# Patient Record
Sex: Female | Born: 1935
Health system: Southern US, Community
[De-identification: ages and names within clinical notes are randomized; demographics above are authoritative.]

## PROBLEM LIST (undated history)

## (undated) DIAGNOSIS — I10 Essential (primary) hypertension: Secondary | ICD-10-CM

## (undated) DIAGNOSIS — E785 Hyperlipidemia, unspecified: Secondary | ICD-10-CM

## (undated) DIAGNOSIS — K5792 Diverticulitis of intestine, part unspecified, without perforation or abscess without bleeding: Secondary | ICD-10-CM

## (undated) DIAGNOSIS — Z5189 Encounter for other specified aftercare: Secondary | ICD-10-CM

## (undated) DIAGNOSIS — F09 Unspecified mental disorder due to known physiological condition: Secondary | ICD-10-CM

## (undated) DIAGNOSIS — Z9889 Other specified postprocedural states: Secondary | ICD-10-CM

## (undated) DIAGNOSIS — E119 Type 2 diabetes mellitus without complications: Secondary | ICD-10-CM

## (undated) DIAGNOSIS — R54 Age-related physical debility: Secondary | ICD-10-CM

## (undated) HISTORY — DX: Hyperlipidemia, unspecified: E78.5

## (undated) HISTORY — PX: ABDOMINAL HYSTERECTOMY: SHX81

## (undated) HISTORY — PX: BREAST REDUCTION SURGERY: SHX8

## (undated) HISTORY — PX: REFRACTIVE SURGERY: SHX103

## (undated) HISTORY — DX: Diverticulitis of intestine, part unspecified, without perforation or abscess without bleeding: K57.92

## (undated) HISTORY — PX: KNEE SURGERY: SHX244

## (undated) HISTORY — PX: COLON SURGERY: SHX602

## (undated) HISTORY — PX: APPENDECTOMY: SHX54

## (undated) HISTORY — DX: Essential (primary) hypertension: I10

---
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
## 1998-01-23 ENCOUNTER — Ambulatory Visit (HOSPITAL_COMMUNITY): Admission: RE | Admit: 1998-01-23 | Discharge: 1998-01-23 | Payer: Self-pay | Admitting: *Deleted

## 1998-01-26 ENCOUNTER — Ambulatory Visit (HOSPITAL_COMMUNITY): Admission: RE | Admit: 1998-01-26 | Discharge: 1998-01-26 | Payer: Self-pay | Admitting: *Deleted

## 1999-07-02 HISTORY — PX: REDUCTION MAMMAPLASTY: SUR839

## 2000-01-23 ENCOUNTER — Other Ambulatory Visit: Admission: RE | Admit: 2000-01-23 | Discharge: 2000-01-23 | Payer: Self-pay | Admitting: Obstetrics and Gynecology

## 2000-02-11 ENCOUNTER — Encounter: Admission: RE | Admit: 2000-02-11 | Discharge: 2000-02-11 | Payer: Self-pay | Admitting: Surgery

## 2000-02-11 ENCOUNTER — Encounter: Payer: Self-pay | Admitting: Surgery

## 2000-05-13 ENCOUNTER — Encounter: Admission: RE | Admit: 2000-05-13 | Discharge: 2000-05-13 | Payer: Self-pay | Admitting: Surgery

## 2000-05-13 ENCOUNTER — Encounter: Payer: Self-pay | Admitting: Surgery

## 2001-05-18 ENCOUNTER — Encounter: Admission: RE | Admit: 2001-05-18 | Discharge: 2001-05-18 | Payer: Self-pay | Admitting: Surgery

## 2001-05-18 ENCOUNTER — Encounter: Payer: Self-pay | Admitting: Surgery

## 2001-05-25 ENCOUNTER — Ambulatory Visit (HOSPITAL_COMMUNITY): Admission: RE | Admit: 2001-05-25 | Discharge: 2001-05-25 | Payer: Self-pay | Admitting: Surgery

## 2001-07-01 HISTORY — PX: ABDOMINAL HYSTERECTOMY: SHX81

## 2001-07-01 HISTORY — PX: KNEE SURGERY: SHX244

## 2001-07-14 ENCOUNTER — Other Ambulatory Visit: Admission: RE | Admit: 2001-07-14 | Discharge: 2001-07-14 | Payer: Self-pay | Admitting: Obstetrics and Gynecology

## 2001-12-28 ENCOUNTER — Encounter: Payer: Self-pay | Admitting: Surgery

## 2001-12-28 ENCOUNTER — Encounter: Admission: RE | Admit: 2001-12-28 | Discharge: 2001-12-28 | Payer: Self-pay | Admitting: Surgery

## 2002-06-07 ENCOUNTER — Encounter: Payer: Self-pay | Admitting: Surgery

## 2002-06-07 ENCOUNTER — Encounter: Admission: RE | Admit: 2002-06-07 | Discharge: 2002-06-07 | Payer: Self-pay | Admitting: Surgery

## 2003-05-31 ENCOUNTER — Inpatient Hospital Stay (HOSPITAL_COMMUNITY): Admission: AD | Admit: 2003-05-31 | Discharge: 2003-06-01 | Payer: Self-pay | Admitting: Cardiovascular Disease

## 2003-06-16 ENCOUNTER — Encounter: Admission: RE | Admit: 2003-06-16 | Discharge: 2003-06-16 | Payer: Self-pay | Admitting: Surgery

## 2004-03-16 ENCOUNTER — Encounter: Admission: RE | Admit: 2004-03-16 | Discharge: 2004-03-16 | Payer: Self-pay | Admitting: Surgery

## 2004-07-20 ENCOUNTER — Encounter: Admission: RE | Admit: 2004-07-20 | Discharge: 2004-07-20 | Payer: Self-pay | Admitting: Surgery

## 2004-12-09 ENCOUNTER — Inpatient Hospital Stay (HOSPITAL_COMMUNITY): Admission: EM | Admit: 2004-12-09 | Discharge: 2004-12-13 | Payer: Self-pay | Admitting: *Deleted

## 2005-01-21 ENCOUNTER — Encounter: Admission: RE | Admit: 2005-01-21 | Discharge: 2005-01-21 | Payer: Self-pay | Admitting: Surgery

## 2005-02-22 ENCOUNTER — Inpatient Hospital Stay (HOSPITAL_COMMUNITY): Admission: RE | Admit: 2005-02-22 | Discharge: 2005-03-05 | Payer: Self-pay | Admitting: Surgery

## 2005-02-22 ENCOUNTER — Encounter (INDEPENDENT_AMBULATORY_CARE_PROVIDER_SITE_OTHER): Payer: Self-pay | Admitting: Specialist

## 2005-08-06 ENCOUNTER — Encounter: Admission: RE | Admit: 2005-08-06 | Discharge: 2005-08-06 | Payer: Self-pay | Admitting: Family Medicine

## 2005-08-13 ENCOUNTER — Ambulatory Visit (HOSPITAL_BASED_OUTPATIENT_CLINIC_OR_DEPARTMENT_OTHER): Admission: RE | Admit: 2005-08-13 | Discharge: 2005-08-13 | Payer: Self-pay | Admitting: Obstetrics and Gynecology

## 2005-08-23 ENCOUNTER — Encounter: Admission: RE | Admit: 2005-08-23 | Discharge: 2005-08-23 | Payer: Self-pay | Admitting: Family Medicine

## 2005-11-15 ENCOUNTER — Encounter: Admission: RE | Admit: 2005-11-15 | Discharge: 2005-11-15 | Payer: Self-pay | Admitting: Surgery

## 2006-02-01 ENCOUNTER — Inpatient Hospital Stay (HOSPITAL_COMMUNITY): Admission: EM | Admit: 2006-02-01 | Discharge: 2006-02-05 | Payer: Self-pay | Admitting: Emergency Medicine

## 2006-02-27 ENCOUNTER — Encounter: Admission: RE | Admit: 2006-02-27 | Discharge: 2006-02-27 | Payer: Self-pay | Admitting: Gastroenterology

## 2006-06-09 ENCOUNTER — Ambulatory Visit (HOSPITAL_COMMUNITY): Admission: RE | Admit: 2006-06-09 | Discharge: 2006-06-09 | Payer: Self-pay | Admitting: Obstetrics and Gynecology

## 2006-06-12 ENCOUNTER — Ambulatory Visit (HOSPITAL_COMMUNITY): Admission: RE | Admit: 2006-06-12 | Discharge: 2006-06-12 | Payer: Self-pay | Admitting: Family Medicine

## 2006-08-21 ENCOUNTER — Encounter: Admission: RE | Admit: 2006-08-21 | Discharge: 2006-08-21 | Payer: Self-pay | Admitting: Surgery

## 2007-02-23 ENCOUNTER — Inpatient Hospital Stay (HOSPITAL_COMMUNITY): Admission: RE | Admit: 2007-02-23 | Discharge: 2007-02-28 | Payer: Self-pay | Admitting: Surgery

## 2007-02-23 HISTORY — PX: LAPAROSCOPIC INCISIONAL / UMBILICAL / VENTRAL HERNIA REPAIR: SUR789

## 2007-09-09 ENCOUNTER — Ambulatory Visit (HOSPITAL_COMMUNITY): Admission: RE | Admit: 2007-09-09 | Discharge: 2007-09-09 | Payer: Self-pay | Admitting: Family Medicine

## 2008-09-21 ENCOUNTER — Ambulatory Visit (HOSPITAL_COMMUNITY): Admission: RE | Admit: 2008-09-21 | Discharge: 2008-09-21 | Payer: Self-pay | Admitting: Family Medicine

## 2008-10-10 ENCOUNTER — Ambulatory Visit (HOSPITAL_COMMUNITY): Admission: RE | Admit: 2008-10-10 | Discharge: 2008-10-10 | Payer: Self-pay | Admitting: Family Medicine

## 2009-03-30 HISTORY — PX: EYE SURGERY: SHX253

## 2009-07-01 HISTORY — PX: COLON SURGERY: SHX602

## 2009-09-25 ENCOUNTER — Ambulatory Visit (HOSPITAL_COMMUNITY)
Admission: RE | Admit: 2009-09-25 | Discharge: 2009-09-25 | Payer: Self-pay | Source: Home / Self Care | Admitting: Family Medicine

## 2010-05-07 ENCOUNTER — Ambulatory Visit: Payer: Self-pay | Admitting: Cardiology

## 2010-07-22 ENCOUNTER — Encounter: Payer: Self-pay | Admitting: Family Medicine

## 2010-07-25 ENCOUNTER — Ambulatory Visit (HOSPITAL_COMMUNITY)
Admission: RE | Admit: 2010-07-25 | Discharge: 2010-07-25 | Payer: Self-pay | Source: Home / Self Care | Attending: Family Medicine | Admitting: Family Medicine

## 2010-09-07 ENCOUNTER — Other Ambulatory Visit: Payer: Self-pay | Admitting: Cardiology

## 2010-09-07 ENCOUNTER — Ambulatory Visit (INDEPENDENT_AMBULATORY_CARE_PROVIDER_SITE_OTHER): Payer: Medicare Other | Admitting: Cardiology

## 2010-09-07 DIAGNOSIS — I119 Hypertensive heart disease without heart failure: Secondary | ICD-10-CM

## 2010-09-07 DIAGNOSIS — Z79899 Other long term (current) drug therapy: Secondary | ICD-10-CM

## 2010-09-07 DIAGNOSIS — E789 Disorder of lipoprotein metabolism, unspecified: Secondary | ICD-10-CM

## 2010-09-07 LAB — LIPID PANEL
Cholesterol: 181 mg/dL (ref 0–200)
HDL: 43 mg/dL (ref >39)
Total CHOL/HDL Ratio: 4.2 Ratio

## 2010-09-07 LAB — COMPREHENSIVE METABOLIC PANEL
AST: 22 U/L (ref 0–37)
Alkaline Phosphatase: 96 U/L (ref 39–117)
BUN: 27 mg/dL — ABNORMAL HIGH (ref 6–23)
Glucose, Bld: 122 mg/dL — ABNORMAL HIGH (ref 70–99)
Sodium: 137 mEq/L (ref 135–145)
Total Bilirubin: 0.8 mg/dL (ref 0.3–1.2)
Total Protein: 7.5 g/dL (ref 6.0–8.3)

## 2010-09-28 ENCOUNTER — Telehealth: Payer: Self-pay | Admitting: Cardiology

## 2010-09-28 NOTE — Telephone Encounter (Signed)
Lab results given and mailed to patient

## 2010-09-28 NOTE — Telephone Encounter (Signed)
PT SAID NO ONE CALLED HER WITH LAB RESULTS FROM 09/07/10. PLACED CHART IN  YOUR BOX.

## 2010-10-09 ENCOUNTER — Other Ambulatory Visit (HOSPITAL_COMMUNITY): Payer: Self-pay | Admitting: Family Medicine

## 2010-10-09 DIAGNOSIS — Z139 Encounter for screening, unspecified: Secondary | ICD-10-CM

## 2010-10-18 ENCOUNTER — Ambulatory Visit (HOSPITAL_COMMUNITY)
Admission: RE | Admit: 2010-10-18 | Discharge: 2010-10-18 | Disposition: A | Discharge status: Home / Self Care | Payer: Medicare Other | Source: Ambulatory Visit | Attending: Family Medicine | Admitting: Family Medicine

## 2010-10-18 DIAGNOSIS — Z139 Encounter for screening, unspecified: Secondary | ICD-10-CM

## 2010-10-18 DIAGNOSIS — Z1231 Encounter for screening mammogram for malignant neoplasm of breast: Secondary | ICD-10-CM | POA: Insufficient documentation

## 2010-11-13 NOTE — Op Note (Signed)
[name], [name]               ACCOUNT NO.:  [id number]   MEDICAL RECORD NO.:  [id number]          PATIENT TYPE:  INP   LOCATION:  0007                         FACILITY:  [facility]   PHYSICIAN:  Sandria Bales. Ezzard Standing, M.D.  DATE OF BIRTH:  1931-11-08   DATE OF PROCEDURE:  02/23/2007  DATE OF DISCHARGE:                               OPERATIVE REPORT   PREOPERATIVE DIAGNOSIS:  Ventral incisional hernia.   POSTOPERATIVE DIAGNOSIS:  Two Ventral incisional hernias,  periumbilical  incision, 3 x 3 cm, and an infra-abdominal incision, 8 x 12 cm in size.  Multiple intraabdominal adhesions.   PROCEDURE:  Laparoscopic ventral hernia repair with a 20 x 25-cm piece  of Parietex mesh and adhesiolysis for about 45 minutes.   SURGEON:  Sandria Bales. Ezzard Standing, M.D.   FIRST ASSISTANT:  No first assistant.   ANESTHESIA:  General.   ESTIMATED BLOOD LOSS:  Minimal.   INDICATION FOR PROCEDURE:  [name] is a 77 year old white female who  is a patient of Dr. Lilyan Punt, who has had a sigmoid colectomy in  October 2006.  Unfortunately, she developed a postop wound infection and  subsequently developed a lower abdominal hernia and now comes for repair  of this abdominal hernia.  Potential complications include bleeding,  infection, nerve injury or recurrence of the hernia and I have been  through all this with her.  She also has a sister who died form a DVT  post knee surgery and we will make sure she is covered perioperative  with compression stockings and subcu heparin.   OPERATIVE NOTE:  The patient was placed in a supine position and given a  general endotracheal anesthetic.  She had PAS stockings in place, a  Foley catheter in place.  Her abdomen was prepped with Betadine solution  and sterilely draped.   A timeout was held to identify the patient and the procedure and she was  given a gram of Ancef at the initiation of the procedure.   The abdomen was accessed through the right upper quadrant with  an  Optiview trocar entered through the abdominal cavity without difficulty.  The abdomen was insufflated.  Three additional trocars were placed, two  5-mm on the right side and one on the left side.   The patient had omentum and bowel stuck through her anterior abdominal  wall and hernia and I probably spent about 45 minutes dissecting these  adhesions and taking them down.   I used only a knife without any hot cautery.  I visualized the bowel  after I had done the dissection and saw no evidence of any injury to the  small bowel which had been stuck up.  I did take photos and placed these  in the chart.   I then identified basically 2 hernia defects; she had 1 periumbilical  hernia, which was about 2.5 or 3 cm in diameter; this was a more typical  hernia with clean margins.  The lower part of her abdomen has more of  almost a diastasis of the fascia, but I think it was a true hernia; it  probably had about a length of about 12 cm and a width anywhere from 6-8  cm.   I then measured what I thought would be appropriate mesh to cover this  and it looked like I needed a mesh about 17 cm wide and probably about  25 cm long to fill this defect and I used the Parietex 20 x 25 cm.   I put 8 sutures in a clockwise fashion as holding sutures and actually  put 2 addtional sutures on the lower end because the hernia was more low-  based, so I used a total of 10 retention or holding sutures and I placed  the Parietex into the abdominal cavity.  I placed saline on the mesh  before placing in the abdominal cavity and oriented the cranial and  caudad portions.  I then used the EndoCatch suture passer to grab each  such through the abdominal wall and this was a 0 Novofil that I had tied  along the edge and I tied these down.  I then used a tacker and tacked  along the edges of the mesh.  I used actually a total of some 55 tacks.  I went along the edge of the mesh about every centimeter and I also   placed maybe half a dozen, 6-8 tacks more in the center to kind of hold  up this mesh to the anterior abdominal wall.  The mesh appeared to lay  flat, but I thought I had the hernias well covered.  I thought the edges  of the mesh were covered to prevent any hernia defects or defects around  the edge of the mesh.  I think desufflated the abdomen once and then  reinflated to make sure there was nothing that either bled or looks  unusual in the way it buckled or laid, which I did not think did.  I  then desufflated again and removed by trocars under direct  visualization.  The skin at each trocar site was closed with a 5-0  Monocryl suture.  I painted each wound with tincture of Benzoin and  steri-stripped it so she had 10 puncture wounds around the edge of the  mesh and then 4 additional incisions for her trocars.   She tolerated the procedure well.  We plan at least overnight  observation, again, depending on how she does.      Sandria Bales. Ezzard Standing, M.D.  Electronically Signed     DHN/MEDQ  D:  02/23/2007  T:  02/24/2007  Job:  [id number]   cc:   Lorin Picket A. Gerda Diss, MD  Fax: [phone]

## 2010-11-16 NOTE — H&P (Signed)
NAME:  [name], [name] NO.:  [id number]   MEDICAL RECORD NO.:  [id number]                   PATIENT TYPE:  INP   LOCATION:  4703                                 FACILITY:  [facility]   PHYSICIAN:  Vesta Mixer, M.D.              DATE OF BIRTH:  24-Sep-1931   DATE OF ADMISSION:  05/31/2003  DATE OF DISCHARGE:                                HISTORY & PHYSICAL   HISTORY OF PRESENT ILLNESS:  [name] is a 77 year old female with a recent  onset of hypertension.  She presented this morning with episodes of chest  pain, chest discomfort, and tachy palpitations.   [name] was previously seen by Dr. Patty Sermons several weeks ago.  She has  had newly diagnosed hypertension which has only been intermittently  controlled.  Yesterday she called saying that her blood pressure was quite  high.  We gave her some extra medicines and her blood pressure came down  slightly.  This morning, she has some episodes of chest tightness as well as  some chest palpitations.  She presented to the office and her symptoms were  largely relieved with sublingual nitroglycerin.  She is admitted for further  evaluation.   [name] has not had any specific cardiac problems.  She has never had any  symptoms of hypertension, and her blood pressure was only found to be  elevated several weeks ago.  She also admits that her glucose levels have  been high on occasion, but she has never been formerly diagnosed as having  diabetes mellitus.  She denies any previous episodes of chest pain or  shortness of breath.  She denies any PND or orthopnea.  She denies any  fever, cough, or sputum production.  She denies any PND, orthopnea, or  syncope.   CURRENT MEDICATIONS:  1. Toprol XL 25 mg a day.  2. Aspirin 81 mg a day.  3. Hyzaar 100 mg/12.5 mg a day.  4. Norvasc 5 mg a day starting yesterday.   ALLERGIES:  None.   PAST MEDICAL HISTORY:  1. Hypertension, recently diagnosed.  2.  Hyperglycemia.   SOCIAL HISTORY:  The patient works for [employer].  She has a  normal colonoscopy several years ago by Dr. Ezzard Standing.   FAMILY HISTORY:  Significant for hypertension and diabetes mellitus.   PHYSICAL EXAMINATION:  GENERAL:  She is a middle-aged female in no acute  distress.  She is alert and oriented x3, and her mood and affect are normal.  VITAL SIGNS:  Her blood pressure is 170/80 with a heart rate of 66, her  weight is 200 pounds.  HEENT:  Reveals 2+ carotids, she has no bruits, there is no JVD, no  thyromegaly.  LUNGS:  Clear to auscultation.  HEART:  Regular rate, S1 and S2, with no murmurs, rubs, or gallops.  ABDOMEN:  Good bowel sounds and is nontender.  EXTREMITIES:  She  has no cyanosis, clubbing, or edema.  NEUROLOGIC:  Nonfocal.   LABORATORY DATA:  Her EKG reveals normal sinus rhythm.  She has small  inferior Q-waves.  She has poor R-wave progression which is probably due to  lead placement.  I cannot exclude an anterior septal myocardial infarction.   [name] presents with some episodes of chest pain.  They were mostly  relieved by sublingual nitroglycerin here in the office today.  Because of  her symptoms and the accelerated nature of her symptoms I would like to  admit her to the hospital for initiation of nitroglycerin drip and a heparin  drip.  We will anticipate doing a heart catheterization tomorrow.  We have  discussed the risks of heart catheterization.  She understands and agrees to  proceed.                                                Vesta Mixer, M.D.    PJN/MEDQ  D:  05/31/2003  T:  05/31/2003  Job:  [id number]   cc:   Anselm Pancoast. Zachery Dakins, M.D.  [address]  [place]  [place] [address]  Fax: [phone]

## 2010-11-16 NOTE — Consult Note (Signed)
[name], [name] NO.:  [id number]   MEDICAL RECORD NO.:  [id number]          PATIENT TYPE:  INP   LOCATION:  1613                         FACILITY:  [facility]   PHYSICIAN:  Bernette Redbird, M.D.   DATE OF BIRTH:  1931/09/20   DATE OF CONSULTATION:  02/02/2006  DATE OF DISCHARGE:                                   CONSULTATION   REFERRING PHYSICIAN:  Ollen Gross. Carolynne Edouard, M.D.   Dr. Demetrius Charity. Carolynne Edouard, covering for Dr. 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   [name] has a history of type 2 diabetes and also underwent a left  hemicolectomy about a year ago for recurrent diverticulitis.  That procedure  was complicated by a MRSA wound infection and delayed healing with  requirement for debridement procedure subsequently.   She then did well until May of this year, when she was preparing to go to  [place] and she had the subacute onset of a bloated sensation which evolved  into rather diffuse abdominal pain, worse in the right lower quadrant of the  abdomen, definitely below the waistline.  She was treated with two rounds of  Cipro and Flagyl, each lasting a week and gradually improved.  She went on  her cruise uneventfully but a couple of weeks after returning, which is to  say about a month ago, she had another episode very similar to the first  one, albeit not as severe.  She took Cipro and Flagyl for a few days and had  a prompt resolution of symptoms and then did well until approximately 3 days  ago when the symptoms recurred, and by last night, it had gotten to the  point where it was difficult for her to walk around without discomfort and  she was admitted to the hospital by Dr. Carolynne Edouard, where her white count was  normal but she had diffuse tenderness on exam without peritoneal findings.   Each of  these episodes have been very similar in character.  They are not  associated with any fevers, chills or vomiting, although on occasion she has  had some slight nausea.  Last night, as the pain was increasing, she had a  couple of slightly soft bowel movements that were somewhat small in  character.  There was no frank diarrhea and there was no relief of the pain  with defecation.   The pain is fairly steady in character and as noted is rather generalized in  her abdomen but is centered in the right lower quadrant.  She puts herself  on a liquid diet when these spells occur.  She does not feel like eating,  although there is no obvious exacerbation of the symptoms by eating.   A CT scan obtained last night showed inflammatory changes, I believe in the  region of the cecum but I need to review those films because the official  report is not out.  At the  time of her previous CT scan, at the time of her  initial episode back in May, the patient had evidence of inflammatory  changes in the region of the cecum and terminal ileum.  It is not clear if  the current inflammatory change is in the exactly the same location or not.   Since admission, the patient has been observed without antibiotic therapy to  see how she does and her white count is actually gone from normal to an even  lower normal level (from 8500 to 5500), and she has remained without fever.   PAST MEDICAL HISTORY:  No known allergies, although PHENERGAN may make her  fidgety.   OUTPATIENT MEDICATIONS:  Include metformin, gemfibrozil, metoprolol, daily  aspirin, proplasmin, lisinopril, Cipro and Flagyl.   OPERATIONS:  Include a total abdominal hysterectomy and appendectomy.  The  above-mentioned left hemicolectomy (which, by the patient's report, did not  remove all of her diverticular disease), and the above-mentioned wound  debridement failure to heal with associated MRSA infection.   MEDICAL ILLNESSES:  Include diabetes,  hyperlipidemia, hypertension and the  above-mentioned recurrent diverticulitis.  Note that when the patient  surgery was done, it was done because of somewhat medically refractory and  recurrent disease rather than any specific complications.   HABITS:  Nonsmoker, nondrinker.   FAMILY HISTORY:  Gallbladder disease in her mother and in fact the patient  herself is known to have gallstones by CT scan.  Negative for colon cancer  or inflammatory bowel disease.   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  She retired about a year or  two ago and is now working as a Biomedical scientist, does Agricultural consultant work at [employer], and so forth.   REVIEW OF SYSTEMS:  The patient has finally regained her stamina over the  past month or two following her surgery a year ago.  She does not really  have any ongoing active GI symptoms such as loss of appetite, involuntary  weight loss, dysphagia symptoms, chronic abdominal pain, constipation,  diarrhea or rectal bleeding.   PHYSICAL EXAM:  Pertinent for rather diffuse and moderately impressive  abdominal tenderness without peritoneal findings.  The patient is afebrile.  She is anicteric and without evident pallor.  The chest is clear to  auscultation.  The heart is normal without gallops, rubs, murmurs, clicks or  arrhythmias.  The abdomen has sparse bowel sounds which are nonobstructive  in character.  No bruits are heard.  There is rather diffuse tenderness with  some voluntary guarding.  No rebound and no rigidity.  Rectal shows a  basically empty rectal ampulla with some mucoid brown stool residue which  was submitted to the lab for Hemoccult testing, the results of which are  pending at this time.   LABS:  White count was 8700 on admission and is at 5500 today, hemoglobin  following hydration is 11.8, platelets 229,000 with 61 polys, 28 lymphs, 9 monocytes, 2 eosinophils.   Chemistry panel shows normal glucose of 95,  normal renal function, normal electrolytes and on admission, albumin was 4.2  and all liver chemistries were within normal limits.   CT scan to be reviewed.  Previous CT showed gallstones and evidence of  inflammation in the terminal ileum and cecum.   IMPRESSION:  This is an interesting but very nonspecific picture of  recurring diffuse abdominal pain with inflammatory changes noted previously  on the CT scan in the same area as her pain, specifically, the region of the  terminal ileum and cecum.  She is also known to have gallstones which I  think are irrelevant to the current clinical presentation.  The condition  seems to have responded to antibiotic therapy in the past (versus  spontaneous resolution with coincidental antibiotic therapy), and truly  seemed to have resolved in between the episodes rather than having smolder  in between, at least in terms of clinical symptoms.  It is not clearly an  infectious process in that there is no fever or white count associated with  it, although low grade diverticulitis would certainly be possible.  So the  differential diagnosis in this condition would include cecal diverticulitis,  inflammatory bowel disease (or ulcerations of the terminal ileum and cecum  related to her aspirin exposure), versus localized obstruction or  inflammation perhaps due to adhesions.   RECOMMENDATIONS:  1.  Review current CT films.  2.  If it appears that the inflammatory process is proximal to the terminal      ileum, consider either small bowel capsule endoscopy or small-bowel      follow-through.  If the process involves the cecum or the terminal      ileum, we could still consider small bowel capsule endoscopy but we      would also want to consider colonoscopy in several weeks with the delay      due to the possibility of increased risk if indeed cecal diverticulitis      is present at this time.   We  appreciate very much the opportunity to have seen this pleasant patient  in consultation with you.           ______________________________  Bernette Redbird, M.D.     RB/MEDQ  D:  02/02/2006  T:  02/02/2006  Job:  [id number]   cc:   Sandria Bales. Ezzard Standing, M.D.  [address], [address]  [place]  [place] [address]

## 2010-11-16 NOTE — Op Note (Signed)
[name]. [facility]  Patient:    [name], [name] Visit Number: [id number] MRN: [id number]          Service Type: END Location: ENDO Attending Physician:  Andre Lefort Dictated by:   Sandria Bales. Ezzard Standing, M.D. Proc. Date: 05/25/01 Admit Date:  05/25/2001   CC:         Dr. Wende Crease   Operative Report  DATE OF BIRTH:  Dec 03, 1931.  PREOPERATIVE DIAGNOSIS:  Family history of colonic polyps.  POSTOPERATIVE DIAGNOSIS:  Scattered sigmoid diverticulosis.  No evidence of mucosal lesion.  PROCEDURE:  Flexible colonoscopy.  SURGEON:  Sandria Bales. Ezzard Standing, M.D.  ANESTHESIA:  50 mg Demerol, 5 mg Versed.  COMPLICATIONS:  None.  INDICATION FOR PROCEDURE:  Ms. Curran is a 77 year old white female recently whose brother was found to have multiple colonic polyps and now comes for colonic screening examination.  She has completed a GoLYTELY bowel prep at home.  DESCRIPTION OF PROCEDURE:  She presented to the endoscopy suite, was placed in the left lateral decubitus position.  She had an IV in her left hand.  She was monitored with an EKG, pulse oximetry, and blood pressure cuff.  She was on nasal O2.  She was given Versed 5 mg, Demerol 50 mg.  The flexible Olympus colonoscope was passed without difficulty around to her cecum.  The ileocecal valve was visualized.  The right colon was actually fairly short.  There was no mass or lesion of the right colon.  In the transverse colon and the left colon she did have a few scattered diverticula, maybe seven or eight of these in her sigmoid colon.  I saw no mucosal lesions or mass.  The scope was then drawn to the rectum and then retroflexed, and there was no mass or lesion.  She tolerated the procedure well.  Would recommend a follow-up of just screening sigmoidoscopy at five-year intervals unless she should have some other symptom or problem. Dictated by:   Sandria Bales. Ezzard Standing, M.D. Attending Physician:   Andre Lefort DD:  05/25/01 TD:  05/25/01 Job: [id number]

## 2010-11-16 NOTE — Op Note (Signed)
NAME:  [name], [name] NO.:  [id number]   MEDICAL RECORD NO.:  [id number]           PATIENT TYPE:   LOCATION:                               FACILITY:  [facility]   PHYSICIAN:  Shirley Friar, MDDATE OF BIRTH:  March 13, 1932   DATE OF PROCEDURE:  DATE OF DISCHARGE:                                 OPERATIVE REPORT   PROCEDURE:  Capsule endoscopy.   ATTENDING:  Shirley Friar, MD   INDICATIONS:  Abdominal pain, thickening of small bowel loops on CT scan.   FINDINGS:  1. Two areas, one in the jejunum and one in the ileum, concerning for      small bowel submucosal mass.  2. Nodular jejunal folds.  3. Edematous ileal folds.  4. Scattered erosions.   PLAN:  1. Consider CT enterography to further evaluate small bowel versus      surgical exploration.  2. Colonoscopy with terminal ileum intubation.      Shirley Friar, MD  Electronically Signed     VCS/MEDQ  D:  02/07/2006  T:  02/07/2006  Job:  [id number]   cc:   Fayrene Fearing L. Malon Kindle., M.D.  Bernette Redbird, M.D.  Ollen Gross. Vernell Morgans, M.D.

## 2010-11-16 NOTE — Op Note (Signed)
NAMEMEILY, GLOWACKI               ACCOUNT NO.:  [id number]   MEDICAL RECORD NO.:  [id number]          PATIENT TYPE:  INP   LOCATION:  1613                         FACILITY:  [facility]   PHYSICIAN:  Sandria Bales. Ezzard Standing, M.D.  DATE OF BIRTH:  1932-01-26   DATE OF PROCEDURE:  02/22/2005  DATE OF DISCHARGE:                                 OPERATIVE REPORT   PREOPERATIVE DIAGNOSIS:  Recurrent sigmoid colon diverticulitis.   POSTOPERATIVE DIAGNOSIS:  Sigmoid colon diverticulitis with inflammatory  mass in mid sigmoid colon with small bowel adhered to this mass.   PROCEDURE:  Laparoscopic-assisted sigmoid colectomy (mobilization of splenic  flexure).   SURGEON:  Sandria Bales. Ezzard Standing, M.D.   ASSISTANT:  Sharlet Salina T. Hoxworth, M.D.   ANESTHESIA:  General endotracheal.   ESTIMATED BLOOD LOSS:  200 cc.   DRAINS:  None.   INDICATIONS FOR PROCEDURE:  Ms. Riebe is a 77 year old white female who has  had recurrent bouts of diverticulitis.  Most recently, she was hospitalized  at [facility] from the 11th to the 15th of June with acute  diverticulitis.  Discussion carried out about proceeding with colon  resection for surgical treatment of her diverticular disease.   The indications, potential complications are explained to the patient.  Potential complications include, but are not limited to, bleeding,  infection, bowel leak and recurrent diverticular disease.   The patient completed a mechanical and antibiotic bowel prep at home and now  come to the hospital for attempted sigmoid colectomy.  We will do this  laparoscopically, and it appears that we will at least have to go up part of  her left colon to achieve a pretty good excision of her diverticular  disease.   OPERATIVE NOTE:  The patient  was placed in a lithotomy position with her arms draped to her  side.  NG tube in place.  Foley catheter in place.  She had preoperative  subcu heparin and  IV antibiotics.   Abdomen was prepped  with betadine solution and  draped to access her  abdomen.   I accessed the peritoneal cavity through an infraumbilical incision and  placed a 12 mm Hasson trocar.  I then placed four additional 5 mm trocars,  one in the left lower quadrant, one in the right lower quadrant, one in the  left upper quadrant, and one in the right upper quadrant.  I first started  dissection with the sigmoid colon, and I took part of this down; however,  she had at least two loops of small bowel densely adherent to the sigmoid  colon.  I realized there would be a limited amount of how I could do this  dissection, in view of her significantly inflamed sigmoid colon, the locally  attached small bowel.   I then turned my attention up to the left colon, which I took down  the  splenic flexure, and the left transverse colon, which I took down with a  harmonic scalpel.  I went to almost the mid transverse colon, mobilized the  splenic flexure down to the level of the umbilicus.  At this point, I felt I had good mobilization of the splenic flexure, good  mobilization of the left colon.  She had a densely adherent mass along the  left pelvic brim, consistent with diverticular disease.  I did not think I  could approach the inflamed mass  laparoscopically; therefore, I went  through a lower midline incision down to the abdominal cavity.   I packed off the bowel using the self-retaining retractor, a Balfour  retractor.  I then explored the small bowel.  Again, she had these dense  adhesions of the small bowel to the sigmoid colon that I took down.  I then  mobilized the sigmoid colon off the pelvic brim.  I did identify the left  ureter, which is posterior to our dissection.  I went down towards the  peritoneal reflection about 3 cm proximal to the peritoneal reflection,  which I thought was beyond her diverticular disease and divided her sigmoid  colon at that level.  I then came up the entire sigmoid colon back  to the  junction of the sigmoid colon and the left colon and divided the proximal  sigmoid colon.  I took down the mesentery of the colon using both harmonic  scalpel and 2-0 silk ties.   I labeled the sigmoid specimen proximally with a suture to mark this and  sent it to pathology.  I then carried out an end-to-end hand-sewn  anastomosis using 2-0 silk sutures, bringing the distal sigmoid colon down  to the peritoneal reflection in the pelvis.  The anastomosis was then  completed.  It admitted at least a fingerbreadth to a fingerbreadth and a  half through the anastomosis.  I then irrigated the abdomen with 3 liters of  saline.  I closed the mesenteric defect with a single 2-0 silk suture.  After irrigating, I then placed Tisseel around the anastomosis.  I returned  the small bowel to its location, checked the NG tube, which was in good  position, palpated the right and left lobe of the liver, which were  unremarkable.  There is no other palpable mass or abnormality within her  abdomen.   She tolerated the procedure well.  Sponge and needle counts were correct at  the end of the case.  I then closed her abdomen with two running #1 PDS  sutures.  I closed the skin with a skin gun and then staple-closed the four  trocar sites.   She did very well from a surgery standpoint.      Sandria Bales. Ezzard Standing, M.D.  Electronically Signed     DHN/MEDQ  D:  02/22/2005  T:  02/22/2005  Job:  [id number]   cc:   Lorin Picket A. Gerda Diss, MD  [address]  [place]  [place] [address]  Fax: [phone]   Cassell Clement, M.D.  [address]  [place]  [place] [address]  Fax: [phone]

## 2010-11-16 NOTE — Op Note (Signed)
NAME:  MARNITA, POIRIER               ACCOUNT NO.:  [id number]   MEDICAL RECORD NO.:  [id number]          PATIENT TYPE:  AMB   LOCATION:  ENDO                         FACILITY:  [facility]   PHYSICIAN:  Sandria Bales. Ezzard Standing, M.D.  DATE OF BIRTH:  07/04/1931   DATE OF PROCEDURE:  06/09/2006  DATE OF DISCHARGE:                               OPERATIVE REPORT   PREOPERATIVE DIAGNOSIS:  Recurrent abdominal pain, status post sigmoid  colectomy for diverticulitis.   POSTOPERATIVE DIAGNOSIS:  Normal upper endoscopy with esophagogastric  junction at about 41 cm.  Rare colonic diverticula with normal terminal  ileum and colonic anastomosis at 20 cm from anal verge.   PROCEDURE:  Upper esophagogastroduodenoscopy and flexible colonoscopy.   SURGEON:  Dr. Ezzard Standing.   ANESTHESIA:  Was a total of 50 mg of Demerol, 6 mg of Versed.   COMPLICATIONS:  None.   DETAILS OF PROCEDURE:  [name] is a 77 year old white female who  underwent a laparoscopic assisted sigmoid colectomy on February 23, 2005.  She has had several bouts of abdominal pain, one requiring a  hospitalization in August 2007.  The pain at times has been in the right  lower quadrant and felt to possible cecal or terminal ileum  inflammation.  Another time, she has had pain in the left lower  quadrant.  This is etiology is unclear.  She has undergone a capsule  endoscopy, which raised the question of small bowel inflammation.  She  seen Dr. Nadine Counts Buccini from a GI consult.  She now comes for upper  endoscopy and colonoscopy to make sure there is no other occult problems  to be identified.   The patient completed a Half-LYTEly bowel prep at home, presented to the  [facility].   OPERATIVE NOTE:  Started with her upper endoscopy first.  She had a IV in her right  wrist, she had nasal oxygen, she had a blood pressure cuff, telemetry  and pulse oximetry on.   The back of her throat was anesthetized with Cetacaine to remove her gag  reflex.  She was then given initially 4 mg of Versed, 50 mg Demerol and  a flexible Pentax.  The Pentax upper endoscope was passed without  difficulty down into her stomach.   I cannulated her duodenum to the second portion without difficulty.  The  villa of her first and second portion of her duodenum looked normal.  The scope was withdrawn into the stomach and her stomach gastric rugal  folds all within normal limits.  There was no evidence of any gastritis  or ulcer disease.  I as able to retroflex the scope and see the  gastroesophageal junction and the cardia in a retroflex fashion.  Again,  she had what appeared to be is totally normal anatomy without mass,  lesion or nodularity.  The scope was then withdrawn to the  esophagogastric junction, which was measured at 41 cm from her incisors.  She had no evidence of esophagitis and her esophageal looked to be  normal.   The bed was turned for a colonoscopy.  The patient was  given an  additional 2 mg of Versed for a total of 6 mg of Versed during the  procedure.  The Pentax adult colonoscope was then passed up her rectum  without difficulty.  She experienced some discomfort on advancing the  scope, but I was able to advance the scope all the around to her cecum  and visualize appendiceal orifice and her ileocecal valve.  I was able  to cannulate into the terminal ileum and probably got up about 5 to 7  cm, but saw normal small bowel mucosa in the terminal ileum.   The scope was withdrawn into the cecum and in visualizing the cecum,  right colon, transverse colon and left colon and other than maybe 1 or 2  scattered diverticula I saw no evidence of inflammation, no mucosal  lesion, no cause for recurrent pain.  Her anastomosis was visualized at  20 cm and there was still some suture material with some evidence of  granulation tissue at the anastomosis, but again no suspicious mass or  inflammation.  The was scope was then withdrawn  into the rectum, the  scope retroflexed within the rectum and again was unremarkable.  A  digital rectal exam was also unremarkable.   I took photos of her terminal ileum, her cecum, her left colon and her  anastomosis and these are included in her chart at the hospital, and I  gave her copies.   I found really no either upper endoscopic or colonoscopic reason for  recurrent abdominal pain.  She has now gone about three months without  having any symptoms and we will observe her from here.  She actually is  suppose to Dr. Lilyan Punt later this week.      Sandria Bales. Ezzard Standing, M.D.  Electronically Signed     DHN/MEDQ  D:  06/09/2006  T:  06/10/2006  Job:  [id number]   cc:   Lorin Picket A. Gerda Diss, MD  Bernette Redbird, M.D.

## 2010-11-16 NOTE — H&P (Signed)
[name], [name]               ACCOUNT NO.:  [id number]   MEDICAL RECORD NO.:  [id number]          PATIENT TYPE:  INP   LOCATION:  1613                         FACILITY:  [facility]   PHYSICIAN:  Sandria Bales. Ezzard Standing, M.D.  DATE OF BIRTH:  Dec 04, 1931   DATE OF ADMISSION:  02/22/2005  DATE OF DISCHARGE:  03/05/2005                                HISTORY & PHYSICAL   HISTORY OF PRESENT ILLNESS:  This is a 77 year old white female who is a  retired Engineer, civil (consulting) from my office and a patient of Dr. Simone [name] and in  [place], Dr. Cassell [name], who has had recurrent bouts with severe  diverticulitis.  She was hospitalized at [facility] from June 11-  15 with acute diverticulitis, which responded to antibiotics.  She has had  several bouts since that time and now comes for attempted laparoscopic-  assisted sigmoid colectomy.   PAST MEDICAL HISTORY:  She has no allergies.   CURRENT MEDICATIONS:  1.  Pravastatin 20 mg daily.  2.  Metformin 1000 mg b.i.d.  3.  Norvasc 5 mg daily.  4.  Metoprolol 5 mg daily.  5.  Gemfibrozil 600 mg b.i.d.  6.  Baby aspirin 81 mg daily.  7.  Hyzaar 100/25 daily.  8.  Centrum Silver.  9.  Tums Extra Strength.   REVIEW OF SYSTEMS:  Significant in that she has a history of non-insulin-  dependent diabetes mellitus.  She has had hypertension.   PHYSICAL EXAMINATION:  GENERAL:  She is a well-nourished, pleasant white  female, alert and cooperative on physical exam.  HEENT:  Unremarkable.  Her mouth shows no obvious oral lesion.  NECK:  Supple.  I feel no mass.  LYMPH NODES:  She has no supraclavicular or axillary adenopathy.  LUNGS:  Clear to auscultation.  HEART:  Regular rate and rhythm.  ABDOMEN:  Soft.  She has a tenderness to her left lower quadrant, which she  has had off and on since her diverticular disease, but she has no abdominal  mass.  I feel she has no hernia.  EXTREMITIES:  She has good strength in all four extremities.  NEUROLOGIC:  Grossly intact.   Her admission labs show a white blood cell count of 6000, hemoglobin 13.1,  hematocrit 38.6.  Her sodium 142, potassium 5.5, chloride 105, CO2 29,  glucose 136, BUN 22, creatinine 0.8, calcium 10.2.   ADMISSION DIAGNOSES:  1.  Recurrent sigmoid diverticulitis.  Patient has completed a mechanical      and antibiotic bowel prep and now comes for attempted sigmoid colectomy      with laparoscopic assistance.  Patient understands the indications and      potential risks, both of surgery and the resection of her colon.      Potential risks include but are not limited to bleeding, infection,      bowel leak, hernia, recurrent diverticular disease.  2.  Diabetes mellitus.  3.  Hypertension.  4.  Elevated cholesterol.      Sandria Bales. Ezzard Standing, M.D.  Electronically Signed     DHN/MEDQ  D:  05/02/2005  T:  05/02/2005  Job:  [id number]   cc:   Donna Bernard, M.D.  Fax: [phone]   [name], M.D.  Fax: [phone]

## 2010-11-16 NOTE — Cardiovascular Report (Signed)
NAME:  [name], [name] NO.:  [id number]   MEDICAL RECORD NO.:  [id number]                   PATIENT TYPE:  INP   LOCATION:  4703                                 FACILITY:  [facility]   PHYSICIAN:  Vesta Mixer, M.D.              DATE OF BIRTH:  09-03-1931   DATE OF PROCEDURE:  06/01/2003  DATE OF DISCHARGE:                              CARDIAC CATHETERIZATION   [name] is a 77 year old female with recent diagnosis of hypertension. She  also has some hyperglycemia.  She started having episodes of chest pain  yesterday which were partially relieved with sublingual nitroglycerin.  She  was admitted to the hospital and then was referred for heart  catheterization.   PROCEDURE:  Left heart catheterization with coronary angiography.   The right femoral artery was easily cannulated using the modified Seldinger  technique.   HEMODYNAMICS:  The left ventricular pressure was 121/22 with a aortic  pressure of 119/47.   CORONARY ANGIOGRAPHY:  1. Left main coronary artery was smooth and normal.  2. The left anterior descending artery is smooth and normal.  It gives off     several moderate sized diagonal branches all of which are smooth and     normal.  The LAD reaches around the apex and supplies the inferior apical     wall.  3. The left circumflex artery is a fairly large vessel.  It gives off a     large obtuse marginal branch which is smooth and normal.  4. The right coronary artery is large and dominant.  It is smooth and normal     throughout its course.  The posterior descending artery and the posterior     lateral segment arteries are normal.   LEFT VENTRICULOGRAM:  Left ventriculogram was performed was a 30 RAO  position.  It reveals normal left ventricular systolic function.  The  ejection fraction is between 60-65%.  There is no mitral regurgitation.   COMPLICATIONS:  None.   CONCLUSIONS:  1. Smooth and normal coronary arteries.  2. Normal  left ventricular systolic function.   We will continue with medical therapy for her hypertension and she will also  need to have hypoglycemic/diabetes mellitus addressed.                                               Vesta Mixer, M.D.    PJN/MEDQ  D:  06/01/2003  T:  06/01/2003  Job:  [id number]   cc:   Sandria Bales. Ezzard Standing, M.D.  [address]  [place]  [place] [address]  Fax: [phone]

## 2010-11-16 NOTE — Op Note (Signed)
NAME:  [name], SLAPE               ACCOUNT NO.:  [id number]   MEDICAL RECORD NO.:  [id number]          PATIENT TYPE:  AMB   LOCATION:  DSC                          FACILITY:  [facility]   PHYSICIAN:  Sandria Bales. Ezzard Standing, M.D.  DATE OF BIRTH:  February 13, 1932   DATE OF PROCEDURE:  08/13/2005  DATE OF DISCHARGE:                                 OPERATIVE REPORT   PREOPERATIVE DIAGNOSIS:  Two Persistent draining sinus: one in lower midline  incision below umbilicus and one immediately below the umbilicus.   POSTOPERATIVE DIAGNOSIS:  Chronic draining sinus with fistula tract X 2.   PROCEDURE:  Excision of fistula tract lower abdominal wall, approximately 2  cm incision, and then immediately below the umbilicus approximately 1 cm  incision.   SURGEON:  Sandria Bales. Ezzard Standing, M.D.   ANESTHESIA:  Xylocaine 1%, 35 mL, with epinephrine.   COMPLICATIONS:  None.   INDICATIONS FOR PROCEDURE:  [name] is a 77 year old white female who had  a sigmoid colon resection for diverticular disease in about August of 2006.  She has had a chronic recurrent draining sinus tract of her lower midline  incision.  One tract is about midway between her umbilicus and her pubis,  the other tract is immediately below her umbilicus and neither have healed.  I have probed these multiple times in the office without getting any  successful foreign body removal.  I closed her fascia with #1 PDS, which I  would expect it to be dissolved by now.  So, she now comes for attempt at  excising these tracts.    Operative Note:  The patient placed in a supine position.  Her abdomen prepped with Betadine  solution and sterilely draped.  I infiltrated the skin around these tracts  with 1% Xylocaine, using about 35 mL.  I injected both tracts with methylene  blue, but I am not really sure how well that lit up the tract.   I then followed both tracts.  The one in the lower tract actually went down  towards her pelvis, kind of undermining  the skin about 3-4 cm.  So, I opened  the skin up over this so this would be easier to pack.  I got down to the  bottom of the tract, I really found no foreign bodies in it, but I think I  excised the entire tract, and then in the upper incision below the umbilicus  I did the same, but that tract was not quite as deep, maybe 3 cm deep.  Actually, I worried about maybe even a little hole in the fascia, so I put a  single 2-0 Vicryl suture through a hole in the fascia which may be no  more than 6 or 7 mm in diameter.  So, she has a stitch of Vicryl in the  bottom of this.  I packed both with Betadine solution.  We will arrange home  health care for her to take care of this at home with twice a day dressing  changes.  She will see me back in 1 week for followup.  She  knows to call  for any problem.      Sandria Bales. Ezzard Standing, M.D.  Electronically Signed     DHN/MEDQ  D:  08/13/2005  T:  08/13/2005  Job:  [id number]

## 2010-11-16 NOTE — Discharge Summary (Signed)
[name], [name]               ACCOUNT NO.:  [id number]   MEDICAL RECORD NO.:  [id number]          PATIENT TYPE:  INP   LOCATION:  1611                         FACILITY:  [facility]   PHYSICIAN:  Sandria Bales. Ezzard Standing, M.D.  DATE OF BIRTH:  05/21/1932   DATE OF ADMISSION:  12/09/2004  DATE OF DISCHARGE:  12/13/2004                                 DISCHARGE SUMMARY   DISCHARGE DIAGNOSES:  1.  Acute  and recurrent diverticulitis.  2.  Hypertension.  3.  Non-insulin-dependent diabetes mellitus.   HISTORY OF PRESENT ILLNESS:  [name] is a 77 year old female, who is an  employee in our office, who has had a history of recurrent sigmoid colon  diverticulitis.  Prior to this admission she had been treated as an  outpatient with Cipro and Flagyl but had increasing abdominal pain of the 12-  18 hours prior to admission.   She denied any nausea or vomiting.  She had a CT scan in the emergency room  which was consistent with an acute diverticulitis, but there was no evidence  of perforation or abscess.   PAST MEDICAL HISTORY:  Treated for hypertension and type 2 diabetes mellitus  on oral hypoglycemics.   PAST SURGICAL HISTORY:  Her prior abdominal operations included abdominal  hysterectomy and appendectomy in the past.  She has also had bilateral  reduction mammoplasty and right knee arthroscopy.   PHYSICAL EXAMINATION:  On admission temperature 98, blood pressure 162/84,  heart rate 104.  Her admission white blood count was 15,600, hemoglobin 14.1  hematocrit 42.  Her sodium is 137, potassium 4.0, chloride 98, CO2 28,  glucose 185.  Her creatinine was 0.9, calcium 10.1.  Hemoglobin A1c 7.0.   She was admitted, placed on IV antibiotics, kept n.p.o. and was placed on IV  Cipro and Flagyl.   She had some mild elevation of her blood sugars while in the hospital.  The  first hospital day her glucose hit 170 but actually got better.  Her glucose  became under better control.   By the 4th  postoperative day she was afebrile.  Her white blood count was  7000, her hemoglobin was 10.  She was doing much better but still had some  very mild left lower quadrant tenderness.   She was ready for discharge.  She will be seen back in the office for  discussion of possible sigmoid colon surgery that she has had multiple  attacks at home.  This has probably been the most severe that she has had.  She will be given 10 more days of Flagyl and Cipro at the time of discharge.  She will be on clear liquids for 2 days then advance her diet.  She will see  me in the office in 2-3 weeks.       DHN/MEDQ  D:  01/22/2005  T:  01/22/2005  Job:  [id number]   cc:   Lorin Picket A. Gerda Diss, MD  [address]  [place]  [place] [address]  Fax: [phone]   Cassell Clement, M.D.  [phone]  [place]  [place] [address]  Fax: [phone]

## 2010-11-16 NOTE — H&P (Signed)
NAMEJENEE, [name]               ACCOUNT NO.:  [id number]   MEDICAL RECORD NO.:  [id number]          PATIENT TYPE:  EMS   LOCATION:  ED                           FACILITY:  [facility]   PHYSICIAN:  Vikki Ports, MDDATE OF BIRTH:  October 01, 1931   DATE OF ADMISSION:  12/09/2004  DATE OF DISCHARGE:                                HISTORY & PHYSICAL   ADMISSION DIAGNOSIS:  Acute diverticulitis.   ADMITTING PHYSICIAN:  1.  Vikki Ports, MD.  2.  Sandria Bales. Ezzard Standing, M.D.   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  The patient has been  treated as an outpatient with Cipro and Flagyl for the last 6 days for left  lower quadrant abdominal pain.  The patient's pain worsened over the last 12  to 16 hours.  The patient also began complaining of feeling flushed and  nauseated.  She denies nausea and vomiting.  She did have a seminormal bowel  movement last p.m.  She continues to pass gas.  CT scan in the emergency  room was consistent with acute diverticulitis but no evidence of perforation  or abscess.   PAST MEDICAL HISTORY:  Significant for hypertension, non-insulin-dependent  diabetes mellitus.   PAST SURGICAL HISTORY:  Significant for a total abdominal hysterectomy and  appendectomy in the remote past, bilateral reduction mammoplasty and right  knee arthroscopy.   MEDICATIONS:  1.  Metformin.  2.  Norvasc.  3.  Toprol.  4.  Hyzaar.  5.  Cipro.  6.  Flagyl.   REVIEW OF SYSTEMS:  Significant for nausea, questionable fevers.  No chills.   PHYSICAL EXAMINATION:  GENERAL APPEARANCE:  She is an age-appropriate white  female in mild distress.  VITAL SIGNS:  Temperature is 98, blood pressure 162/84, heart rate 104.  HEENT:  Exam is benign.  Normocephalic, atraumatic.  Pupils are equal, round  and reactive to light.  NECK:  Supple and soft without thyromegaly or cervical adenopathy.  LUNGS:  Clear  to auscultation and percussion times two.  HEART:  Regular rate and rhythm without murmurs, rubs or gallops and a  normal PMI.  ABDOMEN:  Soft with a well-healed, low vertical midline scar and right lower  quadrant scar.  She is quite tender in the left lower quadrant to minimal  palpation.  It is positive for rebound.  RECTAL:  Exam is deferred at this time.  EXTREMITIES:  Exam shows normal gait and station.  No clubbing, cyanosis or  edema.   Labs show a white count of 15,000, hemoglobin of 14.1 and normal  electrolytes except a glucose of 185.   IMPRESSION:  Acute diverticulitis, refractory to p.o. outpatient medical  management.   PLAN:  Admission.  IV antibiotics.  A clear liquid diet.       KRH/MEDQ  D:  12/09/2004  T:  12/09/2004  Job:  [id number]

## 2010-11-16 NOTE — Discharge Summary (Signed)
NAMESHALEE, PAOLO               ACCOUNT NO.:  [id number]   MEDICAL RECORD NO.:  [id number]          PATIENT TYPE:  INP   LOCATION:  1613                         FACILITY:  [facility]   PHYSICIAN:  Clovis Pu. Cornett, M.D.DATE OF BIRTH:  1932/05/18   DATE OF ADMISSION:  02/22/2005  DATE OF DISCHARGE:                                 DISCHARGE SUMMARY   ADMISSION DIAGNOSES:  Sigmoid diverticulitis.   DISCHARGE DIAGNOSIS:  Sigmoid diverticulitis.   PROCEDURE:  Laparoscopic-assisted sigmoid colectomy.   BRIEF HISTORY:  She is a 77 year old female who had recurrent bouts of  diverticulitis.  She has been hospitalized numerous occasions and the most  recent was back in June, 2006.  She was admitted for elective sigmoid  colectomy for recurrent bouts of acute diverticulitis.   HOSPITAL COURSE:  The patient underwent a laparoscopic-assisted sigmoid  colectomy on 02/22/05.  Postoperatively, she did well except for  complications of MRSA wound infection.  The wound had to be opened and  packed and treated with antibiotics.  Other than this, her progression was  standard.  Her diet was advanced over the next 2-3 days and her bowel  function returned at that time.  Her wounds looked better on February 26, 2005  wound care.  She had mild ileus postoperatively that slowly got better.  By  postoperative day 7 she was passing flatus.  Her diet was advanced at this  point in time and she was discharged home on postoperative day number 9.   DISCHARGE INSTRUCTIONS:  The patient will be discharged home on Vicodin for  pain as well as Phenergan for nausea.  She will also return to her home  medications as before.  She will follow up with Dr. Ezzard Standing on Tuesday,  September 5.  There will be wound care instructions which include dressing  changes of her abdominal wound 2-3 times a day.  She will have home health  do this for her.  The patient discharged in satisfactory.      Thomas A. Cornett, M.D.  Electronically Signed     TAC/MEDQ  D:  03/03/2005  T:  03/03/2005  Job:  [id number]

## 2010-11-16 NOTE — Discharge Summary (Signed)
NAMECAYMAN, [name]               ACCOUNT NO.:  [id number]   MEDICAL RECORD NO.:  [id number]          PATIENT TYPE:  INP   LOCATION:  1527                         FACILITY:  [facility]   PHYSICIAN:  Sandria Bales. Ezzard Standing, M.D.  DATE OF BIRTH:  [birth year]   DATE OF ADMISSION:  02/23/2007  DATE OF DISCHARGE:  02/28/2007                               DISCHARGE SUMMARY   DISCHARGE DIAGNOSES:  1. Lower abdominal ventral incisional hernia.  2. Diabetes mellitus.  3. Hypertension.  4. History of diverticulosis.  5. Hypercholesterolemia.  6. Family history of pulmonary embolism.   OPERATIONS PERFORMED:  The patient had a laparoscopic repair of ventral  incisional hernia on 02/23/07.   HISTORY OF PRESENT ILLNESS:  This is a 77 year old white female who is a  patient of Dr. Lilyan Punt, who had a laparoscopic-assisted  sigmoid  colectomy in October of 2006. Postoperatively she developed a wound  infection which was positive for MRSA. Her wound eventually healed.  However, she developed an abdominal hernia at the wound infection site.   She otherwise has been doing well. Her underlying medical conditions  include  1. Diabetes mellitus on oral hypoglycemics.  2. Hypertension.  3. Hypercholesterolemia.   Of note, she also had a sister who died post knee surgery from a DVT.   HOSPITAL COURSE:  The patient's preop labs included a hemoglobin of  12.4, hematocrit of 36.5, white blood count of 5,800. Her sodium was  142, potassium 5.2, chloride of 107, glucose of 165, creatinine of 0.76.   She was taken to the operating room on the day of admission where she  underwent a laparoscopic ventral hernia repair with Parietex mesh.  Actually she had 2 hernias, 1 about 8. x 12 cm and the second one around  her umbilicus about 3 x 3 cm. I used a piece of Parietex approximately  20 x 25 cm and did an enterolysis of small bowel adhesions to the  anterior abdominal wall.   Postoperatively she did well. Her  first postoperative day her sodium was  139, potassium 4.5, creatinine of 0.7. her glucoses ran in the 100s. She  was placed on a sliding scale. She was kept NPO for a few days and  started on clear liquids and then her diet advanced.   By the fifth postoperative days she was tolerating a diet. She was  passing flatus. Her blood sugars were below 140. Her abdomen was soft  and she was ready for discharge.   DISCHARGE INSTRUCTIONS:  Included that she could eat a regular diet. She  is to shower after discharge. She was given Vicodin for pain and she was  to resume her home medication.  She would follow up with Dr. Ezzard Standing in 2  to 3 weeks.   CONDITION ON DISCHARGE:  Good.      Sandria Bales. Ezzard Standing, M.D.  Electronically Signed     DHN/MEDQ  D:  03/10/2007  T:  03/10/2007  Job:  [id number]   cc:   Lorin Picket A. Gerda Diss, MD  Fax: [phone]

## 2010-11-16 NOTE — H&P (Signed)
[name], [name] NO.:  [id number]   MEDICAL RECORD NO.:  [id number]          PATIENT TYPE:  INP   LOCATION:  1613                         FACILITY:  [facility]   PHYSICIAN:  Ollen Gross. Vernell Morgans, M.D. DATE OF BIRTH:  08-27-1931   DATE OF ADMISSION:  02/01/2006  DATE OF DISCHARGE:                                HISTORY & PHYSICAL   HISTORY OF THE PRESENT ILLNESS:  [name] is a 77 year old white female who  presents tonight with lower abdominal pain that started on Thursday.  She  had one episode of pain similar to this in the last year.  At that time she  was diagnosed with typhlitis and was placed on Cipro and Flagyl, and it got  better.  She has not really had any nausea or vomiting, but does feel  bloated.  She has passed some flatus in the last day or so and also had  bowel movements, but these have been more narrow than normal.  She has not  ran any fevers.   The patient did have a left colectomy last year by Dr. Ezzard Standing for  diverticulitis.  She otherwise denies any chest pain, shortness of breath,  diarrhea or dysuria.   REVIEW OF SYSTEMS:  The rest of the review of systems is unremarkable.   PAST MEDICAL HISTORY:  The patient's past medical history is significant  for:  1. Diabetes.  2. Hyperlipidemia.  3. Hypertension.  4. Diverticulitis.   PAST SURGICAL HISTORY:  The past surgical history is significant for:  1. Hysterectomy.  2. Left colectomy.   MEDICATIONS:  The patient's medications include metformin, gemfibrozil,  metoprolol, aspirin, proplasmin, lisinopril, Cipro, and Flagyl.   ALLERGIES:  No known drug allergies.   SOCIAL HISTORY:  The patient denies any use of alcohol or tobacco products.   FAMILY HISTORY:  The family history is noncontributory.   PHYSICAL EXAMINATION:  VITAL SIGNS:  On physical exam her temp is 97.3,  blood pressure 163/69 and pulse 66.  GENERAL APPEARANCE:  In general this is an elderly white female in no acute  distress.  SKIN:  The skin is warm and dry with no jaundice.  HEENT:  Extraocular muscles are intact.  Pupils are equal, round and react  to light.  Sclerae are nonicteric.  LUNGS:  The lungs are clear bilaterally with no use of accessory respiratory  muscles.  HEART:  The heart has a regular rate and rhythm with an impulse in the left  chest.  She seems to have a systolic murmur.  ABDOMEN:  The abdomen is soft, but tender in the right and left lower  quadrants.  No peritonitis.  No palpable mass or hepatosplenomegaly.  EXTREMITIES:  There is no cyanosis, clubbing or edema.  NEUROLOGIC EXAMINATION:  There is good strength in the arms and legs.  PSYCHIATRIC:  The patient is alert and oriented times three with no stigmata  of anxiety or depression.   LABORATORY DATA:  On review of her lab work it was noted that she had a  normal white count of 8,700 with no shift.  Her electrolytes are pending.   ASSESSMENT AND PLAN:  This is a  77 year old white female with some lower  abdominal pain.  Given her previous surgery I think that most likely this  might be a partial bowel obstruction from some adhesions.  It does not seem  to be infectious at this point with no fever and normal white count.   Plan:  1. We will plan to admit her to the hospital for close observation.  2. We will also obtain a CT scan tonight to look for the source of her      pain and we will discuss this with Dr. Ezzard Standing on Monday.      Ollen Gross. Vernell Morgans, M.D.  Electronically Signed     PST/MEDQ  D:  02/01/2006  T:  02/02/2006  Job:  [id number]

## 2010-11-16 NOTE — Discharge Summary (Signed)
NAMEDALESHA, STANBACK               ACCOUNT NO.:  [id number]   MEDICAL RECORD NO.:  [id number]          PATIENT TYPE:  INP   LOCATION:  1613                         FACILITY:  [facility]   PHYSICIAN:  Sandria Bales. Ezzard Standing, M.D.  DATE OF BIRTH:  29-Mar-1932   DATE OF ADMISSION:  02/01/2006  DATE OF DISCHARGE:  02/05/2006                                 DISCHARGE SUMMARY   DISCHARGE DIAGNOSES:  1. Cecal inflammation, questionable diverticulitis.  2. Status post sigmoid colectomy for diverticulitis.  3. History of diabetes mellitus.  4. History of hypertension.  5. Hyperlipidemia.   OPERATIONS PERFORMED:  None.   HISTORY OF PRESENT ILLNESS:  Ms. Griego is a 77 year old white female who  presented with lower abdominal pain.  She stated she had a couple of  episodes.  She underwent a left colectomy in August of 2006 for recurrent  diverticulits.  She presented acutely with lower abdominal pain, a normal  white blood count, and a CT scan that shows some wall thickening and  inflammatory changes in the left side of her colon and near her cecum.  The  patient was admitted by Dr. Carolynne Edouard, made n.p.o., was continued on home  medicine.   She was seen in consultation by Dr. Nadine Counts Buccini who thought she was  moderately tender and worries whether she has cecal diverticulitis,  inflammatory bowel disease, or aspirin-induced ileitis.   She improved while in the hospital.  She was given a capsule endoscopy whose  final type report is not a part of the chart.  On February 05, 2006, she was  feeling much better.  She was afebrile.  She was ready for discharge and  discharged home resuming on her home medications.  She sees Dr. Matthias Hughs back  in two weeks.  Call for any interval problem with me.   An addendum would that her capsule endoscopy showed two areas, one in the  jejunum and one in the ileum concerning for small bowel mucosal mass.  She  had nodular jejunal folds, edematous ileal folds as revealed by Dr.  Shirley Friar.  He suggested considering a CT enterography versus surgical  exploration.      Sandria Bales. Ezzard Standing, M.D.  Electronically Signed     DHN/MEDQ  D:  03/05/2006  T:  03/06/2006  Job:  [id number]   cc:   Bernette Redbird, M.D.  Fax: [phone]

## 2010-11-16 NOTE — Discharge Summary (Signed)
NAME:  [name], [name]                         ACCOUNT NO.:  [id number]   MEDICAL RECORD NO.:  [id number]                   PATIENT TYPE:  INP   LOCATION:  4703                                 FACILITY:  [facility]   PHYSICIAN:  Vesta Mixer, M.D.              DATE OF BIRTH:  June 16, 1932   DATE OF ADMISSION:  05/31/2003  DATE OF DISCHARGE:  06/01/2003                                 DISCHARGE SUMMARY   ADMISSION DIAGNOSES:  1. Noncardiac chest pain.  2. Hypertension.   DISCHARGE MEDICATIONS:  1. Toprol XL 25 mg daily.  2. Aspirin 81 mg daily.  3. Hyzaar 100mg  /12.5 mg daily.  4. Norvasc 5 mg daily.   DISPOSITION:  The patient will return to see Dr. Elease Hashimoto in one week or so.  She is to follow up with her medical doctor as needed.   HISTORY OF PRESENT ILLNESS:  The patient is a 77 year old female who was  admitted through the office with some episodes of chest pain relieved with  nitroglycerin.  Please see the dictated history and physical examination for  further details.   HOSPITAL COURSE:  PROBLEM #1:  CHEST PAIN:  The patient had a heart  catheterization which revealed relatively smooth and normal coronary  arteries.  She had no left ventricular systolic function.  She was  discharged later that day and did quite well.  She did not have any further  episodes of chest pain.  She is encouraged to follow up with her medical  doctor for further evaluation of this noncardiac chest pain.  All of her  other medical problems are stable.                                                Vesta Mixer, M.D.    PJN/MEDQ  D:  07/06/2003  T:  07/06/2003  Job:  [id number]   cc:   Cassell Clement, M.D.  [address], [address]  [place]  [place] [address]  Fax: [phone]

## 2011-02-06 ENCOUNTER — Encounter: Payer: Self-pay | Admitting: *Deleted

## 2011-02-13 ENCOUNTER — Encounter: Payer: Self-pay | Admitting: *Deleted

## 2011-02-13 ENCOUNTER — Other Ambulatory Visit: Payer: Self-pay | Admitting: Cardiology

## 2011-02-13 DIAGNOSIS — E785 Hyperlipidemia, unspecified: Secondary | ICD-10-CM

## 2011-02-13 DIAGNOSIS — I10 Essential (primary) hypertension: Secondary | ICD-10-CM

## 2011-02-15 ENCOUNTER — Ambulatory Visit (INDEPENDENT_AMBULATORY_CARE_PROVIDER_SITE_OTHER): Payer: Medicare Other | Admitting: Cardiology

## 2011-02-15 ENCOUNTER — Other Ambulatory Visit (INDEPENDENT_AMBULATORY_CARE_PROVIDER_SITE_OTHER): Payer: Medicare Other | Admitting: *Deleted

## 2011-02-15 ENCOUNTER — Encounter: Payer: Self-pay | Admitting: Cardiology

## 2011-02-15 VITALS — BP 136/70 | HR 72 | Wt 170.0 lb

## 2011-02-15 DIAGNOSIS — I700 Atherosclerosis of aorta: Secondary | ICD-10-CM | POA: Insufficient documentation

## 2011-02-15 DIAGNOSIS — E1151 Type 2 diabetes mellitus with diabetic peripheral angiopathy without gangrene: Secondary | ICD-10-CM | POA: Insufficient documentation

## 2011-02-15 DIAGNOSIS — E119 Type 2 diabetes mellitus without complications: Secondary | ICD-10-CM

## 2011-02-15 DIAGNOSIS — I119 Hypertensive heart disease without heart failure: Secondary | ICD-10-CM

## 2011-02-15 DIAGNOSIS — I10 Essential (primary) hypertension: Secondary | ICD-10-CM

## 2011-02-15 DIAGNOSIS — E7800 Pure hypercholesterolemia, unspecified: Secondary | ICD-10-CM

## 2011-02-15 DIAGNOSIS — E785 Hyperlipidemia, unspecified: Secondary | ICD-10-CM

## 2011-02-15 LAB — LIPID PANEL
Cholesterol: 176 mg/dL (ref 0–200)
Total CHOL/HDL Ratio: 4
Triglycerides: 151 mg/dL — ABNORMAL HIGH (ref 0.0–149.0)

## 2011-02-15 LAB — HEPATIC FUNCTION PANEL
ALT: 16 U/L (ref 0–35)
Total Protein: 7.9 g/dL (ref 6.0–8.3)

## 2011-02-15 LAB — BASIC METABOLIC PANEL
CO2: 29 mEq/L (ref 19–32)
Chloride: 104 mEq/L (ref 96–112)
Creatinine, Ser: 1 mg/dL (ref 0.4–1.2)

## 2011-02-15 NOTE — Progress Notes (Signed)
[name] Date of Birth:  1932/01/29 [facility] [address]   [address] [place], [place]  [address] [phone]           Fax   [phone]  HPI: This pleasant 77 year old woman comes in for a followup office visit.  She has a history of essential hypertension and hypercholesterolemia.  He does not have any history of known ischemic heart disease per his last visit she has been feeling well.  She is diabetic but has not been having any hypoglycemic episodes.  She denies chest pain or shortness of breath.  She stays physically active and has been very busy working in the family garden this summer  Current Outpatient Prescriptions  Medication Sig Dispense Refill  . amLODipine (NORVASC) 10 MG tablet Take 10 mg by mouth daily.        Marland Kitchen aspirin 81 MG tablet Take 81 mg by mouth daily.        . calcium carbonate (TUMS - DOSED IN MG ELEMENTAL CALCIUM) 500 MG chewable tablet Chew 1 tablet by mouth daily.        Marland Kitchen CINNAMON PO Take 1 capsule by mouth daily.        Marland Kitchen gemfibrozil (LOPID) 600 MG tablet Take 600 mg by mouth 2 (two) times daily before a meal.        . losartan-hydrochlorothiazide (HYZAAR) 100-25 MG per tablet Take 1 tablet by mouth daily.        . metFORMIN (GLUCOPHAGE) 1000 MG tablet Take 1,000 mg by mouth 2 (two) times daily with a meal.        . multivitamin (THERAGRAN) per tablet Take 1 tablet by mouth daily.        . Omega-3 Fatty Acids (FISH OIL PO) Take 1 capsule by mouth daily.        . Red Yeast Rice Extract (RED YEAST RICE PO) Take 1 capsule by mouth 2 (two) times daily.          Allergies  Allergen Reactions  . Prednisone     rash    There is no problem list on file for this patient.   History  Smoking status  . Never Smoker   Smokeless tobacco  . Never Used    History  Alcohol Use No    Family History  Problem Relation Age of Onset  . Cancer Mother     ovaian  . Hypertension Father     kidney  . Stroke Father   .  Cancer Father   . Diabetes Brother   . Hypertension Sister     Review of Systems: The patient denies any heat or cold intolerance.  No weight gain or weight loss.  The patient denies headaches or blurry vision.  There is no cough or sputum production.  The patient denies dizziness.  There is no hematuria or hematochezia.  The patient denies any muscle aches or arthritis.  The patient denies any rash.  The patient denies frequent falling or instability.  There is no history of depression or anxiety.  All other systems were reviewed and are negative.   Physical Exam: Filed Vitals:   02/15/11 1002  BP: 136/70  Pulse: 72   The general appearance feels a well-developed well-nourished woman in no distress.The head and neck exam reveals pupils equal and reactive.  Extraocular movements are full.  There is no scleral icterus.  The mouth and pharynx are normal.  The neck is supple.  The carotids reveal no bruits.  The jugular venous pressure is normal.  The  thyroid is not enlarged.  There is no lymphadenopathy.  The chest is clear to percussion and auscultation.  There are no rales or rhonchi.  Expansion of the chest is symmetrical.  The precordium is quiet.  The first heart sound is normal.  The second heart sound is physiologically split.  There is no murmur gallop rub or click.  There is no abnormal lift or heave.  The abdomen is soft and nontender.  The bowel sounds are normal.  The liver and spleen are not enlarged.  There are no abdominal masses.  There are no abdominal bruits.  Extremities reveal good pedal pulses.  There is no phlebitis or edema.  There is no cyanosis or clubbing.  Strength is normal and symmetrical in all extremities.  There is no lateralizing weakness.  There are no sensory deficits.  The skin is warm and dry.  There is no rash.     Assessment / Plan: Await results of lab work.  Continue same meds.  Recheck in 4 months for followup office visit and lab work

## 2011-02-15 NOTE — Assessment & Plan Note (Signed)
The patient is on metformin for her diabetes.  She's not having any hypoglycemic episodes

## 2011-02-15 NOTE — Assessment & Plan Note (Signed)
The patient is onGemfibrozil for her hypercholesterolemia.  She did not tolerate statins.  Blood work today is pending

## 2011-02-18 ENCOUNTER — Telehealth: Payer: Self-pay | Admitting: *Deleted

## 2011-02-18 NOTE — Telephone Encounter (Signed)
Mailed copy to patient.  

## 2011-02-18 NOTE — Telephone Encounter (Signed)
Message copied by Eugenia Pancoast on Mon Feb 18, 2011  9:37 AM ------      Message from: Cassell Clement      Created: Sat Feb 16, 2011  7:40 PM       Liver good.  BS and lipids better.  CSD

## 2011-04-12 LAB — CBC
MCHC: 33.9
MCV: 91
Platelets: 286

## 2011-04-12 LAB — URINALYSIS, ROUTINE W REFLEX MICROSCOPIC
Ketones, ur: NEGATIVE
Nitrite: POSITIVE — AB
Protein, ur: NEGATIVE
Urobilinogen, UA: 0.2

## 2011-04-12 LAB — COMPREHENSIVE METABOLIC PANEL
AST: 23
Albumin: 4.2
CO2: 27
Calcium: 10
Creatinine, Ser: 0.76
GFR calc Af Amer: >60
GFR calc non Af Amer: >60

## 2011-04-12 LAB — BASIC METABOLIC PANEL
BUN: 14
CO2: 30
Chloride: 102
Creatinine, Ser: 0.71
GFR calc Af Amer: >60
Potassium: 4.5

## 2011-04-12 LAB — DIFFERENTIAL
Eosinophils Relative: 2
Lymphocytes Relative: 33
Lymphs Abs: 1.9
Monocytes Absolute: 0.4
Neutro Abs: 3.3

## 2011-06-19 ENCOUNTER — Ambulatory Visit: Payer: 59 | Admitting: Cardiology

## 2011-06-19 ENCOUNTER — Other Ambulatory Visit: Payer: 59 | Admitting: *Deleted

## 2011-07-23 ENCOUNTER — Ambulatory Visit (INDEPENDENT_AMBULATORY_CARE_PROVIDER_SITE_OTHER): Payer: Medicare Other | Admitting: Cardiology

## 2011-07-23 ENCOUNTER — Encounter: Payer: Self-pay | Admitting: Cardiology

## 2011-07-23 ENCOUNTER — Other Ambulatory Visit (INDEPENDENT_AMBULATORY_CARE_PROVIDER_SITE_OTHER): Payer: Medicare Other | Admitting: *Deleted

## 2011-07-23 DIAGNOSIS — E7800 Pure hypercholesterolemia, unspecified: Secondary | ICD-10-CM

## 2011-07-23 DIAGNOSIS — E119 Type 2 diabetes mellitus without complications: Secondary | ICD-10-CM

## 2011-07-23 DIAGNOSIS — I119 Hypertensive heart disease without heart failure: Secondary | ICD-10-CM

## 2011-07-23 LAB — LIPID PANEL
Cholesterol: 170 mg/dL (ref 0–200)
VLDL: 27.2 mg/dL (ref 0.0–40.0)

## 2011-07-23 LAB — HEPATIC FUNCTION PANEL
AST: 23 U/L (ref 0–37)
Albumin: 4.4 g/dL (ref 3.5–5.2)
Alkaline Phosphatase: 92 U/L (ref 39–117)
Total Protein: 7.6 g/dL (ref 6.0–8.3)

## 2011-07-23 LAB — BASIC METABOLIC PANEL
BUN: 26 mg/dL — ABNORMAL HIGH (ref 6–23)
CO2: 26 mEq/L (ref 19–32)
GFR: 63.56 mL/min (ref >60.00)
Glucose, Bld: 131 mg/dL — ABNORMAL HIGH (ref 70–99)
Potassium: 4.1 mEq/L (ref 3.5–5.1)

## 2011-07-23 NOTE — Assessment & Plan Note (Signed)
The patient is intolerant of statins but is on gemfibrozil for hypercholesterolemia.  Blood work today is pending.  She's not having any side effects from the gemfibrozil.

## 2011-07-23 NOTE — Progress Notes (Signed)
[name] Date of Birth:  [birth year] [facility] [address] [place], [place]  [address] [phone]  Fax   [phone]  HPI: This pleasant 78 year old woman is seen for a scheduled followup visit.  She has a history of high blood pressure and hypercholesterolemia and diabetes.  Since last visit she has been feeling well with no new cardiac symptoms.  She denies any chest pain or shortness of breath.  Her blood pressure has been running a little higher at home than usual over the past several weeks and she has gained 3 pounds since we last saw her.  Current Outpatient Prescriptions  Medication Sig Dispense Refill  . amLODipine (NORVASC) 10 MG tablet Take 10 mg by mouth daily.        Marland Kitchen aspirin 81 MG tablet Take 81 mg by mouth daily.        Marland Kitchen CINNAMON PO Take 1 capsule by mouth daily.        Marland Kitchen gemfibrozil (LOPID) 600 MG tablet Take 600 mg by mouth 2 (two) times daily before a meal.        . losartan-hydrochlorothiazide (HYZAAR) 100-25 MG per tablet Take 1 tablet by mouth daily.        . metFORMIN (GLUCOPHAGE) 1000 MG tablet Take 1,000 mg by mouth 2 (two) times daily with a meal.        . multivitamin (THERAGRAN) per tablet Take 1 tablet by mouth daily.        . Omega-3 Fatty Acids (FISH OIL PO) Take 1 capsule by mouth daily.        . Red Yeast Rice Extract (RED YEAST RICE PO) Take 1 capsule by mouth 2 (two) times daily.          Allergies  Allergen Reactions  . Prednisone     rash    Patient Active Problem List  Diagnoses  . Hypercholesterolemia  . Diabetes mellitus  . Benign hypertensive heart disease without heart failure    History  Smoking status  . Never Smoker   Smokeless tobacco  . Never Used    History  Alcohol Use No    Family History  Problem Relation Age of Onset  . Cancer Mother     ovaian  . Hypertension Father     kidney  . Stroke Father   . Cancer Father   . Diabetes Brother   . Hypertension Sister     Review of  Systems: The patient denies any heat or cold intolerance.  No weight gain or weight loss.  The patient denies headaches or blurry vision.  There is no cough or sputum production.  The patient denies dizziness.  There is no hematuria or hematochezia.  The patient denies any muscle aches or arthritis.  The patient denies any rash.  The patient denies frequent falling or instability.  There is no history of depression or anxiety.  All other systems were reviewed and are negative.   Physical Exam: Filed Vitals:   07/23/11 1019  BP: 130/72  Pulse: 60   The general appearance reveals a well-developed well-nourished woman in no distress.The head and neck exam reveals pupils equal and reactive.  Extraocular movements are full.  There is no scleral icterus.  The mouth and pharynx are normal.  The neck is supple.  The carotids reveal no bruits.  The jugular venous pressure is normal.  The  thyroid is not enlarged.  There is no lymphadenopathy.  The chest is clear to  percussion and auscultation.  There are no rales or rhonchi.  Expansion of the chest is symmetrical.  The precordium is quiet.  The first heart sound is normal.  The second heart sound is physiologically split.  There is no murmur gallop rub or click.  There is no abnormal lift or heave.  The abdomen is soft and nontender.  The bowel sounds are normal.  The liver and spleen are not enlarged.  There are no abdominal masses.  There are no abdominal bruits.  Extremities reveal good pedal pulses.  There is no phlebitis or edema.  There is no cyanosis or clubbing.  Strength is normal and symmetrical in all extremities.  There is no lateralizing weakness.  There are no sensory deficits.  The skin is warm and dry.  There is no rash.     Assessment / Plan:  Continue same medication.  Lose about 5 pounds before next visit.  Watch dietary salt.  During the winter when she cannot exercise outside she will need to start going to the local community center to  exercise inside.  Recheck here in 4 months for followup office visit and fasting lab work.  The patient requests that we send a copy of today's labs to her and she will take it to her primary care physician.

## 2011-07-23 NOTE — Assessment & Plan Note (Signed)
Blood pressure was high when she first arrived.  I checked it later in the examination it was down to 130/72.  Patient states that her blood pressure is high early in the morning at home but is normal the rest of the day after she takes her medication.  She's not having any headaches or dizzy spells.

## 2011-07-23 NOTE — Patient Instructions (Signed)
Your physician wants you to follow-up in: 4 months You will receive a reminder letter in the mail two months in advance. If you don't receive a letter, please call our office to schedule the follow-up appointment.     .Your physician recommends that you continue on your current medications as directed. Please refer to the Current Medication list given to you today.  

## 2011-08-29 DIAGNOSIS — E119 Type 2 diabetes mellitus without complications: Secondary | ICD-10-CM | POA: Diagnosis not present

## 2011-09-13 DIAGNOSIS — H9209 Otalgia, unspecified ear: Secondary | ICD-10-CM | POA: Diagnosis not present

## 2011-10-01 ENCOUNTER — Other Ambulatory Visit: Payer: Self-pay | Admitting: Family Medicine

## 2011-10-01 DIAGNOSIS — Z139 Encounter for screening, unspecified: Secondary | ICD-10-CM

## 2011-10-09 DIAGNOSIS — M1710 Unilateral primary osteoarthritis, unspecified knee: Secondary | ICD-10-CM | POA: Diagnosis not present

## 2011-10-18 DIAGNOSIS — M1710 Unilateral primary osteoarthritis, unspecified knee: Secondary | ICD-10-CM | POA: Diagnosis not present

## 2011-10-22 ENCOUNTER — Ambulatory Visit (HOSPITAL_COMMUNITY)
Admission: RE | Admit: 2011-10-22 | Discharge: 2011-10-22 | Disposition: A | Discharge status: Home / Self Care | Payer: Medicare Other | Source: Ambulatory Visit | Attending: Family Medicine | Admitting: Family Medicine

## 2011-10-22 DIAGNOSIS — Z1231 Encounter for screening mammogram for malignant neoplasm of breast: Secondary | ICD-10-CM | POA: Diagnosis not present

## 2011-10-22 DIAGNOSIS — Z139 Encounter for screening, unspecified: Secondary | ICD-10-CM

## 2011-10-28 ENCOUNTER — Ambulatory Visit (INDEPENDENT_AMBULATORY_CARE_PROVIDER_SITE_OTHER): Payer: Self-pay | Admitting: Surgery

## 2011-10-28 ENCOUNTER — Ambulatory Visit (INDEPENDENT_AMBULATORY_CARE_PROVIDER_SITE_OTHER): Payer: Self-pay

## 2011-10-31 DIAGNOSIS — M1710 Unilateral primary osteoarthritis, unspecified knee: Secondary | ICD-10-CM | POA: Diagnosis not present

## 2011-11-04 DIAGNOSIS — N390 Urinary tract infection, site not specified: Secondary | ICD-10-CM | POA: Diagnosis not present

## 2011-11-19 ENCOUNTER — Ambulatory Visit (INDEPENDENT_AMBULATORY_CARE_PROVIDER_SITE_OTHER): Payer: Medicare Other | Admitting: Surgery

## 2011-11-19 ENCOUNTER — Ambulatory Visit (INDEPENDENT_AMBULATORY_CARE_PROVIDER_SITE_OTHER): Payer: Self-pay

## 2011-11-19 VITALS — BP 178/79 | HR 86 | Temp 97.3°F | Resp 16 | Ht 68.0 in | Wt 166.4 lb

## 2011-11-19 DIAGNOSIS — D485 Neoplasm of uncertain behavior of skin: Secondary | ICD-10-CM | POA: Diagnosis not present

## 2011-11-19 DIAGNOSIS — L989 Disorder of the skin and subcutaneous tissue, unspecified: Secondary | ICD-10-CM

## 2011-11-19 DIAGNOSIS — D229 Melanocytic nevi, unspecified: Secondary | ICD-10-CM

## 2011-11-19 DIAGNOSIS — D492 Neoplasm of unspecified behavior of bone, soft tissue, and skin: Secondary | ICD-10-CM

## 2011-11-19 NOTE — Progress Notes (Signed)
[facility]  Ovidio Kin, MD,  FACS [address]  [address] [place], [place]    [address] Phone:  [phone] FAX:  [phone]   Re:   [name] DOB:   01/07/1932 MRN:   [id number]  ASSESSMENT AND PLAN: 1.  Mole right lower leg, suspicious.  Excised.  To follow up for suture removal in 13-14 days  HISTORY OF PRESENT ILLNESS: Chief Complaint  Patient presents with  . Other    office surgery/mole removal    [name] is a 78 y.o. (DOB: 04/30/1932)  female who is a patient of LUKING,SCOTT, MD, MD and comes to me today for a suspicious lesion on her right leg.   PHYSICAL EXAM: BP 178/79  Pulse 86  Temp(Src) 97.3 F (36.3 C) (Temporal)  Resp 16  Ht 5\' 8"  (1.727 m)  Wt 166 lb 6.4 oz (75.479 kg)  BMI 25.30 kg/m2  PROCEDURE:  Photo was taken.  The lesion on the right lower leg was about 5 - 6 mm.  The area was prepped with betadine.  Infiltrated with about 3 cc of 1% xylocaine.  The lesion was excised and sent to pathology.  The skin was closed with 3 4-0 nylon sutures. She will see [place] back in about 2 weeks for suture removal.   Ovidio Kin, MD, FACS Office:  [phone]

## 2011-11-27 ENCOUNTER — Ambulatory Visit (INDEPENDENT_AMBULATORY_CARE_PROVIDER_SITE_OTHER): Payer: Medicare Other | Admitting: Cardiology

## 2011-11-27 ENCOUNTER — Encounter: Payer: Self-pay | Admitting: Cardiology

## 2011-11-27 VITALS — BP 114/64 | HR 64 | Ht 68.0 in | Wt 167.8 lb

## 2011-11-27 DIAGNOSIS — L989 Disorder of the skin and subcutaneous tissue, unspecified: Secondary | ICD-10-CM | POA: Diagnosis not present

## 2011-11-27 DIAGNOSIS — E7800 Pure hypercholesterolemia, unspecified: Secondary | ICD-10-CM

## 2011-11-27 DIAGNOSIS — I119 Hypertensive heart disease without heart failure: Secondary | ICD-10-CM

## 2011-11-27 DIAGNOSIS — E119 Type 2 diabetes mellitus without complications: Secondary | ICD-10-CM

## 2011-11-27 LAB — LIPID PANEL
Cholesterol: 179 mg/dL (ref 0–200)
HDL: 47.9 mg/dL (ref >39.00)
LDL Cholesterol: 104 mg/dL — ABNORMAL HIGH (ref 0–99)
Total CHOL/HDL Ratio: 4
Triglycerides: 137 mg/dL (ref 0.0–149.0)
VLDL: 27.4 mg/dL (ref 0.0–40.0)

## 2011-11-27 LAB — HEPATIC FUNCTION PANEL
Albumin: 4 g/dL (ref 3.5–5.2)
Alkaline Phosphatase: 86 U/L (ref 39–117)
Total Protein: 7.1 g/dL (ref 6.0–8.3)

## 2011-11-27 LAB — BASIC METABOLIC PANEL
CO2: 29 mEq/L (ref 19–32)
Chloride: 104 mEq/L (ref 96–112)
Creatinine, Ser: 0.9 mg/dL (ref 0.4–1.2)
Potassium: 4.5 mEq/L (ref 3.5–5.1)
Sodium: 141 mEq/L (ref 135–145)

## 2011-11-27 NOTE — Progress Notes (Signed)
Quick Note:  Please report to patient. The recent labs are stable. Continue same medication and careful diet.Send copy to patient. ______

## 2011-11-27 NOTE — Assessment & Plan Note (Signed)
Earlier this week she had an excision of a skin lesion from her leg which showed some abnormal cells but was not melanoma

## 2011-11-27 NOTE — Assessment & Plan Note (Signed)
Patient has not been having any hypoglycemic symptoms.  She reports at her primary care physician recently cut back on her metformin slightly so she now takes 500 in the morning and the thousand in the evening

## 2011-11-27 NOTE — Progress Notes (Signed)
[name] Date of Birth:  1931/12/17 [facility] [address] [place], [place]  [address] [phone]  Fax   [phone]  HPI: This pleasant 78 year old woman is seen for a scheduled followup office visit.  She has been feeling well since last visit.  She has a history of high blood pressure, hypercholesterolemia, and diabetes.  She's had no new symptoms since last visit.  Current Outpatient Prescriptions  Medication Sig Dispense Refill  . amLODipine (NORVASC) 10 MG tablet Take 10 mg by mouth daily.        Marland Kitchen aspirin 81 MG tablet Take 81 mg by mouth daily.        Marland Kitchen CINNAMON PO Take 1 capsule by mouth daily.        Marland Kitchen gemfibrozil (LOPID) 600 MG tablet Take 600 mg by mouth 2 (two) times daily before a meal.        . losartan-hydrochlorothiazide (HYZAAR) 100-25 MG per tablet Take 1 tablet by mouth daily.        . metFORMIN (GLUCOPHAGE) 1000 MG tablet Take 1,000 mg by mouth 2 (two) times daily with a meal. Take 500 mg in AM and 1,000 mg in PM.      . multivitamin (THERAGRAN) per tablet Take 1 tablet by mouth daily.        . Omega-3 Fatty Acids (FISH OIL PO) Take 1 capsule by mouth daily.        . Red Yeast Rice Extract (RED YEAST RICE PO) Take 1 capsule by mouth 2 (two) times daily.          Allergies  Allergen Reactions  . Prednisone     rash    Patient Active Problem List  Diagnoses  . Hypercholesterolemia  . Diabetes mellitus  . Benign hypertensive heart disease without heart failure  . Skin lesion of right leg    History  Smoking status  . Never Smoker   Smokeless tobacco  . Never Used    History  Alcohol Use No    Family History  Problem Relation Age of Onset  . Cancer Mother     ovaian  . Hypertension Father     kidney  . Stroke Father   . Cancer Father   . Diabetes Brother   . Hypertension Sister     Review of Systems: The patient denies any heat or cold intolerance.  No weight gain or weight loss.  The patient denies  headaches or blurry vision.  There is no cough or sputum production.  The patient denies dizziness.  There is no hematuria or hematochezia.  The patient denies any muscle aches or arthritis.  The patient denies any rash.  The patient denies frequent falling or instability.  There is no history of depression or anxiety.  All other systems were reviewed and are negative.   Physical Exam: Filed Vitals:   11/27/11 1023  BP: 114/64  Pulse: 64   the general appearance reveals a well-developed well-nourished woman in no distress.The head and neck exam reveals pupils equal and reactive.  Extraocular movements are full.  There is no scleral icterus.  The mouth and pharynx are normal.  The neck is supple.  The carotids reveal no bruits.  The jugular venous pressure is normal.  The  thyroid is not enlarged.  There is no lymphadenopathy.  The chest is clear to percussion and auscultation.  There are no rales or rhonchi.  Expansion of the chest is symmetrical.  The precordium is  quiet.  The first heart sound is normal.  The second heart sound is physiologically split.  There is no murmur gallop rub or click.  There is no abnormal lift or heave.  The abdomen is soft and nontender.  The bowel sounds are normal.  The liver and spleen are not enlarged.  There are no abdominal masses.  There are no abdominal bruits.  Extremities reveal good pedal pulses.  There is no phlebitis or edema.  There is no cyanosis or clubbing.  Strength is normal and symmetrical in all extremities.  There is no lateralizing weakness.  There are no sensory deficits.  The skin is warm and dry.  There is no rash.      Assessment / Plan: Continue same medication.  Recheck in 4 months for followup office visit lipid panel hepatic function panel and basal metabolic panel.  The patient requests that we send a copy of the labs to her and she will get the results to her primary care physician

## 2011-11-27 NOTE — Assessment & Plan Note (Signed)
The patient is intolerant of statins but is on gemfibrozil.  We are checking blood work today.  Since last visit she has lost 6 pounds and has been more physically active.

## 2011-11-27 NOTE — Patient Instructions (Signed)
Will send you a copy of your labs  Your physician recommends that you schedule a follow-up appointment in: 4 months with fasting labs (lp/bmet/hfp)   Your physician recommends that you continue on your current medications as directed. Please refer to the Current Medication list given to you today.

## 2011-11-28 ENCOUNTER — Telehealth: Payer: Self-pay | Admitting: *Deleted

## 2011-11-28 NOTE — Telephone Encounter (Signed)
Mailed copy at patient request

## 2011-11-28 NOTE — Telephone Encounter (Signed)
Message copied by Burnell Blanks on Thu Nov 28, 2011 10:08 AM ------      Message from: Cassell Clement      Created: Wed Nov 27, 2011  9:00 PM       Please report to patient.  The recent labs are stable. Continue same medication and careful diet.Send copy to patient.

## 2011-11-29 ENCOUNTER — Ambulatory Visit (INDEPENDENT_AMBULATORY_CARE_PROVIDER_SITE_OTHER): Payer: Medicare Other

## 2011-11-29 DIAGNOSIS — Z4802 Encounter for removal of sutures: Secondary | ICD-10-CM

## 2011-11-29 NOTE — Progress Notes (Signed)
Patient came in for suture removal from her right calf.   Her incision is healed and I removed the sutures.  Patient will return as needed.

## 2011-12-13 DIAGNOSIS — E119 Type 2 diabetes mellitus without complications: Secondary | ICD-10-CM | POA: Diagnosis not present

## 2011-12-13 DIAGNOSIS — R5383 Other fatigue: Secondary | ICD-10-CM | POA: Diagnosis not present

## 2011-12-19 DIAGNOSIS — R5383 Other fatigue: Secondary | ICD-10-CM | POA: Diagnosis not present

## 2011-12-25 DIAGNOSIS — H3530 Unspecified macular degeneration: Secondary | ICD-10-CM | POA: Diagnosis not present

## 2011-12-25 DIAGNOSIS — H25019 Cortical age-related cataract, unspecified eye: Secondary | ICD-10-CM | POA: Diagnosis not present

## 2011-12-25 DIAGNOSIS — E109 Type 1 diabetes mellitus without complications: Secondary | ICD-10-CM | POA: Diagnosis not present

## 2012-03-25 DIAGNOSIS — Z23 Encounter for immunization: Secondary | ICD-10-CM | POA: Diagnosis not present

## 2012-03-27 ENCOUNTER — Ambulatory Visit (INDEPENDENT_AMBULATORY_CARE_PROVIDER_SITE_OTHER): Payer: Medicare Other | Admitting: Cardiology

## 2012-03-27 ENCOUNTER — Encounter: Payer: Self-pay | Admitting: Cardiology

## 2012-03-27 VITALS — BP 130/70 | HR 66 | Ht 68.0 in | Wt 167.0 lb

## 2012-03-27 DIAGNOSIS — E7800 Pure hypercholesterolemia, unspecified: Secondary | ICD-10-CM

## 2012-03-27 DIAGNOSIS — E119 Type 2 diabetes mellitus without complications: Secondary | ICD-10-CM

## 2012-03-27 DIAGNOSIS — I119 Hypertensive heart disease without heart failure: Secondary | ICD-10-CM | POA: Diagnosis not present

## 2012-03-27 LAB — BASIC METABOLIC PANEL
CO2: 27 mEq/L (ref 19–32)
Calcium: 9.8 mg/dL (ref 8.4–10.5)
Chloride: 101 mEq/L (ref 96–112)
Creatinine, Ser: 0.9 mg/dL (ref 0.4–1.2)
Sodium: 138 mEq/L (ref 135–145)

## 2012-03-27 LAB — HEPATIC FUNCTION PANEL
Albumin: 4.4 g/dL (ref 3.5–5.2)
Alkaline Phosphatase: 89 U/L (ref 39–117)
Total Protein: 7.6 g/dL (ref 6.0–8.3)

## 2012-03-27 LAB — LIPID PANEL
Cholesterol: 183 mg/dL (ref 0–200)
HDL: 43 mg/dL (ref >39.00)
LDL Cholesterol: 111 mg/dL — ABNORMAL HIGH (ref 0–99)
Total CHOL/HDL Ratio: 4
Triglycerides: 143 mg/dL (ref 0.0–149.0)
VLDL: 28.6 mg/dL (ref 0.0–40.0)

## 2012-03-27 NOTE — Patient Instructions (Addendum)
Your physician recommends that you continue on your current medications as directed. Please refer to the Current Medication list given to you today.  Your physician wants you to follow-up in: 4 months with fasting labs (lp/bmet/hfp) You will receive a reminder letter in the mail two months in advance. If you don't receive a letter, please call our office to schedule the follow-up appointment.  

## 2012-03-27 NOTE — Assessment & Plan Note (Signed)
Patient is intolerant of statins but is on gemfibrozil 600 mg daily.  Blood work today is pending

## 2012-03-27 NOTE — Progress Notes (Signed)
Quick Note:  Please report to patient. The recent labs are stable. Continue same medication and careful diet. BUN higher. Increase water. Mail patient a copy. ______

## 2012-03-27 NOTE — Progress Notes (Signed)
[name] Date of Birth:  1932-03-02 [facility] [address] [place], [place]  [address] [phone]  Fax   [phone]  HPI: This pleasant 78 year old woman is seen for a scheduled followup office visit. She has been feeling well since last visit. She has a history of high blood pressure, hypercholesterolemia, and diabetes. She's had no new symptoms since last visit.  She walks twice a day for exercise.  Current Outpatient Prescriptions  Medication Sig Dispense Refill  . amLODipine (NORVASC) 10 MG tablet Take 10 mg by mouth daily.        Marland Kitchen aspirin 81 MG tablet Take 81 mg by mouth daily.        Marland Kitchen CINNAMON PO Take 1 capsule by mouth daily.        Marland Kitchen gemfibrozil (LOPID) 600 MG tablet Take 600 mg by mouth 2 (two) times daily before a meal.        . losartan-hydrochlorothiazide (HYZAAR) 100-25 MG per tablet Take 1 tablet by mouth daily.        . metFORMIN (GLUCOPHAGE) 1000 MG tablet Take 1,000 mg by mouth as directed. Take 1,000 mg in AM and 500 mg in PM.      . multivitamin (THERAGRAN) per tablet Take 1 tablet by mouth daily.        . Red Yeast Rice Extract (RED YEAST RICE PO) Take 1 capsule by mouth 2 (two) times daily.          Allergies  Allergen Reactions  . Prednisone     rash    Patient Active Problem List  Diagnosis  . Hypercholesterolemia  . Diabetes mellitus  . Benign hypertensive heart disease without heart failure    History  Smoking status  . Never Smoker   Smokeless tobacco  . Never Used    History  Alcohol Use No    Family History  Problem Relation Age of Onset  . Cancer Mother     ovaian  . Hypertension Father     kidney  . Stroke Father   . Cancer Father   . Diabetes Brother   . Hypertension Sister     Review of Systems: The patient denies any heat or cold intolerance.  No weight gain or weight loss.  The patient denies headaches or blurry vision.  There is no cough or sputum production.  The patient denies  dizziness.  There is no hematuria or hematochezia.  The patient denies any muscle aches or arthritis.  The patient denies any rash.  The patient denies frequent falling or instability.  There is no history of depression or anxiety.  All other systems were reviewed and are negative.   Physical Exam: Filed Vitals:   03/27/12 1012  BP: 130/70  Pulse: 66   the general appearance reveals a well-developed well-nourished woman in no distress.The head and neck exam reveals pupils equal and reactive.  Extraocular movements are full.  There is no scleral icterus.  The mouth and pharynx are normal.  The neck is supple.  The carotids reveal no bruits.  The jugular venous pressure is normal.  The  thyroid is not enlarged.  There is no lymphadenopathy.  The chest is clear to percussion and auscultation.  There are no rales or rhonchi.  Expansion of the chest is symmetrical.  The precordium is quiet.  The first heart sound is normal.  The second heart sound is physiologically split.  There is no murmur gallop rub or click.  There  is no abnormal lift or heave.  The abdomen is soft and nontender.  The bowel sounds are normal.  The liver and spleen are not enlarged.  There are no abdominal masses.  There are no abdominal bruits.  Extremities reveal good pedal pulses.  There is no phlebitis or edema.  There is no cyanosis or clubbing.  Strength is normal and symmetrical in all extremities.  There is no lateralizing weakness.  There are no sensory deficits.  The skin is warm and dry.  There is no rash.      Assessment / Plan: Patient is doing very well.  No change in meds.  Await labs.  Recheck in 4 months

## 2012-03-27 NOTE — Assessment & Plan Note (Signed)
She is not having any hypoglycemic episodes.  She takes 1000 mg metformin in the morning and 500 in the evening

## 2012-03-30 ENCOUNTER — Telehealth: Payer: Self-pay | Admitting: *Deleted

## 2012-03-30 NOTE — Telephone Encounter (Signed)
Advised patient of lab results and mailed copy 

## 2012-03-30 NOTE — Telephone Encounter (Signed)
Message copied by Burnell Blanks on Mon Mar 30, 2012  9:43 AM ------      Message from: Cassell Clement      Created: Fri Mar 27, 2012  2:53 PM       Please report to patient.  The recent labs are stable. Continue same medication and careful diet. BUN higher.  Increase water. Mail patient a copy.

## 2012-04-05 DIAGNOSIS — M79609 Pain in unspecified limb: Secondary | ICD-10-CM | POA: Diagnosis not present

## 2012-04-05 DIAGNOSIS — M25539 Pain in unspecified wrist: Secondary | ICD-10-CM | POA: Diagnosis not present

## 2012-04-09 DIAGNOSIS — E119 Type 2 diabetes mellitus without complications: Secondary | ICD-10-CM | POA: Diagnosis not present

## 2012-04-09 DIAGNOSIS — M25539 Pain in unspecified wrist: Secondary | ICD-10-CM | POA: Diagnosis not present

## 2012-04-20 ENCOUNTER — Other Ambulatory Visit: Payer: Self-pay | Admitting: Family Medicine

## 2012-04-20 ENCOUNTER — Ambulatory Visit (HOSPITAL_COMMUNITY)
Admission: RE | Admit: 2012-04-20 | Discharge: 2012-04-20 | Disposition: A | Discharge status: Home / Self Care | Payer: Medicare Other | Source: Ambulatory Visit | Attending: Family Medicine | Admitting: Family Medicine

## 2012-04-20 DIAGNOSIS — M25539 Pain in unspecified wrist: Secondary | ICD-10-CM

## 2012-04-20 DIAGNOSIS — S59919A Unspecified injury of unspecified forearm, initial encounter: Secondary | ICD-10-CM | POA: Diagnosis not present

## 2012-04-20 DIAGNOSIS — S6990XA Unspecified injury of unspecified wrist, hand and finger(s), initial encounter: Secondary | ICD-10-CM | POA: Diagnosis not present

## 2012-04-21 DIAGNOSIS — M25549 Pain in joints of unspecified hand: Secondary | ICD-10-CM | POA: Diagnosis not present

## 2012-04-21 DIAGNOSIS — L84 Corns and callosities: Secondary | ICD-10-CM | POA: Diagnosis not present

## 2012-05-14 DIAGNOSIS — M1710 Unilateral primary osteoarthritis, unspecified knee: Secondary | ICD-10-CM | POA: Diagnosis not present

## 2012-05-21 DIAGNOSIS — M1710 Unilateral primary osteoarthritis, unspecified knee: Secondary | ICD-10-CM | POA: Diagnosis not present

## 2012-05-27 DIAGNOSIS — M1710 Unilateral primary osteoarthritis, unspecified knee: Secondary | ICD-10-CM | POA: Diagnosis not present

## 2012-07-13 ENCOUNTER — Other Ambulatory Visit (INDEPENDENT_AMBULATORY_CARE_PROVIDER_SITE_OTHER): Payer: Medicare Other

## 2012-07-13 ENCOUNTER — Encounter: Payer: Self-pay | Admitting: Cardiology

## 2012-07-13 ENCOUNTER — Ambulatory Visit (INDEPENDENT_AMBULATORY_CARE_PROVIDER_SITE_OTHER): Payer: Medicare Other | Admitting: Cardiology

## 2012-07-13 ENCOUNTER — Other Ambulatory Visit: Payer: Self-pay | Admitting: *Deleted

## 2012-07-13 VITALS — BP 141/62 | HR 71 | Resp 18 | Ht 67.0 in | Wt 165.1 lb

## 2012-07-13 DIAGNOSIS — I493 Ventricular premature depolarization: Secondary | ICD-10-CM

## 2012-07-13 DIAGNOSIS — I119 Hypertensive heart disease without heart failure: Secondary | ICD-10-CM

## 2012-07-13 DIAGNOSIS — E7800 Pure hypercholesterolemia, unspecified: Secondary | ICD-10-CM

## 2012-07-13 DIAGNOSIS — I4949 Other premature depolarization: Secondary | ICD-10-CM | POA: Diagnosis not present

## 2012-07-13 LAB — BASIC METABOLIC PANEL
Chloride: 101 mEq/L (ref 96–112)
GFR: 57.53 mL/min — ABNORMAL LOW (ref >60.00)
Glucose, Bld: 117 mg/dL — ABNORMAL HIGH (ref 70–99)
Potassium: 3.8 mEq/L (ref 3.5–5.1)
Sodium: 139 mEq/L (ref 135–145)

## 2012-07-13 LAB — HEPATIC FUNCTION PANEL
ALT: 18 U/L (ref 0–35)
Total Protein: 7.4 g/dL (ref 6.0–8.3)

## 2012-07-13 LAB — LIPID PANEL
Cholesterol: 162 mg/dL (ref 0–200)
HDL: 41.7 mg/dL (ref >39.00)
LDL Cholesterol: 99 mg/dL (ref 0–99)
VLDL: 21.8 mg/dL (ref 0.0–40.0)

## 2012-07-13 NOTE — Patient Instructions (Addendum)
Will have you go to [facility] for chest xray soon and call you with the results  Your physician recommends that you continue on your current medications as directed. Please refer to the Current Medication list given to you today.  Your physician recommends that you schedule a follow-up appointment in: 4 months with fasting labs (lp/bmet/hfp) .   Will obtain labs today and call you with the results (lp/bmet/hfp)

## 2012-07-13 NOTE — Assessment & Plan Note (Signed)
The patient has not been experiencing any symptoms of congestive heart failure 

## 2012-07-13 NOTE — Progress Notes (Signed)
Virginia Crews Date of Birth:  April 21, 1932 [facility] [address] [place], [place]  [address] [phone]  Fax   [phone]  HPI: This pleasant 79 year old woman is seen for a scheduled followup office visit. She has been feeling well since last visit. She has a history of high blood pressure, hypercholesterolemia, and diabetes. She's had no new symptoms since last visit. She walks twice a day for exercise.  She notes what she describes as an occasional "weak" feeling in her chest which is very transient.  It does not occur with exercise and she is still walking 2 miles a day.   Current Outpatient Prescriptions  Medication Sig Dispense Refill  . amLODipine (NORVASC) 10 MG tablet Take 10 mg by mouth daily.        Marland Kitchen aspirin 81 MG tablet Take 81 mg by mouth daily.        Marland Kitchen CINNAMON PO Take 1 capsule by mouth daily.        Marland Kitchen gemfibrozil (LOPID) 600 MG tablet Take 600 mg by mouth 2 (two) times daily before a meal.        . losartan-hydrochlorothiazide (HYZAAR) 100-25 MG per tablet Take 1 tablet by mouth daily.        . metFORMIN (GLUCOPHAGE) 1000 MG tablet Take 1,000 mg by mouth as directed. Take 1,000 mg in AM and 500 mg in PM.      . multivitamin (THERAGRAN) per tablet Take 1 tablet by mouth daily.        . Red Yeast Rice Extract (RED YEAST RICE PO) Take 1 capsule by mouth 2 (two) times daily.          Allergies  Allergen Reactions  . Prednisone     rash    Patient Active Problem List  Diagnosis  . Hypercholesterolemia  . Diabetes mellitus  . Benign hypertensive heart disease without heart failure  . PVCs (premature ventricular contractions)    History  Smoking status  . Never Smoker   Smokeless tobacco  . Never Used    History  Alcohol Use No    Family History  Problem Relation Age of Onset  . Cancer Mother     ovaian  . Hypertension Father     kidney  . Stroke Father   . Cancer Father   . Diabetes Brother   . Hypertension Sister      Review of Systems: The patient denies any heat or cold intolerance.  No weight gain or weight loss.  The patient denies headaches or blurry vision.  There is no cough or sputum production.  The patient denies dizziness.  There is no hematuria or hematochezia.  The patient denies any muscle aches or arthritis.  The patient denies any rash.  The patient denies frequent falling or instability.  There is no history of depression or anxiety.  All other systems were reviewed and are negative.   Physical Exam: Filed Vitals:   07/13/12 1048  BP: 141/62  Pulse: 71  Resp: 18   the general appearance reveals a well-developed well-nourished woman in no distress.The head and neck exam reveals pupils equal and reactive.  Extraocular movements are full.  There is no scleral icterus.  The mouth and pharynx are normal.  The neck is supple.  The carotids reveal no bruits.  The jugular venous pressure is normal.  The  thyroid is not enlarged.  There is no lymphadenopathy.  The chest is clear to percussion and auscultation.  There  are no rales or rhonchi.  Expansion of the chest is symmetrical.  The precordium is quiet.  The first heart sound is normal.  The second heart sound is physiologically split.  There is no murmur gallop rub or click.  There is no abnormal lift or heave.  The abdomen is soft and nontender.  The bowel sounds are normal.  The liver and spleen are not enlarged.  There are no abdominal masses.  There are no abdominal bruits.  Extremities reveal good pedal pulses.  There is no phlebitis or edema.  There is no cyanosis or clubbing.  Strength is normal and symmetrical in all extremities.  There is no lateralizing weakness.  There are no sensory deficits.  The skin is warm and dry.  There is no rash.  EKG shows normal sinus rhythm with occasional PVC and no ischemic changes.    Assessment / Plan:  Continue on same medication.  Continue regular exercise.  Recheck in 4 months for office visit  lipid panel hepatic function panel and basal metabolic panel. Await results of chest x-ray to be done in the next several days

## 2012-07-13 NOTE — Progress Notes (Signed)
Quick Note:  Please report to patient. The recent labs are stable. Continue same medication and careful diet. Mail copy to patient. ______

## 2012-07-13 NOTE — Assessment & Plan Note (Signed)
EKG today shows occasional PVCs.  This may be what she is experiencing and describing as a weak feeling. She is overdue for a chest x-ray and we will arrange for her to have one at [facility] in the near future.

## 2012-07-13 NOTE — Assessment & Plan Note (Signed)
The patient has not been experiencing any hypoglycemic episodes. 

## 2012-07-14 ENCOUNTER — Telehealth: Payer: Self-pay | Admitting: *Deleted

## 2012-07-14 ENCOUNTER — Ambulatory Visit (HOSPITAL_COMMUNITY)
Admission: RE | Admit: 2012-07-14 | Discharge: 2012-07-14 | Disposition: A | Discharge status: Home / Self Care | Payer: Medicare Other | Source: Ambulatory Visit | Attending: Cardiology | Admitting: Cardiology

## 2012-07-14 DIAGNOSIS — R0789 Other chest pain: Secondary | ICD-10-CM | POA: Diagnosis not present

## 2012-07-14 DIAGNOSIS — I10 Essential (primary) hypertension: Secondary | ICD-10-CM | POA: Diagnosis not present

## 2012-07-14 DIAGNOSIS — E119 Type 2 diabetes mellitus without complications: Secondary | ICD-10-CM | POA: Diagnosis not present

## 2012-07-14 DIAGNOSIS — R079 Chest pain, unspecified: Secondary | ICD-10-CM | POA: Diagnosis not present

## 2012-07-14 DIAGNOSIS — I119 Hypertensive heart disease without heart failure: Secondary | ICD-10-CM

## 2012-07-14 NOTE — Telephone Encounter (Signed)
Advised and mailed copy

## 2012-07-14 NOTE — Telephone Encounter (Signed)
Message copied by Burnell Blanks on Tue Jul 14, 2012 10:22 AM ------      Message from: Cassell Clement      Created: Mon Jul 13, 2012  9:03 PM       Please report to patient.  The recent labs are stable. Continue same medication and careful diet. Mail copy to patient.

## 2012-07-15 ENCOUNTER — Telehealth: Payer: Self-pay | Admitting: *Deleted

## 2012-07-15 NOTE — Telephone Encounter (Signed)
Advised of xray results

## 2012-07-15 NOTE — Telephone Encounter (Signed)
Message copied by Burnell Blanks on Wed Jul 15, 2012  1:31 PM ------      Message from: Cassell Clement      Created: Wed Jul 15, 2012 11:22 AM       Please report.  The chest x-ray is stable.  Mild cardiomegaly is unchanged and there are no new findings.  Continue current medication

## 2012-07-31 DIAGNOSIS — H3530 Unspecified macular degeneration: Secondary | ICD-10-CM | POA: Diagnosis not present

## 2012-07-31 DIAGNOSIS — E109 Type 1 diabetes mellitus without complications: Secondary | ICD-10-CM | POA: Diagnosis not present

## 2012-07-31 DIAGNOSIS — Z961 Presence of intraocular lens: Secondary | ICD-10-CM | POA: Diagnosis not present

## 2012-08-07 DIAGNOSIS — I8390 Asymptomatic varicose veins of unspecified lower extremity: Secondary | ICD-10-CM | POA: Diagnosis not present

## 2012-08-07 DIAGNOSIS — E119 Type 2 diabetes mellitus without complications: Secondary | ICD-10-CM | POA: Diagnosis not present

## 2012-08-29 ENCOUNTER — Encounter: Payer: Self-pay | Admitting: *Deleted

## 2012-09-24 ENCOUNTER — Other Ambulatory Visit: Payer: Self-pay | Admitting: Family Medicine

## 2012-09-24 DIAGNOSIS — Z139 Encounter for screening, unspecified: Secondary | ICD-10-CM

## 2012-10-21 ENCOUNTER — Other Ambulatory Visit: Payer: Self-pay | Admitting: Nurse Practitioner

## 2012-10-26 ENCOUNTER — Ambulatory Visit (HOSPITAL_COMMUNITY)
Admission: RE | Admit: 2012-10-26 | Discharge: 2012-10-26 | Disposition: A | Discharge status: Home / Self Care | Payer: Medicare Other | Source: Ambulatory Visit | Attending: Family Medicine | Admitting: Family Medicine

## 2012-10-26 DIAGNOSIS — Z1231 Encounter for screening mammogram for malignant neoplasm of breast: Secondary | ICD-10-CM | POA: Diagnosis not present

## 2012-10-26 DIAGNOSIS — Z139 Encounter for screening, unspecified: Secondary | ICD-10-CM

## 2012-10-29 ENCOUNTER — Other Ambulatory Visit: Payer: PRIVATE HEALTH INSURANCE

## 2012-11-12 ENCOUNTER — Other Ambulatory Visit: Payer: PRIVATE HEALTH INSURANCE

## 2012-11-12 ENCOUNTER — Ambulatory Visit: Payer: PRIVATE HEALTH INSURANCE | Admitting: Cardiology

## 2012-12-01 ENCOUNTER — Other Ambulatory Visit (INDEPENDENT_AMBULATORY_CARE_PROVIDER_SITE_OTHER): Payer: Medicare Other

## 2012-12-01 ENCOUNTER — Encounter: Payer: Self-pay | Admitting: Cardiology

## 2012-12-01 ENCOUNTER — Ambulatory Visit (INDEPENDENT_AMBULATORY_CARE_PROVIDER_SITE_OTHER): Payer: Medicare Other | Admitting: Cardiology

## 2012-12-01 VITALS — BP 148/72 | HR 64 | Ht 68.0 in | Wt 172.8 lb

## 2012-12-01 DIAGNOSIS — I4949 Other premature depolarization: Secondary | ICD-10-CM | POA: Diagnosis not present

## 2012-12-01 DIAGNOSIS — E7800 Pure hypercholesterolemia, unspecified: Secondary | ICD-10-CM

## 2012-12-01 DIAGNOSIS — I493 Ventricular premature depolarization: Secondary | ICD-10-CM

## 2012-12-01 DIAGNOSIS — I119 Hypertensive heart disease without heart failure: Secondary | ICD-10-CM

## 2012-12-01 LAB — HEPATIC FUNCTION PANEL
ALT: 17 U/L (ref 0–35)
AST: 21 U/L (ref 0–37)
Albumin: 4.3 g/dL (ref 3.5–5.2)
Alkaline Phosphatase: 79 U/L (ref 39–117)
Total Protein: 7.5 g/dL (ref 6.0–8.3)

## 2012-12-01 LAB — LIPID PANEL
Cholesterol: 169 mg/dL (ref 0–200)
HDL: 39.9 mg/dL (ref >39.00)
Triglycerides: 142 mg/dL (ref 0.0–149.0)

## 2012-12-01 LAB — BASIC METABOLIC PANEL
BUN: 34 mg/dL — ABNORMAL HIGH (ref 6–23)
Calcium: 9.7 mg/dL (ref 8.4–10.5)
Creatinine, Ser: 1 mg/dL (ref 0.4–1.2)
GFR: 56.81 mL/min — ABNORMAL LOW (ref >60.00)
Potassium: 4.2 mEq/L (ref 3.5–5.1)

## 2012-12-01 NOTE — Assessment & Plan Note (Addendum)
Patient has a history of hypercholesterolemia.  She is intolerant of statins.  She is on gemfibrozil and red yeast rice.  We're checking lab work today.

## 2012-12-01 NOTE — Assessment & Plan Note (Signed)
The patient has not been expressing any palpitations.  No chest pain with exertion.

## 2012-12-01 NOTE — Progress Notes (Signed)
Quick Note:  Please report to patient. The recent labs are stable. Continue same medication and careful diet. ______ 

## 2012-12-01 NOTE — Patient Instructions (Addendum)
Will obtain labs today and call you with the results (lp/bmet/hfp)  Your physician recommends that you continue on your current medications as directed. Please refer to the Current Medication list given to you today.  Your physician recommends that you schedule a follow-up appointment in: 4 months with fasting labs (lp/bmet/hfp)  

## 2012-12-01 NOTE — Assessment & Plan Note (Signed)
Blood pressure at home has been stable.  Blood pressure this morning here in the office is slightly elevated which she attributes to heavy traffic driving here from out of town.  Continue same medication.  Last evening she did consume some fried food which was higher in salt than she is used to

## 2012-12-01 NOTE — Progress Notes (Signed)
Virginia Crews Date of Birth:  08/05/1931 [facility] [address] [place], [place]  [address] [phone]  Fax   [phone]  HPI: This pleasant 79 year old woman is seen for a scheduled followup office visit. She has been feeling well since last visit. She has a history of high blood pressure, hypercholesterolemia, and diabetes. She's had no new symptoms since last visit. She walks twice a day for exercise.  Since last visit she has gained 7 pounds.  She does not think that she is eating any more than usual.  She is avoiding carbohydrates.  She walks 2 miles a day without difficulty.  Current Outpatient Prescriptions  Medication Sig Dispense Refill  . ALPRAZolam (XANAX) 0.25 MG tablet Take 0.25 mg by mouth. One every 6 hours prn      . amLODipine (NORVASC) 10 MG tablet Take 10 mg by mouth daily.        Marland Kitchen aspirin 81 MG tablet Take 81 mg by mouth daily.        . beta carotene w/minerals (OCUVITE) tablet Take 1 tablet by mouth daily.      Marland Kitchen CINNAMON PO Take 1 capsule by mouth daily.       Marland Kitchen gemfibrozil (LOPID) 600 MG tablet Take 600 mg by mouth 2 (two) times daily before a meal.        . losartan-hydrochlorothiazide (HYZAAR) 100-25 MG per tablet Take 1 tablet by mouth daily.        . metFORMIN (GLUCOPHAGE) 1000 MG tablet       . multivitamin (THERAGRAN) per tablet Take 1 tablet by mouth daily.        . Red Yeast Rice Extract (RED YEAST RICE PO) Take 1 capsule by mouth 2 (two) times daily.        . traZODone (DESYREL) 50 MG tablet Take 50 mg by mouth at bedtime.        No current facility-administered medications for this visit.    Allergies  Allergen Reactions  . Phenergan (Promethazine Hcl)     hallucinations  . Prednisone     rash  . Statins     Severe myalgias    Patient Active Problem List   Diagnosis Date Noted  . PVCs (premature ventricular contractions) 07/13/2012  . Hypercholesterolemia 02/15/2011  . Diabetes mellitus 02/15/2011  . Benign  hypertensive heart disease without heart failure 02/15/2011    History  Smoking status  . Never Smoker   Smokeless tobacco  . Never Used    History  Alcohol Use No    Family History  Problem Relation Age of Onset  . Cancer Mother     ovaian  . Hypertension Father     kidney  . Stroke Father   . Cancer Father   . Diabetes Brother   . Hypertension Sister     Review of Systems: The patient denies any heat or cold intolerance.  No weight gain or weight loss.  The patient denies headaches or blurry vision.  There is no cough or sputum production.  The patient denies dizziness.  There is no hematuria or hematochezia.  The patient denies any muscle aches or arthritis.  The patient denies any rash.  The patient denies frequent falling or instability.  There is no history of depression or anxiety.  All other systems were reviewed and are negative.   Physical Exam: Filed Vitals:   12/01/12 0837  BP: 148/72  Pulse: 64   the general appearance reveals a well-developed  well-nourished alert elderly woman in no distress.The head and neck exam reveals pupils equal and reactive.  Extraocular movements are full.  There is no scleral icterus.  The mouth and pharynx are normal.  The neck is supple.  The carotids reveal no bruits.  The jugular venous pressure is normal.  The  thyroid is not enlarged.  There is no lymphadenopathy.  The chest is clear to percussion and auscultation.  There are no rales or rhonchi.  Expansion of the chest is symmetrical.  The precordium is quiet.  The first heart sound is normal.  The second heart sound is physiologically split.  There is no murmur gallop rub or click.  There is no abnormal lift or heave.  The abdomen is soft and nontender.  The bowel sounds are normal.  The liver and spleen are not enlarged.  There are no abdominal masses.  There are no abdominal bruits.  Extremities reveal good pedal pulses.  There is no phlebitis or edema.  There is no cyanosis or  clubbing.  Strength is normal and symmetrical in all extremities.  There is no lateralizing weakness.  There are no sensory deficits.  The skin is warm and dry.  There is no rash.      Assessment / Plan: Continue same medication.  Recheck in 4 months for followup office visit lipid panel hepatic function panel and basal metabolic panel.

## 2012-12-03 ENCOUNTER — Other Ambulatory Visit: Payer: Self-pay | Admitting: Family Medicine

## 2012-12-15 ENCOUNTER — Ambulatory Visit (INDEPENDENT_AMBULATORY_CARE_PROVIDER_SITE_OTHER): Payer: Medicare Other | Admitting: Family Medicine

## 2012-12-15 ENCOUNTER — Encounter: Payer: Self-pay | Admitting: Family Medicine

## 2012-12-15 VITALS — BP 114/70 | HR 80 | Wt 174.0 lb

## 2012-12-15 DIAGNOSIS — E119 Type 2 diabetes mellitus without complications: Secondary | ICD-10-CM | POA: Diagnosis not present

## 2012-12-15 LAB — POCT GLYCOSYLATED HEMOGLOBIN (HGB A1C): Hemoglobin A1C: 6.6

## 2012-12-15 NOTE — Patient Instructions (Signed)

## 2012-12-15 NOTE — Progress Notes (Signed)
  Subjective:    Patient ID: [name], female    DOB: 1932/02/02, 79 y.o.   MRN: [id number]  Diabetes She has type 2 diabetes mellitus. She is following a generally healthy diet. She participates in exercise daily. Her breakfast blood glucose is taken between 8-9 am. Her breakfast blood glucose range is generally 140-180 mg/dl. She does not see a podiatrist.Eye exam is current.   patient does admit to some late night snacking sometimes of starchy items. She does try to exercise does try to watch her diet. Past medical history heart disease and diabetes Family history noncontributory Social does not smoke Denies chest tightness pressure pain shortness breath or swelling in the legs.    Review of Systems     Objective:   Physical Exam Blood pressure noted foot exam bunions lungs are clear hearts regular pulse normal skin warm dry neurologic grossly       Assessment & Plan:  Diabetes-A1c slightly elevated she'll watch closely with the diet she will exercise more often and she will recheck A1c again in 3-6 months. Followup at that time.

## 2012-12-30 DIAGNOSIS — H3530 Unspecified macular degeneration: Secondary | ICD-10-CM | POA: Diagnosis not present

## 2012-12-30 DIAGNOSIS — E109 Type 1 diabetes mellitus without complications: Secondary | ICD-10-CM | POA: Diagnosis not present

## 2012-12-30 DIAGNOSIS — H25019 Cortical age-related cataract, unspecified eye: Secondary | ICD-10-CM | POA: Diagnosis not present

## 2013-01-18 ENCOUNTER — Other Ambulatory Visit: Payer: Self-pay | Admitting: *Deleted

## 2013-01-18 MED ORDER — METFORMIN HCL 1000 MG PO TABS: 1000.0000 mg | ORAL_TABLET | Freq: Two times a day (BID) | ORAL | Status: DC

## 2013-02-19 ENCOUNTER — Ambulatory Visit (INDEPENDENT_AMBULATORY_CARE_PROVIDER_SITE_OTHER): Payer: Medicare Other | Admitting: *Deleted

## 2013-02-19 DIAGNOSIS — H6120 Impacted cerumen, unspecified ear: Secondary | ICD-10-CM | POA: Diagnosis not present

## 2013-02-19 NOTE — Progress Notes (Signed)
Bilateral ear irrigation without difficulty.

## 2013-03-10 DIAGNOSIS — M1710 Unilateral primary osteoarthritis, unspecified knee: Secondary | ICD-10-CM | POA: Diagnosis not present

## 2013-03-12 DIAGNOSIS — Z23 Encounter for immunization: Secondary | ICD-10-CM | POA: Diagnosis not present

## 2013-03-16 ENCOUNTER — Ambulatory Visit (INDEPENDENT_AMBULATORY_CARE_PROVIDER_SITE_OTHER): Payer: Medicare Other | Admitting: Family Medicine

## 2013-03-16 ENCOUNTER — Encounter: Payer: Self-pay | Admitting: Family Medicine

## 2013-03-16 VITALS — BP 142/72 | HR 100 | Ht 68.0 in | Wt 172.8 lb

## 2013-03-16 DIAGNOSIS — I119 Hypertensive heart disease without heart failure: Secondary | ICD-10-CM | POA: Diagnosis not present

## 2013-03-16 DIAGNOSIS — R0609 Other forms of dyspnea: Secondary | ICD-10-CM | POA: Diagnosis not present

## 2013-03-16 DIAGNOSIS — R0600 Dyspnea, unspecified: Secondary | ICD-10-CM

## 2013-03-16 NOTE — Progress Notes (Signed)
  Subjective:    Patient ID: [name], female    DOB: 16-Apr-1932, 79 y.o.   MRN: [id number]  HPI Here today b/c patient states she hasn't been feeling like herself. She had some dizzy spells and high blood pressures. This morning it was 187/87 before taking her meds. She has felt like this since Sunday.   Feels off balance No numbness Doesn't feel right Heart races at times throughout the day PMH HTN diabetes PVCs heart disease Does not smoke not around smoke  Review of Systems Denies vomiting diarrhea relates feeling off-balance at times denies any falls    Objective:   Physical Exam Blood pressure initially elevated but after some period of time his rechecked again and actually looks better lungs are clear hearts regular pulse normal extremities no edema       Assessment & Plan:  ekg-was ordered no acute findings Hypertension-stable on recheck patient will check periodically if elevated she will let us know. She will see her cardiologist in 2 weeks and followup with [place] in approximately one to 2 weeks after that. No sign of any stroke. Warning signs of this was discussed. If heart races or severe elevation of blood pressure let us know right away.

## 2013-03-17 DIAGNOSIS — M1710 Unilateral primary osteoarthritis, unspecified knee: Secondary | ICD-10-CM | POA: Diagnosis not present

## 2013-03-24 DIAGNOSIS — M1710 Unilateral primary osteoarthritis, unspecified knee: Secondary | ICD-10-CM | POA: Diagnosis not present

## 2013-03-31 ENCOUNTER — Ambulatory Visit (INDEPENDENT_AMBULATORY_CARE_PROVIDER_SITE_OTHER): Payer: Medicare Other | Admitting: Cardiology

## 2013-03-31 ENCOUNTER — Encounter: Payer: Self-pay | Admitting: Cardiology

## 2013-03-31 VITALS — BP 160/70 | HR 88 | Ht 68.0 in | Wt 170.0 lb

## 2013-03-31 DIAGNOSIS — I119 Hypertensive heart disease without heart failure: Secondary | ICD-10-CM | POA: Diagnosis not present

## 2013-03-31 DIAGNOSIS — E119 Type 2 diabetes mellitus without complications: Secondary | ICD-10-CM

## 2013-03-31 DIAGNOSIS — I4949 Other premature depolarization: Secondary | ICD-10-CM

## 2013-03-31 DIAGNOSIS — I493 Ventricular premature depolarization: Secondary | ICD-10-CM

## 2013-03-31 DIAGNOSIS — E7800 Pure hypercholesterolemia, unspecified: Secondary | ICD-10-CM | POA: Diagnosis not present

## 2013-03-31 LAB — HEPATIC FUNCTION PANEL
ALT: 14 U/L (ref 0–35)
AST: 21 U/L (ref 0–37)
Alkaline Phosphatase: 113 U/L (ref 39–117)
Bilirubin, Direct: 0 mg/dL (ref 0.0–0.3)
Total Bilirubin: 0.7 mg/dL (ref 0.3–1.2)

## 2013-03-31 LAB — LIPID PANEL
HDL: 38.9 mg/dL — ABNORMAL LOW (ref >39.00)
Total CHOL/HDL Ratio: 4

## 2013-03-31 LAB — BASIC METABOLIC PANEL
CO2: 27 mEq/L (ref 19–32)
Calcium: 9.9 mg/dL (ref 8.4–10.5)
Chloride: 102 mEq/L (ref 96–112)
GFR: 58.79 mL/min — ABNORMAL LOW (ref >60.00)
Sodium: 138 mEq/L (ref 135–145)

## 2013-03-31 NOTE — Assessment & Plan Note (Signed)
The patient has a past history of PVCs.  Recently she has not been aware of any PVCs or palpitations.  He did have an episode of hypertension and fast heart rate about 3 weeks ago after she took her flu shot.  She saw her PCP Dr. Cathlyn Parsons who checked her out thoroughly and could find no cause for her symptoms.  Her blood pressure gradually came back to normal levels over the next several days and she has felt fine since that time.  She has been walking 2 miles a day for exercise.

## 2013-03-31 NOTE — Progress Notes (Signed)
Virginia Crews Date of Birth:  04-Jul-1931 [facility] [address] [place], [place]  [address] [phone]  Fax   [phone]  HPI: This pleasant 79 year old woman is seen for a scheduled followup office visit. She has been feeling well since last visit. She has a history of high blood pressure, hypercholesterolemia, and diabetes. She's had no new symptoms since last visit. She walks twice a day for exercise.  Since last visit she has lost 2 pounds.    Current Outpatient Prescriptions  Medication Sig Dispense Refill  . ALPRAZolam (XANAX) 0.25 MG tablet Take 0.25 mg by mouth. One every 6 hours prn      . amLODipine (NORVASC) 10 MG tablet Take 10 mg by mouth daily.        Marland Kitchen aspirin 81 MG tablet Take 81 mg by mouth daily.        . beta carotene w/minerals (OCUVITE) tablet Take 1 tablet by mouth daily.      Marland Kitchen CINNAMON PO Take 1 capsule by mouth daily.       Marland Kitchen gemfibrozil (LOPID) 600 MG tablet Take 600 mg by mouth 2 (two) times daily before a meal.        . losartan-hydrochlorothiazide (HYZAAR) 100-25 MG per tablet Take 1 tablet by mouth daily.        . metFORMIN (GLUCOPHAGE) 1000 MG tablet Take 1 tablet (1,000 mg total) by mouth 2 (two) times daily with a meal.  60 tablet  2  . multivitamin (THERAGRAN) per tablet Take 1 tablet by mouth daily.        . Red Yeast Rice Extract (RED YEAST RICE PO) Take 1 capsule by mouth 2 (two) times daily.        . traZODone (DESYREL) 50 MG tablet Take 50 mg by mouth at bedtime.        No current facility-administered medications for this visit.    Allergies  Allergen Reactions  . Phenergan [Promethazine Hcl]     hallucinations  . Prednisone     rash  . Statins     Severe myalgias    Patient Active Problem List   Diagnosis Date Noted  . PVCs (premature ventricular contractions) 07/13/2012  . Hypercholesterolemia 02/15/2011  . Diabetes mellitus 02/15/2011  . Benign hypertensive heart disease without heart failure  02/15/2011    History  Smoking status  . Never Smoker   Smokeless tobacco  . Never Used    History  Alcohol Use No    Family History  Problem Relation Age of Onset  . Cancer Mother     ovaian  . Hypertension Father     kidney  . Stroke Father   . Cancer Father   . Diabetes Brother   . Hypertension Sister     Review of Systems: The patient denies any heat or cold intolerance.  No weight gain or weight loss.  The patient denies headaches or blurry vision.  There is no cough or sputum production.  The patient denies dizziness.  There is no hematuria or hematochezia.  The patient denies any muscle aches or arthritis.  The patient denies any rash.  The patient denies frequent falling or instability.  There is no history of depression or anxiety.  All other systems were reviewed and are negative.   Physical Exam: Filed Vitals:   03/31/13 0839  BP: 160/70  Pulse: 88   the general appearance reveals a well-developed well-nourished alert elderly woman in no distress.The head and neck  exam reveals pupils equal and reactive.  Extraocular movements are full.  There is no scleral icterus.  The mouth and pharynx are normal.  The neck is supple.  The carotids reveal no bruits.  The jugular venous pressure is normal.  The  thyroid is not enlarged.  There is no lymphadenopathy.  The chest is clear to percussion and auscultation.  There are no rales or rhonchi.  Expansion of the chest is symmetrical.  The precordium is quiet.  The first heart sound is normal.  The second heart sound is physiologically split.  There is no murmur gallop rub or click.  There is no abnormal lift or heave.  The abdomen is soft and nontender.  The bowel sounds are normal.  The liver and spleen are not enlarged.  There are no abdominal masses.  There are no abdominal bruits.  Extremities reveal good pedal pulses.  There is no phlebitis or edema.  There is no cyanosis or clubbing.  Strength is normal and symmetrical in all  extremities.  There is no lateralizing weakness.  There are no sensory deficits.  The skin is warm and dry.  There is no rash.      Assessment / Plan: Continue same medication.  The drive to [facility] has become more difficult for her as she approaches 79 years of age.  She would prefer to transfer her cardiology care to the [facility] office.  This makes sense and we will ask her to return there in about 4 months to see Dr.Koneswaren or Dr. Wyline Mood.  Return here when necessary.  Blood work today was drawn and is pending.

## 2013-03-31 NOTE — Assessment & Plan Note (Signed)
The patient has a history of essential hypertension.  She has not been having any symptoms of congestive heart failure.  She denies any headaches or dizziness.  She monitors her blood pressure at home and it remains in the normal range.  She does acknowledge whitecoat syndrome.  She formerly worked in a [employer] trade [employer] here in [place].

## 2013-03-31 NOTE — Assessment & Plan Note (Signed)
The patient has a history of hypercholesterolemia.  She is intolerant of statins and is on gemfibrozil and red yeast rice.  Blood work today is pending.  She is not having any myalgias

## 2013-03-31 NOTE — Patient Instructions (Addendum)
Will obtain labs today and call you with the results (lp/bmet/hfp)  Your physician recommends that you continue on your current medications as directed. Please refer to the Current Medication list given to you today.  Your physician wants you to follow-up in: 4 months with fasting labs (lp/bmet/hfp) You will receive a reminder letter in the mail two months in advance. If you don't receive a letter, please call our office to schedule the follow-up appointment.  

## 2013-03-31 NOTE — Progress Notes (Signed)
Quick Note:  Please report to patient. The recent labs are stable. Continue same medication and careful diet. BS 142. ______

## 2013-03-31 NOTE — Assessment & Plan Note (Signed)
Her diabetes mellitus continues to be followed closely by her PCP Dr.Lukings.  She denies any hypoglycemic episodes

## 2013-04-01 ENCOUNTER — Telehealth: Payer: Self-pay | Admitting: *Deleted

## 2013-04-01 ENCOUNTER — Other Ambulatory Visit: Payer: Self-pay | Admitting: *Deleted

## 2013-04-01 ENCOUNTER — Other Ambulatory Visit: Payer: Self-pay | Admitting: Family Medicine

## 2013-04-01 ENCOUNTER — Telehealth: Payer: Self-pay | Admitting: Family Medicine

## 2013-04-01 MED ORDER — ALPRAZOLAM 0.25 MG PO TABS: 0.2500 mg | ORAL_TABLET | Freq: Four times a day (QID) | ORAL | Status: DC | PRN

## 2013-04-01 NOTE — Telephone Encounter (Signed)
[name] would like for Dr. Lorin Picket to review the Glucose Reading that she dropped off.  These are attached to the front of the chart and placed in Dr. Roby Lofts in-basket.  Call patient if needed.

## 2013-04-01 NOTE — Telephone Encounter (Signed)
Advised patient of lab results and mailed copy 

## 2013-04-01 NOTE — Telephone Encounter (Signed)
Okay x4 

## 2013-04-01 NOTE — Telephone Encounter (Signed)
Message copied by Burnell Blanks on Thu Apr 01, 2013  9:09 AM ------      Message from: Cassell Clement      Created: Wed Mar 31, 2013  9:03 PM       Please report to patient.  The recent labs are stable. Continue same medication and careful diet.  BS 142. ------

## 2013-04-06 ENCOUNTER — Telehealth: Payer: Self-pay | Admitting: Cardiology

## 2013-04-06 NOTE — Telephone Encounter (Signed)
Mailed to patient

## 2013-04-06 NOTE — Telephone Encounter (Signed)
Please send patient card telling her I reviewed over her the results and was pleased. We will see her at her next followup.

## 2013-04-06 NOTE — Telephone Encounter (Signed)
New Problem  Pt states she has not received her blood work statement in the mail.Marland Kitchen Please send it and call her back to confirm.

## 2013-04-07 NOTE — Telephone Encounter (Signed)
Card mailed 

## 2013-04-12 ENCOUNTER — Other Ambulatory Visit: Payer: Self-pay | Admitting: Family Medicine

## 2013-04-16 ENCOUNTER — Other Ambulatory Visit: Payer: Self-pay | Admitting: Family Medicine

## 2013-04-21 ENCOUNTER — Encounter: Payer: Self-pay | Admitting: Family Medicine

## 2013-04-21 ENCOUNTER — Ambulatory Visit (INDEPENDENT_AMBULATORY_CARE_PROVIDER_SITE_OTHER): Payer: Medicare Other | Admitting: Family Medicine

## 2013-04-21 VITALS — BP 120/64 | Ht 69.0 in | Wt 171.4 lb

## 2013-04-21 DIAGNOSIS — E119 Type 2 diabetes mellitus without complications: Secondary | ICD-10-CM

## 2013-04-21 LAB — POCT GLYCOSYLATED HEMOGLOBIN (HGB A1C): Hemoglobin A1C: 6.6

## 2013-04-21 NOTE — Progress Notes (Signed)
  Subjective:    Patient ID: [name], female    DOB: Mar 22, 1932, 79 y.o.   MRN: [id number]  HPI Patient is here today for diabetic check up. Her blood sugar runs in the 130's (which is normal to her). Patient doing well with her diabetes doing well the medication watching diet well. Exercise activity doing good. No other particular problems. Under under some stress because her brother was diagnosed with advanced melanoma. No concerns.  PMH diabetes heart disease. She will be following up with cardiology locally.  Review of Systems See above. Denies chest tightness pressure pain shortness breath blurred vision excessive thirst or urination    Objective:   Physical Exam Her lungs clear hearts regular pulse normal abdomen soft extremities no edema skin warm dry foot exam done       Assessment & Plan:  Diabetes good control continue current measures followup again in 3 months time. Followup sooner if any other issues.

## 2013-04-23 ENCOUNTER — Encounter: Payer: Self-pay | Admitting: Family Medicine

## 2013-05-18 ENCOUNTER — Telehealth: Payer: Self-pay | Admitting: *Deleted

## 2013-05-18 MED ORDER — CIPROFLOXACIN HCL 500 MG PO TABS: 500.0000 mg | ORAL_TABLET | Freq: Two times a day (BID) | ORAL | Status: AC

## 2013-05-18 NOTE — Telephone Encounter (Signed)
Medication sent in to pharmacy in Epic. Patient was notified.

## 2013-05-18 NOTE — Telephone Encounter (Signed)
Pt seen a few months ago for UTI. Today started having burning with urination and low back pain. No fever.  Cannot come in today because she cannot drive at night.  She is requesting Cipro because that has helped in the past. [facility].

## 2013-05-18 NOTE — Telephone Encounter (Signed)
Cipro 500 one bid for 5 days, f/u if ongoing

## 2013-07-26 ENCOUNTER — Telehealth: Payer: Self-pay

## 2013-07-26 ENCOUNTER — Other Ambulatory Visit: Payer: Self-pay

## 2013-07-26 DIAGNOSIS — I10 Essential (primary) hypertension: Secondary | ICD-10-CM

## 2013-07-26 NOTE — Telephone Encounter (Signed)
Patient states she was seeing Dr. Mare Ferrari in [place].  Dr. Mare Ferrari had her do lab work at the time of her visit.  Patient would like to do lab work the date of her visit and come NPO so she does not have to make a trip later in the week.  Patient knows LFT is usually done and a few other tests.  Please call patient to discuss labwork.  Patient aware call back later today, ok to leave a message, as she said she may go out.

## 2013-07-26 NOTE — Telephone Encounter (Signed)
Orders placed pt to do this week

## 2013-07-27 DIAGNOSIS — I10 Essential (primary) hypertension: Secondary | ICD-10-CM | POA: Diagnosis not present

## 2013-07-27 LAB — HEPATIC FUNCTION PANEL
ALT: 18 U/L (ref 0–35)
AST: 20 U/L (ref 0–37)
Albumin: 4.8 g/dL (ref 3.5–5.2)
Alkaline Phosphatase: 91 U/L (ref 39–117)
BILIRUBIN DIRECT: 0.1 mg/dL (ref 0.0–0.3)
BILIRUBIN INDIRECT: 0.7 mg/dL (ref 0.0–0.9)
BILIRUBIN TOTAL: 0.8 mg/dL (ref 0.3–1.2)
Total Protein: 7.2 g/dL (ref 6.0–8.3)

## 2013-07-27 LAB — BASIC METABOLIC PANEL
BUN: 25 mg/dL — ABNORMAL HIGH (ref 6–23)
CO2: 28 meq/L (ref 19–32)
Calcium: 10.2 mg/dL (ref 8.4–10.5)
Chloride: 99 mEq/L (ref 96–112)
Creat: 0.96 mg/dL (ref 0.50–1.10)
GLUCOSE: 161 mg/dL — AB (ref 70–99)
POTASSIUM: 4.3 meq/L (ref 3.5–5.3)
Sodium: 139 mEq/L (ref 135–145)

## 2013-07-27 LAB — LIPID PANEL
CHOL/HDL RATIO: 4.2 ratio
Cholesterol: 192 mg/dL (ref 0–200)
HDL: 46 mg/dL (ref >39)
LDL Cholesterol: 110 mg/dL — ABNORMAL HIGH (ref 0–99)
Triglycerides: 182 mg/dL — ABNORMAL HIGH (ref <150)
VLDL: 36 mg/dL (ref 0–40)

## 2013-07-28 ENCOUNTER — Encounter: Payer: Self-pay | Admitting: *Deleted

## 2013-07-29 ENCOUNTER — Encounter: Payer: Self-pay | Admitting: Cardiovascular Disease

## 2013-07-29 ENCOUNTER — Ambulatory Visit (INDEPENDENT_AMBULATORY_CARE_PROVIDER_SITE_OTHER): Payer: Medicare Other | Admitting: Cardiovascular Disease

## 2013-07-29 ENCOUNTER — Encounter (INDEPENDENT_AMBULATORY_CARE_PROVIDER_SITE_OTHER): Payer: Self-pay

## 2013-07-29 VITALS — BP 155/48 | HR 74 | Ht 67.0 in | Wt 167.0 lb

## 2013-07-29 DIAGNOSIS — I10 Essential (primary) hypertension: Secondary | ICD-10-CM | POA: Diagnosis not present

## 2013-07-29 DIAGNOSIS — I493 Ventricular premature depolarization: Secondary | ICD-10-CM

## 2013-07-29 DIAGNOSIS — I4949 Other premature depolarization: Secondary | ICD-10-CM | POA: Diagnosis not present

## 2013-07-29 DIAGNOSIS — E119 Type 2 diabetes mellitus without complications: Secondary | ICD-10-CM

## 2013-07-29 DIAGNOSIS — I119 Hypertensive heart disease without heart failure: Secondary | ICD-10-CM

## 2013-07-29 DIAGNOSIS — E7800 Pure hypercholesterolemia, unspecified: Secondary | ICD-10-CM

## 2013-07-29 NOTE — Patient Instructions (Signed)
Your physician wants you to follow-up in: 6 months  You will receive a reminder letter in the mail two months in advance. If you don't receive a letter, please call our office to schedule the follow-up appointment.  Your physician recommends that you continue on your current medications as directed. Please refer to the Current Medication list given to you today.  

## 2013-07-29 NOTE — Progress Notes (Signed)
Patient ID: [name], female   DOB: Jul 01, 1932, 80 y.o.   MRN: [id number]      SUBJECTIVE: The patient is here for routine cardiovascular followup. This is my first time meeting her. She is a 80 year old woman with a history of hypertension and benign hypertensive heart disease, PVCs with palpitations, hyperlipidemia with statin intolerance, and diabetes mellitus.  She usually walks regularly but has not been able to do so with the cold weather. Because of this, her blood sugars have been elevated as well as her blood pressures. She monitors both of these at home regularly. She very seldom has palpitations. She denies chest pain, shortness of breath, lightheadedness, orthopnea, PND and leg swelling.  I reviewed her most recent blood work from January 27 which showed BUN 25, creatinine 0.96, blood glucose 161, triglycerides 182, HDL 46, LDL 110, total cholesterol 192.  Soc: husband died of glioblastoma multiforme at age 11. 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 She now stays active by being involved with her church.  Allergies  Allergen Reactions  . Phenergan [Promethazine Hcl]     hallucinations  . Prednisone     rash  . Statins     Severe myalgias    Current Outpatient Prescriptions  Medication Sig Dispense Refill  . ALPRAZolam (XANAX) 0.25 MG tablet Take 1 tablet (0.25 mg total) by mouth 4 (four) times daily as needed for sleep. One every 6 hours prn  30 tablet  4  . amLODipine (NORVASC) 10 MG tablet TAKE ONE TABLET BY MOUTH EVERY DAY  90 tablet  1  . aspirin 81 MG tablet Take 81 mg by mouth daily.        . beta carotene w/minerals (OCUVITE) tablet Take 1 tablet by mouth daily.      Marland Kitchen CINNAMON PO Take 1 capsule by mouth daily.       Marland Kitchen gemfibrozil (LOPID) 600 MG tablet TAKE ONE TABLET BY MOUTH TWICE DAILY  180 tablet  1  . losartan-hydrochlorothiazide (HYZAAR) 100-25 MG per  tablet TAKE ONE TABLET BY MOUTH EVERY DAY  90 tablet  1  . metFORMIN (GLUCOPHAGE) 1000 MG tablet TAKE ONE TABLET BY MOUTH TWICE DAILY WITH  MEALS  60 tablet  5  . multivitamin (THERAGRAN) per tablet Take 1 tablet by mouth daily.        . Red Yeast Rice Extract (RED YEAST RICE PO) Take 1 capsule by mouth 2 (two) times daily.        . traZODone (DESYREL) 50 MG tablet Take 50 mg by mouth at bedtime.        No current facility-administered medications for this visit.    Past Medical History  Diagnosis Date  . Hypertension   . Diabetes mellitus   . Hyperlipidemia   . Osteoarthritis   . History of hysterectomy   . Diverticulitis     multilple surgeries  . Hypertension 02/13/2011  . Gallstones     Past Surgical History  Procedure Laterality Date  . Appendectomy    . Breast reduction surgery    . Knee surgery    . Refractive surgery    . Cardiac catheterization  06/01/2003    EF is between 60-65% -- Smooth and normal coronary arteries -- Normal left ventricular systolic function  -- We will continue with medical therapy for her hypertensio  . Laparoscopic incisional / umbilical /  ventral hernia repair  02/23/2007   . Colon surgery      left  . Abdominal hysterectomy    . Eye surgery  03/30/2009    cataract    History   Social History  . Marital Status: Widowed    Spouse Name: N/A    Number of Children: N/A  . Years of Education: N/A   Occupational History  . Not on file.   Social History Main Topics  . Smoking status: Never Smoker   . Smokeless tobacco: Never Used  . Alcohol Use: No  . Drug Use: No  . Sexual Activity: Not on file   Other Topics Concern  . Not on file   Social History Narrative  . No narrative on file     Filed Vitals:   07/29/13 0937  BP: 155/48  Pulse: 74  Height: 5\' 7"  (1.702 m)  Weight: 167 lb (75.751 kg)    PHYSICAL EXAM General: NAD Neck: No JVD, no thyromegaly or thyroid nodule.  Lungs: Clear to auscultation bilaterally with  normal respiratory effort. CV: Nondisplaced PMI.  Heart regular S1/S2, no S3/S4, no murmur.  No peripheral edema.  No carotid bruit.  Normal pedal pulses.  Abdomen: Soft, nontender, no hepatosplenomegaly, no distention.  Neurologic: Alert and oriented x 3.  Psych: Normal affect. Extremities: No clubbing or cyanosis.   ECG: reviewed and available in electronic records.      ASSESSMENT AND PLAN: 1. Palpitations/PVC: very seldom experiences them. No medication adjustments needed. 2. HTN: They were previously well controlled at home and she was able to walk regularly, but they have been in the 696V systolic range most recently. I've asked her to continue to monitor her BP readings, and if they remain in the 893 systolic range, she will likely need additional antihypertensive to reduce the risk of myocardial infarction and stroke. 3. Hyperlipidemia: results as noted above. Intolerant to statins. Continue gemfibrozil and red rice yeast extract. 4. Diabetes mellitus: managed carefully by Dr. Wolfgang Phoenix.  Dispo: f/u 6 months.   Kate Sable, M.D., F.A.C.C.

## 2013-08-04 ENCOUNTER — Encounter: Payer: Self-pay | Admitting: Family Medicine

## 2013-08-04 ENCOUNTER — Ambulatory Visit (INDEPENDENT_AMBULATORY_CARE_PROVIDER_SITE_OTHER): Payer: Medicare Other | Admitting: Family Medicine

## 2013-08-04 VITALS — BP 132/64 | Ht 68.0 in | Wt 165.0 lb

## 2013-08-04 DIAGNOSIS — E119 Type 2 diabetes mellitus without complications: Secondary | ICD-10-CM | POA: Diagnosis not present

## 2013-08-04 LAB — POCT GLYCOSYLATED HEMOGLOBIN (HGB A1C): Hemoglobin A1C: 6.5

## 2013-08-04 NOTE — Progress Notes (Signed)
   Subjective:    Patient ID: [name], female    DOB: 01-11-1932, 80 y.o.   MRN: [id number]  HPIDiabetic check up. Needs refill on testing strips. Test 2 - 3 times daily. Blood sugar has been up. A1C 6.5. Diabetes discussed at length. A1c's have been stable. Denies blurred vision excessive thirst. Lipid, liver, bmp was done on 07/26/13 by cardiologist.   Requesting bilateral ear irrigation    Review of Systems  Constitutional: Negative for activity change, appetite change and fatigue.  Respiratory: Negative for cough.   Cardiovascular: Negative for chest pain.  Gastrointestinal: Negative for abdominal pain.  Endocrine: Negative for polydipsia and polyphagia.  Genitourinary: Negative for frequency.  Neurological: Negative for weakness.  Psychiatric/Behavioral: Negative for confusion.       Objective:   Physical Exam  Vitals reviewed. Constitutional: She appears well-nourished. No distress.  Cardiovascular: Normal rate, regular rhythm and normal heart sounds.   No murmur heard. Pulmonary/Chest: Effort normal and breath sounds normal. No respiratory distress.  Musculoskeletal: She exhibits no edema.  Lymphadenopathy:    She has no cervical adenopathy.  Neurological: She is alert. She exhibits normal muscle tone.  Psychiatric: Her behavior is normal.         114/ 62 today's reading overall looks good Assessment & Plan:  DM-overall the patient is doing fairly well with this her A1c looks good she is frustrated that her fasting sugars are going up she will monitor this were see her back in 3 months if her A1c is going up more she may need additional medicine no additional medicine today though. Risk of additional medicine outweighs the benefit HTN-overall this is doing good continue current measures. She sees cardiology on regular basis. Ear Irrigation- nurse visit in the near future

## 2013-08-05 ENCOUNTER — Ambulatory Visit (INDEPENDENT_AMBULATORY_CARE_PROVIDER_SITE_OTHER): Payer: Medicare Other

## 2013-08-05 DIAGNOSIS — H6120 Impacted cerumen, unspecified ear: Secondary | ICD-10-CM | POA: Diagnosis not present

## 2013-08-20 ENCOUNTER — Ambulatory Visit: Payer: Medicare Other | Admitting: Family Medicine

## 2013-09-08 ENCOUNTER — Telehealth: Payer: Self-pay | Admitting: Family Medicine

## 2013-09-08 NOTE — Telephone Encounter (Signed)
Pt needs her DMV consent form filled out by the 20th of March or they must be turned in by then   See chart

## 2013-09-09 NOTE — Telephone Encounter (Signed)
Patient will be coming in next week for appointment Will fill out at that time patient aware

## 2013-09-15 ENCOUNTER — Encounter: Payer: Self-pay | Admitting: Family Medicine

## 2013-09-15 ENCOUNTER — Ambulatory Visit (INDEPENDENT_AMBULATORY_CARE_PROVIDER_SITE_OTHER): Payer: Medicare Other | Admitting: Family Medicine

## 2013-09-15 VITALS — BP 130/68 | Ht 68.0 in | Wt 170.0 lb

## 2013-09-15 DIAGNOSIS — E119 Type 2 diabetes mellitus without complications: Secondary | ICD-10-CM

## 2013-09-15 NOTE — Progress Notes (Signed)
   Subjective:    Patient ID: [name], female    DOB: Apr 10, 1932, 80 y.o.   MRN: [id number]  HPI Patient is here today to have her DMV paperwork filled out. Patient states that she has no concerns at this time. Patient states she is doing very well.    Review of Systems     Objective:   Physical Exam    15 minutes was spent with patient disc the goals of treatment plus also filling out a DMV form in her presents.ussing her diabetes the risk of hypoglycemia  greater than half the time was spent in discussion.     Assessment & Plan:  Patient will followup in 3 months  Diabetes good control. Patient not at risk driving. She is perfectly fine to drive paperwork was filled out in detail. She was going to have her eye doctor fill out part of it

## 2013-09-23 DIAGNOSIS — IMO0002 Reserved for concepts with insufficient information to code with codable children: Secondary | ICD-10-CM | POA: Diagnosis not present

## 2013-09-23 DIAGNOSIS — M1710 Unilateral primary osteoarthritis, unspecified knee: Secondary | ICD-10-CM | POA: Diagnosis not present

## 2013-09-30 DIAGNOSIS — IMO0002 Reserved for concepts with insufficient information to code with codable children: Secondary | ICD-10-CM | POA: Diagnosis not present

## 2013-09-30 DIAGNOSIS — M1710 Unilateral primary osteoarthritis, unspecified knee: Secondary | ICD-10-CM | POA: Diagnosis not present

## 2013-10-04 ENCOUNTER — Other Ambulatory Visit: Payer: Self-pay | Admitting: Family Medicine

## 2013-10-06 ENCOUNTER — Other Ambulatory Visit: Payer: Self-pay | Admitting: *Deleted

## 2013-10-06 MED ORDER — ALPRAZOLAM 0.25 MG PO TABS: 0.2500 mg | ORAL_TABLET | Freq: Four times a day (QID) | ORAL | Status: DC | PRN

## 2013-10-07 DIAGNOSIS — IMO0002 Reserved for concepts with insufficient information to code with codable children: Secondary | ICD-10-CM | POA: Diagnosis not present

## 2013-10-07 DIAGNOSIS — M1710 Unilateral primary osteoarthritis, unspecified knee: Secondary | ICD-10-CM | POA: Diagnosis not present

## 2013-10-19 ENCOUNTER — Other Ambulatory Visit: Payer: Self-pay | Admitting: Family Medicine

## 2013-10-19 DIAGNOSIS — Z1231 Encounter for screening mammogram for malignant neoplasm of breast: Secondary | ICD-10-CM

## 2013-10-28 ENCOUNTER — Ambulatory Visit (HOSPITAL_COMMUNITY)
Admission: RE | Admit: 2013-10-28 | Discharge: 2013-10-28 | Disposition: A | Discharge status: Home / Self Care | Payer: Medicare Other | Source: Ambulatory Visit | Attending: Family Medicine | Admitting: Family Medicine

## 2013-10-28 ENCOUNTER — Ambulatory Visit (HOSPITAL_COMMUNITY): Payer: Medicare Other

## 2013-10-28 DIAGNOSIS — Z1231 Encounter for screening mammogram for malignant neoplasm of breast: Secondary | ICD-10-CM | POA: Diagnosis not present

## 2013-11-01 ENCOUNTER — Other Ambulatory Visit: Payer: Self-pay | Admitting: Family Medicine

## 2013-11-01 DIAGNOSIS — R928 Other abnormal and inconclusive findings on diagnostic imaging of breast: Secondary | ICD-10-CM

## 2013-11-02 ENCOUNTER — Ambulatory Visit: Payer: Medicare Other | Admitting: Family Medicine

## 2013-11-17 ENCOUNTER — Telehealth: Payer: Self-pay | Admitting: Family Medicine

## 2013-11-17 ENCOUNTER — Ambulatory Visit (HOSPITAL_COMMUNITY)
Admission: RE | Admit: 2013-11-17 | Discharge: 2013-11-17 | Disposition: A | Discharge status: Home / Self Care | Payer: Medicare Other | Source: Ambulatory Visit | Attending: Family Medicine | Admitting: Family Medicine

## 2013-11-17 ENCOUNTER — Other Ambulatory Visit: Payer: Self-pay | Admitting: Family Medicine

## 2013-11-17 DIAGNOSIS — E782 Mixed hyperlipidemia: Secondary | ICD-10-CM

## 2013-11-17 DIAGNOSIS — R928 Other abnormal and inconclusive findings on diagnostic imaging of breast: Secondary | ICD-10-CM

## 2013-11-17 DIAGNOSIS — R922 Inconclusive mammogram: Secondary | ICD-10-CM | POA: Diagnosis not present

## 2013-11-17 DIAGNOSIS — E119 Type 2 diabetes mellitus without complications: Secondary | ICD-10-CM

## 2013-11-17 DIAGNOSIS — Z79899 Other long term (current) drug therapy: Secondary | ICD-10-CM

## 2013-11-17 NOTE — Telephone Encounter (Signed)
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 She is [place] here [place]  so as not to have to travel as much.  She has an appt with [place] 5/28 and wants to know if there is  Any bw you want ran before this appt?   Please call when done

## 2013-11-17 NOTE — Telephone Encounter (Signed)
Lipid,livcer,hg a1c, urine microprot,met 7

## 2013-11-18 DIAGNOSIS — E119 Type 2 diabetes mellitus without complications: Secondary | ICD-10-CM | POA: Diagnosis not present

## 2013-11-18 DIAGNOSIS — E782 Mixed hyperlipidemia: Secondary | ICD-10-CM | POA: Diagnosis not present

## 2013-11-18 DIAGNOSIS — Z79899 Other long term (current) drug therapy: Secondary | ICD-10-CM | POA: Diagnosis not present

## 2013-11-18 NOTE — Telephone Encounter (Signed)
bloodwork orders ready. Pt notified on voicemail.  

## 2013-11-19 LAB — HEPATIC FUNCTION PANEL
ALK PHOS: 100 U/L (ref 39–117)
ALT: 15 U/L (ref 0–35)
AST: 18 U/L (ref 0–37)
Albumin: 4.9 g/dL (ref 3.5–5.2)
BILIRUBIN TOTAL: 0.7 mg/dL (ref 0.2–1.2)
Bilirubin, Direct: 0.1 mg/dL (ref 0.0–0.3)
Indirect Bilirubin: 0.6 mg/dL (ref 0.2–1.2)
Total Protein: 7.5 g/dL (ref 6.0–8.3)

## 2013-11-19 LAB — BASIC METABOLIC PANEL
BUN: 33 mg/dL — AB (ref 6–23)
CHLORIDE: 100 meq/L (ref 96–112)
CO2: 29 mEq/L (ref 19–32)
Calcium: 10 mg/dL (ref 8.4–10.5)
Creat: 0.78 mg/dL (ref 0.50–1.10)
GLUCOSE: 135 mg/dL — AB (ref 70–99)
POTASSIUM: 5.2 meq/L (ref 3.5–5.3)
SODIUM: 141 meq/L (ref 135–145)

## 2013-11-19 LAB — LIPID PANEL
Cholesterol: 162 mg/dL (ref 0–200)
HDL: 47 mg/dL (ref >39)
LDL Cholesterol: 93 mg/dL (ref 0–99)
TRIGLYCERIDES: 112 mg/dL (ref <150)
Total CHOL/HDL Ratio: 3.4 Ratio
VLDL: 22 mg/dL (ref 0–40)

## 2013-11-19 LAB — HEMOGLOBIN A1C
Hgb A1c MFr Bld: 6.7 % — ABNORMAL HIGH (ref <5.7)
Mean Plasma Glucose: 146 mg/dL — ABNORMAL HIGH (ref <117)

## 2013-11-20 LAB — MICROALBUMIN, URINE: Microalb, Ur: 1.51 mg/dL (ref 0.00–1.89)

## 2013-11-25 ENCOUNTER — Ambulatory Visit (INDEPENDENT_AMBULATORY_CARE_PROVIDER_SITE_OTHER): Payer: Medicare Other | Admitting: Family Medicine

## 2013-11-25 ENCOUNTER — Encounter: Payer: Self-pay | Admitting: Family Medicine

## 2013-11-25 ENCOUNTER — Ambulatory Visit: Payer: Medicare Other | Admitting: Family Medicine

## 2013-11-25 VITALS — BP 130/64 | Ht 68.0 in | Wt 172.0 lb

## 2013-11-25 DIAGNOSIS — I119 Hypertensive heart disease without heart failure: Secondary | ICD-10-CM

## 2013-11-25 DIAGNOSIS — E119 Type 2 diabetes mellitus without complications: Secondary | ICD-10-CM | POA: Diagnosis not present

## 2013-11-25 DIAGNOSIS — E7800 Pure hypercholesterolemia, unspecified: Secondary | ICD-10-CM

## 2013-11-25 MED ORDER — ALPRAZOLAM 0.25 MG PO TABS: 0.2500 mg | ORAL_TABLET | Freq: Every day | ORAL | Status: DC

## 2013-11-25 MED ORDER — GEMFIBROZIL 600 MG PO TABS: 600.0000 mg | ORAL_TABLET | Freq: Every day | ORAL | Status: DC

## 2013-11-25 NOTE — Progress Notes (Signed)
   Subjective:    Patient ID: [name], female    DOB: 1931-08-19, 80 y.o.   MRN: [id number]  Diabetes She presents for her follow-up diabetic visit. She has type 2 diabetes mellitus. Pertinent negatives for hypoglycemia include no confusion. Pertinent negatives for diabetes include no chest pain, no fatigue, no polydipsia, no polyphagia and no weakness. She is compliant with treatment all of the time. She participates in exercise daily. Her breakfast blood glucose range is generally 130-140 mg/dl. She does not see a podiatrist.Eye exam is current.   Had bloodwork done 5/21. A1C 6.7 on bloodwork.  Patient states no other concerns.  The patient was seen today as part of a comprehensive diabetic check up. The patient had the following elements completed: -Review of medication compliance -Review of glucose monitoring results -Review of any complications do to high or low sugars -Diabetic foot exam was completed as part of today's visit. The following was also discussed: -Importance of yearly eye exams -Importance of following diabetic/low sugar-starch diet -Importance of exercise and regular activity -Importance of regular followup visits. -Most recent hemoglobin A1c were reviewed with the patient along with goals regarding diabetes.  Review of Systems  Constitutional: Negative for activity change, appetite change and fatigue.  HENT: Negative for congestion.   Respiratory: Negative for cough and shortness of breath.   Cardiovascular: Negative for chest pain.  Gastrointestinal: Negative for abdominal pain.  Endocrine: Negative for polydipsia and polyphagia.  Genitourinary: Negative for frequency.  Neurological: Negative for weakness.  Psychiatric/Behavioral: Negative for confusion.       Objective:   Physical Exam  Vitals reviewed. Constitutional: She appears well-nourished. No distress.  Cardiovascular: Normal rate, regular rhythm and normal heart sounds.   No murmur  heard. Pulmonary/Chest: Effort normal and breath sounds normal. No respiratory distress.  Musculoskeletal: She exhibits no edema.  Lymphadenopathy:    She has no cervical adenopathy.  Neurological: She is alert. She exhibits normal muscle tone.  Psychiatric: Her behavior is normal.          Assessment & Plan:  Brother with melanoma, patient dealing with the stress  Sister at [facility], she is dealing with the stress  Diabetic shoes-because of her bunions and diminished sensation I believe she qualifies  Hyperlipidemia doing very well reduced gemfibrozil just one tablet daily  Diabetes not as good as it had been she is to work hard on diet exercise recheck this again in 3-4 months

## 2013-12-13 ENCOUNTER — Telehealth: Payer: Self-pay | Admitting: Family Medicine

## 2013-12-13 DIAGNOSIS — M8580 Other specified disorders of bone density and structure, unspecified site: Secondary | ICD-10-CM

## 2013-12-13 NOTE — Telephone Encounter (Signed)
Patient needs order for for bone density.

## 2013-12-13 NOTE — Telephone Encounter (Signed)
Pt wanted to schedule her own appt at [facility]. Order put in for bone density test.  Pt notified

## 2013-12-22 ENCOUNTER — Ambulatory Visit (HOSPITAL_COMMUNITY)
Admission: RE | Admit: 2013-12-22 | Discharge: 2013-12-22 | Disposition: A | Discharge status: Home / Self Care | Payer: Medicare Other | Source: Ambulatory Visit | Attending: Family Medicine | Admitting: Family Medicine

## 2013-12-22 ENCOUNTER — Encounter (INDEPENDENT_AMBULATORY_CARE_PROVIDER_SITE_OTHER): Payer: Self-pay

## 2013-12-22 DIAGNOSIS — Z780 Asymptomatic menopausal state: Secondary | ICD-10-CM | POA: Diagnosis not present

## 2013-12-22 DIAGNOSIS — M899 Disorder of bone, unspecified: Secondary | ICD-10-CM | POA: Insufficient documentation

## 2013-12-22 DIAGNOSIS — M949 Disorder of cartilage, unspecified: Secondary | ICD-10-CM | POA: Diagnosis not present

## 2013-12-26 ENCOUNTER — Encounter: Payer: Self-pay | Admitting: Family Medicine

## 2014-03-10 ENCOUNTER — Telehealth: Payer: Self-pay | Admitting: Family Medicine

## 2014-03-10 NOTE — Telephone Encounter (Signed)
Does patient need order for BW for September appointment?

## 2014-03-10 NOTE — Telephone Encounter (Signed)
Notified patient she will only need hemoglobin A1c here at the office when she comes. Patient verbalized understanding.

## 2014-03-10 NOTE — Telephone Encounter (Signed)
Last labs 11/18/13 Lipid, Liver, A1C, BMP, Urine micro

## 2014-03-10 NOTE — Telephone Encounter (Signed)
The patient will only need hemoglobin A1c here at the office when she comes

## 2014-03-10 NOTE — Telephone Encounter (Signed)
Left message to return call 

## 2014-03-28 ENCOUNTER — Encounter: Payer: Self-pay | Admitting: Family Medicine

## 2014-03-28 ENCOUNTER — Ambulatory Visit (INDEPENDENT_AMBULATORY_CARE_PROVIDER_SITE_OTHER): Payer: Medicare Other | Admitting: Family Medicine

## 2014-03-28 VITALS — BP 138/60 | Ht 68.0 in | Wt 167.4 lb

## 2014-03-28 DIAGNOSIS — Z23 Encounter for immunization: Secondary | ICD-10-CM

## 2014-03-28 DIAGNOSIS — E119 Type 2 diabetes mellitus without complications: Secondary | ICD-10-CM | POA: Diagnosis not present

## 2014-03-28 LAB — POCT GLYCOSYLATED HEMOGLOBIN (HGB A1C): Hemoglobin A1C: 5.3

## 2014-03-28 MED ORDER — LOSARTAN POTASSIUM-HCTZ 100-25 MG PO TABS: ORAL_TABLET | ORAL | Status: DC

## 2014-03-28 MED ORDER — METFORMIN HCL 1000 MG PO TABS: 500.0000 mg | ORAL_TABLET | Freq: Two times a day (BID) | ORAL | Status: DC

## 2014-03-28 MED ORDER — GEMFIBROZIL 600 MG PO TABS: 600.0000 mg | ORAL_TABLET | Freq: Every day | ORAL | Status: DC

## 2014-03-28 MED ORDER — AMLODIPINE BESYLATE 10 MG PO TABS: ORAL_TABLET | ORAL | Status: DC

## 2014-03-28 NOTE — Progress Notes (Signed)
   Subjective:    Patient ID: [name], female    DOB: 07-29-1931, 80 y.o.   MRN: [id number]  Diabetes She presents for her follow-up diabetic visit. She has type 2 diabetes mellitus. Pertinent negatives for hypoglycemia include no confusion. Pertinent negatives for diabetes include no chest pain, no fatigue, no polydipsia, no polyphagia and no weakness. Risk factors for coronary artery disease include diabetes mellitus, hypertension and post-menopausal. Current diabetic treatment includes oral agent (monotherapy). She is compliant with treatment all of the time. She is following a diabetic diet.   PMH  HTN  Review of Systems  Constitutional: Negative for activity change, appetite change and fatigue.  HENT: Negative for congestion.   Respiratory: Negative for cough and shortness of breath.   Cardiovascular: Negative for chest pain.  Gastrointestinal: Negative for abdominal pain.  Endocrine: Negative for polydipsia and polyphagia.  Genitourinary: Negative for frequency.  Neurological: Negative for weakness.  Psychiatric/Behavioral: Negative for confusion.       Objective:   Physical Exam  Vitals reviewed. Constitutional: She appears well-nourished. No distress.  Cardiovascular: Normal rate, regular rhythm and normal heart sounds.   No murmur heard. Pulmonary/Chest: Effort normal and breath sounds normal. No respiratory distress.  Musculoskeletal: She exhibits no edema.  Lymphadenopathy:    She has no cervical adenopathy.  Neurological: She is alert. She exhibits normal muscle tone.  Psychiatric: Her behavior is normal.          Assessment & Plan:  Diabetes A1c looks excellent may reduce medication metformin thousand milligrams. Reduce it one half tablet twice a day. Lab work in January comprehensive.

## 2014-04-05 ENCOUNTER — Ambulatory Visit (INDEPENDENT_AMBULATORY_CARE_PROVIDER_SITE_OTHER): Payer: Medicare Other

## 2014-04-05 DIAGNOSIS — H6123 Impacted cerumen, bilateral: Secondary | ICD-10-CM

## 2014-04-11 ENCOUNTER — Other Ambulatory Visit: Payer: Self-pay | Admitting: Family Medicine

## 2014-04-12 ENCOUNTER — Telehealth: Payer: Self-pay | Admitting: *Deleted

## 2014-04-12 NOTE — Telephone Encounter (Signed)
May have one years worth of refills

## 2014-04-12 NOTE — Telephone Encounter (Signed)
rx request from [facility] for trazodone 50mg  #180 take two tablets po qhs prn. Last seen 03/28/14

## 2014-04-13 ENCOUNTER — Other Ambulatory Visit: Payer: Self-pay | Admitting: *Deleted

## 2014-04-13 DIAGNOSIS — M1711 Unilateral primary osteoarthritis, right knee: Secondary | ICD-10-CM | POA: Diagnosis not present

## 2014-04-13 MED ORDER — TRAZODONE HCL 50 MG PO TABS: ORAL_TABLET | ORAL | Status: DC

## 2014-04-13 NOTE — Telephone Encounter (Signed)
Med faxed to pharm 

## 2014-04-20 DIAGNOSIS — M1711 Unilateral primary osteoarthritis, right knee: Secondary | ICD-10-CM | POA: Diagnosis not present

## 2014-04-22 ENCOUNTER — Other Ambulatory Visit: Payer: Self-pay | Admitting: Family Medicine

## 2014-04-22 DIAGNOSIS — N6002 Solitary cyst of left breast: Secondary | ICD-10-CM

## 2014-04-27 DIAGNOSIS — M1711 Unilateral primary osteoarthritis, right knee: Secondary | ICD-10-CM | POA: Diagnosis not present

## 2014-05-24 ENCOUNTER — Other Ambulatory Visit (HOSPITAL_COMMUNITY): Payer: Medicare Other

## 2014-05-24 ENCOUNTER — Ambulatory Visit (HOSPITAL_COMMUNITY): Payer: Medicare Other

## 2014-05-31 ENCOUNTER — Ambulatory Visit (HOSPITAL_COMMUNITY)
Admission: RE | Admit: 2014-05-31 | Discharge: 2014-05-31 | Disposition: A | Discharge status: Home / Self Care | Payer: Medicare Other | Source: Ambulatory Visit | Attending: Family Medicine | Admitting: Family Medicine

## 2014-05-31 DIAGNOSIS — N6002 Solitary cyst of left breast: Secondary | ICD-10-CM | POA: Insufficient documentation

## 2014-05-31 DIAGNOSIS — N6012 Diffuse cystic mastopathy of left breast: Secondary | ICD-10-CM | POA: Diagnosis not present

## 2014-06-06 ENCOUNTER — Ambulatory Visit (INDEPENDENT_AMBULATORY_CARE_PROVIDER_SITE_OTHER): Payer: Medicare Other | Admitting: Family Medicine

## 2014-06-06 ENCOUNTER — Telehealth: Payer: Self-pay | Admitting: Family Medicine

## 2014-06-06 ENCOUNTER — Encounter: Payer: Self-pay | Admitting: Family Medicine

## 2014-06-06 VITALS — BP 120/70 | Temp 98.6°F | Ht 68.0 in | Wt 166.1 lb

## 2014-06-06 DIAGNOSIS — J0100 Acute maxillary sinusitis, unspecified: Secondary | ICD-10-CM

## 2014-06-06 DIAGNOSIS — E119 Type 2 diabetes mellitus without complications: Secondary | ICD-10-CM

## 2014-06-06 DIAGNOSIS — E785 Hyperlipidemia, unspecified: Secondary | ICD-10-CM

## 2014-06-06 DIAGNOSIS — Z79899 Other long term (current) drug therapy: Secondary | ICD-10-CM

## 2014-06-06 DIAGNOSIS — I159 Secondary hypertension, unspecified: Secondary | ICD-10-CM

## 2014-06-06 MED ORDER — AMOXICILLIN 500 MG PO TABS: 500.0000 mg | ORAL_TABLET | Freq: Three times a day (TID) | ORAL | Status: DC

## 2014-06-06 NOTE — Progress Notes (Signed)
   Subjective:    Patient ID: [name], female    DOB: [birth year], 80 y.o.   MRN: [id number]  Cough This is a new problem. The current episode started in the past 7 days. The problem has been unchanged. The cough is productive of sputum. Associated symptoms include headaches, rhinorrhea and a sore throat. Pertinent negatives include no chest pain, ear pain, fever, shortness of breath or wheezing. Nothing aggravates the symptoms. She has tried nothing for the symptoms. The treatment provided no relief.   Patient states that she has no other concerns at this time.   PMH benign, diabetes Review of Systems  Constitutional: Negative for fever and activity change.  HENT: Positive for congestion, rhinorrhea and sore throat. Negative for ear pain.   Eyes: Negative for discharge.  Respiratory: Positive for cough. Negative for shortness of breath and wheezing.   Cardiovascular: Negative for chest pain.  Neurological: Positive for headaches.       Objective:   Physical Exam  Constitutional: She appears well-developed.  HENT:  Head: Normocephalic.  Nose: Nose normal.  Mouth/Throat: Oropharynx is clear and moist. No oropharyngeal exudate.  Neck: Neck supple.  Cardiovascular: Normal rate and normal heart sounds.   No murmur heard. Pulmonary/Chest: Effort normal and breath sounds normal. She has no wheezes.  Lymphadenopathy:    She has no cervical adenopathy.  Skin: Skin is warm and dry.  Nursing note and vitals reviewed.         Assessment & Plan:  Upper respiratory illness was secondary sinusitis antibiotics prescribed warning signs discussed follow-up if problems

## 2014-06-06 NOTE — Telephone Encounter (Signed)
Pt has appt 07/11/14 @ 9:00am for diabetes recheck, she asked upon check out if she would need lab work, please advise

## 2014-06-06 NOTE — Telephone Encounter (Signed)
Lip liv met 7, A1C

## 2014-06-06 NOTE — Telephone Encounter (Signed)
Patient notified and verbalized understanding. 

## 2014-06-08 ENCOUNTER — Other Ambulatory Visit: Payer: Self-pay | Admitting: *Deleted

## 2014-06-08 ENCOUNTER — Telehealth: Payer: Self-pay | Admitting: *Deleted

## 2014-06-08 MED ORDER — BENZONATATE 200 MG PO CAPS: 200.0000 mg | ORAL_CAPSULE | Freq: Three times a day (TID) | ORAL | Status: DC | PRN

## 2014-06-08 NOTE — Telephone Encounter (Signed)
Pt denies any difficulty w/ breathing, no fever, just a dry, nagging cough. Per Dr Nicki Reaper, sent in [place]. Pt notified and verbalized understanding.

## 2014-06-08 NOTE — Telephone Encounter (Signed)
Pt called stating she was seen Monday and the amoxcillin is helping her throat, pt said she has a really bad cough pt is requesting something for her cough. Please advise [phone]

## 2014-06-10 ENCOUNTER — Telehealth: Payer: Self-pay | Admitting: Family Medicine

## 2014-06-10 NOTE — Telephone Encounter (Signed)
Per Hoyle Sauer, sounds viral and post nasal drip causing the nagging cough. Recommend Mucinex DM. Warning signs discussed. Will go to ER or Urgent Care over weekend if not better.

## 2014-06-10 NOTE — Telephone Encounter (Signed)
Patient called feeling worse today ,states still coughing ,has no energy and cant laid down to sleep has to setup to sleep. [facility] [place]

## 2014-06-15 ENCOUNTER — Ambulatory Visit (INDEPENDENT_AMBULATORY_CARE_PROVIDER_SITE_OTHER): Payer: Medicare Other | Admitting: Family Medicine

## 2014-06-15 ENCOUNTER — Encounter: Payer: Self-pay | Admitting: Family Medicine

## 2014-06-15 ENCOUNTER — Ambulatory Visit (HOSPITAL_COMMUNITY)
Admission: RE | Admit: 2014-06-15 | Discharge: 2014-06-15 | Disposition: A | Discharge status: Home / Self Care | Payer: Medicare Other | Source: Ambulatory Visit | Attending: Family Medicine | Admitting: Family Medicine

## 2014-06-15 VITALS — BP 130/60 | Temp 97.9°F | Ht 68.0 in | Wt 164.1 lb

## 2014-06-15 DIAGNOSIS — R05 Cough: Secondary | ICD-10-CM | POA: Diagnosis not present

## 2014-06-15 DIAGNOSIS — J189 Pneumonia, unspecified organism: Secondary | ICD-10-CM | POA: Diagnosis not present

## 2014-06-15 DIAGNOSIS — R509 Fever, unspecified: Secondary | ICD-10-CM

## 2014-06-15 DIAGNOSIS — J479 Bronchiectasis, uncomplicated: Secondary | ICD-10-CM | POA: Diagnosis not present

## 2014-06-15 DIAGNOSIS — I517 Cardiomegaly: Secondary | ICD-10-CM | POA: Diagnosis not present

## 2014-06-15 DIAGNOSIS — R0602 Shortness of breath: Secondary | ICD-10-CM | POA: Diagnosis not present

## 2014-06-15 DIAGNOSIS — J984 Other disorders of lung: Secondary | ICD-10-CM | POA: Insufficient documentation

## 2014-06-15 DIAGNOSIS — R0600 Dyspnea, unspecified: Secondary | ICD-10-CM

## 2014-06-15 MED ORDER — CEFTRIAXONE SODIUM 1 G IJ SOLR
1.0000 g | Freq: Once | INTRAMUSCULAR | Status: AC
  Administered 2014-06-15: 1 g via INTRAMUSCULAR

## 2014-06-15 MED ORDER — AZITHROMYCIN 250 MG PO TABS: ORAL_TABLET | ORAL | Status: DC

## 2014-06-15 NOTE — Progress Notes (Signed)
   Subjective:    Patient ID: [name], female    DOB: 06-22-1932, 80 y.o.   MRN: [id number]  Cough This is a new problem. The problem has been gradually worsening. The problem occurs constantly. The cough is non-productive. Associated symptoms include rhinorrhea and shortness of breath. Pertinent negatives include no chest pain, ear pain, fever or wheezing. Associated symptoms comments: congestion. Nothing aggravates the symptoms. She has tried prescription cough suppressant (amoxicillin) for the symptoms. The treatment provided no relief.   Patient states that she has no other concerns at this time.     Review of Systems  Constitutional: Negative for fever and activity change.  HENT: Positive for congestion and rhinorrhea. Negative for ear pain.   Eyes: Negative for discharge.  Respiratory: Positive for cough and shortness of breath. Negative for wheezing.   Cardiovascular: Negative for chest pain.       Objective:   Physical Exam  Constitutional: She appears well-developed.  HENT:  Head: Normocephalic.  Nose: Nose normal.  Mouth/Throat: Oropharynx is clear and moist. No oropharyngeal exudate.  Neck: Neck supple.  Cardiovascular: Normal rate and normal heart sounds.   No murmur heard. Pulmonary/Chest: Effort normal. She has no wheezes.  She has significant chest congestion noted in the bases worse on the left than the right consistent with the probability of severe bronchitis per versus the possibility of early pneumonia  Lymphadenopathy:    She has no cervical adenopathy.  Skin: Skin is warm and dry.  Nursing note and vitals reviewed.         Assessment & Plan:  Severe bronchitis versus community-acquired pneumonia-Zithromax, Rocephin shot, chest x-ray ordered. Patient was told if she starts having dyspnea as severe shortness of breath or high fevers immediately go to ER call 911. Recheck in 48 hours

## 2014-06-17 ENCOUNTER — Ambulatory Visit (INDEPENDENT_AMBULATORY_CARE_PROVIDER_SITE_OTHER): Payer: Medicare Other | Admitting: Family Medicine

## 2014-06-17 ENCOUNTER — Encounter: Payer: Self-pay | Admitting: Family Medicine

## 2014-06-17 VITALS — BP 120/64 | Temp 98.1°F | Ht 68.0 in | Wt 164.0 lb

## 2014-06-17 DIAGNOSIS — J189 Pneumonia, unspecified organism: Secondary | ICD-10-CM | POA: Diagnosis not present

## 2014-06-17 MED ORDER — CEFPROZIL 500 MG PO TABS: 500.0000 mg | ORAL_TABLET | Freq: Two times a day (BID) | ORAL | Status: DC

## 2014-06-17 NOTE — Progress Notes (Signed)
   Subjective:    Patient ID: [name], female    DOB: 01-16-1932, 80 y.o.   MRN: [id number]  HPI Patient is here today for a recheck on her cough.  She said she is feeling much better, but she cannot get rid of the cough. It is still keeping her up at night.  Patient relates cough congestion still feels very weak run down. No nausea vomiting or diarrhea. Please see previous note.   Review of Systems  Constitutional: Negative for fever and activity change.  HENT: Positive for rhinorrhea. Negative for congestion and ear pain.   Eyes: Negative for discharge.  Respiratory: Positive for cough. Negative for shortness of breath and wheezing.   Cardiovascular: Negative for chest pain.       Objective:   Physical Exam  Constitutional: She appears well-developed.  HENT:  Head: Normocephalic.  Nose: Nose normal.  Mouth/Throat: Oropharynx is clear and moist. No oropharyngeal exudate.  Neck: Neck supple.  Cardiovascular: Normal rate and normal heart sounds.   No murmur heard. Pulmonary/Chest: Effort normal. She has no wheezes.  Coarse breath sounds in the bases  Lymphadenopathy:    She has no cervical adenopathy.  Skin: Skin is warm and dry.  Nursing note and vitals reviewed.  social he does have some basal lung issues in regards to congestion I don't hear any true rails or rhonchi this could be early pneumonia but her x-ray was negative      Assessment & Plan:  Go forward with treatment with finishing the Zithromax and Cefzil warning signs regarding complications discuss if not doing currently better by next week follow-up if high fever shortness of breath or worse go to ER immediately

## 2014-06-19 ENCOUNTER — Encounter (HOSPITAL_COMMUNITY): Payer: Self-pay | Admitting: Emergency Medicine

## 2014-06-19 ENCOUNTER — Emergency Department (HOSPITAL_COMMUNITY)
Admission: EM | Admit: 2014-06-19 | Discharge: 2014-06-19 | Disposition: A | Discharge status: Home / Self Care | Payer: Medicare Other | Attending: Emergency Medicine | Admitting: Emergency Medicine

## 2014-06-19 ENCOUNTER — Emergency Department (HOSPITAL_COMMUNITY): Payer: Medicare Other

## 2014-06-19 DIAGNOSIS — R5383 Other fatigue: Secondary | ICD-10-CM | POA: Insufficient documentation

## 2014-06-19 DIAGNOSIS — E860 Dehydration: Secondary | ICD-10-CM | POA: Insufficient documentation

## 2014-06-19 DIAGNOSIS — Z7982 Long term (current) use of aspirin: Secondary | ICD-10-CM | POA: Diagnosis not present

## 2014-06-19 DIAGNOSIS — R05 Cough: Secondary | ICD-10-CM | POA: Insufficient documentation

## 2014-06-19 DIAGNOSIS — Z8719 Personal history of other diseases of the digestive system: Secondary | ICD-10-CM | POA: Diagnosis not present

## 2014-06-19 DIAGNOSIS — J029 Acute pharyngitis, unspecified: Secondary | ICD-10-CM | POA: Insufficient documentation

## 2014-06-19 DIAGNOSIS — Z79899 Other long term (current) drug therapy: Secondary | ICD-10-CM | POA: Diagnosis not present

## 2014-06-19 DIAGNOSIS — R059 Cough, unspecified: Secondary | ICD-10-CM

## 2014-06-19 DIAGNOSIS — R0981 Nasal congestion: Secondary | ICD-10-CM

## 2014-06-19 DIAGNOSIS — I10 Essential (primary) hypertension: Secondary | ICD-10-CM | POA: Diagnosis not present

## 2014-06-19 DIAGNOSIS — J40 Bronchitis, not specified as acute or chronic: Secondary | ICD-10-CM | POA: Diagnosis not present

## 2014-06-19 DIAGNOSIS — R0989 Other specified symptoms and signs involving the circulatory and respiratory systems: Secondary | ICD-10-CM | POA: Diagnosis not present

## 2014-06-19 DIAGNOSIS — R0600 Dyspnea, unspecified: Secondary | ICD-10-CM

## 2014-06-19 DIAGNOSIS — E785 Hyperlipidemia, unspecified: Secondary | ICD-10-CM | POA: Diagnosis not present

## 2014-06-19 DIAGNOSIS — E119 Type 2 diabetes mellitus without complications: Secondary | ICD-10-CM | POA: Diagnosis not present

## 2014-06-19 DIAGNOSIS — Z792 Long term (current) use of antibiotics: Secondary | ICD-10-CM | POA: Diagnosis not present

## 2014-06-19 DIAGNOSIS — M1990 Unspecified osteoarthritis, unspecified site: Secondary | ICD-10-CM | POA: Insufficient documentation

## 2014-06-19 DIAGNOSIS — Z9889 Other specified postprocedural states: Secondary | ICD-10-CM | POA: Insufficient documentation

## 2014-06-19 LAB — CBC WITH DIFFERENTIAL/PLATELET
BASOS ABS: 0 10*3/uL (ref 0.0–0.1)
Basophils Relative: 0 % (ref 0–1)
EOS ABS: 0.1 10*3/uL (ref 0.0–0.7)
EOS PCT: 2 % (ref 0–5)
HEMATOCRIT: 36.6 % (ref 36.0–46.0)
Hemoglobin: 12 g/dL (ref 12.0–15.0)
Lymphocytes Relative: 26 % (ref 12–46)
Lymphs Abs: 2 10*3/uL (ref 0.7–4.0)
MCH: 29.9 pg (ref 26.0–34.0)
MCHC: 32.8 g/dL (ref 30.0–36.0)
MCV: 91.3 fL (ref 78.0–100.0)
MONO ABS: 0.5 10*3/uL (ref 0.1–1.0)
Monocytes Relative: 6 % (ref 3–12)
Neutro Abs: 4.9 10*3/uL (ref 1.7–7.7)
Neutrophils Relative %: 66 % (ref 43–77)
PLATELETS: 364 10*3/uL (ref 150–400)
RBC: 4.01 MIL/uL (ref 3.87–5.11)
RDW: 12.7 % (ref 11.5–15.5)
WBC: 7.5 10*3/uL (ref 4.0–10.5)

## 2014-06-19 LAB — BASIC METABOLIC PANEL
ANION GAP: 15 (ref 5–15)
BUN: 26 mg/dL — ABNORMAL HIGH (ref 6–23)
CALCIUM: 10.6 mg/dL — AB (ref 8.4–10.5)
CO2: 25 mEq/L (ref 19–32)
Chloride: 100 mEq/L (ref 96–112)
Creatinine, Ser: 0.97 mg/dL (ref 0.50–1.10)
GFR calc Af Amer: 62 mL/min — ABNORMAL LOW (ref >90)
GFR, EST NON AFRICAN AMERICAN: 54 mL/min — AB (ref >90)
Glucose, Bld: 103 mg/dL — ABNORMAL HIGH (ref 70–99)
Potassium: 4.4 mEq/L (ref 3.7–5.3)
SODIUM: 140 meq/L (ref 137–147)

## 2014-06-19 LAB — PRO B NATRIURETIC PEPTIDE: Pro B Natriuretic peptide (BNP): 109.9 pg/mL (ref 0–450)

## 2014-06-19 MED ORDER — SODIUM CHLORIDE 0.9 % IV BOLUS (SEPSIS)
1000.0000 mL | Freq: Once | INTRAVENOUS | Status: AC
  Administered 2014-06-19: 1000 mL via INTRAVENOUS

## 2014-06-19 NOTE — ED Provider Notes (Signed)
CSN: [id number]     Arrival date & time 06/19/14  4158 History   First MD Initiated Contact with Patient 06/19/14 1112     No chief complaint on file.    (Consider location/radiation/quality/duration/timing/severity/associated sxs/prior Treatment) HPI Comments: 80 year old female with history of diabetes cholesterol, high blood pressure presents with recurrent cough congestion. Patient's had symptoms for probably 10 days and is completed 2 courses of antibiotics with no improvement. Patient said started with a mild sore throat and then has had congestion and cough since. Patient had chest x-ray and evaluation by primary doctor and antibody ordered. Patient feels generally weak compared to baseline however still able to drink and do daily activities. Patient's son is with her in the ER.  The history is provided by the patient.    Past Medical History  Diagnosis Date  . Hypertension   . Diabetes mellitus   . Hyperlipidemia   . Osteoarthritis   . History of hysterectomy   . Diverticulitis     multilple surgeries  . Hypertension 02/13/2011  . Gallstones    Past Surgical History  Procedure Laterality Date  . Appendectomy    . Breast reduction surgery    . Knee surgery    . Refractive surgery    . Cardiac catheterization  06/01/2003    EF is between 60-65% -- Smooth and normal coronary arteries -- Normal left ventricular systolic function  -- We will continue with medical therapy for her hypertensio  . Laparoscopic incisional / umbilical / ventral hernia repair  02/23/2007   . Colon surgery      left  . Abdominal hysterectomy    . Eye surgery  03/30/2009    cataract   Family History  Problem Relation Age of Onset  . Cancer Mother     ovaian  . Hypertension Father     kidney  . Stroke Father   . Cancer Father   . Diabetes Brother   . Hypertension Sister    History  Substance Use Topics  . Smoking status: Never Smoker   . Smokeless tobacco: Never Used  . Alcohol Use: No    OB History    No data available     Review of Systems  Constitutional: Positive for fatigue. Negative for fever and chills.  HENT: Positive for congestion and sore throat.   Eyes: Negative for visual disturbance.  Respiratory: Positive for cough. Negative for shortness of breath.   Cardiovascular: Negative for chest pain.  Gastrointestinal: Negative for vomiting and abdominal pain.  Genitourinary: Negative for dysuria and flank pain.  Musculoskeletal: Negative for back pain, neck pain and neck stiffness.  Skin: Negative for rash.  Neurological: Negative for light-headedness and headaches.      Allergies  Phenergan; Prednisone; and Statins  Home Medications   Prior to Admission medications   Medication Sig Start Date End Date Taking? Authorizing Provider  ALPRAZolam (XANAX) 0.25 MG tablet Take 1 tablet (0.25 mg total) by mouth at bedtime. 11/25/13  Yes Kathyrn Drown, MD  amLODipine (NORVASC) 10 MG tablet TAKE ONE TABLET BY MOUTH ONCE DAILY 03/28/14  Yes Kathyrn Drown, MD  aspirin 81 MG tablet Take 81 mg by mouth daily.     Yes Historical Provider, MD  beta carotene w/minerals (OCUVITE) tablet Take 1 tablet by mouth daily.   Yes Historical Provider, MD  cefPROZIL (CEFZIL) 500 MG tablet Take 1 tablet (500 mg total) by mouth 2 (two) times daily. 06/17/14  Yes Kathyrn Drown, MD  CINNAMON PO Take 1 capsule by mouth daily.    Yes Historical Provider, MD  gemfibrozil (LOPID) 600 MG tablet Take 1 tablet (600 mg total) by mouth daily. 03/28/14  Yes Kathyrn Drown, MD  losartan-hydrochlorothiazide (HYZAAR) 100-25 MG per tablet TAKE ONE TABLET BY MOUTH ONCE DAILY 03/28/14  Yes Kathyrn Drown, MD  metFORMIN (GLUCOPHAGE) 1000 MG tablet Take 0.5 tablets (500 mg total) by mouth 2 (two) times daily. 03/28/14  Yes Kathyrn Drown, MD  multivitamin [facility]) per tablet Take 1 tablet by mouth daily.     Yes Historical Provider, MD  polyvinyl alcohol (LIQUIFILM TEARS) 1.4 % ophthalmic solution  Place 1 drop into both eyes 2 (two) times daily.   Yes Historical Provider, MD  Red Yeast Rice Extract (RED YEAST RICE PO) Take 1 capsule by mouth 2 (two) times daily.     Yes Historical Provider, MD  traZODone (DESYREL) 50 MG tablet TAKE TWO TABLETS BY MOUTH AT BEDTIME AS NEEDED Patient taking differently: Take 100 mg by mouth at bedtime as needed for sleep.  04/13/14  Yes Kathyrn Drown, MD  azithromycin (ZITHROMAX Z-PAK) 250 MG tablet Take 2 tablets (500 mg) on  Day 1,  followed by 1 tablet (250 mg) once daily on Days 2 through 5. Patient not taking: Reported on 06/19/2014 06/15/14   Kathyrn Drown, MD   BP 139/100 mmHg  Pulse 84  Temp(Src) 97.8 F (36.6 C) (Oral)  Resp 17  SpO2 98% Physical Exam  Constitutional: She is oriented to person, place, and time. She appears well-developed and well-nourished.  HENT:  Head: Normocephalic and atraumatic.  Dry mucous membrane  Eyes: Conjunctivae are normal. Right eye exhibits no discharge. Left eye exhibits no discharge.  Neck: Normal range of motion. Neck supple. No tracheal deviation present.  Cardiovascular: Normal rate and regular rhythm.   Pulmonary/Chest: Effort normal and breath sounds normal.  Abdominal: Soft. She exhibits no distension. There is no tenderness. There is no guarding.  Musculoskeletal: She exhibits no edema.  Neurological: She is alert and oriented to person, place, and time.  Skin: Skin is warm. No rash noted.  Psychiatric: She has a normal mood and affect.  Nursing note and vitals reviewed.   ED Course  Procedures (including critical care time) Labs Review Labs Reviewed  BASIC METABOLIC PANEL - Abnormal; Notable for the following:    Glucose, Bld 103 (*)    BUN 26 (*)    Calcium 10.6 (*)    GFR calc non Af Amer 54 (*)    GFR calc Af Amer 62 (*)    All other components within normal limits  CBC WITH DIFFERENTIAL  PRO B NATRIURETIC PEPTIDE    Imaging Review Dg Chest 2 View  06/19/2014   CLINICAL DATA:   Pt c/o cough and congestion x 2 weeks, states she has been seen for the same symptoms for 3 weeks,  EXAM: CHEST  2 VIEW  COMPARISON:  06/15/2014  FINDINGS: There is bibasilar fibrosis. There is no focal parenchymal opacity, pleural effusion, or pneumothorax. The heart and mediastinal contours are unremarkable.  The osseous structures are unremarkable.  IMPRESSION: No active cardiopulmonary disease.   Electronically Signed   By: Kathreen Devoid   On: 06/19/2014 14:46     EKG Interpretation None      MDM   Final diagnoses:  Cough  Congestion of nasal sinus  Dehydration Bronchitis  Patient with recurrent cough and congestion, clinically mild dehydration. IV fluid bolus given.  Patient improved on reassessment, tolerating meal. Discussed reassessment in 1 week and minimizing cough suppressants. Chest x-ray repeat no acute pneumonia. Discussed likely viral process and if no improvement patient may need CT scan outpatient.  Results and differential diagnosis were discussed with the patient/parent/guardian. Close follow up outpatient was discussed, comfortable with the plan.   Medications  sodium chloride 0.9 % bolus 1,000 mL (0 mLs Intravenous Stopped 06/19/14 1359)    Filed Vitals:   06/19/14 1000 06/19/14 1146 06/19/14 1248 06/19/14 1501  BP: 133/54  127/56 139/100  Pulse: 74  57 84  Temp: 97.8 F (36.6 C)     TempSrc: Oral     Resp: 18  18 17   SpO2: 100% 99% 98% 98%    Final diagnoses:  Cough  Congestion of nasal sinus       Mariea Clonts, MD 06/19/14 [phone]

## 2014-06-19 NOTE — Discharge Instructions (Signed)
If you were given medicines take as directed.  If you are on coumadin or contraceptives realize their levels and effectiveness is altered by many different medicines.  If you have any reaction (rash, tongues swelling, other) to the medicines stop taking and see a physician.   Please follow up as directed and return to the ER or see a physician for new or worsening symptoms.  Thank you. Filed Vitals:   06/19/14 1000 06/19/14 1146 06/19/14 1248  BP: 133/54  127/56  Pulse: 74  57  Temp: 97.8 F (36.6 C)    TempSrc: Oral    Resp: 18  18  SpO2: 100% 99% 98%

## 2014-06-19 NOTE — ED Notes (Signed)
Pt c/o cough and congestion x 2 weeks, states she has been seen for the same for 3 weeks, PCP states patient's x-rays do not show pneumonia but due to congestion at base of her lungs, he planned to treat it as pneumonia. Pt's PCP gave her Amoxicillin on Monday, gave her Z-pack on Wednesday, on Friday was given Ciprozil. Pt states she feels weaker every day.

## 2014-06-19 NOTE — ED Notes (Signed)
sts feels like not getting a full breath on inspiration and then cough.  Cough has been productive but today not getting anything up.  This past Wednesday had Xray with bilateral fluid in bases.  Pt being treated for PNA but X ray did not show PNA per PCP,  Pt has been on several Abx and still getting weaker.

## 2014-06-22 ENCOUNTER — Telehealth: Payer: Self-pay

## 2014-06-22 NOTE — Telephone Encounter (Signed)
Pt called wanting to let the Dr. Gwyndolyn Kaufman that she went to the ER on Sunday. Pt stated that she was dehydrated and that they gave her IV fluids.

## 2014-06-22 NOTE — Telephone Encounter (Signed)
Just an FYI. Pt states she is feeling much better. Sunday she got up and could hardly walk because she was weak so she went to ER.

## 2014-07-05 DIAGNOSIS — Z79899 Other long term (current) drug therapy: Secondary | ICD-10-CM | POA: Diagnosis not present

## 2014-07-05 DIAGNOSIS — E119 Type 2 diabetes mellitus without complications: Secondary | ICD-10-CM | POA: Diagnosis not present

## 2014-07-05 DIAGNOSIS — E785 Hyperlipidemia, unspecified: Secondary | ICD-10-CM | POA: Diagnosis not present

## 2014-07-06 LAB — LIPID PANEL
Cholesterol: 155 mg/dL (ref 0–200)
HDL: 47 mg/dL (ref >39)
LDL Cholesterol: 86 mg/dL (ref 0–99)
TRIGLYCERIDES: 108 mg/dL (ref <150)
Total CHOL/HDL Ratio: 3.3 Ratio
VLDL: 22 mg/dL (ref 0–40)

## 2014-07-06 LAB — HEPATIC FUNCTION PANEL
ALK PHOS: 95 U/L (ref 39–117)
ALT: 14 U/L (ref 0–35)
AST: 16 U/L (ref 0–37)
Albumin: 4.3 g/dL (ref 3.5–5.2)
Bilirubin, Direct: 0.1 mg/dL (ref 0.0–0.3)
Indirect Bilirubin: 0.5 mg/dL (ref 0.2–1.2)
Total Bilirubin: 0.6 mg/dL (ref 0.2–1.2)
Total Protein: 6.8 g/dL (ref 6.0–8.3)

## 2014-07-06 LAB — BASIC METABOLIC PANEL
BUN: 20 mg/dL (ref 6–23)
CALCIUM: 10.1 mg/dL (ref 8.4–10.5)
CO2: 28 mEq/L (ref 19–32)
Chloride: 101 mEq/L (ref 96–112)
Creat: 0.84 mg/dL (ref 0.50–1.10)
GLUCOSE: 127 mg/dL — AB (ref 70–99)
Potassium: 4.5 mEq/L (ref 3.5–5.3)
Sodium: 140 mEq/L (ref 135–145)

## 2014-07-06 LAB — HEMOGLOBIN A1C
Hgb A1c MFr Bld: 6.3 % — ABNORMAL HIGH (ref <5.7)
Mean Plasma Glucose: 134 mg/dL — ABNORMAL HIGH (ref <117)

## 2014-07-11 ENCOUNTER — Encounter: Payer: Self-pay | Admitting: Family Medicine

## 2014-07-11 ENCOUNTER — Ambulatory Visit (INDEPENDENT_AMBULATORY_CARE_PROVIDER_SITE_OTHER): Payer: Medicare Other | Admitting: Family Medicine

## 2014-07-11 VITALS — BP 120/62 | Ht 68.0 in | Wt 167.0 lb

## 2014-07-11 DIAGNOSIS — E119 Type 2 diabetes mellitus without complications: Secondary | ICD-10-CM

## 2014-07-11 DIAGNOSIS — E7800 Pure hypercholesterolemia, unspecified: Secondary | ICD-10-CM

## 2014-07-11 NOTE — Progress Notes (Signed)
   Subjective:    Patient ID: [name], female    DOB: 01/02/1932, 81 y.o.   MRN: [id number]  Diabetes She presents for her follow-up diabetic visit. She has type 2 diabetes mellitus. There are no hypoglycemic associated symptoms. Pertinent negatives for hypoglycemia include no confusion. There are no diabetic associated symptoms. Pertinent negatives for diabetes include no fatigue, no polydipsia, no polyphagia and no weakness. Symptoms are stable. Current diabetic treatment includes oral agent (monotherapy). She is compliant with treatment all of the time. She monitors blood glucose at home 1-2 x per day. Blood glucose monitoring compliance is excellent. Her highest blood glucose is 140-180 mg/dl. Her overall blood glucose range is 140-180 mg/dl. Eye exam current: Appt on 07/28/14.   she will have the eye doctor send [place] a copy  Wants to go over her BW results    Review of Systems  Constitutional: Negative for activity change, appetite change and fatigue.  Endocrine: Negative for polydipsia and polyphagia.  Genitourinary: Negative for frequency.  Neurological: Negative for weakness.  Psychiatric/Behavioral: Negative for confusion.       Objective:   Physical Exam  Constitutional: She appears well-nourished. No distress.  Cardiovascular: Normal rate, regular rhythm and normal heart sounds.   No murmur heard. Pulmonary/Chest: Effort normal and breath sounds normal. No respiratory distress.  Musculoskeletal: She exhibits no edema.  Lymphadenopathy:    She has no cervical adenopathy.  Neurological: She is alert. She exhibits normal muscle tone.  Psychiatric: Her behavior is normal.  Vitals reviewed.         Assessment & Plan:  Lab work favorable Continue current medications Watch diet closely Recheck again in 3-4 months

## 2014-07-11 NOTE — Patient Instructions (Signed)
Diabetes Mellitus and Food It is important for you to manage your blood sugar (glucose) level. Your blood glucose level can be greatly affected by what you eat. Eating healthier foods in the appropriate amounts throughout the day at about the same time each day will help you control your blood glucose level. It can also help slow or prevent worsening of your diabetes mellitus. Healthy eating may even help you improve the level of your blood pressure and reach or maintain a healthy weight.  HOW CAN FOOD AFFECT ME? Carbohydrates Carbohydrates affect your blood glucose level more than any other type of food. Your dietitian will help you determine how many carbohydrates to eat at each meal and teach you how to count carbohydrates. Counting carbohydrates is important to keep your blood glucose at a healthy level, especially if you are using insulin or taking certain medicines for diabetes mellitus. Alcohol Alcohol can cause sudden decreases in blood glucose (hypoglycemia), especially if you use insulin or take certain medicines for diabetes mellitus. Hypoglycemia can be a life-threatening condition. Symptoms of hypoglycemia (sleepiness, dizziness, and disorientation) are similar to symptoms of having too much alcohol.  If your health care provider has given you approval to drink alcohol, do so in moderation and use the following guidelines:  Women should not have more than one drink per day, and men should not have more than two drinks per day. One drink is equal to:  12 oz of beer.  5 oz of wine.  1 oz of hard liquor.  Do not drink on an empty stomach.  Keep yourself hydrated. Have water, diet soda, or unsweetened iced tea.  Regular soda, juice, and other mixers might contain a lot of carbohydrates and should be counted. WHAT FOODS ARE NOT RECOMMENDED? As you make food choices, it is important to remember that all foods are not the same. Some foods have fewer nutrients per serving than other  foods, even though they might have the same number of calories or carbohydrates. It is difficult to get your body what it needs when you eat foods with fewer nutrients. Examples of foods that you should avoid that are high in calories and carbohydrates but low in nutrients include:  Trans fats (most processed foods list trans fats on the Nutrition Facts label).  Regular soda.  Juice.  Candy.  Sweets, such as cake, pie, doughnuts, and cookies.  Fried foods. WHAT FOODS CAN I EAT? Have nutrient-rich foods, which will nourish your body and keep you healthy. The food you should eat also will depend on several factors, including:  The calories you need.  The medicines you take.  Your weight.  Your blood glucose level.  Your blood pressure level.  Your cholesterol level. You also should eat a variety of foods, including:  Protein, such as meat, poultry, fish, tofu, nuts, and seeds (lean animal proteins are best).  Fruits.  Vegetables.  Dairy products, such as milk, cheese, and yogurt (low fat is best).  Breads, grains, pasta, cereal, rice, and beans.  Fats such as olive oil, trans fat-free margarine, canola oil, avocado, and olives. DOES EVERYONE WITH DIABETES MELLITUS HAVE THE SAME MEAL PLAN? Because every person with diabetes mellitus is different, there is not one meal plan that works for everyone. It is very important that you meet with a dietitian who will help you create a meal plan that is just right for you. Document Released: 03/14/2005 Document Revised: 06/22/2013 Document Reviewed: 05/14/2013 [employer] Patient Information 2015 [employer]. This   information is not intended to replace advice given to you by your health care provider. Make sure you discuss any questions you have with your health care provider.  

## 2014-08-01 ENCOUNTER — Other Ambulatory Visit: Payer: Self-pay | Admitting: Family Medicine

## 2014-08-01 LAB — HM DIABETES EYE EXAM

## 2014-08-01 NOTE — Telephone Encounter (Signed)
She may have this +5 refills 

## 2014-08-02 DIAGNOSIS — E11319 Type 2 diabetes mellitus with unspecified diabetic retinopathy without macular edema: Secondary | ICD-10-CM | POA: Diagnosis not present

## 2014-09-12 ENCOUNTER — Other Ambulatory Visit: Payer: Self-pay | Admitting: *Deleted

## 2014-09-12 ENCOUNTER — Telehealth: Payer: Self-pay | Admitting: Family Medicine

## 2014-09-12 MED ORDER — METFORMIN HCL 1000 MG PO TABS: 1000.0000 mg | ORAL_TABLET | Freq: Two times a day (BID) | ORAL | Status: DC

## 2014-09-12 NOTE — Telephone Encounter (Signed)
Pt called stating that Dr. Nicki Reaper had decreased her metformin on Jan 11. Pt states that her blood sugars are now running anywhere from 150 to the 160's. Pt wants to know if he would want her to go back to taking 1000 twice a day instead of the 500 twice a day that she is on now.

## 2014-09-12 NOTE — Telephone Encounter (Signed)
Discussed with pt. Med sent to pharm.  

## 2014-09-12 NOTE — Telephone Encounter (Signed)
Nurses, you may go ahead and increase her Glucophage to 1000 mg twice a day. She will need a new 90 day prescription with one refill, follow-up office visit in mid April, notify us if ongoing troubles

## 2014-10-31 ENCOUNTER — Telehealth: Payer: Self-pay | Admitting: Family Medicine

## 2014-10-31 DIAGNOSIS — Z79899 Other long term (current) drug therapy: Secondary | ICD-10-CM

## 2014-10-31 DIAGNOSIS — E785 Hyperlipidemia, unspecified: Secondary | ICD-10-CM

## 2014-10-31 DIAGNOSIS — E119 Type 2 diabetes mellitus without complications: Secondary | ICD-10-CM

## 2014-10-31 NOTE — Telephone Encounter (Signed)
Last Labs  07/05/14 Hep func, Lip, BMP, A1C  Would like to go in the AM if we can send them in today   Call when sent

## 2014-10-31 NOTE — Telephone Encounter (Signed)
Lipid, liver, metabolic 7, hemoglobin F4F, urine micro-protein

## 2014-10-31 NOTE — Telephone Encounter (Signed)
Left message on voicemail notifying patient that blood work has been ordered.  

## 2014-11-01 DIAGNOSIS — E785 Hyperlipidemia, unspecified: Secondary | ICD-10-CM | POA: Diagnosis not present

## 2014-11-01 DIAGNOSIS — Z79899 Other long term (current) drug therapy: Secondary | ICD-10-CM | POA: Diagnosis not present

## 2014-11-01 DIAGNOSIS — E119 Type 2 diabetes mellitus without complications: Secondary | ICD-10-CM | POA: Diagnosis not present

## 2014-11-02 LAB — HEMOGLOBIN A1C
Est. average glucose Bld gHb Est-mCnc: 151 mg/dL
Hgb A1c MFr Bld: 6.9 % — ABNORMAL HIGH (ref 4.8–5.6)

## 2014-11-02 LAB — BASIC METABOLIC PANEL
BUN/Creatinine Ratio: 26 (ref 11–26)
BUN: 25 mg/dL (ref 8–27)
CALCIUM: 10.3 mg/dL (ref 8.7–10.3)
CO2: 23 mmol/L (ref 18–29)
Chloride: 99 mmol/L (ref 97–108)
Creatinine, Ser: 0.97 mg/dL (ref 0.57–1.00)
GFR calc Af Amer: 63 mL/min/{1.73_m2} (ref >59)
GFR calc non Af Amer: 55 mL/min/{1.73_m2} — ABNORMAL LOW (ref >59)
GLUCOSE: 153 mg/dL — AB (ref 65–99)
POTASSIUM: 4.8 mmol/L (ref 3.5–5.2)
Sodium: 141 mmol/L (ref 134–144)

## 2014-11-02 LAB — LIPID PANEL
CHOLESTEROL TOTAL: 154 mg/dL (ref 100–199)
Chol/HDL Ratio: 3.3 ratio units (ref 0.0–4.4)
HDL: 46 mg/dL (ref >39)
LDL Calculated: 76 mg/dL (ref 0–99)
TRIGLYCERIDES: 162 mg/dL — AB (ref 0–149)
VLDL CHOLESTEROL CAL: 32 mg/dL (ref 5–40)

## 2014-11-02 LAB — HEPATIC FUNCTION PANEL
ALBUMIN: 4.8 g/dL — AB (ref 3.5–4.7)
ALK PHOS: 99 IU/L (ref 39–117)
ALT: 19 IU/L (ref 0–32)
AST: 17 IU/L (ref 0–40)
BILIRUBIN TOTAL: 0.5 mg/dL (ref 0.0–1.2)
Bilirubin, Direct: 0.14 mg/dL (ref 0.00–0.40)
Total Protein: 7 g/dL (ref 6.0–8.5)

## 2014-11-02 LAB — MICROALBUMIN, URINE: MICROALBUM., U, RANDOM: 10.6 ug/mL (ref 0.0–17.0)

## 2014-11-07 ENCOUNTER — Encounter: Payer: Self-pay | Admitting: Family Medicine

## 2014-11-07 ENCOUNTER — Ambulatory Visit (INDEPENDENT_AMBULATORY_CARE_PROVIDER_SITE_OTHER): Payer: Medicare Other | Admitting: Family Medicine

## 2014-11-07 VITALS — BP 142/70 | Ht 68.0 in | Wt 175.0 lb

## 2014-11-07 DIAGNOSIS — E7800 Pure hypercholesterolemia, unspecified: Secondary | ICD-10-CM

## 2014-11-07 DIAGNOSIS — D510 Vitamin B12 deficiency anemia due to intrinsic factor deficiency: Secondary | ICD-10-CM

## 2014-11-07 DIAGNOSIS — E038 Other specified hypothyroidism: Secondary | ICD-10-CM | POA: Diagnosis not present

## 2014-11-07 DIAGNOSIS — D508 Other iron deficiency anemias: Secondary | ICD-10-CM

## 2014-11-07 DIAGNOSIS — I119 Hypertensive heart disease without heart failure: Secondary | ICD-10-CM

## 2014-11-07 DIAGNOSIS — E119 Type 2 diabetes mellitus without complications: Secondary | ICD-10-CM | POA: Diagnosis not present

## 2014-11-07 DIAGNOSIS — M8580 Other specified disorders of bone density and structure, unspecified site: Secondary | ICD-10-CM

## 2014-11-07 NOTE — Progress Notes (Signed)
   Subjective:    Patient ID: [name], female    DOB: [birth year], 81 y.o.   MRN: [id number]  Diabetes She presents for her follow-up diabetic visit. She has type 2 diabetes mellitus. Pertinent negatives for hypoglycemia include no confusion. Pertinent negatives for diabetes include no chest pain, no fatigue, no polydipsia, no polyphagia and no weakness. Current diabetic treatments: metformin.   A1C on 11/01/14 was 6.9. Patient denies any chest tightness pressure pain shortness breath denies any PND. She does relate a lot of fatigue and tiredness occurs more so in the afternoon. Stays active in the morning. Runs out of energy afternoon. She does her own housekeeping her own cooking although she's recently started Meals on Wheels in addition to this she does her own driving and denies any problem. She recently had lab work we're reviewing all of that today.  Review of Systems  Constitutional: Negative for activity change, appetite change and fatigue.  HENT: Negative for congestion.   Respiratory: Negative for cough.   Cardiovascular: Negative for chest pain.  Gastrointestinal: Negative for abdominal pain.  Endocrine: Negative for polydipsia and polyphagia.  Neurological: Negative for weakness.  Psychiatric/Behavioral: Negative for confusion.       Objective:   Physical Exam  Constitutional: She appears well-nourished. No distress.  Cardiovascular: Normal rate, regular rhythm and normal heart sounds.   No murmur heard. Pulmonary/Chest: Effort normal and breath sounds normal. No respiratory distress.  Musculoskeletal: She exhibits no edema.  Lymphadenopathy:    She has no cervical adenopathy.  Neurological: She is alert. She exhibits normal muscle tone.  Psychiatric: Her behavior is normal.  Vitals reviewed.  Pt  Will set up Mamogram-hx of cyst       Assessment & Plan:  1. Hypercholesterolemia Patient's cholesterol doesn't look bad she does need to stay with a heart healthy  diet stay physically active.  2. Type 2 diabetes mellitus without complication Her Z1I is 6.9 the importance of regular diet and activity discussed. If he goes up further on next draw we will need to add a medication possibly Lantus at nighttime possibly another oral medicine.  3. Osteopenia Patient will be due a bone density test in 2017  4. Benign hypertensive heart disease without heart failure The patient is having some fatigue and tiredness I believe this is age-related. I believe her blood pressure overall is good recheck it laying sitting standing look good lungs sounded clear I would not recommend further testing currently  5. Other iron deficiency anemias Patient complains of fatigue and tiredness she worries about anemia is had this in the past week check  6. Other specified hypothyroidism She states at one time she was on Synthroid and hasn't been on it for years and wonders if she needs have checked because of fatigue and tiredness - TSH  7. Pernicious anemia Patient relates that one time her B12 was all she has not had it checked in years and she has had a lot of fatigue and tiredness - CBC with Differential/Platelet - Vitamin B12  Patient denies being depressed does relate that she finds himself feeling lonely times has a hard time getting through the week because of lack of interaction with others. She's looking into the possibility of assisted living.

## 2014-11-08 ENCOUNTER — Encounter: Payer: Self-pay | Admitting: Family Medicine

## 2014-11-08 LAB — CBC WITH DIFFERENTIAL/PLATELET
BASOS: 0 %
Basophils Absolute: 0 10*3/uL (ref 0.0–0.2)
EOS (ABSOLUTE): 0.3 10*3/uL (ref 0.0–0.4)
Eos: 3 %
HEMATOCRIT: 36.8 % (ref 34.0–46.6)
Hemoglobin: 12.4 g/dL (ref 11.1–15.9)
IMMATURE GRANS (ABS): 0 10*3/uL (ref 0.0–0.1)
IMMATURE GRANULOCYTES: 0 %
Lymphocytes Absolute: 1.8 10*3/uL (ref 0.7–3.1)
Lymphs: 23 %
MCH: 29.5 pg (ref 26.6–33.0)
MCHC: 33.7 g/dL (ref 31.5–35.7)
MCV: 88 fL (ref 79–97)
Monocytes Absolute: 0.6 10*3/uL (ref 0.1–0.9)
Monocytes: 8 %
NEUTROS ABS: 5.3 10*3/uL (ref 1.4–7.0)
Neutrophils: 66 %
Platelets: 316 10*3/uL (ref 150–379)
RBC: 4.2 x10E6/uL (ref 3.77–5.28)
RDW: 13.8 % (ref 12.3–15.4)
WBC: 8 10*3/uL (ref 3.4–10.8)

## 2014-11-08 LAB — TSH: TSH: 2.2 u[IU]/mL (ref 0.450–4.500)

## 2014-11-08 LAB — VITAMIN B12: VITAMIN B 12: 75 pg/mL — AB (ref 211–946)

## 2014-11-09 ENCOUNTER — Other Ambulatory Visit: Payer: Self-pay | Admitting: Family Medicine

## 2014-11-09 DIAGNOSIS — R928 Other abnormal and inconclusive findings on diagnostic imaging of breast: Secondary | ICD-10-CM

## 2014-11-11 ENCOUNTER — Ambulatory Visit (INDEPENDENT_AMBULATORY_CARE_PROVIDER_SITE_OTHER): Payer: Medicare Other | Admitting: *Deleted

## 2014-11-11 DIAGNOSIS — E538 Deficiency of other specified B group vitamins: Secondary | ICD-10-CM | POA: Diagnosis not present

## 2014-11-11 MED ORDER — CYANOCOBALAMIN 1000 MCG/ML IJ SOLN
1000.0000 ug | Freq: Once | INTRAMUSCULAR | Status: AC
  Administered 2014-11-11: 1000 ug via INTRAMUSCULAR

## 2014-11-16 DIAGNOSIS — M1711 Unilateral primary osteoarthritis, right knee: Secondary | ICD-10-CM | POA: Diagnosis not present

## 2014-11-18 ENCOUNTER — Ambulatory Visit (INDEPENDENT_AMBULATORY_CARE_PROVIDER_SITE_OTHER): Payer: Medicare Other | Admitting: *Deleted

## 2014-11-18 DIAGNOSIS — D649 Anemia, unspecified: Secondary | ICD-10-CM | POA: Diagnosis not present

## 2014-11-18 MED ORDER — CYANOCOBALAMIN 1000 MCG/ML IJ SOLN
1000.0000 ug | Freq: Once | INTRAMUSCULAR | Status: AC
  Administered 2014-11-18: 1000 ug via INTRAMUSCULAR

## 2014-11-23 DIAGNOSIS — M1711 Unilateral primary osteoarthritis, right knee: Secondary | ICD-10-CM | POA: Diagnosis not present

## 2014-11-25 ENCOUNTER — Ambulatory Visit (INDEPENDENT_AMBULATORY_CARE_PROVIDER_SITE_OTHER): Payer: Medicare Other

## 2014-11-25 DIAGNOSIS — D510 Vitamin B12 deficiency anemia due to intrinsic factor deficiency: Secondary | ICD-10-CM

## 2014-11-25 MED ORDER — CYANOCOBALAMIN 1000 MCG/ML IJ SOLN
1000.0000 ug | Freq: Once | INTRAMUSCULAR | Status: AC
  Administered 2014-11-25: 1000 ug via INTRAMUSCULAR

## 2014-11-29 ENCOUNTER — Other Ambulatory Visit: Payer: Self-pay | Admitting: Family Medicine

## 2014-11-29 ENCOUNTER — Ambulatory Visit (HOSPITAL_COMMUNITY)
Admission: RE | Admit: 2014-11-29 | Discharge: 2014-11-29 | Disposition: A | Discharge status: Home / Self Care | Payer: Medicare Other | Source: Ambulatory Visit | Attending: Family Medicine | Admitting: Family Medicine

## 2014-11-29 DIAGNOSIS — R928 Other abnormal and inconclusive findings on diagnostic imaging of breast: Secondary | ICD-10-CM

## 2014-12-02 ENCOUNTER — Ambulatory Visit (INDEPENDENT_AMBULATORY_CARE_PROVIDER_SITE_OTHER): Payer: Medicare Other | Admitting: Nurse Practitioner

## 2014-12-02 DIAGNOSIS — M1711 Unilateral primary osteoarthritis, right knee: Secondary | ICD-10-CM | POA: Diagnosis not present

## 2014-12-02 DIAGNOSIS — D510 Vitamin B12 deficiency anemia due to intrinsic factor deficiency: Secondary | ICD-10-CM

## 2014-12-02 MED ORDER — CYANOCOBALAMIN 1000 MCG/ML IJ SOLN
1000.0000 ug | Freq: Once | INTRAMUSCULAR | Status: AC
  Administered 2014-12-02: 1000 ug via INTRAMUSCULAR

## 2014-12-02 NOTE — Progress Notes (Signed)
   Subjective:    Patient ID: [name], female    DOB: [birth year], 81 y.o.   MRN: [id number]  HPI  Vitamin B12 injection given IM in Right deltoid per doctors orders.   Review of Systems     Objective:   Physical Exam        Assessment & Plan:

## 2014-12-09 ENCOUNTER — Ambulatory Visit (INDEPENDENT_AMBULATORY_CARE_PROVIDER_SITE_OTHER): Payer: Medicare Other | Admitting: Family Medicine

## 2014-12-09 ENCOUNTER — Encounter: Payer: Self-pay | Admitting: Family Medicine

## 2014-12-09 VITALS — BP 140/70 | Ht 68.0 in | Wt 174.4 lb

## 2014-12-09 DIAGNOSIS — G629 Polyneuropathy, unspecified: Secondary | ICD-10-CM

## 2014-12-09 DIAGNOSIS — E118 Type 2 diabetes mellitus with unspecified complications: Secondary | ICD-10-CM

## 2014-12-09 DIAGNOSIS — D510 Vitamin B12 deficiency anemia due to intrinsic factor deficiency: Secondary | ICD-10-CM

## 2014-12-09 DIAGNOSIS — M792 Neuralgia and neuritis, unspecified: Secondary | ICD-10-CM

## 2014-12-09 MED ORDER — GABAPENTIN 100 MG PO CAPS: 100.0000 mg | ORAL_CAPSULE | Freq: Three times a day (TID) | ORAL | Status: DC

## 2014-12-09 NOTE — Progress Notes (Signed)
   Subjective:    Patient ID: [name], female    DOB: [birth year], 81 y.o.   MRN: [id number]  HPI Patient is here today for a follow up visit on her B12. Patient states that he energy level has improved slightly. Patient has completed 4 weeks of B12 injections in the office.    Patient states that she has been experiencing burning in her feet for several weeks now. Patient would like to talk to the doctor about this today.     Review of Systems  Constitutional: Negative for activity change, appetite change and fatigue.  HENT: Negative for congestion.   Respiratory: Negative for cough.   Cardiovascular: Negative for chest pain.  Gastrointestinal: Negative for abdominal pain.  Endocrine: Negative for polydipsia and polyphagia.  Neurological: Negative for weakness.  Psychiatric/Behavioral: Negative for confusion.       Objective:   Physical Exam  Bilateral foot exam completed pulses are good patient sensation is good. No ulcer seen bunions are noted. Subjective burning in      Assessment & Plan:  1. Pernicious anemia She is finishing B12 shots she will do 2000 g vitamin B12 orally daily. - Vitamin B12 she repeat the B12 in August possibly will have to go on shots if this is low  2. Type 2 diabetes mellitus with complication She will check an A1c in early August. The goal is to get the A1c 6.5 or less - Hemoglobin A1c  3. Peripheral neuropathic pain 2 tried Neurontin 100 mg titrate up gradually 100 mg 3 times daily

## 2014-12-12 ENCOUNTER — Telehealth: Payer: Self-pay | Admitting: Family Medicine

## 2014-12-12 NOTE — Telephone Encounter (Signed)
Notified patient stop medication, take some Robitussin DM and resume the medication in 1 week. Patient verbalized understanding.

## 2014-12-12 NOTE — Telephone Encounter (Signed)
Patient was prescribed gabapentin on 12/09/2014 and it is causing her to have a congestive cough at night after she takes the pill.  She said that she will cough all night and its affecting her sleep. Please advise.

## 2014-12-12 NOTE — Telephone Encounter (Signed)
[facility]- Per Dr. Richardson Landry, Stop medication, Take some Robitussin DM and resume the medication in 1 week.

## 2014-12-15 ENCOUNTER — Encounter (HOSPITAL_COMMUNITY): Payer: Self-pay | Admitting: Emergency Medicine

## 2014-12-15 ENCOUNTER — Inpatient Hospital Stay (HOSPITAL_COMMUNITY)
Admission: EM | Admit: 2014-12-15 | Discharge: 2014-12-17 | DRG: 872 | Disposition: A | Discharge status: Home / Self Care | Payer: Medicare Other | Attending: Internal Medicine | Admitting: Internal Medicine

## 2014-12-15 ENCOUNTER — Emergency Department (HOSPITAL_COMMUNITY): Payer: Medicare Other

## 2014-12-15 DIAGNOSIS — Z8041 Family history of malignant neoplasm of ovary: Secondary | ICD-10-CM | POA: Diagnosis not present

## 2014-12-15 DIAGNOSIS — Z9049 Acquired absence of other specified parts of digestive tract: Secondary | ICD-10-CM | POA: Diagnosis present

## 2014-12-15 DIAGNOSIS — F419 Anxiety disorder, unspecified: Secondary | ICD-10-CM

## 2014-12-15 DIAGNOSIS — E785 Hyperlipidemia, unspecified: Secondary | ICD-10-CM | POA: Diagnosis not present

## 2014-12-15 DIAGNOSIS — I10 Essential (primary) hypertension: Secondary | ICD-10-CM | POA: Diagnosis present

## 2014-12-15 DIAGNOSIS — D72829 Elevated white blood cell count, unspecified: Secondary | ICD-10-CM | POA: Diagnosis present

## 2014-12-15 DIAGNOSIS — N390 Urinary tract infection, site not specified: Secondary | ICD-10-CM | POA: Diagnosis present

## 2014-12-15 DIAGNOSIS — R188 Other ascites: Secondary | ICD-10-CM | POA: Diagnosis not present

## 2014-12-15 DIAGNOSIS — A419 Sepsis, unspecified organism: Secondary | ICD-10-CM | POA: Diagnosis not present

## 2014-12-15 DIAGNOSIS — Z79899 Other long term (current) drug therapy: Secondary | ICD-10-CM

## 2014-12-15 DIAGNOSIS — E119 Type 2 diabetes mellitus without complications: Secondary | ICD-10-CM | POA: Diagnosis not present

## 2014-12-15 DIAGNOSIS — I700 Atherosclerosis of aorta: Secondary | ICD-10-CM

## 2014-12-15 DIAGNOSIS — R112 Nausea with vomiting, unspecified: Secondary | ICD-10-CM | POA: Diagnosis present

## 2014-12-15 DIAGNOSIS — N179 Acute kidney failure, unspecified: Secondary | ICD-10-CM | POA: Diagnosis present

## 2014-12-15 DIAGNOSIS — Z90710 Acquired absence of both cervix and uterus: Secondary | ICD-10-CM | POA: Diagnosis not present

## 2014-12-15 DIAGNOSIS — Z833 Family history of diabetes mellitus: Secondary | ICD-10-CM

## 2014-12-15 DIAGNOSIS — Z7982 Long term (current) use of aspirin: Secondary | ICD-10-CM | POA: Diagnosis not present

## 2014-12-15 DIAGNOSIS — Z823 Family history of stroke: Secondary | ICD-10-CM | POA: Diagnosis not present

## 2014-12-15 DIAGNOSIS — R140 Abdominal distension (gaseous): Secondary | ICD-10-CM | POA: Diagnosis not present

## 2014-12-15 DIAGNOSIS — K8020 Calculus of gallbladder without cholecystitis without obstruction: Secondary | ICD-10-CM | POA: Diagnosis not present

## 2014-12-15 DIAGNOSIS — Z8249 Family history of ischemic heart disease and other diseases of the circulatory system: Secondary | ICD-10-CM | POA: Diagnosis not present

## 2014-12-15 DIAGNOSIS — E1151 Type 2 diabetes mellitus with diabetic peripheral angiopathy without gangrene: Secondary | ICD-10-CM

## 2014-12-15 LAB — CBC WITH DIFFERENTIAL/PLATELET
Basophils Absolute: 0 10*3/uL (ref 0.0–0.1)
Basophils Relative: 0 % (ref 0–1)
Eosinophils Absolute: 0.1 10*3/uL (ref 0.0–0.7)
Eosinophils Relative: 1 % (ref 0–5)
HCT: 42 % (ref 36.0–46.0)
Hemoglobin: 14.6 g/dL (ref 12.0–15.0)
LYMPHS ABS: 1.6 10*3/uL (ref 0.7–4.0)
LYMPHS PCT: 9 % — AB (ref 12–46)
MCH: 31.5 pg (ref 26.0–34.0)
MCHC: 34.8 g/dL (ref 30.0–36.0)
MCV: 90.7 fL (ref 78.0–100.0)
Monocytes Absolute: 0.7 10*3/uL (ref 0.1–1.0)
Monocytes Relative: 4 % (ref 3–12)
NEUTROS ABS: 14.6 10*3/uL — AB (ref 1.7–7.7)
NEUTROS PCT: 86 % — AB (ref 43–77)
Platelets: 367 10*3/uL (ref 150–400)
RBC: 4.63 MIL/uL (ref 3.87–5.11)
RDW: 13.1 % (ref 11.5–15.5)
WBC: 17.1 10*3/uL — AB (ref 4.0–10.5)

## 2014-12-15 LAB — URINALYSIS, ROUTINE W REFLEX MICROSCOPIC
Bilirubin Urine: NEGATIVE
GLUCOSE, UA: NEGATIVE mg/dL
Hgb urine dipstick: NEGATIVE
Ketones, ur: NEGATIVE mg/dL
Nitrite: POSITIVE — AB
PH: 5.5 (ref 5.0–8.0)
PROTEIN: NEGATIVE mg/dL
Specific Gravity, Urine: 1.016 (ref 1.005–1.030)
Urobilinogen, UA: 0.2 mg/dL (ref 0.0–1.0)

## 2014-12-15 LAB — COMPREHENSIVE METABOLIC PANEL
ALT: 18 U/L (ref 14–54)
ANION GAP: 11 (ref 5–15)
AST: 25 U/L (ref 15–41)
Albumin: 4.9 g/dL (ref 3.5–5.0)
Alkaline Phosphatase: 93 U/L (ref 38–126)
BILIRUBIN TOTAL: 0.8 mg/dL (ref 0.3–1.2)
BUN: 34 mg/dL — ABNORMAL HIGH (ref 6–20)
CHLORIDE: 102 mmol/L (ref 101–111)
CO2: 25 mmol/L (ref 22–32)
Calcium: 10.5 mg/dL — ABNORMAL HIGH (ref 8.9–10.3)
Creatinine, Ser: 1.07 mg/dL — ABNORMAL HIGH (ref 0.44–1.00)
GFR calc Af Amer: 55 mL/min — ABNORMAL LOW (ref >60)
GFR, EST NON AFRICAN AMERICAN: 47 mL/min — AB (ref >60)
Glucose, Bld: 148 mg/dL — ABNORMAL HIGH (ref 65–99)
POTASSIUM: 4.3 mmol/L (ref 3.5–5.1)
Sodium: 138 mmol/L (ref 135–145)
Total Protein: 8.3 g/dL — ABNORMAL HIGH (ref 6.5–8.1)

## 2014-12-15 LAB — URINE MICROSCOPIC-ADD ON

## 2014-12-15 LAB — I-STAT TROPONIN, ED: Troponin i, poc: 0.02 ng/mL (ref 0.00–0.08)

## 2014-12-15 LAB — LIPASE, BLOOD: LIPASE: 20 U/L — AB (ref 22–51)

## 2014-12-15 MED ORDER — INSULIN ASPART 100 UNIT/ML ~~LOC~~ SOLN
0.0000 [IU] | Freq: Three times a day (TID) | SUBCUTANEOUS | Status: DC
  Administered 2014-12-16: 1 [IU] via SUBCUTANEOUS

## 2014-12-15 MED ORDER — AMLODIPINE BESYLATE 10 MG PO TABS
10.0000 mg | ORAL_TABLET | Freq: Every day | ORAL | Status: DC
  Administered 2014-12-16 – 2014-12-17 (×2): 10 mg via ORAL
  Filled 2014-12-15 (×2): qty 1

## 2014-12-15 MED ORDER — CEFTRIAXONE SODIUM IN DEXTROSE 20 MG/ML IV SOLN
1.0000 g | INTRAVENOUS | Status: DC
  Administered 2014-12-16: 1 g via INTRAVENOUS
  Filled 2014-12-15 (×2): qty 50

## 2014-12-15 MED ORDER — SODIUM CHLORIDE 0.9 % IV SOLN
Freq: Once | INTRAVENOUS | Status: AC
  Administered 2014-12-15: 19:00:00 via INTRAVENOUS

## 2014-12-15 MED ORDER — HEPARIN SODIUM (PORCINE) 5000 UNIT/ML IJ SOLN
5000.0000 [IU] | Freq: Three times a day (TID) | INTRAMUSCULAR | Status: DC
  Administered 2014-12-15 – 2014-12-17 (×5): 5000 [IU] via SUBCUTANEOUS
  Filled 2014-12-15 (×8): qty 1

## 2014-12-15 MED ORDER — ONDANSETRON HCL 4 MG/2ML IJ SOLN
4.0000 mg | Freq: Once | INTRAMUSCULAR | Status: AC
  Administered 2014-12-15: 4 mg via INTRAVENOUS
  Filled 2014-12-15: qty 2

## 2014-12-15 MED ORDER — SODIUM CHLORIDE 0.9 % IV BOLUS (SEPSIS)
1000.0000 mL | Freq: Once | INTRAVENOUS | Status: AC
  Administered 2014-12-15: 1000 mL via INTRAVENOUS

## 2014-12-15 MED ORDER — SODIUM CHLORIDE 0.9 % IV SOLN
INTRAVENOUS | Status: DC
  Administered 2014-12-15 (×2): via INTRAVENOUS

## 2014-12-15 MED ORDER — ACETAMINOPHEN 325 MG PO TABS: 650.0000 mg | ORAL_TABLET | Freq: Four times a day (QID) | ORAL | Status: DC | PRN

## 2014-12-15 MED ORDER — IOHEXOL 300 MG/ML  SOLN
100.0000 mL | Freq: Once | INTRAMUSCULAR | Status: AC | PRN
  Administered 2014-12-15: 100 mL via INTRAVENOUS

## 2014-12-15 MED ORDER — ADULT MULTIVITAMIN W/MINERALS CH
1.0000 | ORAL_TABLET | Freq: Every day | ORAL | Status: DC
  Administered 2014-12-16 – 2014-12-17 (×2): 1 via ORAL
  Filled 2014-12-15 (×2): qty 1

## 2014-12-15 MED ORDER — SODIUM CHLORIDE 0.9 % IJ SOLN
3.0000 mL | Freq: Two times a day (BID) | INTRAMUSCULAR | Status: DC
  Administered 2014-12-16: 3 mL via INTRAVENOUS

## 2014-12-15 MED ORDER — ALPRAZOLAM 0.25 MG PO TABS: 0.2500 mg | ORAL_TABLET | Freq: Every evening | ORAL | Status: DC | PRN

## 2014-12-15 MED ORDER — ACETAMINOPHEN 650 MG RE SUPP: 650.0000 mg | Freq: Four times a day (QID) | RECTAL | Status: DC | PRN

## 2014-12-15 MED ORDER — ALUM & MAG HYDROXIDE-SIMETH 200-200-20 MG/5ML PO SUSP: 30.0000 mL | Freq: Four times a day (QID) | ORAL | Status: DC | PRN

## 2014-12-15 MED ORDER — HYDRALAZINE HCL 20 MG/ML IJ SOLN: 5.0000 mg | INTRAMUSCULAR | Status: DC | PRN

## 2014-12-15 MED ORDER — HYDROMORPHONE HCL 1 MG/ML IJ SOLN
0.5000 mg | Freq: Once | INTRAMUSCULAR | Status: AC
  Administered 2014-12-15: 0.5 mg via INTRAVENOUS
  Filled 2014-12-15: qty 1

## 2014-12-15 MED ORDER — MORPHINE SULFATE 2 MG/ML IJ SOLN: 2.0000 mg | INTRAMUSCULAR | Status: DC | PRN

## 2014-12-15 MED ORDER — OCUVITE PO TABS
1.0000 | ORAL_TABLET | Freq: Every day | ORAL | Status: DC
  Administered 2014-12-16 – 2014-12-17 (×2): 1 via ORAL
  Filled 2014-12-15 (×2): qty 1

## 2014-12-15 MED ORDER — POLYVINYL ALCOHOL 1.4 % OP SOLN
1.0000 [drp] | Freq: Two times a day (BID) | OPHTHALMIC | Status: DC
  Administered 2014-12-15 – 2014-12-17 (×4): 1 [drp] via OPHTHALMIC
  Filled 2014-12-15: qty 15

## 2014-12-15 MED ORDER — SODIUM CHLORIDE 0.9 % IV SOLN
INTRAVENOUS | Status: DC
  Administered 2014-12-15 – 2014-12-16 (×2): via INTRAVENOUS

## 2014-12-15 MED ORDER — VITAMIN B-12 1000 MCG PO TABS
2000.0000 ug | ORAL_TABLET | Freq: Every day | ORAL | Status: DC
  Administered 2014-12-16 – 2014-12-17 (×2): 2000 ug via ORAL
  Filled 2014-12-15 (×2): qty 2

## 2014-12-15 MED ORDER — GEMFIBROZIL 600 MG PO TABS
600.0000 mg | ORAL_TABLET | Freq: Every day | ORAL | Status: DC
  Administered 2014-12-16 – 2014-12-17 (×2): 600 mg via ORAL
  Filled 2014-12-15 (×3): qty 1

## 2014-12-15 MED ORDER — GABAPENTIN 100 MG PO CAPS
100.0000 mg | ORAL_CAPSULE | Freq: Three times a day (TID) | ORAL | Status: DC | PRN
  Filled 2014-12-15: qty 1

## 2014-12-15 MED ORDER — ONDANSETRON HCL 4 MG/2ML IJ SOLN: 4.0000 mg | Freq: Three times a day (TID) | INTRAMUSCULAR | Status: DC | PRN

## 2014-12-15 MED ORDER — RED YEAST RICE 600 MG PO CAPS: 1.0000 | ORAL_CAPSULE | Freq: Two times a day (BID) | ORAL | Status: DC

## 2014-12-15 MED ORDER — ASPIRIN 81 MG PO CHEW
81.0000 mg | CHEWABLE_TABLET | Freq: Every day | ORAL | Status: DC
  Administered 2014-12-16 – 2014-12-17 (×2): 81 mg via ORAL
  Filled 2014-12-15 (×2): qty 1

## 2014-12-15 MED ORDER — DEXTROSE 5 % IV SOLN
1.0000 g | Freq: Once | INTRAVENOUS | Status: AC
  Administered 2014-12-15: 1 g via INTRAVENOUS
  Filled 2014-12-15: qty 10

## 2014-12-15 NOTE — ED Provider Notes (Signed)
CSN: [id number]     Arrival date & time 12/15/14  1622 History   First MD Initiated Contact with Patient 12/15/14 1809     Chief Complaint  Patient presents with  . Abdominal Pain  . Nausea  . Emesis     (Consider location/radiation/quality/duration/timing/severity/associated sxs/prior Treatment) Patient is a 81 y.o. female presenting with abdominal pain and vomiting. The history is provided by the patient.  Abdominal Pain Pain location:  Generalized Pain quality: cramping   Pain radiates to:  Does not radiate Pain severity:  Moderate Onset quality:  Gradual Duration:  2 days Timing:  Constant Progression:  Worsening Chronicity:  New Context comment:  Spontaneously Relieved by:  Nothing Worsened by:  Nothing tried Ineffective treatments:  None tried Associated symptoms: diarrhea, nausea and vomiting   Associated symptoms: no chest pain, no cough, no dysuria, no fatigue, no fever, no hematuria and no shortness of breath   Emesis Associated symptoms: abdominal pain and diarrhea   Associated symptoms: no headaches     Past Medical History  Diagnosis Date  . Hypertension   . Diabetes mellitus   . Hyperlipidemia   . Osteoarthritis   . History of hysterectomy   . Diverticulitis     multilple surgeries  . Hypertension 02/13/2011  . Gallstones    Past Surgical History  Procedure Laterality Date  . Appendectomy    . Breast reduction surgery    . Knee surgery    . Refractive surgery    . Cardiac catheterization  06/01/2003    EF is between 60-65% -- Smooth and normal coronary arteries -- Normal left ventricular systolic function  -- We will continue with medical therapy for her hypertensio  . Laparoscopic incisional / umbilical / ventral hernia repair  02/23/2007   . Colon surgery      left  . Abdominal hysterectomy    . Eye surgery  03/30/2009    cataract   Family History  Problem Relation Age of Onset  . Cancer Mother     ovaian  . Hypertension Father      kidney  . Stroke Father   . Cancer Father   . Diabetes Brother   . Hypertension Sister    History  Substance Use Topics  . Smoking status: Never Smoker   . Smokeless tobacco: Never Used  . Alcohol Use: No   OB History    No data available     Review of Systems  Constitutional: Negative for fever and fatigue.  HENT: Negative for congestion and drooling.   Eyes: Negative for pain.  Respiratory: Negative for cough and shortness of breath.   Cardiovascular: Negative for chest pain.  Gastrointestinal: Positive for nausea, vomiting, abdominal pain and diarrhea.  Genitourinary: Negative for dysuria and hematuria.  Musculoskeletal: Negative for back pain, gait problem and neck pain.  Skin: Negative for color change.  Neurological: Negative for dizziness and headaches.  Hematological: Negative for adenopathy.  Psychiatric/Behavioral: Negative for behavioral problems.  All other systems reviewed and are negative.     Allergies  Phenergan; Prednisone; and Statins  Home Medications   Prior to Admission medications   Medication Sig Start Date End Date Taking? Authorizing Provider  ALPRAZolam (XANAX) 0.25 MG tablet TAKE ONE TABLET BY MOUTH AT BEDTIME Patient taking differently: TAKE ONE TABLET BY MOUTH AT BEDTIME as needed for sleep 08/01/14  Yes Kathyrn Drown, MD  amLODipine (NORVASC) 10 MG tablet TAKE ONE TABLET BY MOUTH ONCE DAILY 03/28/14  Yes Scott A [place],  MD  aspirin 81 MG tablet Take 81 mg by mouth daily.     Yes Historical Provider, MD  beta carotene w/minerals (OCUVITE) tablet Take 1 tablet by mouth daily.   Yes Historical Provider, MD  cyanocobalamin 2000 MCG tablet Take 2,000 mcg by mouth daily.   Yes Historical Provider, MD  gabapentin (NEURONTIN) 100 MG capsule Take 1 capsule (100 mg total) by mouth 3 (three) times daily. Patient taking differently: Take 100 mg by mouth 3 (three) times daily as needed (pain).  12/09/14  Yes Kathyrn Drown, MD  gemfibrozil (LOPID) 600 MG  tablet Take 1 tablet (600 mg total) by mouth daily. 03/28/14  Yes Kathyrn Drown, MD  losartan-hydrochlorothiazide (HYZAAR) 100-25 MG per tablet TAKE ONE TABLET BY MOUTH ONCE DAILY 03/28/14  Yes Kathyrn Drown, MD  metFORMIN (GLUCOPHAGE) 1000 MG tablet Take 1 tablet (1,000 mg total) by mouth 2 (two) times daily. 09/12/14  Yes Kathyrn Drown, MD  multivitamin [facility]) per tablet Take 1 tablet by mouth daily.     Yes Historical Provider, MD  polyvinyl alcohol (LIQUIFILM TEARS) 1.4 % ophthalmic solution Place 1 drop into both eyes 2 (two) times daily.   Yes Historical Provider, MD  Red Yeast Rice Extract (RED YEAST RICE PO) Take 1 capsule by mouth 2 (two) times daily.     Yes Historical Provider, MD   BP 140/56 mmHg  Pulse 96  Temp(Src) 98.2 F (36.8 C) (Oral)  Resp 18  SpO2 96% Physical Exam  Constitutional: She is oriented to person, place, and time. She appears well-developed and well-nourished.  HENT:  Head: Normocephalic.  Mouth/Throat: Oropharynx is clear and moist. No oropharyngeal exudate.  Eyes: Conjunctivae and EOM are normal. Pupils are equal, round, and reactive to light.  Neck: Normal range of motion. Neck supple.  Cardiovascular: Normal rate, regular rhythm, normal heart sounds and intact distal pulses.  Exam reveals no gallop and no friction rub.   No murmur heard. Pulmonary/Chest: Effort normal and breath sounds normal. No respiratory distress. She has no wheezes.  Abdominal: Soft. Bowel sounds are normal. She exhibits distension (mild). There is tenderness (diffuse mild ttp). There is no rebound and no guarding.  Musculoskeletal: Normal range of motion. She exhibits no edema or tenderness.  Neurological: She is alert and oriented to person, place, and time.  Skin: Skin is warm and dry.  Psychiatric: She has a normal mood and affect. Her behavior is normal.  Nursing note and vitals reviewed.   ED Course  Procedures (including critical care time) Labs Review Labs  Reviewed  CBC WITH DIFFERENTIAL/PLATELET - Abnormal; Notable for the following:    WBC 17.1 (*)    Neutrophils Relative % 86 (*)    Neutro Abs 14.6 (*)    Lymphocytes Relative 9 (*)    All other components within normal limits  COMPREHENSIVE METABOLIC PANEL - Abnormal; Notable for the following:    Glucose, Bld 148 (*)    BUN 34 (*)    Creatinine, Ser 1.07 (*)    Calcium 10.5 (*)    Total Protein 8.3 (*)    GFR calc non Af Amer 47 (*)    GFR calc Af Amer 55 (*)    All other components within normal limits  LIPASE, BLOOD - Abnormal; Notable for the following:    Lipase 20 (*)    All other components within normal limits  URINALYSIS, ROUTINE W REFLEX MICROSCOPIC (NOT AT [facility])  Randolm Idol, ED    Imaging  Review Ct Abdomen Pelvis W Contrast  12/15/2014   CLINICAL DATA:  Acute onset of nausea and vomiting. Abdominal cramping and pain. Initial encounter.  EXAM: CT ABDOMEN AND PELVIS WITH CONTRAST  TECHNIQUE: Multidetector CT imaging of the abdomen and pelvis was performed using the standard protocol following bolus administration of intravenous contrast.  CONTRAST:  175mL OMNIPAQUE IOHEXOL 300 MG/ML  SOLN  COMPARISON:  CT of the abdomen and pelvis performed 02/27/2006  FINDINGS: Mild bibasilar atelectasis or scarring is noted.  Trace fluid is noted tracking about the liver, and trace fluid is seen tracking about small bowel loops. A small amount of free fluid is seen within the pelvis.  The liver and spleen are unremarkable in appearance. Stones are noted dependently within the gallbladder. The gallbladder is otherwise unremarkable. The pancreas and adrenal glands are unremarkable.  The kidneys are unremarkable in appearance. There is no evidence of hydronephrosis. No renal or ureteral stones are seen. No perinephric stranding is appreciated.  No free fluid is identified. The small bowel is unremarkable in appearance. The stomach is within normal limits. No acute vascular abnormalities are  seen. Diffuse calcification is seen along the abdominal aorta and its branches.  A prominent anterior abdominal wall mesh is noted; it is unremarkable in appearance.  The patient is status post appendectomy. An apparent small diverticulum is noted at the hepatic flexure of the colon. A single diverticulum is suggested along the distal descending colon. The colon is largely filled with fluid and is unremarkable in appearance.  The bladder is mildly distended and grossly unremarkable. The patient is status post hysterectomy. No suspicious adnexal masses are seen. No inguinal lymphadenopathy is seen.  No acute osseous abnormalities are identified.  IMPRESSION: 1. No acute abnormality seen to explain the patient's symptoms. 2. Small amount of ascites noted within the abdomen and pelvis. This is of uncertain etiology. 3. Cholelithiasis; gallbladder otherwise unremarkable. 4. Diffuse calcification along the abdominal aorta and its branches. 5. Mild bibasilar atelectasis or scarring noted.   Electronically Signed   By: Garald Balding M.D.   On: 12/15/2014 20:21     EKG Interpretation None      MDM   Final diagnoses:  Abdominal distension  Nausea and vomiting, vomiting of unspecified type    7:03 PM 81 y.o. female with history of hypertension, diabetes who presents with abdominal cramping, nausea, vomiting which began 2 days ago. She denies any fevers or urinary symptoms. She did have a large loose stool when the symptoms initially started. History of multiple abdominal surgeries. Vital signs unremarkable here. We'll get screening labs and CT scan of abdomen.  Will tx for UTI and admit to hosptialist for symptomatic tx.   Pamella Pert, MD 12/15/14 2325

## 2014-12-15 NOTE — ED Notes (Signed)
Pt c/o nausea and vomiting since this morning.  She started vomiting at around 3pm this afternoon and has vomited three times since.  She states that she vomited what seemed like a lot of emesis.  She has felt hot flashes but is unsure of whether or not she has had a fever.  She denies chills, sweating, dizziness, diarrhea.  Abdominal pain is 8/10 cramping and is felt throughout abdominal area. No meds taken for symptoms.

## 2014-12-15 NOTE — H&P (Signed)
[facility] History and Physical  [name] HKV:[id number] DOB: 1932/05/28 DOA: 12/15/2014  Referring physician: ED physician PCP: Sallee Lange, MD  Specialists:   Chief Complaint: Increased urinary frequency, nausea, vomiting and abdominal pain  HPI: [name] is a 81 y.o. female with PMH of hypertension, hyperlipidemia, diabetes mellitus, anxiety, history of diverticulitis, osteoarthritis, who presents with increased urinary frequency, nausea, vomiting and abdominal pain.  Patient reports that in the past 2 days, she has been having nausea, vomiting and abdominal pain. She vomited 3 times without blood in the vomitus today. She does not have diarrhea. Her dominant pain is diffused. She denies dysuria or burning on urination, but has increased urinary frequency. No fever or chills. No chest pain, shortness of breath, coughing. No unilateral weakness.  In ED, patient was found to have WBC 17.1, negative troponin, positive urinalysis for UTI, lipase 20, tachycardia, electrolytes okay. CT abdomen/pelvis showed gallstone without cholecystitis, and is negative for acute abnormalities. Patient is admitted to inpatient for further evaluation and treatment.  Where does patient live?   At home    Can patient participate in ADLs?  Some   Review of Systems:   General: no fevers, chills, no changes in body weight, has poor appetite, has fatigue HEENT: no blurry vision, hearing changes or sore throat Pulm: no dyspnea, coughing, wheezing CV: no chest pain, palpitations Abd: has nausea, vomiting, abdominal pain, no diarrhea, constipation GU: no dysuria, burning on urination, has increased urinary frequency, no hematuria  Ext: no leg edema Neuro: no unilateral weakness, numbness, or tingling, no vision change or hearing loss Skin: no rash MSK: No muscle spasm, no deformity, no limitation of range of movement in spin Heme: No easy bruising.  Travel history: No recent long distant  travel.  Allergy:  Allergies  Allergen Reactions  . Phenergan [Promethazine Hcl]     hallucinations  . Prednisone     rash  . Statins     Severe myalgias    Past Medical History  Diagnosis Date  . Hypertension   . Diabetes mellitus   . Hyperlipidemia   . Osteoarthritis   . History of hysterectomy   . Diverticulitis     multilple surgeries  . Hypertension 02/13/2011  . Gallstones     Past Surgical History  Procedure Laterality Date  . Appendectomy    . Breast reduction surgery    . Knee surgery    . Refractive surgery    . Cardiac catheterization  06/01/2003    EF is between 60-65% -- Smooth and normal coronary arteries -- Normal left ventricular systolic function  -- We will continue with medical therapy for her hypertensio  . Laparoscopic incisional / umbilical / ventral hernia repair  02/23/2007   . Colon surgery      left  . Abdominal hysterectomy    . Eye surgery  03/30/2009    cataract    Social History:  reports that she has never smoked. She has never used smokeless tobacco. She reports that she does not drink alcohol or use illicit drugs.  Family History:  Family History  Problem Relation Age of Onset  . Cancer Mother     ovaian  . Hypertension Father     kidney  . Stroke Father   . Cancer Father   . Diabetes Brother   . Hypertension Sister      Prior to Admission medications   Medication Sig Start Date End Date Taking? Authorizing Provider  ALPRAZolam Duanne Moron) 0.25  MG tablet TAKE ONE TABLET BY MOUTH AT BEDTIME Patient taking differently: TAKE ONE TABLET BY MOUTH AT BEDTIME as needed for sleep 08/01/14  Yes Kathyrn Drown, MD  amLODipine (NORVASC) 10 MG tablet TAKE ONE TABLET BY MOUTH ONCE DAILY 03/28/14  Yes Kathyrn Drown, MD  aspirin 81 MG tablet Take 81 mg by mouth daily.     Yes Historical Provider, MD  beta carotene w/minerals (OCUVITE) tablet Take 1 tablet by mouth daily.   Yes Historical Provider, MD  cyanocobalamin 2000 MCG tablet Take 2,000  mcg by mouth daily.   Yes Historical Provider, MD  gabapentin (NEURONTIN) 100 MG capsule Take 1 capsule (100 mg total) by mouth 3 (three) times daily. Patient taking differently: Take 100 mg by mouth 3 (three) times daily as needed (pain).  12/09/14  Yes Kathyrn Drown, MD  gemfibrozil (LOPID) 600 MG tablet Take 1 tablet (600 mg total) by mouth daily. 03/28/14  Yes Kathyrn Drown, MD  losartan-hydrochlorothiazide (HYZAAR) 100-25 MG per tablet TAKE ONE TABLET BY MOUTH ONCE DAILY 03/28/14  Yes Kathyrn Drown, MD  metFORMIN (GLUCOPHAGE) 1000 MG tablet Take 1 tablet (1,000 mg total) by mouth 2 (two) times daily. 09/12/14  Yes Kathyrn Drown, MD  multivitamin [facility]) per tablet Take 1 tablet by mouth daily.     Yes Historical Provider, MD  polyvinyl alcohol (LIQUIFILM TEARS) 1.4 % ophthalmic solution Place 1 drop into both eyes 2 (two) times daily.   Yes Historical Provider, MD  Red Yeast Rice Extract (RED YEAST RICE PO) Take 1 capsule by mouth 2 (two) times daily.     Yes Historical Provider, MD    Physical Exam: Filed Vitals:   12/15/14 1634 12/15/14 1834  BP: 158/62 140/56  Pulse: 109 96  Temp: 98.2 F (36.8 C)   TempSrc: Oral   Resp: 20 18  SpO2: 97% 96%   General: Not in acute distress HEENT:       Eyes: PERRL, EOMI, no scleral icterus.       ENT: No discharge from the ears and nose, no pharynx injection, no tonsillar enlargement.        Neck: No JVD, no bruit, no mass felt. Heme: No neck lymph node enlargement. Cardiac: S1/S2, RRR, No murmurs, No gallops or rubs. Pulm: No rales, wheezing, rhonchi or rubs. Abd: Soft, mildly distended, diffused tenderness, no rebound pain, no organomegaly, BS present. No CVA tenderness Ext: No pitting leg edema bilaterally. 2+DP/PT pulse bilaterally. Musculoskeletal: No joint deformities, No joint redness or warmth, no limitation of ROM in spin. Skin: No rashes.  Neuro: Alert, oriented X3, cranial nerves II-XII grossly intact, muscle strength 5/5 in  all extremities, sensation to light touch intact.  Psych: Patient is not psychotic, no suicidal or hemocidal ideation.  Labs on Admission:  Basic Metabolic Panel:  Recent Labs Lab 12/15/14 1801  NA 138  K 4.3  CL 102  CO2 25  GLUCOSE 148*  BUN 34*  CREATININE 1.07*  CALCIUM 10.5*   Liver Function Tests:  Recent Labs Lab 12/15/14 1801  AST 25  ALT 18  ALKPHOS 93  BILITOT 0.8  PROT 8.3*  ALBUMIN 4.9    Recent Labs Lab 12/15/14 1801  LIPASE 20*   No results for input(s): AMMONIA in the last 168 hours. CBC:  Recent Labs Lab 12/15/14 1801  WBC 17.1*  NEUTROABS 14.6*  HGB 14.6  HCT 42.0  MCV 90.7  PLT 367   Cardiac Enzymes: No results for input(s):  CKTOTAL, CKMB, CKMBINDEX, TROPONINI in the last 168 hours.  BNP (last 3 results) No results for input(s): BNP in the last 8760 hours.  ProBNP (last 3 results)  Recent Labs  06/19/14 1213  PROBNP 109.9    CBG: No results for input(s): GLUCAP in the last 168 hours.  Radiological Exams on Admission: Ct Abdomen Pelvis W Contrast  12/15/2014   CLINICAL DATA:  Acute onset of nausea and vomiting. Abdominal cramping and pain. Initial encounter.  EXAM: CT ABDOMEN AND PELVIS WITH CONTRAST  TECHNIQUE: Multidetector CT imaging of the abdomen and pelvis was performed using the standard protocol following bolus administration of intravenous contrast.  CONTRAST:  138mL OMNIPAQUE IOHEXOL 300 MG/ML  SOLN  COMPARISON:  CT of the abdomen and pelvis performed 02/27/2006  FINDINGS: Mild bibasilar atelectasis or scarring is noted.  Trace fluid is noted tracking about the liver, and trace fluid is seen tracking about small bowel loops. A small amount of free fluid is seen within the pelvis.  The liver and spleen are unremarkable in appearance. Stones are noted dependently within the gallbladder. The gallbladder is otherwise unremarkable. The pancreas and adrenal glands are unremarkable.  The kidneys are unremarkable in appearance.  There is no evidence of hydronephrosis. No renal or ureteral stones are seen. No perinephric stranding is appreciated.  No free fluid is identified. The small bowel is unremarkable in appearance. The stomach is within normal limits. No acute vascular abnormalities are seen. Diffuse calcification is seen along the abdominal aorta and its branches.  A prominent anterior abdominal wall mesh is noted; it is unremarkable in appearance.  The patient is status post appendectomy. An apparent small diverticulum is noted at the hepatic flexure of the colon. A single diverticulum is suggested along the distal descending colon. The colon is largely filled with fluid and is unremarkable in appearance.  The bladder is mildly distended and grossly unremarkable. The patient is status post hysterectomy. No suspicious adnexal masses are seen. No inguinal lymphadenopathy is seen.  No acute osseous abnormalities are identified.  IMPRESSION: 1. No acute abnormality seen to explain the patient's symptoms. 2. Small amount of ascites noted within the abdomen and pelvis. This is of uncertain etiology. 3. Cholelithiasis; gallbladder otherwise unremarkable. 4. Diffuse calcification along the abdominal aorta and its branches. 5. Mild bibasilar atelectasis or scarring noted.   Electronically Signed   By: Garald Balding M.D.   On: 12/15/2014 20:21    EKG: Independently reviewed.  Abnormal findings: Q wave in V1 to V3 (patient had Q-wave in V1-V2 on old EKG on 03/16/13)..   Assessment/Plan Principal Problem:   Nausea and vomiting Active Problems:   Diabetes mellitus without complication   Osteopenia   HLD (hyperlipidemia)   HTN (hypertension)   Anxiety   UTI (lower urinary tract infection)   Sepsis  UTI and sepsis: Patient has increased urinary frequency and positive urinalysis for UTI. Though patient does not have CVA tenderness, she has nausea, vomiting and abdominal pain, indicating possible pyelonephritis. Patient is septic on  admission with leukocytosis and tachycardia. Hemodynamically stable.  -will admit to tele bed -will treat with IV Ceftriaxone -Will send for urine culture and narrow abx according to sensitivities -follow up blood culture -will get Procalcitonin and trend lactic acid levels per sepsis protocol. -IVF: 2L of NS bolus in ED, followed by 75 cc/h  Nausea, vomiting and abdominal pain: Likely due to sepsis and UTI. Lipase negative. CT abdomen/pelvis has no acute issues. -Symptomatic treatment: Morphine for pain, Zofran  for nausea  DM-II: Last A1c 6.9 on 11/01/14, well controled. Patient is taking Metforminat home -SSI  Anxiety: Stable, no suicidal or homicidal ideations. -Continue home medications: Xanax  HLD: Last LDL was 76  On 11/01/14 -Continue home medications: gemfibrozil  HTN: -hold Hyzarr since patient is at risk of developing hypotension -continue amlodipine -IV Hydralazine when necessary   DVT ppx: SQ Heparin       Code Status: Partial code (OK with CPR, but not intubation) Family Communication:   Yes, patient's  son     at bed side Disposition Plan: Admit to inpatient   Date of Service 12/15/2014    Ivor Costa [facility] Pager [phone]  If 7PM-7AM, please contact night-coverage [email or web address] Password [facility] 12/15/2014, 9:55 PM

## 2014-12-15 NOTE — ED Notes (Signed)
Pt c/o gen abd pain with NV since she woke up this morning.

## 2014-12-15 NOTE — Progress Notes (Signed)
PHARMACIST - PHYSICIAN ORDER COMMUNICATION  CONCERNING: P&T Medication Policy on Herbal Medications  DESCRIPTION:  This patient's order for:  RED YEAST RICE has been noted.  This product(s) is classified as an "herbal" or natural product. Due to a lack of definitive safety studies or FDA approval, nonstandard manufacturing practices, plus the potential risk of unknown drug-drug interactions while on inpatient medications, the Pharmacy and Therapeutics Committee does not permit the use of "herbal" or natural products of this type within [facility].   ACTION TAKEN: The pharmacy department is unable to verify this order at this time and your patient has been informed of this safety policy. Please reevaluate patient's clinical condition at discharge and address if the herbal or natural product(s) should be resumed at that time.  Leone Haven, PharmD

## 2014-12-16 ENCOUNTER — Encounter (HOSPITAL_COMMUNITY): Payer: Self-pay | Admitting: *Deleted

## 2014-12-16 DIAGNOSIS — D72829 Elevated white blood cell count, unspecified: Secondary | ICD-10-CM | POA: Diagnosis present

## 2014-12-16 DIAGNOSIS — N390 Urinary tract infection, site not specified: Secondary | ICD-10-CM

## 2014-12-16 DIAGNOSIS — E785 Hyperlipidemia, unspecified: Secondary | ICD-10-CM

## 2014-12-16 DIAGNOSIS — I10 Essential (primary) hypertension: Secondary | ICD-10-CM

## 2014-12-16 DIAGNOSIS — A419 Sepsis, unspecified organism: Principal | ICD-10-CM

## 2014-12-16 DIAGNOSIS — E119 Type 2 diabetes mellitus without complications: Secondary | ICD-10-CM

## 2014-12-16 LAB — CBC
HCT: 36.1 % (ref 36.0–46.0)
Hemoglobin: 11.9 g/dL — ABNORMAL LOW (ref 12.0–15.0)
MCH: 29.8 pg (ref 26.0–34.0)
MCHC: 33 g/dL (ref 30.0–36.0)
MCV: 90.3 fL (ref 78.0–100.0)
Platelets: 265 10*3/uL (ref 150–400)
RBC: 4 MIL/uL (ref 3.87–5.11)
RDW: 13.1 % (ref 11.5–15.5)
WBC: 12.4 10*3/uL — ABNORMAL HIGH (ref 4.0–10.5)

## 2014-12-16 LAB — GLUCOSE, CAPILLARY
GLUCOSE-CAPILLARY: 100 mg/dL — AB (ref 65–99)
GLUCOSE-CAPILLARY: 84 mg/dL (ref 65–99)
Glucose-Capillary: 127 mg/dL — ABNORMAL HIGH (ref 65–99)
Glucose-Capillary: 106 mg/dL — ABNORMAL HIGH (ref 65–99)

## 2014-12-16 LAB — PROTIME-INR
INR: 1.09 (ref 0.00–1.49)
PROTHROMBIN TIME: 14.3 s (ref 11.6–15.2)

## 2014-12-16 LAB — BASIC METABOLIC PANEL
Anion gap: 8 (ref 5–15)
BUN: 27 mg/dL — AB (ref 6–20)
CHLORIDE: 105 mmol/L (ref 101–111)
CO2: 26 mmol/L (ref 22–32)
Calcium: 8.3 mg/dL — ABNORMAL LOW (ref 8.9–10.3)
Creatinine, Ser: 0.9 mg/dL (ref 0.44–1.00)
GFR calc Af Amer: >60 mL/min (ref >60)
GFR calc non Af Amer: 58 mL/min — ABNORMAL LOW (ref >60)
GLUCOSE: 130 mg/dL — AB (ref 65–99)
POTASSIUM: 4.4 mmol/L (ref 3.5–5.1)
Sodium: 139 mmol/L (ref 135–145)

## 2014-12-16 LAB — PROCALCITONIN: Procalcitonin: <0.1 ng/mL

## 2014-12-16 LAB — CLOSTRIDIUM DIFFICILE BY PCR: Toxigenic C. Difficile by PCR: NEGATIVE

## 2014-12-16 LAB — MRSA PCR SCREENING: MRSA by PCR: NEGATIVE

## 2014-12-16 LAB — LACTIC ACID, PLASMA
Lactic Acid, Venous: 1.4 mmol/L (ref 0.5–2.0)
Lactic Acid, Venous: 0.9 mmol/L (ref 0.5–2.0)

## 2014-12-16 LAB — APTT: APTT: 32 s (ref 24–37)

## 2014-12-16 NOTE — Progress Notes (Signed)
Patient noted to have 3rd loose stool.  Per policy patient placed on enteric precautions.

## 2014-12-16 NOTE — Evaluation (Signed)
Physical Therapy Evaluation Patient Details Name: [name] MRN: [id number] DOB: [birth year] Today's Date: 12/16/2014   History of Present Illness  [name] is a 81 y.o. female with PMH of hypertension, hyperlipidemia, diabetes mellitus, anxiety, history of diverticulitis, osteoarthritis, who presents with increased urinary frequency, nausea, vomiting and abdominal pain.  Clinical Impression  Patient evaluated by Physical Therapy with no further acute PT needs identified. All education has been completed and the patient has no further questions.  See below for any follow-up Physial Therapy or equipment needs. PT is signing off. Thank you for this referral.     Follow Up Recommendations No PT follow up    Equipment Recommendations  None recommended by PT    Recommendations for Other Services       Precautions / Restrictions        Mobility  Bed Mobility               General bed mobility comments: pt in chair  Transfers Overall transfer level: Modified independent Equipment used: Rolling walker (2 wheeled) Transfers: Sit to/from Stand Sit to Stand: Modified independent (Device/Increase time);Independent            Ambulation/Gait Ambulation/Gait assistance: Supervision Ambulation Distance (Feet): 180 Feet Assistive device: None Gait Pattern/deviations: Step-through pattern     General Gait Details: pt wants to touch rail at times but no LOB during gait activities  Stairs            Wheelchair Mobility    Modified Rankin (Stroke Patients Only)       Balance     Sitting balance-Leahy Scale: Normal                       High level balance activites: Backward walking;Direction changes;Turns;Sudden stops;Head turns High Level Balance Comments: no LOB during dynamic gait activities             Pertinent Vitals/Pain Pain Assessment: Faces Faces Pain Scale: Hurts a little bit Pain Descriptors / Indicators:  Grimacing Pain Intervention(s): Limited activity within patient's tolerance    Home Living Family/patient expects to be discharged to:: Private residence Living Arrangements: Alone Available Help at Discharge:  (dtr near by, checks in and is avail if needed) Type of Home: [place]: One level Home Equipment: None Additional Comments: pt walks one mile a day    Prior Function Level of Independence: Independent               Hand Dominance        Extremity/Trunk Assessment   Upper Extremity Assessment: Overall WFL for tasks assessed           Lower Extremity Assessment: Overall WFL for tasks assessed         Communication   Communication: No difficulties  Cognition Arousal/Alertness: Awake/alert Behavior During Therapy: WFL for tasks assessed/performed Overall Cognitive Status: Within Functional Limits for tasks assessed                      General Comments      Exercises        Assessment/Plan    PT Assessment Patent does not need any further PT services  PT Diagnosis Difficulty walking   PT Problem List    PT Treatment Interventions     PT Goals (Current goals can be found in the Care Plan section) Acute Rehab PT Goals PT Goal Formulation: All  assessment and education complete, DC therapy    Frequency     Barriers to discharge        Co-evaluation               End of Session Equipment Utilized During Treatment: Gait belt Activity Tolerance: Patient tolerated treatment well Patient left: in chair;with call bell/phone within reach;with family/visitor present Nurse Communication: Mobility status         Time: 4037-5436 PT Time Calculation (min) (ACUTE ONLY): 14 min   Charges:   PT Evaluation $Initial PT Evaluation Tier I: 1 Procedure     PT G CodesKenyon Ana 12/21/2014, 5:01 PM

## 2014-12-16 NOTE — Progress Notes (Signed)
B/p 153/43 asymptomatic paged on call x 3 awaiting call baclk

## 2014-12-16 NOTE — Progress Notes (Addendum)
Patient ID: [name], female   DOB: [birth year], 81 y.o.   MRN: [id number] [facility] PROGRESS NOTE  [name] [id number]:[id number] DOB: Mar 28, 1932 DOA: 12/15/2014 PCP: Sallee Lange, MD  Brief narrative:    81 y.o. female with past medical history of hypertension, dyslipidemia, diabetes who presented to [facility] with nausea, vomiting, abdominal discomfort and increased urinary frequency over past few days prior to this admission. Patient reported nonbloody vomiting. No associated diarrhea, no fevers. No blood in the stool. On admission, patient was hemodynamically stable although she had slight tachycardia. Her white blood cell count was elevated at 17.1. Urinalysis revealed large leukocytes with many bacteria. CT abdomen showed gallstones without acute cholecystitis. She was started on empiric Rocephin for urinary tract infection.  Barrier to discharge: We will advance diet to clear liquids today and see how she tolerates it. We'll continue IV Rocephin and follow up on urine culture results. Once she tolerates by mouth intake, regular diet (carb modified) and she will be stable for discharge. I anticipate by 12/17/2014.  Assessment/Plan:    Principal Problem: Sepsis secondary to urinary tract infection / leukocytosis / nausea and vomiting - Sepsis criteria met on the admission with tachycardia, slight tachypnea, leukocytosis. Lactic acid and procalcitonin was within normal limits. Source of infection - urinary tract infection. Urinalysis on the admission showed large leukocytes and many bacteria. - Patient was started on empiric Rocephin. - Please follow up urine culture results. - Blood culture results are pending.  Active Problems:   Diabetes mellitus without complication - V0J in May 2016 was 6.9 indicating good glycemic control - Patient is on metformin at home but because of acute renal failure and acute infection metformin was held - Patient is currently on  sliding scale insulin only - CBGs in past 12 hours: 148, 130    Acute renal failure - Likely secondary to sepsis and urinary tract infection - Creatinine is now within normal limits. It has improved with IV fluids.    HLD (hyperlipidemia) - Continue gemfibrozil 600 mg daily    HTN (hypertension), essential - Continue Norvasc 10 mg daily     DVT Prophylaxis  - Heparin subQ ordered    Code Status: Partial code  Family Communication:  plan of care discussed with the patient Disposition Plan: Home once tolerates regular diet.  IV access:  Peripheral IV  Procedures and diagnostic studies:    Ct Abdomen Pelvis W Contrast 12/15/2014   1. No acute abnormality seen to explain the patient's symptoms. 2. Small amount of ascites noted within the abdomen and pelvis. This is of uncertain etiology. 3. Cholelithiasis; gallbladder otherwise unremarkable. 4. Diffuse calcification along the abdominal aorta and its branches. 5. Mild bibasilar atelectasis or scarring noted.   Electronically Signed   By: Garald Balding M.D.   On: 12/15/2014 20:21    Medical Consultants:  None   Other Consultants:  None   IAnti-Infectives:   Rocephin 12/15/2014 -->   Leisa Lenz, MD  [facility] Pager [phone]  Time spent in minutes: 25 minutes  If 7PM-7AM, please contact night-coverage [email or web address] Password [facility] 12/16/2014, 10:53 AM   LOS: 1 day    HPI/Subjective: No acute overnight events. Patient reports some nausea but on vomiting.   Objective: Filed Vitals:   12/15/14 2240 12/16/14 0546 12/16/14 0625 12/16/14 0936  BP: 124/53 123/36 153/43 121/47  Pulse: 67 76    Temp: 98.4 F (36.9 C) 98.6 F (37 C)    TempSrc: Oral Oral  Resp: 16 17    Weight:  80.5 kg (177 lb 7.5 oz)    SpO2: 98% 91%      Intake/Output Summary (Last 24 hours) at 12/16/14 1053 Last data filed at 12/16/14 0600  Gross per 24 hour  Intake 8763.75 ml  Output      0 ml  Net 8763.75 ml     Exam:   General:  Pt is alert, follows commands appropriately, not in acute distress  Cardiovascular: tachycardia, S1/S2 appreciated   Respiratory: Clear to auscultation bilaterally, no wheezing, no crackles, no rhonchi  Abdomen: Soft, non tender, non distended, bowel sounds present  Extremities: No edema, pulses DP and PT palpable bilaterally  Neuro: Grossly nonfocal  Data Reviewed: Basic Metabolic Panel:  Recent Labs Lab 12/15/14 1801 12/16/14 0145  NA 138 139  K 4.3 4.4  CL 102 105  CO2 25 26  GLUCOSE 148* 130*  BUN 34* 27*  CREATININE 1.07* 0.90  CALCIUM 10.5* 8.3*   Liver Function Tests:  Recent Labs Lab 12/15/14 1801  AST 25  ALT 18  ALKPHOS 93  BILITOT 0.8  PROT 8.3*  ALBUMIN 4.9    Recent Labs Lab 12/15/14 1801  LIPASE 20*   No results for input(s): AMMONIA in the last 168 hours. CBC:  Recent Labs Lab 12/15/14 1801 12/15/14 2317  WBC 17.1* 12.4*  NEUTROABS 14.6*  --   HGB 14.6 11.9*  HCT 42.0 36.1  MCV 90.7 90.3  PLT 367 265   Cardiac Enzymes: No results for input(s): CKTOTAL, CKMB, CKMBINDEX, TROPONINI in the last 168 hours. BNP: Invalid input(s): POCBNP CBG:  Recent Labs Lab 12/16/14 0754  GLUCAP 127*    Recent Results (from the past 240 hour(s))  Culture, blood (x 2)     Status: None (Preliminary result)   Collection Time: 12/15/14 10:34 PM  Result Value Ref Range Status   Specimen Description BLOOD LEFT ARM  Final   Special Requests BOTTLES DRAWN AEROBIC AND ANAEROBIC 10CC  Final   Culture PENDING  Incomplete   Report Status PENDING  Incomplete  Clostridium Difficile by PCR (not at [facility])     Status: None   Collection Time: 12/16/14  8:24 AM  Result Value Ref Range Status   C difficile by pcr NEGATIVE NEGATIVE Final     Scheduled Meds: . amLODipine  10 mg Oral Daily  . aspirin  81 mg Oral Daily  . beta carotene w/minerals  1 tablet Oral Daily  . cefTRIAXone (ROCEPHIN)  IV  1 g Intravenous Q24H  .  gemfibrozil  600 mg Oral QAC breakfast  . heparin  5,000 Units Subcutaneous 3 times per day  . insulin aspart  0-9 Units Subcutaneous TID WC  . multivitamin with minerals  1 tablet Oral Daily  . polyvinyl alcohol  1 drop Both Eyes BID  . cyanocobalamin  2,000 mcg Oral Daily   Continuous Infusions: . sodium chloride 75 mL/hr at 12/15/14 2349

## 2014-12-17 LAB — GLUCOSE, CAPILLARY
GLUCOSE-CAPILLARY: 119 mg/dL — AB (ref 65–99)
Glucose-Capillary: 108 mg/dL — ABNORMAL HIGH (ref 65–99)

## 2014-12-17 MED ORDER — CEFUROXIME AXETIL 250 MG PO TABS: 250.0000 mg | ORAL_TABLET | Freq: Two times a day (BID) | ORAL | Status: DC

## 2014-12-17 MED ORDER — GABAPENTIN 100 MG PO CAPS: 100.0000 mg | ORAL_CAPSULE | Freq: Three times a day (TID) | ORAL | Status: DC | PRN

## 2014-12-17 MED ORDER — ALPRAZOLAM 0.25 MG PO TABS: 0.2500 mg | ORAL_TABLET | Freq: Every evening | ORAL | Status: DC | PRN

## 2014-12-17 NOTE — Discharge Summary (Signed)
Physician Discharge Summary  [name] PVX:[id number] DOB: 06-12-1932 DOA: 12/15/2014  PCP: Sallee Lange, MD  Admit date: 12/15/2014 Discharge date: 12/17/2014   Recommendations for Outpatient Follow-Up:   1. Patient was instructed to follow-up with her PCP for any nonresolution of symptoms. Otherwise, she can follow-up at her regularly scheduled appointment in August. 2. The patient was discharged on Ceftin. PCP, please follow-up on final blood and urine cultures.   Discharge Diagnosis:   Principal Problem:    Sepsis secondary to UTI Active Problems:    Diabetes mellitus without complication    Nausea and vomiting    HLD (hyperlipidemia)    HTN (hypertension)    Leukocytosis   Discharge disposition:  Home.    Discharge Condition: Improved.  Diet recommendation:  Carbohydrate-modified.     History of Present Illness:   81 y.o. female with a PMH of hypertension, dyslipidemia, diabetes who was admitted 12/15/14 with nausea, vomiting, abdominal discomfort and increased urinary frequency over past few days prior to this admission. Patient reported nonbloody vomiting. No associated diarrhea, no fevers. No blood in the stool.  On admission, patient was hemodynamically stable although she had slight tachycardia. Her white blood cell count was elevated at 17.1. Urinalysis revealed large leukocytes with many bacteria. CT abdomen showed gallstones without acute cholecystitis. She was started on empiric Rocephin for urinary tract infection.   Hospital Course by Problem:    Principal Problem: Sepsis secondary to urinary tract infection / leukocytosis / nausea and vomiting - Sepsis criteria met on the admission with tachycardia, slight tachypnea, leukocytosis.  - Lactic acid and procalcitonin was within normal limits. Source of infection - urinary tract infection.  - Urinalysis on the admission showed large leukocytes and many bacteria.cultures are pending at the time of  this dictation. - Patient was improved on empiric Rocephin. Discharged home on an additional 5 day course of by mouth Ceftin.  Active Problems:  Diabetes mellitus without complication - S8O in May 2016 was 6.9 indicating good glycemic control. - Patient is on metformin at home but because of acute renal failure and acute infection metformin was held. - treated with sliding scale insulin while in the hospital, safe to resume metformin at discharge.   Acute renal failure - Likely secondary to sepsis and urinary tract infection. - Creatinine back to normal limits after hydrated.   HLD (hyperlipidemia) - Continue gemfibrozil 600 mg daily.   HTN (hypertension), essential - Continue Norvasc 10 mg daily.    Medical Consultants:    None.   Discharge Exam:   Filed Vitals:   12/17/14 0050  BP: 128/50  Pulse: 64  Temp: 98.4 F (36.9 C)  Resp: 18   Filed Vitals:   12/16/14 0936 12/16/14 1427 12/16/14 2200 12/17/14 0050  BP: 121/47 98/79 123/45 128/50  Pulse:  63 61 64  Temp:  97.7 F (36.5 C) 98 F (36.7 C) 98.4 F (36.9 C)  TempSrc:  Oral Oral Oral  Resp:  18 18 18   Weight:      SpO2:  98% 97% 96%    Gen:  NAD Cardiovascular:  RRR, No M/R/G Respiratory: Lungs CTAB Gastrointestinal: Abdomen soft, NT/ND with normal active bowel sounds. Extremities: No C/E/C   The results of significant diagnostics from this hospitalization (including imaging, microbiology, ancillary and laboratory) are listed below for reference.     Procedures and Diagnostic Studies:   Ct Abdomen Pelvis W Contrast  12/15/2014   CLINICAL DATA:  Acute onset of nausea and vomiting.  Abdominal cramping and pain. Initial encounter.  EXAM: CT ABDOMEN AND PELVIS WITH CONTRAST  TECHNIQUE: Multidetector CT imaging of the abdomen and pelvis was performed using the standard protocol following bolus administration of intravenous contrast.  CONTRAST:  150m OMNIPAQUE IOHEXOL 300 MG/ML  SOLN  COMPARISON:  CT  of the abdomen and pelvis performed 02/27/2006  FINDINGS: Mild bibasilar atelectasis or scarring is noted.  Trace fluid is noted tracking about the liver, and trace fluid is seen tracking about small bowel loops. A small amount of free fluid is seen within the pelvis.  The liver and spleen are unremarkable in appearance. Stones are noted dependently within the gallbladder. The gallbladder is otherwise unremarkable. The pancreas and adrenal glands are unremarkable.  The kidneys are unremarkable in appearance. There is no evidence of hydronephrosis. No renal or ureteral stones are seen. No perinephric stranding is appreciated.  No free fluid is identified. The small bowel is unremarkable in appearance. The stomach is within normal limits. No acute vascular abnormalities are seen. Diffuse calcification is seen along the abdominal aorta and its branches.  A prominent anterior abdominal wall mesh is noted; it is unremarkable in appearance.  The patient is status post appendectomy. An apparent small diverticulum is noted at the hepatic flexure of the colon. A single diverticulum is suggested along the distal descending colon. The colon is largely filled with fluid and is unremarkable in appearance.  The bladder is mildly distended and grossly unremarkable. The patient is status post hysterectomy. No suspicious adnexal masses are seen. No inguinal lymphadenopathy is seen.  No acute osseous abnormalities are identified.  IMPRESSION: 1. No acute abnormality seen to explain the patient's symptoms. 2. Small amount of ascites noted within the abdomen and pelvis. This is of uncertain etiology. 3. Cholelithiasis; gallbladder otherwise unremarkable. 4. Diffuse calcification along the abdominal aorta and its branches. 5. Mild bibasilar atelectasis or scarring noted.   Electronically Signed   By: JGarald BaldingM.D.   On: 12/15/2014 20:21     Labs:   Basic Metabolic Panel:  Recent Labs Lab 12/15/14 1801 12/16/14 0145  NA  138 139  K 4.3 4.4  CL 102 105  CO2 25 26  GLUCOSE 148* 130*  BUN 34* 27*  CREATININE 1.07* 0.90  CALCIUM 10.5* 8.3*   GFR Estimated Creatinine Clearance: 54.6 mL/min (by C-G formula based on Cr of 0.9). Liver Function Tests:  Recent Labs Lab 12/15/14 1801  AST 25  ALT 18  ALKPHOS 93  BILITOT 0.8  PROT 8.3*  ALBUMIN 4.9    Recent Labs Lab 12/15/14 1801  LIPASE 20*   Coagulation profile  Recent Labs Lab 12/15/14 2317  INR 1.09    CBC:  Recent Labs Lab 12/15/14 1801 12/15/14 2317  WBC 17.1* 12.4*  NEUTROABS 14.6*  --   HGB 14.6 11.9*  HCT 42.0 36.1  MCV 90.7 90.3  PLT 367 265   CBG:  Recent Labs Lab 12/16/14 1239 12/16/14 1650 12/16/14 2141 12/17/14 0716 12/17/14 1152  GLUCAP 106* 100* 84 119* 108*   Microbiology Recent Results (from the past 240 hour(s))  Culture, blood (x 2)     Status: None (Preliminary result)   Collection Time: 12/15/14 10:34 PM  Result Value Ref Range Status   Specimen Description BLOOD LEFT ARM  Final   Special Requests BOTTLES DRAWN AEROBIC AND ANAEROBIC 10CC  Final   Culture PENDING  Incomplete   Report Status PENDING  Incomplete  [facility]ium Difficile [facility]  Status: None   Collection Time: 12/16/14  8:24 AM  Result Value Ref Range Status   C difficile by pcr NEGATIVE NEGATIVE Final  MRSA PCR Screening     Status: None   Collection Time: 12/16/14  9:43 AM  Result Value Ref Range Status   MRSA by PCR NEGATIVE NEGATIVE Final    Comment:        The GeneXpert MRSA Assay (FDA approved for NASAL specimens only), is one component of a comprehensive MRSA colonization surveillance program. It is not intended to diagnose MRSA infection nor to guide or monitor treatment for MRSA infections.      Discharge Instructions:   Discharge Instructions    Call MD for:  extreme fatigue    Complete by:  As directed      Call MD for:  persistant nausea and vomiting    Complete by:  As directed       Call MD for:  temperature >100.4    Complete by:  As directed      Diet Carb Modified    Complete by:  As directed      Increase activity slowly    Complete by:  As directed             Medication List    TAKE these medications        ALPRAZolam 0.25 MG tablet  Commonly known as:  XANAX  Take 1 tablet (0.25 mg total) by mouth at bedtime as needed. for sleep     amLODipine 10 MG tablet  Commonly known as:  NORVASC  TAKE ONE TABLET BY MOUTH ONCE DAILY     aspirin 81 MG tablet  Take 81 mg by mouth daily.     beta carotene w/minerals tablet  Take 1 tablet by mouth daily.     cefUROXime 250 MG tablet  Commonly known as:  CEFTIN  Take 1 tablet (250 mg total) by mouth 2 (two) times daily with a meal.     cyanocobalamin 2000 MCG tablet  Take 2,000 mcg by mouth daily.     gabapentin 100 MG capsule  Commonly known as:  NEURONTIN  Take 1 capsule (100 mg total) by mouth 3 (three) times daily as needed (pain).     gemfibrozil 600 MG tablet  Commonly known as:  LOPID  Take 1 tablet (600 mg total) by mouth daily.     losartan-hydrochlorothiazide 100-25 MG per tablet  Commonly known as:  HYZAAR  TAKE ONE TABLET BY MOUTH ONCE DAILY     metFORMIN 1000 MG tablet  Commonly known as:  GLUCOPHAGE  Take 1 tablet (1,000 mg total) by mouth 2 (two) times daily.     multivitamin per tablet  Take 1 tablet by mouth daily.      ASK your doctor about these medications        polyvinyl alcohol 1.4 % ophthalmic solution  Commonly known as:  LIQUIFILM TEARS  Place 1 drop into both eyes 2 (two) times daily.     RED YEAST RICE PO  Take 1 capsule by mouth 2 (two) times daily.           Follow-up Information    Schedule an appointment as soon as possible for a visit with Sallee Lange, MD.   Specialty:  Family Medicine   Why:  If symptoms worsen, otherwise keep your regularly scheduled appt in August.   Contact information:   [address]  [id number] [phone]  Time coordinating discharge: 25 minutes.  Signed:  RAMA,CHRISTINA  Pager [phone] [facility] 12/17/2014, 1:06 PM

## 2014-12-17 NOTE — Progress Notes (Signed)
OT Cancellation Note  Patient Details Name: [name] MRN: [id number] DOB: 09-30-1931   Cancelled Treatment:    Reason Eval/Treat Not Completed: Other (comment).  Pt is planning to discharge today and reports that she dressed herself.  She does not feel she has any OT needs.  Will sign off.  Sharonda Llamas 12/17/2014, 12:26 PM  Lesle Chris, OTR/L [phone] 12/17/2014

## 2014-12-17 NOTE — Progress Notes (Signed)
Pt left at this time with her son and daughter-in-law. Alert, oriented, and without c/o. Discharge instructions/prescription given/explained with pt verbalizing understanding. Followup appt noted.

## 2014-12-18 LAB — URINE CULTURE: Culture: >=100000

## 2014-12-19 ENCOUNTER — Other Ambulatory Visit: Payer: Self-pay | Admitting: Family Medicine

## 2014-12-20 ENCOUNTER — Telehealth: Payer: Self-pay | Admitting: Family Medicine

## 2014-12-20 NOTE — Telephone Encounter (Addendum)
Patient advised the appt in July is to recheck urine and follow up on recent hospitalization and to keep appt for regular check up she already has scheduled for August. Patient verbalized understanding.

## 2014-12-20 NOTE — Telephone Encounter (Signed)
TCNA (Voicemail not set up). 12/20/14

## 2014-12-20 NOTE — Telephone Encounter (Signed)
Pt is stating that she has an appt in aug for her 3 mo med check  But you are requesting her to come in July for her hospital recheck She wants to know if you want to combine the two appts? Advised  Most likely not but I would send back a note to ask and if you do combine Them can you go ahead a order her blood work and call her when it's ready?  Please advise pt either way

## 2014-12-21 LAB — CULTURE, BLOOD (ROUTINE X 2)
CULTURE: NO GROWTH
Culture: NO GROWTH

## 2015-01-04 ENCOUNTER — Ambulatory Visit (INDEPENDENT_AMBULATORY_CARE_PROVIDER_SITE_OTHER): Payer: Medicare Other | Admitting: Family Medicine

## 2015-01-04 ENCOUNTER — Encounter: Payer: Self-pay | Admitting: Family Medicine

## 2015-01-04 VITALS — BP 138/70 | Ht 68.0 in | Wt 170.5 lb

## 2015-01-04 DIAGNOSIS — R3589 Other polyuria: Secondary | ICD-10-CM

## 2015-01-04 DIAGNOSIS — Z87440 Personal history of urinary (tract) infections: Secondary | ICD-10-CM

## 2015-01-04 DIAGNOSIS — R358 Other polyuria: Secondary | ICD-10-CM | POA: Diagnosis not present

## 2015-01-04 LAB — POCT URINALYSIS DIPSTICK
Blood, UA: NEGATIVE
Nitrite, UA: NEGATIVE
PH UA: 5
Spec Grav, UA: 1.01

## 2015-01-04 NOTE — Progress Notes (Signed)
   Subjective:    Patient ID: [name], female    DOB: Jun 04, 1932, 81 y.o.   MRN: [id number]  Urinary Tract Infection  This is a new problem. The current episode started 1 to 4 weeks ago. The problem has been rapidly improving. She is not sexually active. Pertinent negatives include no flank pain, frequency or nausea. She has tried antibiotics (Ceftin) for the symptoms. The treatment provided significant relief.   Patient relates her diabetes been under good control recently she did a low calcium in the hospital as well as low-sodiumPatient in for hospital follow up for UTI 12/15/14 at [facility]. Patient states no concerns this visit.  Review of Systems  Constitutional: Negative for fever and fatigue.  Respiratory: Negative for cough and shortness of breath.   Cardiovascular: Negative for chest pain.  Gastrointestinal: Negative for nausea and abdominal pain.  Genitourinary: Negative for dysuria, frequency and flank pain.       Objective:   Physical Exam  Constitutional: She appears well-nourished. No distress.  Cardiovascular: Normal rate, regular rhythm and normal heart sounds.   No murmur heard. Pulmonary/Chest: Effort normal and breath sounds normal. No respiratory distress.  Musculoskeletal: She exhibits no edema.  Lymphadenopathy:    She has no cervical adenopathy.  Neurological: She is alert. She exhibits normal muscle tone.  Psychiatric: Her behavior is normal.  Vitals reviewed.         Assessment & Plan:  Urinary tract infection seems to be passed we will send urine for culture warning signs regarding future infections were discussed her diabetes seems to be under decent control she will follow-up for regular checkup in August Hypocalcemia check metabolic 7

## 2015-01-05 LAB — BASIC METABOLIC PANEL
BUN / CREAT RATIO: 23 (ref 11–26)
BUN: 21 mg/dL (ref 8–27)
CO2: 24 mmol/L (ref 18–29)
Calcium: 10.5 mg/dL — ABNORMAL HIGH (ref 8.7–10.3)
Chloride: 99 mmol/L (ref 97–108)
Creatinine, Ser: 0.91 mg/dL (ref 0.57–1.00)
GFR calc Af Amer: 68 mL/min/{1.73_m2} (ref >59)
GFR, EST NON AFRICAN AMERICAN: 59 mL/min/{1.73_m2} — AB (ref >59)
GLUCOSE: 135 mg/dL — AB (ref 65–99)
Potassium: 4.6 mmol/L (ref 3.5–5.2)
SODIUM: 142 mmol/L (ref 134–144)

## 2015-01-06 ENCOUNTER — Other Ambulatory Visit: Payer: Self-pay | Admitting: *Deleted

## 2015-01-06 DIAGNOSIS — D510 Vitamin B12 deficiency anemia due to intrinsic factor deficiency: Secondary | ICD-10-CM

## 2015-01-06 DIAGNOSIS — Z79899 Other long term (current) drug therapy: Secondary | ICD-10-CM

## 2015-01-06 DIAGNOSIS — E119 Type 2 diabetes mellitus without complications: Secondary | ICD-10-CM

## 2015-01-06 DIAGNOSIS — E785 Hyperlipidemia, unspecified: Secondary | ICD-10-CM

## 2015-01-06 LAB — URINE CULTURE

## 2015-01-06 NOTE — Progress Notes (Signed)
LMRC

## 2015-01-16 ENCOUNTER — Telehealth: Payer: Self-pay | Admitting: Family Medicine

## 2015-01-16 NOTE — Telephone Encounter (Signed)
This week is quite busy, I could see her on Thursday to help fill out DMV form. This has multiple areas on it that it is best to fill this out face-to-face to avoid any errors that could jeopardize her license. Please schedule patient for Thursday. Please keep form with the chart until the patient comes in

## 2015-01-16 NOTE — Telephone Encounter (Signed)
Pt dropped off some forms to be filled out. Pt has not had a recent wellness visit but is willing to have one if need be.

## 2015-01-17 NOTE — Telephone Encounter (Signed)
Spoke with patient and informed her per Dr.Scott-that it is best to come in and have the DMV forms filled out face to face to avoid errors that could jeopardize her license and that [facility] could see her this Thursday to fill out forms. Patient verbalized understanding. Patient transferred to front desk to schedule appointment.

## 2015-01-19 ENCOUNTER — Ambulatory Visit (INDEPENDENT_AMBULATORY_CARE_PROVIDER_SITE_OTHER): Payer: Medicare Other | Admitting: Family Medicine

## 2015-01-19 DIAGNOSIS — E118 Type 2 diabetes mellitus with unspecified complications: Secondary | ICD-10-CM

## 2015-01-19 NOTE — Progress Notes (Signed)
   Subjective:    Patient ID: [name], female    DOB: 09-04-1931, 81 y.o.   MRN: [id number]  HPIpt arrives today to have forms filled out for [facility].  Pt states no concerns today.  Has appt august 10 for check up. Pt has orders to do bloodwork on august 8th.    Review of Systems Patient denies excessive thirst urination denies any low sugar spells denies passing out    Objective:   Physical Exam Lungs are clear hearts regular extremities no edema       Assessment & Plan:  Patient in today for discussion regarding driving. The state send her forms to ask if it is safe for her to drive. I believe it is safe for the patient to drive. I do not feel she needs driver evaluations on a yearly basis these are taking care of by her coming here every 3-4 months for checkups

## 2015-02-06 DIAGNOSIS — E119 Type 2 diabetes mellitus without complications: Secondary | ICD-10-CM | POA: Diagnosis not present

## 2015-02-06 DIAGNOSIS — D510 Vitamin B12 deficiency anemia due to intrinsic factor deficiency: Secondary | ICD-10-CM | POA: Diagnosis not present

## 2015-02-06 DIAGNOSIS — Z79899 Other long term (current) drug therapy: Secondary | ICD-10-CM | POA: Diagnosis not present

## 2015-02-06 DIAGNOSIS — E785 Hyperlipidemia, unspecified: Secondary | ICD-10-CM | POA: Diagnosis not present

## 2015-02-07 ENCOUNTER — Other Ambulatory Visit: Payer: Self-pay | Admitting: Family Medicine

## 2015-02-07 LAB — HEPATIC FUNCTION PANEL
ALK PHOS: 106 IU/L (ref 39–117)
ALT: 14 IU/L (ref 0–32)
AST: 13 IU/L (ref 0–40)
Albumin: 4.5 g/dL (ref 3.5–4.7)
Bilirubin Total: 0.4 mg/dL (ref 0.0–1.2)
Bilirubin, Direct: 0.11 mg/dL (ref 0.00–0.40)
TOTAL PROTEIN: 7 g/dL (ref 6.0–8.5)

## 2015-02-07 LAB — LIPID PANEL
CHOL/HDL RATIO: 3.7 ratio (ref 0.0–4.4)
CHOLESTEROL TOTAL: 147 mg/dL (ref 100–199)
HDL: 40 mg/dL (ref >39)
LDL Calculated: 57 mg/dL (ref 0–99)
Triglycerides: 252 mg/dL — ABNORMAL HIGH (ref 0–149)
VLDL Cholesterol Cal: 50 mg/dL — ABNORMAL HIGH (ref 5–40)

## 2015-02-07 LAB — BASIC METABOLIC PANEL
BUN/Creatinine Ratio: 41 — ABNORMAL HIGH (ref 11–26)
BUN: 37 mg/dL — ABNORMAL HIGH (ref 8–27)
CALCIUM: 9.9 mg/dL (ref 8.7–10.3)
CHLORIDE: 101 mmol/L (ref 97–108)
CO2: 20 mmol/L (ref 18–29)
CREATININE: 0.9 mg/dL (ref 0.57–1.00)
GFR calc Af Amer: 69 mL/min/{1.73_m2} (ref >59)
GFR calc non Af Amer: 60 mL/min/{1.73_m2} (ref >59)
GLUCOSE: 138 mg/dL — AB (ref 65–99)
Potassium: 4.4 mmol/L (ref 3.5–5.2)
SODIUM: 142 mmol/L (ref 134–144)

## 2015-02-07 LAB — HEMOGLOBIN A1C
ESTIMATED AVERAGE GLUCOSE: 146 mg/dL
Hgb A1c MFr Bld: 6.7 % — ABNORMAL HIGH (ref 4.8–5.6)

## 2015-02-07 LAB — VITAMIN B12: Vitamin B-12: 739 pg/mL (ref 211–946)

## 2015-02-07 NOTE — Telephone Encounter (Signed)
May fill this +4 refills

## 2015-02-08 ENCOUNTER — Encounter: Payer: Self-pay | Admitting: Family Medicine

## 2015-02-08 ENCOUNTER — Ambulatory Visit (INDEPENDENT_AMBULATORY_CARE_PROVIDER_SITE_OTHER): Payer: Medicare Other | Admitting: Family Medicine

## 2015-02-08 VITALS — BP 132/78 | Ht 68.0 in | Wt 171.2 lb

## 2015-02-08 DIAGNOSIS — E7800 Pure hypercholesterolemia, unspecified: Secondary | ICD-10-CM

## 2015-02-08 DIAGNOSIS — E118 Type 2 diabetes mellitus with unspecified complications: Secondary | ICD-10-CM

## 2015-02-08 DIAGNOSIS — I10 Essential (primary) hypertension: Secondary | ICD-10-CM | POA: Diagnosis not present

## 2015-02-08 NOTE — Progress Notes (Signed)
   Subjective:    Patient ID: [name], female    DOB: 1932/02/04, 81 y.o.   MRN: [id number]  Diabetes She presents for her follow-up diabetic visit. She has type 2 diabetes mellitus. Pertinent negatives for hypoglycemia include no confusion. Pertinent negatives for diabetes include no chest pain, no fatigue, no polydipsia, no polyphagia and no weakness. Risk factors for coronary artery disease include hypertension and dyslipidemia. Current diabetic treatment includes oral agent (monotherapy). She is compliant with treatment all of the time. She has not had a previous visit with a dietitian. She participates in exercise daily. She does not see a podiatrist.Eye exam is current.   patient's blood pressure under decent control she takes her medicine she does exercise on a regular basis She does try to watch fats in her diet and takes her medicines on regular basis  she also states he takes his Xanax at nighttime to help her with sleep she denies abusing it She also tolerates her aspirin 81 mg denies any abdominal pain or rectal bleeding.    Review of Systems  Constitutional: Negative for activity change, appetite change and fatigue.  HENT: Negative for congestion.   Respiratory: Negative for cough.   Cardiovascular: Negative for chest pain.  Gastrointestinal: Negative for abdominal pain.  Endocrine: Negative for polydipsia and polyphagia.  Neurological: Negative for weakness.  Psychiatric/Behavioral: Negative for confusion.       Objective:   Physical Exam  Constitutional: She appears well-nourished. No distress.  Cardiovascular: Normal rate, regular rhythm and normal heart sounds.   No murmur heard. Pulmonary/Chest: Effort normal and breath sounds normal. No respiratory distress.  Musculoskeletal: She exhibits no edema.  Lymphadenopathy:    She has no cervical adenopathy.  Neurological: She is alert. She exhibits normal muscle tone.  Psychiatric: Her behavior is normal.    Vitals reviewed.         Assessment & Plan:  1. Type 2 diabetes mellitus with complication  Diabetes overall decent control continue current measures watch diet closely  2. Hypercholesterolemia  hyperlipidemia lipid profile looks good continue current measures  3. Essential hypertension  blood pressure under good control. Continue current measures watch diet closely   Diabetic foot exam was completed. Patient does have pre-ulcerative calluses with bunions and some peripheral neuropathy I recommend diabetic shoes for the patient.

## 2015-04-01 ENCOUNTER — Other Ambulatory Visit: Payer: Self-pay | Admitting: Family Medicine

## 2015-04-10 DIAGNOSIS — Z23 Encounter for immunization: Secondary | ICD-10-CM | POA: Diagnosis not present

## 2015-05-11 ENCOUNTER — Telehealth: Payer: Self-pay | Admitting: Family Medicine

## 2015-05-11 DIAGNOSIS — E785 Hyperlipidemia, unspecified: Secondary | ICD-10-CM

## 2015-05-11 DIAGNOSIS — E119 Type 2 diabetes mellitus without complications: Secondary | ICD-10-CM

## 2015-05-11 NOTE — Telephone Encounter (Signed)
Pt would like to have bw orders so she can get them before her appt next Thursday   Last labs 02-06-15 Lip, Hep, BMP, A1C, B12

## 2015-05-11 NOTE — Telephone Encounter (Signed)
A1c, lipid.

## 2015-05-12 NOTE — Telephone Encounter (Signed)
bw orders put in. Pt notified on vm.

## 2015-05-13 DIAGNOSIS — E785 Hyperlipidemia, unspecified: Secondary | ICD-10-CM | POA: Diagnosis not present

## 2015-05-13 DIAGNOSIS — E119 Type 2 diabetes mellitus without complications: Secondary | ICD-10-CM | POA: Diagnosis not present

## 2015-05-15 LAB — HEMOGLOBIN A1C
ESTIMATED AVERAGE GLUCOSE: 140 mg/dL
Hgb A1c MFr Bld: 6.5 % — ABNORMAL HIGH (ref 4.8–5.6)

## 2015-05-15 LAB — LIPID PANEL
CHOLESTEROL TOTAL: 169 mg/dL (ref 100–199)
Chol/HDL Ratio: 3.4 ratio units (ref 0.0–4.4)
HDL: 49 mg/dL (ref >39)
LDL Calculated: 94 mg/dL (ref 0–99)
Triglycerides: 130 mg/dL (ref 0–149)
VLDL Cholesterol Cal: 26 mg/dL (ref 5–40)

## 2015-05-18 ENCOUNTER — Encounter: Payer: Self-pay | Admitting: Family Medicine

## 2015-05-18 ENCOUNTER — Ambulatory Visit (INDEPENDENT_AMBULATORY_CARE_PROVIDER_SITE_OTHER): Payer: Medicare Other | Admitting: Family Medicine

## 2015-05-18 VITALS — BP 144/72 | Ht 68.0 in | Wt 169.0 lb

## 2015-05-18 DIAGNOSIS — I10 Essential (primary) hypertension: Secondary | ICD-10-CM | POA: Diagnosis not present

## 2015-05-18 DIAGNOSIS — E785 Hyperlipidemia, unspecified: Secondary | ICD-10-CM | POA: Diagnosis not present

## 2015-05-18 DIAGNOSIS — G4700 Insomnia, unspecified: Secondary | ICD-10-CM | POA: Diagnosis not present

## 2015-05-18 DIAGNOSIS — E119 Type 2 diabetes mellitus without complications: Secondary | ICD-10-CM

## 2015-05-18 MED ORDER — TRAZODONE HCL 50 MG PO TABS
25.0000 mg | ORAL_TABLET | Freq: Every evening | ORAL | Status: DC | PRN
Start: 2015-05-18 — End: 2015-06-15

## 2015-05-18 NOTE — Progress Notes (Signed)
   Subjective:    Patient ID: [name], female    DOB: Nov 29, 1931, 81 y.o.   MRN: [id number]  Diabetes She presents for her follow-up diabetic visit. She has type 2 diabetes mellitus. Pertinent negatives for hypoglycemia include no confusion. Pertinent negatives for diabetes include no chest pain, no fatigue, no polydipsia, no polyphagia and no weakness. She is compliant with treatment all of the time. She is following a diabetic diet. Exercise: walks daily. Her breakfast blood glucose range is generally 140-180 mg/dl. She does not see a podiatrist.Eye exam is current.  A1C on bloodwork.  Patient relates taken her blood pressure medicine regular basis watch his diet Does do some walking on is much as she used to Patient does take her cholesterol medicine on a regular basis. Watch his diet. Pt requesting trazadone to help sleep.   Review of Systems  Constitutional: Negative for activity change, appetite change and fatigue.  HENT: Negative for congestion.   Respiratory: Negative for cough.   Cardiovascular: Negative for chest pain.  Gastrointestinal: Negative for abdominal pain.  Endocrine: Negative for polydipsia and polyphagia.  Neurological: Negative for weakness.  Psychiatric/Behavioral: Negative for confusion.       Objective:   Physical Exam  Constitutional: She appears well-nourished. No distress.  Cardiovascular: Normal rate, regular rhythm and normal heart sounds.   No murmur heard. Pulmonary/Chest: Effort normal and breath sounds normal. No respiratory distress.  Musculoskeletal: She exhibits no edema.  Lymphadenopathy:    She has no cervical adenopathy.  Neurological: She is alert. She exhibits normal muscle tone.  Psychiatric: Her behavior is normal.  Vitals reviewed.    Gemfibrozil was stopped.     Assessment & Plan:  Diabetes good control continue current measures A1c looks good Hyperlipidemia lab work looks good use red rice yeast extract only watch  diet Blood pressure good control continue current measures Stress due to the loss of her brother sickness of the other brother Insomnia-trazodone should help caution drowsiness

## 2015-06-07 DIAGNOSIS — M1711 Unilateral primary osteoarthritis, right knee: Secondary | ICD-10-CM | POA: Diagnosis not present

## 2015-06-12 ENCOUNTER — Other Ambulatory Visit: Payer: Self-pay | Admitting: Family Medicine

## 2015-06-14 DIAGNOSIS — M1711 Unilateral primary osteoarthritis, right knee: Secondary | ICD-10-CM | POA: Diagnosis not present

## 2015-06-15 ENCOUNTER — Encounter (HOSPITAL_COMMUNITY): Payer: Self-pay | Admitting: Emergency Medicine

## 2015-06-15 ENCOUNTER — Emergency Department (HOSPITAL_COMMUNITY): Payer: Medicare Other

## 2015-06-15 ENCOUNTER — Inpatient Hospital Stay (HOSPITAL_COMMUNITY)
Admission: EM | Admit: 2015-06-15 | Discharge: 2015-06-21 | DRG: 389 | Disposition: A | Discharge status: Home / Self Care | Payer: Medicare Other | Attending: Internal Medicine | Admitting: Internal Medicine

## 2015-06-15 DIAGNOSIS — Z833 Family history of diabetes mellitus: Secondary | ICD-10-CM | POA: Diagnosis not present

## 2015-06-15 DIAGNOSIS — R140 Abdominal distension (gaseous): Secondary | ICD-10-CM

## 2015-06-15 DIAGNOSIS — Z9049 Acquired absence of other specified parts of digestive tract: Secondary | ICD-10-CM | POA: Diagnosis not present

## 2015-06-15 DIAGNOSIS — Z7982 Long term (current) use of aspirin: Secondary | ICD-10-CM | POA: Diagnosis not present

## 2015-06-15 DIAGNOSIS — I700 Atherosclerosis of aorta: Secondary | ICD-10-CM

## 2015-06-15 DIAGNOSIS — Z809 Family history of malignant neoplasm, unspecified: Secondary | ICD-10-CM | POA: Diagnosis not present

## 2015-06-15 DIAGNOSIS — K56600 Partial intestinal obstruction, unspecified as to cause: Secondary | ICD-10-CM | POA: Diagnosis present

## 2015-06-15 DIAGNOSIS — K5669 Other intestinal obstruction: Secondary | ICD-10-CM

## 2015-06-15 DIAGNOSIS — R1030 Lower abdominal pain, unspecified: Secondary | ICD-10-CM | POA: Diagnosis not present

## 2015-06-15 DIAGNOSIS — I10 Essential (primary) hypertension: Secondary | ICD-10-CM | POA: Diagnosis present

## 2015-06-15 DIAGNOSIS — N3000 Acute cystitis without hematuria: Secondary | ICD-10-CM | POA: Diagnosis not present

## 2015-06-15 DIAGNOSIS — K50012 Crohn's disease of small intestine with intestinal obstruction: Secondary | ICD-10-CM | POA: Diagnosis present

## 2015-06-15 DIAGNOSIS — D72829 Elevated white blood cell count, unspecified: Secondary | ICD-10-CM | POA: Diagnosis present

## 2015-06-15 DIAGNOSIS — Z8249 Family history of ischemic heart disease and other diseases of the circulatory system: Secondary | ICD-10-CM

## 2015-06-15 DIAGNOSIS — E785 Hyperlipidemia, unspecified: Secondary | ICD-10-CM | POA: Diagnosis not present

## 2015-06-15 DIAGNOSIS — N390 Urinary tract infection, site not specified: Secondary | ICD-10-CM | POA: Diagnosis not present

## 2015-06-15 DIAGNOSIS — R1084 Generalized abdominal pain: Secondary | ICD-10-CM

## 2015-06-15 DIAGNOSIS — M1711 Unilateral primary osteoarthritis, right knee: Secondary | ICD-10-CM | POA: Diagnosis not present

## 2015-06-15 DIAGNOSIS — K8020 Calculus of gallbladder without cholecystitis without obstruction: Secondary | ICD-10-CM | POA: Diagnosis not present

## 2015-06-15 DIAGNOSIS — Z79899 Other long term (current) drug therapy: Secondary | ICD-10-CM

## 2015-06-15 DIAGNOSIS — B9620 Unspecified Escherichia coli [E. coli] as the cause of diseases classified elsewhere: Secondary | ICD-10-CM | POA: Diagnosis present

## 2015-06-15 DIAGNOSIS — R933 Abnormal findings on diagnostic imaging of other parts of digestive tract: Secondary | ICD-10-CM

## 2015-06-15 DIAGNOSIS — E119 Type 2 diabetes mellitus without complications: Secondary | ICD-10-CM | POA: Diagnosis present

## 2015-06-15 DIAGNOSIS — E1151 Type 2 diabetes mellitus with diabetic peripheral angiopathy without gangrene: Secondary | ICD-10-CM

## 2015-06-15 DIAGNOSIS — R112 Nausea with vomiting, unspecified: Secondary | ICD-10-CM | POA: Diagnosis present

## 2015-06-15 DIAGNOSIS — K529 Noninfective gastroenteritis and colitis, unspecified: Secondary | ICD-10-CM

## 2015-06-15 DIAGNOSIS — Z823 Family history of stroke: Secondary | ICD-10-CM | POA: Diagnosis not present

## 2015-06-15 DIAGNOSIS — R109 Unspecified abdominal pain: Secondary | ICD-10-CM

## 2015-06-15 LAB — COMPREHENSIVE METABOLIC PANEL
ALT: 19 U/L (ref 14–54)
AST: 24 U/L (ref 15–41)
Albumin: 4.8 g/dL (ref 3.5–5.0)
Alkaline Phosphatase: 101 U/L (ref 38–126)
Anion gap: 13 (ref 5–15)
BILIRUBIN TOTAL: 0.9 mg/dL (ref 0.3–1.2)
BUN: 34 mg/dL — AB (ref 6–20)
CALCIUM: 10.3 mg/dL (ref 8.9–10.3)
CO2: 24 mmol/L (ref 22–32)
CREATININE: 0.99 mg/dL (ref 0.44–1.00)
Chloride: 100 mmol/L — ABNORMAL LOW (ref 101–111)
GFR calc Af Amer: >60 mL/min (ref >60)
GFR, EST NON AFRICAN AMERICAN: 52 mL/min — AB (ref >60)
Glucose, Bld: 187 mg/dL — ABNORMAL HIGH (ref 65–99)
POTASSIUM: 4 mmol/L (ref 3.5–5.1)
Sodium: 137 mmol/L (ref 135–145)
TOTAL PROTEIN: 8.1 g/dL (ref 6.5–8.1)

## 2015-06-15 LAB — CBC WITH DIFFERENTIAL/PLATELET
BASOS ABS: 0 10*3/uL (ref 0.0–0.1)
BASOS PCT: 0 %
Basophils Absolute: 0 10*3/uL (ref 0.0–0.1)
Basophils Relative: 0 %
EOS ABS: 0.3 10*3/uL (ref 0.0–0.7)
EOS PCT: 2 %
EOS PCT: 2 %
Eosinophils Absolute: 0.2 10*3/uL (ref 0.0–0.7)
HCT: 42.1 % (ref 36.0–46.0)
HCT: 37.6 % (ref 36.0–46.0)
HEMOGLOBIN: 12.2 g/dL (ref 12.0–15.0)
Hemoglobin: 14 g/dL (ref 12.0–15.0)
LYMPHS ABS: 1.7 10*3/uL (ref 0.7–4.0)
LYMPHS ABS: 1.5 10*3/uL (ref 0.7–4.0)
LYMPHS PCT: 12 %
Lymphocytes Relative: 12 %
MCH: 30.5 pg (ref 26.0–34.0)
MCH: 30.5 pg (ref 26.0–34.0)
MCHC: 33.3 g/dL (ref 30.0–36.0)
MCHC: 32.4 g/dL (ref 30.0–36.0)
MCV: 91.7 fL (ref 78.0–100.0)
MCV: 94 fL (ref 78.0–100.0)
Monocytes Absolute: 0.7 10*3/uL (ref 0.1–1.0)
Monocytes Absolute: 0.9 10*3/uL (ref 0.1–1.0)
Monocytes Relative: 5 %
Monocytes Relative: 7 %
NEUTROS PCT: 79 %
Neutro Abs: 11.3 10*3/uL — ABNORMAL HIGH (ref 1.7–7.7)
Neutro Abs: 9.4 10*3/uL — ABNORMAL HIGH (ref 1.7–7.7)
Neutrophils Relative %: 81 %
PLATELETS: 341 10*3/uL (ref 150–400)
Platelets: 294 10*3/uL (ref 150–400)
RBC: 4.59 MIL/uL (ref 3.87–5.11)
RBC: 4 MIL/uL (ref 3.87–5.11)
RDW: 12.8 % (ref 11.5–15.5)
RDW: 13 % (ref 11.5–15.5)
WBC: 13.8 10*3/uL — ABNORMAL HIGH (ref 4.0–10.5)
WBC: 12.1 10*3/uL — AB (ref 4.0–10.5)

## 2015-06-15 LAB — URINALYSIS, ROUTINE W REFLEX MICROSCOPIC
Bilirubin Urine: NEGATIVE
GLUCOSE, UA: NEGATIVE mg/dL
HGB URINE DIPSTICK: NEGATIVE
KETONES UR: NEGATIVE mg/dL
Nitrite: POSITIVE — AB
Protein, ur: NEGATIVE mg/dL
Specific Gravity, Urine: 1.019 (ref 1.005–1.030)
pH: 5 (ref 5.0–8.0)

## 2015-06-15 LAB — CBG MONITORING, ED: Glucose-Capillary: 171 mg/dL — ABNORMAL HIGH (ref 65–99)

## 2015-06-15 LAB — URINE MICROSCOPIC-ADD ON

## 2015-06-15 LAB — I-STAT CG4 LACTIC ACID, ED: Lactic Acid, Venous: 2.25 mmol/L (ref 0.5–2.0)

## 2015-06-15 LAB — LIPASE, BLOOD: LIPASE: 47 U/L (ref 11–51)

## 2015-06-15 LAB — LACTIC ACID, PLASMA: Lactic Acid, Venous: 1.1 mmol/L (ref 0.5–2.0)

## 2015-06-15 MED ORDER — ONDANSETRON HCL 4 MG/2ML IJ SOLN: 4.0000 mg | Freq: Four times a day (QID) | INTRAMUSCULAR | Status: DC | PRN

## 2015-06-15 MED ORDER — SODIUM CHLORIDE 0.9 % IV BOLUS (SEPSIS)
1000.0000 mL | Freq: Once | INTRAVENOUS | Status: AC
  Administered 2015-06-15: 1000 mL via INTRAVENOUS

## 2015-06-15 MED ORDER — METRONIDAZOLE IN NACL 5-0.79 MG/ML-% IV SOLN
500.0000 mg | Freq: Once | INTRAVENOUS | Status: AC
  Administered 2015-06-15: 500 mg via INTRAVENOUS
  Filled 2015-06-15: qty 100

## 2015-06-15 MED ORDER — ALPRAZOLAM 0.25 MG PO TABS
0.2500 mg | ORAL_TABLET | Freq: Every day | ORAL | Status: DC
  Administered 2015-06-15 – 2015-06-20 (×6): 0.25 mg via ORAL
  Filled 2015-06-15 (×6): qty 1

## 2015-06-15 MED ORDER — MORPHINE SULFATE (PF) 2 MG/ML IV SOLN
2.0000 mg | INTRAVENOUS | Status: DC | PRN
  Administered 2015-06-15: 2 mg via INTRAVENOUS
  Filled 2015-06-15: qty 1

## 2015-06-15 MED ORDER — SODIUM CHLORIDE 0.9 % IV BOLUS (SEPSIS): 1000.0000 mL | Freq: Once | INTRAVENOUS | Status: DC

## 2015-06-15 MED ORDER — CIPROFLOXACIN IN D5W 400 MG/200ML IV SOLN
400.0000 mg | Freq: Two times a day (BID) | INTRAVENOUS | Status: DC
  Administered 2015-06-15: 400 mg via INTRAVENOUS
  Filled 2015-06-15: qty 200

## 2015-06-15 MED ORDER — DEXTROSE 10 % IV SOLN: INTRAVENOUS | Status: DC

## 2015-06-15 MED ORDER — ADULT MULTIVITAMIN W/MINERALS CH
1.0000 | ORAL_TABLET | Freq: Every day | ORAL | Status: DC
  Administered 2015-06-15 – 2015-06-17 (×3): 1 via ORAL
  Filled 2015-06-15 (×3): qty 1

## 2015-06-15 MED ORDER — MORPHINE SULFATE (PF) 4 MG/ML IV SOLN
4.0000 mg | Freq: Once | INTRAVENOUS | Status: AC
  Administered 2015-06-15: 4 mg via INTRAVENOUS
  Filled 2015-06-15: qty 1

## 2015-06-15 MED ORDER — ONDANSETRON HCL 4 MG/2ML IJ SOLN
4.0000 mg | Freq: Four times a day (QID) | INTRAMUSCULAR | Status: DC
  Administered 2015-06-15 – 2015-06-16 (×5): 4 mg via INTRAVENOUS
  Filled 2015-06-15 (×7): qty 2

## 2015-06-15 MED ORDER — IOHEXOL 300 MG/ML  SOLN
25.0000 mL | Freq: Once | INTRAMUSCULAR | Status: AC | PRN
  Administered 2015-06-15: 25 mL via ORAL

## 2015-06-15 MED ORDER — DEXTROSE 50 % IV SOLN: 1.0000 | Freq: Once | INTRAVENOUS | Status: DC

## 2015-06-15 MED ORDER — ONDANSETRON HCL 4 MG/2ML IJ SOLN
4.0000 mg | Freq: Once | INTRAMUSCULAR | Status: AC
  Administered 2015-06-15: 4 mg via INTRAVENOUS
  Filled 2015-06-15: qty 2

## 2015-06-15 MED ORDER — GEMFIBROZIL 600 MG PO TABS
600.0000 mg | ORAL_TABLET | Freq: Every day | ORAL | Status: DC
  Administered 2015-06-15 – 2015-06-21 (×7): 600 mg via ORAL
  Filled 2015-06-15 (×7): qty 1

## 2015-06-15 MED ORDER — AMLODIPINE BESYLATE 10 MG PO TABS
10.0000 mg | ORAL_TABLET | Freq: Every day | ORAL | Status: DC
  Administered 2015-06-15 – 2015-06-21 (×7): 10 mg via ORAL
  Filled 2015-06-15 (×7): qty 1

## 2015-06-15 MED ORDER — ASPIRIN 81 MG PO CHEW
81.0000 mg | CHEWABLE_TABLET | Freq: Every day | ORAL | Status: DC
  Administered 2015-06-15 – 2015-06-21 (×7): 81 mg via ORAL
  Filled 2015-06-15 (×7): qty 1

## 2015-06-15 MED ORDER — LIP MEDEX EX OINT
TOPICAL_OINTMENT | CUTANEOUS | Status: DC | PRN
  Filled 2015-06-15: qty 7

## 2015-06-15 MED ORDER — IOHEXOL 300 MG/ML  SOLN
100.0000 mL | Freq: Once | INTRAMUSCULAR | Status: AC | PRN
  Administered 2015-06-15: 100 mL via INTRAVENOUS

## 2015-06-15 MED ORDER — SODIUM CHLORIDE 0.9 % IV SOLN
INTRAVENOUS | Status: DC
  Administered 2015-06-15: 125 mL/h via INTRAVENOUS
  Administered 2015-06-16: 22:00:00 via INTRAVENOUS
  Administered 2015-06-16: 125 mL/h via INTRAVENOUS
  Administered 2015-06-16 – 2015-06-19 (×7): via INTRAVENOUS
  Administered 2015-06-20: 75 mL/h via INTRAVENOUS

## 2015-06-15 MED ORDER — DEXTROSE 5 % IV SOLN
2.0000 g | INTRAVENOUS | Status: DC
  Administered 2015-06-15 – 2015-06-20 (×6): 2 g via INTRAVENOUS
  Administered 2015-06-21: 2 mg via INTRAVENOUS
  Filled 2015-06-15 (×7): qty 2

## 2015-06-15 MED ORDER — HEPARIN SODIUM (PORCINE) 5000 UNIT/ML IJ SOLN
5000.0000 [IU] | Freq: Three times a day (TID) | INTRAMUSCULAR | Status: DC
  Administered 2015-06-15 – 2015-06-21 (×19): 5000 [IU] via SUBCUTANEOUS
  Filled 2015-06-15 (×22): qty 1

## 2015-06-15 MED ORDER — METRONIDAZOLE IN NACL 5-0.79 MG/ML-% IV SOLN
500.0000 mg | Freq: Three times a day (TID) | INTRAVENOUS | Status: DC
  Administered 2015-06-15 – 2015-06-19 (×12): 500 mg via INTRAVENOUS
  Filled 2015-06-15 (×13): qty 100

## 2015-06-15 NOTE — H&P (Signed)
[facility] History and Physical  [name] [id number] DOB: 1931-12-08 DOA: 06/15/2015  Referring physician: EDP PCP: Sallee Lange, MD   Chief Complaint: Abd pain   HPI: [name] is a 81 y.o. female with h/o HTN, DM, diverticulitis.  Patient presents to the ED with c/o abdominal pain.  Pain is generalized throughout abdomen, there is associated nausea but no vomiting.  Last BM was yesterday.  Last passed gas at that time.  Since then has tried fleets enema with no relief.  No fever, no dysuria, no SOB.  No prior h/o SBO but has had multiple abd surgeries in the past including hemicolectomy for diverticular disease, appendectomy, TAH.  Review of Systems: Systems reviewed.  As above, otherwise negative  Past Medical History  Diagnosis Date  . Hypertension   . Diabetes mellitus   . Hyperlipidemia   . Osteoarthritis   . History of hysterectomy   . Diverticulitis     multilple surgeries  . Hypertension 02/13/2011  . Gallstones    Past Surgical History  Procedure Laterality Date  . Appendectomy    . Breast reduction surgery    . Knee surgery    . Refractive surgery    . Cardiac catheterization  06/01/2003    EF is between 60-65% -- Smooth and normal coronary arteries -- Normal left ventricular systolic function  -- We will continue with medical therapy for her hypertensio  . Laparoscopic incisional / umbilical / ventral hernia repair  02/23/2007   . Colon surgery      left  . Abdominal hysterectomy    . Eye surgery  03/30/2009    cataract   Social History:  reports that she has never smoked. She has never used smokeless tobacco. She reports that she does not drink alcohol or use illicit drugs.  Allergies  Allergen Reactions  . Phenergan [Promethazine Hcl]     hallucinations  . Prednisone     rash  . Statins     Severe myalgias    Family History  Problem Relation Age of Onset  . Cancer Mother     ovaian  . Hypertension Father     kidney  .  Stroke Father   . Cancer Father   . Diabetes Brother   . Hypertension Sister      Prior to Admission medications   Medication Sig Start Date End Date Taking? Authorizing Provider  ALPRAZolam Duanne Moron) 0.25 MG tablet TAKE ONE TABLET BY MOUTH AT BEDTIME 02/08/15  Yes Kathyrn Drown, MD  amLODipine (NORVASC) 10 MG tablet TAKE ONE TABLET BY MOUTH ONCE DAILY 06/12/15  Yes Kathyrn Drown, MD  aspirin 81 MG tablet Take 81 mg by mouth daily.     Yes Historical Provider, MD  beta carotene w/minerals (OCUVITE) tablet Take 1 tablet by mouth daily.   Yes Historical Provider, MD  cyanocobalamin 2000 MCG tablet Take 2,000 mcg by mouth daily.   Yes Historical Provider, MD  gemfibrozil (LOPID) 600 MG tablet TAKE ONE TABLET BY MOUTH ONCE DAILY 06/12/15  Yes Kathyrn Drown, MD  losartan-hydrochlorothiazide (HYZAAR) 100-25 MG tablet TAKE ONE TABLET BY MOUTH ONCE DAILY 06/12/15  Yes Kathyrn Drown, MD  metFORMIN (GLUCOPHAGE) 1000 MG tablet TAKE ONE TABLET BY MOUTH TWICE DAILY **INCREASED  DOSE** 04/03/15  Yes Kathyrn Drown, MD  multivitamin [facility]) per tablet Take 1 tablet by mouth daily.     Yes Historical Provider, MD  polyvinyl alcohol (LIQUIFILM TEARS) 1.4 % ophthalmic solution Place 1  drop into both eyes 2 (two) times daily.   Yes Historical Provider, MD  Red Yeast Rice Extract (RED YEAST RICE PO) Take 1 capsule by mouth 2 (two) times daily.     Yes Historical Provider, MD   Physical Exam: Filed Vitals:   06/15/15 0132 06/15/15 0545  BP: 168/71 149/48  Pulse: 83 66  Temp: 97.9 F (36.6 C) 98.1 F (36.7 C)  Resp: 20 18    BP 149/48 mmHg  Pulse 66  Temp(Src) 98.1 F (36.7 C) (Oral)  Resp 18  Ht 5\' 8"  (1.727 m)  Wt 76.7 kg (169 lb 1.5 oz)  BMI 25.72 kg/m2  SpO2 97%  General Appearance:    Alert, oriented, no distress, appears stated age  Head:    Normocephalic, atraumatic  Eyes:    PERRL, EOMI, sclera non-icteric        Nose:   Nares without drainage or epistaxis. Mucosa, turbinates  normal  Throat:   Moist mucous membranes. Oropharynx without erythema or exudate.  Neck:   Supple. No carotid bruits.  No thyromegaly.  No lymphadenopathy.   Back:     No CVA tenderness, no spinal tenderness  Lungs:     Clear to auscultation bilaterally, without wheezes, rhonchi or rales  Chest wall:    No tenderness to palpitation  Heart:    Regular rate and rhythm without murmurs, gallops, rubs  Abdomen:     Soft, non-tender, nondistended, normal bowel sounds, no organomegaly  Genitalia:    deferred  Rectal:    deferred  Extremities:   No clubbing, cyanosis or edema.  Pulses:   2+ and symmetric all extremities  Skin:   Skin color, texture, turgor normal, no rashes or lesions  Lymph nodes:   Cervical, supraclavicular, and axillary nodes normal  Neurologic:   CNII-XII intact. Normal strength, sensation and reflexes      throughout    Labs on Admission:  Basic Metabolic Panel:  Recent Labs Lab 06/15/15 0151  NA 137  K 4.0  CL 100*  CO2 24  GLUCOSE 187*  BUN 34*  CREATININE 0.99  CALCIUM 10.3   Liver Function Tests:  Recent Labs Lab 06/15/15 0151  AST 24  ALT 19  ALKPHOS 101  BILITOT 0.9  PROT 8.1  ALBUMIN 4.8    Recent Labs Lab 06/15/15 0151  LIPASE 47   No results for input(s): AMMONIA in the last 168 hours. CBC:  Recent Labs Lab 06/15/15 0151  WBC 13.8*  NEUTROABS 11.3*  HGB 14.0  HCT 42.1  MCV 91.7  PLT 341   Cardiac Enzymes: No results for input(s): CKTOTAL, CKMB, CKMBINDEX, TROPONINI in the last 168 hours.  BNP (last 3 results)  Recent Labs  06/19/14 1213  PROBNP 109.9   CBG:  Recent Labs Lab 06/15/15 0159  GLUCAP 171*    Radiological Exams on Admission: Ct Abdomen Pelvis W Contrast  06/15/2015  CLINICAL DATA:  Acute onset of lower abdominal pain and leukocytosis. Initial encounter. EXAM: CT ABDOMEN AND PELVIS WITH CONTRAST TECHNIQUE: Multidetector CT imaging of the abdomen and pelvis was performed using the standard protocol  following bolus administration of intravenous contrast. CONTRAST:  151mL OMNIPAQUE IOHEXOL 300 MG/ML  SOLN COMPARISON:  CT of the abdomen and pelvis from 12/15/2014 FINDINGS: Minimal bibasilar atelectasis is noted. A small hiatal hernia is noted. A small amount of free fluid is noted tracking about the liver and spleen, likely reflecting the acute ileal process. The liver and spleen are unremarkable in appearance.  Stones are noted dependently within the gallbladder. The gallbladder is otherwise unremarkable. The pancreas and adrenal glands are unremarkable. The kidneys are unremarkable in appearance. There is no evidence of hydronephrosis. No renal or ureteral stones are seen. No perinephric stranding is appreciated. There is dilatation of small bowel loops up to 3.6 cm in maximal diameter, with fecalization noted at the mid to distal ileum at the mid abdomen, just below the umbilicus, and a diffusely thick walled segment of distal ileum distal to the fecalization, compatible with acute infectious or inflammatory ileitis. Associated free fluid and mild mesenteric inflammation are seen. More distal small bowel is decompressed. This reflects partial obstruction due to the ileitis. No free fluid is identified. The small bowel is unremarkable in appearance. The stomach is within normal limits. No acute vascular abnormalities are seen. Diffuse calcification is noted along the abdominal aorta and its branches. The patient is status post appendectomy. The colon is partially filled with stool and is unremarkable in appearance. The bladder is significantly distended and grossly unremarkable. The patient is status post hysterectomy. No suspicious adnexal masses are seen. No inguinal lymphadenopathy is seen. No acute osseous abnormalities are identified. IMPRESSION: 1. Dilatation of small-bowel loops to 3.6 cm in maximal diameter, with fecalization at the mid to distal ileum just below the umbilicus, and a diffusely  thick-walled segment of distal ileum distal to the fecalization, compatible with acute infectious or inflammatory ileitis. Associated free fluid and mild mesenteric inflammation noted. More distal small bowel is decompressed. Findings compatible with partial obstruction due to ileitis. 2. Small amount of free fluid noted tracking about the liver and spleen. 3. Cholelithiasis.  Gallbladder otherwise unremarkable. 4. Small hiatal hernia seen. 5. Diffuse calcification along the abdominal aorta and its branches. Electronically Signed   By: Garald Balding M.D.   On: 06/15/2015 04:21    EKG: Independently reviewed.  Assessment/Plan Principal Problem:   Partial small bowel obstruction (HCC) Active Problems:   Diabetes mellitus without complication (HCC)   Ileitis   UTI (urinary tract infection)   1. Ileitis causing PSBO - 1. NPO 2. IVF 3. zofran PRN nausea 4. No vomiting yet so will try to get by without NGT initially 5. Morphine PRN pain 6. Treating presumed infectious ileitis with cipro/flagyl 2. UTI - 1. On cipro as above 2. Urine culture pending 3. NIDDM - 1. Hold metformin 2. Sensitive scale SSI q4h while NPO    Code Status: Full  Family Communication: No family in room Disposition Plan: Admit to obs  Time spent: 70 min  GARDNER, JARED M. [facility] Pager [phone]  If 7AM-7PM, please contact the day team taking care of the patient [email or web address] Password [facility] 06/15/2015, 6:39 AM

## 2015-06-15 NOTE — ED Notes (Signed)
Pt finished drinking her po contrast for CT  Family remains at bedside

## 2015-06-15 NOTE — ED Notes (Signed)
Patient complains of lower abdominal pain, stating that she cannot pass gas. Patient has tried an enema with some relief. Last bowel movement was on 12/14. Denies vomiting/diarrhea. Reports nausea.

## 2015-06-15 NOTE — Consult Note (Signed)
Referring Provider: [facility] Primary Care Physician:  Sallee Lange, MD Primary Gastroenterologist:  unassigned  Reason for Consultation:   Abdominal pain     HPI: [name] is a 81 y.o. female admitted through the emergency room early this morning with complaints of lower abdominal pain. She states she was feeling fine until yesterday afternoon. Earlier in the day, she had seen her orthopedist and had a Synvisc injection to her right knee. When she got home she states she felt very gassy and bloated. She has had admissions for abdominal pain in the past thought to possibly be due to adhesions  in the past, so she tried a fleets enema hoping she could pass some gas or have a small bowel movement, but nothing happened. She then walked around a lot to see if she could pass some gas but unfortunately was not able to do so. She began to feel nauseous but has not vomited. Around 12:30 this morning when her symptoms were not improving, she got dressed and brought herself to the emergency room. Has a history of a left colectomy due to diverticular disease in 2006 by Dr. Lucia Gaskins. She states she had a colonoscopy by him the year after that was nonrevealing. She reports that around that same time she had seen Dr. Kalman Shan for evaluation of left-sided crampy pain, but has not had to go back to see him since. When asked if she would prefer that I call Dr. Kalman Shan, she said she would like to see our group because she has not seen him in the past. Since the onset of this pain, she has not had any fever, chills, or night sweats. She has not had any dysuria. (She did have an admission in June 2016 with nausea, vomiting, abdominal discomfort, and increased urinary frequency. She was started on Rocephin for urinary tract infection and improved).  In the emergency room earlier today she had a CT scan of the abdomen and pelvis that showed dilatation of small bowel loops to 3.6 cm in maximal diameter, with  feculent sedation at the mid to distal ileum just below the umbilicus, and a diffusely thick-walled segment of distal ileum distal to the fecalization, compatible with acute infectious or inflammatory ileitis. Associated free fluid and mild mesenteric inflammation noted. More distal small bowel is decompressed. Findings compatible with partial obstruction due to ileitis. CBC revealed a white count of [id number] and metabolic panel revealed a BUN of 34 and a creatinine of 0.99. LFTs were normal. She states she feels much better with the antiemetics. We have discussed a possible NG tube but she is adamant that she prefers to hold off. Prior to this admission she states she had been moving her bowels on a daily basis with no bright red blood per rectum or melena.   Past Medical History  Diagnosis Date  . Hypertension   . Diabetes mellitus   . Hyperlipidemia   . Osteoarthritis   . History of hysterectomy   . Diverticulitis     multilple surgeries  . Hypertension 02/13/2011  . Gallstones     Past Surgical History  Procedure Laterality Date  . Appendectomy    . Breast reduction surgery    . Knee surgery    . Refractive surgery    . Cardiac catheterization  06/01/2003    EF is between 60-65% -- Smooth and normal coronary arteries -- Normal left ventricular systolic function  -- We will continue with medical therapy for her hypertensio  .  Laparoscopic incisional / umbilical / ventral hernia repair  02/23/2007   . Colon surgery      left  . Abdominal hysterectomy    . Eye surgery  03/30/2009    cataract    Prior to Admission medications   Medication Sig Start Date End Date Taking? Authorizing Provider  ALPRAZolam Duanne Moron) 0.25 MG tablet TAKE ONE TABLET BY MOUTH AT BEDTIME 02/08/15  Yes Kathyrn Drown, MD  amLODipine (NORVASC) 10 MG tablet TAKE ONE TABLET BY MOUTH ONCE DAILY 06/12/15  Yes Kathyrn Drown, MD  aspirin 81 MG tablet Take 81 mg by mouth daily.     Yes Historical Provider, MD  beta  carotene w/minerals (OCUVITE) tablet Take 1 tablet by mouth daily.   Yes Historical Provider, MD  cyanocobalamin 2000 MCG tablet Take 2,000 mcg by mouth daily.   Yes Historical Provider, MD  gemfibrozil (LOPID) 600 MG tablet TAKE ONE TABLET BY MOUTH ONCE DAILY 06/12/15  Yes Kathyrn Drown, MD  losartan-hydrochlorothiazide (HYZAAR) 100-25 MG tablet TAKE ONE TABLET BY MOUTH ONCE DAILY 06/12/15  Yes Kathyrn Drown, MD  metFORMIN (GLUCOPHAGE) 1000 MG tablet TAKE ONE TABLET BY MOUTH TWICE DAILY **INCREASED  DOSE** 04/03/15  Yes Kathyrn Drown, MD  multivitamin [facility]) per tablet Take 1 tablet by mouth daily.     Yes Historical Provider, MD  polyvinyl alcohol (LIQUIFILM TEARS) 1.4 % ophthalmic solution Place 1 drop into both eyes 2 (two) times daily.   Yes Historical Provider, MD  Red Yeast Rice Extract (RED YEAST RICE PO) Take 1 capsule by mouth 2 (two) times daily.     Yes Historical Provider, MD    Current Facility-Administered Medications  Medication Dose Route Frequency Provider Last Rate Last Dose  . 0.9 %  sodium chloride infusion   Intravenous Continuous Etta Quill, DO      . ALPRAZolam Duanne Moron) tablet 0.25 mg  0.25 mg Oral QHS Etta Quill, DO      . amLODipine (NORVASC) tablet 10 mg  10 mg Oral Daily Etta Quill, DO   10 mg at 06/15/15 1005  . aspirin chewable tablet 81 mg  81 mg Oral Daily Etta Quill, DO   81 mg at 06/15/15 1006  . cefTRIAXone (ROCEPHIN) 2 g in dextrose 5 % 50 mL IVPB  2 g Intravenous Q24H Christine E Shade, RPH   2 g at 06/15/15 1007  . gemfibrozil (LOPID) tablet 600 mg  600 mg Oral Daily Etta Quill, DO   600 mg at 06/15/15 1006  . heparin injection 5,000 Units  5,000 Units Subcutaneous 3 times per day Etta Quill, DO   5,000 Units at 06/15/15 [id number]  . metroNIDAZOLE (FLAGYL) IVPB 500 mg  500 mg Intravenous Q8H Etta Quill, DO      . morphine 2 MG/ML injection 2-4 mg  2-4 mg Intravenous Q4H PRN Etta Quill, DO      . multivitamin with  minerals tablet 1 tablet  1 tablet Oral Daily Etta Quill, DO   1 tablet at 06/15/15 1007  . ondansetron (ZOFRAN) injection 4 mg  4 mg Intravenous Q6H PRN Etta Quill, DO        Allergies as of 06/15/2015 - Review Complete 06/15/2015  Allergen Reaction Noted  . Phenergan [promethazine hcl]  08/29/2012  . Prednisone  02/15/2011  . Statins  08/29/2012    Family History  Problem Relation Age of Onset  . Cancer Mother  ovaian  . Hypertension Father     kidney  . Stroke Father   . Cancer Father   . Diabetes Brother   . Hypertension Sister     Social History   Social History  . Marital Status: Widowed    Spouse Name: N/A  . Number of Children: N/A  . Years of Education: N/A   Occupational History  . Retired Therapist, sports   Social History Main Topics  . Smoking status: Never Smoker   . Smokeless tobacco: Never Used  . Alcohol Use: No  . Drug Use: No   Review of Systems: Gen: Denies any fever, chills, sweats, anorexia, fatigue, weakness, malaise, weight loss, and sleep disorder CV: Denies chest pain, angina, palpitations, syncope, orthopnea, PND, peripheral edema, and claudication. Resp: Denies dyspnea at rest, dyspnea with exercise, cough, sputum, wheezing, coughing up blood, and pleurisy. GI: Admits to abdominal pain and nausea GU : Denies urinary burning, blood in urine, urinary frequency, urinary hesitancy, nocturnal urination, and urinary incontinence. MS: Admits to chronic right knee pain for which she has been having Synvisc injections Derm: Denies rash, itching, dry skin, hives, moles, warts, or unhealing ulcers.  Psych: Denies depression, anxiety, memory loss, suicidal ideation, hallucinations, paranoia, and confusion. Heme: Denies bruising, bleeding, and enlarged lymph nodes. Neuro:  Denies any headaches, dizziness, paresthesias. Endo:  Denies any problems with DM, thyroid, adrenal function.  Physical Exam: Vital signs in last 24 hours: Temp:  [97.9 F (36.6  C)-98.1 F (36.7 C)] 98.1 F (36.7 C) (12/15 0545) Pulse Rate:  [66-83] 66 (12/15 0545) Resp:  [18-20] 18 (12/15 0545) BP: (125-168)/(48-71) 125/57 mmHg (12/15 1005) SpO2:  [97 %] 97 % (12/15 0545) Weight:  [164 lb (74.39 kg)-169 lb 1.5 oz (76.7 kg)] 169 lb 1.5 oz (76.7 kg) (12/15 0545) Last BM Date: 06/13/15 General:   Alert,  Well-developed, well-nourished, pleasant and cooperative in NAD Head:  Normocephalic and atraumatic. Eyes:  Sclera clear, no icterus.   Conjunctiva pink. Ears:  Normal auditory acuity. Nose:  No deformity, discharge,  or lesions. Mouth:  No deformity or lesions.   Neck:  Supple; no masses or thyromegaly. Lungs:  Clear throughout to auscultation.   No wheezes, crackles, or rhonchi.  Heart:  Regular rate and rhythm; no murmurs, clicks, rubs,  or gallops. Abdomen:  Soft, mild tenderness to palpation diffusely, mild distention, hypoactive bowel sounds  Rectal:  Deferred  Msk:  Symmetrical without gross deformities. . Pulses:  Normal pulses noted. Extremities:  Without clubbing or edema. Neurologic: Alert and  oriented x4;  grossly normal neurologically. Skin:  Intact without significant lesions or rashes.. Psych: Alert and cooperative. Normal mood and affect.   Lab Results:  Recent Labs  06/15/15 0151  WBC 13.8*  HGB 14.0  HCT 42.1  PLT 341   BMET  Recent Labs  06/15/15 0151  NA 137  K 4.0  CL 100*  CO2 24  GLUCOSE 187*  BUN 34*  CREATININE 0.99  CALCIUM 10.3   LFT  Recent Labs  06/15/15 0151  PROT 8.1  ALBUMIN 4.8  AST 24  ALT 19  ALKPHOS 101  BILITOT 0.9   Studies/Results: Ct Abdomen Pelvis W Contrast  06/15/2015  CLINICAL DATA:  Acute onset of lower abdominal pain and leukocytosis. Initial encounter. EXAM: CT ABDOMEN AND PELVIS WITH CONTRAST TECHNIQUE: Multidetector CT imaging of the abdomen and pelvis was performed using the standard protocol following bolus administration of intravenous contrast. CONTRAST:  155mL OMNIPAQUE  IOHEXOL 300 MG/ML  SOLN  COMPARISON:  CT of the abdomen and pelvis from 12/15/2014 FINDINGS: Minimal bibasilar atelectasis is noted. A small hiatal hernia is noted. A small amount of free fluid is noted tracking about the liver and spleen, likely reflecting the acute ileal process. The liver and spleen are unremarkable in appearance. Stones are noted dependently within the gallbladder. The gallbladder is otherwise unremarkable. The pancreas and adrenal glands are unremarkable. The kidneys are unremarkable in appearance. There is no evidence of hydronephrosis. No renal or ureteral stones are seen. No perinephric stranding is appreciated. There is dilatation of small bowel loops up to 3.6 cm in maximal diameter, with fecalization noted at the mid to distal ileum at the mid abdomen, just below the umbilicus, and a diffusely thick walled segment of distal ileum distal to the fecalization, compatible with acute infectious or inflammatory ileitis. Associated free fluid and mild mesenteric inflammation are seen. More distal small bowel is decompressed. This reflects partial obstruction due to the ileitis. No free fluid is identified. The small bowel is unremarkable in appearance. The stomach is within normal limits. No acute vascular abnormalities are seen. Diffuse calcification is noted along the abdominal aorta and its branches. The patient is status post appendectomy. The colon is partially filled with stool and is unremarkable in appearance. The bladder is significantly distended and grossly unremarkable. The patient is status post hysterectomy. No suspicious adnexal masses are seen. No inguinal lymphadenopathy is seen. No acute osseous abnormalities are identified. IMPRESSION: 1. Dilatation of small-bowel loops to 3.6 cm in maximal diameter, with fecalization at the mid to distal ileum just below the umbilicus, and a diffusely thick-walled segment of distal ileum distal to the fecalization, compatible with acute  infectious or inflammatory ileitis. Associated free fluid and mild mesenteric inflammation noted. More distal small bowel is decompressed. Findings compatible with partial obstruction due to ileitis. 2. Small amount of free fluid noted tracking about the liver and spleen. 3. Cholelithiasis.  Gallbladder otherwise unremarkable. 4. Small hiatal hernia seen. 5. Diffuse calcification along the abdominal aorta and its branches. Electronically Signed   By: Garald Balding M.D.   On: 06/15/2015 04:21    IMPRESSION/PLAN:  81 year old female status post hemicolectomy years ago for diverticular disease, admitted with partial small bowel obstruction? Due to adhesions or inflammation/enteritis. Would keep nothing by mouth. Continue IV fluids. Antiemetics around the clock. Pain meds as needed, continue antibiotics. Trend CBC. Will follow. Abdominal films in morning.  UTI Non-insulin-dependent diabetes   Hvozdovic, Vita Barley PA-C 06/15/2015,  Pager [phone]  Mon-Fri 8a-5p [phone] after 5p, weekends, holidays    [facility] Attending   I have taken an interval history, reviewed the chart and examined the patient. I agree with the Advanced Practitioner's note, impression and recommendations.   Despite the radiologists impressions of an ileitis I favor adhesion causing pSBO. Would continue current Rx I have told her I think NG tube decompression makes sense if not any better tomorrow. She's hoping and praying she won't need that.  Gatha Mayer, MD, [facility] [phone] (pager) [phone] after 5 PM, weekends and holidays  06/15/2015 4:52 PM

## 2015-06-15 NOTE — Plan of Care (Signed)
[facility] PLAN OF CARE NOTE  Patient: [name]   [id number]  PCP: Sallee Lange, MD   DOB: 03/25/1932  DOA: 06/15/2015   DOS: 06/15/2015   Plan of care: Patient admitted last night by my colleague Dr. Alcario Drought. Presented with sudden onset of abdominal pain as well as obstipation. CT shows ileitis. Discuss with GI who will follow-up with the patient. Antibiotic changed from Cipro to ceftriaxone due to resistant to Escherichia coli. Discussed with surgery recommend continue observation and if not getting better reconsult in the morning. We'll recheck CBC and lactic acid level.  Author: Berle Mull, MD [facility] Pager: [phone] 06/15/2015 6:12 PM   If 7PM-7AM, please contact night-coverage at [email or web address], password [facility]

## 2015-06-15 NOTE — Progress Notes (Signed)
ANTIBIOTIC CONSULT NOTE - INITIAL  Pharmacy Consult for Ceftriaxone Indication: Intra-abdominal infection  Allergies  Allergen Reactions  . Phenergan [Promethazine Hcl]     hallucinations  . Prednisone     rash  . Statins     Severe myalgias    Patient Measurements: Height: 5\' 8"  (172.7 cm) Weight: 169 lb 1.5 oz (76.7 kg) IBW/kg (Calculated) : 63.9  Vital Signs: Temp: 98.1 F (36.7 C) (12/15 0545) Temp Source: Oral (12/15 0545) BP: 149/48 mmHg (12/15 0545) Pulse Rate: 66 (12/15 0545) Intake/Output from previous day:    Labs:  Recent Labs  06/15/15 0151  WBC 13.8*  HGB 14.0  PLT 341  CREATININE 0.99   Estimated Creatinine Clearance: 48.5 mL/min (by C-G formula based on Cr of 0.99).  Microbiology: No results found for this or any previous visit (from the past 720 hour(s)).  Medical History: Past Medical History  Diagnosis Date  . Hypertension   . Diabetes mellitus   . Hyperlipidemia   . Osteoarthritis   . History of hysterectomy   . Diverticulitis     multilple surgeries  . Hypertension 02/13/2011  . Gallstones     Assessment: 56 yoF admitted on 12/15 with c/o abdominal pain.  PMH includes HTN, DM, diverticulitis, etc.  CT shows acute infectious or inflammatory ileitis and partial obstruction due to ileitis.  She was initially started on cipro/flagyl, but now pharmacy is consulted to dose ceftriaxone for suspected intra-abdominal infection.  Today, 06/15/2015: Afebrile WBC 13.8 SCr 0.99  Antimicrobials this admission: 12/15 >> Cipro >> 12/15 12/15 >> Metronidazole >> 12/15 >> Ceftriaxone >>  Levels/dose changes this admission:  Microbiology Results: 12/15 UCx: ordered    Goal of Therapy:  Appropriate abx dosing, eradication of infection.   Plan:   Ceftriaxone 2g IV q24h  Follow up renal fxn, culture results, and clinical course.  Gretta Arab PharmD, BCPS Pager [phone] 06/15/2015 9:03 AM

## 2015-06-15 NOTE — ED Notes (Signed)
Returned from CT.

## 2015-06-15 NOTE — ED Provider Notes (Signed)
By signing my name below, I, Emmanuella Mensah, attest that this documentation has been prepared under the direction and in the presence of Rolland Porter, MD. Electronically Signed: Judithann Sauger, ED Scribe. 06/15/2015. 1:56 AM.   HPI Comments: [name] is a 81 y.o. female who presents to the Emergency Department complaining of gradually worsening lower abdominal pain onset yesterday afternoon. She explains her symptoms as " being gassy". Pt reports associated nausea. She denies vomiting. She states that she tried an enema PTA with mild relief. She reports that she had these symptoms last year and she was septic at that time.   PE:  Pt is well developed, well nourished and cooperative Holding abdomen, skin on face is flush Abdomen appears distended and feels distended and a little tight. Decreased bowel sounds.  Tenderness in right abdomen diffusely.    1:54 AM- Pt will receive a CAT scan for further evaluation.     Medical screening examination/treatment/procedure(s) were conducted as a shared visit with non-physician practitioner(s) and myself.  I personally evaluated the patient during the encounter.   EKG Interpretation None       Rolland Porter, MD, Barbette Or, MD 06/15/15 [phone]

## 2015-06-15 NOTE — ED Provider Notes (Signed)
CSN: [id number]     Arrival date & time 06/15/15  0124 History   First MD Initiated Contact with Patient 06/15/15 0131     Chief Complaint  Patient presents with  . Abdominal Pain     (Consider location/radiation/quality/duration/timing/severity/associated sxs/prior Treatment) HPI Comments: 81 year old female with a history of hypertension, diabetes mellitus, dyslipidemia, diverticulitis, and hypertension presents to the emergency department for evaluation of abdominal pain. Patient reports generalized abdominal pain which began yesterday afternoon. Patient reports that the pain is aching and sharp. She has tried a Radiation protection practitioner enema without relief. She reports that she last passed flatus yesterday morning after having a bowel movement. Bowel movement was normal for her and free of blood. She reports worsening nausea without emesis. She has had no fever, shortness of breath, dysuria, or hematuria. She denies a history of small bowel obstruction, but has had multiple abdominal surgeries including a hemicolectomy for diverticular disease, appendectomy, and abdominal hysterectomy.  Patient is a 81 y.o. female presenting with abdominal pain. The history is provided by the patient. No language interpreter was used.  Abdominal Pain Associated symptoms: nausea   Associated symptoms: no chest pain, no dysuria, no fever, no hematuria, no shortness of breath and no vomiting     Past Medical History  Diagnosis Date  . Hypertension   . Diabetes mellitus   . Hyperlipidemia   . Osteoarthritis   . History of hysterectomy   . Diverticulitis     multilple surgeries  . Hypertension 02/13/2011  . Gallstones    Past Surgical History  Procedure Laterality Date  . Appendectomy    . Breast reduction surgery    . Knee surgery    . Refractive surgery    . Cardiac catheterization  06/01/2003    EF is between 60-65% -- Smooth and normal coronary arteries -- Normal left ventricular systolic function  -- We will  continue with medical therapy for her hypertensio  . Laparoscopic incisional / umbilical / ventral hernia repair  02/23/2007   . Colon surgery      left  . Abdominal hysterectomy    . Eye surgery  03/30/2009    cataract   Family History  Problem Relation Age of Onset  . Cancer Mother     ovaian  . Hypertension Father     kidney  . Stroke Father   . Cancer Father   . Diabetes Brother   . Hypertension Sister    Social History  Substance Use Topics  . Smoking status: Never Smoker   . Smokeless tobacco: Never Used  . Alcohol Use: No   OB History    No data available      Review of Systems  Constitutional: Negative for fever.  Respiratory: Negative for shortness of breath.   Cardiovascular: Negative for chest pain.  Gastrointestinal: Positive for nausea and abdominal pain. Negative for vomiting.  Genitourinary: Negative for dysuria and hematuria.  All other systems reviewed and are negative.   Allergies  Phenergan; Prednisone; and Statins  Home Medications   Prior to Admission medications   Medication Sig Start Date End Date Taking? Authorizing Provider  ALPRAZolam Duanne Moron) 0.25 MG tablet TAKE ONE TABLET BY MOUTH AT BEDTIME 02/08/15  Yes Kathyrn Drown, MD  amLODipine (NORVASC) 10 MG tablet TAKE ONE TABLET BY MOUTH ONCE DAILY 06/12/15  Yes Kathyrn Drown, MD  aspirin 81 MG tablet Take 81 mg by mouth daily.     Yes Historical Provider, MD  beta carotene w/minerals (OCUVITE) tablet  Take 1 tablet by mouth daily.   Yes Historical Provider, MD  cyanocobalamin 2000 MCG tablet Take 2,000 mcg by mouth daily.   Yes Historical Provider, MD  gemfibrozil (LOPID) 600 MG tablet TAKE ONE TABLET BY MOUTH ONCE DAILY 06/12/15  Yes Kathyrn Drown, MD  losartan-hydrochlorothiazide (HYZAAR) 100-25 MG tablet TAKE ONE TABLET BY MOUTH ONCE DAILY 06/12/15  Yes Kathyrn Drown, MD  metFORMIN (GLUCOPHAGE) 1000 MG tablet TAKE ONE TABLET BY MOUTH TWICE DAILY **INCREASED  DOSE** 04/03/15  Yes Kathyrn Drown, MD  multivitamin [facility]) per tablet Take 1 tablet by mouth daily.     Yes Historical Provider, MD  polyvinyl alcohol (LIQUIFILM TEARS) 1.4 % ophthalmic solution Place 1 drop into both eyes 2 (two) times daily.   Yes Historical Provider, MD  Red Yeast Rice Extract (RED YEAST RICE PO) Take 1 capsule by mouth 2 (two) times daily.     Yes Historical Provider, MD   BP 168/71 mmHg  Pulse 83  Temp(Src) 97.9 F (36.6 C) (Oral)  Resp 20  Ht 5\' 8"  (1.727 m)  Wt 74.39 kg  BMI 24.94 kg/m2  SpO2 97%   Physical Exam  Constitutional: She is oriented to person, place, and time. She appears well-developed and well-nourished. No distress.  Pleasant and well appearing  HENT:  Head: Normocephalic and atraumatic.  Eyes: Conjunctivae and EOM are normal. No scleral icterus.  Neck: Normal range of motion.  Cardiovascular: Normal rate, regular rhythm and intact distal pulses.   Pulmonary/Chest: Effort normal. No respiratory distress. She has no wheezes.  Respirations even and unlabored  Abdominal: She exhibits distension (mild). There is tenderness. There is no rebound and no guarding.  Abdomen mildly distended. There is generalized TTP. No masses or involuntary guarding. BS heard in all quadrants.  Musculoskeletal: Normal range of motion.  Neurological: She is alert and oriented to person, place, and time. She exhibits normal muscle tone. Coordination normal.  GCS 15. Patient moving all extremities  Skin: Skin is warm and dry. No rash noted. She is not diaphoretic. No erythema. No pallor.  Psychiatric: She has a normal mood and affect. Her behavior is normal.  Nursing note and vitals reviewed.   ED Course  Procedures (including critical care time) Labs Review Labs Reviewed  CBC WITH DIFFERENTIAL/PLATELET - Abnormal; Notable for the following:    WBC 13.8 (*)    Neutro Abs 11.3 (*)    All other components within normal limits  COMPREHENSIVE METABOLIC PANEL - Abnormal; Notable for the  following:    Chloride 100 (*)    Glucose, Bld 187 (*)    BUN 34 (*)    GFR calc non Af Amer 52 (*)    All other components within normal limits  CBG MONITORING, ED - Abnormal; Notable for the following:    Glucose-Capillary 171 (*)    All other components within normal limits  I-STAT CG4 LACTIC ACID, ED - Abnormal; Notable for the following:    Lactic Acid, Venous 2.25 (*)    All other components within normal limits  LIPASE, BLOOD  URINALYSIS, ROUTINE W REFLEX MICROSCOPIC (NOT AT [facility])    Imaging Review Ct Abdomen Pelvis W Contrast  06/15/2015  CLINICAL DATA:  Acute onset of lower abdominal pain and leukocytosis. Initial encounter. EXAM: CT ABDOMEN AND PELVIS WITH CONTRAST TECHNIQUE: Multidetector CT imaging of the abdomen and pelvis was performed using the standard protocol following bolus administration of intravenous contrast. CONTRAST:  178mL OMNIPAQUE IOHEXOL 300 MG/ML  SOLN COMPARISON:  CT of the abdomen and pelvis from 12/15/2014 FINDINGS: Minimal bibasilar atelectasis is noted. A small hiatal hernia is noted. A small amount of free fluid is noted tracking about the liver and spleen, likely reflecting the acute ileal process. The liver and spleen are unremarkable in appearance. Stones are noted dependently within the gallbladder. The gallbladder is otherwise unremarkable. The pancreas and adrenal glands are unremarkable. The kidneys are unremarkable in appearance. There is no evidence of hydronephrosis. No renal or ureteral stones are seen. No perinephric stranding is appreciated. There is dilatation of small bowel loops up to 3.6 cm in maximal diameter, with fecalization noted at the mid to distal ileum at the mid abdomen, just below the umbilicus, and a diffusely thick walled segment of distal ileum distal to the fecalization, compatible with acute infectious or inflammatory ileitis. Associated free fluid and mild mesenteric inflammation are seen. More distal small bowel is  decompressed. This reflects partial obstruction due to the ileitis. No free fluid is identified. The small bowel is unremarkable in appearance. The stomach is within normal limits. No acute vascular abnormalities are seen. Diffuse calcification is noted along the abdominal aorta and its branches. The patient is status post appendectomy. The colon is partially filled with stool and is unremarkable in appearance. The bladder is significantly distended and grossly unremarkable. The patient is status post hysterectomy. No suspicious adnexal masses are seen. No inguinal lymphadenopathy is seen. No acute osseous abnormalities are identified. IMPRESSION: 1. Dilatation of small-bowel loops to 3.6 cm in maximal diameter, with fecalization at the mid to distal ileum just below the umbilicus, and a diffusely thick-walled segment of distal ileum distal to the fecalization, compatible with acute infectious or inflammatory ileitis. Associated free fluid and mild mesenteric inflammation noted. More distal small bowel is decompressed. Findings compatible with partial obstruction due to ileitis. 2. Small amount of free fluid noted tracking about the liver and spleen. 3. Cholelithiasis.  Gallbladder otherwise unremarkable. 4. Small hiatal hernia seen. 5. Diffuse calcification along the abdominal aorta and its branches. Electronically Signed   By: Garald Balding M.D.   On: 06/15/2015 04:21     I have personally reviewed and evaluated these images and lab results as part of my medical decision-making.   EKG Interpretation None      MDM   Final diagnoses:  Ileitis  Partial bowel obstruction (HCC)  Generalized abdominal pain    81 year old female presents to the emergency department for evaluation of abdominal pain. She reports not passing flatus since yesterday morning. Laboratory workup notable for a leukocytosis of 13.8. Patient also has a lactate of 2.25. CT scan obtained to evaluate for small bowel obstruction. CT  shows dilatation of small bowel loops to 3.6 cm as well as the close a patient in the mid to distal ileum with associated diffuse wall thickening of the distal ileum. Findings consistent with ileitis and associated partial obstruction.  Patient started on ciprofloxacin and Flagyl. Will admit to [facility] under observation for further management. Patient requesting additional morphine for pain control which has been ordered. Case discussed with Dr. Alcario Drought who will admit.   Filed Vitals:   06/15/15 0131 06/15/15 0132  BP:  168/71  Pulse:  83  Temp:  97.9 F (36.6 C)  TempSrc:  Oral  Resp:  20  Height: 5\' 8"  (1.727 m)   Weight: 74.39 kg   SpO2:  97%     Antonietta Breach, PA-C 06/15/15 0451  Rolland Porter, MD 06/15/15 [phone]

## 2015-06-15 NOTE — Progress Notes (Signed)
ANTIBIOTIC CONSULT NOTE - INITIAL  Pharmacy Consult for Cipro Indication: Intra-abdominal infection  Allergies  Allergen Reactions  . Phenergan [Promethazine Hcl]     hallucinations  . Prednisone     rash  . Statins     Severe myalgias    Patient Measurements: Height: 5\' 8"  (172.7 cm) Weight: 164 lb (74.39 kg) IBW/kg (Calculated) : 63.9   Vital Signs: Temp: 97.9 F (36.6 C) (12/15 0132) Temp Source: Oral (12/15 0132) BP: 168/71 mmHg (12/15 0132) Pulse Rate: 83 (12/15 0132) Intake/Output from previous day:   Intake/Output from this shift:    Labs:  Recent Labs  06/15/15 0151  WBC 13.8*  HGB 14.0  PLT 341  CREATININE 0.99   Estimated Creatinine Clearance: 45 mL/min (by C-G formula based on Cr of 0.99). No results for input(s): VANCOTROUGH, VANCOPEAK, VANCORANDOM, GENTTROUGH, GENTPEAK, GENTRANDOM, TOBRATROUGH, TOBRAPEAK, TOBRARND, AMIKACINPEAK, AMIKACINTROU, AMIKACIN in the last 72 hours.   Microbiology: No results found for this or any previous visit (from the past 720 hour(s)).  Medical History: Past Medical History  Diagnosis Date  . Hypertension   . Diabetes mellitus   . Hyperlipidemia   . Osteoarthritis   . History of hysterectomy   . Diverticulitis     multilple surgeries  . Hypertension 02/13/2011  . Gallstones     Medications:   (Not in a hospital admission) Scheduled:   Infusions:  . ciprofloxacin    . metronidazole     Assessment: 64 yoF c/o lower abdominal pain.  Cipro per Rx for intra-abdominal infection.   Goal of Therapy:  Treat infection  Plan:   Cipro 400mg  IV q12h  F/u Scr/cultures  Lawana Pai R 06/15/2015,4:39 AM

## 2015-06-15 NOTE — ED Notes (Signed)
Lactic acid= 2.25, PA Claiborne Billings notified

## 2015-06-16 ENCOUNTER — Inpatient Hospital Stay (HOSPITAL_COMMUNITY): Payer: Medicare Other

## 2015-06-16 LAB — BASIC METABOLIC PANEL
Anion gap: 9 (ref 5–15)
BUN: 22 mg/dL — AB (ref 6–20)
CHLORIDE: 105 mmol/L (ref 101–111)
CO2: 27 mmol/L (ref 22–32)
Calcium: 8.4 mg/dL — ABNORMAL LOW (ref 8.9–10.3)
Creatinine, Ser: 0.89 mg/dL (ref 0.44–1.00)
GFR calc Af Amer: >60 mL/min (ref >60)
GFR calc non Af Amer: 59 mL/min — ABNORMAL LOW (ref >60)
GLUCOSE: 121 mg/dL — AB (ref 65–99)
POTASSIUM: 3.7 mmol/L (ref 3.5–5.1)
Sodium: 141 mmol/L (ref 135–145)

## 2015-06-16 LAB — CBC WITH DIFFERENTIAL/PLATELET
Basophils Absolute: 0 10*3/uL (ref 0.0–0.1)
Basophils Relative: 0 %
EOS PCT: 4 %
Eosinophils Absolute: 0.3 10*3/uL (ref 0.0–0.7)
HEMATOCRIT: 33.3 % — AB (ref 36.0–46.0)
HEMOGLOBIN: 11 g/dL — AB (ref 12.0–15.0)
LYMPHS ABS: 1.7 10*3/uL (ref 0.7–4.0)
LYMPHS PCT: 26 %
MCH: 31.1 pg (ref 26.0–34.0)
MCHC: 33 g/dL (ref 30.0–36.0)
MCV: 94.1 fL (ref 78.0–100.0)
Monocytes Absolute: 0.5 10*3/uL (ref 0.1–1.0)
Monocytes Relative: 8 %
NEUTROS ABS: 3.9 10*3/uL (ref 1.7–7.7)
Neutrophils Relative %: 62 %
PLATELETS: 255 10*3/uL (ref 150–400)
RBC: 3.54 MIL/uL — AB (ref 3.87–5.11)
RDW: 12.9 % (ref 11.5–15.5)
WBC: 6.3 10*3/uL (ref 4.0–10.5)

## 2015-06-16 NOTE — Progress Notes (Signed)
   Patient Name: [name] Date of Encounter: 06/16/2015, 11:04 AM    Subjective  Better but still sore- Has had flatus but no stool   Objective  BP 145/45 mmHg  Pulse 73  Temp(Src) 98 F (36.7 C) (Oral)  Resp 15  Ht 5\' 8"  (1.727 m)  Wt 169 lb 1.5 oz (76.7 kg)  BMI 25.72 kg/m2  SpO2 94% abd is less distended, soft, mild-mod RLQ tenderness still w/ some guardimg BS + - rushes Alert and oriented  abd films - no obstruction or sb dilation, contrast is in the colon  WBC 6.3 down from 12 BMET NL Hgb11    Assessment and Plan  Partial SBO - improving - clear liquids Will see tomorrow   Gatha Mayer, MD, [facility] [phone]) [phone] after 5 PM, weekends and holidays  06/16/2015 11:04 AM

## 2015-06-16 NOTE — Progress Notes (Signed)
PHARMACY NOTE -  Rocephin for Ecoli UTI  Pharmacy has been assisting with dosing of Rocephin for UTI, ileitis. Dosage remains stable at 2gm IV q24h and need for further dosage adjustment appears unlikely at present.    Will sign off at this time.  Please reconsult if a change in clinical status warrants re-evaluation of dosage.  Netta Cedars, PharmD, BCPS Pager: [phone] 06/16/2015@2 :00 PM

## 2015-06-16 NOTE — Progress Notes (Addendum)
Patient Demographics  [name], is a 81 y.o. female, DOB - [birth year], [id number]  Admit date - 06/15/2015   Admitting Physician Etta Quill, DO  Outpatient Primary MD for the patient is Sallee Lange, MD  LOS - 1   Chief Complaint  Patient presents with  . Abdominal Pain       Admission HPI/Brief narrative: 81 y.o. female with h/o HTN, DM, diverticulitis. Patient presents to the ED with c/o abdominal pain, and nausea, no vomiting, CT abdomen pelvis significant for partial obstruction versus ileitis, symptoms continues to improve, started on clear liquid diet.  Subjective:   [name] today has, No headache, No chest pain, No Nausea, No new weakness tingling or numbness, No Cough - SOB, passing flatus, small BM overnight, abdominal pain significantly subsided.  Assessment & Plan    Principal Problem:   Partial small bowel obstruction (HCC) Active Problems:   Diabetes mellitus without complication (HCC)   Ileitis   UTI (urinary tract infection)   Ileitis, regional, with intestinal obstruction   Abnormal CT scan, gastrointestinal tract  Partial small bowel obstruction versus ileitis -GI consult greatly appreciated, symptoms are more likely related to partial SBO , unlikely ileitis - No further nausea or vomiting, abdominal pain improved, passing flatus, repeat abdominal x-ray this a.m. showing normal bowel gas pattern. - We'll start on clear liquid diet, will advance to full liquids by the end of the day of tolerating. - on  Rocephin and Flagyl for possible ileitis, will discontinue Flagyl in a.m. if continues to improve.  UTI - Continue with Rocephin, culture growing Escherichia coli, follow on sensitivity  Diabetes mellitus - Continue to hold metformin, continue with insulin sliding scale.  Hypertension - Continue with amlodipine Code Status: Full  Family  Communication: None at bedside  Disposition Plan: Home when stable.   Procedures  None   Consults   Gastroenterology   Medications  Scheduled Meds: . ALPRAZolam  0.25 mg Oral QHS  . amLODipine  10 mg Oral Daily  . aspirin  81 mg Oral Daily  . cefTRIAXone (ROCEPHIN)  IV  2 g Intravenous Q24H  . gemfibrozil  600 mg Oral Daily  . heparin  5,000 Units Subcutaneous 3 times per day  . metronidazole  500 mg Intravenous Q8H  . multivitamin with minerals  1 tablet Oral Daily  . ondansetron (ZOFRAN) IV  4 mg Intravenous Q6H   Continuous Infusions: . sodium chloride 125 mL/hr at 06/16/15 0045   PRN Meds:.lip balm, morphine injection  DVT Prophylaxis   Heparin   Lab Results  Component Value Date   PLT 255 06/16/2015    Antibiotics    Anti-infectives    Start     Dose/Rate Route Frequency Ordered Stop   06/15/15 1300  metroNIDAZOLE (FLAGYL) IVPB 500 mg     500 mg 100 mL/hr over 60 Minutes Intravenous Every 8 hours 06/15/15 0448     06/15/15 1000  cefTRIAXone (ROCEPHIN) 2 g in dextrose 5 % 50 mL IVPB     2 g 100 mL/hr over 30 Minutes Intravenous Every 24 hours 06/15/15 0908     06/15/15 0500  ciprofloxacin (CIPRO) IVPB 400 mg  Status:  Discontinued     400 mg 200 mL/hr  over 60 Minutes Intravenous Every 12 hours 06/15/15 0437 06/15/15 0859   06/15/15 0445  metroNIDAZOLE (FLAGYL) IVPB 500 mg     500 mg 100 mL/hr over 60 Minutes Intravenous  Once 06/15/15 0432 06/15/15 0553          Objective:   Filed Vitals:   06/15/15 1005 06/15/15 1342 06/15/15 2130 06/16/15 0613  BP: 125/57 126/41 137/70 145/45  Pulse:  67 68 73  Temp:  98.1 F (36.7 C) 98 F (36.7 C) 98 F (36.7 C)  TempSrc:  Oral Oral Oral  Resp:  18 18 15   Height:      Weight:      SpO2:  94% 95% 94%    Wt Readings from Last 3 Encounters:  06/15/15 76.7 kg (169 lb 1.5 oz)  05/18/15 76.658 kg (169 lb)  02/08/15 77.656 kg (171 lb 3.2 oz)     Intake/Output Summary (Last 24 hours) at 06/16/15  1202 Last data filed at 06/16/15 1006  Gross per 24 hour  Intake 2612.5 ml  Output   2200 ml  Net  412.5 ml     Physical Exam  Awake Alert, Oriented X 3, No new F.N deficits, Normal affect [place].AT,PERRAL Supple Neck,No JVD, No cervical lymphadenopathy appriciated.  Symmetrical Chest wall movement, Good air movement bilaterally, CTAB RRR,No Gallops,Rubs or new Murmurs, No Parasternal Heave +ve B.Sounds, Abd Soft, mild epigastric tenderness, No organomegaly appriciated, No rebound - guarding or rigidity. No Cyanosis, Clubbing or edema, No new Rash or bruise    Data Review   Micro Results Recent Results (from the past 240 hour(s))  Culture, Urine     Status: None (Preliminary result)   Collection Time: 06/15/15 10:31 AM  Result Value Ref Range Status   Specimen Description URINE, CLEAN CATCH  Final   Special Requests NONE  Final   Culture   Final    >=100,000 COLONIES/mL ESCHERICHIA COLI Performed at [facility]    Report Status PENDING  Incomplete    Radiology Reports Ct Abdomen Pelvis W Contrast  06/15/2015  CLINICAL DATA:  Acute onset of lower abdominal pain and leukocytosis. Initial encounter. EXAM: CT ABDOMEN AND PELVIS WITH CONTRAST TECHNIQUE: Multidetector CT imaging of the abdomen and pelvis was performed using the standard protocol following bolus administration of intravenous contrast. CONTRAST:  1104mL OMNIPAQUE IOHEXOL 300 MG/ML  SOLN COMPARISON:  CT of the abdomen and pelvis from 12/15/2014 FINDINGS: Minimal bibasilar atelectasis is noted. A small hiatal hernia is noted. A small amount of free fluid is noted tracking about the liver and spleen, likely reflecting the acute ileal process. The liver and spleen are unremarkable in appearance. Stones are noted dependently within the gallbladder. The gallbladder is otherwise unremarkable. The pancreas and adrenal glands are unremarkable. The kidneys are unremarkable in appearance. There is no evidence of  hydronephrosis. No renal or ureteral stones are seen. No perinephric stranding is appreciated. There is dilatation of small bowel loops up to 3.6 cm in maximal diameter, with fecalization noted at the mid to distal ileum at the mid abdomen, just below the umbilicus, and a diffusely thick walled segment of distal ileum distal to the fecalization, compatible with acute infectious or inflammatory ileitis. Associated free fluid and mild mesenteric inflammation are seen. More distal small bowel is decompressed. This reflects partial obstruction due to the ileitis. No free fluid is identified. The small bowel is unremarkable in appearance. The stomach is within normal limits. No acute vascular abnormalities are seen. Diffuse calcification is  noted along the abdominal aorta and its branches. The patient is status post appendectomy. The colon is partially filled with stool and is unremarkable in appearance. The bladder is significantly distended and grossly unremarkable. The patient is status post hysterectomy. No suspicious adnexal masses are seen. No inguinal lymphadenopathy is seen. No acute osseous abnormalities are identified. IMPRESSION: 1. Dilatation of small-bowel loops to 3.6 cm in maximal diameter, with fecalization at the mid to distal ileum just below the umbilicus, and a diffusely thick-walled segment of distal ileum distal to the fecalization, compatible with acute infectious or inflammatory ileitis. Associated free fluid and mild mesenteric inflammation noted. More distal small bowel is decompressed. Findings compatible with partial obstruction due to ileitis. 2. Small amount of free fluid noted tracking about the liver and spleen. 3. Cholelithiasis.  Gallbladder otherwise unremarkable. 4. Small hiatal hernia seen. 5. Diffuse calcification along the abdominal aorta and its branches. Electronically Signed   By: Garald Balding M.D.   On: 06/15/2015 04:21   Dg Abd 2 Views  06/16/2015  CLINICAL DATA:   Generalized abdominal pain and distention. EXAM: ABDOMEN - 2 VIEW COMPARISON:  CT scan of June 15, 2015. FINDINGS: The bowel gas pattern is normal. There is no evidence of free air. Residual contrast is noted in the colon. Status post surgical anterior abdominal wall hernia repair. Cholelithiasis is noted. IMPRESSION: No evidence of bowel obstruction or ileus.  Cholelithiasis. Electronically Signed   By: Marijo Conception, M.D.   On: 06/16/2015 08:39     CBC  Recent Labs Lab 06/15/15 0151 06/15/15 1511 06/16/15 0415  WBC 13.8* 12.1* 6.3  HGB 14.0 12.2 11.0*  HCT 42.1 37.6 33.3*  PLT 341 294 255  MCV 91.7 94.0 94.1  MCH 30.5 30.5 31.1  MCHC 33.3 32.4 33.0  RDW 12.8 13.0 12.9  LYMPHSABS 1.7 1.5 1.7  MONOABS 0.7 0.9 0.5  EOSABS 0.2 0.3 0.3  BASOSABS 0.0 0.0 0.0    Chemistries   Recent Labs Lab 06/15/15 0151 06/16/15 0415  NA 137 141  K 4.0 3.7  CL 100* 105  CO2 24 27  GLUCOSE 187* 121*  BUN 34* 22*  CREATININE 0.99 0.89  CALCIUM 10.3 8.4*  AST 24  --   ALT 19  --   ALKPHOS 101  --   BILITOT 0.9  --    ------------------------------------------------------------------------------------------------------------------ estimated creatinine clearance is 54 mL/min (by C-G formula based on Cr of 0.89). ------------------------------------------------------------------------------------------------------------------ No results for input(s): HGBA1C in the last 72 hours. ------------------------------------------------------------------------------------------------------------------ No results for input(s): CHOL, HDL, LDLCALC, TRIG, CHOLHDL, LDLDIRECT in the last 72 hours. ------------------------------------------------------------------------------------------------------------------ No results for input(s): TSH, T4TOTAL, T3FREE, THYROIDAB in the last 72 hours.  Invalid input(s):  FREET3 ------------------------------------------------------------------------------------------------------------------ No results for input(s): VITAMINB12, FOLATE, FERRITIN, TIBC, IRON, RETICCTPCT in the last 72 hours.  Coagulation profile No results for input(s): INR, PROTIME in the last 168 hours.  No results for input(s): DDIMER in the last 72 hours.  Cardiac Enzymes No results for input(s): CKMB, TROPONINI, MYOGLOBIN in the last 168 hours.  Invalid input(s): CK ------------------------------------------------------------------------------------------------------------------ Invalid input(s): POCBNP     Time Spent in minutes   25 minutes   Roneisha Stern M.D on 06/16/2015 at 12:02 PM  Between 7am to 7pm - Pager - [phone]  After 7pm go to [email or web address] - password [facility]  [facility]   Office  [phone]

## 2015-06-17 ENCOUNTER — Inpatient Hospital Stay (HOSPITAL_COMMUNITY): Payer: Medicare Other

## 2015-06-17 DIAGNOSIS — D72829 Elevated white blood cell count, unspecified: Secondary | ICD-10-CM

## 2015-06-17 DIAGNOSIS — R112 Nausea with vomiting, unspecified: Secondary | ICD-10-CM

## 2015-06-17 DIAGNOSIS — B9620 Unspecified Escherichia coli [E. coli] as the cause of diseases classified elsewhere: Secondary | ICD-10-CM

## 2015-06-17 DIAGNOSIS — E1169 Type 2 diabetes mellitus with other specified complication: Secondary | ICD-10-CM

## 2015-06-17 DIAGNOSIS — E785 Hyperlipidemia, unspecified: Secondary | ICD-10-CM

## 2015-06-17 DIAGNOSIS — I10 Essential (primary) hypertension: Secondary | ICD-10-CM

## 2015-06-17 DIAGNOSIS — N390 Urinary tract infection, site not specified: Secondary | ICD-10-CM

## 2015-06-17 LAB — BASIC METABOLIC PANEL
Anion gap: 8 (ref 5–15)
BUN: 12 mg/dL (ref 6–20)
CHLORIDE: 106 mmol/L (ref 101–111)
CO2: 29 mmol/L (ref 22–32)
Calcium: 9.3 mg/dL (ref 8.9–10.3)
Creatinine, Ser: 0.85 mg/dL (ref 0.44–1.00)
GFR calc non Af Amer: >60 mL/min (ref >60)
Glucose, Bld: 154 mg/dL — ABNORMAL HIGH (ref 65–99)
POTASSIUM: 3.8 mmol/L (ref 3.5–5.1)
SODIUM: 143 mmol/L (ref 135–145)

## 2015-06-17 LAB — CBC
HCT: 35.7 % — ABNORMAL LOW (ref 36.0–46.0)
HEMOGLOBIN: 11.7 g/dL — AB (ref 12.0–15.0)
MCH: 30.6 pg (ref 26.0–34.0)
MCHC: 32.8 g/dL (ref 30.0–36.0)
MCV: 93.5 fL (ref 78.0–100.0)
Platelets: 273 10*3/uL (ref 150–400)
RBC: 3.82 MIL/uL — AB (ref 3.87–5.11)
RDW: 12.8 % (ref 11.5–15.5)
WBC: 5.4 10*3/uL (ref 4.0–10.5)

## 2015-06-17 LAB — URINE CULTURE: Culture: >=100000

## 2015-06-17 MED ORDER — OCUVITE-LUTEIN PO CAPS
1.0000 | ORAL_CAPSULE | Freq: Every day | ORAL | Status: DC
  Administered 2015-06-17 – 2015-06-21 (×5): 1 via ORAL
  Filled 2015-06-17 (×5): qty 1

## 2015-06-17 MED ORDER — METFORMIN HCL 500 MG PO TABS
1000.0000 mg | ORAL_TABLET | Freq: Two times a day (BID) | ORAL | Status: DC
  Administered 2015-06-17 – 2015-06-18 (×2): 1000 mg via ORAL
  Filled 2015-06-17 (×6): qty 2

## 2015-06-17 NOTE — Progress Notes (Addendum)
Patient ID: DENIYA SCOW, female   DOB: 01-25-1932, 81 y.o.   MRN: [id number] [facility] PROGRESS NOTE  MATIANA MARTINCIC [id number] DOB: Jun 18, 1932 DOA: 06/15/2015 PCP: Sallee Lange, MD  Brief narrative:    81 y.o. female with past medical history of hypertension, diabetes who presented to [facility] ED with reports of abdominal pain, nausea and vomiting for few days prior to the admission. She was found to have SBO and ileitis on admission. She has been seen by GI in consultation. She was started on rocephin and flagyl. She was also found to have E.Coli UTI.  Assessment/Plan:    Principal Problem:   Partial small bowel obstruction (HCC) / Abdominal pain / Nausea and vomiting / Ileitis  - CT abd on admission demonstrated dilatation of small-bowel loops to 3.6 cm in maximal diameter with fecalization at the mid to distal ileum and a diffusely thick-walled segment of distal ileum distal to the fecalization compatible with acute infectious or inflammatory ileitis. Associated free fluid and mild mesenteric inflammation noted, findings compatible with partial obstruction due to ileitis. - Started on rocephin and flagyl - GI following, appreciate their input - Abd more distended this am - Will continue clears today - Continue IV fluids for hydration but reduce rate to 75 cc instead of 125 cc/hr  Active Problems:   Diabetes mellitus without complication without long term insulin use ([place]) - May resume metformin    UTI (urinary tract infection) due to E.Coli / Leukocytosis  - Urine culture on this admission grew E.Coli - Rocephin will cover for this    Essential hypertension - Continue Norvasc    Dyslipidemia associated with type 2 diabetes mellitus, goal LDL less than 70 - Continue gemfibrozil   DVT Prophylaxis  - Continue heparin subQ   Code Status: Full.  Family Communication:  plan of care discussed with the patient Disposition Plan: Home once SBO and ileitis resolves.   IV  access:  Peripheral IV  Procedures and diagnostic studies:    Ct Abdomen Pelvis W Contrast 06/15/2015   1. Dilatation of small-bowel loops to 3.6 cm in maximal diameter, with fecalization at the mid to distal ileum just below the umbilicus, and a diffusely thick-walled segment of distal ileum distal to the fecalization, compatible with acute infectious or inflammatory ileitis. Associated free fluid and mild mesenteric inflammation noted. More distal small bowel is decompressed. Findings compatible with partial obstruction due to ileitis. 2. Small amount of free fluid noted tracking about the liver and spleen. 3. Cholelithiasis.  Gallbladder otherwise unremarkable. 4. Small hiatal hernia seen. 5. Diffuse calcification along the abdominal aorta and its branches.   Dg Abd 2 Views 06/16/2015  No evidence of bowel obstruction or ileus.  Cholelithiasis.   Medical Consultants:  Gastroenterology  Other Consultants:  None     Leisa Lenz, MD  [facility] Pager [phone]  Time spent in minutes: 25 minutes  If 7PM-7AM, please contact night-coverage [email or web address] Password [facility] 06/17/2015, 7:58 AM   LOS: 2 days    HPI/Subjective: No acute overnight events. Patient reports she feels more distended.   Objective: Filed Vitals:   06/16/15 1338 06/16/15 1355 06/16/15 2241 06/17/15 0507  BP: 138/56 139/52 135/33 139/44  Pulse: 77 71 65 66  Temp: 98.4 F (36.9 C) 98.1 F (36.7 C) 97.6 F (36.4 C) 97.9 F (36.6 C)  TempSrc: Oral Oral Oral Oral  Resp: 18 18 16 14   Height:      Weight:      SpO2:  98% 97% 95% 97%    Intake/Output Summary (Last 24 hours) at 06/17/15 0758 Last data filed at 06/17/15 0600  Gross per 24 hour  Intake   3090 ml  Output   1150 ml  Net   1940 ml    Exam:   General:  Pt is alert, follows commands appropriately, not in acute distress  Cardiovascular: Regular rate and rhythm, S1/S2 (+)   Respiratory: Clear to auscultation bilaterally, no wheezing,  no crackles, no rhonchi  Abdomen: more distended this am, non tender  Extremities: No edema, pulses palpable bilaterally  Neuro: Grossly nonfocal  Data Reviewed: Basic Metabolic Panel:  Recent Labs Lab 06/15/15 0151 06/16/15 0415  NA 137 141  K 4.0 3.7  CL 100* 105  CO2 24 27  GLUCOSE 187* 121*  BUN 34* 22*  CREATININE 0.99 0.89  CALCIUM 10.3 8.4*   Liver Function Tests:  Recent Labs Lab 06/15/15 0151  AST 24  ALT 19  ALKPHOS 101  BILITOT 0.9  PROT 8.1  ALBUMIN 4.8    Recent Labs Lab 06/15/15 0151  LIPASE 47   No results for input(s): AMMONIA in the last 168 hours. CBC:  Recent Labs Lab 06/15/15 0151 06/15/15 1511 06/16/15 0415  WBC 13.8* 12.1* 6.3  NEUTROABS 11.3* 9.4* 3.9  HGB 14.0 12.2 11.0*  HCT 42.1 37.6 33.3*  MCV 91.7 94.0 94.1  PLT 341 294 255   Cardiac Enzymes: No results for input(s): CKTOTAL, CKMB, CKMBINDEX, TROPONINI in the last 168 hours. BNP: Invalid input(s): POCBNP CBG:  Recent Labs Lab 06/15/15 0159  GLUCAP 171*    Recent Results (from the past 240 hour(s))  Culture, Urine     Status: None (Preliminary result)   Collection Time: 06/15/15 10:31 AM  Result Value Ref Range Status   Specimen Description URINE, CLEAN CATCH  Final   Special Requests NONE  Final   Culture   Final    >=100,000 COLONIES/mL ESCHERICHIA COLI Performed at [facility]    Report Status PENDING  Incomplete     Scheduled Meds: . ALPRAZolam  0.25 mg Oral QHS  . amLODipine  10 mg Oral Daily  . aspirin  81 mg Oral Daily  . cefTRIAXone (ROCEPHIN)  IV  2 g Intravenous Q24H  . gemfibrozil  600 mg Oral Daily  . heparin  5,000 Units Subcutaneous 3 times per day  . metronidazole  500 mg Intravenous Q8H  . multivitamin with minerals  1 tablet Oral Daily  . ondansetron (ZOFRAN) IV  4 mg Intravenous Q6H   Continuous Infusions: . sodium chloride 125 mL/hr at 06/17/15 0502

## 2015-06-17 NOTE — Progress Notes (Signed)
     [facility] Progress Note  Subjective:  Feels worse today. Tried full liquids, felt nauseous. Had a formed BM last pm but a loose BM this morning. More distended. Nauseous but has not vomited.    Objective:  Vital signs in last 24 hours: Temp:  [97.6 F (36.4 C)-98.4 F (36.9 C)] 97.9 F (36.6 C) (12/17 0507) Pulse Rate:  [65-77] 66 (12/17 0507) Resp:  [14-18] 14 (12/17 0507) BP: (135-139)/(33-56) 139/44 mmHg (12/17 0507) SpO2:  [95 %-98 %] 97 % (12/17 0507) Last BM Date: 06/16/15 General:   Alert,  Well-developed,    in NAD Heart:  Regular rate and rhythm; no murmurs Pulm;lungs clear Abdomen:  Soft, distendede, tender right side with guarding but no rebound, quiet BS Extremities:  Without edema. Neurologic: Alert and  oriented x4;  grossly normal neurologically. Psych:  Alert and cooperative. Normal mood and affect.  Intake/Output from previous day: 12/16 0701 - 12/17 0700 In: 3090 [P.O.:240; I.V.:2500; IV Piggyback:350] Out: 1150 [Urine:1150]   ASSESSMENT/PLAN:  Partial SBO-- seems to have regresses in terms of progress. Will back down to clear liquids. Check abd films. Based on results, may need to consider colonoscopy for eval of ileum.    LOS: 2 days   Hvozdovic, Vita Barley PA-C 06/17/2015, Pager [phone] Mon-Fri 8a-5p [phone] after 5p, weekends, holidays    [facility] Attending   I have taken an interval history, reviewed the chart and examined the patient. I agree with the Advanced Practitioner's note, impression and recommendations.    Partial sbo - a bit worse clinically 12/17 xray today no change - no dilated bowel  I was thinking this was more likely adhesive disease but now thinking CT scan and ileitis more likely - sudden onset would go with an ischemic event.  Heavily calcified aorta - ? If plaque ruptured and went to an end artery supplying ileum Infection remains possible  Crohn's possible but seems less likely   Will see how  she is tomorrow - back to clears Stay on Abx Next eval CT-angiogram vs CT-enterography I think  Gatha Mayer, MD, [facility] [phone] (pager) [phone] after 5 PM, weekends and holidays  06/17/2015 12:14 PM

## 2015-06-18 ENCOUNTER — Inpatient Hospital Stay (HOSPITAL_COMMUNITY): Payer: Medicare Other

## 2015-06-18 ENCOUNTER — Encounter (HOSPITAL_COMMUNITY): Payer: Self-pay | Admitting: Radiology

## 2015-06-18 DIAGNOSIS — K50012 Crohn's disease of small intestine with intestinal obstruction: Secondary | ICD-10-CM

## 2015-06-18 DIAGNOSIS — B9620 Unspecified Escherichia coli [E. coli] as the cause of diseases classified elsewhere: Secondary | ICD-10-CM | POA: Diagnosis present

## 2015-06-18 DIAGNOSIS — N390 Urinary tract infection, site not specified: Secondary | ICD-10-CM

## 2015-06-18 MED ORDER — IOHEXOL 300 MG/ML  SOLN
100.0000 mL | Freq: Once | INTRAMUSCULAR | Status: AC | PRN
  Administered 2015-06-18: 100 mL via INTRAVENOUS

## 2015-06-18 NOTE — Progress Notes (Addendum)
Patient ID: [name], female   DOB: [birth year], 81 y.o.   MRN: [id number] [facility] PROGRESS NOTE  [name] [id number] DOB: 05-23-1932 DOA: 06/15/2015 PCP: Sallee Lange, MD  Brief narrative:    81 y.o. female with past medical history of hypertension, diabetes who presented to [facility] ED with reports of abdominal pain, nausea and vomiting for few days prior to the admission. She was found to have SBO and ileitis on admission. She has been seen by GI in consultation. She was started on rocephin and flagyl. She was also found to have E.Coli UTI.  She continues to have abdominal distention. GI plans on CT enterography.   Assessment/Plan:    Principal Problem:   Partial small bowel obstruction (HCC) / Abdominal pain / Nausea and vomiting / Ileitis  - CT abd on admission demonstrated dilatation of small-bowel loops to 3.6 cm in maximal diameter with fecalization at the mid to distal ileum and a diffusely thick-walled segment of distal ileum distal to the fecalization compatible with acute infectious or inflammatory ileitis. Associated free fluid and mild mesenteric inflammation noted, findings compatible with partial obstruction due to ileitis. - Started on rocephin and flagyl - Still distended, no significant BM in past 24 hours - GI plans CT enterography today or tomorrow - May need to be NPO until this resolves  - Continue IV fluids for hydration   Active Problems:   Diabetes mellitus without complication without long term insulin use (HCC) - Continue metformin    UTI (urinary tract infection) due to E.Coli / Leukocytosis  - Urine culture on this admission grew E.Coli - Continue rocephin     Essential hypertension - Continue Norvasc - BP 137/65    Dyslipidemia associated with type 2 diabetes mellitus, goal LDL less than 70 - Continue gemfibrozil   DVT Prophylaxis  - Continue heparin subQ while pt in hospital    Code Status: Full.  Family Communication:   plan of care discussed with the patient Disposition Plan: Home once SBO and ileitis resolves.   IV access:  Peripheral IV  Procedures and diagnostic studies:    Ct Abdomen Pelvis W Contrast 06/15/2015   1. Dilatation of small-bowel loops to 3.6 cm in maximal diameter, with fecalization at the mid to distal ileum just below the umbilicus, and a diffusely thick-walled segment of distal ileum distal to the fecalization, compatible with acute infectious or inflammatory ileitis. Associated free fluid and mild mesenteric inflammation noted. More distal small bowel is decompressed. Findings compatible with partial obstruction due to ileitis. 2. Small amount of free fluid noted tracking about the liver and spleen. 3. Cholelithiasis.  Gallbladder otherwise unremarkable. 4. Small hiatal hernia seen. 5. Diffuse calcification along the abdominal aorta and its branches.   Dg Abd 2 Views 06/16/2015  No evidence of bowel obstruction or ileus.  Cholelithiasis.   Medical Consultants:  Gastroenterology  Other Consultants:  None   IAnti-Infectives:   Flagyl 1215/2016 --> Rocephin 06/15/2015 -->     Leisa Lenz, MD  [facility] Pager [phone]  Time spent in minutes: 25 minutes  If 7PM-7AM, please contact night-coverage [email or web address] Password [facility] 06/18/2015, 12:29 PM   LOS: 3 days    HPI/Subjective: No acute overnight events. Patient reports not feeling as good this am, cant really tolerate clears.  Objective: Filed Vitals:   06/17/15 1326 06/17/15 1718 06/17/15 2200 06/18/15 0623  BP: 152/52 142/56 142/55 137/65  Pulse: 59 68 58 60  Temp: 98.2 F (36.8 C) 98.6 F (  37 C) 98 F (36.7 C) 98.2 F (36.8 C)  TempSrc: Oral Oral Oral Oral  Resp: 16 16 16 16   Height:      Weight:      SpO2: 99% 99% 100% 99%    Intake/Output Summary (Last 24 hours) at 06/18/15 1229 Last data filed at 06/18/15 0200  Gross per 24 hour  Intake 2145.83 ml  Output   1250 ml  Net 895.83 ml     Exam:   General:  Pt is not in acute distress  Cardiovascular: Rate controlled, Appreciate S1, S2  Respiratory: No wheezing, no crackles, no rhonchi  Abdomen:  Distended, (+)BS, tender in lower to mid abdomen to palpation with voluntary guarding, no rebound   Extremities: No leg swelling, pulses palpable  Neuro: Nonfocal  Data Reviewed: Basic Metabolic Panel:  Recent Labs Lab 06/15/15 0151 06/16/15 0415 06/17/15 0943  NA 137 141 143  K 4.0 3.7 3.8  CL 100* 105 106  CO2 24 27 29   GLUCOSE 187* 121* 154*  BUN 34* 22* 12  CREATININE 0.99 0.89 0.85  CALCIUM 10.3 8.4* 9.3   Liver Function Tests:  Recent Labs Lab 06/15/15 0151  AST 24  ALT 19  ALKPHOS 101  BILITOT 0.9  PROT 8.1  ALBUMIN 4.8    Recent Labs Lab 06/15/15 0151  LIPASE 47   No results for input(s): AMMONIA in the last 168 hours. CBC:  Recent Labs Lab 06/15/15 0151 06/15/15 1511 06/16/15 0415 06/17/15 0943  WBC 13.8* 12.1* 6.3 5.4  NEUTROABS 11.3* 9.4* 3.9  --   HGB 14.0 12.2 11.0* 11.7*  HCT 42.1 37.6 33.3* 35.7*  MCV 91.7 94.0 94.1 93.5  PLT 341 294 255 273   Cardiac Enzymes: No results for input(s): CKTOTAL, CKMB, CKMBINDEX, TROPONINI in the last 168 hours. BNP: Invalid input(s): POCBNP CBG:  Recent Labs Lab 06/15/15 0159  GLUCAP 171*    Recent Results (from the past 240 hour(s))  Culture, Urine     Status: None   Collection Time: 06/15/15 10:31 AM  Result Value Ref Range Status   Specimen Description URINE, CLEAN CATCH  Final   Special Requests NONE  Final   Culture   Final    >=100,000 COLONIES/mL ESCHERICHIA COLI Performed at [facility]    Report Status 06/17/2015 FINAL  Final   Organism ID, Bacteria ESCHERICHIA COLI  Final      Susceptibility   Escherichia coli - MIC*    AMPICILLIN >=32 RESISTANT Resistant     CEFAZOLIN <=4 SENSITIVE Sensitive     CEFTRIAXONE <=1 SENSITIVE Sensitive     CIPROFLOXACIN <=0.25 SENSITIVE Sensitive     GENTAMICIN >=16  RESISTANT Resistant     IMIPENEM <=0.25 SENSITIVE Sensitive     NITROFURANTOIN <=16 SENSITIVE Sensitive     TRIMETH/SULFA >=320 RESISTANT Resistant     AMPICILLIN/SULBACTAM 16 INTERMEDIATE Intermediate     PIP/TAZO <=4 SENSITIVE Sensitive     * >=100,000 COLONIES/mL ESCHERICHIA COLI     Scheduled Meds: . ALPRAZolam  0.25 mg Oral QHS  . amLODipine  10 mg Oral Daily  . aspirin  81 mg Oral Daily  . cefTRIAXone (ROCEPHIN)  IV  2 g Intravenous Q24H  . gemfibrozil  600 mg Oral Daily  . heparin  5,000 Units Subcutaneous 3 times per day  . metFORMIN  1,000 mg Oral BID WC  . metronidazole  500 mg Intravenous Q8H  . multivitamin-lutein  1 capsule Oral Daily  . ondansetron (ZOFRAN) IV  4 mg Intravenous Q6H   Continuous Infusions: . sodium chloride 75 mL/hr at 06/17/15 2201

## 2015-06-18 NOTE — Progress Notes (Signed)
   CT-E was normal - no ileitis, other bowel pathology.  She looks and feels a bit better - did have catharsis from contrast.  I think abd is less tender.  It remains unknown what caused her problems but she seems better now.  Will advance to full liquids again.  I think could stop Abx soon and definitely at dc  We will see again in AM  Gatha Mayer, MD, [facility] [phone] (pager) [phone] after 5 PM, weekends and holidays  06/18/2015 2:18 PM

## 2015-06-18 NOTE — Progress Notes (Signed)
     [facility] Progress Note  Subjective:  Less nausea, but still with abd pain. Belching a lot. Passed a very small loose BM today. No appetite.   Objective:  Vital signs in last 24 hours: Temp:  [98 F (36.7 C)-98.6 F (37 C)] 98.2 F (36.8 C) (12/18 0623) Pulse Rate:  [58-68] 60 (12/18 0623) Resp:  [16] 16 (12/18 0623) BP: (137-152)/(52-65) 137/65 mmHg (12/18 0623) SpO2:  [99 %-100 %] 99 % (12/18 0623) Last BM Date: 06/13/15 General:   Alert,  Well-developed,    in NAD Heart:  Regular rate and rhythm; no murmurs Pulm;lungs clear Abdomen: Soft, distendede, tender diffusely with guarding but no rebound, quiet BS    Extremities:  Without edema. Neurologic  Alert and  oriented x4;  grossly normal neurologically. Psych: Alert and cooperative. Normal mood and affect.  Intake/Output from previous day: 12/17 0701 - 12/18 0700 In: 2645.8 [P.O.:360; I.V.:1785.8; IV Piggyback:500] Out: 1250 [Urine:1250] Intake/Output this shift:    Lab Results:  Recent Labs  06/15/15 1511 06/16/15 0415 06/17/15 0943  WBC 12.1* 6.3 5.4  HGB 12.2 11.0* 11.7*  HCT 37.6 33.3* 35.7*  PLT 294 255 273   BMET  Recent Labs  06/16/15 0415 06/17/15 0943  NA 141 143  K 3.7 3.8  CL 105 106  CO2 27 29  GLUCOSE 121* 154*  BUN 22* 12  CREATININE 0.89 0.85  CALCIUM 8.4* 9.3     Dg Abd 2 Views  06/17/2015  CLINICAL DATA:  Abdominal pain EXAM: ABDOMEN - 2 VIEW COMPARISON:  06/16/2015 FINDINGS: Scattered large and small bowel gas is noted. No free intraperitoneal air is seen. No obstructive changes are noted. Postsurgical changes are seen. Multiple gallstones are noted in the right upper quadrant. No bony abnormality is noted. IMPRESSION: Cholelithiasis. No acute abnormality is noted. Electronically Signed   By: Inez Catalina M.D.   On: 06/17/2015 09:46    ASSESSMENT/PLAN:  Partial SBO. XRay yesterday with no dilated bowel. CT with ileitis-? Etiology. Will schedule for CT  enterography.Continue clears, IV, abx.    LOS: 3 days   Hvozdovic, Vita Barley PA-C 06/18/2015, Pager [phone] Mon-Fri 8a-5p [phone] after 5p, weekends, holidays     GI Attending   I have taken an interval history, reviewed the chart and examined the patient. I agree with the Advanced Practitioner's note, impression and recommendations.    She is not improving - check CT-Enterography - ? Ischemic, infectious, ? Internal henrnia,   She could need a surgical consult depending upon what that shows.  Gatha Mayer, MD, [facility] [phone] (pager) [phone] after 5 PM, weekends and holidays  06/18/2015 10:08 AM

## 2015-06-19 MED ORDER — METFORMIN HCL 500 MG PO TABS
1000.0000 mg | ORAL_TABLET | Freq: Two times a day (BID) | ORAL | Status: DC
  Administered 2015-06-20 – 2015-06-21 (×2): 1000 mg via ORAL
  Filled 2015-06-19 (×4): qty 2

## 2015-06-19 NOTE — Progress Notes (Signed)
[facility] Progress Note  Subjective:  WBC 5.4. CT entero with no ileitis or bowel pathology. Had a large loose BM this morning followed by a small formed stool. Less distention. Still feels full with a few sips, but is hungry and would like to try to advance diet.   Objective:  Vital signs in last 24 hours: Temp:  [97.7 F (36.5 C)-98.1 F (36.7 C)] 98.1 F (36.7 C) (12/19 0527) Pulse Rate:  [58-73] 73 (12/19 0527) Resp:  [16-18] 16 (12/19 0527) BP: (146-162)/(49-61) 158/55 mmHg (12/19 0527) SpO2:  [98 %-99 %] 98 % (12/19 0527) Last BM Date: 06/18/15 General:   Alert,  Well-developed,    in NAD Heart:  Regular rate and rhythm; no murmurs Pulm;lungs clear Abdomen:  Soft, nontender , less distended Normal bowel sounds, without guarding, and without rebound.   Extremities:  Without edema. Neurologic:  Alert and  oriented x4;  grossly normal neurologically. Psych:  Alert and cooperative. Normal mood and affect.  Intake/Output from previous day: 12/18 0701 - 12/19 0700 In: 1340 [P.O.:240; I.V.:900; IV Piggyback:200] Out: -  Intake/Output this shift:    Lab Results:  Recent Labs  06/17/15 0943  WBC 5.4  HGB 11.7*  HCT 35.7*  PLT 273   BMET  Recent Labs  06/17/15 0943  NA 143  K 3.8  CL 106  CO2 29  GLUCOSE 154*  BUN 12  CREATININE 0.85  CALCIUM 9.3    Ct Entero Abd/pelvis W/cm  06/18/2015  CLINICAL DATA:  Small obstruction, and question ileus, generalized abdominal tenderness for 4 days with nausea and vomiting, hypertension, diabetes mellitus, prior appendectomy, hysterectomy and colon surgery (for diverticulitis) EXAM: CT ABDOMEN AND PELVIS WITH CONTRAST (ENTEROGRAPHY) TECHNIQUE: Multidetector CT of the abdomen and pelvis during bolus administration of intravenous contrast. Negative oral contrast VoLumen was given. Sagittal and coronal MPR images reconstructed from axial data set. CONTRAST:  153mL OMNIPAQUE IOHEXOL 300 MG/ML SOLN IV.  Negative oral contrast administered. COMPARISON:  06/15/2015 FINDINGS: Minimal bibasilar atelectasis. Dependent gallstones in a mildly distended gallbladder. No CBD stones or biliary dilatation. 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 Liver, spleen, pancreas, kidneys, and adrenal glands otherwise normal. Scattered atherosclerotic calcifications. Cystocele, bladder otherwise unremarkable. Stomach and bowel loops grossly normal appearance. No focal bowel wall thickening, areas of abnormal mucosal enhancement, mass, or dilatation. Prior ventral herniorrhaphy. No mass, adenopathy, free air, free fluid or inflammatory process. Bones demineralized. IMPRESSION: Cholelithiasis with minimally distended gallbladder ; no gallbladder wall thickening or biliary dilatation identified. Cystocele. Otherwise negative exam. Electronically Signed   By: Lavonia Dana M.D.   On: 06/18/2015 13:06    ASSESSMENT/PLAN:  Partial SBO, slowly resolving. Will give trial of soft diet. Depending on tolerance, may be able to go Tues or Wed. Can likely d/c abx.  Principal Problem:   Partial small bowel obstruction (HCC) Active Problems:   Controlled type 2 diabetes mellitus without complication, without long-term current use of insulin (HCC)   Nausea and vomiting   HLD (hyperlipidemia)   HTN (hypertension)   Leukocytosis   Ileitis, regional, with intestinal obstruction   E-coli UTI     LOS: 4 days   Hvozdovic, Vita Barley PA-C 06/19/2015, Pager [phone] Mon-Fri 8a-5p [phone] after 5p, weekends, holidays   Attending physician's note   I have taken an interval history, reviewed the chart and examined the patient. I agree with the Advanced Practitioner's note, impression and recommendations. Ileus is resolving. Tolerating PO intake better today. CT  enterography negative for any focal stricture or mass. We will sign off, available for any questions  K Denzil Magnuson, MD [phone] Mon-Fri 8a-5p [phone] after 5p,  weekends, holidays

## 2015-06-19 NOTE — Care Management Important Message (Signed)
Important Message  Patient Details  Name: [name] MRN: [id number] Date of Birth: 22-Oct-1931   Medicare Important Message Given:  Yes    Camillo Flaming 06/19/2015, 12:12 [place] Message  Patient Details  Name: [name] MRN: [id number] Date of Birth: 1932/04/04   Medicare Important Message Given:  Yes    Camillo Flaming 06/19/2015, 12:12 PM

## 2015-06-19 NOTE — Progress Notes (Addendum)
Patient ID: [name], female   DOB: April 28, 1932, 81 y.o.   MRN: [id number] [facility] PROGRESS NOTE  [name] [id number] DOB: 01/25/1932 DOA: 06/15/2015 PCP: Sallee Lange, MD  Brief narrative:    81 y.o. female with past medical history of hypertension, diabetes who presented to [facility] ED with reports of abdominal pain, nausea and vomiting for few days prior to the admission. She was found to have SBO and ileitis on admission. She has been seen by GI in consultation. She was started on rocephin and flagyl. She was also found to have E.Coli UTI. Because she had more abdominal distention 12/18, CT enterography done and it was actually normal. This am she feels better and her diet will be advanced per GI recommendations.   Assessment/Plan:    Principal Problem:   Partial small bowel obstruction (HCC) / Abdominal pain / Nausea and vomiting / Ileitis  - on admission, CT abd  demonstrated dilatation of small-bowel loops to 3.6 cm in maximal diameter with fecalization at the mid to distal ileum and a diffusely thick-walled segment of distal ileum distal to the fecalization compatible with acute infectious or inflammatory ileitis. Associated free fluid and mild mesenteric inflammation noted, findings compatible with partial obstruction due to ileitis. - Patient was started on Rocephin and Flagyl. Stop flagyl today.  - Her abdomen was more distended yesterday for which reason a CT enterography was done but it was negative. - Per GI, diet will be advanced to soft diet this morning. Will follow-up if antibiotics can be discontinued. - Patient well controlled. No nausea or vomiting. - Had one bowel movement on 06/17/2015.  Active Problems:   Diabetes mellitus without complication without long term insulin use (HCC) - Continue metformin    UTI (urinary tract infection) due to E.Coli / Leukocytosis  - Urine culture on this admission grew E.Coli - Rocephin would cover for Escherichia  coli UTI.     Essential hypertension - Continue Norvasc    Dyslipidemia associated with type 2 diabetes mellitus, goal LDL less than 70 - Continue gemfibrozil   DVT Prophylaxis  - Continue heparin subQ   Code Status: Full.  Family Communication:  plan of care discussed with the patient Disposition Plan: Home once SBO and ileitis resolves. Likely by 12/21.  IV access:  Peripheral IV  Procedures and diagnostic studies:    Ct Abdomen Pelvis W Contrast 06/15/2015   1. Dilatation of small-bowel loops to 3.6 cm in maximal diameter, with fecalization at the mid to distal ileum just below the umbilicus, and a diffusely thick-walled segment of distal ileum distal to the fecalization, compatible with acute infectious or inflammatory ileitis. Associated free fluid and mild mesenteric inflammation noted. More distal small bowel is decompressed. Findings compatible with partial obstruction due to ileitis. 2. Small amount of free fluid noted tracking about the liver and spleen. 3. Cholelithiasis.  Gallbladder otherwise unremarkable. 4. Small hiatal hernia seen. 5. Diffuse calcification along the abdominal aorta and its branches.   Dg Abd 2 Views 06/16/2015  No evidence of bowel obstruction or ileus.  Cholelithiasis.   Medical Consultants:  Gastroenterology  Other Consultants:  None   IAnti-Infectives:   Flagyl 1215/2016 --> 06/19/2015 Rocephin 06/15/2015 -->     Leisa Lenz, MD  [facility] Pager [phone]  Time spent in minutes: 25 minutes  If 7PM-7AM, please contact night-coverage [email or web address] Password [email or web address] 06/19/2015, 11:15 AM   LOS: 4 days    HPI/Subjective: No acute overnight events. Patient reports she feels  better.   Objective: Filed Vitals:   06/18/15 0623 06/18/15 1408 06/18/15 2124 06/19/15 0527  BP: 137/65 146/49 162/61 158/55  Pulse: 60 59 58 73  Temp: 98.2 F (36.8 C) 97.7 F (36.5 C) 98 F (36.7 C) 98.1 F (36.7 C)  TempSrc: Oral Oral Oral Oral   Resp: 16 18 16 16   Height:      Weight:      SpO2: 99% 99% 98% 98%    Intake/Output Summary (Last 24 hours) at 06/19/15 1115 Last data filed at 06/19/15 0600  Gross per 24 hour  Intake   1340 ml  Output      0 ml  Net   1340 ml    Exam:   General:  Pt is alert, no distress  Cardiovascular: RRR, (+) S1, S2   Respiratory: bilateral air entry, no wheezing    Abdomen:  (+) BS, firm to palpation, no guarding   Extremities: No leg swelling, palpable pulses   Neuro: No focal deficits   Data Reviewed: Basic Metabolic Panel:  Recent Labs Lab 06/15/15 0151 06/16/15 0415 06/17/15 0943  NA 137 141 143  K 4.0 3.7 3.8  CL 100* 105 106  CO2 24 27 29   GLUCOSE 187* 121* 154*  BUN 34* 22* 12  CREATININE 0.99 0.89 0.85  CALCIUM 10.3 8.4* 9.3   Liver Function Tests:  Recent Labs Lab 06/15/15 0151  AST 24  ALT 19  ALKPHOS 101  BILITOT 0.9  PROT 8.1  ALBUMIN 4.8    Recent Labs Lab 06/15/15 0151  LIPASE 47   No results for input(s): AMMONIA in the last 168 hours. CBC:  Recent Labs Lab 06/15/15 0151 06/15/15 1511 06/16/15 0415 06/17/15 0943  WBC 13.8* 12.1* 6.3 5.4  NEUTROABS 11.3* 9.4* 3.9  --   HGB 14.0 12.2 11.0* 11.7*  HCT 42.1 37.6 33.3* 35.7*  MCV 91.7 94.0 94.1 93.5  PLT 341 294 255 273   Cardiac Enzymes: No results for input(s): CKTOTAL, CKMB, CKMBINDEX, TROPONINI in the last 168 hours. BNP: Invalid input(s): POCBNP CBG:  Recent Labs Lab 06/15/15 0159  GLUCAP 171*    Recent Results (from the past 240 hour(s))  Culture, Urine     Status: None   Collection Time: 06/15/15 10:31 AM  Result Value Ref Range Status   Specimen Description URINE, CLEAN CATCH  Final   Special Requests NONE  Final   Culture   Final    >=100,000 COLONIES/mL ESCHERICHIA COLI Performed at [facility]    Report Status 06/17/2015 FINAL  Final   Organism ID, Bacteria ESCHERICHIA COLI  Final      Susceptibility   Escherichia coli - MIC*    AMPICILLIN  >=32 RESISTANT Resistant     CEFAZOLIN <=4 SENSITIVE Sensitive     CEFTRIAXONE <=1 SENSITIVE Sensitive     CIPROFLOXACIN <=0.25 SENSITIVE Sensitive     GENTAMICIN >=16 RESISTANT Resistant     IMIPENEM <=0.25 SENSITIVE Sensitive     NITROFURANTOIN <=16 SENSITIVE Sensitive     TRIMETH/SULFA >=320 RESISTANT Resistant     AMPICILLIN/SULBACTAM 16 INTERMEDIATE Intermediate     PIP/TAZO <=4 SENSITIVE Sensitive     * >=100,000 COLONIES/mL ESCHERICHIA COLI     Scheduled Meds: . ALPRAZolam  0.25 mg Oral QHS  . amLODipine  10 mg Oral Daily  . aspirin  81 mg Oral Daily  . cefTRIAXone (ROCEPHIN)  IV  2 g Intravenous Q24H  . gemfibrozil  600 mg Oral Daily  . heparin  5,000 Units Subcutaneous 3 times per day  . [START ON 06/20/2015] metFORMIN  1,000 mg Oral BID WC  . metronidazole  500 mg Intravenous Q8H  . multivitamin-lutein  1 capsule Oral Daily  . ondansetron (ZOFRAN) IV  4 mg Intravenous Q6H   Continuous Infusions: . sodium chloride 75 mL/hr at 06/19/15 0944

## 2015-06-19 NOTE — Care Management Note (Signed)
Case Management Note  Patient Details  Name: NYAH KAO MRN: [id number] Date of Birth: June 25, 1932  Subjective/Objective:       Admitted with gastroenteritis             Action/Plan: Discharge planning, no HH needs identified  Expected Discharge Date:  06/18/15               Expected Discharge Plan:  Home/Self Care  In-House Referral:  NA  Discharge planning Services  CM Consult  Post Acute Care Choice:  NA Choice offered to:  NA  DME Arranged:  N/A DME Agency:  NA  HH Arranged:  NA HH Agency:  NA  Status of Service:  Completed, signed off  Medicare Important Message Given:    Date Medicare IM Given:    Medicare IM give by:    Date Additional Medicare IM Given:    Additional Medicare Important Message give by:     If discussed at [place] of Stay Meetings, dates discussed:    Additional Comments:  Guadalupe Maple, RN 06/19/2015, 11:10 AM

## 2015-06-20 LAB — GLUCOSE, CAPILLARY
GLUCOSE-CAPILLARY: 169 mg/dL — AB (ref 65–99)
GLUCOSE-CAPILLARY: 113 mg/dL — AB (ref 65–99)
GLUCOSE-CAPILLARY: 127 mg/dL — AB (ref 65–99)

## 2015-06-20 NOTE — Progress Notes (Signed)
Patient ID: TIMIRA LEIDEL, female   DOB: 1932-05-19, 81 y.o.   MRN: [id number] [facility] PROGRESS NOTE  SAILY DREWRY [id number] DOB: 1931/10/27 DOA: 06/15/2015 PCP: Sallee Lange, MD  Brief narrative:    81 y.o. female with past medical history of hypertension, diabetes who presented to [facility] ED with reports of abdominal pain, nausea and vomiting for few days prior to the admission. She was found to have SBO and ileitis on admission. She has been seen by GI in consultation. She was started on rocephin and flagyl. She was also found to have E.Coli UTI. Because she had more abdominal distention 12/18, CT enterography done and it was actually normal.   Assessment/Plan:    Principal Problem:   Partial small bowel obstruction (HCC) / Abdominal pain / Nausea and vomiting / Ileitis  - CT abd  On admission showed dilatation of small-bowel loops to 3.6 cm in maximal diameter with fecalization at the mid to distal ileum and a diffusely thick-walled segment of distal ileum distal to the fecalization compatible with acute infectious or inflammatory ileitis. Associated free fluid and mild mesenteric inflammation noted, findings compatible with partial obstruction due to ileitis. - Patient was on Rocephin and Flagyl. Flagyl stopped 06/19/2015. She is still on Rocephin for UTI. - CT enterography was negative. - She is on soft diet, so far tolerates well - No bowel movement yet.  Active Problems:   Diabetes mellitus without complication without long term insulin use (HCC) - Continue metformin    UTI (urinary tract infection) due to E.Coli / Leukocytosis  - Urine culture on this admission grew E.Coli - Continue rocephin through 12/21.    Essential hypertension - Continue Norvasc - BP 137/55    Dyslipidemia associated with type 2 diabetes mellitus, goal LDL less than 70 - Continue gemfibrozil   DVT Prophylaxis  - Continue heparin subQ while pt in hospital    Code Status: Full.   Family Communication:  plan of care discussed with the patient Disposition Plan: Likely by 12/21.  IV access:  Peripheral IV  Procedures and diagnostic studies:    Ct Abdomen Pelvis W Contrast 06/15/2015   1. Dilatation of small-bowel loops to 3.6 cm in maximal diameter, with fecalization at the mid to distal ileum just below the umbilicus, and a diffusely thick-walled segment of distal ileum distal to the fecalization, compatible with acute infectious or inflammatory ileitis. Associated free fluid and mild mesenteric inflammation noted. More distal small bowel is decompressed. Findings compatible with partial obstruction due to ileitis. 2. Small amount of free fluid noted tracking about the liver and spleen. 3. Cholelithiasis.  Gallbladder otherwise unremarkable. 4. Small hiatal hernia seen. 5. Diffuse calcification along the abdominal aorta and its branches.   Dg Abd 2 Views 06/16/2015  No evidence of bowel obstruction or ileus.  Cholelithiasis.   Medical Consultants:  Gastroenterology  Other Consultants:  None   IAnti-Infectives:   Flagyl 1215/2016 --> 06/19/2015 Rocephin 06/15/2015 -->     Leisa Lenz, MD  [facility] Pager [phone]  Time spent in minutes: 15 minutes  If 7PM-7AM, please contact night-coverage [email or web address] Password [email or web address] 06/20/2015, 11:12 AM   LOS: 5 days    HPI/Subjective: No acute overnight events. Patient reports she feels okay. No BM yet.  Objective: Filed Vitals:   06/19/15 0527 06/19/15 1420 06/19/15 2200 06/20/15 0550  BP: 158/55 135/57 164/59 137/55  Pulse: 73 79 66 58  Temp: 98.1 F (36.7 C) 98 F (36.7 C) 98 F (36.7 C)  97.9 F (36.6 C)  TempSrc: Oral Oral Oral Oral  Resp: 16 16 16 17   Height:      Weight:      SpO2: 98% 100% 96% 98%    Intake/Output Summary (Last 24 hours) at 06/20/15 1112 Last data filed at 06/20/15 1000  Gross per 24 hour  Intake   1730 ml  Output      0 ml  Net   1730 ml    Exam:   General:   Pt is alert, awake, no distress  Cardiovascular: Rate controlled, appreciate S1, S2   Respiratory: No wheezing, no rhonchi  Abdomen:  No distention, appreciate bowel sounds  Extremities: No edema, palpable pulses bilaterally  Neuro: Nonfocal   Data Reviewed: Basic Metabolic Panel:  Recent Labs Lab 06/15/15 0151 06/16/15 0415 06/17/15 0943  NA 137 141 143  K 4.0 3.7 3.8  CL 100* 105 106  CO2 24 27 29   GLUCOSE 187* 121* 154*  BUN 34* 22* 12  CREATININE 0.99 0.89 0.85  CALCIUM 10.3 8.4* 9.3   Liver Function Tests:  Recent Labs Lab 06/15/15 0151  AST 24  ALT 19  ALKPHOS 101  BILITOT 0.9  PROT 8.1  ALBUMIN 4.8    Recent Labs Lab 06/15/15 0151  LIPASE 47   No results for input(s): AMMONIA in the last 168 hours. CBC:  Recent Labs Lab 06/15/15 0151 06/15/15 1511 06/16/15 0415 06/17/15 0943  WBC 13.8* 12.1* 6.3 5.4  NEUTROABS 11.3* 9.4* 3.9  --   HGB 14.0 12.2 11.0* 11.7*  HCT 42.1 37.6 33.3* 35.7*  MCV 91.7 94.0 94.1 93.5  PLT 341 294 255 273   Cardiac Enzymes: No results for input(s): CKTOTAL, CKMB, CKMBINDEX, TROPONINI in the last 168 hours. BNP: Invalid input(s): POCBNP CBG:  Recent Labs Lab 06/15/15 0159 06/20/15 0816  GLUCAP 171* 169*    Recent Results (from the past 240 hour(s))  Culture, Urine     Status: None   Collection Time: 06/15/15 10:31 AM  Result Value Ref Range Status   Specimen Description URINE, CLEAN CATCH  Final   Special Requests NONE  Final   Culture   Final    >=100,000 COLONIES/mL ESCHERICHIA COLI Performed at [facility]    Report Status 06/17/2015 FINAL  Final   Organism ID, Bacteria ESCHERICHIA COLI  Final      Susceptibility   Escherichia coli - MIC*    AMPICILLIN >=32 RESISTANT Resistant     CEFAZOLIN <=4 SENSITIVE Sensitive     CEFTRIAXONE <=1 SENSITIVE Sensitive     CIPROFLOXACIN <=0.25 SENSITIVE Sensitive     GENTAMICIN >=16 RESISTANT Resistant     IMIPENEM <=0.25 SENSITIVE Sensitive      NITROFURANTOIN <=16 SENSITIVE Sensitive     TRIMETH/SULFA >=320 RESISTANT Resistant     AMPICILLIN/SULBACTAM 16 INTERMEDIATE Intermediate     PIP/TAZO <=4 SENSITIVE Sensitive     * >=100,000 COLONIES/mL ESCHERICHIA COLI     Scheduled Meds: . ALPRAZolam  0.25 mg Oral QHS  . amLODipine  10 mg Oral Daily  . aspirin  81 mg Oral Daily  . cefTRIAXone (ROCEPHIN)  IV  2 g Intravenous Q24H  . gemfibrozil  600 mg Oral Daily  . heparin  5,000 Units Subcutaneous 3 times per day  . metFORMIN  1,000 mg Oral BID WC  . multivitamin-lutein  1 capsule Oral Daily  . ondansetron (ZOFRAN) IV  4 mg Intravenous Q6H   Continuous Infusions: . sodium chloride 75 mL/hr at 06/19/15  2239         

## 2015-06-21 DIAGNOSIS — M1711 Unilateral primary osteoarthritis, right knee: Secondary | ICD-10-CM | POA: Diagnosis not present

## 2015-06-21 LAB — GLUCOSE, CAPILLARY: GLUCOSE-CAPILLARY: 115 mg/dL — AB (ref 65–99)

## 2015-06-21 MED ORDER — DOCUSATE SODIUM 100 MG PO CAPS
100.0000 mg | ORAL_CAPSULE | Freq: Two times a day (BID) | ORAL | Status: DC
  Administered 2015-06-21: 100 mg via ORAL
  Filled 2015-06-21: qty 1

## 2015-06-21 NOTE — Discharge Instructions (Signed)
Small Bowel Obstruction °A small bowel obstruction is a blockage in the small bowel. The small bowel, which is also called the small intestine, is a long, slender tube that connects the stomach to the colon. When a person eats and drinks, food and fluids go from the stomach to the small bowel. This is where most of the nutrients in the food and fluids are absorbed. °A small bowel obstruction will prevent food and fluids from passing through the small bowel as they normally do during digestion. The small bowel can become partially or completely blocked. This can cause symptoms such as abdominal pain, vomiting, and bloating. If this condition is not treated, it can be dangerous because the small bowel could rupture. °CAUSES °Common causes of this condition include: °· Scar tissue from previous surgery or radiation treatment. °· Recent surgery. This may cause the movements of the bowel to slow down and cause food to block the intestine. °· Hernias. °· Inflammatory bowel disease (colitis). °· Twisting of the bowel (volvulus). °· Tumors. °· A foreign body. °· Slipping of a part of the bowel into another part (intussusception). °SYMPTOMS °Symptoms of this condition include: °· Abdominal pain. This may be dull cramps or sharp pain. It may occur in one area, or it may be present in the entire abdomen. Pain can range from mild to severe, depending on the degree of obstruction. °· Nausea and vomiting. Vomit may be greenish or a yellow bile color. °· Abdominal bloating. °· Constipation. °· Lack of passing gas. °· Frequent belching. °· Diarrhea. This may occur if the obstruction is partial and runny stool is able to leak around the obstruction. °DIAGNOSIS °This condition may be diagnosed based on a physical exam, medical history, and X-rays of the abdomen. You may also have other tests, such as a CT scan of the abdomen and pelvis. °TREATMENT °Treatment for this condition depends on the cause and severity of the problem.  Treatment options may include: °· Bed rest along with fluids and pain medicines that are given through an IV tube inserted into one of your veins. Sometimes, this is all that is needed for the obstruction to improve. °· Following a simple diet. In some cases, a clear liquid diet may be required for several days. This allows the bowel to rest. °· Placement of a small tube (nasogastric tube) into the stomach. When the bowel is blocked, it usually swells up like a balloon that is filled with air and fluids. The air and fluids may be removed by suction through the nasogastric tube. This can help with pain, discomfort, and nausea. It can also help the obstruction to clear up faster. °· Surgery. This may be required if other treatments do not work. Bowel obstruction from a hernia may require early surgery and can be an emergency procedure. Surgery may also be required for scar tissue that causes frequent or severe obstructions. °HOME CARE INSTRUCTIONS °· Get plenty of rest. °· Follow instructions from your health care provider about eating restrictions. You may need to avoid solid foods and consume only clear liquids until your condition improves. °· Take over-the-counter and prescription medicines only as told by your health care provider. °· Keep all follow-up visits as told by your health care provider. This is important. °SEEK MEDICAL CARE IF: °· You have a fever. °· You have chills. °SEEK IMMEDIATE MEDICAL CARE IF: °· You have increased pain or cramping. °· You vomit blood. °· You have uncontrolled vomiting or nausea. °· You cannot drink   fluids because of vomiting or pain. °· You develop confusion. °· You begin feeling very dry or thirsty (dehydrated). °· You have severe bloating. °· You feel extremely weak or you faint. °  °This information is not intended to replace advice given to you by your health care provider. Make sure you discuss any questions you have with your health care provider. °  °Document Released:  09/03/2005 Document Revised: 03/08/2015 Document Reviewed: 08/11/2014 °[employer] Interactive Patient Education ©2016 [employer] ° ° °

## 2015-06-21 NOTE — Discharge Summary (Signed)
Physician Discharge Summary  [name] [id number] DOB: [birth year] DOA: 06/15/2015  PCP: Sallee Lange, MD   Admit date: 06/15/2015 Discharge date: 06/21/2015  Recommendations for Outpatient Follow-up:  1. No changes in medications on discharge 2. F/U with PCP per sch appt.   Discharge Diagnoses:  Principal Problem:   Partial small bowel obstruction (HCC) Active Problems:   Nausea and vomiting   Ileitis, regional, with intestinal obstruction   E-coli UTI   Controlled type 2 diabetes mellitus without complication, without long-term current use of insulin (HCC)   HLD (hyperlipidemia)   HTN (hypertension)   Leukocytosis    Discharge Condition: stable   Diet recommendation: as tolerated   History of present illness:  81 y.o. female with past medical history of hypertension, diabetes who presented to [facility] ED with reports of abdominal pain, nausea and vomiting for few days prior to the admission. She was found to have SBO and ileitis on admission. She has been seen by GI in consultation. She was started on rocephin and flagyl. She was also found to have E.Coli UTI. Because she had more abdominal distention 12/18, CT enterography done and it was actually normal.   Hospital Course:   Assessment/Plan:    Principal Problem:  Partial small bowel obstruction (HCC) / Abdominal pain / Nausea and vomiting / Ileitis  - CT abd On admission showed dilatation of small-bowel loops to 3.6 cm in maximal diameter with fecalization at the mid to distal ileum and a diffusely thick-walled segment of distal ileum distal to the fecalization compatible with acute infectious or inflammatory ileitis. Associated free fluid and mild mesenteric inflammation noted, findings compatible with partial obstruction due to ileitis. - Patient was on Rocephin and Flagyl. Flagyl stopped 06/19/2015. She is still on Rocephin for UTI. - CT enterography was negative. - She is on soft diet and tolerates well  without N/V - Had 2 BM in past 24 hours    Active Problems:  Diabetes mellitus without complication without long term insulin use (HCC) - Continue metformin on discharge    UTI (urinary tract infection) due to E.Coli / Leukocytosis  - Urine culture on this admission grew E.Coli - Continued rocephin through 12/21. Abx not needed on discharge    Essential hypertension - Continue Norvasc   Dyslipidemia associated with type 2 diabetes mellitus, goal LDL less than 70 - Continue gemfibrozil   DVT Prophylaxis  - Hep subQ in hospital    Code Status: Full.  Family Communication: plan of care discussed with the patient   IV access:  Peripheral IV  Procedures and diagnostic studies:   Ct Abdomen Pelvis W Contrast 06/15/2015 1. Dilatation of small-bowel loops to 3.6 cm in maximal diameter, with fecalization at the mid to distal ileum just below the umbilicus, and a diffusely thick-walled segment of distal ileum distal to the fecalization, compatible with acute infectious or inflammatory ileitis. Associated free fluid and mild mesenteric inflammation noted. More distal small bowel is decompressed. Findings compatible with partial obstruction due to ileitis. 2. Small amount of free fluid noted tracking about the liver and spleen. 3. Cholelithiasis. Gallbladder otherwise unremarkable. 4. Small hiatal hernia seen. 5. Diffuse calcification along the abdominal aorta and its branches.   Dg Abd 2 Views 06/16/2015 No evidence of bowel obstruction or ileus. Cholelithiasis.   Medical Consultants:  Gastroenterology  Other Consultants:  None   IAnti-Infectives:   Flagyl 1215/2016 --> 06/19/2015 Rocephin 06/15/2015 --> 06/21/2015  Signed:  Leisa Lenz, MD  [facility] 06/21/2015, 10:59  AM  Pager #: [phone]  Time spent in minutes: more than 30 minutes   Discharge Exam: Filed Vitals:   06/20/15 2220 06/21/15 0603  BP: 158/88 157/56  Pulse: 71  64  Temp: 98.1 F (36.7 C) 98.3 F (36.8 C)  Resp: 16 16   Filed Vitals:   06/20/15 0550 06/20/15 1452 06/20/15 2220 06/21/15 0603  BP: 137/55 148/66 158/88 157/56  Pulse: 58 64 71 64  Temp: 97.9 F (36.6 C) 97.7 F (36.5 C) 98.1 F (36.7 C) 98.3 F (36.8 C)  TempSrc: Oral Oral Oral Oral  Resp: 17 17 16 16   Height:      Weight:      SpO2: 98% 99% 98% 97%    General: Pt is alert, follows commands appropriately, not in acute distress Cardiovascular: Regular rate and rhythm, S1/S2 +, no murmurs Respiratory: Clear to auscultation bilaterally, no wheezing, no crackles, no rhonchi Abdominal: Soft, non tender, non distended, bowel sounds +, no guarding Extremities: no edema, no cyanosis, pulses palpable bilaterally DP and PT Neuro: Grossly nonfocal  Discharge Instructions  Discharge Instructions    Call MD for:  difficulty breathing, headache or visual disturbances    Complete by:  As directed      Call MD for:  persistant nausea and vomiting    Complete by:  As directed      Call MD for:  severe uncontrolled pain    Complete by:  As directed      Diet - low sodium heart healthy    Complete by:  As directed      Increase activity slowly    Complete by:  As directed             Medication List    TAKE these medications        ALPRAZolam 0.25 MG tablet  Commonly known as:  XANAX  TAKE ONE TABLET BY MOUTH AT BEDTIME     amLODipine 10 MG tablet  Commonly known as:  NORVASC  TAKE ONE TABLET BY MOUTH ONCE DAILY     aspirin 81 MG tablet  Take 81 mg by mouth daily.     beta carotene w/minerals tablet  Take 1 tablet by mouth daily.     cyanocobalamin 2000 MCG tablet  Take 2,000 mcg by mouth daily.     gemfibrozil 600 MG tablet  Commonly known as:  LOPID  TAKE ONE TABLET BY MOUTH ONCE DAILY     losartan-hydrochlorothiazide 100-25 MG tablet  Commonly known as:  HYZAAR  TAKE ONE TABLET BY MOUTH ONCE DAILY     metFORMIN 1000 MG tablet  Commonly known as:   GLUCOPHAGE  TAKE ONE TABLET BY MOUTH TWICE DAILY **INCREASED  DOSE**     multivitamin per tablet  Take 1 tablet by mouth daily.     polyvinyl alcohol 1.4 % ophthalmic solution  Commonly known as:  LIQUIFILM TEARS  Place 1 drop into both eyes 2 (two) times daily.     RED YEAST RICE PO  Take 1 capsule by mouth 2 (two) times daily.           Follow-up Information    Follow up with Sallee Lange, MD. Schedule an appointment as soon as possible for a visit in 1 week.   Specialty:  Family Medicine   Why:  Follow up appt after recent hospitalization   Contact information:   [address] [place]  [address] [phone]        The  results of significant diagnostics from this hospitalization (including imaging, microbiology, ancillary and laboratory) are listed below for reference.    Significant Diagnostic Studies: Ct Abdomen Pelvis W Contrast  06/15/2015  CLINICAL DATA:  Acute onset of lower abdominal pain and leukocytosis. Initial encounter. EXAM: CT ABDOMEN AND PELVIS WITH CONTRAST TECHNIQUE: Multidetector CT imaging of the abdomen and pelvis was performed using the standard protocol following bolus administration of intravenous contrast. CONTRAST:  113mL OMNIPAQUE IOHEXOL 300 MG/ML  SOLN COMPARISON:  CT of the abdomen and pelvis from 12/15/2014 FINDINGS: Minimal bibasilar atelectasis is noted. A small hiatal hernia is noted. A small amount of free fluid is noted tracking about the liver and spleen, likely reflecting the acute ileal process. The liver and spleen are unremarkable in appearance. Stones are noted dependently within the gallbladder. The gallbladder is otherwise unremarkable. The pancreas and adrenal glands are unremarkable. The kidneys are unremarkable in appearance. There is no evidence of hydronephrosis. No renal or ureteral stones are seen. No perinephric stranding is appreciated. There is dilatation of small bowel loops up to 3.6 cm in maximal diameter, with  fecalization noted at the mid to distal ileum at the mid abdomen, just below the umbilicus, and a diffusely thick walled segment of distal ileum distal to the fecalization, compatible with acute infectious or inflammatory ileitis. Associated free fluid and mild mesenteric inflammation are seen. More distal small bowel is decompressed. This reflects partial obstruction due to the ileitis. No free fluid is identified. The small bowel is unremarkable in appearance. The stomach is within normal limits. No acute vascular abnormalities are seen. Diffuse calcification is noted along the abdominal aorta and its branches. The patient is status post appendectomy. The colon is partially filled with stool and is unremarkable in appearance. The bladder is significantly distended and grossly unremarkable. The patient is status post hysterectomy. No suspicious adnexal masses are seen. No inguinal lymphadenopathy is seen. No acute osseous abnormalities are identified. IMPRESSION: 1. Dilatation of small-bowel loops to 3.6 cm in maximal diameter, with fecalization at the mid to distal ileum just below the umbilicus, and a diffusely thick-walled segment of distal ileum distal to the fecalization, compatible with acute infectious or inflammatory ileitis. Associated free fluid and mild mesenteric inflammation noted. More distal small bowel is decompressed. Findings compatible with partial obstruction due to ileitis. 2. Small amount of free fluid noted tracking about the liver and spleen. 3. Cholelithiasis.  Gallbladder otherwise unremarkable. 4. Small hiatal hernia seen. 5. Diffuse calcification along the abdominal aorta and its branches. Electronically Signed   By: Garald Balding M.D.   On: 06/15/2015 04:21   Ct Entero Abd/pelvis W/cm  06/18/2015  CLINICAL DATA:  Small obstruction, and question ileus, generalized abdominal tenderness for 4 days with nausea and vomiting, hypertension, diabetes mellitus, prior appendectomy,  hysterectomy and colon surgery (for diverticulitis) EXAM: CT ABDOMEN AND PELVIS WITH CONTRAST (ENTEROGRAPHY) TECHNIQUE: Multidetector CT of the abdomen and pelvis during bolus administration of intravenous contrast. Negative oral contrast VoLumen was given. Sagittal and coronal MPR images reconstructed from axial data set. CONTRAST:  137mL OMNIPAQUE IOHEXOL 300 MG/ML SOLN IV. Negative oral contrast administered. COMPARISON:  06/15/2015 FINDINGS: Minimal bibasilar atelectasis. Dependent gallstones in a mildly distended gallbladder. No CBD stones or biliary dilatation. 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 Liver, spleen, pancreas, kidneys, and adrenal glands otherwise normal. Scattered atherosclerotic calcifications. Cystocele, bladder otherwise unremarkable. Stomach and bowel loops grossly normal appearance. No focal bowel wall thickening, areas of abnormal mucosal enhancement, mass, or  dilatation. Prior ventral herniorrhaphy. No mass, adenopathy, free air, free fluid or inflammatory process. Bones demineralized. IMPRESSION: Cholelithiasis with minimally distended gallbladder ; no gallbladder wall thickening or biliary dilatation identified. Cystocele. Otherwise negative exam. Electronically Signed   By: Lavonia Dana M.D.   On: 06/18/2015 13:06   Dg Abd 2 Views  06/17/2015  CLINICAL DATA:  Abdominal pain EXAM: ABDOMEN - 2 VIEW COMPARISON:  06/16/2015 FINDINGS: Scattered large and small bowel gas is noted. No free intraperitoneal air is seen. No obstructive changes are noted. Postsurgical changes are seen. Multiple gallstones are noted in the right upper quadrant. No bony abnormality is noted. IMPRESSION: Cholelithiasis. No acute abnormality is noted. Electronically Signed   By: Inez Catalina M.D.   On: 06/17/2015 09:46   Dg Abd 2 Views  06/16/2015  CLINICAL DATA:  Generalized abdominal pain and distention. EXAM: ABDOMEN - 2 VIEW COMPARISON:  CT scan of June 15, 2015. FINDINGS: The bowel gas  pattern is normal. There is no evidence of free air. Residual contrast is noted in the colon. Status post surgical anterior abdominal wall hernia repair. Cholelithiasis is noted. IMPRESSION: No evidence of bowel obstruction or ileus.  Cholelithiasis. Electronically Signed   By: Marijo Conception, M.D.   On: 06/16/2015 08:39    Microbiology: Recent Results (from the past 240 hour(s))  Culture, Urine     Status: None   Collection Time: 06/15/15 10:31 AM  Result Value Ref Range Status   Specimen Description URINE, CLEAN CATCH  Final   Special Requests NONE  Final   Culture   Final    >=100,000 COLONIES/mL ESCHERICHIA COLI Performed at [facility]    Report Status 06/17/2015 FINAL  Final   Organism ID, Bacteria ESCHERICHIA COLI  Final      Susceptibility   Escherichia coli - MIC*    AMPICILLIN >=32 RESISTANT Resistant     CEFAZOLIN <=4 SENSITIVE Sensitive     CEFTRIAXONE <=1 SENSITIVE Sensitive     CIPROFLOXACIN <=0.25 SENSITIVE Sensitive     GENTAMICIN >=16 RESISTANT Resistant     IMIPENEM <=0.25 SENSITIVE Sensitive     NITROFURANTOIN <=16 SENSITIVE Sensitive     TRIMETH/SULFA >=320 RESISTANT Resistant     AMPICILLIN/SULBACTAM 16 INTERMEDIATE Intermediate     PIP/TAZO <=4 SENSITIVE Sensitive     * >=100,000 COLONIES/mL ESCHERICHIA COLI     Labs: Basic Metabolic Panel:  Recent Labs Lab 06/15/15 0151 06/16/15 0415 06/17/15 0943  NA 137 141 143  K 4.0 3.7 3.8  CL 100* 105 106  CO2 24 27 29   GLUCOSE 187* 121* 154*  BUN 34* 22* 12  CREATININE 0.99 0.89 0.85  CALCIUM 10.3 8.4* 9.3   Liver Function Tests:  Recent Labs Lab 06/15/15 0151  AST 24  ALT 19  ALKPHOS 101  BILITOT 0.9  PROT 8.1  ALBUMIN 4.8    Recent Labs Lab 06/15/15 0151  LIPASE 47   No results for input(s): AMMONIA in the last 168 hours. CBC:  Recent Labs Lab 06/15/15 0151 06/15/15 1511 06/16/15 0415 06/17/15 0943  WBC 13.8* 12.1* 6.3 5.4  NEUTROABS 11.3* 9.4* 3.9  --   HGB 14.0  12.2 11.0* 11.7*  HCT 42.1 37.6 33.3* 35.7*  MCV 91.7 94.0 94.1 93.5  PLT 341 294 255 273   Cardiac Enzymes: No results for input(s): CKTOTAL, CKMB, CKMBINDEX, TROPONINI in the last 168 hours. BNP: BNP (last 3 results) No results for input(s): BNP in the last 8760 hours.  ProBNP (last  3 results) No results for input(s): PROBNP in the last 8760 hours.  CBG:  Recent Labs Lab 06/15/15 0159 06/20/15 0816 06/20/15 1242 06/20/15 1723 06/21/15 0744  GLUCAP 171* 169* 113* 127* 115*

## 2015-06-21 NOTE — Progress Notes (Signed)
Patient tolerating regular diet well, passing gas with normal bowel movements.  Patient ambulating in halls.  Patient does not have any current s/s of of SBO or UTI.  Discharge instructions reviewed with patient.  Patient to be discharge to home, self care with her brother.

## 2015-06-27 ENCOUNTER — Ambulatory Visit (INDEPENDENT_AMBULATORY_CARE_PROVIDER_SITE_OTHER): Payer: Medicare Other | Admitting: Family Medicine

## 2015-06-27 ENCOUNTER — Encounter: Payer: Self-pay | Admitting: Family Medicine

## 2015-06-27 VITALS — BP 134/78 | Ht 68.0 in | Wt 165.2 lb

## 2015-06-27 DIAGNOSIS — K5669 Other intestinal obstruction: Secondary | ICD-10-CM | POA: Diagnosis not present

## 2015-06-27 DIAGNOSIS — I10 Essential (primary) hypertension: Secondary | ICD-10-CM

## 2015-06-27 DIAGNOSIS — K56600 Partial intestinal obstruction, unspecified as to cause: Secondary | ICD-10-CM

## 2015-06-27 NOTE — Progress Notes (Signed)
   Subjective:    Patient ID: [name], female    DOB: [birth year], 81 y.o.   MRN: [id number]  HPI Patient arrives for a follow up for a recent hospitalization for a partial bowel obstruction.  hospital x-rays lab work were reviewed in detail discussed with the patient as well. Overall patient doing fairly well now eating a soft diet tolerating it normal bowel movements no nausea vomiting or abdominal pain. Patient has significant amount of questions which were covered today greater than half the time was spent in discussion 15th 20 minutes spent with patient  Review of Systems  Constitutional: Negative for fever and fatigue.  HENT: Negative for congestion.   Respiratory: Negative for cough, chest tightness and shortness of breath.   Cardiovascular: Negative for chest pain.  Gastrointestinal: Negative for abdominal pain.       Objective:   Physical Exam  Constitutional: She appears well-nourished. No distress.  Cardiovascular: Normal rate, regular rhythm and normal heart sounds.   No murmur heard. Pulmonary/Chest: Effort normal and breath sounds normal. No respiratory distress.  Musculoskeletal: She exhibits no edema.  Lymphadenopathy:    She has no cervical adenopathy.  Neurological: She is alert. She exhibits normal muscle tone.  Psychiatric: Her behavior is normal.  Vitals reviewed.         Assessment & Plan:   partial small bowel obstruction resolved currently probably related to adhesions scan did not show any tumors.   Patient gradually getting her stamina back it may take up to 8 weeks. Follow-up in March for her diabetic check up.

## 2015-06-27 NOTE — Patient Instructions (Signed)
Amlodipine reduce to 1/2 tablet for now  If BP starts going high then resume a whole tablet  Call if any questions

## 2015-08-17 ENCOUNTER — Other Ambulatory Visit: Payer: Self-pay | Admitting: Family Medicine

## 2015-08-18 NOTE — Telephone Encounter (Signed)
May have this and 3 refills 

## 2015-08-26 DIAGNOSIS — J209 Acute bronchitis, unspecified: Secondary | ICD-10-CM | POA: Diagnosis not present

## 2015-08-31 ENCOUNTER — Encounter: Payer: Self-pay | Admitting: Family Medicine

## 2015-08-31 ENCOUNTER — Ambulatory Visit (INDEPENDENT_AMBULATORY_CARE_PROVIDER_SITE_OTHER): Payer: Medicare Other | Admitting: Family Medicine

## 2015-08-31 VITALS — Temp 97.5°F | Ht 68.0 in | Wt 163.2 lb

## 2015-08-31 DIAGNOSIS — J200 Acute bronchitis due to Mycoplasma pneumoniae: Secondary | ICD-10-CM | POA: Diagnosis not present

## 2015-08-31 DIAGNOSIS — J019 Acute sinusitis, unspecified: Secondary | ICD-10-CM | POA: Diagnosis not present

## 2015-08-31 DIAGNOSIS — R5383 Other fatigue: Secondary | ICD-10-CM | POA: Diagnosis not present

## 2015-08-31 MED ORDER — AZITHROMYCIN 250 MG PO TABS: ORAL_TABLET | ORAL | Status: DC

## 2015-08-31 NOTE — Progress Notes (Signed)
   Subjective:    Patient ID: [name], female    DOB: [birth year], 82 y.o.   MRN: [id number]  Cough This is a new problem. The current episode started in the past 7 days. Associated symptoms include nasal congestion. Associated symptoms comments: fatigue.    symptoms over the past 45 days head congestion drainage coughing not feeling good low energy low-grade fevers denies difficulty breathing in the lungs relates a lot of head congestion drainage no vomiting or diarrhea   Review of Systems  Respiratory: Positive for cough.    see above     Objective:   Physical Exam  no crackles no respiratory distress D cough noted head congestion noted no fever or chills PMH benign       Assessment & Plan:   patient does not appear toxic Lab work ordered because she feels so fatigued  Viral syndrome with secondary rhinosinusitis  antibiotics prescribed warning signs discuss

## 2015-09-01 LAB — CBC WITH DIFFERENTIAL/PLATELET
BASOS: 0 %
Basophils Absolute: 0 10*3/uL (ref 0.0–0.2)
EOS (ABSOLUTE): 0.2 10*3/uL (ref 0.0–0.4)
EOS: 2 %
HEMATOCRIT: 39.1 % (ref 34.0–46.6)
HEMOGLOBIN: 13.7 g/dL (ref 11.1–15.9)
IMMATURE GRANS (ABS): 0 10*3/uL (ref 0.0–0.1)
Immature Granulocytes: 0 %
LYMPHS: 29 %
Lymphocytes Absolute: 2.7 10*3/uL (ref 0.7–3.1)
MCH: 30.3 pg (ref 26.6–33.0)
MCHC: 35 g/dL (ref 31.5–35.7)
MCV: 87 fL (ref 79–97)
Monocytes Absolute: 0.8 10*3/uL (ref 0.1–0.9)
Monocytes: 8 %
NEUTROS ABS: 5.8 10*3/uL (ref 1.4–7.0)
Neutrophils: 61 %
PLATELETS: 443 10*3/uL — AB (ref 150–379)
RBC: 4.52 x10E6/uL (ref 3.77–5.28)
RDW: 13.7 % (ref 12.3–15.4)
WBC: 9.5 10*3/uL (ref 3.4–10.8)

## 2015-09-01 LAB — BASIC METABOLIC PANEL
BUN/Creatinine Ratio: 33 — ABNORMAL HIGH (ref 11–26)
BUN: 33 mg/dL — AB (ref 8–27)
CALCIUM: 10.6 mg/dL — AB (ref 8.7–10.3)
CHLORIDE: 88 mmol/L — AB (ref 96–106)
CO2: 25 mmol/L (ref 18–29)
Creatinine, Ser: 1.01 mg/dL — ABNORMAL HIGH (ref 0.57–1.00)
GFR calc non Af Amer: 52 mL/min/{1.73_m2} — ABNORMAL LOW (ref >59)
GFR, EST AFRICAN AMERICAN: 60 mL/min/{1.73_m2} (ref >59)
Glucose: 120 mg/dL — ABNORMAL HIGH (ref 65–99)
POTASSIUM: 3.8 mmol/L (ref 3.5–5.2)
Sodium: 135 mmol/L (ref 134–144)

## 2015-09-07 ENCOUNTER — Telehealth: Payer: Self-pay | Admitting: Family Medicine

## 2015-09-07 DIAGNOSIS — E119 Type 2 diabetes mellitus without complications: Secondary | ICD-10-CM

## 2015-09-07 DIAGNOSIS — E785 Hyperlipidemia, unspecified: Secondary | ICD-10-CM

## 2015-09-07 DIAGNOSIS — I10 Essential (primary) hypertension: Secondary | ICD-10-CM

## 2015-09-07 NOTE — Telephone Encounter (Signed)
Blood work ordered in EPIC. Patient notified. 

## 2015-09-07 NOTE — Telephone Encounter (Signed)
Lipid, liver, metabolic 7, [id number], urine ACR-please tell the patient to do the labs fasting plus she will also need to give a urine

## 2015-09-07 NOTE — Telephone Encounter (Signed)
bw orders please for appt next Thursday   Last labs 08/31/15 CBC BMP

## 2015-09-08 DIAGNOSIS — E119 Type 2 diabetes mellitus without complications: Secondary | ICD-10-CM | POA: Diagnosis not present

## 2015-09-08 DIAGNOSIS — E785 Hyperlipidemia, unspecified: Secondary | ICD-10-CM | POA: Diagnosis not present

## 2015-09-08 DIAGNOSIS — I10 Essential (primary) hypertension: Secondary | ICD-10-CM | POA: Diagnosis not present

## 2015-09-09 LAB — HEPATIC FUNCTION PANEL
ALBUMIN: 4.6 g/dL (ref 3.5–4.7)
ALT: 13 IU/L (ref 0–32)
AST: 16 IU/L (ref 0–40)
Alkaline Phosphatase: 112 IU/L (ref 39–117)
BILIRUBIN TOTAL: 0.3 mg/dL (ref 0.0–1.2)
BILIRUBIN, DIRECT: 0.1 mg/dL (ref 0.00–0.40)
TOTAL PROTEIN: 6.9 g/dL (ref 6.0–8.5)

## 2015-09-09 LAB — BASIC METABOLIC PANEL
BUN / CREAT RATIO: 31 — AB (ref 11–26)
BUN: 29 mg/dL — AB (ref 8–27)
CALCIUM: 9.8 mg/dL (ref 8.7–10.3)
CO2: 25 mmol/L (ref 18–29)
CREATININE: 0.94 mg/dL (ref 0.57–1.00)
Chloride: 99 mmol/L (ref 96–106)
GFR, EST AFRICAN AMERICAN: 65 mL/min/{1.73_m2} (ref >59)
GFR, EST NON AFRICAN AMERICAN: 57 mL/min/{1.73_m2} — AB (ref >59)
Glucose: 131 mg/dL — ABNORMAL HIGH (ref 65–99)
Potassium: 5 mmol/L (ref 3.5–5.2)
Sodium: 141 mmol/L (ref 134–144)

## 2015-09-09 LAB — LIPID PANEL
CHOL/HDL RATIO: 3.4 ratio (ref 0.0–4.4)
Cholesterol, Total: 171 mg/dL (ref 100–199)
HDL: 51 mg/dL (ref >39)
LDL Calculated: 87 mg/dL (ref 0–99)
TRIGLYCERIDES: 163 mg/dL — AB (ref 0–149)
VLDL Cholesterol Cal: 33 mg/dL (ref 5–40)

## 2015-09-09 LAB — MICROALBUMIN / CREATININE URINE RATIO
CREATININE, UR: 46.1 mg/dL
MICROALB/CREAT RATIO: 8 mg/g{creat} (ref 0.0–30.0)
MICROALBUM., U, RANDOM: 3.7 ug/mL

## 2015-09-09 LAB — HEMOGLOBIN A1C
Est. average glucose Bld gHb Est-mCnc: 148 mg/dL
Hgb A1c MFr Bld: 6.8 % — ABNORMAL HIGH (ref 4.8–5.6)

## 2015-09-14 ENCOUNTER — Encounter: Payer: Self-pay | Admitting: Family Medicine

## 2015-09-14 ENCOUNTER — Ambulatory Visit (INDEPENDENT_AMBULATORY_CARE_PROVIDER_SITE_OTHER): Payer: Medicare Other | Admitting: Family Medicine

## 2015-09-14 VITALS — BP 130/76 | Ht 68.0 in | Wt 168.0 lb

## 2015-09-14 DIAGNOSIS — L570 Actinic keratosis: Secondary | ICD-10-CM | POA: Diagnosis not present

## 2015-09-14 DIAGNOSIS — I10 Essential (primary) hypertension: Secondary | ICD-10-CM

## 2015-09-14 DIAGNOSIS — E119 Type 2 diabetes mellitus without complications: Secondary | ICD-10-CM

## 2015-09-14 DIAGNOSIS — E785 Hyperlipidemia, unspecified: Secondary | ICD-10-CM | POA: Diagnosis not present

## 2015-09-14 NOTE — Progress Notes (Signed)
   Subjective:    Patient ID: [name], female    DOB: [birth year], 82 y.o.   MRN: [id number]  Diabetes She presents for her follow-up diabetic visit. She has type 2 diabetes mellitus. Pertinent negatives for hypoglycemia include no confusion. Pertinent negatives for diabetes include no chest pain, no fatigue, no polydipsia, no polyphagia and no weakness. Eye exam is not current.  Patient states she does try to watch her diet. Takes her blood pressure medicine regular basis states his been under good control She also states she takes red rice yeast extract as well as gemfibrozil tolerating it well watching diet closely She is taking her metformin and doing well with watching the starches and sugars in her diet.  Patient has concerns to dry skin to nose. States this has been scaling some irritated some  Had recent labs drawn on 09/08/15  Review of Systems  Constitutional: Negative for activity change, appetite change and fatigue.  HENT: Negative for congestion.   Respiratory: Negative for cough.   Cardiovascular: Negative for chest pain.  Gastrointestinal: Negative for abdominal pain.  Endocrine: Negative for polydipsia and polyphagia.  Neurological: Negative for weakness.  Psychiatric/Behavioral: Negative for confusion.       Objective:   Physical Exam  Constitutional: She appears well-nourished. No distress.  Cardiovascular: Normal rate, regular rhythm and normal heart sounds.   No murmur heard. Pulmonary/Chest: Effort normal and breath sounds normal. No respiratory distress.  Musculoskeletal: She exhibits no edema.  Lymphadenopathy:    She has no cervical adenopathy.  Neurological: She is alert. She exhibits normal muscle tone.  Psychiatric: Her behavior is normal.  Vitals reviewed.    Patient has a red and nares scaling on her nose I recommend dermatology referral     Assessment & Plan:  Diabetes good control currently continue current measures Hyperlipidemia very  good control triglycerides slightly up watch diet Blood pressure very good continue current measures Actinic keratosis referral to dermatology Recent pneumonia resolved.

## 2015-09-21 DIAGNOSIS — L570 Actinic keratosis: Secondary | ICD-10-CM | POA: Diagnosis not present

## 2015-09-21 DIAGNOSIS — X32XXXA Exposure to sunlight, initial encounter: Secondary | ICD-10-CM | POA: Diagnosis not present

## 2015-09-21 DIAGNOSIS — D225 Melanocytic nevi of trunk: Secondary | ICD-10-CM | POA: Diagnosis not present

## 2015-09-28 DIAGNOSIS — H353110 Nonexudative age-related macular degeneration, right eye, stage unspecified: Secondary | ICD-10-CM | POA: Diagnosis not present

## 2015-09-28 LAB — HM DIABETES EYE EXAM

## 2015-10-04 ENCOUNTER — Other Ambulatory Visit: Payer: Self-pay | Admitting: Family Medicine

## 2015-10-30 LAB — HM DIABETES EYE EXAM

## 2015-11-23 ENCOUNTER — Other Ambulatory Visit: Payer: Self-pay | Admitting: Family Medicine

## 2015-11-23 DIAGNOSIS — Z1231 Encounter for screening mammogram for malignant neoplasm of breast: Secondary | ICD-10-CM

## 2015-12-04 ENCOUNTER — Other Ambulatory Visit: Payer: Self-pay | Admitting: Family Medicine

## 2015-12-04 ENCOUNTER — Ambulatory Visit (HOSPITAL_COMMUNITY)
Admission: RE | Admit: 2015-12-04 | Discharge: 2015-12-04 | Disposition: A | Discharge status: Home / Self Care | Payer: Medicare Other | Source: Ambulatory Visit | Attending: Family Medicine | Admitting: Family Medicine

## 2015-12-04 ENCOUNTER — Ambulatory Visit (HOSPITAL_COMMUNITY): Payer: Medicare Other

## 2015-12-04 DIAGNOSIS — Z1231 Encounter for screening mammogram for malignant neoplasm of breast: Secondary | ICD-10-CM

## 2015-12-11 ENCOUNTER — Other Ambulatory Visit: Payer: Self-pay | Admitting: Family Medicine

## 2015-12-19 DIAGNOSIS — L600 Ingrowing nail: Secondary | ICD-10-CM | POA: Diagnosis not present

## 2015-12-19 DIAGNOSIS — B351 Tinea unguium: Secondary | ICD-10-CM | POA: Diagnosis not present

## 2015-12-30 ENCOUNTER — Other Ambulatory Visit: Payer: Self-pay | Admitting: Family Medicine

## 2016-01-02 ENCOUNTER — Other Ambulatory Visit: Payer: Self-pay | Admitting: Family Medicine

## 2016-01-09 DIAGNOSIS — B351 Tinea unguium: Secondary | ICD-10-CM | POA: Diagnosis not present

## 2016-01-10 ENCOUNTER — Telehealth: Payer: Self-pay | Admitting: Family Medicine

## 2016-01-10 NOTE — Telephone Encounter (Signed)
Last labs 09/08/15 Lipid, liver, bmp, a1c, microalb, cbc

## 2016-01-10 NOTE — Telephone Encounter (Signed)
Requesting order for blood work.  She would like to have this done tomorrow.

## 2016-01-10 NOTE — Telephone Encounter (Signed)
Pt.notified

## 2016-01-10 NOTE — Telephone Encounter (Signed)
Just A1C do in office

## 2016-01-15 ENCOUNTER — Ambulatory Visit (INDEPENDENT_AMBULATORY_CARE_PROVIDER_SITE_OTHER): Payer: Medicare Other | Admitting: Family Medicine

## 2016-01-15 ENCOUNTER — Encounter: Payer: Self-pay | Admitting: Family Medicine

## 2016-01-15 VITALS — BP 134/72 | Ht 68.0 in | Wt 167.1 lb

## 2016-01-15 DIAGNOSIS — Z79899 Other long term (current) drug therapy: Secondary | ICD-10-CM | POA: Diagnosis not present

## 2016-01-15 DIAGNOSIS — E119 Type 2 diabetes mellitus without complications: Secondary | ICD-10-CM | POA: Diagnosis not present

## 2016-01-15 LAB — POCT GLYCOSYLATED HEMOGLOBIN (HGB A1C): Hemoglobin A1C: 5.8

## 2016-01-15 MED ORDER — AMLODIPINE BESYLATE 10 MG PO TABS: 10.0000 mg | ORAL_TABLET | Freq: Every day | ORAL | Status: DC

## 2016-01-15 MED ORDER — LOSARTAN POTASSIUM-HCTZ 100-25 MG PO TABS: 1.0000 | ORAL_TABLET | Freq: Every day | ORAL | Status: DC

## 2016-01-15 NOTE — Progress Notes (Signed)
   Subjective:    Patient ID: [name], female    DOB: Oct 30, 1931, 82 y.o.   MRN: [id number]  Diabetes She presents for her follow-up diabetic visit. She has type 2 diabetes mellitus. Pertinent negatives for hypoglycemia include no confusion. Pertinent negatives for diabetes include no chest pain, no fatigue, no polydipsia, no polyphagia and no weakness. She sees a podiatrist.Eye exam is current.   Patient has concerns of lower energy.  Results for orders placed or performed in visit on 01/15/16  POCT HgB A1C  Result Value Ref Range   Hemoglobin A1C 5.8      Review of Systems  Constitutional: Negative for activity change, appetite change and fatigue.  HENT: Negative for congestion.   Respiratory: Negative for cough.   Cardiovascular: Negative for chest pain.  Gastrointestinal: Negative for abdominal pain.  Endocrine: Negative for polydipsia and polyphagia.  Neurological: Negative for weakness.  Psychiatric/Behavioral: Negative for confusion.       Objective:   Physical Exam  Constitutional: She appears well-nourished. No distress.  Cardiovascular: Normal rate, regular rhythm and normal heart sounds.   No murmur heard. Pulmonary/Chest: Effort normal and breath sounds normal. No respiratory distress.  Musculoskeletal: She exhibits no edema.  Lymphadenopathy:    She has no cervical adenopathy.  Neurological: She is alert. She exhibits normal muscle tone.  Psychiatric: Her behavior is normal.  Vitals reviewed.   Diabetic foot exam slight neuropathy significant bunions noted with pre-ulcerative calcified areas      Assessment & Plan:  Diabetes-under very good control. Patient caution to avoid low sugar spells. Check lab work. Tolerating medication well. May adjust medicine depending on what lab shows otherwise follow-up 4 months more comprehensive lab work at that time also recommend the patient stop gemfibrozil. This does not necessarily benefit her she will watch the  fats and starches in her diet.

## 2016-01-16 ENCOUNTER — Other Ambulatory Visit: Payer: Self-pay

## 2016-01-16 DIAGNOSIS — Z79899 Other long term (current) drug therapy: Secondary | ICD-10-CM

## 2016-01-16 LAB — BASIC METABOLIC PANEL
BUN/Creatinine Ratio: 33 — ABNORMAL HIGH (ref 12–28)
BUN: 27 mg/dL (ref 8–27)
CALCIUM: 10.5 mg/dL — AB (ref 8.7–10.3)
CO2: 25 mmol/L (ref 18–29)
CREATININE: 0.82 mg/dL (ref 0.57–1.00)
Chloride: 95 mmol/L — ABNORMAL LOW (ref 96–106)
GFR, EST AFRICAN AMERICAN: 77 mL/min/{1.73_m2} (ref >59)
GFR, EST NON AFRICAN AMERICAN: 67 mL/min/{1.73_m2} (ref >59)
Glucose: 131 mg/dL — ABNORMAL HIGH (ref 65–99)
Potassium: 4.3 mmol/L (ref 3.5–5.2)
Sodium: 141 mmol/L (ref 134–144)

## 2016-01-22 ENCOUNTER — Ambulatory Visit (INDEPENDENT_AMBULATORY_CARE_PROVIDER_SITE_OTHER): Payer: Medicare Other | Admitting: Otolaryngology

## 2016-01-22 DIAGNOSIS — H900 Conductive hearing loss, bilateral: Secondary | ICD-10-CM

## 2016-01-22 DIAGNOSIS — H6123 Impacted cerumen, bilateral: Secondary | ICD-10-CM

## 2016-01-24 DIAGNOSIS — M1711 Unilateral primary osteoarthritis, right knee: Secondary | ICD-10-CM | POA: Diagnosis not present

## 2016-01-29 IMAGING — US US BREAST LTD UNI LEFT INC AXILLA
1 series · 9 of 9 positions shown · non-contrast
Comparison: With priors.

CLINICAL DATA: Short-term interval followup of probable benign
nodules in the left breast.

EXAM:
DIGITAL DIAGNOSTIC BILATERAL MAMMOGRAM WITH 3D TOMOSYNTHESIS WITH
CAD
ULTRASOUND LEFT BREAST

[Series 1: us breast ltd uni left inc axilla · 0.07mm/px · 9 of 9 slices shown]
[im 1/9]
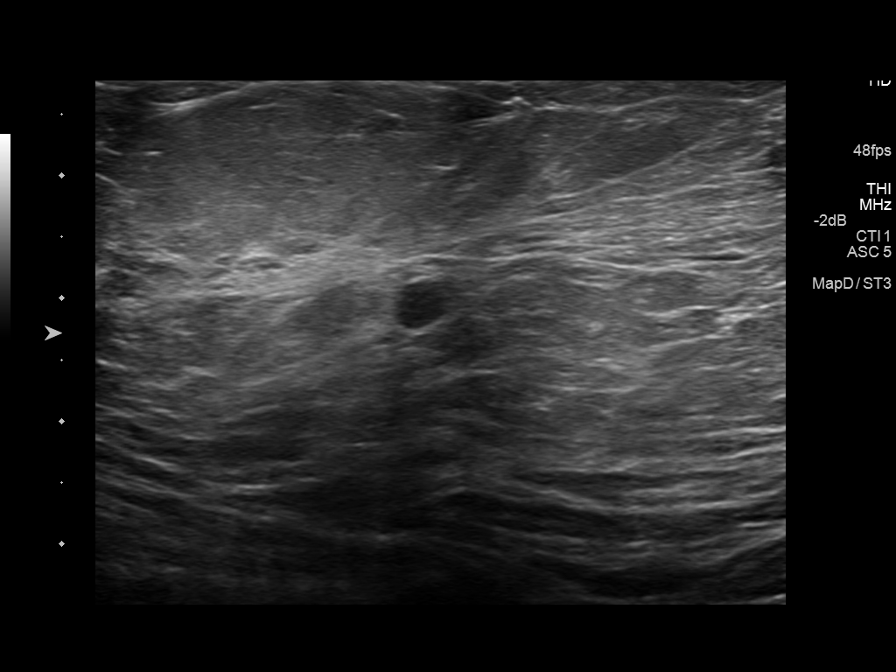
[im 2/9]
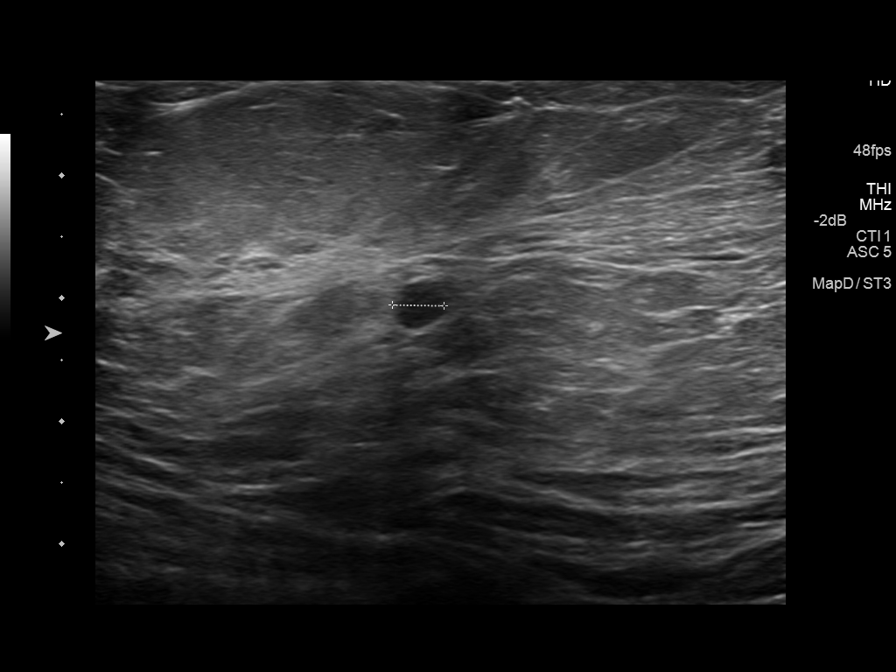
[im 3/9]
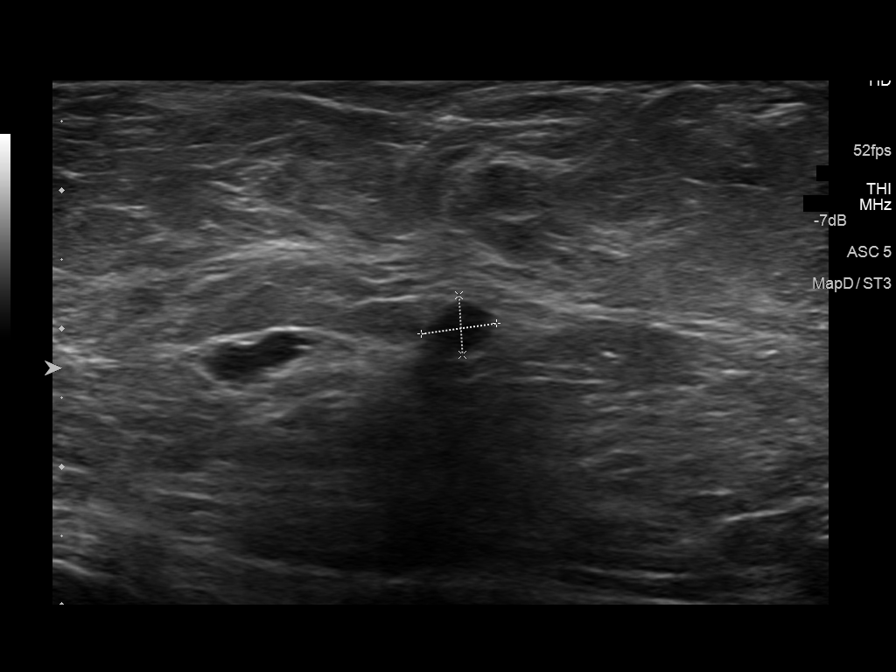
[im 4/9]
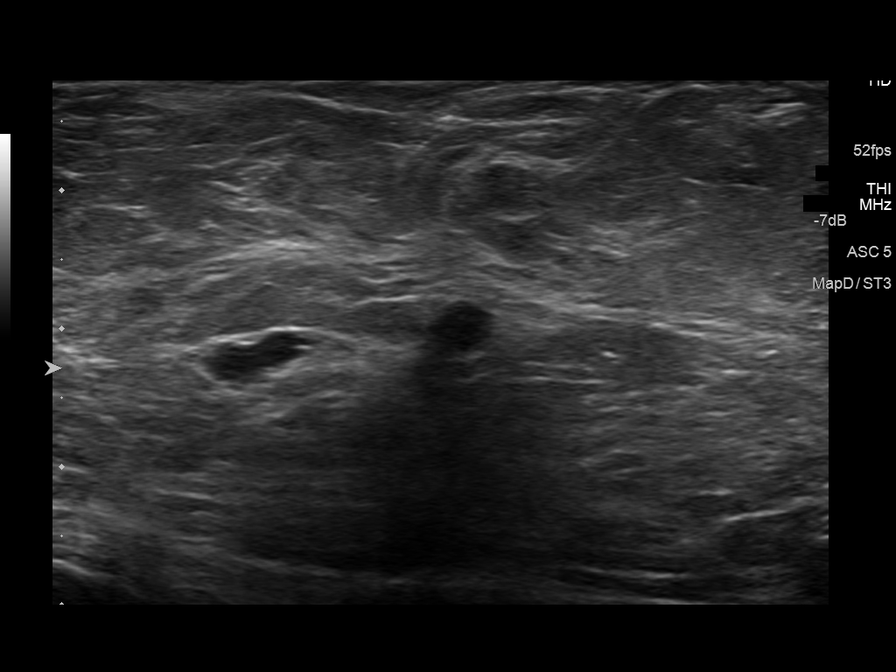
[im 5/9]
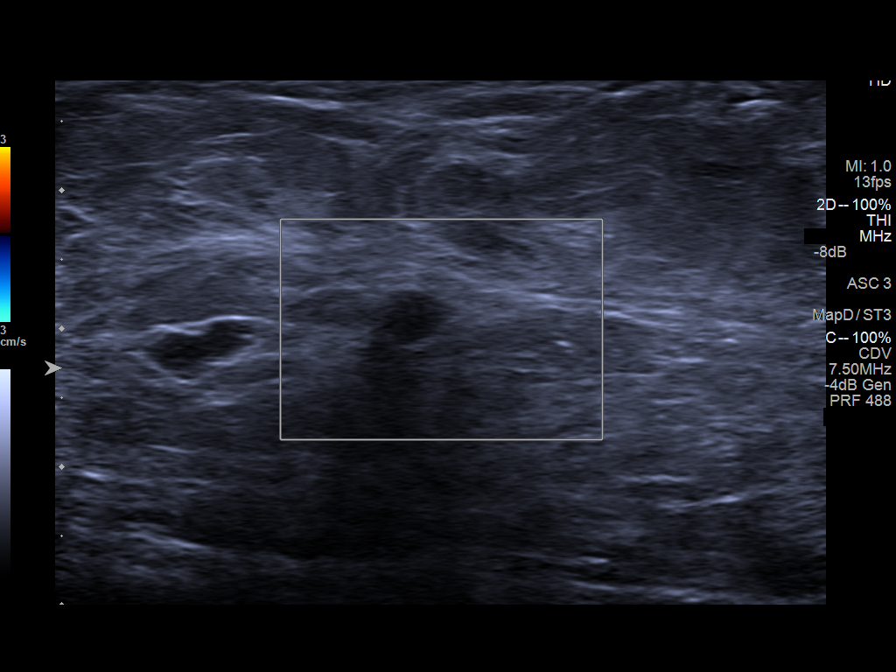
[im 6/9]
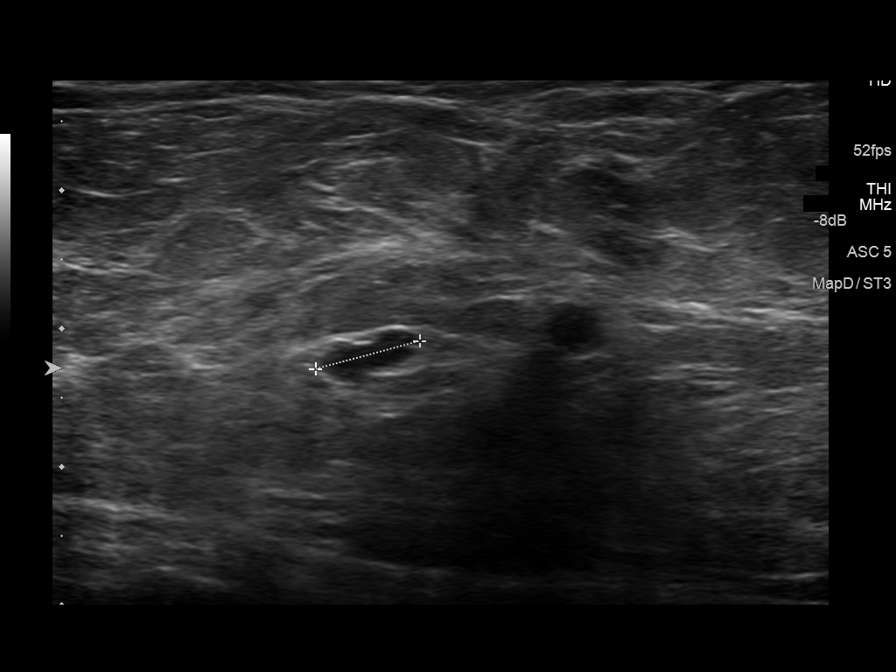
[im 7/9]
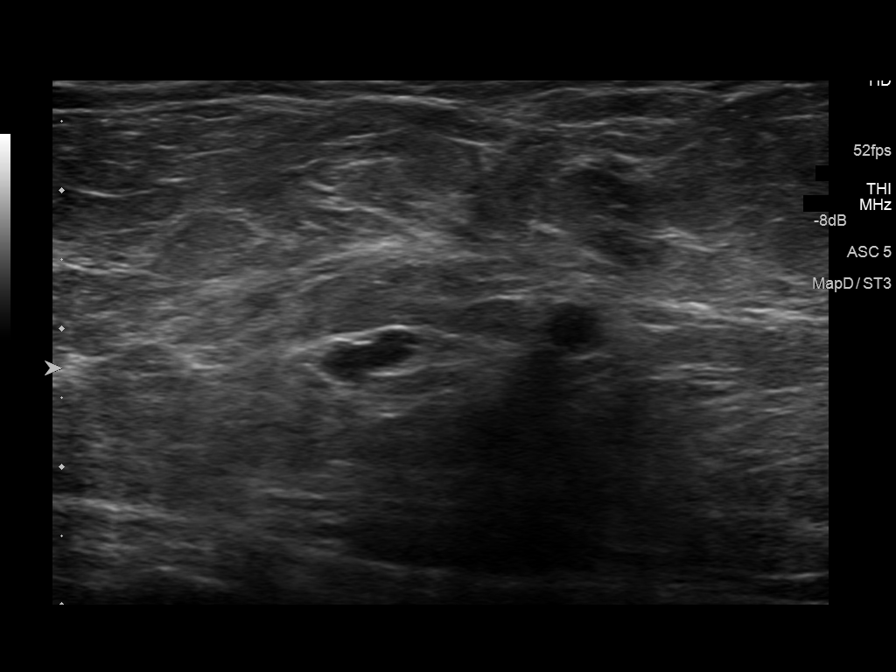
[im 8/9]
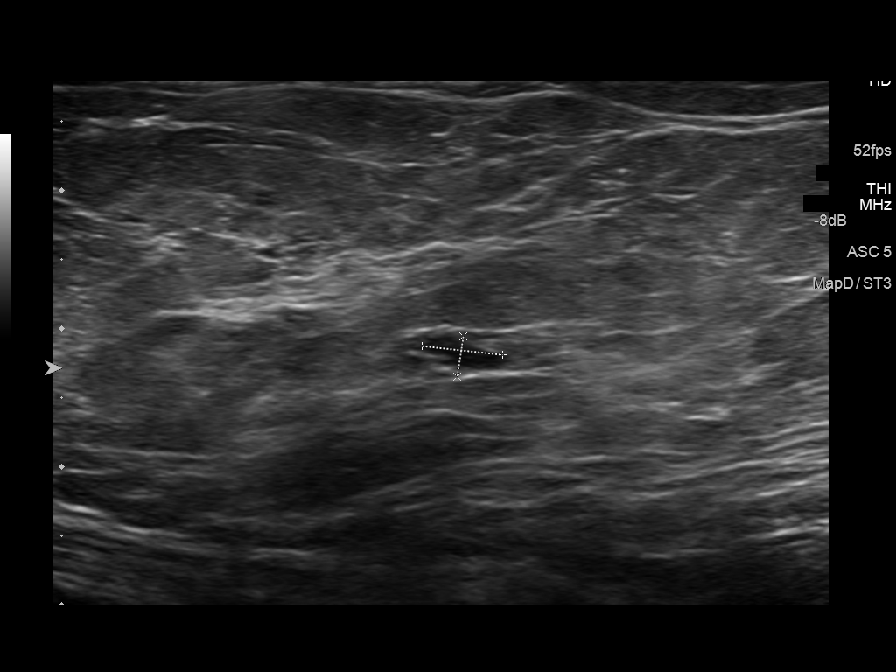
[im 9/9]
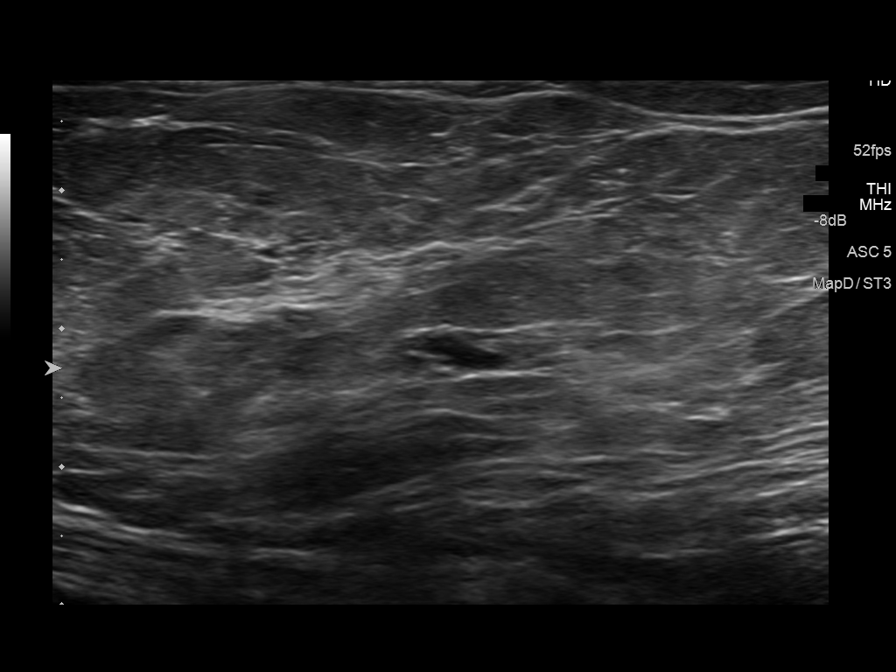

[9 of 9 positions shown; findings below may reference images not displayed]

ACR Breast Density Category b: There are scattered areas of
fibroglandular density.
FINDINGS: There are 2 low-density nodules in the medial aspect of the left
breast. They measure 8 mm and 6 mm. The more medial nodule is
smaller than the 10/28/2013 study. No new suspicious mass or
malignant type microcalcifications detected in either breast.

Mammographic images were processed with CAD.

On physical exam, I do not palpate a mass in the medial aspect of
the left breast.

Targeted ultrasound is performed, showing a well-circumscribed
hypoechoic nodule in the left breast at 9 o'clock 4 cm from the
nipple measuring 4 x 6 x 4 mm. On the prior ultrasound dated
05/31/2014 it measured 6 x 6 x 6 mm and on the prior ultrasound
dated 11/17/2013 it measured 6 x 5 x 9 mm. Adjacent to this is an
oval shaped similar area measuring 8 x 6 x 3 mm. On the prior
ultrasound dated 05/31/2014 it measured 7 x 7 x 2 mm a on the
ultrasound dated 11/17/2013 measured 7 x 6 x 7 mm.
IMPRESSION: Benign appearing lesions in the medial aspect of the left breast. No
evidence of malignancy in either breast.

RECOMMENDATION:
Bilateral screening mammogram in 1 year is recommended.

I have discussed the findings and recommendations with the patient.
Results were also provided in writing at the conclusion of the
visit. If applicable, a reminder letter will be sent to the patient
regarding the next appointment.

BI-RADS CATEGORY  2: Benign.

## 2016-01-31 DIAGNOSIS — M1711 Unilateral primary osteoarthritis, right knee: Secondary | ICD-10-CM | POA: Diagnosis not present

## 2016-02-07 DIAGNOSIS — M1711 Unilateral primary osteoarthritis, right knee: Secondary | ICD-10-CM | POA: Diagnosis not present

## 2016-02-12 DIAGNOSIS — Z79899 Other long term (current) drug therapy: Secondary | ICD-10-CM | POA: Diagnosis not present

## 2016-02-13 ENCOUNTER — Encounter: Payer: Self-pay | Admitting: Family Medicine

## 2016-02-13 LAB — BASIC METABOLIC PANEL
BUN / CREAT RATIO: 30 — AB (ref 12–28)
BUN: 25 mg/dL (ref 8–27)
CO2: 27 mmol/L (ref 18–29)
CREATININE: 0.83 mg/dL (ref 0.57–1.00)
Calcium: 10 mg/dL (ref 8.7–10.3)
Chloride: 98 mmol/L (ref 96–106)
GFR calc Af Amer: 76 mL/min/{1.73_m2} (ref >59)
GFR calc non Af Amer: 66 mL/min/{1.73_m2} (ref >59)
GLUCOSE: 130 mg/dL — AB (ref 65–99)
POTASSIUM: 4.4 mmol/L (ref 3.5–5.2)
SODIUM: 141 mmol/L (ref 134–144)

## 2016-02-22 ENCOUNTER — Encounter: Payer: Self-pay | Admitting: Family Medicine

## 2016-02-22 ENCOUNTER — Ambulatory Visit (INDEPENDENT_AMBULATORY_CARE_PROVIDER_SITE_OTHER): Payer: Medicare Other | Admitting: Family Medicine

## 2016-02-22 VITALS — BP 170/84 | HR 108 | Temp 97.9°F | Ht 68.0 in | Wt 171.4 lb

## 2016-02-22 DIAGNOSIS — R0600 Dyspnea, unspecified: Secondary | ICD-10-CM

## 2016-02-22 DIAGNOSIS — R609 Edema, unspecified: Secondary | ICD-10-CM | POA: Diagnosis not present

## 2016-02-22 DIAGNOSIS — R000 Tachycardia, unspecified: Secondary | ICD-10-CM | POA: Diagnosis not present

## 2016-02-22 MED ORDER — POTASSIUM CHLORIDE ER 10 MEQ PO TBCR: 10.0000 meq | EXTENDED_RELEASE_TABLET | Freq: Every day | ORAL | 3 refills | Status: DC

## 2016-02-22 MED ORDER — FUROSEMIDE 20 MG PO TABS: 20.0000 mg | ORAL_TABLET | Freq: Every day | ORAL | 3 refills | Status: DC

## 2016-02-22 NOTE — Progress Notes (Signed)
   Subjective:    Patient ID: [name], female    DOB: Dec 18, 1931, 82 y.o.   MRN: [id number]  HPI Patient is here today for ankle swelling, high blood pressure and increased heart rate. Onset 7 days ago.  Treatments tried: decreased salt intake with no relief.   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 Tends to follow her heart rate closely. Normally. Notes it's been consistently up the past several weeks. Next  In the same time patient has noted swelling in her ankles. No orthopnea. No chest pain. No shortness of breath. Does note some fatigue.  Claims that swelling in the ankles has not been a significant problem in the past       Pulse has beenup in the recent measurements  Pt received injectio in the knee,  Pt has tried savon patches  Patient has no other concerns at this time.  Review of Systems No headache, no major weight loss or weight gain, no chest pain no back pain abdominal pain no change in bowel habits complete ROS otherwise negative     Objective:   Physical Exam Alert talkative no acute distress heart rate near 100 vital stable lungs clear heart regular in rhythm ankles 1+ edema.   EKG normal sinus rhythm/borderline sinus tachycardia. Concerning for loss of rwave progression V1 through V4, substantial Q waves present    Assessment & Plan:  Impression tachycardia with swelling of ankles. Accompanied by an EKG which highly suggests old anterior MI. Plan initiate Lasix and potassium. Check a BNP. Cardiology referral. Patient normally on 81 mg aspirin daily so encouraged to increase to 325 mg. Warning signs discussed. We'll need further tests via the, cardiologist

## 2016-02-22 NOTE — Patient Instructions (Signed)
Increase your aspirin to 325 mg each day  Add lasix 20 mg one each morn   Add pot 10 meq one daily   continu other blood pressure ankles  We will work on ref to dr Domenic Polite as soon as possible

## 2016-02-23 ENCOUNTER — Telehealth: Payer: Self-pay | Admitting: Family Medicine

## 2016-02-23 LAB — BRAIN NATRIURETIC PEPTIDE: BNP: 51.8 pg/mL (ref 0.0–100.0)

## 2016-02-23 NOTE — Telephone Encounter (Signed)
Appointment scheduled with Dr.Nishan at [facility] on 02/29/16 @ 2:15 pm.

## 2016-02-23 NOTE — Telephone Encounter (Signed)
Patient notified of appointment and verbalized understanding.

## 2016-02-23 NOTE — Telephone Encounter (Signed)
Thanks a lot guys! Ice cream next wk

## 2016-02-24 ENCOUNTER — Emergency Department (HOSPITAL_COMMUNITY): Payer: Medicare Other

## 2016-02-24 ENCOUNTER — Encounter (HOSPITAL_COMMUNITY): Payer: Self-pay | Admitting: Emergency Medicine

## 2016-02-24 ENCOUNTER — Observation Stay (HOSPITAL_COMMUNITY)
Admission: EM | Admit: 2016-02-24 | Discharge: 2016-02-25 | Disposition: A | Discharge status: Home / Self Care | Payer: Medicare Other | Attending: Internal Medicine | Admitting: Internal Medicine

## 2016-02-24 DIAGNOSIS — R002 Palpitations: Secondary | ICD-10-CM | POA: Diagnosis not present

## 2016-02-24 DIAGNOSIS — I700 Atherosclerosis of aorta: Secondary | ICD-10-CM

## 2016-02-24 DIAGNOSIS — R0609 Other forms of dyspnea: Secondary | ICD-10-CM

## 2016-02-24 DIAGNOSIS — E119 Type 2 diabetes mellitus without complications: Secondary | ICD-10-CM | POA: Diagnosis not present

## 2016-02-24 DIAGNOSIS — Z794 Long term (current) use of insulin: Secondary | ICD-10-CM | POA: Diagnosis not present

## 2016-02-24 DIAGNOSIS — Z7982 Long term (current) use of aspirin: Secondary | ICD-10-CM | POA: Diagnosis not present

## 2016-02-24 DIAGNOSIS — I10 Essential (primary) hypertension: Secondary | ICD-10-CM | POA: Insufficient documentation

## 2016-02-24 DIAGNOSIS — Z79899 Other long term (current) drug therapy: Secondary | ICD-10-CM | POA: Diagnosis not present

## 2016-02-24 DIAGNOSIS — R5383 Other fatigue: Secondary | ICD-10-CM | POA: Diagnosis not present

## 2016-02-24 DIAGNOSIS — E1151 Type 2 diabetes mellitus with diabetic peripheral angiopathy without gangrene: Secondary | ICD-10-CM

## 2016-02-24 DIAGNOSIS — R0602 Shortness of breath: Secondary | ICD-10-CM | POA: Diagnosis not present

## 2016-02-24 HISTORY — DX: Type 2 diabetes mellitus without complications: E11.9

## 2016-02-24 LAB — BRAIN NATRIURETIC PEPTIDE: B NATRIURETIC PEPTIDE 5: 24.9 pg/mL (ref 0.0–100.0)

## 2016-02-24 LAB — BASIC METABOLIC PANEL
ANION GAP: 13 (ref 5–15)
BUN: 25 mg/dL — ABNORMAL HIGH (ref 6–20)
CO2: 27 mmol/L (ref 22–32)
Calcium: 11.1 mg/dL — ABNORMAL HIGH (ref 8.9–10.3)
Chloride: 96 mmol/L — ABNORMAL LOW (ref 101–111)
Creatinine, Ser: 1.06 mg/dL — ABNORMAL HIGH (ref 0.44–1.00)
GFR calc Af Amer: 55 mL/min — ABNORMAL LOW (ref >60)
GFR, EST NON AFRICAN AMERICAN: 48 mL/min — AB (ref >60)
GLUCOSE: 155 mg/dL — AB (ref 65–99)
POTASSIUM: 3.6 mmol/L (ref 3.5–5.1)
Sodium: 136 mmol/L (ref 135–145)

## 2016-02-24 LAB — CBC
HEMATOCRIT: 42.2 % (ref 36.0–46.0)
HEMOGLOBIN: 14.1 g/dL (ref 12.0–15.0)
MCH: 29.9 pg (ref 26.0–34.0)
MCHC: 33.4 g/dL (ref 30.0–36.0)
MCV: 89.6 fL (ref 78.0–100.0)
Platelets: 302 10*3/uL (ref 150–400)
RBC: 4.71 MIL/uL (ref 3.87–5.11)
RDW: 12.7 % (ref 11.5–15.5)
WBC: 10.7 10*3/uL — ABNORMAL HIGH (ref 4.0–10.5)

## 2016-02-24 LAB — DIFFERENTIAL
BASOS ABS: 0 10*3/uL (ref 0.0–0.1)
Basophils Relative: 0 %
Eosinophils Absolute: 0.2 10*3/uL (ref 0.0–0.7)
Eosinophils Relative: 2 %
LYMPHS PCT: 13 %
Lymphs Abs: 1.4 10*3/uL (ref 0.7–4.0)
MONO ABS: 0.8 10*3/uL (ref 0.1–1.0)
Monocytes Relative: 7 %
NEUTROS ABS: 8.4 10*3/uL — AB (ref 1.7–7.7)
NEUTROS PCT: 78 %

## 2016-02-24 LAB — MAGNESIUM: MAGNESIUM: 1.5 mg/dL — AB (ref 1.7–2.4)

## 2016-02-24 LAB — TROPONIN I: Troponin I: <0.03 ng/mL (ref <0.03)

## 2016-02-24 LAB — I-STAT TROPONIN, ED: Troponin i, poc: 0 ng/mL (ref 0.00–0.08)

## 2016-02-24 LAB — GLUCOSE, CAPILLARY: GLUCOSE-CAPILLARY: 133 mg/dL — AB (ref 65–99)

## 2016-02-24 MED ORDER — ASPIRIN EC 81 MG PO TBEC
81.0000 mg | DELAYED_RELEASE_TABLET | Freq: Every day | ORAL | Status: DC
  Administered 2016-02-25: 81 mg via ORAL
  Filled 2016-02-24: qty 1

## 2016-02-24 MED ORDER — HYDROCODONE-ACETAMINOPHEN 5-325 MG PO TABS: 1.0000 | ORAL_TABLET | ORAL | Status: DC | PRN

## 2016-02-24 MED ORDER — LOSARTAN POTASSIUM 50 MG PO TABS
100.0000 mg | ORAL_TABLET | Freq: Every day | ORAL | Status: DC
  Administered 2016-02-25: 100 mg via ORAL
  Filled 2016-02-24: qty 2

## 2016-02-24 MED ORDER — ACETAMINOPHEN 650 MG RE SUPP
650.0000 mg | Freq: Four times a day (QID) | RECTAL | Status: DC | PRN
Start: 2016-02-24 — End: 2016-02-25

## 2016-02-24 MED ORDER — ENOXAPARIN SODIUM 40 MG/0.4ML ~~LOC~~ SOLN
40.0000 mg | SUBCUTANEOUS | Status: DC
  Administered 2016-02-24: 40 mg via SUBCUTANEOUS
  Filled 2016-02-24: qty 0.4

## 2016-02-24 MED ORDER — ONDANSETRON HCL 4 MG/2ML IJ SOLN: 4.0000 mg | Freq: Four times a day (QID) | INTRAMUSCULAR | Status: DC | PRN

## 2016-02-24 MED ORDER — ONDANSETRON HCL 4 MG PO TABS: 4.0000 mg | ORAL_TABLET | Freq: Four times a day (QID) | ORAL | Status: DC | PRN

## 2016-02-24 MED ORDER — TRAZODONE HCL 50 MG PO TABS
25.0000 mg | ORAL_TABLET | Freq: Every evening | ORAL | Status: DC | PRN
  Administered 2016-02-25: 25 mg via ORAL
  Filled 2016-02-24: qty 1

## 2016-02-24 MED ORDER — MAGNESIUM SULFATE 2 GM/50ML IV SOLN
2.0000 g | Freq: Once | INTRAVENOUS | Status: AC
  Administered 2016-02-24: 2 g via INTRAVENOUS
  Filled 2016-02-24: qty 50

## 2016-02-24 MED ORDER — POLYETHYLENE GLYCOL 3350 17 G PO PACK: 17.0000 g | PACK | Freq: Every day | ORAL | Status: DC | PRN

## 2016-02-24 MED ORDER — LOSARTAN POTASSIUM-HCTZ 100-25 MG PO TABS: 1.0000 | ORAL_TABLET | Freq: Every day | ORAL | Status: DC

## 2016-02-24 MED ORDER — ACETAMINOPHEN 325 MG PO TABS
650.0000 mg | ORAL_TABLET | Freq: Four times a day (QID) | ORAL | Status: DC | PRN
Start: 2016-02-24 — End: 2016-02-25

## 2016-02-24 MED ORDER — AMLODIPINE BESYLATE 10 MG PO TABS
10.0000 mg | ORAL_TABLET | Freq: Every day | ORAL | Status: DC
  Administered 2016-02-25: 10 mg via ORAL
  Filled 2016-02-24: qty 1

## 2016-02-24 MED ORDER — HYDROCHLOROTHIAZIDE 25 MG PO TABS
25.0000 mg | ORAL_TABLET | Freq: Every day | ORAL | Status: DC
  Administered 2016-02-25: 25 mg via ORAL
  Filled 2016-02-24: qty 1

## 2016-02-24 MED ORDER — BISACODYL 5 MG PO TBEC
5.0000 mg | DELAYED_RELEASE_TABLET | Freq: Every day | ORAL | Status: DC | PRN
  Filled 2016-02-24: qty 1

## 2016-02-24 NOTE — ED Provider Notes (Signed)
[facility] DEPT Provider Note   CSN: [id number] Arrival date & time: 02/24/16  1045     History   Chief Complaint Chief Complaint  Patient presents with  . Palpitations    HPI [name] is a 82 y.o. female.  HPI 82 year old female who presents with palpitations and shortness of breath. She has a history of hypertension and diabetes. States that for the past week she has become more fatigued, noticing more dyspnea and fatigue with minimal exertion and with her daily activities. She has had intermittent palpitations with this and noted increase in her blood pressure at home and increase in her heart rate 90s to low 100s. He was seen by her primary care doctor, Dr. Wolfgang Phoenix a few days ago for this she also developed some pedal edema. Has been taking Lasix which is new. Swelling has subsided, but she continues to feel fatigue, shortness of breath with activity, and palpitations. No significant chest pain, syncope or near syncope. No cough, fevers, or chills. No nausea or vomiting. BP very elevated this morning after taking her medications. Came to ED for evaluation.   Past Medical History:  Diagnosis Date  . Diabetes mellitus without complication ([place])   . Hypertension     Patient Active Problem List   Diagnosis Date Noted  . Dyspnea on exertion 02/24/2016  . Other fatigue   . Hyperlipidemia 09/14/2015  . E-coli UTI 06/18/2015  . Partial small bowel obstruction ([place]) 06/15/2015  . Ileitis, regional, with intestinal obstruction 06/15/2015  . Leukocytosis 12/16/2014  . Nausea and vomiting 12/15/2014  . HLD (hyperlipidemia) 12/15/2014  . HTN (hypertension) 12/15/2014  . Controlled type 2 diabetes mellitus without complication, without long-term current use of insulin ([place]) 02/15/2011    Past Surgical History:  Procedure Laterality Date  . ABDOMINAL HYSTERECTOMY    . APPENDECTOMY    . BREAST REDUCTION SURGERY    . CARDIAC CATHETERIZATION  06/01/2003   EF is between  60-65% -- Smooth and normal coronary arteries -- Normal left ventricular systolic function  -- We will continue with medical therapy for her hypertensio  . COLON SURGERY     left  . EYE SURGERY  03/30/2009   cataract  . KNEE SURGERY    . LAPAROSCOPIC INCISIONAL / UMBILICAL / VENTRAL HERNIA REPAIR  02/23/2007   . REFRACTIVE SURGERY      OB History    No data available       Home Medications    Prior to Admission medications   Medication Sig Start Date End Date Taking? Authorizing Provider  amLODipine (NORVASC) 10 MG tablet Take 1 tablet (10 mg total) by mouth daily. 01/15/16  Yes Kathyrn Drown, MD  aspirin 81 MG tablet Take 81 mg by mouth daily.     Yes Historical Provider, MD  beta carotene w/minerals (OCUVITE) tablet Take 1 tablet by mouth daily.   Yes Historical Provider, MD  cyanocobalamin 2000 MCG tablet Take 2,000 mcg by mouth daily.   Yes Historical Provider, MD  furosemide (LASIX) 20 MG tablet Take 1 tablet (20 mg total) by mouth daily. 02/22/16  Yes Mikey Kirschner, MD  losartan-hydrochlorothiazide (HYZAAR) 100-25 MG tablet Take 1 tablet by mouth daily. 01/15/16  Yes Kathyrn Drown, MD  metFORMIN (GLUCOPHAGE) 1000 MG tablet TAKE ONE TABLET BY MOUTH TWICE DAILY **INCREASED  DOSE** 10/04/15  Yes Kathyrn Drown, MD  multivitamin [facility]) per tablet Take 1 tablet by mouth daily.     Yes Historical Provider, MD  polyvinyl alcohol (LIQUIFILM TEARS) 1.4 % ophthalmic solution Place 1 drop into both eyes 2 (two) times daily.   Yes Historical Provider, MD  potassium chloride (K-DUR) 10 MEQ tablet Take 1 tablet (10 mEq total) by mouth daily. 02/22/16  Yes Mikey Kirschner, MD  Red Yeast Rice Extract (RED YEAST RICE PO) Take 1 capsule by mouth 2 (two) times daily.     Yes Historical Provider, MD  ALPRAZolam Duanne Moron) 0.25 MG tablet TAKE ONE TABLET BY MOUTH ONCE DAILY AT BEDTIME Patient not taking: Reported on 01/15/2016 08/18/15   Kathyrn Drown, MD    Family History Family History    Problem Relation Age of Onset  . Hypertension Father     kidney  . Stroke Father   . Cancer Father   . Cancer Mother     ovaian  . Diabetes Brother   . Hypertension Sister     Social History Social History  Substance Use Topics  . Smoking status: Never Smoker  . Smokeless tobacco: Never Used  . Alcohol use No     Allergies   Phenergan [promethazine hcl]; Prednisone; and Statins   Review of Systems Review of Systems 10/14 systems reviewed and are negative other than those stated in the HPI   Physical Exam Updated Vital Signs BP (!) 124/53 (BP Location: Left Arm)   Pulse 74   Temp 98 F (36.7 C) (Oral)   Resp 18   Ht 5\' 8"  (1.727 m)   Wt 167 lb (75.8 kg) Comment: a scale  SpO2 93%   BMI 25.39 kg/m   Physical Exam Physical Exam  Nursing note and vitals reviewed. Constitutional: Well developed, well nourished, non-toxic, and in no acute distress Head: Normocephalic and atraumatic.  Mouth/Throat: Oropharynx is clear and moist.  Neck: Normal range of motion. Neck supple.  Cardiovascular: Normal rate and regular rhythm.   Pulmonary/Chest: Effort normal and breath sounds normal.  Abdominal: Soft. There is no tenderness. There is no rebound and no guarding.  Musculoskeletal: Normal range of motion. no edema. Neurological: Alert, no facial droop, fluent speech, moves all extremities symmetrically Skin: Skin is warm and dry.  Psychiatric: Cooperative   ED Treatments / Results  Labs (all labs ordered are listed, but only abnormal results are displayed) Labs Reviewed  BASIC METABOLIC PANEL - Abnormal; Notable for the following:       Result Value   Chloride 96 (*)    Glucose, Bld 155 (*)    BUN 25 (*)    Creatinine, Ser 1.06 (*)    Calcium 11.1 (*)    GFR calc non Af Amer 48 (*)    GFR calc Af Amer 55 (*)    All other components within normal limits  CBC - Abnormal; Notable for the following:    WBC 10.7 (*)    All other components within normal limits   DIFFERENTIAL - Abnormal; Notable for the following:    Neutro Abs 8.4 (*)    All other components within normal limits  MAGNESIUM - Abnormal; Notable for the following:    Magnesium 1.5 (*)    All other components within normal limits  BASIC METABOLIC PANEL - Abnormal; Notable for the following:    Potassium 3.4 (*)    Chloride 96 (*)    Glucose, Bld 137 (*)    BUN 26 (*)    GFR calc non Af Amer 57 (*)    All other components within normal limits  GLUCOSE, CAPILLARY - Abnormal; Notable  for the following:    Glucose-Capillary 133 (*)    All other components within normal limits  GLUCOSE, CAPILLARY - Abnormal; Notable for the following:    Glucose-Capillary 141 (*)    All other components within normal limits  GLUCOSE, CAPILLARY - Abnormal; Notable for the following:    Glucose-Capillary 118 (*)    All other components within normal limits  MRSA PCR SCREENING  BRAIN NATRIURETIC PEPTIDE  CBC  TROPONIN I  TROPONIN I  TROPONIN I  HEMOGLOBIN A1C  I-STAT TROPOININ, ED    EKG  EKG Interpretation  Date/Time:  Saturday February 24 2016 10:52:16 EDT Ventricular Rate:  77 PR Interval:    QRS Duration: 90 QT Interval:  380 QTC Calculation: 430 R Axis:   45 Text Interpretation:  Sinus rhythm Anterolateral infarct, old Baseline wander in lead(s) V3 Confirmed by LIU MD, DANA [id number]) on 02/24/2016 10:58:34 AM Also confirmed by LIU MD, DANA [phone]), editor Duncan, Joelene Millin [phone])  on 02/24/2016 12:07:32 PM       Radiology Dg Chest 2 View  Result Date: 02/24/2016 CLINICAL DATA:  Shortness of breath, palpitations EXAM: CHEST  2 VIEW COMPARISON:  06/19/2014 FINDINGS: Bibasilar linear scarring in the lung bases, stable. Mild hyperinflation. Heart is upper limits normal in size. No effusions or acute bony abnormality. IMPRESSION: Bibasilar scarring, stable.  Stable mild hyperinflation. Electronically Signed   By: Rolm Baptise M.D.   On: 02/24/2016 11:51    Procedures Procedures  (including critical care time)  Medications Ordered in ED Medications  amLODipine (NORVASC) tablet 10 mg (10 mg Oral Given 02/25/16 1055)  aspirin EC tablet 81 mg (81 mg Oral Given 02/25/16 1055)  acetaminophen (TYLENOL) tablet 650 mg (not administered)    Or  acetaminophen (TYLENOL) suppository 650 mg (not administered)  traZODone (DESYREL) tablet 25 mg (25 mg Oral Given 02/25/16 0022)  polyethylene glycol (MIRALAX / GLYCOLAX) packet 17 g (not administered)  bisacodyl (DULCOLAX) EC tablet 5 mg (not administered)  ondansetron (ZOFRAN) tablet 4 mg (not administered)    Or  ondansetron (ZOFRAN) injection 4 mg (not administered)  enoxaparin (LOVENOX) injection 40 mg (40 mg Subcutaneous Given 02/24/16 2219)  HYDROcodone-acetaminophen (NORCO/VICODIN) 5-325 MG per tablet 1-2 tablet (not administered)  losartan (COZAAR) tablet 100 mg (100 mg Oral Given 02/25/16 1054)  hydrochlorothiazide (HYDRODIURIL) tablet 25 mg (25 mg Oral Given 02/25/16 1054)  insulin aspart (novoLOG) injection 0-9 Units (0 Units Subcutaneous Not Given 02/25/16 1100)  magnesium sulfate IVPB 2 g 50 mL (0 g Intravenous Stopped 02/24/16 1537)     Initial Impression / Assessment and Plan / ED Course  I have reviewed the triage vital signs and the nursing notes.  Pertinent labs & imaging results that were available during my care of the patient were reviewed by me and considered in my medical decision making (see chart for details).  Clinical Course    82 year old with history of hypertension and diabetes who presents with dyspnea on exertion, fatigue and palpitations over the past week. Has outpatient cardiology appointment as a new patient Thursday. Blood pressure minimally hypertensive in the ED, and her symptoms are currently not present on exam. She appears euvolemic. Cardiopulmonary exam is overall unremarkable. Initial troponin is negative, EKG is without stigmata of arrhythmia and without acute ischemic changes. Chest x-ray  showing no acute cardiopulmonary processes aside from mild hyperinflation. Given her age and increased risk for acute cardiac issues, I discussed this patient with hospitalist service. They'll admit her for serial troponins, potential echo,  and rule out ACS versus arrhythmia.  Final Clinical Impressions(s) / ED Diagnoses   Final diagnoses:  Dyspnea on exertion  Other fatigue    New Prescriptions Current Discharge Medication List       Forde Dandy, MD 02/25/16 [phone]

## 2016-02-24 NOTE — H&P (Signed)
History and Physical    [name] [id number] DOB: 1931-11-13 DOA: 02/24/2016  PCP: Sallee Lange, MD  Patient coming from:   Home    Chief Complaint: shortness of breath and fluttering in chest.   HPI: [name] is a 82 y.o. female with a medical history significant for diabetes and HTN.  Patient presents to ED with a two week history of fatigue/dyspnea on exertion. Patient is relatively healthy, lives alone in fairly active but now cannot walk to the mailbox without coming short of breath. Patient noticed that her blood pressure and heart rate were elevated several days ago. She also developed some bilateral lower extremity swelling several days ago. Patient saw PCP and was given Lasix and scheduled to see a cardiologist. Today was her second dose of Lasix, swelling has already resolved but patient continues to have dyspnea on exertion and intermittent fluttering in her chest. No orthopnea. No chest pain . Had a dry cough yesterday but otherwise no coughing. No fevers. Chest x-ray unremarkable. Other than Lasix patient has not recently started any new medications.   ED Course:  Afebrile, normotensive, normal sinus rhythm, normal oxygen saturation on room air Wbc 10 point 7, hemoglobin 14, BUN 25, creatinine 1.06, calcium 11, BNP 51 Magnesium 2 g IV Chest x-ray by basilar scarring and stable mild hyperinflation   Review of Systems: As per HPI, otherwise 10 point review of systems negative.    Past Medical History:  Diagnosis Date  . Diabetes mellitus without complication ([place])   . Hypertension     Past Surgical History:  Procedure Laterality Date  . ABDOMINAL HYSTERECTOMY    . APPENDECTOMY    . BREAST REDUCTION SURGERY    . CARDIAC CATHETERIZATION  06/01/2003   EF is between 60-65% -- Smooth and normal coronary arteries -- Normal left ventricular systolic function  -- We will continue with medical therapy for her hypertensio  . COLON SURGERY     left  . EYE SURGERY   03/30/2009   cataract  . KNEE SURGERY    . LAPAROSCOPIC INCISIONAL / UMBILICAL / VENTRAL HERNIA REPAIR  02/23/2007   . REFRACTIVE SURGERY      Social History   Social History  . Marital status: Widowed    Spouse name: N/A  . Number of children: N/A  . Years of education: N/A   Occupational History  . Not on file.   Social History Main Topics  . Smoking status: Never Smoker  . Smokeless tobacco: Never Used  . Alcohol use No  . Drug use: No  . Sexual activity: Not on file   Other Topics Concern  . Not on file   Social History Narrative  . No narrative on file   Patient lives at home alone. No assistive devices needed for ambulation. She has 1 son named Buyer, retail. Does not smoke, she does not consume alcohol  Allergies  Allergen Reactions  . Phenergan [Promethazine Hcl]     hallucinations  . Prednisone     rash  . Statins     Severe myalgias    Family History  Problem Relation Age of Onset  . Hypertension Father     kidney  . Stroke Father   . Cancer Father   . Cancer Mother     ovaian  . Diabetes Brother   . Hypertension Sister     Prior to Admission medications   Medication Sig Start Date End Date Taking? Authorizing Provider  amLODipine (NORVASC) 10  MG tablet Take 1 tablet (10 mg total) by mouth daily. 01/15/16  Yes Kathyrn Drown, MD  aspirin 81 MG tablet Take 81 mg by mouth daily.     Yes Historical Provider, MD  beta carotene w/minerals (OCUVITE) tablet Take 1 tablet by mouth daily.   Yes Historical Provider, MD  cyanocobalamin 2000 MCG tablet Take 2,000 mcg by mouth daily.   Yes Historical Provider, MD  furosemide (LASIX) 20 MG tablet Take 1 tablet (20 mg total) by mouth daily. 02/22/16  Yes Mikey Kirschner, MD  losartan-hydrochlorothiazide (HYZAAR) 100-25 MG tablet Take 1 tablet by mouth daily. 01/15/16  Yes Kathyrn Drown, MD  metFORMIN (GLUCOPHAGE) 1000 MG tablet TAKE ONE TABLET BY MOUTH TWICE DAILY **INCREASED  DOSE** 10/04/15  Yes Kathyrn Drown, MD    multivitamin [facility]) per tablet Take 1 tablet by mouth daily.     Yes Historical Provider, MD  polyvinyl alcohol (LIQUIFILM TEARS) 1.4 % ophthalmic solution Place 1 drop into both eyes 2 (two) times daily.   Yes Historical Provider, MD  potassium chloride (K-DUR) 10 MEQ tablet Take 1 tablet (10 mEq total) by mouth daily. 02/22/16  Yes Mikey Kirschner, MD  Red Yeast Rice Extract (RED YEAST RICE PO) Take 1 capsule by mouth 2 (two) times daily.     Yes Historical Provider, MD  ALPRAZolam Duanne Moron) 0.25 MG tablet TAKE ONE TABLET BY MOUTH ONCE DAILY AT BEDTIME Patient not taking: Reported on 01/15/2016 08/18/15   Kathyrn Drown, MD    Physical Exam: Vitals:   02/24/16 1500 02/24/16 1530 02/24/16 1600 02/24/16 1630  BP: 144/78 (!) 131/52 (!) 154/43 (!) 146/53  Pulse: 83 83 85 95  Resp: 18 18 21 25   Temp:      TempSrc:      SpO2: 94% 96% 94% 95%  Weight:      Height:        Constitutional:  Pleasant, well-developed white female in NAD, calm, comfortable Vitals:   02/24/16 1500 02/24/16 1530 02/24/16 1600 02/24/16 1630  BP: 144/78 (!) 131/52 (!) 154/43 (!) 146/53  Pulse: 83 83 85 95  Resp: 18 18 21 25   Temp:      TempSrc:      SpO2: 94% 96% 94% 95%  Weight:      Height:       Eyes: PER, lids and conjunctivae normal ENMT: Mucous membranes are moist. Posterior pharynx clear of any exudate or lesions..  Neck: normal, supple, no masses Respiratory: clear to auscultation bilaterally, no wheezing, no crackles. Normal respiratory effort. No accessory muscle use.  Cardiovascular: Regular rate and rhythm, no murmurs / rubs / gallops. No extremity edema. 2+ dorsal pedis pulses.   Abdomen: no tenderness, no masses palpated. No hepatomegaly. Bowel sounds positive.  Musculoskeletal: no clubbing / cyanosis. No joint deformity upper and lower extremities. Good ROM, no contractures. Normal muscle tone.  Skin: no rashes, lesions, ulcers.  Neurologic: CN 2-12 grossly intact. Sensation intact,  Strength 5/5 in all 4.  Psychiatric: Normal judgment and insight. Alert and oriented x 3. Normal mood.   Labs on Admission: I have personally reviewed following labs and imaging studies  Radiological Exams on Admission: Dg Chest 2 View  Result Date: 02/24/2016 CLINICAL DATA:  Shortness of breath, palpitations EXAM: CHEST  2 VIEW COMPARISON:  06/19/2014 FINDINGS: Bibasilar linear scarring in the lung bases, stable. Mild hyperinflation. Heart is upper limits normal in size. No effusions or acute bony abnormality. IMPRESSION: Bibasilar scarring, stable.  Stable mild hyperinflation. Electronically Signed   By: Rolm Baptise M.D.   On: 02/24/2016 11:51    EKG: Independently reviewed.   EKG Interpretation  Date/Time:  Saturday February 24 2016 10:52:16 EDT Ventricular Rate:  77 PR Interval:    QRS Duration: 90 QT Interval:  380 QTC Calculation: 430 R Axis:   45 Text Interpretation:  Sinus rhythm Anterolateral infarct, old Baseline wander in lead(s) V3 Confirmed by LIU MD, DANA [id number]) on 02/24/2016 10:58:34 AM Also confirmed by LIU MD, DANA [phone]), editor Rolla Plate, Joelene Millin [phone])  on 02/24/2016 12:07:32 PM       Assessment/Plan   Active Problems:   Controlled type 2 diabetes mellitus without complication, without long-term current use of insulin (HCC)   DOE (dyspnea on exertion)     Dyspnea on exertion / fluttering in chest. CXR unrealving except mild hyperinflation. Rule out arrhythmia. With new onset bilateral lower extremity swelling need to evaluate for heart failure.  -Place in OBS - tele. .       -POC troponin negative, will obtain serial troponins -echocardiogram -If symptoms improve and above studies unremarkable then she can probably just follow up with Cardiology outpatient (has new patient appt on Thursday).   Hypercalcemia, mild at 11.1. Also mild increase in BUN / Cr. She may be slightly dry. Encouarge PO intake but hold off on any IV fluids for now (getting echo to evaluate  for heart failure)  Hypomagnesemia. Given 2gms IV Mg+ in ED  DM2, controlled. A1c mid July was 5.8 -hold home glucophage. sensitive SSI  HTN, controlled Continue home norvasc, Hyzaar  ? CAD, EKG suggests old anterolateral infarct.  DVT prophylaxis:   Lovenox   Code Status:    Limited code / DNI Family Communication:    Treatment plan discussed with son [name] in the room and he understands and agrees with the plan..  Disposition Plan:   Discharge  Home in 24-48 hours              Consults called:  None  Admission status:   Observation -  Telemetry    Tye Savoy NP [facility] Pager [phone]  If 7PM-7AM, please contact night-coverage [email or web address] Password [facility]  02/24/2016, 4:59 PM

## 2016-02-24 NOTE — ED Triage Notes (Signed)
Pt c/o not feeling well for past 2 weeks. Pt was seen by her PMD and prescribed lasix. Pt talked to the triage nurse from her Dr office and was told to take her lasix. Pt took her lasix about 45 mins PTA. Pt reports that she had swelling to B/L feet and ankles. No swelling noted at this time. Pt denies shortness of breath.

## 2016-02-24 NOTE — ED Notes (Signed)
Attempted report x1. 

## 2016-02-24 NOTE — ED Notes (Signed)
Admitting MD at bedside.

## 2016-02-24 NOTE — ED Notes (Signed)
Dinner tray now at bedside 

## 2016-02-24 NOTE — ED Notes (Signed)
Pt provided 3 pillows for comfort

## 2016-02-25 ENCOUNTER — Observation Stay (HOSPITAL_COMMUNITY): Payer: Medicare Other

## 2016-02-25 DIAGNOSIS — R002 Palpitations: Secondary | ICD-10-CM | POA: Diagnosis not present

## 2016-02-25 DIAGNOSIS — R0609 Other forms of dyspnea: Secondary | ICD-10-CM

## 2016-02-25 DIAGNOSIS — E119 Type 2 diabetes mellitus without complications: Secondary | ICD-10-CM | POA: Diagnosis not present

## 2016-02-25 LAB — CBC
HEMATOCRIT: 40 % (ref 36.0–46.0)
Hemoglobin: 13 g/dL (ref 12.0–15.0)
MCH: 29.7 pg (ref 26.0–34.0)
MCHC: 32.5 g/dL (ref 30.0–36.0)
MCV: 91.3 fL (ref 78.0–100.0)
PLATELETS: 276 10*3/uL (ref 150–400)
RBC: 4.38 MIL/uL (ref 3.87–5.11)
RDW: 12.9 % (ref 11.5–15.5)
WBC: 7.5 10*3/uL (ref 4.0–10.5)

## 2016-02-25 LAB — GLUCOSE, CAPILLARY
GLUCOSE-CAPILLARY: 118 mg/dL — AB (ref 65–99)
GLUCOSE-CAPILLARY: 115 mg/dL — AB (ref 65–99)
Glucose-Capillary: 141 mg/dL — ABNORMAL HIGH (ref 65–99)

## 2016-02-25 LAB — ECHOCARDIOGRAM COMPLETE
CHL CUP MV DEC (S): 306
EERAT: 7.63
EWDT: 306 ms
FS: 35 % (ref 28–44)
HEIGHTINCHES: 68 in
IV/PV OW: 0.91
LA diam index: 1.75 cm/m2
LASIZE: 33 mm
LEFT ATRIUM END SYS DIAM: 33 mm
LV E/e'average: 7.63
LVEEMED: 7.63
LVELAT: 8.59 cm/s
LVOT area: 2.54 cm2
LVOT diameter: 18 mm
Lateral S' vel: 16.5 cm/s
MVPKAVEL: 95.6 m/s
MVPKEVEL: 65.5 m/s
PW: 10.1 mm — AB (ref 0.6–1.1)
TAPSE: 22.1 mm
TDI e' lateral: 8.59
TDI e' medial: 7.72
WEIGHTICAEL: 2672 [oz_av]

## 2016-02-25 LAB — BASIC METABOLIC PANEL
Anion gap: 11 (ref 5–15)
BUN: 26 mg/dL — AB (ref 6–20)
CALCIUM: 9.8 mg/dL (ref 8.9–10.3)
CHLORIDE: 96 mmol/L — AB (ref 101–111)
CO2: 30 mmol/L (ref 22–32)
Creatinine, Ser: 0.91 mg/dL (ref 0.44–1.00)
GFR calc Af Amer: >60 mL/min (ref >60)
GFR, EST NON AFRICAN AMERICAN: 57 mL/min — AB (ref >60)
Glucose, Bld: 137 mg/dL — ABNORMAL HIGH (ref 65–99)
POTASSIUM: 3.4 mmol/L — AB (ref 3.5–5.1)
SODIUM: 137 mmol/L (ref 135–145)

## 2016-02-25 LAB — MRSA PCR SCREENING: MRSA by PCR: NEGATIVE

## 2016-02-25 LAB — TROPONIN I

## 2016-02-25 MED ORDER — INSULIN ASPART 100 UNIT/ML ~~LOC~~ SOLN: 0.0000 [IU] | Freq: Three times a day (TID) | SUBCUTANEOUS | Status: DC

## 2016-02-25 NOTE — Progress Notes (Signed)
Patient alert and oriented, denies pain. Iv and tele d/c. D/c instruction explain and given to the patient, all questions answered. Pt. Verbalized understanding. D/c pt. Home per order

## 2016-02-25 NOTE — Progress Notes (Signed)
PROGRESS NOTE  [name] [id number] DOB: [birth year] DOA: 02/24/2016 PCP: Sallee Lange, MD  HPI/Recap of past 24 hours: [name] is a 82 y.o. female with a medical history significant for diabetes and HTN.  Patient presents to ED with a two week history of fatigue/dyspnea on exertion.  Seen and examined this AM, was ambulating in the halls and still having slight sensation of odd feeling in her chest but otherwise no more leg swelling and no more shortness of breath.   Assessment/Plan: Active Problems:   Controlled type 2 diabetes mellitus without complication, without long-term current use of insulin (HCC)   Dyspnea on exertion - telemetry, no issues, cont supp care. No more lasix needed, cont home meds, await Echo today. If echo without need for acute intervention, home afterwards with close cards appointment (she has appt with cardiology on Thursday.  Code Status: partial   Family Communication: None   Disposition Plan: Home today or tomorrow, pending echo.    Consultants:  None   Procedures:  Echo pending   Antimicrobials:  None    Objective: Vitals:   02/24/16 2148 02/25/16 0235 02/25/16 0433 02/25/16 1052  BP:  (!) 118/49 (!) 125/43 (!) 128/55  Pulse:  73 66 71  Resp:  18 18 18   Temp:  98 F (36.7 C) 98.1 F (36.7 C) 98 F (36.7 C)  TempSrc:  Oral Oral Oral  SpO2:  93% 93% 96%  Weight: 75.8 kg (167 lb 1.6 oz)  75.8 kg (167 lb)   Height:        Intake/Output Summary (Last 24 hours) at 02/25/16 1417 Last data filed at 02/25/16 0433  Gross per 24 hour  Intake              120 ml  Output              575 ml  Net             -455 ml   Filed Weights   02/24/16 1057 02/24/16 2148 02/25/16 0433  Weight: 77.7 kg (171 lb 6 oz) 75.8 kg (167 lb 1.6 oz) 75.8 kg (167 lb)    Exam: General:  Alert, oriented, calm, in no acute distress Eyes: pupils round and reactive to light and accomodation, clear sclerea Neck: supple, no masses, trachea  mildline  Cardiovascular: RRR, no murmurs or rubs, no peripheral edema  Respiratory: clear to auscultation bilaterally, no wheezes, no crackles  Abdomen: soft, nontender, nondistended, normal bowel tones heard  Skin: dry, no rashes  Musculoskeletal: no joint effusions, normal range of motion  Psychiatric: appropriate affect, normal speech  Neurologic: extraocular muscles intact, clear speech, moving all extremities with intact sensorium    Data Reviewed: CBC:  Recent Labs Lab 02/24/16 1110 02/25/16 0614  WBC 10.7* 7.5  NEUTROABS 8.4*  --   HGB 14.1 13.0  HCT 42.2 40.0  MCV 89.6 91.3  PLT 302 [id number]   Basic Metabolic Panel:  Recent Labs Lab 02/24/16 1110 02/25/16 0614  NA 136 137  K 3.6 3.4*  CL 96* 96*  CO2 27 30  GLUCOSE 155* 137*  BUN 25* 26*  CREATININE 1.06* 0.91  CALCIUM 11.1* 9.8  MG 1.5*  --    GFR: Estimated Creatinine Clearance: 48.1 mL/min (by C-G formula based on SCr of 0.91 mg/dL). Liver Function Tests: No results for input(s): AST, ALT, ALKPHOS, BILITOT, PROT, ALBUMIN in the last 168 hours. No results for input(s): LIPASE, AMYLASE in the last 168  hours. No results for input(s): AMMONIA in the last 168 hours. Coagulation Profile: No results for input(s): INR, PROTIME in the last 168 hours. Cardiac Enzymes:  Recent Labs Lab 02/24/16 1955 02/25/16 0021 02/25/16 0614  TROPONINI <0.03 <0.03 <0.03   BNP (last 3 results) No results for input(s): PROBNP in the last 8760 hours. HbA1C: No results for input(s): HGBA1C in the last 72 hours. CBG:  Recent Labs Lab 02/24/16 2140 02/25/16 0608 02/25/16 1213  GLUCAP 133* 141* 118*   Lipid Profile: No results for input(s): CHOL, HDL, LDLCALC, TRIG, CHOLHDL, LDLDIRECT in the last 72 hours. Thyroid Function Tests: No results for input(s): TSH, T4TOTAL, FREET4, T3FREE, THYROIDAB in the last 72 hours. Anemia Panel: No results for input(s): VITAMINB12, FOLATE, FERRITIN, TIBC, IRON, RETICCTPCT in the last  72 hours. Urine analysis:    Component Value Date/Time   COLORURINE YELLOW 06/15/2015 0503   APPEARANCEUR CLOUDY (A) 06/15/2015 0503   LABSPEC 1.019 06/15/2015 0503   PHURINE 5.0 06/15/2015 0503   GLUCOSEU NEGATIVE 06/15/2015 0503   HGBUR NEGATIVE 06/15/2015 0503   BILIRUBINUR NEGATIVE 06/15/2015 0503   KETONESUR NEGATIVE 06/15/2015 0503   PROTEINUR NEGATIVE 06/15/2015 0503   UROBILINOGEN 0.2 12/15/2014 1956   NITRITE POSITIVE (A) 06/15/2015 0503   LEUKOCYTESUR MODERATE (A) 06/15/2015 0503   Sepsis Labs: @LABRCNTIP (procalcitonin:4,lacticidven:4)  ) Recent Results (from the past 240 hour(s))  MRSA PCR Screening     Status: None   Collection Time: 02/24/16 10:26 PM  Result Value Ref Range Status   MRSA by PCR NEGATIVE NEGATIVE Final    Comment:        The GeneXpert MRSA Assay (FDA approved for NASAL specimens only), is one component of a comprehensive MRSA colonization surveillance program. It is not intended to diagnose MRSA infection nor to guide or monitor treatment for MRSA infections.       Studies: No results found.  Scheduled Meds: . amLODipine  10 mg Oral Daily  . aspirin EC  81 mg Oral Daily  . enoxaparin (LOVENOX) injection  40 mg Subcutaneous Q24H  . hydrochlorothiazide  25 mg Oral Daily  . insulin aspart  0-15 Units Subcutaneous TID WC  . insulin aspart  0-9 Units Subcutaneous TID WC  . losartan  100 mg Oral Daily    Continuous Infusions:    LOS: 0 days   Time spent: 21 min  Mir Marry Guan, MD [facility] Pager [phone]  If 7PM-7AM, please contact night-coverage [email or web address] Password [email or web address] 02/25/2016, 2:17 PM

## 2016-02-25 NOTE — Progress Notes (Signed)
  Echocardiogram 2D Echocardiogram has been performed.  [name] 02/25/2016, 4:19 PM

## 2016-02-26 LAB — HEMOGLOBIN A1C
HEMOGLOBIN A1C: 6.4 % — AB (ref 4.8–5.6)
MEAN PLASMA GLUCOSE: 137 mg/dL

## 2016-02-29 ENCOUNTER — Ambulatory Visit (INDEPENDENT_AMBULATORY_CARE_PROVIDER_SITE_OTHER): Payer: Medicare Other | Admitting: Cardiovascular Disease

## 2016-02-29 ENCOUNTER — Encounter: Payer: Self-pay | Admitting: Cardiovascular Disease

## 2016-02-29 VITALS — BP 126/52 | HR 74 | Ht 68.0 in | Wt 172.0 lb

## 2016-02-29 DIAGNOSIS — R0600 Dyspnea, unspecified: Secondary | ICD-10-CM

## 2016-02-29 NOTE — Progress Notes (Signed)
Cardiology Office Note   Date:  02/29/2016   ID:  [name], [name] 1932-06-17, MRN [id number]  PCP:  Sallee Lange, MD  Cardiologist:   Jenkins Rouge, MD   No chief complaint on file.     History of Present Illness: [name] is a 82 y.o. female who presents for evaluation of tachycardia dyspnea and fatigue Seen in ER 02/25/16  She has history of HTN and DM  Symptoms for about 2 weeks Given lasix by primary In ER Hct ok BNP only 51 CXR with mild scarring mild hyperinflation NAD  Troponin negative   Echo 02/25/16 reviewed : EF normal 55-60%  Small pericardial effusion   She feels well now. No chest pain no pleuritic pain edema is gone BP under control   Past Medical History:  Diagnosis Date  . Diabetes mellitus without complication ([place])   . Hypertension     Past Surgical History:  Procedure Laterality Date  . ABDOMINAL HYSTERECTOMY    . APPENDECTOMY    . BREAST REDUCTION SURGERY    . CARDIAC CATHETERIZATION  06/01/2003   EF is between 60-65% -- Smooth and normal coronary arteries -- Normal left ventricular systolic function  -- We will continue with medical therapy for her hypertensio  . COLON SURGERY     left  . EYE SURGERY  03/30/2009   cataract  . KNEE SURGERY    . LAPAROSCOPIC INCISIONAL / UMBILICAL / VENTRAL HERNIA REPAIR  02/23/2007   . REFRACTIVE SURGERY       Current Outpatient Prescriptions  Medication Sig Dispense Refill  . amLODipine (NORVASC) 10 MG tablet Take 1 tablet (10 mg total) by mouth daily. 90 tablet 1  . aspirin 81 MG tablet Take 81 mg by mouth daily.      . beta carotene w/minerals (OCUVITE) tablet Take 1 tablet by mouth daily.    . cyanocobalamin 2000 MCG tablet Take 2,000 mcg by mouth daily.    Marland Kitchen losartan-hydrochlorothiazide (HYZAAR) 100-25 MG tablet Take 1 tablet by mouth daily. 90 tablet 1  . metFORMIN (GLUCOPHAGE) 1000 MG tablet TAKE ONE TABLET BY MOUTH TWICE DAILY **INCREASED  DOSE** 180 tablet 1  . multivitamin (THERAGRAN)  per tablet Take 1 tablet by mouth daily.      . polyvinyl alcohol (LIQUIFILM TEARS) 1.4 % ophthalmic solution Place 1 drop into both eyes 2 (two) times daily.     No current facility-administered medications for this visit.     Allergies:   Phenergan [promethazine hcl]; Prednisone; and Statins    Social History:  The patient  reports that she has never smoked. She has never used smokeless tobacco. She reports that she does not drink alcohol or use drugs.   Family History:  The patient's family history includes Cancer in her father and mother; Diabetes in her brother; Hypertension in her father and sister; Stroke in her father.    ROS:  Please see the history of present illness.   Otherwise, review of systems are positive for none.   All other systems are reviewed and negative.    PHYSICAL EXAM: VS:  BP (!) 126/52 (BP Location: Right Arm)   Pulse 74   Ht 5\' 8"  (1.727 m)   Wt 172 lb (78 kg)   SpO2 93%   BMI 26.15 kg/m  , BMI Body mass index is 26.15 kg/m. Affect appropriate Healthy:  appears stated age 47: normal Neck supple with no adenopathy JVP normal no bruits no thyromegaly Lungs clear  with no wheezing and good diaphragmatic motion Heart:  S1/S2 no murmur, no rub, gallop or click PMI normal Abdomen: benighn, BS positve, no tenderness, no AAA no bruit.  No HSM or HJR Distal pulses intact with no bruits No edema Neuro non-focal Skin warm and dry No muscular weakness    EKG:   02/27/16  SR lack of R waves precordium ? Old ASMI   Recent Labs: 09/08/2015: ALT 13 02/24/2016: B Natriuretic Peptide 24.9; Magnesium 1.5 02/25/2016: BUN 26; Creatinine, Ser 0.91; Hemoglobin 13.0; Platelets 276; Potassium 3.4; Sodium 137    Lipid Panel    Component Value Date/Time   CHOL 171 09/08/2015 0939   TRIG 163 (H) 09/08/2015 0939   HDL 51 09/08/2015 0939   CHOLHDL 3.4 09/08/2015 0939   CHOLHDL 3.3 07/05/2014 0907   VLDL 22 07/05/2014 0907   LDLCALC 87 09/08/2015 0939       Wt Readings from Last 3 Encounters:  02/29/16 172 lb (78 kg)  02/25/16 167 lb (75.8 kg)  02/22/16 171 lb 6 oz (77.7 kg)      Other studies Reviewed: Additional studies/ records that were reviewed today include: ER notes, ECG, CXR Notes Dr Wolfgang Phoenix .    ASSESSMENT AND PLAN:  1. Tachycardia:  Resolved no rhythm abnormality in ER or office  2. Dyspnea:  Resolved BNP normal CXR ok doubt PE ? Viral syndrome with pleuropericarditis Echo with normal EF and no valve discease 3. HTN;  Well controlled.  Continue current medications and low sodium Dash type diet.   4. Pericardial effusion : small exam with no pulsus/ or JVP elevation can consider f/u  If symptoms return 5. DM:  Discussed low carb diet.  Target hemoglobin A1c is 6.5 or less.  Continue current medications.    Current medicines are reviewed at length with the patient today.  The patient does not have concerns regarding medicines.  The following changes have been made:  no change  Labs/ tests ordered today include: None  No orders of the defined types were placed in this encounter.    Disposition:   FU with me in 3 months      Signed, Jenkins Rouge, MD  02/29/2016 3:11 PM    [facility] [address], [place], [place]  [address] Phone: [phone]; Fax: [phone]

## 2016-02-29 NOTE — Patient Instructions (Signed)
Your physician recommends that you schedule a follow-up appointment in: 3 months Dr Johnsie Cancel     Your physician recommends that you continue on your current medications as directed. Please refer to the Current Medication list given to you today.     Thank you for choosing [facility] !

## 2016-03-01 ENCOUNTER — Encounter: Payer: Self-pay | Admitting: Family Medicine

## 2016-03-09 NOTE — Discharge Summary (Signed)
Discharge Summary  [name] [id number] DOB: 1931-12-03  PCP: Sallee Lange, MD  Admit date: 02/24/2016 Discharge date: 03/09/2016   Recommendations for Outpatient Follow-up:  1. PCP 1-2 weeks 2. Dr. Johnsie Cancel within one week.   Discharge Diagnoses:  Active Hospital Problems   Diagnosis Date Noted  . Dyspnea on exertion 02/24/2016  . Controlled type 2 diabetes mellitus without complication, without long-term current use of insulin ([place]) 02/15/2011    Resolved Hospital Problems   Diagnosis Date Noted Date Resolved  No resolved problems to display.   Discharge Condition: Stable   Diet recommendation: Cardiac Diabetic  Vitals:   02/25/16 1052 02/25/16 1230  BP: (!) 128/55 (!) 124/53  Pulse: 71 74  Resp: 18 18  Temp: 98 F (36.7 C) 98 F (36.7 C)    History of present illness:  [name] a 82 y.o.femalewith a medical history significant for diabetes and HTN. Patient presents to ED with a two week history of fatigue/dyspnea on exertion.  Seen and examined this AM, was ambulating in the halls and still having slight sensation of odd feeling in her chest but otherwise no more leg swelling and no more shortness of breath.    Hospital Course:  Active Problems:   Controlled type 2 diabetes mellitus without complication, without long-term current use of insulin (HCC)   Dyspnea on exertion  Patient was admitted to the hospital and obtained EKG, TTE to rule out heart failure given new onset leg swelling. She has been ambulating in the halls today and feels well. She had TTE done today with normal EF and no valvular abnormalities. Ready for discharge home, has close f/u with cardiology scheduled.  Procedures:  Echo 8/27 normal EF   Consultations:  None   Discharge Exam: BP (!) 124/53 (BP Location: Left Arm)   Pulse 74   Temp 98 F (36.7 C) (Oral)   Resp 18   Ht 5\' 8"  (1.727 m)   Wt 75.8 kg (167 lb) Comment: a scale  SpO2 93%   BMI 25.39 kg/m    General:  Alert, oriented, calm, in no acute distress  Eyes: pupils round and reactive to light and accomodation, clear sclerea Neck: supple, no masses, trachea mildline  Cardiovascular: RRR, no murmurs or rubs, no peripheral edema  Respiratory: clear to auscultation bilaterally, no wheezes, no crackles  Abdomen: soft, nontender, nondistended, normal bowel tones heard  Skin: dry, no rashes  Musculoskeletal: no joint effusions, normal range of motion  Psychiatric: appropriate affect, normal speech  Neurologic: extraocular muscles intact, clear speech, moving all extremities with intact sensorium    Discharge Instructions You were cared for by a hospitalist during your hospital stay. If you have any questions about your discharge medications or the care you received while you were in the hospital after you are discharged, you can call the unit and asked to speak with the hospitalist on call if the hospitalist that took care of you is not available. Once you are discharged, your primary care physician will handle any further medical issues. Please note that NO REFILLS for any discharge medications will be authorized once you are discharged, as it is imperative that you return to your primary care physician (or establish a relationship with a primary care physician if you do not have one) for your aftercare needs so that they can reassess your need for medications and monitor your lab values.  Discharge Instructions    Diet - low sodium heart healthy    Complete  by:  As directed   Increase activity slowly    Complete by:  As directed       Medication List    STOP taking these medications   ALPRAZolam 0.25 MG tablet Commonly known as:  XANAX   furosemide 20 MG tablet Commonly known as:  LASIX   potassium chloride 10 MEQ tablet Commonly known as:  K-DUR   RED YEAST RICE PO     TAKE these medications   amLODipine 10 MG tablet Commonly known as:  NORVASC Take 1 tablet (10 mg total)  by mouth daily.   aspirin 81 MG tablet Take 81 mg by mouth daily.   beta carotene w/minerals tablet Take 1 tablet by mouth daily.   cyanocobalamin 2000 MCG tablet Take 2,000 mcg by mouth daily.   losartan-hydrochlorothiazide 100-25 MG tablet Commonly known as:  HYZAAR Take 1 tablet by mouth daily.   metFORMIN 1000 MG tablet Commonly known as:  GLUCOPHAGE TAKE ONE TABLET BY MOUTH TWICE DAILY **INCREASED  DOSE**   multivitamin per tablet Take 1 tablet by mouth daily.   polyvinyl alcohol 1.4 % ophthalmic solution Commonly known as:  LIQUIFILM TEARS Place 1 drop into both eyes 2 (two) times daily.      Allergies  Allergen Reactions  . Phenergan [Promethazine Hcl]     hallucinations  . Prednisone     rash  . Statins     Severe myalgias      The results of significant diagnostics from this hospitalization (including imaging, microbiology, ancillary and laboratory) are listed below for reference.    Significant Diagnostic Studies: Dg Chest 2 View  Result Date: 02/24/2016 CLINICAL DATA:  Shortness of breath, palpitations EXAM: CHEST  2 VIEW COMPARISON:  06/19/2014 FINDINGS: Bibasilar linear scarring in the lung bases, stable. Mild hyperinflation. Heart is upper limits normal in size. No effusions or acute bony abnormality. IMPRESSION: Bibasilar scarring, stable.  Stable mild hyperinflation. Electronically Signed   By: Rolm Baptise M.D.   On: 02/24/2016 11:51    Microbiology: No results found for this or any previous visit (from the past 240 hour(s)).   Labs: Basic Metabolic Panel: No results for input(s): NA, K, CL, CO2, GLUCOSE, BUN, CREATININE, CALCIUM, MG, PHOS in the last 168 hours. Liver Function Tests: No results for input(s): AST, ALT, ALKPHOS, BILITOT, PROT, ALBUMIN in the last 168 hours. No results for input(s): LIPASE, AMYLASE in the last 168 hours. No results for input(s): AMMONIA in the last 168 hours. CBC: No results for input(s): WBC, NEUTROABS, HGB,  HCT, MCV, PLT in the last 168 hours. Cardiac Enzymes: No results for input(s): CKTOTAL, CKMB, CKMBINDEX, TROPONINI in the last 168 hours. BNP: BNP (last 3 results)  Recent Labs  02/22/16 1212 02/24/16 1110  BNP 51.8 24.9    ProBNP (last 3 results) No results for input(s): PROBNP in the last 8760 hours.  CBG: No results for input(s): GLUCAP in the last 168 hours.  Time spent: 29 minutes were spent in preparing this discharge including medication reconciliation, counseling, and coordination of care.  Signed:  Efe Fazzino [employer]  [facility] 03/09/2016, 2:59 PM

## 2016-03-10 ENCOUNTER — Telehealth: Payer: Self-pay | Admitting: Family Medicine

## 2016-03-10 MED ORDER — LOSARTAN POTASSIUM 100 MG PO TABS
100.0000 mg | ORAL_TABLET | Freq: Every day | ORAL | 6 refills | Status: DC
Start: 2016-03-10 — End: 2016-09-18

## 2016-03-10 MED ORDER — INDAPAMIDE 2.5 MG PO TABS: 2.5000 mg | ORAL_TABLET | Freq: Every day | ORAL | 3 refills | Status: DC

## 2016-03-10 MED ORDER — HYDRALAZINE HCL 25 MG PO TABS: ORAL_TABLET | ORAL | 1 refills | Status: DC

## 2016-03-10 NOTE — Telephone Encounter (Signed)
I spoke with pt on Sat. Several BP readings 217/98. Asymptomatic. Pt told of changes in meds. And to follow up on Monday at 11am. Pt to take 1/2 amlodipine now. Stop Hyzaar. Start Losartan 100 1 qd, Lozol 2.5 1qd, continue amlodipine. Also hydralazine 25 mg one q8 hours prn systolic above [id number]

## 2016-03-11 ENCOUNTER — Encounter: Payer: Self-pay | Admitting: Family Medicine

## 2016-03-11 ENCOUNTER — Ambulatory Visit (INDEPENDENT_AMBULATORY_CARE_PROVIDER_SITE_OTHER): Payer: Medicare Other | Admitting: Family Medicine

## 2016-03-11 VITALS — BP 154/80 | Temp 98.1°F | Ht 68.0 in | Wt 168.0 lb

## 2016-03-11 DIAGNOSIS — R030 Elevated blood-pressure reading, without diagnosis of hypertension: Secondary | ICD-10-CM | POA: Diagnosis not present

## 2016-03-11 DIAGNOSIS — R5383 Other fatigue: Secondary | ICD-10-CM | POA: Diagnosis not present

## 2016-03-11 DIAGNOSIS — I10 Essential (primary) hypertension: Secondary | ICD-10-CM | POA: Diagnosis not present

## 2016-03-11 DIAGNOSIS — IMO0001 Reserved for inherently not codable concepts without codable children: Secondary | ICD-10-CM

## 2016-03-11 DIAGNOSIS — R000 Tachycardia, unspecified: Secondary | ICD-10-CM | POA: Diagnosis not present

## 2016-03-11 NOTE — Progress Notes (Signed)
   Subjective:    Patient ID: [name], female    DOB: [birth year], 82 y.o.   MRN: [id number]  HPIFollow up hospitalization.   On Saturday blood pressure and heart rate was high. Started on amlodipine 10mg  and indapamide 2.5 mg daily.  Patient is taking losartan 100 mg. No longer taking losartan HCTZ. Patient states she's had a little bit of cough over the past few days denies fever chills Echo EKG lab work and hospitalization were discussed in detail. Patient states she's been a little bit stressed. Patient worried about blood pressure being high but denies any headaches chest pressure stroke symptoms. Long discussion held with patient regarding her blood pressure and blood sugars Review of Systems Patient denies any chest tightness pressure pain shortness breath nausea vomiting diarrhea    Objective:   Physical Exam Lungs are clear no crackles heart regular abdomen is soft extremities no edema skin warm dry orthostatics taken does have some drop in blood pressure but not appreciable       Assessment & Plan:  Elevated blood pressure Multiple blood pressure readings taken with her cuff and with our cuff Her systolic readings typically are about 8 points higher than what our cuff indicates Stick with the current regimen monitor closely we will recheck her next week she will bring glucose readings and blood pressure readings She will use hydralazine only if systolic greater than [id number] We will check lab work as indicated Await the results of this. Sees cardiology in a couple months may need to move that appointment up Recent echo discussed with patient reassurance given 25 minutes was spent with the patient. Greater than half the time was spent in discussion and answering questions and counseling regarding the issues that the patient came in for today. Patient has Xanax at home 0.25 mg he was she was told that she could use this occasionally not for frequent use caution drowsiness

## 2016-03-11 NOTE — Telephone Encounter (Signed)
Appt scheduled

## 2016-03-13 ENCOUNTER — Telehealth: Payer: Self-pay | Admitting: Family Medicine

## 2016-03-13 LAB — METANEPHRINES, PLASMA
Metanephrine, Free: 11 pg/mL (ref 0–62)
Normetanephrine, Free: 236 pg/mL — ABNORMAL HIGH (ref 0–145)

## 2016-03-13 LAB — BASIC METABOLIC PANEL
BUN / CREAT RATIO: 14 (ref 12–28)
BUN: 13 mg/dL (ref 8–27)
CALCIUM: 10.7 mg/dL — AB (ref 8.7–10.3)
CHLORIDE: 97 mmol/L (ref 96–106)
CO2: 25 mmol/L (ref 18–29)
Creatinine, Ser: 0.9 mg/dL (ref 0.57–1.00)
GFR calc non Af Amer: 60 mL/min/{1.73_m2} (ref >59)
GFR, EST AFRICAN AMERICAN: 69 mL/min/{1.73_m2} (ref >59)
GLUCOSE: 107 mg/dL — AB (ref 65–99)
POTASSIUM: 4.4 mmol/L (ref 3.5–5.2)
Sodium: 140 mmol/L (ref 134–144)

## 2016-03-13 LAB — MAGNESIUM: MAGNESIUM: 1.8 mg/dL (ref 1.6–2.3)

## 2016-03-13 LAB — TSH: TSH: 1.65 u[IU]/mL (ref 0.450–4.500)

## 2016-03-13 LAB — T4, FREE: FREE T4: 1.77 ng/dL (ref 0.82–1.77)

## 2016-03-13 MED ORDER — AMOXICILLIN 500 MG PO CAPS: 500.0000 mg | ORAL_CAPSULE | Freq: Three times a day (TID) | ORAL | 0 refills | Status: DC

## 2016-03-13 NOTE — Telephone Encounter (Signed)
Notified patient amoxicillin 500 mg tid ten days plus otc robitussin dm patient needs to stay away from rx strength cough meds, if persist or worsens either follow up here or er. Patient verbalized understanding. Med sent in.

## 2016-03-13 NOTE — Telephone Encounter (Signed)
Patient states that she has a fever (100), cough, scratchy throat and slight wheezing. No sob or congestion.

## 2016-03-13 NOTE — Telephone Encounter (Signed)
Patient was seen 9/11 but now has a fever 100 and coughing a lot cant stop. She wants something called into [facility] .

## 2016-03-13 NOTE — Telephone Encounter (Signed)
amox 500 tid ten d plus otc rob dm pt needs to stay away from rx strength cough meds, if persist or worsens either f u here or er

## 2016-03-14 ENCOUNTER — Other Ambulatory Visit: Payer: Self-pay

## 2016-03-14 DIAGNOSIS — I10 Essential (primary) hypertension: Secondary | ICD-10-CM

## 2016-03-20 ENCOUNTER — Ambulatory Visit (INDEPENDENT_AMBULATORY_CARE_PROVIDER_SITE_OTHER): Payer: Medicare Other | Admitting: Family Medicine

## 2016-03-20 ENCOUNTER — Encounter: Payer: Self-pay | Admitting: Family Medicine

## 2016-03-20 VITALS — BP 140/66 | Ht 68.0 in | Wt 172.1 lb

## 2016-03-20 DIAGNOSIS — I10 Essential (primary) hypertension: Secondary | ICD-10-CM

## 2016-03-20 DIAGNOSIS — J189 Pneumonia, unspecified organism: Secondary | ICD-10-CM

## 2016-03-20 MED ORDER — AZITHROMYCIN 250 MG PO TABS: ORAL_TABLET | ORAL | 0 refills | Status: DC

## 2016-03-20 NOTE — Progress Notes (Signed)
   Subjective:    Patient ID: [name], female    DOB: [birth year], 82 y.o.   MRN: [id number]  Hypertension  This is a chronic problem. The problem has been gradually improving since onset. There are no associated agents to hypertension. There are no known risk factors for coronary artery disease. Treatments tried: amlodipine, losartan. The current treatment provides moderate improvement. There are no compliance problems.    Patient has a cough that has been present for several days now. Patient did have fever was started on amoxicillin still has cough congestion bringing up phlegm although not discolored denies any ongoing fever chills or sweats. Denies vomiting diarrhea. Please see above for review of systems   Review of Systems   Objective:   Physical Exam Lungs congested crackles in the left base respiratory rate normal heart regular HEENT benign extremities no edema       Assessment & Plan:   mild pneumonia should get better with antibiotics recheck in 4 weeks HTN good control currently Metanephrines slightly elevated patient will do 24-hour urine for this she will follow-up sooner if any problems

## 2016-03-22 DIAGNOSIS — I10 Essential (primary) hypertension: Secondary | ICD-10-CM | POA: Diagnosis not present

## 2016-03-26 LAB — METANEPHRINES, URINE, 24 HOUR
METANEPH TOTAL UR: 22 ug/L
METANEPHRINES 24H UR: 55 ug/(24.h) (ref 45–290)
Normetanephrine, 24H Ur: 235 ug/24 hr (ref 82–500)
Normetanephrine, Ur: 94 ug/L

## 2016-04-02 ENCOUNTER — Other Ambulatory Visit: Payer: Self-pay | Admitting: *Deleted

## 2016-04-02 MED ORDER — INDAPAMIDE 2.5 MG PO TABS: 2.5000 mg | ORAL_TABLET | Freq: Every day | ORAL | 0 refills | Status: DC

## 2016-04-03 DIAGNOSIS — L570 Actinic keratosis: Secondary | ICD-10-CM | POA: Diagnosis not present

## 2016-04-03 DIAGNOSIS — X32XXXD Exposure to sunlight, subsequent encounter: Secondary | ICD-10-CM | POA: Diagnosis not present

## 2016-04-07 ENCOUNTER — Other Ambulatory Visit: Payer: Self-pay | Admitting: Family Medicine

## 2016-04-09 DIAGNOSIS — B351 Tinea unguium: Secondary | ICD-10-CM | POA: Diagnosis not present

## 2016-04-09 DIAGNOSIS — R609 Edema, unspecified: Secondary | ICD-10-CM | POA: Diagnosis not present

## 2016-04-16 ENCOUNTER — Ambulatory Visit (INDEPENDENT_AMBULATORY_CARE_PROVIDER_SITE_OTHER): Payer: Medicare Other | Admitting: Family Medicine

## 2016-04-16 ENCOUNTER — Encounter: Payer: Self-pay | Admitting: Family Medicine

## 2016-04-16 VITALS — BP 130/80 | Ht 68.0 in | Wt 174.0 lb

## 2016-04-16 DIAGNOSIS — Z23 Encounter for immunization: Secondary | ICD-10-CM | POA: Diagnosis not present

## 2016-04-16 DIAGNOSIS — I10 Essential (primary) hypertension: Secondary | ICD-10-CM | POA: Diagnosis not present

## 2016-04-16 NOTE — Progress Notes (Signed)
   Subjective:    Patient ID: [name], female    DOB: [birth year], 82 y.o.   MRN: [id number]  HPI Patient is here today for a follow up visit after being diagnosed with pneumonia. Patient states that she is doing a lot better. Patient is still having bilateral ankle swelling.  Patient has questions about the pneumonia and blood pressure issues.   Review of Systems  Constitutional: Negative for activity change, fatigue and fever.  Respiratory: Negative for cough and shortness of breath.   Cardiovascular: Negative for chest pain and leg swelling.  Neurological: Negative for headaches.       Objective:   Physical Exam  Constitutional: She appears well-nourished. No distress.  Cardiovascular: Normal rate, regular rhythm and normal heart sounds.   No murmur heard. Pulmonary/Chest: Effort normal and breath sounds normal. No respiratory distress.  Musculoskeletal: She exhibits no edema.  Lymphadenopathy:    She has no cervical adenopathy.  Neurological: She is alert. She exhibits normal muscle tone.  Psychiatric: Her behavior is normal.  Vitals reviewed.         Assessment & Plan:  Blood pressure overall very good continue current measures  Pedal edema associated with amlodipine support hose recommended  Diabetes checkup later this fall  Lung recheck very good pneumonia resolved

## 2016-04-29 ENCOUNTER — Other Ambulatory Visit: Payer: Self-pay | Admitting: Family Medicine

## 2016-04-29 NOTE — Telephone Encounter (Signed)
May refill +5 additional refills

## 2016-05-14 NOTE — Progress Notes (Signed)
Cardiology Office Note   Date:  05/15/2016   ID:  [name], DOB [birth year], MRN [id number]  PCP:  Sallee Lange, MD  Cardiologist:   Jenkins Rouge, MD   No chief complaint on file.     History of Present Illness: [name] is a 82 y.o. female who presents for evaluation of tachycardia dyspnea and fatigue First seen by me August 2017 Seen in ER  02/25/16  She has history of HTN and DM  Symptoms for about 2 weeks Given lasix by primary In ER Hct ok  BNP only 51 CXR with mild scarring mild hyperinflation NAD  Troponin negative   Echo 02/25/16 reviewed : EF normal 55-60%  Small pericardial effusion    Had headache this am with elevated BP and palpitations. Episode lasted about 30 minutes better now but "strength sapped out of her" Dr Wolfgang Phoenix gave her hydralazine to take if she feels this way and BP up She has not used yet    Past Medical History:  Diagnosis Date  . Diabetes mellitus without complication ([place])   . Hypertension     Past Surgical History:  Procedure Laterality Date  . ABDOMINAL HYSTERECTOMY    . APPENDECTOMY    . BREAST REDUCTION SURGERY    . CARDIAC CATHETERIZATION  06/01/2003   EF is between 60-65% -- Smooth and normal coronary arteries -- Normal left ventricular systolic function  -- We will continue with medical therapy for her hypertensio  . COLON SURGERY     left  . EYE SURGERY  03/30/2009   cataract  . KNEE SURGERY    . LAPAROSCOPIC INCISIONAL / UMBILICAL / VENTRAL HERNIA REPAIR  02/23/2007   . REFRACTIVE SURGERY       Current Outpatient Prescriptions  Medication Sig Dispense Refill  . ALPRAZolam (XANAX) 0.25 MG tablet TAKE ONE TABLET BY MOUTH ONCE DAILY AT BEDTIME 30 tablet 5  . amLODipine (NORVASC) 10 MG tablet Take 1 tablet (10 mg total) by mouth daily. 90 tablet 1  . aspirin 81 MG tablet Take 81 mg by mouth daily.      . beta carotene w/minerals (OCUVITE) tablet Take 1 tablet by mouth daily.    . Cholecalciferol (VITAMIN D PO)  Take by mouth. Vit d3 1,00 iu daily    . cyanocobalamin 2000 MCG tablet Take 2,000 mcg by mouth daily.    . hydrALAZINE (APRESOLINE) 25 MG tablet 1 q8 hours if systolic greater than [id number] 30 tablet 1  . indapamide (LOZOL) 2.5 MG tablet Take 1 tablet (2.5 mg total) by mouth daily. 90 tablet 0  . losartan (COZAAR) 100 MG tablet Take 1 tablet (100 mg total) by mouth daily. 30 tablet 6  . metFORMIN (GLUCOPHAGE) 1000 MG tablet TAKE ONE TABLET BY MOUTH TWICE DAILY **INCREASED  DOSE** 180 tablet 1  . polyvinyl alcohol (LIQUIFILM TEARS) 1.4 % ophthalmic solution Place 1 drop into both eyes 2 (two) times daily.     No current facility-administered medications for this visit.     Allergies:   Phenergan [promethazine hcl]; Prednisone; and Statins    Social History:  The patient  reports that she has never smoked. She has never used smokeless tobacco. She reports that she does not drink alcohol or use drugs.   Family History:  The patient's family history includes Cancer in her father and mother; Diabetes in her brother; Hypertension in her father and sister; Stroke in her father.    ROS:  Please see the  history of present illness.   Otherwise, review of systems are positive for none.   All other systems are reviewed and negative.    PHYSICAL EXAM: VS:  BP (!) 154/60   Pulse 88   Ht 5\' 8"  (1.727 m)   Wt 77.1 kg (170 lb)   SpO2 99%   BMI 25.85 kg/m  , BMI Body mass index is 25.85 kg/m. Affect appropriate Healthy:  appears stated age 20: normal Neck supple with no adenopathy JVP normal no bruits no thyromegaly Lungs clear with no wheezing and good diaphragmatic motion Heart:  S1/S2 no murmur, no rub, gallop or click PMI normal Abdomen: benighn, BS positve, no tenderness, no AAA no bruit.  No HSM or HJR Distal pulses intact with no bruits No edema Neuro non-focal Skin warm and dry No muscular weakness    EKG:   02/27/16  SR lack of R waves precordium ? Old ASMI  05/15/16      Recent Labs: 09/08/2015: ALT 13 02/24/2016: B Natriuretic Peptide 24.9 02/25/2016: Hemoglobin 13.0; Platelets 276 03/11/2016: BUN 13; Creatinine, Ser 0.90; Magnesium 1.8; Potassium 4.4; Sodium 140; TSH 1.650    Lipid Panel    Component Value Date/Time   CHOL 171 09/08/2015 0939   TRIG 163 (H) 09/08/2015 0939   HDL 51 09/08/2015 0939   CHOLHDL 3.4 09/08/2015 0939   CHOLHDL 3.3 07/05/2014 0907   VLDL 22 07/05/2014 0907   LDLCALC 87 09/08/2015 0939      Wt Readings from Last 3 Encounters:  05/15/16 77.1 kg (170 lb)  04/16/16 78.9 kg (174 lb)  03/20/16 78.1 kg (172 lb 2 oz)      Other studies Reviewed: Additional studies/ records that were reviewed today include: ER notes, ECG, CXR Notes Dr Wolfgang Phoenix .    ASSESSMENT AND PLAN:  1. Tachycardia:  Recurrent episode one since August not frequent enough for event monitor will call If recurs  2. Dyspnea:  Resolved BNP normal CXR ok doubt PE ? Viral syndrome with pleuropericarditis Echo with normal EF and no valve discease 3. HTN;  Well controlled.  Continue current medications and low sodium Dash type diet.  PRN hydralazine For spikes per Dr Wolfgang Phoenix  4. Pericardial effusion : small exam with no pulsus/ or JVP elevation can consider f/u  Echo next visit  5. DM:  Discussed low carb diet.  Target hemoglobin A1c is 6.5 or less.  Continue current medications.    Current medicines are reviewed at length with the patient today.  The patient does not have concerns regarding medicines.  The following changes have been made:  no change  Labs/ tests ordered today include: None   Orders Placed This Encounter  Procedures  . EKG 12-Lead     Disposition:   FU with me in 3 months      Signed, Jenkins Rouge, MD  05/15/2016 10:40 AM    [facility] [employer] [employer], [place], [place]  [address] Phone: [phone]; Fax: [phone]

## 2016-05-15 ENCOUNTER — Ambulatory Visit (INDEPENDENT_AMBULATORY_CARE_PROVIDER_SITE_OTHER): Payer: Medicare Other | Admitting: Cardiovascular Disease

## 2016-05-15 ENCOUNTER — Encounter: Payer: Self-pay | Admitting: Cardiovascular Disease

## 2016-05-15 VITALS — BP 154/60 | HR 88 | Ht 68.0 in | Wt 170.0 lb

## 2016-05-15 DIAGNOSIS — R002 Palpitations: Secondary | ICD-10-CM | POA: Diagnosis not present

## 2016-05-15 NOTE — Patient Instructions (Signed)
Medication Instructions:  Your physician recommends that you continue on your current medications as directed. Please refer to the Current Medication list given to you today.   Labwork: NONE  Testing/Procedures: NONE  Follow-Up: Your physician recommends that you schedule a follow-up appointment in: 3 MONTHS    Any Other Special Instructions Will Be Listed Below (If Applicable).     If you need a refill on your cardiac medications before your next appointment, please call your pharmacy.   

## 2016-05-16 ENCOUNTER — Ambulatory Visit: Payer: Medicare Other | Admitting: Family Medicine

## 2016-05-29 ENCOUNTER — Encounter: Payer: Self-pay | Admitting: Family Medicine

## 2016-05-29 ENCOUNTER — Ambulatory Visit (INDEPENDENT_AMBULATORY_CARE_PROVIDER_SITE_OTHER): Payer: Medicare Other | Admitting: Family Medicine

## 2016-05-29 VITALS — BP 126/78 | Ht 68.0 in | Wt 172.2 lb

## 2016-05-29 DIAGNOSIS — I10 Essential (primary) hypertension: Secondary | ICD-10-CM

## 2016-05-29 DIAGNOSIS — E119 Type 2 diabetes mellitus without complications: Secondary | ICD-10-CM

## 2016-05-29 LAB — POCT GLYCOSYLATED HEMOGLOBIN (HGB A1C): HEMOGLOBIN A1C: 6

## 2016-05-29 NOTE — Progress Notes (Signed)
   Subjective:    Patient ID: [name], female    DOB: [birth year], 82 y.o.   MRN: [id number]  Hypertension  This is a chronic problem. The current episode started more than 1 year ago. Pertinent negatives include no chest pain, headaches or shortness of breath. Risk factors for coronary artery disease include diabetes mellitus and post-menopausal state. Treatments tried: norvasc, losartin, lozol. There are no compliance problems.    Patient had an attack during the summer-had full cardiac work up that was normal-Patient wonders if it is panic attacks   Review of Systems  Constitutional: Negative for activity change, fatigue and fever.  Respiratory: Negative for cough and shortness of breath.   Cardiovascular: Negative for chest pain and leg swelling.  Neurological: Negative for headaches.       Objective:   Physical Exam  Constitutional: She appears well-nourished. No distress.  Cardiovascular: Normal rate, regular rhythm and normal heart sounds.   No murmur heard. Pulmonary/Chest: Effort normal and breath sounds normal. No respiratory distress.  Musculoskeletal: She exhibits no edema.  Lymphadenopathy:    She has no cervical adenopathy.  Neurological: She is alert. She exhibits normal muscle tone.  Psychiatric: Her behavior is normal.  Vitals reviewed.         Assessment & Plan:  Recent hypertension under good control currently patient will report back if any problems  Diabetes very good control continue current measures  Possible mild anxiousness at times patient using Xanax at nighttime to help her sleep she was cautioned regarding drowsiness  Recheck patient in March

## 2016-05-30 IMAGING — CT CT ENTEROGRAPHY (ABD-PELV W/ CM)
2 of 6 series · 17 of 46 positions shown, 19 images · IV contrast (OMNIPAQUE)
Comparison: 06/15/2015

CLINICAL DATA: Small obstruction, and question ileus, generalized
abdominal tenderness for 4 days with nausea and vomiting,
hypertension, diabetes mellitus, prior appendectomy, hysterectomy
and colon surgery (for diverticulitis)

EXAM:
CT ABDOMEN AND PELVIS WITH CONTRAST (ENTEROGRAPHY)
TECHNIQUE: Multidetector CT of the abdomen and pelvis during bolus
administration of intravenous contrast. Negative oral contrast
VoLumen was given. Sagittal and coronal MPR images reconstructed
from axial data set.
CONTRAST:  100mL OMNIPAQUE IOHEXOL 300 MG/ML SOLN IV. Negative oral
contrast administered.

[Series 4: enterography thins 3's · axial · 0.83mm/px · z∈[-496,-86]mm · 14 of 157 slices shown, 16 images]
[im 10/157  soft-tissue]
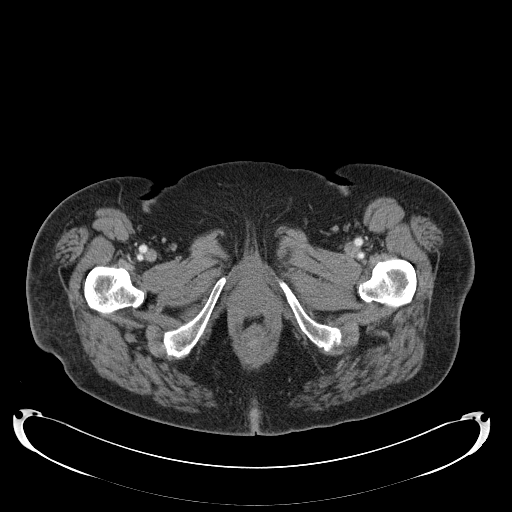
[im 10/157  bone]
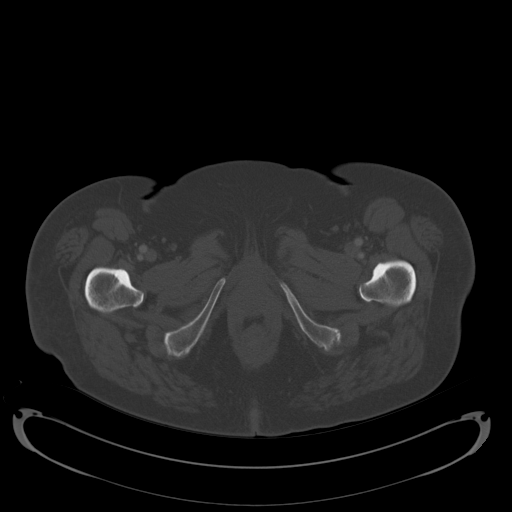
[im 20/157  soft-tissue]
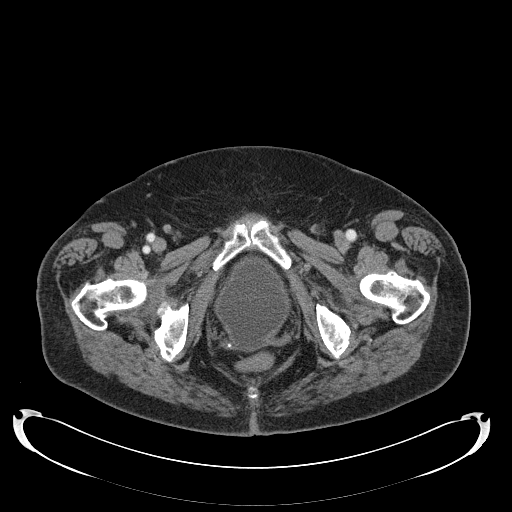
[im 30/157  soft-tissue]
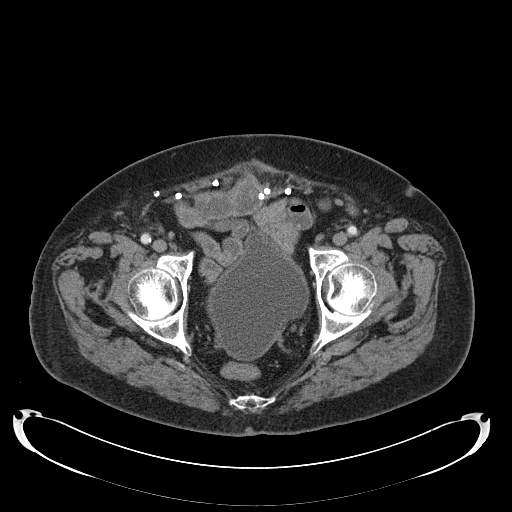
[im 40/157  soft-tissue]
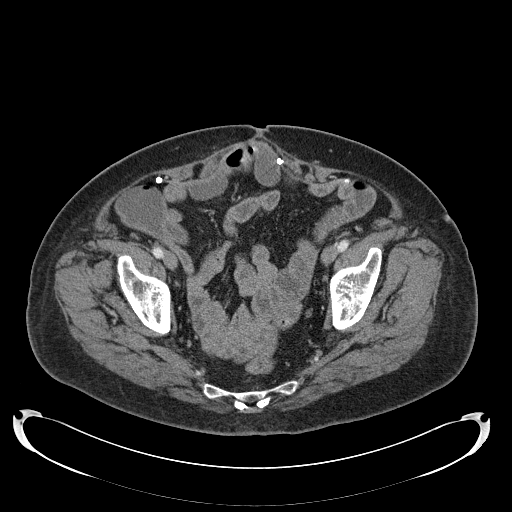
[im 49/157  soft-tissue]
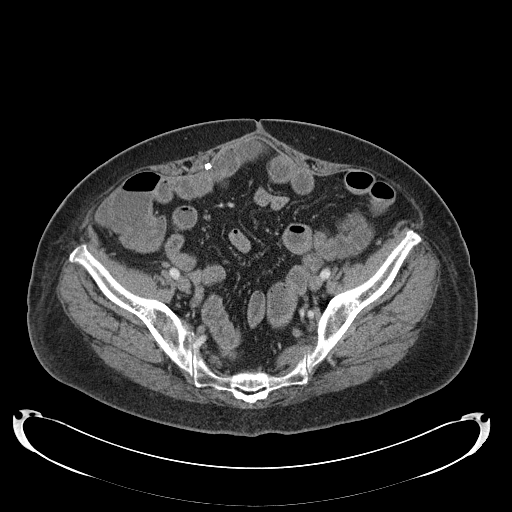
[im 59/157  soft-tissue]
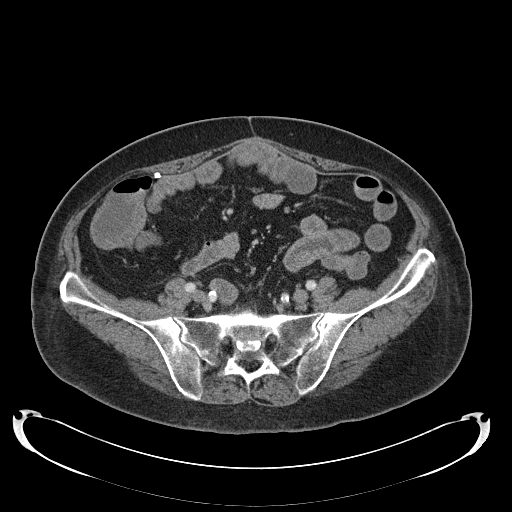
[im 69/157  soft-tissue]
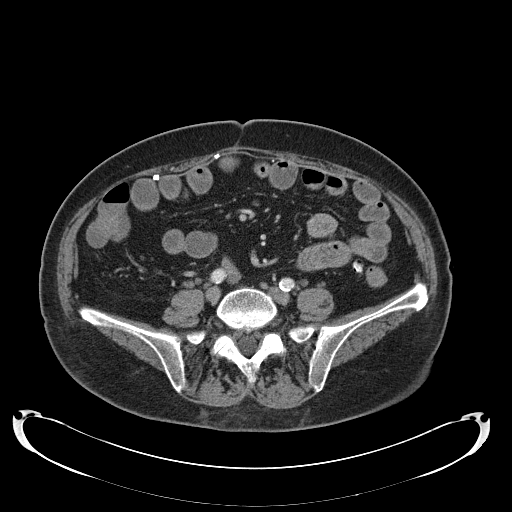
[im 88/157  soft-tissue]
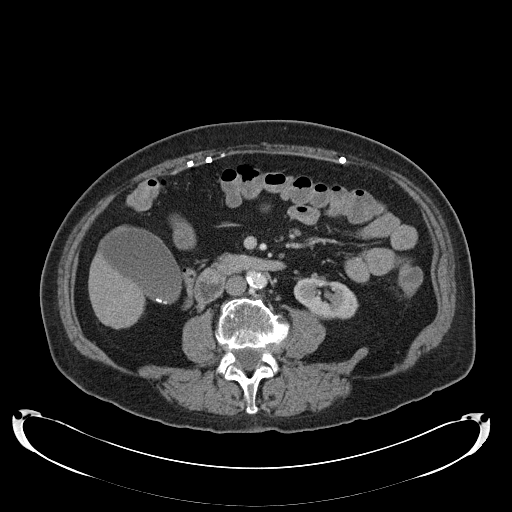
[im 98/157  soft-tissue]
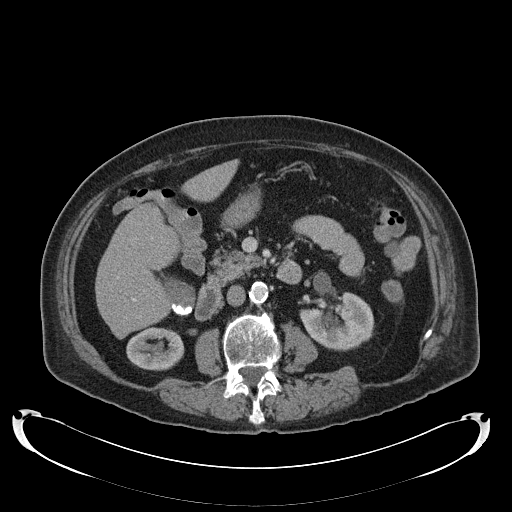
[im 98/157  bone]
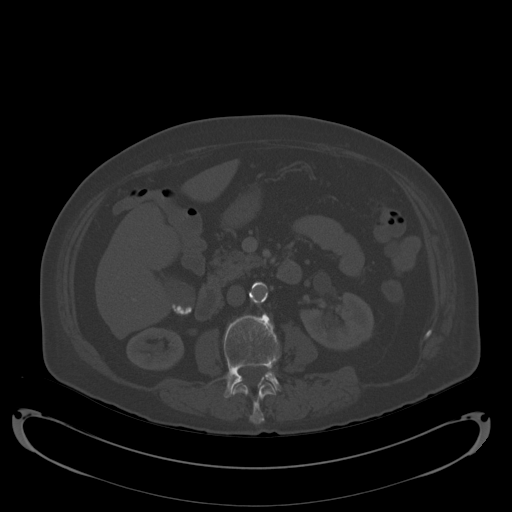
[im 108/157  soft-tissue]
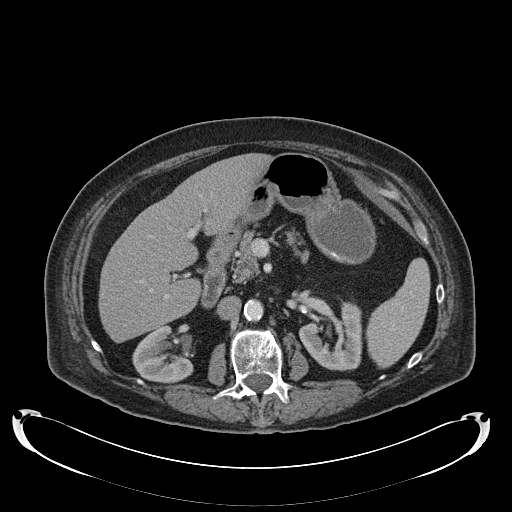
[im 118/157  soft-tissue]
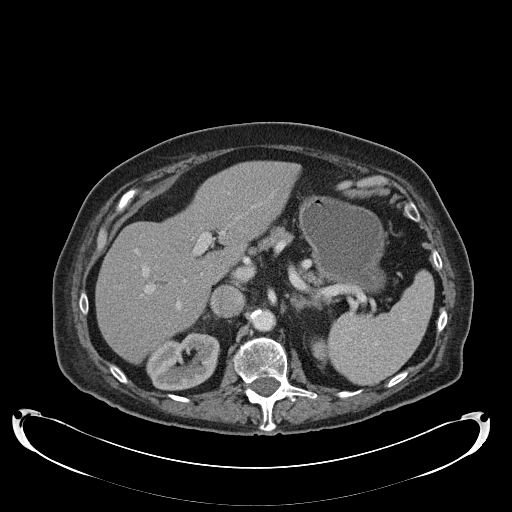
[im 127/157  soft-tissue]
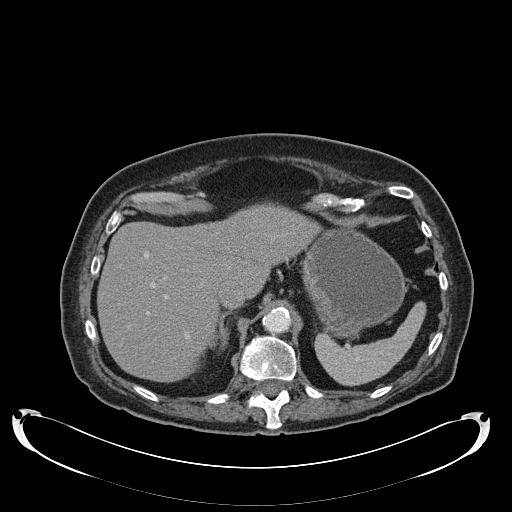
[im 137/157  soft-tissue]
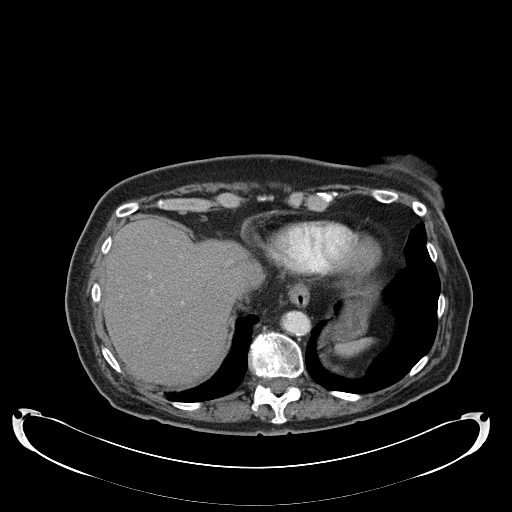
[im 147/157  soft-tissue]
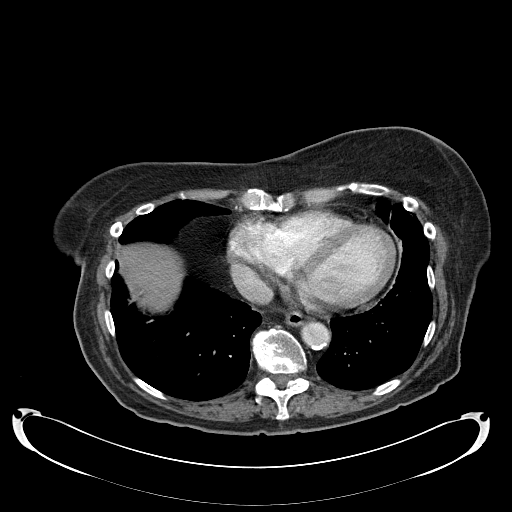

[Series 602: <mpr thick range> · coronal · 0.92mm/px · 3 of 135 slices shown]
[im 45/135  soft-tissue]
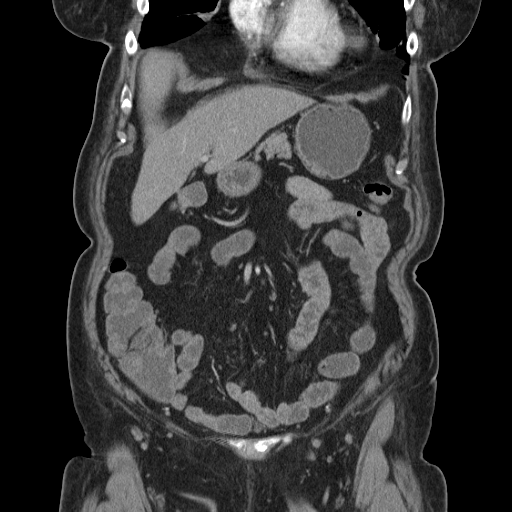
[im 60/135  soft-tissue]
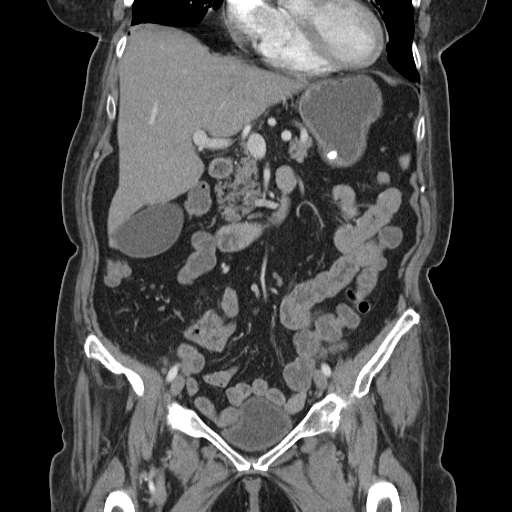
[im 75/135  soft-tissue]
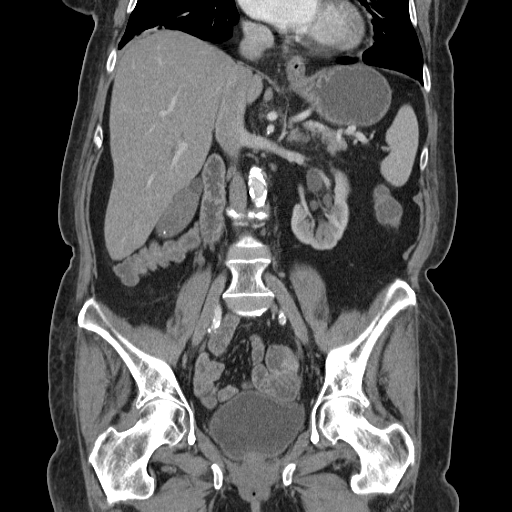

[17 of 46 positions shown; findings below may reference images not displayed]

FINDINGS: Minimal bibasilar atelectasis.

Dependent gallstones in a mildly distended gallbladder.

No CBD stones or biliary dilatation.

Slight malrotation of the LEFT kidney again noted.

Liver, spleen, pancreas, kidneys, and adrenal glands otherwise
normal.

Scattered atherosclerotic calcifications.

Cystocele, bladder otherwise unremarkable.

Stomach and bowel loops grossly normal appearance.

No focal bowel wall thickening, areas of abnormal mucosal
enhancement, mass, or dilatation.

Prior ventral herniorrhaphy.

No mass, adenopathy, free air, free fluid or inflammatory process.

Bones demineralized.
IMPRESSION: Cholelithiasis with minimally distended gallbladder ; no gallbladder
wall thickening or biliary dilatation identified.

Cystocele.

Otherwise negative exam.

## 2016-07-02 ENCOUNTER — Inpatient Hospital Stay (HOSPITAL_COMMUNITY)
Admission: EM | Admit: 2016-07-02 | Discharge: 2016-07-08 | DRG: 390 | Disposition: A | Discharge status: Home Health | Payer: Medicare Other | Attending: Family Medicine | Admitting: Family Medicine

## 2016-07-02 ENCOUNTER — Encounter (HOSPITAL_COMMUNITY): Payer: Self-pay | Admitting: Emergency Medicine

## 2016-07-02 ENCOUNTER — Emergency Department (HOSPITAL_COMMUNITY): Payer: Medicare Other

## 2016-07-02 DIAGNOSIS — Z79899 Other long term (current) drug therapy: Secondary | ICD-10-CM | POA: Diagnosis not present

## 2016-07-02 DIAGNOSIS — K56600 Partial intestinal obstruction, unspecified as to cause: Secondary | ICD-10-CM | POA: Diagnosis not present

## 2016-07-02 DIAGNOSIS — K5669 Other partial intestinal obstruction: Secondary | ICD-10-CM | POA: Diagnosis not present

## 2016-07-02 DIAGNOSIS — E119 Type 2 diabetes mellitus without complications: Secondary | ICD-10-CM | POA: Diagnosis not present

## 2016-07-02 DIAGNOSIS — Z7984 Long term (current) use of oral hypoglycemic drugs: Secondary | ICD-10-CM | POA: Diagnosis not present

## 2016-07-02 DIAGNOSIS — I10 Essential (primary) hypertension: Secondary | ICD-10-CM | POA: Diagnosis present

## 2016-07-02 DIAGNOSIS — R112 Nausea with vomiting, unspecified: Secondary | ICD-10-CM | POA: Diagnosis present

## 2016-07-02 DIAGNOSIS — Z4682 Encounter for fitting and adjustment of non-vascular catheter: Secondary | ICD-10-CM | POA: Diagnosis not present

## 2016-07-02 DIAGNOSIS — K56609 Unspecified intestinal obstruction, unspecified as to partial versus complete obstruction: Secondary | ICD-10-CM | POA: Diagnosis present

## 2016-07-02 DIAGNOSIS — R109 Unspecified abdominal pain: Secondary | ICD-10-CM | POA: Diagnosis not present

## 2016-07-02 DIAGNOSIS — Z7982 Long term (current) use of aspirin: Secondary | ICD-10-CM

## 2016-07-02 DIAGNOSIS — I700 Atherosclerosis of aorta: Secondary | ICD-10-CM

## 2016-07-02 DIAGNOSIS — K8020 Calculus of gallbladder without cholecystitis without obstruction: Secondary | ICD-10-CM | POA: Diagnosis not present

## 2016-07-02 DIAGNOSIS — Z0189 Encounter for other specified special examinations: Secondary | ICD-10-CM

## 2016-07-02 DIAGNOSIS — E1151 Type 2 diabetes mellitus with diabetic peripheral angiopathy without gangrene: Secondary | ICD-10-CM

## 2016-07-02 LAB — URINALYSIS, ROUTINE W REFLEX MICROSCOPIC
Bilirubin Urine: NEGATIVE
Glucose, UA: NEGATIVE mg/dL
Hgb urine dipstick: NEGATIVE
KETONES UR: NEGATIVE mg/dL
NITRITE: POSITIVE — AB
PROTEIN: NEGATIVE mg/dL
Specific Gravity, Urine: 1.009 (ref 1.005–1.030)
pH: 7 (ref 5.0–8.0)

## 2016-07-02 LAB — CBC
HCT: 40.4 % (ref 36.0–46.0)
Hemoglobin: 13.2 g/dL (ref 12.0–15.0)
MCH: 29.4 pg (ref 26.0–34.0)
MCHC: 32.7 g/dL (ref 30.0–36.0)
MCV: 90 fL (ref 78.0–100.0)
PLATELETS: 296 10*3/uL (ref 150–400)
RBC: 4.49 MIL/uL (ref 3.87–5.11)
RDW: 13.4 % (ref 11.5–15.5)
WBC: 14.7 10*3/uL — AB (ref 4.0–10.5)

## 2016-07-02 LAB — URINALYSIS, MICROSCOPIC (REFLEX)

## 2016-07-02 LAB — COMPREHENSIVE METABOLIC PANEL
ALT: 15 U/L (ref 14–54)
AST: 22 U/L (ref 15–41)
Albumin: 4.1 g/dL (ref 3.5–5.0)
Alkaline Phosphatase: 91 U/L (ref 38–126)
Anion gap: 14 (ref 5–15)
BILIRUBIN TOTAL: 0.8 mg/dL (ref 0.3–1.2)
BUN: 23 mg/dL — ABNORMAL HIGH (ref 6–20)
CHLORIDE: 98 mmol/L — AB (ref 101–111)
CO2: 25 mmol/L (ref 22–32)
CREATININE: 0.97 mg/dL (ref 0.44–1.00)
Calcium: 9.8 mg/dL (ref 8.9–10.3)
GFR, EST NON AFRICAN AMERICAN: 53 mL/min — AB (ref >60)
Glucose, Bld: 170 mg/dL — ABNORMAL HIGH (ref 65–99)
Potassium: 3.6 mmol/L (ref 3.5–5.1)
Sodium: 137 mmol/L (ref 135–145)
TOTAL PROTEIN: 7.2 g/dL (ref 6.5–8.1)

## 2016-07-02 LAB — LIPASE, BLOOD: LIPASE: 28 U/L (ref 11–51)

## 2016-07-02 LAB — CBG MONITORING, ED: GLUCOSE-CAPILLARY: 148 mg/dL — AB (ref 65–99)

## 2016-07-02 MED ORDER — SODIUM CHLORIDE 0.9 % IV BOLUS (SEPSIS)
500.0000 mL | Freq: Once | INTRAVENOUS | Status: AC
  Administered 2016-07-02: 500 mL via INTRAVENOUS

## 2016-07-02 MED ORDER — ONDANSETRON HCL 4 MG/2ML IJ SOLN
4.0000 mg | Freq: Once | INTRAMUSCULAR | Status: AC
  Administered 2016-07-02: 4 mg via INTRAVENOUS
  Filled 2016-07-02: qty 2

## 2016-07-02 MED ORDER — IOPAMIDOL (ISOVUE-300) INJECTION 61%
100.0000 mL | Freq: Once | INTRAVENOUS | Status: AC | PRN
  Administered 2016-07-02: 100 mL via INTRAVENOUS

## 2016-07-02 MED ORDER — MORPHINE SULFATE (PF) 4 MG/ML IV SOLN
4.0000 mg | Freq: Once | INTRAVENOUS | Status: AC
  Administered 2016-07-02: 4 mg via INTRAVENOUS
  Filled 2016-07-02: qty 1

## 2016-07-02 NOTE — ED Triage Notes (Signed)
Pt c/o abd pain with n/v since dinner.

## 2016-07-02 NOTE — ED Provider Notes (Signed)
[facility] DEPT Provider Note   CSN: [id number] Arrival date & time: 07/02/16  2046   By signing my name below, I, [name], attest that this documentation has been prepared under the direction and in the presence of Linda Rice, MD. Electronically Signed: Hilbert [name], Scribe. 07/02/16. 9:23 PM. History   Chief Complaint Chief Complaint  Patient presents with  . Abdominal Pain    The history is provided by the patient. No language interpreter was used.   HPI Comments: [name] is a 83 y.o. female who presents to the Emergency Department complaining of non-radiating, Waxing and waning abdominal pain that began around 6pm tonight. She states that she just eaten dinner tonight when the pain began. She reports 2 episodes of associated vomiting. No blood in vomit. She strained to have a small bowel movement otherwise denies diarrhea or passage of gas. Denies difficulty breathing or chest pain.   Past Medical History:  Diagnosis Date  . Diabetes mellitus without complication ([place])   . Hypertension     Patient Active Problem List   Diagnosis Date Noted  . SBO (small bowel obstruction) 07/02/2016  . Dyspnea on exertion 02/24/2016  . Other fatigue   . Hyperlipidemia 09/14/2015  . E-coli UTI 06/18/2015  . Partial small bowel obstruction 06/15/2015  . Ileitis, regional, with intestinal obstruction 06/15/2015  . Leukocytosis 12/16/2014  . Nausea and vomiting 12/15/2014  . HLD (hyperlipidemia) 12/15/2014  . HTN (hypertension) 12/15/2014  . Controlled type 2 diabetes mellitus without complication, without long-term current use of insulin ([place]) 02/15/2011    Past Surgical History:  Procedure Laterality Date  . ABDOMINAL HYSTERECTOMY    . APPENDECTOMY    . BREAST REDUCTION SURGERY    . CARDIAC CATHETERIZATION  06/01/2003   EF is between 60-65% -- Smooth and normal coronary arteries -- Normal left ventricular systolic function  -- We will continue with medical  therapy for her hypertensio  . COLON SURGERY     left  . EYE SURGERY  03/30/2009   cataract  . KNEE SURGERY    . LAPAROSCOPIC INCISIONAL / UMBILICAL / VENTRAL HERNIA REPAIR  02/23/2007   . REFRACTIVE SURGERY      OB History    No data available       Home Medications    Prior to Admission medications   Medication Sig Start Date End Date Taking? Authorizing Provider  ALPRAZolam Duanne Moron) 0.25 MG tablet TAKE ONE TABLET BY MOUTH ONCE DAILY AT BEDTIME 04/29/16  Yes Kathyrn Drown, MD  amLODipine (NORVASC) 10 MG tablet Take 1 tablet (10 mg total) by mouth daily. 01/15/16  Yes Kathyrn Drown, MD  aspirin 81 MG tablet Take 81 mg by mouth daily.     Yes Historical Provider, MD  beta carotene w/minerals (OCUVITE) tablet Take 1 tablet by mouth daily.   Yes Historical Provider, MD  Cholecalciferol (VITAMIN D PO) Take by mouth. Vit d3 1,00 iu daily   Yes Historical Provider, MD  cyanocobalamin 2000 MCG tablet Take 2,000 mcg by mouth daily.   Yes Historical Provider, MD  hydrALAZINE (APRESOLINE) 25 MG tablet 1 q8 hours if systolic greater than [id number] Patient taking differently: Take 25 mg by mouth every 8 (eight) hours as needed. 1 q8 hours if systolic greater than [id number] 03/10/16  Yes Scott A Luking, MD  indapamide (LOZOL) 2.5 MG tablet Take 1 tablet (2.5 mg total) by mouth daily. 04/02/16  Yes Kathyrn Drown, MD  losartan (COZAAR) 100 MG tablet Take  1 tablet (100 mg total) by mouth daily. 03/10/16  Yes Kathyrn Drown, MD  metFORMIN (GLUCOPHAGE) 1000 MG tablet TAKE ONE TABLET BY MOUTH TWICE DAILY **INCREASED  DOSE** Patient taking differently: TAKE ONE TABLET BY MOUTH TWICE DAILY 04/08/16  Yes Kathyrn Drown, MD  polyvinyl alcohol (LIQUIFILM TEARS) 1.4 % ophthalmic solution Place 1 drop into both eyes 2 (two) times daily.   Yes Historical Provider, MD    Family History Family History  Problem Relation Age of Onset  . Hypertension Father     kidney  . Stroke Father   . Cancer Father   . Cancer Mother      ovaian  . Diabetes Brother   . Hypertension Sister     Social History Social History  Substance Use Topics  . Smoking status: Never Smoker  . Smokeless tobacco: Never Used  . Alcohol use No     Allergies   Phenergan [promethazine hcl]; Prednisone; and Statins   Review of Systems Review of Systems  Constitutional: Negative for chills and fever.  Respiratory: Negative for cough and shortness of breath.   Cardiovascular: Negative for chest pain and leg swelling.  Gastrointestinal: Positive for abdominal distention, abdominal pain, nausea and vomiting. Negative for blood in stool, constipation and diarrhea.  Genitourinary: Negative for dysuria, flank pain and frequency.  Musculoskeletal: Negative for back pain, myalgias, neck pain and neck stiffness.  Skin: Negative for rash and wound.  Neurological: Negative for dizziness, weakness, light-headedness, numbness and headaches.  All other systems reviewed and are negative.    Physical Exam Updated Vital Signs BP 143/57   Pulse 85   Temp 98.2 F (36.8 C) (Temporal)   Resp 19   Ht 5\' 8"  (1.727 m)   Wt 173 lb (78.5 kg)   SpO2 98%   BMI 26.30 kg/m   Physical Exam  Constitutional: She is oriented to person, place, and time. She appears well-developed and well-nourished. No distress.  HENT:  Head: Normocephalic and atraumatic.  Mouth/Throat: Oropharynx is clear and moist. No oropharyngeal exudate.  Eyes: EOM are normal. Pupils are equal, round, and reactive to light.  Neck: Normal range of motion. Neck supple.  Cardiovascular: Normal rate and regular rhythm.   Pulmonary/Chest: Effort normal and breath sounds normal.  Abdominal: Soft. She exhibits distension. There is no tenderness. There is no rebound and no guarding.  High-pitched bowel sounds. Diffusely tender to palpation. No definite rebound or guarding.  Musculoskeletal: Normal range of motion. She exhibits no edema or tenderness.  No CVA tenderness. No lower  extremity swelling, asymmetry or tenderness. Distal pulses intact  Neurological: She is alert and oriented to person, place, and time.  Moving all extremities without deficit. Sensation intact.  Skin: Skin is warm and dry. Capillary refill takes less than 2 seconds. No rash noted. No erythema.  Psychiatric: She has a normal mood and affect. Her behavior is normal.  Nursing note and vitals reviewed.    ED Treatments / Results  DIAGNOSTIC STUDIES: Oxygen Saturation is 100% on RA, normal by my interpretation.    COORDINATION OF CARE: 9:23 PM Discussed treatment plan with pt at bedside and pt agreed to plan.  Labs (all labs ordered are listed, but only abnormal results are displayed) Labs Reviewed  COMPREHENSIVE METABOLIC PANEL - Abnormal; Notable for the following:       Result Value   Chloride 98 (*)    Glucose, Bld 170 (*)    BUN 23 (*)    GFR calc  non Af Amer 53 (*)    All other components within normal limits  CBC - Abnormal; Notable for the following:    WBC 14.7 (*)    All other components within normal limits  URINALYSIS, ROUTINE W REFLEX MICROSCOPIC - Abnormal; Notable for the following:    APPearance HAZY (*)    Nitrite POSITIVE (*)    Leukocytes, UA LARGE (*)    All other components within normal limits  URINALYSIS, MICROSCOPIC (REFLEX) - Abnormal; Notable for the following:    Bacteria, UA RARE (*)    Squamous Epithelial / LPF 0-5 (*)    All other components within normal limits  CBG MONITORING, ED - Abnormal; Notable for the following:    Glucose-Capillary 148 (*)    All other components within normal limits  LIPASE, BLOOD    EKG  EKG Interpretation None       Radiology Ct Abdomen Pelvis W Contrast  Result Date: 07/02/2016 CLINICAL DATA:  Non radiating waxing and waning abdominal pain EXAM: CT ABDOMEN AND PELVIS WITH CONTRAST TECHNIQUE: Multidetector CT imaging of the abdomen and pelvis was performed using the standard protocol following bolus  administration of intravenous contrast. CONTRAST:  170mL ISOVUE-300 IOPAMIDOL (ISOVUE-300) INJECTION 61% COMPARISON:  06/18/2015 FINDINGS: Lower chest: Mild atelectasis in the right middle lobe. No acute consolidation or pleural effusion. Heart size upper normal. Trace pericardial effusion. Hepatobiliary: Calcified liver granuloma. No intra hepatic biliary dilatation. Multiple calcified stones in the gallbladder, no wall thickening. Small calcified stone in the cystic duct. Pancreas: Unremarkable. No pancreatic ductal dilatation or surrounding inflammatory changes. Spleen: Normal in size without focal abnormality. Adrenals/Urinary Tract: Adrenal glands are within normal limits. No focal renal abnormalities. Small cystocele. Bladder otherwise unremarkable. Stomach/Bowel: The stomach is nonenlarged. Small hiatal hernia. There are mildly dilated loops of fluid-filled small bowel within the central and anterior abdomen measuring up to 3.3 cm in diameter. Bowel loops appear adhesed to the anterior abdominal wall. At least 1 transition point visualized along the left upper anterior abdominal wall, series 2, image number 53, findings are likely secondary to adhesions. Contrast filled nondilated distal small bowel. No contrast in the colon. No colon wall thickening. Vascular/Lymphatic: Atherosclerotic vascular calcifications. No significantly enlarged lymph nodes. Reproductive: Status post hysterectomy. No adnexal masses. Other: Evidence of prior ventral hernia repair. No free air or free fluid. Musculoskeletal: Degenerative changes of the spine. No acute or suspicious bone lesions. IMPRESSION: 1. Multiple loops of mildly dilated small bowel up to 3.3 cm. Dilated small bowel loops appear adhesed to the anterior abdominal wall with at least 1 transition point visualized within the left anterior abdomen. Findings are suspicious for a bowel obstruction, likely related to adhesions. 2. Gallstones. Small stone in the cystic  duct. No evidence for biliary dilatation or gallbladder inflammation. Electronically Signed   By: Donavan Foil M.D.   On: 07/02/2016 23:22    Procedures Procedures (including critical care time)  Medications Ordered in ED Medications  ondansetron [facility]) injection 4 mg (4 mg Intravenous Given 07/02/16 2158)  morphine 4 MG/ML injection 4 mg (4 mg Intravenous Given 07/02/16 2159)  sodium chloride 0.9 % bolus 500 mL (0 mLs Intravenous Stopped 07/02/16 2240)  iopamidol (ISOVUE-300) 61 % injection 100 mL (100 mLs Intravenous Contrast Given 07/02/16 2250)     Initial Impression / Assessment and Plan / ED Course  I have reviewed the triage vital signs and the nursing notes.  Pertinent labs & imaging results that were available during my care of  the patient were reviewed by me and considered in my medical decision making (see chart for details).  Clinical Course    Discussed with Dr. Arnoldo Morale. Will see in consult. Dr. Dena Billet, hospitalist will admit. Patient states she is feeling much better after IV morphine and IV fluids. Vital signs remained stable.   Final Clinical Impressions(s) / ED Diagnoses   Final diagnoses:  SBO (small bowel obstruction)    New Prescriptions New Prescriptions   No medications on file   I personally performed the services described in this documentation, which was scribed in my presence. The recorded information has been reviewed and is accurate.      Linda Rice, MD 07/02/16 2350

## 2016-07-02 NOTE — ED Notes (Signed)
Patient transported to CT 

## 2016-07-03 ENCOUNTER — Inpatient Hospital Stay (HOSPITAL_COMMUNITY): Payer: Medicare Other

## 2016-07-03 ENCOUNTER — Encounter (HOSPITAL_COMMUNITY): Payer: Self-pay

## 2016-07-03 LAB — CBC
HCT: 38 % (ref 36.0–46.0)
HEMOGLOBIN: 12.6 g/dL (ref 12.0–15.0)
MCH: 30.1 pg (ref 26.0–34.0)
MCHC: 33.2 g/dL (ref 30.0–36.0)
MCV: 90.9 fL (ref 78.0–100.0)
Platelets: 285 10*3/uL (ref 150–400)
RBC: 4.18 MIL/uL (ref 3.87–5.11)
RDW: 13.8 % (ref 11.5–15.5)
WBC: 13.5 10*3/uL — ABNORMAL HIGH (ref 4.0–10.5)

## 2016-07-03 LAB — COMPREHENSIVE METABOLIC PANEL
ALK PHOS: 80 U/L (ref 38–126)
ALT: 12 U/L — ABNORMAL LOW (ref 14–54)
ANION GAP: 9 (ref 5–15)
AST: 17 U/L (ref 15–41)
Albumin: 3.8 g/dL (ref 3.5–5.0)
BUN: 21 mg/dL — ABNORMAL HIGH (ref 6–20)
CALCIUM: 9 mg/dL (ref 8.9–10.3)
CO2: 27 mmol/L (ref 22–32)
Chloride: 101 mmol/L (ref 101–111)
Creatinine, Ser: 0.82 mg/dL (ref 0.44–1.00)
GFR calc non Af Amer: >60 mL/min (ref >60)
Glucose, Bld: 162 mg/dL — ABNORMAL HIGH (ref 65–99)
POTASSIUM: 3.7 mmol/L (ref 3.5–5.1)
SODIUM: 137 mmol/L (ref 135–145)
TOTAL PROTEIN: 6.6 g/dL (ref 6.5–8.1)
Total Bilirubin: 0.8 mg/dL (ref 0.3–1.2)

## 2016-07-03 LAB — MRSA PCR SCREENING: MRSA BY PCR: NEGATIVE

## 2016-07-03 LAB — GLUCOSE, CAPILLARY
GLUCOSE-CAPILLARY: 116 mg/dL — AB (ref 65–99)
Glucose-Capillary: 133 mg/dL — ABNORMAL HIGH (ref 65–99)

## 2016-07-03 LAB — MAGNESIUM: MAGNESIUM: 1.5 mg/dL — AB (ref 1.7–2.4)

## 2016-07-03 MED ORDER — SODIUM CHLORIDE 0.9 % IV SOLN
INTRAVENOUS | Status: AC
  Administered 2016-07-03: 02:00:00 via INTRAVENOUS

## 2016-07-03 MED ORDER — ONDANSETRON HCL 4 MG/2ML IJ SOLN
4.0000 mg | Freq: Four times a day (QID) | INTRAMUSCULAR | Status: DC | PRN
  Administered 2016-07-03 – 2016-07-04 (×2): 4 mg via INTRAVENOUS
  Filled 2016-07-03 (×2): qty 2

## 2016-07-03 MED ORDER — INSULIN ASPART 100 UNIT/ML ~~LOC~~ SOLN
0.0000 [IU] | Freq: Three times a day (TID) | SUBCUTANEOUS | Status: DC
Start: 2016-07-03 — End: 2016-07-08
  Administered 2016-07-03 – 2016-07-08 (×11): 1 [IU] via SUBCUTANEOUS

## 2016-07-03 MED ORDER — ENOXAPARIN SODIUM 40 MG/0.4ML ~~LOC~~ SOLN
40.0000 mg | SUBCUTANEOUS | Status: DC
  Administered 2016-07-03 – 2016-07-08 (×6): 40 mg via SUBCUTANEOUS
  Filled 2016-07-03 (×6): qty 0.4

## 2016-07-03 MED ORDER — MORPHINE SULFATE (PF) 4 MG/ML IV SOLN
4.0000 mg | Freq: Once | INTRAVENOUS | Status: AC
  Administered 2016-07-03: 4 mg via INTRAVENOUS
  Filled 2016-07-03: qty 1

## 2016-07-03 MED ORDER — HYDRALAZINE HCL 20 MG/ML IJ SOLN: 10.0000 mg | Freq: Four times a day (QID) | INTRAMUSCULAR | Status: DC | PRN

## 2016-07-03 MED ORDER — ENALAPRILAT 1.25 MG/ML IV SOLN
1.2500 mg | Freq: Four times a day (QID) | INTRAVENOUS | Status: DC
  Administered 2016-07-04: 1.25 mg via INTRAVENOUS
  Filled 2016-07-03 (×2): qty 2

## 2016-07-03 MED ORDER — METOPROLOL TARTRATE 5 MG/5ML IV SOLN
5.0000 mg | Freq: Four times a day (QID) | INTRAVENOUS | Status: DC
  Administered 2016-07-04 – 2016-07-05 (×4): 5 mg via INTRAVENOUS
  Filled 2016-07-03 (×5): qty 5

## 2016-07-03 MED ORDER — MORPHINE SULFATE (PF) 2 MG/ML IV SOLN
2.0000 mg | INTRAVENOUS | Status: DC | PRN
  Administered 2016-07-03 – 2016-07-04 (×6): 2 mg via INTRAVENOUS
  Filled 2016-07-03 (×6): qty 1

## 2016-07-03 MED ORDER — DIATRIZOATE MEGLUMINE & SODIUM 66-10 % PO SOLN
ORAL | Status: AC
  Administered 2016-07-03: 90 mL via NASOGASTRIC
  Filled 2016-07-03: qty 90

## 2016-07-03 MED ORDER — POLYVINYL ALCOHOL 1.4 % OP SOLN
1.0000 [drp] | Freq: Two times a day (BID) | OPHTHALMIC | Status: DC
  Administered 2016-07-03 – 2016-07-08 (×11): 1 [drp] via OPHTHALMIC
  Filled 2016-07-03: qty 15

## 2016-07-03 MED ORDER — MAGNESIUM SULFATE 2 GM/50ML IV SOLN
2.0000 g | Freq: Once | INTRAVENOUS | Status: AC
  Administered 2016-07-03: 2 g via INTRAVENOUS
  Filled 2016-07-03: qty 50

## 2016-07-03 MED ORDER — LORAZEPAM 2 MG/ML IJ SOLN
0.5000 mg | INTRAMUSCULAR | Status: DC | PRN
  Administered 2016-07-03 – 2016-07-07 (×5): 0.5 mg via INTRAVENOUS
  Filled 2016-07-03 (×5): qty 1

## 2016-07-03 MED ORDER — DIATRIZOATE MEGLUMINE & SODIUM 66-10 % PO SOLN
90.0000 mL | Freq: Once | ORAL | Status: AC
  Administered 2016-07-03: 90 mL via NASOGASTRIC
  Filled 2016-07-03: qty 90

## 2016-07-03 MED ORDER — PANTOPRAZOLE SODIUM 40 MG IV SOLR
40.0000 mg | Freq: Two times a day (BID) | INTRAVENOUS | Status: DC
  Administered 2016-07-03 – 2016-07-05 (×5): 40 mg via INTRAVENOUS
  Filled 2016-07-03 (×5): qty 40

## 2016-07-03 MED ORDER — PROCHLORPERAZINE EDISYLATE 5 MG/ML IJ SOLN: 10.0000 mg | INTRAMUSCULAR | Status: DC | PRN

## 2016-07-03 NOTE — Progress Notes (Signed)
PROGRESS NOTE    [name]  [id number]  DOB: 09-02-1931  DOA: 07/02/2016 PCP: Sallee Lange, MD  Hospital course: Patient is an 83 year old white female who presented emergency room with a 24-hour history of worsening nausea and vomiting. CT scan the abdomen revealed a partial small bowel obstruction. She states she had a similar episode over one year ago and it resolved without surgery. She does currently have some abdominal discomfort, though this was eased with morphine. An NG tube has been placed on admission.  Surgery has been consulted and following.   Assessment & Plan:   SBO (small bowel obstruction) With transition point. Patient has had a small bowel obstruction before, approximately one and half years ago, resolved without surgery. Small bowel obstruction protocol. NG tube placement, although the patient is severely reluctant. Patient states that she worked for [employer] for many years and that if she needs surgical evaluation she wants to be evaluated by the team in [place]. She mentioned Dr. Alphonsa Overall in particular. She is agreeable to trying initial conservative management here in [place] assistance from general surgery team.    Nausea and vomiting PRN Zofran, compazine.  Controlled type 2 diabetes mellitus without complication, without long-term current use of insulin ([place]) Hold oral diabetes medications.  Check fingerstick blood sugars q ac and hs.  Sliding scale insulin.   CBG (last 3)   Recent Labs  07/02/16 2057 07/03/16 1209  GLUCAP 148* 116*   Essential hypertension Will need to use IV anti-hypertensive medications instead of her oral meds. IV metoprolol, hydralazine, and enalaprilat.   DVT prophylaxis: Lovenox.  Code Status:  Full   Family Communication: None Disposition Plan: Home Consults called: None Admission status: Inpatient  Subjective: Pt tolerating the NG tube so far and  morphine is helping with pain control.   Objective: Vitals:   07/02/16 2200 07/03/16 0030 07/03/16 0100 07/03/16 0646  BP: 143/57 136/56 (!) 128/52 (!) 132/46  Pulse: 85 91 76 83  Resp:    18  Temp:    98.5 F (36.9 C)  TempSrc:    Oral  SpO2: 98% 98% 94% 94%  Weight:      Height:        Intake/Output Summary (Last 24 hours) at 07/03/16 1234 Last data filed at 07/03/16 0900  Gross per 24 hour  Intake              945 ml  Output              300 ml  Net              645 ml   Filed Weights   07/02/16 2054  Weight: 78.5 kg (173 lb)    Exam:  General exam: awake, alert, NAD. Cooperative.  Respiratory system: Clear. No increased work of breathing. Cardiovascular system: S1 & S2 heard, No JVD, murmurs, gallops, clicks or pedal edema. Gastrointestinal system: Abdomen is nondistended, soft and nontender. Normal bowel sounds heard. Central nervous system: Alert and oriented. No focal neurological deficits. Extremities: no Cyanosis.  Data Reviewed: Basic Metabolic Panel:  Recent Labs Lab 07/02/16 2157 07/03/16 0526  NA 137 137  K 3.6 3.7  CL 98* 101  CO2 25 27  GLUCOSE 170* 162*  BUN 23* 21*  CREATININE 0.97 0.82  CALCIUM 9.8 9.0  MG 1.5*  --    Liver Function Tests:  Recent Labs Lab 07/02/16 2157 07/03/16 0526  AST 22 17  ALT  15 12*  ALKPHOS 91 80  BILITOT 0.8 0.8  PROT 7.2 6.6  ALBUMIN 4.1 3.8    Recent Labs Lab 07/02/16 2157  LIPASE 28   No results for input(s): AMMONIA in the last 168 hours. CBC:  Recent Labs Lab 07/02/16 2157 07/03/16 0526  WBC 14.7* 13.5*  HGB 13.2 12.6  HCT 40.4 38.0  MCV 90.0 90.9  PLT 296 285   Cardiac Enzymes: No results for input(s): CKTOTAL, CKMB, CKMBINDEX, TROPONINI in the last 168 hours. CBG (last 3)   Recent Labs  07/02/16 2057 07/03/16 1209  GLUCAP 148* 116*   Recent Results (from the past 240 hour(s))  MRSA PCR Screening     Status: None   Collection Time: 07/03/16  1:57 AM  Result Value Ref  Range Status   MRSA by PCR NEGATIVE NEGATIVE Final    Comment:        The GeneXpert MRSA Assay (FDA approved for NASAL specimens only), is one component of a comprehensive MRSA colonization surveillance program. It is not intended to diagnose MRSA infection nor to guide or monitor treatment for MRSA infections.      Studies: Ct Abdomen Pelvis W Contrast  Result Date: 07/02/2016 CLINICAL DATA:  Non radiating waxing and waning abdominal pain EXAM: CT ABDOMEN AND PELVIS WITH CONTRAST TECHNIQUE: Multidetector CT imaging of the abdomen and pelvis was performed using the standard protocol following bolus administration of intravenous contrast. CONTRAST:  179mL ISOVUE-300 IOPAMIDOL (ISOVUE-300) INJECTION 61% COMPARISON:  06/18/2015 FINDINGS: Lower chest: Mild atelectasis in the right middle lobe. No acute consolidation or pleural effusion. Heart size upper normal. Trace pericardial effusion. Hepatobiliary: Calcified liver granuloma. No intra hepatic biliary dilatation. Multiple calcified stones in the gallbladder, no wall thickening. Small calcified stone in the cystic duct. Pancreas: Unremarkable. No pancreatic ductal dilatation or surrounding inflammatory changes. Spleen: Normal in size without focal abnormality. Adrenals/Urinary Tract: Adrenal glands are within normal limits. No focal renal abnormalities. Small cystocele. Bladder otherwise unremarkable. Stomach/Bowel: The stomach is nonenlarged. Small hiatal hernia. There are mildly dilated loops of fluid-filled small bowel within the central and anterior abdomen measuring up to 3.3 cm in diameter. Bowel loops appear adhesed to the anterior abdominal wall. At least 1 transition point visualized along the left upper anterior abdominal wall, series 2, image number 53, findings are likely secondary to adhesions. Contrast filled nondilated distal small bowel. No contrast in the colon. No colon wall thickening. Vascular/Lymphatic: Atherosclerotic vascular  calcifications. No significantly enlarged lymph nodes. Reproductive: Status post hysterectomy. No adnexal masses. Other: Evidence of prior ventral hernia repair. No free air or free fluid. Musculoskeletal: Degenerative changes of the spine. No acute or suspicious bone lesions. IMPRESSION: 1. Multiple loops of mildly dilated small bowel up to 3.3 cm. Dilated small bowel loops appear adhesed to the anterior abdominal wall with at least 1 transition point visualized within the left anterior abdomen. Findings are suspicious for a bowel obstruction, likely related to adhesions. 2. Gallstones. Small stone in the cystic duct. No evidence for biliary dilatation or gallbladder inflammation. Electronically Signed   By: Donavan Foil M.D.   On: 07/02/2016 23:22   Dg Abd Portable 1v-small Bowel Protocol-position Verification  Result Date: 07/03/2016 CLINICAL DATA:  Nasogastric tube placement EXAM: PORTABLE ABDOMEN - 1 VIEW COMPARISON:  None. FINDINGS: The nasogastric tube extends well into the stomach with tip in the region of the mid gastric body. Moderately dilated small bowel loops are again evident. IMPRESSION: Nasogastric tube extends well into the stomach. Electronically  Signed   By: Andreas Newport M.D.   On: 07/03/2016 03:35   Scheduled Meds: . sodium chloride   Intravenous STAT  . enalaprilat  1.25 mg Intravenous Q6H  . enoxaparin (LOVENOX) injection  40 mg Subcutaneous Q24H  . insulin aspart  0-9 Units Subcutaneous TID WC  . metoprolol  5 mg Intravenous Q6H  . pantoprazole (PROTONIX) IV  40 mg Intravenous Q12H  . polyvinyl alcohol  1 drop Both Eyes BID   Continuous Infusions:  Principal Problem:   SBO (small bowel obstruction) Active Problems:   Controlled type 2 diabetes mellitus without complication, without long-term current use of insulin ([facility])   Nausea and vomiting   Essential hypertension  Time spent:   Irwin Brakeman, MD, FAAFP [facility] Pager [phone]  If 7PM-7AM,  please contact night-coverage [email or web address] Password [email or web address] 07/03/2016, 12:34 PM    LOS: 1 day

## 2016-07-03 NOTE — Assessment & Plan Note (Signed)
With transition point. Patient has had a small bowel obstruction before, approximately one and half years ago, resolved without surgery. Plan: Small bowel obstruction protocol. NG tube placement, although the patient is severely reluctant. Patient states that she worked for [employer] for many years and that if she needs surgical evaluation she wants to be evaluated by the team in [place]. She mentioned Dr. Alphonsa Overall in particular. She is agreeable to trying initial conservative management here in [facility].

## 2016-07-03 NOTE — Assessment & Plan Note (Signed)
Will need to use IV anti-hypertensive medications instead of her oral meds. Plan: IV metoprolol, hydralazine, and enalaprilat. Titrate.

## 2016-07-03 NOTE — Progress Notes (Signed)
Report given to oncoming nurse. Nothing needed at this time. 

## 2016-07-03 NOTE — H&P (Signed)
History and Physical    Patient:  [name] [id number] 09-23-1931   Date of Admission:  07/02/2016  PRIMARY CARE PROVIDER (PCP): Sallee Lange, MD   Outpatient Specialists:  Surgeon: Dr. Alphonsa Overall  Patient coming from: home   Chief Complaint: abdominal pain  HPI:  The patient is an 83 yo woman with hypertension, history of appendectomy, cholecystectomy, hemicolectomy due to diverticulitis, and hysterectomy, with history of small bowel obstruction one and half years ago, who presents with abdominal pain. Onset: This evening after dinner. Duration: intermittent. Timing: Started after eating evening meal. Severity: Severe. Context: After eating.  Location: Lower abdomen and periumbilical area.  Radiation: none Character/Quality: 10/10, "gassy pain." Alleviated by: Nothing. Exacerbated by: Nothing. Associated Symptoms: Vomiting x 2 green fluid. Nausea. No diarrhea, constipation, or bloody stool. Last bowel movement early this morning and was normal.  Treatments: none at home except usual medications.   ED Course: The patient was evaluated with CT scan and found to have a small bowel obstruction with transition point. She is given IV fluids and IV morphine.   Past Medical History:  Diagnosis Date  . Diabetes mellitus without complication ([place])   . Hypertension     Past Surgical History:  Procedure Laterality Date  . ABDOMINAL HYSTERECTOMY    . APPENDECTOMY    . BREAST REDUCTION SURGERY    . CARDIAC CATHETERIZATION  06/01/2003   EF is between 60-65% -- Smooth and normal coronary arteries -- Normal left ventricular systolic function  -- We will continue with medical therapy for her hypertensio  . COLON SURGERY     left  . EYE SURGERY  03/30/2009   cataract  . KNEE SURGERY    . LAPAROSCOPIC INCISIONAL / UMBILICAL / VENTRAL HERNIA REPAIR  02/23/2007   . REFRACTIVE SURGERY      Allergies  Allergen Reactions  . Phenergan [Promethazine Hcl]     hallucinations    . Prednisone     rash  . Statins     Severe myalgias    No current facility-administered medications on file prior to encounter.    Current Outpatient Prescriptions on File Prior to Encounter  Medication Sig Dispense Refill  . ALPRAZolam (XANAX) 0.25 MG tablet TAKE ONE TABLET BY MOUTH ONCE DAILY AT BEDTIME 30 tablet 5  . amLODipine (NORVASC) 10 MG tablet Take 1 tablet (10 mg total) by mouth daily. 90 tablet 1  . aspirin 81 MG tablet Take 81 mg by mouth daily.      . beta carotene w/minerals (OCUVITE) tablet Take 1 tablet by mouth daily.    . Cholecalciferol (VITAMIN D PO) Take by mouth. Vit d3 1,00 iu daily    . cyanocobalamin 2000 MCG tablet Take 2,000 mcg by mouth daily.    . hydrALAZINE (APRESOLINE) 25 MG tablet 1 q8 hours if systolic greater than [id number] (Patient taking differently: Take 25 mg by mouth every 8 (eight) hours as needed. 1 q8 hours if systolic greater than [id number]) 30 tablet 1  . indapamide (LOZOL) 2.5 MG tablet Take 1 tablet (2.5 mg total) by mouth daily. 90 tablet 0  . losartan (COZAAR) 100 MG tablet Take 1 tablet (100 mg total) by mouth daily. 30 tablet 6  . metFORMIN (GLUCOPHAGE) 1000 MG tablet TAKE ONE TABLET BY MOUTH TWICE DAILY **INCREASED  DOSE** (Patient taking differently: TAKE ONE TABLET BY MOUTH TWICE DAILY) 180 tablet 1  . polyvinyl alcohol (LIQUIFILM TEARS) 1.4 % ophthalmic solution Place 1 drop into both eyes  2 (two) times daily.       Social History   Social History  . Marital status: Widowed    Spouse name: N/A  . Number of children: N/A  . Years of education: N/A   Occupational History  . Not on file.   Social History Main Topics  . Smoking status: Never Smoker  . Smokeless tobacco: Never Used  . Alcohol use No  . Drug use: No  . Sexual activity: Not on file   Other Topics Concern  . Not on file   Social History Narrative  . No narrative on file    Family History  Problem Relation Age of Onset  . Hypertension Father     kidney  .  Stroke Father   . Cancer Father   . Cancer Mother     ovaian  . Diabetes Brother   . Hypertension Sister      Review of Systems:   ROS: GENERAL: No Fever, chills, or diaphoresis. Positive for fatigue/malaise.  HEENT: No nasal discharge or bleeding. No throat pain or swelling. No eye pain or eye redness.  RESPIRATORY: No cough, wheezing, or shortness of breath.  CARDIOVASCULAR: No chest pain or palpitations.  GI: See HPI. NEUROLOGICAL: No headache or focal weakness.  INTEGUMENT: no rashes, itching, or new lesions.  LYMPHATIC SYSTEM: no lymph node swelling or pain.  MUSCULOSKELETAL: no joint pain or joint swelling.  GENITOURINARY: No dysuria or hematuria.  ENDOCRINE: No polyuria or polydipsia.  HEME: No chronic anemia, bleeding, or easy bruising.   Physical Exam:  Vitals:   07/02/16 2052 07/02/16 2054 07/02/16 2200 07/03/16 0030  BP: (!) 150/50  143/57 136/56  Pulse: 100  85 91  Resp: 19     Temp: 98.2 F (36.8 C)     TempSrc: Temporal     SpO2: 100%  98% 98%  Weight:  78.5 kg (173 lb)    Height:  5\' 8"  (1.727 m)      GENERAL: Ill-appearing, well nourished, well-developed.  HEENT: Normocephalic, atraumatic; pupils equal and round. Nares patent, without discharge or bleeding. No oropharyngeal lesions or erythema. Mucous membranes are dry.  NECK: is supple, no masses, trachea midline.  RESPIRATORY: Clear to auscultation bilaterally. Chest wall movements are symmetric. No use of accessory muscles to breathe.  No wheezing, rales, rhonchi. CARDIOVASCULAR: Normal S1, S2. No rubs, or gallops. PMI non-displaced. Carotids: no carotid bruits. No bradycardia or tachycardia. DP pulses 2+ bilaterally.  GI: soft, mildly distended, normal active bowel sounds. No hepatosplenomegaly.  Tenderness is generalized. No rebound or guarding. INTEGUMENT: Clean, dry, and intact. No rashes. MUSCULOSKELETAL: Moving all extremities. No cyanosis. No clubbing. Edema: none bilaterally.  NEUROLOGICAL:  Cranial nerves 2-12 grossly intact. Reflexes: 2+ bilaterally. Babinski: toes downgoing bilaterally. Motor 5/5 throughout. Sensory grossly intact to light touch. Intact rapid alternating movements bilaterally. No pronator drift.  PSYCHIATRIC: Fully oriented. Normal and appropriate affect.  LYMPHATIC: No cervical lymphadenopathy. No supraclavicular lymphadenopathy.   Labs on Admission: I have personally reviewed following labs and imaging studies.  Results for orders placed or performed during the hospital encounter of 07/02/16 (from the past 24 hour(s))  Urinalysis, Routine w reflex microscopic   Collection Time: 07/02/16  8:56 PM  Result Value Ref Range   Color, Urine YELLOW YELLOW   APPearance HAZY (A) CLEAR   Specific Gravity, Urine 1.009 1.005 - 1.030   pH 7.0 5.0 - 8.0   Glucose, UA NEGATIVE NEGATIVE mg/dL   Hgb urine dipstick NEGATIVE NEGATIVE  Bilirubin Urine NEGATIVE NEGATIVE   Ketones, ur NEGATIVE NEGATIVE mg/dL   Protein, ur NEGATIVE NEGATIVE mg/dL   Nitrite POSITIVE (A) NEGATIVE   Leukocytes, UA LARGE (A) NEGATIVE  Urinalysis, Microscopic (reflex)   Collection Time: 07/02/16  8:56 PM  Result Value Ref Range   RBC / HPF 0-5 0 - 5 RBC/hpf   WBC, UA TOO NUMEROUS TO COUNT 0 - 5 WBC/hpf   Bacteria, UA RARE (A) NONE SEEN   Squamous Epithelial / LPF 0-5 (A) NONE SEEN   Urine-Other YEAST   POC CBG, ED   Collection Time: 07/02/16  8:57 PM  Result Value Ref Range   Glucose-Capillary 148 (H) 65 - 99 mg/dL  Lipase, blood   Collection Time: 07/02/16  9:57 PM  Result Value Ref Range   Lipase 28 11 - 51 U/L  Comprehensive metabolic panel   Collection Time: 07/02/16  9:57 PM  Result Value Ref Range   Sodium 137 135 - 145 mmol/L   Potassium 3.6 3.5 - 5.1 mmol/L   Chloride 98 (L) 101 - 111 mmol/L   CO2 25 22 - 32 mmol/L   Glucose, Bld 170 (H) 65 - 99 mg/dL   BUN 23 (H) 6 - 20 mg/dL   Creatinine, Ser 0.97 0.44 - 1.00 mg/dL   Calcium 9.8 8.9 - 10.3 mg/dL   Total Protein 7.2  6.5 - 8.1 g/dL   Albumin 4.1 3.5 - 5.0 g/dL   AST 22 15 - 41 U/L   ALT 15 14 - 54 U/L   Alkaline Phosphatase 91 38 - 126 U/L   Total Bilirubin 0.8 0.3 - 1.2 mg/dL   GFR calc non Af Amer 53 (L) >60 mL/min   GFR calc Af Amer >60 >60 mL/min   Anion gap 14 5 - 15  CBC   Collection Time: 07/02/16  9:57 PM  Result Value Ref Range   WBC 14.7 (H) 4.0 - 10.5 K/uL   RBC 4.49 3.87 - 5.11 MIL/uL   Hemoglobin 13.2 12.0 - 15.0 g/dL   HCT 40.4 36.0 - 46.0 %   MCV 90.0 78.0 - 100.0 fL   MCH 29.4 26.0 - 34.0 pg   MCHC 32.7 30.0 - 36.0 g/dL   RDW 13.4 11.5 - 15.5 %   Platelets 296 150 - 400 K/uL        Radiological Exams on Admission:  CT abdomen/pelvis: IMPRESSION: 1. Multiple loops of mildly dilated small bowel up to 3.3 cm. Dilated small bowel loops appear adhesed to the anterior abdominal wall with at least 1 transition point visualized within the left anterior abdomen. Findings are suspicious for a bowel obstruction, likely related to adhesions. 2. Gallstones. Small stone in the cystic duct. No evidence for biliary dilatation or gallbladder inflammation.    Assessment/Plan  SBO (small bowel obstruction) With transition point. Patient has had a small bowel obstruction before, approximately one and half years ago, resolved without surgery. Plan: Small bowel obstruction protocol. NG tube placement, although the patient is severely reluctant. Patient states that she worked for [employer] for many years and that if she needs surgical evaluation she wants to be evaluated by the team in [place]. She mentioned Dr. Alphonsa Overall in particular. She is agreeable to trying initial conservative management here in [facility].  Nausea and vomiting PRN Zofran, compazine.  Controlled type 2 diabetes mellitus without complication, without long-term current use of insulin ([place]) Plan: Hold oral diabetes medications.  Check fingerstick blood sugars q ac  and hs.    Sliding scale insulin.     Essential hypertension Will need to use IV anti-hypertensive medications instead of her oral meds. Plan: IV metoprolol, hydralazine, and enalaprilat. Titrate.        DVT prophylaxis: Lovenox.  Code Status:  Full   Family Communication: None Disposition Plan: Home Consults called: None Admission status: Inpatient   Patient requires admission due to risks, which include: Death, perforation, worsening pain and suffering.   Tacey Ruiz MD [facility] Pager [phone]

## 2016-07-03 NOTE — Consult Note (Signed)
Reason for Consult: Small bowel obstruction Referring Physician: Dr. Pennie Banter NYLIA [name] is an 83 y.o. female.  HPI: Patient is an 83 year old white female who presented emergency room with a 24-hour history of worsening nausea and vomiting. CT scan the abdomen revealed a partial small bowel obstruction. She states she had a similar episode over one year ago and it resolved without surgery. She does currently have some abdominal discomfort, though this was eased with morphine. An NG tube has been placed. She is not past flatus or had a bowel movement yet.  Past Medical History:  Diagnosis Date  . Diabetes mellitus without complication ([place])   . Hypertension     Past Surgical History:  Procedure Laterality Date  . ABDOMINAL HYSTERECTOMY    . APPENDECTOMY    . BREAST REDUCTION SURGERY    . CARDIAC CATHETERIZATION    . COLON SURGERY Left    Hemicolectomy due to diverticulitis  . EYE SURGERY  03/30/2009   cataract  . KNEE SURGERY    . LAPAROSCOPIC INCISIONAL / UMBILICAL / VENTRAL HERNIA REPAIR  02/23/2007   . REFRACTIVE SURGERY      Family History  Problem Relation Age of Onset  . Hypertension Father     kidney  . Stroke Father   . Cancer Father   . Cancer Mother     ovaian  . Diabetes Brother   . Hypertension Sister     Social History:  reports that she has never smoked. She has never used smokeless tobacco. She reports that she does not drink alcohol or use drugs.  Allergies:  Allergies  Allergen Reactions  . Phenergan [Promethazine Hcl]     hallucinations  . Prednisone     rash  . Statins     Severe myalgias    Medications:  Prior to Admission:  Prescriptions Prior to Admission  Medication Sig Dispense Refill Last Dose  . ALPRAZolam (XANAX) 0.25 MG tablet TAKE ONE TABLET BY MOUTH ONCE DAILY AT BEDTIME 30 tablet 5 07/01/2016 at Unknown time  . amLODipine (NORVASC) 10 MG tablet Take 1 tablet (10 mg total) by mouth daily. 90 tablet 1 07/02/2016 at Unknown time   . aspirin 81 MG tablet Take 81 mg by mouth daily.     07/02/2016 at Unknown time  . beta carotene w/minerals (OCUVITE) tablet Take 1 tablet by mouth daily.   unknown  . Cholecalciferol (VITAMIN D PO) Take by mouth. Vit d3 1,00 iu daily   unknown  . cyanocobalamin 2000 MCG tablet Take 2,000 mcg by mouth daily.   unknown  . hydrALAZINE (APRESOLINE) 25 MG tablet 1 q8 hours if systolic greater than 242 (Patient taking differently: Take 25 mg by mouth every 8 (eight) hours as needed. 1 q8 hours if systolic greater than 353) 30 tablet 1 unknown  . indapamide (LOZOL) 2.5 MG tablet Take 1 tablet (2.5 mg total) by mouth daily. 90 tablet 0 07/02/2016 at Unknown time  . losartan (COZAAR) 100 MG tablet Take 1 tablet (100 mg total) by mouth daily. 30 tablet 6 07/02/2016 at Unknown time  . metFORMIN (GLUCOPHAGE) 1000 MG tablet TAKE ONE TABLET BY MOUTH TWICE DAILY **INCREASED  DOSE** (Patient taking differently: TAKE ONE TABLET BY MOUTH TWICE DAILY) 180 tablet 1 07/02/2016 at Unknown time  . polyvinyl alcohol (LIQUIFILM TEARS) 1.4 % ophthalmic solution Place 1 drop into both eyes 2 (two) times daily.   unknown   Scheduled: . sodium chloride   Intravenous STAT  . enalaprilat  1.25 mg Intravenous Q6H  . enoxaparin (LOVENOX) injection  40 mg Subcutaneous Q24H  . insulin aspart  0-9 Units Subcutaneous TID WC  . metoprolol  5 mg Intravenous Q6H  . pantoprazole (PROTONIX) IV  40 mg Intravenous Q12H  . polyvinyl alcohol  1 drop Both Eyes BID    Results for orders placed or performed during the hospital encounter of 07/02/16 (from the past 48 hour(s))  Urinalysis, Routine w reflex microscopic     Status: Abnormal   Collection Time: 07/02/16  8:56 PM  Result Value Ref Range   Color, Urine YELLOW YELLOW   APPearance HAZY (A) CLEAR   Specific Gravity, Urine 1.009 1.005 - 1.030   pH 7.0 5.0 - 8.0   Glucose, UA NEGATIVE NEGATIVE mg/dL   Hgb urine dipstick NEGATIVE NEGATIVE   Bilirubin Urine NEGATIVE NEGATIVE   Ketones,  ur NEGATIVE NEGATIVE mg/dL   Protein, ur NEGATIVE NEGATIVE mg/dL   Nitrite POSITIVE (A) NEGATIVE   Leukocytes, UA LARGE (A) NEGATIVE  Urinalysis, Microscopic (reflex)     Status: Abnormal   Collection Time: 07/02/16  8:56 PM  Result Value Ref Range   RBC / HPF 0-5 0 - 5 RBC/hpf   WBC, UA TOO NUMEROUS TO COUNT 0 - 5 WBC/hpf   Bacteria, UA RARE (A) NONE SEEN   Squamous Epithelial / LPF 0-5 (A) NONE SEEN   Urine-Other YEAST   POC CBG, ED     Status: Abnormal   Collection Time: 07/02/16  8:57 PM  Result Value Ref Range   Glucose-Capillary 148 (H) 65 - 99 mg/dL  Lipase, blood     Status: None   Collection Time: 07/02/16  9:57 PM  Result Value Ref Range   Lipase 28 11 - 51 U/L  Comprehensive metabolic panel     Status: Abnormal   Collection Time: 07/02/16  9:57 PM  Result Value Ref Range   Sodium 137 135 - 145 mmol/L   Potassium 3.6 3.5 - 5.1 mmol/L   Chloride 98 (L) 101 - 111 mmol/L   CO2 25 22 - 32 mmol/L   Glucose, Bld 170 (H) 65 - 99 mg/dL   BUN 23 (H) 6 - 20 mg/dL   Creatinine, Ser 0.97 0.44 - 1.00 mg/dL   Calcium 9.8 8.9 - 10.3 mg/dL   Total Protein 7.2 6.5 - 8.1 g/dL   Albumin 4.1 3.5 - 5.0 g/dL   AST 22 15 - 41 U/L   ALT 15 14 - 54 U/L   Alkaline Phosphatase 91 38 - 126 U/L   Total Bilirubin 0.8 0.3 - 1.2 mg/dL   GFR calc non Af Amer 53 (L) >60 mL/min   GFR calc Af Amer >60 >60 mL/min    Comment: (NOTE) The eGFR has been calculated using the CKD EPI equation. This calculation has not been validated in all clinical situations. eGFR's persistently <60 mL/min signify possible Chronic Kidney Disease.    Anion gap 14 5 - 15  CBC     Status: Abnormal   Collection Time: 07/02/16  9:57 PM  Result Value Ref Range   WBC 14.7 (H) 4.0 - 10.5 K/uL   RBC 4.49 3.87 - 5.11 MIL/uL   Hemoglobin 13.2 12.0 - 15.0 g/dL   HCT 40.4 36.0 - 46.0 %   MCV 90.0 78.0 - 100.0 fL   MCH 29.4 26.0 - 34.0 pg   MCHC 32.7 30.0 - 36.0 g/dL   RDW 13.4 11.5 - 15.5 %   Platelets 296 150 -  400 K/uL   Magnesium     Status: Abnormal   Collection Time: 07/02/16  9:57 PM  Result Value Ref Range   Magnesium 1.5 (L) 1.7 - 2.4 mg/dL  Comprehensive metabolic panel     Status: Abnormal   Collection Time: 07/03/16  5:26 AM  Result Value Ref Range   Sodium 137 135 - 145 mmol/L   Potassium 3.7 3.5 - 5.1 mmol/L   Chloride 101 101 - 111 mmol/L   CO2 27 22 - 32 mmol/L   Glucose, Bld 162 (H) 65 - 99 mg/dL   BUN 21 (H) 6 - 20 mg/dL   Creatinine, Ser 0.82 0.44 - 1.00 mg/dL   Calcium 9.0 8.9 - 10.3 mg/dL   Total Protein 6.6 6.5 - 8.1 g/dL   Albumin 3.8 3.5 - 5.0 g/dL   AST 17 15 - 41 U/L   ALT 12 (L) 14 - 54 U/L   Alkaline Phosphatase 80 38 - 126 U/L   Total Bilirubin 0.8 0.3 - 1.2 mg/dL   GFR calc non Af Amer >60 >60 mL/min   GFR calc Af Amer >60 >60 mL/min    Comment: (NOTE) The eGFR has been calculated using the CKD EPI equation. This calculation has not been validated in all clinical situations. eGFR's persistently <60 mL/min signify possible Chronic Kidney Disease.    Anion gap 9 5 - 15  CBC     Status: Abnormal   Collection Time: 07/03/16  5:26 AM  Result Value Ref Range   WBC 13.5 (H) 4.0 - 10.5 K/uL   RBC 4.18 3.87 - 5.11 MIL/uL   Hemoglobin 12.6 12.0 - 15.0 g/dL   HCT 38.0 36.0 - 46.0 %   MCV 90.9 78.0 - 100.0 fL   MCH 30.1 26.0 - 34.0 pg   MCHC 33.2 30.0 - 36.0 g/dL   RDW 13.8 11.5 - 15.5 %   Platelets 285 150 - 400 K/uL    Ct Abdomen Pelvis W Contrast  Result Date: 07/02/2016 CLINICAL DATA:  Non radiating waxing and waning abdominal pain EXAM: CT ABDOMEN AND PELVIS WITH CONTRAST TECHNIQUE: Multidetector CT imaging of the abdomen and pelvis was performed using the standard protocol following bolus administration of intravenous contrast. CONTRAST:  156m ISOVUE-300 IOPAMIDOL (ISOVUE-300) INJECTION 61% COMPARISON:  06/18/2015 FINDINGS: Lower chest: Mild atelectasis in the right middle lobe. No acute consolidation or pleural effusion. Heart size upper normal. Trace pericardial  effusion. Hepatobiliary: Calcified liver granuloma. No intra hepatic biliary dilatation. Multiple calcified stones in the gallbladder, no wall thickening. Small calcified stone in the cystic duct. Pancreas: Unremarkable. No pancreatic ductal dilatation or surrounding inflammatory changes. Spleen: Normal in size without focal abnormality. Adrenals/Urinary Tract: Adrenal glands are within normal limits. No focal renal abnormalities. Small cystocele. Bladder otherwise unremarkable. Stomach/Bowel: The stomach is nonenlarged. Small hiatal hernia. There are mildly dilated loops of fluid-filled small bowel within the central and anterior abdomen measuring up to 3.3 cm in diameter. Bowel loops appear adhesed to the anterior abdominal wall. At least 1 transition point visualized along the left upper anterior abdominal wall, series 2, image number 53, findings are likely secondary to adhesions. Contrast filled nondilated distal small bowel. No contrast in the colon. No colon wall thickening. Vascular/Lymphatic: Atherosclerotic vascular calcifications. No significantly enlarged lymph nodes. Reproductive: Status post hysterectomy. No adnexal masses. Other: Evidence of prior ventral hernia repair. No free air or free fluid. Musculoskeletal: Degenerative changes of the spine. No acute or suspicious bone lesions. IMPRESSION: 1. Multiple loops of  mildly dilated small bowel up to 3.3 cm. Dilated small bowel loops appear adhesed to the anterior abdominal wall with at least 1 transition point visualized within the left anterior abdomen. Findings are suspicious for a bowel obstruction, likely related to adhesions. 2. Gallstones. Small stone in the cystic duct. No evidence for biliary dilatation or gallbladder inflammation. Electronically Signed   By: Donavan Foil M.D.   On: 07/02/2016 23:22   Dg Abd Portable 1v-small Bowel Protocol-position Verification  Result Date: 07/03/2016 CLINICAL DATA:  Nasogastric tube placement EXAM:  PORTABLE ABDOMEN - 1 VIEW COMPARISON:  None. FINDINGS: The nasogastric tube extends well into the stomach with tip in the region of the mid gastric body. Moderately dilated small bowel loops are again evident. IMPRESSION: Nasogastric tube extends well into the stomach. Electronically Signed   By: Andreas Newport M.D.   On: 07/03/2016 03:35    ROS:  Pertinent items noted in HPI and remainder of comprehensive ROS otherwise negative.  Blood pressure (!) 132/46, pulse 83, temperature 98.5 F (36.9 C), temperature source Oral, resp. rate 18, height _0  (1.727 m), weight 78.5 kg (173 lb), SpO2 94 %. Physical Exam: Pleasant white female in no acute distress. Head is normocephalic, atraumatic. Neck is supple without lymphadenopathy. Lungs clear to auscultation with equal breath sounds bilaterally. Heart examination reveals a regular rate and rhythm without S3, S4, murmurs. Abdomen is soft but distended. No rigidity noted. Discomfort is noted on palpation. No hernias are noted. Occasional bowel sounds are appreciated. CT scan report and images reviewed  Assessment/Plan: Impression: Partial small bowel obstruction. Plan: Would continue NG tube decompression for now. No need for acute surgical intervention. If surgery is considered, the patient would like to be transferred to [facility] as she used to work for [employer].  Aviva Signs A 07/03/2016, 9:40 AM

## 2016-07-03 NOTE — Assessment & Plan Note (Signed)
PRN Zofran, compazine.

## 2016-07-03 NOTE — Assessment & Plan Note (Signed)
Plan: Hold oral diabetes medications.  Check fingerstick blood sugars q ac and hs.  Sliding scale insulin.    

## 2016-07-04 LAB — GLUCOSE, CAPILLARY
GLUCOSE-CAPILLARY: 124 mg/dL — AB (ref 65–99)
Glucose-Capillary: 175 mg/dL — ABNORMAL HIGH (ref 65–99)
Glucose-Capillary: 150 mg/dL — ABNORMAL HIGH (ref 65–99)
Glucose-Capillary: 123 mg/dL — ABNORMAL HIGH (ref 65–99)

## 2016-07-04 MED ORDER — SODIUM CHLORIDE 0.9 % IV SOLN
INTRAVENOUS | Status: AC
  Administered 2016-07-04 – 2016-07-05 (×2): via INTRAVENOUS

## 2016-07-04 MED ORDER — MORPHINE SULFATE (PF) 2 MG/ML IV SOLN: 2.0000 mg | INTRAVENOUS | Status: DC | PRN

## 2016-07-04 MED ORDER — ENALAPRILAT 1.25 MG/ML IV SOLN
1.2500 mg | Freq: Two times a day (BID) | INTRAVENOUS | Status: DC
  Filled 2016-07-04: qty 2

## 2016-07-04 NOTE — Progress Notes (Signed)
Subjective: Patient states she had episodes of emesis yesterday evening. There is approximately 900 mL of bilious fluid in the suction canister. She denies any flatus or bowel movement.  Objective: Vital signs in last 24 hours: Temp:  [98.6 F (37 C)-99 F (37.2 C)] 98.6 F (37 C) (01/04 0534) Pulse Rate:  [88-103] 102 (01/04 0534) Resp:  [18] 18 (01/04 0534) BP: (122-135)/(48-84) 132/84 (01/04 0534) SpO2:  [90 %-95 %] 94 % (01/04 0534) Last BM Date: 07/02/16  Intake/Output from previous day: 01/03 0701 - 01/04 0700 In: 0  Out: 700 [Urine:700] Intake/Output this shift: No intake/output data recorded.  General appearance: alert, cooperative, appears stated age and no distress GI: Soft, slightly less distended than yesterday. Nonspecific discomfort to palpation, though no rigidity noted. Occasional bowel sounds appreciated.  Lab Results:   Recent Labs  07/02/16 2157 07/03/16 0526  WBC 14.7* 13.5*  HGB 13.2 12.6  HCT 40.4 38.0  PLT 296 285   BMET  Recent Labs  07/02/16 2157 07/03/16 0526  NA 137 137  K 3.6 3.7  CL 98* 101  CO2 25 27  GLUCOSE 170* 162*  BUN 23* 21*  CREATININE 0.97 0.82  CALCIUM 9.8 9.0   PT/INR No results for input(s): LABPROT, INR in the last 72 hours.  Studies/Results: Ct Abdomen Pelvis W Contrast  Result Date: 07/02/2016 CLINICAL DATA:  Non radiating waxing and waning abdominal pain EXAM: CT ABDOMEN AND PELVIS WITH CONTRAST TECHNIQUE: Multidetector CT imaging of the abdomen and pelvis was performed using the standard protocol following bolus administration of intravenous contrast. CONTRAST:  151mL ISOVUE-300 IOPAMIDOL (ISOVUE-300) INJECTION 61% COMPARISON:  06/18/2015 FINDINGS: Lower chest: Mild atelectasis in the right middle lobe. No acute consolidation or pleural effusion. Heart size upper normal. Trace pericardial effusion. Hepatobiliary: Calcified liver granuloma. No intra hepatic biliary dilatation. Multiple calcified stones in the  gallbladder, no wall thickening. Small calcified stone in the cystic duct. Pancreas: Unremarkable. No pancreatic ductal dilatation or surrounding inflammatory changes. Spleen: Normal in size without focal abnormality. Adrenals/Urinary Tract: Adrenal glands are within normal limits. No focal renal abnormalities. Small cystocele. Bladder otherwise unremarkable. Stomach/Bowel: The stomach is nonenlarged. Small hiatal hernia. There are mildly dilated loops of fluid-filled small bowel within the central and anterior abdomen measuring up to 3.3 cm in diameter. Bowel loops appear adhesed to the anterior abdominal wall. At least 1 transition point visualized along the left upper anterior abdominal wall, series 2, image number 53, findings are likely secondary to adhesions. Contrast filled nondilated distal small bowel. No contrast in the colon. No colon wall thickening. Vascular/Lymphatic: Atherosclerotic vascular calcifications. No significantly enlarged lymph nodes. Reproductive: Status post hysterectomy. No adnexal masses. Other: Evidence of prior ventral hernia repair. No free air or free fluid. Musculoskeletal: Degenerative changes of the spine. No acute or suspicious bone lesions. IMPRESSION: 1. Multiple loops of mildly dilated small bowel up to 3.3 cm. Dilated small bowel loops appear adhesed to the anterior abdominal wall with at least 1 transition point visualized within the left anterior abdomen. Findings are suspicious for [name] bowel obstruction, likely related to adhesions. 2. Gallstones. Small stone in the cystic duct. No evidence for biliary dilatation or gallbladder inflammation. Electronically Signed   By: Donavan Foil M.D.   On: 07/02/2016 23:22   Dg Abd Portable 1v-small Bowel Obstruction Protocol-initial, 8 Hr Delay  Result Date: 07/03/2016 CLINICAL DATA:  83 y/o female with small bowel obstruction. 8 hour film status post Gastrografin administration via NG tube. Initial encounter. EXAM: PORTABLE  ABDOMEN  - 1 VIEW COMPARISON:  0318 hours today and earlier. FINDINGS: Portable AP supine view at 1232 hours. Enteric tube remains in place, side hole to level of the distal gastric body. Cholelithiasis re- demonstrated. Sequelae of ventral abdominal hernia re- demonstrated. Since 0318 hours today oral contrast now opacifies the majority of small bowel loops, but has not yet reached the cecum. Some residual excreted IV contrast suspected in the urinary bladder. Large bowel appears to remain decompressed. Stable visualized osseous structures. IMPRESSION: 1. Gastrografin administered 8 hours ago now all opacifies the majority of small bowel loops, but has not reached the cecum. Constellation compatible with partial small bowel obstruction. 2. Enteric tube side hole at the level of the distal stomach. 3. Cholelithiasis. Previous ventral abdominal hernia repair with mesh. Electronically Signed   By: [name] M.D.   On: 07/03/2016 13:10   Dg Abd Portable 1v-small Bowel Protocol-position Verification  Result Date: 07/03/2016 CLINICAL DATA:  Nasogastric tube placement EXAM: PORTABLE ABDOMEN - 1 VIEW COMPARISON:  None. FINDINGS: The nasogastric tube extends well into the stomach with tip in the region of the mid gastric body. Moderately dilated small bowel loops are again evident. IMPRESSION: Nasogastric tube extends well into the stomach. Electronically Signed   By: Andreas Newport M.D.   On: 07/03/2016 03:35    Anti-infectives: Anti-infectives    None      Assessment/Plan: Impression: Small bowel obstruction. No need for acute surgical intervention. Continue current management. Apparently, son has contacted Dr. Lucia Gaskins of [facility] about the patient's admission. Will reassess in [name].m.  LOS: 2 days    [name] 07/04/2016

## 2016-07-04 NOTE — Progress Notes (Signed)
PROGRESS NOTE    [name]  [id number]  DOB: 08-14-1931  DOA: 07/02/2016 PCP: Sallee Lange, MD  Hospital course: Patient is an 83 year old white female who presented emergency room with a 24-hour history of worsening nausea and vomiting. CT scan the abdomen revealed a partial small bowel obstruction. She states she had a similar episode over one year ago and it resolved without surgery. She does currently have some abdominal discomfort, though this was eased with morphine. An NG tube has been placed on admission.  Surgery has been consulted and following.   Assessment & Plan:   SBO (small bowel obstruction) With transition point. Patient has had a small bowel obstruction before, approximately one and half years ago, resolved without surgery. Small bowel obstruction protocol. NG tube placement tolerated and patient slowly showing some improvement.   Pt reported small amt of flatus this morning Patient states that she worked for [employer] for many years and that if she needs surgical evaluation she wants to be evaluated by the team in [place]. She mentioned Dr. Alphonsa Overall in particular. She is agreeable to trying initial conservative management here in [place] assistance from general surgery team.    Nausea and vomiting PRN Zofran, compazine.  Controlled type 2 diabetes mellitus without complication, without long-term current use of insulin ([place]) Hold oral diabetes medications.  Check fingerstick blood sugars q ac and hs.  Sliding scale insulin.   CBG (last 3)   Recent Labs  07/03/16 2251 07/04/16 0810 07/04/16 1139  GLUCAP 175* 150* 123*   Essential hypertension Will need to use IV anti-hypertensive medications instead of her oral meds. IV metoprolol, hydralazine, and enalaprilat.  Vitals:   07/04/16 1322 07/04/16 1328  BP: (!) 86/36 (!) 110/58  Pulse: 64   Resp: 18   Temp: 97.7 F (36.5 C)     DVT  prophylaxis: Lovenox.  Code Status:  Full   Family Communication: None Disposition Plan: Home Consults called: None Admission status: Inpatient  Subjective: Pt reported that she has a small amount of flatus this morning  Objective: Vitals:   07/04/16 0534 07/04/16 1207 07/04/16 1322 07/04/16 1328  BP: 132/84 (!) 120/35 (!) 86/36 (!) 110/58  Pulse: (!) 102 73 64   Resp: 18  18   Temp: 98.6 F (37 C)  97.7 F (36.5 C)   TempSrc: Oral  Oral   SpO2: 94%  97%   Weight:      Height:        Intake/Output Summary (Last 24 hours) at 07/04/16 1443 Last data filed at 07/04/16 0938  Gross per 24 hour  Intake                0 ml  Output             1250 ml  Net            -1250 ml   Filed Weights   07/02/16 2054  Weight: 78.5 kg (173 lb)    Exam:  General exam: awake, alert, NAD. Cooperative.  Respiratory system: Clear. No increased work of breathing. Cardiovascular system: S1 & S2 heard, No JVD, murmurs, gallops, clicks or pedal edema. Gastrointestinal system: Abdomen is nondistended, soft and nontender. Normal bowel sounds heard. Central nervous system: Alert and oriented. No focal neurological deficits. Extremities: no Cyanosis.  Data Reviewed: Basic Metabolic Panel:  Recent Labs Lab 07/02/16 2157 07/03/16 0526  NA 137 137  K 3.6 3.7  CL 98* 101  CO2 25 27  GLUCOSE 170* 162*  BUN 23* 21*  CREATININE 0.97 0.82  CALCIUM 9.8 9.0  MG 1.5*  --    Liver Function Tests:  Recent Labs Lab 07/02/16 2157 07/03/16 0526  AST 22 17  ALT 15 12*  ALKPHOS 91 80  BILITOT 0.8 0.8  PROT 7.2 6.6  ALBUMIN 4.1 3.8    Recent Labs Lab 07/02/16 2157  LIPASE 28   No results for input(s): AMMONIA in the last 168 hours. CBC:  Recent Labs Lab 07/02/16 2157 07/03/16 0526  WBC 14.7* 13.5*  HGB 13.2 12.6  HCT 40.4 38.0  MCV 90.0 90.9  PLT 296 285   Cardiac Enzymes: No results for input(s): CKTOTAL, CKMB, CKMBINDEX, TROPONINI in the last 168 hours. CBG (last 3)    Recent Labs  07/03/16 2251 07/04/16 0810 07/04/16 1139  GLUCAP 175* 150* 123*   Recent Results (from the past 240 hour(s))  MRSA PCR Screening     Status: None   Collection Time: 07/03/16  1:57 AM  Result Value Ref Range Status   MRSA by PCR NEGATIVE NEGATIVE Final    Comment:        The GeneXpert MRSA Assay (FDA approved for NASAL specimens only), is one component of a comprehensive MRSA colonization surveillance program. It is not intended to diagnose MRSA infection nor to guide or monitor treatment for MRSA infections.     Studies: Ct Abdomen Pelvis W Contrast  Result Date: 07/02/2016 CLINICAL DATA:  Non radiating waxing and waning abdominal pain EXAM: CT ABDOMEN AND PELVIS WITH CONTRAST TECHNIQUE: Multidetector CT imaging of the abdomen and pelvis was performed using the standard protocol following bolus administration of intravenous contrast. CONTRAST:  175mL ISOVUE-300 IOPAMIDOL (ISOVUE-300) INJECTION 61% COMPARISON:  06/18/2015 FINDINGS: Lower chest: Mild atelectasis in the right middle lobe. No acute consolidation or pleural effusion. Heart size upper normal. Trace pericardial effusion. Hepatobiliary: Calcified liver granuloma. No intra hepatic biliary dilatation. Multiple calcified stones in the gallbladder, no wall thickening. Small calcified stone in the cystic duct. Pancreas: Unremarkable. No pancreatic ductal dilatation or surrounding inflammatory changes. Spleen: Normal in size without focal abnormality. Adrenals/Urinary Tract: Adrenal glands are within normal limits. No focal renal abnormalities. Small cystocele. Bladder otherwise unremarkable. Stomach/Bowel: The stomach is nonenlarged. Small hiatal hernia. There are mildly dilated loops of fluid-filled small bowel within the central and anterior abdomen measuring up to 3.3 cm in diameter. Bowel loops appear adhesed to the anterior abdominal wall. At least 1 transition point visualized along the left upper anterior  abdominal wall, series 2, image number 53, findings are likely secondary to adhesions. Contrast filled nondilated distal small bowel. No contrast in the colon. No colon wall thickening. Vascular/Lymphatic: Atherosclerotic vascular calcifications. No significantly enlarged lymph nodes. Reproductive: Status post hysterectomy. No adnexal masses. Other: Evidence of prior ventral hernia repair. No free air or free fluid. Musculoskeletal: Degenerative changes of the spine. No acute or suspicious bone lesions. IMPRESSION: 1. Multiple loops of mildly dilated small bowel up to 3.3 cm. Dilated small bowel loops appear adhesed to the anterior abdominal wall with at least 1 transition point visualized within the left anterior abdomen. Findings are suspicious for a bowel obstruction, likely related to adhesions. 2. Gallstones. Small stone in the cystic duct. No evidence for biliary dilatation or gallbladder inflammation. Electronically Signed   By: Donavan Foil M.D.   On: 07/02/2016 23:22   Dg Abd Portable 1v-small Bowel Obstruction Protocol-initial, 8 Hr Delay  Result Date: 07/03/2016 CLINICAL DATA:  83 y/o female with small bowel obstruction. 8 hour film status post Gastrografin administration via NG tube. Initial encounter. EXAM: PORTABLE ABDOMEN - 1 VIEW COMPARISON:  0318 hours today and earlier. FINDINGS: Portable AP supine view at 1232 hours. Enteric tube remains in place, side hole to level of the distal gastric body. Cholelithiasis re- demonstrated. Sequelae of ventral abdominal hernia re- demonstrated. Since 0318 hours today oral contrast now opacifies the majority of small bowel loops, but has not yet reached the cecum. Some residual excreted IV contrast suspected in the urinary bladder. Large bowel appears to remain decompressed. Stable visualized osseous structures. IMPRESSION: 1. Gastrografin administered 8 hours ago now all opacifies the majority of small bowel loops, but has not reached the cecum.  Constellation compatible with partial small bowel obstruction. 2. Enteric tube side hole at the level of the distal stomach. 3. Cholelithiasis. Previous ventral abdominal hernia repair with mesh. Electronically Signed   By: [name] M.D.   On: 07/03/2016 13:10   Dg Abd Portable 1v-small Bowel Protocol-position Verification  Result Date: 07/03/2016 CLINICAL DATA:  Nasogastric tube placement EXAM: PORTABLE ABDOMEN - 1 VIEW COMPARISON:  None. FINDINGS: The nasogastric tube extends well into the stomach with tip in the region of the mid gastric body. Moderately dilated small bowel loops are again evident. IMPRESSION: Nasogastric tube extends well into the stomach. Electronically Signed   By: Andreas Newport M.D.   On: 07/03/2016 03:35   Scheduled Meds: . sodium chloride   Intravenous STAT  . enalaprilat  1.25 mg Intravenous Q6H  . enoxaparin (LOVENOX) injection  40 mg Subcutaneous Q24H  . insulin aspart  0-9 Units Subcutaneous TID WC  . metoprolol  5 mg Intravenous Q6H  . pantoprazole (PROTONIX) IV  40 mg Intravenous Q12H  . polyvinyl alcohol  1 drop Both Eyes BID   Continuous Infusions:  Principal Problem:   SBO (small bowel obstruction) Active Problems:   Controlled type 2 diabetes mellitus without complication, without long-term current use of insulin (HCC)   Nausea and vomiting   Essential hypertension  Time spent:   Irwin Brakeman, MD, FAAFP [facility] Pager [phone]  If 7PM-7AM, please contact night-coverage [email or web address] Password [email or web address] 07/04/2016, 2:43 PM    LOS: 2 days

## 2016-07-05 LAB — COMPREHENSIVE METABOLIC PANEL
ALT: 9 U/L — ABNORMAL LOW (ref 14–54)
AST: 11 U/L — AB (ref 15–41)
Albumin: 2.9 g/dL — ABNORMAL LOW (ref 3.5–5.0)
Alkaline Phosphatase: 60 U/L (ref 38–126)
Anion gap: 6 (ref 5–15)
BUN: 31 mg/dL — AB (ref 6–20)
CHLORIDE: 103 mmol/L (ref 101–111)
CO2: 32 mmol/L (ref 22–32)
Calcium: 8 mg/dL — ABNORMAL LOW (ref 8.9–10.3)
Creatinine, Ser: 1.04 mg/dL — ABNORMAL HIGH (ref 0.44–1.00)
GFR calc Af Amer: 56 mL/min — ABNORMAL LOW (ref >60)
GFR calc non Af Amer: 49 mL/min — ABNORMAL LOW (ref >60)
GLUCOSE: 125 mg/dL — AB (ref 65–99)
Potassium: 3.5 mmol/L (ref 3.5–5.1)
SODIUM: 141 mmol/L (ref 135–145)
Total Bilirubin: 1.4 mg/dL — ABNORMAL HIGH (ref 0.3–1.2)
Total Protein: 5.5 g/dL — ABNORMAL LOW (ref 6.5–8.1)

## 2016-07-05 LAB — GLUCOSE, CAPILLARY
GLUCOSE-CAPILLARY: 115 mg/dL — AB (ref 65–99)
Glucose-Capillary: 121 mg/dL — ABNORMAL HIGH (ref 65–99)
Glucose-Capillary: 122 mg/dL — ABNORMAL HIGH (ref 65–99)
Glucose-Capillary: 126 mg/dL — ABNORMAL HIGH (ref 65–99)
Glucose-Capillary: 101 mg/dL — ABNORMAL HIGH (ref 65–99)

## 2016-07-05 LAB — CBC
HCT: 35.3 % — ABNORMAL LOW (ref 36.0–46.0)
HEMOGLOBIN: 11.3 g/dL — AB (ref 12.0–15.0)
MCH: 29.9 pg (ref 26.0–34.0)
MCHC: 32 g/dL (ref 30.0–36.0)
MCV: 93.4 fL (ref 78.0–100.0)
PLATELETS: 241 10*3/uL (ref 150–400)
RBC: 3.78 MIL/uL — ABNORMAL LOW (ref 3.87–5.11)
RDW: 13.9 % (ref 11.5–15.5)
WBC: 7.1 10*3/uL (ref 4.0–10.5)

## 2016-07-05 MED ORDER — SODIUM CHLORIDE 0.9 % IV SOLN
INTRAVENOUS | Status: AC
  Administered 2016-07-05: 18:00:00 via INTRAVENOUS

## 2016-07-05 MED ORDER — ASPIRIN 81 MG PO CHEW
81.0000 mg | CHEWABLE_TABLET | Freq: Every day | ORAL | Status: DC
  Administered 2016-07-06 – 2016-07-08 (×3): 81 mg via ORAL
  Filled 2016-07-05 (×5): qty 1

## 2016-07-05 MED ORDER — INDAPAMIDE 2.5 MG PO TABS
2.5000 mg | ORAL_TABLET | Freq: Every day | ORAL | Status: DC
Start: 2016-07-06 — End: 2016-07-08
  Administered 2016-07-07 – 2016-07-08 (×2): 2.5 mg via ORAL
  Filled 2016-07-05 (×5): qty 1

## 2016-07-05 MED ORDER — AMLODIPINE BESYLATE 5 MG PO TABS
10.0000 mg | ORAL_TABLET | Freq: Every day | ORAL | Status: DC
  Administered 2016-07-07 – 2016-07-08 (×2): 10 mg via ORAL
  Filled 2016-07-05 (×3): qty 2

## 2016-07-05 MED ORDER — PANTOPRAZOLE SODIUM 40 MG PO TBEC
40.0000 mg | DELAYED_RELEASE_TABLET | Freq: Every day | ORAL | Status: DC
  Administered 2016-07-06 – 2016-07-08 (×3): 40 mg via ORAL
  Filled 2016-07-05 (×3): qty 1

## 2016-07-05 NOTE — Progress Notes (Signed)
Pt NG tube removed per order and policy.  Pt tolerated well.  Will continue to monitor.

## 2016-07-05 NOTE — Progress Notes (Signed)
  Subjective: Has had multiple bowel movements and passing flatus over the past 24 hours. States her abdominal pain has eased.  Objective: Vital signs in last 24 hours: Temp:  [97.7 F (36.5 C)-98.4 F (36.9 C)] 98.3 F (36.8 C) (01/05 0634) Pulse Rate:  [59-73] 59 (01/05 0913) Resp:  [16-18] 16 (01/05 0602) BP: (86-126)/(35-58) 121/41 (01/05 0913) SpO2:  [85 %-100 %] 100 % (01/05 0913) FiO2 (%):  [3 %] 3 % (01/04 2322) Last BM Date: 07/04/16  Intake/Output from previous day: 01/04 0701 - 01/05 0700 In: 1293.8 [I.V.:1293.8] Out: 1400 [Urine:400; Emesis/NG output:1000] Intake/Output this shift: Total I/O In: -  Out: 100 [Emesis/NG output:100]  General appearance: alert, cooperative and no distress GI: Soft, bowel sounds active. Minimal discomfort to palpation noted. No rigidity noted.  Lab Results:   Recent Labs  07/03/16 0526 07/05/16 0614  WBC 13.5* 7.1  HGB 12.6 11.3*  HCT 38.0 35.3*  PLT 285 241   BMET  Recent Labs  07/03/16 0526 07/05/16 0614  NA 137 141  K 3.7 3.5  CL 101 103  CO2 27 32  GLUCOSE 162* 125*  BUN 21* 31*  CREATININE 0.82 1.04*  CALCIUM 9.0 8.0*   PT/INR No results for input(s): LABPROT, INR in the last 72 hours.  Studies/Results: Dg Abd Portable 1v-small Bowel Obstruction Protocol-initial, 8 Hr Delay  Result Date: 07/03/2016 CLINICAL DATA:  83 y/o female with small bowel obstruction. 8 hour film status post Gastrografin administration via NG tube. Initial encounter. EXAM: PORTABLE ABDOMEN - 1 VIEW COMPARISON:  0318 hours today and earlier. FINDINGS: Portable AP supine view at 1232 hours. Enteric tube remains in place, side hole to level of the distal gastric body. Cholelithiasis re- demonstrated. Sequelae of ventral abdominal hernia re- demonstrated. Since 0318 hours today oral contrast now opacifies the majority of small bowel loops, but has not yet reached the cecum. Some residual excreted IV contrast suspected in the urinary bladder.  Large bowel appears to remain decompressed. Stable visualized osseous structures. IMPRESSION: 1. Gastrografin administered 8 hours ago now all opacifies the majority of small bowel loops, but has not reached the cecum. Constellation compatible with partial small bowel obstruction. 2. Enteric tube side hole at the level of the distal stomach. 3. Cholelithiasis. Previous ventral abdominal hernia repair with mesh. Electronically Signed   By: [name] M.D.   On: 07/03/2016 13:10    Anti-infectives: Anti-infectives    None      Assessment/Plan: Impression: Small bowel obstruction, resolving. NG tube output decreased. Bowel function returning. Plan: Will remove NG tube and start clear liquid diet. May advance diet as tolerated. No need for acute surgical intervention.  LOS: 3 days    [name] A 07/05/2016

## 2016-07-05 NOTE — Care Management Important Message (Signed)
Important Message  Patient Details  Name: KERILYN BETTI MRN: [id number] Date of Birth: 1931/10/15   Medicare Important Message Given:  Yes    Sherald Barge, RN 07/05/2016, 9:41 AM

## 2016-07-05 NOTE — Progress Notes (Signed)
PROGRESS NOTE    [name]  [id number]  DOB: May 11, 1932  DOA: 07/02/2016 PCP: Sallee Lange, MD  Hospital course: Patient is an 83 year old white female who presented emergency room with a 24-hour history of worsening nausea and vomiting. CT scan the abdomen revealed a partial small bowel obstruction. She states she had a similar episode over one year ago and it resolved without surgery. She does currently have some abdominal discomfort, though this was eased with morphine. An NG tube has been placed on admission.  Surgery has been consulted and following.   Assessment & Plan:   SBO (small bowel obstruction) With transition point. Patient has had a small bowel obstruction before, approximately one and half years ago, resolved without surgery. Small bowel obstruction protocol. Pt having BMs and flatus, started on clears by surgery.  NG removed 1/5 by surgery.  Patient states that she worked for [employer] for many years and that if she needs surgical evaluation she wants to be evaluated by the team in [place]. She mentioned Dr. Alphonsa Overall in particular. She is agreeable to trying initial conservative management here in [place] assistance from general surgery team.    Nausea and vomiting PRN Zofran, compazine.  Controlled type 2 diabetes mellitus without complication, without long-term current use of insulin ([place]) Hold oral diabetes medications.  Check fingerstick blood sugars q ac and hs.  Sliding scale insulin.   CBG (last 3)   Recent Labs  07/05/16 0736 07/05/16 1113 07/05/16 1612  GLUCAP 122* 126* 101*   Essential hypertension Resume oral home blood pressure medications  Vitals:   07/05/16 0913 07/05/16 1635  BP: (!) 121/41 (!) 121/40  Pulse: (!) 59 (!) 56  Resp:  18  Temp:  97.9 F (36.6 C)    DVT prophylaxis: Lovenox.  Code Status:  Full   Family Communication: None Disposition Plan: Home Consults  called: None Admission status: Inpatient  Subjective: Pt having flatus and BMs. Now on clears per surgery.   Objective: Vitals:   07/05/16 0634 07/05/16 0913 07/05/16 1050 07/05/16 1635  BP:  (!) 121/41  (!) 121/40  Pulse:  (!) 59  (!) 56  Resp:    18  Temp: 98.3 F (36.8 C)   97.9 F (36.6 C)  TempSrc: Oral   Oral  SpO2: 97% 100% 99% 98%  Weight:      Height:        Intake/Output Summary (Last 24 hours) at 07/05/16 1636 Last data filed at 07/05/16 1500  Gross per 24 hour  Intake           1702.5 ml  Output              900 ml  Net            802.5 ml   Filed Weights   07/02/16 2054  Weight: 78.5 kg (173 lb)    Exam:  General exam: awake, alert, NAD. Cooperative.  Respiratory system: Clear. No increased work of breathing. Cardiovascular system: S1 & S2 heard, No JVD, murmurs, gallops, clicks or pedal edema. Gastrointestinal system: Abdomen is nondistended, soft and nontender. Normal bowel sounds heard. Central nervous system: Alert and oriented. No focal neurological deficits. Extremities: no Cyanosis.  Data Reviewed: Basic Metabolic Panel:  Recent Labs Lab 07/02/16 2157 07/03/16 0526 07/05/16 0614  NA 137 137 141  K 3.6 3.7 3.5  CL 98* 101 103  CO2 25 27 32  GLUCOSE 170* 162* 125*  BUN  23* 21* 31*  CREATININE 0.97 0.82 1.04*  CALCIUM 9.8 9.0 8.0*  MG 1.5*  --   --    Liver Function Tests:  Recent Labs Lab 07/02/16 2157 07/03/16 0526 07/05/16 0614  AST 22 17 11*  ALT 15 12* 9*  ALKPHOS 91 80 60  BILITOT 0.8 0.8 1.4*  PROT 7.2 6.6 5.5*  ALBUMIN 4.1 3.8 2.9*    Recent Labs Lab 07/02/16 2157  LIPASE 28   No results for input(s): AMMONIA in the last 168 hours. CBC:  Recent Labs Lab 07/02/16 2157 07/03/16 0526 07/05/16 0614  WBC 14.7* 13.5* 7.1  HGB 13.2 12.6 11.3*  HCT 40.4 38.0 35.3*  MCV 90.0 90.9 93.4  PLT 296 285 241   Cardiac Enzymes: No results for input(s): CKTOTAL, CKMB, CKMBINDEX, TROPONINI in the last 168 hours. CBG  (last 3)   Recent Labs  07/05/16 0736 07/05/16 1113 07/05/16 1612  GLUCAP 122* 126* 101*   Recent Results (from the past 240 hour(s))  MRSA PCR Screening     Status: None   Collection Time: 07/03/16  1:57 AM  Result Value Ref Range Status   MRSA by PCR NEGATIVE NEGATIVE Final    Comment:        The GeneXpert MRSA Assay (FDA approved for NASAL specimens only), is one component of a comprehensive MRSA colonization surveillance program. It is not intended to diagnose MRSA infection nor to guide or monitor treatment for MRSA infections.     Studies: No results found. Scheduled Meds: . sodium chloride   Intravenous STAT  . [START ON 07/06/2016] amLODipine  10 mg Oral Daily  . [START ON 07/06/2016] aspirin  81 mg Oral Daily  . enoxaparin (LOVENOX) injection  40 mg Subcutaneous Q24H  . [START ON 07/06/2016] indapamide  2.5 mg Oral Daily  . insulin aspart  0-9 Units Subcutaneous TID WC  . [START ON 07/06/2016] pantoprazole  40 mg Oral Q0600  . polyvinyl alcohol  1 drop Both Eyes BID   Continuous Infusions:  Principal Problem:   SBO (small bowel obstruction) Active Problems:   Controlled type 2 diabetes mellitus without complication, without long-term current use of insulin ([facility])   Nausea and vomiting   Essential hypertension  Time spent:   Irwin Brakeman, MD, FAAFP [facility] Pager [phone]  If 7PM-7AM, please contact night-coverage [email or web address] Password [email or web address] 07/05/2016, 4:36 PM    LOS: 3 days

## 2016-07-05 NOTE — Progress Notes (Signed)
Pt ambulated in hall with NT and RN at standby, walker used to assist.  Ambulated approximately 150 ft.  Tolerated well.

## 2016-07-05 NOTE — Care Management Note (Signed)
Case Management Note  Patient Details  Name: [name] MRN: [id number] Date of Birth: [birth year]  Subjective/Objective:                  Pt admitted with SBO. She is from home, lives alone and is ind with ADL's. She has son is very busy and not able to provide a lot of assistance. Pt has PCP, transportation and no difficulty obtaining her medications. She has a long term care policy and is interested in SNF placement while recovering. Pt reports being weak but is up to chair with no assistance and it is not anticipated that pt will qualify for SNF. CSW discussed SNF and long term care policy with pt and Tammy at the [facility]. Pt understands she does not have a skilled need and will not be eligible to go to SNF after DC. Pt understands and is agreeable to home with [facility] services. She has used [facility] int he past and would like to use them again. She is aware that [facility] has 48hrs to make first visit. Romualdo Bolk, of [facility], aware of [facility] referral and will obtain pt info from chart.   Action/Plan: Anticipate DC home over weekend. [place] RN will notify [facility] of pt leaving hospital.   Expected Discharge Date:      07/07/2015            Expected Discharge Plan:  [place]  In-House Referral:  Clinical Social Work  Discharge planning Services  CM Consult  Post Acute Care Choice:  Home Health Choice offered to:  Patient  HH Arranged:  RN, PT, Nurse's Aide [place] Agency:  [place]  Status of Service:  Completed, signed off  Sherald Barge, RN 07/05/2016, 2:13 PM

## 2016-07-06 LAB — BASIC METABOLIC PANEL
Anion gap: 5 (ref 5–15)
BUN: 20 mg/dL (ref 6–20)
CHLORIDE: 103 mmol/L (ref 101–111)
CO2: 31 mmol/L (ref 22–32)
Calcium: 8.1 mg/dL — ABNORMAL LOW (ref 8.9–10.3)
Creatinine, Ser: 0.78 mg/dL (ref 0.44–1.00)
GFR calc non Af Amer: >60 mL/min (ref >60)
Glucose, Bld: 135 mg/dL — ABNORMAL HIGH (ref 65–99)
POTASSIUM: 3.3 mmol/L — AB (ref 3.5–5.1)
Sodium: 139 mmol/L (ref 135–145)

## 2016-07-06 LAB — GLUCOSE, CAPILLARY
GLUCOSE-CAPILLARY: 128 mg/dL — AB (ref 65–99)
GLUCOSE-CAPILLARY: 111 mg/dL — AB (ref 65–99)
GLUCOSE-CAPILLARY: 120 mg/dL — AB (ref 65–99)
GLUCOSE-CAPILLARY: 129 mg/dL — AB (ref 65–99)

## 2016-07-06 MED ORDER — POTASSIUM CHLORIDE CRYS ER 20 MEQ PO TBCR
20.0000 meq | EXTENDED_RELEASE_TABLET | Freq: Once | ORAL | Status: AC
  Administered 2016-07-06: 20 meq via ORAL
  Filled 2016-07-06: qty 1

## 2016-07-06 NOTE — Progress Notes (Signed)
PROGRESS NOTE    [name]  [id number]  DOB: [birth year]  DOA: 07/02/2016 PCP: Sallee Lange, MD  Hospital course: Patient is an 83 year old white female who presented emergency room with a 24-hour history of worsening nausea and vomiting. CT scan the abdomen revealed a partial small bowel obstruction. She states she had a similar episode over one year ago and it resolved without surgery. She does currently have some abdominal discomfort, though this was eased with morphine. An NG tube has been placed on admission.  Surgery has been consulted and following.   Assessment & Plan:   SBO (small bowel obstruction) With transition point. Patient has had a small bowel obstruction before, approximately one and half years ago, resolved without surgery. Small bowel obstruction protocol. Pt having BMs and flatus, started on clears by surgery.  NG removed 1/5 by surgery.  Patient states that she worked for [employer] for many years and that if she needs surgical evaluation she wants to be evaluated by the team in [place]. She mentioned Dr. Alphonsa Overall in particular. She is agreeable to trying initial conservative management here in [place] assistance from general surgery team.  Pt is tolerating clears and diet is being advanced by surgery 1/6.     Nausea and vomiting PRN Zofran, compazine.  Controlled type 2 diabetes mellitus without complication, without long-term current use of insulin ([place]) Hold oral diabetes medications.  Check fingerstick blood sugars q ac and hs.  Sliding scale insulin.   CBG (last 3)   Recent Labs  07/05/16 2106 07/06/16 0758 07/06/16 1116  GLUCAP 115* 128* 111*   Essential hypertension Resume oral home blood pressure medications  Vitals:   07/06/16 0610 07/06/16 0941  BP: (!) 139/56 (!) 121/43  Pulse: 66 60  Resp: 18 18  Temp: 99.4 F (37.4 C)     DVT prophylaxis: Lovenox.  Code Status:  Full     Family Communication: None Disposition Plan: Home soon with [place] called: None Admission status: Inpatient  Subjective: Pt tolerated clears well.  Sitting up in chair.     Objective: Vitals:   07/05/16 1955 07/05/16 2108 07/06/16 0610 07/06/16 0941  BP:  (!) 118/43 (!) 139/56 (!) 121/43  Pulse:  (!) 57 66 60  Resp:  18 18 18   Temp:  98.8 F (37.1 C) 99.4 F (37.4 C)   TempSrc:  Oral Oral   SpO2: (!) 89% 100% 100% 100%  Weight:   78.4 kg (172 lb 11.7 oz)   Height:        Intake/Output Summary (Last 24 hours) at 07/06/16 1315 Last data filed at 07/06/16 0611  Gross per 24 hour  Intake           1422.5 ml  Output              650 ml  Net            772.5 ml   Filed Weights   07/02/16 2054 07/06/16 0610  Weight: 78.5 kg (173 lb) 78.4 kg (172 lb 11.7 oz)    Exam:  General exam: awake, alert, NAD. Cooperative.  Respiratory system: Clear. No increased work of breathing. Cardiovascular system: S1 & S2 heard, No JVD, murmurs, gallops, clicks or pedal edema. Gastrointestinal system: Abdomen is nondistended, soft and nontender. Normal bowel sounds heard. Central nervous system: Alert and oriented. No focal neurological deficits. Extremities: no Cyanosis.  Data Reviewed: Basic Metabolic Panel:  Recent Labs Lab 07/02/16  2157 07/03/16 0526 07/05/16 0614 07/06/16 0717  NA 137 137 141 139  K 3.6 3.7 3.5 3.3*  CL 98* 101 103 103  CO2 25 27 32 31  GLUCOSE 170* 162* 125* 135*  BUN 23* 21* 31* 20  CREATININE 0.97 0.82 1.04* 0.78  CALCIUM 9.8 9.0 8.0* 8.1*  MG 1.5*  --   --   --    Liver Function Tests:  Recent Labs Lab 07/02/16 2157 07/03/16 0526 07/05/16 0614  AST 22 17 11*  ALT 15 12* 9*  ALKPHOS 91 80 60  BILITOT 0.8 0.8 1.4*  PROT 7.2 6.6 5.5*  ALBUMIN 4.1 3.8 2.9*    Recent Labs Lab 07/02/16 2157  LIPASE 28   No results for input(s): AMMONIA in the last 168 hours. CBC:  Recent Labs Lab 07/02/16 2157 07/03/16 0526  07/05/16 0614  WBC 14.7* 13.5* 7.1  HGB 13.2 12.6 11.3*  HCT 40.4 38.0 35.3*  MCV 90.0 90.9 93.4  PLT 296 285 241   Cardiac Enzymes: No results for input(s): CKTOTAL, CKMB, CKMBINDEX, TROPONINI in the last 168 hours. CBG (last 3)   Recent Labs  07/05/16 2106 07/06/16 0758 07/06/16 1116  GLUCAP 115* 128* 111*   Recent Results (from the past 240 hour(s))  MRSA PCR Screening     Status: None   Collection Time: 07/03/16  1:57 AM  Result Value Ref Range Status   MRSA by PCR NEGATIVE NEGATIVE Final    Comment:        The GeneXpert MRSA Assay (FDA approved for NASAL specimens only), is one component of a comprehensive MRSA colonization surveillance program. It is not intended to diagnose MRSA infection nor to guide or monitor treatment for MRSA infections.     Studies: No results found. Scheduled Meds: . amLODipine  10 mg Oral Daily  . aspirin  81 mg Oral Daily  . enoxaparin (LOVENOX) injection  40 mg Subcutaneous Q24H  . indapamide  2.5 mg Oral Daily  . insulin aspart  0-9 Units Subcutaneous TID WC  . pantoprazole  40 mg Oral Q0600  . polyvinyl alcohol  1 drop Both Eyes BID   Continuous Infusions:  Principal Problem:   SBO (small bowel obstruction) Active Problems:   Controlled type 2 diabetes mellitus without complication, without long-term current use of insulin (HCC)   Nausea and vomiting   Essential hypertension  Time spent:   Irwin Brakeman, MD, FAAFP [facility] Pager [phone]  If 7PM-7AM, please contact night-coverage [email or web address] Password [email or web address] 07/06/2016, 1:15 PM    LOS: 4 days

## 2016-07-06 NOTE — Progress Notes (Signed)
  Subjective: Patient tolerating clear liquid diet well. Having multiple bowel movements.  Objective: Vital signs in last 24 hours: Temp:  [97.9 F (36.6 C)-99.4 F (37.4 C)] 99.4 F (37.4 C) (01/06 0610) Pulse Rate:  [56-66] 66 (01/06 0610) Resp:  [18] 18 (01/06 0610) BP: (118-139)/(40-56) 139/56 (01/06 0610) SpO2:  [89 %-100 %] 100 % (01/06 0610) Weight:  [78.4 kg (172 lb 11.7 oz)] 78.4 kg (172 lb 11.7 oz) (01/06 0610) Last BM Date: 07/06/16  Intake/Output from previous day: 01/05 0701 - 01/06 0700 In: 1542.5 [P.O.:240; I.V.:1302.5] Out: 1000 [Urine:900; Emesis/NG output:100] Intake/Output this shift: No intake/output data recorded.  General appearance: alert, cooperative and no distress GI: soft, non-tender; bowel sounds normal; no masses,  no organomegaly  Lab Results:   Recent Labs  07/05/16 0614  WBC 7.1  HGB 11.3*  HCT 35.3*  PLT 241   BMET  Recent Labs  07/05/16 0614 07/06/16 0717  NA 141 139  K 3.5 3.3*  CL 103 103  CO2 32 31  GLUCOSE 125* 135*  BUN 31* 20  CREATININE 1.04* 0.78  CALCIUM 8.0* 8.1*   PT/INR No results for input(s): LABPROT, INR in the last 72 hours.  Studies/Results: No results found.  Anti-infectives: Anti-infectives    None      Assessment/Plan: Small bowel obstruction, resolving We'll advance to soft diet. May advance to carb modified diet as tolerated. Will follow peripherally with you.  LOS: 4 days    Cherisa Brucker A 07/06/2016

## 2016-07-07 LAB — GLUCOSE, CAPILLARY
GLUCOSE-CAPILLARY: 124 mg/dL — AB (ref 65–99)
GLUCOSE-CAPILLARY: 135 mg/dL — AB (ref 65–99)
GLUCOSE-CAPILLARY: 127 mg/dL — AB (ref 65–99)
Glucose-Capillary: 132 mg/dL — ABNORMAL HIGH (ref 65–99)

## 2016-07-07 NOTE — Progress Notes (Signed)
PROGRESS NOTE    [name]  [id number]  DOB: 04/30/1932  DOA: 07/02/2016 PCP: Sallee Lange, MD  Hospital course: Patient is an 83 year old white female who presented emergency room with a 24-hour history of worsening nausea and vomiting. CT scan the abdomen revealed a partial small bowel obstruction. She states she had a similar episode over one year ago and it resolved without surgery. She does currently have some abdominal discomfort, though this was eased with morphine. An NG tube has been placed on admission.  Surgery has been consulted and following.   Assessment & Plan:   SBO (small bowel obstruction) With transition point. Patient has had a small bowel obstruction before, approximately one and half years ago, resolved without surgery. Small bowel obstruction protocol. Pt having BMs and flatus, started on clears by surgery.  NG removed 1/5 by surgery.  Patient states that she worked for [employer] for many years and that if she needs surgical evaluation she wants to be evaluated by the team in [place]. She mentioned Dr. Alphonsa Overall in particular. She is agreeable to trying initial conservative management here in [place] assistance from general surgery team.  Advance diet, increase ambulation, hopefully to go home tomorrow 1/8.      Nausea and vomiting PRN Zofran, compazine.  Controlled type 2 diabetes mellitus without complication, without long-term current use of insulin ([place]) Hold oral diabetes medications.  Check fingerstick blood sugars q ac and hs.  Sliding scale insulin.   CBG (last 3)   Recent Labs  07/06/16 2158 07/07/16 0740 07/07/16 1129  GLUCAP 129* 124* 135*   Essential hypertension Resume oral home blood pressure medications  Vitals:   07/07/16 0624 07/07/16 0837  BP: (!) 127/54 (!) 139/58  Pulse: 64 71  Resp: 18   Temp: 97.7 F (36.5 C)     DVT prophylaxis: Lovenox.  Code Status:  Full     Disposition Plan: Home soon with [facility] services 1/8 Consults called: surgery Admission status: Inpatient  Subjective: Pt reports that she is feeling stronger, Trying to ambulate.     Objective: Vitals:   07/06/16 1501 07/06/16 2159 07/07/16 0624 07/07/16 0837  BP: (!) 112/45 (!) 132/41 (!) 127/54 (!) 139/58  Pulse: 64 65 64 71  Resp: 18 18 18    Temp: 98.2 F (36.8 C) 98 F (36.7 C) 97.7 F (36.5 C)   TempSrc: Oral Oral Oral   SpO2: 97% 96% 96% 98%  Weight:   78.4 kg (172 lb 12 oz)   Height:        Intake/Output Summary (Last 24 hours) at 07/07/16 1148 Last data filed at 07/07/16 0916  Gross per 24 hour  Intake              720 ml  Output             1100 ml  Net             -380 ml   Filed Weights   07/02/16 2054 07/06/16 0610 07/07/16 0624  Weight: 78.5 kg (173 lb) 78.4 kg (172 lb 11.7 oz) 78.4 kg (172 lb 12 oz)    Exam:  General exam: awake, alert, NAD. Cooperative.  Respiratory system: Clear. No increased work of breathing. Cardiovascular system: S1 & S2 heard, No JVD, murmurs, gallops, clicks or pedal edema. Gastrointestinal system: Abdomen is nondistended, soft and nontender. Normal bowel sounds heard. Central nervous system: Alert and oriented. No focal neurological deficits. Extremities: no Cyanosis.  Data Reviewed: Basic Metabolic Panel:  Recent Labs Lab 07/02/16 2157 07/03/16 0526 07/05/16 0614 07/06/16 0717  NA 137 137 141 139  K 3.6 3.7 3.5 3.3*  CL 98* 101 103 103  CO2 25 27 32 31  GLUCOSE 170* 162* 125* 135*  BUN 23* 21* 31* 20  CREATININE 0.97 0.82 1.04* 0.78  CALCIUM 9.8 9.0 8.0* 8.1*  MG 1.5*  --   --   --    Liver Function Tests:  Recent Labs Lab 07/02/16 2157 07/03/16 0526 07/05/16 0614  AST 22 17 11*  ALT 15 12* 9*  ALKPHOS 91 80 60  BILITOT 0.8 0.8 1.4*  PROT 7.2 6.6 5.5*  ALBUMIN 4.1 3.8 2.9*    Recent Labs Lab 07/02/16 2157  LIPASE 28   No results for input(s): AMMONIA in the last 168 hours. CBC:  Recent  Labs Lab 07/02/16 2157 07/03/16 0526 07/05/16 0614  WBC 14.7* 13.5* 7.1  HGB 13.2 12.6 11.3*  HCT 40.4 38.0 35.3*  MCV 90.0 90.9 93.4  PLT 296 285 241   Cardiac Enzymes: No results for input(s): CKTOTAL, CKMB, CKMBINDEX, TROPONINI in the last 168 hours. CBG (last 3)   Recent Labs  07/06/16 2158 07/07/16 0740 07/07/16 1129  GLUCAP 129* 124* 135*   Recent Results (from the past 240 hour(s))  MRSA PCR Screening     Status: None   Collection Time: 07/03/16  1:57 AM  Result Value Ref Range Status   MRSA by PCR NEGATIVE NEGATIVE Final    Comment:        The GeneXpert MRSA Assay (FDA approved for NASAL specimens only), is one component of a comprehensive MRSA colonization surveillance program. It is not intended to diagnose MRSA infection nor to guide or monitor treatment for MRSA infections.     Studies: No results found. Scheduled Meds: . amLODipine  10 mg Oral Daily  . aspirin  81 mg Oral Daily  . enoxaparin (LOVENOX) injection  40 mg Subcutaneous Q24H  . indapamide  2.5 mg Oral Daily  . insulin aspart  0-9 Units Subcutaneous TID WC  . pantoprazole  40 mg Oral Q0600  . polyvinyl alcohol  1 drop Both Eyes BID   Continuous Infusions:  Principal Problem:   SBO (small bowel obstruction) Active Problems:   Controlled type 2 diabetes mellitus without complication, without long-term current use of insulin (HCC)   Nausea and vomiting   Essential hypertension  Time spent:   Irwin Brakeman, MD, FAAFP [facility] Pager [phone]  If 7PM-7AM, please contact night-coverage [email or web address] Password [email or web address] 07/07/2016, 11:48 AM    LOS: 5 days

## 2016-07-08 LAB — GLUCOSE, CAPILLARY
GLUCOSE-CAPILLARY: 119 mg/dL — AB (ref 65–99)
Glucose-Capillary: 134 mg/dL — ABNORMAL HIGH (ref 65–99)

## 2016-07-08 NOTE — Care Management Note (Signed)
Case Management Note  Patient Details  Name: [name] MRN: [id number] Date of Birth: 09/06/1931 Expected Discharge Date:    07/08/2016              Expected Discharge Plan:  [place]  In-House Referral:  Clinical Social Work  Discharge planning Services  CM Consult  Post Acute Care Choice:  Home Health Choice offered to:  Patient  HH Arranged:  RN, PT, Nurse's Aide [place] Agency:  [place]  Status of Service:  Completed, signed off     Additional Comments: Pt discharging home today with [facility] services. Pt aware that [facility] has 48 hrs to make first visit. Romualdo Bolk, of [facility], aware of DC home today.   Sherald Barge, RN 07/08/2016, 12:06 PM

## 2016-07-08 NOTE — Care Management Important Message (Signed)
Important Message  Patient Details  Name: [name] MRN: [id number] Date of Birth: 06-02-1932   Medicare Important Message Given:  Yes    Sherald Barge, RN 07/08/2016, 12:08 PM

## 2016-07-08 NOTE — Discharge Instructions (Signed)
Take over the counter stool softener daily  Small Bowel Obstruction A small bowel obstruction means that something is blocking the small bowel. The small bowel is also called the small intestine. It is the long tube that connects the stomach to the colon. An obstruction will stop food and fluids from passing through the small bowel. Treatment depends on what is causing the problem and how bad the problem is. Follow these instructions at home:  Get a lot of rest.  Follow your diet as told by your doctor. You may need to:  Only drink clear liquids until you start to get better.  Avoid solid foods as told by your doctor.  Take over-the-counter and prescription medicines only as told by your doctor.  Keep all follow-up visits as told by your doctor. This is important. Contact a doctor if:  You have a fever.  You have chills. Get help right away if:  You have pain or cramps that get worse.  You throw up (vomit) blood.  You have a feeling of being sick to your stomach (nausea) that does not go away.  You cannot stop throwing up.  You cannot drink fluids.  You feel confused.  You feel dry or thirsty (dehydrated).  Your belly gets more bloated.  You feel weak or you pass out (faint). This information is not intended to replace advice given to you by your health care provider. Make sure you discuss any questions you have with your health care provider. Document Released: 07/25/2004 Document Revised: 02/12/2016 Document Reviewed: 08/11/2014 Elsevier Interactive Patient Education  2017 [employer].

## 2016-07-08 NOTE — Discharge Summary (Signed)
Physician Discharge Summary  [name] [id number] DOB: 1931/10/09 DOA: 07/02/2016  PCP: Sallee Lange, MD  Admit date: 07/02/2016 Discharge date: 07/08/2016  Admitted From: Home  Disposition:  Home   Recommendations for Outpatient Follow-up:  1. Follow up with PCP in 1 weeks 2. Please obtain BMP/CBC in one week  Home Health: YES   Discharge Condition: STABLE  CODE STATUS: FULL   Brief/Interim Summary: HPI:  The patient is an 83 yo woman with hypertension, history of appendectomy, cholecystectomy, hemicolectomy due to diverticulitis, and hysterectomy, with history of small bowel obstruction one and half years ago, who presents with abdominal pain. Onset: This evening after dinner. Duration: intermittent. Timing: Started after eating evening meal. Severity: Severe. Context: After eating.  Location: Lower abdomen and periumbilical area.  Radiation: none Character/Quality: 10/10, "gassy pain." Alleviated by: Nothing. Exacerbated by: Nothing. Associated Symptoms: Vomiting x 2 green fluid. Nausea. No diarrhea, constipation, or bloody stool. Last bowel movement early this morning and was normal.  Treatments: none at home except usual medications.  ED Course: The patient was evaluated with CT scan and found to have a small bowel obstruction with transition point. She is given IV fluids and IV morphine.  SBO (small bowel obstruction) With transition point. Patient has had a small bowel obstruction before, approximately one and half years ago, resolved without surgery. Small bowel obstruction protocol. Pt having BMs and flatus, started on clears by surgery.  NG removed 1/5 by surgery.  Patient states that she worked for [employer] for many years and that if she needs surgical evaluation she wants to be evaluated by the team in [place]. She mentioned Dr. Alphonsa Overall in particular. She is agreeable to trying initialconservative management herein [place] assistance from general surgery team.  Advance diet, increase ambulation, hopefully to go home tomorrow 1/8.   Pt did well, eating and drinking well, normal BMs, no nausea or vomiting or diarrhea.  Pt will have home health services. Pt to follow up with PCP in 1 week.  Pt to take daily stool softeners.     Nausea and vomiting PRN Zofran, compazine.  Controlled type 2 diabetes mellitus without complication, without long-term current use of insulin ([place]) Hold oral diabetes medications.  Check fingerstick blood sugars q ac and hs.  Sliding scale insulin used in hospital. Resume home meds at discharge.  CBG (last 3)   Recent Labs (last 2 labs)    Recent Labs  07/06/16 2158 07/07/16 0740 07/07/16 1129  GLUCAP 129* 124* 135*     Essential hypertension Resume oral home blood pressure medications      Vitals:   07/07/16 0624 07/07/16 0837  BP: (!) 127/54 (!) 139/58  Pulse: 64 71  Resp: 18   Temp: 97.7 F (36.5 C)     DVT prophylaxis:Lovenox.  Code Status:Full Disposition Plan:Home soon with [facility] services 1/8 Consults called:surgery Admission status:Inpatient  Discharge Diagnoses:  Principal Problem:   SBO (small bowel obstruction) Active Problems:   Controlled type 2 diabetes mellitus without complication, without long-term current use of insulin ([facility])   Nausea and vomiting   Essential hypertension  Discharge Instructions  Discharge Instructions    Increase activity slowly    Complete by:  As directed      Allergies as of 07/08/2016      Reactions   Phenergan [promethazine Hcl]    hallucinations   Prednisone    rash   Statins    Severe myalgias  Medication List    TAKE these medications   ALPRAZolam 0.25 MG tablet Commonly known as:  XANAX TAKE ONE TABLET BY MOUTH ONCE DAILY AT BEDTIME   amLODipine 10 MG tablet Commonly known as:  NORVASC Take 1 tablet (10 mg total) by mouth daily.   aspirin 81 MG  tablet Take 81 mg by mouth daily.   beta carotene w/minerals tablet Take 1 tablet by mouth daily.   cyanocobalamin 2000 MCG tablet Take 2,000 mcg by mouth daily.   hydrALAZINE 25 MG tablet Commonly known as:  APRESOLINE 1 q8 hours if systolic greater than [id number] What changed:  how much to take  how to take this  when to take this  reasons to take this  additional instructions   indapamide 2.5 MG tablet Commonly known as:  LOZOL Take 1 tablet (2.5 mg total) by mouth daily.   losartan 100 MG tablet Commonly known as:  COZAAR Take 1 tablet (100 mg total) by mouth daily.   metFORMIN 1000 MG tablet Commonly known as:  GLUCOPHAGE TAKE ONE TABLET BY MOUTH TWICE DAILY **INCREASED  DOSE** What changed:  See the new instructions.   polyvinyl alcohol 1.4 % ophthalmic solution Commonly known as:  LIQUIFILM TEARS Place 1 drop into both eyes 2 (two) times daily.   VITAMIN D PO Take by mouth. Vit d3 1,00 iu daily      Follow-up Information    [facility].   Contact information: [place] [address] [phone]        Sallee Lange, MD. Schedule an appointment as soon as possible for a visit in 1 week(s).   Specialty:  Family Medicine Why:  Hospital Follow Up  Contact information: [place] [address] [phone]          Allergies  Allergen Reactions  . Phenergan [Promethazine Hcl]     hallucinations  . Prednisone     rash  . Statins     Severe myalgias    Procedures/Studies: Ct Abdomen Pelvis W Contrast  Result Date: 07/02/2016 CLINICAL DATA:  Non radiating waxing and waning abdominal pain EXAM: CT ABDOMEN AND PELVIS WITH CONTRAST TECHNIQUE: Multidetector CT imaging of the abdomen and pelvis was performed using the standard protocol following bolus administration of intravenous contrast. CONTRAST:  135mL ISOVUE-300 IOPAMIDOL (ISOVUE-300) INJECTION 61% COMPARISON:  06/18/2015  FINDINGS: Lower chest: Mild atelectasis in the right middle lobe. No acute consolidation or pleural effusion. Heart size upper normal. Trace pericardial effusion. Hepatobiliary: Calcified liver granuloma. No intra hepatic biliary dilatation. Multiple calcified stones in the gallbladder, no wall thickening. Small calcified stone in the cystic duct. Pancreas: Unremarkable. No pancreatic ductal dilatation or surrounding inflammatory changes. Spleen: Normal in size without focal abnormality. Adrenals/Urinary Tract: Adrenal glands are within normal limits. No focal renal abnormalities. Small cystocele. Bladder otherwise unremarkable. Stomach/Bowel: The stomach is nonenlarged. Small hiatal hernia. There are mildly dilated loops of fluid-filled small bowel within the central and anterior abdomen measuring up to 3.3 cm in diameter. Bowel loops appear adhesed to the anterior abdominal wall. At least 1 transition point visualized along the left upper anterior abdominal wall, series 2, image number 53, findings are likely secondary to adhesions. Contrast filled nondilated distal small bowel. No contrast in the colon. No colon wall thickening. Vascular/Lymphatic: Atherosclerotic vascular calcifications. No significantly enlarged lymph nodes. Reproductive: Status post hysterectomy. No adnexal masses. Other: Evidence of prior ventral hernia repair. No free air or free fluid.  Musculoskeletal: Degenerative changes of the spine. No acute or suspicious bone lesions. IMPRESSION: 1. Multiple loops of mildly dilated small bowel up to 3.3 cm. Dilated small bowel loops appear adhesed to the anterior abdominal wall with at least 1 transition point visualized within the left anterior abdomen. Findings are suspicious for a bowel obstruction, likely related to adhesions. 2. Gallstones. Small stone in the cystic duct. No evidence for biliary dilatation or gallbladder inflammation. Electronically Signed   By: Donavan Foil M.D.   On: 07/02/2016  23:22   Dg Abd Portable 1v-small Bowel Obstruction Protocol-initial, 8 Hr Delay  Result Date: 07/03/2016 CLINICAL DATA:  83 y/o female with small bowel obstruction. 8 hour film status post Gastrografin administration via NG tube. Initial encounter. EXAM: PORTABLE ABDOMEN - 1 VIEW COMPARISON:  0318 hours today and earlier. FINDINGS: Portable AP supine view at 1232 hours. Enteric tube remains in place, side hole to level of the distal gastric body. Cholelithiasis re- demonstrated. Sequelae of ventral abdominal hernia re- demonstrated. Since 0318 hours today oral contrast now opacifies the majority of small bowel loops, but has not yet reached the cecum. Some residual excreted IV contrast suspected in the urinary bladder. Large bowel appears to remain decompressed. Stable visualized osseous structures. IMPRESSION: 1. Gastrografin administered 8 hours ago now all opacifies the majority of small bowel loops, but has not reached the cecum. Constellation compatible with partial small bowel obstruction. 2. Enteric tube side hole at the level of the distal stomach. 3. Cholelithiasis. Previous ventral abdominal hernia repair with mesh. Electronically Signed   By: [name] M.D.   On: 07/03/2016 13:10   Dg Abd Portable 1v-small Bowel Protocol-position Verification  Result Date: 07/03/2016 CLINICAL DATA:  Nasogastric tube placement EXAM: PORTABLE ABDOMEN - 1 VIEW COMPARISON:  None. FINDINGS: The nasogastric tube extends well into the stomach with tip in the region of the mid gastric body. Moderately dilated small bowel loops are again evident. IMPRESSION: Nasogastric tube extends well into the stomach. Electronically Signed   By: Andreas Newport M.D.   On: 07/03/2016 03:35    (Echo, Carotid, EGD, Colonoscopy, ERCP)    Subjective: Pt feels much better, eating and drinking well, no emesis. Normal BMs.   Discharge Exam: Vitals:   07/08/16 0648 07/08/16 0827  BP: (!) 152/52 (!) 150/60  Pulse: 64   Resp: 15    Temp: 98.3 F (36.8 C)    Vitals:   07/07/16 1443 07/07/16 2039 07/08/16 0648 07/08/16 0827  BP: (!) 120/54 (!) 138/50 (!) 152/52 (!) 150/60  Pulse: 66 72 64   Resp: 16 20 15    Temp: 98 F (36.7 C) 98.7 F (37.1 C) 98.3 F (36.8 C)   TempSrc: Oral Oral Oral   SpO2: 96% 95% 98%   Weight:   76.1 kg (167 lb 11.2 oz)   Height:        General: Pt is alert, awake, not in acute distress Cardiovascular: RRR, S1/S2 +, no rubs, no gallops Respiratory: CTA bilaterally, no wheezing, no rhonchi Abdominal: Soft, NT, ND, bowel sounds + Extremities: no edema, no cyanosis  The results of significant diagnostics from this hospitalization (including imaging, microbiology, ancillary and laboratory) are listed below for reference.     Microbiology: Recent Results (from the past 240 hour(s))  MRSA PCR Screening     Status: None   Collection Time: 07/03/16  1:57 AM  Result Value Ref Range Status   MRSA by PCR NEGATIVE NEGATIVE Final    Comment:  The GeneXpert MRSA Assay (FDA approved for NASAL specimens only), is one component of a comprehensive MRSA colonization surveillance program. It is not intended to diagnose MRSA infection nor to guide or monitor treatment for MRSA infections.      Labs: BNP (last 3 results)  Recent Labs  02/22/16 1212 02/24/16 1110  BNP 51.8 99991111   Basic Metabolic Panel:  Recent Labs Lab 07/02/16 2157 07/03/16 0526 07/05/16 0614 07/06/16 0717  NA 137 137 141 139  K 3.6 3.7 3.5 3.3*  CL 98* 101 103 103  CO2 25 27 32 31  GLUCOSE 170* 162* 125* 135*  BUN 23* 21* 31* 20  CREATININE 0.97 0.82 1.04* 0.78  CALCIUM 9.8 9.0 8.0* 8.1*  MG 1.5*  --   --   --    Liver Function Tests:  Recent Labs Lab 07/02/16 2157 07/03/16 0526 07/05/16 0614  AST 22 17 11*  ALT 15 12* 9*  ALKPHOS 91 80 60  BILITOT 0.8 0.8 1.4*  PROT 7.2 6.6 5.5*  ALBUMIN 4.1 3.8 2.9*    Recent Labs Lab 07/02/16 2157  LIPASE 28   No results for input(s):  AMMONIA in the last 168 hours. CBC:  Recent Labs Lab 07/02/16 2157 07/03/16 0526 07/05/16 0614  WBC 14.7* 13.5* 7.1  HGB 13.2 12.6 11.3*  HCT 40.4 38.0 35.3*  MCV 90.0 90.9 93.4  PLT 296 285 241   Cardiac Enzymes: No results for input(s): CKTOTAL, CKMB, CKMBINDEX, TROPONINI in the last 168 hours. BNP: Invalid input(s): POCBNP CBG:  Recent Labs Lab 07/07/16 1129 07/07/16 1613 07/07/16 2038 07/08/16 0749 07/08/16 1138  GLUCAP 135* 127* 132* 134* 119*   D-Dimer No results for input(s): DDIMER in the last 72 hours. Hgb A1c No results for input(s): HGBA1C in the last 72 hours. Lipid Profile No results for input(s): CHOL, HDL, LDLCALC, TRIG, CHOLHDL, LDLDIRECT in the last 72 hours. Thyroid function studies No results for input(s): TSH, T4TOTAL, T3FREE, THYROIDAB in the last 72 hours.  Invalid input(s): FREET3 Anemia work up No results for input(s): VITAMINB12, FOLATE, FERRITIN, TIBC, IRON, RETICCTPCT in the last 72 hours. Urinalysis    Component Value Date/Time   COLORURINE YELLOW 07/02/2016 2056   APPEARANCEUR HAZY (A) 07/02/2016 2056   LABSPEC 1.009 07/02/2016 2056   PHURINE 7.0 07/02/2016 2056   GLUCOSEU NEGATIVE 07/02/2016 2056   HGBUR NEGATIVE 07/02/2016 2056   BILIRUBINUR NEGATIVE 07/02/2016 2056   KETONESUR NEGATIVE 07/02/2016 2056   PROTEINUR NEGATIVE 07/02/2016 2056   UROBILINOGEN 0.2 12/15/2014 1956   NITRITE POSITIVE (A) 07/02/2016 2056   LEUKOCYTESUR LARGE (A) 07/02/2016 2056   Sepsis Labs Invalid input(s): PROCALCITONIN,  WBC,  LACTICIDVEN Microbiology Recent Results (from the past 240 hour(s))  MRSA PCR Screening     Status: None   Collection Time: 07/03/16  1:57 AM  Result Value Ref Range Status   MRSA by PCR NEGATIVE NEGATIVE Final    Comment:        The GeneXpert MRSA Assay (FDA approved for NASAL specimens only), is one component of a comprehensive MRSA colonization surveillance program. It is not intended to diagnose MRSA infection  nor to guide or monitor treatment for MRSA infections.    Time coordinating discharge: 31 minutes  SIGNED:  Irwin Brakeman, MD  [facility] 07/08/2016, 11:59 AM Pager   If 7PM-7AM, please contact night-coverage [email or web address] Password [email or web address]

## 2016-07-08 NOTE — Progress Notes (Signed)
PT Cancellation Note  Patient Details Name: [name] MRN: [id number] DOB: 1932/03/22   Cancelled Treatment:   DC order signed before patient able to be seen by PT.   Deniece Ree PT, DPT [phone]

## 2016-07-08 NOTE — Progress Notes (Signed)
Pt will be discharged with son home. Discharge instructions given and all questions answered.

## 2016-07-09 ENCOUNTER — Telehealth: Payer: Self-pay | Admitting: Family Medicine

## 2016-07-09 DIAGNOSIS — K56609 Unspecified intestinal obstruction, unspecified as to partial versus complete obstruction: Secondary | ICD-10-CM

## 2016-07-09 NOTE — Telephone Encounter (Signed)
I spoke to patient. She states she is doing better, still low energy.  I advised her of labs and appt being sch for 7-10.

## 2016-07-09 NOTE — Addendum Note (Signed)
Addended by: Dorothyann Gibbs on: 07/09/2016 11:13 AM   Modules accepted: Orders

## 2016-07-09 NOTE — Telephone Encounter (Signed)
What labs would you like ordered?

## 2016-07-09 NOTE — Telephone Encounter (Signed)
Metabolic 7, CBC reason small bowel obstruction/abdominal pain

## 2016-07-09 NOTE — Telephone Encounter (Signed)
It was recommended by the hospitalist that the patient follow-up with [place] within the next 7-10 days. Patient was in the hospital with a small bowel obstruction which has resolved. Hospitalist once a patient to follow-up for an office visit with [place] plus will need some follow-up lab work. These call patient connect with her make sure she is doing okay if she is having significant problems she will need to be seen ASAP if she feels she is hanging in there please schedule office visit within 10 days.

## 2016-07-10 DIAGNOSIS — Z7984 Long term (current) use of oral hypoglycemic drugs: Secondary | ICD-10-CM | POA: Diagnosis not present

## 2016-07-10 DIAGNOSIS — R2689 Other abnormalities of gait and mobility: Secondary | ICD-10-CM | POA: Diagnosis not present

## 2016-07-10 DIAGNOSIS — E119 Type 2 diabetes mellitus without complications: Secondary | ICD-10-CM | POA: Diagnosis not present

## 2016-07-10 DIAGNOSIS — Z7982 Long term (current) use of aspirin: Secondary | ICD-10-CM | POA: Diagnosis not present

## 2016-07-10 DIAGNOSIS — M6281 Muscle weakness (generalized): Secondary | ICD-10-CM | POA: Diagnosis not present

## 2016-07-10 DIAGNOSIS — K56699 Other intestinal obstruction unspecified as to partial versus complete obstruction: Secondary | ICD-10-CM | POA: Diagnosis not present

## 2016-07-10 DIAGNOSIS — I10 Essential (primary) hypertension: Secondary | ICD-10-CM | POA: Diagnosis not present

## 2016-07-10 DIAGNOSIS — Z9181 History of falling: Secondary | ICD-10-CM | POA: Diagnosis not present

## 2016-07-12 DIAGNOSIS — Z7982 Long term (current) use of aspirin: Secondary | ICD-10-CM | POA: Diagnosis not present

## 2016-07-12 DIAGNOSIS — E119 Type 2 diabetes mellitus without complications: Secondary | ICD-10-CM | POA: Diagnosis not present

## 2016-07-12 DIAGNOSIS — M6281 Muscle weakness (generalized): Secondary | ICD-10-CM | POA: Diagnosis not present

## 2016-07-12 DIAGNOSIS — K56699 Other intestinal obstruction unspecified as to partial versus complete obstruction: Secondary | ICD-10-CM | POA: Diagnosis not present

## 2016-07-12 DIAGNOSIS — R2689 Other abnormalities of gait and mobility: Secondary | ICD-10-CM | POA: Diagnosis not present

## 2016-07-12 DIAGNOSIS — I10 Essential (primary) hypertension: Secondary | ICD-10-CM | POA: Diagnosis not present

## 2016-07-15 DIAGNOSIS — K56609 Unspecified intestinal obstruction, unspecified as to partial versus complete obstruction: Secondary | ICD-10-CM | POA: Diagnosis not present

## 2016-07-16 DIAGNOSIS — Z7982 Long term (current) use of aspirin: Secondary | ICD-10-CM | POA: Diagnosis not present

## 2016-07-16 DIAGNOSIS — E119 Type 2 diabetes mellitus without complications: Secondary | ICD-10-CM | POA: Diagnosis not present

## 2016-07-16 DIAGNOSIS — M6281 Muscle weakness (generalized): Secondary | ICD-10-CM | POA: Diagnosis not present

## 2016-07-16 DIAGNOSIS — K56699 Other intestinal obstruction unspecified as to partial versus complete obstruction: Secondary | ICD-10-CM | POA: Diagnosis not present

## 2016-07-16 DIAGNOSIS — R2689 Other abnormalities of gait and mobility: Secondary | ICD-10-CM | POA: Diagnosis not present

## 2016-07-16 DIAGNOSIS — I10 Essential (primary) hypertension: Secondary | ICD-10-CM | POA: Diagnosis not present

## 2016-07-16 LAB — CBC WITH DIFFERENTIAL/PLATELET
BASOS ABS: 0 10*3/uL (ref 0.0–0.2)
Basos: 0 %
EOS (ABSOLUTE): 0.3 10*3/uL (ref 0.0–0.4)
Eos: 4 %
HEMOGLOBIN: 12 g/dL (ref 11.1–15.9)
Hematocrit: 35.6 % (ref 34.0–46.6)
IMMATURE GRANS (ABS): 0.1 10*3/uL (ref 0.0–0.1)
Immature Granulocytes: 1 %
LYMPHS ABS: 1.6 10*3/uL (ref 0.7–3.1)
LYMPHS: 21 %
MCH: 28.9 pg (ref 26.6–33.0)
MCHC: 33.7 g/dL (ref 31.5–35.7)
MCV: 86 fL (ref 79–97)
MONOCYTES: 7 %
Monocytes Absolute: 0.6 10*3/uL (ref 0.1–0.9)
Neutrophils Absolute: 5.3 10*3/uL (ref 1.4–7.0)
Neutrophils: 67 %
PLATELETS: 316 10*3/uL (ref 150–379)
RBC: 4.15 x10E6/uL (ref 3.77–5.28)
RDW: 14.4 % (ref 12.3–15.4)
WBC: 7.9 10*3/uL (ref 3.4–10.8)

## 2016-07-16 LAB — BASIC METABOLIC PANEL
BUN/Creatinine Ratio: 25 (ref 12–28)
BUN: 21 mg/dL (ref 8–27)
CALCIUM: 9.9 mg/dL (ref 8.7–10.3)
CHLORIDE: 98 mmol/L (ref 96–106)
CO2: 27 mmol/L (ref 18–29)
Creatinine, Ser: 0.83 mg/dL (ref 0.57–1.00)
GFR calc Af Amer: 76 mL/min/{1.73_m2} (ref >59)
GFR calc non Af Amer: 66 mL/min/{1.73_m2} (ref >59)
GLUCOSE: 137 mg/dL — AB (ref 65–99)
Potassium: 4.9 mmol/L (ref 3.5–5.2)
Sodium: 142 mmol/L (ref 134–144)

## 2016-07-17 ENCOUNTER — Ambulatory Visit: Payer: Medicare Other | Admitting: Family Medicine

## 2016-07-18 DIAGNOSIS — E119 Type 2 diabetes mellitus without complications: Secondary | ICD-10-CM | POA: Diagnosis not present

## 2016-07-18 DIAGNOSIS — R2689 Other abnormalities of gait and mobility: Secondary | ICD-10-CM | POA: Diagnosis not present

## 2016-07-18 DIAGNOSIS — M6281 Muscle weakness (generalized): Secondary | ICD-10-CM | POA: Diagnosis not present

## 2016-07-18 DIAGNOSIS — K56699 Other intestinal obstruction unspecified as to partial versus complete obstruction: Secondary | ICD-10-CM | POA: Diagnosis not present

## 2016-07-18 DIAGNOSIS — I10 Essential (primary) hypertension: Secondary | ICD-10-CM | POA: Diagnosis not present

## 2016-07-18 DIAGNOSIS — Z7982 Long term (current) use of aspirin: Secondary | ICD-10-CM | POA: Diagnosis not present

## 2016-07-22 ENCOUNTER — Ambulatory Visit (INDEPENDENT_AMBULATORY_CARE_PROVIDER_SITE_OTHER): Payer: Medicare Other | Admitting: Family Medicine

## 2016-07-22 ENCOUNTER — Encounter: Payer: Self-pay | Admitting: Family Medicine

## 2016-07-22 VITALS — BP 130/84 | Ht 68.0 in | Wt 171.2 lb

## 2016-07-22 DIAGNOSIS — K56600 Partial intestinal obstruction, unspecified as to cause: Secondary | ICD-10-CM

## 2016-07-22 MED ORDER — INDAPAMIDE 2.5 MG PO TABS: 2.5000 mg | ORAL_TABLET | Freq: Every day | ORAL | 1 refills | Status: DC

## 2016-07-22 NOTE — Progress Notes (Signed)
   Subjective:    Patient ID: [name], female    DOB: 01-13-1932, 83 y.o.   MRN: [id number]  HPI Patient arrives for a follow up from recent hospitalization for small bowel obstruction.  Patient with previous small bowel obstruction-she was treated and then released from the hospital. Previous one to this was approximately 13-14 months ago. Patient denies any major setbacks. Denies bloody stools vomiting fever chills sweats. PMH benign  Review of Systems  Constitutional: Positive for fatigue. Negative for fever.  HENT: Negative for congestion.   Respiratory: Negative for cough and shortness of breath.   Cardiovascular: Negative for chest pain.  Gastrointestinal: Negative for abdominal distention and abdominal pain.  Genitourinary: Negative for flank pain and frequency.       Objective:   Physical Exam  Constitutional: She appears well-nourished. No distress.  Cardiovascular: Normal rate, regular rhythm and normal heart sounds.   No murmur heard. Pulmonary/Chest: Effort normal and breath sounds normal. No respiratory distress.  Abdominal: Soft. She exhibits no distension. There is no tenderness.  Musculoskeletal: She exhibits no edema.  Lymphadenopathy:    She has no cervical adenopathy.  Neurological: She is alert. She exhibits normal muscle tone.  Psychiatric: Her behavior is normal.  Vitals reviewed.         Assessment & Plan:  Significant reoccurring small bowel obstruction partial. No surgery indicated. Probably due to adhesions. Hopefully will not keep reoccurring. Patient will follow-up for her regular checkup later this spring. Warning signs regarding obstruction were discussed. Follow-up with [place] sooner if any issues.

## 2016-07-24 DIAGNOSIS — Z7982 Long term (current) use of aspirin: Secondary | ICD-10-CM | POA: Diagnosis not present

## 2016-07-24 DIAGNOSIS — E119 Type 2 diabetes mellitus without complications: Secondary | ICD-10-CM | POA: Diagnosis not present

## 2016-07-24 DIAGNOSIS — I10 Essential (primary) hypertension: Secondary | ICD-10-CM | POA: Diagnosis not present

## 2016-07-24 DIAGNOSIS — M6281 Muscle weakness (generalized): Secondary | ICD-10-CM | POA: Diagnosis not present

## 2016-07-24 DIAGNOSIS — R2689 Other abnormalities of gait and mobility: Secondary | ICD-10-CM | POA: Diagnosis not present

## 2016-07-24 DIAGNOSIS — K56699 Other intestinal obstruction unspecified as to partial versus complete obstruction: Secondary | ICD-10-CM | POA: Diagnosis not present

## 2016-07-29 DIAGNOSIS — K56699 Other intestinal obstruction unspecified as to partial versus complete obstruction: Secondary | ICD-10-CM | POA: Diagnosis not present

## 2016-07-29 DIAGNOSIS — I10 Essential (primary) hypertension: Secondary | ICD-10-CM | POA: Diagnosis not present

## 2016-07-29 DIAGNOSIS — Z7982 Long term (current) use of aspirin: Secondary | ICD-10-CM | POA: Diagnosis not present

## 2016-07-29 DIAGNOSIS — R2689 Other abnormalities of gait and mobility: Secondary | ICD-10-CM | POA: Diagnosis not present

## 2016-07-29 DIAGNOSIS — M6281 Muscle weakness (generalized): Secondary | ICD-10-CM | POA: Diagnosis not present

## 2016-07-29 DIAGNOSIS — E119 Type 2 diabetes mellitus without complications: Secondary | ICD-10-CM | POA: Diagnosis not present

## 2016-07-31 DIAGNOSIS — R2689 Other abnormalities of gait and mobility: Secondary | ICD-10-CM | POA: Diagnosis not present

## 2016-07-31 DIAGNOSIS — I10 Essential (primary) hypertension: Secondary | ICD-10-CM | POA: Diagnosis not present

## 2016-07-31 DIAGNOSIS — M6281 Muscle weakness (generalized): Secondary | ICD-10-CM | POA: Diagnosis not present

## 2016-07-31 DIAGNOSIS — K56699 Other intestinal obstruction unspecified as to partial versus complete obstruction: Secondary | ICD-10-CM | POA: Diagnosis not present

## 2016-08-01 NOTE — Progress Notes (Signed)
Cardiology Office Note   Date:  08/05/2016   ID:  Yonna, Dippold [birth year], MRN [id number]  PCP:  Sallee Lange, MD  Cardiologist:   Jenkins Rouge, MD   No chief complaint on file.     History of Present Illness: [name] is a 83 y.o. female who presents for evaluation of tachycardia dyspnea and fatigue First seen by me August 2017 Seen in ER  02/25/16  She has history of HTN and DM  Symptoms for about 2 weeks Given lasix by primary In ER Hct ok  BNP only 51 CXR with mild scarring mild hyperinflation NAD  Troponin negative   Echo 02/25/16 reviewed : EF normal 55-60%  Small pericardial effusion    Had headache  with elevated BP and palpitations. Episode lasted about 30 minutes better now but "strength sapped out of her" Dr Wolfgang Phoenix gave her hydralazine to take if she feels this way and BP up She has not used yet   07/08/16 Admitted AP with SBO History of hysterectomy and colectomy  Rx conservatively with NG tube and bowel rest No need for surgery    Past Medical History:  Diagnosis Date  . Diabetes mellitus without complication ([place])   . Hypertension     Past Surgical History:  Procedure Laterality Date  . ABDOMINAL HYSTERECTOMY    . APPENDECTOMY    . BREAST REDUCTION SURGERY    . CARDIAC CATHETERIZATION    . COLON SURGERY Left    Hemicolectomy due to diverticulitis  . EYE SURGERY  03/30/2009   cataract  . KNEE SURGERY    . LAPAROSCOPIC INCISIONAL / UMBILICAL / VENTRAL HERNIA REPAIR  02/23/2007   . REFRACTIVE SURGERY       Current Outpatient Prescriptions  Medication Sig Dispense Refill  . ALPRAZolam (XANAX) 0.25 MG tablet TAKE ONE TABLET BY MOUTH ONCE DAILY AT BEDTIME 30 tablet 5  . amLODipine (NORVASC) 10 MG tablet Take 1 tablet (10 mg total) by mouth daily. 90 tablet 1  . aspirin 81 MG tablet Take 81 mg by mouth daily.      . beta carotene w/minerals (OCUVITE) tablet Take 1 tablet by mouth daily.    . Cholecalciferol (VITAMIN D PO) Take by mouth. Vit  d3 1,00 iu daily    . cyanocobalamin 2000 MCG tablet Take 2,000 mcg by mouth daily.    . hydrALAZINE (APRESOLINE) 25 MG tablet 1 q8 hours if systolic greater than A999333 (Patient taking differently: Take 25 mg by mouth every 8 (eight) hours as needed. 1 q8 hours if systolic greater than A999333) 30 tablet 1  . indapamide (LOZOL) 2.5 MG tablet Take 1 tablet (2.5 mg total) by mouth daily. 90 tablet 1  . losartan (COZAAR) 100 MG tablet Take 1 tablet (100 mg total) by mouth daily. 30 tablet 6  . metFORMIN (GLUCOPHAGE) 1000 MG tablet TAKE ONE TABLET BY MOUTH TWICE DAILY **INCREASED  DOSE** (Patient taking differently: TAKE ONE TABLET BY MOUTH TWICE DAILY) 180 tablet 1  . polyvinyl alcohol (LIQUIFILM TEARS) 1.4 % ophthalmic solution Place 1 drop into both eyes 2 (two) times daily.     No current facility-administered medications for this visit.     Allergies:   Phenergan [promethazine hcl]; Prednisone; and Statins    Social History:  The patient  reports that she has never smoked. She has never used smokeless tobacco. She reports that she does not drink alcohol or use drugs.   Family History:  The patient's family  history includes Cancer in her father and mother; Diabetes in her brother; Hypertension in her father and sister; Stroke in her father.    ROS:  Please see the history of present illness.   Otherwise, review of systems are positive for none.   All other systems are reviewed and negative.    PHYSICAL EXAM: VS:  BP 134/64   Pulse 78   Ht 5\' 8"  (1.727 m)   Wt 171 lb (77.6 kg)   SpO2 99%   BMI 26.00 kg/m  , BMI Body mass index is 26 kg/m. Affect appropriate Healthy:  appears stated age 57: normal Neck supple with no adenopathy JVP normal no bruits no thyromegaly Lungs clear with no wheezing and good diaphragmatic motion Heart:  S1/S2 no murmur, no rub, gallop or click PMI normal Abdomen: benighn, BS positve, no tenderness, no AAA no bruit.  No HSM or HJR Distal pulses intact with  no bruits No edema Neuro non-focal Skin warm and dry No muscular weakness    EKG:   02/27/16  SR lack of R waves precordium ? Old ASMI  05/15/16     Recent Labs: 02/24/2016: B Natriuretic Peptide 24.9 03/11/2016: TSH 1.650 07/02/2016: Magnesium 1.5 07/05/2016: ALT 9; Hemoglobin 11.3 07/15/2016: BUN 21; Creatinine, Ser 0.83; Platelets 316; Potassium 4.9; Sodium 142    Lipid Panel    Component Value Date/Time   CHOL 171 09/08/2015 0939   TRIG 163 (H) 09/08/2015 0939   HDL 51 09/08/2015 0939   CHOLHDL 3.4 09/08/2015 0939   CHOLHDL 3.3 07/05/2014 0907   VLDL 22 07/05/2014 0907   LDLCALC 87 09/08/2015 0939      Wt Readings from Last 3 Encounters:  08/05/16 171 lb (77.6 kg)  07/22/16 171 lb 3.2 oz (77.7 kg)  07/08/16 167 lb 11.2 oz (76.1 kg)      Other studies Reviewed: Additional studies/ records that were reviewed today include: ER notes, ECG, CXR Notes Dr Wolfgang Phoenix .    ASSESSMENT AND PLAN:  1. Tachycardia:  Recurrent episode one since August not frequent enough for event monitor will call If recurs  2. Dyspnea:  Resolved BNP normal CXR ok doubt PE ? Viral syndrome with pleuropericarditis Echo with normal EF and no valve disease  3. HTN;  Well controlled.  Continue current medications and low sodium Dash type diet.  PRN hydralazine For spikes per Dr Wolfgang Phoenix  4. Pericardial effusion : small last echo 02/25/16 exam with no pulsus/ or JVP elevation no need for f/u echo at this time 5. DM:  Discussed low carb diet.  Target hemoglobin A1c is 6.5 or less.  Continue current medications. 6. SBO: f/u GI related to previous abdominal surgery she indicates if recurs and needs surgery wants Dr Lucia Gaskins to do     Current medicines are reviewed at length with the patient today.  The patient does not have concerns regarding medicines.  The following changes have been made:  no change  Labs/ tests ordered today include: None   No orders of the defined types were placed in this  encounter.    Disposition:   FU with me ina year     Signed, Jenkins Rouge, MD  08/05/2016 10:12 AM    [address], [place], [place]  [address] Phone: [phone]; Fax: [phone]

## 2016-08-05 ENCOUNTER — Encounter: Payer: Self-pay | Admitting: Cardiovascular Disease

## 2016-08-05 ENCOUNTER — Ambulatory Visit (INDEPENDENT_AMBULATORY_CARE_PROVIDER_SITE_OTHER): Payer: Medicare Other | Admitting: Cardiovascular Disease

## 2016-08-05 VITALS — BP 134/64 | HR 78 | Ht 68.0 in | Wt 171.0 lb

## 2016-08-05 DIAGNOSIS — I10 Essential (primary) hypertension: Secondary | ICD-10-CM

## 2016-08-05 NOTE — Patient Instructions (Signed)
Your physician wants you to follow-up in: 6 Months with Dr. Nishan. You will receive a reminder letter in the mail two months in advance. If you don't receive a letter, please call our office to schedule the follow-up appointment.  Your physician recommends that you continue on your current medications as directed. Please refer to the Current Medication list given to you today.  If you need a refill on your cardiac medications before your next appointment, please call your pharmacy.  Thank you for choosing [facility]!   

## 2016-08-13 DIAGNOSIS — M71571 Other bursitis, not elsewhere classified, right ankle and foot: Secondary | ICD-10-CM | POA: Diagnosis not present

## 2016-08-13 DIAGNOSIS — E1142 Type 2 diabetes mellitus with diabetic polyneuropathy: Secondary | ICD-10-CM | POA: Diagnosis not present

## 2016-08-13 DIAGNOSIS — M2011 Hallux valgus (acquired), right foot: Secondary | ICD-10-CM | POA: Diagnosis not present

## 2016-09-03 DIAGNOSIS — M2011 Hallux valgus (acquired), right foot: Secondary | ICD-10-CM | POA: Diagnosis not present

## 2016-09-03 DIAGNOSIS — M71571 Other bursitis, not elsewhere classified, right ankle and foot: Secondary | ICD-10-CM | POA: Diagnosis not present

## 2016-09-04 DIAGNOSIS — M1711 Unilateral primary osteoarthritis, right knee: Secondary | ICD-10-CM | POA: Diagnosis not present

## 2016-09-11 DIAGNOSIS — M1711 Unilateral primary osteoarthritis, right knee: Secondary | ICD-10-CM | POA: Diagnosis not present

## 2016-09-18 ENCOUNTER — Telehealth: Payer: Self-pay | Admitting: Family Medicine

## 2016-09-18 ENCOUNTER — Other Ambulatory Visit: Payer: Self-pay | Admitting: Family Medicine

## 2016-09-18 DIAGNOSIS — E785 Hyperlipidemia, unspecified: Secondary | ICD-10-CM

## 2016-09-18 DIAGNOSIS — E119 Type 2 diabetes mellitus without complications: Secondary | ICD-10-CM

## 2016-09-18 DIAGNOSIS — I10 Essential (primary) hypertension: Secondary | ICD-10-CM

## 2016-09-18 NOTE — Telephone Encounter (Signed)
Blood work ordered in EPIC. Patient notified. 

## 2016-09-18 NOTE — Telephone Encounter (Signed)
Patient wanting to know if it was time for labs. She has follow up appointment on 4/4.

## 2016-09-18 NOTE — Telephone Encounter (Signed)
Lipid, metabolic 7, hemoglobin H2U, urine ACR

## 2016-09-18 NOTE — Telephone Encounter (Signed)
Cbc with diff, met 7, liver 07/2016 then HgbA1c 05/2016 then TSH and T4 03/2016

## 2016-09-19 ENCOUNTER — Ambulatory Visit: Payer: Medicare Other | Admitting: Family Medicine

## 2016-09-19 DIAGNOSIS — M1711 Unilateral primary osteoarthritis, right knee: Secondary | ICD-10-CM | POA: Diagnosis not present

## 2016-09-27 ENCOUNTER — Other Ambulatory Visit: Payer: Self-pay | Admitting: Family Medicine

## 2016-09-27 DIAGNOSIS — I10 Essential (primary) hypertension: Secondary | ICD-10-CM | POA: Diagnosis not present

## 2016-09-27 DIAGNOSIS — E119 Type 2 diabetes mellitus without complications: Secondary | ICD-10-CM | POA: Diagnosis not present

## 2016-09-27 DIAGNOSIS — E785 Hyperlipidemia, unspecified: Secondary | ICD-10-CM | POA: Diagnosis not present

## 2016-09-28 LAB — BASIC METABOLIC PANEL
BUN/Creatinine Ratio: 24 (ref 12–28)
BUN: 20 mg/dL (ref 8–27)
CALCIUM: 9.9 mg/dL (ref 8.7–10.3)
CHLORIDE: 98 mmol/L (ref 96–106)
CO2: 26 mmol/L (ref 18–29)
Creatinine, Ser: 0.83 mg/dL (ref 0.57–1.00)
GFR calc non Af Amer: 65 mL/min/{1.73_m2} (ref >59)
GFR, EST AFRICAN AMERICAN: 75 mL/min/{1.73_m2} (ref >59)
GLUCOSE: 101 mg/dL — AB (ref 65–99)
POTASSIUM: 4.6 mmol/L (ref 3.5–5.2)
Sodium: 143 mmol/L (ref 134–144)

## 2016-09-28 LAB — LIPID PANEL
CHOLESTEROL TOTAL: 171 mg/dL (ref 100–199)
Chol/HDL Ratio: 4.2 ratio units (ref 0.0–4.4)
HDL: 41 mg/dL (ref >39)
LDL Calculated: 94 mg/dL (ref 0–99)
Triglycerides: 180 mg/dL — ABNORMAL HIGH (ref 0–149)
VLDL CHOLESTEROL CAL: 36 mg/dL (ref 5–40)

## 2016-09-28 LAB — HEMOGLOBIN A1C
Est. average glucose Bld gHb Est-mCnc: 148 mg/dL
HEMOGLOBIN A1C: 6.8 % — AB (ref 4.8–5.6)

## 2016-09-28 LAB — MICROALBUMIN / CREATININE URINE RATIO
Creatinine, Urine: 69.1 mg/dL
MICROALB/CREAT RATIO: 6.2 mg/g{creat} (ref 0.0–30.0)
MICROALBUM., U, RANDOM: 4.3 ug/mL

## 2016-09-30 ENCOUNTER — Other Ambulatory Visit: Payer: Self-pay | Admitting: Family Medicine

## 2016-10-02 ENCOUNTER — Ambulatory Visit (INDEPENDENT_AMBULATORY_CARE_PROVIDER_SITE_OTHER): Payer: Medicare Other | Admitting: Family Medicine

## 2016-10-02 ENCOUNTER — Encounter: Payer: Self-pay | Admitting: Family Medicine

## 2016-10-02 VITALS — BP 138/82 | Ht 68.0 in | Wt 174.0 lb

## 2016-10-02 DIAGNOSIS — E785 Hyperlipidemia, unspecified: Secondary | ICD-10-CM

## 2016-10-02 DIAGNOSIS — E1169 Type 2 diabetes mellitus with other specified complication: Secondary | ICD-10-CM

## 2016-10-02 DIAGNOSIS — G4700 Insomnia, unspecified: Secondary | ICD-10-CM

## 2016-10-02 DIAGNOSIS — I10 Essential (primary) hypertension: Secondary | ICD-10-CM | POA: Diagnosis not present

## 2016-10-02 DIAGNOSIS — R600 Localized edema: Secondary | ICD-10-CM

## 2016-10-02 DIAGNOSIS — Z13820 Encounter for screening for osteoporosis: Secondary | ICD-10-CM

## 2016-10-02 DIAGNOSIS — Z780 Asymptomatic menopausal state: Secondary | ICD-10-CM

## 2016-10-02 DIAGNOSIS — E119 Type 2 diabetes mellitus without complications: Secondary | ICD-10-CM | POA: Diagnosis not present

## 2016-10-02 MED ORDER — LORAZEPAM 0.5 MG PO TABS: 0.5000 mg | ORAL_TABLET | Freq: Every evening | ORAL | 0 refills | Status: DC | PRN

## 2016-10-02 NOTE — Progress Notes (Signed)
   Subjective:    Patient ID: [name], female    DOB: 11/02/1931, 83 y.o.   MRN: [id number]  Diabetes  She presents for her follow-up diabetic visit. She has type 2 diabetes mellitus. Risk factors for coronary artery disease include diabetes mellitus, hypertension and post-menopausal. Current diabetic treatment includes oral agent (monotherapy). She is compliant with treatment all of the time. Her weight is stable. She is following a diabetic diet. She has not had a previous visit with a dietitian. Exercise: just started walking. She sees a podiatrist.Eye exam is current (due next month).   Discuss recent labs Patient states she is taken her medicine as directed having some troubles with sleep state Xanax does not help anymore does make her feel somewhat restless she would like try some different States her blood pressures been under good control Doing a better job watching her diet taking diabetic medicine Starnes get back in some walking Him some pedal edema that gets worse throughout the course of the day happened to occur more so after we increased her amlodipine. She denies any PND orthopnea  Review of Systems Denies chest tightness pressure pain shortness breath nausea vomiting diarrhea fever chills sweats states appetite good energy level good denies being depressed no falls    Objective:   Physical Exam Lungs clear hearts regular pulse normal extremities trace edema in the lower legs foot exam normal. Rest of exam normal.  Lab work reviewed in detail cholesterol profile overall pretty good A1c has gone up but is not dramatically up kidney functions sodium-potassium all of this looks good liver function looks good     Assessment & Plan:  Patient is due for bone density testing this was ordered Blood pressure good control continue current measures Diabetes A1c is gone up we'll recheck the skin in 4 months watch diet stay physically active do not add medicines  currently Hyperlipidemia associated with diabetes overall good control previous lab reviewed continue medication watch diet closely Pedal edema associated with increase amlodipine patient will use knee-high surgical support hose Insomnia multiple options discussed there are no perfect options but I think Ativan used intermittently would be the best approach versus anything daily Follow-up 4 months

## 2016-10-07 ENCOUNTER — Ambulatory Visit (HOSPITAL_COMMUNITY)
Admission: RE | Admit: 2016-10-07 | Discharge: 2016-10-07 | Disposition: A | Discharge status: Home / Self Care | Payer: Medicare Other | Source: Ambulatory Visit | Attending: Family Medicine | Admitting: Family Medicine

## 2016-10-07 DIAGNOSIS — M8580 Other specified disorders of bone density and structure, unspecified site: Secondary | ICD-10-CM | POA: Insufficient documentation

## 2016-10-07 DIAGNOSIS — Z13820 Encounter for screening for osteoporosis: Secondary | ICD-10-CM | POA: Insufficient documentation

## 2016-10-07 DIAGNOSIS — M85832 Other specified disorders of bone density and structure, left forearm: Secondary | ICD-10-CM | POA: Diagnosis not present

## 2016-10-07 DIAGNOSIS — Z780 Asymptomatic menopausal state: Secondary | ICD-10-CM | POA: Diagnosis not present

## 2016-10-08 DIAGNOSIS — B351 Tinea unguium: Secondary | ICD-10-CM | POA: Diagnosis not present

## 2016-10-09 ENCOUNTER — Encounter: Payer: Self-pay | Admitting: Family Medicine

## 2016-10-22 DIAGNOSIS — E11319 Type 2 diabetes mellitus with unspecified diabetic retinopathy without macular edema: Secondary | ICD-10-CM | POA: Diagnosis not present

## 2016-10-25 ENCOUNTER — Encounter: Payer: Self-pay | Admitting: Family Medicine

## 2016-10-25 ENCOUNTER — Ambulatory Visit (INDEPENDENT_AMBULATORY_CARE_PROVIDER_SITE_OTHER): Payer: Medicare Other | Admitting: Family Medicine

## 2016-10-25 ENCOUNTER — Other Ambulatory Visit (HOSPITAL_COMMUNITY)
Admission: RE | Admit: 2016-10-25 | Discharge: 2016-10-25 | Disposition: A | Discharge status: Home / Self Care | Payer: Medicare Other | Source: Ambulatory Visit | Attending: Family Medicine | Admitting: Family Medicine

## 2016-10-25 VITALS — BP 138/76 | HR 104 | Temp 99.5°F | Ht 68.0 in | Wt 174.0 lb

## 2016-10-25 DIAGNOSIS — R509 Fever, unspecified: Secondary | ICD-10-CM

## 2016-10-25 DIAGNOSIS — R000 Tachycardia, unspecified: Secondary | ICD-10-CM | POA: Diagnosis not present

## 2016-10-25 DIAGNOSIS — W57XXXA Bitten or stung by nonvenomous insect and other nonvenomous arthropods, initial encounter: Secondary | ICD-10-CM | POA: Diagnosis not present

## 2016-10-25 LAB — CBC WITH DIFFERENTIAL/PLATELET
BASOS PCT: 0 %
Basophils Absolute: 0 10*3/uL (ref 0.0–0.1)
EOS ABS: 0 10*3/uL (ref 0.0–0.7)
Eosinophils Relative: 0 %
HCT: 40.9 % (ref 36.0–46.0)
Hemoglobin: 13.4 g/dL (ref 12.0–15.0)
Lymphocytes Relative: 12 %
Lymphs Abs: 0.9 10*3/uL (ref 0.7–4.0)
MCH: 28.8 pg (ref 26.0–34.0)
MCHC: 32.8 g/dL (ref 30.0–36.0)
MCV: 87.8 fL (ref 78.0–100.0)
MONOS PCT: 5 %
Monocytes Absolute: 0.4 10*3/uL (ref 0.1–1.0)
Neutro Abs: 5.9 10*3/uL (ref 1.7–7.7)
Neutrophils Relative %: 83 %
Platelets: 263 10*3/uL (ref 150–400)
RBC: 4.66 MIL/uL (ref 3.87–5.11)
RDW: 14.1 % (ref 11.5–15.5)
WBC: 7.2 10*3/uL (ref 4.0–10.5)

## 2016-10-25 LAB — POCT URINALYSIS DIPSTICK
PH UA: 5 (ref 5.0–8.0)
Spec Grav, UA: 1.015 (ref 1.010–1.025)

## 2016-10-25 LAB — BASIC METABOLIC PANEL
ANION GAP: 10 (ref 5–15)
BUN: 22 mg/dL — ABNORMAL HIGH (ref 6–20)
CHLORIDE: 99 mmol/L — AB (ref 101–111)
CO2: 27 mmol/L (ref 22–32)
CREATININE: 0.97 mg/dL (ref 0.44–1.00)
Calcium: 9.4 mg/dL (ref 8.9–10.3)
GFR calc non Af Amer: 53 mL/min — ABNORMAL LOW (ref >60)
Glucose, Bld: 140 mg/dL — ABNORMAL HIGH (ref 65–99)
Potassium: 4 mmol/L (ref 3.5–5.1)
SODIUM: 136 mmol/L (ref 135–145)

## 2016-10-25 LAB — HEPATIC FUNCTION PANEL
ALBUMIN: 4.2 g/dL (ref 3.5–5.0)
ALT: 16 U/L (ref 14–54)
AST: 22 U/L (ref 15–41)
Alkaline Phosphatase: 80 U/L (ref 38–126)
BILIRUBIN TOTAL: 1 mg/dL (ref 0.3–1.2)
Bilirubin, Direct: 0.1 mg/dL (ref 0.1–0.5)
Indirect Bilirubin: 0.9 mg/dL (ref 0.3–0.9)
TOTAL PROTEIN: 7.3 g/dL (ref 6.5–8.1)

## 2016-10-25 MED ORDER — DOXYCYCLINE HYCLATE 100 MG PO TABS: 100.0000 mg | ORAL_TABLET | Freq: Two times a day (BID) | ORAL | 0 refills | Status: DC

## 2016-10-25 NOTE — Progress Notes (Signed)
   Subjective:    Patient ID: [name], female    DOB: May 05, 1932, 83 y.o.   MRN: [id number]  Fever   This is a new problem. The current episode started yesterday. Associated symptoms comments: Chills, lethargic, elevated heart rate, loose stools.   Pulled tick off left arm on April 16th.  Relates body aches fatigue tiredness over the past several days some chills loose stools feels like her heart is running faster than normal around 100 or 110 denies wheezing difficulty breathing denies chest pressure tightness pain denies high fevers. Denies abdominal pains. Feels rundown. Family is having a helper some.  Toward the end of the visit she questions whether or not she needs to be in assisted living.  Review of Systems  Constitutional: Positive for fever.  Please see above.     Objective:   Physical Exam Does not appear in acute distress eardrums normal throat is normal neck no masses lungs clear heart regular HEENT benign No rashes seen  Urine looks good    Assessment & Plan:  Viral syndrome Possible underlying illness Possible tick related illness Doxycycline twice a day 10 days Follow-up if progressive troubles or worse Recheck in a few days. Patient sent for stat lab work will follow-up on

## 2016-10-28 LAB — B. BURGDORFI ANTIBODIES: B burgdorferi Ab IgG+IgM: <0.91 {ISR} (ref 0.00–0.90)

## 2016-10-29 ENCOUNTER — Encounter: Payer: Self-pay | Admitting: Family Medicine

## 2016-10-29 ENCOUNTER — Ambulatory Visit (INDEPENDENT_AMBULATORY_CARE_PROVIDER_SITE_OTHER): Payer: Medicare Other | Admitting: Family Medicine

## 2016-10-29 VITALS — BP 138/70 | Temp 98.1°F | Ht 68.0 in | Wt 169.0 lb

## 2016-10-29 DIAGNOSIS — W57XXXD Bitten or stung by nonvenomous insect and other nonvenomous arthropods, subsequent encounter: Secondary | ICD-10-CM

## 2016-10-29 DIAGNOSIS — R5383 Other fatigue: Secondary | ICD-10-CM

## 2016-10-29 LAB — ROCKY MTN SPOTTED FVR ABS PNL(IGG+IGM)
RMSF IGG: NEGATIVE
RMSF IGM: 0.18 {index} (ref 0.00–0.89)

## 2016-10-29 NOTE — Patient Instructions (Signed)
Tick Bite Information Introduction Ticks are insects that attach themselves to the skin. There are many types of ticks. Common types include wood ticks and deer ticks. Sometimes, ticks carry diseases that can make a person very ill. The most common places for ticks to attach themselves are the scalp, neck, armpits, waist, and groin. HOW CAN YOU PREVENT TICK BITES? Take these steps to help prevent tick bites when you are outdoors:  Wear long sleeves and long pants.  Wear white clothes so you can see ticks more easily.  Tuck your pant legs into your socks.  If walking on a trail, stay in the middle of the trail to avoid brushing against bushes.  Avoid walking through areas with long grass.  Put bug spray on all skin that is showing and along boot tops, pant legs, and sleeve cuffs.  Check clothes, hair, and skin often and before going inside.  Brush off any ticks that are not attached.  Take a shower or bath as soon as possible after being outdoors. HOW SHOULD YOU REMOVE A TICK? Ticks should be removed as soon as possible to help prevent diseases. 1. If latex gloves are available, put them on before trying to remove a tick. 2. Use tweezers to grasp the tick as close to the skin as possible. You may also use curved forceps or a tick removal tool. Grasp the tick as close to its head as possible. Avoid grasping the tick on its body. 3. Pull gently upward until the tick lets go. Do not twist the tick or jerk it suddenly. This may break off the tick's head or mouth parts. 4. Do not squeeze or crush the tick's body. This could force disease-carrying fluids from the tick into your body. 5. After the tick is removed, wash the bite area and your hands with soap and water or alcohol. 6. Apply a small amount of antiseptic cream or ointment to the bite site. 7. Wash any tools that were used. Do not try to remove a tick by applying a hot match, petroleum jelly, or fingernail polish to the tick. These  methods do not work. They may also increase the chances of disease being spread from the tick bite. WHEN SHOULD YOU SEEK HELP? Contact your health care provider if you are unable to remove a tick or if a part of the tick breaks off in the skin. After a tick bite, you need to watch for signs and symptoms of diseases that can be spread by ticks. Contact your health care provider if you develop any of the following:  Fever.  Rash.  Redness and puffiness (swelling) in the area of the tick bite.  Tender, puffy lymph glands.  Watery poop (diarrhea).  Weight loss.  Cough.  Feeling more tired than normal (fatigue).  Muscle, joint, or bone pain.  Belly (abdominal) pain.  Headache.  Change in your level of consciousness.  Trouble walking or moving your legs.  Loss of feeling (numbness) in the legs.  Loss of movement (paralysis).  Shortness of breath.  Confusion.  Throwing up (vomiting) many times. This information is not intended to replace advice given to you by your health care provider. Make sure you discuss any questions you have with your health care provider. Document Released: 09/11/2009 Document Revised: 11/23/2015 Document Reviewed: 11/25/2012 Elsevier Interactive Patient Education  2017 [employer]  

## 2016-10-29 NOTE — Progress Notes (Signed)
   Subjective:    Patient ID: [name], female    DOB: [birth year], 83 y.o.   MRN: [id number]  HPIFollow up fever. Pt states she is doing better but still having fatigue.  Patient with significant fatigue that is now doing much better although she states she still having some fatigue she denies chest tightness pressure pain shortness breath cough wheezing vomiting diarrhea. She did have a tick bite had a localized reaction to a currently taking doxycycline twice a day for 7 days denies night sweats denies appetite change.  She states she has been under some stress but she states she does not feel she needs to stay in the rest home or assisted living like she stated on her last visit she feels she can adequately take care of herself   Review of Systems See above.    Objective:   Physical Exam Lungs clear heart regular pulse normal extremities no edema skin warm dry  Lab work reviewed with the patient in detail     Assessment & Plan:  Fatigue tiredness recent illness probable viral she is on doxycycline to cover for possibility of tick-related disease overall should do well if progressive troubles follow-up otherwise limits recheck in 2 months she will watch her appetite energy level if she is getting worse she will follow-up

## 2016-10-30 LAB — CULTURE, BLOOD (SINGLE)
CULTURE: NO GROWTH
SPECIAL REQUESTS: ADEQUATE

## 2016-11-08 DIAGNOSIS — E1142 Type 2 diabetes mellitus with diabetic polyneuropathy: Secondary | ICD-10-CM | POA: Diagnosis not present

## 2016-11-14 ENCOUNTER — Other Ambulatory Visit: Payer: Self-pay | Admitting: Family Medicine

## 2016-11-14 NOTE — Telephone Encounter (Signed)
May have this +4 refills 

## 2016-12-06 ENCOUNTER — Other Ambulatory Visit: Payer: Self-pay | Admitting: Family Medicine

## 2016-12-06 DIAGNOSIS — Z1231 Encounter for screening mammogram for malignant neoplasm of breast: Secondary | ICD-10-CM

## 2016-12-12 ENCOUNTER — Ambulatory Visit (HOSPITAL_COMMUNITY)
Admission: RE | Admit: 2016-12-12 | Discharge: 2016-12-12 | Disposition: A | Discharge status: Home / Self Care | Payer: Medicare Other | Source: Ambulatory Visit | Attending: Family Medicine | Admitting: Family Medicine

## 2016-12-12 DIAGNOSIS — Z1231 Encounter for screening mammogram for malignant neoplasm of breast: Secondary | ICD-10-CM | POA: Insufficient documentation

## 2016-12-13 ENCOUNTER — Other Ambulatory Visit: Payer: Self-pay | Admitting: Family Medicine

## 2016-12-26 DIAGNOSIS — H04123 Dry eye syndrome of bilateral lacrimal glands: Secondary | ICD-10-CM | POA: Diagnosis not present

## 2017-01-03 ENCOUNTER — Telehealth: Payer: Self-pay | Admitting: Family Medicine

## 2017-01-03 NOTE — Telephone Encounter (Signed)
Pt made aware

## 2017-01-03 NOTE — Telephone Encounter (Signed)
Patient has an appointment on 01/08/17 with Dr. Nicki Reaper.  She wants to know if she needs labwork beforehand?

## 2017-01-03 NOTE — Telephone Encounter (Signed)
At this time I do not feel that the patient has to have lab work I looked at her previous lab work. I think it's reasonable to do A1c here in the office

## 2017-01-05 ENCOUNTER — Emergency Department (HOSPITAL_COMMUNITY)
Admission: EM | Admit: 2017-01-05 | Discharge: 2017-01-05 | Disposition: A | Discharge status: Home / Self Care | Payer: Medicare Other | Attending: Emergency Medicine | Admitting: Emergency Medicine

## 2017-01-05 ENCOUNTER — Encounter (HOSPITAL_COMMUNITY): Payer: Self-pay | Admitting: *Deleted

## 2017-01-05 ENCOUNTER — Emergency Department (HOSPITAL_COMMUNITY): Payer: Medicare Other

## 2017-01-05 DIAGNOSIS — S0182XA Laceration with foreign body of other part of head, initial encounter: Secondary | ICD-10-CM | POA: Diagnosis not present

## 2017-01-05 DIAGNOSIS — Y999 Unspecified external cause status: Secondary | ICD-10-CM | POA: Diagnosis not present

## 2017-01-05 DIAGNOSIS — S0232XA Fracture of orbital floor, left side, initial encounter for closed fracture: Secondary | ICD-10-CM | POA: Insufficient documentation

## 2017-01-05 DIAGNOSIS — S199XXA Unspecified injury of neck, initial encounter: Secondary | ICD-10-CM | POA: Diagnosis not present

## 2017-01-05 DIAGNOSIS — S0280XA Fracture of other specified skull and facial bones, unspecified side, initial encounter for closed fracture: Secondary | ICD-10-CM

## 2017-01-05 DIAGNOSIS — S0181XA Laceration without foreign body of other part of head, initial encounter: Secondary | ICD-10-CM

## 2017-01-05 DIAGNOSIS — Y929 Unspecified place or not applicable: Secondary | ICD-10-CM | POA: Insufficient documentation

## 2017-01-05 DIAGNOSIS — S0285XA Fracture of orbit, unspecified, initial encounter for closed fracture: Secondary | ICD-10-CM

## 2017-01-05 DIAGNOSIS — W01110A Fall on same level from slipping, tripping and stumbling with subsequent striking against sharp glass, initial encounter: Secondary | ICD-10-CM | POA: Insufficient documentation

## 2017-01-05 DIAGNOSIS — Y93E9 Activity, other interior property and clothing maintenance: Secondary | ICD-10-CM | POA: Diagnosis not present

## 2017-01-05 MED ORDER — HYDROCODONE-ACETAMINOPHEN 5-325 MG PO TABS: 2.0000 | ORAL_TABLET | ORAL | 0 refills | Status: DC | PRN

## 2017-01-05 MED ORDER — ONDANSETRON 4 MG PO TBDP
4.0000 mg | ORAL_TABLET | Freq: Once | ORAL | Status: AC
  Administered 2017-01-05: 4 mg via ORAL
  Filled 2017-01-05: qty 1

## 2017-01-05 MED ORDER — POVIDONE-IODINE 10 % EX SOLN
CUTANEOUS | Status: AC
  Filled 2017-01-05: qty 118

## 2017-01-05 MED ORDER — BACITRACIN ZINC 500 UNIT/GM EX OINT
1.0000 "application " | TOPICAL_OINTMENT | Freq: Two times a day (BID) | CUTANEOUS | Status: DC
  Administered 2017-01-05: 1 via TOPICAL
  Filled 2017-01-05: qty 0.9

## 2017-01-05 MED ORDER — TETANUS-DIPHTH-ACELL PERTUSSIS 5-2.5-18.5 LF-MCG/0.5 IM SUSP
0.5000 mL | Freq: Once | INTRAMUSCULAR | Status: AC
  Administered 2017-01-05: 0.5 mL via INTRAMUSCULAR
  Filled 2017-01-05: qty 0.5

## 2017-01-05 MED ORDER — LIDOCAINE-EPINEPHRINE (PF) 1 %-1:200000 IJ SOLN
INTRAMUSCULAR | Status: AC
  Filled 2017-01-05: qty 30

## 2017-01-05 MED ORDER — ACETAMINOPHEN 500 MG PO TABS
1000.0000 mg | ORAL_TABLET | Freq: Once | ORAL | Status: AC
  Administered 2017-01-05: 1000 mg via ORAL
  Filled 2017-01-05: qty 2

## 2017-01-05 MED ORDER — ONDANSETRON 4 MG PO TBDP
4.0000 mg | ORAL_TABLET | Freq: Three times a day (TID) | ORAL | 0 refills | Status: DC | PRN
Start: 2017-01-05 — End: 2018-01-27

## 2017-01-05 MED ORDER — ONDANSETRON 4 MG PO TBDP: 4.0000 mg | ORAL_TABLET | Freq: Three times a day (TID) | ORAL | 0 refills | Status: DC | PRN

## 2017-01-05 NOTE — ED Provider Notes (Signed)
[facility] DEPT Provider Note   CSN: [id number] Arrival date & time: 01/05/17  1705     History   Chief Complaint Chief Complaint  Patient presents with  . Laceration    HPI [name] is a 83 y.o. female.   Laceration      83 year old female on a baby aspirin daily who also has diabetes and hypertension who had a minor fall earlier when she was bending over to tie her shoes, lost her balance and fell forward striking her head on the gravel. This broke her glasses and caused a laceration to her left for head in and above the left eyebrow. This was acute in onset, occurred just prior to arrival, is associated with a significant amount of bleeding that caused her to need to hold pressure with a dressing. There was no loss of consciousness, no neck pain, no other complaints other than a slight rash to the palms of her hands for when she tried to catch herself from falling. She does not follow regularly, she walks daily multiple miles and otherwise has been doing well. She feels as though she lost her balance when she tried to bend over to tie her shoe.  Past Medical History:  Diagnosis Date  . Diabetes mellitus without complication ([place])   . Hypertension     Patient Active Problem List   Diagnosis Date Noted  . Hyperlipidemia associated with type 2 diabetes mellitus ([place]) 10/02/2016  . Pedal edema 10/02/2016  . Insomnia 10/02/2016  . SBO (small bowel obstruction) ([place]) 07/02/2016  . Dyspnea on exertion 02/24/2016  . Other fatigue   . E-coli UTI 06/18/2015  . Partial small bowel obstruction ([place]) 06/15/2015  . Ileitis, regional, with intestinal obstruction 06/15/2015  . Leukocytosis 12/16/2014  . Nausea and vomiting 12/15/2014  . HLD (hyperlipidemia) 12/15/2014  . Essential hypertension 12/15/2014  . Controlled type 2 diabetes mellitus without complication, without long-term current use of insulin ([place]) 02/15/2011    Past Surgical History:  Procedure Laterality  Date  . ABDOMINAL HYSTERECTOMY    . APPENDECTOMY    . BREAST REDUCTION SURGERY    . CARDIAC CATHETERIZATION    . COLON SURGERY Left    Hemicolectomy due to diverticulitis  . EYE SURGERY  03/30/2009   cataract  . KNEE SURGERY    . LAPAROSCOPIC INCISIONAL / UMBILICAL / VENTRAL HERNIA REPAIR  02/23/2007   . REFRACTIVE SURGERY      OB History    No data available       Home Medications    Prior to Admission medications   Medication Sig Start Date End Date Taking? Authorizing Provider  amLODipine (NORVASC) 10 MG tablet TAKE ONE TABLET BY MOUTH ONCE DAILY 09/30/16  Yes Kathyrn Drown, MD  aspirin 81 MG tablet Take 81 mg by mouth daily.     Yes [provider]  beta carotene w/minerals (OCUVITE) tablet Take 1 tablet by mouth daily.   Yes [provider]  cholecalciferol (VITAMIN D) 1000 units tablet Take 1,000 Units by mouth daily.   Yes [provider]  cyanocobalamin 2000 MCG tablet Take 2,000 mcg by mouth daily.   Yes [provider]  hydrALAZINE (APRESOLINE) 25 MG tablet 1 q8 hours if systolic greater than 683 Patient taking differently: Take 25 mg by mouth every 8 (eight) hours as needed. 1 q8 hours if systolic greater than 419 03/10/16  Yes Luking, Scott A, MD  indapamide (LOZOL) 2.5 MG tablet Take 1 tablet (2.5 mg  total) by mouth daily. 07/22/16  Yes Luking, Elayne Snare, MD  LORazepam (ATIVAN) 0.5 MG tablet TAKE 1 TABLET BY MOUTH AT BEDTIME AS NEEDED FOR ANXIETY 11/14/16  Yes Luking, Elayne Snare, MD  losartan (COZAAR) 100 MG tablet TAKE 1 TABLET BY MOUTH ONCE DAILY (STOP LOSARTAN/HCTZ) Patient taking differently: TAKE 1 TABLET BY MOUTH ONCE DAILY 12/13/16  Yes Kathyrn Drown, MD  metFORMIN (GLUCOPHAGE) 1000 MG tablet TAKE ONE TABLET BY MOUTH TWICE DAILY 09/30/16  Yes Luking, Scott A, MD  polyvinyl alcohol (LIQUIFILM TEARS) 1.4 % ophthalmic solution Place 1 drop into both eyes 3 (three) times daily.    Yes [provider]  HYDROcodone-acetaminophen  (NORCO/VICODIN) 5-325 MG tablet Take 2 tablets by mouth every 4 (four) hours as needed for moderate pain. 01/05/17   Noemi Chapel, MD  HYDROcodone-acetaminophen (NORCO/VICODIN) 5-325 MG tablet Take 2 tablets by mouth every 4 (four) hours as needed. 01/05/17   Noemi Chapel, MD  ondansetron (ZOFRAN ODT) 4 MG disintegrating tablet Take 1 tablet (4 mg total) by mouth every 8 (eight) hours as needed for nausea. 01/05/17   Noemi Chapel, MD  ondansetron (ZOFRAN ODT) 4 MG disintegrating tablet Take 1 tablet (4 mg total) by mouth every 8 (eight) hours as needed for nausea. 01/05/17   Noemi Chapel, MD    Family History Family History  Problem Relation Age of Onset  . Hypertension Father        kidney  . Stroke Father   . Cancer Father   . Cancer Mother        ovaian  . Diabetes Brother   . Hypertension Sister     Social History Social History  Substance Use Topics  . Smoking status: Never Smoker  . Smokeless tobacco: Never Used  . Alcohol use No     Allergies   Phenergan [promethazine hcl]; Statins; Trazodone and nefazodone; and Prednisone   Review of Systems Review of Systems  Constitutional: Negative for chills and fever.  HENT: Negative for sore throat.   Eyes: Negative for visual disturbance.  Respiratory: Negative for cough and shortness of breath.   Cardiovascular: Negative for chest pain.  Gastrointestinal: Negative for abdominal pain, diarrhea, nausea and vomiting.  Genitourinary: Negative for dysuria and frequency.  Musculoskeletal: Negative for back pain and neck pain.  Skin: Positive for wound. Negative for rash.  Neurological: Positive for headaches. Negative for weakness and numbness.  Hematological: Negative for adenopathy.  Psychiatric/Behavioral: Negative for behavioral problems.  All other systems reviewed and are negative.    Physical Exam Updated Vital Signs BP (!) 154/73   Pulse 87   Temp 98.4 F (36.9 C) (Oral)   Resp 17   Ht 5\' 8"  (1.727 m)   Wt 79.4  kg (175 lb)   SpO2 99%   BMI 26.61 kg/m   Physical Exam  Constitutional: She appears well-nourished. No distress.  HENT:  Head: Normocephalic.  no facial tenderness, deformity, malocclusion or hemotympanum.  no battle's sign or racoon eyes, other than the laceration to the left forehead involving the medial left brow, this is stellate and approximately 4.5 cm   Eyes: Conjunctivae and EOM are normal. Pupils are equal, round, and reactive to light. Right eye exhibits no discharge. Left eye exhibits no discharge. No scleral icterus.  There is periorbital ecchymosis on the left, normal eye exam  Neck: Normal range of motion. Neck supple.  Full range of motion, no cervical spine tenderness, no malocclusion, no trismus or torticollis  Cardiovascular: Normal rate,  regular rhythm and intact distal pulses.   Pulmonary/Chest: Effort normal and breath sounds normal. She exhibits no tenderness.  Abdominal: Soft. There is no tenderness.  Musculoskeletal: She exhibits tenderness ( slight abraision - to bilateral palms). She exhibits no edema.  Diffusely soft compartments, supple joints, range of motion of all major joints is normal, normal grips, able to straight leg raise bilaterally  No trauma to the arms or the legs, slight abrasion to the bilateral palms, no other acute injuries to the extremities the trunk or the spine  Neurological:  Speech is clear, movements are coordinated, strength is normal in all 4 extremities, cranial nerves III through XII are normal  Skin: Skin is warm and dry. No rash noted.  Laceration as noted on head exam  Nursing note and vitals reviewed.    ED Treatments / Results  Labs (all labs ordered are listed, but only abnormal results are displayed) Labs Reviewed - No data to display  Radiology Ct Head Wo Contrast  Result Date: 01/05/2017 CLINICAL DATA:  Laceration to forehead after fall. EXAM: CT HEAD WITHOUT CONTRAST CT CERVICAL SPINE WITHOUT CONTRAST TECHNIQUE:  Multidetector CT imaging of the head and cervical spine was performed following the standard protocol without intravenous contrast. Multiplanar CT image reconstructions of the cervical spine were also generated. COMPARISON:  None. FINDINGS: CT HEAD FINDINGS Brain: No subdural, epidural, or subarachnoid hemorrhage. Cerebellum, brainstem, and basal cisterns are normal. Ventricles are normal. Sulcal prominence, particularly frontally, is consistent with volume loss. No acute cortical ischemia or infarct. No mass effect or midline shift. Vascular: Calcified atherosclerosis is seen in the intracranial carotid arteries. Skull: Fractures involving the inferior wall of the left orbit, also involving the superior, anterior, and lateral walls of left maxillary sinus. Sinuses/Orbits: There is significant debris and blood products in the left maxillary sinus. There is a fracture through the inferior wall of the left orbit with a small amount of herniated fat. No entrapment of the adjacent muscle. This fracture involves the anterolateral wall of the left maxillary sinus. Other paranasal sinuses are normal. The orbits are otherwise unremarkable. Mastoid air cells and middle ears are well-aerated. No other fractures identified. Other: Soft tissue swelling in the left periorbital region and left forehead. Extracranial soft tissue are otherwise normal. CT CERVICAL SPINE FINDINGS Alignment: Normal. Skull base and vertebrae: No acute fracture. No primary bone lesion or focal pathologic process. Soft tissues and spinal canal: Calcified atherosclerotic change in the carotid arteries. Disc levels:  Multilevel degenerative changes. Upper chest: Negative. Other: No other abnormalities. IMPRESSION: 1. Fracture through the inferior wall of the left orbit with herniation of a small amount of fat but no entrapment of the inferior rectus muscle. The fracture extends into the superior, anterior, and lateral walls of left maxillary sinus. Debris  and blood is seen in the left maxillary sinus. A CT of the face could further evaluate the orbits, facial bones, and sinuses. 2. No acute intracranial abnormality. 3. No fracture or traumatic malalignment in the cervical spine. Electronically Signed   By: Dorise Bullion III M.D   On: 01/05/2017 19:36   Ct Cervical Spine Wo Contrast  Result Date: 01/05/2017 CLINICAL DATA:  Laceration to forehead after fall. EXAM: CT HEAD WITHOUT CONTRAST CT CERVICAL SPINE WITHOUT CONTRAST TECHNIQUE: Multidetector CT imaging of the head and cervical spine was performed following the standard protocol without intravenous contrast. Multiplanar CT image reconstructions of the cervical spine were also generated. COMPARISON:  None. FINDINGS: CT HEAD FINDINGS Brain: No  subdural, epidural, or subarachnoid hemorrhage. Cerebellum, brainstem, and basal cisterns are normal. Ventricles are normal. Sulcal prominence, particularly frontally, is consistent with volume loss. No acute cortical ischemia or infarct. No mass effect or midline shift. Vascular: Calcified atherosclerosis is seen in the intracranial carotid arteries. Skull: Fractures involving the inferior wall of the left orbit, also involving the superior, anterior, and lateral walls of the left maxillary sinus. Sinuses/Orbits: There is significant debris and blood products in the left maxillary sinus. There is a fracture through the inferior wall of the left orbit with a small amount of herniated fat. No entrapment of the adjacent muscle. This fracture involves the anterolateral wall of the left maxillary sinus. Other paranasal sinuses are normal. The orbits are otherwise unremarkable. Mastoid air cells and middle ears are well aerated. No other fractures identified. Other: Soft tissue swelling in the left periorbital region and left forehead. Extracranial soft tissues are otherwise normal. CT CERVICAL SPINE FINDINGS Alignment: Normal. Skull base and vertebrae: No acute fracture. No  primary bone lesion or focal pathologic process. Soft tissues and spinal canal: Calcified atherosclerotic change in the carotid arteries. Disc levels:  Multilevel degenerative changes. Upper chest: Negative. Other: No other abnormalities pay IMPRESSION: 1. Fracture through the inferior wall of the left orbit with herniation of a small amount of fat but no entrapment of the inferior rectus muscle. The fracture extends into the superior, anterior, and lateral walls of the left maxillary sinus. Debris and blood is seen in the left maxillary sinus. A CT of the face could further evaluate the orbits, facial bones, and sinuses. 2. No acute intracranial abnormality. 3. No fracture or traumatic malalignment in the cervical spine. Electronically Signed   By: Dorise Bullion III M.D   On: 01/05/2017 19:45   Ct Maxillofacial Wo Contrast  Result Date: 01/05/2017 CLINICAL DATA:  Bent over and lost balance, facial injury. EXAM: CT MAXILLOFACIAL WITHOUT CONTRAST TECHNIQUE: Multidetector CT imaging of the maxillofacial structures was performed. Multiplanar CT image reconstructions were also generated. A small metallic BB was placed on the right temple in order to reliably differentiate right from left. COMPARISON:  CT HEAD January 05, 2017 at 1858 hours FINDINGS: OSSEOUS: The mandible is intact, the condyles are located. No acute facial fracture. No destructive bony lesions. ORBITS: Acute LEFT orbital floor blowout fracture with 4 mm depressed bony fragments, external herniation of extraconal fat. Mildly enlarged LEFT inferior rectus muscle consistent with hematoma, no entrapment. Small amount of extraconal blood products LEFT orbital floor, no retrobulbar hematoma. Bilateral coloboma cyst, status post RIGHT ocular lens implant. Nondisplaced LEFT lamina papyracea fracture. SINUSES: LEFT maxillary hemosinus. Mild paranasal sinus mucosal thickening. Nasal septum is slightly deviated to the RIGHT. Included mastoid aircells are well  aerated. SOFT TISSUES: Mild LEFT periorbital and premalar soft tissue swelling without subcutaneous gas or radiopaque foreign bodies. LIMITED INTRACRANIAL: Normal. IMPRESSION: 1. Acute LEFT orbital floor blowout fracture without muscle entrapment. 2. Acute nondisplaced LEFT lamina papyracea fracture. 3. LEFT periorbital and facial swelling, no retrobulbar hematoma. 4. Bilateral colobomas. Electronically Signed   By: Elon Alas M.D.   On: 01/05/2017 21:02    Procedures Procedures (including critical care time)  Medications Ordered in ED Medications  lidocaine-EPINEPHrine (XYLOCAINE-EPINEPHrine) 1 %-1:200000 (PF) injection (not administered)  povidone-iodine (BETADINE) 10 % external solution (not administered)  bacitracin ointment 1 application (1 application Topical Given 01/05/17 1918)  Tdap (BOOSTRIX) injection 0.5 mL (0.5 mLs Intramuscular Given 01/05/17 1915)  acetaminophen (TYLENOL) tablet 1,000 mg (1,000 mg Oral Given  01/05/17 1914)  ondansetron (ZOFRAN-ODT) disintegrating tablet 4 mg (4 mg Oral Given 01/05/17 2058)     Initial Impression / Assessment and Plan / ED Course  I have reviewed the triage vital signs and the nursing notes.  Pertinent labs & imaging results that were available during my care of the patient were reviewed by me and considered in my medical decision making (see chart for details).     LACERATION REPAIR Performed by: Johnna Acosta Authorized by: Johnna Acosta Consent: Verbal consent obtained. Risks and benefits: risks, benefits and alternatives were discussed Consent given by: patient Patient identity confirmed: provided demographic data Prepped and Draped in normal sterile fashion Wound explored  Laceration Location: Forehead / brow  Laceration Length: 4.5cm  No Foreign Bodies seen or palpated  Anesthesia: local infiltration  Local anesthetic: lidocaine 1% with epinephrine  Anesthetic total: 3 ml  Irrigation method: syringe Amount of  cleaning: standard  Skin closure: 6-0 prolene  Number of sutures: 7  Technique: simple interrupted  Patient tolerance: Patient tolerated the procedure well with no immediate complications.  I removed the foreign bodies from the wound, there were 2 pieces of glass from her broken eyeglasses. There was no other dirt or other foreign body. Hemostasis was obtained, 7 sutures were placed, the patient was irrigated copiously prior to closure. Tetanus updated, pain medication given, CT scan ordered to rule out intracranial hemorrhage or skull fracture. The patient has a normal mental status and neurologic exam. The patient and family were updated of the plan and they are in agreement.  CT scan of the maxillofacial structures does confirm an orbital blowout fracture, I discussed this with Dr. Migdalia Dk who is on-call for trauma ENT, she is able to follow up with the patient is weak and recommends no blowing of the nose, ice packs, close follow-up.  The patient was given all of these instructions and expressed her understanding as well as the family members. She is stable for discharge. She has no double vision at discharge.  To go pack for hydrocodone and zofran given  Final Clinical Impressions(s) / ED Diagnoses   Final diagnoses:  Closed fracture of orbital wall, initial encounter ([place])  Laceration of forehead, initial encounter    New Prescriptions New Prescriptions   HYDROCODONE-ACETAMINOPHEN (NORCO/VICODIN) 5-325 MG TABLET    Take 2 tablets by mouth every 4 (four) hours as needed for moderate pain.   HYDROCODONE-ACETAMINOPHEN (NORCO/VICODIN) 5-325 MG TABLET    Take 2 tablets by mouth every 4 (four) hours as needed.   ONDANSETRON (ZOFRAN ODT) 4 MG DISINTEGRATING TABLET    Take 1 tablet (4 mg total) by mouth every 8 (eight) hours as needed for nausea.   ONDANSETRON (ZOFRAN ODT) 4 MG DISINTEGRATING TABLET    Take 1 tablet (4 mg total) by mouth every 8 (eight) hours as needed for nausea.       Noemi Chapel, MD 01/05/17 2128

## 2017-01-05 NOTE — ED Triage Notes (Signed)
Pt was bending over to tie her shoe and she lost her balance. She fell forward, hitting her head on the gravel. She has a deep laceration to her left forehead. Bleeding is controlled at this time. Denies any blood thinner use. Pt is alert and oriented. Denies any LOC.

## 2017-01-05 NOTE — ED Notes (Signed)
Pt in CT.

## 2017-01-05 NOTE — Discharge Instructions (Signed)
Please keep an ice pack on your eye as much as possible, rapid in a towel first.  Please do not blow your nose as this will make the swelling much worse  Please take the medications as follows for pain  #1 hydrocodone one tablet every 8 hours as needed  #2 Zofran 1 tablet every 8 hours as needed for nausea  If you are taking hydrocodone please take a stool softener as well as you may become constipated with this medication. You should also not drive and try to avoid much walking when you're taking strong pain medication such as hydrocodone.

## 2017-01-05 NOTE — ED Notes (Signed)
Patient transported to CT 

## 2017-01-05 NOTE — ED Notes (Addendum)
Pt returned from CT-  CT tech reports pt vomited while in CT Dr Oleta Mouse informed.

## 2017-01-06 MED FILL — Ondansetron HCl Tab 4 MG: ORAL | Qty: 4 | Status: AC

## 2017-01-06 MED FILL — Hydrocodone-Acetaminophen Tab 5-325 MG: ORAL | Qty: 6 | Status: AC

## 2017-01-07 ENCOUNTER — Emergency Department (HOSPITAL_COMMUNITY)
Admission: EM | Admit: 2017-01-07 | Discharge: 2017-01-08 | Disposition: A | Discharge status: Home / Self Care | Payer: Medicare Other | Attending: Emergency Medicine | Admitting: Emergency Medicine

## 2017-01-07 ENCOUNTER — Encounter (HOSPITAL_COMMUNITY): Payer: Self-pay | Admitting: *Deleted

## 2017-01-07 DIAGNOSIS — Z79899 Other long term (current) drug therapy: Secondary | ICD-10-CM | POA: Diagnosis not present

## 2017-01-07 DIAGNOSIS — S0990XD Unspecified injury of head, subsequent encounter: Secondary | ICD-10-CM | POA: Insufficient documentation

## 2017-01-07 DIAGNOSIS — S0990XA Unspecified injury of head, initial encounter: Secondary | ICD-10-CM | POA: Diagnosis not present

## 2017-01-07 DIAGNOSIS — R202 Paresthesia of skin: Secondary | ICD-10-CM | POA: Diagnosis not present

## 2017-01-07 DIAGNOSIS — I10 Essential (primary) hypertension: Secondary | ICD-10-CM | POA: Diagnosis not present

## 2017-01-07 DIAGNOSIS — Z7984 Long term (current) use of oral hypoglycemic drugs: Secondary | ICD-10-CM | POA: Diagnosis not present

## 2017-01-07 DIAGNOSIS — E119 Type 2 diabetes mellitus without complications: Secondary | ICD-10-CM | POA: Diagnosis not present

## 2017-01-07 DIAGNOSIS — Z7982 Long term (current) use of aspirin: Secondary | ICD-10-CM | POA: Diagnosis not present

## 2017-01-07 DIAGNOSIS — R200 Anesthesia of skin: Secondary | ICD-10-CM | POA: Diagnosis not present

## 2017-01-07 DIAGNOSIS — W19XXXD Unspecified fall, subsequent encounter: Secondary | ICD-10-CM | POA: Diagnosis not present

## 2017-01-07 NOTE — ED Provider Notes (Signed)
[name] DEPT Provider Note   CSN: [id number] Arrival date & time: 01/07/17  2130     History   Chief Complaint Chief Complaint  Patient presents with  . Fall    HPI [name] is a 83 y.o. female.  HPI  This is a very pleasant 83 year old female who presents with concerns for numbness over the left side of her scalp. Patient was seen and evaluated 2 days ago for a fall. At that time she was noted to have an orbital fracture. Head CT was negative. She is only on daily aspirin. She states that since Sunday she has noted a patch of numbness just above the left for head it has not progressed or changed over the last 2 days. She denies any headache or pain. She states that last night she had 2 episodes of vertigo. She has a history of vertigo. She took meclizine and this resolved. She denies any speech disturbance, vision changes, weakness, numbness, tingling or strokelike symptoms. Patient states that she is concerned that the numbness did not go away. She called the triage nurse who recommended she be evaluated. She states that she has been more ambulatory today and otherwise has had improved ADLs today following her fall.  Past Medical History:  Diagnosis Date  . Diabetes mellitus without complication ([place])   . Hypertension     Patient Active Problem List   Diagnosis Date Noted  . Hyperlipidemia associated with type 2 diabetes mellitus ([place]) 10/02/2016  . Pedal edema 10/02/2016  . Insomnia 10/02/2016  . SBO (small bowel obstruction) ([place]) 07/02/2016  . Dyspnea on exertion 02/24/2016  . Other fatigue   . E-coli UTI 06/18/2015  . Partial small bowel obstruction ([place]) 06/15/2015  . Ileitis, regional, with intestinal obstruction 06/15/2015  . Leukocytosis 12/16/2014  . Nausea and vomiting 12/15/2014  . HLD (hyperlipidemia) 12/15/2014  . Essential hypertension 12/15/2014  . Controlled type 2 diabetes mellitus without complication, without long-term current use of  insulin ([place]) 02/15/2011    Past Surgical History:  Procedure Laterality Date  . ABDOMINAL HYSTERECTOMY    . APPENDECTOMY    . BREAST REDUCTION SURGERY    . CARDIAC CATHETERIZATION    . COLON SURGERY Left    Hemicolectomy due to diverticulitis  . EYE SURGERY  03/30/2009   cataract  . KNEE SURGERY    . LAPAROSCOPIC INCISIONAL / UMBILICAL / VENTRAL HERNIA REPAIR  02/23/2007   . REFRACTIVE SURGERY      OB History    No data available       Home Medications    Prior to Admission medications   Medication Sig Start Date End Date Taking? Authorizing Provider  acetaminophen (TYLENOL) 500 MG tablet Take 500-1,000 mg by mouth every 6 (six) hours as needed for mild pain or moderate pain.   Yes [provider]  amLODipine (NORVASC) 10 MG tablet TAKE ONE TABLET BY MOUTH ONCE DAILY 09/30/16  Yes Kathyrn Drown, MD  aspirin 81 MG tablet Take 81 mg by mouth daily.     Yes [provider]  beta carotene w/minerals (OCUVITE) tablet Take 1 tablet by mouth daily.   Yes [provider]  cholecalciferol (VITAMIN D) 1000 units tablet Take 1,000 Units by mouth daily.   Yes [provider]  cyanocobalamin 2000 MCG tablet Take 2,000 mcg by mouth daily.   Yes [provider]  hydrALAZINE (APRESOLINE) 25 MG tablet 1 q8 hours if systolic greater than 765 Patient taking differently: Take 25  mg by mouth every 8 (eight) hours as needed. 1 q8 hours if systolic greater than 725 03/10/16  Yes Luking, Elayne Snare, MD  HYDROcodone-acetaminophen (NORCO/VICODIN) 5-325 MG tablet Take 2 tablets by mouth every 4 (four) hours as needed for moderate pain. 01/05/17  Yes Noemi Chapel, MD  indapamide (LOZOL) 2.5 MG tablet Take 1 tablet (2.5 mg total) by mouth daily. 07/22/16  Yes Luking, Elayne Snare, MD  LORazepam (ATIVAN) 0.5 MG tablet TAKE 1 TABLET BY MOUTH AT BEDTIME AS NEEDED FOR ANXIETY 11/14/16  Yes Luking, Elayne Snare, MD  losartan (COZAAR) 100 MG tablet TAKE 1 TABLET BY MOUTH ONCE DAILY  (STOP LOSARTAN/HCTZ) Patient taking differently: TAKE 1 TABLET BY MOUTH ONCE DAILY 12/13/16  Yes Kathyrn Drown, MD  meclizine (ANTIVERT) 25 MG tablet Take 25 mg by mouth 3 (three) times daily as needed for dizziness or nausea.   Yes [provider]  metFORMIN (GLUCOPHAGE) 1000 MG tablet TAKE ONE TABLET BY MOUTH TWICE DAILY 09/30/16  Yes Luking, Scott A, MD  ondansetron (ZOFRAN ODT) 4 MG disintegrating tablet Take 1 tablet (4 mg total) by mouth every 8 (eight) hours as needed for nausea. 01/05/17  Yes Noemi Chapel, MD  polyvinyl alcohol (LIQUIFILM TEARS) 1.4 % ophthalmic solution Place 1 drop into both eyes 3 (three) times daily.    Yes [provider]    Family History Family History  Problem Relation Age of Onset  . Hypertension Father        kidney  . Stroke Father   . Cancer Father   . Cancer Mother        ovaian  . Diabetes Brother   . Hypertension Sister     Social History Social History  Substance Use Topics  . Smoking status: Never Smoker  . Smokeless tobacco: Never Used  . Alcohol use No     Allergies   Phenergan [promethazine hcl]; Statins; Trazodone and nefazodone; and Prednisone   Review of Systems Review of Systems  Eyes: Negative for visual disturbance.  Respiratory: Negative for shortness of breath.   Cardiovascular: Negative for chest pain.  Gastrointestinal: Negative for abdominal pain, nausea and vomiting.  Neurological: Positive for dizziness and numbness. Negative for speech difficulty, weakness and headaches.  All other systems reviewed and are negative.    Physical Exam Updated Vital Signs BP (!) 123/51 (BP Location: Right Arm)   Pulse 75   Temp 98.3 F (36.8 C) (Oral)   Resp 17   Ht 5\' 8"  (1.727 m)   Wt 79.4 kg (175 lb)   SpO2 96%   BMI 26.61 kg/m   Physical Exam  Constitutional: She is oriented to person, place, and time. She appears well-developed and well-nourished. No distress.  HENT:  Head: Normocephalic.    Extensive bruising noted over the left eye, minimal swelling, bruising also noted over the left chin, well-healing laceration just above the left eyebrow without adjacent erythema, sutures intact  Eyes: EOM are normal. Pupils are equal, round, and reactive to light.  Pupils 3 mm reactive bilaterally  Neck: Neck supple.  Cardiovascular: Normal rate, regular rhythm and normal heart sounds.   Pulmonary/Chest: Effort normal and breath sounds normal. No respiratory distress. She has no wheezes.  Abdominal: Soft. There is no tenderness.  Neurological: She is alert and oriented to person, place, and time.  Cranial nerves II through XII intact, 5 out of 5 strength in all 4 extremity, no dysmetria to finger-nose-finger, subjective decreased sensation left forehead and scalp  Skin:  Skin is warm and dry.  Psychiatric: She has a normal mood and affect.  Nursing note and vitals reviewed.    ED Treatments / Results  Labs (all labs ordered are listed, but only abnormal results are displayed) Labs Reviewed - No data to display  EKG  EKG Interpretation None       Radiology No results found.  Procedures Procedures (including critical care time)  Medications Ordered in ED Medications - No data to display   Initial Impression / Assessment and Plan / ED Course  I have reviewed the triage vital signs and the nursing notes.  Pertinent labs & imaging results that were available during my care of the patient were reviewed by me and considered in my medical decision making (see chart for details).    Patient presents with numbness persistent following a fall 2 days ago. She is concerned that it has not gone away. She otherwise has improved regarding her fall. No other neurologic deficits. She is neurologically intact. No new injury. She is very low risk for any rebleed she is not on any anticoagulants.  I had a long discussion with the patient and her family. Given that this is sensory, she may  have damaged nerve and the fall. I have low suspicion for rebleed. No indication at this time for imaging. However, I have offered this to the family and the patient for reassurance. They are okay without reimaging. She will need to have her suture removed by the end of the week and will need reassessment at that time. I have suggested monitoring for signs and symptoms of progression. If she has any progressive numbness, new headache, or any strokelike symptoms, she should be reevaluated immediately.  After history, exam, and medical workup I feel the patient has been appropriately medically screened and is safe for discharge home. Pertinent diagnoses were discussed with the patient. Patient was given return precautions.   Final Clinical Impressions(s) / ED Diagnoses   Final diagnoses:  Injury of head, initial encounter  Numbness    New Prescriptions New Prescriptions   No medications on file     Merryl Hacker, MD 01/08/17 [phone]

## 2017-01-07 NOTE — ED Triage Notes (Signed)
Pt states that she fell two days ago, has had numbness noted to top of head area above left eye that started after the fall, pt also c/o episode of dizziness that occurred last night but went away after taking two doses of Antivert. Denies any dizziness today.

## 2017-01-07 NOTE — Discharge Instructions (Signed)
You were seen today for some numbness following a head injury two days ago.  Your CT was negative at that time for head bleed.  You are otherwise low risk for rebleed and your exam is reassuring. Monitor for signs and symptoms of worsening numbness, strokelike symptoms or headache. If you develop any of these units. Reassess. Follow-up with primary physician for suture removal and reassessment.

## 2017-01-08 ENCOUNTER — Ambulatory Visit: Payer: Medicare Other | Admitting: Family Medicine

## 2017-01-10 ENCOUNTER — Encounter: Payer: Self-pay | Admitting: Family Medicine

## 2017-01-10 ENCOUNTER — Ambulatory Visit (INDEPENDENT_AMBULATORY_CARE_PROVIDER_SITE_OTHER): Payer: Medicare Other | Admitting: Family Medicine

## 2017-01-10 ENCOUNTER — Other Ambulatory Visit: Payer: Self-pay | Admitting: Family Medicine

## 2017-01-10 VITALS — BP 124/80 | Ht 68.0 in | Wt 170.0 lb

## 2017-01-10 DIAGNOSIS — R200 Anesthesia of skin: Secondary | ICD-10-CM | POA: Diagnosis not present

## 2017-01-10 DIAGNOSIS — R202 Paresthesia of skin: Secondary | ICD-10-CM | POA: Diagnosis not present

## 2017-01-10 DIAGNOSIS — S0003XD Contusion of scalp, subsequent encounter: Secondary | ICD-10-CM

## 2017-01-10 DIAGNOSIS — W0110XA Fall on same level from slipping, tripping and stumbling with subsequent striking against unspecified object, initial encounter: Secondary | ICD-10-CM

## 2017-01-10 DIAGNOSIS — R29898 Other symptoms and signs involving the musculoskeletal system: Secondary | ICD-10-CM

## 2017-01-10 NOTE — Progress Notes (Signed)
   Subjective:    Patient ID: [name], female    DOB: 12/03/1931, 83 y.o.   MRN: [id number] Patient arrives office for follow-up from hospitalization. HPISuture removal. Bent over to tie shoes and fell over. Went to ED on 7/8 and 7/10.   Three times since fall pt's left arm has been giving out. She also got tetanus in left arm at the hospital. On further history both shoulders painful at times. At times when she goes to use her left arm/shoulder pain and it feels weak.  Patient did have some numbness in the anterior frontal scalp. On the left side. This is where the injury occurred around her orbit along with the laceration.  Scan also revealed orbital fracture patient followed ENT soon.  Still having residual mild headaches but not severe. X  No confusion no nausea good appetite for emergency room report and all skin uterine presence of patient.   Went to the e r Sunday     Review of Systems No headache, no major weight loss or weight gain, no chest pain no back pain abdominal pain no change in bowel habits complete ROS otherwise negative     Objective:   Physical Exam  Alert and oriented, vitals reviewed and stable, NAD ENT-TM's and ext canals WNL bilat via otoscopic exam Soft palate, tonsils and post pharynx WNL via oropharyngeal exam Neck-symmetric, no masses; thyroid nonpalpable and nontender Pulmonary-no tachypnea or accessory muscle use; Clear without wheezes via auscultation Card--no abnrml murmurs, rhythm reg and rate WNL Carotid pulses symmetric, without bruits Left arm strength intact good range of motion some impingement sign left shoulder left for head sutures removed. Sensation grossly intact above eye brow. Some swelling noted      Assessment & Plan:  Impression 1 left arm "weakness" I think really this is a reluctance for her arm to move secondary to pain difference discussed to left scalp numbness either neuropraxia from contusion or laceration  discussed expect gradual improvement 3 wound care discussed #4 orbital fracture concerns discussed  Greater than 50% of this 25 minute face to face visit was spent in counseling and discussion and coordination of care regarding the above diagnosis/diagnosies

## 2017-01-13 ENCOUNTER — Telehealth: Payer: Self-pay | Admitting: *Deleted

## 2017-01-13 NOTE — Telephone Encounter (Signed)
Patient is aware 

## 2017-01-13 NOTE — Telephone Encounter (Signed)
Likely not a werious issue, likely the way she held the leash along with inflammation in the shoulder, no need for further w u at this time unless cont to recur, then sched an o v

## 2017-01-13 NOTE — Telephone Encounter (Signed)
Patient called stating she was seen Friday, and today she took the dog out and her left hand fingers went numb for about 3 minutes. Patient is concerned please advise [phone]. Per pt she has not had any numbness before.

## 2017-01-17 DIAGNOSIS — S0232XA Fracture of orbital floor, left side, initial encounter for closed fracture: Secondary | ICD-10-CM | POA: Diagnosis not present

## 2017-01-17 DIAGNOSIS — S0232XD Fracture of orbital floor, left side, subsequent encounter for fracture with routine healing: Secondary | ICD-10-CM | POA: Insufficient documentation

## 2017-01-21 ENCOUNTER — Ambulatory Visit (INDEPENDENT_AMBULATORY_CARE_PROVIDER_SITE_OTHER): Payer: Medicare Other | Admitting: Family Medicine

## 2017-01-21 ENCOUNTER — Encounter: Payer: Self-pay | Admitting: Family Medicine

## 2017-01-21 VITALS — BP 134/80 | Ht 68.0 in | Wt 171.0 lb

## 2017-01-21 DIAGNOSIS — G4700 Insomnia, unspecified: Secondary | ICD-10-CM

## 2017-01-21 DIAGNOSIS — E785 Hyperlipidemia, unspecified: Secondary | ICD-10-CM

## 2017-01-21 DIAGNOSIS — M25512 Pain in left shoulder: Secondary | ICD-10-CM

## 2017-01-21 DIAGNOSIS — E119 Type 2 diabetes mellitus without complications: Secondary | ICD-10-CM | POA: Diagnosis not present

## 2017-01-21 DIAGNOSIS — E1169 Type 2 diabetes mellitus with other specified complication: Secondary | ICD-10-CM

## 2017-01-21 DIAGNOSIS — I10 Essential (primary) hypertension: Secondary | ICD-10-CM | POA: Diagnosis not present

## 2017-01-21 LAB — POCT GLYCOSYLATED HEMOGLOBIN (HGB A1C): HEMOGLOBIN A1C: 5.8

## 2017-01-21 MED ORDER — ALPRAZOLAM 0.25 MG PO TABS: 0.2500 mg | ORAL_TABLET | Freq: Every evening | ORAL | 2 refills | Status: DC | PRN

## 2017-01-21 NOTE — Progress Notes (Signed)
   Subjective:    Patient ID: [name], female    DOB: 1932-04-18, 83 y.o.   MRN: [id number]  HPIleft shoulder pain since fall. Left arm weakness. Left fingers went numb one day. Using ice.   patient was walking outsde on gvel she bent over and she fell Pt states she called before appt to get blo[place]ork orders and was told all she needed was a1c. Pt requesting a1c today. Pt states she sees Dr. Caprice Beaver for diabetic foot exam. She will see him next week. Saw eye doctor last Friday.   Patient does take her blood pressuremedicine o a regular basis  she also watches her sugars in her diet stays active  she does take her metformin and tolerates it we  statesAtivan is not helping her sleep whereas Xanax did A1C today 5.8.   Pt wants to change ativan to xanax.   Pt would like to get a 4 prong cane until she builds her confidence back up. Since fall she is worried about falling again.   Review of Systems  Constitutional: Negative for activity change, appetite change and fatigue.  HENT: Negative for congestion.   Respiratory: Negative for cough.   Cardiovascular: Negative for chest pain.  Gastrointestinal: Negative for abdominal pain.  Endocrine: Negative for polydipsia and polyphagia.  Neurological: Negative for weakness.  Psychiatric/Behavioral: Negative for confusion.       Objective:   Physical Exam  Constitutional: She appears well-nourished. No distress.  Cardiovascular: Normal rate, regular rhythm and normal heart sounds.   No murmur heard. Pulmonary/Chest: Effort normal and breath sounds normal. No respiratory distress.  Musculoskeletal: She exhibits no edema.  Lymphadenopathy:    She has no cervical adenopathy.  Neurological: She is alert. She exhibits normal muscle tone.  Psychiatric: Her behavior is normal.  Vitals reviewed.  Left shoulder good range of motion good strength  Patient followed by podiatry     Assessment & Plan:  Shoulder exercises shown if not  doing better over the next 3-4 weeks notify us we will help set up physical therapy, physical therapy not necessary currently  Diabetes good control A1c looks good continue current measures  Recent lab work reviewed Blood pressure good control Hyperlipidemia under good control recent lab work reviewed Insomnia Ativan no longer helping go back to Xanax low-dose half tablet daily at bedtime when necessary Recent fall importance of balance exercises plus also patient was encouraged to use a cane when necessary

## 2017-01-23 ENCOUNTER — Telehealth: Payer: Self-pay | Admitting: Family Medicine

## 2017-01-23 DIAGNOSIS — R040 Epistaxis: Secondary | ICD-10-CM | POA: Diagnosis not present

## 2017-01-23 NOTE — Telephone Encounter (Incomplete)
Spoke with patient and patient stated that she has been using a cold rag for her nose and it stops bleeding. Bleeding occurs when she stands up. Advised her per Dr.Scott to squeeze both nostrils and hold the 5 minutes. Also use saline nasal spray to keep nasal passages moist. Stop taking aspirin 81 mg for 5 days until bleeding stops and Dr.Scott would like to order a CBC, If continues we may need to refer to ENT. If bleeding worsens go to the ER. Patient verbalized understanding.

## 2017-01-23 NOTE — Telephone Encounter (Signed)
Nurse's-please call patient. Please discuss the nature of her nosebleeds and how she is getting it to stop (typically what I recommend is squeezing the nose from both sides for 5 minutes in order to stop bleeding) other measures that can assist is using saline nasal spray twice a day to keep the inside of the nose moist. I also recommend that she stop her 81 mg aspirin for the next 5 days then may restart if nosebleeds have stopped. In addition to this I recommend a CBC. If she has ongoing troubles we will need to see her with possible referral to ENT. Finally the patient has severe bleeding she may need to go to the ER

## 2017-01-23 NOTE — Telephone Encounter (Signed)
[facility] [facility] 01/05/17, seen at ER, & seen here for follow up Pt states no trouble at all until this morning   Twice this morning upon standing pt's nose is bleeding.  Will subside with pressure for about an hour.  But when she stands up it starts bleeding again.  Not gushing but enough to mess up a tissue  Please advise

## 2017-01-24 ENCOUNTER — Encounter: Payer: Self-pay | Admitting: Family Medicine

## 2017-01-24 LAB — CBC WITH DIFFERENTIAL/PLATELET
BASOS ABS: 0 10*3/uL (ref 0.0–0.2)
Basos: 0 %
EOS (ABSOLUTE): 0.2 10*3/uL (ref 0.0–0.4)
Eos: 2 %
HEMOGLOBIN: 12.2 g/dL (ref 11.1–15.9)
Hematocrit: 37.7 % (ref 34.0–46.6)
Immature Grans (Abs): 0 10*3/uL (ref 0.0–0.1)
Immature Granulocytes: 0 %
LYMPHS ABS: 1.9 10*3/uL (ref 0.7–3.1)
LYMPHS: 18 %
MCH: 28.2 pg (ref 26.6–33.0)
MCHC: 32.4 g/dL (ref 31.5–35.7)
MCV: 87 fL (ref 79–97)
MONOCYTES: 7 %
Monocytes Absolute: 0.7 10*3/uL (ref 0.1–0.9)
NEUTROS ABS: 7.6 10*3/uL — AB (ref 1.4–7.0)
Neutrophils: 73 %
PLATELETS: 303 10*3/uL (ref 150–379)
RBC: 4.32 x10E6/uL (ref 3.77–5.28)
RDW: 15.8 % — ABNORMAL HIGH (ref 12.3–15.4)
WBC: 10.4 10*3/uL (ref 3.4–10.8)

## 2017-01-27 ENCOUNTER — Ambulatory Visit: Payer: Medicare Other | Admitting: Family Medicine

## 2017-01-28 DIAGNOSIS — B351 Tinea unguium: Secondary | ICD-10-CM | POA: Diagnosis not present

## 2017-01-30 ENCOUNTER — Ambulatory Visit: Payer: Medicare Other | Admitting: Family Medicine

## 2017-01-31 NOTE — Progress Notes (Signed)
Cardiology Office Note   Date:  02/03/2017   ID:  [name], [name] 04-Apr-1932, MRN [id number]  PCP:  Kathyrn Drown, MD  Cardiologist:   Jenkins Rouge, MD   No chief complaint on file.     History of Present Illness: [name] is a 83 y.o. female who presents for evaluation of tachycardia dyspnea and fatigue First seen by me August 2017 Seen in ER  02/25/16  She has history of HTN and DM  Symptoms for about 2 weeks Given lasix by primary In ER Hct ok  BNP only 51 CXR with mild scarring mild hyperinflation NAD  Troponin negative   Echo 02/25/16 reviewed : EF normal 55-60%  Small pericardial effusion    Had headache  with elevated BP and palpitations. Episode lasted about 30 minutes better now but "strength sapped out of her" Dr Wolfgang Phoenix gave her hydralazine to take if she feels this way and BP up She has not used yet   07/08/16 Admitted [facility] with SBO History of hysterectomy and colectomy  Rx conservatively with NG tube and bowel rest No need for surgery   [place] a few weeks ago and needed stitches over left eye Has f/u with ENT   Past Medical History:  Diagnosis Date  . Diabetes mellitus without complication ([place])   . Hypertension     Past Surgical History:  Procedure Laterality Date  . ABDOMINAL HYSTERECTOMY    . APPENDECTOMY    . BREAST REDUCTION SURGERY    . CARDIAC CATHETERIZATION    . COLON SURGERY Left    Hemicolectomy due to diverticulitis  . EYE SURGERY  03/30/2009   cataract  . KNEE SURGERY    . LAPAROSCOPIC INCISIONAL / UMBILICAL / VENTRAL HERNIA REPAIR  02/23/2007   . REFRACTIVE SURGERY       Current Outpatient Prescriptions  Medication Sig Dispense Refill  . acetaminophen (TYLENOL) 500 MG tablet Take 500-1,000 mg by mouth every 6 (six) hours as needed for mild pain or moderate pain.    Marland Kitchen ALPRAZolam (XANAX) 0.25 MG tablet Take 1 tablet (0.25 mg total) by mouth at bedtime as needed for anxiety. 30 tablet 2  . amLODipine (NORVASC) 10 MG tablet TAKE  ONE TABLET BY MOUTH ONCE DAILY 90 tablet 1  . aspirin 81 MG tablet Take 81 mg by mouth daily.      . beta carotene w/minerals (OCUVITE) tablet Take 1 tablet by mouth daily.    . cholecalciferol (VITAMIN D) 1000 units tablet Take 1,000 Units by mouth daily.    . cyanocobalamin 2000 MCG tablet Take 2,000 mcg by mouth daily.    . indapamide (LOZOL) 2.5 MG tablet TAKE ONE TABLET BY MOUTH ONCE DAILY -- **STOP LOSARTAN/HCTZ AND CONTINUE AMLODIPINE** 90 tablet 0  . losartan (COZAAR) 100 MG tablet TAKE 1 TABLET BY MOUTH ONCE DAILY (STOP LOSARTAN/HCTZ) (Patient taking differently: TAKE 1 TABLET BY MOUTH ONCE DAILY) 90 tablet 1  . meclizine (ANTIVERT) 25 MG tablet Take 25 mg by mouth 3 (three) times daily as needed for dizziness or nausea.    . metFORMIN (GLUCOPHAGE) 1000 MG tablet TAKE ONE TABLET BY MOUTH TWICE DAILY 180 tablet 1  . Omega-3 Fatty Acids (FISH OIL) 1000 MG CAPS Take 1 capsule by mouth daily.    . ondansetron (ZOFRAN ODT) 4 MG disintegrating tablet Take 1 tablet (4 mg total) by mouth every 8 (eight) hours as needed for nausea. 4 tablet 0  . polyvinyl alcohol (LIQUIFILM TEARS) 1.4 %  ophthalmic solution Place 1 drop into both eyes 3 (three) times daily.     . hydrALAZINE (APRESOLINE) 25 MG tablet 1 q8 hours if systolic greater than 093 (Patient not taking: Reported on 02/03/2017) 30 tablet 1   No current facility-administered medications for this visit.     Allergies:   Phenergan [promethazine hcl]; Statins; Trazodone and nefazodone; and Prednisone    Social History:  The patient  reports that she has never smoked. She has never used smokeless tobacco. She reports that she does not drink alcohol or use drugs.   Family History:  The patient's family history includes Cancer in her father and mother; Diabetes in her brother; Hypertension in her father and sister; Stroke in her father.    ROS:  Please see the history of present illness.   Otherwise, review of systems are positive for none.    All other systems are reviewed and negative.    PHYSICAL EXAM: VS:  BP (!) 144/52   Pulse 72   Ht 5\' 8"  (1.727 m)   Wt 166 lb 6.4 oz (75.5 kg)   SpO2 96%   BMI 25.30 kg/m  , BMI Body mass index is 25.3 kg/m. Affect appropriate Healthy:  appears stated age 53: healed scar over left eye brow  Neck supple with no adenopathy JVP normal no bruits no thyromegaly Lungs clear with no wheezing and good diaphragmatic motion Heart:  S1/S2 no murmur, no rub, gallop or click PMI normal Abdomen: benighn, BS positve, no tenderness, no AAA mid line incision below umilicus  no bruit.  No HSM or HJR Multiple previous abdominal surgery appy and colectomy  Distal pulses intact with no bruits No edema Neuro non-focal Skin warm and dry No muscular weakness     EKG:   02/27/16  SR lack of R waves precordium ? Old ASMI  05/15/16     Recent Labs: 02/24/2016: B Natriuretic Peptide 24.9 03/11/2016: TSH 1.650 07/02/2016: Magnesium 1.5 10/25/2016: ALT 16; BUN 22; Creatinine, Ser 0.97; Potassium 4.0; Sodium 136 01/23/2017: Hemoglobin 12.2; Platelets 303    Lipid Panel    Component Value Date/Time   CHOL 171 09/27/2016 0927   TRIG 180 (H) 09/27/2016 0927   HDL 41 09/27/2016 0927   CHOLHDL 4.2 09/27/2016 0927   CHOLHDL 3.3 07/05/2014 0907   VLDL 22 07/05/2014 0907   LDLCALC 94 09/27/2016 0927      Wt Readings from Last 3 Encounters:  02/03/17 166 lb 6.4 oz (75.5 kg)  01/21/17 171 lb (77.6 kg)  01/10/17 170 lb (77.1 kg)      Other studies Reviewed: Additional studies/ records that were reviewed today include: ER notes, ECG, CXR Notes Dr Wolfgang Phoenix .    ASSESSMENT AND PLAN:  1. Tachycardia:  Recurrent episode one since August not frequent enough for event monitor will call If recurs  2. Dyspnea:  Resolved BNP normal CXR ok doubt PE ? Viral syndrome with pleuropericarditis Echo with normal EF and no valve disease  3. HTN;  Well controlled.  Continue current medications and low sodium  Dash type diet.  PRN hydralazine For spikes per Dr Wolfgang Phoenix  4. Pericardial effusion : small last echo 02/25/16 exam with no pulsus/ or JVP elevation no need for f/u echo at this time 5. DM:  Discussed low carb diet.  Target hemoglobin A1c is 6.5 or less.  Continue current medications. 6. SBO: f/u GI related to previous abdominal surgery she indicates if recurs and needs surgery wants Dr Lucia Gaskins to do  7. Trauma:  F/u ENT eyebrow has healed well no nasal d/c had acute left orbital floor blowout    Current medicines are reviewed at length with the patient today.  The patient does not have concerns regarding medicines.  The following changes have been made:  no change  Labs/ tests ordered today include: None   No orders of the defined types were placed in this encounter.    Disposition:   FU with me in a year     Signed, Jenkins Rouge, MD  02/03/2017 1:13 PM    [facility] [address], [place], [place]  [address] Phone: [phone]; Fax: [phone]

## 2017-02-03 ENCOUNTER — Ambulatory Visit (INDEPENDENT_AMBULATORY_CARE_PROVIDER_SITE_OTHER): Payer: Medicare Other | Admitting: Cardiovascular Disease

## 2017-02-03 ENCOUNTER — Encounter: Payer: Self-pay | Admitting: Cardiovascular Disease

## 2017-02-03 VITALS — BP 144/52 | HR 72 | Ht 68.0 in | Wt 166.4 lb

## 2017-02-03 DIAGNOSIS — R000 Tachycardia, unspecified: Secondary | ICD-10-CM | POA: Diagnosis not present

## 2017-02-03 NOTE — Patient Instructions (Signed)

## 2017-02-07 DIAGNOSIS — S0232XD Fracture of orbital floor, left side, subsequent encounter for fracture with routine healing: Secondary | ICD-10-CM | POA: Diagnosis not present

## 2017-02-25 ENCOUNTER — Telehealth: Payer: Self-pay | Admitting: Family Medicine

## 2017-02-25 NOTE — Telephone Encounter (Signed)
Patient advised Shingrix is a new  shingles shot. Dr Nicki Reaper does recommend it. It is a series of 2 vaccines given 1 followed 2 months later by second one typically done through the drugstore we can print her a prescription for it. Patient verbalized understanding.

## 2017-02-25 NOTE — Telephone Encounter (Signed)
Patient wants to know if the new shingles shot is recommended for her?

## 2017-02-25 NOTE — Telephone Encounter (Signed)
Shingrix is a new  shingles shot. I recommend it. It is a series of 2 vaccines given 1 followed 2 months later by second one typically done through the drugstore we can print her a prescription for it

## 2017-03-27 DIAGNOSIS — M1711 Unilateral primary osteoarthritis, right knee: Secondary | ICD-10-CM | POA: Diagnosis not present

## 2017-03-28 ENCOUNTER — Other Ambulatory Visit: Payer: Self-pay | Admitting: Family Medicine

## 2017-03-29 ENCOUNTER — Other Ambulatory Visit: Payer: Self-pay | Admitting: Family Medicine

## 2017-04-02 DIAGNOSIS — Z23 Encounter for immunization: Secondary | ICD-10-CM | POA: Diagnosis not present

## 2017-04-03 DIAGNOSIS — M1711 Unilateral primary osteoarthritis, right knee: Secondary | ICD-10-CM | POA: Diagnosis not present

## 2017-04-11 ENCOUNTER — Telehealth: Payer: Self-pay | Admitting: Family Medicine

## 2017-04-11 DIAGNOSIS — M1711 Unilateral primary osteoarthritis, right knee: Secondary | ICD-10-CM | POA: Diagnosis not present

## 2017-04-11 NOTE — Telephone Encounter (Signed)
I received orders regarding testing for glucose strips. It should be noted that the patient is not on insulin she'll need needs to check her sugars once daily therefore the orders reflected this thank [facility]

## 2017-04-21 ENCOUNTER — Other Ambulatory Visit: Payer: Self-pay | Admitting: Family Medicine

## 2017-04-29 DIAGNOSIS — B351 Tinea unguium: Secondary | ICD-10-CM | POA: Diagnosis not present

## 2017-05-06 ENCOUNTER — Telehealth: Payer: Self-pay | Admitting: Family Medicine

## 2017-05-06 DIAGNOSIS — E119 Type 2 diabetes mellitus without complications: Secondary | ICD-10-CM

## 2017-05-06 DIAGNOSIS — Z79899 Other long term (current) drug therapy: Secondary | ICD-10-CM

## 2017-05-06 DIAGNOSIS — E785 Hyperlipidemia, unspecified: Secondary | ICD-10-CM

## 2017-05-06 DIAGNOSIS — I10 Essential (primary) hypertension: Secondary | ICD-10-CM

## 2017-05-06 NOTE — Telephone Encounter (Signed)
Patient last had labs 10/25/2016 BMET,CBC,HEPATIC FX PANEL. Please advise.

## 2017-05-06 NOTE — Telephone Encounter (Signed)
Requesting orders for labs.  She has an appointment on 05/13/17 with Dr. Nicki Reaper.

## 2017-05-06 NOTE — Telephone Encounter (Signed)
Metabolic 7, lipid, liver, A1c-diabetes hypertension, hyperlipidemia

## 2017-05-07 NOTE — Telephone Encounter (Signed)
Patient is aware she may have her labs drawn prior to appointment.

## 2017-05-08 DIAGNOSIS — E119 Type 2 diabetes mellitus without complications: Secondary | ICD-10-CM | POA: Diagnosis not present

## 2017-05-08 DIAGNOSIS — E785 Hyperlipidemia, unspecified: Secondary | ICD-10-CM | POA: Diagnosis not present

## 2017-05-08 DIAGNOSIS — Z79899 Other long term (current) drug therapy: Secondary | ICD-10-CM | POA: Diagnosis not present

## 2017-05-08 DIAGNOSIS — I10 Essential (primary) hypertension: Secondary | ICD-10-CM | POA: Diagnosis not present

## 2017-05-09 LAB — HEPATIC FUNCTION PANEL
ALBUMIN: 4.4 g/dL (ref 3.5–4.7)
ALT: 11 IU/L (ref 0–32)
AST: 15 IU/L (ref 0–40)
Alkaline Phosphatase: 78 IU/L (ref 39–117)
Bilirubin Total: 0.5 mg/dL (ref 0.0–1.2)
Bilirubin, Direct: 0.1 mg/dL (ref 0.00–0.40)
TOTAL PROTEIN: 6.5 g/dL (ref 6.0–8.5)

## 2017-05-09 LAB — BASIC METABOLIC PANEL
BUN / CREAT RATIO: 23 (ref 12–28)
BUN: 19 mg/dL (ref 8–27)
CO2: 28 mmol/L (ref 20–29)
CREATININE: 0.84 mg/dL (ref 0.57–1.00)
Calcium: 9.9 mg/dL (ref 8.7–10.3)
Chloride: 98 mmol/L (ref 96–106)
GFR calc non Af Amer: 64 mL/min/{1.73_m2} (ref >59)
GFR, EST AFRICAN AMERICAN: 74 mL/min/{1.73_m2} (ref >59)
Glucose: 142 mg/dL — ABNORMAL HIGH (ref 65–99)
Potassium: 4.8 mmol/L (ref 3.5–5.2)
Sodium: 141 mmol/L (ref 134–144)

## 2017-05-09 LAB — LIPID PANEL
Chol/HDL Ratio: 4.8 ratio — ABNORMAL HIGH (ref 0.0–4.4)
Cholesterol, Total: 176 mg/dL (ref 100–199)
HDL: 37 mg/dL — ABNORMAL LOW (ref >39)
LDL CALC: 98 mg/dL (ref 0–99)
Triglycerides: 206 mg/dL — ABNORMAL HIGH (ref 0–149)
VLDL Cholesterol Cal: 41 mg/dL — ABNORMAL HIGH (ref 5–40)

## 2017-05-13 ENCOUNTER — Emergency Department (HOSPITAL_COMMUNITY)
Admission: EM | Admit: 2017-05-13 | Discharge: 2017-05-14 | Disposition: A | Discharge status: Home / Self Care | Payer: Medicare Other | Attending: Emergency Medicine | Admitting: Emergency Medicine

## 2017-05-13 ENCOUNTER — Ambulatory Visit (INDEPENDENT_AMBULATORY_CARE_PROVIDER_SITE_OTHER): Payer: Medicare Other | Admitting: Family Medicine

## 2017-05-13 ENCOUNTER — Encounter (HOSPITAL_COMMUNITY): Payer: Self-pay | Admitting: Emergency Medicine

## 2017-05-13 ENCOUNTER — Emergency Department (HOSPITAL_COMMUNITY): Payer: Medicare Other

## 2017-05-13 ENCOUNTER — Encounter: Payer: Self-pay | Admitting: Family Medicine

## 2017-05-13 VITALS — BP 168/72 | Ht 68.0 in | Wt 172.0 lb

## 2017-05-13 DIAGNOSIS — E1165 Type 2 diabetes mellitus with hyperglycemia: Secondary | ICD-10-CM | POA: Diagnosis not present

## 2017-05-13 DIAGNOSIS — Z7984 Long term (current) use of oral hypoglycemic drugs: Secondary | ICD-10-CM | POA: Insufficient documentation

## 2017-05-13 DIAGNOSIS — E119 Type 2 diabetes mellitus without complications: Secondary | ICD-10-CM

## 2017-05-13 DIAGNOSIS — I10 Essential (primary) hypertension: Secondary | ICD-10-CM | POA: Diagnosis not present

## 2017-05-13 DIAGNOSIS — Z79899 Other long term (current) drug therapy: Secondary | ICD-10-CM | POA: Diagnosis not present

## 2017-05-13 DIAGNOSIS — G4700 Insomnia, unspecified: Secondary | ICD-10-CM | POA: Diagnosis not present

## 2017-05-13 DIAGNOSIS — Z7982 Long term (current) use of aspirin: Secondary | ICD-10-CM | POA: Insufficient documentation

## 2017-05-13 DIAGNOSIS — E1169 Type 2 diabetes mellitus with other specified complication: Secondary | ICD-10-CM

## 2017-05-13 DIAGNOSIS — E785 Hyperlipidemia, unspecified: Secondary | ICD-10-CM | POA: Diagnosis not present

## 2017-05-13 DIAGNOSIS — R002 Palpitations: Secondary | ICD-10-CM | POA: Diagnosis not present

## 2017-05-13 LAB — CBC WITH DIFFERENTIAL/PLATELET
BASOS PCT: 0 %
Basophils Absolute: 0 10*3/uL (ref 0.0–0.1)
EOS ABS: 0.2 10*3/uL (ref 0.0–0.7)
EOS PCT: 2 %
HCT: 41.6 % (ref 36.0–46.0)
Hemoglobin: 13.6 g/dL (ref 12.0–15.0)
Lymphocytes Relative: 29 %
Lymphs Abs: 2.6 10*3/uL (ref 0.7–4.0)
MCH: 28.8 pg (ref 26.0–34.0)
MCHC: 32.7 g/dL (ref 30.0–36.0)
MCV: 87.9 fL (ref 78.0–100.0)
MONO ABS: 0.7 10*3/uL (ref 0.1–1.0)
Monocytes Relative: 7 %
NEUTROS ABS: 5.4 10*3/uL (ref 1.7–7.7)
Neutrophils Relative %: 62 %
PLATELETS: 330 10*3/uL (ref 150–400)
RBC: 4.73 MIL/uL (ref 3.87–5.11)
RDW: 14.3 % (ref 11.5–15.5)
WBC: 8.9 10*3/uL (ref 4.0–10.5)

## 2017-05-13 LAB — BASIC METABOLIC PANEL
ANION GAP: 12 (ref 5–15)
BUN: 21 mg/dL — AB (ref 6–20)
CALCIUM: 9.9 mg/dL (ref 8.9–10.3)
CO2: 27 mmol/L (ref 22–32)
Chloride: 99 mmol/L — ABNORMAL LOW (ref 101–111)
Creatinine, Ser: 0.91 mg/dL (ref 0.44–1.00)
GFR calc Af Amer: >60 mL/min (ref >60)
GFR, EST NON AFRICAN AMERICAN: 57 mL/min — AB (ref >60)
GLUCOSE: 118 mg/dL — AB (ref 65–99)
Potassium: 3.7 mmol/L (ref 3.5–5.1)
Sodium: 138 mmol/L (ref 135–145)

## 2017-05-13 LAB — CBG MONITORING, ED: GLUCOSE-CAPILLARY: 106 mg/dL — AB (ref 65–99)

## 2017-05-13 LAB — TROPONIN I: Troponin I: <0.03 ng/mL (ref <0.03)

## 2017-05-13 LAB — POCT GLYCOSYLATED HEMOGLOBIN (HGB A1C): Hemoglobin A1C: 6.5

## 2017-05-13 MED ORDER — INDAPAMIDE 2.5 MG PO TABS: ORAL_TABLET | ORAL | 1 refills | Status: DC

## 2017-05-13 MED ORDER — HYDRALAZINE HCL 10 MG PO TABS: ORAL_TABLET | ORAL | 5 refills | Status: DC

## 2017-05-13 MED ORDER — SITAGLIPTIN PHOSPHATE 50 MG PO TABS: 50.0000 mg | ORAL_TABLET | Freq: Every day | ORAL | 5 refills | Status: DC

## 2017-05-13 MED ORDER — ALPRAZOLAM 0.25 MG PO TABS: 0.2500 mg | ORAL_TABLET | Freq: Every evening | ORAL | 2 refills | Status: DC | PRN

## 2017-05-13 MED ORDER — LOSARTAN POTASSIUM 100 MG PO TABS: 100.0000 mg | ORAL_TABLET | Freq: Every day | ORAL | 1 refills | Status: DC

## 2017-05-13 NOTE — ED Triage Notes (Signed)
Pt reports HTN and hyperglycemia for several weeks, was seen by PCP Luking today and started on Hydralazine and Januvia today. Has taken Hydralazine tonight. Denies HA, N/V/, weakness, or neuro deficit.

## 2017-05-13 NOTE — Progress Notes (Signed)
Subjective:    Patient ID: [name], female    DOB: [birth year], 83 y.o.   MRN: [id number]  Diabetes  She presents for her follow-up diabetic visit. She has type 2 diabetes mellitus. Pertinent negatives for hypoglycemia include no confusion. Pertinent negatives for diabetes include no chest pain, no fatigue, no polydipsia, no polyphagia and no weakness.   She is currently on Metformin 1000 mg BID.She eats pretty healthy and gets some exercise. Sees Dr. Caprice Beaver for podiatry she normally sees him Q four months. She sees Dr. Shanon Brow Q year for her eye and see orthopedic Dr for her knee.Dr Jamas Lav. Time was spent discussing her blood pressure cholesterol compliance with medicine also significant time spent discussing social status patient is widowed stays by herself family she only has 1 son does not come around much she does have a older sister who lives at assisted living The patient states she is having a more difficult time getting around fixing food shopping she states she barely drives.  She feels she needs assistance but she would like to discuss it with her son Results for orders placed or performed in visit on 51/02/58  Basic metabolic panel  Result Value Ref Range   Glucose 142 (H) 65 - 99 mg/dL   BUN 19 8 - 27 mg/dL   Creatinine, Ser 0.84 0.57 - 1.00 mg/dL   GFR calc non Af Amer 64 >59 mL/min/1.73   GFR calc Af Amer 74 >59 mL/min/1.73   BUN/Creatinine Ratio 23 12 - 28   Sodium 141 134 - 144 mmol/L   Potassium 4.8 3.5 - 5.2 mmol/L   Chloride 98 96 - 106 mmol/L   CO2 28 20 - 29 mmol/L   Calcium 9.9 8.7 - 10.3 mg/dL  Lipid panel  Result Value Ref Range   Cholesterol, Total 176 100 - 199 mg/dL   Triglycerides 206 (H) 0 - 149 mg/dL   HDL 37 (L) >39 mg/dL   VLDL Cholesterol Cal 41 (H) 5 - 40 mg/dL   LDL Calculated 98 0 - 99 mg/dL   Chol/HDL Ratio 4.8 (H) 0.0 - 4.4 ratio  Hepatic function panel  Result Value Ref Range   Total Protein 6.5 6.0 - 8.5 g/dL   Albumin  4.4 3.5 - 4.7 g/dL   Bilirubin Total 0.5 0.0 - 1.2 mg/dL   Bilirubin, Direct 0.10 0.00 - 0.40 mg/dL   Alkaline Phosphatase 78 39 - 117 IU/L   AST 15 0 - 40 IU/L   ALT 11 0 - 32 IU/L    Review of Systems  Constitutional: Negative for activity change, appetite change and fatigue.  HENT: Negative for congestion.   Respiratory: Negative for cough.   Cardiovascular: Negative for chest pain.  Gastrointestinal: Negative for abdominal pain.  Endocrine: Negative for polydipsia and polyphagia.  Skin: Negative for color change.  Neurological: Negative for weakness.  Psychiatric/Behavioral: Negative for confusion.   Results for orders placed or performed in visit on 05/13/17  POCT glycosylated hemoglobin (Hb A1C)  Result Value Ref Range   Hemoglobin A1C 6.5        Objective:   Physical Exam  Constitutional: She appears well-developed and well-nourished. No distress.  HENT:  Head: Normocephalic and atraumatic.  Eyes: Right eye exhibits no discharge. Left eye exhibits no discharge.  Neck: No tracheal deviation present.  Cardiovascular: Normal rate, regular rhythm and normal heart sounds.  No murmur heard. Pulmonary/Chest: Effort normal and breath sounds normal. No respiratory distress. She has  no wheezes. She has no rales.  Musculoskeletal: She exhibits no edema.  Lymphadenopathy:    She has no cervical adenopathy.  Neurological: She is alert. She exhibits normal muscle tone.  Skin: Skin is warm and dry. No erythema.  Psychiatric: Her behavior is normal.  Vitals reviewed.          Assessment & Plan:  Diabetes subpar control we will go ahead and start Januvia metformin alone is not keeping things under control if Januvia causes abdominal pain or discomfort stop medicine Check glucose readings on a regular basis bring with her in 4 weeks time  Loneliness-patient denies being depressed but she is tired of living by herself she is looking at the possibility of assisted living she  does not like to drive its more difficult for her to fix food she will discuss things with her son  Elevated blood pressure add hydralazine 10 mg twice daily continue all other medicines follow-up in 4 weeks  Insomnia she uses a half of Xanax at night I told her not to go above that she may try melatonin  25 minutes was spent with the patient. Greater than half the time was spent in discussion and answering questions and counseling regarding the issues that the patient came in for today.

## 2017-05-13 NOTE — ED Provider Notes (Signed)
[facility] EMERGENCY DEPARTMENT Provider Note   CSN: [id number] Arrival date & time: 05/13/17  2140     History   Chief Complaint Chief Complaint  Patient presents with  . Hypertension  . Hyperglycemia    HPI [name] is a 83 y.o. female with a past medical history as outlined below which is significant this evening for diabetes and hypertension presenting with complaints of persistently elevated blood pressures and also uncontrolled diabetes.  However, she was seen by her PCP for this complaint today and was started on Januvia and hydralazine.  She took her blood pressure this evening and it was still elevated despite this new medication.  She reports watching TV this evening when she had a sudden onset of palpitations lasting more than 30 minutes and was accompanied by shortness of breath but no chest pain or dizziness.  She reports prior episodes of similar symptoms and has been seen by Dr. Johnsie Cancel for this, but her symptoms were so infrequent that no testing was warranted.  She does report increase stress, describing living alone and having more difficulty preparing meals and does not like to drive any longer.  She has also had forgetful episodes such as forgetting to take her morning medications which concerns her.  She discussed with her PCP today moving to an assisted living facility but is unsure about making this move.  She is currently symptom-free.    The history is provided by the patient.    Past Medical History:  Diagnosis Date  . Diabetes mellitus without complication ([place])   . Hypertension     Patient Active Problem List   Diagnosis Date Noted  . Hyperlipidemia associated with type 2 diabetes mellitus ([place]) 10/02/2016  . Pedal edema 10/02/2016  . Insomnia 10/02/2016  . SBO (small bowel obstruction) ([place]) 07/02/2016  . Dyspnea on exertion 02/24/2016  . Other fatigue   . E-coli UTI 06/18/2015  . Partial small bowel obstruction ([place]) 06/15/2015  .  Ileitis, regional, with intestinal obstruction 06/15/2015  . Leukocytosis 12/16/2014  . Nausea and vomiting 12/15/2014  . HLD (hyperlipidemia) 12/15/2014  . Essential hypertension 12/15/2014  . Controlled type 2 diabetes mellitus without complication, without long-term current use of insulin ([place]) 02/15/2011    Past Surgical History:  Procedure Laterality Date  . ABDOMINAL HYSTERECTOMY    . APPENDECTOMY    . BREAST REDUCTION SURGERY    . CARDIAC CATHETERIZATION    . COLON SURGERY Left    Hemicolectomy due to diverticulitis  . EYE SURGERY  03/30/2009   cataract  . KNEE SURGERY    . LAPAROSCOPIC INCISIONAL / UMBILICAL / VENTRAL HERNIA REPAIR  02/23/2007   . REFRACTIVE SURGERY      OB History    No data available       Home Medications    Prior to Admission medications   Medication Sig Start Date End Date Taking? Authorizing Provider  acetaminophen (TYLENOL) 500 MG tablet Take 500-1,000 mg by mouth every 6 (six) hours as needed for mild pain or moderate pain.   Yes [provider]  ALPRAZolam (XANAX) 0.25 MG tablet Take 1 tablet (0.25 mg total) at bedtime as needed by mouth for anxiety. Patient taking differently: Take 0.125-0.25 mg at bedtime as needed by mouth for anxiety.  05/13/17  Yes Kathyrn Drown, MD  aspirin 81 MG tablet Take 81 mg by mouth daily.     Yes [provider]  beta carotene w/minerals (OCUVITE) tablet Take 1 tablet by  mouth daily.   Yes [provider]  cholecalciferol (VITAMIN D) 1000 units tablet Take 1,000 Units by mouth daily.   Yes [provider]  cyanocobalamin 2000 MCG tablet Take 2,000 mcg by mouth daily.   Yes [provider]  hydrALAZINE (APRESOLINE) 10 MG tablet One bid Patient taking differently: Take 10 mg 2 (two) times daily by mouth. One bid 05/13/17  Yes Luking, Scott A, MD  indapamide (LOZOL) 2.5 MG tablet TAKE 1 TABLET BY MOUTH ONCE DAILY (STOP  LOSARTAN/HCTZ  AND  CONTINUE  AMLODIPINE)  05/13/17  Yes Luking, Elayne Snare, MD  losartan (COZAAR) 100 MG tablet Take 1 tablet (100 mg total) daily by mouth. 05/13/17  Yes Luking, Elayne Snare, MD  meclizine (ANTIVERT) 25 MG tablet Take 25 mg by mouth 3 (three) times daily as needed for dizziness or nausea.   Yes [provider]  metFORMIN (GLUCOPHAGE) 1000 MG tablet TAKE 1 TABLET BY MOUTH TWICE DAILY 03/31/17  Yes Luking, Elayne Snare, MD  Omega-3 Fatty Acids (FISH OIL) 1000 MG CAPS Take 1 capsule by mouth daily.   Yes [provider]  ondansetron (ZOFRAN ODT) 4 MG disintegrating tablet Take 1 tablet (4 mg total) by mouth every 8 (eight) hours as needed for nausea. 01/05/17  Yes Noemi Chapel, MD  polyvinyl alcohol (LIQUIFILM TEARS) 1.4 % ophthalmic solution Place 1 drop into both eyes 3 (three) times daily.    Yes [provider]  amLODipine (NORVASC) 10 MG tablet TAKE 1 TABLET BY MOUTH ONCE DAILY 03/28/17   Kathyrn Drown, MD  sitaGLIPtin (JANUVIA) 50 MG tablet Take 1 tablet (50 mg total) daily by mouth. 05/13/17   Kathyrn Drown, MD    Family History Family History  Problem Relation Age of Onset  . Hypertension Father        kidney  . Stroke Father   . Cancer Father   . Cancer Mother        ovaian  . Diabetes Brother   . Hypertension Sister     Social History Social History   Tobacco Use  . Smoking status: Never Smoker  . Smokeless tobacco: Never Used  Substance Use Topics  . Alcohol use: No  . Drug use: No     Allergies   Phenergan [promethazine hcl]; Statins; Trazodone and nefazodone; and Prednisone   Review of Systems Review of Systems  Constitutional: Negative for fever.  HENT: Negative for congestion and sore throat.   Eyes: Negative.   Respiratory: Positive for shortness of breath. Negative for chest tightness.   Cardiovascular: Positive for palpitations. Negative for chest pain.  Gastrointestinal: Negative for abdominal pain and nausea.  Genitourinary: Negative.   Musculoskeletal:  Negative for arthralgias, joint swelling and neck pain.  Skin: Negative.  Negative for rash and wound.  Neurological: Negative for dizziness, weakness, light-headedness, numbness and headaches.  Psychiatric/Behavioral: Positive for sleep disturbance. The patient is nervous/anxious.      Physical Exam Updated Vital Signs BP (!) 148/69 (BP Location: Right Arm)   Pulse 74   Temp 97.8 F (36.6 C) (Oral)   Resp 19   Ht 5\' 8"  (1.727 m)   Wt 78 kg (172 lb)   SpO2 98%   BMI 26.15 kg/m   Physical Exam  Constitutional: She appears well-developed and well-nourished.  HENT:  Head: Normocephalic and atraumatic.  Eyes: Conjunctivae are normal.  Neck: Normal range of motion.  Cardiovascular: Normal rate, regular rhythm, normal heart sounds and intact distal pulses.  Pulmonary/Chest:  Effort normal and breath sounds normal. She has no wheezes.  Abdominal: Soft. Bowel sounds are normal. There is no tenderness.  Musculoskeletal: Normal range of motion.  Neurological: She is alert.  Skin: Skin is warm and dry.  Psychiatric: She has a normal mood and affect.  Nursing note and vitals reviewed.    ED Treatments / Results  Labs (all labs ordered are listed, but only abnormal results are displayed) Labs Reviewed  BASIC METABOLIC PANEL - Abnormal; Notable for the following components:      Result Value   Chloride 99 (*)    Glucose, Bld 118 (*)    BUN 21 (*)    GFR calc non Af Amer 57 (*)    All other components within normal limits  CBG MONITORING, ED - Abnormal; Notable for the following components:   Glucose-Capillary 106 (*)    All other components within normal limits  CBC WITH DIFFERENTIAL/PLATELET  TROPONIN I    EKG  EKG Interpretation  Date/Time:  Tuesday May 13 2017 21:52:50 EST Ventricular Rate:  84 PR Interval:    QRS Duration: 82 QT Interval:  370 QTC Calculation: 438 R Axis:   23 Text Interpretation:  Sinus rhythm Anterior infarct, old Interpretation limited  secondary to artifact Confirmed by Ripley Fraise ([id number]) on 05/13/2017 11:50:06 PM       Radiology Dg Chest 2 View  Result Date: 05/13/2017 CLINICAL DATA:  83 year old female with palpitation. EXAM: CHEST  2 VIEW COMPARISON:  Chest radiograph dated 02/24/2016 FINDINGS: There are bibasilar linear atelectasis/scarring. There is no focal consolidation, pleural effusion, or pneumothorax. Stable cardiac silhouette. There is atherosclerotic calcification of the aortic arch. No acute osseous pathology. IMPRESSION: No active cardiopulmonary disease. Electronically Signed   By: Anner Crete M.D.   On: 05/13/2017 23:47    Procedures Procedures (including critical care time)  Medications Ordered in ED Medications - No data to display   Initial Impression / Assessment and Plan / ED Course  I have reviewed the triage vital signs and the nursing notes.  Pertinent labs & imaging results that were available during my care of the patient were reviewed by me and considered in my medical decision making (see chart for details).     Discussion with son outside of room who suspects his mother is having anxiety/ panic attacks.  She saw 2 ads on tv tonight indicating her losartan has been recalled after which her palpitations began.  She does take 1/2 xanax tablet prior to bed for sleep only.    Labs, EKG and chest x-ray stable.  No evidence of endorgan damage secondary to uncontrolled blood pressure.  CBG normal range this evening.  She was encouraged to give her new blood pressure and diabetes medications a chance to get her symptoms under better control.  Plan close follow-up with her PCP for further evaluation if symptoms persist.  Patient was seen by Dr. Christy Gentles prior to discharge home.  Is asymptomatic at time of discharge.  Final Clinical Impressions(s) / ED Diagnoses   Final diagnoses:  Essential hypertension  Palpitation    ED Discharge Orders    None       Landis Martins 05/14/17 0123    Ripley Fraise, MD 05/15/17 [phone]

## 2017-05-14 NOTE — Discharge Instructions (Signed)
Your lab tests, chest xray and ekg are stable tonight with no worrisome findings.  Continue taking the new medicines your doctor put you on today as these may help improve your blood pressure and give you better glucose control.

## 2017-05-19 ENCOUNTER — Ambulatory Visit (INDEPENDENT_AMBULATORY_CARE_PROVIDER_SITE_OTHER): Payer: Medicare Other | Admitting: Otolaryngology

## 2017-05-26 ENCOUNTER — Ambulatory Visit (INDEPENDENT_AMBULATORY_CARE_PROVIDER_SITE_OTHER): Payer: Medicare Other | Admitting: Otolaryngology

## 2017-05-26 DIAGNOSIS — H6123 Impacted cerumen, bilateral: Secondary | ICD-10-CM

## 2017-06-04 ENCOUNTER — Telehealth: Payer: Self-pay | Admitting: Family Medicine

## 2017-06-04 MED ORDER — SITAGLIPTIN PHOSPHATE 100 MG PO TABS: 100.0000 mg | ORAL_TABLET | Freq: Every day | ORAL | 3 refills | Status: DC

## 2017-06-04 NOTE — Telephone Encounter (Signed)
Please increase Januvia, new dose 100 mg #30 with 3 refills, reschedule follow-up office visit for a few weeks down the road to see the effect of this medicine.

## 2017-06-04 NOTE — Telephone Encounter (Signed)
Please advise 

## 2017-06-04 NOTE — Telephone Encounter (Signed)
Patient had a 4 week follow up on her new medication scheduled for this coming Monday but cancelled due to the upcoming weather.  She said that her blood sugar has been running a little bit better, but not a whole lot better.  It was 174 this morning, but last night it was 130.  She has enough of the medication to last her until 06/12/17 and wants to know what Dr. Nicki Reaper recommends.

## 2017-06-04 NOTE — Telephone Encounter (Signed)
Pt is aware we increased the medications and sent to the pharmacy.She was transferred up front to schedule follow up visit on the increased Januvia for a couple of weeks out to see how Bg doing.

## 2017-06-09 ENCOUNTER — Ambulatory Visit: Payer: Medicare Other | Admitting: Family Medicine

## 2017-06-17 ENCOUNTER — Encounter: Payer: Self-pay | Admitting: Family Medicine

## 2017-06-17 ENCOUNTER — Ambulatory Visit (INDEPENDENT_AMBULATORY_CARE_PROVIDER_SITE_OTHER): Payer: Medicare Other | Admitting: Family Medicine

## 2017-06-17 VITALS — BP 122/60 | Ht 68.0 in | Wt 171.2 lb

## 2017-06-17 DIAGNOSIS — I10 Essential (primary) hypertension: Secondary | ICD-10-CM | POA: Diagnosis not present

## 2017-06-17 DIAGNOSIS — E119 Type 2 diabetes mellitus without complications: Secondary | ICD-10-CM | POA: Diagnosis not present

## 2017-06-17 DIAGNOSIS — E785 Hyperlipidemia, unspecified: Secondary | ICD-10-CM | POA: Diagnosis not present

## 2017-06-17 DIAGNOSIS — E1169 Type 2 diabetes mellitus with other specified complication: Secondary | ICD-10-CM

## 2017-06-17 NOTE — Progress Notes (Signed)
   Subjective:    Patient ID: [name], female    DOB: [birth year], 83 y.o.   MRN: [id number]  HPI Patient is here today for a follow up on her Dm. She states we increased her Januvia from 50 mg daily to 100 mg daily.She states her Bs have be doing better with this change.She brought her readings with her today. Glucose readings reviewed much improved compared to where they were blood pressure stable patient watching her diet moods are doing better  Review of Systems  Constitutional: Negative for activity change, appetite change and fatigue.  HENT: Negative for congestion.   Respiratory: Negative for cough.   Cardiovascular: Negative for chest pain.  Gastrointestinal: Negative for abdominal pain.  Endocrine: Negative for polydipsia and polyphagia.  Skin: Negative for color change.  Neurological: Negative for weakness.  Psychiatric/Behavioral: Negative for confusion.       Objective:   Physical Exam  Constitutional: She appears well-developed and well-nourished. No distress.  HENT:  Head: Normocephalic and atraumatic.  Eyes: Right eye exhibits no discharge. Left eye exhibits no discharge.  Neck: No tracheal deviation present.  Cardiovascular: Normal rate, regular rhythm and normal heart sounds.  No murmur heard. Pulmonary/Chest: Effort normal and breath sounds normal. No respiratory distress. She has no wheezes. She has no rales.  Musculoskeletal: She exhibits no edema.  Lymphadenopathy:    She has no cervical adenopathy.  Neurological: She is alert. She exhibits normal muscle tone.  Skin: Skin is warm and dry. No erythema.  Psychiatric: Her behavior is normal.  Vitals reviewed.         Assessment & Plan:  Her moods are doing better She states she will more than likely go into a supervised rest home somewhere in the near future Diabetes good control continue current measures Follow-up in several months check lab work before follow-up

## 2017-07-02 ENCOUNTER — Telehealth: Payer: Self-pay | Admitting: Family Medicine

## 2017-07-02 MED ORDER — SITAGLIPTIN PHOSPHATE 50 MG PO TABS: ORAL_TABLET | ORAL | 5 refills | Status: DC

## 2017-07-02 NOTE — Telephone Encounter (Signed)
Patient is aware.New rx sent to the pharmacy [facility].

## 2017-07-02 NOTE — Telephone Encounter (Signed)
I believe most likely that this is a combination of her Januvia along with the metformin her most recent A1c overall look good at 6.5 it would be reasonable to reduce the dose given her age and her kidney function I would recommend reducing her Januvia to 50 mg 1 daily and see if she does better in regards to her symptoms have her symptoms persist the next step would be reducing the metformin-she will be due for her follow-up lab work later this winter late February early [name] is already in the system

## 2017-07-02 NOTE — Telephone Encounter (Signed)
Patient said that the Januvia 100 mg is really bothering her stomach and making her nauseous.  She said Dr. Nicki Reaper told her to call in and he would change this if this occurred.  [facility] [place]

## 2017-07-02 NOTE — Telephone Encounter (Signed)
Pt state she has been on januvia 100 for a month now and it causes nausea,and loose stools. Would like to know what you recommend. Please advise

## 2017-07-29 DIAGNOSIS — B351 Tinea unguium: Secondary | ICD-10-CM | POA: Diagnosis not present

## 2017-08-19 ENCOUNTER — Other Ambulatory Visit: Payer: Self-pay | Admitting: *Deleted

## 2017-08-19 MED ORDER — SITAGLIPTIN PHOSPHATE 50 MG PO TABS: ORAL_TABLET | ORAL | 1 refills | Status: DC

## 2017-09-04 DIAGNOSIS — D1801 Hemangioma of skin and subcutaneous tissue: Secondary | ICD-10-CM | POA: Diagnosis not present

## 2017-09-04 DIAGNOSIS — D2339 Other benign neoplasm of skin of other parts of face: Secondary | ICD-10-CM | POA: Diagnosis not present

## 2017-09-04 DIAGNOSIS — X32XXXD Exposure to sunlight, subsequent encounter: Secondary | ICD-10-CM | POA: Diagnosis not present

## 2017-09-04 DIAGNOSIS — L570 Actinic keratosis: Secondary | ICD-10-CM | POA: Diagnosis not present

## 2017-09-04 DIAGNOSIS — L820 Inflamed seborrheic keratosis: Secondary | ICD-10-CM | POA: Diagnosis not present

## 2017-09-08 DIAGNOSIS — E119 Type 2 diabetes mellitus without complications: Secondary | ICD-10-CM | POA: Diagnosis not present

## 2017-09-08 DIAGNOSIS — I10 Essential (primary) hypertension: Secondary | ICD-10-CM | POA: Diagnosis not present

## 2017-09-09 LAB — BASIC METABOLIC PANEL
BUN / CREAT RATIO: 21 (ref 12–28)
BUN: 19 mg/dL (ref 8–27)
CO2: 28 mmol/L (ref 20–29)
CREATININE: 0.91 mg/dL (ref 0.57–1.00)
Calcium: 10.2 mg/dL (ref 8.7–10.3)
Chloride: 98 mmol/L (ref 96–106)
GFR calc non Af Amer: 58 mL/min/{1.73_m2} — ABNORMAL LOW (ref >59)
GFR, EST AFRICAN AMERICAN: 67 mL/min/{1.73_m2} (ref >59)
Glucose: 121 mg/dL — ABNORMAL HIGH (ref 65–99)
Potassium: 4.6 mmol/L (ref 3.5–5.2)
Sodium: 140 mmol/L (ref 134–144)

## 2017-09-09 LAB — HEMOGLOBIN A1C
Est. average glucose Bld gHb Est-mCnc: 134 mg/dL
Hgb A1c MFr Bld: 6.3 % — ABNORMAL HIGH (ref 4.8–5.6)

## 2017-09-16 ENCOUNTER — Ambulatory Visit (INDEPENDENT_AMBULATORY_CARE_PROVIDER_SITE_OTHER): Payer: Medicare Other | Admitting: Family Medicine

## 2017-09-16 ENCOUNTER — Encounter: Payer: Self-pay | Admitting: Family Medicine

## 2017-09-16 VITALS — BP 128/84 | Ht 68.0 in | Wt 172.4 lb

## 2017-09-16 DIAGNOSIS — I10 Essential (primary) hypertension: Secondary | ICD-10-CM

## 2017-09-16 DIAGNOSIS — E119 Type 2 diabetes mellitus without complications: Secondary | ICD-10-CM | POA: Diagnosis not present

## 2017-09-16 NOTE — Progress Notes (Signed)
Subjective:    Patient ID: [name], female    DOB: 11-29-1931, 84 y.o.   MRN: [id number]  Diabetes  She presents for her follow-up diabetic visit. She has type 2 diabetes mellitus. Pertinent negatives for hypoglycemia include no confusion. Pertinent negatives for diabetes include no chest pain, no fatigue, no polydipsia, no polyphagia and no weakness. Risk factors for coronary artery disease include diabetes mellitus, hypertension, dyslipidemia and post-menopausal. Current diabetic treatment includes oral agent (dual therapy). She is compliant with treatment all of the time. Her weight is stable. She is following a diabetic diet. She has not had a previous visit with a dietitian. She sees a podiatrist.Eye exam is not current.   Results for orders placed or performed in visit on 40/34/74  Basic metabolic panel  Result Value Ref Range   Glucose 121 (H) 65 - 99 mg/dL   BUN 19 8 - 27 mg/dL   Creatinine, Ser 0.91 0.57 - 1.00 mg/dL   GFR calc non Af Amer 58 (L) >59 mL/min/1.73   GFR calc Af Amer 67 >59 mL/min/1.73   BUN/Creatinine Ratio 21 12 - 28   Sodium 140 134 - 144 mmol/L   Potassium 4.6 3.5 - 5.2 mmol/L   Chloride 98 96 - 106 mmol/L   CO2 28 20 - 29 mmol/L   Calcium 10.2 8.7 - 10.3 mg/dL  Hemoglobin A1c  Result Value Ref Range   Hgb A1c MFr Bld 6.3 (H) 4.8 - 5.6 %   Est. average glucose Bld gHb Est-mCnc 134 mg/dL   The patient was seen today as part of a comprehensive diabetic check up.The patient relates medication compliance. No significant side effects to the medications. Denies any low glucose spells. Relates compliance with diet to a reasonable level. Patient does do labwork intermittently and understands the dangers of diabetes.   Patient here for follow-up regarding cholesterol.  Patient does try to maintain a reasonable diet.  Patient does take the medication on a regular basis.  Denies missing a dose.  The patient denies any obvious side effects.  Prior blood work  results reviewed with the patient.  The patient is aware of his cholesterol goals and the need to keep it under good control to lessen the risk of disease.  Review of Systems  Constitutional: Negative for activity change, appetite change and fatigue.  HENT: Negative for congestion.   Respiratory: Negative for cough.   Cardiovascular: Negative for chest pain.  Gastrointestinal: Negative for abdominal pain.  Endocrine: Negative for polydipsia and polyphagia.  Skin: Negative for color change.  Neurological: Negative for weakness.  Psychiatric/Behavioral: Negative for confusion.       Objective:   Physical Exam  Constitutional: She appears well-developed and well-nourished. No distress.  HENT:  Head: Normocephalic and atraumatic.  Eyes: Right eye exhibits no discharge. Left eye exhibits no discharge.  Neck: No tracheal deviation present.  Cardiovascular: Normal rate, regular rhythm and normal heart sounds.  No murmur heard. Pulmonary/Chest: Effort normal and breath sounds normal. No respiratory distress. She has no wheezes. She has no rales.  Musculoskeletal: She exhibits no edema.  Lymphadenopathy:    She has no cervical adenopathy.  Neurological: She is alert. She exhibits normal muscle tone.  Skin: Skin is warm and dry. No erythema.  Psychiatric: Her behavior is normal.  Vitals reviewed.         Assessment & Plan:  The patient was seen today as part of a comprehensive visit for diabetes. The importance of keeping  her A1c at or below 7 was discussed. Importance of regular physical activity was discussed. Proper monitoring of glucose levels with glucometer discussed. The importance of adherence to medication as well as a controlled low starch/sugar diet was also discussed. Also discussion regarding the importance of diabetic foot checks including self check every day. Also yearly diabetic eye exams recommended. The importance of keeping blood pressure under control and keeping LDL  below 70 was also discussed. Also the importance of avoiding smoking. Standard follow-up visit recommended. Finally failure to follow good diabetic measures including self effort and compliance with recommendations can certainly increase the risk of heart disease strokes kidney failure blindness loss of limb and early death was discussed with the patient.  The patient was seen today as part of an evaluation regarding hyperlipidemia. Recent lab work has been reviewed with the patient as well as the goals for good cholesterol care. In addition to this medications have been discussed the importance of compliance with diet and medications discussed as well. Patient has been informed of potential side effects of medications in the importance to notify us should any problems occur. Finally the patient is aware that poor control of cholesterol, noncompliance can dramatically increase her risk of heart attack strokes and premature death. The patient will keep regular office visits and the patient does agreed to periodic lab work.   Overall control is good follow-up in 4 months

## 2017-09-16 NOTE — Patient Instructions (Addendum)
Results for orders placed or performed in visit on 60/63/01  Basic metabolic panel  Result Value Ref Range   Glucose 121 (H) 65 - 99 mg/dL   BUN 19 8 - 27 mg/dL   Creatinine, Ser 0.91 0.57 - 1.00 mg/dL   GFR calc non Af Amer 58 (L) >59 mL/min/1.73   GFR calc Af Amer 67 >59 mL/min/1.73   BUN/Creatinine Ratio 21 12 - 28   Sodium 140 134 - 144 mmol/L   Potassium 4.6 3.5 - 5.2 mmol/L   Chloride 98 96 - 106 mmol/L   CO2 28 20 - 29 mmol/L   Calcium 10.2 8.7 - 10.3 mg/dL  Hemoglobin A1c  Result Value Ref Range   Hgb A1c MFr Bld 6.3 (H) 4.8 - 5.6 %   Est. average glucose Bld gHb Est-mCnc 134 mg/dL  These labs were ordered in [place] but were drawn on September 08 2017

## 2017-09-18 ENCOUNTER — Other Ambulatory Visit: Payer: Self-pay | Admitting: Family Medicine

## 2017-09-22 ENCOUNTER — Other Ambulatory Visit: Payer: Self-pay | Admitting: Family Medicine

## 2017-09-29 ENCOUNTER — Telehealth: Payer: Self-pay | Admitting: Family Medicine

## 2017-09-29 NOTE — Telephone Encounter (Signed)
Patient may have twice daily testing because of fluctuating sugar readings.  Please give her a prescription.  Unfortunately we cannot guarantee that her Medicare insurance carrier will cover this.  If they do not cover twice daily testing I recommend for the patient to reduce it to once daily every other day she could check in the morning every other day she could check in the evening-keep all regular follow-up visits

## 2017-09-29 NOTE — Telephone Encounter (Signed)
Pt is not on insulin. Do you want her testing twice daily or once daily.

## 2017-09-29 NOTE — Telephone Encounter (Signed)
Pt is needing refills on her easy max test strips. Pt states that she tests twice a day and is needing it to reflect that.     [facility]

## 2017-09-30 NOTE — Telephone Encounter (Signed)
Script awaiting signature; pt aware

## 2017-09-30 NOTE — Telephone Encounter (Signed)
rx faxed to [facility]

## 2017-10-18 ENCOUNTER — Other Ambulatory Visit: Payer: Self-pay | Admitting: Family Medicine

## 2017-10-28 DIAGNOSIS — H353110 Nonexudative age-related macular degeneration, right eye, stage unspecified: Secondary | ICD-10-CM | POA: Diagnosis not present

## 2017-11-07 DIAGNOSIS — B351 Tinea unguium: Secondary | ICD-10-CM | POA: Diagnosis not present

## 2017-11-11 ENCOUNTER — Telehealth: Payer: Self-pay | Admitting: Family Medicine

## 2017-11-11 NOTE — Telephone Encounter (Signed)
Patient said that it has been a couple of years since she had a mammogram.  She said she is not having any issues and wants to know if it is necessary for her to have one?

## 2017-11-11 NOTE — Telephone Encounter (Signed)
Last mammo 12/02/16 recommended screening in one year. Pt notified. Pt states she will call and make her own appointment. She has the number to call.

## 2017-11-18 DIAGNOSIS — M1711 Unilateral primary osteoarthritis, right knee: Secondary | ICD-10-CM | POA: Diagnosis not present

## 2017-11-18 DIAGNOSIS — M25561 Pain in right knee: Secondary | ICD-10-CM | POA: Diagnosis not present

## 2017-11-25 DIAGNOSIS — M1711 Unilateral primary osteoarthritis, right knee: Secondary | ICD-10-CM | POA: Diagnosis not present

## 2017-11-27 ENCOUNTER — Other Ambulatory Visit: Payer: Self-pay | Admitting: Family Medicine

## 2017-12-02 DIAGNOSIS — M1711 Unilateral primary osteoarthritis, right knee: Secondary | ICD-10-CM | POA: Diagnosis not present

## 2017-12-08 ENCOUNTER — Telehealth: Payer: Self-pay | Admitting: Family Medicine

## 2017-12-08 DIAGNOSIS — Z79899 Other long term (current) drug therapy: Secondary | ICD-10-CM

## 2017-12-08 DIAGNOSIS — E119 Type 2 diabetes mellitus without complications: Secondary | ICD-10-CM

## 2017-12-08 DIAGNOSIS — E785 Hyperlipidemia, unspecified: Secondary | ICD-10-CM

## 2017-12-08 DIAGNOSIS — E1169 Type 2 diabetes mellitus with other specified complication: Secondary | ICD-10-CM

## 2017-12-08 NOTE — Telephone Encounter (Signed)
Dr Nicki Reaper to see

## 2017-12-08 NOTE — Telephone Encounter (Signed)
Patient said that the Rx for Januvia is too expensive for her.  She is requesting an alternative to be called in.  [facility]

## 2017-12-09 NOTE — Telephone Encounter (Signed)
I would recommend for the patient to stop Januvia.  Glipizide and metformin tend to be the least expensive.  Glipizide not indicated because of her age.  Other medications tend to be very expensive.  Outside of our control.  This may be taken off of her med list.  It would be important for her to maintain a healthy diet, continue the metformin, please let the patient know that there are no inexpensive diabetic medicines.  At her age glipizide and similar medications are not recommended.  I would recommend the patient follow-up with [place] in approximately 8 weeks and we will check an A1c here in the office.

## 2017-12-10 ENCOUNTER — Other Ambulatory Visit: Payer: Self-pay | Admitting: Family Medicine

## 2017-12-10 DIAGNOSIS — Z1231 Encounter for screening mammogram for malignant neoplasm of breast: Secondary | ICD-10-CM

## 2017-12-10 NOTE — Telephone Encounter (Signed)
I called and left a message to r/c. 

## 2017-12-10 NOTE — Telephone Encounter (Signed)
Patient is aware 

## 2017-12-10 NOTE — Telephone Encounter (Signed)
Discussed with pt. Pt verbalized understanding. Pt states she has an appt July 19th and she wants to keep that. Its for her 4 month check up. She wants to know if she needs to have any bloodwork prior to appt.

## 2017-12-10 NOTE — Telephone Encounter (Signed)
Last labs 09/08/17 a1c and bmp

## 2017-12-10 NOTE — Telephone Encounter (Signed)
Please have patient do hemoglobin Y2B, metabolic 7, lipid, liver, urine ACR before the next visit

## 2017-12-13 DIAGNOSIS — R300 Dysuria: Secondary | ICD-10-CM | POA: Diagnosis not present

## 2017-12-13 DIAGNOSIS — N390 Urinary tract infection, site not specified: Secondary | ICD-10-CM | POA: Diagnosis not present

## 2017-12-14 ENCOUNTER — Other Ambulatory Visit: Payer: Self-pay | Admitting: Family Medicine

## 2017-12-15 ENCOUNTER — Ambulatory Visit (HOSPITAL_COMMUNITY)
Admission: RE | Admit: 2017-12-15 | Discharge: 2017-12-15 | Disposition: A | Discharge status: Home / Self Care | Payer: Medicare Other | Source: Ambulatory Visit | Attending: Family Medicine | Admitting: Family Medicine

## 2017-12-15 ENCOUNTER — Encounter (HOSPITAL_COMMUNITY): Payer: Self-pay

## 2017-12-15 DIAGNOSIS — Z1231 Encounter for screening mammogram for malignant neoplasm of breast: Secondary | ICD-10-CM | POA: Diagnosis not present

## 2017-12-16 ENCOUNTER — Other Ambulatory Visit: Payer: Self-pay | Admitting: Family Medicine

## 2017-12-27 DIAGNOSIS — E119 Type 2 diabetes mellitus without complications: Secondary | ICD-10-CM | POA: Diagnosis not present

## 2017-12-27 DIAGNOSIS — Z79899 Other long term (current) drug therapy: Secondary | ICD-10-CM | POA: Diagnosis not present

## 2017-12-27 DIAGNOSIS — E1169 Type 2 diabetes mellitus with other specified complication: Secondary | ICD-10-CM | POA: Diagnosis not present

## 2017-12-27 DIAGNOSIS — E785 Hyperlipidemia, unspecified: Secondary | ICD-10-CM | POA: Diagnosis not present

## 2017-12-28 ENCOUNTER — Encounter: Payer: Self-pay | Admitting: Family Medicine

## 2017-12-28 LAB — BASIC METABOLIC PANEL
BUN / CREAT RATIO: 15 (ref 12–28)
BUN: 14 mg/dL (ref 8–27)
CO2: 25 mmol/L (ref 20–29)
CREATININE: 0.94 mg/dL (ref 0.57–1.00)
Calcium: 10 mg/dL (ref 8.7–10.3)
Chloride: 100 mmol/L (ref 96–106)
GFR calc non Af Amer: 56 mL/min/{1.73_m2} — ABNORMAL LOW (ref >59)
GFR, EST AFRICAN AMERICAN: 64 mL/min/{1.73_m2} (ref >59)
Glucose: 127 mg/dL — ABNORMAL HIGH (ref 65–99)
Potassium: 4.7 mmol/L (ref 3.5–5.2)
Sodium: 142 mmol/L (ref 134–144)

## 2017-12-28 LAB — HEPATIC FUNCTION PANEL
ALT: 14 IU/L (ref 0–32)
AST: 16 IU/L (ref 0–40)
Albumin: 4.5 g/dL (ref 3.5–4.7)
Alkaline Phosphatase: 73 IU/L (ref 39–117)
Bilirubin Total: 0.8 mg/dL (ref 0.0–1.2)
Bilirubin, Direct: 0.16 mg/dL (ref 0.00–0.40)
TOTAL PROTEIN: 6.9 g/dL (ref 6.0–8.5)

## 2017-12-28 LAB — LIPID PANEL
CHOL/HDL RATIO: 4.3 ratio (ref 0.0–4.4)
Cholesterol, Total: 156 mg/dL (ref 100–199)
HDL: 36 mg/dL — ABNORMAL LOW (ref >39)
LDL CALC: 82 mg/dL (ref 0–99)
Triglycerides: 188 mg/dL — ABNORMAL HIGH (ref 0–149)
VLDL CHOLESTEROL CAL: 38 mg/dL (ref 5–40)

## 2017-12-28 LAB — MICROALBUMIN / CREATININE URINE RATIO
Creatinine, Urine: 39.4 mg/dL
MICROALB/CREAT RATIO: 9.6 mg/g{creat} (ref 0.0–30.0)
MICROALBUM., U, RANDOM: 3.8 ug/mL

## 2017-12-28 LAB — HEMOGLOBIN A1C
Est. average glucose Bld gHb Est-mCnc: 134 mg/dL
Hgb A1c MFr Bld: 6.3 % — ABNORMAL HIGH (ref 4.8–5.6)

## 2018-01-16 ENCOUNTER — Encounter: Payer: Self-pay | Admitting: Family Medicine

## 2018-01-16 ENCOUNTER — Ambulatory Visit (INDEPENDENT_AMBULATORY_CARE_PROVIDER_SITE_OTHER): Payer: Medicare Other | Admitting: Family Medicine

## 2018-01-16 VITALS — BP 130/84 | Ht 68.0 in | Wt 169.4 lb

## 2018-01-16 DIAGNOSIS — E785 Hyperlipidemia, unspecified: Secondary | ICD-10-CM

## 2018-01-16 DIAGNOSIS — E1169 Type 2 diabetes mellitus with other specified complication: Secondary | ICD-10-CM

## 2018-01-16 DIAGNOSIS — E119 Type 2 diabetes mellitus without complications: Secondary | ICD-10-CM | POA: Diagnosis not present

## 2018-01-16 DIAGNOSIS — I10 Essential (primary) hypertension: Secondary | ICD-10-CM | POA: Diagnosis not present

## 2018-01-16 DIAGNOSIS — G4700 Insomnia, unspecified: Secondary | ICD-10-CM | POA: Diagnosis not present

## 2018-01-16 DIAGNOSIS — R600 Localized edema: Secondary | ICD-10-CM | POA: Diagnosis not present

## 2018-01-16 MED ORDER — INDAPAMIDE 2.5 MG PO TABS: ORAL_TABLET | ORAL | 3 refills | Status: DC

## 2018-01-16 MED ORDER — AMLODIPINE BESYLATE 5 MG PO TABS: 5.0000 mg | ORAL_TABLET | Freq: Every day | ORAL | 1 refills | Status: DC

## 2018-01-16 NOTE — Patient Instructions (Signed)
Results for orders placed or performed in visit on 12/08/17  Hemoglobin A1c  Result Value Ref Range   Hgb A1c MFr Bld 6.3 (H) 4.8 - 5.6 %   Est. average glucose Bld gHb Est-mCnc 134 mg/dL  Basic metabolic panel  Result Value Ref Range   Glucose 127 (H) 65 - 99 mg/dL   BUN 14 8 - 27 mg/dL   Creatinine, Ser 0.94 0.57 - 1.00 mg/dL   GFR calc non Af Amer 56 (L) >59 mL/min/1.73   GFR calc Af Amer 64 >59 mL/min/1.73   BUN/Creatinine Ratio 15 12 - 28   Sodium 142 134 - 144 mmol/L   Potassium 4.7 3.5 - 5.2 mmol/L   Chloride 100 96 - 106 mmol/L   CO2 25 20 - 29 mmol/L   Calcium 10.0 8.7 - 10.3 mg/dL  Lipid panel  Result Value Ref Range   Cholesterol, Total 156 100 - 199 mg/dL   Triglycerides 188 (H) 0 - 149 mg/dL   HDL 36 (L) >39 mg/dL   VLDL Cholesterol Cal 38 5 - 40 mg/dL   LDL Calculated 82 0 - 99 mg/dL   Chol/HDL Ratio 4.3 0.0 - 4.4 ratio  Hepatic function panel  Result Value Ref Range   Total Protein 6.9 6.0 - 8.5 g/dL   Albumin 4.5 3.5 - 4.7 g/dL   Bilirubin Total 0.8 0.0 - 1.2 mg/dL   Bilirubin, Direct 0.16 0.00 - 0.40 mg/dL   Alkaline Phosphatase 73 39 - 117 IU/L   AST 16 0 - 40 IU/L   ALT 14 0 - 32 IU/L  Microalbumin / creatinine urine ratio  Result Value Ref Range   Creatinine, Urine 39.4 Not Estab. mg/dL   Microalbumin, Urine 3.8 Not Estab. ug/mL   Microalb/Creat Ratio 9.6 0.0 - 30.0 mg/g creat

## 2018-01-16 NOTE — Progress Notes (Signed)
   Subjective:    Patient ID: [name], female    DOB: 1932-03-19, 84 y.o.   MRN: [id number]  Hypertension  This is a chronic problem. The current episode started more than 1 year ago. Pertinent negatives include no chest pain or shortness of breath. Risk factors for coronary artery disease include diabetes mellitus, dyslipidemia and post-menopausal state. Treatments tried: norvasc, lozol. There are no compliance problems.   Labs reviewed in detail Patient was has some difficulty sleeping Discuss recent labs Discuss trouble sleeping But she is able to get several hours of sleep per night  Patient here for follow-up regarding cholesterol.  The patient does have hyperlipidemia.  Patient does try to maintain a reasonable diet.  Patient does take the medication on a regular basis.  Denies missing a dose.  The patient denies any obvious side effects.  Prior blood work results reviewed with the patient.  The patient is aware of his cholesterol goals and the need to keep it under good control to lessen the risk of disease.  Review of Systems  Constitutional: Negative for activity change, appetite change and fatigue.  HENT: Negative for congestion and rhinorrhea.   Respiratory: Negative for cough and shortness of breath.   Cardiovascular: Negative for chest pain and leg swelling.  Gastrointestinal: Negative for abdominal pain and constipation.  Endocrine: Negative for polydipsia and polyphagia.  Skin: Negative for color change.  Neurological: Negative for weakness.  Psychiatric/Behavioral: Negative for confusion.       Objective:   Physical Exam  Constitutional: She appears well-nourished. No distress.  HENT:  Head: Normocephalic and atraumatic.  Eyes: Right eye exhibits no discharge. Left eye exhibits no discharge.  Cardiovascular: Normal rate, regular rhythm and normal heart sounds.  No murmur heard. Pulmonary/Chest: Effort normal and breath sounds normal. No respiratory distress.    Musculoskeletal: She exhibits no edema.  Lymphadenopathy:    She has no cervical adenopathy.  Neurological: She is alert. She exhibits normal muscle tone.  Psychiatric: Her behavior is normal.  Vitals reviewed.   Diabetic foot exam completed see documentation      Assessment & Plan:  Diabetes good control continue current medication watch diet closely stay active  Hyperlipidemia previous labs reviewed lab work to be done toward the end of this year continue watching diet medication  Blood pressure good control continue current medication watch diet minimize salt stay physically active  Insomnia issues I do not recommend any type of prescription medicine may use melatonin she still gets 5 to 6 hours of sleep per night which I believe is overall good  Pedal edema related to amlodipine 10 mg reduce it to 5 mg she has an appointment with cardiology later this summer she will follow-up here in 6 months  Denies any depression going on currently / 15 minutes was spent with patient today discussing healthcare issues which they came.  More than 50% of this visit-total duration of visit-was spent in counseling and coordination of care.  Please see diagnosis regarding the focus of this coordination and care

## 2018-01-22 ENCOUNTER — Encounter: Payer: Self-pay | Admitting: Family Medicine

## 2018-01-22 ENCOUNTER — Telehealth: Payer: Self-pay | Admitting: Family Medicine

## 2018-01-22 ENCOUNTER — Ambulatory Visit (INDEPENDENT_AMBULATORY_CARE_PROVIDER_SITE_OTHER): Payer: Medicare Other | Admitting: Family Medicine

## 2018-01-22 VITALS — BP 142/80 | Temp 98.4°F | Ht 68.0 in | Wt 166.0 lb

## 2018-01-22 DIAGNOSIS — I10 Essential (primary) hypertension: Secondary | ICD-10-CM | POA: Diagnosis not present

## 2018-01-22 MED ORDER — AMLODIPINE BESYLATE 10 MG PO TABS: ORAL_TABLET | ORAL | 1 refills | Status: DC

## 2018-01-22 NOTE — Progress Notes (Signed)
   Subjective:    Patient ID: [name], female    DOB: Nov 20, 1931, 84 y.o.   MRN: [id number]  HPI Pt here today due to her BP being high and she states she can feel her pulse in her head. Pt has a dull headache. Pt states she did not sleep well last night, baby sat a 82 year old this morning and pt states she is not sure if it was stress or not sleeping or drop in med. PCP decreased Amlodopine to 5mg .  See prior phone meds amlodipine was cut down to try to help swollen ankles and perhaps help with somewhat low blood pressure patient states now blood pressure simply is too high.  Also pulses risen somewhat.  Notes mild headache at times  Has cut down amlodipine     Review of Systems Blood pressure on repeat similar levelNo headache, no major weight loss or weight gain, no chest pain no back pain abdominal pain no change in bowel habits complete ROS otherwise negative     Objective:   Physical Exam  Alert vitals stable, NAD. Blood pressure good on repeat. HEENT normal. Lungs clear. Heart regular rate and rhythm. Blood pressure on repeat 152/90 both      Assessment & Plan:  Impression hypertensio discussed at length.  Will increasen, back up to 10 mg.  Rationale discussed.  Warning signs discussed.  Patient to see cardiologist next month and Dr. Nicki Reaper in 6 months

## 2018-01-22 NOTE — Telephone Encounter (Signed)
Patient seen Dr. Nicki Reaper on 01/16/18.  She said her blood pressure was low at the time, so he cut her Amlodipine in half.  She said today its running 160/76 and she isn't feeling well.  She wants to know what Dr. Nicki Reaper recommends?

## 2018-01-22 NOTE — Telephone Encounter (Signed)
Spoke with Dr.Steve regarding this and per Dr.Steve continue current dose and call in the morning with BP reading. Pt verbalized understanding.

## 2018-01-23 DIAGNOSIS — B351 Tinea unguium: Secondary | ICD-10-CM | POA: Diagnosis not present

## 2018-01-24 ENCOUNTER — Telehealth: Payer: Self-pay | Admitting: Family Medicine

## 2018-01-24 NOTE — Telephone Encounter (Signed)
Recently BP up 180/90 No CP or SOB or Ha meds all the same including amlodipine 10 mg daily Pt informed to increase hydralazine New dose 10 mg one tid If bp too low go back to bid Give [place] update Monday sooner if issues Pt aware of all

## 2018-01-26 ENCOUNTER — Telehealth: Payer: Self-pay | Admitting: Family Medicine

## 2018-01-26 NOTE — Telephone Encounter (Signed)
Glad to hear this.

## 2018-01-26 NOTE — Telephone Encounter (Signed)
Patient wanted to let Dr. Nicki Reaper know that she is doing much better since the weekend with her blood pressure.

## 2018-01-27 ENCOUNTER — Emergency Department (HOSPITAL_COMMUNITY): Payer: Medicare Other

## 2018-01-27 ENCOUNTER — Telehealth: Payer: Self-pay | Admitting: Family Medicine

## 2018-01-27 ENCOUNTER — Encounter (HOSPITAL_COMMUNITY): Payer: Self-pay | Admitting: Emergency Medicine

## 2018-01-27 ENCOUNTER — Emergency Department (HOSPITAL_COMMUNITY)
Admission: EM | Admit: 2018-01-27 | Discharge: 2018-01-27 | Disposition: A | Discharge status: Home / Self Care | Payer: Medicare Other | Attending: Emergency Medicine | Admitting: Emergency Medicine

## 2018-01-27 DIAGNOSIS — Z7984 Long term (current) use of oral hypoglycemic drugs: Secondary | ICD-10-CM | POA: Diagnosis not present

## 2018-01-27 DIAGNOSIS — I10 Essential (primary) hypertension: Secondary | ICD-10-CM | POA: Insufficient documentation

## 2018-01-27 DIAGNOSIS — F419 Anxiety disorder, unspecified: Secondary | ICD-10-CM | POA: Diagnosis not present

## 2018-01-27 DIAGNOSIS — E119 Type 2 diabetes mellitus without complications: Secondary | ICD-10-CM | POA: Insufficient documentation

## 2018-01-27 DIAGNOSIS — R200 Anesthesia of skin: Secondary | ICD-10-CM

## 2018-01-27 DIAGNOSIS — Z79899 Other long term (current) drug therapy: Secondary | ICD-10-CM | POA: Diagnosis not present

## 2018-01-27 DIAGNOSIS — R0602 Shortness of breath: Secondary | ICD-10-CM

## 2018-01-27 LAB — COMPREHENSIVE METABOLIC PANEL
ALBUMIN: 4 g/dL (ref 3.5–5.0)
ALT: 12 U/L (ref 0–44)
ANION GAP: 14 (ref 5–15)
AST: 17 U/L (ref 15–41)
Alkaline Phosphatase: 71 U/L (ref 38–126)
BILIRUBIN TOTAL: 0.9 mg/dL (ref 0.3–1.2)
BUN: 17 mg/dL (ref 8–23)
CALCIUM: 9.6 mg/dL (ref 8.9–10.3)
CO2: 25 mmol/L (ref 22–32)
Chloride: 99 mmol/L (ref 98–111)
Creatinine, Ser: 0.8 mg/dL (ref 0.44–1.00)
GFR calc Af Amer: >60 mL/min (ref >60)
GFR calc non Af Amer: >60 mL/min (ref >60)
GLUCOSE: 114 mg/dL — AB (ref 70–99)
Potassium: 4.4 mmol/L (ref 3.5–5.1)
Sodium: 138 mmol/L (ref 135–145)
TOTAL PROTEIN: 7.2 g/dL (ref 6.5–8.1)

## 2018-01-27 LAB — CBC WITH DIFFERENTIAL/PLATELET
BASOS PCT: 0 %
Basophils Absolute: 0 10*3/uL (ref 0.0–0.1)
Eosinophils Absolute: 0.1 10*3/uL (ref 0.0–0.7)
Eosinophils Relative: 1 %
HEMATOCRIT: 37.5 % (ref 36.0–46.0)
HEMOGLOBIN: 12.1 g/dL (ref 12.0–15.0)
LYMPHS ABS: 2.2 10*3/uL (ref 0.7–4.0)
LYMPHS PCT: 26 %
MCH: 28 pg (ref 26.0–34.0)
MCHC: 32.3 g/dL (ref 30.0–36.0)
MCV: 86.8 fL (ref 78.0–100.0)
MONO ABS: 0.6 10*3/uL (ref 0.1–1.0)
MONOS PCT: 8 %
NEUTROS ABS: 5.3 10*3/uL (ref 1.7–7.7)
NEUTROS PCT: 65 %
Platelets: 286 10*3/uL (ref 150–400)
RBC: 4.32 MIL/uL (ref 3.87–5.11)
RDW: 14 % (ref 11.5–15.5)
WBC: 8.2 10*3/uL (ref 4.0–10.5)

## 2018-01-27 LAB — TROPONIN I: Troponin I: <0.03 ng/mL (ref <0.03)

## 2018-01-27 MED ORDER — HYDROXYZINE HCL 25 MG PO TABS
25.0000 mg | ORAL_TABLET | Freq: Once | ORAL | Status: AC
  Administered 2018-01-27: 25 mg via ORAL
  Filled 2018-01-27: qty 1

## 2018-01-27 MED ORDER — TOBRAMYCIN 0.3 % OP SOLN: OPHTHALMIC | 0 refills | Status: DC

## 2018-01-27 MED ORDER — HYDROXYZINE HCL 25 MG PO TABS: 25.0000 mg | ORAL_TABLET | Freq: Four times a day (QID) | ORAL | 0 refills | Status: DC | PRN

## 2018-01-27 NOTE — ED Provider Notes (Addendum)
[facility] EMERGENCY DEPARTMENT Provider Note   CSN: [id number] Arrival date & time: 01/27/18  1416     History   Chief Complaint Chief Complaint  Patient presents with  . Numbness    HPI [name] is a 84 y.o. female.  Patient states that she has had some numbness in her arm she is felt anxious and her blood pressures been elevated.  The history is provided by the patient. No language interpreter was used.  Illness  This is a recurrent problem. The current episode started 3 to 5 hours ago. The problem occurs constantly. The problem has not changed since onset.Pertinent negatives include no chest pain, no abdominal pain and no headaches. Nothing aggravates the symptoms. Nothing relieves the symptoms. She has tried nothing for the symptoms. The treatment provided no relief.    Past Medical History:  Diagnosis Date  . Diabetes mellitus without complication ([place])   . Hypertension     Patient Active Problem List   Diagnosis Date Noted  . Hyperlipidemia associated with type 2 diabetes mellitus ([place]) 10/02/2016  . Pedal edema 10/02/2016  . Insomnia 10/02/2016  . SBO (small bowel obstruction) ([place]) 07/02/2016  . Dyspnea on exertion 02/24/2016  . Other fatigue   . E-coli UTI 06/18/2015  . Partial small bowel obstruction ([place]) 06/15/2015  . Ileitis, regional, with intestinal obstruction 06/15/2015  . Leukocytosis 12/16/2014  . Nausea and vomiting 12/15/2014  . HLD (hyperlipidemia) 12/15/2014  . Essential hypertension 12/15/2014  . Controlled type 2 diabetes mellitus without complication, without long-term current use of insulin ([place]) 02/15/2011    Past Surgical History:  Procedure Laterality Date  . ABDOMINAL HYSTERECTOMY    . APPENDECTOMY    . BREAST REDUCTION SURGERY    . CARDIAC CATHETERIZATION    . COLON SURGERY Left    Hemicolectomy due to diverticulitis  . EYE SURGERY  03/30/2009   cataract  . KNEE SURGERY    . LAPAROSCOPIC INCISIONAL / UMBILICAL /  VENTRAL HERNIA REPAIR  02/23/2007   . REDUCTION MAMMAPLASTY Bilateral 2001  . REFRACTIVE SURGERY       OB History   None      Home Medications    Prior to Admission medications   Medication Sig Start Date End Date Taking? Authorizing Provider  acetaminophen (TYLENOL) 500 MG tablet Take 500-1,000 mg by mouth every 6 (six) hours as needed for mild pain or moderate pain.   Yes [provider]  amLODipine (NORVASC) 10 MG tablet Take one tablet daily Patient taking differently: Take 10 mg by mouth daily.  01/22/18  Yes Mikey Kirschner, MD  beta carotene w/minerals (OCUVITE) tablet Take 1 tablet by mouth daily.   Yes [provider]  Cyanocobalamin 2500 MCG TABS Take 2,500 mcg by mouth daily.    Yes [provider]  hydrALAZINE (APRESOLINE) 10 MG tablet Take 1 tablet (10 mg total) by mouth 2 (two) times daily. One bid Patient taking differently: Take 10 mg by mouth 3 (three) times daily.  10/20/17  Yes Luking, Elayne Snare, MD  indapamide (LOZOL) 2.5 MG tablet 1 qam Patient taking differently: Take 2.5 mg by mouth every morning.  01/16/18  Yes Kathyrn Drown, MD  losartan (COZAAR) 100 MG tablet TAKE 1 TABLET BY MOUTH ONCE DAILY 11/27/17  Yes Luking, Scott A, MD  metFORMIN (GLUCOPHAGE) 1000 MG tablet TAKE 1 TABLET BY MOUTH TWICE DAILY 09/22/17  Yes Luking, Scott A, MD  polyvinyl alcohol (LIQUIFILM TEARS) 1.4 % ophthalmic solution Place  1 drop into both eyes 3 (three) times daily.    Yes [provider]  hydrOXYzine (ATARAX/VISTARIL) 25 MG tablet Take 1 tablet (25 mg total) by mouth every 6 (six) hours as needed for anxiety. 01/27/18   Milton Ferguson, MD  tobramycin (TOBREX) 0.3 % ophthalmic solution Put 1 drop in the left eye every 4 hours while awake. 01/27/18   Milton Ferguson, MD    Family History Family History  Problem Relation Age of Onset  . Hypertension Father        kidney  . Stroke Father   . Cancer Father   . Cancer Mother        ovaian  .  Diabetes Brother   . Hypertension Sister     Social History Social History   Tobacco Use  . Smoking status: Never Smoker  . Smokeless tobacco: Never Used  Substance Use Topics  . Alcohol use: No  . Drug use: No     Allergies   Phenergan [promethazine hcl]; Statins; Trazodone and nefazodone; and Prednisone   Review of Systems Review of Systems  Constitutional: Negative for appetite change and fatigue.  HENT: Negative for congestion, ear discharge and sinus pressure.   Eyes: Negative for discharge.  Respiratory: Negative for cough.   Cardiovascular: Negative for chest pain.  Gastrointestinal: Negative for abdominal pain and diarrhea.  Genitourinary: Negative for frequency and hematuria.  Musculoskeletal: Negative for back pain.  Skin: Negative for rash.  Neurological: Negative for seizures and headaches.  Psychiatric/Behavioral: Positive for agitation. Negative for hallucinations.     Physical Exam Updated Vital Signs BP (!) 151/52   Pulse 63   Temp (!) 97.5 F (36.4 C) (Oral)   Resp (!) 26   Ht 5\' 8"  (1.727 m)   Wt 75.3 kg (166 lb)   SpO2 99%   BMI 25.24 kg/m   Physical Exam  Constitutional: She is oriented to person, place, and time. She appears well-developed.  HENT:  Head: Normocephalic.  Discharge left eye consistent with conjunctivitis  Eyes: Conjunctivae and EOM are normal. No scleral icterus.  Neck: Neck supple. No thyromegaly present.  Cardiovascular: Normal rate and regular rhythm. Exam reveals no gallop and no friction rub.  No murmur heard. Pulmonary/Chest: No stridor. She has no wheezes. She has no rales. She exhibits no tenderness.  Abdominal: She exhibits no distension. There is no tenderness. There is no rebound.  Musculoskeletal: Normal range of motion. She exhibits no edema.  Lymphadenopathy:    She has no cervical adenopathy.  Neurological: She is oriented to person, place, and time. She exhibits normal muscle tone. Coordination normal.    Skin: No rash noted. No erythema.  Psychiatric: She has a normal mood and affect. Her behavior is normal.     ED Treatments / Results  Labs (all labs ordered are listed, but only abnormal results are displayed) Labs Reviewed  COMPREHENSIVE METABOLIC PANEL - Abnormal; Notable for the following components:      Result Value   Glucose, Bld 114 (*)    All other components within normal limits  CBC WITH DIFFERENTIAL/PLATELET  TROPONIN I    EKG.ekg  None  EKG Interpretation  Date/Time:    Ventricular Rate:    PR Interval:    QRS Duration:   QT Interval:    QTC Calculation:   R Axis:     Text Interpretation:         Radiology Dg Chest 1 View  Result Date: 01/27/2018 CLINICAL DATA:  Short  of breath EXAM: CHEST  1 VIEW COMPARISON:  05/13/2017 FINDINGS: Cardiomegaly with mild vascular congestion. No focal airspace disease or effusion. Aortic atherosclerosis. No pneumothorax. IMPRESSION: No active disease.  Stable cardiomegaly. Electronically Signed   By: Donavan Foil M.D.   On: 01/27/2018 15:58    Procedures Procedures (including critical care time)  Medications Ordered in ED Medications  hydrOXYzine (ATARAX/VISTARIL) tablet 25 mg (has no administration in time range)     Initial Impression / Assessment and Plan / ED Course  I have reviewed the triage vital signs and the nursing notes.  Pertinent labs & imaging results that were available during my care of the patient were reviewed by me and considered in my medical decision making (see chart for details).     Labs unremarkable including CBC chemistries troponin x-ray.  Patient improved with some Vistaril for anxiety.  Her blood pressure remained normal.  Patient does have a conjunctivitis in the left eye.  She will follow-up with her family doctor and is treated with some Vistaril for anxiety and Tobrex  Final Clinical Impressions(s) / ED Diagnoses   Final diagnoses:  Numbness  Anxiety    ED Discharge Orders         Ordered    tobramycin (TOBREX) 0.3 % ophthalmic solution     01/27/18 2030    hydrOXYzine (ATARAX/VISTARIL) 25 MG tablet  Every 6 hours PRN     01/27/18 2030       Milton Ferguson, MD 01/27/18 2034    Milton Ferguson, MD 02/12/18 1317

## 2018-01-27 NOTE — Telephone Encounter (Signed)
The patient could take the hydralazine 3 times daily-instead of twice daily and do a follow-up visit tomorrow if she feels that her pressure is doing better and she is not having an issue if she is having a bad headache or feeling horrible I would recommend being checked today at the ER

## 2018-01-27 NOTE — ED Notes (Signed)
[place] MD made aware of patient and condition. No orders given at this time

## 2018-01-27 NOTE — ED Triage Notes (Signed)
Patient complaining of right hand numbness and "burning" in her chest since 1200 today.

## 2018-01-27 NOTE — Telephone Encounter (Signed)
Left message to return call 

## 2018-01-27 NOTE — Discharge Instructions (Addendum)
Follow-up with your family doctor next week have him check your blood pressure and your conjunctivitis

## 2018-01-27 NOTE — Telephone Encounter (Signed)
Pt contacted office to inform us that BP was 171/78 at lunch and her pulse was 110. States she is feeling bad. She has been laying down and that is not helping. Pt is taking Hydralazine twice daily. States she took Hydralazine this morning along with other medication and the second one at lunch.  Pt advised to go to ED due to no available appts today. Pt verbalized understanding.

## 2018-01-28 NOTE — Telephone Encounter (Signed)
Discussed with pt. Pt verbalized understanding.  °

## 2018-01-28 NOTE — Telephone Encounter (Signed)
[name] Please clarify with the patient the following Hydroxyzine may be used when necessary for anxiousness but no more than twice per day caution drowsiness Hydralazine is her blood pressure medicine and she was instructed to take it twice daily but may use it 3 times daily if blood pressure goes up She should definitely keep her appointment next week with cardiology Follow-up here Thursday or Friday if problems

## 2018-01-28 NOTE — Telephone Encounter (Signed)
May use hydroxyzine when necessary but I would not use it frequently because of potential drowsiness We can certainly recheck her Thursday or Friday if she so desires Otherwise keep appointment with cardiology

## 2018-01-28 NOTE — Telephone Encounter (Signed)
Pt states she is feeling much better today. bp was good today when she checked. She wanted to know what you thought about her taking hydroxzine for anxiety. Has appt tues with cardiology

## 2018-01-28 NOTE — Telephone Encounter (Signed)
Contacted patient and informed her to only take hydralazine as needed due to dizziness. Pt verbalized understanding. Pt then stated that her hand become numb yesterday. Pt states she spent the night with her son when she arrived home from ED.  Getting harder and harder to be by self. Being around people she slept better than ever. Pt states she is going to see how she feels today and tomorrow.

## 2018-01-30 DIAGNOSIS — H1033 Unspecified acute conjunctivitis, bilateral: Secondary | ICD-10-CM | POA: Diagnosis not present

## 2018-02-02 NOTE — Progress Notes (Signed)
Cardiology Office Note    Date:  02/03/2018   ID:  [name], DOB July 03, 1931, MRN [id number]  PCP:  Kathyrn Drown, MD  Cardiologist: Jenkins Rouge, MD    Chief Complaint  Patient presents with  . Follow-up    Annual Visit    History of Present Illness:    [name] is a 84 y.o. female with past medical history of HTN, HLD, and Type II DM who presents to the office today for one-year follow-up.  She was last examined by Dr. Johnsie Cancel in 01/2017 and reported overall doing well from a cardiac perspective at that time. She had fallen a few weeks prior to her visit and required stitches over her left eye. Was continued on her current medication regimen.   Was recently evaluated at [facility] ED on 01/27/2018 for right hand numbness and a burning sensation along her chest. Reported BP had been significantly elevated at home but this was improved to 151/52 while in the ED. Initial troponin was negative and her EKG showed no acute ischemic changes. Was noted to have conjunctivitis along her left eye and she was started on Tobrex.  In talking with the patient today, she reports overall doing well since her visit from last year. She does walk approximately 1 to 1.5 miles per day and denies any anginal symptoms with this. No recent dyspnea on exertion, chest pain, palpitations, orthopnea, or PND. She does develop occasional swelling along her left ankle but says this resolves overnight.  She reports overall feeling very lonely during the day as she lives by herself. She has considered moving to [facility] for increased socialization. She lives in [place] and has gone to the [facility] for daily activities but says this is a 12-mile trip one way and she does not like driving routinely.   Past Medical History:  Diagnosis Date  . Diabetes mellitus without complication ([place])   . Hyperlipidemia    a. intolerant to statins.   . Hypertension     Past Surgical  History:  Procedure Laterality Date  . ABDOMINAL HYSTERECTOMY    . APPENDECTOMY    . BREAST REDUCTION SURGERY    . CARDIAC CATHETERIZATION    . COLON SURGERY Left    Hemicolectomy due to diverticulitis  . EYE SURGERY  03/30/2009   cataract  . KNEE SURGERY    . LAPAROSCOPIC INCISIONAL / UMBILICAL / VENTRAL HERNIA REPAIR  02/23/2007   . REDUCTION MAMMAPLASTY Bilateral 2001  . REFRACTIVE SURGERY      Current Medications: Outpatient Medications Prior to Visit  Medication Sig Dispense Refill  . acetaminophen (TYLENOL) 500 MG tablet Take 500-1,000 mg by mouth every 6 (six) hours as needed for mild pain or moderate pain.    Marland Kitchen amLODipine (NORVASC) 10 MG tablet Take one tablet daily (Patient taking differently: Take 10 mg by mouth daily. ) 90 tablet 1  . beta carotene w/minerals (OCUVITE) tablet Take 1 tablet by mouth daily.    . Cyanocobalamin 2500 MCG TABS Take 2,500 mcg by mouth daily.     . hydrALAZINE (APRESOLINE) 10 MG tablet Take 1 tablet (10 mg total) by mouth 2 (two) times daily. One bid (Patient taking differently: Take 10 mg by mouth 3 (three) times daily. ) 60 tablet 5  . hydrOXYzine (ATARAX/VISTARIL) 25 MG tablet Take 1 tablet (25 mg total) by mouth every 6 (six) hours as needed for anxiety. 20 tablet 0  . indapamide (LOZOL) 2.5 MG  tablet 1 qam (Patient taking differently: Take 2.5 mg by mouth every morning. ) 90 tablet 3  . losartan (COZAAR) 100 MG tablet TAKE 1 TABLET BY MOUTH ONCE DAILY 90 tablet 1  . metFORMIN (GLUCOPHAGE) 1000 MG tablet TAKE 1 TABLET BY MOUTH TWICE DAILY 180 tablet 1  . polyvinyl alcohol (LIQUIFILM TEARS) 1.4 % ophthalmic solution Place 1 drop into both eyes 3 (three) times daily.     Marland Kitchen tobramycin (TOBREX) 0.3 % ophthalmic solution Put 1 drop in the left eye every 4 hours while awake. 5 mL 0  . tobramycin-dexamethasone (TOBRADEX) ophthalmic solution Place 1 drop into both eyes every 4 (four) hours while awake.      No facility-administered medications prior  to visit.      Allergies:   Phenergan [promethazine hcl]; Statins; Trazodone and nefazodone; and Prednisone   Social History   Socioeconomic History  . Marital status: Widowed    Spouse name: Not on file  . Number of children: Not on file  . Years of education: Not on file  . Highest education level: Not on file  Occupational History  . Not on file  Social Needs  . Financial resource strain: Not on file  . Food insecurity:    Worry: Not on file    Inability: Not on file  . Transportation needs:    Medical: Not on file    Non-medical: Not on file  Tobacco Use  . Smoking status: Never Smoker  . Smokeless tobacco: Never Used  Substance and Sexual Activity  . Alcohol use: No  . Drug use: No  . Sexual activity: Never    Birth control/protection: Abstinence  Lifestyle  . Physical activity:    Days per week: Not on file    Minutes per session: Not on file  . Stress: Not on file  Relationships  . Social connections:    Talks on phone: Not on file    Gets together: Not on file    Attends religious service: Not on file    Active member of club or organization: Not on file    Attends meetings of clubs or organizations: Not on file    Relationship status: Not on file  Other Topics Concern  . Not on file  Social History Narrative  . Not on file     Family History:  The patient's family history includes Cancer in her father and mother; Diabetes in her brother; Hypertension in her father and sister; Stroke in her father.   Review of Systems:   Please see the history of present illness.     General:  No chills, fever, night sweats or weight changes. Positive for eye pain (improving).  Cardiovascular:  No chest pain, dyspnea on exertion, edema, orthopnea, palpitations, paroxysmal nocturnal dyspnea. Dermatological: No rash, lesions/masses Respiratory: No cough, dyspnea Urologic: No hematuria, dysuria Abdominal:   No nausea, vomiting, diarrhea, bright red blood per rectum,  melena, or hematemesis Neurologic:  No visual changes, wkns, changes in mental status.  All other systems reviewed and are otherwise negative except as noted above.   Physical Exam:    VS:  BP 136/62   Pulse 68   Ht 5\' 8"  (1.727 m)   Wt 168 lb (76.2 kg)   SpO2 95%   BMI 25.54 kg/m    General: Well developed, elderly Caucasian female appearing in no acute distress. Head: Normocephalic, atraumatic, sclera non-icteric, no xanthomas, nares are without discharge.  Neck: No carotid bruits. JVD not elevated.  Lungs: Respirations regular and unlabored, without wheezes or rales.  Heart: Regular rate and rhythm. No S3 or S4.  No murmur, no rubs, or gallops appreciated. Abdomen: Soft, non-tender, non-distended with normoactive bowel sounds. No hepatomegaly. No rebound/guarding. No obvious abdominal masses. Msk:  Strength and tone appear normal for age. No joint deformities or effusions. Extremities: No clubbing or cyanosis. Trace ankle edema bilaterally.  Distal pedal pulses are 2+ bilaterally. Neuro: Alert and oriented X 3. Moves all extremities spontaneously. No focal deficits noted. Psych:  Responds to questions appropriately with a normal affect. Skin: No rashes or lesions noted  Wt Readings from Last 3 Encounters:  02/03/18 168 lb (76.2 kg)  01/27/18 166 lb (75.3 kg)  01/22/18 166 lb (75.3 kg)     Studies/Labs Reviewed:   EKG:  EKG is not ordered today.  Recent Labs: 01/27/2018: ALT 12; BUN 17; Creatinine, Ser 0.80; Hemoglobin 12.1; Platelets 286; Potassium 4.4; Sodium 138   Lipid Panel    Component Value Date/Time   CHOL 156 12/27/2017 0803   TRIG 188 (H) 12/27/2017 0803   HDL 36 (L) 12/27/2017 0803   CHOLHDL 4.3 12/27/2017 0803   CHOLHDL 3.3 07/05/2014 0907   VLDL 22 07/05/2014 0907   LDLCALC 82 12/27/2017 0803    Additional studies/ records that were reviewed today include:   Echocardiogram: 01/2016 Study Conclusions  - Left ventricle: The cavity size was normal.  Systolic function was   normal. The estimated ejection fraction was in the range of 55%   to 60%. Wall motion was normal; there were no regional wall   motion abnormalities. Doppler parameters are consistent with   abnormal left ventricular relaxation (grade 1 diastolic   dysfunction). - Pericardium, extracardiac: A small pericardial effusion was   identified.  Assessment:    1. Essential hypertension   2. Hyperlipidemia, unspecified hyperlipidemia type   3. Palpitations   4. Controlled type 2 diabetes mellitus without complication, without long-term current use of insulin ([place])      Plan:   In order of problems listed above:  1. HTN - BP is well-controlled at 136/62 during today's visit. She was previously having issues with HTN but Hydralazine was recently titrated from 10 mg BID to 10mg  TID and she reports improvement in her readings. I recommended she continue to follow BP in the ambulatory setting as she is on a smaller dose of Hydralazine and this could be further titrated if indicated. Remains on Amlodipine 10mg  daily and Losartan 100mg  daily.  2. HLD - FLP in 11/2017 showed total cholesterol of 156, HDL 36, Triglycerides 188, and LDL 82. Diet-controlled as she has been intolerant to multiple statins in the past.   3. Palpitations - she denies any recent symptoms. No indication for further testing at this time.  4. Type 2 DM - followed by PCP. Most recent Hgb A1c in 11/2017 was at 6.3. - remains on Metformin 1000mg  BID.     Medication Adjustments/Labs and Tests Ordered: Current medicines are reviewed at length with the patient today.  Concerns regarding medicines are outlined above.  Medication changes, Labs and Tests ordered today are listed in the Patient Instructions below. Patient Instructions  Your physician wants you to follow-up in: 1 year with Dr.Nishan  You will receive a reminder letter in the mail two months in advance. If you don't receive a letter, please  call our office to schedule the follow-up appointment.   Your physician recommends that you continue on your current medications as  directed. Please refer to the Current Medication list given to you today.  If you need a refill on your cardiac medications before your next appointment, please call your pharmacy.  No labs or tests today  Thank you for choosing [facility] !         Signed, Erma Heritage, PA-C  02/03/2018 9:39 PM    [facility] [address] [place], [place] [address] Phone: [phone]

## 2018-02-03 ENCOUNTER — Encounter: Payer: Self-pay | Admitting: Student

## 2018-02-03 ENCOUNTER — Ambulatory Visit (INDEPENDENT_AMBULATORY_CARE_PROVIDER_SITE_OTHER): Payer: Medicare Other | Admitting: Student

## 2018-02-03 VITALS — BP 136/62 | HR 68 | Ht 68.0 in | Wt 168.0 lb

## 2018-02-03 DIAGNOSIS — E119 Type 2 diabetes mellitus without complications: Secondary | ICD-10-CM | POA: Diagnosis not present

## 2018-02-03 DIAGNOSIS — E785 Hyperlipidemia, unspecified: Secondary | ICD-10-CM

## 2018-02-03 DIAGNOSIS — R002 Palpitations: Secondary | ICD-10-CM

## 2018-02-03 DIAGNOSIS — I10 Essential (primary) hypertension: Secondary | ICD-10-CM

## 2018-02-03 NOTE — Patient Instructions (Addendum)
Your physician wants you to follow-up in: 1 year with Dr.Nishan  You will receive a reminder letter in the mail two months in advance. If you don't receive a letter, please call our office to schedule the follow-up appointment.   Your physician recommends that you continue on your current medications as directed. Please refer to the Current Medication list given to you today.    If you need a refill on your cardiac medications before your next appointment, please call your pharmacy.      No labs or tests today       Thank you for choosing [facility] !

## 2018-02-09 DIAGNOSIS — H1033 Unspecified acute conjunctivitis, bilateral: Secondary | ICD-10-CM | POA: Diagnosis not present

## 2018-02-26 ENCOUNTER — Other Ambulatory Visit (HOSPITAL_COMMUNITY)
Admission: RE | Admit: 2018-02-26 | Discharge: 2018-02-26 | Disposition: A | Discharge status: Home / Self Care | Payer: Medicare Other | Source: Ambulatory Visit | Attending: Family Medicine | Admitting: Family Medicine

## 2018-02-26 ENCOUNTER — Encounter: Payer: Self-pay | Admitting: Family Medicine

## 2018-02-26 ENCOUNTER — Ambulatory Visit (INDEPENDENT_AMBULATORY_CARE_PROVIDER_SITE_OTHER): Payer: Medicare Other | Admitting: Family Medicine

## 2018-02-26 VITALS — BP 138/44 | Ht 68.0 in | Wt 166.0 lb

## 2018-02-26 DIAGNOSIS — F411 Generalized anxiety disorder: Secondary | ICD-10-CM

## 2018-02-26 DIAGNOSIS — R197 Diarrhea, unspecified: Secondary | ICD-10-CM | POA: Diagnosis not present

## 2018-02-26 DIAGNOSIS — R109 Unspecified abdominal pain: Secondary | ICD-10-CM | POA: Insufficient documentation

## 2018-02-26 DIAGNOSIS — R5383 Other fatigue: Secondary | ICD-10-CM | POA: Diagnosis not present

## 2018-02-26 LAB — HEPATIC FUNCTION PANEL
ALBUMIN: 3.9 g/dL (ref 3.5–5.0)
ALT: 12 U/L (ref 0–44)
AST: 16 U/L (ref 15–41)
Alkaline Phosphatase: 84 U/L (ref 38–126)
Bilirubin, Direct: 0.1 mg/dL (ref 0.0–0.2)
Indirect Bilirubin: 0.8 mg/dL (ref 0.3–0.9)
TOTAL PROTEIN: 7.3 g/dL (ref 6.5–8.1)
Total Bilirubin: 0.9 mg/dL (ref 0.3–1.2)

## 2018-02-26 LAB — BASIC METABOLIC PANEL
ANION GAP: 11 (ref 5–15)
BUN: 16 mg/dL (ref 8–23)
CO2: 28 mmol/L (ref 22–32)
Calcium: 9.6 mg/dL (ref 8.9–10.3)
Chloride: 98 mmol/L (ref 98–111)
Creatinine, Ser: 0.82 mg/dL (ref 0.44–1.00)
GFR calc non Af Amer: >60 mL/min (ref >60)
Glucose, Bld: 100 mg/dL — ABNORMAL HIGH (ref 70–99)
POTASSIUM: 3.8 mmol/L (ref 3.5–5.1)
SODIUM: 137 mmol/L (ref 135–145)

## 2018-02-26 LAB — CBC WITH DIFFERENTIAL/PLATELET
BASOS PCT: 0 %
Basophils Absolute: 0 10*3/uL (ref 0.0–0.1)
EOS ABS: 0.2 10*3/uL (ref 0.0–0.7)
EOS PCT: 2 %
HCT: 38.8 % (ref 36.0–46.0)
HEMOGLOBIN: 12.4 g/dL (ref 12.0–15.0)
Lymphocytes Relative: 29 %
Lymphs Abs: 2.5 10*3/uL (ref 0.7–4.0)
MCH: 27.9 pg (ref 26.0–34.0)
MCHC: 32 g/dL (ref 30.0–36.0)
MCV: 87.2 fL (ref 78.0–100.0)
MONO ABS: 0.7 10*3/uL (ref 0.1–1.0)
Monocytes Relative: 8 %
NEUTROS PCT: 61 %
Neutro Abs: 5.4 10*3/uL (ref 1.7–7.7)
PLATELETS: 297 10*3/uL (ref 150–400)
RBC: 4.45 MIL/uL (ref 3.87–5.11)
RDW: 13.8 % (ref 11.5–15.5)
WBC: 8.8 10*3/uL (ref 4.0–10.5)

## 2018-02-26 MED ORDER — ESCITALOPRAM OXALATE 10 MG PO TABS: ORAL_TABLET | ORAL | 5 refills | Status: DC

## 2018-02-26 NOTE — Progress Notes (Signed)
   Subjective:    Patient ID: [name], female    DOB: 1931-10-10, 84 y.o.   MRN: [id number]  HPI Patient is here today with complaints of diarrhea and abd pain off and on for the last two weeks,   she has been taking anti diarrhea medication   Which helped  Vertigo now much better.  also complaining of vertigo has been taking meclizine for this and states today it is much better. No appetite, went on a bland die  Had some lomotil left ovr, too a cpuple dose  No fever  No vomiting   t.Also mentions feeling anxious. Gave pt a PHQ 9.  Pt s p colectomy , no major challenges with diarrhea since then.  In some ways that diarrhea has calm down.  Always feeling uptight and anxious, blood pressure goes up, gets to feeling very anxious,   Patient feels she needs to take something for her anxiety. Finds herself worrying all the time.    Review of Systems  No dysuria fever no chills no vomiting no high fevers    Objective:   Physical Exam  Alert active good hydration.  Affect slightly anxious.  HEENT normal.  Lungs clear.  Heart regular rate and rhythm.  Abdomen diffuse excellent bowel sounds.  No discrete tenderness.      Assessment & Plan:  Impression worsening anxiety with element of depression we discussed.  Will cover with Lexapro.  Rationale discussed.  Symptom care for diarrhea discussed.  Signs for diarrhea also discussed  Greater than 50% of this 25 minute face to face visit was spent in counseling and discussion and coordination of care regarding the above diagnosis/diagnosies

## 2018-02-26 NOTE — Patient Instructions (Signed)
Panic Attack A panic attack is a sudden episode of severe anxiety, fear, or discomfort that causes physical and emotional symptoms. The attack may be in response to something frightening, or it may occur for no known reason. Symptoms of a panic attack can be similar to symptoms of a heart attack or stroke. It is important to see your health care provider when you have a panic attack so that these conditions can be ruled out. A panic attack is a symptom of another condition. Most panic attacks go away with treatment of the underlying problem. If you have panic attacks often, you may have a condition called panic disorder. What are the causes? A panic attack may be caused by:  An extreme, life-threatening situation, such as a war or natural disaster.  An anxiety disorder, such as post-traumatic stress disorder.  Depression.  Certain medical conditions, including heart problems, neurological conditions, and infections.  Certain over-the-counter and prescription medicines.  Illegal drugs that increase heart rate and blood pressure, such as methamphetamine.  Alcohol.  Supplements that increase anxiety.  Panic disorder.  What increases the risk? You are more likely to develop this condition if:  You have an anxiety disorder.  You have another mental health condition.  You take certain medicines.  You use alcohol, illegal drugs, or other substances.  You are under extreme stress.  A life event is causing increased feelings of anxiety and depression.  What are the signs or symptoms? A panic attack starts suddenly, usually lasts about 20 minutes, and occurs with one or more of the following:  A pounding heart.  A feeling that your heart is beating irregularly or faster than normal (palpitations).  Sweating.  Trembling or shaking.  Shortness of breath or feeling smothered.  Feeling choked.  Chest pain or discomfort.  Nausea or a strange feeling in your  stomach.  Dizziness, feeling lightheaded, or feeling like you might faint.  Chills or hot flashes.  Numbness or tingling in your lips, hands, or feet.  Feeling confused, or feeling that you are not yourself.  Fear of losing control or being emotionally unstable.  Fear of dying.  How is this diagnosed? A panic attack is diagnosed with an assessment by your health care provider. During the assessment your health care provider will ask questions about:  Your history of anxiety, depression, and panic attacks.  Your medical history.  Whether you drink alcohol, use illegal drugs, take supplements, or take medicines. Be honest about your substance use.  Your health care provider may also:  Order blood tests or other kinds of tests to rule out serious medical conditions.  Refer you to a mental health professional for further evaluation.  How is this treated? Treatment depends on the cause of the panic attack:  If the cause is a medical problem, your health care provider will either treat that problem or refer you to a specialist.  If the cause is emotional, you may be given anti-anxiety medicines or referred to a counselor. These medicines may reduce how often attacks happen, reduce how severe the attacks are, and lower anxiety.  If the cause is a medicine, your health care provider may tell you to stop the medicine, change your dose, or take a different medicine.  If the cause is a drug, treatment may involve letting the drug wear off and taking medicine to help the drug leave your body or to counteract its effects. Attacks caused by drug abuse may continue even if you stop using   the drug.  Follow these instructions at home:  Take over-the-counter and prescription medicines only as told by your health care provider.  If you feel anxious, limit your caffeine intake.  Take good care of your physical and mental health by: ? Eating a balanced diet that includes plenty of fresh  fruits and vegetables, whole grains, lean meats, and low-fat dairy. ? Getting plenty of rest. Try to get 7-8 hours of uninterrupted sleep each night. ? Exercising regularly. Try to get 30 minutes of physical activity at least 5 days a week. ? Not smoking. Talk to your health care provider if you need help quitting. ? Limiting alcohol intake to no more than 1 drink a day for nonpregnant women and 2 drinks a day for men. One drink equals 12 oz of beer, 5 oz of wine, or 1 oz of hard liquor.  Keep all follow-up visits as told by your health care provider. This is important. Panic attacks may have underlying physical or emotional problems that take time to accurately diagnose. Contact a health care provider if:  Your symptoms do not improve, or they get worse.  You are not able to take your medicine as prescribed because of side effects. Get help right away if:  You have serious thoughts about hurting yourself or others.  You have symptoms of a panic attack. Do not drive yourself to the hospital. Have someone else drive you or call an ambulance. If you ever feel like you may hurt yourself or others, or you have thoughts about taking your own life, get help right away. You can go to your nearest emergency department or call:  Your local emergency services (911 in the [place]  A suicide crisis helpline, such as the [facility] at 971-866-1239. This is open 24 hours a day.  Summary  A panic attack is a sign of a serious health or mental health condition. Get help right away. Do not drive yourself to the hospital. Have someone else drive you or call an ambulance.  Always see a health care provider to have the reasons for the panic attack correctly diagnosed.  If your panic attack was caused by a physical problem, follow your health care provider's suggestions for medicine, referral to a specialist, and lifestyle changes.  If your panic attack was caused by an  emotional problem, follow through with counseling from a qualified mental health specialist.  If you feel like you may hurt yourself or others, call 911 and get help right away. This information is not intended to replace advice given to you by your health care provider. Make sure you discuss any questions you have with your health care provider. Document Released: 06/17/2005 Document Revised: 07/26/2016 Document Reviewed: 07/26/2016 Elsevier Interactive Patient Education  2018 [place]. Generalized Anxiety Disorder, Adult Generalized anxiety disorder (GAD) is a mental health disorder. People with this condition constantly worry about everyday events. Unlike normal anxiety, worry related to GAD is not triggered by a specific event. These worries also do not fade or get better with time. GAD interferes with life functions, including relationships, work, and school. GAD can vary from mild to severe. People with severe GAD can have intense waves of anxiety with physical symptoms (panic attacks). What are the causes? The exact cause of GAD is not known. What increases the risk? This condition is more likely to develop in:  Women.  People who have a family history of anxiety disorders.  People who are very shy.  People who experience very stressful life events, such as the death of a loved one.  People who have a very stressful family environment.  What are the signs or symptoms? People with GAD often worry excessively about many things in their lives, such as their health and family. They may also be overly concerned about:  Doing well at work.  Being on time.  Natural disasters.  Friendships.  Physical symptoms of GAD include:  Fatigue.  Muscle tension or having muscle twitches.  Trembling or feeling shaky.  Being easily startled.  Feeling like your heart is pounding or racing.  Feeling out of breath or like you cannot take a deep breath.  Having trouble falling  asleep or staying asleep.  Sweating.  Nausea, diarrhea, or irritable bowel syndrome (IBS).  Headaches.  Trouble concentrating or remembering facts.  Restlessness.  Irritability.  How is this diagnosed? Your health care provider can diagnose GAD based on your symptoms and medical history. You will also have a physical exam. The health care provider will ask specific questions about your symptoms, including how severe they are, when they started, and if they come and go. Your health care provider may ask you about your use of alcohol or drugs, including prescription medicines. Your health care provider may refer you to a mental health specialist for further evaluation. Your health care provider will do a thorough examination and may perform additional tests to rule out other possible causes of your symptoms. To be diagnosed with GAD, a person must have anxiety that:  Is out of his or her control.  Affects several different aspects of his or her life, such as work and relationships.  Causes distress that makes him or her unable to take part in normal activities.  Includes at least three physical symptoms of GAD, such as restlessness, fatigue, trouble concentrating, irritability, muscle tension, or sleep problems.  Before your health care provider can confirm a diagnosis of GAD, these symptoms must be present more days than they are not, and they must last for six months or longer. How is this treated? The following therapies are usually used to treat GAD:  Medicine. Antidepressant medicine is usually prescribed for long-term daily control. Antianxiety medicines may be added in severe cases, especially when panic attacks occur.  Talk therapy (psychotherapy). Certain types of talk therapy can be helpful in treating GAD by providing support, education, and guidance. Options include: ? Cognitive behavioral therapy (CBT). People learn coping skills and techniques to ease their anxiety. They  learn to identify unrealistic or negative thoughts and behaviors and to replace them with positive ones. ? Acceptance and commitment therapy (ACT). This treatment teaches people how to be mindful as a way to cope with unwanted thoughts and feelings. ? Biofeedback. This process trains you to manage your body's response (physiological response) through breathing techniques and relaxation methods. You will work with a therapist while machines are used to monitor your physical symptoms.  Stress management techniques. These include yoga, meditation, and exercise.  A mental health specialist can help determine which treatment is best for you. Some people see improvement with one type of therapy. However, other people require a combination of therapies. Follow these instructions at home:  Take over-the-counter and prescription medicines only as told by your health care provider.  Try to maintain a normal routine.  Try to anticipate stressful situations and allow extra time to manage them.  Practice any stress management or self-calming techniques as taught by your health care  provider.  Do not punish yourself for setbacks or for not making progress.  Try to recognize your accomplishments, even if they are small.  Keep all follow-up visits as told by your health care provider. This is important. Contact a health care provider if:  Your symptoms do not get better.  Your symptoms get worse.  You have signs of depression, such as: ? A persistently sad, cranky, or irritable mood. ? Loss of enjoyment in activities that used to bring you joy. ? Change in weight or eating. ? Changes in sleeping habits. ? Avoiding friends or family members. ? Loss of energy for normal tasks. ? Feelings of guilt or worthlessness. Get help right away if:  You have serious thoughts about hurting yourself or others. If you ever feel like you may hurt yourself or others, or have thoughts about taking your own life,  get help right away. You can go to your nearest emergency department or call:  Your local emergency services (911 in the [place]  A suicide crisis helpline, such as the [place] at 281-725-9115. This is open 24 hours a day.  Summary  Generalized anxiety disorder (GAD) is a mental health disorder that involves worry that is not triggered by a specific event.  People with GAD often worry excessively about many things in their lives, such as their health and family.  GAD may cause physical symptoms such as restlessness, trouble concentrating, sleep problems, frequent sweating, nausea, diarrhea, headaches, and trembling or muscle twitching.  A mental health specialist can help determine which treatment is best for you. Some people see improvement with one type of therapy. However, other people require a combination of therapies. This information is not intended to replace advice given to you by your health care provider. Make sure you discuss any questions you have with your health care provider. Document Released: 10/12/2012 Document Revised: 05/07/2016 Document Reviewed: 05/07/2016 Elsevier Interactive Patient Education  [employer].

## 2018-02-27 ENCOUNTER — Emergency Department (HOSPITAL_COMMUNITY): Payer: Medicare Other

## 2018-02-27 ENCOUNTER — Emergency Department (HOSPITAL_COMMUNITY)
Admission: EM | Admit: 2018-02-27 | Discharge: 2018-02-27 | Disposition: A | Discharge status: Home / Self Care | Payer: Medicare Other | Attending: Emergency Medicine | Admitting: Emergency Medicine

## 2018-02-27 ENCOUNTER — Encounter (HOSPITAL_COMMUNITY): Payer: Self-pay | Admitting: Emergency Medicine

## 2018-02-27 ENCOUNTER — Other Ambulatory Visit: Payer: Self-pay

## 2018-02-27 DIAGNOSIS — R197 Diarrhea, unspecified: Secondary | ICD-10-CM | POA: Diagnosis not present

## 2018-02-27 DIAGNOSIS — Z7984 Long term (current) use of oral hypoglycemic drugs: Secondary | ICD-10-CM | POA: Insufficient documentation

## 2018-02-27 DIAGNOSIS — I10 Essential (primary) hypertension: Secondary | ICD-10-CM | POA: Insufficient documentation

## 2018-02-27 DIAGNOSIS — R140 Abdominal distension (gaseous): Secondary | ICD-10-CM | POA: Insufficient documentation

## 2018-02-27 DIAGNOSIS — E119 Type 2 diabetes mellitus without complications: Secondary | ICD-10-CM | POA: Diagnosis not present

## 2018-02-27 DIAGNOSIS — R10817 Generalized abdominal tenderness: Secondary | ICD-10-CM | POA: Insufficient documentation

## 2018-02-27 DIAGNOSIS — Z79899 Other long term (current) drug therapy: Secondary | ICD-10-CM | POA: Diagnosis not present

## 2018-02-27 DIAGNOSIS — K8020 Calculus of gallbladder without cholecystitis without obstruction: Secondary | ICD-10-CM | POA: Diagnosis not present

## 2018-02-27 LAB — CBC WITH DIFFERENTIAL/PLATELET
BASOS ABS: 0 10*3/uL (ref 0.0–0.1)
BASOS PCT: 0 %
EOS ABS: 0.1 10*3/uL (ref 0.0–0.7)
Eosinophils Relative: 1 %
HEMATOCRIT: 39.3 % (ref 36.0–46.0)
HEMOGLOBIN: 13 g/dL (ref 12.0–15.0)
Lymphocytes Relative: 17 %
Lymphs Abs: 1.4 10*3/uL (ref 0.7–4.0)
MCH: 28.7 pg (ref 26.0–34.0)
MCHC: 33.1 g/dL (ref 30.0–36.0)
MCV: 86.8 fL (ref 78.0–100.0)
MONOS PCT: 6 %
Monocytes Absolute: 0.5 10*3/uL (ref 0.1–1.0)
NEUTROS ABS: 6 10*3/uL (ref 1.7–7.7)
NEUTROS PCT: 76 %
Platelets: 279 10*3/uL (ref 150–400)
RBC: 4.53 MIL/uL (ref 3.87–5.11)
RDW: 13.8 % (ref 11.5–15.5)
WBC: 8 10*3/uL (ref 4.0–10.5)

## 2018-02-27 LAB — BASIC METABOLIC PANEL
ANION GAP: 12 (ref 5–15)
BUN: 17 mg/dL (ref 8–23)
CHLORIDE: 98 mmol/L (ref 98–111)
CO2: 29 mmol/L (ref 22–32)
Calcium: 10.1 mg/dL (ref 8.9–10.3)
Creatinine, Ser: 0.81 mg/dL (ref 0.44–1.00)
GFR calc non Af Amer: >60 mL/min (ref >60)
Glucose, Bld: 165 mg/dL — ABNORMAL HIGH (ref 70–99)
POTASSIUM: 4.3 mmol/L (ref 3.5–5.1)
SODIUM: 139 mmol/L (ref 135–145)

## 2018-02-27 LAB — C DIFFICILE QUICK SCREEN W PCR REFLEX
C DIFFICILE (CDIFF) TOXIN: NEGATIVE
C DIFFICLE (CDIFF) ANTIGEN: POSITIVE — AB

## 2018-02-27 LAB — CLOSTRIDIUM DIFFICILE BY PCR, REFLEXED: CDIFFPCR: POSITIVE — AB

## 2018-02-27 MED ORDER — IOPAMIDOL (ISOVUE-300) INJECTION 61%
100.0000 mL | Freq: Once | INTRAVENOUS | Status: AC | PRN
  Administered 2018-02-27: 100 mL via INTRAVENOUS

## 2018-02-27 MED ORDER — SODIUM CHLORIDE 0.9 % IV BOLUS
1000.0000 mL | Freq: Once | INTRAVENOUS | Status: AC
  Administered 2018-02-27: 1000 mL via INTRAVENOUS

## 2018-02-27 NOTE — Discharge Instructions (Addendum)
Drink plenty of fluids and follow-up with your family doctor as planned.  Get seen sooner if problems.  Continue using the Lomotil if needed

## 2018-02-27 NOTE — ED Provider Notes (Signed)
[facility] EMERGENCY DEPARTMENT Provider Note   CSN: [id number] Arrival date & time: 02/27/18  0703     History   Chief Complaint Chief Complaint  Patient presents with  . Diarrhea    HPI [name] is a 84 y.o. female.  Patient has had diarrhea for over a week.  Patient states that she was seen in her doctor's office and stool specimens were supposed to be brought to the lab today.  She seems to be getting worse and she called her GI doctor who stated that she may need to get a scan of her abdomen  The history is provided by the patient. No language interpreter was used.  Diarrhea   This is a new problem. The current episode started more than 1 week ago. The problem occurs 5 to 10 times per day. The problem has not changed since onset.The stool consistency is described as watery. There has been no fever. Pertinent negatives include no abdominal pain, no chills, no headaches and no cough. She has tried nothing for the symptoms. The treatment provided no relief. Risk factors: Unknown. Her past medical history does not include irritable bowel syndrome.    Past Medical History:  Diagnosis Date  . Diabetes mellitus without complication ([place])   . Hyperlipidemia    a. intolerant to statins.   . Hypertension     Patient Active Problem List   Diagnosis Date Noted  . Hyperlipidemia associated with type 2 diabetes mellitus ([place]) 10/02/2016  . Pedal edema 10/02/2016  . Insomnia 10/02/2016  . SBO (small bowel obstruction) ([place]) 07/02/2016  . Dyspnea on exertion 02/24/2016  . Other fatigue   . E-coli UTI 06/18/2015  . Partial small bowel obstruction ([place]) 06/15/2015  . Ileitis, regional, with intestinal obstruction 06/15/2015  . Leukocytosis 12/16/2014  . Nausea and vomiting 12/15/2014  . HLD (hyperlipidemia) 12/15/2014  . Essential hypertension 12/15/2014  . Controlled type 2 diabetes mellitus without complication, without long-term current use of insulin ([place]) 02/15/2011      Past Surgical History:  Procedure Laterality Date  . ABDOMINAL HYSTERECTOMY    . APPENDECTOMY    . BREAST REDUCTION SURGERY    . CARDIAC CATHETERIZATION    . COLON SURGERY Left    Hemicolectomy due to diverticulitis  . EYE SURGERY  03/30/2009   cataract  . KNEE SURGERY    . LAPAROSCOPIC INCISIONAL / UMBILICAL / VENTRAL HERNIA REPAIR  02/23/2007   . REDUCTION MAMMAPLASTY Bilateral 2001  . REFRACTIVE SURGERY       OB History   None      Home Medications    Prior to Admission medications   Medication Sig Start Date End Date Taking? Authorizing Provider  acetaminophen (TYLENOL) 500 MG tablet Take 500-1,000 mg by mouth every 6 (six) hours as needed for mild pain or moderate pain.    [provider]  amLODipine (NORVASC) 10 MG tablet Take one tablet daily Patient taking differently: Take 10 mg by mouth daily.  01/22/18   Mikey Kirschner, MD  beta carotene w/minerals (OCUVITE) tablet Take 1 tablet by mouth daily.    [provider]  Cyanocobalamin 2500 MCG TABS Take 2,500 mcg by mouth daily.     [provider]  escitalopram (LEXAPRO) 10 MG tablet Take one tabletpo Q am 02/26/18   Mikey Kirschner, MD  hydrALAZINE (APRESOLINE) 10 MG tablet Take 1 tablet (10 mg total) by mouth 2 (two) times daily. One bid Patient taking differently: Take 10 mg  by mouth 3 (three) times daily.  10/20/17   Kathyrn Drown, MD  hydrOXYzine (ATARAX/VISTARIL) 25 MG tablet Take 1 tablet (25 mg total) by mouth every 6 (six) hours as needed for anxiety. Patient not taking: Reported on 02/26/2018 01/27/18   Milton Ferguson, MD  indapamide (LOZOL) 2.5 MG tablet 1 qam Patient taking differently: Take 2.5 mg by mouth every morning.  01/16/18   Kathyrn Drown, MD  losartan (COZAAR) 100 MG tablet TAKE 1 TABLET BY MOUTH ONCE DAILY 11/27/17   Kathyrn Drown, MD  metFORMIN (GLUCOPHAGE) 1000 MG tablet TAKE 1 TABLET BY MOUTH TWICE DAILY 09/22/17   Kathyrn Drown, MD  polyvinyl alcohol  (LIQUIFILM TEARS) 1.4 % ophthalmic solution Place 1 drop into both eyes 3 (three) times daily.     [provider]  tobramycin (TOBREX) 0.3 % ophthalmic solution Put 1 drop in the left eye every 4 hours while awake. Patient not taking: Reported on 02/26/2018 01/27/18   Milton Ferguson, MD  tobramycin-dexamethasone [facility]) ophthalmic solution Place 1 drop into both eyes every 4 (four) hours while awake.  01/30/18   [provider]    Family History Family History  Problem Relation Age of Onset  . Hypertension Father        kidney  . Stroke Father   . Cancer Father   . Cancer Mother        ovaian  . Diabetes Brother   . Hypertension Sister     Social History Social History   Tobacco Use  . Smoking status: Never Smoker  . Smokeless tobacco: Never Used  Substance Use Topics  . Alcohol use: No  . Drug use: No     Allergies   Phenergan [promethazine hcl]; Statins; Trazodone and nefazodone; and Prednisone   Review of Systems Review of Systems  Constitutional: Negative for appetite change, chills and fatigue.  HENT: Negative for congestion, ear discharge and sinus pressure.   Eyes: Negative for discharge.  Respiratory: Negative for cough.   Cardiovascular: Negative for chest pain.  Gastrointestinal: Positive for diarrhea. Negative for abdominal pain.  Genitourinary: Negative for frequency and hematuria.  Musculoskeletal: Negative for back pain.  Skin: Negative for rash.  Neurological: Negative for seizures and headaches.  Psychiatric/Behavioral: Negative for hallucinations.     Physical Exam Updated Vital Signs BP (!) 139/57   Pulse 67   Temp 98.6 F (37 C) (Oral)   Resp 17   Ht 5\' 8"  (1.727 m)   Wt 75.3 kg   SpO2 97%   BMI 25.24 kg/m   Physical Exam  Constitutional: She is oriented to person, place, and time. She appears well-developed.  HENT:  Head: Normocephalic.  Eyes: Conjunctivae and EOM are normal. No scleral icterus.  Neck: Neck  supple. No thyromegaly present.  Cardiovascular: Normal rate and regular rhythm. Exam reveals no gallop and no friction rub.  No murmur heard. Pulmonary/Chest: No stridor. She has no wheezes. She has no rales. She exhibits no tenderness.  Abdominal: She exhibits no distension. There is tenderness. There is no rebound.  Minimal tenderness throughout  Musculoskeletal: Normal range of motion. She exhibits no edema.  Lymphadenopathy:    She has no cervical adenopathy.  Neurological: She is oriented to person, place, and time. She exhibits normal muscle tone. Coordination normal.  Skin: No rash noted. No erythema.  Psychiatric: She has a normal mood and affect. Her behavior is normal.     ED Treatments / Results  Labs (all labs ordered  are listed, but only abnormal results are displayed) Labs Reviewed  BASIC METABOLIC PANEL - Abnormal; Notable for the following components:      Result Value   Glucose, Bld 165 (*)    All other components within normal limits  C DIFFICILE QUICK SCREEN W PCR REFLEX  STOOL CULTURE  OVA + PARASITE EXAM  CBC WITH DIFFERENTIAL/PLATELET    EKG None  Radiology Ct Abdomen Pelvis W Contrast  Result Date: 02/27/2018 CLINICAL DATA:  Abdominal distention. EXAM: CT ABDOMEN AND PELVIS WITH CONTRAST TECHNIQUE: Multidetector CT imaging of the abdomen and pelvis was performed using the standard protocol following bolus administration of intravenous contrast. CONTRAST:  133mL ISOVUE-300 IOPAMIDOL (ISOVUE-300) INJECTION 61% COMPARISON:  07/02/2016 FINDINGS: Lower chest: Mild reticulation and right middle lobe scarring. No acute finding Hepatobiliary: No focal liver abnormality.Layering gallstones. No acute inflammation. Pancreas: Unremarkable. Spleen: Unremarkable. Adrenals/Urinary Tract: Negative adrenals. No hydronephrosis or stone. Unremarkable bladder. Stomach/Bowel: No obstruction. Appendectomy. No inflammatory changes. Vascular/Lymphatic: No acute vascular  abnormality. Atherosclerotic calcification with at least moderate narrowing at the celiac origin. There is good patency of the proximal SMA. No mass or adenopathy. Reproductive:Hysterectomy with a degree of pelvic floor laxity. If remaining, negative adnexae. Other: No ascites or pneumoperitoneum. Ventral hernia repair using mesh Musculoskeletal: No acute abnormalities. Scoliosis and lumbar dextrocurvature. IMPRESSION: 1. No acute finding. 2. Cholelithiasis. Electronically Signed   By: Monte Fantasia M.D.   On: 02/27/2018 09:48    Procedures Procedures (including critical care time)  Medications Ordered in ED Medications  sodium chloride 0.9 % bolus 1,000 mL (0 mLs Intravenous Stopped 02/27/18 1029)  iopamidol (ISOVUE-300) 61 % injection 100 mL (100 mLs Intravenous Contrast Given 02/27/18 0904)     Initial Impression / Assessment and Plan / ED Course  I have reviewed the triage vital signs and the nursing notes.  Pertinent labs & imaging results that were available during my care of the patient were reviewed by me and considered in my medical decision making (see chart for details).     Labs unremarkable.  CT scan shows some gallstones but no cholecystitis.  Stool specimens pending.  Patient is discharged home will follow-up with her PCP who can then follow-up in the stool specimens  Final Clinical Impressions(s) / ED Diagnoses   Final diagnoses:  Diarrhea in adult patient    ED Discharge Orders    None       Milton Ferguson, MD 02/27/18 1226

## 2018-02-27 NOTE — ED Notes (Signed)
Stool specimens that pt collected at home sent to lab.    Pt started eating a sandwich which pt did not get ok'd by EDP,  Asked EDP about eating and was ok'd.  Pt and pt family member informed.

## 2018-02-27 NOTE — ED Triage Notes (Addendum)
Pt reports diarrhea x3 weeks ago for 7 days. Pt reports next week was treated with vertigo. Pt reports diarrhea started this week and reports "white stools since yesterday." pt reports was seen at Dr. Malachy Moan office and was contacted last night and informed "blood work was fine". Pt reports was given a stool collection container and sample was collected this am prior to arrival. Pt has specimen at bedside. Pt reports took lomotil prior to arrival as well. nad noted. Pt reports abd discomfort but denies nausea,emesis, fever.  Pt reports was treated with three day dose of cipro approximately 6 weeks ago.

## 2018-02-28 MED ORDER — METRONIDAZOLE 500 MG PO TABS: ORAL_TABLET | ORAL | 0 refills | Status: DC

## 2018-03-02 ENCOUNTER — Inpatient Hospital Stay (HOSPITAL_COMMUNITY)
Admission: EM | Admit: 2018-03-02 | Discharge: 2018-03-05 | DRG: 372 | Disposition: A | Discharge status: Home Health | Payer: Medicare Other | Attending: Internal Medicine | Admitting: Internal Medicine

## 2018-03-02 ENCOUNTER — Other Ambulatory Visit: Payer: Self-pay

## 2018-03-02 ENCOUNTER — Encounter (HOSPITAL_COMMUNITY): Payer: Self-pay

## 2018-03-02 DIAGNOSIS — E876 Hypokalemia: Secondary | ICD-10-CM | POA: Diagnosis present

## 2018-03-02 DIAGNOSIS — Z833 Family history of diabetes mellitus: Secondary | ICD-10-CM

## 2018-03-02 DIAGNOSIS — I700 Atherosclerosis of aorta: Secondary | ICD-10-CM

## 2018-03-02 DIAGNOSIS — Z888 Allergy status to other drugs, medicaments and biological substances status: Secondary | ICD-10-CM

## 2018-03-02 DIAGNOSIS — N390 Urinary tract infection, site not specified: Secondary | ICD-10-CM | POA: Diagnosis not present

## 2018-03-02 DIAGNOSIS — R5383 Other fatigue: Secondary | ICD-10-CM

## 2018-03-02 DIAGNOSIS — Z9849 Cataract extraction status, unspecified eye: Secondary | ICD-10-CM

## 2018-03-02 DIAGNOSIS — E871 Hypo-osmolality and hyponatremia: Secondary | ICD-10-CM | POA: Diagnosis not present

## 2018-03-02 DIAGNOSIS — Z87440 Personal history of urinary (tract) infections: Secondary | ICD-10-CM

## 2018-03-02 DIAGNOSIS — Z79899 Other long term (current) drug therapy: Secondary | ICD-10-CM

## 2018-03-02 DIAGNOSIS — A0472 Enterocolitis due to Clostridium difficile, not specified as recurrent: Secondary | ICD-10-CM | POA: Diagnosis not present

## 2018-03-02 DIAGNOSIS — E860 Dehydration: Secondary | ICD-10-CM | POA: Diagnosis present

## 2018-03-02 DIAGNOSIS — Z9049 Acquired absence of other specified parts of digestive tract: Secondary | ICD-10-CM

## 2018-03-02 DIAGNOSIS — E1151 Type 2 diabetes mellitus with diabetic peripheral angiopathy without gangrene: Secondary | ICD-10-CM

## 2018-03-02 DIAGNOSIS — E878 Other disorders of electrolyte and fluid balance, not elsewhere classified: Secondary | ICD-10-CM | POA: Diagnosis present

## 2018-03-02 DIAGNOSIS — Z8249 Family history of ischemic heart disease and other diseases of the circulatory system: Secondary | ICD-10-CM

## 2018-03-02 DIAGNOSIS — Z90710 Acquired absence of both cervix and uterus: Secondary | ICD-10-CM

## 2018-03-02 DIAGNOSIS — E785 Hyperlipidemia, unspecified: Secondary | ICD-10-CM | POA: Diagnosis present

## 2018-03-02 DIAGNOSIS — I10 Essential (primary) hypertension: Secondary | ICD-10-CM | POA: Diagnosis present

## 2018-03-02 DIAGNOSIS — E119 Type 2 diabetes mellitus without complications: Secondary | ICD-10-CM | POA: Diagnosis present

## 2018-03-02 DIAGNOSIS — Z8041 Family history of malignant neoplasm of ovary: Secondary | ICD-10-CM

## 2018-03-02 DIAGNOSIS — B9620 Unspecified Escherichia coli [E. coli] as the cause of diseases classified elsewhere: Secondary | ICD-10-CM | POA: Diagnosis present

## 2018-03-02 LAB — CBC WITH DIFFERENTIAL/PLATELET
Basophils Absolute: 0 10*3/uL (ref 0.0–0.1)
Basophils Relative: 0 %
EOS ABS: 0.1 10*3/uL (ref 0.0–0.7)
EOS PCT: 1 %
HCT: 37.7 % (ref 36.0–46.0)
Hemoglobin: 13.1 g/dL (ref 12.0–15.0)
LYMPHS ABS: 1.7 10*3/uL (ref 0.7–4.0)
LYMPHS PCT: 18 %
MCH: 28.3 pg (ref 26.0–34.0)
MCHC: 34.7 g/dL (ref 30.0–36.0)
MCV: 81.4 fL (ref 78.0–100.0)
MONO ABS: 0.7 10*3/uL (ref 0.1–1.0)
MONOS PCT: 8 %
Neutro Abs: 7 10*3/uL (ref 1.7–7.7)
Neutrophils Relative %: 73 %
PLATELETS: 325 10*3/uL (ref 150–400)
RBC: 4.63 MIL/uL (ref 3.87–5.11)
RDW: 13.5 % (ref 11.5–15.5)
WBC: 9.5 10*3/uL (ref 4.0–10.5)

## 2018-03-02 LAB — COMPREHENSIVE METABOLIC PANEL
ALT: 13 U/L (ref 0–44)
ANION GAP: 15 (ref 5–15)
AST: 18 U/L (ref 15–41)
Albumin: 4.3 g/dL (ref 3.5–5.0)
Alkaline Phosphatase: 77 U/L (ref 38–126)
BUN: 18 mg/dL (ref 8–23)
CALCIUM: 9.5 mg/dL (ref 8.9–10.3)
CHLORIDE: 88 mmol/L — AB (ref 98–111)
CO2: 26 mmol/L (ref 22–32)
CREATININE: 0.79 mg/dL (ref 0.44–1.00)
Glucose, Bld: 152 mg/dL — ABNORMAL HIGH (ref 70–99)
Potassium: 3.9 mmol/L (ref 3.5–5.1)
SODIUM: 129 mmol/L — AB (ref 135–145)
Total Bilirubin: 0.9 mg/dL (ref 0.3–1.2)
Total Protein: 7.5 g/dL (ref 6.5–8.1)

## 2018-03-02 LAB — LIPASE, BLOOD: LIPASE: 33 U/L (ref 11–51)

## 2018-03-02 LAB — I-STAT CG4 LACTIC ACID, ED: LACTIC ACID, VENOUS: 1.89 mmol/L (ref 0.5–1.9)

## 2018-03-02 MED ORDER — VANCOMYCIN 50 MG/ML ORAL SOLUTION
125.0000 mg | Freq: Four times a day (QID) | ORAL | Status: DC
  Administered 2018-03-03 – 2018-03-05 (×11): 125 mg via ORAL
  Filled 2018-03-02 (×14): qty 2.5

## 2018-03-02 MED ORDER — SODIUM CHLORIDE 0.9 % IV BOLUS
1000.0000 mL | Freq: Once | INTRAVENOUS | Status: AC
  Administered 2018-03-02: 1000 mL via INTRAVENOUS

## 2018-03-02 MED ORDER — ONDANSETRON HCL 4 MG/2ML IJ SOLN
4.0000 mg | Freq: Once | INTRAMUSCULAR | Status: AC
  Administered 2018-03-02: 4 mg via INTRAVENOUS
  Filled 2018-03-02: qty 2

## 2018-03-02 NOTE — ED Triage Notes (Signed)
Pt presents to Ed from home for N/V/D and ABD pain. Pt reports that she was diagnosed with c-diff on Saturday, and PCP prescribed Flagyl. Pt reports that she feels worse today.

## 2018-03-02 NOTE — ED Provider Notes (Signed)
[facility] DEPT Provider Note   CSN: [id number] Arrival date & time: 03/02/18  2038     History   Chief Complaint Chief Complaint  Patient presents with  . Abdominal Pain  . Emesis  . Diarrhea    HPI [name] is a 84 y.o. female.  The history is provided by the patient, medical records and a relative. No language interpreter was used.  Diarrhea   This is a new problem. The current episode started more than 1 week ago. The problem occurs continuously. The problem has not changed since onset.The stool consistency is described as watery. Associated symptoms include abdominal pain, vomiting and chills. Pertinent negatives include no headaches and no cough. She has tried nothing for the symptoms. The treatment provided no relief.    Past Medical History:  Diagnosis Date  . Diabetes mellitus without complication ([place])   . Hyperlipidemia    a. intolerant to statins.   . Hypertension     Patient Active Problem List   Diagnosis Date Noted  . Hyperlipidemia associated with type 2 diabetes mellitus ([place]) 10/02/2016  . Pedal edema 10/02/2016  . Insomnia 10/02/2016  . SBO (small bowel obstruction) ([place]) 07/02/2016  . Dyspnea on exertion 02/24/2016  . Other fatigue   . E-coli UTI 06/18/2015  . Partial small bowel obstruction ([place]) 06/15/2015  . Ileitis, regional, with intestinal obstruction 06/15/2015  . Leukocytosis 12/16/2014  . Nausea and vomiting 12/15/2014  . HLD (hyperlipidemia) 12/15/2014  . Essential hypertension 12/15/2014  . Controlled type 2 diabetes mellitus without complication, without long-term current use of insulin ([place]) 02/15/2011    Past Surgical History:  Procedure Laterality Date  . ABDOMINAL HYSTERECTOMY    . APPENDECTOMY    . BREAST REDUCTION SURGERY    . CARDIAC CATHETERIZATION    . COLON SURGERY Left    Hemicolectomy due to diverticulitis  . EYE SURGERY  03/30/2009   cataract  . KNEE SURGERY    .  LAPAROSCOPIC INCISIONAL / UMBILICAL / VENTRAL HERNIA REPAIR  02/23/2007   . REDUCTION MAMMAPLASTY Bilateral 2001  . REFRACTIVE SURGERY       OB History   None      Home Medications    Prior to Admission medications   Medication Sig Start Date End Date Taking? Authorizing Provider  acetaminophen (TYLENOL) 500 MG tablet Take 500-1,000 mg by mouth every 6 (six) hours as needed for mild pain or moderate pain.    [provider]  amLODipine (NORVASC) 10 MG tablet Take one tablet daily Patient taking differently: Take 10 mg by mouth daily.  01/22/18   Mikey Kirschner, MD  beta carotene w/minerals (OCUVITE) tablet Take 1 tablet by mouth daily.    [provider]  Cyanocobalamin 2500 MCG TABS Take 2,500 mcg by mouth daily.     [provider]  escitalopram (LEXAPRO) 10 MG tablet Take one tabletpo Q am 02/26/18   Mikey Kirschner, MD  hydrALAZINE (APRESOLINE) 10 MG tablet Take 1 tablet (10 mg total) by mouth 2 (two) times daily. One bid Patient taking differently: Take 10 mg by mouth 3 (three) times daily.  10/20/17   Kathyrn Drown, MD  hydrOXYzine (ATARAX/VISTARIL) 25 MG tablet Take 1 tablet (25 mg total) by mouth every 6 (six) hours as needed for anxiety. Patient not taking: Reported on 02/26/2018 01/27/18   Milton Ferguson, MD  indapamide (LOZOL) 2.5 MG tablet 1 qam Patient taking differently: Take 2.5 mg by mouth every morning.  01/16/18   Kathyrn Drown, MD  losartan (COZAAR) 100 MG tablet TAKE 1 TABLET BY MOUTH ONCE DAILY 11/27/17   Kathyrn Drown, MD  metFORMIN (GLUCOPHAGE) 1000 MG tablet TAKE 1 TABLET BY MOUTH TWICE DAILY 09/22/17   Kathyrn Drown, MD  metroNIDAZOLE (FLAGYL) 500 MG tablet One tid for ten d 02/28/18   Mikey Kirschner, MD  polyvinyl alcohol (LIQUIFILM TEARS) 1.4 % ophthalmic solution Place 1 drop into both eyes 3 (three) times daily.     [provider]  tobramycin (TOBREX) 0.3 % ophthalmic solution Put 1 drop in the left eye every 4  hours while awake. Patient not taking: Reported on 02/26/2018 01/27/18   Milton Ferguson, MD  tobramycin-dexamethasone [facility]) ophthalmic solution Place 1 drop into both eyes every 4 (four) hours while awake.  01/30/18   [provider]    Family History Family History  Problem Relation Age of Onset  . Hypertension Father        kidney  . Stroke Father   . Cancer Father   . Cancer Mother        ovaian  . Diabetes Brother   . Hypertension Sister     Social History Social History   Tobacco Use  . Smoking status: Never Smoker  . Smokeless tobacco: Never Used  Substance Use Topics  . Alcohol use: No  . Drug use: No     Allergies   Phenergan [promethazine hcl]; Statins; Trazodone and nefazodone; and Prednisone   Review of Systems Review of Systems  Constitutional: Positive for chills, fatigue and fever. Negative for diaphoresis.  HENT: Negative for congestion.   Respiratory: Negative for cough, chest tightness, shortness of breath and wheezing.   Cardiovascular: Negative for chest pain and palpitations.  Gastrointestinal: Positive for abdominal pain, diarrhea, nausea and vomiting. Negative for abdominal distention.  Genitourinary: Negative for dysuria and flank pain.  Musculoskeletal: Negative for back pain, neck pain and neck stiffness.  Neurological: Positive for light-headedness. Negative for headaches.  Psychiatric/Behavioral: Negative for agitation.  All other systems reviewed and are negative.    Physical Exam Updated Vital Signs There were no vitals taken for this visit.  Physical Exam  Constitutional: She appears well-developed and well-nourished.  Non-toxic appearance. She does not appear ill. No distress.  HENT:  Head: Normocephalic and atraumatic.  Mouth/Throat: Oropharynx is clear and moist. No oropharyngeal exudate.  Eyes: Pupils are equal, round, and reactive to light. Conjunctivae are normal.  Neck: Normal range of motion. Neck supple.    Cardiovascular: Normal rate and regular rhythm.  No murmur heard. Pulmonary/Chest: Effort normal and breath sounds normal. No respiratory distress. She has no wheezes. She has no rales. She exhibits no tenderness.  Abdominal: Soft. She exhibits no distension. There is tenderness (mild).  Musculoskeletal: She exhibits no edema or tenderness.  Neurological: She is alert. No sensory deficit. She exhibits normal muscle tone.  Skin: Skin is warm and dry. Capillary refill takes less than 2 seconds. No rash noted. She is not diaphoretic. No erythema.  Psychiatric: She has a normal mood and affect.  Nursing note and vitals reviewed.    ED Treatments / Results  Labs (all labs ordered are listed, but only abnormal results are displayed) Labs Reviewed  COMPREHENSIVE METABOLIC PANEL - Abnormal; Notable for the following components:      Result Value   Sodium 129 (*)    Chloride 88 (*)    Glucose, Bld 152 (*)    All other components  within normal limits  URINALYSIS, ROUTINE W REFLEX MICROSCOPIC - Abnormal; Notable for the following components:   APPearance CLOUDY (*)    pH 9.0 (*)    Hgb urine dipstick SMALL (*)    Leukocytes, UA LARGE (*)    WBC, UA >50 (*)    Bacteria, UA MANY (*)    All other components within normal limits  URINE CULTURE  CBC WITH DIFFERENTIAL/PLATELET  LIPASE, BLOOD  I-STAT CG4 LACTIC ACID, ED  I-STAT CG4 LACTIC ACID, ED    EKG None  Radiology No results found.  Procedures Procedures (including critical care time)  Medications Ordered in ED Medications  vancomycin (VANCOCIN) 50 mg/mL oral solution 125 mg (has no administration in time range)  sodium chloride 0.9 % bolus 1,000 mL (1,000 mLs Intravenous New Bag/Given 03/02/18 2245)  ondansetron (ZOFRAN) injection 4 mg (4 mg Intravenous Given 03/02/18 2244)     Initial Impression / Assessment and Plan / ED Course  I have reviewed the triage vital signs and the nursing notes.  Pertinent labs & imaging  results that were available during my care of the patient were reviewed by me and considered in my medical decision making (see chart for details).     [name] is a 84 y.o. female with a past medical history significant for diabetes, hyperlipidemia, hypertension, prior small bowel obstruction, and recent diagnosis of C. difficile who presents with fatigue, nausea, vomiting, diarrhea, malaise.  Patient reports that she has been having diarrhea for the last week and was called and told she had C. difficile.  PCP prescribed her Flagyl and she reports she is continued to feel worse.  She reports she is feeling very fatigued and tired and lives alone.  She says she has had difficulty getting up and getting around.  She says she is not eating or drinking and is not able to take all of her medications due to the nausea, vomiting, and malaise.  She says that she is a diabetic and has not been able to take her medicines due to this.  She says that she has been having subjective fevers and chills.  She has had no cough, congestion or chest pain.  She denies any other complaints but feels her mouth is very dry and she is dehydrated.  Reports moderate abdominal cramping and aching.  She says she had a CT scan that was reassuring during her last visit.  Next  On exam, abdomen is mildly tender.  Lungs are clear.  Patient appears dehydrated with dry mucous membranes.  Patient had screen laboratory testing to look for significant lab abnormalities.    Patient was found to be hyponatremic and hypochloremic with a sodium of 129.  I suspect this is due to her fluid losses.  Patient had no leukocytosis or anemia.  Urinalysis showed leukocytes and bacteria however she was having no urinary symptoms at this time.  No nitrites seen.  Lactic acid initially not elevated and lipase not elevated.  Based on her minimally tender abdomen, I do not suspect she has toxic megacolon at this time.  Do not feel she needs repeat CT  scan.  Clinically I am concerned about the patient's ability to maintain hydration which she is already approved she cannot do with her electrolyte derangements, her ability to take her home medications with the nausea and malaise, and her safety with her age of 68 and hyponatremia with C. Difficile.  Due to these concerns, patient will be admitted for C.  difficile treatment and IV rehydration.   Final Clinical Impressions(s) / ED Diagnoses   Final diagnoses:  Hyponatremia  Dehydration  C. difficile diarrhea  Other fatigue    ED Discharge Orders    None     Clinical Impression: 1. Hyponatremia   2. Dehydration   3. C. difficile diarrhea   4. Other fatigue     Disposition: Admit  This note was prepared with assistance of Dragon voice recognition software. Occasional wrong-word or sound-a-like substitutions may have occurred due to the inherent limitations of voice recognition software.     Tegeler, Gwenyth Allegra, MD 03/03/18 [phone]

## 2018-03-03 DIAGNOSIS — Z79899 Other long term (current) drug therapy: Secondary | ICD-10-CM | POA: Diagnosis not present

## 2018-03-03 DIAGNOSIS — E871 Hypo-osmolality and hyponatremia: Secondary | ICD-10-CM | POA: Diagnosis present

## 2018-03-03 DIAGNOSIS — B9620 Unspecified Escherichia coli [E. coli] as the cause of diseases classified elsewhere: Secondary | ICD-10-CM | POA: Diagnosis present

## 2018-03-03 DIAGNOSIS — Z87440 Personal history of urinary (tract) infections: Secondary | ICD-10-CM | POA: Diagnosis not present

## 2018-03-03 DIAGNOSIS — E785 Hyperlipidemia, unspecified: Secondary | ICD-10-CM | POA: Diagnosis present

## 2018-03-03 DIAGNOSIS — Z888 Allergy status to other drugs, medicaments and biological substances status: Secondary | ICD-10-CM | POA: Diagnosis not present

## 2018-03-03 DIAGNOSIS — A0472 Enterocolitis due to Clostridium difficile, not specified as recurrent: Secondary | ICD-10-CM | POA: Diagnosis present

## 2018-03-03 DIAGNOSIS — Z9849 Cataract extraction status, unspecified eye: Secondary | ICD-10-CM | POA: Diagnosis not present

## 2018-03-03 DIAGNOSIS — E119 Type 2 diabetes mellitus without complications: Secondary | ICD-10-CM | POA: Diagnosis not present

## 2018-03-03 DIAGNOSIS — Z833 Family history of diabetes mellitus: Secondary | ICD-10-CM | POA: Diagnosis not present

## 2018-03-03 DIAGNOSIS — Z8041 Family history of malignant neoplasm of ovary: Secondary | ICD-10-CM | POA: Diagnosis not present

## 2018-03-03 DIAGNOSIS — E878 Other disorders of electrolyte and fluid balance, not elsewhere classified: Secondary | ICD-10-CM | POA: Diagnosis present

## 2018-03-03 DIAGNOSIS — Z9049 Acquired absence of other specified parts of digestive tract: Secondary | ICD-10-CM | POA: Diagnosis not present

## 2018-03-03 DIAGNOSIS — R5383 Other fatigue: Secondary | ICD-10-CM | POA: Diagnosis not present

## 2018-03-03 DIAGNOSIS — I10 Essential (primary) hypertension: Secondary | ICD-10-CM | POA: Diagnosis present

## 2018-03-03 DIAGNOSIS — N390 Urinary tract infection, site not specified: Secondary | ICD-10-CM | POA: Diagnosis present

## 2018-03-03 DIAGNOSIS — E860 Dehydration: Secondary | ICD-10-CM | POA: Diagnosis present

## 2018-03-03 DIAGNOSIS — Z90710 Acquired absence of both cervix and uterus: Secondary | ICD-10-CM | POA: Diagnosis not present

## 2018-03-03 DIAGNOSIS — Z8249 Family history of ischemic heart disease and other diseases of the circulatory system: Secondary | ICD-10-CM | POA: Diagnosis not present

## 2018-03-03 DIAGNOSIS — E876 Hypokalemia: Secondary | ICD-10-CM | POA: Diagnosis present

## 2018-03-03 LAB — URINALYSIS, ROUTINE W REFLEX MICROSCOPIC
Bilirubin Urine: NEGATIVE
Glucose, UA: NEGATIVE mg/dL
Ketones, ur: NEGATIVE mg/dL
Nitrite: NEGATIVE
PH: 9 — AB (ref 5.0–8.0)
Protein, ur: NEGATIVE mg/dL
SPECIFIC GRAVITY, URINE: 1.005 (ref 1.005–1.030)
WBC, UA: >50 WBC/hpf — ABNORMAL HIGH (ref 0–5)

## 2018-03-03 LAB — STOOL CULTURE REFLEX - CMPCXR

## 2018-03-03 LAB — BASIC METABOLIC PANEL
Anion gap: 11 (ref 5–15)
BUN: 14 mg/dL (ref 8–23)
CALCIUM: 8.9 mg/dL (ref 8.9–10.3)
CO2: 26 mmol/L (ref 22–32)
Chloride: 95 mmol/L — ABNORMAL LOW (ref 98–111)
Creatinine, Ser: 0.81 mg/dL (ref 0.44–1.00)
Glucose, Bld: 164 mg/dL — ABNORMAL HIGH (ref 70–99)
Potassium: 3.4 mmol/L — ABNORMAL LOW (ref 3.5–5.1)
Sodium: 132 mmol/L — ABNORMAL LOW (ref 135–145)

## 2018-03-03 LAB — STOOL CULTURE REFLEX - RSASHR

## 2018-03-03 LAB — GLUCOSE, CAPILLARY
GLUCOSE-CAPILLARY: 106 mg/dL — AB (ref 70–99)
Glucose-Capillary: 134 mg/dL — ABNORMAL HIGH (ref 70–99)
Glucose-Capillary: 114 mg/dL — ABNORMAL HIGH (ref 70–99)
Glucose-Capillary: 97 mg/dL (ref 70–99)

## 2018-03-03 LAB — STOOL CULTURE: E COLI SHIGA TOXIN ASSAY: NEGATIVE

## 2018-03-03 MED ORDER — FENTANYL CITRATE (PF) 100 MCG/2ML IJ SOLN: 12.5000 ug | INTRAMUSCULAR | Status: DC | PRN

## 2018-03-03 MED ORDER — SODIUM CHLORIDE 0.9 % IV SOLN
1.0000 g | Freq: Every day | INTRAVENOUS | Status: DC
  Administered 2018-03-03 – 2018-03-04 (×3): 1 g via INTRAVENOUS
  Filled 2018-03-03: qty 1
  Filled 2018-03-03 (×2): qty 10
  Filled 2018-03-03: qty 1

## 2018-03-03 MED ORDER — HYDROXYZINE HCL 25 MG PO TABS
25.0000 mg | ORAL_TABLET | Freq: Four times a day (QID) | ORAL | Status: DC | PRN
  Administered 2018-03-03 – 2018-03-04 (×3): 25 mg via ORAL
  Filled 2018-03-03 (×4): qty 1

## 2018-03-03 MED ORDER — AMLODIPINE BESYLATE 5 MG PO TABS
10.0000 mg | ORAL_TABLET | Freq: Every day | ORAL | Status: DC
  Administered 2018-03-03 – 2018-03-05 (×3): 10 mg via ORAL
  Filled 2018-03-03 (×3): qty 2

## 2018-03-03 MED ORDER — PROSIGHT PO TABS
1.0000 | ORAL_TABLET | Freq: Every day | ORAL | Status: DC
  Administered 2018-03-03 – 2018-03-05 (×3): 1 via ORAL
  Filled 2018-03-03 (×3): qty 1

## 2018-03-03 MED ORDER — ONDANSETRON HCL 4 MG PO TABS: 4.0000 mg | ORAL_TABLET | Freq: Four times a day (QID) | ORAL | Status: DC | PRN

## 2018-03-03 MED ORDER — ENOXAPARIN SODIUM 40 MG/0.4ML ~~LOC~~ SOLN
40.0000 mg | Freq: Every day | SUBCUTANEOUS | Status: DC
  Administered 2018-03-03 – 2018-03-05 (×3): 40 mg via SUBCUTANEOUS
  Filled 2018-03-03 (×3): qty 0.4

## 2018-03-03 MED ORDER — POLYVINYL ALCOHOL 1.4 % OP SOLN
1.0000 [drp] | Freq: Three times a day (TID) | OPHTHALMIC | Status: DC
  Administered 2018-03-03 – 2018-03-05 (×8): 1 [drp] via OPHTHALMIC
  Filled 2018-03-03: qty 15

## 2018-03-03 MED ORDER — HYDRALAZINE HCL 10 MG PO TABS
10.0000 mg | ORAL_TABLET | Freq: Three times a day (TID) | ORAL | Status: DC
  Administered 2018-03-03 (×3): 10 mg via ORAL
  Filled 2018-03-03 (×5): qty 1

## 2018-03-03 MED ORDER — LOSARTAN POTASSIUM 50 MG PO TABS
100.0000 mg | ORAL_TABLET | Freq: Every day | ORAL | Status: DC
  Administered 2018-03-03 – 2018-03-05 (×3): 100 mg via ORAL
  Filled 2018-03-03 (×3): qty 2

## 2018-03-03 MED ORDER — POTASSIUM CHLORIDE CRYS ER 20 MEQ PO TBCR
40.0000 meq | EXTENDED_RELEASE_TABLET | Freq: Once | ORAL | Status: AC
  Administered 2018-03-03: 40 meq via ORAL
  Filled 2018-03-03: qty 2

## 2018-03-03 MED ORDER — ONDANSETRON HCL 4 MG/2ML IJ SOLN: 4.0000 mg | Freq: Four times a day (QID) | INTRAMUSCULAR | Status: DC | PRN

## 2018-03-03 MED ORDER — INSULIN ASPART 100 UNIT/ML ~~LOC~~ SOLN
0.0000 [IU] | Freq: Three times a day (TID) | SUBCUTANEOUS | Status: DC
  Administered 2018-03-03 – 2018-03-04 (×2): 1 [IU] via SUBCUTANEOUS
  Administered 2018-03-05: 2 [IU] via SUBCUTANEOUS

## 2018-03-03 MED ORDER — SODIUM CHLORIDE 0.9 % IV SOLN
INTRAVENOUS | Status: DC
  Administered 2018-03-03 – 2018-03-04 (×3): via INTRAVENOUS

## 2018-03-03 MED ORDER — VITAMIN B-12 1000 MCG PO TABS
2500.0000 ug | ORAL_TABLET | Freq: Every day | ORAL | Status: DC
  Administered 2018-03-03 – 2018-03-05 (×3): 2500 ug via ORAL
  Filled 2018-03-03 (×3): qty 3

## 2018-03-03 MED ORDER — ACETAMINOPHEN 650 MG RE SUPP: 650.0000 mg | Freq: Four times a day (QID) | RECTAL | Status: DC | PRN

## 2018-03-03 MED ORDER — ACETAMINOPHEN 325 MG PO TABS: 650.0000 mg | ORAL_TABLET | Freq: Four times a day (QID) | ORAL | Status: DC | PRN

## 2018-03-03 NOTE — Plan of Care (Signed)
  Problem: Education: Goal: Knowledge of General Education information will improve Description Including pain rating scale, medication(s)/side effects and non-pharmacologic comfort measures Outcome: Progressing   Problem: Health Behavior/Discharge Planning: Goal: Ability to manage health-related needs will improve Outcome: Progressing   

## 2018-03-03 NOTE — H&P (Signed)
History and Physical    HANG AMMON BLT:[id number] DOB: 06/16/1932 DOA: 03/02/2018  PCP: Kathyrn Drown, MD  Patient coming from: Home  I have personally briefly reviewed patient's old medical records in [place]  Chief Complaint: Abd pain, vomiting, diarrhea  HPI: [name] is a 84 y.o. female with medical history significant of DM2, HTN.  Patient underwent treatment with Cipro for UTI some ~6 weeks ago.  Then about 4 weeks ago she developed Diarrhea.  Diarrhea abd pain, has persisted and progressed to N/V as well.  Stool cultures drawn which showed C.Diff.  Went to PCP who put her on Flagyl.  Despite taking Flagyl symptoms have actually worsened.  CT scan during last visit was reassuring.   ED Course: 1L NS bolus ordered sodium 129.  UA suggestive of UTI.  Started on PO vanc for C.Diff.   Review of Systems: As per HPI otherwise 10 point review of systems negative.   Past Medical History:  Diagnosis Date  . Diabetes mellitus without complication ([place])   . Hyperlipidemia    a. intolerant to statins.   . Hypertension     Past Surgical History:  Procedure Laterality Date  . ABDOMINAL HYSTERECTOMY    . APPENDECTOMY    . BREAST REDUCTION SURGERY    . CARDIAC CATHETERIZATION    . COLON SURGERY Left    Hemicolectomy due to diverticulitis  . EYE SURGERY  03/30/2009   cataract  . KNEE SURGERY    . LAPAROSCOPIC INCISIONAL / UMBILICAL / VENTRAL HERNIA REPAIR  02/23/2007   . REDUCTION MAMMAPLASTY Bilateral 2001  . REFRACTIVE SURGERY       reports that she has never smoked. She has never used smokeless tobacco. She reports that she does not drink alcohol or use drugs.  Allergies  Allergen Reactions  . Phenergan [Promethazine Hcl] Other (See Comments)    hallucinations  . Statins Other (See Comments)    Severe myalgias  . Trazodone And Nefazodone Cough  . Prednisone Rash    Family History  Problem Relation Age of Onset  . Hypertension Father    kidney  . Stroke Father   . Cancer Father   . Cancer Mother        ovaian  . Diabetes Brother   . Hypertension Sister      Prior to Admission medications   Medication Sig Start Date End Date Taking? Authorizing Provider  acetaminophen (TYLENOL) 500 MG tablet Take 500-1,000 mg by mouth every 6 (six) hours as needed for mild pain or moderate pain.   Yes [provider]  amLODipine (NORVASC) 10 MG tablet Take one tablet daily Patient taking differently: Take 10 mg by mouth daily.  01/22/18  Yes Mikey Kirschner, MD  beta carotene w/minerals (OCUVITE) tablet Take 1 tablet by mouth daily.   Yes [provider]  Cyanocobalamin 2500 MCG TABS Take 2,500 mcg by mouth daily.    Yes [provider]  hydrALAZINE (APRESOLINE) 10 MG tablet Take 1 tablet (10 mg total) by mouth 2 (two) times daily. One bid Patient taking differently: Take 10 mg by mouth 3 (three) times daily.  10/20/17  Yes Luking, Elayne Snare, MD  indapamide (LOZOL) 2.5 MG tablet 1 qam Patient taking differently: Take 2.5 mg by mouth every morning.  01/16/18  Yes Kathyrn Drown, MD  losartan (COZAAR) 100 MG tablet TAKE 1 TABLET BY MOUTH ONCE DAILY 11/27/17  Yes Luking, Elayne Snare, MD  metFORMIN (GLUCOPHAGE) 1000 MG  tablet TAKE 1 TABLET BY MOUTH TWICE DAILY 09/22/17  Yes Luking, Elayne Snare, MD  polyvinyl alcohol (LIQUIFILM TEARS) 1.4 % ophthalmic solution Place 1 drop into both eyes 3 (three) times daily.    Yes [provider]  escitalopram (LEXAPRO) 10 MG tablet Take one tabletpo Q am 02/26/18   Mikey Kirschner, MD  hydrOXYzine (ATARAX/VISTARIL) 25 MG tablet Take 1 tablet (25 mg total) by mouth every 6 (six) hours as needed for anxiety. Patient not taking: Reported on 02/26/2018 01/27/18   Milton Ferguson, MD    Physical Exam: Vitals:   03/02/18 2145 03/03/18 0106  BP: (!) 155/62 (!) 150/60  Pulse: 70 71  Resp: 18 17  Temp: 98.5 F (36.9 C) 98.5 F (36.9 C)  TempSrc: Oral Oral  SpO2: 98% 100%     Constitutional: NAD, calm, comfortable Eyes: PERRL, lids and conjunctivae normal ENMT: Mucous membranes are moist. Posterior pharynx clear of any exudate or lesions.Normal dentition.  Neck: normal, supple, no masses, no thyromegaly Respiratory: clear to auscultation bilaterally, no wheezing, no crackles. Normal respiratory effort. No accessory muscle use.  Cardiovascular: Regular rate and rhythm, no murmurs / rubs / gallops. No extremity edema. 2+ pedal pulses. No carotid bruits.  Abdomen: Mild diffuse tenderness Musculoskeletal: no clubbing / cyanosis. No joint deformity upper and lower extremities. Good ROM, no contractures. Normal muscle tone.  Skin: no rashes, lesions, ulcers. No induration Neurologic: CN 2-12 grossly intact. Sensation intact, DTR normal. Strength 5/5 in all 4.  Psychiatric: Normal judgment and insight. Alert and oriented x 3. Normal mood.    Labs on Admission: I have personally reviewed following labs and imaging studies  CBC: Recent Labs  Lab 02/26/18 1422 02/27/18 0819 03/02/18 2234  WBC 8.8 8.0 9.5  NEUTROABS 5.4 6.0 7.0  HGB 12.4 13.0 13.1  HCT 38.8 39.3 37.7  MCV 87.2 86.8 81.4  PLT 297 279 191   Basic Metabolic Panel: Recent Labs  Lab 02/26/18 1422 02/27/18 0819 03/02/18 2234  NA 137 139 129*  K 3.8 4.3 3.9  CL 98 98 88*  CO2 28 29 26   GLUCOSE 100* 165* 152*  BUN 16 17 18   CREATININE 0.82 0.81 0.79  CALCIUM 9.6 10.1 9.5   GFR: Estimated Creatinine Clearance: 52.8 mL/min (by C-G formula based on SCr of 0.79 mg/dL). Liver Function Tests: Recent Labs  Lab 02/26/18 1422 03/02/18 2234  AST 16 18  ALT 12 13  ALKPHOS 84 77  BILITOT 0.9 0.9  PROT 7.3 7.5  ALBUMIN 3.9 4.3   Recent Labs  Lab 03/02/18 2234  LIPASE 33   No results for input(s): AMMONIA in the last 168 hours. Coagulation Profile: No results for input(s): INR, PROTIME in the last 168 hours. Cardiac Enzymes: No results for input(s): CKTOTAL, CKMB, CKMBINDEX,  TROPONINI in the last 168 hours. BNP (last 3 results) No results for input(s): PROBNP in the last 8760 hours. HbA1C: No results for input(s): HGBA1C in the last 72 hours. CBG: No results for input(s): GLUCAP in the last 168 hours. Lipid Profile: No results for input(s): CHOL, HDL, LDLCALC, TRIG, CHOLHDL, LDLDIRECT in the last 72 hours. Thyroid Function Tests: No results for input(s): TSH, T4TOTAL, FREET4, T3FREE, THYROIDAB in the last 72 hours. Anemia Panel: No results for input(s): VITAMINB12, FOLATE, FERRITIN, TIBC, IRON, RETICCTPCT in the last 72 hours. Urine analysis:    Component Value Date/Time   COLORURINE YELLOW 03/02/2018 2331   APPEARANCEUR CLOUDY (A) 03/02/2018 2331   LABSPEC 1.005 03/02/2018 2331  PHURINE 9.0 (H) 03/02/2018 2331   GLUCOSEU NEGATIVE 03/02/2018 2331   HGBUR SMALL (A) 03/02/2018 2331   BILIRUBINUR NEGATIVE 03/02/2018 2331   KETONESUR NEGATIVE 03/02/2018 2331   PROTEINUR NEGATIVE 03/02/2018 2331   UROBILINOGEN 0.2 12/15/2014 1956   NITRITE NEGATIVE 03/02/2018 2331   LEUKOCYTESUR LARGE (A) 03/02/2018 2331    Radiological Exams on Admission: No results found.  EKG: Independently reviewed.  Assessment/Plan Principal Problem:   C. difficile diarrhea Active Problems:   Controlled type 2 diabetes mellitus without complication, without long-term current use of insulin ([facility])   Essential hypertension   Acute lower UTI    1. C.Diff - 1. PO Vanc 2. IVF: 1L bolus in ED and 75 cc/hr NS 3. Hold Lozol 4. Repeat BMP in AM 2. UTI - 1. Rocephin 2. Cultures pending 3. HTN - 1. Continue home BP meds 2. But hold Lozol 4. DM2 - 1. Hold metformin 2. Sensitive SSI AC  DVT prophylaxis: Lovenox Code Status: Full Family Communication: No family in room Disposition Plan: Home after admit Consults called: None Admission status: Place in obs   Alahni Varone, [facility] Pager [phone] Only works nights!  If 7AM-7PM, please contact the  primary day team physician taking care of patient  [email or web address] Password [email or web address]  03/03/2018, 1:53 AM

## 2018-03-03 NOTE — ED Notes (Signed)
ED TO INPATIENT HANDOFF REPORT  Name/Age/Gender [name] 84 y.o. female  Code Status    Code Status Orders  (From admission, onward)         Start     Ordered   03/03/18 0140  Full code  Continuous     03/03/18 0143        Code Status History    Date Active Date Inactive Code Status Order ID Comments User Context   07/03/2016 0142 07/08/2016 1758 Full Code [id number]  Garing, Kendall, MD ED   02/24/2016 1725 02/25/2016 2222 Partial Code [id number]  Guenther, Paula M, NP ED   06/15/2015 0448 06/21/2015 1628 Full Code [id number]  Gardner, Jared M, DO ED   12/15/2014 2233 12/17/2014 1716 Partial Code [id number]  Niu, Xilin, MD Inpatient      Home/SNF/Other Home  Chief Complaint c-dif   Level of Care/Admitting Diagnosis ED Disposition    ED Disposition Condition Comment   Admit  Hospital Area: [facility] [[id number]]  Level of Care: Med-Surg [16]  Diagnosis: C. difficile diarrhea [317897]  Admitting Physician: GARDNER, JARED M [[id number]]  Attending Physician: GARDNER, JARED M [[id number]]  PT Class (Do Not Modify): Observation [104]  PT Acc Code (Do Not Modify): Observation [10022]       Medical History Past Medical History:  Diagnosis Date  . Diabetes mellitus without complication (HCC)   . Hyperlipidemia    a. intolerant to statins.   . Hypertension     Allergies Allergies  Allergen Reactions  . Phenergan [Promethazine Hcl] Other (See Comments)    hallucinations  . Statins Other (See Comments)    Severe myalgias  . Trazodone And Nefazodone Cough  . Prednisone Rash    IV Location/Drains/Wounds Patient Lines/Drains/Airways Status   Active Line/Drains/Airways    Name:   Placement date:   Placement time:   Site:   Days:   Peripheral IV 03/02/18 Left Antecubital   03/02/18    2236    Antecubital   1          Labs/Imaging Results for orders placed or performed during the hospital encounter of 03/02/18 (from the past 48 hour(s))  CBC with  Differential     Status: None   Collection Time: 03/02/18 10:34 PM  Result Value Ref Range   WBC 9.5 4.0 - 10.5 K/uL   RBC 4.63 3.87 - 5.11 MIL/uL   Hemoglobin 13.1 12.0 - 15.0 g/dL   HCT 37.7 36.0 - 46.0 %   MCV 81.4 78.0 - 100.0 fL   MCH 28.3 26.0 - 34.0 pg   MCHC 34.7 30.0 - 36.0 g/dL   RDW 13.5 11.5 - 15.5 %   Platelets 325 150 - 400 K/uL   Neutrophils Relative % 73 %   Neutro Abs 7.0 1.7 - 7.7 K/uL   Lymphocytes Relative 18 %   Lymphs Abs 1.7 0.7 - 4.0 K/uL   Monocytes Relative 8 %   Monocytes Absolute 0.7 0.1 - 1.0 K/uL   Eosinophils Relative 1 %   Eosinophils Absolute 0.1 0.0 - 0.7 K/uL   Basophils Relative 0 %   Basophils Absolute 0.0 0.0 - 0.1 K/uL    Comment: Performed at [facility], [address] [place], [place] [address]  Comprehensive metabolic panel     Status: Abnormal   Collection Time: 03/02/18 10:34 PM  Result Value Ref Range   Sodium 129 (L) 135 - 145 mmol/L   Potassium 3.9 3.5 - 5.1 mmol/L     Chloride 88 (L) 98 - 111 mmol/L   CO2 26 22 - 32 mmol/L   Glucose, Bld 152 (H) 70 - 99 mg/dL   BUN 18 8 - 23 mg/dL   Creatinine, Ser 0.79 0.44 - 1.00 mg/dL   Calcium 9.5 8.9 - 10.3 mg/dL   Total Protein 7.5 6.5 - 8.1 g/dL   Albumin 4.3 3.5 - 5.0 g/dL   AST 18 15 - 41 U/L   ALT 13 0 - 44 U/L   Alkaline Phosphatase 77 38 - 126 U/L   Total Bilirubin 0.9 0.3 - 1.2 mg/dL   GFR calc non Af Amer >60 >60 mL/min   GFR calc Af Amer >60 >60 mL/min    Comment: (NOTE) The eGFR has been calculated using the CKD EPI equation. This calculation has not been validated in all clinical situations. eGFR's persistently <60 mL/min signify possible Chronic Kidney Disease.    Anion gap 15 5 - 15    Comment: Performed at [facility], [address], [place], [place] [address]  Lipase, blood     Status: None   Collection Time: 03/02/18 10:34 PM  Result Value Ref Range   Lipase 33 11 - 51 U/L    Comment: Performed at [facility], [place] [address], [place], [place] [address]  I-Stat CG4 Lactic Acid, ED     Status: None   Collection Time: 03/02/18 10:44 PM  Result Value Ref Range   Lactic Acid, Venous 1.89 0.5 - 1.9 mmol/L  Urinalysis, Routine w reflex microscopic     Status: Abnormal   Collection Time: 03/02/18 11:31 PM  Result Value Ref Range   Color, Urine YELLOW YELLOW   APPearance CLOUDY (A) CLEAR   Specific Gravity, Urine 1.005 1.005 - 1.030   pH 9.0 (H) 5.0 - 8.0   Glucose, UA NEGATIVE NEGATIVE mg/dL   Hgb urine dipstick SMALL (A) NEGATIVE   Bilirubin Urine NEGATIVE NEGATIVE   Ketones, ur NEGATIVE NEGATIVE mg/dL   Protein, ur NEGATIVE NEGATIVE mg/dL   Nitrite NEGATIVE NEGATIVE   Leukocytes, UA LARGE (A) NEGATIVE   RBC / HPF 11-20 0 - 5 RBC/hpf   WBC, UA >50 (H) 0 - 5 WBC/hpf   Bacteria, UA MANY (A) NONE SEEN   Squamous Epithelial / LPF 0-5 0 - 5   WBC Clumps PRESENT    Mucus PRESENT     Comment: Performed at [facility], [place] [address], [place], [place] [address]   No results found.  Pending Labs Unresulted Labs (From admission, onward)    Start     Ordered   03/03/18 7510  Basic metabolic panel  Tomorrow morning,   R     03/03/18 0143   03/02/18 2200  Urine culture  STAT,   STAT     03/02/18 2159          Vitals/Pain Today's Vitals   03/02/18 2145 03/03/18 0106  BP: (!) 155/62 (!) 150/60  Pulse: 70 71  Resp: 18 17  Temp: 98.5 F (36.9 C) 98.5 F (36.9 C)  TempSrc: Oral Oral  SpO2: 98% 100%  PainSc:  0-No pain    Isolation Precautions No active isolations  Medications Medications  vancomycin (VANCOCIN) 50 mg/mL oral solution 125 mg (125 mg Oral Given 03/03/18 0105)  acetaminophen (TYLENOL) tablet 650 mg (has no administration in time range)    Or  acetaminophen (TYLENOL) suppository 650 mg (has no administration in time range)  ondansetron (ZOFRAN) tablet  4 mg (has no administration in time range)    Or  ondansetron (ZOFRAN) injection 4 mg (has  no administration in time range)  0.9 %  sodium chloride infusion (has no administration in time range)  enoxaparin (LOVENOX) injection 40 mg (has no administration in time range)  amLODipine (NORVASC) tablet 10 mg (has no administration in time range)  beta carotene w/minerals (OCUVITE) tablet 1 tablet (has no administration in time range)  Cyanocobalamin TABS 2,500 mcg (has no administration in time range)  hydrALAZINE (APRESOLINE) tablet 10 mg (has no administration in time range)  hydrOXYzine (ATARAX/VISTARIL) tablet 25 mg (has no administration in time range)  polyvinyl alcohol (LIQUIFILM TEARS) 1.4 % ophthalmic solution 1 drop (has no administration in time range)  losartan (COZAAR) tablet 100 mg (has no administration in time range)  insulin aspart (novoLOG) injection 0-9 Units (has no administration in time range)  cefTRIAXone (ROCEPHIN) 1 g in sodium chloride 0.9 % 100 mL IVPB (has no administration in time range)  fentaNYL (SUBLIMAZE) injection 12.5-25 mcg (has no administration in time range)  sodium chloride 0.9 % bolus 1,000 mL (1,000 mLs Intravenous New Bag/Given 03/02/18 2245)  ondansetron (ZOFRAN) injection 4 mg (4 mg Intravenous Given 03/02/18 2244)    Mobility walks with device

## 2018-03-03 NOTE — Progress Notes (Signed)
Patient ID: [name], female   DOB: [birth year], 84 y.o.   MRN: [id number] Patient was admitted early this morning for abdominal pain, vomiting and diarrhea.  She was found to be have C. difficile antigen and PCR positive, toxin negative.  She was started on oral vancomycin because of symptoms of diarrhea.  She was also started on IV Rocephin for UTI.  Patient seen and examined at bedside and plan of care discussed with her and her son.  Medical records including this morning's H&P was reviewed by myself.  Continue current antibiotics including oral vancomycin.  Follow cultures.  Use short course of Rocephin.  Repeat a.m. labs.

## 2018-03-04 DIAGNOSIS — E871 Hypo-osmolality and hyponatremia: Secondary | ICD-10-CM

## 2018-03-04 DIAGNOSIS — N390 Urinary tract infection, site not specified: Secondary | ICD-10-CM

## 2018-03-04 DIAGNOSIS — E119 Type 2 diabetes mellitus without complications: Secondary | ICD-10-CM

## 2018-03-04 DIAGNOSIS — I10 Essential (primary) hypertension: Secondary | ICD-10-CM

## 2018-03-04 DIAGNOSIS — A0472 Enterocolitis due to Clostridium difficile, not specified as recurrent: Principal | ICD-10-CM

## 2018-03-04 DIAGNOSIS — R5383 Other fatigue: Secondary | ICD-10-CM

## 2018-03-04 DIAGNOSIS — E860 Dehydration: Secondary | ICD-10-CM

## 2018-03-04 LAB — COMPREHENSIVE METABOLIC PANEL
ALBUMIN: 3.4 g/dL — AB (ref 3.5–5.0)
ALK PHOS: 58 U/L (ref 38–126)
ALT: 12 U/L (ref 0–44)
AST: 13 U/L — AB (ref 15–41)
Anion gap: 8 (ref 5–15)
BILIRUBIN TOTAL: 0.6 mg/dL (ref 0.3–1.2)
BUN: 16 mg/dL (ref 8–23)
CALCIUM: 9.2 mg/dL (ref 8.9–10.3)
CO2: 29 mmol/L (ref 22–32)
CREATININE: 0.74 mg/dL (ref 0.44–1.00)
Chloride: 103 mmol/L (ref 98–111)
GFR calc Af Amer: >60 mL/min (ref >60)
GLUCOSE: 127 mg/dL — AB (ref 70–99)
Potassium: 4.5 mmol/L (ref 3.5–5.1)
Sodium: 140 mmol/L (ref 135–145)
TOTAL PROTEIN: 6 g/dL — AB (ref 6.5–8.1)

## 2018-03-04 LAB — CBC WITH DIFFERENTIAL/PLATELET
BASOS ABS: 0 10*3/uL (ref 0.0–0.1)
BASOS PCT: 0 %
Eosinophils Absolute: 0.2 10*3/uL (ref 0.0–0.7)
Eosinophils Relative: 3 %
HEMATOCRIT: 35.6 % — AB (ref 36.0–46.0)
HEMOGLOBIN: 11.8 g/dL — AB (ref 12.0–15.0)
LYMPHS PCT: 29 %
Lymphs Abs: 2 10*3/uL (ref 0.7–4.0)
MCH: 28.4 pg (ref 26.0–34.0)
MCHC: 33.1 g/dL (ref 30.0–36.0)
MCV: 85.6 fL (ref 78.0–100.0)
Monocytes Absolute: 0.9 10*3/uL (ref 0.1–1.0)
Monocytes Relative: 13 %
NEUTROS ABS: 3.7 10*3/uL (ref 1.7–7.7)
NEUTROS PCT: 55 %
Platelets: 278 10*3/uL (ref 150–400)
RBC: 4.16 MIL/uL (ref 3.87–5.11)
RDW: 14.1 % (ref 11.5–15.5)
WBC: 6.9 10*3/uL (ref 4.0–10.5)

## 2018-03-04 LAB — GLUCOSE, CAPILLARY
GLUCOSE-CAPILLARY: 119 mg/dL — AB (ref 70–99)
Glucose-Capillary: 117 mg/dL — ABNORMAL HIGH (ref 70–99)
Glucose-Capillary: 143 mg/dL — ABNORMAL HIGH (ref 70–99)
Glucose-Capillary: 112 mg/dL — ABNORMAL HIGH (ref 70–99)

## 2018-03-04 LAB — OVA + PARASITE EXAM

## 2018-03-04 LAB — MAGNESIUM: MAGNESIUM: 1.6 mg/dL — AB (ref 1.7–2.4)

## 2018-03-04 LAB — O&P RESULT

## 2018-03-04 MED ORDER — VANCOMYCIN HCL 125 MG PO CAPS: 125.0000 mg | ORAL_CAPSULE | Freq: Four times a day (QID) | ORAL | 0 refills | Status: DC

## 2018-03-04 MED ORDER — MAGNESIUM SULFATE 2 GM/50ML IV SOLN
2.0000 g | Freq: Once | INTRAVENOUS | Status: AC
  Administered 2018-03-04: 2 g via INTRAVENOUS
  Filled 2018-03-04: qty 50

## 2018-03-04 MED ORDER — HYDRALAZINE HCL 10 MG PO TABS
10.0000 mg | ORAL_TABLET | Freq: Three times a day (TID) | ORAL | Status: DC
  Administered 2018-03-04 – 2018-03-05 (×5): 10 mg via ORAL
  Filled 2018-03-04 (×5): qty 1

## 2018-03-04 MED ORDER — GLUCERNA SHAKE PO LIQD
237.0000 mL | Freq: Three times a day (TID) | ORAL | Status: DC
  Administered 2018-03-04 – 2018-03-05 (×5): 237 mL via ORAL
  Filled 2018-03-04 (×6): qty 237

## 2018-03-04 NOTE — Progress Notes (Signed)
Pharmacy - Brief Note Vancomycin cost at discharge  Vancomycin 125mg  caps copay $285 Suggested alternative: - [place] makes oral solution that is much less expensive  Doreene Eland, PharmD, BCPS.    03/04/2018 1:29 PM

## 2018-03-04 NOTE — Evaluation (Signed)
Physical Therapy Evaluation Patient Details Name: [name] MRN: [id number] DOB: 10/06/1931 Today's Date: 03/04/2018   History of Present Illness  Pt admitted with N/V/D and c/o abdominal pain.  Pt with hx of DM  Clinical Impression  Pt admitted as above and presenting with functional mobility limitations 2* generalized weakness and mild ambulatory balance deficits.  Pt should progress to dc home with intermittent family assist and would benefit from follow up HHPT to further address deficits.    Follow Up Recommendations Home health PT    Equipment Recommendations  Rolling walker with 5" wheels    Recommendations for Other Services       Precautions / Restrictions Precautions Precautions: Fall Restrictions Weight Bearing Restrictions: No      Mobility  Bed Mobility               General bed mobility comments: NT - Pt up in chair and requests back to same  Transfers Overall transfer level: Needs assistance Equipment used: None Transfers: Sit to/from Stand Sit to Stand: Supervision            Ambulation/Gait Ambulation/Gait assistance: Min guard;Supervision Gait Distance (Feet): 500 Feet Assistive device: Rolling walker (2 wheeled);None Gait Pattern/deviations: Step-through pattern;Decreased step length - right;Decreased step length - left;Shuffle;Wide base of support Gait velocity: mod pace   General Gait Details: 400' with RW, cues for posture and position from RW, and Sup.  100' with min guard sans AD - pt stable and with no LOB but states she does not feel steady/safe  Stairs            Wheelchair Mobility    Modified Rankin (Stroke Patients Only)       Balance Overall balance assessment: Needs assistance Sitting-balance support: Feet supported;No upper extremity supported Sitting balance-Leahy Scale: Normal     Standing balance support: No upper extremity supported Standing balance-Leahy Scale: Fair                                Pertinent Vitals/Pain Pain Assessment: No/denies pain    Home Living Family/patient expects to be discharged to:: Private residence Living Arrangements: Alone Available Help at Discharge: Family;Available PRN/intermittently Type of Home: House Home Access: Stairs to enter Entrance Stairs-Rails: Right Entrance Stairs-Number of Steps: 3 Home Layout: Two level Home Equipment: Cane - single point Additional Comments: Pt walks up to a mile a day - uses cane out side    Prior Function Level of Independence: Independent;Independent with assistive device(s)         Comments: Pt was doing house keeping and driving     Hand Dominance        Extremity/Trunk Assessment   Upper Extremity Assessment Upper Extremity Assessment: Generalized weakness    Lower Extremity Assessment Lower Extremity Assessment: Generalized weakness    Cervical / Trunk Assessment Cervical / Trunk Assessment: Normal  Communication   Communication: No difficulties  Cognition Arousal/Alertness: Awake/alert Behavior During Therapy: WFL for tasks assessed/performed Overall Cognitive Status: Within Functional Limits for tasks assessed                                 General Comments: anxious about being home alone.  Considering IND living      General Comments      Exercises     Assessment/Plan    PT Assessment Patient needs continued PT  services  PT Problem List Decreased strength;Decreased activity tolerance;Decreased balance;Decreased mobility;Decreased knowledge of use of DME;Pain       PT Treatment Interventions DME instruction;Gait training;Stair training;Therapeutic exercise;Functional mobility training;Therapeutic activities;Balance training;Patient/family education    PT Goals (Current goals can be found in the Care Plan section)  Acute Rehab PT Goals Patient Stated Goal: Regain IND PT Goal Formulation: With patient Time For Goal Achievement:  03/18/18 Potential to Achieve Goals: Good    Frequency Min 3X/week   Barriers to discharge        Co-evaluation               AM-PAC PT "6 Clicks" Daily Activity  Outcome Measure Difficulty turning over in bed (including adjusting bedclothes, sheets and blankets)?: A Little Difficulty moving from lying on back to sitting on the side of the bed? : A Little Difficulty sitting down on and standing up from a chair with arms (e.g., wheelchair, bedside commode, etc,.)?: A Little Help needed moving to and from a bed to chair (including a wheelchair)?: A Little Help needed walking in hospital room?: A Little Help needed climbing 3-5 steps with a railing? : A Little 6 Click Score: 18    End of Session Equipment Utilized During Treatment: Gait belt Activity Tolerance: Patient tolerated treatment well Patient left: in chair;with call bell/phone within reach Nurse Communication: Mobility status PT Visit Diagnosis: Unsteadiness on feet (R26.81);Muscle weakness (generalized) (M62.81)    Time: 2820-6015 PT Time Calculation (min) (ACUTE ONLY): 28 min   Charges:   PT Evaluation $PT Eval Low Complexity: 1 Low PT Treatments $Gait Training: 8-22 mins        Pg [phone]   Addyson Traub 03/04/2018, 1:17 PM

## 2018-03-04 NOTE — Care Management Note (Signed)
Case Management Note  Patient Details  Name: [name] MRN: [id number] Date of Birth: 08-09-1931  Subjective/Objective:     Discharge planning, spoke with patient at beside. Chose [facility], PT to eval and treat.   Action/Plan: Contacted [facility] for referral. Also needs a RW, contacted [facility] to deliver to the room. [phone]   Expected Discharge Date:                  Expected Discharge Plan:  [place]  In-House Referral:  NA  Discharge planning Services  CM Consult  Post Acute Care Choice:  Durable Medical Equipment, Home Health Choice offered to:  Patient  DME Arranged:  Walker rolling DME Agency:  [place]:  PT [facility] Agency:  [place]  Status of Service:  Completed, signed off  If discussed at [place] of Stay Meetings, dates discussed:    Additional Comments:  Guadalupe Maple, RN 03/04/2018, 1:44 PM

## 2018-03-04 NOTE — Progress Notes (Signed)
PROGRESS NOTE    [name]   QVZ:[id number]  DOB: 1931/09/16  DOA: 03/02/2018 PCP: Kathyrn Drown, MD   Brief Narrative:  WILLIS KUIPERS is a 84 y.o. female with medical history significant of DM2, HTN.  She underwent treatment with Cipro for UTI ~ 6 weeks ago. About 4 weeks ago she developed diarrhea, abd pain and N/V.  Stool showed C.Diff infection on 8/30 and she was started on Flagyl by her PCP.  Despite taking Flagyl x 2 days, symptoms have progressed. In ED> Sodium 129, UA + for UTI   Subjective: Still is nauseated. Diarrhea is improving. Abdominal pain is minimal.  She feels very fatigues.   Assessment & Plan:   Principal Problem:   C. difficile diarrhea - antigen +, toxin negative and PCR + on 8/30 - failed Flagyl- cont Vancomycin- appears to be steadily improving but oral intake still poor - cont IV hydration until oral intake improves- add Glucerna - occurrence is likely due to recent use of antibiotics for a UTI- unfortunately she is symptomatic with another UTI at this time and needs treatment- will need to extend course for C diff for 10 days after treatment of UTI is completed  Active Problems:  Hyponatremia - sodium 129 - now 140- likely due to dehydration/ diarrhea- cont slow NS  Hypomagnesemia - replace   Hypokalemia - replaced    Acute lower UTI- E coli -she admits to dysuria and increased urinary frequency associated with the + UA on admission- will need to continue Ceftriaxone for now- sensitivities pending    Controlled type 2 diabetes mellitus without complication, without long-term current use of insulin  - cont SSI    Essential hypertension -cont Cozaar, Hydralzaine and Amlodipine   DVT prophylaxis: Lovenox Code Status: Full code Family Communication:  Disposition Plan: home Consultants:   none Procedures:   None Antimicrobials:  Anti-infectives (From admission, onward)   Start     Dose/Rate Route Frequency Ordered Stop   03/03/18 0215  cefTRIAXone (ROCEPHIN) 1 g in sodium chloride 0.9 % 100 mL IVPB     1 g 200 mL/hr over 30 Minutes Intravenous Daily at bedtime 03/03/18 0148     03/02/18 2330  vancomycin (VANCOCIN) 50 mg/mL oral solution 125 mg     125 mg Oral 4 times daily 03/02/18 2321 03/12/18 2159       Objective: Vitals:   03/03/18 1310 03/03/18 1646 03/03/18 2142 03/04/18 0436  BP: (!) 140/55 (!) 130/53 (!) 115/54 (!) 145/56  Pulse: 75 72 66 64  Resp:   20 20  Temp: 98.1 F (36.7 C) (!) 97.3 F (36.3 C) 97.7 F (36.5 C) 98 F (36.7 C)  TempSrc: Oral Oral Oral Oral  SpO2: 98% 98% 96% 97%  Weight:      Height:        Intake/Output Summary (Last 24 hours) at 03/04/2018 0844 Last data filed at 03/04/2018 3295 Gross per 24 hour  Intake 2026.82 ml  Output -  Net 2026.82 ml   Filed Weights   03/03/18 0322  Weight: 73.9 kg    Examination: General exam: Appears comfortable  HEENT: PERRLA, oral mucosa moist, no sclera icterus or thrush Respiratory system: Clear to auscultation. Respiratory effort normal. Cardiovascular system: S1 & S2 heard, RRR.   Gastrointestinal system: Abdomen soft, mildly tender across lower abdomen, nondistended. Normal bowel sound. No organomegaly Central nervous system: Alert and oriented. No focal neurological deficits. Extremities: No cyanosis, clubbing or edema Skin: No rashes  or ulcers Psychiatry:  Appears anxious.    Data Reviewed: I have personally reviewed following labs and imaging studies  CBC: Recent Labs  Lab 02/26/18 1422 02/27/18 0819 03/02/18 2234 03/04/18 0536  WBC 8.8 8.0 9.5 6.9  NEUTROABS 5.4 6.0 7.0 3.7  HGB 12.4 13.0 13.1 11.8*  HCT 38.8 39.3 37.7 35.6*  MCV 87.2 86.8 81.4 85.6  PLT 297 279 325 229   Basic Metabolic Panel: Recent Labs  Lab 02/26/18 1422 02/27/18 0819 03/02/18 2234 03/03/18 0445 03/04/18 0536  NA 137 139 129* 132* 140  K 3.8 4.3 3.9 3.4* 4.5  CL 98 98 88* 95* 103  CO2 28 29 26 26 29   GLUCOSE 100* 165*  152* 164* 127*  BUN 16 17 18 14 16   CREATININE 0.82 0.81 0.79 0.81 0.74  CALCIUM 9.6 10.1 9.5 8.9 9.2  MG  --   --   --   --  1.6*   GFR: Estimated Creatinine Clearance: 52.8 mL/min (by C-G formula based on SCr of 0.74 mg/dL). Liver Function Tests: Recent Labs  Lab 02/26/18 1422 03/02/18 2234 03/04/18 0536  AST 16 18 13*  ALT 12 13 12   ALKPHOS 84 77 58  BILITOT 0.9 0.9 0.6  PROT 7.3 7.5 6.0*  ALBUMIN 3.9 4.3 3.4*   Recent Labs  Lab 03/02/18 2234  LIPASE 33   No results for input(s): AMMONIA in the last 168 hours. Coagulation Profile: No results for input(s): INR, PROTIME in the last 168 hours. Cardiac Enzymes: No results for input(s): CKTOTAL, CKMB, CKMBINDEX, TROPONINI in the last 168 hours. BNP (last 3 results) No results for input(s): PROBNP in the last 8760 hours. HbA1C: No results for input(s): HGBA1C in the last 72 hours. CBG: Recent Labs  Lab 03/03/18 0935 03/03/18 1156 03/03/18 1704 03/03/18 2137 03/04/18 0754  GLUCAP 134* 114* 97 106* 117*   Lipid Profile: No results for input(s): CHOL, HDL, LDLCALC, TRIG, CHOLHDL, LDLDIRECT in the last 72 hours. Thyroid Function Tests: No results for input(s): TSH, T4TOTAL, FREET4, T3FREE, THYROIDAB in the last 72 hours. Anemia Panel: No results for input(s): VITAMINB12, FOLATE, FERRITIN, TIBC, IRON, RETICCTPCT in the last 72 hours. Urine analysis:    Component Value Date/Time   COLORURINE YELLOW 03/02/2018 2331   APPEARANCEUR CLOUDY (A) 03/02/2018 2331   LABSPEC 1.005 03/02/2018 2331   PHURINE 9.0 (H) 03/02/2018 2331   GLUCOSEU NEGATIVE 03/02/2018 2331   HGBUR SMALL (A) 03/02/2018 2331   BILIRUBINUR NEGATIVE 03/02/2018 2331   KETONESUR NEGATIVE 03/02/2018 2331   PROTEINUR NEGATIVE 03/02/2018 2331   UROBILINOGEN 0.2 12/15/2014 1956   NITRITE NEGATIVE 03/02/2018 2331   LEUKOCYTESUR LARGE (A) 03/02/2018 2331   Sepsis Labs: @LABRCNTIP (procalcitonin:4,lacticidven:4) ) Recent Results (from the past 240  hour(s))  Stool culture (children & immunocomp patients)     Status: None   Collection Time: 02/27/18 11:43 AM  Result Value Ref Range Status   Salmonella/Shigella Screen Final report  Final   Campylobacter Culture Final report  Final   E coli, Shiga toxin Assay Negative Negative Final    Comment: (NOTE) Performed At: [facility] [address] [place], [place] [id number] Rush Farmer MD [id number]   OVA + PARASITE EXAM     Status: None   Collection Time: 02/27/18 11:43 AM  Result Value Ref Range Status   OVA + PARASITE EXAM Final report  Final    Comment: (NOTE) These results were obtained using wet preparation(s) and trichrome stained smear. This test does not include testing for  Cryptosporidium parvum, Cyclospora, or Microsporidia. Performed At: [facility] [place], [place] [id number] Rush Farmer MD [id number]    Source of Sample STOOL  Final    Comment: Performed at [facility], [address], [place], [place] [address]  STOOL CULTURE REFLEX - RSASHR     Status: None   Collection Time: 02/27/18 11:43 AM  Result Value Ref Range Status   Stool Culture result 1 (RSASHR) Comment  Final    Comment: (NOTE) No Salmonella or Shigella recovered. Performed At: [facility] [address] [place], [place] [id number] Rush Farmer MD [id number]   STOOL CULTURE Reflex - CMPCXR     Status: None   Collection Time: 02/27/18 11:43 AM  Result Value Ref Range Status   Stool Culture result 1 (CMPCXR) Comment  Final    Comment: (NOTE) No Campylobacter species isolated. Performed At: [facility] [place], [place] [id number] Rush Farmer MD [id number]   C Difficile Quick Screen w PCR reflex     Status: Abnormal   Collection Time: 02/27/18 11:46 AM  Result Value Ref Range Status   C Diff antigen POSITIVE (A) NEGATIVE Final   C Diff toxin NEGATIVE NEGATIVE Final   C Diff interpretation Results are  indeterminate. See PCR results.  Final    Comment: Performed at [facility], [address], [place], [place] [address]  C. Diff by PCR, Reflexed     Status: Abnormal   Collection Time: 02/27/18 11:46 AM  Result Value Ref Range Status   Toxigenic C. Difficile by PCR POSITIVE (A) NEGATIVE Final    Comment: Positive for toxigenic C. difficile with little to no toxin production. Only treat if clinical presentation suggests symptomatic illness. Performed at [facility], [place] [address], [place], [place] [address]   Urine culture     Status: Abnormal (Preliminary result)   Collection Time: 03/02/18 11:31 PM  Result Value Ref Range Status   Specimen Description   Final    URINE, RANDOM Performed at [facility] [address], [place], [place] [address]    Special Requests   Final    NONE Performed at [facility], [place] [address] [place], [place] [address]    Culture >=100,000 COLONIES/mL GRAM NEGATIVE RODS (A)  Final   Report Status PENDING  Incomplete         Radiology Studies: No results found.    Scheduled Meds: . amLODipine  10 mg Oral Daily  . enoxaparin (LOVENOX) injection  40 mg Subcutaneous Daily  . hydrALAZINE  10 mg Oral TID  . insulin aspart  0-9 Units Subcutaneous TID WC  . losartan  100 mg Oral Daily  . multivitamin  1 tablet Oral Daily  . polyvinyl alcohol  1 drop Both Eyes TID  . vancomycin  125 mg Oral QID  . vitamin B-12  2,500 mcg Oral Daily   Continuous Infusions: . sodium chloride 75 mL/hr at 03/04/18 0541  . cefTRIAXone (ROCEPHIN)  IV Stopped (03/03/18 2153)  . magnesium sulfate 1 - 4 g bolus IVPB       LOS: 1 day    Time spent in minutes: 40    Debbe Odea, MD [facility] Pager: [email or web address] [facility] 03/04/2018, 8:44 AM

## 2018-03-05 ENCOUNTER — Telehealth: Payer: Self-pay | Admitting: Family Medicine

## 2018-03-05 LAB — BASIC METABOLIC PANEL
ANION GAP: 11 (ref 5–15)
BUN: 14 mg/dL (ref 8–23)
CHLORIDE: 105 mmol/L (ref 98–111)
CO2: 24 mmol/L (ref 22–32)
Calcium: 9 mg/dL (ref 8.9–10.3)
Creatinine, Ser: 0.74 mg/dL (ref 0.44–1.00)
GFR calc non Af Amer: >60 mL/min (ref >60)
GLUCOSE: 136 mg/dL — AB (ref 70–99)
POTASSIUM: 3.8 mmol/L (ref 3.5–5.1)
Sodium: 140 mmol/L (ref 135–145)

## 2018-03-05 LAB — GLUCOSE, CAPILLARY
GLUCOSE-CAPILLARY: 156 mg/dL — AB (ref 70–99)
Glucose-Capillary: 119 mg/dL — ABNORMAL HIGH (ref 70–99)

## 2018-03-05 LAB — URINE CULTURE

## 2018-03-05 LAB — MAGNESIUM: Magnesium: 1.7 mg/dL (ref 1.7–2.4)

## 2018-03-05 MED ORDER — GLUCERNA SHAKE PO LIQD: 237.0000 mL | Freq: Three times a day (TID) | ORAL | 0 refills | Status: DC

## 2018-03-05 MED ORDER — VANCOMYCIN 50 MG/ML ORAL SOLUTION: 125.0000 mg | Freq: Four times a day (QID) | ORAL | 0 refills | Status: DC

## 2018-03-05 MED FILL — VANCOMYCIN 50MG/ML ORAL SOL: 10 days supply | Qty: 100 | Fill #0

## 2018-03-05 NOTE — Telephone Encounter (Signed)
Please let this patient know that we are aware that she was in the hospital Please also let her know we would like to see her next week at her follow-up appointment on the 11th If she feels she needs to be seen sooner we can work her and she needs to let us know that She needs to bring all of her medications with her. Please also ask her if there are any needs that she has currently.

## 2018-03-05 NOTE — Discharge Summary (Addendum)
Physician Discharge Summary  [name] PQD:[id number] DOB: 25-Aug-1931 DOA: 03/02/2018  PCP: Kathyrn Drown, MD  Admit date: 03/02/2018 Discharge date: 03/05/2018  Admitted From: home  Disposition:  home   Recommendations for Outpatient Follow-up:  1. Recommend reduced use of anitbiotics over the next 3 months (at least) to prevent recurrence of C diff.  Discharge Condition:  stable   CODE STATUS:  Full code   Consultations:  none   Discharge Diagnoses:  Principal Problem:   C. difficile diarrhea Active Problems:   Acute lower UTI   Controlled type 2 diabetes mellitus without complication, without long-term current use of insulin (HCC)   Essential hypertension   Brief Summary: [name] is a 84 y.o.femalewith medical history significant ofDM2, HTN. She underwent treatment with Cipro for UTI ~ 6 weeks ago. About 4 weeks ago she developed diarrhea, abd pain and N/V. Stool showed C.Diff infection on 8/30 and she was started on Flagyl by her PCP. Despite taking Flagyl x 2 days, symptoms have progressed. In ED> Sodium 129, UA + for UTI    Hospital Course:  Principal Problem:   C. difficile diarrhea - antigen +, toxin negative and PCR + on 8/30 - failed Flagyl as outpt - started on Vancomycin-  Diarrhea resolved quickly and nausea a bit more slowly- she is able to eat now without nausea. She is having small formed BMs for the past 2 days and no abdominal pain. - the occurrence is likely due to recent use of antibiotics for a UTI- unfortunately she is symptomatic with another UTI at this time and needs treatment- will extend course for C diff for 10 days after treatment of UTI is completed (as of today)    Active Problems:  Hyponatremia - sodium 129 - now 140 after IVF and improvement in GI symptoms - likely due to dehydration/ diarrhea     Acute lower UTI- E coli -she admits to dysuria and increased urinary frequency associated with the + UA on admission-  urine is sensitive to Ceftriaxone which she has been receiving & resistant to Amp & Cipro (which she states she previously received) - symptoms have resolved- she has completed her course of antibiotics  Hypomagnesemia - replaced  Hypokalemia - replaced    Controlled type 2 diabetes mellitus without complication, without long-term current use of insulin  - cont Metformin    Essential hypertension -cont Cozaar, Hydralzaine and Amlodipine   Discharge Exam: Vitals:   03/05/18 1000 03/05/18 1041  BP: (!) 150/60 (!) 137/59  Pulse:  74  Resp:  16  Temp:  98 F (36.7 C)  SpO2:  100%   Vitals:   03/04/18 2130 03/05/18 0539 03/05/18 1000 03/05/18 1041  BP: (!) 137/54 (!) 136/49 (!) 150/60 (!) 137/59  Pulse: 60 (!) 59  74  Resp: 16 16  16   Temp: 97.7 F (36.5 C) 97.8 F (36.6 C)  98 F (36.7 C)  TempSrc: Oral Oral  Oral  SpO2: 97% 98%  100%  Weight:      Height:        General: Pt is alert, awake, not in acute distress Cardiovascular: RRR, S1/S2 +, no rubs, no gallops Respiratory: CTA bilaterally, no wheezing, no rhonchi Abdominal: Soft, NT, ND, bowel sounds + Extremities: no edema, no cyanosis   Discharge Instructions  Discharge Instructions    Diet - low sodium heart healthy   Complete by:  As directed    Increase activity slowly   Complete by:  As directed      Allergies as of 03/05/2018      Reactions   Phenergan [promethazine Hcl] Other (See Comments)   hallucinations   Statins Other (See Comments)   Severe myalgias   Trazodone And Nefazodone Cough   Prednisone Rash      Medication List    STOP taking these medications   hydrOXYzine 25 MG tablet Commonly known as:  ATARAX/VISTARIL     TAKE these medications   acetaminophen 500 MG tablet Commonly known as:  TYLENOL Take 500-1,000 mg by mouth every 6 (six) hours as needed for mild pain or moderate pain.   amLODipine 10 MG tablet Commonly known as:  NORVASC Take one tablet daily What  changed:    how much to take  how to take this  when to take this  additional instructions   beta carotene w/minerals tablet Take 1 tablet by mouth daily.   Cyanocobalamin 2500 MCG Tabs Take 2,500 mcg by mouth daily.   feeding supplement (GLUCERNA SHAKE) Liqd Take 237 mLs by mouth 3 (three) times daily between meals.   hydrALAZINE 10 MG tablet Commonly known as:  APRESOLINE Take 1 tablet (10 mg total) by mouth 2 (two) times daily. One bid What changed:    when to take this  additional instructions   indapamide 2.5 MG tablet Commonly known as:  LOZOL 1 qam What changed:    how much to take  how to take this  when to take this  additional instructions   losartan 100 MG tablet Commonly known as:  COZAAR TAKE 1 TABLET BY MOUTH ONCE DAILY   metFORMIN 1000 MG tablet Commonly known as:  GLUCOPHAGE TAKE 1 TABLET BY MOUTH TWICE DAILY   polyvinyl alcohol 1.4 % ophthalmic solution Commonly known as:  LIQUIFILM TEARS Place 1 drop into both eyes 3 (three) times daily.   vancomycin 50 mg/mL  oral solution Commonly known as:  VANCOCIN Take 2.5 mLs (125 mg total) by mouth 4 (four) times daily.            Durable Medical Equipment  (From admission, onward)         Start     Ordered   03/04/18 1433  For home use only DME Walker rolling  Once    Question:  Patient needs a walker to treat with the following condition  Answer:  Weakness generalized   03/04/18 [facility] Follow up.   Why:  walker Contact information: [address] [place] [address] [phone]        [facility] Follow [facility].   Specialty:  Home Health Services Why:  physical therapy Contact information: [place] [address] [phone]          Allergies  Allergen Reactions  . Phenergan [Promethazine Hcl] Other (See Comments)    hallucinations  . Statins Other (See  Comments)    Severe myalgias  . Trazodone And Nefazodone Cough  . Prednisone Rash     Procedures/Studies:    Ct Abdomen Pelvis W Contrast  Result Date: 02/27/2018 CLINICAL DATA:  Abdominal distention. EXAM: CT ABDOMEN AND PELVIS WITH CONTRAST TECHNIQUE: Multidetector CT imaging of the abdomen and pelvis was performed using the standard protocol following bolus administration of intravenous contrast. CONTRAST:  126mL ISOVUE-300 IOPAMIDOL (ISOVUE-300) INJECTION 61% COMPARISON:  07/02/2016 FINDINGS: Lower chest: Mild reticulation and right  middle lobe scarring. No acute finding Hepatobiliary: No focal liver abnormality.Layering gallstones. No acute inflammation. Pancreas: Unremarkable. Spleen: Unremarkable. Adrenals/Urinary Tract: Negative adrenals. No hydronephrosis or stone. Unremarkable bladder. Stomach/Bowel: No obstruction. Appendectomy. No inflammatory changes. Vascular/Lymphatic: No acute vascular abnormality. Atherosclerotic calcification with at least moderate narrowing at the celiac origin. There is good patency of the proximal SMA. No mass or adenopathy. Reproductive:Hysterectomy with a degree of pelvic floor laxity. If remaining, negative adnexae. Other: No ascites or pneumoperitoneum. Ventral hernia repair using mesh Musculoskeletal: No acute abnormalities. Scoliosis and lumbar dextrocurvature. IMPRESSION: 1. No acute finding. 2. Cholelithiasis. Electronically Signed   By: Monte Fantasia M.D.   On: 02/27/2018 09:48     The results of significant diagnostics from this hospitalization (including imaging, microbiology, ancillary and laboratory) are listed below for reference.     Microbiology: Recent Results (from the past 240 hour(s))  Stool culture (children & immunocomp patients)     Status: None   Collection Time: 02/27/18 11:43 AM  Result Value Ref Range Status   Salmonella/Shigella Screen Final report  Final   Campylobacter Culture Final report  Final   E coli, Shiga toxin  Assay Negative Negative Final    Comment: (NOTE) Performed At: [facility] [address] [place], [place] [id number] Rush Farmer MD [id number]   OVA + PARASITE EXAM     Status: None   Collection Time: 02/27/18 11:43 AM  Result Value Ref Range Status   OVA + PARASITE EXAM Final report  Final    Comment: (NOTE) These results were obtained using wet preparation(s) and trichrome stained smear. This test does not include testing for Cryptosporidium parvum, Cyclospora, or Microsporidia. Performed At: [facility] [place], [place] [id number] Rush Farmer MD [id number]    Source of Sample STOOL  Final    Comment: Performed at [facility], [address] [place], [place] [address]  STOOL CULTURE REFLEX - RSASHR     Status: None   Collection Time: 02/27/18 11:43 AM  Result Value Ref Range Status   Stool Culture result 1 (RSASHR) Comment  Final    Comment: (NOTE) No Salmonella or Shigella recovered. Performed At: [facility] [address] [place], [place] [id number] Rush Farmer MD [id number]   STOOL CULTURE Reflex - CMPCXR     Status: None   Collection Time: 02/27/18 11:43 AM  Result Value Ref Range Status   Stool Culture result 1 (CMPCXR) Comment  Final    Comment: (NOTE) No Campylobacter species isolated. Performed At: [facility] [place], [place] [id number] Rush Farmer MD [id number]   C Difficile Quick Screen w PCR reflex     Status: Abnormal   Collection Time: 02/27/18 11:46 AM  Result Value Ref Range Status   C Diff antigen POSITIVE (A) NEGATIVE Final   C Diff toxin NEGATIVE NEGATIVE Final   C Diff interpretation Results are indeterminate. See PCR results.  Final    Comment: Performed at [facility], [address] [place], [place] [address]  C. Diff by PCR, Reflexed     Status: Abnormal   Collection Time: 02/27/18 11:46 AM  Result Value Ref Range Status   Toxigenic C. Difficile by  PCR POSITIVE (A) NEGATIVE Final    Comment: Positive for toxigenic C. difficile with little to no toxin production. Only treat if clinical presentation suggests symptomatic illness. Performed at [facility], [place] [address], [place],  [address]   Urine culture     Status:  Abnormal   Collection Time: 03/02/18 11:31 PM  Result Value Ref Range Status   Specimen Description   Final    URINE, RANDOM Performed at [place] [address], [place], [place] [address]    Special Requests   Final    NONE Performed at [facility], [place] [address], [place], [place] [address]    Culture >=100,000 COLONIES/mL ESCHERICHIA COLI (A)  Final   Report Status 03/05/2018 FINAL  Final   Organism ID, Bacteria ESCHERICHIA COLI (A)  Final      Susceptibility   Escherichia coli - MIC*    AMPICILLIN >=32 RESISTANT Resistant     CEFAZOLIN 8 SENSITIVE Sensitive     CEFTRIAXONE <=1 SENSITIVE Sensitive     CIPROFLOXACIN >=4 RESISTANT Resistant     GENTAMICIN <=1 SENSITIVE Sensitive     IMIPENEM <=0.25 SENSITIVE Sensitive     NITROFURANTOIN 32 SENSITIVE Sensitive     TRIMETH/SULFA <=20 SENSITIVE Sensitive     AMPICILLIN/SULBACTAM >=32 RESISTANT Resistant     PIP/TAZO <=4 SENSITIVE Sensitive     Extended ESBL NEGATIVE Sensitive     * >=100,000 COLONIES/mL ESCHERICHIA COLI     Labs: BNP (last 3 results) No results for input(s): BNP in the last 8760 hours. Basic Metabolic Panel: Recent Labs  Lab 02/27/18 0819 03/02/18 2234 03/03/18 0445 03/04/18 0536 03/05/18 0533  NA 139 129* 132* 140 140  K 4.3 3.9 3.4* 4.5 3.8  CL 98 88* 95* 103 105  CO2 29 26 26 29 24   GLUCOSE 165* 152* 164* 127* 136*  BUN 17 18 14 16 14   CREATININE 0.81 0.79 0.81 0.74 0.74  CALCIUM 10.1 9.5 8.9 9.2 9.0  MG  --   --   --  1.6* 1.7   Liver Function Tests: Recent Labs  Lab 02/26/18 1422 03/02/18 2234 03/04/18 0536  AST 16 18 13*  ALT 12 13 12   ALKPHOS 84 77 58   BILITOT 0.9 0.9 0.6  PROT 7.3 7.5 6.0*  ALBUMIN 3.9 4.3 3.4*   Recent Labs  Lab 03/02/18 2234  LIPASE 33   No results for input(s): AMMONIA in the last 168 hours. CBC: Recent Labs  Lab 02/26/18 1422 02/27/18 0819 03/02/18 2234 03/04/18 0536  WBC 8.8 8.0 9.5 6.9  NEUTROABS 5.4 6.0 7.0 3.7  HGB 12.4 13.0 13.1 11.8*  HCT 38.8 39.3 37.7 35.6*  MCV 87.2 86.8 81.4 85.6  PLT 297 279 325 278   Cardiac Enzymes: No results for input(s): CKTOTAL, CKMB, CKMBINDEX, TROPONINI in the last 168 hours. BNP: Invalid input(s): POCBNP CBG: Recent Labs  Lab 03/04/18 0754 03/04/18 1200 03/04/18 1648 03/04/18 2145 03/05/18 0745  GLUCAP 117* 119* 143* 112* 119*   D-Dimer No results for input(s): DDIMER in the last 72 hours. Hgb A1c No results for input(s): HGBA1C in the last 72 hours. Lipid Profile No results for input(s): CHOL, HDL, LDLCALC, TRIG, CHOLHDL, LDLDIRECT in the last 72 hours. Thyroid function studies No results for input(s): TSH, T4TOTAL, T3FREE, THYROIDAB in the last 72 hours.  Invalid input(s): FREET3 Anemia work up No results for input(s): VITAMINB12, FOLATE, FERRITIN, TIBC, IRON, RETICCTPCT in the last 72 hours. Urinalysis    Component Value Date/Time   COLORURINE YELLOW 03/02/2018 2331   APPEARANCEUR CLOUDY (A) 03/02/2018 2331   LABSPEC 1.005 03/02/2018 2331   PHURINE 9.0 (H) 03/02/2018 2331   GLUCOSEU NEGATIVE 03/02/2018 2331   HGBUR SMALL (A) 03/02/2018 2331   BILIRUBINUR NEGATIVE 03/02/2018 2331  KETONESUR NEGATIVE 03/02/2018 2331   PROTEINUR NEGATIVE 03/02/2018 2331   UROBILINOGEN 0.2 12/15/2014 1956   NITRITE NEGATIVE 03/02/2018 2331   LEUKOCYTESUR LARGE (A) 03/02/2018 2331   Sepsis Labs Invalid input(s): PROCALCITONIN,  WBC,  LACTICIDVEN Microbiology Recent Results (from the past 240 hour(s))  Stool culture (children & immunocomp patients)     Status: None   Collection Time: 02/27/18 11:43 AM  Result Value Ref Range Status    Salmonella/Shigella Screen Final report  Final   Campylobacter Culture Final report  Final   E coli, Shiga toxin Assay Negative Negative Final    Comment: (NOTE) Performed At: [facility] [address] [place], [place] [id number] Rush Farmer MD [id number]   OVA + PARASITE EXAM     Status: None   Collection Time: 02/27/18 11:43 AM  Result Value Ref Range Status   OVA + PARASITE EXAM Final report  Final    Comment: (NOTE) These results were obtained using wet preparation(s) and trichrome stained smear. This test does not include testing for Cryptosporidium parvum, Cyclospora, or Microsporidia. Performed At: [facility] [place], [place] [id number] Rush Farmer MD [id number]    Source of Sample STOOL  Final    Comment: Performed at [facility], [address] [place], [place] [address]  STOOL CULTURE REFLEX - RSASHR     Status: None   Collection Time: 02/27/18 11:43 AM  Result Value Ref Range Status   Stool Culture result 1 (RSASHR) Comment  Final    Comment: (NOTE) No Salmonella or Shigella recovered. Performed At: [facility] [address] [place], [place] [id number] Rush Farmer MD [id number]   STOOL CULTURE Reflex - CMPCXR     Status: None   Collection Time: 02/27/18 11:43 AM  Result Value Ref Range Status   Stool Culture result 1 (CMPCXR) Comment  Final    Comment: (NOTE) No Campylobacter species isolated. Performed At: [facility] [place], [place] [id number] Rush Farmer MD [id number]   C Difficile Quick Screen w PCR reflex     Status: Abnormal   Collection Time: 02/27/18 11:46 AM  Result Value Ref Range Status   C Diff antigen POSITIVE (A) NEGATIVE Final   C Diff toxin NEGATIVE NEGATIVE Final   C Diff interpretation Results are indeterminate. See PCR results.  Final    Comment: Performed at [facility], [address], [place], [place] [address]  C. Diff by PCR, Reflexed      Status: Abnormal   Collection Time: 02/27/18 11:46 AM  Result Value Ref Range Status   Toxigenic C. Difficile by PCR POSITIVE (A) NEGATIVE Final    Comment: Positive for toxigenic C. difficile with little to no toxin production. Only treat if clinical presentation suggests symptomatic illness. Performed at [facility], [place] [address], [place], [place] [address]   Urine culture     Status: Abnormal   Collection Time: 03/02/18 11:31 PM  Result Value Ref Range Status   Specimen Description   Final    URINE, RANDOM Performed at [place] [address], [place], [place] [address]    Special Requests   Final    NONE Performed at [facility], [place] [address], [place],  [address]    Culture >=100,000 COLONIES/mL ESCHERICHIA COLI (A)  Final   Report Status 03/05/2018 FINAL  Final   Organism ID, Bacteria ESCHERICHIA COLI (A)  Final      Susceptibility   Escherichia  coli - MIC*    AMPICILLIN >=32 RESISTANT Resistant     CEFAZOLIN 8 SENSITIVE Sensitive     CEFTRIAXONE <=1 SENSITIVE Sensitive     CIPROFLOXACIN >=4 RESISTANT Resistant     GENTAMICIN <=1 SENSITIVE Sensitive     IMIPENEM <=0.25 SENSITIVE Sensitive     NITROFURANTOIN 32 SENSITIVE Sensitive     TRIMETH/SULFA <=20 SENSITIVE Sensitive     AMPICILLIN/SULBACTAM >=32 RESISTANT Resistant     PIP/TAZO <=4 SENSITIVE Sensitive     Extended ESBL NEGATIVE Sensitive     * >=100,000 COLONIES/mL ESCHERICHIA COLI     Time coordinating discharge in minutes: 60  SIGNED:   Debbe Odea, MD  [facility] 03/05/2018, 10:45 AM Pager   If 7PM-7AM, please contact night-coverage [email or web address] Password [email or web address]

## 2018-03-06 ENCOUNTER — Other Ambulatory Visit: Payer: Self-pay | Admitting: Family Medicine

## 2018-03-06 MED ORDER — ONDANSETRON 8 MG PO TBDP: ORAL_TABLET | ORAL | 3 refills | Status: DC

## 2018-03-06 NOTE — Telephone Encounter (Signed)
Pt verbalized understanding. Pt requesting zofran odt for nausea. She is having nausea from taking the vancomycin qid. None this morning but would like to have some just in case it comes back. [facility] [place].

## 2018-03-06 NOTE — Telephone Encounter (Signed)
Medication sent to pharmacy and patient is aware  

## 2018-03-06 NOTE — Telephone Encounter (Signed)
Zofran 8 mg ODT, #15, 1 3 times daily as needed, 3 refill

## 2018-03-07 DIAGNOSIS — Z742 Need for assistance at home and no other household member able to render care: Secondary | ICD-10-CM | POA: Diagnosis not present

## 2018-03-07 DIAGNOSIS — A0472 Enterocolitis due to Clostridium difficile, not specified as recurrent: Secondary | ICD-10-CM | POA: Diagnosis not present

## 2018-03-07 DIAGNOSIS — Z7984 Long term (current) use of oral hypoglycemic drugs: Secondary | ICD-10-CM | POA: Diagnosis not present

## 2018-03-07 DIAGNOSIS — I10 Essential (primary) hypertension: Secondary | ICD-10-CM | POA: Diagnosis not present

## 2018-03-07 DIAGNOSIS — E785 Hyperlipidemia, unspecified: Secondary | ICD-10-CM | POA: Diagnosis not present

## 2018-03-07 DIAGNOSIS — E119 Type 2 diabetes mellitus without complications: Secondary | ICD-10-CM | POA: Diagnosis not present

## 2018-03-07 DIAGNOSIS — Z87440 Personal history of urinary (tract) infections: Secondary | ICD-10-CM | POA: Diagnosis not present

## 2018-03-08 ENCOUNTER — Emergency Department (HOSPITAL_COMMUNITY): Payer: Medicare Other

## 2018-03-08 ENCOUNTER — Other Ambulatory Visit: Payer: Self-pay

## 2018-03-08 ENCOUNTER — Inpatient Hospital Stay (HOSPITAL_COMMUNITY)
Admission: EM | Admit: 2018-03-08 | Discharge: 2018-03-12 | DRG: 372 | Disposition: A | Discharge status: Skilled Nursing Facility | Payer: Medicare Other | Attending: Internal Medicine | Admitting: Internal Medicine

## 2018-03-08 ENCOUNTER — Encounter (HOSPITAL_COMMUNITY): Payer: Self-pay | Admitting: Emergency Medicine

## 2018-03-08 DIAGNOSIS — E871 Hypo-osmolality and hyponatremia: Secondary | ICD-10-CM | POA: Diagnosis present

## 2018-03-08 DIAGNOSIS — E860 Dehydration: Secondary | ICD-10-CM | POA: Diagnosis present

## 2018-03-08 DIAGNOSIS — I10 Essential (primary) hypertension: Secondary | ICD-10-CM | POA: Diagnosis not present

## 2018-03-08 DIAGNOSIS — Z888 Allergy status to other drugs, medicaments and biological substances status: Secondary | ICD-10-CM

## 2018-03-08 DIAGNOSIS — Z7984 Long term (current) use of oral hypoglycemic drugs: Secondary | ICD-10-CM

## 2018-03-08 DIAGNOSIS — R197 Diarrhea, unspecified: Secondary | ICD-10-CM

## 2018-03-08 DIAGNOSIS — Z23 Encounter for immunization: Secondary | ICD-10-CM

## 2018-03-08 DIAGNOSIS — E785 Hyperlipidemia, unspecified: Secondary | ICD-10-CM | POA: Diagnosis present

## 2018-03-08 DIAGNOSIS — R110 Nausea: Secondary | ICD-10-CM

## 2018-03-08 DIAGNOSIS — E119 Type 2 diabetes mellitus without complications: Secondary | ICD-10-CM

## 2018-03-08 DIAGNOSIS — R918 Other nonspecific abnormal finding of lung field: Secondary | ICD-10-CM | POA: Diagnosis not present

## 2018-03-08 DIAGNOSIS — I700 Atherosclerosis of aorta: Secondary | ICD-10-CM

## 2018-03-08 DIAGNOSIS — A0472 Enterocolitis due to Clostridium difficile, not specified as recurrent: Secondary | ICD-10-CM | POA: Diagnosis not present

## 2018-03-08 DIAGNOSIS — Z66 Do not resuscitate: Secondary | ICD-10-CM | POA: Diagnosis present

## 2018-03-08 DIAGNOSIS — E872 Acidosis: Secondary | ICD-10-CM | POA: Diagnosis present

## 2018-03-08 DIAGNOSIS — Z79899 Other long term (current) drug therapy: Secondary | ICD-10-CM

## 2018-03-08 DIAGNOSIS — K8020 Calculus of gallbladder without cholecystitis without obstruction: Secondary | ICD-10-CM | POA: Diagnosis not present

## 2018-03-08 DIAGNOSIS — R1110 Vomiting, unspecified: Secondary | ICD-10-CM | POA: Diagnosis present

## 2018-03-08 DIAGNOSIS — E1151 Type 2 diabetes mellitus with diabetic peripheral angiopathy without gangrene: Secondary | ICD-10-CM

## 2018-03-08 HISTORY — DX: Other specified postprocedural states: Z98.890

## 2018-03-08 LAB — URINALYSIS, ROUTINE W REFLEX MICROSCOPIC
BILIRUBIN URINE: NEGATIVE
Glucose, UA: NEGATIVE mg/dL
HGB URINE DIPSTICK: NEGATIVE
Ketones, ur: NEGATIVE mg/dL
Leukocytes, UA: NEGATIVE
NITRITE: NEGATIVE
PROTEIN: NEGATIVE mg/dL
Specific Gravity, Urine: 1.006 (ref 1.005–1.030)
pH: 7 (ref 5.0–8.0)

## 2018-03-08 LAB — CBC WITH DIFFERENTIAL/PLATELET
Basophils Absolute: 0 10*3/uL (ref 0.0–0.1)
Basophils Relative: 0 %
Eosinophils Absolute: 0 10*3/uL (ref 0.0–0.7)
Eosinophils Relative: 1 %
HCT: 36.1 % (ref 36.0–46.0)
Hemoglobin: 12.2 g/dL (ref 12.0–15.0)
LYMPHS PCT: 18 %
Lymphs Abs: 1.3 10*3/uL (ref 0.7–4.0)
MCH: 28.2 pg (ref 26.0–34.0)
MCHC: 33.8 g/dL (ref 30.0–36.0)
MCV: 83.6 fL (ref 78.0–100.0)
MONOS PCT: 8 %
Monocytes Absolute: 0.6 10*3/uL (ref 0.1–1.0)
Neutro Abs: 5.3 10*3/uL (ref 1.7–7.7)
Neutrophils Relative %: 73 %
Platelets: 296 10*3/uL (ref 150–400)
RBC: 4.32 MIL/uL (ref 3.87–5.11)
RDW: 13.6 % (ref 11.5–15.5)
WBC: 7.3 10*3/uL (ref 4.0–10.5)

## 2018-03-08 LAB — COMPREHENSIVE METABOLIC PANEL
ALT: 15 U/L (ref 0–44)
ANION GAP: 13 (ref 5–15)
AST: 22 U/L (ref 15–41)
Albumin: 4 g/dL (ref 3.5–5.0)
Alkaline Phosphatase: 60 U/L (ref 38–126)
BUN: 14 mg/dL (ref 8–23)
CO2: 25 mmol/L (ref 22–32)
Calcium: 9.5 mg/dL (ref 8.9–10.3)
Chloride: 90 mmol/L — ABNORMAL LOW (ref 98–111)
Creatinine, Ser: 0.81 mg/dL (ref 0.44–1.00)
Glucose, Bld: 219 mg/dL — ABNORMAL HIGH (ref 70–99)
Potassium: 3.5 mmol/L (ref 3.5–5.1)
SODIUM: 128 mmol/L — AB (ref 135–145)
Total Bilirubin: 1.1 mg/dL (ref 0.3–1.2)
Total Protein: 6.9 g/dL (ref 6.5–8.1)

## 2018-03-08 LAB — LACTIC ACID, PLASMA
LACTIC ACID, VENOUS: 3.1 mmol/L — AB (ref 0.5–1.9)
Lactic Acid, Venous: 2.7 mmol/L (ref 0.5–1.9)

## 2018-03-08 LAB — TROPONIN I: Troponin I: <0.03 ng/mL (ref <0.03)

## 2018-03-08 LAB — GLUCOSE, CAPILLARY: Glucose-Capillary: 111 mg/dL — ABNORMAL HIGH (ref 70–99)

## 2018-03-08 LAB — PROTIME-INR
INR: 1
Prothrombin Time: 13.1 seconds (ref 11.4–15.2)

## 2018-03-08 LAB — LIPASE, BLOOD: Lipase: 32 U/L (ref 11–51)

## 2018-03-08 MED ORDER — ACETAMINOPHEN 650 MG RE SUPP: 650.0000 mg | Freq: Four times a day (QID) | RECTAL | Status: DC | PRN

## 2018-03-08 MED ORDER — ENOXAPARIN SODIUM 40 MG/0.4ML ~~LOC~~ SOLN
40.0000 mg | SUBCUTANEOUS | Status: DC
  Administered 2018-03-08 – 2018-03-11 (×4): 40 mg via SUBCUTANEOUS
  Filled 2018-03-08 (×4): qty 0.4

## 2018-03-08 MED ORDER — ACETAMINOPHEN 325 MG PO TABS: 650.0000 mg | ORAL_TABLET | Freq: Four times a day (QID) | ORAL | Status: DC | PRN

## 2018-03-08 MED ORDER — VANCOMYCIN 50 MG/ML ORAL SOLUTION
125.0000 mg | Freq: Four times a day (QID) | ORAL | Status: DC
  Administered 2018-03-08 – 2018-03-12 (×16): 125 mg via ORAL
  Filled 2018-03-08 (×22): qty 2.5

## 2018-03-08 MED ORDER — LOSARTAN POTASSIUM 50 MG PO TABS
100.0000 mg | ORAL_TABLET | Freq: Every day | ORAL | Status: DC
  Administered 2018-03-09 – 2018-03-12 (×4): 100 mg via ORAL
  Filled 2018-03-08 (×4): qty 2

## 2018-03-08 MED ORDER — ONDANSETRON HCL 4 MG PO TABS: 4.0000 mg | ORAL_TABLET | Freq: Four times a day (QID) | ORAL | Status: DC | PRN

## 2018-03-08 MED ORDER — ONDANSETRON HCL 4 MG/2ML IJ SOLN: 4.0000 mg | Freq: Four times a day (QID) | INTRAMUSCULAR | Status: DC | PRN

## 2018-03-08 MED ORDER — HYDRALAZINE HCL 10 MG PO TABS
10.0000 mg | ORAL_TABLET | Freq: Three times a day (TID) | ORAL | Status: DC
  Administered 2018-03-08 – 2018-03-12 (×12): 10 mg via ORAL
  Filled 2018-03-08 (×12): qty 1

## 2018-03-08 MED ORDER — INFLUENZA VAC SPLIT HIGH-DOSE 0.5 ML IM SUSY
0.5000 mL | PREFILLED_SYRINGE | INTRAMUSCULAR | Status: AC
  Administered 2018-03-09: 0.5 mL via INTRAMUSCULAR
  Filled 2018-03-08: qty 0.5

## 2018-03-08 MED ORDER — DIPHENHYDRAMINE HCL 25 MG PO CAPS
25.0000 mg | ORAL_CAPSULE | Freq: Every evening | ORAL | Status: DC | PRN
  Administered 2018-03-08 – 2018-03-09 (×2): 25 mg via ORAL
  Filled 2018-03-08 (×2): qty 1

## 2018-03-08 MED ORDER — SODIUM CHLORIDE 0.9 % IV BOLUS
500.0000 mL | Freq: Once | INTRAVENOUS | Status: AC
  Administered 2018-03-08: 500 mL via INTRAVENOUS

## 2018-03-08 MED ORDER — AMLODIPINE BESYLATE 5 MG PO TABS
10.0000 mg | ORAL_TABLET | Freq: Every day | ORAL | Status: DC
  Administered 2018-03-09 – 2018-03-12 (×4): 10 mg via ORAL
  Filled 2018-03-08 (×4): qty 2

## 2018-03-08 MED ORDER — SODIUM CHLORIDE 0.9 % IV SOLN
INTRAVENOUS | Status: DC
  Administered 2018-03-08: 11:00:00 via INTRAVENOUS

## 2018-03-08 MED ORDER — IOPAMIDOL (ISOVUE-300) INJECTION 61%
100.0000 mL | Freq: Once | INTRAVENOUS | Status: AC | PRN
  Administered 2018-03-08: 100 mL via INTRAVENOUS

## 2018-03-08 MED ORDER — ONDANSETRON HCL 4 MG/2ML IJ SOLN: 4.0000 mg | INTRAMUSCULAR | Status: DC | PRN

## 2018-03-08 NOTE — ED Notes (Signed)
CRITICAL VALUE ALERT  Critical Value:  Lactic acid 3.1  Date & Time Notied:  03/08/18  0950  Provider Notified: Thurnell Garbe  Orders Received/Actions taken:

## 2018-03-08 NOTE — ED Provider Notes (Signed)
[facility] EMERGENCY DEPARTMENT Provider Note   CSN: [id number] Arrival date & time: 03/08/18  0825     History   Chief Complaint Chief Complaint  Patient presents with  . Abdominal Pain    HPI [name] is a 84 y.o. female.  HPI  Pt was seen at 0855. Per pt, c/o gradual onset and worsening of persistent generalized weakness/fatigue for the past 2 days. Pt states she was discharged from the hospital 3 days ago for dx cdiff diarrhea, rx vancomycin. Pt states since being home, she has been nauseated, had poor PO intake, and increase in stooling and abd pain. Pt describes the stools as "dark," "black," and "mushy." Pt states she called her PMD and was told to come to the ED "so you all can admit me to the [facility]" because she "can't manage at home" by herself.  Denies vomiting, no CP/SOB, no cough, no back pain, no fevers, no focal motor weakness.   Past Medical History:  Diagnosis Date  . Diabetes mellitus without complication ([place])   . Diverticulitis   . H/O bilateral breast reduction surgery   . Hyperlipidemia    a. intolerant to statins.   . Hypertension     Patient Active Problem List   Diagnosis Date Noted  . Vomiting and diarrhea 03/08/2018  . C. difficile diarrhea 03/03/2018  . Acute lower UTI 03/03/2018  . Hyperlipidemia associated with type 2 diabetes mellitus ([place]) 10/02/2016  . Pedal edema 10/02/2016  . Insomnia 10/02/2016  . SBO (small bowel obstruction) ([place]) 07/02/2016  . Dyspnea on exertion 02/24/2016  . Other fatigue   . E-coli UTI 06/18/2015  . Partial small bowel obstruction ([place]) 06/15/2015  . Ileitis, regional, with intestinal obstruction 06/15/2015  . Leukocytosis 12/16/2014  . Nausea and vomiting 12/15/2014  . HLD (hyperlipidemia) 12/15/2014  . Essential hypertension 12/15/2014  . Controlled type 2 diabetes mellitus without complication, without long-term current use of insulin ([place]) 02/15/2011    Past Surgical History:  Procedure  Laterality Date  . ABDOMINAL HYSTERECTOMY    . APPENDECTOMY    . BREAST REDUCTION SURGERY    . COLON SURGERY Left    Hemicolectomy due to diverticulitis  . EYE SURGERY  03/30/2009   cataract  . KNEE SURGERY    . LAPAROSCOPIC INCISIONAL / UMBILICAL / VENTRAL HERNIA REPAIR  02/23/2007   . REDUCTION MAMMAPLASTY Bilateral 2001  . REFRACTIVE SURGERY       OB History   None      Home Medications    Prior to Admission medications   Medication Sig Start Date End Date Taking? Authorizing Provider  amLODipine (NORVASC) 10 MG tablet Take one tablet daily Patient taking differently: Take 10 mg by mouth daily.  01/22/18  Yes Mikey Kirschner, MD  beta carotene w/minerals (OCUVITE) tablet Take 1 tablet by mouth daily.   Yes [provider]  Cyanocobalamin 2500 MCG TABS Take 2,500 mcg by mouth daily.    Yes [provider]  feeding supplement, GLUCERNA SHAKE, (GLUCERNA SHAKE) LIQD Take 237 mLs by mouth 3 (three) times daily between meals. 03/05/18  Yes Debbe Odea, MD  hydrALAZINE (APRESOLINE) 10 MG tablet Take 1 tablet (10 mg total) by mouth 2 (two) times daily. One bid Patient taking differently: Take 10 mg by mouth 3 (three) times daily.  10/20/17  Yes Luking, Elayne Snare, MD  indapamide (LOZOL) 2.5 MG tablet 1 qam Patient taking differently: Take 2.5 mg by mouth every morning.  01/16/18  Yes Kathyrn Drown, MD  losartan (COZAAR) 100 MG tablet TAKE 1 TABLET BY MOUTH ONCE DAILY 11/27/17  Yes Luking, Scott A, MD  metFORMIN (GLUCOPHAGE) 1000 MG tablet TAKE 1 TABLET BY MOUTH TWICE DAILY 09/22/17  Yes Kathyrn Drown, MD  ondansetron (ZOFRAN ODT) 8 MG disintegrating tablet Take one three times daily as needed for nausea 03/06/18  Yes Luking, Scott A, MD  polyvinyl alcohol (LIQUIFILM TEARS) 1.4 % ophthalmic solution Place 1 drop into both eyes 3 (three) times daily.    Yes [provider]  vancomycin (VANCOCIN) 50 mg/mL oral solution Take 2.5 mLs (125 mg total) by mouth 4 (four)  times daily. 03/05/18  Yes Debbe Odea, MD  acetaminophen (TYLENOL) 500 MG tablet Take 500-1,000 mg by mouth every 6 (six) hours as needed for mild pain or moderate pain.    [provider]    Family History Family History  Problem Relation Age of Onset  . Hypertension Father        kidney  . Stroke Father   . Cancer Father   . Cancer Mother        ovaian  . Diabetes Brother   . Hypertension Sister     Social History Social History   Tobacco Use  . Smoking status: Never Smoker  . Smokeless tobacco: Never Used  Substance Use Topics  . Alcohol use: No  . Drug use: No     Allergies   Phenergan [promethazine hcl]; Statins; Trazodone and nefazodone; and Prednisone   Review of Systems Review of Systems ROS: Statement: All systems negative except as marked or noted in the HPI; Constitutional: Negative for fever and chills. +generalized weakness/fatigue.; ; Eyes: Negative for eye pain, redness and discharge. ; ; ENMT: Negative for ear pain, hoarseness, nasal congestion, sinus pressure and sore throat. ; ; Cardiovascular: Negative for chest pain, palpitations, diaphoresis, dyspnea and peripheral edema. ; ; Respiratory: Negative for cough, wheezing and stridor. ; ; Gastrointestinal: +nausea, poor PO intake, abd pain, diarrhea, "dark" stools. Negative for vomiting, blood in stool, hematemesis, jaundice and rectal bleeding. . ; ; Genitourinary: Negative for dysuria, flank pain and hematuria. ; ; Musculoskeletal: Negative for back pain and neck pain. Negative for swelling and trauma.; ; Skin: Negative for pruritus, rash, abrasions, blisters, bruising and skin lesion.; ; Neuro: Negative for headache, lightheadedness and neck stiffness. Negative for altered level of consciousness, altered mental status, extremity weakness, paresthesias, involuntary movement, seizure and syncope.       Physical Exam Updated Vital Signs BP (!) 148/47   Pulse 68   Temp 97.8 F (36.6 C) (Oral)    Resp 19   SpO2 97%    BP (!) 148/47   Pulse 68   Temp 97.8 F (36.6 C) (Oral)   Resp 19   SpO2 97%   09:56 Orthostatic Vital Signs AH  Orthostatic Lying   BP- Lying: 137/53   Pulse- Lying: 76       Orthostatic Sitting  BP- Sitting: 135/61   Pulse- Sitting: 92       Orthostatic Standing at 0 minutes  BP- Standing at 0 minutes: 134/53   Pulse- Standing at 0 minutes: 100     Physical Exam 0900: Physical examination:  Nursing notes reviewed; Vital signs and O2 SAT reviewed;  Constitutional: Well developed, Well nourished, In no acute distress; Head:  Normocephalic, atraumatic; Eyes: EOMI, PERRL, No scleral icterus; ENMT: Mouth and pharynx normal, Mucous membranes dry; Neck: Supple, Full range of motion, No  lymphadenopathy; Cardiovascular: Regular rate and rhythm, No gallop; Respiratory: Breath sounds clear & equal bilaterally, No wheezes.  Speaking full sentences with ease, Normal respiratory effort/excursion; Chest: Nontender, Movement normal; Abdomen: Soft, +diffuse tenderness to palp. No rebound or guarding. Nondistended, Normal bowel sounds. Rectal exam performed w/permission of pt and ED RN chaperone present.  Anal tone normal.  Non-tender, minimal soft dark brown stool in rectal vault, heme neg.  No fissures, no external hemorrhoids, no palp masses.; Genitourinary: No CVA tenderness; Extremities: Peripheral pulses normal, No tenderness, No edema, No calf edema or asymmetry.; Neuro: AA&Ox3, Major CN grossly intact.  Speech clear. No gross focal motor or sensory deficits in extremities.; Skin: Color normal, Warm, Dry.   ED Treatments / Results  Labs (all labs ordered are listed, but only abnormal results are displayed)   EKG EKG Interpretation  Date/Time:  Sunday March 08 2018 09:23:58 EDT Ventricular Rate:  81 PR Interval:    QRS Duration: 84 QT Interval:  380 QTC Calculation: 442 R Axis:   49 Text Interpretation:  Age not entered, assumed to be  84 years old for  purpose of ECG interpretation Sinus rhythm When compared with ECG of 05/13/2017 No significant change was found Confirmed by Francine Graven [phone]) on 03/08/2018 9:38:07 AM   Radiology   Procedures Procedures (including critical care time)  Medications Ordered in ED Medications  ondansetron (ZOFRAN) injection 4 mg (has no administration in time range)  0.9 %  sodium chloride infusion ( Intravenous Bolus from Bag 03/08/18 1124)  iopamidol (ISOVUE-300) 61 % injection 100 mL (100 mLs Intravenous Contrast Given 03/08/18 1025)  sodium chloride 0.9 % bolus 500 mL (0 mLs Intravenous Stopped 03/08/18 1124)     Initial Impression / Assessment and Plan / ED Course  I have reviewed the triage vital signs and the nursing notes.  Pertinent labs & imaging results that were available during my care of the patient were reviewed by me and considered in my medical decision making (see chart for details).  MDM Reviewed: previous chart, nursing note and vitals Reviewed previous: labs and ECG Interpretation: labs, ECG, x-ray and CT scan Total time providing critical care: 30-74 minutes. This excludes time spent performing separately reportable procedures and services. Consults: admitting MD   CRITICAL CARE Performed by: Francine Graven Total critical care time: 35 minutes Critical care time was exclusive of separately billable procedures and treating other patients. Critical care was necessary to treat or prevent imminent or life-threatening deterioration. Critical care was time spent personally by me on the following activities: development of treatment plan with patient and/or surrogate as well as nursing, discussions with consultants, evaluation of patient's response to treatment, examination of patient, obtaining history from patient or surrogate, ordering and performing treatments and interventions, ordering and review of laboratory studies, ordering and review of radiographic studies, pulse oximetry  and re-evaluation of patient's condition.  Results for orders placed or performed during the hospital encounter of 03/08/18  CBC with Differential  Result Value Ref Range   WBC 7.3 4.0 - 10.5 K/uL   RBC 4.32 3.87 - 5.11 MIL/uL   Hemoglobin 12.2 12.0 - 15.0 g/dL   HCT 36.1 36.0 - 46.0 %   MCV 83.6 78.0 - 100.0 fL   MCH 28.2 26.0 - 34.0 pg   MCHC 33.8 30.0 - 36.0 g/dL   RDW 13.6 11.5 - 15.5 %   Platelets 296 150 - 400 K/uL   Neutrophils Relative % 73 %   Neutro Abs 5.3 1.7 -  7.7 K/uL   Lymphocytes Relative 18 %   Lymphs Abs 1.3 0.7 - 4.0 K/uL   Monocytes Relative 8 %   Monocytes Absolute 0.6 0.1 - 1.0 K/uL   Eosinophils Relative 1 %   Eosinophils Absolute 0.0 0.0 - 0.7 K/uL   Basophils Relative 0 %   Basophils Absolute 0.0 0.0 - 0.1 K/uL  Comprehensive metabolic panel  Result Value Ref Range   Sodium 128 (L) 135 - 145 mmol/L   Potassium 3.5 3.5 - 5.1 mmol/L   Chloride 90 (L) 98 - 111 mmol/L   CO2 25 22 - 32 mmol/L   Glucose, Bld 219 (H) 70 - 99 mg/dL   BUN 14 8 - 23 mg/dL   Creatinine, Ser 0.81 0.44 - 1.00 mg/dL   Calcium 9.5 8.9 - 10.3 mg/dL   Total Protein 6.9 6.5 - 8.1 g/dL   Albumin 4.0 3.5 - 5.0 g/dL   AST 22 15 - 41 U/L   ALT 15 0 - 44 U/L   Alkaline Phosphatase 60 38 - 126 U/L   Total Bilirubin 1.1 0.3 - 1.2 mg/dL   GFR calc non Af Amer >60 >60 mL/min   GFR calc Af Amer >60 >60 mL/min   Anion gap 13 5 - 15  Lipase, blood  Result Value Ref Range   Lipase 32 11 - 51 U/L  Troponin I  Result Value Ref Range   Troponin I <0.03 <0.03 ng/mL  Lactic acid, plasma  Result Value Ref Range   Lactic Acid, Venous 3.1 (HH) 0.5 - 1.9 mmol/L  Lactic acid, plasma  Result Value Ref Range   Lactic Acid, Venous 2.7 (HH) 0.5 - 1.9 mmol/L  Protime-INR  Result Value Ref Range   Prothrombin Time 13.1 11.4 - 15.2 seconds   INR 1.00   Urinalysis, Routine w reflex microscopic  Result Value Ref Range   Color, Urine YELLOW YELLOW   APPearance CLEAR CLEAR   Specific Gravity,  Urine 1.006 1.005 - 1.030   pH 7.0 5.0 - 8.0   Glucose, UA NEGATIVE NEGATIVE mg/dL   Hgb urine dipstick NEGATIVE NEGATIVE   Bilirubin Urine NEGATIVE NEGATIVE   Ketones, ur NEGATIVE NEGATIVE mg/dL   Protein, ur NEGATIVE NEGATIVE mg/dL   Nitrite NEGATIVE NEGATIVE   Leukocytes, UA NEGATIVE NEGATIVE   Dg Chest 2 View Result Date: 03/08/2018 CLINICAL DATA:  84 year old female with a history of weakness EXAM: CHEST - 2 VIEW COMPARISON:  01/27/2018, 05/13/2017 FINDINGS: Cardiomediastinal silhouette unchanged in size and contour. No pneumothorax. No pleural effusion. Coarsened interstitial markings, similar to the prior. No confluent airspace disease. No acute displaced fracture.  Degenerative changes of the spine. IMPRESSION: Chronic changes without evidence of acute cardiopulmonary disease Electronically Signed   By: Corrie Mckusick D.O.   On: 03/08/2018 10:50   Ct Abdomen Pelvis W Contrast Result Date: 03/08/2018 CLINICAL DATA:  Worsening abdominal pain and diarrhea for several days. C difficile colitis. EXAM: CT ABDOMEN AND PELVIS WITH CONTRAST TECHNIQUE: Multidetector CT imaging of the abdomen and pelvis was performed using the standard protocol following bolus administration of intravenous contrast. CONTRAST:  140mL ISOVUE-300 IOPAMIDOL (ISOVUE-300) INJECTION 61% COMPARISON:  02/27/2018 FINDINGS: Lower Chest: No acute findings. Hepatobiliary: No hepatic masses identified. Multiple small calcified gallstones are seen, however there is no evidence of cholecystitis or biliary ductal dilatation. Pancreas:  No mass or inflammatory changes. Spleen: Within normal limits in size and appearance. Adrenals/Urinary Tract: No masses identified. No evidence of hydronephrosis. Stomach/Bowel: No evidence of obstruction, inflammatory  process or abnormal fluid collections. Vascular/Lymphatic: No pathologically enlarged lymph nodes. No abdominal aortic aneurysm. Aortic atherosclerosis. Reproductive: Prior hysterectomy noted.  Adnexal regions are unremarkable in appearance. Other: Surgical mesh is seen within the anterior abdominal wall, without evidence of recurrent hernia. Musculoskeletal:  No suspicious bone lesions identified. IMPRESSION: No radiographic evidence of colitis or other acute findings. Cholelithiasis.  No radiographic evidence of cholecystitis. Electronically Signed   By: Earle Gell M.D.   On: 03/08/2018 11:37    0900:  Pt states her PMD sent her to the ED to "get admitted to the [facility]" because she lives alone and feels she cannot manage at home. Pt does endorse she has Home Health services, with last visit yesterday. I set pt expectations regarding getting transferred from the ED to SNF from the ED and that I would evaluate her for possible hospital admission. Pt verb understanding.   1220:  Pt not orthostatic on VS. New hyponatremia on labs today and lactic acid mildly elevated. Judicious IVF bolus and gtt started. Lactic acid improving. IV zofran given and pt was able to tol her PO vancomycin dose, though stated she still "didn't feel well." Dx and testing d/w pt.  Questions answered.  Verb understanding, agreeable to admit.  T/C returned from [facility]. Roderic Palau, case discussed, including:  HPI, pertinent PM/SHx, VS/PE, dx testing, ED course and treatment:  Agreeable to admit.        Final Clinical Impressions(s) / ED Diagnoses   Final diagnoses:  Nausea in adult patient  Diarrhea in adult patient  Hyponatremia    ED Discharge Orders    None       Francine Graven, DO 03/10/18 1009

## 2018-03-08 NOTE — H&P (Signed)
History and Physical    [name] JKD:[id number] DOB: [birth year] DOA: 03/08/2018  PCP: Kathyrn Drown, MD  Patient coming from: home  I have personally briefly reviewed patient's old medical records in [place]  Chief Complaint: Diarrhea  HPI: [name] is a 84 y.o. female with medical history significant of hypertension, diabetes, recent admission to [facility] long hospital for C difficile diarrhea, discharged home on oral vancomycin.  Patient was initially diagnosed with C. difficile as an outpatient and was started on Flagyl.  When her symptoms did not improve, she was admitted twice long hospital and transition oral vancomycin.  She reports at the time of discharge, she was doing well.  Her appetite was improving, she is not having any vomiting.  Overall stool frequency had improved.  Upon returning home, she reports gradually increased in the number of stools.  She is now up to 6-8 bowel movements a day.  This is described as mushy stools.  She has had nausea, but no vomiting.  P.o. intake has been poor.  She is feeling increasingly weak and dizzy on standing today.  She has not had any fever.  No dysuria.  ED Course: Noted to be mildly hyponatremic at 128.  Vitals were checked her blood pressure was stable.  She was mildly orthostatic on standing with increase of heart rate greater than 20 points.  Lactic acid elevated at 3.1.  Review of Systems: As per HPI otherwise 10 point review of systems negative.    Past Medical History:  Diagnosis Date  . Diabetes mellitus without complication ([place])   . Diverticulitis   . H/O bilateral breast reduction surgery   . Hyperlipidemia    a. intolerant to statins.   . Hypertension     Past Surgical History:  Procedure Laterality Date  . ABDOMINAL HYSTERECTOMY    . APPENDECTOMY    . BREAST REDUCTION SURGERY    . COLON SURGERY Left    Hemicolectomy due to diverticulitis  . EYE SURGERY  03/30/2009   cataract  . KNEE SURGERY     . LAPAROSCOPIC INCISIONAL / UMBILICAL / VENTRAL HERNIA REPAIR  02/23/2007   . REDUCTION MAMMAPLASTY Bilateral 2001  . REFRACTIVE SURGERY      Social History:  reports that she has never smoked. She has never used smokeless tobacco. She reports that she does not drink alcohol or use drugs.  Allergies  Allergen Reactions  . Phenergan [Promethazine Hcl] Other (See Comments)    hallucinations  . Statins Other (See Comments)    Severe myalgias  . Trazodone And Nefazodone Cough  . Prednisone Rash    Family History  Problem Relation Age of Onset  . Hypertension Father        kidney  . Stroke Father   . Cancer Father   . Cancer Mother        ovaian  . Diabetes Brother   . Hypertension Sister     Prior to Admission medications   Medication Sig Start Date End Date Taking? Authorizing Provider  amLODipine (NORVASC) 10 MG tablet Take one tablet daily Patient taking differently: Take 10 mg by mouth daily.  01/22/18  Yes Mikey Kirschner, MD  beta carotene w/minerals (OCUVITE) tablet Take 1 tablet by mouth daily.   Yes [provider]  Cyanocobalamin 2500 MCG TABS Take 2,500 mcg by mouth daily.    Yes [provider]  feeding supplement, GLUCERNA SHAKE, (GLUCERNA SHAKE) LIQD Take 237 mLs by mouth 3 (  three) times daily between meals. 03/05/18  Yes Debbe Odea, MD  hydrALAZINE (APRESOLINE) 10 MG tablet Take 1 tablet (10 mg total) by mouth 2 (two) times daily. One bid Patient taking differently: Take 10 mg by mouth 3 (three) times daily.  10/20/17  Yes Luking, Elayne Snare, MD  indapamide (LOZOL) 2.5 MG tablet 1 qam Patient taking differently: Take 2.5 mg by mouth every morning.  01/16/18  Yes Kathyrn Drown, MD  losartan (COZAAR) 100 MG tablet TAKE 1 TABLET BY MOUTH ONCE DAILY 11/27/17  Yes Luking, Scott A, MD  metFORMIN (GLUCOPHAGE) 1000 MG tablet TAKE 1 TABLET BY MOUTH TWICE DAILY 09/22/17  Yes Kathyrn Drown, MD  ondansetron (ZOFRAN ODT) 8 MG disintegrating tablet Take one  three times daily as needed for nausea 03/06/18  Yes Luking, Scott A, MD  polyvinyl alcohol (LIQUIFILM TEARS) 1.4 % ophthalmic solution Place 1 drop into both eyes 3 (three) times daily.    Yes [provider]  vancomycin (VANCOCIN) 50 mg/mL oral solution Take 2.5 mLs (125 mg total) by mouth 4 (four) times daily. 03/05/18  Yes Debbe Odea, MD  acetaminophen (TYLENOL) 500 MG tablet Take 500-1,000 mg by mouth every 6 (six) hours as needed for mild pain or moderate pain.    [provider]    Physical Exam: Vitals:   03/08/18 1200 03/08/18 1230 03/08/18 1300 03/08/18 1347  BP: (!) 127/47 (!) 135/55 (!) 127/54 (!) 137/41  Pulse: 67 66 60 61  Resp:    18  Temp:    98.4 F (36.9 C)  TempSrc:    Oral  SpO2: 96% 98% 98% 100%  Weight:    72.6 kg  Height:    5\' 8"  (1.727 m)    Constitutional: NAD, calm, comfortable Eyes: PERRL, lids and conjunctivae normal ENMT: Mucous membranes are dry. Posterior pharynx clear of any exudate or lesions.Normal dentition.  Neck: normal, supple, no masses, no thyromegaly Respiratory: clear to auscultation bilaterally, no wheezing, no crackles. Normal respiratory effort. No accessory muscle use.  Cardiovascular: Regular rate and rhythm, no murmurs / rubs / gallops. No extremity edema. 2+ pedal pulses. No carotid bruits.  Abdomen: Mild tenderness in the lower abdomen, no masses palpated. No hepatosplenomegaly. Bowel sounds positive.  Musculoskeletal: no clubbing / cyanosis. No joint deformity upper and lower extremities. Good ROM, no contractures. Normal muscle tone.  Skin: no rashes, lesions, ulcers. No induration Neurologic: CN 2-12 grossly intact. Sensation intact, DTR normal. Strength 5/5 in all 4.  Psychiatric: Normal judgment and insight. Alert and oriented x 3. Normal mood.    Labs on Admission: I have personally reviewed following labs and imaging studies  CBC: Recent Labs  Lab 03/02/18 2234 03/04/18 0536 03/08/18 0906  WBC 9.5 6.9  7.3  NEUTROABS 7.0 3.7 5.3  HGB 13.1 11.8* 12.2  HCT 37.7 35.6* 36.1  MCV 81.4 85.6 83.6  PLT 325 278 151   Basic Metabolic Panel: Recent Labs  Lab 03/02/18 2234 03/03/18 0445 03/04/18 0536 03/05/18 0533 03/08/18 0906  NA 129* 132* 140 140 128*  K 3.9 3.4* 4.5 3.8 3.5  CL 88* 95* 103 105 90*  CO2 26 26 29 24 25   GLUCOSE 152* 164* 127* 136* 219*  BUN 18 14 16 14 14   CREATININE 0.79 0.81 0.74 0.74 0.81  CALCIUM 9.5 8.9 9.2 9.0 9.5  MG  --   --  1.6* 1.7  --    GFR: Estimated Creatinine Clearance: 52.2 mL/min (by C-G formula based on SCr  of 0.81 mg/dL). Liver Function Tests: Recent Labs  Lab 03/02/18 2234 03/04/18 0536 03/08/18 0906  AST 18 13* 22  ALT 13 12 15   ALKPHOS 77 58 60  BILITOT 0.9 0.6 1.1  PROT 7.5 6.0* 6.9  ALBUMIN 4.3 3.4* 4.0   Recent Labs  Lab 03/02/18 2234 03/08/18 0906  LIPASE 33 32   No results for input(s): AMMONIA in the last 168 hours. Coagulation Profile: Recent Labs  Lab 03/08/18 0906  INR 1.00   Cardiac Enzymes: Recent Labs  Lab 03/08/18 0906  TROPONINI <0.03   BNP (last 3 results) No results for input(s): PROBNP in the last 8760 hours. HbA1C: No results for input(s): HGBA1C in the last 72 hours. CBG: Recent Labs  Lab 03/04/18 1200 03/04/18 1648 03/04/18 2145 03/05/18 0745 03/05/18 1215  GLUCAP 119* 143* 112* 119* 156*   Lipid Profile: No results for input(s): CHOL, HDL, LDLCALC, TRIG, CHOLHDL, LDLDIRECT in the last 72 hours. Thyroid Function Tests: No results for input(s): TSH, T4TOTAL, FREET4, T3FREE, THYROIDAB in the last 72 hours. Anemia Panel: No results for input(s): VITAMINB12, FOLATE, FERRITIN, TIBC, IRON, RETICCTPCT in the last 72 hours. Urine analysis:    Component Value Date/Time   COLORURINE YELLOW 03/08/2018 1009   APPEARANCEUR CLEAR 03/08/2018 1009   LABSPEC 1.006 03/08/2018 1009   PHURINE 7.0 03/08/2018 1009   GLUCOSEU NEGATIVE 03/08/2018 1009   HGBUR NEGATIVE 03/08/2018 1009   [place] 03/08/2018 1009   [place] 03/08/2018 1009   PROTEINUR NEGATIVE 03/08/2018 1009   UROBILINOGEN 0.2 12/15/2014 1956   NITRITE NEGATIVE 03/08/2018 1009   LEUKOCYTESUR NEGATIVE 03/08/2018 1009    Radiological Exams on Admission: Dg Chest 2 View  Result Date: 03/08/2018 CLINICAL DATA:  84 year old female with a history of weakness EXAM: CHEST - 2 VIEW COMPARISON:  01/27/2018, 05/13/2017 FINDINGS: Cardiomediastinal silhouette unchanged in size and contour. No pneumothorax. No pleural effusion. Coarsened interstitial markings, similar to the prior. No confluent airspace disease. No acute displaced fracture.  Degenerative changes of the spine. IMPRESSION: Chronic changes without evidence of acute cardiopulmonary disease Electronically Signed   By: Corrie Mckusick D.O.   On: 03/08/2018 10:50   Ct Abdomen Pelvis W Contrast  Result Date: 03/08/2018 CLINICAL DATA:  Worsening abdominal pain and diarrhea for several days. C difficile colitis. EXAM: CT ABDOMEN AND PELVIS WITH CONTRAST TECHNIQUE: Multidetector CT imaging of the abdomen and pelvis was performed using the standard protocol following bolus administration of intravenous contrast. CONTRAST:  181mL ISOVUE-300 IOPAMIDOL (ISOVUE-300) INJECTION 61% COMPARISON:  02/27/2018 FINDINGS: Lower Chest: No acute findings. Hepatobiliary: No hepatic masses identified. Multiple small calcified gallstones are seen, however there is no evidence of cholecystitis or biliary ductal dilatation. Pancreas:  No mass or inflammatory changes. Spleen: Within normal limits in size and appearance. Adrenals/Urinary Tract: No masses identified. No evidence of hydronephrosis. Stomach/Bowel: No evidence of obstruction, inflammatory process or abnormal fluid collections. Vascular/Lymphatic: No pathologically enlarged lymph nodes. No abdominal aortic aneurysm. Aortic atherosclerosis. Reproductive: Prior hysterectomy noted. Adnexal regions are unremarkable in appearance.  Other: Surgical mesh is seen within the anterior abdominal wall, without evidence of recurrent hernia. Musculoskeletal:  No suspicious bone lesions identified. IMPRESSION: No radiographic evidence of colitis or other acute findings. Cholelithiasis.  No radiographic evidence of cholecystitis. Electronically Signed   By: Earle Gell M.D.   On: 03/08/2018 11:37    EKG: Independently reviewed.  Sinus rhythm without any acute changes  Assessment/Plan Active Problems:   Controlled type 2 diabetes mellitus without complication, without  long-term current use of insulin (HCC)   HLD (hyperlipidemia)   Essential hypertension   C. difficile diarrhea   Vomiting and diarrhea   Dehydration   Hyponatremia     1. C. difficile diarrhea.  Continue on oral vancomycin.  Continue supportive measures. 2. Lactic acidosis.  Suspect is related to dehydration.  She is also on metformin.  We will continue with IV hydration. 3. Hyponatremia.  Related to volume depletion.  Continue IV saline. 4. Diabetes.  Hold oral agents.  Start on sliding scale insulin. 5. Hypertension.  Blood pressure currently stable.  Continue on amlodipine, losartan and hydralazine.  Hold indapamide since she is volume depleted. 6. Generalized weakness.  Will get physical therapy evaluation.  DVT prophylaxis: lovenox  Code Status: DNR  Family Communication:  No family present  Disposition Plan: patient is interested in placement  Consults called:   Admission status: observation, medsurg   Kathie Dike MD [facility] Pager [phone]  If 7PM-7AM, please contact night-coverage [email or web address] Password [facility]  03/08/2018, 5:32 PM

## 2018-03-08 NOTE — ED Triage Notes (Signed)
Pt dx with c diff one week ago. States soft mooshy stools has become more frequent the last 2 days. Called on call dr Malachy Moan number and was told to come here. Was taking flagyl but changed to vancomycin this past MOnday when went to [facility]. Denies diarrhea. States just soft and mooshy x 8 in past 24 hours. No vomiting. Denies blood in stool a/o. Able to get from w/c to stretcher. No obvious gen weakness noted. Lower abd pain.

## 2018-03-09 ENCOUNTER — Telehealth: Payer: Self-pay | Admitting: Family Medicine

## 2018-03-09 DIAGNOSIS — I10 Essential (primary) hypertension: Secondary | ICD-10-CM | POA: Diagnosis not present

## 2018-03-09 DIAGNOSIS — E860 Dehydration: Secondary | ICD-10-CM | POA: Diagnosis not present

## 2018-03-09 DIAGNOSIS — R197 Diarrhea, unspecified: Secondary | ICD-10-CM | POA: Diagnosis not present

## 2018-03-09 DIAGNOSIS — E871 Hypo-osmolality and hyponatremia: Secondary | ICD-10-CM | POA: Diagnosis not present

## 2018-03-09 DIAGNOSIS — A0472 Enterocolitis due to Clostridium difficile, not specified as recurrent: Secondary | ICD-10-CM | POA: Diagnosis not present

## 2018-03-09 DIAGNOSIS — E119 Type 2 diabetes mellitus without complications: Secondary | ICD-10-CM | POA: Diagnosis not present

## 2018-03-09 LAB — GLUCOSE, CAPILLARY
GLUCOSE-CAPILLARY: 102 mg/dL — AB (ref 70–99)
GLUCOSE-CAPILLARY: 105 mg/dL — AB (ref 70–99)
Glucose-Capillary: 117 mg/dL — ABNORMAL HIGH (ref 70–99)
Glucose-Capillary: 107 mg/dL — ABNORMAL HIGH (ref 70–99)

## 2018-03-09 LAB — COMPREHENSIVE METABOLIC PANEL
ALK PHOS: 51 U/L (ref 38–126)
ALT: 10 U/L (ref 0–44)
ANION GAP: 7 (ref 5–15)
AST: 13 U/L — ABNORMAL LOW (ref 15–41)
Albumin: 3.4 g/dL — ABNORMAL LOW (ref 3.5–5.0)
BILIRUBIN TOTAL: 0.9 mg/dL (ref 0.3–1.2)
BUN: 10 mg/dL (ref 8–23)
CO2: 28 mmol/L (ref 22–32)
Calcium: 8.9 mg/dL (ref 8.9–10.3)
Chloride: 99 mmol/L (ref 98–111)
Creatinine, Ser: 0.71 mg/dL (ref 0.44–1.00)
GFR calc Af Amer: >60 mL/min (ref >60)
Glucose, Bld: 115 mg/dL — ABNORMAL HIGH (ref 70–99)
Potassium: 4.2 mmol/L (ref 3.5–5.1)
SODIUM: 134 mmol/L — AB (ref 135–145)
Total Protein: 6.1 g/dL — ABNORMAL LOW (ref 6.5–8.1)

## 2018-03-09 LAB — CBC
HCT: 35 % — ABNORMAL LOW (ref 36.0–46.0)
Hemoglobin: 11.3 g/dL — ABNORMAL LOW (ref 12.0–15.0)
MCH: 27.7 pg (ref 26.0–34.0)
MCHC: 32.3 g/dL (ref 30.0–36.0)
MCV: 85.8 fL (ref 78.0–100.0)
Platelets: 285 10*3/uL (ref 150–400)
RBC: 4.08 MIL/uL (ref 3.87–5.11)
RDW: 14 % (ref 11.5–15.5)
WBC: 6.6 10*3/uL (ref 4.0–10.5)

## 2018-03-09 LAB — URINE CULTURE: Culture: <10000 — AB

## 2018-03-09 NOTE — NC FL2 (Signed)
[place] LEVEL OF CARE SCREENING TOOL     IDENTIFICATION  Patient Name: [name] Birthdate: July 22, 1931 Sex: female Admission Date (Current Location): 03/08/2018  [facility] and [place] Number:  [employer] and Address:  [place] [address] [place]      Provider Number: [id number]  Attending Physician Name and Address:  Kathie Dike, MD  Relative Name and Phone Number:       Current Level of Care: Other (Comment)(OBSERVATION) Recommended Level of Care: Nursing Facility Prior Approval Number:    Date Approved/Denied:   PASRR Number: [id number])  Discharge Plan: SNF    Current Diagnoses: Patient Active Problem List   Diagnosis Date Noted  . Vomiting and diarrhea 03/08/2018  . Dehydration 03/08/2018  . Hyponatremia 03/08/2018  . C. difficile diarrhea 03/03/2018  . Acute lower UTI 03/03/2018  . Hyperlipidemia associated with type 2 diabetes mellitus ([place]) 10/02/2016  . Pedal edema 10/02/2016  . Insomnia 10/02/2016  . SBO (small bowel obstruction) ([place]) 07/02/2016  . Dyspnea on exertion 02/24/2016  . Other fatigue   . E-coli UTI 06/18/2015  . Partial small bowel obstruction ([place]) 06/15/2015  . Ileitis, regional, with intestinal obstruction 06/15/2015  . Leukocytosis 12/16/2014  . Nausea and vomiting 12/15/2014  . HLD (hyperlipidemia) 12/15/2014  . Essential hypertension 12/15/2014  . Controlled type 2 diabetes mellitus without complication, without long-term current use of insulin ([place]) 02/15/2011    Orientation RESPIRATION BLADDER Height & Weight     Self, Time, Situation, Place  Normal Continent Weight: 160 lb (72.6 kg) Height:  5\' 8"  (172.7 cm)  BEHAVIORAL SYMPTOMS/MOOD NEUROLOGICAL BOWEL NUTRITION STATUS      Continent (Soft )  AMBULATORY STATUS COMMUNICATION OF NEEDS Skin   Limited Assist Verbally Normal                       Personal Care Assistance Level of Assistance   Feeding, Dressing, Bathing Bathing Assistance: Limited assistance Feeding assistance: Independent Dressing Assistance: Limited assistance     Functional Limitations Info  Sight, Hearing, Speech Sight Info: Adequate Hearing Info: Adequate Speech Info: Adequate    SPECIAL CARE FACTORS FREQUENCY  PT (By licensed PT)     PT Frequency: 5x/week              Contractures Contractures Info: Not present    Additional Factors Info  Code Status, Allergies, Isolation Precautions Code Status Info: DNR Allergies Info: Phenergan, Statins, Trazodone, and Nefazodone, Prednisone      Isolation Precautions Info: Enteric precautions      Current Medications (03/09/2018):  This is the current hospital active medication list Current Facility-Administered Medications  Medication Dose Route Frequency Provider Last Rate Last Dose  . 0.9 %  sodium chloride infusion   Intravenous Continuous Kathie Dike, MD 100 mL/hr at 03/08/18 1124    . acetaminophen (TYLENOL) tablet 650 mg  650 mg Oral Q6H PRN Kathie Dike, MD       Or  . acetaminophen (TYLENOL) suppository 650 mg  650 mg Rectal Q6H PRN Kathie Dike, MD      . amLODipine (NORVASC) tablet 10 mg  10 mg Oral Daily Kathie Dike, MD   10 mg at 03/09/18 1026  . diphenhydrAMINE (BENADRYL) capsule 25 mg  25 mg Oral QHS PRN Kathie Dike, MD   25 mg at 03/08/18 2127  . enoxaparin (LOVENOX) injection 40 mg  40 mg Subcutaneous Q24H Kathie Dike, MD   40  mg at 03/08/18 1827  . hydrALAZINE (APRESOLINE) tablet 10 mg  10 mg Oral TID Kathie Dike, MD   10 mg at 03/09/18 1528  . losartan (COZAAR) tablet 100 mg  100 mg Oral Daily Kathie Dike, MD   100 mg at 03/09/18 1028  . ondansetron (ZOFRAN) tablet 4 mg  4 mg Oral Q6H PRN Kathie Dike, MD       Or  . ondansetron (ZOFRAN) injection 4 mg  4 mg Intravenous Q6H PRN Kathie Dike, MD      . vancomycin (VANCOCIN) 50 mg/mL oral solution 125 mg  125 mg Oral QID Kathie Dike, MD   125  mg at 03/09/18 1424     Discharge Medications: Please see discharge summary for a list of discharge medications.  Relevant Imaging Results:  Relevant Lab Results:   Additional Information SSN: [id number].  Patient is in observation and will be private pay.   Rudra Hobbins, Clydene Pugh, LCSW

## 2018-03-09 NOTE — Progress Notes (Signed)
PROGRESS NOTE    [name]  WVP:[id number] DOB: 1932-04-23 DOA: 03/08/2018 PCP: Kathyrn Drown, MD    Brief Narrative:  84 year old female with a history of hypertension, diabetes and recent admission for C. difficile diarrhea, returns to the hospital with worsening diarrhea, dehydration and hyponatremia.  She is continued on oral vancomycin and started on IV hydration.  We will continue supportive management and monitor for progression of stools.  Seen by physical therapy with recommendations for skilled nursing facility placement.   Assessment & Plan:   Active Problems:   Controlled type 2 diabetes mellitus without complication, without long-term current use of insulin (HCC)   HLD (hyperlipidemia)   Essential hypertension   C. difficile diarrhea   Vomiting and diarrhea   Dehydration   Hyponatremia   1. C. difficile diarrhea.  Continue oral vancomycin.  Stools appear to be slowly improving.  Continue current measures. 2. Lactic acidosis.  Trending down with IV fluids.  Will repeat in a.m. 3. Hyponatremia.  Related to volume depletion.  Improving with IV saline. 4. Diabetes.  Oral agents currently on hold.  On sliding scale insulin.  Blood sugars are stable. 5. Hypertension.  Blood pressure stable.  Continue on amlodipine, losartan and hydralazine. 6. Generalized weakness.  Seen by physical therapy with recommendations for skilled nursing facility placement.   DVT prophylaxis: Lovenox Code Status: DNR Family Communication: No family present Disposition Plan: Probable placement on discharge   Consultants:     Procedures:     Antimicrobials:   Oral vancomycin   Subjective: Had a total of 6 bowel movements yesterday.  Reports having to this morning.  Overall abdomen is less tender.  Objective: Vitals:   03/08/18 2032 03/08/18 2239 03/09/18 0621 03/09/18 1526  BP:  (!) 121/47 130/62 (!) 132/59  Pulse:  (!) 58 65 61  Resp:  18 18 18   Temp:  97.9 F (36.6  C) 97.8 F (36.6 C) 98 F (36.7 C)  TempSrc:  Oral Oral Oral  SpO2: 99% 99% 98% 100%  Weight:      Height:        Intake/Output Summary (Last 24 hours) at 03/09/2018 1844 Last data filed at 03/09/2018 1800 Gross per 24 hour  Intake 720 ml  Output -  Net 720 ml   Filed Weights   03/08/18 1347  Weight: 72.6 kg    Examination:  General exam: Appears calm and comfortable  Respiratory system: Clear to auscultation. Respiratory effort normal. Cardiovascular system: S1 & S2 heard, RRR. No JVD, murmurs, rubs, gallops or clicks. No pedal edema. Gastrointestinal system: Abdomen is nondistended, soft and nontender. No organomegaly or masses felt. Normal bowel sounds heard. Central nervous system: Alert and oriented. No focal neurological deficits. Extremities: Symmetric 5 x 5 power. Skin: No rashes, lesions or ulcers Psychiatry: Judgement and insight appear normal. Mood & affect appropriate.     Data Reviewed: I have personally reviewed following labs and imaging studies  CBC: Recent Labs  Lab 03/02/18 2234 03/04/18 0536 03/08/18 0906 03/09/18 0515  WBC 9.5 6.9 7.3 6.6  NEUTROABS 7.0 3.7 5.3  --   HGB 13.1 11.8* 12.2 11.3*  HCT 37.7 35.6* 36.1 35.0*  MCV 81.4 85.6 83.6 85.8  PLT 325 278 296 546   Basic Metabolic Panel: Recent Labs  Lab 03/03/18 0445 03/04/18 0536 03/05/18 0533 03/08/18 0906 03/09/18 0515  NA 132* 140 140 128* 134*  K 3.4* 4.5 3.8 3.5 4.2  CL 95* 103 105 90* 99  CO2  26 29 24 25 28   GLUCOSE 164* 127* 136* 219* 115*  BUN 14 16 14 14 10   CREATININE 0.81 0.74 0.74 0.81 0.71  CALCIUM 8.9 9.2 9.0 9.5 8.9  MG  --  1.6* 1.7  --   --    GFR: Estimated Creatinine Clearance: 52.8 mL/min (by C-G formula based on SCr of 0.71 mg/dL). Liver Function Tests: Recent Labs  Lab 03/02/18 2234 03/04/18 0536 03/08/18 0906 03/09/18 0515  AST 18 13* 22 13*  ALT 13 12 15 10   ALKPHOS 77 58 60 51  BILITOT 0.9 0.6 1.1 0.9  PROT 7.5 6.0* 6.9 6.1*  ALBUMIN 4.3  3.4* 4.0 3.4*   Recent Labs  Lab 03/02/18 2234 03/08/18 0906  LIPASE 33 32   No results for input(s): AMMONIA in the last 168 hours. Coagulation Profile: Recent Labs  Lab 03/08/18 0906  INR 1.00   Cardiac Enzymes: Recent Labs  Lab 03/08/18 0906  TROPONINI <0.03   BNP (last 3 results) No results for input(s): PROBNP in the last 8760 hours. HbA1C: No results for input(s): HGBA1C in the last 72 hours. CBG: Recent Labs  Lab 03/05/18 1215 03/08/18 2240 03/09/18 0745 03/09/18 1129 03/09/18 1616  GLUCAP 156* 111* 117* 107* 102*   Lipid Profile: No results for input(s): CHOL, HDL, LDLCALC, TRIG, CHOLHDL, LDLDIRECT in the last 72 hours. Thyroid Function Tests: No results for input(s): TSH, T4TOTAL, FREET4, T3FREE, THYROIDAB in the last 72 hours. Anemia Panel: No results for input(s): VITAMINB12, FOLATE, FERRITIN, TIBC, IRON, RETICCTPCT in the last 72 hours. Sepsis Labs: Recent Labs  Lab 03/02/18 2244 03/08/18 0906 03/08/18 1150  LATICACIDVEN 1.89 3.1* 2.7*    Recent Results (from the past 240 hour(s))  Urine culture     Status: Abnormal   Collection Time: 03/02/18 11:31 PM  Result Value Ref Range Status   Specimen Description   Final    URINE, RANDOM Performed at [facility] [address] [place], [place] [address]    Special Requests   Final    NONE Performed at [facility], [place] [address], [place], [place] [address]    Culture >=100,000 COLONIES/mL ESCHERICHIA COLI (A)  Final   Report Status 03/05/2018 FINAL  Final   Organism ID, Bacteria ESCHERICHIA COLI (A)  Final      Susceptibility   Escherichia coli - MIC*    AMPICILLIN >=32 RESISTANT Resistant     CEFAZOLIN 8 SENSITIVE Sensitive     CEFTRIAXONE <=1 SENSITIVE Sensitive     CIPROFLOXACIN >=4 RESISTANT Resistant     GENTAMICIN <=1 SENSITIVE Sensitive     IMIPENEM <=0.25 SENSITIVE Sensitive     NITROFURANTOIN 32 SENSITIVE Sensitive     TRIMETH/SULFA <=20  SENSITIVE Sensitive     AMPICILLIN/SULBACTAM >=32 RESISTANT Resistant     PIP/TAZO <=4 SENSITIVE Sensitive     Extended ESBL NEGATIVE Sensitive     * >=100,000 COLONIES/mL ESCHERICHIA COLI  Urine culture     Status: Abnormal   Collection Time: 03/08/18 10:09 AM  Result Value Ref Range Status   Specimen Description   Final    URINE, CLEAN CATCH Performed at [facility], [address], [place], [place] [address]    Special Requests   Final    NONE Performed at [facility], [address], [place], [place] [address]    Culture (A)  Final    <10,000 COLONIES/mL INSIGNIFICANT GROWTH Performed at [facility] [address] [place],  [address]  Report Status 03/09/2018 FINAL  Final         Radiology Studies: Dg Chest 2 View  Result Date: 03/08/2018 CLINICAL DATA:  84 year old female with a history of weakness EXAM: CHEST - 2 VIEW COMPARISON:  01/27/2018, 05/13/2017 FINDINGS: Cardiomediastinal silhouette unchanged in size and contour. No pneumothorax. No pleural effusion. Coarsened interstitial markings, similar to the prior. No confluent airspace disease. No acute displaced fracture.  Degenerative changes of the spine. IMPRESSION: Chronic changes without evidence of acute cardiopulmonary disease Electronically Signed   By: Corrie Mckusick D.O.   On: 03/08/2018 10:50   Ct Abdomen Pelvis W Contrast  Result Date: 03/08/2018 CLINICAL DATA:  Worsening abdominal pain and diarrhea for several days. C difficile colitis. EXAM: CT ABDOMEN AND PELVIS WITH CONTRAST TECHNIQUE: Multidetector CT imaging of the abdomen and pelvis was performed using the standard protocol following bolus administration of intravenous contrast. CONTRAST:  138mL ISOVUE-300 IOPAMIDOL (ISOVUE-300) INJECTION 61% COMPARISON:  02/27/2018 FINDINGS: Lower Chest: No acute findings. Hepatobiliary: No hepatic masses identified. Multiple small calcified gallstones are seen, however there is no evidence of cholecystitis  or biliary ductal dilatation. Pancreas:  No mass or inflammatory changes. Spleen: Within normal limits in size and appearance. Adrenals/Urinary Tract: No masses identified. No evidence of hydronephrosis. Stomach/Bowel: No evidence of obstruction, inflammatory process or abnormal fluid collections. Vascular/Lymphatic: No pathologically enlarged lymph nodes. No abdominal aortic aneurysm. Aortic atherosclerosis. Reproductive: Prior hysterectomy noted. Adnexal regions are unremarkable in appearance. Other: Surgical mesh is seen within the anterior abdominal wall, without evidence of recurrent hernia. Musculoskeletal:  No suspicious bone lesions identified. IMPRESSION: No radiographic evidence of colitis or other acute findings. Cholelithiasis.  No radiographic evidence of cholecystitis. Electronically Signed   By: Earle Gell M.D.   On: 03/08/2018 11:37        Scheduled Meds: . amLODipine  10 mg Oral Daily  . enoxaparin (LOVENOX) injection  40 mg Subcutaneous Q24H  . hydrALAZINE  10 mg Oral TID  . losartan  100 mg Oral Daily  . vancomycin  125 mg Oral QID   Continuous Infusions: . sodium chloride 100 mL/hr at 03/08/18 1124     LOS: 0 days    Time spent: 68mins    Kathie Dike, MD [facility] Pager [phone]  If 7PM-7AM, please contact night-coverage [email or web address] Password [facility] 03/09/2018, 6:44 PM

## 2018-03-09 NOTE — Telephone Encounter (Signed)
Patient was admitted to hospital again yesterday

## 2018-03-09 NOTE — Telephone Encounter (Signed)
[facility] calling for verbal order for patient to get skilled nursing care in home. [phone]

## 2018-03-09 NOTE — Clinical Social Work Note (Signed)
Clinical Social Work Assessment  Patient Details  Name: [name] MRN: [id number] Date of Birth: 06-04-1932  Date of referral:  03/09/18               Reason for consult:  Facility Placement                Permission sought to share information with:    Permission granted to share information::     Name::        Agency::     Relationship::     Contact Information:     Housing/Transportation Living arrangements for the past 2 months:  Single Family Home Source of Information:  Patient Patient Interpreter Needed:  None Criminal Activity/Legal Involvement Pertinent to Current Situation/Hospitalization:  No - Comment as needed Significant Relationships:  Adult Children, Other Family Members Lives with:  Self Do you feel safe going back to the place where you live?  Yes Need for family participation in patient care:  Yes (Comment)  Care giving concerns:  None identified.    Social Worker assessment / plan:  At baseline, patient ambulates with a cane.  She is independent in her ADLs. She is agreeable to short term rehab, but understands that she is in observation and it will be private pay.  Patient is also interested in ALF.    Employment status:  Retired Forensic scientist:  Medicare PT Recommendations:  [place] / Referral to community resources:  [place]  Patient/Family's Response to care:  Patient is agreeable to placement.   Patient/Family's Understanding of and Emotional Response to Diagnosis, Current Treatment, and Prognosis:  Patient understands her diagnosis, treatment and prognosis and feels she can best be managed in a placement currently.   Emotional Assessment Appearance:  Appears stated age Attitude/Demeanor/Rapport:    Affect (typically observed):  Accepting, Calm Orientation:  Oriented to Self, Oriented to Place, Oriented to  Time, Oriented to Situation Alcohol / Substance use:  Not Applicable Psych  involvement (Current and /or in the community):  No (Comment)  Discharge Needs  Concerns to be addressed:  Discharge Planning Concerns Readmission within the last 30 days:  Yes Current discharge risk:  None Barriers to Discharge:  No Barriers Identified   Ihor Gully, LCSW 03/09/2018, 4:10 PM

## 2018-03-09 NOTE — Care Management Obs Status (Signed)
[facility] NOTIFICATION   Patient Details  Name: [name] MRN: [id number] Date of Birth: 11/17/1931   Medicare Observation Status Notification Given:  Yes    Sherald Barge, RN 03/09/2018, 10:32 AM

## 2018-03-09 NOTE — Evaluation (Signed)
Physical Therapy Evaluation Patient Details Name: [name] MRN: [id number] DOB: [birth year] Today's Date: 03/09/2018   History of Present Illness  [name] is a 84 y.o. female with medical history significant of hypertension, diabetes, recent admission to [facility] for C difficile diarrhea, discharged home on oral vancomycin.  Patient was initially diagnosed with C. difficile as an outpatient and was started on Flagyl.  When her symptoms did not improve, she was admitted twice long hospital and transition oral vancomycin.  She reports at the time of discharge, she was doing well.  Her appetite was improving, she is not having any vomiting.  Overall stool frequency had improved.  Upon returning home, she reports gradually increased in the number of stools.  She is now up to 6-8 bowel movements a day.  This is described as mushy stools.  She has had nausea, but no vomiting.  P.o. intake has been poor.  She is feeling increasingly weak and dizzy on standing today.  She has not had any fever.  No dysuria.    Clinical Impression  Patient demonstrates slow labored movement for sitting up, has to lean on nearby objects for support due to BLE weakness, able to ambulate in hallway using RW without loss of balance, but limited secondary to c/o fatigue and patient tolerated sitting up in chair after therapy.  Patient will benefit from continued physical therapy in hospital and recommended venue below to increase strength, balance, endurance for safe ADLs and gait.    Follow Up Recommendations SNF;Supervision for mobility/OOB    Equipment Recommendations  None recommended by PT    Recommendations for Other Services       Precautions / Restrictions Precautions Precautions: Fall Restrictions Weight Bearing Restrictions: No      Mobility  Bed Mobility Overal bed mobility: Needs Assistance Bed Mobility: Supine to Sit;Sit to Supine     Supine to sit: Supervision Sit to supine:  Supervision   General bed mobility comments: slow labored movement  Transfers Overall transfer level: Needs assistance Equipment used: 1 person hand held assist;None;Rolling walker (2 wheeled) Transfers: Sit to/from [facility] Sit to Stand: Min assist Stand pivot transfers: Min assist       General transfer comment: Min assist without use of AD, Min guard using RW  Ambulation/Gait Ambulation/Gait assistance: Min Web designer (Feet): 40 Feet Assistive device: Rolling walker (2 wheeled) Gait Pattern/deviations: Decreased step length - right;Decreased step length - left;Decreased stride length Gait velocity: decreased   General Gait Details: slow slightly labored cadence without loss of balance, limited secondary to c/o fatigue  Stairs            Wheelchair Mobility    Modified Rankin (Stroke Patients Only)       Balance Overall balance assessment: Needs assistance Sitting-balance support: Feet supported;No upper extremity supported Sitting balance-Leahy Scale: Good     Standing balance support: During functional activity Standing balance-Leahy Scale: Poor Standing balance comment: fair/poor without AD, fair using RW                             Pertinent Vitals/Pain Pain Assessment: Faces Faces Pain Scale: Hurts a little bit Pain Location: stomach discomfort Pain Descriptors / Indicators: Discomfort Pain Intervention(s): Limited activity within patient's tolerance;Monitored during session    Home Living Family/patient expects to be discharged to:: Private residence Living Arrangements: Alone Available Help at Discharge: Family;Available PRN/intermittently Type of Home: [place]  Access: Stairs to enter Entrance Stairs-Rails: Right Entrance Stairs-Number of Steps: 3 Home Layout: Two level Home Equipment: Cane - single point;Walker - 2 wheels;Shower seat;Grab bars - toilet Additional Comments: Pt walks up to a mile a  day - uses cane out side    Prior Function Level of Independence: Independent with assistive device(s)         Comments: Idependent household gait, uses cane for longer distances     Hand Dominance        Extremity/Trunk Assessment   Upper Extremity Assessment Upper Extremity Assessment: Generalized weakness    Lower Extremity Assessment Lower Extremity Assessment: Generalized weakness    Cervical / Trunk Assessment Cervical / Trunk Assessment: Normal  Communication   Communication: No difficulties  Cognition Arousal/Alertness: Awake/alert Behavior During Therapy: WFL for tasks assessed/performed Overall Cognitive Status: Within Functional Limits for tasks assessed                                        General Comments      Exercises     Assessment/Plan    PT Assessment Patient needs continued PT services  PT Problem List Decreased strength;Decreased activity tolerance;Decreased balance;Decreased mobility       PT Treatment Interventions Gait training;Stair training;Functional mobility training;Therapeutic activities;Therapeutic exercise;Patient/family education;DME instruction    PT Goals (Current goals can be found in the Care Plan section)  Acute Rehab PT Goals Patient Stated Goal: return home after regaining strength in rehab PT Goal Formulation: With patient Time For Goal Achievement: 03/23/18 Potential to Achieve Goals: Good    Frequency Min 3X/week   Barriers to discharge        Co-evaluation               AM-PAC PT "6 Clicks" Daily Activity  Outcome Measure Difficulty turning over in bed (including adjusting bedclothes, sheets and blankets)?: None Difficulty moving from lying on back to sitting on the side of the bed? : A Little Difficulty sitting down on and standing up from a chair with arms (e.g., wheelchair, bedside commode, etc,.)?: A Little Help needed moving to and from a bed to chair (including a  wheelchair)?: A Little Help needed walking in hospital room?: A Little Help needed climbing 3-5 steps with a railing? : A Lot 6 Click Score: 18    End of Session   Activity Tolerance: Patient tolerated treatment well;Patient limited by fatigue Patient left: in chair;with call bell/phone within reach Nurse Communication: Mobility status PT Visit Diagnosis: Muscle weakness (generalized) (M62.81);Unsteadiness on feet (R26.81);Other abnormalities of gait and mobility (R26.89)    Time: 4825-0037 PT Time Calculation (min) (ACUTE ONLY): 24 min   Charges:   PT Evaluation $PT Eval Moderate Complexity: 1 Mod PT Treatments $Therapeutic Activity: 23-37 mins        11:29 AM, 03/09/18 Lonell Grandchild, MPT Physical Therapist with [facility] [phone] office [phone] mobile phone

## 2018-03-09 NOTE — Plan of Care (Signed)
  Problem: Acute Rehab PT Goals(only PT should resolve) Goal: Pt Will Go Supine/Side To Sit Outcome: Progressing Flowsheets (Taken 03/09/2018 1130) Pt will go Supine/Side to Sit: with modified independence Goal: Patient Will Transfer Sit To/From Stand Outcome: Progressing Flowsheets (Taken 03/09/2018 1130) Patient will transfer sit to/from stand: with min guard assist Goal: Pt Will Transfer Bed To Chair/Chair To Bed Outcome: Progressing Flowsheets (Taken 03/09/2018 1130) Pt will Transfer Bed to Chair/Chair to Bed: min guard assist Goal: Pt Will Ambulate Outcome: Progressing Flowsheets (Taken 03/09/2018 1130) Pt will Ambulate: 75 feet;with min guard assist;with rolling walker   11:31 AM, 03/09/18 Lonell Grandchild, MPT Physical Therapist with [facility] [phone] office [phone] mobile phone

## 2018-03-09 NOTE — Care Management (Addendum)
Pt admitted with dehydration following recent stay at [facility] for c-diff. Pt unable to care for herself at home, unable to prepare meals and adequately say hydrated. Pt requesting placement at [facility] or [facility]. Pt has long term care policy and aware she does not meet requirements for medicare to pay for STR. She is able to pay for 1 month up front at this time. Discussed with CSW. PT eval pending.

## 2018-03-10 DIAGNOSIS — I10 Essential (primary) hypertension: Secondary | ICD-10-CM | POA: Diagnosis present

## 2018-03-10 DIAGNOSIS — A0472 Enterocolitis due to Clostridium difficile, not specified as recurrent: Secondary | ICD-10-CM | POA: Diagnosis present

## 2018-03-10 DIAGNOSIS — Z23 Encounter for immunization: Secondary | ICD-10-CM | POA: Diagnosis not present

## 2018-03-10 DIAGNOSIS — E871 Hypo-osmolality and hyponatremia: Secondary | ICD-10-CM | POA: Diagnosis present

## 2018-03-10 DIAGNOSIS — Z66 Do not resuscitate: Secondary | ICD-10-CM | POA: Diagnosis present

## 2018-03-10 DIAGNOSIS — Z79899 Other long term (current) drug therapy: Secondary | ICD-10-CM | POA: Diagnosis not present

## 2018-03-10 DIAGNOSIS — E860 Dehydration: Secondary | ICD-10-CM | POA: Diagnosis present

## 2018-03-10 DIAGNOSIS — E872 Acidosis: Secondary | ICD-10-CM | POA: Diagnosis present

## 2018-03-10 DIAGNOSIS — E119 Type 2 diabetes mellitus without complications: Secondary | ICD-10-CM | POA: Diagnosis present

## 2018-03-10 DIAGNOSIS — Z888 Allergy status to other drugs, medicaments and biological substances status: Secondary | ICD-10-CM | POA: Diagnosis not present

## 2018-03-10 DIAGNOSIS — R197 Diarrhea, unspecified: Secondary | ICD-10-CM | POA: Diagnosis not present

## 2018-03-10 DIAGNOSIS — E785 Hyperlipidemia, unspecified: Secondary | ICD-10-CM | POA: Diagnosis present

## 2018-03-10 DIAGNOSIS — Z7984 Long term (current) use of oral hypoglycemic drugs: Secondary | ICD-10-CM | POA: Diagnosis not present

## 2018-03-10 LAB — GLUCOSE, CAPILLARY
GLUCOSE-CAPILLARY: 115 mg/dL — AB (ref 70–99)
GLUCOSE-CAPILLARY: 105 mg/dL — AB (ref 70–99)
Glucose-Capillary: 117 mg/dL — ABNORMAL HIGH (ref 70–99)
Glucose-Capillary: 123 mg/dL — ABNORMAL HIGH (ref 70–99)

## 2018-03-10 LAB — BASIC METABOLIC PANEL
Anion gap: 5 (ref 5–15)
BUN: 10 mg/dL (ref 8–23)
CO2: 29 mmol/L (ref 22–32)
CREATININE: 0.72 mg/dL (ref 0.44–1.00)
Calcium: 9 mg/dL (ref 8.9–10.3)
Chloride: 105 mmol/L (ref 98–111)
GFR calc Af Amer: >60 mL/min (ref >60)
GFR calc non Af Amer: >60 mL/min (ref >60)
Glucose, Bld: 122 mg/dL — ABNORMAL HIGH (ref 70–99)
POTASSIUM: 4 mmol/L (ref 3.5–5.1)
SODIUM: 139 mmol/L (ref 135–145)

## 2018-03-10 LAB — LACTIC ACID, PLASMA: LACTIC ACID, VENOUS: 0.7 mmol/L (ref 0.5–1.9)

## 2018-03-10 MED ORDER — INSULIN ASPART 100 UNIT/ML ~~LOC~~ SOLN
0.0000 [IU] | Freq: Three times a day (TID) | SUBCUTANEOUS | Status: DC
  Administered 2018-03-10 – 2018-03-12 (×4): 1 [IU] via SUBCUTANEOUS

## 2018-03-10 MED ORDER — POLYVINYL ALCOHOL 1.4 % OP SOLN
1.0000 [drp] | OPHTHALMIC | Status: DC | PRN
  Administered 2018-03-11: 1 [drp] via OPHTHALMIC
  Filled 2018-03-10: qty 15

## 2018-03-10 MED ORDER — LIDOCAINE 5 % EX PTCH
1.0000 | MEDICATED_PATCH | CUTANEOUS | Status: DC
  Administered 2018-03-10 – 2018-03-11 (×2): 1 via TRANSDERMAL
  Filled 2018-03-10 (×3): qty 1

## 2018-03-10 MED ORDER — HYDROXYZINE HCL 25 MG PO TABS
25.0000 mg | ORAL_TABLET | Freq: Every evening | ORAL | Status: DC | PRN
  Administered 2018-03-10 – 2018-03-11 (×2): 25 mg via ORAL
  Filled 2018-03-10 (×2): qty 1

## 2018-03-10 MED ORDER — INSULIN ASPART 100 UNIT/ML ~~LOC~~ SOLN: 0.0000 [IU] | Freq: Every day | SUBCUTANEOUS | Status: DC

## 2018-03-10 NOTE — Progress Notes (Signed)
PROGRESS NOTE    [name]  XNT:[id number] DOB: 02/26/1932 DOA: 03/08/2018 PCP: Kathyrn Drown, MD    Brief Narrative:  84 year old female with a history of hypertension, diabetes and recent admission for C. difficile diarrhea, returns to the hospital with worsening diarrhea, dehydration and hyponatremia.  She is continued on oral vancomycin and started on IV hydration.  We will continue supportive management and monitor for progression of stools.  Seen by physical therapy with recommendations for skilled nursing facility placement.   Assessment & Plan:   Active Problems:   Controlled type 2 diabetes mellitus without complication, without long-term current use of insulin (HCC)   HLD (hyperlipidemia)   Essential hypertension   C. difficile diarrhea   Vomiting and diarrhea   Dehydration   Hyponatremia   1. C. difficile diarrhea.  Continue oral vancomycin.  Stools appear to be slowly improving.  Continue current measures. 2. Lactic acidosis.  Resolved with IV hydration 3. Hyponatremia.  Related to volume depletion.  Resolved with IV hydration. 4. Diabetes.  Oral agents currently on hold.  On sliding scale insulin.  Blood sugars are stable. 5. Hypertension.  Blood pressure stable.  Continue on amlodipine, losartan and hydralazine. 6. Generalized weakness.  Seen by physical therapy with recommendations for skilled nursing facility placement.  Currently awaiting placement.   DVT prophylaxis: Lovenox Code Status: DNR Family Communication: No family present Disposition Plan: Probable placement on discharge   Consultants:     Procedures:     Antimicrobials:   Oral vancomycin   Subjective: Feels that bowel movements are improving.  She had 2 bowel movements yesterday.  She had a small bowel movement today.  Objective: Vitals:   03/09/18 2013 03/09/18 2226 03/10/18 0608 03/10/18 1531  BP:  (!) 147/67 (!) 157/53 (!) 150/53  Pulse:  86 68 65  Resp:  20 17 16   Temp:   97.9 F (36.6 C) 98.2 F (36.8 C) 98 F (36.7 C)  TempSrc:  Oral Oral Oral  SpO2: 99% 97% 98% 100%  Weight:      Height:        Intake/Output Summary (Last 24 hours) at 03/10/2018 1750 Last data filed at 03/10/2018 1500 Gross per 24 hour  Intake 2757.92 ml  Output 400 ml  Net 2357.92 ml   Filed Weights   03/08/18 1347  Weight: 72.6 kg    Examination:  General exam: Alert, awake, oriented x 3 Respiratory system: Clear to auscultation. Respiratory effort normal. Cardiovascular system:RRR. No murmurs, rubs, gallops. Gastrointestinal system: Abdomen is nondistended, soft and nontender. No organomegaly or masses felt. Normal bowel sounds heard. Central nervous system: Alert and oriented. No focal neurological deficits. Extremities: No C/C/E, +pedal pulses Skin: No rashes, lesions or ulcers Psychiatry: Judgement and insight appear normal. Mood & affect appropriate.    Data Reviewed: I have personally reviewed following labs and imaging studies  CBC: Recent Labs  Lab 03/04/18 0536 03/08/18 0906 03/09/18 0515  WBC 6.9 7.3 6.6  NEUTROABS 3.7 5.3  --   HGB 11.8* 12.2 11.3*  HCT 35.6* 36.1 35.0*  MCV 85.6 83.6 85.8  PLT 278 296 967   Basic Metabolic Panel: Recent Labs  Lab 03/04/18 0536 03/05/18 0533 03/08/18 0906 03/09/18 0515 03/10/18 0546  NA 140 140 128* 134* 139  K 4.5 3.8 3.5 4.2 4.0  CL 103 105 90* 99 105  CO2 29 24 25 28 29   GLUCOSE 127* 136* 219* 115* 122*  BUN 16 14 14 10 10   CREATININE  0.74 0.74 0.81 0.71 0.72  CALCIUM 9.2 9.0 9.5 8.9 9.0  MG 1.6* 1.7  --   --   --    GFR: Estimated Creatinine Clearance: 52.8 mL/min (by C-G formula based on SCr of 0.72 mg/dL). Liver Function Tests: Recent Labs  Lab 03/04/18 0536 03/08/18 0906 03/09/18 0515  AST 13* 22 13*  ALT 12 15 10   ALKPHOS 58 60 51  BILITOT 0.6 1.1 0.9  PROT 6.0* 6.9 6.1*  ALBUMIN 3.4* 4.0 3.4*   Recent Labs  Lab 03/08/18 0906  LIPASE 32   No results for input(s): AMMONIA in  the last 168 hours. Coagulation Profile: Recent Labs  Lab 03/08/18 0906  INR 1.00   Cardiac Enzymes: Recent Labs  Lab 03/08/18 0906  TROPONINI <0.03   BNP (last 3 results) No results for input(s): PROBNP in the last 8760 hours. HbA1C: No results for input(s): HGBA1C in the last 72 hours. CBG: Recent Labs  Lab 03/09/18 1616 03/09/18 2225 03/10/18 0733 03/10/18 1134 03/10/18 1608  GLUCAP 102* 105* 117* 123* 115*   Lipid Profile: No results for input(s): CHOL, HDL, LDLCALC, TRIG, CHOLHDL, LDLDIRECT in the last 72 hours. Thyroid Function Tests: No results for input(s): TSH, T4TOTAL, FREET4, T3FREE, THYROIDAB in the last 72 hours. Anemia Panel: No results for input(s): VITAMINB12, FOLATE, FERRITIN, TIBC, IRON, RETICCTPCT in the last 72 hours. Sepsis Labs: Recent Labs  Lab 03/08/18 0906 03/08/18 1150 03/10/18 0546  LATICACIDVEN 3.1* 2.7* 0.7    Recent Results (from the past 240 hour(s))  Urine culture     Status: Abnormal   Collection Time: 03/02/18 11:31 PM  Result Value Ref Range Status   Specimen Description   Final    URINE, RANDOM Performed at [facility] [address] [place], [place] [address]    Special Requests   Final    NONE Performed at [facility], [place] [address], [place], [place] [address]    Culture >=100,000 COLONIES/mL ESCHERICHIA COLI (A)  Final   Report Status 03/05/2018 FINAL  Final   Organism ID, Bacteria ESCHERICHIA COLI (A)  Final      Susceptibility   Escherichia coli - MIC*    AMPICILLIN >=32 RESISTANT Resistant     CEFAZOLIN 8 SENSITIVE Sensitive     CEFTRIAXONE <=1 SENSITIVE Sensitive     CIPROFLOXACIN >=4 RESISTANT Resistant     GENTAMICIN <=1 SENSITIVE Sensitive     IMIPENEM <=0.25 SENSITIVE Sensitive     NITROFURANTOIN 32 SENSITIVE Sensitive     TRIMETH/SULFA <=20 SENSITIVE Sensitive     AMPICILLIN/SULBACTAM >=32 RESISTANT Resistant     PIP/TAZO <=4 SENSITIVE Sensitive     Extended  ESBL NEGATIVE Sensitive     * >=100,000 COLONIES/mL ESCHERICHIA COLI  Urine culture     Status: Abnormal   Collection Time: 03/08/18 10:09 AM  Result Value Ref Range Status   Specimen Description   Final    URINE, CLEAN CATCH Performed at [facility], [address], [place], [place] [address]    Special Requests   Final    NONE Performed at [facility], [address], [place], [place] [address]    Culture (A)  Final    <10,000 COLONIES/mL INSIGNIFICANT GROWTH Performed at [facility] [address], [place], [place] [address]    Report Status 03/09/2018 FINAL  Final         Radiology Studies: No results found.      Scheduled Meds: . amLODipine  10 mg Oral Daily  . enoxaparin (LOVENOX) injection  40 mg Subcutaneous Q24H  . hydrALAZINE  10 mg Oral TID  . insulin aspart  0-5 Units Subcutaneous QHS  . insulin aspart  0-9 Units Subcutaneous TID WC  . lidocaine  1 patch Transdermal Q24H  . losartan  100 mg Oral Daily  . vancomycin  125 mg Oral QID   Continuous Infusions:    LOS: 0 days    Time spent: 63mins    Kathie Dike, MD [facility] Pager [phone]  If 7PM-7AM, please contact night-coverage [email or web address] Password [email or web address] 03/10/2018, 5:50 PM

## 2018-03-10 NOTE — Progress Notes (Signed)
Physical Therapy Treatment Patient Details Name: [name] MRN: [id number] DOB: 1932-04-18 Today's Date: 03/10/2018    History of Present Illness [name] is a 84 y.o. female with medical history significant of hypertension, diabetes, recent admission to [facility] long hospital for C difficile diarrhea, discharged home on oral vancomycin.  Patient was initially diagnosed with C. difficile as an outpatient and was started on Flagyl.  When her symptoms did not improve, she was admitted twice long hospital and transition oral vancomycin.  She reports at the time of discharge, she was doing well.  Her appetite was improving, she is not having any vomiting.  Overall stool frequency had improved.  Upon returning home, she reports gradually increased in the number of stools.  She is now up to 6-8 bowel movements a day.  This is described as mushy stools.  She has had nausea, but no vomiting.  P.o. intake has been poor.  She is feeling increasingly weak and dizzy on standing today.  She has not had any fever.  No dysuria.    PT Comments    Pt is demonstrating better control of standing balance despite weakness of GI origin and dehydration, now affecting endurance and control of proximal mm's.  Pt is weak to control sitting descent and gait, but even with std walker in her room could make a better standing effort.  Follow acutely and will anticipate return to SNF when medically ready.  Answered pt's questions at end of session.   Follow Up Recommendations  SNF     Equipment Recommendations  None recommended by PT    Recommendations for Other Services       Precautions / Restrictions Precautions Precautions: Fall Restrictions Weight Bearing Restrictions: No    Mobility  Bed Mobility Overal bed mobility: Needs Assistance Bed Mobility: Supine to Sit;Sit to Supine     Supine to sit: Supervision Sit to supine: Supervision   General bed mobility comments: slow labored  movement  Transfers Overall transfer level: Needs assistance Equipment used: Straight cane Transfers: Sit to/from Stand Sit to Stand: Min guard;Min assist Stand pivot transfers: Min guard;Min assist       General transfer comment: Min assist without use of AD, Min guard using RW  Ambulation/Gait Ambulation/Gait assistance: Min assist Gait Distance (Feet): 40 Feet Assistive device: Standard walker Gait Pattern/deviations: Decreased step length - right;Decreased step length - left;Decreased stride length Gait velocity: decreased   General Gait Details: slow pace but more controlled despite using standard walker   Stairs             Wheelchair Mobility    Modified Rankin (Stroke Patients Only)       Balance Overall balance assessment: Needs assistance Sitting-balance support: Feet supported Sitting balance-Leahy Scale: Good       Standing balance-Leahy Scale: Fair Standing balance comment: less than fair once dynamic                            Cognition Arousal/Alertness: Awake/alert Behavior During Therapy: WFL for tasks assessed/performed Overall Cognitive Status: Within Functional Limits for tasks assessed                                 General Comments: wants to go directly home      Exercises General Exercises - Lower Extremity Ankle Circles/Pumps: AROM;Both;5 reps Long Arc Quad: Strengthening;Both;10 reps Heel Slides:  Strengthening;Both;10 reps Hip ABduction/ADduction: Strengthening;Both;10 reps    General Comments        Pertinent Vitals/Pain Pain Assessment: Faces Faces Pain Scale: Hurts a little bit Pain Location: stomach discomfort Pain Descriptors / Indicators: Discomfort Pain Intervention(s): Limited activity within patient's tolerance;Monitored during session;Premedicated before session;Repositioned;Patient requesting pain meds-RN notified    Home Living                      Prior Function             PT Goals (current goals can now be found in the care plan section) Acute Rehab PT Goals Patient Stated Goal: return home after regaining strength in rehab Progress towards PT goals: Progressing toward goals    Frequency    Min 3X/week      PT Plan Current plan remains appropriate    Co-evaluation              AM-PAC PT "6 Clicks" Daily Activity  Outcome Measure  Difficulty turning over in bed (including adjusting bedclothes, sheets and blankets)?: None Difficulty moving from lying on back to sitting on the side of the bed? : A Little Difficulty sitting down on and standing up from a chair with arms (e.g., wheelchair, bedside commode, etc,.)?: Unable Help needed moving to and from a bed to chair (including a wheelchair)?: A Little Help needed walking in hospital room?: A Little Help needed climbing 3-5 steps with a railing? : A Little 6 Click Score: 17    End of Session Equipment Utilized During Treatment: Gait belt Activity Tolerance: Patient tolerated treatment well;Patient limited by fatigue Patient left: in chair;with call bell/phone within reach;with chair alarm set Nurse Communication: Mobility status PT Visit Diagnosis: Muscle weakness (generalized) (M62.81);Unsteadiness on feet (R26.81);Other abnormalities of gait and mobility (R26.89)     Time: 0981-1914 PT Time Calculation (min) (ACUTE ONLY): 26 min  Charges:  $Gait Training: 8-22 mins $Therapeutic Exercise: 8-22 mins                     Ramond Dial 03/10/2018, 10:53 PM   10:55 PM, 03/10/18 Mee Hives, PT, MS Physical Therapist - [place] [phone] (Office)

## 2018-03-10 NOTE — Telephone Encounter (Signed)
-  When she is discharged she may be going to nursing home but then eventually will do follow-up here

## 2018-03-11 ENCOUNTER — Telehealth: Payer: Self-pay | Admitting: Family Medicine

## 2018-03-11 ENCOUNTER — Ambulatory Visit: Payer: Medicare Other | Admitting: Family Medicine

## 2018-03-11 LAB — GLUCOSE, CAPILLARY
GLUCOSE-CAPILLARY: 133 mg/dL — AB (ref 70–99)
GLUCOSE-CAPILLARY: 95 mg/dL (ref 70–99)
Glucose-Capillary: 128 mg/dL — ABNORMAL HIGH (ref 70–99)
Glucose-Capillary: 113 mg/dL — ABNORMAL HIGH (ref 70–99)

## 2018-03-11 NOTE — Telephone Encounter (Signed)
Contacted Caryl Pina and gave the verbal order that she needed.

## 2018-03-11 NOTE — Telephone Encounter (Signed)
Please go ahead with verbal order thank you 

## 2018-03-11 NOTE — Progress Notes (Signed)
Physical Therapy Treatment Patient Details Name: [name] MRN: [id number] DOB: 1931/11/18 Today's Date: 03/11/2018    History of Present Illness [name] is a 84 y.o. female with medical history significant of hypertension, diabetes, recent admission to [facility] long hospital for C difficile diarrhea, discharged home on oral vancomycin.  Patient was initially diagnosed with C. difficile as an outpatient and was started on Flagyl.  When her symptoms did not improve, she was admitted twice long hospital and transition oral vancomycin.  She reports at the time of discharge, she was doing well.  Her appetite was improving, she is not having any vomiting.  Overall stool frequency had improved.  Upon returning home, she reports gradually increased in the number of stools.  She is now up to 6-8 bowel movements a day.  This is described as mushy stools.  She has had nausea, but no vomiting.  P.o. intake has been poor.  She is feeling increasingly weak and dizzy on standing today.  She has not had any fever.  No dysuria.    PT Comments    Patient demonstrates increased BLE strength for sit to stands and taking steps without use of an AD for short distances, increased endurance/distance for gait training without loss of balance and tolerated sitting up in chair after therapy.  Patient will benefit from continued physical therapy in hospital and recommended venue below to increase strength, balance, endurance for safe ADLs and gait.    Follow Up Recommendations  SNF     Equipment Recommendations  None recommended by PT    Recommendations for Other Services       Precautions / Restrictions Precautions Precautions: Fall Restrictions Weight Bearing Restrictions: No    Mobility  Bed Mobility Overal bed mobility: Modified Independent             General bed mobility comments: uses bed rail for sitting up  Transfers Overall transfer level: Needs assistance Equipment used: Rolling  walker (2 wheeled) Transfers: Sit to/from [facility] Sit to Stand: Min guard Stand pivot transfers: Min guard       General transfer comment: demonstrates improved standing balance  Ambulation/Gait Ambulation/Gait assistance: Min guard Gait Distance (Feet): 80 Feet Assistive device: Rolling walker (2 wheeled) Gait Pattern/deviations: Decreased step length - right;Decreased step length - left;Decreased stride length Gait velocity: decreased   General Gait Details: demonstrates increased endurance/distance with slightly labored slow cadence without loss of balance, limited secondary to c/o fatigue   Stairs             Wheelchair Mobility    Modified Rankin (Stroke Patients Only)       Balance Overall balance assessment: Needs assistance Sitting-balance support: Feet supported;No upper extremity supported Sitting balance-Leahy Scale: Good     Standing balance support: During functional activity;No upper extremity supported Standing balance-Leahy Scale: Fair Standing balance comment: fair/good with RW                            Cognition Arousal/Alertness: Awake/alert Behavior During Therapy: WFL for tasks assessed/performed Overall Cognitive Status: Within Functional Limits for tasks assessed                                        Exercises General Exercises - Lower Extremity Long Arc Quad: Seated;AROM;Strengthening;Both;10 reps Hip Flexion/Marching: Seated;AROM;Strengthening;Both;10 reps Toe Raises: Seated;AROM;Strengthening;Both;10 reps  Heel Raises: Seated;AROM;Strengthening;Both;10 reps    General Comments        Pertinent Vitals/Pain Pain Assessment: No/denies pain    Home Living                      Prior Function            PT Goals (current goals can now be found in the care plan section) Acute Rehab PT Goals Patient Stated Goal: return home after regaining strength in rehab PT Goal  Formulation: With patient Time For Goal Achievement: 03/23/18 Potential to Achieve Goals: Good Progress towards PT goals: Progressing toward goals    Frequency    Min 3X/week      PT Plan Current plan remains appropriate    Co-evaluation              AM-PAC PT "6 Clicks" Daily Activity  Outcome Measure  Difficulty turning over in bed (including adjusting bedclothes, sheets and blankets)?: None Difficulty moving from lying on back to sitting on the side of the bed? : None Difficulty sitting down on and standing up from a chair with arms (e.g., wheelchair, bedside commode, etc,.)?: None Help needed moving to and from a bed to chair (including a wheelchair)?: A Little Help needed walking in hospital room?: A Little Help needed climbing 3-5 steps with a railing? : A Little 6 Click Score: 21    End of Session   Activity Tolerance: Patient tolerated treatment well;Patient limited by fatigue Patient left: in chair;with call bell/phone within reach Nurse Communication: Mobility status PT Visit Diagnosis: Muscle weakness (generalized) (M62.81);Unsteadiness on feet (R26.81);Other abnormalities of gait and mobility (R26.89)     Time: 5038-8828 PT Time Calculation (min) (ACUTE ONLY): 27 min  Charges:  $Gait Training: 8-22 mins $Therapeutic Exercise: 8-22 mins                     2:17 PM, 03/11/18 Lonell Grandchild, MPT Physical Therapist with [facility] [phone] office [phone] mobile phone

## 2018-03-11 NOTE — Progress Notes (Signed)
PROGRESS NOTE    [name]  FIE:[id number] DOB: [birth year] DOA: 03/08/2018 PCP: Kathyrn Drown, MD    Brief Narrative:  84 year old female with a history of hypertension, diabetes and recent admission for C. difficile diarrhea, returns to the hospital with worsening diarrhea, dehydration and hyponatremia.  She is continued on oral vancomycin and started on IV hydration.  We will continue supportive management and monitor for progression of stools.  Seen by physical therapy with recommendations for skilled nursing facility placement.   Assessment & Plan:   Active Problems:   Controlled type 2 diabetes mellitus without complication, without long-term current use of insulin (HCC)   HLD (hyperlipidemia)   Essential hypertension   C. difficile diarrhea   Vomiting and diarrhea   Dehydration   Hyponatremia   1. C. difficile diarrhea.  Improving with oral vancomycin.  Continue current treatments. 2. Lactic acidosis.  Resolved with IV hydration.  IV fluids have been discontinued 3. Hyponatremia.  Related to volume depletion.  Resolved with IV hydration.  IV fluids have been discontinued 4. Diabetes.  Oral agents currently on hold.  On sliding scale insulin.  Blood sugars are stable. 5. Hypertension.  Blood pressure stable.  Continue on amlodipine, losartan and hydralazine. 6. Generalized weakness.  Seen by physical therapy with recommendations for skilled nursing facility placement.  Currently awaiting placement.   DVT prophylaxis: Lovenox Code Status: DNR Family Communication: No family present Disposition Plan: Awaiting bed placement at skilled nursing facility   Consultants:     Procedures:     Antimicrobials:   Oral vancomycin   Subjective: Bowel movements are less frequent and decreased volume.  P.o. intake appears to be improving.  Feeling better.  Objective: Vitals:   03/10/18 1531 03/10/18 2221 03/11/18 0532 03/11/18 1325  BP: (!) 150/53 (!) 128/54 (!)  144/58 (!) 135/56  Pulse: 65 (!) 57 (!) 58 65  Resp: 16 18 16 18   Temp: 98 F (36.7 C) 98.3 F (36.8 C) 98.1 F (36.7 C) 98.2 F (36.8 C)  TempSrc: Oral Oral Oral Oral  SpO2: 100% 98% 98% 100%  Weight:      Height:        Intake/Output Summary (Last 24 hours) at 03/11/2018 1856 Last data filed at 03/11/2018 1300 Gross per 24 hour  Intake 480 ml  Output 1750 ml  Net -1270 ml   Filed Weights   03/08/18 1347  Weight: 72.6 kg    Examination:  General exam: Alert, awake, oriented x 3 Respiratory system: Clear to auscultation. Respiratory effort normal. Cardiovascular system:RRR. No murmurs, rubs, gallops. Gastrointestinal system: Abdomen is nondistended, soft and nontender. No organomegaly or masses felt. Normal bowel sounds heard. Central nervous system: Alert and oriented. No focal neurological deficits. Extremities: No C/C/E, +pedal pulses Skin: No rashes, lesions or ulcers Psychiatry: Judgement and insight appear normal. Mood & affect appropriate.    Data Reviewed: I have personally reviewed following labs and imaging studies  CBC: Recent Labs  Lab 03/08/18 0906 03/09/18 0515  WBC 7.3 6.6  NEUTROABS 5.3  --   HGB 12.2 11.3*  HCT 36.1 35.0*  MCV 83.6 85.8  PLT 296 166   Basic Metabolic Panel: Recent Labs  Lab 03/05/18 0533 03/08/18 0906 03/09/18 0515 03/10/18 0546  NA 140 128* 134* 139  K 3.8 3.5 4.2 4.0  CL 105 90* 99 105  CO2 24 25 28 29   GLUCOSE 136* 219* 115* 122*  BUN 14 14 10 10   CREATININE 0.74 0.81 0.71 0.72  CALCIUM 9.0 9.5 8.9 9.0  MG 1.7  --   --   --    GFR: Estimated Creatinine Clearance: 52.8 mL/min (by C-G formula based on SCr of 0.72 mg/dL). Liver Function Tests: Recent Labs  Lab 03/08/18 0906 03/09/18 0515  AST 22 13*  ALT 15 10  ALKPHOS 60 51  BILITOT 1.1 0.9  PROT 6.9 6.1*  ALBUMIN 4.0 3.4*   Recent Labs  Lab 03/08/18 0906  LIPASE 32   No results for input(s): AMMONIA in the last 168 hours. Coagulation  Profile: Recent Labs  Lab 03/08/18 0906  INR 1.00   Cardiac Enzymes: Recent Labs  Lab 03/08/18 0906  TROPONINI <0.03   BNP (last 3 results) No results for input(s): PROBNP in the last 8760 hours. HbA1C: No results for input(s): HGBA1C in the last 72 hours. CBG: Recent Labs  Lab 03/10/18 1608 03/10/18 2223 03/11/18 0752 03/11/18 1151 03/11/18 1643  GLUCAP 115* 105* 133* 95 128*   Lipid Profile: No results for input(s): CHOL, HDL, LDLCALC, TRIG, CHOLHDL, LDLDIRECT in the last 72 hours. Thyroid Function Tests: No results for input(s): TSH, T4TOTAL, FREET4, T3FREE, THYROIDAB in the last 72 hours. Anemia Panel: No results for input(s): VITAMINB12, FOLATE, FERRITIN, TIBC, IRON, RETICCTPCT in the last 72 hours. Sepsis Labs: Recent Labs  Lab 03/08/18 0906 03/08/18 1150 03/10/18 0546  LATICACIDVEN 3.1* 2.7* 0.7    Recent Results (from the past 240 hour(s))  Urine culture     Status: Abnormal   Collection Time: 03/02/18 11:31 PM  Result Value Ref Range Status   Specimen Description   Final    URINE, RANDOM Performed at [facility] [address] [place], [place] [address]    Special Requests   Final    NONE Performed at [facility], [place] [address], [place], [place] [address]    Culture >=100,000 COLONIES/mL ESCHERICHIA COLI (A)  Final   Report Status 03/05/2018 FINAL  Final   Organism ID, Bacteria ESCHERICHIA COLI (A)  Final      Susceptibility   Escherichia coli - MIC*    AMPICILLIN >=32 RESISTANT Resistant     CEFAZOLIN 8 SENSITIVE Sensitive     CEFTRIAXONE <=1 SENSITIVE Sensitive     CIPROFLOXACIN >=4 RESISTANT Resistant     GENTAMICIN <=1 SENSITIVE Sensitive     IMIPENEM <=0.25 SENSITIVE Sensitive     NITROFURANTOIN 32 SENSITIVE Sensitive     TRIMETH/SULFA <=20 SENSITIVE Sensitive     AMPICILLIN/SULBACTAM >=32 RESISTANT Resistant     PIP/TAZO <=4 SENSITIVE Sensitive     Extended ESBL NEGATIVE Sensitive     *  >=100,000 COLONIES/mL ESCHERICHIA COLI  Urine culture     Status: Abnormal   Collection Time: 03/08/18 10:09 AM  Result Value Ref Range Status   Specimen Description   Final    URINE, CLEAN CATCH Performed at [facility], [address] [place], [place] [address]    Special Requests   Final    NONE Performed at [facility], [address] [place], [place] [address]    Culture (A)  Final    <10,000 COLONIES/mL INSIGNIFICANT GROWTH Performed at [facility] [address], [place], [place] [address]    Report Status 03/09/2018 FINAL  Final         Radiology Studies: No results found.      Scheduled Meds: . amLODipine  10 mg Oral Daily  . enoxaparin (LOVENOX) injection  40 mg Subcutaneous Q24H  .  hydrALAZINE  10 mg Oral TID  . insulin aspart  0-5 Units Subcutaneous QHS  . insulin aspart  0-9 Units Subcutaneous TID WC  . lidocaine  1 patch Transdermal Q24H  . losartan  100 mg Oral Daily  . vancomycin  125 mg Oral QID   Continuous Infusions:    LOS: 1 day    Time spent: 66mins    Kathie Dike, MD [facility] Pager [phone]  If 7PM-7AM, please contact night-coverage [email or web address] Password [facility] 03/11/2018, 6:56 PM

## 2018-03-11 NOTE — Telephone Encounter (Signed)
Original message from 03-09-18>    [facility] calling for verbal order for patient to get skilled nursing care in home. [facility] calling back because she still needs a verbal order to cover the initial visit she has already done and then she will re-evaluate after the patient is dcischarged from the hospital.  Phone # above is the correct phone number.

## 2018-03-11 NOTE — Progress Notes (Signed)
SOCIAL WORK - please see patient Wed AM - patient is requesting different placement location after speaking with son and another facility that would be closer to family.  No longer wishes to go to [facility]

## 2018-03-12 LAB — GLUCOSE, CAPILLARY
Glucose-Capillary: 115 mg/dL — ABNORMAL HIGH (ref 70–99)
Glucose-Capillary: 137 mg/dL — ABNORMAL HIGH (ref 70–99)

## 2018-03-12 MED ORDER — VANCOMYCIN 50 MG/ML ORAL SOLUTION: 125.0000 mg | Freq: Four times a day (QID) | ORAL | 0 refills | Status: AC

## 2018-03-12 MED ORDER — HYDRALAZINE HCL 10 MG PO TABS: 10.0000 mg | ORAL_TABLET | Freq: Three times a day (TID) | ORAL | Status: DC

## 2018-03-12 MED ORDER — LIDOCAINE 5 % EX PTCH: 1.0000 | MEDICATED_PATCH | CUTANEOUS | 0 refills | Status: DC

## 2018-03-12 MED ORDER — SACCHAROMYCES BOULARDII 250 MG PO CAPS: 250.0000 mg | ORAL_CAPSULE | Freq: Two times a day (BID) | ORAL | 0 refills | Status: DC

## 2018-03-12 MED ORDER — HYDROXYZINE HCL 25 MG PO TABS: 25.0000 mg | ORAL_TABLET | Freq: Every evening | ORAL | 0 refills | Status: DC | PRN

## 2018-03-12 NOTE — Discharge Summary (Signed)
Physician Discharge Summary  [name] ZES:[id number] DOB: June 03, 1932 DOA: 03/08/2018  PCP: Kathyrn Drown, MD  Admit date: 03/08/2018 Discharge date: 03/12/2018  Admitted From: Home Disposition: Skilled nursing facility  Recommendations for Outpatient Follow-up:  1. Follow up with PCP once released from skilled nursing facility  Discharge Condition: Stable CODE STATUS: DNR Diet recommendation: Heart healthy, carb modified  Brief/Interim Summary: 84 year old female with a history of hypertension diabetes, recent admission for C. difficile diarrhea and was discharged home with oral vancomycin, return to the hospital with worsening diarrhea, dehydration and hyponatremia.  She had significant nausea and vomiting.  She was admitted to the hospital and started on IV fluids.  She was continued on oral vancomycin.  With supportive management, her overall condition has improved.  Stool frequency is greatly reduced and consistency is more formed.  Her hyponatremia and dehydration have resolved with IV fluids.  Her nausea and vomiting have resolved and she is tolerating a solid diet.  She was seen by physical therapy with recommendations for skilled nursing facility placement.  The remainder of her problems remained stable she was continued on her regular medicines.  She will be discharged to skilled care facility today.  Discharge Diagnoses:  Active Problems:   Controlled type 2 diabetes mellitus without complication, without long-term current use of insulin (HCC)   HLD (hyperlipidemia)   Essential hypertension   C. difficile diarrhea   Vomiting and diarrhea   Dehydration   Hyponatremia    Discharge Instructions  Discharge Instructions    Diet - low sodium heart healthy   Complete by:  As directed    Increase activity slowly   Complete by:  As directed      Allergies as of 03/12/2018      Reactions   Phenergan [promethazine Hcl] Other (See Comments)   hallucinations   Statins  Other (See Comments)   Severe myalgias   Trazodone And Nefazodone Cough   Prednisone Rash      Medication List    TAKE these medications   acetaminophen 500 MG tablet Commonly known as:  TYLENOL Take 500-1,000 mg by mouth every 6 (six) hours as needed for mild pain or moderate pain.   amLODipine 10 MG tablet Commonly known as:  NORVASC Take one tablet daily What changed:    how much to take  how to take this  when to take this  additional instructions   beta carotene w/minerals tablet Take 1 tablet by mouth daily.   Cyanocobalamin 2500 MCG Tabs Take 2,500 mcg by mouth daily.   feeding supplement (GLUCERNA SHAKE) Liqd Take 237 mLs by mouth 3 (three) times daily between meals.   hydrALAZINE 10 MG tablet Commonly known as:  APRESOLINE Take 1 tablet (10 mg total) by mouth 3 (three) times daily.   hydrOXYzine 25 MG tablet Commonly known as:  ATARAX/VISTARIL Take 1 tablet (25 mg total) by mouth at bedtime as needed (sleep).   indapamide 2.5 MG tablet Commonly known as:  LOZOL 1 qam What changed:    how much to take  how to take this  when to take this  additional instructions   lidocaine 5 % Commonly known as:  LIDODERM Place 1 patch onto the skin daily. Remove & Discard patch within 12 hours or as directed by MD   losartan 100 MG tablet Commonly known as:  COZAAR TAKE 1 TABLET BY MOUTH ONCE DAILY   metFORMIN 1000 MG tablet Commonly known as:  GLUCOPHAGE TAKE 1 TABLET BY  MOUTH TWICE DAILY   ondansetron 8 MG disintegrating tablet Commonly known as:  ZOFRAN-ODT Take one three times daily as needed for nausea   polyvinyl alcohol 1.4 % ophthalmic solution Commonly known as:  LIQUIFILM TEARS Place 1 drop into both eyes 3 (three) times daily.   saccharomyces boulardii 250 MG capsule Commonly known as:  FLORASTOR Take 1 capsule (250 mg total) by mouth 2 (two) times daily.   vancomycin 50 mg/mL  oral solution Commonly known as:  VANCOCIN Take 2.5  mLs (125 mg total) by mouth 4 (four) times daily for 9 days.       Allergies  Allergen Reactions  . Phenergan [Promethazine Hcl] Other (See Comments)    hallucinations  . Statins Other (See Comments)    Severe myalgias  . Trazodone And Nefazodone Cough  . Prednisone Rash    Consultations:     Procedures/Studies: Dg Chest 2 View  Result Date: 03/08/2018 CLINICAL DATA:  84 year old female with a history of weakness EXAM: CHEST - 2 VIEW COMPARISON:  01/27/2018, 05/13/2017 FINDINGS: Cardiomediastinal silhouette unchanged in size and contour. No pneumothorax. No pleural effusion. Coarsened interstitial markings, similar to the prior. No confluent airspace disease. No acute displaced fracture.  Degenerative changes of the spine. IMPRESSION: Chronic changes without evidence of acute cardiopulmonary disease Electronically Signed   By: Corrie Mckusick D.O.   On: 03/08/2018 10:50   Ct Abdomen Pelvis W Contrast  Result Date: 03/08/2018 CLINICAL DATA:  Worsening abdominal pain and diarrhea for several days. C difficile colitis. EXAM: CT ABDOMEN AND PELVIS WITH CONTRAST TECHNIQUE: Multidetector CT imaging of the abdomen and pelvis was performed using the standard protocol following bolus administration of intravenous contrast. CONTRAST:  135mL ISOVUE-300 IOPAMIDOL (ISOVUE-300) INJECTION 61% COMPARISON:  02/27/2018 FINDINGS: Lower Chest: No acute findings. Hepatobiliary: No hepatic masses identified. Multiple small calcified gallstones are seen, however there is no evidence of cholecystitis or biliary ductal dilatation. Pancreas:  No mass or inflammatory changes. Spleen: Within normal limits in size and appearance. Adrenals/Urinary Tract: No masses identified. No evidence of hydronephrosis. Stomach/Bowel: No evidence of obstruction, inflammatory process or abnormal fluid collections. Vascular/Lymphatic: No pathologically enlarged lymph nodes. No abdominal aortic aneurysm. Aortic atherosclerosis.  Reproductive: Prior hysterectomy noted. Adnexal regions are unremarkable in appearance. Other: Surgical mesh is seen within the anterior abdominal wall, without evidence of recurrent hernia. Musculoskeletal:  No suspicious bone lesions identified. IMPRESSION: No radiographic evidence of colitis or other acute findings. Cholelithiasis.  No radiographic evidence of cholecystitis. Electronically Signed   By: Earle Gell M.D.   On: 03/08/2018 11:37   Ct Abdomen Pelvis W Contrast  Result Date: 02/27/2018 CLINICAL DATA:  Abdominal distention. EXAM: CT ABDOMEN AND PELVIS WITH CONTRAST TECHNIQUE: Multidetector CT imaging of the abdomen and pelvis was performed using the standard protocol following bolus administration of intravenous contrast. CONTRAST:  129mL ISOVUE-300 IOPAMIDOL (ISOVUE-300) INJECTION 61% COMPARISON:  07/02/2016 FINDINGS: Lower chest: Mild reticulation and right middle lobe scarring. No acute finding Hepatobiliary: No focal liver abnormality.Layering gallstones. No acute inflammation. Pancreas: Unremarkable. Spleen: Unremarkable. Adrenals/Urinary Tract: Negative adrenals. No hydronephrosis or stone. Unremarkable bladder. Stomach/Bowel: No obstruction. Appendectomy. No inflammatory changes. Vascular/Lymphatic: No acute vascular abnormality. Atherosclerotic calcification with at least moderate narrowing at the celiac origin. There is good patency of the proximal SMA. No mass or adenopathy. Reproductive:Hysterectomy with a degree of pelvic floor laxity. If remaining, negative adnexae. Other: No ascites or pneumoperitoneum. Ventral hernia repair using mesh Musculoskeletal: No acute abnormalities. Scoliosis and lumbar dextrocurvature. IMPRESSION: 1. No acute  finding. 2. Cholelithiasis. Electronically Signed   By: Monte Fantasia M.D.   On: 02/27/2018 09:48       Subjective: Feeling better.  Has had 2 bowel movements which more formed.  No nausea or vomiting.  Discharge Exam: Vitals:   03/11/18  0532 03/11/18 1325 03/11/18 2200 03/12/18 0535  BP: (!) 144/58 (!) 135/56 (!) 151/79 (!) 158/61  Pulse: (!) 58 65 62 74  Resp: 16 18 18 20   Temp: 98.1 F (36.7 C) 98.2 F (36.8 C) 97.7 F (36.5 C) 97.8 F (36.6 C)  TempSrc: Oral Oral Oral Oral  SpO2: 98% 100% 97% 95%  Weight:      Height:        General: Pt is alert, awake, not in acute distress Cardiovascular: RRR, S1/S2 +, no rubs, no gallops Respiratory: CTA bilaterally, no wheezing, no rhonchi Abdominal: Soft, NT, ND, bowel sounds + Extremities: no edema, no cyanosis    The results of significant diagnostics from this hospitalization (including imaging, microbiology, ancillary and laboratory) are listed below for reference.     Microbiology: Recent Results (from the past 240 hour(s))  Urine culture     Status: Abnormal   Collection Time: 03/02/18 11:31 PM  Result Value Ref Range Status   Specimen Description   Final    URINE, RANDOM Performed at [facility] [address], [place], [place] [address]    Special Requests   Final    NONE Performed at [facility], [place] [address] [place], [place] [address]    Culture >=100,000 COLONIES/mL ESCHERICHIA COLI (A)  Final   Report Status 03/05/2018 FINAL  Final   Organism ID, Bacteria ESCHERICHIA COLI (A)  Final      Susceptibility   Escherichia coli - MIC*    AMPICILLIN >=32 RESISTANT Resistant     CEFAZOLIN 8 SENSITIVE Sensitive     CEFTRIAXONE <=1 SENSITIVE Sensitive     CIPROFLOXACIN >=4 RESISTANT Resistant     GENTAMICIN <=1 SENSITIVE Sensitive     IMIPENEM <=0.25 SENSITIVE Sensitive     NITROFURANTOIN 32 SENSITIVE Sensitive     TRIMETH/SULFA <=20 SENSITIVE Sensitive     AMPICILLIN/SULBACTAM >=32 RESISTANT Resistant     PIP/TAZO <=4 SENSITIVE Sensitive     Extended ESBL NEGATIVE Sensitive     * >=100,000 COLONIES/mL ESCHERICHIA COLI  Urine culture     Status: Abnormal   Collection Time: 03/08/18 10:09 AM  Result Value  Ref Range Status   Specimen Description   Final    URINE, CLEAN CATCH Performed at [facility], [address] [place], [place] [address]    Special Requests   Final    NONE Performed at [facility], [address], [place], [place] [address]    Culture (A)  Final    <10,000 COLONIES/mL INSIGNIFICANT GROWTH Performed at [facility] [address] [place],  [address]    Report Status 03/09/2018 FINAL  Final     Labs: BNP (last 3 results) No results for input(s): BNP in the last 8760 hours. Basic Metabolic Panel: Recent Labs  Lab 03/08/18 0906 03/09/18 0515 03/10/18 0546  NA 128* 134* 139  K 3.5 4.2 4.0  CL 90* 99 105  CO2 25 28 29   GLUCOSE 219* 115* 122*  BUN 14 10 10   CREATININE 0.81 0.71 0.72  CALCIUM 9.5 8.9 9.0   Liver Function Tests: Recent Labs  Lab 03/08/18 0906 03/09/18 0515  AST 22 13*  ALT 15  10  ALKPHOS 60 51  BILITOT 1.1 0.9  PROT 6.9 6.1*  ALBUMIN 4.0 3.4*   Recent Labs  Lab 03/08/18 0906  LIPASE 32   No results for input(s): AMMONIA in the last 168 hours. CBC: Recent Labs  Lab 03/08/18 0906 03/09/18 0515  WBC 7.3 6.6  NEUTROABS 5.3  --   HGB 12.2 11.3*  HCT 36.1 35.0*  MCV 83.6 85.8  PLT 296 285   Cardiac Enzymes: Recent Labs  Lab 03/08/18 0906  TROPONINI <0.03   BNP: Invalid input(s): POCBNP CBG: Recent Labs  Lab 03/11/18 1151 03/11/18 1643 03/11/18 2106 03/12/18 0747 03/12/18 1135  GLUCAP 95 128* 113* 115* 137*   D-Dimer No results for input(s): DDIMER in the last 72 hours. Hgb A1c No results for input(s): HGBA1C in the last 72 hours. Lipid Profile No results for input(s): CHOL, HDL, LDLCALC, TRIG, CHOLHDL, LDLDIRECT in the last 72 hours. Thyroid function studies No results for input(s): TSH, T4TOTAL, T3FREE, THYROIDAB in the last 72 hours.  Invalid input(s): FREET3 Anemia work up No results for input(s): VITAMINB12, FOLATE, FERRITIN, TIBC, IRON, RETICCTPCT in the last 72 hours. Urinalysis     Component Value Date/Time   COLORURINE YELLOW 03/08/2018 1009   APPEARANCEUR CLEAR 03/08/2018 1009   LABSPEC 1.006 03/08/2018 1009   PHURINE 7.0 03/08/2018 1009   GLUCOSEU NEGATIVE 03/08/2018 1009   HGBUR NEGATIVE 03/08/2018 1009   [place] 03/08/2018 1009   [place] 03/08/2018 1009   PROTEINUR NEGATIVE 03/08/2018 1009   UROBILINOGEN 0.2 12/15/2014 1956   NITRITE NEGATIVE 03/08/2018 1009   LEUKOCYTESUR NEGATIVE 03/08/2018 1009   Sepsis Labs Invalid input(s): PROCALCITONIN,  WBC,  LACTICIDVEN Microbiology Recent Results (from the past 240 hour(s))  Urine culture     Status: Abnormal   Collection Time: 03/02/18 11:31 PM  Result Value Ref Range Status   Specimen Description   Final    URINE, RANDOM Performed at [facility], [place] [address], [place], [place] [address]    Special Requests   Final    NONE Performed at [facility], [place] [address], [place], [place] [address]    Culture >=100,000 COLONIES/mL ESCHERICHIA COLI (A)  Final   Report Status 03/05/2018 FINAL  Final   Organism ID, Bacteria ESCHERICHIA COLI (A)  Final      Susceptibility   Escherichia coli - MIC*    AMPICILLIN >=32 RESISTANT Resistant     CEFAZOLIN 8 SENSITIVE Sensitive     CEFTRIAXONE <=1 SENSITIVE Sensitive     CIPROFLOXACIN >=4 RESISTANT Resistant     GENTAMICIN <=1 SENSITIVE Sensitive     IMIPENEM <=0.25 SENSITIVE Sensitive     NITROFURANTOIN 32 SENSITIVE Sensitive     TRIMETH/SULFA <=20 SENSITIVE Sensitive     AMPICILLIN/SULBACTAM >=32 RESISTANT Resistant     PIP/TAZO <=4 SENSITIVE Sensitive     Extended ESBL NEGATIVE Sensitive     * >=100,000 COLONIES/mL ESCHERICHIA COLI  Urine culture     Status: Abnormal   Collection Time: 03/08/18 10:09 AM  Result Value Ref Range Status   Specimen Description   Final    URINE, CLEAN CATCH Performed at [facility], [address], [place], [place] [address]    Special Requests   Final     NONE Performed at [facility], [address], [place], [place] [address]    Culture (A)  Final    <10,000 COLONIES/mL INSIGNIFICANT GROWTH Performed at [facility] [address] [place], [place]  [address]    Report Status 03/09/2018 FINAL  Final     Time coordinating discharge: 108mins  SIGNED:   Kathie Dike, MD  [facility] 03/12/2018, 12:17 PM Pager   If 7PM-7AM, please contact night-coverage [email or web address] Password [email or web address]

## 2018-03-12 NOTE — Progress Notes (Signed)
Patient discharged home today per MD orders. Patient vital signs WDL. IV removed and site WDL. Discharge Instructions including follow up appointments, medications, and education reviewed with patient. Patient verbalizes understanding. Patient is transported out via wheelchair.  

## 2018-03-12 NOTE — NC FL2 (Signed)
[place] LEVEL OF CARE SCREENING TOOL     IDENTIFICATION  Patient Name: [name] Birthdate: Aug 01, 1931 Sex: female Admission Date (Current Location): 03/08/2018  [facility] and [place] Number:  [employer] and Address:  [place] [address] [place]      Provider Number: [id number]  Attending Physician Name and Address:  Kathie Dike, MD  Relative Name and Phone Number:       Current Level of Care: Other (Comment)(OBSERVATION) Recommended Level of Care: [place] Prior Approval Number:    Date Approved/Denied:   PASRR Number: [id number])  Discharge Plan: SNF    Current Diagnoses: Patient Active Problem List   Diagnosis Date Noted  . Vomiting and diarrhea 03/08/2018  . Dehydration 03/08/2018  . Hyponatremia 03/08/2018  . C. difficile diarrhea 03/03/2018  . Acute lower UTI 03/03/2018  . Hyperlipidemia associated with type 2 diabetes mellitus ([place]) 10/02/2016  . Pedal edema 10/02/2016  . Insomnia 10/02/2016  . SBO (small bowel obstruction) ([place]) 07/02/2016  . Dyspnea on exertion 02/24/2016  . Other fatigue   . E-coli UTI 06/18/2015  . Partial small bowel obstruction ([place]) 06/15/2015  . Ileitis, regional, with intestinal obstruction 06/15/2015  . Leukocytosis 12/16/2014  . Nausea and vomiting 12/15/2014  . HLD (hyperlipidemia) 12/15/2014  . Essential hypertension 12/15/2014  . Controlled type 2 diabetes mellitus without complication, without long-term current use of insulin ([place]) 02/15/2011    Orientation RESPIRATION BLADDER Height & Weight     Self, Time, Situation, Place  Normal Continent Weight: 160 lb (72.6 kg) Height:  5\' 8"  (172.7 cm)  BEHAVIORAL SYMPTOMS/MOOD NEUROLOGICAL BOWEL NUTRITION STATUS      Continent (Soft )  AMBULATORY STATUS COMMUNICATION OF NEEDS Skin   Limited Assist Verbally Normal                       Personal Care Assistance Level of  Assistance  Feeding, Dressing, Bathing Bathing Assistance: Limited assistance Feeding assistance: Independent Dressing Assistance: Limited assistance     Functional Limitations Info  Sight, Hearing, Speech Sight Info: Adequate Hearing Info: Adequate Speech Info: Adequate    SPECIAL CARE FACTORS FREQUENCY  PT (By licensed PT)     PT Frequency: 5x/week              Contractures Contractures Info: Not present    Additional Factors Info  Code Status, Allergies, Isolation Precautions Code Status Info: DNR Allergies Info: Phenergan, Statins, Trazodone, and Nefazodone, Prednisone      Isolation Precautions Info: Enteric precautions      Current Medications (03/12/2018):  This is the current hospital active medication list Current Facility-Administered Medications  Medication Dose Route Frequency Provider Last Rate Last Dose  . acetaminophen (TYLENOL) tablet 650 mg  650 mg Oral Q6H PRN Kathie Dike, MD       Or  . acetaminophen (TYLENOL) suppository 650 mg  650 mg Rectal Q6H PRN Kathie Dike, MD      . amLODipine (NORVASC) tablet 10 mg  10 mg Oral Daily Kathie Dike, MD   10 mg at 03/12/18 0832  . enoxaparin (LOVENOX) injection 40 mg  40 mg Subcutaneous Q24H Kathie Dike, MD   40 mg at 03/11/18 1756  . hydrALAZINE (APRESOLINE) tablet 10 mg  10 mg Oral TID Kathie Dike, MD   10 mg at 03/12/18 0831  . hydrOXYzine (ATARAX/VISTARIL) tablet 25 mg  25 mg Oral QHS PRN Kathie Dike, MD  25 mg at 03/11/18 2221  . insulin aspart (novoLOG) injection 0-5 Units  0-5 Units Subcutaneous QHS Memon, Jolaine Artist, MD      . insulin aspart (novoLOG) injection 0-9 Units  0-9 Units Subcutaneous TID WC Kathie Dike, MD   1 Units at 03/12/18 1143  . lidocaine (LIDODERM) 5 % 1 patch  1 patch Transdermal Q24H Kathie Dike, MD   1 patch at 03/11/18 1755  . losartan (COZAAR) tablet 100 mg  100 mg Oral Daily Kathie Dike, MD   100 mg at 03/12/18 0831  . ondansetron (ZOFRAN) tablet  4 mg  4 mg Oral Q6H PRN Kathie Dike, MD       Or  . ondansetron (ZOFRAN) injection 4 mg  4 mg Intravenous Q6H PRN Kathie Dike, MD      . polyvinyl alcohol (LIQUIFILM TEARS) 1.4 % ophthalmic solution 1 drop  1 drop Both Eyes PRN Kathie Dike, MD   1 drop at 03/11/18 2215  . vancomycin (VANCOCIN) 50 mg/mL oral solution 125 mg  125 mg Oral QID Kathie Dike, MD   125 mg at 03/12/18 1357     Discharge Medications: Please see discharge summary for a list of discharge medications.  Relevant Imaging Results:  Relevant Lab Results:   Additional Information SSN: [id number].  Patient is in observation and will be private pay.   Randie Bloodgood, Clydene Pugh, LCSW

## 2018-03-12 NOTE — Progress Notes (Addendum)
Report called to Gerald Stabs, Therapist, sports at [facility] Admission coordinator from [facility] called and said that patient cant come there due to Social work not having completed their portion. Will touch base with Nira Conn, Education officer, museum.

## 2018-03-12 NOTE — Clinical Social Work Note (Signed)
Patient advised that the daily rate for [facility] is 375/day and they require 5250 for two weeks upfront. Patient is agreeable.     Linden Mikes, Clydene Pugh, LCSW

## 2018-03-12 NOTE — Clinical Social Work Placement (Signed)
   CLINICAL SOCIAL WORK PLACEMENT  NOTE  Date:  03/12/2018  Patient Details  Name: [name] MRN: [id number] Date of Birth: 1931-08-20  Clinical Social Work is seeking post-discharge placement for this patient at the [place] level of care (*CSW will initial, date and re-position this form in  chart as items are completed):  Yes   Patient/family provided with [place] Work Department's list of facilities offering this level of care within the geographic area requested by the patient (or if unable, by the patient's family).  Yes   Patient/family informed of their freedom to choose among providers that offer the needed level of care, that participate in Medicare, Medicaid or managed care program needed by the patient, have an available bed and are willing to accept the patient.  Yes   Patient/family informed of [facility]'s ownership interest in [facility] and [facility], as well as of the fact that they are under no obligation to receive care at these facilities.  PASRR submitted to EDS on 03/09/18     PASRR number received on 03/09/18     Existing PASRR number confirmed on       FL2 transmitted to all facilities in geographic area requested by pt/family on 03/09/18     FL2 transmitted to all facilities within larger geographic area on       Patient informed that his/her managed care company has contracts with or will negotiate with certain facilities, including the following:        Yes   Patient/family informed of bed offers received.  Patient chooses bed at [facility]     Physician recommends and patient chooses bed at      Patient to be transferred to [facility] on 03/12/18.  Patient to be transferred to facility by son     Patient family notified on 03/12/18 of transfer.  Name of family member notified:  son     PHYSICIAN       Additional Comment:  Discharge clinicals sent via [place].   _______________________________________________ Ihor Gully, LCSW 03/12/2018, 2:11 PM

## 2018-03-13 DIAGNOSIS — R262 Difficulty in walking, not elsewhere classified: Secondary | ICD-10-CM | POA: Diagnosis not present

## 2018-03-13 DIAGNOSIS — M6281 Muscle weakness (generalized): Secondary | ICD-10-CM | POA: Diagnosis not present

## 2018-03-15 ENCOUNTER — Telehealth: Payer: Self-pay | Admitting: Family Medicine

## 2018-03-15 NOTE — Telephone Encounter (Signed)
Nurses-this patient was in the hospital Just recently released  The nursing home currently Please try to track down which nursing home that she currently?  Thank you

## 2018-03-16 DIAGNOSIS — R262 Difficulty in walking, not elsewhere classified: Secondary | ICD-10-CM | POA: Diagnosis not present

## 2018-03-16 DIAGNOSIS — M6281 Muscle weakness (generalized): Secondary | ICD-10-CM | POA: Diagnosis not present

## 2018-03-16 NOTE — Telephone Encounter (Signed)
Patient was transferred to [facility] in [place]

## 2018-03-17 DIAGNOSIS — M6281 Muscle weakness (generalized): Secondary | ICD-10-CM | POA: Diagnosis not present

## 2018-03-17 DIAGNOSIS — Z79899 Other long term (current) drug therapy: Secondary | ICD-10-CM | POA: Diagnosis not present

## 2018-03-17 DIAGNOSIS — R262 Difficulty in walking, not elsewhere classified: Secondary | ICD-10-CM | POA: Diagnosis not present

## 2018-03-18 DIAGNOSIS — M6281 Muscle weakness (generalized): Secondary | ICD-10-CM | POA: Diagnosis not present

## 2018-03-18 DIAGNOSIS — R262 Difficulty in walking, not elsewhere classified: Secondary | ICD-10-CM | POA: Diagnosis not present

## 2018-03-19 DIAGNOSIS — M6281 Muscle weakness (generalized): Secondary | ICD-10-CM | POA: Diagnosis not present

## 2018-03-19 DIAGNOSIS — R262 Difficulty in walking, not elsewhere classified: Secondary | ICD-10-CM | POA: Diagnosis not present

## 2018-03-20 DIAGNOSIS — R262 Difficulty in walking, not elsewhere classified: Secondary | ICD-10-CM | POA: Diagnosis not present

## 2018-03-20 DIAGNOSIS — R319 Hematuria, unspecified: Secondary | ICD-10-CM | POA: Diagnosis not present

## 2018-03-20 DIAGNOSIS — Z79899 Other long term (current) drug therapy: Secondary | ICD-10-CM | POA: Diagnosis not present

## 2018-03-20 DIAGNOSIS — M6281 Muscle weakness (generalized): Secondary | ICD-10-CM | POA: Diagnosis not present

## 2018-03-23 DIAGNOSIS — I10 Essential (primary) hypertension: Secondary | ICD-10-CM | POA: Diagnosis not present

## 2018-03-23 DIAGNOSIS — Z789 Other specified health status: Secondary | ICD-10-CM | POA: Insufficient documentation

## 2018-03-23 DIAGNOSIS — Z593 Problems related to living in residential institution: Secondary | ICD-10-CM | POA: Diagnosis not present

## 2018-03-23 DIAGNOSIS — H43393 Other vitreous opacities, bilateral: Secondary | ICD-10-CM | POA: Diagnosis not present

## 2018-03-23 DIAGNOSIS — E871 Hypo-osmolality and hyponatremia: Secondary | ICD-10-CM | POA: Diagnosis not present

## 2018-03-23 DIAGNOSIS — A0472 Enterocolitis due to Clostridium difficile, not specified as recurrent: Secondary | ICD-10-CM | POA: Diagnosis not present

## 2018-03-23 DIAGNOSIS — E119 Type 2 diabetes mellitus without complications: Secondary | ICD-10-CM | POA: Diagnosis not present

## 2018-03-24 ENCOUNTER — Ambulatory Visit: Payer: Medicare Other | Admitting: Family Medicine

## 2018-03-24 NOTE — Telephone Encounter (Signed)
Note -thank you

## 2018-03-25 DIAGNOSIS — D649 Anemia, unspecified: Secondary | ICD-10-CM | POA: Diagnosis not present

## 2018-03-25 DIAGNOSIS — Z79899 Other long term (current) drug therapy: Secondary | ICD-10-CM | POA: Diagnosis not present

## 2018-03-31 DIAGNOSIS — H353110 Nonexudative age-related macular degeneration, right eye, stage unspecified: Secondary | ICD-10-CM | POA: Diagnosis not present

## 2018-04-17 ENCOUNTER — Telehealth: Payer: Self-pay | Admitting: Family Medicine

## 2018-04-17 NOTE — Telephone Encounter (Signed)
A long term care form was faxed over on patient but last seen here was 02/26/18. Please advise. In your box

## 2018-04-19 NOTE — Telephone Encounter (Signed)
This form that is being requested of [place] would be very difficult for [place] to do because we have not seen the patient since she was in the hospital. I do not doubt that she requires nursing home care Please gently discuss this with the caretakers to find out some details on how she is doing currently Is she in assisted living or if she in a nursing home currently? I would imagine she has a caretaker-medical provider at that assisted living? If so it would be most appropriate for them to help fill this form out (I certainly do not want to leave them hanging if at all possible thank you)

## 2018-04-25 IMAGING — DX DG CHEST 2V
2 series · 2 of 2 positions shown · non-contrast
Comparison: Chest radiograph dated 02/24/2016

CLINICAL DATA: 85-year-old female with palpitation.

EXAM:
CHEST  2 VIEW

[chest lat]
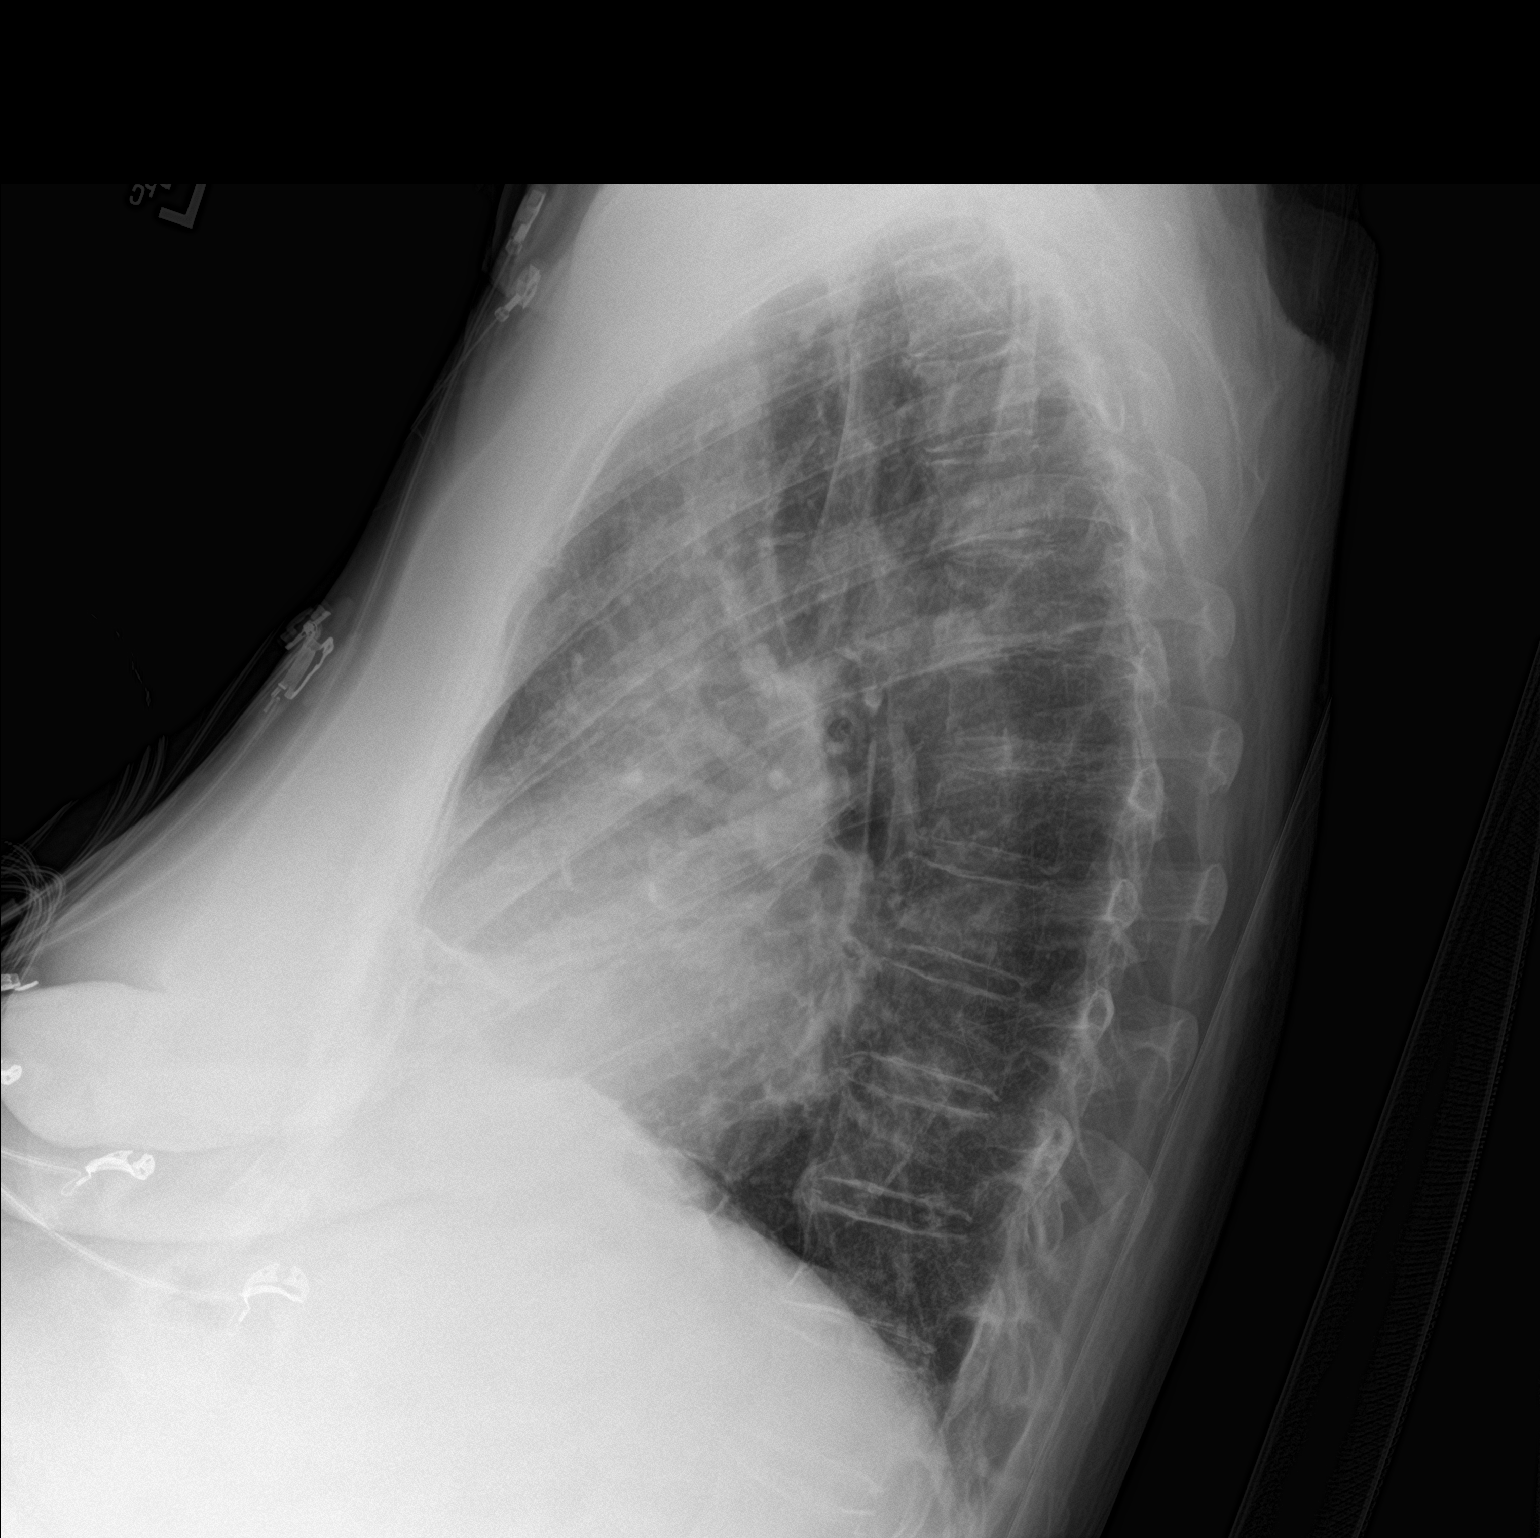

[chest ap]
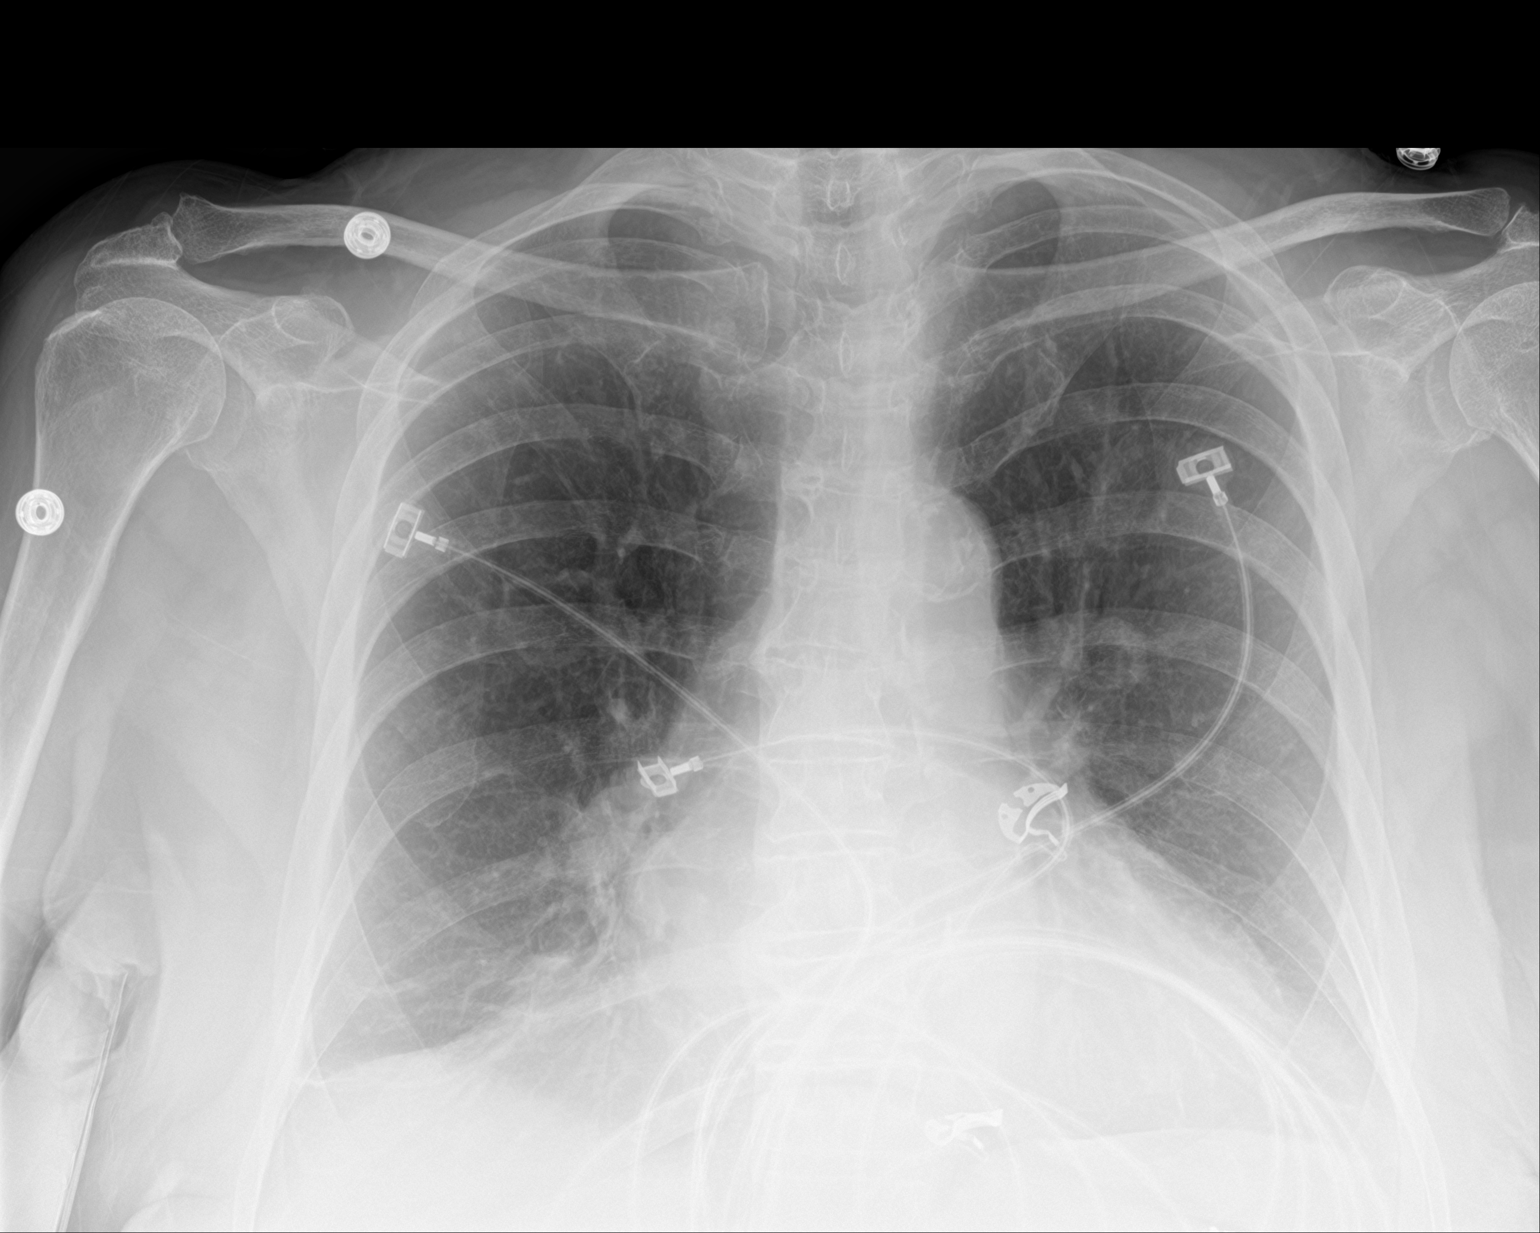

[2 of 2 positions shown; findings below may reference images not displayed]

FINDINGS: There are bibasilar linear atelectasis/scarring. There is no focal
consolidation, pleural effusion, or pneumothorax. Stable cardiac
silhouette. There is atherosclerotic calcification of the aortic
arch. No acute osseous pathology.
IMPRESSION: No active cardiopulmonary disease.

## 2018-05-07 ENCOUNTER — Encounter: Payer: Self-pay | Admitting: Family Medicine

## 2018-05-07 ENCOUNTER — Ambulatory Visit (INDEPENDENT_AMBULATORY_CARE_PROVIDER_SITE_OTHER): Payer: Medicare Other | Admitting: Family Medicine

## 2018-05-07 VITALS — BP 128/60 | Ht 68.0 in | Wt 158.0 lb

## 2018-05-07 DIAGNOSIS — E119 Type 2 diabetes mellitus without complications: Secondary | ICD-10-CM | POA: Diagnosis not present

## 2018-05-07 DIAGNOSIS — R195 Other fecal abnormalities: Secondary | ICD-10-CM | POA: Diagnosis not present

## 2018-05-07 DIAGNOSIS — I10 Essential (primary) hypertension: Secondary | ICD-10-CM

## 2018-05-07 LAB — POCT GLYCOSYLATED HEMOGLOBIN (HGB A1C): Hemoglobin A1C: 6.2 % — AB (ref 4.0–5.6)

## 2018-05-07 MED ORDER — AMLODIPINE BESYLATE 5 MG PO TABS: 5.0000 mg | ORAL_TABLET | Freq: Every day | ORAL | 1 refills | Status: DC

## 2018-05-07 MED ORDER — HYDRALAZINE HCL 10 MG PO TABS: ORAL_TABLET | ORAL | 1 refills | Status: DC

## 2018-05-07 MED ORDER — INDAPAMIDE 1.25 MG PO TABS: ORAL_TABLET | ORAL | 1 refills | Status: DC

## 2018-05-07 NOTE — Progress Notes (Signed)
   Subjective:    Patient ID: [name], female    DOB: 1931/12/22, 84 y.o.   MRN: [id number]  HPI  Patient is here today to follow up on chronic health issues.She is diabetic and takes metformin 1000 mb bid. This patient was in the hospital because of severe C. difficile toxin infection she was treated with oral antibiotics then with vancomycin she became very malnourished and had to go into rehab in order to build herself back up she is here today to repeat establish herself Has htn and takes Norvasc 10 mg one per day, Losartan 100 mg daily, Lozol 2.5 mg one daily, Hydralazine 10 mg TID. She eats healthy and exercises.   She has had a bout of cidiff and was placed on vancomycin for 14 days.  Review of Systems  Constitutional: Negative for activity change, appetite change and fatigue.  HENT: Negative for congestion and rhinorrhea.   Respiratory: Negative for cough and shortness of breath.   Cardiovascular: Negative for chest pain and leg swelling.  Gastrointestinal: Negative for abdominal pain and diarrhea.  Endocrine: Negative for polydipsia and polyphagia.  Skin: Negative for color change.  Neurological: Negative for dizziness and weakness.  Psychiatric/Behavioral: Negative for behavioral problems and confusion.   Results for orders placed or performed in visit on 05/07/18  POCT glycosylated hemoglobin (Hb A1C)  Result Value Ref Range   Hemoglobin A1C 6.2 (A) 4.0 - 5.6 %   HbA1c POC (<> result, manual entry)     HbA1c, POC (prediabetic range)     HbA1c, POC (controlled diabetic range)         Objective:   Physical Exam  Constitutional: She appears well-nourished. No distress.  HENT:  Head: Normocephalic and atraumatic.  Eyes: Right eye exhibits no discharge. Left eye exhibits no discharge.  Neck: No tracheal deviation present.  Cardiovascular: Normal rate, regular rhythm and normal heart sounds.  No murmur heard. Pulmonary/Chest: Effort normal and breath sounds  normal. No respiratory distress.  Musculoskeletal: She exhibits no edema.  Lymphadenopathy:    She has no cervical adenopathy.  Neurological: She is alert. Coordination normal.  Skin: Skin is warm and dry.  Psychiatric: She has a normal mood and affect. Her behavior is normal.  Vitals reviewed.         Assessment & Plan:  Reestablishing the patient after a long hospitalization and recuperation in a rest home  Patient still significant weakness and we have talked at length about nutritional aspects  Had C. difficile still having loose stools we will recheck a C. difficile PCR hopefully the patient will not need another round of vancomycin  Continue probiotic on a daily basis  Hypertension patient on medication dosing that was appropriate for her before the hospitalization now that she is lost some weight she does not need as much medicine we will reduce the amlodipine new dose 5 mg, hydralazine new dose twice daily instead of 3 times daily, indapamide new dose 1.25 mg  Follow-up patient in several weeks recheck lab work recheck blood pressure on follow-up  25 minutes was spent with the patient.  This statement verifies that 25 minutes was indeed spent with the patient.  More than 50% of this visit-total duration of the visit-was spent in counseling and coordination of care. The issues that the patient came in for today as reflected in the diagnosis (s) please refer to documentation for further details.  Diabetes under good control but potentially reduce the metformin await the met 7

## 2018-05-08 ENCOUNTER — Other Ambulatory Visit: Payer: Self-pay

## 2018-05-08 ENCOUNTER — Other Ambulatory Visit: Payer: Self-pay | Admitting: Family Medicine

## 2018-05-08 LAB — CBC WITH DIFFERENTIAL/PLATELET
Basophils Absolute: 0 10*3/uL (ref 0.0–0.2)
Basos: 0 %
EOS (ABSOLUTE): 0.1 10*3/uL (ref 0.0–0.4)
Eos: 1 %
Hematocrit: 39.5 % (ref 34.0–46.6)
Hemoglobin: 12.9 g/dL (ref 11.1–15.9)
Immature Grans (Abs): 0.1 10*3/uL (ref 0.0–0.1)
Immature Granulocytes: 1 %
LYMPHS ABS: 2.7 10*3/uL (ref 0.7–3.1)
Lymphs: 27 %
MCH: 27.9 pg (ref 26.6–33.0)
MCHC: 32.7 g/dL (ref 31.5–35.7)
MCV: 86 fL (ref 79–97)
MONOS ABS: 0.7 10*3/uL (ref 0.1–0.9)
Monocytes: 7 %
NEUTROS PCT: 64 %
Neutrophils Absolute: 6.3 10*3/uL (ref 1.4–7.0)
PLATELETS: 360 10*3/uL (ref 150–450)
RBC: 4.62 x10E6/uL (ref 3.77–5.28)
RDW: 14 % (ref 12.3–15.4)
WBC: 9.9 10*3/uL (ref 3.4–10.8)

## 2018-05-08 LAB — COMPREHENSIVE METABOLIC PANEL
A/G RATIO: 2 (ref 1.2–2.2)
ALK PHOS: 73 IU/L (ref 39–117)
ALT: 10 IU/L (ref 0–32)
AST: 12 IU/L (ref 0–40)
Albumin: 4.4 g/dL (ref 3.5–4.7)
BUN/Creatinine Ratio: 22 (ref 12–28)
BUN: 20 mg/dL (ref 8–27)
Bilirubin Total: 0.6 mg/dL (ref 0.0–1.2)
CO2: 26 mmol/L (ref 20–29)
CREATININE: 0.91 mg/dL (ref 0.57–1.00)
Calcium: 9.9 mg/dL (ref 8.7–10.3)
Chloride: 94 mmol/L — ABNORMAL LOW (ref 96–106)
GFR calc Af Amer: 67 mL/min/{1.73_m2} (ref >59)
GFR calc non Af Amer: 58 mL/min/{1.73_m2} — ABNORMAL LOW (ref >59)
Globulin, Total: 2.2 g/dL (ref 1.5–4.5)
Glucose: 111 mg/dL — ABNORMAL HIGH (ref 65–99)
POTASSIUM: 4.6 mmol/L (ref 3.5–5.2)
SODIUM: 138 mmol/L (ref 134–144)
Total Protein: 6.6 g/dL (ref 6.0–8.5)

## 2018-05-08 MED ORDER — METFORMIN HCL 1000 MG PO TABS: ORAL_TABLET | ORAL | 1 refills | Status: DC

## 2018-05-08 NOTE — Addendum Note (Signed)
Addended by: Karle Barr on: 05/08/2018 09:12 AM   Modules accepted: Orders

## 2018-05-15 ENCOUNTER — Ambulatory Visit: Payer: Medicare Other | Admitting: Family Medicine

## 2018-05-19 ENCOUNTER — Telehealth: Payer: Self-pay

## 2018-05-19 ENCOUNTER — Other Ambulatory Visit: Payer: Self-pay

## 2018-05-19 ENCOUNTER — Encounter (HOSPITAL_COMMUNITY): Payer: Self-pay | Admitting: Emergency Medicine

## 2018-05-19 ENCOUNTER — Emergency Department (HOSPITAL_COMMUNITY)
Admission: EM | Admit: 2018-05-19 | Discharge: 2018-05-19 | Disposition: A | Discharge status: Home / Self Care | Payer: Medicare Other | Attending: Emergency Medicine | Admitting: Emergency Medicine

## 2018-05-19 DIAGNOSIS — R110 Nausea: Secondary | ICD-10-CM | POA: Insufficient documentation

## 2018-05-19 DIAGNOSIS — R5383 Other fatigue: Secondary | ICD-10-CM | POA: Diagnosis not present

## 2018-05-19 DIAGNOSIS — E119 Type 2 diabetes mellitus without complications: Secondary | ICD-10-CM | POA: Diagnosis not present

## 2018-05-19 DIAGNOSIS — Z79899 Other long term (current) drug therapy: Secondary | ICD-10-CM | POA: Insufficient documentation

## 2018-05-19 DIAGNOSIS — Z7984 Long term (current) use of oral hypoglycemic drugs: Secondary | ICD-10-CM | POA: Diagnosis not present

## 2018-05-19 DIAGNOSIS — I10 Essential (primary) hypertension: Secondary | ICD-10-CM | POA: Diagnosis not present

## 2018-05-19 DIAGNOSIS — R197 Diarrhea, unspecified: Secondary | ICD-10-CM | POA: Diagnosis not present

## 2018-05-19 DIAGNOSIS — R195 Other fecal abnormalities: Secondary | ICD-10-CM | POA: Diagnosis not present

## 2018-05-19 LAB — URINALYSIS, ROUTINE W REFLEX MICROSCOPIC
BACTERIA UA: NONE SEEN
BILIRUBIN URINE: NEGATIVE
Glucose, UA: NEGATIVE mg/dL
Hgb urine dipstick: NEGATIVE
KETONES UR: NEGATIVE mg/dL
Nitrite: NEGATIVE
Protein, ur: NEGATIVE mg/dL
Specific Gravity, Urine: 1.01 (ref 1.005–1.030)
pH: 7 (ref 5.0–8.0)

## 2018-05-19 LAB — COMPREHENSIVE METABOLIC PANEL
ALBUMIN: 4.2 g/dL (ref 3.5–5.0)
ALT: 14 U/L (ref 0–44)
AST: 18 U/L (ref 15–41)
Alkaline Phosphatase: 65 U/L (ref 38–126)
Anion gap: 10 (ref 5–15)
BUN: 15 mg/dL (ref 8–23)
CHLORIDE: 101 mmol/L (ref 98–111)
CO2: 28 mmol/L (ref 22–32)
CREATININE: 0.89 mg/dL (ref 0.44–1.00)
Calcium: 9.9 mg/dL (ref 8.9–10.3)
GFR calc Af Amer: >60 mL/min (ref >60)
GFR calc non Af Amer: 58 mL/min — ABNORMAL LOW (ref >60)
GLUCOSE: 152 mg/dL — AB (ref 70–99)
POTASSIUM: 4.1 mmol/L (ref 3.5–5.1)
SODIUM: 139 mmol/L (ref 135–145)
Total Bilirubin: 0.9 mg/dL (ref 0.3–1.2)
Total Protein: 7.4 g/dL (ref 6.5–8.1)

## 2018-05-19 LAB — CBC WITH DIFFERENTIAL/PLATELET
ABS IMMATURE GRANULOCYTES: 0.02 10*3/uL (ref 0.00–0.07)
Basophils Absolute: 0 10*3/uL (ref 0.0–0.1)
Basophils Relative: 0 %
Eosinophils Absolute: 0.1 10*3/uL (ref 0.0–0.5)
Eosinophils Relative: 1 %
HEMATOCRIT: 41.4 % (ref 36.0–46.0)
HEMOGLOBIN: 13 g/dL (ref 12.0–15.0)
Immature Granulocytes: 0 %
LYMPHS ABS: 1.9 10*3/uL (ref 0.7–4.0)
LYMPHS PCT: 19 %
MCH: 28 pg (ref 26.0–34.0)
MCHC: 31.4 g/dL (ref 30.0–36.0)
MCV: 89.2 fL (ref 80.0–100.0)
MONO ABS: 0.7 10*3/uL (ref 0.1–1.0)
MONOS PCT: 7 %
NEUTROS ABS: 7.1 10*3/uL (ref 1.7–7.7)
Neutrophils Relative %: 73 %
Platelets: 323 10*3/uL (ref 150–400)
RBC: 4.64 MIL/uL (ref 3.87–5.11)
RDW: 13.8 % (ref 11.5–15.5)
WBC: 9.8 10*3/uL (ref 4.0–10.5)
nRBC: 0 % (ref 0.0–0.2)

## 2018-05-19 MED ORDER — ONDANSETRON HCL 4 MG/2ML IJ SOLN
4.0000 mg | Freq: Once | INTRAMUSCULAR | Status: AC
  Administered 2018-05-19: 4 mg via INTRAVENOUS
  Filled 2018-05-19: qty 2

## 2018-05-19 MED ORDER — SODIUM CHLORIDE 0.9 % IV BOLUS
1000.0000 mL | Freq: Once | INTRAVENOUS | Status: AC
  Administered 2018-05-19: 1000 mL via INTRAVENOUS

## 2018-05-19 NOTE — Telephone Encounter (Signed)
Patient called today wanting to be seen.She says she has had cdiff and has an appt with [place] on 05/20/2018 for a follow up. She say she does not think she can wait to see [place] tomorrow as she is weak and nauseous no appetite, sweatingcan barely get up, no vomiting. She has had severe diarrhea since Sunday. She just feels awful.I advised her to go to the ed to be evaluated as she may need fluids since she has had the diarrhea.She states understanding.

## 2018-05-19 NOTE — ED Notes (Signed)
Pt unable to give stool sample at this time.

## 2018-05-19 NOTE — ED Provider Notes (Signed)
[facility] EMERGENCY DEPARTMENT Provider Note   CSN: [id number] Arrival date & time: 05/19/18  0175     History   Chief Complaint Chief Complaint  Patient presents with  . Diarrhea  . Nausea    HPI [name] is a 84 y.o. female presenting with generalized fatigue and not feeling well since yesterday.  She had a complicated history with a C. difficile infection having been admitted to the hospital in October for this problem, then was discharged to a nursing facility for rehab as she was significantly deconditioned once she was better from this infection.  She has been home since November 1 and was doing well, although reported having slightly increased looser stools over the past several days.  She endorses having to somewhat formed stools daily since her infection, but today also had a third stool which was much looser.  That in addition to general fatigue and reduced appetite for the past couple of days has her concern for possible recurrence of this infection.  She called her PCP this morning who advised her to collect a stool sample which she dropped off at [facility] prior to presenting here for further evaluation.  She has had no treatment for her diarrhea prior to arrival.  She has an appointment with her PCP tomorrow.  The history is provided by the patient.    Past Medical History:  Diagnosis Date  . Diabetes mellitus without complication ([place])   . Diverticulitis   . H/O bilateral breast reduction surgery   . Hyperlipidemia    a. intolerant to statins.   . Hypertension     Patient Active Problem List   Diagnosis Date Noted  . Vomiting and diarrhea 03/08/2018  . Dehydration 03/08/2018  . Hyponatremia 03/08/2018  . C. difficile diarrhea 03/03/2018  . Acute lower UTI 03/03/2018  . Hyperlipidemia associated with type 2 diabetes mellitus ([place]) 10/02/2016  . Pedal edema 10/02/2016  . Insomnia 10/02/2016  . SBO (small bowel obstruction) ([place]) 07/02/2016  . Dyspnea  on exertion 02/24/2016  . Other fatigue   . E-coli UTI 06/18/2015  . Partial small bowel obstruction ([place]) 06/15/2015  . Ileitis, regional, with intestinal obstruction 06/15/2015  . Leukocytosis 12/16/2014  . Nausea and vomiting 12/15/2014  . HLD (hyperlipidemia) 12/15/2014  . Essential hypertension 12/15/2014  . Controlled type 2 diabetes mellitus without complication, without long-term current use of insulin ([place]) 02/15/2011    Past Surgical History:  Procedure Laterality Date  . ABDOMINAL HYSTERECTOMY    . APPENDECTOMY    . BREAST REDUCTION SURGERY    . COLON SURGERY Left    Hemicolectomy due to diverticulitis  . EYE SURGERY  03/30/2009   cataract  . KNEE SURGERY    . LAPAROSCOPIC INCISIONAL / UMBILICAL / VENTRAL HERNIA REPAIR  02/23/2007   . REDUCTION MAMMAPLASTY Bilateral 2001  . REFRACTIVE SURGERY       OB History   None      Home Medications    Prior to Admission medications   Medication Sig Start Date End Date Taking? Authorizing Provider  acetaminophen (TYLENOL) 500 MG tablet Take 500-1,000 mg by mouth every 6 (six) hours as needed for mild pain or moderate pain.   Yes [provider]  amLODipine (NORVASC) 5 MG tablet Take 1 tablet (5 mg total) by mouth daily. 05/07/18  Yes Kathyrn Drown, MD  beta carotene w/minerals (OCUVITE) tablet Take 1 tablet by mouth daily.   Yes [provider]  Cyanocobalamin 2500 MCG  TABS Take 2,500 mcg by mouth daily.    Yes [provider]  hydrALAZINE (APRESOLINE) 10 MG tablet Take one po bid 05/07/18  Yes Luking, Scott A, MD  indapamide (LOZOL) 1.25 MG tablet Take one per day Patient taking differently: 1.25 mg daily. Take one per day 05/07/18  Yes Luking, Elayne Snare, MD  losartan (COZAAR) 100 MG tablet TAKE 1 TABLET BY MOUTH ONCE DAILY 11/27/17  Yes Kathyrn Drown, MD  metFORMIN (GLUCOPHAGE) 1000 MG tablet Take one tablet po in am,take 1/2 tablet at supper 05/08/18  Yes Luking, Scott A, MD  polyvinyl alcohol  (LIQUIFILM TEARS) 1.4 % ophthalmic solution Place 1 drop into both eyes 3 (three) times daily.    Yes [provider]  saccharomyces boulardii (FLORASTOR) 250 MG capsule Take 1 capsule (250 mg total) by mouth 2 (two) times daily. 03/12/18  Yes Kathie Dike, MD  sodium chloride 1 g tablet Take 1 g by mouth once. Once per day   Yes [provider]  feeding supplement, GLUCERNA SHAKE, (GLUCERNA SHAKE) LIQD Take 237 mLs by mouth 3 (three) times daily between meals. 03/05/18   Debbe Odea, MD  hydrOXYzine (ATARAX/VISTARIL) 25 MG tablet Take 1 tablet (25 mg total) by mouth at bedtime as needed (sleep). Patient not taking: Reported on 05/19/2018 03/12/18   Kathie Dike, MD  lidocaine (LIDODERM) 5 % Place 1 patch onto the skin daily. Remove & Discard patch within 12 hours or as directed by MD Patient not taking: Reported on 05/19/2018 03/12/18   Kathie Dike, MD  ondansetron (ZOFRAN ODT) 8 MG disintegrating tablet Take one three times daily as needed for nausea Patient not taking: Reported on 05/19/2018 03/06/18   Kathyrn Drown, MD    Family History Family History  Problem Relation Age of Onset  . Hypertension Father        kidney  . Stroke Father   . Cancer Father   . Cancer Mother        ovaian  . Diabetes Brother   . Hypertension Sister     Social History Social History   Tobacco Use  . Smoking status: Never Smoker  . Smokeless tobacco: Never Used  Substance Use Topics  . Alcohol use: No  . Drug use: No     Allergies   Lisinopril; Phenergan [promethazine hcl]; Statins; Trazodone and nefazodone; and Prednisone   Review of Systems Review of Systems  Constitutional: Positive for fatigue. Negative for chills and fever.  HENT: Negative.  Negative for congestion.   Eyes: Negative.   Respiratory: Negative for chest tightness and shortness of breath.   Cardiovascular: Negative for chest pain.  Gastrointestinal: Positive for diarrhea and nausea. Negative for  abdominal pain and vomiting.  Genitourinary: Negative.   Musculoskeletal: Negative for arthralgias, joint swelling and neck pain.  Skin: Negative.  Negative for rash and wound.  Neurological: Negative for dizziness, weakness, light-headedness, numbness and headaches.  Psychiatric/Behavioral: Negative.      Physical Exam Updated Vital Signs BP (!) 138/51   Pulse 67   Temp (!) 97.4 F (36.3 C) (Oral)   Resp 18   Ht 5\' 8"  (1.727 m)   Wt 70.8 kg   SpO2 100%   BMI 23.72 kg/m   Physical Exam  Constitutional: She appears well-developed and well-nourished.  HENT:  Head: Normocephalic and atraumatic.  Dry buccal mucosa.  Eyes: Conjunctivae are normal.  Neck: Normal range of motion.  Cardiovascular: Normal rate, regular rhythm, normal heart sounds and intact distal  pulses.  Less than 2-second cap refill in fingertips.  Pulmonary/Chest: Effort normal and breath sounds normal. She has no wheezes.  Abdominal: Soft. Bowel sounds are normal. She exhibits no distension. There is no tenderness. There is no guarding.  Musculoskeletal: Normal range of motion.  Neurological: She is alert.  Skin: Skin is warm and dry.  Psychiatric: She has a normal mood and affect.  Nursing note and vitals reviewed.    ED Treatments / Results  Labs (all labs ordered are listed, but only abnormal results are displayed) Labs Reviewed  COMPREHENSIVE METABOLIC PANEL - Abnormal; Notable for the following components:      Result Value   Glucose, Bld 152 (*)    GFR calc non Af Amer 58 (*)    All other components within normal limits  URINALYSIS, ROUTINE W REFLEX MICROSCOPIC - Abnormal; Notable for the following components:   Leukocytes, UA TRACE (*)    All other components within normal limits  C DIFFICILE QUICK SCREEN W PCR REFLEX  CBC WITH DIFFERENTIAL/PLATELET    EKG None  Radiology No results found.  Procedures Procedures (including critical care time)  Medications Ordered in  ED Medications  sodium chloride 0.9 % bolus 1,000 mL ( Intravenous Stopped 05/19/18 1145)  ondansetron (ZOFRAN) injection 4 mg (4 mg Intravenous Given 05/19/18 1042)     Initial Impression / Assessment and Plan / ED Course  I have reviewed the triage vital signs and the nursing notes.  Pertinent labs & imaging results that were available during my care of the patient were reviewed by me and considered in my medical decision making (see chart for details).     Patient was given a liter of IV fluids along with IV Zofran and felt improved.  She tolerated a snack and p.o. fluids without any difficulty.  Her labs were reviewed and discussed, stable.  She had no stools while here.  She has an appointment with her PCP tomorrow and appears stable for discharge home.  Prior to discharge she asked if she could take her Zofran which she has a prescription for if she is not vomiting.  Clarified that this medication is for nausea or vomiting and she should take it if she has nausea which may help to improve her appetite as well.  Plan follow-up with her PCP tomorrow.  Patient was seen by Dr. Roderic Palau prior to discharge home.`  Final Clinical Impressions(s) / ED Diagnoses   Final diagnoses:  Nausea  Diarrhea, unspecified type  Fatigue, unspecified type    ED Discharge Orders    None       Landis Martins 05/19/18 [place], MD 05/20/18 [phone]

## 2018-05-19 NOTE — ED Triage Notes (Signed)
Pt was sent home from a SNF on November 1st and states was doing well.  Pt c/o of nausea and thin diarrhea.  3 stools in 24 hours. Sample taken today at pcp

## 2018-05-19 NOTE — Discharge Instructions (Addendum)
Take your home Zofran as needed for any return of your nausea.  You do not have to be actively vomiting for this medication to be effective.  This should help you have a better appetite.  Your lab tests today are stable and it is safe for you to go home.  Plan to follow-up with Dr. Golden Circle this week for recheck of your symptoms.  He should be able to follow-up with the results of the stool sample that you dropped off at [facility] prior to arriving here.  This result will determine whether you need to be placed back on antibiotics, at this point in time we do not think that this is a return of your C. difficile.

## 2018-05-20 ENCOUNTER — Encounter: Payer: Self-pay | Admitting: Family Medicine

## 2018-05-20 ENCOUNTER — Ambulatory Visit (INDEPENDENT_AMBULATORY_CARE_PROVIDER_SITE_OTHER): Payer: Medicare Other | Admitting: Family Medicine

## 2018-05-20 VITALS — BP 138/54 | Ht 68.0 in | Wt 158.4 lb

## 2018-05-20 DIAGNOSIS — E119 Type 2 diabetes mellitus without complications: Secondary | ICD-10-CM | POA: Diagnosis not present

## 2018-05-20 DIAGNOSIS — R195 Other fecal abnormalities: Secondary | ICD-10-CM

## 2018-05-20 DIAGNOSIS — I10 Essential (primary) hypertension: Secondary | ICD-10-CM | POA: Diagnosis not present

## 2018-05-20 LAB — POCT GLUCOSE (DEVICE FOR HOME USE): GLUCOSE FASTING, POC: 108 mg/dL — AB (ref 70–99)

## 2018-05-20 NOTE — Patient Instructions (Addendum)
Please stop Indapamide  May continue the other medications  Reduce the metformin to 1/2 in the a.m. And 1/2 at supper

## 2018-05-20 NOTE — Progress Notes (Signed)
   Subjective:    Patient ID: [name], female    DOB: 1932/02/06, 84 y.o.   MRN: [id number]  HPI Pt here today for follow up. Pt was in ER for C-Diff. Pt states she is feeling a little better.  Stools were soft and some loose Felt nausea and hoit Poor appetite Patient is frustrated by her condition She is trying to eat but does not find much appetite for much of anything denies difficulty breathing vomiting denies fever chills sweats denies rectal bleeding She does states she turned in a stool specimen to the lab but has not heard anything yet  Review of Systems  Constitutional: Positive for fatigue and fever. Negative for activity change.  HENT: Negative for congestion, ear pain and rhinorrhea.   Eyes: Negative for discharge.  Respiratory: Negative for cough, shortness of breath and wheezing.   Cardiovascular: Negative for chest pain.  Gastrointestinal: Positive for diarrhea. Negative for vomiting.       Objective:   Physical Exam  Lungs are clear respiratory rate is normal heart is regular no murmurs pulses normal extremities no edema skin warm dry abdomen abdomen soft mild orthostatic hypotension  Sugar was checked does not seem to be out of line    Assessment & Plan:  Patient with a history of C. difficile I do not feel that she has C. difficile going on currently I do not feel she is dehydrated We are awaiting the results of her test She will follow-up as planned in a couple weeks We are reducing her blood pressure medicine Stopping diuretic Continue other medicines Reducing metformin to 1/2 tablet twice daily

## 2018-05-22 DIAGNOSIS — B351 Tinea unguium: Secondary | ICD-10-CM | POA: Diagnosis not present

## 2018-05-22 LAB — CLOSTRIDIUM DIFFICILE BY PCR: CDIFFPCR: NEGATIVE

## 2018-06-05 ENCOUNTER — Encounter: Payer: Self-pay | Admitting: Family Medicine

## 2018-06-05 ENCOUNTER — Ambulatory Visit (INDEPENDENT_AMBULATORY_CARE_PROVIDER_SITE_OTHER): Payer: Medicare Other | Admitting: Family Medicine

## 2018-06-05 ENCOUNTER — Ambulatory Visit: Payer: Medicare Other | Admitting: Family Medicine

## 2018-06-05 VITALS — BP 134/70 | Ht 68.0 in | Wt 157.0 lb

## 2018-06-05 DIAGNOSIS — G4709 Other insomnia: Secondary | ICD-10-CM

## 2018-06-05 DIAGNOSIS — F411 Generalized anxiety disorder: Secondary | ICD-10-CM | POA: Insufficient documentation

## 2018-06-05 DIAGNOSIS — I10 Essential (primary) hypertension: Secondary | ICD-10-CM | POA: Diagnosis not present

## 2018-06-05 MED ORDER — HYDROXYZINE HCL 10 MG PO TABS: ORAL_TABLET | ORAL | 5 refills | Status: DC

## 2018-06-05 MED ORDER — BUSPIRONE HCL 5 MG PO TABS: 5.0000 mg | ORAL_TABLET | Freq: Two times a day (BID) | ORAL | 2 refills | Status: DC

## 2018-06-05 NOTE — Progress Notes (Signed)
   Subjective:    Patient ID: [name], female    DOB: June 20, 1932, 84 y.o.   MRN: [id number]  Hypertension  This is a chronic problem. Pertinent negatives include no chest pain or shortness of breath.  pt states bp is up in the mornings.   States she needs something for sleep and anxiety.  She finds herself feeling worried at times she relates feeling anxious and also relates difficult time sleeping Hydroxyzine 25mg  take one qhs for sleep is on med list but pt states she does not take that. States she use to be on xanax and she wants something she can take during the day.     Review of Systems  Constitutional: Negative for activity change, appetite change and fatigue.  HENT: Negative for congestion and rhinorrhea.   Respiratory: Negative for cough and shortness of breath.   Cardiovascular: Negative for chest pain and leg swelling.  Gastrointestinal: Negative for abdominal pain and diarrhea.  Endocrine: Negative for polydipsia and polyphagia.  Skin: Negative for color change.  Neurological: Negative for dizziness and weakness.  Psychiatric/Behavioral: Negative for behavioral problems and confusion.       Objective:   Physical Exam  Constitutional: She appears well-nourished. No distress.  HENT:  Head: Normocephalic and atraumatic.  Eyes: Right eye exhibits no discharge. Left eye exhibits no discharge.  Neck: No tracheal deviation present.  Cardiovascular: Normal rate, regular rhythm and normal heart sounds.  No murmur heard. Pulmonary/Chest: Effort normal and breath sounds normal. No respiratory distress.  Musculoskeletal: She exhibits no edema.  Lymphadenopathy:    She has no cervical adenopathy.  Neurological: She is alert. Coordination normal.  Skin: Skin is warm and dry.  Psychiatric: She has a normal mood and affect. Her behavior is normal.  Vitals reviewed.         Assessment & Plan:  For sleep I recommend low-dose hydroxyzine I would not recommend any  type of nerve pill  For anxiety related issues I think it is best for her to get involved with activities as much as possible she has moderate amount of loneliness and isolation which is contributing She will try BuSpar twice a day to see how this helps  Her blood pressure today on examination was good I would not recommend any changes  Patient finds herself feeling like she cannot continue to keep living by herself at home currently right now she is able to meet her needs but this could change in the future she will follow-up in 6 weeks

## 2018-06-06 ENCOUNTER — Emergency Department (HOSPITAL_COMMUNITY)
Admission: EM | Admit: 2018-06-06 | Discharge: 2018-06-06 | Disposition: A | Discharge status: Home / Self Care | Payer: Medicare Other | Attending: Emergency Medicine | Admitting: Emergency Medicine

## 2018-06-06 ENCOUNTER — Encounter (HOSPITAL_COMMUNITY): Payer: Self-pay | Admitting: *Deleted

## 2018-06-06 ENCOUNTER — Other Ambulatory Visit: Payer: Self-pay

## 2018-06-06 DIAGNOSIS — I10 Essential (primary) hypertension: Secondary | ICD-10-CM | POA: Insufficient documentation

## 2018-06-06 DIAGNOSIS — R109 Unspecified abdominal pain: Secondary | ICD-10-CM | POA: Diagnosis not present

## 2018-06-06 DIAGNOSIS — K921 Melena: Secondary | ICD-10-CM | POA: Diagnosis not present

## 2018-06-06 DIAGNOSIS — Z79899 Other long term (current) drug therapy: Secondary | ICD-10-CM | POA: Diagnosis not present

## 2018-06-06 DIAGNOSIS — E119 Type 2 diabetes mellitus without complications: Secondary | ICD-10-CM | POA: Diagnosis not present

## 2018-06-06 DIAGNOSIS — R195 Other fecal abnormalities: Secondary | ICD-10-CM

## 2018-06-06 LAB — CBC
HCT: 40.6 % (ref 36.0–46.0)
Hemoglobin: 13 g/dL (ref 12.0–15.0)
MCH: 28.6 pg (ref 26.0–34.0)
MCHC: 32 g/dL (ref 30.0–36.0)
MCV: 89.2 fL (ref 80.0–100.0)
Platelets: 325 10*3/uL (ref 150–400)
RBC: 4.55 MIL/uL (ref 3.87–5.11)
RDW: 13.6 % (ref 11.5–15.5)
WBC: 9.4 10*3/uL (ref 4.0–10.5)
nRBC: 0 % (ref 0.0–0.2)

## 2018-06-06 LAB — COMPREHENSIVE METABOLIC PANEL
ALT: 14 U/L (ref 0–44)
AST: 14 U/L — AB (ref 15–41)
Albumin: 4.2 g/dL (ref 3.5–5.0)
Alkaline Phosphatase: 65 U/L (ref 38–126)
Anion gap: 9 (ref 5–15)
BUN: 15 mg/dL (ref 8–23)
CO2: 27 mmol/L (ref 22–32)
Calcium: 9.6 mg/dL (ref 8.9–10.3)
Chloride: 99 mmol/L (ref 98–111)
Creatinine, Ser: 0.92 mg/dL (ref 0.44–1.00)
GFR calc Af Amer: >60 mL/min (ref >60)
GFR calc non Af Amer: 57 mL/min — ABNORMAL LOW (ref >60)
Glucose, Bld: 120 mg/dL — ABNORMAL HIGH (ref 70–99)
POTASSIUM: 4.3 mmol/L (ref 3.5–5.1)
Sodium: 135 mmol/L (ref 135–145)
Total Bilirubin: 1 mg/dL (ref 0.3–1.2)
Total Protein: 6.8 g/dL (ref 6.5–8.1)

## 2018-06-06 LAB — TYPE AND SCREEN
ABO/RH(D): O POS
Antibody Screen: NEGATIVE

## 2018-06-06 LAB — LIPASE, BLOOD: Lipase: 25 U/L (ref 11–51)

## 2018-06-06 LAB — POC OCCULT BLOOD, ED: Fecal Occult Bld: NEGATIVE

## 2018-06-06 MED ORDER — SODIUM CHLORIDE 0.9 % IV SOLN
INTRAVENOUS | Status: DC
  Administered 2018-06-06: 16:00:00 via INTRAVENOUS

## 2018-06-06 MED ORDER — ONDANSETRON HCL 4 MG/2ML IJ SOLN
4.0000 mg | Freq: Once | INTRAMUSCULAR | Status: AC
  Administered 2018-06-06: 4 mg via INTRAVENOUS
  Filled 2018-06-06: qty 2

## 2018-06-06 NOTE — ED Triage Notes (Signed)
C/o abdominal burning with black stools onset yesterday

## 2018-06-06 NOTE — Discharge Instructions (Addendum)
Rectal exam here today was heme negative.  No evidence of any blood on the stool.  Dark stool could be secondary to the Pepto-Bismol.  Recommend recheck up by primary care provider later this week.  Return for any new or worse symptoms to include seeing any red blood or worse abdominal pain or feeling like you are going to pass out.  It is okay to take the Zofran as needed for nausea.

## 2018-06-06 NOTE — ED Provider Notes (Signed)
[facility] EMERGENCY DEPARTMENT Provider Note   CSN: [id number] Arrival date & time: 06/06/18  1318     History   Chief Complaint Chief Complaint  Patient presents with  . Melena    HPI [name] is a 84 y.o. female.  Patient reports the onset of black stools last evening.  Continued today.  Had nausea for about 2 to 4 days.  No vomiting decreased appetite.  Burning sensation in the abdomen but no pain.  Patient last had a colonoscopy about 6 7 years ago.  Without any abnormal findings.  Patient also did have Pepto-Bismol few days ago.  Patient is not seeing any red blood or maroon stools.  No vomiting of blood.  Patient is not on blood thinners.     Past Medical History:  Diagnosis Date  . Diabetes mellitus without complication ([place])   . Diverticulitis   . H/O bilateral breast reduction surgery   . Hyperlipidemia    a. intolerant to statins.   . Hypertension     Patient Active Problem List   Diagnosis Date Noted  . Generalized anxiety disorder 06/05/2018  . Vomiting and diarrhea 03/08/2018  . Dehydration 03/08/2018  . Hyponatremia 03/08/2018  . C. difficile diarrhea 03/03/2018  . Acute lower UTI 03/03/2018  . Hyperlipidemia associated with type 2 diabetes mellitus ([place]) 10/02/2016  . Pedal edema 10/02/2016  . Insomnia 10/02/2016  . SBO (small bowel obstruction) ([place]) 07/02/2016  . Dyspnea on exertion 02/24/2016  . Other fatigue   . E-coli UTI 06/18/2015  . Partial small bowel obstruction ([place]) 06/15/2015  . Ileitis, regional, with intestinal obstruction 06/15/2015  . Leukocytosis 12/16/2014  . Nausea and vomiting 12/15/2014  . HLD (hyperlipidemia) 12/15/2014  . Essential hypertension 12/15/2014  . Controlled type 2 diabetes mellitus without complication, without long-term current use of insulin ([place]) 02/15/2011    Past Surgical History:  Procedure Laterality Date  . ABDOMINAL HYSTERECTOMY    . APPENDECTOMY    . BREAST REDUCTION SURGERY    . COLON  SURGERY Left    Hemicolectomy due to diverticulitis  . EYE SURGERY  03/30/2009   cataract  . KNEE SURGERY    . LAPAROSCOPIC INCISIONAL / UMBILICAL / VENTRAL HERNIA REPAIR  02/23/2007   . REDUCTION MAMMAPLASTY Bilateral 2001  . REFRACTIVE SURGERY       OB History   None      Home Medications    Prior to Admission medications   Medication Sig Start Date End Date Taking? Authorizing Provider  amLODipine (NORVASC) 5 MG tablet Take 1 tablet (5 mg total) by mouth daily. 05/07/18  Yes Kathyrn Drown, MD  beta carotene w/minerals (OCUVITE) tablet Take 1 tablet by mouth daily.   Yes [provider]  busPIRone (BUSPAR) 5 MG tablet Take 1 tablet (5 mg total) by mouth 2 (two) times daily. 06/05/18  Yes Kathyrn Drown, MD  Cyanocobalamin 2500 MCG TABS Take 2,500 mcg by mouth daily.    Yes [provider]  hydrALAZINE (APRESOLINE) 10 MG tablet Take one po bid 05/07/18  Yes Luking, Kwinton Maahs A, MD  lidocaine (LIDODERM) 5 % Place 1 patch onto the skin daily. Remove & Discard patch within 12 hours or as directed by MD 03/12/18  Yes Kathie Dike, MD  metFORMIN (GLUCOPHAGE) 1000 MG tablet Take one tablet po in am,take 1/2 tablet at supper 05/08/18  Yes Luking, Geoff Dacanay A, MD  ondansetron (ZOFRAN ODT) 8 MG disintegrating tablet Take one three times daily as needed  for nausea 03/06/18  Yes Luking, Elayne Snare, MD  polyvinyl alcohol (LIQUIFILM TEARS) 1.4 % ophthalmic solution Place 1 drop into both eyes 3 (three) times daily.    Yes [provider]  saccharomyces boulardii (FLORASTOR) 250 MG capsule Take 1 capsule (250 mg total) by mouth 2 (two) times daily. 03/12/18  Yes Kathie Dike, MD  acetaminophen (TYLENOL) 500 MG tablet Take 500-1,000 mg by mouth every 6 (six) hours as needed for mild pain or moderate pain.    [provider]  feeding supplement, GLUCERNA SHAKE, (GLUCERNA SHAKE) LIQD Take 237 mLs by mouth 3 (three) times daily between meals. 03/05/18   Debbe Odea, MD    hydrOXYzine (ATARAX/VISTARIL) 10 MG tablet 1 qhs prn sleep 06/05/18   Kathyrn Drown, MD  losartan (COZAAR) 100 MG tablet TAKE 1 TABLET BY MOUTH ONCE DAILY 11/27/17   Kathyrn Drown, MD    Family History Family History  Problem Relation Age of Onset  . Hypertension Father        kidney  . Stroke Father   . Cancer Father   . Cancer Mother        ovaian  . Diabetes Brother   . Hypertension Sister     Social History Social History   Tobacco Use  . Smoking status: Never Smoker  . Smokeless tobacco: Never Used  Substance Use Topics  . Alcohol use: No  . Drug use: No     Allergies   Lisinopril; Phenergan [promethazine hcl]; Statins; Trazodone and nefazodone; and Prednisone   Review of Systems Review of Systems  Constitutional: Negative for fever.  HENT: Negative for congestion.   Eyes: Negative for redness.  Respiratory: Negative for cough and shortness of breath.   Cardiovascular: Negative for chest pain.  Gastrointestinal: Positive for abdominal pain and nausea. Negative for diarrhea and vomiting.  Genitourinary: Negative for dysuria and hematuria.  Musculoskeletal: Negative for back pain.  Neurological: Negative for syncope.  Hematological: Does not bruise/bleed easily.  Psychiatric/Behavioral: Negative for confusion.     Physical Exam Updated Vital Signs BP (!) 147/52   Pulse 64   Temp 97.9 F (36.6 C)   Resp 18   Ht 1.727 m (5\' 8" )   Wt 71.2 kg   SpO2 99%   BMI 23.87 kg/m   Physical Exam  Constitutional: She is oriented to person, place, and time. She appears well-developed and well-nourished. No distress.  HENT:  Head: Normocephalic and atraumatic.  Mouth/Throat: Oropharynx is clear and moist.  Eyes: Pupils are equal, round, and reactive to light. Conjunctivae and EOM are normal.  Neck: Neck supple.  Cardiovascular: Normal rate, regular rhythm and normal heart sounds.  Pulmonary/Chest: Effort normal and breath sounds normal. No respiratory  distress.  Abdominal: Soft. Bowel sounds are normal. There is no tenderness.  Genitourinary: Rectal exam shows guaiac negative stool.  Musculoskeletal: Normal range of motion. She exhibits no edema.  Neurological: She is alert and oriented to person, place, and time. No cranial nerve deficit or sensory deficit. She exhibits normal muscle tone. Coordination normal.  Skin: Skin is warm. No rash noted.  Nursing note and vitals reviewed.    ED Treatments / Results  Labs (all labs ordered are listed, but only abnormal results are displayed) Labs Reviewed  COMPREHENSIVE METABOLIC PANEL - Abnormal; Notable for the following components:      Result Value   Glucose, Bld 120 (*)    AST 14 (*)    GFR calc non Af Amer 57 (*)  All other components within normal limits  CBC  LIPASE, BLOOD  POC OCCULT BLOOD, ED  POC OCCULT BLOOD, ED  TYPE AND SCREEN    EKG None  Radiology No results found.  Procedures Procedures (including critical care time)  Medications Ordered in ED Medications  0.9 %  sodium chloride infusion ( Intravenous New Bag/Given 06/06/18 1627)  ondansetron (ZOFRAN) injection 4 mg (4 mg Intravenous Given 06/06/18 1627)     Initial Impression / Assessment and Plan / ED Course  I have reviewed the triage vital signs and the nursing notes.  Pertinent labs & imaging results that were available during my care of the patient were reviewed by me and considered in my medical decision making (see chart for details).     Work-up for the melena seems to be negative.  Hemoglobin hematocrit very stable.  Vital signs very stable.  Hemoccult testing of the rectal stool was heme negative.  Stools could have been dark in color secondary to Pepto-Bismol.  Patient stable for discharge home and close follow-up with primary care provider.  Would recommend that she see them this week and have her stool rechecked.  She will return for any new or worse symptoms.  To include any red blood in her  bowel movements.  Final Clinical Impressions(s) / ED Diagnoses   Final diagnoses:  Dark stools    ED Discharge Orders    None       Fredia Sorrow, MD 06/06/18 1753

## 2018-06-12 ENCOUNTER — Ambulatory Visit: Payer: Medicare Other | Admitting: Family Medicine

## 2018-06-15 ENCOUNTER — Other Ambulatory Visit: Payer: Self-pay

## 2018-06-15 MED ORDER — LOSARTAN POTASSIUM 100 MG PO TABS: 100.0000 mg | ORAL_TABLET | Freq: Every day | ORAL | 1 refills | Status: DC

## 2018-06-17 ENCOUNTER — Encounter: Payer: Self-pay | Admitting: Family Medicine

## 2018-06-17 ENCOUNTER — Ambulatory Visit (INDEPENDENT_AMBULATORY_CARE_PROVIDER_SITE_OTHER): Payer: Medicare Other | Admitting: Family Medicine

## 2018-06-17 VITALS — Ht 68.0 in | Wt 155.0 lb

## 2018-06-17 DIAGNOSIS — F320 Major depressive disorder, single episode, mild: Secondary | ICD-10-CM

## 2018-06-17 DIAGNOSIS — R5383 Other fatigue: Secondary | ICD-10-CM | POA: Diagnosis not present

## 2018-06-17 DIAGNOSIS — R195 Other fecal abnormalities: Secondary | ICD-10-CM | POA: Diagnosis not present

## 2018-06-17 NOTE — Progress Notes (Addendum)
Subjective:    Patient ID: [name], female    DOB: March 21, 1932, 84 y.o.   MRN: [id number]  HPI  Patient arrives for a follow up on recent ER visit for dark stools. Stools were negative for blood and hemoglobin stable in ER. Patient states she just doesn't feel good. Patient states she was given something for sleep from the ER but has been unable to take it.  This patient having difficult time sleeping her appetite is low she does not feel good she is concerned and worried that she is gone get C. difficile again she is stressed in addition this she has intermittent loose stools but she does drink some Gatorade as well as supplement drinks like Glucerna so some of this could be contributing  She went to the ER because of black stools but more than likely it was related to Pepto-Bismol which she is no longer taking  She has reoccurring worries that she will get C. difficile again she finds her self having a hard time getting through the day. Review of Systems  Constitutional: Negative for activity change, fatigue and fever.  HENT: Negative for congestion and rhinorrhea.   Respiratory: Negative for cough, chest tightness and shortness of breath.   Cardiovascular: Negative for chest pain and leg swelling.  Gastrointestinal: Negative for abdominal pain and nausea.  Skin: Negative for color change.  Neurological: Negative for dizziness and headaches.  Psychiatric/Behavioral: Negative for agitation and behavioral problems.       Objective:   Physical Exam Vitals signs reviewed.  Constitutional:      General: She is not in acute distress. HENT:     Head: Normocephalic and atraumatic.  Eyes:     General:        Right eye: No discharge.        Left eye: No discharge.  Neck:     Trachea: No tracheal deviation.  Cardiovascular:     Rate and Rhythm: Normal rate and regular rhythm.     Heart sounds: Normal heart sounds. No murmur.  Pulmonary:     Effort: Pulmonary effort is  normal. No respiratory distress.     Breath sounds: Normal breath sounds.  Lymphadenopathy:     Cervical: No cervical adenopathy.  Skin:    General: Skin is warm and dry.  Neurological:     Mental Status: She is alert.     Coordination: Coordination normal.  Psychiatric:        Behavior: Behavior normal.           Assessment & Plan:  Very complex patient She is very nice She is having some significant anxiety and depression symptoms related to recent illness She has a lot of uncertainties regarding getting older and having health issues Recently had severe C. difficile Spent some time in a rest home Is now at home Family is working with her but she feels overwhelmed at times has poor appetite  On today's exam I find no evidence of current infection dehydration I recommend C. difficile PCR stool test were noted was negative for blood I did have a long discussion with the patient as well as the family members 45 minutes was spent between the patient and family members and conferencing altogether.  Greater than half of this was spent in coordination and discussion of her health issues   She may use Imodium if frequent diarrhea if mucousy or bloody diarrhea to notify us  I will see her back in 3 to  4 weeks  I have advised her to stay away from Glucerna as well as Gatorade  The patient is diabetic She checks her sugar I recommend doing this once daily She is not on insulin

## 2018-06-17 NOTE — Progress Notes (Signed)
c 

## 2018-06-18 ENCOUNTER — Other Ambulatory Visit: Payer: Self-pay | Admitting: Family Medicine

## 2018-06-18 ENCOUNTER — Telehealth: Payer: Self-pay | Admitting: Family Medicine

## 2018-06-18 DIAGNOSIS — F320 Major depressive disorder, single episode, mild: Secondary | ICD-10-CM

## 2018-06-18 DIAGNOSIS — F411 Generalized anxiety disorder: Secondary | ICD-10-CM

## 2018-06-18 NOTE — Telephone Encounter (Signed)
Per [facility] - they are not taking new psychology/counseling patients  They are still taking psychiatry patients (but can't offer counseling)  Please advise (patient willing to go to [facility]?)

## 2018-06-18 NOTE — Telephone Encounter (Signed)
Pt contacted and informed that provider placed referral for counseling here in town. [facility] currently not taking new psych/counseling patients. Pt states she does not want to go to [facility] and pt states that she believes she will be fine; she is feeling better today. Pt informed that if she needs referral in the future to notify us. Pt verbalized understanding.

## 2018-06-18 NOTE — Telephone Encounter (Signed)
Depends on what was requested?

## 2018-06-18 NOTE — Progress Notes (Signed)
Referral placed.

## 2018-06-18 NOTE — Telephone Encounter (Signed)
Scott to see 

## 2018-06-19 ENCOUNTER — Telehealth: Payer: Self-pay | Admitting: Family Medicine

## 2018-06-19 ENCOUNTER — Other Ambulatory Visit: Payer: Self-pay | Admitting: Family Medicine

## 2018-06-19 MED ORDER — NITROFURANTOIN MONOHYD MACRO 100 MG PO CAPS: ORAL_CAPSULE | ORAL | 0 refills | Status: DC

## 2018-06-19 NOTE — Telephone Encounter (Signed)
Pt would like to know if there is anyway she can get a medication called in, pt is experiencing cloudy urine since around 3am this morning, when pt urinates she only goes a little at a time.  No burning with urination, no odor, nor abd pain or fever. Pt is unable to make it in to office due to lack of transportation. Please advise.    Pharmacy:  [facility] [address] [place] [address]

## 2018-06-19 NOTE — Telephone Encounter (Signed)
Please advise. Pt advised that we usually need to see patient to collect urine.

## 2018-06-19 NOTE — Telephone Encounter (Signed)
macrobid 100 bid for five days 

## 2018-06-19 NOTE — Telephone Encounter (Signed)
Medication sent in and patient is aware.  

## 2018-06-20 NOTE — Telephone Encounter (Signed)
The patient is scheduled to follow-up here in January.  It would be fine to discuss this further when she comes

## 2018-06-22 DIAGNOSIS — R195 Other fecal abnormalities: Secondary | ICD-10-CM | POA: Diagnosis not present

## 2018-06-22 NOTE — Telephone Encounter (Signed)
Discussed with pt. Pt verbalized understanding.  °

## 2018-06-24 LAB — CLOSTRIDIUM DIFFICILE BY PCR: CDIFFPCR: NEGATIVE

## 2018-06-25 ENCOUNTER — Telehealth: Payer: Self-pay | Admitting: Family Medicine

## 2018-06-25 NOTE — Telephone Encounter (Signed)
Pt needs order for diabetic test strips sent to [facility]  [facility] states they faxed [place] a request on 06/17/18 & haven't received it back)  Please call when done

## 2018-06-25 NOTE — Telephone Encounter (Signed)
Refills called in to pharm per dr Nicki Reaper. Pt notified.

## 2018-07-02 ENCOUNTER — Ambulatory Visit (INDEPENDENT_AMBULATORY_CARE_PROVIDER_SITE_OTHER): Payer: Medicare Other | Admitting: Otolaryngology

## 2018-07-02 DIAGNOSIS — H6123 Impacted cerumen, bilateral: Secondary | ICD-10-CM | POA: Diagnosis not present

## 2018-07-10 ENCOUNTER — Encounter: Payer: Self-pay | Admitting: Family Medicine

## 2018-07-10 ENCOUNTER — Ambulatory Visit (INDEPENDENT_AMBULATORY_CARE_PROVIDER_SITE_OTHER): Payer: Medicare Other | Admitting: Family Medicine

## 2018-07-10 VITALS — BP 150/60 | Ht 68.0 in | Wt 159.0 lb

## 2018-07-10 DIAGNOSIS — E119 Type 2 diabetes mellitus without complications: Secondary | ICD-10-CM

## 2018-07-10 DIAGNOSIS — R300 Dysuria: Secondary | ICD-10-CM | POA: Diagnosis not present

## 2018-07-10 DIAGNOSIS — I10 Essential (primary) hypertension: Secondary | ICD-10-CM

## 2018-07-10 LAB — POCT URINALYSIS DIPSTICK
Blood, UA: NEGATIVE
Nitrite, UA: POSITIVE
Protein, UA: POSITIVE — AB
Spec Grav, UA: 1.015 (ref 1.010–1.025)
pH, UA: 8 (ref 5.0–8.0)

## 2018-07-10 NOTE — Progress Notes (Signed)
Subjective:    Patient ID: [name], female    DOB: October 19, 1931, 85 y.o.   MRN: [id number]  HPI Patient is here today to follow up on her recent diarrhea.  Patient doing much better no sign of C. difficile currently she also states slight increase urination but no dysuria abdominal pain fever chills  She states the diarrhea has subsided.She states she is feeling a lot better.  She has brought a urine sample with her incase you want to send it off for anything. She is asymptomatic, no burning, no stinging.  Wants test strips accucheck easy max and supplies sent to [facility].  Review of Systems  Constitutional: Negative for activity change and appetite change.  HENT: Negative for congestion and rhinorrhea.   Respiratory: Negative for cough and shortness of breath.   Cardiovascular: Negative for chest pain and leg swelling.  Gastrointestinal: Negative for abdominal pain, nausea and vomiting.  Skin: Negative for color change.  Neurological: Negative for dizziness and weakness.  Psychiatric/Behavioral: Negative for agitation and confusion.       Objective:   Physical Exam Vitals signs reviewed.  Constitutional:      General: She is not in acute distress. HENT:     Head: Normocephalic.  Cardiovascular:     Rate and Rhythm: Normal rate and regular rhythm.     Heart sounds: Normal heart sounds. No murmur.  Pulmonary:     Effort: Pulmonary effort is normal.     Breath sounds: Normal breath sounds.  Lymphadenopathy:     Cervical: No cervical adenopathy.  Neurological:     Mental Status: She is alert.  Psychiatric:        Behavior: Behavior normal.    Results for orders placed or performed in visit on 07/10/18  POCT urinalysis dipstick  Result Value Ref Range   Color, UA pale    Clarity, UA cloudy    Glucose, UA     Bilirubin, UA     Ketones, UA     Spec Grav, UA 1.015 1.010 - 1.025   Blood, UA Negative    pH, UA 8.0 5.0 - 8.0   Protein, UA Positive (A) Negative   Urobilinogen, UA     Nitrite, UA Positive    Leukocytes, UA Large (3+) (A) Negative   Appearance cloudy    Odor            Assessment & Plan:  Diabetes type 2 checks her sugar once daily strips and machine written for  HTN- Patient was seen today as part of a visit regarding hypertension. The importance of healthy diet and regular physical activity was discussed. The importance of compliance with medications discussed.  Ideal goal is to keep blood pressure low elevated levels certainly below 644/03 when possible.  The patient was counseled that keeping blood pressure under control lessen his risk of complications.  The importance of regular follow-ups was discussed with the patient.  Low-salt diet such as DASH recommended.  Regular physical activity was recommended as well.  Patient was advised to keep regular follow-ups.  The patient was seen today as part of a comprehensive visit for diabetes. The importance of keeping her A1c at or below 7 was discussed.  Importance of regular physical activity was discussed.   The importance of adherence to medication as well as a controlled low starch/sugar diet was also discussed.  Standard follow-up visit recommended.  Also patient aware failure to keep diabetes under control increases the risk of complications.  Glucose readings are reviewed under good control  History of frequent UTI check urine culture but I have advised the patient against antibiotics because of risk of C. difficile

## 2018-07-14 DIAGNOSIS — M1711 Unilateral primary osteoarthritis, right knee: Secondary | ICD-10-CM | POA: Diagnosis not present

## 2018-07-14 LAB — URINE CULTURE

## 2018-07-14 LAB — SPECIMEN STATUS REPORT

## 2018-07-17 ENCOUNTER — Telehealth: Payer: Self-pay | Admitting: Family Medicine

## 2018-07-17 ENCOUNTER — Ambulatory Visit: Payer: Medicare Other | Admitting: Family Medicine

## 2018-07-17 ENCOUNTER — Other Ambulatory Visit: Payer: Self-pay | Admitting: Family Medicine

## 2018-07-17 MED ORDER — AMOXICILLIN 500 MG PO CAPS: ORAL_CAPSULE | ORAL | 0 refills | Status: DC

## 2018-07-17 NOTE — Telephone Encounter (Signed)
Informed patient that she is having symptoms of UTI and that we would send in amoxicillin 500 mg one tablet three times a day for 3 days. Informed patient that she should do a follow up within 10-14 days to recheck urine. Pt transferred up front to set up appt. Also informed patient that treating just a short span of time it is unlikely to trigger C Diff. Pt verbalized understanding.

## 2018-07-17 NOTE — Telephone Encounter (Signed)
Please advise. Thank you

## 2018-07-17 NOTE — Telephone Encounter (Signed)
Very complex patient The patient did have C. difficile but she is gotten over that We did do a C. difficile test over a week ago and it was normal The last time I saw her which was several days ago she was actually feeling better If she starts having multiple 3 or more loose stools per day or mucousy stools we would want to recheck for C. Difficile On her last visit she did do a urine specimen It did show a mild urinary tract infection She is having some symptoms that can go along with a urinary tract infection including dysuria  So based on all of this I recommend treating this infection If we do not treat the infection currently there is a risk it could get worse So therefore amoxicillin 500 mg 1 tablet 3 times daily for only 3 days Please let the patient know that by treating just a short span of time it is unlikely to trigger C. Difficile  I would recommend that we can schedule the patient to follow-up in 10 to 14 days and recheck her urine at that time and see how she is doing  Certainly we can see her sooner even next week if things get worse over the next several days

## 2018-07-17 NOTE — Telephone Encounter (Signed)
Pt states she has C-diff, Pt states she has spells of nausea, and gets lethargic, no appetite(pt states she's forcing herself to eat)  no fever or no abd pain, very little burning with urination. Advise.    Pharmacy:  [facility] [address] [place] - [place] [address]

## 2018-07-21 DIAGNOSIS — M1711 Unilateral primary osteoarthritis, right knee: Secondary | ICD-10-CM | POA: Diagnosis not present

## 2018-07-25 NOTE — Addendum Note (Signed)
Addended by: Mikey Kirschner on: 07/25/2018 10:42 AM   Modules accepted: Orders

## 2018-07-27 DIAGNOSIS — R300 Dysuria: Secondary | ICD-10-CM | POA: Diagnosis not present

## 2018-07-28 DIAGNOSIS — M1711 Unilateral primary osteoarthritis, right knee: Secondary | ICD-10-CM | POA: Diagnosis not present

## 2018-07-29 ENCOUNTER — Ambulatory Visit (INDEPENDENT_AMBULATORY_CARE_PROVIDER_SITE_OTHER): Payer: Medicare Other | Admitting: Family Medicine

## 2018-07-29 ENCOUNTER — Encounter: Payer: Self-pay | Admitting: Family Medicine

## 2018-07-29 VITALS — BP 148/70 | Temp 98.1°F | Ht 68.0 in | Wt 156.0 lb

## 2018-07-29 DIAGNOSIS — R109 Unspecified abdominal pain: Secondary | ICD-10-CM

## 2018-07-29 DIAGNOSIS — R350 Frequency of micturition: Secondary | ICD-10-CM | POA: Diagnosis not present

## 2018-07-29 LAB — POCT URINALYSIS DIPSTICK
Spec Grav, UA: <=1.005 — AB (ref 1.010–1.025)
pH, UA: 5 (ref 5.0–8.0)

## 2018-07-29 LAB — CLOSTRIDIUM DIFFICILE EIA: C difficile Toxins A+B, EIA: NEGATIVE

## 2018-07-29 MED ORDER — DICYCLOMINE HCL 10 MG PO CAPS: ORAL_CAPSULE | ORAL | 1 refills | Status: DC

## 2018-07-29 MED ORDER — METFORMIN HCL 1000 MG PO TABS: ORAL_TABLET | ORAL | 1 refills | Status: DC

## 2018-07-29 NOTE — Progress Notes (Signed)
Medication changed in Epic.

## 2018-07-29 NOTE — Progress Notes (Signed)
   Subjective:    Patient ID: [name], female    DOB: 1932-05-20, 85 y.o.   MRN: [id number]  Urinary Tract Infection   Chronicity: follow up to recheck urine.   Having burning in lower abdomen.  Intermittent lower abdominal discomfort She denies any excessive loose stools  Pt wants results of c diff test that she dropped off at lab on Monday.   Lost the sheet that had a list of foods that she could eat on it. Would like another copy.   Results for orders placed or performed in visit on 07/29/18  POCT urinalysis dipstick  Result Value Ref Range   Color, UA     Clarity, UA     Glucose, UA     Bilirubin, UA     Ketones, UA     Spec Grav, UA <=1.005 (A) 1.010 - 1.025   Blood, UA     pH, UA 5.0 5.0 - 8.0   Protein, UA     Urobilinogen, UA     Nitrite, UA     Leukocytes, UA Moderate (2+) (A) Negative   Appearance     Odor      Review of Systems  Constitutional: Negative for activity change, appetite change and fatigue.  HENT: Negative for congestion and rhinorrhea.   Respiratory: Negative for cough and shortness of breath.   Cardiovascular: Negative for chest pain and leg swelling.  Gastrointestinal: Negative for abdominal pain and diarrhea.  Endocrine: Negative for polydipsia and polyphagia.  Skin: Negative for color change.  Neurological: Negative for dizziness and weakness.  Psychiatric/Behavioral: Negative for behavioral problems and confusion.       Objective:   Physical Exam Vitals signs reviewed.  Constitutional:      General: She is not in acute distress. HENT:     Head: Normocephalic and atraumatic.  Eyes:     General:        Right eye: No discharge.        Left eye: No discharge.  Neck:     Trachea: No tracheal deviation.  Cardiovascular:     Rate and Rhythm: Normal rate and regular rhythm.     Heart sounds: Normal heart sounds. No murmur.  Pulmonary:     Effort: Pulmonary effort is normal. No respiratory distress.     Breath sounds: Normal  breath sounds.  Abdominal:     General: Abdomen is flat. There is no distension.     Palpations: Abdomen is soft. There is no mass.     Tenderness: There is no abdominal tenderness.     Hernia: No hernia is present.  Lymphadenopathy:     Cervical: No cervical adenopathy.  Skin:    General: Skin is warm and dry.  Neurological:     Mental Status: She is alert.     Coordination: Coordination normal.  Psychiatric:        Behavior: Behavior normal.           Assessment & Plan:  Lower abdominal cramps Bentyl as directed follow-up if ongoing troubles  I find no evidence of ongoing C. Difficile  Urinalysis under the microscope looks good I do not recommend antibiotics   Sugars are reasonable I recommend reducing metformin to 1/2 tablet twice daily  Follow-up in approximately 4 weeks

## 2018-08-07 ENCOUNTER — Telehealth: Payer: Self-pay | Admitting: *Deleted

## 2018-08-07 NOTE — Telephone Encounter (Signed)
Discussed with pt to increase amlodipine from 5mg  to 10mg  and to take pepcid otc 20mg  one daily. ED if worse over weekend. Pt verbalized understanding.

## 2018-08-07 NOTE — Telephone Encounter (Signed)
Pt states she has not felt good today. Having burning in stomach since having c diff. States it higher up than stomach. And dr Nicki Reaper gave her dicylomine to take but she has not taken because she was told it was for lower abdominal cramping and hers is more towards her chest. she is not having chest pain and this has been going on for awhile she states she has talked with dr Nicki Reaper about it. checked bp about 3 times today since she was not feeling well and it has been around 190/86. Dr Nicki Reaper cut her amlodipine from 10mg  to 5mg  at last visit on 07/29/18. Wonders if she should take the dicylomine and go back to amlodipine 10mg . No headache, no dizziness, no weakness.  [phone]

## 2018-08-07 NOTE — Telephone Encounter (Signed)
incr to 10 mg norvasc, burning in upper stomach try pepcid otc 20 mg daily

## 2018-08-10 ENCOUNTER — Ambulatory Visit (INDEPENDENT_AMBULATORY_CARE_PROVIDER_SITE_OTHER): Payer: Medicare Other | Admitting: Family Medicine

## 2018-08-10 VITALS — BP 138/78 | Wt 157.2 lb

## 2018-08-10 DIAGNOSIS — E119 Type 2 diabetes mellitus without complications: Secondary | ICD-10-CM

## 2018-08-10 DIAGNOSIS — R109 Unspecified abdominal pain: Secondary | ICD-10-CM

## 2018-08-10 DIAGNOSIS — R1033 Periumbilical pain: Secondary | ICD-10-CM

## 2018-08-10 DIAGNOSIS — E559 Vitamin D deficiency, unspecified: Secondary | ICD-10-CM | POA: Diagnosis not present

## 2018-08-10 DIAGNOSIS — I10 Essential (primary) hypertension: Secondary | ICD-10-CM | POA: Diagnosis not present

## 2018-08-10 MED ORDER — AMLODIPINE BESYLATE 10 MG PO TABS: 5.0000 mg | ORAL_TABLET | Freq: Every day | ORAL | 5 refills | Status: DC

## 2018-08-10 MED ORDER — FLUOXETINE HCL 10 MG PO CAPS: ORAL_CAPSULE | ORAL | 5 refills | Status: DC

## 2018-08-10 NOTE — Progress Notes (Signed)
   Subjective:    Patient ID: [name], female    DOB: [birth year], 85 y.o.   MRN: [id number]  HPI  Patient arrives not feeling well since she had C-diff in September. Patient still has buring in her stomach and cramping. Patient states she called Friday and stated since her Norvasc was decreased her blood pressure was up and Dr Richardson Landry increased her norvasc back to original dose but patient states she continues to feel bad.  Patient states she is not sleeping well.  Does not feel BuSpar is helping her any She wonders if she ought to be on Ativan In the past she has not wanted to be on any type and antidepressant  Patient relates intermittent abdominal cramps pain discomfort poor appetite does not want to eat does not when a train takes her medicine finds her self feeling anxious nervous and also to some degree depressed about her health she feels with her health was doing better she would feel better she has lost some weight she denies rectal bleeding denies hematuria denies fever chills sweats Review of Systems  Constitutional: Positive for unexpected weight change. Negative for activity change and appetite change.  HENT: Negative for congestion and rhinorrhea.   Respiratory: Negative for cough and shortness of breath.   Cardiovascular: Negative for chest pain and leg swelling.  Gastrointestinal: Positive for abdominal pain and nausea. Negative for vomiting.  Skin: Negative for color change.  Neurological: Negative for dizziness and weakness.  Psychiatric/Behavioral: Negative for agitation and confusion.       Objective:   Physical Exam Vitals signs reviewed.  Constitutional:      General: She is not in acute distress. HENT:     Head: Normocephalic.  Cardiovascular:     Rate and Rhythm: Normal rate and regular rhythm.     Heart sounds: Normal heart sounds. No murmur.  Pulmonary:     Effort: Pulmonary effort is normal.     Breath sounds: Normal breath sounds.  Abdominal:    Palpations: Abdomen is soft. There is no mass.     Tenderness: There is abdominal tenderness. There is no rebound.  Lymphadenopathy:     Cervical: No cervical adenopathy.  Neurological:     Mental Status: She is alert.  Psychiatric:        Behavior: Behavior normal.          Patient concerned her vitamin D level is causing her issues we will check this-it is unlikely it is causing her issues Assessment & Plan:  Abdominal tenderness lab work ordered Lack of appetite lack of energy probably related into her underlying illness plus also some depression Prozac 10 mg daily Follow-up next week Lab work ordered CAT scan ordered Referral to Dr. Tera Helper 25 minutes was spent with the patient.  This statement verifies that 25 minutes was indeed spent with the patient.  More than 50% of this visit-total duration of the visit-was spent in counseling and coordination of care. The issues that the patient came in for today as reflected in the diagnosis (s) please refer to documentation for further details.  Increase amlodipine back to 10 mg to keep blood pressure under good control Patient currently taking 10 mg amlodipine from Fridays discussion with Dr. Richardson Landry

## 2018-08-11 LAB — BASIC METABOLIC PANEL
BUN/Creatinine Ratio: 20 (ref 12–28)
BUN: 15 mg/dL (ref 8–27)
CO2: 25 mmol/L (ref 20–29)
CREATININE: 0.74 mg/dL (ref 0.57–1.00)
Calcium: 10.5 mg/dL — ABNORMAL HIGH (ref 8.7–10.3)
Chloride: 96 mmol/L (ref 96–106)
GFR calc Af Amer: 86 mL/min/{1.73_m2} (ref >59)
GFR calc non Af Amer: 75 mL/min/{1.73_m2} (ref >59)
Glucose: 117 mg/dL — ABNORMAL HIGH (ref 65–99)
Potassium: 5.2 mmol/L (ref 3.5–5.2)
Sodium: 140 mmol/L (ref 134–144)

## 2018-08-11 LAB — CBC WITH DIFFERENTIAL/PLATELET
Basophils Absolute: 0 10*3/uL (ref 0.0–0.2)
Basos: 0 %
EOS (ABSOLUTE): 0.1 10*3/uL (ref 0.0–0.4)
Eos: 1 %
HEMOGLOBIN: 13.7 g/dL (ref 11.1–15.9)
Hematocrit: 40.5 % (ref 34.0–46.6)
Immature Grans (Abs): 0 10*3/uL (ref 0.0–0.1)
Immature Granulocytes: 0 %
LYMPHS: 20 %
Lymphocytes Absolute: 2 10*3/uL (ref 0.7–3.1)
MCH: 29.3 pg (ref 26.6–33.0)
MCHC: 33.8 g/dL (ref 31.5–35.7)
MCV: 87 fL (ref 79–97)
MONOCYTES: 6 %
Monocytes Absolute: 0.6 10*3/uL (ref 0.1–0.9)
Neutrophils Absolute: 7.2 10*3/uL — ABNORMAL HIGH (ref 1.4–7.0)
Neutrophils: 73 %
Platelets: 369 10*3/uL (ref 150–450)
RBC: 4.68 x10E6/uL (ref 3.77–5.28)
RDW: 13.1 % (ref 11.7–15.4)
WBC: 10 10*3/uL (ref 3.4–10.8)

## 2018-08-11 LAB — HEPATIC FUNCTION PANEL
ALT: 15 IU/L (ref 0–32)
AST: 13 IU/L (ref 0–40)
Albumin: 4.7 g/dL — ABNORMAL HIGH (ref 3.6–4.6)
Alkaline Phosphatase: 88 IU/L (ref 39–117)
BILIRUBIN, DIRECT: 0.18 mg/dL (ref 0.00–0.40)
Bilirubin Total: 0.8 mg/dL (ref 0.0–1.2)
TOTAL PROTEIN: 7 g/dL (ref 6.0–8.5)

## 2018-08-11 LAB — VITAMIN D 25 HYDROXY (VIT D DEFICIENCY, FRACTURES): Vit D, 25-Hydroxy: 29.7 ng/mL — ABNORMAL LOW (ref 30.0–100.0)

## 2018-08-11 LAB — SEDIMENTATION RATE: Sed Rate: 9 mm/hr (ref 0–40)

## 2018-08-12 ENCOUNTER — Telehealth: Payer: Self-pay | Admitting: Family Medicine

## 2018-08-12 NOTE — Telephone Encounter (Signed)
Appt is 08/14/2018, arrive 10:15am - pt aware  Pt also asked about lab results

## 2018-08-12 NOTE — Telephone Encounter (Signed)
I can understand him not being able to get the scan in this week is her any possibility they could work her in next week (Otherwise may need to do this stat)

## 2018-08-12 NOTE — Telephone Encounter (Signed)
Please advise 

## 2018-08-12 NOTE — Telephone Encounter (Signed)
Next available for CT at [facility] is 08/25/2018 Dr. Nicki Reaper wanted CT this week  Please advise

## 2018-08-12 NOTE — Telephone Encounter (Signed)
Please advise. Thank you

## 2018-08-12 NOTE — Telephone Encounter (Signed)
Next open appointment is 08/25/2018  Need to order stat so I can try to get scheduled

## 2018-08-13 ENCOUNTER — Telehealth: Payer: Self-pay | Admitting: *Deleted

## 2018-08-14 ENCOUNTER — Telehealth: Payer: Self-pay | Admitting: Family Medicine

## 2018-08-14 ENCOUNTER — Ambulatory Visit (HOSPITAL_COMMUNITY)
Admission: RE | Admit: 2018-08-14 | Discharge: 2018-08-14 | Disposition: A | Discharge status: Home / Self Care | Payer: Medicare Other | Source: Ambulatory Visit | Attending: Family Medicine | Admitting: Family Medicine

## 2018-08-14 DIAGNOSIS — R109 Unspecified abdominal pain: Secondary | ICD-10-CM | POA: Diagnosis not present

## 2018-08-14 DIAGNOSIS — K8020 Calculus of gallbladder without cholecystitis without obstruction: Secondary | ICD-10-CM | POA: Diagnosis not present

## 2018-08-14 NOTE — Telephone Encounter (Signed)
Pt has follow up on Thursday with Dr. Nicki Reaper. She is unable to see Dr. Laural Golden until the 26th. She did have her CT today. She is wanting to know if Dr. Nicki Reaper still wants to see her Thursday or wait until she sees Dr. Laural Golden before coming in.

## 2018-08-14 NOTE — Telephone Encounter (Signed)
So at this point the results of the CT scan are not back They should be posted by Monday We will definitely call her on Monday to let her know what the scan is showing Then we can decide whether or not to keep the follow-up on Thursday or delay it a little longer Certainly it is not a bother for [place] to see her on Thursday if she wants to keep the appointment

## 2018-08-14 NOTE — Telephone Encounter (Signed)
Please advise 

## 2018-08-14 NOTE — Telephone Encounter (Signed)
error 

## 2018-08-14 NOTE — Telephone Encounter (Signed)
Pt contacted and informed that we would call her Monday with results on CT scan and whether we need to keep appt on Thursday or not. Pt verbalized understanding.

## 2018-08-17 NOTE — Telephone Encounter (Signed)
Discussed results with pt. Pt wanted to let you know that she believes the contrast they gave her caused her constipation. She tried some miralax and had a bm yesterday. First one since the contrast. States she will follow up on Thursday unless the weather is bad and wanted to know should she come in sooner than Thursday.

## 2018-08-18 NOTE — Telephone Encounter (Signed)
At this point I do not feel the patient has to come in earlier unless he feels he is having problems for which he would want to be seen earlier otherwise Thursday would be fine if the weather turns nasty on Thursday we will reschedule

## 2018-08-18 NOTE — Telephone Encounter (Signed)
Discussed with pt. Pt verbalized understanding.  °

## 2018-08-20 ENCOUNTER — Ambulatory Visit: Payer: Medicare Other | Admitting: Family Medicine

## 2018-08-26 ENCOUNTER — Encounter (INDEPENDENT_AMBULATORY_CARE_PROVIDER_SITE_OTHER): Payer: Self-pay | Admitting: *Deleted

## 2018-08-26 ENCOUNTER — Ambulatory Visit (INDEPENDENT_AMBULATORY_CARE_PROVIDER_SITE_OTHER): Payer: Medicare Other | Admitting: Internal Medicine

## 2018-08-26 ENCOUNTER — Encounter (INDEPENDENT_AMBULATORY_CARE_PROVIDER_SITE_OTHER): Payer: Self-pay | Admitting: Internal Medicine

## 2018-08-26 DIAGNOSIS — K8020 Calculus of gallbladder without cholecystitis without obstruction: Secondary | ICD-10-CM

## 2018-08-26 NOTE — Patient Instructions (Signed)
HIDA scan.

## 2018-08-26 NOTE — Progress Notes (Signed)
Subjective:    Patient ID: [name], female    DOB: [birth year], 85 y.o.   MRN: [id number]  HPI Referred by Dr. Wolfgang Phoenix for abdominal pain. Every since she had C-diff in September of 2019. She was covered with Vancomycin.  She had a UTI and was started on Cipro and she develop C-diff. Since then her C-diff's were negative.  Someday's she feels good and someday's she feels bad.  Spent time in an assisted living x 2 months after she had the C-diff. She says her stools are formed and some are loose (but not diarrhea). She averages 2 stools a day.  Her appetite is better. Her church as been feed her. She has lost 11 pounds since the C diff.  For the last months she has not had any weight loss.  She feels 60% better.  She walked to the mail box yesterday.  She is drinking Glucerna 1-2 a day and is drinking Gatorade.   CT abdomen/pelvis (extreme fatigue, diarrhea, abdominal pain, hx of C diff in September).  on 08/14/2018 revealed: IMPRESSION: Cholelithiasis. No CT evidence of colitis or other bowel pathology. Prior ventral hernia repair without recurrence.  07/27/2018 C diff negative.   Review of Systems Past Medical History:  Diagnosis Date  . Diabetes mellitus without complication ([place])   . Diverticulitis   . H/O bilateral breast reduction surgery   . Hyperlipidemia    a. intolerant to statins.   . Hypertension     Past Surgical History:  Procedure Laterality Date  . ABDOMINAL HYSTERECTOMY    . APPENDECTOMY    . BREAST REDUCTION SURGERY    . COLON SURGERY Left    Hemicolectomy due to diverticulitis  . EYE SURGERY  03/30/2009   cataract  . KNEE SURGERY    . LAPAROSCOPIC INCISIONAL / UMBILICAL / VENTRAL HERNIA REPAIR  02/23/2007   . REDUCTION MAMMAPLASTY Bilateral 2001  . REFRACTIVE SURGERY      Allergies  Allergen Reactions  . Lisinopril     Other reaction(s): Lethargy (intolerance)  . Phenergan [Promethazine Hcl] Other (See Comments)    hallucinations  .  Statins Other (See Comments)    Severe myalgias  . Trazodone And Nefazodone Cough  . Prednisone Rash    Current Outpatient Medications on File Prior to Visit  Medication Sig Dispense Refill  . amLODipine (NORVASC) 10 MG tablet Take 0.5 tablets (5 mg total) by mouth daily. 30 tablet 5  . beta carotene w/minerals (OCUVITE) tablet Take 1 tablet by mouth daily.    . busPIRone (BUSPAR) 5 MG tablet Take 5 mg by mouth 2 (two) times daily.    . Cholecalciferol (VITAMIN D3) 10 MCG (400 UNIT) tablet Take 400 Units by mouth daily.    . Cyanocobalamin 2500 MCG TABS Take 2,500 mcg by mouth daily.     . famotidine (PEPCID) 20 MG tablet Take 20 mg by mouth 2 (two) times daily.    . hydrALAZINE (APRESOLINE) 10 MG tablet Take one po bid 180 tablet 1  . lactobacillus acidophilus (BACID) TABS tablet Take 2 tablets by mouth 3 (three) times daily.    Marland Kitchen losartan (COZAAR) 100 MG tablet Take 1 tablet (100 mg total) by mouth daily. 90 tablet 1  . metFORMIN (GLUCOPHAGE) 1000 MG tablet take 1/2 tablet bid 180 tablet 1  . saccharomyces boulardii (FLORASTOR) 250 MG capsule Take 1 capsule (250 mg total) by mouth 2 (two) times daily. 60 capsule 0   No current facility-administered medications on file  prior to visit.         Objective:   Physical Exam Blood pressure (!) 153/75, pulse 90, temperature (!) 97.5 F (36.4 C), height 5\' 8"  (1.727 m), weight 157 lb 6.4 oz (71.4 kg). Alert and oriented. Skin warm and dry. Oral mucosa is moist.   . Sclera anicteric, conjunctivae is pink. Thyroid not enlarged. No cervical lymphadenopathy. Lungs clear. Heart regular rate and rhythm.  Abdomen is soft. Bowel sounds are positive. No hepatomegaly. No abdominal masses felt. No tenderness.  No edema to lower extremities.         Assessment & Plan:  Weakness. C diff, diarrhea. Stools are much better.  Last 3 C diff were negative.  She was encourage to eat 3 meals and day and try to get outside and exercise. Gallstones: Am going  to get a HIDA scan for completeness.  Further recommendations to follow.

## 2018-08-28 ENCOUNTER — Ambulatory Visit (INDEPENDENT_AMBULATORY_CARE_PROVIDER_SITE_OTHER): Payer: Medicare Other | Admitting: Family Medicine

## 2018-08-28 ENCOUNTER — Telehealth: Payer: Self-pay | Admitting: Family Medicine

## 2018-08-28 ENCOUNTER — Encounter: Payer: Self-pay | Admitting: Family Medicine

## 2018-08-28 VITALS — BP 130/78 | Temp 97.7°F | Ht 68.0 in | Wt 161.8 lb

## 2018-08-28 DIAGNOSIS — R1033 Periumbilical pain: Secondary | ICD-10-CM

## 2018-08-28 MED ORDER — BUSPIRONE HCL 5 MG PO TABS: 5.0000 mg | ORAL_TABLET | Freq: Two times a day (BID) | ORAL | 5 refills | Status: DC

## 2018-08-28 NOTE — Telephone Encounter (Signed)
Patient was seen today and was told in the room to come back in April but she has appointment 3/11. She wanted to know if she needs to cancel that and come in April instead.

## 2018-08-28 NOTE — Telephone Encounter (Signed)
The appointment in April will be fine no need to come on the 11th if she needs to be seen sooner we will be happy to work with her

## 2018-08-28 NOTE — Progress Notes (Signed)
   Subjective:    Patient ID: [name], female    DOB: 11-02-1931, 85 y.o.   MRN: [id number]  HPI pt arrives with cousin Pam.   Follow up on abdominal pain.  Saw GI 2 days ago. HIDA scan next Thursday. Very nice patient States her nerves are doing better on the BuSpar Does not want to take Prozac Denies feeling depressed currently Bowel movements are doing well Denies rectal bleeding or mucus  Pt did not start prozac because she read it could affect vision and she states she already does not see good.   Needs refill on  Buspirone 5mg  one bid.    Review of Systems  Constitutional: Negative for activity change, appetite change and fatigue.  HENT: Negative for congestion and rhinorrhea.   Respiratory: Negative for cough and shortness of breath.   Cardiovascular: Negative for chest pain and leg swelling.  Gastrointestinal: Negative for abdominal pain and diarrhea.  Endocrine: Negative for polydipsia and polyphagia.  Skin: Negative for color change.  Neurological: Negative for dizziness and weakness.  Psychiatric/Behavioral: Negative for behavioral problems and confusion.       Objective:   Physical Exam Vitals signs reviewed.  Constitutional:      General: She is not in acute distress. HENT:     Head: Normocephalic and atraumatic.  Eyes:     General:        Right eye: No discharge.        Left eye: No discharge.  Neck:     Trachea: No tracheal deviation.  Cardiovascular:     Rate and Rhythm: Normal rate and regular rhythm.     Heart sounds: Normal heart sounds. No murmur.  Pulmonary:     Effort: Pulmonary effort is normal. No respiratory distress.     Breath sounds: Normal breath sounds.  Lymphadenopathy:     Cervical: No cervical adenopathy.  Skin:    General: Skin is warm and dry.  Neurological:     Mental Status: She is alert.     Coordination: Coordination normal.  Psychiatric:        Behavior: Behavior normal.   Abdominal exam normal          Assessment & Plan:  I believe this patient is resolving her issue She was wondering how long people typically have C. difficile I tried to encourage her that I feel that she is over the C. difficile she is just dealing with some negative consequences that should get better over time HIDA test as recommended by GI  No depression detected no need for Prozac may continue BuSpar patient's oral intake is good recommend follow-up in a few months time sooner problems warning signs were discussed

## 2018-08-31 NOTE — Telephone Encounter (Signed)
Rescheduled appointment for a month and canceled the one for 3/11. New appointment is April 28

## 2018-09-01 DIAGNOSIS — B351 Tinea unguium: Secondary | ICD-10-CM | POA: Diagnosis not present

## 2018-09-03 ENCOUNTER — Encounter (HOSPITAL_COMMUNITY): Payer: Self-pay

## 2018-09-03 ENCOUNTER — Encounter (HOSPITAL_COMMUNITY)
Admission: RE | Admit: 2018-09-03 | Discharge: 2018-09-03 | Disposition: A | Discharge status: Home / Self Care | Payer: Medicare Other | Source: Ambulatory Visit | Attending: Internal Medicine | Admitting: Internal Medicine

## 2018-09-03 DIAGNOSIS — K8020 Calculus of gallbladder without cholecystitis without obstruction: Secondary | ICD-10-CM | POA: Diagnosis not present

## 2018-09-03 MED ORDER — TECHNETIUM TC 99M MEBROFENIN IV KIT
5.0000 | PACK | Freq: Once | INTRAVENOUS | Status: AC | PRN
  Administered 2018-09-03: 5.23 via INTRAVENOUS

## 2018-09-09 ENCOUNTER — Ambulatory Visit: Payer: Medicare Other | Admitting: Family Medicine

## 2018-09-25 ENCOUNTER — Telehealth: Payer: Self-pay | Admitting: Family Medicine

## 2018-09-25 ENCOUNTER — Ambulatory Visit (INDEPENDENT_AMBULATORY_CARE_PROVIDER_SITE_OTHER): Payer: Medicare Other | Admitting: Family Medicine

## 2018-09-25 DIAGNOSIS — N390 Urinary tract infection, site not specified: Secondary | ICD-10-CM | POA: Diagnosis not present

## 2018-09-25 MED ORDER — AMOXICILLIN 500 MG PO CAPS: ORAL_CAPSULE | ORAL | 0 refills | Status: DC

## 2018-09-25 NOTE — Telephone Encounter (Signed)
Prescription sent electronically to pharmacy.  Patient notified and verbalized understanding. 

## 2018-09-25 NOTE — Telephone Encounter (Signed)
Sure may ref times one, phone visit

## 2018-09-25 NOTE — Telephone Encounter (Signed)
Patient was given Amoxil for same urinary sx on 1/17/202 and would like this sent to pharmacy. Patient having urge to urinate and hesitancy and dysuria with no fever or blood in urine.

## 2018-09-25 NOTE — Progress Notes (Signed)
   Subjective:    Patient ID: [name], female    DOB: 1931/07/19, 85 y.o.   MRN: [id number]  HPI  Patient requesting refill for amoxicillin (AMOXIL) 500 MG capsule for UTI.  Patient states a few weeks ago she was experiencing same symptoms of the urge to pee and the time frequency it takes for patient to urinate as well as a yellow coloration to urine, as well as generalized weakness.  No fever or abd pain, no N/V, and no diarrhea. Patient was given Amoxil 500 mg one tablet 3 times a day for 3 days for these symptoms on 07/17/2018.  Consult with Dr Mickie Hillier who advises to refill medication as requested and call back if ongoing issues- ER if worse. Warning signs discussed with patient.    Patient states she has remained home and has not traveled internationally in the last month nor been around anyone suspected for COVID-19.  Spent 10 minutes with this patient on this phone call  Review of Systems     Objective:   Physical Exam  See copy of note's      Assessment & Plan:

## 2018-09-25 NOTE — Telephone Encounter (Signed)
Requesting refill for amoxicillin (AMOXIL) 500 MG capsule  Patient states a few weeks ago she was experiencing same symptoms of the urge to pee and the time frequency it takes for patient to urinate as well as a yellow coloration to urine, as well as generalized weakness.  No fever or abd pain, no N/V, and no diarrhea.   Patient states she has remained home and has not traveled internationally in the last month nor been around anyone suspected for COVID-19.     Pharmacy:  [facility] [address] [place] [id number]

## 2018-10-22 ENCOUNTER — Telehealth: Payer: Self-pay | Admitting: Family Medicine

## 2018-10-22 DIAGNOSIS — I10 Essential (primary) hypertension: Secondary | ICD-10-CM | POA: Diagnosis not present

## 2018-10-22 DIAGNOSIS — R079 Chest pain, unspecified: Secondary | ICD-10-CM | POA: Diagnosis not present

## 2018-10-22 DIAGNOSIS — R531 Weakness: Secondary | ICD-10-CM | POA: Diagnosis not present

## 2018-10-22 NOTE — Telephone Encounter (Signed)
Pt contacted office. Pt states her heart rate and blood pressure have been up all day. Pt states she can barely put one arm up. Pt sounded very fatigued on the phone. Pt states no trouble breathing, no shortness of breath, no headache, no vision problems. Pt asked if she needed to call EMS to have her checked out. Nurse informed that with those symptoms she would need to be check out immediately. Pt verbalized understanding.

## 2018-10-23 NOTE — Telephone Encounter (Signed)
Please do a follow-up phone call with patient find out how she is doing Based upon the medical record it does not appear that she went to the ER? She may not necessarily need to go to the ER I would like to see how things are going currently I may need to do a virtual visit with her today Please see previous nurse message from yesterday please handle accordingly

## 2018-10-23 NOTE — Telephone Encounter (Signed)
Patient stated she called EMS and they told her she was a little dehydrated and not drinking a lot but she is feeling better today. Patient states she will hold on virtual visit since she is feeling better and will call us back for virtual visit if any further problems.

## 2018-10-27 ENCOUNTER — Telehealth: Payer: Self-pay | Admitting: Family Medicine

## 2018-10-27 ENCOUNTER — Ambulatory Visit (INDEPENDENT_AMBULATORY_CARE_PROVIDER_SITE_OTHER): Payer: Medicare Other | Admitting: Family Medicine

## 2018-10-27 ENCOUNTER — Other Ambulatory Visit: Payer: Self-pay

## 2018-10-27 ENCOUNTER — Other Ambulatory Visit: Payer: Self-pay | Admitting: Family Medicine

## 2018-10-27 DIAGNOSIS — R5383 Other fatigue: Secondary | ICD-10-CM | POA: Diagnosis not present

## 2018-10-27 DIAGNOSIS — I10 Essential (primary) hypertension: Secondary | ICD-10-CM | POA: Diagnosis not present

## 2018-10-27 DIAGNOSIS — F411 Generalized anxiety disorder: Secondary | ICD-10-CM | POA: Diagnosis not present

## 2018-10-27 MED ORDER — FLUOXETINE HCL 10 MG PO CAPS: ORAL_CAPSULE | ORAL | 3 refills | Status: DC

## 2018-10-27 MED ORDER — ALPRAZOLAM 0.25 MG PO TABS: ORAL_TABLET | ORAL | 1 refills | Status: DC

## 2018-10-27 NOTE — Addendum Note (Signed)
Addended by: Vicente Males on: 10/27/2018 11:58 AM   Modules accepted: Orders

## 2018-10-27 NOTE — Telephone Encounter (Signed)
Pt had virtual visit with Dr. Nicki Reaper and forgot to ask if the Prozac causes her some nausea could she take Zofran to help with that. She already has that on hand.

## 2018-10-27 NOTE — Progress Notes (Signed)
   Subjective:    Patient ID: [name], female    DOB: 1932/02/22, 85 y.o.   MRN: [id number]  HPI Format- phone  Patient present at home Provider present at office Consent for interaction obtained to use [facility] Coronavirus outbreak made virtual visit necessary   Patient states she not feeling good today- didn't sleep well last night. Patient states it is a every other day kinda of thing- she felt really good yesterday and did some washing and stuff around house and then didn't sleep last night and feels terrible today. Patient wants to know if she should be taking vitamins Patient wants to know if the C diff is causing her to fel terrible every other day. Patient states some days she cant even function   This patient suffers with a lot of anxiety and some depression symptoms she tries to do the best she can  She also had very serious cases C. difficile which caused a lot of fatigue tiredness and sickness for her but she is recovering  Every now and then she wonders if she has C. difficile but denies any bloody stools or diarrhea  Patient relates compliance with her medicines she will need lab work in the summer Virtual Visit via Video Note  I connected with [name] on 10/27/18 at  1:10 PM EDT by a video enabled telemedicine application and verified that I am speaking with the correct person using two identifiers.   I discussed the limitations of evaluation and management by telemedicine and the availability of in person appointments. The patient expressed understanding and agreed to proceed.  History of Present Illness:    Observations/Objective:   Assessment and Plan:   Follow Up Instructions:    I discussed the assessment and treatment plan with the patient. The patient was provided an opportunity to ask questions and all were answered. The patient agreed with the plan and demonstrated an understanding of the instructions.   The patient was advised to  call back or seek an in-person evaluation if the symptoms worsen or if the condition fails to improve as anticipated.  I provided 15 minutes of non-face-to-face time during this encounter.      Review of Systems     Objective:   Physical Exam        Assessment & Plan:  Blood pressure good control continue current measures  Significant fatigue intermittent probably age-related but will need lab work in early summer  History of diabetes we will check lab work in the early summer  Generalized anxiety disorder we did discuss Prozac she has a prescription of this at home but never took it She will try the 10 mg 1 a day we will follow-up with her in 10 to 14 days She will use Xanax sparingly 0.25 mg half a tablet twice daily as needed anxiety Patient was wondering if she could take it every single day or take 1 at night to help her sleep I encourage patient not to do that because of increased risk of falling she seems to understand this  Patient was cautioned that Prozac can cause nausea and if she gets persistent nausea and stop the medicine let us know if she feels she is having any problems to let us know

## 2018-10-27 NOTE — Progress Notes (Signed)
Medication added to med list.

## 2018-10-28 NOTE — Telephone Encounter (Signed)
I am glad she is doing better To avoid the possibility of accidentally taking it she should throw the Prozac away if she has a bottle at home Please remove Prozac from her med list

## 2018-10-28 NOTE — Telephone Encounter (Signed)
Medication removed from med list.

## 2018-10-28 NOTE — Telephone Encounter (Signed)
That would be permissible

## 2018-10-28 NOTE — Telephone Encounter (Signed)
Patient states she is scared of the Prozac and is not going to take it. Patient states she slept good last night and feels much better this morning

## 2018-11-09 ENCOUNTER — Other Ambulatory Visit: Payer: Self-pay | Admitting: Family Medicine

## 2018-11-13 ENCOUNTER — Telehealth: Payer: Self-pay | Admitting: Family Medicine

## 2018-11-13 MED ORDER — NITROFURANTOIN MONOHYD MACRO 100 MG PO CAPS: 100.0000 mg | ORAL_CAPSULE | Freq: Two times a day (BID) | ORAL | 0 refills | Status: DC

## 2018-11-13 NOTE — Telephone Encounter (Signed)
Pt would like to know if something can be called in for UTI pt is having burning when urinating. It started yesterday and is a little worse today. No fever.   CB# [phone]   [place], [place] [id number]

## 2018-11-13 NOTE — Telephone Encounter (Signed)
Prescription sent electronically to pharmacy. Patient notified. 

## 2018-11-13 NOTE — Telephone Encounter (Signed)
macrobid 100 bid 7 d 

## 2018-11-26 ENCOUNTER — Encounter (HOSPITAL_COMMUNITY): Payer: Self-pay | Admitting: *Deleted

## 2018-11-26 ENCOUNTER — Other Ambulatory Visit: Payer: Self-pay

## 2018-11-26 ENCOUNTER — Emergency Department (HOSPITAL_COMMUNITY)
Admission: EM | Admit: 2018-11-26 | Discharge: 2018-11-26 | Disposition: A | Discharge status: Home / Self Care | Payer: Medicare Other | Attending: Emergency Medicine | Admitting: Emergency Medicine

## 2018-11-26 DIAGNOSIS — R197 Diarrhea, unspecified: Secondary | ICD-10-CM | POA: Insufficient documentation

## 2018-11-26 DIAGNOSIS — R5381 Other malaise: Secondary | ICD-10-CM | POA: Diagnosis not present

## 2018-11-26 DIAGNOSIS — R531 Weakness: Secondary | ICD-10-CM | POA: Diagnosis not present

## 2018-11-26 DIAGNOSIS — I10 Essential (primary) hypertension: Secondary | ICD-10-CM | POA: Insufficient documentation

## 2018-11-26 DIAGNOSIS — Z79899 Other long term (current) drug therapy: Secondary | ICD-10-CM | POA: Diagnosis not present

## 2018-11-26 DIAGNOSIS — E119 Type 2 diabetes mellitus without complications: Secondary | ICD-10-CM | POA: Diagnosis not present

## 2018-11-26 DIAGNOSIS — E785 Hyperlipidemia, unspecified: Secondary | ICD-10-CM | POA: Insufficient documentation

## 2018-11-26 DIAGNOSIS — E860 Dehydration: Secondary | ICD-10-CM | POA: Diagnosis not present

## 2018-11-26 DIAGNOSIS — R110 Nausea: Secondary | ICD-10-CM | POA: Diagnosis not present

## 2018-11-26 LAB — CBC WITH DIFFERENTIAL/PLATELET
Abs Immature Granulocytes: 0.06 10*3/uL (ref 0.00–0.07)
Basophils Absolute: 0 10*3/uL (ref 0.0–0.1)
Basophils Relative: 0 %
Eosinophils Absolute: 0.2 10*3/uL (ref 0.0–0.5)
Eosinophils Relative: 2 %
HCT: 38.1 % (ref 36.0–46.0)
Hemoglobin: 12.5 g/dL (ref 12.0–15.0)
Immature Granulocytes: 1 %
Lymphocytes Relative: 21 %
Lymphs Abs: 1.8 10*3/uL (ref 0.7–4.0)
MCH: 29.8 pg (ref 26.0–34.0)
MCHC: 32.8 g/dL (ref 30.0–36.0)
MCV: 90.9 fL (ref 80.0–100.0)
Monocytes Absolute: 0.7 10*3/uL (ref 0.1–1.0)
Monocytes Relative: 9 %
Neutro Abs: 5.6 10*3/uL (ref 1.7–7.7)
Neutrophils Relative %: 67 %
Platelets: 262 10*3/uL (ref 150–400)
RBC: 4.19 MIL/uL (ref 3.87–5.11)
RDW: 13.2 % (ref 11.5–15.5)
WBC: 8.4 10*3/uL (ref 4.0–10.5)
nRBC: 0 % (ref 0.0–0.2)

## 2018-11-26 LAB — COMPREHENSIVE METABOLIC PANEL
ALT: 19 U/L (ref 0–44)
AST: 17 U/L (ref 15–41)
Albumin: 3.7 g/dL (ref 3.5–5.0)
Alkaline Phosphatase: 69 U/L (ref 38–126)
Anion gap: 9 (ref 5–15)
BUN: 16 mg/dL (ref 8–23)
CO2: 28 mmol/L (ref 22–32)
Calcium: 9.1 mg/dL (ref 8.9–10.3)
Chloride: 104 mmol/L (ref 98–111)
Creatinine, Ser: 0.67 mg/dL (ref 0.44–1.00)
GFR calc Af Amer: >60 mL/min (ref >60)
GFR calc non Af Amer: >60 mL/min (ref >60)
Glucose, Bld: 124 mg/dL — ABNORMAL HIGH (ref 70–99)
Potassium: 4.1 mmol/L (ref 3.5–5.1)
Sodium: 141 mmol/L (ref 135–145)
Total Bilirubin: 0.8 mg/dL (ref 0.3–1.2)
Total Protein: 6.4 g/dL — ABNORMAL LOW (ref 6.5–8.1)

## 2018-11-26 LAB — URINALYSIS, ROUTINE W REFLEX MICROSCOPIC
Bilirubin Urine: NEGATIVE
Glucose, UA: NEGATIVE mg/dL
Hgb urine dipstick: NEGATIVE
Ketones, ur: NEGATIVE mg/dL
Leukocytes,Ua: NEGATIVE
Nitrite: NEGATIVE
Protein, ur: NEGATIVE mg/dL
Specific Gravity, Urine: 1.006 (ref 1.005–1.030)
pH: 9 — ABNORMAL HIGH (ref 5.0–8.0)

## 2018-11-26 LAB — LIPASE, BLOOD: Lipase: 28 U/L (ref 11–51)

## 2018-11-26 MED ORDER — ONDANSETRON HCL 4 MG/2ML IJ SOLN
4.0000 mg | Freq: Once | INTRAMUSCULAR | Status: AC
  Administered 2018-11-26: 4 mg via INTRAVENOUS
  Filled 2018-11-26: qty 2

## 2018-11-26 MED ORDER — SODIUM CHLORIDE 0.9 % IV BOLUS
1000.0000 mL | Freq: Once | INTRAVENOUS | Status: AC
  Administered 2018-11-26: 1000 mL via INTRAVENOUS

## 2018-11-26 NOTE — Discharge Instructions (Addendum)
Your lab tests were good.  Nurse will give you container for stool specimen.  If your symptoms persist tomorrow, return the sample to [place] going to the ED triage with the specimen.

## 2018-11-26 NOTE — ED Notes (Signed)
Pt assisted to bathroom via wheelchair and back to bed 

## 2018-11-26 NOTE — ED Triage Notes (Signed)
Pt brought in by [facility] for c/o generalized weakness that started today; cbg 127; pt states she felt nauseous today; pt has had some soft stools today x 6; pt has a past hx of c-diff and pt states she was on an antibiotic for a uti x 3 weeks ago

## 2018-11-26 NOTE — ED Notes (Signed)
Patient transported to CT 

## 2018-11-26 NOTE — ED Provider Notes (Addendum)
[facility] EMERGENCY DEPARTMENT Provider Note   CSN: [id number] Arrival date & time: 11/26/18  2032    History   Chief Complaint Chief Complaint  Patient presents with  . Weakness    HPI [name] is a 85 y.o. female.     Several episodes of nonbloody diarrhea today.  Patient was diagnosed with C. difficile in September 2019.  Recent diagnosis of UTI and Rx of Macrobid which has finished.  She feels weak.  No fever, sweats, chills, vomiting, chest pain, dyspnea, dysuria.  Severity is moderate.     Past Medical History:  Diagnosis Date  . Diabetes mellitus without complication ([place])   . Diverticulitis   . H/O bilateral breast reduction surgery   . Hyperlipidemia    a. intolerant to statins.   . Hypertension     Patient Active Problem List   Diagnosis Date Noted  . Generalized anxiety disorder 06/05/2018  . Vomiting and diarrhea 03/08/2018  . Dehydration 03/08/2018  . Hyponatremia 03/08/2018  . C. difficile diarrhea 03/03/2018  . Acute lower UTI 03/03/2018  . Hyperlipidemia associated with type 2 diabetes mellitus ([place]) 10/02/2016  . Pedal edema 10/02/2016  . Insomnia 10/02/2016  . SBO (small bowel obstruction) ([place]) 07/02/2016  . Dyspnea on exertion 02/24/2016  . Other fatigue   . E-coli UTI 06/18/2015  . Partial small bowel obstruction ([place]) 06/15/2015  . Ileitis, regional, with intestinal obstruction 06/15/2015  . Leukocytosis 12/16/2014  . Nausea and vomiting 12/15/2014  . HLD (hyperlipidemia) 12/15/2014  . Essential hypertension 12/15/2014  . Controlled type 2 diabetes mellitus without complication, without long-term current use of insulin ([place]) 02/15/2011    Past Surgical History:  Procedure Laterality Date  . ABDOMINAL HYSTERECTOMY    . APPENDECTOMY    . BREAST REDUCTION SURGERY    . COLON SURGERY Left    Hemicolectomy due to diverticulitis  . EYE SURGERY  03/30/2009   cataract  . KNEE SURGERY    . LAPAROSCOPIC INCISIONAL / UMBILICAL /  VENTRAL HERNIA REPAIR  02/23/2007   . REDUCTION MAMMAPLASTY Bilateral 2001  . REFRACTIVE SURGERY       OB History   No obstetric history on file.      Home Medications    Prior to Admission medications   Medication Sig Start Date End Date Taking? Authorizing Provider  ALPRAZolam Duanne Moron) 0.25 MG tablet Half tablet twice daily as needed anxiety 10/27/18   Kathyrn Drown, MD  amLODipine (NORVASC) 10 MG tablet Take 0.5 tablets (5 mg total) by mouth daily. 08/10/18   Kathyrn Drown, MD  amoxicillin (AMOXIL) 500 MG capsule Take one capsule by mouth three times daily for 3 days Patient not taking: Reported on 10/27/2018 09/25/18   Mikey Kirschner, MD  beta carotene w/minerals (OCUVITE) tablet Take 1 tablet by mouth daily.    [provider]  busPIRone (BUSPAR) 5 MG tablet Take 1 tablet (5 mg total) by mouth 2 (two) times daily. 08/28/18   Kathyrn Drown, MD  Cholecalciferol (VITAMIN D3) 10 MCG (400 UNIT) tablet Take 400 Units by mouth daily.    [provider]  Cyanocobalamin 2500 MCG TABS Take 2,500 mcg by mouth daily.     [provider]  famotidine (PEPCID) 20 MG tablet Take 20 mg by mouth 2 (two) times daily.    [provider]  hydrALAZINE (APRESOLINE) 10 MG tablet Take 1 tablet by mouth twice daily 10/27/18   Kathyrn Drown, MD  lactobacillus acidophilus (BACID)  TABS tablet Take 2 tablets by mouth 3 (three) times daily.    [provider]  losartan (COZAAR) 100 MG tablet Take 1 tablet by mouth once daily 11/09/18   Kathyrn Drown, MD  metFORMIN (GLUCOPHAGE) 1000 MG tablet take 1/2 tablet bid 07/29/18   Kathyrn Drown, MD  nitrofurantoin, macrocrystal-monohydrate, (MACROBID) 100 MG capsule Take 1 capsule (100 mg total) by mouth 2 (two) times daily. 11/13/18   Mikey Kirschner, MD  saccharomyces boulardii (FLORASTOR) 250 MG capsule Take 1 capsule (250 mg total) by mouth 2 (two) times daily. 03/12/18   Kathie Dike, MD    Family History  Family History  Problem Relation Age of Onset  . Hypertension Father        kidney  . Stroke Father   . Cancer Father   . Cancer Mother        ovaian  . Diabetes Brother   . Hypertension Sister     Social History Social History   Tobacco Use  . Smoking status: Never Smoker  . Smokeless tobacco: Never Used  Substance Use Topics  . Alcohol use: No  . Drug use: No     Allergies   Lisinopril; Phenergan [promethazine hcl]; Statins; Trazodone and nefazodone; and Prednisone   Review of Systems Review of Systems  All other systems reviewed and are negative.    Physical Exam Updated Vital Signs BP (!) 167/60   Pulse 63   Temp (!) 97.4 F (36.3 C) (Oral)   Resp 17   Ht 5\' 8"  (1.727 m)   Wt 71.7 kg   SpO2 99%   BMI 24.02 kg/m   Physical Exam Vitals signs and nursing note reviewed.  Constitutional:      Appearance: She is well-developed.  HENT:     Head: Normocephalic and atraumatic.  Eyes:     Conjunctiva/sclera: Conjunctivae normal.  Neck:     Musculoskeletal: Neck supple.  Cardiovascular:     Rate and Rhythm: Normal rate and regular rhythm.  Pulmonary:     Effort: Pulmonary effort is normal.     Breath sounds: Normal breath sounds.  Abdominal:     General: Bowel sounds are normal.     Palpations: Abdomen is soft.     Comments: Minimal generalized tenderness.  Musculoskeletal: Normal range of motion.  Skin:    General: Skin is warm and dry.  Neurological:     Mental Status: She is alert and oriented to person, place, and time.  Psychiatric:        Behavior: Behavior normal.      ED Treatments / Results  Labs (all labs ordered are listed, but only abnormal results are displayed) Labs Reviewed  COMPREHENSIVE METABOLIC PANEL - Abnormal; Notable for the following components:      Result Value   Glucose, Bld 124 (*)    Total Protein 6.4 (*)    All other components within normal limits  URINALYSIS, ROUTINE W REFLEX MICROSCOPIC - Abnormal; Notable  for the following components:   Color, Urine STRAW (*)    pH 9.0 (*)    All other components within normal limits  C DIFFICILE QUICK SCREEN W PCR REFLEX  CBC WITH DIFFERENTIAL/PLATELET  LIPASE, BLOOD    EKG None  Radiology No results found.  Procedures Procedures (including critical care time)  Medications Ordered in ED Medications  sodium chloride 0.9 % bolus 1,000 mL (0 mLs Intravenous Stopped 11/26/18 2214)  ondansetron (ZOFRAN) injection 4 mg (4 mg Intravenous Given  11/26/18 2121)     Initial Impression / Assessment and Plan / ED Course  I have reviewed the triage vital signs and the nursing notes.  Pertinent labs & imaging results that were available during my care of the patient were reviewed by me and considered in my medical decision making (see chart for details).        Patient presents with multiple bouts of diarrhea and she is weak.  Will hydrate, IV Zofran, labs, C. difficile specimen.  Patient rechecked prior to discharge.  No diarrheal stools.  Color improved.  No acute abdomen.  Will discharge home.  She will be given stool specimen container for which she can return tomorrow with a specimen if appropriate  Final Clinical Impressions(s) / ED Diagnoses   Final diagnoses:  Diarrhea, unspecified type    ED Discharge Orders    None       Nat Christen, MD 11/26/18 2111    Nat Christen, MD 11/26/18 2300

## 2018-11-27 ENCOUNTER — Other Ambulatory Visit (HOSPITAL_COMMUNITY)
Admission: RE | Admit: 2018-11-27 | Discharge: 2018-11-27 | Disposition: A | Discharge status: Home / Self Care | Payer: Medicare Other | Source: Ambulatory Visit | Attending: Emergency Medicine | Admitting: Emergency Medicine

## 2018-11-27 DIAGNOSIS — R197 Diarrhea, unspecified: Secondary | ICD-10-CM | POA: Insufficient documentation

## 2018-11-27 LAB — C DIFFICILE QUICK SCREEN W PCR REFLEX
C Diff antigen: NEGATIVE
C Diff interpretation: NOT DETECTED
C Diff toxin: NEGATIVE

## 2018-12-01 ENCOUNTER — Telehealth: Payer: Self-pay | Admitting: Family Medicine

## 2018-12-01 NOTE — Telephone Encounter (Signed)
Patient notified that her c diff test was negative. Patient verbalized understanding.

## 2018-12-01 NOTE — Telephone Encounter (Signed)
c diff test negative

## 2018-12-01 NOTE — Telephone Encounter (Signed)
Pt would like to know the results from her c diff test

## 2018-12-07 ENCOUNTER — Ambulatory Visit (INDEPENDENT_AMBULATORY_CARE_PROVIDER_SITE_OTHER): Payer: Medicare Other | Admitting: Otolaryngology

## 2018-12-07 DIAGNOSIS — H6123 Impacted cerumen, bilateral: Secondary | ICD-10-CM

## 2018-12-16 ENCOUNTER — Telehealth: Payer: Self-pay | Admitting: *Deleted

## 2018-12-16 NOTE — Telephone Encounter (Signed)
Discussed with pt. Pt verbalized understanding. Transferred to the front to schedule an office visit.

## 2018-12-16 NOTE — Telephone Encounter (Signed)
Pt takes losartan, amlodipine, and hydralazine 10mg  one bid, she states after she takes in the morning her heart rate goes up. She has not checked her heart rate but can feel that it is up. It last til about lunch time. But yesterday it was up til about 9am. Feels ok now on the phone. She checked bp 174/80 and pulse 104 and then sat for 10 mins and rechecked 159/70 hr 95. She states this started about 4 -5 days ago.   [facility] [place]

## 2018-12-16 NOTE — Telephone Encounter (Signed)
So currently I would recommend for this patient to continue with her medicines Check blood pressure twice daily along with heart rate recording these on paper I can see her next week in the office-as long as the patient feels comfortable coming to the office-I would give her reassurance that we are providing a safe environment  Also if patient is worried about waiting till next week I could work her into the schedule at [employer] tomorrow

## 2018-12-18 ENCOUNTER — Other Ambulatory Visit: Payer: Self-pay

## 2018-12-18 ENCOUNTER — Ambulatory Visit (INDEPENDENT_AMBULATORY_CARE_PROVIDER_SITE_OTHER): Payer: Medicare Other | Admitting: Family Medicine

## 2018-12-18 ENCOUNTER — Telehealth: Payer: Self-pay | Admitting: Family Medicine

## 2018-12-18 DIAGNOSIS — I10 Essential (primary) hypertension: Secondary | ICD-10-CM

## 2018-12-18 MED ORDER — HYDRALAZINE HCL 10 MG PO TABS: ORAL_TABLET | ORAL | 0 refills | Status: DC

## 2018-12-18 MED ORDER — FAMOTIDINE 20 MG PO TABS: 20.0000 mg | ORAL_TABLET | Freq: Two times a day (BID) | ORAL | 0 refills | Status: DC

## 2018-12-18 NOTE — Telephone Encounter (Signed)
Patient has cough and feels bad.  Cancel in house visit Monday? (Patient has a phone visit with Dr. Richardson Landry today)

## 2018-12-18 NOTE — Progress Notes (Signed)
   Subjective:    Patient ID: [name], female    DOB: May 12, 1932, 85 y.o.   MRN: [id number] Audio only Cough This is a new problem. The current episode started 1 to 4 weeks ago.   Patient states she has a cough at night and gets up feeling bad and BP is high 198/98 but then as the day goes on she starts toi feel better and then it cycles back around ever day  Virtual Visit via Video Note  I connected with [name] on 12/18/18 at 11:30 AM EDT by a video enabled telemedicine application and verified that I am speaking with the correct person using two identifiers.  Location: Patient: home Provider: office   I discussed the limitations of evaluation and management by telemedicine and the availability of in person appointments. The patient expressed understanding and agreed to proceed.  History of Present Illness:    Observations/Objective:   Assessment and Plan:   Follow Up Instructions:    I discussed the assessment and treatment plan with the patient. The patient was provided an opportunity to ask questions and all were answered. The patient agreed with the plan and demonstrated an understanding of the instructions.   The patient was advised to call back or seek an in-person evaluation if the symptoms worsen or if the condition fails to improve as anticipated.  I provided 18 minutes of non-face-to-face time during this encounter.  Patient concerned about her blood pressure.  Often elevated.  Patient checks rather frequently.  Many numbers are suboptimal.  When called in anticipation of Monday's visit patient reported cough.  She is coughing only at night.  No achiness no headache no sore throat no fever no daytime cough at all.  On further history uses Pepcid intermittently for reflux.  Encouraged to move to 20 mg twice daily faithfully.  Patient would prefer to maintain a twice daily dosage   Review of Systems  Respiratory: Positive for cough.         Objective:   Physical Exam  Virtual      Assessment & Plan:  Impression 1 hypertension suboptimal discussed increase hydralazine from twice daily to 3 times daily same 10 mg dosage  2.  Cough nocturnal likely reflux discussed increase Pepcid  Patient wanted to be sure to as scheduled with Dr. Nicki Reaper this Monday follow-up

## 2018-12-18 NOTE — Telephone Encounter (Signed)
Per Dr Richardson Landry Patient may keep appt with Dr Nicki Reaper Monday

## 2018-12-21 ENCOUNTER — Other Ambulatory Visit: Payer: Self-pay

## 2018-12-21 ENCOUNTER — Ambulatory Visit (INDEPENDENT_AMBULATORY_CARE_PROVIDER_SITE_OTHER): Payer: Medicare Other | Admitting: Family Medicine

## 2018-12-21 VITALS — BP 134/70 | Temp 97.9°F | Ht 68.0 in | Wt 166.6 lb

## 2018-12-21 DIAGNOSIS — I10 Essential (primary) hypertension: Secondary | ICD-10-CM

## 2018-12-21 DIAGNOSIS — E119 Type 2 diabetes mellitus without complications: Secondary | ICD-10-CM

## 2018-12-21 LAB — POCT URINALYSIS DIPSTICK
Spec Grav, UA: 1.02 (ref 1.010–1.025)
pH, UA: 7 (ref 5.0–8.0)

## 2018-12-21 LAB — POCT GLYCOSYLATED HEMOGLOBIN (HGB A1C): Hemoglobin A1C: 6.1 % — AB (ref 4.0–5.6)

## 2018-12-21 MED ORDER — ALPRAZOLAM 0.25 MG PO TABS: ORAL_TABLET | ORAL | 5 refills | Status: DC

## 2018-12-21 MED ORDER — AMLODIPINE BESYLATE 10 MG PO TABS: 5.0000 mg | ORAL_TABLET | Freq: Every day | ORAL | 5 refills | Status: DC

## 2018-12-21 MED ORDER — METFORMIN HCL 1000 MG PO TABS: ORAL_TABLET | ORAL | 1 refills | Status: DC

## 2018-12-21 MED ORDER — LOSARTAN POTASSIUM 100 MG PO TABS: 100.0000 mg | ORAL_TABLET | Freq: Every day | ORAL | 1 refills | Status: DC

## 2018-12-21 MED ORDER — HYDRALAZINE HCL 10 MG PO TABS: ORAL_TABLET | ORAL | 1 refills | Status: DC

## 2018-12-21 NOTE — Progress Notes (Signed)
Subjective:    Patient ID: [name], female    DOB: [birth year], 85 y.o.   MRN: [id number]  Hypertension This is a chronic problem. Pertinent negatives include no chest pain, headaches or shortness of breath.  pt brought in bp readings. Pt states bp has been better since increasing hydralazine 10mg  to one tid.   bs [phone] before breaksfast.   Pt brought in urine today. Not having any issues just wanted it checked.   Pt started taking one whole xanax daily instead of one half bid.   Pt wants to know if she can take benadryl at night to help sleep.   Having a cough at night. Pt states she has been using pepcid to help with cough.   Results for orders placed or performed in visit on 12/21/18  POCT glycosylated hemoglobin (Hb A1C)  Result Value Ref Range   Hemoglobin A1C 6.1 (A) 4.0 - 5.6 %   HbA1c POC (<> result, manual entry)     HbA1c, POC (prediabetic range)     HbA1c, POC (controlled diabetic range)    POCT urinalysis dipstick  Result Value Ref Range   Color, UA     Clarity, UA     Glucose, UA     Bilirubin, UA     Ketones, UA     Spec Grav, UA 1.020 1.010 - 1.025   Blood, UA     pH, UA 7.0 5.0 - 8.0   Protein, UA     Urobilinogen, UA     Nitrite, UA     Leukocytes, UA Small (1+) (A) Negative   Appearance     Odor          Review of Systems  Constitutional: Negative for activity change, fatigue and fever.  HENT: Negative for congestion and rhinorrhea.   Respiratory: Negative for cough, chest tightness and shortness of breath.   Cardiovascular: Negative for chest pain and leg swelling.  Gastrointestinal: Negative for abdominal pain and nausea.  Skin: Negative for color change.  Neurological: Negative for dizziness and headaches.  Psychiatric/Behavioral: Negative for agitation and behavioral problems.       Objective:   Physical Exam Vitals signs reviewed.  Constitutional:      General: She is not in acute distress. HENT:     Head:  Normocephalic and atraumatic.  Eyes:     General:        Right eye: No discharge.        Left eye: No discharge.  Neck:     Trachea: No tracheal deviation.  Cardiovascular:     Rate and Rhythm: Normal rate and regular rhythm.     Heart sounds: Normal heart sounds. No murmur.  Pulmonary:     Effort: Pulmonary effort is normal. No respiratory distress.     Breath sounds: Normal breath sounds.  Lymphadenopathy:     Cervical: No cervical adenopathy.  Skin:    General: Skin is warm and dry.  Neurological:     Mental Status: She is alert.     Coordination: Coordination normal.  Psychiatric:        Behavior: Behavior normal.     Blood pressure 134/70 sitting and standing 15 minutes was spent with patient today discussing healthcare issues which they came.  More than 50% of this visit-total duration of visit-was spent in counseling and coordination of care.  Please see diagnosis regarding the focus of this coordination and care      Assessment & Plan:  Diabetes stable under good control continue medication  Blood pressure good control with current medications continue this  Follow-up office visit in September

## 2018-12-28 ENCOUNTER — Telehealth: Payer: Self-pay | Admitting: Family Medicine

## 2018-12-28 NOTE — Telephone Encounter (Signed)
Pt states that she would like to monitor herself over the next couple days.

## 2018-12-28 NOTE — Telephone Encounter (Signed)
A week ago when we did her office visit her blood pressure was very good  I am willing to see her again if she would like-if so she should bring her blood pressure cuff with her along with her medicines so we can review over all of these  I would not recommend Gatorade, Pedialyte, Powerade , or electrolyte water all of these have too much salt which can cause blood pressure to go up  If the patient would prefer to just watch things over the next week then be rechecked next week if no better than that would be fine as well-we are here to help as much as possible I would not recommend any lab work at this time  If bowel movements start becoming progressively looser or mucousy we would want to do stool test-the likelihood of return of C. difficile is low

## 2018-12-28 NOTE — Telephone Encounter (Signed)
Pt son returned call. [name] states that mom has been feeling sluggish and tired and also having some loose bowel movements. [name] unsure of what blood pressure was. Contacted patient. Pt states that she felt lousy yesterday. Pt has had BP med adjusted several times over the past few months and is not sure if that what is causing her to feel lousy. Pt was also drinking Gatorade but was advised to stop; pt stopped Gatorade yesterday and feels better today. Pt states that her BP has been 154/80(today); 157/76;190/95 and 170/94. Pt states that Dr.Steve increased Hydralizine to TID. Pt states that last week she has some off days but felt good on Monday and Friday. Pt and pt son wanting to know if pt can drink Pedialyte to decrease dehydration. Also wanting to know if her symptoms have anything to do with med changes. Please advise. Thank you

## 2018-12-28 NOTE — Telephone Encounter (Signed)
Left message for son to return call.

## 2018-12-28 NOTE — Telephone Encounter (Signed)
Son(Jason) had some concerns about patient due to the fact that she has been sluggish and tired for the last few days and wondering is it due to the adjustment you made to her blood pressure medication from her last visit. Also he wanted to know if she could drink pedia light.Please advise- You can call son or patient . [phone]

## 2018-12-30 ENCOUNTER — Other Ambulatory Visit: Payer: Self-pay

## 2018-12-30 ENCOUNTER — Ambulatory Visit (INDEPENDENT_AMBULATORY_CARE_PROVIDER_SITE_OTHER): Payer: Medicare Other | Admitting: Family Medicine

## 2018-12-30 DIAGNOSIS — I10 Essential (primary) hypertension: Secondary | ICD-10-CM

## 2018-12-30 DIAGNOSIS — R5383 Other fatigue: Secondary | ICD-10-CM | POA: Diagnosis not present

## 2018-12-30 NOTE — Progress Notes (Signed)
   Subjective:    Patient ID: [name], female    DOB: 1932/03/31, 85 y.o.   MRN: [id number]  HPI Patient states she cant function after taking her meds. Patient states after she takes her meds she feels terrible -burns in her chest and can't eat and cant do anything until her meds get out of her symptom.  Before meds BP 152/79 and pulse 71 and she could eat but about one hour after her meds she gets to feeling terrible and cant eat and function. BP an pulse goes up and cant function or eat again till the pm when it is out of her system again. She relates her heart rate goes up after the medicine gets nauseated makes her feel bad she would like to change medicines Virtual Visit via Video Note  I connected with [name] on 12/30/18 at 10:00 AM EDT by a video enabled telemedicine application and verified that I am speaking with the correct person using two identifiers.  Location: Patient: home Provider: office   I discussed the limitations of evaluation and management by telemedicine and the availability of in person appointments. The patient expressed understanding and agreed to proceed.  History of Present Illness:    Observations/Objective:   Assessment and Plan:   Follow Up Instructions:    I discussed the assessment and treatment plan with the patient. The patient was provided an opportunity to ask questions and all were answered. The patient agreed with the plan and demonstrated an understanding of the instructions.   The patient was advised to call back or seek an in-person evaluation if the symptoms worsen or if the condition fails to improve as anticipated.  I provided 15 minutes of non-face-to-face time during this encounter.     Review of Systems  Constitutional: Negative for activity change, fatigue and fever.  HENT: Negative for congestion and rhinorrhea.   Respiratory: Negative for cough, chest tightness and shortness of breath.   Cardiovascular:  Negative for chest pain and leg swelling.  Gastrointestinal: Positive for nausea. Negative for abdominal pain.  Skin: Negative for color change.  Neurological: Positive for dizziness. Negative for headaches.  Psychiatric/Behavioral: Negative for agitation and behavioral problems.       Objective:   Physical Exam  Today's visit was via telephone Physical exam was not possible for this visit       Assessment & Plan:  Blood pressure could be affected by her medications it is possible she is having side effects I believe the patient needs an office visit review over her meds as well as check blood pressure with our cuff and her cuff

## 2018-12-31 ENCOUNTER — Ambulatory Visit (INDEPENDENT_AMBULATORY_CARE_PROVIDER_SITE_OTHER): Payer: Medicare Other | Admitting: Family Medicine

## 2018-12-31 ENCOUNTER — Other Ambulatory Visit: Payer: Self-pay | Admitting: Family Medicine

## 2018-12-31 VITALS — BP 148/86 | Temp 97.9°F

## 2018-12-31 DIAGNOSIS — I10 Essential (primary) hypertension: Secondary | ICD-10-CM | POA: Diagnosis not present

## 2018-12-31 DIAGNOSIS — E119 Type 2 diabetes mellitus without complications: Secondary | ICD-10-CM

## 2018-12-31 DIAGNOSIS — R5383 Other fatigue: Secondary | ICD-10-CM

## 2018-12-31 MED ORDER — HYDROCHLOROTHIAZIDE 12.5 MG PO CAPS: 12.5000 mg | ORAL_CAPSULE | Freq: Every day | ORAL | 3 refills | Status: DC

## 2018-12-31 MED ORDER — CITALOPRAM HYDROBROMIDE 10 MG PO TABS: 10.0000 mg | ORAL_TABLET | Freq: Every day | ORAL | 3 refills | Status: DC

## 2018-12-31 NOTE — Progress Notes (Signed)
   Subjective:    Patient ID: [name], female    DOB: 1932-01-01, 85 y.o.   MRN: [id number]  HPI Patient arrives to discuss blood pressure. Patient thinks her medications are making her feel bad and unable to function and eat. Patient is having significant troubles feeling rundown fatigue tired she worries that she is going to get COVID she also states her sugars have at times been up plus also states her blood pressure is been elevated at times she finds her self feeling stressed and also finding herself feeling depressed no wheezing difficulty breathing vomiting or diarrhea.  Not wanting to eat family helping her out she gets some social contact Our BP 148/86  BP with patient's machine 162/83  Review of Systems  Constitutional: Positive for fatigue. Negative for activity change, appetite change and fever.  HENT: Negative for congestion and rhinorrhea.   Respiratory: Negative for cough and shortness of breath.   Cardiovascular: Negative for chest pain and leg swelling.  Gastrointestinal: Negative for abdominal pain and diarrhea.  Endocrine: Negative for polydipsia and polyphagia.  Skin: Negative for color change.  Neurological: Positive for light-headedness. Negative for dizziness and weakness.  Psychiatric/Behavioral: Negative for behavioral problems and confusion.       Objective:   Physical Exam Vitals signs reviewed.  Constitutional:      General: She is not in acute distress. HENT:     Head: Normocephalic and atraumatic.  Eyes:     General:        Right eye: No discharge.        Left eye: No discharge.  Neck:     Trachea: No tracheal deviation.  Cardiovascular:     Rate and Rhythm: Normal rate and regular rhythm.     Heart sounds: Normal heart sounds. No murmur.  Pulmonary:     Effort: Pulmonary effort is normal. No respiratory distress.     Breath sounds: Normal breath sounds.  Lymphadenopathy:     Cervical: No cervical adenopathy.  Skin:    General: Skin is  warm and dry.  Neurological:     Mental Status: She is alert.     Coordination: Coordination normal.  Psychiatric:        Behavior: Behavior normal.          25 minutes was spent with the patient.  This statement verifies that 25 minutes was indeed spent with the patient.  More than 50% of this visit-total duration of the visit-was spent in counseling and coordination of care. The issues that the patient came in for today as reflected in the diagnosis (s) please refer to documentation for further details.  Assessment & Plan:  Depression-this is been made worse by the isolation related to COVID avoidance we talked about various scenarios I would recommend low-dose citalopram 1 daily follow-up in 3 to 4 weeks  Blood pressure poor control at times not tolerating the blood pressure medicine taper off of hydralazine start low-dose HCTZ  Check labs today regarding diabetes CBC and kidney function  We will follow-up the patient in a few weeks Her blood pressure machine does read a little higher on the diastolic than ours does systolic numbers are fairly close  Blood pressure was checked both with our cuff and her cuff

## 2019-01-01 LAB — CBC WITH DIFFERENTIAL/PLATELET
Basophils Absolute: 0 10*3/uL (ref 0.0–0.2)
Basos: 0 %
EOS (ABSOLUTE): 0.1 10*3/uL (ref 0.0–0.4)
Eos: 0 %
Hematocrit: 44.3 % (ref 34.0–46.6)
Hemoglobin: 14.6 g/dL (ref 11.1–15.9)
Immature Grans (Abs): 0 10*3/uL (ref 0.0–0.1)
Immature Granulocytes: 0 %
Lymphocytes Absolute: 2.1 10*3/uL (ref 0.7–3.1)
Lymphs: 18 %
MCH: 29.1 pg (ref 26.6–33.0)
MCHC: 33 g/dL (ref 31.5–35.7)
MCV: 88 fL (ref 79–97)
Monocytes Absolute: 0.8 10*3/uL (ref 0.1–0.9)
Monocytes: 7 %
Neutrophils Absolute: 8.7 10*3/uL — ABNORMAL HIGH (ref 1.4–7.0)
Neutrophils: 75 %
Platelets: 418 10*3/uL (ref 150–450)
RBC: 5.01 x10E6/uL (ref 3.77–5.28)
RDW: 13.3 % (ref 11.7–15.4)
WBC: 11.7 10*3/uL — ABNORMAL HIGH (ref 3.4–10.8)

## 2019-01-01 LAB — HEMOGLOBIN A1C
Est. average glucose Bld gHb Est-mCnc: 146 mg/dL
Hgb A1c MFr Bld: 6.7 % — ABNORMAL HIGH (ref 4.8–5.6)

## 2019-01-01 LAB — BASIC METABOLIC PANEL
BUN/Creatinine Ratio: 20 (ref 12–28)
BUN: 17 mg/dL (ref 8–27)
CO2: 23 mmol/L (ref 20–29)
Calcium: 10.4 mg/dL — ABNORMAL HIGH (ref 8.7–10.3)
Chloride: 94 mmol/L — ABNORMAL LOW (ref 96–106)
Creatinine, Ser: 0.84 mg/dL (ref 0.57–1.00)
GFR calc Af Amer: 73 mL/min/{1.73_m2} (ref >59)
GFR calc non Af Amer: 64 mL/min/{1.73_m2} (ref >59)
Glucose: 132 mg/dL — ABNORMAL HIGH (ref 65–99)
Potassium: 5 mmol/L (ref 3.5–5.2)
Sodium: 135 mmol/L (ref 134–144)

## 2019-01-01 LAB — TSH: TSH: 1.95 u[IU]/mL (ref 0.450–4.500)

## 2019-01-04 ENCOUNTER — Telehealth: Payer: Self-pay | Admitting: Family Medicine

## 2019-01-04 ENCOUNTER — Other Ambulatory Visit: Payer: Self-pay | Admitting: *Deleted

## 2019-01-04 DIAGNOSIS — D72829 Elevated white blood cell count, unspecified: Secondary | ICD-10-CM

## 2019-01-04 MED ORDER — HYDROCHLOROTHIAZIDE 25 MG PO TABS: 25.0000 mg | ORAL_TABLET | Freq: Every day | ORAL | 5 refills | Status: DC

## 2019-01-04 NOTE — Telephone Encounter (Signed)
I would recommend bumping up the HCTZ New dose 25 mg daily She currently has a 12.5 mg she can take to these daily at the same time (Therefore today she can take 1 additional 12.5 mg and starting tomorrow morning each morning which will equal 25 mg) Then send in a new prescription for 25 mg, #30, 1 every morning, 3 refills Ideally I would like to avoid being back on hydralazine because I believe that that is contributing to how lousy she is feeling I would recommend not checking her blood pressure more than twice per day Give [place] an update in 2 to 3 days It is fine to stay off of the Celexa for now

## 2019-01-04 NOTE — Telephone Encounter (Signed)
Pt states she talked with dr Richardson Landry on Saturday and he told her to take losartan in around 5:30 in the evening instead of the morning and she wants to know if she should go back to taking it in the morning with hctz and amlodipine. Her bp today was 174/87 pulse 78 before her meds this morning at 8am and then took her meds and checked bp again at 10:30 and it was 179/85 pulse was 97. She states she feels fine besides her heart pounding and she took xanax 0.25mg  one half tablet and it stopped pounding. States she would like to hold off on trying celexa for now and just continue to take xanax 0.25mg  one half bid. It seems to be working well for her and she doesn't want to start a new med until she gets her bp straight.

## 2019-01-04 NOTE — Telephone Encounter (Signed)
Discussed with pt. Pt verbalized understanding of all. New dose of hctz sent to pharm.

## 2019-01-04 NOTE — Telephone Encounter (Signed)
See other call from today and lab results. I spoke with pt.

## 2019-01-04 NOTE — Telephone Encounter (Signed)
Patient is wanting  Results of labs and spoke with Dr. Richardson Landry over the weekend about her medications and she wants to discuss with you about the changes.

## 2019-01-04 NOTE — Telephone Encounter (Signed)
Pt needs to talk to Dr. Nicki Reaper when she gets a call about lab work. She had to call Dr. Richardson Landry Thursday night about her medication and he changed it a bit and told her to discuss it with Dr. Nicki Reaper.

## 2019-01-05 DIAGNOSIS — B351 Tinea unguium: Secondary | ICD-10-CM | POA: Diagnosis not present

## 2019-01-06 ENCOUNTER — Other Ambulatory Visit: Payer: Self-pay

## 2019-01-06 ENCOUNTER — Ambulatory Visit (INDEPENDENT_AMBULATORY_CARE_PROVIDER_SITE_OTHER): Payer: Medicare Other | Admitting: Family Medicine

## 2019-01-06 ENCOUNTER — Encounter: Payer: Self-pay | Admitting: Family Medicine

## 2019-01-06 VITALS — Temp 97.0°F | Wt 157.8 lb

## 2019-01-06 DIAGNOSIS — I10 Essential (primary) hypertension: Secondary | ICD-10-CM | POA: Diagnosis not present

## 2019-01-06 DIAGNOSIS — D72829 Elevated white blood cell count, unspecified: Secondary | ICD-10-CM | POA: Diagnosis not present

## 2019-01-06 DIAGNOSIS — F411 Generalized anxiety disorder: Secondary | ICD-10-CM

## 2019-01-06 NOTE — Progress Notes (Signed)
Subjective:    Patient ID: [name], female    DOB: 11/21/1931, 85 y.o.   MRN: [id number]  HPI Pt here today to discuss HCTZ medication. Pt states that she has had diarrhea today. Pt states that she has been checking her BP twice a day. Pt states that she feels like she is "not with it". Pt states she was confused on Monday but that got better during the day.  Patient is checking her blood pressure twice a day she states that her focus and her follow-through does not seem to be as good she feels that HCTZ is causing her problems she recently came off of hydralazine she states she is fearful of having a stroke she also states blood pressure at times is more elevated than what she thinks should be she denies any chest tightness shortness of breath denies unilateral numbness weakness she finds her self feeling down not feeling good every single day she is tried several antidepressants briefly only to stop them she does take Xanax low-dose twice per day does not cause drowsiness  Review of Systems  Constitutional: Negative for activity change, appetite change and fatigue.  HENT: Negative for congestion.   Respiratory: Negative for cough.   Cardiovascular: Negative for chest pain.  Gastrointestinal: Negative for abdominal pain.  Skin: Negative for color change.  Neurological: Negative for headaches.  Psychiatric/Behavioral: Negative for behavioral problems.       Objective:   Physical Exam Vitals signs reviewed.  Constitutional:      General: She is not in acute distress. HENT:     Head: Normocephalic and atraumatic.  Eyes:     General:        Right eye: No discharge.        Left eye: No discharge.  Neck:     Trachea: No tracheal deviation.  Cardiovascular:     Rate and Rhythm: Normal rate and regular rhythm.     Heart sounds: Normal heart sounds. No murmur.  Pulmonary:     Effort: Pulmonary effort is normal. No respiratory distress.     Breath sounds: Normal breath sounds.   Lymphadenopathy:     Cervical: No cervical adenopathy.  Skin:    General: Skin is warm and dry.  Neurological:     Mental Status: She is alert.     Coordination: Coordination normal.  Psychiatric:        Behavior: Behavior normal.           Assessment & Plan:  HTN This is mainly systolic high blood pressure I would recommend currently the patient should not worry about blood pressures that are in the 140s or even the low 150s given her age if the blood pressures are in the 160s to 170s we will go up on the amlodipine.  Stop HCTZ I believe the patient also has some underlying depression but she does not want to start on any medication we will recheck her again in 10 to 12 days she will check blood pressure twice per day she will follow-up she will bring her blood pressure cuff to compare last time we compared it was fairly accurate on the systolic  Her son wonders if she might do better in assisted living I certainly think that if her cognitive issues continue she may well be better off in assisted living on her follow-up we will do a [place] cognitive assessment  She will do a follow-up on leukocytosis and calcium she would do that blood work  before she follows up again  Reassurance given to the patient.  25 minutes was spent with the patient.  This statement verifies that 25 minutes was indeed spent with the patient.  More than 50% of this visit-total duration of the visit-was spent in counseling and coordination of care. The issues that the patient came in for today as reflected in the diagnosis (s) please refer to documentation for further details.  If the patient has an emergency she can call the after-hours line if she has routine questions or concerns I encouraged her to call the office during regular hours

## 2019-01-07 ENCOUNTER — Ambulatory Visit: Payer: Medicare Other | Admitting: Family Medicine

## 2019-01-11 ENCOUNTER — Telehealth: Payer: Self-pay | Admitting: Family Medicine

## 2019-01-11 NOTE — Telephone Encounter (Signed)
Pt's son [name] calling in regarding [name] ahving burning in stomach. He would like to know if the antiacid she is on now and prescribed last week should already be helping with that or if it takes more time to get in her system.   He would like to know if she needs something else called in.   He said we may call her back directly to discuss this or call him.   [place]

## 2019-01-11 NOTE — Telephone Encounter (Signed)
Left message to return call 

## 2019-01-11 NOTE — Telephone Encounter (Signed)
I saw pepcid 20 bid pt can take omeprazole 20 otc in additon to this for a couple of weeks to get things jump started

## 2019-01-12 DIAGNOSIS — D72829 Elevated white blood cell count, unspecified: Secondary | ICD-10-CM | POA: Diagnosis not present

## 2019-01-12 NOTE — Telephone Encounter (Signed)
Discussed with pt. pt verbalized understanding.

## 2019-01-13 LAB — BASIC METABOLIC PANEL
BUN/Creatinine Ratio: 17 (ref 12–28)
BUN: 12 mg/dL (ref 8–27)
CO2: 26 mmol/L (ref 20–29)
Calcium: 10.6 mg/dL — ABNORMAL HIGH (ref 8.7–10.3)
Chloride: 95 mmol/L — ABNORMAL LOW (ref 96–106)
Creatinine, Ser: 0.71 mg/dL (ref 0.57–1.00)
GFR calc Af Amer: 90 mL/min/{1.73_m2} (ref >59)
GFR calc non Af Amer: 78 mL/min/{1.73_m2} (ref >59)
Glucose: 109 mg/dL — ABNORMAL HIGH (ref 65–99)
Potassium: 4.8 mmol/L (ref 3.5–5.2)
Sodium: 138 mmol/L (ref 134–144)

## 2019-01-13 LAB — CBC WITH DIFFERENTIAL/PLATELET
Basophils Absolute: 0 10*3/uL (ref 0.0–0.2)
Basos: 0 %
EOS (ABSOLUTE): 0.1 10*3/uL (ref 0.0–0.4)
Eos: 1 %
Hematocrit: 42.3 % (ref 34.0–46.6)
Hemoglobin: 14.3 g/dL (ref 11.1–15.9)
Immature Grans (Abs): 0.1 10*3/uL (ref 0.0–0.1)
Immature Granulocytes: 1 %
Lymphocytes Absolute: 1.9 10*3/uL (ref 0.7–3.1)
Lymphs: 19 %
MCH: 29.2 pg (ref 26.6–33.0)
MCHC: 33.8 g/dL (ref 31.5–35.7)
MCV: 87 fL (ref 79–97)
Monocytes Absolute: 0.6 10*3/uL (ref 0.1–0.9)
Monocytes: 6 %
Neutrophils Absolute: 7.4 10*3/uL — ABNORMAL HIGH (ref 1.4–7.0)
Neutrophils: 73 %
Platelets: 351 10*3/uL (ref 150–450)
RBC: 4.89 x10E6/uL (ref 3.77–5.28)
RDW: 13.1 % (ref 11.7–15.4)
WBC: 10.1 10*3/uL (ref 3.4–10.8)

## 2019-01-13 LAB — VITAMIN D 25 HYDROXY (VIT D DEFICIENCY, FRACTURES): Vit D, 25-Hydroxy: 34.9 ng/mL (ref 30.0–100.0)

## 2019-01-13 LAB — PTH, INTACT AND CALCIUM: PTH: 22 pg/mL (ref 15–65)

## 2019-01-13 NOTE — Progress Notes (Signed)
pt

## 2019-01-14 NOTE — Addendum Note (Signed)
Addended by: Dairl Ponder on: 01/14/2019 10:12 AM   Modules accepted: Orders

## 2019-01-18 ENCOUNTER — Other Ambulatory Visit: Payer: Self-pay

## 2019-01-18 ENCOUNTER — Ambulatory Visit (INDEPENDENT_AMBULATORY_CARE_PROVIDER_SITE_OTHER): Payer: Medicare Other | Admitting: Family Medicine

## 2019-01-18 VITALS — BP 130/62 | Temp 98.0°F | Wt 157.0 lb

## 2019-01-18 DIAGNOSIS — K219 Gastro-esophageal reflux disease without esophagitis: Secondary | ICD-10-CM | POA: Diagnosis not present

## 2019-01-18 DIAGNOSIS — I10 Essential (primary) hypertension: Secondary | ICD-10-CM

## 2019-01-18 MED ORDER — AMLODIPINE BESYLATE 5 MG PO TABS: 5.0000 mg | ORAL_TABLET | Freq: Every day | ORAL | 5 refills | Status: DC

## 2019-01-18 MED ORDER — FAMOTIDINE 20 MG PO TABS: 20.0000 mg | ORAL_TABLET | Freq: Two times a day (BID) | ORAL | 6 refills | Status: DC

## 2019-01-18 MED ORDER — ALPRAZOLAM 0.25 MG PO TABS: ORAL_TABLET | ORAL | 5 refills | Status: DC

## 2019-01-18 NOTE — Progress Notes (Addendum)
   Subjective:    Patient ID: [name], female    DOB: Jan 08, 1932, 85 y.o.   MRN: [id number]  Hypertension This is a chronic problem. The current episode started more than 1 year ago. Pertinent negatives include no chest pain or shortness of breath. Risk factors for coronary artery disease include post-menopausal state and sedentary lifestyle. Treatments tried: norvasc, cozaar, hctz.    Patient has been trying hard to watch her diet we did discuss she can liberalize getting more fruits and vegetables into her diet  Patient feeling better now that she is off hydralazine  Review of Systems  Constitutional: Negative for activity change, appetite change and fatigue.  HENT: Negative for congestion and rhinorrhea.   Respiratory: Negative for cough and shortness of breath.   Cardiovascular: Negative for chest pain and leg swelling.  Gastrointestinal: Negative for abdominal pain and diarrhea.  Endocrine: Negative for polydipsia and polyphagia.  Skin: Negative for color change.  Neurological: Negative for dizziness and weakness.  Psychiatric/Behavioral: Negative for behavioral problems and confusion.       Objective:   Physical Exam Vitals signs reviewed.  Constitutional:      General: She is not in acute distress. HENT:     Head: Normocephalic and atraumatic.  Eyes:     General:        Right eye: No discharge.        Left eye: No discharge.  Neck:     Trachea: No tracheal deviation.  Cardiovascular:     Rate and Rhythm: Normal rate and regular rhythm.     Heart sounds: Normal heart sounds. No murmur.  Pulmonary:     Effort: Pulmonary effort is normal. No respiratory distress.     Breath sounds: Normal breath sounds.  Lymphadenopathy:     Cervical: No cervical adenopathy.  Skin:    General: Skin is warm and dry.  Neurological:     Mental Status: She is alert.     Coordination: Coordination normal.  Psychiatric:        Behavior: Behavior normal.            Assessment & Plan:  HTN Amlodipine she is using 5 mg continue the lower Sartin 100 mg continue diuretic follow-up in 2 months follow-up sooner a particular problems or issues  Recommend GERD medication famotidine twice daily minimize tomato based products  Patient handling the stress of isolation relatively well I encourage her to eat enough to keep her weight up  Diabetes under good control  She does have significant amount of stress we talked about how to best keep that under control she may use the Xanax up to a half a tablet 3 times a day when necessary  Follow-up 2 months

## 2019-01-21 LAB — PTH-RELATED PEPTIDE: PTH-related peptide: <2 pmol/L

## 2019-01-25 ENCOUNTER — Other Ambulatory Visit: Payer: Self-pay | Admitting: *Deleted

## 2019-01-25 ENCOUNTER — Telehealth: Payer: Self-pay | Admitting: Family Medicine

## 2019-01-25 ENCOUNTER — Telehealth: Payer: Self-pay | Admitting: *Deleted

## 2019-01-25 MED ORDER — ALPRAZOLAM 0.25 MG PO TABS: 0.2500 mg | ORAL_TABLET | Freq: Two times a day (BID) | ORAL | 1 refills | Status: DC | PRN

## 2019-01-25 NOTE — Telephone Encounter (Signed)
See lab result notes thank you

## 2019-01-25 NOTE — Telephone Encounter (Signed)
Discussed with pt and she states she will let you know after the bw comes back if she wants to move up appt.

## 2019-01-25 NOTE — Telephone Encounter (Signed)
Discussed with pt and new dose sent to pharm. Pt states if she keeps feeling this way she will want you to sign papers for her to go to a nursing home. She will call back and let you know when she thinks she is ready.

## 2019-01-25 NOTE — Telephone Encounter (Signed)
Patient would like results of labs.

## 2019-01-25 NOTE — Telephone Encounter (Signed)
See result note.  

## 2019-01-25 NOTE — Telephone Encounter (Signed)
May change her Xanax to 0.25 mg, 1 tablet twice daily, #60, 1 refill hopefully she can do her lab work later this week.  Obviously if patient continues to feel bad to notify us

## 2019-01-25 NOTE — Telephone Encounter (Signed)
Called pt to give result note and she states she is not doing well. Having a lot of fatigue and just laying on the couch all day. She is going to go get the bw done you ordered but wants to know if you can change her xanax 0.25mg  from one half tid to one whole tablet bid. States what she is taking is just not enough. She would like a call back today.  [facility] [place].

## 2019-01-25 NOTE — Telephone Encounter (Signed)
Please check with patient let her know if she would like to we can move up her follow-up visit to be in 1 to 2 weeks time rather than wait till September

## 2019-01-27 DIAGNOSIS — I10 Essential (primary) hypertension: Secondary | ICD-10-CM | POA: Diagnosis not present

## 2019-01-27 DIAGNOSIS — R5381 Other malaise: Secondary | ICD-10-CM | POA: Diagnosis not present

## 2019-01-27 DIAGNOSIS — R457 State of emotional shock and stress, unspecified: Secondary | ICD-10-CM | POA: Diagnosis not present

## 2019-01-28 ENCOUNTER — Ambulatory Visit: Payer: Medicare Other | Admitting: Family Medicine

## 2019-01-28 ENCOUNTER — Telehealth: Payer: Self-pay | Admitting: *Deleted

## 2019-01-28 ENCOUNTER — Other Ambulatory Visit (HOSPITAL_COMMUNITY): Payer: Self-pay | Admitting: Family Medicine

## 2019-01-28 DIAGNOSIS — Z1231 Encounter for screening mammogram for malignant neoplasm of breast: Secondary | ICD-10-CM

## 2019-01-28 NOTE — Telephone Encounter (Signed)
1.  The test that we are doing is to look for reasons why her calcium is not where it should be I do not feel that that is the source of all her problems 2.  As for helping her set up with the nursing home I will need your help nurses- please find out from nursing home if they need a [place] 2 filled out more than likely in my opinion the patient would be more likely to benefit from assisted living not nursing home You can put this on hold for just a moment until I speak with the son Please let the son know that unfortunately I am thoroughly overbooked throughout the rest of the day I can call him closer to suppertime that is the best I can do otherwise it will be tomorrow  As for the patient please call the patient back explained to her that we are trying to work on things as best as possible give her reassurance  Once you handle this send it back

## 2019-01-28 NOTE — Telephone Encounter (Signed)
Pt's son [name] (he is on dpr) called and concerned about his mother. He had to call 911 last night because she was just worn out, no energy, could not get off of couch. EMS checked her and told her they would take her to hospital if she wanted to go but pt declined. Son states it might be worse going to ED right now. States she is worried about the bloodwork that was done and what dr Nicki Reaper is looking for. Wants to know if lexapro would help her. While on the phone with [name] pt called in on other line and I talked with her. She states she is feeling better today. Yesterday couldn't eat. States EMS suggested talking to doctor about taking xanax 0.25mg  tid instead of bid. She just started the increase dose of bid yesterday. She states she is ready to get paperwork started to go to assisted living or nursing home. States she was told she would have to be able not to do two things before she qualified and she states she is not able to give her self a bath anymore and she has trouble with taking her meds. She would like to go to [facility] center if possible and wanted to know if bw results were back I told her some were still pending. Pt states she was told she needed to do visit after bw was back.   Son would like a call back [phone] And also pt would like a call back. [phone].

## 2019-01-28 NOTE — Telephone Encounter (Signed)
error 

## 2019-01-28 NOTE — Telephone Encounter (Signed)
Discussed with Ms Zacher and her son [name]. Ms. Bundick states she will call her insurance and see if she qualifies for assisted living and if so she would like to go to [facility]. Son [name] states to call [phone] this evening.

## 2019-01-28 NOTE — Telephone Encounter (Signed)
Pt's son [name] (he is on dpr) called and concerned about his mother. He had to call 911 last night because she was just worn out, no energy, could not get off of couch. EMS checked her and told her they would take her to hospital if she wanted to go but pt declined. Son states it might be worse going to ED right now. States she is worried about the bloodwork that was done and what dr Nicki Reaper is looking for. Wants to know if lexapro would help her. While on the phone with [name] pt called in on other line and I talked with her. She states she is feeling better today. Yesterday couldn't eat. States EMS suggested talking to doctor about taking xanax 0.25mg  tid instead of bid. She just started the increase dose of bid yesterday. She states she is ready to get paperwork started to go to assisted living or nursing home. States she was told she would have to be able not to do two things before she qualified and she states she is not able to give her self a bath anymore and she has trouble with taking her meds. She would like to go to [facility] center if possible and wanted to know if bw results were back I told her some were still pending. Pt states she was told she needed to do visit after bw was back. Next appt is 9/22.   Son would like a call back [phone] And also pt would like a call back. [phone]

## 2019-01-29 ENCOUNTER — Telehealth: Payer: Self-pay | Admitting: Family Medicine

## 2019-01-29 NOTE — Telephone Encounter (Signed)
Please let the patient know that her calcium came back normal there are a another test that are pending but I am not worried about these so I encouraged her to rest well over the weekend  Some of these test are very specific and take multiple days to get back I encouraged her to add patients but the fact that her calcium came back as normal is a very good sign

## 2019-01-29 NOTE — Telephone Encounter (Signed)
Discussed with pt. Pt verbalized understanding. She also wanted to know about increasing her xanax 0.25mg  from one bid to one tid.

## 2019-01-29 NOTE — Telephone Encounter (Signed)
Discussed with pt. Pt verbalized understanding.  °

## 2019-01-29 NOTE — Telephone Encounter (Signed)
I did have discussion with the son they are interested in possible assisted living at [facility] may have some sort of long-term care insurance through [facility] insurance in [place]  Please call [employer] please ask them to send to [place] what criteria the patient would need in order to be able to go into assisted living such as [facility]?  For is her insurance only good for nursing home?  Does this process require any type of home health nurse evaluation etc. the more information that the insurance agent can send to [place] that he is here it is to help the patient

## 2019-01-29 NOTE — Telephone Encounter (Signed)
Unfortunately this is a slippery slope  If need be the patient may use up to 3 a day but ideally I would like for her to try to do other ways of coping such as listening to music and watching TV talking to friends  This particular medication can become a medication she could develop tolerance to or even develop as a habit that she would find it very hard to break

## 2019-01-29 NOTE — Telephone Encounter (Signed)
Pt is checking on lab results. She is hoping to know results today. She states she would hate to go through the whole weekend.

## 2019-01-29 NOTE — Telephone Encounter (Signed)
Called pt and got her insurance info. She states they are closed on fridays so not able to call today. It is [employer] in [place]. Phone number [phone].

## 2019-01-30 LAB — PE AND FLC, SERUM
A/G Ratio: 1.5 (ref 0.7–1.7)
Albumin ELP: 4 g/dL (ref 2.9–4.4)
Alpha 1: 0.2 g/dL (ref 0.0–0.4)
Alpha 2: 0.8 g/dL (ref 0.4–1.0)
Beta: 1 g/dL (ref 0.7–1.3)
Gamma Globulin: 0.6 g/dL (ref 0.4–1.8)
Globulin, Total: 2.7 g/dL (ref 2.2–3.9)
Ig Kappa Free Light Chain: 24.3 mg/L — ABNORMAL HIGH (ref 3.3–19.4)
Ig Lambda Free Light Chain: 12.2 mg/L (ref 5.7–26.3)
Kappa/Lambda FluidC Ratio: 1.99 — ABNORMAL HIGH (ref 0.26–1.65)
Total Protein: 6.7 g/dL (ref 6.0–8.5)

## 2019-01-30 LAB — CALCIUM: Calcium: 9.9 mg/dL (ref 8.7–10.3)

## 2019-01-30 LAB — VITAMIN D 1,25 DIHYDROXY
Vitamin D 1, 25 (OH)2 Total: 41 pg/mL
Vitamin D2 1, 25 (OH)2: <10 pg/mL
Vitamin D3 1, 25 (OH)2: 41 pg/mL

## 2019-01-30 LAB — PROTEIN ELECTROPHORESIS, SERUM

## 2019-02-01 NOTE — Telephone Encounter (Signed)
The [employer] insurance states that patient has a policy that will pay $233 dollars a day or $4500 a month for assisted living or nursing home care. Any above tat would fall to the patient.  Patient needs a letter stating she is isn heed of care and has difficulty with at least 3 daily activities including sower dressing med administration etc. Patient states she has trouble with everything. Patient would need the letter from the doctor and then she will have to submit it to her insurance and wait for approval and instructions per insurance company.

## 2019-02-04 ENCOUNTER — Other Ambulatory Visit: Payer: Self-pay | Admitting: Family Medicine

## 2019-02-04 ENCOUNTER — Telehealth: Payer: Self-pay | Admitting: Family Medicine

## 2019-02-04 NOTE — Telephone Encounter (Signed)
Patient having ongoing issues with feeling bad, fatigue, doesn't want to eat- will have a good day followed by multiple bad days  Family wants to know where they are in the process of [place] getting her into [facility]

## 2019-02-04 NOTE — Telephone Encounter (Signed)
Son called - concerned that pt is not feeling well last couple days.  Diarrhea & fatigue  Son would like the nurse to call & speak with the pt   Been going on for a couple weeks, has good & bad days - states Dr. Nicki Reaper is aware of all this   Martin Majestic to the dentist Tuesday - felt pretty good but yesterday & today, fatigued, not getting up off the couch much  Suggestions?  Also son wanted to check to see where we were at with Dr. Nicki Reaper calling [facility] about getting about possibly getting pt in to [facility]

## 2019-02-05 ENCOUNTER — Other Ambulatory Visit: Payer: Self-pay

## 2019-02-05 ENCOUNTER — Ambulatory Visit (INDEPENDENT_AMBULATORY_CARE_PROVIDER_SITE_OTHER): Payer: Medicare Other | Admitting: Family Medicine

## 2019-02-05 ENCOUNTER — Encounter: Payer: Self-pay | Admitting: Family Medicine

## 2019-02-05 DIAGNOSIS — R54 Age-related physical debility: Secondary | ICD-10-CM

## 2019-02-05 DIAGNOSIS — E119 Type 2 diabetes mellitus without complications: Secondary | ICD-10-CM | POA: Diagnosis not present

## 2019-02-05 DIAGNOSIS — R634 Abnormal weight loss: Secondary | ICD-10-CM | POA: Diagnosis not present

## 2019-02-05 DIAGNOSIS — F411 Generalized anxiety disorder: Secondary | ICD-10-CM | POA: Diagnosis not present

## 2019-02-05 DIAGNOSIS — I10 Essential (primary) hypertension: Secondary | ICD-10-CM | POA: Diagnosis not present

## 2019-02-05 NOTE — Telephone Encounter (Signed)
I was able to do a virtual visit with the patient this morning Please have the front/medical records fax a copy of today's visit to [facility]  Also patient needs TB test I offered her the blood TB test quant to [place] or the under the skin standard TB test with follow-up 48 hours later She has asked that the nurses call her son and explained that she will need TB test before going into [facility] assisted living I did fill out [place] 2 form The son will need the original [place] 2 form to bring to them The son may also have a copy of her office visit for today that he can drop it by [employer] if he would like (I called over there today to get their fax number and all I got was an answering machine)  If they need anything else please let us know

## 2019-02-05 NOTE — Progress Notes (Signed)
Virtual Visit via Video Note Telephone visit only patient did not have access to video I connected with [name] on 02/05/19 at  9:00 AM EDT by a video enabled telemedicine application and verified that I am speaking with the correct person using two identifiers.  Location: Patient: Home Provider: Office   I discussed the limitations of evaluation and management by telemedicine and the availability of in person appointments. The patient expressed understanding and agreed to proceed.  History of Present Illness: Patient relates that she had onset over the past few days of feeling fatigued tired rundown in addition to this had little to no energy.  Pretty much is been very difficult for this patient recently she has had a decline in her health over the past several months.  She has a hard time fixing food for herself she states if it was not for other people bringing her food she would not have any.  So therefore she cannot fix her own food.  She finds it very difficult to manage her medications.  She writes things down but then she has a hard time organizing and making sure she takes her medicines on a regular basis and she is concerned that she is missing doses or taking her medicine was the wrong way. Also the patient has had significant fatigue and frailty with decline in muscle strength to the point where she has not been able to take a shower for several weeks she does washcloth bathing of her face and she is at the point where she needs help with hygiene  This patient does have underlying history of  Diabetes hypertension Observations/Objective: Today's visit was via telephone Physical exam was not possible for this visit 15 minutes was spent with patient today discussing healthcare issues which they came.  More than 50% of this visit-total duration of visit-was spent in counseling and coordination of care.  Please see diagnosis regarding the focus of this coordination and care This  patient also states that she has been losing weight because of inability to fix food.  Assessment and Plan: Blood pressure under decent control Diabetes under good control Fatigue tiredness decline in health and strength. Frailty- a product of her age as well as multiple health issues Weight loss-a product of inability to fix her own food Generalized anxiety disorder-having to use Xanax up to 3 times a day but I believe a lot of this is related to [place] much staying at home always in little social interaction- once she gets her ADLs taking care of by the assisted living I believe her overall mental health status will improve Patient now needs assistance with bathing-she is unable to help herself in this ADL Patient unable to care for herself with medications- patient has difficult time managing and organizing her medications and would benefit from having a trained medical assistant helping her with this Patient also is unable to fix food.  Family currently is bringing ready to eat food multiple times per day.  The patient would benefit from having food preparation for her on a regular basis because of her inability to do this ADL  In my opinion this patient can no longer stay at home by herself.  Her age and debilitation have led to impairment of the above ADLs.  She is also having some difficulty thinking through processes and based on all of this it would be dangerous for her to stay by herself at home any longer.  I have advised her that  she ought to go into assisted living facility to help her with these ADL deficits and also be able to provide her safety for which she does not have in her current situation.  I do believe that this is a permanent condition.  She also recently has had problems with hypercalcemia.  We have worked this up as thoroughly as we can.  Her light chains are slightly elevated.  We will communicate with hematology oncology to see if they feel that it would  be beneficial for the patient to have this looked into further.  She will also need a TB test we will bring her in next week so that we will get her ready to go to the assisted living.  FL 2 form was filled out for the patient.   Follow Up Instructions:    I discussed the assessment and treatment plan with the patient. The patient was provided an opportunity to ask questions and all were answered. The patient agreed with the plan and demonstrated an understanding of the instructions.   The patient was advised to call back or seek an in-person evaluation if the symptoms worsen or if the condition fails to improve as anticipated.  I provided 17 minutes of non-face-to-face time during this encounter.   Sallee Lange, MD

## 2019-02-05 NOTE — Telephone Encounter (Signed)
I would like to do a phone consultation with the patient on Friday morning please connect with the patient Please put her on the schedule thank you

## 2019-02-05 NOTE — Progress Notes (Signed)
   Subjective:    Patient ID: [name], female    DOB: 07-Feb-1932, 85 y.o.   MRN: [id number]  HPI   Patient calls to talk with doctor.  Virtual Visit via Video Note  I connected with [name] on 02/05/19 at  9:00 AM EDT by a video enabled telemedicine application and verified that I am speaking with the correct person using two identifiers.  Location: Patient: home Provider: office   I discussed the limitations of evaluation and management by telemedicine and the availability of in person appointments. The patient expressed understanding and agreed to proceed.  History of Present Illness:    Observations/Objective:   Assessment and Plan:   Follow Up Instructions:    I discussed the assessment and treatment plan with the patient. The patient was provided an opportunity to ask questions and all were answered. The patient agreed with the plan and demonstrated an understanding of the instructions.   The patient was advised to call back or seek an in-person evaluation if the symptoms worsen or if the condition fails to improve as anticipated.  I provided 15 minutes of non-face-to-face time during this encounter.     Review of Systems     Objective:   Physical Exam        Assessment & Plan:

## 2019-02-05 NOTE — Telephone Encounter (Signed)
Son (DPR) scheduled TB skin test for patient. Son will pick up the forms and letter to be submitted to the insurance company and [facility].

## 2019-02-09 ENCOUNTER — Other Ambulatory Visit: Payer: Self-pay

## 2019-02-09 ENCOUNTER — Ambulatory Visit (INDEPENDENT_AMBULATORY_CARE_PROVIDER_SITE_OTHER): Payer: Medicare Other

## 2019-02-09 DIAGNOSIS — Z111 Encounter for screening for respiratory tuberculosis: Secondary | ICD-10-CM

## 2019-02-11 ENCOUNTER — Other Ambulatory Visit: Payer: Self-pay | Admitting: *Deleted

## 2019-02-11 LAB — TB SKIN TEST
Induration: 0 mm
TB Skin Test: NEGATIVE

## 2019-02-15 ENCOUNTER — Telehealth: Payer: Self-pay | Admitting: Family Medicine

## 2019-02-15 NOTE — Telephone Encounter (Signed)
Please call pt and schedule phone visit this week. Every one was gone when I spoke to her this afternoon. I told pt someone would give her a call tomorrow to schedule for this week with dr Nicki Reaper. Thanks

## 2019-02-15 NOTE — Telephone Encounter (Signed)
Discussed with pt. Pt verbalized understanding.  °

## 2019-02-15 NOTE — Telephone Encounter (Signed)
So in this particular situation I would recommend Mylanta only when having heartburn or symptoms otherwise I would not utilize it  Pepcid is 20 mg twice daily I believe it would be to patient's advantage for [place] to have her do a follow-up sooner rather than later We can also do referral to gastroenterology (patient may prefer to see [place] first before referral) If patient unable to come to the office I can do a phone consultation with her

## 2019-02-15 NOTE — Telephone Encounter (Signed)
Pt wants to confirm dosage on mylanta and pepcid. She is taking pepcid 20 mg 2 qam and mylanta bottle states to take 10-76ml tid and at bedtime but not to use more than 47ml per day. And how long should she take the mylanta.  she states she is on an incline now she uses two blankets now. She states she coughs all the time at night when laying down and the on call doctor told her to ask dr Wolfgang Phoenix about getting endoscopy.

## 2019-02-15 NOTE — Telephone Encounter (Signed)
Patient was put on pepcid 20 mg but not helping so the weekend she called the help line and they told her to use Mylanta and pepcid to see if it would help and she did. But she wants to know from you is that ok to use both.And also she has to sit up to sleep most nights because if she lays flat ,she starts coughing.Please Advise

## 2019-02-15 NOTE — Telephone Encounter (Signed)
Avoid eating any large meals within 2 hours of bedtime I would also recommend considering buying a wedge so that she sleeps on an incline It is okay to use Mylanta when necessary

## 2019-02-16 NOTE — Telephone Encounter (Signed)
Pt put on schedule

## 2019-02-17 ENCOUNTER — Ambulatory Visit (INDEPENDENT_AMBULATORY_CARE_PROVIDER_SITE_OTHER): Payer: Medicare Other | Admitting: Family Medicine

## 2019-02-17 ENCOUNTER — Encounter: Payer: Self-pay | Admitting: Family Medicine

## 2019-02-17 ENCOUNTER — Other Ambulatory Visit: Payer: Self-pay

## 2019-02-17 VITALS — BP 139/70 | HR 90

## 2019-02-17 DIAGNOSIS — R413 Other amnesia: Secondary | ICD-10-CM | POA: Diagnosis not present

## 2019-02-17 DIAGNOSIS — R5383 Other fatigue: Secondary | ICD-10-CM

## 2019-02-17 DIAGNOSIS — K219 Gastro-esophageal reflux disease without esophagitis: Secondary | ICD-10-CM

## 2019-02-17 DIAGNOSIS — F411 Generalized anxiety disorder: Secondary | ICD-10-CM | POA: Diagnosis not present

## 2019-02-17 NOTE — Progress Notes (Addendum)
Subjective:    Patient ID: [name], female    DOB: Feb 04, 1932, 85 y.o.   MRN: [id number] Extremely nice patient Phone visit unable to do video visit She denies being depressed but she states she feels fatigued tired rundown low energy unable to do much for herself She is having increased forgetfulness.  She states she often has a difficult time operating simple things that she always knew how to operate She is forgetting conversations that occur with family Certainly some symptoms consistent with the possibility of early Alzheimer's which will need further looking into Patient unable to fix food for herself Unable to bathe she states she is not taking a shower or bath and weeks she only does washing of her face Patient also cannot do housework she has had family come in to do this for her In addition to this she is unable to do basic ADLs except for getting dressed most of the time HPI Pt is needing to discuss her medications. Pt was spoken with on phone yesterday regarding Pepcid. Pt states she is taking 20 mg pepcid, one in the morning and one in the evening. Pt states she is getting a wedge today as provider recommended. Pt also states that she will be going to [facility] once she gets everything situated.   Virtual Visit via Video Note  I connected with KHRISTEN CHEYNEY on 02/17/19 at  3:30 PM EDT by a video enabled telemedicine application and verified that I am speaking with the correct person using two identifiers.  Location: Patient: home Provider: office   I discussed the limitations of evaluation and management by telemedicine and the availability of in person appointments. The patient expressed understanding and agreed to proceed.  History of Present Illness:    Observations/Objective:   Assessment and Plan:  No flowsheet data found.  Follow Up Instructions:    I discussed the assessment and treatment plan with the patient. The patient was provided an  opportunity to ask questions and all were answered. The patient agreed with the plan and demonstrated an understanding of the instructions.   The patient was advised to call back or seek an in-person evaluation if the symptoms worsen or if the condition fails to improve as anticipated.  I provided 15 minutes of non-face-to-face time during this encounter.   Vicente Males, LPN    Review of Systems  Constitutional: Positive for fatigue. Negative for activity change and appetite change.  HENT: Negative for congestion and rhinorrhea.   Respiratory: Negative for cough and shortness of breath.   Cardiovascular: Negative for chest pain and leg swelling.  Gastrointestinal: Negative for abdominal pain and diarrhea.  Endocrine: Negative for polydipsia and polyphagia.  Skin: Negative for color change.  Neurological: Negative for dizziness and weakness.  Psychiatric/Behavioral: Negative for behavioral problems and confusion.       Objective:   Physical Exam  Today's visit was via telephone Physical exam was not possible for this visit       Assessment & Plan:  Significant fatigue tiredness feeling rundown Also having some memory dysfunction although not enough to classify as Alzheimer's but worrisome for the possibility of developing this Unable to fend for herself Pretty much patient is having to rely on family for her food, cleaning of household, transportation, and unable to do her own bathing. This patient does need full ongoing assisted care living.  I am hopeful she will get in in the near future  She also had some slight  elevation of light chains but her calcium looks good we will follow her calcium if it gets Aveline she will need to get in with hematology we will communicate with hematology to see if they feel further intervention is necessary

## 2019-02-23 ENCOUNTER — Telehealth: Payer: Self-pay | Admitting: Family Medicine

## 2019-02-23 NOTE — Telephone Encounter (Signed)
Son(DPR) notified per Dr Nicki Reaper: So typically [facility] not like to have PRN dosing Dr Nicki Reaper would recommend that the directions say Pepcid once daily If patient is okay with this we can change it to once a day Son verbalized understanding.

## 2019-02-23 NOTE — Telephone Encounter (Signed)
So typically they[facility]-do not like to have PRN dosing I would recommend that the directions say Pepcid once daily If patient is okay with this we can change it to once a day

## 2019-02-23 NOTE — Telephone Encounter (Signed)
[name] pt's son calling stating that she is going to [facility] today and Tammy there will not change her medication on the PEPCID due to the paper work stating for her to take it twice a day.   He was told today for her to only take one a day but [facility] will not adjust it until they have a doctors order.

## 2019-02-23 NOTE — Telephone Encounter (Signed)
[facility] is calling on patient medications not matching her [place] 2 form. They are needing verification on her medications. Also she brought in two bottles of xanax .25mg  with different directions on bottles which prescription is patient suppose to be taking. They faxed over paper for verification on medication in your box for review. Please Advise and fax to [phone]

## 2019-02-23 NOTE — Telephone Encounter (Signed)
Verbal order given to Tiffany at [facility] to decrease Pepcid to one a day.

## 2019-02-23 NOTE — Telephone Encounter (Signed)
Sounds good

## 2019-02-23 NOTE — Telephone Encounter (Signed)
[employer] faxed over forms that need to be completed by primary doctor.In your box.

## 2019-02-23 NOTE — Telephone Encounter (Signed)
Patient wanted you to know that she is going to [facility] today and wanting you to let them she can only take her pepcid once a day instead of twice a day. She states it upset her stomach when she takes it twice. She wants her prescription to say PRN. Please Advise Call her son [phone]

## 2019-02-28 ENCOUNTER — Telehealth: Payer: Self-pay | Admitting: Family Medicine

## 2019-02-28 NOTE — Telephone Encounter (Signed)
Nurses In order to qualify this patient for her assisted living We need for her to do a [place] cognitive test Please talk with Market researcher at [employer] See if they would be willing to try to get administer a [place] cognitive assessment It may well require you faxing 1 to them then briefly coaching them over the phone to help for the patient to do that Once they completed they need to fax it back to [place] ASAP

## 2019-03-01 NOTE — Telephone Encounter (Signed)
Spoke with Edd Fabian at [facility]. Informed Edd Fabian that pt needs to have a [place] Cognitive test completed in order to qualify her for assistant living. Instructed Gayle on how to help patient complete this test. Faxed over [place] to [place] with directions to fax it back as soon as it is completed.

## 2019-03-01 NOTE — Telephone Encounter (Signed)
[facility] picked up paper work for patient today at 1:30 pm

## 2019-03-02 ENCOUNTER — Telehealth: Payer: Self-pay | Admitting: Family Medicine

## 2019-03-02 NOTE — Telephone Encounter (Signed)
[employer] faxed over forms to be completed by physician on 8/25 and put in your box for review and completion. A representative called today checking on forms. Please Advise.

## 2019-03-04 ENCOUNTER — Encounter: Payer: Self-pay | Admitting: Family Medicine

## 2019-03-04 DIAGNOSIS — R54 Age-related physical debility: Secondary | ICD-10-CM

## 2019-03-04 DIAGNOSIS — F09 Unspecified mental disorder due to known physiological condition: Secondary | ICD-10-CM

## 2019-03-04 HISTORY — DX: Age-related physical debility: R54

## 2019-03-04 HISTORY — DX: Unspecified mental disorder due to known physiological condition: F09

## 2019-03-04 NOTE — Telephone Encounter (Signed)
Insurance papers have been faxed to [employer] on 9/3

## 2019-03-04 NOTE — Telephone Encounter (Signed)
Additional information was filled out as requested-please forward these to the proper representatives

## 2019-03-15 ENCOUNTER — Telehealth: Payer: Self-pay | Admitting: Family Medicine

## 2019-03-15 MED ORDER — ACETAMINOPHEN 500 MG PO TABS: 500.0000 mg | ORAL_TABLET | Freq: Four times a day (QID) | ORAL | 0 refills | Status: DC | PRN

## 2019-03-15 MED ORDER — CEFPROZIL 500 MG PO TABS: 500.0000 mg | ORAL_TABLET | Freq: Four times a day (QID) | ORAL | 0 refills | Status: DC

## 2019-03-15 NOTE — Telephone Encounter (Signed)
[name] at [facility]        Wanted something called in for UTI Patient is having urinary frequency, cloudy urine and slight discomfort when she goes to the bathroom.  [facility]

## 2019-03-15 NOTE — Telephone Encounter (Signed)
[name] also called back to see if they could get a prn order for Tylenol added as well.

## 2019-03-15 NOTE — Telephone Encounter (Signed)
Nurses Cefzil 500 mg 4 times daily for 3 days  Also may have order for Tylenol 500 mg, may take 1 every 6 hours as needed pain, feel free to write this out as an order and I will sign thank you

## 2019-03-15 NOTE — Telephone Encounter (Signed)
Prescriptions sent electronically to [facility]. Erline Levine at [facility] notified.

## 2019-03-18 ENCOUNTER — Other Ambulatory Visit: Payer: Self-pay | Admitting: Family Medicine

## 2019-03-18 DIAGNOSIS — E11319 Type 2 diabetes mellitus with unspecified diabetic retinopathy without macular edema: Secondary | ICD-10-CM | POA: Diagnosis not present

## 2019-03-23 ENCOUNTER — Telehealth: Payer: Self-pay | Admitting: Family Medicine

## 2019-03-23 ENCOUNTER — Encounter: Payer: Self-pay | Admitting: Family Medicine

## 2019-03-23 ENCOUNTER — Other Ambulatory Visit: Payer: Self-pay

## 2019-03-23 ENCOUNTER — Ambulatory Visit (INDEPENDENT_AMBULATORY_CARE_PROVIDER_SITE_OTHER): Payer: Medicare Other | Admitting: Family Medicine

## 2019-03-23 VITALS — BP 122/70 | Temp 97.5°F | Wt 164.2 lb

## 2019-03-23 DIAGNOSIS — Z23 Encounter for immunization: Secondary | ICD-10-CM | POA: Diagnosis not present

## 2019-03-23 DIAGNOSIS — E119 Type 2 diabetes mellitus without complications: Secondary | ICD-10-CM | POA: Diagnosis not present

## 2019-03-23 DIAGNOSIS — F0390 Unspecified dementia without behavioral disturbance: Secondary | ICD-10-CM

## 2019-03-23 DIAGNOSIS — I10 Essential (primary) hypertension: Secondary | ICD-10-CM

## 2019-03-23 DIAGNOSIS — F411 Generalized anxiety disorder: Secondary | ICD-10-CM | POA: Diagnosis not present

## 2019-03-23 MED ORDER — ALPRAZOLAM 0.25 MG PO TABS: 0.2500 mg | ORAL_TABLET | Freq: Three times a day (TID) | ORAL | 5 refills | Status: DC | PRN

## 2019-03-23 MED ORDER — DICYCLOMINE HCL 10 MG PO CAPS: ORAL_CAPSULE | ORAL | 5 refills | Status: DC

## 2019-03-23 NOTE — Telephone Encounter (Signed)
Pharmacist at [facility] needs an actual order for the hemorrhoid cream with a signature on it since it is being provided for someone who is in assisted living.  Also said that it needs to say the frequency on it as well, like 3 times a day as needed.   [id number]

## 2019-03-23 NOTE — Progress Notes (Signed)
Subjective:    Patient ID: [name], female    DOB: [birth year], 85 y.o.   MRN: [id number]  Hypertension This is a chronic problem. Pertinent negatives include no chest pain or shortness of breath. Risk factors for coronary artery disease include diabetes mellitus. There are no compliance problems.   Diabetes She presents for her follow-up diabetic visit. She has type 2 diabetes mellitus. There are no hypoglycemic associated symptoms. Pertinent negatives for hypoglycemia include no confusion or dizziness. There are no diabetic associated symptoms. Pertinent negatives for diabetes include no chest pain, no fatigue, no polydipsia, no polyphagia and no weakness. There are no hypoglycemic complications. There are no diabetic complications. Risk factors for coronary artery disease include hypertension. She is following a generally healthy diet. Her breakfast blood glucose range is generally 110-130 mg/dl.   Pt states that every so often she will feel very fatigued. Pt states this began when she had CDiff. Pt states this also affects her bowels and appetite.  Happens twice a week Soft bm then diarrhea No blood no fever Feel really weak afterwards Last a few hours Patient states that if she lays around for a couple hours it seems to get better and then goes away.  She denies blood in her stool  Patient also relates a lot of forgetfulness forgetting what she said earlier in the day plus also having a difficult time remembering medication she states is been very helpful for her to be at the assisted living because they keep her medication on schedule There also promote healthy eating M fix her 3 meals a day which she was not able to do They are also helping her with her hygiene  Using 3 xanax a day the main reason she feels she needs this is she is having a very difficult time with the stress of not being at home she finds her self feeling anxious she understands the need for her to be at  assisted living and knows that it is unlikely she will ever get to the point that she can live by herself  Appetite doing ok Activity doing well    Review of Systems  Constitutional: Negative for activity change, appetite change and fatigue.  HENT: Negative for congestion and rhinorrhea.   Respiratory: Negative for cough and shortness of breath.   Cardiovascular: Negative for chest pain and leg swelling.  Gastrointestinal: Negative for abdominal pain and diarrhea.  Endocrine: Negative for polydipsia and polyphagia.  Skin: Negative for color change.  Neurological: Negative for dizziness and weakness.  Psychiatric/Behavioral: Negative for behavioral problems and confusion.       Objective:   Physical Exam Vitals signs reviewed.  Constitutional:      General: She is not in acute distress. HENT:     Head: Normocephalic and atraumatic.  Eyes:     General:        Right eye: No discharge.        Left eye: No discharge.  Neck:     Trachea: No tracheal deviation.  Cardiovascular:     Rate and Rhythm: Normal rate and regular rhythm.     Heart sounds: Normal heart sounds. No murmur.  Pulmonary:     Effort: Pulmonary effort is normal. No respiratory distress.     Breath sounds: Normal breath sounds.  Lymphadenopathy:     Cervical: No cervical adenopathy.  Skin:    General: Skin is warm and dry.  Neurological:     Mental Status: She is alert.  Coordination: Coordination normal.  Psychiatric:        Behavior: Behavior normal.     Previous [place] cognitive assessment showed a 21 out of 30      Assessment & Plan:  1. Need for vaccination Flu shot today - Flu Vaccine QUAD 6+ mos PF IM (Fluarix Quad PF) - Pneumococcal polysaccharide vaccine 23-valent greater than or equal to 2yo subcutaneous/IM  2. Essential hypertension Blood pressure good control continue current measures watch diet try to do some walking stay active - Hemoglobin 123456 - Basic Metabolic Panel (BMET)   3. Controlled type 2 diabetes mellitus without complication, without long-term current use of insulin (HCC) A1c ordered previous labs reviewed with patient patient to do the best she can with her Metformin we will watch kidney function closely at one point she will have to come off of metformin - Hemoglobin 123456 - Basic Metabolic Panel (BMET)  4. 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  It is not severe but it is bad enough to where she benefits from having assisted living we will monitor this if it does show signs of progression I will recommend medication follow-up again in 3 months time sooner if any problems  As for her intestinal symptoms I believe that this is IBS issues we will try Bentyl to see if this helps she was encouraged to follow-up with [place] if any bloody stools or severe constipation  Anxiety I believe this is related into being in an unfamiliar environment hopefully will get better with time  25 minutes was spent with the patient.  This statement verifies that 25 minutes was indeed spent with the patient.  More than 50% of this visit-total duration of the visit-was spent in counseling and coordination of care. The issues that the patient came in for today as reflected in the diagnosis (s) please refer to documentation for further details.

## 2019-03-23 NOTE — Telephone Encounter (Signed)
Nurses please do a written prescription for generic Preparation H cream apply to rectum 3 times daily as needed irritation I will sign it Then send that to [facility] Patient stays at [facility]

## 2019-03-24 NOTE — Telephone Encounter (Signed)
Script signed and faxed to [facility]

## 2019-03-30 DIAGNOSIS — M79675 Pain in left toe(s): Secondary | ICD-10-CM | POA: Diagnosis not present

## 2019-03-30 DIAGNOSIS — M79674 Pain in right toe(s): Secondary | ICD-10-CM | POA: Diagnosis not present

## 2019-03-30 DIAGNOSIS — B351 Tinea unguium: Secondary | ICD-10-CM | POA: Diagnosis not present

## 2019-03-31 ENCOUNTER — Other Ambulatory Visit: Payer: Self-pay | Admitting: Family Medicine

## 2019-04-07 DIAGNOSIS — I10 Essential (primary) hypertension: Secondary | ICD-10-CM | POA: Diagnosis not present

## 2019-04-07 DIAGNOSIS — E119 Type 2 diabetes mellitus without complications: Secondary | ICD-10-CM | POA: Diagnosis not present

## 2019-04-08 ENCOUNTER — Encounter: Payer: Self-pay | Admitting: Family Medicine

## 2019-04-08 LAB — BASIC METABOLIC PANEL
BUN/Creatinine Ratio: 19 (ref 12–28)
BUN: 16 mg/dL (ref 8–27)
CO2: 25 mmol/L (ref 20–29)
Calcium: 9.2 mg/dL (ref 8.7–10.3)
Chloride: 99 mmol/L (ref 96–106)
Creatinine, Ser: 0.85 mg/dL (ref 0.57–1.00)
GFR calc Af Amer: 72 mL/min/{1.73_m2} (ref >59)
GFR calc non Af Amer: 63 mL/min/{1.73_m2} (ref >59)
Glucose: 140 mg/dL — ABNORMAL HIGH (ref 65–99)
Potassium: 4.6 mmol/L (ref 3.5–5.2)
Sodium: 139 mmol/L (ref 134–144)

## 2019-04-08 LAB — HEMOGLOBIN A1C
Est. average glucose Bld gHb Est-mCnc: 140 mg/dL
Hgb A1c MFr Bld: 6.5 % — ABNORMAL HIGH (ref 4.8–5.6)

## 2019-04-10 ENCOUNTER — Other Ambulatory Visit: Payer: Self-pay | Admitting: Family Medicine

## 2019-04-12 ENCOUNTER — Other Ambulatory Visit: Payer: Self-pay | Admitting: Family Medicine

## 2019-04-12 NOTE — Telephone Encounter (Signed)
May have 30-day with 5 refills each

## 2019-04-15 ENCOUNTER — Encounter: Payer: Self-pay | Admitting: *Deleted

## 2019-04-15 ENCOUNTER — Telehealth: Payer: Self-pay | Admitting: Family Medicine

## 2019-04-15 ENCOUNTER — Other Ambulatory Visit: Payer: Self-pay | Admitting: *Deleted

## 2019-04-15 NOTE — Telephone Encounter (Signed)
Last med check 12/21/18

## 2019-04-15 NOTE — Telephone Encounter (Signed)
May have this prescription of 30 with 12 refills

## 2019-04-15 NOTE — Telephone Encounter (Signed)
Script faxed to [facility]

## 2019-04-15 NOTE — Telephone Encounter (Signed)
[facility] in [place] calling to say they got all prescriptions except for her Vitamin B12 1250 mcg, take one daily at bedtime.

## 2019-04-21 ENCOUNTER — Telehealth: Payer: Self-pay | Admitting: Family Medicine

## 2019-04-21 NOTE — Telephone Encounter (Signed)
Please fill out form in your box from [facility] has been sent over twice.

## 2019-04-22 NOTE — Telephone Encounter (Signed)
And I have filled it out every single time thank you

## 2019-04-26 NOTE — Progress Notes (Signed)
Cardiology Office Note    Date:  04/26/2019   ID:  Natessa, Exum [birth year], MRN [id number]  PCP:  Kathyrn Drown, MD  Cardiologist: Jenkins Rouge, MD    No chief complaint on file.   History of Present Illness:    85 y.o. history of HTN, HLD, DM-2. Atypical chest pain in ER 01/27/18 and r/o for MI had normal echo in August 2017 Now in assisted living with more memory issues Using xanax 3 times/ day Some diarrhea and IBS Rx with Bentyl by Dr Wolfgang Phoenix      Past Medical History:  Diagnosis Date  . Cognitive dysfunction 03/04/2019   Patient scores 21 out of 30 on [place] cognitive assessment September 2020  . Diabetes mellitus without complication ([place])   . Diverticulitis   . Frailty 03/04/2019  . H/O bilateral breast reduction surgery   . Hyperlipidemia    a. intolerant to statins.   . Hypertension     Past Surgical History:  Procedure Laterality Date  . ABDOMINAL HYSTERECTOMY    . APPENDECTOMY    . BREAST REDUCTION SURGERY    . COLON SURGERY Left    Hemicolectomy due to diverticulitis  . EYE SURGERY  03/30/2009   cataract  . KNEE SURGERY    . LAPAROSCOPIC INCISIONAL / UMBILICAL / VENTRAL HERNIA REPAIR  02/23/2007   . REDUCTION MAMMAPLASTY Bilateral 2001  . REFRACTIVE SURGERY      Current Medications: Outpatient Medications Prior to Visit  Medication Sig Dispense Refill  . ALPRAZolam (XANAX) 0.25 MG tablet Take 1 tablet (0.25 mg total) by mouth 3 (three) times daily as needed for anxiety. 90 tablet 5  . amLODipine (NORVASC) 5 MG tablet TAKE 1 TABLET BY MOUTH ONCE DAILY. 30 tablet 5  . beta carotene w/minerals (OCUVITE) tablet TAKE 1 TABLET BY MOUTH AT BEDTIME. 30 tablet 5  . Cyanocobalamin 2500 MCG TABS Take 2,500 mcg by mouth daily.     Marland Kitchen dicyclomine (BENTYL) 10 MG capsule 1 bid prn abd cramps and loose stool 60 capsule 5  . famotidine (PEPCID) 20 MG tablet TAKE 1 TABLET BY MOUTH TWICE DAILY. 60 tablet 5  . hydrochlorothiazide (HYDRODIURIL) 25 MG tablet Take  1 tablet (25 mg total) by mouth daily. 30 tablet 5  . lactobacillus acidophilus (BACID) TABS tablet Take 2 tablets by mouth 3 (three) times daily.    Marland Kitchen losartan (COZAAR) 100 MG tablet Take 1 tablet by mouth once daily 90 tablet 0  . metFORMIN (GLUCOPHAGE) 1000 MG tablet TAKE 1/2 TABLET BY MOUTH TWICE DAILY. 30 tablet 5  . OVER THE COUNTER MEDICATION Vitamin b -12 2500 mcg sublingual tablet #15 take one half tablet by mouth at bedtime. ( dr scott hand wrote rx on 04/15/19 and gave one year of refills since not able to find in epic)    . PAIN RELIEF EXTRA STRENGTH 500 MG tablet TAKE (1) TABLET BY MOUTH EVERY (6) HOURS AS NEEDED FOR PAIN. 30 tablet 5  . saccharomyces boulardii (FLORASTOR) 250 MG capsule Take 1 capsule (250 mg total) by mouth 2 (two) times daily. 60 capsule 0  . Vitamin D, Cholecalciferol, 10 MCG (400 UNIT) TABS TAKE 1 TABLET BY MOUTH ONCE DAILY. 30 tablet 5   No facility-administered medications prior to visit.      Allergies:   Lisinopril, Phenergan [promethazine hcl], Statins, Trazodone and nefazodone, and Prednisone   Social History   Socioeconomic History  . Marital status: Widowed    Spouse name: Not on  file  . Number of children: Not on file  . Years of education: Not on file  . Highest education level: Not on file  Occupational History  . Not on file  Social Needs  . Financial resource strain: Not on file  . Food insecurity    Worry: Not on file    Inability: Not on file  . Transportation needs    Medical: Not on file    Non-medical: Not on file  Tobacco Use  . Smoking status: Never Smoker  . Smokeless tobacco: Never Used  Substance and Sexual Activity  . Alcohol use: No  . Drug use: No  . Sexual activity: Never    Birth control/protection: Abstinence  Lifestyle  . Physical activity    Days per week: Not on file    Minutes per session: Not on file  . Stress: Not on file  Relationships  . Social Herbalist on phone: Not on file    Gets  together: Not on file    Attends religious service: Not on file    Active member of club or organization: Not on file    Attends meetings of clubs or organizations: Not on file    Relationship status: Not on file  Other Topics Concern  . Not on file  Social History Narrative  . Not on file     Family History:  The patient's family history includes Cancer in her father and mother; Diabetes in her brother; Hypertension in her father and sister; Stroke in her father.   Review of Systems:   Please see the history of present illness.     General:  No chills, fever, night sweats or weight changes. Positive for eye pain (improving).  Cardiovascular:  No chest pain, dyspnea on exertion, edema, orthopnea, palpitations, paroxysmal nocturnal dyspnea. Dermatological: No rash, lesions/masses Respiratory: No cough, dyspnea Urologic: No hematuria, dysuria Abdominal:   No nausea, vomiting, diarrhea, bright red blood per rectum, melena, or hematemesis Neurologic:  No visual changes, wkns, changes in mental status.  All other systems reviewed and are otherwise negative except as noted above.   Physical Exam:    VS:  There were no vitals taken for this visit.    Affect appropriate Healthy:  appears stated age 72: normal Neck supple with no adenopathy JVP normal no bruits no thyromegaly Lungs clear with no wheezing and good diaphragmatic motion Heart:  S1/S2 no murmur, no rub, gallop or click PMI normal Abdomen: benighn, BS positve, no tenderness, no AAA no bruit.  No HSM or HJR Distal pulses intact with no bruits No edema Neuro non-focal Skin warm and dry No muscular weakness   Wt Readings from Last 3 Encounters:  03/23/19 164 lb 3.2 oz (74.5 kg)  01/18/19 157 lb (71.2 kg)  01/06/19 157 lb 12.8 oz (71.6 kg)     Studies/Labs Reviewed:   EKG:  EKG is not ordered today.  Recent Labs: 11/26/2018: ALT 19 12/31/2018: TSH 1.950 01/12/2019: Hemoglobin 14.3; Platelets 351 04/07/2019:  BUN 16; Creatinine, Ser 0.85; Potassium 4.6; Sodium 139   Lipid Panel    Component Value Date/Time   CHOL 156 12/27/2017 0803   TRIG 188 (H) 12/27/2017 0803   HDL 36 (L) 12/27/2017 0803   CHOLHDL 4.3 12/27/2017 0803   CHOLHDL 3.3 07/05/2014 0907   VLDL 22 07/05/2014 0907   LDLCALC 82 12/27/2017 0803    Additional studies/ records that were reviewed today include:   Echocardiogram: 01/2016 Study Conclusions  -  Left ventricle: The cavity size was normal. Systolic function was   normal. The estimated ejection fraction was in the range of 55%   to 60%. Wall motion was normal; there were no regional wall   motion abnormalities. Doppler parameters are consistent with   abnormal left ventricular relaxation (grade 1 diastolic   dysfunction). - Pericardium, extracardiac: A small pericardial effusion was   identified.  Assessment:    No diagnosis found.   Plan:   In order of problems listed above:  1. HTN improved with addition of hydralazine observe   2. HLD: intolerant to statins LDL in 80 range with diet Rx alone observe   3. Palpitations: resolved observe   4. Type 2 DM : Discussed low carb diet.  Target hemoglobin A1c is 6.5 or less.  Continue current medications.      Medication Adjustments/Labs and Tests Ordered: Current medicines are reviewed at length with the patient today.  Concerns regarding medicines are outlined above.  Medication changes, Labs and Tests ordered today are listed in the Patient Instructions below. Patient Instructions  Your physician wants you to follow-up in: 1 year with me  You will receive a reminder letter in the mail two months in advance. If you don't receive a letter, please call our office to schedule the follow-up appointment.  Your physician recommends that you continue on your current medications as directed. Please refer to the Current Medication list given to you today.  If you need a refill on your cardiac medications before  your next appointment, please call your pharmacy.  No labs or tests today  Thank you for choosing [facility] !   Jenkins Rouge

## 2019-05-03 ENCOUNTER — Encounter: Payer: Self-pay | Admitting: Cardiovascular Disease

## 2019-05-03 ENCOUNTER — Telehealth (INDEPENDENT_AMBULATORY_CARE_PROVIDER_SITE_OTHER): Payer: Medicare Other | Admitting: Cardiovascular Disease

## 2019-05-03 VITALS — Ht 68.0 in | Wt 164.0 lb

## 2019-05-03 DIAGNOSIS — I10 Essential (primary) hypertension: Secondary | ICD-10-CM

## 2019-05-03 NOTE — Patient Instructions (Signed)
Medication Instructions:  Your physician recommends that you continue on your current medications as directed. Please refer to the Current Medication list given to you today.   Labwork: none  Testing/Procedures: none  Follow-Up: Your physician recommends that you schedule a follow-up appointment in: as needed    Any Other Special Instructions Will Be Listed Below (If Applicable).     If you need a refill on your cardiac medications before your next appointment, please call your pharmacy.  av

## 2019-05-03 NOTE — Progress Notes (Signed)
Cardiology Office Note    Date:  05/03/2019   ID:  Linda, [name] September 27, 1931, MRN [id number]  PCP:  Kathyrn Drown, MD  Cardiologist: Jenkins Rouge, MD  Location: [facility] Office  Patient location:  Assisted living TeleVisit:  No chief complaint on file.   History of Present Illness:    85 y.o. history of HTN, HLD, DM-2. Atypical chest pain in ER 01/27/18 and r/o for MI had normal echo in August 2017 Now in assisted living with more memory issues Using xanax 3 times/ day Some diarrhea and IBS Rx with Bentyl by Dr Angelique Blonder to have gone down hill since having C-diff last August. Went to [facility] in [place] first now in [facility] in [place] No active cardiac issues Labs being followed by Dr Wolfgang Phoenix    Past Medical History:  Diagnosis Date  . Cognitive dysfunction 03/04/2019   Patient scores 21 out of 30 on [place] cognitive assessment September 2020  . Diabetes mellitus without complication ([place])   . Diverticulitis   . Frailty 03/04/2019  . H/O bilateral breast reduction surgery   . Hyperlipidemia    a. intolerant to statins.   . Hypertension     Past Surgical History:  Procedure Laterality Date  . ABDOMINAL HYSTERECTOMY    . APPENDECTOMY    . BREAST REDUCTION SURGERY    . COLON SURGERY Left    Hemicolectomy due to diverticulitis  . EYE SURGERY  03/30/2009   cataract  . KNEE SURGERY    . LAPAROSCOPIC INCISIONAL / UMBILICAL / VENTRAL HERNIA REPAIR  02/23/2007   . REDUCTION MAMMAPLASTY Bilateral 2001  . REFRACTIVE SURGERY      Current Medications: Outpatient Medications Prior to Visit  Medication Sig Dispense Refill  . ALPRAZolam (XANAX) 0.25 MG tablet Take 1 tablet (0.25 mg total) by mouth 3 (three) times daily as needed for anxiety. 90 tablet 5  . amLODipine (NORVASC) 5 MG tablet TAKE 1 TABLET BY MOUTH ONCE DAILY. 30 tablet 5  . beta carotene w/minerals (OCUVITE) tablet TAKE 1 TABLET BY MOUTH AT BEDTIME. 30 tablet 5  . Cyanocobalamin 2500 MCG TABS  Take 2,500 mcg by mouth daily.     Marland Kitchen dicyclomine (BENTYL) 10 MG capsule 1 bid prn abd cramps and loose stool 60 capsule 5  . famotidine (PEPCID) 20 MG tablet TAKE 1 TABLET BY MOUTH TWICE DAILY. 60 tablet 5  . hydrochlorothiazide (HYDRODIURIL) 25 MG tablet Take 1 tablet (25 mg total) by mouth daily. 30 tablet 5  . lactobacillus acidophilus (BACID) TABS tablet Take 2 tablets by mouth 3 (three) times daily.    Marland Kitchen losartan (COZAAR) 100 MG tablet Take 1 tablet by mouth once daily 90 tablet 0  . metFORMIN (GLUCOPHAGE) 1000 MG tablet TAKE 1/2 TABLET BY MOUTH TWICE DAILY. 30 tablet 5  . OVER THE COUNTER MEDICATION Vitamin b -12 2500 mcg sublingual tablet #15 take one half tablet by mouth at bedtime. ( dr scott hand wrote rx on 04/15/19 and gave one year of refills since not able to find in epic)    . PAIN RELIEF EXTRA STRENGTH 500 MG tablet TAKE (1) TABLET BY MOUTH EVERY (6) HOURS AS NEEDED FOR PAIN. 30 tablet 5  . saccharomyces boulardii (FLORASTOR) 250 MG capsule Take 1 capsule (250 mg total) by mouth 2 (two) times daily. 60 capsule 0  . Vitamin D, Cholecalciferol, 10 MCG (400 UNIT) TABS TAKE 1 TABLET BY MOUTH ONCE DAILY. 30 tablet 5   No facility-administered medications  prior to visit.      Allergies:   Lisinopril, Phenergan [promethazine hcl], Statins, Trazodone and nefazodone, and Prednisone   Social History   Socioeconomic History  . Marital status: Widowed    Spouse name: Not on file  . Number of children: Not on file  . Years of education: Not on file  . Highest education level: Not on file  Occupational History  . Not on file  Social Needs  . Financial resource strain: Not on file  . Food insecurity    Worry: Not on file    Inability: Not on file  . Transportation needs    Medical: Not on file    Non-medical: Not on file  Tobacco Use  . Smoking status: Never Smoker  . Smokeless tobacco: Never Used  Substance and Sexual Activity  . Alcohol use: No  . Drug use: No  . Sexual  activity: Never    Birth control/protection: Abstinence  Lifestyle  . Physical activity    Days per week: Not on file    Minutes per session: Not on file  . Stress: Not on file  Relationships  . Social Herbalist on phone: Not on file    Gets together: Not on file    Attends religious service: Not on file    Active member of club or organization: Not on file    Attends meetings of clubs or organizations: Not on file    Relationship status: Not on file  Other Topics Concern  . Not on file  Social History Narrative  . Not on file     Family History:  The patient's family history includes Cancer in her father and mother; Diabetes in her brother; Hypertension in her father and sister; Stroke in her father.   Review of Systems:   Please see the history of present illness.     General:  No chills, fever, night sweats or weight changes. Positive for eye pain (improving).  Cardiovascular:  No chest pain, dyspnea on exertion, edema, orthopnea, palpitations, paroxysmal nocturnal dyspnea. Dermatological: No rash, lesions/masses Respiratory: No cough, dyspnea Urologic: No hematuria, dysuria Abdominal:   No nausea, vomiting, diarrhea, bright red blood per rectum, melena, or hematemesis Neurologic:  No visual changes, wkns, changes in mental status.  All other systems reviewed and are otherwise negative except as noted above.   Physical Exam:    VS:  Ht 5\' 8"  (1.727 m)   Wt 164 lb (74.4 kg)   BMI 24.94 kg/m    BP:  147/78 mmHg Pulse 86 bpm Weight 164 lbs RR 12 Temp 97.7    Telephone no exam   Wt Readings from Last 3 Encounters:  05/03/19 164 lb (74.4 kg)  03/23/19 164 lb 3.2 oz (74.5 kg)  01/18/19 157 lb (71.2 kg)     Studies/Labs Reviewed:   EKG:  EKG is not ordered today.  Recent Labs: 11/26/2018: ALT 19 12/31/2018: TSH 1.950 01/12/2019: Hemoglobin 14.3; Platelets 351 04/07/2019: BUN 16; Creatinine, Ser 0.85; Potassium 4.6; Sodium 139   Lipid Panel     Component Value Date/Time   CHOL 156 12/27/2017 0803   TRIG 188 (H) 12/27/2017 0803   HDL 36 (L) 12/27/2017 0803   CHOLHDL 4.3 12/27/2017 0803   CHOLHDL 3.3 07/05/2014 0907   VLDL 22 07/05/2014 0907   LDLCALC 82 12/27/2017 0803    Additional studies/ records that were reviewed today include:   Echocardiogram: 01/2016 Study Conclusions  - Left ventricle: The cavity size  was normal. Systolic function was   normal. The estimated ejection fraction was in the range of 55%   to 60%. Wall motion was normal; there were no regional wall   motion abnormalities. Doppler parameters are consistent with   abnormal left ventricular relaxation (grade 1 diastolic   dysfunction). - Pericardium, extracardiac: A small pericardial effusion was   identified.  Assessment:    HTN  Plan:   In order of problems listed above:  1. HTN improved with addition of hydralazine observe   2. HLD: intolerant to statins LDL in 80 range with diet Rx alone observe   3. Palpitations: resolved observe   4. Type 2 DM : Discussed low carb diet.  Target hemoglobin A1c is 6.5 or less.  Continue current medications. A1c is 6.5       Medication Adjustments/Labs and Tests Ordered: Current medicines are reviewed at length with the patient today.  Concerns regarding medicines are outlined above.  Medication changes, Labs and Tests ordered today are listed in the Patient Instructions below. Patient Instructions  Your physician wants you to follow-up in: 1 year with me  You will receive a reminder letter in the mail two months in advance. If you don't receive a letter, please call our office to schedule the follow-up appointment.  Your physician recommends that you continue on your current medications as directed. Please refer to the Current Medication list given to you today.  If you need a refill on your cardiac medications before your next appointment, please call your pharmacy.  No labs or tests today  F/U  PRN with cardiology    Jenkins Rouge

## 2019-05-05 ENCOUNTER — Other Ambulatory Visit: Payer: Self-pay | Admitting: Family Medicine

## 2019-05-27 ENCOUNTER — Emergency Department (HOSPITAL_COMMUNITY): Payer: Medicare Other

## 2019-05-27 ENCOUNTER — Emergency Department (HOSPITAL_COMMUNITY)
Admission: EM | Admit: 2019-05-27 | Discharge: 2019-05-27 | Disposition: A | Discharge status: Skilled Nursing Facility | Payer: Medicare Other | Attending: Emergency Medicine | Admitting: Emergency Medicine

## 2019-05-27 ENCOUNTER — Other Ambulatory Visit: Payer: Self-pay

## 2019-05-27 ENCOUNTER — Encounter (HOSPITAL_COMMUNITY): Payer: Self-pay | Admitting: Emergency Medicine

## 2019-05-27 DIAGNOSIS — R079 Chest pain, unspecified: Secondary | ICD-10-CM

## 2019-05-27 DIAGNOSIS — R0789 Other chest pain: Secondary | ICD-10-CM | POA: Insufficient documentation

## 2019-05-27 DIAGNOSIS — G4489 Other headache syndrome: Secondary | ICD-10-CM | POA: Diagnosis not present

## 2019-05-27 DIAGNOSIS — I10 Essential (primary) hypertension: Secondary | ICD-10-CM | POA: Insufficient documentation

## 2019-05-27 DIAGNOSIS — Z7984 Long term (current) use of oral hypoglycemic drugs: Secondary | ICD-10-CM | POA: Diagnosis not present

## 2019-05-27 DIAGNOSIS — E119 Type 2 diabetes mellitus without complications: Secondary | ICD-10-CM | POA: Insufficient documentation

## 2019-05-27 DIAGNOSIS — Z79899 Other long term (current) drug therapy: Secondary | ICD-10-CM | POA: Insufficient documentation

## 2019-05-27 LAB — CBC WITH DIFFERENTIAL/PLATELET
Abs Immature Granulocytes: 0.03 10*3/uL (ref 0.00–0.07)
Basophils Absolute: 0 10*3/uL (ref 0.0–0.1)
Basophils Relative: 0 %
Eosinophils Absolute: 0.1 10*3/uL (ref 0.0–0.5)
Eosinophils Relative: 2 %
HCT: 37.5 % (ref 36.0–46.0)
Hemoglobin: 11.9 g/dL — ABNORMAL LOW (ref 12.0–15.0)
Immature Granulocytes: 0 %
Lymphocytes Relative: 17 %
Lymphs Abs: 1.4 10*3/uL (ref 0.7–4.0)
MCH: 28.5 pg (ref 26.0–34.0)
MCHC: 31.7 g/dL (ref 30.0–36.0)
MCV: 89.9 fL (ref 80.0–100.0)
Monocytes Absolute: 0.6 10*3/uL (ref 0.1–1.0)
Monocytes Relative: 7 %
Neutro Abs: 6.4 10*3/uL (ref 1.7–7.7)
Neutrophils Relative %: 74 %
Platelets: 253 10*3/uL (ref 150–400)
RBC: 4.17 MIL/uL (ref 3.87–5.11)
RDW: 13.1 % (ref 11.5–15.5)
WBC: 8.6 10*3/uL (ref 4.0–10.5)
nRBC: 0 % (ref 0.0–0.2)

## 2019-05-27 LAB — BASIC METABOLIC PANEL
Anion gap: 10 (ref 5–15)
BUN: 19 mg/dL (ref 8–23)
CO2: 26 mmol/L (ref 22–32)
Calcium: 9.5 mg/dL (ref 8.9–10.3)
Chloride: 102 mmol/L (ref 98–111)
Creatinine, Ser: 0.8 mg/dL (ref 0.44–1.00)
GFR calc Af Amer: >60 mL/min (ref >60)
GFR calc non Af Amer: >60 mL/min (ref >60)
Glucose, Bld: 118 mg/dL — ABNORMAL HIGH (ref 70–99)
Potassium: 4.2 mmol/L (ref 3.5–5.1)
Sodium: 138 mmol/L (ref 135–145)

## 2019-05-27 LAB — TROPONIN I (HIGH SENSITIVITY): Troponin I (High Sensitivity): 5 ng/L (ref <18)

## 2019-05-27 MED ORDER — PANTOPRAZOLE SODIUM 40 MG IV SOLR
40.0000 mg | Freq: Once | INTRAVENOUS | Status: AC
  Administered 2019-05-27: 22:00:00 40 mg via INTRAVENOUS
  Filled 2019-05-27: qty 40

## 2019-05-27 MED ORDER — PANTOPRAZOLE SODIUM 20 MG PO TBEC: 20.0000 mg | DELAYED_RELEASE_TABLET | Freq: Every day | ORAL | 0 refills | Status: DC

## 2019-05-27 NOTE — ED Triage Notes (Signed)
Pt arrives via EMS from [facility] c/o burning in her chest x2 days. Pt given 324 ASA at the facility.

## 2019-05-28 ENCOUNTER — Telehealth: Payer: Self-pay | Admitting: Family Medicine

## 2019-05-28 ENCOUNTER — Other Ambulatory Visit: Payer: Self-pay

## 2019-05-28 MED ORDER — NITROFURANTOIN MONOHYD MACRO 100 MG PO CAPS: ORAL_CAPSULE | ORAL | 0 refills | Status: DC

## 2019-05-28 MED ORDER — AMLODIPINE BESYLATE 5 MG PO TABS: 5.0000 mg | ORAL_TABLET | Freq: Every day | ORAL | 1 refills | Status: DC

## 2019-05-28 NOTE — Telephone Encounter (Signed)
Medication sent in and pt is aware  

## 2019-05-28 NOTE — Telephone Encounter (Signed)
Please advise. Thank you

## 2019-05-28 NOTE — Telephone Encounter (Signed)
macrobid 100 bid 7 d 

## 2019-05-28 NOTE — Telephone Encounter (Signed)
Pt requesting an antibiotic, states her back is hurting near her kidneys.  States pain for 2 days, urine is light yellow  NO - fever, nausea, vomiting, frequency, burning, chills - tried no treatment  Does not want Cipro due to it "caused Cdiff" once before     Please advise & call pt      Rx [facility]

## 2019-05-30 NOTE — ED Provider Notes (Signed)
[facility] EMERGENCY DEPARTMENT Provider Note   CSN: [id number] Arrival date & time: 05/27/19  1943     History   Chief Complaint Chief Complaint  Patient presents with  . Chest Pain    HPI [name] is a 85 y.o. female.     HPI   Very pleasant 85 year old female presenting with chest discomfort.  Describes burning sensation in the center of her chest.  Is waxed and waned over the past 2 to 3 days.  No appreciable exacerbating factors.  Given aspirin earlier today at her facility without improvement.  Denies any acute change otherwise such as dyspnea, nausea or diaphoresis.  Denies any past history of reflux.  No fevers or chills.  No cough.  Past Medical History:  Diagnosis Date  . Cognitive dysfunction 03/04/2019   Patient scores 21 out of 30 on [place] cognitive assessment September 2020  . Diabetes mellitus without complication ([place])   . Diverticulitis   . Frailty 03/04/2019  . H/O bilateral breast reduction surgery   . Hyperlipidemia    a. intolerant to statins.   . Hypertension     Patient Active Problem List   Diagnosis Date Noted  . Cognitive dysfunction 03/04/2019  . Frailty 03/04/2019  . Generalized anxiety disorder 06/05/2018  . Vomiting and diarrhea 03/08/2018  . Dehydration 03/08/2018  . Hyponatremia 03/08/2018  . C. difficile diarrhea 03/03/2018  . Acute lower UTI 03/03/2018  . Hyperlipidemia associated with type 2 diabetes mellitus ([place]) 10/02/2016  . Pedal edema 10/02/2016  . Insomnia 10/02/2016  . SBO (small bowel obstruction) ([place]) 07/02/2016  . Dyspnea on exertion 02/24/2016  . Other fatigue   . E-coli UTI 06/18/2015  . Partial small bowel obstruction ([place]) 06/15/2015  . Ileitis, regional, with intestinal obstruction 06/15/2015  . Leukocytosis 12/16/2014  . Nausea and vomiting 12/15/2014  . HLD (hyperlipidemia) 12/15/2014  . Essential hypertension 12/15/2014  . Controlled type 2 diabetes mellitus without complication, without  long-term current use of insulin ([place]) 02/15/2011    Past Surgical History:  Procedure Laterality Date  . ABDOMINAL HYSTERECTOMY    . APPENDECTOMY    . BREAST REDUCTION SURGERY    . COLON SURGERY Left    Hemicolectomy due to diverticulitis  . EYE SURGERY  03/30/2009   cataract  . KNEE SURGERY    . LAPAROSCOPIC INCISIONAL / UMBILICAL / VENTRAL HERNIA REPAIR  02/23/2007   . REDUCTION MAMMAPLASTY Bilateral 2001  . REFRACTIVE SURGERY       OB History   No obstetric history on file.      Home Medications    Prior to Admission medications   Medication Sig Start Date End Date Taking? Authorizing Provider  ALPRAZolam (XANAX) 0.25 MG tablet Take 1 tablet (0.25 mg total) by mouth 3 (three) times daily as needed for anxiety. 03/23/19   Kathyrn Drown, MD  amLODipine (NORVASC) 5 MG tablet Take 1 tablet (5 mg total) by mouth daily. 05/28/19   Kathyrn Drown, MD  beta carotene w/minerals (OCUVITE) tablet TAKE 1 TABLET BY MOUTH AT BEDTIME. 04/12/19   Luking, Elayne Snare, MD  Cyanocobalamin 2500 MCG TABS Take 2,500 mcg by mouth daily.     [provider]  dicyclomine (BENTYL) 10 MG capsule 1 bid prn abd cramps and loose stool 03/23/19   Kathyrn Drown, MD  famotidine (PEPCID) 20 MG tablet TAKE 1 TABLET BY MOUTH TWICE DAILY. 04/13/19   Kathyrn Drown, MD  hydrochlorothiazide (HYDRODIURIL) 25 MG tablet Take 1 tablet (  25 mg total) by mouth daily. 01/04/19   Kathyrn Drown, MD  lactobacillus acidophilus (BACID) TABS tablet Take 2 tablets by mouth 3 (three) times daily.    [provider]  losartan (COZAAR) 100 MG tablet TAKE 1 TABLET BY MOUTH ONCE DAILY. 05/06/19   Kathyrn Drown, MD  metFORMIN (GLUCOPHAGE) 1000 MG tablet TAKE 1/2 TABLET BY MOUTH TWICE DAILY. 04/13/19   Kathyrn Drown, MD  nitrofurantoin, macrocrystal-monohydrate, (MACROBID) 100 MG capsule Take one capsule po BID for 7 days 05/28/19   Mikey Kirschner, MD  OVER THE COUNTER MEDICATION Vitamin b -12 2500 mcg  sublingual tablet #15 take one half tablet by mouth at bedtime. ( dr scott hand wrote rx on 04/15/19 and gave one year of refills since not able to find in epic)    [provider]  PAIN RELIEF EXTRA STRENGTH 500 MG tablet TAKE (1) TABLET BY MOUTH EVERY (6) HOURS AS NEEDED FOR PAIN. 03/31/19   Kathyrn Drown, MD  pantoprazole (PROTONIX) 20 MG tablet Take 1 tablet (20 mg total) by mouth daily. 05/27/19   Virgel Manifold, MD  saccharomyces boulardii (FLORASTOR) 250 MG capsule Take 1 capsule (250 mg total) by mouth 2 (two) times daily. 03/12/18   Kathie Dike, MD  Vitamin D, Cholecalciferol, 10 MCG (400 UNIT) TABS TAKE 1 TABLET BY MOUTH ONCE DAILY. 04/13/19   Kathyrn Drown, MD    Family History Family History  Problem Relation Age of Onset  . Hypertension Father        kidney  . Stroke Father   . Cancer Father   . Cancer Mother        ovaian  . Diabetes Brother   . Hypertension Sister     Social History Social History   Tobacco Use  . Smoking status: Never Smoker  . Smokeless tobacco: Never Used  Substance Use Topics  . Alcohol use: No  . Drug use: No     Allergies   Lisinopril, Phenergan [promethazine hcl], Statins, Trazodone and nefazodone, and Prednisone   Review of Systems Review of Systems  All systems reviewed and negative, other than as noted in HPI.  Physical Exam Updated Vital Signs BP (!) 158/64 (BP Location: Right Arm)   Pulse 65   Temp 98.3 F (36.8 C) (Oral)   Resp 17   Ht 5\' 8"  (1.727 m)   Wt 74.8 kg   SpO2 99%   BMI 25.09 kg/m   Physical Exam Vitals signs and nursing note reviewed.  Constitutional:      General: She is not in acute distress.    Appearance: She is well-developed.  HENT:     Head: Normocephalic and atraumatic.  Eyes:     General:        Right eye: No discharge.        Left eye: No discharge.     Conjunctiva/sclera: Conjunctivae normal.  Neck:     Musculoskeletal: Neck supple.  Cardiovascular:     Rate and  Rhythm: Normal rate and regular rhythm.     Heart sounds: Normal heart sounds. No murmur. No friction rub. No gallop.   Pulmonary:     Effort: Pulmonary effort is normal. No respiratory distress.     Breath sounds: Normal breath sounds.  Abdominal:     General: There is no distension.     Palpations: Abdomen is soft.     Tenderness: There is no abdominal tenderness.  Musculoskeletal:  General: No tenderness.  Skin:    General: Skin is warm and dry.  Neurological:     Mental Status: She is alert.  Psychiatric:        Behavior: Behavior normal.        Thought Content: Thought content normal.      ED Treatments / Results  Labs (all labs ordered are listed, but only abnormal results are displayed) Labs Reviewed  CBC WITH DIFFERENTIAL/PLATELET - Abnormal; Notable for the following components:      Result Value   Hemoglobin 11.9 (*)    All other components within normal limits  BASIC METABOLIC PANEL - Abnormal; Notable for the following components:   Glucose, Bld 118 (*)    All other components within normal limits  TROPONIN I (HIGH SENSITIVITY)    EKG EKG Interpretation  Date/Time:  Thursday May 27 2019 19:54:48 EST Ventricular Rate:  89 PR Interval:    QRS Duration: 79 QT Interval:  378 QTC Calculation: 460 R Axis:   33 Text Interpretation: Sinus rhythm Atrial premature complex Prolonged PR interval Anterior infarct, old Confirmed by Virgel Manifold [phone]) on 05/27/2019 8:43:24 PM   Radiology No results found.  Procedures Procedures (including critical care time)  Medications Ordered in ED Medications  pantoprazole (PROTONIX) injection 40 mg (40 mg Intravenous Given 05/27/19 2149)     Initial Impression / Assessment and Plan / ED Course  I have reviewed the triage vital signs and the nursing notes.  Pertinent labs & imaging results that were available during my care of the patient were reviewed by me and considered in my medical decision making  (see chart for details).     85 year old female with chest pain/discomfort.  Describes as burning quality.  Reflux?  Seems atypical for ACS.  Doubt PE, dissection of the emergent process.  We will give a trial of PPI.  PCP follow-up.  Return precautions discussed.  Final Clinical Impressions(s) / ED Diagnoses   Final diagnoses:  Nonspecific chest pain    ED Discharge Orders         Ordered    pantoprazole (PROTONIX) 20 MG tablet  Daily     05/27/19 2206           Virgel Manifold, MD 05/30/19 1553

## 2019-06-01 ENCOUNTER — Telehealth: Payer: Self-pay | Admitting: Family Medicine

## 2019-06-01 DIAGNOSIS — M79675 Pain in left toe(s): Secondary | ICD-10-CM | POA: Diagnosis not present

## 2019-06-01 DIAGNOSIS — B351 Tinea unguium: Secondary | ICD-10-CM | POA: Diagnosis not present

## 2019-06-01 DIAGNOSIS — M79674 Pain in right toe(s): Secondary | ICD-10-CM | POA: Diagnosis not present

## 2019-06-01 NOTE — Telephone Encounter (Signed)
Pt was recently in hospital and pharmacy noticed it was on her med list sent over from hospital it has her taking HCTZ 25 mg. Pt has never taken this medication. A script was sent to [facility] in July but never picked up. They want to confirm if pt should be on this medication or not.

## 2019-06-01 NOTE — Telephone Encounter (Signed)
[facility] care notified and they said no need to call pt because she never got the rx.

## 2019-06-01 NOTE — Telephone Encounter (Signed)
Discontinue HCTZ Notify patient Take the medication off her list Notify the pharmacy as well so they take it off their list

## 2019-06-02 ENCOUNTER — Ambulatory Visit (INDEPENDENT_AMBULATORY_CARE_PROVIDER_SITE_OTHER): Payer: Medicare Other | Admitting: Family Medicine

## 2019-06-02 ENCOUNTER — Other Ambulatory Visit: Payer: Self-pay

## 2019-06-02 DIAGNOSIS — E7849 Other hyperlipidemia: Secondary | ICD-10-CM | POA: Diagnosis not present

## 2019-06-02 DIAGNOSIS — I10 Essential (primary) hypertension: Secondary | ICD-10-CM | POA: Diagnosis not present

## 2019-06-02 DIAGNOSIS — J019 Acute sinusitis, unspecified: Secondary | ICD-10-CM | POA: Diagnosis not present

## 2019-06-02 DIAGNOSIS — E119 Type 2 diabetes mellitus without complications: Secondary | ICD-10-CM | POA: Diagnosis not present

## 2019-06-02 MED ORDER — ALPRAZOLAM 0.25 MG PO TABS: ORAL_TABLET | ORAL | 5 refills | Status: DC

## 2019-06-02 MED ORDER — CEFDINIR 300 MG PO CAPS: ORAL_CAPSULE | ORAL | 0 refills | Status: DC

## 2019-06-02 NOTE — Progress Notes (Signed)
   Subjective:    Patient ID: [name], female    DOB: 1931-12-31, 85 y.o.   MRN: [id number]  HPI Pt went to ER on 05/27/2019 for chest pain. Pt states it felt like her chest was "on fire". Pt states the ER dr diagnosed her with having GERD. Was given Protonix, pt has taken a couple doses.  Patient states she has been taking Protonix and seems to be helping but she does not want to stay on it long-term  Patient states she is stressed out about what is going on she is worried about Covid.  She states her resident did test positive  Pt also having cough and congestion. A resident at [facility] has tested positive for COVID. Health Department is coming to [facility] tomorrow to test all residents.  Patient denies high fever chills sweats wheezing difficulty breathing Virtual Visit via Telephone Note  I connected with [name] on 06/02/19 at  1:10 PM EST by telephone and verified that I am speaking with the correct person using two identifiers.  Location: Patient: home Provider: office   I discussed the limitations, risks, security and privacy concerns of performing an evaluation and management service by telephone and the availability of in person appointments. I also discussed with the patient that there may be a patient responsible charge related to this service. The patient expressed understanding and agreed to proceed.   History of Present Illness:    Observations/Objective:   Assessment and Plan:   Follow Up Instructions:    I discussed the assessment and treatment plan with the patient. The patient was provided an opportunity to ask questions and all were answered. The patient agreed with the plan and demonstrated an understanding of the instructions.   The patient was advised to call back or seek an in-person evaluation if the symptoms worsen or if the condition fails to improve as anticipated.  I provided 17 minutes of non-face-to-face time during this  encounter.   Vicente Males, LPN  Fall Risk  [id number] 05/07/2018 09/16/2017 10/02/2016 01/15/2016  Falls in the past year? 0 0 No No No  Number falls in past yr: 0 - - - -  Injury with Fall? 0 - - - -  Follow up Falls evaluation completed - - - -     Review of Systems  Constitutional: Negative for activity change, fatigue and fever.  HENT: Negative for congestion and rhinorrhea.   Respiratory: Negative for cough, chest tightness and shortness of breath.   Cardiovascular: Negative for chest pain and leg swelling.  Gastrointestinal: Negative for abdominal pain and nausea.  Skin: Negative for color change.  Neurological: Negative for dizziness and headaches.  Psychiatric/Behavioral: Negative for agitation and behavioral problems.       Objective:   Physical Exam  Today's visit was via telephone Physical exam was not possible for this visit       Assessment & Plan:  Acute rhinosinusitis very important for the patient to go ahead with antibiotics twice daily  Covid exposure at nursing home patient can self quarantine for 14 days  Diabetes under good control recent A1c looked good  Blood pressure elevated at times but recent normal with [place] with good  Anxiety related condition may use Xanax up to 3 times daily  Follow-up with [place] in 3 months

## 2019-06-03 DIAGNOSIS — Z20828 Contact with and (suspected) exposure to other viral communicable diseases: Secondary | ICD-10-CM | POA: Diagnosis not present

## 2019-06-03 DIAGNOSIS — U071 COVID-19: Secondary | ICD-10-CM | POA: Diagnosis not present

## 2019-06-04 ENCOUNTER — Telehealth: Payer: Self-pay | Admitting: Family Medicine

## 2019-06-04 NOTE — Telephone Encounter (Signed)
Faxed orders to [facility] and Rx care. Attempted to call [facility] 3 times, but each time was busy. Tried to call pt but no answer.

## 2019-06-04 NOTE — Telephone Encounter (Signed)
I would recommend stopping the antibiotic Also recommend may use Imodium, 2 mg, may take 1 3 times daily as needed for the diarrhea Nurses you will need to write both of these out as orders for me to sign and then fax them to the [facility] as well as [facility] Then they should give [place] an update next week how she is doing on Monday If diarrhea not doing better by Monday we will need to do stool test for C. difficile

## 2019-06-04 NOTE — Telephone Encounter (Signed)
Please advise. Thank you

## 2019-06-04 NOTE — Telephone Encounter (Signed)
[name] with [facility] called to state that the pt is complaining of cramping & diarrhea since starting an antibiotic recently  Please advise     [facility]

## 2019-06-04 NOTE — Telephone Encounter (Signed)
Contacted [facility] and spoke with Madaline Savage and advised her to stop antibiotic and pt may have Imodium 2 mg TID prn. Madaline Savage took a note and verbalized understanding.

## 2019-06-07 ENCOUNTER — Telehealth: Payer: Self-pay | Admitting: Family Medicine

## 2019-06-07 DIAGNOSIS — R05 Cough: Secondary | ICD-10-CM | POA: Diagnosis not present

## 2019-06-07 DIAGNOSIS — Z20828 Contact with and (suspected) exposure to other viral communicable diseases: Secondary | ICD-10-CM | POA: Diagnosis not present

## 2019-06-07 DIAGNOSIS — R531 Weakness: Secondary | ICD-10-CM | POA: Diagnosis not present

## 2019-06-07 DIAGNOSIS — R5381 Other malaise: Secondary | ICD-10-CM | POA: Diagnosis not present

## 2019-06-07 DIAGNOSIS — U071 COVID-19: Secondary | ICD-10-CM | POA: Diagnosis not present

## 2019-06-07 NOTE — Telephone Encounter (Signed)
Patient's son [name] called to let you know his mom tested positive for Covid at [facility]. She was tested on 12/3 and results came back on 12/6

## 2019-06-08 NOTE — Telephone Encounter (Signed)
So I did communicate with her son [name] I also communicated with [facility] 2 separate times yesterday checking in on Mrs. Severino  Nurses-please write out order on prescription pad for the following #1-Tylenol 500 mg, 2 p.o. every 4 hours as needed fever no greater than 8 tablets/day #2-nursing staff to check vitals and O2 saturation 3 times daily If O2 saturation drops into the 80s it is recommended to go to the ER  Please also touch base with [facility] to see how she is doing this morning in regards to O2 saturation etc.

## 2019-06-08 NOTE — Telephone Encounter (Signed)
Called over to [facility] and spoke with medtech. She states pt is about the same. No sob, no fever. Feels weak and not wanting to eat much. Orders for tylenol and to check vitals and 02 faxed over to [facility]

## 2019-06-09 DIAGNOSIS — J1289 Other viral pneumonia: Secondary | ICD-10-CM | POA: Diagnosis not present

## 2019-06-09 DIAGNOSIS — Z7984 Long term (current) use of oral hypoglycemic drugs: Secondary | ICD-10-CM | POA: Diagnosis not present

## 2019-06-09 DIAGNOSIS — I10 Essential (primary) hypertension: Secondary | ICD-10-CM | POA: Diagnosis not present

## 2019-06-09 DIAGNOSIS — F411 Generalized anxiety disorder: Secondary | ICD-10-CM | POA: Diagnosis not present

## 2019-06-09 DIAGNOSIS — E119 Type 2 diabetes mellitus without complications: Secondary | ICD-10-CM | POA: Diagnosis not present

## 2019-06-09 DIAGNOSIS — U071 COVID-19: Secondary | ICD-10-CM | POA: Diagnosis not present

## 2019-06-10 ENCOUNTER — Telehealth: Payer: Self-pay | Admitting: Family Medicine

## 2019-06-10 ENCOUNTER — Other Ambulatory Visit: Payer: Self-pay | Admitting: Family Medicine

## 2019-06-10 ENCOUNTER — Ambulatory Visit (INDEPENDENT_AMBULATORY_CARE_PROVIDER_SITE_OTHER): Payer: Medicare Other | Admitting: Family Medicine

## 2019-06-10 DIAGNOSIS — E119 Type 2 diabetes mellitus without complications: Secondary | ICD-10-CM | POA: Diagnosis not present

## 2019-06-10 DIAGNOSIS — J189 Pneumonia, unspecified organism: Secondary | ICD-10-CM | POA: Diagnosis not present

## 2019-06-10 DIAGNOSIS — R0609 Other forms of dyspnea: Secondary | ICD-10-CM

## 2019-06-10 DIAGNOSIS — F411 Generalized anxiety disorder: Secondary | ICD-10-CM | POA: Diagnosis not present

## 2019-06-10 DIAGNOSIS — U071 COVID-19: Secondary | ICD-10-CM | POA: Diagnosis not present

## 2019-06-10 DIAGNOSIS — R54 Age-related physical debility: Secondary | ICD-10-CM

## 2019-06-10 DIAGNOSIS — R531 Weakness: Secondary | ICD-10-CM

## 2019-06-10 DIAGNOSIS — Z7984 Long term (current) use of oral hypoglycemic drugs: Secondary | ICD-10-CM | POA: Diagnosis not present

## 2019-06-10 DIAGNOSIS — J1289 Other viral pneumonia: Secondary | ICD-10-CM | POA: Diagnosis not present

## 2019-06-10 DIAGNOSIS — I10 Essential (primary) hypertension: Secondary | ICD-10-CM | POA: Diagnosis not present

## 2019-06-10 MED ORDER — AZITHROMYCIN 250 MG PO TABS: ORAL_TABLET | ORAL | 0 refills | Status: DC

## 2019-06-10 NOTE — Telephone Encounter (Signed)
[facility] notified and will deliver the medication to [facility] tonight. Prescription order faxed to [facility].

## 2019-06-10 NOTE — Telephone Encounter (Signed)
It would be perfectly fine to go ahead and give verbal orders regarding this  Also if the clinical nurse could give [place] an update on how they feel [name] is doing regarding O2 saturation breathing etc. that would be appreciated

## 2019-06-10 NOTE — Telephone Encounter (Signed)
Home health stated she saw the patient yesterday and her o2s were 91-92% and went up to 96% with deep breathing. Patient stated her right middle and lower lobe sounded like there was some fluid but not overly congested. She stated she will try to get in touch with who saw her today to get update and call us back if any changes in condition.

## 2019-06-10 NOTE — Telephone Encounter (Signed)
Kathlee Nations with [facility] home [facility] calling to see if she can get orders for Physical and Occupational therapy for patient due to being Covid positive.  She thinks this will help since they are not allowed to leave their room and the therapy will help with pnuemonia.  [phone]

## 2019-06-10 NOTE — Telephone Encounter (Signed)
Kim at [facility] [phone]) calling because she got the order for PT and OT for patient who is covid + and said the patient needed a virtual face to face for insurance.  I told her they were the ones that called this morning and requested the therapy and that patient just had a virtual last week for these symptoms.  Kim wanted to know if we could addend the note from 06-02-19 to say that PT and OT therapy requested and fax it to them at  Fax # [phone]. Or we will need to schedule a visit for patient.

## 2019-06-10 NOTE — Telephone Encounter (Signed)
I talked with the nurses They feel that things are going better heard lungs do have some crackles in the right base also to her appetite is fair and she is able to walk around.  No respiratory distress O2 sat 97% We will send in Zithromax for the next 5 days.Rx care I sent the prescription but please call Rx care and ask for them to deliver it to [facility] Also have the front put the patient on my list of patients are marked as arrived and I will document later tonight Finally fax over order to [facility] for them to give the Zithromax

## 2019-06-10 NOTE — Progress Notes (Addendum)
   Subjective:    Patient ID: [name], female    DOB: Jul 05, 1931, 85 y.o.   MRN: [id number] Virtual Visit via Video Note  I connected with [name] on 06/10/19 at  4:10 PM EST by a video enabled telemedicine application and verified that I am speaking with the correct person using two identifiers.  Location: Patient: rest home Provider: Office   I discussed the limitations of evaluation and management by telemedicine and the availability of in person appointments. The patient expressed understanding and agreed to proceed.  History of Present Illness:    Observations/Objective:   Assessment and Plan:   Follow Up Instructions:    I discussed the assessment and treatment plan with the patient. The patient was provided an opportunity to ask questions and all were answered. The patient agreed with the plan and demonstrated an understanding of the instructions.   The patient was advised to call back or seek an in-person evaluation if the symptoms worsen or if the condition fails to improve as anticipated.  I provided 15 minutes of non-face-to-face time during this encounter.   Sallee Lange, MD   HPI Patient with significant cough denies high fever chills sweats denies wheezing or shortness of breath O2 saturation 95% patient does have home health nurse which is coming out to see her.  Nurse states that she has crackles in the Currently no evidence of any right lower base Patient is Covid positive  Review of Systems  Constitutional: Negative for activity change and fever.  HENT: Positive for congestion and rhinorrhea. Negative for ear pain.   Eyes: Negative for discharge.  Respiratory: Positive for cough. Negative for shortness of breath and wheezing.   Cardiovascular: Negative for chest pain.       Objective:   Physical Exam  Today's visit was virtual Physical exam was not possible for this visit  Face-to-face-patient with severe weakness.  She would  benefit from physical therapy and Occupational Therapy to help build her strength up because of Covid       Assessment & Plan:  Early pneumonia Positive Covid Continue home health monitoring Add Z-Pak As long as O2 saturations breathing and oral intake going well does not need to go to the hospital but if this worsens immediately go to the hospital  Physical therapy and Occupational Therapy in order to help build up her strength because of Covid recommended and ordered

## 2019-06-11 ENCOUNTER — Other Ambulatory Visit: Payer: Self-pay | Admitting: Family Medicine

## 2019-06-11 DIAGNOSIS — U071 COVID-19: Secondary | ICD-10-CM | POA: Diagnosis not present

## 2019-06-11 DIAGNOSIS — I10 Essential (primary) hypertension: Secondary | ICD-10-CM | POA: Diagnosis not present

## 2019-06-11 DIAGNOSIS — J1289 Other viral pneumonia: Secondary | ICD-10-CM | POA: Diagnosis not present

## 2019-06-11 DIAGNOSIS — Z7984 Long term (current) use of oral hypoglycemic drugs: Secondary | ICD-10-CM | POA: Diagnosis not present

## 2019-06-11 DIAGNOSIS — E119 Type 2 diabetes mellitus without complications: Secondary | ICD-10-CM | POA: Diagnosis not present

## 2019-06-11 DIAGNOSIS — F411 Generalized anxiety disorder: Secondary | ICD-10-CM | POA: Diagnosis not present

## 2019-06-11 NOTE — Progress Notes (Signed)
PT and OT has been ordered for pt

## 2019-06-11 NOTE — Telephone Encounter (Signed)
Physical therapy occupational therapy ordered within the note from June 10, 2019 please see it Also the note from June 10, 2019 documents the need for physical therapy occupational therapy After placing the order please have the front fax the office note to the agency as requested thank you

## 2019-06-11 NOTE — Telephone Encounter (Signed)
PT and OT orders placed. Asked front staff to fax over Jun 10 2019  office note to [facility].

## 2019-06-12 DIAGNOSIS — J1289 Other viral pneumonia: Secondary | ICD-10-CM | POA: Diagnosis not present

## 2019-06-12 DIAGNOSIS — E119 Type 2 diabetes mellitus without complications: Secondary | ICD-10-CM | POA: Diagnosis not present

## 2019-06-12 DIAGNOSIS — F411 Generalized anxiety disorder: Secondary | ICD-10-CM | POA: Diagnosis not present

## 2019-06-12 DIAGNOSIS — I10 Essential (primary) hypertension: Secondary | ICD-10-CM | POA: Diagnosis not present

## 2019-06-12 DIAGNOSIS — Z7984 Long term (current) use of oral hypoglycemic drugs: Secondary | ICD-10-CM | POA: Diagnosis not present

## 2019-06-12 DIAGNOSIS — U071 COVID-19: Secondary | ICD-10-CM | POA: Diagnosis not present

## 2019-06-12 NOTE — Telephone Encounter (Signed)
6 months on all 

## 2019-06-13 DIAGNOSIS — F411 Generalized anxiety disorder: Secondary | ICD-10-CM | POA: Diagnosis not present

## 2019-06-13 DIAGNOSIS — E119 Type 2 diabetes mellitus without complications: Secondary | ICD-10-CM | POA: Diagnosis not present

## 2019-06-13 DIAGNOSIS — U071 COVID-19: Secondary | ICD-10-CM | POA: Diagnosis not present

## 2019-06-13 DIAGNOSIS — Z7984 Long term (current) use of oral hypoglycemic drugs: Secondary | ICD-10-CM | POA: Diagnosis not present

## 2019-06-13 DIAGNOSIS — J1289 Other viral pneumonia: Secondary | ICD-10-CM | POA: Diagnosis not present

## 2019-06-13 DIAGNOSIS — I10 Essential (primary) hypertension: Secondary | ICD-10-CM | POA: Diagnosis not present

## 2019-06-14 DIAGNOSIS — U071 COVID-19: Secondary | ICD-10-CM | POA: Diagnosis not present

## 2019-06-14 DIAGNOSIS — E119 Type 2 diabetes mellitus without complications: Secondary | ICD-10-CM | POA: Diagnosis not present

## 2019-06-14 DIAGNOSIS — Z7984 Long term (current) use of oral hypoglycemic drugs: Secondary | ICD-10-CM | POA: Diagnosis not present

## 2019-06-14 DIAGNOSIS — J1289 Other viral pneumonia: Secondary | ICD-10-CM | POA: Diagnosis not present

## 2019-06-14 DIAGNOSIS — F411 Generalized anxiety disorder: Secondary | ICD-10-CM | POA: Diagnosis not present

## 2019-06-14 DIAGNOSIS — I10 Essential (primary) hypertension: Secondary | ICD-10-CM | POA: Diagnosis not present

## 2019-06-15 DIAGNOSIS — E119 Type 2 diabetes mellitus without complications: Secondary | ICD-10-CM | POA: Diagnosis not present

## 2019-06-15 DIAGNOSIS — J1289 Other viral pneumonia: Secondary | ICD-10-CM | POA: Diagnosis not present

## 2019-06-15 DIAGNOSIS — F411 Generalized anxiety disorder: Secondary | ICD-10-CM | POA: Diagnosis not present

## 2019-06-15 DIAGNOSIS — I10 Essential (primary) hypertension: Secondary | ICD-10-CM | POA: Diagnosis not present

## 2019-06-15 DIAGNOSIS — Z7984 Long term (current) use of oral hypoglycemic drugs: Secondary | ICD-10-CM | POA: Diagnosis not present

## 2019-06-15 DIAGNOSIS — U071 COVID-19: Secondary | ICD-10-CM | POA: Diagnosis not present

## 2019-06-17 DIAGNOSIS — E119 Type 2 diabetes mellitus without complications: Secondary | ICD-10-CM | POA: Diagnosis not present

## 2019-06-17 DIAGNOSIS — U071 COVID-19: Secondary | ICD-10-CM | POA: Diagnosis not present

## 2019-06-17 DIAGNOSIS — Z7984 Long term (current) use of oral hypoglycemic drugs: Secondary | ICD-10-CM | POA: Diagnosis not present

## 2019-06-17 DIAGNOSIS — J1289 Other viral pneumonia: Secondary | ICD-10-CM | POA: Diagnosis not present

## 2019-06-17 DIAGNOSIS — I10 Essential (primary) hypertension: Secondary | ICD-10-CM | POA: Diagnosis not present

## 2019-06-17 DIAGNOSIS — F411 Generalized anxiety disorder: Secondary | ICD-10-CM | POA: Diagnosis not present

## 2019-06-21 DIAGNOSIS — J1289 Other viral pneumonia: Secondary | ICD-10-CM | POA: Diagnosis not present

## 2019-06-21 DIAGNOSIS — E119 Type 2 diabetes mellitus without complications: Secondary | ICD-10-CM | POA: Diagnosis not present

## 2019-06-21 DIAGNOSIS — I10 Essential (primary) hypertension: Secondary | ICD-10-CM | POA: Diagnosis not present

## 2019-06-21 DIAGNOSIS — U071 COVID-19: Secondary | ICD-10-CM | POA: Diagnosis not present

## 2019-06-21 DIAGNOSIS — Z7984 Long term (current) use of oral hypoglycemic drugs: Secondary | ICD-10-CM | POA: Diagnosis not present

## 2019-06-21 DIAGNOSIS — F411 Generalized anxiety disorder: Secondary | ICD-10-CM | POA: Diagnosis not present

## 2019-06-22 ENCOUNTER — Ambulatory Visit: Payer: Medicare Other | Admitting: Family Medicine

## 2019-06-22 DIAGNOSIS — J1289 Other viral pneumonia: Secondary | ICD-10-CM | POA: Diagnosis not present

## 2019-06-22 DIAGNOSIS — F411 Generalized anxiety disorder: Secondary | ICD-10-CM | POA: Diagnosis not present

## 2019-06-22 DIAGNOSIS — I10 Essential (primary) hypertension: Secondary | ICD-10-CM | POA: Diagnosis not present

## 2019-06-22 DIAGNOSIS — Z7984 Long term (current) use of oral hypoglycemic drugs: Secondary | ICD-10-CM | POA: Diagnosis not present

## 2019-06-22 DIAGNOSIS — E119 Type 2 diabetes mellitus without complications: Secondary | ICD-10-CM | POA: Diagnosis not present

## 2019-06-22 DIAGNOSIS — U071 COVID-19: Secondary | ICD-10-CM | POA: Diagnosis not present

## 2019-06-23 DIAGNOSIS — E119 Type 2 diabetes mellitus without complications: Secondary | ICD-10-CM | POA: Diagnosis not present

## 2019-06-23 DIAGNOSIS — I10 Essential (primary) hypertension: Secondary | ICD-10-CM | POA: Diagnosis not present

## 2019-06-23 DIAGNOSIS — Z7984 Long term (current) use of oral hypoglycemic drugs: Secondary | ICD-10-CM | POA: Diagnosis not present

## 2019-06-23 DIAGNOSIS — J1289 Other viral pneumonia: Secondary | ICD-10-CM | POA: Diagnosis not present

## 2019-06-23 DIAGNOSIS — F411 Generalized anxiety disorder: Secondary | ICD-10-CM | POA: Diagnosis not present

## 2019-06-23 DIAGNOSIS — U071 COVID-19: Secondary | ICD-10-CM | POA: Diagnosis not present

## 2019-06-28 DIAGNOSIS — I10 Essential (primary) hypertension: Secondary | ICD-10-CM | POA: Diagnosis not present

## 2019-06-28 DIAGNOSIS — E119 Type 2 diabetes mellitus without complications: Secondary | ICD-10-CM | POA: Diagnosis not present

## 2019-06-28 DIAGNOSIS — Z7984 Long term (current) use of oral hypoglycemic drugs: Secondary | ICD-10-CM | POA: Diagnosis not present

## 2019-06-28 DIAGNOSIS — F411 Generalized anxiety disorder: Secondary | ICD-10-CM | POA: Diagnosis not present

## 2019-06-28 DIAGNOSIS — J1289 Other viral pneumonia: Secondary | ICD-10-CM | POA: Diagnosis not present

## 2019-06-28 DIAGNOSIS — U071 COVID-19: Secondary | ICD-10-CM | POA: Diagnosis not present

## 2019-06-29 DIAGNOSIS — F411 Generalized anxiety disorder: Secondary | ICD-10-CM | POA: Diagnosis not present

## 2019-06-29 DIAGNOSIS — U071 COVID-19: Secondary | ICD-10-CM | POA: Diagnosis not present

## 2019-06-29 DIAGNOSIS — E119 Type 2 diabetes mellitus without complications: Secondary | ICD-10-CM | POA: Diagnosis not present

## 2019-06-29 DIAGNOSIS — J1289 Other viral pneumonia: Secondary | ICD-10-CM | POA: Diagnosis not present

## 2019-06-29 DIAGNOSIS — I10 Essential (primary) hypertension: Secondary | ICD-10-CM | POA: Diagnosis not present

## 2019-06-29 DIAGNOSIS — Z7984 Long term (current) use of oral hypoglycemic drugs: Secondary | ICD-10-CM | POA: Diagnosis not present

## 2019-06-30 DIAGNOSIS — U071 COVID-19: Secondary | ICD-10-CM | POA: Diagnosis not present

## 2019-06-30 DIAGNOSIS — J1289 Other viral pneumonia: Secondary | ICD-10-CM | POA: Diagnosis not present

## 2019-06-30 DIAGNOSIS — I10 Essential (primary) hypertension: Secondary | ICD-10-CM | POA: Diagnosis not present

## 2019-06-30 DIAGNOSIS — F411 Generalized anxiety disorder: Secondary | ICD-10-CM | POA: Diagnosis not present

## 2019-06-30 DIAGNOSIS — E119 Type 2 diabetes mellitus without complications: Secondary | ICD-10-CM | POA: Diagnosis not present

## 2019-06-30 DIAGNOSIS — Z7984 Long term (current) use of oral hypoglycemic drugs: Secondary | ICD-10-CM | POA: Diagnosis not present

## 2019-07-01 ENCOUNTER — Telehealth: Payer: Self-pay | Admitting: Family Medicine

## 2019-07-01 DIAGNOSIS — U071 COVID-19: Secondary | ICD-10-CM | POA: Diagnosis not present

## 2019-07-01 DIAGNOSIS — Z7984 Long term (current) use of oral hypoglycemic drugs: Secondary | ICD-10-CM | POA: Diagnosis not present

## 2019-07-01 DIAGNOSIS — I10 Essential (primary) hypertension: Secondary | ICD-10-CM | POA: Diagnosis not present

## 2019-07-01 DIAGNOSIS — J1289 Other viral pneumonia: Secondary | ICD-10-CM | POA: Diagnosis not present

## 2019-07-01 DIAGNOSIS — E119 Type 2 diabetes mellitus without complications: Secondary | ICD-10-CM | POA: Diagnosis not present

## 2019-07-01 DIAGNOSIS — F411 Generalized anxiety disorder: Secondary | ICD-10-CM | POA: Diagnosis not present

## 2019-07-01 MED ORDER — HYDROXYZINE HCL 25 MG PO TABS: 25.0000 mg | ORAL_TABLET | Freq: Three times a day (TID) | ORAL | 0 refills | Status: DC | PRN

## 2019-07-01 NOTE — Telephone Encounter (Signed)
Prescription sent electronically to pharmacy. Patient notified. 

## 2019-07-01 NOTE — Telephone Encounter (Signed)
She said she has "itchy" skin again and Dr. Nicki Reaper called her in an "itch" pill last year that helped. No rash anywhere just feels itchy.  She doesn't remember the name of it, she thinks it was around August of last year. I don't see any allergy meds around that time in her chart.  [facility] # [phone] or you can call her cell #.

## 2019-07-01 NOTE — Telephone Encounter (Signed)
Hydroxyzine 25 numb 36 one tid prn

## 2019-07-05 DIAGNOSIS — Z7984 Long term (current) use of oral hypoglycemic drugs: Secondary | ICD-10-CM | POA: Diagnosis not present

## 2019-07-05 DIAGNOSIS — F411 Generalized anxiety disorder: Secondary | ICD-10-CM | POA: Diagnosis not present

## 2019-07-05 DIAGNOSIS — I10 Essential (primary) hypertension: Secondary | ICD-10-CM | POA: Diagnosis not present

## 2019-07-05 DIAGNOSIS — E119 Type 2 diabetes mellitus without complications: Secondary | ICD-10-CM | POA: Diagnosis not present

## 2019-07-05 DIAGNOSIS — J1289 Other viral pneumonia: Secondary | ICD-10-CM | POA: Diagnosis not present

## 2019-07-05 DIAGNOSIS — U071 COVID-19: Secondary | ICD-10-CM | POA: Diagnosis not present

## 2019-07-08 DIAGNOSIS — I10 Essential (primary) hypertension: Secondary | ICD-10-CM | POA: Diagnosis not present

## 2019-07-08 DIAGNOSIS — J1289 Other viral pneumonia: Secondary | ICD-10-CM | POA: Diagnosis not present

## 2019-07-08 DIAGNOSIS — F411 Generalized anxiety disorder: Secondary | ICD-10-CM | POA: Diagnosis not present

## 2019-07-08 DIAGNOSIS — E119 Type 2 diabetes mellitus without complications: Secondary | ICD-10-CM | POA: Diagnosis not present

## 2019-07-08 DIAGNOSIS — Z7984 Long term (current) use of oral hypoglycemic drugs: Secondary | ICD-10-CM | POA: Diagnosis not present

## 2019-07-08 DIAGNOSIS — U071 COVID-19: Secondary | ICD-10-CM | POA: Diagnosis not present

## 2019-07-09 ENCOUNTER — Emergency Department (HOSPITAL_COMMUNITY): Payer: Medicare Other

## 2019-07-09 ENCOUNTER — Emergency Department (HOSPITAL_COMMUNITY)
Admission: EM | Admit: 2019-07-09 | Discharge: 2019-07-09 | Disposition: A | Discharge status: Home / Self Care | Payer: Medicare Other | Attending: Emergency Medicine | Admitting: Emergency Medicine

## 2019-07-09 ENCOUNTER — Encounter (HOSPITAL_COMMUNITY): Payer: Self-pay | Admitting: Emergency Medicine

## 2019-07-09 ENCOUNTER — Other Ambulatory Visit: Payer: Self-pay

## 2019-07-09 DIAGNOSIS — Z7984 Long term (current) use of oral hypoglycemic drugs: Secondary | ICD-10-CM | POA: Insufficient documentation

## 2019-07-09 DIAGNOSIS — Z8616 Personal history of COVID-19: Secondary | ICD-10-CM | POA: Diagnosis not present

## 2019-07-09 DIAGNOSIS — M6281 Muscle weakness (generalized): Secondary | ICD-10-CM | POA: Diagnosis not present

## 2019-07-09 DIAGNOSIS — Z79899 Other long term (current) drug therapy: Secondary | ICD-10-CM | POA: Diagnosis not present

## 2019-07-09 DIAGNOSIS — E119 Type 2 diabetes mellitus without complications: Secondary | ICD-10-CM | POA: Diagnosis not present

## 2019-07-09 DIAGNOSIS — I10 Essential (primary) hypertension: Secondary | ICD-10-CM | POA: Insufficient documentation

## 2019-07-09 DIAGNOSIS — U071 COVID-19: Secondary | ICD-10-CM | POA: Diagnosis not present

## 2019-07-09 DIAGNOSIS — R262 Difficulty in walking, not elsewhere classified: Secondary | ICD-10-CM | POA: Insufficient documentation

## 2019-07-09 DIAGNOSIS — F411 Generalized anxiety disorder: Secondary | ICD-10-CM | POA: Diagnosis not present

## 2019-07-09 DIAGNOSIS — R531 Weakness: Secondary | ICD-10-CM

## 2019-07-09 DIAGNOSIS — J1289 Other viral pneumonia: Secondary | ICD-10-CM | POA: Diagnosis not present

## 2019-07-09 DIAGNOSIS — J9811 Atelectasis: Secondary | ICD-10-CM | POA: Diagnosis not present

## 2019-07-09 LAB — CBC WITH DIFFERENTIAL/PLATELET
Abs Immature Granulocytes: 0.09 10*3/uL — ABNORMAL HIGH (ref 0.00–0.07)
Basophils Absolute: 0 10*3/uL (ref 0.0–0.1)
Basophils Relative: 0 %
Eosinophils Absolute: 0.3 10*3/uL (ref 0.0–0.5)
Eosinophils Relative: 3 %
HCT: 41.3 % (ref 36.0–46.0)
Hemoglobin: 12.9 g/dL (ref 12.0–15.0)
Immature Granulocytes: 1 %
Lymphocytes Relative: 20 %
Lymphs Abs: 1.7 10*3/uL (ref 0.7–4.0)
MCH: 28.3 pg (ref 26.0–34.0)
MCHC: 31.2 g/dL (ref 30.0–36.0)
MCV: 90.6 fL (ref 80.0–100.0)
Monocytes Absolute: 0.5 10*3/uL (ref 0.1–1.0)
Monocytes Relative: 6 %
Neutro Abs: 5.8 10*3/uL (ref 1.7–7.7)
Neutrophils Relative %: 70 %
Platelets: 350 10*3/uL (ref 150–400)
RBC: 4.56 MIL/uL (ref 3.87–5.11)
RDW: 14.2 % (ref 11.5–15.5)
WBC: 8.4 10*3/uL (ref 4.0–10.5)
nRBC: 0 % (ref 0.0–0.2)

## 2019-07-09 LAB — COMPREHENSIVE METABOLIC PANEL
ALT: 16 U/L (ref 0–44)
AST: 17 U/L (ref 15–41)
Albumin: 4.3 g/dL (ref 3.5–5.0)
Alkaline Phosphatase: 89 U/L (ref 38–126)
Anion gap: 10 (ref 5–15)
BUN: 20 mg/dL (ref 8–23)
CO2: 25 mmol/L (ref 22–32)
Calcium: 9.5 mg/dL (ref 8.9–10.3)
Chloride: 102 mmol/L (ref 98–111)
Creatinine, Ser: 0.83 mg/dL (ref 0.44–1.00)
GFR calc Af Amer: >60 mL/min (ref >60)
GFR calc non Af Amer: >60 mL/min (ref >60)
Glucose, Bld: 164 mg/dL — ABNORMAL HIGH (ref 70–99)
Potassium: 4.1 mmol/L (ref 3.5–5.1)
Sodium: 137 mmol/L (ref 135–145)
Total Bilirubin: 0.6 mg/dL (ref 0.3–1.2)
Total Protein: 7.9 g/dL (ref 6.5–8.1)

## 2019-07-09 LAB — URINALYSIS, ROUTINE W REFLEX MICROSCOPIC
Bilirubin Urine: NEGATIVE
Glucose, UA: NEGATIVE mg/dL
Hgb urine dipstick: NEGATIVE
Ketones, ur: NEGATIVE mg/dL
Leukocytes,Ua: NEGATIVE
Nitrite: NEGATIVE
Protein, ur: NEGATIVE mg/dL
Specific Gravity, Urine: 1.005 (ref 1.005–1.030)
pH: 5 (ref 5.0–8.0)

## 2019-07-09 LAB — TROPONIN I (HIGH SENSITIVITY): Troponin I (High Sensitivity): 4 ng/L (ref <18)

## 2019-07-09 MED ORDER — AMLODIPINE BESYLATE 5 MG PO TABS
5.0000 mg | ORAL_TABLET | Freq: Once | ORAL | Status: AC
  Administered 2019-07-09: 20:00:00 5 mg via ORAL
  Filled 2019-07-09: qty 1

## 2019-07-09 MED ORDER — SODIUM CHLORIDE 0.9 % IV BOLUS (SEPSIS): 500.0000 mL | Freq: Once | INTRAVENOUS | Status: DC

## 2019-07-09 MED ORDER — AMLODIPINE BESYLATE 5 MG PO TABS: 5.0000 mg | ORAL_TABLET | Freq: Every day | ORAL | 0 refills | Status: DC

## 2019-07-09 MED ORDER — ALPRAZOLAM 0.5 MG PO TABS
0.2500 mg | ORAL_TABLET | Freq: Once | ORAL | Status: AC
  Administered 2019-07-09: 0.25 mg via ORAL
  Filled 2019-07-09: qty 1

## 2019-07-09 NOTE — Discharge Instructions (Addendum)
You were seen today because you were feeling unwell and your blood pressure was elevated. We did several blood tests and imaging tests which were all reassuring. We gave you a medicine for your blood pressure tonight. I think you may have ben slightly dehydrated causing you to feel weak when you stood up. We would like for you to check you blood pressures daily. Take your morning dose of amlodipine 5mg . In the evening, if you blood pressure is still greater that 150 on the top number then take an additional 5mg  at night. Please drink plenty of water. Thank you for allowing me to care for you today. Please return to the emergency department if you have new or worsening symptoms. Take your regular blood pressure medications as instructed.

## 2019-07-09 NOTE — ED Provider Notes (Signed)
[facility] Provider Note   CSN: [id number] Arrival date & time: 07/09/19  1502     History Chief Complaint  Patient presents with  . Hypertension    [name] is a 86 y.o. female.  Patient is a 86 year old female coming from assisted living with past medical history of hypertension, hyperlipidemia, diabetes presenting to the emergency department for generally feeling unwell.  Patient reports that every day she wakes up and walks a mile.  Reports that today while she was walking she felt like she was going to fall and needed to hold onto the railing to continue her walk.  Reports that throughout the day she began to feel progressively worsened.  Reports that she had her blood pressure checked at the nursing facility and it was significantly elevated with 123456 systolically.  Reports that she took her regular blood pressure medication this morning.  Reports that when she stands up she feels as though she is generally weak and is going to fall over.  Denies any dizziness, chest pain, shortness of breath, nausea, vomiting, dysuria, cough.  She was diagnosed with COVID-19 on 3 December and reports that she has recovered since then.  She denies any numbness, tingling, focal weakness, blurry vision, headache        Past Medical History:  Diagnosis Date  . Cognitive dysfunction 03/04/2019   Patient scores 21 out of 30 on [place] cognitive assessment September 2020  . Diabetes mellitus without complication ([place])   . Diverticulitis   . Frailty 03/04/2019  . H/O bilateral breast reduction surgery   . Hyperlipidemia    a. intolerant to statins.   . Hypertension     Patient Active Problem List   Diagnosis Date Noted  . Cognitive dysfunction 03/04/2019  . Frailty 03/04/2019  . Generalized anxiety disorder 06/05/2018  . Vomiting and diarrhea 03/08/2018  . Dehydration 03/08/2018  . Hyponatremia 03/08/2018  . C. difficile diarrhea 03/03/2018  . Acute lower UTI  03/03/2018  . Hyperlipidemia associated with type 2 diabetes mellitus ([place]) 10/02/2016  . Pedal edema 10/02/2016  . Insomnia 10/02/2016  . SBO (small bowel obstruction) ([place]) 07/02/2016  . Dyspnea on exertion 02/24/2016  . Other fatigue   . E-coli UTI 06/18/2015  . Partial small bowel obstruction ([place]) 06/15/2015  . Ileitis, regional, with intestinal obstruction 06/15/2015  . Leukocytosis 12/16/2014  . Nausea and vomiting 12/15/2014  . HLD (hyperlipidemia) 12/15/2014  . Essential hypertension 12/15/2014  . Controlled type 2 diabetes mellitus without complication, without long-term current use of insulin ([place]) 02/15/2011    Past Surgical History:  Procedure Laterality Date  . ABDOMINAL HYSTERECTOMY    . APPENDECTOMY    . BREAST REDUCTION SURGERY    . COLON SURGERY Left    Hemicolectomy due to diverticulitis  . EYE SURGERY  03/30/2009   cataract  . KNEE SURGERY    . LAPAROSCOPIC INCISIONAL / UMBILICAL / VENTRAL HERNIA REPAIR  02/23/2007   . REDUCTION MAMMAPLASTY Bilateral 2001  . REFRACTIVE SURGERY       OB History    Gravida      Para      Term      Preterm      AB      Living  1     SAB      TAB      Ectopic      Multiple      Live Births  Family History  Problem Relation Age of Onset  . Hypertension Father        kidney  . Stroke Father   . Cancer Father   . Cancer Mother        ovaian  . Diabetes Brother   . Hypertension Sister     Social History   Tobacco Use  . Smoking status: Never Smoker  . Smokeless tobacco: Never Used  Substance Use Topics  . Alcohol use: No  . Drug use: No    Home Medications Prior to Admission medications   Medication Sig Start Date End Date Taking? Authorizing Provider  ACETAMINOPHEN EXTRA STRENGTH 500 MG tablet TAKE 2 TABLETS BY MOUTH EVERY 4 HOURS AS NEEDED FOR FEVER.**DO NOT EXCEED 8 TABLETS IN 24 HOURS** 06/14/19  Yes Luking, Elayne Snare, MD  ALPRAZolam (XANAX) 0.25 MG tablet Take one tablet  po mouth TID for anxiety. 8am, 2pm, 8pm for next 12 days then 8 am and 8 pm 06/02/19  Yes Luking, Scott A, MD  amLODipine (NORVASC) 5 MG tablet Take 1 tablet (5 mg total) by mouth daily. 05/28/19  Yes Kathyrn Drown, MD  beta carotene w/minerals (OCUVITE) tablet TAKE 1 TABLET BY MOUTH AT BEDTIME. 04/12/19  Yes Luking, Elayne Snare, MD  Cyanocobalamin 2500 MCG TABS Take 2,500 mcg by mouth daily.    Yes [provider]  dicyclomine (BENTYL) 10 MG capsule 1 bid prn abd cramps and loose stool 03/23/19  Yes Luking, Scott A, MD  famotidine (PEPCID) 20 MG tablet TAKE 1 TABLET BY MOUTH TWICE DAILY. 04/13/19  Yes Kathyrn Drown, MD  hydrochlorothiazide (HYDRODIURIL) 25 MG tablet Take 1 tablet (25 mg total) by mouth daily. 01/04/19  Yes Kathyrn Drown, MD  hydrOXYzine (ATARAX/VISTARIL) 25 MG tablet Take 1 tablet (25 mg total) by mouth 3 (three) times daily as needed for itching. 07/01/19  Yes Mikey Kirschner, MD  lactobacillus acidophilus (BACID) TABS tablet Take 2 tablets by mouth 3 (three) times daily.   Yes [provider]  losartan (COZAAR) 100 MG tablet TAKE 1 TABLET BY MOUTH ONCE DAILY. 06/14/19  Yes Luking, Elayne Snare, MD  metFORMIN (GLUCOPHAGE) 1000 MG tablet TAKE 1/2 TABLET BY MOUTH TWICE DAILY. 04/13/19  Yes Kathyrn Drown, MD  OVER THE COUNTER MEDICATION Vitamin b -12 2500 mcg sublingual tablet #15 take one half tablet by mouth at bedtime. ( dr scott hand wrote rx on 04/15/19 and gave one year of refills since not able to find in epic)   Yes [provider]  pantoprazole (PROTONIX) 20 MG tablet TAKE (1) TABLET BY MOUTH ONCE DAILY. 06/14/19  Yes Kathyrn Drown, MD  saccharomyces boulardii (FLORASTOR) 250 MG capsule Take 1 capsule (250 mg total) by mouth 2 (two) times daily. 03/12/18  Yes Kathie Dike, MD  Vitamin D, Cholecalciferol, 10 MCG (400 UNIT) TABS TAKE 1 TABLET BY MOUTH ONCE DAILY. 04/13/19  Yes Luking, Elayne Snare, MD  amLODipine (NORVASC) 5 MG tablet Take 1 tablet (5 mg  total) by mouth at bedtime for 3 days. Take before bed if blood pressure still elevated >150/90 07/09/19 07/12/19  Madilyn Hook A, PA-C  azithromycin (ZITHROMAX Z-PAK) 250 MG tablet Take 2 tablets (500 mg) on  Day 1,  followed by 1 tablet (250 mg) once daily on Days 2 through 5. 06/10/19   Kathyrn Drown, MD  cefdinir (OMNICEF) 300 MG capsule Take one tablet po BID for 7 days 06/02/19   Kathyrn Drown, MD  nitrofurantoin,  macrocrystal-monohydrate, (MACROBID) 100 MG capsule Take one capsule po BID for 7 days Patient not taking: Reported on 06/02/2019 05/28/19   Mikey Kirschner, MD    Allergies    Lisinopril, Phenergan [promethazine hcl], Statins, Trazodone and nefazodone, and Prednisone  Review of Systems   Review of Systems  Constitutional: Positive for fatigue. Negative for activity change, appetite change, chills, diaphoresis, fever and unexpected weight change.  HENT: Negative for congestion and sore throat.   Eyes: Negative for visual disturbance.  Respiratory: Negative for cough and shortness of breath.   Cardiovascular: Negative for chest pain, palpitations and leg swelling.  Gastrointestinal: Negative for abdominal pain, diarrhea, nausea and vomiting.  Genitourinary: Negative for dysuria.  Musculoskeletal: Positive for gait problem. Negative for arthralgias and back pain.  Skin: Negative for rash and wound.  Neurological: Positive for weakness (generalized) and light-headedness. Negative for dizziness, tremors, seizures, syncope, facial asymmetry, speech difficulty, numbness and headaches.  Psychiatric/Behavioral: Negative for confusion.    Physical Exam Updated Vital Signs BP (S) (!) 180/82 (BP Location: Right Arm) Comment: pt seems worried about BP  Pulse 100   Temp (!) 97.5 F (36.4 C) (Oral)   Resp 16   Ht 5\' 8"  (1.727 m)   Wt 74.8 kg   SpO2 100%   BMI 25.09 kg/m   Physical Exam Vitals and nursing note reviewed.  Constitutional:      General: She is not in acute  distress.    Appearance: Normal appearance. She is not ill-appearing, toxic-appearing or diaphoretic.  HENT:     Head: Normocephalic.     Nose: Nose normal.     Mouth/Throat:     Mouth: Mucous membranes are moist.  Eyes:     Extraocular Movements:     Right eye: Normal extraocular motion and no nystagmus.     Left eye: Normal extraocular motion and no nystagmus.     Conjunctiva/sclera: Conjunctivae normal.     Pupils: Pupils are equal, round, and reactive to light.     Comments: No nystagmus  Cardiovascular:     Rate and Rhythm: Normal rate and regular rhythm.     Pulses: Normal pulses.  Pulmonary:     Effort: Pulmonary effort is normal.     Breath sounds: Normal breath sounds.  Abdominal:     General: Abdomen is flat.  Musculoskeletal:        General: No tenderness or deformity. Normal range of motion.     Right lower leg: No edema.     Left lower leg: No edema.     Comments: Patient has normal UE and LE strength and sensation bilaterally.   Skin:    General: Skin is dry.  Neurological:     General: No focal deficit present.     Mental Status: She is alert and oriented to person, place, and time.     Comments: Patient stood and ambulated without assistance but reports feeling "woozy" and afraid of falling.  Psychiatric:        Mood and Affect: Mood normal.     ED Results / Procedures / Treatments   Labs (all labs ordered are listed, but only abnormal results are displayed) Labs Reviewed  COMPREHENSIVE METABOLIC PANEL - Abnormal; Notable for the following components:      Result Value   Glucose, Bld 164 (*)    All other components within normal limits  CBC WITH DIFFERENTIAL/PLATELET - Abnormal; Notable for the following components:   Abs Immature Granulocytes 0.09 (*)  All other components within normal limits  URINALYSIS, ROUTINE W REFLEX MICROSCOPIC  TROPONIN I (HIGH SENSITIVITY)    EKG EKG Interpretation  Date/Time:  Friday July 09 2019 20:20:27 EST  Ventricular Rate:  86 PR Interval:    QRS Duration: 86 QT Interval:  370 QTC Calculation: 443 R Axis:   56 Text Interpretation: Sinus rhythm Anterior infarct, old No significant change since last tracing Confirmed by Isla Pence [phone]) on 07/09/2019 8:26:55 PM   Radiology DG Chest 2 View  Result Date: 07/09/2019 CLINICAL DATA:  Weakness EXAM: CHEST - 2 VIEW COMPARISON:  05/27/2019 FINDINGS: Cardiac shadow is stable in appearance from the prior exam. Aortic calcifications are seen. The lungs are well aerated bilaterally. Mild right basilar atelectasis is seen. No sizable effusion is noted. No focal infiltrate is seen. IMPRESSION: Mild right basilar atelectasis. Electronically Signed   By: Inez Catalina M.D.   On: 07/09/2019 20:58   CT Head Wo Contrast  Result Date: 07/09/2019 CLINICAL DATA:  Hypertension and difficulty ambulating EXAM: CT HEAD WITHOUT CONTRAST TECHNIQUE: Contiguous axial images were obtained from the base of the skull through the vertex without intravenous contrast. COMPARISON:  01/05/2017 FINDINGS: Brain: Mild atrophic changes are noted. No findings to suggest acute hemorrhage, acute infarction or space-occupying mass lesion are seen. Vascular: No hyperdense vessel or unexpected calcification. Skull: Normal. Negative for fracture or focal lesion. Sinuses/Orbits: No acute finding. Other: None. IMPRESSION: Mild atrophic changes without acute abnormality. Electronically Signed   By: Inez Catalina M.D.   On: 07/09/2019 20:56    Procedures Procedures (including critical care time)  Medications Ordered in ED Medications  ALPRAZolam (XANAX) tablet 0.25 mg (has no administration in time range)  amLODipine (NORVASC) tablet 5 mg (5 mg Oral Given 07/09/19 2022)    ED Course  I have reviewed the triage vital signs and the nursing notes.  Pertinent labs & imaging results that were available during my care of the patient were reviewed by me and considered in my medical decision making  (see chart for details).  Clinical Course as of Jul 09 2215  Fri Jul 09, 2019  1945 BPMarland Kitchen)(S): 180/82 [KM]  2017 Elderly patient from assisted living here for generalized weakness and feeling unwell with standing which started today this morning. HTN to 123456 systolic at nursing home and 180s here in ED. Has taken BP meds today.    [KM]  2147 Patient is just mildly orthostatic with systolic pressure dropping from 174 to 155 with standing. Patient tolerating oral hydration. Given amlodipine dose for her BP. Labs are reassuring including trop and EKG, chest xray and head CT unremarkable. Waiting on urinalysis. If normal, patient can be d/c home    [KM]    Clinical Course User Index [KM] Kristine Royal   MDM Rules/Calculators/A&P                      Based on review of vitals, medical screening exam, lab work and/or imaging, there does not appear to be an acute, emergent etiology for the patient's symptoms. Counseled pt on good return precautions and encouraged both PCP and ED follow-up as needed.  Prior to discharge, I also discussed incidental imaging findings with patient in detail and advised appropriate, recommended follow-up in detail.  Clinical Impression: 1. Hypertension, unspecified type   2. Generalized weakness     Disposition: Discharge  Prior to providing a prescription for a controlled substance, I independently reviewed the patient's recent prescription history  on the [place] Controlled Substance Reporting System. The patient had no recent or regular prescriptions and was deemed appropriate for a brief, less than 3 day prescription of narcotic for acute analgesia.  This note was prepared with assistance of Systems analyst. Occasional wrong-word or sound-a-like substitutions may have occurred due to the inherent limitations of voice recognition software.  Final Clinical Impression(s) / ED Diagnoses Final diagnoses:  Hypertension, unspecified  type  Generalized weakness    Rx / DC Orders ED Discharge Orders         Ordered    amLODipine (NORVASC) 5 MG tablet  Daily at bedtime     07/09/19 2213           Kristine Royal 07/09/19 2217    Isla Pence, MD 07/09/19 2302

## 2019-07-09 NOTE — ED Triage Notes (Signed)
Patient sent from [facility] for hypertension. Patient states she feels unsteady and has had to hold on to things to walk. Normally walks 3 miles a day.

## 2019-07-09 NOTE — ED Notes (Signed)
Pt given water to drink- tolerating well

## 2019-07-09 NOTE — ED Notes (Addendum)
Pt ambulatory to bathroom with standby assist from this nurse- pt back to chair in [place]- pt reports feeling a "little woozy". Pt states she is "scared she will fall" Reassurance verbalized. Urine sample obtained and given to Marily Memos, Warehouse manager

## 2019-07-13 ENCOUNTER — Other Ambulatory Visit: Payer: Self-pay

## 2019-07-13 ENCOUNTER — Ambulatory Visit (INDEPENDENT_AMBULATORY_CARE_PROVIDER_SITE_OTHER): Payer: Medicare Other | Admitting: Family Medicine

## 2019-07-13 DIAGNOSIS — U071 COVID-19: Secondary | ICD-10-CM | POA: Diagnosis not present

## 2019-07-13 DIAGNOSIS — Z7984 Long term (current) use of oral hypoglycemic drugs: Secondary | ICD-10-CM | POA: Diagnosis not present

## 2019-07-13 DIAGNOSIS — I10 Essential (primary) hypertension: Secondary | ICD-10-CM | POA: Diagnosis not present

## 2019-07-13 DIAGNOSIS — J1289 Other viral pneumonia: Secondary | ICD-10-CM | POA: Diagnosis not present

## 2019-07-13 DIAGNOSIS — F411 Generalized anxiety disorder: Secondary | ICD-10-CM | POA: Diagnosis not present

## 2019-07-13 DIAGNOSIS — E119 Type 2 diabetes mellitus without complications: Secondary | ICD-10-CM | POA: Diagnosis not present

## 2019-07-13 MED ORDER — AMLODIPINE BESYLATE 10 MG PO TABS: 10.0000 mg | ORAL_TABLET | Freq: Every day | ORAL | 5 refills | Status: DC

## 2019-07-13 NOTE — Progress Notes (Signed)
   Subjective:    Patient ID: [name], female    DOB: 25-Jul-1931, 86 y.o.   MRN: [id number]  HPI Pt here for ER follow up. Pt went to ER on 07/09/2019 due to elevated blood pressure. Madaline Savage at [facility] states pt did have some lightheadedness on Friday before she went to ER. Current blood pressure readings have been 172/71;171/73;179/69 and this morning her blood pressure before meds was 186/69. Pt was prescribed Amlodipine 5 mg for 3 nights after discharge.  Patient's blood pressure running higher than normal.  She does watch her salt in diet try to stay active had recent Covid infection Start to do better Energy level improving Denies headaches or chest pain  Virtual Visit via Telephone Note  I connected with [name] on 07/13/19 at 10:00 AM EST by telephone and verified that I am speaking with the correct person using two identifiers.  Location: Patient: home Provider: office   I discussed the limitations, risks, security and privacy concerns of performing an evaluation and management service by telephone and the availability of in person appointments. I also discussed with the patient that there may be a patient responsible charge related to this service. The patient expressed understanding and agreed to proceed.   History of Present Illness:    Observations/Objective:   Assessment and Plan:   Follow Up Instructions:    I discussed the assessment and treatment plan with the patient. The patient was provided an opportunity to ask questions and all were answered. The patient agreed with the plan and demonstrated an understanding of the instructions.   The patient was advised to call back or seek an in-person evaluation if the symptoms worsen or if the condition fails to improve as anticipated.  I provided 16 minutes of non-face-to-face time during this encounter.       Review of Systems  Constitutional: Negative for activity change, appetite change and  fatigue.  HENT: Negative for congestion and rhinorrhea.   Respiratory: Negative for cough and shortness of breath.   Cardiovascular: Negative for chest pain and leg swelling.  Gastrointestinal: Negative for abdominal pain and diarrhea.  Endocrine: Negative for polydipsia and polyphagia.  Skin: Negative for color change.  Neurological: Negative for dizziness and weakness.  Psychiatric/Behavioral: Negative for behavioral problems and confusion.       Objective:   Physical Exam   Today's visit was via telephone Physical exam was not possible for this visit      Assessment & Plan:  Hypertension uncontrolled on her current measures bump up the dose of amlodipine continue everything else as is go with 10 mg daily.  Follow-up again within 2 months time follow-up sooner if any problems.

## 2019-07-14 DIAGNOSIS — E119 Type 2 diabetes mellitus without complications: Secondary | ICD-10-CM | POA: Diagnosis not present

## 2019-07-14 DIAGNOSIS — I10 Essential (primary) hypertension: Secondary | ICD-10-CM | POA: Diagnosis not present

## 2019-07-14 DIAGNOSIS — F411 Generalized anxiety disorder: Secondary | ICD-10-CM | POA: Diagnosis not present

## 2019-07-14 DIAGNOSIS — J1289 Other viral pneumonia: Secondary | ICD-10-CM | POA: Diagnosis not present

## 2019-07-14 DIAGNOSIS — U071 COVID-19: Secondary | ICD-10-CM | POA: Diagnosis not present

## 2019-07-14 DIAGNOSIS — Z7984 Long term (current) use of oral hypoglycemic drugs: Secondary | ICD-10-CM | POA: Diagnosis not present

## 2019-07-15 DIAGNOSIS — E119 Type 2 diabetes mellitus without complications: Secondary | ICD-10-CM | POA: Diagnosis not present

## 2019-07-15 DIAGNOSIS — U071 COVID-19: Secondary | ICD-10-CM | POA: Diagnosis not present

## 2019-07-15 DIAGNOSIS — F411 Generalized anxiety disorder: Secondary | ICD-10-CM | POA: Diagnosis not present

## 2019-07-15 DIAGNOSIS — Z7984 Long term (current) use of oral hypoglycemic drugs: Secondary | ICD-10-CM | POA: Diagnosis not present

## 2019-07-15 DIAGNOSIS — I10 Essential (primary) hypertension: Secondary | ICD-10-CM | POA: Diagnosis not present

## 2019-07-15 DIAGNOSIS — J1289 Other viral pneumonia: Secondary | ICD-10-CM | POA: Diagnosis not present

## 2019-07-16 DIAGNOSIS — J1289 Other viral pneumonia: Secondary | ICD-10-CM | POA: Diagnosis not present

## 2019-07-16 DIAGNOSIS — E119 Type 2 diabetes mellitus without complications: Secondary | ICD-10-CM | POA: Diagnosis not present

## 2019-07-16 DIAGNOSIS — F411 Generalized anxiety disorder: Secondary | ICD-10-CM | POA: Diagnosis not present

## 2019-07-16 DIAGNOSIS — Z7984 Long term (current) use of oral hypoglycemic drugs: Secondary | ICD-10-CM | POA: Diagnosis not present

## 2019-07-16 DIAGNOSIS — I10 Essential (primary) hypertension: Secondary | ICD-10-CM | POA: Diagnosis not present

## 2019-07-16 DIAGNOSIS — U071 COVID-19: Secondary | ICD-10-CM | POA: Diagnosis not present

## 2019-07-19 DIAGNOSIS — J1289 Other viral pneumonia: Secondary | ICD-10-CM | POA: Diagnosis not present

## 2019-07-19 DIAGNOSIS — Z7984 Long term (current) use of oral hypoglycemic drugs: Secondary | ICD-10-CM | POA: Diagnosis not present

## 2019-07-19 DIAGNOSIS — I10 Essential (primary) hypertension: Secondary | ICD-10-CM | POA: Diagnosis not present

## 2019-07-19 DIAGNOSIS — F411 Generalized anxiety disorder: Secondary | ICD-10-CM | POA: Diagnosis not present

## 2019-07-19 DIAGNOSIS — E119 Type 2 diabetes mellitus without complications: Secondary | ICD-10-CM | POA: Diagnosis not present

## 2019-07-19 DIAGNOSIS — U071 COVID-19: Secondary | ICD-10-CM | POA: Diagnosis not present

## 2019-07-20 DIAGNOSIS — U071 COVID-19: Secondary | ICD-10-CM | POA: Diagnosis not present

## 2019-07-20 DIAGNOSIS — F411 Generalized anxiety disorder: Secondary | ICD-10-CM | POA: Diagnosis not present

## 2019-07-20 DIAGNOSIS — J1289 Other viral pneumonia: Secondary | ICD-10-CM | POA: Diagnosis not present

## 2019-07-20 DIAGNOSIS — I10 Essential (primary) hypertension: Secondary | ICD-10-CM | POA: Diagnosis not present

## 2019-07-20 DIAGNOSIS — Z7984 Long term (current) use of oral hypoglycemic drugs: Secondary | ICD-10-CM | POA: Diagnosis not present

## 2019-07-20 DIAGNOSIS — E119 Type 2 diabetes mellitus without complications: Secondary | ICD-10-CM | POA: Diagnosis not present

## 2019-07-21 DIAGNOSIS — F411 Generalized anxiety disorder: Secondary | ICD-10-CM | POA: Diagnosis not present

## 2019-07-21 DIAGNOSIS — U071 COVID-19: Secondary | ICD-10-CM | POA: Diagnosis not present

## 2019-07-21 DIAGNOSIS — I10 Essential (primary) hypertension: Secondary | ICD-10-CM | POA: Diagnosis not present

## 2019-07-21 DIAGNOSIS — E119 Type 2 diabetes mellitus without complications: Secondary | ICD-10-CM | POA: Diagnosis not present

## 2019-07-21 DIAGNOSIS — H25812 Combined forms of age-related cataract, left eye: Secondary | ICD-10-CM | POA: Diagnosis not present

## 2019-07-21 DIAGNOSIS — J1289 Other viral pneumonia: Secondary | ICD-10-CM | POA: Diagnosis not present

## 2019-07-21 DIAGNOSIS — Z7984 Long term (current) use of oral hypoglycemic drugs: Secondary | ICD-10-CM | POA: Diagnosis not present

## 2019-07-22 DIAGNOSIS — I10 Essential (primary) hypertension: Secondary | ICD-10-CM | POA: Diagnosis not present

## 2019-07-22 DIAGNOSIS — F411 Generalized anxiety disorder: Secondary | ICD-10-CM | POA: Diagnosis not present

## 2019-07-22 DIAGNOSIS — U071 COVID-19: Secondary | ICD-10-CM | POA: Diagnosis not present

## 2019-07-22 DIAGNOSIS — E119 Type 2 diabetes mellitus without complications: Secondary | ICD-10-CM | POA: Diagnosis not present

## 2019-07-22 DIAGNOSIS — Z7984 Long term (current) use of oral hypoglycemic drugs: Secondary | ICD-10-CM | POA: Diagnosis not present

## 2019-07-22 DIAGNOSIS — J1289 Other viral pneumonia: Secondary | ICD-10-CM | POA: Diagnosis not present

## 2019-07-26 ENCOUNTER — Telehealth: Payer: Self-pay | Admitting: Family Medicine

## 2019-07-26 DIAGNOSIS — J1289 Other viral pneumonia: Secondary | ICD-10-CM | POA: Diagnosis not present

## 2019-07-26 DIAGNOSIS — I10 Essential (primary) hypertension: Secondary | ICD-10-CM | POA: Diagnosis not present

## 2019-07-26 DIAGNOSIS — F411 Generalized anxiety disorder: Secondary | ICD-10-CM | POA: Diagnosis not present

## 2019-07-26 DIAGNOSIS — U071 COVID-19: Secondary | ICD-10-CM | POA: Diagnosis not present

## 2019-07-26 DIAGNOSIS — E119 Type 2 diabetes mellitus without complications: Secondary | ICD-10-CM | POA: Diagnosis not present

## 2019-07-26 DIAGNOSIS — Z7984 Long term (current) use of oral hypoglycemic drugs: Secondary | ICD-10-CM | POA: Diagnosis not present

## 2019-07-26 NOTE — Telephone Encounter (Signed)
[facility].  On the papers it states that she is taking amlodipine 5 mg daily yet on her last visit virtual with [place] approximately 12 days ago my understanding was she would be doing 10 mg amlodipine daily.  Please clarify with the rest home is she taking 5 mg or 10 mg?  (Blood pressure readings do not look terrible but are above what we would like to see one reading 178/67, 147/64, 156/64, 160/64)

## 2019-07-27 MED ORDER — HYDRALAZINE HCL 25 MG PO TABS: ORAL_TABLET | ORAL | 4 refills | Status: DC

## 2019-07-27 NOTE — Telephone Encounter (Signed)
Also tried to contact patient, voicemail message came on but unable to leave message

## 2019-07-27 NOTE — Addendum Note (Signed)
Addended by: Vicente Males on: 07/27/2019 11:12 AM   Modules accepted: Orders

## 2019-07-27 NOTE — Telephone Encounter (Signed)
Medication sent to pharmacy. Orders written out to be faxed to [facility]. Attempted to contact facility to inform them of med and to check blood pressure close, phone rang once and then hung up; attempted x3.

## 2019-07-27 NOTE — Telephone Encounter (Signed)
Based on the elevated blood pressure readings continue current medications Add hydralazine 25 mg twice daily, #60, 4 refills Follow blood pressure closely send [place] update within 7 to 10 days If any low blood pressures notify us sooner

## 2019-07-27 NOTE — Telephone Encounter (Signed)
Contacted [facility] and spoke with Catherine. Pt is taking Amlodipine 10 mg once daily.

## 2019-07-28 NOTE — Telephone Encounter (Signed)
Spoke with [name] at [facility] and informed of medication added. [name] states they did get the med and the orders that were faxed over. Currently, facility is checking blood pressure once a day on Monday, Wednesday and Friday. [name] needing to know how often to check blood pressure and also needing order of how often to check BP faxed to them. Please advise. Thank you

## 2019-07-28 NOTE — Telephone Encounter (Signed)
It is fine to maintain checking blood pressure 3 times per week send [place] readings every 2 weeks  Once we see things are going better we can reduce the frequency

## 2019-07-28 NOTE — Telephone Encounter (Signed)
Contacted [facility] and spoke with [place]. Informed sylvia that is ok to maintain checking BP 3 time per week and to send reading every 2 weeks. Sunday Spillers verbalized understanding.

## 2019-07-30 DIAGNOSIS — I10 Essential (primary) hypertension: Secondary | ICD-10-CM | POA: Diagnosis not present

## 2019-07-30 DIAGNOSIS — U071 COVID-19: Secondary | ICD-10-CM | POA: Diagnosis not present

## 2019-07-30 DIAGNOSIS — J1289 Other viral pneumonia: Secondary | ICD-10-CM | POA: Diagnosis not present

## 2019-07-30 DIAGNOSIS — E119 Type 2 diabetes mellitus without complications: Secondary | ICD-10-CM | POA: Diagnosis not present

## 2019-07-30 DIAGNOSIS — F411 Generalized anxiety disorder: Secondary | ICD-10-CM | POA: Diagnosis not present

## 2019-07-30 DIAGNOSIS — Z7984 Long term (current) use of oral hypoglycemic drugs: Secondary | ICD-10-CM | POA: Diagnosis not present

## 2019-08-02 DIAGNOSIS — F411 Generalized anxiety disorder: Secondary | ICD-10-CM | POA: Diagnosis not present

## 2019-08-02 DIAGNOSIS — Z7984 Long term (current) use of oral hypoglycemic drugs: Secondary | ICD-10-CM | POA: Diagnosis not present

## 2019-08-02 DIAGNOSIS — Z23 Encounter for immunization: Secondary | ICD-10-CM | POA: Diagnosis not present

## 2019-08-02 DIAGNOSIS — U071 COVID-19: Secondary | ICD-10-CM | POA: Diagnosis not present

## 2019-08-02 DIAGNOSIS — E119 Type 2 diabetes mellitus without complications: Secondary | ICD-10-CM | POA: Diagnosis not present

## 2019-08-02 DIAGNOSIS — I10 Essential (primary) hypertension: Secondary | ICD-10-CM | POA: Diagnosis not present

## 2019-08-02 DIAGNOSIS — J1289 Other viral pneumonia: Secondary | ICD-10-CM | POA: Diagnosis not present

## 2019-08-06 ENCOUNTER — Other Ambulatory Visit: Payer: Self-pay | Admitting: Family Medicine

## 2019-08-06 NOTE — Telephone Encounter (Signed)
Er follow up1/21/21

## 2019-08-30 DIAGNOSIS — Z23 Encounter for immunization: Secondary | ICD-10-CM | POA: Diagnosis not present

## 2019-09-06 ENCOUNTER — Telehealth: Payer: Self-pay | Admitting: Family Medicine

## 2019-09-06 DIAGNOSIS — E7849 Other hyperlipidemia: Secondary | ICD-10-CM

## 2019-09-06 DIAGNOSIS — I10 Essential (primary) hypertension: Secondary | ICD-10-CM

## 2019-09-06 DIAGNOSIS — E119 Type 2 diabetes mellitus without complications: Secondary | ICD-10-CM

## 2019-09-06 NOTE — Telephone Encounter (Signed)
Patient is wondering if she needs her A1C checked and labs. She was stating hadnt had them done in awhile. Please Advise [name]-At [facility]-[phone] if after 3:30 call patient [phone]

## 2019-09-06 NOTE — Telephone Encounter (Signed)
Pt had Troponin, CBC, CMP and urinalyis in hospital on 07/09/2019. Dr.Kohut did Troponin, BMET and CBC in November 2020. BMET and A1C ordered by [place] on 04/07/2019. Please advise. Thank you

## 2019-09-07 ENCOUNTER — Other Ambulatory Visit: Payer: Self-pay | Admitting: Family Medicine

## 2019-09-07 NOTE — Telephone Encounter (Signed)
Lipid, metabolic 7, 123456 This lab work is not due until the first week of April Please order, please mail the papers, please inform patient first week of April In the springtime would like to do a in person visit here if possible (if [facility] allows) if not virtual visit-after lab work of course

## 2019-09-07 NOTE — Telephone Encounter (Signed)
Contacted [facility] and spoke with [place]. Lab orders placed and mailed to patient. Informed Edd Fabian that labs would need to be the first week of April and provider would like an in office follow up visit after lab work. Gayle transferred up front to set that appt up.

## 2019-09-14 ENCOUNTER — Other Ambulatory Visit: Payer: Self-pay | Admitting: Family Medicine

## 2019-09-16 DIAGNOSIS — M1711 Unilateral primary osteoarthritis, right knee: Secondary | ICD-10-CM | POA: Diagnosis not present

## 2019-09-20 NOTE — Telephone Encounter (Signed)
Patient's appointment is 4/15

## 2019-09-23 DIAGNOSIS — M1711 Unilateral primary osteoarthritis, right knee: Secondary | ICD-10-CM | POA: Diagnosis not present

## 2019-09-28 DIAGNOSIS — U071 COVID-19: Secondary | ICD-10-CM | POA: Diagnosis not present

## 2019-09-28 DIAGNOSIS — Z20828 Contact with and (suspected) exposure to other viral communicable diseases: Secondary | ICD-10-CM | POA: Diagnosis not present

## 2019-09-30 DIAGNOSIS — M1711 Unilateral primary osteoarthritis, right knee: Secondary | ICD-10-CM | POA: Diagnosis not present

## 2019-10-01 DIAGNOSIS — E7849 Other hyperlipidemia: Secondary | ICD-10-CM | POA: Diagnosis not present

## 2019-10-01 DIAGNOSIS — I10 Essential (primary) hypertension: Secondary | ICD-10-CM | POA: Diagnosis not present

## 2019-10-01 DIAGNOSIS — E119 Type 2 diabetes mellitus without complications: Secondary | ICD-10-CM | POA: Diagnosis not present

## 2019-10-02 LAB — BASIC METABOLIC PANEL
BUN/Creatinine Ratio: 16 (ref 12–28)
BUN: 16 mg/dL (ref 8–27)
CO2: 24 mmol/L (ref 20–29)
Calcium: 10.1 mg/dL (ref 8.7–10.3)
Chloride: 102 mmol/L (ref 96–106)
Creatinine, Ser: 1.02 mg/dL — ABNORMAL HIGH (ref 0.57–1.00)
GFR calc Af Amer: 58 mL/min/{1.73_m2} — ABNORMAL LOW (ref >59)
GFR calc non Af Amer: 50 mL/min/{1.73_m2} — ABNORMAL LOW (ref >59)
Glucose: 111 mg/dL — ABNORMAL HIGH (ref 65–99)
Potassium: 4.6 mmol/L (ref 3.5–5.2)
Sodium: 140 mmol/L (ref 134–144)

## 2019-10-02 LAB — LIPID PANEL
Chol/HDL Ratio: 4.6 ratio — ABNORMAL HIGH (ref 0.0–4.4)
Cholesterol, Total: 216 mg/dL — ABNORMAL HIGH (ref 100–199)
HDL: 47 mg/dL (ref >39)
LDL Chol Calc (NIH): 140 mg/dL — ABNORMAL HIGH (ref 0–99)
Triglycerides: 164 mg/dL — ABNORMAL HIGH (ref 0–149)
VLDL Cholesterol Cal: 29 mg/dL (ref 5–40)

## 2019-10-02 LAB — HEMOGLOBIN A1C
Est. average glucose Bld gHb Est-mCnc: 140 mg/dL
Hgb A1c MFr Bld: 6.5 % — ABNORMAL HIGH (ref 4.8–5.6)

## 2019-10-07 ENCOUNTER — Other Ambulatory Visit: Payer: Self-pay | Admitting: Family Medicine

## 2019-10-11 DIAGNOSIS — Z20828 Contact with and (suspected) exposure to other viral communicable diseases: Secondary | ICD-10-CM | POA: Diagnosis not present

## 2019-10-11 DIAGNOSIS — U071 COVID-19: Secondary | ICD-10-CM | POA: Diagnosis not present

## 2019-10-14 ENCOUNTER — Other Ambulatory Visit: Payer: Self-pay

## 2019-10-14 ENCOUNTER — Encounter: Payer: Self-pay | Admitting: Family Medicine

## 2019-10-14 ENCOUNTER — Ambulatory Visit (INDEPENDENT_AMBULATORY_CARE_PROVIDER_SITE_OTHER): Payer: Medicare Other | Admitting: Family Medicine

## 2019-10-14 VITALS — BP 138/84 | Temp 98.2°F | Wt 166.2 lb

## 2019-10-14 DIAGNOSIS — E1169 Type 2 diabetes mellitus with other specified complication: Secondary | ICD-10-CM

## 2019-10-14 DIAGNOSIS — I10 Essential (primary) hypertension: Secondary | ICD-10-CM | POA: Diagnosis not present

## 2019-10-14 DIAGNOSIS — E119 Type 2 diabetes mellitus without complications: Secondary | ICD-10-CM | POA: Diagnosis not present

## 2019-10-14 DIAGNOSIS — E785 Hyperlipidemia, unspecified: Secondary | ICD-10-CM

## 2019-10-14 NOTE — Patient Instructions (Signed)
Results for orders placed or performed in visit on 09/06/19  Lipid Profile  Result Value Ref Range   Cholesterol, Total 216 (H) 100 - 199 mg/dL   Triglycerides 164 (H) 0 - 149 mg/dL   HDL 47 >39 mg/dL   VLDL Cholesterol Cal 29 5 - 40 mg/dL   LDL Chol Calc (NIH) 140 (H) 0 - 99 mg/dL   Chol/HDL Ratio 4.6 (H) 0.0 - 4.4 ratio  Basic Metabolic Panel (BMET)  Result Value Ref Range   Glucose 111 (H) 65 - 99 mg/dL   BUN 16 8 - 27 mg/dL   Creatinine, Ser 1.02 (H) 0.57 - 1.00 mg/dL   GFR calc non Af Amer 50 (L) >59 mL/min/1.73   GFR calc Af Amer 58 (L) >59 mL/min/1.73   BUN/Creatinine Ratio 16 12 - 28   Sodium 140 134 - 144 mmol/L   Potassium 4.6 3.5 - 5.2 mmol/L   Chloride 102 96 - 106 mmol/L   CO2 24 20 - 29 mmol/L   Calcium 10.1 8.7 - 10.3 mg/dL  Hemoglobin A1c  Result Value Ref Range   Hgb A1c MFr Bld 6.5 (H) 4.8 - 5.6 %   Est. average glucose Bld gHb Est-mCnc 140 mg/dL

## 2019-10-14 NOTE — Progress Notes (Addendum)
Subjective:    Patient ID: [name], female    DOB: 06-Nov-1931, 86 y.o.   MRN: [id number]  Hypertension This is a chronic problem. The current episode started more than 1 year ago. The problem is unchanged. The problem is controlled. Pertinent negatives include no anxiety, chest pain, palpitations or shortness of breath. There are no associated agents to hypertension. Risk factors for coronary artery disease include diabetes mellitus. There is no history of CVA.  Diabetes She presents for her initial diabetic visit. She has type 2 diabetes mellitus. Her disease course has been stable. Pertinent negatives for hypoglycemia include no confusion or dizziness. Pertinent negatives for diabetes include no chest pain, no fatigue, no polydipsia, no polyphagia and no weakness. Pertinent negatives for diabetic complications include no autonomic neuropathy, CVA or heart disease. Risk factors for coronary artery disease include diabetes mellitus. Current diabetic treatment includes oral agent (monotherapy). She is compliant with treatment all of the time. Her weight is stable. She is following a diabetic and generally healthy diet. She participates in exercise every other day.   Patient comes in today for follow up on hypertension and diabetes.  Patient would also like to discuss recent blood work. Results for orders placed or performed in visit on 09/06/19  Lipid Profile  Result Value Ref Range   Cholesterol, Total 216 (H) 100 - 199 mg/dL   Triglycerides 164 (H) 0 - 149 mg/dL   HDL 47 >39 mg/dL   VLDL Cholesterol Cal 29 5 - 40 mg/dL   LDL Chol Calc (NIH) 140 (H) 0 - 99 mg/dL   Chol/HDL Ratio 4.6 (H) 0.0 - 4.4 ratio  Basic Metabolic Panel (BMET)  Result Value Ref Range   Glucose 111 (H) 65 - 99 mg/dL   BUN 16 8 - 27 mg/dL   Creatinine, Ser 1.02 (H) 0.57 - 1.00 mg/dL   GFR calc non Af Amer 50 (L) >59 mL/min/1.73   GFR calc Af Amer 58 (L) >59 mL/min/1.73   BUN/Creatinine Ratio 16 12 - 28   Sodium 140 134 - 144 mmol/L   Potassium 4.6 3.5 - 5.2 mmol/L   Chloride 102 96 - 106 mmol/L   CO2 24 20 - 29 mmol/L   Calcium 10.1 8.7 - 10.3 mg/dL  Hemoglobin A1c  Result Value Ref Range   Hgb A1c MFr Bld 6.5 (H) 4.8 - 5.6 %   Est. average glucose Bld gHb Est-mCnc 140 mg/dL     Review of Systems  Constitutional: Negative for activity change, appetite change and fatigue.  HENT: Negative for congestion and rhinorrhea.   Respiratory: Negative for cough and shortness of breath.   Cardiovascular: Negative for chest pain, palpitations and leg swelling.  Gastrointestinal: Negative for abdominal pain and diarrhea.  Endocrine: Negative for polydipsia and polyphagia.  Skin: Negative for color change.  Neurological: Negative for dizziness and weakness.  Psychiatric/Behavioral: Negative for behavioral problems and confusion.       Objective:   Physical Exam Vitals reviewed.  Constitutional:      General: She is not in acute distress. HENT:     Head: Normocephalic and atraumatic.  Eyes:     General:        Right eye: No discharge.        Left eye: No discharge.  Neck:     Trachea: No tracheal deviation.  Cardiovascular:     Rate and Rhythm: Normal rate and regular rhythm.     Heart sounds: Normal heart sounds. No murmur.  Pulmonary:     Effort: Pulmonary effort is normal. No respiratory distress.     Breath sounds: Normal breath sounds.  Lymphadenopathy:     Cervical: No cervical adenopathy.  Skin:    General: Skin is warm and dry.  Neurological:     Mental Status: She is alert.     Coordination: Coordination normal.  Psychiatric:        Behavior: Behavior normal.   Diabetic foot exam patient does have some monofilament inability to feel, also bunions with some preulcerative areas and callus formation Fall Risk  10/14/2019 03/23/2019 05/07/2018 09/16/2017 10/02/2016  Falls in the past year? 0 0 0 No No  Number falls in past yr: - 0 - - -  Injury with Fall? - 0 - - -  Follow  up - Falls evaluation completed - - -   Results for orders placed or performed in visit on 09/06/19  Lipid Profile  Result Value Ref Range   Cholesterol, Total 216 (H) 100 - 199 mg/dL   Triglycerides 164 (H) 0 - 149 mg/dL   HDL 47 >39 mg/dL   VLDL Cholesterol Cal 29 5 - 40 mg/dL   LDL Chol Calc (NIH) 140 (H) 0 - 99 mg/dL   Chol/HDL Ratio 4.6 (H) 0.0 - 4.4 ratio  Basic Metabolic Panel (BMET)  Result Value Ref Range   Glucose 111 (H) 65 - 99 mg/dL   BUN 16 8 - 27 mg/dL   Creatinine, Ser 1.02 (H) 0.57 - 1.00 mg/dL   GFR calc non Af Amer 50 (L) >59 mL/min/1.73   GFR calc Af Amer 58 (L) >59 mL/min/1.73   BUN/Creatinine Ratio 16 12 - 28   Sodium 140 134 - 144 mmol/L   Potassium 4.6 3.5 - 5.2 mmol/L   Chloride 102 96 - 106 mmol/L   CO2 24 20 - 29 mmol/L   Calcium 10.1 8.7 - 10.3 mg/dL  Hemoglobin A1c  Result Value Ref Range   Hgb A1c MFr Bld 6.5 (H) 4.8 - 5.6 %   Est. average glucose Bld gHb Est-mCnc 140 mg/dL          Assessment & Plan:  1. Essential hypertension Blood pressure under good control continue current measures watch diet stay active  2. Controlled type 2 diabetes mellitus without complication, without long-term current use of insulin (HCC) A1c under good control continue current measures  3. Hyperlipidemia associated with type 2 diabetes mellitus ([place]) Mild hyperlipidemia patient has not tolerated statins even at a low dose multiple different times

## 2019-10-18 DIAGNOSIS — Z20828 Contact with and (suspected) exposure to other viral communicable diseases: Secondary | ICD-10-CM | POA: Diagnosis not present

## 2019-10-18 DIAGNOSIS — U071 COVID-19: Secondary | ICD-10-CM | POA: Diagnosis not present

## 2019-10-19 DIAGNOSIS — E1142 Type 2 diabetes mellitus with diabetic polyneuropathy: Secondary | ICD-10-CM | POA: Diagnosis not present

## 2019-10-19 DIAGNOSIS — B351 Tinea unguium: Secondary | ICD-10-CM | POA: Diagnosis not present

## 2019-10-19 DIAGNOSIS — L851 Acquired keratosis [keratoderma] palmaris et plantaris: Secondary | ICD-10-CM | POA: Diagnosis not present

## 2019-10-20 ENCOUNTER — Telehealth: Payer: Self-pay | Admitting: Family Medicine

## 2019-10-20 NOTE — Telephone Encounter (Signed)
Please advise. Thank you

## 2019-10-20 NOTE — Telephone Encounter (Signed)
Forms were filled out thank you

## 2019-10-20 NOTE — Telephone Encounter (Signed)
[facility] dropped off orders to be signed. Placed in providers office.  

## 2019-10-22 NOTE — Telephone Encounter (Signed)
upfront for pick up.

## 2019-10-29 ENCOUNTER — Other Ambulatory Visit: Payer: Self-pay | Admitting: Family Medicine

## 2019-11-16 ENCOUNTER — Encounter: Payer: Self-pay | Admitting: Family Medicine

## 2019-11-16 LAB — HM DIABETES EYE EXAM

## 2019-11-25 ENCOUNTER — Other Ambulatory Visit: Payer: Self-pay | Admitting: Family Medicine

## 2019-12-10 ENCOUNTER — Other Ambulatory Visit: Payer: Self-pay | Admitting: Family Medicine

## 2019-12-12 NOTE — Telephone Encounter (Signed)
Approve each x4

## 2019-12-30 ENCOUNTER — Telehealth: Payer: Self-pay | Admitting: Family Medicine

## 2019-12-30 NOTE — Telephone Encounter (Signed)
[facility] dropped off form to be signed.   CB#[phone] 

## 2019-12-31 NOTE — Telephone Encounter (Signed)
Forms picked up by [facility]  

## 2020-02-03 ENCOUNTER — Telehealth: Payer: Self-pay | Admitting: Family Medicine

## 2020-02-03 DIAGNOSIS — Z79899 Other long term (current) drug therapy: Secondary | ICD-10-CM

## 2020-02-03 DIAGNOSIS — I10 Essential (primary) hypertension: Secondary | ICD-10-CM

## 2020-02-03 DIAGNOSIS — E119 Type 2 diabetes mellitus without complications: Secondary | ICD-10-CM

## 2020-02-03 DIAGNOSIS — E785 Hyperlipidemia, unspecified: Secondary | ICD-10-CM

## 2020-02-03 NOTE — Telephone Encounter (Signed)
Last labs placed 10/01/19 A1C, BMET, LIPID. Please advise. Thank you

## 2020-02-03 NOTE — Telephone Encounter (Signed)
Pt would like to know if lab work needs to be done before her appt on 8/17

## 2020-02-04 NOTE — Telephone Encounter (Signed)
Lipid, liver, metabolic 7, A1c, urine ACR °

## 2020-02-04 NOTE — Telephone Encounter (Signed)
Blood work ordered in Epic. Patient notified. 

## 2020-02-08 DIAGNOSIS — E1169 Type 2 diabetes mellitus with other specified complication: Secondary | ICD-10-CM | POA: Diagnosis not present

## 2020-02-08 DIAGNOSIS — E119 Type 2 diabetes mellitus without complications: Secondary | ICD-10-CM | POA: Diagnosis not present

## 2020-02-08 DIAGNOSIS — E785 Hyperlipidemia, unspecified: Secondary | ICD-10-CM | POA: Diagnosis not present

## 2020-02-08 DIAGNOSIS — Z79899 Other long term (current) drug therapy: Secondary | ICD-10-CM | POA: Diagnosis not present

## 2020-02-08 DIAGNOSIS — I10 Essential (primary) hypertension: Secondary | ICD-10-CM | POA: Diagnosis not present

## 2020-02-09 ENCOUNTER — Other Ambulatory Visit: Payer: Self-pay | Admitting: Family Medicine

## 2020-02-09 LAB — HEPATIC FUNCTION PANEL
ALT: 13 IU/L (ref 0–32)
AST: 17 IU/L (ref 0–40)
Albumin: 4.6 g/dL (ref 3.6–4.6)
Alkaline Phosphatase: 109 IU/L (ref 48–121)
Bilirubin Total: 0.6 mg/dL (ref 0.0–1.2)
Bilirubin, Direct: 0.15 mg/dL (ref 0.00–0.40)
Total Protein: 6.8 g/dL (ref 6.0–8.5)

## 2020-02-09 LAB — HEMOGLOBIN A1C
Est. average glucose Bld gHb Est-mCnc: 146 mg/dL
Hgb A1c MFr Bld: 6.7 % — ABNORMAL HIGH (ref 4.8–5.6)

## 2020-02-09 LAB — BASIC METABOLIC PANEL
BUN/Creatinine Ratio: 17 (ref 12–28)
BUN: 15 mg/dL (ref 8–27)
CO2: 24 mmol/L (ref 20–29)
Calcium: 9.7 mg/dL (ref 8.7–10.3)
Chloride: 100 mmol/L (ref 96–106)
Creatinine, Ser: 0.9 mg/dL (ref 0.57–1.00)
GFR calc Af Amer: 67 mL/min/{1.73_m2} (ref >59)
GFR calc non Af Amer: 58 mL/min/{1.73_m2} — ABNORMAL LOW (ref >59)
Glucose: 122 mg/dL — ABNORMAL HIGH (ref 65–99)
Potassium: 4.6 mmol/L (ref 3.5–5.2)
Sodium: 138 mmol/L (ref 134–144)

## 2020-02-09 LAB — MICROALBUMIN / CREATININE URINE RATIO
Creatinine, Urine: 23.2 mg/dL
Microalb/Creat Ratio: 108 mg/g creat — ABNORMAL HIGH (ref 0–29)
Microalbumin, Urine: 25 ug/mL

## 2020-02-09 LAB — LIPID PANEL
Chol/HDL Ratio: 3.9 ratio (ref 0.0–4.4)
Cholesterol, Total: 201 mg/dL — ABNORMAL HIGH (ref 100–199)
HDL: 51 mg/dL (ref >39)
LDL Chol Calc (NIH): 124 mg/dL — ABNORMAL HIGH (ref 0–99)
Triglycerides: 146 mg/dL (ref 0–149)
VLDL Cholesterol Cal: 26 mg/dL (ref 5–40)

## 2020-02-15 ENCOUNTER — Ambulatory Visit (INDEPENDENT_AMBULATORY_CARE_PROVIDER_SITE_OTHER): Payer: Medicare Other | Admitting: Family Medicine

## 2020-02-15 ENCOUNTER — Telehealth: Payer: Self-pay | Admitting: Family Medicine

## 2020-02-15 ENCOUNTER — Encounter: Payer: Self-pay | Admitting: Family Medicine

## 2020-02-15 ENCOUNTER — Other Ambulatory Visit: Payer: Self-pay

## 2020-02-15 VITALS — BP 144/62 | Temp 96.8°F | Wt 171.6 lb

## 2020-02-15 DIAGNOSIS — T466X5A Adverse effect of antihyperlipidemic and antiarteriosclerotic drugs, initial encounter: Secondary | ICD-10-CM | POA: Insufficient documentation

## 2020-02-15 DIAGNOSIS — E785 Hyperlipidemia, unspecified: Secondary | ICD-10-CM | POA: Diagnosis not present

## 2020-02-15 DIAGNOSIS — E1169 Type 2 diabetes mellitus with other specified complication: Secondary | ICD-10-CM

## 2020-02-15 DIAGNOSIS — F411 Generalized anxiety disorder: Secondary | ICD-10-CM | POA: Diagnosis not present

## 2020-02-15 DIAGNOSIS — M791 Myalgia, unspecified site: Secondary | ICD-10-CM

## 2020-02-15 DIAGNOSIS — I10 Essential (primary) hypertension: Secondary | ICD-10-CM | POA: Diagnosis not present

## 2020-02-15 MED ORDER — AMLODIPINE BESYLATE 10 MG PO TABS: 10.0000 mg | ORAL_TABLET | Freq: Every day | ORAL | 5 refills | Status: DC

## 2020-02-15 MED ORDER — METFORMIN HCL 500 MG PO TABS: ORAL_TABLET | ORAL | 6 refills | Status: DC

## 2020-02-15 MED ORDER — HYDROCHLOROTHIAZIDE 25 MG PO TABS: 25.0000 mg | ORAL_TABLET | Freq: Every day | ORAL | 5 refills | Status: DC

## 2020-02-15 MED ORDER — HYDRALAZINE HCL 25 MG PO TABS: ORAL_TABLET | ORAL | 5 refills | Status: DC

## 2020-02-15 MED ORDER — METFORMIN HCL 1000 MG PO TABS: ORAL_TABLET | ORAL | 5 refills | Status: DC

## 2020-02-15 MED ORDER — PANTOPRAZOLE SODIUM 20 MG PO TBEC: DELAYED_RELEASE_TABLET | ORAL | 5 refills | Status: DC

## 2020-02-15 MED ORDER — LOSARTAN POTASSIUM 100 MG PO TABS: 100.0000 mg | ORAL_TABLET | Freq: Every day | ORAL | 5 refills | Status: DC

## 2020-02-15 MED ORDER — ALPRAZOLAM 0.25 MG PO TABS: ORAL_TABLET | ORAL | 5 refills | Status: DC

## 2020-02-15 NOTE — Telephone Encounter (Signed)
Correct dose Metformin 500 one each morning And 1/2 at evening Please notify [facility] I will send renewed rx

## 2020-02-15 NOTE — Patient Instructions (Addendum)
Diabetes Mellitus and Nutrition, Adult When you have diabetes (diabetes mellitus), it is very important to have healthy eating habits because your blood sugar (glucose) levels are greatly affected by what you eat and drink. Eating healthy foods in the appropriate amounts, at about the same times every day, can help you:  Control your blood glucose.  Lower your risk of heart disease.  Improve your blood pressure.  Reach or maintain a healthy weight. Every person with diabetes is different, and each person has different needs for a meal plan. Your health care provider may recommend that you work with a diet and nutrition specialist (dietitian) to make a meal plan that is best for you. Your meal plan may vary depending on factors such as:  The calories you need.  The medicines you take.  Your weight.  Your blood glucose, blood pressure, and cholesterol levels.  Your activity level.  Other health conditions you have, such as heart or kidney disease. How do carbohydrates affect me? Carbohydrates, also called carbs, affect your blood glucose level more than any other type of food. Eating carbs naturally raises the amount of glucose in your blood. Carb counting is a method for keeping track of how many carbs you eat. Counting carbs is important to keep your blood glucose at a healthy level, especially if you use insulin or take certain oral diabetes medicines. It is important to know how many carbs you can safely have in each meal. This is different for every person. Your dietitian can help you calculate how many carbs you should have at each meal and for each snack. Foods that contain carbs include:  Bread, cereal, rice, pasta, and crackers.  Potatoes and corn.  Peas, beans, and lentils.  Milk and yogurt.  Fruit and juice.  Desserts, such as cakes, cookies, ice cream, and candy. How does alcohol affect me? Alcohol can cause a sudden decrease in blood glucose (hypoglycemia),  especially if you use insulin or take certain oral diabetes medicines. Hypoglycemia can be a life-threatening condition. Symptoms of hypoglycemia (sleepiness, dizziness, and confusion) are similar to symptoms of having too much alcohol. If your health care provider says that alcohol is safe for you, follow these guidelines:  Limit alcohol intake to no more than 1 drink per day for nonpregnant women and 2 drinks per day for men. One drink equals 12 oz of beer, 5 oz of wine, or 1 oz of hard liquor.  Do not drink on an empty stomach.  Keep yourself hydrated with water, diet soda, or unsweetened iced tea.  Keep in mind that regular soda, juice, and other mixers may contain a lot of sugar and must be counted as carbs. What are tips for following this plan?  Reading food labels  Start by checking the serving size on the "Nutrition Facts" label of packaged foods and drinks. The amount of calories, carbs, fats, and other nutrients listed on the label is based on one serving of the item. Many items contain more than one serving per package.  Check the total grams (g) of carbs in one serving. You can calculate the number of servings of carbs in one serving by dividing the total carbs by 15. For example, if a food has 30 g of total carbs, it would be equal to 2 servings of carbs.  Check the number of grams (g) of saturated and trans fats in one serving. Choose foods that have low or no amount of these fats.  Check the number of   milligrams (mg) of salt (sodium) in one serving. Most people should limit total sodium intake to less than 2,300 mg per day.  Always check the nutrition information of foods labeled as "low-fat" or "nonfat". These foods may be higher in added sugar or refined carbs and should be avoided.  Talk to your dietitian to identify your daily goals for nutrients listed on the label. Shopping  Avoid buying canned, premade, or processed foods. These foods tend to be high in fat, sodium,  and added sugar.  Shop around the outside edge of the grocery store. This includes fresh fruits and vegetables, bulk grains, fresh meats, and fresh dairy. Cooking  Use low-heat cooking methods, such as baking, instead of high-heat cooking methods like deep frying.  Cook using healthy oils, such as olive, canola, or sunflower oil.  Avoid cooking with butter, cream, or high-fat meats. Meal planning  Eat meals and snacks regularly, preferably at the same times every day. Avoid going long periods of time without eating.  Eat foods high in fiber, such as fresh fruits, vegetables, beans, and whole grains. Talk to your dietitian about how many servings of carbs you can eat at each meal.  Eat 4-6 ounces (oz) of lean protein each day, such as lean meat, chicken, fish, eggs, or tofu. One oz of lean protein is equal to: ? 1 oz of meat, chicken, or fish. ? 1 egg. ?  cup of tofu.  Eat some foods each day that contain healthy fats, such as avocado, nuts, seeds, and fish. Lifestyle  Check your blood glucose regularly.  Exercise regularly as told by your health care provider. This may include: ? 150 minutes of moderate-intensity or vigorous-intensity exercise each week. This could be brisk walking, biking, or water aerobics. ? Stretching and doing strength exercises, such as yoga or weightlifting, at least 2 times a week.  Take medicines as told by your health care provider.  Do not use any products that contain nicotine or tobacco, such as cigarettes and e-cigarettes. If you need help quitting, ask your health care provider.  Work with a Social worker or diabetes educator to identify strategies to manage stress and any emotional and social challenges. Questions to ask a health care provider  Do I need to meet with a diabetes educator?  Do I need to meet with a dietitian?  What number can I call if I have questions?  When are the best times to check my blood glucose? Where to find more  information:  American Diabetes Association: diabetes.org  Academy of Nutrition and Dietetics: www.eatright.[employer] of Diabetes and Digestive and Kidney Diseases (NIH): [employer] Summary  A healthy meal plan will help you control your blood glucose and maintain a healthy lifestyle.  Working with a diet and nutrition specialist (dietitian) can help you make a meal plan that is best for you.  Keep in mind that carbohydrates (carbs) and alcohol have immediate effects on your blood glucose levels. It is important to count carbs and to use alcohol carefully. This information is not intended to replace advice given to you by your health care provider. Make sure you discuss any questions you have with your health care provider. Document Revised: 05/30/2017 Document Reviewed: 07/22/2016 Elsevier Patient Education  2020 [employer].  Results for orders placed or performed in visit on 02/03/20  Lipid panel  Result Value Ref Range   Cholesterol, Total 201 (H) 100 - 199 mg/dL   Triglycerides 146 0 - 149 mg/dL  HDL 51 >39 mg/dL   VLDL Cholesterol Cal 26 5 - 40 mg/dL   LDL Chol Calc (NIH) 124 (H) 0 - 99 mg/dL   Chol/HDL Ratio 3.9 0.0 - 4.4 ratio  Hepatic function panel  Result Value Ref Range   Total Protein 6.8 6.0 - 8.5 g/dL   Albumin 4.6 3.6 - 4.6 g/dL   Bilirubin Total 0.6 0.0 - 1.2 mg/dL   Bilirubin, Direct 0.15 0.00 - 0.40 mg/dL   Alkaline Phosphatase 109 48 - 121 IU/L   AST 17 0 - 40 IU/L   ALT 13 0 - 32 IU/L  Basic metabolic panel  Result Value Ref Range   Glucose 122 (H) 65 - 99 mg/dL   BUN 15 8 - 27 mg/dL   Creatinine, Ser 0.90 0.57 - 1.00 mg/dL   GFR calc non Af Amer 58 (L) >59 mL/min/1.73   GFR calc Af Amer 67 >59 mL/min/1.73   BUN/Creatinine Ratio 17 12 - 28   Sodium 138 134 - 144 mmol/L   Potassium 4.6 3.5 - 5.2 mmol/L   Chloride 100 96 - 106 mmol/L   CO2 24 20 - 29 mmol/L   Calcium 9.7 8.7 - 10.3 mg/dL  Hemoglobin A1c  Result Value Ref  Range   Hgb A1c MFr Bld 6.7 (H) 4.8 - 5.6 %   Est. average glucose Bld gHb Est-mCnc 146 mg/dL  Microalbumin / creatinine urine ratio  Result Value Ref Range   Creatinine, Urine 23.2 Not Estab. mg/dL   Microalbumin, Urine 25.0 Not Estab. ug/mL   Microalb/Creat Ratio 108 (H) 0 - 29 mg/g creat

## 2020-02-15 NOTE — Telephone Encounter (Signed)
Metformin 500 mg one tablet in the morning per [facility]

## 2020-02-15 NOTE — Telephone Encounter (Signed)
done

## 2020-02-15 NOTE — Addendum Note (Signed)
Addended by: Sallee Lange A on: 02/15/2020 02:36 PM   Modules accepted: Orders

## 2020-02-15 NOTE — Telephone Encounter (Signed)
Nurses Please call [facility] Please find out what was the dose of her Metformin before today's visit? I am confused by her medicine readout on our end  Please find out was at 500 mg in the morning?  Was she taking a 500 mg tablet?

## 2020-02-15 NOTE — Progress Notes (Signed)
Subjective:    Patient ID: [name], female    DOB: April 18, 1932, 86 y.o.   MRN: [id number]  HPI Pt here for follow up. Pt states she gets up in the morning and walks. Pt was on 500 metformin BID, was cut back to 500 mg once a day in the morning. Sugars running from 130-151.   Essential hypertension - Plan: Basic Metabolic Panel (BMET)  Hyperlipidemia associated with type 2 diabetes mellitus ([place]) - Plan: Basic Metabolic Panel (BMET)  Myalgia due to statin - Plan: Basic Metabolic Panel (BMET)  Generalized anxiety disorder   Pt is checked periodically for COVID since she is in a long term facility.   Review of Systems  Constitutional: Negative for activity change, appetite change and fatigue.  HENT: Negative for congestion and rhinorrhea.   Respiratory: Negative for cough and shortness of breath.   Cardiovascular: Negative for chest pain and leg swelling.  Gastrointestinal: Negative for abdominal pain and diarrhea.  Endocrine: Negative for polydipsia and polyphagia.  Skin: Negative for color change.  Neurological: Negative for dizziness and weakness.  Psychiatric/Behavioral: Negative for behavioral problems and confusion.       Objective:   Physical Exam Vitals reviewed.  Constitutional:      General: She is not in acute distress. HENT:     Head: Normocephalic and atraumatic.  Eyes:     General:        Right eye: No discharge.        Left eye: No discharge.  Neck:     Trachea: No tracheal deviation.  Cardiovascular:     Rate and Rhythm: Normal rate and regular rhythm.     Heart sounds: Normal heart sounds. No murmur heard.   Pulmonary:     Effort: Pulmonary effort is normal. No respiratory distress.     Breath sounds: Normal breath sounds.  Lymphadenopathy:     Cervical: No cervical adenopathy.  Skin:    General: Skin is warm and dry.  Neurological:     Mental Status: She is alert.     Coordination: Coordination normal.  Psychiatric:        Behavior:  Behavior normal.    Results for orders placed or performed in visit on 02/03/20  Lipid panel  Result Value Ref Range   Cholesterol, Total 201 (H) 100 - 199 mg/dL   Triglycerides 146 0 - 149 mg/dL   HDL 51 >39 mg/dL   VLDL Cholesterol Cal 26 5 - 40 mg/dL   LDL Chol Calc (NIH) 124 (H) 0 - 99 mg/dL   Chol/HDL Ratio 3.9 0.0 - 4.4 ratio  Hepatic function panel  Result Value Ref Range   Total Protein 6.8 6.0 - 8.5 g/dL   Albumin 4.6 3.6 - 4.6 g/dL   Bilirubin Total 0.6 0.0 - 1.2 mg/dL   Bilirubin, Direct 0.15 0.00 - 0.40 mg/dL   Alkaline Phosphatase 109 48 - 121 IU/L   AST 17 0 - 40 IU/L   ALT 13 0 - 32 IU/L  Basic metabolic panel  Result Value Ref Range   Glucose 122 (H) 65 - 99 mg/dL   BUN 15 8 - 27 mg/dL   Creatinine, Ser 0.90 0.57 - 1.00 mg/dL   GFR calc non Af Amer 58 (L) >59 mL/min/1.73   GFR calc Af Amer 67 >59 mL/min/1.73   BUN/Creatinine Ratio 17 12 - 28   Sodium 138 134 - 144 mmol/L   Potassium 4.6 3.5 - 5.2 mmol/L   Chloride 100  96 - 106 mmol/L   CO2 24 20 - 29 mmol/L   Calcium 9.7 8.7 - 10.3 mg/dL  Hemoglobin A1c  Result Value Ref Range   Hgb A1c MFr Bld 6.7 (H) 4.8 - 5.6 %   Est. average glucose Bld gHb Est-mCnc 146 mg/dL  Microalbumin / creatinine urine ratio  Result Value Ref Range   Creatinine, Urine 23.2 Not Estab. mg/dL   Microalbumin, Urine 25.0 Not Estab. ug/mL   Microalb/Creat Ratio 108 (H) 0 - 29 mg/g creat          Assessment & Plan:  HTN- patient seen for follow-up regarding HTN.  Diet, medication compliance, appropriate labs and refills were completed.  Importance of keeping blood pressure under good control to lessen the risk of complications discussed Systolic blood pressure slightly elevated but with your bottom number being so low I am concerned about orthostatic hypotension keep all as it is  The patient was seen today as part of a comprehensive visit for diabetes. The importance of keeping her A1c at or below 7 was discussed.  Diabetes not  under good control plus also increased protein in urine this was discussed with the patient Discussed diet, activity, and medication compliance Standard follow-up visit recommended.  Patient aware lack of control and follow-up increases risk of diabetic complications. A1c is gone up patient is concerned we will bump up the dose of the Metformin 500 mg now 250 mg at supper patient will let us know how her readings are doing she will repeat the metabolic 7 in 1 month and we will follow up A1c in 4 months  Anxiety related issues overall doing well uses Xanax intermittently denies any problem with it   Patient does not tolerate statins healthy diet recommended  30 minutes was spent with the patient including previsit chart review, time spent with patient, discussion of health issues, review of data including medical record, and documentation of the visit.

## 2020-02-18 DIAGNOSIS — Z20828 Contact with and (suspected) exposure to other viral communicable diseases: Secondary | ICD-10-CM | POA: Diagnosis not present

## 2020-02-18 DIAGNOSIS — U071 COVID-19: Secondary | ICD-10-CM | POA: Diagnosis not present

## 2020-02-22 ENCOUNTER — Telehealth: Payer: Self-pay | Admitting: Family Medicine

## 2020-02-22 DIAGNOSIS — Z20828 Contact with and (suspected) exposure to other viral communicable diseases: Secondary | ICD-10-CM | POA: Diagnosis not present

## 2020-02-22 DIAGNOSIS — U071 COVID-19: Secondary | ICD-10-CM | POA: Diagnosis not present

## 2020-02-22 NOTE — Telephone Encounter (Signed)
[facility] will call back in the am and scheduled patient an appt if she is still not feeling like herself.

## 2020-02-22 NOTE — Telephone Encounter (Signed)
If still feeling bad in the morning call us and I can work her in

## 2020-02-22 NOTE — Telephone Encounter (Signed)
[name] at [facility] calling to say that patient says "she doesn't feel good". Not her usual self.  No real complaints other than feeling tired.  BP this morning and at lunch was 168/65 and 134/59.  [facility] wasn't sure if patient needed to be seen or wait to see if anything else develops.  Patient was up and walking around at time of phone call.   [name] [phone]

## 2020-02-23 DIAGNOSIS — U071 COVID-19: Secondary | ICD-10-CM | POA: Diagnosis not present

## 2020-02-23 DIAGNOSIS — Z20828 Contact with and (suspected) exposure to other viral communicable diseases: Secondary | ICD-10-CM | POA: Diagnosis not present

## 2020-02-24 DIAGNOSIS — U071 COVID-19: Secondary | ICD-10-CM | POA: Diagnosis not present

## 2020-02-24 DIAGNOSIS — Z20828 Contact with and (suspected) exposure to other viral communicable diseases: Secondary | ICD-10-CM | POA: Diagnosis not present

## 2020-02-29 ENCOUNTER — Ambulatory Visit (INDEPENDENT_AMBULATORY_CARE_PROVIDER_SITE_OTHER): Payer: Medicare Other | Admitting: Family Medicine

## 2020-02-29 ENCOUNTER — Other Ambulatory Visit: Payer: Self-pay

## 2020-02-29 VITALS — BP 142/78 | HR 85 | Temp 97.0°F | Wt 166.2 lb

## 2020-02-29 DIAGNOSIS — E785 Hyperlipidemia, unspecified: Secondary | ICD-10-CM | POA: Diagnosis not present

## 2020-02-29 DIAGNOSIS — M791 Myalgia, unspecified site: Secondary | ICD-10-CM | POA: Diagnosis not present

## 2020-02-29 DIAGNOSIS — I951 Orthostatic hypotension: Secondary | ICD-10-CM | POA: Diagnosis not present

## 2020-02-29 DIAGNOSIS — T466X5A Adverse effect of antihyperlipidemic and antiarteriosclerotic drugs, initial encounter: Secondary | ICD-10-CM | POA: Diagnosis not present

## 2020-02-29 DIAGNOSIS — E1169 Type 2 diabetes mellitus with other specified complication: Secondary | ICD-10-CM | POA: Diagnosis not present

## 2020-02-29 DIAGNOSIS — U071 COVID-19: Secondary | ICD-10-CM | POA: Diagnosis not present

## 2020-02-29 DIAGNOSIS — I10 Essential (primary) hypertension: Secondary | ICD-10-CM | POA: Diagnosis not present

## 2020-02-29 DIAGNOSIS — Z20828 Contact with and (suspected) exposure to other viral communicable diseases: Secondary | ICD-10-CM | POA: Diagnosis not present

## 2020-02-29 MED ORDER — LOSARTAN POTASSIUM 50 MG PO TABS: 50.0000 mg | ORAL_TABLET | Freq: Every day | ORAL | 5 refills | Status: DC

## 2020-02-29 NOTE — Progress Notes (Signed)
   Subjective:    Patient ID: [name], female    DOB: [birth year], 86 y.o.   MRN: [id number]  HPI  Patient arrives just no felling good for a week- feels weak and swimmy headed.  Patient finds herself feeling rundown she denies high fever chills sweats she denies flank pain abdominal pain nausea vomiting diarrhea dysuria.  She states her appetite is fair moods are doing okay energy level is subpar she feels weak and feels swimmy when she stands up  Review of Systems Please see above    Objective:   Physical Exam  Lungs are clear heart regular pulse normal extremities no edema skin warm dry blood pressure sitting 128/74 standing 110/48      Assessment & Plan:  Orthostasis I believe that this is related to her blood pressure medicines reduce losartan from 100 mg down to 50 mg follow-up patient in 7 to 10 days for recheck check metabolic 7 await the results

## 2020-03-01 ENCOUNTER — Other Ambulatory Visit: Payer: Self-pay | Admitting: *Deleted

## 2020-03-01 ENCOUNTER — Telehealth: Payer: Self-pay | Admitting: Family Medicine

## 2020-03-01 ENCOUNTER — Other Ambulatory Visit: Payer: Self-pay | Admitting: Family Medicine

## 2020-03-01 DIAGNOSIS — E871 Hypo-osmolality and hyponatremia: Secondary | ICD-10-CM

## 2020-03-01 LAB — BASIC METABOLIC PANEL
BUN/Creatinine Ratio: 18 (ref 12–28)
BUN: 17 mg/dL (ref 8–27)
CO2: 24 mmol/L (ref 20–29)
Calcium: 9.7 mg/dL (ref 8.7–10.3)
Chloride: 81 mmol/L — ABNORMAL LOW (ref 96–106)
Creatinine, Ser: 0.95 mg/dL (ref 0.57–1.00)
GFR calc Af Amer: 63 mL/min/{1.73_m2} (ref >59)
GFR calc non Af Amer: 54 mL/min/{1.73_m2} — ABNORMAL LOW (ref >59)
Glucose: 176 mg/dL — ABNORMAL HIGH (ref 65–99)
Potassium: 3.7 mmol/L (ref 3.5–5.2)
Sodium: 122 mmol/L — ABNORMAL LOW (ref 134–144)

## 2020-03-01 NOTE — Telephone Encounter (Signed)
Serum sodium very low See result note Thank you

## 2020-03-01 NOTE — Telephone Encounter (Signed)
Discussed with pt and gail at [facility]. See result note.

## 2020-03-02 DIAGNOSIS — Z20828 Contact with and (suspected) exposure to other viral communicable diseases: Secondary | ICD-10-CM | POA: Diagnosis not present

## 2020-03-02 DIAGNOSIS — U071 COVID-19: Secondary | ICD-10-CM | POA: Diagnosis not present

## 2020-03-02 DIAGNOSIS — E871 Hypo-osmolality and hyponatremia: Secondary | ICD-10-CM | POA: Diagnosis not present

## 2020-03-03 LAB — BASIC METABOLIC PANEL
BUN/Creatinine Ratio: 17 (ref 12–28)
BUN: 15 mg/dL (ref 8–27)
CO2: 26 mmol/L (ref 20–29)
Calcium: 9.9 mg/dL (ref 8.7–10.3)
Chloride: 86 mmol/L — ABNORMAL LOW (ref 96–106)
Creatinine, Ser: 0.87 mg/dL (ref 0.57–1.00)
GFR calc Af Amer: 70 mL/min/{1.73_m2} (ref >59)
GFR calc non Af Amer: 61 mL/min/{1.73_m2} (ref >59)
Glucose: 136 mg/dL — ABNORMAL HIGH (ref 65–99)
Potassium: 4.3 mmol/L (ref 3.5–5.2)
Sodium: 127 mmol/L — ABNORMAL LOW (ref 134–144)

## 2020-03-03 LAB — SODIUM, URINE, RANDOM: Sodium, Ur: 40 mmol/L

## 2020-03-03 LAB — OSMOLALITY, URINE: Osmolality, Ur: 205 mOsmol/kg

## 2020-03-03 LAB — CORTISOL: Cortisol: 11.1 ug/dL

## 2020-03-07 ENCOUNTER — Ambulatory Visit (INDEPENDENT_AMBULATORY_CARE_PROVIDER_SITE_OTHER): Payer: Medicare Other | Admitting: Family Medicine

## 2020-03-07 ENCOUNTER — Other Ambulatory Visit: Payer: Self-pay

## 2020-03-07 VITALS — BP 128/78 | Temp 97.9°F | Wt 168.4 lb

## 2020-03-07 DIAGNOSIS — I951 Orthostatic hypotension: Secondary | ICD-10-CM | POA: Diagnosis not present

## 2020-03-07 DIAGNOSIS — Z20828 Contact with and (suspected) exposure to other viral communicable diseases: Secondary | ICD-10-CM | POA: Diagnosis not present

## 2020-03-07 DIAGNOSIS — U071 COVID-19: Secondary | ICD-10-CM | POA: Diagnosis not present

## 2020-03-07 DIAGNOSIS — E871 Hypo-osmolality and hyponatremia: Secondary | ICD-10-CM

## 2020-03-07 NOTE — Progress Notes (Signed)
   Subjective:    Patient ID: [name], female    DOB: October 12, 1931, 86 y.o.   MRN: [id number]  HPI  Patient arrives for a follow up on dizziness Very nice patient Recent low sodium She drinks 8 glasses of water a day In addition to this was on all of her medications but we reduced her losartan down to 50 mg Patient currently feels like things are starting to go better She denies any major setbacks. Review of Systems Please see above    Objective:   Physical Exam Lungs clear heart regular pulse normal BP sitting and standing were reasonable       Assessment & Plan:  Patient relates that her dizziness is doing better.  She is able to get up and walk around without having as much lightheadedness.  Denies any chest pain or shortness of breath   She will do a follow-up visit in several weeks and doing lab work before that visit  It is important for this patient to cut back on water drinking I would only recommend no more than 3 glasses of water a day

## 2020-03-08 DIAGNOSIS — Z20828 Contact with and (suspected) exposure to other viral communicable diseases: Secondary | ICD-10-CM | POA: Diagnosis not present

## 2020-03-08 DIAGNOSIS — U071 COVID-19: Secondary | ICD-10-CM | POA: Diagnosis not present

## 2020-03-08 NOTE — Addendum Note (Signed)
Addended by: Vicente Males on: 03/08/2020 11:14 AM   Modules accepted: Orders

## 2020-03-08 NOTE — Progress Notes (Signed)
Lab order placed. Will fax lab order to [facility].

## 2020-03-09 DIAGNOSIS — Z20828 Contact with and (suspected) exposure to other viral communicable diseases: Secondary | ICD-10-CM | POA: Diagnosis not present

## 2020-03-09 DIAGNOSIS — U071 COVID-19: Secondary | ICD-10-CM | POA: Diagnosis not present

## 2020-03-13 DIAGNOSIS — Z20828 Contact with and (suspected) exposure to other viral communicable diseases: Secondary | ICD-10-CM | POA: Diagnosis not present

## 2020-03-13 DIAGNOSIS — U071 COVID-19: Secondary | ICD-10-CM | POA: Diagnosis not present

## 2020-03-14 ENCOUNTER — Other Ambulatory Visit: Payer: Self-pay | Admitting: Family Medicine

## 2020-03-14 NOTE — Telephone Encounter (Signed)
Last seen 03/07/20

## 2020-03-16 DIAGNOSIS — U071 COVID-19: Secondary | ICD-10-CM | POA: Diagnosis not present

## 2020-03-16 DIAGNOSIS — Z20828 Contact with and (suspected) exposure to other viral communicable diseases: Secondary | ICD-10-CM | POA: Diagnosis not present

## 2020-03-21 DIAGNOSIS — E871 Hypo-osmolality and hyponatremia: Secondary | ICD-10-CM | POA: Diagnosis not present

## 2020-03-21 DIAGNOSIS — Z20828 Contact with and (suspected) exposure to other viral communicable diseases: Secondary | ICD-10-CM | POA: Diagnosis not present

## 2020-03-21 DIAGNOSIS — U071 COVID-19: Secondary | ICD-10-CM | POA: Diagnosis not present

## 2020-03-22 ENCOUNTER — Other Ambulatory Visit: Payer: Self-pay | Admitting: Family Medicine

## 2020-03-22 LAB — BASIC METABOLIC PANEL
BUN/Creatinine Ratio: 15 (ref 12–28)
BUN: 16 mg/dL (ref 8–27)
CO2: 26 mmol/L (ref 20–29)
Calcium: 10.5 mg/dL — ABNORMAL HIGH (ref 8.7–10.3)
Chloride: 95 mmol/L — ABNORMAL LOW (ref 96–106)
Creatinine, Ser: 1.08 mg/dL — ABNORMAL HIGH (ref 0.57–1.00)
GFR calc Af Amer: 54 mL/min/{1.73_m2} — ABNORMAL LOW (ref >59)
GFR calc non Af Amer: 47 mL/min/{1.73_m2} — ABNORMAL LOW (ref >59)
Glucose: 138 mg/dL — ABNORMAL HIGH (ref 65–99)
Potassium: 4.5 mmol/L (ref 3.5–5.2)
Sodium: 135 mmol/L (ref 134–144)

## 2020-03-23 DIAGNOSIS — U071 COVID-19: Secondary | ICD-10-CM | POA: Diagnosis not present

## 2020-03-23 DIAGNOSIS — Z20828 Contact with and (suspected) exposure to other viral communicable diseases: Secondary | ICD-10-CM | POA: Diagnosis not present

## 2020-03-24 DIAGNOSIS — L851 Acquired keratosis [keratoderma] palmaris et plantaris: Secondary | ICD-10-CM | POA: Diagnosis not present

## 2020-03-24 DIAGNOSIS — B351 Tinea unguium: Secondary | ICD-10-CM | POA: Diagnosis not present

## 2020-03-24 DIAGNOSIS — E1142 Type 2 diabetes mellitus with diabetic polyneuropathy: Secondary | ICD-10-CM | POA: Diagnosis not present

## 2020-03-28 DIAGNOSIS — U071 COVID-19: Secondary | ICD-10-CM | POA: Diagnosis not present

## 2020-03-28 DIAGNOSIS — Z20828 Contact with and (suspected) exposure to other viral communicable diseases: Secondary | ICD-10-CM | POA: Diagnosis not present

## 2020-03-30 DIAGNOSIS — Z20828 Contact with and (suspected) exposure to other viral communicable diseases: Secondary | ICD-10-CM | POA: Diagnosis not present

## 2020-03-30 DIAGNOSIS — U071 COVID-19: Secondary | ICD-10-CM | POA: Diagnosis not present

## 2020-04-02 ENCOUNTER — Inpatient Hospital Stay (HOSPITAL_COMMUNITY)
Admission: EM | Admit: 2020-04-02 | Discharge: 2020-04-06 | DRG: 389 | Disposition: A | Discharge status: Home Health | Payer: Medicare Other | Source: Skilled Nursing Facility | Attending: Family Medicine | Admitting: Family Medicine

## 2020-04-02 ENCOUNTER — Encounter (HOSPITAL_COMMUNITY): Payer: Self-pay

## 2020-04-02 ENCOUNTER — Other Ambulatory Visit: Payer: Self-pay

## 2020-04-02 ENCOUNTER — Emergency Department (HOSPITAL_COMMUNITY): Payer: Medicare Other

## 2020-04-02 DIAGNOSIS — K5900 Constipation, unspecified: Secondary | ICD-10-CM | POA: Diagnosis not present

## 2020-04-02 DIAGNOSIS — R5381 Other malaise: Secondary | ICD-10-CM | POA: Diagnosis not present

## 2020-04-02 DIAGNOSIS — E785 Hyperlipidemia, unspecified: Secondary | ICD-10-CM | POA: Diagnosis present

## 2020-04-02 DIAGNOSIS — K5651 Intestinal adhesions [bands], with partial obstruction: Principal | ICD-10-CM | POA: Diagnosis present

## 2020-04-02 DIAGNOSIS — E1169 Type 2 diabetes mellitus with other specified complication: Secondary | ICD-10-CM | POA: Diagnosis present

## 2020-04-02 DIAGNOSIS — R112 Nausea with vomiting, unspecified: Secondary | ICD-10-CM | POA: Diagnosis not present

## 2020-04-02 DIAGNOSIS — R140 Abdominal distension (gaseous): Secondary | ICD-10-CM | POA: Diagnosis not present

## 2020-04-02 DIAGNOSIS — Z79899 Other long term (current) drug therapy: Secondary | ICD-10-CM | POA: Diagnosis not present

## 2020-04-02 DIAGNOSIS — Z23 Encounter for immunization: Secondary | ICD-10-CM | POA: Diagnosis not present

## 2020-04-02 DIAGNOSIS — K56609 Unspecified intestinal obstruction, unspecified as to partial versus complete obstruction: Secondary | ICD-10-CM | POA: Diagnosis present

## 2020-04-02 DIAGNOSIS — Z20822 Contact with and (suspected) exposure to covid-19: Secondary | ICD-10-CM | POA: Diagnosis present

## 2020-04-02 DIAGNOSIS — Z9049 Acquired absence of other specified parts of digestive tract: Secondary | ICD-10-CM

## 2020-04-02 DIAGNOSIS — B964 Proteus (mirabilis) (morganii) as the cause of diseases classified elsewhere: Secondary | ICD-10-CM | POA: Diagnosis present

## 2020-04-02 DIAGNOSIS — Z7984 Long term (current) use of oral hypoglycemic drugs: Secondary | ICD-10-CM

## 2020-04-02 DIAGNOSIS — N390 Urinary tract infection, site not specified: Secondary | ICD-10-CM | POA: Diagnosis present

## 2020-04-02 DIAGNOSIS — Z90710 Acquired absence of both cervix and uterus: Secondary | ICD-10-CM | POA: Diagnosis not present

## 2020-04-02 DIAGNOSIS — I10 Essential (primary) hypertension: Secondary | ICD-10-CM | POA: Diagnosis present

## 2020-04-02 DIAGNOSIS — R110 Nausea: Secondary | ICD-10-CM | POA: Diagnosis not present

## 2020-04-02 DIAGNOSIS — F411 Generalized anxiety disorder: Secondary | ICD-10-CM | POA: Diagnosis present

## 2020-04-02 DIAGNOSIS — N3000 Acute cystitis without hematuria: Secondary | ICD-10-CM

## 2020-04-02 LAB — BASIC METABOLIC PANEL
Anion gap: 12 (ref 5–15)
BUN: 26 mg/dL — ABNORMAL HIGH (ref 8–23)
CO2: 25 mmol/L (ref 22–32)
Calcium: 9.6 mg/dL (ref 8.9–10.3)
Chloride: 95 mmol/L — ABNORMAL LOW (ref 98–111)
Creatinine, Ser: 1.13 mg/dL — ABNORMAL HIGH (ref 0.44–1.00)
GFR calc Af Amer: 51 mL/min — ABNORMAL LOW (ref >60)
GFR calc non Af Amer: 44 mL/min — ABNORMAL LOW (ref >60)
Glucose, Bld: 170 mg/dL — ABNORMAL HIGH (ref 70–99)
Potassium: 3.7 mmol/L (ref 3.5–5.1)
Sodium: 132 mmol/L — ABNORMAL LOW (ref 135–145)

## 2020-04-02 LAB — HEPATIC FUNCTION PANEL
ALT: 20 U/L (ref 0–44)
AST: 27 U/L (ref 15–41)
Albumin: 4.3 g/dL (ref 3.5–5.0)
Alkaline Phosphatase: 91 U/L (ref 38–126)
Bilirubin, Direct: 0.1 mg/dL (ref 0.0–0.2)
Indirect Bilirubin: 0.8 mg/dL (ref 0.3–0.9)
Total Bilirubin: 0.9 mg/dL (ref 0.3–1.2)
Total Protein: 7.5 g/dL (ref 6.5–8.1)

## 2020-04-02 LAB — URINALYSIS, ROUTINE W REFLEX MICROSCOPIC
Bilirubin Urine: NEGATIVE
Glucose, UA: NEGATIVE mg/dL
Hgb urine dipstick: NEGATIVE
Ketones, ur: NEGATIVE mg/dL
Nitrite: NEGATIVE
Protein, ur: 30 mg/dL — AB
Specific Gravity, Urine: 1.013 (ref 1.005–1.030)
WBC, UA: >50 WBC/hpf — ABNORMAL HIGH (ref 0–5)
pH: 8 (ref 5.0–8.0)

## 2020-04-02 LAB — CBC
HCT: 39.2 % (ref 36.0–46.0)
Hemoglobin: 13.1 g/dL (ref 12.0–15.0)
MCH: 29 pg (ref 26.0–34.0)
MCHC: 33.4 g/dL (ref 30.0–36.0)
MCV: 86.7 fL (ref 80.0–100.0)
Platelets: 401 10*3/uL — ABNORMAL HIGH (ref 150–400)
RBC: 4.52 MIL/uL (ref 3.87–5.11)
RDW: 13.5 % (ref 11.5–15.5)
WBC: 22 10*3/uL — ABNORMAL HIGH (ref 4.0–10.5)
nRBC: 0 % (ref 0.0–0.2)

## 2020-04-02 LAB — LIPASE, BLOOD: Lipase: 25 U/L (ref 11–51)

## 2020-04-02 MED ORDER — SODIUM CHLORIDE 0.9 % IV SOLN
1.0000 g | Freq: Once | INTRAVENOUS | Status: AC
  Administered 2020-04-03: 1 g via INTRAVENOUS
  Filled 2020-04-02: qty 10

## 2020-04-02 MED ORDER — IOHEXOL 300 MG/ML  SOLN
80.0000 mL | Freq: Once | INTRAMUSCULAR | Status: AC | PRN
  Administered 2020-04-02: 80 mL via INTRAVENOUS

## 2020-04-02 MED ORDER — ONDANSETRON HCL 4 MG/2ML IJ SOLN
4.0000 mg | Freq: Once | INTRAMUSCULAR | Status: AC
  Administered 2020-04-02: 4 mg via INTRAVENOUS
  Filled 2020-04-02: qty 2

## 2020-04-02 MED ORDER — MORPHINE SULFATE (PF) 4 MG/ML IV SOLN
2.0000 mg | Freq: Once | INTRAVENOUS | Status: AC
  Administered 2020-04-02: 2 mg via INTRAVENOUS
  Filled 2020-04-02: qty 1

## 2020-04-02 MED ORDER — SODIUM CHLORIDE 0.9 % IV SOLN: INTRAVENOUS | Status: DC

## 2020-04-02 NOTE — ED Notes (Signed)
Dr Miller at bedside. 

## 2020-04-02 NOTE — ED Notes (Signed)
Pt to CT at this time.

## 2020-04-02 NOTE — ED Provider Notes (Signed)
[facility] EMERGENCY DEPARTMENT Provider Note   CSN: [id number] Arrival date & time: 04/02/20  1938     History Chief Complaint  Patient presents with  . Nausea  . Vomiting    [name] is a 86 y.o. female.  HPI   This patient is a pleasant 86 year old female, she has a history of having some cognitive dysfunction, she is very with it and lives at [facility], she has diabetes and a history of a hemicolectomy secondary to diverticular disease.  She had a history of multiple bowel obstructions in the past as well.  This morning she noticed that her abdomen was distended, she has had multiple episodes of nausea and vomiting and has not had any gas or bowel movements throughout the day.  When she got to the hospital this evening she has now had 2 episodes of watery diarrhea.  No blood in the stools.  Symptoms are persistent and gradually worsening.  Nobody around her has been sick, she is tested twice a week for Covid and has had a full vaccination series, she has never tested positive.  Past Medical History:  Diagnosis Date  . Cognitive dysfunction 03/04/2019   Patient scores 21 out of 30 on [place] cognitive assessment September 2020  . Diabetes mellitus without complication ([place])   . Diverticulitis   . Frailty 03/04/2019  . H/O bilateral breast reduction surgery   . Hyperlipidemia    a. intolerant to statins.   . Hypertension     Patient Active Problem List   Diagnosis Date Noted  . Myalgia due to statin 02/15/2020  . Generalized anxiety disorder 06/05/2018  . Vomiting and diarrhea 03/08/2018  . Dehydration 03/08/2018  . Hyponatremia 03/08/2018  . C. difficile diarrhea 03/03/2018  . Acute lower UTI 03/03/2018  . Hyperlipidemia associated with type 2 diabetes mellitus ([place]) 10/02/2016  . Pedal edema 10/02/2016  . Insomnia 10/02/2016  . SBO (small bowel obstruction) ([place]) 07/02/2016  . Dyspnea on exertion 02/24/2016  . Other fatigue   . E-coli UTI 06/18/2015  .  Partial small bowel obstruction ([place]) 06/15/2015  . Ileitis, regional, with intestinal obstruction 06/15/2015  . Leukocytosis 12/16/2014  . Nausea and vomiting 12/15/2014  . HLD (hyperlipidemia) 12/15/2014  . Essential hypertension 12/15/2014  . Controlled type 2 diabetes mellitus without complication, without long-term current use of insulin ([place]) 02/15/2011    Past Surgical History:  Procedure Laterality Date  . ABDOMINAL HYSTERECTOMY    . APPENDECTOMY    . BREAST REDUCTION SURGERY    . COLON SURGERY Left    Hemicolectomy due to diverticulitis  . EYE SURGERY  03/30/2009   cataract  . KNEE SURGERY    . LAPAROSCOPIC INCISIONAL / UMBILICAL / VENTRAL HERNIA REPAIR  02/23/2007   . REDUCTION MAMMAPLASTY Bilateral 2001  . REFRACTIVE SURGERY       OB History    Gravida      Para      Term      Preterm      AB      Living  1     SAB      TAB      Ectopic      Multiple      Live Births              Family History  Problem Relation Age of Onset  . Hypertension Father        kidney  . Stroke Father   . Cancer Father   .  Cancer Mother        ovaian  . Diabetes Brother   . Hypertension Sister     Social History   Tobacco Use  . Smoking status: Never Smoker  . Smokeless tobacco: Never Used  Vaping Use  . Vaping Use: Never used  Substance Use Topics  . Alcohol use: No  . Drug use: No    Home Medications Prior to Admission medications   Medication Sig Start Date End Date Taking? Authorizing Provider  ACETAMINOPHEN EXTRA STRENGTH 500 MG tablet TAKE (1) TABLET BY MOUTH EVERY (6) HOURS AS NEEDED FOR PAIN.**DO NOT EXCEED 8 TABLETS IN 24 HOURS** 03/22/20   Sallee Lange A, MD  hydrOXYzine (ATARAX/VISTARIL) 25 MG tablet TAKE 1 TABLET BY MOUTH 3 TIMES A DAY AS NEEDED FOR ITCHING. 03/03/20   Kathyrn Drown, MD  ALPRAZolam (XANAX) 0.25 MG tablet TAKE 1 TABLET BY MOUTH TWICE A DAY. 02/15/20   Kathyrn Drown, MD  amLODipine (NORVASC) 10 MG tablet Take 1 tablet  (10 mg total) by mouth daily. 02/15/20   Kathyrn Drown, MD  ANTI-DIARRHEAL 2 MG tablet TAKE 1 TABLET BY MOUTH 3 TIMES A DAY AS NEEDED FOR DIARRHEA. 10/30/19   Kathyrn Drown, MD  beta carotene w/minerals (OCUVITE) tablet TAKE 1 TABLET BY MOUTH AT BEDTIME. 10/08/19   Kathyrn Drown, MD  Cyanocobalamin (VITAMIN B-12) 2500 MCG SUBL TAKE 1/2 TABLET BY MOUTH AT BEDTIME. 03/17/20   Luking, Elayne Snare, MD  Cyanocobalamin 2500 MCG TABS Take 2,500 mcg by mouth daily.     [provider]  dicyclomine (BENTYL) 10 MG capsule 1 bid prn abd cramps and loose stool 03/23/19   Kathyrn Drown, MD  famotidine (PEPCID) 20 MG tablet TAKE 1 TABLET BY MOUTH TWICE DAILY. 09/08/19   Kathyrn Drown, MD  hydrALAZINE (APRESOLINE) 25 MG tablet TAKE 1 TABLET BY MOUTH TWICE A DAY. 03/17/20   Kathyrn Drown, MD  hydrochlorothiazide (HYDRODIURIL) 25 MG tablet Take 1 tablet (25 mg total) by mouth daily. 02/15/20   Kathyrn Drown, MD  lactobacillus acidophilus (BACID) TABS tablet Take 2 tablets by mouth 3 (three) times daily.    [provider]  losartan (COZAAR) 50 MG tablet Take 1 tablet (50 mg total) by mouth daily. 02/29/20   Kathyrn Drown, MD  metFORMIN (GLUCOPHAGE) 500 MG tablet Take one tablet 500mg  po in the morning and 1/2 tablet 250 mg po at supper 02/15/20   Kathyrn Drown, MD  OVER THE COUNTER MEDICATION Vitamin b -12 2500 mcg sublingual tablet #15 take one half tablet by mouth at bedtime. ( dr scott hand wrote rx on 04/15/19 and gave one year of refills since not able to find in epic)    [provider]  pantoprazole (PROTONIX) 20 MG tablet TAKE (1) TABLET BY MOUTH ONCE DAILY. 02/15/20   Kathyrn Drown, MD  QC HEMORRHOIDAL 0.25-14-74.9 % rectal ointment APPLY TO RECTUM 3 TIMES DAILY AS NEEDED FOR IRRITATION. 10/30/19   Kathyrn Drown, MD  saccharomyces boulardii (FLORASTOR) 250 MG capsule Take 1 capsule (250 mg total) by mouth 2 (two) times daily. 03/12/18   Kathie Dike, MD  Vitamin D,  Cholecalciferol, 10 MCG (400 UNIT) TABS TAKE 1 TABLET BY MOUTH ONCE DAILY. 12/13/19   Kathyrn Drown, MD    Allergies    Hydrocortisone, Lisinopril, Phenergan [promethazine hcl], Statins, Trazodone and nefazodone, and Prednisone  Review of Systems   Review of Systems  All other systems reviewed  and are negative.   Physical Exam Updated Vital Signs BP (!) 132/48 (BP Location: Right Arm)   Pulse 99   Temp 98.8 F (37.1 C) (Oral)   Resp 18   Ht 1.727 m (5\' 8" )   Wt 75.3 kg   SpO2 95%   BMI 25.24 kg/m   Physical Exam Vitals and nursing note reviewed.  Constitutional:      General: She is not in acute distress.    Appearance: She is well-developed.  HENT:     Head: Normocephalic and atraumatic.     Mouth/Throat:     Pharynx: No oropharyngeal exudate.  Eyes:     General: No scleral icterus.       Right eye: No discharge.        Left eye: No discharge.     Conjunctiva/sclera: Conjunctivae normal.     Pupils: Pupils are equal, round, and reactive to light.  Neck:     Thyroid: No thyromegaly.     Vascular: No JVD.  Cardiovascular:     Rate and Rhythm: Normal rate and regular rhythm.     Heart sounds: Normal heart sounds. No murmur heard.  No friction rub. No gallop.   Pulmonary:     Effort: Pulmonary effort is normal. No respiratory distress.     Breath sounds: Normal breath sounds. No wheezing or rales.  Abdominal:     General: There is distension.     Palpations: Abdomen is soft. There is no mass.     Tenderness: There is abdominal tenderness.     Comments: Mild diffuse tenderness, no guarding or peritoneal signs, tympanic sounds to percussion diffusely throughout the abdomen, decreased to no bowel sounds present  Musculoskeletal:        General: No tenderness. Normal range of motion.     Cervical back: Normal range of motion and neck supple.  Lymphadenopathy:     Cervical: No cervical adenopathy.  Skin:    General: Skin is warm and dry.     Findings: No erythema  or rash.  Neurological:     Mental Status: She is alert.     Coordination: Coordination normal.  Psychiatric:        Behavior: Behavior normal.     ED Results / Procedures / Treatments   Labs (all labs ordered are listed, but only abnormal results are displayed) Labs Reviewed  URINALYSIS, ROUTINE W REFLEX MICROSCOPIC - Abnormal; Notable for the following components:      Result Value   APPearance CLOUDY (*)    Protein, ur 30 (*)    Leukocytes,Ua LARGE (*)    WBC, UA >50 (*)    Bacteria, UA RARE (*)    Non Squamous Epithelial 0-5 (*)    All other components within normal limits  CBC - Abnormal; Notable for the following components:   WBC 22.0 (*)    Platelets 401 (*)    All other components within normal limits  BASIC METABOLIC PANEL - Abnormal; Notable for the following components:   Sodium 132 (*)    Chloride 95 (*)    Glucose, Bld 170 (*)    BUN 26 (*)    Creatinine, Ser 1.13 (*)    GFR calc non Af Amer 44 (*)    GFR calc Af Amer 51 (*)    All other components within normal limits  URINE CULTURE  HEPATIC FUNCTION PANEL  LIPASE, BLOOD    EKG None  Radiology CT ABDOMEN PELVIS W CONTRAST  Addendum  Date: 04/02/2020   ADDENDUM REPORT: 04/02/2020 23:29 ADDENDUM: Results were discussed with Dr. Sabra Heck at 11:22 p.m. [place] on April 02, 2020. Electronically Signed   By: Virgina Norfolk M.D.   On: 04/02/2020 23:29   Result Date: 04/02/2020 CLINICAL DATA:  Abdominal distension. EXAM: CT ABDOMEN AND PELVIS WITH CONTRAST TECHNIQUE: Multidetector CT imaging of the abdomen and pelvis was performed using the standard protocol following bolus administration of intravenous contrast. CONTRAST:  74mL OMNIPAQUE IOHEXOL 300 MG/ML  SOLN COMPARISON:  August 14, 2018 FINDINGS: Lower chest: No acute abnormality. Hepatobiliary: No focal liver abnormality is seen. A very small amount of posterior perihepatic fluid is seen. Numerous subcentimeter gallstones are seen within the gallbladder  lumen without evidence of gallbladder wall thickening or biliary dilatation. Pancreas: Unremarkable. No pancreatic ductal dilatation or surrounding inflammatory changes. Spleen: Normal in size without focal abnormality. Adrenals/Urinary Tract: Adrenal glands are unremarkable. Kidneys are normal, without renal calculi, focal lesion, or hydronephrosis. Very mild diffuse urinary bladder wall thickening is seen. Stomach/Bowel: There is a small hiatal hernia. The appendix is not clearly identified. Multiple dilated small bowel loops are seen within the abdomen and pelvis (maximum small bowel diameter of approximately 3.1 cm). A transition zone is seen within the left lower quadrant. Vascular/Lymphatic: There is marked severity calcification of the abdominal aorta and bilateral common iliac arteries, without evidence of aneurysmal dilatation. No enlarged abdominal or pelvic lymph nodes. Reproductive: Status post hysterectomy. No adnexal masses. Other: Radiopaque surgical coils are seen along the anterior aspect of the abdomen and pelvis. A small amount of posterior pelvic free fluid is noted. Musculoskeletal: Multilevel degenerative changes seen throughout the lumbar spine IMPRESSION: 1. Partial small bowel obstruction. 2. Cholelithiasis. 3. Small hiatal hernia. 4. Very small amount of ascites. 5. Aortic atherosclerosis. Aortic Atherosclerosis (ICD10-I70.0). Electronically Signed: By: Virgina Norfolk M.D. On: 04/02/2020 23:24    Procedures Procedures (including critical care time)  Medications Ordered in ED Medications  0.9 %  sodium chloride infusion (has no administration in time range)  cefTRIAXone (ROCEPHIN) 1 g in sodium chloride 0.9 % 100 mL IVPB (has no administration in time range)  ondansetron (ZOFRAN) injection 4 mg (4 mg Intravenous Given 04/02/20 2235)  morphine 4 MG/ML injection 2 mg (2 mg Intravenous Given 04/02/20 2238)  iohexol (OMNIPAQUE) 300 MG/ML solution 80 mL (80 mLs Intravenous Contrast  Given 04/02/20 2300)    ED Course  I have reviewed the triage vital signs and the nursing notes.  Pertinent labs & imaging results that were available during my care of the patient were reviewed by me and considered in my medical decision making (see chart for details).  Clinical Course as of Apr 02 2336  Nancy Fetter Apr 02, 2020  0623 The patient certainly has a urinary tract infection with greater than 50 white blood cells and large leukocytes, a white blood cell count of 22,000, thankfully renal function is preserved, glucose is 170 with mild hyponatremia.  Urine culture ordered, antibiotics will be ordered, CT scan pending to rule out other causes of intra-abdominal processes such as bowel obstruction   [BM]  2328 I discussed the case with radiologist who also agrees that there is a small bowel obstruction.   [BM]    Clinical Course User Index [BM] Noemi Chapel, MD   MDM Rules/Calculators/A&P                          I suspect this patient has recurrent bowel obstruction or  other intra-abdominal process.  Now that she is having some diarrhea this may be more of a gastroenteritis however given multiple episodes of vomiting and not passing any gas she will need a CT scan.  Labs ordered, Zofran pain medication and IV fluids.  Patient agreeable.  The patient's lab work shows a leukocytosis of 22,000, there is a basic metabolic panel showing a mild hyponatremia, hepatic function which is unremarkable as well as a normal lipase.  Urinalysis treated with Rocephin, CT scan has been performed and according to my interpretation it appears there is likely an element of small bowel obstruction.  NG tube ordered, will consult with hospitalist for admission  D/w hospitalist who has been kind enough to evaluate for admission.  Final Clinical Impression(s) / ED Diagnoses Final diagnoses:  SBO (small bowel obstruction) ([place])  Acute cystitis without hematuria    Rx / DC Orders ED Discharge Orders     None       Noemi Chapel, MD 04/02/20 2337

## 2020-04-02 NOTE — ED Notes (Signed)
Pt up and ambulatory to restroom without assistance from staff at this time.

## 2020-04-02 NOTE — ED Triage Notes (Addendum)
Patient brought in by [facility] from [facility] complaining of nausea/vomitting that started this morning. Pt also states stomach feels distended. Pt had two episodes of diarrhea during triage.

## 2020-04-03 ENCOUNTER — Other Ambulatory Visit: Payer: Self-pay

## 2020-04-03 ENCOUNTER — Encounter (HOSPITAL_COMMUNITY): Payer: Self-pay | Admitting: Family Medicine

## 2020-04-03 DIAGNOSIS — K56609 Unspecified intestinal obstruction, unspecified as to partial versus complete obstruction: Secondary | ICD-10-CM

## 2020-04-03 DIAGNOSIS — N3000 Acute cystitis without hematuria: Secondary | ICD-10-CM

## 2020-04-03 LAB — CBC WITH DIFFERENTIAL/PLATELET
Abs Immature Granulocytes: 0.04 10*3/uL (ref 0.00–0.07)
Basophils Absolute: 0 10*3/uL (ref 0.0–0.1)
Basophils Relative: 0 %
Eosinophils Absolute: 0.2 10*3/uL (ref 0.0–0.5)
Eosinophils Relative: 2 %
HCT: 38.2 % (ref 36.0–46.0)
Hemoglobin: 12.3 g/dL (ref 12.0–15.0)
Immature Granulocytes: 0 %
Lymphocytes Relative: 16 %
Lymphs Abs: 1.7 10*3/uL (ref 0.7–4.0)
MCH: 28 pg (ref 26.0–34.0)
MCHC: 32.2 g/dL (ref 30.0–36.0)
MCV: 87 fL (ref 80.0–100.0)
Monocytes Absolute: 1 10*3/uL (ref 0.1–1.0)
Monocytes Relative: 9 %
Neutro Abs: 7.8 10*3/uL — ABNORMAL HIGH (ref 1.7–7.7)
Neutrophils Relative %: 73 %
Platelets: 356 10*3/uL (ref 150–400)
RBC: 4.39 MIL/uL (ref 3.87–5.11)
RDW: 13.6 % (ref 11.5–15.5)
WBC: 10.7 10*3/uL — ABNORMAL HIGH (ref 4.0–10.5)
nRBC: 0 % (ref 0.0–0.2)

## 2020-04-03 LAB — COMPREHENSIVE METABOLIC PANEL
ALT: 15 U/L (ref 0–44)
AST: 23 U/L (ref 15–41)
Albumin: 3.6 g/dL (ref 3.5–5.0)
Alkaline Phosphatase: 76 U/L (ref 38–126)
Anion gap: 11 (ref 5–15)
BUN: 22 mg/dL (ref 8–23)
CO2: 28 mmol/L (ref 22–32)
Calcium: 9 mg/dL (ref 8.9–10.3)
Chloride: 97 mmol/L — ABNORMAL LOW (ref 98–111)
Creatinine, Ser: 1.01 mg/dL — ABNORMAL HIGH (ref 0.44–1.00)
GFR calc Af Amer: 58 mL/min — ABNORMAL LOW (ref >60)
GFR calc non Af Amer: 50 mL/min — ABNORMAL LOW (ref >60)
Glucose, Bld: 149 mg/dL — ABNORMAL HIGH (ref 70–99)
Potassium: 4.4 mmol/L (ref 3.5–5.1)
Sodium: 136 mmol/L (ref 135–145)
Total Bilirubin: 1.1 mg/dL (ref 0.3–1.2)
Total Protein: 6.7 g/dL (ref 6.5–8.1)

## 2020-04-03 LAB — HEMOGLOBIN A1C
Hgb A1c MFr Bld: 6.9 % — ABNORMAL HIGH (ref 4.8–5.6)
Mean Plasma Glucose: 151.33 mg/dL

## 2020-04-03 LAB — CBG MONITORING, ED
Glucose-Capillary: 143 mg/dL — ABNORMAL HIGH (ref 70–99)
Glucose-Capillary: 115 mg/dL — ABNORMAL HIGH (ref 70–99)

## 2020-04-03 LAB — GLUCOSE, CAPILLARY
Glucose-Capillary: 119 mg/dL — ABNORMAL HIGH (ref 70–99)
Glucose-Capillary: 117 mg/dL — ABNORMAL HIGH (ref 70–99)

## 2020-04-03 LAB — MAGNESIUM: Magnesium: 1.8 mg/dL (ref 1.7–2.4)

## 2020-04-03 LAB — RESPIRATORY PANEL BY RT PCR (FLU A&B, COVID)
Influenza A by PCR: NEGATIVE
Influenza B by PCR: NEGATIVE
SARS Coronavirus 2 by RT PCR: NEGATIVE

## 2020-04-03 MED ORDER — INSULIN ASPART 100 UNIT/ML ~~LOC~~ SOLN
0.0000 [IU] | Freq: Three times a day (TID) | SUBCUTANEOUS | Status: DC
  Administered 2020-04-03 – 2020-04-06 (×2): 2 [IU] via SUBCUTANEOUS
  Filled 2020-04-03: qty 1

## 2020-04-03 MED ORDER — SODIUM CHLORIDE 0.9 % IV SOLN
1.0000 g | INTRAVENOUS | Status: DC
  Administered 2020-04-03 – 2020-04-06 (×3): 1 g via INTRAVENOUS
  Filled 2020-04-03 (×4): qty 10

## 2020-04-03 MED ORDER — HEPARIN SODIUM (PORCINE) 5000 UNIT/ML IJ SOLN
5000.0000 [IU] | Freq: Three times a day (TID) | INTRAMUSCULAR | Status: DC
  Administered 2020-04-03 – 2020-04-06 (×9): 5000 [IU] via SUBCUTANEOUS
  Filled 2020-04-03 (×9): qty 1

## 2020-04-03 MED ORDER — ONDANSETRON HCL 4 MG PO TABS: 4.0000 mg | ORAL_TABLET | Freq: Four times a day (QID) | ORAL | Status: DC | PRN

## 2020-04-03 MED ORDER — HYDRALAZINE HCL 20 MG/ML IJ SOLN: 10.0000 mg | Freq: Three times a day (TID) | INTRAMUSCULAR | Status: DC | PRN

## 2020-04-03 MED ORDER — SODIUM CHLORIDE 0.9 % IV SOLN: INTRAVENOUS | Status: DC

## 2020-04-03 MED ORDER — INFLUENZA VAC A&B SA ADJ QUAD 0.5 ML IM PRSY
0.5000 mL | PREFILLED_SYRINGE | INTRAMUSCULAR | Status: AC
  Administered 2020-04-04: 0.5 mL via INTRAMUSCULAR
  Filled 2020-04-03: qty 0.5

## 2020-04-03 MED ORDER — LORAZEPAM 2 MG/ML IJ SOLN
0.5000 mg | Freq: Four times a day (QID) | INTRAMUSCULAR | Status: DC | PRN
  Administered 2020-04-03 – 2020-04-06 (×7): 0.5 mg via INTRAVENOUS
  Filled 2020-04-03 (×7): qty 1

## 2020-04-03 MED ORDER — ONDANSETRON HCL 4 MG/2ML IJ SOLN: 4.0000 mg | Freq: Four times a day (QID) | INTRAMUSCULAR | Status: DC | PRN

## 2020-04-03 MED ORDER — MORPHINE SULFATE (PF) 4 MG/ML IV SOLN: 4.0000 mg | INTRAVENOUS | Status: DC | PRN

## 2020-04-03 MED ORDER — POLYVINYL ALCOHOL 1.4 % OP SOLN: 1.0000 [drp] | OPHTHALMIC | Status: DC | PRN

## 2020-04-03 MED ORDER — ACETAMINOPHEN 325 MG PO TABS: 650.0000 mg | ORAL_TABLET | Freq: Four times a day (QID) | ORAL | Status: DC | PRN

## 2020-04-03 MED ORDER — ACETAMINOPHEN 650 MG RE SUPP: 650.0000 mg | Freq: Four times a day (QID) | RECTAL | Status: DC | PRN

## 2020-04-03 NOTE — H&P (Signed)
TRH H&P    Patient Demographics:    [name], is a 86 y.o. female  MRN: [id number]  DOB - 1932/06/17  Admit Date - 04/02/2020  Referring MD/NP/PA: Sabra Heck  Outpatient Primary MD for the patient is Kathyrn Drown, MD  Patient coming from: Assisted living  Chief complaint-emesis   HPI:    [name]  is a 86 y.o. female, with history of hemicolectomy secondary to diverticulitis, hypertension, hyperlipidemia, diabetes mellitus type 2, and more presents to the ED with a chief complaint of emesis.  Patient reports that she has been throwing up all day.  She reports that she is attempted a dry crackers, Gatorade, apple juice and has thrown it all up.  Emesis occurs within 1 hour after eating, and sometimes between attempts to eat as well.  Last normal meal was sausage and bread at breakfast, which she also threw up.  Her emesis is nonbloody.  She reports she has associated abdominal pain, is not passing flatus, and her stomach felt tight earlier.  She reports her last normal bowel movement being day before yesterday.  Patient normally goes every day.  Today she did have 3 episodes of watery diarrhea, after she got to the hospital.  Patient reports that she has had a history of 2 small bowel obstructions, and this 1 feels like that.  She reports that her abdominal pain is crampy comes in waves, and was improved with morphine.  Of note patient lives in an assisted living facility.  She gets tested for Covid twice per week.  She is also vaccinated for Covid.  In the ED Temperature 98.8, heart rate 99, respiratory rate 18, blood pressure 132/48 Leukocytosis 22, thrombocytosis 401, Creatinine very near to baseline at 1.13 Glucose 170 UA is suspicious for UTI, urine culture pending CT abdomen shows partial small bowel obstruction, cholelithiasis, small hiatal hernia, small amount of ascites, and are aortic  atherosclerosis. Patient was started on Rocephin, normal saline infusion, morphine, Zofran in the ED    Review of systems:    In addition to the HPI above,  No Fever-chills, No Headache, No changes with Vision or hearing, No problems swallowing food or Liquids, No Chest pain, Cough or Shortness of Breath, Positive for abdominal pain, nausea, vomiting, diarrhea No Blood in stool or Urine, No dysuria, No new skin rashes or bruises, No new joints pains-aches,  No new weakness, tingling, numbness in any extremity, No recent weight gain or loss, No polyuria, polydypsia or polyphagia, No significant Mental Stressors.  All other systems reviewed and are negative.    Past History of the following :    Past Medical History:  Diagnosis Date  . Cognitive dysfunction 03/04/2019   Patient scores 21 out of 30 on [place] cognitive assessment September 2020  . Diabetes mellitus without complication ([place])   . Diverticulitis   . Frailty 03/04/2019  . H/O bilateral breast reduction surgery   . Hyperlipidemia    a. intolerant to statins.   . Hypertension       Past Surgical History:  Procedure Laterality Date  . ABDOMINAL HYSTERECTOMY    . APPENDECTOMY    . BREAST REDUCTION SURGERY    . COLON SURGERY Left    Hemicolectomy due to diverticulitis  . EYE SURGERY  03/30/2009   cataract  . KNEE SURGERY    . LAPAROSCOPIC INCISIONAL / UMBILICAL / VENTRAL HERNIA REPAIR  02/23/2007   . REDUCTION MAMMAPLASTY Bilateral 2001  . REFRACTIVE SURGERY        Social History:      Social History   Tobacco Use  . Smoking status: Never Smoker  . Smokeless tobacco: Never Used  Substance Use Topics  . Alcohol use: No       Family History :     Family History  Problem Relation Age of Onset  . Hypertension Father        kidney  . Stroke Father   . Cancer Father   . Cancer Mother        ovaian  . Diabetes Brother   . Hypertension Sister      Home Medications:   Prior to Admission  medications   Medication Sig Start Date End Date Taking? Authorizing Provider  ACETAMINOPHEN EXTRA STRENGTH 500 MG tablet TAKE (1) TABLET BY MOUTH EVERY (6) HOURS AS NEEDED FOR PAIN.**DO NOT EXCEED 8 TABLETS IN 24 HOURS** 03/22/20   Sallee Lange A, MD  hydrOXYzine (ATARAX/VISTARIL) 25 MG tablet TAKE 1 TABLET BY MOUTH 3 TIMES A DAY AS NEEDED FOR ITCHING. 03/03/20   Kathyrn Drown, MD  ALPRAZolam (XANAX) 0.25 MG tablet TAKE 1 TABLET BY MOUTH TWICE A DAY. 02/15/20   Kathyrn Drown, MD  amLODipine (NORVASC) 10 MG tablet Take 1 tablet (10 mg total) by mouth daily. 02/15/20   Kathyrn Drown, MD  ANTI-DIARRHEAL 2 MG tablet TAKE 1 TABLET BY MOUTH 3 TIMES A DAY AS NEEDED FOR DIARRHEA. 10/30/19   Kathyrn Drown, MD  beta carotene w/minerals (OCUVITE) tablet TAKE 1 TABLET BY MOUTH AT BEDTIME. 10/08/19   Kathyrn Drown, MD  Cyanocobalamin (VITAMIN B-12) 2500 MCG SUBL TAKE 1/2 TABLET BY MOUTH AT BEDTIME. 03/17/20   Luking, Elayne Snare, MD  Cyanocobalamin 2500 MCG TABS Take 2,500 mcg by mouth daily.     [provider]  dicyclomine (BENTYL) 10 MG capsule 1 bid prn abd cramps and loose stool 03/23/19   Kathyrn Drown, MD  famotidine (PEPCID) 20 MG tablet TAKE 1 TABLET BY MOUTH TWICE DAILY. 09/08/19   Kathyrn Drown, MD  hydrALAZINE (APRESOLINE) 25 MG tablet TAKE 1 TABLET BY MOUTH TWICE A DAY. 03/17/20   Kathyrn Drown, MD  hydrochlorothiazide (HYDRODIURIL) 25 MG tablet Take 1 tablet (25 mg total) by mouth daily. 02/15/20   Kathyrn Drown, MD  lactobacillus acidophilus (BACID) TABS tablet Take 2 tablets by mouth 3 (three) times daily.    [provider]  losartan (COZAAR) 50 MG tablet Take 1 tablet (50 mg total) by mouth daily. 02/29/20   Kathyrn Drown, MD  metFORMIN (GLUCOPHAGE) 500 MG tablet Take one tablet 500mg  po in the morning and 1/2 tablet 250 mg po at supper 02/15/20   Kathyrn Drown, MD  OVER THE COUNTER MEDICATION Vitamin b -12 2500 mcg sublingual tablet #15 take one half tablet by mouth at  bedtime. ( dr scott hand wrote rx on 04/15/19 and gave one year of refills since not able to find in epic)    [provider]  pantoprazole (PROTONIX) 20 MG tablet TAKE (1)  TABLET BY MOUTH ONCE DAILY. 02/15/20   Kathyrn Drown, MD  QC HEMORRHOIDAL 0.25-14-74.9 % rectal ointment APPLY TO RECTUM 3 TIMES DAILY AS NEEDED FOR IRRITATION. 10/30/19   Kathyrn Drown, MD  saccharomyces boulardii (FLORASTOR) 250 MG capsule Take 1 capsule (250 mg total) by mouth 2 (two) times daily. 03/12/18   Kathie Dike, MD  Vitamin D, Cholecalciferol, 10 MCG (400 UNIT) TABS TAKE 1 TABLET BY MOUTH ONCE DAILY. 12/13/19   Kathyrn Drown, MD     Allergies:     Allergies  Allergen Reactions  . Hydrocortisone Itching  . Lisinopril     Other reaction(s): Lethargy (intolerance)  . Phenergan [Promethazine Hcl] Other (See Comments)    hallucinations  . Statins Other (See Comments)    Severe myalgias  . Trazodone And Nefazodone Cough  . Prednisone Rash     Physical Exam:   Vitals  Blood pressure (!) 132/48, pulse 99, temperature 98.8 F (37.1 C), temperature source Oral, resp. rate 18, height 5\' 8"  (1.727 m), weight 75.3 kg, SpO2 95 %.  1.  General: Lying supine in bed no acute distress  2. Psychiatric: Behavior and mood are normal for situation, oriented x3  3. Neurologic: Exam is nonfocal, patient has chronic left eyelid droop, moves all 4 extremities voluntarily  4. HEENMT:  Head is atraumatic, normocephalic, pupils reactive to light, neck is supple, trachea is midline, mucous membranes are moist  5. Respiratory : Lungs are clear to auscultation bilaterally  6. Cardiovascular : Heart rate and rhythm are regular no peripheral edema  7. Gastrointestinal:  Abdomen is soft, mildly distended, tender to palpation diffusely but especially in the right lower quadrant, no guarding  8. Skin:  No acute lesions on limited skin exam  9.Musculoskeletal:  No acute deformity    Data Review:     CBC Recent Labs  Lab 04/02/20 2144  WBC 22.0*  HGB 13.1  HCT 39.2  PLT 401*  MCV 86.7  MCH 29.0  MCHC 33.4  RDW 13.5   ------------------------------------------------------------------------------------------------------------------  Results for orders placed or performed during the hospital encounter of 04/02/20 (from the past 48 hour(s))  Urinalysis, Routine w reflex microscopic Urine, Random     Status: Abnormal   Collection Time: 04/02/20  8:03 PM  Result Value Ref Range   Color, Urine YELLOW YELLOW   APPearance CLOUDY (A) CLEAR   Specific Gravity, Urine 1.013 1.005 - 1.030   pH 8.0 5.0 - 8.0   Glucose, UA NEGATIVE NEGATIVE mg/dL   Hgb urine dipstick NEGATIVE NEGATIVE   Bilirubin Urine NEGATIVE NEGATIVE   Ketones, ur NEGATIVE NEGATIVE mg/dL   Protein, ur 30 (A) NEGATIVE mg/dL   Nitrite NEGATIVE NEGATIVE   Leukocytes,Ua LARGE (A) NEGATIVE   RBC / HPF 0-5 0 - 5 RBC/hpf   WBC, UA >50 (H) 0 - 5 WBC/hpf   Bacteria, UA RARE (A) NONE SEEN   Squamous Epithelial / LPF 0-5 0 - 5   WBC Clumps PRESENT    Non Squamous Epithelial 0-5 (A) NONE SEEN    Comment: Performed at [facility], [address], [place], [place] [address]  CBC     Status: Abnormal   Collection Time: 04/02/20  9:44 PM  Result Value Ref Range   WBC 22.0 (H) 4.0 - 10.5 K/uL   RBC 4.52 3.87 - 5.11 MIL/uL   Hemoglobin 13.1 12.0 - 15.0 g/dL   HCT 39.2 36 - 46 %   MCV 86.7 80.0 - 100.0 fL  MCH 29.0 26.0 - 34.0 pg   MCHC 33.4 30.0 - 36.0 g/dL   RDW 13.5 11.5 - 15.5 %   Platelets 401 (H) 150 - 400 K/uL   nRBC 0.0 0.0 - 0.2 %    Comment: Performed at [facility], [address], [place], [place] [address]  Basic metabolic panel     Status: Abnormal   Collection Time: 04/02/20  9:44 PM  Result Value Ref Range   Sodium 132 (L) 135 - 145 mmol/L   Potassium 3.7 3.5 - 5.1 mmol/L   Chloride 95 (L) 98 - 111 mmol/L   CO2 25 22 - 32 mmol/L   Glucose, Bld 170 (H) 70 - 99 mg/dL    Comment: Glucose reference range  applies only to samples taken after fasting for at least 8 hours.   BUN 26 (H) 8 - 23 mg/dL   Creatinine, Ser 1.13 (H) 0.44 - 1.00 mg/dL   Calcium 9.6 8.9 - 10.3 mg/dL   GFR calc non Af Amer 44 (L) >60 mL/min   GFR calc Af Amer 51 (L) >60 mL/min   Anion gap 12 5 - 15    Comment: Performed at [facility], [address], [place], [place] [address]  Hepatic function panel     Status: None   Collection Time: 04/02/20  9:44 PM  Result Value Ref Range   Total Protein 7.5 6.5 - 8.1 g/dL   Albumin 4.3 3.5 - 5.0 g/dL   AST 27 15 - 41 U/L   ALT 20 0 - 44 U/L   Alkaline Phosphatase 91 38 - 126 U/L   Total Bilirubin 0.9 0.3 - 1.2 mg/dL   Bilirubin, Direct 0.1 0.0 - 0.2 mg/dL   Indirect Bilirubin 0.8 0.3 - 0.9 mg/dL    Comment: Performed at [facility], [address], [place], [place] [address]  Lipase, blood     Status: None   Collection Time: 04/02/20  9:44 PM  Result Value Ref Range   Lipase 25 11 - 51 U/L    Comment: Performed at [facility], [address] [place], [place] [address]    Chemistries  Recent Labs  Lab 04/02/20 2144  NA 132*  K 3.7  CL 95*  CO2 25  GLUCOSE 170*  BUN 26*  CREATININE 1.13*  CALCIUM 9.6  AST 27  ALT 20  ALKPHOS 91  BILITOT 0.9   ------------------------------------------------------------------------------------------------------------------  ------------------------------------------------------------------------------------------------------------------ GFR: Estimated Creatinine Clearance: 36 mL/min (A) (by C-G formula based on SCr of 1.13 mg/dL (H)). Liver Function Tests: Recent Labs  Lab 04/02/20 2144  AST 27  ALT 20  ALKPHOS 91  BILITOT 0.9  PROT 7.5  ALBUMIN 4.3   Recent Labs  Lab 04/02/20 2144  LIPASE 25   No results for input(s): AMMONIA in the last 168 hours. Coagulation Profile: No results for input(s): INR, PROTIME in the last 168 hours. Cardiac Enzymes: No results for input(s): CKTOTAL, CKMB, CKMBINDEX, TROPONINI  in the last 168 hours. BNP (last 3 results) No results for input(s): PROBNP in the last 8760 hours. HbA1C: No results for input(s): HGBA1C in the last 72 hours. CBG: No results for input(s): GLUCAP in the last 168 hours. Lipid Profile: No results for input(s): CHOL, HDL, LDLCALC, TRIG, CHOLHDL, LDLDIRECT in the last 72 hours. Thyroid Function Tests: No results for input(s): TSH, T4TOTAL, FREET4, T3FREE, THYROIDAB in the last 72 hours. Anemia Panel: No results for input(s): VITAMINB12, FOLATE, FERRITIN, TIBC, IRON, RETICCTPCT in the last 72 hours.  ---------------------------------------------------------------------------------------------------------------  Urine analysis:    Component Value Date/Time   COLORURINE YELLOW 04/02/2020 2003   APPEARANCEUR CLOUDY (A) 04/02/2020 2003   LABSPEC 1.013 04/02/2020 2003   PHURINE 8.0 04/02/2020 2003   GLUCOSEU NEGATIVE 04/02/2020 2003   HGBUR NEGATIVE 04/02/2020 2003   [place] NEGATIVE 04/02/2020 2003   [place] NEGATIVE 04/02/2020 2003   PROTEINUR 30 (A) 04/02/2020 2003   UROBILINOGEN 0.2 12/15/2014 1956   NITRITE NEGATIVE 04/02/2020 2003   LEUKOCYTESUR LARGE (A) 04/02/2020 2003      Imaging Results:    CT ABDOMEN PELVIS W CONTRAST  Addendum Date: 04/02/2020   ADDENDUM REPORT: 04/02/2020 23:29 ADDENDUM: Results were discussed with Dr. Sabra Heck at 11:22 p.m. [place] on April 02, 2020. Electronically Signed   By: Virgina Norfolk M.D.   On: 04/02/2020 23:29   Result Date: 04/02/2020 CLINICAL DATA:  Abdominal distension. EXAM: CT ABDOMEN AND PELVIS WITH CONTRAST TECHNIQUE: Multidetector CT imaging of the abdomen and pelvis was performed using the standard protocol following bolus administration of intravenous contrast. CONTRAST:  42mL OMNIPAQUE IOHEXOL 300 MG/ML  SOLN COMPARISON:  August 14, 2018 FINDINGS: Lower chest: No acute abnormality. Hepatobiliary: No focal liver abnormality is seen. A very small amount of posterior  perihepatic fluid is seen. Numerous subcentimeter gallstones are seen within the gallbladder lumen without evidence of gallbladder wall thickening or biliary dilatation. Pancreas: Unremarkable. No pancreatic ductal dilatation or surrounding inflammatory changes. Spleen: Normal in size without focal abnormality. Adrenals/Urinary Tract: Adrenal glands are unremarkable. Kidneys are normal, without renal calculi, focal lesion, or hydronephrosis. Very mild diffuse urinary bladder wall thickening is seen. Stomach/Bowel: There is a small hiatal hernia. The appendix is not clearly identified. Multiple dilated small bowel loops are seen within the abdomen and pelvis (maximum small bowel diameter of approximately 3.1 cm). A transition zone is seen within the left lower quadrant. Vascular/Lymphatic: There is marked severity calcification of the abdominal aorta and bilateral common iliac arteries, without evidence of aneurysmal dilatation. No enlarged abdominal or pelvic lymph nodes. Reproductive: Status post hysterectomy. No adnexal masses. Other: Radiopaque surgical coils are seen along the anterior aspect of the abdomen and pelvis. A small amount of posterior pelvic free fluid is noted. Musculoskeletal: Multilevel degenerative changes seen throughout the lumbar spine IMPRESSION: 1. Partial small bowel obstruction. 2. Cholelithiasis. 3. Small hiatal hernia. 4. Very small amount of ascites. 5. Aortic atherosclerosis. Aortic Atherosclerosis (ICD10-I70.0). Electronically Signed: By: Virgina Norfolk M.D. On: 04/02/2020 23:24      Assessment & Plan:    Active Problems:   SBO (small bowel obstruction) ([place])   1. SBO 1. As shown on CT 2. 2/2 adhesions - hx of hemicolectomy 3. NG tube placed 4. NPO 5. Morphine for pain control 6. Gen surg consulted 2. UTI 1. Start rocephin 2. Urine culture pending 3. DMII 1. Sliding scale coverage 4. HTN 1. Holding PO meds 2/2 NG tube 2. PRN  hydralazine 5. Leukocytosis 1. 2/2 UTI and reactive 2/2 SBO 2. Continue to monitor   DVT Prophylaxis-   Heparin - SCDs   AM Labs Ordered, also please review Full Orders  Family Communication: No family at bedside  Code Status:  Full, but reports that she has a living will with more details  Admission status: Inpatient :The appropriate admission status for this patient is INPATIENT. Inpatient status is judged to be reasonable and necessary in order to provide the required intensity of service to ensure the patient's safety. The patient's presenting symptoms, physical exam findings, and initial radiographic and laboratory  data in the context of their chronic comorbidities is felt to place them at high risk for further clinical deterioration. Furthermore, it is not anticipated that the patient will be medically stable for discharge from the hospital within 2 midnights of admission. The following factors support the admission status of inpatient.     The patient's presenting symptoms include Abdominal pain and nausea The worrisome physical exam findings include abdominal tenderness and abdominal distention The initial radiographic and laboratory data are worrisome because of CT shows SBO The chronic co-morbidities include HTN, HLD, Diverticulosis, DMII       * I certify that at the point of admission it is my clinical judgment that the patient will require inpatient hospital care spanning beyond 2 midnights from the point of admission due to high intensity of service, high risk for further deterioration and high frequency of surveillance required.*  Time spent in minutes : [place]

## 2020-04-03 NOTE — ED Notes (Signed)
ED TO INPATIENT HANDOFF REPORT  ED Nurse Name and Phone #:  [phone]  S Name/Age/Gender [name] 86 y.o. female Room/Bed: APA11/APA11  Code Status   Code Status: Full Code  Home/SNF/Other Nursing Home Patient oriented to: self, place, time and situation Is this baseline? Yes   Triage Complete: Triage complete  Chief Complaint SBO (small bowel obstruction) ([place]) [P38.250]  Triage Note Patient brought in by [facility] from [facility] complaining of nausea/vomitting that started this morning. Pt also states stomach feels distended. Pt had two episodes of diarrhea during triage.     Allergies Allergies  Allergen Reactions  . Hydrocortisone Itching  . Lisinopril     Other reaction(s): Lethargy (intolerance)  . Phenergan [Promethazine Hcl] Other (See Comments)    hallucinations  . Statins Other (See Comments)    Severe myalgias  . Trazodone And Nefazodone Cough  . Prednisone Rash    Level of Care/Admitting Diagnosis ED Disposition    ED Disposition Condition [facility] Area: [facility] [[id number]]  Level of Care: Med-Surg [16]  Covid Evaluation: Asymptomatic Screening Protocol (No Symptoms)  Diagnosis: SBO (small bowel obstruction) [facility]) [[id number]]  Admitting Physician: Rolla Plate [[id number]]  Attending Physician: Rolla Plate [[id number]]  Estimated length of stay: past midnight tomorrow  Certification:: I certify this patient will need inpatient services for at least 2 midnights       B Medical/Surgery History Past Medical History:  Diagnosis Date  . Cognitive dysfunction 03/04/2019   Patient scores 21 out of 30 on [place] cognitive assessment September 2020  . Diabetes mellitus without complication ([place])   . Diverticulitis   . Frailty 03/04/2019  . H/O bilateral breast reduction surgery   . Hyperlipidemia    a. intolerant to statins.   . Hypertension    Past Surgical History:  Procedure Laterality Date  . ABDOMINAL  HYSTERECTOMY    . APPENDECTOMY    . BREAST REDUCTION SURGERY    . COLON SURGERY Left    Hemicolectomy due to diverticulitis  . EYE SURGERY  03/30/2009   cataract  . KNEE SURGERY    . LAPAROSCOPIC INCISIONAL / UMBILICAL / VENTRAL HERNIA REPAIR  02/23/2007   . REDUCTION MAMMAPLASTY Bilateral 2001  . REFRACTIVE SURGERY       A IV Location/Drains/Wounds Patient Lines/Drains/Airways Status    Active Line/Drains/Airways    Name Placement date Placement time Site Days   Peripheral IV 04/02/20 Left Antecubital 04/02/20  2142  Antecubital  1          Intake/Output Last 24 hours  Intake/Output Summary (Last 24 hours) at 04/03/2020 1434 Last data filed at 04/03/2020 4268 Gross per 24 hour  Intake 719.96 ml  Output 400 ml  Net 319.96 ml    Labs/Imaging Results for orders placed or performed during the hospital encounter of 04/02/20 (from the past 48 hour(s))  Urinalysis, Routine w reflex microscopic Urine, Random     Status: Abnormal   Collection Time: 04/02/20  8:03 PM  Result Value Ref Range   Color, Urine YELLOW YELLOW   APPearance CLOUDY (A) CLEAR   Specific Gravity, Urine 1.013 1.005 - 1.030   pH 8.0 5.0 - 8.0   Glucose, UA NEGATIVE NEGATIVE mg/dL   Hgb urine dipstick NEGATIVE NEGATIVE   Bilirubin Urine NEGATIVE NEGATIVE   Ketones, ur NEGATIVE NEGATIVE mg/dL   Protein, ur 30 (A) NEGATIVE mg/dL   Nitrite NEGATIVE NEGATIVE   Leukocytes,Ua LARGE (A) NEGATIVE   RBC /  HPF 0-5 0 - 5 RBC/hpf   WBC, UA >50 (H) 0 - 5 WBC/hpf   Bacteria, UA RARE (A) NONE SEEN   Squamous Epithelial / LPF 0-5 0 - 5   WBC Clumps PRESENT    Non Squamous Epithelial 0-5 (A) NONE SEEN    Comment: Performed at [facility], [address], [place], [place] [address]  CBC     Status: Abnormal   Collection Time: 04/02/20  9:44 PM  Result Value Ref Range   WBC 22.0 (H) 4.0 - 10.5 K/uL   RBC 4.52 3.87 - 5.11 MIL/uL   Hemoglobin 13.1 12.0 - 15.0 g/dL   HCT 39.2 36 - 46 %   MCV 86.7 80.0 - 100.0 fL    MCH 29.0 26.0 - 34.0 pg   MCHC 33.4 30.0 - 36.0 g/dL   RDW 13.5 11.5 - 15.5 %   Platelets 401 (H) 150 - 400 K/uL   nRBC 0.0 0.0 - 0.2 %    Comment: Performed at [facility], [address] [place], [place] [address]  Basic metabolic panel     Status: Abnormal   Collection Time: 04/02/20  9:44 PM  Result Value Ref Range   Sodium 132 (L) 135 - 145 mmol/L   Potassium 3.7 3.5 - 5.1 mmol/L   Chloride 95 (L) 98 - 111 mmol/L   CO2 25 22 - 32 mmol/L   Glucose, Bld 170 (H) 70 - 99 mg/dL    Comment: Glucose reference range applies only to samples taken after fasting for at least 8 hours.   BUN 26 (H) 8 - 23 mg/dL   Creatinine, Ser 1.13 (H) 0.44 - 1.00 mg/dL   Calcium 9.6 8.9 - 10.3 mg/dL   GFR calc non Af Amer 44 (L) >60 mL/min   GFR calc Af Amer 51 (L) >60 mL/min   Anion gap 12 5 - 15    Comment: Performed at [facility], [address] [place], [place] [address]  Hepatic function panel     Status: None   Collection Time: 04/02/20  9:44 PM  Result Value Ref Range   Total Protein 7.5 6.5 - 8.1 g/dL   Albumin 4.3 3.5 - 5.0 g/dL   AST 27 15 - 41 U/L   ALT 20 0 - 44 U/L   Alkaline Phosphatase 91 38 - 126 U/L   Total Bilirubin 0.9 0.3 - 1.2 mg/dL   Bilirubin, Direct 0.1 0.0 - 0.2 mg/dL   Indirect Bilirubin 0.8 0.3 - 0.9 mg/dL    Comment: Performed at [facility], [address], [place], [place] [address]  Lipase, blood     Status: None   Collection Time: 04/02/20  9:44 PM  Result Value Ref Range   Lipase 25 11 - 51 U/L    Comment: Performed at [facility], [address], [place], [place] [address]  Respiratory Panel by RT PCR (Flu A&B, Covid) - Nasopharyngeal Swab     Status: None   Collection Time: 04/03/20  1:52 AM   Specimen: Nasopharyngeal Swab  Result Value Ref Range   SARS Coronavirus 2 by RT PCR NEGATIVE NEGATIVE    Comment: (NOTE) SARS-CoV-2 target nucleic acids are NOT DETECTED.  The SARS-CoV-2 RNA is generally detectable in upper respiratoy specimens during the acute  phase of infection. The lowest concentration of SARS-CoV-2 viral copies this assay can detect is 131 copies/mL. A negative result does not preclude SARS-Cov-2 infection and should not be used as the sole basis for  treatment or other patient management decisions. A negative result may occur with  improper specimen collection/handling, submission of specimen other than nasopharyngeal swab, presence of viral mutation(s) within the areas targeted by this assay, and inadequate number of viral copies (<131 copies/mL). A negative result must be combined with clinical observations, patient history, and epidemiological information. The expected result is Negative.  Fact Sheet for Patients:  PinkCheek.be  Fact Sheet for Healthcare Providers:  GravelBags.it  This test is no t yet approved or cleared by the [place] FDA and  has been authorized for detection and/or diagnosis of SARS-CoV-2 by FDA under an Emergency Use Authorization (EUA). This EUA will remain  in effect (meaning this test can be used) for the duration of the COVID-19 declaration under Section 564(b)(1) of the Act, 21 U.S.C. section 360bbb-3(b)(1), unless the authorization is terminated or revoked sooner.     Influenza A by PCR NEGATIVE NEGATIVE   Influenza B by PCR NEGATIVE NEGATIVE    Comment: (NOTE) The Xpert Xpress SARS-CoV-2/FLU/RSV assay is intended as an aid in  the diagnosis of influenza from Nasopharyngeal swab specimens and  should not be used as a sole basis for treatment. Nasal washings and  aspirates are unacceptable for Xpert Xpress SARS-CoV-2/FLU/RSV  testing.  Fact Sheet for Patients: PinkCheek.be  Fact Sheet for Healthcare Providers: GravelBags.it  This test is not yet approved or cleared by the [place] FDA and  has been authorized for detection and/or diagnosis of SARS-CoV-2 by   FDA under an Emergency Use Authorization (EUA). This EUA will remain  in effect (meaning this test can be used) for the duration of the  Covid-19 declaration under Section 564(b)(1) of the Act, 21  U.S.C. section 360bbb-3(b)(1), unless the authorization is  terminated or revoked. Performed at [facility], [address] [place], [place] [address]   Comprehensive metabolic panel     Status: Abnormal   Collection Time: 04/03/20  6:29 AM  Result Value Ref Range   Sodium 136 135 - 145 mmol/L   Potassium 4.4 3.5 - 5.1 mmol/L   Chloride 97 (L) 98 - 111 mmol/L   CO2 28 22 - 32 mmol/L   Glucose, Bld 149 (H) 70 - 99 mg/dL    Comment: Glucose reference range applies only to samples taken after fasting for at least 8 hours.   BUN 22 8 - 23 mg/dL   Creatinine, Ser 1.01 (H) 0.44 - 1.00 mg/dL   Calcium 9.0 8.9 - 10.3 mg/dL   Total Protein 6.7 6.5 - 8.1 g/dL   Albumin 3.6 3.5 - 5.0 g/dL   AST 23 15 - 41 U/L   ALT 15 0 - 44 U/L   Alkaline Phosphatase 76 38 - 126 U/L   Total Bilirubin 1.1 0.3 - 1.2 mg/dL   GFR calc non Af Amer 50 (L) >60 mL/min   GFR calc Af Amer 58 (L) >60 mL/min   Anion gap 11 5 - 15    Comment: Performed at [facility], [address], [place], [place] [address]  Magnesium     Status: None   Collection Time: 04/03/20  6:29 AM  Result Value Ref Range   Magnesium 1.8 1.7 - 2.4 mg/dL    Comment: Performed at [facility], [address] [place], [place] [address]  CBC WITH DIFFERENTIAL     Status: Abnormal   Collection Time: 04/03/20  6:29 AM  Result Value Ref Range   WBC 10.7 (H) 4.0 - 10.5 K/uL  RBC 4.39 3.87 - 5.11 MIL/uL   Hemoglobin 12.3 12.0 - 15.0 g/dL   HCT 38.2 36 - 46 %   MCV 87.0 80.0 - 100.0 fL   MCH 28.0 26.0 - 34.0 pg   MCHC 32.2 30.0 - 36.0 g/dL   RDW 13.6 11.5 - 15.5 %   Platelets 356 150 - 400 K/uL   nRBC 0.0 0.0 - 0.2 %   Neutrophils Relative % 73 %   Neutro Abs 7.8 (H) 1.7 - 7.7 K/uL   Lymphocytes Relative 16 %   Lymphs Abs 1.7 0.7 - 4.0 K/uL    Monocytes Relative 9 %   Monocytes Absolute 1.0 0 - 1 K/uL   Eosinophils Relative 2 %   Eosinophils Absolute 0.2 0 - 0 K/uL   Basophils Relative 0 %   Basophils Absolute 0.0 0 - 0 K/uL   Immature Granulocytes 0 %   Abs Immature Granulocytes 0.04 0.00 - 0.07 K/uL    Comment: Performed at [facility], [address], [place], [place] [address]  Hemoglobin A1c     Status: Abnormal   Collection Time: 04/03/20  6:29 AM  Result Value Ref Range   Hgb A1c MFr Bld 6.9 (H) 4.8 - 5.6 %    Comment: (NOTE) Pre diabetes:          5.7%-6.4%  Diabetes:              >6.4%  Glycemic control for   <7.0% adults with diabetes    Mean Plasma Glucose 151.33 mg/dL    Comment: Performed at [facility], [place] [address] [place], [place] [address]  CBG monitoring, ED     Status: Abnormal   Collection Time: 04/03/20  8:01 AM  Result Value Ref Range   Glucose-Capillary 143 (H) 70 - 99 mg/dL    Comment: Glucose reference range applies only to samples taken after fasting for at least 8 hours.  CBG monitoring, ED     Status: Abnormal   Collection Time: 04/03/20 11:37 AM  Result Value Ref Range   Glucose-Capillary 115 (H) 70 - 99 mg/dL    Comment: Glucose reference range applies only to samples taken after fasting for at least 8 hours.   CT ABDOMEN PELVIS W CONTRAST  Addendum Date: 04/02/2020   ADDENDUM REPORT: 04/02/2020 23:29 ADDENDUM: Results were discussed with Dr. Sabra Heck at 11:22 p.m. [place] on April 02, 2020. Electronically Signed   By: Virgina Norfolk M.D.   On: 04/02/2020 23:29   Result Date: 04/02/2020 CLINICAL DATA:  Abdominal distension. EXAM: CT ABDOMEN AND PELVIS WITH CONTRAST TECHNIQUE: Multidetector CT imaging of the abdomen and pelvis was performed using the standard protocol following bolus administration of intravenous contrast. CONTRAST:  37mL OMNIPAQUE IOHEXOL 300 MG/ML  SOLN COMPARISON:  August 14, 2018 FINDINGS: Lower chest: No acute abnormality. Hepatobiliary: No focal  liver abnormality is seen. A very small amount of posterior perihepatic fluid is seen. Numerous subcentimeter gallstones are seen within the gallbladder lumen without evidence of gallbladder wall thickening or biliary dilatation. Pancreas: Unremarkable. No pancreatic ductal dilatation or surrounding inflammatory changes. Spleen: Normal in size without focal abnormality. Adrenals/Urinary Tract: Adrenal glands are unremarkable. Kidneys are normal, without renal calculi, focal lesion, or hydronephrosis. Very mild diffuse urinary bladder wall thickening is seen. Stomach/Bowel: There is a small hiatal hernia. The appendix is not clearly identified. Multiple dilated small bowel loops are seen within the abdomen and pelvis (maximum small bowel diameter of approximately 3.1 cm). A transition zone  is seen within the left lower quadrant. Vascular/Lymphatic: There is marked severity calcification of the abdominal aorta and bilateral common iliac arteries, without evidence of aneurysmal dilatation. No enlarged abdominal or pelvic lymph nodes. Reproductive: Status post hysterectomy. No adnexal masses. Other: Radiopaque surgical coils are seen along the anterior aspect of the abdomen and pelvis. A small amount of posterior pelvic free fluid is noted. Musculoskeletal: Multilevel degenerative changes seen throughout the lumbar spine IMPRESSION: 1. Partial small bowel obstruction. 2. Cholelithiasis. 3. Small hiatal hernia. 4. Very small amount of ascites. 5. Aortic atherosclerosis. Aortic Atherosclerosis (ICD10-I70.0). Electronically Signed: By: Virgina Norfolk M.D. On: 04/02/2020 23:24    Pending Labs Unresulted Labs (From admission, onward)          Start     Ordered   04/02/20 2311  Urine Culture  Add-on,   AD        04/02/20 2310          Vitals/Pain Today's Vitals   04/03/20 0500 04/03/20 1427 04/03/20 1427 04/03/20 1430  BP: (!) 132/46   (!) 113/98  Pulse: 85   77  Resp:    17  Temp:    98.7 F (37.1 C)   TempSrc:    Oral  SpO2: 90%   94%  Weight:      Height:      PainSc:  Asleep 0-No pain 0-No pain    Isolation Precautions No active isolations  Medications Medications  0.9 %  sodium chloride infusion (0 mLs Intravenous Stopped 04/03/20 0150)  cefTRIAXone (ROCEPHIN) 1 g in sodium chloride 0.9 % 100 mL IVPB (has no administration in time range)  morphine 4 MG/ML injection 4 mg (has no administration in time range)  heparin injection 5,000 Units (5,000 Units Subcutaneous Given 04/03/20 1349)  hydrALAZINE (APRESOLINE) injection 10 mg (has no administration in time range)  LORazepam (ATIVAN) injection 0.5 mg (0.5 mg Intravenous Given 04/03/20 0929)  0.9 %  sodium chloride infusion ( Intravenous New Bag/Given 04/03/20 0904)  acetaminophen (TYLENOL) tablet 650 mg (has no administration in time range)    Or  acetaminophen (TYLENOL) suppository 650 mg (has no administration in time range)  ondansetron (ZOFRAN) tablet 4 mg (has no administration in time range)    Or  ondansetron (ZOFRAN) injection 4 mg (has no administration in time range)  insulin aspart (novoLOG) injection 0-15 Units (0 Units Subcutaneous Not Given 04/03/20 1137)  ondansetron (ZOFRAN) injection 4 mg (4 mg Intravenous Given 04/02/20 2235)  morphine 4 MG/ML injection 2 mg (2 mg Intravenous Given 04/02/20 2238)  iohexol (OMNIPAQUE) 300 MG/ML solution 80 mL (80 mLs Intravenous Contrast Given 04/02/20 2300)  cefTRIAXone (ROCEPHIN) 1 g in sodium chloride 0.9 % 100 mL IVPB (0 g Intravenous Stopped 04/03/20 0929)    Mobility walks Low fall risk   Focused Assessments    R Recommendations: See Admitting Provider Note  Report given to:   Additional Notes:

## 2020-04-03 NOTE — Progress Notes (Signed)
Subjective: Patient admitted this morning, see detailed H&P by Dr Nori Riis- Orlin Hilding 86 year old female with a history of hemicolectomy secondary to diverticulitis, hypertension, hyperlipidemia, diabetes mellitus type 2 came with chief complaint of emesis.  CT abdomen/pelvis showed partial SBO, cholelithiasis, small hiatal hernia, small amount of ascites and aortic atherosclerosis. UA suspicious for UTI Vitals:   04/03/20 1430 04/03/20 1532  BP: (!) 113/98 (!) 144/43  Pulse: 77 74  Resp: 17 18  Temp: 98.7 F (37.1 C) 98.7 F (37.1 C)  SpO2: 94% 97%      A/P Partial small bowel obstruction-continue NG tube, n.p.o.  IV normal saline at 100 ml per hour  UTI-patient has a normal UA, started on IV Rocephin for UTI.  Follow urine culture.  Diabetes mellitus type 2-continue sliding scale insulin with NovoLog.  Hypertension-continue as needed hydralazine.  Leukocytosis-resolved, WBC is down to 10.7 this morning.      [place] [facility] [phone]

## 2020-04-03 NOTE — ED Notes (Signed)
Report to Audrea Muscat, RN 300

## 2020-04-04 ENCOUNTER — Inpatient Hospital Stay (HOSPITAL_COMMUNITY): Payer: Medicare Other

## 2020-04-04 ENCOUNTER — Ambulatory Visit: Payer: Medicare Other | Admitting: Family Medicine

## 2020-04-04 DIAGNOSIS — K56609 Unspecified intestinal obstruction, unspecified as to partial versus complete obstruction: Secondary | ICD-10-CM | POA: Diagnosis not present

## 2020-04-04 DIAGNOSIS — N3000 Acute cystitis without hematuria: Secondary | ICD-10-CM | POA: Diagnosis not present

## 2020-04-04 LAB — CBC
HCT: 37.2 % (ref 36.0–46.0)
Hemoglobin: 11.9 g/dL — ABNORMAL LOW (ref 12.0–15.0)
MCH: 28.6 pg (ref 26.0–34.0)
MCHC: 32 g/dL (ref 30.0–36.0)
MCV: 89.4 fL (ref 80.0–100.0)
Platelets: 308 10*3/uL (ref 150–400)
RBC: 4.16 MIL/uL (ref 3.87–5.11)
RDW: 13.5 % (ref 11.5–15.5)
WBC: 7 10*3/uL (ref 4.0–10.5)
nRBC: 0 % (ref 0.0–0.2)

## 2020-04-04 LAB — COMPREHENSIVE METABOLIC PANEL
ALT: 16 U/L (ref 0–44)
AST: 21 U/L (ref 15–41)
Albumin: 3.7 g/dL (ref 3.5–5.0)
Alkaline Phosphatase: 70 U/L (ref 38–126)
Anion gap: 9 (ref 5–15)
BUN: 16 mg/dL (ref 8–23)
CO2: 29 mmol/L (ref 22–32)
Calcium: 9.2 mg/dL (ref 8.9–10.3)
Chloride: 100 mmol/L (ref 98–111)
Creatinine, Ser: 0.93 mg/dL (ref 0.44–1.00)
GFR calc Af Amer: >60 mL/min (ref >60)
GFR calc non Af Amer: 56 mL/min — ABNORMAL LOW (ref >60)
Glucose, Bld: 120 mg/dL — ABNORMAL HIGH (ref 70–99)
Potassium: 3.8 mmol/L (ref 3.5–5.1)
Sodium: 138 mmol/L (ref 135–145)
Total Bilirubin: 1.1 mg/dL (ref 0.3–1.2)
Total Protein: 6.2 g/dL — ABNORMAL LOW (ref 6.5–8.1)

## 2020-04-04 LAB — GLUCOSE, CAPILLARY
Glucose-Capillary: 117 mg/dL — ABNORMAL HIGH (ref 70–99)
Glucose-Capillary: 109 mg/dL — ABNORMAL HIGH (ref 70–99)
Glucose-Capillary: 85 mg/dL (ref 70–99)
Glucose-Capillary: 98 mg/dL (ref 70–99)

## 2020-04-04 NOTE — Progress Notes (Signed)
[facility]  PROGRESS NOTE  [name] SWH:[id number] DOB: [birth year] DOA: 04/02/2020 PCP: Kathyrn Drown, MD   Brief HPI:   86 year old female with a history of hemicolectomy secondary to diverticulitis, hypertension, hyperlipidemia, diabetes mellitus type 2 came with chief complaint of emesis.  CT abdomen/pelvis showed partial SBO, cholelithiasis, small hiatal hernia, small amount of ascites and aortic atherosclerosis. UA suspicious for UTI    Subjective   Patient seen and examined, denies abdominal pain.  Has been passing flatus.  NG tube in place, very minimal output overnight.   Assessment/Plan:     1. Partial small bowel obstruction-resolved, NG tube was placed yesterday.  Abdominal x-ray obtained today shows normal bowel gas pattern.  Patient is passing flatus.  We will clamp NG tube, and remove it if patient has no nausea or vomiting.  Will start clear liquid diet tonight. 2. UTI-patient has abnormal UA, she was started on IV Rocephin.  Urine culture result is currently pending. 3. Diabetes mellitus type 2-continue sliding scale insulin NovoLog. 4. Hypertension-blood pressure is mildly weighted, continue as needed hydralazine.  Will start home medications tonight if patient able to tolerate p.o. diet. 5. Leukocytosis-patient presented with WBC 22,000, it has improved to 7000.  Patient was started on IV Rocephin for UTI as above.  She continues to be afebrile.     COVID-19 Labs  No results for input(s): DDIMER, FERRITIN, LDH, CRP in the last 72 hours.  Lab Results  Component Value Date   Amo NEGATIVE 04/03/2020     Scheduled medications:   . heparin  5,000 Units Subcutaneous Q8H  . insulin aspart  0-15 Units Subcutaneous TID WC         CBG: Recent Labs  Lab 04/03/20 1137 04/03/20 1625 04/03/20 2122 04/04/20 0801 04/04/20 1151  GLUCAP 115* 119* 117* 117* 109*    SpO2: 99 %    CBC: Recent Labs  Lab 04/02/20 2144 04/03/20 0629  04/04/20 0615  WBC 22.0* 10.7* 7.0  NEUTROABS  --  7.8*  --   HGB 13.1 12.3 11.9*  HCT 39.2 38.2 37.2  MCV 86.7 87.0 89.4  PLT 401* 356 665    Basic Metabolic Panel: Recent Labs  Lab 04/02/20 2144 04/03/20 0629 04/04/20 0615  NA 132* 136 138  K 3.7 4.4 3.8  CL 95* 97* 100  CO2 25 28 29   GLUCOSE 170* 149* 120*  BUN 26* 22 16  CREATININE 1.13* 1.01* 0.93  CALCIUM 9.6 9.0 9.2  MG  --  1.8  --      Liver Function Tests: Recent Labs  Lab 04/02/20 2144 04/03/20 0629 04/04/20 0615  AST 27 23 21   ALT 20 15 16   ALKPHOS 91 76 70  BILITOT 0.9 1.1 1.1  PROT 7.5 6.7 6.2*  ALBUMIN 4.3 3.6 3.7     Antibiotics: Anti-infectives (From admission, onward)   Start     Dose/Rate Route Frequency Ordered Stop   04/03/20 2300  cefTRIAXone (ROCEPHIN) 1 g in sodium chloride 0.9 % 100 mL IVPB        1 g 200 mL/hr over 30 Minutes Intravenous Every 24 hours 04/03/20 0141     04/02/20 2315  cefTRIAXone (ROCEPHIN) 1 g in sodium chloride 0.9 % 100 mL IVPB        1 g 200 mL/hr over 30 Minutes Intravenous  Once 04/02/20 2310 04/03/20 0929       DVT prophylaxis: Heparin  Code Status: Full code  Family Communication: No family at  bedside    Status is: Inpatient  Dispo: The patient is from: Home              Anticipated d/c is to: Home              Anticipated d/c date is: 04/05/2020              Patient currently not medically stable for discharge  Barrier to discharge-ongoing treatment for small bowel obstruction          Consultants:    Procedures:     Objective   Vitals:   04/03/20 1955 04/04/20 0000 04/04/20 0400 04/04/20 1333  BP: (!) 145/55 (!) 135/45 (!) 135/51 (!) 178/55  Pulse: 73 62 65 70  Resp: 16 18 17 16   Temp: 98.7 F (37.1 C) 98.7 F (37.1 C) 98.2 F (36.8 C) 98.1 F (36.7 C)  TempSrc:   Oral Oral  SpO2: 98% 93% 92% 99%  Weight:      Height:        Intake/Output Summary (Last 24 hours) at 04/04/2020 1342 Last data filed at 04/04/2020  2595 Gross per 24 hour  Intake 1000 ml  Output 400 ml  Net 600 ml    10/03 1901 - 10/05 0700 In: 1720 [I.V.:1620] Out: 800   Filed Weights   04/02/20 2008 04/03/20 1544  Weight: 75.3 kg 75.3 kg    Physical Examination:    General: Appears in no acute distress  Cardiovascular: S1-S2, regular, no murmur auscultated  Respiratory: Clear to auscultation bilaterally  Abdomen: Abdomen is soft, nontender, no organomegaly, bowel sounds positive in all 4 quadrants  Extremities: No edema in the lower extremities  Neurologic: Cranial nerves II through XII grossly intact, no focal deficit noted    Data Reviewed:   Recent Results (from the past 240 hour(s))  Respiratory Panel by RT PCR (Flu A&B, Covid) - Nasopharyngeal Swab     Status: None   Collection Time: 04/03/20  1:52 AM   Specimen: Nasopharyngeal Swab  Result Value Ref Range Status   SARS Coronavirus 2 by RT PCR NEGATIVE NEGATIVE Final    Comment: (NOTE) SARS-CoV-2 target nucleic acids are NOT DETECTED.  The SARS-CoV-2 RNA is generally detectable in upper respiratoy specimens during the acute phase of infection. The lowest concentration of SARS-CoV-2 viral copies this assay can detect is 131 copies/mL. A negative result does not preclude SARS-Cov-2 infection and should not be used as the sole basis for treatment or other patient management decisions. A negative result may occur with  improper specimen collection/handling, submission of specimen other than nasopharyngeal swab, presence of viral mutation(s) within the areas targeted by this assay, and inadequate number of viral copies (<131 copies/mL). A negative result must be combined with clinical observations, patient history, and epidemiological information. The expected result is Negative.  Fact Sheet for Patients:  PinkCheek.be  Fact Sheet for Healthcare Providers:  GravelBags.it  This test is no t  yet approved or cleared by the [place] FDA and  has been authorized for detection and/or diagnosis of SARS-CoV-2 by FDA under an Emergency Use Authorization (EUA). This EUA will remain  in effect (meaning this test can be used) for the duration of the COVID-19 declaration under Section 564(b)(1) of the Act, 21 U.S.C. section 360bbb-3(b)(1), unless the authorization is terminated or revoked sooner.     Influenza A by PCR NEGATIVE NEGATIVE Final   Influenza B by PCR NEGATIVE NEGATIVE Final    Comment: (NOTE) The Xpert  Xpress SARS-CoV-2/FLU/RSV assay is intended as an aid in  the diagnosis of influenza from Nasopharyngeal swab specimens and  should not be used as a sole basis for treatment. Nasal washings and  aspirates are unacceptable for Xpert Xpress SARS-CoV-2/FLU/RSV  testing.  Fact Sheet for Patients: PinkCheek.be  Fact Sheet for Healthcare Providers: GravelBags.it  This test is not yet approved or cleared by the [place] FDA and  has been authorized for detection and/or diagnosis of SARS-CoV-2 by  FDA under an Emergency Use Authorization (EUA). This EUA will remain  in effect (meaning this test can be used) for the duration of the  Covid-19 declaration under Section 564(b)(1) of the Act, 21  U.S.C. section 360bbb-3(b)(1), unless the authorization is  terminated or revoked. Performed at [facility], [address] [place], [place] [address]     Recent Labs  Lab 04/02/20 2144  LIPASE 25    Studies:  CT ABDOMEN PELVIS W CONTRAST  Addendum Date: 04/02/2020   ADDENDUM REPORT: 04/02/2020 23:29 ADDENDUM: Results were discussed with Dr. Sabra Heck at 11:22 p.m. [place] on April 02, 2020. Electronically Signed   By: Virgina Norfolk M.D.   On: 04/02/2020 23:29   Result Date: 04/02/2020 CLINICAL DATA:  Abdominal distension. EXAM: CT ABDOMEN AND PELVIS WITH CONTRAST TECHNIQUE: Multidetector CT imaging of the  abdomen and pelvis was performed using the standard protocol following bolus administration of intravenous contrast. CONTRAST:  29mL OMNIPAQUE IOHEXOL 300 MG/ML  SOLN COMPARISON:  August 14, 2018 FINDINGS: Lower chest: No acute abnormality. Hepatobiliary: No focal liver abnormality is seen. A very small amount of posterior perihepatic fluid is seen. Numerous subcentimeter gallstones are seen within the gallbladder lumen without evidence of gallbladder wall thickening or biliary dilatation. Pancreas: Unremarkable. No pancreatic ductal dilatation or surrounding inflammatory changes. Spleen: Normal in size without focal abnormality. Adrenals/Urinary Tract: Adrenal glands are unremarkable. Kidneys are normal, without renal calculi, focal lesion, or hydronephrosis. Very mild diffuse urinary bladder wall thickening is seen. Stomach/Bowel: There is a small hiatal hernia. The appendix is not clearly identified. Multiple dilated small bowel loops are seen within the abdomen and pelvis (maximum small bowel diameter of approximately 3.1 cm). A transition zone is seen within the left lower quadrant. Vascular/Lymphatic: There is marked severity calcification of the abdominal aorta and bilateral common iliac arteries, without evidence of aneurysmal dilatation. No enlarged abdominal or pelvic lymph nodes. Reproductive: Status post hysterectomy. No adnexal masses. Other: Radiopaque surgical coils are seen along the anterior aspect of the abdomen and pelvis. A small amount of posterior pelvic free fluid is noted. Musculoskeletal: Multilevel degenerative changes seen throughout the lumbar spine IMPRESSION: 1. Partial small bowel obstruction. 2. Cholelithiasis. 3. Small hiatal hernia. 4. Very small amount of ascites. 5. Aortic atherosclerosis. Aortic Atherosclerosis (ICD10-I70.0). Electronically Signed: By: Virgina Norfolk M.D. On: 04/02/2020 23:24   DG Abd 2 Views  Result Date: 04/04/2020 CLINICAL DATA:  Small-bowel  obstruction EXAM: ABDOMEN - 2 VIEW COMPARISON:  04/02/2020 FINDINGS: Orogastric or nasogastric tube tip only in the distal esophagus just at the diaphragm level. Bowel gas pattern within normal limits presently without dilated small or large bowel. No free air. IMPRESSION: Orogastric or nasogastric tube tip only in the distal esophagus just at the diaphragm level. Normal bowel gas pattern presently. No sign of obstruction or free air. Electronically Signed   By: Nelson Chimes M.D.   On: 04/04/2020 11:22       Oswald Hillock   [facility] If 7PM-7AM, please contact night-coverage  at [email or web address], Office  [phone]   04/04/2020, 1:42 PM  LOS: 2 days

## 2020-04-04 NOTE — Progress Notes (Signed)
NG tube removed, clear liquids given. Patient tolerated without abd pain or n/v. Advised patient to sip liquids slowly, verbalized understanding. Will continue to monitor.

## 2020-04-04 NOTE — TOC Initial Note (Signed)
Transition of Care [facility]) - Initial/Assessment Note    Patient Details  Name: [name] MRN: [id number] Date of Birth: July 27, 1931  [facility]) CM/SW Contact:    Salome Arnt, [place] Phone Number: 04/04/2020, 8:03 AM  Clinical Narrative:   Pt admitted due to SBO. She has been a resident at [facility] since August 2020. LCSW completed assessment with pt. She indicates she plans to return to [facility] when medically stable. Pt has a son, daughter-in-law, and 2 grandchildren local. Per Sunday Spillers at facility, pt requires limited assist with bathing, but is otherwise fairly independent. She does not use any DME and is not currently active with home health services. Okay to return at d/c. TOC will continue to follow.                 Expected Discharge Plan: Assisted Living Barriers to Discharge: Continued Medical Work up   Patient Goals and CMS Choice Patient states their goals for this hospitalization and ongoing recovery are:: return to [facility]      Expected Discharge Plan and Services Expected Discharge Plan: Assisted Living In-house Referral: Clinical Social Work     Living arrangements for the past 2 months: [place]                                      Prior Living Arrangements/Services Living arrangements for the past 2 months: [place] Lives with:: Facility Resident Patient language and need for interpreter reviewed:: Yes Do you feel safe going back to the place where you live?: Yes      Need for Family Participation in Patient Care: No (Comment) Care giver support system in place?: Yes (comment) (ALF resident)   Criminal Activity/Legal Involvement Pertinent to Current Situation/Hospitalization: No - Comment as needed  Activities of Daily Living Home Assistive Devices/Equipment: CBG Meter ADL Screening (condition at time of admission) Patient's cognitive ability adequate to safely complete daily  activities?: Yes Is the patient deaf or have difficulty hearing?: No Does the patient have difficulty seeing, even when wearing glasses/contacts?: No Does the patient have difficulty concentrating, remembering, or making decisions?: No Patient able to express need for assistance with ADLs?: Yes Does the patient have difficulty dressing or bathing?: No Independently performs ADLs?: Yes (appropriate for developmental age) Does the patient have difficulty walking or climbing stairs?: Yes Weakness of Legs: Right Weakness of Arms/Hands: None  Permission Sought/Granted Permission sought to share information with : Facility Art therapist granted to share information with : Yes, Verbal Permission Granted     Permission granted to share info w AGENCY: [facility]  Permission granted to share info w Relationship: ALF     Emotional Assessment Appearance:: Appears stated age Attitude/Demeanor/Rapport: Engaged Affect (typically observed): Accepting Orientation: : Oriented to Self, Oriented to Place, Oriented to  Time, Oriented to Situation Alcohol / Substance Use: Not Applicable Psych Involvement: No (comment)  Admission diagnosis:  SBO (small bowel obstruction) ([place]) [K56.609] Acute cystitis without hematuria [N30.00] Patient Active Problem List   Diagnosis Date Noted  . Myalgia due to statin 02/15/2020  . Generalized anxiety disorder 06/05/2018  . Vomiting and diarrhea 03/08/2018  . Dehydration 03/08/2018  . Hyponatremia 03/08/2018  . C. difficile diarrhea 03/03/2018  . Acute lower UTI 03/03/2018  . Hyperlipidemia associated with type 2 diabetes mellitus ([place]) 10/02/2016  . Pedal edema 10/02/2016  . Insomnia 10/02/2016  . SBO (  small bowel obstruction) ([place]) 07/02/2016  . Dyspnea on exertion 02/24/2016  . Other fatigue   . E-coli UTI 06/18/2015  . Partial small bowel obstruction ([place]) 06/15/2015  . Ileitis, regional, with intestinal obstruction 06/15/2015  .  Leukocytosis 12/16/2014  . Nausea and vomiting 12/15/2014  . HLD (hyperlipidemia) 12/15/2014  . Essential hypertension 12/15/2014  . Controlled type 2 diabetes mellitus without complication, without long-term current use of insulin ([place]) 02/15/2011   PCP:  Kathyrn Drown, MD Pharmacy:   [facility], [place] [address] [place] [address] Phone: [phone] Fax: [phone]     Social Determinants of Health (SDOH) Interventions    Readmission Risk Interventions No flowsheet data found.

## 2020-04-04 NOTE — Progress Notes (Signed)
Patient has had no output from NG tube.  Placement verified.  Patient has had no complaints of nausea and been passin gas (per patient). No complaints of nausea during shift.

## 2020-04-05 DIAGNOSIS — K56609 Unspecified intestinal obstruction, unspecified as to partial versus complete obstruction: Secondary | ICD-10-CM | POA: Diagnosis not present

## 2020-04-05 LAB — GLUCOSE, CAPILLARY
Glucose-Capillary: 92 mg/dL (ref 70–99)
Glucose-Capillary: 112 mg/dL — ABNORMAL HIGH (ref 70–99)
Glucose-Capillary: 108 mg/dL — ABNORMAL HIGH (ref 70–99)
Glucose-Capillary: 111 mg/dL — ABNORMAL HIGH (ref 70–99)

## 2020-04-05 LAB — BASIC METABOLIC PANEL
Anion gap: 8 (ref 5–15)
BUN: 10 mg/dL (ref 8–23)
CO2: 27 mmol/L (ref 22–32)
Calcium: 8.6 mg/dL — ABNORMAL LOW (ref 8.9–10.3)
Chloride: 102 mmol/L (ref 98–111)
Creatinine, Ser: 0.74 mg/dL (ref 0.44–1.00)
GFR calc non Af Amer: >60 mL/min (ref >60)
Glucose, Bld: 105 mg/dL — ABNORMAL HIGH (ref 70–99)
Potassium: 3.5 mmol/L (ref 3.5–5.1)
Sodium: 137 mmol/L (ref 135–145)

## 2020-04-05 NOTE — Progress Notes (Addendum)
PROGRESS NOTE    [name]  HCW:[id number] DOB: [birth year] DOA: 04/02/2020 PCP: Kathyrn Drown, MD   Brief Narrative:  86 year old female with history of hemicolectomy secondary to diverticulitis, hypertension, hyperlipidemia, diabetes mellitus type 2 came with chief complaint of emesis. CT abdomen/pelvis showed partial SBO, cholelithiasis, small hiatal hernia, small amount of ascites and aortic atherosclerosis. UA suspicious for UTI and started on antibiotics.  She has made significant improvement, abdominal pain has improved able to pass flatus not having had a bowel movement yet.  Assessment & Plan:   Active Problems:   SBO (small bowel obstruction) ([place])   1. Partial small bowel obstruction-resolved, NG tube was removed yesterday.  Abdominal x-ray obtained 10/5 shows normal bowel gas pattern.  Patient is passing flatus.  she tolerated clear liquid diet, will advance diet to full liquid to soft blend.  2. UTI-patient has abnormal UA, She was started on IV Rocephin.  Urine culture grew gram Neg, sensitivity pending.  3. Diabetes mellitus type 2-continue sliding scale insulin NovoLog.  4. Hypertension-blood pressure is mildly elevated, continue as needed hydralazine. Resume home medications as she is able to tolerate PO.  5. Leukocytosis-patient presented with WBC 22,000, it has improved to 7000.  Patient was started on IV Rocephin for UTI as above.  She continues to be afebrile.    DVT prophylaxis: Heparin sq  Code Status: Full Family Communication:  No one at bed side. Disposition Plan:  Status is: Inpatient  Remains inpatient appropriate because:Inpatient level of care appropriate due to severity of illness   Dispo: The patient is from: Home              Anticipated d/c is to: Home              Anticipated d/c date is: 1 day              Patient currently is not medically stable to d/c.  Barrier to discharge-ongoing treatment for small bowel obstruction/  UTI   Consultants:   None.  Procedures: None.  Antimicrobials:   Anti-infectives (From admission, onward)   Start     Dose/Rate Route Frequency Ordered Stop   04/03/20 2300  cefTRIAXone (ROCEPHIN) 1 g in sodium chloride 0.9 % 100 mL IVPB        1 g 200 mL/hr over 30 Minutes Intravenous Every 24 hours 04/03/20 0141     04/02/20 2315  cefTRIAXone (ROCEPHIN) 1 g in sodium chloride 0.9 % 100 mL IVPB        1 g 200 mL/hr over 30 Minutes Intravenous  Once 04/02/20 2310 04/03/20 0929      Subjective: Patient was seen and examined at bedside.  Overnight events noted.  Patient reports feeling better,  abdominal pain has improved. She is able to pass flatus, has not had any bowel movement yet. NG tube was removed yesterday.  she is tolerating clear liquid diet without any nausea and vomiting.  Objective: Vitals:   04/04/20 1333 04/04/20 2010 04/04/20 2108 04/05/20 0706  BP: (!) 178/55  (!) 158/46 (!) 155/56  Pulse: 70  (!) 58 65  Resp: 16  18   Temp: 98.1 F (36.7 C)  98.3 F (36.8 C) 98.1 F (36.7 C)  TempSrc: Oral  Oral Oral  SpO2: 99% 95% 100% 96%  Weight:      Height:        Intake/Output Summary (Last 24 hours) at 04/05/2020 1326 Last data filed at 04/05/2020 0936 Gross per 24  hour  Intake 960 ml  Output 1550 ml  Net -590 ml   Filed Weights   04/02/20 2008 04/03/20 1544  Weight: 75.3 kg 75.3 kg    Examination:  General exam: Appears calm and comfortable  Respiratory system: Clear to auscultation. Respiratory effort normal. Cardiovascular system: S1 & S2 heard, RRR. No JVD, murmurs, rubs, gallops or clicks. No pedal edema. Gastrointestinal system: Abdomen is nondistended, soft and nontender. No organomegaly or masses felt. Normal bowel sounds heard. Central nervous system: Alert and oriented. No focal neurological deficits. Extremities:  No edema, no clubbing. No cyanosis. Skin: No rashes, lesions or ulcers Psychiatry: Judgement and insight appear normal. Mood &  affect appropriate.     Data Reviewed: I have personally reviewed following labs and imaging studies  CBC: Recent Labs  Lab 04/02/20 2144 04/03/20 0629 04/04/20 0615  WBC 22.0* 10.7* 7.0  NEUTROABS  --  7.8*  --   HGB 13.1 12.3 11.9*  HCT 39.2 38.2 37.2  MCV 86.7 87.0 89.4  PLT 401* 356 154   Basic Metabolic Panel: Recent Labs  Lab 04/02/20 2144 04/03/20 0629 04/04/20 0615 04/05/20 0548  NA 132* 136 138 137  K 3.7 4.4 3.8 3.5  CL 95* 97* 100 102  CO2 25 28 29 27   GLUCOSE 170* 149* 120* 105*  BUN 26* 22 16 10   CREATININE 1.13* 1.01* 0.93 0.74  CALCIUM 9.6 9.0 9.2 8.6*  MG  --  1.8  --   --    GFR: Estimated Creatinine Clearance: 50.9 mL/min (by C-G formula based on SCr of 0.74 mg/dL). Liver Function Tests: Recent Labs  Lab 04/02/20 2144 04/03/20 0629 04/04/20 0615  AST 27 23 21   ALT 20 15 16   ALKPHOS 91 76 70  BILITOT 0.9 1.1 1.1  PROT 7.5 6.7 6.2*  ALBUMIN 4.3 3.6 3.7   Recent Labs  Lab 04/02/20 2144  LIPASE 25   No results for input(s): AMMONIA in the last 168 hours. Coagulation Profile: No results for input(s): INR, PROTIME in the last 168 hours. Cardiac Enzymes: No results for input(s): CKTOTAL, CKMB, CKMBINDEX, TROPONINI in the last 168 hours. BNP (last 3 results) No results for input(s): PROBNP in the last 8760 hours. HbA1C: Recent Labs    04/03/20 0629  HGBA1C 6.9*   CBG: Recent Labs  Lab 04/04/20 1151 04/04/20 1721 04/04/20 2109 04/05/20 0743 04/05/20 1113  GLUCAP 109* 85 98 92 112*   Lipid Profile: No results for input(s): CHOL, HDL, LDLCALC, TRIG, CHOLHDL, LDLDIRECT in the last 72 hours. Thyroid Function Tests: No results for input(s): TSH, T4TOTAL, FREET4, T3FREE, THYROIDAB in the last 72 hours. Anemia Panel: No results for input(s): VITAMINB12, FOLATE, FERRITIN, TIBC, IRON, RETICCTPCT in the last 72 hours. Sepsis Labs: No results for input(s): PROCALCITON, LATICACIDVEN in the last 168 hours.  Recent Results (from the  past 240 hour(s))  Urine Culture     Status: Abnormal (Preliminary result)   Collection Time: 04/02/20 11:11 PM   Specimen: Urine, Clean Catch  Result Value Ref Range Status   Specimen Description   Final    URINE, CLEAN CATCH Performed at [facility], [address] [place], [place] [address]    Special Requests   Final    NONE Performed at [facility], [address], [place], [place] [address]    Culture (A)  Final    >=100,000 COLONIES/mL PROTEUS MIRABILIS SUSCEPTIBILITIES TO FOLLOW Performed at [facility], [place] [address] [place], [place] [address]  Report Status PENDING  Incomplete  Respiratory Panel by RT PCR (Flu A&B, Covid) - Nasopharyngeal Swab     Status: None   Collection Time: 04/03/20  1:52 AM   Specimen: Nasopharyngeal Swab  Result Value Ref Range Status   SARS Coronavirus 2 by RT PCR NEGATIVE NEGATIVE Final    Comment: (NOTE) SARS-CoV-2 target nucleic acids are NOT DETECTED.  The SARS-CoV-2 RNA is generally detectable in upper respiratoy specimens during the acute phase of infection. The lowest concentration of SARS-CoV-2 viral copies this assay can detect is 131 copies/mL. A negative result does not preclude SARS-Cov-2 infection and should not be used as the sole basis for treatment or other patient management decisions. A negative result may occur with  improper specimen collection/handling, submission of specimen other than nasopharyngeal swab, presence of viral mutation(s) within the areas targeted by this assay, and inadequate number of viral copies (<131 copies/mL). A negative result must be combined with clinical observations, patient history, and epidemiological information. The expected result is Negative.  Fact Sheet for Patients:  PinkCheek.be  Fact Sheet for Healthcare Providers:  GravelBags.it  This test is no t yet approved or cleared by the [place] FDA and  has been  authorized for detection and/or diagnosis of SARS-CoV-2 by FDA under an Emergency Use Authorization (EUA). This EUA will remain  in effect (meaning this test can be used) for the duration of the COVID-19 declaration under Section 564(b)(1) of the Act, 21 U.S.C. section 360bbb-3(b)(1), unless the authorization is terminated or revoked sooner.     Influenza A by PCR NEGATIVE NEGATIVE Final   Influenza B by PCR NEGATIVE NEGATIVE Final    Comment: (NOTE) The Xpert Xpress SARS-CoV-2/FLU/RSV assay is intended as an aid in  the diagnosis of influenza from Nasopharyngeal swab specimens and  should not be used as a sole basis for treatment. Nasal washings and  aspirates are unacceptable for Xpert Xpress SARS-CoV-2/FLU/RSV  testing.  Fact Sheet for Patients: PinkCheek.be  Fact Sheet for Healthcare Providers: GravelBags.it  This test is not yet approved or cleared by the [place] FDA and  has been authorized for detection and/or diagnosis of SARS-CoV-2 by  FDA under an Emergency Use Authorization (EUA). This EUA will remain  in effect (meaning this test can be used) for the duration of the  Covid-19 declaration under Section 564(b)(1) of the Act, 21  U.S.C. section 360bbb-3(b)(1), unless the authorization is  terminated or revoked. Performed at [facility], [address], [place], [place] [address]      Radiology Studies: DG Abd 2 Views  Result Date: 04/04/2020 CLINICAL DATA:  Small-bowel obstruction EXAM: ABDOMEN - 2 VIEW COMPARISON:  04/02/2020 FINDINGS: Orogastric or nasogastric tube tip only in the distal esophagus just at the diaphragm level. Bowel gas pattern within normal limits presently without dilated small or large bowel. No free air. IMPRESSION: Orogastric or nasogastric tube tip only in the distal esophagus just at the diaphragm level. Normal bowel gas pattern presently. No sign of obstruction or free air.  Electronically Signed   By: Nelson Chimes M.D.   On: 04/04/2020 11:22    Scheduled Meds: . heparin  5,000 Units Subcutaneous Q8H  . insulin aspart  0-15 Units Subcutaneous TID WC   Continuous Infusions: . sodium chloride 100 mL/hr at 04/05/20 1141  . cefTRIAXone (ROCEPHIN)  IV 1 g (04/04/20 2206)     LOS: 3 days    Time spent: 25 mins.    Shawna Clamp, MD [facility]  If 7PM-7AM, please contact night-coverage

## 2020-04-06 DIAGNOSIS — K56609 Unspecified intestinal obstruction, unspecified as to partial versus complete obstruction: Secondary | ICD-10-CM | POA: Diagnosis not present

## 2020-04-06 LAB — GLUCOSE, CAPILLARY
Glucose-Capillary: 124 mg/dL — ABNORMAL HIGH (ref 70–99)
Glucose-Capillary: 117 mg/dL — ABNORMAL HIGH (ref 70–99)

## 2020-04-06 LAB — CBC
HCT: 38.8 % (ref 36.0–46.0)
Hemoglobin: 12.3 g/dL (ref 12.0–15.0)
MCH: 28.1 pg (ref 26.0–34.0)
MCHC: 31.7 g/dL (ref 30.0–36.0)
MCV: 88.8 fL (ref 80.0–100.0)
Platelets: 317 10*3/uL (ref 150–400)
RBC: 4.37 MIL/uL (ref 3.87–5.11)
RDW: 13.2 % (ref 11.5–15.5)
WBC: 7.1 10*3/uL (ref 4.0–10.5)
nRBC: 0 % (ref 0.0–0.2)

## 2020-04-06 LAB — BASIC METABOLIC PANEL
Anion gap: 13 (ref 5–15)
BUN: 8 mg/dL (ref 8–23)
CO2: 26 mmol/L (ref 22–32)
Calcium: 9.5 mg/dL (ref 8.9–10.3)
Chloride: 100 mmol/L (ref 98–111)
Creatinine, Ser: 0.82 mg/dL (ref 0.44–1.00)
GFR calc non Af Amer: >60 mL/min (ref >60)
Glucose, Bld: 126 mg/dL — ABNORMAL HIGH (ref 70–99)
Potassium: 3.5 mmol/L (ref 3.5–5.1)
Sodium: 139 mmol/L (ref 135–145)

## 2020-04-06 LAB — URINE CULTURE: Culture: >=100000 — AB

## 2020-04-06 LAB — RESPIRATORY PANEL BY RT PCR (FLU A&B, COVID)
Influenza A by PCR: NEGATIVE
Influenza B by PCR: NEGATIVE
SARS Coronavirus 2 by RT PCR: NEGATIVE

## 2020-04-06 MED ORDER — CEPHALEXIN 500 MG PO CAPS: 500.0000 mg | ORAL_CAPSULE | Freq: Three times a day (TID) | ORAL | Status: DC

## 2020-04-06 MED ORDER — LOSARTAN POTASSIUM 50 MG PO TABS
50.0000 mg | ORAL_TABLET | Freq: Every day | ORAL | Status: DC
  Administered 2020-04-06: 50 mg via ORAL
  Filled 2020-04-06: qty 1

## 2020-04-06 MED ORDER — AMLODIPINE BESYLATE 5 MG PO TABS
10.0000 mg | ORAL_TABLET | Freq: Every day | ORAL | Status: DC
  Administered 2020-04-06: 10 mg via ORAL
  Filled 2020-04-06: qty 2

## 2020-04-06 MED ORDER — HYDRALAZINE HCL 25 MG PO TABS
25.0000 mg | ORAL_TABLET | Freq: Two times a day (BID) | ORAL | Status: DC
  Administered 2020-04-06: 25 mg via ORAL
  Filled 2020-04-06: qty 1

## 2020-04-06 MED ORDER — CEPHALEXIN 500 MG PO CAPS
500.0000 mg | ORAL_CAPSULE | Freq: Three times a day (TID) | ORAL | 0 refills | Status: DC
Start: 2020-04-06 — End: 2020-05-05

## 2020-04-06 NOTE — NC FL2 (Signed)
[place] LEVEL OF CARE SCREENING TOOL     IDENTIFICATION  Patient Name: [name] Birthdate: Oct 29, 1931 Sex: female Admission Date (Current Location): 04/02/2020  [facility] and [place] Number:  [employer] and Address:  [facility] [address], [place]      Provider Number: [id number]  Attending Physician Name and Address:  Shawna Clamp, MD  Relative Name and Phone Number:  Jayli Fogleman (son) Ph: [phone]    Current Level of Care: Hospital Recommended Level of Care: [place] Prior Approval Number:    Date Approved/Denied:   PASRR Number:    Discharge Plan: Other (Comment) (ALF)    Current Diagnoses: Patient Active Problem List   Diagnosis Date Noted  . Myalgia due to statin 02/15/2020  . Generalized anxiety disorder 06/05/2018  . Vomiting and diarrhea 03/08/2018  . Dehydration 03/08/2018  . Hyponatremia 03/08/2018  . C. difficile diarrhea 03/03/2018  . Acute lower UTI 03/03/2018  . Hyperlipidemia associated with type 2 diabetes mellitus ([place]) 10/02/2016  . Pedal edema 10/02/2016  . Insomnia 10/02/2016  . SBO (small bowel obstruction) ([place]) 07/02/2016  . Dyspnea on exertion 02/24/2016  . Other fatigue   . E-coli UTI 06/18/2015  . Partial small bowel obstruction ([place]) 06/15/2015  . Ileitis, regional, with intestinal obstruction 06/15/2015  . Leukocytosis 12/16/2014  . Nausea and vomiting 12/15/2014  . HLD (hyperlipidemia) 12/15/2014  . Essential hypertension 12/15/2014  . Controlled type 2 diabetes mellitus without complication, without long-term current use of insulin ([place]) 02/15/2011    Orientation RESPIRATION BLADDER Height & Weight     Self, Time, Situation, Place  Normal Continent Weight: 166 lb 0.1 oz (75.3 kg) Height:  5\' 8"  (172.7 cm)  BEHAVIORAL SYMPTOMS/MOOD NEUROLOGICAL BOWEL NUTRITION STATUS      Continent    AMBULATORY STATUS COMMUNICATION OF NEEDS Skin   Limited  Assist Verbally Normal                       Personal Care Assistance Level of Assistance  Bathing, Feeding, Dressing Bathing Assistance: Limited assistance Feeding assistance: Independent Dressing Assistance: Limited assistance     Functional Limitations Info  Sight, Hearing, Speech Sight Info: Adequate Hearing Info: Adequate Speech Info: Adequate    SPECIAL CARE FACTORS FREQUENCY  PT (By licensed PT) (HHRN  x5 per week)     PT Frequency: 5x per week              Contractures Contractures Info: Not present    Additional Factors Info  Psychotropic, Code Status, Allergies Code Status Info: Full code Allergies Info: Hydrocortisone, Lisinopril, Phenergan (promethazine Hcl), Statins, Trazodone and Nefazodone, Prednisone Psychotropic Info: Xanax         Current Medications (04/06/2020):  This is the current hospital active medication list Current Facility-Administered Medications  Medication Dose Route Frequency Provider Last Rate Last Admin  . 0.9 %  sodium chloride infusion   Intravenous Continuous Zierle-Ghosh, Asia B, DO 100 mL/hr at 04/05/20 1141 New Bag at 04/05/20 1141  . acetaminophen (TYLENOL) tablet 650 mg  650 mg Oral Q6H PRN Zierle-Ghosh, Asia B, DO       Or  . acetaminophen (TYLENOL) suppository 650 mg  650 mg Rectal Q6H PRN Zierle-Ghosh, Asia B, DO      . amLODipine (NORVASC) tablet 10 mg  10 mg Oral Daily Shawna Clamp, MD   10 mg at 04/06/20 0837  . cephALEXin (KEFLEX) capsule 500 mg  500 mg Oral TID Shawna Clamp, MD      . heparin injection 5,000 Units  5,000 Units Subcutaneous Q8H Zierle-Ghosh, Asia B, DO   5,000 Units at 04/06/20 0610  . hydrALAZINE (APRESOLINE) injection 10 mg  10 mg Intravenous Q8H PRN Zierle-Ghosh, Asia B, DO      . hydrALAZINE (APRESOLINE) tablet 25 mg  25 mg Oral BID Shawna Clamp, MD   25 mg at 04/06/20 7672  . insulin aspart (novoLOG) injection 0-15 Units  0-15 Units Subcutaneous TID WC Zierle-Ghosh, Asia B, DO   2 Units  at 04/06/20 0836  . LORazepam (ATIVAN) injection 0.5 mg  0.5 mg Intravenous Q6H PRN Zierle-Ghosh, Asia B, DO   0.5 mg at 04/06/20 1245  . losartan (COZAAR) tablet 50 mg  50 mg Oral Daily Shawna Clamp, MD   50 mg at 04/06/20 0837  . morphine 4 MG/ML injection 4 mg  4 mg Intravenous Q4H PRN Zierle-Ghosh, Asia B, DO      . ondansetron (ZOFRAN) tablet 4 mg  4 mg Oral Q6H PRN Zierle-Ghosh, Asia B, DO       Or  . ondansetron (ZOFRAN) injection 4 mg  4 mg Intravenous Q6H PRN Zierle-Ghosh, Asia B, DO      . polyvinyl alcohol (LIQUIFILM TEARS) 1.4 % ophthalmic solution 1 drop  1 drop Both Eyes PRN Darrick Meigs, Marge Duncans, MD         Discharge Medications: Medication List    TAKE these medications   Acetaminophen Extra Strength 500 MG tablet Generic drug: acetaminophen TAKE (1) TABLET BY MOUTH EVERY (6) HOURS AS NEEDED FOR PAIN.**DO NOT EXCEED 8 TABLETS IN 24 HOURS** What changed: See the new instructions.   ALPRAZolam 0.25 MG tablet Commonly known as: XANAX TAKE 1 TABLET BY MOUTH TWICE A DAY. What changed:   how much to take  how to take this  when to take this  additional instructions   amLODipine 10 MG tablet Commonly known as: NORVASC Take 1 tablet (10 mg total) by mouth daily.   Anti-Diarrheal 2 MG tablet Generic drug: loperamide TAKE 1 TABLET BY MOUTH 3 TIMES A DAY AS NEEDED FOR DIARRHEA. What changed: See the new instructions.   beta carotene w/minerals tablet TAKE 1 TABLET BY MOUTH AT BEDTIME.   cephALEXin 500 MG capsule Commonly known as: KEFLEX Take 1 capsule (500 mg total) by mouth 3 (three) times daily.   dicyclomine 10 MG capsule Commonly known as: Bentyl 1 bid prn abd cramps and loose stool What changed:   how much to take  how to take this  when to take this  reasons to take this  additional instructions   famotidine 20 MG tablet Commonly known as: PEPCID TAKE 1 TABLET BY MOUTH TWICE DAILY. What changed: when to take this   hydrALAZINE 25 MG  tablet Commonly known as: APRESOLINE TAKE 1 TABLET BY MOUTH TWICE A DAY. What changed: when to take this   hydrochlorothiazide 25 MG tablet Commonly known as: HYDRODIURIL Take 1 tablet (25 mg total) by mouth daily.   hydrOXYzine 25 MG tablet Commonly known as: ATARAX/VISTARIL TAKE 1 TABLET BY MOUTH 3 TIMES A DAY AS NEEDED FOR ITCHING. What changed: See the new instructions.   lactobacillus acidophilus Tabs tablet Take 2 tablets by mouth 3 (three) times daily.   losartan 50 MG tablet Commonly known as: COZAAR Take 1 tablet (50 mg total) by mouth daily.   metFORMIN 500 MG tablet Commonly known as: GLUCOPHAGE Take one tablet 500mg   po in the morning and 1/2 tablet 250 mg po at supper What changed:   how much to take  how to take this  when to take this  additional instructions   pantoprazole 20 MG tablet Commonly known as: PROTONIX TAKE (1) TABLET BY MOUTH ONCE DAILY. What changed:   how much to take  how to take this  when to take this  additional instructions   QC Hemorrhoidal 0.25-14-74.9 % rectal ointment Generic drug: phenylephrine-shark liver oil-mineral oil-petrolatum APPLY TO RECTUM 3 TIMES DAILY AS NEEDED FOR IRRITATION. What changed: See the new instructions.   saccharomyces boulardii 250 MG capsule Commonly known as: Florastor Take 1 capsule (250 mg total) by mouth 2 (two) times daily.   Vitamin B-12 2500 MCG Subl TAKE 1/2 TABLET BY MOUTH AT BEDTIME. What changed: when to take this   Vitamin D (Cholecalciferol) 10 MCG (400 UNIT) Tabs TAKE 1 TABLET BY MOUTH ONCE DAILY.        Relevant Imaging Results:  Relevant Lab Results:   Additional Information SSN: [id number]  Natasha Bence, LCSW

## 2020-04-06 NOTE — Evaluation (Signed)
Physical Therapy Evaluation Patient Details Name: [name] MRN: [id number] DOB: [birth year] Today's Date: 04/06/2020   History of Present Illness  [name]  is a 86 y.o. female, with history of hemicolectomy secondary to diverticulitis, hypertension, hyperlipidemia, diabetes mellitus type 2, and more presents to the ED with a chief complaint of emesis.  Patient reports that she has been throwing up all day.  She reports that she is attempted a dry crackers, Gatorade, apple juice and has thrown it all up.  Emesis occurs within 1 hour after eating, and sometimes between attempts to eat as well.  Last normal meal was sausage and bread at breakfast, which she also threw up.  Her emesis is nonbloody.  She reports she has associated abdominal pain, is not passing flatus, and her stomach felt tight earlier.  She reports her last normal bowel movement being day before yesterday.  Patient normally goes every day.  Today she did have 3 episodes of watery diarrhea, after she got to the hospital.  Patient reports that she has had a history of 2 small bowel obstructions, and this 1 feels like that.  She reports that her abdominal pain is crampy comes in waves, and was improved with morphine.    Clinical Impression  Patient functioning near baseline for functional mobility and gait.  Patient has to lean on nearby objects for support when not using AD, demonstrates good return for using RW without loss of balance during gait training and tolerated sitting up in chair after therapy.  Plan: patient to be discharged home today and discharged from physical therapy to care of nursing for ambulation daily as tolerated for length of stay.    Follow Up Recommendations Home health PT;Supervision for mobility/OOB;Supervision - Intermittent    Equipment Recommendations  None recommended by PT    Recommendations for Other Services       Precautions / Restrictions Precautions Precautions:  None Restrictions Weight Bearing Restrictions: No      Mobility  Bed Mobility Overal bed mobility: Modified Independent                Transfers Overall transfer level: Modified independent                  Ambulation/Gait Ambulation/Gait assistance: Supervision Gait Distance (Feet): 100 Feet Assistive device: Rolling walker (2 wheeled) Gait Pattern/deviations: Decreased step length - right;Decreased step length - left;Decreased stride length Gait velocity: decreased   General Gait Details: slightly labored cadence without loss of balance, limited secondary to fatigue  Stairs            Wheelchair Mobility    Modified Rankin (Stroke Patients Only)       Balance Overall balance assessment: Needs assistance Sitting-balance support: Feet supported;No upper extremity supported Sitting balance-Leahy Scale: Good Sitting balance - Comments: seated at EOB   Standing balance support: During functional activity;No upper extremity supported Standing balance-Leahy Scale: Poor Standing balance comment: fair/poor without AD, fair/good using RW                             Pertinent Vitals/Pain Pain Assessment: Faces Faces Pain Scale: Hurts a little bit Pain Location: stomach Pain Descriptors / Indicators: Discomfort Pain Intervention(s): Limited activity within patient's tolerance;Monitored during session    Home Living Family/patient expects to be discharged to:: Assisted living               Home Equipment: Gilford Rile - 2  wheels      Prior Function Level of Independence: Needs assistance   Gait / Transfers Assistance Needed: household ambulator leaning on furntiure or using RW  ADL's / Homemaking Assistance Needed: assisted by ALF staff        Hand Dominance        Extremity/Trunk Assessment   Upper Extremity Assessment Upper Extremity Assessment: Overall WFL for tasks assessed    Lower Extremity Assessment Lower  Extremity Assessment: Generalized weakness    Cervical / Trunk Assessment Cervical / Trunk Assessment: Normal  Communication   Communication: No difficulties  Cognition Arousal/Alertness: Awake/alert Behavior During Therapy: WFL for tasks assessed/performed Overall Cognitive Status: Within Functional Limits for tasks assessed                                        General Comments      Exercises     Assessment/Plan    PT Assessment All further PT needs can be met in the next venue of care  PT Problem List Decreased strength;Decreased activity tolerance;Decreased balance;Decreased mobility       PT Treatment Interventions      PT Goals (Current goals can be found in the Care Plan section)  Acute Rehab PT Goals Patient Stated Goal: return home with ALF staff to assist PT Goal Formulation: With patient Time For Goal Achievement: 04/06/20 Potential to Achieve Goals: Good    Frequency     Barriers to discharge        Co-evaluation               AM-PAC PT "6 Clicks" Mobility  Outcome Measure Help needed turning from your back to your side while in a flat bed without using bedrails?: None Help needed moving from lying on your back to sitting on the side of a flat bed without using bedrails?: None Help needed moving to and from a bed to a chair (including a wheelchair)?: A Little Help needed standing up from a chair using your arms (e.g., wheelchair or bedside chair)?: A Little Help needed to walk in hospital room?: A Little Help needed climbing 3-5 steps with a railing? : A Little 6 Click Score: 20    End of Session   Activity Tolerance: Patient tolerated treatment well;Patient limited by fatigue Patient left: in chair;with call bell/phone within reach Nurse Communication: Mobility status PT Visit Diagnosis: Unsteadiness on feet (R26.81);Other abnormalities of gait and mobility (R26.89);Muscle weakness (generalized) (M62.81)    Time:  1110-1131 PT Time Calculation (min) (ACUTE ONLY): 21 min   Charges:   PT Evaluation $PT Eval Moderate Complexity: 1 Mod PT Treatments $Therapeutic Activity: 8-22 mins        1:43 PM, 04/06/20 Lonell Grandchild, MPT Physical Therapist with [facility] 336 [phone] office [phone] mobile phone

## 2020-04-06 NOTE — Care Management Important Message (Signed)
Important Message  Patient Details  Name: [name] MRN: [id number] Date of Birth: 1931-07-23   Medicare Important Message Given:  Yes     Tommy Medal 04/06/2020, 11:33 AM

## 2020-04-06 NOTE — Progress Notes (Signed)
Has had no nausea or abd pain since starting solid foods last night for supper.  Repeat covid and flu required by assisted living was negative.  To have [facility] RN, PT and OT. IV removed.  [facility] staff to transport

## 2020-04-06 NOTE — TOC Transition Note (Signed)
Transition of Care [facility]) - CM/SW Discharge Note   Patient Details  Name: [name] MRN: [id number] Date of Birth: 08-11-1931  [facility] CM/SW Contact:  Natasha Bence, LCSW Phone Number: 04/06/2020, 11:05 AM   Clinical Narrative:    CSW notified of patient's readiness for discharge. CSW contacted [facility] to confirm ability to take patient back to facility. Tammy agreeable to receive patient on 04/06/2020 and provide transportation to facility. CSW faxed FL2 and discharge summary. CSW also notified Joelene Millin with [facility] of patient's discharge and HHPT and HHRN orders to be provided at [facility]. TOC signing off.   Final next level of care: Assisted Living Barriers to Discharge: Barriers Resolved   Patient Goals and CMS Choice Patient states their goals for this hospitalization and ongoing recovery are:: Return to ALF with HHPT and [facility]   Choice offered to / list presented to : Patient  Discharge Placement                Patient to be transferred to facility by: [facility] Name of family member notified: Lauris Chroman Patient and family notified of of transfer: 04/06/20  Discharge Plan and Services In-house Referral: Clinical Social Work                        HH Arranged: Therapist, sports, PT [place] Agency: [facility] Date [place]: 04/06/20 Time [place]: 1104 Representative spoke with at [place]: [place] ([place]) Interventions     Readmission Risk Interventions No flowsheet data found.

## 2020-04-06 NOTE — Discharge Instructions (Signed)
Advised to follow up PCP in one week. Advised to take keflex for 5 days to complete 7 days treatment.

## 2020-04-06 NOTE — Discharge Summary (Signed)
Physician Discharge Summary  [name] YTK:[id number] DOB: [birth year] DOA: 04/02/2020  PCP: Kathyrn Drown, MD  Admit date: 04/02/2020.  Discharge date: 04/06/2020.  Admitted From: Home.  Disposition:   [facility].  Recommendations for Outpatient Follow-up:  1. Follow up with PCP in 1-2 weeks. 2. Please obtain BMP/CBC in one week. 3. Advised to take keflex for 5 days to complete 7 days treatment for UTI. 4.   She was admitted for small bowel obstruction which has resolved with conservative management.  Home Health: Yes.  PT/  RN. Equipment/Devices: None.  Discharge Condition: Stable. CODE STATUS:Full code Diet recommendation: Heart Healthy.  Brief Summary/ Hospital course: This 86 year old female with history of hemicolectomy secondary to diverticulitis, hypertension, hyperlipidemia, diabetes mellitus type 2  presented with chief complaints of abdominal pain associated with nausea and vomiting . CT abdomen/pelvis showed partial SBO, cholelithiasis, small hiatal hernia, small amount of ascites and aortic atherosclerosis. UA was suspicious for UTI and was started on antibiotics.  She was admitted for small bowel obstruction, She was managed conservatively with NG tube decompression. She was kept NPO , Abdominal pain has improved, she was able to pass flatus, NG tube removed. She tolerated clear liquid diet,  advanced to full liquids and soft diet,  tolerated well. She was treated for UTI with ceftriaxone and discharged on Keflex for 5 more days to complete 7-day treatment.  Small bowel obstruction has resolved. She has had bowel movement before discharge.  She was managed for below problems.  Discharge Diagnoses:  Active Problems:   SBO (small bowel obstruction) ([place])  1. Partial small bowel obstruction- Resolved, NG tube was removed 10/5.Abdominal x-ray obtained 10/5 shows normal bowel gas pattern. Patient is passing flatus. She tolerated clear liquid diet,  advanced diet to full liquid to soft blend. She had bowel movement today.  2. UTI-patient has abnormal UA, She was started on IV Rocephin. Urine culture grew gram Neg,  Pan sensitive  transitioned to keflex.  3. Diabetes mellitus type 2-continue sliding scale insulin NovoLog.  4. Hypertension-blood pressure is mildly elevated, continue as needed hydralazine. Resume home medications as she is able to tolerate PO.  5. Leukocytosis-patient presented with WBC 22,000, it has improved to 7000. Patient was started on IV Rocephin for UTI as above. She continues to be afebrile.   Discharge Instructions  Discharge Instructions    Call MD for:  difficulty breathing, headache or visual disturbances   Complete by: As directed    Call MD for:  persistant dizziness or light-headedness   Complete by: As directed    Call MD for:  persistant nausea and vomiting   Complete by: As directed    Call MD for:  severe uncontrolled pain   Complete by: As directed    Diet - low sodium heart healthy   Complete by: As directed    Diet Carb Modified   Complete by: As directed    Discharge instructions   Complete by: As directed    Advised to follow up PCP in one week. Advised to take keflex for 5 days to complete 7 days treatment.   Increase activity slowly   Complete by: As directed      Allergies as of 04/06/2020      Reactions   Hydrocortisone Itching   Lisinopril    Other reaction(s): Lethargy (intolerance)   Phenergan [promethazine Hcl] Other (See Comments)   hallucinations   Statins Other (See Comments)   Severe myalgias   Trazodone  And Nefazodone Cough   Prednisone Rash      Medication List    TAKE these medications   Acetaminophen Extra Strength 500 MG tablet Generic drug: acetaminophen TAKE (1) TABLET BY MOUTH EVERY (6) HOURS AS NEEDED FOR PAIN.**DO NOT EXCEED 8 TABLETS IN 24 HOURS** What changed: See the new instructions.   ALPRAZolam 0.25 MG tablet Commonly known as:  XANAX TAKE 1 TABLET BY MOUTH TWICE A DAY. What changed:   how much to take  how to take this  when to take this  additional instructions   amLODipine 10 MG tablet Commonly known as: NORVASC Take 1 tablet (10 mg total) by mouth daily.   Anti-Diarrheal 2 MG tablet Generic drug: loperamide TAKE 1 TABLET BY MOUTH 3 TIMES A DAY AS NEEDED FOR DIARRHEA. What changed: See the new instructions.   beta carotene w/minerals tablet TAKE 1 TABLET BY MOUTH AT BEDTIME.   cephALEXin 500 MG capsule Commonly known as: KEFLEX Take 1 capsule (500 mg total) by mouth 3 (three) times daily.   dicyclomine 10 MG capsule Commonly known as: Bentyl 1 bid prn abd cramps and loose stool What changed:   how much to take  how to take this  when to take this  reasons to take this  additional instructions   famotidine 20 MG tablet Commonly known as: PEPCID TAKE 1 TABLET BY MOUTH TWICE DAILY. What changed: when to take this   hydrALAZINE 25 MG tablet Commonly known as: APRESOLINE TAKE 1 TABLET BY MOUTH TWICE A DAY. What changed: when to take this   hydrochlorothiazide 25 MG tablet Commonly known as: HYDRODIURIL Take 1 tablet (25 mg total) by mouth daily.   hydrOXYzine 25 MG tablet Commonly known as: ATARAX/VISTARIL TAKE 1 TABLET BY MOUTH 3 TIMES A DAY AS NEEDED FOR ITCHING. What changed: See the new instructions.   lactobacillus acidophilus Tabs tablet Take 2 tablets by mouth 3 (three) times daily.   losartan 50 MG tablet Commonly known as: COZAAR Take 1 tablet (50 mg total) by mouth daily.   metFORMIN 500 MG tablet Commonly known as: GLUCOPHAGE Take one tablet 500mg  po in the morning and 1/2 tablet 250 mg po at supper What changed:   how much to take  how to take this  when to take this  additional instructions   pantoprazole 20 MG tablet Commonly known as: PROTONIX TAKE (1) TABLET BY MOUTH ONCE DAILY. What changed:   how much to take  how to take this  when to  take this  additional instructions   QC Hemorrhoidal 0.25-14-74.9 % rectal ointment Generic drug: phenylephrine-shark liver oil-mineral oil-petrolatum APPLY TO RECTUM 3 TIMES DAILY AS NEEDED FOR IRRITATION. What changed: See the new instructions.   saccharomyces boulardii 250 MG capsule Commonly known as: Florastor Take 1 capsule (250 mg total) by mouth 2 (two) times daily.   Vitamin B-12 2500 MCG Subl TAKE 1/2 TABLET BY MOUTH AT BEDTIME. What changed: when to take this   Vitamin D (Cholecalciferol) 10 MCG (400 UNIT) Tabs TAKE 1 TABLET BY MOUTH ONCE DAILY.       Follow-up Information    Kathyrn Drown, MD Follow up in 1 week(s).   Specialty: Family Medicine Contact information: [place] [address] [phone]        Josue Hector, MD .   Specialty: Cardiology Contact information: [phone] [address] [place] [address] [phone]  Allergies  Allergen Reactions  . Hydrocortisone Itching  . Lisinopril     Other reaction(s): Lethargy (intolerance)  . Phenergan [Promethazine Hcl] Other (See Comments)    hallucinations  . Statins Other (See Comments)    Severe myalgias  . Trazodone And Nefazodone Cough  . Prednisone Rash    Consultations:  None.   Procedures/Studies: CT ABDOMEN PELVIS W CONTRAST  Addendum Date: 04/02/2020   ADDENDUM REPORT: 04/02/2020 23:29 ADDENDUM: Results were discussed with Dr. Sabra Heck at 11:22 p.m. [place] on April 02, 2020. Electronically Signed   By: Virgina Norfolk M.D.   On: 04/02/2020 23:29   Result Date: 04/02/2020 CLINICAL DATA:  Abdominal distension. EXAM: CT ABDOMEN AND PELVIS WITH CONTRAST TECHNIQUE: Multidetector CT imaging of the abdomen and pelvis was performed using the standard protocol following bolus administration of intravenous contrast. CONTRAST:  35mL OMNIPAQUE IOHEXOL 300 MG/ML  SOLN COMPARISON:  August 14, 2018 FINDINGS: Lower chest: No acute  abnormality. Hepatobiliary: No focal liver abnormality is seen. A very small amount of posterior perihepatic fluid is seen. Numerous subcentimeter gallstones are seen within the gallbladder lumen without evidence of gallbladder wall thickening or biliary dilatation. Pancreas: Unremarkable. No pancreatic ductal dilatation or surrounding inflammatory changes. Spleen: Normal in size without focal abnormality. Adrenals/Urinary Tract: Adrenal glands are unremarkable. Kidneys are normal, without renal calculi, focal lesion, or hydronephrosis. Very mild diffuse urinary bladder wall thickening is seen. Stomach/Bowel: There is a small hiatal hernia. The appendix is not clearly identified. Multiple dilated small bowel loops are seen within the abdomen and pelvis (maximum small bowel diameter of approximately 3.1 cm). A transition zone is seen within the left lower quadrant. Vascular/Lymphatic: There is marked severity calcification of the abdominal aorta and bilateral common iliac arteries, without evidence of aneurysmal dilatation. No enlarged abdominal or pelvic lymph nodes. Reproductive: Status post hysterectomy. No adnexal masses. Other: Radiopaque surgical coils are seen along the anterior aspect of the abdomen and pelvis. A small amount of posterior pelvic free fluid is noted. Musculoskeletal: Multilevel degenerative changes seen throughout the lumbar spine IMPRESSION: 1. Partial small bowel obstruction. 2. Cholelithiasis. 3. Small hiatal hernia. 4. Very small amount of ascites. 5. Aortic atherosclerosis. Aortic Atherosclerosis (ICD10-I70.0). Electronically Signed: By: Virgina Norfolk M.D. On: 04/02/2020 23:24   DG Abd 2 Views  Result Date: 04/04/2020 CLINICAL DATA:  Small-bowel obstruction EXAM: ABDOMEN - 2 VIEW COMPARISON:  04/02/2020 FINDINGS: Orogastric or nasogastric tube tip only in the distal esophagus just at the diaphragm level. Bowel gas pattern within normal limits presently without dilated small or  large bowel. No free air. IMPRESSION: Orogastric or nasogastric tube tip only in the distal esophagus just at the diaphragm level. Normal bowel gas pattern presently. No sign of obstruction or free air. Electronically Signed   By: Nelson Chimes M.D.   On: 04/04/2020 11:22     Subjective: Patient was seen and examined at bed side, Abdominal pain has resolved, able to tolerate soft diet, denies Nausea and vomiting, has had a bowel movement yesterday. She ambulated well in the hallway.  Discharge Exam: Vitals:   04/06/20 0521 04/06/20 0836  BP: (!) 167/56 (!) 180/65  Pulse: 65 72  Resp: 16   Temp: 99 F (37.2 C)   SpO2: 96%    Vitals:   04/05/20 1500 04/05/20 2041 04/06/20 0521 04/06/20 0836  BP: (!) 154/57 (!) 165/57 (!) 167/56 (!) 180/65  Pulse: 62 (!) 59 65 72  Resp: 18 16 16    Temp: 98.4 F (36.9 C) 98.6  F (37 C) 99 F (37.2 C)   TempSrc: Oral  Oral   SpO2: 95% 97% 96%   Weight:      Height:        General: Pt is alert, awake, not in acute distress Cardiovascular: RRR, S1/S2 +, no rubs, no gallops Respiratory: CTA bilaterally, no wheezing, no rhonchi Abdominal: Soft, NT, ND, bowel sounds + Extremities: no edema, no cyanosis    The results of significant diagnostics from this hospitalization (including imaging, microbiology, ancillary and laboratory) are listed below for reference.     Microbiology: Recent Results (from the past 240 hour(s))  Urine Culture     Status: Abnormal   Collection Time: 04/02/20 11:11 PM   Specimen: Urine, Clean Catch  Result Value Ref Range Status   Specimen Description   Final    URINE, CLEAN CATCH Performed at [facility], [address], [place], [place] [address]    Special Requests   Final    NONE Performed at [facility], [address], [place], [place] [address]    Culture >=100,000 COLONIES/mL PROTEUS MIRABILIS (A)  Final   Report Status 04/06/2020 FINAL  Final   Organism ID, Bacteria PROTEUS MIRABILIS (A)  Final       Susceptibility   Proteus mirabilis - MIC*    AMPICILLIN <=2 SENSITIVE Sensitive     CEFAZOLIN <=4 SENSITIVE Sensitive     CEFTRIAXONE <=0.25 SENSITIVE Sensitive     CIPROFLOXACIN <=0.25 SENSITIVE Sensitive     GENTAMICIN <=1 SENSITIVE Sensitive     IMIPENEM 2 SENSITIVE Sensitive     NITROFURANTOIN RESISTANT Resistant     TRIMETH/SULFA <=20 SENSITIVE Sensitive     AMPICILLIN/SULBACTAM <=2 SENSITIVE Sensitive     PIP/TAZO <=4 SENSITIVE Sensitive     * >=100,000 COLONIES/mL PROTEUS MIRABILIS  Respiratory Panel by RT PCR (Flu A&B, Covid) - Nasopharyngeal Swab     Status: None   Collection Time: 04/03/20  1:52 AM   Specimen: Nasopharyngeal Swab  Result Value Ref Range Status   SARS Coronavirus 2 by RT PCR NEGATIVE NEGATIVE Final    Comment: (NOTE) SARS-CoV-2 target nucleic acids are NOT DETECTED.  The SARS-CoV-2 RNA is generally detectable in upper respiratoy specimens during the acute phase of infection. The lowest concentration of SARS-CoV-2 viral copies this assay can detect is 131 copies/mL. A negative result does not preclude SARS-Cov-2 infection and should not be used as the sole basis for treatment or other patient management decisions. A negative result may occur with  improper specimen collection/handling, submission of specimen other than nasopharyngeal swab, presence of viral mutation(s) within the areas targeted by this assay, and inadequate number of viral copies (<131 copies/mL). A negative result must be combined with clinical observations, patient history, and epidemiological information. The expected result is Negative.  Fact Sheet for Patients:  PinkCheek.be  Fact Sheet for Healthcare Providers:  GravelBags.it  This test is no t yet approved or cleared by the [place] FDA and  has been authorized for detection and/or diagnosis of SARS-CoV-2 by FDA under an Emergency Use Authorization (EUA). This EUA  will remain  in effect (meaning this test can be used) for the duration of the COVID-19 declaration under Section 564(b)(1) of the Act, 21 U.S.C. section 360bbb-3(b)(1), unless the authorization is terminated or revoked sooner.     Influenza A by PCR NEGATIVE NEGATIVE Final   Influenza B by PCR NEGATIVE NEGATIVE Final    Comment: (NOTE) The Xpert Xpress SARS-CoV-2/FLU/RSV assay is intended as  an aid in  the diagnosis of influenza from Nasopharyngeal swab specimens and  should not be used as a sole basis for treatment. Nasal washings and  aspirates are unacceptable for Xpert Xpress SARS-CoV-2/FLU/RSV  testing.  Fact Sheet for Patients: PinkCheek.be  Fact Sheet for Healthcare Providers: GravelBags.it  This test is not yet approved or cleared by the [place] FDA and  has been authorized for detection and/or diagnosis of SARS-CoV-2 by  FDA under an Emergency Use Authorization (EUA). This EUA will remain  in effect (meaning this test can be used) for the duration of the  Covid-19 declaration under Section 564(b)(1) of the Act, 21  U.S.C. section 360bbb-3(b)(1), unless the authorization is  terminated or revoked. Performed at [facility], [address], [place], [place] [address]      Labs: BNP (last 3 results) No results for input(s): BNP in the last 8760 hours. Basic Metabolic Panel: Recent Labs  Lab 04/02/20 2144 04/03/20 0629 04/04/20 0615 04/05/20 0548 04/06/20 0556  NA 132* 136 138 137 139  K 3.7 4.4 3.8 3.5 3.5  CL 95* 97* 100 102 100  CO2 25 28 29 27 26   GLUCOSE 170* 149* 120* 105* 126*  BUN 26* 22 16 10 8   CREATININE 1.13* 1.01* 0.93 0.74 0.82  CALCIUM 9.6 9.0 9.2 8.6* 9.5  MG  --  1.8  --   --   --    Liver Function Tests: Recent Labs  Lab 04/02/20 2144 04/03/20 0629 04/04/20 0615  AST 27 23 21   ALT 20 15 16   ALKPHOS 91 76 70  BILITOT 0.9 1.1 1.1  PROT 7.5 6.7 6.2*  ALBUMIN 4.3 3.6 3.7    Recent Labs  Lab 04/02/20 2144  LIPASE 25   No results for input(s): AMMONIA in the last 168 hours. CBC: Recent Labs  Lab 04/02/20 2144 04/03/20 0629 04/04/20 0615 04/06/20 0556  WBC 22.0* 10.7* 7.0 7.1  NEUTROABS  --  7.8*  --   --   HGB 13.1 12.3 11.9* 12.3  HCT 39.2 38.2 37.2 38.8  MCV 86.7 87.0 89.4 88.8  PLT 401* 356 308 317   Cardiac Enzymes: No results for input(s): CKTOTAL, CKMB, CKMBINDEX, TROPONINI in the last 168 hours. BNP: Invalid input(s): POCBNP CBG: Recent Labs  Lab 04/05/20 0743 04/05/20 1113 04/05/20 1619 04/05/20 2043 04/06/20 0748  GLUCAP 92 112* 108* 111* 124*   D-Dimer No results for input(s): DDIMER in the last 72 hours. Hgb A1c No results for input(s): HGBA1C in the last 72 hours. Lipid Profile No results for input(s): CHOL, HDL, LDLCALC, TRIG, CHOLHDL, LDLDIRECT in the last 72 hours. Thyroid function studies No results for input(s): TSH, T4TOTAL, T3FREE, THYROIDAB in the last 72 hours.  Invalid input(s): FREET3 Anemia work up No results for input(s): VITAMINB12, FOLATE, FERRITIN, TIBC, IRON, RETICCTPCT in the last 72 hours. Urinalysis    Component Value Date/Time   COLORURINE YELLOW 04/02/2020 2003   APPEARANCEUR CLOUDY (A) 04/02/2020 2003   LABSPEC 1.013 04/02/2020 2003   PHURINE 8.0 04/02/2020 2003   GLUCOSEU NEGATIVE 04/02/2020 2003   HGBUR NEGATIVE 04/02/2020 2003   [place] NEGATIVE 04/02/2020 2003   [place] 04/02/2020 2003   PROTEINUR 30 (A) 04/02/2020 2003   UROBILINOGEN 0.2 12/15/2014 1956   NITRITE NEGATIVE 04/02/2020 2003   LEUKOCYTESUR LARGE (A) 04/02/2020 2003   Sepsis Labs Invalid input(s): PROCALCITONIN,  WBC,  LACTICIDVEN Microbiology Recent Results (from the past 240 hour(s))  Urine Culture     Status: Abnormal  Collection Time: 04/02/20 11:11 PM   Specimen: Urine, Clean Catch  Result Value Ref Range Status   Specimen Description   Final    URINE, CLEAN CATCH Performed at [facility], [address] [place], [place] [address]    Special Requests   Final    NONE Performed at [facility], [address] [place], [place] [address]    Culture >=100,000 COLONIES/mL PROTEUS MIRABILIS (A)  Final   Report Status 04/06/2020 FINAL  Final   Organism ID, Bacteria PROTEUS MIRABILIS (A)  Final      Susceptibility   Proteus mirabilis - MIC*    AMPICILLIN <=2 SENSITIVE Sensitive     CEFAZOLIN <=4 SENSITIVE Sensitive     CEFTRIAXONE <=0.25 SENSITIVE Sensitive     CIPROFLOXACIN <=0.25 SENSITIVE Sensitive     GENTAMICIN <=1 SENSITIVE Sensitive     IMIPENEM 2 SENSITIVE Sensitive     NITROFURANTOIN RESISTANT Resistant     TRIMETH/SULFA <=20 SENSITIVE Sensitive     AMPICILLIN/SULBACTAM <=2 SENSITIVE Sensitive     PIP/TAZO <=4 SENSITIVE Sensitive     * >=100,000 COLONIES/mL PROTEUS MIRABILIS  Respiratory Panel by RT PCR (Flu A&B, Covid) - Nasopharyngeal Swab     Status: None   Collection Time: 04/03/20  1:52 AM   Specimen: Nasopharyngeal Swab  Result Value Ref Range Status   SARS Coronavirus 2 by RT PCR NEGATIVE NEGATIVE Final    Comment: (NOTE) SARS-CoV-2 target nucleic acids are NOT DETECTED.  The SARS-CoV-2 RNA is generally detectable in upper respiratoy specimens during the acute phase of infection. The lowest concentration of SARS-CoV-2 viral copies this assay can detect is 131 copies/mL. A negative result does not preclude SARS-Cov-2 infection and should not be used as the sole basis for treatment or other patient management decisions. A negative result may occur with  improper specimen collection/handling, submission of specimen other than nasopharyngeal swab, presence of viral mutation(s) within the areas targeted by this assay, and inadequate number of viral copies (<131 copies/mL). A negative result must be combined with clinical observations, patient history, and epidemiological information. The expected result is Negative.  Fact Sheet for Patients:   PinkCheek.be  Fact Sheet for Healthcare Providers:  GravelBags.it  This test is no t yet approved or cleared by the [place] FDA and  has been authorized for detection and/or diagnosis of SARS-CoV-2 by FDA under an Emergency Use Authorization (EUA). This EUA will remain  in effect (meaning this test can be used) for the duration of the COVID-19 declaration under Section 564(b)(1) of the Act, 21 U.S.C. section 360bbb-3(b)(1), unless the authorization is terminated or revoked sooner.     Influenza A by PCR NEGATIVE NEGATIVE Final   Influenza B by PCR NEGATIVE NEGATIVE Final    Comment: (NOTE) The Xpert Xpress SARS-CoV-2/FLU/RSV assay is intended as an aid in  the diagnosis of influenza from Nasopharyngeal swab specimens and  should not be used as a sole basis for treatment. Nasal washings and  aspirates are unacceptable for Xpert Xpress SARS-CoV-2/FLU/RSV  testing.  Fact Sheet for Patients: PinkCheek.be  Fact Sheet for Healthcare Providers: GravelBags.it  This test is not yet approved or cleared by the [place] FDA and  has been authorized for detection and/or diagnosis of SARS-CoV-2 by  FDA under an Emergency Use Authorization (EUA). This EUA will remain  in effect (meaning this test can be used) for the duration of the  Covid-19 declaration under Section 564(b)(1) of the Act, 21  U.S.C. section 360bbb-3(b)(1),  unless the authorization is  terminated or revoked. Performed at [facility], [address] [place], [place] [address]      Time coordinating discharge: Over 30 minutes  SIGNED:   Shawna Clamp, MD  [facility] 04/06/2020, 11:25 AM Pager   If 7PM-7AM, please contact night-coverage [email or web address]

## 2020-04-07 DIAGNOSIS — K5669 Other partial intestinal obstruction: Secondary | ICD-10-CM | POA: Diagnosis not present

## 2020-04-07 DIAGNOSIS — K5790 Diverticulosis of intestine, part unspecified, without perforation or abscess without bleeding: Secondary | ICD-10-CM | POA: Diagnosis not present

## 2020-04-07 DIAGNOSIS — E785 Hyperlipidemia, unspecified: Secondary | ICD-10-CM | POA: Diagnosis not present

## 2020-04-07 DIAGNOSIS — M6281 Muscle weakness (generalized): Secondary | ICD-10-CM | POA: Diagnosis not present

## 2020-04-07 DIAGNOSIS — K449 Diaphragmatic hernia without obstruction or gangrene: Secondary | ICD-10-CM | POA: Diagnosis not present

## 2020-04-07 DIAGNOSIS — E119 Type 2 diabetes mellitus without complications: Secondary | ICD-10-CM | POA: Diagnosis not present

## 2020-04-07 DIAGNOSIS — Z9181 History of falling: Secondary | ICD-10-CM | POA: Diagnosis not present

## 2020-04-07 DIAGNOSIS — Z7984 Long term (current) use of oral hypoglycemic drugs: Secondary | ICD-10-CM | POA: Diagnosis not present

## 2020-04-07 DIAGNOSIS — I10 Essential (primary) hypertension: Secondary | ICD-10-CM | POA: Diagnosis not present

## 2020-04-07 DIAGNOSIS — N390 Urinary tract infection, site not specified: Secondary | ICD-10-CM | POA: Diagnosis not present

## 2020-04-07 DIAGNOSIS — R2681 Unsteadiness on feet: Secondary | ICD-10-CM | POA: Diagnosis not present

## 2020-04-07 DIAGNOSIS — I700 Atherosclerosis of aorta: Secondary | ICD-10-CM | POA: Diagnosis not present

## 2020-04-10 ENCOUNTER — Encounter: Payer: Self-pay | Admitting: Family Medicine

## 2020-04-10 ENCOUNTER — Ambulatory Visit (INDEPENDENT_AMBULATORY_CARE_PROVIDER_SITE_OTHER): Payer: Medicare Other | Admitting: Family Medicine

## 2020-04-10 ENCOUNTER — Other Ambulatory Visit: Payer: Self-pay | Admitting: Family Medicine

## 2020-04-10 VITALS — BP 124/74 | HR 89 | Temp 96.4°F | Wt 166.6 lb

## 2020-04-10 DIAGNOSIS — E1169 Type 2 diabetes mellitus with other specified complication: Secondary | ICD-10-CM | POA: Diagnosis not present

## 2020-04-10 DIAGNOSIS — E785 Hyperlipidemia, unspecified: Secondary | ICD-10-CM

## 2020-04-10 DIAGNOSIS — N390 Urinary tract infection, site not specified: Secondary | ICD-10-CM | POA: Diagnosis not present

## 2020-04-10 DIAGNOSIS — Z7984 Long term (current) use of oral hypoglycemic drugs: Secondary | ICD-10-CM | POA: Diagnosis not present

## 2020-04-10 DIAGNOSIS — E119 Type 2 diabetes mellitus without complications: Secondary | ICD-10-CM | POA: Diagnosis not present

## 2020-04-10 DIAGNOSIS — I10 Essential (primary) hypertension: Secondary | ICD-10-CM | POA: Diagnosis not present

## 2020-04-10 DIAGNOSIS — K5790 Diverticulosis of intestine, part unspecified, without perforation or abscess without bleeding: Secondary | ICD-10-CM | POA: Diagnosis not present

## 2020-04-10 DIAGNOSIS — I700 Atherosclerosis of aorta: Secondary | ICD-10-CM | POA: Diagnosis not present

## 2020-04-10 NOTE — Progress Notes (Signed)
   Subjective:    Patient ID: [name], female    DOB: 08-05-1931, 86 y.o.   MRN: [id number]  HPI Pt here for hospital follow up. Pt was in [facility] for 4 days (04/02/20-04/06/20) for small bowel obstruction.  Pt went to ER due to vomiting and pt states that she knew what was going on because she has had this before Patient was in the hospital because of small bowel obstruction She is now doing better and the bowels are moving better she is urinating well denies any regurgitation denies blood in stool  Review of Systems  Constitutional: Negative for activity change, appetite change and fatigue.  HENT: Negative for congestion and rhinorrhea.   Respiratory: Negative for cough and shortness of breath.   Cardiovascular: Negative for chest pain and leg swelling.  Gastrointestinal: Negative for abdominal pain and diarrhea.  Endocrine: Negative for polydipsia and polyphagia.  Skin: Negative for color change.  Neurological: Negative for dizziness and weakness.  Psychiatric/Behavioral: Negative for behavioral problems and confusion.       Objective:   Physical Exam Vitals reviewed.  Constitutional:      General: She is not in acute distress. HENT:     Head: Normocephalic and atraumatic.  Eyes:     General:        Right eye: No discharge.        Left eye: No discharge.  Neck:     Trachea: No tracheal deviation.  Cardiovascular:     Rate and Rhythm: Normal rate and regular rhythm.     Heart sounds: Normal heart sounds. No murmur heard.   Pulmonary:     Effort: Pulmonary effort is normal. No respiratory distress.     Breath sounds: Normal breath sounds.  Lymphadenopathy:     Cervical: No cervical adenopathy.  Skin:    General: Skin is warm and dry.  Neurological:     Mental Status: She is alert.     Coordination: Coordination normal.  Psychiatric:        Behavior: Behavior normal.           Assessment & Plan:  1. Essential hypertension Blood pressure good control  continue current measures.  Minimize salt in diet check lab work - Basic Metabolic Panel (BMET) - Hemoglobin A1c  2. Hyperlipidemia associated with type 2 diabetes mellitus ([place]) Diabetes check lab work before next follow-up.  To look at A1c minimize starches sugars are reading well at home - Basic Metabolic Panel (BMET) - Hemoglobin A1c  3. Controlled type 2 diabetes mellitus without complication, without long-term current use of insulin ([place]) See above this particular diagnosis put in by error was removed - Basic Metabolic Panel (BMET) - Hemoglobin A1c

## 2020-04-11 DIAGNOSIS — I10 Essential (primary) hypertension: Secondary | ICD-10-CM | POA: Diagnosis not present

## 2020-04-11 DIAGNOSIS — Z7984 Long term (current) use of oral hypoglycemic drugs: Secondary | ICD-10-CM | POA: Diagnosis not present

## 2020-04-11 DIAGNOSIS — Z20828 Contact with and (suspected) exposure to other viral communicable diseases: Secondary | ICD-10-CM | POA: Diagnosis not present

## 2020-04-11 DIAGNOSIS — E119 Type 2 diabetes mellitus without complications: Secondary | ICD-10-CM | POA: Diagnosis not present

## 2020-04-11 DIAGNOSIS — U071 COVID-19: Secondary | ICD-10-CM | POA: Diagnosis not present

## 2020-04-11 DIAGNOSIS — N390 Urinary tract infection, site not specified: Secondary | ICD-10-CM | POA: Diagnosis not present

## 2020-04-11 DIAGNOSIS — K5790 Diverticulosis of intestine, part unspecified, without perforation or abscess without bleeding: Secondary | ICD-10-CM | POA: Diagnosis not present

## 2020-04-11 DIAGNOSIS — I700 Atherosclerosis of aorta: Secondary | ICD-10-CM | POA: Diagnosis not present

## 2020-04-12 ENCOUNTER — Other Ambulatory Visit: Payer: Self-pay | Admitting: Family Medicine

## 2020-04-12 DIAGNOSIS — N390 Urinary tract infection, site not specified: Secondary | ICD-10-CM | POA: Diagnosis not present

## 2020-04-12 DIAGNOSIS — E119 Type 2 diabetes mellitus without complications: Secondary | ICD-10-CM | POA: Diagnosis not present

## 2020-04-12 DIAGNOSIS — I10 Essential (primary) hypertension: Secondary | ICD-10-CM | POA: Diagnosis not present

## 2020-04-12 DIAGNOSIS — I700 Atherosclerosis of aorta: Secondary | ICD-10-CM | POA: Diagnosis not present

## 2020-04-12 DIAGNOSIS — Z7984 Long term (current) use of oral hypoglycemic drugs: Secondary | ICD-10-CM | POA: Diagnosis not present

## 2020-04-12 DIAGNOSIS — K5790 Diverticulosis of intestine, part unspecified, without perforation or abscess without bleeding: Secondary | ICD-10-CM | POA: Diagnosis not present

## 2020-04-13 DIAGNOSIS — E119 Type 2 diabetes mellitus without complications: Secondary | ICD-10-CM | POA: Diagnosis not present

## 2020-04-13 DIAGNOSIS — I700 Atherosclerosis of aorta: Secondary | ICD-10-CM | POA: Diagnosis not present

## 2020-04-13 DIAGNOSIS — K5790 Diverticulosis of intestine, part unspecified, without perforation or abscess without bleeding: Secondary | ICD-10-CM | POA: Diagnosis not present

## 2020-04-13 DIAGNOSIS — U071 COVID-19: Secondary | ICD-10-CM | POA: Diagnosis not present

## 2020-04-13 DIAGNOSIS — N390 Urinary tract infection, site not specified: Secondary | ICD-10-CM | POA: Diagnosis not present

## 2020-04-13 DIAGNOSIS — M1711 Unilateral primary osteoarthritis, right knee: Secondary | ICD-10-CM | POA: Diagnosis not present

## 2020-04-13 DIAGNOSIS — I10 Essential (primary) hypertension: Secondary | ICD-10-CM | POA: Diagnosis not present

## 2020-04-13 DIAGNOSIS — Z7984 Long term (current) use of oral hypoglycemic drugs: Secondary | ICD-10-CM | POA: Diagnosis not present

## 2020-04-13 DIAGNOSIS — Z20828 Contact with and (suspected) exposure to other viral communicable diseases: Secondary | ICD-10-CM | POA: Diagnosis not present

## 2020-04-14 DIAGNOSIS — E119 Type 2 diabetes mellitus without complications: Secondary | ICD-10-CM | POA: Diagnosis not present

## 2020-04-14 DIAGNOSIS — Z7984 Long term (current) use of oral hypoglycemic drugs: Secondary | ICD-10-CM | POA: Diagnosis not present

## 2020-04-14 DIAGNOSIS — I10 Essential (primary) hypertension: Secondary | ICD-10-CM | POA: Diagnosis not present

## 2020-04-14 DIAGNOSIS — N390 Urinary tract infection, site not specified: Secondary | ICD-10-CM | POA: Diagnosis not present

## 2020-04-14 DIAGNOSIS — K5790 Diverticulosis of intestine, part unspecified, without perforation or abscess without bleeding: Secondary | ICD-10-CM | POA: Diagnosis not present

## 2020-04-14 DIAGNOSIS — I700 Atherosclerosis of aorta: Secondary | ICD-10-CM | POA: Diagnosis not present

## 2020-04-17 DIAGNOSIS — I10 Essential (primary) hypertension: Secondary | ICD-10-CM | POA: Diagnosis not present

## 2020-04-17 DIAGNOSIS — E119 Type 2 diabetes mellitus without complications: Secondary | ICD-10-CM | POA: Diagnosis not present

## 2020-04-17 DIAGNOSIS — I700 Atherosclerosis of aorta: Secondary | ICD-10-CM | POA: Diagnosis not present

## 2020-04-17 DIAGNOSIS — K5790 Diverticulosis of intestine, part unspecified, without perforation or abscess without bleeding: Secondary | ICD-10-CM | POA: Diagnosis not present

## 2020-04-17 DIAGNOSIS — Z7984 Long term (current) use of oral hypoglycemic drugs: Secondary | ICD-10-CM | POA: Diagnosis not present

## 2020-04-17 DIAGNOSIS — N390 Urinary tract infection, site not specified: Secondary | ICD-10-CM | POA: Diagnosis not present

## 2020-04-18 DIAGNOSIS — K5790 Diverticulosis of intestine, part unspecified, without perforation or abscess without bleeding: Secondary | ICD-10-CM | POA: Diagnosis not present

## 2020-04-18 DIAGNOSIS — Z20828 Contact with and (suspected) exposure to other viral communicable diseases: Secondary | ICD-10-CM | POA: Diagnosis not present

## 2020-04-18 DIAGNOSIS — I10 Essential (primary) hypertension: Secondary | ICD-10-CM | POA: Diagnosis not present

## 2020-04-18 DIAGNOSIS — E119 Type 2 diabetes mellitus without complications: Secondary | ICD-10-CM | POA: Diagnosis not present

## 2020-04-18 DIAGNOSIS — N390 Urinary tract infection, site not specified: Secondary | ICD-10-CM | POA: Diagnosis not present

## 2020-04-18 DIAGNOSIS — Z7984 Long term (current) use of oral hypoglycemic drugs: Secondary | ICD-10-CM | POA: Diagnosis not present

## 2020-04-18 DIAGNOSIS — I700 Atherosclerosis of aorta: Secondary | ICD-10-CM | POA: Diagnosis not present

## 2020-04-18 DIAGNOSIS — U071 COVID-19: Secondary | ICD-10-CM | POA: Diagnosis not present

## 2020-04-19 DIAGNOSIS — I700 Atherosclerosis of aorta: Secondary | ICD-10-CM | POA: Diagnosis not present

## 2020-04-19 DIAGNOSIS — E119 Type 2 diabetes mellitus without complications: Secondary | ICD-10-CM | POA: Diagnosis not present

## 2020-04-19 DIAGNOSIS — K5790 Diverticulosis of intestine, part unspecified, without perforation or abscess without bleeding: Secondary | ICD-10-CM | POA: Diagnosis not present

## 2020-04-19 DIAGNOSIS — Z7984 Long term (current) use of oral hypoglycemic drugs: Secondary | ICD-10-CM | POA: Diagnosis not present

## 2020-04-19 DIAGNOSIS — N390 Urinary tract infection, site not specified: Secondary | ICD-10-CM | POA: Diagnosis not present

## 2020-04-19 DIAGNOSIS — I10 Essential (primary) hypertension: Secondary | ICD-10-CM | POA: Diagnosis not present

## 2020-04-20 DIAGNOSIS — Z7984 Long term (current) use of oral hypoglycemic drugs: Secondary | ICD-10-CM | POA: Diagnosis not present

## 2020-04-20 DIAGNOSIS — Z20828 Contact with and (suspected) exposure to other viral communicable diseases: Secondary | ICD-10-CM | POA: Diagnosis not present

## 2020-04-20 DIAGNOSIS — I700 Atherosclerosis of aorta: Secondary | ICD-10-CM | POA: Diagnosis not present

## 2020-04-20 DIAGNOSIS — N390 Urinary tract infection, site not specified: Secondary | ICD-10-CM | POA: Diagnosis not present

## 2020-04-20 DIAGNOSIS — K5790 Diverticulosis of intestine, part unspecified, without perforation or abscess without bleeding: Secondary | ICD-10-CM | POA: Diagnosis not present

## 2020-04-20 DIAGNOSIS — I10 Essential (primary) hypertension: Secondary | ICD-10-CM | POA: Diagnosis not present

## 2020-04-20 DIAGNOSIS — U071 COVID-19: Secondary | ICD-10-CM | POA: Diagnosis not present

## 2020-04-20 DIAGNOSIS — E119 Type 2 diabetes mellitus without complications: Secondary | ICD-10-CM | POA: Diagnosis not present

## 2020-04-21 DIAGNOSIS — M1711 Unilateral primary osteoarthritis, right knee: Secondary | ICD-10-CM | POA: Diagnosis not present

## 2020-04-25 DIAGNOSIS — N390 Urinary tract infection, site not specified: Secondary | ICD-10-CM | POA: Diagnosis not present

## 2020-04-25 DIAGNOSIS — Z20828 Contact with and (suspected) exposure to other viral communicable diseases: Secondary | ICD-10-CM | POA: Diagnosis not present

## 2020-04-25 DIAGNOSIS — U071 COVID-19: Secondary | ICD-10-CM | POA: Diagnosis not present

## 2020-04-25 DIAGNOSIS — I10 Essential (primary) hypertension: Secondary | ICD-10-CM | POA: Diagnosis not present

## 2020-04-25 DIAGNOSIS — E119 Type 2 diabetes mellitus without complications: Secondary | ICD-10-CM | POA: Diagnosis not present

## 2020-04-25 DIAGNOSIS — I700 Atherosclerosis of aorta: Secondary | ICD-10-CM | POA: Diagnosis not present

## 2020-04-25 DIAGNOSIS — Z7984 Long term (current) use of oral hypoglycemic drugs: Secondary | ICD-10-CM | POA: Diagnosis not present

## 2020-04-25 DIAGNOSIS — K5790 Diverticulosis of intestine, part unspecified, without perforation or abscess without bleeding: Secondary | ICD-10-CM | POA: Diagnosis not present

## 2020-04-26 DIAGNOSIS — I700 Atherosclerosis of aorta: Secondary | ICD-10-CM | POA: Diagnosis not present

## 2020-04-26 DIAGNOSIS — Z7984 Long term (current) use of oral hypoglycemic drugs: Secondary | ICD-10-CM | POA: Diagnosis not present

## 2020-04-26 DIAGNOSIS — K5790 Diverticulosis of intestine, part unspecified, without perforation or abscess without bleeding: Secondary | ICD-10-CM | POA: Diagnosis not present

## 2020-04-26 DIAGNOSIS — N390 Urinary tract infection, site not specified: Secondary | ICD-10-CM | POA: Diagnosis not present

## 2020-04-26 DIAGNOSIS — E119 Type 2 diabetes mellitus without complications: Secondary | ICD-10-CM | POA: Diagnosis not present

## 2020-04-26 DIAGNOSIS — I10 Essential (primary) hypertension: Secondary | ICD-10-CM | POA: Diagnosis not present

## 2020-04-27 DIAGNOSIS — M1711 Unilateral primary osteoarthritis, right knee: Secondary | ICD-10-CM | POA: Diagnosis not present

## 2020-04-27 DIAGNOSIS — U071 COVID-19: Secondary | ICD-10-CM | POA: Diagnosis not present

## 2020-04-27 DIAGNOSIS — Z20828 Contact with and (suspected) exposure to other viral communicable diseases: Secondary | ICD-10-CM | POA: Diagnosis not present

## 2020-04-28 DIAGNOSIS — E119 Type 2 diabetes mellitus without complications: Secondary | ICD-10-CM | POA: Diagnosis not present

## 2020-04-28 DIAGNOSIS — Z7984 Long term (current) use of oral hypoglycemic drugs: Secondary | ICD-10-CM | POA: Diagnosis not present

## 2020-04-28 DIAGNOSIS — N390 Urinary tract infection, site not specified: Secondary | ICD-10-CM | POA: Diagnosis not present

## 2020-04-28 DIAGNOSIS — K5790 Diverticulosis of intestine, part unspecified, without perforation or abscess without bleeding: Secondary | ICD-10-CM | POA: Diagnosis not present

## 2020-04-28 DIAGNOSIS — I10 Essential (primary) hypertension: Secondary | ICD-10-CM | POA: Diagnosis not present

## 2020-04-28 DIAGNOSIS — I700 Atherosclerosis of aorta: Secondary | ICD-10-CM | POA: Diagnosis not present

## 2020-05-01 DIAGNOSIS — Z7984 Long term (current) use of oral hypoglycemic drugs: Secondary | ICD-10-CM | POA: Diagnosis not present

## 2020-05-01 DIAGNOSIS — E119 Type 2 diabetes mellitus without complications: Secondary | ICD-10-CM | POA: Diagnosis not present

## 2020-05-01 DIAGNOSIS — I10 Essential (primary) hypertension: Secondary | ICD-10-CM | POA: Diagnosis not present

## 2020-05-01 DIAGNOSIS — K5790 Diverticulosis of intestine, part unspecified, without perforation or abscess without bleeding: Secondary | ICD-10-CM | POA: Diagnosis not present

## 2020-05-01 DIAGNOSIS — N390 Urinary tract infection, site not specified: Secondary | ICD-10-CM | POA: Diagnosis not present

## 2020-05-01 DIAGNOSIS — I700 Atherosclerosis of aorta: Secondary | ICD-10-CM | POA: Diagnosis not present

## 2020-05-02 DIAGNOSIS — Z7984 Long term (current) use of oral hypoglycemic drugs: Secondary | ICD-10-CM | POA: Diagnosis not present

## 2020-05-02 DIAGNOSIS — Z20828 Contact with and (suspected) exposure to other viral communicable diseases: Secondary | ICD-10-CM | POA: Diagnosis not present

## 2020-05-02 DIAGNOSIS — I10 Essential (primary) hypertension: Secondary | ICD-10-CM | POA: Diagnosis not present

## 2020-05-02 DIAGNOSIS — I700 Atherosclerosis of aorta: Secondary | ICD-10-CM | POA: Diagnosis not present

## 2020-05-02 DIAGNOSIS — E119 Type 2 diabetes mellitus without complications: Secondary | ICD-10-CM | POA: Diagnosis not present

## 2020-05-02 DIAGNOSIS — K5790 Diverticulosis of intestine, part unspecified, without perforation or abscess without bleeding: Secondary | ICD-10-CM | POA: Diagnosis not present

## 2020-05-02 DIAGNOSIS — N390 Urinary tract infection, site not specified: Secondary | ICD-10-CM | POA: Diagnosis not present

## 2020-05-02 DIAGNOSIS — U071 COVID-19: Secondary | ICD-10-CM | POA: Diagnosis not present

## 2020-05-04 ENCOUNTER — Telehealth: Payer: Self-pay | Admitting: Family Medicine

## 2020-05-04 DIAGNOSIS — Z20828 Contact with and (suspected) exposure to other viral communicable diseases: Secondary | ICD-10-CM | POA: Diagnosis not present

## 2020-05-04 DIAGNOSIS — U071 COVID-19: Secondary | ICD-10-CM | POA: Diagnosis not present

## 2020-05-04 NOTE — Telephone Encounter (Signed)
Can not treat blindly  I can work her in Friday near 11:30 as back door visit If severe issues today ere or urgent care

## 2020-05-04 NOTE — Telephone Encounter (Signed)
[name] from [facility] calling and wanting to know if something can be called in for patient. Pt has scratchy throat and cough now and then. No fever, no shortness of breath, eating/drinking fine. [name] states that yesterday pt did not look like she felt good.  Please advise. Thank you  [facility]

## 2020-05-04 NOTE — Telephone Encounter (Signed)
Spoke with [name] at [facility]. They will bring pt in at 11:30 tomorrow morning.

## 2020-05-05 ENCOUNTER — Encounter: Payer: Self-pay | Admitting: Family Medicine

## 2020-05-05 ENCOUNTER — Ambulatory Visit (INDEPENDENT_AMBULATORY_CARE_PROVIDER_SITE_OTHER): Payer: Medicare Other | Admitting: Family Medicine

## 2020-05-05 ENCOUNTER — Other Ambulatory Visit: Payer: Self-pay

## 2020-05-05 VITALS — HR 99 | Temp 97.7°F

## 2020-05-05 DIAGNOSIS — R059 Cough, unspecified: Secondary | ICD-10-CM

## 2020-05-05 DIAGNOSIS — I700 Atherosclerosis of aorta: Secondary | ICD-10-CM | POA: Diagnosis not present

## 2020-05-05 DIAGNOSIS — J069 Acute upper respiratory infection, unspecified: Secondary | ICD-10-CM | POA: Diagnosis not present

## 2020-05-05 DIAGNOSIS — K5790 Diverticulosis of intestine, part unspecified, without perforation or abscess without bleeding: Secondary | ICD-10-CM | POA: Diagnosis not present

## 2020-05-05 DIAGNOSIS — E119 Type 2 diabetes mellitus without complications: Secondary | ICD-10-CM | POA: Diagnosis not present

## 2020-05-05 DIAGNOSIS — I10 Essential (primary) hypertension: Secondary | ICD-10-CM | POA: Diagnosis not present

## 2020-05-05 DIAGNOSIS — N390 Urinary tract infection, site not specified: Secondary | ICD-10-CM | POA: Diagnosis not present

## 2020-05-05 DIAGNOSIS — Z7984 Long term (current) use of oral hypoglycemic drugs: Secondary | ICD-10-CM | POA: Diagnosis not present

## 2020-05-05 MED ORDER — BENZONATATE 100 MG PO CAPS: 100.0000 mg | ORAL_CAPSULE | Freq: Three times a day (TID) | ORAL | 0 refills | Status: DC | PRN

## 2020-05-05 NOTE — Progress Notes (Signed)
   Subjective:    Patient ID: [name], female    DOB: [birth year], 86 y.o.   MRN: [id number]  Cough This is a new problem. Episode onset: 2 days. Associated symptoms include headaches, nasal congestion and a sore throat. She has tried OTC cough suppressant (tylenol) for the symptoms.   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   Respiratory: Positive for cough.   Neurological: Positive for headaches.       Objective:   Physical Exam  Her O2 saturation 97% no respiratory distress no wheezes patient is slightly hoarse head congestion noted ears are normal      Assessment & Plan:  I believe this is a viral illness it should take several days to gradually get better warning signs were discussed in detail if progressive troubles or problems follow-up

## 2020-05-07 DIAGNOSIS — E119 Type 2 diabetes mellitus without complications: Secondary | ICD-10-CM | POA: Diagnosis not present

## 2020-05-07 DIAGNOSIS — I700 Atherosclerosis of aorta: Secondary | ICD-10-CM | POA: Diagnosis not present

## 2020-05-07 DIAGNOSIS — N390 Urinary tract infection, site not specified: Secondary | ICD-10-CM | POA: Diagnosis not present

## 2020-05-07 DIAGNOSIS — Z9181 History of falling: Secondary | ICD-10-CM | POA: Diagnosis not present

## 2020-05-07 DIAGNOSIS — R2681 Unsteadiness on feet: Secondary | ICD-10-CM | POA: Diagnosis not present

## 2020-05-07 DIAGNOSIS — K449 Diaphragmatic hernia without obstruction or gangrene: Secondary | ICD-10-CM | POA: Diagnosis not present

## 2020-05-07 DIAGNOSIS — M6281 Muscle weakness (generalized): Secondary | ICD-10-CM | POA: Diagnosis not present

## 2020-05-07 DIAGNOSIS — E785 Hyperlipidemia, unspecified: Secondary | ICD-10-CM | POA: Diagnosis not present

## 2020-05-07 DIAGNOSIS — Z7984 Long term (current) use of oral hypoglycemic drugs: Secondary | ICD-10-CM | POA: Diagnosis not present

## 2020-05-07 DIAGNOSIS — K5790 Diverticulosis of intestine, part unspecified, without perforation or abscess without bleeding: Secondary | ICD-10-CM | POA: Diagnosis not present

## 2020-05-07 DIAGNOSIS — I10 Essential (primary) hypertension: Secondary | ICD-10-CM | POA: Diagnosis not present

## 2020-05-07 LAB — SPECIMEN STATUS REPORT

## 2020-05-07 LAB — SARS-COV-2, NAA 2 DAY TAT

## 2020-05-07 LAB — NOVEL CORONAVIRUS, NAA: SARS-CoV-2, NAA: NOT DETECTED

## 2020-05-08 ENCOUNTER — Other Ambulatory Visit: Payer: Self-pay | Admitting: *Deleted

## 2020-05-08 DIAGNOSIS — K5790 Diverticulosis of intestine, part unspecified, without perforation or abscess without bleeding: Secondary | ICD-10-CM | POA: Diagnosis not present

## 2020-05-08 DIAGNOSIS — E119 Type 2 diabetes mellitus without complications: Secondary | ICD-10-CM | POA: Diagnosis not present

## 2020-05-08 DIAGNOSIS — I700 Atherosclerosis of aorta: Secondary | ICD-10-CM | POA: Diagnosis not present

## 2020-05-08 DIAGNOSIS — I10 Essential (primary) hypertension: Secondary | ICD-10-CM | POA: Diagnosis not present

## 2020-05-08 DIAGNOSIS — Z7984 Long term (current) use of oral hypoglycemic drugs: Secondary | ICD-10-CM | POA: Diagnosis not present

## 2020-05-08 DIAGNOSIS — N390 Urinary tract infection, site not specified: Secondary | ICD-10-CM | POA: Diagnosis not present

## 2020-05-08 MED ORDER — HYDROCODONE-HOMATROPINE 5-1.5 MG/5ML PO SYRP: 5.0000 mL | ORAL_SOLUTION | Freq: Three times a day (TID) | ORAL | 0 refills | Status: DC | PRN

## 2020-05-08 NOTE — Telephone Encounter (Signed)
Silvia from [employer] called to give update on patient. Patient is feeling better but still has cough- Tessalon Perles help some but patient is requesting stronger cough med  [phone]

## 2020-05-08 NOTE — Telephone Encounter (Signed)
Hycodan 1 teaspoon every 8 hours as needed please pend, 75 mL, caution drowsiness

## 2020-05-08 NOTE — Telephone Encounter (Signed)
Rx signed.

## 2020-05-08 NOTE — Telephone Encounter (Signed)
Med pended. Will call [facility] after it is signed

## 2020-05-08 NOTE — Telephone Encounter (Signed)
Patient notified

## 2020-05-09 DIAGNOSIS — E119 Type 2 diabetes mellitus without complications: Secondary | ICD-10-CM | POA: Diagnosis not present

## 2020-05-09 DIAGNOSIS — N390 Urinary tract infection, site not specified: Secondary | ICD-10-CM | POA: Diagnosis not present

## 2020-05-09 DIAGNOSIS — I10 Essential (primary) hypertension: Secondary | ICD-10-CM | POA: Diagnosis not present

## 2020-05-09 DIAGNOSIS — Z7984 Long term (current) use of oral hypoglycemic drugs: Secondary | ICD-10-CM | POA: Diagnosis not present

## 2020-05-09 DIAGNOSIS — Z20828 Contact with and (suspected) exposure to other viral communicable diseases: Secondary | ICD-10-CM | POA: Diagnosis not present

## 2020-05-09 DIAGNOSIS — I700 Atherosclerosis of aorta: Secondary | ICD-10-CM | POA: Diagnosis not present

## 2020-05-09 DIAGNOSIS — U071 COVID-19: Secondary | ICD-10-CM | POA: Diagnosis not present

## 2020-05-09 DIAGNOSIS — K5790 Diverticulosis of intestine, part unspecified, without perforation or abscess without bleeding: Secondary | ICD-10-CM | POA: Diagnosis not present

## 2020-05-10 DIAGNOSIS — I700 Atherosclerosis of aorta: Secondary | ICD-10-CM | POA: Diagnosis not present

## 2020-05-10 DIAGNOSIS — K5790 Diverticulosis of intestine, part unspecified, without perforation or abscess without bleeding: Secondary | ICD-10-CM | POA: Diagnosis not present

## 2020-05-10 DIAGNOSIS — N390 Urinary tract infection, site not specified: Secondary | ICD-10-CM | POA: Diagnosis not present

## 2020-05-10 DIAGNOSIS — E119 Type 2 diabetes mellitus without complications: Secondary | ICD-10-CM | POA: Diagnosis not present

## 2020-05-10 DIAGNOSIS — Z7984 Long term (current) use of oral hypoglycemic drugs: Secondary | ICD-10-CM | POA: Diagnosis not present

## 2020-05-10 DIAGNOSIS — I10 Essential (primary) hypertension: Secondary | ICD-10-CM | POA: Diagnosis not present

## 2020-05-11 ENCOUNTER — Encounter: Payer: Self-pay | Admitting: Family Medicine

## 2020-05-11 ENCOUNTER — Other Ambulatory Visit: Payer: Self-pay

## 2020-05-11 ENCOUNTER — Ambulatory Visit (INDEPENDENT_AMBULATORY_CARE_PROVIDER_SITE_OTHER): Payer: Medicare Other | Admitting: Family Medicine

## 2020-05-11 ENCOUNTER — Telehealth: Payer: Self-pay | Admitting: Family Medicine

## 2020-05-11 VITALS — BP 138/66 | HR 100 | Temp 97.0°F | Wt 172.6 lb

## 2020-05-11 DIAGNOSIS — I10 Essential (primary) hypertension: Secondary | ICD-10-CM

## 2020-05-11 DIAGNOSIS — M791 Myalgia, unspecified site: Secondary | ICD-10-CM

## 2020-05-11 DIAGNOSIS — E785 Hyperlipidemia, unspecified: Secondary | ICD-10-CM

## 2020-05-11 DIAGNOSIS — T466X5A Adverse effect of antihyperlipidemic and antiarteriosclerotic drugs, initial encounter: Secondary | ICD-10-CM

## 2020-05-11 DIAGNOSIS — R54 Age-related physical debility: Secondary | ICD-10-CM | POA: Diagnosis not present

## 2020-05-11 DIAGNOSIS — Z20828 Contact with and (suspected) exposure to other viral communicable diseases: Secondary | ICD-10-CM | POA: Diagnosis not present

## 2020-05-11 DIAGNOSIS — E1169 Type 2 diabetes mellitus with other specified complication: Secondary | ICD-10-CM | POA: Diagnosis not present

## 2020-05-11 DIAGNOSIS — U071 COVID-19: Secondary | ICD-10-CM | POA: Diagnosis not present

## 2020-05-11 NOTE — Telephone Encounter (Signed)
Form was completed Please fill in administrative part Re: Office stamp Scanned copy into the system Fax to appropriate place thank you

## 2020-05-11 NOTE — Telephone Encounter (Signed)
Insurance forms from patient visit in provider office.

## 2020-05-11 NOTE — Progress Notes (Addendum)
   Subjective:    Patient ID: [name], female    DOB: May 18, 1932, 86 y.o.   MRN: [id number]  HPI Pt here to have insurance forms completed. Pt here with Amber from nursing center.   Pt states she is still coughing. Using cough drops during the day. Sometimes coughing up; laying down pt coughs a lot. Pt was prescribed Hycodan cough syrup. Tessalon Perles not helping much.   This patient stays in assisted living.  She is unable to take care of herself.  She fell onto very poor health when she was trying to take care of her self.  She has been in assisted living over the past many months.  She is done well with that.  They help deliver her medications and monitor her medicines.  In addition to this may assist her with prepping her food which she was unable to do.  They also assist her with dressing when necessary.  And they also assist her with bathing.  Patient's cognitive skills overall are doing fairly well.  Patient has had 2 falls in the past year related to balance issues.  She also suffers with weakness in her legs which makes difficulty walking long distances. Review of Systems  Constitutional: Negative for activity change and fever.  HENT: Positive for congestion and rhinorrhea. Negative for ear pain.   Eyes: Negative for discharge.  Respiratory: Positive for cough. Negative for shortness of breath and wheezing.   Cardiovascular: Negative for chest pain.  Patient does have cognitive dysfunction she scored 21 out of 30 on the [facility] cognitive assessment in September 2020.  Today she can recall 2 out of 3 items after distraction.  She can draw clock appropriately.  She cannot do spatial tasks well.  She does not do numbers or naming of items well     Objective:   Physical Exam Vitals and nursing note reviewed.  Constitutional:      Appearance: She is well-developed.  HENT:     Head: Normocephalic.     Nose: Nose normal.     Mouth/Throat:     Pharynx: No oropharyngeal exudate.   Cardiovascular:     Rate and Rhythm: Normal rate.     Heart sounds: Normal heart sounds. No murmur heard.   Pulmonary:     Effort: Pulmonary effort is normal.     Breath sounds: Normal breath sounds. No wheezing.  Musculoskeletal:     Cervical back: Neck supple.  Lymphadenopathy:     Cervical: No cervical adenopathy.  Skin:    General: Skin is warm and dry.           Assessment & Plan:  1. Essential hypertension Blood pressure under good control continue current measures.  Watch diet stay active  2. Hyperlipidemia associated with type 2 diabetes mellitus ([place]) Hyperlipidemia and diabetes under good control Metformin patient does not tolerate statins has myalgias related to them  3. Frail elderly Patient needs help with her bathing sometimes with clothing unable to do her own care This patient needs the help with keeping up with her medications as well as hygiene as well as food preparation and overall oversight.  She needs to be within assisted living and would not be able to do well on her own  Cognitive dysfunction please see above the discussion

## 2020-05-12 ENCOUNTER — Other Ambulatory Visit: Payer: Self-pay | Admitting: Family Medicine

## 2020-05-15 DIAGNOSIS — I10 Essential (primary) hypertension: Secondary | ICD-10-CM | POA: Diagnosis not present

## 2020-05-15 DIAGNOSIS — I700 Atherosclerosis of aorta: Secondary | ICD-10-CM | POA: Diagnosis not present

## 2020-05-15 DIAGNOSIS — Z7984 Long term (current) use of oral hypoglycemic drugs: Secondary | ICD-10-CM | POA: Diagnosis not present

## 2020-05-15 DIAGNOSIS — E119 Type 2 diabetes mellitus without complications: Secondary | ICD-10-CM | POA: Diagnosis not present

## 2020-05-15 DIAGNOSIS — K5790 Diverticulosis of intestine, part unspecified, without perforation or abscess without bleeding: Secondary | ICD-10-CM | POA: Diagnosis not present

## 2020-05-15 DIAGNOSIS — N390 Urinary tract infection, site not specified: Secondary | ICD-10-CM | POA: Diagnosis not present

## 2020-05-16 DIAGNOSIS — H353112 Nonexudative age-related macular degeneration, right eye, intermediate dry stage: Secondary | ICD-10-CM | POA: Diagnosis not present

## 2020-05-16 DIAGNOSIS — Z20828 Contact with and (suspected) exposure to other viral communicable diseases: Secondary | ICD-10-CM | POA: Diagnosis not present

## 2020-05-16 DIAGNOSIS — U071 COVID-19: Secondary | ICD-10-CM | POA: Diagnosis not present

## 2020-05-17 ENCOUNTER — Other Ambulatory Visit: Payer: Self-pay | Admitting: Family Medicine

## 2020-05-17 ENCOUNTER — Telehealth: Payer: Self-pay | Admitting: *Deleted

## 2020-05-17 DIAGNOSIS — N390 Urinary tract infection, site not specified: Secondary | ICD-10-CM | POA: Diagnosis not present

## 2020-05-17 DIAGNOSIS — E119 Type 2 diabetes mellitus without complications: Secondary | ICD-10-CM | POA: Diagnosis not present

## 2020-05-17 DIAGNOSIS — K5790 Diverticulosis of intestine, part unspecified, without perforation or abscess without bleeding: Secondary | ICD-10-CM | POA: Diagnosis not present

## 2020-05-17 DIAGNOSIS — Z7984 Long term (current) use of oral hypoglycemic drugs: Secondary | ICD-10-CM | POA: Diagnosis not present

## 2020-05-17 DIAGNOSIS — I10 Essential (primary) hypertension: Secondary | ICD-10-CM | POA: Diagnosis not present

## 2020-05-17 DIAGNOSIS — I700 Atherosclerosis of aorta: Secondary | ICD-10-CM | POA: Diagnosis not present

## 2020-05-17 NOTE — Telephone Encounter (Signed)
Sylvia at [facility] and verbalized understanding. Order for Bp checks was faxed to [facility] @ [phone]

## 2020-05-17 NOTE — Telephone Encounter (Signed)
[name] called wanting to update you on this pt- cough has improved since visit last week and she had started to feel slightly better. Today bp 160/62 and just not feeling well- no complaints of anything specific. No fever, no sob. Staff notified that if anything changes or worsening pt needs to be seen.

## 2020-05-17 NOTE — Telephone Encounter (Signed)
Check blood pressures periodically a few times per week send [place] some readings within the next 2 weeks follow-up sooner if feeling worse patient has a recheck in mid December but we can see her sooner if she is feeling worse thank you

## 2020-05-18 DIAGNOSIS — U071 COVID-19: Secondary | ICD-10-CM | POA: Diagnosis not present

## 2020-05-18 DIAGNOSIS — Z20828 Contact with and (suspected) exposure to other viral communicable diseases: Secondary | ICD-10-CM | POA: Diagnosis not present

## 2020-05-19 NOTE — Telephone Encounter (Signed)
Nurses Please check with pharmacy. Is this cough medication controlled?  In other words does it have a narcotic as an ingredient? Not sure why I am receiving another request when she was just given hydrocodone cough medicine several days ago?  Ideally I do not want this patient on ongoing cough medicine

## 2020-05-21 DIAGNOSIS — I700 Atherosclerosis of aorta: Secondary | ICD-10-CM | POA: Diagnosis not present

## 2020-05-21 DIAGNOSIS — E119 Type 2 diabetes mellitus without complications: Secondary | ICD-10-CM | POA: Diagnosis not present

## 2020-05-21 DIAGNOSIS — N390 Urinary tract infection, site not specified: Secondary | ICD-10-CM | POA: Diagnosis not present

## 2020-05-21 DIAGNOSIS — K5790 Diverticulosis of intestine, part unspecified, without perforation or abscess without bleeding: Secondary | ICD-10-CM | POA: Diagnosis not present

## 2020-05-21 DIAGNOSIS — I10 Essential (primary) hypertension: Secondary | ICD-10-CM | POA: Diagnosis not present

## 2020-05-21 DIAGNOSIS — Z7984 Long term (current) use of oral hypoglycemic drugs: Secondary | ICD-10-CM | POA: Diagnosis not present

## 2020-05-23 DIAGNOSIS — Z20828 Contact with and (suspected) exposure to other viral communicable diseases: Secondary | ICD-10-CM | POA: Diagnosis not present

## 2020-05-23 DIAGNOSIS — U071 COVID-19: Secondary | ICD-10-CM | POA: Diagnosis not present

## 2020-05-30 DIAGNOSIS — U071 COVID-19: Secondary | ICD-10-CM | POA: Diagnosis not present

## 2020-05-30 DIAGNOSIS — Z20828 Contact with and (suspected) exposure to other viral communicable diseases: Secondary | ICD-10-CM | POA: Diagnosis not present

## 2020-06-01 DIAGNOSIS — U071 COVID-19: Secondary | ICD-10-CM | POA: Diagnosis not present

## 2020-06-01 DIAGNOSIS — Z20828 Contact with and (suspected) exposure to other viral communicable diseases: Secondary | ICD-10-CM | POA: Diagnosis not present

## 2020-06-01 DIAGNOSIS — I700 Atherosclerosis of aorta: Secondary | ICD-10-CM | POA: Diagnosis not present

## 2020-06-01 DIAGNOSIS — E119 Type 2 diabetes mellitus without complications: Secondary | ICD-10-CM | POA: Diagnosis not present

## 2020-06-01 DIAGNOSIS — I10 Essential (primary) hypertension: Secondary | ICD-10-CM | POA: Diagnosis not present

## 2020-06-01 DIAGNOSIS — Z7984 Long term (current) use of oral hypoglycemic drugs: Secondary | ICD-10-CM | POA: Diagnosis not present

## 2020-06-01 DIAGNOSIS — N390 Urinary tract infection, site not specified: Secondary | ICD-10-CM | POA: Diagnosis not present

## 2020-06-01 DIAGNOSIS — K5790 Diverticulosis of intestine, part unspecified, without perforation or abscess without bleeding: Secondary | ICD-10-CM | POA: Diagnosis not present

## 2020-06-02 DIAGNOSIS — B351 Tinea unguium: Secondary | ICD-10-CM | POA: Diagnosis not present

## 2020-06-02 DIAGNOSIS — E1142 Type 2 diabetes mellitus with diabetic polyneuropathy: Secondary | ICD-10-CM | POA: Diagnosis not present

## 2020-06-02 DIAGNOSIS — L851 Acquired keratosis [keratoderma] palmaris et plantaris: Secondary | ICD-10-CM | POA: Diagnosis not present

## 2020-06-06 DIAGNOSIS — I10 Essential (primary) hypertension: Secondary | ICD-10-CM | POA: Diagnosis not present

## 2020-06-06 DIAGNOSIS — U071 COVID-19: Secondary | ICD-10-CM | POA: Diagnosis not present

## 2020-06-06 DIAGNOSIS — Z9181 History of falling: Secondary | ICD-10-CM | POA: Diagnosis not present

## 2020-06-06 DIAGNOSIS — Z7984 Long term (current) use of oral hypoglycemic drugs: Secondary | ICD-10-CM | POA: Diagnosis not present

## 2020-06-06 DIAGNOSIS — E785 Hyperlipidemia, unspecified: Secondary | ICD-10-CM | POA: Diagnosis not present

## 2020-06-06 DIAGNOSIS — E119 Type 2 diabetes mellitus without complications: Secondary | ICD-10-CM | POA: Diagnosis not present

## 2020-06-06 DIAGNOSIS — K449 Diaphragmatic hernia without obstruction or gangrene: Secondary | ICD-10-CM | POA: Diagnosis not present

## 2020-06-06 DIAGNOSIS — E1169 Type 2 diabetes mellitus with other specified complication: Secondary | ICD-10-CM | POA: Diagnosis not present

## 2020-06-06 DIAGNOSIS — I700 Atherosclerosis of aorta: Secondary | ICD-10-CM | POA: Diagnosis not present

## 2020-06-06 DIAGNOSIS — R2681 Unsteadiness on feet: Secondary | ICD-10-CM | POA: Diagnosis not present

## 2020-06-06 DIAGNOSIS — M6281 Muscle weakness (generalized): Secondary | ICD-10-CM | POA: Diagnosis not present

## 2020-06-06 DIAGNOSIS — K5790 Diverticulosis of intestine, part unspecified, without perforation or abscess without bleeding: Secondary | ICD-10-CM | POA: Diagnosis not present

## 2020-06-06 DIAGNOSIS — Z20828 Contact with and (suspected) exposure to other viral communicable diseases: Secondary | ICD-10-CM | POA: Diagnosis not present

## 2020-06-07 DIAGNOSIS — M6281 Muscle weakness (generalized): Secondary | ICD-10-CM | POA: Diagnosis not present

## 2020-06-07 DIAGNOSIS — I10 Essential (primary) hypertension: Secondary | ICD-10-CM | POA: Diagnosis not present

## 2020-06-07 DIAGNOSIS — R2681 Unsteadiness on feet: Secondary | ICD-10-CM | POA: Diagnosis not present

## 2020-06-07 DIAGNOSIS — K5790 Diverticulosis of intestine, part unspecified, without perforation or abscess without bleeding: Secondary | ICD-10-CM | POA: Diagnosis not present

## 2020-06-07 DIAGNOSIS — E119 Type 2 diabetes mellitus without complications: Secondary | ICD-10-CM | POA: Diagnosis not present

## 2020-06-07 DIAGNOSIS — I700 Atherosclerosis of aorta: Secondary | ICD-10-CM | POA: Diagnosis not present

## 2020-06-07 LAB — BASIC METABOLIC PANEL
BUN/Creatinine Ratio: 20 (ref 12–28)
BUN: 18 mg/dL (ref 8–27)
CO2: 24 mmol/L (ref 20–29)
Calcium: 9.8 mg/dL (ref 8.7–10.3)
Chloride: 94 mmol/L — ABNORMAL LOW (ref 96–106)
Creatinine, Ser: 0.88 mg/dL (ref 0.57–1.00)
GFR calc Af Amer: 69 mL/min/{1.73_m2} (ref >59)
GFR calc non Af Amer: 60 mL/min/{1.73_m2} (ref >59)
Glucose: 115 mg/dL — ABNORMAL HIGH (ref 65–99)
Potassium: 4.2 mmol/L (ref 3.5–5.2)
Sodium: 136 mmol/L (ref 134–144)

## 2020-06-07 LAB — HEMOGLOBIN A1C
Est. average glucose Bld gHb Est-mCnc: 146 mg/dL
Hgb A1c MFr Bld: 6.7 % — ABNORMAL HIGH (ref 4.8–5.6)

## 2020-06-08 ENCOUNTER — Telehealth: Payer: Self-pay | Admitting: Family Medicine

## 2020-06-08 DIAGNOSIS — U071 COVID-19: Secondary | ICD-10-CM | POA: Diagnosis not present

## 2020-06-08 DIAGNOSIS — K5790 Diverticulosis of intestine, part unspecified, without perforation or abscess without bleeding: Secondary | ICD-10-CM | POA: Diagnosis not present

## 2020-06-08 DIAGNOSIS — E119 Type 2 diabetes mellitus without complications: Secondary | ICD-10-CM | POA: Diagnosis not present

## 2020-06-08 DIAGNOSIS — R2681 Unsteadiness on feet: Secondary | ICD-10-CM | POA: Diagnosis not present

## 2020-06-08 DIAGNOSIS — Z20828 Contact with and (suspected) exposure to other viral communicable diseases: Secondary | ICD-10-CM | POA: Diagnosis not present

## 2020-06-08 DIAGNOSIS — M6281 Muscle weakness (generalized): Secondary | ICD-10-CM | POA: Diagnosis not present

## 2020-06-08 DIAGNOSIS — I10 Essential (primary) hypertension: Secondary | ICD-10-CM | POA: Diagnosis not present

## 2020-06-08 DIAGNOSIS — I700 Atherosclerosis of aorta: Secondary | ICD-10-CM | POA: Diagnosis not present

## 2020-06-08 NOTE — Telephone Encounter (Signed)
Patient had extensive amount of blood pressures checked over several dates in late November and early December initially 137/68, then 148/68, 156/62, 142/67, 146/57 patient has follow-up office visit coming up no other intervention necessary until that visit

## 2020-06-12 DIAGNOSIS — E119 Type 2 diabetes mellitus without complications: Secondary | ICD-10-CM | POA: Diagnosis not present

## 2020-06-12 DIAGNOSIS — M6281 Muscle weakness (generalized): Secondary | ICD-10-CM | POA: Diagnosis not present

## 2020-06-12 DIAGNOSIS — I700 Atherosclerosis of aorta: Secondary | ICD-10-CM | POA: Diagnosis not present

## 2020-06-12 DIAGNOSIS — I10 Essential (primary) hypertension: Secondary | ICD-10-CM | POA: Diagnosis not present

## 2020-06-12 DIAGNOSIS — K5790 Diverticulosis of intestine, part unspecified, without perforation or abscess without bleeding: Secondary | ICD-10-CM | POA: Diagnosis not present

## 2020-06-12 DIAGNOSIS — R2681 Unsteadiness on feet: Secondary | ICD-10-CM | POA: Diagnosis not present

## 2020-06-13 ENCOUNTER — Ambulatory Visit (INDEPENDENT_AMBULATORY_CARE_PROVIDER_SITE_OTHER): Payer: Medicare Other | Admitting: Family Medicine

## 2020-06-13 ENCOUNTER — Encounter: Payer: Self-pay | Admitting: Family Medicine

## 2020-06-13 VITALS — BP 138/58 | HR 83 | Temp 97.9°F | Wt 174.6 lb

## 2020-06-13 DIAGNOSIS — Z20828 Contact with and (suspected) exposure to other viral communicable diseases: Secondary | ICD-10-CM | POA: Diagnosis not present

## 2020-06-13 DIAGNOSIS — U071 COVID-19: Secondary | ICD-10-CM | POA: Diagnosis not present

## 2020-06-13 DIAGNOSIS — E785 Hyperlipidemia, unspecified: Secondary | ICD-10-CM

## 2020-06-13 DIAGNOSIS — E1169 Type 2 diabetes mellitus with other specified complication: Secondary | ICD-10-CM | POA: Diagnosis not present

## 2020-06-13 DIAGNOSIS — I10 Essential (primary) hypertension: Secondary | ICD-10-CM | POA: Diagnosis not present

## 2020-06-13 DIAGNOSIS — M791 Myalgia, unspecified site: Secondary | ICD-10-CM | POA: Diagnosis not present

## 2020-06-13 NOTE — Progress Notes (Signed)
   Subjective:    Patient ID: [name], female    DOB: Oct 29, 1931, 86 y.o.   MRN: [id number]  Diabetes She presents for her follow-up diabetic visit. She has type 2 diabetes mellitus. Pertinent negatives for hypoglycemia include no confusion or dizziness. Pertinent negatives for diabetes include no chest pain, no fatigue, no polydipsia, no polyphagia and no weakness. Current diabetic treatments: metformin.  Hypertension This is a chronic problem. Pertinent negatives include no chest pain or shortness of breath. Treatments tried: amlodipine, losartan.      Review of Systems  Constitutional: Negative for activity change, appetite change and fatigue.  HENT: Negative for congestion and rhinorrhea.   Respiratory: Negative for cough and shortness of breath.   Cardiovascular: Negative for chest pain and leg swelling.  Gastrointestinal: Negative for abdominal pain and diarrhea.  Endocrine: Negative for polydipsia and polyphagia.  Skin: Negative for color change.  Neurological: Negative for dizziness and weakness.  Psychiatric/Behavioral: Negative for behavioral problems and confusion.       Objective:   Physical Exam Vitals reviewed.  Constitutional:      General: She is not in acute distress.    Appearance: She is well-nourished.  HENT:     Head: Normocephalic and atraumatic.  Eyes:     General:        Right eye: No discharge.        Left eye: No discharge.  Neck:     Trachea: No tracheal deviation.  Cardiovascular:     Rate and Rhythm: Normal rate and regular rhythm.     Heart sounds: Normal heart sounds. No murmur heard.   Pulmonary:     Effort: Pulmonary effort is normal. No respiratory distress.     Breath sounds: Normal breath sounds.  Musculoskeletal:        General: No edema.  Lymphadenopathy:     Cervical: No cervical adenopathy.  Skin:    General: Skin is warm and dry.  Neurological:     Mental Status: She is alert.     Coordination: Coordination normal.   Psychiatric:        Mood and Affect: Mood and affect normal.        Behavior: Behavior normal.    Patient had lab work completed A1c was 6.7 Creatinine looks good at 0.88 GFR looks good.  Sodium potassium looks good.  Continue Metformin as is Previous lipid panel in August shows LDL 124      Assessment & Plan:  1. Essential hypertension Blood pressure is under good control watch salt diet stay physically active continue current medication I would not bump up the dose because her bottom number is very low  2. Hyperlipidemia associated with type 2 diabetes mellitus ([place]) Diabetes under good control A1c below 7 continue current measures minimize excessive starches and sugars Cholesterol overall borderline on previous lab work but statins not tolerated by this patient due to myalgias  3. Myalgia due to statin See above hold off on any statins Patient needs to get her Covid booster Moods overall are doing good.

## 2020-06-14 DIAGNOSIS — M6281 Muscle weakness (generalized): Secondary | ICD-10-CM | POA: Diagnosis not present

## 2020-06-14 DIAGNOSIS — E119 Type 2 diabetes mellitus without complications: Secondary | ICD-10-CM | POA: Diagnosis not present

## 2020-06-14 DIAGNOSIS — I700 Atherosclerosis of aorta: Secondary | ICD-10-CM | POA: Diagnosis not present

## 2020-06-14 DIAGNOSIS — I10 Essential (primary) hypertension: Secondary | ICD-10-CM | POA: Diagnosis not present

## 2020-06-14 DIAGNOSIS — R2681 Unsteadiness on feet: Secondary | ICD-10-CM | POA: Diagnosis not present

## 2020-06-14 DIAGNOSIS — K5790 Diverticulosis of intestine, part unspecified, without perforation or abscess without bleeding: Secondary | ICD-10-CM | POA: Diagnosis not present

## 2020-06-15 DIAGNOSIS — Z20828 Contact with and (suspected) exposure to other viral communicable diseases: Secondary | ICD-10-CM | POA: Diagnosis not present

## 2020-06-15 DIAGNOSIS — U071 COVID-19: Secondary | ICD-10-CM | POA: Diagnosis not present

## 2020-06-18 DIAGNOSIS — M6281 Muscle weakness (generalized): Secondary | ICD-10-CM | POA: Diagnosis not present

## 2020-06-18 DIAGNOSIS — I10 Essential (primary) hypertension: Secondary | ICD-10-CM | POA: Diagnosis not present

## 2020-06-18 DIAGNOSIS — K5790 Diverticulosis of intestine, part unspecified, without perforation or abscess without bleeding: Secondary | ICD-10-CM | POA: Diagnosis not present

## 2020-06-18 DIAGNOSIS — I700 Atherosclerosis of aorta: Secondary | ICD-10-CM | POA: Diagnosis not present

## 2020-06-18 DIAGNOSIS — R2681 Unsteadiness on feet: Secondary | ICD-10-CM | POA: Diagnosis not present

## 2020-06-18 DIAGNOSIS — E119 Type 2 diabetes mellitus without complications: Secondary | ICD-10-CM | POA: Diagnosis not present

## 2020-06-19 ENCOUNTER — Other Ambulatory Visit: Payer: Self-pay | Admitting: Family Medicine

## 2020-06-19 NOTE — Telephone Encounter (Signed)
5 rf 

## 2020-06-20 ENCOUNTER — Ambulatory Visit: Payer: Medicare Other | Admitting: Family Medicine

## 2020-06-20 DIAGNOSIS — M6281 Muscle weakness (generalized): Secondary | ICD-10-CM | POA: Diagnosis not present

## 2020-06-20 DIAGNOSIS — E119 Type 2 diabetes mellitus without complications: Secondary | ICD-10-CM | POA: Diagnosis not present

## 2020-06-20 DIAGNOSIS — U071 COVID-19: Secondary | ICD-10-CM | POA: Diagnosis not present

## 2020-06-20 DIAGNOSIS — I700 Atherosclerosis of aorta: Secondary | ICD-10-CM | POA: Diagnosis not present

## 2020-06-20 DIAGNOSIS — R2681 Unsteadiness on feet: Secondary | ICD-10-CM | POA: Diagnosis not present

## 2020-06-20 DIAGNOSIS — I10 Essential (primary) hypertension: Secondary | ICD-10-CM | POA: Diagnosis not present

## 2020-06-20 DIAGNOSIS — Z20828 Contact with and (suspected) exposure to other viral communicable diseases: Secondary | ICD-10-CM | POA: Diagnosis not present

## 2020-06-20 DIAGNOSIS — K5790 Diverticulosis of intestine, part unspecified, without perforation or abscess without bleeding: Secondary | ICD-10-CM | POA: Diagnosis not present

## 2020-06-22 DIAGNOSIS — M6281 Muscle weakness (generalized): Secondary | ICD-10-CM | POA: Diagnosis not present

## 2020-06-22 DIAGNOSIS — Z20828 Contact with and (suspected) exposure to other viral communicable diseases: Secondary | ICD-10-CM | POA: Diagnosis not present

## 2020-06-22 DIAGNOSIS — I10 Essential (primary) hypertension: Secondary | ICD-10-CM | POA: Diagnosis not present

## 2020-06-22 DIAGNOSIS — U071 COVID-19: Secondary | ICD-10-CM | POA: Diagnosis not present

## 2020-06-22 DIAGNOSIS — I700 Atherosclerosis of aorta: Secondary | ICD-10-CM | POA: Diagnosis not present

## 2020-06-22 DIAGNOSIS — R2681 Unsteadiness on feet: Secondary | ICD-10-CM | POA: Diagnosis not present

## 2020-06-27 DIAGNOSIS — Z20828 Contact with and (suspected) exposure to other viral communicable diseases: Secondary | ICD-10-CM | POA: Diagnosis not present

## 2020-06-27 DIAGNOSIS — U071 COVID-19: Secondary | ICD-10-CM | POA: Diagnosis not present

## 2020-06-29 DIAGNOSIS — Z20828 Contact with and (suspected) exposure to other viral communicable diseases: Secondary | ICD-10-CM | POA: Diagnosis not present

## 2020-06-29 DIAGNOSIS — U071 COVID-19: Secondary | ICD-10-CM | POA: Diagnosis not present

## 2020-07-04 ENCOUNTER — Telehealth: Payer: Self-pay | Admitting: Family Medicine

## 2020-07-04 DIAGNOSIS — U071 COVID-19: Secondary | ICD-10-CM | POA: Diagnosis not present

## 2020-07-04 DIAGNOSIS — Z20828 Contact with and (suspected) exposure to other viral communicable diseases: Secondary | ICD-10-CM | POA: Diagnosis not present

## 2020-07-04 NOTE — Telephone Encounter (Signed)
Please go ahead with this write it out I will sign it on a prescription then fax thank you

## 2020-07-04 NOTE — Telephone Encounter (Signed)
[facility] calling to request a prescription for elevated toilet seat. Pt is having trouble getting up from toilet seat due to knee pain.  [place]. Please advise. Thank you

## 2020-07-04 NOTE — Telephone Encounter (Signed)
Script written and placed on door to sign. Will fax after signing.

## 2020-07-06 DIAGNOSIS — U071 COVID-19: Secondary | ICD-10-CM | POA: Diagnosis not present

## 2020-07-06 DIAGNOSIS — Z20828 Contact with and (suspected) exposure to other viral communicable diseases: Secondary | ICD-10-CM | POA: Diagnosis not present

## 2020-07-11 ENCOUNTER — Other Ambulatory Visit: Payer: Self-pay | Admitting: Family Medicine

## 2020-07-11 DIAGNOSIS — U071 COVID-19: Secondary | ICD-10-CM | POA: Diagnosis not present

## 2020-07-11 DIAGNOSIS — Z20828 Contact with and (suspected) exposure to other viral communicable diseases: Secondary | ICD-10-CM | POA: Diagnosis not present

## 2020-07-13 DIAGNOSIS — Z20828 Contact with and (suspected) exposure to other viral communicable diseases: Secondary | ICD-10-CM | POA: Diagnosis not present

## 2020-07-13 DIAGNOSIS — U071 COVID-19: Secondary | ICD-10-CM | POA: Diagnosis not present

## 2020-07-14 ENCOUNTER — Other Ambulatory Visit: Payer: Self-pay | Admitting: Family Medicine

## 2020-07-18 DIAGNOSIS — U071 COVID-19: Secondary | ICD-10-CM | POA: Diagnosis not present

## 2020-07-18 DIAGNOSIS — Z20828 Contact with and (suspected) exposure to other viral communicable diseases: Secondary | ICD-10-CM | POA: Diagnosis not present

## 2020-07-20 DIAGNOSIS — U071 COVID-19: Secondary | ICD-10-CM | POA: Diagnosis not present

## 2020-07-20 DIAGNOSIS — Z20828 Contact with and (suspected) exposure to other viral communicable diseases: Secondary | ICD-10-CM | POA: Diagnosis not present

## 2020-07-21 DIAGNOSIS — U071 COVID-19: Secondary | ICD-10-CM | POA: Diagnosis not present

## 2020-07-25 DIAGNOSIS — Z20828 Contact with and (suspected) exposure to other viral communicable diseases: Secondary | ICD-10-CM | POA: Diagnosis not present

## 2020-07-25 DIAGNOSIS — U071 COVID-19: Secondary | ICD-10-CM | POA: Diagnosis not present

## 2020-07-27 DIAGNOSIS — U071 COVID-19: Secondary | ICD-10-CM | POA: Diagnosis not present

## 2020-07-27 DIAGNOSIS — Z20828 Contact with and (suspected) exposure to other viral communicable diseases: Secondary | ICD-10-CM | POA: Diagnosis not present

## 2020-08-01 DIAGNOSIS — Z20828 Contact with and (suspected) exposure to other viral communicable diseases: Secondary | ICD-10-CM | POA: Diagnosis not present

## 2020-08-01 DIAGNOSIS — U071 COVID-19: Secondary | ICD-10-CM | POA: Diagnosis not present

## 2020-08-03 DIAGNOSIS — Z20828 Contact with and (suspected) exposure to other viral communicable diseases: Secondary | ICD-10-CM | POA: Diagnosis not present

## 2020-08-03 DIAGNOSIS — U071 COVID-19: Secondary | ICD-10-CM | POA: Diagnosis not present

## 2020-08-08 DIAGNOSIS — Z20828 Contact with and (suspected) exposure to other viral communicable diseases: Secondary | ICD-10-CM | POA: Diagnosis not present

## 2020-08-08 DIAGNOSIS — U071 COVID-19: Secondary | ICD-10-CM | POA: Diagnosis not present

## 2020-08-10 DIAGNOSIS — Z20828 Contact with and (suspected) exposure to other viral communicable diseases: Secondary | ICD-10-CM | POA: Diagnosis not present

## 2020-08-10 DIAGNOSIS — U071 COVID-19: Secondary | ICD-10-CM | POA: Diagnosis not present

## 2020-08-11 DIAGNOSIS — Z23 Encounter for immunization: Secondary | ICD-10-CM | POA: Diagnosis not present

## 2020-08-15 DIAGNOSIS — Z20828 Contact with and (suspected) exposure to other viral communicable diseases: Secondary | ICD-10-CM | POA: Diagnosis not present

## 2020-08-15 DIAGNOSIS — U071 COVID-19: Secondary | ICD-10-CM | POA: Diagnosis not present

## 2020-08-17 DIAGNOSIS — U071 COVID-19: Secondary | ICD-10-CM | POA: Diagnosis not present

## 2020-08-17 DIAGNOSIS — Z20828 Contact with and (suspected) exposure to other viral communicable diseases: Secondary | ICD-10-CM | POA: Diagnosis not present

## 2020-08-22 DIAGNOSIS — Z20828 Contact with and (suspected) exposure to other viral communicable diseases: Secondary | ICD-10-CM | POA: Diagnosis not present

## 2020-08-22 DIAGNOSIS — U071 COVID-19: Secondary | ICD-10-CM | POA: Diagnosis not present

## 2020-08-24 DIAGNOSIS — U071 COVID-19: Secondary | ICD-10-CM | POA: Diagnosis not present

## 2020-08-24 DIAGNOSIS — Z20828 Contact with and (suspected) exposure to other viral communicable diseases: Secondary | ICD-10-CM | POA: Diagnosis not present

## 2020-08-29 DIAGNOSIS — U071 COVID-19: Secondary | ICD-10-CM | POA: Diagnosis not present

## 2020-08-29 DIAGNOSIS — Z20828 Contact with and (suspected) exposure to other viral communicable diseases: Secondary | ICD-10-CM | POA: Diagnosis not present

## 2020-08-31 DIAGNOSIS — Z20828 Contact with and (suspected) exposure to other viral communicable diseases: Secondary | ICD-10-CM | POA: Diagnosis not present

## 2020-08-31 DIAGNOSIS — U071 COVID-19: Secondary | ICD-10-CM | POA: Diagnosis not present

## 2020-09-05 DIAGNOSIS — Z20828 Contact with and (suspected) exposure to other viral communicable diseases: Secondary | ICD-10-CM | POA: Diagnosis not present

## 2020-09-05 DIAGNOSIS — U071 COVID-19: Secondary | ICD-10-CM | POA: Diagnosis not present

## 2020-09-06 ENCOUNTER — Other Ambulatory Visit: Payer: Self-pay | Admitting: Family Medicine

## 2020-09-07 ENCOUNTER — Other Ambulatory Visit: Payer: Self-pay | Admitting: Family Medicine

## 2020-09-12 ENCOUNTER — Telehealth: Payer: Self-pay | Admitting: Family Medicine

## 2020-09-12 DIAGNOSIS — Z20828 Contact with and (suspected) exposure to other viral communicable diseases: Secondary | ICD-10-CM | POA: Diagnosis not present

## 2020-09-12 DIAGNOSIS — U071 COVID-19: Secondary | ICD-10-CM | POA: Diagnosis not present

## 2020-09-12 NOTE — Telephone Encounter (Signed)
Butch Penny from [facility] calling to get a D/C order for Hycodan cough syrup. Butch Penny states that a script was sent in last night and Butch Penny was filing it. Butch Penny called [facility] and they spoke with patient; pt states the cost is to expensive so she would like it discontinued. Please advise. Thank you

## 2020-09-12 NOTE — Telephone Encounter (Signed)
May discontinue the medication.(The only reason we ordered it was because they requested a refill)

## 2020-09-12 NOTE — Telephone Encounter (Signed)
D/c order on dr scott's door to sign. Await signature

## 2020-09-19 DIAGNOSIS — U071 COVID-19: Secondary | ICD-10-CM | POA: Diagnosis not present

## 2020-09-19 DIAGNOSIS — Z20828 Contact with and (suspected) exposure to other viral communicable diseases: Secondary | ICD-10-CM | POA: Diagnosis not present

## 2020-09-28 DIAGNOSIS — U071 COVID-19: Secondary | ICD-10-CM | POA: Diagnosis not present

## 2020-09-28 DIAGNOSIS — Z20828 Contact with and (suspected) exposure to other viral communicable diseases: Secondary | ICD-10-CM | POA: Diagnosis not present

## 2020-10-03 DIAGNOSIS — Z20828 Contact with and (suspected) exposure to other viral communicable diseases: Secondary | ICD-10-CM | POA: Diagnosis not present

## 2020-10-03 DIAGNOSIS — U071 COVID-19: Secondary | ICD-10-CM | POA: Diagnosis not present

## 2020-10-05 ENCOUNTER — Telehealth: Payer: Self-pay

## 2020-10-05 NOTE — Telephone Encounter (Signed)
Last labs 06/06/20 a1c and bmp

## 2020-10-05 NOTE — Telephone Encounter (Signed)
Sunday Spillers contacted and verbalized understanding.

## 2020-10-05 NOTE — Telephone Encounter (Signed)
No lab work needed ahead of time thanks not for this visit

## 2020-10-05 NOTE — Telephone Encounter (Signed)
[name] called and wanted to know if she will need blood work ordered before 04/18 appt? She said you call [facility] and asked to speak to [facility]

## 2020-10-10 DIAGNOSIS — U071 COVID-19: Secondary | ICD-10-CM | POA: Diagnosis not present

## 2020-10-10 DIAGNOSIS — E1142 Type 2 diabetes mellitus with diabetic polyneuropathy: Secondary | ICD-10-CM | POA: Diagnosis not present

## 2020-10-10 DIAGNOSIS — L84 Corns and callosities: Secondary | ICD-10-CM | POA: Diagnosis not present

## 2020-10-10 DIAGNOSIS — Z20828 Contact with and (suspected) exposure to other viral communicable diseases: Secondary | ICD-10-CM | POA: Diagnosis not present

## 2020-10-10 DIAGNOSIS — B351 Tinea unguium: Secondary | ICD-10-CM | POA: Diagnosis not present

## 2020-10-12 ENCOUNTER — Ambulatory Visit: Payer: Medicare Other | Admitting: Family Medicine

## 2020-10-16 ENCOUNTER — Encounter: Payer: Self-pay | Admitting: Family Medicine

## 2020-10-16 ENCOUNTER — Other Ambulatory Visit: Payer: Self-pay

## 2020-10-16 ENCOUNTER — Ambulatory Visit (INDEPENDENT_AMBULATORY_CARE_PROVIDER_SITE_OTHER): Payer: Medicare Other | Admitting: Family Medicine

## 2020-10-16 VITALS — BP 124/62 | HR 77 | Temp 97.4°F | Ht 68.0 in | Wt 176.0 lb

## 2020-10-16 DIAGNOSIS — M791 Myalgia, unspecified site: Secondary | ICD-10-CM | POA: Diagnosis not present

## 2020-10-16 DIAGNOSIS — E119 Type 2 diabetes mellitus without complications: Secondary | ICD-10-CM

## 2020-10-16 DIAGNOSIS — G4709 Other insomnia: Secondary | ICD-10-CM | POA: Diagnosis not present

## 2020-10-16 DIAGNOSIS — E785 Hyperlipidemia, unspecified: Secondary | ICD-10-CM | POA: Diagnosis not present

## 2020-10-16 DIAGNOSIS — K219 Gastro-esophageal reflux disease without esophagitis: Secondary | ICD-10-CM | POA: Diagnosis not present

## 2020-10-16 DIAGNOSIS — F411 Generalized anxiety disorder: Secondary | ICD-10-CM | POA: Diagnosis not present

## 2020-10-16 DIAGNOSIS — I10 Essential (primary) hypertension: Secondary | ICD-10-CM

## 2020-10-16 DIAGNOSIS — E1169 Type 2 diabetes mellitus with other specified complication: Secondary | ICD-10-CM

## 2020-10-16 DIAGNOSIS — T466X5A Adverse effect of antihyperlipidemic and antiarteriosclerotic drugs, initial encounter: Secondary | ICD-10-CM

## 2020-10-16 MED ORDER — AMLODIPINE BESYLATE 10 MG PO TABS: 10.0000 mg | ORAL_TABLET | Freq: Every day | ORAL | 5 refills | Status: DC

## 2020-10-16 MED ORDER — ALPRAZOLAM 0.25 MG PO TABS: ORAL_TABLET | ORAL | 5 refills | Status: DC

## 2020-10-16 MED ORDER — METFORMIN HCL 500 MG PO TABS: ORAL_TABLET | ORAL | 6 refills | Status: DC

## 2020-10-16 MED ORDER — HYDRALAZINE HCL 25 MG PO TABS: 25.0000 mg | ORAL_TABLET | Freq: Two times a day (BID) | ORAL | 5 refills | Status: DC

## 2020-10-16 MED ORDER — HYDROCHLOROTHIAZIDE 25 MG PO TABS: 1.0000 | ORAL_TABLET | Freq: Every day | ORAL | 5 refills | Status: DC

## 2020-10-16 MED ORDER — LOSARTAN POTASSIUM 50 MG PO TABS: ORAL_TABLET | ORAL | 5 refills | Status: DC

## 2020-10-16 MED ORDER — PANTOPRAZOLE SODIUM 20 MG PO TBEC: DELAYED_RELEASE_TABLET | ORAL | 5 refills | Status: DC

## 2020-10-16 NOTE — Progress Notes (Signed)
Subjective:    Patient ID: [name], female    DOB: [birth year], 86 y.o.   MRN: [id number]  Hypertension This is a chronic problem. The current episode started more than 1 year ago. Pertinent negatives include no chest pain, headaches or shortness of breath. Risk factors for coronary artery disease include diabetes mellitus. Treatments tried: amlodipine, hctz.   Knee pain tylenol dose recommendations We did discuss that she may take up to 6 Tylenol's per day no more than that Essential hypertension - Plan: Comprehensive metabolic panel, amLODipine (NORVASC) 10 MG tablet, hydrochlorothiazide (HYDRODIURIL) 25 MG tablet, losartan (COZAAR) 50 MG tablet  Hyperlipidemia associated with type 2 diabetes mellitus ([place]) - Plan: Lipid panel  Controlled type 2 diabetes mellitus without complication, without long-term current use of insulin ([place]) - Plan: Hemoglobin A1c, Urine Microalbumin w/creat. ratio, metFORMIN (GLUCOPHAGE) 500 MG tablet  Myalgia due to statin  Generalized anxiety disorder - Plan: hydrALAZINE (APRESOLINE) 25 MG tablet, ALPRAZolam (XANAX) 0.25 MG tablet, DISCONTINUED: ALPRAZolam (XANAX) 0.25 MG tablet  Other insomnia - Plan: ALPRAZolam (XANAX) 0.25 MG tablet, DISCONTINUED: ALPRAZolam (XANAX) 0.25 MG tablet  Gastroesophageal reflux disease without esophagitis - Plan: pantoprazole (PROTONIX) 20 MG tablet  We did discuss dietary measures avoiding low sugars May uses Xanax as needed for the anxiety and insomnia  Review of Systems  Constitutional: Negative for activity change, fatigue and fever.  HENT: Negative for congestion and rhinorrhea.   Respiratory: Negative for cough, chest tightness and shortness of breath.   Cardiovascular: Negative for chest pain and leg swelling.  Gastrointestinal: Negative for abdominal pain and nausea.  Skin: Negative for color change.  Neurological: Negative for dizziness and headaches.  Psychiatric/Behavioral: Negative for agitation and  behavioral problems.       Objective:   Physical Exam Vitals reviewed.  Constitutional:      General: She is not in acute distress. HENT:     Head: Normocephalic.  Cardiovascular:     Rate and Rhythm: Normal rate and regular rhythm.     Heart sounds: Normal heart sounds. No murmur heard.   Pulmonary:     Effort: Pulmonary effort is normal.     Breath sounds: Normal breath sounds.  Lymphadenopathy:     Cervical: No cervical adenopathy.  Neurological:     Mental Status: She is alert.  Psychiatric:        Behavior: Behavior normal.           Assessment & Plan:  1. Essential hypertension Blood pressure good control continue current medication watch diet - Comprehensive metabolic panel - amLODipine (NORVASC) 10 MG tablet; Take 1 tablet (10 mg total) by mouth daily.  Dispense: 30 tablet; Refill: 5 - hydrochlorothiazide (HYDRODIURIL) 25 MG tablet; Take 1 tablet (25 mg total) by mouth daily.  Dispense: 30 tablet; Refill: 5 - losartan (COZAAR) 50 MG tablet; TAKE (1) TABLET BY MOUTH ONCE DAILY.  Dispense: 30 tablet; Refill: 5  2. Hyperlipidemia associated with type 2 diabetes mellitus ([place]) Does not tolerate statins.  Stick with healthy diet.  Check lab work - Lipid panel  3. Controlled type 2 diabetes mellitus without complication, without long-term current use of insulin ([place]) Watches diet takes medication check lab work to see what A1c is doing follow-up in 4 to 6 months - Hemoglobin A1c - Urine Microalbumin w/creat. ratio - metFORMIN (GLUCOPHAGE) 500 MG tablet; Take one tablet 500mg  po in the morning and 1/2 tablet 250 mg po at supper  Dispense: 45 tablet; Refill: 6  4.  Myalgia due to statin Does not tolerate statins has been tried several times without success patient has significant myalgias  5. Generalized anxiety disorder Low-dose Xanax twice daily.  Caution drowsiness.  Have tried to come off of this before and patient has not done well she functions well on a  low-dose we will stick with - hydrALAZINE (APRESOLINE) 25 MG tablet; Take 1 tablet (25 mg total) by mouth 2 (two) times daily.  Dispense: 60 tablet; Refill: 5 - ALPRAZolam (XANAX) 0.25 MG tablet; TAKE 1 TABLET BY MOUTH TWICE A DAY.  Dispense: 60 tablet; Refill: 5  6. Other insomnia She uses the Xanax low-dose at nighttime seems to help she is try to cut down from this and it was very difficult did not function well stick with current meds - ALPRAZolam (XANAX) 0.25 MG tablet; TAKE 1 TABLET BY MOUTH TWICE A DAY.  Dispense: 60 tablet; Refill: 5  7. Gastroesophageal reflux disease without esophagitis Stays on the current medication her - pantoprazole (PROTONIX) 20 MG tablet; TAKE (1) TABLET BY MOUTH ONCE DAILY.  Dispense: 30 tablet; Refill: 5   Follow-up in 4 months

## 2020-10-17 DIAGNOSIS — U071 COVID-19: Secondary | ICD-10-CM | POA: Diagnosis not present

## 2020-10-17 DIAGNOSIS — Z20828 Contact with and (suspected) exposure to other viral communicable diseases: Secondary | ICD-10-CM | POA: Diagnosis not present

## 2020-10-24 DIAGNOSIS — U071 COVID-19: Secondary | ICD-10-CM | POA: Diagnosis not present

## 2020-10-24 DIAGNOSIS — Z20828 Contact with and (suspected) exposure to other viral communicable diseases: Secondary | ICD-10-CM | POA: Diagnosis not present

## 2020-10-26 ENCOUNTER — Telehealth: Payer: Self-pay

## 2020-10-26 DIAGNOSIS — M1711 Unilateral primary osteoarthritis, right knee: Secondary | ICD-10-CM | POA: Diagnosis not present

## 2020-10-26 NOTE — Telephone Encounter (Signed)
[facility] dropped off a form for [facility] for insulin orders.  Put in folder for Dr. Nicki Reaper signature.  [facility]'s number for p/u is on the back of the form.

## 2020-10-27 NOTE — Telephone Encounter (Signed)
This was signed thank you 

## 2020-10-31 DIAGNOSIS — Z20828 Contact with and (suspected) exposure to other viral communicable diseases: Secondary | ICD-10-CM | POA: Diagnosis not present

## 2020-10-31 DIAGNOSIS — U071 COVID-19: Secondary | ICD-10-CM | POA: Diagnosis not present

## 2020-11-01 ENCOUNTER — Other Ambulatory Visit: Payer: Self-pay | Admitting: Family Medicine

## 2020-11-02 DIAGNOSIS — M1711 Unilateral primary osteoarthritis, right knee: Secondary | ICD-10-CM | POA: Diagnosis not present

## 2020-11-07 DIAGNOSIS — Z20828 Contact with and (suspected) exposure to other viral communicable diseases: Secondary | ICD-10-CM | POA: Diagnosis not present

## 2020-11-07 DIAGNOSIS — U071 COVID-19: Secondary | ICD-10-CM | POA: Diagnosis not present

## 2020-11-09 ENCOUNTER — Other Ambulatory Visit: Payer: Self-pay | Admitting: Family Medicine

## 2020-11-09 MED ORDER — HYDROCODONE BIT-HOMATROP MBR 5-1.5 MG/5ML PO SOLN: ORAL | 0 refills | Status: DC

## 2020-11-09 MED ORDER — BENZONATATE 100 MG PO CAPS: ORAL_CAPSULE | ORAL | 2 refills | Status: DC

## 2020-11-09 NOTE — Addendum Note (Signed)
Addended by: Vicente Males on: 11/09/2020 01:45 PM   Modules accepted: Orders

## 2020-11-09 NOTE — Telephone Encounter (Signed)
Spoke with [name] at [facility] and she verbalized understanding.  Tessalon sent in and Hycodan pended. Please advise. Thank you

## 2020-11-09 NOTE — Telephone Encounter (Signed)
[facility] calling in and requesting something for patient cough. Pt also sounds congested. Pt is tested for COVID weekly and is negative. [facility] states that there are multiple patients with cough and congestion. Pt has order for Tussin Dm 100-10 mg/5 ml liquid-take one teaspoon po q 4 hrs during the day prn cough but patient says that provider was going to discontinue the Tussin. Please advise. Thank you  [facility] [phone]

## 2020-11-09 NOTE — Telephone Encounter (Signed)
So there are no good options regarding what to do for cough Cough is there for reason Often due to infection or congestion May use Tessalon Perles 100 mg 1 taken 3 times daily as needed for cough during the day, #21 with 2 refills May use Hycodan 1 teaspoon nightly as needed cough caution drowsiness 60 mL please pend me and we can send this to the pharmacy  Certainly follow-up if any ongoing or worsening issues

## 2020-11-10 ENCOUNTER — Other Ambulatory Visit: Payer: Self-pay | Admitting: Family Medicine

## 2020-11-10 DIAGNOSIS — M1711 Unilateral primary osteoarthritis, right knee: Secondary | ICD-10-CM | POA: Diagnosis not present

## 2020-11-10 NOTE — Telephone Encounter (Signed)
As best I can tell I signed off on this

## 2020-11-14 DIAGNOSIS — U071 COVID-19: Secondary | ICD-10-CM | POA: Diagnosis not present

## 2020-11-14 DIAGNOSIS — Z20828 Contact with and (suspected) exposure to other viral communicable diseases: Secondary | ICD-10-CM | POA: Diagnosis not present

## 2020-11-16 DIAGNOSIS — H353112 Nonexudative age-related macular degeneration, right eye, intermediate dry stage: Secondary | ICD-10-CM | POA: Diagnosis not present

## 2020-11-16 LAB — HM DIABETES EYE EXAM

## 2020-11-21 DIAGNOSIS — U071 COVID-19: Secondary | ICD-10-CM | POA: Diagnosis not present

## 2020-11-21 DIAGNOSIS — Z20828 Contact with and (suspected) exposure to other viral communicable diseases: Secondary | ICD-10-CM | POA: Diagnosis not present

## 2020-11-22 ENCOUNTER — Encounter: Payer: Self-pay | Admitting: *Deleted

## 2020-11-23 ENCOUNTER — Other Ambulatory Visit: Payer: Self-pay | Admitting: Family Medicine

## 2020-11-28 NOTE — Telephone Encounter (Signed)
At this point it would be better for the patient to be on Tessalon 100 mg 1 taken 3 times daily as needed for cough #15

## 2020-11-29 DIAGNOSIS — Z20828 Contact with and (suspected) exposure to other viral communicable diseases: Secondary | ICD-10-CM | POA: Diagnosis not present

## 2020-11-29 DIAGNOSIS — U071 COVID-19: Secondary | ICD-10-CM | POA: Diagnosis not present

## 2020-11-29 NOTE — Telephone Encounter (Signed)
Patient stated she believes it was the pharmacy that requested the syrup because it was out of refills, but she doesn't need anything for cough, she is doing better.

## 2020-11-29 NOTE — Telephone Encounter (Signed)
Tried calling patient with no answer , no voicemail available.

## 2020-11-30 DIAGNOSIS — E785 Hyperlipidemia, unspecified: Secondary | ICD-10-CM | POA: Diagnosis not present

## 2020-11-30 DIAGNOSIS — E119 Type 2 diabetes mellitus without complications: Secondary | ICD-10-CM | POA: Diagnosis not present

## 2020-11-30 DIAGNOSIS — I10 Essential (primary) hypertension: Secondary | ICD-10-CM | POA: Diagnosis not present

## 2020-11-30 DIAGNOSIS — E1169 Type 2 diabetes mellitus with other specified complication: Secondary | ICD-10-CM | POA: Diagnosis not present

## 2020-12-01 LAB — MICROALBUMIN / CREATININE URINE RATIO
Creatinine, Urine: 31.9 mg/dL
Microalb/Creat Ratio: 114 mg/g creat — ABNORMAL HIGH (ref 0–29)
Microalbumin, Urine: 36.3 ug/mL

## 2020-12-01 LAB — COMPREHENSIVE METABOLIC PANEL
ALT: 14 IU/L (ref 0–32)
AST: 19 IU/L (ref 0–40)
Albumin/Globulin Ratio: 2.2 (ref 1.2–2.2)
Albumin: 4.7 g/dL — ABNORMAL HIGH (ref 3.6–4.6)
Alkaline Phosphatase: 106 IU/L (ref 44–121)
BUN/Creatinine Ratio: 19 (ref 12–28)
BUN: 16 mg/dL (ref 8–27)
Bilirubin Total: 1 mg/dL (ref 0.0–1.2)
CO2: 26 mmol/L (ref 20–29)
Calcium: 9.9 mg/dL (ref 8.7–10.3)
Chloride: 93 mmol/L — ABNORMAL LOW (ref 96–106)
Creatinine, Ser: 0.84 mg/dL (ref 0.57–1.00)
Globulin, Total: 2.1 g/dL (ref 1.5–4.5)
Glucose: 144 mg/dL — ABNORMAL HIGH (ref 65–99)
Potassium: 4.2 mmol/L (ref 3.5–5.2)
Sodium: 136 mmol/L (ref 134–144)
Total Protein: 6.8 g/dL (ref 6.0–8.5)
eGFR: 67 mL/min/{1.73_m2} (ref >59)

## 2020-12-01 LAB — LIPID PANEL
Chol/HDL Ratio: 4.1 ratio (ref 0.0–4.4)
Cholesterol, Total: 204 mg/dL — ABNORMAL HIGH (ref 100–199)
HDL: 50 mg/dL (ref >39)
LDL Chol Calc (NIH): 121 mg/dL — ABNORMAL HIGH (ref 0–99)
Triglycerides: 187 mg/dL — ABNORMAL HIGH (ref 0–149)
VLDL Cholesterol Cal: 33 mg/dL (ref 5–40)

## 2020-12-01 LAB — HEMOGLOBIN A1C
Est. average glucose Bld gHb Est-mCnc: 154 mg/dL
Hgb A1c MFr Bld: 7 % — ABNORMAL HIGH (ref 4.8–5.6)

## 2020-12-02 ENCOUNTER — Encounter: Payer: Self-pay | Admitting: Family Medicine

## 2020-12-04 ENCOUNTER — Other Ambulatory Visit: Payer: Self-pay | Admitting: Family Medicine

## 2020-12-05 DIAGNOSIS — U071 COVID-19: Secondary | ICD-10-CM | POA: Diagnosis not present

## 2020-12-05 DIAGNOSIS — Z20828 Contact with and (suspected) exposure to other viral communicable diseases: Secondary | ICD-10-CM | POA: Diagnosis not present

## 2020-12-12 DIAGNOSIS — U071 COVID-19: Secondary | ICD-10-CM | POA: Diagnosis not present

## 2020-12-12 DIAGNOSIS — Z20828 Contact with and (suspected) exposure to other viral communicable diseases: Secondary | ICD-10-CM | POA: Diagnosis not present

## 2020-12-19 DIAGNOSIS — L84 Corns and callosities: Secondary | ICD-10-CM | POA: Diagnosis not present

## 2020-12-19 DIAGNOSIS — U071 COVID-19: Secondary | ICD-10-CM | POA: Diagnosis not present

## 2020-12-19 DIAGNOSIS — B351 Tinea unguium: Secondary | ICD-10-CM | POA: Diagnosis not present

## 2020-12-19 DIAGNOSIS — E1142 Type 2 diabetes mellitus with diabetic polyneuropathy: Secondary | ICD-10-CM | POA: Diagnosis not present

## 2020-12-19 DIAGNOSIS — Z20828 Contact with and (suspected) exposure to other viral communicable diseases: Secondary | ICD-10-CM | POA: Diagnosis not present

## 2020-12-26 DIAGNOSIS — Z20828 Contact with and (suspected) exposure to other viral communicable diseases: Secondary | ICD-10-CM | POA: Diagnosis not present

## 2020-12-26 DIAGNOSIS — U071 COVID-19: Secondary | ICD-10-CM | POA: Diagnosis not present

## 2021-01-02 DIAGNOSIS — U071 COVID-19: Secondary | ICD-10-CM | POA: Diagnosis not present

## 2021-01-02 DIAGNOSIS — Z20828 Contact with and (suspected) exposure to other viral communicable diseases: Secondary | ICD-10-CM | POA: Diagnosis not present

## 2021-01-04 ENCOUNTER — Other Ambulatory Visit: Payer: Self-pay | Admitting: Family Medicine

## 2021-01-09 ENCOUNTER — Telehealth: Payer: Medicare Other | Admitting: Family Medicine

## 2021-01-09 DIAGNOSIS — Z20828 Contact with and (suspected) exposure to other viral communicable diseases: Secondary | ICD-10-CM | POA: Diagnosis not present

## 2021-01-09 DIAGNOSIS — U071 COVID-19: Secondary | ICD-10-CM | POA: Diagnosis not present

## 2021-01-09 NOTE — Telephone Encounter (Signed)
Sunday Spillers from [facility] calling and states that patient has been having lower left back pain. Pt states last week she felt like she may have pulled something. Pain has eased up but not completely gone. Pt wanting to know if provider could see her or recommendations on what to do.  (Schedule is pretty much booked up so just wanted to check with provider). Please advise. Thank you

## 2021-01-10 NOTE — Telephone Encounter (Signed)
[name] at [facility] notified and will bring patient to appointment 01/10/21 at 11:30 am

## 2021-01-10 NOTE — Telephone Encounter (Signed)
May use Tylenol for discomfort, warm compresses  We can do a visit 11:30 AM on Thursday 7/14

## 2021-01-11 ENCOUNTER — Other Ambulatory Visit: Payer: Self-pay

## 2021-01-11 ENCOUNTER — Ambulatory Visit (INDEPENDENT_AMBULATORY_CARE_PROVIDER_SITE_OTHER): Payer: Medicare Other | Admitting: Family Medicine

## 2021-01-11 ENCOUNTER — Encounter: Payer: Self-pay | Admitting: Family Medicine

## 2021-01-11 VITALS — BP 138/77 | HR 87 | Temp 97.7°F | Ht 68.0 in | Wt 168.0 lb

## 2021-01-11 DIAGNOSIS — S39012A Strain of muscle, fascia and tendon of lower back, initial encounter: Secondary | ICD-10-CM

## 2021-01-11 DIAGNOSIS — M545 Low back pain, unspecified: Secondary | ICD-10-CM

## 2021-01-11 NOTE — Progress Notes (Addendum)
   Subjective:    Patient ID: [name], female    DOB: 12-30-1931, 87 y.o.   MRN: [id number]  HPI Pt arrives with caregiver from [facility].  Pt having low back pain on left side. Started about one and a half weeks ago after twisting too far when helping fold silverware in the napkins. Would like to see if she can get PT.   Patient relates that she has a lot of pain discomfort with twisting bending forward she denies radiation down the legs she states the pain was bad enough to wake her up in the middle of the night single Tylenol is not doing enough to help it She wonders if a muscle relaxer would help Never had this problem before Review of Systems     Objective:   Physical Exam Lungs are clear respiratory rate normal heart is regular low back moderate tenderness to palpation negative straight leg raise able to get out of the chair       Assessment & Plan:   Patient is homebound at the rest home does not drive Would benefit from physical therapy Recommend physical therapy Tylenol 500 mg take 2 tablets every 6 hours as needed for pain Stretching exercises recommended Follow-up if ongoing troubles If not dramatically better within 6 weeks consider further evaluation X-rays ordered because of nighttime pain and advanced age  Back strain we will go ahead with referral for physical therapy

## 2021-01-12 ENCOUNTER — Ambulatory Visit (HOSPITAL_COMMUNITY)
Admission: RE | Admit: 2021-01-12 | Discharge: 2021-01-12 | Disposition: A | Discharge status: Home / Self Care | Payer: Medicare Other | Source: Ambulatory Visit | Attending: Family Medicine | Admitting: Family Medicine

## 2021-01-12 DIAGNOSIS — M545 Low back pain, unspecified: Secondary | ICD-10-CM | POA: Insufficient documentation

## 2021-01-12 DIAGNOSIS — R2689 Other abnormalities of gait and mobility: Secondary | ICD-10-CM | POA: Diagnosis not present

## 2021-01-12 DIAGNOSIS — E1169 Type 2 diabetes mellitus with other specified complication: Secondary | ICD-10-CM | POA: Diagnosis not present

## 2021-01-12 DIAGNOSIS — I10 Essential (primary) hypertension: Secondary | ICD-10-CM | POA: Diagnosis not present

## 2021-01-12 DIAGNOSIS — I69318 Other symptoms and signs involving cognitive functions following cerebral infarction: Secondary | ICD-10-CM | POA: Diagnosis not present

## 2021-01-12 DIAGNOSIS — Z7984 Long term (current) use of oral hypoglycemic drugs: Secondary | ICD-10-CM | POA: Diagnosis not present

## 2021-01-12 DIAGNOSIS — F0150 Vascular dementia without behavioral disturbance: Secondary | ICD-10-CM | POA: Diagnosis not present

## 2021-01-12 DIAGNOSIS — M5459 Other low back pain: Secondary | ICD-10-CM | POA: Diagnosis not present

## 2021-01-12 DIAGNOSIS — S335XXD Sprain of ligaments of lumbar spine, subsequent encounter: Secondary | ICD-10-CM | POA: Diagnosis not present

## 2021-01-15 DIAGNOSIS — S335XXD Sprain of ligaments of lumbar spine, subsequent encounter: Secondary | ICD-10-CM | POA: Diagnosis not present

## 2021-01-15 DIAGNOSIS — F0150 Vascular dementia without behavioral disturbance: Secondary | ICD-10-CM | POA: Diagnosis not present

## 2021-01-15 DIAGNOSIS — I69318 Other symptoms and signs involving cognitive functions following cerebral infarction: Secondary | ICD-10-CM | POA: Diagnosis not present

## 2021-01-15 DIAGNOSIS — E1169 Type 2 diabetes mellitus with other specified complication: Secondary | ICD-10-CM | POA: Diagnosis not present

## 2021-01-15 DIAGNOSIS — M5459 Other low back pain: Secondary | ICD-10-CM | POA: Diagnosis not present

## 2021-01-15 DIAGNOSIS — I10 Essential (primary) hypertension: Secondary | ICD-10-CM | POA: Diagnosis not present

## 2021-01-16 DIAGNOSIS — I10 Essential (primary) hypertension: Secondary | ICD-10-CM | POA: Diagnosis not present

## 2021-01-16 DIAGNOSIS — F0150 Vascular dementia without behavioral disturbance: Secondary | ICD-10-CM | POA: Diagnosis not present

## 2021-01-16 DIAGNOSIS — U071 COVID-19: Secondary | ICD-10-CM | POA: Diagnosis not present

## 2021-01-16 DIAGNOSIS — S335XXD Sprain of ligaments of lumbar spine, subsequent encounter: Secondary | ICD-10-CM | POA: Diagnosis not present

## 2021-01-16 DIAGNOSIS — E1169 Type 2 diabetes mellitus with other specified complication: Secondary | ICD-10-CM | POA: Diagnosis not present

## 2021-01-16 DIAGNOSIS — I69318 Other symptoms and signs involving cognitive functions following cerebral infarction: Secondary | ICD-10-CM | POA: Diagnosis not present

## 2021-01-16 DIAGNOSIS — M5459 Other low back pain: Secondary | ICD-10-CM | POA: Diagnosis not present

## 2021-01-16 DIAGNOSIS — Z20828 Contact with and (suspected) exposure to other viral communicable diseases: Secondary | ICD-10-CM | POA: Diagnosis not present

## 2021-01-19 ENCOUNTER — Telehealth: Payer: Self-pay | Admitting: *Deleted

## 2021-01-19 MED ORDER — TRAMADOL HCL 50 MG PO TABS: 50.0000 mg | ORAL_TABLET | Freq: Three times a day (TID) | ORAL | 0 refills | Status: AC | PRN

## 2021-01-19 NOTE — Telephone Encounter (Signed)
[facility] called and stated the patient is still having a lot of problems with her back pain. She is using ice and heat and tylenol as advised with no help- they are aware Dr Nicki Reaper out of office till Monday

## 2021-01-19 NOTE — Telephone Encounter (Signed)
[name] at [facility] notified of new med and advised the medication had been sent in to [facility] and follow up if ongoing symptoms.

## 2021-01-19 NOTE — Telephone Encounter (Signed)
Sent in small amt of tramadol to try for her pain.  If worsening or not improving to call back.   Thanks,   Dr. Lovena Le

## 2021-01-22 DIAGNOSIS — F0150 Vascular dementia without behavioral disturbance: Secondary | ICD-10-CM | POA: Diagnosis not present

## 2021-01-22 DIAGNOSIS — M5459 Other low back pain: Secondary | ICD-10-CM | POA: Diagnosis not present

## 2021-01-22 DIAGNOSIS — E1169 Type 2 diabetes mellitus with other specified complication: Secondary | ICD-10-CM | POA: Diagnosis not present

## 2021-01-22 DIAGNOSIS — S335XXD Sprain of ligaments of lumbar spine, subsequent encounter: Secondary | ICD-10-CM | POA: Diagnosis not present

## 2021-01-22 DIAGNOSIS — I69318 Other symptoms and signs involving cognitive functions following cerebral infarction: Secondary | ICD-10-CM | POA: Diagnosis not present

## 2021-01-22 DIAGNOSIS — I10 Essential (primary) hypertension: Secondary | ICD-10-CM | POA: Diagnosis not present

## 2021-01-23 DIAGNOSIS — Z20828 Contact with and (suspected) exposure to other viral communicable diseases: Secondary | ICD-10-CM | POA: Diagnosis not present

## 2021-01-23 DIAGNOSIS — U071 COVID-19: Secondary | ICD-10-CM | POA: Diagnosis not present

## 2021-01-24 DIAGNOSIS — I69318 Other symptoms and signs involving cognitive functions following cerebral infarction: Secondary | ICD-10-CM | POA: Diagnosis not present

## 2021-01-24 DIAGNOSIS — F0150 Vascular dementia without behavioral disturbance: Secondary | ICD-10-CM | POA: Diagnosis not present

## 2021-01-24 DIAGNOSIS — M5459 Other low back pain: Secondary | ICD-10-CM | POA: Diagnosis not present

## 2021-01-24 DIAGNOSIS — S335XXD Sprain of ligaments of lumbar spine, subsequent encounter: Secondary | ICD-10-CM | POA: Diagnosis not present

## 2021-01-24 DIAGNOSIS — E1169 Type 2 diabetes mellitus with other specified complication: Secondary | ICD-10-CM | POA: Diagnosis not present

## 2021-01-24 DIAGNOSIS — I10 Essential (primary) hypertension: Secondary | ICD-10-CM | POA: Diagnosis not present

## 2021-01-25 ENCOUNTER — Other Ambulatory Visit: Payer: Self-pay | Admitting: Family Medicine

## 2021-01-29 DIAGNOSIS — I10 Essential (primary) hypertension: Secondary | ICD-10-CM | POA: Diagnosis not present

## 2021-01-29 DIAGNOSIS — S335XXD Sprain of ligaments of lumbar spine, subsequent encounter: Secondary | ICD-10-CM | POA: Diagnosis not present

## 2021-01-29 DIAGNOSIS — F0150 Vascular dementia without behavioral disturbance: Secondary | ICD-10-CM | POA: Diagnosis not present

## 2021-01-29 DIAGNOSIS — M5459 Other low back pain: Secondary | ICD-10-CM | POA: Diagnosis not present

## 2021-01-29 DIAGNOSIS — I69318 Other symptoms and signs involving cognitive functions following cerebral infarction: Secondary | ICD-10-CM | POA: Diagnosis not present

## 2021-01-29 DIAGNOSIS — E1169 Type 2 diabetes mellitus with other specified complication: Secondary | ICD-10-CM | POA: Diagnosis not present

## 2021-01-30 DIAGNOSIS — U071 COVID-19: Secondary | ICD-10-CM | POA: Diagnosis not present

## 2021-01-30 DIAGNOSIS — Z20828 Contact with and (suspected) exposure to other viral communicable diseases: Secondary | ICD-10-CM | POA: Diagnosis not present

## 2021-01-31 DIAGNOSIS — I10 Essential (primary) hypertension: Secondary | ICD-10-CM | POA: Diagnosis not present

## 2021-01-31 DIAGNOSIS — M5459 Other low back pain: Secondary | ICD-10-CM | POA: Diagnosis not present

## 2021-01-31 DIAGNOSIS — I69318 Other symptoms and signs involving cognitive functions following cerebral infarction: Secondary | ICD-10-CM | POA: Diagnosis not present

## 2021-01-31 DIAGNOSIS — S335XXD Sprain of ligaments of lumbar spine, subsequent encounter: Secondary | ICD-10-CM | POA: Diagnosis not present

## 2021-01-31 DIAGNOSIS — F0150 Vascular dementia without behavioral disturbance: Secondary | ICD-10-CM | POA: Diagnosis not present

## 2021-01-31 DIAGNOSIS — E1169 Type 2 diabetes mellitus with other specified complication: Secondary | ICD-10-CM | POA: Diagnosis not present

## 2021-02-05 DIAGNOSIS — I10 Essential (primary) hypertension: Secondary | ICD-10-CM | POA: Diagnosis not present

## 2021-02-05 DIAGNOSIS — M5459 Other low back pain: Secondary | ICD-10-CM | POA: Diagnosis not present

## 2021-02-05 DIAGNOSIS — S335XXD Sprain of ligaments of lumbar spine, subsequent encounter: Secondary | ICD-10-CM | POA: Diagnosis not present

## 2021-02-05 DIAGNOSIS — F0150 Vascular dementia without behavioral disturbance: Secondary | ICD-10-CM | POA: Diagnosis not present

## 2021-02-05 DIAGNOSIS — I69318 Other symptoms and signs involving cognitive functions following cerebral infarction: Secondary | ICD-10-CM | POA: Diagnosis not present

## 2021-02-05 DIAGNOSIS — E1169 Type 2 diabetes mellitus with other specified complication: Secondary | ICD-10-CM | POA: Diagnosis not present

## 2021-02-06 DIAGNOSIS — Z20828 Contact with and (suspected) exposure to other viral communicable diseases: Secondary | ICD-10-CM | POA: Diagnosis not present

## 2021-02-06 DIAGNOSIS — U071 COVID-19: Secondary | ICD-10-CM | POA: Diagnosis not present

## 2021-02-07 DIAGNOSIS — M5459 Other low back pain: Secondary | ICD-10-CM | POA: Diagnosis not present

## 2021-02-07 DIAGNOSIS — I69318 Other symptoms and signs involving cognitive functions following cerebral infarction: Secondary | ICD-10-CM | POA: Diagnosis not present

## 2021-02-07 DIAGNOSIS — S335XXD Sprain of ligaments of lumbar spine, subsequent encounter: Secondary | ICD-10-CM | POA: Diagnosis not present

## 2021-02-07 DIAGNOSIS — F0150 Vascular dementia without behavioral disturbance: Secondary | ICD-10-CM | POA: Diagnosis not present

## 2021-02-07 DIAGNOSIS — I10 Essential (primary) hypertension: Secondary | ICD-10-CM | POA: Diagnosis not present

## 2021-02-07 DIAGNOSIS — E1169 Type 2 diabetes mellitus with other specified complication: Secondary | ICD-10-CM | POA: Diagnosis not present

## 2021-02-09 ENCOUNTER — Telehealth: Payer: Self-pay

## 2021-02-09 NOTE — Telephone Encounter (Signed)
Call received from [facility] or [facility] stating patient will need to be discharged from services due to insufficient diagnoses for medicare requirement. Dx lumbar sprain can be addended to most recent face to face notes. Please advise [phone] Fx - [phone] [facility]

## 2021-02-11 DIAGNOSIS — I10 Essential (primary) hypertension: Secondary | ICD-10-CM | POA: Diagnosis not present

## 2021-02-11 DIAGNOSIS — I69318 Other symptoms and signs involving cognitive functions following cerebral infarction: Secondary | ICD-10-CM | POA: Diagnosis not present

## 2021-02-11 DIAGNOSIS — F0150 Vascular dementia without behavioral disturbance: Secondary | ICD-10-CM | POA: Diagnosis not present

## 2021-02-11 DIAGNOSIS — M5459 Other low back pain: Secondary | ICD-10-CM | POA: Diagnosis not present

## 2021-02-11 DIAGNOSIS — Z7984 Long term (current) use of oral hypoglycemic drugs: Secondary | ICD-10-CM | POA: Diagnosis not present

## 2021-02-11 DIAGNOSIS — E1169 Type 2 diabetes mellitus with other specified complication: Secondary | ICD-10-CM | POA: Diagnosis not present

## 2021-02-11 DIAGNOSIS — R2689 Other abnormalities of gait and mobility: Secondary | ICD-10-CM | POA: Diagnosis not present

## 2021-02-11 DIAGNOSIS — S335XXD Sprain of ligaments of lumbar spine, subsequent encounter: Secondary | ICD-10-CM | POA: Diagnosis not present

## 2021-02-11 NOTE — Telephone Encounter (Signed)
Send 01/11/2021 note

## 2021-02-12 NOTE — Telephone Encounter (Signed)
01/11/21 note faxed to [facility].

## 2021-02-13 ENCOUNTER — Ambulatory Visit: Payer: Medicare Other | Admitting: Family Medicine

## 2021-02-13 DIAGNOSIS — U071 COVID-19: Secondary | ICD-10-CM | POA: Diagnosis not present

## 2021-02-13 DIAGNOSIS — Z20828 Contact with and (suspected) exposure to other viral communicable diseases: Secondary | ICD-10-CM | POA: Diagnosis not present

## 2021-02-14 ENCOUNTER — Telehealth: Payer: Self-pay | Admitting: Family Medicine

## 2021-02-14 NOTE — Telephone Encounter (Signed)
Home Health calling in regards to face to face visit notes they received. Dx is Lumbar Pain and need to be Lumbar Sprain. Home Health states Lumbar Pain is a symptom and cant be used as diagnosis. Lumbar Sprain is billable with pt insurance. Please advise. Thank you.  Fax number [phone]

## 2021-02-19 DIAGNOSIS — I10 Essential (primary) hypertension: Secondary | ICD-10-CM | POA: Diagnosis not present

## 2021-02-19 DIAGNOSIS — M5459 Other low back pain: Secondary | ICD-10-CM | POA: Diagnosis not present

## 2021-02-19 DIAGNOSIS — S335XXD Sprain of ligaments of lumbar spine, subsequent encounter: Secondary | ICD-10-CM | POA: Diagnosis not present

## 2021-02-19 DIAGNOSIS — F0150 Vascular dementia without behavioral disturbance: Secondary | ICD-10-CM | POA: Diagnosis not present

## 2021-02-19 DIAGNOSIS — I69318 Other symptoms and signs involving cognitive functions following cerebral infarction: Secondary | ICD-10-CM | POA: Diagnosis not present

## 2021-02-19 DIAGNOSIS — E1169 Type 2 diabetes mellitus with other specified complication: Secondary | ICD-10-CM | POA: Diagnosis not present

## 2021-02-20 ENCOUNTER — Other Ambulatory Visit: Payer: Self-pay

## 2021-02-20 ENCOUNTER — Ambulatory Visit (INDEPENDENT_AMBULATORY_CARE_PROVIDER_SITE_OTHER): Payer: Medicare Other | Admitting: Family Medicine

## 2021-02-20 VITALS — BP 130/50

## 2021-02-20 DIAGNOSIS — S39012D Strain of muscle, fascia and tendon of lower back, subsequent encounter: Secondary | ICD-10-CM

## 2021-02-20 DIAGNOSIS — E1169 Type 2 diabetes mellitus with other specified complication: Secondary | ICD-10-CM | POA: Diagnosis not present

## 2021-02-20 DIAGNOSIS — E119 Type 2 diabetes mellitus without complications: Secondary | ICD-10-CM

## 2021-02-20 DIAGNOSIS — E785 Hyperlipidemia, unspecified: Secondary | ICD-10-CM | POA: Diagnosis not present

## 2021-02-20 DIAGNOSIS — I10 Essential (primary) hypertension: Secondary | ICD-10-CM | POA: Diagnosis not present

## 2021-02-20 NOTE — Telephone Encounter (Signed)
I did do an addendum for July 14 note, may send that thank you

## 2021-02-20 NOTE — Patient Instructions (Signed)
Please do your labs at the end of September before your follow-up visit in October We will see you then [facility]-Dr. Nicki Reaper

## 2021-02-20 NOTE — Telephone Encounter (Signed)
Note from 01/11/21 faxed to home health

## 2021-02-20 NOTE — Progress Notes (Signed)
   Subjective:    Patient ID: [name], female    DOB: [birth year], 87 y.o.   MRN: [id number]  HPI  Patient arrives for a follow up. Patient states she tested positive for Covid last week but has not had any symptoms.  Denies any chest pain shortness of breath  Patient for blood pressure check up.  The patient does have hypertension.    Medication compliance-good compliance  Blood pressure control recently-states blood pressure [facility] at [facility] home good  Dietary compliance-watching diet fitting and walking on a regular basis  The patient was seen today as part of a comprehensive diabetic check up. Patient has diabetes  Compliance-takes her medication Low sugars-denies any low sugars Dietary effort-putting forth a good dietary effort Foot exam and ophthalmology exam requirements were reviewed  Patient had back strain was doing physical therapy its been very helpful for her Review of Systems     Objective:   Physical Exam General-in no acute distress Eyes-no discharge Lungs-respiratory rate normal, CTA CV-no murmurs,RRR Extremities skin warm dry no edema Neuro grossly normal Behavior normal, alert        Assessment & Plan:  HTN- patient seen for follow-up regarding HTN.  Diet, medication compliance, appropriate labs and refills were completed.  Importance of keeping blood pressure under good control to lessen the risk of complications discussed Continue current medication blood pressure good control  The patient was seen today as part of a comprehensive visit for diabetes. The importance of keeping her A1c at or below 7 range was discussed.  Discussed diet, activity, and medication compliance Standard follow-up visit recommended.  Patient aware lack of control and follow-up increases risk of diabetic complications. Will check A1c again at the end of September with a follow-up office visit  COVID-she is to continue wearing mask for a total of 10 days no sign of  infection going on no need for any type of medication It is possible it may have been a false positive Call us if any problems  Lab work before follow-up in 6 weeks  Patient is back troubles doing much better and has 1 more physical therapy then is completed

## 2021-02-21 ENCOUNTER — Telehealth: Payer: Self-pay | Admitting: *Deleted

## 2021-02-21 DIAGNOSIS — I10 Essential (primary) hypertension: Secondary | ICD-10-CM | POA: Diagnosis not present

## 2021-02-21 DIAGNOSIS — M5459 Other low back pain: Secondary | ICD-10-CM | POA: Diagnosis not present

## 2021-02-21 DIAGNOSIS — I69318 Other symptoms and signs involving cognitive functions following cerebral infarction: Secondary | ICD-10-CM | POA: Diagnosis not present

## 2021-02-21 DIAGNOSIS — E1169 Type 2 diabetes mellitus with other specified complication: Secondary | ICD-10-CM | POA: Diagnosis not present

## 2021-02-21 DIAGNOSIS — F0150 Vascular dementia without behavioral disturbance: Secondary | ICD-10-CM | POA: Diagnosis not present

## 2021-02-21 DIAGNOSIS — S335XXD Sprain of ligaments of lumbar spine, subsequent encounter: Secondary | ICD-10-CM | POA: Diagnosis not present

## 2021-02-21 NOTE — Chronic Care Management (AMB) (Signed)
  Chronic Care Management   Note  02/21/2021 Name: [name] MRN: [id number] DOB: [birth year]  [name] is a 87 y.o. year old female who is a primary care patient of Luking, Elayne Snare, MD. I reached out to [name] by phone today in response to a referral sent by [name] PCP, Kathyrn Drown, MD.      [name] was given information about Chronic Care Management services today including:  CCM service includes personalized support from designated clinical staff supervised by her physician, including individualized plan of care and coordination with other care providers 24/7 contact phone numbers for assistance for urgent and routine care needs. Service will only be billed when office clinical staff spend 20 minutes or more in a month to coordinate care. Only one practitioner may furnish and bill the service in a calendar month. The patient may stop CCM services at any time (effective at the end of the month) by phone call to the office staff. The patient will be responsible for cost sharing (co-pay) of up to 20% of the service fee (after annual deductible is met).  Patient agreed to services and verbal consent obtained.   Follow up plan: Telephone appointment with care management team member scheduled for: 02/23/2021  [facility]  Direct Dial: [phone]

## 2021-02-23 ENCOUNTER — Ambulatory Visit (INDEPENDENT_AMBULATORY_CARE_PROVIDER_SITE_OTHER): Payer: Medicare Other | Admitting: Pharmacist

## 2021-02-23 DIAGNOSIS — E1169 Type 2 diabetes mellitus with other specified complication: Secondary | ICD-10-CM | POA: Diagnosis not present

## 2021-02-23 DIAGNOSIS — E785 Hyperlipidemia, unspecified: Secondary | ICD-10-CM | POA: Diagnosis not present

## 2021-02-23 DIAGNOSIS — E119 Type 2 diabetes mellitus without complications: Secondary | ICD-10-CM | POA: Diagnosis not present

## 2021-02-23 DIAGNOSIS — I10 Essential (primary) hypertension: Secondary | ICD-10-CM | POA: Diagnosis not present

## 2021-02-23 NOTE — Chronic Care Management (AMB) (Signed)
Chronic Care Management Pharmacy Note  02/23/2021 Name:  [name] MRN:  [id number] DOB:  05-Sep-1931  Recommendations made from today's visit:  Hyperlipidemia Will discuss with PCP. Consider adding ezetimibe 10 mg by mouth once daily since patient is not able to tolerate statins and LDL above goal. Patient was provided counseling regarding benefits and potential adverse events and is willing to try  Summary: Spoke with Sunday Spillers from [facility] to complete medication reconciliation since they administer all of her medications.  Patient asked if she could take metformin at 8 PM with all other evening medications instead of with dinner around 5 PM. Discussed that this is clinically ok but may increase risk for GI intolerance. Patient will continue to take with dinner as prescribed for now.  Subjective: [name] is an 87 y.o. year old female who is a primary patient of Luking, Elayne Snare, MD.  The CCM team was consulted for assistance with disease management and care coordination needs.    Engaged with patient by telephone for initial visit in response to provider referral for pharmacy case management and/or care coordination services.   Consent to Services:  The patient was given the following information about Chronic Care Management services today, agreed to services, and gave verbal consent: 1. CCM service includes personalized support from designated clinical staff supervised by the primary care provider, including individualized plan of care and coordination with other care providers 2. 24/7 contact phone numbers for assistance for urgent and routine care needs. 3. Service will only be billed when office clinical staff spend 20 minutes or more in a month to coordinate care. 4. Only one practitioner may furnish and bill the service in a calendar month. 5.The patient may stop CCM services at any time (effective at the end of the month) by phone call to the office staff.  6. The patient will be responsible for cost sharing (co-pay) of up to 20% of the service fee (after annual deductible is met). Patient agreed to services and consent obtained.  Patient Care Team: Kathyrn Drown, MD as PCP - General (Family Medicine) Josue Hector, MD as PCP - Cardiology (Cardiology) Beryle Lathe, [facility] (Pharmacist)  Objective:  Lab Results  Component Value Date   CREATININE 0.84 11/30/2020   CREATININE 0.88 06/06/2020   CREATININE 0.82 04/06/2020    Lab Results  Component Value Date   HGBA1C 7.0 (H) 11/30/2020   Last diabetic Eye exam:  Lab Results  Component Value Date/Time   HMDIABEYEEXA No Retinopathy 11/16/2020 12:00 AM    Last diabetic Foot exam: No results found for: HMDIABFOOTEX      Component Value Date/Time   CHOL 204 (H) 11/30/2020 0831   TRIG 187 (H) 11/30/2020 0831   HDL 50 11/30/2020 0831   CHOLHDL 4.1 11/30/2020 0831   CHOLHDL 3.3 07/05/2014 0907   VLDL 22 07/05/2014 0907   LDLCALC 121 (H) 11/30/2020 0831    Hepatic Function Latest Ref Rng & Units 11/30/2020 04/04/2020 04/03/2020  Total Protein 6.0 - 8.5 g/dL 6.8 6.2(L) 6.7  Albumin 3.6 - 4.6 g/dL 4.7(H) 3.7 3.6  AST 0 - 40 IU/L 19 21 23   ALT 0 - 32 IU/L 14 16 15   Alk Phosphatase 44 - 121 IU/L 106 70 76  Total Bilirubin 0.0 - 1.2 mg/dL 1.0 1.1 1.1  Bilirubin, Direct 0.0 - 0.2 mg/dL - - -    Lab Results  Component Value Date/Time   TSH 1.950 12/31/2018 12:26 PM  TSH 1.650 03/11/2016 11:48 AM   FREET4 1.77 03/11/2016 11:48 AM    CBC Latest Ref Rng & Units 04/06/2020 04/04/2020 04/03/2020  WBC 4.0 - 10.5 K/uL 7.1 7.0 10.7(H)  Hemoglobin 12.0 - 15.0 g/dL 12.3 11.9(L) 12.3  Hematocrit 36.0 - 46.0 % 38.8 37.2 38.2  Platelets 150 - 400 K/uL 317 308 356    Lab Results  Component Value Date/Time   VD25OH 34.9 01/12/2019 12:00 PM   VD25OH 29.7 (L) 08/10/2018 12:42 PM    Clinical ASCVD: No  The ASCVD Risk score Mikey Bussing DC Jr., et al., 2013) failed to calculate for the  following reasons:   The 2013 ASCVD risk score is only valid for ages 74 to 75    Social History   Tobacco Use  Smoking Status Never  Smokeless Tobacco Never   BP Readings from Last 3 Encounters:  02/20/21 (!) 130/50  01/11/21 138/77  10/16/20 124/62   Pulse Readings from Last 3 Encounters:  01/11/21 87  10/16/20 77  06/13/20 83   Wt Readings from Last 3 Encounters:  01/11/21 168 lb (76.2 kg)  10/16/20 176 lb (79.8 kg)  06/13/20 174 lb 9.6 oz (79.2 kg)    Assessment: Review of patient past medical history, allergies, medications, health status, including review of consultants reports, laboratory and other test data, was performed as part of comprehensive evaluation and provision of chronic care management services.   SDOH:  (Social Determinants of Health) assessments and interventions performed:    CCM Care Plan  Allergies  Allergen Reactions   Hydrocortisone Itching   Lisinopril     Other reaction(s): Lethargy (intolerance)   Phenergan [Promethazine Hcl] Other (See Comments)    hallucinations   Statins Other (See Comments)    Severe myalgias   Trazodone And Nefazodone Cough   Prednisone Rash    Medications Reviewed Today     Reviewed by Beryle Lathe, [facility] (Pharmacist) on 02/23/21 at 1118  Med List Status: <None>   Medication Order Taking? Sig Documenting Provider Last Dose Status Informant  ACETAMINOPHEN EXTRA STRENGTH 500 MG tablet [id number] Yes TAKE (1) TABLET BY MOUTH EVERY (6) HOURS AS NEEDED FOR PAIN.**DO NOT EXCEED 8 TABLETS IN 24 HOURS**  Patient taking differently: 1,000 mg every 6 (six) hours as needed.   Kathyrn Drown, MD Taking Active   ALPRAZolam Duanne Moron) 0.25 MG tablet [id number] Yes TAKE 1 TABLET BY MOUTH TWICE A DAY. Kathyrn Drown, MD Taking Active   amLODipine (NORVASC) 10 MG tablet [id number] Yes Take 1 tablet (10 mg total) by mouth daily. Kathyrn Drown, MD Taking Active   beta carotene w/minerals (OCUVITE) tablet [id number] Yes TAKE  1 TABLET BY MOUTH AT BEDTIME. Kathyrn Drown, MD Taking Active   Carboxymethylcellulose Sodium (THERATEARS) 0.25 % SOLN [id number] Yes Apply 1 drop to eye 3 (three) times daily as needed (dry eyes). [provider] Taking Active   Cyanocobalamin (VITAMIN B-12) 2500 MCG SUBL [id number] Yes TAKE 1/2 TABLET BY MOUTH AT BEDTIME. Kathyrn Drown, MD Taking Active   dicyclomine (BENTYL) 10 MG capsule [id number] Yes 1 bid prn abd cramps and loose stool  Patient taking differently: Take 10 mg by mouth 2 (two) times daily as needed (Abdominal cramps and loose stools).   Kathyrn Drown, MD Taking Active   DROPSAFE SAFETY PEN NEEDLES 31G X 6 MM MISC [id number]  USE TO CHECK BLOOD SUGAR ONCE DAILY Kathyrn Drown, MD  Active   [facility] TEST test strip [id number]  CHECK  BLOOD SUGAR ONCE DAILY. Kathyrn Drown, MD  Active   famotidine (PEPCID) 20 MG tablet [id number] Yes TAKE 1 TABLET BY MOUTH EACH MORNING Luking, Elayne Snare, MD Taking Active   hydrALAZINE (APRESOLINE) 25 MG tablet [id number] Yes Take 1 tablet (25 mg total) by mouth 2 (two) times daily. Kathyrn Drown, MD Taking Active   hydrochlorothiazide (HYDRODIURIL) 25 MG tablet [id number] Yes Take 1 tablet (25 mg total) by mouth daily. Kathyrn Drown, MD Taking Active   hydrOXYzine (ATARAX/VISTARIL) 25 MG tablet [id number] Yes TAKE 1 TABLET BY MOUTH 3 TIMES A DAY AS NEEDED FOR ITCHING. Kathyrn Drown, MD Taking Active   losartan (COZAAR) 50 MG tablet [id number] Yes TAKE (1) TABLET BY MOUTH ONCE DAILY. Kathyrn Drown, MD Taking Active   metFORMIN (GLUCOPHAGE) 500 MG tablet [id number] Yes Take one tablet 573m po in the morning and 1/2 tablet 250 mg po at supper LKathyrn Drown MD Taking Active   Olopatadine HCl 0.2 % SOLN [id number] Place 1 drop into both eyes daily. [provider] Taking Active   pantoprazole (PROTONIX) 20 MG tablet [id number] TAKE (1) TABLET BY MOUTH ONCE DAILY. LKathyrn Drown MD Taking Active   QC HEMORRHOIDAL  0.25-14-74.9 % rectal ointment [id number] APPLY TO RECTUM 3 TIMES DAILY AS NEEDED FOR IRRITATION.  Patient not taking: Reported on 02/23/2021   LKathyrn Drown MD Not Taking Active   Vitamin D, Cholecalciferol, 10 MCG (400 UNIT) TABS [id number] TAKE 1 TABLET BY MOUTH ONCE DAILY. LKathyrn Drown MD Taking Active   Med List Note ([address] CPhT 04/03/20 [address]: [facility] ([phone]           Patient Active Problem List   Diagnosis Date Noted   Myalgia due to statin 02/15/2020   Cognitive dysfunction 03/04/2019   Generalized anxiety disorder 06/05/2018   Vomiting and diarrhea 03/08/2018   Dehydration 03/08/2018   Hyponatremia 03/08/2018   C. difficile diarrhea 03/03/2018   Acute lower UTI 03/03/2018   Hyperlipidemia associated with type 2 diabetes mellitus ([place] 10/02/2016   Pedal edema 10/02/2016   Insomnia 10/02/2016   SBO (small bowel obstruction) ([place] 07/02/2016   Dyspnea on exertion 02/24/2016   Other fatigue    E-coli UTI 06/18/2015   Leukocytosis 12/16/2014   Nausea and vomiting 12/15/2014   HLD (hyperlipidemia) 12/15/2014   Essential hypertension 12/15/2014   Controlled type 2 diabetes mellitus without complication, without long-term current use of insulin ([place] 02/15/2011    Immunization History  Administered Date(s) Administered   Fluad Quad(high Dose 65+) 04/04/2020   Influenza, High Dose Seasonal PF 03/09/2018   Influenza,inj,Quad PF,6+ Mos 03/28/2014, 03/23/2019   Influenza-Unspecified 03/12/2013, 04/16/2016, 04/03/2017, 04/04/2017   Moderna Sars-Covid-2 Vaccination 07/07/2019, 07/28/2019, 08/11/2020   PPD Test 02/09/2019   Pneumococcal Conjugate-13 03/28/2014   Pneumococcal Polysaccharide-23 07/01/2006, 03/23/2019   Tdap 05/01/2011, 01/05/2017   Zoster Recombinat (Shingrix) 04/17/2017, 06/17/2017   Zoster, Live 10/29/2007    Conditions to be addressed/monitored: HTN, HLD, and DMII  Care Plan : Medication Management   Updates made by WBeryle Lathe RMarionsince 02/23/2021 12:00 AM     Problem: HTN, DM2, HLD   Priority: High  Onset Date: 02/23/2021     Long-Range Goal: Disease Progression Prevention   Start Date: 02/23/2021  Expected End Date: 05/24/2021  This Visit's Progress: On track  Priority: High  Note:   Current Barriers:  Unable to independently monitor therapeutic efficacy Unable to achieve control  of hyperlipidemia  Pharmacist Clinical Goal(s):  Over the next 90  days, patient will Achieve control of hyperlipidemia as evidenced by improved LDL through collaboration with PharmD and provider.   Interventions: 1:1 collaboration with Kathyrn Drown, MD regarding development and update of comprehensive plan of care as evidenced by provider attestation and co-signature Inter-disciplinary care team collaboration (see longitudinal plan of care) Comprehensive medication review performed; medication list updated in electronic medical record  Type 2 Diabetes: Current medications: metformin 500 mg by mouth every morning and 250 mg by mouth  Intolerances: none Taking medications as directed: yes; adminitered by [facility] staff Side effects thought to be attributed to current medication regimen: no Denies hypoglycemic/hyperglycemic symptoms Hypoglycemia prevention: not indicated at this time Current meal patterns: breakfast: eggs, bacon, pancakes, waffles, oatmeal, and AVOIDS BREAD ; lunch: meat and two vegetables with fruit and occasionally cornbread; dinner:  meat and two vegetables with salad ; snacks: graham crackers, yogurt, wafers or half of a sandwhich with wheat bread; drinks: water or Gatorade Zero/BodyArmour (only a few sips per day) Current exercise:  walks at least 1 mile every morning On a statin: []  Yes  [x]  No, intolerant (severe myalgias)    Last microalbumin: 36.3 (11/30/20); on an ACEi/ARB: [x]  Yes  []  No    Last eye exam: appointment in Sept/Oct per patient;  last 4-5 months ago per patient Last foot exam: sees Dr, Caprice Beaver every 4 months Pneumonia vaccine: up to date/completed Current glucose readings: fasting blood glucose: within goal range of 80-130 mg/dL per ADA guidelines Controlled; Most recent A1c at goal of <7% per ADA guidelines Introduction to diabetes basic facts Medication: Identify diabetes medication and when best taken Monitoring: target blood sugar range, when to test Need for daily foot inspection and annual eye exam Continue metformin 500 mg by mouth every morning and 250 mg every evening  Instructed to monitor blood sugars once a day at the following times: fasting (at least 8 hours since last food consumption)  Hypertension: Current medications: losartan 50 mg by mouth once daily, amlodipine 10 mg by mouth once daily, hydrochlorothiazide 25 mg by mouth once daily, and hydralazine 25 mg by mouth every 12 hours Intolerances:  lisinopril (lethargy) Taking medications as directed: yes, adminitered by [facility] staff Side effects thought to be attributed to current medication regimen: no Denies dizziness, lightheadedness, blurred vision, and headache Home blood pressure readings: similar to in office readings per patient. [facility] checks periodically (usually Mon/Wed/Fri) Blood pressure under good control. Blood pressure is at goal of <130/80 mmHg per 2017 AHA/ACC guidelines. Continue losartan 50 mg by mouth once daily, amlodipine 10 mg by mouth once daily, hydrochlorothiazide 25 mg by mouth once daily, and hydralazine 25 mg by mouth every 12 hours Recommend home blood pressure monitoring, to bring results in next visit Discussed that if pill burden becomes an issue in the future, several of her blood pressure medications could be combined   Hyperlipidemia: Current medications:  none Intolerances:  statins (severe myalgias) Taking medications as directed: n/a Side effects thought to be  attributed to current medication regimen: n/a Uncontrolled; LDL above goal of <70 due to very high risk given diabetes + at least 1 additional major risk factor (advancing age, elevated LDL cholesterol, and hypertension) per 2020 AACE/ACE guidelines and TG above goal of <150 per 2020 AACE/ACE guidelines Consider adding ezetimibe 10 mg by mouth once daily Encourage dietary reduction of high fat containing foods such as butter, nuts,  bacon, egg yolks, etc. Reviewed risks of hyperlipidemia, principles of treatment and consequences of untreated hyperlipidemia Statin intolerance noted. No statin due to serious side effects (ex. Myalgias with at least 2 different statins)  Patient Goals/Self-Care Activities Over the next 90  days, patient will:  Take medications as prescribed Check blood sugar once a day at the following times: fasting (at least 8 hours since last food consumption), document, and provide at future appointments Check blood pressure a few times per week, document, and provide at future appointments  Follow Up Plan: Telephone follow up appointment with care management team member scheduled for: 03/23/21 Next PCP appointment scheduled for: 04/03/21      Medication Assistance: None required.  Patient affirms current coverage meets needs.  Patient's preferred pharmacy is:  [facility], [place] [facility] [address] [place] [address] Phone: [phone] Fax: [phone]  Uses adherence packaging with [facility]  Follow Up:  Patient agrees to Care Plan and Follow-up.  Plan: Telephone follow up appointment with care management team member scheduled for:  03/23/21 and Next PCP appointment scheduled for: 04/03/21  Kennon Holter, PharmD Clinical Pharmacist [place] [phone]

## 2021-02-23 NOTE — Patient Instructions (Signed)
[name],  It was great to talk to you today!  Please call me with any questions or concerns.   Visit Information   PATIENT GOALS:   Goals Addressed             This Visit's Progress    Medication Management       Patient Goals/Self-Care Activities Over the next 90  days, patient will:  Take medications as prescribed Check blood sugar once a day at the following times: fasting (at least 8 hours since last food consumption), document, and provide at future appointments Check blood pressure a few times per week, document, and provide at future appointments        Consent to CCM Services: Ms. Goughnour was given information about Chronic Care Management services including:  CCM service includes personalized support from designated clinical staff supervised by her physician, including individualized plan of care and coordination with other care providers 24/7 contact phone numbers for assistance for urgent and routine care needs. Service will only be billed when office clinical staff spend 20 minutes or more in a month to coordinate care. Only one practitioner may furnish and bill the service in a calendar month. The patient may stop CCM services at any time (effective at the end of the month) by phone call to the office staff. The patient will be responsible for cost sharing (co-pay) of up to 20% of the service fee (after annual deductible is met).  Patient agreed to services and verbal consent obtained.   The patient verbalized understanding of instructions, educational materials, and care plan provided today and agreed to receive a mailed copy of patient instructions, educational materials, and care plan.   Telephone follow up appointment with care management team member scheduled for: 03/23/21 at 11:30 AM Next PCP appointment scheduled for: 04/03/21  Kennon Holter, PharmD Clinical Pharmacist [facility] [phone]  CLINICAL CARE PLAN: Patient  Care Plan: Medication Management     Problem Identified: HTN, DM2, HLD   Priority: High  Onset Date: 02/23/2021     Long-Range Goal: Disease Progression Prevention   Start Date: 02/23/2021  Expected End Date: 05/24/2021  This Visit's Progress: On track  Priority: High  Note:   Current Barriers:  Unable to independently monitor therapeutic efficacy Unable to achieve control of hyperlipidemia  Pharmacist Clinical Goal(s):  Over the next 90  days, patient will Achieve control of hyperlipidemia as evidenced by improved LDL through collaboration with PharmD and provider.   Interventions: 1:1 collaboration with Kathyrn Drown, MD regarding development and update of comprehensive plan of care as evidenced by provider attestation and co-signature Inter-disciplinary care team collaboration (see longitudinal plan of care) Comprehensive medication review performed; medication list updated in electronic medical record  Type 2 Diabetes: Current medications: metformin 500 mg by mouth every morning and 250 mg by mouth  Intolerances: none Taking medications as directed: yes; adminitered by [facility] staff Side effects thought to be attributed to current medication regimen: no Denies hypoglycemic/hyperglycemic symptoms Hypoglycemia prevention: not indicated at this time Current meal patterns: breakfast: eggs, bacon, pancakes, waffles, oatmeal, and AVOIDS BREAD ; lunch: meat and two vegetables with fruit and occasionally cornbread; dinner:  meat and two vegetables with salad ; snacks: graham crackers, yogurt, wafers or half of a sandwhich with wheat bread; drinks: water or Gatorade Zero/BodyArmour (only a few sips per day) Current exercise:  walks at least 1 mile every morning On a statin: []  Yes  [x]  No, intolerant (severe  myalgias)    Last microalbumin: 36.3 (11/30/20); on an ACEi/ARB: [x]  Yes  []  No    Last eye exam: appointment in Sept/Oct per patient; last 4-5 months ago per  patient Last foot exam: sees Dr, Caprice Beaver every 4 months Pneumonia vaccine: up to date/completed Current glucose readings: fasting blood glucose: within goal range of 80-130 mg/dL per ADA guidelines Controlled; Most recent A1c at goal of <7% per ADA guidelines Introduction to diabetes basic facts Medication: Identify diabetes medication and when best taken Monitoring: target blood sugar range, when to test Need for daily foot inspection and annual eye exam Continue metformin 500 mg by mouth every morning and 250 mg every evening  Instructed to monitor blood sugars once a day at the following times: fasting (at least 8 hours since last food consumption)  Hypertension: Current medications: losartan 50 mg by mouth once daily, amlodipine 10 mg by mouth once daily, hydrochlorothiazide 25 mg by mouth once daily, and hydralazine 25 mg by mouth every 12 hours Intolerances:  lisinopril (lethargy) Taking medications as directed: yes, adminitered by [facility] staff Side effects thought to be attributed to current medication regimen: no Denies dizziness, lightheadedness, blurred vision, and headache Home blood pressure readings: similar to in office readings per patient. [facility] checks periodically (usually Mon/Wed/Fri) Blood pressure under good control. Blood pressure is at goal of <130/80 mmHg per 2017 AHA/ACC guidelines. Continue losartan 50 mg by mouth once daily, amlodipine 10 mg by mouth once daily, hydrochlorothiazide 25 mg by mouth once daily, and hydralazine 25 mg by mouth every 12 hours Recommend home blood pressure monitoring, to bring results in next visit Discussed that if pill burden becomes an issue in the future, several of her blood pressure medications could be combined   Hyperlipidemia: Current medications:  none Intolerances:  statins (severe myalgias) Taking medications as directed: n/a Side effects thought to be attributed to current medication  regimen: n/a Uncontrolled; LDL above goal of <70 due to very high risk given diabetes + at least 1 additional major risk factor (advancing age, elevated LDL cholesterol, and hypertension) per 2020 AACE/ACE guidelines and TG above goal of <150 per 2020 AACE/ACE guidelines Consider adding ezetimibe 10 mg by mouth once daily Encourage dietary reduction of high fat containing foods such as butter, nuts, bacon, egg yolks, etc. Reviewed risks of hyperlipidemia, principles of treatment and consequences of untreated hyperlipidemia Statin intolerance noted. No statin due to serious side effects (ex. Myalgias with at least 2 different statins)  Patient Goals/Self-Care Activities Over the next 90  days, patient will:  Take medications as prescribed Check blood sugar once a day at the following times: fasting (at least 8 hours since last food consumption), document, and provide at future appointments Check blood pressure a few times per week, document, and provide at future appointments  Follow Up Plan: Telephone follow up appointment with care management team member scheduled for: 03/23/21 Next PCP appointment scheduled for: 04/03/21

## 2021-02-25 ENCOUNTER — Emergency Department (HOSPITAL_COMMUNITY): Payer: Medicare Other

## 2021-02-25 ENCOUNTER — Inpatient Hospital Stay (HOSPITAL_COMMUNITY)
Admission: EM | Admit: 2021-02-25 | Discharge: 2021-02-27 | DRG: 177 | Disposition: A | Discharge status: Home Health | Payer: Medicare Other | Attending: Internal Medicine | Admitting: Internal Medicine

## 2021-02-25 ENCOUNTER — Encounter (HOSPITAL_COMMUNITY): Payer: Self-pay

## 2021-02-25 DIAGNOSIS — I129 Hypertensive chronic kidney disease with stage 1 through stage 4 chronic kidney disease, or unspecified chronic kidney disease: Secondary | ICD-10-CM | POA: Diagnosis present

## 2021-02-25 DIAGNOSIS — R079 Chest pain, unspecified: Secondary | ICD-10-CM | POA: Diagnosis not present

## 2021-02-25 DIAGNOSIS — J189 Pneumonia, unspecified organism: Secondary | ICD-10-CM

## 2021-02-25 DIAGNOSIS — R1110 Vomiting, unspecified: Secondary | ICD-10-CM | POA: Diagnosis not present

## 2021-02-25 DIAGNOSIS — A0839 Other viral enteritis: Secondary | ICD-10-CM | POA: Diagnosis present

## 2021-02-25 DIAGNOSIS — Z90710 Acquired absence of both cervix and uterus: Secondary | ICD-10-CM | POA: Diagnosis not present

## 2021-02-25 DIAGNOSIS — K8020 Calculus of gallbladder without cholecystitis without obstruction: Secondary | ICD-10-CM | POA: Diagnosis present

## 2021-02-25 DIAGNOSIS — E785 Hyperlipidemia, unspecified: Secondary | ICD-10-CM

## 2021-02-25 DIAGNOSIS — E1122 Type 2 diabetes mellitus with diabetic chronic kidney disease: Secondary | ICD-10-CM | POA: Diagnosis present

## 2021-02-25 DIAGNOSIS — Z7984 Long term (current) use of oral hypoglycemic drugs: Secondary | ICD-10-CM

## 2021-02-25 DIAGNOSIS — E869 Volume depletion, unspecified: Secondary | ICD-10-CM | POA: Diagnosis present

## 2021-02-25 DIAGNOSIS — J1282 Pneumonia due to coronavirus disease 2019: Secondary | ICD-10-CM | POA: Diagnosis present

## 2021-02-25 DIAGNOSIS — E871 Hypo-osmolality and hyponatremia: Secondary | ICD-10-CM | POA: Diagnosis present

## 2021-02-25 DIAGNOSIS — Z8249 Family history of ischemic heart disease and other diseases of the circulatory system: Secondary | ICD-10-CM | POA: Diagnosis not present

## 2021-02-25 DIAGNOSIS — Z833 Family history of diabetes mellitus: Secondary | ICD-10-CM

## 2021-02-25 DIAGNOSIS — E1169 Type 2 diabetes mellitus with other specified complication: Secondary | ICD-10-CM

## 2021-02-25 DIAGNOSIS — I10 Essential (primary) hypertension: Secondary | ICD-10-CM

## 2021-02-25 DIAGNOSIS — N1831 Chronic kidney disease, stage 3a: Secondary | ICD-10-CM | POA: Diagnosis present

## 2021-02-25 DIAGNOSIS — R0902 Hypoxemia: Secondary | ICD-10-CM | POA: Diagnosis not present

## 2021-02-25 DIAGNOSIS — I700 Atherosclerosis of aorta: Secondary | ICD-10-CM

## 2021-02-25 DIAGNOSIS — Z79899 Other long term (current) drug therapy: Secondary | ICD-10-CM

## 2021-02-25 DIAGNOSIS — R509 Fever, unspecified: Secondary | ICD-10-CM | POA: Diagnosis not present

## 2021-02-25 DIAGNOSIS — E119 Type 2 diabetes mellitus without complications: Secondary | ICD-10-CM | POA: Diagnosis not present

## 2021-02-25 DIAGNOSIS — E1151 Type 2 diabetes mellitus with diabetic peripheral angiopathy without gangrene: Secondary | ICD-10-CM

## 2021-02-25 DIAGNOSIS — R059 Cough, unspecified: Secondary | ICD-10-CM | POA: Diagnosis not present

## 2021-02-25 DIAGNOSIS — I517 Cardiomegaly: Secondary | ICD-10-CM | POA: Diagnosis not present

## 2021-02-25 DIAGNOSIS — U071 COVID-19: Secondary | ICD-10-CM | POA: Diagnosis present

## 2021-02-25 DIAGNOSIS — R109 Unspecified abdominal pain: Secondary | ICD-10-CM | POA: Diagnosis not present

## 2021-02-25 DIAGNOSIS — Z888 Allergy status to other drugs, medicaments and biological substances status: Secondary | ICD-10-CM | POA: Diagnosis not present

## 2021-02-25 DIAGNOSIS — F419 Anxiety disorder, unspecified: Secondary | ICD-10-CM | POA: Diagnosis not present

## 2021-02-25 DIAGNOSIS — R000 Tachycardia, unspecified: Secondary | ICD-10-CM | POA: Diagnosis not present

## 2021-02-25 DIAGNOSIS — J9601 Acute respiratory failure with hypoxia: Secondary | ICD-10-CM | POA: Diagnosis present

## 2021-02-25 DIAGNOSIS — J159 Unspecified bacterial pneumonia: Secondary | ICD-10-CM | POA: Diagnosis present

## 2021-02-25 DIAGNOSIS — R0600 Dyspnea, unspecified: Secondary | ICD-10-CM

## 2021-02-25 LAB — C-REACTIVE PROTEIN: CRP: 7.2 mg/dL — ABNORMAL HIGH (ref <1.0)

## 2021-02-25 LAB — APTT: aPTT: 35 seconds (ref 24–36)

## 2021-02-25 LAB — COMPREHENSIVE METABOLIC PANEL
ALT: 17 U/L (ref 0–44)
AST: 18 U/L (ref 15–41)
Albumin: 4.2 g/dL (ref 3.5–5.0)
Alkaline Phosphatase: 89 U/L (ref 38–126)
Anion gap: 11 (ref 5–15)
BUN: 13 mg/dL (ref 8–23)
CO2: 25 mmol/L (ref 22–32)
Calcium: 8.9 mg/dL (ref 8.9–10.3)
Chloride: 91 mmol/L — ABNORMAL LOW (ref 98–111)
Creatinine, Ser: 0.94 mg/dL (ref 0.44–1.00)
GFR, Estimated: 59 mL/min — ABNORMAL LOW (ref >60)
Glucose, Bld: 184 mg/dL — ABNORMAL HIGH (ref 70–99)
Potassium: 3.5 mmol/L (ref 3.5–5.1)
Sodium: 127 mmol/L — ABNORMAL LOW (ref 135–145)
Total Bilirubin: 1.4 mg/dL — ABNORMAL HIGH (ref 0.3–1.2)
Total Protein: 7.6 g/dL (ref 6.5–8.1)

## 2021-02-25 LAB — CBC WITH DIFFERENTIAL/PLATELET
Abs Immature Granulocytes: 0.13 10*3/uL — ABNORMAL HIGH (ref 0.00–0.07)
Basophils Absolute: 0 10*3/uL (ref 0.0–0.1)
Basophils Relative: 0 %
Eosinophils Absolute: 0 10*3/uL (ref 0.0–0.5)
Eosinophils Relative: 0 %
HCT: 38.2 % (ref 36.0–46.0)
Hemoglobin: 12.7 g/dL (ref 12.0–15.0)
Immature Granulocytes: 1 %
Lymphocytes Relative: 3 %
Lymphs Abs: 0.5 10*3/uL — ABNORMAL LOW (ref 0.7–4.0)
MCH: 29 pg (ref 26.0–34.0)
MCHC: 33.2 g/dL (ref 30.0–36.0)
MCV: 87.2 fL (ref 80.0–100.0)
Monocytes Absolute: 0.9 10*3/uL (ref 0.1–1.0)
Monocytes Relative: 5 %
Neutro Abs: 16.7 10*3/uL — ABNORMAL HIGH (ref 1.7–7.7)
Neutrophils Relative %: 91 %
Platelets: 248 10*3/uL (ref 150–400)
RBC: 4.38 MIL/uL (ref 3.87–5.11)
RDW: 13.8 % (ref 11.5–15.5)
WBC: 18.3 10*3/uL — ABNORMAL HIGH (ref 4.0–10.5)
nRBC: 0 % (ref 0.0–0.2)

## 2021-02-25 LAB — PROTIME-INR
INR: 1 (ref 0.8–1.2)
Prothrombin Time: 13.6 seconds (ref 11.4–15.2)

## 2021-02-25 LAB — LIPASE, BLOOD: Lipase: 18 U/L (ref 11–51)

## 2021-02-25 LAB — RESP PANEL BY RT-PCR (FLU A&B, COVID) ARPGX2
Influenza A by PCR: NEGATIVE
Influenza B by PCR: NEGATIVE
SARS Coronavirus 2 by RT PCR: POSITIVE — AB

## 2021-02-25 LAB — LACTIC ACID, PLASMA
Lactic Acid, Venous: 1.3 mmol/L (ref 0.5–1.9)
Lactic Acid, Venous: 1.2 mmol/L (ref 0.5–1.9)

## 2021-02-25 LAB — D-DIMER, QUANTITATIVE: D-Dimer, Quant: 0.9 ug/mL-FEU — ABNORMAL HIGH (ref 0.00–0.50)

## 2021-02-25 LAB — PROCALCITONIN: Procalcitonin: 0.23 ng/mL

## 2021-02-25 MED ORDER — SODIUM CHLORIDE 0.9 % IV SOLN
2.0000 g | Freq: Once | INTRAVENOUS | Status: AC
  Administered 2021-02-25: 2 g via INTRAVENOUS
  Filled 2021-02-25: qty 2

## 2021-02-25 MED ORDER — ONDANSETRON HCL 4 MG/2ML IJ SOLN: 4.0000 mg | Freq: Four times a day (QID) | INTRAMUSCULAR | Status: DC | PRN

## 2021-02-25 MED ORDER — DEXAMETHASONE SODIUM PHOSPHATE 10 MG/ML IJ SOLN
6.0000 mg | INTRAMUSCULAR | Status: DC
  Administered 2021-02-25 – 2021-02-26 (×2): 6 mg via INTRAVENOUS
  Filled 2021-02-25 (×2): qty 1

## 2021-02-25 MED ORDER — BENZONATATE 100 MG PO CAPS
100.0000 mg | ORAL_CAPSULE | Freq: Once | ORAL | Status: AC
  Administered 2021-02-25: 100 mg via ORAL
  Filled 2021-02-25: qty 1

## 2021-02-25 MED ORDER — SODIUM CHLORIDE 0.9 % IV SOLN: INTRAVENOUS | Status: DC

## 2021-02-25 MED ORDER — SODIUM CHLORIDE 0.9 % IV SOLN
500.0000 mg | INTRAVENOUS | Status: DC
  Administered 2021-02-26 (×2): 500 mg via INTRAVENOUS
  Filled 2021-02-25 (×2): qty 500

## 2021-02-25 MED ORDER — VANCOMYCIN HCL IN DEXTROSE 1-5 GM/200ML-% IV SOLN
1000.0000 mg | Freq: Once | INTRAVENOUS | Status: AC
Start: 2021-02-25 — End: 2021-02-25
  Administered 2021-02-25: 1000 mg via INTRAVENOUS
  Filled 2021-02-25: qty 200

## 2021-02-25 MED ORDER — PANTOPRAZOLE SODIUM 40 MG PO TBEC
40.0000 mg | DELAYED_RELEASE_TABLET | Freq: Every day | ORAL | Status: DC
  Filled 2021-02-25: qty 1

## 2021-02-25 MED ORDER — METRONIDAZOLE 500 MG/100ML IV SOLN
500.0000 mg | Freq: Once | INTRAVENOUS | Status: AC
Start: 2021-02-25 — End: 2021-02-25
  Administered 2021-02-25: 500 mg via INTRAVENOUS
  Filled 2021-02-25: qty 100

## 2021-02-25 MED ORDER — LOSARTAN POTASSIUM 50 MG PO TABS
50.0000 mg | ORAL_TABLET | Freq: Every day | ORAL | Status: DC
  Filled 2021-02-25: qty 1

## 2021-02-25 MED ORDER — SODIUM CHLORIDE 0.9 % IV SOLN
100.0000 mg | Freq: Every day | INTRAVENOUS | Status: DC
  Administered 2021-02-26 – 2021-02-27 (×2): 100 mg via INTRAVENOUS
  Filled 2021-02-25 (×3): qty 20

## 2021-02-25 MED ORDER — INSULIN ASPART 100 UNIT/ML IJ SOLN: 0.0000 [IU] | Freq: Every day | INTRAMUSCULAR | Status: DC

## 2021-02-25 MED ORDER — ACETAMINOPHEN 325 MG PO TABS
650.0000 mg | ORAL_TABLET | Freq: Once | ORAL | Status: AC
  Administered 2021-02-25: 650 mg via ORAL
  Filled 2021-02-25: qty 2

## 2021-02-25 MED ORDER — ALBUTEROL SULFATE HFA 108 (90 BASE) MCG/ACT IN AERS
4.0000 | INHALATION_SPRAY | Freq: Once | RESPIRATORY_TRACT | Status: AC
  Administered 2021-02-25: 4 via RESPIRATORY_TRACT
  Filled 2021-02-25: qty 6.7

## 2021-02-25 MED ORDER — ALPRAZOLAM 0.25 MG PO TABS
0.2500 mg | ORAL_TABLET | Freq: Two times a day (BID) | ORAL | Status: DC | PRN
  Administered 2021-02-26: 0.25 mg via ORAL
  Filled 2021-02-25: qty 1

## 2021-02-25 MED ORDER — SODIUM CHLORIDE 0.9 % IV SOLN
2.0000 g | INTRAVENOUS | Status: DC
  Administered 2021-02-26 (×2): 2 g via INTRAVENOUS
  Filled 2021-02-25 (×3): qty 20

## 2021-02-25 MED ORDER — DICYCLOMINE HCL 10 MG PO CAPS
10.0000 mg | ORAL_CAPSULE | Freq: Two times a day (BID) | ORAL | Status: DC | PRN
  Administered 2021-02-26: 10 mg via ORAL
  Filled 2021-02-25: qty 1

## 2021-02-25 MED ORDER — CEFEPIME HCL 2 G IJ SOLR: 2.0000 g | Freq: Two times a day (BID) | INTRAMUSCULAR | Status: DC

## 2021-02-25 MED ORDER — ONDANSETRON HCL 4 MG/2ML IJ SOLN
4.0000 mg | Freq: Once | INTRAMUSCULAR | Status: AC
  Administered 2021-02-25: 4 mg via INTRAVENOUS
  Filled 2021-02-25: qty 2

## 2021-02-25 MED ORDER — FAMOTIDINE 20 MG PO TABS: 20.0000 mg | ORAL_TABLET | Freq: Every morning | ORAL | Status: DC

## 2021-02-25 MED ORDER — AMLODIPINE BESYLATE 5 MG PO TABS
10.0000 mg | ORAL_TABLET | Freq: Every day | ORAL | Status: DC
  Administered 2021-02-26 – 2021-02-27 (×2): 10 mg via ORAL
  Filled 2021-02-25 (×2): qty 2

## 2021-02-25 MED ORDER — VANCOMYCIN HCL 750 MG/150ML IV SOLN
750.0000 mg | Freq: Two times a day (BID) | INTRAVENOUS | Status: DC
  Administered 2021-02-25: 750 mg via INTRAVENOUS
  Filled 2021-02-25 (×3): qty 150

## 2021-02-25 MED ORDER — HYDROXYZINE HCL 25 MG PO TABS: 25.0000 mg | ORAL_TABLET | Freq: Three times a day (TID) | ORAL | Status: DC | PRN

## 2021-02-25 MED ORDER — ENOXAPARIN SODIUM 40 MG/0.4ML IJ SOSY
40.0000 mg | PREFILLED_SYRINGE | INTRAMUSCULAR | Status: DC
  Administered 2021-02-26 – 2021-02-27 (×2): 40 mg via SUBCUTANEOUS
  Filled 2021-02-25 (×3): qty 0.4

## 2021-02-25 MED ORDER — HYDRALAZINE HCL 25 MG PO TABS
25.0000 mg | ORAL_TABLET | Freq: Two times a day (BID) | ORAL | Status: DC
  Administered 2021-02-26: 25 mg via ORAL
  Filled 2021-02-25 (×3): qty 1

## 2021-02-25 MED ORDER — INSULIN ASPART 100 UNIT/ML IJ SOLN
0.0000 [IU] | Freq: Three times a day (TID) | INTRAMUSCULAR | Status: DC
  Administered 2021-02-26: 3 [IU] via SUBCUTANEOUS
  Administered 2021-02-26 (×2): 2 [IU] via SUBCUTANEOUS
  Administered 2021-02-27 (×2): 3 [IU] via SUBCUTANEOUS

## 2021-02-25 MED ORDER — IOHEXOL 350 MG/ML SOLN
85.0000 mL | Freq: Once | INTRAVENOUS | Status: AC | PRN
  Administered 2021-02-25: 85 mL via INTRAVENOUS

## 2021-02-25 MED ORDER — SODIUM CHLORIDE 0.9 % IV BOLUS
1000.0000 mL | Freq: Once | INTRAVENOUS | Status: AC
  Administered 2021-02-25: 1000 mL via INTRAVENOUS

## 2021-02-25 MED ORDER — INSULIN DETEMIR 100 UNIT/ML ~~LOC~~ SOLN
10.0000 [IU] | Freq: Every day | SUBCUTANEOUS | Status: DC
  Filled 2021-02-25 (×2): qty 0.1

## 2021-02-25 MED ORDER — ONDANSETRON HCL 4 MG PO TABS: 4.0000 mg | ORAL_TABLET | Freq: Four times a day (QID) | ORAL | Status: DC | PRN

## 2021-02-25 MED ORDER — REMDESIVIR 100 MG IV SOLR
100.0000 mg | INTRAVENOUS | Status: AC
  Administered 2021-02-25 – 2021-02-26 (×2): 100 mg via INTRAVENOUS
  Filled 2021-02-25: qty 20

## 2021-02-25 NOTE — H&P (Signed)
History and Physical  [name] [id number] DOB: 02-19-1932 DOA: 02/25/2021  Referring physician: Coral Ceo, PA-C, EDP PCP: Kathyrn Drown, MD  Outpatient Specialists:  Patient Coming From: [place]  Chief Complaint: Cough, fevers  HPI: [name] is a 87 y.o. female with a history of diabetes, hypertension, hyperlipidemia.  Patient is a resident at [facility] assisted living facility. She was diagnosed with COVID about 12 days ago due to high census of COVID infections in the nursing facility. At the time, the patient was not symptomatic.  She remained symptomatic until yesterday, when she started having fevers, cough, shortness of breath.  She was transferred here for evaluation.  No palliating or provoking factors.  She has lost her appetite.  Emergency Department Course: COVID-positive.  CT chest shows left lower lobe pneumonia.  White count 18.  Review of Systems:  Pt denies any fevers, chills, nausea, vomiting, diarrhea, constipation, abdominal pain,  palpitations, headache, vision changes, lightheadedness, dizziness, melena, rectal bleeding.  Review of systems are otherwise negative  Past Medical History:  Diagnosis Date   Cognitive dysfunction 03/04/2019   Patient scores 21 out of 30 on [place] cognitive assessment September 2020   Diabetes mellitus without complication ([place])    Diverticulitis    Frailty 03/04/2019   H/O bilateral breast reduction surgery    Hyperlipidemia    a. intolerant to statins.    Hypertension    Past Surgical History:  Procedure Laterality Date   ABDOMINAL HYSTERECTOMY     APPENDECTOMY     BREAST REDUCTION SURGERY     COLON SURGERY Left    Hemicolectomy due to diverticulitis   EYE SURGERY  03/30/2009   cataract   KNEE SURGERY     LAPAROSCOPIC INCISIONAL / UMBILICAL / VENTRAL HERNIA REPAIR  02/23/2007    REDUCTION MAMMAPLASTY Bilateral 2001   REFRACTIVE SURGERY     Social History:  reports that she has never smoked. She has  never used smokeless tobacco. She reports that she does not drink alcohol and does not use drugs. Patient lives at home  Allergies  Allergen Reactions   Hydrocortisone Itching   Lisinopril     Other reaction(s): Lethargy (intolerance)   Phenergan [Promethazine Hcl] Other (See Comments)    hallucinations   Statins Other (See Comments)    Severe myalgias   Trazodone And Nefazodone Cough   Prednisone Rash    Family History  Problem Relation Age of Onset   Hypertension Father        kidney   Stroke Father    Cancer Father    Cancer Mother        ovaian   Diabetes Brother    Hypertension Sister       Prior to Admission medications   Medication Sig Start Date End Date Taking? Authorizing Provider  ACETAMINOPHEN EXTRA STRENGTH 500 MG tablet TAKE (1) TABLET BY MOUTH EVERY (6) HOURS AS NEEDED FOR PAIN.**DO NOT EXCEED 8 TABLETS IN 24 HOURS** Patient taking differently: 1,000 mg every 6 (six) hours as needed. 06/20/20  Yes Kathyrn Drown, MD  ALPRAZolam Duanne Moron) 0.25 MG tablet TAKE 1 TABLET BY MOUTH TWICE A DAY. 10/16/20  Yes Luking, Scott A, MD  amLODipine (NORVASC) 10 MG tablet Take 1 tablet (10 mg total) by mouth daily. 10/16/20  Yes Kathyrn Drown, MD  beta carotene w/minerals (OCUVITE) tablet TAKE 1 TABLET BY MOUTH AT BEDTIME. 04/16/20  Yes Luking, Elayne Snare, MD  Carboxymethylcellulose Sodium (THERATEARS) 0.25 % SOLN Apply 1  drop to eye 3 (three) times daily as needed (dry eyes).   Yes [provider]  Cyanocobalamin (VITAMIN B-12) 2500 MCG SUBL TAKE 1/2 TABLET BY MOUTH AT BEDTIME. 07/11/20  Yes Luking, Elayne Snare, MD  dicyclomine (BENTYL) 10 MG capsule 1 bid prn abd cramps and loose stool Patient taking differently: Take 10 mg by mouth 2 (two) times daily as needed (Abdominal cramps and loose stools). 03/23/19  Yes Kathyrn Drown, MD  DROPSAFE SAFETY PEN NEEDLES 31G X 6 MM MISC USE TO CHECK BLOOD SUGAR ONCE DAILY 01/05/21  Yes Luking, Elayne Snare, MD  EASYMAX TEST test strip CHECK  BLOOD SUGAR ONCE DAILY. 12/05/20  Yes Luking, Elayne Snare, MD  famotidine (PEPCID) 20 MG tablet TAKE 1 TABLET BY MOUTH EACH MORNING Patient taking differently: Take 20 mg by mouth every morning. 07/11/20  Yes Kathyrn Drown, MD  hydrALAZINE (APRESOLINE) 25 MG tablet Take 1 tablet (25 mg total) by mouth 2 (two) times daily. 10/16/20  Yes Kathyrn Drown, MD  hydrochlorothiazide (HYDRODIURIL) 25 MG tablet Take 1 tablet (25 mg total) by mouth daily. 10/16/20  Yes Kathyrn Drown, MD  hydrOXYzine (ATARAX/VISTARIL) 25 MG tablet TAKE 1 TABLET BY MOUTH 3 TIMES A DAY AS NEEDED FOR ITCHING. 01/26/21  Yes Luking, Elayne Snare, MD  losartan (COZAAR) 50 MG tablet TAKE (1) TABLET BY MOUTH ONCE DAILY. 10/16/20  Yes Kathyrn Drown, MD  metFORMIN (GLUCOPHAGE) 500 MG tablet Take one tablet '500mg'$  po in the morning and 1/2 tablet 250 mg po at supper 10/16/20  Yes Luking, Scott A, MD  Olopatadine HCl 0.2 % SOLN Place 1 drop into both eyes daily.   Yes [provider]  pantoprazole (PROTONIX) 20 MG tablet TAKE (1) TABLET BY MOUTH ONCE DAILY. 10/16/20  Yes Kathyrn Drown, MD  Vitamin D, Cholecalciferol, 10 MCG (400 UNIT) TABS TAKE 1 TABLET BY MOUTH ONCE DAILY. 05/12/20  Yes Luking, Elayne Snare, MD  QC HEMORRHOIDAL 0.25-14-74.9 % rectal ointment APPLY TO RECTUM 3 TIMES DAILY AS NEEDED FOR IRRITATION. Patient not taking: No sig reported 10/30/19   Kathyrn Drown, MD    Physical Exam: BP (!) 133/59   Pulse 79   Temp 99.4 F (37.4 C) (Oral)   Resp 17   Ht '5\' 8"'$  (1.727 m)   Wt 84 kg   SpO2 94%   BMI 28.16 kg/m   General: Elderly female. Awake and alert and oriented x3. No acute cardiopulmonary distress.  HEENT: Normocephalic atraumatic.  Right and left ears normal in appearance.  Pupils equal, round, reactive to light. Extraocular muscles are intact. Sclerae anicteric and noninjected.  Moist mucosal membranes. No mucosal lesions.  Neck: Neck supple without lymphadenopathy. No carotid bruits. No masses palpated.   Cardiovascular: Regular rate with normal S1-S2 sounds. No murmurs, rubs, gallops auscultated. No JVD.  Respiratory: Rales in left base. No accessory muscle use. Abdomen: Soft, nontender, nondistended. Active bowel sounds. No masses or hepatosplenomegaly  Skin: No rashes, lesions, or ulcerations.  Dry, warm to touch. 2+ dorsalis pedis and radial pulses. Musculoskeletal: No calf or leg pain. All major joints not erythematous nontender.  No upper or lower joint deformation.  Good ROM.  No contractures  Psychiatric: Intact judgment and insight. Pleasant and cooperative. Neurologic: No focal neurological deficits. Strength is 5/5 and symmetric in upper and lower extremities.  Cranial nerves II through XII are grossly intact.           Labs on Admission: I have personally reviewed  following labs and imaging studies  CBC: Recent Labs  Lab 02/25/21 1437  WBC 18.3*  NEUTROABS 16.7*  HGB 12.7  HCT 38.2  MCV 87.2  PLT [id number]   Basic Metabolic Panel: Recent Labs  Lab 02/25/21 1437  NA 127*  K 3.5  CL 91*  CO2 25  GLUCOSE 184*  BUN 13  CREATININE 0.94  CALCIUM 8.9   GFR: Estimated Creatinine Clearance: 47.9 mL/min (by C-G formula based on SCr of 0.94 mg/dL). Liver Function Tests: Recent Labs  Lab 02/25/21 1437  AST 18  ALT 17  ALKPHOS 89  BILITOT 1.4*  PROT 7.6  ALBUMIN 4.2   Recent Labs  Lab 02/25/21 1437  LIPASE 18   No results for input(s): AMMONIA in the last 168 hours. Coagulation Profile: Recent Labs  Lab 02/25/21 1509  INR 1.0   Cardiac Enzymes: No results for input(s): CKTOTAL, CKMB, CKMBINDEX, TROPONINI in the last 168 hours. BNP (last 3 results) No results for input(s): PROBNP in the last 8760 hours. HbA1C: No results for input(s): HGBA1C in the last 72 hours. CBG: No results for input(s): GLUCAP in the last 168 hours. Lipid Profile: No results for input(s): CHOL, HDL, LDLCALC, TRIG, CHOLHDL, LDLDIRECT in the last 72 hours. Thyroid Function  Tests: No results for input(s): TSH, T4TOTAL, FREET4, T3FREE, THYROIDAB in the last 72 hours. Anemia Panel: No results for input(s): VITAMINB12, FOLATE, FERRITIN, TIBC, IRON, RETICCTPCT in the last 72 hours. Urine analysis:    Component Value Date/Time   COLORURINE YELLOW 04/02/2020 2003   APPEARANCEUR CLOUDY (A) 04/02/2020 2003   LABSPEC 1.013 04/02/2020 2003   PHURINE 8.0 04/02/2020 2003   GLUCOSEU NEGATIVE 04/02/2020 2003   HGBUR NEGATIVE 04/02/2020 2003   [place] NEGATIVE 04/02/2020 2003   [place] NEGATIVE 04/02/2020 2003   PROTEINUR 30 (A) 04/02/2020 2003   UROBILINOGEN 0.2 12/15/2014 1956   NITRITE NEGATIVE 04/02/2020 2003   LEUKOCYTESUR LARGE (A) 04/02/2020 2003   Sepsis Labs: '@LABRCNTIP'$ (procalcitonin:4,lacticidven:4) ) Recent Results (from the past 240 hour(s))  Blood culture (routine x 2)     Status: None (Preliminary result)   Collection Time: 02/25/21  4:58 PM   Specimen: Vein; Blood  Result Value Ref Range Status   Specimen Description   Final    RIGHT ANTECUBITAL BOTTLES DRAWN AEROBIC AND ANAEROBIC   Special Requests   Final    Blood Culture adequate volume Performed at [facility], [address], [place], [place] [address]    Culture PENDING  Incomplete   Report Status PENDING  Incomplete  Resp Panel by RT-PCR (Flu A&B, Covid) Nasopharyngeal Swab     Status: Abnormal   Collection Time: 02/25/21  5:05 PM   Specimen: Nasopharyngeal Swab; Nasopharyngeal(NP) swabs in vial transport medium  Result Value Ref Range Status   SARS Coronavirus 2 by RT PCR POSITIVE (A) NEGATIVE Final    Comment: CRITICAL RESULT CALLED TO, READ BACK BY AND VERIFIED WITH: DAVIS,N [id number] 02/25/2021 COLEMAN,R (NOTE) SARS-CoV-2 target nucleic acids are DETECTED.  The SARS-CoV-2 RNA is generally detectable in upper respiratory specimens during the acute phase of infection. Positive results are indicative of the presence of the identified virus, but do not rule out bacterial  infection or co-infection with other pathogens not detected by the test. Clinical correlation with patient history and other diagnostic information is necessary to determine patient infection status. The expected result is Negative.  Fact Sheet for Patients: EntrepreneurPulse.com.au  Fact Sheet for Healthcare Providers: IncredibleEmployment.be  This test is not yet approved or  cleared by the [place] and  has been authorized for detection and/or diagnosis of SARS-CoV-2 by FDA under an Emergency Use Authorization (EUA).  This EUA will remain in effect (meaning this te st can be used) for the duration of  the COVID-19 declaration under Section 564(b)(1) of the Act, 21 U.S.C. section 360bbb-3(b)(1), unless the authorization is terminated or revoked sooner.     Influenza A by PCR NEGATIVE NEGATIVE Final   Influenza B by PCR NEGATIVE NEGATIVE Final    Comment: (NOTE) The Xpert Xpress SARS-CoV-2/FLU/RSV plus assay is intended as an aid in the diagnosis of influenza from Nasopharyngeal swab specimens and should not be used as a sole basis for treatment. Nasal washings and aspirates are unacceptable for Xpert Xpress SARS-CoV-2/FLU/RSV testing.  Fact Sheet for Patients: EntrepreneurPulse.com.au  Fact Sheet for Healthcare Providers: IncredibleEmployment.be  This test is not yet approved or cleared by the [place] FDA and has been authorized for detection and/or diagnosis of SARS-CoV-2 by FDA under an Emergency Use Authorization (EUA). This EUA will remain in effect (meaning this test can be used) for the duration of the COVID-19 declaration under Section 564(b)(1) of the Act, 21 U.S.C. section 360bbb-3(b)(1), unless the authorization is terminated or revoked.  Performed at [facility], [address], [place], [place] [address]      Radiological Exams on Admission: CT Angio Chest PE W and/or Wo  Contrast  Result Date: 02/25/2021 CLINICAL DATA:  Vomiting, fever, congestion, abdominal pain. Positive COVID 12 days ago. Hx of DM, HTN, Diverticulitis, hemicolectomy, hysterectomy, hernia repair. EXAM: CT ANGIOGRAPHY CHEST CT ABDOMEN AND PELVIS WITH CONTRAST TECHNIQUE: Multidetector CT imaging of the chest was performed using the standard protocol during bolus administration of intravenous contrast. Multiplanar CT image reconstructions and MIPs were obtained to evaluate the vascular anatomy. Multidetector CT imaging of the abdomen and pelvis was performed using the standard protocol during bolus administration of intravenous contrast. CONTRAST:  40m OMNIPAQUE IOHEXOL 350 MG/ML SOLN COMPARISON:  CT abdomen pelvis 04/02/2020 FINDINGS: CTA CHEST FINDINGS Cardiovascular: Satisfactory opacification of the pulmonary arteries to the segmental level. No evidence of pulmonary embolism. The main pulmonary artery is normal in caliber. Normal heart size. No significant pericardial effusion. The thoracic aorta is normal in caliber. Severe calcified and noncalcified atherosclerotic plaque of the thoracic aorta. At least 2 vessel coronary artery calcifications. Possible small hiatal hernia. Mediastinum/Nodes: No enlarged mediastinal, hilar, or axillary lymph nodes. Thyroid gland, trachea, and esophagus demonstrate no significant findings. Lungs/Pleura: Left lower lobe patchy airspace opacity with associated bronchial wall thickening and mucous plugging. Scattered pulmonary micronodules. No pulmonary mass. No pleural effusion. No pneumothorax. Diffuse bronchial wall thickening. Musculoskeletal: No chest wall abnormality. No suspicious lytic or blastic osseous lesions. No acute displaced fracture. Multilevel degenerative changes of the spine. Review of the MIP images confirms the above findings. CT ABDOMEN and PELVIS FINDINGS Hepatobiliary: Subcentimeter hypodensities too small to characterize. No focal liver abnormality.  Multiple calcified gallstones are noted within the gallbladder lumen. No gallbladder wall thickening or pericholecystic fluid. No biliary dilatation. Pancreas: Diffusely atrophic. No focal lesion. Otherwise normal pancreatic contour. No surrounding inflammatory changes. No main pancreatic ductal dilatation. Spleen: Normal in size without focal abnormality. Adrenals/Urinary Tract: No adrenal nodule bilaterally. Bilateral kidneys enhance symmetrically. Bilateral renal cortical scarring. No hydronephrosis. No hydroureter. The urinary bladder is unremarkable. On delayed imaging, there is no urothelial wall thickening and there are no filling defects in the opacified portions of the bilateral collecting systems or ureters. Stomach/Bowel: Stomach  is within normal limits. No evidence of bowel wall thickening or dilatation. Scattered colonic diverticulosis. The appendix not definitely identified with no right lower quadrant inflammatory changes to suggest acute appendicitis. Vascular/Lymphatic: No abdominal aorta or iliac aneurysm. Severe atherosclerotic plaque of the aorta and its branches. No abdominal, pelvic, or inguinal lymphadenopathy. Reproductive: Status post hysterectomy. No adnexal masses. Other: No intraperitoneal free fluid. No intraperitoneal free gas. No organized fluid collection. Musculoskeletal: Status post ventral wall hernia repair with mesh. Diastasis rectus but no recurrent hernia. No suspicious lytic or blastic osseous lesions. No acute displaced fracture. Multilevel degenerative changes of the spine. Lipomatous lesion along the anterior aspect of the right scapula (4:21). Review of the MIP images confirms the above findings. IMPRESSION: 1. No pulmonary embolus. 2. Left lower lobe infection/inflammation with associated bronchial wall thickening and mucous plugging. 3. Cholelithiasis with no acute cholecystitis or choledocholithiasis. 4. Scattered colonic diverticulosis with no acute diverticulitis. 5.   Aortic Atherosclerosis (ICD10-I70.0). Electronically Signed   By: Iven Finn M.D.   On: 02/25/2021 18:43   CT ABDOMEN PELVIS W CONTRAST  Result Date: 02/25/2021 CLINICAL DATA:  Vomiting, fever, congestion, abdominal pain. Positive COVID 12 days ago. Hx of DM, HTN, Diverticulitis, hemicolectomy, hysterectomy, hernia repair. EXAM: CT ANGIOGRAPHY CHEST CT ABDOMEN AND PELVIS WITH CONTRAST TECHNIQUE: Multidetector CT imaging of the chest was performed using the standard protocol during bolus administration of intravenous contrast. Multiplanar CT image reconstructions and MIPs were obtained to evaluate the vascular anatomy. Multidetector CT imaging of the abdomen and pelvis was performed using the standard protocol during bolus administration of intravenous contrast. CONTRAST:  68m OMNIPAQUE IOHEXOL 350 MG/ML SOLN COMPARISON:  CT abdomen pelvis 04/02/2020 FINDINGS: CTA CHEST FINDINGS Cardiovascular: Satisfactory opacification of the pulmonary arteries to the segmental level. No evidence of pulmonary embolism. The main pulmonary artery is normal in caliber. Normal heart size. No significant pericardial effusion. The thoracic aorta is normal in caliber. Severe calcified and noncalcified atherosclerotic plaque of the thoracic aorta. At least 2 vessel coronary artery calcifications. Possible small hiatal hernia. Mediastinum/Nodes: No enlarged mediastinal, hilar, or axillary lymph nodes. Thyroid gland, trachea, and esophagus demonstrate no significant findings. Lungs/Pleura: Left lower lobe patchy airspace opacity with associated bronchial wall thickening and mucous plugging. Scattered pulmonary micronodules. No pulmonary mass. No pleural effusion. No pneumothorax. Diffuse bronchial wall thickening. Musculoskeletal: No chest wall abnormality. No suspicious lytic or blastic osseous lesions. No acute displaced fracture. Multilevel degenerative changes of the spine. Review of the MIP images confirms the above findings.  CT ABDOMEN and PELVIS FINDINGS Hepatobiliary: Subcentimeter hypodensities too small to characterize. No focal liver abnormality. Multiple calcified gallstones are noted within the gallbladder lumen. No gallbladder wall thickening or pericholecystic fluid. No biliary dilatation. Pancreas: Diffusely atrophic. No focal lesion. Otherwise normal pancreatic contour. No surrounding inflammatory changes. No main pancreatic ductal dilatation. Spleen: Normal in size without focal abnormality. Adrenals/Urinary Tract: No adrenal nodule bilaterally. Bilateral kidneys enhance symmetrically. Bilateral renal cortical scarring. No hydronephrosis. No hydroureter. The urinary bladder is unremarkable. On delayed imaging, there is no urothelial wall thickening and there are no filling defects in the opacified portions of the bilateral collecting systems or ureters. Stomach/Bowel: Stomach is within normal limits. No evidence of bowel wall thickening or dilatation. Scattered colonic diverticulosis. The appendix not definitely identified with no right lower quadrant inflammatory changes to suggest acute appendicitis. Vascular/Lymphatic: No abdominal aorta or iliac aneurysm. Severe atherosclerotic plaque of the aorta and its branches. No abdominal, pelvic, or inguinal lymphadenopathy. Reproductive: Status post hysterectomy.  No adnexal masses. Other: No intraperitoneal free fluid. No intraperitoneal free gas. No organized fluid collection. Musculoskeletal: Status post ventral wall hernia repair with mesh. Diastasis rectus but no recurrent hernia. No suspicious lytic or blastic osseous lesions. No acute displaced fracture. Multilevel degenerative changes of the spine. Lipomatous lesion along the anterior aspect of the right scapula (4:21). Review of the MIP images confirms the above findings. IMPRESSION: 1. No pulmonary embolus. 2. Left lower lobe infection/inflammation with associated bronchial wall thickening and mucous plugging. 3.  Cholelithiasis with no acute cholecystitis or choledocholithiasis. 4. Scattered colonic diverticulosis with no acute diverticulitis. 5.  Aortic Atherosclerosis (ICD10-I70.0). Electronically Signed   By: Iven Finn M.D.   On: 02/25/2021 18:43   DG Chest Portable 1 View  Result Date: 02/25/2021 CLINICAL DATA:  Cough.  COVID positive 12 days ago.  Fever. EXAM: PORTABLE CHEST 1 VIEW COMPARISON:  Chest x-ray dated 07/09/2019. FINDINGS: Heart size and mediastinal contours are stable. Lungs are clear. No pleural effusion or pneumothorax is seen. Osseous structures about the chest are unremarkable. IMPRESSION: No active cardiopulmonary disease. No evidence of pneumonia or pulmonary edema. Electronically Signed   By: Franki Cabot M.D.   On: 02/25/2021 15:20    EKG: Independently reviewed.  Sinus rhythm old anterior infarct.  Slight ST depression in lateral leads.  Assessment/Plan: Principal Problem:   CAP (community acquired pneumonia) Active Problems:   Controlled type 2 diabetes mellitus without complication, without long-term current use of insulin ([place])   Essential hypertension   Hyperlipidemia associated with type 2 diabetes mellitus ([place])   Hyponatremia   COVID-19 virus infection    This patient was discussed with the ED physician, including pertinent vitals, physical exam findings, labs, and imaging.  We also discussed care given by the ED provider.  Community-acquired pneumonia, COVID-19 pneumonia Likely superimposed bacterial pneumonia on COVID-19 pneumonia Change antibiotics to Rocephin and azithromycin Add dexamethasone and remdesivir Strep pneumonia antigen, Legionella antigen Sputum culture Supplemental oxygen as needed Hypertension Continue Antihypertensives Hyperlipidemia Mild hyponatremia IVF - gentle hydration Check sodium in AM.  DVT prophylaxis: Lovenox Consultants: none Code Status: Full Code Family Communication: none  Disposition Plan: return to [place], DO

## 2021-02-25 NOTE — ED Provider Notes (Signed)
[facility] EMERGENCY DEPARTMENT Provider Note   CSN: [id number] Arrival date & time: 02/25/21  1359     History No chief complaint on file.   [name] is a 87 y.o. female.  HPI  87 year old female with a history of cognitive dysfunction, diabetes, diverticulosis, frailty, hyperlipidemia, hypertension, who presents to the emergency department today for evaluation of nausea and vomiting.  Patient states that she tested positive for COVID on 02/13/2021 however she did not have any symptoms until yesterday when she started experiencing coughing, nausea and vomiting.  She is unable to keep anything down for the last 24 hours.  She denies diarrhea, abdominal pain, chest pain, shortness of breath.  She has no fevers.  Past Medical History:  Diagnosis Date   Cognitive dysfunction 03/04/2019   Patient scores 21 out of 30 on [place] cognitive assessment September 2020   Diabetes mellitus without complication ([place])    Diverticulitis    Frailty 03/04/2019   H/O bilateral breast reduction surgery    Hyperlipidemia    a. intolerant to statins.    Hypertension     Patient Active Problem List   Diagnosis Date Noted   Myalgia due to statin 02/15/2020   Cognitive dysfunction 03/04/2019   Generalized anxiety disorder 06/05/2018   Vomiting and diarrhea 03/08/2018   Dehydration 03/08/2018   Hyponatremia 03/08/2018   C. difficile diarrhea 03/03/2018   Acute lower UTI 03/03/2018   Hyperlipidemia associated with type 2 diabetes mellitus ([place]) 10/02/2016   Pedal edema 10/02/2016   Insomnia 10/02/2016   SBO (small bowel obstruction) ([place]) 07/02/2016   Dyspnea on exertion 02/24/2016   Other fatigue    E-coli UTI 06/18/2015   Leukocytosis 12/16/2014   Nausea and vomiting 12/15/2014   HLD (hyperlipidemia) 12/15/2014   Essential hypertension 12/15/2014   Controlled type 2 diabetes mellitus without complication, without long-term current use of insulin ([place]) 02/15/2011    Past Surgical  History:  Procedure Laterality Date   ABDOMINAL HYSTERECTOMY     APPENDECTOMY     BREAST REDUCTION SURGERY     COLON SURGERY Left    Hemicolectomy due to diverticulitis   EYE SURGERY  03/30/2009   cataract   KNEE SURGERY     LAPAROSCOPIC INCISIONAL / UMBILICAL / [place]  02/23/2007    REDUCTION MAMMAPLASTY Bilateral 2001   REFRACTIVE SURGERY       OB History     Gravida      Para      Term      Preterm      AB      Living  1      SAB      IAB      Ectopic      Multiple      Live Births              Family History  Problem Relation Age of Onset   Hypertension Father        kidney   Stroke Father    Cancer Father    Cancer Mother        ovaian   Diabetes Brother    Hypertension Sister     Social History   Tobacco Use   Smoking status: Never   Smokeless tobacco: Never  Vaping Use   Vaping Use: Never used  Substance Use Topics   Alcohol use: No   Drug use: No    Home Medications Prior to Admission medications   Medication Sig  Start Date End Date Taking? Authorizing Provider  ACETAMINOPHEN EXTRA STRENGTH 500 MG tablet TAKE (1) TABLET BY MOUTH EVERY (6) HOURS AS NEEDED FOR PAIN.**DO NOT EXCEED 8 TABLETS IN 24 HOURS** Patient taking differently: 1,000 mg every 6 (six) hours as needed. 06/20/20  Yes Kathyrn Drown, MD  ALPRAZolam Duanne Moron) 0.25 MG tablet TAKE 1 TABLET BY MOUTH TWICE A DAY. 10/16/20  Yes Luking, Scott A, MD  amLODipine (NORVASC) 10 MG tablet Take 1 tablet (10 mg total) by mouth daily. 10/16/20  Yes Kathyrn Drown, MD  beta carotene w/minerals (OCUVITE) tablet TAKE 1 TABLET BY MOUTH AT BEDTIME. 04/16/20  Yes Luking, Elayne Snare, MD  Carboxymethylcellulose Sodium (THERATEARS) 0.25 % SOLN Apply 1 drop to eye 3 (three) times daily as needed (dry eyes).   Yes [provider]  Cyanocobalamin (VITAMIN B-12) 2500 MCG SUBL TAKE 1/2 TABLET BY MOUTH AT BEDTIME. 07/11/20  Yes Luking, Elayne Snare, MD  dicyclomine (BENTYL) 10 MG  capsule 1 bid prn abd cramps and loose stool Patient taking differently: Take 10 mg by mouth 2 (two) times daily as needed (Abdominal cramps and loose stools). 03/23/19  Yes Kathyrn Drown, MD  DROPSAFE SAFETY PEN NEEDLES 31G X 6 MM MISC USE TO CHECK BLOOD SUGAR ONCE DAILY 01/05/21  Yes Luking, Elayne Snare, MD  EASYMAX TEST test strip CHECK BLOOD SUGAR ONCE DAILY. 12/05/20  Yes Luking, Elayne Snare, MD  famotidine (PEPCID) 20 MG tablet TAKE 1 TABLET BY MOUTH EACH MORNING Patient taking differently: Take 20 mg by mouth every morning. 07/11/20  Yes Kathyrn Drown, MD  hydrALAZINE (APRESOLINE) 25 MG tablet Take 1 tablet (25 mg total) by mouth 2 (two) times daily. 10/16/20  Yes Kathyrn Drown, MD  hydrochlorothiazide (HYDRODIURIL) 25 MG tablet Take 1 tablet (25 mg total) by mouth daily. 10/16/20  Yes Kathyrn Drown, MD  hydrOXYzine (ATARAX/VISTARIL) 25 MG tablet TAKE 1 TABLET BY MOUTH 3 TIMES A DAY AS NEEDED FOR ITCHING. 01/26/21  Yes Luking, Elayne Snare, MD  losartan (COZAAR) 50 MG tablet TAKE (1) TABLET BY MOUTH ONCE DAILY. 10/16/20  Yes Kathyrn Drown, MD  metFORMIN (GLUCOPHAGE) 500 MG tablet Take one tablet '500mg'$  po in the morning and 1/2 tablet 250 mg po at supper 10/16/20  Yes Luking, Scott A, MD  Olopatadine HCl 0.2 % SOLN Place 1 drop into both eyes daily.   Yes [provider]  pantoprazole (PROTONIX) 20 MG tablet TAKE (1) TABLET BY MOUTH ONCE DAILY. 10/16/20  Yes Kathyrn Drown, MD  Vitamin D, Cholecalciferol, 10 MCG (400 UNIT) TABS TAKE 1 TABLET BY MOUTH ONCE DAILY. 05/12/20  Yes Luking, Elayne Snare, MD  QC HEMORRHOIDAL 0.25-14-74.9 % rectal ointment APPLY TO RECTUM 3 TIMES DAILY AS NEEDED FOR IRRITATION. Patient not taking: No sig reported 10/30/19   Kathyrn Drown, MD    Allergies    Hydrocortisone, Lisinopril, Phenergan [promethazine hcl], Statins, Trazodone and nefazodone, and Prednisone  Review of Systems   Review of Systems  Constitutional:  Positive for fever.  HENT:  Negative for ear pain and  sore throat.   Eyes:  Negative for visual disturbance.  Respiratory:  Positive for cough. Negative for shortness of breath.   Cardiovascular:  Negative for chest pain.  Gastrointestinal:  Positive for nausea and vomiting. Negative for abdominal pain, constipation and diarrhea.  Genitourinary:  Negative for dysuria and hematuria.  Musculoskeletal:  Negative for back pain.  Skin:  Negative for rash.  Neurological:  Negative for seizures  and syncope.  All other systems reviewed and are negative.  Physical Exam Updated Vital Signs BP (!) 128/49   Pulse 96   Temp 99.4 F (37.4 C) (Oral)   Resp 17   Ht '5\' 8"'$  (1.727 m)   Wt 84 kg   SpO2 94%   BMI 28.16 kg/m   Physical Exam Vitals and nursing note reviewed.  Constitutional:      General: She is not in acute distress.    Appearance: She is well-developed.  HENT:     Head: Normocephalic and atraumatic.     Mouth/Throat:     Mouth: Mucous membranes are dry.  Eyes:     Conjunctiva/sclera: Conjunctivae normal.  Cardiovascular:     Rate and Rhythm: Regular rhythm. Tachycardia present.     Heart sounds: Normal heart sounds. No murmur heard. Pulmonary:     Effort: Pulmonary effort is normal. No respiratory distress.     Breath sounds: Rales (bibasilar) present.  Abdominal:     General: Bowel sounds are normal.     Palpations: Abdomen is soft.     Tenderness: There is no abdominal tenderness. There is no guarding or rebound.  Musculoskeletal:     Cervical back: Neck supple.     Right lower leg: No edema.     Left lower leg: No edema.  Skin:    General: Skin is warm and dry.  Neurological:     Mental Status: She is alert.    ED Results / Procedures / Treatments   Labs (all labs ordered are listed, but only abnormal results are displayed) Labs Reviewed  RESP PANEL BY RT-PCR (FLU A&B, COVID) ARPGX2 - Abnormal; Notable for the following components:      Result Value   SARS Coronavirus 2 by RT PCR POSITIVE (*)    All other  components within normal limits  CBC WITH DIFFERENTIAL/PLATELET - Abnormal; Notable for the following components:   WBC 18.3 (*)    Neutro Abs 16.7 (*)    Lymphs Abs 0.5 (*)    Abs Immature Granulocytes 0.13 (*)    All other components within normal limits  COMPREHENSIVE METABOLIC PANEL - Abnormal; Notable for the following components:   Sodium 127 (*)    Chloride 91 (*)    Glucose, Bld 184 (*)    Total Bilirubin 1.4 (*)    GFR, Estimated 59 (*)    All other components within normal limits  CULTURE, BLOOD (ROUTINE X 2)  CULTURE, BLOOD (ROUTINE X 2)  LIPASE, BLOOD  LACTIC ACID, PLASMA  LACTIC ACID, PLASMA  PROTIME-INR  APTT  URINALYSIS, ROUTINE W REFLEX MICROSCOPIC    EKG EKG Interpretation  Date/Time:  Sunday February 25 2021 15:17:22 EDT Ventricular Rate:  104 PR Interval:  194 QRS Duration: 83 QT Interval:  362 QTC Calculation: 477 R Axis:   51 Text Interpretation: Fast sinus arrhythmia Anterior infarct, old Borderline ST depression, lateral leads No acute changes form prior 1/21 Confirmed by Aletta Edouard [phone]) on 02/25/2021 3:20:54 PM  Radiology CT Angio Chest PE W and/or Wo Contrast  Result Date: 02/25/2021 CLINICAL DATA:  Vomiting, fever, congestion, abdominal pain. Positive COVID 12 days ago. Hx of DM, HTN, Diverticulitis, hemicolectomy, hysterectomy, hernia repair. EXAM: CT ANGIOGRAPHY CHEST CT ABDOMEN AND PELVIS WITH CONTRAST TECHNIQUE: Multidetector CT imaging of the chest was performed using the standard protocol during bolus administration of intravenous contrast. Multiplanar CT image reconstructions and MIPs were obtained to evaluate the vascular anatomy. Multidetector CT imaging of the  abdomen and pelvis was performed using the standard protocol during bolus administration of intravenous contrast. CONTRAST:  49m OMNIPAQUE IOHEXOL 350 MG/ML SOLN COMPARISON:  CT abdomen pelvis 04/02/2020 FINDINGS: CTA CHEST FINDINGS Cardiovascular: Satisfactory opacification of  the pulmonary arteries to the segmental level. No evidence of pulmonary embolism. The main pulmonary artery is normal in caliber. Normal heart size. No significant pericardial effusion. The thoracic aorta is normal in caliber. Severe calcified and noncalcified atherosclerotic plaque of the thoracic aorta. At least 2 vessel coronary artery calcifications. Possible small hiatal hernia. Mediastinum/Nodes: No enlarged mediastinal, hilar, or axillary lymph nodes. Thyroid gland, trachea, and esophagus demonstrate no significant findings. Lungs/Pleura: Left lower lobe patchy airspace opacity with associated bronchial wall thickening and mucous plugging. Scattered pulmonary micronodules. No pulmonary mass. No pleural effusion. No pneumothorax. Diffuse bronchial wall thickening. Musculoskeletal: No chest wall abnormality. No suspicious lytic or blastic osseous lesions. No acute displaced fracture. Multilevel degenerative changes of the spine. Review of the MIP images confirms the above findings. CT ABDOMEN and PELVIS FINDINGS Hepatobiliary: Subcentimeter hypodensities too small to characterize. No focal liver abnormality. Multiple calcified gallstones are noted within the gallbladder lumen. No gallbladder wall thickening or pericholecystic fluid. No biliary dilatation. Pancreas: Diffusely atrophic. No focal lesion. Otherwise normal pancreatic contour. No surrounding inflammatory changes. No main pancreatic ductal dilatation. Spleen: Normal in size without focal abnormality. Adrenals/Urinary Tract: No adrenal nodule bilaterally. Bilateral kidneys enhance symmetrically. Bilateral renal cortical scarring. No hydronephrosis. No hydroureter. The urinary bladder is unremarkable. On delayed imaging, there is no urothelial wall thickening and there are no filling defects in the opacified portions of the bilateral collecting systems or ureters. Stomach/Bowel: Stomach is within normal limits. No evidence of bowel wall thickening or  dilatation. Scattered colonic diverticulosis. The appendix not definitely identified with no right lower quadrant inflammatory changes to suggest acute appendicitis. Vascular/Lymphatic: No abdominal aorta or iliac aneurysm. Severe atherosclerotic plaque of the aorta and its branches. No abdominal, pelvic, or inguinal lymphadenopathy. Reproductive: Status post hysterectomy. No adnexal masses. Other: No intraperitoneal free fluid. No intraperitoneal free gas. No organized fluid collection. Musculoskeletal: Status post ventral wall hernia repair with mesh. Diastasis rectus but no recurrent hernia. No suspicious lytic or blastic osseous lesions. No acute displaced fracture. Multilevel degenerative changes of the spine. Lipomatous lesion along the anterior aspect of the right scapula (4:21). Review of the MIP images confirms the above findings. IMPRESSION: 1. No pulmonary embolus. 2. Left lower lobe infection/inflammation with associated bronchial wall thickening and mucous plugging. 3. Cholelithiasis with no acute cholecystitis or choledocholithiasis. 4. Scattered colonic diverticulosis with no acute diverticulitis. 5.  Aortic Atherosclerosis (ICD10-I70.0). Electronically Signed   By: MIven FinnM.D.   On: 02/25/2021 18:43   CT ABDOMEN PELVIS W CONTRAST  Result Date: 02/25/2021 CLINICAL DATA:  Vomiting, fever, congestion, abdominal pain. Positive COVID 12 days ago. Hx of DM, HTN, Diverticulitis, hemicolectomy, hysterectomy, hernia repair. EXAM: CT ANGIOGRAPHY CHEST CT ABDOMEN AND PELVIS WITH CONTRAST TECHNIQUE: Multidetector CT imaging of the chest was performed using the standard protocol during bolus administration of intravenous contrast. Multiplanar CT image reconstructions and MIPs were obtained to evaluate the vascular anatomy. Multidetector CT imaging of the abdomen and pelvis was performed using the standard protocol during bolus administration of intravenous contrast. CONTRAST:  854mOMNIPAQUE IOHEXOL  350 MG/ML SOLN COMPARISON:  CT abdomen pelvis 04/02/2020 FINDINGS: CTA CHEST FINDINGS Cardiovascular: Satisfactory opacification of the pulmonary arteries to the segmental level. No evidence of pulmonary embolism. The main pulmonary artery is normal in  caliber. Normal heart size. No significant pericardial effusion. The thoracic aorta is normal in caliber. Severe calcified and noncalcified atherosclerotic plaque of the thoracic aorta. At least 2 vessel coronary artery calcifications. Possible small hiatal hernia. Mediastinum/Nodes: No enlarged mediastinal, hilar, or axillary lymph nodes. Thyroid gland, trachea, and esophagus demonstrate no significant findings. Lungs/Pleura: Left lower lobe patchy airspace opacity with associated bronchial wall thickening and mucous plugging. Scattered pulmonary micronodules. No pulmonary mass. No pleural effusion. No pneumothorax. Diffuse bronchial wall thickening. Musculoskeletal: No chest wall abnormality. No suspicious lytic or blastic osseous lesions. No acute displaced fracture. Multilevel degenerative changes of the spine. Review of the MIP images confirms the above findings. CT ABDOMEN and PELVIS FINDINGS Hepatobiliary: Subcentimeter hypodensities too small to characterize. No focal liver abnormality. Multiple calcified gallstones are noted within the gallbladder lumen. No gallbladder wall thickening or pericholecystic fluid. No biliary dilatation. Pancreas: Diffusely atrophic. No focal lesion. Otherwise normal pancreatic contour. No surrounding inflammatory changes. No main pancreatic ductal dilatation. Spleen: Normal in size without focal abnormality. Adrenals/Urinary Tract: No adrenal nodule bilaterally. Bilateral kidneys enhance symmetrically. Bilateral renal cortical scarring. No hydronephrosis. No hydroureter. The urinary bladder is unremarkable. On delayed imaging, there is no urothelial wall thickening and there are no filling defects in the opacified portions of the  bilateral collecting systems or ureters. Stomach/Bowel: Stomach is within normal limits. No evidence of bowel wall thickening or dilatation. Scattered colonic diverticulosis. The appendix not definitely identified with no right lower quadrant inflammatory changes to suggest acute appendicitis. Vascular/Lymphatic: No abdominal aorta or iliac aneurysm. Severe atherosclerotic plaque of the aorta and its branches. No abdominal, pelvic, or inguinal lymphadenopathy. Reproductive: Status post hysterectomy. No adnexal masses. Other: No intraperitoneal free fluid. No intraperitoneal free gas. No organized fluid collection. Musculoskeletal: Status post ventral wall hernia repair with mesh. Diastasis rectus but no recurrent hernia. No suspicious lytic or blastic osseous lesions. No acute displaced fracture. Multilevel degenerative changes of the spine. Lipomatous lesion along the anterior aspect of the right scapula (4:21). Review of the MIP images confirms the above findings. IMPRESSION: 1. No pulmonary embolus. 2. Left lower lobe infection/inflammation with associated bronchial wall thickening and mucous plugging. 3. Cholelithiasis with no acute cholecystitis or choledocholithiasis. 4. Scattered colonic diverticulosis with no acute diverticulitis. 5.  Aortic Atherosclerosis (ICD10-I70.0). Electronically Signed   By: Iven Finn M.D.   On: 02/25/2021 18:43   DG Chest Portable 1 View  Result Date: 02/25/2021 CLINICAL DATA:  Cough.  COVID positive 12 days ago.  Fever. EXAM: PORTABLE CHEST 1 VIEW COMPARISON:  Chest x-ray dated 07/09/2019. FINDINGS: Heart size and mediastinal contours are stable. Lungs are clear. No pleural effusion or pneumothorax is seen. Osseous structures about the chest are unremarkable. IMPRESSION: No active cardiopulmonary disease. No evidence of pneumonia or pulmonary edema. Electronically Signed   By: Franki Cabot M.D.   On: 02/25/2021 15:20    Procedures Procedures   Medications Ordered in  ED Medications  vancomycin (VANCOREADY) IVPB 750 mg/150 mL (has no administration in time range)  ceFEPIme (MAXIPIME) 2 g in sodium chloride 0.9 % 100 mL IVPB (has no administration in time range)  benzonatate (TESSALON) capsule 100 mg (has no administration in time range)  sodium chloride 0.9 % bolus 1,000 mL (0 mLs Intravenous Stopped 02/25/21 1635)  ondansetron (ZOFRAN) injection 4 mg (4 mg Intravenous Given 02/25/21 1448)  acetaminophen (TYLENOL) tablet 650 mg (650 mg Oral Given 02/25/21 1449)  ceFEPIme (MAXIPIME) 2 g in sodium chloride 0.9 % 100 mL IVPB (0  g Intravenous Stopped 02/25/21 1706)  metroNIDAZOLE (FLAGYL) IVPB 500 mg (0 mg Intravenous Stopped 02/25/21 1707)  vancomycin (VANCOCIN) IVPB 1000 mg/200 mL premix (0 mg Intravenous Stopped 02/25/21 1707)  albuterol (VENTOLIN HFA) 108 (90 Base) MCG/ACT inhaler 4 puff (4 puffs Inhalation Given 02/25/21 1657)  iohexol (OMNIPAQUE) 350 MG/ML injection 85 mL (85 mLs Intravenous Contrast Given 02/25/21 1738)    ED Course  I have reviewed the triage vital signs and the nursing notes.  Pertinent labs & imaging results that were available during my care of the patient were reviewed by me and considered in my medical decision making (see chart for details).    MDM Rules/Calculators/A&P                          87 y/o F presenting for eval of nv. Was dx with covid about 12 days ago but has not had symptoms until about 24 hours pta.   Reviewed/interpreted labs CBC - with leukocytosis at 18 with left shift, otherwise reassuring CMP - with hyponatremia and hypochloremia, normal cr and lfts Lipase - negative COVID - + Lactic acid negative Blood cultures obtained Coags wnl  EKG - Fast sinus arrhythmia Anterior infarct, old Borderline ST depression, lateral leads No acute changes form prior 1/21  Reviewed/interpreted imaging CXR - No active cardiopulmonary disease. No evidence of pneumonia or pulmonary edema CT chest/abd/pelvis - 1. No pulmonary  embolus. 2. Left lower lobe infection/inflammation with associated bronchial wall thickening and mucous plugging. 3. Cholelithiasis with no acute cholecystitis or choledocholithiasis. 4. Scattered colonic diverticulosis with no acute diverticulitis. 5.  Aortic Atherosclerosis    Pt here with nv, cough, weakness. Found to have covid with a superimposed likely bacterial pneumonia. She meets sepsis criteria. She was given abx. We will plan for admission   7:41 PM CONSULT with Dr. Nehemiah Settle who accepts patient for admission   Final Clinical Impression(s) / ED Diagnoses Final diagnoses:  COVID    Rx / DC Orders ED Discharge Orders     None        Rodney Booze, PA-C [id number]    Lianne Cure, DO [id number] [id number]

## 2021-02-25 NOTE — Progress Notes (Signed)
Pharmacy Antibiotic Note  [name] a 87 y.o. female admitted on 02/25/2021 with sepsis.  Pharmacy has been consulted for vancomycin and cefepime dosing.  Plan: Vancomycin '750mg'$  IV every 12 hours.  Goal trough 15-20 mcg/mL. Cefepime 2gm IV every 12 hours.  Medical History: Past Medical History:  Diagnosis Date   Cognitive dysfunction 03/04/2019   Patient scores 21 out of 30 on [place] cognitive assessment September 2020   Diabetes mellitus without complication ([place])    Diverticulitis    Frailty 03/04/2019   H/O bilateral breast reduction surgery    Hyperlipidemia    a. intolerant to statins.    Hypertension     Allergies:  Allergies  Allergen Reactions   Hydrocortisone Itching   Lisinopril     Other reaction(s): Lethargy (intolerance)   Phenergan [Promethazine Hcl] Other (See Comments)    hallucinations   Statins Other (See Comments)    Severe myalgias   Trazodone And Nefazodone Cough   Prednisone Rash    Filed Weights   02/25/21 1412  Weight: 84 kg (185 lb 3 oz)    CBC Latest Ref Rng & Units 02/25/2021 04/06/2020 04/04/2020  WBC 4.0 - 10.5 K/uL 18.3(H) 7.1 7.0  Hemoglobin 12.0 - 15.0 g/dL 12.7 12.3 11.9(L)  Hematocrit 36.0 - 46.0 % 38.2 38.8 37.2  Platelets 150 - 400 K/uL 248 317 308     Estimated Creatinine Clearance: 47.9 mL/min (by C-G formula based on SCr of 0.94 mg/dL).  Antibiotics Given (last 72 hours)     Date/Time Action Medication Dose Rate   02/25/21 1536 New Bag/Given   metroNIDAZOLE (FLAGYL) IVPB 500 mg 500 mg 100 mL/hr   02/25/21 1539 New Bag/Given   ceFEPIme (MAXIPIME) 2 g in sodium chloride 0.9 % 100 mL IVPB 2 g 200 mL/hr   02/25/21 1546 New Bag/Given   vancomycin (VANCOCIN) IVPB 1000 mg/200 mL premix 1,000 mg 200 mL/hr       Antimicrobials this admission:   Cefepime 02/25/2021 >>  vancomycin 02/25/2021 >>  Metronidazole 02/25/2021  x 1   Microbiology results: 02/25/2021 BCx: sent 02/25/2021 Resp Panel: sent   Thank you for allowing  pharmacy to be a part of this patient's care.  Thomasenia Sales, PharmD Clinical Pharmacist

## 2021-02-25 NOTE — ED Triage Notes (Signed)
Pt COVID + 12 days ago, today she is having symptoms, pt from [place].  101.2temp cough and congestions

## 2021-02-26 ENCOUNTER — Other Ambulatory Visit: Payer: Self-pay

## 2021-02-26 DIAGNOSIS — J9601 Acute respiratory failure with hypoxia: Secondary | ICD-10-CM

## 2021-02-26 LAB — URINALYSIS, ROUTINE W REFLEX MICROSCOPIC
Bilirubin Urine: NEGATIVE
Glucose, UA: 50 mg/dL — AB
Hgb urine dipstick: NEGATIVE
Ketones, ur: NEGATIVE mg/dL
Leukocytes,Ua: NEGATIVE
Nitrite: NEGATIVE
Protein, ur: NEGATIVE mg/dL
Specific Gravity, Urine: 1.02 (ref 1.005–1.030)
pH: 5 (ref 5.0–8.0)

## 2021-02-26 LAB — GLUCOSE, CAPILLARY
Glucose-Capillary: 136 mg/dL — ABNORMAL HIGH (ref 70–99)
Glucose-Capillary: 142 mg/dL — ABNORMAL HIGH (ref 70–99)
Glucose-Capillary: 154 mg/dL — ABNORMAL HIGH (ref 70–99)
Glucose-Capillary: 168 mg/dL — ABNORMAL HIGH (ref 70–99)
Glucose-Capillary: 183 mg/dL — ABNORMAL HIGH (ref 70–99)

## 2021-02-26 LAB — COMPREHENSIVE METABOLIC PANEL
ALT: 14 U/L (ref 0–44)
AST: 17 U/L (ref 15–41)
Albumin: 3.3 g/dL — ABNORMAL LOW (ref 3.5–5.0)
Alkaline Phosphatase: 75 U/L (ref 38–126)
Anion gap: 12 (ref 5–15)
BUN: 16 mg/dL (ref 8–23)
CO2: 20 mmol/L — ABNORMAL LOW (ref 22–32)
Calcium: 7.9 mg/dL — ABNORMAL LOW (ref 8.9–10.3)
Chloride: 99 mmol/L (ref 98–111)
Creatinine, Ser: 0.89 mg/dL (ref 0.44–1.00)
GFR, Estimated: >60 mL/min (ref >60)
Glucose, Bld: 229 mg/dL — ABNORMAL HIGH (ref 70–99)
Potassium: 3.7 mmol/L (ref 3.5–5.1)
Sodium: 131 mmol/L — ABNORMAL LOW (ref 135–145)
Total Bilirubin: 0.6 mg/dL (ref 0.3–1.2)
Total Protein: 6.4 g/dL — ABNORMAL LOW (ref 6.5–8.1)

## 2021-02-26 LAB — D-DIMER, QUANTITATIVE: D-Dimer, Quant: 0.76 ug/mL-FEU — ABNORMAL HIGH (ref 0.00–0.50)

## 2021-02-26 LAB — HEMOGLOBIN A1C
Hgb A1c MFr Bld: 6.9 % — ABNORMAL HIGH (ref 4.8–5.6)
Mean Plasma Glucose: 151.33 mg/dL

## 2021-02-26 LAB — CBC
HCT: 34.1 % — ABNORMAL LOW (ref 36.0–46.0)
Hemoglobin: 10.9 g/dL — ABNORMAL LOW (ref 12.0–15.0)
MCH: 29.1 pg (ref 26.0–34.0)
MCHC: 32 g/dL (ref 30.0–36.0)
MCV: 90.9 fL (ref 80.0–100.0)
Platelets: 148 10*3/uL — ABNORMAL LOW (ref 150–400)
RBC: 3.75 MIL/uL — ABNORMAL LOW (ref 3.87–5.11)
RDW: 13.8 % (ref 11.5–15.5)
WBC: 13.2 10*3/uL — ABNORMAL HIGH (ref 4.0–10.5)
nRBC: 0 % (ref 0.0–0.2)

## 2021-02-26 LAB — PROCALCITONIN: Procalcitonin: 0.34 ng/mL

## 2021-02-26 LAB — C-REACTIVE PROTEIN: CRP: 17.3 mg/dL — ABNORMAL HIGH (ref <1.0)

## 2021-02-26 LAB — STREP PNEUMONIAE URINARY ANTIGEN: Strep Pneumo Urinary Antigen: NEGATIVE

## 2021-02-26 MED ORDER — INSULIN DETEMIR 100 UNIT/ML ~~LOC~~ SOLN
5.0000 [IU] | Freq: Every day | SUBCUTANEOUS | Status: DC
  Administered 2021-02-26 – 2021-02-27 (×2): 5 [IU] via SUBCUTANEOUS
  Filled 2021-02-26 (×3): qty 0.05

## 2021-02-26 MED ORDER — PANTOPRAZOLE SODIUM 40 MG PO TBEC
40.0000 mg | DELAYED_RELEASE_TABLET | Freq: Two times a day (BID) | ORAL | Status: DC
  Administered 2021-02-26 – 2021-02-27 (×3): 40 mg via ORAL
  Filled 2021-02-26 (×2): qty 1

## 2021-02-26 NOTE — NC FL2 (Signed)
[place] LEVEL OF CARE SCREENING TOOL     IDENTIFICATION  Patient Name: [name] Birthdate: 01/23/1932 Sex: female Admission Date (Current Location): 02/25/2021  [facility] and [place] Number:  [employer] and Address:  [place] [address], [place]      Provider Number: [id number]  Attending Physician Name and Address:  Orson Eva, MD  Relative Name and Phone Number:  Tarry Kos (Spouse)   [phone]    Current Level of Care: Hospital Recommended Level of Care: [place] Prior Approval Number:    Date Approved/Denied:   PASRR Number:    Discharge Plan: Other (Comment) (ALF)    Current Diagnoses: Patient Active Problem List   Diagnosis Date Noted   Acute respiratory failure with hypoxia ([place]) 02/26/2021   CAP (community acquired pneumonia) 02/25/2021   COVID-19 virus infection 02/25/2021   Myalgia due to statin 02/15/2020   Cognitive dysfunction 03/04/2019   Generalized anxiety disorder 06/05/2018   Vomiting and diarrhea 03/08/2018   Dehydration 03/08/2018   Hyponatremia 03/08/2018   C. difficile diarrhea 03/03/2018   Acute lower UTI 03/03/2018   Hyperlipidemia associated with type 2 diabetes mellitus ([place]) 10/02/2016   Pedal edema 10/02/2016   Insomnia 10/02/2016   SBO (small bowel obstruction) ([place]) 07/02/2016   Dyspnea on exertion 02/24/2016   Other fatigue    E-coli UTI 06/18/2015   Leukocytosis 12/16/2014   Nausea and vomiting 12/15/2014   HLD (hyperlipidemia) 12/15/2014   Essential hypertension 12/15/2014   Controlled type 2 diabetes mellitus without complication, without long-term current use of insulin ([place]) 02/15/2011    Orientation RESPIRATION BLADDER Height & Weight     Self, Time, Situation, Place  Normal Continent Weight: 185 lb 3 oz (84 kg) Height:  '5\' 8"'$  (172.7 cm)  BEHAVIORAL SYMPTOMS/MOOD NEUROLOGICAL BOWEL NUTRITION STATUS      Continent Diet (Diet heart  healthy/carb modified Room service appropriate? Yes; Fluid consistency: Thin)  AMBULATORY STATUS COMMUNICATION OF NEEDS Skin   Limited Assist Verbally Normal                       Personal Care Assistance Level of Assistance  Bathing, Feeding, Dressing Bathing Assistance: Independent Feeding assistance: Independent Dressing Assistance: Limited assistance     Functional Limitations Info  Sight, Hearing, Speech Sight Info: Impaired Hearing Info: Adequate Speech Info: Adequate    SPECIAL CARE FACTORS FREQUENCY                       Contractures Contractures Info: Not present    Additional Factors Info  Code Status, Allergies, Insulin Sliding Scale Code Status Info: Full Allergies Info: Hydrocortisone  Lisinopril  Phenergan (promethazine Hcl)  Statins  Trazodone And Nefazodone  Prednisone   Insulin Sliding Scale Info: CBG < 70: Implement Hypoglycemia Standing Orders and refer to Hypoglycemia Standing Orders sidebar report   CBG 70 - 120: 0 units   CBG 121 - 150: 2 units   CBG 151 - 200: 3 units   CBG 201 - 250: 5 units   CBG 251 - 300: 8 units   CBG 301 - 350: 11 units   CBG 351 - 400: 15 units   CBG > 400 call MD and obtain STAT lab verification       Current Medications (02/26/2021):  This is the current hospital active medication list Current Facility-Administered Medications  Medication Dose Route Frequency Provider Last Rate Last Admin  0.9 %  sodium chloride infusion   Intravenous Continuous Truett Mainland, DO 75 mL/hr at 02/26/21 0035 New Bag at 02/26/21 0035   ALPRAZolam (XANAX) tablet 0.25 mg  0.25 mg Oral BID PRN Truett Mainland, DO       amLODipine (NORVASC) tablet 10 mg  10 mg Oral Daily Truett Mainland, DO   10 mg at 02/26/21 0949   azithromycin (ZITHROMAX) 500 mg in sodium chloride 0.9 % 250 mL IVPB  500 mg Intravenous Q24H Truett Mainland, DO 250 mL/hr at 02/26/21 0119 500 mg at 02/26/21 0119   cefTRIAXone (ROCEPHIN) 2 g in sodium chloride 0.9 % 100  mL IVPB  2 g Intravenous Q24H Truett Mainland, DO 200 mL/hr at 02/26/21 0043 2 g at 02/26/21 0043   dexamethasone (DECADRON) injection 6 mg  6 mg Intravenous Q24H Truett Mainland, DO   6 mg at 02/25/21 2138   dicyclomine (BENTYL) capsule 10 mg  10 mg Oral BID PRN Truett Mainland, DO   10 mg at 02/26/21 0359   enoxaparin (LOVENOX) injection 40 mg  40 mg Subcutaneous Q24H Truett Mainland, DO   40 mg at 02/26/21 0551   insulin aspart (novoLOG) injection 0-15 Units  0-15 Units Subcutaneous TID WC Truett Mainland, DO   2 Units at 02/26/21 1229   insulin aspart (novoLOG) injection 0-5 Units  0-5 Units Subcutaneous QHS Stinson, Jacob J, DO       insulin detemir (LEVEMIR) injection 5 Units  5 Units Subcutaneous Daily Tat, David, MD   5 Units at 02/26/21 1228   ondansetron (ZOFRAN) tablet 4 mg  4 mg Oral Q6H PRN Truett Mainland, DO       Or   ondansetron [facility]) injection 4 mg  4 mg Intravenous Q6H PRN Stinson, Jacob J, DO       pantoprazole (PROTONIX) EC tablet 40 mg  40 mg Oral BID Tat, Shanon Brow, MD   40 mg at 02/26/21 [id number]   remdesivir 100 mg in sodium chloride 0.9 % 100 mL IVPB  100 mg Intravenous Daily Truett Mainland, DO 200 mL/hr at 02/26/21 0949 100 mg at 02/26/21 [id number]     Discharge Medications: Please see discharge summary for a list of discharge medications.  Relevant Imaging Results:  Relevant Lab Results:   Additional Information Pt SSN [id number]  Natasha Bence, LCSW

## 2021-02-26 NOTE — Progress Notes (Addendum)
PROGRESS NOTE  [name] [id number] DOB: 18-Nov-1931 DOA: 02/25/2021 PCP: Kathyrn Drown, MD  Brief History:  87 year old female with a history of diabetes mellitus type 2, hypertension presenting with 1 to 2-day history of nausea, vomiting, loose stool, coughing, shortness of breath, chest congestion.  The patient was also having fevers and chills.  The patient resides at [facility].  Apparently, the patient tested positive for COVID-19 on 02/13/2021.  She had been feeling fine up until 02/23/2021 when she began having the above symptoms.  Because of progressive symptoms and generalized weakness, the patient presented for further evaluation.  She denied any headache, neck pain, hematemesis, hemoptysis, hematochezia, melena, chest pain, abdominal pain.  In the emergency department, the patient was febrile up to 103.1 F.  She was hemodynamically stable with oxygen saturation 95-98% on room air.  CTA chest was negative for PE but showed LLL opacity with bronchial wall thickening and mucous plugging.  CT abdomen pelvis showed calcified gallstones without cholecystitis.  There was a ventral hernia repair.  There was diverticulosis and atrophic pancreas.  Blood work showed a sodium 127, serum creatinine 0.94, potassium 3.7.  WBC 18.3, hemoglobin 12.7, platelets 248,000.  The patient was started on remdesivir and steroids.  Assessment/Plan: Acute respiratory failure with hypoxia due to COVID-19 pneumonia -Stable on room air -CRP 7.2>> 17.3 -Ferritin -D-dimer 0.90>> 0.76 -PCT 0.23>> 0.34 -Lactic acid peaked 1.3 -continue IV dexamethasone  Lobar pneumonia -Continue ceftriaxone and azithromycin  COVID-19 gastroenteritis -Start PPI -Continue IV fluids  Diarrhea -Check C. Difficile -Stool pathogen panel  CKD stage IIIa -Baseline creatinine 0.7-0.9  Hyponatremia -Secondary to volume depletion -Continue IV normal saline  Essential hypertension -Continue amlodipine and  hydralazine -Holding losartan hydralazine and HCTZ  Anxiety -Continue home dose alprazolam and Vistaril  Diabetes mellitus type 2, controlled -Holding metformin -NovoLog sliding scale -start levemir 5 units daily      Status is: Inpatient  Remains inpatient appropriate because:Inpatient level of care appropriate due to severity of illness  Dispo: The patient is from: ALF              Anticipated d/c is to: ALF              Patient currently is not medically stable to d/c.   Difficult to place patient No        Family Communication:  no Family at bedside  Consultants:  none  Code Status:  FULL  DVT Prophylaxis:   Lovenox   Procedures: As Listed in Progress Note Above  Antibiotics: Ceftriaxone 8/28>> Azithro 8/28>>    Subjective: Patient denies fevers, chills, headache, chest pain, dyspnea, nausea, vomiting, dabdominal pain, dysuria, hematuria, hematochezia, and melena. She complains of loose stool and dry cough  Objective: Vitals:   02/25/21 2000 02/25/21 2300 02/25/21 2356 02/26/21 0343  BP: (!) 133/59 (!) 133/51 139/65 (!) 132/52  Pulse: 79 72 68 89  Resp: '17 16 18 18  '$ Temp:   98.4 F (36.9 C) 98 F (36.7 C)  TempSrc:   Oral Oral  SpO2: 94% 95% 95% 98%  Weight:      Height:        Intake/Output Summary (Last 24 hours) at 02/26/2021 [id number] Last data filed at 02/26/2021 0307 Gross per 24 hour  Intake 2165.59 ml  Output --  Net 2165.59 ml   Weight change:  Exam:  General:  Pt is alert, follows commands appropriately, not in acute distress  HEENT: No icterus, No thrush, No neck mass, [place]/AT Cardiovascular: RRR, S1/S2, no rubs, no gallops Respiratory: bibasilar crackles. No wheeze Abdomen: Soft/+BS, non tender, non distended, no guarding Extremities: No edema, No lymphangitis, No petechiae, No rashes, no synovitis   Data Reviewed: I have personally reviewed following labs and imaging studies Basic Metabolic Panel: Recent Labs  Lab  02/25/21 1437 02/26/21 0613  NA 127* 131*  K 3.5 3.7  CL 91* 99  CO2 25 20*  GLUCOSE 184* 229*  BUN 13 16  CREATININE 0.94 0.89  CALCIUM 8.9 7.9*   Liver Function Tests: Recent Labs  Lab 02/25/21 1437 02/26/21 0613  AST 18 17  ALT 17 14  ALKPHOS 89 75  BILITOT 1.4* 0.6  PROT 7.6 6.4*  ALBUMIN 4.2 3.3*   Recent Labs  Lab 02/25/21 1437  LIPASE 18   No results for input(s): AMMONIA in the last 168 hours. Coagulation Profile: Recent Labs  Lab 02/25/21 1509  INR 1.0   CBC: Recent Labs  Lab 02/25/21 1437 02/26/21 0613  WBC 18.3* 13.2*  NEUTROABS 16.7*  --   HGB 12.7 10.9*  HCT 38.2 34.1*  MCV 87.2 90.9  PLT 248 148*   Cardiac Enzymes: No results for input(s): CKTOTAL, CKMB, CKMBINDEX, TROPONINI in the last 168 hours. BNP: Invalid input(s): POCBNP CBG: Recent Labs  Lab 02/26/21 0029 02/26/21 0741  GLUCAP 183* 168*   HbA1C: No results for input(s): HGBA1C in the last 72 hours. Urine analysis:    Component Value Date/Time   COLORURINE YELLOW 04/02/2020 2003   APPEARANCEUR CLOUDY (A) 04/02/2020 2003   LABSPEC 1.013 04/02/2020 2003   PHURINE 8.0 04/02/2020 2003   GLUCOSEU NEGATIVE 04/02/2020 2003   HGBUR NEGATIVE 04/02/2020 2003   [place] NEGATIVE 04/02/2020 2003   [place] NEGATIVE 04/02/2020 2003   PROTEINUR 30 (A) 04/02/2020 2003   UROBILINOGEN 0.2 12/15/2014 1956   NITRITE NEGATIVE 04/02/2020 2003   LEUKOCYTESUR LARGE (A) 04/02/2020 2003   Sepsis Labs: '@LABRCNTIP'$ (procalcitonin:4,lacticidven:4) ) Recent Results (from the past 240 hour(s))  Blood culture (routine x 2)     Status: None (Preliminary result)   Collection Time: 02/25/21  4:58 PM   Specimen: Vein; Blood  Result Value Ref Range Status   Specimen Description   Final    RIGHT ANTECUBITAL BOTTLES DRAWN AEROBIC AND ANAEROBIC   Special Requests   Final    Blood Culture adequate volume Performed at [facility], [address] [place],  [address]    Culture PENDING   Incomplete   Report Status PENDING  Incomplete  Resp Panel by RT-PCR (Flu A&B, Covid) Nasopharyngeal Swab     Status: Abnormal   Collection Time: 02/25/21  5:05 PM   Specimen: Nasopharyngeal Swab; Nasopharyngeal(NP) swabs in vial transport medium  Result Value Ref Range Status   SARS Coronavirus 2 by RT PCR POSITIVE (A) NEGATIVE Final    Comment: CRITICAL RESULT CALLED TO, READ BACK BY AND VERIFIED WITH: DAVIS,N [id number] 02/25/2021 COLEMAN,R (NOTE) SARS-CoV-2 target nucleic acids are DETECTED.  The SARS-CoV-2 RNA is generally detectable in upper respiratory specimens during the acute phase of infection. Positive results are indicative of the presence of the identified virus, but do not rule out bacterial infection or co-infection with other pathogens not detected by the test. Clinical correlation with patient history and other diagnostic information is necessary to determine patient infection status. The expected result is Negative.  Fact Sheet for Patients: EntrepreneurPulse.com.au  Fact Sheet for Healthcare Providers: IncredibleEmployment.be  This test is not  yet approved or cleared by the [place] and  has been authorized for detection and/or diagnosis of SARS-CoV-2 by FDA under an Emergency Use Authorization (EUA).  This EUA will remain in effect (meaning this te st can be used) for the duration of  the COVID-19 declaration under Section 564(b)(1) of the Act, 21 U.S.C. section 360bbb-3(b)(1), unless the authorization is terminated or revoked sooner.     Influenza A by PCR NEGATIVE NEGATIVE Final   Influenza B by PCR NEGATIVE NEGATIVE Final    Comment: (NOTE) The Xpert Xpress SARS-CoV-2/FLU/RSV plus assay is intended as an aid in the diagnosis of influenza from Nasopharyngeal swab specimens and should not be used as a sole basis for treatment. Nasal washings and aspirates are unacceptable for Xpert Xpress  SARS-CoV-2/FLU/RSV testing.  Fact Sheet for Patients: EntrepreneurPulse.com.au  Fact Sheet for Healthcare Providers: IncredibleEmployment.be  This test is not yet approved or cleared by the [place] FDA and has been authorized for detection and/or diagnosis of SARS-CoV-2 by FDA under an Emergency Use Authorization (EUA). This EUA will remain in effect (meaning this test can be used) for the duration of the COVID-19 declaration under Section 564(b)(1) of the Act, 21 U.S.C. section 360bbb-3(b)(1), unless the authorization is terminated or revoked.  Performed at [facility], [address] [place], [place] [address]      Scheduled Meds:  amLODipine  10 mg Oral Daily   dexamethasone (DECADRON) injection  6 mg Intravenous Q24H   enoxaparin (LOVENOX) injection  40 mg Subcutaneous Q24H   famotidine  20 mg Oral q morning   hydrALAZINE  25 mg Oral BID   insulin aspart  0-15 Units Subcutaneous TID WC   insulin aspart  0-5 Units Subcutaneous QHS   insulin detemir  10 Units Subcutaneous QHS   losartan  50 mg Oral Daily   pantoprazole  40 mg Oral Daily   Continuous Infusions:  sodium chloride 75 mL/hr at 02/26/21 0035   azithromycin 500 mg (02/26/21 0119)   cefTRIAXone (ROCEPHIN)  IV 2 g (02/26/21 0043)   remdesivir 100 mg in NS 100 mL      Procedures/Studies: CT Angio Chest PE W and/or Wo Contrast  Result Date: 02/25/2021 CLINICAL DATA:  Vomiting, fever, congestion, abdominal pain. Positive COVID 12 days ago. Hx of DM, HTN, Diverticulitis, hemicolectomy, hysterectomy, hernia repair. EXAM: CT ANGIOGRAPHY CHEST CT ABDOMEN AND PELVIS WITH CONTRAST TECHNIQUE: Multidetector CT imaging of the chest was performed using the standard protocol during bolus administration of intravenous contrast. Multiplanar CT image reconstructions and MIPs were obtained to evaluate the vascular anatomy. Multidetector CT imaging of the abdomen and pelvis was performed  using the standard protocol during bolus administration of intravenous contrast. CONTRAST:  71m OMNIPAQUE IOHEXOL 350 MG/ML SOLN COMPARISON:  CT abdomen pelvis 04/02/2020 FINDINGS: CTA CHEST FINDINGS Cardiovascular: Satisfactory opacification of the pulmonary arteries to the segmental level. No evidence of pulmonary embolism. The main pulmonary artery is normal in caliber. Normal heart size. No significant pericardial effusion. The thoracic aorta is normal in caliber. Severe calcified and noncalcified atherosclerotic plaque of the thoracic aorta. At least 2 vessel coronary artery calcifications. Possible small hiatal hernia. Mediastinum/Nodes: No enlarged mediastinal, hilar, or axillary lymph nodes. Thyroid gland, trachea, and esophagus demonstrate no significant findings. Lungs/Pleura: Left lower lobe patchy airspace opacity with associated bronchial wall thickening and mucous plugging. Scattered pulmonary micronodules. No pulmonary mass. No pleural effusion. No pneumothorax. Diffuse bronchial wall thickening. Musculoskeletal: No chest wall abnormality. No suspicious lytic or blastic  osseous lesions. No acute displaced fracture. Multilevel degenerative changes of the spine. Review of the MIP images confirms the above findings. CT ABDOMEN and PELVIS FINDINGS Hepatobiliary: Subcentimeter hypodensities too small to characterize. No focal liver abnormality. Multiple calcified gallstones are noted within the gallbladder lumen. No gallbladder wall thickening or pericholecystic fluid. No biliary dilatation. Pancreas: Diffusely atrophic. No focal lesion. Otherwise normal pancreatic contour. No surrounding inflammatory changes. No main pancreatic ductal dilatation. Spleen: Normal in size without focal abnormality. Adrenals/Urinary Tract: No adrenal nodule bilaterally. Bilateral kidneys enhance symmetrically. Bilateral renal cortical scarring. No hydronephrosis. No hydroureter. The urinary bladder is unremarkable. On  delayed imaging, there is no urothelial wall thickening and there are no filling defects in the opacified portions of the bilateral collecting systems or ureters. Stomach/Bowel: Stomach is within normal limits. No evidence of bowel wall thickening or dilatation. Scattered colonic diverticulosis. The appendix not definitely identified with no right lower quadrant inflammatory changes to suggest acute appendicitis. Vascular/Lymphatic: No abdominal aorta or iliac aneurysm. Severe atherosclerotic plaque of the aorta and its branches. No abdominal, pelvic, or inguinal lymphadenopathy. Reproductive: Status post hysterectomy. No adnexal masses. Other: No intraperitoneal free fluid. No intraperitoneal free gas. No organized fluid collection. Musculoskeletal: Status post ventral wall hernia repair with mesh. Diastasis rectus but no recurrent hernia. No suspicious lytic or blastic osseous lesions. No acute displaced fracture. Multilevel degenerative changes of the spine. Lipomatous lesion along the anterior aspect of the right scapula (4:21). Review of the MIP images confirms the above findings. IMPRESSION: 1. No pulmonary embolus. 2. Left lower lobe infection/inflammation with associated bronchial wall thickening and mucous plugging. 3. Cholelithiasis with no acute cholecystitis or choledocholithiasis. 4. Scattered colonic diverticulosis with no acute diverticulitis. 5.  Aortic Atherosclerosis (ICD10-I70.0). Electronically Signed   By: Iven Finn M.D.   On: 02/25/2021 18:43   CT ABDOMEN PELVIS W CONTRAST  Result Date: 02/25/2021 CLINICAL DATA:  Vomiting, fever, congestion, abdominal pain. Positive COVID 12 days ago. Hx of DM, HTN, Diverticulitis, hemicolectomy, hysterectomy, hernia repair. EXAM: CT ANGIOGRAPHY CHEST CT ABDOMEN AND PELVIS WITH CONTRAST TECHNIQUE: Multidetector CT imaging of the chest was performed using the standard protocol during bolus administration of intravenous contrast. Multiplanar CT image  reconstructions and MIPs were obtained to evaluate the vascular anatomy. Multidetector CT imaging of the abdomen and pelvis was performed using the standard protocol during bolus administration of intravenous contrast. CONTRAST:  33m OMNIPAQUE IOHEXOL 350 MG/ML SOLN COMPARISON:  CT abdomen pelvis 04/02/2020 FINDINGS: CTA CHEST FINDINGS Cardiovascular: Satisfactory opacification of the pulmonary arteries to the segmental level. No evidence of pulmonary embolism. The main pulmonary artery is normal in caliber. Normal heart size. No significant pericardial effusion. The thoracic aorta is normal in caliber. Severe calcified and noncalcified atherosclerotic plaque of the thoracic aorta. At least 2 vessel coronary artery calcifications. Possible small hiatal hernia. Mediastinum/Nodes: No enlarged mediastinal, hilar, or axillary lymph nodes. Thyroid gland, trachea, and esophagus demonstrate no significant findings. Lungs/Pleura: Left lower lobe patchy airspace opacity with associated bronchial wall thickening and mucous plugging. Scattered pulmonary micronodules. No pulmonary mass. No pleural effusion. No pneumothorax. Diffuse bronchial wall thickening. Musculoskeletal: No chest wall abnormality. No suspicious lytic or blastic osseous lesions. No acute displaced fracture. Multilevel degenerative changes of the spine. Review of the MIP images confirms the above findings. CT ABDOMEN and PELVIS FINDINGS Hepatobiliary: Subcentimeter hypodensities too small to characterize. No focal liver abnormality. Multiple calcified gallstones are noted within the gallbladder lumen. No gallbladder wall thickening or pericholecystic fluid. No biliary dilatation. Pancreas:  Diffusely atrophic. No focal lesion. Otherwise normal pancreatic contour. No surrounding inflammatory changes. No main pancreatic ductal dilatation. Spleen: Normal in size without focal abnormality. Adrenals/Urinary Tract: No adrenal nodule bilaterally. Bilateral kidneys  enhance symmetrically. Bilateral renal cortical scarring. No hydronephrosis. No hydroureter. The urinary bladder is unremarkable. On delayed imaging, there is no urothelial wall thickening and there are no filling defects in the opacified portions of the bilateral collecting systems or ureters. Stomach/Bowel: Stomach is within normal limits. No evidence of bowel wall thickening or dilatation. Scattered colonic diverticulosis. The appendix not definitely identified with no right lower quadrant inflammatory changes to suggest acute appendicitis. Vascular/Lymphatic: No abdominal aorta or iliac aneurysm. Severe atherosclerotic plaque of the aorta and its branches. No abdominal, pelvic, or inguinal lymphadenopathy. Reproductive: Status post hysterectomy. No adnexal masses. Other: No intraperitoneal free fluid. No intraperitoneal free gas. No organized fluid collection. Musculoskeletal: Status post ventral wall hernia repair with mesh. Diastasis rectus but no recurrent hernia. No suspicious lytic or blastic osseous lesions. No acute displaced fracture. Multilevel degenerative changes of the spine. Lipomatous lesion along the anterior aspect of the right scapula (4:21). Review of the MIP images confirms the above findings. IMPRESSION: 1. No pulmonary embolus. 2. Left lower lobe infection/inflammation with associated bronchial wall thickening and mucous plugging. 3. Cholelithiasis with no acute cholecystitis or choledocholithiasis. 4. Scattered colonic diverticulosis with no acute diverticulitis. 5.  Aortic Atherosclerosis (ICD10-I70.0). Electronically Signed   By: Iven Finn M.D.   On: 02/25/2021 18:43   DG Chest Portable 1 View  Result Date: 02/25/2021 CLINICAL DATA:  Cough.  COVID positive 12 days ago.  Fever. EXAM: PORTABLE CHEST 1 VIEW COMPARISON:  Chest x-ray dated 07/09/2019. FINDINGS: Heart size and mediastinal contours are stable. Lungs are clear. No pleural effusion or pneumothorax is seen. Osseous  structures about the chest are unremarkable. IMPRESSION: No active cardiopulmonary disease. No evidence of pneumonia or pulmonary edema. Electronically Signed   By: Franki Cabot M.D.   On: 02/25/2021 15:20    Orson Eva, DO  [facility]  If 7PM-7AM, please contact night-coverage [email or web address] Password [email or web address] 02/26/2021, 8:32 AM   LOS: 1 day

## 2021-02-27 ENCOUNTER — Inpatient Hospital Stay (HOSPITAL_COMMUNITY): Payer: Medicare Other

## 2021-02-27 ENCOUNTER — Telehealth: Payer: Medicare Other | Admitting: Family Medicine

## 2021-02-27 DIAGNOSIS — J1282 Pneumonia due to coronavirus disease 2019: Secondary | ICD-10-CM

## 2021-02-27 LAB — D-DIMER, QUANTITATIVE: D-Dimer, Quant: 0.73 ug/mL-FEU — ABNORMAL HIGH (ref 0.00–0.50)

## 2021-02-27 LAB — COMPREHENSIVE METABOLIC PANEL
ALT: 15 U/L (ref 0–44)
AST: 20 U/L (ref 15–41)
Albumin: 3.5 g/dL (ref 3.5–5.0)
Alkaline Phosphatase: 69 U/L (ref 38–126)
Anion gap: 9 (ref 5–15)
BUN: 18 mg/dL (ref 8–23)
CO2: 24 mmol/L (ref 22–32)
Calcium: 8.6 mg/dL — ABNORMAL LOW (ref 8.9–10.3)
Chloride: 103 mmol/L (ref 98–111)
Creatinine, Ser: 0.74 mg/dL (ref 0.44–1.00)
GFR, Estimated: >60 mL/min (ref >60)
Glucose, Bld: 182 mg/dL — ABNORMAL HIGH (ref 70–99)
Potassium: 3.7 mmol/L (ref 3.5–5.1)
Sodium: 136 mmol/L (ref 135–145)
Total Bilirubin: 0.3 mg/dL (ref 0.3–1.2)
Total Protein: 6.4 g/dL — ABNORMAL LOW (ref 6.5–8.1)

## 2021-02-27 LAB — CBC
HCT: 36.2 % (ref 36.0–46.0)
Hemoglobin: 11.2 g/dL — ABNORMAL LOW (ref 12.0–15.0)
MCH: 28.5 pg (ref 26.0–34.0)
MCHC: 30.9 g/dL (ref 30.0–36.0)
MCV: 92.1 fL (ref 80.0–100.0)
Platelets: 206 10*3/uL (ref 150–400)
RBC: 3.93 MIL/uL (ref 3.87–5.11)
RDW: 13.8 % (ref 11.5–15.5)
WBC: 10.2 10*3/uL (ref 4.0–10.5)
nRBC: 0 % (ref 0.0–0.2)

## 2021-02-27 LAB — C-REACTIVE PROTEIN: CRP: 10.9 mg/dL — ABNORMAL HIGH (ref <1.0)

## 2021-02-27 LAB — LEGIONELLA PNEUMOPHILA SEROGP 1 UR AG: L. pneumophila Serogp 1 Ur Ag: NEGATIVE

## 2021-02-27 LAB — GLUCOSE, CAPILLARY
Glucose-Capillary: 155 mg/dL — ABNORMAL HIGH (ref 70–99)
Glucose-Capillary: 154 mg/dL — ABNORMAL HIGH (ref 70–99)

## 2021-02-27 LAB — PROCALCITONIN: Procalcitonin: 0.16 ng/mL

## 2021-02-27 MED ORDER — AZITHROMYCIN 250 MG PO TABS
500.0000 mg | ORAL_TABLET | Freq: Every day | ORAL | Status: DC
  Administered 2021-02-27: 500 mg via ORAL
  Filled 2021-02-27: qty 2

## 2021-02-27 MED ORDER — CEFDINIR 300 MG PO CAPS
300.0000 mg | ORAL_CAPSULE | Freq: Two times a day (BID) | ORAL | Status: DC
  Administered 2021-02-27: 300 mg via ORAL
  Filled 2021-02-27: qty 1

## 2021-02-27 MED ORDER — DEXAMETHASONE 4 MG PO TABS
6.0000 mg | ORAL_TABLET | Freq: Every day | ORAL | Status: DC
  Administered 2021-02-27: 6 mg via ORAL
  Filled 2021-02-27: qty 2

## 2021-02-27 MED ORDER — DEXAMETHASONE 6 MG PO TABS
6.0000 mg | ORAL_TABLET | Freq: Every day | ORAL | 0 refills | Status: DC
Start: 2021-02-27 — End: 2021-03-23

## 2021-02-27 MED ORDER — AZITHROMYCIN 500 MG PO TABS: 500.0000 mg | ORAL_TABLET | Freq: Every day | ORAL | 0 refills | Status: DC

## 2021-02-27 MED ORDER — CEFDINIR 300 MG PO CAPS: 300.0000 mg | ORAL_CAPSULE | Freq: Two times a day (BID) | ORAL | 0 refills | Status: DC

## 2021-02-27 NOTE — Clinical Social Work Note (Signed)
Tammy at [facility] notified of d/c and will send facility transportation to pick patient up. Son, [name], notified of d/c.   Capitola Ladson, Clydene Pugh, LCSW

## 2021-02-27 NOTE — Care Management Important Message (Signed)
Important Message  Patient Details  Name: [name] MRN: [id number] Date of Birth: [birth year]   Medicare Important Message Given:  Yes - Important Message mailed due to current National Emergency     Tommy Medal 02/27/2021, 1:16 PM

## 2021-02-27 NOTE — Telephone Encounter (Signed)
Nurse's-patient recently discharged from the hospital.   Please call patient, let them know that we are aware that they were discharged from the hospital.  We need for patient to do standard hospital follow up visit.  Please schedule them to follow-up with [place] within the next 7 to 14 days. Advised the patient to bring all of their medications with him to the visit.   Review meds with patient does patient understand meds?  Is patient having any immediate needs or questions?  Nurses Please do transitional care management phone call on Wednesday Please set her up for next Tuesday or Wednesday with me as a hospital follow-up side door entrance

## 2021-02-27 NOTE — NC FL2 (Addendum)
[place] LEVEL OF CARE SCREENING TOOL     IDENTIFICATION  Patient Name: [name] Birthdate: [birth year] Sex: female Admission Date (Current Location): 02/25/2021  [facility] and [place] Number:  [employer] and Address:  [facility] [address], [place]      Provider Number: [id number]  Attending Physician Name and Address:  Orson Eva, MD  Relative Name and Phone Number:  Tarry Kos (Spouse)   [phone]    Current Level of Care: Hospital Recommended Level of Care: Assisted Living Facility Prior Approval Number:    Date Approved/Denied:   PASRR Number:    Discharge Plan: Other (Comment) (ALF)    Current Diagnoses: Patient Active Problem List   Diagnosis Date Noted   Acute respiratory failure with hypoxia ([place]) 02/26/2021   CAP (community acquired pneumonia) 02/25/2021   COVID-19 virus infection 02/25/2021   Myalgia due to statin 02/15/2020   Cognitive dysfunction 03/04/2019   Generalized anxiety disorder 06/05/2018   Vomiting and diarrhea 03/08/2018   Dehydration 03/08/2018   Hyponatremia 03/08/2018   C. difficile diarrhea 03/03/2018   Acute lower UTI 03/03/2018   Hyperlipidemia associated with type 2 diabetes mellitus ([place]) 10/02/2016   Pedal edema 10/02/2016   Insomnia 10/02/2016   SBO (small bowel obstruction) ([place]) 07/02/2016   Dyspnea on exertion 02/24/2016   Other fatigue    E-coli UTI 06/18/2015   Leukocytosis 12/16/2014   Nausea and vomiting 12/15/2014   HLD (hyperlipidemia) 12/15/2014   Essential hypertension 12/15/2014   Controlled type 2 diabetes mellitus without complication, without long-term current use of insulin ([place]) 02/15/2011    Orientation RESPIRATION BLADDER Height & Weight     Self, Time, Situation, Place  Normal Continent Weight: 185 lb 3 oz (84 kg) Height:  '5\' 8"'$  (172.7 cm)  BEHAVIORAL SYMPTOMS/MOOD NEUROLOGICAL BOWEL NUTRITION STATUS      Continent Diet (heart healthy  which facility says equates to their regular diet.)  AMBULATORY STATUS COMMUNICATION OF NEEDS Skin   Limited Assist Verbally Normal                       Personal Care Assistance Level of Assistance  Bathing, Feeding, Dressing Bathing Assistance: Independent Feeding assistance: Independent Dressing Assistance: Limited assistance     Functional Limitations Info  Sight, Hearing, Speech Sight Info: Impaired Hearing Info: Adequate Speech Info: Adequate    SPECIAL CARE FACTORS FREQUENCY                       Contractures Contractures Info: Not present    Additional Factors Info  Code Status, Allergies, Insulin Sliding Scale Code Status Info: Full Allergies Info: Hydrocortisone  Lisinopril  Phenergan (promethazine Hcl)  Statins  Trazodone And Nefazodone  Prednisone          Current Medications (02/27/2021):  This is the current hospital active medication list Current Facility-Administered Medications  Medication Dose Route Frequency Provider Last Rate Last Admin   0.9 %  sodium chloride infusion   Intravenous Continuous Truett Mainland, DO 75 mL/hr at 02/26/21 1758 New Bag at 02/26/21 1758   ALPRAZolam Duanne Moron) tablet 0.25 mg  0.25 mg Oral BID PRN Truett Mainland, DO   0.25 mg at 02/26/21 2208   amLODipine (NORVASC) tablet 10 mg  10 mg Oral Daily Truett Mainland, DO   10 mg at 02/27/21 1011   azithromycin (ZITHROMAX) tablet 500 mg  500 mg Oral Daily Tat, Shanon Brow,  MD   500 mg at 02/27/21 1256   cefdinir (OMNICEF) capsule 300 mg  300 mg Oral Therisa Doyne, MD   300 mg at 02/27/21 1255   dexamethasone (DECADRON) tablet 6 mg  6 mg Oral Daily Tat, David, MD   6 mg at 02/27/21 1256   dicyclomine (BENTYL) capsule 10 mg  10 mg Oral BID PRN Truett Mainland, DO   10 mg at 02/26/21 0359   enoxaparin (LOVENOX) injection 40 mg  40 mg Subcutaneous Q24H Truett Mainland, DO   40 mg at 02/27/21 [id number]   insulin aspart (novoLOG) injection 0-15 Units  0-15 Units Subcutaneous TID WC  Truett Mainland, DO   3 Units at 02/27/21 1257   insulin aspart (novoLOG) injection 0-5 Units  0-5 Units Subcutaneous QHS Stinson, Jacob J, DO       insulin detemir (LEVEMIR) injection 5 Units  5 Units Subcutaneous Daily Tat, David, MD   5 Units at 02/27/21 1011   ondansetron (ZOFRAN) tablet 4 mg  4 mg Oral Q6H PRN Truett Mainland, DO       Or   ondansetron [facility]) injection 4 mg  4 mg Intravenous Q6H PRN Stinson, Jacob J, DO       pantoprazole (PROTONIX) EC tablet 40 mg  40 mg Oral BID Tat, Shanon Brow, MD   40 mg at 02/27/21 1011   remdesivir 100 mg in sodium chloride 0.9 % 100 mL IVPB  100 mg Intravenous Daily Truett Mainland, DO 200 mL/hr at 02/27/21 1014 100 mg at 02/27/21 1014     Discharge Medications: TAKE these medications     Acetaminophen Extra Strength 500 MG tablet Generic drug: acetaminophen TAKE (1) TABLET BY MOUTH EVERY (6) HOURS AS NEEDED FOR PAIN.**DO NOT EXCEED 8 TABLETS IN 24 HOURS** What changed: See the new instructions.    ALPRAZolam 0.25 MG tablet Commonly known as: XANAX TAKE 1 TABLET BY MOUTH TWICE A DAY.    amLODipine 10 MG tablet Commonly known as: NORVASC Take 1 tablet (10 mg total) by mouth daily.    azithromycin 500 MG tablet Commonly known as: ZITHROMAX Take 1 tablet (500 mg total) by mouth daily. X 4 days    beta carotene w/minerals tablet TAKE 1 TABLET BY MOUTH AT BEDTIME.    cefdinir 300 MG capsule Commonly known as: OMNICEF Take 1 capsule (300 mg total) by mouth every 12 (twelve) hours. X 4 days    dexamethasone 6 MG tablet Commonly known as: DECADRON Take 1 tablet (6 mg total) by mouth daily. X 7 days    dicyclomine 10 MG capsule Commonly known as: Bentyl 1 bid prn abd cramps and loose stool What changed:  how much to take how to take this when to take this reasons to take this additional instructions    famotidine 20 MG tablet Commonly known as: PEPCID TAKE 1 TABLET BY MOUTH EACH MORNING What changed: See the new  instructions.    hydrALAZINE 25 MG tablet Commonly known as: APRESOLINE Take 1 tablet (25 mg total) by mouth 2 (two) times daily.    hydrOXYzine 25 MG tablet Commonly known as: ATARAX/VISTARIL TAKE 1 TABLET BY MOUTH 3 TIMES A DAY AS NEEDED FOR ITCHING.    losartan 50 MG tablet Commonly known as: COZAAR TAKE (1) TABLET BY MOUTH ONCE DAILY.    metFORMIN 500 MG tablet Commonly known as: GLUCOPHAGE Take one tablet '500mg'$  po in the morning and 1/2 tablet 250 mg po at supper  Olopatadine HCl 0.2 % Soln Place 1 drop into both eyes daily.    pantoprazole 20 MG tablet Commonly known as: PROTONIX TAKE (1) TABLET BY MOUTH ONCE DAILY.    Theratears 0.25 % Soln Generic drug: Carboxymethylcellulose Sodium Apply 1 drop to eye 3 (three) times daily as needed (dry eyes).    Vitamin B-12 2500 MCG Subl TAKE 1/2 TABLET BY MOUTH AT BEDTIME.    Vitamin D (Cholecalciferol) 10 MCG (400 UNIT) Tabs TAKE 1 TABLET BY MOUTH ONCE DAILY.      Relevant Imaging Results:  Relevant Lab Results:   Additional Information Pt SSN [id number]  Ihor Gully, LCSW

## 2021-02-27 NOTE — Discharge Summary (Signed)
Physician Discharge Summary  [name] [id number] DOB: May 27, 1932 DOA: 02/25/2021  PCP: Kathyrn Drown, MD  Admit date: 02/25/2021 Discharge date: 02/27/2021  Admitted From: ALF Disposition:  ALF  Recommendations for Outpatient Follow-up:  Follow up with PCP in 1-2 weeks Please obtain BMP/CBC in one week    Discharge Condition: Stable CODE STATUS: Diet recommendation: Heart Healthy    Brief/Interim Summary: 87 year old female with a history of diabetes mellitus type 2, hypertension presenting with 1 to 2-day history of nausea, vomiting, loose stool, coughing, shortness of breath, chest congestion.  The patient was also having fevers and chills.  The patient resides at [facility].  Apparently, the patient tested positive for COVID-19 on 02/13/2021.  She had been feeling fine up until 02/23/2021 when she began having the above symptoms.  Because of progressive symptoms and generalized weakness, the patient presented for further evaluation.  She denied any headache, neck pain, hematemesis, hemoptysis, hematochezia, melena, chest pain, abdominal pain.  In the emergency department, the patient was febrile up to 103.1 F.  She was hemodynamically stable with oxygen saturation 95-98% on room air.  CTA chest was negative for PE but showed LLL opacity with bronchial wall thickening and mucous plugging.  CT abdomen pelvis showed calcified gallstones without cholecystitis.  There was a ventral hernia repair.  There was diverticulosis and atrophic pancreas.  Blood work showed a sodium 127, serum creatinine 0.94, potassium 3.7.  WBC 18.3, hemoglobin 12.7, platelets 248,000.  The patient was started on remdesivir and steroids with good clinical improvement.  Discharge Diagnoses:   Acute respiratory failure with hypoxia due to COVID-19 pneumonia -Stable on room air -CRP 7.2>> 17.3>>10.9 -D-dimer 0.90>> 0.76>>0.73 -PCT 0.23>> 0.34>>0.73 -Lactic acid peaked 1.3 -continue IV  dexamethasone>>d/c to ALF with 7 more days dexamethasone po -CTA chest neg PE -8/30 CXR--no new findings, no edema, no new infiltrates   Lobar pneumonia -Continue ceftriaxone and azithromycin -d/c home with cefdinir and azithro x 4 more days -WBC 18.3>>10.2   COVID-19 gastroenteritis -Start PPI -Continue IV fluids -tolerating carb modified diet   Diarrhea -Check C. Difficile -Stool pathogen panel -resolved--no BM to collect   CKD stage IIIa -Baseline creatinine 0.7-0.9 -serum creatinine 0.74 on day of dc   Hyponatremia -Secondary to volume depletion -Continue IV normal saline>>136 on day of d/c   Essential hypertension -Continue amlodipine and hydralazine -Holding losartan hydralazine and HCTZ -will not restart HCTZ   Anxiety -Continue home dose alprazolam and Vistaril   Diabetes mellitus type 2, controlled -Holding metformin>>restart after d/c -NovoLog sliding scale -8/28 A1C--6.9 -start levemir 5 units daily  Discharge Instructions   Allergies as of 02/27/2021       Reactions   Hydrocortisone Itching   Lisinopril    Other reaction(s): Lethargy (intolerance)   Phenergan [promethazine Hcl] Other (See Comments)   hallucinations   Statins Other (See Comments)   Severe myalgias   Trazodone And Nefazodone Cough   Prednisone Rash        Medication List     STOP taking these medications    DropSafe Safety Pen Needles 31G X 6 MM Misc Generic drug: Insulin Pen Needle   EasyMax Test test strip Generic drug: glucose blood   hydrochlorothiazide 25 MG tablet Commonly known as: HYDRODIURIL   QC Hemorrhoidal 0.25-14-74.9 % rectal ointment Generic drug: phenylephrine-shark liver oil-mineral oil-petrolatum       TAKE these medications    Acetaminophen Extra Strength 500 MG tablet Generic drug: acetaminophen TAKE (1) TABLET BY MOUTH EVERY (  6) HOURS AS NEEDED FOR PAIN.**DO NOT EXCEED 8 TABLETS IN 24 HOURS** What changed: See the new instructions.    ALPRAZolam 0.25 MG tablet Commonly known as: XANAX TAKE 1 TABLET BY MOUTH TWICE A DAY.   amLODipine 10 MG tablet Commonly known as: NORVASC Take 1 tablet (10 mg total) by mouth daily.   azithromycin 500 MG tablet Commonly known as: ZITHROMAX Take 1 tablet (500 mg total) by mouth daily. X 4 days   beta carotene w/minerals tablet TAKE 1 TABLET BY MOUTH AT BEDTIME.   cefdinir 300 MG capsule Commonly known as: OMNICEF Take 1 capsule (300 mg total) by mouth every 12 (twelve) hours. X 4 days   dexamethasone 6 MG tablet Commonly known as: DECADRON Take 1 tablet (6 mg total) by mouth daily. X 7 days   dicyclomine 10 MG capsule Commonly known as: Bentyl 1 bid prn abd cramps and loose stool What changed:  how much to take how to take this when to take this reasons to take this additional instructions   famotidine 20 MG tablet Commonly known as: PEPCID TAKE 1 TABLET BY MOUTH EACH MORNING What changed: See the new instructions.   hydrALAZINE 25 MG tablet Commonly known as: APRESOLINE Take 1 tablet (25 mg total) by mouth 2 (two) times daily.   hydrOXYzine 25 MG tablet Commonly known as: ATARAX/VISTARIL TAKE 1 TABLET BY MOUTH 3 TIMES A DAY AS NEEDED FOR ITCHING.   losartan 50 MG tablet Commonly known as: COZAAR TAKE (1) TABLET BY MOUTH ONCE DAILY.   metFORMIN 500 MG tablet Commonly known as: GLUCOPHAGE Take one tablet '500mg'$  po in the morning and 1/2 tablet 250 mg po at supper   Olopatadine HCl 0.2 % Soln Place 1 drop into both eyes daily.   pantoprazole 20 MG tablet Commonly known as: PROTONIX TAKE (1) TABLET BY MOUTH ONCE DAILY.   Theratears 0.25 % Soln Generic drug: Carboxymethylcellulose Sodium Apply 1 drop to eye 3 (three) times daily as needed (dry eyes).   Vitamin B-12 2500 MCG Subl TAKE 1/2 TABLET BY MOUTH AT BEDTIME.   Vitamin D (Cholecalciferol) 10 MCG (400 UNIT) Tabs TAKE 1 TABLET BY MOUTH ONCE DAILY.        Allergies  Allergen Reactions    Hydrocortisone Itching   Lisinopril     Other reaction(s): Lethargy (intolerance)   Phenergan [Promethazine Hcl] Other (See Comments)    hallucinations   Statins Other (See Comments)    Severe myalgias   Trazodone And Nefazodone Cough   Prednisone Rash    Consultations: none   Procedures/Studies: CT Angio Chest PE W and/or Wo Contrast  Result Date: 02/25/2021 CLINICAL DATA:  Vomiting, fever, congestion, abdominal pain. Positive COVID 12 days ago. Hx of DM, HTN, Diverticulitis, hemicolectomy, hysterectomy, hernia repair. EXAM: CT ANGIOGRAPHY CHEST CT ABDOMEN AND PELVIS WITH CONTRAST TECHNIQUE: Multidetector CT imaging of the chest was performed using the standard protocol during bolus administration of intravenous contrast. Multiplanar CT image reconstructions and MIPs were obtained to evaluate the vascular anatomy. Multidetector CT imaging of the abdomen and pelvis was performed using the standard protocol during bolus administration of intravenous contrast. CONTRAST:  22m OMNIPAQUE IOHEXOL 350 MG/ML SOLN COMPARISON:  CT abdomen pelvis 04/02/2020 FINDINGS: CTA CHEST FINDINGS Cardiovascular: Satisfactory opacification of the pulmonary arteries to the segmental level. No evidence of pulmonary embolism. The main pulmonary artery is normal in caliber. Normal heart size. No significant pericardial effusion. The thoracic aorta is normal in caliber. Severe calcified and noncalcified atherosclerotic plaque  of the thoracic aorta. At least 2 vessel coronary artery calcifications. Possible small hiatal hernia. Mediastinum/Nodes: No enlarged mediastinal, hilar, or axillary lymph nodes. Thyroid gland, trachea, and esophagus demonstrate no significant findings. Lungs/Pleura: Left lower lobe patchy airspace opacity with associated bronchial wall thickening and mucous plugging. Scattered pulmonary micronodules. No pulmonary mass. No pleural effusion. No pneumothorax. Diffuse bronchial wall thickening.  Musculoskeletal: No chest wall abnormality. No suspicious lytic or blastic osseous lesions. No acute displaced fracture. Multilevel degenerative changes of the spine. Review of the MIP images confirms the above findings. CT ABDOMEN and PELVIS FINDINGS Hepatobiliary: Subcentimeter hypodensities too small to characterize. No focal liver abnormality. Multiple calcified gallstones are noted within the gallbladder lumen. No gallbladder wall thickening or pericholecystic fluid. No biliary dilatation. Pancreas: Diffusely atrophic. No focal lesion. Otherwise normal pancreatic contour. No surrounding inflammatory changes. No main pancreatic ductal dilatation. Spleen: Normal in size without focal abnormality. Adrenals/Urinary Tract: No adrenal nodule bilaterally. Bilateral kidneys enhance symmetrically. Bilateral renal cortical scarring. No hydronephrosis. No hydroureter. The urinary bladder is unremarkable. On delayed imaging, there is no urothelial wall thickening and there are no filling defects in the opacified portions of the bilateral collecting systems or ureters. Stomach/Bowel: Stomach is within normal limits. No evidence of bowel wall thickening or dilatation. Scattered colonic diverticulosis. The appendix not definitely identified with no right lower quadrant inflammatory changes to suggest acute appendicitis. Vascular/Lymphatic: No abdominal aorta or iliac aneurysm. Severe atherosclerotic plaque of the aorta and its branches. No abdominal, pelvic, or inguinal lymphadenopathy. Reproductive: Status post hysterectomy. No adnexal masses. Other: No intraperitoneal free fluid. No intraperitoneal free gas. No organized fluid collection. Musculoskeletal: Status post ventral wall hernia repair with mesh. Diastasis rectus but no recurrent hernia. No suspicious lytic or blastic osseous lesions. No acute displaced fracture. Multilevel degenerative changes of the spine. Lipomatous lesion along the anterior aspect of the right  scapula (4:21). Review of the MIP images confirms the above findings. IMPRESSION: 1. No pulmonary embolus. 2. Left lower lobe infection/inflammation with associated bronchial wall thickening and mucous plugging. 3. Cholelithiasis with no acute cholecystitis or choledocholithiasis. 4. Scattered colonic diverticulosis with no acute diverticulitis. 5.  Aortic Atherosclerosis (ICD10-I70.0). Electronically Signed   By: Iven Finn M.D.   On: 02/25/2021 18:43   CT ABDOMEN PELVIS W CONTRAST  Result Date: 02/25/2021 CLINICAL DATA:  Vomiting, fever, congestion, abdominal pain. Positive COVID 12 days ago. Hx of DM, HTN, Diverticulitis, hemicolectomy, hysterectomy, hernia repair. EXAM: CT ANGIOGRAPHY CHEST CT ABDOMEN AND PELVIS WITH CONTRAST TECHNIQUE: Multidetector CT imaging of the chest was performed using the standard protocol during bolus administration of intravenous contrast. Multiplanar CT image reconstructions and MIPs were obtained to evaluate the vascular anatomy. Multidetector CT imaging of the abdomen and pelvis was performed using the standard protocol during bolus administration of intravenous contrast. CONTRAST:  40m OMNIPAQUE IOHEXOL 350 MG/ML SOLN COMPARISON:  CT abdomen pelvis 04/02/2020 FINDINGS: CTA CHEST FINDINGS Cardiovascular: Satisfactory opacification of the pulmonary arteries to the segmental level. No evidence of pulmonary embolism. The main pulmonary artery is normal in caliber. Normal heart size. No significant pericardial effusion. The thoracic aorta is normal in caliber. Severe calcified and noncalcified atherosclerotic plaque of the thoracic aorta. At least 2 vessel coronary artery calcifications. Possible small hiatal hernia. Mediastinum/Nodes: No enlarged mediastinal, hilar, or axillary lymph nodes. Thyroid gland, trachea, and esophagus demonstrate no significant findings. Lungs/Pleura: Left lower lobe patchy airspace opacity with associated bronchial wall thickening and mucous  plugging. Scattered pulmonary micronodules. No pulmonary mass. No pleural  effusion. No pneumothorax. Diffuse bronchial wall thickening. Musculoskeletal: No chest wall abnormality. No suspicious lytic or blastic osseous lesions. No acute displaced fracture. Multilevel degenerative changes of the spine. Review of the MIP images confirms the above findings. CT ABDOMEN and PELVIS FINDINGS Hepatobiliary: Subcentimeter hypodensities too small to characterize. No focal liver abnormality. Multiple calcified gallstones are noted within the gallbladder lumen. No gallbladder wall thickening or pericholecystic fluid. No biliary dilatation. Pancreas: Diffusely atrophic. No focal lesion. Otherwise normal pancreatic contour. No surrounding inflammatory changes. No main pancreatic ductal dilatation. Spleen: Normal in size without focal abnormality. Adrenals/Urinary Tract: No adrenal nodule bilaterally. Bilateral kidneys enhance symmetrically. Bilateral renal cortical scarring. No hydronephrosis. No hydroureter. The urinary bladder is unremarkable. On delayed imaging, there is no urothelial wall thickening and there are no filling defects in the opacified portions of the bilateral collecting systems or ureters. Stomach/Bowel: Stomach is within normal limits. No evidence of bowel wall thickening or dilatation. Scattered colonic diverticulosis. The appendix not definitely identified with no right lower quadrant inflammatory changes to suggest acute appendicitis. Vascular/Lymphatic: No abdominal aorta or iliac aneurysm. Severe atherosclerotic plaque of the aorta and its branches. No abdominal, pelvic, or inguinal lymphadenopathy. Reproductive: Status post hysterectomy. No adnexal masses. Other: No intraperitoneal free fluid. No intraperitoneal free gas. No organized fluid collection. Musculoskeletal: Status post ventral wall hernia repair with mesh. Diastasis rectus but no recurrent hernia. No suspicious lytic or blastic osseous  lesions. No acute displaced fracture. Multilevel degenerative changes of the spine. Lipomatous lesion along the anterior aspect of the right scapula (4:21). Review of the MIP images confirms the above findings. IMPRESSION: 1. No pulmonary embolus. 2. Left lower lobe infection/inflammation with associated bronchial wall thickening and mucous plugging. 3. Cholelithiasis with no acute cholecystitis or choledocholithiasis. 4. Scattered colonic diverticulosis with no acute diverticulitis. 5.  Aortic Atherosclerosis (ICD10-I70.0). Electronically Signed   By: Iven Finn M.D.   On: 02/25/2021 18:43   DG CHEST PORT 1 VIEW  Result Date: 02/27/2021 CLINICAL DATA:  Pneumonia due to COVID-19, history diabetes mellitus, hypertension, tested positive for COVID-19 on 02/25/2021 EXAM: PORTABLE CHEST 1 VIEW COMPARISON:  Portable exam 1028 hours compared to 02/25/2021 FINDINGS: Borderline enlargement of cardiac silhouette. Mediastinal contours and pulmonary vascularity normal. Atherosclerotic calcification aorta. Bronchitic changes with bibasilar atelectasis versus less likely infiltrate. Upper lungs clear. No pleural effusion or pneumothorax. Bones demineralized. IMPRESSION: Bronchitic changes with bibasilar atelectasis or less likely infiltrates. Aortic Atherosclerosis (ICD10-I70.0). Electronically Signed   By: Lavonia Dana M.D.   On: 02/27/2021 11:07   DG Chest Portable 1 View  Result Date: 02/25/2021 CLINICAL DATA:  Cough.  COVID positive 12 days ago.  Fever. EXAM: PORTABLE CHEST 1 VIEW COMPARISON:  Chest x-ray dated 07/09/2019. FINDINGS: Heart size and mediastinal contours are stable. Lungs are clear. No pleural effusion or pneumothorax is seen. Osseous structures about the chest are unremarkable. IMPRESSION: No active cardiopulmonary disease. No evidence of pneumonia or pulmonary edema. Electronically Signed   By: Franki Cabot M.D.   On: 02/25/2021 15:20        Discharge Exam: Vitals:   02/26/21 2205  02/27/21 0502  BP: (!) 155/76 (!) 117/45  Pulse: 81 66  Resp: 18 18  Temp: 98.2 F (36.8 C) 98.1 F (36.7 C)  SpO2: 96% 95%   Vitals:   02/25/21 2356 02/26/21 0343 02/26/21 2205 02/27/21 0502  BP: 139/65 (!) 132/52 (!) 155/76 (!) 117/45  Pulse: 68 89 81 66  Resp: '18 18 18 18  '$ Temp: 98.4 F (  36.9 C) 98 F (36.7 C) 98.2 F (36.8 C) 98.1 F (36.7 C)  TempSrc: Oral Oral    SpO2: 95% 98% 96% 95%  Weight:      Height:        General: Pt is alert, awake, not in acute distress Cardiovascular: RRR, S1/S2 +, no rubs, no gallops Respiratory: bibasilar rales.  No wheeze Abdominal: Soft, NT, ND, bowel sounds + Extremities: no edema, no cyanosis   The results of significant diagnostics from this hospitalization (including imaging, microbiology, ancillary and laboratory) are listed below for reference.    Significant Diagnostic Studies: CT Angio Chest PE W and/or Wo Contrast  Result Date: 02/25/2021 CLINICAL DATA:  Vomiting, fever, congestion, abdominal pain. Positive COVID 12 days ago. Hx of DM, HTN, Diverticulitis, hemicolectomy, hysterectomy, hernia repair. EXAM: CT ANGIOGRAPHY CHEST CT ABDOMEN AND PELVIS WITH CONTRAST TECHNIQUE: Multidetector CT imaging of the chest was performed using the standard protocol during bolus administration of intravenous contrast. Multiplanar CT image reconstructions and MIPs were obtained to evaluate the vascular anatomy. Multidetector CT imaging of the abdomen and pelvis was performed using the standard protocol during bolus administration of intravenous contrast. CONTRAST:  64m OMNIPAQUE IOHEXOL 350 MG/ML SOLN COMPARISON:  CT abdomen pelvis 04/02/2020 FINDINGS: CTA CHEST FINDINGS Cardiovascular: Satisfactory opacification of the pulmonary arteries to the segmental level. No evidence of pulmonary embolism. The main pulmonary artery is normal in caliber. Normal heart size. No significant pericardial effusion. The thoracic aorta is normal in caliber. Severe  calcified and noncalcified atherosclerotic plaque of the thoracic aorta. At least 2 vessel coronary artery calcifications. Possible small hiatal hernia. Mediastinum/Nodes: No enlarged mediastinal, hilar, or axillary lymph nodes. Thyroid gland, trachea, and esophagus demonstrate no significant findings. Lungs/Pleura: Left lower lobe patchy airspace opacity with associated bronchial wall thickening and mucous plugging. Scattered pulmonary micronodules. No pulmonary mass. No pleural effusion. No pneumothorax. Diffuse bronchial wall thickening. Musculoskeletal: No chest wall abnormality. No suspicious lytic or blastic osseous lesions. No acute displaced fracture. Multilevel degenerative changes of the spine. Review of the MIP images confirms the above findings. CT ABDOMEN and PELVIS FINDINGS Hepatobiliary: Subcentimeter hypodensities too small to characterize. No focal liver abnormality. Multiple calcified gallstones are noted within the gallbladder lumen. No gallbladder wall thickening or pericholecystic fluid. No biliary dilatation. Pancreas: Diffusely atrophic. No focal lesion. Otherwise normal pancreatic contour. No surrounding inflammatory changes. No main pancreatic ductal dilatation. Spleen: Normal in size without focal abnormality. Adrenals/Urinary Tract: No adrenal nodule bilaterally. Bilateral kidneys enhance symmetrically. Bilateral renal cortical scarring. No hydronephrosis. No hydroureter. The urinary bladder is unremarkable. On delayed imaging, there is no urothelial wall thickening and there are no filling defects in the opacified portions of the bilateral collecting systems or ureters. Stomach/Bowel: Stomach is within normal limits. No evidence of bowel wall thickening or dilatation. Scattered colonic diverticulosis. The appendix not definitely identified with no right lower quadrant inflammatory changes to suggest acute appendicitis. Vascular/Lymphatic: No abdominal aorta or iliac aneurysm. Severe  atherosclerotic plaque of the aorta and its branches. No abdominal, pelvic, or inguinal lymphadenopathy. Reproductive: Status post hysterectomy. No adnexal masses. Other: No intraperitoneal free fluid. No intraperitoneal free gas. No organized fluid collection. Musculoskeletal: Status post ventral wall hernia repair with mesh. Diastasis rectus but no recurrent hernia. No suspicious lytic or blastic osseous lesions. No acute displaced fracture. Multilevel degenerative changes of the spine. Lipomatous lesion along the anterior aspect of the right scapula (4:21). Review of the MIP images confirms the above findings. IMPRESSION: 1. No pulmonary embolus. 2.  Left lower lobe infection/inflammation with associated bronchial wall thickening and mucous plugging. 3. Cholelithiasis with no acute cholecystitis or choledocholithiasis. 4. Scattered colonic diverticulosis with no acute diverticulitis. 5.  Aortic Atherosclerosis (ICD10-I70.0). Electronically Signed   By: Iven Finn M.D.   On: 02/25/2021 18:43   CT ABDOMEN PELVIS W CONTRAST  Result Date: 02/25/2021 CLINICAL DATA:  Vomiting, fever, congestion, abdominal pain. Positive COVID 12 days ago. Hx of DM, HTN, Diverticulitis, hemicolectomy, hysterectomy, hernia repair. EXAM: CT ANGIOGRAPHY CHEST CT ABDOMEN AND PELVIS WITH CONTRAST TECHNIQUE: Multidetector CT imaging of the chest was performed using the standard protocol during bolus administration of intravenous contrast. Multiplanar CT image reconstructions and MIPs were obtained to evaluate the vascular anatomy. Multidetector CT imaging of the abdomen and pelvis was performed using the standard protocol during bolus administration of intravenous contrast. CONTRAST:  42m OMNIPAQUE IOHEXOL 350 MG/ML SOLN COMPARISON:  CT abdomen pelvis 04/02/2020 FINDINGS: CTA CHEST FINDINGS Cardiovascular: Satisfactory opacification of the pulmonary arteries to the segmental level. No evidence of pulmonary embolism. The main  pulmonary artery is normal in caliber. Normal heart size. No significant pericardial effusion. The thoracic aorta is normal in caliber. Severe calcified and noncalcified atherosclerotic plaque of the thoracic aorta. At least 2 vessel coronary artery calcifications. Possible small hiatal hernia. Mediastinum/Nodes: No enlarged mediastinal, hilar, or axillary lymph nodes. Thyroid gland, trachea, and esophagus demonstrate no significant findings. Lungs/Pleura: Left lower lobe patchy airspace opacity with associated bronchial wall thickening and mucous plugging. Scattered pulmonary micronodules. No pulmonary mass. No pleural effusion. No pneumothorax. Diffuse bronchial wall thickening. Musculoskeletal: No chest wall abnormality. No suspicious lytic or blastic osseous lesions. No acute displaced fracture. Multilevel degenerative changes of the spine. Review of the MIP images confirms the above findings. CT ABDOMEN and PELVIS FINDINGS Hepatobiliary: Subcentimeter hypodensities too small to characterize. No focal liver abnormality. Multiple calcified gallstones are noted within the gallbladder lumen. No gallbladder wall thickening or pericholecystic fluid. No biliary dilatation. Pancreas: Diffusely atrophic. No focal lesion. Otherwise normal pancreatic contour. No surrounding inflammatory changes. No main pancreatic ductal dilatation. Spleen: Normal in size without focal abnormality. Adrenals/Urinary Tract: No adrenal nodule bilaterally. Bilateral kidneys enhance symmetrically. Bilateral renal cortical scarring. No hydronephrosis. No hydroureter. The urinary bladder is unremarkable. On delayed imaging, there is no urothelial wall thickening and there are no filling defects in the opacified portions of the bilateral collecting systems or ureters. Stomach/Bowel: Stomach is within normal limits. No evidence of bowel wall thickening or dilatation. Scattered colonic diverticulosis. The appendix not definitely identified with no  right lower quadrant inflammatory changes to suggest acute appendicitis. Vascular/Lymphatic: No abdominal aorta or iliac aneurysm. Severe atherosclerotic plaque of the aorta and its branches. No abdominal, pelvic, or inguinal lymphadenopathy. Reproductive: Status post hysterectomy. No adnexal masses. Other: No intraperitoneal free fluid. No intraperitoneal free gas. No organized fluid collection. Musculoskeletal: Status post ventral wall hernia repair with mesh. Diastasis rectus but no recurrent hernia. No suspicious lytic or blastic osseous lesions. No acute displaced fracture. Multilevel degenerative changes of the spine. Lipomatous lesion along the anterior aspect of the right scapula (4:21). Review of the MIP images confirms the above findings. IMPRESSION: 1. No pulmonary embolus. 2. Left lower lobe infection/inflammation with associated bronchial wall thickening and mucous plugging. 3. Cholelithiasis with no acute cholecystitis or choledocholithiasis. 4. Scattered colonic diverticulosis with no acute diverticulitis. 5.  Aortic Atherosclerosis (ICD10-I70.0). Electronically Signed   By: MIven FinnM.D.   On: 02/25/2021 18:43   DG CHEST PORT 1 VIEW  Result Date:  02/27/2021 CLINICAL DATA:  Pneumonia due to COVID-19, history diabetes mellitus, hypertension, tested positive for COVID-19 on 02/25/2021 EXAM: PORTABLE CHEST 1 VIEW COMPARISON:  Portable exam 1028 hours compared to 02/25/2021 FINDINGS: Borderline enlargement of cardiac silhouette. Mediastinal contours and pulmonary vascularity normal. Atherosclerotic calcification aorta. Bronchitic changes with bibasilar atelectasis versus less likely infiltrate. Upper lungs clear. No pleural effusion or pneumothorax. Bones demineralized. IMPRESSION: Bronchitic changes with bibasilar atelectasis or less likely infiltrates. Aortic Atherosclerosis (ICD10-I70.0). Electronically Signed   By: Lavonia Dana M.D.   On: 02/27/2021 11:07   DG Chest Portable 1  View  Result Date: 02/25/2021 CLINICAL DATA:  Cough.  COVID positive 12 days ago.  Fever. EXAM: PORTABLE CHEST 1 VIEW COMPARISON:  Chest x-ray dated 07/09/2019. FINDINGS: Heart size and mediastinal contours are stable. Lungs are clear. No pleural effusion or pneumothorax is seen. Osseous structures about the chest are unremarkable. IMPRESSION: No active cardiopulmonary disease. No evidence of pneumonia or pulmonary edema. Electronically Signed   By: Franki Cabot M.D.   On: 02/25/2021 15:20    Microbiology: Recent Results (from the past 240 hour(s))  Blood culture (routine x 2)     Status: None (Preliminary result)   Collection Time: 02/25/21  3:09 PM   Specimen: BLOOD  Result Value Ref Range Status   Specimen Description BLOOD  Final   Special Requests NONE  Final   Culture   Final    NO GROWTH 2 DAYS Performed at [facility], [address], [place], [place] [address]    Report Status PENDING  Incomplete  Blood culture (routine x 2)     Status: None (Preliminary result)   Collection Time: 02/25/21  4:58 PM   Specimen: Right Antecubital; Blood  Result Value Ref Range Status   Specimen Description   Final    RIGHT ANTECUBITAL BOTTLES DRAWN AEROBIC AND ANAEROBIC   Special Requests Blood Culture adequate volume  Final   Culture   Final    NO GROWTH 2 DAYS Performed at [facility], [address] [place], [place] [address]    Report Status PENDING  Incomplete  Resp Panel by RT-PCR (Flu A&B, Covid) Nasopharyngeal Swab     Status: Abnormal   Collection Time: 02/25/21  5:05 PM   Specimen: Nasopharyngeal Swab; Nasopharyngeal(NP) swabs in vial transport medium  Result Value Ref Range Status   SARS Coronavirus 2 by RT PCR POSITIVE (A) NEGATIVE Final    Comment: CRITICAL RESULT CALLED TO, READ BACK BY AND VERIFIED WITH: DAVIS,N [id number] 02/25/2021 COLEMAN,R (NOTE) SARS-CoV-2 target nucleic acids are DETECTED.  The SARS-CoV-2 RNA is generally detectable in upper respiratory specimens during  the acute phase of infection. Positive results are indicative of the presence of the identified virus, but do not rule out bacterial infection or co-infection with other pathogens not detected by the test. Clinical correlation with patient history and other diagnostic information is necessary to determine patient infection status. The expected result is Negative.  Fact Sheet for Patients: EntrepreneurPulse.com.au  Fact Sheet for Healthcare Providers: IncredibleEmployment.be  This test is not yet approved or cleared by the [place] FDA and  has been authorized for detection and/or diagnosis of SARS-CoV-2 by FDA under an Emergency Use Authorization (EUA).  This EUA will remain in effect (meaning this te st can be used) for the duration of  the COVID-19 declaration under Section 564(b)(1) of the Act, 21 U.S.C. section 360bbb-3(b)(1), unless the authorization is terminated or revoked sooner.     Influenza A by PCR  NEGATIVE NEGATIVE Final   Influenza B by PCR NEGATIVE NEGATIVE Final    Comment: (NOTE) The Xpert Xpress SARS-CoV-2/FLU/RSV plus assay is intended as an aid in the diagnosis of influenza from Nasopharyngeal swab specimens and should not be used as a sole basis for treatment. Nasal washings and aspirates are unacceptable for Xpert Xpress SARS-CoV-2/FLU/RSV testing.  Fact Sheet for Patients: EntrepreneurPulse.com.au  Fact Sheet for Healthcare Providers: IncredibleEmployment.be  This test is not yet approved or cleared by the [place] FDA and has been authorized for detection and/or diagnosis of SARS-CoV-2 by FDA under an Emergency Use Authorization (EUA). This EUA will remain in effect (meaning this test can be used) for the duration of the COVID-19 declaration under Section 564(b)(1) of the Act, 21 U.S.C. section 360bbb-3(b)(1), unless the authorization is terminated  or revoked.  Performed at [facility], [address], [place], [place] [address]      Labs: Basic Metabolic Panel: Recent Labs  Lab 02/25/21 1437 02/26/21 0613 02/27/21 1026  NA 127* 131* 136  K 3.5 3.7 3.7  CL 91* 99 103  CO2 25 20* 24  GLUCOSE 184* 229* 182*  BUN '13 16 18  '$ CREATININE 0.94 0.89 0.74  CALCIUM 8.9 7.9* 8.6*   Liver Function Tests: Recent Labs  Lab 02/25/21 1437 02/26/21 0613 02/27/21 1026  AST '18 17 20  '$ ALT '17 14 15  '$ ALKPHOS 89 75 69  BILITOT 1.4* 0.6 0.3  PROT 7.6 6.4* 6.4*  ALBUMIN 4.2 3.3* 3.5   Recent Labs  Lab 02/25/21 1437  LIPASE 18   No results for input(s): AMMONIA in the last 168 hours. CBC: Recent Labs  Lab 02/25/21 1437 02/26/21 0613 02/27/21 1026  WBC 18.3* 13.2* 10.2  NEUTROABS 16.7*  --   --   HGB 12.7 10.9* 11.2*  HCT 38.2 34.1* 36.2  MCV 87.2 90.9 92.1  PLT 248 148* 206   Cardiac Enzymes: No results for input(s): CKTOTAL, CKMB, CKMBINDEX, TROPONINI in the last 168 hours. BNP: Invalid input(s): POCBNP CBG: Recent Labs  Lab 02/26/21 1140 02/26/21 1718 02/26/21 2158 02/27/21 0716 02/27/21 1110  GLUCAP 136* 142* 154* 155* 154*    Time coordinating discharge:  36 minutes  Signed:  Orson Eva, DO [facility] Pager: [phone] 02/27/2021, 11:43 AM

## 2021-02-27 NOTE — Progress Notes (Signed)
Nsg Discharge Note  Admit Date:  02/25/2021 Discharge date: 02/27/2021   [name] to be D/C'd  [facility]  per MD order.  AVS completed.  Copy for chart, and copy for patient signed, and dated. Patient/caregiver able to verbalize understanding.  Discharge Medication: Allergies as of 02/27/2021       Reactions   Hydrocortisone Itching   Lisinopril    Other reaction(s): Lethargy (intolerance)   Phenergan [promethazine Hcl] Other (See Comments)   hallucinations   Statins Other (See Comments)   Severe myalgias   Trazodone And Nefazodone Cough   Prednisone Rash        Medication List     STOP taking these medications    DropSafe Safety Pen Needles 31G X 6 MM Misc Generic drug: Insulin Pen Needle   EasyMax Test test strip Generic drug: glucose blood   hydrochlorothiazide 25 MG tablet Commonly known as: HYDRODIURIL   QC Hemorrhoidal 0.25-14-74.9 % rectal ointment Generic drug: phenylephrine-shark liver oil-mineral oil-petrolatum       TAKE these medications    Acetaminophen Extra Strength 500 MG tablet Generic drug: acetaminophen TAKE (1) TABLET BY MOUTH EVERY (6) HOURS AS NEEDED FOR PAIN.**DO NOT EXCEED 8 TABLETS IN 24 HOURS** What changed: See the new instructions.   ALPRAZolam 0.25 MG tablet Commonly known as: XANAX TAKE 1 TABLET BY MOUTH TWICE A DAY.   amLODipine 10 MG tablet Commonly known as: NORVASC Take 1 tablet (10 mg total) by mouth daily.   azithromycin 500 MG tablet Commonly known as: ZITHROMAX Take 1 tablet (500 mg total) by mouth daily. X 4 days   beta carotene w/minerals tablet TAKE 1 TABLET BY MOUTH AT BEDTIME.   cefdinir 300 MG capsule Commonly known as: OMNICEF Take 1 capsule (300 mg total) by mouth every 12 (twelve) hours. X 4 days   dexamethasone 6 MG tablet Commonly known as: DECADRON Take 1 tablet (6 mg total) by mouth daily. X 7 days   dicyclomine 10 MG capsule Commonly known as: Bentyl 1 bid prn abd cramps and loose  stool What changed:  how much to take how to take this when to take this reasons to take this additional instructions   famotidine 20 MG tablet Commonly known as: PEPCID TAKE 1 TABLET BY MOUTH EACH MORNING What changed: See the new instructions.   hydrALAZINE 25 MG tablet Commonly known as: APRESOLINE Take 1 tablet (25 mg total) by mouth 2 (two) times daily.   hydrOXYzine 25 MG tablet Commonly known as: ATARAX/VISTARIL TAKE 1 TABLET BY MOUTH 3 TIMES A DAY AS NEEDED FOR ITCHING.   losartan 50 MG tablet Commonly known as: COZAAR TAKE (1) TABLET BY MOUTH ONCE DAILY.   metFORMIN 500 MG tablet Commonly known as: GLUCOPHAGE Take one tablet '500mg'$  po in the morning and 1/2 tablet 250 mg po at supper   Olopatadine HCl 0.2 % Soln Place 1 drop into both eyes daily.   pantoprazole 20 MG tablet Commonly known as: PROTONIX TAKE (1) TABLET BY MOUTH ONCE DAILY.   Theratears 0.25 % Soln Generic drug: Carboxymethylcellulose Sodium Apply 1 drop to eye 3 (three) times daily as needed (dry eyes).   Vitamin B-12 2500 MCG Subl TAKE 1/2 TABLET BY MOUTH AT BEDTIME.   Vitamin D (Cholecalciferol) 10 MCG (400 UNIT) Tabs TAKE 1 TABLET BY MOUTH ONCE DAILY.        Discharge Assessment: Vitals:   02/27/21 0502 02/27/21 1408  BP: (!) 117/45 (!) 151/53  Pulse: 66 69  Resp: 18 16  Temp: 98.1 F (36.7 C) 97.9 F (36.6 C)  SpO2: 95% 100%   Skin clean, dry and intact without evidence of skin break down, no evidence of skin tears noted. IV catheter discontinued intact. Site without signs and symptoms of complications - no redness or edema noted at insertion site, patient denies c/o pain - only slight tenderness at site.  Dressing with slight pressure applied.  D/c Instructions-Education: Discharge instructions given to patient/family with verbalized understanding. D/c education completed with patient/family including follow up instructions, medication list, d/c activities limitations if  indicated, with other d/c instructions as indicated by MD - patient able to verbalize understanding, all questions fully answered. Patient instructed to return to ED, call 911, or call MD for any changes in condition.  Patient escorted via [place], and D/C home via private auto.  Radene Gunning King,LPN [id number] PM

## 2021-02-27 NOTE — NC FL2 (Signed)
[place] LEVEL OF CARE SCREENING TOOL     IDENTIFICATION  Patient Name: [name] Birthdate: [birth year] Sex: female Admission Date (Current Location): 02/25/2021  [facility] [place] Number:  [employer] and Address:  [place] [address], [place]      Provider Number: [id number]  Attending Physician Name and Address:  Orson Eva, MD  Relative Name and Phone Number:  Tarry Kos (Spouse)   [phone]    Current Level of Care: Hospital Recommended Level of Care: [place] Prior Approval Number:    Date Approved/Denied:   PASRR Number:    Discharge Plan: Other (Comment) (ALF)    Current Diagnoses: Patient Active Problem List   Diagnosis Date Noted   Acute respiratory failure with hypoxia ([place]) 02/26/2021   CAP (community acquired pneumonia) 02/25/2021   COVID-19 virus infection 02/25/2021   Myalgia due to statin 02/15/2020   Cognitive dysfunction 03/04/2019   Generalized anxiety disorder 06/05/2018   Vomiting and diarrhea 03/08/2018   Dehydration 03/08/2018   Hyponatremia 03/08/2018   C. difficile diarrhea 03/03/2018   Acute lower UTI 03/03/2018   Hyperlipidemia associated with type 2 diabetes mellitus ([place]) 10/02/2016   Pedal edema 10/02/2016   Insomnia 10/02/2016   SBO (small bowel obstruction) ([place]) 07/02/2016   Dyspnea on exertion 02/24/2016   Other fatigue    E-coli UTI 06/18/2015   Leukocytosis 12/16/2014   Nausea and vomiting 12/15/2014   HLD (hyperlipidemia) 12/15/2014   Essential hypertension 12/15/2014   Controlled type 2 diabetes mellitus without complication, without long-term current use of insulin ([place]) 02/15/2011    Orientation RESPIRATION BLADDER Height & Weight     Self, Time, Situation, Place  Normal Continent Weight: 185 lb 3 oz (84 kg) Height:  '5\' 8"'$  (172.7 cm)  BEHAVIORAL SYMPTOMS/MOOD NEUROLOGICAL BOWEL NUTRITION STATUS      Continent Diet (Diet heart  healthy/carb modified Room service appropriate? Yes; Fluid consistency: Thin)  AMBULATORY STATUS COMMUNICATION OF NEEDS Skin   Limited Assist Verbally Normal                       Personal Care Assistance Level of Assistance  Bathing, Feeding, Dressing Bathing Assistance: Independent Feeding assistance: Independent Dressing Assistance: Limited assistance     Functional Limitations Info  Sight, Hearing, Speech Sight Info: Impaired Hearing Info: Adequate Speech Info: Adequate    SPECIAL CARE FACTORS FREQUENCY                       Contractures Contractures Info: Not present    Additional Factors Info  Code Status, Allergies, Insulin Sliding Scale Code Status Info: Full Allergies Info: Hydrocortisone  Lisinopril  Phenergan (promethazine Hcl)  Statins  Trazodone And Nefazodone  Prednisone   Insulin Sliding Scale Info: CBG < 70: Implement Hypoglycemia Standing Orders and refer to Hypoglycemia Standing Orders sidebar report   CBG 70 - 120: 0 units   CBG 121 - 150: 2 units   CBG 151 - 200: 3 units   CBG 201 - 250: 5 units   CBG 251 - 300: 8 units   CBG 301 - 350: 11 units   CBG 351 - 400: 15 units   CBG > 400 call MD and obtain STAT lab verification       Current Medications (02/27/2021):  This is the current hospital active medication list Current Facility-Administered Medications  Medication Dose Route Frequency Provider Last Rate Last Admin  0.9 %  sodium chloride infusion   Intravenous Continuous Truett Mainland, DO 75 mL/hr at 02/26/21 1758 New Bag at 02/26/21 1758   ALPRAZolam Duanne Moron) tablet 0.25 mg  0.25 mg Oral BID PRN Truett Mainland, DO   0.25 mg at 02/26/21 2208   amLODipine (NORVASC) tablet 10 mg  10 mg Oral Daily Truett Mainland, DO   10 mg at 02/27/21 1011   azithromycin (ZITHROMAX) tablet 500 mg  500 mg Oral Daily Tat, Shanon Brow, MD   500 mg at 02/27/21 1256   cefdinir (OMNICEF) capsule 300 mg  300 mg Oral Therisa Doyne, MD   300 mg at 02/27/21 1255    dexamethasone (DECADRON) tablet 6 mg  6 mg Oral Daily Tat, Shanon Brow, MD   6 mg at 02/27/21 1256   dicyclomine (BENTYL) capsule 10 mg  10 mg Oral BID PRN Truett Mainland, DO   10 mg at 02/26/21 0359   enoxaparin (LOVENOX) injection 40 mg  40 mg Subcutaneous Q24H Truett Mainland, DO   40 mg at 02/27/21 [id number]   insulin aspart (novoLOG) injection 0-15 Units  0-15 Units Subcutaneous TID WC Truett Mainland, DO   3 Units at 02/27/21 1257   insulin aspart (novoLOG) injection 0-5 Units  0-5 Units Subcutaneous QHS Stinson, Jacob J, DO       insulin detemir (LEVEMIR) injection 5 Units  5 Units Subcutaneous Daily Tat, David, MD   5 Units at 02/27/21 1011   ondansetron (ZOFRAN) tablet 4 mg  4 mg Oral Q6H PRN Truett Mainland, DO       Or   ondansetron [facility]) injection 4 mg  4 mg Intravenous Q6H PRN Stinson, Jacob J, DO       pantoprazole (PROTONIX) EC tablet 40 mg  40 mg Oral BID Tat, Shanon Brow, MD   40 mg at 02/27/21 1011   remdesivir 100 mg in sodium chloride 0.9 % 100 mL IVPB  100 mg Intravenous Daily Truett Mainland, DO 200 mL/hr at 02/27/21 1014 100 mg at 02/27/21 1014     Discharge Medications: TAKE these medications     Acetaminophen Extra Strength 500 MG tablet Generic drug: acetaminophen TAKE (1) TABLET BY MOUTH EVERY (6) HOURS AS NEEDED FOR PAIN.**DO NOT EXCEED 8 TABLETS IN 24 HOURS** What changed: See the new instructions.    ALPRAZolam 0.25 MG tablet Commonly known as: XANAX TAKE 1 TABLET BY MOUTH TWICE A DAY.    amLODipine 10 MG tablet Commonly known as: NORVASC Take 1 tablet (10 mg total) by mouth daily.    azithromycin 500 MG tablet Commonly known as: ZITHROMAX Take 1 tablet (500 mg total) by mouth daily. X 4 days    beta carotene w/minerals tablet TAKE 1 TABLET BY MOUTH AT BEDTIME.    cefdinir 300 MG capsule Commonly known as: OMNICEF Take 1 capsule (300 mg total) by mouth every 12 (twelve) hours. X 4 days    dexamethasone 6 MG tablet Commonly known as: DECADRON Take 1  tablet (6 mg total) by mouth daily. X 7 days    dicyclomine 10 MG capsule Commonly known as: Bentyl 1 bid prn abd cramps and loose stool What changed:  how much to take how to take this when to take this reasons to take this additional instructions    famotidine 20 MG tablet Commonly known as: PEPCID TAKE 1 TABLET BY MOUTH EACH MORNING What changed: See the new instructions.    hydrALAZINE 25 MG tablet Commonly known  as: APRESOLINE Take 1 tablet (25 mg total) by mouth 2 (two) times daily.    hydrOXYzine 25 MG tablet Commonly known as: ATARAX/VISTARIL TAKE 1 TABLET BY MOUTH 3 TIMES A DAY AS NEEDED FOR ITCHING.    losartan 50 MG tablet Commonly known as: COZAAR TAKE (1) TABLET BY MOUTH ONCE DAILY.    metFORMIN 500 MG tablet Commonly known as: GLUCOPHAGE Take one tablet '500mg'$  po in the morning and 1/2 tablet 250 mg po at supper    Olopatadine HCl 0.2 % Soln Place 1 drop into both eyes daily.    pantoprazole 20 MG tablet Commonly known as: PROTONIX TAKE (1) TABLET BY MOUTH ONCE DAILY.    Theratears 0.25 % Soln Generic drug: Carboxymethylcellulose Sodium Apply 1 drop to eye 3 (three) times daily as needed (dry eyes).    Vitamin B-12 2500 MCG Subl TAKE 1/2 TABLET BY MOUTH AT BEDTIME.    Vitamin D (Cholecalciferol) 10 MCG (400 UNIT) Tabs TAKE 1 TABLET BY MOUTH ONCE DAILY.      Relevant Imaging Results:  Relevant Lab Results:   Additional Information Pt SSN [id number]  Ihor Gully, LCSW

## 2021-03-01 DIAGNOSIS — S335XXD Sprain of ligaments of lumbar spine, subsequent encounter: Secondary | ICD-10-CM | POA: Diagnosis not present

## 2021-03-01 DIAGNOSIS — I69318 Other symptoms and signs involving cognitive functions following cerebral infarction: Secondary | ICD-10-CM | POA: Diagnosis not present

## 2021-03-01 DIAGNOSIS — E1169 Type 2 diabetes mellitus with other specified complication: Secondary | ICD-10-CM | POA: Diagnosis not present

## 2021-03-01 DIAGNOSIS — I10 Essential (primary) hypertension: Secondary | ICD-10-CM | POA: Diagnosis not present

## 2021-03-01 DIAGNOSIS — M5459 Other low back pain: Secondary | ICD-10-CM | POA: Diagnosis not present

## 2021-03-01 DIAGNOSIS — F0150 Vascular dementia without behavioral disturbance: Secondary | ICD-10-CM | POA: Diagnosis not present

## 2021-03-01 NOTE — Telephone Encounter (Signed)
Patient scheduled hospital follow up with Dr Nicki Reaper Wolfgang [place] 03/07/21 at 11:20am  Patient's only concern stated yesterday was tht her dexamethazone was causing her not to be able to sleep- Consult with Dr Nicki Reaper- decrease med as directed- New medication orders were faxed to [facility].

## 2021-03-02 LAB — CULTURE, BLOOD (ROUTINE X 2)
Culture: NO GROWTH
Culture: NO GROWTH
Special Requests: ADEQUATE

## 2021-03-06 DIAGNOSIS — S335XXD Sprain of ligaments of lumbar spine, subsequent encounter: Secondary | ICD-10-CM | POA: Diagnosis not present

## 2021-03-06 DIAGNOSIS — I10 Essential (primary) hypertension: Secondary | ICD-10-CM | POA: Diagnosis not present

## 2021-03-06 DIAGNOSIS — F0150 Vascular dementia without behavioral disturbance: Secondary | ICD-10-CM | POA: Diagnosis not present

## 2021-03-06 DIAGNOSIS — M5459 Other low back pain: Secondary | ICD-10-CM | POA: Diagnosis not present

## 2021-03-06 DIAGNOSIS — I69318 Other symptoms and signs involving cognitive functions following cerebral infarction: Secondary | ICD-10-CM | POA: Diagnosis not present

## 2021-03-06 DIAGNOSIS — E1169 Type 2 diabetes mellitus with other specified complication: Secondary | ICD-10-CM | POA: Diagnosis not present

## 2021-03-07 ENCOUNTER — Ambulatory Visit (INDEPENDENT_AMBULATORY_CARE_PROVIDER_SITE_OTHER): Payer: Medicare Other | Admitting: Family Medicine

## 2021-03-07 ENCOUNTER — Other Ambulatory Visit: Payer: Self-pay

## 2021-03-07 ENCOUNTER — Other Ambulatory Visit: Payer: Self-pay | Admitting: Family Medicine

## 2021-03-07 VITALS — BP 124/76

## 2021-03-07 DIAGNOSIS — N289 Disorder of kidney and ureter, unspecified: Secondary | ICD-10-CM

## 2021-03-07 DIAGNOSIS — Z8616 Personal history of COVID-19: Secondary | ICD-10-CM

## 2021-03-07 NOTE — Progress Notes (Signed)
   Subjective:    Patient ID: [name], female    DOB: [birth year], 87 y.o.   MRN: [id number]  HPI ER notes, hospital notes, x-rays, lab work reviewed with patient Her current medication list reviewed with patient [place] 2 form was filled out Patient states she is actually feeling much improved compared to where she was Energy level doing better breathing doing better able to do more walking.  Cognitively she feels she is doing well. Patient arrives for a follow up from a recent hospitalization for Covid. Patient states she is feeling much better and doing well.  Review of Systems     Objective:   Physical Exam Lungs clear heart regular HEENT benign extremities no edema blood pressure good control       Assessment & Plan:  Transitional care Medications reviewed Labs x-rays results reviewed with patient Recent pneumonia resolved No need for follow-up x-rays Recent COVID infection result Diabetes stable Blood pressure doing well Has standard follow-up visit in October Kidney function before that follow-up

## 2021-03-08 DIAGNOSIS — I10 Essential (primary) hypertension: Secondary | ICD-10-CM | POA: Diagnosis not present

## 2021-03-08 DIAGNOSIS — F0150 Vascular dementia without behavioral disturbance: Secondary | ICD-10-CM | POA: Diagnosis not present

## 2021-03-08 DIAGNOSIS — E1169 Type 2 diabetes mellitus with other specified complication: Secondary | ICD-10-CM | POA: Diagnosis not present

## 2021-03-08 DIAGNOSIS — S335XXD Sprain of ligaments of lumbar spine, subsequent encounter: Secondary | ICD-10-CM | POA: Diagnosis not present

## 2021-03-08 DIAGNOSIS — I69318 Other symptoms and signs involving cognitive functions following cerebral infarction: Secondary | ICD-10-CM | POA: Diagnosis not present

## 2021-03-08 DIAGNOSIS — M5459 Other low back pain: Secondary | ICD-10-CM | POA: Diagnosis not present

## 2021-03-12 DIAGNOSIS — E1169 Type 2 diabetes mellitus with other specified complication: Secondary | ICD-10-CM | POA: Diagnosis not present

## 2021-03-12 DIAGNOSIS — S335XXD Sprain of ligaments of lumbar spine, subsequent encounter: Secondary | ICD-10-CM | POA: Diagnosis not present

## 2021-03-12 DIAGNOSIS — F0150 Vascular dementia without behavioral disturbance: Secondary | ICD-10-CM | POA: Diagnosis not present

## 2021-03-12 DIAGNOSIS — I10 Essential (primary) hypertension: Secondary | ICD-10-CM | POA: Diagnosis not present

## 2021-03-12 DIAGNOSIS — M5459 Other low back pain: Secondary | ICD-10-CM | POA: Diagnosis not present

## 2021-03-12 DIAGNOSIS — I69318 Other symptoms and signs involving cognitive functions following cerebral infarction: Secondary | ICD-10-CM | POA: Diagnosis not present

## 2021-03-13 DIAGNOSIS — R2689 Other abnormalities of gait and mobility: Secondary | ICD-10-CM | POA: Diagnosis not present

## 2021-03-13 DIAGNOSIS — U071 COVID-19: Secondary | ICD-10-CM | POA: Diagnosis not present

## 2021-03-13 DIAGNOSIS — F0150 Vascular dementia without behavioral disturbance: Secondary | ICD-10-CM | POA: Diagnosis not present

## 2021-03-13 DIAGNOSIS — Z7984 Long term (current) use of oral hypoglycemic drugs: Secondary | ICD-10-CM | POA: Diagnosis not present

## 2021-03-13 DIAGNOSIS — M6281 Muscle weakness (generalized): Secondary | ICD-10-CM | POA: Diagnosis not present

## 2021-03-13 DIAGNOSIS — I69318 Other symptoms and signs involving cognitive functions following cerebral infarction: Secondary | ICD-10-CM | POA: Diagnosis not present

## 2021-03-13 DIAGNOSIS — S335XXD Sprain of ligaments of lumbar spine, subsequent encounter: Secondary | ICD-10-CM

## 2021-03-13 DIAGNOSIS — M5459 Other low back pain: Secondary | ICD-10-CM

## 2021-03-13 DIAGNOSIS — I10 Essential (primary) hypertension: Secondary | ICD-10-CM

## 2021-03-13 DIAGNOSIS — E1169 Type 2 diabetes mellitus with other specified complication: Secondary | ICD-10-CM | POA: Diagnosis not present

## 2021-03-19 DIAGNOSIS — S335XXD Sprain of ligaments of lumbar spine, subsequent encounter: Secondary | ICD-10-CM | POA: Diagnosis not present

## 2021-03-19 DIAGNOSIS — I69318 Other symptoms and signs involving cognitive functions following cerebral infarction: Secondary | ICD-10-CM | POA: Diagnosis not present

## 2021-03-19 DIAGNOSIS — I10 Essential (primary) hypertension: Secondary | ICD-10-CM | POA: Diagnosis not present

## 2021-03-19 DIAGNOSIS — U071 COVID-19: Secondary | ICD-10-CM | POA: Diagnosis not present

## 2021-03-19 DIAGNOSIS — F0150 Vascular dementia without behavioral disturbance: Secondary | ICD-10-CM | POA: Diagnosis not present

## 2021-03-19 DIAGNOSIS — M5459 Other low back pain: Secondary | ICD-10-CM | POA: Diagnosis not present

## 2021-03-20 DIAGNOSIS — U071 COVID-19: Secondary | ICD-10-CM

## 2021-03-21 DIAGNOSIS — I10 Essential (primary) hypertension: Secondary | ICD-10-CM | POA: Diagnosis not present

## 2021-03-21 DIAGNOSIS — M5459 Other low back pain: Secondary | ICD-10-CM | POA: Diagnosis not present

## 2021-03-21 DIAGNOSIS — S335XXD Sprain of ligaments of lumbar spine, subsequent encounter: Secondary | ICD-10-CM | POA: Diagnosis not present

## 2021-03-21 DIAGNOSIS — U071 COVID-19: Secondary | ICD-10-CM | POA: Diagnosis not present

## 2021-03-21 DIAGNOSIS — I69318 Other symptoms and signs involving cognitive functions following cerebral infarction: Secondary | ICD-10-CM | POA: Diagnosis not present

## 2021-03-21 DIAGNOSIS — F0150 Vascular dementia without behavioral disturbance: Secondary | ICD-10-CM | POA: Diagnosis not present

## 2021-03-23 ENCOUNTER — Ambulatory Visit (INDEPENDENT_AMBULATORY_CARE_PROVIDER_SITE_OTHER): Payer: Medicare Other | Admitting: Pharmacist

## 2021-03-23 DIAGNOSIS — E119 Type 2 diabetes mellitus without complications: Secondary | ICD-10-CM

## 2021-03-23 DIAGNOSIS — E1169 Type 2 diabetes mellitus with other specified complication: Secondary | ICD-10-CM

## 2021-03-23 DIAGNOSIS — I10 Essential (primary) hypertension: Secondary | ICD-10-CM

## 2021-03-23 NOTE — Chronic Care Management (AMB) (Signed)
Chronic Care Management Pharmacy Note  03/23/2021 Name:  [name] MRN:  [id number] DOB:  Oct 29, 1931  Summary:  Medication list updated including removal of antibiotics and steroid from recent hospital discharge. Patient reports being "back to normal" and is feeling much improved from recent hospitalization.  Hydrochlorothiazide recently discontinued during hospitalization in September 2022. Confirmed with Sunday Spillers (Med Tech) at [facility] that patient's medication list has been updated and she is no longer receiving hydrochlorothiazide. Continue to monitor blood pressure.  Patient's LDL above goal and patient has statin intolerance. Consider adding ezetimibe 10 mg by mouth once daily. Discussed benefits/risks with patient. Patient will discuss with PCP at next visit.   Subjective: [name] is an 87 y.o. year old female who is a primary patient of Luking, Elayne Snare, MD.  The CCM team was consulted for assistance with disease management and care coordination needs.    Engaged with patient by telephone for follow up visit in response to provider referral for pharmacy case management and/or care coordination services.   Consent to Services:  The patient was given information about Chronic Care Management services, agreed to services, and gave verbal consent prior to initiation of services.  Please see initial visit note for detailed documentation.   Patient Care Team: Kathyrn Drown, MD as PCP - General (Family Medicine) Josue Hector, MD as PCP - Cardiology (Cardiology) Beryle Lathe, [facility] (Pharmacist)  Objective:  Lab Results  Component Value Date   CREATININE 0.74 02/27/2021   CREATININE 0.89 02/26/2021   CREATININE 0.94 02/25/2021    Lab Results  Component Value Date   HGBA1C 6.9 (H) 02/25/2021   Last diabetic Eye exam:  Lab Results  Component Value Date/Time   HMDIABEYEEXA No Retinopathy 11/16/2020 12:00 AM    Last diabetic Foot exam: No results found  for: HMDIABFOOTEX      Component Value Date/Time   CHOL 204 (H) 11/30/2020 0831   TRIG 187 (H) 11/30/2020 0831   HDL 50 11/30/2020 0831   CHOLHDL 4.1 11/30/2020 0831   CHOLHDL 3.3 07/05/2014 0907   VLDL 22 07/05/2014 0907   LDLCALC 121 (H) 11/30/2020 0831    Hepatic Function Latest Ref Rng & Units 02/27/2021 02/26/2021 02/25/2021  Total Protein 6.5 - 8.1 g/dL 6.4(L) 6.4(L) 7.6  Albumin 3.5 - 5.0 g/dL 3.5 3.3(L) 4.2  AST 15 - 41 U/L _0 ALT 0 - 44 U/L _1 Alk Phosphatase 38 - 126 U/L 69 75 89  Total Bilirubin 0.3 - 1.2 mg/dL 0.3 0.6 1.4(H)  Bilirubin, Direct 0.0 - 0.2 mg/dL - - -    Lab Results  Component Value Date/Time   TSH 1.950 12/31/2018 12:26 PM   TSH 1.650 03/11/2016 11:48 AM   FREET4 1.77 03/11/2016 11:48 AM    CBC Latest Ref Rng & Units 02/27/2021 02/26/2021 02/25/2021  WBC 4.0 - 10.5 K/uL 10.2 13.2(H) 18.3(H)  Hemoglobin 12.0 - 15.0 g/dL 11.2(L) 10.9(L) 12.7  Hematocrit 36.0 - 46.0 % 36.2 34.1(L) 38.2  Platelets 150 - 400 K/uL 206 148(L) 248    Lab Results  Component Value Date/Time   VD25OH 34.9 01/12/2019 12:00 PM   VD25OH 29.7 (L) 08/10/2018 12:42 PM    Clinical ASCVD: No  The ASCVD Risk score (Arnett DK, et al., 2019) failed to calculate for the following reasons:   The 2019 ASCVD risk score is only valid for ages 62 to 49    Social History   Tobacco Use  Smoking Status Never  Smokeless Tobacco Never   BP Readings from Last 3 Encounters:  03/07/21 124/76  02/27/21 (!) 151/53  02/20/21 (!) 130/50   Pulse Readings from Last 3 Encounters:  02/27/21 69  01/11/21 87  10/16/20 77   Wt Readings from Last 3 Encounters:  02/25/21 185 lb 3 oz (84 kg)  01/11/21 168 lb (76.2 kg)  10/16/20 176 lb (79.8 kg)    Assessment: Review of patient past medical history, allergies, medications, health status, including review of consultants reports, laboratory and other test data, was performed as part of comprehensive evaluation and provision of  chronic care management services.   SDOH:  (Social Determinants of Health) assessments and interventions performed:    CCM Care Plan  Allergies  Allergen Reactions   Hydrocortisone Itching   Lisinopril     Other reaction(s): Lethargy (intolerance)   Phenergan [Promethazine Hcl] Other (See Comments)    hallucinations   Statins Other (See Comments)    Severe myalgias   Trazodone And Nefazodone Cough   Prednisone Rash    Medications Reviewed Today     Reviewed by Beryle Lathe, [facility] (Pharmacist) on 03/23/21 at 1140  Med List Status: <None>   Medication Order Taking? Sig Documenting Provider Last Dose Status Informant  ACETAMINOPHEN EXTRA STRENGTH 500 MG tablet [id number] Yes TAKE (1) TABLET BY MOUTH EVERY (6) HOURS AS NEEDED FOR PAIN.**DO NOT EXCEED 8 TABLETS IN 24 HOURS**  Patient taking differently: 1,000 mg every 6 (six) hours as needed.   Kathyrn Drown, MD Taking Active   ALPRAZolam Duanne Moron) 0.25 MG tablet [id number] Yes TAKE 1 TABLET BY MOUTH TWICE A DAY. Kathyrn Drown, MD Taking Active Nursing Home Medication Administration Guide (MAG)  amLODipine (NORVASC) 10 MG tablet [id number] Yes Take 1 tablet (10 mg total) by mouth daily. Kathyrn Drown, MD Taking Active Nursing Home Medication Administration Guide (MAG)  azithromycin (ZITHROMAX) 500 MG tablet [id number]  Take 1 tablet (500 mg total) by mouth daily. X 4 days Tat, David, MD  Active   beta carotene w/minerals (OCUVITE) tablet [id number] Yes TAKE 1 TABLET BY MOUTH AT BEDTIME. Kathyrn Drown, MD Taking Active Nursing Home Medication Administration Guide (MAG)  Carboxymethylcellulose Sodium (THERATEARS) 0.25 % SOLN [id number] Yes Apply 1 drop to eye 3 (three) times daily as needed (dry eyes). [provider] Taking Active Nursing Home Medication Administration Guide (MAG)  cefdinir (OMNICEF) 300 MG capsule [id number]  Take 1 capsule (300 mg total) by mouth every 12 (twelve) hours. X 4 days Tat, Shanon Brow, MD  Active    Cyanocobalamin (VITAMIN B-12) 2500 MCG SUBL [id number] Yes TAKE 1/2 TABLET (1250MCG) BY MOUTH AT BEDTIME. Elvia Collum M, DO Taking Active   dexamethasone (DECADRON) 6 MG tablet [id number]  Take 1 tablet (6 mg total) by mouth daily. X 7 days Tat, Shanon Brow, MD  Active   dicyclomine (BENTYL) 10 MG capsule [id number] Yes 1 bid prn abd cramps and loose stool  Patient taking differently: Take 10 mg by mouth 2 (two) times daily as needed (Abdominal cramps and loose stools).   Kathyrn Drown, MD Taking Active   famotidine (PEPCID) 20 MG tablet [id number] Yes TAKE 1 TABLET BY MOUTH EACH MORNING. Elvia Collum M, DO Taking Active   hydrALAZINE (APRESOLINE) 25 MG tablet [id number] Yes Take 1 tablet (25 mg total) by mouth 2 (two) times daily. Kathyrn Drown, MD Taking Active Nursing Home Medication Administration Guide (MAG)  hydrOXYzine (ATARAX/VISTARIL) 25 MG tablet [id number] Yes TAKE 1  TABLET BY MOUTH 3 TIMES A DAY AS NEEDED FOR ITCHING. Kathyrn Drown, MD Taking Active Nursing Home Medication Administration Guide (MAG)  losartan (COZAAR) 50 MG tablet [id number] Yes TAKE (1) TABLET BY MOUTH ONCE DAILY. Kathyrn Drown, MD Taking Active Nursing Home Medication Administration Guide (MAG)  metFORMIN (GLUCOPHAGE) 500 MG tablet [id number] Yes Take one tablet 530m po in the morning and 1/2 tablet 250 mg po at supper LKathyrn Drown MD Taking Active Nursing Home Medication Administration Guide (MAG)  Olopatadine HCl 0.2 % SOLN [id number] Place 1 drop into both eyes daily. [provider] Taking Active Nursing Home Medication Administration Guide (MAG)  pantoprazole (PROTONIX) 20 MG tablet [id number] TAKE (1) TABLET BY MOUTH ONCE DAILY. LKathyrn Drown MD Taking Active Nursing Home Medication Administration Guide (MAG)  Vitamin D, Cholecalciferol, 10 MCG (400 UNIT) TABS [id number] TAKE 1 TABLET BY MOUTH ONCE DAILY. LKathyrn Drown MD Taking Active Nursing Home Medication Administration Guide ([facility]   Med List Note ([address] CPhT 04/03/20 09381: [phone]           Patient Active Problem List   Diagnosis Date Noted   Acute respiratory failure with hypoxia ([place] 02/26/2021   CAP (community acquired pneumonia) 02/25/2021   COVID-19 virus infection 02/25/2021   Myalgia due to statin 02/15/2020   Cognitive dysfunction 03/04/2019   Generalized anxiety disorder 06/05/2018   Vomiting and diarrhea 03/08/2018   Dehydration 03/08/2018   Hyponatremia 03/08/2018   C. difficile diarrhea 03/03/2018   Acute lower UTI 03/03/2018   Hyperlipidemia associated with type 2 diabetes mellitus ([place] 10/02/2016   Pedal edema 10/02/2016   Insomnia 10/02/2016   SBO (small bowel obstruction) ([place] 07/02/2016   Dyspnea on exertion 02/24/2016   Other fatigue    E-coli UTI 06/18/2015   Leukocytosis 12/16/2014   Nausea and vomiting 12/15/2014   HLD (hyperlipidemia) 12/15/2014   Essential hypertension 12/15/2014   Controlled type 2 diabetes mellitus without complication, without long-term current use of insulin ([place] 02/15/2011    Immunization History  Administered Date(s) Administered   Fluad Quad(high Dose 65+) 04/04/2020   Influenza, High Dose Seasonal PF 03/09/2018   Influenza,inj,Quad PF,6+ Mos 03/28/2014, 03/23/2019   Influenza-Unspecified 03/12/2013, 04/16/2016, 04/03/2017, 04/04/2017   Moderna Sars-Covid-2 Vaccination 07/07/2019, 07/28/2019, 08/11/2020   PPD Test 02/09/2019   Pneumococcal Conjugate-13 03/28/2014   Pneumococcal Polysaccharide-23 07/01/2006, 03/23/2019   Tdap 05/01/2011, 01/05/2017   Zoster Recombinat (Shingrix) 04/17/2017, 06/17/2017   Zoster, Live 10/29/2007    Conditions to be addressed/monitored: HTN, HLD, and DMII  Care Plan : Medication Management  Updates made by WBeryle Lathe RBolinassince 03/23/2021 12:00 AM     Problem: HTN, DM2, HLD   Priority: High  Onset Date: 02/23/2021     Long-Range Goal: Disease  Progression Prevention   Start Date: 02/23/2021  Expected End Date: 05/24/2021  Recent Progress: On track  Priority: High  Note:   Current Barriers:  Unable to independently monitor therapeutic efficacy Unable to achieve control of hyperlipidemia  Pharmacist Clinical Goal(s):  Over the next 90 days, patient will Achieve control of hyperlipidemia as evidenced by improved LDL through collaboration with PharmD and provider.   Interventions: 1:1 collaboration with LKathyrn Drown MD regarding development and update of comprehensive plan of care as evidenced by provider attestation and co-signature Inter-disciplinary care team collaboration (see longitudinal plan of care) Comprehensive medication review performed; medication list updated in electronic medical record  Type 2 Diabetes:  Controlled; Most recent A1c at goal of <7% per ADA guidelines Current medications: metformin 500 mg by mouth every morning and 250 mg by mouth  Intolerances: none Taking medications as directed: yes; adminitered by [facility] staff Side effects thought to be attributed to current medication regimen: no Denies hypoglycemic/hyperglycemic symptoms Hypoglycemia prevention: not indicated at this time Current meal patterns: breakfast: eggs, bacon, pancakes, waffles, oatmeal, and AVOIDS BREAD ; lunch: meat and two vegetables with fruit and occasionally cornbread; dinner:  meat and two vegetables with salad ; snacks: graham crackers, yogurt, wafers or half of a sandwhich with wheat bread; drinks: water or Gatorade Zero/BodyArmour (only a few sips per day) Current exercise:  walks at least 1 mile every morning On a statin: _0  Yes  _1  No, intolerant (severe myalgias)    Last microalbumin: 36.3 (11/30/20); on an ACEi/ARB: _2  Yes  _3  No    Last eye exam: appointment in Sept/Oct per patient; last 4-5 months ago per patient Last foot exam: sees Dr. Caprice Beaver every 4 months Pneumonia vaccine: up to  date/completed Current glucose readings: fasting blood glucose: within goal range of 80-130 mg/dL per ADA guidelines Continue metformin 500 mg by mouth every morning and 250 mg every evening  Patient's blood glucose is monitored once daily as fasting by [facility] staff  Hypertension: Current medications: losartan 50 mg by mouth once daily, amlodipine 10 mg by mouth once daily, and hydralazine 25 mg by mouth every 12 hours Hydrochlorothiazide recently discontinued during hospitalization in September 2022. Confirmed with Sunday Spillers (Med Tech) at [facility] that patient's medication list has been updated and she is no longer receiving hydrochlorothiazide  Intolerances:  lisinopril (lethargy) Taking medications as directed: yes, adminitered by [facility] staff Side effects thought to be attributed to current medication regimen: no Denies dizziness, lightheadedness, blurred vision, and headache Home blood pressure readings: 130-140s/60s per patient. [facility] assisted [facility] checks periodically (usually Mon/Wed/Fri) Blood pressure under good control. Blood pressure is at goal of <130/80 mmHg per 2017 AHA/ACC guidelines. Continue losartan 50 mg by mouth once daily, amlodipine 10 mg by mouth once daily, and hydralazine 25 mg by mouth every 12 hours Recommend home blood pressure monitoring to discuss at next visit Discussed that if pill burden becomes an issue in the future, several of her blood pressure medications could be combined   Hyperlipidemia: Current medications:  none Intolerances:  statins (severe myalgias) Taking medications as directed: n/a Side effects thought to be attributed to current medication regimen: n/a Uncontrolled; LDL above goal of <70 due to very high risk given diabetes + at least 1 additional major risk factor (advancing age, elevated LDL cholesterol, and hypertension) per 2020 AACE/ACE guidelines and TG above goal of <150 per 2020 AACE/ACE guidelines Consider  adding ezetimibe 10 mg by mouth once daily. Discussed benefits/risks with patient. Patient will discuss with PCP at next visit.  Encourage dietary reduction of high fat containing foods such as butter, nuts, bacon, egg yolks, etc. Reviewed risks of hyperlipidemia, principles of treatment and consequences of untreated hyperlipidemia Statin intolerance noted. No statin due to serious side effects (ex. Myalgias with at least 2 different statins)  Patient Goals/Self-Care Activities Over the next 90 days, patient will:  Take medications as prescribed Check blood sugar once a day at the following times: fasting (at least 8 hours since last food consumption), document, and provide at future appointments Check blood pressure a few times per week, document, and provide at future appointments  Follow Up Plan: Telephone follow  up appointment with care management team member scheduled for: 04/20/21 Next PCP appointment scheduled for: 04/03/21      Medication Assistance: None required.  Patient affirms current coverage meets needs.  Patient's preferred pharmacy is:  [facility], [place] [address] [place] [address] Phone: [phone] Fax: [phone]  Follow Up:  Patient agrees to Care Plan and Follow-up.  Plan: Telephone follow up appointment with care management team member scheduled for:  04/20/21  Kennon Holter, PharmD Clinical Pharmacist [place] [phone]

## 2021-03-23 NOTE — Patient Instructions (Signed)
[name],  It was great to talk to you today!  Please call me with any questions or concerns.   Visit Information  PATIENT GOALS:  Goals Addressed             This Visit's Progress    Medication Management       Patient Goals/Self-Care Activities Over the next 90  days, patient will:  Take medications as prescribed Check blood sugar once a day at the following times: fasting (at least 8 hours since last food consumption), document, and provide at future appointments Check blood pressure a few times per week, document, and provide at future appointments         The patient verbalized understanding of instructions, educational materials, and care plan provided today and declined offer to receive copy of patient instructions, educational materials, and care plan.   Telephone follow up appointment with care management team member scheduled for:04/20/21  Kennon Holter, PharmD Clinical Pharmacist [place] [phone]

## 2021-03-26 DIAGNOSIS — M5459 Other low back pain: Secondary | ICD-10-CM | POA: Diagnosis not present

## 2021-03-26 DIAGNOSIS — F0150 Vascular dementia without behavioral disturbance: Secondary | ICD-10-CM | POA: Diagnosis not present

## 2021-03-26 DIAGNOSIS — I10 Essential (primary) hypertension: Secondary | ICD-10-CM | POA: Diagnosis not present

## 2021-03-26 DIAGNOSIS — U071 COVID-19: Secondary | ICD-10-CM | POA: Diagnosis not present

## 2021-03-26 DIAGNOSIS — I69318 Other symptoms and signs involving cognitive functions following cerebral infarction: Secondary | ICD-10-CM | POA: Diagnosis not present

## 2021-03-26 DIAGNOSIS — S335XXD Sprain of ligaments of lumbar spine, subsequent encounter: Secondary | ICD-10-CM | POA: Diagnosis not present

## 2021-03-27 DIAGNOSIS — N289 Disorder of kidney and ureter, unspecified: Secondary | ICD-10-CM | POA: Diagnosis not present

## 2021-03-27 DIAGNOSIS — E1142 Type 2 diabetes mellitus with diabetic polyneuropathy: Secondary | ICD-10-CM | POA: Diagnosis not present

## 2021-03-27 DIAGNOSIS — B351 Tinea unguium: Secondary | ICD-10-CM | POA: Diagnosis not present

## 2021-03-27 DIAGNOSIS — L84 Corns and callosities: Secondary | ICD-10-CM | POA: Diagnosis not present

## 2021-03-28 DIAGNOSIS — I10 Essential (primary) hypertension: Secondary | ICD-10-CM | POA: Diagnosis not present

## 2021-03-28 DIAGNOSIS — U071 COVID-19: Secondary | ICD-10-CM | POA: Diagnosis not present

## 2021-03-28 DIAGNOSIS — S335XXD Sprain of ligaments of lumbar spine, subsequent encounter: Secondary | ICD-10-CM | POA: Diagnosis not present

## 2021-03-28 DIAGNOSIS — I69318 Other symptoms and signs involving cognitive functions following cerebral infarction: Secondary | ICD-10-CM | POA: Diagnosis not present

## 2021-03-28 DIAGNOSIS — M5459 Other low back pain: Secondary | ICD-10-CM | POA: Diagnosis not present

## 2021-03-28 DIAGNOSIS — F0150 Vascular dementia without behavioral disturbance: Secondary | ICD-10-CM | POA: Diagnosis not present

## 2021-03-28 LAB — BASIC METABOLIC PANEL
BUN/Creatinine Ratio: 14 (ref 12–28)
BUN: 13 mg/dL (ref 8–27)
CO2: 24 mmol/L (ref 20–29)
Calcium: 9.8 mg/dL (ref 8.7–10.3)
Chloride: 98 mmol/L (ref 96–106)
Creatinine, Ser: 0.95 mg/dL (ref 0.57–1.00)
Glucose: 121 mg/dL — ABNORMAL HIGH (ref 70–99)
Potassium: 4.3 mmol/L (ref 3.5–5.2)
Sodium: 139 mmol/L (ref 134–144)
eGFR: 58 mL/min/{1.73_m2} — ABNORMAL LOW (ref >59)

## 2021-03-30 DIAGNOSIS — I10 Essential (primary) hypertension: Secondary | ICD-10-CM

## 2021-03-30 DIAGNOSIS — E119 Type 2 diabetes mellitus without complications: Secondary | ICD-10-CM

## 2021-03-30 DIAGNOSIS — E785 Hyperlipidemia, unspecified: Secondary | ICD-10-CM

## 2021-03-30 DIAGNOSIS — E1169 Type 2 diabetes mellitus with other specified complication: Secondary | ICD-10-CM

## 2021-04-02 DIAGNOSIS — U071 COVID-19: Secondary | ICD-10-CM | POA: Diagnosis not present

## 2021-04-02 DIAGNOSIS — I10 Essential (primary) hypertension: Secondary | ICD-10-CM | POA: Diagnosis not present

## 2021-04-02 DIAGNOSIS — M5459 Other low back pain: Secondary | ICD-10-CM | POA: Diagnosis not present

## 2021-04-02 DIAGNOSIS — I69318 Other symptoms and signs involving cognitive functions following cerebral infarction: Secondary | ICD-10-CM | POA: Diagnosis not present

## 2021-04-02 DIAGNOSIS — S335XXD Sprain of ligaments of lumbar spine, subsequent encounter: Secondary | ICD-10-CM | POA: Diagnosis not present

## 2021-04-02 DIAGNOSIS — F0150 Vascular dementia without behavioral disturbance: Secondary | ICD-10-CM | POA: Diagnosis not present

## 2021-04-03 ENCOUNTER — Encounter: Payer: Self-pay | Admitting: Family Medicine

## 2021-04-03 ENCOUNTER — Ambulatory Visit (INDEPENDENT_AMBULATORY_CARE_PROVIDER_SITE_OTHER): Payer: Medicare Other | Admitting: Family Medicine

## 2021-04-03 ENCOUNTER — Other Ambulatory Visit: Payer: Self-pay

## 2021-04-03 VITALS — BP 134/76 | HR 67 | Temp 97.2°F | Ht 68.0 in | Wt 173.0 lb

## 2021-04-03 DIAGNOSIS — E1169 Type 2 diabetes mellitus with other specified complication: Secondary | ICD-10-CM

## 2021-04-03 DIAGNOSIS — G4709 Other insomnia: Secondary | ICD-10-CM | POA: Diagnosis not present

## 2021-04-03 DIAGNOSIS — E119 Type 2 diabetes mellitus without complications: Secondary | ICD-10-CM

## 2021-04-03 DIAGNOSIS — K219 Gastro-esophageal reflux disease without esophagitis: Secondary | ICD-10-CM | POA: Diagnosis not present

## 2021-04-03 DIAGNOSIS — Z23 Encounter for immunization: Secondary | ICD-10-CM

## 2021-04-03 DIAGNOSIS — I10 Essential (primary) hypertension: Secondary | ICD-10-CM | POA: Diagnosis not present

## 2021-04-03 DIAGNOSIS — F411 Generalized anxiety disorder: Secondary | ICD-10-CM

## 2021-04-03 DIAGNOSIS — E785 Hyperlipidemia, unspecified: Secondary | ICD-10-CM | POA: Diagnosis not present

## 2021-04-03 DIAGNOSIS — U071 COVID-19: Secondary | ICD-10-CM

## 2021-04-03 MED ORDER — PANTOPRAZOLE SODIUM 20 MG PO TBEC: DELAYED_RELEASE_TABLET | ORAL | 5 refills | Status: DC

## 2021-04-03 MED ORDER — AMLODIPINE BESYLATE 10 MG PO TABS: 10.0000 mg | ORAL_TABLET | Freq: Every day | ORAL | 5 refills | Status: DC

## 2021-04-03 MED ORDER — LOSARTAN POTASSIUM 50 MG PO TABS: ORAL_TABLET | ORAL | 5 refills | Status: DC

## 2021-04-03 MED ORDER — HYDRALAZINE HCL 25 MG PO TABS: 25.0000 mg | ORAL_TABLET | Freq: Two times a day (BID) | ORAL | 5 refills | Status: DC

## 2021-04-03 MED ORDER — METFORMIN HCL 500 MG PO TABS: ORAL_TABLET | ORAL | 6 refills | Status: DC

## 2021-04-03 MED ORDER — ALPRAZOLAM 0.25 MG PO TABS: ORAL_TABLET | ORAL | 5 refills | Status: DC

## 2021-04-03 NOTE — Progress Notes (Signed)
Subjective:    Patient ID: [name], female    DOB: July 26, 1931, 87 y.o.   MRN: [id number]  Hypertension This is a chronic problem. Treatments tried: hydralazine, losartan.   Hyperlipidemia associated with type 2 diabetes mellitus ([place]) - Plan: Urine Microalbumin w/creat. ratio, Hemoglobin A1c, Lipid panel, Hepatic function panel, Basic metabolic panel  Essential hypertension - Plan: amLODipine (NORVASC) 10 MG tablet, losartan (COZAAR) 50 MG tablet, Urine Microalbumin w/creat. ratio, Hemoglobin A1c, Lipid panel, Hepatic function panel, Basic metabolic panel  Generalized anxiety disorder - Plan: ALPRAZolam (XANAX) 0.25 MG tablet, hydrALAZINE (APRESOLINE) 25 MG tablet  Other insomnia - Plan: ALPRAZolam (XANAX) 0.25 MG tablet  Controlled type 2 diabetes mellitus without complication, without long-term current use of insulin (HCC) - Plan: metFORMIN (GLUCOPHAGE) 500 MG tablet, Urine Microalbumin w/creat. ratio, Hemoglobin A1c, Lipid panel, Hepatic function panel, Basic metabolic panel  Gastroesophageal reflux disease without esophagitis - Plan: pantoprazole (PROTONIX) 20 MG tablet, Urine Microalbumin w/creat. ratio, Hemoglobin A1c, Lipid panel, Hepatic function panel, Basic metabolic panel  Flu vaccine need - Plan: Flu Vaccine QUAD High Dose(Fluad) Taking her medication regular basis Try to watch her diet Try and stay active We did discuss recent recommendations Patient does have history of aortic atherosclerosis Does not tolerate statins    Review of Systems     Objective:   Physical Exam  General-in no acute distress Eyes-no discharge Lungs-respiratory rate normal, CTA CV-no murmurs,RRR Extremities skin warm dry no edema Neuro grossly normal Behavior normal, alert       Assessment & Plan:  1. Hyperlipidemia associated with type 2 diabetes mellitus ([place]) Shared discussion with the patient.  We did discuss Zetia.  Discussed risk and benefits.  Patient at this time  does not want to add more medications to her regimen.  She will work hard on healthy eating and regular activity and does not want to retry a statin at this time as well.  We will recheck her lab work again in 4 months with a follow-up office visit - Urine Microalbumin w/creat. ratio - Hemoglobin A1c - Lipid panel - Hepatic function panel - Basic metabolic panel  2. Essential hypertension Blood pressure on recheck under better control continue current measures.  Blood pressure checked sitting in a chair feet on ground - amLODipine (NORVASC) 10 MG tablet; Take 1 tablet (10 mg total) by mouth daily.  Dispense: 30 tablet; Refill: 5 - losartan (COZAAR) 50 MG tablet; TAKE (1) TABLET BY MOUTH ONCE DAILY.  Dispense: 30 tablet; Refill: 5 - Urine Microalbumin w/creat. ratio - Hemoglobin A1c - Lipid panel - Hepatic function panel - Basic metabolic panel  3. Generalized anxiety disorder She does uses Xanax more so in the evening time occasionally during the daytime. - ALPRAZolam (XANAX) 0.25 MG tablet; TAKE 1 TABLET BY MOUTH TWICE A DAY.  Dispense: 60 tablet; Refill: 5 - hydrALAZINE (APRESOLINE) 25 MG tablet; Take 1 tablet (25 mg total) by mouth 2 (two) times daily.  Dispense: 60 tablet; Refill: 5  4. Other insomnia Uses a low-dose Xanax at nighttime to help her with rest - ALPRAZolam (XANAX) 0.25 MG tablet; TAKE 1 TABLET BY MOUTH TWICE A DAY.  Dispense: 60 tablet; Refill: 5  5. Controlled type 2 diabetes mellitus without complication, without long-term current use of insulin (HCC) Diabetes under decent control watch diet continue medication - metFORMIN (GLUCOPHAGE) 500 MG tablet; Take one tablet 500mg  po in the morning and 1/2 tablet 250 mg po at supper  Dispense: 45 tablet; Refill:  6 - Urine Microalbumin w/creat. ratio - Hemoglobin A1c - Lipid panel - Hepatic function panel - Basic metabolic panel  6. Gastroesophageal reflux disease without esophagitis Flu shot today - pantoprazole  (PROTONIX) 20 MG tablet; TAKE (1) TABLET BY MOUTH ONCE DAILY.  Dispense: 30 tablet; Refill: 5 - Urine Microalbumin w/creat. ratio - Hemoglobin A1c - Lipid panel - Hepatic function panel - Basic metabolic panel  7. Flu vaccine need Flu shot today - Flu Vaccine QUAD High Dose(Fluad)

## 2021-04-04 ENCOUNTER — Other Ambulatory Visit: Payer: Self-pay | Admitting: Family Medicine

## 2021-04-09 DIAGNOSIS — I69318 Other symptoms and signs involving cognitive functions following cerebral infarction: Secondary | ICD-10-CM | POA: Diagnosis not present

## 2021-04-09 DIAGNOSIS — S335XXD Sprain of ligaments of lumbar spine, subsequent encounter: Secondary | ICD-10-CM | POA: Diagnosis not present

## 2021-04-09 DIAGNOSIS — I10 Essential (primary) hypertension: Secondary | ICD-10-CM | POA: Diagnosis not present

## 2021-04-09 DIAGNOSIS — M5459 Other low back pain: Secondary | ICD-10-CM | POA: Diagnosis not present

## 2021-04-09 DIAGNOSIS — U071 COVID-19: Secondary | ICD-10-CM | POA: Diagnosis not present

## 2021-04-09 DIAGNOSIS — F0150 Vascular dementia without behavioral disturbance: Secondary | ICD-10-CM | POA: Diagnosis not present

## 2021-04-16 ENCOUNTER — Other Ambulatory Visit: Payer: Self-pay | Admitting: Family Medicine

## 2021-04-20 ENCOUNTER — Ambulatory Visit (INDEPENDENT_AMBULATORY_CARE_PROVIDER_SITE_OTHER): Payer: Medicare Other | Admitting: Pharmacist

## 2021-04-20 DIAGNOSIS — E785 Hyperlipidemia, unspecified: Secondary | ICD-10-CM

## 2021-04-20 DIAGNOSIS — I10 Essential (primary) hypertension: Secondary | ICD-10-CM

## 2021-04-20 DIAGNOSIS — E1169 Type 2 diabetes mellitus with other specified complication: Secondary | ICD-10-CM

## 2021-04-20 DIAGNOSIS — E119 Type 2 diabetes mellitus without complications: Secondary | ICD-10-CM

## 2021-04-20 NOTE — Chronic Care Management (AMB) (Signed)
Chronic Care Management Pharmacy Note  04/20/2021 Name:  [name] MRN:  [id number] DOB:  [birth year]  Summary: Hyperlipidemia: Patient recently discussed benefits/risks of ezetimibe with PCP and decided to defer additional pharmacotherapy at this time. Plan to check lipid panel again in 4 months. If LDL remains above goal at that time, will discuss again with patient.  No acute needs for chronic care management at this time. Follow-up in 3 months.  Subjective: [name] is an 87 y.o. year old female who is a primary patient of Luking, Elayne Snare, MD.  The CCM team was consulted for assistance with disease management and care coordination needs.    Engaged with patient by telephone for follow up visit in response to provider referral for pharmacy case management and/or care coordination services.   Consent to Services:  The patient was given information about Chronic Care Management services, agreed to services, and gave verbal consent prior to initiation of services.  Please see initial visit note for detailed documentation.   Patient Care Team: Kathyrn Drown, MD as PCP - General (Family Medicine) Josue Hector, MD as PCP - Cardiology (Cardiology) Beryle Lathe, [facility] (Pharmacist)  Objective:  Lab Results  Component Value Date   CREATININE 0.95 03/27/2021   CREATININE 0.74 02/27/2021   CREATININE 0.89 02/26/2021    Lab Results  Component Value Date   HGBA1C 6.9 (H) 02/25/2021   Last diabetic Eye exam:  Lab Results  Component Value Date/Time   HMDIABEYEEXA No Retinopathy 11/16/2020 12:00 AM    Last diabetic Foot exam: No results found for: HMDIABFOOTEX      Component Value Date/Time   CHOL 204 (H) 11/30/2020 0831   TRIG 187 (H) 11/30/2020 0831   HDL 50 11/30/2020 0831   CHOLHDL 4.1 11/30/2020 0831   CHOLHDL 3.3 07/05/2014 0907   VLDL 22 07/05/2014 0907   LDLCALC 121 (H) 11/30/2020 0831    Hepatic Function Latest Ref Rng & Units 02/27/2021  02/26/2021 02/25/2021  Total Protein 6.5 - 8.1 g/dL 6.4(L) 6.4(L) 7.6  Albumin 3.5 - 5.0 g/dL 3.5 3.3(L) 4.2  AST 15 - 41 U/L 20 17 18   ALT 0 - 44 U/L 15 14 17   Alk Phosphatase 38 - 126 U/L 69 75 89  Total Bilirubin 0.3 - 1.2 mg/dL 0.3 0.6 1.4(H)  Bilirubin, Direct 0.0 - 0.2 mg/dL - - -    Lab Results  Component Value Date/Time   TSH 1.950 12/31/2018 12:26 PM   TSH 1.650 03/11/2016 11:48 AM   FREET4 1.77 03/11/2016 11:48 AM    CBC Latest Ref Rng & Units 02/27/2021 02/26/2021 02/25/2021  WBC 4.0 - 10.5 K/uL 10.2 13.2(H) 18.3(H)  Hemoglobin 12.0 - 15.0 g/dL 11.2(L) 10.9(L) 12.7  Hematocrit 36.0 - 46.0 % 36.2 34.1(L) 38.2  Platelets 150 - 400 K/uL 206 148(L) 248    Lab Results  Component Value Date/Time   VD25OH 34.9 01/12/2019 12:00 PM   VD25OH 29.7 (L) 08/10/2018 12:42 PM    Clinical ASCVD: No  The ASCVD Risk score (Arnett DK, et al., 2019) failed to calculate for the following reasons:   The 2019 ASCVD risk score is only valid for ages 47 to 41    Social History   Tobacco Use  Smoking Status Never  Smokeless Tobacco Never   BP Readings from Last 3 Encounters:  04/03/21 134/76  03/07/21 124/76  02/27/21 (!) 151/53   Pulse Readings from Last 3 Encounters:  04/03/21 67  02/27/21 69  01/11/21 87   Wt Readings from Last 3 Encounters:  04/03/21 173 lb (78.5 kg)  02/25/21 185 lb 3 oz (84 kg)  01/11/21 168 lb (76.2 kg)    Assessment: Review of patient past medical history, allergies, medications, health status, including review of consultants reports, laboratory and other test data, was performed as part of comprehensive evaluation and provision of chronic care management services.   SDOH:  (Social Determinants of Health) assessments and interventions performed:    CCM Care Plan  Allergies  Allergen Reactions   Hydrocortisone Itching   Lisinopril     Other reaction(s): Lethargy (intolerance)   Phenergan [Promethazine Hcl] Other (See Comments)    hallucinations    Statins Other (See Comments)    Severe myalgias   Trazodone And Nefazodone Cough   Prednisone Rash    Medications Reviewed Today     Reviewed by Beryle Lathe, [facility] (Pharmacist) on 04/20/21 at 1133  Med List Status: <None>   Medication Order Taking? Sig Documenting Provider Last Dose Status Informant  ACETAMINOPHEN EXTRA STRENGTH 500 MG tablet [id number] Yes TAKE (1) TABLET BY MOUTH EVERY (6) HOURS AS NEEDED FOR PAIN.**DO NOT EXCEED 8 TABLETS IN 24 HOURS**  Patient taking differently: 1,000 mg every 6 (six) hours as needed.   Kathyrn Drown, MD Taking Active   ALPRAZolam Duanne Moron) 0.25 MG tablet [id number] Yes TAKE 1 TABLET BY MOUTH TWICE A DAY. Kathyrn Drown, MD Taking Active   amLODipine (NORVASC) 10 MG tablet [id number] Yes Take 1 tablet (10 mg total) by mouth daily. Kathyrn Drown, MD Taking Active   beta carotene w/minerals (OCUVITE) tablet [id number] Yes TAKE 1 TABLET BY MOUTH AT BEDTIME. Kathyrn Drown, MD Taking Active Nursing Home Medication Administration Guide (MAG)  Carboxymethylcellulose Sodium (THERATEARS) 0.25 % SOLN [id number] Yes Apply 1 drop to eye 3 (three) times daily as needed (dry eyes). [provider] Taking Active Nursing Home Medication Administration Guide (MAG)  Cyanocobalamin (VITAMIN B-12) 2500 MCG SUBL [id number] Yes TAKE 1/2 TABLET (1250MCG) BY MOUTH AT BEDTIME. Elvia Collum M, DO Taking Active   dicyclomine (BENTYL) 10 MG capsule [id number] Yes 1 bid prn abd cramps and loose stool  Patient taking differently: Take 10 mg by mouth 2 (two) times daily as needed (Abdominal cramps and loose stools).   Kathyrn Drown, MD Taking Active   famotidine (PEPCID) 20 MG tablet [id number] Yes TAKE 1 TABLET BY MOUTH EACH MORNING. Lovena Le, Malena M, DO Taking Active   glucose blood (EASYMAX TEST) test strip [id number]  CHECK BLOOD SUGAR ONCE DAILY.(CALL MD IF BS BELOW 60: OR IF BS ABOVE 400) Luking, Scott A, MD  Active   hydrALAZINE (APRESOLINE) 25 MG tablet  [id number] Yes Take 1 tablet (25 mg total) by mouth 2 (two) times daily. Kathyrn Drown, MD Taking Active   hydrOXYzine (ATARAX/VISTARIL) 25 MG tablet [id number] Yes TAKE 1 TABLET BY MOUTH 3 TIMES A DAY AS NEEDED FOR ITCHING. Kathyrn Drown, MD Taking Active   losartan (COZAAR) 50 MG tablet [id number] Yes TAKE (1) TABLET BY MOUTH ONCE DAILY. Kathyrn Drown, MD Taking Active   metFORMIN (GLUCOPHAGE) 500 MG tablet [id number] Yes Take one tablet 56m po in the morning and 1/2 tablet 250 mg po at supper LKathyrn Drown MD Taking Active   Olopatadine HCl 0.2 % SOLN [id number] Place 1 drop into both eyes daily. [provider] Taking Active Nursing Home Medication Administration Guide (MAG)  pantoprazole (PROTONIX) 20 MG tablet [id number] TAKE (  1) TABLET BY MOUTH ONCE DAILY. Kathyrn Drown, MD Taking Active   Vitamin D, Cholecalciferol, 10 MCG (400 UNIT) TABS [id number] Yes TAKE 1 TABLET BY MOUTH ONCE DAILY. Kathyrn Drown, MD Taking Active Nursing Home Medication Administration Guide [facility])  Med List Note Scotty Court, CPhT 04/03/20 7989): [place] [phone]            Patient Active Problem List   Diagnosis Date Noted   Acute respiratory failure with hypoxia ([place]) 02/26/2021   CAP (community acquired pneumonia) 02/25/2021   COVID-19 virus infection 02/25/2021   Myalgia due to statin 02/15/2020   Cognitive dysfunction 03/04/2019   Generalized anxiety disorder 06/05/2018   Vomiting and diarrhea 03/08/2018   Dehydration 03/08/2018   Hyponatremia 03/08/2018   C. difficile diarrhea 03/03/2018   Acute lower UTI 03/03/2018   Hyperlipidemia associated with type 2 diabetes mellitus ([place]) 10/02/2016   Pedal edema 10/02/2016   Insomnia 10/02/2016   SBO (small bowel obstruction) ([place]) 07/02/2016   Dyspnea on exertion 02/24/2016   Other fatigue    E-coli UTI 06/18/2015   Leukocytosis 12/16/2014   Nausea and vomiting 12/15/2014   HLD (hyperlipidemia)  12/15/2014   Essential hypertension 12/15/2014   Controlled type 2 diabetes mellitus without complication, without long-term current use of insulin ([place]) 02/15/2011    Immunization History  Administered Date(s) Administered   Fluad Quad(high Dose 65+) 04/04/2020, 04/03/2021   Influenza, High Dose Seasonal PF 03/09/2018   Influenza,inj,Quad PF,6+ Mos 03/28/2014, 03/23/2019   Influenza-Unspecified 03/12/2013, 04/16/2016, 04/03/2017, 04/04/2017   Moderna Sars-Covid-2 Vaccination 07/07/2019, 07/28/2019, 08/11/2020   PPD Test 02/09/2019   Pneumococcal Conjugate-13 03/28/2014   Pneumococcal Polysaccharide-23 07/01/2006, 03/23/2019   Tdap 05/01/2011, 01/05/2017   Zoster Recombinat (Shingrix) 04/17/2017, 06/17/2017   Zoster, Live 10/29/2007    Conditions to be addressed/monitored: HTN, HLD, and DMII  Care Plan : Medication Management  Updates made by Beryle Lathe, [place] since 04/20/2021 12:00 AM     Problem: HTN, DM2, HLD   Priority: High  Onset Date: 02/23/2021     Long-Range Goal: Disease Progression Prevention   Start Date: 02/23/2021  Expected End Date: 05/24/2021  Recent Progress: On track  Priority: High  Note:   Current Barriers:  Unable to independently monitor therapeutic efficacy Unable to achieve control of hyperlipidemia  Pharmacist Clinical Goal(s):  Over the next 90 days, patient will Achieve control of hyperlipidemia as evidenced by improved LDL through collaboration with PharmD and provider.   Interventions: 1:1 collaboration with Kathyrn Drown, MD regarding development and update of comprehensive plan of care as evidenced by provider attestation and co-signature Inter-disciplinary care team collaboration (see longitudinal plan of care) Comprehensive medication review performed; medication list updated in electronic medical record  Type 2 Diabetes: Controlled; Most recent A1c at goal of <7% per ADA guidelines Current medications: metformin 500 mg by  mouth every morning and 250 mg by mouth  Intolerances: none Taking medications as directed: yes; adminitered by [facility] assisted [facility] staff Side effects thought to be attributed to current medication regimen: no Denies hypoglycemic/hyperglycemic symptoms Hypoglycemia prevention: not indicated at this time Current meal patterns: breakfast: eggs, bacon, pancakes, waffles, oatmeal, and AVOIDS BREAD ; lunch: meat and two vegetables with fruit and occasionally cornbread; dinner:  meat and two vegetables with salad ; snacks: graham crackers, yogurt, wafers or half of a sandwhich with wheat bread; drinks: water or Gatorade Zero/BodyArmour (only a few sips per day) Current exercise:  walks at least 1 mile every  morning On a statin: []  Yes  [x]  No, intolerant (severe myalgias)    Last microalbumin: 36.3 (11/30/20); on an ACEi/ARB: [x]  Yes  []  No    Last eye exam: appointment in November per patient; last ~6 months ago per patient Last foot exam: sees Dr. Caprice Beaver every 4 months Pneumonia vaccine: up to date/completed Current glucose readings: fasting blood glucose: within goal range of 80-130 mg/dL per ADA guidelines Continue metformin 500 mg by mouth every morning and 250 mg every evening  Patient's blood glucose is monitored once daily as fasting by [facility] staff  Hypertension: Current medications: losartan 50 mg by mouth once daily, amlodipine 10 mg by mouth once daily, and hydralazine 25 mg by mouth every 12 hours Hydrochlorothiazide recently discontinued during hospitalization in September 2022.  Intolerances:  lisinopril (lethargy) Taking medications as directed: yes, adminitered by [facility] assisted [facility] staff Side effects thought to be attributed to current medication regimen: no Denies dizziness, lightheadedness, blurred vision, and headache Home blood pressure readings: 130s/60s per patient. [facility] assisted [facility] checks periodically (usually Mon/Wed/Fri) Blood pressure under good  control. Blood pressure is at goal of <130/80 mmHg per 2017 AHA/ACC guidelines. Continue losartan 50 mg by mouth once daily, amlodipine 10 mg by mouth once daily, and hydralazine 25 mg by mouth every 12 hours Recommend home blood pressure monitoring to discuss at next visit Discussed that if pill burden becomes an issue in the future, several of her blood pressure medications could be combined   Hyperlipidemia: Current medications:  none Intolerances:  statins (severe myalgias) Taking medications as directed: n/a Side effects thought to be attributed to current medication regimen: n/a Uncontrolled; LDL above goal of <70 due to very high risk given diabetes + at least 1 additional major risk factor (advancing age, elevated LDL cholesterol, and hypertension) per 2020 AACE/ACE guidelines and TG above goal of <150 per 2020 AACE/ACE guidelines Consider adding ezetimibe 10 mg by mouth once daily. Patient discussed benefits/risks with PCP and decided to defer additional pharmacotherapy at this time. Plan to check lipid panel again in 4 months. Encourage dietary reduction of high fat containing foods such as butter, nuts, bacon, egg yolks, etc. Reviewed risks of hyperlipidemia, principles of treatment and consequences of untreated hyperlipidemia Statin intolerance noted. No statin due to serious side effects (ex. Myalgias with at least 2 different statins)  Patient Goals/Self-Care Activities Over the next 90 days, patient will:  Take medications as prescribed Check blood sugar once a day at the following times: fasting (at least 8 hours since last food consumption), document, and provide at future appointments Check blood pressure a few times per week, document, and provide at future appointments  Follow Up Plan: Telephone follow up appointment with care management team member scheduled for: 07/13/21      Medication Assistance: None required.  Patient affirms current coverage meets  needs.  Patient's preferred pharmacy is:  [facility], [place] [facility] [address] [place] [address] Phone: [phone] Fax: [phone]  Follow Up:  Patient agrees to Care Plan and Follow-up.  Plan: Telephone follow up appointment with care management team member scheduled for:  07/13/21  Kennon Holter, PharmD Clinical Pharmacist [place] [phone]

## 2021-04-20 NOTE — Patient Instructions (Signed)
[name],  It was great to talk to you today!  Please call me with any questions or concerns.   Visit Information  PATIENT GOALS:  Goals Addressed             This Visit's Progress    Medication Management       Patient Goals/Self-Care Activities Over the next 90  days, patient will:  Take medications as prescribed Check blood sugar once a day at the following times: fasting (at least 8 hours since last food consumption), document, and provide at future appointments Check blood pressure a few times per week, document, and provide at future appointments           The patient verbalized understanding of instructions, educational materials, and care plan provided today and declined offer to receive copy of patient instructions, educational materials, and care plan.   Telephone follow up appointment with care management team member scheduled for:07/13/21  Kennon Holter, PharmD Clinical Pharmacist [place] [phone]

## 2021-04-23 DIAGNOSIS — Z209 Contact with and (suspected) exposure to unspecified communicable disease: Secondary | ICD-10-CM | POA: Insufficient documentation

## 2021-04-24 ENCOUNTER — Ambulatory Visit: Payer: Medicare Other

## 2021-04-25 ENCOUNTER — Telehealth: Payer: Self-pay | Admitting: Family Medicine

## 2021-04-25 DIAGNOSIS — M5459 Other low back pain: Secondary | ICD-10-CM | POA: Diagnosis not present

## 2021-04-25 DIAGNOSIS — I10 Essential (primary) hypertension: Secondary | ICD-10-CM | POA: Diagnosis not present

## 2021-04-25 DIAGNOSIS — Z8616 Personal history of COVID-19: Secondary | ICD-10-CM | POA: Diagnosis not present

## 2021-04-25 NOTE — Telephone Encounter (Signed)
Patient stated she called yesterday to cancel her [facility] visit. She was under the impression that it was a phone call visit. She lives in [place] and was not able to attend appointment/call. She does want to reschedule. Please advise.  CB#  [phone]

## 2021-04-30 DIAGNOSIS — E785 Hyperlipidemia, unspecified: Secondary | ICD-10-CM

## 2021-04-30 DIAGNOSIS — E119 Type 2 diabetes mellitus without complications: Secondary | ICD-10-CM

## 2021-04-30 DIAGNOSIS — I10 Essential (primary) hypertension: Secondary | ICD-10-CM | POA: Diagnosis not present

## 2021-04-30 DIAGNOSIS — E1169 Type 2 diabetes mellitus with other specified complication: Secondary | ICD-10-CM | POA: Diagnosis not present

## 2021-05-17 DIAGNOSIS — M1711 Unilateral primary osteoarthritis, right knee: Secondary | ICD-10-CM | POA: Diagnosis not present

## 2021-05-21 LAB — HM DIABETES EYE EXAM

## 2021-05-22 DIAGNOSIS — H353110 Nonexudative age-related macular degeneration, right eye, stage unspecified: Secondary | ICD-10-CM | POA: Diagnosis not present

## 2021-05-23 DIAGNOSIS — M1711 Unilateral primary osteoarthritis, right knee: Secondary | ICD-10-CM | POA: Diagnosis not present

## 2021-05-30 DIAGNOSIS — M1711 Unilateral primary osteoarthritis, right knee: Secondary | ICD-10-CM | POA: Diagnosis not present

## 2021-06-01 ENCOUNTER — Other Ambulatory Visit: Payer: Self-pay | Admitting: Family Medicine

## 2021-06-04 ENCOUNTER — Encounter: Payer: Self-pay | Admitting: Family Medicine

## 2021-06-05 DIAGNOSIS — E1142 Type 2 diabetes mellitus with diabetic polyneuropathy: Secondary | ICD-10-CM | POA: Diagnosis not present

## 2021-06-05 DIAGNOSIS — B351 Tinea unguium: Secondary | ICD-10-CM | POA: Diagnosis not present

## 2021-06-05 DIAGNOSIS — L84 Corns and callosities: Secondary | ICD-10-CM | POA: Diagnosis not present

## 2021-06-08 ENCOUNTER — Telehealth: Payer: Self-pay | Admitting: *Deleted

## 2021-06-08 NOTE — Telephone Encounter (Signed)
[facility] left message on machine stating the patient woke up with sore throat and cough and she is requesting a cough medication to be called in for her.

## 2021-06-08 NOTE — Telephone Encounter (Signed)
I would recommend OTC Robitussin-DM or Delsym

## 2021-06-08 NOTE — Telephone Encounter (Addendum)
Advised April at [facility] that Dr Nicki Reaper recommends Robitussin DM or Delysm- verbalized understanding.

## 2021-07-12 ENCOUNTER — Telehealth: Payer: Self-pay | Admitting: Family Medicine

## 2021-07-12 NOTE — Telephone Encounter (Signed)
Please advise what to do with form from [facility] on patient. Put in your yellow folder. Will she need appointment to complete. In your office

## 2021-07-12 NOTE — Telephone Encounter (Signed)
Nurses to keep form Front- please try to move up appt by 1 week if possible It is not emergent (so therefore only if there is a slot otherwise keep as is) Thanks

## 2021-07-13 ENCOUNTER — Telehealth: Payer: Medicare Other

## 2021-07-13 NOTE — Telephone Encounter (Signed)
FYI-Unfortunately no available slots for afternoon visits patient  doesn't like morning appointment she has to get a ride from adult living is better for her afternoon.

## 2021-07-13 NOTE — Telephone Encounter (Signed)
FYI

## 2021-07-13 NOTE — Telephone Encounter (Signed)
Please move pt appt up to earlier date as requested by PCP. Thank you!

## 2021-07-14 NOTE — Telephone Encounter (Signed)
She may keep the appointment in early February we will fill out her form at that time thank you

## 2021-07-23 ENCOUNTER — Ambulatory Visit (INDEPENDENT_AMBULATORY_CARE_PROVIDER_SITE_OTHER): Payer: Medicare Other | Admitting: Pharmacist

## 2021-07-23 DIAGNOSIS — E785 Hyperlipidemia, unspecified: Secondary | ICD-10-CM

## 2021-07-23 DIAGNOSIS — E119 Type 2 diabetes mellitus without complications: Secondary | ICD-10-CM

## 2021-07-23 DIAGNOSIS — I10 Essential (primary) hypertension: Secondary | ICD-10-CM

## 2021-07-23 DIAGNOSIS — E1169 Type 2 diabetes mellitus with other specified complication: Secondary | ICD-10-CM

## 2021-07-23 NOTE — Chronic Care Management (AMB) (Signed)
Chronic Care Management Pharmacy Note  07/23/2021 Name:  [name] MRN:  [id number] DOB:  Jan 19, 1932  Summary: Type 2 Diabetes: Current glucose readings (fasting): mostly within goal range of 80-130 mg/dL per ADA guidelines. Patient verbally reports blood glucose of 133 this morning Continue metformin 500 mg by mouth every morning and 250 mg every evening  Check A1c, renal function, and urinary microalbumin in 1 week.  Hypertension: Continue losartan 50 mg by mouth once daily, amlodipine 10 mg by mouth once daily, and hydralazine 25 mg by mouth every 12 hours Check blood pressure in office in 2 weeks at primary care provider visit  Hyperlipidemia: Uncontrolled; LDL above goal of <70 due to very high risk given diabetes + at least 1 additional major risk factor (advancing age, elevated LDL cholesterol, and hypertension) per 2020 AACE/ACE guidelines and TG above goal of <150 per 2020 AACE/ACE guidelines Current medications: none; last treated with gemfibrozil >5 years ago Intolerances: statins (severe myalgias) - unsure which statins  Consider adding ezetimibe 10 mg by mouth once daily. Patient previously discussed benefits/risks with PCP and decided to defer pharmacotherapy. Check lipid panel in 1 week and discuss again with PCP.   Subjective: [name] is an 88 y.o. year old female who is a primary patient of Luking, Elayne Snare, MD.  The CCM team was consulted for assistance with disease management and care coordination needs.    Engaged with patient by telephone for follow up visit in response to provider referral for pharmacy case management and/or care coordination services.   Consent to Services:  The patient was given information about Chronic Care Management services, agreed to services, and gave verbal consent prior to initiation of services.  Please see initial visit note for detailed documentation.   Patient Care Team: Kathyrn Drown, MD as PCP - General (Family  Medicine) Josue Hector, MD as PCP - Cardiology (Cardiology) Beryle Lathe, [facility] (Pharmacist)  Objective:  Lab Results  Component Value Date   CREATININE 0.95 03/27/2021   CREATININE 0.74 02/27/2021   CREATININE 0.89 02/26/2021    Lab Results  Component Value Date   HGBA1C 6.9 (H) 02/25/2021   Last diabetic Eye exam:  Lab Results  Component Value Date/Time   HMDIABEYEEXA No Retinopathy 05/21/2021 12:00 AM    Last diabetic Foot exam: No results found for: HMDIABFOOTEX      Component Value Date/Time   CHOL 204 (H) 11/30/2020 0831   TRIG 187 (H) 11/30/2020 0831   HDL 50 11/30/2020 0831   CHOLHDL 4.1 11/30/2020 0831   CHOLHDL 3.3 07/05/2014 0907   VLDL 22 07/05/2014 0907   LDLCALC 121 (H) 11/30/2020 0831    Hepatic Function Latest Ref Rng & Units 02/27/2021 02/26/2021 02/25/2021  Total Protein 6.5 - 8.1 g/dL 6.4(L) 6.4(L) 7.6  Albumin 3.5 - 5.0 g/dL 3.5 3.3(L) 4.2  AST 15 - 41 U/L _0 ALT 0 - 44 U/L _1 Alk Phosphatase 38 - 126 U/L 69 75 89  Total Bilirubin 0.3 - 1.2 mg/dL 0.3 0.6 1.4(H)  Bilirubin, Direct 0.0 - 0.2 mg/dL - - -    Lab Results  Component Value Date/Time   TSH 1.950 12/31/2018 12:26 PM   TSH 1.650 03/11/2016 11:48 AM   FREET4 1.77 03/11/2016 11:48 AM    CBC Latest Ref Rng & Units 02/27/2021 02/26/2021 02/25/2021  WBC 4.0 - 10.5 K/uL 10.2 13.2(H) 18.3(H)  Hemoglobin 12.0 - 15.0 g/dL 11.2(L) 10.9(L) 12.7  Hematocrit 36.0 - 46.0 % 36.2 34.1(L) 38.2  Platelets 150 - 400 K/uL 206 148(L) 248    Lab Results  Component Value Date/Time   VD25OH 34.9 01/12/2019 12:00 PM   VD25OH 29.7 (L) 08/10/2018 12:42 PM    Clinical ASCVD: No  The ASCVD Risk score (Arnett DK, et al., 2019) failed to calculate for the following reasons:   The 2019 ASCVD risk score is only valid for ages 27 to 52    Social History   Tobacco Use  Smoking Status Never  Smokeless Tobacco Never   BP Readings from Last 3 Encounters:  04/03/21 134/76  03/07/21  124/76  02/27/21 (!) 151/53   Pulse Readings from Last 3 Encounters:  04/03/21 67  02/27/21 69  01/11/21 87   Wt Readings from Last 3 Encounters:  04/03/21 173 lb (78.5 kg)  02/25/21 185 lb 3 oz (84 kg)  01/11/21 168 lb (76.2 kg)    Assessment: Review of patient past medical history, allergies, medications, health status, including review of consultants reports, laboratory and other test data, was performed as part of comprehensive evaluation and provision of chronic care management services.   SDOH:  (Social Determinants of Health) assessments and interventions performed:    CCM Care Plan  Allergies  Allergen Reactions   Hydrocortisone Itching   Lisinopril     Other reaction(s): Lethargy (intolerance)   Phenergan [Promethazine Hcl] Other (See Comments)    hallucinations   Statins Other (See Comments)    Severe myalgias   Trazodone And Nefazodone Cough   Prednisone Rash    Medications Reviewed Today     Reviewed by Beryle Lathe, [facility] (Pharmacist) on 07/23/21 at 1038  Med List Status: <None>   Medication Order Taking? Sig Documenting Provider Last Dose Status Informant  ACETAMINOPHEN EXTRA STRENGTH 500 MG tablet [id number] Yes TAKE (1) TABLET BY MOUTH EVERY (6) HOURS AS NEEDED FOR PAIN.**DO NOT EXCEED 8 TABLETS IN 24 HOURS**  Patient taking differently: 1,000 mg every 6 (six) hours as needed.   Kathyrn Drown, MD Taking Active   ALPRAZolam Duanne Moron) 0.25 MG tablet [id number] Yes TAKE 1 TABLET BY MOUTH TWICE A DAY. Kathyrn Drown, MD Taking Active   amLODipine (NORVASC) 10 MG tablet [id number] Yes Take 1 tablet (10 mg total) by mouth daily. Kathyrn Drown, MD Taking Active   beta carotene w/minerals (OCUVITE) tablet [id number] Yes TAKE 1 TABLET BY MOUTH AT BEDTIME. Kathyrn Drown, MD Taking Active Nursing Home Medication Administration Guide (MAG)  Carboxymethylcellulose Sodium (THERATEARS) 0.25 % SOLN [id number] Yes Apply 1 drop to eye 3 (three) times daily as needed  (dry eyes). [provider] Taking Active Nursing Home Medication Administration Guide (MAG)  Cyanocobalamin (VITAMIN B-12) 2500 MCG SUBL [id number] Yes TAKE 1/2 TABLET (1250MCG) BY MOUTH AT BEDTIME. Elvia Collum M, DO Taking Active   dicyclomine (BENTYL) 10 MG capsule [id number] Yes 1 bid prn abd cramps and loose stool  Patient taking differently: Take 10 mg by mouth 2 (two) times daily as needed (Abdominal cramps and loose stools).   Kathyrn Drown, MD Taking Active   famotidine (PEPCID) 20 MG tablet [id number] Yes TAKE 1 TABLET BY MOUTH EACH MORNING. Lovena Le, Malena M, DO Taking Active   glucose blood (EASYMAX TEST) test strip [id number]  CHECK BLOOD SUGAR ONCE DAILY.(CALL MD IF BS BELOW 60: OR IF BS ABOVE 400) Luking, Scott A, MD  Active   hydrALAZINE (APRESOLINE) 25 MG tablet [id number] Yes Take 1 tablet (25 mg total)  by mouth 2 (two) times daily. Kathyrn Drown, MD Taking Active   hydrOXYzine (ATARAX) 25 MG tablet [id number] Yes TAKE 1 TABLET BY MOUTH 3 TIMES A DAY AS NEEDED FOR ITCHING. Kathyrn Drown, MD Taking Active   losartan (COZAAR) 50 MG tablet [id number] Yes TAKE (1) TABLET BY MOUTH ONCE DAILY. Kathyrn Drown, MD Taking Active   metFORMIN (GLUCOPHAGE) 500 MG tablet [id number] Yes Take one tablet 531m po in the morning and 1/2 tablet 250 mg po at supper LKathyrn Drown MD Taking Active   Olopatadine HCl 0.2 % SOLN [id number] Place 1 drop into both eyes daily. [provider] Taking Active Nursing Home Medication Administration Guide (MAG)  pantoprazole (PROTONIX) 20 MG tablet [id number] TAKE (1) TABLET BY MOUTH ONCE DAILY. LKathyrn Drown MD Taking Active   Vitamin D, Cholecalciferol, 10 MCG (400 UNIT) TABS [id number] TAKE 1 TABLET BY MOUTH ONCE DAILY. LKathyrn Drown MD Taking Active Nursing Home Medication Administration Guide ([facility]  Med List Note ([address] CPhT 04/03/20 [address]: [place]([phone]           Patient Active  Problem List   Diagnosis Date Noted   Exposure to communicable disease 04/23/2021   Acute respiratory failure with hypoxia (HCC) 02/26/2021   CAP (community acquired pneumonia) 02/25/2021   COVID-19 virus infection 02/25/2021   Myalgia due to statin 02/15/2020   Cognitive dysfunction 03/04/2019   Generalized anxiety disorder 06/05/2018   Floaters in visual field, bilateral 03/23/2018   Living in assisted living 03/23/2018   Vomiting and diarrhea 03/08/2018   Dehydration 03/08/2018   Hyponatremia 03/08/2018   C. difficile diarrhea 03/03/2018   Acute lower UTI 03/03/2018   Osteoarthritis of right knee 12/02/2017   Closed fracture of left orbital floor with routine healing 01/17/2017   Hyperlipidemia associated with type 2 diabetes mellitus ([place] 10/02/2016   Pedal edema 10/02/2016   Insomnia 10/02/2016   SBO (small bowel obstruction) ([place] 07/02/2016   Dyspnea on exertion 02/24/2016   Other fatigue    E-coli UTI 06/18/2015   Leukocytosis 12/16/2014   Nausea and vomiting 12/15/2014   HLD (hyperlipidemia) 12/15/2014   Essential hypertension 12/15/2014   Controlled type 2 diabetes mellitus without complication, without long-term current use of insulin ([place] 02/15/2011    Immunization History  Administered Date(s) Administered   Fluad Quad(high Dose 65+) 04/04/2020, 04/03/2021   Influenza, High Dose Seasonal PF 03/09/2018   Influenza,inj,Quad PF,6+ Mos 03/28/2014, 03/23/2019   Influenza-Unspecified 03/12/2013, 04/16/2016, 04/03/2017, 04/04/2017   Moderna Sars-Covid-2 Vaccination 07/07/2019, 07/28/2019, 08/11/2020   PPD Test 02/09/2019   Pneumococcal Conjugate-13 03/28/2014   Pneumococcal Polysaccharide-23 07/01/2006, 03/23/2019   Tdap 05/01/2011, 01/05/2017   Zoster Recombinat (Shingrix) 04/17/2017, 06/17/2017   Zoster, Live 10/29/2007    Conditions to be addressed/monitored: HTN, HLD, and DMII  Care Plan : Medication Management  Updates made by WBeryle Lathe  RD[place] 07/23/2021 12:00 AM     Problem: HTN, DM2, HLD   Priority: High  Onset Date: 02/23/2021     Long-Range Goal: Disease Progression Prevention   Start Date: 02/23/2021  Expected End Date: 05/24/2021  Recent Progress: On track  Priority: High  Note:   Current Barriers:  Unable to independently monitor therapeutic efficacy Unable to achieve control of hyperlipidemia  Pharmacist Clinical Goal(s):  Over the next 90 days, patient will Achieve control of hyperlipidemia as evidenced by improved LDL through collaboration with PharmD and provider.   Interventions: 1:1 collaboration  with Kathyrn Drown, MD regarding development and update of comprehensive plan of care as evidenced by provider attestation and co-signature Inter-disciplinary care team collaboration (see longitudinal plan of care) Comprehensive medication review performed; medication list updated in electronic medical record  Type 2 Diabetes: Controlled; Most recent A1c at goal of <7% per ADA guidelines Current medications: metformin 500 mg by mouth every morning and 250 mg by mouth  Intolerances: none Taking medications as directed: yes; adminitered by [facility] assisted [facility] staff Side effects thought to be attributed to current medication regimen: no Denies hypoglycemic/hyperglycemic symptoms Hypoglycemia prevention: not indicated at this time Current meal patterns: breakfast: eggs, bacon, pancakes, waffles, oatmeal, and AVOIDS BREAD ; lunch: meat and two vegetables with fruit and occasionally cornbread; dinner:  meat and two vegetables with salad ; snacks: graham crackers, yogurt, wafers or half of a sandwhich with wheat bread; drinks: water or Gatorade Zero/BodyArmour (only a few sips per day) Current exercise:  walks at least 1 mile every morning On a statin: _0  Yes  _1  No, intolerant (severe myalgias)    Last microalbumin: 36.3 (11/30/20); on an ACEi/ARB: _2  Yes  _3  No    Last eye exam: completed within last  year Last foot exam: sees Dr. Caprice Beaver every 4 months Pneumonia vaccine: up to date/completed Current glucose readings: fasting blood glucose: mostly within goal range of 80-130 mg/dL per ADA guidelines. Patient verbally reports blood glucose of 133 this morning Continue metformin 500 mg by mouth every morning and 250 mg every evening  Patient's fasting blood glucose is monitored once daily by [facility] staff Check A1c, renal function, and urinary microalbumin in 1 week.  Hypertension: Blood pressure under good control. Blood pressure is at goal of <130/80 mmHg per 2017 AHA/ACC guidelines. Current medications: losartan 50 mg by mouth once daily, amlodipine 10 mg by mouth once daily, and hydralazine 25 mg by mouth every 12 hours Intolerances:  lisinopril (lethargy) Taking medications as directed: yes, adminitered by [facility] staff Side effects thought to be attributed to current medication regimen: no Denies dizziness, lightheadedness, blurred vision, and headache Home blood pressure readings: 130s/60s per patient. [facility] checks periodically (usually Mon/Wed/Fri) Continue losartan 50 mg by mouth once daily, amlodipine 10 mg by mouth once daily, and hydralazine 25 mg by mouth every 12 hours Recommend home blood pressure monitoring to discuss at next visit Check blood pressure in office in 2 weeks at primary care provider visit  Hyperlipidemia: Uncontrolled; LDL above goal of <70 due to very high risk given diabetes + at least 1 additional major risk factor (advancing age, elevated LDL cholesterol, and hypertension) per 2020 AACE/ACE guidelines and TG above goal of <150 per 2020 AACE/ACE guidelines Current medications: none; last treated with gemfibrozil >5 years ago Intolerances: statins (severe myalgias) - unsure which statins  Taking medications as directed: n/a Side effects thought to be attributed to current medication regimen: n/a Encourage dietary  reduction of high fat containing foods such as butter, nuts, bacon, egg yolks, etc. Reviewed risks of hyperlipidemia, principles of treatment and consequences of untreated hyperlipidemia Statin intolerance noted. No statin due to serious side effects (ex. Myalgias with at least 2 different statins) Consider adding ezetimibe 10 mg by mouth once daily. Patient previously discussed benefits/risks with PCP and decided to defer pharmacotherapy. Check lipid panel in 1 week and discuss again with PCP.   Patient Goals/Self-Care Activities Over the next 90 days, patient will:  Take medications as prescribed Check blood sugar once a day  at the following times: fasting (at least 8 hours since last food consumption), document, and provide at future appointments Check blood pressure a few times per week, document, and provide at future appointments  Follow Up Plan: Telephone follow up appointment with care management team member scheduled for: 08/06/21      Medication Assistance: None required.  Patient affirms current coverage meets needs.  Patient's preferred pharmacy is:  [facility], [place] [facility] [address] [place] [address] Phone: [phone] Fax: [phone]  Follow Up:  Patient agrees to Care Plan and Follow-up.  Plan: Telephone follow up appointment with care management team member scheduled for:  08/06/21  Kennon Holter, PharmD, BCACP, CPP Clinical Pharmacist Practitioner [place] [phone]

## 2021-07-23 NOTE — Patient Instructions (Signed)
[name],  It was great to talk to you today!  Please call me with any questions or concerns.  Visit Information  Following are the goals we discussed today:   Goals Addressed             This Visit's Progress    Medication Management       Patient Goals/Self-Care Activities Over the next 90  days, patient will:  Take medications as prescribed Check blood sugar once a day at the following times: fasting (at least 8 hours since last food consumption), document, and provide at future appointments Check blood pressure a few times per week, document, and provide at future appointments             Follow-up plan: Telephone follow up appointment with care management team member scheduled for:  08/06/21 Future Appointments  Date Time Provider [place]  08/06/2021 11:15 AM [facility] PHARMACIST [facility] [place]  08/07/2021  1:10 PM Kathyrn Drown, MD [facility] [place]    The patient verbalized understanding of instructions, educational materials, and care plan provided today and declined offer to receive copy of patient instructions, educational materials, and care plan.   Please call the care guide team at [phone] if you need to cancel or reschedule your appointment.   Kennon Holter, PharmD, [place], CPP Clinical Pharmacist Practitioner [place] [phone]

## 2021-07-31 DIAGNOSIS — E785 Hyperlipidemia, unspecified: Secondary | ICD-10-CM

## 2021-07-31 DIAGNOSIS — E1169 Type 2 diabetes mellitus with other specified complication: Secondary | ICD-10-CM | POA: Diagnosis not present

## 2021-07-31 DIAGNOSIS — I10 Essential (primary) hypertension: Secondary | ICD-10-CM

## 2021-07-31 DIAGNOSIS — E119 Type 2 diabetes mellitus without complications: Secondary | ICD-10-CM

## 2021-07-31 DIAGNOSIS — K219 Gastro-esophageal reflux disease without esophagitis: Secondary | ICD-10-CM | POA: Diagnosis not present

## 2021-08-01 LAB — HEPATIC FUNCTION PANEL
ALT: 23 IU/L (ref 0–32)
AST: 18 IU/L (ref 0–40)
Albumin: 4.6 g/dL (ref 3.6–4.6)
Alkaline Phosphatase: 104 IU/L (ref 44–121)
Bilirubin Total: 0.8 mg/dL (ref 0.0–1.2)
Bilirubin, Direct: 0.19 mg/dL (ref 0.00–0.40)
Total Protein: 6.8 g/dL (ref 6.0–8.5)

## 2021-08-01 LAB — BASIC METABOLIC PANEL
BUN/Creatinine Ratio: 18 (ref 12–28)
BUN: 17 mg/dL (ref 8–27)
CO2: 27 mmol/L (ref 20–29)
Calcium: 10.2 mg/dL (ref 8.7–10.3)
Chloride: 98 mmol/L (ref 96–106)
Creatinine, Ser: 0.94 mg/dL (ref 0.57–1.00)
Glucose: 126 mg/dL — ABNORMAL HIGH (ref 70–99)
Potassium: 4.8 mmol/L (ref 3.5–5.2)
Sodium: 138 mmol/L (ref 134–144)
eGFR: 59 mL/min/{1.73_m2} — ABNORMAL LOW (ref >59)

## 2021-08-01 LAB — LIPID PANEL
Chol/HDL Ratio: 4.1 ratio (ref 0.0–4.4)
Cholesterol, Total: 187 mg/dL (ref 100–199)
HDL: 46 mg/dL (ref >39)
LDL Chol Calc (NIH): 87 mg/dL (ref 0–99)
Triglycerides: 328 mg/dL — ABNORMAL HIGH (ref 0–149)
VLDL Cholesterol Cal: 54 mg/dL — ABNORMAL HIGH (ref 5–40)

## 2021-08-01 LAB — MICROALBUMIN / CREATININE URINE RATIO
Creatinine, Urine: 20 mg/dL
Microalb/Creat Ratio: 192 mg/g creat — ABNORMAL HIGH (ref 0–29)
Microalbumin, Urine: 38.3 ug/mL

## 2021-08-01 LAB — HEMOGLOBIN A1C
Est. average glucose Bld gHb Est-mCnc: 154 mg/dL
Hgb A1c MFr Bld: 7 % — ABNORMAL HIGH (ref 4.8–5.6)

## 2021-08-06 ENCOUNTER — Ambulatory Visit (INDEPENDENT_AMBULATORY_CARE_PROVIDER_SITE_OTHER): Payer: Medicare Other | Admitting: Pharmacist

## 2021-08-06 DIAGNOSIS — E119 Type 2 diabetes mellitus without complications: Secondary | ICD-10-CM

## 2021-08-06 DIAGNOSIS — E1169 Type 2 diabetes mellitus with other specified complication: Secondary | ICD-10-CM

## 2021-08-06 DIAGNOSIS — I10 Essential (primary) hypertension: Secondary | ICD-10-CM

## 2021-08-06 NOTE — Chronic Care Management (AMB) (Signed)
Chronic Care Management Pharmacy Note  08/06/2021 Name:  [name] MRN:  [id number] DOB:  Feb 21, 1932  Summary: General: All labs from 07/31/21 were reviewed with patient today. She verbalized understanding. A1c stable with slight increase Microalb/Creat Ratio increase 114>192 LDL improved 121>87 Triglycerides increased 187>328 Liver function tests within normal limits BMP stable from prior  Type 2 Diabetes Controlled; Most recent A1c increased from 6.9% to 7.0% which remains at goal per ADA guidelines Continue metformin 500 mg by mouth every morning and 250 mg every evening  Patient may be an ideal candidate for SGLT-2 inhibitor therapy to improve blood glucose. Patient continues to have increasing albuminuria despite angiotensin receptor blocker. Discussed with patient today and patient would like to have risk/benefit discussion with primary care provider at appointment tomorrow. Patient plans to call her insurance agent to discuss cost estimate. She does not think she will qualify for medication assistance programs.  Hyperlipidemia Uncontrolled; LDL improved from 121 to 87 and triglycerides increase from 187 to 328. Patient reports she was fasting prior to lab draw. Despite improvement, LDL remains above goal of <70 due to very high risk given diabetes + at least 1 additional major risk factor (advancing age, elevated LDL cholesterol, and hypertension) per 2020 AACE/ACE guidelines and TG above goal of <150 per 2020 AACE/ACE guidelines Consider adding ezetimibe 10 mg by mouth once daily to achieve LDL goal <70. Patient previously discussed benefits/risks with PCP and decided to defer pharmacotherapy. Would recommend further discussion regarding patient goals. Given increasing triglycerides, discussed potential addition of omega-3 fatty acids. Would prefer Vascepa (see REDUCE-IT trial); however, may be cost prohibitive. Patient plans to discuss with primary care provider tomorrow at  visit. May be reasonable to recommend addition of over the counter fish oil 2 capsules by mouth twice daily.   Subjective: [name] is an 88 y.o. year old female who is a primary patient of Luking, Elayne Snare, MD.  The CCM team was consulted for assistance with disease management and care coordination needs.    Engaged with patient by telephone for follow up visit in response to provider referral for pharmacy case management and/or care coordination services.   Consent to Services:  The patient was given information about Chronic Care Management services, agreed to services, and gave verbal consent prior to initiation of services.  Please see initial visit note for detailed documentation.   Patient Care Team: Kathyrn Drown, MD as PCP - General (Family Medicine) Josue Hector, MD as PCP - Cardiology (Cardiology) Beryle Lathe, [facility] (Pharmacist)  Objective:  Lab Results  Component Value Date   CREATININE 0.94 07/31/2021   CREATININE 0.95 03/27/2021   CREATININE 0.74 02/27/2021    Lab Results  Component Value Date   HGBA1C 7.0 (H) 07/31/2021   Last diabetic Eye exam:  Lab Results  Component Value Date/Time   HMDIABEYEEXA No Retinopathy 05/21/2021 12:00 AM    Last diabetic Foot exam: No results found for: HMDIABFOOTEX      Component Value Date/Time   CHOL 187 07/31/2021 0811   TRIG 328 (H) 07/31/2021 0811   HDL 46 07/31/2021 0811   CHOLHDL 4.1 07/31/2021 0811   CHOLHDL 3.3 07/05/2014 0907   VLDL 22 07/05/2014 0907   LDLCALC 87 07/31/2021 0811    Hepatic Function Latest Ref Rng & Units 07/31/2021 02/27/2021 02/26/2021  Total Protein 6.0 - 8.5 g/dL 6.8 6.4(L) 6.4(L)  Albumin 3.6 - 4.6 g/dL 4.6 3.5 3.3(L)  AST 0 - 40  IU/L 18 20 17   ALT 0 - 32 IU/L 23 15 14   Alk Phosphatase 44 - 121 IU/L 104 69 75  Total Bilirubin 0.0 - 1.2 mg/dL 0.8 0.3 0.6  Bilirubin, Direct 0.00 - 0.40 mg/dL 0.19 - -    Lab Results  Component Value Date/Time   TSH 1.950 12/31/2018  12:26 PM   TSH 1.650 03/11/2016 11:48 AM   FREET4 1.77 03/11/2016 11:48 AM    CBC Latest Ref Rng & Units 02/27/2021 02/26/2021 02/25/2021  WBC 4.0 - 10.5 K/uL 10.2 13.2(H) 18.3(H)  Hemoglobin 12.0 - 15.0 g/dL 11.2(L) 10.9(L) 12.7  Hematocrit 36.0 - 46.0 % 36.2 34.1(L) 38.2  Platelets 150 - 400 K/uL 206 148(L) 248    Lab Results  Component Value Date/Time   VD25OH 34.9 01/12/2019 12:00 PM   VD25OH 29.7 (L) 08/10/2018 12:42 PM    Clinical ASCVD: No  The ASCVD Risk score (Arnett DK, et al., 2019) failed to calculate for the following reasons:   The 2019 ASCVD risk score is only valid for ages 43 to 102    Social History   Tobacco Use  Smoking Status Never  Smokeless Tobacco Never   BP Readings from Last 3 Encounters:  04/03/21 134/76  03/07/21 124/76  02/27/21 (!) 151/53   Pulse Readings from Last 3 Encounters:  04/03/21 67  02/27/21 69  01/11/21 87   Wt Readings from Last 3 Encounters:  04/03/21 173 lb (78.5 kg)  02/25/21 185 lb 3 oz (84 kg)  01/11/21 168 lb (76.2 kg)    Assessment: Review of patient past medical history, allergies, medications, health status, including review of consultants reports, laboratory and other test data, was performed as part of comprehensive evaluation and provision of chronic care management services.   SDOH:  (Social Determinants of Health) assessments and interventions performed:    CCM Care Plan  Allergies  Allergen Reactions   Hydrocortisone Itching   Lisinopril     Other reaction(s): Lethargy (intolerance)   Phenergan [Promethazine Hcl] Other (See Comments)    hallucinations   Statins Other (See Comments)    Severe myalgias   Trazodone And Nefazodone Cough   Prednisone Rash    Medications Reviewed Today     Reviewed by Beryle Lathe, [facility] (Pharmacist) on 08/06/21 at 1120  Med List Status: <None>   Medication Order Taking? Sig Documenting Provider Last Dose Status Informant  ACETAMINOPHEN EXTRA STRENGTH 500 MG  tablet [id number] Yes TAKE (1) TABLET BY MOUTH EVERY (6) HOURS AS NEEDED FOR PAIN.**DO NOT EXCEED 8 TABLETS IN 24 HOURS**  Patient taking differently: 1,000 mg every 6 (six) hours as needed.   Kathyrn Drown, MD Taking Active   ALPRAZolam Duanne Moron) 0.25 MG tablet [id number] Yes TAKE 1 TABLET BY MOUTH TWICE A DAY. Kathyrn Drown, MD Taking Active   amLODipine (NORVASC) 10 MG tablet [id number] Yes Take 1 tablet (10 mg total) by mouth daily. Kathyrn Drown, MD Taking Active   beta carotene w/minerals (OCUVITE) tablet [id number] Yes TAKE 1 TABLET BY MOUTH AT BEDTIME. Kathyrn Drown, MD Taking Active Nursing Home Medication Administration Guide (MAG)  Carboxymethylcellulose Sodium (THERATEARS) 0.25 % SOLN [id number] Yes Apply 1 drop to eye 3 (three) times daily as needed (dry eyes). [provider] Taking Active Nursing Home Medication Administration Guide (MAG)  Cyanocobalamin (VITAMIN B-12) 2500 MCG SUBL [id number] Yes TAKE 1/2 TABLET (1250MCG) BY MOUTH AT BEDTIME. Elvia Collum M, DO Taking Active   dicyclomine (BENTYL) 10 MG capsule [id number] Yes  1 bid prn abd cramps and loose stool  Patient taking differently: Take 10 mg by mouth 2 (two) times daily as needed (Abdominal cramps and loose stools).   Kathyrn Drown, MD Taking Active   famotidine (PEPCID) 20 MG tablet [id number] Yes TAKE 1 TABLET BY MOUTH EACH MORNING. Lovena Le, Malena M, DO Taking Active   glucose blood (EASYMAX TEST) test strip [id number]  CHECK BLOOD SUGAR ONCE DAILY.(CALL MD IF BS BELOW 60: OR IF BS ABOVE 400) Luking, Scott A, MD  Active   hydrALAZINE (APRESOLINE) 25 MG tablet [id number] Yes Take 1 tablet (25 mg total) by mouth 2 (two) times daily. Kathyrn Drown, MD Taking Active   hydrOXYzine (ATARAX) 25 MG tablet [id number] Yes TAKE 1 TABLET BY MOUTH 3 TIMES A DAY AS NEEDED FOR ITCHING. Kathyrn Drown, MD Taking Active   losartan (COZAAR) 50 MG tablet [id number] Yes TAKE (1) TABLET BY MOUTH ONCE DAILY. Kathyrn Drown, MD Taking  Active   metFORMIN (GLUCOPHAGE) 500 MG tablet [id number] Yes Take one tablet 533m po in the morning and 1/2 tablet 250 mg po at supper LKathyrn Drown MD Taking Active   Olopatadine HCl 0.2 % SOLN [id number] Place 1 drop into both eyes daily. [provider] Taking Active Nursing Home Medication Administration Guide (MAG)  pantoprazole (PROTONIX) 20 MG tablet [id number] TAKE (1) TABLET BY MOUTH ONCE DAILY. LKathyrn Drown MD Taking Active   Vitamin D, Cholecalciferol, 10 MCG (400 UNIT) TABS [id number] TAKE 1 TABLET BY MOUTH ONCE DAILY. LKathyrn Drown MD Taking Active Nursing Home Medication Administration Guide ([facility]  Med List Note ([address] CPhT 04/03/20 [address]: [place]([phone]           Patient Active Problem List   Diagnosis Date Noted   Exposure to communicable disease 04/23/2021   Acute respiratory failure with hypoxia ([place] 02/26/2021   CAP (community acquired pneumonia) 02/25/2021   COVID-19 virus infection 02/25/2021   Myalgia due to statin 02/15/2020   Cognitive dysfunction 03/04/2019   Generalized anxiety disorder 06/05/2018   Floaters in visual field, bilateral 03/23/2018   Living in assisted living 03/23/2018   Vomiting and diarrhea 03/08/2018   Dehydration 03/08/2018   Hyponatremia 03/08/2018   C. difficile diarrhea 03/03/2018   Acute lower UTI 03/03/2018   Osteoarthritis of right knee 12/02/2017   Closed fracture of left orbital floor with routine healing 01/17/2017   Hyperlipidemia associated with type 2 diabetes mellitus ([place] 10/02/2016   Pedal edema 10/02/2016   Insomnia 10/02/2016   SBO (small bowel obstruction) ([place] 07/02/2016   Dyspnea on exertion 02/24/2016   Other fatigue    E-coli UTI 06/18/2015   Leukocytosis 12/16/2014   Nausea and vomiting 12/15/2014   HLD (hyperlipidemia) 12/15/2014   Essential hypertension 12/15/2014   Controlled type 2 diabetes mellitus without complication, without  long-term current use of insulin ([place] 02/15/2011    Immunization History  Administered Date(s) Administered   Fluad Quad(high Dose 65+) 04/04/2020, 04/03/2021   Influenza, High Dose Seasonal PF 03/09/2018   Influenza,inj,Quad PF,6+ Mos 03/28/2014, 03/23/2019   Influenza-Unspecified 03/12/2013, 04/16/2016, 04/03/2017, 04/04/2017   Moderna Sars-Covid-2 Vaccination 07/07/2019, 07/28/2019, 08/11/2020   PPD Test 02/09/2019   Pneumococcal Conjugate-13 03/28/2014   Pneumococcal Polysaccharide-23 07/01/2006, 03/23/2019   Tdap 05/01/2011, 01/05/2017   Zoster Recombinat (Shingrix) 04/17/2017, 06/17/2017   Zoster, Live 10/29/2007    Conditions to be addressed/monitored: HTN, HLD, and DMII  Care Plan : Medication Management  Updates  made by Beryle Lathe, [place] since 08/06/2021 12:00 AM     Problem: HTN, DM2, HLD   Priority: High  Onset Date: 02/23/2021     Long-Range Goal: Disease Progression Prevention   Start Date: 02/23/2021  Expected End Date: 05/24/2021  Recent Progress: On track  Priority: High  Note:   Current Barriers:  Unable to independently monitor therapeutic efficacy Unable to achieve control of hyperlipidemia  Pharmacist Clinical Goal(s):  Through collaboration with PharmD and provider, patient will: Achieve control of hyperlipidemia as evidenced by improved LDL and improved triglycerides   Interventions: 1:1 collaboration with Kathyrn Drown, MD regarding development and update of comprehensive plan of care as evidenced by provider attestation and co-signature Inter-disciplinary care team collaboration (see longitudinal plan of care) Comprehensive medication review performed; medication list updated in electronic medical record  Type 2 Diabetes - Goal on Track (progressing): YES.: Controlled; Most recent A1c increased from 6.9% to 7.0% which remains at goal per ADA guidelines Current medications: metformin 500 mg by mouth every morning and 250 mg by mouth   Intolerances: none Taking medications as directed: yes; adminitered by [facility] assisted living staff Side effects thought to be attributed to current medication regimen: no Denies hypoglycemic/hyperglycemic symptoms Hypoglycemia prevention: not indicated at this time Current meal patterns: breakfast: eggs, bacon, pancakes, waffles, oatmeal, and AVOIDS BREAD ; lunch: meat and two vegetables with fruit and occasionally cornbread; dinner:  meat and two vegetables with salad ; snacks: graham crackers, yogurt, wafers or half of a sandwhich with wheat bread; drinks: water or Gatorade Zero/BodyArmour (only a few sips per day) Current exercise:  walks at least 1 mile every morning On a statin: []  Yes  [x]  No, intolerant (severe myalgias)    Last microalbumin/creatinine ratio: 192 (07/31/21); on an ACEi/ARB: [x]  Yes  []  No    Last eye exam: completed within last year Last foot exam: sees Dr. Caprice Beaver every 4 months Pneumonia vaccine: up to date/completed Current glucose readings: patient verbally reports fasting blood glucose 120-140s (mostly within goal range of 80-130 mg/dL per ADA guidelines) Continue metformin 500 mg by mouth every morning and 250 mg every evening  Patient may be an ideal candidate for SGLT-2 inhibitor therapy to improve blood glucose. Patient continues to have increasing albuminuria despite angiotensin receptor blocker. Discussed with patient today and patient would like to have risk/benefit discussion with primary care provider at appointment tomorrow. Patient plans to call her insurance agent to discuss cost estimate. She does not think she will qualify for medication assistance programs. Patient's fasting blood glucose is monitored once daily by [facility] staff  Hypertension - Condition stable. Not addressed this visit.: Blood pressure under good control. Blood pressure is at goal of <140/90 mmHg per 2022 AAFP guidelines. Current medications: losartan 50 mg by mouth once daily,  amlodipine 10 mg by mouth once daily, and hydralazine 25 mg by mouth every 12 hours Intolerances:  lisinopril (lethargy) Taking medications as directed: yes, adminitered by [facility] assisted [facility] staff Side effects thought to be attributed to current medication regimen: no Denies dizziness, lightheadedness, blurred vision, and headache Home blood pressure readings: not discussed today. [facility] assisted living checks periodically (usually Mon/Wed/Fri) Continue losartan 50 mg by mouth once daily, amlodipine 10 mg by mouth once daily, and hydralazine 25 mg by mouth every 12 hours Recommend home blood pressure monitoring to discuss at next visit Check blood pressure in office tomorrow at primary care provider visit  Hyperlipidemia - Goal on Track (progressing): YES.: Uncontrolled; LDL  improved from 121 to 87 and triglycerides increase from 187 to 328. Patient reports she was fasting prior to lab draw. Despite improvement, LDL remains above goal of <70 due to very high risk given diabetes + at least 1 additional major risk factor (advancing age, elevated LDL cholesterol, and hypertension) per 2020 AACE/ACE guidelines and TG above goal of <150 per 2020 AACE/ACE guidelines Current medications: none; last treated with gemfibrozil >5 years ago Intolerances: statins (severe myalgias) - unsure which statins  Taking medications as directed: n/a Side effects thought to be attributed to current medication regimen: n/a Encourage dietary reduction of high fat containing foods such as butter, nuts, bacon, egg yolks, etc. Reviewed risks of hyperlipidemia, principles of treatment and consequences of untreated hyperlipidemia Statin intolerance noted. No statin due to serious side effects (ex. Myalgias with at least 2 different statins) Consider adding ezetimibe 10 mg by mouth once daily to achieve LDL goal <70. Patient previously discussed benefits/risks with PCP and decided to defer pharmacotherapy. Would  recommend further discussion regarding patient goals. Given increasing triglycerides, discussed potential addition of omega-3 fatty acids. Would prefer Vascepa (see REDUCE-IT trial); however, may be cost prohibitive. Patient plans to discuss with primary care provider tomorrow at visit. May be reasonable to recommend addition of over the counter fish oil 2 capsules by mouth twice daily.   Patient Goals/Self-Care Activities Over the next 90 days, patient will:  Take medications as prescribed Check blood sugar once a day at the following times: fasting (at least 8 hours since last food consumption), document, and provide at future appointments Check blood pressure a few times per week, document, and provide at future appointments  Follow Up Plan: Telephone follow up appointment with care management team member scheduled for: 10/29/21      Medication Assistance: None required.  Patient affirms current coverage meets needs.  Patient's preferred pharmacy is:  [facility], [place] [facility] [address] [place] [address] Phone: [phone] Fax: [phone]  Follow Up:  Patient agrees to Care Plan and Follow-up.  Plan: Telephone follow up appointment with care management team member scheduled for:  10/29/21  Kennon Holter, PharmD, BCACP, CPP Clinical Pharmacist Practitioner [place] [phone]

## 2021-08-06 NOTE — Patient Instructions (Signed)
[name],  It was great to talk to you today!  Please call me with any questions or concerns.  Visit Information  Following are the goals we discussed today:   Goals Addressed             This Visit's Progress    Medication Management       Patient Goals/Self-Care Activities Over the next 90  days, patient will:  Take medications as prescribed Check blood sugar once a day at the following times: fasting (at least 8 hours since last food consumption), document, and provide at future appointments Check blood pressure a few times per week, document, and provide at future appointments              Follow-up plan: Telephone follow up appointment with care management team member scheduled for:  10/29/21 Future Appointments  Date Time Provider [place]  08/07/2021  1:10 PM Kathyrn Drown, MD [facility]  10/29/2021 11:15 AM [facility] PHARMACIST [facility] [place]    The patient verbalized understanding of instructions, educational materials, and care plan provided today and agreed to receive a mailed copy of patient instructions, educational materials, and care plan.   Please call the care guide team at [phone] if you need to cancel or reschedule your appointment.   Kennon Holter, PharmD, [place], CPP Clinical Pharmacist Practitioner [place] [phone]

## 2021-08-07 ENCOUNTER — Ambulatory Visit (INDEPENDENT_AMBULATORY_CARE_PROVIDER_SITE_OTHER): Payer: Medicare Other | Admitting: Family Medicine

## 2021-08-07 ENCOUNTER — Other Ambulatory Visit: Payer: Self-pay

## 2021-08-07 VITALS — BP 136/72 | Ht 68.0 in | Wt 178.4 lb

## 2021-08-07 DIAGNOSIS — E1169 Type 2 diabetes mellitus with other specified complication: Secondary | ICD-10-CM | POA: Diagnosis not present

## 2021-08-07 DIAGNOSIS — I10 Essential (primary) hypertension: Secondary | ICD-10-CM | POA: Diagnosis not present

## 2021-08-07 DIAGNOSIS — E785 Hyperlipidemia, unspecified: Secondary | ICD-10-CM

## 2021-08-07 DIAGNOSIS — E119 Type 2 diabetes mellitus without complications: Secondary | ICD-10-CM

## 2021-08-07 MED ORDER — FISH OIL 1000 MG PO CAPS: ORAL_CAPSULE | ORAL | 12 refills | Status: DC

## 2021-08-07 NOTE — Progress Notes (Addendum)
° °  Subjective:    Patient ID: [name], female    DOB: 29-Jan-1932, 88 y.o.   MRN: [id number]  Hyperlipidemia This is a chronic problem. The current episode started more than 1 year ago. Risk factors for coronary artery disease include diabetes mellitus, dyslipidemia, hypertension and post-menopausal.  This patient stays at assisted living She is not able to fix her own food or prep her own food She needs assistance with bathing She can get dressed Occasionally needs assistance with bathroom In addition to this she does have a fall risk She should not be driving She also needs help with medications Patient had previously been diagnosed with cognitive dysfunction/mild dementia this is been stable.  But she still needs help with her day-to-day living.  She would not be able to live on her own.  She benefits from having nursing assistance to help her with her medicines.  She benefits from having assistance helping her with meal prep and with bathing.  And in some cases helping her with bathroom duties.  May also help her with fixing her food as well. Discuss recent labs. Results for orders placed or performed in visit on 06/04/21  HM DIABETES EYE EXAM  Result Value Ref Range   HM Diabetic Eye Exam No Retinopathy No Retinopathy  Renal function shows creatinine 0.94 but GFR at 59 Micro creatinine ratio has gone up A1c steady at 7.0 Lipid panel surprisingly good triglycerides mildly up 328 Liver function look good Review of Systems     Objective:   Physical Exam Lungs clear heart regular pulse normal BP good       Assessment & Plan:  We did discuss SGLT2 patient currently right now states she cannot afford this and does not want to go on these medicines We will monitor accordingly  Hyperlipidemia she does agree to go on fish oil OTC 1000 mg twice daily She will look into the cost of Zetia then she will let us know  Diabetes overall decent control continue current measures she  will follow-up if any progressive troubles or problems  Diabetic foot exam shows bunions bilateral.  No neuropathy.  Repeat labs in 4 months repeat office visit 4 months  Very important for the patient to continue rest home it benefits her.  To help her out with ADLs.  She would not be able to stay at home by herself under her current health issues

## 2021-08-08 ENCOUNTER — Telehealth: Payer: Self-pay | Admitting: Family Medicine

## 2021-08-08 MED ORDER — EZETIMIBE 10 MG PO TABS: ORAL_TABLET | ORAL | 12 refills | Status: DC

## 2021-08-08 NOTE — Telephone Encounter (Signed)
Patient informed that prescription was sent to the pharmacy. Verbalized understanding

## 2021-08-08 NOTE — Telephone Encounter (Signed)
Zetia 10 mg 1 qd for 30 d with 12 rf

## 2021-08-08 NOTE — Telephone Encounter (Signed)
Pt called re Ecetimibe 10 mg a time a day. She discussed this with Dr Nicki Reaper yesterday and he told her to call if she decided she wanted that medication. Please send rx to [facility].   [phone]

## 2021-08-08 NOTE — Telephone Encounter (Signed)
Please advise. Thank you

## 2021-08-13 ENCOUNTER — Telehealth: Payer: Self-pay | Admitting: Family Medicine

## 2021-08-13 NOTE — Telephone Encounter (Signed)
Patient notified and verbalized understanding. 

## 2021-08-13 NOTE — Telephone Encounter (Signed)
So noted.  Stay off of Zetia.  We can discuss further options the next time she comes in.  No additional medicines at this time

## 2021-08-13 NOTE — Telephone Encounter (Signed)
Pt states she took generic Zetia Dr Nicki Reaper prescribed and it made her feet feel like they were burning terribly bad. She said she cannot take that medication.   [phone]

## 2021-08-14 ENCOUNTER — Telehealth: Payer: Self-pay

## 2021-08-14 NOTE — Telephone Encounter (Signed)
D/C orders requested for ezetimibe 10 mg tab to be faxed to [facility] longterm care  To [facility] fax # [phone]

## 2021-08-17 ENCOUNTER — Telehealth: Payer: Self-pay | Admitting: Family Medicine

## 2021-08-17 NOTE — Telephone Encounter (Signed)
[name] from [facility] called asking about a fax that she sent in July 09, 2021 about an attending or treating physicians statement for the appt in August 07, 2021.  CB# [phone]

## 2021-08-24 ENCOUNTER — Other Ambulatory Visit: Payer: Self-pay | Admitting: Family Medicine

## 2021-08-28 ENCOUNTER — Ambulatory Visit (INDEPENDENT_AMBULATORY_CARE_PROVIDER_SITE_OTHER): Payer: Medicare Other

## 2021-08-28 ENCOUNTER — Other Ambulatory Visit: Payer: Self-pay

## 2021-08-28 VITALS — Ht 68.0 in | Wt 176.0 lb

## 2021-08-28 DIAGNOSIS — E1169 Type 2 diabetes mellitus with other specified complication: Secondary | ICD-10-CM

## 2021-08-28 DIAGNOSIS — I10 Essential (primary) hypertension: Secondary | ICD-10-CM | POA: Diagnosis not present

## 2021-08-28 DIAGNOSIS — E785 Hyperlipidemia, unspecified: Secondary | ICD-10-CM | POA: Diagnosis not present

## 2021-08-28 DIAGNOSIS — E119 Type 2 diabetes mellitus without complications: Secondary | ICD-10-CM | POA: Diagnosis not present

## 2021-08-28 DIAGNOSIS — Z0000 Encounter for general adult medical examination without abnormal findings: Secondary | ICD-10-CM | POA: Diagnosis not present

## 2021-08-28 NOTE — Progress Notes (Signed)
Subjective:   [name] is a 88 y.o. female who presents for an Initial Medicare Annual Wellness Visit. Virtual Visit via Telephone Note  I connected with  [name] on 08/28/21 at  9:40 AM EST by telephone and verified that I am speaking with the correct person using two identifiers.  Location: Patient: HOME Provider: [facility] Persons participating in the virtual visit: patient/Nurse Health Advisor   I discussed the limitations, risks, security and privacy concerns of performing an evaluation and management service by telephone and the availability of in person appointments. The patient expressed understanding and agreed to proceed.  Interactive audio and video telecommunications were attempted between this nurse and patient, however failed, due to patient having technical difficulties OR patient did not have access to video capability.  We continued and completed visit with audio only.  Some vital signs may be absent or patient reported.   Chriss Driver, LPN  Review of Systems     Cardiac Risk Factors include: advanced age (>69men, >73 women);diabetes mellitus;hypertension;dyslipidemia;sedentary lifestyle     Objective:    Today's Vitals   08/28/21 0942  Weight: 176 lb (79.8 kg)  Height: 5\' 8"  (1.727 m)   Body mass index is 26.76 kg/m.  Advanced Directives 08/28/2021 02/26/2021 02/25/2021 04/03/2020 04/02/2020 07/09/2019 05/27/2019  Does Patient Have a Medical Advance Directive? Yes Yes No Yes Yes Yes No  Type of Paramedic of [place];Living will Living will - Healthcare Power of [place];Living will [place];Living will -  Does patient want to make changes to medical advance directive? - No - Guardian declined - No - Patient declined No - Patient declined No - Patient declined -  Copy of [place] in Chart? Yes - validated most recent copy scanned in chart (See row  information) - - No - copy requested Yes - validated most recent copy scanned in chart (See row information) No - copy requested -  Would patient like information on creating a medical advance directive? - No - Patient declined No - Patient declined No - Guardian declined;No - Patient declined No - Patient declined - No - Patient declined    Current Medications (verified) Outpatient Encounter Medications as of 08/28/2021  Medication Sig   acetaminophen (ACETAMINOPHEN EXTRA STRENGTH) 500 MG tablet Take 1 tablet (500 mg total) by mouth every 6 (six) hours as needed.   ALPRAZolam (XANAX) 0.25 MG tablet TAKE 1 TABLET BY MOUTH TWICE A DAY.   amLODipine (NORVASC) 10 MG tablet Take 1 tablet (10 mg total) by mouth daily.   beta carotene w/minerals (OCUVITE) tablet TAKE 1 TABLET BY MOUTH AT BEDTIME.   Carboxymethylcellulose Sodium (THERATEARS) 0.25 % SOLN Apply 1 drop to eye 3 (three) times daily as needed (dry eyes).   Cyanocobalamin (VITAMIN B-12) 2500 MCG SUBL TAKE 1/2 TABLET (1250MCG) BY MOUTH AT BEDTIME.   dicyclomine (BENTYL) 10 MG capsule 1 bid prn abd cramps and loose stool (Patient taking differently: Take 10 mg by mouth 2 (two) times daily as needed (Abdominal cramps and loose stools).)   famotidine (PEPCID) 20 MG tablet TAKE 1 TABLET BY MOUTH EACH MORNING.   glucose blood (EASYMAX TEST) test strip CHECK BLOOD SUGAR ONCE DAILY.(CALL MD IF BS BELOW 60: OR IF BS ABOVE 400)   hydrALAZINE (APRESOLINE) 25 MG tablet Take 1 tablet (25 mg total) by mouth 2 (two) times daily.   hydrOXYzine (ATARAX) 25 MG tablet TAKE 1 TABLET BY  MOUTH 3 TIMES A DAY AS NEEDED FOR ITCHING.   losartan (COZAAR) 50 MG tablet TAKE (1) TABLET BY MOUTH ONCE DAILY.   metFORMIN (GLUCOPHAGE) 500 MG tablet Take one tablet 500mg  po in the morning and 1/2 tablet 250 mg po at supper   Olopatadine HCl 0.2 % SOLN Place 1 drop into both eyes daily.   Omega-3 Fatty Acids (FISH OIL) 1000 MG CAPS One capsule BID   pantoprazole (PROTONIX) 20  MG tablet TAKE (1) TABLET BY MOUTH ONCE DAILY.   Vitamin D, Cholecalciferol, 10 MCG (400 UNIT) TABS TAKE 1 TABLET BY MOUTH ONCE DAILY.   No facility-administered encounter medications on file as of 08/28/2021.    Allergies (verified) Hydrocortisone, Lisinopril, Phenergan [promethazine hcl], Statins, Trazodone and nefazodone, and Prednisone   History: Past Medical History:  Diagnosis Date   Cognitive dysfunction 03/04/2019   Patient scores 21 out of 30 on [place] cognitive assessment September 2020   Diabetes mellitus without complication ([place])    Diverticulitis    Frailty 03/04/2019   H/O bilateral breast reduction surgery    Hyperlipidemia    a. intolerant to statins.    Hypertension    Past Surgical History:  Procedure Laterality Date   ABDOMINAL HYSTERECTOMY     APPENDECTOMY     BREAST REDUCTION SURGERY     COLON SURGERY Left    Hemicolectomy due to diverticulitis   EYE SURGERY  03/30/2009   cataract   KNEE SURGERY     LAPAROSCOPIC INCISIONAL / UMBILICAL / VENTRAL HERNIA REPAIR  02/23/2007    REDUCTION MAMMAPLASTY Bilateral 2001   REFRACTIVE SURGERY     Family History  Problem Relation Age of Onset   Hypertension Father        kidney   Stroke Father    Cancer Father    Cancer Mother        ovaian   Diabetes Brother    Hypertension Sister    Social History   Socioeconomic History   Marital status: Widowed    Spouse name: Not on file   Number of children: Not on file   Years of education: Not on file   Highest education level: Not on file  Occupational History   Not on file  Tobacco Use   Smoking status: Never   Smokeless tobacco: Never  Vaping Use   Vaping Use: Never used  Substance and Sexual Activity   Alcohol use: No   Drug use: No   Sexual activity: Never    Birth control/protection: Abstinence  Other Topics Concern   Not on file  Social History Narrative   Not on file   Social Determinants of Health   Financial Resource Strain: Low Risk     Difficulty of Paying Living Expenses: Not hard at all  Food Insecurity: No Food Insecurity   Worried About Charity fundraiser in the Last Year: Never true   [place] in the Last Year: Never true  Transportation Needs: No Transportation Needs   Lack of Transportation (Medical): No   Lack of Transportation (Non-Medical): No  Physical Activity: Sufficiently Active   Days of Exercise per Week: 5 days   Minutes of Exercise per Session: 30 min  Stress: No Stress Concern Present   Feeling of Stress : Not at all  Social Connections: Moderately Integrated   Frequency of Communication with Friends and Family: More than three times a week   Frequency of Social Gatherings with Friends and Family: More than three  times a week   Attends Religious Services: More than 4 times per year   Active Member of Clubs or Organizations: Yes   Attends Archivist Meetings: More than 4 times per year   Marital Status: Widowed    Tobacco Counseling Counseling given: Not Answered   Clinical Intake:  Pre-visit preparation completed: Yes  Pain : No/denies pain     BMI - recorded: 26.76 Nutritional Status: BMI 25 -29 Overweight Nutritional Risks: None Diabetes: Yes  How often do you need to have someone help you when you read instructions, pamphlets, or other written materials from your doctor or pharmacy?: 1 - Never  Diabetic?Nutrition Risk Assessment:  Has the patient had any N/V/D within the last 2 months?  No  Does the patient have any non-healing wounds?  No  Has the patient had any unintentional weight loss or weight gain?  No   Diabetes:  Is the patient diabetic?  Yes  If diabetic, was a CBG obtained today?  No  Did the patient bring in their glucometer from home?  No  Phone visit.  How often do you monitor your CBG's? Daily.   Financial Strains and Diabetes Management:  Are you having any financial strains with the device, your supplies or your medication? No .  Does  the patient want to be seen by Chronic Care Management for management of their diabetes?  No  Would the patient like to be referred to a Nutritionist or for Diabetic Management?  No   Diabetic Exams:  Diabetic Eye Exam: Completed 09/2020. Pt has been advised about the importance in completing this exam Diabetic Foot Exam: Completed 08/2020. Pt has been advised about the importance in completing this exam. Pt is scheduled for diabetic foot exam on 08/2021.    Interpreter Needed?: No      Activities of Daily Living In your present state of health, do you have any difficulty performing the following activities: 08/28/2021 02/26/2021  Hearing? N Y  Vision? N N  Difficulty concentrating or making decisions? N N  Comment Rembering names. -  Walking or climbing stairs? N N  Dressing or bathing? N N  Doing errands, shopping? N Y  Conservation officer, nature and eating ? N -  Using the Toilet? N -  In the past six months, have you accidently leaked urine? N -  Do you have problems with loss of bowel control? N -  Managing your Medications? N -  Managing your Finances? N -  Housekeeping or managing your Housekeeping? N -  Some recent data might be hidden    Patient Care Team: Kathyrn Drown, MD as PCP - General (Family Medicine) Josue Hector, MD as PCP - Cardiology (Cardiology) Beryle Lathe, [facility] (Pharmacist)  Indicate any recent Medical Services you may have received from other than [facility] providers in the past year (date may be approximate).     Assessment:   This is a routine wellness examination for [name].  Hearing/Vision screen Hearing Screening - Comments:: No hearing issues.  Vision Screening - Comments:: Glasses. 09/2021. Dr. Rosana Hoes in [place]  Dietary issues and exercise activities discussed: Current Exercise Habits: Home exercise routine, Type of exercise: walking, Time (Minutes): 30, Frequency (Times/Week): 5, Weekly Exercise (Minutes/Week): 150, Intensity: Mild, Exercise  limited by: cardiac condition(s);orthopedic condition(s)   Goals Addressed             This Visit's Progress    Exercise 3x per week (30 min per time)  Continue to exercise and stay healthy.       Depression Screen PHQ 2/9 Scores 08/28/2021 08/07/2021 04/03/2021 10/16/2020 02/15/2020 06/17/2018 05/07/2018  PHQ - 2 Score 0 0 0 0 0 4 0  PHQ- 9 Score - - - - - 18 -    Fall Risk Fall Risk  08/28/2021 08/07/2021 04/03/2021 10/16/2020 10/14/2019  Falls in the past year? 0 0 0 1 0  Number falls in past yr: 0 - 0 0 -  Injury with Fall? 0 - 0 0 -  Risk for fall due to : No Fall Risks - No Fall Risks - -  Follow up Falls prevention discussed Falls evaluation completed Falls evaluation completed Falls evaluation completed -    FALL RISK PREVENTION PERTAINING TO THE HOME:  Any stairs in or around the home? No  If so, are there any without handrails? No  Home free of loose throw rugs in walkways, pet beds, electrical cords, etc? Yes  Adequate lighting in your home to reduce risk of falls? Yes   ASSISTIVE DEVICES UTILIZED TO PREVENT FALLS:  Life alert? No  Use of a cane, walker or w/c? No  Grab bars in the bathroom? Yes  Shower chair or bench in shower? Yes  Elevated toilet seat or a handicapped toilet? Yes   TIMED UP AND GO:  Was the test performed? No .  Phone visit.   Cognitive Function:     6CIT Screen 08/28/2021  What Year? 0 points  What month? 0 points  What time? 0 points  Count back from 20 0 points  Months in reverse 4 points  Repeat phrase 0 points  Total Score 4    Immunizations Immunization History  Administered Date(s) Administered   Fluad Quad(high Dose 65+) 04/04/2020, 04/03/2021   Influenza, High Dose Seasonal PF 03/09/2018   Influenza,inj,Quad PF,6+ Mos 03/28/2014, 03/23/2019   Influenza-Unspecified 03/12/2013, 04/16/2016, 04/03/2017, 04/04/2017   Moderna Sars-Covid-2 Vaccination 07/07/2019, 07/28/2019, 08/11/2020   PPD Test 02/09/2019   Pneumococcal  Conjugate-13 03/28/2014   Pneumococcal Polysaccharide-23 07/01/2006, 03/23/2019   Tdap 05/01/2011, 01/05/2017   Zoster Recombinat (Shingrix) 04/17/2017, 06/17/2017   Zoster, Live 10/29/2007    TDAP status: Up to date  Flu Vaccine status: Up to date  Pneumococcal vaccine status: Up to date  Covid-19 vaccine status: Completed vaccines  Qualifies for Shingles Vaccine? Yes   Zostavax completed Yes   Shingrix Completed?: Yes  Screening Tests Health Maintenance  Topic Date Due   COVID-19 Vaccine (4 - Booster) 10/06/2020   FOOT EXAM  06/13/2021   HEMOGLOBIN A1C  01/28/2022   OPHTHALMOLOGY EXAM  05/21/2022   TETANUS/TDAP  01/06/2027   Pneumonia Vaccine 81+ Years old  Completed   INFLUENZA VACCINE  Completed   DEXA SCAN  Completed   Zoster Vaccines- Shingrix  Completed   HPV VACCINES  Aged Out    Health Maintenance  Health Maintenance Due  Topic Date Due   COVID-19 Vaccine (4 - Booster) 10/06/2020   FOOT EXAM  06/13/2021    Colorectal cancer screening: No longer required.   Mammogram status: No longer required due to age.  Bone Density status: Completed 10/07/2016. Results reflect: Bone density results: OSTEOPENIA. Repeat every 2 years.  Lung Cancer Screening: (Low Dose CT Chest recommended if Age 20-80 years, 30 pack-year currently smoking OR have quit w/in 15years.) does not qualify.   Additional Screening:  Hepatitis C Screening: does not qualify;   Vision Screening: Recommended annual ophthalmology exams for early detection of glaucoma and  other disorders of the eye. Is the patient up to date with their annual eye exam?  Yes  Who is the provider or what is the name of the office in which the patient attends annual eye exams? Dr. Rosana Hoes in [place] If pt is not established with a provider, would they like to be referred to a provider to establish care? No .   Dental Screening: Recommended annual dental exams for proper oral hygiene  Community Resource Referral /  Chronic Care Management: CRR required this visit?  No   CCM required this visit?  No      Plan:     I have personally reviewed and noted the following in the patients chart:   Medical and social history Use of alcohol, tobacco or illicit drugs  Current medications and supplements including opioid prescriptions. Patient is not currently taking opioid prescriptions. Functional ability and status Nutritional status Physical activity Advanced directives List of other physicians Hospitalizations, surgeries, and ER visits in previous 12 months Vitals Screenings to include cognitive, depression, and falls Referrals and appointments  In addition, I have reviewed and discussed with patient certain preventive protocols, quality metrics, and best practice recommendations. A written personalized care plan for preventive services as well as general preventive health recommendations were provided to patient.     Chriss Driver, LPN   1/[id number]/4503   Nurse Notes: Pt is up to date on all age appropriate health maintenance and vaccines.

## 2021-08-28 NOTE — Patient Instructions (Signed)
[name] , Thank you for taking time to come for your Medicare Wellness Visit. I appreciate your ongoing commitment to your health goals. Please review the following plan we discussed and let me know if I can assist you in the future.   Screening recommendations/referrals: Colonoscopy: No longer required due to age.  Mammogram: No longer required due to age.   Bone Density: Done 10/07/2016  Recommended yearly ophthalmology/optometry visit for glaucoma screening and checkup Recommended yearly dental visit for hygiene and checkup  Vaccinations: Influenza vaccine: Done 04/03/2021 Repeat annually  Pneumococcal vaccine: Done 07/01/2006, 03/28/2014 and 03/23/2019 Tdap vaccine: Done 01/05/2017 Repeat in 10 years  Shingles vaccine: Done 10/29/2007, 04/17/2017 and 06/17/2017   Covid-19:Done 07/07/2019, 07/28/2019, 08/11/2020.  Advanced directives: Copy in chart.  Conditions/risks identified: KEEP UP THE GOOD WORK!!  Next appointment: Follow up in one year for your annual wellness visit 2024.   Preventive Care 86 Years and Older, Female Preventive care refers to lifestyle choices and visits with your health care provider that can promote health and wellness. What does preventive care include? A yearly physical exam. This is also called an annual well check. Dental exams once or twice a year. Routine eye exams. Ask your health care provider how often you should have your eyes checked. Personal lifestyle choices, including: Daily care of your teeth and gums. Regular physical activity. Eating a healthy diet. Avoiding tobacco and drug use. Limiting alcohol use. Practicing safe sex. Taking low-dose aspirin every day. Taking vitamin and mineral supplements as recommended by your health care provider. What happens during an annual well check? The services and screenings done by your health care provider during your annual well check will depend on your age, overall health, lifestyle risk factors, and  family history of disease. Counseling  Your health care provider may ask you questions about your: Alcohol use. Tobacco use. Drug use. Emotional well-being. Home and relationship well-being. Sexual activity. Eating habits. History of falls. Memory and ability to understand (cognition). Work and work Statistician. Reproductive health. Screening  You may have the following tests or measurements: Height, weight, and BMI. Blood pressure. Lipid and cholesterol levels. These may be checked every 5 years, or more frequently if you are over 29 years old. Skin check. Lung cancer screening. You may have this screening every year starting at age 9 if you have a 30-pack-year history of smoking and currently smoke or have quit within the past 15 years. Fecal occult blood test (FOBT) of the stool. You may have this test every year starting at age 67. Flexible sigmoidoscopy or colonoscopy. You may have a sigmoidoscopy every 5 years or a colonoscopy every 10 years starting at age 22. Hepatitis C blood test. Hepatitis B blood test. Sexually transmitted disease (STD) testing. Diabetes screening. This is done by checking your blood sugar (glucose) after you have not eaten for a while (fasting). You may have this done every 1-3 years. Bone density scan. This is done to screen for osteoporosis. You may have this done starting at age 53. Mammogram. This may be done every 1-2 years. Talk to your health care provider about how often you should have regular mammograms. Talk with your health care provider about your test results, treatment options, and if necessary, the need for more tests. Vaccines  Your health care provider may recommend certain vaccines, such as: Influenza vaccine. This is recommended every year. Tetanus, diphtheria, and acellular pertussis (Tdap, Td) vaccine. You may need a Td booster every 10 years. Zoster vaccine.  You may need this after age 73. Pneumococcal 13-valent conjugate  (PCV13) vaccine. One dose is recommended after age 52. Pneumococcal polysaccharide (PPSV23) vaccine. One dose is recommended after age 25. Talk to your health care provider about which screenings and vaccines you need and how often you need them. This information is not intended to replace advice given to you by your health care provider. Make sure you discuss any questions you have with your health care provider. Document Released: 07/14/2015 Document Revised: 03/06/2016 Document Reviewed: 04/18/2015 Elsevier Interactive Patient Education  2017 [place] Prevention in the Home Falls can cause injuries. They can happen to people of all ages. There are many things you can do to make your home safe and to help prevent falls. What can I do on the outside of my home? Regularly fix the edges of walkways and driveways and fix any cracks. Remove anything that might make you trip as you walk through a door, such as a raised step or threshold. Trim any bushes or trees on the path to your home. Use bright outdoor lighting. Clear any walking paths of anything that might make someone trip, such as rocks or tools. Regularly check to see if handrails are loose or broken. Make sure that both sides of any steps have handrails. Any raised decks and porches should have guardrails on the edges. Have any leaves, snow, or ice cleared regularly. Use sand or salt on walking paths during winter. Clean up any spills in your garage right away. This includes oil or grease spills. What can I do in the bathroom? Use night lights. Install grab bars by the toilet and in the tub and shower. Do not use towel bars as grab bars. Use non-skid mats or decals in the tub or shower. If you need to sit down in the shower, use a plastic, non-slip stool. Keep the floor dry. Clean up any water that spills on the floor as soon as it happens. Remove soap buildup in the tub or shower regularly. Attach bath mats securely with  double-sided non-slip rug tape. Do not have throw rugs and other things on the floor that can make you trip. What can I do in the bedroom? Use night lights. Make sure that you have a light by your bed that is easy to reach. Do not use any sheets or blankets that are too big for your bed. They should not hang down onto the floor. Have a firm chair that has side arms. You can use this for support while you get dressed. Do not have throw rugs and other things on the floor that can make you trip. What can I do in the kitchen? Clean up any spills right away. Avoid walking on wet floors. Keep items that you use a lot in easy-to-reach places. If you need to reach something above you, use a strong step stool that has a grab bar. Keep electrical cords out of the way. Do not use floor polish or wax that makes floors slippery. If you must use wax, use non-skid floor wax. Do not have throw rugs and other things on the floor that can make you trip. What can I do with my stairs? Do not leave any items on the stairs. Make sure that there are handrails on both sides of the stairs and use them. Fix handrails that are broken or loose. Make sure that handrails are as long as the stairways. Check any carpeting to make sure that it is  firmly attached to the stairs. Fix any carpet that is loose or worn. Avoid having throw rugs at the top or bottom of the stairs. If you do have throw rugs, attach them to the floor with carpet tape. Make sure that you have a light switch at the top of the stairs and the bottom of the stairs. If you do not have them, ask someone to add them for you. What else can I do to help prevent falls? Wear shoes that: Do not have high heels. Have rubber bottoms. Are comfortable and fit you well. Are closed at the toe. Do not wear sandals. If you use a stepladder: Make sure that it is fully opened. Do not climb a closed stepladder. Make sure that both sides of the stepladder are locked  into place. Ask someone to hold it for you, if possible. Clearly mark and make sure that you can see: Any grab bars or handrails. First and last steps. Where the edge of each step is. Use tools that help you move around (mobility aids) if they are needed. These include: Canes. Walkers. Scooters. Crutches. Turn on the lights when you go into a dark area. Replace any light bulbs as soon as they burn out. Set up your furniture so you have a clear path. Avoid moving your furniture around. If any of your floors are uneven, fix them. If there are any pets around you, be aware of where they are. Review your medicines with your doctor. Some medicines can make you feel dizzy. This can increase your chance of falling. Ask your doctor what other things that you can do to help prevent falls. This information is not intended to replace advice given to you by your health care provider. Make sure you discuss any questions you have with your health care provider. Document Released: 04/13/2009 Document Revised: 11/23/2015 Document Reviewed: 07/22/2014 Elsevier Interactive Patient Education  2017 [employer].

## 2021-08-31 ENCOUNTER — Other Ambulatory Visit: Payer: Self-pay | Admitting: Family Medicine

## 2021-09-04 ENCOUNTER — Telehealth: Payer: Self-pay

## 2021-09-04 DIAGNOSIS — L84 Corns and callosities: Secondary | ICD-10-CM | POA: Diagnosis not present

## 2021-09-04 DIAGNOSIS — E1142 Type 2 diabetes mellitus with diabetic polyneuropathy: Secondary | ICD-10-CM | POA: Diagnosis not present

## 2021-09-04 DIAGNOSIS — B351 Tinea unguium: Secondary | ICD-10-CM | POA: Diagnosis not present

## 2021-09-04 DIAGNOSIS — M79606 Pain in leg, unspecified: Secondary | ICD-10-CM | POA: Diagnosis not present

## 2021-09-04 NOTE — Telephone Encounter (Signed)
Pt called in stating that since taking this med ezetimibe (ZETIA) 10 MG tablet her feet has been burning and pt states that she can hardly walk. Pt wanted pcp to know about this reaction. Please advise. ? ?Cb#: [phone] ? ?

## 2021-09-04 NOTE — Telephone Encounter (Signed)
Pt contacted and verbalized understanding. Pt states if provider d/c zetia he was going to put her on Gabapentin. Please advise. Thank you ?[facility] ?

## 2021-09-04 NOTE — Telephone Encounter (Signed)
Should be noted that the Zetia was for cholesterol. ? ?Not quite sure where she is getting that we were going to start gabapentin?  For her cholesterol? ? ?Possibly she needs gabapentin for burning in her feet?  If so I would only recommend low-dose because it can cause drowsiness.  Please discuss with patient and see where she would like to go with this.  Thank you ?

## 2021-09-04 NOTE — Telephone Encounter (Signed)
Not familiar with that being a reaction to this medicine but nonetheless stop Zetia may remove it from her epic list ?

## 2021-09-05 NOTE — Telephone Encounter (Signed)
Noted thank you

## 2021-09-27 ENCOUNTER — Telehealth: Payer: Self-pay | Admitting: *Deleted

## 2021-09-27 DIAGNOSIS — M1711 Unilateral primary osteoarthritis, right knee: Secondary | ICD-10-CM | POA: Diagnosis not present

## 2021-09-27 NOTE — Chronic Care Management (AMB) (Signed)
?  Care Management  ? ?Note ? ?09/27/2021 ?Name: [name] MRN: [id number] DOB: [birth year] ? ?[name] is a 88 y.o. year old female who is a primary care patient of Luking, Elayne Snare, MD and is actively engaged with the care management team. I reached out to [name] by phone today to assist with canceling a follow up visit with the Pharmacist ? ?Follow up plan: ?If patient returns call to provider office, please advise to call Guadalupe Guerra at [phone]. ? ?[name]  ?Care Guide, Embedded Care Coordination ?[facility]  ?Direct Dial: [phone] ? ?

## 2021-10-02 DIAGNOSIS — L84 Corns and callosities: Secondary | ICD-10-CM | POA: Diagnosis not present

## 2021-10-02 DIAGNOSIS — E1142 Type 2 diabetes mellitus with diabetic polyneuropathy: Secondary | ICD-10-CM | POA: Diagnosis not present

## 2021-10-02 DIAGNOSIS — B351 Tinea unguium: Secondary | ICD-10-CM | POA: Diagnosis not present

## 2021-10-16 ENCOUNTER — Telehealth: Payer: Self-pay

## 2021-10-16 NOTE — Telephone Encounter (Signed)
Caller name:Sylvia (High Desha)  ? ?On DPR? :Yes ? ?Call back [phone] ? ?Provider they see: Wolfgang Phoenix   ? ?Reason for call:High Pauline Aus is needing a copy of lab work done on 07/31/21 faxed to [phone] ? ?

## 2021-10-22 ENCOUNTER — Other Ambulatory Visit: Payer: Self-pay | Admitting: Family Medicine

## 2021-10-29 ENCOUNTER — Other Ambulatory Visit: Payer: Self-pay | Admitting: Family Medicine

## 2021-10-29 ENCOUNTER — Telehealth: Payer: Medicare Other

## 2021-10-31 DIAGNOSIS — Z20822 Contact with and (suspected) exposure to covid-19: Secondary | ICD-10-CM | POA: Diagnosis not present

## 2021-11-01 DIAGNOSIS — M1711 Unilateral primary osteoarthritis, right knee: Secondary | ICD-10-CM | POA: Diagnosis not present

## 2021-11-08 DIAGNOSIS — M1711 Unilateral primary osteoarthritis, right knee: Secondary | ICD-10-CM | POA: Diagnosis not present

## 2021-11-20 DIAGNOSIS — H353114 Nonexudative age-related macular degeneration, right eye, advanced atrophic with subfoveal involvement: Secondary | ICD-10-CM | POA: Diagnosis not present

## 2021-11-20 LAB — HM DIABETES EYE EXAM

## 2021-11-27 DIAGNOSIS — E119 Type 2 diabetes mellitus without complications: Secondary | ICD-10-CM | POA: Diagnosis not present

## 2021-11-27 DIAGNOSIS — E1169 Type 2 diabetes mellitus with other specified complication: Secondary | ICD-10-CM | POA: Diagnosis not present

## 2021-11-27 DIAGNOSIS — E785 Hyperlipidemia, unspecified: Secondary | ICD-10-CM | POA: Diagnosis not present

## 2021-11-28 LAB — BASIC METABOLIC PANEL
BUN/Creatinine Ratio: 18 (ref 12–28)
BUN: 15 mg/dL (ref 8–27)
CO2: 26 mmol/L (ref 20–29)
Calcium: 9.9 mg/dL (ref 8.7–10.3)
Chloride: 96 mmol/L (ref 96–106)
Creatinine, Ser: 0.85 mg/dL (ref 0.57–1.00)
Glucose: 133 mg/dL — ABNORMAL HIGH (ref 70–99)
Potassium: 4.6 mmol/L (ref 3.5–5.2)
Sodium: 136 mmol/L (ref 134–144)
eGFR: 66 mL/min/{1.73_m2} (ref >59)

## 2021-11-28 LAB — LIPID PANEL
Chol/HDL Ratio: 4.6 ratio — ABNORMAL HIGH (ref 0.0–4.4)
Cholesterol, Total: 188 mg/dL (ref 100–199)
HDL: 41 mg/dL (ref >39)
LDL Chol Calc (NIH): 93 mg/dL (ref 0–99)
Triglycerides: 324 mg/dL — ABNORMAL HIGH (ref 0–149)
VLDL Cholesterol Cal: 54 mg/dL — ABNORMAL HIGH (ref 5–40)

## 2021-11-28 LAB — HEMOGLOBIN A1C
Est. average glucose Bld gHb Est-mCnc: 154 mg/dL
Hgb A1c MFr Bld: 7 % — ABNORMAL HIGH (ref 4.8–5.6)

## 2021-12-03 DIAGNOSIS — M1711 Unilateral primary osteoarthritis, right knee: Secondary | ICD-10-CM | POA: Diagnosis not present

## 2021-12-04 ENCOUNTER — Ambulatory Visit (INDEPENDENT_AMBULATORY_CARE_PROVIDER_SITE_OTHER): Payer: Medicare Other | Admitting: Family Medicine

## 2021-12-04 ENCOUNTER — Encounter: Payer: Self-pay | Admitting: Family Medicine

## 2021-12-04 VITALS — BP 148/78 | HR 67 | Temp 97.4°F | Wt 185.2 lb

## 2021-12-04 DIAGNOSIS — M1711 Unilateral primary osteoarthritis, right knee: Secondary | ICD-10-CM

## 2021-12-04 DIAGNOSIS — E785 Hyperlipidemia, unspecified: Secondary | ICD-10-CM | POA: Diagnosis not present

## 2021-12-04 DIAGNOSIS — I10 Essential (primary) hypertension: Secondary | ICD-10-CM | POA: Diagnosis not present

## 2021-12-04 DIAGNOSIS — E1169 Type 2 diabetes mellitus with other specified complication: Secondary | ICD-10-CM | POA: Diagnosis not present

## 2021-12-04 DIAGNOSIS — E119 Type 2 diabetes mellitus without complications: Secondary | ICD-10-CM

## 2021-12-04 DIAGNOSIS — Z01818 Encounter for other preprocedural examination: Secondary | ICD-10-CM

## 2021-12-04 MED ORDER — LORATADINE 10 MG PO TABS: 10.0000 mg | ORAL_TABLET | Freq: Every day | ORAL | 0 refills | Status: DC

## 2021-12-04 MED ORDER — LOSARTAN POTASSIUM 100 MG PO TABS: ORAL_TABLET | ORAL | 1 refills | Status: DC

## 2021-12-04 NOTE — Progress Notes (Signed)
   Subjective:    Patient ID: [name], female    DOB: [birth year], 88 y.o.   MRN: [id number]  HPI Pt following up on blood pressure. Pt states blood pressure has been good; pulse rate is high. Pt has clearance form for right knee replacement. Pt having allergy issues due to going to sister house that had lots of dust. Pt using hemorrhoid cream for hemorrhoid and is wanting to know if this is the best thing to use. Sugars have been elevated recently.  Patient states that she is trying her best to eat healthy Unable to do the walking that she normally does Denies any chest tightness pressure pain Denies any other major setbacks   Review of Systems     Objective:   Physical Exam General-in no acute distress Eyes-no discharge Lungs-respiratory rate normal, CTA CV-no murmurs,RRR Extremities skin warm dry no edema Neuro grossly normal Behavior normal, alert        Assessment & Plan:  1. Hyperlipidemia associated with type 2 diabetes mellitus ([name]) Healthy diet Patient does not tolerate statins  - Basic Metabolic Panel (BMET)  2. Essential hypertension Blood pressure subpar control bump up dose of losartan Recheck metabolic 7 in 3 weeks Recheck blood pressure in 3 to 4 weeks - losartan (COZAAR) 100 MG tablet; TAKE (1) TABLET BY MOUTH ONCE DAILY.  Dispense: 90 tablet; Refill: 1 - Basic Metabolic Panel (BMET)  3. Controlled type 2 diabetes mellitus without complication, without long-term current use of insulin (HCC) A1c is 7.0 glucose is slightly elevated because of patient's inability to be active for right now hold off on increasing metformin See how her glucoses are doing on her next visit   4. Primary osteoarthritis of right knee From a medical perspective I believe the patient can do her knee surgery She states that she does not have knee surgery she would be wheelchair-bound We do need to get cardiology clearance for this because of her diabetes and her age  82.  Preoperative clearance Cardiology clearance recommended - Ambulatory referral to Cardiology  We will see her back in 4 weeks to see how things are going with her sugars and blood pressure.  We have adjusted her medication.  We will also follow-up on her glucoses.  Hold on metformin at the current dosing.  May need to increase it depending on how she does we would need to take into account renal function

## 2021-12-04 NOTE — Patient Instructions (Signed)
Hi [name]  We are increasing your blood pressure medicine Losartan-new dose 100 mg a day Follow-up here in 3 to 4 weeks to recheck blood pressure We will have you repeat your kidney functions in about 3 weeks time to make sure you are getting along with the losartan  Loratadine 1 daily for allergies  We are in the process of setting you up with cardiology  If any problems or questions notify us [facility]-Dr. Nicki Reaper

## 2021-12-11 DIAGNOSIS — H15832 Staphyloma posticum, left eye: Secondary | ICD-10-CM | POA: Diagnosis not present

## 2021-12-11 DIAGNOSIS — H353114 Nonexudative age-related macular degeneration, right eye, advanced atrophic with subfoveal involvement: Secondary | ICD-10-CM | POA: Diagnosis not present

## 2021-12-11 DIAGNOSIS — H2522 Age-related cataract, morgagnian type, left eye: Secondary | ICD-10-CM | POA: Diagnosis not present

## 2021-12-11 DIAGNOSIS — H43811 Vitreous degeneration, right eye: Secondary | ICD-10-CM | POA: Diagnosis not present

## 2021-12-13 DIAGNOSIS — B351 Tinea unguium: Secondary | ICD-10-CM | POA: Diagnosis not present

## 2021-12-13 DIAGNOSIS — E1142 Type 2 diabetes mellitus with diabetic polyneuropathy: Secondary | ICD-10-CM | POA: Diagnosis not present

## 2021-12-13 DIAGNOSIS — L84 Corns and callosities: Secondary | ICD-10-CM | POA: Diagnosis not present

## 2021-12-20 ENCOUNTER — Telehealth: Payer: Self-pay

## 2021-12-20 NOTE — Telephone Encounter (Signed)
   Pre-operative Risk Assessment    Patient Name: [name]  DOB: 1931-12-22 MRN: [id number]      Request for Surgical Clearance    Procedure:   Right Total Knee Arthroplasty  Date of Surgery:  Clearance TBD                                 Surgeon:  Sydnee Cabal Surgeon's Group or Practice Name:  [facility] Phone number:  [phone] Fax number:  [phone]   Type of Clearance Requested:   - Medical    Type of Anesthesia:  Not Indicated   Additional requests/questions:  Please fax a copy of Last Office Note to the surgeon's office.  Signed, Elsie Lincoln Anjoli Diemer   12/20/2021, 11:21 AM

## 2021-12-20 NOTE — Telephone Encounter (Signed)
Spoke with patient who states that she would prefer to keep her scheduled time and date with Bernerd Pho, PA-C

## 2021-12-20 NOTE — Telephone Encounter (Signed)
Patient already has an appointment with Bernerd Pho, PA-C on 7/13 at 2:30 to be seen for pre-op clearance.

## 2021-12-20 NOTE — Telephone Encounter (Signed)
   Name: [name]  DOB: Mar 05, 1932  MRN: [id number]  Primary Cardiologist: Jenkins Rouge, MD  Chart reviewed as part of pre-operative protocol coverage. Because of [name] past medical history and time since last visit, she will require a follow-up in-office visit in order to better assess preoperative cardiovascular risk.  Pt hasn't been seen since 2020. Would place on Dr. Kyla Balzarine schedule.  Pre-op covering staff: - Please schedule appointment and call patient to inform them. If patient already had an upcoming appointment within acceptable timeframe, please add "pre-op clearance" to the appointment notes so provider is aware. - Please contact requesting surgeon's office via preferred method (i.e, phone, fax) to inform them of need for appointment prior to surgery.   Tami Lin Jahmire Ruffins, PA  12/20/2021, 11:44 AM

## 2021-12-21 ENCOUNTER — Other Ambulatory Visit: Payer: Self-pay | Admitting: Family Medicine

## 2021-12-21 DIAGNOSIS — I10 Essential (primary) hypertension: Secondary | ICD-10-CM

## 2021-12-25 DIAGNOSIS — E1169 Type 2 diabetes mellitus with other specified complication: Secondary | ICD-10-CM | POA: Diagnosis not present

## 2021-12-25 DIAGNOSIS — E785 Hyperlipidemia, unspecified: Secondary | ICD-10-CM | POA: Diagnosis not present

## 2021-12-25 DIAGNOSIS — I10 Essential (primary) hypertension: Secondary | ICD-10-CM | POA: Diagnosis not present

## 2021-12-26 LAB — BASIC METABOLIC PANEL
BUN/Creatinine Ratio: 19 (ref 12–28)
BUN: 17 mg/dL (ref 8–27)
CO2: 26 mmol/L (ref 20–29)
Calcium: 9.8 mg/dL (ref 8.7–10.3)
Chloride: 94 mmol/L — ABNORMAL LOW (ref 96–106)
Creatinine, Ser: 0.88 mg/dL (ref 0.57–1.00)
Glucose: 118 mg/dL — ABNORMAL HIGH (ref 70–99)
Potassium: 5.3 mmol/L — ABNORMAL HIGH (ref 3.5–5.2)
Sodium: 135 mmol/L (ref 134–144)
eGFR: 63 mL/min/{1.73_m2} (ref >59)

## 2021-12-27 ENCOUNTER — Other Ambulatory Visit: Payer: Self-pay | Admitting: Family Medicine

## 2021-12-27 ENCOUNTER — Telehealth: Payer: Self-pay | Admitting: Family Medicine

## 2021-12-27 NOTE — Telephone Encounter (Signed)
Spoke with Sunday Spillers at [facility] , states a written order will need to be faxed in for a more frequent blood sugar checks. She will also be faxing in the blood sugar record for review.

## 2021-12-27 NOTE — Telephone Encounter (Signed)
So glucose is slightly elevated but I would like to see more readings than just that 1 I would recommend checking glucose twice a day for the next 5 days send [place] some readings by Monday  Also recommend continuing healthy diet and good hydration If dizziness is not doing better by Monday we will need to see her

## 2021-12-27 NOTE — Telephone Encounter (Signed)
[name] from [facility] calling in to let us know that patients blood pressure and blood sugars have been elevated. This morning blood pressure was 144/72 and blood sugar was 150. Yesterday pt felt dizzy and was red, did not do much yesterday. Today pt is tired and fatigued but is up moving around. Please advise. Thank you.

## 2022-01-04 ENCOUNTER — Ambulatory Visit (INDEPENDENT_AMBULATORY_CARE_PROVIDER_SITE_OTHER): Payer: Medicare Other | Admitting: Family Medicine

## 2022-01-04 ENCOUNTER — Encounter: Payer: Self-pay | Admitting: Family Medicine

## 2022-01-04 VITALS — BP 136/60 | Wt 179.6 lb

## 2022-01-04 DIAGNOSIS — E119 Type 2 diabetes mellitus without complications: Secondary | ICD-10-CM | POA: Diagnosis not present

## 2022-01-04 DIAGNOSIS — M1711 Unilateral primary osteoarthritis, right knee: Secondary | ICD-10-CM | POA: Diagnosis not present

## 2022-01-04 NOTE — Progress Notes (Signed)
   Subjective:    Patient ID: [name], female    DOB: 13-May-1932, 88 y.o.   MRN: [id number]  HPI Pt arrives for follow up on sugars and blood pressure. Pt checking sugars daily. Pt sugar this morning was 148. Recent metabolic 7 reviewed Previous A1c 7.0 Patient is having severe knee pain limits her walking.  Unable to stand much.  She is typically a very active person but has been unable to do so over the past several months saw orthopedist who recommended to having a knee replacement but also recommended that she talk it over with [place] as well as her family she has an appointment with cardiology coming up soon  Pt states they are wanting to do knee surgery on her but pt is worried.    Review of Systems     Objective:   Physical Exam General-in no acute distress Eyes-no discharge Lungs-respiratory rate normal, CTA CV-no murmurs,RRR Extremities skin warm dry no edema Neuro grossly normal Behavior normal, alert        Assessment & Plan:   We did discuss the pros and cons of doing the knee replacement She is a very active person who desires to stay active in [place] willing has several more years of life ahead of her given that if she chooses not to do the surgery she will be ready much wheelchair or sedentary Doing the surgery does come with risk she understands this But we try to minimize those risks Medically she is doing well She does need to see cardiology to get cardiac presurgical evaluation From our point of view she is able to do surgery and she seems willing to do so currently  She is aware surgery does have risks and she will follow orthopedics advised to minimize risk of blood clots If she decides not to do the surgery I certainly understand but on the other side of the coin her quality of life will be lower without the surgery

## 2022-01-04 NOTE — Patient Instructions (Signed)
Hi [name]  It was good to see you today.  We will see what cardiology has to say.  From our perspective surgery is ago.  We will send notification to Dr. Theda Sers but cardiology does have to weigh in.  Please do lab work approximately 1 week before your follow-up visit in October  Let us know if there is anything you need before then Take[facility]-Dr. Nicki Reaper

## 2022-01-09 NOTE — Progress Notes (Unsigned)
Cardiology Office Note    Date:  01/10/2022   ID:  [name], DOB October 10, 1931, MRN [id number]  PCP:  Kathyrn Drown, MD  Cardiologist: Jenkins Rouge, MD    Chief Complaint  Patient presents with   Follow-up    Overdue Visit and Preop Clearance    History of Present Illness:    [name] is a 88 y.o. female with past medical history of HTN, HLD and Type II DM who presents to the office today for overdue follow-up and preoperative cardiac clearance.  She most recently had a telehealth visit with Dr. Johnsie Cancel in 05/2019 and was having more memory issues at that time but denied any recent anginal symptoms. She was continued on her current cardiac medications including Amlodipine 5 mg daily, HCTZ 25 mg daily and Losartan 100 mg daily.  The office was contacted in the interim in regard to cardiac clearance for right total knee arthroplasty and given the timeframe since her last office visit, a follow-up was arranged.  In talking with the patient and one of her caregivers from [facility], she reports her activity has been more limited secondary to right knee pain. She was previously walking over a mile a day for exercise but has not been able to do so within the past few months given her knee pain. Says she is trying to walk at least 700 feet a day for exercise. She denies any recent chest pain or dyspnea on exertion. No recent orthopnea, PND or pitting edema.   Past Medical History:  Diagnosis Date   Cognitive dysfunction 03/04/2019   Patient scores 21 out of 30 on [place] cognitive assessment September 2020   Diabetes mellitus without complication ([place])    Diverticulitis    Frailty 03/04/2019   H/O bilateral breast reduction surgery    Hyperlipidemia    a. intolerant to statins.    Hypertension     Past Surgical History:  Procedure Laterality Date   ABDOMINAL HYSTERECTOMY     APPENDECTOMY     BREAST REDUCTION SURGERY     COLON SURGERY Left    Hemicolectomy due to  diverticulitis   EYE SURGERY  03/30/2009   cataract   KNEE SURGERY     LAPAROSCOPIC INCISIONAL / UMBILICAL / VENTRAL HERNIA REPAIR  02/23/2007    REDUCTION MAMMAPLASTY Bilateral 2001   REFRACTIVE SURGERY      Current Medications: Outpatient Medications Prior to Visit  Medication Sig Dispense Refill   acetaminophen (ACETAMINOPHEN EXTRA STRENGTH) 500 MG tablet Take 1 tablet (500 mg total) by mouth every 6 (six) hours as needed. 120 tablet 5   ALPRAZolam (XANAX) 0.25 MG tablet TAKE 1 TABLET BY MOUTH TWICE A DAY. 60 tablet 5   amLODipine (NORVASC) 10 MG tablet Take 1 tablet (10 mg total) by mouth daily. 30 tablet 5   ANTI-DIARRHEAL 2 MG tablet TAKE 1 TABLET BY MOUTH 3 TIMES A DAY AS NEEDED FOR DIARRHEA. 24 tablet 0   beta carotene w/minerals (OCUVITE) tablet TAKE 1 TABLET BY MOUTH AT BEDTIME. 30 tablet 6   Carboxymethylcellulose Sodium (THERATEARS) 0.25 % SOLN Apply 1 drop to eye 3 (three) times daily as needed (dry eyes).     Cyanocobalamin (VITAMIN B-12) 2500 MCG SUBL TAKE 1/2 TABLET (1250MCG) BY MOUTH AT BEDTIME. 15 tablet 0   dicyclomine (BENTYL) 10 MG capsule 1 bid prn abd cramps and loose stool (Patient taking differently: Take 10 mg by mouth 2 (two) times daily as needed (Abdominal cramps  and loose stools).) 60 capsule 5   EASYMAX TEST test strip CHECK BLOOD SUGAR ONCE DAILY.(CALL MD IF BS BELOW 60: OR IF BS ABOVE 400) 50 strip 0   famotidine (PEPCID) 20 MG tablet TAKE 1 TABLET BY MOUTH EACH MORNING. 30 tablet 0   gabapentin (NEURONTIN) 100 MG capsule Take 100 mg by mouth 3 (three) times daily.     hydrALAZINE (APRESOLINE) 25 MG tablet Take 1 tablet (25 mg total) by mouth 2 (two) times daily. 60 tablet 5   hydrOXYzine (ATARAX) 25 MG tablet TAKE 1 TABLET BY MOUTH 3 TIMES A DAY AS NEEDED FOR ITCHING. 36 tablet 1   loratadine (CLARITIN) 10 MG tablet Take 1 tablet (10 mg total) by mouth daily. 30 tablet 0   losartan (COZAAR) 100 MG tablet TAKE (1) TABLET BY MOUTH ONCE DAILY. 90 tablet 0    metFORMIN (GLUCOPHAGE) 500 MG tablet Take one tablet '500mg'$  po in the morning and 1/2 tablet 250 mg po at supper 45 tablet 6   Olopatadine HCl 0.2 % SOLN Place 1 drop into both eyes daily.     Omega-3 Fatty Acids (FISH OIL) 1000 MG CAPS One capsule BID 60 capsule 12   pantoprazole (PROTONIX) 20 MG tablet TAKE (1) TABLET BY MOUTH ONCE DAILY. 30 tablet 5   Vitamin D, Cholecalciferol, 10 MCG (400 UNIT) TABS TAKE 1 TABLET BY MOUTH ONCE DAILY. 30 tablet 6   No facility-administered medications prior to visit.     Allergies:   Hydrocortisone, Lisinopril, Phenergan [promethazine hcl], Statins, Trazodone and nefazodone, and Prednisone   Social History   Socioeconomic History   Marital status: Widowed    Spouse name: Not on file   Number of children: Not on file   Years of education: Not on file   Highest education level: Not on file  Occupational History   Not on file  Tobacco Use   Smoking status: Never   Smokeless tobacco: Never  Vaping Use   Vaping Use: Never used  Substance and Sexual Activity   Alcohol use: No   Drug use: No   Sexual activity: Never    Birth control/protection: Abstinence  Other Topics Concern   Not on file  Social History Narrative   Not on file   Social Determinants of Health   Financial Resource Strain: Low Risk  (08/28/2021)   Overall Financial Resource Strain (CARDIA)    Difficulty of Paying Living Expenses: Not hard at all  Food Insecurity: No Food Insecurity (08/28/2021)   Hunger Vital Sign    Worried About Running Out of Food in the Last Year: Never true    Spur in the Last Year: Never true  Transportation Needs: No Transportation Needs (08/28/2021)   PRAPARE - Transportation    Lack of Transportation (Medical): No    Lack of Transportation (Non-Medical): No  Physical Activity: Sufficiently Active (08/28/2021)   Exercise Vital Sign    Days of Exercise per Week: 5 days    Minutes of Exercise per Session: 30 min  Stress: No Stress Concern  Present (08/28/2021)   [place]    Feeling of Stress : Not at all  Social Connections: Moderately Integrated (08/28/2021)   Social Connection and Isolation Panel [NHANES]    Frequency of Communication with Friends and Family: More than three times a week    Frequency of Social Gatherings with Friends and Family: More than three times a week    Attends  Religious Services: More than 4 times per year    Active Member of Clubs or Organizations: Yes    Attends Archivist Meetings: More than 4 times per year    Marital Status: Widowed     Family History:  The patient's family history includes Cancer in her father and mother; Diabetes in her brother; Hypertension in her father and sister; Stroke in her father.   Review of Systems:    Please see the history of present illness.     All other systems reviewed and are otherwise negative except as noted above.   Physical Exam:    VS:  BP (!) 142/78   Pulse 84   Ht '5\' 8"'$  (1.727 m)   Wt 181 lb 3.2 oz (82.2 kg)   SpO2 97%   BMI 27.55 kg/m    General: Pleasant elderly female appearing in no acute distress. Head: Normocephalic, atraumatic. Neck: No carotid bruits. JVD not elevated.  Lungs: Respirations regular and unlabored, without wheezes or rales.  Heart: Regular rate and rhythm. No S3 or S4.  No murmur, no rubs, or gallops appreciated. Abdomen: Appears non-distended. No obvious abdominal masses. Msk:  Strength and tone appear normal for age. No obvious joint deformities or effusions. Extremities: No clubbing or cyanosis. No pitting edema.  Distal pedal pulses are 2+ bilaterally. Neuro: Alert and oriented X 3. Moves all extremities spontaneously. No focal deficits noted. Psych:  Responds to questions appropriately with a normal affect. Skin: No rashes or lesions noted  Wt Readings from Last 3 Encounters:  01/10/22 181 lb 3.2 oz (82.2 kg)  01/04/22 179 lb 9.6 oz  (81.5 kg)  12/04/21 185 lb 3.2 oz (84 kg)     Studies/Labs Reviewed:   EKG:  EKG is ordered today. The ekg ordered today demonstrates NSR, HR 85 with old anterior infarct pattern. No acute ST changes when compared to prior tracings.   Recent Labs: 02/27/2021: Hemoglobin 11.2; Platelets 206 07/31/2021: ALT 23 12/25/2021: BUN 17; Creatinine, Ser 0.88; Potassium 5.3; Sodium 135   Lipid Panel    Component Value Date/Time   CHOL 188 11/27/2021 0828   TRIG 324 (H) 11/27/2021 0828   HDL 41 11/27/2021 0828   CHOLHDL 4.6 (H) 11/27/2021 0828   CHOLHDL 3.3 07/05/2014 0907   VLDL 22 07/05/2014 0907   LDLCALC 93 11/27/2021 0828    Additional studies/ records that were reviewed today include:   Echocardiogram: 01/2016 Study Conclusions   - Left ventricle: The cavity size was normal. Systolic function was    normal. The estimated ejection fraction was in the range of 55%    to 60%. Wall motion was normal; there were no regional wall    motion abnormalities. Doppler parameters are consistent with    abnormal left ventricular relaxation (grade 1 diastolic    dysfunction).  - Pericardium, extracardiac: A small pericardial effusion was    identified.   Assessment:    1. Preoperative cardiovascular examination   2. Essential hypertension   3. Palpitations   4. Hyperlipidemia associated with type 2 diabetes mellitus ([place])      Plan:   In order of problems listed above:  1. Preoperative Cardiac Clearance for Right Total Knee Replacement - She was previously very active at baseline for her age by walking over a mile a day but activity has been more limited over the past few months due to progressive knee pain. While she has cardiac risk factors, she does not have any known history of  CAD or CHF. EKG today is without acute ST changes. Given the timeframe since any prior evaluation, I did recommend a repeat echocardiogram for reassessment of any structural abnormalities. RCRI Risk is overall  low at 0.4% risk of a major cardiac event. If echocardiogram is reassuring, would not anticipate further cardiac testing prior to her upcoming surgery.  2. HTN - Her blood pressure was initially recorded at 160/74, rechecked and improved to 142/78.  She does report having known "white-coat hypertension" and says BP has been well controlled when checked at ALF. I encouraged them to continue to follow this. Will continue current medical therapy for now with Amlodipine 10 mg daily, Hydralazine 25 mg twice daily and Losartan 100 mg daily.  3. Palpitations - She denies any recent symptoms and is in normal sinus rhythm by EKG and examination today. Repeat echocardiogram pending.  4. HLD - Followed by her PCP. FLP in 10/2021 showed total cholesterol 188, triglycerides 324, HDL 41 and LDL 93.  She has been intolerant to statin therapy and remains on Fish Oil.    Medication Adjustments/Labs and Tests Ordered: Current medicines are reviewed at length with the patient today.  Concerns regarding medicines are outlined above.  Medication changes, Labs and Tests ordered today are listed in the Patient Instructions below. Patient Instructions  Medication Instructions:  Your physician recommends that you continue on your current medications as directed. Please refer to the Current Medication list given to you today.  *If you need a refill on your cardiac medications before your next appointment, please call your pharmacy*   Lab Work: NONE   If you have labs (blood work) drawn today and your tests are completely normal, you will receive your results only by: [place] (if you have MyChart) OR A paper copy in the mail If you have any lab test that is abnormal or we need to change your treatment, we will call you to review the results.   Testing/Procedures: Your physician has requested that you have an echocardiogram. Echocardiography is a painless test that uses sound waves to create images of  your heart. It provides your doctor with information about the size and shape of your heart and how well your heart's chambers and valves are working. This procedure takes approximately one hour. There are no restrictions for this procedure.    Follow-Up: At [facility], you and your health needs are our priority.  As part of our continuing mission to provide you with exceptional heart care, we have created designated Provider Care Teams.  These Care Teams include your primary Cardiologist (physician) and Advanced Practice Providers (APPs -  Physician Assistants and Nurse Practitioners) who all work together to provide you with the care you need, when you need it.  We recommend signing up for the patient portal called "MyChart".  Sign up information is provided on this After Visit Summary.  MyChart is used to connect with patients for Virtual Visits (Telemedicine).  Patients are able to view lab/test results, encounter notes, upcoming appointments, etc.  Non-urgent messages can be sent to your provider as well.   To learn more about what you can do with MyChart, go to [email or web address].    Your next appointment:   1 year(s)  The format for your next appointment:   In Person  Provider:   Jenkins Rouge, MD or Bernerd Pho, PA-C    Other Instructions Thank you for choosing [facility]!    Important Information About Sugar  Signed, Erma Heritage, PA-C  01/10/2022 4:50 PM    [place] [address] [place], [place] [address] Phone: [phone] Fax: [phone]

## 2022-01-10 ENCOUNTER — Ambulatory Visit (INDEPENDENT_AMBULATORY_CARE_PROVIDER_SITE_OTHER): Payer: Medicare Other | Admitting: Student

## 2022-01-10 ENCOUNTER — Encounter: Payer: Self-pay | Admitting: Student

## 2022-01-10 VITALS — BP 142/78 | HR 84 | Ht 68.0 in | Wt 181.2 lb

## 2022-01-10 DIAGNOSIS — E785 Hyperlipidemia, unspecified: Secondary | ICD-10-CM | POA: Diagnosis not present

## 2022-01-10 DIAGNOSIS — Z01810 Encounter for preprocedural cardiovascular examination: Secondary | ICD-10-CM | POA: Diagnosis not present

## 2022-01-10 DIAGNOSIS — E1169 Type 2 diabetes mellitus with other specified complication: Secondary | ICD-10-CM | POA: Diagnosis not present

## 2022-01-10 DIAGNOSIS — I10 Essential (primary) hypertension: Secondary | ICD-10-CM

## 2022-01-10 DIAGNOSIS — R002 Palpitations: Secondary | ICD-10-CM | POA: Diagnosis not present

## 2022-01-10 NOTE — Patient Instructions (Signed)
Medication Instructions:  Your physician recommends that you continue on your current medications as directed. Please refer to the Current Medication list given to you today.  *If you need a refill on your cardiac medications before your next appointment, please call your pharmacy*   Lab Work: NONE   If you have labs (blood work) drawn today and your tests are completely normal, you will receive your results only by: [place] (if you have MyChart) OR A paper copy in the mail If you have any lab test that is abnormal or we need to change your treatment, we will call you to review the results.   Testing/Procedures: Your physician has requested that you have an echocardiogram. Echocardiography is a painless test that uses sound waves to create images of your heart. It provides your doctor with information about the size and shape of your heart and how well your heart's chambers and valves are working. This procedure takes approximately one hour. There are no restrictions for this procedure.    Follow-Up: At [facility], you and your health needs are our priority.  As part of our continuing mission to provide you with exceptional heart care, we have created designated Provider Care Teams.  These Care Teams include your primary Cardiologist (physician) and Advanced Practice Providers (APPs -  Physician Assistants and Nurse Practitioners) who all work together to provide you with the care you need, when you need it.  We recommend signing up for the patient portal called "MyChart".  Sign up information is provided on this After Visit Summary.  MyChart is used to connect with patients for Virtual Visits (Telemedicine).  Patients are able to view lab/test results, encounter notes, upcoming appointments, etc.  Non-urgent messages can be sent to your provider as well.   To learn more about what you can do with MyChart, go to [email or web address].    Your next appointment:   1  year(s)  The format for your next appointment:   In Person  Provider:   Jenkins Rouge, MD or Bernerd Pho, PA-C    Other Instructions Thank you for choosing [facility]!    Important Information About [facility]

## 2022-01-23 ENCOUNTER — Ambulatory Visit (HOSPITAL_COMMUNITY)
Admission: RE | Admit: 2022-01-23 | Discharge: 2022-01-23 | Disposition: A | Discharge status: Home / Self Care | Payer: Medicare Other | Source: Ambulatory Visit | Attending: Student | Admitting: Student

## 2022-01-23 DIAGNOSIS — R002 Palpitations: Secondary | ICD-10-CM | POA: Insufficient documentation

## 2022-01-23 DIAGNOSIS — Z01810 Encounter for preprocedural cardiovascular examination: Secondary | ICD-10-CM | POA: Insufficient documentation

## 2022-01-23 LAB — ECHOCARDIOGRAM COMPLETE
AR max vel: 2.04 cm2
AV Area VTI: 2.18 cm2
AV Area mean vel: 2.08 cm2
AV Mean grad: 8 mmHg
AV Peak grad: 14.1 mmHg
Ao pk vel: 1.88 m/s
Area-P 1/2: 2.24 cm2
S' Lateral: 2 cm

## 2022-01-23 NOTE — Progress Notes (Signed)
*  PRELIMINARY RESULTS* Echocardiogram 2D Echocardiogram has been performed.  [name] 01/23/2022, 2:07 PM

## 2022-01-28 ENCOUNTER — Other Ambulatory Visit: Payer: Self-pay | Admitting: Family Medicine

## 2022-02-12 ENCOUNTER — Telehealth: Payer: Self-pay | Admitting: *Deleted

## 2022-02-12 NOTE — Telephone Encounter (Signed)
Patient woke up today with sniffles and cough- feeling tired and temp 99- Covid Test Negative Patient would like a cough med sent into [facility] and if she not feeling better in the morning she said she would call for an office visit

## 2022-02-12 NOTE — Telephone Encounter (Signed)
Thank you that sounds appropriate

## 2022-02-13 MED ORDER — BENZONATATE 100 MG PO CAPS: 100.0000 mg | ORAL_CAPSULE | Freq: Three times a day (TID) | ORAL | 0 refills | Status: DC | PRN

## 2022-02-13 NOTE — Telephone Encounter (Signed)
Please send in Tessalon 100 mg 1 taken 3 times daily as needed for cough, #15 The benefit of this medicine is that it does not cause drowsiness or other negative side effects with her age

## 2022-02-13 NOTE — Telephone Encounter (Signed)
Prescription sent electronically to [facility]. [facility] notified

## 2022-02-17 ENCOUNTER — Encounter (HOSPITAL_COMMUNITY): Payer: Self-pay | Admitting: *Deleted

## 2022-02-17 ENCOUNTER — Other Ambulatory Visit: Payer: Self-pay

## 2022-02-17 ENCOUNTER — Inpatient Hospital Stay (HOSPITAL_COMMUNITY)
Admission: EM | Admit: 2022-02-17 | Discharge: 2022-02-23 | DRG: 193 | Disposition: A | Discharge status: Skilled Nursing Facility | Payer: Medicare Other | Source: Skilled Nursing Facility | Attending: Internal Medicine | Admitting: Internal Medicine

## 2022-02-17 ENCOUNTER — Emergency Department (HOSPITAL_COMMUNITY): Payer: Medicare Other

## 2022-02-17 DIAGNOSIS — Z8249 Family history of ischemic heart disease and other diseases of the circulatory system: Secondary | ICD-10-CM | POA: Diagnosis not present

## 2022-02-17 DIAGNOSIS — E871 Hypo-osmolality and hyponatremia: Secondary | ICD-10-CM

## 2022-02-17 DIAGNOSIS — Z9981 Dependence on supplemental oxygen: Secondary | ICD-10-CM | POA: Diagnosis not present

## 2022-02-17 DIAGNOSIS — E1151 Type 2 diabetes mellitus with diabetic peripheral angiopathy without gangrene: Secondary | ICD-10-CM

## 2022-02-17 DIAGNOSIS — Z7984 Long term (current) use of oral hypoglycemic drugs: Secondary | ICD-10-CM | POA: Diagnosis not present

## 2022-02-17 DIAGNOSIS — R262 Difficulty in walking, not elsewhere classified: Secondary | ICD-10-CM | POA: Diagnosis not present

## 2022-02-17 DIAGNOSIS — Z66 Do not resuscitate: Secondary | ICD-10-CM | POA: Diagnosis present

## 2022-02-17 DIAGNOSIS — E1159 Type 2 diabetes mellitus with other circulatory complications: Secondary | ICD-10-CM

## 2022-02-17 DIAGNOSIS — G4709 Other insomnia: Secondary | ICD-10-CM

## 2022-02-17 DIAGNOSIS — L89151 Pressure ulcer of sacral region, stage 1: Secondary | ICD-10-CM | POA: Diagnosis present

## 2022-02-17 DIAGNOSIS — R0600 Dyspnea, unspecified: Secondary | ICD-10-CM | POA: Diagnosis not present

## 2022-02-17 DIAGNOSIS — K219 Gastro-esophageal reflux disease without esophagitis: Secondary | ICD-10-CM

## 2022-02-17 DIAGNOSIS — E119 Type 2 diabetes mellitus without complications: Secondary | ICD-10-CM | POA: Diagnosis not present

## 2022-02-17 DIAGNOSIS — E222 Syndrome of inappropriate secretion of antidiuretic hormone: Secondary | ICD-10-CM | POA: Diagnosis present

## 2022-02-17 DIAGNOSIS — J189 Pneumonia, unspecified organism: Secondary | ICD-10-CM | POA: Diagnosis not present

## 2022-02-17 DIAGNOSIS — Z888 Allergy status to other drugs, medicaments and biological substances status: Secondary | ICD-10-CM | POA: Diagnosis not present

## 2022-02-17 DIAGNOSIS — F0394 Unspecified dementia, unspecified severity, with anxiety: Secondary | ICD-10-CM | POA: Diagnosis present

## 2022-02-17 DIAGNOSIS — Y95 Nosocomial condition: Secondary | ICD-10-CM | POA: Diagnosis present

## 2022-02-17 DIAGNOSIS — R0602 Shortness of breath: Secondary | ICD-10-CM | POA: Diagnosis not present

## 2022-02-17 DIAGNOSIS — F411 Generalized anxiety disorder: Secondary | ICD-10-CM

## 2022-02-17 DIAGNOSIS — I10 Essential (primary) hypertension: Secondary | ICD-10-CM

## 2022-02-17 DIAGNOSIS — K649 Unspecified hemorrhoids: Secondary | ICD-10-CM | POA: Diagnosis not present

## 2022-02-17 DIAGNOSIS — I152 Hypertension secondary to endocrine disorders: Secondary | ICD-10-CM | POA: Diagnosis not present

## 2022-02-17 DIAGNOSIS — M6281 Muscle weakness (generalized): Secondary | ICD-10-CM | POA: Diagnosis not present

## 2022-02-17 DIAGNOSIS — Z20822 Contact with and (suspected) exposure to covid-19: Secondary | ICD-10-CM | POA: Diagnosis present

## 2022-02-17 DIAGNOSIS — Z9049 Acquired absence of other specified parts of digestive tract: Secondary | ICD-10-CM

## 2022-02-17 DIAGNOSIS — Z833 Family history of diabetes mellitus: Secondary | ICD-10-CM

## 2022-02-17 DIAGNOSIS — Z79899 Other long term (current) drug therapy: Secondary | ICD-10-CM | POA: Diagnosis not present

## 2022-02-17 DIAGNOSIS — Z741 Need for assistance with personal care: Secondary | ICD-10-CM | POA: Diagnosis not present

## 2022-02-17 DIAGNOSIS — J90 Pleural effusion, not elsewhere classified: Secondary | ICD-10-CM | POA: Diagnosis not present

## 2022-02-17 DIAGNOSIS — R498 Other voice and resonance disorders: Secondary | ICD-10-CM | POA: Diagnosis not present

## 2022-02-17 DIAGNOSIS — R41841 Cognitive communication deficit: Secondary | ICD-10-CM | POA: Diagnosis not present

## 2022-02-17 DIAGNOSIS — R488 Other symbolic dysfunctions: Secondary | ICD-10-CM | POA: Diagnosis not present

## 2022-02-17 DIAGNOSIS — F0390 Unspecified dementia without behavioral disturbance: Secondary | ICD-10-CM | POA: Diagnosis not present

## 2022-02-17 DIAGNOSIS — J9601 Acute respiratory failure with hypoxia: Secondary | ICD-10-CM

## 2022-02-17 DIAGNOSIS — R279 Unspecified lack of coordination: Secondary | ICD-10-CM | POA: Diagnosis not present

## 2022-02-17 DIAGNOSIS — E785 Hyperlipidemia, unspecified: Secondary | ICD-10-CM | POA: Diagnosis present

## 2022-02-17 DIAGNOSIS — R059 Cough, unspecified: Secondary | ICD-10-CM | POA: Diagnosis not present

## 2022-02-17 LAB — CBC WITH DIFFERENTIAL/PLATELET
Abs Immature Granulocytes: 0.09 10*3/uL — ABNORMAL HIGH (ref 0.00–0.07)
Basophils Absolute: 0 10*3/uL (ref 0.0–0.1)
Basophils Relative: 0 %
Eosinophils Absolute: 0.1 10*3/uL (ref 0.0–0.5)
Eosinophils Relative: 0 %
HCT: 34.8 % — ABNORMAL LOW (ref 36.0–46.0)
Hemoglobin: 11.6 g/dL — ABNORMAL LOW (ref 12.0–15.0)
Immature Granulocytes: 1 %
Lymphocytes Relative: 9 %
Lymphs Abs: 1.3 10*3/uL (ref 0.7–4.0)
MCH: 28.8 pg (ref 26.0–34.0)
MCHC: 33.3 g/dL (ref 30.0–36.0)
MCV: 86.4 fL (ref 80.0–100.0)
Monocytes Absolute: 1.2 10*3/uL — ABNORMAL HIGH (ref 0.1–1.0)
Monocytes Relative: 8 %
Neutro Abs: 12.4 10*3/uL — ABNORMAL HIGH (ref 1.7–7.7)
Neutrophils Relative %: 82 %
Platelets: 293 10*3/uL (ref 150–400)
RBC: 4.03 MIL/uL (ref 3.87–5.11)
RDW: 13.3 % (ref 11.5–15.5)
WBC: 15.1 10*3/uL — ABNORMAL HIGH (ref 4.0–10.5)
nRBC: 0 % (ref 0.0–0.2)

## 2022-02-17 LAB — BASIC METABOLIC PANEL
Anion gap: 10 (ref 5–15)
BUN: 12 mg/dL (ref 8–23)
CO2: 26 mmol/L (ref 22–32)
Calcium: 8.7 mg/dL — ABNORMAL LOW (ref 8.9–10.3)
Chloride: 87 mmol/L — ABNORMAL LOW (ref 98–111)
Creatinine, Ser: 0.72 mg/dL (ref 0.44–1.00)
GFR, Estimated: >60 mL/min (ref >60)
Glucose, Bld: 169 mg/dL — ABNORMAL HIGH (ref 70–99)
Potassium: 4.3 mmol/L (ref 3.5–5.1)
Sodium: 123 mmol/L — ABNORMAL LOW (ref 135–145)

## 2022-02-17 LAB — LACTIC ACID, PLASMA: Lactic Acid, Venous: 0.8 mmol/L (ref 0.5–1.9)

## 2022-02-17 LAB — GLUCOSE, CAPILLARY: Glucose-Capillary: 204 mg/dL — ABNORMAL HIGH (ref 70–99)

## 2022-02-17 LAB — SARS CORONAVIRUS 2 BY RT PCR: SARS Coronavirus 2 by RT PCR: NEGATIVE

## 2022-02-17 MED ORDER — POLYVINYL ALCOHOL 1.4 % OP SOLN: 1.0000 [drp] | Freq: Three times a day (TID) | OPHTHALMIC | Status: DC | PRN

## 2022-02-17 MED ORDER — LOSARTAN POTASSIUM 50 MG PO TABS
100.0000 mg | ORAL_TABLET | Freq: Every day | ORAL | Status: DC
  Administered 2022-02-18 – 2022-02-23 (×6): 100 mg via ORAL
  Filled 2022-02-17 (×6): qty 2

## 2022-02-17 MED ORDER — MORPHINE SULFATE (PF) 2 MG/ML IV SOLN
2.0000 mg | INTRAVENOUS | Status: DC | PRN
  Administered 2022-02-18: 2 mg via INTRAVENOUS
  Filled 2022-02-17: qty 1

## 2022-02-17 MED ORDER — SODIUM CHLORIDE 0.9 % IV SOLN
500.0000 mg | INTRAVENOUS | Status: AC
  Administered 2022-02-18 – 2022-02-22 (×5): 500 mg via INTRAVENOUS
  Filled 2022-02-17 (×5): qty 5

## 2022-02-17 MED ORDER — LIDOCAINE 4 % EX CREA: TOPICAL_CREAM | Freq: Three times a day (TID) | CUTANEOUS | Status: DC | PRN

## 2022-02-17 MED ORDER — INSULIN ASPART 100 UNIT/ML IJ SOLN
0.0000 [IU] | Freq: Three times a day (TID) | INTRAMUSCULAR | Status: DC
  Administered 2022-02-18 – 2022-02-19 (×5): 2 [IU] via SUBCUTANEOUS
  Administered 2022-02-19 – 2022-02-20 (×4): 3 [IU] via SUBCUTANEOUS
  Administered 2022-02-21: 2 [IU] via SUBCUTANEOUS
  Administered 2022-02-21 (×2): 3 [IU] via SUBCUTANEOUS
  Administered 2022-02-22: 8 [IU] via SUBCUTANEOUS
  Administered 2022-02-22: 3 [IU] via SUBCUTANEOUS
  Administered 2022-02-22 – 2022-02-23 (×3): 5 [IU] via SUBCUTANEOUS

## 2022-02-17 MED ORDER — SODIUM CHLORIDE 0.9 % IV SOLN
1.0000 g | Freq: Once | INTRAVENOUS | Status: AC
  Administered 2022-02-17: 1 g via INTRAVENOUS
  Filled 2022-02-17: qty 10

## 2022-02-17 MED ORDER — AMLODIPINE BESYLATE 5 MG PO TABS
10.0000 mg | ORAL_TABLET | Freq: Every day | ORAL | Status: DC
Start: 2022-02-18 — End: 2022-02-23
  Administered 2022-02-18 – 2022-02-23 (×6): 10 mg via ORAL
  Filled 2022-02-17 (×6): qty 2

## 2022-02-17 MED ORDER — SODIUM CHLORIDE 0.9 % IV SOLN
500.0000 mg | Freq: Once | INTRAVENOUS | Status: AC
  Administered 2022-02-17: 500 mg via INTRAVENOUS
  Filled 2022-02-17: qty 5

## 2022-02-17 MED ORDER — ACETAMINOPHEN 325 MG PO TABS
650.0000 mg | ORAL_TABLET | Freq: Four times a day (QID) | ORAL | Status: DC | PRN
Start: 2022-02-17 — End: 2022-02-23
  Administered 2022-02-19 – 2022-02-22 (×4): 650 mg via ORAL
  Filled 2022-02-17 (×4): qty 2

## 2022-02-17 MED ORDER — FAMOTIDINE 20 MG PO TABS
20.0000 mg | ORAL_TABLET | Freq: Every day | ORAL | Status: DC
  Administered 2022-02-17 – 2022-02-22 (×6): 20 mg via ORAL
  Filled 2022-02-17 (×6): qty 1

## 2022-02-17 MED ORDER — OLOPATADINE HCL 0.1 % OP SOLN
1.0000 [drp] | Freq: Two times a day (BID) | OPHTHALMIC | Status: DC
  Administered 2022-02-18 – 2022-02-23 (×11): 1 [drp] via OPHTHALMIC
  Filled 2022-02-17: qty 5

## 2022-02-17 MED ORDER — ALBUTEROL SULFATE (2.5 MG/3ML) 0.083% IN NEBU
2.5000 mg | INHALATION_SOLUTION | RESPIRATORY_TRACT | Status: DC | PRN
  Administered 2022-02-18: 2.5 mg via RESPIRATORY_TRACT
  Filled 2022-02-17: qty 3

## 2022-02-17 MED ORDER — GABAPENTIN 100 MG PO CAPS
100.0000 mg | ORAL_CAPSULE | Freq: Three times a day (TID) | ORAL | Status: DC
  Administered 2022-02-17 – 2022-02-23 (×17): 100 mg via ORAL
  Filled 2022-02-17 (×17): qty 1

## 2022-02-17 MED ORDER — SODIUM CHLORIDE 0.9 % IV SOLN
2.0000 g | INTRAVENOUS | Status: AC
  Administered 2022-02-18 – 2022-02-22 (×5): 2 g via INTRAVENOUS
  Filled 2022-02-17 (×5): qty 20

## 2022-02-17 MED ORDER — BENZONATATE 100 MG PO CAPS
100.0000 mg | ORAL_CAPSULE | Freq: Three times a day (TID) | ORAL | Status: DC | PRN
Start: 2022-02-17 — End: 2022-02-23
  Administered 2022-02-17 – 2022-02-20 (×6): 100 mg via ORAL
  Filled 2022-02-17 (×6): qty 1

## 2022-02-17 MED ORDER — SODIUM CHLORIDE 0.9 % IV BOLUS
2000.0000 mL | Freq: Once | INTRAVENOUS | Status: AC
  Administered 2022-02-17: 2000 mL via INTRAVENOUS

## 2022-02-17 MED ORDER — HYDRALAZINE HCL 25 MG PO TABS
25.0000 mg | ORAL_TABLET | Freq: Two times a day (BID) | ORAL | Status: DC
  Administered 2022-02-17 – 2022-02-23 (×10): 25 mg via ORAL
  Filled 2022-02-17 (×12): qty 1

## 2022-02-17 MED ORDER — LOSARTAN POTASSIUM 25 MG PO TABS: 100.0000 mg | ORAL_TABLET | Freq: Every day | ORAL | Status: DC

## 2022-02-17 MED ORDER — PANTOPRAZOLE SODIUM 40 MG PO TBEC
40.0000 mg | DELAYED_RELEASE_TABLET | Freq: Every day | ORAL | Status: DC
  Administered 2022-02-17 – 2022-02-23 (×7): 40 mg via ORAL
  Filled 2022-02-17 (×7): qty 1

## 2022-02-17 MED ORDER — AMLODIPINE BESYLATE 5 MG PO TABS: 10.0000 mg | ORAL_TABLET | Freq: Every day | ORAL | Status: DC

## 2022-02-17 MED ORDER — OXYCODONE HCL 5 MG PO TABS
5.0000 mg | ORAL_TABLET | Freq: Four times a day (QID) | ORAL | Status: DC | PRN
  Administered 2022-02-17 – 2022-02-22 (×6): 5 mg via ORAL
  Filled 2022-02-17 (×6): qty 1

## 2022-02-17 MED ORDER — ALPRAZOLAM 0.25 MG PO TABS
0.2500 mg | ORAL_TABLET | Freq: Two times a day (BID) | ORAL | Status: DC | PRN
  Administered 2022-02-17 – 2022-02-22 (×6): 0.25 mg via ORAL
  Filled 2022-02-17 (×6): qty 1

## 2022-02-17 MED ORDER — INSULIN ASPART 100 UNIT/ML IJ SOLN
0.0000 [IU] | Freq: Every day | INTRAMUSCULAR | Status: DC
  Administered 2022-02-17 – 2022-02-22 (×2): 2 [IU] via SUBCUTANEOUS

## 2022-02-17 MED ORDER — HEPARIN SODIUM (PORCINE) 5000 UNIT/ML IJ SOLN
5000.0000 [IU] | Freq: Three times a day (TID) | INTRAMUSCULAR | Status: DC
  Administered 2022-02-17 – 2022-02-23 (×17): 5000 [IU] via SUBCUTANEOUS
  Filled 2022-02-17 (×17): qty 1

## 2022-02-17 NOTE — Assessment & Plan Note (Signed)
-   Continue Xanax 

## 2022-02-17 NOTE — ED Triage Notes (Signed)
Pt with congestion and cough since Monday.  Covid test was done on Tuesday and was negative. Pt resides at [facility]. Denies any fever. Decrease in appetite.

## 2022-02-17 NOTE — ED Provider Notes (Signed)
[facility] EMERGENCY DEPARTMENT Provider Note   CSN: [id number] Arrival date & time: 02/17/22  1535     History  Chief Complaint  Patient presents with   Cough    [name] is a 88 y.o. female with a history significant for type 2 diabetes, hypertension hyperlipidemia, cognitive dysfunction presenting from [facility] where she resides for complaints of cough, congestion, general myalgias and shortness of breath.  She denies fever, however upon presentation she actually does have a low-grade fever at 100.2 here.  Per nursing home reports she was tested for COVID 5 days ago and was negative.  Her primary provider prescribed Tessalon and suspected her symptoms were secondary to a viral source.  Patient states the cough medicine has not helped relieve her symptoms.  Her cough has been wet sounding, but she denies being able to expectorate.  She denies chest pain, abdominal pain, nausea or vomiting.  She does endorse generalized weakness.  The history is provided by the patient and the nursing home.       Home Medications Prior to Admission medications   Medication Sig Start Date End Date Taking? Authorizing Provider  acetaminophen (ACETAMINOPHEN EXTRA STRENGTH) 500 MG tablet Take 1 tablet (500 mg total) by mouth every 6 (six) hours as needed. 08/25/21   Kathyrn Drown, MD  ALLERGY RELIEF 10 MG tablet TAKE (1) TABLET BY MOUTH ONCE DAILY. 01/29/22   Kathyrn Drown, MD  ALPRAZolam (XANAX) 0.25 MG tablet TAKE 1 TABLET BY MOUTH TWICE A DAY. 04/03/21   Kathyrn Drown, MD  amLODipine (NORVASC) 10 MG tablet Take 1 tablet (10 mg total) by mouth daily. 04/03/21   Kathyrn Drown, MD  ANTI-DIARRHEAL 2 MG tablet TAKE 1 TABLET BY MOUTH 3 TIMES A DAY AS NEEDED FOR DIARRHEA. 09/03/21   Kathyrn Drown, MD  benzonatate (TESSALON) 100 MG capsule Take 1 capsule (100 mg total) by mouth 3 (three) times daily as needed for cough. 02/13/22   Kathyrn Drown, MD  beta carotene w/minerals (OCUVITE) tablet TAKE  1 TABLET BY MOUTH AT BEDTIME. 04/16/20   Luking, Elayne Snare, MD  Carboxymethylcellulose Sodium (THERATEARS) 0.25 % SOLN Apply 1 drop to eye 3 (three) times daily as needed (dry eyes).    [provider]  Cyanocobalamin (VITAMIN B-12) 2500 MCG SUBL TAKE 1/2 TABLET (1250MCG) BY MOUTH AT BEDTIME. 03/08/21   Lovena Le, Malena M, DO  dicyclomine (BENTYL) 10 MG capsule 1 bid prn abd cramps and loose stool Patient taking differently: Take 10 mg by mouth 2 (two) times daily as needed (Abdominal cramps and loose stools). 03/23/19   Kathyrn Drown, MD  EASYMAX TEST test strip CHECK BLOOD SUGAR ONCE DAILY.(CALL MD IF BS BELOW 60: OR IF BS ABOVE 400) 12/27/21   Luking, Elayne Snare, MD  famotidine (PEPCID) 20 MG tablet TAKE 1 TABLET BY MOUTH EACH MORNING. 03/08/21   Lovena Le, Malena M, DO  gabapentin (NEURONTIN) 100 MG capsule Take 100 mg by mouth 3 (three) times daily. 11/06/21   [provider]  hydrALAZINE (APRESOLINE) 25 MG tablet Take 1 tablet (25 mg total) by mouth 2 (two) times daily. 04/03/21   Kathyrn Drown, MD  hydrOXYzine (ATARAX) 25 MG tablet TAKE 1 TABLET BY MOUTH 3 TIMES A DAY AS NEEDED FOR ITCHING. 10/22/21   Kathyrn Drown, MD  losartan (COZAAR) 100 MG tablet TAKE (1) TABLET BY MOUTH ONCE DAILY. 12/21/21   Kathyrn Drown, MD  metFORMIN (GLUCOPHAGE) 500 MG tablet Take  one tablet '500mg'$  po in the morning and 1/2 tablet 250 mg po at supper 04/03/21   Sallee Lange A, MD  Olopatadine HCl 0.2 % SOLN Place 1 drop into both eyes daily.    [provider]  Omega-3 Fatty Acids (FISH OIL) 1000 MG CAPS One capsule BID 08/07/21   Luking, Elayne Snare, MD  pantoprazole (PROTONIX) 20 MG tablet TAKE (1) TABLET BY MOUTH ONCE DAILY. 04/03/21   Kathyrn Drown, MD  Vitamin D, Cholecalciferol, 10 MCG (400 UNIT) TABS TAKE 1 TABLET BY MOUTH ONCE DAILY. 05/12/20   Kathyrn Drown, MD      Allergies    Hydrocortisone, Lisinopril, Phenergan [promethazine hcl], Statins, Trazodone and nefazodone, and Prednisone     Review of Systems   Review of Systems  Constitutional:  Positive for fever.  HENT:  Negative for congestion and sore throat.   Eyes: Negative.   Respiratory:  Positive for cough and shortness of breath. Negative for chest tightness.   Cardiovascular:  Negative for chest pain, palpitations and leg swelling.  Gastrointestinal:  Negative for abdominal pain, nausea and vomiting.  Genitourinary: Negative.   Musculoskeletal:  Positive for myalgias. Negative for arthralgias, joint swelling and neck pain.  Skin: Negative.  Negative for rash and wound.  Neurological:  Negative for dizziness, weakness, light-headedness, numbness and headaches.  Psychiatric/Behavioral: Negative.    All other systems reviewed and are negative.   Physical Exam Updated Vital Signs BP (!) 172/56   Pulse 91   Temp 100.2 F (37.9 C) (Oral)   Resp (!) 22   Ht '5\' 8"'$  (1.727 m)   Wt 80.7 kg   SpO2 96%   BMI 27.06 kg/m  Physical Exam Vitals and nursing note reviewed.  Constitutional:      Appearance: She is well-developed.  HENT:     Head: Normocephalic and atraumatic.  Eyes:     Conjunctiva/sclera: Conjunctivae normal.  Cardiovascular:     Rate and Rhythm: Normal rate and regular rhythm.     Heart sounds: Normal heart sounds.  Pulmonary:     Effort: Pulmonary effort is normal. Tachypnea present.     Breath sounds: No stridor. Rhonchi present. No wheezing.     Comments: Bilateral rhonchi.  Wet sounding cough. Abdominal:     General: Bowel sounds are normal.     Palpations: Abdomen is soft.     Tenderness: There is no abdominal tenderness.  Musculoskeletal:        General: Normal range of motion.     Cervical back: Normal range of motion.  Skin:    General: Skin is warm and dry.  Neurological:     Mental Status: She is alert.     ED Results / Procedures / Treatments   Labs (all labs ordered are listed, but only abnormal results are displayed) Labs Reviewed  CBC WITH DIFFERENTIAL/PLATELET -  Abnormal; Notable for the following components:      Result Value   WBC 15.1 (*)    Hemoglobin 11.6 (*)    HCT 34.8 (*)    Neutro Abs 12.4 (*)    Monocytes Absolute 1.2 (*)    Abs Immature Granulocytes 0.09 (*)    All other components within normal limits  BASIC METABOLIC PANEL - Abnormal; Notable for the following components:   Sodium 123 (*)    Chloride 87 (*)    Glucose, Bld 169 (*)    Calcium 8.7 (*)    All other components within normal limits  SARS  CORONAVIRUS 2 BY RT PCR  CULTURE, BLOOD (ROUTINE X 2)  CULTURE, BLOOD (ROUTINE X 2)  LACTIC ACID, PLASMA  LACTIC ACID, PLASMA  BASIC METABOLIC PANEL    EKG None  Radiology DG Chest 2 View  Result Date: 02/17/2022 CLINICAL DATA:  Cough, congestion EXAM: CHEST - 2 VIEW COMPARISON:  02/27/2021 FINDINGS: Bilateral mild interstitial thickening. Right middle lobe airspace disease which may reflect atelectasis versus pneumonia. No pleural effusion or pneumothorax. Heart and mediastinal contours are unremarkable. No acute osseous abnormality. IMPRESSION: 1. Right middle lobe airspace disease which may reflect atelectasis versus pneumonia. Electronically Signed   By: Kathreen Devoid M.D.   On: 02/17/2022 16:54    Procedures Procedures    Medications Ordered in ED Medications  azithromycin (ZITHROMAX) 500 mg in sodium chloride 0.9 % 250 mL IVPB (has no administration in time range)  sodium chloride 0.9 % bolus 2,000 mL (2,000 mLs Intravenous New Bag/Given 02/17/22 1854)  cefTRIAXone (ROCEPHIN) 1 g in sodium chloride 0.9 % 100 mL IVPB (1 g Intravenous New Bag/Given 02/17/22 1853)    ED Course/ Medical Decision Making/ A&P                           Medical Decision Making Patient presenting with cough, shortness of breath and a low-grade fever.  Chest x-ray today suggesting pneumonia as her exam  Labs are also significant for hyponatremia with a sodium of 123.  She has a WBC count of 15.1.  Glucose 169.  Amount and/or Complexity of  Data Reviewed Labs: ordered.    Details: Per above. Radiology: ordered.    Details: Per above. Discussion of management or test interpretation with external provider(s): Call placed to the hospitalist for admission.  Discussed with Dr. Clearence Ped who accepts pt for admission.  Risk Decision regarding hospitalization.           Final Clinical Impression(s) / ED Diagnoses Final diagnoses:  Healthcare-associated pneumonia  Hyponatremia    Rx / DC Orders ED Discharge Orders     None         Landis Martins 02/17/22 1941    Milton Ferguson, MD 02/18/22 1501

## 2022-02-17 NOTE — Assessment & Plan Note (Signed)
-   Last hemoglobin A1c was at goal 7.0 - Hold oral hypoglycemics - Sliding scale coverage - Continue to monitor

## 2022-02-17 NOTE — Assessment & Plan Note (Signed)
-   Sodium normally runs 135, today 123 - Patient has not been feeling well, likely due to poor p.o. intake - 2 L normal saline bolus in the ED, so before adding more fluids rechecking BMP - Continue to monitor

## 2022-02-17 NOTE — H&P (Signed)
History and Physical    Patient: [name] HFW:[id number] DOB: [birth year] DOA: 02/17/2022 DOS: the patient was seen and examined on 02/17/2022 PCP: Kathyrn Drown, MD  Patient coming from: ALF/ILF  Chief Complaint:  Chief Complaint  Patient presents with   Cough   HPI: [name] is a 88 y.o. female with medical history significant of dementia, diabetes mellitus type 2, hypertension, GERD, and more presents the ED with a chief complaint of dyspnea.  Patient reports the symptoms started 6 days ago.  It started with a sore throat, so she gargled salt water and that got better.  The next day she started having a cough and dyspnea.  Her PCP sent her in [place] that were not really helping.  The cough has been nonproductive.  She has felt totally wiped out, and has had dyspnea that is worse on exertion.  She has had associated myalgias.  Tmax prior to arrival was 99.2, in the ER 100.2.  Patient denies any nausea vomiting diarrhea constipation.  She does report loose stools 1-2 times per day that are nonbloody.  She reports she has not been eating much, only yogurt the past couple of days and she attributes her loose stools to that.  She has no dysuria.  Patient has no other complaints at this time.  Patient does not smoke, does not drink, does not use illicit drugs.  She is vaccinated for COVID.  Patient is DNR.  Review of Systems: As mentioned in the history of present illness. All other systems reviewed and are negative. Past Medical History:  Diagnosis Date   Cognitive dysfunction 03/04/2019   Patient scores 21 out of 30 on [place] cognitive assessment September 2020   Diabetes mellitus without complication ([place])    Diverticulitis    Frailty 03/04/2019   H/O bilateral breast reduction surgery    Hyperlipidemia    a. intolerant to statins.    Hypertension    Past Surgical History:  Procedure Laterality Date   ABDOMINAL HYSTERECTOMY     APPENDECTOMY     BREAST REDUCTION  SURGERY     COLON SURGERY Left    Hemicolectomy due to diverticulitis   EYE SURGERY  03/30/2009   cataract   KNEE SURGERY     LAPAROSCOPIC INCISIONAL / UMBILICAL / VENTRAL HERNIA REPAIR  02/23/2007    REDUCTION MAMMAPLASTY Bilateral 2001   REFRACTIVE SURGERY     Social History:  reports that she has never smoked. She has never used smokeless tobacco. She reports that she does not drink alcohol and does not use drugs.  Allergies  Allergen Reactions   Hydrocortisone Itching   Lisinopril     Other reaction(s): Lethargy (intolerance)   Phenergan [Promethazine Hcl] Other (See Comments)    hallucinations   Statins Other (See Comments)    Severe myalgias   Trazodone And Nefazodone Cough   Prednisone Rash    Family History  Problem Relation Age of Onset   Hypertension Father        kidney   Stroke Father    Cancer Father    Cancer Mother        ovaian   Diabetes Brother    Hypertension Sister     Prior to Admission medications   Medication Sig Start Date End Date Taking? Authorizing Provider  acetaminophen (ACETAMINOPHEN EXTRA STRENGTH) 500 MG tablet Take 1 tablet (500 mg total) by mouth every 6 (six) hours as needed. 08/25/21   Kathyrn Drown, MD  ALLERGY RELIEF 10 MG tablet TAKE (1) TABLET BY MOUTH ONCE DAILY. 01/29/22   Kathyrn Drown, MD  ALPRAZolam (XANAX) 0.25 MG tablet TAKE 1 TABLET BY MOUTH TWICE A DAY. 04/03/21   Kathyrn Drown, MD  amLODipine (NORVASC) 10 MG tablet Take 1 tablet (10 mg total) by mouth daily. 04/03/21   Kathyrn Drown, MD  ANTI-DIARRHEAL 2 MG tablet TAKE 1 TABLET BY MOUTH 3 TIMES A DAY AS NEEDED FOR DIARRHEA. 09/03/21   Kathyrn Drown, MD  benzonatate (TESSALON) 100 MG capsule Take 1 capsule (100 mg total) by mouth 3 (three) times daily as needed for cough. 02/13/22   Kathyrn Drown, MD  beta carotene w/minerals (OCUVITE) tablet TAKE 1 TABLET BY MOUTH AT BEDTIME. 04/16/20   Luking, Elayne Snare, MD  Carboxymethylcellulose Sodium (THERATEARS) 0.25 % SOLN Apply  1 drop to eye 3 (three) times daily as needed (dry eyes).    [provider]  Cyanocobalamin (VITAMIN B-12) 2500 MCG SUBL TAKE 1/2 TABLET (1250MCG) BY MOUTH AT BEDTIME. 03/08/21   Lovena Le, Malena M, DO  dicyclomine (BENTYL) 10 MG capsule 1 bid prn abd cramps and loose stool Patient taking differently: Take 10 mg by mouth 2 (two) times daily as needed (Abdominal cramps and loose stools). 03/23/19   Kathyrn Drown, MD  EASYMAX TEST test strip CHECK BLOOD SUGAR ONCE DAILY.(CALL MD IF BS BELOW 60: OR IF BS ABOVE 400) 12/27/21   Luking, Elayne Snare, MD  famotidine (PEPCID) 20 MG tablet TAKE 1 TABLET BY MOUTH EACH MORNING. 03/08/21   Lovena Le, Malena M, DO  gabapentin (NEURONTIN) 100 MG capsule Take 100 mg by mouth 3 (three) times daily. 11/06/21   [provider]  hydrALAZINE (APRESOLINE) 25 MG tablet Take 1 tablet (25 mg total) by mouth 2 (two) times daily. 04/03/21   Kathyrn Drown, MD  hydrOXYzine (ATARAX) 25 MG tablet TAKE 1 TABLET BY MOUTH 3 TIMES A DAY AS NEEDED FOR ITCHING. 10/22/21   Kathyrn Drown, MD  losartan (COZAAR) 100 MG tablet TAKE (1) TABLET BY MOUTH ONCE DAILY. 12/21/21   Kathyrn Drown, MD  metFORMIN (GLUCOPHAGE) 500 MG tablet Take one tablet '500mg'$  po in the morning and 1/2 tablet 250 mg po at supper 04/03/21   Sallee Lange A, MD  Olopatadine HCl 0.2 % SOLN Place 1 drop into both eyes daily.    [provider]  Omega-3 Fatty Acids (FISH OIL) 1000 MG CAPS One capsule BID 08/07/21   Luking, Elayne Snare, MD  pantoprazole (PROTONIX) 20 MG tablet TAKE (1) TABLET BY MOUTH ONCE DAILY. 04/03/21   Kathyrn Drown, MD  Vitamin D, Cholecalciferol, 10 MCG (400 UNIT) TABS TAKE 1 TABLET BY MOUTH ONCE DAILY. 05/12/20   Kathyrn Drown, MD    Physical Exam: Vitals:   02/17/22 1845 02/17/22 1900 02/17/22 2000 02/17/22 2030  BP:  (!) 172/56 (!) 171/67 (!) 160/64  Pulse: 93 91 99 (!) 101  Resp: 20 (!) 22 (!) 21 (!) 26  Temp:    99.1 F (37.3 C)  TempSrc:    Oral  SpO2: 96% 96% 93% 94%   Weight:      Height:       1.  General: Patient lying supine in bed,  no acute distress   2. Psychiatric: Alert and oriented x 3, mood and behavior normal for situation, pleasant and cooperative with exam   3. Neurologic: Speech and language are normal, face is symmetric, moves all 4 extremities voluntarily,  at baseline without acute deficits on limited exam   4. HEENMT:  Head is atraumatic, normocephalic, pupils reactive to light, neck is supple, trachea is midline, mucous membranes are moist   5. Respiratory : Lungs are rhonchorous diffusely, cough sounds wet but is nonproductive, no cyanosis, no increased work of breathing, maintaining oxygen saturations on 2 L nasal cannula   6. Cardiovascular : Heart rate normal, rhythm is regular, no murmurs, rubs or gallops, no peripheral edema, peripheral pulses palpated   7. Gastrointestinal:  Abdomen is soft, nondistended, nontender to palpation bowel sounds active, no masses or organomegaly palpated   8. Skin:  Skin is warm, dry and intact without rashes, acute lesions, or ulcers on limited exam   9.Musculoskeletal:  No acute deformities or trauma, no asymmetry in tone, no peripheral edema, peripheral pulses palpated, no tenderness to palpation in the extremities  Data Reviewed: In the ED Temp 100.2, heart rate 89-101, respiratory rate 18-26, blood pressure 172/56-188/103 satting at 96% Leukocytosis at 15.1, hemoglobin 11.6, platelets 293 Sodium 123, chloride 87 COVID test negative Chest x-ray shows right middle lobe airspace disease which may reflect infection Zithromax and Rocephin started 2 L bolus given Admission requested for management of pneumonia and hyponatremia  Assessment and Plan: * Pneumonia - X-ray findings suggesting pneumonia of the right middle lobe on chest x-ray as well as leukocytosis and cough -Started on Rocephin and Zithromax - Continue same antibiotics - Expectorated sputum culture - Blood  cultures - Strep and Legionella urine antigens - Tessalon for cough - Curb 65 score = 1 - low risk - Continue to monitor  GERD (gastroesophageal reflux disease) - Continue Pepcid and Protonix  Acute respiratory failure with hypoxia (HCC) - Oxygen sats down to 88% - Requiring 2 L nasal cannula to maintain saturations - No oxygen at home - Secondary to pneumonia - Albuterol as needed - Wean off O2 as tolerated - Continue to monitor  Generalized anxiety disorder - Continue Xanax  Hyponatremia - Sodium normally runs 135, today 123 - Patient has not been feeling well, likely due to poor p.o. intake - 2 L normal saline bolus in the ED, so before adding more fluids rechecking BMP - Continue to monitor  Essential hypertension - Continue hydralazine, losartan, Norvasc - Continue to monitor  Controlled type 2 diabetes mellitus without complication, without long-term current use of insulin (HCC) - Last hemoglobin A1c was at goal 7.0 - Hold oral hypoglycemics - Sliding scale coverage - Continue to monitor      Advance Care Planning:   Code Status: DNR   Consults: None   Family Communication: No family at bedside  Severity of Illness: The appropriate patient status for this patient is OBSERVATION. Observation status is judged to be reasonable and necessary in order to provide the required intensity of service to ensure the patient's safety. The patient's presenting symptoms, physical exam findings, and initial radiographic and laboratory data in the context of their medical condition is felt to place them at decreased risk for further clinical deterioration. Furthermore, it is anticipated that the patient will be medically stable for discharge from the hospital within 2 midnights of admission.   Author: Rolla Plate, DO 02/17/2022 9:02 PM  For on call review [email or web address].

## 2022-02-17 NOTE — Assessment & Plan Note (Signed)
-   Continue Pepcid and Protonix 

## 2022-02-17 NOTE — Assessment & Plan Note (Signed)
-   Continue hydralazine, losartan, Norvasc - Continue to monitor

## 2022-02-17 NOTE — Assessment & Plan Note (Addendum)
-   X-ray findings suggesting pneumonia of the right middle lobe on chest x-ray as well as leukocytosis and cough -Started on Rocephin and Zithromax - Continue same antibiotics - Expectorated sputum culture - Blood cultures - Strep and Legionella urine antigens - Tessalon for cough - Curb 65 score = 1 - low risk - Continue to monitor

## 2022-02-17 NOTE — ED Notes (Addendum)
Pt's RA sats 88-89%, O2 applied [place] @ 2L/M and sats increased to 93-95%

## 2022-02-17 NOTE — Assessment & Plan Note (Signed)
-   Oxygen sats down to 88% - Requiring 2 L nasal cannula to maintain saturations - No oxygen at home - Secondary to pneumonia - Albuterol as needed - Wean off O2 as tolerated - Continue to monitor

## 2022-02-18 DIAGNOSIS — I152 Hypertension secondary to endocrine disorders: Secondary | ICD-10-CM | POA: Diagnosis not present

## 2022-02-18 DIAGNOSIS — J9601 Acute respiratory failure with hypoxia: Secondary | ICD-10-CM | POA: Diagnosis present

## 2022-02-18 DIAGNOSIS — J90 Pleural effusion, not elsewhere classified: Secondary | ICD-10-CM | POA: Diagnosis not present

## 2022-02-18 DIAGNOSIS — E871 Hypo-osmolality and hyponatremia: Secondary | ICD-10-CM | POA: Diagnosis present

## 2022-02-18 DIAGNOSIS — Z8249 Family history of ischemic heart disease and other diseases of the circulatory system: Secondary | ICD-10-CM | POA: Diagnosis not present

## 2022-02-18 DIAGNOSIS — E119 Type 2 diabetes mellitus without complications: Secondary | ICD-10-CM | POA: Diagnosis present

## 2022-02-18 DIAGNOSIS — E785 Hyperlipidemia, unspecified: Secondary | ICD-10-CM | POA: Diagnosis present

## 2022-02-18 DIAGNOSIS — L89151 Pressure ulcer of sacral region, stage 1: Secondary | ICD-10-CM | POA: Diagnosis present

## 2022-02-18 DIAGNOSIS — R059 Cough, unspecified: Secondary | ICD-10-CM | POA: Diagnosis not present

## 2022-02-18 DIAGNOSIS — Z66 Do not resuscitate: Secondary | ICD-10-CM | POA: Diagnosis present

## 2022-02-18 DIAGNOSIS — K219 Gastro-esophageal reflux disease without esophagitis: Secondary | ICD-10-CM | POA: Diagnosis present

## 2022-02-18 DIAGNOSIS — E222 Syndrome of inappropriate secretion of antidiuretic hormone: Secondary | ICD-10-CM | POA: Diagnosis present

## 2022-02-18 DIAGNOSIS — R262 Difficulty in walking, not elsewhere classified: Secondary | ICD-10-CM | POA: Diagnosis not present

## 2022-02-18 DIAGNOSIS — F0390 Unspecified dementia without behavioral disturbance: Secondary | ICD-10-CM | POA: Diagnosis not present

## 2022-02-18 DIAGNOSIS — E1159 Type 2 diabetes mellitus with other circulatory complications: Secondary | ICD-10-CM | POA: Diagnosis not present

## 2022-02-18 DIAGNOSIS — Z888 Allergy status to other drugs, medicaments and biological substances status: Secondary | ICD-10-CM | POA: Diagnosis not present

## 2022-02-18 DIAGNOSIS — F411 Generalized anxiety disorder: Secondary | ICD-10-CM | POA: Diagnosis present

## 2022-02-18 DIAGNOSIS — K649 Unspecified hemorrhoids: Secondary | ICD-10-CM | POA: Diagnosis not present

## 2022-02-18 DIAGNOSIS — J189 Pneumonia, unspecified organism: Secondary | ICD-10-CM | POA: Diagnosis present

## 2022-02-18 DIAGNOSIS — R279 Unspecified lack of coordination: Secondary | ICD-10-CM | POA: Diagnosis not present

## 2022-02-18 DIAGNOSIS — R488 Other symbolic dysfunctions: Secondary | ICD-10-CM | POA: Diagnosis not present

## 2022-02-18 DIAGNOSIS — R0602 Shortness of breath: Secondary | ICD-10-CM | POA: Diagnosis not present

## 2022-02-18 DIAGNOSIS — I10 Essential (primary) hypertension: Secondary | ICD-10-CM | POA: Diagnosis present

## 2022-02-18 DIAGNOSIS — Z833 Family history of diabetes mellitus: Secondary | ICD-10-CM | POA: Diagnosis not present

## 2022-02-18 DIAGNOSIS — F0394 Unspecified dementia, unspecified severity, with anxiety: Secondary | ICD-10-CM | POA: Diagnosis present

## 2022-02-18 DIAGNOSIS — Z20822 Contact with and (suspected) exposure to covid-19: Secondary | ICD-10-CM | POA: Diagnosis present

## 2022-02-18 DIAGNOSIS — R498 Other voice and resonance disorders: Secondary | ICD-10-CM | POA: Diagnosis not present

## 2022-02-18 DIAGNOSIS — Z79899 Other long term (current) drug therapy: Secondary | ICD-10-CM | POA: Diagnosis not present

## 2022-02-18 DIAGNOSIS — R41841 Cognitive communication deficit: Secondary | ICD-10-CM | POA: Diagnosis not present

## 2022-02-18 DIAGNOSIS — Y95 Nosocomial condition: Secondary | ICD-10-CM | POA: Diagnosis present

## 2022-02-18 DIAGNOSIS — Z9981 Dependence on supplemental oxygen: Secondary | ICD-10-CM | POA: Diagnosis not present

## 2022-02-18 DIAGNOSIS — Z741 Need for assistance with personal care: Secondary | ICD-10-CM | POA: Diagnosis not present

## 2022-02-18 DIAGNOSIS — R0600 Dyspnea, unspecified: Secondary | ICD-10-CM | POA: Diagnosis not present

## 2022-02-18 DIAGNOSIS — Z7984 Long term (current) use of oral hypoglycemic drugs: Secondary | ICD-10-CM | POA: Diagnosis not present

## 2022-02-18 DIAGNOSIS — M6281 Muscle weakness (generalized): Secondary | ICD-10-CM | POA: Diagnosis not present

## 2022-02-18 DIAGNOSIS — Z9049 Acquired absence of other specified parts of digestive tract: Secondary | ICD-10-CM | POA: Diagnosis not present

## 2022-02-18 LAB — COMPREHENSIVE METABOLIC PANEL
ALT: 11 U/L (ref 0–44)
AST: 12 U/L — ABNORMAL LOW (ref 15–41)
Albumin: 2.8 g/dL — ABNORMAL LOW (ref 3.5–5.0)
Alkaline Phosphatase: 72 U/L (ref 38–126)
Anion gap: 7 (ref 5–15)
BUN: 12 mg/dL (ref 8–23)
CO2: 26 mmol/L (ref 22–32)
Calcium: 7.9 mg/dL — ABNORMAL LOW (ref 8.9–10.3)
Chloride: 93 mmol/L — ABNORMAL LOW (ref 98–111)
Creatinine, Ser: 0.7 mg/dL (ref 0.44–1.00)
GFR, Estimated: >60 mL/min (ref >60)
Glucose, Bld: 135 mg/dL — ABNORMAL HIGH (ref 70–99)
Potassium: 3.9 mmol/L (ref 3.5–5.1)
Sodium: 126 mmol/L — ABNORMAL LOW (ref 135–145)
Total Bilirubin: 0.8 mg/dL (ref 0.3–1.2)
Total Protein: 5.9 g/dL — ABNORMAL LOW (ref 6.5–8.1)

## 2022-02-18 LAB — GLUCOSE, CAPILLARY
Glucose-Capillary: 131 mg/dL — ABNORMAL HIGH (ref 70–99)
Glucose-Capillary: 146 mg/dL — ABNORMAL HIGH (ref 70–99)
Glucose-Capillary: 134 mg/dL — ABNORMAL HIGH (ref 70–99)
Glucose-Capillary: 176 mg/dL — ABNORMAL HIGH (ref 70–99)

## 2022-02-18 LAB — CBC WITH DIFFERENTIAL/PLATELET
Abs Immature Granulocytes: 0.08 10*3/uL — ABNORMAL HIGH (ref 0.00–0.07)
Basophils Absolute: 0 10*3/uL (ref 0.0–0.1)
Basophils Relative: 0 %
Eosinophils Absolute: 0 10*3/uL (ref 0.0–0.5)
Eosinophils Relative: 0 %
HCT: 29.3 % — ABNORMAL LOW (ref 36.0–46.0)
Hemoglobin: 9.7 g/dL — ABNORMAL LOW (ref 12.0–15.0)
Immature Granulocytes: 1 %
Lymphocytes Relative: 14 %
Lymphs Abs: 1.8 10*3/uL (ref 0.7–4.0)
MCH: 29 pg (ref 26.0–34.0)
MCHC: 33.1 g/dL (ref 30.0–36.0)
MCV: 87.5 fL (ref 80.0–100.0)
Monocytes Absolute: 1.4 10*3/uL — ABNORMAL HIGH (ref 0.1–1.0)
Monocytes Relative: 10 %
Neutro Abs: 9.8 10*3/uL — ABNORMAL HIGH (ref 1.7–7.7)
Neutrophils Relative %: 75 %
Platelets: 257 10*3/uL (ref 150–400)
RBC: 3.35 MIL/uL — ABNORMAL LOW (ref 3.87–5.11)
RDW: 13.5 % (ref 11.5–15.5)
WBC: 13.1 10*3/uL — ABNORMAL HIGH (ref 4.0–10.5)
nRBC: 0 % (ref 0.0–0.2)

## 2022-02-18 LAB — BASIC METABOLIC PANEL
Anion gap: 7 (ref 5–15)
BUN: 10 mg/dL (ref 8–23)
CO2: 25 mmol/L (ref 22–32)
Calcium: 7.7 mg/dL — ABNORMAL LOW (ref 8.9–10.3)
Chloride: 94 mmol/L — ABNORMAL LOW (ref 98–111)
Creatinine, Ser: 0.73 mg/dL (ref 0.44–1.00)
GFR, Estimated: >60 mL/min (ref >60)
Glucose, Bld: 149 mg/dL — ABNORMAL HIGH (ref 70–99)
Potassium: 3.9 mmol/L (ref 3.5–5.1)
Sodium: 126 mmol/L — ABNORMAL LOW (ref 135–145)

## 2022-02-18 LAB — STREP PNEUMONIAE URINARY ANTIGEN: Strep Pneumo Urinary Antigen: NEGATIVE

## 2022-02-18 LAB — OSMOLALITY, URINE: Osmolality, Ur: 380 mOsm/kg (ref 300–900)

## 2022-02-18 LAB — LACTIC ACID, PLASMA: Lactic Acid, Venous: 0.7 mmol/L (ref 0.5–1.9)

## 2022-02-18 LAB — MAGNESIUM: Magnesium: 1.5 mg/dL — ABNORMAL LOW (ref 1.7–2.4)

## 2022-02-18 LAB — OSMOLALITY: Osmolality: 267 mOsm/kg — ABNORMAL LOW (ref 275–295)

## 2022-02-18 LAB — HIV ANTIBODY (ROUTINE TESTING W REFLEX): HIV Screen 4th Generation wRfx: NONREACTIVE

## 2022-02-18 LAB — TSH: TSH: 1.342 u[IU]/mL (ref 0.350–4.500)

## 2022-02-18 LAB — SODIUM, URINE, RANDOM: Sodium, Ur: 51 mmol/L

## 2022-02-18 MED ORDER — IPRATROPIUM-ALBUTEROL 0.5-2.5 (3) MG/3ML IN SOLN
3.0000 mL | Freq: Four times a day (QID) | RESPIRATORY_TRACT | Status: DC
  Administered 2022-02-19 (×2): 3 mL via RESPIRATORY_TRACT
  Filled 2022-02-18 (×2): qty 3

## 2022-02-18 MED ORDER — MAGNESIUM SULFATE 2 GM/50ML IV SOLN
2.0000 g | Freq: Once | INTRAVENOUS | Status: AC
  Administered 2022-02-18: 2 g via INTRAVENOUS
  Filled 2022-02-18: qty 50

## 2022-02-18 MED ORDER — SODIUM CHLORIDE 0.9 % IV SOLN: INTRAVENOUS | Status: AC

## 2022-02-18 NOTE — TOC Initial Note (Signed)
Transition of Care [facility]) - Initial/Assessment Note    Patient Details  Name: [name] MRN: [id number] Date of Birth: 03-08-1932  [facility] CM/SW Contact:    Boneta Lucks, RN Phone Number: 02/18/2022, 3:13 PM  Clinical Narrative:              Patient admitted with pneumonia, currently on 2L of oxygen. Patient is from [facility].  FL2 started, TOC to follow.      Expected Discharge Plan: Assisted Living Barriers to Discharge: Continued Medical Work up   Patient Goals and CMS Choice Patient states their goals for this hospitalization and ongoing recovery are:: to returnt to ALF CMS Medicare.gov Compare Post Acute Care list provided to:: Patient Choice offered to / list presented to : Patient  Expected Discharge Plan and Services Expected Discharge Plan: Assisted Living      Prior Living Arrangements/Services   Lives with:: Domestic Partner   Activities of Daily Living Home Assistive Devices/Equipment: None ADL Screening (condition at time of admission) Patient's cognitive ability adequate to safely complete daily activities?: Yes Is the patient deaf or have difficulty hearing?: No Does the patient have difficulty seeing, even when wearing glasses/contacts?: No Does the patient have difficulty concentrating, remembering, or making decisions?: No Patient able to express need for assistance with ADLs?: Yes Does the patient have difficulty dressing or bathing?: No Independently performs ADLs?: Yes (appropriate for developmental age) Does the patient have difficulty walking or climbing stairs?: Yes Weakness of Legs: Both Weakness of Arms/Hands: None  Permission Sought/Granted     Emotional Assessment     Admission diagnosis:  Hyponatremia [E87.1] Pneumonia [J18.9] Healthcare-associated pneumonia [J18.9] Patient Active Problem List   Diagnosis Date Noted   Pneumonia 02/17/2022   GERD (gastroesophageal reflux disease) 02/17/2022   Acute  respiratory failure with hypoxia ([place]) 02/26/2021   Myalgia due to statin 02/15/2020   Cognitive dysfunction 03/04/2019   Generalized anxiety disorder 06/05/2018   Floaters in visual field, bilateral 03/23/2018   Living in assisted living 03/23/2018   Hyponatremia 03/08/2018   C. difficile diarrhea 03/03/2018   Osteoarthritis of right knee 12/02/2017   Closed fracture of left orbital floor with routine healing 01/17/2017   Hyperlipidemia associated with type 2 diabetes mellitus ([place]) 10/02/2016   Pedal edema 10/02/2016   Insomnia 10/02/2016   SBO (small bowel obstruction) ([place]) 07/02/2016   Dyspnea on exertion 02/24/2016   Other fatigue    Leukocytosis 12/16/2014   Essential hypertension 12/15/2014   Controlled type 2 diabetes mellitus without complication, without long-term current use of insulin ([place]) 02/15/2011   PCP:  Kathyrn Drown, MD Pharmacy:   [facility], [place] [facility] [address] [place] [address] Phone: [phone] Fax: [phone]   Readmission Risk Interventions     No data to display

## 2022-02-18 NOTE — Progress Notes (Signed)
PROGRESS NOTE    [name]  OAC:[id number] DOB: Apr 28, 1932 DOA: 02/17/2022 PCP: Kathyrn Drown, MD   Brief Narrative:    [name] is a 88 y.o. female with medical history significant of dementia, diabetes mellitus type 2, hypertension, GERD, and more presents the ED with a chief complaint of dyspnea.  Patient was admitted with community-acquired pneumonia to the right middle lobe along with associated acute hypoxemic respiratory failure.  She is also noted to have hyponatremia likely due to her poor p.o. intake recently.  Assessment & Plan:   Principal Problem:   Pneumonia Active Problems:   Controlled type 2 diabetes mellitus without complication, without long-term current use of insulin (HCC)   Essential hypertension   Hyponatremia   Generalized anxiety disorder   Acute respiratory failure with hypoxia (HCC)   GERD (gastroesophageal reflux disease)  Assessment and Plan:  Pneumonia - X-ray findings suggesting pneumonia of the right middle lobe on chest x-ray as well as leukocytosis and cough -Started on Rocephin and Zithromax - Continue same antibiotics - Expectorated sputum culture - Blood cultures - Strep and Legionella urine antigens - Tessalon for cough - Curb 65 score = 1 - low risk - Continue to monitor   GERD (gastroesophageal reflux disease) - Continue Pepcid and Protonix   Acute respiratory failure with hypoxia (HCC) - Oxygen sats down to 88% - Requiring 3 L nasal cannula to maintain saturations - No oxygen at home - Secondary to pneumonia - Albuterol as needed - Wean off O2 as tolerated - Continue to monitor   Generalized anxiety disorder - Continue Xanax   Hyponatremia - Sodium normally runs 135, today 123 - Patient has not been feeling well, likely due to poor p.o. intake - 2 L normal saline bolus in the ED, so before adding more fluids rechecking BMP - Continue to monitor -Check TSH, urine and serum osmolarity and urine sodium as  well, does not appear to be on any offending medications   Essential hypertension - Continue hydralazine, losartan, Norvasc - Continue to monitor   Controlled type 2 diabetes mellitus without complication, without long-term current use of insulin (HCC) - Last hemoglobin A1c was at goal 7.0 - Hold oral hypoglycemics - Sliding scale coverage - Continue to monitor   DVT prophylaxis: Heparin Code Status: DNR Family Communication: None at bedside Disposition Plan:  Status is: Observation The patient will require care spanning > 2 midnights and should be moved to inpatient because: Due to need for IV fluids and IV antibiotics.   Skin Assessment:  I have examined the patient's skin and I agree with the wound assessment as performed by the wound care RN as outlined below:  Pressure Injury 02/17/22 Sacrum Stage 1 -  Intact skin with non-blanchable redness of a localized area usually over a bony prominence. stage 1 to sacrum measuring 3 x 1 (Active)  02/17/22 2117  Location: Sacrum  Location Orientation:   Staging: Stage 1 -  Intact skin with non-blanchable redness of a localized area usually over a bony prominence.  Wound Description (Comments): stage 1 to sacrum measuring 3 x 1  Present on Admission: Yes    Consultants:  None  Procedures:  None  Antimicrobials:  Anti-infectives (From admission, onward)    Start     Dose/Rate Route Frequency Ordered Stop   02/18/22 2000  azithromycin (ZITHROMAX) 500 mg in sodium chloride 0.9 % 250 mL IVPB        500 mg 250 mL/hr over 60  Minutes Intravenous Every 24 hours 02/17/22 1957 02/23/22 1959   02/18/22 1700  cefTRIAXone (ROCEPHIN) 2 g in sodium chloride 0.9 % 100 mL IVPB        2 g 200 mL/hr over 30 Minutes Intravenous Every 24 hours 02/17/22 1957 02/23/22 1659   02/17/22 2200  cefTRIAXone (ROCEPHIN) 1 g in sodium chloride 0.9 % 100 mL IVPB        1 g 200 mL/hr over 30 Minutes Intravenous  Once 02/17/22 2020 02/17/22 2158   02/17/22  1845  cefTRIAXone (ROCEPHIN) 1 g in sodium chloride 0.9 % 100 mL IVPB        1 g 200 mL/hr over 30 Minutes Intravenous  Once 02/17/22 1841 02/17/22 2019   02/17/22 1845  azithromycin (ZITHROMAX) 500 mg in sodium chloride 0.9 % 250 mL IVPB        500 mg 250 mL/hr over 60 Minutes Intravenous  Once 02/17/22 1841 02/17/22 2107       Subjective: Patient seen and evaluated today with no new acute complaints or concerns. No acute concerns or events noted overnight.  Objective: Vitals:   02/17/22 2111 02/17/22 2123 02/18/22 0022 02/18/22 0512  BP: (!) 153/56  (!) 128/49 (!) 143/49  Pulse: (!) 101  88 84  Resp: '20  20 18  '$ Temp: 99.7 F (37.6 C)  100 F (37.8 C) 98.6 F (37 C)  TempSrc:    Oral  SpO2: 92%  93% 94%  Weight:  84.6 kg    Height:        Intake/Output Summary (Last 24 hours) at 02/18/2022 1009 Last data filed at 02/18/2022 3382 Gross per 24 hour  Intake 2580 ml  Output --  Net 2580 ml   Filed Weights   02/17/22 1605 02/17/22 2123  Weight: 80.7 kg 84.6 kg    Examination:  General exam: Appears calm and comfortable  Respiratory system: Congested bilaterally on 3 L nasal cannula Cardiovascular system: S1 & S2 heard, RRR.  Gastrointestinal system: Abdomen is soft Central nervous system: Alert and awake Extremities: No edema Skin: No significant lesions noted Psychiatry: Flat affect.    Data Reviewed: I have personally reviewed following labs and imaging studies  CBC: Recent Labs  Lab 02/17/22 1622 02/18/22 0554  WBC 15.1* 13.1*  NEUTROABS 12.4* 9.8*  HGB 11.6* 9.7*  HCT 34.8* 29.3*  MCV 86.4 87.5  PLT 293 505   Basic Metabolic Panel: Recent Labs  Lab 02/17/22 1622 02/18/22 0102 02/18/22 0554  NA 123* 126* 126*  K 4.3 3.9 3.9  CL 87* 94* 93*  CO2 '26 25 26  '$ GLUCOSE 169* 149* 135*  BUN '12 10 12  '$ CREATININE 0.72 0.73 0.70  CALCIUM 8.7* 7.7* 7.9*  MG  --   --  1.5*   GFR: Estimated Creatinine Clearance: 55.4 mL/min (by C-G formula based on  SCr of 0.7 mg/dL). Liver Function Tests: Recent Labs  Lab 02/18/22 0554  AST 12*  ALT 11  ALKPHOS 72  BILITOT 0.8  PROT 5.9*  ALBUMIN 2.8*   No results for input(s): "LIPASE", "AMYLASE" in the last 168 hours. No results for input(s): "AMMONIA" in the last 168 hours. Coagulation Profile: No results for input(s): "INR", "PROTIME" in the last 168 hours. Cardiac Enzymes: No results for input(s): "CKTOTAL", "CKMB", "CKMBINDEX", "TROPONINI" in the last 168 hours. BNP (last 3 results) No results for input(s): "PROBNP" in the last 8760 hours. HbA1C: No results for input(s): "HGBA1C" in the last 72 hours. CBG: Recent Labs  Lab 02/17/22 2120 02/18/22 0805  GLUCAP 204* 131*   Lipid Profile: No results for input(s): "CHOL", "HDL", "LDLCALC", "TRIG", "CHOLHDL", "LDLDIRECT" in the last 72 hours. Thyroid Function Tests: Recent Labs    02/18/22 0554  TSH 1.342   Anemia Panel: No results for input(s): "VITAMINB12", "FOLATE", "FERRITIN", "TIBC", "IRON", "RETICCTPCT" in the last 72 hours. Sepsis Labs: Recent Labs  Lab 02/17/22 1910 02/18/22 0102  LATICACIDVEN 0.8 0.7    Recent Results (from the past 240 hour(s))  SARS Coronavirus 2 by RT PCR (hospital order, performed in [facility] hospital lab) *cepheid single result test* Anterior Nasal Swab     Status: None   Collection Time: 02/17/22  6:21 PM   Specimen: Anterior Nasal Swab  Result Value Ref Range Status   SARS Coronavirus 2 by RT PCR NEGATIVE NEGATIVE Final    Comment: (NOTE) SARS-CoV-2 target nucleic acids are NOT DETECTED.  The SARS-CoV-2 RNA is generally detectable in upper and lower respiratory specimens during the acute phase of infection. The lowest concentration of SARS-CoV-2 viral copies this assay can detect is 250 copies / mL. A negative result does not preclude SARS-CoV-2 infection and should not be used as the sole basis for treatment or other patient management decisions.  A negative result may occur  with improper specimen collection / handling, submission of specimen other than nasopharyngeal swab, presence of viral mutation(s) within the areas targeted by this assay, and inadequate number of viral copies (<250 copies / mL). A negative result must be combined with clinical observations, patient history, and epidemiological information.  Fact Sheet for Patients:   https://www.patel.info/  Fact Sheet for Healthcare Providers: https://hall.com/  This test is not yet approved or  cleared by the [place] FDA and has been authorized for detection and/or diagnosis of SARS-CoV-2 by FDA under an Emergency Use Authorization (EUA).  This EUA will remain in effect (meaning this test can be used) for the duration of the COVID-19 declaration under Section 564(b)(1) of the Act, 21 U.S.C. section 360bbb-3(b)(1), unless the authorization is terminated or revoked sooner.  Performed at [facility], [address] [place], [place] [address]   Blood culture (routine x 2)     Status: None (Preliminary result)   Collection Time: 02/17/22  7:10 PM   Specimen: BLOOD RIGHT HAND  Result Value Ref Range Status   Specimen Description BLOOD RIGHT HAND  Final   Special Requests   Final    BOTTLES DRAWN AEROBIC AND ANAEROBIC Blood Culture adequate volume   Culture   Final    NO GROWTH < 12 HOURS Performed at [facility], [address], [place], [place] [address]    Report Status PENDING  Incomplete  Blood culture (routine x 2)     Status: None (Preliminary result)   Collection Time: 02/17/22  7:20 PM   Specimen: BLOOD LEFT HAND  Result Value Ref Range Status   Specimen Description BLOOD LEFT HAND  Final   Special Requests   Final    BOTTLES DRAWN AEROBIC AND ANAEROBIC Blood Culture adequate volume   Culture   Final    NO GROWTH < 12 HOURS Performed at [facility], [address], [place], [place] [address]    Report Status PENDING  Incomplete          Radiology Studies: DG Chest 2 View  Result Date: 02/17/2022 CLINICAL DATA:  Cough, congestion EXAM: CHEST - 2 VIEW COMPARISON:  02/27/2021 FINDINGS: Bilateral mild interstitial thickening. Right middle lobe airspace disease  which may reflect atelectasis versus pneumonia. No pleural effusion or pneumothorax. Heart and mediastinal contours are unremarkable. No acute osseous abnormality. IMPRESSION: 1. Right middle lobe airspace disease which may reflect atelectasis versus pneumonia. Electronically Signed   By: Kathreen Devoid M.D.   On: 02/17/2022 16:54        Scheduled Meds:  amLODipine  10 mg Oral Daily   famotidine  20 mg Oral QHS   gabapentin  100 mg Oral TID   heparin  5,000 Units Subcutaneous Q8H   hydrALAZINE  25 mg Oral BID   insulin aspart  0-15 Units Subcutaneous TID WC   insulin aspart  0-5 Units Subcutaneous QHS   losartan  100 mg Oral Daily   olopatadine  1 drop Both Eyes BID   pantoprazole  40 mg Oral Daily   Continuous Infusions:  sodium chloride     azithromycin     cefTRIAXone (ROCEPHIN)  IV       LOS: 0 days    Time spent: 35 minutes    Josaiah Muhammed Darleen Crocker, DO [facility]  If 7PM-7AM, please contact night-coverage [email or web address] 02/18/2022, 10:09 AM

## 2022-02-19 DIAGNOSIS — J189 Pneumonia, unspecified organism: Secondary | ICD-10-CM | POA: Diagnosis not present

## 2022-02-19 LAB — BASIC METABOLIC PANEL
Anion gap: 6 (ref 5–15)
BUN: 10 mg/dL (ref 8–23)
CO2: 27 mmol/L (ref 22–32)
Calcium: 7.9 mg/dL — ABNORMAL LOW (ref 8.9–10.3)
Chloride: 95 mmol/L — ABNORMAL LOW (ref 98–111)
Creatinine, Ser: 0.67 mg/dL (ref 0.44–1.00)
GFR, Estimated: >60 mL/min (ref >60)
Glucose, Bld: 149 mg/dL — ABNORMAL HIGH (ref 70–99)
Potassium: 3.7 mmol/L (ref 3.5–5.1)
Sodium: 128 mmol/L — ABNORMAL LOW (ref 135–145)

## 2022-02-19 LAB — GLUCOSE, CAPILLARY
Glucose-Capillary: 149 mg/dL — ABNORMAL HIGH (ref 70–99)
Glucose-Capillary: 177 mg/dL — ABNORMAL HIGH (ref 70–99)
Glucose-Capillary: 130 mg/dL — ABNORMAL HIGH (ref 70–99)
Glucose-Capillary: 131 mg/dL — ABNORMAL HIGH (ref 70–99)

## 2022-02-19 LAB — CBC
HCT: 29.3 % — ABNORMAL LOW (ref 36.0–46.0)
Hemoglobin: 9.7 g/dL — ABNORMAL LOW (ref 12.0–15.0)
MCH: 28.8 pg (ref 26.0–34.0)
MCHC: 33.1 g/dL (ref 30.0–36.0)
MCV: 86.9 fL (ref 80.0–100.0)
Platelets: 262 10*3/uL (ref 150–400)
RBC: 3.37 MIL/uL — ABNORMAL LOW (ref 3.87–5.11)
RDW: 13.3 % (ref 11.5–15.5)
WBC: 8.9 10*3/uL (ref 4.0–10.5)
nRBC: 0 % (ref 0.0–0.2)

## 2022-02-19 LAB — MAGNESIUM: Magnesium: 1.9 mg/dL (ref 1.7–2.4)

## 2022-02-19 LAB — LEGIONELLA PNEUMOPHILA SEROGP 1 UR AG: L. pneumophila Serogp 1 Ur Ag: NEGATIVE

## 2022-02-19 MED ORDER — BUDESONIDE 0.25 MG/2ML IN SUSP
RESPIRATORY_TRACT | Status: AC
  Administered 2022-02-19: 0.25 mg
  Filled 2022-02-19: qty 2

## 2022-02-19 MED ORDER — IPRATROPIUM-ALBUTEROL 0.5-2.5 (3) MG/3ML IN SOLN
3.0000 mL | Freq: Four times a day (QID) | RESPIRATORY_TRACT | Status: DC | PRN
  Administered 2022-02-23: 3 mL via RESPIRATORY_TRACT
  Filled 2022-02-19: qty 3

## 2022-02-19 MED ORDER — GUAIFENESIN-DM 100-10 MG/5ML PO SYRP
15.0000 mL | ORAL_SOLUTION | ORAL | Status: DC | PRN
  Administered 2022-02-19 – 2022-02-21 (×5): 15 mL via ORAL
  Filled 2022-02-19 (×5): qty 15

## 2022-02-19 MED ORDER — ORAL CARE MOUTH RINSE: 15.0000 mL | OROMUCOSAL | Status: DC | PRN

## 2022-02-19 MED ORDER — BUDESONIDE 0.25 MG/2ML IN SUSP
0.2500 mg | Freq: Two times a day (BID) | RESPIRATORY_TRACT | Status: DC
  Administered 2022-02-19 – 2022-02-23 (×8): 0.25 mg via RESPIRATORY_TRACT
  Filled 2022-02-19 (×8): qty 2

## 2022-02-19 MED ORDER — SODIUM CHLORIDE 1 G PO TABS
1.0000 g | ORAL_TABLET | Freq: Three times a day (TID) | ORAL | Status: DC
  Administered 2022-02-19 – 2022-02-20 (×3): 1 g via ORAL
  Filled 2022-02-19 (×3): qty 1

## 2022-02-19 MED ORDER — SODIUM CHLORIDE 0.9 % IV SOLN: 500.0000 mg | INTRAVENOUS | Status: DC

## 2022-02-19 NOTE — TOC Progression Note (Signed)
Transition of Care [facility]) - Progression Note    Patient Details  Name: [name] MRN: [id number] Date of Birth: [birth year]  Transition of Care [facility]) CM/SW [place], [place] Phone Number: 02/19/2022, 10:05 AM  Clinical Narrative:    CSW spoke with Erline Levine at [facility] who states that pt is mostly independent in their facility. She does not use any DME. Pt does not wear home O2, if this is needed at D/C it can be ordered through [facility]. TOC to follow.   Expected Discharge Plan: Assisted Living Barriers to Discharge: Continued Medical Work up  Expected Discharge Plan and Services Expected Discharge Plan: Assisted Living                                               Social Determinants of Health (SDOH) Interventions    Readmission Risk Interventions     No data to display

## 2022-02-19 NOTE — Progress Notes (Signed)
PROGRESS NOTE    [name]  UTM:[id number] DOB: [birth year] DOA: 02/17/2022 PCP: Kathyrn Drown, MD   Brief Narrative:    [name] is a 88 y.o. female with medical history significant of dementia, diabetes mellitus type 2, hypertension, GERD, and more presents the ED with a chief complaint of dyspnea.  Patient was admitted with community-acquired pneumonia to the right middle lobe along with associated acute hypoxemic respiratory failure.  She is also noted to have hyponatremia likely due to her poor p.o. intake recently.  Assessment & Plan:   Principal Problem:   Pneumonia Active Problems:   Controlled type 2 diabetes mellitus without complication, without long-term current use of insulin (HCC)   Essential hypertension   Hyponatremia   Generalized anxiety disorder   Acute respiratory failure with hypoxia (HCC)   GERD (gastroesophageal reflux disease)  Assessment and Plan:   Pneumonia - X-ray findings suggesting pneumonia of the right middle lobe on chest x-ray as well as leukocytosis and cough -Started on Rocephin and Zithromax - Continue same antibiotics - Expectorated sputum culture - Blood cultures negative thus far - Strep and Legionella urine antigens - Tessalon for cough and Robitussin added - Curb 65 score = 1 - low risk - Continue to monitor   GERD (gastroesophageal reflux disease) - Continue Pepcid and Protonix   Acute respiratory failure with hypoxia (HCC) - Oxygen sats down to 88% - Requiring 3 L nasal cannula to maintain saturations - No oxygen at home - Secondary to pneumonia - Albuterol as needed - Wean off O2 as tolerated - Continue to monitor   Generalized anxiety disorder - Continue Xanax   Hyponatremia-improving - Possibly SIADH, start fluid restriction and add salt tablets - Patient has not been feeling well, likely due to poor p.o. intake - 2 L normal saline bolus in the ED, so before adding more fluids rechecking BMP -  Continue to monitor -TSH 1.34   Essential hypertension - Continue hydralazine, losartan, Norvasc - Continue to monitor   Controlled type 2 diabetes mellitus without complication, without long-term current use of insulin (HCC) - Last hemoglobin A1c was at goal 7.0 - Hold oral hypoglycemics - Sliding scale coverage - Continue to monitor   DVT prophylaxis: Heparin Code Status: DNR Family Communication: None at bedside Disposition Plan:  Status is: Inpatient Remains inpatient appropriate because: Need for IV medications.  Skin Assessment:   I have examined the patient's skin and I agree with the wound assessment as performed by the wound care RN as outlined below:   Pressure Injury 02/17/22 Sacrum Stage 1 -  Intact skin with non-blanchable redness of a localized area usually over a bony prominence. stage 1 to sacrum measuring 3 x 1 (Active)  02/17/22 2117  Location: Sacrum  Location Orientation:   Staging: Stage 1 -  Intact skin with non-blanchable redness of a localized area usually over a bony prominence.  Wound Description (Comments): stage 1 to sacrum measuring 3 x 1  Present on Admission: Yes      Consultants:  None   Procedures:  None   Antimicrobials:  Anti-infectives (From admission, onward)    Start     Dose/Rate Route Frequency Ordered Stop   02/19/22 1115  azithromycin (ZITHROMAX) 500 mg in sodium chloride 0.9 % 250 mL IVPB        500 mg 250 mL/hr over 60 Minutes Intravenous Every 24 hours 02/19/22 1022 02/22/22 1114   02/18/22 2000  azithromycin (ZITHROMAX) 500 mg in sodium  chloride 0.9 % 250 mL IVPB        500 mg 250 mL/hr over 60 Minutes Intravenous Every 24 hours 02/17/22 1957 02/23/22 1959   02/18/22 1700  cefTRIAXone (ROCEPHIN) 2 g in sodium chloride 0.9 % 100 mL IVPB        2 g 200 mL/hr over 30 Minutes Intravenous Every 24 hours 02/17/22 1957 02/23/22 1659   02/17/22 2200  cefTRIAXone (ROCEPHIN) 1 g in sodium chloride 0.9 % 100 mL IVPB        1  g 200 mL/hr over 30 Minutes Intravenous  Once 02/17/22 2020 02/17/22 2158   02/17/22 1845  cefTRIAXone (ROCEPHIN) 1 g in sodium chloride 0.9 % 100 mL IVPB        1 g 200 mL/hr over 30 Minutes Intravenous  Once 02/17/22 1841 02/17/22 2019   02/17/22 1845  azithromycin (ZITHROMAX) 500 mg in sodium chloride 0.9 % 250 mL IVPB        500 mg 250 mL/hr over 60 Minutes Intravenous  Once 02/17/22 1841 02/17/22 2107       Subjective: Patient seen and evaluated today and feels sore throughout her body with ongoing cough noted.  She states that she has a poor appetite and was noted to have a mild fever overnight.  Objective: Vitals:   02/18/22 2053 02/19/22 0200 02/19/22 0442 02/19/22 0710  BP: (!) 145/57  (!) 159/77   Pulse: 95  (!) 51   Resp: 20  18   Temp: 99.4 F (37.4 C)  (!) 100.5 F (38.1 C)   TempSrc: Oral  Oral   SpO2: 96% 92% 92% 94%  Weight:      Height:        Intake/Output Summary (Last 24 hours) at 02/19/2022 1023 Last data filed at 02/19/2022 0235 Gross per 24 hour  Intake 2225.82 ml  Output --  Net 2225.82 ml   Filed Weights   02/17/22 1605 02/17/22 2123  Weight: 80.7 kg 84.6 kg    Examination:  General exam: Appears calm and comfortable  Respiratory system: Clear to auscultation. Respiratory effort normal.  2 L nasal cannula. Cardiovascular system: S1 & S2 heard, RRR.  Gastrointestinal system: Abdomen is soft Central nervous system: Alert and awake Extremities: No edema Skin: No significant lesions noted Psychiatry: Flat affect.    Data Reviewed: I have personally reviewed following labs and imaging studies  CBC: Recent Labs  Lab 02/17/22 1622 02/18/22 0554 02/19/22 0421  WBC 15.1* 13.1* 8.9  NEUTROABS 12.4* 9.8*  --   HGB 11.6* 9.7* 9.7*  HCT 34.8* 29.3* 29.3*  MCV 86.4 87.5 86.9  PLT 293 257 096   Basic Metabolic Panel: Recent Labs  Lab 02/17/22 1622 02/18/22 0102 02/18/22 0554 02/19/22 0421  NA 123* 126* 126* 128*  K 4.3 3.9 3.9 3.7   CL 87* 94* 93* 95*  CO2 '26 25 26 27  '$ GLUCOSE 169* 149* 135* 149*  BUN '12 10 12 10  '$ CREATININE 0.72 0.73 0.70 0.67  CALCIUM 8.7* 7.7* 7.9* 7.9*  MG  --   --  1.5* 1.9   GFR: Estimated Creatinine Clearance: 55.4 mL/min (by C-G formula based on SCr of 0.67 mg/dL). Liver Function Tests: Recent Labs  Lab 02/18/22 0554  AST 12*  ALT 11  ALKPHOS 72  BILITOT 0.8  PROT 5.9*  ALBUMIN 2.8*   No results for input(s): "LIPASE", "AMYLASE" in the last 168 hours. No results for input(s): "AMMONIA" in the last 168 hours. Coagulation Profile: No  results for input(s): "INR", "PROTIME" in the last 168 hours. Cardiac Enzymes: No results for input(s): "CKTOTAL", "CKMB", "CKMBINDEX", "TROPONINI" in the last 168 hours. BNP (last 3 results) No results for input(s): "PROBNP" in the last 8760 hours. HbA1C: No results for input(s): "HGBA1C" in the last 72 hours. CBG: Recent Labs  Lab 02/18/22 0805 02/18/22 1130 02/18/22 1630 02/18/22 2155 02/19/22 0707  GLUCAP 131* 146* 134* 176* 149*   Lipid Profile: No results for input(s): "CHOL", "HDL", "LDLCALC", "TRIG", "CHOLHDL", "LDLDIRECT" in the last 72 hours. Thyroid Function Tests: Recent Labs    02/18/22 0554  TSH 1.342   Anemia Panel: No results for input(s): "VITAMINB12", "FOLATE", "FERRITIN", "TIBC", "IRON", "RETICCTPCT" in the last 72 hours. Sepsis Labs: Recent Labs  Lab 02/17/22 1910 02/18/22 0102  LATICACIDVEN 0.8 0.7    Recent Results (from the past 240 hour(s))  SARS Coronavirus 2 by RT PCR (hospital order, performed in [facility] hospital lab) *cepheid single result test* Anterior Nasal Swab     Status: None   Collection Time: 02/17/22  6:21 PM   Specimen: Anterior Nasal Swab  Result Value Ref Range Status   SARS Coronavirus 2 by RT PCR NEGATIVE NEGATIVE Final    Comment: (NOTE) SARS-CoV-2 target nucleic acids are NOT DETECTED.  The SARS-CoV-2 RNA is generally detectable in upper and lower respiratory specimens during  the acute phase of infection. The lowest concentration of SARS-CoV-2 viral copies this assay can detect is 250 copies / mL. A negative result does not preclude SARS-CoV-2 infection and should not be used as the sole basis for treatment or other patient management decisions.  A negative result may occur with improper specimen collection / handling, submission of specimen other than nasopharyngeal swab, presence of viral mutation(s) within the areas targeted by this assay, and inadequate number of viral copies (<250 copies / mL). A negative result must be combined with clinical observations, patient history, and epidemiological information.  Fact Sheet for Patients:   https://www.patel.info/  Fact Sheet for Healthcare Providers: https://hall.com/  This test is not yet approved or  cleared by the [place] FDA and has been authorized for detection and/or diagnosis of SARS-CoV-2 by FDA under an Emergency Use Authorization (EUA).  This EUA will remain in effect (meaning this test can be used) for the duration of the COVID-19 declaration under Section 564(b)(1) of the Act, 21 U.S.C. section 360bbb-3(b)(1), unless the authorization is terminated or revoked sooner.  Performed at [facility], [address], [place], [place] [address]   Blood culture (routine x 2)     Status: None (Preliminary result)   Collection Time: 02/17/22  7:10 PM   Specimen: BLOOD RIGHT HAND  Result Value Ref Range Status   Specimen Description BLOOD RIGHT HAND  Final   Special Requests   Final    BOTTLES DRAWN AEROBIC AND ANAEROBIC Blood Culture adequate volume   Culture   Final    NO GROWTH 2 DAYS Performed at [facility], [address], [place], [place] [address]    Report Status PENDING  Incomplete  Blood culture (routine x 2)     Status: None (Preliminary result)   Collection Time: 02/17/22  7:20 PM   Specimen: BLOOD LEFT HAND  Result Value Ref Range Status    Specimen Description BLOOD LEFT HAND  Final   Special Requests   Final    BOTTLES DRAWN AEROBIC AND ANAEROBIC Blood Culture adequate volume   Culture   Final    NO GROWTH 2  DAYS Performed at [facility], [address], [place], [place] [address]    Report Status PENDING  Incomplete         Radiology Studies: DG Chest 2 View  Result Date: 02/17/2022 CLINICAL DATA:  Cough, congestion EXAM: CHEST - 2 VIEW COMPARISON:  02/27/2021 FINDINGS: Bilateral mild interstitial thickening. Right middle lobe airspace disease which may reflect atelectasis versus pneumonia. No pleural effusion or pneumothorax. Heart and mediastinal contours are unremarkable. No acute osseous abnormality. IMPRESSION: 1. Right middle lobe airspace disease which may reflect atelectasis versus pneumonia. Electronically Signed   By: Kathreen Devoid M.D.   On: 02/17/2022 16:54        Scheduled Meds:  amLODipine  10 mg Oral Daily   budesonide (PULMICORT) nebulizer solution  0.25 mg Nebulization BID   famotidine  20 mg Oral QHS   gabapentin  100 mg Oral TID   heparin  5,000 Units Subcutaneous Q8H   hydrALAZINE  25 mg Oral BID   insulin aspart  0-15 Units Subcutaneous TID WC   insulin aspart  0-5 Units Subcutaneous QHS   losartan  100 mg Oral Daily   olopatadine  1 drop Both Eyes BID   pantoprazole  40 mg Oral Daily   Continuous Infusions:  azithromycin Stopped (02/18/22 2055)   azithromycin     cefTRIAXone (ROCEPHIN)  IV Stopped (02/18/22 1755)     LOS: 1 day    Time spent: 35 minutes    [name] Darleen Crocker, DO [facility]  If 7PM-7AM, please contact night-coverage [email or web address] 02/19/2022, 10:23 AM

## 2022-02-20 DIAGNOSIS — J189 Pneumonia, unspecified organism: Secondary | ICD-10-CM | POA: Diagnosis not present

## 2022-02-20 LAB — BASIC METABOLIC PANEL
Anion gap: 7 (ref 5–15)
BUN: 7 mg/dL — ABNORMAL LOW (ref 8–23)
CO2: 29 mmol/L (ref 22–32)
Calcium: 8.5 mg/dL — ABNORMAL LOW (ref 8.9–10.3)
Chloride: 97 mmol/L — ABNORMAL LOW (ref 98–111)
Creatinine, Ser: 0.61 mg/dL (ref 0.44–1.00)
GFR, Estimated: >60 mL/min (ref >60)
Glucose, Bld: 144 mg/dL — ABNORMAL HIGH (ref 70–99)
Potassium: 3.5 mmol/L (ref 3.5–5.1)
Sodium: 133 mmol/L — ABNORMAL LOW (ref 135–145)

## 2022-02-20 LAB — GLUCOSE, CAPILLARY
Glucose-Capillary: 160 mg/dL — ABNORMAL HIGH (ref 70–99)
Glucose-Capillary: 184 mg/dL — ABNORMAL HIGH (ref 70–99)
Glucose-Capillary: 164 mg/dL — ABNORMAL HIGH (ref 70–99)
Glucose-Capillary: 134 mg/dL — ABNORMAL HIGH (ref 70–99)

## 2022-02-20 LAB — MAGNESIUM: Magnesium: 1.8 mg/dL (ref 1.7–2.4)

## 2022-02-20 MED ORDER — HYDROCOD POLI-CHLORPHE POLI ER 10-8 MG/5ML PO SUER
5.0000 mL | Freq: Two times a day (BID) | ORAL | Status: DC
  Administered 2022-02-20 – 2022-02-23 (×7): 5 mL via ORAL
  Filled 2022-02-20 (×7): qty 5

## 2022-02-20 MED ORDER — SACCHAROMYCES BOULARDII 250 MG PO CAPS
250.0000 mg | ORAL_CAPSULE | Freq: Two times a day (BID) | ORAL | Status: DC
  Administered 2022-02-20 – 2022-02-23 (×7): 250 mg via ORAL
  Filled 2022-02-20 (×7): qty 1

## 2022-02-20 MED ORDER — GUAIFENESIN ER 600 MG PO TB12
600.0000 mg | ORAL_TABLET | Freq: Two times a day (BID) | ORAL | Status: DC
  Administered 2022-02-20 – 2022-02-23 (×7): 600 mg via ORAL
  Filled 2022-02-20 (×7): qty 1

## 2022-02-20 NOTE — Progress Notes (Signed)
PROGRESS NOTE    [name]  GEX:[id number] DOB: March 12, 1932 DOA: 02/17/2022 PCP: Kathyrn Drown, MD   Brief Narrative:    [name] is a 88 y.o. female with medical history significant of dementia, diabetes mellitus type 2, hypertension, GERD, and more presents the ED with a chief complaint of dyspnea.  Patient was admitted with community-acquired pneumonia to the right middle lobe along with associated acute hypoxemic respiratory failure.  She is also noted to have hyponatremia likely due to her poor p.o. intake recently.  Assessment & Plan:   Principal Problem:   Pneumonia Active Problems:   Controlled type 2 diabetes mellitus without complication, without long-term current use of insulin (HCC)   Essential hypertension   Hyponatremia   Generalized anxiety disorder   Acute respiratory failure with hypoxia (HCC)   GERD (gastroesophageal reflux disease)  Assessment and Plan:   Pneumonia - X-ray findings suggesting pneumonia of the right middle lobe on chest x-ray as well as leukocytosis and cough -Started on Rocephin and Zithromax - Continue same antibiotics - Expectorated sputum culture - Blood cultures negative thus far - Strep and Legionella urine antigens negative - Tessalon for cough and Robitussin added - Curb 65 score = 1 - low risk - Continue to monitor   GERD (gastroesophageal reflux disease) - Continue Pepcid and Protonix   Acute respiratory failure with hypoxia (HCC) - Oxygen sats down to 88% - Requiring 3 L nasal cannula to maintain saturations - No oxygen at home - Secondary to pneumonia - Albuterol as needed - Wean off O2 as tolerated - Continue to monitor   Generalized anxiety disorder - Continue Xanax   Hyponatremia-improving - Possibly SIADH, start fluid restriction and add salt tablets - Patient has not been feeling well, likely due to poor p.o. intake - 2 L normal saline bolus in the ED, so before adding more fluids rechecking  BMP - Continue to monitor -TSH 1.34   Essential hypertension - Continue hydralazine, losartan, Norvasc - Continue to monitor   Controlled type 2 diabetes mellitus without complication, without long-term current use of insulin (HCC) - Last hemoglobin A1c was at goal 7.0 - Hold oral hypoglycemics - Sliding scale coverage - Continue to monitor   DVT prophylaxis: Heparin Code Status: DNR Family Communication: None at bedside Disposition Plan:  Status is: Inpatient Remains inpatient appropriate because: Need for IV medications.   Skin Assessment:   I have examined the patient's skin and I agree with the wound assessment as performed by the wound care RN as outlined below:   Pressure Injury 02/17/22 Sacrum Stage 1 -  Intact skin with non-blanchable redness of a localized area usually over a bony prominence. stage 1 to sacrum measuring 3 x 1 (Active)  02/17/22 2117  Location: Sacrum  Location Orientation:   Staging: Stage 1 -  Intact skin with non-blanchable redness of a localized area usually over a bony prominence.  Wound Description (Comments): stage 1 to sacrum measuring 3 x 1  Present on Admission: Yes      Consultants:  None   Procedures:  None    Antimicrobials:  Anti-infectives (From admission, onward)    Start     Dose/Rate Route Frequency Ordered Stop   02/19/22 1115  azithromycin (ZITHROMAX) 500 mg in sodium chloride 0.9 % 250 mL IVPB  Status:  Discontinued        500 mg 250 mL/hr over 60 Minutes Intravenous Every 24 hours 02/19/22 1022 02/19/22 1027   02/18/22 2000  azithromycin (ZITHROMAX) 500 mg in sodium chloride 0.9 % 250 mL IVPB        500 mg 250 mL/hr over 60 Minutes Intravenous Every 24 hours 02/17/22 1957 02/23/22 1959   02/18/22 1700  cefTRIAXone (ROCEPHIN) 2 g in sodium chloride 0.9 % 100 mL IVPB        2 g 200 mL/hr over 30 Minutes Intravenous Every 24 hours 02/17/22 1957 02/23/22 1659   02/17/22 2200  cefTRIAXone (ROCEPHIN) 1 g in sodium chloride  0.9 % 100 mL IVPB        1 g 200 mL/hr over 30 Minutes Intravenous  Once 02/17/22 2020 02/17/22 2158   02/17/22 1845  cefTRIAXone (ROCEPHIN) 1 g in sodium chloride 0.9 % 100 mL IVPB        1 g 200 mL/hr over 30 Minutes Intravenous  Once 02/17/22 1841 02/17/22 2019   02/17/22 1845  azithromycin (ZITHROMAX) 500 mg in sodium chloride 0.9 % 250 mL IVPB        500 mg 250 mL/hr over 60 Minutes Intravenous  Once 02/17/22 1841 02/17/22 2107       Subjective: Patient seen and evaluated today and continues to feel fatigued and sore with ongoing cough noted.  She is not feeling very well and does not have much of an appetite today.  Objective: Vitals:   02/19/22 1945 02/19/22 2045 02/20/22 0450 02/20/22 0704  BP:  (!) 139/46 (!) 155/54   Pulse:  82 86   Resp:  19 19   Temp:  98.5 F (36.9 C) 98.2 F (36.8 C)   TempSrc:  Oral Oral   SpO2: 94% 95% 94% 95%  Weight:      Height:        Intake/Output Summary (Last 24 hours) at 02/20/2022 0941 Last data filed at 02/20/2022 0500 Gross per 24 hour  Intake 720 ml  Output --  Net 720 ml   Filed Weights   02/17/22 1605 02/17/22 2123  Weight: 80.7 kg 84.6 kg    Examination:  General exam: Appears calm and comfortable  Respiratory system: Clear to auscultation. Respiratory effort normal.  Currently on nasal cannula oxygen. Cardiovascular system: S1 & S2 heard, RRR.  Gastrointestinal system: Abdomen is soft Central nervous system: Alert and awake Extremities: No edema Skin: No significant lesions noted Psychiatry: Flat affect.    Data Reviewed: I have personally reviewed following labs and imaging studies  CBC: Recent Labs  Lab 02/17/22 1622 02/18/22 0554 02/19/22 0421  WBC 15.1* 13.1* 8.9  NEUTROABS 12.4* 9.8*  --   HGB 11.6* 9.7* 9.7*  HCT 34.8* 29.3* 29.3*  MCV 86.4 87.5 86.9  PLT 293 257 017   Basic Metabolic Panel: Recent Labs  Lab 02/17/22 1622 02/18/22 0102 02/18/22 0554 02/19/22 0421 02/20/22 0605  NA 123*  126* 126* 128* 133*  K 4.3 3.9 3.9 3.7 3.5  CL 87* 94* 93* 95* 97*  CO2 '26 25 26 27 29  '$ GLUCOSE 169* 149* 135* 149* 144*  BUN '12 10 12 10 '$ 7*  CREATININE 0.72 0.73 0.70 0.67 0.61  CALCIUM 8.7* 7.7* 7.9* 7.9* 8.5*  MG  --   --  1.5* 1.9 1.8   GFR: Estimated Creatinine Clearance: 55.4 mL/min (by C-G formula based on SCr of 0.61 mg/dL). Liver Function Tests: Recent Labs  Lab 02/18/22 0554  AST 12*  ALT 11  ALKPHOS 72  BILITOT 0.8  PROT 5.9*  ALBUMIN 2.8*   No results for input(s): "LIPASE", "AMYLASE" in the last  168 hours. No results for input(s): "AMMONIA" in the last 168 hours. Coagulation Profile: No results for input(s): "INR", "PROTIME" in the last 168 hours. Cardiac Enzymes: No results for input(s): "CKTOTAL", "CKMB", "CKMBINDEX", "TROPONINI" in the last 168 hours. BNP (last 3 results) No results for input(s): "PROBNP" in the last 8760 hours. HbA1C: No results for input(s): "HGBA1C" in the last 72 hours. CBG: Recent Labs  Lab 02/19/22 0707 02/19/22 1108 02/19/22 1705 02/19/22 2110 02/20/22 0729  GLUCAP 149* 177* 130* 131* 160*   Lipid Profile: No results for input(s): "CHOL", "HDL", "LDLCALC", "TRIG", "CHOLHDL", "LDLDIRECT" in the last 72 hours. Thyroid Function Tests: Recent Labs    02/18/22 0554  TSH 1.342   Anemia Panel: No results for input(s): "VITAMINB12", "FOLATE", "FERRITIN", "TIBC", "IRON", "RETICCTPCT" in the last 72 hours. Sepsis Labs: Recent Labs  Lab 02/17/22 1910 02/18/22 0102  LATICACIDVEN 0.8 0.7    Recent Results (from the past 240 hour(s))  SARS Coronavirus 2 by RT PCR (hospital order, performed in [facility] hospital lab) *cepheid single result test* Anterior Nasal Swab     Status: None   Collection Time: 02/17/22  6:21 PM   Specimen: Anterior Nasal Swab  Result Value Ref Range Status   SARS Coronavirus 2 by RT PCR NEGATIVE NEGATIVE Final    Comment: (NOTE) SARS-CoV-2 target nucleic acids are NOT DETECTED.  The SARS-CoV-2 RNA  is generally detectable in upper and lower respiratory specimens during the acute phase of infection. The lowest concentration of SARS-CoV-2 viral copies this assay can detect is 250 copies / mL. A negative result does not preclude SARS-CoV-2 infection and should not be used as the sole basis for treatment or other patient management decisions.  A negative result may occur with improper specimen collection / handling, submission of specimen other than nasopharyngeal swab, presence of viral mutation(s) within the areas targeted by this assay, and inadequate number of viral copies (<250 copies / mL). A negative result must be combined with clinical observations, patient history, and epidemiological information.  Fact Sheet for Patients:   https://www.patel.info/  Fact Sheet for Healthcare Providers: https://hall.com/  This test is not yet approved or  cleared by the [place] FDA and has been authorized for detection and/or diagnosis of SARS-CoV-2 by FDA under an Emergency Use Authorization (EUA).  This EUA will remain in effect (meaning this test can be used) for the duration of the COVID-19 declaration under Section 564(b)(1) of the Act, 21 U.S.C. section 360bbb-3(b)(1), unless the authorization is terminated or revoked sooner.  Performed at [facility], [address] [place], [place] [address]   Blood culture (routine x 2)     Status: None (Preliminary result)   Collection Time: 02/17/22  7:10 PM   Specimen: BLOOD RIGHT HAND  Result Value Ref Range Status   Specimen Description BLOOD RIGHT HAND  Final   Special Requests   Final    BOTTLES DRAWN AEROBIC AND ANAEROBIC Blood Culture adequate volume   Culture   Final    NO GROWTH 3 DAYS Performed at [facility], [address], [place], [place] [address]    Report Status PENDING  Incomplete  Blood culture (routine x 2)     Status: None (Preliminary result)   Collection Time:  02/17/22  7:20 PM   Specimen: BLOOD LEFT HAND  Result Value Ref Range Status   Specimen Description BLOOD LEFT HAND  Final   Special Requests   Final    BOTTLES DRAWN AEROBIC AND ANAEROBIC Blood  Culture adequate volume   Culture   Final    NO GROWTH 3 DAYS Performed at [facility], [address], [place], [place] [address]    Report Status PENDING  Incomplete         Radiology Studies: No results found.      Scheduled Meds:  amLODipine  10 mg Oral Daily   budesonide (PULMICORT) nebulizer solution  0.25 mg Nebulization BID   chlorpheniramine-HYDROcodone  5 mL Oral Q12H   famotidine  20 mg Oral QHS   gabapentin  100 mg Oral TID   guaiFENesin  600 mg Oral BID   heparin  5,000 Units Subcutaneous Q8H   hydrALAZINE  25 mg Oral BID   insulin aspart  0-15 Units Subcutaneous TID WC   insulin aspart  0-5 Units Subcutaneous QHS   losartan  100 mg Oral Daily   olopatadine  1 drop Both Eyes BID   pantoprazole  40 mg Oral Daily   saccharomyces boulardii  250 mg Oral BID   Continuous Infusions:  azithromycin 500 mg (02/19/22 1935)   cefTRIAXone (ROCEPHIN)  IV 2 g (02/19/22 1746)     LOS: 2 days    Time spent: 35 minutes    Oneil Behney Darleen Crocker, DO [facility]  If 7PM-7AM, please contact night-coverage [email or web address] 02/20/2022, 9:41 AM

## 2022-02-21 DIAGNOSIS — J189 Pneumonia, unspecified organism: Secondary | ICD-10-CM | POA: Diagnosis not present

## 2022-02-21 LAB — BASIC METABOLIC PANEL
Anion gap: 7 (ref 5–15)
BUN: 9 mg/dL (ref 8–23)
CO2: 28 mmol/L (ref 22–32)
Calcium: 8.5 mg/dL — ABNORMAL LOW (ref 8.9–10.3)
Chloride: 99 mmol/L (ref 98–111)
Creatinine, Ser: 0.68 mg/dL (ref 0.44–1.00)
GFR, Estimated: >60 mL/min (ref >60)
Glucose, Bld: 140 mg/dL — ABNORMAL HIGH (ref 70–99)
Potassium: 3.6 mmol/L (ref 3.5–5.1)
Sodium: 134 mmol/L — ABNORMAL LOW (ref 135–145)

## 2022-02-21 LAB — CBC
HCT: 30.5 % — ABNORMAL LOW (ref 36.0–46.0)
Hemoglobin: 9.7 g/dL — ABNORMAL LOW (ref 12.0–15.0)
MCH: 28.5 pg (ref 26.0–34.0)
MCHC: 31.8 g/dL (ref 30.0–36.0)
MCV: 89.7 fL (ref 80.0–100.0)
Platelets: 319 10*3/uL (ref 150–400)
RBC: 3.4 MIL/uL — ABNORMAL LOW (ref 3.87–5.11)
RDW: 13.4 % (ref 11.5–15.5)
WBC: 10 10*3/uL (ref 4.0–10.5)
nRBC: 0 % (ref 0.0–0.2)

## 2022-02-21 LAB — GLUCOSE, CAPILLARY
Glucose-Capillary: 147 mg/dL — ABNORMAL HIGH (ref 70–99)
Glucose-Capillary: 154 mg/dL — ABNORMAL HIGH (ref 70–99)
Glucose-Capillary: 157 mg/dL — ABNORMAL HIGH (ref 70–99)
Glucose-Capillary: 145 mg/dL — ABNORMAL HIGH (ref 70–99)

## 2022-02-21 LAB — MAGNESIUM: Magnesium: 1.7 mg/dL (ref 1.7–2.4)

## 2022-02-21 NOTE — Progress Notes (Signed)
PROGRESS NOTE    KATHRINE [name]  NOM:[id number] DOB: December 24, 1931 DOA: 02/17/2022 PCP: Linda Drown, MD   Brief Narrative:  [name] is a 88 y.o. female with medical history significant of dementia, diabetes mellitus type 2, hypertension, GERD, and more presents the ED with a chief complaint of dyspnea.  Patient was admitted with community-acquired pneumonia to the right middle lobe along with associated acute hypoxemic respiratory failure.  She is also noted to have hyponatremia likely due to her poor p.o. intake recently.  Assessment & Plan:   Principal Problem:   Pneumonia Active Problems:   Controlled type 2 diabetes mellitus without complication, without long-term current use of insulin (HCC)   Essential hypertension   Hyponatremia   Generalized anxiety disorder   Acute respiratory failure with hypoxia (HCC)   GERD (gastroesophageal reflux disease)  Assessment and Plan:   Pneumonia - X-ray findings suggesting pneumonia of the right middle lobe on chest x-ray as well as leukocytosis and cough -Started on Rocephin and Zithromax - Continue same antibiotics - Expectorated sputum culture - Blood cultures negative thus far - Strep and Legionella urine antigens negative - Cough improving with stronger cough syrup - Curb 65 score = 1 - low risk - Continue to monitor   GERD (gastroesophageal reflux disease) - Continue Pepcid and Protonix   Acute respiratory failure with hypoxia (HCC) - Oxygen sats down to 88% - Requiring 3 L nasal cannula to maintain saturations - No oxygen at home - Secondary to pneumonia - Albuterol as needed - Wean off O2 as tolerated - Continue to monitor   Generalized anxiety disorder - Continue Xanax   Hyponatremia-improving - Possibly SIADH, start fluid restriction and add salt tablets - Patient has not been feeling well, likely due to poor p.o. intake - 2 L normal saline bolus in the ED, so before adding more fluids rechecking BMP -  Continue to monitor -TSH 1.34   Essential hypertension - Continue hydralazine, losartan, Norvasc - Continue to monitor   Controlled type 2 diabetes mellitus without complication, without long-term current use of insulin (HCC) - Last hemoglobin A1c was at goal 7.0 - Hold oral hypoglycemics - Sliding scale coverage - Continue to monitor   DVT prophylaxis: Heparin Code Status: DNR Family Communication: None at bedside Disposition Plan:  Status is: Inpatient Remains inpatient appropriate because: Need for IV medications.   Skin Assessment:   I have examined the patient's skin and I agree with the wound assessment as performed by the wound care RN as outlined below:   Pressure Injury 02/17/22 Sacrum Stage 1 -  Intact skin with non-blanchable redness of a localized area usually over a bony prominence. stage 1 to sacrum measuring 3 x 1 (Active)  02/17/22 2117  Location: Sacrum  Location Orientation:   Staging: Stage 1 -  Intact skin with non-blanchable redness of a localized area usually over a bony prominence.  Wound Description (Comments): stage 1 to sacrum measuring 3 x 1  Present on Admission: Yes      Consultants:  None   Procedures:  None   Antimicrobials:  Anti-infectives (From admission, onward)    Start     Dose/Rate Route Frequency Ordered Stop   02/19/22 1115  azithromycin (ZITHROMAX) 500 mg in sodium chloride 0.9 % 250 mL IVPB  Status:  Discontinued        500 mg 250 mL/hr over 60 Minutes Intravenous Every 24 hours 02/19/22 1022 02/19/22 1027   02/18/22 2000  azithromycin (ZITHROMAX)  500 mg in sodium chloride 0.9 % 250 mL IVPB        500 mg 250 mL/hr over 60 Minutes Intravenous Every 24 hours 02/17/22 1957 02/23/22 1959   02/18/22 1700  cefTRIAXone (ROCEPHIN) 2 g in sodium chloride 0.9 % 100 mL IVPB        2 g 200 mL/hr over 30 Minutes Intravenous Every 24 hours 02/17/22 1957 02/23/22 1659   02/17/22 2200  cefTRIAXone (ROCEPHIN) 1 g in sodium chloride 0.9 %  100 mL IVPB        1 g 200 mL/hr over 30 Minutes Intravenous  Once 02/17/22 2020 02/17/22 2158   02/17/22 1845  cefTRIAXone (ROCEPHIN) 1 g in sodium chloride 0.9 % 100 mL IVPB        1 g 200 mL/hr over 30 Minutes Intravenous  Once 02/17/22 1841 02/17/22 2019   02/17/22 1845  azithromycin (ZITHROMAX) 500 mg in sodium chloride 0.9 % 250 mL IVPB        500 mg 250 mL/hr over 60 Minutes Intravenous  Once 02/17/22 1841 02/17/22 2107       Subjective: Patient seen and evaluated today with slow improvement in cough noted, however she is still feeling quite weak with poor appetite and continues to have a significant cough as well as oxygen requirement.  Objective: Vitals:   02/20/22 2238 02/21/22 0530 02/21/22 0715 02/21/22 0854  BP: (!) 132/50 (!) 140/47  (!) 146/94  Pulse: 84 89  87  Resp: 20 19    Temp: 99.3 F (37.4 C) 99.1 F (37.3 C)    TempSrc: Oral Oral    SpO2: 92% 92% 94%   Weight:      Height:        Intake/Output Summary (Last 24 hours) at 02/21/2022 0944 Last data filed at 02/20/2022 2018 Gross per 24 hour  Intake 240 ml  Output --  Net 240 ml   Filed Weights   02/17/22 1605 02/17/22 2123  Weight: 80.7 kg 84.6 kg    Examination:  General exam: Appears calm and comfortable  Respiratory system: Clear to auscultation. Respiratory effort normal.  Nasal cannula oxygen. Cardiovascular system: S1 & S2 heard, RRR.  Gastrointestinal system: Abdomen is soft Central nervous system: Alert and awake Extremities: No edema Skin: No significant lesions noted Psychiatry: Flat affect.    Data Reviewed: I have personally reviewed following labs and imaging studies  CBC: Recent Labs  Lab 02/17/22 1622 02/18/22 0554 02/19/22 0421 02/21/22 0619  WBC 15.1* 13.1* 8.9 10.0  NEUTROABS 12.4* 9.8*  --   --   HGB 11.6* 9.7* 9.7* 9.7*  HCT 34.8* 29.3* 29.3* 30.5*  MCV 86.4 87.5 86.9 89.7  PLT 293 257 262 956   Basic Metabolic Panel: Recent Labs  Lab 02/18/22 0102  02/18/22 0554 02/19/22 0421 02/20/22 0605 02/21/22 0619  NA 126* 126* 128* 133* 134*  K 3.9 3.9 3.7 3.5 3.6  CL 94* 93* 95* 97* 99  CO2 '25 26 27 29 28  '$ GLUCOSE 149* 135* 149* 144* 140*  BUN '10 12 10 '$ 7* 9  CREATININE 0.73 0.70 0.67 0.61 0.68  CALCIUM 7.7* 7.9* 7.9* 8.5* 8.5*  MG  --  1.5* 1.9 1.8 1.7   GFR: Estimated Creatinine Clearance: 55.4 mL/min (by C-G formula based on SCr of 0.68 mg/dL). Liver Function Tests: Recent Labs  Lab 02/18/22 0554  AST 12*  ALT 11  ALKPHOS 72  BILITOT 0.8  PROT 5.9*  ALBUMIN 2.8*   No results for  input(s): "LIPASE", "AMYLASE" in the last 168 hours. No results for input(s): "AMMONIA" in the last 168 hours. Coagulation Profile: No results for input(s): "INR", "PROTIME" in the last 168 hours. Cardiac Enzymes: No results for input(s): "CKTOTAL", "CKMB", "CKMBINDEX", "TROPONINI" in the last 168 hours. BNP (last 3 results) No results for input(s): "PROBNP" in the last 8760 hours. HbA1C: No results for input(s): "HGBA1C" in the last 72 hours. CBG: Recent Labs  Lab 02/20/22 0729 02/20/22 1106 02/20/22 1614 02/20/22 2241 02/21/22 0813  GLUCAP 160* 184* 164* 134* 147*   Lipid Profile: No results for input(s): "CHOL", "HDL", "LDLCALC", "TRIG", "CHOLHDL", "LDLDIRECT" in the last 72 hours. Thyroid Function Tests: No results for input(s): "TSH", "T4TOTAL", "FREET4", "T3FREE", "THYROIDAB" in the last 72 hours. Anemia Panel: No results for input(s): "VITAMINB12", "FOLATE", "FERRITIN", "TIBC", "IRON", "RETICCTPCT" in the last 72 hours. Sepsis Labs: Recent Labs  Lab 02/17/22 1910 02/18/22 0102  LATICACIDVEN 0.8 0.7    Recent Results (from the past 240 hour(s))  SARS Coronavirus 2 by RT PCR (hospital order, performed in [facility] hospital lab) *cepheid single result test* Anterior Nasal Swab     Status: None   Collection Time: 02/17/22  6:21 PM   Specimen: Anterior Nasal Swab  Result Value Ref Range Status   SARS Coronavirus 2 by RT  PCR NEGATIVE NEGATIVE Final    Comment: (NOTE) SARS-CoV-2 target nucleic acids are NOT DETECTED.  The SARS-CoV-2 RNA is generally detectable in upper and lower respiratory specimens during the acute phase of infection. The lowest concentration of SARS-CoV-2 viral copies this assay can detect is 250 copies / mL. A negative result does not preclude SARS-CoV-2 infection and should not be used as the sole basis for treatment or other patient management decisions.  A negative result may occur with improper specimen collection / handling, submission of specimen other than nasopharyngeal swab, presence of viral mutation(s) within the areas targeted by this assay, and inadequate number of viral copies (<250 copies / mL). A negative result must be combined with clinical observations, patient history, and epidemiological information.  Fact Sheet for Patients:   https://www.patel.info/  Fact Sheet for Healthcare Providers: https://hall.com/  This test is not yet approved or  cleared by the [place] FDA and has been authorized for detection and/or diagnosis of SARS-CoV-2 by FDA under an Emergency Use Authorization (EUA).  This EUA will remain in effect (meaning this test can be used) for the duration of the COVID-19 declaration under Section 564(b)(1) of the Act, 21 U.S.C. section 360bbb-3(b)(1), unless the authorization is terminated or revoked sooner.  Performed at [facility], [address] [place], [place] [address]   Blood culture (routine x 2)     Status: None (Preliminary result)   Collection Time: 02/17/22  7:10 PM   Specimen: BLOOD RIGHT HAND  Result Value Ref Range Status   Specimen Description BLOOD RIGHT HAND  Final   Special Requests   Final    BOTTLES DRAWN AEROBIC AND ANAEROBIC Blood Culture adequate volume   Culture   Final    NO GROWTH 4 DAYS Performed at [facility], [address], [place], [place] [address]    Report  Status PENDING  Incomplete  Blood culture (routine x 2)     Status: None (Preliminary result)   Collection Time: 02/17/22  7:20 PM   Specimen: BLOOD LEFT HAND  Result Value Ref Range Status   Specimen Description BLOOD LEFT HAND  Final   Special Requests   Final  BOTTLES DRAWN AEROBIC AND ANAEROBIC Blood Culture adequate volume   Culture   Final    NO GROWTH 4 DAYS Performed at [facility], [address] [place], [place] [address]    Report Status PENDING  Incomplete         Radiology Studies: No results found.      Scheduled Meds:  amLODipine  10 mg Oral Daily   budesonide (PULMICORT) nebulizer solution  0.25 mg Nebulization BID   chlorpheniramine-HYDROcodone  5 mL Oral Q12H   famotidine  20 mg Oral QHS   gabapentin  100 mg Oral TID   guaiFENesin  600 mg Oral BID   heparin  5,000 Units Subcutaneous Q8H   hydrALAZINE  25 mg Oral BID   insulin aspart  0-15 Units Subcutaneous TID WC   insulin aspart  0-5 Units Subcutaneous QHS   losartan  100 mg Oral Daily   olopatadine  1 drop Both Eyes BID   pantoprazole  40 mg Oral Daily   saccharomyces boulardii  250 mg Oral BID   Continuous Infusions:  azithromycin 500 mg (02/20/22 1948)   cefTRIAXone (ROCEPHIN)  IV 2 g (02/20/22 1826)     LOS: 3 days    Time spent: 35 minutes    Clevie Prout Darleen Crocker, DO [facility]  If 7PM-7AM, please contact night-coverage [email or web address] 02/21/2022, 9:44 AM

## 2022-02-22 ENCOUNTER — Inpatient Hospital Stay (HOSPITAL_COMMUNITY): Payer: Medicare Other

## 2022-02-22 DIAGNOSIS — J189 Pneumonia, unspecified organism: Secondary | ICD-10-CM | POA: Diagnosis not present

## 2022-02-22 LAB — BASIC METABOLIC PANEL
Anion gap: 7 (ref 5–15)
BUN: 11 mg/dL (ref 8–23)
CO2: 30 mmol/L (ref 22–32)
Calcium: 8.4 mg/dL — ABNORMAL LOW (ref 8.9–10.3)
Chloride: 99 mmol/L (ref 98–111)
Creatinine, Ser: 0.63 mg/dL (ref 0.44–1.00)
GFR, Estimated: >60 mL/min (ref >60)
Glucose, Bld: 137 mg/dL — ABNORMAL HIGH (ref 70–99)
Potassium: 3.9 mmol/L (ref 3.5–5.1)
Sodium: 136 mmol/L (ref 135–145)

## 2022-02-22 LAB — CULTURE, BLOOD (ROUTINE X 2)
Culture: NO GROWTH
Culture: NO GROWTH
Special Requests: ADEQUATE
Special Requests: ADEQUATE

## 2022-02-22 LAB — CBC
HCT: 28.2 % — ABNORMAL LOW (ref 36.0–46.0)
Hemoglobin: 9 g/dL — ABNORMAL LOW (ref 12.0–15.0)
MCH: 28.4 pg (ref 26.0–34.0)
MCHC: 31.9 g/dL (ref 30.0–36.0)
MCV: 89 fL (ref 80.0–100.0)
Platelets: 345 10*3/uL (ref 150–400)
RBC: 3.17 MIL/uL — ABNORMAL LOW (ref 3.87–5.11)
RDW: 13.5 % (ref 11.5–15.5)
WBC: 10.3 10*3/uL (ref 4.0–10.5)
nRBC: 0 % (ref 0.0–0.2)

## 2022-02-22 LAB — GLUCOSE, CAPILLARY
Glucose-Capillary: 154 mg/dL — ABNORMAL HIGH (ref 70–99)
Glucose-Capillary: 268 mg/dL — ABNORMAL HIGH (ref 70–99)
Glucose-Capillary: 243 mg/dL — ABNORMAL HIGH (ref 70–99)
Glucose-Capillary: 250 mg/dL — ABNORMAL HIGH (ref 70–99)

## 2022-02-22 LAB — MAGNESIUM: Magnesium: 1.7 mg/dL (ref 1.7–2.4)

## 2022-02-22 MED ORDER — METHYLPREDNISOLONE SODIUM SUCC 40 MG IJ SOLR
40.0000 mg | Freq: Two times a day (BID) | INTRAMUSCULAR | Status: DC
Start: 2022-02-22 — End: 2022-02-23
  Administered 2022-02-22 – 2022-02-23 (×3): 40 mg via INTRAVENOUS
  Filled 2022-02-22 (×3): qty 1

## 2022-02-22 NOTE — Progress Notes (Signed)
PROGRESS NOTE    EMERY BINZ  BZJ:[id number] DOB: December 28, 1931 DOA: 02/17/2022 PCP: Kathyrn Drown, MD   Brief Narrative:    [name] is a 88 y.o. female with medical history significant of dementia, diabetes mellitus type 2, hypertension, GERD, and more presents the ED with a chief complaint of dyspnea.  Patient was admitted with community-acquired pneumonia to the right middle lobe along with associated acute hypoxemic respiratory failure.  She is also noted to have hyponatremia likely due to her poor p.o. intake recently.  Assessment & Plan:   Principal Problem:   Pneumonia Active Problems:   Controlled type 2 diabetes mellitus without complication, without long-term current use of insulin (HCC)   Essential hypertension   Hyponatremia   Generalized anxiety disorder   Acute respiratory failure with hypoxia (HCC)   GERD (gastroesophageal reflux disease)  Assessment and Plan:   Pneumonia - X-ray findings suggesting pneumonia of the right middle lobe on chest x-ray as well as leukocytosis and cough -Started on Rocephin and Zithromax day 6/7 - Continue same antibiotics - Expectorated sputum culture - Blood cultures negative thus far - Strep and Legionella urine antigens negative - Cough continues to remain despite multiple medications - Curb 65 score = 1 - low risk - Continue to monitor -Ongoing cough and hypoxemia with no improvement in clinical symptoms, start IV Solu-Medrol 8/25 and recheck chest x-ray   GERD (gastroesophageal reflux disease) - Continue Pepcid and Protonix   Acute respiratory failure with hypoxia (HCC) - Oxygen sats down to 88% - Requiring 3 L nasal cannula to maintain saturations - No oxygen at home - Secondary to pneumonia - Albuterol as needed - Wean off O2 as tolerated - Continue to monitor   Generalized anxiety disorder - Continue Xanax   Hyponatremia-resolved - Possibly SIADH, start fluid restriction and add salt tablets -  Patient has not been feeling well, likely due to poor p.o. intake - 2 L normal saline bolus in the ED, so before adding more fluids rechecking BMP - Continue to monitor -TSH 1.34   Essential hypertension - Continue hydralazine, losartan, Norvasc - Continue to monitor   Controlled type 2 diabetes mellitus without complication, without long-term current use of insulin (HCC) - Last hemoglobin A1c was at goal 7.0 - Hold oral hypoglycemics - Sliding scale coverage - Continue to monitor   DVT prophylaxis: Heparin Code Status: DNR Family Communication: None at bedside Disposition Plan:  Status is: Inpatient Remains inpatient appropriate because: Need for IV medications.   Skin Assessment:   I have examined the patient's skin and I agree with the wound assessment as performed by the wound care RN as outlined below:   Pressure Injury 02/17/22 Sacrum Stage 1 -  Intact skin with non-blanchable redness of a localized area usually over a bony prominence. stage 1 to sacrum measuring 3 x 1 (Active)  02/17/22 2117  Location: Sacrum  Location Orientation:   Staging: Stage 1 -  Intact skin with non-blanchable redness of a localized area usually over a bony prominence.  Wound Description (Comments): stage 1 to sacrum measuring 3 x 1  Present on Admission: Yes      Consultants:  None   Procedures:  None   Antimicrobials:  Anti-infectives (From admission, onward)    Start     Dose/Rate Route Frequency Ordered Stop   02/19/22 1115  azithromycin (ZITHROMAX) 500 mg in sodium chloride 0.9 % 250 mL IVPB  Status:  Discontinued  500 mg 250 mL/hr over 60 Minutes Intravenous Every 24 hours 02/19/22 1022 02/19/22 1027   02/18/22 2000  azithromycin (ZITHROMAX) 500 mg in sodium chloride 0.9 % 250 mL IVPB        500 mg 250 mL/hr over 60 Minutes Intravenous Every 24 hours 02/17/22 1957 02/23/22 1959   02/18/22 1700  cefTRIAXone (ROCEPHIN) 2 g in sodium chloride 0.9 % 100 mL IVPB        2  g 200 mL/hr over 30 Minutes Intravenous Every 24 hours 02/17/22 1957 02/23/22 1659   02/17/22 2200  cefTRIAXone (ROCEPHIN) 1 g in sodium chloride 0.9 % 100 mL IVPB        1 g 200 mL/hr over 30 Minutes Intravenous  Once 02/17/22 2020 02/17/22 2158   02/17/22 1845  cefTRIAXone (ROCEPHIN) 1 g in sodium chloride 0.9 % 100 mL IVPB        1 g 200 mL/hr over 30 Minutes Intravenous  Once 02/17/22 1841 02/17/22 2019   02/17/22 1845  azithromycin (ZITHROMAX) 500 mg in sodium chloride 0.9 % 250 mL IVPB        500 mg 250 mL/hr over 60 Minutes Intravenous  Once 02/17/22 1841 02/17/22 2107      Subjective: Patient seen and evaluated today with ongoing cough and symptoms that do not appear to be improving despite multiple cough medications as well as breathing treatments.  She continues to have nasal cannula requirement and is overall feeling very unwell.  Objective: Vitals:   02/21/22 0854 02/21/22 1941 02/21/22 2108 02/22/22 0515  BP: (!) 146/94  (!) 136/43 (!) 156/52  Pulse: 87 81 77 80  Resp:  '18 19 17  '$ Temp:   98.8 F (37.1 C) 98.7 F (37.1 C)  TempSrc:   Oral Oral  SpO2:  (!) 88% 94% 92%  Weight:      Height:        Intake/Output Summary (Last 24 hours) at 02/22/2022 1002 Last data filed at 02/21/2022 1730 Gross per 24 hour  Intake 480 ml  Output --  Net 480 ml   Filed Weights   02/17/22 1605 02/17/22 2123  Weight: 80.7 kg 84.6 kg    Examination:  General exam: Appears calm and comfortable  Respiratory system: Clear to auscultation. Respiratory effort normal.  Nasal cannula oxygen. Cardiovascular system: S1 & S2 heard, RRR.  Gastrointestinal system: Abdomen is soft Central nervous system: Alert and awake Extremities: No edema Skin: No significant lesions noted Psychiatry: Flat affect.    Data Reviewed: I have personally reviewed following labs and imaging studies  CBC: Recent Labs  Lab 02/17/22 1622 02/18/22 0554 02/19/22 0421 02/21/22 0619 02/22/22 0420  WBC  15.1* 13.1* 8.9 10.0 10.3  NEUTROABS 12.4* 9.8*  --   --   --   HGB 11.6* 9.7* 9.7* 9.7* 9.0*  HCT 34.8* 29.3* 29.3* 30.5* 28.2*  MCV 86.4 87.5 86.9 89.7 89.0  PLT 293 257 262 319 790   Basic Metabolic Panel: Recent Labs  Lab 02/18/22 0554 02/19/22 0421 02/20/22 0605 02/21/22 0619 02/22/22 0420  NA 126* 128* 133* 134* 136  K 3.9 3.7 3.5 3.6 3.9  CL 93* 95* 97* 99 99  CO2 '26 27 29 28 30  '$ GLUCOSE 135* 149* 144* 140* 137*  BUN 12 10 7* 9 11  CREATININE 0.70 0.67 0.61 0.68 0.63  CALCIUM 7.9* 7.9* 8.5* 8.5* 8.4*  MG 1.5* 1.9 1.8 1.7 1.7   GFR: Estimated Creatinine Clearance: 55.4 mL/min (by C-G formula based  on SCr of 0.63 mg/dL). Liver Function Tests: Recent Labs  Lab 02/18/22 0554  AST 12*  ALT 11  ALKPHOS 72  BILITOT 0.8  PROT 5.9*  ALBUMIN 2.8*   No results for input(s): "LIPASE", "AMYLASE" in the last 168 hours. No results for input(s): "AMMONIA" in the last 168 hours. Coagulation Profile: No results for input(s): "INR", "PROTIME" in the last 168 hours. Cardiac Enzymes: No results for input(s): "CKTOTAL", "CKMB", "CKMBINDEX", "TROPONINI" in the last 168 hours. BNP (last 3 results) No results for input(s): "PROBNP" in the last 8760 hours. HbA1C: No results for input(s): "HGBA1C" in the last 72 hours. CBG: Recent Labs  Lab 02/21/22 0813 02/21/22 1136 02/21/22 1602 02/21/22 2217 02/22/22 0742  GLUCAP 147* 154* 157* 145* 154*   Lipid Profile: No results for input(s): "CHOL", "HDL", "LDLCALC", "TRIG", "CHOLHDL", "LDLDIRECT" in the last 72 hours. Thyroid Function Tests: No results for input(s): "TSH", "T4TOTAL", "FREET4", "T3FREE", "THYROIDAB" in the last 72 hours. Anemia Panel: No results for input(s): "VITAMINB12", "FOLATE", "FERRITIN", "TIBC", "IRON", "RETICCTPCT" in the last 72 hours. Sepsis Labs: Recent Labs  Lab 02/17/22 1910 02/18/22 0102  LATICACIDVEN 0.8 0.7    Recent Results (from the past 240 hour(s))  SARS Coronavirus 2 by RT PCR  (hospital order, performed in [facility] hospital lab) *cepheid single result test* Anterior Nasal Swab     Status: None   Collection Time: 02/17/22  6:21 PM   Specimen: Anterior Nasal Swab  Result Value Ref Range Status   SARS Coronavirus 2 by RT PCR NEGATIVE NEGATIVE Final    Comment: (NOTE) SARS-CoV-2 target nucleic acids are NOT DETECTED.  The SARS-CoV-2 RNA is generally detectable in upper and lower respiratory specimens during the acute phase of infection. The lowest concentration of SARS-CoV-2 viral copies this assay can detect is 250 copies / mL. A negative result does not preclude SARS-CoV-2 infection and should not be used as the sole basis for treatment or other patient management decisions.  A negative result may occur with improper specimen collection / handling, submission of specimen other than nasopharyngeal swab, presence of viral mutation(s) within the areas targeted by this assay, and inadequate number of viral copies (<250 copies / mL). A negative result must be combined with clinical observations, patient history, and epidemiological information.  Fact Sheet for Patients:   https://www.patel.info/  Fact Sheet for Healthcare Providers: https://hall.com/  This test is not yet approved or  cleared by the [place] FDA and has been authorized for detection and/or diagnosis of SARS-CoV-2 by FDA under an Emergency Use Authorization (EUA).  This EUA will remain in effect (meaning this test can be used) for the duration of the COVID-19 declaration under Section 564(b)(1) of the Act, 21 U.S.C. section 360bbb-3(b)(1), unless the authorization is terminated or revoked sooner.  Performed at [facility], [address], [place], [place] [address]   Blood culture (routine x 2)     Status: None   Collection Time: 02/17/22  7:10 PM   Specimen: BLOOD RIGHT HAND  Result Value Ref Range Status   Specimen Description BLOOD RIGHT  HAND  Final   Special Requests   Final    BOTTLES DRAWN AEROBIC AND ANAEROBIC Blood Culture adequate volume   Culture   Final    NO GROWTH 5 DAYS Performed at [facility], [address], [place], [place] [address]    Report Status 02/22/2022 FINAL  Final  Blood culture (routine x 2)     Status: None   Collection  Time: 02/17/22  7:20 PM   Specimen: BLOOD LEFT HAND  Result Value Ref Range Status   Specimen Description BLOOD LEFT HAND  Final   Special Requests   Final    BOTTLES DRAWN AEROBIC AND ANAEROBIC Blood Culture adequate volume   Culture   Final    NO GROWTH 5 DAYS Performed at [facility], [address], [place], [place] [address]    Report Status 02/22/2022 FINAL  Final         Radiology Studies: No results found.      Scheduled Meds:  amLODipine  10 mg Oral Daily   budesonide (PULMICORT) nebulizer solution  0.25 mg Nebulization BID   chlorpheniramine-HYDROcodone  5 mL Oral Q12H   famotidine  20 mg Oral QHS   gabapentin  100 mg Oral TID   guaiFENesin  600 mg Oral BID   heparin  5,000 Units Subcutaneous Q8H   hydrALAZINE  25 mg Oral BID   insulin aspart  0-15 Units Subcutaneous TID WC   insulin aspart  0-5 Units Subcutaneous QHS   losartan  100 mg Oral Daily   methylPREDNISolone (SOLU-MEDROL) injection  40 mg Intravenous Q12H   olopatadine  1 drop Both Eyes BID   pantoprazole  40 mg Oral Daily   saccharomyces boulardii  250 mg Oral BID   Continuous Infusions:  azithromycin 500 mg (02/21/22 1955)   cefTRIAXone (ROCEPHIN)  IV 2 g (02/21/22 1727)     LOS: 4 days    Time spent: 35 minutes    Lilymae Swiech Darleen Crocker, DO [facility]  If 7PM-7AM, please contact night-coverage [email or web address] 02/22/2022, 10:02 AM

## 2022-02-22 NOTE — Care Management Important Message (Signed)
Important Message  Patient Details  Name: [name] MRN: [id number] Date of Birth: 1931-10-17   Medicare Important Message Given:  Yes     Tommy Medal 02/22/2022, 12:12 PM

## 2022-02-22 NOTE — Plan of Care (Signed)
  Problem: Acute Rehab PT Goals(only PT should resolve) Goal: Pt Will Go Supine/Side To Sit Outcome: Progressing Flowsheets (Taken 02/22/2022 1237) Pt will go Supine/Side to Sit:  Independently  with modified independence Goal: Patient Will Transfer Sit To/From Stand Outcome: Progressing Flowsheets (Taken 02/22/2022 1237) Patient will transfer sit to/from stand: with modified independence Goal: Pt Will Transfer Bed To Chair/Chair To Bed Outcome: Progressing Flowsheets (Taken 02/22/2022 1237) Pt will Transfer Bed to Chair/Chair to Bed: with modified independence Goal: Pt Will Ambulate Outcome: Progressing Flowsheets (Taken 02/22/2022 1237) Pt will Ambulate:  75 feet  with modified independence  with supervision  with rolling walker   12:37 PM, 02/22/22 Lonell Grandchild, MPT Physical Therapist with [facility] [phone] office [phone] mobile phone

## 2022-02-22 NOTE — NC FL2 (Signed)
[place] LEVEL OF CARE SCREENING TOOL     IDENTIFICATION  Patient Name: [name] Birthdate: 11-10-1931 Sex: female Admission Date (Current Location): 02/17/2022  [facility] and [place] Number:  [employer] and Address:  [place] [address] [place]      Provider Number: [id number]  Attending Physician Name and Address:  Rodena Goldmann, DO  Relative Name and Phone Number:  Makennah, Omura)   [phone]    Current Level of Care: Hospital Recommended Level of Care: [place] Prior Approval Number:    Date Approved/Denied:   PASRR Number: [id number]  Discharge Plan: SNF    Current Diagnoses: Patient Active Problem List   Diagnosis Date Noted   Pneumonia 02/17/2022   GERD (gastroesophageal reflux disease) 02/17/2022   Acute respiratory failure with hypoxia ([place]) 02/26/2021   Myalgia due to statin 02/15/2020   Cognitive dysfunction 03/04/2019   Generalized anxiety disorder 06/05/2018   Floaters in visual field, bilateral 03/23/2018   Living in assisted living 03/23/2018   Hyponatremia 03/08/2018   C. difficile diarrhea 03/03/2018   Osteoarthritis of right knee 12/02/2017   Closed fracture of left orbital floor with routine healing 01/17/2017   Hyperlipidemia associated with type 2 diabetes mellitus ([place]) 10/02/2016   Pedal edema 10/02/2016   Insomnia 10/02/2016   SBO (small bowel obstruction) ([place]) 07/02/2016   Dyspnea on exertion 02/24/2016   Other fatigue    Leukocytosis 12/16/2014   Essential hypertension 12/15/2014   Controlled type 2 diabetes mellitus without complication, without long-term current use of insulin ([place]) 02/15/2011    Orientation RESPIRATION BLADDER Height & Weight     Self, Time, Situation, Place  O2 (see dc summary) Incontinent Weight: 186 lb 8.2 oz (84.6 kg) (standing scale) Height:  '5\' 8"'$  (172.7 cm)  BEHAVIORAL SYMPTOMS/MOOD NEUROLOGICAL BOWEL NUTRITION STATUS       Continent Diet (see dc summary)  AMBULATORY STATUS COMMUNICATION OF NEEDS Skin   Extensive Assist Verbally PU Stage and Appropriate Care PU Stage 1 Dressing: Daily ([place])                     Personal Care Assistance Level of Assistance  Bathing, Feeding, Dressing Bathing Assistance: Limited assistance Feeding assistance: Independent Dressing Assistance: Limited assistance     Functional Limitations Info  Sight, Speech, Hearing Sight Info: Impaired Hearing Info: Adequate Speech Info: Adequate    SPECIAL CARE FACTORS FREQUENCY  PT (By licensed PT), OT (By licensed OT)     PT Frequency: 5x week OT Frequency: 3x week            Contractures Contractures Info: Not present    Additional Factors Info  Code Status, Allergies Code Status Info: DNR Allergies Info: Hydrocortisone, Lisinopril, phenergan, statins, trazodone, prednisone           Current Medications (02/22/2022):  This is the current hospital active medication list Current Facility-Administered Medications  Medication Dose Route Frequency Provider Last Rate Last Admin   acetaminophen (TYLENOL) tablet 650 mg  650 mg Oral Q6H PRN Zierle-Ghosh, Asia B, DO   650 mg at 02/21/22 1738   albuterol (PROVENTIL) (2.5 MG/3ML) 0.083% nebulizer solution 2.5 mg  2.5 mg Nebulization Q4H PRN Zierle-Ghosh, Asia B, DO   2.5 mg at 02/18/22 2006   ALPRAZolam (XANAX) tablet 0.25 mg  0.25 mg Oral BID PRN Zierle-Ghosh, Asia B, DO   0.25 mg at 02/21/22 2113   amLODipine (NORVASC) tablet 10 mg  10 mg Oral Daily Zierle-Ghosh, Asia B, DO   10 mg at 02/22/22 0845   azithromycin (ZITHROMAX) 500 mg in sodium chloride 0.9 % 250 mL IVPB  500 mg Intravenous Q24H Zierle-Ghosh, Asia B, DO 250 mL/hr at 02/21/22 1955 500 mg at 02/21/22 1955   benzonatate (TESSALON) capsule 100 mg  100 mg Oral TID PRN Zierle-Ghosh, Asia B, DO   100 mg at 02/20/22 1851   budesonide (PULMICORT) nebulizer solution 0.25 mg  0.25 mg Nebulization BID Manuella Ghazi, Pratik  D, DO   0.25 mg at 02/22/22 0817   cefTRIAXone (ROCEPHIN) 2 g in sodium chloride 0.9 % 100 mL IVPB  2 g Intravenous Q24H Zierle-Ghosh, Asia B, DO 200 mL/hr at 02/21/22 1727 2 g at 02/21/22 1727   chlorpheniramine-HYDROcodone (TUSSIONEX) 10-8 MG/5ML suspension 5 mL  5 mL Oral Q12H Shah, Pratik D, DO   5 mL at 02/22/22 0845   famotidine (PEPCID) tablet 20 mg  20 mg Oral QHS Zierle-Ghosh, Asia B, DO   20 mg at 02/21/22 2113   gabapentin (NEURONTIN) capsule 100 mg  100 mg Oral TID Zierle-Ghosh, Asia B, DO   100 mg at 02/22/22 0845   guaiFENesin (MUCINEX) 12 hr tablet 600 mg  600 mg Oral BID Manuella Ghazi, Pratik D, DO   600 mg at 02/22/22 0845   guaiFENesin-dextromethorphan (ROBITUSSIN DM) 100-10 MG/5ML syrup 15 mL  15 mL Oral Q4H PRN Heath Lark D, DO   15 mL at 02/21/22 1224   heparin injection 5,000 Units  5,000 Units Subcutaneous Q8H Zierle-Ghosh, Asia B, DO   5,000 Units at 02/22/22 0513   hydrALAZINE (APRESOLINE) tablet 25 mg  25 mg Oral BID Zierle-Ghosh, Asia B, DO   25 mg at 02/22/22 0845   insulin aspart (novoLOG) injection 0-15 Units  0-15 Units Subcutaneous TID WC Zierle-Ghosh, Asia B, DO   3 Units at 02/22/22 0846   insulin aspart (novoLOG) injection 0-5 Units  0-5 Units Subcutaneous QHS Zierle-Ghosh, Asia B, DO   2 Units at 02/17/22 2132   ipratropium-albuterol (DUONEB) 0.5-2.5 (3) MG/3ML nebulizer solution 3 mL  3 mL Nebulization Q6H PRN Manuella Ghazi, Pratik D, DO       lidocaine (LMX) 4 % cream   Topical TID PRN Zierle-Ghosh, Asia B, DO       losartan (COZAAR) tablet 100 mg  100 mg Oral Daily Zierle-Ghosh, Asia B, DO   100 mg at 02/22/22 0844   methylPREDNISolone sodium succinate (SOLU-MEDROL) 40 mg/mL injection 40 mg  40 mg Intravenous Q12H Shah, Pratik D, DO   40 mg at 02/22/22 0845   morphine (PF) 2 MG/ML injection 2 mg  2 mg Intravenous Q2H PRN Zierle-Ghosh, Asia B, DO   2 mg at 02/18/22 0028   olopatadine (PATANOL) 0.1 % ophthalmic solution 1 drop  1 drop Both Eyes BID Zierle-Ghosh, Asia B, DO   1  drop at 02/22/22 0848   Oral care mouth rinse  15 mL Mouth Rinse PRN Manuella Ghazi, Pratik D, DO       oxyCODONE (Oxy IR/ROXICODONE) immediate release tablet 5 mg  5 mg Oral Q6H PRN Zierle-Ghosh, Asia B, DO   5 mg at 02/21/22 2113   pantoprazole (PROTONIX) EC tablet 40 mg  40 mg Oral Daily Zierle-Ghosh, Asia B, DO   40 mg at 02/22/22 0845   polyvinyl alcohol (LIQUIFILM TEARS) 1.4 % ophthalmic solution 1 drop  1 drop Both Eyes TID PRN Zierle-Ghosh, Asia B, DO       saccharomyces boulardii (FLORASTOR) capsule  250 mg  250 mg Oral BID Heath Lark D, DO   250 mg at 02/22/22 6789     Discharge Medications: Please see discharge summary for a list of discharge medications.  Relevant Imaging Results:  Relevant Lab Results:   Additional Information SSN: [id number], LCSW

## 2022-02-22 NOTE — Evaluation (Addendum)
Physical Therapy Evaluation Patient Details Name: KALAN RINN MRN: [id number] DOB: 1932-06-16 Today's Date: 02/22/2022  History of Present Illness  [name] is a 88 y.o. female with medical history significant of dementia, diabetes mellitus type 2, hypertension, GERD, and more presents the ED with a chief complaint of dyspnea.  Patient reports the symptoms started 6 days ago.  It started with a sore throat, so she gargled salt water and that got better.  The next day she started having a cough and dyspnea.  Her PCP sent her in [place] that were not really helping.  The cough has been nonproductive.  She has felt totally wiped out, and has had dyspnea that is worse on exertion.  She has had associated myalgias.  Tmax prior to arrival was 99.2, in the ER 100.2.  Patient denies any nausea vomiting diarrhea constipation.  She does report loose stools 1-2 times per day that are nonbloody.  She reports she has not been eating much, only yogurt the past couple of days and she attributes her loose stools to that.  She has no dysuria.  Patient has no other complaints at this time.   Clinical Impression  Patient presents with right knee brace with good return for donning/doffing by herself.  Patient demonstrates fair/good return for getting into/out of bed with slightly labored movement, has to lean on armrest of chair with standing without AD, required use of RW for safety demonstrating slow labored cadence without loss of balance on room air with SpO2 at 96%, but limited mostly due to increasing right knee pain and fatigue.  Patient tolerated sitting up in chair after therapy.  Patient will benefit from continued skilled physical therapy in hospital and recommended venue below to increase strength, balance, endurance for safe ADLs and gait.         Recommendations for follow up therapy are one component of a multi-disciplinary discharge planning process, led by the attending physician.   Recommendations may be updated based on patient status, additional functional criteria and insurance authorization.  Follow Up Recommendations Skilled nursing-short term rehab (<3 hours/day) Can patient physically be transported by private vehicle: Yes    Assistance Recommended at Discharge Set up Supervision/Assistance  Patient can return home with the following  A little help with walking and/or transfers;A little help with bathing/dressing/bathroom;Help with stairs or ramp for entrance;Assistance with cooking/housework    Equipment Recommendations None recommended by PT  Recommendations for Other Services       Functional Status Assessment Patient has had a recent decline in their functional status and demonstrates the ability to make significant improvements in function in a reasonable and predictable amount of time.     Precautions / Restrictions Precautions Precautions: Fall Restrictions Weight Bearing Restrictions: No      Mobility  Bed Mobility Overal bed mobility: Needs Assistance Bed Mobility: Supine to Sit, Sit to Supine     Supine to sit: Supervision Sit to supine: Supervision   General bed mobility comments: slightly labored movement with difficulty moving RLE due to knee pain    Transfers Overall transfer level: Needs assistance Equipment used: Rolling walker (2 wheels) Transfers: Sit to/from Stand, Bed to chair/wheelchair/BSC Sit to Stand: Min guard   Step pivot transfers: Min guard       General transfer comment: increased time, labored movement    Ambulation/Gait Ambulation/Gait assistance: Min guard, Min assist Gait Distance (Feet): 35 Feet Assistive device: Rolling walker (2 wheels) Gait Pattern/deviations: Decreased step length -  right, Decreased step length - left, Decreased stride length Gait velocity: decreased     General Gait Details: slow labored cadence without loss of balance, limited mostly due to c/o increasing right knee pain  and fatigue  Stairs            Wheelchair Mobility    Modified Rankin (Stroke Patients Only)       Balance Overall balance assessment: Needs assistance Sitting-balance support: Feet supported, No upper extremity supported Sitting balance-Leahy Scale: Good Sitting balance - Comments: seated at EOB   Standing balance support: During functional activity, No upper extremity supported Standing balance-Leahy Scale: Poor Standing balance comment: fair/poor without AD, fair/good using RW                             Pertinent Vitals/Pain Pain Assessment Pain Assessment: Faces Faces Pain Scale: Hurts even more Pain Location: right knee Pain Descriptors / Indicators: Sore, Grimacing, Guarding Pain Intervention(s): Monitored during session, Limited activity within patient's tolerance, Repositioned, Ice applied, Patient requesting pain meds-RN notified    Home Living Family/patient expects to be discharged to:: Assisted living                 Home Equipment: Conservation officer, nature (2 wheels)      Prior Function Prior Level of Function : Needs assist       Physical Assist : Mobility (physical);ADLs (physical) Mobility (physical): Bed mobility;Transfers;Gait;Stairs   Mobility Comments: Household and short distanced community ambulator without AD ADLs Comments: Mostly Independent, "per patient"     Hand Dominance        Extremity/Trunk Assessment   Upper Extremity Assessment Upper Extremity Assessment: Overall WFL for tasks assessed    Lower Extremity Assessment Lower Extremity Assessment: Generalized weakness;RLE deficits/detail RLE Deficits / Details: grossly -4/5 RLE: Unable to fully assess due to pain RLE Sensation: WNL RLE Coordination: WNL    Cervical / Trunk Assessment Cervical / Trunk Assessment: Normal  Communication   Communication: No difficulties  Cognition Arousal/Alertness: Awake/alert Behavior During Therapy: WFL for tasks  assessed/performed Overall Cognitive Status: Within Functional Limits for tasks assessed                                          General Comments      Exercises     Assessment/Plan    PT Assessment Patient needs continued PT services  PT Problem List Decreased strength;Decreased activity tolerance;Decreased balance;Decreased mobility;Decreased range of motion       PT Treatment Interventions DME instruction;Gait training;Stair training;Functional mobility training;Therapeutic exercise;Therapeutic activities;Patient/family education;Balance training    PT Goals (Current goals can be found in the Care Plan section)  Acute Rehab PT Goals Patient Stated Goal: return home after rehab PT Goal Formulation: With patient Time For Goal Achievement: 03/08/22 Potential to Achieve Goals: Good    Frequency Min 3X/week     Co-evaluation               AM-PAC PT "6 Clicks" Mobility  Outcome Measure Help needed turning from your back to your side while in a flat bed without using bedrails?: None Help needed moving from lying on your back to sitting on the side of a flat bed without using bedrails?: None Help needed moving to and from a bed to a chair (including a wheelchair)?: A Little Help needed standing up  from a chair using your arms (e.g., wheelchair or bedside chair)?: A Little Help needed to walk in hospital room?: A Little Help needed climbing 3-5 steps with a railing? : A Lot 6 Click Score: 19    End of Session   Activity Tolerance: Patient tolerated treatment well;Patient limited by fatigue;Patient limited by pain Patient left: in chair;with call bell/phone within reach Nurse Communication: Mobility status PT Visit Diagnosis: Unsteadiness on feet (R26.81);Other abnormalities of gait and mobility (R26.89);Muscle weakness (generalized) (M62.81);Pain Pain - Right/Left: Right Pain - part of body: Knee    Time: 0920-0946 PT Time Calculation (min)  (ACUTE ONLY): 26 min   Charges:   PT Evaluation $PT Eval Moderate Complexity: 1 Mod PT Treatments $Therapeutic Activity: 23-37 mins        12:33 PM, 02/22/22 Lonell Grandchild, MPT Physical Therapist with [facility] [phone] office [phone] mobile phone

## 2022-02-22 NOTE — TOC Progression Note (Signed)
Transition of Care [facility]) - Progression Note    Patient Details  Name: [name] MRN: [id number] Date of Birth: Nov 18, 1931  [facility]) CM/SW Contact  Shade Flood, LCSW Phone Number: 02/22/2022, 10:58 AM  Clinical Narrative:     TOC following. Per MD, pt continuing with need for inpatient hospital care. DC timeframe not yet known. Anticipating pt will need SNF rehab at dc. TOC spoke with pt who states that she agrees with SNF rehab. CMS provider options reviewed. Will refer as requested.   TOC will follow.  Expected Discharge Plan: [place] Barriers to Discharge: Continued Medical Work up  Expected Discharge Plan and Services Expected Discharge Plan: [place] In-house Referral: Clinical Social Work   Post Acute Care Choice: [place] Living arrangements for the past 2 months: [place]                                       Social Determinants of Health (SDOH) Interventions    Readmission Risk Interventions     No data to display

## 2022-02-23 DIAGNOSIS — E1142 Type 2 diabetes mellitus with diabetic polyneuropathy: Secondary | ICD-10-CM | POA: Diagnosis not present

## 2022-02-23 DIAGNOSIS — E785 Hyperlipidemia, unspecified: Secondary | ICD-10-CM | POA: Diagnosis not present

## 2022-02-23 DIAGNOSIS — M6281 Muscle weakness (generalized): Secondary | ICD-10-CM | POA: Diagnosis not present

## 2022-02-23 DIAGNOSIS — R41841 Cognitive communication deficit: Secondary | ICD-10-CM | POA: Diagnosis not present

## 2022-02-23 DIAGNOSIS — E1169 Type 2 diabetes mellitus with other specified complication: Secondary | ICD-10-CM | POA: Diagnosis not present

## 2022-02-23 DIAGNOSIS — R488 Other symbolic dysfunctions: Secondary | ICD-10-CM | POA: Diagnosis not present

## 2022-02-23 DIAGNOSIS — J189 Pneumonia, unspecified organism: Secondary | ICD-10-CM | POA: Diagnosis not present

## 2022-02-23 DIAGNOSIS — F419 Anxiety disorder, unspecified: Secondary | ICD-10-CM | POA: Diagnosis not present

## 2022-02-23 DIAGNOSIS — E43 Unspecified severe protein-calorie malnutrition: Secondary | ICD-10-CM | POA: Diagnosis not present

## 2022-02-23 DIAGNOSIS — Z9981 Dependence on supplemental oxygen: Secondary | ICD-10-CM | POA: Diagnosis not present

## 2022-02-23 DIAGNOSIS — R279 Unspecified lack of coordination: Secondary | ICD-10-CM | POA: Diagnosis not present

## 2022-02-23 DIAGNOSIS — I700 Atherosclerosis of aorta: Secondary | ICD-10-CM | POA: Diagnosis not present

## 2022-02-23 DIAGNOSIS — F411 Generalized anxiety disorder: Secondary | ICD-10-CM | POA: Diagnosis not present

## 2022-02-23 DIAGNOSIS — Z741 Need for assistance with personal care: Secondary | ICD-10-CM | POA: Diagnosis not present

## 2022-02-23 DIAGNOSIS — R262 Difficulty in walking, not elsewhere classified: Secondary | ICD-10-CM | POA: Diagnosis not present

## 2022-02-23 DIAGNOSIS — R498 Other voice and resonance disorders: Secondary | ICD-10-CM | POA: Diagnosis not present

## 2022-02-23 DIAGNOSIS — F0390 Unspecified dementia without behavioral disturbance: Secondary | ICD-10-CM | POA: Diagnosis not present

## 2022-02-23 DIAGNOSIS — K649 Unspecified hemorrhoids: Secondary | ICD-10-CM | POA: Diagnosis not present

## 2022-02-23 DIAGNOSIS — E1159 Type 2 diabetes mellitus with other circulatory complications: Secondary | ICD-10-CM | POA: Diagnosis not present

## 2022-02-23 DIAGNOSIS — E222 Syndrome of inappropriate secretion of antidiuretic hormone: Secondary | ICD-10-CM | POA: Diagnosis not present

## 2022-02-23 DIAGNOSIS — K219 Gastro-esophageal reflux disease without esophagitis: Secondary | ICD-10-CM | POA: Diagnosis not present

## 2022-02-23 DIAGNOSIS — J9601 Acute respiratory failure with hypoxia: Secondary | ICD-10-CM | POA: Diagnosis not present

## 2022-02-23 DIAGNOSIS — J3089 Other allergic rhinitis: Secondary | ICD-10-CM | POA: Diagnosis not present

## 2022-02-23 DIAGNOSIS — I152 Hypertension secondary to endocrine disorders: Secondary | ICD-10-CM | POA: Diagnosis not present

## 2022-02-23 DIAGNOSIS — E871 Hypo-osmolality and hyponatremia: Secondary | ICD-10-CM | POA: Diagnosis not present

## 2022-02-23 DIAGNOSIS — E1151 Type 2 diabetes mellitus with diabetic peripheral angiopathy without gangrene: Secondary | ICD-10-CM | POA: Diagnosis not present

## 2022-02-23 DIAGNOSIS — R0600 Dyspnea, unspecified: Secondary | ICD-10-CM | POA: Diagnosis not present

## 2022-02-23 LAB — BASIC METABOLIC PANEL
Anion gap: 10 (ref 5–15)
BUN: 17 mg/dL (ref 8–23)
CO2: 28 mmol/L (ref 22–32)
Calcium: 9.1 mg/dL (ref 8.9–10.3)
Chloride: 97 mmol/L — ABNORMAL LOW (ref 98–111)
Creatinine, Ser: 0.68 mg/dL (ref 0.44–1.00)
GFR, Estimated: >60 mL/min (ref >60)
Glucose, Bld: 206 mg/dL — ABNORMAL HIGH (ref 70–99)
Potassium: 4 mmol/L (ref 3.5–5.1)
Sodium: 135 mmol/L (ref 135–145)

## 2022-02-23 LAB — GLUCOSE, CAPILLARY
Glucose-Capillary: 203 mg/dL — ABNORMAL HIGH (ref 70–99)
Glucose-Capillary: 209 mg/dL — ABNORMAL HIGH (ref 70–99)

## 2022-02-23 LAB — CBC
HCT: 32.6 % — ABNORMAL LOW (ref 36.0–46.0)
Hemoglobin: 10.6 g/dL — ABNORMAL LOW (ref 12.0–15.0)
MCH: 28.4 pg (ref 26.0–34.0)
MCHC: 32.5 g/dL (ref 30.0–36.0)
MCV: 87.4 fL (ref 80.0–100.0)
Platelets: 428 10*3/uL — ABNORMAL HIGH (ref 150–400)
RBC: 3.73 MIL/uL — ABNORMAL LOW (ref 3.87–5.11)
RDW: 13.4 % (ref 11.5–15.5)
WBC: 13.6 10*3/uL — ABNORMAL HIGH (ref 4.0–10.5)
nRBC: 0 % (ref 0.0–0.2)

## 2022-02-23 LAB — MAGNESIUM: Magnesium: 1.9 mg/dL (ref 1.7–2.4)

## 2022-02-23 MED ORDER — GUAIFENESIN ER 600 MG PO TB12: 600.0000 mg | ORAL_TABLET | Freq: Two times a day (BID) | ORAL | 0 refills | Status: AC

## 2022-02-23 MED ORDER — COMBIVENT RESPIMAT 20-100 MCG/ACT IN AERS: 1.0000 | INHALATION_SPRAY | Freq: Four times a day (QID) | RESPIRATORY_TRACT | 0 refills | Status: DC | PRN

## 2022-02-23 MED ORDER — HYDROCOD POLI-CHLORPHE POLI ER 10-8 MG/5ML PO SUER
5.0000 mL | Freq: Two times a day (BID) | ORAL | 0 refills | Status: AC
Start: 2022-02-23 — End: 2022-02-28

## 2022-02-23 MED ORDER — PREDNISONE 10 MG PO TABS: 40.0000 mg | ORAL_TABLET | Freq: Every day | ORAL | 0 refills | Status: AC

## 2022-02-23 MED ORDER — ALPRAZOLAM 0.25 MG PO TABS: 0.2500 mg | ORAL_TABLET | Freq: Two times a day (BID) | ORAL | 0 refills | Status: DC

## 2022-02-23 MED ORDER — SACCHAROMYCES BOULARDII 250 MG PO CAPS: 250.0000 mg | ORAL_CAPSULE | Freq: Two times a day (BID) | ORAL | 0 refills | Status: AC

## 2022-02-23 NOTE — Progress Notes (Signed)
Nsg Discharge Note  Admit Date:  02/17/2022 Discharge date: 02/23/2022   MACKENZIE GROOM to be D/C'd Skilled nursing facility per MD order.  AVS completed.  Copy for chart, and copy for patient signed, and dated. Patient/caregiver able to verbalize understanding.  Discharge Medication: Allergies as of 02/23/2022       Reactions   Hydrocortisone Itching   Lisinopril    Other reaction(s): Lethargy (intolerance)   Phenergan [promethazine Hcl] Other (See Comments)   hallucinations   Statins Other (See Comments)   Severe myalgias   Trazodone And Nefazodone Cough   Prednisone Rash        Medication List     TAKE these medications    acetaminophen 500 MG tablet Commonly known as: TYLENOL Take 1 tablet (500 mg total) by mouth every 6 (six) hours as needed. What changed: reasons to take this   Allergy Relief 10 MG tablet Generic drug: loratadine TAKE (1) TABLET BY MOUTH ONCE DAILY. What changed: See the new instructions.   ALPRAZolam 0.25 MG tablet Commonly known as: XANAX Take 1 tablet (0.25 mg total) by mouth 2 (two) times daily.   amLODipine 10 MG tablet Commonly known as: NORVASC Take 1 tablet (10 mg total) by mouth daily.   Anti-Diarrheal 2 MG tablet Generic drug: loperamide TAKE 1 TABLET BY MOUTH 3 TIMES A DAY AS NEEDED FOR DIARRHEA. What changed: See the new instructions.   benzonatate 100 MG capsule Commonly known as: TESSALON Take 1 capsule (100 mg total) by mouth 3 (three) times daily as needed for cough.   PreserVision AREDS 2 Caps Take 1 capsule by mouth in the morning and at bedtime. What changed: Another medication with the same name was changed. Make sure you understand how and when to take each.   beta carotene w/minerals tablet TAKE 1 TABLET BY MOUTH AT BEDTIME. What changed: when to take this   chlorpheniramine-HYDROcodone 10-8 MG/5ML Commonly known as: TUSSIONEX Take 5 mLs by mouth every 12 (twelve) hours for 5 days.   Combivent Respimat  20-100 MCG/ACT Aers respimat Generic drug: Ipratropium-Albuterol Inhale 1 puff into the lungs every 6 (six) hours as needed for wheezing or shortness of breath.   dicyclomine 10 MG capsule Commonly known as: Bentyl 1 bid prn abd cramps and loose stool What changed:  how much to take how to take this when to take this reasons to take this additional instructions   EasyMax Test test strip Generic drug: glucose blood CHECK BLOOD SUGAR ONCE DAILY.(CALL MD IF BS BELOW 60: OR IF BS ABOVE 400)   famotidine 20 MG tablet Commonly known as: PEPCID TAKE 1 TABLET BY MOUTH EACH MORNING. What changed: See the new instructions.   Fish Oil 1000 MG Caps One capsule BID What changed:  how much to take how to take this when to take this additional instructions   gabapentin 100 MG capsule Commonly known as: NEURONTIN Take 300 mg by mouth 2 (two) times daily.   Geri-Tussin DM 10-100 MG/5ML liquid Generic drug: Dextromethorphan-guaiFENesin Take 5 mLs by mouth every 4 (four) hours as needed for cough.   GoodSense Hemorrhoidal 0.25-14-74.9 % rectal ointment Generic drug: phenylephrine-shark liver oil-mineral oil-petrolatum Place 1 Application rectally 3 (three) times daily as needed for irritation.   guaiFENesin 600 MG 12 hr tablet Commonly known as: MUCINEX Take 1 tablet (600 mg total) by mouth 2 (two) times daily for 10 days.   hydrALAZINE 25 MG tablet Commonly known as: APRESOLINE Take 1 tablet (25 mg total)  by mouth 2 (two) times daily.   hydrOXYzine 25 MG tablet Commonly known as: ATARAX TAKE 1 TABLET BY MOUTH 3 TIMES A DAY AS NEEDED FOR ITCHING. What changed: See the new instructions.   losartan 100 MG tablet Commonly known as: COZAAR TAKE (1) TABLET BY MOUTH ONCE DAILY. What changed: See the new instructions.   metFORMIN 500 MG tablet Commonly known as: GLUCOPHAGE Take one tablet '500mg'$  po in the morning and 1/2 tablet 250 mg po at supper What changed:  how much to  take how to take this when to take this additional instructions   pantoprazole 20 MG tablet Commonly known as: PROTONIX TAKE (1) TABLET BY MOUTH ONCE DAILY. What changed:  how much to take how to take this when to take this additional instructions   predniSONE 10 MG tablet Commonly known as: DELTASONE Take 4 tablets (40 mg total) by mouth daily for 5 days.   saccharomyces boulardii 250 MG capsule Commonly known as: FLORASTOR Take 1 capsule (250 mg total) by mouth 2 (two) times daily.   Theratears 0.25 % Soln Generic drug: Carboxymethylcellulose Sodium Apply 1 drop to eye in the morning and at bedtime.   Vitamin B-12 2500 MCG Subl TAKE 1/2 TABLET (1250MCG) BY MOUTH AT BEDTIME. What changed: See the new instructions.   Vitamin D (Cholecalciferol) 10 MCG (400 UNIT) Tabs TAKE 1 TABLET BY MOUTH ONCE DAILY.        Discharge Assessment: Vitals:   02/23/22 0938 02/23/22 1152  BP:    Pulse:  78  Resp:    Temp:    SpO2: 96% 94%   Skin clean, dry and intact without evidence of skin break down, no evidence of skin tears noted. IV catheter discontinued intact. Site without signs and symptoms of complications - no redness or edema noted at insertion site, patient denies c/o pain - only slight tenderness at site.  Dressing with slight pressure applied.  D/c Instructions-Education: Discharge instructions given to patient/family with verbalized understanding. D/c education completed with patient/family including follow up instructions, medication list, d/c activities limitations if indicated, with other d/c instructions as indicated by MD - patient able to verbalize understanding, all questions fully answered. Patient instructed to return to ED, call 911, or call MD for any changes in condition.  Patient escorted via [place], and D/C home via private auto.  Dorcas Mcmurray, LPN 2/99/3716 96:78 PM

## 2022-02-23 NOTE — TOC Transition Note (Signed)
Transition of Care [facility]) - CM/SW Discharge Note   Patient Details  Name: [name] MRN: [id number] Date of Birth: 06-15-1932  Transition of Care [facility]) CM/SW Contact:  Shade Flood, LCSW Phone Number: 02/23/2022, 10:48 AM   Clinical Narrative:     Pt stable for dc to [facility] today per MD. RN to call report. DC clinical sent electronically.  TOC spoke with pt and son and they remain in agreement with dc plan.  No other TOC needs for dc.  Final next level of care: Skilled Nursing Facility Barriers to Discharge: Barriers Resolved   Patient Goals and CMS Choice Patient states their goals for this hospitalization and ongoing recovery are:: go to SNF rehab short term CMS Medicare.gov Compare Post Acute Care list provided to:: Patient Choice offered to / list presented to : Patient  Discharge Placement              Patient chooses bed at: [facility] Patient to be transferred to facility by: w/c Name of family member notified: [name] Patient and family notified of of transfer: 02/23/22  Discharge Plan and Services In-house Referral: Clinical Social Work   Post Acute Care Choice: [place]                               Social Determinants of Health (SDOH) Interventions     Readmission Risk Interventions     No data to display

## 2022-02-23 NOTE — Discharge Summary (Signed)
Physician Discharge Summary  [name] ERD:[id number] DOB: 29-Nov-1931 DOA: 02/17/2022  PCP: Kathyrn Drown, MD  Admit date: 02/17/2022  Discharge date: 02/23/2022  Admitted From:ALF  Disposition:  SNF  Recommendations for Outpatient Follow-up:  Follow up with PCP in 1-2 weeks Continue on prednisone as prescribed for 5 more days as well as antitussives as prescribed Patient has completed 7-day course of antibiotics with no further needs Continue other home medications as prior and as noted below  Home Health: None  Equipment/Devices: Oxygen 2 L nasal cannula  Discharge Condition:Stable  CODE STATUS: DNR  Diet recommendation: Heart Healthy/carb modified with 2 L fluid restriction  Brief/Interim Summary: [name] is a 88 y.o. female with medical history significant of dementia, diabetes mellitus type 2, hypertension, GERD, and more presents the ED with a chief complaint of dyspnea.  Patient was admitted with community-acquired pneumonia to the right middle lobe along with associated acute hypoxemic respiratory failure.  She was also noted to have some hyponatremia that appears to be related to some mild SIADH.  She had required several days of ongoing treatment with IV antibiotics and progression of antitussives and finally addition of steroids to get her symptoms under better control.  She has been seen by physical therapy due to her weakness and has been recommended for SNF which she is currently agreeable to.  She is currently stable for discharge and has received maximal medical benefit during the course of this hospitalization.  She will remain on steroids for several more days and will need close monitoring of her blood glucose levels.  She has completed 7-day course of antibiotics with Rocephin and azithromycin and does not require any further antibiotics.  She is using minimal amounts of nasal cannula oxygen which hopefully can be weaned in the next several days.   Hyponatremia has resolved and appears to be related to some mild SIADH and fluid restriction as recommended above.  Discharge Diagnoses:  Principal Problem:   Pneumonia Active Problems:   Controlled type 2 diabetes mellitus without complication, without long-term current use of insulin (HCC)   Essential hypertension   Hyponatremia   Generalized anxiety disorder   Acute respiratory failure with hypoxia (HCC)   GERD (gastroesophageal reflux disease)  Principal discharge diagnosis: Acute hypoxemic respiratory failure secondary to community-acquired pneumonia.  Mild hyponatremia related to SIADH.  Discharge Instructions  Discharge Instructions     Diet - low sodium heart healthy   Complete by: As directed    Increase activity slowly   Complete by: As directed    No wound care   Complete by: As directed       Allergies as of 02/23/2022       Reactions   Hydrocortisone Itching   Lisinopril    Other reaction(s): Lethargy (intolerance)   Phenergan [promethazine Hcl] Other (See Comments)   hallucinations   Statins Other (See Comments)   Severe myalgias   Trazodone And Nefazodone Cough   Prednisone Rash        Medication List     TAKE these medications    acetaminophen 500 MG tablet Commonly known as: TYLENOL Take 1 tablet (500 mg total) by mouth every 6 (six) hours as needed. What changed: reasons to take this   Allergy Relief 10 MG tablet Generic drug: loratadine TAKE (1) TABLET BY MOUTH ONCE DAILY. What changed: See the new instructions.   ALPRAZolam 0.25 MG tablet Commonly known as: XANAX Take 1 tablet (0.25 mg total) by mouth 2 (  two) times daily.   amLODipine 10 MG tablet Commonly known as: NORVASC Take 1 tablet (10 mg total) by mouth daily.   Anti-Diarrheal 2 MG tablet Generic drug: loperamide TAKE 1 TABLET BY MOUTH 3 TIMES A DAY AS NEEDED FOR DIARRHEA. What changed: See the new instructions.   benzonatate 100 MG capsule Commonly known as:  TESSALON Take 1 capsule (100 mg total) by mouth 3 (three) times daily as needed for cough.   PreserVision AREDS 2 Caps Take 1 capsule by mouth in the morning and at bedtime. What changed: Another medication with the same name was changed. Make sure you understand how and when to take each.   beta carotene w/minerals tablet TAKE 1 TABLET BY MOUTH AT BEDTIME. What changed: when to take this   chlorpheniramine-HYDROcodone 10-8 MG/5ML Commonly known as: TUSSIONEX Take 5 mLs by mouth every 12 (twelve) hours for 5 days.   Combivent Respimat 20-100 MCG/ACT Aers respimat Generic drug: Ipratropium-Albuterol Inhale 1 puff into the lungs every 6 (six) hours as needed for wheezing or shortness of breath.   dicyclomine 10 MG capsule Commonly known as: Bentyl 1 bid prn abd cramps and loose stool What changed:  how much to take how to take this when to take this reasons to take this additional instructions   EasyMax Test test strip Generic drug: glucose blood CHECK BLOOD SUGAR ONCE DAILY.(CALL MD IF BS BELOW 60: OR IF BS ABOVE 400)   famotidine 20 MG tablet Commonly known as: PEPCID TAKE 1 TABLET BY MOUTH EACH MORNING. What changed: See the new instructions.   Fish Oil 1000 MG Caps One capsule BID What changed:  how much to take how to take this when to take this additional instructions   gabapentin 100 MG capsule Commonly known as: NEURONTIN Take 300 mg by mouth 2 (two) times daily.   Geri-Tussin DM 10-100 MG/5ML liquid Generic drug: Dextromethorphan-guaiFENesin Take 5 mLs by mouth every 4 (four) hours as needed for cough.   GoodSense Hemorrhoidal 0.25-14-74.9 % rectal ointment Generic drug: phenylephrine-shark liver oil-mineral oil-petrolatum Place 1 Application rectally 3 (three) times daily as needed for irritation.   guaiFENesin 600 MG 12 hr tablet Commonly known as: MUCINEX Take 1 tablet (600 mg total) by mouth 2 (two) times daily for 10 days.   hydrALAZINE 25 MG  tablet Commonly known as: APRESOLINE Take 1 tablet (25 mg total) by mouth 2 (two) times daily.   hydrOXYzine 25 MG tablet Commonly known as: ATARAX TAKE 1 TABLET BY MOUTH 3 TIMES A DAY AS NEEDED FOR ITCHING. What changed: See the new instructions.   losartan 100 MG tablet Commonly known as: COZAAR TAKE (1) TABLET BY MOUTH ONCE DAILY. What changed: See the new instructions.   metFORMIN 500 MG tablet Commonly known as: GLUCOPHAGE Take one tablet '500mg'$  po in the morning and 1/2 tablet 250 mg po at supper What changed:  how much to take how to take this when to take this additional instructions   pantoprazole 20 MG tablet Commonly known as: PROTONIX TAKE (1) TABLET BY MOUTH ONCE DAILY. What changed:  how much to take how to take this when to take this additional instructions   predniSONE 10 MG tablet Commonly known as: DELTASONE Take 4 tablets (40 mg total) by mouth daily for 5 days.   saccharomyces boulardii 250 MG capsule Commonly known as: FLORASTOR Take 1 capsule (250 mg total) by mouth 2 (two) times daily.   Theratears 0.25 % Soln Generic drug: Carboxymethylcellulose Sodium Apply  1 drop to eye in the morning and at bedtime.   Vitamin B-12 2500 MCG Subl TAKE 1/2 TABLET (1250MCG) BY MOUTH AT BEDTIME. What changed: See the new instructions.   Vitamin D (Cholecalciferol) 10 MCG (400 UNIT) Tabs TAKE 1 TABLET BY MOUTH ONCE DAILY.        Contact information for follow-up providers     Luking, Elayne Snare, MD. Schedule an appointment as soon as possible for a visit.   Specialty: Family Medicine Contact information: [place] [address] [phone]              Contact information for after-discharge care     [facility] .   Service: Skilled Nursing Contact information: [address] [place] [address] [phone]                    Allergies   Allergen Reactions   Hydrocortisone Itching   Lisinopril     Other reaction(s): Lethargy (intolerance)   Phenergan [Promethazine Hcl] Other (See Comments)    hallucinations   Statins Other (See Comments)    Severe myalgias   Trazodone And Nefazodone Cough   Prednisone Rash    Consultations: None   Procedures/Studies: DG CHEST PORT 1 VIEW  Result Date: 02/22/2022 CLINICAL DATA:  Cough, shortness of breath EXAM: PORTABLE CHEST 1 VIEW COMPARISON:  02/17/2022 FINDINGS: Stable cardiomediastinal contours. Aortic atherosclerosis. Progressive right middle lobe atelectasis/consolidation. Increasing streaky left basilar opacities with small left pleural effusion. No pneumothorax. IMPRESSION: 1. Progressive right middle lobe atelectasis/consolidation. 2. Increasing streaky left basilar opacities with small left pleural effusion, could reflect atelectasis or developing infection. Electronically Signed   By: Davina Poke D.O.   On: 02/22/2022 10:07   DG Chest 2 View  Result Date: 02/17/2022 CLINICAL DATA:  Cough, congestion EXAM: CHEST - 2 VIEW COMPARISON:  02/27/2021 FINDINGS: Bilateral mild interstitial thickening. Right middle lobe airspace disease which may reflect atelectasis versus pneumonia. No pleural effusion or pneumothorax. Heart and mediastinal contours are unremarkable. No acute osseous abnormality. IMPRESSION: 1. Right middle lobe airspace disease which may reflect atelectasis versus pneumonia. Electronically Signed   By: Kathreen Devoid M.D.   On: 02/17/2022 16:54     Discharge Exam: Vitals:   02/23/22 0507 02/23/22 0938  BP: (!) 139/58   Pulse: 73   Resp: 17   Temp: 97.8 F (36.6 C)   SpO2: 92% 96%   Vitals:   02/22/22 2018 02/22/22 2052 02/23/22 0507 02/23/22 0938  BP:  (!) 142/61 (!) 139/58   Pulse: 94 87 73   Resp: '18 18 17   '$ Temp:  98.8 F (37.1 C) 97.8 F (36.6 C)   TempSrc:  Oral    SpO2: 94% 93% 92% 96%  Weight:      Height:        General: Pt is alert,  awake, not in acute distress Cardiovascular: RRR, S1/S2 +, no rubs, no gallops Respiratory: CTA bilaterally, no wheezing, no rhonchi, on 2 L nasal cannula Abdominal: Soft, NT, ND, bowel sounds + Extremities: no edema, no cyanosis    The results of significant diagnostics from this hospitalization (including imaging, microbiology, ancillary and laboratory) are listed below for reference.     Microbiology: Recent Results (from the past 240 hour(s))  SARS Coronavirus 2 by RT PCR (hospital order, performed in [facility] hospital lab) *cepheid single result test* Anterior Nasal Swab  Status: None   Collection Time: 02/17/22  6:21 PM   Specimen: Anterior Nasal Swab  Result Value Ref Range Status   SARS Coronavirus 2 by RT PCR NEGATIVE NEGATIVE Final    Comment: (NOTE) SARS-CoV-2 target nucleic acids are NOT DETECTED.  The SARS-CoV-2 RNA is generally detectable in upper and lower respiratory specimens during the acute phase of infection. The lowest concentration of SARS-CoV-2 viral copies this assay can detect is 250 copies / mL. A negative result does not preclude SARS-CoV-2 infection and should not be used as the sole basis for treatment or other patient management decisions.  A negative result may occur with improper specimen collection / handling, submission of specimen other than nasopharyngeal swab, presence of viral mutation(s) within the areas targeted by this assay, and inadequate number of viral copies (<250 copies / mL). A negative result must be combined with clinical observations, patient history, and epidemiological information.  Fact Sheet for Patients:   https://www.patel.info/  Fact Sheet for Healthcare Providers: https://hall.com/  This test is not yet approved or  cleared by the [place] FDA and has been authorized for detection and/or diagnosis of SARS-CoV-2 by FDA under an Emergency Use Authorization (EUA).   This EUA will remain in effect (meaning this test can be used) for the duration of the COVID-19 declaration under Section 564(b)(1) of the Act, 21 U.S.C. section 360bbb-3(b)(1), unless the authorization is terminated or revoked sooner.  Performed at [facility], [address] [place], [place] [address]   Blood culture (routine x 2)     Status: None   Collection Time: 02/17/22  7:10 PM   Specimen: BLOOD RIGHT HAND  Result Value Ref Range Status   Specimen Description BLOOD RIGHT HAND  Final   Special Requests   Final    BOTTLES DRAWN AEROBIC AND ANAEROBIC Blood Culture adequate volume   Culture   Final    NO GROWTH 5 DAYS Performed at [facility], [address], [place], [place] [address]    Report Status 02/22/2022 FINAL  Final  Blood culture (routine x 2)     Status: None   Collection Time: 02/17/22  7:20 PM   Specimen: BLOOD LEFT HAND  Result Value Ref Range Status   Specimen Description BLOOD LEFT HAND  Final   Special Requests   Final    BOTTLES DRAWN AEROBIC AND ANAEROBIC Blood Culture adequate volume   Culture   Final    NO GROWTH 5 DAYS Performed at [facility], [address], [place], [place] [address]    Report Status 02/22/2022 FINAL  Final     Labs: BNP (last 3 results) No results for input(s): "BNP" in the last 8760 hours. Basic Metabolic Panel: Recent Labs  Lab 02/19/22 0421 02/20/22 0605 02/21/22 0619 02/22/22 0420 02/23/22 0540  NA 128* 133* 134* 136 135  K 3.7 3.5 3.6 3.9 4.0  CL 95* 97* 99 99 97*  CO2 '27 29 28 30 28  '$ GLUCOSE 149* 144* 140* 137* 206*  BUN 10 7* '9 11 17  '$ CREATININE 0.67 0.61 0.68 0.63 0.68  CALCIUM 7.9* 8.5* 8.5* 8.4* 9.1  MG 1.9 1.8 1.7 1.7 1.9   Liver Function Tests: Recent Labs  Lab 02/18/22 0554  AST 12*  ALT 11  ALKPHOS 72  BILITOT 0.8  PROT 5.9*  ALBUMIN 2.8*   No results for input(s): "LIPASE", "AMYLASE" in the last 168 hours. No results for input(s): "AMMONIA" in the last 168 hours. CBC: Recent Labs  Lab  02/17/22 1622 02/18/22 0554 02/19/22 0421 02/21/22 0619 02/22/22 0420 02/23/22 0540  WBC 15.1* 13.1* 8.9 10.0 10.3 13.6*  NEUTROABS 12.4* 9.8*  --   --   --   --   HGB 11.6* 9.7* 9.7* 9.7* 9.0* 10.6*  HCT 34.8* 29.3* 29.3* 30.5* 28.2* 32.6*  MCV 86.4 87.5 86.9 89.7 89.0 87.4  PLT 293 257 262 319 345 428*   Cardiac Enzymes: No results for input(s): "CKTOTAL", "CKMB", "CKMBINDEX", "TROPONINI" in the last 168 hours. BNP: Invalid input(s): "POCBNP" CBG: Recent Labs  Lab 02/22/22 0742 02/22/22 1102 02/22/22 1711 02/22/22 2059 02/23/22 0715  GLUCAP 154* 268* 243* 250* 203*   D-Dimer No results for input(s): "DDIMER" in the last 72 hours. Hgb A1c No results for input(s): "HGBA1C" in the last 72 hours. Lipid Profile No results for input(s): "CHOL", "HDL", "LDLCALC", "TRIG", "CHOLHDL", "LDLDIRECT" in the last 72 hours. Thyroid function studies No results for input(s): "TSH", "T4TOTAL", "T3FREE", "THYROIDAB" in the last 72 hours.  Invalid input(s): "FREET3" Anemia work up No results for input(s): "VITAMINB12", "FOLATE", "FERRITIN", "TIBC", "IRON", "RETICCTPCT" in the last 72 hours. Urinalysis    Component Value Date/Time   COLORURINE YELLOW 02/26/2021 [place] 02/26/2021 1705   LABSPEC 1.020 02/26/2021 1705   PHURINE 5.0 02/26/2021 1705   GLUCOSEU 50 (A) 02/26/2021 1705   HGBUR NEGATIVE 02/26/2021 [place] 02/26/2021 [place] 02/26/2021 1705   PROTEINUR NEGATIVE 02/26/2021 1705   UROBILINOGEN 0.2 12/15/2014 1956   NITRITE NEGATIVE 02/26/2021 1705   LEUKOCYTESUR NEGATIVE 02/26/2021 1705   Sepsis Labs Recent Labs  Lab 02/19/22 0421 02/21/22 0619 02/22/22 0420 02/23/22 0540  WBC 8.9 10.0 10.3 13.6*   Microbiology Recent Results (from the past 240 hour(s))  SARS Coronavirus 2 by RT PCR (hospital order, performed in [place] hospital lab) *cepheid single result test* Anterior Nasal Swab     Status: None    Collection Time: 02/17/22  6:21 PM   Specimen: Anterior Nasal Swab  Result Value Ref Range Status   SARS Coronavirus 2 by RT PCR NEGATIVE NEGATIVE Final    Comment: (NOTE) SARS-CoV-2 target nucleic acids are NOT DETECTED.  The SARS-CoV-2 RNA is generally detectable in upper and lower respiratory specimens during the acute phase of infection. The lowest concentration of SARS-CoV-2 viral copies this assay can detect is 250 copies / mL. A negative result does not preclude SARS-CoV-2 infection and should not be used as the sole basis for treatment or other patient management decisions.  A negative result may occur with improper specimen collection / handling, submission of specimen other than nasopharyngeal swab, presence of viral mutation(s) within the areas targeted by this assay, and inadequate number of viral copies (<250 copies / mL). A negative result must be combined with clinical observations, patient history, and epidemiological information.  Fact Sheet for Patients:   https://www.patel.info/  Fact Sheet for Healthcare Providers: https://hall.com/  This test is not yet approved or  cleared by the [place] FDA and has been authorized for detection and/or diagnosis of SARS-CoV-2 by FDA under an Emergency Use Authorization (EUA).  This EUA will remain in effect (meaning this test can be used) for the duration of the COVID-19 declaration under Section 564(b)(1) of the Act, 21 U.S.C. section 360bbb-3(b)(1), unless the authorization is terminated or revoked sooner.  Performed at [facility], [address], [place], [place] [address]   Blood culture (routine x 2)     Status: None  Collection Time: 02/17/22  7:10 PM   Specimen: BLOOD RIGHT HAND  Result Value Ref Range Status   Specimen Description BLOOD RIGHT HAND  Final   Special Requests   Final    BOTTLES DRAWN AEROBIC AND ANAEROBIC Blood Culture adequate volume   Culture    Final    NO GROWTH 5 DAYS Performed at [facility], [address], [place], [place] [address]    Report Status 02/22/2022 FINAL  Final  Blood culture (routine x 2)     Status: None   Collection Time: 02/17/22  7:20 PM   Specimen: BLOOD LEFT HAND  Result Value Ref Range Status   Specimen Description BLOOD LEFT HAND  Final   Special Requests   Final    BOTTLES DRAWN AEROBIC AND ANAEROBIC Blood Culture adequate volume   Culture   Final    NO GROWTH 5 DAYS Performed at [facility], [address], [place], [place] [address]    Report Status 02/22/2022 FINAL  Final     Time coordinating discharge: 35 minutes  SIGNED:   Rodena Goldmann, DO [facility] 02/23/2022, 10:25 AM  If 7PM-7AM, please contact night-coverage [email or web address]

## 2022-02-25 ENCOUNTER — Encounter: Payer: Self-pay | Admitting: Adult Health

## 2022-02-25 ENCOUNTER — Non-Acute Institutional Stay (SKILLED_NURSING_FACILITY): Payer: Medicare Other | Admitting: Adult Health

## 2022-02-25 DIAGNOSIS — E1142 Type 2 diabetes mellitus with diabetic polyneuropathy: Secondary | ICD-10-CM | POA: Diagnosis not present

## 2022-02-25 DIAGNOSIS — J9601 Acute respiratory failure with hypoxia: Secondary | ICD-10-CM | POA: Diagnosis not present

## 2022-02-25 DIAGNOSIS — E1169 Type 2 diabetes mellitus with other specified complication: Secondary | ICD-10-CM | POA: Diagnosis not present

## 2022-02-25 DIAGNOSIS — E785 Hyperlipidemia, unspecified: Secondary | ICD-10-CM

## 2022-02-25 DIAGNOSIS — J3089 Other allergic rhinitis: Secondary | ICD-10-CM

## 2022-02-25 DIAGNOSIS — E43 Unspecified severe protein-calorie malnutrition: Secondary | ICD-10-CM | POA: Diagnosis not present

## 2022-02-25 DIAGNOSIS — F419 Anxiety disorder, unspecified: Secondary | ICD-10-CM | POA: Diagnosis not present

## 2022-02-25 DIAGNOSIS — K219 Gastro-esophageal reflux disease without esophagitis: Secondary | ICD-10-CM | POA: Diagnosis not present

## 2022-02-25 DIAGNOSIS — I152 Hypertension secondary to endocrine disorders: Secondary | ICD-10-CM | POA: Insufficient documentation

## 2022-02-25 DIAGNOSIS — E1151 Type 2 diabetes mellitus with diabetic peripheral angiopathy without gangrene: Secondary | ICD-10-CM

## 2022-02-25 DIAGNOSIS — E1159 Type 2 diabetes mellitus with other circulatory complications: Secondary | ICD-10-CM | POA: Diagnosis not present

## 2022-02-25 DIAGNOSIS — J189 Pneumonia, unspecified organism: Secondary | ICD-10-CM | POA: Diagnosis not present

## 2022-02-25 DIAGNOSIS — I700 Atherosclerosis of aorta: Secondary | ICD-10-CM | POA: Diagnosis not present

## 2022-02-25 DIAGNOSIS — E222 Syndrome of inappropriate secretion of antidiuretic hormone: Secondary | ICD-10-CM | POA: Diagnosis not present

## 2022-02-25 HISTORY — DX: Type 2 diabetes mellitus with diabetic polyneuropathy: E11.42

## 2022-02-25 NOTE — Progress Notes (Addendum)
Location:  [facility] Room Number: 122w Place of Service:  SNF (31)   CODE STATUS: dnr   Allergies  Allergen Reactions   Hydrocortisone Itching   Lisinopril     Other reaction(s): Lethargy (intolerance)   Phenergan [Promethazine Hcl] Other (See Comments)    hallucinations   Statins Other (See Comments)    Severe myalgias   Trazodone And Nefazodone Cough   Prednisone Rash    Chief Complaint  Patient presents with   Hospitalization Follow-up    HPI:  She is a 88 year old woman who has been hospitalized from 02-17-22 through 02-23-22. Her past medical history includes: dementia; diabetes type 2; hypertension: GERD. She presented to the ED. 6 days prior started with sore throat progressed to cough and dyspnea. She was given tesselon perles without relief. Her cough was nonproductive; increased fatigue and myalgias.  She was admitted with community acquired pneumonia right middle lobe with acuate hypoxemic respiratory failure. She had mild hyponatremia related to mild SIADH; is on 2 liter fluid restriction.  She was treated with IV abt and antitussives with an addition of steroids to get symptoms under control. She will need to complete her steroids. She will need short term rehab. She lives in assisted living and her goal is to return back there. She does have a cough is using I/S and flutter valve. No chest pain; no shortness of breath; does get easily fatigued. She will continue to be followed for her chronic illnesses including:  Hyperlipidemia associated with type 2 diabetes mellitus:   Hypertension associated with diabetes mellitus type 2:  Type 2 diabetes mellitus with atherosclerosis of aorta; Aortic atherosclerosis:    Past Medical History:  Diagnosis Date   Cognitive dysfunction 03/04/2019   Patient scores 21 out of 30 on [place] cognitive assessment September 2020   Diabetes mellitus without complication ([place])    Diverticulitis    Frailty 03/04/2019    H/O bilateral breast reduction surgery    Hyperlipidemia    a. intolerant to statins.    Hypertension     Past Surgical History:  Procedure Laterality Date   ABDOMINAL HYSTERECTOMY     APPENDECTOMY     BREAST REDUCTION SURGERY     COLON SURGERY Left    Hemicolectomy due to diverticulitis   EYE SURGERY  03/30/2009   cataract   KNEE SURGERY     LAPAROSCOPIC INCISIONAL / UMBILICAL / VENTRAL HERNIA REPAIR  02/23/2007    REDUCTION MAMMAPLASTY Bilateral 2001   REFRACTIVE SURGERY      Social History   Socioeconomic History   Marital status: Widowed    Spouse name: Not on file   Number of children: Not on file   Years of education: Not on file   Highest education level: Not on file  Occupational History   Not on file  Tobacco Use   Smoking status: Never   Smokeless tobacco: Never  Vaping Use   Vaping Use: Never used  Substance and Sexual Activity   Alcohol use: No   Drug use: No   Sexual activity: Never    Birth control/protection: Abstinence  Other Topics Concern   Not on file  Social History Narrative   Not on file   Social Determinants of Health   Financial Resource Strain: Low Risk  (08/28/2021)   Overall Financial Resource Strain (CARDIA)    Difficulty of Paying Living Expenses: Not hard at all  Food Insecurity: No Food Insecurity (08/28/2021)   Hunger  Vital Sign    Worried About Charity fundraiser in the Last Year: Never true    Ran Out of Food in the Last Year: Never true  Transportation Needs: No Transportation Needs (08/28/2021)   PRAPARE - Hydrologist (Medical): No    Lack of Transportation (Non-Medical): No  Physical Activity: Sufficiently Active (08/28/2021)   Exercise Vital Sign    Days of Exercise per Week: 5 days    Minutes of Exercise per Session: 30 min  Stress: No Stress Concern Present (08/28/2021)   [place]    Feeling of Stress : Not at all   Social Connections: Moderately Integrated (08/28/2021)   Social Connection and Isolation Panel [NHANES]    Frequency of Communication with Friends and Family: More than three times a week    Frequency of Social Gatherings with Friends and Family: More than three times a week    Attends Religious Services: More than 4 times per year    Active Member of [employer] or Organizations: Yes    Attends Archivist Meetings: More than 4 times per year    Marital Status: Widowed  Intimate Partner Violence: Not At Risk (08/28/2021)   Humiliation, Afraid, Rape, and Kick questionnaire    Fear of Current or Ex-Partner: No    Emotionally Abused: No    Physically Abused: No    Sexually Abused: No   Family History  Problem Relation Age of Onset   Hypertension Father        kidney   Stroke Father    Cancer Father    Cancer Mother        ovaian   Diabetes Brother    Hypertension Sister       VITAL SIGNS BP 139/70   Pulse 70   Temp 97.8 F (36.6 C)   Resp 20   Ht '5\' 8"'$  (1.727 m)   Wt 183 lb 3.2 oz (83.1 kg)   SpO2 94%   BMI 27.86 kg/m   Outpatient Encounter Medications as of 02/25/2022  Medication Sig   acetaminophen (ACETAMINOPHEN EXTRA STRENGTH) 500 MG tablet Take 1 tablet (500 mg total) by mouth every 6 (six) hours as needed.   ALPRAZolam (XANAX) 0.25 MG tablet Take 1 tablet (0.25 mg total) by mouth 2 (two) times daily.   amLODipine (NORVASC) 10 MG tablet Take 1 tablet (10 mg total) by mouth daily.   ANTI-DIARRHEAL 2 MG tablet TAKE 1 TABLET BY MOUTH 3 TIMES A DAY AS NEEDED FOR DIARRHEA.   benzonatate (TESSALON) 100 MG capsule Take 1 capsule (100 mg total) by mouth 3 (three) times daily as needed for cough.   beta carotene w/minerals (OCUVITE) tablet TAKE 1 TABLET BY MOUTH AT BEDTIME.   Carboxymethylcellulose Sodium (THERATEARS) 0.25 % SOLN Apply 1 drop to eye in the morning and at bedtime.   chlorpheniramine-HYDROcodone (TUSSIONEX) 10-8 MG/5ML Take 5 mLs by mouth every 12 (twelve)  hours for 5 days.   Cyanocobalamin (VITAMIN B-12) 2500 MCG SUBL TAKE 1/2 TABLET (1250MCG) BY MOUTH AT BEDTIME.   dicyclomine (BENTYL) 10 MG capsule 1 bid prn abd cramps and loose stool   famotidine (PEPCID) 20 MG tablet TAKE 1 TABLET BY MOUTH EACH MORNING.   gabapentin (NEURONTIN) 100 MG capsule Take 300 mg by mouth 2 (two) times daily.   GERI-TUSSIN DM 10-100 MG/5ML liquid Take 5 mLs by mouth every 4 (four) hours as needed for cough.   GOODSENSE  HEMORRHOIDAL 0.25-14-74.9 % rectal ointment Place 1 Application rectally 3 (three) times daily as needed for irritation.   guaiFENesin (MUCINEX) 600 MG 12 hr tablet Take 1 tablet (600 mg total) by mouth 2 (two) times daily for 10 days.   hydrALAZINE (APRESOLINE) 25 MG tablet Take 1 tablet (25 mg total) by mouth 2 (two) times daily.   hydrOXYzine (ATARAX) 25 MG tablet TAKE 1 TABLET BY MOUTH 3 TIMES A DAY AS NEEDED FOR ITCHING.   Ipratropium-Albuterol (COMBIVENT RESPIMAT) 20-100 MCG/ACT AERS respimat Inhale 1 puff into the lungs every 6 (six) hours as needed for wheezing or shortness of breath.   loratadine (CLARITIN) 10 MG tablet Take 10 mg by mouth daily.   losartan (COZAAR) 100 MG tablet TAKE (1) TABLET BY MOUTH ONCE DAILY.   Multiple Vitamins-Minerals (PRESERVISION AREDS 2) CAPS Take 1 capsule by mouth in the morning and at bedtime.   Omega-3 Fatty Acids (FISH OIL) 1000 MG CAPS One capsule BID   pantoprazole (PROTONIX) 20 MG tablet TAKE (1) TABLET BY MOUTH ONCE DAILY.   predniSONE (DELTASONE) 10 MG tablet Take 4 tablets (40 mg total) by mouth daily for 5 days.   saccharomyces boulardii (FLORASTOR) 250 MG capsule Take 1 capsule (250 mg total) by mouth 2 (two) times daily.   Vitamin D, Cholecalciferol, 10 MCG (400 UNIT) TABS TAKE 1 TABLET BY MOUTH ONCE DAILY.   [DISCONTINUED] ALLERGY RELIEF 10 MG tablet TAKE (1) TABLET BY MOUTH ONCE DAILY. (Patient taking differently: Take 10 mg by mouth daily.)   EASYMAX TEST test strip CHECK BLOOD SUGAR ONCE  DAILY.(CALL MD IF BS BELOW 60: OR IF BS ABOVE 400)   metFORMIN (GLUCOPHAGE) 500 MG tablet Take one tablet '500mg'$  po in the morning and 1/2 tablet 250 mg po at supper   No facility-administered encounter medications on file as of 02/25/2022.     SIGNIFICANT DIAGNOSTIC EXAMS  TODAY  02-17-22: chest x-ray: 1. Right middle lobe airspace disease which may reflect atelectasis versus pneumonia  02-22-22: chest x-ray:  1. Progressive right middle lobe atelectasis/consolidation. 2. Increasing streaky left basilar opacities with small left pleural effusion, could reflect atelectasis or developing infection.  LABS  REVIEWED:   11-27-21: hgb a1c 7.0; chol 188; ldl 93; trig 324; hdl 41 02-17-22: wbc 15.1; hgb 11.6; hct 34.8; mcv 86.4 plt 293; glucose 169; bun 12; creat 0.72; k+ 4.3; na++ 123; ca 8.7; gfr >60 02-18-22: wbc 13.1; hgb 9.7; hct 29.3 mcv 87.5 plt 257; glucose 135; bun 12; creat 0.70; k+ 3.9; na++ 126; ca 7.9; gfr >60; protein 5.9; albumin 2.3; mag 1.5 tsh 1.342 02-23-22: wbc 13.6; hgb 10.6; hct 32.6; mcv 87.4 plt 428; glucose 206; bun 17; creat 0.68; k+ 4.0; na++ 135; ca 9.1; gfr >60; mag 1.9    Review of Systems  Constitutional:  Negative for malaise/fatigue.  Respiratory:  Positive for cough. Negative for shortness of breath.   Cardiovascular:  Negative for chest pain, palpitations and leg swelling.  Gastrointestinal:  Negative for abdominal pain, constipation and heartburn.  Musculoskeletal:  Negative for back pain, joint pain and myalgias.  Skin: Negative.   Neurological:  Negative for dizziness.  Psychiatric/Behavioral:  The patient is not nervous/anxious.     Physical Exam Constitutional:      General: She is not in acute distress.    Appearance: She is well-developed. She is not diaphoretic.  HENT:     Nose: Nose normal.     Mouth/Throat:     Mouth: Mucous membranes are moist.  Pharynx: Oropharynx is clear.  Eyes:     Conjunctiva/sclera: Conjunctivae normal.  Neck:      Thyroid: No thyromegaly.  Cardiovascular:     Rate and Rhythm: Normal rate and regular rhythm.     Heart sounds: Normal heart sounds.  Pulmonary:     Effort: Pulmonary effort is normal. No respiratory distress.     Breath sounds: Normal breath sounds.  Abdominal:     General: Bowel sounds are normal. There is no distension.     Palpations: Abdomen is soft.     Tenderness: There is no abdominal tenderness.  Musculoskeletal:        General: Normal range of motion.     Cervical back: Neck supple.     Right lower leg: No edema.     Left lower leg: No edema.  Lymphadenopathy:     Cervical: No cervical adenopathy.  Skin:    General: Skin is warm and dry.  Neurological:     Mental Status: She is alert and oriented to person, place, and time.  Psychiatric:        Mood and Affect: Mood normal.      ASSESSMENT/ PLAN:  TODAY  Pneumonia of right middle lobe due to infectious organism: will complete prednisone. Has complete 7 days of abt. Will continue tesselon 100 mg every 8 hours as needed; tussionec 5 cc twice daily for 5 days; mucinex twice daily for 10 days; prednisone 10 mg daily for 5 days.   2. Acute respiratory failure with hypoxia: is presently on 02 will wean off as able  3. Hyperlipidemia associated with type 2 diabetes mellitus: ldl 93 will continue to monitor her status.   4. Hypertension associated with diabetes mellitus type 2: b/p 139/70: will continue norvasc 10 mg daily; apresoline 25 mg twice daily; cozaar 100 mg daily   5. Type 2 diabetes mellitus with atherosclerosis of aorta; hgb a1c 7.0 will continue metfromin 500 mg in the AM and 250 mg in the PM  6. Aortic atherosclerosis: ( ct 02-25-21) is not on statin due to advanced age  61. Gastroesophageal reflux disease without esophagitis: will continue pepcid 20 mg in the AM and protonix 20 mg in the PM. Has a history of c-difficile; is on probiotic; has bentyl twice daily as needed  8. Chronic non-seasonal allergic  rhinitis: will continue claritin 10 mg daily   9. Diabetic peripheral neuropathy associated with  type 2 diabetes mellitus: will continue gabapentin 300 mg twice daily   10. Protein calorie malnutrition severe: albumin 2.3 protein 5.9 will begin prostat 30 mL with meals  11. Chronic anxiety: will continue xanax 0.25 mg twice daily  12. SIADH: na++ 135 is on 2 liter fluid restriction    On 03-04-22: will check cbc; bmp   Ok Edwards NP [facility]   call [phone]

## 2022-02-26 ENCOUNTER — Non-Acute Institutional Stay (SKILLED_NURSING_FACILITY): Payer: Medicare Other | Admitting: Internal Medicine

## 2022-02-26 ENCOUNTER — Encounter: Payer: Self-pay | Admitting: Internal Medicine

## 2022-02-26 DIAGNOSIS — E1151 Type 2 diabetes mellitus with diabetic peripheral angiopathy without gangrene: Secondary | ICD-10-CM | POA: Diagnosis not present

## 2022-02-26 DIAGNOSIS — I700 Atherosclerosis of aorta: Secondary | ICD-10-CM

## 2022-02-26 DIAGNOSIS — E43 Unspecified severe protein-calorie malnutrition: Secondary | ICD-10-CM

## 2022-02-26 DIAGNOSIS — E222 Syndrome of inappropriate secretion of antidiuretic hormone: Secondary | ICD-10-CM

## 2022-02-26 DIAGNOSIS — J189 Pneumonia, unspecified organism: Secondary | ICD-10-CM

## 2022-02-26 NOTE — Patient Instructions (Signed)
See assessment and plan under each diagnosis in the problem list and acutely for this visit 

## 2022-02-26 NOTE — Progress Notes (Unsigned)
NURSING HOME LOCATION:  [facility] ROOM NUMBER:  14 W  CODE STATUS: DNR   PCP: Sallee Lange MD  This is a comprehensive admission note to this SNFperformed on this date less than 30 days from date of admission. Included are preadmission medical/surgical history; reconciled medication list; family history; social history and comprehensive review of systems.  Corrections and additions to the records were documented. Comprehensive physical exam was also performed. Additionally a clinical summary was entered for each active diagnosis pertinent to this admission in the Problem List to enhance continuity of care.  HPI: She was hospitalized 8/20 - 02/23/2022 presenting to the ED with dyspnea.  Right middle lobe community-acquired pneumonia was documented complicated by acute hypoxic respiratory failure.  This was treated with IV antibiotics, antitussives, and steroid burst for control of dyspnea.  7-day course of Rocephin and azithromycin was completed.  Steroids were to be continued at discharge because of residual symptoms. Course was complicated by hyponatremia with a nadir sodium of 126.  In the context of the steroid ministration glucoses while hospitalized ranged from a low of 135 up to 268.  The most recent A1c on record was 7% on 11/27/2021.  She did exhibit a normochromic, normocytic anemia with H/H of 10.6/32.6.  At the time of discharge clinically she did not have active infection but residual leukocytosis was present with a white count of 13,600.  Again this was in the context of the steroids. Past medical and surgical history:  Social history: She is a retired Licensed conveyancer.  Family history:   Review of systems: She is alert and oriented and gives an excellent history.  She states that she has been living in an ALF for 3 years because of inability to care for self and complete ADLs.  She also describes some memory defects and that she  Her major symptom has been  fatigue. She realizes that she had hyponatremia but questions why she is on a low-sodium or sodium free diet.  She states that she has improved but continues to have intermittent cough with production of thick but clear sputum.  This is associated with some dyspnea as is exertion.  She also has some clear nasal drainage.  She is having sacral discomfort lying supine.   Constitutional: No fever, significant weight change..  Eyes: No redness, discharge, pain, vision change ENT/mouth: No nasal congestion, purulent discharge, earache, change in hearing, sore throat  Cardiovascular: No chest pain, palpitations, paroxysmal nocturnal dyspnea, claudication, edema  Respiratory: No cough, sputum production, hemoptysis, DOE, significant snoring, apnea Gastrointestinal: No heartburn, dysphagia, abdominal pain, nausea /vomiting, rectal bleeding, melena, change in bowels Genitourinary: No dysuria, hematuria, pyuria, incontinence, nocturia Musculoskeletal: No joint stiffness, joint swelling, weakness, pain Dermatologic: No rash, pruritus, change in appearance of skin Neurologic: No dizziness, headache, syncope, seizures, numbness, tingling Psychiatric: No significant anxiety, depression, insomnia, anorexia Endocrine: No change in hair/skin/nails, excessive thirst, excessive hunger, excessive urination  Hematologic/lymphatic: No significant bruising, lymphadenopathy, abnormal bleeding Allergy/immunology: No itchy/watery eyes, significant sneezing, urticaria, angioedema  Physical exam:  Pertinent or positive findings: Initially she was asleep and exhibiting hypopnea without snoring or frank apnea.  The eyebrows are somewhat diminished in density especially on the left.  The left lacrimal gland is slightly prominent.  She has bilateral ptosis, greater on the left.  Larynx is prominent to palpation.  She exhibits an intermittent rapidly, nonproductive cough.  She had occasional premature on cardiac auscultation.   Musical rhonchi were noted on expiration, left  greater than right.  Abdomen is protuberant.  Posterior tibial pulses are stronger than dorsalis pedis pulses.  Strength opposition is fair to good in all extremities and symmetric.  She is wearing a brace over the right knee. General appearance: Adequately nourished; no acute distress, increased work of breathing is present.   Lymphatic: No lymphadenopathy about the head, neck, axilla. Eyes: No conjunctival inflammation or lid edema is present. There is no scleral icterus. Ears:  External ear exam shows no significant lesions or deformities.   Nose:  External nasal examination shows no deformity or inflammation. Nasal mucosa are pink and moist without lesions, exudates Oral exam: Lips and gums are healthy appearing.There is no oropharyngeal erythema or exudate. Neck:  No thyromegaly, masses, tenderness noted.    Heart:  Normal rate and regular rhythm. S1 and S2 normal without gallop, murmur, click, rub.  Lungs: Chest clear to auscultation without wheezes, rhonchi, rales, rubs. Abdomen: Bowel sounds are normal.  Abdomen is soft and nontender with no organomegaly, hernias, masses. GU: Deferred  Extremities:  No cyanosis, clubbing, edema. Neurologic exam:  Strength equal  in upper & lower extremities. Balance, Rhomberg, finger to nose testing could not be completed due to clinical state Deep tendon reflexes are equal Skin: Warm & dry w/o tenting. No significant lesions or rash.  See clinical summary under each active problem in the Problem List with associated updated therapeutic plan

## 2022-02-26 NOTE — Assessment & Plan Note (Signed)
Glucoses while hospitalized ranged from a low of 135 up to 268.  As noted most recent A1c was 7% on 11/27/2021 indicating adequate control.  Glucoses will be monitored closely at the SNF because of the recent need for steroids to treat her COPD exacerbation with acute hypoxic respiratory failure in the setting of right middle lobe pneumonia.

## 2022-02-26 NOTE — Assessment & Plan Note (Signed)
Nadir sodium was 126; this will be monitored closely at the SNF.

## 2022-02-27 NOTE — Assessment & Plan Note (Signed)
Current total protein 5.9 & albumin 2.8. Nutrition consult @ SNF.

## 2022-02-27 NOTE — Assessment & Plan Note (Signed)
Clinically improved but residual bronchitic signs & symptoms necessitate continuing pulmonary toilet.

## 2022-02-28 ENCOUNTER — Other Ambulatory Visit: Payer: Self-pay | Admitting: Adult Health

## 2022-02-28 DIAGNOSIS — G4709 Other insomnia: Secondary | ICD-10-CM

## 2022-02-28 DIAGNOSIS — F411 Generalized anxiety disorder: Secondary | ICD-10-CM

## 2022-02-28 MED ORDER — ALPRAZOLAM 0.25 MG PO TABS: 0.2500 mg | ORAL_TABLET | Freq: Two times a day (BID) | ORAL | 0 refills | Status: DC

## 2022-03-04 ENCOUNTER — Encounter (HOSPITAL_COMMUNITY)
Admission: RE | Admit: 2022-03-04 | Discharge: 2022-03-04 | Disposition: A | Discharge status: Home / Self Care | Payer: Medicare Other | Source: Skilled Nursing Facility | Attending: Adult Health | Admitting: Adult Health

## 2022-03-04 DIAGNOSIS — E1159 Type 2 diabetes mellitus with other circulatory complications: Secondary | ICD-10-CM | POA: Insufficient documentation

## 2022-03-04 LAB — CBC
HCT: 35.6 % — ABNORMAL LOW (ref 36.0–46.0)
Hemoglobin: 11.2 g/dL — ABNORMAL LOW (ref 12.0–15.0)
MCH: 28.4 pg (ref 26.0–34.0)
MCHC: 31.5 g/dL (ref 30.0–36.0)
MCV: 90.4 fL (ref 80.0–100.0)
Platelets: 399 10*3/uL (ref 150–400)
RBC: 3.94 MIL/uL (ref 3.87–5.11)
RDW: 14.6 % (ref 11.5–15.5)
WBC: 11.9 10*3/uL — ABNORMAL HIGH (ref 4.0–10.5)
nRBC: 0 % (ref 0.0–0.2)

## 2022-03-04 LAB — BASIC METABOLIC PANEL
Anion gap: 6 (ref 5–15)
BUN: 29 mg/dL — ABNORMAL HIGH (ref 8–23)
CO2: 29 mmol/L (ref 22–32)
Calcium: 8.8 mg/dL — ABNORMAL LOW (ref 8.9–10.3)
Chloride: 102 mmol/L (ref 98–111)
Creatinine, Ser: 0.98 mg/dL (ref 0.44–1.00)
GFR, Estimated: 56 mL/min — ABNORMAL LOW (ref >60)
Glucose, Bld: 141 mg/dL — ABNORMAL HIGH (ref 70–99)
Potassium: 4.3 mmol/L (ref 3.5–5.1)
Sodium: 137 mmol/L (ref 135–145)

## 2022-03-07 ENCOUNTER — Encounter: Payer: Self-pay | Admitting: Adult Health

## 2022-03-07 ENCOUNTER — Other Ambulatory Visit: Payer: Self-pay | Admitting: Adult Health

## 2022-03-07 ENCOUNTER — Non-Acute Institutional Stay (SKILLED_NURSING_FACILITY): Payer: Medicare Other | Admitting: Adult Health

## 2022-03-07 ENCOUNTER — Non-Acute Institutional Stay: Payer: Self-pay | Admitting: Adult Health

## 2022-03-07 DIAGNOSIS — E222 Syndrome of inappropriate secretion of antidiuretic hormone: Secondary | ICD-10-CM

## 2022-03-07 DIAGNOSIS — G4709 Other insomnia: Secondary | ICD-10-CM

## 2022-03-07 DIAGNOSIS — E119 Type 2 diabetes mellitus without complications: Secondary | ICD-10-CM

## 2022-03-07 DIAGNOSIS — E1142 Type 2 diabetes mellitus with diabetic polyneuropathy: Secondary | ICD-10-CM | POA: Diagnosis not present

## 2022-03-07 DIAGNOSIS — I700 Atherosclerosis of aorta: Secondary | ICD-10-CM | POA: Diagnosis not present

## 2022-03-07 DIAGNOSIS — E43 Unspecified severe protein-calorie malnutrition: Secondary | ICD-10-CM

## 2022-03-07 DIAGNOSIS — K219 Gastro-esophageal reflux disease without esophagitis: Secondary | ICD-10-CM

## 2022-03-07 DIAGNOSIS — J9601 Acute respiratory failure with hypoxia: Secondary | ICD-10-CM

## 2022-03-07 DIAGNOSIS — J189 Pneumonia, unspecified organism: Secondary | ICD-10-CM

## 2022-03-07 DIAGNOSIS — I10 Essential (primary) hypertension: Secondary | ICD-10-CM

## 2022-03-07 DIAGNOSIS — F411 Generalized anxiety disorder: Secondary | ICD-10-CM

## 2022-03-07 DIAGNOSIS — I152 Hypertension secondary to endocrine disorders: Secondary | ICD-10-CM

## 2022-03-07 MED ORDER — BENZONATATE 100 MG PO CAPS
100.0000 mg | ORAL_CAPSULE | Freq: Three times a day (TID) | ORAL | 0 refills | Status: DC | PRN
Start: 2022-03-07 — End: 2022-05-08

## 2022-03-07 MED ORDER — COMBIVENT RESPIMAT 20-100 MCG/ACT IN AERS: 1.0000 | INHALATION_SPRAY | Freq: Four times a day (QID) | RESPIRATORY_TRACT | 0 refills | Status: DC | PRN

## 2022-03-07 MED ORDER — GABAPENTIN 100 MG PO CAPS
300.0000 mg | ORAL_CAPSULE | Freq: Two times a day (BID) | ORAL | 0 refills | Status: DC
Start: 2022-03-07 — End: 2022-05-08

## 2022-03-07 MED ORDER — HYDROXYZINE HCL 25 MG PO TABS: ORAL_TABLET | ORAL | 0 refills | Status: DC

## 2022-03-07 MED ORDER — AMLODIPINE BESYLATE 10 MG PO TABS: 10.0000 mg | ORAL_TABLET | Freq: Every day | ORAL | 0 refills | Status: DC

## 2022-03-07 MED ORDER — METFORMIN HCL 500 MG PO TABS: ORAL_TABLET | ORAL | 0 refills | Status: DC

## 2022-03-07 MED ORDER — ALPRAZOLAM 0.25 MG PO TABS: 0.2500 mg | ORAL_TABLET | Freq: Two times a day (BID) | ORAL | 0 refills | Status: DC

## 2022-03-07 MED ORDER — LOSARTAN POTASSIUM 100 MG PO TABS: ORAL_TABLET | ORAL | 0 refills | Status: DC

## 2022-03-07 MED ORDER — DICYCLOMINE HCL 10 MG PO CAPS
ORAL_CAPSULE | ORAL | 0 refills | Status: DC
Start: 2022-03-07 — End: 2022-05-08

## 2022-03-07 MED ORDER — HYDRALAZINE HCL 25 MG PO TABS: 25.0000 mg | ORAL_TABLET | Freq: Two times a day (BID) | ORAL | 0 refills | Status: DC

## 2022-03-07 MED ORDER — PANTOPRAZOLE SODIUM 20 MG PO TBEC: DELAYED_RELEASE_TABLET | ORAL | 0 refills | Status: DC

## 2022-03-07 NOTE — Progress Notes (Signed)
Location:  [name] Room Number: 122 Place of Service:  SNF (31)  Provider: Synthia Innocent np   PCP: Babs Sciara, MD Patient Care Team: Babs Sciara, MD as PCP - General (Family Medicine) Wendall Stade, MD as PCP - Cardiology (Cardiology)  Extended Emergency Contact Information Primary Emergency Contact: Peckinpaugh,Jason Address: [address]          [place], [place] [address] [place] Home Phone: [phone] Mobile Phone: [phone] Relation: Son  Code Status: full  Goals of care:  Advanced Directive information    02/17/2022   10:00 PM  Advanced Directives  Does Patient Have a Medical Advance Directive? No  Would patient like information on creating a medical advance directive? No - Patient declined     Allergies  Allergen Reactions  . Hydrocortisone Itching  . Lisinopril     Other reaction(s): Lethargy (intolerance)  . Phenergan [Promethazine Hcl] Other (See Comments)    hallucinations  . Statins Other (See Comments)    Severe myalgias  . Trazodone And Nefazodone Cough  . Prednisone Rash    Chief Complaint  Patient presents with  . Discharge Note    HPI:  88 y.o. female  is being discharged to home with home health for pt. She does not need any dme. She will need her prescriptions written and will need to follow up with her medical provider. She had been hospitalized for right side pneumonia; respiratory failure with hypoxemia. She had low sodium levels; due to mild SIADH. She was admitted to this facility for acute rehab. She has participated in pt/ot to improve upon her independence with her adls. She is now ready to complete therapy on a home health basis.     Past Medical History:  Diagnosis Date  . Cognitive dysfunction 03/04/2019   Patient scores 21 out of 30 on [place] cognitive assessment September 2020  . Diabetes mellitus without complication (HCC)   . Diverticulitis   . Frailty 03/04/2019  . H/O  bilateral breast reduction surgery   . Hyperlipidemia    a. intolerant to statins.   . Hypertension     Past Surgical History:  Procedure Laterality Date  . ABDOMINAL HYSTERECTOMY    . APPENDECTOMY    . BREAST REDUCTION SURGERY    . COLON SURGERY Left    Hemicolectomy due to diverticulitis  . EYE SURGERY  03/30/2009   cataract  . KNEE SURGERY    . LAPAROSCOPIC INCISIONAL / UMBILICAL / VENTRAL HERNIA REPAIR  02/23/2007   . REDUCTION MAMMAPLASTY Bilateral 2001  . REFRACTIVE SURGERY        reports that she has never smoked. She has never used smokeless tobacco. She reports that she does not drink alcohol and does not use drugs. Social History   Socioeconomic History  . Marital status: Widowed    Spouse name: Not on file  . Number of children: Not on file  . Years of education: Not on file  . Highest education level: Not on file  Occupational History  . Not on file  Tobacco Use  . Smoking status: Never  . Smokeless tobacco: Never  Vaping Use  . Vaping Use: Never used  Substance and Sexual Activity  . Alcohol use: No  . Drug use: No  . Sexual activity: Never    Birth control/protection: Abstinence  Other Topics Concern  . Not on file  Social History Narrative  . Not on file   Social  Determinants of Health   Financial Resource Strain: Low Risk  (08/28/2021)   Overall Financial Resource Strain (CARDIA)   . Difficulty of Paying Living Expenses: Not hard at all  Food Insecurity: No Food Insecurity (08/28/2021)   Hunger Vital Sign   . Worried About Programme researcher, broadcasting/film/video in the Last Year: Never true   . Ran Out of Food in the Last Year: Never true  Transportation Needs: No Transportation Needs (08/28/2021)   PRAPARE - Transportation   . Lack of Transportation (Medical): No   . Lack of Transportation (Non-Medical): No  Physical Activity: Sufficiently Active (08/28/2021)   Exercise Vital Sign   . Days of Exercise per Week: 5 days   . Minutes of Exercise per Session: 30 min   Stress: No Stress Concern Present (08/28/2021)   [employer] of Occupational Health - Occupational Stress Questionnaire   . Feeling of Stress : Not at all  Social Connections: Moderately Integrated (08/28/2021)   Social Connection and Isolation Panel [NHANES]   . Frequency of Communication with Friends and Family: More than three times a week   . Frequency of Social Gatherings with Friends and Family: More than three times a week   . Attends Religious Services: More than 4 times per year   . Active Member of Clubs or Organizations: Yes   . Attends Banker Meetings: More than 4 times per year   . Marital Status: Widowed  Intimate Partner Violence: Not At Risk (08/28/2021)   Humiliation, Afraid, Rape, and Kick questionnaire   . Fear of Current or Ex-Partner: No   . Emotionally Abused: No   . Physically Abused: No   . Sexually Abused: No   Functional Status Survey:    Allergies  Allergen Reactions  . Hydrocortisone Itching  . Lisinopril     Other reaction(s): Lethargy (intolerance)  . Phenergan [Promethazine Hcl] Other (See Comments)    hallucinations  . Statins Other (See Comments)    Severe myalgias  . Trazodone And Nefazodone Cough  . Prednisone Rash    Pertinent  Health Maintenance Due  Topic Date Due  . INFLUENZA VACCINE  01/29/2022  . OPHTHALMOLOGY EXAM  05/21/2022  . HEMOGLOBIN A1C  05/30/2022  . FOOT EXAM  12/14/2022  . DEXA SCAN  Completed    Medications: Outpatient Encounter Medications as of 03/07/2022  Medication Sig  . acetaminophen (ACETAMINOPHEN EXTRA STRENGTH) 500 MG tablet Take 1 tablet (500 mg total) by mouth every 6 (six) hours as needed.  . ALPRAZolam (XANAX) 0.25 MG tablet Take 1 tablet (0.25 mg total) by mouth 2 (two) times daily.  Marland Kitchen amLODipine (NORVASC) 10 MG tablet Take 1 tablet (10 mg total) by mouth daily.  . ANTI-DIARRHEAL 2 MG tablet TAKE 1 TABLET BY MOUTH 3 TIMES A DAY AS NEEDED FOR DIARRHEA.  Marland Kitchen benzonatate (TESSALON) 100 MG  capsule Take 1 capsule (100 mg total) by mouth 3 (three) times daily as needed for cough.  . beta carotene w/minerals (OCUVITE) tablet TAKE 1 TABLET BY MOUTH AT BEDTIME.  . Carboxymethylcellulose Sodium (THERATEARS) 0.25 % SOLN Apply 1 drop to eye in the morning and at bedtime.  . Cyanocobalamin (VITAMIN B-12) 2500 MCG SUBL TAKE 1/2 TABLET ( ) BY MOUTH AT BEDTIME.  Marland Kitchen dicyclomine (BENTYL) 10 MG capsule 1 bid prn abd cramps and loose stool  . EASYMAX TEST test strip CHECK BLOOD SUGAR ONCE DAILY.(CALL MD IF BS BELOW 60: OR IF BS ABOVE 400)  . famotidine (PEPCID) 20 MG tablet TAKE  1 TABLET BY MOUTH EACH MORNING.  Marland Kitchen gabapentin (NEURONTIN) 100 MG capsule Take 3 capsules (300 mg total) by mouth 2 (two) times daily.  Marland Kitchen GERI-TUSSIN DM 10-100 MG/5ML liquid Take 5 mLs by mouth every 4 (four) hours as needed for cough.  Kirke Shaggy HEMORRHOIDAL 0.25-14-74.9 % rectal ointment Place 1 Application rectally 3 (three) times daily as needed for irritation.  . hydrALAZINE (APRESOLINE) 25 MG tablet Take 1 tablet (25 mg total) by mouth 2 (two) times daily.  . hydrOXYzine (ATARAX) 25 MG tablet TAKE 1 TABLET BY MOUTH 3 TIMES A DAY AS NEEDED FOR ITCHING.  Marland Kitchen Ipratropium-Albuterol (COMBIVENT RESPIMAT) 20-100 MCG/ACT AERS respimat Inhale 1 puff into the lungs every 6 (six) hours as needed for wheezing or shortness of breath.  . loratadine (CLARITIN) 10 MG tablet Take 10 mg by mouth daily.  Marland Kitchen losartan (COZAAR) 100 MG tablet Take 1 tablet daily  . metFORMIN (GLUCOPHAGE) 500 MG tablet Take one tablet 500mg  po in the morning and 1/2 tablet 250 mg po at supper  . Multiple Vitamins-Minerals (PRESERVISION AREDS 2) CAPS Take 1 capsule by mouth in the morning and at bedtime.  . Omega-3 Fatty Acids (FISH OIL) 1000 MG CAPS One capsule BID  . pantoprazole (PROTONIX) 20 MG tablet TAKE (1) TABLET BY MOUTH ONCE DAILY.  Marland Kitchen saccharomyces boulardii (FLORASTOR) 250 MG capsule Take 1 capsule (250 mg total) by mouth 2 (two) times daily.   . Vitamin D, Cholecalciferol, 10 MCG (400 UNIT) TABS TAKE 1 TABLET BY MOUTH ONCE DAILY.   No facility-administered encounter medications on file as of 03/07/2022.    Vitals:   03/07/22 1044  BP: 119/82  Pulse: 90  Resp: (!) 22  Temp: 97.9 F (36.6 C)  SpO2: 93%  Weight: 178 lb (80.7 kg)  Height: 5\' 8"  (1.727 m)   Body mass index is 27.06 kg/m.  PREVIOUS   02-17-22: chest x-ray: 1. Right middle lobe airspace disease which may reflect atelectasis versus pneumonia  02-22-22: chest x-ray:  1. Progressive right middle lobe atelectasis/consolidation. 2. Increasing streaky left basilar opacities with small left pleural effusion, could reflect atelectasis or developing infection.  NO NEW EXAMS   LABS  REVIEWED: PREVIOUS   11-27-21: hgb a1c 7.0; chol 188; ldl 93; trig 324; hdl 41 02-17-22: wbc 15.1; hgb 11.6; hct 34.8; mcv 86.4 plt 293; glucose 169; bun 12; creat 0.72; k+ 4.3; na++ 123; ca 8.7; gfr >60 02-18-22: wbc 13.1; hgb 9.7; hct 29.3 mcv 87.5 plt 257; glucose 135; bun 12; creat 0.70; k+ 3.9; na++ 126; ca 7.9; gfr >60; protein 5.9; albumin 2.3; mag 1.5 tsh 1.342 02-23-22: wbc 13.6; hgb 10.6; hct 32.6; mcv 87.4 plt 428; glucose 206; bun 17; creat 0.68; k+ 4.0; na++ 135; ca 9.1; gfr >60; mag 1.9   NO NEW LABS.   Review of Systems  Constitutional:  Negative for malaise/fatigue.  Respiratory:  Negative for cough and shortness of breath.   Cardiovascular:  Negative for chest pain, palpitations and leg swelling.  Gastrointestinal:  Negative for abdominal pain, constipation and heartburn.  Musculoskeletal:  Negative for back pain, joint pain and myalgias.  Skin: Negative.   Neurological:  Negative for dizziness.  Psychiatric/Behavioral:  The patient is not nervous/anxious.     Physical Exam Constitutional:      General: She is not in acute distress.    Appearance: She is well-developed. She is not diaphoretic.  Neck:     Thyroid: No thyromegaly.  Cardiovascular:     Rate and  Rhythm: Normal rate and regular rhythm.     Heart sounds: Normal heart sounds.  Pulmonary:     Effort: Pulmonary effort is normal. No respiratory distress.     Breath sounds: Normal breath sounds.  Abdominal:     General: Bowel sounds are normal. There is no distension.     Palpations: Abdomen is soft.     Tenderness: There is no abdominal tenderness.  Musculoskeletal:        General: Normal range of motion.     Cervical back: Neck supple.     Right lower leg: No edema.     Left lower leg: No edema.  Lymphadenopathy:     Cervical: No cervical adenopathy.  Skin:    General: Skin is warm and dry.  Neurological:     Mental Status: She is alert and oriented to person, place, and time.  Psychiatric:        Mood and Affect: Mood normal.      Assessment/Plan:    Patient is being discharged with the following home health services:  pt to evaluate and treat as indicated for gait balance strength   Patient is being discharged with the following durable medical equipment:  none needed   Patient has been advised to f/u with their PCP in 1-2 weeks to for a transitions of care visit.  Social services at their facility was responsible for arranging this appointment.  Pt was provided with adequate prescriptions of noncontrolled medications to reach the scheduled appointment .  For controlled substances, a limited supply was provided as appropriate for the individual patient.  If the pt normally receives these medications from a pain clinic or has a contract with another physician, these medications should be received from that clinic or physician only).    A 30 day supply of her prescription medications have been sent to RX care   Time spent with patient: 35 minutes: medications; home health dme.   Synthia Innocent NP [facility]  call [phone]   This encounter was created in error - please disregard.

## 2022-03-11 NOTE — Addendum Note (Signed)
Addended by: Gerlene Fee on: 03/11/2022 09:07 AM   Modules accepted: Orders, Level of Service

## 2022-03-11 NOTE — Progress Notes (Signed)
Location:  [facility] Room Number: 932 Place of Service:  SNF (31)  Provider: Ok Edwards np   PCP: Kathyrn Drown, MD Patient Care Team: Kathyrn Drown, MD as PCP - General (Family Medicine) Josue Hector, MD as PCP - Cardiology (Cardiology)  Extended Emergency Contact Information Primary Emergency Contact: Debruhl,Jason Address: [address]          [place], [place] [address] [place] Phone: [phone] Mobile Phone: [phone] Relation: Son  Code Status: full  Goals of care:  Advanced Directive information    02/17/2022   10:00 PM  Advanced Directives  Does Patient Have a Medical Advance Directive? No  Would patient like information on creating a medical advance directive? No - Patient declined     Allergies  Allergen Reactions   Hydrocortisone Itching   Lisinopril     Other reaction(s): Lethargy (intolerance)   Phenergan [Promethazine Hcl] Other (See Comments)    hallucinations   Statins Other (See Comments)    Severe myalgias   Trazodone And Nefazodone Cough   Prednisone Rash    Chief Complaint  Patient presents with   Discharge Note    HPI:  88 y.o. female  is being discharged to home with home health for pt. She does not need any dme. She will need her prescriptions written and will need to follow up with her medical provider. She had been hospitalized for right side pneumonia; respiratory failure with hypoxemia. She had low sodium levels; due to mild SIADH. She was admitted to this facility for acute rehab. She has participated in pt/ot to improve upon her independence with her adls. She is now ready to complete therapy on a home health basis.     Past Medical History:  Diagnosis Date   Cognitive dysfunction 03/04/2019   Patient scores 21 out of 30 on [place] cognitive assessment September 2020   Diabetes mellitus without complication ([place])    Diverticulitis    Frailty 03/04/2019   H/O bilateral breast  reduction surgery    Hyperlipidemia    a. intolerant to statins.    Hypertension     Past Surgical History:  Procedure Laterality Date   ABDOMINAL HYSTERECTOMY     APPENDECTOMY     BREAST REDUCTION SURGERY     COLON SURGERY Left    Hemicolectomy due to diverticulitis   EYE SURGERY  03/30/2009   cataract   KNEE SURGERY     LAPAROSCOPIC INCISIONAL / UMBILICAL / VENTRAL HERNIA REPAIR  02/23/2007    REDUCTION MAMMAPLASTY Bilateral 2001   REFRACTIVE SURGERY        reports that she has never smoked. She has never used smokeless tobacco. She reports that she does not drink alcohol and does not use drugs. Social History   Socioeconomic History   Marital status: Widowed    Spouse name: Not on file   Number of children: Not on file   Years of education: Not on file   Highest education level: Not on file  Occupational History   Not on file  Tobacco Use   Smoking status: Never   Smokeless tobacco: Never  Vaping Use   Vaping Use: Never used  Substance and Sexual Activity   Alcohol use: No   Drug use: No   Sexual activity: Never    Birth control/protection: Abstinence  Other Topics Concern   Not on file  Social History Narrative   Not on file   Social  Determinants of Health   Financial Resource Strain: Low Risk  (08/28/2021)   Overall Financial Resource Strain (CARDIA)    Difficulty of Paying Living Expenses: Not hard at all  Food Insecurity: No Food Insecurity (08/28/2021)   Hunger Vital Sign    Worried About Running Out of Food in the Last Year: Never true    Ran Out of Food in the Last Year: Never true  Transportation Needs: No Transportation Needs (08/28/2021)   PRAPARE - Hydrologist (Medical): No    Lack of Transportation (Non-Medical): No  Physical Activity: Sufficiently Active (08/28/2021)   Exercise Vital Sign    Days of Exercise per Week: 5 days    Minutes of Exercise per Session: 30 min  Stress: No Stress Concern Present (08/28/2021)    [place]    Feeling of Stress : Not at all  Social Connections: Moderately Integrated (08/28/2021)   Social Connection and Isolation Panel [NHANES]    Frequency of Communication with Friends and Family: More than three times a week    Frequency of Social Gatherings with Friends and Family: More than three times a week    Attends Religious Services: More than 4 times per year    Active Member of [employer] or Organizations: Yes    Attends Archivist Meetings: More than 4 times per year    Marital Status: Widowed  Intimate Partner Violence: Not At Risk (08/28/2021)   Humiliation, Afraid, Rape, and Kick questionnaire    Fear of Current or Ex-Partner: No    Emotionally Abused: No    Physically Abused: No    Sexually Abused: No   Functional Status Survey:    Allergies  Allergen Reactions   Hydrocortisone Itching   Lisinopril     Other reaction(s): Lethargy (intolerance)   Phenergan [Promethazine Hcl] Other (See Comments)    hallucinations   Statins Other (See Comments)    Severe myalgias   Trazodone And Nefazodone Cough   Prednisone Rash    Pertinent  Health Maintenance Due  Topic Date Due   INFLUENZA VACCINE  01/29/2022   OPHTHALMOLOGY EXAM  05/21/2022   HEMOGLOBIN A1C  05/30/2022   FOOT EXAM  12/14/2022   DEXA SCAN  Completed    Medications: Outpatient Encounter Medications as of 03/07/2022  Medication Sig   acetaminophen (ACETAMINOPHEN EXTRA STRENGTH) 500 MG tablet Take 1 tablet (500 mg total) by mouth every 6 (six) hours as needed.   ALPRAZolam (XANAX) 0.25 MG tablet Take 1 tablet (0.25 mg total) by mouth 2 (two) times daily.   amLODipine (NORVASC) 10 MG tablet Take 1 tablet (10 mg total) by mouth daily.   ANTI-DIARRHEAL 2 MG tablet TAKE 1 TABLET BY MOUTH 3 TIMES A DAY AS NEEDED FOR DIARRHEA.   benzonatate (TESSALON) 100 MG capsule Take 1 capsule (100 mg total) by mouth 3 (three) times daily as  needed for cough.   beta carotene w/minerals (OCUVITE) tablet TAKE 1 TABLET BY MOUTH AT BEDTIME.   Carboxymethylcellulose Sodium (THERATEARS) 0.25 % SOLN Apply 1 drop to eye in the morning and at bedtime.   Cyanocobalamin (VITAMIN B-12) 2500 MCG SUBL TAKE 1/2 TABLET (1250MCG) BY MOUTH AT BEDTIME.   dicyclomine (BENTYL) 10 MG capsule 1 bid prn abd cramps and loose stool   EASYMAX TEST test strip CHECK BLOOD SUGAR ONCE DAILY.(CALL MD IF BS BELOW 60: OR IF BS ABOVE 400)   famotidine (PEPCID) 20 MG tablet TAKE  1 TABLET BY MOUTH EACH MORNING.   gabapentin (NEURONTIN) 100 MG capsule Take 3 capsules (300 mg total) by mouth 2 (two) times daily.   GERI-TUSSIN DM 10-100 MG/5ML liquid Take 5 mLs by mouth every 4 (four) hours as needed for cough.   GOODSENSE HEMORRHOIDAL 0.25-14-74.9 % rectal ointment Place 1 Application rectally 3 (three) times daily as needed for irritation.   hydrALAZINE (APRESOLINE) 25 MG tablet Take 1 tablet (25 mg total) by mouth 2 (two) times daily.   hydrOXYzine (ATARAX) 25 MG tablet TAKE 1 TABLET BY MOUTH 3 TIMES A DAY AS NEEDED FOR ITCHING.   Ipratropium-Albuterol (COMBIVENT RESPIMAT) 20-100 MCG/ACT AERS respimat Inhale 1 puff into the lungs every 6 (six) hours as needed for wheezing or shortness of breath.   loratadine (CLARITIN) 10 MG tablet Take 10 mg by mouth daily.   losartan (COZAAR) 100 MG tablet Take 1 tablet daily   metFORMIN (GLUCOPHAGE) 500 MG tablet Take one tablet '500mg'$  po in the morning and 1/2 tablet 250 mg po at supper   Multiple Vitamins-Minerals (PRESERVISION AREDS 2) CAPS Take 1 capsule by mouth in the morning and at bedtime.   Omega-3 Fatty Acids (FISH OIL) 1000 MG CAPS One capsule BID   pantoprazole (PROTONIX) 20 MG tablet TAKE (1) TABLET BY MOUTH ONCE DAILY.   saccharomyces boulardii (FLORASTOR) 250 MG capsule Take 1 capsule (250 mg total) by mouth 2 (two) times daily.   Vitamin D, Cholecalciferol, 10 MCG (400 UNIT) TABS TAKE 1 TABLET BY MOUTH ONCE DAILY.    No facility-administered encounter medications on file as of 03/07/2022.    Vitals:   03/07/22 1044  BP: 119/82  Pulse: 90  Resp: (!) 22  Temp: 97.9 F (36.6 C)  SpO2: 93%  Weight: 178 lb (80.7 kg)  Height: '5\' 8"'$  (4.970 m)   Body mass index is 27.06 kg/m.  PREVIOUS   02-17-22: chest x-ray: 1. Right middle lobe airspace disease which may reflect atelectasis versus pneumonia  02-22-22: chest x-ray:  1. Progressive right middle lobe atelectasis/consolidation. 2. Increasing streaky left basilar opacities with small left pleural effusion, could reflect atelectasis or developing infection.  NO NEW EXAMS   LABS  REVIEWED: PREVIOUS   11-27-21: hgb a1c 7.0; chol 188; ldl 93; trig 324; hdl 41 02-17-22: wbc 15.1; hgb 11.6; hct 34.8; mcv 86.4 plt 293; glucose 169; bun 12; creat 0.72; k+ 4.3; na++ 123; ca 8.7; gfr >60 02-18-22: wbc 13.1; hgb 9.7; hct 29.3 mcv 87.5 plt 257; glucose 135; bun 12; creat 0.70; k+ 3.9; na++ 126; ca 7.9; gfr >60; protein 5.9; albumin 2.3; mag 1.5 tsh 1.342 02-23-22: wbc 13.6; hgb 10.6; hct 32.6; mcv 87.4 plt 428; glucose 206; bun 17; creat 0.68; k+ 4.0; na++ 135; ca 9.1; gfr >60; mag 1.9   NO NEW LABS.   Review of Systems  Constitutional:  Negative for malaise/fatigue.  Respiratory:  Negative for cough and shortness of breath.   Cardiovascular:  Negative for chest pain, palpitations and leg swelling.  Gastrointestinal:  Negative for abdominal pain, constipation and heartburn.  Musculoskeletal:  Negative for back pain, joint pain and myalgias.  Skin: Negative.   Neurological:  Negative for dizziness.  Psychiatric/Behavioral:  The patient is not nervous/anxious.     Physical Exam Constitutional:      General: She is not in acute distress.    Appearance: She is well-developed. She is not diaphoretic.  Neck:     Thyroid: No thyromegaly.  Cardiovascular:     Rate and  Rhythm: Normal rate and regular rhythm.     Heart sounds: Normal heart sounds.   Pulmonary:     Effort: Pulmonary effort is normal. No respiratory distress.     Breath sounds: Normal breath sounds.  Abdominal:     General: Bowel sounds are normal. There is no distension.     Palpations: Abdomen is soft.     Tenderness: There is no abdominal tenderness.  Musculoskeletal:        General: Normal range of motion.     Cervical back: Neck supple.     Right lower leg: No edema.     Left lower leg: No edema.  Lymphadenopathy:     Cervical: No cervical adenopathy.  Skin:    General: Skin is warm and dry.  Neurological:     Mental Status: She is alert and oriented to person, place, and time.  Psychiatric:        Mood and Affect: Mood normal.      Assessment/Plan:    Patient is being discharged with the following home health services:  pt to evaluate and treat as indicated for gait balance strength   Patient is being discharged with the following durable medical equipment:  none needed   Patient has been advised to f/u with their PCP in 1-2 weeks to for a transitions of care visit.  Social services at their facility was responsible for arranging this appointment.  Pt was provided with adequate prescriptions of noncontrolled medications to reach the scheduled appointment .  For controlled substances, a limited supply was provided as appropriate for the individual patient.  If the pt normally receives these medications from a pain clinic or has a contract with another physician, these medications should be received from that clinic or physician only).    A 30 day supply of her prescription medications have been sent to RX care   Time spent with patient: 35 minutes: medications; home health dme.   Ok Edwards NP [facility]  call [phone]

## 2022-03-12 ENCOUNTER — Ambulatory Visit (INDEPENDENT_AMBULATORY_CARE_PROVIDER_SITE_OTHER): Payer: Medicare Other | Admitting: Family Medicine

## 2022-03-12 ENCOUNTER — Encounter: Payer: Self-pay | Admitting: Family Medicine

## 2022-03-12 VITALS — BP 132/70 | HR 97 | Temp 97.9°F | Wt 177.0 lb

## 2022-03-12 DIAGNOSIS — D500 Iron deficiency anemia secondary to blood loss (chronic): Secondary | ICD-10-CM

## 2022-03-12 DIAGNOSIS — E785 Hyperlipidemia, unspecified: Secondary | ICD-10-CM

## 2022-03-12 DIAGNOSIS — E1169 Type 2 diabetes mellitus with other specified complication: Secondary | ICD-10-CM | POA: Diagnosis not present

## 2022-03-12 DIAGNOSIS — M6281 Muscle weakness (generalized): Secondary | ICD-10-CM | POA: Diagnosis not present

## 2022-03-12 DIAGNOSIS — Z23 Encounter for immunization: Secondary | ICD-10-CM | POA: Diagnosis not present

## 2022-03-12 DIAGNOSIS — E119 Type 2 diabetes mellitus without complications: Secondary | ICD-10-CM

## 2022-03-12 DIAGNOSIS — I10 Essential (primary) hypertension: Secondary | ICD-10-CM

## 2022-03-12 NOTE — Progress Notes (Signed)
Lab orders placed.  

## 2022-03-12 NOTE — Progress Notes (Signed)
   Subjective:    Patient ID: [name], female    DOB: June 09, 1932, 88 y.o.   MRN: [id number]  HPI Pt arrives for follow up. Pt went to ER on 02/17/22 for pneumonia. Pt was at [facility] from 02/17/22-02/23/22. Pt then went to [facility] for therapy after pneumonia. Pt had PT,OT,ST at [facility].  Hospital notes were reviewed Nursing home notes reviewed Labs reviewed Her strength is doing much better Denies any coughing wheezing or difficulty breathing Extremities no edema has good movement to her legs good strength Very happy today  Review of Systems     Objective:   Physical Exam General-in no acute distress Eyes-no discharge Lungs-respiratory rate normal, CTA CV-no murmurs,RRR Extremities skin warm dry no edema Neuro grossly normal Behavior normal, alert   We did discuss her diabetes hypertension hyperlipidemia.  Also discussed preparation for surgery staying physically active and getting her flu shot in her COVID-vaccine as well as trying to stay healthy as possible We will go ahead and order her A1c before her surgery to make sure that is under good control We will order metabolic 7 to make sure the calcium is back to normal We will order the CBC to make sure her hemoglobin is stable before surgery    Assessment & Plan:  Follow-up of pneumonia doing much better currently Diabetes under good control continue current measures Patient is approved for her surgery in approximately 4 weeks she will follow-up in approximately 6 weeks She will do lab work in approximately 10 to 14 days we will forward copy of the labs along with our note to Dr. Theda Sers

## 2022-03-12 NOTE — Addendum Note (Signed)
Addended by: Vicente Males on: 03/12/2022 04:57 PM   Modules accepted: Orders

## 2022-03-13 DIAGNOSIS — M6281 Muscle weakness (generalized): Secondary | ICD-10-CM | POA: Diagnosis not present

## 2022-03-14 DIAGNOSIS — M6281 Muscle weakness (generalized): Secondary | ICD-10-CM | POA: Diagnosis not present

## 2022-03-15 ENCOUNTER — Other Ambulatory Visit: Payer: Self-pay | Admitting: Family Medicine

## 2022-03-18 DIAGNOSIS — M6281 Muscle weakness (generalized): Secondary | ICD-10-CM | POA: Diagnosis not present

## 2022-03-19 DIAGNOSIS — M6281 Muscle weakness (generalized): Secondary | ICD-10-CM | POA: Diagnosis not present

## 2022-03-21 DIAGNOSIS — M6281 Muscle weakness (generalized): Secondary | ICD-10-CM | POA: Diagnosis not present

## 2022-03-22 NOTE — Progress Notes (Signed)
Sent message, via epic in basket, requesting orders in epic from surgeon.  

## 2022-03-25 DIAGNOSIS — M6281 Muscle weakness (generalized): Secondary | ICD-10-CM | POA: Diagnosis not present

## 2022-03-25 NOTE — Patient Instructions (Signed)
DUE TO COVID-19 ONLY TWO VISITORS  (aged 86 and older)  ARE ALLOWED TO COME WITH YOU AND STAY IN THE WAITING ROOM ONLY DURING PRE OP AND PROCEDURE.   **NO VISITORS ARE ALLOWED IN THE SHORT STAY AREA OR RECOVERY ROOM!!**  IF YOU WILL BE ADMITTED INTO THE HOSPITAL YOU ARE ALLOWED ONLY FOUR SUPPORT PEOPLE DURING VISITATION HOURS ONLY (7 AM -8PM)   The support person(s) must pass our screening, gel in and out, and wear a mask at all times, including in the patient's room. Patients must also wear a mask when staff or their support person are in the room. Visitors GUEST BADGE MUST BE WORN VISIBLY  One adult visitor may remain with you overnight and MUST be in the room by 8 P.M.     Your procedure is scheduled on: 04/10/22   Report to [facility] Main Entrance    Report to admitting at  6:00 AM   Call this number if you have problems the morning of surgery [phone]   Do not eat food  or drink:After Midnight.            If you have questions, please contact your surgeon's office.       Oral Hygiene is also important to reduce your risk of infection.                                    Remember - BRUSH YOUR TEETH THE MORNING OF SURGERY WITH YOUR REGULAR TOOTHPASTE   Do NOT smoke after Midnight   Take these medicines the morning of surgery with A SIP OF WATER: Xanax if needed                                                                                                                             Use inhaler and bring with you                                                                                                                             Gabapentin  Hydralazine                                                                                                                              [place]                                                                                                                               Amlodipine                                                                                                                               Pantoprazole   DO NOT TAKE ANY ORAL DIABETIC MEDICATIONS DAY OF YOUR SURGERY( Metformin)  Bring CPAP mask and tubing day of surgery.                              You may not have any metal on your body including hair pins, jewelry, and body piercing             Do not wear make-up, lotions, powders, perfumes/cologne, or deodorant  Do not wear nail polish including gel and S&S, artificial/acrylic nails, or any other type of covering on natural nails including finger and toenails. If you have artificial nails, gel coating, etc. that needs to be removed by a nail salon please have this removed prior to surgery or surgery may need to be canceled/ delayed if the surgeon/ anesthesia feels like they are unable to be safely monitored.   Do not shave  48 hours prior to surgery.               Men may shave face and neck.   Do not bring valuables to the hospital. [place]  VALUABLES.   Contacts, dentures or bridgework may not be worn into surgery.   Bring small overnight bag day of surgery.   DO NOT [place]. PHARMACY WILL DISPENSE MEDICATIONS LISTED ON YOUR MEDICATION LIST TO YOU DURING YOUR ADMISSION [place]!       Special Instructions: Bring a copy of your healthcare power of attorney and living will documents  the day of surgery if you haven't scanned them before.              Please read over the following fact sheets you were given: IF YOU HAVE QUESTIONS ABOUT YOUR PRE-OP INSTRUCTIONS PLEASE CALL [phone]     [facility] - Preparing for Surgery Before surgery, you can play an important role.  Because skin is not sterile, your skin needs to be as free of germs as possible.  You can reduce the  number of germs on your skin by washing with CHG (chlorahexidine gluconate) soap before surgery.  CHG is an antiseptic cleaner which kills germs and bonds with the skin to continue killing germs even after washing. Please DO NOT use if you have an allergy to CHG or antibacterial soaps.  If your skin becomes reddened/irritated stop using the CHG and inform your nurse when you arrive at Short Stay. Do not shave (including legs and underarms) for at least 48 hours prior to the first CHG shower.   Please follow these instructions carefully:  1.  Shower with CHG Soap the night before surgery and the  morning of Surgery.  2.  If you choose to wash your hair, wash your hair first as usual with your  normal  shampoo.  3.  After you shampoo, rinse your hair and body thoroughly to remove the  shampoo.                            4.  Use CHG as you would any other liquid soap.  You can apply chg directly  to the skin and wash                       Gently with a scrungie or clean washcloth.  5.  Apply the CHG Soap to your body ONLY FROM THE NECK DOWN.   Do not use on face/ open                           Wound or open sores. Avoid contact with eyes, ears mouth and genitals (private parts).                       Wash face,  Genitals (private parts) with your normal soap.             6.  Wash thoroughly, paying special attention to the area where your surgery  will be performed.  7.  Thoroughly rinse your body with warm water from the neck down.  8.  DO NOT shower/wash with your normal soap after using and rinsing off  the CHG Soap.                9.  Pat yourself dry with a clean towel.            10.  Wear clean pajamas.            11.  Place clean sheets  on your bed the night of your first shower and do not  sleep with pets. Day of Surgery : Do not apply any lotions/deodorants the morning of surgery.  Please wear clean clothes to the hospital/surgery center.  FAILURE TO FOLLOW THESE INSTRUCTIONS MAY RESULT  IN THE CANCELLATION OF YOUR SURGERY    ________________________________________________________________________   Incentive Spirometer  An incentive spirometer is a tool that can help keep your lungs clear and active. This tool measures how well you are filling your lungs with each breath. Taking long deep breaths may help reverse or decrease the chance of developing breathing (pulmonary) problems (especially infection) following: A long period of time when you are unable to move or be active. BEFORE THE PROCEDURE  If the spirometer includes an indicator to show your best effort, your nurse or respiratory therapist will set it to a desired goal. If possible, sit up straight or lean slightly forward. Try not to slouch. Hold the incentive spirometer in an upright position. INSTRUCTIONS FOR USE  Sit on the edge of your bed if possible, or sit up as far as you can in bed or on a chair. Hold the incentive spirometer in an upright position. Breathe out normally. Place the mouthpiece in your mouth and seal your lips tightly around it. Breathe in slowly and as deeply as possible, raising the piston or the ball toward the top of the column. Hold your breath for 3-5 seconds or for as long as possible. Allow the piston or ball to fall to the bottom of the column. Remove the mouthpiece from your mouth and breathe out normally. Rest for a few seconds and repeat Steps 1 through 7 at least 10 times every 1-2 hours when you are awake. Take your time and take a few normal breaths between deep breaths. The spirometer may include an indicator to show your best effort. Use the indicator as a goal to work toward during each repetition. After each set of 10 deep breaths, practice coughing to be sure your lungs are clear. If you have an incision (the cut made at the time of surgery), support your incision when coughing by placing a pillow or rolled up towels firmly against it. Once you are able to get out of bed,  walk around indoors and cough well. You may stop using the incentive spirometer when instructed by your caregiver.  RISKS AND COMPLICATIONS Take your time so you do not get dizzy or light-headed. If you are in pain, you may need to take or ask for pain medication before doing incentive spirometry. It is harder to take a deep breath if you are having pain. AFTER USE Rest and breathe slowly and easily. It can be helpful to keep track of a log of your progress. Your caregiver can provide you with a simple table to help with this. If you are using the spirometer at home, follow these instructions: [place] IF:  You are having difficultly using the spirometer. You have trouble using the spirometer as often as instructed. Your pain medication is not giving enough relief while using the spirometer. You develop fever of 100.5 F (38.1 C) or higher. SEEK IMMEDIATE MEDICAL CARE IF:  You cough up bloody sputum that had not been present before. You develop fever of 102 F (38.9 C) or greater. You develop worsening pain at or near the incision site. MAKE SURE YOU:  Understand these instructions. Will watch your condition. Will get help right away if you are not  doing well or get worse. Document Released: 10/28/2006 Document Revised: 09/09/2011 Document Reviewed: 12/29/2006 [facility] Patient Information 2014 [place], [place].   ________________________________________________________________________

## 2022-03-26 ENCOUNTER — Telehealth: Payer: Self-pay

## 2022-03-26 DIAGNOSIS — E785 Hyperlipidemia, unspecified: Secondary | ICD-10-CM | POA: Diagnosis not present

## 2022-03-26 DIAGNOSIS — I10 Essential (primary) hypertension: Secondary | ICD-10-CM | POA: Diagnosis not present

## 2022-03-26 DIAGNOSIS — D500 Iron deficiency anemia secondary to blood loss (chronic): Secondary | ICD-10-CM | POA: Diagnosis not present

## 2022-03-26 DIAGNOSIS — M6281 Muscle weakness (generalized): Secondary | ICD-10-CM | POA: Diagnosis not present

## 2022-03-26 DIAGNOSIS — E1169 Type 2 diabetes mellitus with other specified complication: Secondary | ICD-10-CM | POA: Diagnosis not present

## 2022-03-26 DIAGNOSIS — E119 Type 2 diabetes mellitus without complications: Secondary | ICD-10-CM | POA: Diagnosis not present

## 2022-03-26 NOTE — Telephone Encounter (Signed)
Please advise. Thank you

## 2022-03-26 NOTE — Telephone Encounter (Signed)
Caller name:Whitni Ono   On DPR? :Yes  Call back number:[phone] Eyvonne Mechanic)  Provider they see: Wolfgang Phoenix   Reason for call:Trinidad Pasquarella called this morning and said that the prostate that the nursing home has her on is causing her sugar to go up and she is wanting to know if Dr Nicki Reaper wants to keep her on this? She is scheduled to has knee surgery next month.

## 2022-03-26 NOTE — Telephone Encounter (Signed)
So I think there is a communication issue going on-this does not seem to be due to prostate?  Please clarify with patient.  If it is about pantoprazole that would be very unlikely to be causing her sugar to go up.  It would be helpful to know what her sugars are going.  Perhaps we need to look closer or look at other issues Please try to clarify message with patient thank you

## 2022-03-27 LAB — CBC WITH DIFFERENTIAL/PLATELET
Basophils Absolute: 0 10*3/uL (ref 0.0–0.2)
Basos: 0 %
EOS (ABSOLUTE): 0.3 10*3/uL (ref 0.0–0.4)
Eos: 3 %
Hematocrit: 33.7 % — ABNORMAL LOW (ref 34.0–46.6)
Hemoglobin: 11.2 g/dL (ref 11.1–15.9)
Immature Grans (Abs): 0 10*3/uL (ref 0.0–0.1)
Immature Granulocytes: 0 %
Lymphocytes Absolute: 1.9 10*3/uL (ref 0.7–3.1)
Lymphs: 23 %
MCH: 28.3 pg (ref 26.6–33.0)
MCHC: 33.2 g/dL (ref 31.5–35.7)
MCV: 85 fL (ref 79–97)
Monocytes Absolute: 0.8 10*3/uL (ref 0.1–0.9)
Monocytes: 10 %
Neutrophils Absolute: 5.2 10*3/uL (ref 1.4–7.0)
Neutrophils: 64 %
Platelets: 291 10*3/uL (ref 150–450)
RBC: 3.96 x10E6/uL (ref 3.77–5.28)
RDW: 13.8 % (ref 11.7–15.4)
WBC: 8.2 10*3/uL (ref 3.4–10.8)

## 2022-03-27 LAB — BASIC METABOLIC PANEL
BUN/Creatinine Ratio: 23 (ref 12–28)
BUN: 22 mg/dL (ref 8–27)
CO2: 23 mmol/L (ref 20–29)
Calcium: 9.4 mg/dL (ref 8.7–10.3)
Chloride: 98 mmol/L (ref 96–106)
Creatinine, Ser: 0.95 mg/dL (ref 0.57–1.00)
Glucose: 149 mg/dL — ABNORMAL HIGH (ref 70–99)
Potassium: 4.9 mmol/L (ref 3.5–5.2)
Sodium: 139 mmol/L (ref 134–144)
eGFR: 58 mL/min/{1.73_m2} — ABNORMAL LOW (ref >59)

## 2022-03-27 LAB — HEMOGLOBIN A1C
Est. average glucose Bld gHb Est-mCnc: 154 mg/dL
Hgb A1c MFr Bld: 7 % — ABNORMAL HIGH (ref 4.8–5.6)

## 2022-03-27 LAB — FERRITIN: Ferritin: 35 ng/mL (ref 15–150)

## 2022-03-27 NOTE — Telephone Encounter (Signed)
Returned call to [place] w/ patient's facility and informed , she will return call to schedule appt prior to surgery. Prostat d/c order to be faxed to [phone].

## 2022-03-27 NOTE — Telephone Encounter (Signed)
This was signed thank you 

## 2022-03-27 NOTE — Telephone Encounter (Signed)
At this point she can stop the protein supplement and just try to eat healthy It would be wise for [place] to schedule her an office visit preferably about a week before her surgery so we can make sure everything seems to be in line as best as possible.  Please schedule her with me thank you

## 2022-03-27 NOTE — Telephone Encounter (Signed)
I apologize, I didn't read the message clearly. Pt is taking ProStat (thickened protein liquid). Please advise. Thank you

## 2022-03-28 ENCOUNTER — Encounter: Payer: Self-pay | Admitting: Family Medicine

## 2022-03-28 DIAGNOSIS — M6281 Muscle weakness (generalized): Secondary | ICD-10-CM | POA: Diagnosis not present

## 2022-03-28 NOTE — Progress Notes (Signed)
Please mail to patient she stays at [facility]

## 2022-03-29 ENCOUNTER — Encounter (HOSPITAL_COMMUNITY): Payer: Self-pay

## 2022-03-29 ENCOUNTER — Other Ambulatory Visit: Payer: Self-pay

## 2022-03-29 ENCOUNTER — Encounter (HOSPITAL_COMMUNITY)
Admission: RE | Admit: 2022-03-29 | Discharge: 2022-03-29 | Disposition: A | Discharge status: Home / Self Care | Payer: Medicare Other | Source: Ambulatory Visit | Attending: Specialist | Admitting: Specialist

## 2022-03-29 DIAGNOSIS — I700 Atherosclerosis of aorta: Secondary | ICD-10-CM | POA: Insufficient documentation

## 2022-03-29 DIAGNOSIS — E1169 Type 2 diabetes mellitus with other specified complication: Secondary | ICD-10-CM | POA: Diagnosis not present

## 2022-03-29 DIAGNOSIS — I10 Essential (primary) hypertension: Secondary | ICD-10-CM | POA: Diagnosis not present

## 2022-03-29 DIAGNOSIS — M1711 Unilateral primary osteoarthritis, right knee: Secondary | ICD-10-CM | POA: Insufficient documentation

## 2022-03-29 DIAGNOSIS — E1151 Type 2 diabetes mellitus with diabetic peripheral angiopathy without gangrene: Secondary | ICD-10-CM | POA: Diagnosis not present

## 2022-03-29 DIAGNOSIS — Z01818 Encounter for other preprocedural examination: Secondary | ICD-10-CM

## 2022-03-29 DIAGNOSIS — E785 Hyperlipidemia, unspecified: Secondary | ICD-10-CM | POA: Insufficient documentation

## 2022-03-29 DIAGNOSIS — D500 Iron deficiency anemia secondary to blood loss (chronic): Secondary | ICD-10-CM | POA: Insufficient documentation

## 2022-03-29 DIAGNOSIS — Z01812 Encounter for preprocedural laboratory examination: Secondary | ICD-10-CM | POA: Diagnosis not present

## 2022-03-29 LAB — COMPREHENSIVE METABOLIC PANEL
ALT: 18 U/L (ref 0–44)
AST: 17 U/L (ref 15–41)
Albumin: 3.7 g/dL (ref 3.5–5.0)
Alkaline Phosphatase: 104 U/L (ref 38–126)
Anion gap: 8 (ref 5–15)
BUN: 15 mg/dL (ref 8–23)
CO2: 28 mmol/L (ref 22–32)
Calcium: 9.4 mg/dL (ref 8.9–10.3)
Chloride: 101 mmol/L (ref 98–111)
Creatinine, Ser: 0.85 mg/dL (ref 0.44–1.00)
GFR, Estimated: >60 mL/min (ref >60)
Glucose, Bld: 146 mg/dL — ABNORMAL HIGH (ref 70–99)
Potassium: 4.3 mmol/L (ref 3.5–5.1)
Sodium: 137 mmol/L (ref 135–145)
Total Bilirubin: 0.6 mg/dL (ref 0.3–1.2)
Total Protein: 6.7 g/dL (ref 6.5–8.1)

## 2022-03-29 LAB — CBC
HCT: 35.9 % — ABNORMAL LOW (ref 36.0–46.0)
Hemoglobin: 11.2 g/dL — ABNORMAL LOW (ref 12.0–15.0)
MCH: 28 pg (ref 26.0–34.0)
MCHC: 31.2 g/dL (ref 30.0–36.0)
MCV: 89.8 fL (ref 80.0–100.0)
Platelets: 287 10*3/uL (ref 150–400)
RBC: 4 MIL/uL (ref 3.87–5.11)
RDW: 14.6 % (ref 11.5–15.5)
WBC: 8.1 10*3/uL (ref 4.0–10.5)
nRBC: 0 % (ref 0.0–0.2)

## 2022-03-29 LAB — HEMOGLOBIN A1C
Hgb A1c MFr Bld: 6.7 % — ABNORMAL HIGH (ref 4.8–5.6)
Mean Plasma Glucose: 145.59 mg/dL

## 2022-03-29 LAB — SURGICAL PCR SCREEN
MRSA, PCR: NEGATIVE
Staphylococcus aureus: NEGATIVE

## 2022-03-29 LAB — TYPE AND SCREEN
ABO/RH(D): O POS
Antibody Screen: NEGATIVE

## 2022-03-29 NOTE — H&P (View-Only) (Signed)
Anesthesia note:  Bowel prep reminder:NA  PCP - Dr. Lance Sell Cardiologist -Dr. Edmonia James Other-   Chest x-ray - 02/22/22-epic EKG - 01/10/22-epic Stress Test - no ECHO - 01/23/22-epic Cardiac Cath - no  Pacemaker/ICD device last checked:NA  Sleep Study - no CPAP -   Fasting Blood Sugar - 138-150 Checks Blood Sugar ___QD__  Blood Thinner:none Blood Thinner Instructions: Aspirin Instructions: Last Dose:  Anesthesia review: yes  Patient denies shortness of breath, fever, cough and chest pain at PAT appointment Pt has no SOB with walking or ADLs.She lives at [facility] assisted living in [place], [place]. Her Son is POA but Pt reports that she signs her own legal papers.Pt is alert oriented and appropriate at the PAT visit. She denies wanting "Blood refusal". She would except blood if needed and is not Jehovah witness.  Patient verbalized understanding of instructions that were given to them at the PAT appointment. Patient was also instructed that they will need to review over the PAT instructions again at home before surgery. The assisted living provides an aid for all Dr's appointments. Instructions were given to the Pt and the aid.

## 2022-03-29 NOTE — Progress Notes (Signed)
Anesthesia note:  Bowel prep reminder:NA  PCP - Dr. Lance Sell Cardiologist -Dr. Edmonia James Other-   Chest x-ray - 02/22/22-epic EKG - 01/10/22-epic Stress Test - no ECHO - 01/23/22-epic Cardiac Cath - no  Pacemaker/ICD device last checked:NA  Sleep Study - no CPAP -   Fasting Blood Sugar - 138-150 Checks Blood Sugar ___QD__  Blood Thinner:none Blood Thinner Instructions: Aspirin Instructions: Last Dose:  Anesthesia review: yes  Patient denies shortness of breath, fever, cough and chest pain at PAT appointment Pt has no SOB with walking or ADLs.She lives at [facility] assisted living in [place], [place]. Her Son is POA but Pt reports that she signs her own legal papers.Pt is alert oriented and appropriate at the PAT visit. She denies wanting "Blood refusal". She would except blood if needed and is not Jehovah witness.  Patient verbalized understanding of instructions that were given to them at the PAT appointment. Patient was also instructed that they will need to review over the PAT instructions again at home before surgery. The assisted living provides an aid for all Dr's appointments. Instructions were given to the Pt and the aid.

## 2022-04-01 ENCOUNTER — Other Ambulatory Visit: Payer: Self-pay | Admitting: Family Medicine

## 2022-04-01 ENCOUNTER — Telehealth: Payer: Self-pay

## 2022-04-01 DIAGNOSIS — M6281 Muscle weakness (generalized): Secondary | ICD-10-CM | POA: Diagnosis not present

## 2022-04-01 NOTE — Telephone Encounter (Signed)
Caller name:Hannahgrace Dils   On DPR? :Yes  Call back number:[phone]  Provider they see: [place]    Reason for call:Pt called she is wanting a copy of her blood work sent to [facility] number is [phone]

## 2022-04-01 NOTE — Telephone Encounter (Signed)
Contacted [place] to inquire about which labs pt is needing. Sunday Spillers instructed to send all recent labs if possible. Faxed most recent labs from hospital pre screen and recent labs ordered from our office as well as letter that [place] had typed up. Faxed to [facility] [phone]

## 2022-04-02 ENCOUNTER — Telehealth: Payer: Self-pay | Admitting: Family Medicine

## 2022-04-02 DIAGNOSIS — M6281 Muscle weakness (generalized): Secondary | ICD-10-CM | POA: Diagnosis not present

## 2022-04-02 NOTE — Telephone Encounter (Signed)
[facility] verified that the medication is taken twice a day. Refills called into pharmacy per Dr Nicki Reaper

## 2022-04-02 NOTE — Telephone Encounter (Signed)
Please verify with [facility] if this is how it is taken 1 twice daily that would be fine May have 6 months  If the instructions on prescription guidelines is just once per day I would rather be on a once a day medicine than twice a day if possible

## 2022-04-02 NOTE — Telephone Encounter (Signed)
[facility] requesting refill on Preservision Capsules (take one capsule po in the morning and at bedtime). On current med list but has not been filled by office before. Please advise. Thank you

## 2022-04-03 ENCOUNTER — Ambulatory Visit (INDEPENDENT_AMBULATORY_CARE_PROVIDER_SITE_OTHER): Payer: Medicare Other | Admitting: Family Medicine

## 2022-04-03 VITALS — BP 140/88 | HR 85 | Wt 180.0 lb

## 2022-04-03 DIAGNOSIS — I10 Essential (primary) hypertension: Secondary | ICD-10-CM | POA: Diagnosis not present

## 2022-04-03 DIAGNOSIS — E119 Type 2 diabetes mellitus without complications: Secondary | ICD-10-CM

## 2022-04-03 NOTE — Progress Notes (Signed)
   Subjective:    Patient ID: [name], female    DOB: 12-15-1931, 88 y.o.   MRN: [id number]  HPI    Review of Systems     Objective:   Physical Exam        Assessment & Plan:

## 2022-04-03 NOTE — Progress Notes (Signed)
   Subjective:    Patient ID: [name], female    DOB: Feb 07, 1932, 88 y.o.   MRN: [id number]  HPI  Patient presents today for follow-up she has surgery coming up She states a little bit stressed about this but otherwise feeling good Her recent lab work overall looks reasonably well with A1c under good control Hemoglobin slightly low but within reason for her age We counseled her to stay away from all aspirin and NSAID products and fish oil over the next 7 days also not to take her vitamins. She was encouraged to continue to try to eat healthy.  Review of Systems     Objective:   Physical Exam Lungs are clear hearts regular pulse normal extremities no edema  Results for orders placed or performed during the hospital encounter of 03/29/22  Surgical pcr screen   Specimen: Nasal Mucosa; Nasal Swab  Result Value Ref Range   MRSA, PCR NEGATIVE NEGATIVE   Staphylococcus aureus NEGATIVE NEGATIVE  CBC  Result Value Ref Range   WBC 8.1 4.0 - 10.5 K/uL   RBC 4.00 3.87 - 5.11 MIL/uL   Hemoglobin 11.2 (L) 12.0 - 15.0 g/dL   HCT 35.9 (L) 36.0 - 46.0 %   MCV 89.8 80.0 - 100.0 fL   MCH 28.0 26.0 - 34.0 pg   MCHC 31.2 30.0 - 36.0 g/dL   RDW 14.6 11.5 - 15.5 %   Platelets 287 150 - 400 K/uL   nRBC 0.0 0.0 - 0.2 %  Comprehensive metabolic panel  Result Value Ref Range   Sodium 137 135 - 145 mmol/L   Potassium 4.3 3.5 - 5.1 mmol/L   Chloride 101 98 - 111 mmol/L   CO2 28 22 - 32 mmol/L   Glucose, Bld 146 (H) 70 - 99 mg/dL   BUN 15 8 - 23 mg/dL   Creatinine, Ser 0.85 0.44 - 1.00 mg/dL   Calcium 9.4 8.9 - 10.3 mg/dL   Total Protein 6.7 6.5 - 8.1 g/dL   Albumin 3.7 3.5 - 5.0 g/dL   AST 17 15 - 41 U/L   ALT 18 0 - 44 U/L   Alkaline Phosphatase 104 38 - 126 U/L   Total Bilirubin 0.6 0.3 - 1.2 mg/dL   GFR, Estimated >60 >60 mL/min   Anion gap 8 5 - 15  Hemoglobin A1c per protocol  Result Value Ref Range   Hgb A1c MFr Bld 6.7 (H) 4.8 - 5.6 %   Mean Plasma Glucose 145.59 mg/dL   Type and screen Order type and screen if day of surgery is less than 15 days from draw of preadmission visit or order morning of surgery if day of surgery is greater than 6 days from preadmission visit.  Result Value Ref Range   ABO/RH(D) O POS    Antibody Screen NEG    Sample Expiration 04/12/2022,2359    Extend sample reason      NO TRANSFUSIONS OR PREGNANCY IN THE PAST 3 MONTHS Performed at [facility], [place] [address], [place], [place] [address]           Assessment & Plan:  Slight anemia more than likely anemia of chronic disease we will watch for any trends  Diabetes under decent control continue current measures  Blood pressure is within criteria for her age continue medications  Patient will have her surgery then go to the [facility] for rehab

## 2022-04-08 ENCOUNTER — Ambulatory Visit: Payer: Medicare Other | Admitting: Family Medicine

## 2022-04-09 NOTE — Anesthesia Preprocedure Evaluation (Signed)
Anesthesia Evaluation  Patient identified by MRN, date of birth, ID band Patient awake    Reviewed: Allergy & Precautions, NPO status , Patient's Chart, lab work & pertinent test results  Airway Mallampati: II       Dental   Pulmonary pneumonia,    breath sounds clear to auscultation       Cardiovascular hypertension,  Rhythm:Regular Rate:Normal     Neuro/Psych PSYCHIATRIC DISORDERS  Neuromuscular disease    GI/Hepatic Neg liver ROS, GERD  ,  Endo/Other  diabetes  Renal/GU negative Renal ROS     Musculoskeletal  (+) Arthritis ,   Abdominal   Peds  Hematology   Anesthesia Other Findings   Reproductive/Obstetrics                            Anesthesia Physical Anesthesia Plan  ASA: 3  Anesthesia Plan: Spinal   Post-op Pain Management: Regional block*   Induction:   PONV Risk Score and Plan: Ondansetron, Treatment may vary due to age or medical condition and Dexamethasone  Airway Management Planned: Nasal Cannula and Simple Face Mask  Additional Equipment:   Intra-op Plan:   Post-operative Plan:   Informed Consent: I have reviewed the patients History and Physical, chart, labs and discussed the procedure including the risks, benefits and alternatives for the proposed anesthesia with the patient or authorized representative who has indicated his/her understanding and acceptance.     Dental advisory given  Plan Discussed with: Anesthesiologist and CRNA  Anesthesia Plan Comments:      Anesthesia Quick Evaluation

## 2022-04-10 ENCOUNTER — Ambulatory Visit (HOSPITAL_COMMUNITY): Payer: Medicare Other | Admitting: Physician Assistant

## 2022-04-10 ENCOUNTER — Other Ambulatory Visit: Payer: Self-pay

## 2022-04-10 ENCOUNTER — Inpatient Hospital Stay (HOSPITAL_COMMUNITY)
Admission: RE | Admit: 2022-04-10 | Discharge: 2022-04-15 | DRG: 470 | Disposition: A | Discharge status: Skilled Nursing Facility | Payer: Medicare Other | Source: Ambulatory Visit | Attending: Specialist | Admitting: Specialist

## 2022-04-10 ENCOUNTER — Encounter (HOSPITAL_COMMUNITY)
Admission: RE | Disposition: A | Discharge status: Skilled Nursing Facility | Payer: Self-pay | Source: Ambulatory Visit | Attending: Specialist

## 2022-04-10 ENCOUNTER — Encounter (HOSPITAL_COMMUNITY): Payer: Self-pay | Admitting: Specialist

## 2022-04-10 DIAGNOSIS — Z888 Allergy status to other drugs, medicaments and biological substances status: Secondary | ICD-10-CM

## 2022-04-10 DIAGNOSIS — Z96651 Presence of right artificial knee joint: Secondary | ICD-10-CM | POA: Diagnosis not present

## 2022-04-10 DIAGNOSIS — E1169 Type 2 diabetes mellitus with other specified complication: Secondary | ICD-10-CM | POA: Diagnosis present

## 2022-04-10 DIAGNOSIS — Z79899 Other long term (current) drug therapy: Secondary | ICD-10-CM | POA: Diagnosis not present

## 2022-04-10 DIAGNOSIS — M6281 Muscle weakness (generalized): Secondary | ICD-10-CM | POA: Diagnosis not present

## 2022-04-10 DIAGNOSIS — Z01818 Encounter for other preprocedural examination: Secondary | ICD-10-CM

## 2022-04-10 DIAGNOSIS — I700 Atherosclerosis of aorta: Secondary | ICD-10-CM | POA: Diagnosis present

## 2022-04-10 DIAGNOSIS — Z471 Aftercare following joint replacement surgery: Secondary | ICD-10-CM | POA: Diagnosis not present

## 2022-04-10 DIAGNOSIS — F411 Generalized anxiety disorder: Secondary | ICD-10-CM | POA: Diagnosis present

## 2022-04-10 DIAGNOSIS — G709 Myoneural disorder, unspecified: Secondary | ICD-10-CM

## 2022-04-10 DIAGNOSIS — Z1152 Encounter for screening for COVID-19: Secondary | ICD-10-CM | POA: Diagnosis not present

## 2022-04-10 DIAGNOSIS — K5900 Constipation, unspecified: Secondary | ICD-10-CM | POA: Diagnosis present

## 2022-04-10 DIAGNOSIS — R262 Difficulty in walking, not elsewhere classified: Secondary | ICD-10-CM | POA: Diagnosis not present

## 2022-04-10 DIAGNOSIS — E119 Type 2 diabetes mellitus without complications: Secondary | ICD-10-CM

## 2022-04-10 DIAGNOSIS — R41841 Cognitive communication deficit: Secondary | ICD-10-CM | POA: Diagnosis not present

## 2022-04-10 DIAGNOSIS — Z7984 Long term (current) use of oral hypoglycemic drugs: Secondary | ICD-10-CM

## 2022-04-10 DIAGNOSIS — G4700 Insomnia, unspecified: Secondary | ICD-10-CM | POA: Diagnosis present

## 2022-04-10 DIAGNOSIS — Z8249 Family history of ischemic heart disease and other diseases of the circulatory system: Secondary | ICD-10-CM

## 2022-04-10 DIAGNOSIS — E785 Hyperlipidemia, unspecified: Secondary | ICD-10-CM | POA: Diagnosis present

## 2022-04-10 DIAGNOSIS — F0390 Unspecified dementia without behavioral disturbance: Secondary | ICD-10-CM | POA: Diagnosis not present

## 2022-04-10 DIAGNOSIS — E1142 Type 2 diabetes mellitus with diabetic polyneuropathy: Secondary | ICD-10-CM | POA: Diagnosis present

## 2022-04-10 DIAGNOSIS — Z9049 Acquired absence of other specified parts of digestive tract: Secondary | ICD-10-CM | POA: Diagnosis not present

## 2022-04-10 DIAGNOSIS — I152 Hypertension secondary to endocrine disorders: Secondary | ICD-10-CM | POA: Diagnosis present

## 2022-04-10 DIAGNOSIS — E1149 Type 2 diabetes mellitus with other diabetic neurological complication: Secondary | ICD-10-CM | POA: Diagnosis not present

## 2022-04-10 DIAGNOSIS — I10 Essential (primary) hypertension: Secondary | ICD-10-CM

## 2022-04-10 DIAGNOSIS — Z833 Family history of diabetes mellitus: Secondary | ICD-10-CM

## 2022-04-10 DIAGNOSIS — M1711 Unilateral primary osteoarthritis, right knee: Secondary | ICD-10-CM | POA: Diagnosis present

## 2022-04-10 DIAGNOSIS — K219 Gastro-esophageal reflux disease without esophagitis: Secondary | ICD-10-CM | POA: Diagnosis present

## 2022-04-10 DIAGNOSIS — K649 Unspecified hemorrhoids: Secondary | ICD-10-CM | POA: Diagnosis not present

## 2022-04-10 DIAGNOSIS — E1159 Type 2 diabetes mellitus with other circulatory complications: Secondary | ICD-10-CM | POA: Diagnosis not present

## 2022-04-10 DIAGNOSIS — Z751 Person awaiting admission to adequate facility elsewhere: Secondary | ICD-10-CM | POA: Diagnosis not present

## 2022-04-10 DIAGNOSIS — G8918 Other acute postprocedural pain: Secondary | ICD-10-CM | POA: Diagnosis not present

## 2022-04-10 DIAGNOSIS — E1143 Type 2 diabetes mellitus with diabetic autonomic (poly)neuropathy: Secondary | ICD-10-CM | POA: Diagnosis not present

## 2022-04-10 DIAGNOSIS — M159 Polyosteoarthritis, unspecified: Secondary | ICD-10-CM | POA: Diagnosis not present

## 2022-04-10 DIAGNOSIS — Z741 Need for assistance with personal care: Secondary | ICD-10-CM | POA: Diagnosis not present

## 2022-04-10 DIAGNOSIS — E1151 Type 2 diabetes mellitus with diabetic peripheral angiopathy without gangrene: Principal | ICD-10-CM

## 2022-04-10 HISTORY — PX: TOTAL KNEE ARTHROPLASTY: SHX125

## 2022-04-10 LAB — CBC
HCT: 36.3 % (ref 36.0–46.0)
Hemoglobin: 11.2 g/dL — ABNORMAL LOW (ref 12.0–15.0)
MCH: 27.5 pg (ref 26.0–34.0)
MCHC: 30.9 g/dL (ref 30.0–36.0)
MCV: 89 fL (ref 80.0–100.0)
Platelets: 266 10*3/uL (ref 150–400)
RBC: 4.08 MIL/uL (ref 3.87–5.11)
RDW: 14.6 % (ref 11.5–15.5)
WBC: 8.4 10*3/uL (ref 4.0–10.5)
nRBC: 0 % (ref 0.0–0.2)

## 2022-04-10 LAB — GLUCOSE, CAPILLARY
Glucose-Capillary: 151 mg/dL — ABNORMAL HIGH (ref 70–99)
Glucose-Capillary: 161 mg/dL — ABNORMAL HIGH (ref 70–99)

## 2022-04-10 LAB — CREATININE, SERUM
Creatinine, Ser: 0.92 mg/dL (ref 0.44–1.00)
GFR, Estimated: 60 mL/min — ABNORMAL LOW

## 2022-04-10 SURGERY — ARTHROPLASTY, KNEE, TOTAL
Anesthesia: Spinal | Site: Knee | Laterality: Right

## 2022-04-10 MED ORDER — LIDOCAINE 2% (20 MG/ML) 5 ML SYRINGE
INTRAMUSCULAR | Status: DC | PRN
  Administered 2022-04-10: 40 mg via INTRAVENOUS

## 2022-04-10 MED ORDER — HYDRALAZINE HCL 25 MG PO TABS
25.0000 mg | ORAL_TABLET | Freq: Two times a day (BID) | ORAL | Status: DC
  Administered 2022-04-10 – 2022-04-15 (×8): 25 mg via ORAL
  Filled 2022-04-10 (×9): qty 1

## 2022-04-10 MED ORDER — FENTANYL CITRATE PF 50 MCG/ML IJ SOSY
PREFILLED_SYRINGE | INTRAMUSCULAR | Status: AC
  Administered 2022-04-10: 25 ug via INTRAVENOUS
  Filled 2022-04-10: qty 1

## 2022-04-10 MED ORDER — ORAL CARE MOUTH RINSE: 15.0000 mL | OROMUCOSAL | Status: DC | PRN

## 2022-04-10 MED ORDER — FENTANYL CITRATE PF 50 MCG/ML IJ SOSY
PREFILLED_SYRINGE | INTRAMUSCULAR | Status: AC
  Administered 2022-04-10: 25 ug via INTRAVENOUS
  Filled 2022-04-10: qty 2

## 2022-04-10 MED ORDER — HYDROCODONE-ACETAMINOPHEN 5-325 MG PO TABS
1.0000 | ORAL_TABLET | ORAL | Status: DC | PRN
  Administered 2022-04-10: 1 via ORAL
  Administered 2022-04-10 – 2022-04-13 (×5): 2 via ORAL
  Administered 2022-04-13: 1 via ORAL
  Administered 2022-04-14 (×2): 2 via ORAL
  Filled 2022-04-10 (×3): qty 2
  Filled 2022-04-10: qty 1
  Filled 2022-04-10 (×5): qty 2

## 2022-04-10 MED ORDER — ONDANSETRON HCL 4 MG/2ML IJ SOLN
INTRAMUSCULAR | Status: DC | PRN
  Administered 2022-04-10: 4 mg via INTRAVENOUS

## 2022-04-10 MED ORDER — METFORMIN HCL 500 MG PO TABS
500.0000 mg | ORAL_TABLET | Freq: Every day | ORAL | Status: DC
  Administered 2022-04-11 – 2022-04-15 (×5): 500 mg via ORAL
  Filled 2022-04-10 (×5): qty 1

## 2022-04-10 MED ORDER — SODIUM CHLORIDE (PF) 0.9 % IJ SOLN
INTRAMUSCULAR | Status: AC
  Filled 2022-04-10: qty 10

## 2022-04-10 MED ORDER — FENTANYL CITRATE (PF) 100 MCG/2ML IJ SOLN
INTRAMUSCULAR | Status: AC
  Filled 2022-04-10: qty 2

## 2022-04-10 MED ORDER — DIPHENHYDRAMINE HCL 12.5 MG/5ML PO ELIX: 12.5000 mg | ORAL_SOLUTION | ORAL | Status: DC | PRN

## 2022-04-10 MED ORDER — ONDANSETRON HCL 4 MG PO TABS: 4.0000 mg | ORAL_TABLET | Freq: Four times a day (QID) | ORAL | Status: DC | PRN

## 2022-04-10 MED ORDER — PROPOFOL 10 MG/ML IV BOLUS
INTRAVENOUS | Status: AC
  Filled 2022-04-10: qty 20

## 2022-04-10 MED ORDER — METHOCARBAMOL 500 MG IVPB - SIMPLE MED
500.0000 mg | Freq: Four times a day (QID) | INTRAVENOUS | Status: DC | PRN
  Filled 2022-04-10: qty 55

## 2022-04-10 MED ORDER — BUPIVACAINE LIPOSOME 1.3 % IJ SUSP: 20.0000 mL | Freq: Once | INTRAMUSCULAR | Status: AC

## 2022-04-10 MED ORDER — HYDROCODONE-ACETAMINOPHEN 7.5-325 MG PO TABS
1.0000 | ORAL_TABLET | ORAL | Status: DC | PRN
  Administered 2022-04-10 – 2022-04-15 (×11): 2 via ORAL
  Filled 2022-04-10 (×12): qty 2

## 2022-04-10 MED ORDER — SENNOSIDES-DOCUSATE SODIUM 8.6-50 MG PO TABS
1.0000 | ORAL_TABLET | Freq: Every evening | ORAL | Status: DC | PRN
  Administered 2022-04-11 – 2022-04-13 (×3): 1 via ORAL
  Filled 2022-04-10 (×3): qty 1

## 2022-04-10 MED ORDER — ORAL CARE MOUTH RINSE: 15.0000 mL | Freq: Once | OROMUCOSAL | Status: AC

## 2022-04-10 MED ORDER — MORPHINE SULFATE (PF) 2 MG/ML IV SOLN
0.5000 mg | INTRAVENOUS | Status: DC | PRN
  Administered 2022-04-10 – 2022-04-11 (×3): 1 mg via INTRAVENOUS
  Filled 2022-04-10 (×3): qty 1

## 2022-04-10 MED ORDER — PROPOFOL 1000 MG/100ML IV EMUL
INTRAVENOUS | Status: AC
  Filled 2022-04-10: qty 100

## 2022-04-10 MED ORDER — LORATADINE 10 MG PO TABS: 10.0000 mg | ORAL_TABLET | Freq: Every day | ORAL | Status: DC | PRN

## 2022-04-10 MED ORDER — BUPIVACAINE IN DEXTROSE 0.75-8.25 % IT SOLN
INTRATHECAL | Status: DC | PRN
  Administered 2022-04-10: 1.8 mL via INTRATHECAL

## 2022-04-10 MED ORDER — PROPOFOL 500 MG/50ML IV EMUL
INTRAVENOUS | Status: DC | PRN
  Administered 2022-04-10: 75 ug/kg/min via INTRAVENOUS

## 2022-04-10 MED ORDER — PROPOFOL 500 MG/50ML IV EMUL
INTRAVENOUS | Status: AC
  Filled 2022-04-10: qty 50

## 2022-04-10 MED ORDER — ONDANSETRON HCL 4 MG/2ML IJ SOLN
INTRAMUSCULAR | Status: AC
  Filled 2022-04-10: qty 2

## 2022-04-10 MED ORDER — BUPIVACAINE LIPOSOME 1.3 % IJ SUSP
INTRAMUSCULAR | Status: AC
  Filled 2022-04-10: qty 20

## 2022-04-10 MED ORDER — BISACODYL 5 MG PO TBEC
5.0000 mg | DELAYED_RELEASE_TABLET | Freq: Every day | ORAL | Status: DC | PRN
  Administered 2022-04-13 – 2022-04-14 (×2): 5 mg via ORAL
  Filled 2022-04-10 (×2): qty 1

## 2022-04-10 MED ORDER — PHENYLEPHRINE HCL-NACL 20-0.9 MG/250ML-% IV SOLN
INTRAVENOUS | Status: DC | PRN
  Administered 2022-04-10: 20 ug/min via INTRAVENOUS

## 2022-04-10 MED ORDER — FAMOTIDINE 20 MG PO TABS
20.0000 mg | ORAL_TABLET | Freq: Every day | ORAL | Status: DC
  Administered 2022-04-11 – 2022-04-15 (×5): 20 mg via ORAL
  Filled 2022-04-10 (×5): qty 1

## 2022-04-10 MED ORDER — LACTATED RINGERS IV SOLN: INTRAVENOUS | Status: DC

## 2022-04-10 MED ORDER — ACETAMINOPHEN 325 MG PO TABS
325.0000 mg | ORAL_TABLET | Freq: Four times a day (QID) | ORAL | Status: DC | PRN
  Administered 2022-04-14: 650 mg via ORAL
  Filled 2022-04-10: qty 2

## 2022-04-10 MED ORDER — ENOXAPARIN SODIUM 40 MG/0.4ML IJ SOSY
40.0000 mg | PREFILLED_SYRINGE | INTRAMUSCULAR | Status: DC
  Administered 2022-04-11 – 2022-04-15 (×5): 40 mg via SUBCUTANEOUS
  Filled 2022-04-10 (×5): qty 0.4

## 2022-04-10 MED ORDER — STERILE WATER FOR IRRIGATION IR SOLN
Status: DC | PRN
  Administered 2022-04-10: 2000 mL

## 2022-04-10 MED ORDER — TRAMADOL HCL 50 MG PO TABS
50.0000 mg | ORAL_TABLET | Freq: Four times a day (QID) | ORAL | Status: DC
  Administered 2022-04-10 – 2022-04-15 (×15): 50 mg via ORAL
  Filled 2022-04-10 (×15): qty 1

## 2022-04-10 MED ORDER — PANTOPRAZOLE SODIUM 20 MG PO TBEC
20.0000 mg | DELAYED_RELEASE_TABLET | Freq: Every day | ORAL | Status: DC
  Administered 2022-04-11 – 2022-04-15 (×5): 20 mg via ORAL
  Filled 2022-04-10 (×5): qty 1

## 2022-04-10 MED ORDER — AMLODIPINE BESYLATE 10 MG PO TABS
10.0000 mg | ORAL_TABLET | Freq: Every day | ORAL | Status: DC
  Administered 2022-04-12 – 2022-04-15 (×3): 10 mg via ORAL
  Filled 2022-04-10 (×4): qty 1

## 2022-04-10 MED ORDER — FENTANYL CITRATE PF 50 MCG/ML IJ SOSY
25.0000 ug | PREFILLED_SYRINGE | INTRAMUSCULAR | Status: DC | PRN
  Administered 2022-04-10: 50 ug via INTRAVENOUS
  Administered 2022-04-10 (×2): 25 ug via INTRAVENOUS

## 2022-04-10 MED ORDER — CEFAZOLIN SODIUM-DEXTROSE 2-4 GM/100ML-% IV SOLN
2.0000 g | INTRAVENOUS | Status: AC
  Administered 2022-04-10: 2 g via INTRAVENOUS
  Filled 2022-04-10: qty 100

## 2022-04-10 MED ORDER — PHENYLEPHRINE 80 MCG/ML (10ML) SYRINGE FOR IV PUSH (FOR BLOOD PRESSURE SUPPORT)
PREFILLED_SYRINGE | INTRAVENOUS | Status: DC | PRN
  Administered 2022-04-10 (×3): 80 ug via INTRAVENOUS

## 2022-04-10 MED ORDER — BENZONATATE 100 MG PO CAPS: 100.0000 mg | ORAL_CAPSULE | Freq: Three times a day (TID) | ORAL | Status: DC | PRN

## 2022-04-10 MED ORDER — ONDANSETRON HCL 4 MG/2ML IJ SOLN
4.0000 mg | Freq: Four times a day (QID) | INTRAMUSCULAR | Status: DC | PRN
  Administered 2022-04-11: 4 mg via INTRAVENOUS
  Filled 2022-04-10: qty 2

## 2022-04-10 MED ORDER — CEFAZOLIN SODIUM-DEXTROSE 1-4 GM/50ML-% IV SOLN
1.0000 g | Freq: Four times a day (QID) | INTRAVENOUS | Status: AC
  Administered 2022-04-10 – 2022-04-11 (×3): 1 g via INTRAVENOUS
  Filled 2022-04-10 (×3): qty 50

## 2022-04-10 MED ORDER — POVIDONE-IODINE 10 % EX SWAB
2.0000 | Freq: Once | CUTANEOUS | Status: AC
  Administered 2022-04-10: 2 via TOPICAL

## 2022-04-10 MED ORDER — FENTANYL CITRATE (PF) 100 MCG/2ML IJ SOLN
INTRAMUSCULAR | Status: DC | PRN
  Administered 2022-04-10: 50 ug via INTRAVENOUS

## 2022-04-10 MED ORDER — LOSARTAN POTASSIUM 50 MG PO TABS
100.0000 mg | ORAL_TABLET | Freq: Every day | ORAL | Status: DC
  Administered 2022-04-12 – 2022-04-15 (×4): 100 mg via ORAL
  Filled 2022-04-10 (×5): qty 2

## 2022-04-10 MED ORDER — METHOCARBAMOL 500 MG PO TABS
500.0000 mg | ORAL_TABLET | Freq: Four times a day (QID) | ORAL | Status: DC | PRN
  Administered 2022-04-10 – 2022-04-15 (×14): 500 mg via ORAL
  Filled 2022-04-10 (×14): qty 1

## 2022-04-10 MED ORDER — GABAPENTIN 300 MG PO CAPS
300.0000 mg | ORAL_CAPSULE | Freq: Two times a day (BID) | ORAL | Status: DC
  Administered 2022-04-10 – 2022-04-15 (×10): 300 mg via ORAL
  Filled 2022-04-10 (×11): qty 1

## 2022-04-10 MED ORDER — CHLORHEXIDINE GLUCONATE 0.12 % MT SOLN
15.0000 mL | Freq: Once | OROMUCOSAL | Status: AC
Start: 2022-04-10 — End: 2022-04-10
  Administered 2022-04-10: 15 mL via OROMUCOSAL

## 2022-04-10 MED ORDER — HYDROCODONE-ACETAMINOPHEN 5-325 MG PO TABS
ORAL_TABLET | ORAL | Status: AC
  Filled 2022-04-10: qty 1

## 2022-04-10 MED ORDER — SODIUM CHLORIDE (PF) 0.9 % IJ SOLN: INTRAMUSCULAR | Status: DC | PRN

## 2022-04-10 MED ORDER — DEXAMETHASONE SODIUM PHOSPHATE 10 MG/ML IJ SOLN
INTRAMUSCULAR | Status: AC
  Filled 2022-04-10: qty 1

## 2022-04-10 MED ORDER — SODIUM CHLORIDE 0.9 % IV SOLN
INTRAVENOUS | Status: DC | PRN
  Administered 2022-04-10: 60 mL

## 2022-04-10 MED ORDER — MAGNESIUM CITRATE PO SOLN: 1.0000 | Freq: Once | ORAL | Status: DC | PRN

## 2022-04-10 MED ORDER — SODIUM CHLORIDE 0.9 % IV SOLN: INTRAVENOUS | Status: DC

## 2022-04-10 MED ORDER — 0.9 % SODIUM CHLORIDE (POUR BTL) OPTIME
TOPICAL | Status: DC | PRN
  Administered 2022-04-10: 1000 mL

## 2022-04-10 MED ORDER — PROPOFOL 10 MG/ML IV BOLUS
INTRAVENOUS | Status: DC | PRN
  Administered 2022-04-10: 10 mg via INTRAVENOUS

## 2022-04-10 MED ORDER — TRANEXAMIC ACID-NACL 1000-0.7 MG/100ML-% IV SOLN
1000.0000 mg | INTRAVENOUS | Status: AC
  Administered 2022-04-10: 1000 mg via INTRAVENOUS
  Filled 2022-04-10: qty 100

## 2022-04-10 MED ORDER — SODIUM CHLORIDE (PF) 0.9 % IJ SOLN
INTRAMUSCULAR | Status: AC
  Filled 2022-04-10: qty 50

## 2022-04-10 MED ORDER — ACETAMINOPHEN 500 MG PO TABS
500.0000 mg | ORAL_TABLET | Freq: Four times a day (QID) | ORAL | Status: AC
  Administered 2022-04-10 – 2022-04-11 (×3): 500 mg via ORAL
  Filled 2022-04-10 (×3): qty 1

## 2022-04-10 MED ORDER — ALPRAZOLAM 0.25 MG PO TABS
0.2500 mg | ORAL_TABLET | Freq: Two times a day (BID) | ORAL | Status: DC
  Administered 2022-04-10 – 2022-04-15 (×10): 0.25 mg via ORAL
  Filled 2022-04-10 (×11): qty 1

## 2022-04-10 MED ORDER — SODIUM CHLORIDE 0.9 % IR SOLN
Status: DC | PRN
  Administered 2022-04-10: 1000 mL

## 2022-04-10 MED ORDER — DEXAMETHASONE SODIUM PHOSPHATE 10 MG/ML IJ SOLN
8.0000 mg | Freq: Once | INTRAMUSCULAR | Status: AC
  Administered 2022-04-10: 4 mg via INTRAVENOUS

## 2022-04-10 SURGICAL SUPPLY — 58 items
ADH SKN CLS APL DERMABOND .7 (GAUZE/BANDAGES/DRESSINGS) ×2
ATTUNE PSFEM RTSZ4 NARCEM KNEE (Femur) IMPLANT
ATTUNE PSRP INSR SZ4 7 KNEE (Insert) IMPLANT
BAG COUNTER SPONGE SURGICOUNT (BAG) ×1 IMPLANT
BAG SPEC THK2 15X12 ZIP CLS (MISCELLANEOUS) ×1
BAG SPNG CNTER NS LX DISP (BAG) ×1
BAG ZIPLOCK 12X15 (MISCELLANEOUS) ×1 IMPLANT
BASEPLATE TIBIAL ROTATING SZ 4 (Knees) IMPLANT
BLADE SAG 18X100X1.27 (BLADE) ×1 IMPLANT
BLADE SAW SGTL 11.0X1.19X90.0M (BLADE) ×1 IMPLANT
BNDG CMPR STD VLCR NS LF 5.8X4 (GAUZE/BANDAGES/DRESSINGS) ×1
BNDG ELASTIC 4X5.8 VLCR NS LF (GAUZE/BANDAGES/DRESSINGS) IMPLANT
BNDG ELASTIC 4X5.8 VLCR STR LF (GAUZE/BANDAGES/DRESSINGS) ×1 IMPLANT
BNDG ELASTIC 6X5.8 VLCR STR LF (GAUZE/BANDAGES/DRESSINGS) ×1 IMPLANT
BONE CEMENT GENTAMICIN (Cement) ×2 IMPLANT
BOWL SMART MIX CTS (DISPOSABLE) ×1 IMPLANT
BSPLAT TIB 4 CMNT ROT PLAT STR (Knees) ×1 IMPLANT
CEMENT BONE GENTAMICIN 40 (Cement) IMPLANT
COVER SURGICAL LIGHT HANDLE (MISCELLANEOUS) ×1 IMPLANT
CUFF TOURN SGL QUICK 34 (TOURNIQUET CUFF) ×1
CUFF TRNQT CYL 34X4.125X (TOURNIQUET CUFF) ×1 IMPLANT
DERMABOND ADVANCED .7 DNX12 (GAUZE/BANDAGES/DRESSINGS) ×1 IMPLANT
DRAPE INCISE IOBAN 66X45 STRL (DRAPES) ×1 IMPLANT
DRAPE U-SHAPE 47X51 STRL (DRAPES) ×1 IMPLANT
DRSG AQUACEL AG ADV 3.5X10 (GAUZE/BANDAGES/DRESSINGS) ×1 IMPLANT
DURAPREP 26ML APPLICATOR (WOUND CARE) ×2 IMPLANT
ELECT REM PT RETURN 15FT ADLT (MISCELLANEOUS) ×1 IMPLANT
GLOVE BIOGEL PI IND STRL 7.5 (GLOVE) ×1 IMPLANT
GLOVE BIOGEL PI IND STRL 8 (GLOVE) ×1 IMPLANT
GLOVE ECLIPSE 8.0 STRL XLNG CF (GLOVE) ×1 IMPLANT
GLOVE SURG ORTHO 9.0 STRL STRW (GLOVE) ×1 IMPLANT
GLOVE SURG SS PI 7.0 STRL IVOR (GLOVE) ×1 IMPLANT
GOWN SPEC L4 XLG W/TWL (GOWN DISPOSABLE) ×2 IMPLANT
HANDPIECE INTERPULSE COAX TIP (DISPOSABLE) ×1
KIT TURNOVER KIT A (KITS) IMPLANT
NS IRRIG 1000ML POUR BTL (IV SOLUTION) ×1 IMPLANT
PACK ICE MAXI GEL EZY WRAP (MISCELLANEOUS) IMPLANT
PACK TOTAL KNEE CUSTOM (KITS) ×1 IMPLANT
PATELLA MEDIAL ATTUN 35MM KNEE (Knees) IMPLANT
PROTECTOR NERVE ULNAR (MISCELLANEOUS) ×1 IMPLANT
SET HNDPC FAN SPRY TIP SCT (DISPOSABLE) ×1 IMPLANT
SET PAD KNEE POSITIONER (MISCELLANEOUS) ×1 IMPLANT
SOLUTION PRONTOSAN WOUND 350ML (IRRIGATION / IRRIGATOR) ×1 IMPLANT
SPIKE FLUID TRANSFER (MISCELLANEOUS) ×1 IMPLANT
SPONGE SURGIFOAM ABS GEL 100 (HEMOSTASIS) ×1 IMPLANT
STOCKINETTE 6  STRL (DRAPES) ×1
STOCKINETTE 6 STRL (DRAPES) ×1 IMPLANT
SUT MNCRL AB 3-0 PS2 18 (SUTURE) ×1 IMPLANT
SUT VIC AB 1 CT1 27 (SUTURE) ×3
SUT VIC AB 1 CT1 27XBRD ANTBC (SUTURE) ×3 IMPLANT
SUT VIC AB 2-0 CT1 27 (SUTURE) ×2
SUT VIC AB 2-0 CT1 TAPERPNT 27 (SUTURE) ×2 IMPLANT
SUT VLOC 180 0 24IN GS25 (SUTURE) ×1 IMPLANT
SYR 50ML LL SCALE MARK (SYRINGE) ×1 IMPLANT
TAPE STRIPS DRAPE STRL (GAUZE/BANDAGES/DRESSINGS) ×1 IMPLANT
TRAY CATH INTERMITTENT SS 16FR (CATHETERS) ×1 IMPLANT
TUBING CONNECTING 10 (TUBING) IMPLANT
WATER STERILE IRR 1000ML POUR (IV SOLUTION) ×2 IMPLANT

## 2022-04-10 NOTE — H&P (Signed)
TOTAL KNEE ADMISSION H&P  Patient is being admitted for right total knee arthroplasty.  Subjective:  Chief Complaint:right knee pain.  HPI: [name], 88 y.o. female, has a history of pain and functional disability in the right knee due to arthritis and has failed non-surgical conservative treatments for greater than 12 weeks to includeNSAID's and/or analgesics, corticosteriod injections, use of assistive devices, and activity modification.  Onset of symptoms was gradual, starting >10 years ago with gradually worsening course since that time. The patient noted no past surgery on the right knee(s).  Patient currently rates pain in the right knee(s) at 7 out of 10 with activity. Patient has night pain, worsening of pain with activity and weight bearing, pain that interferes with activities of daily living, pain with passive range of motion, and joint swelling.  Patient has evidence of periarticular osteophytes and joint space narrowing by imaging studies. This patient has had  no previous injury . There is no active infection.  Patient Active Problem List   Diagnosis Date Noted   Hypertension associated with type 2 diabetes mellitus ([place]) 02/25/2022   Aortic atherosclerosis ([place]) 02/25/2022   Chronic non-seasonal allergic rhinitis 02/25/2022   Diabetic peripheral neuropathy associated with type 2 diabetes mellitus ([place]) 02/25/2022   Severe protein-calorie malnutrition ([place]) 02/25/2022   Chronic anxiety 02/25/2022   SIADH (syndrome of inappropriate ADH production) ([place]) 02/25/2022   Pneumonia 02/17/2022   GERD (gastroesophageal reflux disease) 02/17/2022   Acute respiratory failure with hypoxia ([place]) 02/26/2021   Myalgia due to statin 02/15/2020   Cognitive dysfunction 03/04/2019   Generalized anxiety disorder 06/05/2018   Floaters in visual field, bilateral 03/23/2018   Living in assisted living 03/23/2018   Hyponatremia 03/08/2018   C. difficile diarrhea 03/03/2018   Osteoarthritis  of right knee 12/02/2017   Closed fracture of left orbital floor with routine healing 01/17/2017   Hyperlipidemia associated with type 2 diabetes mellitus ([place]) 10/02/2016   Pedal edema 10/02/2016   Insomnia 10/02/2016   SBO (small bowel obstruction) ([place]) 07/02/2016   Dyspnea on exertion 02/24/2016   Other fatigue    Leukocytosis 12/16/2014   Essential hypertension 12/15/2014   Type 2 diabetes mellitus with atherosclerosis of aorta ([place]) 02/15/2011   Past Medical History:  Diagnosis Date   Cognitive dysfunction 03/04/2019   Patient scores 21 out of 30 on [place] cognitive assessment September 2020   Diabetes mellitus without complication ([place])    Diverticulitis    Frailty 03/04/2019   H/O bilateral breast reduction surgery    Hyperlipidemia    a. intolerant to statins.    Hypertension     Past Surgical History:  Procedure Laterality Date   ABDOMINAL HYSTERECTOMY  2003   APPENDECTOMY     age 29   BREAST REDUCTION SURGERY     age 4   COLON SURGERY Left 2011   Hemicolectomy due to diverticulitis   EYE SURGERY  03/30/2009   cataract   KNEE SURGERY Right 2003   knee cap   LAPAROSCOPIC INCISIONAL / UMBILICAL / VENTRAL HERNIA REPAIR  02/23/2007   REDUCTION MAMMAPLASTY Bilateral 2001   REFRACTIVE SURGERY      Current Facility-Administered Medications  Medication Dose Route Frequency Provider Last Rate Last Admin   bupivacaine liposome (EXPAREL) 1.3 % injection 266 mg  20 mL Other Once Hermena Swint R, PA       ceFAZolin (ANCEF) IVPB 2g/100 mL premix  2 g Intravenous On Call to OR Drue Novel, PA  dexamethasone (DECADRON) injection 8 mg  8 mg Intravenous Once Hershal Eriksson R, PA       lactated ringers infusion   Intravenous Continuous Santa Lighter, MD 10 mL/hr at 04/10/22 0658 New Bag at 04/10/22 3235   lactated ringers infusion   Intravenous Continuous Kaydie Petsch R, PA       tranexamic acid (CYKLOKAPRON) IVPB 1,000 mg  1,000 mg Intravenous To OR Luevenia Mcavoy R,  PA       Facility-Administered Medications Ordered in Other Encounters  Medication Dose Route Frequency Provider Last Rate Last Admin   fentaNYL (SUBLIMAZE) injection   Intravenous Anesthesia Intra-op Good, Melanie B, CRNA   50 mcg at 04/10/22 0810   Allergies  Allergen Reactions   Hydrocortisone Itching   Lisinopril     Other reaction(s): Lethargy (intolerance)   Phenergan [Promethazine Hcl] Other (See Comments)    hallucinations   Statins Other (See Comments)    Severe myalgias   Trazodone And Nefazodone Cough   Prednisone Rash    Social History   Tobacco Use   Smoking status: Never   Smokeless tobacco: Never  Substance Use Topics   Alcohol use: No    Family History  Problem Relation Age of Onset   Hypertension Father        kidney   Stroke Father    Cancer Father    Cancer Mother        ovaian   Diabetes Brother    Hypertension Sister      Review of Systems  Constitutional: Negative.   Respiratory: Negative.    Cardiovascular: Negative.   Allergic/Immunologic: Negative.   Hematological: Negative.   Psychiatric/Behavioral: Negative.      Objective:  Physical Exam Constitutional:      Appearance: Normal appearance. She is normal weight.  HENT:     Head: Normocephalic and atraumatic.  Cardiovascular:     Rate and Rhythm: Normal rate and regular rhythm.     Pulses: Normal pulses.     Heart sounds: Normal heart sounds.  Pulmonary:     Effort: Pulmonary effort is normal.     Breath sounds: Normal breath sounds.  Musculoskeletal:     Comments: On examination of the right knee, she has a moderate effusion. Not tense. Valgus knee. Range of motion 7 to 110 degrees. 2+ crepitation. Diffusely tender.   Skin:    General: Skin is warm and dry.     Capillary Refill: Capillary refill takes less than 2 seconds.  Neurological:     General: No focal deficit present.     Mental Status: She is alert and oriented to person, place, and time.  Psychiatric:        Mood  and Affect: Mood normal.        Behavior: Behavior normal.        Thought Content: Thought content normal.        Judgment: Judgment normal.     Vital signs in last 24 hours: Temp:  [98 F (36.7 C)] 98 F (36.7 C) (10/11 0634) Pulse Rate:  [77] 77 (10/11 0634) Resp:  [18] 18 (10/11 0634) BP: (156)/(61) 156/61 (10/11 0634) SpO2:  [97 %] 97 % (10/11 0634) Weight:  [81.6 kg] 81.6 kg (10/11 0650)  Labs:   Estimated body mass index is 27.37 kg/m as calculated from the following:   Height as of 03/29/22: '5\' 8"'$  (1.727 m).   Weight as of this encounter: 81.6 kg.   Imaging Review Plain radiographs demonstrate  severe degenerative joint disease of the right knee(s). The overall alignment issignificant valgus. The bone quality appears to be good for age and reported activity level.      Assessment/Plan:  End stage arthritis, right knee   The patient history, physical examination, clinical judgment of the provider and imaging studies are consistent with end stage degenerative joint disease of the right knee(s) and total knee arthroplasty is deemed medically necessary. The treatment options including medical management, injection therapy arthroscopy and arthroplasty were discussed at length. The risks and benefits of total knee arthroplasty were presented and reviewed. The risks due to aseptic loosening, infection, stiffness, patella tracking problems, thromboembolic complications and other imponderables were discussed. The patient acknowledged the explanation, agreed to proceed with the plan and consent was signed. Patient is being admitted for inpatient treatment for surgery, pain control, PT, OT, prophylactic antibiotics, VTE prophylaxis, progressive ambulation and ADL's and discharge planning. The patient is planning to be discharged to skilled nursing facility     Patient's anticipated LOS is less than 2 midnights, meeting these requirements: - Younger than 67 - Lives within 1 hour  of care - Has a competent adult at home to recover with post-op recover - NO history of  - Chronic pain requiring opiods  - Diabetes  - Coronary Artery Disease  - Heart failure  - Heart attack  - Stroke  - DVT/VTE  - Cardiac arrhythmia  - Respiratory Failure/COPD  - Renal failure  - Anemia  - Advanced Liver disease

## 2022-04-10 NOTE — Transfer of Care (Signed)
Immediate Anesthesia Transfer of Care Note  Patient: [name]  Procedure(s) Performed: TOTAL KNEE ARTHROPLASTY (Right: Knee)  Patient Location: PACU  Anesthesia Type:Spinal  Level of Consciousness: awake, alert  and patient cooperative  Airway & Oxygen Therapy: Patient Spontanous Breathing and Patient connected to face mask oxygen  Post-op Assessment: Report given to RN and Post -op Vital signs reviewed and stable  Post vital signs: Reviewed and stable  Last Vitals:  Vitals Value Taken Time  BP 124/58 04/10/22 1111  Temp    Pulse 83 04/10/22 1114  Resp 20 04/10/22 1114  SpO2 97 % 04/10/22 1114  Vitals shown include unvalidated device data.  Last Pain:  Vitals:   04/10/22 0650  TempSrc:   PainSc: 0-No pain         Complications: No notable events documented.

## 2022-04-10 NOTE — Anesthesia Procedure Notes (Signed)
Procedure Name: MAC Date/Time: 04/10/2022 8:45 AM  Performed by: Eben Burow, CRNAPre-anesthesia Checklist: Patient identified, Emergency Drugs available, Suction available, Patient being monitored and Timeout performed Oxygen Delivery Method: Simple face mask Placement Confirmation: positive ETCO2

## 2022-04-10 NOTE — Interval H&P Note (Signed)
History and Physical Interval Note:  04/10/2022 8:29 AM  [name]  has presented today for surgery, with the diagnosis of Right knee osteoarthritis.  The various methods of treatment have been discussed with the patient and family. After consideration of risks, benefits and other options for treatment, the patient has consented to  Procedure(s) with comments: TOTAL KNEE ARTHROPLASTY (Right) - adductor canal as a surgical intervention.  The patient's history has been reviewed, patient examined, no change in status, stable for surgery.  I have reviewed the patient's chart and labs.  Questions were answered to the patient's satisfaction.     [name]

## 2022-04-10 NOTE — Anesthesia Postprocedure Evaluation (Signed)
Anesthesia Post Note  Patient: [name]  Procedure(s) Performed: TOTAL KNEE ARTHROPLASTY (Right: Knee)     Patient location during evaluation: PACU Anesthesia Type: Spinal Level of consciousness: awake and sedated Pain management: pain level controlled Vital Signs Assessment: post-procedure vital signs reviewed and stable Respiratory status: spontaneous breathing Cardiovascular status: stable Postop Assessment: no headache, no backache, spinal receding, patient able to bend at knees and no apparent nausea or vomiting Anesthetic complications: no   No notable events documented.  Last Vitals:  Vitals:   04/10/22 1215 04/10/22 1230  BP: 136/61 (!) 162/63  Pulse: 61 79  Resp: 14 15  Temp:    SpO2: 100% 100%    Last Pain:  Vitals:   04/10/22 1215  TempSrc:   PainSc: 5                  John F Salome Arnt

## 2022-04-10 NOTE — Evaluation (Signed)
Physical Therapy Evaluation Patient Details Name: [name] MRN: [id number] DOB: Nov 19, 1931 Today's Date: 04/10/2022  History of Present Illness  Pt is an 88yo female presenting s/p R-TKA on 04/10/22. PMH: cognitive dysfunction, DM with peripheral neuropathy, HLD, HTN, PNA, GERD,   Clinical Impression  NYELI HOLTMEYER is a 88 y.o. female POD 0 s/p R-TKA. Patient reports modified independence using RW with mobility at baseline. Patient is now limited by functional impairments (see PT problem list below) and requires min assist for bed mobility and min guard for transfers. Patient was able to ambulate 10 feet with RW and min guard level of assist. Patient instructed in exercise to facilitate ROM and circulation to manage edema. Provided incentive spirometer and with Vcs pt able to achieve 1741m. Patient will benefit from continued skilled PT interventions to address impairments and progress towards PLOF. Acute PT will follow to progress mobility and HEP in [facility]       Recommendations for follow up therapy are one component of a multi-disciplinary discharge planning process, led by the attending physician.  Recommendations may be updated based on patient status, additional functional criteria and insurance authorization.  Follow Up Recommendations Skilled nursing-short term rehab (<3 hours/day) Can patient physically be transported by private vehicle: Yes    Assistance Recommended at Discharge Frequent or constant Supervision/Assistance  Patient can return home with the following  A little help with walking and/or transfers;A little help with bathing/dressing/bathroom;Assistance with cooking/housework;Assist for transportation;Help with stairs or ramp for entrance    Equipment Recommendations None recommended by PT  Recommendations for Other Services       Functional Status Assessment Patient has had a recent decline in their  functional status and demonstrates the ability to make significant improvements in function in a reasonable and predictable amount of time.     Precautions / Restrictions Precautions Precautions: Fall;Knee Precaution Booklet Issued: No Precaution Comments: no pillow under the knee Restrictions Weight Bearing Restrictions: No Other Position/Activity Restrictions: wbat      Mobility  Bed Mobility Overal bed mobility: Needs Assistance Bed Mobility: Supine to Sit     Supine to sit: Min assist     General bed mobility comments: Min assist to bring RLE off bed    Transfers Overall transfer level: Needs assistance Equipment used: Rolling walker (2 wheels) Transfers: Sit to/from Stand Sit to Stand: Min guard           General transfer comment: For safety only, VCs for hand placement    Ambulation/Gait Ambulation/Gait assistance: Min guard, +2 safety/equipment, Min assist Gait Distance (Feet): 10 Feet Assistive device: Rolling walker (2 wheels) Gait Pattern/deviations: Step-to pattern Gait velocity: decreased     General Gait Details: Pt ambulated with RW and min guard with +2 for recliner follow. REquired  min assist to prevent posterior LOB as pt stumbled briefly while turning to continue ambulation.  Stairs            Wheelchair Mobility    Modified Rankin (Stroke Patients Only)       Balance Overall balance assessment: Needs assistance Sitting-balance support: Feet supported, No upper extremity supported Sitting balance-Leahy Scale: Good     Standing balance support: Reliant on assistive device for balance, During functional activity, Bilateral upper extremity supported Standing balance-Leahy Scale: Poor  Pertinent Vitals/Pain Pain Assessment Pain Assessment: 0-10 Pain Score: 5     Home Living Family/patient expects to be discharged to:: Assisted living ([place]) Living Arrangements: Alone                Home Equipment: Conservation officer, nature (2 wheels) Additional Comments: [facility]    Prior Function Prior Level of Function : Needs assist             Mobility Comments: RW for outside the room ADLs Comments: Supervision     Hand Dominance        Extremity/Trunk Assessment   Upper Extremity Assessment Upper Extremity Assessment: Overall WFL for tasks assessed    Lower Extremity Assessment Lower Extremity Assessment: RLE deficits/detail;LLE deficits/detail RLE Deficits / Details: MMT ank DF/PF 5/5, no extensor lag ntoed RLE Sensation: WNL LLE Deficits / Details: MMT ank DF/PF 5/5 LLE Sensation: WNL    Cervical / Trunk Assessment Cervical / Trunk Assessment: Kyphotic  Communication   Communication: No difficulties  Cognition Arousal/Alertness: Awake/alert Behavior During Therapy: WFL for tasks assessed/performed Overall Cognitive Status: Within Functional Limits for tasks assessed                                          General Comments General comments (skin integrity, edema, etc.): D-I-L Dawn present    Exercises Total Joint Exercises Ankle Circles/Pumps: AROM, Both, 20 reps   Assessment/Plan    PT Assessment Patient needs continued PT services  PT Problem List Decreased strength;Decreased range of motion;Decreased activity tolerance;Decreased balance;Decreased mobility;Decreased coordination;Pain       PT Treatment Interventions DME instruction;Gait training;Stair training;Functional mobility training;Therapeutic activities;Therapeutic exercise;Balance training;Neuromuscular re-education;Patient/family education    PT Goals (Current goals can be found in the Care Plan section)  Acute Rehab PT Goals Patient Stated Goal: walk 1 mile PT Goal Formulation: With patient Time For Goal Achievement: 04/17/22 Potential to Achieve Goals: Good    Frequency 7X/week     Co-evaluation                AM-PAC PT "6 Clicks" Mobility  Outcome Measure Help needed turning from your back to your side while in a flat bed without using bedrails?: None Help needed moving from lying on your back to sitting on the side of a flat bed without using bedrails?: A Little Help needed moving to and from a bed to a chair (including a wheelchair)?: A Little Help needed standing up from a chair using your arms (e.g., wheelchair or bedside chair)?: A Little Help needed to walk in hospital room?: A Little Help needed climbing 3-5 steps with a railing? : A Lot 6 Click Score: 18    End of Session Equipment Utilized During Treatment: Gait belt Activity Tolerance: Patient tolerated treatment well;No increased pain Patient left: in chair;with call bell/phone within reach;with chair alarm set;with family/visitor present Nurse Communication: Mobility status PT Visit Diagnosis: Pain;Difficulty in walking, not elsewhere classified (R26.2) Pain - Right/Left: Right Pain - part of body: Knee    Time: 5170-0174 PT Time Calculation (min) (ACUTE ONLY): 15 min   Charges:   PT Evaluation $PT Eval Low Complexity: [place], PT, DPT [facility] Department Office: [phone] Weekend pager: [phone]  Coolidge Breeze 04/10/2022, 3:07 PM

## 2022-04-10 NOTE — Anesthesia Procedure Notes (Deleted)
Spinal  Patient location during procedure: OR Start time: 04/10/2022 8:55 AM Reason for block: surgical anesthesia Staffing Performed: anesthesiologist  Anesthesiologist: Belinda Block, MD Resident/CRNA: Eben Burow, CRNA Performed by: Eben Burow, CRNA Authorized by: Belinda Block, MD   Preanesthetic Checklist Completed: patient identified, IV checked, site marked, risks and benefits discussed, surgical consent, monitors and equipment checked, pre-op evaluation and timeout performed Spinal Block Patient position: sitting Prep: DuraPrep and site prepped and draped Patient monitoring: heart rate, cardiac monitor, continuous pulse ox and blood pressure Approach: midline Location: L3-4 Injection technique: single-shot Needle Needle type: Pencan  Needle gauge: 24 G Needle length: 10 cm Assessment Events: CSF return Additional Notes Pt placed in sitting position, spinal kit expiration date checked and verified, 1 attempt by Jefm Miles, CRNA, 1 attempt by Dr Nyoka Cowden, + CSF, - heme, pt tolerated well. Adequate sensory level. Dr Nyoka Cowden present and supervising throughout placement of SAB.

## 2022-04-10 NOTE — Anesthesia Procedure Notes (Signed)
Spinal  Patient location during procedure: OR Start time: 04/10/2022 8:40 AM End time: 04/10/2022 8:55 AM Staffing Performed: anesthesiologist  Anesthesiologist: Belinda Block, MD Performed by: Belinda Block, MD Authorized by: Belinda Block, MD   Preanesthetic Checklist Completed: patient identified, IV checked, site marked, risks and benefits discussed, surgical consent, monitors and equipment checked, pre-op evaluation and timeout performed Spinal Block Patient position: sitting Prep: ChloraPrep Approach: midline Location: L3-4 Injection technique: single-shot Needle Needle type: Introducer and Sprotte  Needle gauge: 24 G Assessment Sensory level: T12 Events: second provider Additional Notes Clear CSF neg heme

## 2022-04-10 NOTE — Op Note (Signed)
DATE OF SURGERY:  04/10/2022  TIME: 10:42 AM  PATIENT NAME:  [name]    AGE: 88 y.o.   PRE-OPERATIVE DIAGNOSIS:  Right knee osteoarthritis  POST-OPERATIVE DIAGNOSIS:  Right knee osteoarthritis  PROCEDURE:  Procedure(s): TOTAL KNEE ARTHROPLASTY  SURGEON:  Lanae Federer ANDREW  ASSISTANT:  Leeanne Haus, PA-C, present and scrubbed throughout the case, critical for assistance with exposure, retraction, instrumentation, and closure.  OPERATIVE IMPLANTS: Depuy PFC Attune Rotating Platform.  Femur size 4, Tibia size 4, Patella size 35 3-peg oval button, with a 7 mm polyethylene insert.   PREOPERATIVE INDICATIONS:   [name] is a 88 y.o. year old female with end stage bone on bone arthritis of the knee who failed conservative treatment and elected for Total Knee Arthroplasty.   The risks, benefits, and alternatives were discussed at length including but not limited to the risks of infection, bleeding, nerve injury, stiffness, blood clots, the need for revision surgery, cardiopulmonary complications, among others, and they were willing to proceed.  OPERATIVE DESCRIPTION:  The patient was brought to the operative room and placed in a supine position.  Spinal anesthesia was administered.  IV antibiotics were given.  The lower extremity was prepped and draped in the usual sterile fashion.  Time out was performed.  The leg was elevated and exsanguinated and the tourniquet was inflated.  Anterior quadriceps tendon splitting approach was performed.  The patella was retracted and osteophytes were removed.  The anterior horn of the medial and lateral meniscus was removed and cruciate ligaments resected.   The distal femur was opened with the drill and the intramedullary distal femoral cutting jig was utilized, set at 5 degrees resecting 10 mm off the distal femur.  Care was taken to protect the collateral ligaments.  The distal femoral sizing jig was applied, taking care to avoid  notching.  Then the 4-in-1 cutting jig was applied and the anterior and posterior femur was cut, along with the chamfer cuts.    Then the extramedullary tibial cutting jig was utilized making the appropriate cut using the anterior tibial crest as a reference building in appropriate posterior slope.  Care was taken during the cut to protect the medial and collateral ligaments.  The proximal tibia was removed along with the posterior horns of the menisci.   The posterior medial femoral osteophytes and posterior lateral femoral osteophytes were removed.    The flexion gap was then measured and was symmetric with the extension gap, measured at 7.  I completed the distal femoral preparation using the appropriate jig to prepare the box.  The patella was then measured, and cut with the saw.    The proximal tibia sized and prepared accordingly with the reamer and the punch, and then all components were trialed with the trial insert.  The knee was found to have excellent balance and full motion.    The above named components were then cemented into place and all excess cement was removed.  The trial polyethylene component was in place during cementation, and then was exchanged for the real polyethylene component.    The knee was easily taken through a range of motion and the patella tracked well and the knee irrigated copiously and the parapatellar and subcutaneous tissue closed with vicryl, and monocryl with steri strips for the skin.  The arthrotomy was closed at 90 of flexion. The wounds were dressed with sterile gauze and the tourniquet released and the patient was awakened and returned to the PACU in  stable and satisfactory condition.  There were no complications.  Total tourniquet time was 83 minutes.

## 2022-04-10 NOTE — Plan of Care (Signed)
  Problem: Nutrition: Goal: Adequate nutrition will be maintained Outcome: Progressing   Problem: Coping: Goal: Level of anxiety will decrease Outcome: Progressing   Problem: Elimination: Goal: Will not experience complications related to urinary retention Outcome: Progressing   Problem: Pain Managment: Goal: General experience of comfort will improve Outcome: Progressing   

## 2022-04-10 NOTE — Anesthesia Procedure Notes (Addendum)
Anesthesia Regional Block: Adductor canal block   Pre-Anesthetic Checklist: , timeout performed,  Correct Patient, Correct Site, Correct Laterality,  Correct Procedure, Correct Position, site marked,  Risks and benefits discussed,  Surgical consent,  Pre-op evaluation,  At surgeon's request and post-op pain management  Laterality: Right  Prep: chloraprep       Needles:  Injection technique: Single-shot      Additional Needles:   Narrative:  Start time: 04/12/2022 8:10 AM End time: 04/12/2022 8:25 AM Injection made incrementally with aspirations every 5 mL.  Performed by: Personally  Anesthesiologist: Belinda Block, MD

## 2022-04-11 ENCOUNTER — Ambulatory Visit: Payer: Medicare Other | Admitting: Family Medicine

## 2022-04-11 ENCOUNTER — Encounter (HOSPITAL_COMMUNITY): Payer: Self-pay | Admitting: Specialist

## 2022-04-11 MED ORDER — ASPIRIN 81 MG PO TBEC: 81.0000 mg | DELAYED_RELEASE_TABLET | Freq: Two times a day (BID) | ORAL | 0 refills | Status: AC

## 2022-04-11 MED ORDER — BISACODYL 5 MG PO TBEC: 5.0000 mg | DELAYED_RELEASE_TABLET | Freq: Every day | ORAL | 0 refills | Status: DC | PRN

## 2022-04-11 MED ORDER — POLYVINYL ALCOHOL 1.4 % OP SOLN: 1.0000 [drp] | Freq: Every day | OPHTHALMIC | Status: DC | PRN

## 2022-04-11 MED ORDER — PROSIGHT PO TABS
1.0000 | ORAL_TABLET | Freq: Every day | ORAL | Status: DC
  Administered 2022-04-11 – 2022-04-14 (×4): 1 via ORAL
  Filled 2022-04-11 (×4): qty 1

## 2022-04-11 MED ORDER — HYDROCODONE-ACETAMINOPHEN 5-325 MG PO TABS: 1.0000 | ORAL_TABLET | ORAL | 0 refills | Status: DC | PRN

## 2022-04-11 MED ORDER — ONDANSETRON HCL 4 MG PO TABS: 4.0000 mg | ORAL_TABLET | Freq: Four times a day (QID) | ORAL | 0 refills | Status: DC | PRN

## 2022-04-11 MED ORDER — TRAMADOL HCL 50 MG PO TABS: 50.0000 mg | ORAL_TABLET | Freq: Four times a day (QID) | ORAL | 0 refills | Status: DC | PRN

## 2022-04-11 MED ORDER — METHOCARBAMOL 500 MG PO TABS: 500.0000 mg | ORAL_TABLET | Freq: Four times a day (QID) | ORAL | 0 refills | Status: DC | PRN

## 2022-04-11 NOTE — Plan of Care (Signed)
Problem: Clinical Measurements: Goal: Ability to maintain clinical measurements within normal limits will improve Outcome: Progressing   Problem: Activity: Goal: Risk for activity intolerance will decrease Outcome: Progressing   Problem: Pain Managment: Goal: General experience of comfort will improve Outcome: [place], RN 04/11/22 9:30 AM

## 2022-04-11 NOTE — Progress Notes (Signed)
Patient ID: [name], female   DOB: 14-Jun-1932, 88 y.o.   MRN: [id number]  Chart was reviewed and the somewhat confusing situation entertained.  At this point in time it is a logistical situation between admission here versus skilled nurse facility versus return to [facility] [place].  Unfortunately, she seems to be caught somewhat in the red type of discharge.  The discussions prior to admission between patient and family Allyne Gee and in center were noted.  At this point in time is not a medical decision but a basically an insurance and Medicare and potential facilities after discharge.  We will see how patient does tomorrow and therapies.  Hopefully, the situation can be calmed to make patient and her son tent with the plans.  She will be seen tomorrow and further plans based upon that.

## 2022-04-11 NOTE — Discharge Summary (Signed)
Physician Discharge Summary  Patient ID: [name] MRN: [id number] DOB/AGE: January 13, 1932 88 y.o.  Admit date: 04/10/2022 Discharge date: 04/15/2022  Admission Diagnoses: Right knee Osteoarthritis Discharge Diagnoses:  Principal Problem:   Osteoarthritis of right knee   Discharged Condition: good  Hospital Course: Patient was admitted on October 11 for a right total knee arthroplasty due to end-stage right knee osteoarthritis.  Patient tolerated surgery well and was sent to PACU in stable condition.  Patient was sent to the postop floor.  She was able to get up and walk with physical therapy about 10 feet.  Postop day 1 patient doing well, no events overnight.  Unfortunately patient has run into an issue with her postoperative plans today.  She was supposed to be going to [facility] for a short term rehab facility postoperatively, but insurance is causing an issue.  Nurse case management is working on this with the family.  No events overnight, postop day 2 patient doing well, and high hopes.  Working with physical therapy.Patient did well having some issues with pain control. Postop day 3 and 4 pain improving. Working with PT, slow progression, Postop day 5 patient is ready for discharge to [facility].   Consults: None  Significant Diagnostic Studies: none  Treatments: IV hydration, antibiotics: Ancef, analgesia: acetaminophen, acetaminophen w/ codeine, and Tramadol, anticoagulation: Lovenox, therapies: PT, and surgery: Right TKA  Discharge Exam: Blood pressure (!) 127/44, pulse 66, temperature 97.8 F (36.6 C), resp. rate 18, height '5\' 8"'$  (1.727 m), weight 81.6 kg, SpO2 98 %. General appearance: alert, cooperative, appears stated age, and no distress Extremities: Right lower extremity in Ace bandage, calf is supple.  Limited range of motion of right knee.  Able to dorsiflex and plantarflex right ankle with no calf pain.  Negative Homans.  All other extremities normal, atraumatic,  no cyanosis or edema Pulses: 2+ and symmetric Skin: Skin color, texture, turgor normal. No rashes or lesions Neurologic: Grossly normal Incision/Wound: Dressing clean dry and intact  Disposition:    Allergies as of 04/15/2022       Reactions   Hydrocortisone Itching   Lisinopril    Other reaction(s): Lethargy (intolerance)   Phenergan [promethazine Hcl] Other (See Comments)   hallucinations   Statins Other (See Comments)   Severe myalgias   Trazodone And Nefazodone Cough   Prednisone Rash        Medication List     TAKE these medications    acetaminophen 500 MG tablet Commonly known as: TYLENOL Take 1 tablet (500 mg total) by mouth every 6 (six) hours as needed.   Allergy Relief 10 MG tablet Generic drug: loratadine TAKE (1) TABLET BY MOUTH ONCE DAILY.   ALPRAZolam 0.25 MG tablet Commonly known as: XANAX Take 1 tablet (0.25 mg total) by mouth 2 (two) times daily.   amLODipine 10 MG tablet Commonly known as: NORVASC Take 1 tablet (10 mg total) by mouth daily.   Anti-Diarrheal 2 MG tablet Generic drug: loperamide TAKE 1 TABLET BY MOUTH 3 TIMES A DAY AS NEEDED FOR DIARRHEA.   aspirin EC 81 MG tablet Take 1 tablet (81 mg total) by mouth in the morning and at bedtime. Swallow whole.   benzonatate 100 MG capsule Commonly known as: TESSALON Take 1 capsule (100 mg total) by mouth 3 (three) times daily as needed for cough. What changed: when to take this   PreserVision AREDS 2 Caps Take 1 capsule by mouth in the morning and at bedtime.   beta carotene  w/minerals tablet TAKE 1 TABLET BY MOUTH AT BEDTIME.   bisacodyl 5 MG EC tablet Commonly known as: DULCOLAX Take 1 tablet (5 mg total) by mouth daily as needed for moderate constipation.   Combivent Respimat 20-100 MCG/ACT Aers respimat Generic drug: Ipratropium-Albuterol Inhale 1 puff into the lungs every 6 (six) hours as needed for wheezing or shortness of breath.   dicyclomine 10 MG capsule Commonly  known as: Bentyl 1 bid prn abd cramps and loose stool   EasyMax Test test strip Generic drug: glucose blood CHECK BLOOD SUGAR ONCE DAILY.(CALL MD IF BS BELOW 60: OR IF BS ABOVE 400)   famotidine 20 MG tablet Commonly known as: PEPCID TAKE 1 TABLET BY MOUTH EACH MORNING.   Fish Oil 1000 MG Caps One capsule BID   gabapentin 100 MG capsule Commonly known as: NEURONTIN Take 3 capsules (300 mg total) by mouth 2 (two) times daily.   Geri-Tussin DM 10-100 MG/5ML liquid Generic drug: Dextromethorphan-guaiFENesin Take 5 mLs by mouth every 4 (four) hours as needed for cough.   GoodSense Hemorrhoidal 0.25-14-74.9 % rectal ointment Generic drug: phenylephrine-shark liver oil-mineral oil-petrolatum Place 1 Application rectally 3 (three) times daily as needed for irritation.   hydrALAZINE 25 MG tablet Commonly known as: APRESOLINE Take 1 tablet (25 mg total) by mouth 2 (two) times daily.   HYDROcodone-acetaminophen 5-325 MG tablet Commonly known as: NORCO/VICODIN Take 1-2 tablets by mouth every 4 (four) hours as needed for moderate pain or severe pain (pain score 4-6).   hydrOXYzine 25 MG tablet Commonly known as: ATARAX TAKE 1 TABLET BY MOUTH 3 TIMES A DAY AS NEEDED FOR ITCHING.   losartan 100 MG tablet Commonly known as: COZAAR Take 1 tablet daily   metFORMIN 500 MG tablet Commonly known as: GLUCOPHAGE Take one tablet '500mg'$  po in the morning and 1/2 tablet 250 mg po at supper   methocarbamol 500 MG tablet Commonly known as: ROBAXIN Take 1 tablet (500 mg total) by mouth every 6 (six) hours as needed for muscle spasms.   ondansetron 4 MG tablet Commonly known as: ZOFRAN Take 1 tablet (4 mg total) by mouth every 6 (six) hours as needed for nausea.   pantoprazole 20 MG tablet Commonly known as: PROTONIX TAKE (1) TABLET BY MOUTH ONCE DAILY.   Pro-Stat Liqd Take 30 mLs by mouth 3 (three) times daily. Sugar-free   Theratears 0.25 % Soln Generic drug: Carboxymethylcellulose  Sodium Apply 1 drop to eye in the morning and at bedtime.   traMADol 50 MG tablet Commonly known as: ULTRAM Take 1 tablet (50 mg total) by mouth every 6 (six) hours as needed.   Vitamin B-12 2500 MCG Subl TAKE 1/2 TABLET (1250MCG) BY MOUTH AT BEDTIME.   Vitamin D (Cholecalciferol) 10 MCG (400 UNIT) Tabs TAKE 1 TABLET BY MOUTH ONCE DAILY.         Signed: Drue Novel, PA-C [facility] with Dr. Theda Sers [phone] 04/11/2022, 6:45 AM

## 2022-04-11 NOTE — Progress Notes (Signed)
Subjective: 1 Day Post-Op Procedure(s) (LRB): TOTAL KNEE ARTHROPLASTY (Right) Patient reports pain as mild.   Patient slept well overnight Was able to get up with PT and walk to doorway Patient was able to void   Objective: Vital signs in last 24 hours: Temp:  [97.7 F (36.5 C)-98.6 F (37 C)] 97.8 F (36.6 C) (10/12 0529) Pulse Rate:  [61-90] 66 (10/12 0529) Resp:  [14-20] 18 (10/12 0529) BP: (126-162)/(44-81) 127/44 (10/12 0529) SpO2:  [91 %-100 %] 98 % (10/12 0529) Weight:  [81.6 kg] 81.6 kg (10/11 1313)  Intake/Output from previous day: 10/11 0701 - 10/12 0700 In: 2816.5 [I.V.:2466.5; IV Piggyback:350] Out: 850 [Urine:800; Blood:50] Intake/Output this shift: Total I/O In: 782.5 [I.V.:682.5; IV Piggyback:100] Out: 200 [Urine:200]  Recent Labs    04/10/22 1153  HGB 11.2*   Recent Labs    04/10/22 1153  WBC 8.4  RBC 4.08  HCT 36.3  PLT 266   Recent Labs    04/10/22 1153  CREATININE 0.92   No results for input(s): "LABPT", "INR" in the last 72 hours.  Neurovascular intact Sensation intact distally Intact pulses distally Dorsiflexion/Plantar flexion intact Incision: dressing C/D/I Compartment soft   Assessment/Plan: 1 Day Post-Op Procedure(s) (LRB): TOTAL KNEE ARTHROPLASTY (Right) Up with therapy, WBAT Discharge to SNF patient already has a placement [facility] Depending on how the patient does today with therapy hopefully for discharge tomorrow. Medications of already in place in the patient's chart DVT prophylaxis will be aspirin 81 mg twice daily for DVT prevention, Lovenox in house     Patient's anticipated LOS is less than 2 midnights, meeting these requirements: - Younger than 62 - Lives within 1 hour of care - Has a competent adult at home to recover with post-op recover - NO history of  - Chronic pain requiring opiods  - Diabetes  - Coronary Artery Disease  - Heart failure  - Heart attack  - Stroke  - DVT/VTE  - Cardiac  arrhythmia  - Respiratory Failure/COPD  - Renal failure  - Anemia  - Advanced Liver disease     Drue Novel, PA-C [facility] With Dr. Theda Sers [phone] 04/11/2022, 6:35 AM

## 2022-04-11 NOTE — Progress Notes (Signed)
Physical Therapy Treatment Patient Details Name: [name] MRN: [id number] DOB: [birth year] Today's Date: 04/11/2022   History of Present Illness Pt is an 88yo female presenting s/p R-TKA on 04/10/22. PMH: cognitive dysfunction, DM with peripheral neuropathy, HLD, HTN, PNA, GERD,    PT Comments    Pt seen POD1 received with NT transferring pt back to bed after having utilized [facility]; completed handoff and took responsibility of pt. Pt required min guard for bed mobility, transfers, and ambulation in hallway with RW. Ambulation increased pt's pain report from 7 to 10/10, informed RN that pt is requesting pain medications. Pt is mobilizing well and pain/fatigue are the primary barriers to discharge from a mobility standpoint. We will attempt second session today to review HEP. Will continue to follow acutely.     Recommendations for follow up therapy are one component of a multi-disciplinary discharge planning process, led by the attending physician.  Recommendations may be updated based on patient status, additional functional criteria and insurance authorization.  Follow Up Recommendations  Skilled nursing-short term rehab (<3 hours/day) Can patient physically be transported by private vehicle: Yes   Assistance Recommended at Discharge Frequent or constant Supervision/Assistance  Patient can return home with the following A little help with walking and/or transfers;A little help with bathing/dressing/bathroom;Assistance with cooking/housework;Assist for transportation;Help with stairs or ramp for entrance   Equipment Recommendations  None recommended by PT    Recommendations for Other Services       Precautions / Restrictions Precautions Precautions: Fall;Knee Precaution Booklet Issued: No Precaution Comments: no pillow under the knee Restrictions Weight Bearing Restrictions: No RLE Weight Bearing: Weight bearing as tolerated Other Position/Activity Restrictions: wbat      Mobility  Bed Mobility Overal bed mobility: Needs Assistance Bed Mobility: Supine to Sit     Supine to sit: Min guard     General bed mobility comments: Min guard for safety, pt able to use gait belt to self-assist RLE off bed.    Transfers Overall transfer level: Needs assistance Equipment used: Rolling walker (2 wheels) Transfers: Sit to/from Stand Sit to Stand: Min guard           General transfer comment: For safety only, VCs for hand placement    Ambulation/Gait Ambulation/Gait assistance: Min guard Gait Distance (Feet): 50 Feet Assistive device: Rolling walker (2 wheels) Gait Pattern/deviations: Step-to pattern Gait velocity: decreased     General Gait Details: Pt ambulated with RW and min guard, no physical assist required or overt LOB noted   Stairs             Wheelchair Mobility    Modified Rankin (Stroke Patients Only)       Balance Overall balance assessment: Needs assistance Sitting-balance support: Feet supported, No upper extremity supported Sitting balance-Leahy Scale: Good     Standing balance support: Reliant on assistive device for balance, During functional activity, Bilateral upper extremity supported Standing balance-Leahy Scale: Poor                              Cognition Arousal/Alertness: Awake/alert Behavior During Therapy: WFL for tasks assessed/performed Overall Cognitive Status: Within Functional Limits for tasks assessed                                          Exercises Total Joint Exercises Ankle Circles/Pumps: AROM, Both, 20  reps    General Comments        Pertinent Vitals/Pain Pain Assessment Pain Assessment: 0-10 Pain Score: 10-Worst pain ever Pain Location: right knee Pain Descriptors / Indicators: Operative site guarding Pain Intervention(s): Limited activity within patient's tolerance, Monitored during session, Repositioned, Ice applied, Patient requesting pain  meds-RN notified    Home Living                          Prior Function            PT Goals (current goals can now be found in the care plan section) Acute Rehab PT Goals Patient Stated Goal: walk 1 mile PT Goal Formulation: With patient Time For Goal Achievement: 04/17/22 Potential to Achieve Goals: Good Progress towards PT goals: Progressing toward goals    Frequency    7X/week      PT Plan Current plan remains appropriate    Co-evaluation              AM-PAC PT "6 Clicks" Mobility   Outcome Measure  Help needed turning from your back to your side while in a flat bed without using bedrails?: None Help needed moving from lying on your back to sitting on the side of a flat bed without using bedrails?: A Little Help needed moving to and from a bed to a chair (including a wheelchair)?: A Little Help needed standing up from a chair using your arms (e.g., wheelchair or bedside chair)?: A Little Help needed to walk in hospital room?: A Little Help needed climbing 3-5 steps with a railing? : A Lot 6 Click Score: 18    End of Session Equipment Utilized During Treatment: Gait belt Activity Tolerance: Patient limited by pain Patient left: in chair;with call bell/phone within reach;with chair alarm set Nurse Communication: Mobility status PT Visit Diagnosis: Pain;Difficulty in walking, not elsewhere classified (R26.2) Pain - Right/Left: Right Pain - part of body: Knee     Time: 8264-1583 PT Time Calculation (min) (ACUTE ONLY): 13 min  Charges:  $Gait Training: 8-22 mins                     Coolidge Breeze, PT, DPT [facility] Department Office: [phone] Weekend pager: [phone]   Coolidge Breeze 04/11/2022, 1:19 PM

## 2022-04-11 NOTE — Progress Notes (Addendum)
Physical Therapy Treatment Patient Details Name: [name] MRN: [id number] DOB: 06-09-1932 Today's Date: 04/11/2022   History of Present Illness Pt is an 88yo female presenting s/p R-TKA on 04/10/22. PMH: cognitive dysfunction, DM with peripheral neuropathy, HLD, HTN, PNA, GERD,    PT Comments    Pt seen for second visit POD1, received supine in bed somewhat tearful about the insurance situation but remains hopeful. Focus of session was supine HEP, provided handout and answered any questions. Pt completed supine portion with use of gait belt to self-assist with several of the exercises; split exercises into two bouts of five secondary to pain report. Pt required minimal cuing and was able to complete all exercises successfully. Pt is progressing well, high level of pain is the primary mobility barrier. Pt lives at [facility] with possibility of 3-day-week HHPT and discussed possibility of returning there vs ST SNF-level therapies but pt does not feel comfortable as she would not have any physical or mobility assistance and does not have a call bell available in her room; would prefer to stay at [facility] for short period to improve her mobility. We will continue to follow acutely.     Recommendations for follow up therapy are one component of a multi-disciplinary discharge planning process, led by the attending physician.  Recommendations may be updated based on patient status, additional functional criteria and insurance authorization.  Follow Up Recommendations  Skilled nursing-short term rehab (<3 hours/day) Can patient physically be transported by private vehicle: Yes   Assistance Recommended at Discharge Frequent or constant Supervision/Assistance  Patient can return home with the following A little help with walking and/or transfers;A little help with bathing/dressing/bathroom;Assistance with cooking/housework;Assist for transportation;Help with stairs or ramp for entrance    Equipment Recommendations  None recommended by PT    Recommendations for Other Services       Precautions / Restrictions Precautions Precautions: Fall;Knee Precaution Booklet Issued: No Precaution Comments: no pillow under the knee Restrictions Weight Bearing Restrictions: No RLE Weight Bearing: Weight bearing as tolerated Other Position/Activity Restrictions: wbat     Mobility              Wheelchair Mobility    Modified Rankin (Stroke Patients Only)       Balance Overall balance assessment: Needs assistance Sitting-balance support: Feet supported, No upper extremity supported Sitting balance-Leahy Scale: Good     Standing balance support: Reliant on assistive device for balance, During functional activity, Bilateral upper extremity supported Standing balance-Leahy Scale: Poor                              Cognition Arousal/Alertness: Awake/alert Behavior During Therapy: WFL for tasks assessed/performed Overall Cognitive Status: Within Functional Limits for tasks assessed                                General Comments: Pt tearful about discharge destination and possible insurance denial        Exercises Total Joint Exercises Ankle Circles/Pumps: AROM, Both, 20 reps Quad Sets: AROM, 10 reps, Both, Supine Short Arc Quad: AROM, 10 reps, Supine, Right Heel Slides: AAROM, Right, Supine Hip ABduction/ADduction: AROM, 10 reps, Supine, Right Straight Leg Raises: AAROM, Right, 10 reps, Supine    General Comments        Pertinent Vitals/Pain Pain Assessment Pain Assessment: 0-10 Pain Score: 7  Pain Location: right knee Pain Descriptors /  Indicators: Operative site guarding Pain Intervention(s): Limited activity within patient's tolerance, Monitored during session, Repositioned, Ice applied    Home Living                          Prior Function            PT Goals (current goals can now be found in the care  plan section) Acute Rehab PT Goals Patient Stated Goal: walk 1 mile PT Goal Formulation: With patient Time For Goal Achievement: 04/17/22 Potential to Achieve Goals: Good Progress towards PT goals: Progressing toward goals    Frequency    7X/week      PT Plan Current plan remains appropriate    Co-evaluation              AM-PAC PT "6 Clicks" Mobility   Outcome Measure  Help needed turning from your back to your side while in a flat bed without using bedrails?: A Little Help needed moving from lying on your back to sitting on the side of a flat bed without using bedrails?: A Little Help needed moving to and from a bed to a chair (including a wheelchair)?: A Little Help needed standing up from a chair using your arms (e.g., wheelchair or bedside chair)?: A Little Help needed to walk in hospital room?: A Little Help needed climbing 3-5 steps with a railing? : A Lot 6 Click Score: 17    End of Session   Activity Tolerance: Patient limited by pain Patient left: with call bell/phone within reach;in bed;with bed alarm set;with SCD's reapplied Nurse Communication: Mobility status PT Visit Diagnosis: Pain;Difficulty in walking, not elsewhere classified (R26.2) Pain - Right/Left: Right Pain - part of body: Knee     Time: 1625-1700 PT Time Calculation (min) (ACUTE ONLY): 35 min  Charges:  $Therapeutic Exercise: 23-37 mins                     Coolidge Breeze, PT, DPT [facility] Department Office: [phone] Weekend pager: [phone]   Coolidge Breeze 04/11/2022, 5:07 PM

## 2022-04-11 NOTE — TOC Initial Note (Signed)
Transition of Care [facility]) - Initial/Assessment Note    Patient Details  Name: [name] MRN: [id number] Date of Birth: 1932-05-23  Transition of Care [facility]) CM/SW Contact:    Linda Pall, LCSW Phone Number: 04/11/2022, 3:30 PM  Clinical Narrative:                 Have spoken today with pt and son as well as Scientist, physiological, [name], at [facility] (where pt resides) and with Admissions director at [facility] to discuss dc plans. Pt and son report that, prior to this surgery, they had made plans for pt to discharge to [facility] for rehab.  Administrator at [facility], also, reports that she was part of that planning.  Unfortunately, I have explained to all involved that pt is here under "Observation" status and her Traditional Medicare coverage requires a 3 Midnight stay as "Inpatient" in order to cover SNF.  I have spoken with UR dept today and they have reviewed her case and she remains under Observation/ does not meet criteria to change to IP.  Pt and son very frustrated with the situation but we have attempted to make alternate plans which include either pt privately paying for SNF vs. Progressing in mobility enough to just return directly to the ALF.  Will monitor progress with therapies tomorrow and discuss again with all involved.  Please note, I have been informed by [facility] administrator that, if direct return to ALF is recommended, they will not do weekend admission and they will need to evaluate/ approve her for return.  Expected Discharge Plan: [place] (vs. return to ALF) Barriers to Discharge: Continued Medical Work up   Patient Goals and CMS Choice Patient states their goals for this hospitalization and ongoing recovery are:: rehab and then return to ALF      Expected Discharge Plan and Services Expected Discharge Plan: [place] (vs. return to ALF) In-house Referral: Clinical Social Work   Post Acute Care Choice: [place] Living arrangements for the past 2 months: [place] ([place])                                      Prior Living Arrangements/Services Living arrangements for the past 2 months: [place] ([place]) Lives with:: Facility Resident Patient language and need for interpreter reviewed:: Yes Do you feel safe going back to the place where you live?: Yes      Need for Family Participation in Patient Care: No (Comment) Care giver support system in place?: Yes (comment)   Criminal Activity/Legal Involvement Pertinent to Current Situation/Hospitalization: No - Comment as needed  Activities of Daily Living Home Assistive Devices/Equipment: Environmental consultant (specify type), CBG Meter, Grab bars in shower, Grab bars around toilet, Raised toilet seat with rails ADL Screening (condition at time of admission) Patient's cognitive ability adequate to safely complete daily activities?: Yes Is the patient deaf or have difficulty hearing?: No Does the patient have difficulty seeing, even when wearing glasses/contacts?: No Does the patient have difficulty concentrating, remembering, or making decisions?: No Patient able to express need for assistance with ADLs?: Yes Does the patient have difficulty dressing or bathing?: No Independently performs ADLs?: Yes (appropriate for developmental age) Does the patient have difficulty walking or climbing stairs?: Yes Weakness of Legs: Right Weakness of Arms/Hands: None  Permission Sought/Granted Permission sought to share information with :  Facility Sport and exercise psychologist, Family Supports Permission granted to share information with : Yes, Verbal Permission Granted  Share Information with NAME: [name]     Permission granted to share info w Relationship: son  Permission granted to share info w Contact Information: [phone]  Emotional Assessment Appearance:: Appears stated  age Attitude/Demeanor/Rapport: Gracious Affect (typically observed): Accepting Orientation: : Oriented to Self, Oriented to Place, Oriented to  Time, Oriented to Situation Alcohol / Substance Use: Not Applicable Psych Involvement: No (comment)  Admission diagnosis:  Osteoarthritis of right knee [M17.11] Patient Active Problem List   Diagnosis Date Noted   Hypertension associated with type 2 diabetes mellitus ([place]) 02/25/2022   Aortic atherosclerosis ([place]) 02/25/2022   Chronic non-seasonal allergic rhinitis 02/25/2022   Diabetic peripheral neuropathy associated with type 2 diabetes mellitus ([place]) 02/25/2022   Severe protein-calorie malnutrition ([place]) 02/25/2022   Chronic anxiety 02/25/2022   SIADH (syndrome of inappropriate ADH production) ([place]) 02/25/2022   Pneumonia 02/17/2022   GERD (gastroesophageal reflux disease) 02/17/2022   Acute respiratory failure with hypoxia ([place]) 02/26/2021   Myalgia due to statin 02/15/2020   Cognitive dysfunction 03/04/2019   Generalized anxiety disorder 06/05/2018   Floaters in visual field, bilateral 03/23/2018   Living in assisted living 03/23/2018   Hyponatremia 03/08/2018   C. difficile diarrhea 03/03/2018   Osteoarthritis of right knee 12/02/2017   Closed fracture of left orbital floor with routine healing 01/17/2017   Hyperlipidemia associated with type 2 diabetes mellitus ([place]) 10/02/2016   Pedal edema 10/02/2016   Insomnia 10/02/2016   SBO (small bowel obstruction) ([place]) 07/02/2016   Dyspnea on exertion 02/24/2016   Other fatigue    Leukocytosis 12/16/2014   Essential hypertension 12/15/2014   Type 2 diabetes mellitus with atherosclerosis of aorta ([place]) 02/15/2011   PCP:  Linda Drown, MD Pharmacy:   [facility], [place] [address] [place] [address] Phone: [phone] Fax: [place], [place] [address] [place]  [place] [address] Phone: [phone] Fax: [phone]     Social Determinants of Health (SDOH) Interventions    Readmission Risk Interventions     No data to display

## 2022-04-12 DIAGNOSIS — E1143 Type 2 diabetes mellitus with diabetic autonomic (poly)neuropathy: Secondary | ICD-10-CM | POA: Diagnosis not present

## 2022-04-12 DIAGNOSIS — Z888 Allergy status to other drugs, medicaments and biological substances status: Secondary | ICD-10-CM | POA: Diagnosis not present

## 2022-04-12 DIAGNOSIS — Z471 Aftercare following joint replacement surgery: Secondary | ICD-10-CM | POA: Diagnosis not present

## 2022-04-12 DIAGNOSIS — Z741 Need for assistance with personal care: Secondary | ICD-10-CM | POA: Diagnosis not present

## 2022-04-12 DIAGNOSIS — Z79899 Other long term (current) drug therapy: Secondary | ICD-10-CM | POA: Diagnosis not present

## 2022-04-12 DIAGNOSIS — Z8249 Family history of ischemic heart disease and other diseases of the circulatory system: Secondary | ICD-10-CM | POA: Diagnosis not present

## 2022-04-12 DIAGNOSIS — F0390 Unspecified dementia without behavioral disturbance: Secondary | ICD-10-CM | POA: Diagnosis not present

## 2022-04-12 DIAGNOSIS — E785 Hyperlipidemia, unspecified: Secondary | ICD-10-CM | POA: Diagnosis present

## 2022-04-12 DIAGNOSIS — E1159 Type 2 diabetes mellitus with other circulatory complications: Secondary | ICD-10-CM | POA: Diagnosis not present

## 2022-04-12 DIAGNOSIS — Z833 Family history of diabetes mellitus: Secondary | ICD-10-CM | POA: Diagnosis not present

## 2022-04-12 DIAGNOSIS — Z9049 Acquired absence of other specified parts of digestive tract: Secondary | ICD-10-CM | POA: Diagnosis not present

## 2022-04-12 DIAGNOSIS — M1711 Unilateral primary osteoarthritis, right knee: Secondary | ICD-10-CM | POA: Diagnosis present

## 2022-04-12 DIAGNOSIS — Z96651 Presence of right artificial knee joint: Secondary | ICD-10-CM

## 2022-04-12 DIAGNOSIS — G4700 Insomnia, unspecified: Secondary | ICD-10-CM | POA: Diagnosis present

## 2022-04-12 DIAGNOSIS — F411 Generalized anxiety disorder: Secondary | ICD-10-CM | POA: Diagnosis present

## 2022-04-12 DIAGNOSIS — R41841 Cognitive communication deficit: Secondary | ICD-10-CM | POA: Diagnosis not present

## 2022-04-12 DIAGNOSIS — M159 Polyosteoarthritis, unspecified: Secondary | ICD-10-CM | POA: Diagnosis not present

## 2022-04-12 DIAGNOSIS — R262 Difficulty in walking, not elsewhere classified: Secondary | ICD-10-CM | POA: Diagnosis not present

## 2022-04-12 DIAGNOSIS — K219 Gastro-esophageal reflux disease without esophagitis: Secondary | ICD-10-CM | POA: Diagnosis present

## 2022-04-12 DIAGNOSIS — K5900 Constipation, unspecified: Secondary | ICD-10-CM | POA: Diagnosis present

## 2022-04-12 DIAGNOSIS — I152 Hypertension secondary to endocrine disorders: Secondary | ICD-10-CM | POA: Diagnosis present

## 2022-04-12 DIAGNOSIS — I700 Atherosclerosis of aorta: Secondary | ICD-10-CM | POA: Diagnosis present

## 2022-04-12 DIAGNOSIS — E1142 Type 2 diabetes mellitus with diabetic polyneuropathy: Secondary | ICD-10-CM | POA: Diagnosis present

## 2022-04-12 DIAGNOSIS — E1169 Type 2 diabetes mellitus with other specified complication: Secondary | ICD-10-CM | POA: Diagnosis present

## 2022-04-12 DIAGNOSIS — Z751 Person awaiting admission to adequate facility elsewhere: Secondary | ICD-10-CM | POA: Diagnosis not present

## 2022-04-12 DIAGNOSIS — M6281 Muscle weakness (generalized): Secondary | ICD-10-CM | POA: Diagnosis not present

## 2022-04-12 DIAGNOSIS — K649 Unspecified hemorrhoids: Secondary | ICD-10-CM | POA: Diagnosis not present

## 2022-04-12 DIAGNOSIS — Z1152 Encounter for screening for COVID-19: Secondary | ICD-10-CM | POA: Diagnosis not present

## 2022-04-12 LAB — GLUCOSE, CAPILLARY: Glucose-Capillary: 183 mg/dL — ABNORMAL HIGH (ref 70–99)

## 2022-04-12 MED ORDER — BUPIVACAINE-EPINEPHRINE (PF) 0.5% -1:200000 IJ SOLN: INTRAMUSCULAR | Status: DC | PRN

## 2022-04-12 NOTE — Plan of Care (Signed)
Problem: Health Behavior/Discharge Planning: Goal: Ability to manage health-related needs will improve Outcome: Progressing   Problem: Clinical Measurements: Goal: Ability to maintain clinical measurements within normal limits will improve Outcome: Progressing   Problem: Activity: Goal: Risk for activity intolerance will decrease Outcome: Progressing   Problem: Pain Managment: Goal: General experience of comfort will improve Outcome: Progressing   Ivan Anchors, RN 04/12/22 8:53 AM

## 2022-04-12 NOTE — Progress Notes (Signed)
Subjective: 2 Days Post-Op Procedure(s) (LRB): TOTAL KNEE ARTHROPLASTY (Right) Patient reports pain as mild.   Patient worked with physical therapy, is progressing well Unfortunately in a bit of a difficult situation with postoperative plans.  She was supposed to be going to [facility] postoperatively but unfortunately insurance wise she may not be able to go.  She is unable to go back to her assisted living facility [place] due to her functional level at this time.  Objective: Vital signs in last 24 hours: Temp:  [98.1 F (36.7 C)-98.6 F (37 C)] 98.6 F (37 C) (10/13 0441) Pulse Rate:  [67-84] 78 (10/13 0441) Resp:  [18-20] 20 (10/13 0441) BP: (141-165)/(44-67) 156/48 (10/13 0441) SpO2:  [97 %-99 %] 97 % (10/13 0441)  Intake/Output from previous day: 10/12 0701 - 10/13 0700 In: 1634.2 [P.O.:1320; I.V.:314.2] Out: 1100 [Urine:1100] Intake/Output this shift: No intake/output data recorded.  Recent Labs    04/10/22 1153  HGB 11.2*   Recent Labs    04/10/22 1153  WBC 8.4  RBC 4.08  HCT 36.3  PLT 266   Recent Labs    04/10/22 1153  CREATININE 0.92   No results for input(s): "LABPT", "INR" in the last 72 hours.  Neurologically intact Neurovascular intact Sensation intact distally Intact pulses distally Dorsiflexion/Plantar flexion intact Incision: dressing C/D/I Compartment soft   Assessment/Plan: 2 Days Post-Op Procedure(s) (LRB): TOTAL KNEE ARTHROPLASTY (Right) Up with therapy continue working with physical therapy while she is in the hospital DVT prophylaxis is Lovenox injections, once discharge aspirin 81 mg twice daily for DVT prevention Unfortunately right now patient is just on kind of a holding game.  Patient's family, patient as well as nurse case manager here and administration at [employer] as well as [employer] are working together to figure out what is the best option for the patient. We will support whatever decision the patient and everybody  makes.   She is progressing well with physical therapy here and I would like for her to continue working with them until a decision is made as to what she is going to be doing postoperatively     Patient's anticipated LOS is less than 2 midnights, meeting these requirements: - Younger than 17 - Lives within 1 hour of care - Has a competent adult at home to recover with post-op recover - NO history of  - Chronic pain requiring opiods  - Diabetes  - Coronary Artery Disease  - Heart failure  - Heart attack  - Stroke  - DVT/VTE  - Cardiac arrhythmia  - Respiratory Failure/COPD  - Renal failure  - Anemia  - Advanced Liver disease     Drue Novel, PA-C [facility] with Dr. Theda Sers [phone] 04/12/2022, 7:08 AM

## 2022-04-12 NOTE — NC FL2 (Signed)
[place] LEVEL OF CARE SCREENING TOOL     IDENTIFICATION  Patient Name: PAETYN PIETRZAK Birthdate: 1932-04-26 Sex: female Admission Date (Current Location): 04/10/2022  [facility] [place] Number:  Herbalist and Address:  [place],  [place], [place]      Provider Number: [id number]  Attending Physician Name and Address:  Sydnee Cabal, MD  Relative Name and Phone Number:  son, Jessic Standifer @ [phone]    Current Level of Care: Hospital Recommended Level of Care: [place] Prior Approval Number:    Date Approved/Denied:   PASRR Number: [id number]  Discharge Plan: SNF    Current Diagnoses: Patient Active Problem List   Diagnosis Date Noted   S/P total knee arthroplasty, right 04/12/2022   Hypertension associated with type 2 diabetes mellitus ([place]) 02/25/2022   Aortic atherosclerosis ([place]) 02/25/2022   Chronic non-seasonal allergic rhinitis 02/25/2022   Diabetic peripheral neuropathy associated with type 2 diabetes mellitus ([place]) 02/25/2022   Severe protein-calorie malnutrition ([place]) 02/25/2022   Chronic anxiety 02/25/2022   SIADH (syndrome of inappropriate ADH production) ([place]) 02/25/2022   Pneumonia 02/17/2022   GERD (gastroesophageal reflux disease) 02/17/2022   Acute respiratory failure with hypoxia ([place]) 02/26/2021   Myalgia due to statin 02/15/2020   Cognitive dysfunction 03/04/2019   Generalized anxiety disorder 06/05/2018   Floaters in visual field, bilateral 03/23/2018   Living in assisted living 03/23/2018   Hyponatremia 03/08/2018   C. difficile diarrhea 03/03/2018   Osteoarthritis of right knee 12/02/2017   Closed fracture of left orbital floor with routine healing 01/17/2017   Hyperlipidemia associated with type 2 diabetes mellitus ([place]) 10/02/2016   Pedal edema 10/02/2016   Insomnia 10/02/2016   SBO (small bowel obstruction) ([place]) 07/02/2016   Dyspnea on exertion  02/24/2016   Other fatigue    Leukocytosis 12/16/2014   Essential hypertension 12/15/2014   Type 2 diabetes mellitus with atherosclerosis of aorta ([place]) 02/15/2011    Orientation RESPIRATION BLADDER Height & Weight     Self, Time, Situation, Place  Normal Continent Weight: 179 lb 14.3 oz (81.6 kg) Height:  '5\' 8"'$  (172.7 cm)  BEHAVIORAL SYMPTOMS/MOOD NEUROLOGICAL BOWEL NUTRITION STATUS      Continent    AMBULATORY STATUS COMMUNICATION OF NEEDS Skin   Limited Assist Verbally Other (Comment) (surgical incision only)                       Personal Care Assistance Level of Assistance  Bathing, Dressing Bathing Assistance: Limited assistance   Dressing Assistance: Limited assistance     Functional Limitations Info             [place]  PT (By licensed PT), OT (By licensed OT)     PT Frequency: 5x/wk OT Frequency: 5x/wk            Contractures Contractures Info: Not present    Additional Factors Info  Code Status, Allergies Code Status Info: Full Allergies Info: Hydrocortisone, Lisinopril, Phenergan (Promethazine Hcl), Statins, Trazodone And Nefazodone, Prednisone           Current Medications (04/12/2022):  This is the current hospital active medication list Current Facility-Administered Medications  Medication Dose Route Frequency Provider Last Rate Last Admin   0.9 %  sodium chloride infusion   Intravenous Continuous Haus, Leeanne R, PA   Stopped at 04/11/22 1204   acetaminophen (TYLENOL) tablet 325-650 mg  325-650 mg Oral Q6H PRN Elizabeth Sauer  R, PA       ALPRAZolam (XANAX) tablet 0.25 mg  0.25 mg Oral BID Haus, Leeanne R, PA   0.25 mg at 04/12/22 1000   amLODipine (NORVASC) tablet 10 mg  10 mg Oral Daily Haus, Leeanne R, PA   10 mg at 04/12/22 1001   benzonatate (TESSALON) capsule 100 mg  100 mg Oral Q8H PRN Haus, Leeanne R, PA       bisacodyl (DULCOLAX) EC tablet 5 mg  5 mg Oral Daily PRN Haus, Leeanne R, PA       diphenhydrAMINE  (BENADRYL) 12.5 MG/5ML elixir 12.5-25 mg  12.5-25 mg Oral Q4H PRN Haus, Leeanne R, PA       enoxaparin (LOVENOX) injection 40 mg  40 mg Subcutaneous Q24H Haus, Leeanne R, PA   40 mg at 04/12/22 0805   famotidine (PEPCID) tablet 20 mg  20 mg Oral Daily Haus, Leeanne R, PA   20 mg at 04/12/22 1001   gabapentin (NEURONTIN) capsule 300 mg  300 mg Oral BID Haus, Leeanne R, PA   300 mg at 04/12/22 1000   hydrALAZINE (APRESOLINE) tablet 25 mg  25 mg Oral BID Haus, Leeanne R, PA   25 mg at 04/12/22 1000   HYDROcodone-acetaminophen (NORCO) 7.5-325 MG per tablet 1-2 tablet  1-2 tablet Oral Q4H PRN Elizabeth Sauer R, PA   2 tablet at 04/11/22 2249   HYDROcodone-acetaminophen (NORCO/VICODIN) 5-325 MG per tablet 1-2 tablet  1-2 tablet Oral Q4H PRN Elizabeth Sauer R, PA   2 tablet at 04/12/22 1000   loratadine (CLARITIN) tablet 10 mg  10 mg Oral Daily PRN Haus, Margarita Mail R, PA       losartan (COZAAR) tablet 100 mg  100 mg Oral Daily Haus, Leeanne R, PA   100 mg at 04/12/22 1000   magnesium citrate solution 1 Bottle  1 Bottle Oral Once PRN Elizabeth Sauer R, PA       metFORMIN (GLUCOPHAGE) tablet 500 mg  500 mg Oral Q breakfast Haus, Leeanne R, PA   500 mg at 04/12/22 0805   methocarbamol (ROBAXIN) tablet 500 mg  500 mg Oral Q6H PRN Elizabeth Sauer R, PA   500 mg at 04/11/22 2249   Or   methocarbamol (ROBAXIN) 500 mg in dextrose 5 % 50 mL IVPB  500 mg Intravenous Q6H PRN Haus, Leeanne R, PA       morphine (PF) 2 MG/ML injection 0.5-1 mg  0.5-1 mg Intravenous Q2H PRN Haus, Leeanne R, PA   1 mg at 04/11/22 1152   multivitamin (PROSIGHT) tablet 1 tablet  1 tablet Oral QHS Haus, Leeanne R, PA   1 tablet at 04/11/22 2249   ondansetron (ZOFRAN) tablet 4 mg  4 mg Oral Q6H PRN Haus, Leeanne R, PA       Or   ondansetron (ZOFRAN) injection 4 mg  4 mg Intravenous Q6H PRN Haus, Leeanne R, PA   4 mg at 04/11/22 0815   Oral care mouth rinse  15 mL Mouth Rinse PRN Sydnee Cabal, MD       pantoprazole (PROTONIX) EC tablet 20 mg  20 mg  Oral Daily Haus, Leeanne R, PA   20 mg at 04/12/22 1001   polyvinyl alcohol (LIQUIFILM TEARS) 1.4 % ophthalmic solution 1 drop  1 drop Both Eyes Daily PRN Haus, Leeanne R, PA       senna-docusate (Senokot-S) tablet 1 tablet  1 tablet Oral QHS PRN Elizabeth Sauer R, PA   1 tablet at 04/11/22 2250  traMADol (ULTRAM) tablet 50 mg  50 mg Oral Q6H Haus, Leeanne R, PA   50 mg at 04/12/22 0636     Discharge Medications: Please see discharge summary for a list of discharge medications.  Relevant Imaging Results:  Relevant Lab Results:   Additional Information SSN: [id number]  Pedro Whiters, LCSW

## 2022-04-12 NOTE — Progress Notes (Signed)
Physical Therapy Treatment Patient Details Name: [name] MRN: [id number] DOB: 10/22/1931 Today's Date: 04/12/2022   History of Present Illness Pt is an 88yo female presenting s/p R-TKA on 04/10/22. PMH: cognitive dysfunction, DM with peripheral neuropathy, HLD, HTN, PNA, GERD,    PT Comments    Pt remained in recliner until therapist returned this afternoon and requiring min assist for transfers and initiating ambulation (for stability).  Pt assisted back to bed end of session.  Pt in better spirits this afternoon since hearing she can d/c to [facility] for rehab.    Recommendations for follow up therapy are one component of a multi-disciplinary discharge planning process, led by the attending physician.  Recommendations may be updated based on patient status, additional functional criteria and insurance authorization.  Follow Up Recommendations  Skilled nursing-short term rehab (<3 hours/day) Can patient physically be transported by private vehicle: Yes   Assistance Recommended at Discharge Frequent or constant Supervision/Assistance  Patient can return home with the following A little help with walking and/or transfers;A little help with bathing/dressing/bathroom;Assistance with cooking/housework;Assist for transportation;Help with stairs or ramp for entrance   Equipment Recommendations  None recommended by PT    Recommendations for Other Services       Precautions / Restrictions Precautions Precautions: Fall;Knee Restrictions RLE Weight Bearing: Weight bearing as tolerated     Mobility  Bed Mobility Overal bed mobility: Needs Assistance Bed Mobility: Sit to Supine     Supine to sit: Min guard Sit to supine: Min assist   General bed mobility comments: assist for R LE for pain control    Transfers Overall transfer level: Needs assistance Equipment used: Rolling walker (2 wheels) Transfers: Sit to/from Stand Sit to Stand: Min assist           General  transfer comment: pt sitting in recliner since therapist left earlier, requiring min assist to rise and especially stabilize, cues for hand placement, positioning and safe technique    Ambulation/Gait Ambulation/Gait assistance: Min guard, Min assist Gait Distance (Feet): 70 Feet Assistive device: Rolling walker (2 wheels) Gait Pattern/deviations: Step-to pattern, Antalgic, Decreased stance time - right Gait velocity: decreased     General Gait Details: verbal cues for sequence, RW positioning, step length; also for right foot in more neutral position due to observed toe out;  pt reports UE fatigue   Stairs             Wheelchair Mobility    Modified Rankin (Stroke Patients Only)       Balance                                            Cognition Arousal/Alertness: Awake/alert Behavior During Therapy: WFL for tasks assessed/performed Overall Cognitive Status: Within Functional Limits for tasks assessed                                          Exercises     General Comments        Pertinent Vitals/Pain Pain Assessment Pain Assessment: 0-10 Pain Score: 6  Pain Location: right knee Pain Descriptors / Indicators: Aching, Sore Pain Intervention(s): Monitored during session, Repositioned    Home Living  Prior Function            PT Goals (current goals can now be found in the care plan section) Progress towards PT goals: Progressing toward goals    Frequency    7X/week      PT Plan Current plan remains appropriate    Co-evaluation              AM-PAC PT "6 Clicks" Mobility   Outcome Measure  Help needed turning from your back to your side while in a flat bed without using bedrails?: A Little Help needed moving from lying on your back to sitting on the side of a flat bed without using bedrails?: A Little Help needed moving to and from a bed to a chair (including a  wheelchair)?: A Little Help needed standing up from a chair using your arms (e.g., wheelchair or bedside chair)?: A Little Help needed to walk in hospital room?: A Little Help needed climbing 3-5 steps with a railing? : A Lot 6 Click Score: 17    End of Session Equipment Utilized During Treatment: Gait belt Activity Tolerance: Patient tolerated treatment well Patient left: with call bell/phone within reach;in chair;with chair alarm set Nurse Communication: Mobility status PT Visit Diagnosis: Difficulty in walking, not elsewhere classified (R26.2)     Time: 4680-3212 PT Time Calculation (min) (ACUTE ONLY): 16 min  Charges:  $Gait Training: 8-22 mins                    Jannette Spanner PT, DPT Physical Therapist Acute Rehabilitation Services Preferred contact method: Secure Chat Weekend Pager Only: [phone] Office: [phone]    Myrtis Hopping [place] 04/12/2022, 3:51 PM

## 2022-04-12 NOTE — Progress Notes (Signed)
Physical Therapy Treatment Patient Details Name: [name] MRN: [id number] DOB: 1931-07-23 Today's Date: 04/12/2022   History of Present Illness Pt is an 88yo female presenting s/p R-TKA on 04/10/22. PMH: cognitive dysfunction, DM with peripheral neuropathy, HLD, HTN, PNA, GERD,    PT Comments    Pt ambulated in hallway and performed LE exercises.  Pt reports she is uncertain of d/c plan at this time.    Recommendations for follow up therapy are one component of a multi-disciplinary discharge planning process, led by the attending physician.  Recommendations may be updated based on patient status, additional functional criteria and insurance authorization.  Follow Up Recommendations  Skilled nursing-short term rehab (<3 hours/day) Can patient physically be transported by private vehicle: Yes   Assistance Recommended at Discharge Frequent or constant Supervision/Assistance  Patient can return home with the following A little help with walking and/or transfers;A little help with bathing/dressing/bathroom;Assistance with cooking/housework;Assist for transportation;Help with stairs or ramp for entrance   Equipment Recommendations  None recommended by PT    Recommendations for Other Services       Precautions / Restrictions Precautions Precautions: Fall;Knee Restrictions RLE Weight Bearing: Weight bearing as tolerated     Mobility  Bed Mobility Overal bed mobility: Needs Assistance Bed Mobility: Supine to Sit     Supine to sit: Min guard     General bed mobility comments: Min guard for safety, pt able to use gait belt to self-assist RLE off bed.    Transfers Overall transfer level: Needs assistance Equipment used: Rolling walker (2 wheels) Transfers: Sit to/from Stand Sit to Stand: Min guard           General transfer comment: verbal cues for positioning and safety especially with returning to sitting    Ambulation/Gait Ambulation/Gait assistance: Min  guard Gait Distance (Feet): 100 Feet Assistive device: Rolling walker (2 wheels) Gait Pattern/deviations: Step-to pattern, Antalgic, Decreased stance time - right Gait velocity: decreased     General Gait Details: verbal cues for sequence, RW positioning, step length; pt reports UE fatigue   Stairs             Wheelchair Mobility    Modified Rankin (Stroke Patients Only)       Balance                                            Cognition Arousal/Alertness: Awake/alert Behavior During Therapy: WFL for tasks assessed/performed Overall Cognitive Status: Within Functional Limits for tasks assessed                                          Exercises Total Joint Exercises Ankle Circles/Pumps: AROM, Both, 10 reps Quad Sets: AROM, Right, 10 reps Heel Slides: AAROM, Right, 10 reps Hip ABduction/ADduction: 10 reps, Right, AAROM Straight Leg Raises: AAROM, Right, 10 reps    General Comments        Pertinent Vitals/Pain Pain Assessment Pain Assessment: 0-10 Pain Score: 3  Pain Location: right knee Pain Descriptors / Indicators: Aching, Sore Pain Intervention(s): Monitored during session, Premedicated before session, Repositioned    Home Living                          Prior Function  PT Goals (current goals can now be found in the care plan section) Progress towards PT goals: Progressing toward goals    Frequency    7X/week      PT Plan Current plan remains appropriate    Co-evaluation              AM-PAC PT "6 Clicks" Mobility   Outcome Measure  Help needed turning from your back to your side while in a flat bed without using bedrails?: A Little Help needed moving from lying on your back to sitting on the side of a flat bed without using bedrails?: A Little Help needed moving to and from a bed to a chair (including a wheelchair)?: A Little Help needed standing up from a chair using your  arms (e.g., wheelchair or bedside chair)?: A Little Help needed to walk in hospital room?: A Little Help needed climbing 3-5 steps with a railing? : A Lot 6 Click Score: 17    End of Session Equipment Utilized During Treatment: Gait belt Activity Tolerance: Patient tolerated treatment well Patient left: with call bell/phone within reach;in chair;with chair alarm set Nurse Communication: Mobility status PT Visit Diagnosis: Difficulty in walking, not elsewhere classified (R26.2)     Time: 1039-1101 PT Time Calculation (min) (ACUTE ONLY): 22 min  Charges:  $Therapeutic Exercise: 8-22 mins                    Jannette Spanner PT, DPT Physical Therapist Acute Rehabilitation Services Preferred contact method: Secure Chat Weekend Pager Only: [phone] Office: [phone]    Myrtis Hopping [place] 04/12/2022, 3:46 PM

## 2022-04-12 NOTE — Addendum Note (Signed)
Addendum  created 04/12/22 1203 by Belinda Block, MD   Clinical Note Signed, Intraprocedure Blocks edited, Intraprocedure Meds edited, SmartForm saved

## 2022-04-12 NOTE — TOC Progression Note (Addendum)
Transition of Care [facility]) - Progression Note    Patient Details  Name: [name] MRN: [id number] Date of Birth: [birth year]  Transition of Care [facility]) CM/SW Contact  Lennart Pall, LCSW Phone Number: 04/12/2022, 11:36 AM  Clinical Narrative:    Informed by UR dept that pt's status has been changed to Inpatient.  Have notified pt,  son and [facility].  At this point, will plan for admission to [facility] on Monday.   Expected Discharge Plan: [place] (vs. return to ALF) Barriers to Discharge: Continued Medical Work up  Expected Discharge Plan and Services Expected Discharge Plan: [place] (vs. return to ALF) In-house Referral: Clinical Social Work   Post Acute Care Choice: [place] Living arrangements for the past 2 months: [place] ([place])                                       Social Determinants of Health (SDOH) Interventions    Readmission Risk Interventions     No data to display

## 2022-04-13 LAB — GLUCOSE, CAPILLARY: Glucose-Capillary: 138 mg/dL — ABNORMAL HIGH (ref 70–99)

## 2022-04-13 NOTE — Progress Notes (Signed)
   Subjective: 3 Days Post-Op Procedure(s) (LRB): TOTAL KNEE ARTHROPLASTY (Right)  Pt awaiting SNF placement on Monday Overall improving Pain is moderate Denies any new symptoms or issues Patient reports pain as moderate.  Objective:   VITALS:   Vitals:   04/13/22 0601 04/13/22 1016  BP: (!) 128/43 (!) 139/43  Pulse: 83 83  Resp: 14 18  Temp: 98.7 F (37.1 C) 99.2 F (37.3 C)  SpO2: 90% 99%    Right knee incision healing well Ace removed Nv intact distally No rashes or edema distally  LABS Recent Labs    04/10/22 1153  HGB 11.2*  HCT 36.3  WBC 8.4  PLT 266    Recent Labs    04/10/22 1153  CREATININE 0.92     Assessment/Plan: 3 Days Post-Op Procedure(s) (LRB): TOTAL KNEE ARTHROPLASTY (Right) Continue PT/OT Plan for SNF placement on Monday Pain management as needed     Merla Riches PA-C, [place] is now [facility]  [place] [address] [place], [place], [place] [address] Phone: [phone] [email or web address] Facebook  [employer]

## 2022-04-13 NOTE — Progress Notes (Signed)
Physical Therapy Treatment Patient Details Name: [name] MRN: [id number] DOB: December 14, 1931 Today's Date: 04/13/2022   History of Present Illness Pt is an 88yo female presenting s/p R-TKA on 04/10/22. PMH: cognitive dysfunction, DM with peripheral neuropathy, HLD, HTN, PNA, GERD,    PT Comments    Pt is progressing with PT; Ace wrap removed by PA, pt with notable RLE edema (rings d/t ace wrap). Encouraged elevation and ankle pumps  Recommendations for follow up therapy are one component of a multi-disciplinary discharge planning process, led by the attending physician.  Recommendations may be updated based on patient status, additional functional criteria and insurance authorization.  Follow Up Recommendations  Skilled nursing-short term rehab (<3 hours/day) Can patient physically be transported by private vehicle: Yes   Assistance Recommended at Discharge Frequent or constant Supervision/Assistance  Patient can return home with the following A little help with walking and/or transfers;A little help with bathing/dressing/bathroom;Assistance with cooking/housework;Assist for transportation;Help with stairs or ramp for entrance   Equipment Recommendations  None recommended by PT    Recommendations for Other Services       Precautions / Restrictions Precautions Precautions: Fall;Knee Precaution Booklet Issued: No Precaution Comments: no pillow under the knee Restrictions Weight Bearing Restrictions: No RLE Weight Bearing: Weight bearing as tolerated     Mobility  Bed Mobility Overal bed mobility: Needs Assistance Bed Mobility: Supine to Sit     Supine to sit: Min guard, Supervision, HOB elevated     General bed mobility comments: pt able to self assist RLE with gait belt, requires incr time    Transfers Overall transfer level: Needs assistance Equipment used: Rolling walker (2 wheels) Transfers: Sit to/from Stand Sit to Stand: Min guard, Min assist            General transfer comment: cues for hand placement and RLE position, cues for safety,backing up to chair prior to descent    Ambulation/Gait Ambulation/Gait assistance: Min guard, Min assist Gait Distance (Feet): 60 Feet Assistive device: Rolling walker (2 wheels) Gait Pattern/deviations: Step-to pattern, Antalgic, Decreased stance time - right Gait velocity: decreased     General Gait Details: verbal cues for sequence, RW positioning, step length; also for right foot in more neutral position due to observed toe out;  pt reports slight UE fatigue   Stairs             Wheelchair Mobility    Modified Rankin (Stroke Patients Only)       Balance           Standing balance support: Reliant on assistive device for balance, During functional activity, Bilateral upper extremity supported Standing balance-Leahy Scale: Poor                              Cognition Arousal/Alertness: Awake/alert Behavior During Therapy: WFL for tasks assessed/performed Overall Cognitive Status: Within Functional Limits for tasks assessed                                          Exercises Total Joint Exercises Ankle Circles/Pumps: AROM, Both, 10 reps Quad Sets: AROM, Both, 5 reps Heel Slides: AAROM, Right, 10 reps    General Comments        Pertinent Vitals/Pain Pain Assessment Pain Assessment: 0-10 Pain Score: 5  Pain Location: right knee Pain Descriptors / Indicators: Aching, Sore  Pain Intervention(s): Limited activity within patient's tolerance, Monitored during session, Premedicated before session, Repositioned    Home Living                          Prior Function            PT Goals (current goals can now be found in the care plan section) Acute Rehab PT Goals Patient Stated Goal: walk 1 mile PT Goal Formulation: With patient Time For Goal Achievement: 04/17/22 Potential to Achieve Goals: Good Progress towards PT goals:  Progressing toward goals    Frequency    7X/week      PT Plan Current plan remains appropriate    Co-evaluation              AM-PAC PT "6 Clicks" Mobility   Outcome Measure  Help needed turning from your back to your side while in a flat bed without using bedrails?: A Little Help needed moving from lying on your back to sitting on the side of a flat bed without using bedrails?: A Little Help needed moving to and from a bed to a chair (including a wheelchair)?: A Little Help needed standing up from a chair using your arms (e.g., wheelchair or bedside chair)?: A Little Help needed to walk in hospital room?: A Little Help needed climbing 3-5 steps with a railing? : A Little 6 Click Score: 18    End of Session Equipment Utilized During Treatment: Gait belt Activity Tolerance: Patient tolerated treatment well Patient left: with call bell/phone within reach;in chair;with chair alarm set Nurse Communication: Mobility status PT Visit Diagnosis: Difficulty in walking, not elsewhere classified (R26.2) Pain - Right/Left: Right Pain - part of body: Knee     Time: 7681-1572 PT Time Calculation (min) (ACUTE ONLY): 20 min  Charges:  $Gait Training: 8-22 mins                     Baxter Flattery, PT  Acute Rehab Dept [facility]) [phone]  [facility] Weekend Pager (Saturday/Sunday only)  [phone]  04/13/2022    [facility] 04/13/2022, 11:43 AM

## 2022-04-13 NOTE — Plan of Care (Signed)
°  Problem: Clinical Measurements: °Goal: Respiratory complications will improve °Outcome: Progressing °  °Problem: Clinical Measurements: °Goal: Cardiovascular complication will be avoided °Outcome: Progressing °  °Problem: Activity: °Goal: Risk for activity intolerance will decrease °Outcome: Progressing °  °Problem: Pain Managment: °Goal: General experience of comfort will improve °Outcome: Progressing °  °

## 2022-04-14 LAB — GLUCOSE, CAPILLARY: Glucose-Capillary: 155 mg/dL — ABNORMAL HIGH (ref 70–99)

## 2022-04-14 MED ORDER — POLYETHYLENE GLYCOL 3350 17 G PO PACK
17.0000 g | PACK | Freq: Every day | ORAL | Status: DC
  Administered 2022-04-14 – 2022-04-15 (×2): 17 g via ORAL
  Filled 2022-04-14 (×2): qty 1

## 2022-04-14 MED ORDER — BISACODYL 10 MG RE SUPP
10.0000 mg | Freq: Once | RECTAL | Status: AC
  Administered 2022-04-14: 10 mg via RECTAL
  Filled 2022-04-14: qty 1

## 2022-04-14 NOTE — Plan of Care (Signed)
  Problem: Pain Managment: Goal: General experience of comfort will improve Outcome: Progressing   Problem: Safety: Goal: Ability to remain free from injury will improve Outcome: Progressing   Problem: Skin Integrity: Goal: Risk for impaired skin integrity will decrease Outcome: Progressing   Problem: Pain Management: Goal: Pain level will decrease with appropriate interventions Outcome: Progressing

## 2022-04-14 NOTE — TOC Progression Note (Signed)
Transition of Care [facility]) - Progression Note    Patient Details  Name: [name] MRN: [id number] Date of Birth: 19-Oct-1931  Transition of Care [facility]) CM/SW Contact  Ross Ludwig, [place] Phone Number: 04/14/2022, 11:55 AM  Clinical Narrative:     Patient has a bed at [facility] for Monday.  Patient needed a new covid test within 24 hours of discharge.  Covid test ordered, waiting for results, CSW to continue to follow patient's progress throughout discharge planning.   Expected Discharge Plan: [place] (vs. return to ALF) Barriers to Discharge: Continued Medical Work up  Expected Discharge Plan and Services Expected Discharge Plan: [place] (vs. return to ALF) In-house Referral: Clinical Social Work   Post Acute Care Choice: [place] Living arrangements for the past 2 months: [place] ([place] of [place])                                       Social Determinants of Health (SDOH) Interventions    Readmission Risk Interventions     No data to display

## 2022-04-14 NOTE — Progress Notes (Signed)
Physical Therapy Treatment Patient Details Name: [name] MRN: [id number] DOB: [birth year] Today's Date: 04/14/2022   History of Present Illness Pt is an 88yo female presenting s/p R-TKA on 04/10/22. PMH: cognitive dysfunction, DM with peripheral neuropathy, HLD, HTN, PNA, GERD,    PT Comments    Pt progressing, however amb limited by fatigue so focused  on TKA HEP and knee ROM. Incr knee flexion end of session. Continue to follow, agree with plan/recommend SNF   Recommendations for follow up therapy are one component of a multi-disciplinary discharge planning process, led by the attending physician.  Recommendations may be updated based on patient status, additional functional criteria and insurance authorization.  Follow Up Recommendations  Skilled nursing-short term rehab (<3 hours/day) Can patient physically be transported by private vehicle: Yes   Assistance Recommended at Discharge Frequent or constant Supervision/Assistance  Patient can return home with the following A little help with walking and/or transfers;A little help with bathing/dressing/bathroom;Assistance with cooking/housework;Assist for transportation;Help with stairs or ramp for entrance   Equipment Recommendations  None recommended by PT    Recommendations for Other Services       Precautions / Restrictions Precautions Precautions: Fall;Knee Precaution Booklet Issued: No Precaution Comments: no pillow under the knee Restrictions Weight Bearing Restrictions: No RLE Weight Bearing: Weight bearing as tolerated     Mobility  Bed Mobility Overal bed mobility: Needs Assistance Bed Mobility: Supine to Sit     Supine to sit: Min guard, Supervision, HOB elevated     General bed mobility comments: pt able to self assist RLE with gait belt, requires incr time    Transfers Overall transfer level: Needs assistance Equipment used: Rolling walker (2 wheels) Transfers: Sit to/from Stand, Bed to  chair/wheelchair/BSC Sit to Stand: Min guard, Min assist Stand pivot transfers: Min assist         General transfer comment: cues for hand placement and RLE position, cues for safety,backing up to bed prior to descent    Ambulation/Gait Ambulation/Gait assistance: Min guard, Min assist Gait Distance (Feet): 4 Feet (pivotal) Assistive device: Rolling walker (2 wheels) Gait Pattern/deviations: Step-to pattern, Antalgic, Decreased stance time - right Gait velocity: decreased     General Gait Details: limited by pain   Stairs             Wheelchair Mobility    Modified Rankin (Stroke Patients Only)       Balance           Standing balance support: Reliant on assistive device for balance, During functional activity, Bilateral upper extremity supported Standing balance-Leahy Scale: Poor                              Cognition Arousal/Alertness: Awake/alert Behavior During Therapy: WFL for tasks assessed/performed Overall Cognitive Status: Within Functional Limits for tasks assessed                                          Exercises Total Joint Exercises Ankle Circles/Pumps: AROM, Both, 10 reps Quad Sets: AROM, Both, 20 reps Heel Slides: AAROM, Right, 10 reps Hip ABduction/ADduction: 10 reps, Right, AAROM Straight Leg Raises: AAROM, Right, 10 reps Goniometric ROM: 10 to 70 degrees flexion    General Comments        Pertinent Vitals/Pain Pain Assessment Pain Assessment: 0-10 Pain Score: 5  Pain  Location: right knee Pain Descriptors / Indicators: Aching, Sore Pain Intervention(s): Limited activity within patient's tolerance, Monitored during session, Premedicated before session, Repositioned, Ice applied    Home Living                          Prior Function            PT Goals (current goals can now be found in the care plan section) Acute Rehab PT Goals Patient Stated Goal: walk 1 mile PT Goal  Formulation: With patient Time For Goal Achievement: 04/17/22 Potential to Achieve Goals: Good Progress towards PT goals: Progressing toward goals    Frequency    7X/week      PT Plan Current plan remains appropriate    Co-evaluation              AM-PAC PT "6 Clicks" Mobility   Outcome Measure  Help needed turning from your back to your side while in a flat bed without using bedrails?: A Little Help needed moving from lying on your back to sitting on the side of a flat bed without using bedrails?: A Little Help needed moving to and from a bed to a chair (including a wheelchair)?: A Little Help needed standing up from a chair using your arms (e.g., wheelchair or bedside chair)?: A Little Help needed to walk in hospital room?: A Little Help needed climbing 3-5 steps with a railing? : A Little 6 Click Score: 18    End of Session Equipment Utilized During Treatment: Gait belt Activity Tolerance: Patient tolerated treatment well Patient left: with call bell/phone within reach;in bed;with bed alarm set Nurse Communication: Mobility status PT Visit Diagnosis: Difficulty in walking, not elsewhere classified (R26.2) Pain - Right/Left: Right Pain - part of body: Knee     Time: 5885-0277 PT Time Calculation (min) (ACUTE ONLY): 14 min  Charges:  $Gait Training: 8-22 mins                     Baxter Flattery, PT  Acute Rehab Dept [facility]) [phone]  [facility] Weekend Pager ([place] only)  [phone]  04/14/2022    [facility] 04/14/2022, 10:07 AM

## 2022-04-14 NOTE — Progress Notes (Signed)
Physical Therapy Treatment Patient Details Name: [name] MRN: [id number] DOB: [birth year] Today's Date: 04/14/2022   History of Present Illness Pt is an 88yo female presenting s/p R-TKA on 04/10/22. PMH: cognitive dysfunction, DM with peripheral neuropathy, HLD, HTN, PNA, GERD,    PT Comments    Pt progressing toward goals, continue to follow in acute setting.    Recommendations for follow up therapy are one component of a multi-disciplinary discharge planning process, led by the attending physician.  Recommendations may be updated based on patient status, additional functional criteria and insurance authorization.  Follow Up Recommendations  Skilled nursing-short term rehab (<3 hours/day) Can patient physically be transported by private vehicle: Yes   Assistance Recommended at Discharge Frequent or constant Supervision/Assistance  Patient can return home with the following A little help with walking and/or transfers;A little help with bathing/dressing/bathroom;Assistance with cooking/housework;Assist for transportation;Help with stairs or ramp for entrance   Equipment Recommendations  None recommended by PT    Recommendations for Other Services       Precautions / Restrictions Precautions Precautions: Fall;Knee Precaution Booklet Issued: No Precaution Comments: no pillow under the knee Restrictions Weight Bearing Restrictions: No RLE Weight Bearing: Weight bearing as tolerated     Mobility  Bed Mobility Overal bed mobility: Needs Assistance Bed Mobility: Supine to Sit, Sit to Supine     Supine to sit: Min guard, Supervision, HOB elevated Sit to supine: Min guard, Supervision   General bed mobility comments: pt able to self assist RLE with gait belt, requires incr time    Transfers Overall transfer level: Needs assistance Equipment used: Rolling walker (2 wheels) Transfers: Sit to/from Stand, Bed to chair/wheelchair/BSC Sit to Stand: Min guard Stand pivot  transfers: Min assist         General transfer comment: cues for hand placement and RLE position, cues for safety,backing up to bed prior to descent    Ambulation/Gait Ambulation/Gait assistance: Min guard Gait Distance (Feet): 50 Feet Assistive device: Rolling walker (2 wheels) Gait Pattern/deviations: Step-to pattern, Antalgic, Decreased stance time - right Gait velocity: decreased     General Gait Details: verbal cues for initial sequence, proximity to [employer] Mobility    Modified Rankin (Stroke Patients Only)       Balance           Standing balance support: Reliant on assistive device for balance, During functional activity, Bilateral upper extremity supported Standing balance-Leahy Scale: Poor                              Cognition Arousal/Alertness: Awake/alert Behavior During Therapy: WFL for tasks assessed/performed Overall Cognitive Status: Within Functional Limits for tasks assessed                                          Exercises Total Joint Exercises Ankle Circles/Pumps: AROM, Both, 10 reps Quad Sets: AROM, Both, 20 reps Heel Slides: AAROM, Right, 10 reps Hip ABduction/ADduction: 10 reps, Right, AAROM Straight Leg Raises: AAROM, Right, 10 reps Goniometric ROM: 10 to 70 degrees flexion    General Comments        Pertinent Vitals/Pain Pain Assessment Pain Assessment: 0-10 Pain Score: 5  Pain Location: right knee Pain Descriptors / Indicators: Aching, Sore Pain Intervention(s):  Limited activity within patient's tolerance, Monitored during session, Premedicated before session, Repositioned, Ice applied    Home Living                          Prior Function            PT Goals (current goals can now be found in the care plan section) Acute Rehab PT Goals Patient Stated Goal: walk 1 mile PT Goal Formulation: With patient Time For Goal Achievement:  04/17/22 Potential to Achieve Goals: Good Progress towards PT goals: Progressing toward goals    Frequency    7X/week      PT Plan Current plan remains appropriate    Co-evaluation              AM-PAC PT "6 Clicks" Mobility   Outcome Measure  Help needed turning from your back to your side while in a flat bed without using bedrails?: A Little Help needed moving from lying on your back to sitting on the side of a flat bed without using bedrails?: A Little Help needed moving to and from a bed to a chair (including a wheelchair)?: A Little Help needed standing up from a chair using your arms (e.g., wheelchair or bedside chair)?: A Little Help needed to walk in hospital room?: A Little Help needed climbing 3-5 steps with a railing? : A Little 6 Click Score: 18    End of Session Equipment Utilized During Treatment: Gait belt Activity Tolerance: Patient tolerated treatment well Patient left: with call bell/phone within reach;in bed;with bed alarm set Nurse Communication: Mobility status PT Visit Diagnosis: Difficulty in walking, not elsewhere classified (R26.2) Pain - Right/Left: Right Pain - part of body: Knee     Time: 5465-6812 PT Time Calculation (min) (ACUTE ONLY): 26 min  Charges:  $Gait Training: 8-22 mins $Therapeutic Activity: 8-22 mins                     Baxter Flattery, PT  Acute Rehab Dept [facility]) [phone]  [facility] Weekend Pager (Saturday/Sunday only)  [phone]  04/14/2022    [facility] 04/14/2022, 3:06 PM

## 2022-04-14 NOTE — Progress Notes (Signed)
   Subjective: 4 Days Post-Op Procedure(s) (LRB): TOTAL KNEE ARTHROPLASTY (Right)  Pt c/o mild pain/soreness and constipation Denies any new symptoms or issues Plan for d/c tomorrow Patient reports pain as mild.  Objective:   VITALS:   Vitals:   04/13/22 2156 04/14/22 0650  BP: (!) 132/56 (!) 175/56  Pulse: 78 98  Resp: 18 18  Temp: 98.9 F (37.2 C) 99.2 F (37.3 C)  SpO2: 92% 100%    Right knee incision healing well Nv intact distally No rashes or edema distally Guarded rom  LABS No results for input(s): "HGB", "HCT", "WBC", "PLT" in the last 72 hours.  No results for input(s): "NA", "K", "BUN", "CREATININE", "GLUCOSE" in the last 72 hours.   Assessment/Plan: 4 Days Post-Op Procedure(s) (LRB): TOTAL KNEE ARTHROPLASTY (Right) Continue PT/OT Will treat constipation with miralax and dulcolax Plan for d/c tomorrow to SNF Pain management as needed   Kathrynn Speed, [place] is now [employer] [facility] [address] [place], [place], [place] [address] Phone: [phone] [email or web address] Facebook  [employer]

## 2022-04-15 DIAGNOSIS — E1151 Type 2 diabetes mellitus with diabetic peripheral angiopathy without gangrene: Secondary | ICD-10-CM | POA: Diagnosis not present

## 2022-04-15 DIAGNOSIS — J3089 Other allergic rhinitis: Secondary | ICD-10-CM | POA: Diagnosis not present

## 2022-04-15 DIAGNOSIS — R41841 Cognitive communication deficit: Secondary | ICD-10-CM | POA: Diagnosis not present

## 2022-04-15 DIAGNOSIS — E1159 Type 2 diabetes mellitus with other circulatory complications: Secondary | ICD-10-CM | POA: Diagnosis not present

## 2022-04-15 DIAGNOSIS — F411 Generalized anxiety disorder: Secondary | ICD-10-CM | POA: Diagnosis not present

## 2022-04-15 DIAGNOSIS — I700 Atherosclerosis of aorta: Secondary | ICD-10-CM | POA: Diagnosis not present

## 2022-04-15 DIAGNOSIS — I10 Essential (primary) hypertension: Secondary | ICD-10-CM | POA: Diagnosis not present

## 2022-04-15 DIAGNOSIS — M1711 Unilateral primary osteoarthritis, right knee: Secondary | ICD-10-CM | POA: Diagnosis not present

## 2022-04-15 DIAGNOSIS — K219 Gastro-esophageal reflux disease without esophagitis: Secondary | ICD-10-CM | POA: Diagnosis not present

## 2022-04-15 DIAGNOSIS — Z96651 Presence of right artificial knee joint: Secondary | ICD-10-CM | POA: Diagnosis not present

## 2022-04-15 DIAGNOSIS — F0390 Unspecified dementia without behavioral disturbance: Secondary | ICD-10-CM | POA: Diagnosis not present

## 2022-04-15 DIAGNOSIS — Z471 Aftercare following joint replacement surgery: Secondary | ICD-10-CM | POA: Diagnosis not present

## 2022-04-15 DIAGNOSIS — E1169 Type 2 diabetes mellitus with other specified complication: Secondary | ICD-10-CM | POA: Diagnosis not present

## 2022-04-15 DIAGNOSIS — E43 Unspecified severe protein-calorie malnutrition: Secondary | ICD-10-CM | POA: Diagnosis not present

## 2022-04-15 DIAGNOSIS — Z741 Need for assistance with personal care: Secondary | ICD-10-CM | POA: Diagnosis not present

## 2022-04-15 DIAGNOSIS — R262 Difficulty in walking, not elsewhere classified: Secondary | ICD-10-CM | POA: Diagnosis not present

## 2022-04-15 DIAGNOSIS — F09 Unspecified mental disorder due to known physiological condition: Secondary | ICD-10-CM | POA: Diagnosis not present

## 2022-04-15 DIAGNOSIS — Z23 Encounter for immunization: Secondary | ICD-10-CM | POA: Diagnosis not present

## 2022-04-15 DIAGNOSIS — M6281 Muscle weakness (generalized): Secondary | ICD-10-CM | POA: Diagnosis not present

## 2022-04-15 DIAGNOSIS — Z4789 Encounter for other orthopedic aftercare: Secondary | ICD-10-CM | POA: Diagnosis not present

## 2022-04-15 DIAGNOSIS — E785 Hyperlipidemia, unspecified: Secondary | ICD-10-CM | POA: Diagnosis not present

## 2022-04-15 DIAGNOSIS — E1143 Type 2 diabetes mellitus with diabetic autonomic (poly)neuropathy: Secondary | ICD-10-CM | POA: Diagnosis not present

## 2022-04-15 DIAGNOSIS — M159 Polyosteoarthritis, unspecified: Secondary | ICD-10-CM | POA: Diagnosis not present

## 2022-04-15 DIAGNOSIS — K649 Unspecified hemorrhoids: Secondary | ICD-10-CM | POA: Diagnosis not present

## 2022-04-15 DIAGNOSIS — I152 Hypertension secondary to endocrine disorders: Secondary | ICD-10-CM | POA: Diagnosis not present

## 2022-04-15 DIAGNOSIS — E1142 Type 2 diabetes mellitus with diabetic polyneuropathy: Secondary | ICD-10-CM | POA: Diagnosis not present

## 2022-04-15 DIAGNOSIS — F419 Anxiety disorder, unspecified: Secondary | ICD-10-CM | POA: Diagnosis not present

## 2022-04-15 LAB — RESP PANEL BY RT-PCR (FLU A&B, COVID) ARPGX2
Influenza A by PCR: NEGATIVE
Influenza B by PCR: NEGATIVE
SARS Coronavirus 2 by RT PCR: NEGATIVE

## 2022-04-15 NOTE — Progress Notes (Signed)
Subjective: 5 Days Post-Op Procedure(s) (LRB): TOTAL KNEE ARTHROPLASTY (Right) Patient reports pain as mild.   Denies CP/SOB, N/V, N/T Patient in good spirits this am, was able to get up with PT yesterday  COVID test done yesterday   Objective: Vital signs in last 24 hours: Temp:  [98.2 F (36.8 C)-98.8 F (37.1 C)] 98.8 F (37.1 C) (10/16 0501) Pulse Rate:  [80-86] 80 (10/16 0501) Resp:  [18-20] 18 (10/16 0501) BP: (148-160)/(53-55) 148/55 (10/16 0501) SpO2:  [90 %-98 %] 90 % (10/16 0501)  Intake/Output from previous day: 10/15 0701 - 10/16 0700 In: 1680 [P.O.:1680] Out: -  Intake/Output this shift: No intake/output data recorded.  No results for input(s): "HGB" in the last 72 hours. No results for input(s): "WBC", "RBC", "HCT", "PLT" in the last 72 hours. No results for input(s): "NA", "K", "CL", "CO2", "BUN", "CREATININE", "GLUCOSE", "CALCIUM" in the last 72 hours. No results for input(s): "LABPT", "INR" in the last 72 hours.  Neurologically intact Neurovascular intact Sensation intact distally Intact pulses distally Dorsiflexion/Plantar flexion intact Incision: dressing C/D/I Compartment soft   Assessment/Plan: 5 Days Post-Op Procedure(s) (LRB): TOTAL KNEE ARTHROPLASTY (Right) Up with therapy Discharge to SNF Patient getting discharged to SNF today [facility]) Continue with current pain management regimen DVT Ppx: Aspirin 81 mg twice daily We will continue with keeping dressings on and dry until follow-up appointment in 2 weeks    Anticipated LOS equal to or greater than 2 midnights due to - Age 86 and older with one or more of the following:  - Obesity  - Expected need for hospital services (PT, OT, Nursing) required for safe  discharge  - Anticipated need for postoperative skilled nursing care or inpatient rehab  - Active co-morbidities: None OR   - Unanticipated findings during/Post Surgery: Slow post-op progression: GI, pain control, mobility  -  Patient is a high risk of re-admission due to: Barriers to post-acute care (logistical, no family support in home)   Drue Novel, PA-C [facility] with Dr. Theda Sers [phone] 04/15/2022, 7:29 AM

## 2022-04-15 NOTE — Plan of Care (Signed)
  Problem: Health Behavior/Discharge Planning: Goal: Ability to manage health-related needs will improve Outcome: Progressing   Problem: Clinical Measurements: Goal: Ability to maintain clinical measurements within normal limits will improve Outcome: Progressing   Problem: Activity: Goal: Risk for activity intolerance will decrease Outcome: Progressing   Problem: Coping: Goal: Level of anxiety will decrease Outcome: Progressing   Problem: Pain Managment: Goal: General experience of comfort will improve Outcome: Progressing   Problem: Safety: Goal: Ability to remain free from injury will improve Outcome: Progressing   

## 2022-04-15 NOTE — Plan of Care (Signed)
  Problem: Coping: Goal: Level of anxiety will decrease Outcome: Progressing   Problem: Pain Managment: Goal: General experience of comfort will improve Outcome: Progressing   

## 2022-04-15 NOTE — Plan of Care (Signed)
Discharge instructions given to the patient including medications and follow up.  

## 2022-04-15 NOTE — TOC Transition Note (Signed)
Transition of Care [facility]) - CM/SW Discharge Note   Patient Details  Name: [name] MRN: [id number] Date of Birth: 1931/07/19  Transition of Care [facility]) CM/SW Contact:  Lennart Pall, LCSW Phone Number: 04/15/2022, 11:57 AM   Clinical Narrative:    Pt medically ready for dc today to [facility] SNF.  Daughter-in-law to provide transportation to facility.  RN to call report to [phone].  No further TOC needs.   Final next level of care: Skilled Nursing Facility Barriers to Discharge: Barriers Resolved   Patient Goals and CMS Choice Patient states their goals for this hospitalization and ongoing recovery are:: rehab and then return to ALF      Discharge Placement   Existing PASRR number confirmed : 04/12/22          Patient chooses bed at: [facility] Patient to be transferred to facility by: daughter-in-law Name of family member notified: daughter-in-law    Discharge Plan and Services In-house Referral: Clinical Social Work   Post Acute Care Choice: [facility]          DME Arranged: N/A DME Agency: NA                  Social Determinants of Health ([place]) Interventions     Readmission Risk Interventions     No data to display

## 2022-04-15 NOTE — Progress Notes (Signed)
Physical Therapy Treatment Patient Details Name: [name] MRN: [id number] DOB: 04/02/1932 Today's Date: 04/15/2022   History of Present Illness Pt is an 88yo female presenting s/p R-TKA on 04/10/22. PMH: cognitive dysfunction, DM with peripheral neuropathy, HLD, HTN, PNA, GERD,    PT Comments    Pt limited by pain this session but agreeable to work within limits, encouraged knee ROM when pain is improved. Pt has incr knee/RLE edema today. Reviewed importance of terminal knee extension. Pt will benefit from SNF to maximize independence.     Recommendations for follow up therapy are one component of a multi-disciplinary discharge planning process, led by the attending physician.  Recommendations may be updated based on patient status, additional functional criteria and insurance authorization.  Follow Up Recommendations  Skilled nursing-short term rehab (<3 hours/day) Can patient physically be transported by private vehicle: Yes   Assistance Recommended at Discharge Frequent or constant Supervision/Assistance  Patient can return home with the following A little help with walking and/or transfers;A little help with bathing/dressing/bathroom;Assistance with cooking/housework;Assist for transportation;Help with stairs or ramp for entrance   Equipment Recommendations  None recommended by PT    Recommendations for Other Services       Precautions / Restrictions Precautions Precautions: Fall;Knee Precaution Booklet Issued: No Precaution Comments: no pillow under the knee, reviewed importance of terminal knee extension Restrictions Weight Bearing Restrictions: No RLE Weight Bearing: Weight bearing as tolerated     Mobility  Bed Mobility Overal bed mobility: Needs Assistance Bed Mobility: Supine to Sit     Supine to sit: Supervision, HOB elevated     General bed mobility comments: pt able to self assist RLE with gait belt, requires incr time and effort     Transfers Overall transfer level: Needs assistance Equipment used: Rolling walker (2 wheels) Transfers: Sit to/from Stand, Bed to chair/wheelchair/BSC Sit to Stand: Min guard           General transfer comment: cues for hand placement and RLE position, cues for safety and control of descent    Ambulation/Gait Ambulation/Gait assistance: Min guard Gait Distance (Feet): 40 Feet Assistive device: Rolling walker (2 wheels) Gait Pattern/deviations: Step-to pattern, Antalgic, Decreased stance time - right Gait velocity: decreased     General Gait Details: verbal cues  proximity to RW, distance limited by pain   Stairs             Wheelchair Mobility    Modified Rankin (Stroke Patients Only)       Balance   Sitting-balance support: Feet supported, No upper extremity supported Sitting balance-Leahy Scale: Good     Standing balance support: Reliant on assistive device for balance, During functional activity, Bilateral upper extremity supported Standing balance-Leahy Scale: Poor                              Cognition Arousal/Alertness: Awake/alert Behavior During Therapy: WFL for tasks assessed/performed Overall Cognitive Status: Within Functional Limits for tasks assessed                                          Exercises Total Joint Exercises Ankle Circles/Pumps: AROM, Both, 10 reps Heel Slides: Limitations Heel Slides Limitations: too painful, ice applied    General Comments        Pertinent Vitals/Pain Pain Assessment Pain Assessment: 0-10 Pain Score: 7  Pain Location: right knee Pain Descriptors / Indicators: Aching, Sore Pain Intervention(s): Limited activity within patient's tolerance, Monitored during session, Premedicated before session, Repositioned, Ice applied    Home Living                          Prior Function            PT Goals (current goals can now be found in the care plan section)  Acute Rehab PT Goals Patient Stated Goal: walk 1 mile PT Goal Formulation: With patient Time For Goal Achievement: 04/17/22 Potential to Achieve Goals: Good Progress towards PT goals: Progressing toward goals    Frequency    7X/week      PT Plan Current plan remains appropriate    Co-evaluation              AM-PAC PT "6 Clicks" Mobility   Outcome Measure  Help needed turning from your back to your side while in a flat bed without using bedrails?: A Little Help needed moving from lying on your back to sitting on the side of a flat bed without using bedrails?: A Little Help needed moving to and from a bed to a chair (including a wheelchair)?: A Little Help needed standing up from a chair using your arms (e.g., wheelchair or bedside chair)?: A Little Help needed to walk in hospital room?: A Little Help needed climbing 3-5 steps with a railing? : A Little 6 Click Score: 18    End of Session Equipment Utilized During Treatment: Gait belt Activity Tolerance: Patient tolerated treatment well Patient left: with call bell/phone within reach;in chair;with chair alarm set Nurse Communication: Mobility status PT Visit Diagnosis: Difficulty in walking, not elsewhere classified (R26.2) Pain - Right/Left: Right Pain - part of body: Knee     Time: 2924-4628 PT Time Calculation (min) (ACUTE ONLY): 18 min  Charges:  $Gait Training: 8-22 mins                     [name], PT  Acute Rehab Dept [facility]) [phone]  [facility] Weekend Pager (Saturday/Sunday only)  [phone]  04/15/2022    [facility] 04/15/2022, 11:49 AM

## 2022-04-16 ENCOUNTER — Other Ambulatory Visit: Payer: Self-pay | Admitting: Adult Health

## 2022-04-16 ENCOUNTER — Non-Acute Institutional Stay (SKILLED_NURSING_FACILITY): Payer: Medicare Other | Admitting: Adult Health

## 2022-04-16 DIAGNOSIS — E1159 Type 2 diabetes mellitus with other circulatory complications: Secondary | ICD-10-CM

## 2022-04-16 DIAGNOSIS — F419 Anxiety disorder, unspecified: Secondary | ICD-10-CM

## 2022-04-16 DIAGNOSIS — J3089 Other allergic rhinitis: Secondary | ICD-10-CM | POA: Diagnosis not present

## 2022-04-16 DIAGNOSIS — K219 Gastro-esophageal reflux disease without esophagitis: Secondary | ICD-10-CM

## 2022-04-16 DIAGNOSIS — E1151 Type 2 diabetes mellitus with diabetic peripheral angiopathy without gangrene: Secondary | ICD-10-CM

## 2022-04-16 DIAGNOSIS — I152 Hypertension secondary to endocrine disorders: Secondary | ICD-10-CM | POA: Diagnosis not present

## 2022-04-16 DIAGNOSIS — E1142 Type 2 diabetes mellitus with diabetic polyneuropathy: Secondary | ICD-10-CM

## 2022-04-16 DIAGNOSIS — E1169 Type 2 diabetes mellitus with other specified complication: Secondary | ICD-10-CM | POA: Diagnosis not present

## 2022-04-16 DIAGNOSIS — E43 Unspecified severe protein-calorie malnutrition: Secondary | ICD-10-CM

## 2022-04-16 DIAGNOSIS — I700 Atherosclerosis of aorta: Secondary | ICD-10-CM

## 2022-04-16 DIAGNOSIS — F09 Unspecified mental disorder due to known physiological condition: Secondary | ICD-10-CM

## 2022-04-16 DIAGNOSIS — G4709 Other insomnia: Secondary | ICD-10-CM

## 2022-04-16 DIAGNOSIS — M1711 Unilateral primary osteoarthritis, right knee: Secondary | ICD-10-CM

## 2022-04-16 DIAGNOSIS — E785 Hyperlipidemia, unspecified: Secondary | ICD-10-CM

## 2022-04-16 DIAGNOSIS — F411 Generalized anxiety disorder: Secondary | ICD-10-CM

## 2022-04-16 MED ORDER — HYDROCODONE-ACETAMINOPHEN 5-325 MG PO TABS: 1.0000 | ORAL_TABLET | Freq: Four times a day (QID) | ORAL | 0 refills | Status: AC | PRN

## 2022-04-16 MED ORDER — ALPRAZOLAM 0.25 MG PO TABS: 0.2500 mg | ORAL_TABLET | Freq: Two times a day (BID) | ORAL | 0 refills | Status: AC | PRN

## 2022-04-17 ENCOUNTER — Non-Acute Institutional Stay (SKILLED_NURSING_FACILITY): Payer: Medicare Other | Admitting: Internal Medicine

## 2022-04-17 ENCOUNTER — Encounter: Payer: Self-pay | Admitting: Internal Medicine

## 2022-04-17 DIAGNOSIS — E1151 Type 2 diabetes mellitus with diabetic peripheral angiopathy without gangrene: Secondary | ICD-10-CM

## 2022-04-17 DIAGNOSIS — I10 Essential (primary) hypertension: Secondary | ICD-10-CM

## 2022-04-17 DIAGNOSIS — I700 Atherosclerosis of aorta: Secondary | ICD-10-CM

## 2022-04-17 DIAGNOSIS — Z96651 Presence of right artificial knee joint: Secondary | ICD-10-CM | POA: Diagnosis not present

## 2022-04-17 NOTE — Assessment & Plan Note (Addendum)
PT/OT at SNF as tolerated. Continue ASA DVT prophylaxis.

## 2022-04-17 NOTE — Progress Notes (Unsigned)
NURSING HOME LOCATION:  [facility] ROOM NUMBER: [id number] P  CODE STATUS:  Full Code  PCP:  Sallee Lange MD  This is a comprehensive admission note to this SNFperformed on this date less than 30 days from date of admission. Included are preadmission medical/surgical history; reconciled medication list; family history; social history and comprehensive review of systems.  Corrections and additions to the records were documented. Comprehensive physical exam was also performed. Additionally a clinical summary was entered for each active diagnosis pertinent to this admission in the Problem List to enhance continuity of care.  HPI: She was hospitalized 10/11 - 04/15/2022 for elective right total knee arthroplasty without complication beyond some issue with optimal post op pain control & insurance coverage for rehab @ a SNF.  Apparently the latter relates to her having had rehab preoperatively at this facility.  Total knee arthroplasty was performed 10/11 by Dr. Sydnee Cabal. Preop she had a normochromic, normocytic anemia with H/H of 11.2/36.3. Glucoses ranged from 138 up to 183. Current A1c is 6.7% indicating good control.  CKD stage II was present with a GFR of 60. PT/OT recommended SNF placement for rehab.  Past medical and surgical history includes history of diverticulitis,hypertension, dyslipidemia , & DM with CKD Stage 2. Surgery & procedures include TAH, breast reduction , & prior R knee surgery & hemicolectomy,  Social history: non drinker & non smoker.  She is a retired Marine scientist.  Family history:non contributory due to advanced age.   Review of systems:  PT/OT reports that she has minimal assist requirements except for toileting.  She is able to ambulate 50 feet with a rolling walker.  She does complain of ongoing pain and the knee was reported as being "red and swollen."   She states that she still "hurts like hell" at the operative site.  She was checking her sugars at  the independent living facility high [place]; these ranged from 120-148.  She does validate excess thirst and dry mouth and dry eyes.  She denies any dysphagia.  Constipation is an issue. Constitutional: No fever, significant weight change, fatigue  Eyes: No redness, discharge, pain, vision change ENT/mouth: No nasal congestion, purulent discharge, earache, change in hearing, sore throat  Cardiovascular: No chest pain, palpitations, paroxysmal nocturnal dyspnea, claudication, edema  Respiratory: No cough, sputum production, hemoptysis, DOE, significant snoring, apnea Gastrointestinal: No heartburn, dysphagia, abdominal pain, nausea /vomiting, rectal bleeding, melena, change in bowels Genitourinary: No dysuria, hematuria, pyuria, incontinence, nocturia Musculoskeletal: No joint stiffness, joint swelling, weakness, pain Dermatologic: No rash, pruritus, change in appearance of skin Neurologic: No dizziness, headache, syncope, seizures, numbness, tingling Psychiatric: No significant anxiety, depression, insomnia, anorexia Endocrine: No change in hair/skin/nails, excessive thirst, excessive hunger, excessive urination  Hematologic/lymphatic: No significant bruising, lymphadenopathy, abnormal bleeding Allergy/immunology: No itchy/watery eyes, significant sneezing, urticaria, angioedema  Physical exam:  Pertinent or positive findings: Grade 1 systolic murmur is present at the left base.  Pedal pulses are palpable.  There is a dressing of the right knee.  There is irregular faint erythema around the operative site which blanches to light pressure.  There is trace edema at the sock line.  General appearance: Adequately nourished; no acute distress, increased work of breathing is present.   Lymphatic: No lymphadenopathy about the head, neck, axilla. Eyes: No conjunctival inflammation or lid edema is present. There is no scleral icterus. Ears:  External ear exam shows no significant lesions or deformities.    Nose:  External nasal examination shows no  deformity or inflammation. Nasal mucosa are pink and moist without lesions, exudates Oral exam: Lips and gums are healthy appearing.There is no oropharyngeal erythema or exudate. Neck:  No thyromegaly, masses, tenderness noted.    Heart:  Normal rate and regular rhythm. S1 and S2 normal without gallop, murmur, click, rub.  Lungs: Chest clear to auscultation without wheezes, rhonchi, rales, rubs. Abdomen: Bowel sounds are normal.  Abdomen is soft and nontender with no organomegaly, hernias, masses. GU: Deferred  Extremities:  No cyanosis, clubbing, edema. Neurologic exam:  Strength equal  in upper & lower extremities. Balance, Rhomberg, finger to nose testing could not be completed due to clinical state Deep tendon reflexes are equal Skin: Warm & dry w/o tenting. No significant lesions or rash.  See clinical summary under each active problem in the Problem List with associated updated therapeutic plan

## 2022-04-17 NOTE — Patient Instructions (Signed)
See assessment and plan under each diagnosis in the problem list and acutely for this visit 

## 2022-04-17 NOTE — Assessment & Plan Note (Signed)
A1c 6.7% indicating excellent control.  Glucoses while hospitalized for orthopedic surgery ranged from 138 up to 183.

## 2022-04-17 NOTE — Assessment & Plan Note (Addendum)
Blood pressure record reviewed; blood pressures ranged from a low of 144/60 up to a high of 172/82 despite hydralazine 25 mg twice daily, losartan 100 mg daily and amlodipine 10 mg daily. HCTZ will be problematic because of a history of hyponatremia and SIADH.  Hydralazine will be increased to 25 mg 3 times daily with ongoing blood pressure monitor.

## 2022-04-18 ENCOUNTER — Encounter: Payer: Self-pay | Admitting: Internal Medicine

## 2022-04-19 ENCOUNTER — Encounter: Payer: Self-pay | Admitting: Adult Health

## 2022-04-19 NOTE — Addendum Note (Signed)
Addendum  created 04/19/22 2017 by Belinda Block, MD   Clinical Note Signed, Intraprocedure Blocks edited

## 2022-04-19 NOTE — Progress Notes (Unsigned)
Location:  [place] Room Number: 160 Place of Service:  SNF (31)   CODE STATUS: dnr   Allergies  Allergen Reactions   Hydrocortisone Itching   Lisinopril     Other reaction(s): Lethargy (intolerance)   Phenergan [Promethazine Hcl] Other (See Comments)    hallucinations   Statins Other (See Comments)    Severe myalgias   Trazodone And Nefazodone Cough   Prednisone Rash    Chief Complaint  Patient presents with   Hospitalization Follow-up    HPI:  She is a 88 year old woman who has been hospitalized from 04-10-22 through 04-15-22. She had right knee arthroplasty on 04-10-22. In the hospital she was able to ambulate 10 feet with physical therapy. She is here for short term rehab with her goal to return back home. She is presently denying any pain. She denies any insomnia; no changes in appetite. She will continue to be followed for her chronic illnesses including:  Aortic atherosclerosis  Hypertension associated with type 2 diabetes mellitus; Type 2 diabetes mellitus with atherosclerosis of aorta Chronic non-seasonal allergic rhinitis:     Past Medical History:  Diagnosis Date   Cognitive dysfunction 03/04/2019   Patient scores 21 out of 30 on [place] cognitive assessment September 2020   Diabetes mellitus without complication ([place])    Diverticulitis    Frailty 03/04/2019   H/O bilateral breast reduction surgery    Hyperlipidemia    a. intolerant to statins.    Hypertension     Past Surgical History:  Procedure Laterality Date   ABDOMINAL HYSTERECTOMY  2003   APPENDECTOMY     age 59   BREAST REDUCTION SURGERY     age 68   COLON SURGERY Left 2011   Hemicolectomy due to diverticulitis   EYE SURGERY  03/30/2009   cataract   KNEE SURGERY Right 2003   knee cap   LAPAROSCOPIC INCISIONAL / UMBILICAL / VENTRAL HERNIA REPAIR  02/23/2007   REDUCTION MAMMAPLASTY Bilateral 2001   REFRACTIVE SURGERY     TOTAL KNEE ARTHROPLASTY Right 04/10/2022    Procedure: TOTAL KNEE ARTHROPLASTY;  Surgeon: Sydnee Cabal, MD;  Location: [facility];  Service: Orthopedics;  Laterality: Right;  adductor canal    Social History   Socioeconomic History   Marital status: Widowed    Spouse name: Not on file   Number of children: Not on file   Years of education: Not on file   Highest education level: Not on file  Occupational History   Not on file  Tobacco Use   Smoking status: Never   Smokeless tobacco: Never  Vaping Use   Vaping Use: Never used  Substance and Sexual Activity   Alcohol use: No   Drug use: No   Sexual activity: Never    Birth control/protection: Abstinence  Other Topics Concern   Not on file  Social History Narrative   Not on file   Social Determinants of Health   Financial Resource Strain: Low Risk  (08/28/2021)   Overall Financial Resource Strain (CARDIA)    Difficulty of Paying Living Expenses: Not hard at all  Food Insecurity: No Food Insecurity (04/10/2022)   Hunger Vital Sign    Worried About Running Out of Food in the Last Year: Never true    [place] in the Last Year: Never true  Transportation Needs: No Transportation Needs (04/10/2022)   PRAPARE - Transportation    Lack of Transportation (Medical): No    Lack  of Transportation (Non-Medical): No  Physical Activity: Sufficiently Active (08/28/2021)   Exercise Vital Sign    Days of Exercise per Week: 5 days    Minutes of Exercise per Session: 30 min  Stress: No Stress Concern Present (08/28/2021)   [place]    Feeling of Stress : Not at all  Social Connections: Moderately Integrated (08/28/2021)   Social Connection and Isolation Panel [NHANES]    Frequency of Communication with Friends and Family: More than three times a week    Frequency of Social Gatherings with Friends and Family: More than three times a week    Attends Religious Services: More than 4 times per year    Active Member  of [employer] or Organizations: Yes    Attends Archivist Meetings: More than 4 times per year    Marital Status: Widowed  Intimate Partner Violence: Not At Risk (04/10/2022)   Humiliation, Afraid, Rape, and Kick questionnaire    Fear of Current or Ex-Partner: No    Emotionally Abused: No    Physically Abused: No    Sexually Abused: No   Family History  Problem Relation Age of Onset   Cancer Mother        ovaian   Hypertension Father        kidney   Stroke Father    Cancer Father    Hypertension Sister    Diabetes Brother       VITAL SIGNS BP (!) 151/63   Pulse 65   Temp 97.9 F (36.6 C)   Resp 20   Ht '5\' 8"'$  (1.727 m)   Wt 183 lb (83 kg)   SpO2 94%   BMI 27.83 kg/m   Outpatient Encounter Medications as of 04/16/2022  Medication Sig   acetaminophen (ACETAMINOPHEN EXTRA STRENGTH) 500 MG tablet Take 1 tablet (500 mg total) by mouth every 6 (six) hours as needed.   ALLERGY RELIEF 10 MG tablet TAKE (1) TABLET BY MOUTH ONCE DAILY.   ALPRAZolam (XANAX) 0.25 MG tablet Take 1 tablet (0.25 mg total) by mouth 2 (two) times daily as needed for up to 13 days for anxiety.   Amino Acids-Protein Hydrolys (PRO-STAT) LIQD Take 30 mLs by mouth 3 (three) times daily. Sugar-free   amLODipine (NORVASC) 10 MG tablet Take 1 tablet (10 mg total) by mouth daily.   ANTI-DIARRHEAL 2 MG tablet TAKE 1 TABLET BY MOUTH 3 TIMES A DAY AS NEEDED FOR DIARRHEA.   aspirin EC 81 MG tablet Take 1 tablet (81 mg total) by mouth in the morning and at bedtime. Swallow whole.   benzonatate (TESSALON) 100 MG capsule Take 1 capsule (100 mg total) by mouth 3 (three) times daily as needed for cough. (Patient taking differently: Take 100 mg by mouth every 8 (eight) hours as needed for cough.)   beta carotene w/minerals (OCUVITE) tablet TAKE 1 TABLET BY MOUTH AT BEDTIME.   bisacodyl (DULCOLAX) 5 MG EC tablet Take 1 tablet (5 mg total) by mouth daily as needed for moderate constipation.   Carboxymethylcellulose  Sodium (THERATEARS) 0.25 % SOLN Apply 1 drop to eye in the morning and at bedtime.   Cyanocobalamin (VITAMIN B-12) 2500 MCG SUBL TAKE 1/2 TABLET (1250MCG) BY MOUTH AT BEDTIME.   dicyclomine (BENTYL) 10 MG capsule 1 bid prn abd cramps and loose stool   EASYMAX TEST test strip CHECK BLOOD SUGAR ONCE DAILY.(CALL MD IF BS BELOW 60: OR IF BS ABOVE 400) (Patient not taking: Reported  on 03/26/2022)   famotidine (PEPCID) 20 MG tablet TAKE 1 TABLET BY MOUTH EACH MORNING.   gabapentin (NEURONTIN) 100 MG capsule Take 3 capsules (300 mg total) by mouth 2 (two) times daily.   GERI-TUSSIN DM 10-100 MG/5ML liquid Take 5 mLs by mouth every 4 (four) hours as needed for cough.   GOODSENSE HEMORRHOIDAL 0.25-14-74.9 % rectal ointment Place 1 Application rectally 3 (three) times daily as needed for irritation.   hydrALAZINE (APRESOLINE) 25 MG tablet Take 1 tablet (25 mg total) by mouth 2 (two) times daily.   HYDROcodone-acetaminophen (NORCO/VICODIN) 5-325 MG tablet Take 1 tablet by mouth every 6 (six) hours as needed for up to 6 days for moderate pain or severe pain (pain score 4-6).   hydrOXYzine (ATARAX) 25 MG tablet TAKE 1 TABLET BY MOUTH 3 TIMES A DAY AS NEEDED FOR ITCHING.   Ipratropium-Albuterol (COMBIVENT RESPIMAT) 20-100 MCG/ACT AERS respimat Inhale 1 puff into the lungs every 6 (six) hours as needed for wheezing or shortness of breath.   losartan (COZAAR) 100 MG tablet Take 1 tablet daily   metFORMIN (GLUCOPHAGE) 500 MG tablet Take one tablet '500mg'$  po in the morning and 1/2 tablet 250 mg po at supper   methocarbamol (ROBAXIN) 500 MG tablet Take 1 tablet (500 mg total) by mouth every 6 (six) hours as needed for muscle spasms.   Multiple Vitamins-Minerals (PRESERVISION AREDS 2) CAPS Take 1 capsule by mouth in the morning and at bedtime.   Omega-3 Fatty Acids (FISH OIL) 1000 MG CAPS One capsule BID   ondansetron (ZOFRAN) 4 MG tablet Take 1 tablet (4 mg total) by mouth every 6 (six) hours as needed for nausea.    pantoprazole (PROTONIX) 20 MG tablet TAKE (1) TABLET BY MOUTH ONCE DAILY.   traMADol (ULTRAM) 50 MG tablet Take 1 tablet (50 mg total) by mouth every 6 (six) hours as needed.   Vitamin D, Cholecalciferol, 10 MCG (400 UNIT) TABS TAKE 1 TABLET BY MOUTH ONCE DAILY.   No facility-administered encounter medications on file as of 04/16/2022.     SIGNIFICANT DIAGNOSTIC EXAMS   PREVIOUS   02-17-22: chest x-ray: 1. Right middle lobe airspace disease which may reflect atelectasis versus pneumonia  02-22-22: chest x-ray:  1. Progressive right middle lobe atelectasis/consolidation. 2. Increasing streaky left basilar opacities with small left pleural effusion, could reflect atelectasis or developing infection.  NO NEW EXAMS   LABS  REVIEWED: PREVIOUS   11-27-21: hgb a1c 7.0; chol 188; ldl 93; trig 324; hdl 41 02-17-22: wbc 15.1; hgb 11.6; hct 34.8; mcv 86.4 plt 293; glucose 169; bun 12; creat 0.72; k+ 4.3; na++ 123; ca 8.7; gfr >60 02-18-22: wbc 13.1; hgb 9.7; hct 29.3 mcv 87.5 plt 257; glucose 135; bun 12; creat 0.70; k+ 3.9; na++ 126; ca 7.9; gfr >60; protein 5.9; albumin 2.3; mag 1.5 tsh 1.342 02-23-22: wbc 13.6; hgb 10.6; hct 32.6; mcv 87.4 plt 428; glucose 206; bun 17; creat 0.68; k+ 4.0; na++ 135; ca 9.1; gfr >60; mag 1.9   TODAY  03-29-22: glucose 146; bun 15; creat 0.85; k+ 4.3; na++ 137; ca 9.4 gfr >60 protein 6.7; albumin 3.7; hgb a1c 6.7  04-10-22; wbc; hgb 11.2; hct 36.3; mcv 89.0 plt 266    Review of Systems  Constitutional:  Negative for malaise/fatigue.  Respiratory:  Negative for cough and shortness of breath.   Cardiovascular:  Negative for chest pain, palpitations and leg swelling.  Gastrointestinal:  Negative for abdominal pain, constipation and heartburn.  Musculoskeletal:  Negative for  back pain, joint pain and myalgias.  Skin: Negative.   Neurological:  Negative for dizziness.  Psychiatric/Behavioral:  The patient is not nervous/anxious.    Physical Exam Constitutional:       General: She is not in acute distress.    Appearance: She is well-developed. She is not diaphoretic.  Neck:     Thyroid: No thyromegaly.  Cardiovascular:     Rate and Rhythm: Normal rate and regular rhythm.     Pulses: Normal pulses.     Heart sounds: Normal heart sounds.  Pulmonary:     Effort: Pulmonary effort is normal. No respiratory distress.     Breath sounds: Normal breath sounds.  Abdominal:     General: Bowel sounds are normal. There is no distension.     Palpations: Abdomen is soft.     Tenderness: There is no abdominal tenderness.  Musculoskeletal:     Cervical back: Neck supple.     Right lower leg: No edema.     Left lower leg: No edema.     Comments: Right leg in splint incision line with mild erythremia   Lymphadenopathy:     Cervical: No cervical adenopathy.  Skin:    General: Skin is warm and dry.  Neurological:     Mental Status: She is alert. Mental status is at baseline.  Psychiatric:        Mood and Affect: Mood normal.     ASSESSMENT/ PLAN:  TODAY  Primary osteoarthritis right knee: is status post right knee arthroplasty: will continue therapy as directed to improve upon her level of independence with her adls. Has vicodin 5/325 mg every 6 hours as needed for 6 more days. Is on asa 81 mg twice daily; robaxin 500 mg every 6 hours as needed   2. Aortic atherosclerosis (ct 02-25-21) unable to tolerate statins  3. Hypertension associated with type 2 diabetes mellitus; b/p 151/63 will continue cozaar 100 mg daily apresoline 25 mg twice daily norvasc 10 mg daily   4. Type 2 diabetes mellitus with atherosclerosis of aorta hgb a1c 6.7 will continue metformin 500 mg in the AM and 250 mg in the PM  5. Chronic non-seasonal allergic rhinitis: will continue claritin 10 mg daily   6. Gastroesophageal reflux disease without esophagitis: will continue pepcid 20 mg daily and protonix 20 mg daily   7. Hyperlipidemia associated with type 2 diabetes mellitus: is  unable to tolerate statins  8. Cognitive dysfunction: will monitor   9. Chronic anxiety: will continue xanax 0.25 mg twice daily as needed for 13 days  10. Severe protein calorie malnutrition: albumin 3.7 will continue prostat 30 mL three times daily  11. Diabetic peripheral neuropathy associated with type 2 diabetes mellitus: will continue gabapentin 300 mg twice daily    Ok Edwards NP [facility]  call [phone]

## 2022-04-22 ENCOUNTER — Encounter: Payer: Self-pay | Admitting: Adult Health

## 2022-04-24 DIAGNOSIS — Z4789 Encounter for other orthopedic aftercare: Secondary | ICD-10-CM | POA: Diagnosis not present

## 2022-04-25 ENCOUNTER — Telehealth: Payer: Self-pay | Admitting: Family Medicine

## 2022-04-25 NOTE — Telephone Encounter (Signed)
Patient wanted Dr. Nicki Reaper to know that she received her covid shot yesterday. She  also said that she has had her surgery and she is getting along well.  CB# [phone]

## 2022-05-03 ENCOUNTER — Non-Acute Institutional Stay (SKILLED_NURSING_FACILITY): Payer: Medicare Other | Admitting: Adult Health

## 2022-05-03 ENCOUNTER — Encounter: Payer: Self-pay | Admitting: Adult Health

## 2022-05-03 DIAGNOSIS — I152 Hypertension secondary to endocrine disorders: Secondary | ICD-10-CM

## 2022-05-03 DIAGNOSIS — E1159 Type 2 diabetes mellitus with other circulatory complications: Secondary | ICD-10-CM | POA: Diagnosis not present

## 2022-05-03 DIAGNOSIS — I700 Atherosclerosis of aorta: Secondary | ICD-10-CM | POA: Diagnosis not present

## 2022-05-03 DIAGNOSIS — E43 Unspecified severe protein-calorie malnutrition: Secondary | ICD-10-CM | POA: Diagnosis not present

## 2022-05-03 NOTE — Progress Notes (Signed)
Location:  [place] Room Number: 160-P Place of Service:  SNF (31)   CODE STATUS: DNR  Allergies  Allergen Reactions   Hydrocortisone Itching   Lisinopril     Other reaction(s): Lethargy (intolerance)   Phenergan [Promethazine Hcl] Other (See Comments)    hallucinations   Statins Other (See Comments)    Severe myalgias   Trazodone And Nefazodone Cough   Prednisone Rash    Chief Complaint  Patient presents with   Acute Visit    Care plan meeting    HPI:  We have come together for her care plan meeting.  BIMS 15/15 mood 5/30: not sleeping well; decreased energy nervous at times. She is out of bed ad lib with no falls. She requires set up to supervision for her adls. She is occasionally incontinent of bladder continent of bowel. Dietary:  weight is 173 pounds; on regular diet; appetite 50-100% . Therapy: moderate independent with adls; moderate independent with ambulation with walker. She will return back her assisted living.. Activities: little participation. She continues to be followed for her chronic illnesses including:  Aortic atherosclerosis  Hypertension associated with type 2 diabetes mellitus  Severe protein calorie malnutrition  Past Medical History:  Diagnosis Date   Cognitive dysfunction 03/04/2019   Patient scores 21 out of 30 on [place] cognitive assessment September 2020   Diabetes mellitus without complication ([place])    Diabetic peripheral neuropathy associated with type 2 diabetes mellitus ([place]) 02/25/2022   Diverticulitis    Frailty 03/04/2019   H/O bilateral breast reduction surgery    Hyperlipidemia    a. intolerant to statins.    Hypertension     Past Surgical History:  Procedure Laterality Date   ABDOMINAL HYSTERECTOMY  2003   APPENDECTOMY     age 40   BREAST REDUCTION SURGERY     age 15   COLON SURGERY Left 2011   Hemicolectomy due to diverticulitis   EYE SURGERY  03/30/2009   cataract   KNEE SURGERY Right 2003   knee  cap   LAPAROSCOPIC INCISIONAL / UMBILICAL / VENTRAL HERNIA REPAIR  02/23/2007   REDUCTION MAMMAPLASTY Bilateral 2001   REFRACTIVE SURGERY     TOTAL KNEE ARTHROPLASTY Right 04/10/2022   Procedure: TOTAL KNEE ARTHROPLASTY;  Surgeon: Sydnee Cabal, MD;  Location: [facility];  Service: Orthopedics;  Laterality: Right;  adductor canal    Social History   Socioeconomic History   Marital status: Widowed    Spouse name: Not on file   Number of children: Not on file   Years of education: Not on file   Highest education level: Not on file  Occupational History   Not on file  Tobacco Use   Smoking status: Never   Smokeless tobacco: Never  Vaping Use   Vaping Use: Never used  Substance and Sexual Activity   Alcohol use: No   Drug use: No   Sexual activity: Never    Birth control/protection: Abstinence  Other Topics Concern   Not on file  Social History Narrative   Not on file   Social Determinants of Health   Financial Resource Strain: Low Risk  (08/28/2021)   Overall Financial Resource Strain (CARDIA)    Difficulty of Paying Living Expenses: Not hard at all  Food Insecurity: No Food Insecurity (04/10/2022)   Hunger Vital Sign    Worried About Running Out of Food in the Last Year: Never true    [place] in the Last Year:  Never true  Transportation Needs: No Transportation Needs (04/10/2022)   PRAPARE - Hydrologist (Medical): No    Lack of Transportation (Non-Medical): No  Physical Activity: Sufficiently Active (08/28/2021)   Exercise Vital Sign    Days of Exercise per Week: 5 days    Minutes of Exercise per Session: 30 min  Stress: No Stress Concern Present (08/28/2021)   [place]    Feeling of Stress : Not at all  Social Connections: Moderately Integrated (08/28/2021)   Social Connection and Isolation Panel [NHANES]    Frequency of Communication with Friends and Family: More  than three times a week    Frequency of Social Gatherings with Friends and Family: More than three times a week    Attends Religious Services: More than 4 times per year    Active Member of [employer] or Organizations: Yes    Attends Archivist Meetings: More than 4 times per year    Marital Status: Widowed  Intimate Partner Violence: Not At Risk (04/10/2022)   Humiliation, Afraid, Rape, and Kick questionnaire    Fear of Current or Ex-Partner: No    Emotionally Abused: No    Physically Abused: No    Sexually Abused: No   Family History  Problem Relation Age of Onset   Cancer Mother        ovaian   Hypertension Father        kidney   Stroke Father    Cancer Father    Hypertension Sister    Diabetes Brother       VITAL SIGNS BP 131/74   Pulse 63   Temp (!) 97.4 F (36.3 C)   Resp 20   Ht '5\' 8"'$  (1.727 m)   Wt 173 lb (78.5 kg)   SpO2 97%   BMI 26.30 kg/m   Outpatient Encounter Medications as of 05/03/2022  Medication Sig   acetaminophen (TYLENOL) 325 MG tablet Take 650 mg by mouth every 6 (six) hours as needed.   ALLERGY RELIEF 10 MG tablet TAKE (1) TABLET BY MOUTH ONCE DAILY.   Amino Acids-Protein Hydrolys (PRO-STAT) LIQD Take 30 mLs by mouth 3 (three) times daily. Sugar-free   amLODipine (NORVASC) 10 MG tablet Take 1 tablet (10 mg total) by mouth daily.   aspirin EC 81 MG tablet Take 1 tablet (81 mg total) by mouth in the morning and at bedtime. Swallow whole.   benzonatate (TESSALON) 100 MG capsule Take 1 capsule (100 mg total) by mouth 3 (three) times daily as needed for cough.   beta carotene w/minerals (OCUVITE) tablet TAKE 1 TABLET BY MOUTH AT BEDTIME.   bisacodyl (DULCOLAX) 5 MG EC tablet Take 1 tablet (5 mg total) by mouth daily as needed for moderate constipation.   Carboxymethylcellulose Sodium (THERATEARS) 0.25 % SOLN Apply 1 drop to eye in the morning and at bedtime.   Cyanocobalamin (VITAMIN B-12) 2500 MCG SUBL TAKE 1/2 TABLET (1250MCG) BY MOUTH AT  BEDTIME.   dicyclomine (BENTYL) 10 MG capsule 1 bid prn abd cramps and loose stool   famotidine (PEPCID) 20 MG tablet TAKE 1 TABLET BY MOUTH EACH MORNING.   gabapentin (NEURONTIN) 100 MG capsule Take 3 capsules (300 mg total) by mouth 2 (two) times daily.   GOODSENSE HEMORRHOIDAL 0.25-14-74.9 % rectal ointment Place 1 Application rectally 3 (three) times daily as needed for irritation.   Ipratropium-Albuterol (COMBIVENT RESPIMAT) 20-100 MCG/ACT AERS respimat Inhale 1 puff into the  lungs every 6 (six) hours as needed for wheezing or shortness of breath.   losartan (COZAAR) 100 MG tablet Take 1 tablet daily   metFORMIN (GLUCOPHAGE) 500 MG tablet Take one tablet '500mg'$  po in the morning and 1/2 tablet 250 mg po at supper   methocarbamol (ROBAXIN) 500 MG tablet Take 1 tablet (500 mg total) by mouth every 6 (six) hours as needed for muscle spasms.   Multiple Vitamins-Minerals (PRESERVISION AREDS 2) CAPS Take 1 capsule by mouth in the morning and at bedtime.   Omega-3 Fatty Acids (FISH OIL) 1000 MG CAPS One capsule BID   pantoprazole (PROTONIX) 20 MG tablet TAKE (1) TABLET BY MOUTH ONCE DAILY.   Vitamin D, Cholecalciferol, 10 MCG (400 UNIT) TABS TAKE 1 TABLET BY MOUTH ONCE DAILY.   ANTI-DIARRHEAL 2 MG tablet TAKE 1 TABLET BY MOUTH 3 TIMES A DAY AS NEEDED FOR DIARRHEA. (Patient not taking: Reported on 05/03/2022)   EASYMAX TEST test strip CHECK BLOOD SUGAR ONCE DAILY.(CALL MD IF BS BELOW 60: OR IF BS ABOVE 400) (Patient not taking: Reported on 03/26/2022)   GERI-TUSSIN DM 10-100 MG/5ML liquid Take 5 mLs by mouth every 4 (four) hours as needed for cough. (Patient not taking: Reported on 05/03/2022)   hydrALAZINE (APRESOLINE) 25 MG tablet Take 1 tablet (25 mg total) by mouth 2 (two) times daily.   hydrOXYzine (ATARAX) 25 MG tablet TAKE 1 TABLET BY MOUTH 3 TIMES A DAY AS NEEDED FOR ITCHING. (Patient not taking: Reported on 05/03/2022)   ondansetron (ZOFRAN) 4 MG tablet Take 1 tablet (4 mg total) by mouth every 6  (six) hours as needed for nausea. (Patient not taking: Reported on 05/03/2022)   traMADol (ULTRAM) 50 MG tablet Take 1 tablet (50 mg total) by mouth every 6 (six) hours as needed. (Patient not taking: Reported on 05/03/2022)   [DISCONTINUED] acetaminophen (ACETAMINOPHEN EXTRA STRENGTH) 500 MG tablet Take 1 tablet (500 mg total) by mouth every 6 (six) hours as needed.   No facility-administered encounter medications on file as of 05/03/2022.     SIGNIFICANT DIAGNOSTIC EXAMS  PREVIOUS   02-17-22: chest x-ray: 1. Right middle lobe airspace disease which may reflect atelectasis versus pneumonia  02-22-22: chest x-ray:  1. Progressive right middle lobe atelectasis/consolidation. 2. Increasing streaky left basilar opacities with small left pleural effusion, could reflect atelectasis or developing infection.  NO NEW EXAMS   LABS  REVIEWED: PREVIOUS   11-27-21: hgb a1c 7.0; chol 188; ldl 93; trig 324; hdl 41 02-17-22: wbc 15.1; hgb 11.6; hct 34.8; mcv 86.4 plt 293; glucose 169; bun 12; creat 0.72; k+ 4.3; na++ 123; ca 8.7; gfr >60 02-18-22: wbc 13.1; hgb 9.7; hct 29.3 mcv 87.5 plt 257; glucose 135; bun 12; creat 0.70; k+ 3.9; na++ 126; ca 7.9; gfr >60; protein 5.9; albumin 2.3; mag 1.5 tsh 1.342 02-23-22: wbc 13.6; hgb 10.6; hct 32.6; mcv 87.4 plt 428; glucose 206; bun 17; creat 0.68; k+ 4.0; na++ 135; ca 9.1; gfr >60; mag 1.9  03-29-22: glucose 146; bun 15; creat 0.85; k+ 4.3; na++ 137; ca 9.4 gfr >60 protein 6.7; albumin 3.7; hgb a1c 6.7  04-10-22; wbc; hgb 11.2; hct 36.3; mcv 89.0 plt 266  NO NEW LABS.     Review of Systems  Constitutional:  Negative for malaise/fatigue.  Respiratory:  Negative for cough and shortness of breath.   Cardiovascular:  Negative for chest pain, palpitations and leg swelling.  Gastrointestinal:  Negative for abdominal pain, constipation and heartburn.  Musculoskeletal:  Negative for  back pain, joint pain and myalgias.  Skin: Negative.   Neurological:  Negative for  dizziness.  Psychiatric/Behavioral:  The patient is not nervous/anxious.    Physical Exam Constitutional:      General: She is not in acute distress.    Appearance: She is well-developed. She is not diaphoretic.  Neck:     Thyroid: No thyromegaly.  Cardiovascular:     Rate and Rhythm: Normal rate and regular rhythm.     Pulses: Normal pulses.     Heart sounds: Normal heart sounds.  Pulmonary:     Effort: Pulmonary effort is normal. No respiratory distress.     Breath sounds: Normal breath sounds.  Abdominal:     General: Bowel sounds are normal. There is no distension.     Palpations: Abdomen is soft.     Tenderness: There is no abdominal tenderness.  Musculoskeletal:     Cervical back: Neck supple.     Right lower leg: No edema.     Left lower leg: No edema.     Comments:  Right leg in splint  Lymphadenopathy:     Cervical: No cervical adenopathy.  Skin:    General: Skin is warm and dry.  Neurological:     Mental Status: She is alert and oriented to person, place, and time.  Psychiatric:        Mood and Affect: Mood normal.      ASSESSMENT/ PLAN:  TODAY  Aortic atherosclerosis Hypertension associated with type 2 diabetes mellitus Severe protein calorie malnutrition  Will continue current medications Will continue current plan of care Will continue to monitor her status.   Time spent with patient: 40 minutes: medications; therapy; goals of care   Ok Edwards NP [facility]  call [phone]

## 2022-05-08 ENCOUNTER — Non-Acute Institutional Stay (SKILLED_NURSING_FACILITY): Payer: Medicare Other | Admitting: Adult Health

## 2022-05-08 ENCOUNTER — Other Ambulatory Visit: Payer: Self-pay | Admitting: Adult Health

## 2022-05-08 ENCOUNTER — Encounter: Payer: Self-pay | Admitting: Adult Health

## 2022-05-08 DIAGNOSIS — K219 Gastro-esophageal reflux disease without esophagitis: Secondary | ICD-10-CM

## 2022-05-08 DIAGNOSIS — Z96651 Presence of right artificial knee joint: Secondary | ICD-10-CM | POA: Diagnosis not present

## 2022-05-08 DIAGNOSIS — E119 Type 2 diabetes mellitus without complications: Secondary | ICD-10-CM

## 2022-05-08 DIAGNOSIS — I700 Atherosclerosis of aorta: Secondary | ICD-10-CM

## 2022-05-08 DIAGNOSIS — I10 Essential (primary) hypertension: Secondary | ICD-10-CM

## 2022-05-08 DIAGNOSIS — M1711 Unilateral primary osteoarthritis, right knee: Secondary | ICD-10-CM

## 2022-05-08 DIAGNOSIS — E1142 Type 2 diabetes mellitus with diabetic polyneuropathy: Secondary | ICD-10-CM

## 2022-05-08 MED ORDER — GABAPENTIN 100 MG PO CAPS: 300.0000 mg | ORAL_CAPSULE | Freq: Two times a day (BID) | ORAL | 0 refills | Status: DC

## 2022-05-08 MED ORDER — METFORMIN HCL 500 MG PO TABS: ORAL_TABLET | ORAL | 0 refills | Status: DC

## 2022-05-08 MED ORDER — LOSARTAN POTASSIUM 100 MG PO TABS: ORAL_TABLET | ORAL | 0 refills | Status: DC

## 2022-05-08 MED ORDER — PANTOPRAZOLE SODIUM 20 MG PO TBEC: DELAYED_RELEASE_TABLET | ORAL | 0 refills | Status: DC

## 2022-05-08 MED ORDER — AMLODIPINE BESYLATE 10 MG PO TABS: 10.0000 mg | ORAL_TABLET | Freq: Every day | ORAL | 0 refills | Status: DC

## 2022-05-08 MED ORDER — BENZONATATE 100 MG PO CAPS: 100.0000 mg | ORAL_CAPSULE | Freq: Three times a day (TID) | ORAL | 0 refills | Status: DC | PRN

## 2022-05-08 MED ORDER — COMBIVENT RESPIMAT 20-100 MCG/ACT IN AERS: 1.0000 | INHALATION_SPRAY | Freq: Four times a day (QID) | RESPIRATORY_TRACT | 0 refills | Status: DC | PRN

## 2022-05-08 MED ORDER — DICYCLOMINE HCL 10 MG PO CAPS: ORAL_CAPSULE | ORAL | 0 refills | Status: DC

## 2022-05-08 NOTE — Progress Notes (Signed)
Location:  [facility] Room Number: [phone] Place of Service:  SNF (31)  Provider: Ok Edwards np   PCP: Kathyrn Drown, MD Patient Care Team: Kathyrn Drown, MD as PCP - General (Family Medicine) Josue Hector, MD as PCP - Cardiology (Cardiology)  Extended Emergency Contact Information Primary Emergency Contact: Torian, Thoennes (DIL) Home Phone: [phone] Mobile Phone: [phone] Relation: Daughter Secondary Emergency Contact: Lauris Chroman Address: [address] [place]          [place], [place] [address] [place] Phone: [phone] Mobile Phone: [phone] Relation: Son  Code Status: full  Goals of care:  Advanced Directive information    05/08/2022   10:46 AM  Advanced Directives  Does Patient Have a Medical Advance Directive? Yes  Type of Advance Directive Out of facility DNR (pink MOST or yellow form);Living will  Does patient want to make changes to medical advance directive? No - Patient declined     Allergies  Allergen Reactions   Hydrocortisone Itching   Lisinopril     Other reaction(s): Lethargy (intolerance)   Phenergan [Promethazine Hcl] Other (See Comments)    hallucinations   Statins Other (See Comments)    Severe myalgias   Trazodone And Nefazodone Cough   Prednisone Rash    Chief Complaint  Patient presents with   Discharge Note    Discharge    HPI:  88 y.o. [name]  is being discharged to home with home health for pt/ot. She will need her medications prescribed and will need to follow up with her medical provider. She had been hospitalized for right knee arthoplasty with tendon repair. She was admitted to this facility for short term rehab. She has participated in therapy to improve upon her independence with her adls. She now ready to return back home.     Past Medical History:  Diagnosis Date   Cognitive dysfunction 03/04/2019   Patient scores 21 out of 30 on [place] cognitive assessment September  2020   Diabetes mellitus without complication [facility])    Diabetic peripheral neuropathy associated with type 2 diabetes mellitus ([place]) 02/25/2022   Diverticulitis    Frailty 03/04/2019   H/O bilateral breast reduction surgery    Hyperlipidemia    a. intolerant to statins.    Hypertension     Past Surgical History:  Procedure Laterality Date   ABDOMINAL HYSTERECTOMY  2003   APPENDECTOMY     age 65   BREAST REDUCTION SURGERY     age 77   COLON SURGERY Left 2011   Hemicolectomy due to diverticulitis   EYE SURGERY  03/30/2009   cataract   KNEE SURGERY Right 2003   knee cap   LAPAROSCOPIC INCISIONAL / UMBILICAL / VENTRAL HERNIA REPAIR  02/23/2007   REDUCTION MAMMAPLASTY Bilateral 2001   REFRACTIVE SURGERY     TOTAL KNEE ARTHROPLASTY Right 04/10/2022   Procedure: TOTAL KNEE ARTHROPLASTY;  Surgeon: Sydnee Cabal, MD;  Location: [facility];  Service: Orthopedics;  Laterality: Right;  adductor canal      reports that she has never smoked. She has never used smokeless tobacco. She reports that she does not drink alcohol and does not use drugs. Social History   Socioeconomic History   Marital status: Widowed    Spouse name: Not on file   Number of children: Not on file   Years of education: Not on file   Highest education level: Not on file  Occupational History   Not on file  Tobacco  Use   Smoking status: Never   Smokeless tobacco: Never  Vaping Use   Vaping Use: Never used  Substance and Sexual Activity   Alcohol use: No   Drug use: No   Sexual activity: Never    Birth control/protection: Abstinence  Other Topics Concern   Not on file  Social History Narrative   Not on file   Social Determinants of Health   Financial Resource Strain: Low Risk  (08/28/2021)   Overall Financial Resource Strain (CARDIA)    Difficulty of Paying Living Expenses: Not hard at all  Food Insecurity: No Food Insecurity (04/10/2022)   Hunger Vital Sign    Worried About Running Out of Food in the  Last Year: Never true    Ran Out of Food in the Last Year: Never true  Transportation Needs: No Transportation Needs (04/10/2022)   PRAPARE - Hydrologist (Medical): No    Lack of Transportation (Non-Medical): No  Physical Activity: Sufficiently Active (08/28/2021)   Exercise Vital Sign    Days of Exercise per Week: 5 days    Minutes of Exercise per Session: 30 min  Stress: No Stress Concern Present (08/28/2021)   [place]    Feeling of Stress : Not at all  Social Connections: Moderately Integrated (08/28/2021)   Social Connection and Isolation Panel [NHANES]    Frequency of Communication with Friends and Family: More than three times a week    Frequency of Social Gatherings with Friends and Family: More than three times a week    Attends Religious Services: More than 4 times per year    Active Member of [employer] or Organizations: Yes    Attends Archivist Meetings: More than 4 times per year    Marital Status: Widowed  Intimate Partner Violence: Not At Risk (04/10/2022)   Humiliation, Afraid, Rape, and Kick questionnaire    Fear of Current or Ex-Partner: No    Emotionally Abused: No    Physically Abused: No    Sexually Abused: No   Functional Status Survey:    Allergies  Allergen Reactions   Hydrocortisone Itching   Lisinopril     Other reaction(s): Lethargy (intolerance)   Phenergan [Promethazine Hcl] Other (See Comments)    hallucinations   Statins Other (See Comments)    Severe myalgias   Trazodone And Nefazodone Cough   Prednisone Rash    Pertinent  Health Maintenance Due  Topic Date Due   OPHTHALMOLOGY EXAM  05/21/2022   HEMOGLOBIN A1C  09/27/2022   FOOT EXAM  12/14/2022   INFLUENZA VACCINE  Completed   DEXA SCAN  Completed    Medications: Outpatient Encounter Medications as of 05/08/2022  Medication Sig   acetaminophen (TYLENOL) 325 MG tablet Take 650 mg by  mouth every 6 (six) hours as needed.   ALLERGY RELIEF 10 MG tablet TAKE (1) TABLET BY MOUTH ONCE DAILY.   Amino Acids-Protein Hydrolys (PRO-STAT) LIQD Take 30 mLs by mouth 3 (three) times daily. Sugar-free   amLODipine (NORVASC) 10 MG tablet Take 1 tablet (10 mg total) by mouth daily.   aspirin EC 81 MG tablet Take 1 tablet (81 mg total) by mouth in the morning and at bedtime. Swallow whole.   benzonatate (TESSALON) 100 MG capsule Take 1 capsule (100 mg total) by mouth 3 (three) times daily as needed for cough.   beta carotene w/minerals (OCUVITE) tablet TAKE 1 TABLET BY MOUTH AT BEDTIME.  bisacodyl (DULCOLAX) 5 MG EC tablet Take 1 tablet (5 mg total) by mouth daily as needed for moderate constipation.   Carboxymethylcellulose Sodium (THERATEARS) 0.25 % SOLN Apply 1 drop to eye in the morning and at bedtime.   cyanocobalamin 1000 MCG tablet Take 1,000 mcg by mouth at bedtime.   famotidine (PEPCID) 20 MG tablet TAKE 1 TABLET BY MOUTH EACH MORNING.   gabapentin (NEURONTIN) 100 MG capsule Take 3 capsules (300 mg total) by mouth 2 (two) times daily.   GOODSENSE HEMORRHOIDAL 0.25-14-74.9 % rectal ointment Place 1 Application rectally 3 (three) times daily as needed for irritation.   Ipratropium-Albuterol (COMBIVENT RESPIMAT) 20-100 MCG/ACT AERS respimat Inhale 1 puff into the lungs every 6 (six) hours as needed for wheezing or shortness of breath.   losartan (COZAAR) 100 MG tablet Take 1 tablet daily   melatonin 3 MG TABS tablet Take 3 mg by mouth at bedtime.   metFORMIN (GLUCOPHAGE) 500 MG tablet Take one tablet '500mg'$  po in the morning and 1/2 tablet 250 mg po at supper   methocarbamol (ROBAXIN) 500 MG tablet Take 1 tablet (500 mg total) by mouth every 6 (six) hours as needed for muscle spasms.   Multiple Vitamins-Minerals (PRESERVISION AREDS 2) CAPS Take 1 capsule by mouth in the morning and at bedtime.   Omega-3 Fatty Acids (FISH OIL) 1000 MG CAPS One capsule BID   pantoprazole (PROTONIX) 20 MG  tablet TAKE (1) TABLET BY MOUTH ONCE DAILY.   Vitamin D, Cholecalciferol, 10 MCG (400 UNIT) TABS TAKE 1 TABLET BY MOUTH ONCE DAILY.   No facility-administered encounter medications on file as of 05/08/2022.     Vitals:   05/08/22 1038  BP: (!) 140/65  Pulse: 66  Resp: 20  Temp: (!) 97.5 F (36.4 C)  SpO2: 98%  Weight: 171 lb 3.2 oz (77.7 kg)  Height: '5\' 8"'$  (1.727 m)   Body mass index is 26.03 kg/m.   SIGNIFICANT DIAGNOSTIC EXAMS  PREVIOUS   02-17-22: chest x-ray: 1. Right middle lobe airspace disease which may reflect atelectasis versus pneumonia  02-22-22: chest x-ray:  1. Progressive right middle lobe atelectasis/consolidation. 2. Increasing streaky left basilar opacities with small left pleural effusion, could reflect atelectasis or developing infection.  NO NEW EXAMS   LABS  REVIEWED: PREVIOUS   11-27-21: hgb a1c 7.0; chol 188; ldl 93; trig 324; hdl 41 02-17-22: wbc 15.1; hgb 11.6; hct 34.8; mcv 86.4 plt 293; glucose 169; bun 12; creat 0.72; k+ 4.3; na++ 123; ca 8.7; gfr >60 02-18-22: wbc 13.1; hgb 9.7; hct 29.3 mcv 87.5 plt 257; glucose 135; bun 12; creat 0.70; k+ 3.9; na++ 126; ca 7.9; gfr >60; protein 5.9; albumin 2.3; mag 1.5 tsh 1.342 02-23-22: wbc 13.6; hgb 10.6; hct 32.6; mcv 87.4 plt 428; glucose 206; bun 17; creat 0.68; k+ 4.0; na++ 135; ca 9.1; gfr >60; mag 1.9  03-29-22: glucose 146; bun 15; creat 0.85; k+ 4.3; na++ 137; ca 9.4 gfr >60 protein 6.7; albumin 3.7; hgb a1c 6.7  04-10-22; wbc; hgb 11.2; hct 36.3; mcv 89.0 plt 266  NO NEW LABS.    Review of Systems  Constitutional:  Negative for malaise/fatigue.  Respiratory:  Negative for cough and shortness of breath.   Cardiovascular:  Negative for chest pain, palpitations and leg swelling.  Gastrointestinal:  Negative for abdominal pain, constipation and heartburn.  Musculoskeletal:  Negative for back pain, joint pain and myalgias.  Skin: Negative.   Neurological:  Negative for dizziness.   Psychiatric/Behavioral:  The patient  is not nervous/anxious.    Physical Exam Constitutional:      General: She is not in acute distress.    Appearance: She is well-developed. She is not diaphoretic.  Neck:     Thyroid: No thyromegaly.  Cardiovascular:     Rate and Rhythm: Normal rate and regular rhythm.     Pulses: Normal pulses.     Heart sounds: Normal heart sounds.  Pulmonary:     Effort: Pulmonary effort is normal. No respiratory distress.     Breath sounds: Normal breath sounds.  Abdominal:     General: Bowel sounds are normal. There is no distension.     Palpations: Abdomen is soft.     Tenderness: There is no abdominal tenderness.  Musculoskeletal:     Cervical back: Neck supple.     Right lower leg: No edema.     Left lower leg: No edema.     Comments: Right leg in cast  Lymphadenopathy:     Cervical: No cervical adenopathy.  Skin:    General: Skin is warm and dry.  Neurological:     Mental Status: She is alert and oriented to person, place, and time.  Psychiatric:        Mood and Affect: Mood normal.       Assessment/Plan:    Patient is being discharged with the following home health services:  outpatient pt/ot to evaluate and treat as indicated for gait balance strength adl training   Patient is being discharged with the following durable medical equipment:  none needed   Patient has been advised to f/u with their PCP in 1-2 weeks to for a transitions of care visit.  Social services at their facility was responsible for arranging this appointment.  Pt was provided with adequate prescriptions of noncontrolled medications to reach the scheduled appointment .  For controlled substances, a limited supply was provided as appropriate for the individual patient.  If the pt normally receives these medications from a pain clinic or has a contract with another physician, these medications should be received from that clinic or physician only).    A 30 day supply of her  prescription medications have been sent to [facility]  Time spent with patient: 35 minutes: medications; therapy needs and dme.

## 2022-05-13 DIAGNOSIS — M6281 Muscle weakness (generalized): Secondary | ICD-10-CM | POA: Diagnosis not present

## 2022-05-14 DIAGNOSIS — M6281 Muscle weakness (generalized): Secondary | ICD-10-CM | POA: Diagnosis not present

## 2022-05-15 DIAGNOSIS — R278 Other lack of coordination: Secondary | ICD-10-CM | POA: Diagnosis not present

## 2022-05-16 DIAGNOSIS — M6281 Muscle weakness (generalized): Secondary | ICD-10-CM | POA: Diagnosis not present

## 2022-05-20 DIAGNOSIS — R278 Other lack of coordination: Secondary | ICD-10-CM | POA: Diagnosis not present

## 2022-05-21 DIAGNOSIS — M6281 Muscle weakness (generalized): Secondary | ICD-10-CM | POA: Diagnosis not present

## 2022-05-22 ENCOUNTER — Ambulatory Visit (INDEPENDENT_AMBULATORY_CARE_PROVIDER_SITE_OTHER): Payer: Medicare Other | Admitting: Family Medicine

## 2022-05-22 ENCOUNTER — Encounter: Payer: Self-pay | Admitting: Family Medicine

## 2022-05-22 VITALS — BP 136/72 | Wt 172.8 lb

## 2022-05-22 DIAGNOSIS — E119 Type 2 diabetes mellitus without complications: Secondary | ICD-10-CM

## 2022-05-22 DIAGNOSIS — I10 Essential (primary) hypertension: Secondary | ICD-10-CM

## 2022-05-22 MED ORDER — ALPRAZOLAM 0.25 MG PO TABS: ORAL_TABLET | ORAL | 0 refills | Status: DC

## 2022-05-22 NOTE — Progress Notes (Signed)
   Subjective:    Patient ID: [name], female    DOB: 1932-02-17, 88 y.o.   MRN: [id number]  HPI Pt arrives to follow up. Pt states it has been a month since her surgery and doing well. No issues/concerns. Pt was in [facility] from 04/10/22-05/09/22.   Would like refill on 0.25 mg Xanax.  BG 153 this morning  Denies any chest tightness pressure pain shortness of breath  Review of Systems     Objective:   Physical Exam General-in no acute distress Eyes-no discharge Lungs-respiratory rate normal, CTA CV-no murmurs,RRR Extremities skin warm dry no edema Neuro grossly normal Behavior normal, alert  Caution drowsiness with the Xanax caution falls      Assessment & Plan:  Patient getting over her knee Diabetes good control Check lab work in the early January with follow-up mid January Patient relates a severe amount of anxiety since going back to the nursing home Denies being depressed She is requesting for [place] to use the low-dose Xanax again We will use it for up to 2 weeks Recommend RSV vaccine Follow-up mid January

## 2022-05-22 NOTE — Patient Instructions (Signed)
Complaint,

## 2022-05-27 DIAGNOSIS — M6281 Muscle weakness (generalized): Secondary | ICD-10-CM | POA: Diagnosis not present

## 2022-05-28 DIAGNOSIS — M6281 Muscle weakness (generalized): Secondary | ICD-10-CM | POA: Diagnosis not present

## 2022-05-29 DIAGNOSIS — R278 Other lack of coordination: Secondary | ICD-10-CM | POA: Diagnosis not present

## 2022-05-30 ENCOUNTER — Other Ambulatory Visit: Payer: Self-pay | Admitting: Internal Medicine

## 2022-05-30 ENCOUNTER — Other Ambulatory Visit: Payer: Self-pay | Admitting: Family Medicine

## 2022-05-30 DIAGNOSIS — M6281 Muscle weakness (generalized): Secondary | ICD-10-CM | POA: Diagnosis not present

## 2022-05-30 NOTE — Telephone Encounter (Signed)
Nurses I believe it is possible that this medication was utilized after her surgery and is not intended to be a long-term medicine.  Find out from [facility] has she had 81 mg aspirin on her's medicine list on a daily basis over the past 6 months or is this medicine only been utilized since her surgery this fall?

## 2022-06-03 ENCOUNTER — Telehealth: Payer: Self-pay | Admitting: *Deleted

## 2022-06-03 DIAGNOSIS — M6281 Muscle weakness (generalized): Secondary | ICD-10-CM | POA: Diagnosis not present

## 2022-06-03 NOTE — Telephone Encounter (Signed)
1.  I am okay with the B12 supplement once daily 1000 mcg #2-I would not recommend ongoing 81 mg aspirin.  This is recommended for short span of time after surgery this far out from surgery is not recommended and can increase her risk of having bleeding issues such as bleeding ulcer therefore recommend stopping 81 mg aspirin

## 2022-06-03 NOTE — Telephone Encounter (Signed)
At this point with the patient being over her surgery and being more active I would not recommend any additional aspirin

## 2022-06-03 NOTE — Telephone Encounter (Signed)
[facility] left message that patient needs refill of ASA '81mg'$  daily and Vit B12 1097mg daily  (Neither are on med list)   Please advise

## 2022-06-03 NOTE — Telephone Encounter (Signed)
[facility] notified and stated they will contact Dr Linna Darner (prescriber) for a discontinue order

## 2022-06-04 DIAGNOSIS — M6281 Muscle weakness (generalized): Secondary | ICD-10-CM | POA: Diagnosis not present

## 2022-06-05 DIAGNOSIS — R278 Other lack of coordination: Secondary | ICD-10-CM | POA: Diagnosis not present

## 2022-06-06 DIAGNOSIS — M6281 Muscle weakness (generalized): Secondary | ICD-10-CM | POA: Diagnosis not present

## 2022-06-07 DIAGNOSIS — R278 Other lack of coordination: Secondary | ICD-10-CM | POA: Diagnosis not present

## 2022-06-10 DIAGNOSIS — M6281 Muscle weakness (generalized): Secondary | ICD-10-CM | POA: Diagnosis not present

## 2022-06-10 DIAGNOSIS — R278 Other lack of coordination: Secondary | ICD-10-CM | POA: Diagnosis not present

## 2022-06-11 DIAGNOSIS — H353113 Nonexudative age-related macular degeneration, right eye, advanced atrophic without subfoveal involvement: Secondary | ICD-10-CM | POA: Diagnosis not present

## 2022-06-11 DIAGNOSIS — H35033 Hypertensive retinopathy, bilateral: Secondary | ICD-10-CM | POA: Diagnosis not present

## 2022-06-11 DIAGNOSIS — M6281 Muscle weakness (generalized): Secondary | ICD-10-CM | POA: Diagnosis not present

## 2022-06-11 DIAGNOSIS — H43811 Vitreous degeneration, right eye: Secondary | ICD-10-CM | POA: Diagnosis not present

## 2022-06-12 DIAGNOSIS — R278 Other lack of coordination: Secondary | ICD-10-CM | POA: Diagnosis not present

## 2022-06-13 DIAGNOSIS — M6281 Muscle weakness (generalized): Secondary | ICD-10-CM | POA: Diagnosis not present

## 2022-06-16 DIAGNOSIS — R278 Other lack of coordination: Secondary | ICD-10-CM | POA: Diagnosis not present

## 2022-06-17 DIAGNOSIS — M6281 Muscle weakness (generalized): Secondary | ICD-10-CM | POA: Diagnosis not present

## 2022-06-18 DIAGNOSIS — M6281 Muscle weakness (generalized): Secondary | ICD-10-CM | POA: Diagnosis not present

## 2022-06-19 DIAGNOSIS — R278 Other lack of coordination: Secondary | ICD-10-CM | POA: Diagnosis not present

## 2022-06-20 DIAGNOSIS — B351 Tinea unguium: Secondary | ICD-10-CM | POA: Diagnosis not present

## 2022-06-20 DIAGNOSIS — L84 Corns and callosities: Secondary | ICD-10-CM | POA: Diagnosis not present

## 2022-06-20 DIAGNOSIS — E1142 Type 2 diabetes mellitus with diabetic polyneuropathy: Secondary | ICD-10-CM | POA: Diagnosis not present

## 2022-06-20 DIAGNOSIS — M6281 Muscle weakness (generalized): Secondary | ICD-10-CM | POA: Diagnosis not present

## 2022-06-21 DIAGNOSIS — R278 Other lack of coordination: Secondary | ICD-10-CM | POA: Diagnosis not present

## 2022-06-25 ENCOUNTER — Other Ambulatory Visit: Payer: Self-pay | Admitting: Family Medicine

## 2022-06-25 DIAGNOSIS — M6281 Muscle weakness (generalized): Secondary | ICD-10-CM | POA: Diagnosis not present

## 2022-06-26 ENCOUNTER — Other Ambulatory Visit: Payer: Self-pay | Admitting: Family Medicine

## 2022-06-26 DIAGNOSIS — R278 Other lack of coordination: Secondary | ICD-10-CM | POA: Diagnosis not present

## 2022-06-28 DIAGNOSIS — R278 Other lack of coordination: Secondary | ICD-10-CM | POA: Diagnosis not present

## 2022-06-30 NOTE — Telephone Encounter (Signed)
Please find out from pharmacy how long has she been on beta-carotene.  I am not convinced that this is she needs to take

## 2022-07-02 DIAGNOSIS — M6281 Muscle weakness (generalized): Secondary | ICD-10-CM | POA: Diagnosis not present

## 2022-07-03 ENCOUNTER — Other Ambulatory Visit: Payer: Self-pay

## 2022-07-03 DIAGNOSIS — R278 Other lack of coordination: Secondary | ICD-10-CM | POA: Diagnosis not present

## 2022-07-03 MED ORDER — OCUVITE-LUTEIN PO TABS: 1.0000 | ORAL_TABLET | Freq: Every day | ORAL | 12 refills | Status: DC

## 2022-07-04 DIAGNOSIS — M6281 Muscle weakness (generalized): Secondary | ICD-10-CM | POA: Diagnosis not present

## 2022-07-04 NOTE — Telephone Encounter (Signed)
[facility] states pt has been on this med since August 2020. Please advise. Thank you

## 2022-07-08 DIAGNOSIS — Z4789 Encounter for other orthopedic aftercare: Secondary | ICD-10-CM | POA: Diagnosis not present

## 2022-07-08 DIAGNOSIS — R278 Other lack of coordination: Secondary | ICD-10-CM | POA: Diagnosis not present

## 2022-07-09 DIAGNOSIS — M6281 Muscle weakness (generalized): Secondary | ICD-10-CM | POA: Diagnosis not present

## 2022-07-10 DIAGNOSIS — I10 Essential (primary) hypertension: Secondary | ICD-10-CM | POA: Diagnosis not present

## 2022-07-10 DIAGNOSIS — E119 Type 2 diabetes mellitus without complications: Secondary | ICD-10-CM | POA: Diagnosis not present

## 2022-07-10 DIAGNOSIS — R278 Other lack of coordination: Secondary | ICD-10-CM | POA: Diagnosis not present

## 2022-07-11 DIAGNOSIS — M6281 Muscle weakness (generalized): Secondary | ICD-10-CM | POA: Diagnosis not present

## 2022-07-11 LAB — BASIC METABOLIC PANEL
BUN/Creatinine Ratio: 12 (ref 12–28)
BUN: 11 mg/dL (ref 8–27)
CO2: 25 mmol/L (ref 20–29)
Calcium: 9.9 mg/dL (ref 8.7–10.3)
Chloride: 99 mmol/L (ref 96–106)
Creatinine, Ser: 0.92 mg/dL (ref 0.57–1.00)
Glucose: 118 mg/dL — ABNORMAL HIGH (ref 70–99)
Potassium: 4.9 mmol/L (ref 3.5–5.2)
Sodium: 138 mmol/L (ref 134–144)
eGFR: 60 mL/min/{1.73_m2} (ref >59)

## 2022-07-11 LAB — HEMOGLOBIN A1C
Est. average glucose Bld gHb Est-mCnc: 160 mg/dL
Hgb A1c MFr Bld: 7.2 % — ABNORMAL HIGH (ref 4.8–5.6)

## 2022-07-12 DIAGNOSIS — M6281 Muscle weakness (generalized): Secondary | ICD-10-CM | POA: Diagnosis not present

## 2022-07-15 DIAGNOSIS — R278 Other lack of coordination: Secondary | ICD-10-CM | POA: Diagnosis not present

## 2022-07-16 DIAGNOSIS — M6281 Muscle weakness (generalized): Secondary | ICD-10-CM | POA: Diagnosis not present

## 2022-07-17 ENCOUNTER — Ambulatory Visit (INDEPENDENT_AMBULATORY_CARE_PROVIDER_SITE_OTHER): Payer: Medicare Other | Admitting: Family Medicine

## 2022-07-17 VITALS — BP 140/80 | HR 72 | Temp 98.2°F | Ht 68.0 in | Wt 173.0 lb

## 2022-07-17 DIAGNOSIS — I10 Essential (primary) hypertension: Secondary | ICD-10-CM | POA: Diagnosis not present

## 2022-07-17 DIAGNOSIS — M791 Myalgia, unspecified site: Secondary | ICD-10-CM | POA: Diagnosis not present

## 2022-07-17 DIAGNOSIS — E119 Type 2 diabetes mellitus without complications: Secondary | ICD-10-CM | POA: Diagnosis not present

## 2022-07-17 DIAGNOSIS — K219 Gastro-esophageal reflux disease without esophagitis: Secondary | ICD-10-CM

## 2022-07-17 DIAGNOSIS — E785 Hyperlipidemia, unspecified: Secondary | ICD-10-CM | POA: Diagnosis not present

## 2022-07-17 DIAGNOSIS — T466X5A Adverse effect of antihyperlipidemic and antiarteriosclerotic drugs, initial encounter: Secondary | ICD-10-CM | POA: Diagnosis not present

## 2022-07-17 DIAGNOSIS — R278 Other lack of coordination: Secondary | ICD-10-CM | POA: Diagnosis not present

## 2022-07-17 DIAGNOSIS — E1169 Type 2 diabetes mellitus with other specified complication: Secondary | ICD-10-CM

## 2022-07-17 NOTE — Progress Notes (Signed)
   Subjective:    Patient ID: [name], female    DOB: [birth year], 89 y.o.   MRN: [id number]  HPI Patient arrives today for follow with diabetes.  Patient states she has no concerns or issues today. Hyperlipidemia associated with type 2 diabetes mellitus ([place])  Controlled type 2 diabetes mellitus without complication, without long-term current use of insulin ([facility])  Essential hypertension  Gastroesophageal reflux disease without esophagitis  Myalgia due to statin Labs were reviewed in detail She does not tolerate statins because of myalgias Her reflux under decent control with medication Blood pressure has been under fair control for her age Her A1c has gone up but she states she has not been doing the walking that she was doing before her knee replacement She will be working hard to get her walking back in order to help bring her blood pressure down  Review of Systems     Objective:   Physical Exam General-in no acute distress Eyes-no discharge Lungs-respiratory rate normal, CTA CV-no murmurs,RRR Extremities skin warm dry no edema Neuro grossly normal Behavior normal, alert        Assessment & Plan:  Blood pressure acceptable for her age Diabetes fairly good control continue medication Recent knee surgery has recovered doing very well 1. Hyperlipidemia associated with type 2 diabetes mellitus ([place]) Not on statin due to myalgia with statins healthy diet recommended I do not feel the injectables are indicated at this current moment 2. Controlled type 2 diabetes mellitus without complication, without long-term current use of insulin ([facility]) A1c overall looks good continue metformin I would not recommend increasing medication at this time the best approach is healthier diet along with increased walking and we will relook at A1c before the next visit She will do her best 3. Essential hypertension Blood pressure reasonable for age continue current measures Blood  pressure is acceptable within age parameters 4. Gastroesophageal reflux disease without esophagitis Reflux good control  Labs reviewed  Follow-up 4 months

## 2022-07-18 ENCOUNTER — Other Ambulatory Visit: Payer: Self-pay

## 2022-07-18 DIAGNOSIS — E119 Type 2 diabetes mellitus without complications: Secondary | ICD-10-CM

## 2022-07-18 DIAGNOSIS — E1169 Type 2 diabetes mellitus with other specified complication: Secondary | ICD-10-CM

## 2022-07-18 DIAGNOSIS — M6281 Muscle weakness (generalized): Secondary | ICD-10-CM | POA: Diagnosis not present

## 2022-07-19 DIAGNOSIS — M6281 Muscle weakness (generalized): Secondary | ICD-10-CM | POA: Diagnosis not present

## 2022-07-22 DIAGNOSIS — M6281 Muscle weakness (generalized): Secondary | ICD-10-CM | POA: Diagnosis not present

## 2022-07-23 DIAGNOSIS — M6281 Muscle weakness (generalized): Secondary | ICD-10-CM | POA: Diagnosis not present

## 2022-07-24 DIAGNOSIS — R278 Other lack of coordination: Secondary | ICD-10-CM | POA: Diagnosis not present

## 2022-07-25 DIAGNOSIS — R278 Other lack of coordination: Secondary | ICD-10-CM | POA: Diagnosis not present

## 2022-07-25 DIAGNOSIS — M6281 Muscle weakness (generalized): Secondary | ICD-10-CM | POA: Diagnosis not present

## 2022-07-26 DIAGNOSIS — R278 Other lack of coordination: Secondary | ICD-10-CM | POA: Diagnosis not present

## 2022-07-29 DIAGNOSIS — M6281 Muscle weakness (generalized): Secondary | ICD-10-CM | POA: Diagnosis not present

## 2022-07-30 ENCOUNTER — Other Ambulatory Visit: Payer: Self-pay | Admitting: Family Medicine

## 2022-07-30 DIAGNOSIS — M6281 Muscle weakness (generalized): Secondary | ICD-10-CM | POA: Diagnosis not present

## 2022-07-31 DIAGNOSIS — R278 Other lack of coordination: Secondary | ICD-10-CM | POA: Diagnosis not present

## 2022-08-01 ENCOUNTER — Other Ambulatory Visit: Payer: Self-pay | Admitting: *Deleted

## 2022-08-01 DIAGNOSIS — M6281 Muscle weakness (generalized): Secondary | ICD-10-CM | POA: Diagnosis not present

## 2022-08-02 DIAGNOSIS — R278 Other lack of coordination: Secondary | ICD-10-CM | POA: Diagnosis not present

## 2022-08-05 DIAGNOSIS — M6281 Muscle weakness (generalized): Secondary | ICD-10-CM | POA: Diagnosis not present

## 2022-08-06 DIAGNOSIS — M6281 Muscle weakness (generalized): Secondary | ICD-10-CM | POA: Diagnosis not present

## 2022-08-07 DIAGNOSIS — R278 Other lack of coordination: Secondary | ICD-10-CM | POA: Diagnosis not present

## 2022-08-08 DIAGNOSIS — M6281 Muscle weakness (generalized): Secondary | ICD-10-CM | POA: Diagnosis not present

## 2022-08-09 DIAGNOSIS — R278 Other lack of coordination: Secondary | ICD-10-CM | POA: Diagnosis not present

## 2022-08-12 DIAGNOSIS — M6281 Muscle weakness (generalized): Secondary | ICD-10-CM | POA: Diagnosis not present

## 2022-08-13 DIAGNOSIS — M6281 Muscle weakness (generalized): Secondary | ICD-10-CM | POA: Diagnosis not present

## 2022-08-14 DIAGNOSIS — R278 Other lack of coordination: Secondary | ICD-10-CM | POA: Diagnosis not present

## 2022-08-15 DIAGNOSIS — M6281 Muscle weakness (generalized): Secondary | ICD-10-CM | POA: Diagnosis not present

## 2022-08-16 DIAGNOSIS — R278 Other lack of coordination: Secondary | ICD-10-CM | POA: Diagnosis not present

## 2022-08-19 ENCOUNTER — Other Ambulatory Visit: Payer: Self-pay | Admitting: Family Medicine

## 2022-08-19 DIAGNOSIS — M6281 Muscle weakness (generalized): Secondary | ICD-10-CM | POA: Diagnosis not present

## 2022-08-20 DIAGNOSIS — M6281 Muscle weakness (generalized): Secondary | ICD-10-CM | POA: Diagnosis not present

## 2022-08-21 DIAGNOSIS — R278 Other lack of coordination: Secondary | ICD-10-CM | POA: Diagnosis not present

## 2022-08-23 ENCOUNTER — Telehealth: Payer: Self-pay

## 2022-08-23 DIAGNOSIS — R278 Other lack of coordination: Secondary | ICD-10-CM | POA: Diagnosis not present

## 2022-08-23 NOTE — Telephone Encounter (Signed)
Please advise. Thank you

## 2022-08-23 NOTE — Telephone Encounter (Signed)
Patient dropped off document FL2, to be filled out by provider. Patient requested to send it via Call Patient to pick up within 7-days. Document is located in providers tray at front office.

## 2022-08-25 NOTE — Telephone Encounter (Signed)
These forms were signed Whoever helped fill out these forms I greatly appreciate it

## 2022-08-26 DIAGNOSIS — M6281 Muscle weakness (generalized): Secondary | ICD-10-CM | POA: Diagnosis not present

## 2022-08-27 DIAGNOSIS — M6281 Muscle weakness (generalized): Secondary | ICD-10-CM | POA: Diagnosis not present

## 2022-08-28 DIAGNOSIS — R278 Other lack of coordination: Secondary | ICD-10-CM | POA: Diagnosis not present

## 2022-08-29 DIAGNOSIS — E1142 Type 2 diabetes mellitus with diabetic polyneuropathy: Secondary | ICD-10-CM | POA: Diagnosis not present

## 2022-08-29 DIAGNOSIS — L84 Corns and callosities: Secondary | ICD-10-CM | POA: Diagnosis not present

## 2022-08-29 DIAGNOSIS — B351 Tinea unguium: Secondary | ICD-10-CM | POA: Diagnosis not present

## 2022-08-30 DIAGNOSIS — R278 Other lack of coordination: Secondary | ICD-10-CM | POA: Diagnosis not present

## 2022-09-02 DIAGNOSIS — M6281 Muscle weakness (generalized): Secondary | ICD-10-CM | POA: Diagnosis not present

## 2022-09-03 DIAGNOSIS — M6281 Muscle weakness (generalized): Secondary | ICD-10-CM | POA: Diagnosis not present

## 2022-09-04 DIAGNOSIS — R278 Other lack of coordination: Secondary | ICD-10-CM | POA: Diagnosis not present

## 2022-09-06 ENCOUNTER — Ambulatory Visit (INDEPENDENT_AMBULATORY_CARE_PROVIDER_SITE_OTHER): Payer: Medicare Other

## 2022-09-06 VITALS — Ht 68.0 in | Wt 173.0 lb

## 2022-09-06 DIAGNOSIS — R278 Other lack of coordination: Secondary | ICD-10-CM | POA: Diagnosis not present

## 2022-09-06 DIAGNOSIS — Z0000 Encounter for general adult medical examination without abnormal findings: Secondary | ICD-10-CM

## 2022-09-06 NOTE — Progress Notes (Signed)
Subjective:   [name] is a 89 y.o. female who presents for Medicare Annual (Subsequent) preventive examination.  I connected with  [name] on 09/06/22 by a audio enabled telemedicine application and verified that I am speaking with the correct person using two identifiers.  Patient Location: Other:  [place]   Provider Location: Office/Clinic  I discussed the limitations of evaluation and management by telemedicine. The patient expressed understanding and agreed to proceed.  Review of Systems     Cardiac Risk Factors include: advanced age (>92mn, >>105women);diabetes mellitus;dyslipidemia;hypertension;sedentary lifestyle     Objective:    Today's Vitals   09/06/22 1024  Weight: 173 lb (78.5 kg)  Height: '5\' 8"'$  (1.727 m)   Body mass index is 26.3 kg/m.     09/06/2022   10:33 AM 05/08/2022   10:46 AM 05/03/2022    9:59 AM 04/10/2022    1:38 PM 03/29/2022   10:28 AM 02/17/2022   10:00 PM 08/28/2021    9:52 AM  Advanced Directives  Does Patient Have a Medical Advance Directive? Yes Yes Yes Yes Yes No Yes  Type of [place] Out of facility DNR (pink MOST or yellow form);Living will Living will;Out of facility DNR (pink MOST or yellow form) [place]   [place]  Does patient want to make changes to medical advance directive? No - Patient declined No - Patient declined No - Patient declined No - Patient declined     Copy of [place] Chart? Yes - validated most recent copy scanned in chart (See row information)   No - copy requested   Yes - validated most recent copy scanned in chart (See row information)  Would patient like information on creating a medical advance directive?      No - Patient declined   Pre-existing out of facility DNR order (yellow form or pink MOST form)   Yellow form placed in chart (order not valid  for inpatient use)        Current Medications (verified) Outpatient Encounter Medications as of 09/06/2022  Medication Sig   acetaminophen (TYLENOL) 325 MG tablet TAKE 2 TABLETS('650MG'$ ) BY MOUTH EVERY 6 HOURS AS NEEDED FOR PAIN/TEMPERATURE.   ALLERGY RELIEF 10 MG tablet TAKE (1) TABLET BY MOUTH ONCE DAILY.   ALPRAZolam (XANAX) 0.25 MG tablet 1 q am for 2 weeks   amLODipine (NORVASC) 10 MG tablet Take 1 tablet (10 mg total) by mouth daily.   beta carotene (BETA CAROTENE PROVITAMIN A) 25000 UNIT capsule TAKE (1) CAPSULE BY MOUTH AT BEDTIME.   beta carotene w/minerals (OCUVITE) tablet Take 1 tablet by mouth at bedtime.   bisacodyl (DULCOLAX) 5 MG EC tablet Take 1 tablet (5 mg total) by mouth daily as needed for moderate constipation.   Carboxymethylcellulose Sodium (THERATEARS) 0.25 % SOLN Apply 1 drop to eye in the morning and at bedtime.   cyanocobalamin 1000 MCG tablet Take 1,000 mcg by mouth at bedtime.   dicyclomine (BENTYL) 10 MG capsule 1 bid prn abd cramps and loose stool   EASYMAX TEST test strip CHECK BLOOD SUGAR ONCE DAILY.(CALL MD IF BS BELOW 60: OR IF BS ABOVE 400)   famotidine (PEPCID) 20 MG tablet TAKE 1 TABLET BY MOUTH EACH MORNING.   gabapentin (NEURONTIN) 100 MG capsule Take 3 capsules (300 mg total) by mouth 2 (two) times daily.   GOODSENSE HEMORRHOIDAL 0.25-14-74.9 % rectal ointment Place  1 Application rectally 3 (three) times daily as needed for irritation.   Ipratropium-Albuterol (COMBIVENT RESPIMAT) 20-100 MCG/ACT AERS respimat Inhale 1 puff into the lungs every 6 (six) hours as needed for wheezing or shortness of breath.   losartan (COZAAR) 100 MG tablet Take 1 tablet daily   Melatonin 3 MG SUBL TAKE (1) TABLET BY MOUTH AT BEDTIME.   metFORMIN (GLUCOPHAGE) 500 MG tablet Take one tablet '500mg'$  po in the morning and 1/2 tablet 250 mg po at supper   Multiple Vitamins-Minerals (PRESERVISION AREDS 2) CAPS Take 1 capsule by mouth in the morning and at bedtime.   Omega-3 Fatty  Acids (FISH OIL) 1000 MG CAPS One capsule BID   pantoprazole (PROTONIX) 20 MG tablet TAKE (1) TABLET BY MOUTH ONCE DAILY.   Vitamin D, Cholecalciferol, 10 MCG (400 UNIT) TABS TAKE 1 TABLET BY MOUTH ONCE DAILY.   [DISCONTINUED] benzonatate (TESSALON) 100 MG capsule Take 1 capsule (100 mg total) by mouth 3 (three) times daily as needed for cough.   [DISCONTINUED] melatonin 3 MG TABS tablet Take 3 mg by mouth at bedtime.   No facility-administered encounter medications on file as of 09/06/2022.    Allergies (verified) Hydrocortisone, Lisinopril, Phenergan [promethazine hcl], Statins, Trazodone and nefazodone, and Prednisone   History: Past Medical History:  Diagnosis Date   Cognitive dysfunction 03/04/2019   Patient scores 21 out of 30 on [place] cognitive assessment September 2020   Diabetes mellitus without complication ([place])    Diabetic peripheral neuropathy associated with type 2 diabetes mellitus ([place]) 02/25/2022   Diverticulitis    Frailty 03/04/2019   H/O bilateral breast reduction surgery    Hyperlipidemia    a. intolerant to statins.    Hypertension    Past Surgical History:  Procedure Laterality Date   ABDOMINAL HYSTERECTOMY  2003   APPENDECTOMY     age 89   BREAST REDUCTION SURGERY     age 86   COLON SURGERY Left 2011   Hemicolectomy due to diverticulitis   EYE SURGERY  03/30/2009   cataract   KNEE SURGERY Right 2003   knee cap   LAPAROSCOPIC INCISIONAL / UMBILICAL / VENTRAL HERNIA REPAIR  02/23/2007   REDUCTION MAMMAPLASTY Bilateral 2001   REFRACTIVE SURGERY     TOTAL KNEE ARTHROPLASTY Right 04/10/2022   Procedure: TOTAL KNEE ARTHROPLASTY;  Surgeon: Sydnee Cabal, MD;  Location: [facility];  Service: Orthopedics;  Laterality: Right;  adductor canal   Family History  Problem Relation Age of Onset   Cancer Mother        ovaian   Hypertension Father        kidney   Stroke Father    Cancer Father    Hypertension Sister    Diabetes Brother    Social History    Socioeconomic History   Marital status: Widowed    Spouse name: Not on file   Number of children: Not on file   Years of education: Not on file   Highest education level: Not on file  Occupational History   Not on file  Tobacco Use   Smoking status: Never   Smokeless tobacco: Never  Vaping Use   Vaping Use: Never used  Substance and Sexual Activity   Alcohol use: No   Drug use: No   Sexual activity: Never    Birth control/protection: Abstinence  Other Topics Concern   Not on file  Social History Narrative   Not on file   Social Determinants of Health   Financial Resource  Strain: Low Risk  (09/06/2022)   Overall Financial Resource Strain (CARDIA)    Difficulty of Paying Living Expenses: Not hard at all  Food Insecurity: No Food Insecurity (09/06/2022)   Hunger Vital Sign    Worried About Running Out of Food in the Last Year: Never true    Ran Out of Food in the Last Year: Never true  Transportation Needs: No Transportation Needs (09/06/2022)   PRAPARE - Hydrologist (Medical): No    Lack of Transportation (Non-Medical): No  Physical Activity: Sufficiently Active (09/06/2022)   Exercise Vital Sign    Days of Exercise per Week: 5 days    Minutes of Exercise per Session: 30 min  Stress: No Stress Concern Present (09/06/2022)   [place]    Feeling of Stress : Not at all  Social Connections: Moderately Isolated (09/06/2022)   Social Connection and Isolation Panel [NHANES]    Frequency of Communication with Friends and Family: More than three times a week    Frequency of Social Gatherings with Friends and Family: More than three times a week    Attends Religious Services: 1 to 4 times per year    Active Member of [employer] or Organizations: No    Attends Archivist Meetings: Never    Marital Status: Widowed    Tobacco Counseling Counseling given: Not Answered   Clinical  Intake:  Pre-visit preparation completed: Yes  Pain : No/denies pain  Diabetes: Yes CBG done?: No Did pt. bring in CBG monitor from home?: No  How often do you need to have someone help you when you read instructions, pamphlets, or other written materials from your doctor or pharmacy?: 1 - Never  Diabetic?Yes   Nutrition Risk Assessment:  Has the patient had any N/V/D within the last 2 months?  No  Does the patient have any non-healing wounds?  No  Has the patient had any unintentional weight loss or weight gain?  No   Diabetes:  Is the patient diabetic?  Yes  If diabetic, was a CBG obtained today?  No  Did the patient bring in their glucometer from home?  No  How often do you monitor your CBG's? Daily.   Financial Strains and Diabetes Management:  Are you having any financial strains with the device, your supplies or your medication? No .  Does the patient want to be seen by Chronic Care Management for management of their diabetes?  No  Would the patient like to be referred to a Nutritionist or for Diabetic Management?  No   Diabetic Exams:  Diabetic Eye Exam: Completed records requested  Diabetic Foot Exam: Completed 12/13/21   Interpreter Needed?: No  Comments: Resides at assisted living facility Information entered by :: Denman George LPN   Activities of Daily Living    09/06/2022   10:33 AM 04/10/2022    1:50 PM  In your present state of health, do you have any difficulty performing the following activities:  Hearing? 0   Vision? 0   Difficulty concentrating or making decisions? 0   Walking or climbing stairs? 1   Dressing or bathing? 0   Doing errands, shopping? 0 0  Preparing Food and eating ? N   Using the Toilet? N   In the past six months, have you accidently leaked urine? N   Do you have problems with loss of bowel control? N   Managing your Medications? N  Managing your Finances? N   Housekeeping or managing your Housekeeping? Y     Patient  Care Team: Kathyrn Drown, MD as PCP - General (Family Medicine) Josue Hector, MD as PCP - Cardiology (Cardiology) Leticia Clas, [place] (Optometry) Sydnee Cabal, MD as Consulting Physician (Orthopedic Surgery) Caprice Beaver, [place] as Consulting Physician (Podiatry)  Indicate any recent [place] you may have received from other than [facility] providers in the past year (date may be approximate).     Assessment:   This is a routine wellness examination for Ravae.  Hearing/Vision screen Hearing Screening - Comments:: Denies hearing difficulties  Vision Screening - Comments:: Wears rx glasses - up to date with routine eye exams with Dr. Leticia Clas    Dietary issues and exercise activities discussed: Current Exercise Habits: Structured exercise class, Type of exercise: strength training/weights;stretching;walking, Time (Minutes): 30, Frequency (Times/Week): 5, Weekly Exercise (Minutes/Week): 150, Intensity: Mild   Goals Addressed             This Visit's Progress    COMPLETED: Exercise 3x per week (30 min per time)       Continue to exercise and stay healthy.     COMPLETED: Medication Management       Patient Goals/Self-Care Activities Over the next 90  days, patient will:  Take medications as prescribed Check blood sugar once a day at the following times: fasting (at least 8 hours since last food consumption), document, and provide at future appointments Check blood pressure a few times per week, document, and provide at future appointments          Prevent falls       Remain active         Depression Screen    09/06/2022   10:40 AM 07/17/2022   10:50 AM 08/28/2021    9:47 AM 08/07/2021    1:22 PM 04/03/2021    1:16 PM 10/16/2020   11:18 AM 02/15/2020    9:57 AM  PHQ 2/9 Scores  PHQ - 2 Score 0 0 0 0 0 0 0  PHQ- 9 Score 0 0         Fall Risk    09/06/2022   10:30 AM 07/17/2022   10:50 AM 08/28/2021    9:53 AM 08/07/2021    1:22 PM 04/03/2021    1:16  PM  Fall Risk   Falls in the past year? 0 0 0 0 0  Number falls in past yr: 0 0 0  0  Injury with Fall? 0 0 0  0  Risk for fall due to : Impaired balance/gait;Impaired mobility  No Fall Risks  No Fall Risks  Follow up Falls prevention discussed;Education provided;Falls evaluation completed  Falls prevention discussed Falls evaluation completed Falls evaluation completed    FALL RISK PREVENTION PERTAINING TO THE HOME:  Any stairs in or around the home? No  If so, are there any without handrails? No  Home free of loose throw rugs in walkways, pet beds, electrical cords, etc? Yes  Adequate lighting in your home to reduce risk of falls? Yes   ASSISTIVE DEVICES UTILIZED TO PREVENT FALLS:  Life alert? No  Use of a cane, walker or w/c? Yes  Grab bars in the bathroom? Yes  Shower chair or bench in shower? Yes  Elevated toilet seat or a handicapped toilet? Yes   TIMED UP AND GO:  Was the test performed? No . Telephonic visit   Cognitive Function:  09/06/2022   10:33 AM 08/28/2021    9:56 AM  6CIT Screen  What Year? 0 points 0 points  What month? 0 points 0 points  What time? 0 points 0 points  Count back from 20 0 points 0 points  Months in reverse 2 points 4 points  Repeat phrase 0 points 0 points  Total Score 2 points 4 points    Immunizations Immunization History  Administered Date(s) Administered   Fluad Quad(high Dose 65+) 04/04/2020, 04/03/2021, 03/12/2022   Influenza, High Dose Seasonal PF 03/09/2018   Influenza,inj,Quad PF,6+ Mos 03/28/2014, 03/23/2019   Influenza-Unspecified 03/12/2013, 04/16/2016, 04/03/2017, 04/04/2017   Moderna Covid-19 Vaccine Bivalent Booster 26yr & up 04/24/2022   Moderna Sars-Covid-2 Vaccination 07/07/2019, 07/28/2019, 08/11/2020   PPD Test 02/09/2019   Pneumococcal Conjugate-13 03/28/2014   Pneumococcal Polysaccharide-23 07/01/2006, 03/23/2019   Pneumococcal-Unspecified 03/05/2022   RSV,unspecified 07/23/2022   Tdap 05/01/2011,  01/05/2017   Zoster Recombinat (Shingrix) 04/17/2017, 06/17/2017   Zoster, Live 10/29/2007    TDAP status: Up to date  Flu Vaccine status: Up to date  Pneumococcal vaccine status: Up to date  Covid-19 vaccine status: Information provided on how to obtain vaccines.   Qualifies for Shingles Vaccine? Yes   Zostavax completed Yes   Shingrix Completed?: Yes  Screening Tests Health Maintenance  Topic Date Due   OPHTHALMOLOGY EXAM  05/21/2022   COVID-19 Vaccine (5 - 2023-24 season) 06/19/2022   FOOT EXAM  12/14/2022   HEMOGLOBIN A1C  01/08/2023   Medicare Annual Wellness (AWV)  09/06/2023   DTaP/Tdap/Td (3 - Td or Tdap) 01/06/2027   Pneumonia Vaccine 89 Years old  Completed   INFLUENZA VACCINE  Completed   DEXA SCAN  Completed   Zoster Vaccines- Shingrix  Completed   HPV VACCINES  Aged Out    Health Maintenance  Health Maintenance Due  Topic Date Due   OPHTHALMOLOGY EXAM  05/21/2022   COVID-19 Vaccine (5 - 2023-24 season) 06/19/2022    Colorectal cancer screening: No longer required.   Mammogram status: No longer required due to age.  Bone Density status: Completed 10/07/16. Results reflect: Bone density results: OSTEOPENIA. Repeat every 2 years.  Lung Cancer Screening: (Low Dose CT Chest recommended if Age 87-80years, 30 pack-year currently smoking OR have quit w/in 15years.) does not qualify.   Lung Cancer Screening Referral: n/a  Additional Screening:  Hepatitis C Screening: does not qualify;  Vision Screening: Recommended annual ophthalmology exams for early detection of glaucoma and other disorders of the eye. Is the patient up to date with their annual eye exam?  Yes  Who is the provider or what is the name of the office in which the patient attends annual eye exams? Dr. RLeticia Clas If pt is not established with a provider, would they like to be referred to a provider to establish care? No .   Dental Screening: Recommended annual dental exams for proper  oral hygiene  Community Resource Referral / Chronic Care Management: CRR required this visit?  No   CCM required this visit?  No      Plan:     I have personally reviewed and noted the following in the patient's chart:   Medical and social history Use of alcohol, tobacco or illicit drugs  Current medications and supplements including opioid prescriptions. Patient is not currently taking opioid prescriptions. Functional ability and status Nutritional status Physical activity Advanced directives List of other physicians Hospitalizations, surgeries, and ER visits in previous 12 months Vitals Screenings to  include cognitive, depression, and falls Referrals and appointments  In addition, I have reviewed and discussed with patient certain preventive protocols, quality metrics, and best practice recommendations. A written personalized care plan for preventive services as well as general preventive health recommendations were provided to patient.     Vanetta Mulders, [place]   [id number]   Due to this being a virtual visit, the after visit summary with patients personalized plan was offered to patient via mail or my-chart. per request, patient was mailed a copy of AVS.  Nurse Notes: No concerns

## 2022-09-06 NOTE — Patient Instructions (Addendum)
[name] , Thank you for taking time to come for your Medicare Wellness Visit. I appreciate your ongoing commitment to your health goals. Please review the following plan we discussed and let me know if I can assist you in the future.   These are the goals we discussed:  Goals      Prevent falls     Remain active        This is a list of the screening recommended for you and due dates:  Health Maintenance  Topic Date Due   Eye exam for diabetics  05/21/2022   COVID-19 Vaccine (5 - 2023-24 season) 06/19/2022   Complete foot exam   12/14/2022   Hemoglobin A1C  01/08/2023   Medicare Annual Wellness Visit  09/06/2023   DTaP/Tdap/Td vaccine (3 - Td or Tdap) 01/06/2027   Pneumonia Vaccine  Completed   Flu Shot  Completed   DEXA scan (bone density measurement)  Completed   Zoster (Shingles) Vaccine  Completed   HPV Vaccine  Aged Out    Advanced directives: We have a copy of your advanced directives available in your record should your provider ever need to access them.   Conditions/risks identified: Aim for 30 minutes of exercise or brisk walking, 6-8 glasses of water, and 5 servings of fruits and vegetables each day.  Next appointment: Follow up in one year for your annual wellness visit    Preventive Care 65 Years and Older, Female Preventive care refers to lifestyle choices and visits with your health care provider that can promote health and wellness. What does preventive care include? A yearly physical exam. This is also called an annual well check. Dental exams once or twice a year. Routine eye exams. Ask your health care provider how often you should have your eyes checked. Personal lifestyle choices, including: Daily care of your teeth and gums. Regular physical activity. Eating a healthy diet. Avoiding tobacco and drug use. Limiting alcohol use. Practicing safe sex. Taking low-dose aspirin every day. Taking vitamin and mineral supplements as recommended by your  health care provider. What happens during an annual well check? The services and screenings done by your health care provider during your annual well check will depend on your age, overall health, lifestyle risk factors, and family history of disease. Counseling  Your health care provider may ask you questions about your: Alcohol use. Tobacco use. Drug use. Emotional well-being. Home and relationship well-being. Sexual activity. Eating habits. History of falls. Memory and ability to understand (cognition). Work and work Statistician. Reproductive health. Screening  You may have the following tests or measurements: Height, weight, and BMI. Blood pressure. Lipid and cholesterol levels. These may be checked every 5 years, or more frequently if you are over 86 years old. Skin check. Lung cancer screening. You may have this screening every year starting at age 80 if you have a 30-pack-year history of smoking and currently smoke or have quit within the past 15 years. Fecal occult blood test (FOBT) of the stool. You may have this test every year starting at age 43. Flexible sigmoidoscopy or colonoscopy. You may have a sigmoidoscopy every 5 years or a colonoscopy every 10 years starting at age 70. Hepatitis C blood test. Hepatitis B blood test. Sexually transmitted disease (STD) testing. Diabetes screening. This is done by checking your blood sugar (glucose) after you have not eaten for a while (fasting). You may have this done every 1-3 years. Bone density scan. This is done to screen for  osteoporosis. You may have this done starting at age 31. Mammogram. This may be done every 1-2 years. Talk to your health care provider about how often you should have regular mammograms. Talk with your health care provider about your test results, treatment options, and if necessary, the need for more tests. Vaccines  Your health care provider may recommend certain vaccines, such as: Influenza vaccine.  This is recommended every year. Tetanus, diphtheria, and acellular pertussis (Tdap, Td) vaccine. You may need a Td booster every 10 years. Zoster vaccine. You may need this after age 82. Pneumococcal 13-valent conjugate (PCV13) vaccine. One dose is recommended after age 69. Pneumococcal polysaccharide (PPSV23) vaccine. One dose is recommended after age 72. Talk to your health care provider about which screenings and vaccines you need and how often you need them. This information is not intended to replace advice given to you by your health care provider. Make sure you discuss any questions you have with your health care provider. Document Released: 07/14/2015 Document Revised: 03/06/2016 Document Reviewed: 04/18/2015 Elsevier Interactive Patient Education  2017 [place] Prevention in the Home Falls can cause injuries. They can happen to people of all ages. There are many things you can do to make your home safe and to help prevent falls. What can I do on the outside of my home? Regularly fix the edges of walkways and driveways and fix any cracks. Remove anything that might make you trip as you walk through a door, such as a raised step or threshold. Trim any bushes or trees on the path to your home. Use bright outdoor lighting. Clear any walking paths of anything that might make someone trip, such as rocks or tools. Regularly check to see if handrails are loose or broken. Make sure that both sides of any steps have handrails. Any raised decks and porches should have guardrails on the edges. Have any leaves, snow, or ice cleared regularly. Use sand or salt on walking paths during winter. Clean up any spills in your garage right away. This includes oil or grease spills. What can I do in the bathroom? Use night lights. Install grab bars by the toilet and in the tub and shower. Do not use towel bars as grab bars. Use non-skid mats or decals in the tub or shower. If you need to sit  down in the shower, use a plastic, non-slip stool. Keep the floor dry. Clean up any water that spills on the floor as soon as it happens. Remove soap buildup in the tub or shower regularly. Attach bath mats securely with double-sided non-slip rug tape. Do not have throw rugs and other things on the floor that can make you trip. What can I do in the bedroom? Use night lights. Make sure that you have a light by your bed that is easy to reach. Do not use any sheets or blankets that are too big for your bed. They should not hang down onto the floor. Have a firm chair that has side arms. You can use this for support while you get dressed. Do not have throw rugs and other things on the floor that can make you trip. What can I do in the kitchen? Clean up any spills right away. Avoid walking on wet floors. Keep items that you use a lot in easy-to-reach places. If you need to reach something above you, use a strong step stool that has a grab bar. Keep electrical cords out of the way. Do not  use floor polish or wax that makes floors slippery. If you must use wax, use non-skid floor wax. Do not have throw rugs and other things on the floor that can make you trip. What can I do with my stairs? Do not leave any items on the stairs. Make sure that there are handrails on both sides of the stairs and use them. Fix handrails that are broken or loose. Make sure that handrails are as long as the stairways. Check any carpeting to make sure that it is firmly attached to the stairs. Fix any carpet that is loose or worn. Avoid having throw rugs at the top or bottom of the stairs. If you do have throw rugs, attach them to the floor with carpet tape. Make sure that you have a light switch at the top of the stairs and the bottom of the stairs. If you do not have them, ask someone to add them for you. What else can I do to help prevent falls? Wear shoes that: Do not have high heels. Have rubber bottoms. Are  comfortable and fit you well. Are closed at the toe. Do not wear sandals. If you use a stepladder: Make sure that it is fully opened. Do not climb a closed stepladder. Make sure that both sides of the stepladder are locked into place. Ask someone to hold it for you, if possible. Clearly mark and make sure that you can see: Any grab bars or handrails. First and last steps. Where the edge of each step is. Use tools that help you move around (mobility aids) if they are needed. These include: Canes. Walkers. Scooters. Crutches. Turn on the lights when you go into a dark area. Replace any light bulbs as soon as they burn out. Set up your furniture so you have a clear path. Avoid moving your furniture around. If any of your floors are uneven, fix them. If there are any pets around you, be aware of where they are. Review your medicines with your doctor. Some medicines can make you feel dizzy. This can increase your chance of falling. Ask your doctor what other things that you can do to help prevent falls. This information is not intended to replace advice given to you by your health care provider. Make sure you discuss any questions you have with your health care provider. Document Released: 04/13/2009 Document Revised: 11/23/2015 Document Reviewed: 07/22/2014 Elsevier Interactive Patient Education  2017 [employer].

## 2022-09-09 DIAGNOSIS — M6281 Muscle weakness (generalized): Secondary | ICD-10-CM | POA: Diagnosis not present

## 2022-09-10 DIAGNOSIS — M6281 Muscle weakness (generalized): Secondary | ICD-10-CM | POA: Diagnosis not present

## 2022-09-11 ENCOUNTER — Telehealth: Payer: Self-pay

## 2022-09-11 NOTE — Telephone Encounter (Signed)
[name] from [place] called said [name] was complaining about a sore on her bottom when it was checked if more like a pressure boil. [name] said we could give orders for a nurse to check her since we do not have any appt to put her in.  [name] call back is [phone]

## 2022-09-11 NOTE — Telephone Encounter (Signed)
Silvia from [place] called said Kendrix was complaining about a sore on her bottom when it was checked if more like a pressure boil. Eyvonne Mechanic said we could give orders for a nurse to check her since we do not have any appt to put her in.   Eyvonne Mechanic call back is [phone]  Spoke with nurse and Elmyra Ricks is coming in tonight to evaluate patient's bottom from [facility]. Consulted with Autumn RN [facility]

## 2022-09-11 NOTE — Telephone Encounter (Signed)
If what they do does not adequately take care of the problem we definitely would recommend a follow-up visit next week with myself thank you

## 2022-09-12 NOTE — Telephone Encounter (Signed)
Nurse sent over description of wound and plan for wound care. Form signed by Dr Lacinda Axon and faxed back and patient scheduled follow up with Dr Nicki Reaper later in the month.

## 2022-09-13 DIAGNOSIS — E785 Hyperlipidemia, unspecified: Secondary | ICD-10-CM | POA: Diagnosis not present

## 2022-09-13 DIAGNOSIS — E1169 Type 2 diabetes mellitus with other specified complication: Secondary | ICD-10-CM | POA: Diagnosis not present

## 2022-09-13 DIAGNOSIS — F411 Generalized anxiety disorder: Secondary | ICD-10-CM | POA: Diagnosis not present

## 2022-09-13 DIAGNOSIS — E43 Unspecified severe protein-calorie malnutrition: Secondary | ICD-10-CM | POA: Diagnosis not present

## 2022-09-13 DIAGNOSIS — Z7984 Long term (current) use of oral hypoglycemic drugs: Secondary | ICD-10-CM | POA: Diagnosis not present

## 2022-09-13 DIAGNOSIS — Z96651 Presence of right artificial knee joint: Secondary | ICD-10-CM | POA: Diagnosis not present

## 2022-09-13 DIAGNOSIS — E1159 Type 2 diabetes mellitus with other circulatory complications: Secondary | ICD-10-CM | POA: Diagnosis not present

## 2022-09-13 DIAGNOSIS — E1142 Type 2 diabetes mellitus with diabetic polyneuropathy: Secondary | ICD-10-CM | POA: Diagnosis not present

## 2022-09-13 DIAGNOSIS — I152 Hypertension secondary to endocrine disorders: Secondary | ICD-10-CM | POA: Diagnosis not present

## 2022-09-13 DIAGNOSIS — I700 Atherosclerosis of aorta: Secondary | ICD-10-CM | POA: Diagnosis not present

## 2022-09-20 ENCOUNTER — Telehealth: Payer: Self-pay | Admitting: Family Medicine

## 2022-09-20 NOTE — Telephone Encounter (Signed)
Called Amy [phone] and informed of verbal orders.

## 2022-09-20 NOTE — Telephone Encounter (Signed)
Verbal orders for physical therapy twice a week for 4 weeks and once a week for 2 weeks. [id number]

## 2022-09-20 NOTE — Telephone Encounter (Signed)
May have verbal orders thank you 

## 2022-09-23 DIAGNOSIS — E785 Hyperlipidemia, unspecified: Secondary | ICD-10-CM | POA: Diagnosis not present

## 2022-09-23 DIAGNOSIS — I152 Hypertension secondary to endocrine disorders: Secondary | ICD-10-CM | POA: Diagnosis not present

## 2022-09-23 DIAGNOSIS — I700 Atherosclerosis of aorta: Secondary | ICD-10-CM | POA: Diagnosis not present

## 2022-09-23 DIAGNOSIS — E1142 Type 2 diabetes mellitus with diabetic polyneuropathy: Secondary | ICD-10-CM | POA: Diagnosis not present

## 2022-09-23 DIAGNOSIS — E1159 Type 2 diabetes mellitus with other circulatory complications: Secondary | ICD-10-CM | POA: Diagnosis not present

## 2022-09-23 DIAGNOSIS — E1169 Type 2 diabetes mellitus with other specified complication: Secondary | ICD-10-CM | POA: Diagnosis not present

## 2022-09-24 ENCOUNTER — Telehealth (INDEPENDENT_AMBULATORY_CARE_PROVIDER_SITE_OTHER): Payer: Medicare Other | Admitting: Family Medicine

## 2022-09-24 DIAGNOSIS — E785 Hyperlipidemia, unspecified: Secondary | ICD-10-CM | POA: Diagnosis not present

## 2022-09-24 DIAGNOSIS — E1159 Type 2 diabetes mellitus with other circulatory complications: Secondary | ICD-10-CM | POA: Diagnosis not present

## 2022-09-24 DIAGNOSIS — I700 Atherosclerosis of aorta: Secondary | ICD-10-CM | POA: Diagnosis not present

## 2022-09-24 DIAGNOSIS — E1142 Type 2 diabetes mellitus with diabetic polyneuropathy: Secondary | ICD-10-CM | POA: Diagnosis not present

## 2022-09-24 DIAGNOSIS — I152 Hypertension secondary to endocrine disorders: Secondary | ICD-10-CM | POA: Diagnosis not present

## 2022-09-24 DIAGNOSIS — L98491 Non-pressure chronic ulcer of skin of other sites limited to breakdown of skin: Secondary | ICD-10-CM | POA: Diagnosis not present

## 2022-09-24 DIAGNOSIS — E1169 Type 2 diabetes mellitus with other specified complication: Secondary | ICD-10-CM | POA: Diagnosis not present

## 2022-09-24 MED ORDER — DOXYCYCLINE HYCLATE 100 MG PO TABS: 100.0000 mg | ORAL_TABLET | Freq: Two times a day (BID) | ORAL | 0 refills | Status: DC

## 2022-09-24 NOTE — Progress Notes (Unsigned)
   Subjective:    Patient ID: [name], female    DOB: May 11, 1932, 89 y.o.   MRN: [id number]  HPI Patient has two bed sores on bottom that may need antibiotic according to [facility].  He is being followed by home health nurse with bedsores on the bottom.  They are not deep.  But one of them is red around the edge and tender patient states it was draining some Nail not draining they are requesting doxycycline  Review of Systems     Objective:   Physical Exam Patient had virtual visit-video Appears to be in no distress Atraumatic Neuro able to relate and oriented No apparent resp distress Color normal Virtual Visit via Video Note  I connected with [name] on 09/25/22 at  9:00 AM EDT by a video enabled telemedicine application and verified that I am speaking with the correct person using two identifiers.  Location: [facility]: [facility] Provider: Office   I discussed the limitations of evaluation and management by telemedicine and the availability of in person appointments. The patient expressed understanding and agreed to proceed.  History of Present Illness:    Observations/Objective:   Assessment and Plan:   Follow Up Instructions:    I discussed the assessment and treatment plan with the patient. The patient was provided an opportunity to ask questions and all were answered. The patient agreed with the plan and demonstrated an understanding of the instructions.   The patient was advised to call back or seek an in-person evaluation if the symptoms worsen or if the condition fails to improve as anticipated.  I provided 15 minutes of non-face-to-face time during this encounter.   Sallee Lange, MD        Assessment & Plan:  Bedsores on the bottom Seem to be under decent control Home health is working with him They will send [place] some messages regarding this Patient will keep her regular follow-up visits Doxycycline twice daily per recommendation of  home health wound care nurse twice daily for 7 days take with a snack tall glass of water  History of C. difficile but benefits of treating with medicine outweigh risks

## 2022-09-27 ENCOUNTER — Encounter: Payer: Self-pay | Admitting: *Deleted

## 2022-09-27 DIAGNOSIS — I152 Hypertension secondary to endocrine disorders: Secondary | ICD-10-CM | POA: Diagnosis not present

## 2022-09-27 DIAGNOSIS — E1169 Type 2 diabetes mellitus with other specified complication: Secondary | ICD-10-CM | POA: Diagnosis not present

## 2022-09-27 DIAGNOSIS — I700 Atherosclerosis of aorta: Secondary | ICD-10-CM | POA: Diagnosis not present

## 2022-09-27 DIAGNOSIS — E785 Hyperlipidemia, unspecified: Secondary | ICD-10-CM | POA: Diagnosis not present

## 2022-09-27 DIAGNOSIS — E1159 Type 2 diabetes mellitus with other circulatory complications: Secondary | ICD-10-CM | POA: Diagnosis not present

## 2022-09-27 DIAGNOSIS — E1142 Type 2 diabetes mellitus with diabetic polyneuropathy: Secondary | ICD-10-CM | POA: Diagnosis not present

## 2022-09-28 DIAGNOSIS — E1142 Type 2 diabetes mellitus with diabetic polyneuropathy: Secondary | ICD-10-CM | POA: Diagnosis not present

## 2022-09-28 DIAGNOSIS — E1169 Type 2 diabetes mellitus with other specified complication: Secondary | ICD-10-CM | POA: Diagnosis not present

## 2022-09-28 DIAGNOSIS — E1159 Type 2 diabetes mellitus with other circulatory complications: Secondary | ICD-10-CM | POA: Diagnosis not present

## 2022-09-28 DIAGNOSIS — E785 Hyperlipidemia, unspecified: Secondary | ICD-10-CM | POA: Diagnosis not present

## 2022-09-28 DIAGNOSIS — I700 Atherosclerosis of aorta: Secondary | ICD-10-CM | POA: Diagnosis not present

## 2022-09-28 DIAGNOSIS — I152 Hypertension secondary to endocrine disorders: Secondary | ICD-10-CM | POA: Diagnosis not present

## 2022-09-30 DIAGNOSIS — I700 Atherosclerosis of aorta: Secondary | ICD-10-CM | POA: Diagnosis not present

## 2022-09-30 DIAGNOSIS — I152 Hypertension secondary to endocrine disorders: Secondary | ICD-10-CM | POA: Diagnosis not present

## 2022-09-30 DIAGNOSIS — E1142 Type 2 diabetes mellitus with diabetic polyneuropathy: Secondary | ICD-10-CM | POA: Diagnosis not present

## 2022-09-30 DIAGNOSIS — E1169 Type 2 diabetes mellitus with other specified complication: Secondary | ICD-10-CM | POA: Diagnosis not present

## 2022-09-30 DIAGNOSIS — E785 Hyperlipidemia, unspecified: Secondary | ICD-10-CM | POA: Diagnosis not present

## 2022-09-30 DIAGNOSIS — M1711 Unilateral primary osteoarthritis, right knee: Secondary | ICD-10-CM | POA: Diagnosis not present

## 2022-09-30 DIAGNOSIS — E1159 Type 2 diabetes mellitus with other circulatory complications: Secondary | ICD-10-CM | POA: Diagnosis not present

## 2022-10-02 DIAGNOSIS — E1159 Type 2 diabetes mellitus with other circulatory complications: Secondary | ICD-10-CM | POA: Diagnosis not present

## 2022-10-02 DIAGNOSIS — E1169 Type 2 diabetes mellitus with other specified complication: Secondary | ICD-10-CM | POA: Diagnosis not present

## 2022-10-02 DIAGNOSIS — E1142 Type 2 diabetes mellitus with diabetic polyneuropathy: Secondary | ICD-10-CM | POA: Diagnosis not present

## 2022-10-02 DIAGNOSIS — I700 Atherosclerosis of aorta: Secondary | ICD-10-CM | POA: Diagnosis not present

## 2022-10-02 DIAGNOSIS — I152 Hypertension secondary to endocrine disorders: Secondary | ICD-10-CM | POA: Diagnosis not present

## 2022-10-02 DIAGNOSIS — E785 Hyperlipidemia, unspecified: Secondary | ICD-10-CM | POA: Diagnosis not present

## 2022-10-03 DIAGNOSIS — E1159 Type 2 diabetes mellitus with other circulatory complications: Secondary | ICD-10-CM | POA: Diagnosis not present

## 2022-10-03 DIAGNOSIS — E1142 Type 2 diabetes mellitus with diabetic polyneuropathy: Secondary | ICD-10-CM | POA: Diagnosis not present

## 2022-10-03 DIAGNOSIS — E1169 Type 2 diabetes mellitus with other specified complication: Secondary | ICD-10-CM | POA: Diagnosis not present

## 2022-10-03 DIAGNOSIS — I152 Hypertension secondary to endocrine disorders: Secondary | ICD-10-CM | POA: Diagnosis not present

## 2022-10-03 DIAGNOSIS — E785 Hyperlipidemia, unspecified: Secondary | ICD-10-CM | POA: Diagnosis not present

## 2022-10-03 DIAGNOSIS — I700 Atherosclerosis of aorta: Secondary | ICD-10-CM | POA: Diagnosis not present

## 2022-10-04 DIAGNOSIS — E785 Hyperlipidemia, unspecified: Secondary | ICD-10-CM | POA: Diagnosis not present

## 2022-10-04 DIAGNOSIS — I700 Atherosclerosis of aorta: Secondary | ICD-10-CM | POA: Diagnosis not present

## 2022-10-04 DIAGNOSIS — E1142 Type 2 diabetes mellitus with diabetic polyneuropathy: Secondary | ICD-10-CM | POA: Diagnosis not present

## 2022-10-04 DIAGNOSIS — E1169 Type 2 diabetes mellitus with other specified complication: Secondary | ICD-10-CM | POA: Diagnosis not present

## 2022-10-04 DIAGNOSIS — I152 Hypertension secondary to endocrine disorders: Secondary | ICD-10-CM | POA: Diagnosis not present

## 2022-10-04 DIAGNOSIS — E1159 Type 2 diabetes mellitus with other circulatory complications: Secondary | ICD-10-CM | POA: Diagnosis not present

## 2022-10-07 DIAGNOSIS — E1169 Type 2 diabetes mellitus with other specified complication: Secondary | ICD-10-CM | POA: Diagnosis not present

## 2022-10-07 DIAGNOSIS — E785 Hyperlipidemia, unspecified: Secondary | ICD-10-CM | POA: Diagnosis not present

## 2022-10-07 DIAGNOSIS — I700 Atherosclerosis of aorta: Secondary | ICD-10-CM | POA: Diagnosis not present

## 2022-10-07 DIAGNOSIS — E1159 Type 2 diabetes mellitus with other circulatory complications: Secondary | ICD-10-CM | POA: Diagnosis not present

## 2022-10-07 DIAGNOSIS — E1142 Type 2 diabetes mellitus with diabetic polyneuropathy: Secondary | ICD-10-CM | POA: Diagnosis not present

## 2022-10-07 DIAGNOSIS — I152 Hypertension secondary to endocrine disorders: Secondary | ICD-10-CM | POA: Diagnosis not present

## 2022-10-08 DIAGNOSIS — E1142 Type 2 diabetes mellitus with diabetic polyneuropathy: Secondary | ICD-10-CM

## 2022-10-08 DIAGNOSIS — E785 Hyperlipidemia, unspecified: Secondary | ICD-10-CM

## 2022-10-08 DIAGNOSIS — Z7984 Long term (current) use of oral hypoglycemic drugs: Secondary | ICD-10-CM

## 2022-10-08 DIAGNOSIS — Z96651 Presence of right artificial knee joint: Secondary | ICD-10-CM

## 2022-10-08 DIAGNOSIS — E43 Unspecified severe protein-calorie malnutrition: Secondary | ICD-10-CM

## 2022-10-08 DIAGNOSIS — F411 Generalized anxiety disorder: Secondary | ICD-10-CM

## 2022-10-08 DIAGNOSIS — I700 Atherosclerosis of aorta: Secondary | ICD-10-CM

## 2022-10-08 DIAGNOSIS — E1159 Type 2 diabetes mellitus with other circulatory complications: Secondary | ICD-10-CM

## 2022-10-08 DIAGNOSIS — E1169 Type 2 diabetes mellitus with other specified complication: Secondary | ICD-10-CM

## 2022-10-08 DIAGNOSIS — I152 Hypertension secondary to endocrine disorders: Secondary | ICD-10-CM

## 2022-10-10 ENCOUNTER — Telehealth: Payer: Self-pay

## 2022-10-10 NOTE — Telephone Encounter (Signed)
Patient dropped off document  Diet Order  , to be filled out by provider. Patient requested to send it via Call Patient to pick up within 7-days. Document is located in providers tray at front office.Please advise at Mobile [phone] (mobile)

## 2022-10-11 DIAGNOSIS — E1159 Type 2 diabetes mellitus with other circulatory complications: Secondary | ICD-10-CM | POA: Diagnosis not present

## 2022-10-11 DIAGNOSIS — E785 Hyperlipidemia, unspecified: Secondary | ICD-10-CM | POA: Diagnosis not present

## 2022-10-11 DIAGNOSIS — I152 Hypertension secondary to endocrine disorders: Secondary | ICD-10-CM | POA: Diagnosis not present

## 2022-10-11 DIAGNOSIS — I700 Atherosclerosis of aorta: Secondary | ICD-10-CM | POA: Diagnosis not present

## 2022-10-11 DIAGNOSIS — E1169 Type 2 diabetes mellitus with other specified complication: Secondary | ICD-10-CM | POA: Diagnosis not present

## 2022-10-11 DIAGNOSIS — E1142 Type 2 diabetes mellitus with diabetic polyneuropathy: Secondary | ICD-10-CM | POA: Diagnosis not present

## 2022-10-12 NOTE — Telephone Encounter (Signed)
This form was filled out as requested thank you 

## 2022-10-13 DIAGNOSIS — E1169 Type 2 diabetes mellitus with other specified complication: Secondary | ICD-10-CM | POA: Diagnosis not present

## 2022-10-13 DIAGNOSIS — E43 Unspecified severe protein-calorie malnutrition: Secondary | ICD-10-CM | POA: Diagnosis not present

## 2022-10-13 DIAGNOSIS — Z7984 Long term (current) use of oral hypoglycemic drugs: Secondary | ICD-10-CM | POA: Diagnosis not present

## 2022-10-13 DIAGNOSIS — E1159 Type 2 diabetes mellitus with other circulatory complications: Secondary | ICD-10-CM | POA: Diagnosis not present

## 2022-10-13 DIAGNOSIS — E1142 Type 2 diabetes mellitus with diabetic polyneuropathy: Secondary | ICD-10-CM | POA: Diagnosis not present

## 2022-10-13 DIAGNOSIS — E785 Hyperlipidemia, unspecified: Secondary | ICD-10-CM | POA: Diagnosis not present

## 2022-10-13 DIAGNOSIS — Z96651 Presence of right artificial knee joint: Secondary | ICD-10-CM | POA: Diagnosis not present

## 2022-10-13 DIAGNOSIS — I152 Hypertension secondary to endocrine disorders: Secondary | ICD-10-CM | POA: Diagnosis not present

## 2022-10-13 DIAGNOSIS — I700 Atherosclerosis of aorta: Secondary | ICD-10-CM | POA: Diagnosis not present

## 2022-10-13 DIAGNOSIS — F411 Generalized anxiety disorder: Secondary | ICD-10-CM | POA: Diagnosis not present

## 2022-10-14 DIAGNOSIS — I700 Atherosclerosis of aorta: Secondary | ICD-10-CM | POA: Diagnosis not present

## 2022-10-14 DIAGNOSIS — I152 Hypertension secondary to endocrine disorders: Secondary | ICD-10-CM | POA: Diagnosis not present

## 2022-10-14 DIAGNOSIS — E1169 Type 2 diabetes mellitus with other specified complication: Secondary | ICD-10-CM | POA: Diagnosis not present

## 2022-10-14 DIAGNOSIS — E1159 Type 2 diabetes mellitus with other circulatory complications: Secondary | ICD-10-CM | POA: Diagnosis not present

## 2022-10-14 DIAGNOSIS — E1142 Type 2 diabetes mellitus with diabetic polyneuropathy: Secondary | ICD-10-CM | POA: Diagnosis not present

## 2022-10-14 DIAGNOSIS — E785 Hyperlipidemia, unspecified: Secondary | ICD-10-CM | POA: Diagnosis not present

## 2022-10-17 DIAGNOSIS — E1169 Type 2 diabetes mellitus with other specified complication: Secondary | ICD-10-CM | POA: Diagnosis not present

## 2022-10-17 DIAGNOSIS — E1159 Type 2 diabetes mellitus with other circulatory complications: Secondary | ICD-10-CM | POA: Diagnosis not present

## 2022-10-17 DIAGNOSIS — E785 Hyperlipidemia, unspecified: Secondary | ICD-10-CM | POA: Diagnosis not present

## 2022-10-17 DIAGNOSIS — E1142 Type 2 diabetes mellitus with diabetic polyneuropathy: Secondary | ICD-10-CM | POA: Diagnosis not present

## 2022-10-17 DIAGNOSIS — I152 Hypertension secondary to endocrine disorders: Secondary | ICD-10-CM | POA: Diagnosis not present

## 2022-10-17 DIAGNOSIS — I700 Atherosclerosis of aorta: Secondary | ICD-10-CM | POA: Diagnosis not present

## 2022-10-18 DIAGNOSIS — E1169 Type 2 diabetes mellitus with other specified complication: Secondary | ICD-10-CM | POA: Diagnosis not present

## 2022-10-18 DIAGNOSIS — E1159 Type 2 diabetes mellitus with other circulatory complications: Secondary | ICD-10-CM | POA: Diagnosis not present

## 2022-10-18 DIAGNOSIS — I152 Hypertension secondary to endocrine disorders: Secondary | ICD-10-CM | POA: Diagnosis not present

## 2022-10-18 DIAGNOSIS — E1142 Type 2 diabetes mellitus with diabetic polyneuropathy: Secondary | ICD-10-CM | POA: Diagnosis not present

## 2022-10-18 DIAGNOSIS — E785 Hyperlipidemia, unspecified: Secondary | ICD-10-CM | POA: Diagnosis not present

## 2022-10-18 DIAGNOSIS — I700 Atherosclerosis of aorta: Secondary | ICD-10-CM | POA: Diagnosis not present

## 2022-10-21 DIAGNOSIS — E1159 Type 2 diabetes mellitus with other circulatory complications: Secondary | ICD-10-CM | POA: Diagnosis not present

## 2022-10-21 DIAGNOSIS — E785 Hyperlipidemia, unspecified: Secondary | ICD-10-CM | POA: Diagnosis not present

## 2022-10-21 DIAGNOSIS — I152 Hypertension secondary to endocrine disorders: Secondary | ICD-10-CM | POA: Diagnosis not present

## 2022-10-21 DIAGNOSIS — E1142 Type 2 diabetes mellitus with diabetic polyneuropathy: Secondary | ICD-10-CM | POA: Diagnosis not present

## 2022-10-21 DIAGNOSIS — E1169 Type 2 diabetes mellitus with other specified complication: Secondary | ICD-10-CM | POA: Diagnosis not present

## 2022-10-21 DIAGNOSIS — I700 Atherosclerosis of aorta: Secondary | ICD-10-CM | POA: Diagnosis not present

## 2022-10-22 DIAGNOSIS — E1159 Type 2 diabetes mellitus with other circulatory complications: Secondary | ICD-10-CM | POA: Diagnosis not present

## 2022-10-22 DIAGNOSIS — E1142 Type 2 diabetes mellitus with diabetic polyneuropathy: Secondary | ICD-10-CM | POA: Diagnosis not present

## 2022-10-22 DIAGNOSIS — I700 Atherosclerosis of aorta: Secondary | ICD-10-CM | POA: Diagnosis not present

## 2022-10-22 DIAGNOSIS — I152 Hypertension secondary to endocrine disorders: Secondary | ICD-10-CM | POA: Diagnosis not present

## 2022-10-22 DIAGNOSIS — E1169 Type 2 diabetes mellitus with other specified complication: Secondary | ICD-10-CM | POA: Diagnosis not present

## 2022-10-22 DIAGNOSIS — E785 Hyperlipidemia, unspecified: Secondary | ICD-10-CM | POA: Diagnosis not present

## 2022-10-24 ENCOUNTER — Other Ambulatory Visit: Payer: Self-pay | Admitting: Family Medicine

## 2022-10-25 DIAGNOSIS — E1159 Type 2 diabetes mellitus with other circulatory complications: Secondary | ICD-10-CM | POA: Diagnosis not present

## 2022-10-25 DIAGNOSIS — I700 Atherosclerosis of aorta: Secondary | ICD-10-CM | POA: Diagnosis not present

## 2022-10-25 DIAGNOSIS — E1169 Type 2 diabetes mellitus with other specified complication: Secondary | ICD-10-CM | POA: Diagnosis not present

## 2022-10-25 DIAGNOSIS — I152 Hypertension secondary to endocrine disorders: Secondary | ICD-10-CM | POA: Diagnosis not present

## 2022-10-25 DIAGNOSIS — E1142 Type 2 diabetes mellitus with diabetic polyneuropathy: Secondary | ICD-10-CM | POA: Diagnosis not present

## 2022-10-25 DIAGNOSIS — E785 Hyperlipidemia, unspecified: Secondary | ICD-10-CM | POA: Diagnosis not present

## 2022-10-28 DIAGNOSIS — I152 Hypertension secondary to endocrine disorders: Secondary | ICD-10-CM | POA: Diagnosis not present

## 2022-10-28 DIAGNOSIS — E1142 Type 2 diabetes mellitus with diabetic polyneuropathy: Secondary | ICD-10-CM | POA: Diagnosis not present

## 2022-10-28 DIAGNOSIS — E1169 Type 2 diabetes mellitus with other specified complication: Secondary | ICD-10-CM | POA: Diagnosis not present

## 2022-10-28 DIAGNOSIS — E785 Hyperlipidemia, unspecified: Secondary | ICD-10-CM | POA: Diagnosis not present

## 2022-10-28 DIAGNOSIS — E1159 Type 2 diabetes mellitus with other circulatory complications: Secondary | ICD-10-CM | POA: Diagnosis not present

## 2022-10-28 DIAGNOSIS — I700 Atherosclerosis of aorta: Secondary | ICD-10-CM | POA: Diagnosis not present

## 2022-10-31 DIAGNOSIS — I700 Atherosclerosis of aorta: Secondary | ICD-10-CM | POA: Diagnosis not present

## 2022-10-31 DIAGNOSIS — E785 Hyperlipidemia, unspecified: Secondary | ICD-10-CM | POA: Diagnosis not present

## 2022-10-31 DIAGNOSIS — E1159 Type 2 diabetes mellitus with other circulatory complications: Secondary | ICD-10-CM | POA: Diagnosis not present

## 2022-10-31 DIAGNOSIS — I152 Hypertension secondary to endocrine disorders: Secondary | ICD-10-CM | POA: Diagnosis not present

## 2022-10-31 DIAGNOSIS — E1169 Type 2 diabetes mellitus with other specified complication: Secondary | ICD-10-CM | POA: Diagnosis not present

## 2022-10-31 DIAGNOSIS — E1142 Type 2 diabetes mellitus with diabetic polyneuropathy: Secondary | ICD-10-CM | POA: Diagnosis not present

## 2022-11-05 DIAGNOSIS — E1142 Type 2 diabetes mellitus with diabetic polyneuropathy: Secondary | ICD-10-CM | POA: Diagnosis not present

## 2022-11-05 DIAGNOSIS — E1159 Type 2 diabetes mellitus with other circulatory complications: Secondary | ICD-10-CM | POA: Diagnosis not present

## 2022-11-05 DIAGNOSIS — E785 Hyperlipidemia, unspecified: Secondary | ICD-10-CM | POA: Diagnosis not present

## 2022-11-05 DIAGNOSIS — E1169 Type 2 diabetes mellitus with other specified complication: Secondary | ICD-10-CM | POA: Diagnosis not present

## 2022-11-05 DIAGNOSIS — I152 Hypertension secondary to endocrine disorders: Secondary | ICD-10-CM | POA: Diagnosis not present

## 2022-11-05 DIAGNOSIS — I700 Atherosclerosis of aorta: Secondary | ICD-10-CM | POA: Diagnosis not present

## 2022-11-07 DIAGNOSIS — B351 Tinea unguium: Secondary | ICD-10-CM | POA: Diagnosis not present

## 2022-11-07 DIAGNOSIS — L84 Corns and callosities: Secondary | ICD-10-CM | POA: Diagnosis not present

## 2022-11-07 DIAGNOSIS — I152 Hypertension secondary to endocrine disorders: Secondary | ICD-10-CM | POA: Diagnosis not present

## 2022-11-07 DIAGNOSIS — I700 Atherosclerosis of aorta: Secondary | ICD-10-CM | POA: Diagnosis not present

## 2022-11-07 DIAGNOSIS — E785 Hyperlipidemia, unspecified: Secondary | ICD-10-CM | POA: Diagnosis not present

## 2022-11-07 DIAGNOSIS — E1142 Type 2 diabetes mellitus with diabetic polyneuropathy: Secondary | ICD-10-CM | POA: Diagnosis not present

## 2022-11-07 DIAGNOSIS — E1159 Type 2 diabetes mellitus with other circulatory complications: Secondary | ICD-10-CM | POA: Diagnosis not present

## 2022-11-07 DIAGNOSIS — E1169 Type 2 diabetes mellitus with other specified complication: Secondary | ICD-10-CM | POA: Diagnosis not present

## 2022-11-11 DIAGNOSIS — I700 Atherosclerosis of aorta: Secondary | ICD-10-CM | POA: Diagnosis not present

## 2022-11-11 DIAGNOSIS — E119 Type 2 diabetes mellitus without complications: Secondary | ICD-10-CM | POA: Diagnosis not present

## 2022-11-11 DIAGNOSIS — E785 Hyperlipidemia, unspecified: Secondary | ICD-10-CM | POA: Diagnosis not present

## 2022-11-11 DIAGNOSIS — I152 Hypertension secondary to endocrine disorders: Secondary | ICD-10-CM | POA: Diagnosis not present

## 2022-11-11 DIAGNOSIS — E1159 Type 2 diabetes mellitus with other circulatory complications: Secondary | ICD-10-CM | POA: Diagnosis not present

## 2022-11-11 DIAGNOSIS — E1142 Type 2 diabetes mellitus with diabetic polyneuropathy: Secondary | ICD-10-CM | POA: Diagnosis not present

## 2022-11-11 DIAGNOSIS — E1169 Type 2 diabetes mellitus with other specified complication: Secondary | ICD-10-CM | POA: Diagnosis not present

## 2022-11-12 LAB — BASIC METABOLIC PANEL
BUN/Creatinine Ratio: 20 (ref 12–28)
BUN: 19 mg/dL (ref 8–27)
CO2: 24 mmol/L (ref 20–29)
Calcium: 10 mg/dL (ref 8.7–10.3)
Chloride: 97 mmol/L (ref 96–106)
Creatinine, Ser: 0.95 mg/dL (ref 0.57–1.00)
Glucose: 125 mg/dL — ABNORMAL HIGH (ref 70–99)
Potassium: 5 mmol/L (ref 3.5–5.2)
Sodium: 137 mmol/L (ref 134–144)
eGFR: 57 mL/min/{1.73_m2} — ABNORMAL LOW (ref >59)

## 2022-11-12 LAB — LIPID PANEL
Chol/HDL Ratio: 4.4 ratio (ref 0.0–4.4)
Cholesterol, Total: 196 mg/dL (ref 100–199)
HDL: 45 mg/dL (ref >39)
LDL Chol Calc (NIH): 115 mg/dL — ABNORMAL HIGH (ref 0–99)
Triglycerides: 209 mg/dL — ABNORMAL HIGH (ref 0–149)
VLDL Cholesterol Cal: 36 mg/dL (ref 5–40)

## 2022-11-12 LAB — HEMOGLOBIN A1C
Est. average glucose Bld gHb Est-mCnc: 148 mg/dL
Hgb A1c MFr Bld: 6.8 % — ABNORMAL HIGH (ref 4.8–5.6)

## 2022-11-18 DIAGNOSIS — H353113 Nonexudative age-related macular degeneration, right eye, advanced atrophic without subfoveal involvement: Secondary | ICD-10-CM | POA: Diagnosis not present

## 2022-11-18 DIAGNOSIS — H43811 Vitreous degeneration, right eye: Secondary | ICD-10-CM | POA: Diagnosis not present

## 2022-11-18 DIAGNOSIS — H4423 Degenerative myopia, bilateral: Secondary | ICD-10-CM | POA: Diagnosis not present

## 2022-11-18 DIAGNOSIS — H35033 Hypertensive retinopathy, bilateral: Secondary | ICD-10-CM | POA: Diagnosis not present

## 2022-11-19 ENCOUNTER — Other Ambulatory Visit: Payer: Self-pay | Admitting: Internal Medicine

## 2022-11-19 ENCOUNTER — Ambulatory Visit (INDEPENDENT_AMBULATORY_CARE_PROVIDER_SITE_OTHER): Payer: Medicare Other | Admitting: Family Medicine

## 2022-11-19 ENCOUNTER — Encounter: Payer: Self-pay | Admitting: Family Medicine

## 2022-11-19 VITALS — BP 138/86 | HR 71 | Wt 169.4 lb

## 2022-11-19 DIAGNOSIS — E785 Hyperlipidemia, unspecified: Secondary | ICD-10-CM

## 2022-11-19 DIAGNOSIS — K219 Gastro-esophageal reflux disease without esophagitis: Secondary | ICD-10-CM

## 2022-11-19 DIAGNOSIS — E1169 Type 2 diabetes mellitus with other specified complication: Secondary | ICD-10-CM | POA: Diagnosis not present

## 2022-11-19 DIAGNOSIS — I10 Essential (primary) hypertension: Secondary | ICD-10-CM | POA: Diagnosis not present

## 2022-11-19 DIAGNOSIS — E119 Type 2 diabetes mellitus without complications: Secondary | ICD-10-CM

## 2022-11-19 DIAGNOSIS — I700 Atherosclerosis of aorta: Secondary | ICD-10-CM | POA: Diagnosis not present

## 2022-11-19 DIAGNOSIS — T466X5D Adverse effect of antihyperlipidemic and antiarteriosclerotic drugs, subsequent encounter: Secondary | ICD-10-CM

## 2022-11-19 DIAGNOSIS — M791 Myalgia, unspecified site: Secondary | ICD-10-CM | POA: Diagnosis not present

## 2022-11-19 DIAGNOSIS — T466X5A Adverse effect of antihyperlipidemic and antiarteriosclerotic drugs, initial encounter: Secondary | ICD-10-CM

## 2022-11-19 MED ORDER — PANTOPRAZOLE SODIUM 20 MG PO TBEC: DELAYED_RELEASE_TABLET | ORAL | 5 refills | Status: DC

## 2022-11-19 MED ORDER — AMLODIPINE BESYLATE 10 MG PO TABS: 10.0000 mg | ORAL_TABLET | Freq: Every day | ORAL | 6 refills | Status: DC

## 2022-11-19 MED ORDER — LOSARTAN POTASSIUM 100 MG PO TABS: ORAL_TABLET | ORAL | 6 refills | Status: DC

## 2022-11-19 MED ORDER — FAMOTIDINE 20 MG PO TABS: ORAL_TABLET | ORAL | 5 refills | Status: DC

## 2022-11-19 MED ORDER — GABAPENTIN 100 MG PO CAPS: ORAL_CAPSULE | ORAL | 6 refills | Status: DC

## 2022-11-19 MED ORDER — METFORMIN HCL 500 MG PO TABS: ORAL_TABLET | ORAL | 6 refills | Status: DC

## 2022-11-19 NOTE — Progress Notes (Signed)
Subjective:    Patient ID: [name], female    DOB: [birth year], 89 y.o.   MRN: [id number]  HPI Patient arrives today for diabetes. Outpatient Encounter Medications as of 11/19/2022  Medication Sig   acetaminophen (TYLENOL) 325 MG tablet TAKE 2 TABLETS(650MG ) BY MOUTH EVERY 6 HOURS AS NEEDED FOR PAIN/TEMPERATURE.   ALLERGY RELIEF 10 MG tablet TAKE (1) TABLET BY MOUTH ONCE DAILY.   beta carotene (BETA CAROTENE PROVITAMIN A) 25000 UNIT capsule TAKE (1) CAPSULE BY MOUTH AT BEDTIME.   beta carotene w/minerals (OCUVITE) tablet Take 1 tablet by mouth at bedtime.   bisacodyl (DULCOLAX) 5 MG EC tablet Take 1 tablet (5 mg total) by mouth daily as needed for moderate constipation.   Carboxymethylcellulose Sodium (THERATEARS) 0.25 % SOLN Apply 1 drop to eye in the morning and at bedtime.   cyanocobalamin 1000 MCG tablet Take 1,000 mcg by mouth at bedtime.   dicyclomine (BENTYL) 10 MG capsule 1 bid prn abd cramps and loose stool   EASYMAX TEST test strip CHECK BLOOD SUGAR ONCE DAILY.(CALL MD IF BS BELOW 60: OR IF BS ABOVE 400)   GOODSENSE HEMORRHOIDAL 0.25-14-74.9 % rectal ointment Place 1 Application rectally 3 (three) times daily as needed for irritation.   Ipratropium-Albuterol (COMBIVENT RESPIMAT) 20-100 MCG/ACT AERS respimat Inhale 1 puff into the lungs every 6 (six) hours as needed for wheezing or shortness of breath.   Melatonin 3 MG SUBL TAKE (1) TABLET BY MOUTH AT BEDTIME.   Multiple Vitamins-Minerals (PRESERVISION AREDS 2) CAPS Take 1 capsule by mouth in the morning and at bedtime.   Omega-3 Fatty Acids (FISH OIL) 1000 MG CAPS One capsule BID   Vitamin D, Cholecalciferol, 10 MCG (400 UNIT) TABS TAKE 1 TABLET BY MOUTH ONCE DAILY.   [DISCONTINUED] ALPRAZolam (XANAX) 0.25 MG tablet 1 q am for 2 weeks   [DISCONTINUED] amLODipine (NORVASC) 10 MG tablet Take 1 tablet (10 mg total) by mouth daily.   [DISCONTINUED] doxycycline (VIBRA-TABS) 100 MG tablet Take 1 tablet (100 mg total) by mouth 2  (two) times daily.   [DISCONTINUED] famotidine (PEPCID) 20 MG tablet TAKE 1 TABLET BY MOUTH EACH MORNING.   [DISCONTINUED] gabapentin (NEURONTIN) 100 MG capsule Take 3 capsules (300 mg total) by mouth 2 (two) times daily.   [DISCONTINUED] losartan (COZAAR) 100 MG tablet Take 1 tablet daily   [DISCONTINUED] metFORMIN (GLUCOPHAGE) 500 MG tablet Take one tablet 500mg  po in the morning and 1/2 tablet 250 mg po at supper   [DISCONTINUED] pantoprazole (PROTONIX) 20 MG tablet TAKE (1) TABLET BY MOUTH ONCE DAILY.   amLODipine (NORVASC) 10 MG tablet Take 1 tablet (10 mg total) by mouth daily.   famotidine (PEPCID) 20 MG tablet TAKE 1 TABLET BY MOUTH EACH MORNING.   gabapentin (NEURONTIN) 100 MG capsule 2 po bid   losartan (COZAAR) 100 MG tablet Take 1 tablet daily   metFORMIN (GLUCOPHAGE) 500 MG tablet Take one tablet 500mg  po in the morning and 1/2 tablet 250 mg po at supper   pantoprazole (PROTONIX) 20 MG tablet TAKE (1) TABLET BY MOUTH ONCE DAILY.   No facility-administered encounter medications on file as of 11/19/2022.    Results for orders placed or performed in visit on 09/27/22  HM DIABETES EYE EXAM  Result Value Ref Range   HM Diabetic Eye Exam No Retinopathy No Retinopathy  A1c 6.8, LDL 115, GFR 57 creatinine looks good, electrolytes look good  The patient was seen today as part of a comprehensive diabetic check up. Patient  has diabetes Patient relates good compliance with taking the medication. We discussed their diet and exercise activities  We also discussed the importance of notifying us if any excessively high glucoses or low sugars.    Patient here for follow-up regarding cholesterol.    Patient relates taking medication on a regular basis Denies problems with medication Importance of dietary measures discussed Regular lab work regarding lipid and liver was checked and if needing additional labs was appropriately ordered  Controlled type 2 diabetes mellitus without  complication, without long-term current use of insulin (HCC) - Plan: metFORMIN (GLUCOPHAGE) 500 MG tablet  Essential hypertension - Plan: amLODipine (NORVASC) 10 MG tablet, losartan (COZAAR) 100 MG tablet  Hyperlipidemia associated with type 2 diabetes mellitus (HCC)  Myalgia due to statin  Aortic atherosclerosis (HCC), Chronic  Gastroesophageal reflux disease without esophagitis - Plan: pantoprazole (PROTONIX) 20 MG tablet   Review of Systems     Objective:   Physical Exam General-in no acute distress Eyes-no discharge Lungs-respiratory rate normal, CTA CV-no murmurs,RRR Extremities skin warm dry no edema Neuro grossly normal Behavior normal, alert        Assessment & Plan:  1. Controlled type 2 diabetes mellitus without complication, without long-term current use of insulin (HCC) The patient was seen today as part of a comprehensive visit for diabetes. The importance of keeping her A1c at or below 7 range was discussed.  Discussed diet, activity, and medication compliance Emphasized healthy eating primarily with vegetables fruits and if utilizing meats lean meats such as chicken or fish grilled baked broiled Avoid sugary drinks Minimize and avoid processed foods Fit in regular physical activity preferably 25 to 30 minutes 4 times per week Standard follow-up visit recommended.  Patient aware lack of control and follow-up increases risk of diabetic complications. Regular follow-up visits Yearly ophthalmology Yearly foot exam - metFORMIN (GLUCOPHAGE) 500 MG tablet; Take one tablet 500mg  po in the morning and 1/2 tablet 250 mg po at supper  Dispense: 45 tablet; Refill: 6  2. Essential hypertension HTN- patient seen for follow-up regarding HTN.   Diet, medication compliance, appropriate labs and refills were completed.   Importance of keeping blood pressure under good control to lessen the risk of complications discussed Regular follow-up visits discussed  - amLODipine  (NORVASC) 10 MG tablet; Take 1 tablet (10 mg total) by mouth daily.  Dispense: 30 tablet; Refill: 6 - losartan (COZAAR) 100 MG tablet; Take 1 tablet daily  Dispense: 30 tablet; Refill: 6  3. Hyperlipidemia associated with type 2 diabetes mellitus (HCC) Hyperlipidemia-importance of diet, weight control, activity, compliance with medications discussed.   Recent labs reviewed.   Any additional labs or refills ordered.   Importance of keeping under good control discussed. Regular follow-up visits discussed   4. Myalgia due to statin Stay away from statins due to muscle pains and discomfort unable to tolerate  5. Aortic atherosclerosis (HCC) Healthy diet keep diabetes blood pressure under good control  6. Gastroesophageal reflux disease without esophagitis Protonix daily - pantoprazole (PROTONIX) 20 MG tablet; TAKE (1) TABLET BY MOUTH ONCE DAILY.  Dispense: 30 tablet; Refill: 5  Follow-up by late fall

## 2022-11-20 DIAGNOSIS — M6281 Muscle weakness (generalized): Secondary | ICD-10-CM | POA: Diagnosis not present

## 2022-11-21 DIAGNOSIS — M6281 Muscle weakness (generalized): Secondary | ICD-10-CM | POA: Diagnosis not present

## 2022-11-25 DIAGNOSIS — M6281 Muscle weakness (generalized): Secondary | ICD-10-CM | POA: Diagnosis not present

## 2022-11-26 ENCOUNTER — Telehealth: Payer: Self-pay

## 2022-11-26 DIAGNOSIS — M6281 Muscle weakness (generalized): Secondary | ICD-10-CM | POA: Diagnosis not present

## 2022-11-26 NOTE — Telephone Encounter (Signed)
Error

## 2022-11-27 ENCOUNTER — Ambulatory Visit: Payer: Medicare Other | Admitting: Family Medicine

## 2022-11-27 ENCOUNTER — Encounter: Payer: Self-pay | Admitting: Family Medicine

## 2022-11-27 ENCOUNTER — Ambulatory Visit (INDEPENDENT_AMBULATORY_CARE_PROVIDER_SITE_OTHER): Payer: Medicare Other | Admitting: Family Medicine

## 2022-11-27 VITALS — BP 138/68 | HR 77 | Temp 98.2°F | Wt 169.6 lb

## 2022-11-27 DIAGNOSIS — J019 Acute sinusitis, unspecified: Secondary | ICD-10-CM

## 2022-11-27 DIAGNOSIS — R278 Other lack of coordination: Secondary | ICD-10-CM | POA: Diagnosis not present

## 2022-11-27 DIAGNOSIS — M6281 Muscle weakness (generalized): Secondary | ICD-10-CM | POA: Diagnosis not present

## 2022-11-27 DIAGNOSIS — B9689 Other specified bacterial agents as the cause of diseases classified elsewhere: Secondary | ICD-10-CM | POA: Diagnosis not present

## 2022-11-27 MED ORDER — AMOXICILLIN 500 MG PO CAPS: 500.0000 mg | ORAL_CAPSULE | Freq: Three times a day (TID) | ORAL | 0 refills | Status: DC

## 2022-11-27 NOTE — Progress Notes (Signed)
   Subjective:    Patient ID: DORTHIE HOUSEKNECHT, female    DOB: 1932/05/17, 89 y.o.   MRN: [id number]  Sore Throat  Episode onset: Started Friday. The problem has been rapidly improving. There has been no fever. Associated symptoms include coughing.  Cough The current episode started in the past 7 days (friday). The problem has been gradually worsening (experincing chest congestion). The problem occurs constantly. The cough is Productive of sputum (White thick sputum).   Patient relates about 5-day history of head congestion drainage sore throat coughing wheezing no vomiting   Review of Systems  Respiratory:  Positive for cough.        Objective:   Physical Exam  General-in no acute distress Eyes-no discharge Lungs-respiratory rate normal, CTA CV-no murmurs,RRR Extremities skin warm dry no edema Neuro grossly normal Behavior normal, alert       Assessment & Plan:  Viral syndrome with secondary rhinosinusitis Amoxicillin 7 days COVID testing Warning signs discussed

## 2022-11-29 LAB — NOVEL CORONAVIRUS, NAA: SARS-CoV-2, NAA: NOT DETECTED

## 2022-11-30 ENCOUNTER — Emergency Department (HOSPITAL_COMMUNITY): Payer: Medicare Other

## 2022-11-30 ENCOUNTER — Other Ambulatory Visit: Payer: Self-pay

## 2022-11-30 ENCOUNTER — Encounter (HOSPITAL_COMMUNITY): Payer: Self-pay

## 2022-11-30 ENCOUNTER — Inpatient Hospital Stay (HOSPITAL_COMMUNITY)
Admission: EM | Admit: 2022-11-30 | Discharge: 2022-12-02 | DRG: 641 | Disposition: A | Discharge status: Nursing Home (Includes ICF) | Payer: Medicare Other | Source: Skilled Nursing Facility | Attending: Family Medicine | Admitting: Family Medicine

## 2022-11-30 DIAGNOSIS — E119 Type 2 diabetes mellitus without complications: Secondary | ICD-10-CM

## 2022-11-30 DIAGNOSIS — E1142 Type 2 diabetes mellitus with diabetic polyneuropathy: Secondary | ICD-10-CM | POA: Diagnosis present

## 2022-11-30 DIAGNOSIS — G4700 Insomnia, unspecified: Secondary | ICD-10-CM | POA: Diagnosis present

## 2022-11-30 DIAGNOSIS — Z888 Allergy status to other drugs, medicaments and biological substances status: Secondary | ICD-10-CM | POA: Diagnosis not present

## 2022-11-30 DIAGNOSIS — Z9049 Acquired absence of other specified parts of digestive tract: Secondary | ICD-10-CM | POA: Diagnosis not present

## 2022-11-30 DIAGNOSIS — D649 Anemia, unspecified: Secondary | ICD-10-CM | POA: Diagnosis present

## 2022-11-30 DIAGNOSIS — E785 Hyperlipidemia, unspecified: Secondary | ICD-10-CM | POA: Diagnosis present

## 2022-11-30 DIAGNOSIS — E0841 Diabetes mellitus due to underlying condition with diabetic mononeuropathy: Secondary | ICD-10-CM

## 2022-11-30 DIAGNOSIS — J069 Acute upper respiratory infection, unspecified: Secondary | ICD-10-CM | POA: Diagnosis not present

## 2022-11-30 DIAGNOSIS — I10 Essential (primary) hypertension: Secondary | ICD-10-CM | POA: Diagnosis not present

## 2022-11-30 DIAGNOSIS — I160 Hypertensive urgency: Secondary | ICD-10-CM | POA: Diagnosis present

## 2022-11-30 DIAGNOSIS — Z96651 Presence of right artificial knee joint: Secondary | ICD-10-CM | POA: Diagnosis present

## 2022-11-30 DIAGNOSIS — Z8249 Family history of ischemic heart disease and other diseases of the circulatory system: Secondary | ICD-10-CM | POA: Diagnosis not present

## 2022-11-30 DIAGNOSIS — K219 Gastro-esophageal reflux disease without esophagitis: Secondary | ICD-10-CM | POA: Diagnosis present

## 2022-11-30 DIAGNOSIS — Z885 Allergy status to narcotic agent status: Secondary | ICD-10-CM

## 2022-11-30 DIAGNOSIS — Z833 Family history of diabetes mellitus: Secondary | ICD-10-CM

## 2022-11-30 DIAGNOSIS — R531 Weakness: Secondary | ICD-10-CM

## 2022-11-30 DIAGNOSIS — R110 Nausea: Secondary | ICD-10-CM

## 2022-11-30 DIAGNOSIS — Z66 Do not resuscitate: Secondary | ICD-10-CM | POA: Diagnosis present

## 2022-11-30 DIAGNOSIS — R197 Diarrhea, unspecified: Secondary | ICD-10-CM | POA: Diagnosis present

## 2022-11-30 DIAGNOSIS — E878 Other disorders of electrolyte and fluid balance, not elsewhere classified: Secondary | ICD-10-CM | POA: Diagnosis present

## 2022-11-30 DIAGNOSIS — Z79899 Other long term (current) drug therapy: Secondary | ICD-10-CM | POA: Diagnosis not present

## 2022-11-30 DIAGNOSIS — E871 Hypo-osmolality and hyponatremia: Principal | ICD-10-CM | POA: Diagnosis present

## 2022-11-30 DIAGNOSIS — I700 Atherosclerosis of aorta: Secondary | ICD-10-CM | POA: Diagnosis not present

## 2022-11-30 DIAGNOSIS — Z8041 Family history of malignant neoplasm of ovary: Secondary | ICD-10-CM

## 2022-11-30 DIAGNOSIS — E860 Dehydration: Secondary | ICD-10-CM | POA: Diagnosis present

## 2022-11-30 DIAGNOSIS — R42 Dizziness and giddiness: Secondary | ICD-10-CM | POA: Diagnosis not present

## 2022-11-30 DIAGNOSIS — E876 Hypokalemia: Secondary | ICD-10-CM | POA: Diagnosis present

## 2022-11-30 DIAGNOSIS — Z823 Family history of stroke: Secondary | ICD-10-CM | POA: Diagnosis not present

## 2022-11-30 DIAGNOSIS — E1151 Type 2 diabetes mellitus with diabetic peripheral angiopathy without gangrene: Secondary | ICD-10-CM | POA: Diagnosis present

## 2022-11-30 DIAGNOSIS — Z7984 Long term (current) use of oral hypoglycemic drugs: Secondary | ICD-10-CM

## 2022-11-30 DIAGNOSIS — E1140 Type 2 diabetes mellitus with diabetic neuropathy, unspecified: Secondary | ICD-10-CM | POA: Insufficient documentation

## 2022-11-30 DIAGNOSIS — R059 Cough, unspecified: Secondary | ICD-10-CM | POA: Diagnosis not present

## 2022-11-30 LAB — URINALYSIS, ROUTINE W REFLEX MICROSCOPIC
Bacteria, UA: NONE SEEN
Bilirubin Urine: NEGATIVE
Glucose, UA: NEGATIVE mg/dL
Hgb urine dipstick: NEGATIVE
Ketones, ur: NEGATIVE mg/dL
Leukocytes,Ua: NEGATIVE
Nitrite: NEGATIVE
Protein, ur: 30 mg/dL — AB
Specific Gravity, Urine: 1.006 (ref 1.005–1.030)
pH: 7 (ref 5.0–8.0)

## 2022-11-30 LAB — CBC WITH DIFFERENTIAL/PLATELET
Abs Immature Granulocytes: 0.04 10*3/uL (ref 0.00–0.07)
Basophils Absolute: 0 10*3/uL (ref 0.0–0.1)
Basophils Relative: 0 %
Eosinophils Absolute: 0.1 10*3/uL (ref 0.0–0.5)
Eosinophils Relative: 1 %
HCT: 39.6 % (ref 36.0–46.0)
Hemoglobin: 13.4 g/dL (ref 12.0–15.0)
Immature Granulocytes: 1 %
Lymphocytes Relative: 29 %
Lymphs Abs: 2.4 10*3/uL (ref 0.7–4.0)
MCH: 28.6 pg (ref 26.0–34.0)
MCHC: 33.8 g/dL (ref 30.0–36.0)
MCV: 84.6 fL (ref 80.0–100.0)
Monocytes Absolute: 0.5 10*3/uL (ref 0.1–1.0)
Monocytes Relative: 6 %
Neutro Abs: 5.1 10*3/uL (ref 1.7–7.7)
Neutrophils Relative %: 63 %
Platelets: 324 10*3/uL (ref 150–400)
RBC: 4.68 MIL/uL (ref 3.87–5.11)
RDW: 13.2 % (ref 11.5–15.5)
WBC: 8.1 10*3/uL (ref 4.0–10.5)
nRBC: 0 % (ref 0.0–0.2)

## 2022-11-30 LAB — COMPREHENSIVE METABOLIC PANEL
ALT: 16 U/L (ref 0–44)
AST: 18 U/L (ref 15–41)
Albumin: 4.3 g/dL (ref 3.5–5.0)
Alkaline Phosphatase: 106 U/L (ref 38–126)
Anion gap: 13 (ref 5–15)
BUN: 15 mg/dL (ref 8–23)
CO2: 25 mmol/L (ref 22–32)
Calcium: 9.4 mg/dL (ref 8.9–10.3)
Chloride: 87 mmol/L — ABNORMAL LOW (ref 98–111)
Creatinine, Ser: 0.66 mg/dL (ref 0.44–1.00)
GFR, Estimated: >60 mL/min (ref >60)
Glucose, Bld: 124 mg/dL — ABNORMAL HIGH (ref 70–99)
Potassium: 4 mmol/L (ref 3.5–5.1)
Sodium: 125 mmol/L — ABNORMAL LOW (ref 135–145)
Total Bilirubin: 1 mg/dL (ref 0.3–1.2)
Total Protein: 7.9 g/dL (ref 6.5–8.1)

## 2022-11-30 LAB — BASIC METABOLIC PANEL
Anion gap: 11 (ref 5–15)
Anion gap: 10 (ref 5–15)
BUN: 13 mg/dL (ref 8–23)
BUN: 12 mg/dL (ref 8–23)
CO2: 25 mmol/L (ref 22–32)
CO2: 25 mmol/L (ref 22–32)
Calcium: 8.7 mg/dL — ABNORMAL LOW (ref 8.9–10.3)
Calcium: 8.7 mg/dL — ABNORMAL LOW (ref 8.9–10.3)
Chloride: 92 mmol/L — ABNORMAL LOW (ref 98–111)
Chloride: 93 mmol/L — ABNORMAL LOW (ref 98–111)
Creatinine, Ser: 0.69 mg/dL (ref 0.44–1.00)
Creatinine, Ser: 0.85 mg/dL (ref 0.44–1.00)
GFR, Estimated: >60 mL/min (ref >60)
GFR, Estimated: >60 mL/min (ref >60)
Glucose, Bld: 137 mg/dL — ABNORMAL HIGH (ref 70–99)
Glucose, Bld: 174 mg/dL — ABNORMAL HIGH (ref 70–99)
Potassium: 4 mmol/L (ref 3.5–5.1)
Potassium: 3.6 mmol/L (ref 3.5–5.1)
Sodium: 128 mmol/L — ABNORMAL LOW (ref 135–145)
Sodium: 128 mmol/L — ABNORMAL LOW (ref 135–145)

## 2022-11-30 LAB — MAGNESIUM: Magnesium: 1.5 mg/dL — ABNORMAL LOW (ref 1.7–2.4)

## 2022-11-30 LAB — GLUCOSE, CAPILLARY: Glucose-Capillary: 192 mg/dL — ABNORMAL HIGH (ref 70–99)

## 2022-11-30 LAB — SODIUM, URINE, RANDOM: Sodium, Ur: 84 mmol/L

## 2022-11-30 MED ORDER — VITAMIN B-12 1000 MCG PO TABS
1000.0000 ug | ORAL_TABLET | Freq: Every day | ORAL | Status: DC
  Administered 2022-11-30 – 2022-12-01 (×2): 1000 ug via ORAL
  Filled 2022-11-30 (×2): qty 1

## 2022-11-30 MED ORDER — MAGNESIUM SULFATE IN D5W 1-5 GM/100ML-% IV SOLN
1.0000 g | Freq: Once | INTRAVENOUS | Status: AC
  Administered 2022-11-30: 1 g via INTRAVENOUS
  Filled 2022-11-30: qty 100

## 2022-11-30 MED ORDER — IPRATROPIUM-ALBUTEROL 0.5-2.5 (3) MG/3ML IN SOLN: 3.0000 mL | Freq: Four times a day (QID) | RESPIRATORY_TRACT | Status: DC | PRN

## 2022-11-30 MED ORDER — SODIUM CHLORIDE 0.9 % IV BOLUS
500.0000 mL | Freq: Once | INTRAVENOUS | Status: AC
  Administered 2022-11-30: 500 mL via INTRAVENOUS

## 2022-11-30 MED ORDER — IPRATROPIUM-ALBUTEROL 20-100 MCG/ACT IN AERS: 1.0000 | INHALATION_SPRAY | Freq: Four times a day (QID) | RESPIRATORY_TRACT | Status: DC | PRN

## 2022-11-30 MED ORDER — DM-GUAIFENESIN ER 30-600 MG PO TB12
1.0000 | ORAL_TABLET | Freq: Two times a day (BID) | ORAL | Status: DC
  Administered 2022-11-30 – 2022-12-02 (×4): 1 via ORAL
  Filled 2022-11-30 (×4): qty 1

## 2022-11-30 MED ORDER — ACETAMINOPHEN 325 MG PO TABS
650.0000 mg | ORAL_TABLET | Freq: Four times a day (QID) | ORAL | Status: DC | PRN
  Administered 2022-11-30 – 2022-12-01 (×2): 650 mg via ORAL
  Filled 2022-11-30 (×2): qty 2

## 2022-11-30 MED ORDER — PANTOPRAZOLE SODIUM 40 MG PO TBEC
40.0000 mg | DELAYED_RELEASE_TABLET | Freq: Every day | ORAL | Status: DC
  Administered 2022-12-01 – 2022-12-02 (×2): 40 mg via ORAL
  Filled 2022-11-30 (×2): qty 1

## 2022-11-30 MED ORDER — LOSARTAN POTASSIUM 50 MG PO TABS
100.0000 mg | ORAL_TABLET | Freq: Every day | ORAL | Status: DC
  Administered 2022-12-01 – 2022-12-02 (×2): 100 mg via ORAL
  Filled 2022-11-30 (×2): qty 2

## 2022-11-30 MED ORDER — CHOLECALCIFEROL 10 MCG (400 UNIT) PO TABS
400.0000 [IU] | ORAL_TABLET | Freq: Every day | ORAL | Status: DC
  Administered 2022-12-01 – 2022-12-02 (×2): 400 [IU] via ORAL
  Filled 2022-11-30 (×2): qty 1

## 2022-11-30 MED ORDER — ONDANSETRON HCL 4 MG PO TABS: 4.0000 mg | ORAL_TABLET | Freq: Four times a day (QID) | ORAL | Status: DC | PRN

## 2022-11-30 MED ORDER — MAGNESIUM SULFATE 2 GM/50ML IV SOLN
2.0000 g | Freq: Once | INTRAVENOUS | Status: AC
  Administered 2022-12-01: 2 g via INTRAVENOUS
  Filled 2022-11-30: qty 50

## 2022-11-30 MED ORDER — SODIUM CHLORIDE 0.9 % IV BOLUS
1000.0000 mL | Freq: Once | INTRAVENOUS | Status: AC
  Administered 2022-11-30: 1000 mL via INTRAVENOUS

## 2022-11-30 MED ORDER — GABAPENTIN 100 MG PO CAPS
200.0000 mg | ORAL_CAPSULE | Freq: Two times a day (BID) | ORAL | Status: DC
  Administered 2022-11-30 – 2022-12-02 (×4): 200 mg via ORAL
  Filled 2022-11-30 (×4): qty 2

## 2022-11-30 MED ORDER — VITAMIN D (CHOLECALCIFEROL) 10 MCG (400 UNIT) PO TABS: 1.0000 | ORAL_TABLET | Freq: Every day | ORAL | Status: DC

## 2022-11-30 MED ORDER — ONDANSETRON HCL 4 MG/2ML IJ SOLN: 4.0000 mg | Freq: Four times a day (QID) | INTRAMUSCULAR | Status: DC | PRN

## 2022-11-30 MED ORDER — AMOXICILLIN 250 MG PO CAPS
500.0000 mg | ORAL_CAPSULE | Freq: Three times a day (TID) | ORAL | Status: DC
  Administered 2022-11-30 – 2022-12-01 (×3): 500 mg via ORAL
  Filled 2022-11-30 (×3): qty 2

## 2022-11-30 MED ORDER — OMEGA-3-ACID ETHYL ESTERS 1 G PO CAPS
1.0000 g | ORAL_CAPSULE | Freq: Every day | ORAL | Status: DC
  Administered 2022-12-01 – 2022-12-02 (×2): 1 g via ORAL
  Filled 2022-11-30 (×2): qty 1

## 2022-11-30 MED ORDER — ENOXAPARIN SODIUM 40 MG/0.4ML IJ SOSY
40.0000 mg | PREFILLED_SYRINGE | INTRAMUSCULAR | Status: DC
  Administered 2022-11-30 – 2022-12-01 (×2): 40 mg via SUBCUTANEOUS
  Filled 2022-11-30 (×2): qty 0.4

## 2022-11-30 MED ORDER — MELATONIN 3 MG PO TABS
3.0000 mg | ORAL_TABLET | Freq: Every day | ORAL | Status: DC
  Administered 2022-11-30 – 2022-12-01 (×2): 3 mg via ORAL
  Filled 2022-11-30 (×2): qty 1

## 2022-11-30 MED ORDER — MELATONIN 3 MG SL SUBL: 1.0000 | SUBLINGUAL_TABLET | Freq: Every day | SUBLINGUAL | Status: DC

## 2022-11-30 MED ORDER — SODIUM CHLORIDE 0.9 % IV SOLN: INTRAVENOUS | Status: AC

## 2022-11-30 MED ORDER — INSULIN ASPART 100 UNIT/ML IJ SOLN
0.0000 [IU] | Freq: Three times a day (TID) | INTRAMUSCULAR | Status: DC
  Administered 2022-12-01: 1 [IU] via SUBCUTANEOUS

## 2022-11-30 MED ORDER — AMLODIPINE BESYLATE 5 MG PO TABS
10.0000 mg | ORAL_TABLET | Freq: Every day | ORAL | Status: DC
  Administered 2022-12-01 – 2022-12-02 (×2): 10 mg via ORAL
  Filled 2022-11-30 (×2): qty 2

## 2022-11-30 MED ORDER — ACETAMINOPHEN 650 MG RE SUPP: 650.0000 mg | Freq: Four times a day (QID) | RECTAL | Status: DC | PRN

## 2022-11-30 NOTE — ED Notes (Signed)
Facility updated as to patient's status.  Call report to Angie at [phone].

## 2022-11-30 NOTE — H&P (Signed)
History and Physical    Patient: [name]:[id number] DOB: 05/03/1932 DOA: 11/30/2022 DOS: the patient was seen and examined on 11/30/2022 PCP: Babs Sciara, MD  Patient coming from: [place]  Chief Complaint:  Chief Complaint  Patient presents with   Weakness   HPI: [name] is a 89 y.o. female with medical history significant of essential hypertension, T2DM, GERD who presents to the emergency department from [facility] via EMS due to generalized weakness.  Patient complaining of 2-week onset of diarrhea with 2-3 loose bowel movements daily except for today that she only had 1 loose movement.  She was started on amoxicillin about 4 days ago due to upper respiratory tract infection with productive cough of yellow sputum which started about 10 days ago.  She woke up this morning complaining of weakness and fatigue with decreased exercise tolerance (usually walks 1500 steps daily, but was unable to do that today), this was associated with nausea without vomiting.  She complained of decreased appetite due to being able to only eat a small portion of her food today.  She denies chest pain, shortness of breath, fever, chills, abdominal pain.  ED Course:  In the emergency department, BP was 174/61, other vital signs are within normal range.  Workup in the ED showed normal CBC, BMP showed sodium 125, potassium 4.0, chloride 87, bicarb 25, blood glucose 124, BUN 15, creatinine 0.66.  Urinalysis was normal, magnesium 1.5. Chest x-ray showed right middle lobe opacity is not significantly changed in the interval raising the possibility of chronic atelectasis or scarring given chronicity. No acute interval changes. IV hydration was provided, magnesium 1 g was given.  Repeat BMP showed sodium of 128, chloride 92.  Hospitalist was asked to admit patient for further evaluation and management.    Review of Systems: Review of systems as noted in the HPI. All other systems reviewed and are  negative.   Past Medical History:  Diagnosis Date   Cognitive dysfunction 03/04/2019   Patient scores 21 out of 30 on [place] cognitive assessment September 2020   Diabetes mellitus without complication [facility])    Diabetic peripheral neuropathy associated with type 2 diabetes mellitus (HCC) 02/25/2022   Diverticulitis    Frailty 03/04/2019   H/O bilateral breast reduction surgery    Hyperlipidemia    a. intolerant to statins.    Hypertension    Past Surgical History:  Procedure Laterality Date   ABDOMINAL HYSTERECTOMY  2003   APPENDECTOMY     age 57   BREAST REDUCTION SURGERY     age 73   COLON SURGERY Left 2011   Hemicolectomy due to diverticulitis   EYE SURGERY  03/30/2009   cataract   KNEE SURGERY Right 2003   knee cap   LAPAROSCOPIC INCISIONAL / UMBILICAL / VENTRAL HERNIA REPAIR  02/23/2007   REDUCTION MAMMAPLASTY Bilateral 2001   REFRACTIVE SURGERY     TOTAL KNEE ARTHROPLASTY Right 04/10/2022   Procedure: TOTAL KNEE ARTHROPLASTY;  Surgeon: Eugenia Mcalpine, MD;  Location: [facility] ORS;  Service: Orthopedics;  Laterality: Right;  adductor canal    Social History:  reports that she has never smoked. She has never used smokeless tobacco. She reports that she does not drink alcohol and does not use drugs.   Allergies  Allergen Reactions   Hydrocortisone Itching   Lisinopril     Other reaction(s): Lethargy (intolerance)   Phenergan [Promethazine Hcl] Other (See Comments)    hallucinations   Statins Other (See Comments)  Severe myalgias   Trazodone And Nefazodone Cough   Prednisone Rash    Family History  Problem Relation Age of Onset   Cancer Mother        ovaian   Hypertension Father        kidney   Stroke Father    Cancer Father    Hypertension Sister    Diabetes Brother      Prior to Admission medications   Medication Sig Start Date End Date Taking? Authorizing Provider  acetaminophen (TYLENOL) 325 MG tablet TAKE 2 TABLETS(650MG ) BY MOUTH EVERY 6 HOURS AS  NEEDED FOR PAIN/TEMPERATURE. Patient taking differently: Take 650 mg by mouth every 6 (six) hours as needed for moderate pain. 08/20/22  Yes Babs Sciara, MD  amLODipine (NORVASC) 10 MG tablet Take 1 tablet (10 mg total) by mouth daily. 11/19/22  Yes Babs Sciara, MD  amoxicillin (AMOXIL) 500 MG capsule Take 1 capsule (500 mg total) by mouth 3 (three) times daily for 10 days. 11/27/22 12/07/22 Yes Luking, [name], MD  benzonatate (TESSALON) 100 MG capsule TAKE (1) CAPSULE BY MOUTH 3 TIMES DAILY AS NEEDED FOR COUGH. Patient taking differently: Take 100 mg by mouth 3 (three) times daily as needed for cough. 11/21/22  Yes Babs Sciara, MD  beta carotene w/minerals (OCUVITE) tablet Take 1 tablet by mouth at bedtime. Patient taking differently: Take 1 tablet by mouth daily. 07/03/22  Yes Luking, [name], MD  bisacodyl (DULCOLAX) 5 MG EC tablet Take 1 tablet (5 mg total) by mouth daily as needed for moderate constipation. 04/11/22  Yes Haus, Leeanne R, PA  Carboxymethylcellulose Sodium (THERATEARS) 0.25 % SOLN Apply 1 drop to eye in the morning and at bedtime.   Yes [provider]  COMBIVENT RESPIMAT 20-100 MCG/ACT AERS respimat INHALE (1) PUFF INTO THE LUNGS EVERY SIX HOURS AS NEEDED FOR WHEEZING OR SHORTNESS OF BREATH. Patient taking differently: Inhale 1 puff into the lungs every 6 (six) hours as needed for wheezing or shortness of breath. 11/21/22  Yes Luking, [name], MD  cyanocobalamin 1000 MCG tablet Take 1,000 mcg by mouth at bedtime.   Yes [provider]  dicyclomine (BENTYL) 10 MG capsule TAKE 1 CAPSULE BY MOUTH TWICE DAILY AS NEEDED FOR ABDOMINAL CRAMPS AND LOOSE STOOL. 11/21/22  Yes Luking, [name], MD  EASYMAX TEST test strip CHECK BLOOD SUGAR ONCE DAILY.(CALL MD IF BS BELOW 60: OR IF BS ABOVE 400) 10/28/22  Yes Luking, Scott A, MD  famotidine (PEPCID) 20 MG tablet TAKE 1 TABLET BY MOUTH EACH MORNING. 11/19/22  Yes Luking, [name], MD  gabapentin (NEURONTIN) 100 MG capsule 2  po bid Patient taking differently: Take 200 mg by mouth 2 (two) times daily. 11/19/22  Yes Luking, [name], MD  GOODSENSE HEMORRHOIDAL 0.25-14-74.9 % rectal ointment Place 1 Application rectally 3 (three) times daily as needed for irritation. 12/26/21  Yes [provider]  loratadine (CLARITIN) 10 MG tablet Take 10 mg by mouth daily.   Yes [provider]  losartan (COZAAR) 100 MG tablet Take 1 tablet daily 11/19/22  Yes Luking, [name], MD  Melatonin 3 MG SUBL TAKE (1) TABLET BY MOUTH AT BEDTIME. Patient taking differently: Take 1 tablet by mouth at bedtime. 05/30/22  Yes Babs Sciara, MD  metFORMIN (GLUCOPHAGE) 500 MG tablet Take one tablet 500mg  po in the morning and 1/2 tablet 250 mg po at supper 11/19/22  Yes Luking, [name], MD  Multiple Vitamins-Minerals (PRESERVISION AREDS 2) CAPS  Take 1 capsule by mouth in the morning and at bedtime.   Yes [provider]  Omega-3 Fatty Acids (FISH OIL) 1000 MG CAPS One capsule BID 08/07/21  Yes Luking, Scott A, MD  pantoprazole (PROTONIX) 20 MG tablet TAKE (1) TABLET BY MOUTH ONCE DAILY. 11/19/22  Yes Babs Sciara, MD  Vitamin D, Cholecalciferol, 10 MCG (400 UNIT) TABS TAKE 1 TABLET BY MOUTH ONCE DAILY. 05/12/20  Yes Babs Sciara, MD    Physical Exam: BP (!) 155/64 (BP Location: Left Arm)   Pulse 87   Temp 97.7 F (36.5 C) (Oral)   Resp 16   Ht 5\' 8"  (1.727 m)   Wt 76.5 kg   SpO2 99%   BMI 25.65 kg/m   General: 89 y.o. year-old female ill appearing, but in no acute distress.  Alert and oriented x3. HEENT: NCAT, EOMI, dry mucous membrane Neck: Supple, trachea medial Cardiovascular: Regular rate and rhythm with no rubs or gallops.  No thyromegaly or JVD noted.  No lower extremity edema. 2/4 pulses in all 4 extremities. Respiratory: Clear to auscultation with no wheezes or rales. Good inspiratory effort. Abdomen: Soft, nontender nondistended with normal bowel sounds x4 quadrants. Muskuloskeletal: No cyanosis,  clubbing or edema noted bilaterally Neuro: CN II-XII intact, strength 5/5 x 4, sensation, reflexes intact Skin: No ulcerative lesions noted or rashes Psychiatry: Judgement and insight appear normal. Mood is appropriate for condition and setting          Labs on Admission:  Basic Metabolic Panel: Recent Labs  Lab 11/30/22 1416 11/30/22 1656  NA 125* 128*  K 4.0 4.0  CL 87* 92*  CO2 25 25  GLUCOSE 124* 137*  BUN 15 13  CREATININE 0.66 0.69  CALCIUM 9.4 8.7*  MG 1.5*  --    Liver Function Tests: Recent Labs  Lab 11/30/22 1416  AST 18  ALT 16  ALKPHOS 106  BILITOT 1.0  PROT 7.9  ALBUMIN 4.3   No results for input(s): "LIPASE", "AMYLASE" in the last 168 hours. No results for input(s): "AMMONIA" in the last 168 hours. CBC: Recent Labs  Lab 11/30/22 1416  WBC 8.1  NEUTROABS 5.1  HGB 13.4  HCT 39.6  MCV 84.6  PLT 324   Cardiac Enzymes: No results for input(s): "CKTOTAL", "CKMB", "CKMBINDEX", "TROPONINI" in the last 168 hours.  BNP (last 3 results) No results for input(s): "BNP" in the last 8760 hours.  ProBNP (last 3 results) No results for input(s): "PROBNP" in the last 8760 hours.  CBG: No results for input(s): "GLUCAP" in the last 168 hours.  Radiological Exams on Admission: DG Chest 2 View  Result Date: 11/30/2022 CLINICAL DATA:  Cough for 10 days EXAM: CHEST - 2 VIEW COMPARISON:  February 22, 2022 FINDINGS: The cardiomediastinal silhouette is stable. No pneumothorax. Mild haziness in the bases is likely overlapping soft tissues. No effusions identified based on the lateral view. Right middle lobe opacity, not significantly changed in the interval. IMPRESSION: Right middle lobe opacity is not significantly changed in the interval raising the possibility of chronic atelectasis or scarring given chronicity. No acute interval changes. Electronically Signed   By: Gerome Sam III M.D.   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)   Essential hypertension   Insomnia   Hyponatremia   GERD (gastroesophageal reflux disease)   Acute diarrhea   Hypomagnesemia   Hypertensive urgency   Upper respiratory tract infection   Diabetic neuropathy (HCC)  Generalized weakness possibly secondary to multifactorial including dehydration, electrolyte abnormalities Continue IV hydration Replenish electrolytes Continue fall precaution Continue PT eval and treat  Acute diarrhea-improved Patient complained of 2-week onset of 2-3 episodes of loose bowel movement daily, this improved to 1 loose bowel movement today (thinks it was due to not eating much today due to decreased appetite). Consider C. Diff and GI stool panel if patient continues to have diarrhea  Hyponatremia possibly due to dehydration Na 125 > 128; continue IV hydration Continue to monitor sodium with serial BMPs Urine osmolality, serum osmolality and urine sodium will be checked  Hypomagnesemia possibly due to GI loss Mg level is 1.5 This will be replenished Please continue to monitor Mg level and correct accordingly  Nausea Continue Zofran as needed  Upper respiratory tract infection Continue amoxicillin, Mucinex, Combivent Respimat  Hypertensive urgency-resolved Essential hypertension Continue amlodipine, losartan  GERD Continue Protonix  Type 2 diabetes mellitus Hemoglobin  A1c on 11/28/2022 was 6.8 Continue ISS and hypoglycemic protocol  Diabetic neuropathy Continue Neurontin  Insomnia Continue melatonin  Other home meds: Vitamin B12, Lovaza, vitamin D  DVT  prophylaxis: Lovenox  Advance Care Planning: DNR (this was confirmed with the patient)  Consults: None  Family Communication: None at bedside  Severity of Illness: The appropriate patient status for this patient is OBSERVATION. Observation status is judged to be reasonable and necessary in order to provide the required intensity of service to ensure the patient's safety. The patient's presenting symptoms, physical exam findings, and initial radiographic and laboratory data in the context of their medical condition is felt to place them at decreased risk for further clinical deterioration. Furthermore, it is anticipated that the patient will be medically stable for discharge from the hospital within 2 midnights of admission.   Author: Frankey Shown, DO 11/30/2022 9:17 PM  For on call review [email or web address].

## 2022-11-30 NOTE — ED Provider Notes (Signed)
[place] EMERGENCY DEPARTMENT AT [facility] Provider Note   CSN: [id number] Arrival date & time: 11/30/22  1353     History  Chief Complaint  Patient presents with   Weakness    [name] is a 89 y.o. female.  With history of diabetes, diabetic neuropathy, hypertension, hyperlipidemia who presents to the ED for evaluation of cough and fatigue.  She developed a cough approximately 10 days ago.  Was started on amoxicillin 4 days ago.  States her symptoms have improved are still present.  Today she woke up and felt extremely fatigued.  Typically walks 1500 steps a day but was unable to do this today.  Developed some nausea today as well.  No emesis.  States she was only able to eat a small portion of food today.  She denies shortness of breath, chest pain, abdominal pain, fevers, chills, headaches, numbness.  Her cough is productive of yellow sputum.   Weakness Associated symptoms: cough        Home Medications Prior to Admission medications   Medication Sig Start Date End Date Taking? Authorizing Provider  acetaminophen (TYLENOL) 325 MG tablet TAKE 2 TABLETS(650MG ) BY MOUTH EVERY 6 HOURS AS NEEDED FOR PAIN/TEMPERATURE. Patient taking differently: Take 650 mg by mouth every 6 (six) hours as needed for moderate pain. 08/20/22  Yes Babs Sciara, MD  amLODipine (NORVASC) 10 MG tablet Take 1 tablet (10 mg total) by mouth daily. 11/19/22  Yes Babs Sciara, MD  amoxicillin (AMOXIL) 500 MG capsule Take 1 capsule (500 mg total) by mouth 3 (three) times daily for 10 days. 11/27/22 12/07/22 Yes Luking, [name], MD  benzonatate (TESSALON) 100 MG capsule TAKE (1) CAPSULE BY MOUTH 3 TIMES DAILY AS NEEDED FOR COUGH. Patient taking differently: Take 100 mg by mouth 3 (three) times daily as needed for cough. 11/21/22  Yes Babs Sciara, MD  beta carotene w/minerals (OCUVITE) tablet Take 1 tablet by mouth at bedtime. Patient taking differently: Take 1 tablet by mouth daily. 07/03/22   Yes Luking, [name], MD  bisacodyl (DULCOLAX) 5 MG EC tablet Take 1 tablet (5 mg total) by mouth daily as needed for moderate constipation. 04/11/22  Yes Haus, Leeanne R, PA  Carboxymethylcellulose Sodium (THERATEARS) 0.25 % SOLN Apply 1 drop to eye in the morning and at bedtime.   Yes [provider]  COMBIVENT RESPIMAT 20-100 MCG/ACT AERS respimat INHALE (1) PUFF INTO THE LUNGS EVERY SIX HOURS AS NEEDED FOR WHEEZING OR SHORTNESS OF BREATH. Patient taking differently: Inhale 1 puff into the lungs every 6 (six) hours as needed for wheezing or shortness of breath. 11/21/22  Yes Luking, [name], MD  cyanocobalamin 1000 MCG tablet Take 1,000 mcg by mouth at bedtime.   Yes [provider]  dicyclomine (BENTYL) 10 MG capsule TAKE 1 CAPSULE BY MOUTH TWICE DAILY AS NEEDED FOR ABDOMINAL CRAMPS AND LOOSE STOOL. 11/21/22  Yes Luking, [name], MD  EASYMAX TEST test strip CHECK BLOOD SUGAR ONCE DAILY.(CALL MD IF BS BELOW 60: OR IF BS ABOVE 400) 10/28/22  Yes Luking, Scott A, MD  famotidine (PEPCID) 20 MG tablet TAKE 1 TABLET BY MOUTH EACH MORNING. 11/19/22  Yes Luking, [name], MD  gabapentin (NEURONTIN) 100 MG capsule 2 po bid Patient taking differently: Take 200 mg by mouth 2 (two) times daily. 11/19/22  Yes Luking, [name], MD  GOODSENSE HEMORRHOIDAL 0.25-14-74.9 % rectal ointment Place 1 Application rectally 3 (three) times daily as needed for irritation. 12/26/21  Yes [provider]  loratadine (CLARITIN) 10 MG tablet Take 10 mg by mouth daily.   Yes [provider]  losartan (COZAAR) 100 MG tablet Take 1 tablet daily 11/19/22  Yes Luking, [name], MD  Melatonin 3 MG SUBL TAKE (1) TABLET BY MOUTH AT BEDTIME. Patient taking differently: Take 1 tablet by mouth at bedtime. 05/30/22  Yes Babs Sciara, MD  metFORMIN (GLUCOPHAGE) 500 MG tablet Take one tablet 500mg  po in the morning and 1/2 tablet 250 mg po at supper 11/19/22  Yes Luking, [name], MD  Multiple Vitamins-Minerals  (PRESERVISION AREDS 2) CAPS Take 1 capsule by mouth in the morning and at bedtime.   Yes [provider]  Omega-3 Fatty Acids (FISH OIL) 1000 MG CAPS One capsule BID 08/07/21  Yes Luking, Scott A, MD  pantoprazole (PROTONIX) 20 MG tablet TAKE (1) TABLET BY MOUTH ONCE DAILY. 11/19/22  Yes Babs Sciara, MD  Vitamin D, Cholecalciferol, 10 MCG (400 UNIT) TABS TAKE 1 TABLET BY MOUTH ONCE DAILY. 05/12/20  Yes Babs Sciara, MD      Allergies    Hydrocortisone, Lisinopril, Phenergan [promethazine hcl], Statins, Trazodone and nefazodone, and Prednisone    Review of Systems   Review of Systems  Respiratory:  Positive for cough.   Neurological:  Positive for weakness.  All other systems reviewed and are negative.   Physical Exam Updated Vital Signs BP (!) 182/63   Pulse 70   Temp 97.9 F (36.6 C)   Resp 16   Ht 5\' 8"  (1.727 m)   Wt 76.7 kg   SpO2 94%   BMI 25.70 kg/m  Physical Exam Vitals and nursing note reviewed.  Constitutional:      General: She is not in acute distress.    Appearance: Normal appearance. She is well-developed. She is not ill-appearing or diaphoretic.     Comments: Appears tired, resting comfortably in bed  HENT:     Head: Normocephalic and atraumatic.  Eyes:     Conjunctiva/sclera: Conjunctivae normal.  Cardiovascular:     Rate and Rhythm: Normal rate and regular rhythm.     Heart sounds: No murmur heard. Pulmonary:     Effort: Pulmonary effort is normal. No respiratory distress.     Breath sounds: Normal breath sounds. No wheezing or rhonchi.  Abdominal:     Palpations: Abdomen is soft.     Tenderness: There is no abdominal tenderness. There is no guarding.  Musculoskeletal:        General: No swelling.     Cervical back: Neck supple.     Right lower leg: No edema.     Left lower leg: No edema.  Skin:    General: Skin is warm and dry.     Capillary Refill: Capillary refill takes less than 2 seconds.  Neurological:     General: No focal  deficit present.     Mental Status: She is alert and oriented to person, place, and time.  Psychiatric:        Mood and Affect: Mood normal.     ED Results / Procedures / Treatments   Labs (all labs ordered are listed, but only abnormal results are displayed) Labs Reviewed  URINALYSIS, ROUTINE W REFLEX MICROSCOPIC - Abnormal; Notable for the following components:      Result Value   Color, Urine STRAW (*)    Protein, ur 30 (*)    All other components within normal limits  COMPREHENSIVE METABOLIC PANEL - Abnormal;  Notable for the following components:   Sodium 125 (*)    Chloride 87 (*)    Glucose, Bld 124 (*)    All other components within normal limits  MAGNESIUM - Abnormal; Notable for the following components:   Magnesium 1.5 (*)    All other components within normal limits  BASIC METABOLIC PANEL - Abnormal; Notable for the following components:   Sodium 128 (*)    Chloride 92 (*)    Glucose, Bld 137 (*)    Calcium 8.7 (*)    All other components within normal limits  CBC WITH DIFFERENTIAL/PLATELET    EKG None  Radiology DG Chest 2 View  Result Date: 11/30/2022 CLINICAL DATA:  Cough for 10 days EXAM: CHEST - 2 VIEW COMPARISON:  February 22, 2022 FINDINGS: The cardiomediastinal silhouette is stable. No pneumothorax. Mild haziness in the bases is likely overlapping soft tissues. No effusions identified based on the lateral view. Right middle lobe opacity, not significantly changed in the interval. IMPRESSION: Right middle lobe opacity is not significantly changed in the interval raising the possibility of chronic atelectasis or scarring given chronicity. No acute interval changes. Electronically Signed   By: Gerome Sam III M.D.   On: 11/30/2022 14:50    Procedures Procedures    Medications Ordered in ED Medications  magnesium sulfate IVPB 2 g 50 mL (has no administration in time range)  sodium chloride 0.9 % bolus 1,000 mL (0 mLs Intravenous Stopped 11/30/22 1630)   magnesium sulfate IVPB 1 g 100 mL (0 g Intravenous Stopped 11/30/22 1729)  sodium chloride 0.9 % bolus 500 mL (0 mLs Intravenous Stopped 11/30/22 1749)    ED Course/ Medical Decision Making/ A&P Clinical Course as of 11/30/22 2002  Sat Nov 30, 2022  1635 Spoke with hospitalist Dr. Adrian Blackwater regarding patient disposition.  He recommends repeating metabolic panel to reassess hyponatremia.  If improved, will be safe for discharge back to facility with strong return precautions.  If no improvement, will need admission. [AS]  1935 Spoke with Dr. Thomes Dinning who will admit [AS]    Clinical Course User Index [AS] Cal Gindlesperger, Edsel Petrin, PA-C                             Medical Decision Making Amount and/or Complexity of Data Reviewed Labs: ordered. Radiology: ordered.  Risk Prescription drug management. Decision regarding hospitalization.  This patient presents to the ED for concern of cough, fatigue, this involves an extensive number of treatment options, and is a complaint that carries with it a high risk of complications and morbidity. The differential diagnosis of weakness includes but is not limited to neurologic causes (GBS, myasthenia gravis, CVA, MS, ALS, transverse myelitis, spinal cord injury, CVA, botulism, ) and other causes: ACS, Arrhythmia, syncope, orthostatic hypotension, sepsis, hypoglycemia, electrolyte disturbance, hypothyroidism, respiratory failure, symptomatic anemia, dehydration, heat injury, polypharmacy, malignancy.   Co morbidities that complicate the patient evaluation  diabetes, diabetic neuropathy, hypertension, hyperlipidemia  My initial workup includes labs, imaging, EKG, IV fluids  Additional history obtained from: Nursing notes from this visit. Previous records within EMR system PCP visit on 11/27/2022.  Was diagnosed with bacterial rhinosinusitis and prescribed amoxicillin EMS provides a portion of the history  I ordered, reviewed and interpreted labs which  include: CBC, CMP, magnesium, urinalysis.  Hyponatremia of 125.  Hypochloremia of 87.  Hypomagnesemia of 1.5.  I ordered imaging studies including chest x-ray I independently visualized and interpreted imaging which  showed likely chronic changes I agree with the radiologist interpretation  Cardiac Monitoring:  The patient was maintained on a cardiac monitor.  I personally viewed and interpreted the cardiac monitored which showed an underlying rhythm of: NSR  Consultations Obtained:  I requested consultation with the hospitalist,  and discussed lab and imaging findings as well as pertinent plan - they recommend: Admission  Afebrile, hemodynamically stable.  89 year old female presenting to the ED for evaluation of generalized fatigue and weakness.  She has been dealing with a URI for the past 10 days.  Was started on amoxicillin 4 days ago.  She states she has had poor p.o. intake over the past 10 days secondary to her URI.  This may be the cause of her electrolyte abnormality.  Patient was given 1.5 L of normal saline and a metabolic panel was rechecked.  Her sodium only went up to 128.  Patient reports no improvement in her symptoms.  She will be admitted for hyponatremia and hypomagnesemia.  Stable at the time of admission.  Patient's case discussed with Dr. Deretha Emory who agrees with plan to admit.   Note: Portions of this report may have been transcribed using voice recognition software. Every effort was made to ensure accuracy; however, inadvertent computerized transcription errors may still be present.        Final Clinical Impression(s) / ED Diagnoses Final diagnoses:  Hyponatremia  Generalized weakness    Rx / DC Orders ED Discharge Orders     None         Mora Bellman 11/30/22 Baird Cancer, MD 11/30/22 [id number]

## 2022-11-30 NOTE — ED Notes (Addendum)
Facility updated as to patient's status. 

## 2022-11-30 NOTE — ED Notes (Signed)
Pt given sandwich, chips, grapes and soda. No further needs expressed at this time.

## 2022-11-30 NOTE — Progress Notes (Signed)
Patient Name: [name], female   DOB: [birth year], 89 y.o.  MRN: [id number]  Called by EDP -89 year old patient with URI symptoms on amoxicillin for sinus infection.  Patient presents with fatigue and found to be mildly hyponatremic, hypochloremic, and hypomagnesemic.  The patient had been given IV fluids: 1500 mL of normal saline with 1 g mag sulfate.  The question posed by EDP is whether this is a patient who needed observation, especially because the patient's hyponatremia is mild and the patient is asymptomatic.  After discussing options with the EDP, we settled on rechecking the patient's BMP and if improving, patient can return to facility with encouragement to continue oral hydration.  If repeat BMP shows minimally changed sodium level, will admit the patient for treatment of hyponatremia.  Levie Heritage, DO

## 2022-11-30 NOTE — ED Triage Notes (Signed)
Pt BIB [facility] from [facility]. C/o weakness, recently diagnosed with URI, given abx, has been taking as prescribed with little improvement.   Been feeling weak and nausea since this morning. Given 25 mg meclizine PTA.

## 2022-12-01 DIAGNOSIS — E860 Dehydration: Secondary | ICD-10-CM | POA: Diagnosis present

## 2022-12-01 DIAGNOSIS — J069 Acute upper respiratory infection, unspecified: Secondary | ICD-10-CM | POA: Diagnosis present

## 2022-12-01 DIAGNOSIS — Z79899 Other long term (current) drug therapy: Secondary | ICD-10-CM | POA: Diagnosis not present

## 2022-12-01 DIAGNOSIS — E1142 Type 2 diabetes mellitus with diabetic polyneuropathy: Secondary | ICD-10-CM | POA: Diagnosis present

## 2022-12-01 DIAGNOSIS — Z885 Allergy status to narcotic agent status: Secondary | ICD-10-CM | POA: Diagnosis not present

## 2022-12-01 DIAGNOSIS — Z8041 Family history of malignant neoplasm of ovary: Secondary | ICD-10-CM | POA: Diagnosis not present

## 2022-12-01 DIAGNOSIS — E876 Hypokalemia: Secondary | ICD-10-CM | POA: Diagnosis present

## 2022-12-01 DIAGNOSIS — Z96651 Presence of right artificial knee joint: Secondary | ICD-10-CM | POA: Diagnosis present

## 2022-12-01 DIAGNOSIS — K219 Gastro-esophageal reflux disease without esophagitis: Secondary | ICD-10-CM | POA: Diagnosis present

## 2022-12-01 DIAGNOSIS — Z9049 Acquired absence of other specified parts of digestive tract: Secondary | ICD-10-CM | POA: Diagnosis not present

## 2022-12-01 DIAGNOSIS — R197 Diarrhea, unspecified: Secondary | ICD-10-CM | POA: Diagnosis present

## 2022-12-01 DIAGNOSIS — R531 Weakness: Secondary | ICD-10-CM | POA: Diagnosis not present

## 2022-12-01 DIAGNOSIS — Z823 Family history of stroke: Secondary | ICD-10-CM | POA: Diagnosis not present

## 2022-12-01 DIAGNOSIS — I700 Atherosclerosis of aorta: Secondary | ICD-10-CM | POA: Diagnosis present

## 2022-12-01 DIAGNOSIS — D649 Anemia, unspecified: Secondary | ICD-10-CM | POA: Diagnosis present

## 2022-12-01 DIAGNOSIS — E871 Hypo-osmolality and hyponatremia: Secondary | ICD-10-CM | POA: Diagnosis present

## 2022-12-01 DIAGNOSIS — G4700 Insomnia, unspecified: Secondary | ICD-10-CM | POA: Diagnosis present

## 2022-12-01 DIAGNOSIS — I160 Hypertensive urgency: Secondary | ICD-10-CM | POA: Diagnosis present

## 2022-12-01 DIAGNOSIS — Z888 Allergy status to other drugs, medicaments and biological substances status: Secondary | ICD-10-CM | POA: Diagnosis not present

## 2022-12-01 DIAGNOSIS — Z8249 Family history of ischemic heart disease and other diseases of the circulatory system: Secondary | ICD-10-CM | POA: Diagnosis not present

## 2022-12-01 DIAGNOSIS — E785 Hyperlipidemia, unspecified: Secondary | ICD-10-CM | POA: Diagnosis present

## 2022-12-01 DIAGNOSIS — I10 Essential (primary) hypertension: Secondary | ICD-10-CM | POA: Diagnosis present

## 2022-12-01 DIAGNOSIS — Z66 Do not resuscitate: Secondary | ICD-10-CM | POA: Diagnosis present

## 2022-12-01 DIAGNOSIS — Z833 Family history of diabetes mellitus: Secondary | ICD-10-CM | POA: Diagnosis not present

## 2022-12-01 LAB — COMPREHENSIVE METABOLIC PANEL
ALT: 12 U/L (ref 0–44)
AST: 13 U/L — ABNORMAL LOW (ref 15–41)
Albumin: 3.1 g/dL — ABNORMAL LOW (ref 3.5–5.0)
Alkaline Phosphatase: 72 U/L (ref 38–126)
Anion gap: 8 (ref 5–15)
BUN: 14 mg/dL (ref 8–23)
CO2: 26 mmol/L (ref 22–32)
Calcium: 8.3 mg/dL — ABNORMAL LOW (ref 8.9–10.3)
Chloride: 95 mmol/L — ABNORMAL LOW (ref 98–111)
Creatinine, Ser: 0.75 mg/dL (ref 0.44–1.00)
GFR, Estimated: >60 mL/min (ref >60)
Glucose, Bld: 116 mg/dL — ABNORMAL HIGH (ref 70–99)
Potassium: 3.4 mmol/L — ABNORMAL LOW (ref 3.5–5.1)
Sodium: 129 mmol/L — ABNORMAL LOW (ref 135–145)
Total Bilirubin: 0.9 mg/dL (ref 0.3–1.2)
Total Protein: 5.7 g/dL — ABNORMAL LOW (ref 6.5–8.1)

## 2022-12-01 LAB — CBC
HCT: 33.4 % — ABNORMAL LOW (ref 36.0–46.0)
Hemoglobin: 10.9 g/dL — ABNORMAL LOW (ref 12.0–15.0)
MCH: 28.5 pg (ref 26.0–34.0)
MCHC: 32.6 g/dL (ref 30.0–36.0)
MCV: 87.2 fL (ref 80.0–100.0)
Platelets: 264 10*3/uL (ref 150–400)
RBC: 3.83 MIL/uL — ABNORMAL LOW (ref 3.87–5.11)
RDW: 13.3 % (ref 11.5–15.5)
WBC: 6.5 10*3/uL (ref 4.0–10.5)
nRBC: 0 % (ref 0.0–0.2)

## 2022-12-01 LAB — GLUCOSE, CAPILLARY
Glucose-Capillary: 108 mg/dL — ABNORMAL HIGH (ref 70–99)
Glucose-Capillary: 109 mg/dL — ABNORMAL HIGH (ref 70–99)
Glucose-Capillary: 110 mg/dL — ABNORMAL HIGH (ref 70–99)
Glucose-Capillary: 114 mg/dL — ABNORMAL HIGH (ref 70–99)
Glucose-Capillary: 125 mg/dL — ABNORMAL HIGH (ref 70–99)

## 2022-12-01 LAB — PHOSPHORUS: Phosphorus: 3.5 mg/dL (ref 2.5–4.6)

## 2022-12-01 LAB — OSMOLALITY, URINE: Osmolality, Ur: 281 mOsm/kg — ABNORMAL LOW (ref 300–900)

## 2022-12-01 LAB — MAGNESIUM: Magnesium: 2.1 mg/dL (ref 1.7–2.4)

## 2022-12-01 LAB — OSMOLALITY: Osmolality: 282 mOsm/kg (ref 275–295)

## 2022-12-01 MED ORDER — SODIUM CHLORIDE 0.9 % IV SOLN: INTRAVENOUS | Status: AC

## 2022-12-01 MED ORDER — POTASSIUM CHLORIDE CRYS ER 20 MEQ PO TBCR
40.0000 meq | EXTENDED_RELEASE_TABLET | Freq: Once | ORAL | Status: AC
  Administered 2022-12-01: 40 meq via ORAL
  Filled 2022-12-01: qty 2

## 2022-12-01 NOTE — Evaluation (Signed)
Physical Therapy Evaluation Patient Details Name: [name] MRN: [id number] DOB: February 11, 1932 Today's Date: 12/01/2022  History of Present Illness  [name] is a 89 y.o. female with medical history significant of essential hypertension, T2DM, GERD who presents to the emergency department from [facility] via EMS due to generalized weakness.  Patient complaining of 2-week onset of diarrhea with 2-3 loose bowel movements daily except for today that she only had 1 loose movement.  She was started on amoxicillin about 4 days ago due to upper respiratory tract infection with productive cough of yellow sputum which started about 10 days ago.  She woke up this morning complaining of weakness and fatigue with decreased exercise tolerance (usually walks 1500 steps daily, but was unable to do that today), this was associated with nausea without vomiting.  She complained of decreased appetite due to being able to only eat a small portion of her food today.  She denies chest pain, shortness of breath, fever, chills, abdominal pain.   Clinical Impression  Patient demonstrates good return for sitting up at bedside with [facility] partially raised, transferring to/from chair and commode in bathroom and able to ambulate in room/hallway without loss of balance using RW, but has to lean on nearby objects for support when taking steps without AD.  Patient limited for gait training mostly due to c/o of fatigue and tolerated sitting up in chair after therapy - nursing staff notified. Patient will benefit from continued skilled physical therapy in hospital and recommended venue below to increase strength, balance, endurance for safe ADLs and gait.        Recommendations for follow up therapy are one component of a multi-disciplinary discharge planning process, led by the attending physician.  Recommendations may be updated based on patient status, additional functional criteria and insurance authorization.  Follow Up  Recommendations       Assistance Recommended at Discharge Set up Supervision/Assistance  Patient can return home with the following  A little help with walking and/or transfers;A little help with bathing/dressing/bathroom;Help with stairs or ramp for entrance;Assistance with cooking/housework    Equipment Recommendations None recommended by PT  Recommendations for Other Services       Functional Status Assessment Patient has had a recent decline in their functional status and demonstrates the ability to make significant improvements in function in a reasonable and predictable amount of time.     Precautions / Restrictions Precautions Precautions: Fall Restrictions Weight Bearing Restrictions: No      Mobility  Bed Mobility Overal bed mobility: Modified Independent             General bed mobility comments: HOB partially raised    Transfers Overall transfer level: Needs assistance Equipment used: Rolling walker (2 wheels), 1 person hand held assist Transfers: Sit to/from Stand, Bed to chair/wheelchair/BSC Sit to Stand: Supervision   Step pivot transfers: Supervision       General transfer comment: has to lean on walls, nearby objects for suport when taking steps without AD, safer using RW    Ambulation/Gait Ambulation/Gait assistance: Supervision Gait Distance (Feet): 100 Feet Assistive device: Rolling walker (2 wheels) Gait Pattern/deviations: Decreased step length - left, Decreased stance time - right, Decreased stride length Gait velocity: decreased     General Gait Details: slightly labored cadence without loss of balance with good return for ambulating in room/hallway  Stairs            Wheelchair Mobility    Modified Rankin (Stroke Patients Only)  Balance Overall balance assessment: Needs assistance Sitting-balance support: Feet supported, No upper extremity supported Sitting balance-Leahy Scale: Good Sitting balance - Comments:  seated at EOB   Standing balance support: During functional activity, No upper extremity supported Standing balance-Leahy Scale: Poor Standing balance comment: fair/poor without AD, fair/good using RW                             Pertinent Vitals/Pain Pain Assessment Pain Assessment: No/denies pain    Home Living Family/patient expects to be discharged to:: Assisted living                 Home Equipment: Rolling Walker (2 wheels)      Prior Function Prior Level of Function : Needs assist       Physical Assist : Mobility (physical);ADLs (physical) Mobility (physical): Bed mobility;Transfers;Gait;Stairs   Mobility Comments: household ambulator using RW PRN, can ambulate very short household distance leaning on walls, objects for support ADLs Comments: Supervised by ALF staff     Hand Dominance        Extremity/Trunk Assessment   Upper Extremity Assessment Upper Extremity Assessment: Overall WFL for tasks assessed    Lower Extremity Assessment Lower Extremity Assessment: Generalized weakness    Cervical / Trunk Assessment Cervical / Trunk Assessment: Normal  Communication   Communication: No difficulties  Cognition Arousal/Alertness: Awake/alert Behavior During Therapy: WFL for tasks assessed/performed Overall Cognitive Status: Within Functional Limits for tasks assessed                                          General Comments      Exercises     Assessment/Plan    PT Assessment Patient needs continued PT services  PT Problem List Decreased strength;Decreased activity tolerance;Decreased balance;Decreased mobility       PT Treatment Interventions DME instruction;Gait training;Stair training;Functional mobility training;Therapeutic activities;Therapeutic exercise;Patient/family education;Balance training    PT Goals (Current goals can be found in the Care Plan section)  Acute Rehab PT Goals Patient Stated Goal: return  home with ALF staff assisting PT Goal Formulation: With patient Time For Goal Achievement: 12/04/22 Potential to Achieve Goals: Good    Frequency Min 3X/week     Co-evaluation               AM-PAC PT "6 Clicks" Mobility  Outcome Measure Help needed turning from your back to your side while in a flat bed without using bedrails?: None Help needed moving from lying on your back to sitting on the side of a flat bed without using bedrails?: None Help needed moving to and from a bed to a chair (including a wheelchair)?: None Help needed standing up from a chair using your arms (e.g., wheelchair or bedside chair)?: None Help needed to walk in hospital room?: A Little Help needed climbing 3-5 steps with a railing? : A Little 6 Click Score: 22    End of Session   Activity Tolerance: Patient tolerated treatment well;Patient limited by fatigue Patient left: in chair;with call bell/phone within reach Nurse Communication: Mobility status PT Visit Diagnosis: Unsteadiness on feet (R26.81);Other abnormalities of gait and mobility (R26.89);Muscle weakness (generalized) (M62.81)    Time: 0939-1000 PT Time Calculation (min) (ACUTE ONLY): 21 min   Charges:   PT Evaluation $PT Eval Moderate Complexity: 1 Mod PT Treatments $Therapeutic Activity: 8-22 mins  12:05 PM, 12/01/22 Ocie Bob, MPT Physical Therapist with [facility] [phone] office [phone] mobile phone

## 2022-12-01 NOTE — TOC Initial Note (Signed)
Transition of Care [facility]) - Initial/Assessment Note    Patient Details  Name: [name] MRN: [id number] Date of Birth: 1931-12-01  Transition of Care [facility]) CM/SW Contact:    Catalina Gravel, LCSW Phone Number: 12/01/2022, 12:20 PM  Clinical Narrative:                 Pt admitted obs status for Generalized weakness. Pt requiring continued medical work up, DC expected Tuesday. TOC to follow.     Barriers to Discharge: Continued Medical Work up   Patient Goals and CMS Choice            Expected Discharge Plan and Services                                              Prior Living Arrangements/Services                       Activities of Daily Living Home Assistive Devices/Equipment: Environmental consultant (specify type) ADL Screening (condition at time of admission) Patient's cognitive ability adequate to safely complete daily activities?: Yes Is the patient deaf or have difficulty hearing?: No Does the patient have difficulty seeing, even when wearing glasses/contacts?: No Does the patient have difficulty concentrating, remembering, or making decisions?: No Patient able to express need for assistance with ADLs?: Yes Does the patient have difficulty dressing or bathing?: No Independently performs ADLs?: Yes (appropriate for developmental age) Does the patient have difficulty walking or climbing stairs?: No Weakness of Legs: None Weakness of Arms/Hands: None  Permission Sought/Granted                  Emotional Assessment              Admission diagnosis:  Hyponatremia [E87.1] Generalized weakness [R53.1] Patient Active Problem List   Diagnosis Date Noted   Generalized weakness 11/30/2022   Acute diarrhea 11/30/2022   Hypomagnesemia 11/30/2022   Hypertensive urgency 11/30/2022   Upper respiratory tract infection 11/30/2022   Diabetic neuropathy (HCC) 11/30/2022   S/P total knee arthroplasty, right 04/12/2022   Hypertension associated with  type 2 diabetes mellitus (HCC) 02/25/2022   Aortic atherosclerosis (HCC) 02/25/2022   Chronic non-seasonal allergic rhinitis 02/25/2022   Diabetic peripheral neuropathy associated with type 2 diabetes mellitus (HCC) 02/25/2022   Chronic anxiety 02/25/2022   Pneumonia 02/17/2022   GERD (gastroesophageal reflux disease) 02/17/2022   Myalgia due to statin 02/15/2020   Cognitive dysfunction 03/04/2019   Generalized anxiety disorder 06/05/2018   Floaters in visual field, bilateral 03/23/2018   Living in assisted living 03/23/2018   Hyponatremia 03/08/2018   C. difficile diarrhea 03/03/2018   Osteoarthritis of right knee 12/02/2017   Closed fracture of left orbital floor with routine healing 01/17/2017   Hyperlipidemia associated with type 2 diabetes mellitus (HCC) 10/02/2016   Pedal edema 10/02/2016   Insomnia 10/02/2016   SBO (small bowel obstruction) (HCC) 07/02/2016   Dyspnea on exertion 02/24/2016   Other fatigue    Leukocytosis 12/16/2014   Essential hypertension 12/15/2014   Type 2 diabetes mellitus with atherosclerosis of aorta (HCC) 02/15/2011   PCP:  Babs Sciara, MD Pharmacy:   [facility] [place] - [address] [place] [address] Phone: [phone] Fax: [phone]     Social Determinants of Health (SDOH) Social History: SDOH Screenings   Food  Insecurity: No Food Insecurity (11/30/2022)  Housing: Patient Declined (11/30/2022)  Transportation Needs: No Transportation Needs (11/30/2022)  Utilities: Not At Risk (11/30/2022)  Alcohol Screen: Low Risk  (09/06/2022)  Depression (PHQ2-9): Low Risk  (09/06/2022)  Financial Resource Strain: Low Risk  (09/06/2022)  Physical Activity: Sufficiently Active (09/06/2022)  Social Connections: Moderately Isolated (09/06/2022)  Stress: No Stress Concern Present (09/06/2022)  Tobacco Use: Low Risk  (11/30/2022)   SDOH Interventions:     Readmission Risk Interventions     No data to display

## 2022-12-01 NOTE — Care Management Obs Status (Signed)
MEDICARE OBSERVATION STATUS NOTIFICATION   Patient Details  Name: [name] MRN: [id number] Date of Birth: 1931/07/25   Medicare Observation Status Notification Given:  Yes    Katrese Shell Marsh Dolly, LCSW 12/01/2022, 12:30 PM

## 2022-12-01 NOTE — Progress Notes (Signed)
PROGRESS NOTE     [name], is a 89 y.o. female, DOB - [birth year], ZOX:[id number]  Admit date - 11/30/2022   Admitting Physician Frankey Shown, DO  Outpatient Primary MD for the patient is Luking, Linda Coup, MD  LOS - 0  Chief Complaint  Patient presents with   Weakness        Brief Narrative:  -89 y.o. female with medical history significant of essential hypertension, T2DM, GERD admitted on 11/29/2020 from [facility] with diarrhea, dehydration, generalized weakness and electrolyte derangement    -Assessment and Plan: 1)Electrolyte Abnormalities--- in the setting of ongoing diarrhea and dehydration  --on admission patient had hyponatremia/hypokalemia/hypomagnesemia -Hydrate and replace electrolytes  2)Diarrhea--- as per patient for almost 2 weeks she has had watery stools -Check stool for C. difficile and stool for GI pathogen No fever  Or chills  -No leukocytosis and abdominal exam is benign  3) URI symptoms--- started on amoxicillin about 4 days prior to admission -Given ongoing diarrhea stop amoxicillin -Respiratory symptoms appears to have improved -Chest x-ray suggest chronic atelectasis versus scarring without frank acute finding -Mucolytic's and bronchodilators as ordered  4)HTN--continue amlodipine and losartan 100 mg daily  5)GERD--- continue Protonix 40 mg daily  6)Generalized Weakness--PT eval appreciated, recommends home health PT  7)DM2--- A1c 6.8, reflecting good diabetic control PTA -Use Novolog/Humalog Sliding scale insulin with Accu-Cheks/Fingersticks as ordered   8) diabetic neuropathy----continue gabapentin  9) chronic anemia--- Hgb dropped on admission due to hemodilution from IV fluids -No acute bleeding concerns at this time  Status is: Inpatient   Disposition: The patient is from: ALF              Anticipated d/c is to: ALF with HH orders              Anticipated d/c date is: 1 day              Patient currently is not medically  stable to d/c. Barriers: Not Clinically Stable-   Code Status :  -  Code Status: DNR   Family Communication:    NA (patient is alert, awake and coherent)   DVT Prophylaxis  :   - SCDs   enoxaparin (LOVENOX) injection 40 mg Start: 11/30/22 2100 SCDs Start: 11/30/22 2042   Lab Results  Component Value Date   PLT 264 12/01/2022    Inpatient Medications  Scheduled Meds:  amLODipine  10 mg Oral Daily   amoxicillin  500 mg Oral TID   cholecalciferol  400 Units Oral Daily   cyanocobalamin  1,000 mcg Oral QHS   dextromethorphan-guaiFENesin  1 tablet Oral BID   enoxaparin (LOVENOX) injection  40 mg Subcutaneous Q24H   gabapentin  200 mg Oral BID   insulin aspart  0-9 Units Subcutaneous TID WC   losartan  100 mg Oral Daily   melatonin  3 mg Oral QHS   omega-3 acid ethyl esters  1 g Oral Daily   pantoprazole  40 mg Oral Daily   Continuous Infusions: PRN Meds:.acetaminophen **OR** acetaminophen, ipratropium-albuterol, ondansetron **OR** ondansetron (ZOFRAN) IV   Anti-infectives (From admission, onward)    Start     Dose/Rate Route Frequency Ordered Stop   11/30/22 2200  amoxicillin (AMOXIL) capsule 500 mg        500 mg Oral 3 times daily 11/30/22 2114           Subjective: Markeeta Holtzer today has no fevers ,  No chest pain,   - Appetite is not  great -Complains of fatigue, malaise and generalized weakness - Nausea but no emesis -Had loose stool earlier today   Objective: Vitals:   11/30/22 2059 12/01/22 0101 12/01/22 0442 12/01/22 1335  BP: (!) 155/64 (!) 161/60 (!) 147/48 (!) 143/54  Pulse: 87 68 (!) 56 69  Resp: 16 14 14 14   Temp: 97.7 F (36.5 C) 97.8 F (36.6 C) 98 F (36.7 C) 97.8 F (36.6 C)  TempSrc: Oral Oral Oral Oral  SpO2: 99% 95% 98% 96%  Weight:      Height:        Intake/Output Summary (Last 24 hours) at 12/01/2022 1453 Last data filed at 12/01/2022 1300 Gross per 24 hour  Intake 1200.24 ml  Output --  Net 1200.24 ml   Filed Weights    11/30/22 1402 11/30/22 2046  Weight: 76.7 kg 76.5 kg   Physical Exam Gen:- Awake Alert,  in no apparent distress  HEENT:- [place].AT, No sclera icterus Neck-Supple Neck,No JVD,.  Lungs-  CTAB , fair symmetrical air movement CV- S1, S2 normal, regular  Abd-  +ve B.Sounds, Abd Soft, No tenderness,    Extremity/Skin:- No  edema, pedal pulses present  Psych-affect is appropriate, oriented x3 Neuro-no new focal deficits, no tremors  Data Reviewed: I have personally reviewed following labs and imaging studies  CBC: Recent Labs  Lab 11/30/22 1416 12/01/22 0411  WBC 8.1 6.5  NEUTROABS 5.1  --   HGB 13.4 10.9*  HCT 39.6 33.4*  MCV 84.6 87.2  PLT 324 264   Basic Metabolic Panel: Recent Labs  Lab 11/30/22 1416 11/30/22 1656 11/30/22 2142 12/01/22 0411  NA 125* 128* 128* 129*  K 4.0 4.0 3.6 3.4*  CL 87* 92* 93* 95*  CO2 25 25 25 26   GLUCOSE 124* 137* 174* 116*  BUN 15 13 12 14   CREATININE 0.66 0.69 0.85 0.75  CALCIUM 9.4 8.7* 8.7* 8.3*  MG 1.5*  --   --  2.1  PHOS  --   --   --  3.5   GFR: Estimated Creatinine Clearance: 48.1 mL/min (by C-G formula based on SCr of 0.75 mg/dL). Liver Function Tests: Recent Labs  Lab 11/30/22 1416 12/01/22 0411  AST 18 13*  ALT 16 12  ALKPHOS 106 72  BILITOT 1.0 0.9  PROT 7.9 5.7*  ALBUMIN 4.3 3.1*   Recent Results (from the past 240 hour(s))  Novel Coronavirus, NAA ([facility])     Status: None   Collection Time: 11/27/22 12:00 AM   Specimen: Nasopharyngeal(NP) swabs in vial transport medium   Nasopharynge  Previous  Result Value Ref Range Status   SARS-CoV-2, NAA Not Detected Not Detected Final    Comment: This nucleic acid amplification test was developed and its performance characteristics determined by [employer]. Nucleic acid amplification tests include RT-PCR and TMA. This test has not been FDA cleared or approved. This test has been authorized by FDA under an Emergency Use Authorization (EUA). This test is only  authorized for the duration of time the declaration that circumstances exist justifying the authorization of the emergency use of in vitro diagnostic tests for detection of SARS-CoV-2 virus and/or diagnosis of COVID-19 infection under section 564(b)(1) of the Act, 21 U.S.C. 161WRU-0(A) (1), unless the authorization is terminated or revoked sooner. When diagnostic testing is negative, the possibility of a false negative result should be considered in the context of a patient's recent exposures and the presence of clinical signs and symptoms consistent with COVID-19. An individual without symptoms of COVID-19  and who is not shedding SARS-CoV-2 virus wo uld expect to have a negative (not detected) result in this assay.     Radiology Studies: DG Chest 2 View  Result Date: 11/30/2022 CLINICAL DATA:  Cough for 10 days EXAM: CHEST - 2 VIEW COMPARISON:  February 22, 2022 FINDINGS: The cardiomediastinal silhouette is stable. No pneumothorax. Mild haziness in the bases is likely overlapping soft tissues. No effusions identified based on the lateral view. Right middle lobe opacity, not significantly changed in the interval. IMPRESSION: Right middle lobe opacity is not significantly changed in the interval raising the possibility of chronic atelectasis or scarring given chronicity. No acute interval changes. Electronically Signed   By: Gerome Sam III M.D.   On: 11/30/2022 14:50    Scheduled Meds:  amLODipine  10 mg Oral Daily   amoxicillin  500 mg Oral TID   cholecalciferol  400 Units Oral Daily   cyanocobalamin  1,000 mcg Oral QHS   dextromethorphan-guaiFENesin  1 tablet Oral BID   enoxaparin (LOVENOX) injection  40 mg Subcutaneous Q24H   gabapentin  200 mg Oral BID   insulin aspart  0-9 Units Subcutaneous TID WC   losartan  100 mg Oral Daily   melatonin  3 mg Oral QHS   omega-3 acid ethyl esters  1 g Oral Daily   pantoprazole  40 mg Oral Daily   Continuous Infusions:   LOS: 0 days    Shon Hale M.D on 12/01/2022 at 2:53 PM  Go to [email or web address] - for contact info  [facility] - Office  [phone]  If 7PM-7AM, please contact night-coverage [email or web address] 12/01/2022, 2:53 PM

## 2022-12-01 NOTE — Plan of Care (Signed)
  Problem: Acute Rehab PT Goals(only PT should resolve) Goal: Pt Will Go Supine/Side To Sit Outcome: Progressing Flowsheets (Taken 12/01/2022 1207) Pt will go Supine/Side to Sit:  Independently  with modified independence Goal: Patient Will Transfer Sit To/From Stand Outcome: Progressing Flowsheets (Taken 12/01/2022 1207) Patient will transfer sit to/from stand: with modified independence Goal: Pt Will Transfer Bed To Chair/Chair To Bed Outcome: Progressing Flowsheets (Taken 12/01/2022 1207) Pt will Transfer Bed to Chair/Chair to Bed: with modified independence Goal: Pt Will Ambulate Outcome: Progressing Flowsheets (Taken 12/01/2022 1207) Pt will Ambulate:  > 125 feet  with modified independence  with supervision  with rolling walker   12:07 PM, 12/01/22 Ocie Bob, MPT Physical Therapist with [facility] [phone] office [phone] mobile phone

## 2022-12-02 DIAGNOSIS — R531 Weakness: Secondary | ICD-10-CM | POA: Diagnosis not present

## 2022-12-02 LAB — GLUCOSE, CAPILLARY
Glucose-Capillary: 109 mg/dL — ABNORMAL HIGH (ref 70–99)
Glucose-Capillary: 114 mg/dL — ABNORMAL HIGH (ref 70–99)
Glucose-Capillary: 200 mg/dL — ABNORMAL HIGH (ref 70–99)
Glucose-Capillary: 131 mg/dL — ABNORMAL HIGH (ref 70–99)

## 2022-12-02 LAB — BASIC METABOLIC PANEL WITH GFR
Anion gap: 7 (ref 5–15)
BUN: 11 mg/dL (ref 8–23)
CO2: 25 mmol/L (ref 22–32)
Calcium: 8.5 mg/dL — ABNORMAL LOW (ref 8.9–10.3)
Chloride: 101 mmol/L (ref 98–111)
Creatinine, Ser: 0.77 mg/dL (ref 0.44–1.00)
GFR, Estimated: >60 mL/min
Glucose, Bld: 113 mg/dL — ABNORMAL HIGH (ref 70–99)
Potassium: 4.1 mmol/L (ref 3.5–5.1)
Sodium: 133 mmol/L — ABNORMAL LOW (ref 135–145)

## 2022-12-02 LAB — C DIFFICILE QUICK SCREEN W PCR REFLEX
C Diff antigen: NEGATIVE
C Diff interpretation: NOT DETECTED
C Diff toxin: NEGATIVE

## 2022-12-02 MED ORDER — METFORMIN HCL 500 MG PO TABS
ORAL_TABLET | ORAL | 6 refills | Status: DC
Start: 2022-12-02 — End: 2023-10-07

## 2022-12-02 MED ORDER — AMLODIPINE BESYLATE 10 MG PO TABS
10.0000 mg | ORAL_TABLET | Freq: Every day | ORAL | 6 refills | Status: DC
Start: 2022-12-02 — End: 2023-10-07

## 2022-12-02 MED ORDER — PANTOPRAZOLE SODIUM 40 MG PO TBEC: 40.0000 mg | DELAYED_RELEASE_TABLET | Freq: Every day | ORAL | 2 refills | Status: DC

## 2022-12-02 MED ORDER — LOSARTAN POTASSIUM 100 MG PO TABS
ORAL_TABLET | ORAL | 6 refills | Status: DC
Start: 2022-12-02 — End: 2023-05-06

## 2022-12-02 MED ORDER — LOPERAMIDE HCL 2 MG PO CAPS: 2.0000 mg | ORAL_CAPSULE | ORAL | 0 refills | Status: DC | PRN

## 2022-12-02 NOTE — [place] FL2 (Signed)
[place] MEDICAID FL2 LEVEL OF CARE FORM     IDENTIFICATION  Patient Name: [name] Birthdate: 02-11-1932 Sex: female Admission Date (Current Location): 11/30/2022  [facility] and [place] Number:  [employer] and Address:  [facility],  [address] [place] [address]      Provider Number: [id number]  Attending Physician Name and Address:  Shon Hale, MD  Relative Name and Phone Number:  Jiyah, Teachman (DIL) (Daughter) [phone]    Current Level of Care: Hospital Recommended Level of Care: Assisted Living Facility Prior Approval Number:    Date Approved/Denied:   PASRR Number:    Discharge Plan: Domiciliary (Rest home) (ALF)    Current Diagnoses: Patient Active Problem List   Diagnosis Date Noted   Dehydration 12/01/2022   Generalized weakness 11/30/2022   Acute diarrhea 11/30/2022   Hypomagnesemia 11/30/2022   Hypertensive urgency 11/30/2022   Upper respiratory tract infection 11/30/2022   Diabetic neuropathy (HCC) 11/30/2022   S/P total knee arthroplasty, right 04/12/2022   Hypertension associated with type 2 diabetes mellitus (HCC) 02/25/2022   Aortic atherosclerosis (HCC) 02/25/2022   Chronic non-seasonal allergic rhinitis 02/25/2022   Diabetic peripheral neuropathy associated with type 2 diabetes mellitus (HCC) 02/25/2022   Chronic anxiety 02/25/2022   Pneumonia 02/17/2022   GERD (gastroesophageal reflux disease) 02/17/2022   Myalgia due to statin 02/15/2020   Cognitive dysfunction 03/04/2019   Generalized anxiety disorder 06/05/2018   Floaters in visual field, bilateral 03/23/2018   Living in assisted living 03/23/2018   Hyponatremia 03/08/2018   C. difficile diarrhea 03/03/2018   Osteoarthritis of right knee 12/02/2017   Closed fracture of left orbital floor with routine healing 01/17/2017   Hyperlipidemia associated with type 2 diabetes mellitus (HCC) 10/02/2016   Pedal edema 10/02/2016   Insomnia 10/02/2016   SBO  (small bowel obstruction) (HCC) 07/02/2016   Dyspnea on exertion 02/24/2016   Other fatigue    Leukocytosis 12/16/2014   Essential hypertension 12/15/2014   Type 2 diabetes mellitus with atherosclerosis of aorta (HCC) 02/15/2011    Orientation RESPIRATION BLADDER Height & Weight     Self, Time, Situation, Place  Normal Continent Weight: 168 lb 11.2 oz (76.5 kg) Height:  5\' 8"  (172.7 cm)  BEHAVIORAL SYMPTOMS/MOOD NEUROLOGICAL BOWEL NUTRITION STATUS      Continent  (avoid coffee/caffeinated beverages, Tea, chocolate, carbonated drinks/soda, spicy food, Milk,  Alcohol, acidic foods- such as citrus (Lemon, oranges), juices  and tomatoes,      avoid milk and dairy products as noted above  drink plenty fluids---)  AMBULATORY STATUS COMMUNICATION OF NEEDS Skin   Supervision (uses walker) Verbally Normal                       Personal Care Assistance Level of Assistance  Bathing, Feeding, Dressing Bathing Assistance: Limited assistance Feeding assistance: Independent Dressing Assistance: Limited assistance     Functional Limitations Info  Sight, Hearing, Speech Sight Info: Adequate Hearing Info: Adequate Speech Info: Adequate    SPECIAL CARE FACTORS FREQUENCY  PT (By licensed PT)     PT Frequency: 3x/week              Contractures Contractures Info: Not present    Additional Factors Info  Code Status, Allergies Code Status Info: DNR Allergies Info: Hydrocortisone, Lisinopril, Phenergan (Promethazine Hcl), Statins, Trazodone And Nefazodone, Prednisone           Current Medications (12/02/2022):  This is the current hospital active medication list Current  Facility-Administered Medications  Medication Dose Route Frequency Provider Last Rate Last Admin   acetaminophen (TYLENOL) tablet 650 mg  650 mg Oral Q6H PRN Adefeso, Oladapo, DO   650 mg at 12/01/22 2237   Or   acetaminophen (TYLENOL) suppository 650 mg  650 mg Rectal Q6H PRN Adefeso, Oladapo, DO        amLODipine (NORVASC) tablet 10 mg  10 mg Oral Daily Adefeso, Oladapo, DO   10 mg at 12/02/22 0859   cholecalciferol (VITAMIN D3) 10 MCG (400 UNIT) tablet 400 Units  400 Units Oral Daily Adefeso, Oladapo, DO   400 Units at 12/02/22 0900   cyanocobalamin (VITAMIN B12) tablet 1,000 mcg  1,000 mcg Oral QHS Adefeso, Oladapo, DO   1,000 mcg at 12/01/22 2232   dextromethorphan-guaiFENesin (MUCINEX DM) 30-600 MG per 12 hr tablet 1 tablet  1 tablet Oral BID Adefeso, Oladapo, DO   1 tablet at 12/02/22 0859   enoxaparin (LOVENOX) injection 40 mg  40 mg Subcutaneous Q24H Adefeso, Oladapo, DO   40 mg at 12/01/22 2040   gabapentin (NEURONTIN) capsule 200 mg  200 mg Oral BID Adefeso, Oladapo, DO   200 mg at 12/02/22 0859   insulin aspart (novoLOG) injection 0-9 Units  0-9 Units Subcutaneous TID WC Adefeso, Oladapo, DO   1 Units at 12/01/22 1145   ipratropium-albuterol (DUONEB) 0.5-2.5 (3) MG/3ML nebulizer solution 3 mL  3 mL Nebulization Q6H PRN Adefeso, Oladapo, DO       losartan (COZAAR) tablet 100 mg  100 mg Oral Daily Adefeso, Oladapo, DO   100 mg at 12/02/22 0859   melatonin tablet 3 mg  3 mg Oral QHS Adefeso, Oladapo, DO   3 mg at 12/01/22 2232   omega-3 acid ethyl esters (LOVAZA) capsule 1 g  1 g Oral Daily Adefeso, Oladapo, DO   1 g at 12/02/22 0900   ondansetron (ZOFRAN) tablet 4 mg  4 mg Oral Q6H PRN Adefeso, Oladapo, DO       Or   ondansetron (ZOFRAN) injection 4 mg  4 mg Intravenous Q6H PRN Adefeso, Oladapo, DO       pantoprazole (PROTONIX) EC tablet 40 mg  40 mg Oral Daily Adefeso, Oladapo, DO   40 mg at 12/02/22 0859     Discharge Medications: TAKE these medications     acetaminophen 325 MG tablet Commonly known as: TYLENOL TAKE 2 TABLETS(650MG ) BY MOUTH EVERY 6 HOURS AS NEEDED FOR PAIN/TEMPERATURE. What changed: See the new instructions.    amLODipine 10 MG tablet Commonly known as: NORVASC Take 1 tablet (10 mg total) by mouth daily.    benzonatate 100 MG capsule Commonly known as:  TESSALON TAKE (1) CAPSULE BY MOUTH 3 TIMES DAILY AS NEEDED FOR COUGH. What changed: See the new instructions.    bisacodyl 5 MG EC tablet Commonly known as: DULCOLAX Take 1 tablet (5 mg total) by mouth daily as needed for moderate constipation.    Combivent Respimat 20-100 MCG/ACT Aers respimat Generic drug: Ipratropium-Albuterol INHALE (1) PUFF INTO THE LUNGS EVERY SIX HOURS AS NEEDED FOR WHEEZING OR SHORTNESS OF BREATH. What changed: See the new instructions.    cyanocobalamin 1000 MCG tablet Take 1,000 mcg by mouth at bedtime.    dicyclomine 10 MG capsule Commonly known as: BENTYL TAKE 1 CAPSULE BY MOUTH TWICE DAILY AS NEEDED FOR ABDOMINAL CRAMPS AND LOOSE STOOL.    EasyMax Test test strip Generic drug: glucose blood CHECK BLOOD SUGAR ONCE DAILY.(CALL MD IF BS BELOW 60: OR IF BS ABOVE  400)    Fish Oil 1000 MG Caps One capsule BID    gabapentin 100 MG capsule Commonly known as: NEURONTIN 2 po bid What changed:  how much to take how to take this when to take this additional instructions    GoodSense Hemorrhoidal 0.25-14-74.9 % rectal ointment Generic drug: phenylephrine-shark liver oil-mineral oil-petrolatum Place 1 Application rectally 3 (three) times daily as needed for irritation.    loperamide 2 MG capsule Commonly known as: IMODIUM Take 1 capsule (2 mg total) by mouth as needed for diarrhea or loose stools.    loratadine 10 MG tablet Commonly known as: CLARITIN Take 10 mg by mouth daily.    losartan 100 MG tablet Commonly known as: COZAAR Take 1 tablet daily    Melatonin 3 MG Subl TAKE (1) TABLET BY MOUTH AT BEDTIME. What changed: See the new instructions.    metFORMIN 500 MG tablet Commonly known as: GLUCOPHAGE Take one tablet 500mg  po in the morning and 1/2 tablet 250 mg po at supper    pantoprazole 40 MG tablet Commonly known as: PROTONIX Take 1 tablet (40 mg total) by mouth daily. Start taking on: December 03, 2022 What changed:  medication  strength how much to take how to take this when to take this additional instructions    PreserVision AREDS 2 Caps Take 1 capsule by mouth in the morning and at bedtime. What changed: Another medication with the same name was changed. Make sure you understand how and when to take each.    beta carotene w/minerals tablet Take 1 tablet by mouth at bedtime. What changed: when to take this    Theratears 0.25 % Soln Generic drug: Carboxymethylcellulose Sodium Apply 1 drop to eye in the morning and at bedtime.    Vitamin D (Cholecalciferol) 10 MCG (400 UNIT) Tabs TAKE 1 TABLET BY MOUTH ONCE DAILY.    Relevant Imaging Results:  Relevant Lab Results:   Additional Information SSN: [id number]  Sully Manzi, Juleen China, LCSW

## 2022-12-02 NOTE — Progress Notes (Signed)
Patient discharged back to [facility] facility, discharge packet given to Audry Pili care taker at the facility IV discontinued,catheter intact. Accompanied by staff to an awaiting vehicle.Marland Kitchen

## 2022-12-02 NOTE — Care Management Important Message (Signed)
Important Message  Patient Details  Name: [name] MRN: [id number] Date of Birth: Jun 13, 1932   Medicare Important Message Given:  N/A - LOS <3 / Initial given by admissions     Corey Harold 12/02/2022, 9:21 AM

## 2022-12-02 NOTE — Discharge Instructions (Addendum)
1)Please avoid coffee/caffeinated beverages, Tea, chocolate, carbonated drinks/soda,  spicy food, Milk,  Alcohol, acidic foods- such as citrus (Lemon, oranges), juices  and tomatoes,   2)Avoid milk and dairy products as noted above  3)Drink plenty fluids---  4)Follow up with Babs Sciara, MD in about a week or so for recheck

## 2022-12-02 NOTE — Care Management Important Message (Signed)
Important Message  Patient Details  Name: [name] MRN: [id number] Date of Birth: 01-25-1932   Medicare Important Message Given:  N/A - LOS <3 / Initial given by admissions     Corey Harold 12/02/2022, 9:20 AM

## 2022-12-02 NOTE — Progress Notes (Signed)
[name] rested throughout the night. She was able to ambulate to the bathroom without assistance.She did not have a bowel movement during shift so fecal sample not collected. Will pass on status  to day nurse to collect.

## 2022-12-02 NOTE — Discharge Summary (Signed)
[name], is a 89 y.o. female  DOB 1932/02/16  MRN [id number].  Admission date:  11/30/2022  Admitting Physician  Shon Hale, MD  Discharge Date:  12/02/2022   Primary MD  Babs Sciara, MD  Recommendations for primary care physician for things to follow:   1)Please avoid coffee/caffeinated beverages, Tea, chocolate, carbonated drinks/soda,  spicy food, Milk,  Alcohol, acidic foods- such as citrus (Lemon, oranges), juices  and tomatoes,   2)Avoid milk and dairy products as noted above  3)Drink plenty fluids---  4)Follow up with Babs Sciara, MD in about a week or so for recheck  Admission Diagnosis  Hyponatremia [E87.1] Generalized weakness [R53.1] Dehydration [E86.0]   Discharge Diagnosis  Hyponatremia [E87.1] Generalized weakness [R53.1] Dehydration [E86.0]    Principal Problem:   Generalized weakness Active Problems:   Type 2 diabetes mellitus with atherosclerosis of aorta (HCC)   Essential hypertension   Insomnia   Hyponatremia   GERD (gastroesophageal reflux disease)   Acute diarrhea   Hypomagnesemia   Hypertensive urgency   Upper respiratory tract infection   Diabetic neuropathy (HCC)   Dehydration      Past Medical History:  Diagnosis Date   Cognitive dysfunction 03/04/2019   Patient scores 21 out of 30 on [place] cognitive assessment September 2020   Diabetes mellitus without complication (HCC)    Diabetic peripheral neuropathy associated with type 2 diabetes mellitus (HCC) 02/25/2022   Diverticulitis    Frailty 03/04/2019   H/O bilateral breast reduction surgery    Hyperlipidemia    a. intolerant to statins.    Hypertension     Past Surgical History:  Procedure Laterality Date   ABDOMINAL HYSTERECTOMY  2003   APPENDECTOMY     age 26   BREAST REDUCTION SURGERY     age 89   COLON SURGERY Left 2011   Hemicolectomy due to diverticulitis   EYE SURGERY   03/30/2009   cataract   KNEE SURGERY Right 2003   knee cap   LAPAROSCOPIC INCISIONAL / UMBILICAL / VENTRAL HERNIA REPAIR  02/23/2007   REDUCTION MAMMAPLASTY Bilateral 2001   REFRACTIVE SURGERY     TOTAL KNEE ARTHROPLASTY Right 04/10/2022   Procedure: TOTAL KNEE ARTHROPLASTY;  Surgeon: Eugenia Mcalpine, MD;  Location: [facility];  Service: Orthopedics;  Laterality: Right;  adductor canal       HPI  from the history and physical done on the day of admission:    HPI: [name] is a 89 y.o. female with medical history significant of essential hypertension, T2DM, GERD who presents to the emergency department from [facility] via EMS due to generalized weakness.  Patient complaining of 2-week onset of diarrhea with 2-3 loose bowel movements daily except for today that she only had 1 loose movement.  She was started on amoxicillin about 4 days ago due to upper respiratory tract infection with productive cough of yellow sputum which started about 10 days ago.  She woke up this morning complaining of weakness and fatigue with decreased exercise  tolerance (usually walks 1500 steps daily, but was unable to do that today), this was associated with nausea without vomiting.  She complained of decreased appetite due to being able to only eat a small portion of her food today.  She denies chest pain, shortness of breath, fever, chills, abdominal pain.   ED Course:  In the emergency department, BP was 174/61, other vital signs are within normal range.  Workup in the ED showed normal CBC, BMP showed sodium 125, potassium 4.0, chloride 87, bicarb 25, blood glucose 124, BUN 15, creatinine 0.66.  Urinalysis was normal, magnesium 1.5. Chest x-ray showed right middle lobe opacity is not significantly changed in the interval raising the possibility of chronic atelectasis or scarring given chronicity. No acute interval changes. IV hydration was provided, magnesium 1 g was given.  Repeat BMP showed sodium of 128, chloride  92.  Hospitalist was asked to admit patient for further evaluation and management.     Review of Systems: Review of systems as noted in the HPI. All other systems reviewed and are negative.       Hospital Course:   Brief Narrative:  -89 y.o. female with medical history significant of essential hypertension, T2DM, GERD admitted on 11/29/2020 from [facility] with diarrhea, dehydration, generalized weakness and electrolyte derangement     -Assessment and Plan: 1)Electrolyte Abnormalities--- in the setting of ongoing diarrhea and dehydration  --on admission patient had hyponatremia/hypokalemia/hypomagnesemia --Patient was hydrated, electrolytes were replaced   2)Diarrhea--- as per patient for almost 2 weeks she has had watery stools - stool for C. difficile is negative,  -GI pathogen/stool culture pending -no fever  Or chills  -No leukocytosis and abdominal exam is benign   3) URI symptoms--- started on amoxicillin about 4 days prior to admission -Given ongoing diarrhea stopped amoxicillin -Respiratory symptoms appears to have improved -Chest x-ray suggest chronic atelectasis versus scarring without frank acute finding -Mucolytic's and bronchodilators as ordered   4)HTN--continue amlodipine and losartan 100 mg daily   5)GERD--- continue Protonix 40 mg daily   6)Generalized Weakness--PT eval appreciated, recommends home health PT   7)DM2--- A1c 6.8, reflecting good diabetic control PTA -Restart PTA metformin  8) diabetic neuropathy----continue gabapentin   9) chronic anemia--- Hgb dropped on admission due to hemodilution from IV fluids -No acute bleeding concerns at this time  10) generalized weakness and deconditioning--- PT eval appreciated recommends home health PT    Disposition: The patient is from: ALF              Anticipated d/c is to: ALF with HH orders  Discharge Condition: stable...   Follow UP   Contact information for after-discharge care      Destination     [facility] .   Service: Assisted Living Contact information: [address] [place] [address] [phone]                     Diet and Activity recommendation:  As advised  Discharge Instructions    Discharge Instructions     Call MD for:  difficulty breathing, headache or visual disturbances   Complete by: As directed    Call MD for:  persistant dizziness or light-headedness   Complete by: As directed    Call MD for:  persistant nausea and vomiting   Complete by: As directed    Call MD for:  temperature >100.4   Complete by: As directed    Diet -  low sodium heart healthy   Complete by: As directed    Discharge instructions   Complete by: As directed    1)Please avoid coffee/caffeinated beverages, Tea, chocolate, carbonated drinks/soda,  spicy food, Milk,  Alcohol, acidic foods- such as citrus (Lemon, oranges), juices  and tomatoes,   2) avoid milk and dairy products as noted above  3) drink plenty fluids---  4)follow up with Babs Sciara, MD in about a week or so for recheck   Increase activity slowly   Complete by: As directed        Discharge Medications     Allergies as of 12/02/2022       Reactions   Hydrocortisone Itching   Lisinopril    Other reaction(s): Lethargy (intolerance)   Phenergan [promethazine Hcl] Other (See Comments)   hallucinations   Statins Other (See Comments)   Severe myalgias   Trazodone And Nefazodone Cough   Prednisone Rash        Medication List     STOP taking these medications    amoxicillin 500 MG capsule Commonly known as: AMOXIL   famotidine 20 MG tablet Commonly known as: PEPCID       TAKE these medications    acetaminophen 325 MG tablet Commonly known as: TYLENOL TAKE 2 TABLETS(650MG ) BY MOUTH EVERY 6 HOURS AS NEEDED FOR PAIN/TEMPERATURE. What changed: See the new instructions.   amLODipine 10 MG tablet Commonly known as:  NORVASC Take 1 tablet (10 mg total) by mouth daily.   benzonatate 100 MG capsule Commonly known as: TESSALON TAKE (1) CAPSULE BY MOUTH 3 TIMES DAILY AS NEEDED FOR COUGH. What changed: See the new instructions.   bisacodyl 5 MG EC tablet Commonly known as: DULCOLAX Take 1 tablet (5 mg total) by mouth daily as needed for moderate constipation.   Combivent Respimat 20-100 MCG/ACT Aers respimat Generic drug: Ipratropium-Albuterol INHALE (1) PUFF INTO THE LUNGS EVERY SIX HOURS AS NEEDED FOR WHEEZING OR SHORTNESS OF BREATH. What changed: See the new instructions.   cyanocobalamin 1000 MCG tablet Take 1,000 mcg by mouth at bedtime.   dicyclomine 10 MG capsule Commonly known as: BENTYL TAKE 1 CAPSULE BY MOUTH TWICE DAILY AS NEEDED FOR ABDOMINAL CRAMPS AND LOOSE STOOL.   EasyMax Test test strip Generic drug: glucose blood CHECK BLOOD SUGAR ONCE DAILY.(CALL MD IF BS BELOW 60: OR IF BS ABOVE 400)   Fish Oil 1000 MG Caps One capsule BID   gabapentin 100 MG capsule Commonly known as: NEURONTIN 2 po bid What changed:  how much to take how to take this when to take this additional instructions   GoodSense Hemorrhoidal 0.25-14-74.9 % rectal ointment Generic drug: phenylephrine-shark liver oil-mineral oil-petrolatum Place 1 Application rectally 3 (three) times daily as needed for irritation.   loperamide 2 MG capsule Commonly known as: IMODIUM Take 1 capsule (2 mg total) by mouth as needed for diarrhea or loose stools.   loratadine 10 MG tablet Commonly known as: CLARITIN Take 10 mg by mouth daily.   losartan 100 MG tablet Commonly known as: COZAAR Take 1 tablet daily   Melatonin 3 MG Subl TAKE (1) TABLET BY MOUTH AT BEDTIME. What changed: See the new instructions.   metFORMIN 500 MG tablet Commonly known as: GLUCOPHAGE Take one tablet 500mg  po in the morning and 1/2 tablet 250 mg po at supper   pantoprazole 40 MG tablet Commonly known as: PROTONIX Take 1 tablet (40  mg total) by mouth daily. Start taking on: December 03, 2022 What  changed:  medication strength how much to take how to take this when to take this additional instructions   PreserVision AREDS 2 Caps Take 1 capsule by mouth in the morning and at bedtime. What changed: Another medication with the same name was changed. Make sure you understand how and when to take each.   beta carotene w/minerals tablet Take 1 tablet by mouth at bedtime. What changed: when to take this   Theratears 0.25 % Soln Generic drug: Carboxymethylcellulose Sodium Apply 1 drop to eye in the morning and at bedtime.   Vitamin D (Cholecalciferol) 10 MCG (400 UNIT) Tabs TAKE 1 TABLET BY MOUTH ONCE DAILY.       Major procedures and Radiology Reports - PLEASE review detailed and final reports for all details, in brief -   DG Chest 2 View  Result Date: 11/30/2022 CLINICAL DATA:  Cough for 10 days EXAM: CHEST - 2 VIEW COMPARISON:  February 22, 2022 FINDINGS: The cardiomediastinal silhouette is stable. No pneumothorax. Mild haziness in the bases is likely overlapping soft tissues. No effusions identified based on the lateral view. Right middle lobe opacity, not significantly changed in the interval. IMPRESSION: Right middle lobe opacity is not significantly changed in the interval raising the possibility of chronic atelectasis or scarring given chronicity. No acute interval changes. Electronically Signed   By: Gerome Sam III M.D.   On: 11/30/2022 14:50    Micro Results   Recent Results (from the past 240 hour(s))  Novel Coronavirus, NAA ([facility])     Status: None   Collection Time: 11/27/22 12:00 AM   Specimen: Nasopharyngeal(NP) swabs in vial transport medium   Nasopharynge  Previous  Result Value Ref Range Status   SARS-CoV-2, NAA Not Detected Not Detected Final    Comment: This nucleic acid amplification test was developed and its performance characteristics determined by [employer]. Nucleic  acid amplification tests include RT-PCR and TMA. This test has not been FDA cleared or approved. This test has been authorized by FDA under an Emergency Use Authorization (EUA). This test is only authorized for the duration of time the declaration that circumstances exist justifying the authorization of the emergency use of in vitro diagnostic tests for detection of SARS-CoV-2 virus and/or diagnosis of COVID-19 infection under section 564(b)(1) of the Act, 21 U.S.C. 161WRU-0(A) (1), unless the authorization is terminated or revoked sooner. When diagnostic testing is negative, the possibility of a false negative result should be considered in the context of a patient's recent exposures and the presence of clinical signs and symptoms consistent with COVID-19. An individual without symptoms of COVID-19 and who is not shedding SARS-CoV-2 virus wo uld expect to have a negative (not detected) result in this assay.   C Difficile Quick Screen w PCR reflex     Status: None   Collection Time: 12/02/22  8:43 AM   Specimen: STOOL  Result Value Ref Range Status   C Diff antigen NEGATIVE NEGATIVE Final   C Diff toxin NEGATIVE NEGATIVE Final   C Diff interpretation No C. difficile detected.  Final    Comment: Performed at [facility], [address], [place], [place] [address]   Today   Subjective    [name] today has no fevers or chills -Oral intake is much better -Frequency, consistency and volume of stools have improved   Patient has been seen and examined prior to discharge   Objective   Blood pressure (!) 169/52, pulse 67, temperature 98 F (36.7 C), temperature source  Oral, resp. rate 18, height 5\' 8"  (1.727 m), weight 76.5 kg, SpO2 98 %.   Intake/Output Summary (Last 24 hours) at 12/02/2022 1435 Last data filed at 12/02/2022 1100 Gross per 24 hour  Intake 2208.17 ml  Output 4 ml  Net 2204.17 ml   Exam Gen:- Awake Alert, no acute distress  HEENT:- [place].AT, No sclera  icterus Neck-Supple Neck,No JVD,.  Lungs-  CTAB , good air movement bilaterally CV- S1, S2 normal, regular Abd-  +ve B.Sounds, Abd Soft, No tenderness,    Extremity/Skin:- No  edema,   good pulses Psych-affect is appropriate, oriented x3 Neuro-no new focal deficits, no tremors    Data Review   CBC w Diff:  Lab Results  Component Value Date   WBC 6.5 12/01/2022   HGB 10.9 (L) 12/01/2022   HGB 11.2 03/26/2022   HCT 33.4 (L) 12/01/2022   HCT 33.7 (L) 03/26/2022   PLT 264 12/01/2022   PLT 291 03/26/2022   LYMPHOPCT 29 11/30/2022   MONOPCT 6 11/30/2022   EOSPCT 1 11/30/2022   BASOPCT 0 11/30/2022   CMP:  Lab Results  Component Value Date   NA 133 (L) 12/02/2022   NA 137 11/11/2022   K 4.1 12/02/2022   CL 101 12/02/2022   CO2 25 12/02/2022   BUN 11 12/02/2022   BUN 19 11/11/2022   CREATININE 0.77 12/02/2022   CREATININE 0.84 07/05/2014   PROT 5.7 (L) 12/01/2022   PROT 6.8 07/31/2021   ALBUMIN 3.1 (L) 12/01/2022   ALBUMIN 4.6 07/31/2021   BILITOT 0.9 12/01/2022   BILITOT 0.8 07/31/2021   ALKPHOS 72 12/01/2022   AST 13 (L) 12/01/2022   ALT 12 12/01/2022   Total Discharge time is about 33 minutes  Shon Hale M.D on 12/02/2022 at 2:35 PM  Go to [email or web address] -  for contact info  [facility] - Office  [phone]

## 2022-12-02 NOTE — TOC Transition Note (Signed)
Transition of Care [facility]) - CM/SW Discharge Note   Patient Details  Name: [name] MRN: [id number] Date of Birth: 20-Dec-1931  [facility] of [facility]) CM/SW Contact:  Annice Needy, LCSW Phone Number: 12/02/2022, 4:15 PM   Clinical Narrative:    D/c clinicals sent to facility. TOC signing off.    Final next level of care: Assisted Living Barriers to Discharge: No Barriers Identified   Patient Goals and CMS Choice      Discharge Placement                Patient chooses bed at: Other - please specify in the comment section below: ([facility]) Patient to be transferred to facility by: facility Name of family member notified: Lezlie Lye Patient and family notified of of transfer: 12/02/22  Discharge Plan and Services Additional resources added to the After Visit Summary for                                       Social Determinants of Health (SDOH) Interventions SDOH Screenings   Food Insecurity: No Food Insecurity (11/30/2022)  Housing: Patient Declined (11/30/2022)  Transportation Needs: No Transportation Needs (11/30/2022)  Utilities: Not At Risk (11/30/2022)  Alcohol Screen: Low Risk  (09/06/2022)  Depression (PHQ2-9): Low Risk  (09/06/2022)  Financial Resource Strain: Low Risk  (09/06/2022)  Physical Activity: Sufficiently Active (09/06/2022)  Social Connections: Moderately Isolated (09/06/2022)  Stress: No Stress Concern Present (09/06/2022)  Tobacco Use: Low Risk  (11/30/2022)     Readmission Risk Interventions     No data to display

## 2022-12-03 ENCOUNTER — Telehealth: Payer: Self-pay

## 2022-12-03 LAB — GASTROINTESTINAL PANEL BY PCR, STOOL (REPLACES STOOL CULTURE)

## 2022-12-03 NOTE — Transitions of Care (Post Inpatient/ED Visit) (Signed)
12/03/2022  Name: [name] MRN: [id number] DOB: 12/05/1931  Today's TOC FU Call Status: Today's TOC FU Call Status:: Successful TOC FU Call Competed TOC FU Call Complete Date: 12/03/22  Transition Care Management Follow-up Telephone Call Date of Discharge: 12/02/22 [facility] Type of Discharge: Inpatient Admission Primary Inpatient Discharge Diagnosis:: hypo-osmolality How have you been since you were released from the hospital?: Better Any questions or concerns?: No  Items Reviewed: Did you receive and understand the discharge instructions provided?: No Medications obtained,verified, and reconciled?: Yes (Medications Reviewed) Any new allergies since your discharge?: No Dietary orders reviewed?: NA Do you have support at home?: Yes Name of Support/Comfort Primary Source: ALF  Medications Reviewed Today: Medications Reviewed Today     Reviewed by Karena Addison, LPN (Licensed Practical Nurse) on 12/03/22 at 1054  Med List Status: <None>   Medication Order Taking? Sig Documenting Provider Last Dose Status Informant  acetaminophen (TYLENOL) 325 MG tablet [id number] Yes TAKE 2 TABLETS(650MG ) BY MOUTH EVERY 6 HOURS AS NEEDED FOR PAIN/TEMPERATURE.  Patient taking differently: Take 650 mg by mouth every 6 (six) hours as needed for moderate pain.   Babs Sciara, MD Taking Active Nursing Home Medication Administration Guide (MAG)  amLODipine (NORVASC) 10 MG tablet [id number] Yes Take 1 tablet (10 mg total) by mouth daily. Shon Hale, MD Taking Active   benzonatate (TESSALON) 100 MG capsule [id number] Yes TAKE (1) CAPSULE BY MOUTH 3 TIMES DAILY AS NEEDED FOR COUGH.  Patient taking differently: Take 100 mg by mouth 3 (three) times daily as needed for cough.   Babs Sciara, MD Taking Active Nursing Home Medication Administration Guide (MAG)  beta carotene w/minerals (OCUVITE) tablet [id number] Yes Take 1 tablet by mouth at bedtime.  Patient taking  differently: Take 1 tablet by mouth daily.   Babs Sciara, MD Taking Active Nursing Home Medication Administration Guide (MAG)  bisacodyl (DULCOLAX) 5 MG EC tablet [id number] Yes Take 1 tablet (5 mg total) by mouth daily as needed for moderate constipation. Cherie Dark, PA Taking Active Nursing Home Medication Administration Guide (MAG)  Carboxymethylcellulose Sodium (THERATEARS) 0.25 % SOLN [id number] Yes Apply 1 drop to eye in the morning and at bedtime. [provider] Taking Active Nursing Home Medication Administration Guide (MAG)  COMBIVENT RESPIMAT 20-100 MCG/ACT AERS respimat [id number] Yes INHALE (1) PUFF INTO THE LUNGS EVERY SIX HOURS AS NEEDED FOR WHEEZING OR SHORTNESS OF BREATH.  Patient taking differently: Inhale 1 puff into the lungs every 6 (six) hours as needed for wheezing or shortness of breath.   Babs Sciara, MD Taking Active Nursing Home Medication Administration Guide (MAG)  cyanocobalamin 1000 MCG tablet [id number] Yes Take 1,000 mcg by mouth at bedtime. [provider] Taking Active Nursing Home Medication Administration Guide (MAG)  dicyclomine (BENTYL) 10 MG capsule [id number] Yes TAKE 1 CAPSULE BY MOUTH TWICE DAILY AS NEEDED FOR ABDOMINAL CRAMPS AND LOOSE STOOL. Babs Sciara, MD Taking Active Nursing Home Medication Administration Guide (MAG)  EASYMAX TEST test strip [id number] Yes CHECK BLOOD SUGAR ONCE DAILY.(CALL MD IF BS BELOW 60: OR IF BS ABOVE 400) Luking, [name], MD Taking Active Nursing Home Medication Administration Guide (MAG)  gabapentin (NEURONTIN) 100 MG capsule [id number] Yes 2 po bid  Patient taking differently: Take 200 mg by mouth 2 (two) times daily.   Babs Sciara, MD Taking Active Nursing Home Medication Administration Guide (MAG)  GOODSENSE HEMORRHOIDAL 0.25-14-74.9 % rectal ointment [id number] Yes Place 1 Application rectally 3 (  three) times daily as needed for irritation. [provider] Taking Active Nursing Home  Medication Administration Guide (MAG)  loperamide (IMODIUM) 2 MG capsule [id number] Yes Take 1 capsule (2 mg total) by mouth as needed for diarrhea or loose stools. Shon Hale, MD Taking Active   loratadine (CLARITIN) 10 MG tablet [id number] Yes Take 10 mg by mouth daily. [provider] Taking Active Nursing Home Medication Administration Guide (MAG)  losartan (COZAAR) 100 MG tablet [id number] Yes Take 1 tablet daily Emokpae, Courage, MD Taking Active   Melatonin 3 MG SUBL [id number] Yes TAKE (1) TABLET BY MOUTH AT BEDTIME.  Patient taking differently: Take 1 tablet by mouth at bedtime.   Babs Sciara, MD Taking Active Nursing Home Medication Administration Guide (MAG)  metFORMIN (GLUCOPHAGE) 500 MG tablet [id number] Yes Take one tablet 500mg  po in the morning and 1/2 tablet 250 mg po at supper Shon Hale, MD Taking Active   Multiple Vitamins-Minerals (PRESERVISION AREDS 2) CAPS [id number] Yes Take 1 capsule by mouth in the morning and at bedtime. [provider] Taking Active Nursing Home Medication Administration Guide (MAG)  Omega-3 Fatty Acids (FISH OIL) 1000 MG CAPS [id number] Yes One capsule BID Babs Sciara, MD Taking Active Nursing Home Medication Administration Guide (MAG)  pantoprazole (PROTONIX) 40 MG tablet [id number] Yes Take 1 tablet (40 mg total) by mouth daily. Shon Hale, MD Taking Active   Vitamin D, Cholecalciferol, 10 MCG (400 UNIT) TABS [id number] Yes TAKE 1 TABLET BY MOUTH ONCE DAILY. Babs Sciara, MD Taking Active Nursing Home Medication Administration Guide [facility])  Med List Note Dianah Field 03/26/22 1884): [facility] [id number]            Home Care and Equipment/Supplies: Were Home Health Services Ordered?: NA Any new equipment or medical supplies ordered?: NA  Functional Questionnaire: Do you need assistance with bathing/showering or dressing?: Yes Do you need assistance with meal preparation?:  Yes Do you need assistance with eating?: No Do you have difficulty maintaining continence: No Do you need assistance with getting out of bed/getting out of a chair/moving?: No Do you have difficulty managing or taking your medications?: Yes  Follow up appointments reviewed: PCP Follow-up appointment confirmed?: Yes Date of PCP follow-up appointment?: 12/02/22 Follow-up Provider: [facility] Follow-up appointment confirmed?: NA Do you need transportation to your follow-up appointment?: No Do you understand care options if your condition(s) worsen?: Yes-patient verbalized understanding    SIGNATURE Karena Addison, LPN [facility] Nurse Health Advisor Direct Dial [phone]

## 2022-12-04 DIAGNOSIS — M6281 Muscle weakness (generalized): Secondary | ICD-10-CM | POA: Diagnosis not present

## 2022-12-05 DIAGNOSIS — M6281 Muscle weakness (generalized): Secondary | ICD-10-CM | POA: Diagnosis not present

## 2022-12-06 DIAGNOSIS — M6281 Muscle weakness (generalized): Secondary | ICD-10-CM | POA: Diagnosis not present

## 2022-12-06 DIAGNOSIS — R278 Other lack of coordination: Secondary | ICD-10-CM | POA: Diagnosis not present

## 2022-12-09 DIAGNOSIS — R278 Other lack of coordination: Secondary | ICD-10-CM | POA: Diagnosis not present

## 2022-12-09 DIAGNOSIS — M6281 Muscle weakness (generalized): Secondary | ICD-10-CM | POA: Diagnosis not present

## 2022-12-11 ENCOUNTER — Ambulatory Visit (INDEPENDENT_AMBULATORY_CARE_PROVIDER_SITE_OTHER): Payer: Medicare Other | Admitting: Family Medicine

## 2022-12-11 ENCOUNTER — Encounter: Payer: Self-pay | Admitting: Family Medicine

## 2022-12-11 VITALS — BP 134/74 | HR 88 | Ht 68.0 in | Wt 165.8 lb

## 2022-12-11 DIAGNOSIS — D649 Anemia, unspecified: Secondary | ICD-10-CM

## 2022-12-11 DIAGNOSIS — E871 Hypo-osmolality and hyponatremia: Secondary | ICD-10-CM

## 2022-12-11 DIAGNOSIS — E876 Hypokalemia: Secondary | ICD-10-CM | POA: Diagnosis not present

## 2022-12-11 DIAGNOSIS — R197 Diarrhea, unspecified: Secondary | ICD-10-CM | POA: Diagnosis not present

## 2022-12-11 DIAGNOSIS — G4709 Other insomnia: Secondary | ICD-10-CM | POA: Diagnosis not present

## 2022-12-11 DIAGNOSIS — F439 Reaction to severe stress, unspecified: Secondary | ICD-10-CM

## 2022-12-11 MED ORDER — ALPRAZOLAM 0.25 MG PO TABS
ORAL_TABLET | ORAL | 0 refills | Status: DC
Start: 2022-12-11 — End: 2023-02-25

## 2022-12-11 NOTE — Progress Notes (Signed)
   Subjective:    Patient ID: [name], female    DOB: 06/30/1932, 89 y.o.   MRN: [id number]  HPI Patient arrives today for hospital follow up. Patient states no concerns or issues.  Patient here for hospital follow-up She was in the hospital with diarrhea low potassium low calcium, anemia.  Denied any rectal bleeding.  Denies high fever chills sweats Denies any wheezing or difficulty breathing.  Energy level subpar She now states she is feeling better more energy eating well drinking well tolerating things well  Review of Systems     Objective:   Physical Exam General-in no acute distress Eyes-no discharge Lungs-respiratory rate normal, CTA CV-no murmurs,RRR Extremities skin warm dry no edema Neuro grossly normal Behavior normal, alert Abdomen soft       Assessment & Plan:  1. Diarrhea, unspecified type Resolved no sign of C. difficile try to avoid antibiotics  2. Hypokalemia Metabolic 7 repeat to look at potassium and magnesium - Basic Metabolic Panel (7) - Magnesium - CBC with Differential  3. Hypocalcemia Recheck calcium with a metabolic 7 healthy diet recommended - Basic Metabolic Panel (7) - Magnesium - CBC with Differential  4. Normochromic anemia Follow-up on CBC to see if this is a trend if so may need further testing - Basic Metabolic Panel (7) - Magnesium - CBC with Differential  Patient back on regular diet  Patient under a lot of stress and grief related to her daughter-in-law having lung cancer because of this she is not sleeping well and is requesting a low-dose medication to help temporarily we will use Xanax only when necessary at nighttime to help with rest patient to follow-up closely She will do a follow-up within the next 3 months follow-up sooner if any problems

## 2022-12-12 ENCOUNTER — Encounter: Payer: Self-pay | Admitting: Family Medicine

## 2022-12-12 LAB — CBC WITH DIFFERENTIAL/PLATELET
Basophils Absolute: 0 10*3/uL (ref 0.0–0.2)
Basos: 0 %
EOS (ABSOLUTE): 0.2 10*3/uL (ref 0.0–0.4)
Eos: 2 %
Hematocrit: 40 % (ref 34.0–46.6)
Hemoglobin: 13 g/dL (ref 11.1–15.9)
Immature Grans (Abs): 0 10*3/uL (ref 0.0–0.1)
Immature Granulocytes: 0 %
Lymphocytes Absolute: 3 10*3/uL (ref 0.7–3.1)
Lymphs: 29 %
MCH: 27.8 pg (ref 26.6–33.0)
MCHC: 32.5 g/dL (ref 31.5–35.7)
MCV: 86 fL (ref 79–97)
Monocytes Absolute: 0.7 10*3/uL (ref 0.1–0.9)
Monocytes: 7 %
Neutrophils Absolute: 6.3 10*3/uL (ref 1.4–7.0)
Neutrophils: 62 %
Platelets: 352 10*3/uL (ref 150–450)
RBC: 4.68 x10E6/uL (ref 3.77–5.28)
RDW: 13.7 % (ref 11.7–15.4)
WBC: 10.3 10*3/uL (ref 3.4–10.8)

## 2022-12-12 LAB — BASIC METABOLIC PANEL (7)
BUN/Creatinine Ratio: 19 (ref 12–28)
BUN: 14 mg/dL (ref 8–27)
CO2: 24 mmol/L (ref 20–29)
Chloride: 96 mmol/L (ref 96–106)
Creatinine, Ser: 0.74 mg/dL (ref 0.57–1.00)
Glucose: 112 mg/dL — ABNORMAL HIGH (ref 70–99)
Potassium: 4.7 mmol/L (ref 3.5–5.2)
Sodium: 138 mmol/L (ref 134–144)
eGFR: 77 mL/min/{1.73_m2} (ref >59)

## 2022-12-12 LAB — MAGNESIUM: Magnesium: 1.6 mg/dL (ref 1.6–2.3)

## 2022-12-12 NOTE — Progress Notes (Signed)
Please mail to the patient 

## 2022-12-13 DIAGNOSIS — R278 Other lack of coordination: Secondary | ICD-10-CM | POA: Diagnosis not present

## 2022-12-13 DIAGNOSIS — M6281 Muscle weakness (generalized): Secondary | ICD-10-CM | POA: Diagnosis not present

## 2022-12-16 DIAGNOSIS — M6281 Muscle weakness (generalized): Secondary | ICD-10-CM | POA: Diagnosis not present

## 2022-12-16 DIAGNOSIS — R278 Other lack of coordination: Secondary | ICD-10-CM | POA: Diagnosis not present

## 2022-12-18 DIAGNOSIS — R278 Other lack of coordination: Secondary | ICD-10-CM | POA: Diagnosis not present

## 2022-12-18 DIAGNOSIS — M6281 Muscle weakness (generalized): Secondary | ICD-10-CM | POA: Diagnosis not present

## 2022-12-20 ENCOUNTER — Other Ambulatory Visit: Payer: Self-pay | Admitting: Family Medicine

## 2022-12-20 DIAGNOSIS — R278 Other lack of coordination: Secondary | ICD-10-CM | POA: Diagnosis not present

## 2022-12-20 DIAGNOSIS — M6281 Muscle weakness (generalized): Secondary | ICD-10-CM | POA: Diagnosis not present

## 2022-12-23 DIAGNOSIS — R278 Other lack of coordination: Secondary | ICD-10-CM | POA: Diagnosis not present

## 2022-12-23 DIAGNOSIS — M6281 Muscle weakness (generalized): Secondary | ICD-10-CM | POA: Diagnosis not present

## 2022-12-24 DIAGNOSIS — M6281 Muscle weakness (generalized): Secondary | ICD-10-CM | POA: Diagnosis not present

## 2022-12-26 ENCOUNTER — Other Ambulatory Visit: Payer: Self-pay | Admitting: Family Medicine

## 2022-12-26 DIAGNOSIS — M6281 Muscle weakness (generalized): Secondary | ICD-10-CM | POA: Diagnosis not present

## 2022-12-30 DIAGNOSIS — M6281 Muscle weakness (generalized): Secondary | ICD-10-CM | POA: Diagnosis not present

## 2022-12-30 DIAGNOSIS — R278 Other lack of coordination: Secondary | ICD-10-CM | POA: Diagnosis not present

## 2022-12-31 DIAGNOSIS — M6281 Muscle weakness (generalized): Secondary | ICD-10-CM | POA: Diagnosis not present

## 2023-01-01 DIAGNOSIS — M6281 Muscle weakness (generalized): Secondary | ICD-10-CM | POA: Diagnosis not present

## 2023-01-01 DIAGNOSIS — R278 Other lack of coordination: Secondary | ICD-10-CM | POA: Diagnosis not present

## 2023-01-06 DIAGNOSIS — R278 Other lack of coordination: Secondary | ICD-10-CM | POA: Diagnosis not present

## 2023-01-06 DIAGNOSIS — M6281 Muscle weakness (generalized): Secondary | ICD-10-CM | POA: Diagnosis not present

## 2023-01-07 DIAGNOSIS — M6281 Muscle weakness (generalized): Secondary | ICD-10-CM | POA: Diagnosis not present

## 2023-01-08 ENCOUNTER — Ambulatory Visit (HOSPITAL_COMMUNITY)
Admission: RE | Admit: 2023-01-08 | Discharge: 2023-01-08 | Disposition: A | Discharge status: Home / Self Care | Payer: Medicare Other | Source: Ambulatory Visit | Attending: Family Medicine | Admitting: Family Medicine

## 2023-01-08 ENCOUNTER — Telehealth: Payer: Self-pay

## 2023-01-08 ENCOUNTER — Telehealth: Payer: Self-pay | Admitting: Family Medicine

## 2023-01-08 ENCOUNTER — Ambulatory Visit (INDEPENDENT_AMBULATORY_CARE_PROVIDER_SITE_OTHER): Payer: Medicare Other | Admitting: Family Medicine

## 2023-01-08 VITALS — BP 161/71 | HR 100 | Wt 165.4 lb

## 2023-01-08 DIAGNOSIS — S060XAA Concussion with loss of consciousness status unknown, initial encounter: Secondary | ICD-10-CM

## 2023-01-08 DIAGNOSIS — W19XXXA Unspecified fall, initial encounter: Secondary | ICD-10-CM

## 2023-01-08 DIAGNOSIS — H1131 Conjunctival hemorrhage, right eye: Secondary | ICD-10-CM

## 2023-01-08 DIAGNOSIS — S0011XA Contusion of right eyelid and periocular area, initial encounter: Secondary | ICD-10-CM

## 2023-01-08 DIAGNOSIS — S0990XA Unspecified injury of head, initial encounter: Secondary | ICD-10-CM | POA: Diagnosis not present

## 2023-01-08 NOTE — Telephone Encounter (Signed)
Patient had fall We saw her today Her CAT scan did not show any hemorrhage within the brain [facility] is aware  Front-please help set up follow-up in 2 to 3 weeks thank you

## 2023-01-08 NOTE — Telephone Encounter (Signed)
Called Dr Genevie Cheshire office [phone] left message for office to call [facility].  (NURSE NOTE* Dr Lorin Picket would like an appointment scheduled for this week if possible for [name] for a fall. Patient has global sclerae hemorrhage. Fax: [phone])

## 2023-01-08 NOTE — Telephone Encounter (Signed)
Called patient and left message for patient to call office. [(Nurse Note* see CT Head Results) Sherie Don NP reviewed results of CT informed to call patient results. Follow up appointment in 2 weeks with Dr Scott]

## 2023-01-08 NOTE — Telephone Encounter (Signed)
Thank you-I did call [facility] and let them know the results of the tests-I assume they shared it with the patient-also please set up patient for a follow-up visit 2 weeks thanks

## 2023-01-08 NOTE — Progress Notes (Signed)
   Subjective:    Patient ID: [name], female    DOB: April 11, 1932, 89 y.o.   MRN: [id number]  HPI Patient fell and busted blood vessel in right eye.   She states she got up this morning she fell struck the right side of her head cause bleeding in the eyeball itself slight blurred vision she states she had a headache afterwards with nausea but no vomiting no unilateral or focal symptoms.  She states recently her balance has not been as good as it normally is but she uses her walker as best she can she fell at the rest home she denies any other trauma or injury.   Review of Systems     Objective:   Physical Exam  Patient has a scleral hemorrhage in the right eye.  The pupil seems to be reactive I do not see any signs of bleeding within the pupil.  She states her vision is not as sharp on the right side as it normally is  No unilateral numbness or weakness EOMI Scleral hemorrhage noted Soreness and pain and discomfort along the right side of her head temporal/eye socket region     Assessment & Plan:  Mild concussion Scleral hemorrhage Ataxia Stat scan ordered Referral to optometry to visualize her retina make sure there is no detachment or hemorrhage Tylenol for pain Recheck 2 weeks If subdural hematoma then ER

## 2023-01-09 NOTE — Telephone Encounter (Signed)
thank you-I did call [facility] and let them know the results of the tests-I assume they shared it with the patient-also please set up patient for a follow-up visit 2 weeks thanks   Patient made aware per message above

## 2023-01-10 NOTE — Telephone Encounter (Signed)
Called and left messaged for Dr Jinny Sanders office call office back. (NURSE NOTE- need to schedule appointment for patient for next week for [facility]. Patient has global sclerae hemorrhage. [phone] Fax [phone])

## 2023-01-15 ENCOUNTER — Other Ambulatory Visit: Payer: Self-pay | Admitting: Family Medicine

## 2023-01-20 ENCOUNTER — Telehealth: Payer: Self-pay

## 2023-01-20 NOTE — Telephone Encounter (Signed)
Called and spoke with facility and left a message to inform of eye doctor appt 01/22/23 at 10:00, they stated they will have to call and reschedule it for her due to the large amount of appts on that day.

## 2023-01-24 ENCOUNTER — Other Ambulatory Visit: Payer: Self-pay | Admitting: Family Medicine

## 2023-01-24 ENCOUNTER — Telehealth: Payer: Self-pay | Admitting: Family Medicine

## 2023-01-24 MED ORDER — MECLIZINE HCL 25 MG PO TABS: 25.0000 mg | ORAL_TABLET | Freq: Three times a day (TID) | ORAL | 0 refills | Status: DC | PRN

## 2023-01-24 NOTE — Telephone Encounter (Signed)
Prescription was sent Use sparingly caution drowsiness was placed on the prescription Patient has upcoming appointment in a few days we will recheck blood pressure at that time we will not make adjustments in her blood pressure medicine currently

## 2023-01-24 NOTE — Telephone Encounter (Signed)
Spoke with [facility] staff and made aware per provider recomemndations

## 2023-01-24 NOTE — Telephone Encounter (Signed)
[name] from [facility] called and states patient had Meclizine PRN on her list; however it was discontinued from her hospital stay. Patient is complaining of  being dizzy and she feels it is coming from her vertigo. [name] did take patients b/p and it was 180/76 and a pulse of 78. Patient uses [facility] if you can call that in for her.  CB# [phone]

## 2023-01-24 NOTE — Telephone Encounter (Signed)
[name] from [facility] called and states patient had Meclizine PRN on her list; however it was discontinued from her hospital stay. Patient is complaining of being dizzy and she feels it is coming from her vertigo. [name] did take patients b/p and it was 180/76 and a pulse of 78. Patient uses [facility] if you can call that in for her

## 2023-01-25 ENCOUNTER — Encounter (HOSPITAL_COMMUNITY): Payer: Self-pay

## 2023-01-25 ENCOUNTER — Other Ambulatory Visit: Payer: Self-pay

## 2023-01-25 ENCOUNTER — Emergency Department (HOSPITAL_COMMUNITY): Payer: Medicare Other

## 2023-01-25 ENCOUNTER — Emergency Department (HOSPITAL_COMMUNITY)
Admission: EM | Admit: 2023-01-25 | Discharge: 2023-01-25 | Disposition: A | Discharge status: Home / Self Care | Payer: Medicare Other | Attending: Emergency Medicine | Admitting: Emergency Medicine

## 2023-01-25 DIAGNOSIS — E871 Hypo-osmolality and hyponatremia: Secondary | ICD-10-CM | POA: Diagnosis not present

## 2023-01-25 DIAGNOSIS — I491 Atrial premature depolarization: Secondary | ICD-10-CM | POA: Diagnosis not present

## 2023-01-25 DIAGNOSIS — N3000 Acute cystitis without hematuria: Secondary | ICD-10-CM | POA: Diagnosis not present

## 2023-01-25 DIAGNOSIS — Z79899 Other long term (current) drug therapy: Secondary | ICD-10-CM | POA: Diagnosis not present

## 2023-01-25 DIAGNOSIS — I10 Essential (primary) hypertension: Secondary | ICD-10-CM | POA: Diagnosis not present

## 2023-01-25 DIAGNOSIS — I517 Cardiomegaly: Secondary | ICD-10-CM | POA: Diagnosis not present

## 2023-01-25 DIAGNOSIS — R197 Diarrhea, unspecified: Secondary | ICD-10-CM | POA: Diagnosis not present

## 2023-01-25 DIAGNOSIS — I499 Cardiac arrhythmia, unspecified: Secondary | ICD-10-CM | POA: Diagnosis not present

## 2023-01-25 DIAGNOSIS — R5383 Other fatigue: Secondary | ICD-10-CM | POA: Diagnosis present

## 2023-01-25 DIAGNOSIS — R531 Weakness: Secondary | ICD-10-CM | POA: Diagnosis not present

## 2023-01-25 LAB — COMPREHENSIVE METABOLIC PANEL
ALT: 15 U/L (ref 0–44)
AST: 16 U/L (ref 15–41)
Albumin: 3.8 g/dL (ref 3.5–5.0)
Alkaline Phosphatase: 85 U/L (ref 38–126)
Anion gap: 11 (ref 5–15)
BUN: 18 mg/dL (ref 8–23)
CO2: 23 mmol/L (ref 22–32)
Calcium: 9 mg/dL (ref 8.9–10.3)
Chloride: 98 mmol/L (ref 98–111)
Creatinine, Ser: 0.74 mg/dL (ref 0.44–1.00)
GFR, Estimated: >60 mL/min (ref >60)
Glucose, Bld: 131 mg/dL — ABNORMAL HIGH (ref 70–99)
Potassium: 4.8 mmol/L (ref 3.5–5.1)
Sodium: 132 mmol/L — ABNORMAL LOW (ref 135–145)
Total Bilirubin: 0.8 mg/dL (ref 0.3–1.2)
Total Protein: 6.3 g/dL — ABNORMAL LOW (ref 6.5–8.1)

## 2023-01-25 LAB — CBC WITH DIFFERENTIAL/PLATELET
Abs Immature Granulocytes: 0.03 10*3/uL (ref 0.00–0.07)
Basophils Absolute: 0 10*3/uL (ref 0.0–0.1)
Basophils Relative: 0 %
Eosinophils Absolute: 0.1 10*3/uL (ref 0.0–0.5)
Eosinophils Relative: 2 %
HCT: 35.8 % — ABNORMAL LOW (ref 36.0–46.0)
Hemoglobin: 11.8 g/dL — ABNORMAL LOW (ref 12.0–15.0)
Immature Granulocytes: 0 %
Lymphocytes Relative: 26 %
Lymphs Abs: 2.2 10*3/uL (ref 0.7–4.0)
MCH: 28.9 pg (ref 26.0–34.0)
MCHC: 33 g/dL (ref 30.0–36.0)
MCV: 87.7 fL (ref 80.0–100.0)
Monocytes Absolute: 0.6 10*3/uL (ref 0.1–1.0)
Monocytes Relative: 7 %
Neutro Abs: 5.6 10*3/uL (ref 1.7–7.7)
Neutrophils Relative %: 65 %
Platelets: 264 10*3/uL (ref 150–400)
RBC: 4.08 MIL/uL (ref 3.87–5.11)
RDW: 13.9 % (ref 11.5–15.5)
WBC: 8.7 10*3/uL (ref 4.0–10.5)
nRBC: 0 % (ref 0.0–0.2)

## 2023-01-25 LAB — URINALYSIS, ROUTINE W REFLEX MICROSCOPIC
Bilirubin Urine: NEGATIVE
Glucose, UA: NEGATIVE mg/dL
Hgb urine dipstick: NEGATIVE
Ketones, ur: NEGATIVE mg/dL
Nitrite: NEGATIVE
Protein, ur: 30 mg/dL — AB
Specific Gravity, Urine: 1.006 (ref 1.005–1.030)
WBC, UA: >50 WBC/hpf (ref 0–5)
pH: 7 (ref 5.0–8.0)

## 2023-01-25 LAB — MAGNESIUM: Magnesium: 1.7 mg/dL (ref 1.7–2.4)

## 2023-01-25 LAB — TSH: TSH: 1.706 u[IU]/mL (ref 0.350–4.500)

## 2023-01-25 MED ORDER — SODIUM CHLORIDE 0.9 % IV SOLN
1.0000 g | Freq: Once | INTRAVENOUS | Status: AC
  Administered 2023-01-25: 1 g via INTRAVENOUS
  Filled 2023-01-25: qty 10

## 2023-01-25 MED ORDER — SODIUM CHLORIDE 0.9 % IV BOLUS
500.0000 mL | Freq: Once | INTRAVENOUS | Status: AC
  Administered 2023-01-25: 500 mL via INTRAVENOUS

## 2023-01-25 MED ORDER — CEPHALEXIN 500 MG PO CAPS: 500.0000 mg | ORAL_CAPSULE | Freq: Three times a day (TID) | ORAL | 0 refills | Status: AC

## 2023-01-25 NOTE — ED Triage Notes (Signed)
Pt presents to ED via EMS from [facility] with c/o generalized weakness, CBG was WNL, no temp, no vomiting, pt has had diarrhea  and dizziness for 3 days and saw her Dr and was prescribed meclizine for  dizziness and imodium for diarrhea, pt has taken two today, last taken at noon, one loose stool since then . Pt says she has drank 4 glasses of water and urinated recently per report.

## 2023-01-25 NOTE — ED Notes (Signed)
Spoke with [facility] informed staff EDP discuss discharge planning. Staff made aware, informed nurse facility will arrange transportation.

## 2023-01-25 NOTE — Discharge Instructions (Signed)
Your testing is revealed a urinary tract infection, no other acute findings.  I do want you to take the cephalexin 3 times a day for 7 days.  I also want you to take a probiotic to help prevent recurrent diarrhea.  You will need to see your doctor within 3 days for recheck if you are not any better but come back to the ER immediately for severe or worsening symptoms, please make sure you are drinking plenty of clear liquids

## 2023-01-25 NOTE — ED Provider Notes (Signed)
[employer] EMERGENCY DEPARTMENT AT [facility] Provider Note   CSN: [id number] Arrival date & time: 01/25/23  1626     History  Chief Complaint  Patient presents with   generalized weakness   Diarrhea    [name] is a 89 y.o. female.   Diarrhea  This patient is a 89 year old female, she has a medical history significant for hypertension on amlodipine, losartan as well as on gabapentin and metformin, she reports that she has had 3 days of progressive lethargic and fatigued feeling, paramedics were called and found the patient to have unremarkable vital signs except for some hypertension, she was generally weak but nonfocal, no altered mental status, no fevers, no tachycardia.  They did notice some PVCs on the monitor.  She reports that she has had some very loose stools for the last couple of days, she has had 3 of them today, she has no feeling weak in her legs like she does not have any energy and is concerned that she may have some recurrent hyponatremia as she was admitted for that back in June.  She has no urinary symptoms, no fevers or chills, no coughing or shortness of breath, no chest pain no headache no blurred vision.  She did start taking some meclizine yesterday at the suggestion of her doctor when she called the office complaining of some general dizziness with standing    Home Medications Prior to Admission medications   Medication Sig Start Date End Date Taking? Authorizing Provider  cephALEXin (KEFLEX) 500 MG capsule Take 1 capsule (500 mg total) by mouth 3 (three) times daily for 7 days. 01/25/23 02/01/23 Yes Eber Hong, MD  meclizine (ANTIVERT) 25 MG tablet Take 1 tablet (25 mg total) by mouth 3 (three) times daily as needed for dizziness or nausea. 01/24/23   Babs Sciara, MD  acetaminophen (PAIN RELIEF EXTRA STRENGTH) 500 MG tablet TAKE (2) TABLETS BY MOUTH EVERY SIX HOURS A NEEDED FOR PAIN. 01/15/23   Babs Sciara, MD  acetaminophen (TYLENOL)  325 MG tablet TAKE 2 TABLETS(650MG ) BY MOUTH EVERY 6 HOURS AS NEEDED FOR PAIN/TEMPERATURE. Patient taking differently: Take 650 mg by mouth every 6 (six) hours as needed for moderate pain. 08/20/22   Babs Sciara, MD  ALPRAZolam Prudy Feeler) 0.25 MG tablet 1 po qhs prn 12/11/22   Babs Sciara, MD  amLODipine (NORVASC) 10 MG tablet Take 1 tablet (10 mg total) by mouth daily. 12/02/22   Emokpae, Courage, MD  benzonatate (TESSALON) 100 MG capsule TAKE (1) CAPSULE BY MOUTH 3 TIMES DAILY AS NEEDED FOR COUGH. Patient taking differently: Take 100 mg by mouth 3 (three) times daily as needed for cough. 11/21/22   Babs Sciara, MD  beta carotene w/minerals (OCUVITE) tablet Take 1 tablet by mouth at bedtime. Patient taking differently: Take 1 tablet by mouth daily. 07/03/22   Babs Sciara, MD  BISACODYL 5 MG EC tablet TAKE 1 TABLET BY MOUTH ONCE DAILY AS NEEDED FOR MODERATE CONSTIPATION. 12/20/22   Babs Sciara, MD  Carboxymethylcellulose Sodium (THERATEARS) 0.25 % SOLN Apply 1 drop to eye in the morning and at bedtime.    [provider]  COMBIVENT RESPIMAT 20-100 MCG/ACT AERS respimat INHALE (1) PUFF INTO THE LUNGS EVERY SIX HOURS AS NEEDED FOR WHEEZING OR SHORTNESS OF BREATH. Patient taking differently: Inhale 1 puff into the lungs every 6 (six) hours as needed for wheezing or shortness of breath. 11/21/22   Babs Sciara, MD  cyanocobalamin  1000 MCG tablet Take 1,000 mcg by mouth at bedtime.    [provider]  dicyclomine (BENTYL) 10 MG capsule TAKE 1 CAPSULE BY MOUTH TWICE DAILY AS NEEDED FOR ABDOMINAL CRAMPS AND LOOSE STOOL. 11/21/22   Luking, [name], MD  EASYMAX TEST test strip CHECK BLOOD SUGAR ONCE DAILY.(CALL MD IF BS BELOW 60: OR IF BS ABOVE 400) 10/28/22   Luking, [name], MD  gabapentin (NEURONTIN) 100 MG capsule 2 po bid Patient taking differently: Take 200 mg by mouth 2 (two) times daily. 11/19/22   Babs Sciara, MD  GOODSENSE HEMORRHOIDAL 0.25-14-74.9 % rectal ointment  Place 1 Application rectally 3 (three) times daily as needed for irritation. 12/26/21   [provider]  loperamide (ANTI-DIARRHEAL) 2 MG tablet TAKE 1 TABLET BY MOUTH AS NEEDED FOR DIARRHEA OR LOOSE STOOL**MAX 4 TABLETS IN 24 HOURS** 12/27/22   Luking, Scott A, MD  loratadine (CLARITIN) 10 MG tablet Take 10 mg by mouth daily.    [provider]  losartan (COZAAR) 100 MG tablet Take 1 tablet daily 12/02/22   Shon Hale, MD  Melatonin 3 MG SUBL TAKE (1) TABLET BY MOUTH AT BEDTIME. Patient taking differently: Take 1 tablet by mouth at bedtime. 05/30/22   Babs Sciara, MD  metFORMIN (GLUCOPHAGE) 500 MG tablet Take one tablet 500mg  po in the morning and 1/2 tablet 250 mg po at supper 12/02/22   Shon Hale, MD  Multiple Vitamins-Minerals (PRESERVISION AREDS 2) CAPS Take 1 capsule by mouth in the morning and at bedtime.    [provider]  Omega-3 Fatty Acids (FISH OIL) 1000 MG CAPS One capsule BID 08/07/21   Luking, Scott A, MD  pantoprazole (PROTONIX) 40 MG tablet Take 1 tablet (40 mg total) by mouth daily. 12/03/22   Shon Hale, MD  Vitamin D, Cholecalciferol, 10 MCG (400 UNIT) TABS TAKE 1 TABLET BY MOUTH ONCE DAILY. 05/12/20   Babs Sciara, MD      Allergies    Hydrocortisone, Lisinopril, Phenergan [promethazine hcl], Statins, Trazodone and nefazodone, and Prednisone    Review of Systems   Review of Systems  Gastrointestinal:  Positive for diarrhea.  All other systems reviewed and are negative.   Physical Exam Updated Vital Signs BP (!) 180/62   Pulse 67   Temp 98.7 F (37.1 C) (Oral)   Resp 20   Ht 1.727 m (5\' 8" )   Wt 75 kg   SpO2 97%   BMI 25.14 kg/m  Physical Exam Vitals and nursing note reviewed.  Constitutional:      General: She is not in acute distress.    Appearance: She is well-developed.  HENT:     Head: Normocephalic and atraumatic.     Mouth/Throat:     Pharynx: No oropharyngeal exudate.  Eyes:     General: No scleral  icterus.       Right eye: No discharge.        Left eye: No discharge.     Conjunctiva/sclera: Conjunctivae normal.     Pupils: Pupils are equal, round, and reactive to light.  Neck:     Thyroid: No thyromegaly.     Vascular: No JVD.  Cardiovascular:     Rate and Rhythm: Normal rate and regular rhythm.     Heart sounds: Normal heart sounds. No murmur heard.    No friction rub. No gallop.  Pulmonary:     Effort: Pulmonary effort is normal. No respiratory distress.     Breath sounds: Normal  breath sounds. No wheezing or rales.  Abdominal:     General: Bowel sounds are normal. There is no distension.     Palpations: Abdomen is soft. There is no mass.     Tenderness: There is no abdominal tenderness.  Musculoskeletal:        General: No tenderness. Normal range of motion.     Cervical back: Normal range of motion and neck supple.  Lymphadenopathy:     Cervical: No cervical adenopathy.  Skin:    General: Skin is warm and dry.     Findings: No erythema or rash.  Neurological:     Mental Status: She is alert.     Coordination: Coordination normal.     Comments: Speech is clear, cranial nerves III through XII are intact, memory is intact, strength is normal in all 4 extremities including grips, sensation is intact to light touch and pinprick in all 4 extremities. Coordination as tested by finger-nose-finger is normal, no limb ataxia.  Normal level of alertness and answers my questions appropriately  Psychiatric:        Behavior: Behavior normal.     ED Results / Procedures / Treatments   Labs (all labs ordered are listed, but only abnormal results are displayed) Labs Reviewed  CBC WITH DIFFERENTIAL/PLATELET - Abnormal; Notable for the following components:      Result Value   Hemoglobin 11.8 (*)    HCT 35.8 (*)    All other components within normal limits  COMPREHENSIVE METABOLIC PANEL - Abnormal; Notable for the following components:   Sodium 132 (*)    Glucose, Bld 131 (*)     Total Protein 6.3 (*)    All other components within normal limits  URINALYSIS, ROUTINE W REFLEX MICROSCOPIC - Abnormal; Notable for the following components:   Color, Urine STRAW (*)    APPearance HAZY (*)    Protein, ur 30 (*)    Leukocytes,Ua MODERATE (*)    Bacteria, UA RARE (*)    All other components within normal limits  MAGNESIUM  TSH    EKG EKG Interpretation Date/Time:  Saturday January 25 2023 16:38:40 EDT Ventricular Rate:  85 PR Interval:  210 QRS Duration:  124 QT Interval:  378 QTC Calculation: 450 R Axis:   41  Text Interpretation: Sinus rhythm Multiple ventricular premature complexes Nonspecific intraventricular conduction delay Anterior infarct, old Confirmed by Eber Hong ([id number]) on 01/25/2023 5:34:57 PM  Radiology DG Chest Port 1 View  Result Date: 01/25/2023 CLINICAL DATA:  Generalized weakness EXAM: PORTABLE CHEST 1 VIEW COMPARISON:  11/30/2022 FINDINGS: Transverse diameter of heart is increased. Central pulmonary vessels are prominent without signs of pulmonary edema. There is no focal pulmonary consolidation. Low position of diaphragms may suggest COPD. There is no pleural effusion or pneumothorax. IMPRESSION: Cardiomegaly. Central pulmonary vessels are prominent without signs of pulmonary edema. There is no focal pulmonary consolidation. Electronically Signed   By: Ernie Avena M.D.   On: 01/25/2023 17:07    Procedures Procedures    Medications Ordered in ED Medications  cefTRIAXone (ROCEPHIN) 1 g in sodium chloride 0.9 % 100 mL IVPB (1 g Intravenous New Bag/Given 01/25/23 1834)  sodium chloride 0.9 % bolus 500 mL (0 mLs Intravenous Stopped 01/25/23 1742)    ED Course/ Medical Decision Making/ A&P                             Medical Decision Making Amount and/or Complexity of  Data Reviewed Labs: ordered. Radiology: ordered. ECG/medicine tests: ordered.  Risk Prescription drug management.    This patient presents to the ED for  concern of generalized weakness, this involves an extensive number of treatment options, and is a complaint that carries with it a high risk of complications and morbidity.  The differential diagnosis includes slight abnormalities in fact the patient reports that when this has happened in the past she felt like she had a really low sodium, she has had several days of diarrhea raising concern for similar.  She has been treated by her family doctor with Imodium, meclizine because of some dizziness yesterday, she is not dizzy today, no chest pain   Co morbidities that complicate the patient evaluation  Recurrent diarrhea and electrolyte abnormalities   Additional history obtained:  Additional history obtained from medical record External records from outside source obtained and reviewed including recent admission for hyponatremia June 1, her level was only 125, chloride was low at 87, magnesium was low at 1.5   Lab Tests:  I Ordered, and personally interpreted labs.  The pertinent results include: Remarkable CBC and metabolic panel, minimal hyponatremia, urinary sample taken and shows likely UTI, culture will be sent, TSH normal   Imaging Studies ordered:  I ordered imaging studies including chest x-ray I independently visualized and interpreted imaging which showed no acute findings I agree with the radiologist interpretation   Cardiac Monitoring: / EKG:  The patient was maintained on a cardiac monitor.  I personally viewed and interpreted the cardiac monitored which showed an underlying rhythm of: Sinus rhythm    Problem List / ED Course / Critical interventions / Medication management  Patient informed of her urinary tract infection, Rocephin given I ordered medication including Rocephin for UTI Reevaluation of the patient after these medicines showed that the patient improved I have reviewed the patients home medicines and have made adjustments as needed   Social Determinants  of Health:  None   Test / Admission - Considered:  Patient given all of her results including the urinary sample and the indication for treatment and return, she is agreeable, she feels comfortable going home, she has no abnormal vital signs other than mild hypertension but she does not appear unstable whatsoever.  She is able to answer all the questions and has had all of her questions answered as well.  She is comfortable with the plan of discharge on cephalexin, and a probiotic to help prevent C. difficile         Final Clinical Impression(s) / ED Diagnoses Final diagnoses:  Acute cystitis without hematuria  Generalized weakness    Rx / DC Orders ED Discharge Orders          Ordered    cephALEXin (KEFLEX) 500 MG capsule  3 times daily        01/25/23 1858              Eber Hong, MD 01/25/23 1900

## 2023-01-27 ENCOUNTER — Telehealth: Payer: Self-pay

## 2023-01-27 NOTE — Telephone Encounter (Signed)
[facility] called and want to let Dr Lorin Picket know that Orson Slick went to the ER hasa UTI they put er on antibiotics. Dr Lorin Picket has appt with her this Wed

## 2023-01-27 NOTE — Telephone Encounter (Signed)
So noted thank you 

## 2023-01-27 NOTE — Transitions of Care (Post Inpatient/ED Visit) (Signed)
01/27/2023  Name: [name] MRN: [id number] DOB: 22-Nov-1931  Today's TOC FU Call Status: Today's TOC FU Call Status:: Unsuccessul Call (1st Attempt) TOC FU Call Complete Date: 01/27/23  Transition Care Management Follow-up Telephone Call Date of Discharge: 01/25/23 Discharge [facility] Type of Discharge: Emergency Department Primary Inpatient Discharge Diagnosis:: UTI How have you been since you were released from the hospital?: Better Any questions or concerns?: No  Items Reviewed: Did you receive and understand the discharge instructions provided?: Yes Medications obtained,verified, and reconciled?: Yes (Medications Reviewed) Any new allergies since your discharge?: Yes Dietary orders reviewed?: Yes Do you have support at home?: Yes People in Home: facility resident  Medications Reviewed Today: Medications Reviewed Today     Reviewed by Karena Addison, LPN (Licensed Practical Nurse) on 01/27/23 at 1501  Med List Status: <None>   Medication Order Taking? Sig Documenting Provider Last Dose Status Informant  acetaminophen (PAIN RELIEF EXTRA STRENGTH) 500 MG tablet [id number]  TAKE (2) TABLETS BY MOUTH EVERY SIX HOURS A NEEDED FOR PAIN. Babs Sciara, MD  Active   acetaminophen (TYLENOL) 325 MG tablet [id number] No TAKE 2 TABLETS(650MG ) BY MOUTH EVERY 6 HOURS AS NEEDED FOR PAIN/TEMPERATURE.  Patient taking differently: Take 650 mg by mouth every 6 (six) hours as needed for moderate pain.   Babs Sciara, MD Taking Active Nursing Home Medication Administration Guide (MAG)  ALPRAZolam Prudy Feeler) 0.25 MG tablet [id number] No 1 po qhs prn Babs Sciara, MD Taking Active   amLODipine (NORVASC) 10 MG tablet [id number] No Take 1 tablet (10 mg total) by mouth daily. Shon Hale, MD Taking Active   benzonatate (TESSALON) 100 MG capsule [id number] No TAKE (1) CAPSULE BY MOUTH 3 TIMES DAILY AS NEEDED FOR COUGH.  Patient taking differently: Take 100 mg by mouth 3  (three) times daily as needed for cough.   Babs Sciara, MD Taking Active Nursing Home Medication Administration Guide (MAG)  beta carotene w/minerals (OCUVITE) tablet [id number] No Take 1 tablet by mouth at bedtime.  Patient taking differently: Take 1 tablet by mouth daily.   Babs Sciara, MD Taking Active Nursing Home Medication Administration Guide (MAG)  BISACODYL 5 MG EC tablet [id number] No TAKE 1 TABLET BY MOUTH ONCE DAILY AS NEEDED FOR MODERATE CONSTIPATION. Babs Sciara, MD Taking Active   Carboxymethylcellulose Sodium (THERATEARS) 0.25 % SOLN [id number] No Apply 1 drop to eye in the morning and at bedtime. [provider] Taking Active Nursing Home Medication Administration Guide (MAG)  cephALEXin (KEFLEX) 500 MG capsule [id number]  Take 1 capsule (500 mg total) by mouth 3 (three) times daily for 7 days. Eber Hong, MD  Active   COMBIVENT RESPIMAT 20-100 MCG/ACT AERS respimat [id number] No INHALE (1) PUFF INTO THE LUNGS EVERY SIX HOURS AS NEEDED FOR WHEEZING OR SHORTNESS OF BREATH.  Patient taking differently: Inhale 1 puff into the lungs every 6 (six) hours as needed for wheezing or shortness of breath.   Babs Sciara, MD Taking Active Nursing Home Medication Administration Guide (MAG)  cyanocobalamin 1000 MCG tablet [id number] No Take 1,000 mcg by mouth at bedtime. [provider] Taking Active Nursing Home Medication Administration Guide (MAG)  dicyclomine (BENTYL) 10 MG capsule [id number] No TAKE 1 CAPSULE BY MOUTH TWICE DAILY AS NEEDED FOR ABDOMINAL CRAMPS AND LOOSE STOOL. Babs Sciara, MD Taking Active Nursing Home Medication Administration Guide (MAG)  EASYMAX TEST test strip [id number] No CHECK BLOOD SUGAR ONCE DAILY.(CALL MD IF BS BELOW 60: OR  IF BS ABOVE 400) Luking, [name], MD Taking Active Nursing Home Medication Administration Guide (MAG)  gabapentin (NEURONTIN) 100 MG capsule [id number] No 2 po bid  Patient taking differently: Take 200 mg by mouth 2  (two) times daily.   Babs Sciara, MD Taking Active Nursing Home Medication Administration Guide (MAG)  GOODSENSE HEMORRHOIDAL 0.25-14-74.9 % rectal ointment [id number] No Place 1 Application rectally 3 (three) times daily as needed for irritation. [provider] Taking Active Nursing Home Medication Administration Guide (MAG)  loperamide (ANTI-DIARRHEAL) 2 MG tablet [id number] No TAKE 1 TABLET BY MOUTH AS NEEDED FOR DIARRHEA OR LOOSE STOOL**MAX 4 TABLETS IN 24 HOURS** Luking, Scott A, MD Taking Active   loratadine (CLARITIN) 10 MG tablet [id number] No Take 10 mg by mouth daily. [provider] Taking Active Nursing Home Medication Administration Guide (MAG)  losartan (COZAAR) 100 MG tablet [id number] No Take 1 tablet daily Emokpae, Courage, MD Taking Active   meclizine (ANTIVERT) 25 MG tablet [id number]  Take 1 tablet (25 mg total) by mouth 3 (three) times daily as needed for dizziness or nausea. Babs Sciara, MD  Active   Melatonin 3 MG SUBL [id number] No TAKE (1) TABLET BY MOUTH AT BEDTIME.  Patient taking differently: Take 1 tablet by mouth at bedtime.   Babs Sciara, MD Taking Active Nursing Home Medication Administration Guide (MAG)  metFORMIN (GLUCOPHAGE) 500 MG tablet [id number] No Take one tablet 500mg  po in the morning and 1/2 tablet 250 mg po at supper Shon Hale, MD Taking Active   Multiple Vitamins-Minerals (PRESERVISION AREDS 2) CAPS [id number] No Take 1 capsule by mouth in the morning and at bedtime. [provider] Taking Active Nursing Home Medication Administration Guide (MAG)  Omega-3 Fatty Acids (FISH OIL) 1000 MG CAPS [id number] No One capsule BID Babs Sciara, MD Taking Active Nursing Home Medication Administration Guide (MAG)  pantoprazole (PROTONIX) 40 MG tablet [id number] No Take 1 tablet (40 mg total) by mouth daily. Shon Hale, MD Taking Active   Vitamin D, Cholecalciferol, 10 MCG (400 UNIT) TABS [id number] No TAKE 1 TABLET BY MOUTH  ONCE DAILY. Babs Sciara, MD Taking Active Nursing Home Medication Administration Guide [facility])  Med List Note Dianah Field 03/26/22 3500): [facility] [phone]            Home Care and Equipment/Supplies: Were Home Health Services Ordered?: NA Any new equipment or medical supplies ordered?: NA  Functional Questionnaire: Do you need assistance with bathing/showering or dressing?: No Do you need assistance with meal preparation?: No Do you need assistance with eating?: No Do you have difficulty maintaining continence: No Do you need assistance with getting out of bed/getting out of a chair/moving?: No Do you have difficulty managing or taking your medications?: No  Follow up appointments reviewed: PCP Follow-up appointment confirmed?: Yes Date of PCP follow-up appointment?: 01/29/23 Follow-up Provider: [facility] Follow-up appointment confirmed?: NA Do you need transportation to your follow-up appointment?: No Do you understand care options if your condition(s) worsen?: Yes-patient verbalized understanding    SIGNATURE Karena Addison, LPN [facility] Nurse Health Advisor Direct Dial [phone]

## 2023-01-28 DIAGNOSIS — H25812 Combined forms of age-related cataract, left eye: Secondary | ICD-10-CM | POA: Diagnosis not present

## 2023-01-29 ENCOUNTER — Ambulatory Visit (INDEPENDENT_AMBULATORY_CARE_PROVIDER_SITE_OTHER): Payer: Medicare Other | Admitting: Family Medicine

## 2023-01-29 VITALS — BP 132/68 | HR 90 | Temp 97.9°F | Ht 68.0 in | Wt 163.0 lb

## 2023-01-29 DIAGNOSIS — E871 Hypo-osmolality and hyponatremia: Secondary | ICD-10-CM

## 2023-01-29 DIAGNOSIS — W19XXXD Unspecified fall, subsequent encounter: Secondary | ICD-10-CM | POA: Diagnosis not present

## 2023-01-29 DIAGNOSIS — D500 Iron deficiency anemia secondary to blood loss (chronic): Secondary | ICD-10-CM | POA: Diagnosis not present

## 2023-01-29 DIAGNOSIS — S060XAD Concussion with loss of consciousness status unknown, subsequent encounter: Secondary | ICD-10-CM | POA: Diagnosis not present

## 2023-01-29 NOTE — Progress Notes (Signed)
   Subjective:    Patient ID: [name], female    DOB: [birth year], 89 y.o.   MRN: [id number]  HPI Follow up from 7/10 fall Patient is being followed by eye doctor has a small scratch in R eye on pred and ointment, also on Keflex for bladder infection and is feeling very drained today Feels washed out from the antibiotics denies high fever chills sweats denies wheezing difficulty breathing ER notes reviewed treated for UTI with Keflex  Review of Systems     Objective:   Physical Exam  Her balance is doing better walking with a walker lungs clear heart regular blood pressure good scleral hemorrhage looks much better      Assessment & Plan:  Head contusion now doing better UTI take Keflex for 5 full days then stop No sign of any type of sepsis going on Follow-up again later this year  Lab work to read look at iron deficient anemia as well as hyponatremia do the labs within 2 weeks

## 2023-02-03 ENCOUNTER — Other Ambulatory Visit: Payer: Self-pay | Admitting: Family Medicine

## 2023-02-04 ENCOUNTER — Other Ambulatory Visit: Payer: Self-pay | Admitting: Family Medicine

## 2023-02-05 ENCOUNTER — Telehealth: Payer: Self-pay | Admitting: Family Medicine

## 2023-02-05 ENCOUNTER — Other Ambulatory Visit: Payer: Self-pay | Admitting: Family Medicine

## 2023-02-05 DIAGNOSIS — N390 Urinary tract infection, site not specified: Secondary | ICD-10-CM | POA: Diagnosis not present

## 2023-02-05 DIAGNOSIS — R42 Dizziness and giddiness: Secondary | ICD-10-CM | POA: Diagnosis not present

## 2023-02-05 DIAGNOSIS — E663 Overweight: Secondary | ICD-10-CM | POA: Diagnosis not present

## 2023-02-05 DIAGNOSIS — Z6828 Body mass index (BMI) 28.0-28.9, adult: Secondary | ICD-10-CM | POA: Diagnosis not present

## 2023-02-05 NOTE — Telephone Encounter (Signed)
She may come today at 345 p.m. Only other option would be to work her into the schedule on Friday

## 2023-02-05 NOTE — Telephone Encounter (Signed)
Patient states she could not take full course of Keflex because it made her feel so sick and bad and stopped it Sunday but still just feels bad- doesn't know if she still needs a new antibiotic for UTI or what she just feels bad

## 2023-02-05 NOTE — Telephone Encounter (Signed)
I agree

## 2023-02-05 NOTE — Telephone Encounter (Signed)
Nurses-very hard to know what to do.  Need more concrete information to go on regarding whether or not to do blood work office visit etc. Please talk with Center to see how low that she is eating?  Fevers?  Not feeling well is very vague need more details

## 2023-02-05 NOTE — Telephone Encounter (Signed)
Spoke with [facility] and they stated they have taken patient to urgent care for evaluation.

## 2023-02-05 NOTE — Telephone Encounter (Signed)
[name] from [facility] calling about patient. She state not feeling well still ,she is weak and not herself. Please advise [phone] [name]

## 2023-02-05 NOTE — Telephone Encounter (Signed)
She states she feels drained and no energy at all- low grade fever 100- clammy and don't feel like eating anything but drinking some- about 48 oz fluid today

## 2023-02-06 ENCOUNTER — Other Ambulatory Visit: Payer: Self-pay | Admitting: Family Medicine

## 2023-02-10 ENCOUNTER — Other Ambulatory Visit: Payer: Self-pay | Admitting: Family Medicine

## 2023-02-10 DIAGNOSIS — H18451 Nodular corneal degeneration, right eye: Secondary | ICD-10-CM | POA: Diagnosis not present

## 2023-02-11 ENCOUNTER — Other Ambulatory Visit: Payer: Self-pay | Admitting: *Deleted

## 2023-02-11 DIAGNOSIS — E871 Hypo-osmolality and hyponatremia: Secondary | ICD-10-CM | POA: Diagnosis not present

## 2023-02-11 DIAGNOSIS — D500 Iron deficiency anemia secondary to blood loss (chronic): Secondary | ICD-10-CM | POA: Diagnosis not present

## 2023-02-11 MED ORDER — PANTOPRAZOLE SODIUM 40 MG PO TBEC: 40.0000 mg | DELAYED_RELEASE_TABLET | Freq: Every day | ORAL | 2 refills | Status: DC

## 2023-02-20 ENCOUNTER — Other Ambulatory Visit: Payer: Self-pay

## 2023-02-20 DIAGNOSIS — Z1211 Encounter for screening for malignant neoplasm of colon: Secondary | ICD-10-CM

## 2023-02-20 DIAGNOSIS — D500 Iron deficiency anemia secondary to blood loss (chronic): Secondary | ICD-10-CM

## 2023-02-20 MED ORDER — IRON (FERROUS SULFATE) 325 (65 FE) MG PO TABS
325.0000 mg | ORAL_TABLET | ORAL | 2 refills | Status: DC
Start: 2023-02-20 — End: 2023-10-07

## 2023-02-24 ENCOUNTER — Other Ambulatory Visit: Payer: Self-pay | Admitting: Family Medicine

## 2023-03-04 ENCOUNTER — Other Ambulatory Visit (INDEPENDENT_AMBULATORY_CARE_PROVIDER_SITE_OTHER): Payer: Medicare Other

## 2023-03-04 ENCOUNTER — Other Ambulatory Visit: Payer: Self-pay

## 2023-03-04 DIAGNOSIS — Z1211 Encounter for screening for malignant neoplasm of colon: Secondary | ICD-10-CM

## 2023-03-04 DIAGNOSIS — R195 Other fecal abnormalities: Secondary | ICD-10-CM

## 2023-03-04 DIAGNOSIS — D500 Iron deficiency anemia secondary to blood loss (chronic): Secondary | ICD-10-CM

## 2023-03-04 LAB — IFOBT (OCCULT BLOOD): IFOBT: POSITIVE

## 2023-03-06 ENCOUNTER — Encounter (INDEPENDENT_AMBULATORY_CARE_PROVIDER_SITE_OTHER): Payer: Self-pay | Admitting: *Deleted

## 2023-03-07 ENCOUNTER — Encounter (HOSPITAL_COMMUNITY): Payer: Self-pay | Admitting: *Deleted

## 2023-03-07 ENCOUNTER — Emergency Department (HOSPITAL_COMMUNITY): Payer: Medicare Other

## 2023-03-07 ENCOUNTER — Other Ambulatory Visit: Payer: Self-pay

## 2023-03-07 ENCOUNTER — Emergency Department (HOSPITAL_COMMUNITY)
Admission: EM | Admit: 2023-03-07 | Discharge: 2023-03-07 | Disposition: A | Discharge status: Home / Self Care | Payer: Medicare Other | Source: Home / Self Care | Attending: Emergency Medicine | Admitting: Emergency Medicine

## 2023-03-07 DIAGNOSIS — I10 Essential (primary) hypertension: Secondary | ICD-10-CM | POA: Insufficient documentation

## 2023-03-07 DIAGNOSIS — Q272 Other congenital malformations of renal artery: Secondary | ICD-10-CM | POA: Diagnosis not present

## 2023-03-07 DIAGNOSIS — R531 Weakness: Secondary | ICD-10-CM | POA: Diagnosis not present

## 2023-03-07 DIAGNOSIS — Z7984 Long term (current) use of oral hypoglycemic drugs: Secondary | ICD-10-CM | POA: Insufficient documentation

## 2023-03-07 DIAGNOSIS — R918 Other nonspecific abnormal finding of lung field: Secondary | ICD-10-CM | POA: Diagnosis not present

## 2023-03-07 DIAGNOSIS — R6883 Chills (without fever): Secondary | ICD-10-CM | POA: Diagnosis not present

## 2023-03-07 DIAGNOSIS — R1084 Generalized abdominal pain: Secondary | ICD-10-CM | POA: Insufficient documentation

## 2023-03-07 DIAGNOSIS — Z79899 Other long term (current) drug therapy: Secondary | ICD-10-CM | POA: Insufficient documentation

## 2023-03-07 DIAGNOSIS — I701 Atherosclerosis of renal artery: Secondary | ICD-10-CM | POA: Diagnosis not present

## 2023-03-07 DIAGNOSIS — K439 Ventral hernia without obstruction or gangrene: Secondary | ICD-10-CM | POA: Diagnosis not present

## 2023-03-07 DIAGNOSIS — R0902 Hypoxemia: Secondary | ICD-10-CM | POA: Diagnosis not present

## 2023-03-07 DIAGNOSIS — E119 Type 2 diabetes mellitus without complications: Secondary | ICD-10-CM | POA: Insufficient documentation

## 2023-03-07 DIAGNOSIS — Z20822 Contact with and (suspected) exposure to covid-19: Secondary | ICD-10-CM | POA: Insufficient documentation

## 2023-03-07 DIAGNOSIS — I517 Cardiomegaly: Secondary | ICD-10-CM | POA: Diagnosis not present

## 2023-03-07 DIAGNOSIS — N390 Urinary tract infection, site not specified: Secondary | ICD-10-CM | POA: Diagnosis not present

## 2023-03-07 DIAGNOSIS — K8020 Calculus of gallbladder without cholecystitis without obstruction: Secondary | ICD-10-CM | POA: Diagnosis not present

## 2023-03-07 LAB — CBC
HCT: 39.3 % (ref 36.0–46.0)
Hemoglobin: 12.9 g/dL (ref 12.0–15.0)
MCH: 29.1 pg (ref 26.0–34.0)
MCHC: 32.8 g/dL (ref 30.0–36.0)
MCV: 88.5 fL (ref 80.0–100.0)
Platelets: 266 10*3/uL (ref 150–400)
RBC: 4.44 MIL/uL (ref 3.87–5.11)
RDW: 13.6 % (ref 11.5–15.5)
WBC: 7.2 10*3/uL (ref 4.0–10.5)
nRBC: 0 % (ref 0.0–0.2)

## 2023-03-07 LAB — COMPREHENSIVE METABOLIC PANEL
ALT: 17 U/L (ref 0–44)
AST: 18 U/L (ref 15–41)
Albumin: 3.9 g/dL (ref 3.5–5.0)
Alkaline Phosphatase: 80 U/L (ref 38–126)
Anion gap: 9 (ref 5–15)
BUN: 14 mg/dL (ref 8–23)
CO2: 30 mmol/L (ref 22–32)
Calcium: 9.5 mg/dL (ref 8.9–10.3)
Chloride: 94 mmol/L — ABNORMAL LOW (ref 98–111)
Creatinine, Ser: 0.8 mg/dL (ref 0.44–1.00)
GFR, Estimated: >60 mL/min (ref >60)
Glucose, Bld: 139 mg/dL — ABNORMAL HIGH (ref 70–99)
Potassium: 4.3 mmol/L (ref 3.5–5.1)
Sodium: 133 mmol/L — ABNORMAL LOW (ref 135–145)
Total Bilirubin: 1 mg/dL (ref 0.3–1.2)
Total Protein: 6.9 g/dL (ref 6.5–8.1)

## 2023-03-07 LAB — URINALYSIS, ROUTINE W REFLEX MICROSCOPIC
Bacteria, UA: NONE SEEN
Bilirubin Urine: NEGATIVE
Glucose, UA: NEGATIVE mg/dL
Hgb urine dipstick: NEGATIVE
Ketones, ur: NEGATIVE mg/dL
Nitrite: NEGATIVE
Protein, ur: 30 mg/dL — AB
Specific Gravity, Urine: 1.004 — ABNORMAL LOW (ref 1.005–1.030)
pH: 8 (ref 5.0–8.0)

## 2023-03-07 LAB — POC OCCULT BLOOD, ED: Fecal Occult Bld: NEGATIVE

## 2023-03-07 LAB — MAGNESIUM: Magnesium: 1.7 mg/dL (ref 1.7–2.4)

## 2023-03-07 LAB — SARS CORONAVIRUS 2 BY RT PCR: SARS Coronavirus 2 by RT PCR: NEGATIVE

## 2023-03-07 MED ORDER — IOHEXOL 350 MG/ML SOLN
100.0000 mL | Freq: Once | INTRAVENOUS | Status: AC | PRN
  Administered 2023-03-07: 100 mL via INTRAVENOUS

## 2023-03-07 NOTE — Discharge Instructions (Addendum)
Thank you for coming to [facility] Emergency Department. You were seen for abdominal pain, generalized weakness. 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  For these you can follow-up with surgery in clinic and with vascular surgery in clinic. You could also discuss these things with your primary care doctor.  Please continue to stay well-hydrated at home.  Please follow up with your primary care provider within 1 week.   Do not hesitate to return to the ED or call 911 if you experience: -Worsening symptoms -Nausea/vomiting so severe you cannot eat/drink anything -Abdominal pain -Lightheadedness, passing out -Fevers/chills -Anything else that concerns you

## 2023-03-07 NOTE — ED Notes (Signed)
Pt in ct 

## 2023-03-07 NOTE — ED Notes (Signed)
Pt is alert and oriented x4. Color appears normal. Unable to ambulate without wheel chair, but can transfer self from bed to chair. Denies GU symptoms. Pt stated having intense 'chills' waking her up early in the morning.

## 2023-03-07 NOTE — ED Provider Notes (Signed)
[place] [facility] AT [facility] Provider Note   CSN: [id number] Arrival date & time: 03/07/23  6301     History  Chief Complaint  Patient presents with   Weakness    [name] is a 89 y.o. female with PMH as listed below who is BIB [facility] from [facility] for c/o generalized weakness x one week.  She states that she had labs checked by her primary care physician who told her that her iron was low and started on iron supplements.  Also called her yesterday to tell her that her fecal occult blood test was positive and that she would need to see a GI specialist.  Overnight last night she began to have shaking chills and is "feeling bad" and so came to the ED.  Also reports generalized "all over" pain including in her abdomen that began approximately 1 week ago.  Denies any urinary symptoms or hematuria, vaginal bleeding.  Has not noted any blood in her stool but states that it has been darker since starting the iron supplements.  Denies any history of bleeding diathesis, PE/DVT, and does not take a blood thinner.  She denies chest pain, cough, nausea vomiting, but does endorse mild shortness of breath that occurred yesterday.  Past Medical History:  Diagnosis Date   Cognitive dysfunction 03/04/2019   Patient scores 21 out of 30 on [place] cognitive assessment September 2020   Diabetes mellitus without complication [facility])    Diabetic peripheral neuropathy associated with type 2 diabetes mellitus (HCC) 02/25/2022   Diverticulitis    Frailty 03/04/2019   H/O bilateral breast reduction surgery    Hyperlipidemia    a. intolerant to statins.    Hypertension        Home Medications Prior to Admission medications   Medication Sig Start Date End Date Taking? Authorizing Provider  acetaminophen (PAIN RELIEF EXTRA STRENGTH) 500 MG tablet TAKE (2) TABLETS BY MOUTH EVERY SIX HOURS A NEEDED FOR PAIN. 01/15/23   Babs Sciara, MD  acetaminophen (TYLENOL) 325 MG tablet TAKE 2  TABLETS(650MG ) BY MOUTH EVERY 6 HOURS AS NEEDED FOR PAIN/TEMPERATURE. Patient taking differently: Take 650 mg by mouth every 6 (six) hours as needed for moderate pain. 08/20/22   Babs Sciara, MD  ALPRAZolam (XANAX) 0.25 MG tablet TAKE (1) TABLET BY MOUTH AT BEDTIME AS NEEDED UNTIL GONE. FOR SHORT-TERM USE ONLY. 02/25/23   Babs Sciara, MD  amLODipine (NORVASC) 10 MG tablet Take 1 tablet (10 mg total) by mouth daily. 12/02/22   Emokpae, Courage, MD  benzonatate (TESSALON) 100 MG capsule TAKE (1) CAPSULE BY MOUTH 3 TIMES DAILY AS NEEDED FOR COUGH. Patient taking differently: Take 100 mg by mouth 3 (three) times daily as needed for cough. 11/21/22   Babs Sciara, MD  BETA CAROTENE PROVITAMIN A 60109 units capsule Take by mouth. 01/01/23   [provider]  beta carotene w/minerals (OCUVITE) tablet Take 1 tablet by mouth at bedtime. Patient taking differently: Take 1 tablet by mouth daily. 07/03/22   Babs Sciara, MD  BISACODYL 5 MG EC tablet TAKE 1 TABLET BY MOUTH ONCE DAILY AS NEEDED FOR MODERATE CONSTIPATION. 12/20/22   Babs Sciara, MD  Carboxymethylcellulose Sodium (THERATEARS) 0.25 % SOLN Apply 1 drop to eye in the morning and at bedtime.    [provider]  COMBIVENT RESPIMAT 20-100 MCG/ACT AERS respimat INHALE (1) PUFF INTO THE LUNGS EVERY SIX HOURS AS NEEDED FOR WHEEZING OR SHORTNESS OF BREATH. Patient taking  differently: Inhale 1 puff into the lungs every 6 (six) hours as needed for wheezing or shortness of breath. 11/21/22   Babs Sciara, MD  cyanocobalamin 1000 MCG tablet Take 1,000 mcg by mouth at bedtime.    [provider]  dicyclomine (BENTYL) 10 MG capsule TAKE 1 CAPSULE BY MOUTH TWICE DAILY AS NEEDED FOR ABDOMINAL CRAMPS AND LOOSE STOOL. 11/21/22   Luking, [name], MD  EASYMAX TEST test strip CHECK BLOOD SUGAR ONCE DAILY.(CALL MD IF BS BELOW 60: OR IF BS ABOVE 400) 02/03/23   Babs Sciara, MD  erythromycin ophthalmic ointment SMARTSIG:In Eye(s) 01/28/23    [provider]  gabapentin (NEURONTIN) 100 MG capsule 2 po bid Patient taking differently: Take 200 mg by mouth 2 (two) times daily. 11/19/22   Babs Sciara, MD  GOODSENSE HEMORRHOIDAL 0.25-14-74.9 % rectal ointment Place 1 Application rectally 3 (three) times daily as needed for irritation. 12/26/21   [provider]  Iron, Ferrous Sulfate, 325 (65 Fe) MG TABS Take 325 mg by mouth every other day. 02/20/23   Babs Sciara, MD  loperamide (ANTI-DIARRHEAL) 2 MG tablet TAKE 1 TABLET BY MOUTH AS NEEDED FOR DIARRHEA OR LOOSE STOOL**MAX 4 TABLETS IN 24 HOURS** 12/27/22   Luking, Scott A, MD  loratadine (CLARITIN) 10 MG tablet Take 10 mg by mouth daily.    [provider]  losartan (COZAAR) 100 MG tablet Take 1 tablet daily 12/02/22   Shon Hale, MD  meclizine (ANTIVERT) 25 MG tablet Take 1 tablet (25 mg total) by mouth 3 (three) times daily as needed for dizziness or nausea. 01/24/23   Babs Sciara, MD  Melatonin 3 MG SUBL TAKE (1) TABLET BY MOUTH AT BEDTIME. Patient taking differently: Take 1 tablet by mouth at bedtime. 05/30/22   Babs Sciara, MD  metFORMIN (GLUCOPHAGE) 500 MG tablet Take one tablet 500mg  po in the morning and 1/2 tablet 250 mg po at supper 12/02/22   Shon Hale, MD  Multiple Vitamins-Minerals (PRESERVISION AREDS 2) CAPS Take 1 capsule by mouth in the morning and at bedtime.    [provider]  NON FORMULARY     [provider]  Omega-3 Fatty Acids (FISH OIL) 1000 MG CAPS One capsule BID 08/07/21   Luking, Scott A, MD  pantoprazole (PROTONIX) 40 MG tablet Take 1 tablet (40 mg total) by mouth daily. 02/11/23   Babs Sciara, MD  prednisoLONE acetate (PRED FORTE) 1 % ophthalmic suspension SMARTSIG:In Eye(s) 01/28/23   [provider]  Vitamin D, Cholecalciferol, 10 MCG (400 UNIT) TABS TAKE 1 TABLET BY MOUTH ONCE DAILY. 05/12/20   Babs Sciara, MD      Allergies    Hydrocortisone, Lisinopril, Phenergan  [promethazine hcl], Statins, Trazodone and nefazodone, and Prednisone    Review of Systems   Review of Systems A 10 point review of systems was performed and is negative unless otherwise reported in HPI.  Physical Exam Updated Vital Signs BP (!) 168/66 (BP Location: Left Arm)   Pulse 77   Temp (!) 97.5 F (36.4 C) (Oral)   Resp 20   Ht 5\' 8"  (1.727 m)   Wt 73.5 kg   SpO2 95%   BMI 24.63 kg/m  Physical Exam General: Normal appearing elderly female, lying in bed.  HEENT:  Sclera anicteric, MMM, trachea midline.  Cardiology: RRR, no murmurs/rubs/gallops. Resp: Normal respiratory rate and effort. CTAB, no wheezes, rhonchi, crackles.  Abd: Mildly diffusely tender to palpation. Soft, non-distended. No  rebound tenderness or guarding.  GU: Deferred. MSK: No peripheral edema or signs of trauma.  Skin: warm, dry.  Neuro: A&Ox4, CNs II-XII grossly intact. MAEs. Sensation grossly intact.  Psych: Normal mood and affect.   ED Results / Procedures / Treatments   Labs (all labs ordered are listed, but only abnormal results are displayed) Labs Reviewed  URINALYSIS, ROUTINE W REFLEX MICROSCOPIC - Abnormal; Notable for the following components:      Result Value   Color, Urine COLORLESS (*)    Specific Gravity, Urine 1.004 (*)    Protein, ur 30 (*)    Leukocytes,Ua TRACE (*)    All other components within normal limits  COMPREHENSIVE METABOLIC PANEL - Abnormal; Notable for the following components:   Sodium 133 (*)    Chloride 94 (*)    Glucose, Bld 139 (*)    All other components within normal limits  SARS CORONAVIRUS 2 BY RT PCR  CBC  MAGNESIUM  CBG MONITORING, ED  POC OCCULT BLOOD, ED    EKG EKG Interpretation Date/Time:  Friday March 07 2023 09:32:47 EDT Ventricular Rate:  67 PR Interval:  198 QRS Duration:  96 QT Interval:  378 QTC Calculation: 399 R Axis:   40  Text Interpretation: Sinus rhythm Anterior infarct, old Baseline artifact Similar to prior EKGs  Confirmed by Vivi Barrack [phone]) on 03/07/2023 11:38:31 AM  Radiology CT Angio Abd/Pel W and/or Wo Contrast  Result Date: 03/07/2023 CLINICAL DATA:  Lower GI bleed.  Abdominal pain.  Blood in stool. EXAM: CTA ABDOMEN AND PELVIS WITHOUT AND WITH CONTRAST TECHNIQUE: Multidetector CT imaging of the abdomen and pelvis was performed using the standard protocol during bolus administration of intravenous contrast. Multiplanar reconstructed images and MIPs were obtained and reviewed to evaluate the vascular anatomy. RADIATION DOSE REDUCTION: This exam was performed according to the departmental dose-optimization program which includes automated exposure control, adjustment of the mA and/or kV according to patient size and/or use of iterative reconstruction technique. CONTRAST:  OMNIPAQUE IOHEXOL 350 MG/ML SOLN COMPARISON:  CT of the abdomen and pelvis with contrast 02/25/2021 FINDINGS: VASCULAR Aorta: Extensive atherosclerotic calcifications are present throughout the abdominal aorta. No aneurysm or focal stenosis is present. Celiac: Atherosclerotic calcifications are present at the origin of the celiac artery with proximally 50% stenosis relative to the more distal vessel. The branch vessels are unremarkable. SMA: Atherosclerotic changes are present at the origin of the SMA. No significant stenosis is present relative to the more distal vessel. Mural plaque is noted approximately 3 cm from the origin of the SMA without a significant stenosis relative to the more distal vessel. The branch vessels are unremarkable. Renals: High-grade stenoses are present proximal renal arteries bilaterally, right greater than left. Branch vessels are within normal limits. An accessory left renal artery feeds the lower pole. IMA: A high-grade stenosis is present at the origin of the inferior mesenteric artery. The more distal branch vessels are within normal limits. Inflow: Atherosclerotic calcifications are present in the iliac  arteries bilaterally without significant focal stenosis or aneurysm. Proximal Outflow: Atherosclerotic changes are present. No aneurysm or stenosis is present. Veins: No obvious venous abnormality within the limitations of this arterial phase study. Review of the MIP images confirms the above findings. NON-VASCULAR Lower chest: The lung bases are clear without focal nodule, mass, or airspace disease. The heart size is normal. Hepatobiliary: A 6 mm cyst in the right lobe of the liver is stable. No other focal hepatic lesions are present. The common  bile is within normal limits. Multiple layering stones are again noted in the gallbladder. No inflammatory changes are present. Pancreas: Unremarkable. No pancreatic ductal dilatation or surrounding inflammatory changes. Spleen: Normal in size without focal abnormality. Adrenals/Urinary Tract: A 10 mm simple cyst is noted anteriorly in the right kidney. A smaller simple cyst is present more posteriorly. No solid mass lesion or stone is present. No obstruction is present. The ureters are within normal limits. The urinary bladder is normal. Stomach/Bowel: The stomach and duodenum are within normal limits. Multiple small bowel loops are adherent to the anterior peritoneum. No obstruction is present. No focal bowel inflammation is present. The terminal ileum is within normal limits. The appendix is not visualized and may be surgically absent. No focal inflammatory changes are present in the region. The ascending and transverse colon are within normal limits. The descending and sigmoid colon are normal. Lymphatic: No significant abdominal adenopathy is present. Reproductive: Status post hysterectomy. No adnexal masses. Other: Ventral hernia repair is intact. No significant ventral hernias are present. No free fluid or free air is present. Musculoskeletal: Vertebral body heights and alignment are normal. Straightening of the normal lumbar lordosis is present. IMPRESSION: 1. No  acute or focal lesion to explain the patient's symptoms. 2. 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 An accessory left renal artery feeds the lower pole of the left kidney without focal stenosis. 3. 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 4. 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 5. Atherosclerotic changes at the origin of the SMA without significant stenosis relative to the more distal vessel. 6. Stable cholelithiasis without evidence of cholecystitis. 7. Simple cysts of the right kidney. No follow-up imaging is recommended. JACR 2018 Feb; 264-273, Management of the Incidental Renal Mass on CT, [employer] 2021; 814-848, Bosniak Classification of Cystic Renal Masses, Version 2019. 8. Ventral hernia repair is intact. 9.  Aortic Atherosclerosis (ICD10-I70.0). Electronically Signed   By: Marin Roberts M.D.   On: 03/07/2023 11:31   DG Chest 2 View  Result Date: 03/07/2023 CLINICAL DATA:  Weakness and chills. EXAM: CHEST - 2 VIEW COMPARISON:  One-view chest x-ray 01/25/2023. Two-view chest x-ray 11/30/2022. FINDINGS: The heart is enlarged. Minimal atelectasis or scarring is present the right base. Lungs are otherwise clear. No edema or effusion is present. No focal airspace disease is present. Atherosclerotic changes are again noted in the aorta. The visualized soft tissues and bony thorax are unremarkable. IMPRESSION: 1. Cardiomegaly without failure. 2. Minimal atelectasis or scarring at the right base. Electronically Signed   By: Marin Roberts M.D.   On: 03/07/2023 11:19    Procedures Procedures    Medications Ordered in ED Medications  iohexol (OMNIPAQUE) 350 MG/ML injection 100 mL (100 mLs Intravenous Contrast Given 03/07/23 1100)    ED Course/ Medical Decision Making/ A&P                          Medical Decision Making Amount and/or Complexity of Data Reviewed Labs: ordered. Decision-making details documented  in ED Course. Radiology: ordered. Decision-making details documented in ED Course.  Risk Prescription drug management.    This patient presents to the ED for concern of gen weakness, , this involves an extensive number of treatment options, and is a complaint that carries with it a high risk of complications and morbidity.  I considered the following differential and admission for this acute, potentially life threatening condition.  MDM:    For DDX for abdominal pain includes but is not limited to:  Abdominal exam without peritoneal signs. No evidence of acute abdomen at this time.   Per chart review patient had positive fecal occult blood test on 03/04/2023.  Had hemoglobin drawn on 02/11/2023 which was 11.9.  Today hemoglobin is stable at 12.9.  No concern for acute blood loss anemia or significant GI hemorrhage/bleeding.  From this GI bleed standpoint patient is hemodynamically stable and I believe she is stable to follow-up in clinic for this.  However she does report generalized weakness, shaking chills that began yesterday.  She does have abdominal pain and this is concerning for some intra-abdominal infection including acute hepatobiliary disease, acute cholecystitis/cholangitis, acute infectious processes like pneumonia as patient has reported some shortness of breath, or COVID, UTI (patient did report a UTI treated 6 weeks ago), appendicitis or diverticulitis.  Consider also vascular catastrophe such as acute or chronic mesenteric ischemia especially with abdominal pain and GI bleed, though she does not report nausea vomiting or feeding intolerance.  Will evaluate for dehydration, electrolyte abnormalities, renal injury as well.  Will obtain labs and CT angio abdomen pelvis for further evaluation.   Clinical Course as of 03/07/23 1503  Fri Mar 07, 2023  1136 Urinalysis, Routine w reflex microscopic -Urine, Clean Catch(!) Not c/w UTI [HN]  1136 SARS Coronavirus 2 by RT PCR: NEGATIVE neg  [HN]  1136 Comprehensive metabolic panel(!) Unremarkable in the context of this patient's presentation  [HN]  1136 CBC wnl [HN]  1136 DG Chest 2 View 1. Cardiomegaly without failure. 2. Minimal atelectasis or scarring at the right base.   [HN]  1137 CT Angio Abd/Pel W and/or Wo Contrast 1. No acute or focal lesion to explain the patient's symptoms. 2. 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 An accessory left renal artery feeds the lower pole of the left kidney without focal stenosis. 3. 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 4. 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 5. Atherosclerotic changes at the origin of the SMA without significant stenosis relative to the more distal vessel. 6. Stable cholelithiasis without evidence of cholecystitis. 7. Simple cysts of the right kidney. No follow-up imaging is recommended. JACR 2018 Feb; 264-273, Management of the Incidental Renal Mass on CT, [employer] 2021; 814-848, Bosniak Classification of Cystic Renal Masses, Version 2019. 8. Ventral hernia repair is intact. 9.  Aortic Atherosclerosis   [HN]  1139 WBC: 7.2 No leukocytosis [HN]  1233 Fecal Occult Blood, POC: NEGATIVE [HN]  1501 Patient reevaluated.  She is overall very well-appearing.  She was able to take p.o. in the ED without issue, without increase in her abdominal pain or nausea and vomiting.  Her CT of her abdomen and pelvis did demonstrate high-grade stenoses of proximal renal arteries as well as high-grade stenoses of the IMA.  She does not have any high-grade stenosis of the SMA or celiac artery that would indicate intestinal angina as a cause of her pain.  She also has stable cholelithiasis which could contribute to her abdominal pain but she took p.o. and is doing okay.  Her lab work otherwise is unremarkable and her fecal occult is actually negative today.  I discussed with the patient about following  up with her gastroenterologist anyway as she did have a positive fecal occult but her hemoglobin is stable and I do not believe she requires admission to the hospital for this.  She  drinks plenty of water at home and is hemodynamically stable, no need for IVF here now.  She is stable to follow-up with her primary care physician as an outpatient and I did place referrals as well to outpatient surgery and vascular surgery.  Patient reports understanding.  She is given discharge instructions and return precautions, all questions were answered to patient satisfaction. [HN]    Clinical Course User Index [HN] Loetta Rough, MD    Labs: I Ordered, and personally interpreted labs.  The pertinent results include:  those listed above  Imaging Studies ordered: I ordered imaging studies including CT angio abd pelvis, CXR I independently visualized and interpreted imaging. I agree with the radiologist interpretation  Additional history obtained from chart review.    Cardiac Monitoring: The patient was maintained on a cardiac monitor.  I personally viewed and interpreted the cardiac monitored which showed an underlying rhythm of: NSR  Reevaluation: After the interventions noted above, I reevaluated the patient and found that they have :improved  Social Determinants of Health: Lives independently  Disposition:  DC  Co morbidities that complicate the patient evaluation  Past Medical History:  Diagnosis Date   Cognitive dysfunction 03/04/2019   Patient scores 21 out of 30 on [place] cognitive assessment September 2020   Diabetes mellitus without complication (HCC)    Diabetic peripheral neuropathy associated with type 2 diabetes mellitus (HCC) 02/25/2022   Diverticulitis    Frailty 03/04/2019   H/O bilateral breast reduction surgery    Hyperlipidemia    a. intolerant to statins.    Hypertension      Medicines Meds ordered this encounter  Medications   iohexol (OMNIPAQUE) 350 MG/ML  injection 100 mL    I have reviewed the patients home medicines and have made adjustments as needed  Problem List / ED Course: Problem List Items Addressed This Visit       Other   Generalized weakness - Primary   Other Visit Diagnoses     Generalized abdominal pain                       This note was created using dictation software, which may contain spelling or grammatical errors.    Loetta Rough, MD 03/07/23 [phone]

## 2023-03-07 NOTE — ED Triage Notes (Signed)
Pt BIB [facility] from [facility] for c/o weakness x one week  Pt states she started to have chills yesterday  Pt was told that she has blood in her stool and recently started on iron supplements   Pt BP with ems 202/87 but took her HTN medication 10 mins pta of ems  Cbg 144 P 88 O2 96%

## 2023-03-10 ENCOUNTER — Other Ambulatory Visit: Payer: Self-pay

## 2023-03-10 ENCOUNTER — Inpatient Hospital Stay (HOSPITAL_COMMUNITY)
Admission: EM | Admit: 2023-03-10 | Discharge: 2023-03-13 | DRG: 690 | Disposition: A | Discharge status: Nursing Home (Includes ICF) | Payer: Medicare Other | Source: Skilled Nursing Facility | Attending: Family Medicine | Admitting: Family Medicine

## 2023-03-10 ENCOUNTER — Encounter (HOSPITAL_COMMUNITY): Payer: Self-pay

## 2023-03-10 DIAGNOSIS — Z79899 Other long term (current) drug therapy: Secondary | ICD-10-CM | POA: Diagnosis not present

## 2023-03-10 DIAGNOSIS — E871 Hypo-osmolality and hyponatremia: Secondary | ICD-10-CM | POA: Diagnosis present

## 2023-03-10 DIAGNOSIS — E1349 Other specified diabetes mellitus with other diabetic neurological complication: Secondary | ICD-10-CM | POA: Diagnosis not present

## 2023-03-10 DIAGNOSIS — E785 Hyperlipidemia, unspecified: Secondary | ICD-10-CM | POA: Diagnosis present

## 2023-03-10 DIAGNOSIS — Z96651 Presence of right artificial knee joint: Secondary | ICD-10-CM | POA: Diagnosis present

## 2023-03-10 DIAGNOSIS — Z87440 Personal history of urinary (tract) infections: Secondary | ICD-10-CM

## 2023-03-10 DIAGNOSIS — Z8249 Family history of ischemic heart disease and other diseases of the circulatory system: Secondary | ICD-10-CM | POA: Diagnosis not present

## 2023-03-10 DIAGNOSIS — R531 Weakness: Principal | ICD-10-CM

## 2023-03-10 DIAGNOSIS — I10 Essential (primary) hypertension: Secondary | ICD-10-CM | POA: Diagnosis present

## 2023-03-10 DIAGNOSIS — I700 Atherosclerosis of aorta: Secondary | ICD-10-CM | POA: Diagnosis present

## 2023-03-10 DIAGNOSIS — Z66 Do not resuscitate: Secondary | ICD-10-CM | POA: Diagnosis present

## 2023-03-10 DIAGNOSIS — Z1152 Encounter for screening for COVID-19: Secondary | ICD-10-CM | POA: Diagnosis not present

## 2023-03-10 DIAGNOSIS — Z823 Family history of stroke: Secondary | ICD-10-CM | POA: Diagnosis not present

## 2023-03-10 DIAGNOSIS — N390 Urinary tract infection, site not specified: Secondary | ICD-10-CM | POA: Diagnosis present

## 2023-03-10 DIAGNOSIS — E860 Dehydration: Secondary | ICD-10-CM | POA: Diagnosis present

## 2023-03-10 DIAGNOSIS — E119 Type 2 diabetes mellitus without complications: Secondary | ICD-10-CM

## 2023-03-10 DIAGNOSIS — N3000 Acute cystitis without hematuria: Secondary | ICD-10-CM | POA: Diagnosis not present

## 2023-03-10 DIAGNOSIS — I119 Hypertensive heart disease without heart failure: Secondary | ICD-10-CM | POA: Diagnosis present

## 2023-03-10 DIAGNOSIS — Z888 Allergy status to other drugs, medicaments and biological substances status: Secondary | ICD-10-CM

## 2023-03-10 DIAGNOSIS — Z90710 Acquired absence of both cervix and uterus: Secondary | ICD-10-CM | POA: Diagnosis not present

## 2023-03-10 DIAGNOSIS — G3184 Mild cognitive impairment, so stated: Secondary | ICD-10-CM | POA: Diagnosis present

## 2023-03-10 DIAGNOSIS — R54 Age-related physical debility: Secondary | ICD-10-CM | POA: Diagnosis not present

## 2023-03-10 DIAGNOSIS — E1142 Type 2 diabetes mellitus with diabetic polyneuropathy: Secondary | ICD-10-CM | POA: Diagnosis present

## 2023-03-10 DIAGNOSIS — Z885 Allergy status to narcotic agent status: Secondary | ICD-10-CM

## 2023-03-10 DIAGNOSIS — Z9049 Acquired absence of other specified parts of digestive tract: Secondary | ICD-10-CM

## 2023-03-10 DIAGNOSIS — E1140 Type 2 diabetes mellitus with diabetic neuropathy, unspecified: Secondary | ICD-10-CM | POA: Diagnosis present

## 2023-03-10 DIAGNOSIS — Z7984 Long term (current) use of oral hypoglycemic drugs: Secondary | ICD-10-CM | POA: Diagnosis not present

## 2023-03-10 DIAGNOSIS — K8020 Calculus of gallbladder without cholecystitis without obstruction: Secondary | ICD-10-CM | POA: Diagnosis present

## 2023-03-10 DIAGNOSIS — Z8041 Family history of malignant neoplasm of ovary: Secondary | ICD-10-CM

## 2023-03-10 DIAGNOSIS — Z833 Family history of diabetes mellitus: Secondary | ICD-10-CM | POA: Diagnosis not present

## 2023-03-10 DIAGNOSIS — K219 Gastro-esophageal reflux disease without esophagitis: Secondary | ICD-10-CM | POA: Diagnosis present

## 2023-03-10 LAB — COMPREHENSIVE METABOLIC PANEL
ALT: 17 U/L (ref 0–44)
AST: 19 U/L (ref 15–41)
Albumin: 4 g/dL (ref 3.5–5.0)
Alkaline Phosphatase: 80 U/L (ref 38–126)
Anion gap: 12 (ref 5–15)
BUN: 18 mg/dL (ref 8–23)
CO2: 26 mmol/L (ref 22–32)
Calcium: 9.5 mg/dL (ref 8.9–10.3)
Chloride: 90 mmol/L — ABNORMAL LOW (ref 98–111)
Creatinine, Ser: 0.76 mg/dL (ref 0.44–1.00)
GFR, Estimated: >60 mL/min (ref >60)
Glucose, Bld: 130 mg/dL — ABNORMAL HIGH (ref 70–99)
Potassium: 4 mmol/L (ref 3.5–5.1)
Sodium: 128 mmol/L — ABNORMAL LOW (ref 135–145)
Total Bilirubin: 1.1 mg/dL (ref 0.3–1.2)
Total Protein: 7 g/dL (ref 6.5–8.1)

## 2023-03-10 LAB — URINALYSIS, ROUTINE W REFLEX MICROSCOPIC
Bacteria, UA: NONE SEEN
Bilirubin Urine: NEGATIVE
Glucose, UA: NEGATIVE mg/dL
Hgb urine dipstick: NEGATIVE
Ketones, ur: NEGATIVE mg/dL
Nitrite: NEGATIVE
Protein, ur: 30 mg/dL — AB
Specific Gravity, Urine: 1.006 (ref 1.005–1.030)
pH: 6 (ref 5.0–8.0)

## 2023-03-10 LAB — CBC
HCT: 40.8 % (ref 36.0–46.0)
Hemoglobin: 13.3 g/dL (ref 12.0–15.0)
MCH: 28.7 pg (ref 26.0–34.0)
MCHC: 32.6 g/dL (ref 30.0–36.0)
MCV: 88.1 fL (ref 80.0–100.0)
Platelets: 164 10*3/uL (ref 150–400)
RBC: 4.63 MIL/uL (ref 3.87–5.11)
RDW: 13.5 % (ref 11.5–15.5)
WBC: 7.1 10*3/uL (ref 4.0–10.5)
nRBC: 0 % (ref 0.0–0.2)

## 2023-03-10 LAB — GLUCOSE, CAPILLARY
Glucose-Capillary: 124 mg/dL — ABNORMAL HIGH (ref 70–99)
Glucose-Capillary: 114 mg/dL — ABNORMAL HIGH (ref 70–99)

## 2023-03-10 LAB — HEMOGLOBIN A1C
Hgb A1c MFr Bld: 6.4 % — ABNORMAL HIGH (ref 4.8–5.6)
Mean Plasma Glucose: 136.98 mg/dL

## 2023-03-10 LAB — TROPONIN I (HIGH SENSITIVITY): Troponin I (High Sensitivity): 9 ng/L (ref <18)

## 2023-03-10 LAB — CBG MONITORING, ED: Glucose-Capillary: 161 mg/dL — ABNORMAL HIGH (ref 70–99)

## 2023-03-10 MED ORDER — METFORMIN HCL 500 MG PO TABS
250.0000 mg | ORAL_TABLET | Freq: Every day | ORAL | Status: DC
  Administered 2023-03-10 – 2023-03-12 (×3): 250 mg via ORAL
  Filled 2023-03-10 (×3): qty 1

## 2023-03-10 MED ORDER — OXYCODONE HCL 5 MG PO TABS
2.5000 mg | ORAL_TABLET | ORAL | Status: DC | PRN
  Administered 2023-03-12: 2.5 mg via ORAL
  Filled 2023-03-10: qty 1

## 2023-03-10 MED ORDER — IRON (FERROUS SULFATE) 325 (65 FE) MG PO TABS: 325.0000 mg | ORAL_TABLET | ORAL | Status: DC

## 2023-03-10 MED ORDER — SODIUM CHLORIDE 0.9 % IV BOLUS
1000.0000 mL | Freq: Once | INTRAVENOUS | Status: DC
  Administered 2023-03-10: 1000 mL via INTRAVENOUS

## 2023-03-10 MED ORDER — ALPRAZOLAM 0.25 MG PO TABS
0.2500 mg | ORAL_TABLET | Freq: Every evening | ORAL | Status: DC | PRN
  Administered 2023-03-10 – 2023-03-12 (×3): 0.25 mg via ORAL
  Filled 2023-03-10 (×3): qty 1

## 2023-03-10 MED ORDER — IPRATROPIUM-ALBUTEROL 0.5-2.5 (3) MG/3ML IN SOLN: 3.0000 mL | Freq: Four times a day (QID) | RESPIRATORY_TRACT | Status: DC | PRN

## 2023-03-10 MED ORDER — POLYVINYL ALCOHOL 1.4 % OP SOLN
1.0000 [drp] | Freq: Two times a day (BID) | OPHTHALMIC | Status: DC
  Administered 2023-03-10 – 2023-03-13 (×6): 1 [drp] via OPHTHALMIC
  Filled 2023-03-10: qty 15

## 2023-03-10 MED ORDER — ENOXAPARIN SODIUM 40 MG/0.4ML IJ SOSY
40.0000 mg | PREFILLED_SYRINGE | INTRAMUSCULAR | Status: DC
  Administered 2023-03-10 – 2023-03-12 (×3): 40 mg via SUBCUTANEOUS
  Filled 2023-03-10 (×3): qty 0.4

## 2023-03-10 MED ORDER — VITAMIN D (CHOLECALCIFEROL) 10 MCG (400 UNIT) PO TABS: 1.0000 | ORAL_TABLET | Freq: Every day | ORAL | Status: DC

## 2023-03-10 MED ORDER — VITAMIN D 25 MCG (1000 UNIT) PO TABS
1000.0000 [IU] | ORAL_TABLET | Freq: Every day | ORAL | Status: DC
  Administered 2023-03-11 – 2023-03-13 (×3): 1000 [IU] via ORAL
  Filled 2023-03-10 (×3): qty 1

## 2023-03-10 MED ORDER — METFORMIN HCL 500 MG PO TABS
500.0000 mg | ORAL_TABLET | Freq: Every day | ORAL | Status: DC
  Administered 2023-03-11 – 2023-03-13 (×3): 500 mg via ORAL
  Filled 2023-03-10 (×3): qty 1

## 2023-03-10 MED ORDER — SODIUM CHLORIDE 0.9 % IV SOLN: INTRAVENOUS | Status: DC

## 2023-03-10 MED ORDER — BISACODYL 5 MG PO TBEC: 5.0000 mg | DELAYED_RELEASE_TABLET | Freq: Every day | ORAL | Status: DC | PRN

## 2023-03-10 MED ORDER — LOSARTAN POTASSIUM 50 MG PO TABS: 100.0000 mg | ORAL_TABLET | Freq: Every day | ORAL | Status: DC

## 2023-03-10 MED ORDER — VITAMIN B-12 1000 MCG PO TABS: 1000.0000 ug | ORAL_TABLET | Freq: Every day | ORAL | Status: DC

## 2023-03-10 MED ORDER — IPRATROPIUM-ALBUTEROL 20-100 MCG/ACT IN AERS: 1.0000 | INHALATION_SPRAY | Freq: Four times a day (QID) | RESPIRATORY_TRACT | Status: DC | PRN

## 2023-03-10 MED ORDER — GABAPENTIN 100 MG PO CAPS
200.0000 mg | ORAL_CAPSULE | Freq: Two times a day (BID) | ORAL | Status: DC
  Administered 2023-03-10 – 2023-03-13 (×6): 200 mg via ORAL
  Filled 2023-03-10 (×6): qty 2

## 2023-03-10 MED ORDER — AMLODIPINE BESYLATE 10 MG PO TABS
10.0000 mg | ORAL_TABLET | Freq: Every day | ORAL | Status: DC
  Administered 2023-03-11 – 2023-03-13 (×3): 10 mg via ORAL
  Filled 2023-03-10 (×3): qty 1

## 2023-03-10 MED ORDER — LORATADINE 10 MG PO TABS
10.0000 mg | ORAL_TABLET | Freq: Every day | ORAL | Status: DC
  Administered 2023-03-11 – 2023-03-13 (×3): 10 mg via ORAL
  Filled 2023-03-10 (×3): qty 1

## 2023-03-10 MED ORDER — HYDROCODONE-ACETAMINOPHEN 5-325 MG PO TABS
0.5000 | ORAL_TABLET | Freq: Once | ORAL | Status: AC
  Administered 2023-03-10: 0.5 via ORAL
  Filled 2023-03-10: qty 1

## 2023-03-10 MED ORDER — LOSARTAN POTASSIUM 50 MG PO TABS
100.0000 mg | ORAL_TABLET | Freq: Every day | ORAL | Status: DC
  Administered 2023-03-11 – 2023-03-13 (×3): 100 mg via ORAL
  Filled 2023-03-10 (×3): qty 2

## 2023-03-10 MED ORDER — CARBOXYMETHYLCELLULOSE SODIUM 0.25 % OP SOLN: 1.0000 [drp] | Freq: Two times a day (BID) | OPHTHALMIC | Status: DC

## 2023-03-10 MED ORDER — LACTATED RINGERS IV BOLUS
1000.0000 mL | Freq: Once | INTRAVENOUS | Status: AC
  Administered 2023-03-10: 1000 mL via INTRAVENOUS

## 2023-03-10 MED ORDER — MELATONIN 3 MG PO TABS
3.0000 mg | ORAL_TABLET | Freq: Every day | ORAL | Status: DC
  Administered 2023-03-10 – 2023-03-12 (×3): 3 mg via ORAL
  Filled 2023-03-10 (×3): qty 1

## 2023-03-10 MED ORDER — SODIUM CHLORIDE 0.9 % IV SOLN
1.0000 g | INTRAVENOUS | Status: DC
  Administered 2023-03-11 – 2023-03-12 (×2): 1 g via INTRAVENOUS
  Filled 2023-03-10 (×2): qty 10

## 2023-03-10 MED ORDER — VITAMIN B-12 1000 MCG PO TABS
1000.0000 ug | ORAL_TABLET | Freq: Every day | ORAL | Status: DC
  Administered 2023-03-10 – 2023-03-12 (×3): 1000 ug via ORAL
  Filled 2023-03-10 (×3): qty 1

## 2023-03-10 MED ORDER — SODIUM CHLORIDE 0.9 % IV SOLN
1.0000 g | Freq: Once | INTRAVENOUS | Status: AC
  Administered 2023-03-10: 1 g via INTRAVENOUS
  Filled 2023-03-10: qty 10

## 2023-03-10 MED ORDER — PANTOPRAZOLE SODIUM 40 MG PO TBEC
40.0000 mg | DELAYED_RELEASE_TABLET | Freq: Every day | ORAL | Status: DC
  Administered 2023-03-11 – 2023-03-13 (×3): 40 mg via ORAL
  Filled 2023-03-10 (×3): qty 1

## 2023-03-10 MED ORDER — FERROUS SULFATE 325 (65 FE) MG PO TABS
325.0000 mg | ORAL_TABLET | ORAL | Status: DC
  Administered 2023-03-11 – 2023-03-13 (×2): 325 mg via ORAL
  Filled 2023-03-10 (×2): qty 1

## 2023-03-10 NOTE — ED Notes (Signed)
ED TO INPATIENT HANDOFF REPORT  Name/Age/Gender [name] 89 y.o. female  Code Status Code Status History     Date Active Date Inactive Code Status Order ID Comments User Context   11/30/2022 2046 12/02/2022 2356 DNR [id number]  Frankey Shown, DO Inpatient   04/10/2022 1109 04/15/2022 1832 Full Code [id number]  Cherie Dark, PA Inpatient   02/17/2022 2031 02/23/2022 1938 DNR [id number]  Zierle-Ghosh, Asia B, DO ED   02/17/2022 1957 02/17/2022 2030 Full Code [id number]  Zierle-Ghosh, Asia B, DO ED   02/25/2021 2246 02/27/2021 2129 Full Code [id number]  Levie Heritage, DO ED   04/03/2020 0141 04/06/2020 1912 Full Code [id number]  Zierle-Ghosh, Asia B, DO ED   03/08/2018 1731 03/12/2018 1835 DNR [id number]  Erick Blinks, MD Inpatient   03/03/2018 0143 03/05/2018 2005 Full Code [id number]  Hillary Bow, DO ED   07/03/2016 0142 07/08/2016 1758 Full Code [id number]  Marlow Baars, MD ED   02/24/2016 1725 02/25/2016 2222 Partial Code [id number]  Meredith Pel, NP ED   06/15/2015 0448 06/21/2015 1628 Full Code [id number]  Hillary Bow, DO ED   12/15/2014 2233 12/17/2014 1716 Partial Code [id number]  Lorretta Harp, MD Inpatient    Questions for Most Recent Historical Code Status (Order [id number])     Question Answer   If patient has no pulse and is not breathing Do Not Attempt Resuscitation   If patient has a pulse and/or is breathing: Medical Treatment Goals LIMITED ADDITIONAL INTERVENTIONS: Use medication/IV fluids and cardiac monitoring as indicated; Do not use intubation or mechanical ventilation (DNI), also provide comfort medications.  Transfer to Progressive/Stepdown as indicated, avoid Intensive Care.   Consent: Discussion documented in EHR or advanced directives reviewed            Home/SNF/Other Home  Chief Complaint Acute lower UTI (urinary tract infection) [N39.0]  Level of Care/Admitting Diagnosis ED Disposition     ED Disposition  Admit   Condition  --   Comment  Hospital  Area: [facility] [[id number]]  Level of Care: Telemetry [5]  Admit to tele based on following criteria: Monitor for Ischemic changes  May admit patient to [facility] or [facility] if equivalent level of care is available:: No  Covid Evaluation: Asymptomatic - no recent exposure (last 10 days) testing not required  Diagnosis: Acute lower UTI (urinary tract infection) [762831]  Admitting Physician: Bobette Mo [[id number]]  Attending Physician: Bobette Mo [[id number]]  Certification:: I certify this patient will need inpatient services for at least 2 midnights  Expected Medical Readiness: 03/12/2023          Medical History Past Medical History:  Diagnosis Date   Cognitive dysfunction 03/04/2019   Patient scores 21 out of 30 on [place] cognitive assessment September 2020   Diabetes mellitus without complication (HCC)    Diabetic peripheral neuropathy associated with type 2 diabetes mellitus (HCC) 02/25/2022   Diverticulitis    Frailty 03/04/2019   H/O bilateral breast reduction surgery    Hyperlipidemia    a. intolerant to statins.    Hypertension     Allergies Allergies  Allergen Reactions   Hydrocortisone Itching   Lisinopril     Other reaction(s): Lethargy (intolerance)   Phenergan [Promethazine Hcl] Other (See Comments)    hallucinations   Statins Other (See Comments)    Severe myalgias   Trazodone And Nefazodone Cough   Prednisone Rash    IV Location/Drains/Wounds Patient Lines/Drains/Airways Status  Active Line/Drains/Airways     Name Placement date Placement time Site Days   Peripheral IV 03/10/23 20 G Anterior;Distal;Right;Upper Arm 03/10/23  1136  Arm  less than 1            Labs/Imaging Results for orders placed or performed during the hospital encounter of 03/10/23 (from the past 48 hour(s))  Urinalysis, Routine w reflex microscopic -Urine, Clean Catch     Status: Abnormal   Collection Time: 03/10/23 11:00 AM  Result  Value Ref Range   Color, Urine STRAW (A) YELLOW   APPearance CLEAR CLEAR   Specific Gravity, Urine 1.006 1.005 - 1.030   pH 6.0 5.0 - 8.0   Glucose, UA NEGATIVE NEGATIVE mg/dL   Hgb urine dipstick NEGATIVE NEGATIVE   Bilirubin Urine NEGATIVE NEGATIVE   Ketones, ur NEGATIVE NEGATIVE mg/dL   Protein, ur 30 (A) NEGATIVE mg/dL   Nitrite NEGATIVE NEGATIVE   Leukocytes,Ua MODERATE (A) NEGATIVE   RBC / HPF 0-5 0 - 5 RBC/hpf   WBC, UA 11-20 0 - 5 WBC/hpf   Bacteria, UA NONE SEEN NONE SEEN   Squamous Epithelial / HPF 0-5 0 - 5 /HPF   Hyaline Casts, UA PRESENT     Comment: Performed at [facility], [address] [place], [place] [address]  CBG monitoring, ED     Status: Abnormal   Collection Time: 03/10/23 11:29 AM  Result Value Ref Range   Glucose-Capillary 161 (H) 70 - 99 mg/dL    Comment: Glucose reference range applies only to samples taken after fasting for at least 8 hours.  CBC     Status: None   Collection Time: 03/10/23 11:35 AM  Result Value Ref Range   WBC 7.1 4.0 - 10.5 K/uL   RBC 4.63 3.87 - 5.11 MIL/uL   Hemoglobin 13.3 12.0 - 15.0 g/dL   HCT 41.3 24.4 - 01.0 %   MCV 88.1 80.0 - 100.0 fL   MCH 28.7 26.0 - 34.0 pg   MCHC 32.6 30.0 - 36.0 g/dL   RDW 27.2 53.6 - 64.4 %   Platelets 164 150 - 400 K/uL   nRBC 0.0 0.0 - 0.2 %    Comment: Performed at [facility], [address] [place], [place] [address]  Comprehensive metabolic panel     Status: Abnormal   Collection Time: 03/10/23 11:35 AM  Result Value Ref Range   Sodium 128 (L) 135 - 145 mmol/L   Potassium 4.0 3.5 - 5.1 mmol/L   Chloride 90 (L) 98 - 111 mmol/L   CO2 26 22 - 32 mmol/L   Glucose, Bld 130 (H) 70 - 99 mg/dL    Comment: Glucose reference range applies only to samples taken after fasting for at least 8 hours.   BUN 18 8 - 23 mg/dL   Creatinine, Ser 2.59 0.44 - 1.00 mg/dL   Calcium 9.5 8.9 - 56.3 mg/dL   Total Protein 7.0 6.5 - 8.1 g/dL   Albumin 4.0 3.5 - 5.0  g/dL   AST 19 15 - 41 U/L   ALT 17 0 - 44 U/L   Alkaline Phosphatase 80 38 - 126 U/L   Total Bilirubin 1.1 0.3 - 1.2 mg/dL   GFR, Estimated >87 >56 mL/min    Comment: (NOTE) Calculated using the CKD-EPI Creatinine Equation (2021)    Anion gap 12 5 - 15    Comment: Performed at [facility], [address] [place], [place] [address]  Troponin  I (High Sensitivity)     Status: None   Collection Time: 03/10/23 11:35 AM  Result Value Ref Range   Troponin I (High Sensitivity) 9 <18 ng/L    Comment: (NOTE) Elevated high sensitivity troponin I (hsTnI) values and significant  changes across serial measurements may suggest ACS but many other  chronic and acute conditions are known to elevate hsTnI results.  Refer to the "Links" section for chest pain algorithms and additional  guidance. Performed at [facility], [address] [place], [place] [address]    No results found.  Pending Labs [employer] (From admission, onward)     Start     Ordered   03/10/23 1241  Urine Culture (for pregnant, neutropenic or urologic patients or patients with an indwelling urinary catheter)  (Urine Labs)  Once,   URGENT       Question:  Indication  Answer:  Dysuria   03/10/23 1240            Vitals/Pain Today's Vitals   03/10/23 1058 03/10/23 1059 03/10/23 1100 03/10/23 1220  BP:   (!) 159/82   Pulse:      Resp:      Temp:   97.7 F (36.5 C)   TempSrc:   Oral   SpO2:      Weight:  164 lb (74.4 kg)    Height:  5\' 8"  (1.727 m)    PainSc: 7    6     Isolation Precautions No active isolations  Medications Medications  cefTRIAXone (ROCEPHIN) 1 g in sodium chloride 0.9 % 100 mL IVPB (has no administration in time range)  0.9 %  sodium chloride infusion (has no administration in time range)  sodium chloride 0.9 % bolus 1,000 mL (has no administration in time range)  lactated ringers bolus 1,000 mL (0 mLs Intravenous Stopped 03/10/23 1252)   HYDROcodone-acetaminophen (NORCO/VICODIN) 5-325 MG per tablet 0.5 tablet (0.5 tablets Oral Given 03/10/23 1221)    Mobility walks

## 2023-03-10 NOTE — H&P (Signed)
History and Physical    Patient: [name]:[id number] DOB: [birth year] DOA: 03/10/2023 DOS: the patient was seen and examined on 03/10/2023 PCP: Babs Sciara, MD  Patient coming from: Home  Chief Complaint:  Chief Complaint  Patient presents with   Weakness   HPI: [name] is a 89 y.o. female with medical history significant of hyperlipidemia, hide retention, type 2 diabetes, diabetic peripheral neuropathy, diverticulosis, diverticulitis, mild cognitive dysfunction history of UTI since last month treated twice with oral antibiotics, seen antipain ED 3 days ago, who came into the emergency department with complaints of frequency, dysuria, lower left flank and suprapubic pain.  No hematuria.  She has been very weak.  She normally walks a mile daily and has being unable to do in the past few days.  She denied fever, chills, rhinorrhea, sore throat, wheezing or hemoptysis.  No chest pain, palpitations, diaphoresis, PND, orthopnea or pitting edema of the lower extremities.  No  nausea, emesis, constipation, melena or hematochezia.  She gets softer loose stools daily.  No polyuria, polydipsia, polyphagia or blurred vision.   Lab work: Urine analysis showed moderate leukocyte esterase, 11-20 WBC and mild proteinuria.  CBC was normal.  Troponin unremarkable.  Sodium 128 and chloride 90 mmol/L.  Glucose 130 mg deciliter, the rest of the CMP measurements were normal.  Imaging: CTA abdomen/pelvis done 3 days ago with no acute finding to explain the patient's symptoms.  There was extensive atherosclerosis of intra-abdominal arteries.  Cholelithiasis without cholecystitis.  Please see full radiology report for further details.  ED course: Initial vital signs were temperature 97.7 F, pulse 105, respiration 18, BP 159/82 mmHg O2 sat 98% on room air.  Patient received ceftriaxone 1 g IVPB, LR 1000 mm bolus and half a tablet of hydrocodone/APAP 5/325 mg.   Review of Systems: As mentioned in the  history of present illness. All other systems reviewed and are negative.  Past Medical History:  Diagnosis Date   Cognitive dysfunction 03/04/2019   Patient scores 21 out of 30 on [place] cognitive assessment September 2020   Diabetes mellitus without complication [facility])    Diabetic peripheral neuropathy associated with type 2 diabetes mellitus (HCC) 02/25/2022   Diverticulitis    Frailty 03/04/2019   H/O bilateral breast reduction surgery    Hyperlipidemia    a. intolerant to statins.    Hypertension    Past Surgical History:  Procedure Laterality Date   ABDOMINAL HYSTERECTOMY  2003   APPENDECTOMY     age 71   BREAST REDUCTION SURGERY     age 76   COLON SURGERY Left 2011   Hemicolectomy due to diverticulitis   EYE SURGERY  03/30/2009   cataract   KNEE SURGERY Right 2003   knee cap   LAPAROSCOPIC INCISIONAL / UMBILICAL / VENTRAL HERNIA REPAIR  02/23/2007   REDUCTION MAMMAPLASTY Bilateral 2001   REFRACTIVE SURGERY     TOTAL KNEE ARTHROPLASTY Right 04/10/2022   Procedure: TOTAL KNEE ARTHROPLASTY;  Surgeon: Eugenia Mcalpine, MD;  Location: [facility];  Service: Orthopedics;  Laterality: Right;  adductor canal   Social History:  reports that she has never smoked. She has never used smokeless tobacco. She reports that she does not drink alcohol and does not use drugs.  Allergies  Allergen Reactions   Hydrocortisone Itching   Lisinopril     Other reaction(s): Lethargy (intolerance)   Phenergan [Promethazine Hcl] Other (See Comments)    hallucinations   Statins Other (See Comments)  Severe myalgias   Trazodone And Nefazodone Cough   Prednisone Rash    Family History  Problem Relation Age of Onset   Cancer Mother        ovaian   Hypertension Father        kidney   Stroke Father    Cancer Father    Hypertension Sister    Diabetes Brother     Prior to Admission medications   Medication Sig Start Date End Date Taking? Authorizing Provider  acetaminophen (PAIN RELIEF EXTRA  STRENGTH) 500 MG tablet TAKE (2) TABLETS BY MOUTH EVERY SIX HOURS A NEEDED FOR PAIN. 01/15/23   Babs Sciara, MD  acetaminophen (TYLENOL) 325 MG tablet TAKE 2 TABLETS(650MG ) BY MOUTH EVERY 6 HOURS AS NEEDED FOR PAIN/TEMPERATURE. Patient taking differently: Take 650 mg by mouth every 6 (six) hours as needed for moderate pain. 08/20/22   Babs Sciara, MD  ALPRAZolam (XANAX) 0.25 MG tablet TAKE (1) TABLET BY MOUTH AT BEDTIME AS NEEDED UNTIL GONE. FOR SHORT-TERM USE ONLY. 02/25/23   Babs Sciara, MD  amLODipine (NORVASC) 10 MG tablet Take 1 tablet (10 mg total) by mouth daily. 12/02/22   Emokpae, Courage, MD  benzonatate (TESSALON) 100 MG capsule TAKE (1) CAPSULE BY MOUTH 3 TIMES DAILY AS NEEDED FOR COUGH. Patient taking differently: Take 100 mg by mouth 3 (three) times daily as needed for cough. 11/21/22   Babs Sciara, MD  BETA CAROTENE PROVITAMIN A 66440 units capsule Take by mouth. 01/01/23   [provider]  beta carotene w/minerals (OCUVITE) tablet Take 1 tablet by mouth at bedtime. Patient taking differently: Take 1 tablet by mouth daily. 07/03/22   Babs Sciara, MD  BISACODYL 5 MG EC tablet TAKE 1 TABLET BY MOUTH ONCE DAILY AS NEEDED FOR MODERATE CONSTIPATION. 12/20/22   Babs Sciara, MD  Carboxymethylcellulose Sodium (THERATEARS) 0.25 % SOLN Apply 1 drop to eye in the morning and at bedtime.    [provider]  COMBIVENT RESPIMAT 20-100 MCG/ACT AERS respimat INHALE (1) PUFF INTO THE LUNGS EVERY SIX HOURS AS NEEDED FOR WHEEZING OR SHORTNESS OF BREATH. Patient taking differently: Inhale 1 puff into the lungs every 6 (six) hours as needed for wheezing or shortness of breath. 11/21/22   Babs Sciara, MD  cyanocobalamin 1000 MCG tablet Take 1,000 mcg by mouth at bedtime.    [provider]  dicyclomine (BENTYL) 10 MG capsule TAKE 1 CAPSULE BY MOUTH TWICE DAILY AS NEEDED FOR ABDOMINAL CRAMPS AND LOOSE STOOL. 11/21/22   Luking, [name], MD  EASYMAX TEST test strip  CHECK BLOOD SUGAR ONCE DAILY.(CALL MD IF BS BELOW 60: OR IF BS ABOVE 400) 02/03/23   Babs Sciara, MD  erythromycin ophthalmic ointment SMARTSIG:In Eye(s) 01/28/23   [provider]  gabapentin (NEURONTIN) 100 MG capsule 2 po bid Patient taking differently: Take 200 mg by mouth 2 (two) times daily. 11/19/22   Babs Sciara, MD  GOODSENSE HEMORRHOIDAL 0.25-14-74.9 % rectal ointment Place 1 Application rectally 3 (three) times daily as needed for irritation. 12/26/21   [provider]  Iron, Ferrous Sulfate, 325 (65 Fe) MG TABS Take 325 mg by mouth every other day. 02/20/23   Babs Sciara, MD  loperamide (ANTI-DIARRHEAL) 2 MG tablet TAKE 1 TABLET BY MOUTH AS NEEDED FOR DIARRHEA OR LOOSE STOOL**MAX 4 TABLETS IN 24 HOURS** 12/27/22   Luking, Scott A, MD  loratadine (CLARITIN) 10 MG tablet Take 10 mg by mouth daily.  [provider]  losartan (COZAAR) 100 MG tablet Take 1 tablet daily 12/02/22   Shon Hale, MD  meclizine (ANTIVERT) 25 MG tablet Take 1 tablet (25 mg total) by mouth 3 (three) times daily as needed for dizziness or nausea. 01/24/23   Babs Sciara, MD  Melatonin 3 MG SUBL TAKE (1) TABLET BY MOUTH AT BEDTIME. Patient taking differently: Take 1 tablet by mouth at bedtime. 05/30/22   Babs Sciara, MD  metFORMIN (GLUCOPHAGE) 500 MG tablet Take one tablet 500mg  po in the morning and 1/2 tablet 250 mg po at supper 12/02/22   Shon Hale, MD  Multiple Vitamins-Minerals (PRESERVISION AREDS 2) CAPS Take 1 capsule by mouth in the morning and at bedtime.    [provider]  NON FORMULARY     [provider]  Omega-3 Fatty Acids (FISH OIL) 1000 MG CAPS One capsule BID 08/07/21   Luking, Scott A, MD  pantoprazole (PROTONIX) 40 MG tablet Take 1 tablet (40 mg total) by mouth daily. 02/11/23   Babs Sciara, MD  prednisoLONE acetate (PRED FORTE) 1 % ophthalmic suspension SMARTSIG:In Eye(s) 01/28/23   [provider]  Vitamin D,  Cholecalciferol, 10 MCG (400 UNIT) TABS TAKE 1 TABLET BY MOUTH ONCE DAILY. 05/12/20   Babs Sciara, MD    Physical Exam: Vitals:   03/10/23 1057 03/10/23 1059 03/10/23 1100  BP:   (!) 159/82  Pulse: (!) 105    Resp: 18    Temp:   97.7 F (36.5 C)  TempSrc:   Oral  SpO2: 98%    Weight:  74.4 kg   Height:  5\' 8"  (1.727 m)    Physical Exam Vitals and nursing note reviewed.  Constitutional:      General: She is awake. She is not in acute distress.    Appearance: Normal appearance. She is normal weight. She is ill-appearing. She is not toxic-appearing.     Comments: Frail, elderly female.  HENT:     Head: Normocephalic.     Nose: No rhinorrhea.     Mouth/Throat:     Mouth: Mucous membranes are moist.  Eyes:     General: No scleral icterus.    Pupils: Pupils are equal, round, and reactive to light.  Neck:     Vascular: No JVD.  Cardiovascular:     Rate and Rhythm: Normal rate.     Heart sounds: S1 normal and S2 normal.  Pulmonary:     Effort: Pulmonary effort is normal.     Breath sounds: Normal breath sounds. No wheezing, rhonchi or rales.  Abdominal:     General: Bowel sounds are normal.     Palpations: Abdomen is soft.     Tenderness: There is abdominal tenderness in the suprapubic area and left lower quadrant. There is left CVA tenderness. There is no right CVA tenderness, guarding or rebound.  Musculoskeletal:     Cervical back: Neck supple.     Right lower leg: No edema.     Left lower leg: No edema.  Skin:    General: Skin is warm and dry.  Neurological:     General: No focal deficit present.     Mental Status: She is alert and oriented to person, place, and time.  Psychiatric:        Mood and Affect: Mood normal.        Behavior: Behavior normal. Behavior is cooperative.     Data Reviewed:  Results are pending, will review when  available.  01/23/2022 transthoracic echocardiogram. IMPRESSIONS:   1. Left ventricular ejection fraction, by estimation,  is 60 to 65%. The left ventricle has normal function. The left ventricle has no regional wall motion abnormalities. Left ventricular diastolic parameters were normal.  2. Right ventricular systolic function is normal. The right ventricular size is normal.  3. The mitral valve is normal in structure. No evidence of mitral valve regurgitation. No evidence of mitral stenosis.  4. The aortic valve is tricuspid. There is mild calcification of the aortic valve. There is mild thickening of the aortic valve. Aortic valve regurgitation is not visualized. Aortic valve sclerosis is present, with no evidence of aortic valve stenosis.  5. The inferior vena cava is normal in size with greater than 50% respiratory variability, suggesting right atrial pressure of 3 mmHg.   EKG: Vent. rate 91 BPM PR interval 192 ms QRS duration 100 ms QT/QTcB 354/436 ms P-R-T axes 45 23 90 Sinus rhythm new Multiple ventricular premature complexes Abnormal lateral Q waves Anterior infarct, old  Assessment and Plan: Principal Problem:   Acute lower UTI (urinary tract infection) Admit to telemetry/inpatient. Continue ceftriaxone 1 g IVPB daily. Analgesics as needed. Antiemetics as needed. Follow-up urine culture and sensitivity. Follow-up CBC and chemistry in the morning. -Would like to see Dr. Retta Diones in [place], [place] as an outpatient. -Will try to obtain this appointment given her recent frequent UTIs  Active Problems:   Essential hypertension Continue amlodipine 10 mg p.o. daily. Continue losartan 100 mg p.o. daily.    Hyponatremia Forces herself to drink 32 ounces of water every morning. Also has loose stools daily after regular bowel movement. Advised to only drink water when feeling thirsty. Fluid restriction while in the hospital. Follow-up sodium level.    GERD (gastroesophageal reflux disease) Continue pantoprazole 40 mg p.o. daily.    Aortic atherosclerosis (HCC) Allergic to  statins. Would not want any procedures for this. Outpatient vascular surgery evaluation may be beneficial.    Diabetic neuropathy (HCC) Continue gabapentin 200 mg p.o. twice daily.    Type 2 diabetes mellitus (HCC) Carbohydrate modified diet. Continue metformin twice daily. CBG monitoring with RI SS. Check hemoglobin A1c.    Advance Care Planning:   Code Status: Do not attempt resuscitation (DNR) PRE-ARREST INTERVENTIONS DESIRED   Consults:   Family Communication: Her son was at bedside.  Severity of Illness: The appropriate patient status for this patient is INPATIENT. Inpatient status is judged to be reasonable and necessary in order to provide the required intensity of service to ensure the patient's safety. The patient's presenting symptoms, physical exam findings, and initial radiographic and laboratory data in the context of their chronic comorbidities is felt to place them at high risk for further clinical deterioration. Furthermore, it is not anticipated that the patient will be medically stable for discharge from the hospital within 2 midnights of admission.   * I certify that at the point of admission it is my clinical judgment that the patient will require inpatient hospital care spanning beyond 2 midnights from the point of admission due to high intensity of service, high risk for further deterioration and high frequency of surveillance required.*  Author: Bobette Mo, MD 03/10/2023 1:05 PM  For on call review [email or web address].   This document was prepared using Dragon voice recognition software and may contain some unintended transcription errors.

## 2023-03-10 NOTE — ED Provider Notes (Signed)
[place] EMERGENCY DEPARTMENT AT [facility] Provider Note   CSN: [id number] Arrival date & time: 03/10/23  1052     History  Chief Complaint  Patient presents with   Weakness    [name] is a 89 y.o. female.  Patient is an 89 year old female with a history of diabetes, hypertension, hyperlipidemia, prior colectomy for recurrent diverticulitis, appendectomy and hysterectomy who is presenting today with ongoing symptoms for the last 2 to 3 weeks.  She reports that she just feels generally unwell she has had generalized weakness and feels that she cannot complete her activities of daily living.  The symptoms have been gradually worsening and became worse on Friday prompting a visit to [facility] emergency room.  At that time she had CTA of her abdomen and pelvis, blood work done.  She reports that they did not really tell her much but told her she was okay she needed to follow-up with the vascular surgeon was discharged home.  She reports since going back she is gradually felt worse.  She is having worse pain on her left flank down into her left lower quadrant as well as some mild pain in her right lower quadrant and urinary frequency urgency and occasional dysuria.  She reports today she went to the bathroom at least 10 times before she came.  Bowel movements have seemed fairly normal but she reports she will have a normal sized stool and then shortly after will have a loose stool which is new over the last 2 to 3 weeks as well.  She has no known fevers, cough, congestion.  No new medications other than iron.  The history is provided by the patient.  Weakness      Home Medications Prior to Admission medications   Medication Sig Start Date End Date Taking? Authorizing Provider  acetaminophen (PAIN RELIEF EXTRA STRENGTH) 500 MG tablet TAKE (2) TABLETS BY MOUTH EVERY SIX HOURS A NEEDED FOR PAIN. 01/15/23   Babs Sciara, MD  acetaminophen (TYLENOL) 325 MG tablet TAKE 2  TABLETS(650MG ) BY MOUTH EVERY 6 HOURS AS NEEDED FOR PAIN/TEMPERATURE. Patient taking differently: Take 650 mg by mouth every 6 (six) hours as needed for moderate pain. 08/20/22   Babs Sciara, MD  ALPRAZolam (XANAX) 0.25 MG tablet TAKE (1) TABLET BY MOUTH AT BEDTIME AS NEEDED UNTIL GONE. FOR SHORT-TERM USE ONLY. 02/25/23   Babs Sciara, MD  amLODipine (NORVASC) 10 MG tablet Take 1 tablet (10 mg total) by mouth daily. 12/02/22   Emokpae, Courage, MD  benzonatate (TESSALON) 100 MG capsule TAKE (1) CAPSULE BY MOUTH 3 TIMES DAILY AS NEEDED FOR COUGH. Patient taking differently: Take 100 mg by mouth 3 (three) times daily as needed for cough. 11/21/22   Babs Sciara, MD  BETA CAROTENE PROVITAMIN A 86578 units capsule Take by mouth. 01/01/23   [provider]  beta carotene w/minerals (OCUVITE) tablet Take 1 tablet by mouth at bedtime. Patient taking differently: Take 1 tablet by mouth daily. 07/03/22   Babs Sciara, MD  BISACODYL 5 MG EC tablet TAKE 1 TABLET BY MOUTH ONCE DAILY AS NEEDED FOR MODERATE CONSTIPATION. 12/20/22   Babs Sciara, MD  Carboxymethylcellulose Sodium (THERATEARS) 0.25 % SOLN Apply 1 drop to eye in the morning and at bedtime.    [provider]  COMBIVENT RESPIMAT 20-100 MCG/ACT AERS respimat INHALE (1) PUFF INTO THE LUNGS EVERY SIX HOURS AS NEEDED FOR WHEEZING OR SHORTNESS OF BREATH. Patient taking differently: Inhale  1 puff into the lungs every 6 (six) hours as needed for wheezing or shortness of breath. 11/21/22   Babs Sciara, MD  cyanocobalamin 1000 MCG tablet Take 1,000 mcg by mouth at bedtime.    [provider]  dicyclomine (BENTYL) 10 MG capsule TAKE 1 CAPSULE BY MOUTH TWICE DAILY AS NEEDED FOR ABDOMINAL CRAMPS AND LOOSE STOOL. 11/21/22   Luking, [name], MD  EASYMAX TEST test strip CHECK BLOOD SUGAR ONCE DAILY.(CALL MD IF BS BELOW 60: OR IF BS ABOVE 400) 02/03/23   Babs Sciara, MD  erythromycin ophthalmic ointment SMARTSIG:In Eye(s) 01/28/23    [provider]  gabapentin (NEURONTIN) 100 MG capsule 2 po bid Patient taking differently: Take 200 mg by mouth 2 (two) times daily. 11/19/22   Babs Sciara, MD  GOODSENSE HEMORRHOIDAL 0.25-14-74.9 % rectal ointment Place 1 Application rectally 3 (three) times daily as needed for irritation. 12/26/21   [provider]  Iron, Ferrous Sulfate, 325 (65 Fe) MG TABS Take 325 mg by mouth every other day. 02/20/23   Babs Sciara, MD  loperamide (ANTI-DIARRHEAL) 2 MG tablet TAKE 1 TABLET BY MOUTH AS NEEDED FOR DIARRHEA OR LOOSE STOOL**MAX 4 TABLETS IN 24 HOURS** 12/27/22   Luking, Scott A, MD  loratadine (CLARITIN) 10 MG tablet Take 10 mg by mouth daily.    [provider]  losartan (COZAAR) 100 MG tablet Take 1 tablet daily 12/02/22   Shon Hale, MD  meclizine (ANTIVERT) 25 MG tablet Take 1 tablet (25 mg total) by mouth 3 (three) times daily as needed for dizziness or nausea. 01/24/23   Babs Sciara, MD  Melatonin 3 MG SUBL TAKE (1) TABLET BY MOUTH AT BEDTIME. Patient taking differently: Take 1 tablet by mouth at bedtime. 05/30/22   Babs Sciara, MD  metFORMIN (GLUCOPHAGE) 500 MG tablet Take one tablet 500mg  po in the morning and 1/2 tablet 250 mg po at supper 12/02/22   Shon Hale, MD  Multiple Vitamins-Minerals (PRESERVISION AREDS 2) CAPS Take 1 capsule by mouth in the morning and at bedtime.    [provider]  NON FORMULARY     [provider]  Omega-3 Fatty Acids (FISH OIL) 1000 MG CAPS One capsule BID 08/07/21   Luking, Scott A, MD  pantoprazole (PROTONIX) 40 MG tablet Take 1 tablet (40 mg total) by mouth daily. 02/11/23   Babs Sciara, MD  prednisoLONE acetate (PRED FORTE) 1 % ophthalmic suspension SMARTSIG:In Eye(s) 01/28/23   [provider]  Vitamin D, Cholecalciferol, 10 MCG (400 UNIT) TABS TAKE 1 TABLET BY MOUTH ONCE DAILY. 05/12/20   Babs Sciara, MD      Allergies    Hydrocortisone, Lisinopril, Phenergan  [promethazine hcl], Statins, Trazodone and nefazodone, and Prednisone    Review of Systems   Review of Systems  Neurological:  Positive for weakness.    Physical Exam Updated Vital Signs BP (!) 159/82   Pulse (!) 105   Temp 97.7 F (36.5 C) (Oral)   Resp 18   Ht 5\' 8"  (1.727 m)   Wt 74.4 kg   SpO2 98%   BMI 24.94 kg/m  Physical Exam Vitals and nursing note reviewed.  Constitutional:      General: She is not in acute distress.    Appearance: She is well-developed.  HENT:     Head: Normocephalic and atraumatic.  Eyes:     Pupils: Pupils are equal, round, and reactive to light.  Cardiovascular:  Rate and Rhythm: Normal rate and regular rhythm.     Pulses: Normal pulses.     Heart sounds: Normal heart sounds. No murmur heard.    No friction rub.  Pulmonary:     Effort: Pulmonary effort is normal.     Breath sounds: Normal breath sounds. No wheezing or rales.  Abdominal:     General: Bowel sounds are normal. There is no distension.     Palpations: Abdomen is soft.     Tenderness: There is abdominal tenderness in the right lower quadrant and left lower quadrant. There is left CVA tenderness and guarding. There is no rebound.  Musculoskeletal:        General: No tenderness. Normal range of motion.     Right lower leg: No edema.     Left lower leg: No edema.     Comments: No edema  Skin:    General: Skin is warm and dry.     Findings: No rash.  Neurological:     Mental Status: She is alert and oriented to person, place, and time. Mental status is at baseline.     Cranial Nerves: No cranial nerve deficit.  Psychiatric:        Behavior: Behavior normal.     ED Results / Procedures / Treatments   Labs (all labs ordered are listed, but only abnormal results are displayed) Labs Reviewed  URINALYSIS, ROUTINE W REFLEX MICROSCOPIC - Abnormal; Notable for the following components:      Result Value   Color, Urine STRAW (*)    Protein, ur 30 (*)    Leukocytes,Ua  MODERATE (*)    All other components within normal limits  COMPREHENSIVE METABOLIC PANEL - Abnormal; Notable for the following components:   Sodium 128 (*)    Chloride 90 (*)    Glucose, Bld 130 (*)    All other components within normal limits  CBG MONITORING, ED - Abnormal; Notable for the following components:   Glucose-Capillary 161 (*)    All other components within normal limits  URINE CULTURE  CBC  TROPONIN I (HIGH SENSITIVITY)    EKG EKG Interpretation Date/Time:  Monday March 10 2023 11:02:42 EDT Ventricular Rate:  91 PR Interval:  192 QRS Duration:  100 QT Interval:  354 QTC Calculation: 436 R Axis:   23  Text Interpretation: Sinus rhythm new Multiple ventricular premature complexes Abnormal lateral Q waves Anterior infarct, old Confirmed by Gwyneth Sprout ([id number]) on 03/10/2023 11:19:17 AM  Radiology No results found.  Procedures Procedures    Medications Ordered in ED Medications  cefTRIAXone (ROCEPHIN) 1 g in sodium chloride 0.9 % 100 mL IVPB (has no administration in time range)  0.9 %  sodium chloride infusion (has no administration in time range)  sodium chloride 0.9 % bolus 1,000 mL (has no administration in time range)  lactated ringers bolus 1,000 mL (1,000 mLs Intravenous New Bag/Given 03/10/23 1221)  HYDROcodone-acetaminophen (NORCO/VICODIN) 5-325 MG per tablet 0.5 tablet (0.5 tablets Oral Given 03/10/23 1221)    ED Course/ Medical Decision Making/ A&P                                 Medical Decision Making Amount and/or Complexity of Data Reviewed Labs: ordered. Decision-making details documented in ED Course. ECG/medicine tests: ordered and independent interpretation performed. Decision-making details documented in ED Course.  Risk Prescription drug management.   Pt with multiple medical problems and comorbidities  and presenting today with a complaint that caries a high risk for morbidity and mortality.  Patient is presenting today with  multiple complaints.  Mostly of abdominal pain and urinary symptoms.  Concern for pyelonephritis versus bowel obstruction versus diverticulitis versus abdominal pain from adhesions versus kidney stone.  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  Patient's thyroid has been checked within the last few months and was normal.  Lab work from 3 days ago was normal.  Patient reports in the last 3 days she is had a significant decreased appetite as well.  Concern for dehydration, new AKI or electrolyte abnormalities.  Concern for possible developing urinary tract infection possible pyelonephritis.  Labs pending.  Patient given pain control and fluids.  12:48 PM I independently interpreted patient's labs and CBC remains normal but CMP today with new hyponatremia with a sodium of 128 from 133, normal renal function and liver testing, troponin within normal limits, UA today with moderate leukocytes, 11-20 white cells but still no bacteria seen.  However this is worse than urine done 3 days ago that had trace leukocytes and 6-10 white cells.  Suspect a developing urinary tract infection which is led to poor oral intake and hyponatremia which is what is causing patient to feel weak.  Urine culture is pending.  Patient given dose of IV Rocephin.  Last urine culture was done years ago and at that time was positive for Proteus which was pansensitive except to Macrobid.  When discussing this with the patient at this point she does not feel safe going home as she has become so weak and is not eating now.  Will admit to the hospitalist service for fluids antibiotics and to ensure patient is feeling better prior to discharge.         Final Clinical  Impression(s) / ED Diagnoses Final diagnoses:  Weakness  Hyponatremia  Acute cystitis without hematuria    Rx / DC Orders ED Discharge Orders     None         Gwyneth Sprout, MD 03/10/23 1249

## 2023-03-10 NOTE — ED Triage Notes (Addendum)
Patient stated she has been feeling weak for over a week. Patient said "I can't function and walk like I normally do." Complaining of left lower abdominal pain that wraps around to her left back. Patient said she feels like she wants to pass out.

## 2023-03-11 DIAGNOSIS — I10 Essential (primary) hypertension: Secondary | ICD-10-CM | POA: Diagnosis not present

## 2023-03-11 DIAGNOSIS — N390 Urinary tract infection, site not specified: Secondary | ICD-10-CM | POA: Diagnosis not present

## 2023-03-11 DIAGNOSIS — E1349 Other specified diabetes mellitus with other diabetic neurological complication: Secondary | ICD-10-CM

## 2023-03-11 DIAGNOSIS — E871 Hypo-osmolality and hyponatremia: Secondary | ICD-10-CM | POA: Diagnosis not present

## 2023-03-11 LAB — URINE CULTURE: Culture: <10000 — AB

## 2023-03-11 LAB — GLUCOSE, CAPILLARY
Glucose-Capillary: 127 mg/dL — ABNORMAL HIGH (ref 70–99)
Glucose-Capillary: 105 mg/dL — ABNORMAL HIGH (ref 70–99)
Glucose-Capillary: 138 mg/dL — ABNORMAL HIGH (ref 70–99)
Glucose-Capillary: 151 mg/dL — ABNORMAL HIGH (ref 70–99)

## 2023-03-11 MED ORDER — LIDOCAINE 5 % EX PTCH
1.0000 | MEDICATED_PATCH | CUTANEOUS | Status: AC
  Administered 2023-03-11 – 2023-03-12 (×2): 1 via TRANSDERMAL
  Filled 2023-03-11 (×2): qty 1

## 2023-03-11 NOTE — Evaluation (Signed)
Physical Therapy Evaluation-1x Patient Details Name: ARAINA BOZEMAN MRN: [id number] DOB: 1932/05/25 Today's Date: 03/11/2023  History of Present Illness  Pt is an 89 year old female who presented with frequency, dysuria, lower left flank and suprapubic pain. Patient was admitted with acute lower UTI, essential hypertension, hyponatremia, PMH: R TKA, cognitive dysfunction, DM with peripheral neuropathy, HLD, HTN, PNA, GERD,  Clinical Impression  On eval, pt was Mod Ind with mobility. She walked ~300 feet with a RW. Pt tolerated session well. Very active at baseline. No acute PT needs. 1x eval. Will sign off.         If plan is discharge home, recommend the following:     Can travel by private vehicle        Equipment Recommendations None recommended by PT  Recommendations for Other Services       Functional Status Assessment Patient has not had a recent decline in their functional status     Precautions / Restrictions Precautions Precautions: None Restrictions Weight Bearing Restrictions: No Other Position/Activity Restrictions: personal knee brace R knee      Mobility  Bed Mobility Overal bed mobility: Modified Independent                  Transfers Overall transfer level: Modified independent                      Ambulation/Gait Ambulation/Gait assistance: Modified independent (Device/Increase time) Gait Distance (Feet): 300 Feet Assistive device: Rolling walker (2 wheels) Gait Pattern/deviations: Step-through pattern          Stairs            Wheelchair Mobility     Tilt Bed    Modified Rankin (Stroke Patients Only)       Balance Overall balance assessment: Modified Independent                                           Pertinent Vitals/Pain Pain Assessment Pain Assessment: No/denies pain    Home Living Family/patient expects to be discharged to:: Assisted living                 Home  Equipment: Agricultural consultant (2 wheels)      Prior Function Prior Level of Function : Independent/Modified Independent             Mobility Comments: walks up to a mild a day with use of RW at ALF ADLs Comments: Supervised by ALF staff     Extremity/Trunk Assessment   Upper Extremity Assessment Upper Extremity Assessment: Defer to OT evaluation    Lower Extremity Assessment Lower Extremity Assessment: Overall WFL for tasks assessed    Cervical / Trunk Assessment Cervical / Trunk Assessment: Normal  Communication   Communication Communication: No apparent difficulties  Cognition Arousal: Alert Behavior During Therapy: WFL for tasks assessed/performed Overall Cognitive Status: Within Functional Limits for tasks assessed                                 General Comments: very nice, pleasant lady        General Comments      Exercises     Assessment/Plan    PT Assessment Patient does not need any further PT services  PT Problem List  PT Treatment Interventions      PT Goals (Current goals can be found in the Care Plan section)  Acute Rehab PT Goals Patient Stated Goal: home PT Goal Formulation: All assessment and education complete, DC therapy    Frequency       Co-evaluation               AM-PAC PT "6 Clicks" Mobility  Outcome Measure Help needed turning from your back to your side while in a flat bed without using bedrails?: None Help needed moving from lying on your back to sitting on the side of a flat bed without using bedrails?: None Help needed moving to and from a bed to a chair (including a wheelchair)?: None Help needed standing up from a chair using your arms (e.g., wheelchair or bedside chair)?: None Help needed to walk in hospital room?: None Help needed climbing 3-5 steps with a railing? : A Little 6 Click Score: 23    End of Session   Activity Tolerance: Patient tolerated treatment well Patient left: in  bed;with call bell/phone within reach        Time: 1432-1504 PT Time Calculation (min) (ACUTE ONLY): 32 min   Charges:   PT Evaluation $PT Eval Low Complexity: 1 Low PT Treatments $Gait Training: 8-22 mins PT General Charges $$ ACUTE PT VISIT: 1 Visit           Faye Ramsay, PT Acute Rehabilitation  Office: [phone]

## 2023-03-11 NOTE — Plan of Care (Signed)
  Problem: Health Behavior/Discharge Planning: Goal: Ability to manage health-related needs will improve Outcome: Progressing   Problem: Clinical Measurements: Goal: Ability to maintain clinical measurements within normal limits will improve Outcome: Progressing Goal: Will remain free from infection Outcome: Progressing Goal: Respiratory complications will improve Outcome: Progressing   Problem: Activity: Goal: Risk for activity intolerance will decrease Outcome: Progressing   Problem: Coping: Goal: Level of anxiety will decrease Outcome: Progressing   Problem: Elimination: Goal: Will not experience complications related to urinary retention Outcome: Progressing   Problem: Pain Managment: Goal: General experience of comfort will improve Outcome: Progressing   Problem: Skin Integrity: Goal: Risk for impaired skin integrity will decrease Outcome: Progressing

## 2023-03-11 NOTE — Evaluation (Signed)
Occupational Therapy Evaluation Patient Details Name: [name] MRN: [id number] DOB: 1931/09/22 Today's Date: 03/11/2023   History of Present Illness Pt is an 89 year old female who presented with frequency, dysuria, lower left flank and suprapubic pain. Patient was admitted with acute lower UTI, essential hypertension, hyponatremia, PMH: R TKA, cognitive dysfunction, DM with peripheral neuropathy, HLD, HTN, PNA, GERD,   Clinical Impression   Patient evaluated by Occupational Therapy with no further acute OT needs identified. All education has been completed and the patient has no further questions. Patient is MI with RW for ADLs. Patient needs supervision when furniture walking with mild unsteadiness with continued education on keeping RW with her.  See below for any follow-up Occupational Therapy or equipment needs. OT is signing off. Thank you for this referral.        If plan is discharge home, recommend the following:      Functional Status Assessment  Patient has not had a recent decline in their functional status  Equipment Recommendations  None recommended by OT       Precautions / Restrictions Restrictions Weight Bearing Restrictions: No Other Position/Activity Restrictions: personal knee brace R knee      Mobility Bed Mobility Overal bed mobility: Modified Independent                  Transfers Overall transfer level: Modified independent                        Balance Overall balance assessment: Modified Independent         ADL either performed or assessed with clinical judgement   ADL Overall ADL's : Modified independent         General ADL Comments: at RW level with education on using RW all times v.s. furniture walking in room ect. patient verbalized understanding. patient was MI with LB dressing, transfers, functional mobility and bed mobility during session.      Pertinent Vitals/Pain Pain Assessment Pain Assessment:  0-10 Pain Score: 0-No pain     Extremity/Trunk Assessment Upper Extremity Assessment Upper Extremity Assessment: Overall WFL for tasks assessed           Communication Communication Communication: No apparent difficulties   Cognition Arousal: Alert Behavior During Therapy: WFL for tasks assessed/performed Overall Cognitive Status: Within Functional Limits for tasks assessed                                                  Home Living Family/patient expects to be discharged to:: Assisted living                             Home Equipment: Rolling Walker (2 wheels)          Prior Functioning/Environment Prior Level of Function : Needs assist             Mobility Comments: household ambulator using RW PRN, can ambulate very short household distance furniture walking ADLs Comments: Supervised by ALF staff        OT Problem List:        OT Treatment/Interventions:      OT Goals(Current goals can be found in the care plan section) Acute Rehab OT Goals OT Goal Formulation: All assessment and education complete, DC therapy  OT Frequency:         AM-PAC OT "6 Clicks" Daily Activity     Outcome Measure Help from another person eating meals?: None Help from another person taking care of personal grooming?: None Help from another person toileting, which includes using toliet, bedpan, or urinal?: None Help from another person bathing (including washing, rinsing, drying)?: None Help from another person to put on and taking off regular upper body clothing?: None Help from another person to put on and taking off regular lower body clothing?: None 6 Click Score: 24   End of Session Equipment Utilized During Treatment: Rolling walker (2 wheels)  Activity Tolerance: Patient tolerated treatment well Patient left: in bed;with call bell/phone within reach  OT Visit Diagnosis: Unsteadiness on feet (R26.81)                Time:  1610-9604 OT Time Calculation (min): 21 min Charges:  OT General Charges $OT Visit: 1 Visit OT Evaluation $OT Eval Low Complexity: 1 Low  Abriel Geesey OTR/L, MS Acute Rehabilitation Department Office# [phone]   [name] 03/11/2023, 10:35 AM

## 2023-03-11 NOTE — Progress Notes (Signed)
[facility]                                                                               [name], is a 89 y.o. female, DOB - 1932/05/08, MWU:[id number] Admit date - 03/10/2023    Outpatient Primary MD for the patient is Luking, [name], MD  LOS - 1  days    Brief summary    89 y.o. female with medical history significant of hyperlipidemia, Hypertension, type 2 diabetes, diabetic peripheral neuropathy, diverticulosis, diverticulitis, mild cognitive dysfunction history of UTI since last month treated twice with oral antibiotics, seen antipain ED 3 days ago, who came into the emergency department with complaints of frequency, dysuria, lower left flank and suprapubic pain.. CTA abdomen/pelvis done 3 days ago with no acute finding to explain the patient's symptoms. There was extensive atherosclerosis of intra-abdominal arteries. Cholelithiasis without cholecystitis.   She was admitted for UTI.   Assessment & Plan    Assessment and Plan:  ACUTE LOWER UTI Follow up urine cultures .  Continue with IV fluids and IV ceftriaxone.  Pain better,    Hyponatremia:  Possibly from dehydration .  Sodium of 128.   Type 2 DM  CBG (last 3)  Recent Labs    03/10/23 2155 03/11/23 0739 03/11/23 1204  GLUCAP 114* 127* 105*   Continue with metformin.    Hypertension:  Well controlled. Continue with metformin and norvasc 10 mg daily.    Peripheral neuropathy;  Resume gabapentin.      Estimated body mass index is 24.94 kg/m as calculated from the following:   Height as of this encounter: 5\' 8"  (1.727 m).   Weight as of this encounter: 74.4 kg.  Code Status: DNR- No CPR or chest compressions, May intubate, use advanced airway interventions and cardioversion/ACLS medications if appropriate or indicated. May transfer to ICU.  DVT Prophylaxis:  enoxaparin (LOVENOX) injection 40 mg Start: 03/10/23 2200   Level of Care: Level of care: Telemetry Family  Communication: none at bedside.   Disposition Plan:     Remains inpatient appropriate:  IV antibiotics.   Procedures:  None.   Consultants:   None.   Antimicrobials:   Anti-infectives (From admission, onward)    Start     Dose/Rate Route Frequency Ordered Stop   03/11/23 1400  cefTRIAXone (ROCEPHIN) 1 g in sodium chloride 0.9 % 100 mL IVPB        1 g 200 mL/hr over 30 Minutes Intravenous Every 24 hours 03/10/23 1624     03/10/23 1245  cefTRIAXone (ROCEPHIN) 1 g in sodium chloride 0.9 % 100 mL IVPB        1 g 200 mL/hr over 30 Minutes Intravenous  Once 03/10/23 1241 03/10/23 1456        Medications  Scheduled Meds:  amLODipine  10 mg Oral Daily   cholecalciferol  1,000 Units Oral Daily   vitamin B-12  1,000 mcg Oral QHS   enoxaparin (LOVENOX) injection  40 mg Subcutaneous Q24H   ferrous sulfate  325 mg Oral QODAY   gabapentin  200 mg Oral BID   lidocaine  1 patch Transdermal Q24H   loratadine  10  mg Oral Daily   losartan  100 mg Oral Daily   melatonin  3 mg Oral QHS   metFORMIN  250 mg Oral Q supper   metFORMIN  500 mg Oral Q breakfast   pantoprazole  40 mg Oral Daily   polyvinyl alcohol  1 drop Both Eyes BID   Continuous Infusions:  cefTRIAXone (ROCEPHIN)  IV     PRN Meds:.ALPRAZolam, bisacodyl, ipratropium-albuterol, oxyCODONE    Subjective:   [name] was seen and examined today.  Reports feeling better today. No chest pain or sob  Objective:   Vitals:   03/10/23 1330 03/10/23 1531 03/10/23 2153 03/11/23 0330  BP: (!) 189/76 (!) 171/61 (!) 172/58 138/61  Pulse: 77 (!) 59 63 62  Resp: 17 20 18    Temp:  (!) 97.3 F (36.3 C) 97.8 F (36.6 C) 97.7 F (36.5 C)  TempSrc:  Oral Oral Oral  SpO2: 100% 97% 94% 93%  Weight:      Height:        Intake/Output Summary (Last 24 hours) at 03/11/2023 1311 Last data filed at 03/11/2023 1001 Gross per 24 hour  Intake 240 ml  Output --  Net 240 ml   Filed Weights   03/10/23 1059  Weight: 74.4 kg      Exam General exam: Appears calm and comfortable  Respiratory system: Clear to auscultation. Respiratory effort normal. Cardiovascular system: S1 & S2 heard, RRR. No JVD,  Gastrointestinal system: Abdomen is nondistended, soft and nontender.  Central nervous system: Alert and oriented. No focal neurological deficits. Extremities: Symmetric 5 x 5 power. Skin: No rashes,  Psychiatry: Mood & affect appropriate.    Data Reviewed:  I have personally reviewed following labs and imaging studies   CBC Lab Results  Component Value Date   WBC 7.1 03/10/2023   RBC 4.63 03/10/2023   HGB 13.3 03/10/2023   HCT 40.8 03/10/2023   MCV 88.1 03/10/2023   MCH 28.7 03/10/2023   PLT 164 03/10/2023   MCHC 32.6 03/10/2023   RDW 13.5 03/10/2023   LYMPHSABS 2.3 02/11/2023   MONOABS 0.6 01/25/2023   EOSABS 0.1 02/11/2023   BASOSABS 0.0 02/11/2023     Last metabolic panel Lab Results  Component Value Date   NA 128 (L) 03/10/2023   K 4.0 03/10/2023   CL 90 (L) 03/10/2023   CO2 26 03/10/2023   BUN 18 03/10/2023   CREATININE 0.76 03/10/2023   GLUCOSE 130 (H) 03/10/2023   GFRNONAA >60 03/10/2023   GFRAA 69 06/06/2020   CALCIUM 9.5 03/10/2023   PHOS 3.5 12/01/2022   PROT 7.0 03/10/2023   ALBUMIN 4.0 03/10/2023   LABGLOB 2.1 11/30/2020   AGRATIO 2.2 11/30/2020   BILITOT 1.1 03/10/2023   ALKPHOS 80 03/10/2023   AST 19 03/10/2023   ALT 17 03/10/2023   ANIONGAP 12 03/10/2023    CBG (last 3)  Recent Labs    03/10/23 2155 03/11/23 0739 03/11/23 1204  GLUCAP 114* 127* 105*      Coagulation Profile: No results for input(s): "INR", "PROTIME" in the last 168 hours.   Radiology Studies: No results found.     Kathlen Mody M.D. [facility] 03/11/2023, 1:11 PM  Available via Epic secure chat 7am-7pm After 7 pm, please refer to night coverage provider listed on amion.

## 2023-03-12 DIAGNOSIS — I10 Essential (primary) hypertension: Secondary | ICD-10-CM | POA: Diagnosis not present

## 2023-03-12 DIAGNOSIS — E871 Hypo-osmolality and hyponatremia: Secondary | ICD-10-CM | POA: Diagnosis not present

## 2023-03-12 DIAGNOSIS — E1349 Other specified diabetes mellitus with other diabetic neurological complication: Secondary | ICD-10-CM | POA: Diagnosis not present

## 2023-03-12 DIAGNOSIS — N390 Urinary tract infection, site not specified: Secondary | ICD-10-CM | POA: Diagnosis not present

## 2023-03-12 LAB — IRON AND TIBC
Iron: 37 ug/dL (ref 28–170)
Saturation Ratios: 10 % — ABNORMAL LOW (ref 10.4–31.8)
TIBC: 375 ug/dL (ref 250–450)
UIBC: 338 ug/dL

## 2023-03-12 LAB — BASIC METABOLIC PANEL
Anion gap: 11 (ref 5–15)
BUN: 20 mg/dL (ref 8–23)
CO2: 28 mmol/L (ref 22–32)
Calcium: 9.8 mg/dL (ref 8.9–10.3)
Chloride: 96 mmol/L — ABNORMAL LOW (ref 98–111)
Creatinine, Ser: 0.81 mg/dL (ref 0.44–1.00)
GFR, Estimated: >60 mL/min (ref >60)
Glucose, Bld: 122 mg/dL — ABNORMAL HIGH (ref 70–99)
Potassium: 4.5 mmol/L (ref 3.5–5.1)
Sodium: 135 mmol/L (ref 135–145)

## 2023-03-12 LAB — CBC WITH DIFFERENTIAL/PLATELET
Abs Immature Granulocytes: 0.02 10*3/uL (ref 0.00–0.07)
Basophils Absolute: 0 10*3/uL (ref 0.0–0.1)
Basophils Relative: 0 %
Eosinophils Absolute: 0.2 10*3/uL (ref 0.0–0.5)
Eosinophils Relative: 3 %
HCT: 38.9 % (ref 36.0–46.0)
Hemoglobin: 12.7 g/dL (ref 12.0–15.0)
Immature Granulocytes: 0 %
Lymphocytes Relative: 39 %
Lymphs Abs: 2.6 10*3/uL (ref 0.7–4.0)
MCH: 29.1 pg (ref 26.0–34.0)
MCHC: 32.6 g/dL (ref 30.0–36.0)
MCV: 89 fL (ref 80.0–100.0)
Monocytes Absolute: 0.6 10*3/uL (ref 0.1–1.0)
Monocytes Relative: 9 %
Neutro Abs: 3.3 10*3/uL (ref 1.7–7.7)
Neutrophils Relative %: 49 %
Platelets: 271 10*3/uL (ref 150–400)
RBC: 4.37 MIL/uL (ref 3.87–5.11)
RDW: 13.9 % (ref 11.5–15.5)
WBC: 6.7 10*3/uL (ref 4.0–10.5)
nRBC: 0 % (ref 0.0–0.2)

## 2023-03-12 LAB — GLUCOSE, CAPILLARY
Glucose-Capillary: 133 mg/dL — ABNORMAL HIGH (ref 70–99)
Glucose-Capillary: 113 mg/dL — ABNORMAL HIGH (ref 70–99)
Glucose-Capillary: 123 mg/dL — ABNORMAL HIGH (ref 70–99)
Glucose-Capillary: 132 mg/dL — ABNORMAL HIGH (ref 70–99)

## 2023-03-12 MED ORDER — ACETAMINOPHEN 325 MG PO TABS
650.0000 mg | ORAL_TABLET | ORAL | Status: DC | PRN
  Administered 2023-03-12 – 2023-03-13 (×3): 650 mg via ORAL
  Filled 2023-03-12 (×3): qty 2

## 2023-03-12 NOTE — Plan of Care (Signed)

## 2023-03-12 NOTE — Progress Notes (Signed)
[facility]                                                                               [name], is a 89 y.o. female, DOB - 1931/12/27, WJX:[id number] Admit date - 03/10/2023    Outpatient Primary MD for the patient is Luking, [name], MD  LOS - 2  days    Brief summary    89 y.o. female with medical history significant of hyperlipidemia, Hypertension, type 2 diabetes, diabetic peripheral neuropathy, diverticulosis, diverticulitis, mild cognitive dysfunction history of UTI since last month treated twice with oral antibiotics, seen antipain ED 3 days ago, who came into the emergency department with complaints of frequency, dysuria, lower left flank and suprapubic pain.. CTA abdomen/pelvis done 3 days ago with no acute finding to explain the patient's symptoms. There was extensive atherosclerosis of intra-abdominal arteries. Cholelithiasis without cholecystitis.   She was admitted for UTI.   Assessment & Plan    Assessment and Plan:  ACUTE Lower UTI:  Follow up urine cultures .  Continue with IV fluids and IV ceftriaxone for 24 hours and transition to oral antibiotics.  Pain better,.    Hyponatremia:  Possibly from dehydration .  Resolved. Sodium of 135.   Type 2 DM  CBG (last 3)  Recent Labs    03/12/23 0725 03/12/23 1153 03/12/23 1653  GLUCAP 133* 113* 123*   Continue with metformin.  Continue with SSI.    Hypertension:  Sub optimal.  Continue with norvasc and losartan 100 mg daily.   Peripheral neuropathy;  Resume gabapentin.      Estimated body mass index is 24.94 kg/m as calculated from the following:   Height as of this encounter: 5\' 8"  (1.727 m).   Weight as of this encounter: 74.4 kg.  Code Status: DNR- No CPR or chest compressions, May intubate, use advanced airway interventions and cardioversion/ACLS medications if appropriate or indicated. May transfer to ICU.  DVT Prophylaxis:  enoxaparin (LOVENOX) injection 40 mg Start:  03/10/23 2200   Level of Care: Level of care: Telemetry Family Communication: none at bedside.   Disposition Plan:     Remains inpatient appropriate:  IV antibiotics.   Procedures:  None.   Consultants:   None.   Antimicrobials:   Anti-infectives (From admission, onward)    Start     Dose/Rate Route Frequency Ordered Stop   03/11/23 1400  cefTRIAXone (ROCEPHIN) 1 g in sodium chloride 0.9 % 100 mL IVPB        1 g 200 mL/hr over 30 Minutes Intravenous Every 24 hours 03/10/23 1624     03/10/23 1245  cefTRIAXone (ROCEPHIN) 1 g in sodium chloride 0.9 % 100 mL IVPB        1 g 200 mL/hr over 30 Minutes Intravenous  Once 03/10/23 1241 03/10/23 1456        Medications  Scheduled Meds:  amLODipine  10 mg Oral Daily   cholecalciferol  1,000 Units Oral Daily   vitamin B-12  1,000 mcg Oral QHS   enoxaparin (LOVENOX) injection  40 mg Subcutaneous Q24H   ferrous sulfate  325 mg Oral QODAY   gabapentin  200 mg Oral  BID   loratadine  10 mg Oral Daily   losartan  100 mg Oral Daily   melatonin  3 mg Oral QHS   metFORMIN  250 mg Oral Q supper   metFORMIN  500 mg Oral Q breakfast   pantoprazole  40 mg Oral Daily   polyvinyl alcohol  1 drop Both Eyes BID   Continuous Infusions:  cefTRIAXone (ROCEPHIN)  IV 1 g (03/12/23 1320)   PRN Meds:.acetaminophen, ALPRAZolam, bisacodyl, ipratropium-albuterol, oxyCODONE    Subjective:   [name] was seen and examined today. Still feeling bette.   Objective:   Vitals:   03/11/23 1409 03/11/23 2031 03/12/23 0432 03/12/23 1505  BP: (!) 138/50 (!) 175/60 (!) 168/60 (!) 158/55  Pulse: 63 71 62 68  Resp: 19 18 16 18   Temp: 97.7 F (36.5 C) 98 F (36.7 C) 97.7 F (36.5 C) 97.8 F (36.6 C)  TempSrc: Oral Oral Oral Oral  SpO2: 96% 94% 96% 96%  Weight:      Height:        Intake/Output Summary (Last 24 hours) at 03/12/2023 1856 Last data filed at 03/12/2023 1836 Gross per 24 hour  Intake 920 ml  Output --  Net 920 ml   Filed  Weights   03/10/23 1059  Weight: 74.4 kg     Exam General exam: Appears calm and comfortable  Respiratory system: Clear to auscultation. Respiratory effort normal. Cardiovascular system: S1 & S2 heard, RRR. No JVD, Gastrointestinal system: Abdomen is nondistended, soft and nontender.  Central nervous system: Alert and oriented. No focal neurological deficits. Extremities: Symmetric 5 x 5 power. Skin: No rashes, lesions or ulcers Psychiatry:  Mood & affect appropriate.     Data Reviewed:  I have personally reviewed following labs and imaging studies   CBC Lab Results  Component Value Date   WBC 6.7 03/12/2023   RBC 4.37 03/12/2023   HGB 12.7 03/12/2023   HCT 38.9 03/12/2023   MCV 89.0 03/12/2023   MCH 29.1 03/12/2023   PLT 271 03/12/2023   MCHC 32.6 03/12/2023   RDW 13.9 03/12/2023   LYMPHSABS 2.6 03/12/2023   MONOABS 0.6 03/12/2023   EOSABS 0.2 03/12/2023   BASOSABS 0.0 03/12/2023     Last metabolic panel Lab Results  Component Value Date   NA 135 03/12/2023   K 4.5 03/12/2023   CL 96 (L) 03/12/2023   CO2 28 03/12/2023   BUN 20 03/12/2023   CREATININE 0.81 03/12/2023   GLUCOSE 122 (H) 03/12/2023   GFRNONAA >60 03/12/2023   GFRAA 69 06/06/2020   CALCIUM 9.8 03/12/2023   PHOS 3.5 12/01/2022   PROT 7.0 03/10/2023   ALBUMIN 4.0 03/10/2023   LABGLOB 2.1 11/30/2020   AGRATIO 2.2 11/30/2020   BILITOT 1.1 03/10/2023   ALKPHOS 80 03/10/2023   AST 19 03/10/2023   ALT 17 03/10/2023   ANIONGAP 11 03/12/2023    CBG (last 3)  Recent Labs    03/12/23 0725 03/12/23 1153 03/12/23 1653  GLUCAP 133* 113* 123*      Coagulation Profile: No results for input(s): "INR", "PROTIME" in the last 168 hours.   Radiology Studies: No results found.     Kathlen Mody M.D. [facility] 03/12/2023, 6:56 PM  Available via Epic secure chat 7am-7pm After 7 pm, please refer to night coverage provider listed on amion.

## 2023-03-12 NOTE — NC FL2 (Incomplete)
[place] MEDICAID FL2 LEVEL OF CARE FORM     IDENTIFICATION  Patient Name: [name] Birthdate: 1931/11/10 Sex: female Admission Date (Current Location): 03/10/2023  [facility] and [place] Number:  Producer, television/film/video and Address:  [facility],  [address] [place], [place] [address]      Provider Number: [id number]  Attending Physician Name and Address:  Kathlen Mody, MD  Relative Name and Phone Number:  Matelyn Mcfail       [phone]    Current Level of Care: Hospital Recommended Level of Care: Assisted Living Facility Prior Approval Number:    Date Approved/Denied:   PASRR Number:    Discharge Plan: Other (Comment) (Assisted Living facility)    Current Diagnoses: Patient Active Problem List   Diagnosis Date Noted   Type 2 diabetes mellitus (HCC) 03/10/2023   Acute lower UTI (urinary tract infection) 03/10/2023   Dehydration 12/01/2022   Generalized weakness 11/30/2022   Acute diarrhea 11/30/2022   Hypomagnesemia 11/30/2022   Hypertensive urgency 11/30/2022   Upper respiratory tract infection 11/30/2022   Diabetic neuropathy (HCC) 11/30/2022   S/P total knee arthroplasty, right 04/12/2022   Hypertension associated with type 2 diabetes mellitus (HCC) 02/25/2022   Aortic atherosclerosis (HCC) 02/25/2022   Chronic non-seasonal allergic rhinitis 02/25/2022   Diabetic peripheral neuropathy associated with type 2 diabetes mellitus (HCC) 02/25/2022   Chronic anxiety 02/25/2022   Pneumonia 02/17/2022   GERD (gastroesophageal reflux disease) 02/17/2022   Myalgia due to statin 02/15/2020   Cognitive dysfunction 03/04/2019   Generalized anxiety disorder 06/05/2018   Floaters in visual field, bilateral 03/23/2018   Living in assisted living 03/23/2018   Hyponatremia 03/08/2018   C. difficile diarrhea 03/03/2018   Osteoarthritis of right knee 12/02/2017   Closed fracture of left orbital floor with routine healing 01/17/2017   Hyperlipidemia associated  with type 2 diabetes mellitus (HCC) 10/02/2016   Pedal edema 10/02/2016   Insomnia 10/02/2016   SBO (small bowel obstruction) (HCC) 07/02/2016   Dyspnea on exertion 02/24/2016   Other fatigue    Leukocytosis 12/16/2014   Essential hypertension 12/15/2014   Type 2 diabetes mellitus with atherosclerosis of aorta (HCC) 02/15/2011    Orientation RESPIRATION BLADDER Height & Weight     Self, Time, Situation, Place  Normal Continent Weight: 74.4 kg Height:  5\' 8"  (172.7 cm)  BEHAVIORAL SYMPTOMS/MOOD NEUROLOGICAL BOWEL NUTRITION STATUS     (n/a) Continent Diet (Reduced concentrated sweets with no salt)  AMBULATORY STATUS COMMUNICATION OF NEEDS Skin   Independent (modified independent 300 ft rolling walker) Verbally Normal                       Personal Care Assistance Level of Assistance  Bathing, Feeding, Dressing Bathing Assistance: Independent (modified independent) Feeding assistance: Independent Dressing Assistance: Independent (modified independent)     Functional Limitations Info  Sight, Hearing, Speech Sight Info: Impaired (eyeglasses) Hearing Info: Adequate Speech Info: Adequate    SPECIAL CARE FACTORS FREQUENCY                       Contractures Contractures Info: Not present    Additional Factors Info  Code Status Code Status Info: DNF Pre arrest interventions desired             Current Medications (03/12/2023):  This is the current hospital active medication list Current Facility-Administered Medications  Medication Dose Route Frequency Provider Last Rate Last Admin   acetaminophen (TYLENOL) tablet 650  mg  650 mg Oral Q4H PRN Kathlen Mody, MD   650 mg at 03/12/23 0948   ALPRAZolam (XANAX) tablet 0.25 mg  0.25 mg Oral QHS PRN Bobette Mo, MD   0.25 mg at 03/11/23 2117   amLODipine (NORVASC) tablet 10 mg  10 mg Oral Daily Bobette Mo, MD   10 mg at 03/12/23 0815   bisacodyl (DULCOLAX) EC tablet 5 mg  5 mg Oral Daily PRN Bobette Mo, MD       cefTRIAXone (ROCEPHIN) 1 g in sodium chloride 0.9 % 100 mL IVPB  1 g Intravenous Q24H Bobette Mo, MD 200 mL/hr at 03/11/23 1436 1 g at 03/11/23 1436   cholecalciferol (VITAMIN D3) 25 MCG (1000 UNIT) tablet 1,000 Units  1,000 Units Oral Daily Bobette Mo, MD   1,000 Units at 03/12/23 0815   cyanocobalamin (VITAMIN B12) tablet 1,000 mcg  1,000 mcg Oral QHS Bobette Mo, MD   1,000 mcg at 03/11/23 2117   enoxaparin (LOVENOX) injection 40 mg  40 mg Subcutaneous Q24H Bobette Mo, MD   40 mg at 03/11/23 2117   ferrous sulfate tablet 325 mg  325 mg Oral Lamont Snowball, MD   325 mg at 03/11/23 0858   gabapentin (NEURONTIN) capsule 200 mg  200 mg Oral BID Bobette Mo, MD   200 mg at 03/12/23 0815   ipratropium-albuterol (DUONEB) 0.5-2.5 (3) MG/3ML nebulizer solution 3 mL  3 mL Nebulization Q6H PRN Bobette Mo, MD       lidocaine (LIDODERM) 5 % 1 patch  1 patch Transdermal Q24H Luiz Iron, NP   1 patch at 03/12/23 0122   loratadine (CLARITIN) tablet 10 mg  10 mg Oral Daily Bobette Mo, MD   10 mg at 03/12/23 0815   losartan (COZAAR) tablet 100 mg  100 mg Oral Daily Bobette Mo, MD   100 mg at 03/12/23 0815   melatonin tablet 3 mg  3 mg Oral QHS Bobette Mo, MD   3 mg at 03/11/23 2117   metFORMIN (GLUCOPHAGE) tablet 250 mg  250 mg Oral Q supper Bobette Mo, MD   250 mg at 03/11/23 1746   metFORMIN (GLUCOPHAGE) tablet 500 mg  500 mg Oral Q breakfast Bobette Mo, MD   500 mg at 03/12/23 7829   oxyCODONE (Oxy IR/ROXICODONE) immediate release tablet 2.5 mg  2.5 mg Oral Q4H PRN Bobette Mo, MD       pantoprazole (PROTONIX) EC tablet 40 mg  40 mg Oral Daily Bobette Mo, MD   40 mg at 03/12/23 0815   polyvinyl alcohol (LIQUIFILM TEARS) 1.4 % ophthalmic solution 1 drop  1 drop Both Eyes BID Bobette Mo, MD   1 drop at 03/12/23 0940     Discharge Medications: Please  see discharge summary for a list of discharge medications.  Relevant Imaging Results:  Relevant Lab Results:   Additional Information SS# [id number]  [name], RN

## 2023-03-12 NOTE — Progress Notes (Signed)
   03/12/23 1057  TOC Brief Assessment  Insurance and Status Reviewed  Patient has primary care physician Yes (Luking, [name], MD)  Home environment has been reviewed Yes (From assisted living [facility])  Prior level of function: Assisted Living Surveyor, quantity)  Social Determinants of Health Reivew SDOH reviewed no interventions necessary  Readmission risk has been reviewed Yes  Transition of care needs transition of care needs identified, TOC will continue to follow (From AL and will return. Updated FL2 and d/c summary needed at discharge.)

## 2023-03-12 NOTE — TOC Initial Note (Signed)
Transition of Care [facility]) - Initial/Assessment Note    Patient Details  Name: [name] MRN: [id number] Date of Birth: Jan 25, 1932  Transition of Care [facility]) CM/SW Contact:    [name], RN Phone Number:[phone]  03/12/2023, 11:20 AM  Clinical Narrative:                Brief assessment complete. TOC will follow patient admitted from [facility] ALF. CM spoke with [name] who confirms that patient is from [facility] and can return when medically stable. New FL2 must be submitted prior to return.   Fax # [phone].TOC will continue to follow for medical readiness.         Patient Goals and CMS Choice            Expected Discharge Plan and Services                                              Prior Living Arrangements/Services                       Activities of Daily Living Home Assistive Devices/Equipment: Dan Humphreys (specify type) ADL Screening (condition at time of admission) Patient's cognitive ability adequate to safely complete daily activities?: Yes Is the patient deaf or have difficulty hearing?: No Does the patient have difficulty seeing, even when wearing glasses/contacts?: No Does the patient have difficulty concentrating, remembering, or making decisions?: No Patient able to express need for assistance with ADLs?: Yes Does the patient have difficulty dressing or bathing?: Yes Independently performs ADLs?: Yes (appropriate for developmental age) Does the patient have difficulty walking or climbing stairs?: Yes Weakness of Legs: None Weakness of Arms/Hands: None  Permission Sought/Granted                  Emotional Assessment              Admission diagnosis:  Hyponatremia [E87.1] Weakness [R53.1] Acute cystitis without hematuria [N30.00] Acute lower UTI (urinary tract infection) [N39.0] Patient Active Problem List   Diagnosis Date Noted   Type 2 diabetes mellitus (HCC) 03/10/2023   Acute lower UTI (urinary tract  infection) 03/10/2023   Dehydration 12/01/2022   Generalized weakness 11/30/2022   Acute diarrhea 11/30/2022   Hypomagnesemia 11/30/2022   Hypertensive urgency 11/30/2022   Upper respiratory tract infection 11/30/2022   Diabetic neuropathy (HCC) 11/30/2022   S/P total knee arthroplasty, right 04/12/2022   Hypertension associated with type 2 diabetes mellitus (HCC) 02/25/2022   Aortic atherosclerosis (HCC) 02/25/2022   Chronic non-seasonal allergic rhinitis 02/25/2022   Diabetic peripheral neuropathy associated with type 2 diabetes mellitus (HCC) 02/25/2022   Chronic anxiety 02/25/2022   Pneumonia 02/17/2022   GERD (gastroesophageal reflux disease) 02/17/2022   Myalgia due to statin 02/15/2020   Cognitive dysfunction 03/04/2019   Generalized anxiety disorder 06/05/2018   Floaters in visual field, bilateral 03/23/2018   Living in assisted living 03/23/2018   Hyponatremia 03/08/2018   C. difficile diarrhea 03/03/2018   Osteoarthritis of right knee 12/02/2017   Closed fracture of left orbital floor with routine healing 01/17/2017   Hyperlipidemia associated with type 2 diabetes mellitus (HCC) 10/02/2016   Pedal edema 10/02/2016   Insomnia 10/02/2016   SBO (small bowel obstruction) (HCC) 07/02/2016   Dyspnea on exertion 02/24/2016   Other fatigue    Leukocytosis 12/16/2014   Essential hypertension 12/15/2014  Type 2 diabetes mellitus with atherosclerosis of aorta (HCC) 02/15/2011   PCP:  Babs Sciara, MD Pharmacy:   [facility], [place] - [address] [place] [address] Phone: [phone] Fax: [phone]     Social Determinants of Health (SDOH) Social History: SDOH Screenings   Food Insecurity: No Food Insecurity (03/10/2023)  Housing: Patient Declined (03/10/2023)  Transportation Needs: No Transportation Needs (03/10/2023)  Utilities: Not At Risk (03/10/2023)  Alcohol Screen: Low Risk  (09/06/2022)  Depression (PHQ2-9): Low Risk  (01/08/2023)   Financial Resource Strain: Low Risk  (09/06/2022)  Physical Activity: Sufficiently Active (09/06/2022)  Social Connections: Moderately Isolated (09/06/2022)  Stress: No Stress Concern Present (09/06/2022)  Tobacco Use: Low Risk  (03/10/2023)   SDOH Interventions:     Readmission Risk Interventions     No data to display

## 2023-03-12 NOTE — Progress Notes (Signed)
Mobility Specialist - Progress Note   03/12/23 1404  Mobility  Activity Ambulated with assistance in hallway  Level of Assistance Standby assist, set-up cues, supervision of patient - no hands on  Assistive Device Front wheel walker  Distance Ambulated (ft) 500 ft  Activity Response Tolerated well  Mobility Referral Yes  $Mobility charge 1 Mobility  Mobility Specialist Start Time (ACUTE ONLY) 0154  Mobility Specialist Stop Time (ACUTE ONLY) 0204  Mobility Specialist Time Calculation (min) (ACUTE ONLY) 10 min   Pt received in bathroom and agreeable to mobility. No complaints during session. Pt to bed after session with all needs met.    [facility]

## 2023-03-13 ENCOUNTER — Ambulatory Visit (INDEPENDENT_AMBULATORY_CARE_PROVIDER_SITE_OTHER): Payer: Medicare Other | Admitting: Gastroenterology

## 2023-03-13 DIAGNOSIS — N390 Urinary tract infection, site not specified: Secondary | ICD-10-CM | POA: Diagnosis not present

## 2023-03-13 LAB — TSH: TSH: 2.696 u[IU]/mL (ref 0.350–4.500)

## 2023-03-13 LAB — IRON AND TIBC
Iron: 49 ug/dL (ref 28–170)
Saturation Ratios: 15 % (ref 10.4–31.8)
TIBC: 329 ug/dL (ref 250–450)
UIBC: 280 ug/dL

## 2023-03-13 LAB — GLUCOSE, CAPILLARY
Glucose-Capillary: 133 mg/dL — ABNORMAL HIGH (ref 70–99)
Glucose-Capillary: 153 mg/dL — ABNORMAL HIGH (ref 70–99)

## 2023-03-13 LAB — VITAMIN B12: Vitamin B-12: 1370 pg/mL — ABNORMAL HIGH (ref 180–914)

## 2023-03-13 MED ORDER — FOSFOMYCIN TROMETHAMINE 3 G PO PACK
3.0000 g | PACK | Freq: Once | ORAL | Status: AC
  Administered 2023-03-13: 3 g via ORAL
  Filled 2023-03-13: qty 3

## 2023-03-13 NOTE — TOC Transition Note (Addendum)
Transition of Care [facility]) - CM/SW Discharge Note   Patient Details  Name: [name] MRN: [id number] Date of Birth: 1932-02-06  Transition of Care [facility]) CM/SW Contact:  [name], RN Phone Number:[phone]  03/13/2023, 1:51 PM   Clinical Narrative:    Patient with discharge orders. D/c summary and FL2 have been faxed to [facility]. CM called and spoke with [name] who will review info to confirm that patient is good to return.   1430 CM has confirmed that patient is good to return. Nurse has been given info to call report. Son to transport patient.         Patient Goals and CMS Choice      Discharge Placement                         Discharge Plan and Services Additional resources added to the After Visit Summary for                                       Social Determinants of Health (SDOH) Interventions SDOH Screenings   Food Insecurity: No Food Insecurity (03/10/2023)  Housing: Patient Declined (03/10/2023)  Transportation Needs: No Transportation Needs (03/10/2023)  Utilities: Not At Risk (03/10/2023)  Alcohol Screen: Low Risk  (09/06/2022)  Depression (PHQ2-9): Low Risk  (01/08/2023)  Financial Resource Strain: Low Risk  (09/06/2022)  Physical Activity: Sufficiently Active (09/06/2022)  Social Connections: Moderately Isolated (09/06/2022)  Stress: No Stress Concern Present (09/06/2022)  Tobacco Use: Low Risk  (03/10/2023)     Readmission Risk Interventions     No data to display

## 2023-03-13 NOTE — Discharge Summary (Signed)
Physician Discharge Summary   Patient: [name] MRN: [id number] DOB: 19-Sep-1931  Admit date:     03/10/2023  Discharge date: 03/13/23  Discharge Physician: Tyrone Nine   PCP: Babs Sciara, MD   Recommendations at discharge:  Follow up with PCP in 1-2 weeks, consider recheck BMP though sodium level normalized prior to discharge.  Follow up with urology for consideration of cystoscopy given recurrent UTI's.   Discharge Diagnoses: Principal Problem:   Acute lower UTI (urinary tract infection) Active Problems:   Essential hypertension   Hyponatremia   GERD (gastroesophageal reflux disease)   Aortic atherosclerosis (HCC)   Diabetic neuropathy (HCC)   Type 2 diabetes mellitus [facility])  Hospital Course: [name] is an 89 y.o. female with medical history significant of hyperlipidemia, Hypertension, type 2 diabetes, diabetic peripheral neuropathy, diverticulosis, diverticulitis, mild cognitive dysfunction history of UTI since last month treated twice with oral antibiotics, seen [facility] ED 3 days ago, who came into the emergency department with complaints of frequency, dysuria, lower left flank and suprapubic pain.. CTA abdomen/pelvis done 3 days ago with no acute finding to explain the patient's symptoms. There was extensive atherosclerosis of intra-abdominal arteries. Cholelithiasis without cholecystitis.    She was admitted for UTI and has improved significantly. PT evaluation confirms pt is at functional baseline, no ongoing PT needs. Please see problem-based details below.   Assessment and Plan: UTI: Recurrent, no significant growth on Cx. Has received 3 days ceftriaxone 1g with improvement in symptoms, no fever or leukocytosis. No evidence of sepsis/upper tract involvement.  - Will give an additional dose of fosfomycin on day of discharge and consider treatment completed. Has hx keflex-associated C. diff in the past, so aiming to limit abx as much as possible.   - Follow up  with urology, Dr. Retta Diones for cystoscopy   Hyponatremia: Due to dehydration. Resolved. Sodium of 135.    Type 2 DM: Continue home Tx    Hypertension: Continue home Tx   Peripheral neuropathy: Continue home Tx, gabapentin.   Consultants: None Procedures performed: None  Disposition: [facility] Diet recommendation:  Carb modified diet DISCHARGE MEDICATION: Allergies as of 03/13/2023       Reactions   Hydrocortisone Itching   Lisinopril    Other reaction(s): Lethargy (intolerance)   Phenergan [promethazine Hcl] Other (See Comments)   hallucinations   Statins Other (See Comments)   Severe myalgias   Trazodone And Nefazodone Cough   Prednisone Rash        Medication List     STOP taking these medications    erythromycin ophthalmic ointment       TAKE these medications    ALPRAZolam 0.25 MG tablet Commonly known as: XANAX TAKE (1) TABLET BY MOUTH AT BEDTIME AS NEEDED UNTIL GONE. FOR SHORT-TERM USE ONLY. What changed:  how much to take how to take this when to take this reasons to take this additional instructions   amLODipine 10 MG tablet Commonly known as: NORVASC Take 1 tablet (10 mg total) by mouth daily.   amoxicillin 500 MG capsule Commonly known as: AMOXIL Take 500 mg by mouth See admin instructions. Take (4) capsules by mouth 1-2 hours prior to dental appointment.   Anti-Diarrheal 2 MG tablet Generic drug: loperamide TAKE 1 TABLET BY MOUTH AS NEEDED FOR DIARRHEA OR LOOSE STOOL**MAX 4 TABLETS IN 24 HOURS** What changed: See the new instructions.   benzonatate 100 MG capsule Commonly known as: TESSALON TAKE (1) CAPSULE BY MOUTH 3 TIMES  DAILY AS NEEDED FOR COUGH. What changed: See the new instructions.   Beta Carotene Provitamin A 16109 units capsule Generic drug: beta carotene Take 25,000 Units by mouth daily.   bisacodyl 5 MG EC tablet Generic drug: bisacodyl TAKE 1 TABLET BY MOUTH ONCE DAILY AS NEEDED FOR MODERATE CONSTIPATION. What  changed: See the new instructions.   Combivent Respimat 20-100 MCG/ACT Aers respimat Generic drug: Ipratropium-Albuterol INHALE (1) PUFF INTO THE LUNGS EVERY SIX HOURS AS NEEDED FOR WHEEZING OR SHORTNESS OF BREATH. What changed: See the new instructions.   cyanocobalamin 1000 MCG tablet Take 1,000 mcg by mouth at bedtime.   dicyclomine 10 MG capsule Commonly known as: BENTYL TAKE 1 CAPSULE BY MOUTH TWICE DAILY AS NEEDED FOR ABDOMINAL CRAMPS AND LOOSE STOOL. What changed:  how much to take how to take this when to take this reasons to take this   EasyMax Test test strip Generic drug: glucose blood CHECK BLOOD SUGAR ONCE DAILY.(CALL MD IF BS BELOW 60: OR IF BS ABOVE 400)   Fish Oil 1000 MG Caps One capsule BID What changed:  how much to take how to take this when to take this additional instructions   gabapentin 100 MG capsule Commonly known as: NEURONTIN 2 po bid What changed:  how much to take how to take this when to take this additional instructions   GoodSense Hemorrhoidal 0.25-14-74.9 % rectal ointment Generic drug: phenylephrine-shark liver oil-mineral oil-petrolatum Place 1 Application rectally 3 (three) times daily as needed for irritation.   Iron (Ferrous Sulfate) 325 (65 Fe) MG Tabs Take 325 mg by mouth every other day.   loratadine 10 MG tablet Commonly known as: CLARITIN Take 10 mg by mouth daily.   losartan 100 MG tablet Commonly known as: COZAAR Take 1 tablet daily What changed:  how much to take how to take this when to take this additional instructions   meclizine 25 MG tablet Commonly known as: ANTIVERT Take 1 tablet (25 mg total) by mouth 3 (three) times daily as needed for dizziness or nausea.   melatonin 3 MG Tabs tablet Take 3 mg by mouth at bedtime.   metFORMIN 500 MG tablet Commonly known as: GLUCOPHAGE Take one tablet 500mg  po in the morning and 1/2 tablet 250 mg po at supper What changed:  how much to take how to take  this when to take this   Pain Relief Extra Strength 500 MG tablet Generic drug: acetaminophen TAKE (2) TABLETS BY MOUTH EVERY SIX HOURS A NEEDED FOR PAIN. What changed:  See the new instructions. Another medication with the same name was removed. Continue taking this medication, and follow the directions you see here.   pantoprazole 40 MG tablet Commonly known as: PROTONIX Take 1 tablet (40 mg total) by mouth daily.   PreserVision AREDS 2 Caps Take 1 capsule by mouth in the morning and at bedtime. What changed: Another medication with the same name was changed. Make sure you understand how and when to take each.   beta carotene w/minerals tablet Take 1 tablet by mouth at bedtime. What changed: when to take this   Theratears 0.25 % Soln Generic drug: Carboxymethylcellulose Sodium Apply 1 drop to eye in the morning and at bedtime.   Vitamin D (Cholecalciferol) 10 MCG (400 UNIT) Tabs TAKE 1 TABLET BY MOUTH ONCE DAILY.        Follow-up Information     Babs Sciara, MD Follow up.   Specialty: Family Medicine Contact information: [address] [place] [address] [phone]         Marcine Matar, MD Follow up.   Specialty: Urology Contact information: [address] [place] [address] [phone]                Discharge Exam: Ceasar Mons Weights   03/10/23 1059  Weight: 74.4 kg  BP (!) 164/61 (BP Location: Right Arm)   Pulse (!) 58   Temp 97.7 F (36.5 C) (Oral)   Resp 14   Ht 5\' 8"  (1.727 m)   Wt 74.4 kg   SpO2 96%   BMI 24.94 kg/m   Pleasant elderly female in no distress Clear, nonlabored RRR, no MRG Abd soft, NT, ND, +BS  Condition at discharge: stable  The results of significant diagnostics from this hospitalization (including imaging, microbiology, ancillary and laboratory) are listed below for reference.   Imaging Studies: CT Angio Abd/Pel W and/or Wo Contrast  Result Date: 03/07/2023 CLINICAL  DATA:  Lower GI bleed.  Abdominal pain.  Blood in stool. EXAM: CTA ABDOMEN AND PELVIS WITHOUT AND WITH CONTRAST TECHNIQUE: Multidetector CT imaging of the abdomen and pelvis was performed using the standard protocol during bolus administration of intravenous contrast. Multiplanar reconstructed images and MIPs were obtained and reviewed to evaluate the vascular anatomy. RADIATION DOSE REDUCTION: This exam was performed according to the departmental dose-optimization program which includes automated exposure control, adjustment of the mA and/or kV according to patient size and/or use of iterative reconstruction technique. CONTRAST:  OMNIPAQUE IOHEXOL 350 MG/ML SOLN COMPARISON:  CT of the abdomen and pelvis with contrast 02/25/2021 FINDINGS: VASCULAR Aorta: Extensive atherosclerotic calcifications are present throughout the abdominal aorta. No aneurysm or focal stenosis is present. Celiac: Atherosclerotic calcifications are present at the origin of the celiac artery with proximally 50% stenosis relative to the more distal vessel. The branch vessels are unremarkable. SMA: Atherosclerotic changes are present at the origin of the SMA. No significant stenosis is present relative to the more distal vessel. Mural plaque is noted approximately 3 cm from the origin of the SMA without a significant stenosis relative to the more distal vessel. The branch vessels are unremarkable. Renals: High-grade stenoses are present proximal renal arteries bilaterally, right greater than left. Branch vessels are within normal limits. An accessory left renal artery feeds the lower pole. IMA: A high-grade stenosis is present at the origin of the inferior mesenteric artery. The more distal branch vessels are within normal limits. Inflow: Atherosclerotic calcifications are present in the iliac arteries bilaterally without significant focal stenosis or aneurysm. Proximal Outflow: Atherosclerotic changes are present. No aneurysm or stenosis  is present. Veins: No obvious venous abnormality within the limitations of this arterial phase study. Review of the MIP images confirms the above findings. NON-VASCULAR Lower chest: The lung bases are clear without focal nodule, mass, or airspace disease. The heart size is normal. Hepatobiliary: A 6 mm cyst in the right lobe of the liver is stable. No other focal hepatic lesions are present. The common bile is within normal limits. Multiple layering stones are again noted in the gallbladder. No inflammatory changes are present. Pancreas: Unremarkable. No pancreatic ductal dilatation or surrounding inflammatory changes. Spleen: Normal in size without focal abnormality. Adrenals/Urinary Tract: A 10 mm simple cyst is noted anteriorly in the right kidney. A smaller simple cyst is present more posteriorly. No solid mass lesion or stone is present. No obstruction is present. The ureters are within normal limits. The urinary bladder is normal. Stomach/Bowel: The  stomach and duodenum are within normal limits. Multiple small bowel loops are adherent to the anterior peritoneum. No obstruction is present. No focal bowel inflammation is present. The terminal ileum is within normal limits. The appendix is not visualized and may be surgically absent. No focal inflammatory changes are present in the region. The ascending and transverse colon are within normal limits. The descending and sigmoid colon are normal. Lymphatic: No significant abdominal adenopathy is present. Reproductive: Status post hysterectomy. No adnexal masses. Other: Ventral hernia repair is intact. No significant ventral hernias are present. No free fluid or free air is present. Musculoskeletal: Vertebral body heights and alignment are normal. Straightening of the normal lumbar lordosis is present. IMPRESSION: 1. No acute or focal lesion to explain the patient's symptoms. 2. 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  An accessory left renal artery feeds the lower pole of the left kidney without focal stenosis. 3. 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 4. 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 5. Atherosclerotic changes at the origin of the SMA without significant stenosis relative to the more distal vessel. 6. Stable cholelithiasis without evidence of cholecystitis. 7. Simple cysts of the right kidney. No follow-up imaging is recommended. JACR 2018 Feb; 264-273, Management of the Incidental Renal Mass on CT, [employer] 2021; 814-848, Bosniak Classification of Cystic Renal Masses, Version 2019. 8. Ventral hernia repair is intact. 9.  Aortic Atherosclerosis (ICD10-I70.0). Electronically Signed   By: Marin Roberts M.D.   On: 03/07/2023 11:31   DG Chest 2 View  Result Date: 03/07/2023 CLINICAL DATA:  Weakness and chills. EXAM: CHEST - 2 VIEW COMPARISON:  One-view chest x-ray 01/25/2023. Two-view chest x-ray 11/30/2022. FINDINGS: The heart is enlarged. Minimal atelectasis or scarring is present the right base. Lungs are otherwise clear. No edema or effusion is present. No focal airspace disease is present. Atherosclerotic changes are again noted in the aorta. The visualized soft tissues and bony thorax are unremarkable. IMPRESSION: 1. Cardiomegaly without failure. 2. Minimal atelectasis or scarring at the right base. Electronically Signed   By: Marin Roberts M.D.   On: 03/07/2023 11:19    Microbiology: Results for orders placed or performed during the hospital encounter of 03/10/23  Urine Culture (for pregnant, neutropenic or urologic patients or patients with an indwelling urinary catheter)     Status: Abnormal   Collection Time: 03/10/23 11:00 AM   Specimen: Urine, Clean Catch  Result Value Ref Range Status   Specimen Description   Final    URINE, CLEAN CATCH Performed at [facility], [address], [place], [place] [address]    Special Requests   Final     NONE Performed at [facility], [address], [place], [place] [address]    Culture (A)  Final    <10,000 COLONIES/mL INSIGNIFICANT GROWTH Performed at [facility], [address] [place], [place] [address]    Report Status 03/11/2023 FINAL  Final    Labs: CBC: Recent Labs  Lab 03/07/23 0928 03/10/23 1135 03/12/23 0559  WBC 7.2 7.1 6.7  NEUTROABS  --   --  3.3  HGB 12.9 13.3 12.7  HCT 39.3 40.8 38.9  MCV 88.5 88.1 89.0  PLT 266 164 271   Basic Metabolic Panel: Recent Labs  Lab 03/07/23 0928 03/10/23 1135 03/12/23 0559  NA 133* 128* 135  K 4.3 4.0 4.5  CL 94* 90* 96*  CO2 30 26 28  GLUCOSE 139* 130* 122*  BUN 14 18 20   CREATININE 0.80 0.76 0.81  CALCIUM 9.5 9.5 9.8  MG 1.7  --   --    Liver Function Tests: Recent Labs  Lab 03/07/23 0928 03/10/23 1135  AST 18 19  ALT 17 17  ALKPHOS 80 80  BILITOT 1.0 1.1  PROT 6.9 7.0  ALBUMIN 3.9 4.0   CBG: Recent Labs  Lab 03/12/23 0725 03/12/23 1153 03/12/23 1653 03/12/23 2111 03/13/23 0743  GLUCAP 133* 113* 123* 132* 133*    Discharge time spent: greater than 30 minutes.  Signed: Tyrone Nine, MD [facility] 03/13/2023

## 2023-03-13 NOTE — Plan of Care (Signed)
Alert and oriented x 4. Free of falls and other injuries. Vital signs are stable. Returning to [facility]. Report given to [place].

## 2023-03-16 ENCOUNTER — Emergency Department (HOSPITAL_COMMUNITY)
Admission: EM | Admit: 2023-03-16 | Discharge: 2023-03-16 | Disposition: A | Discharge status: Home / Self Care | Payer: Medicare Other | Attending: Emergency Medicine | Admitting: Emergency Medicine

## 2023-03-16 ENCOUNTER — Encounter (HOSPITAL_COMMUNITY): Payer: Self-pay

## 2023-03-16 ENCOUNTER — Emergency Department (HOSPITAL_COMMUNITY): Payer: Medicare Other

## 2023-03-16 DIAGNOSIS — R339 Retention of urine, unspecified: Secondary | ICD-10-CM | POA: Insufficient documentation

## 2023-03-16 DIAGNOSIS — Z1152 Encounter for screening for COVID-19: Secondary | ICD-10-CM | POA: Diagnosis not present

## 2023-03-16 DIAGNOSIS — Z7984 Long term (current) use of oral hypoglycemic drugs: Secondary | ICD-10-CM | POA: Insufficient documentation

## 2023-03-16 DIAGNOSIS — N390 Urinary tract infection, site not specified: Secondary | ICD-10-CM | POA: Diagnosis not present

## 2023-03-16 DIAGNOSIS — Z79899 Other long term (current) drug therapy: Secondary | ICD-10-CM | POA: Diagnosis not present

## 2023-03-16 DIAGNOSIS — I10 Essential (primary) hypertension: Secondary | ICD-10-CM | POA: Diagnosis not present

## 2023-03-16 DIAGNOSIS — Z8616 Personal history of COVID-19: Secondary | ICD-10-CM | POA: Diagnosis not present

## 2023-03-16 DIAGNOSIS — R531 Weakness: Secondary | ICD-10-CM | POA: Insufficient documentation

## 2023-03-16 DIAGNOSIS — E119 Type 2 diabetes mellitus without complications: Secondary | ICD-10-CM | POA: Diagnosis not present

## 2023-03-16 DIAGNOSIS — R350 Frequency of micturition: Secondary | ICD-10-CM | POA: Diagnosis not present

## 2023-03-16 DIAGNOSIS — R6883 Chills (without fever): Secondary | ICD-10-CM | POA: Diagnosis not present

## 2023-03-16 HISTORY — DX: Encounter for other specified aftercare: Z51.89

## 2023-03-16 LAB — COMPREHENSIVE METABOLIC PANEL
ALT: 19 U/L (ref 0–44)
AST: 18 U/L (ref 15–41)
Albumin: 4.1 g/dL (ref 3.5–5.0)
Alkaline Phosphatase: 75 U/L (ref 38–126)
Anion gap: 8 (ref 5–15)
BUN: 15 mg/dL (ref 8–23)
CO2: 29 mmol/L (ref 22–32)
Calcium: 9.5 mg/dL (ref 8.9–10.3)
Chloride: 96 mmol/L — ABNORMAL LOW (ref 98–111)
Creatinine, Ser: 0.75 mg/dL (ref 0.44–1.00)
GFR, Estimated: >60 mL/min (ref >60)
Glucose, Bld: 136 mg/dL — ABNORMAL HIGH (ref 70–99)
Potassium: 4 mmol/L (ref 3.5–5.1)
Sodium: 133 mmol/L — ABNORMAL LOW (ref 135–145)
Total Bilirubin: 0.9 mg/dL (ref 0.3–1.2)
Total Protein: 7 g/dL (ref 6.5–8.1)

## 2023-03-16 LAB — URINALYSIS, ROUTINE W REFLEX MICROSCOPIC
Bilirubin Urine: NEGATIVE
Glucose, UA: NEGATIVE mg/dL
Hgb urine dipstick: NEGATIVE
Ketones, ur: NEGATIVE mg/dL
Nitrite: NEGATIVE
Protein, ur: 30 mg/dL — AB
Specific Gravity, Urine: 1.01 (ref 1.005–1.030)
pH: 7 (ref 5.0–8.0)

## 2023-03-16 LAB — RESP PANEL BY RT-PCR (RSV, FLU A&B, COVID)  RVPGX2
Influenza A by PCR: NEGATIVE
Influenza B by PCR: NEGATIVE
Resp Syncytial Virus by PCR: NEGATIVE
SARS Coronavirus 2 by RT PCR: NEGATIVE

## 2023-03-16 LAB — CBC
HCT: 41.4 % (ref 36.0–46.0)
Hemoglobin: 13.4 g/dL (ref 12.0–15.0)
MCH: 29.3 pg (ref 26.0–34.0)
MCHC: 32.4 g/dL (ref 30.0–36.0)
MCV: 90.6 fL (ref 80.0–100.0)
Platelets: 275 10*3/uL (ref 150–400)
RBC: 4.57 MIL/uL (ref 3.87–5.11)
RDW: 13.9 % (ref 11.5–15.5)
WBC: 7.3 10*3/uL (ref 4.0–10.5)
nRBC: 0 % (ref 0.0–0.2)

## 2023-03-16 LAB — TROPONIN I (HIGH SENSITIVITY)
Troponin I (High Sensitivity): 8 ng/L (ref <18)
Troponin I (High Sensitivity): 7 ng/L (ref <18)

## 2023-03-16 LAB — TSH: TSH: 1.705 u[IU]/mL (ref 0.350–4.500)

## 2023-03-16 LAB — BRAIN NATRIURETIC PEPTIDE: B Natriuretic Peptide: 96 pg/mL (ref 0.0–100.0)

## 2023-03-16 MED ORDER — LACTATED RINGERS IV BOLUS
500.0000 mL | Freq: Once | INTRAVENOUS | Status: AC
  Administered 2023-03-16: 500 mL via INTRAVENOUS

## 2023-03-16 NOTE — ED Provider Notes (Signed)
[name] Provider Note   CSN: [id number] Arrival date & time: 03/16/23  9518     History  Chief Complaint  Patient presents with   Urinary Retention    [name] is a 89 y.o. female.  89 year old female with a history of hypertension, hyperlipidemia, diabetes, and mild cognitive dysfunction who presents to the emergency department with generalized weakness.  Patient has had generalized weakness for the past 2 to 3 months.  Was recently hospitalized and discharged on 03/13/2023 for suspected UTI but her urine culture came back negative.  Was also hyponatremic at that point in time that resolved.  Says that since going back to her facility over the past few days has been feeling more weak than usual.  Was walking a mile for half mile a day previously and now is only able to go from room to room.  Also has been having some chills recently.  No runny nose, sore throat or cough.  No dysuria or frequency now.  No nausea, vomiting, or diarrhea.  Also reports being under significant stress because her daughter-in-law was recently diagnosed with metastatic cancer.       Home Medications Prior to Admission medications   Medication Sig Start Date End Date Taking? Authorizing Provider  acetaminophen (PAIN RELIEF EXTRA STRENGTH) 500 MG tablet TAKE (2) TABLETS BY MOUTH EVERY SIX HOURS A NEEDED FOR PAIN. Patient taking differently: Take 1,000 mg by mouth every 6 (six) hours as needed for moderate pain. 01/15/23   Babs Sciara, MD  ALPRAZolam (XANAX) 0.25 MG tablet TAKE (1) TABLET BY MOUTH AT BEDTIME AS NEEDED UNTIL GONE. FOR SHORT-TERM USE ONLY. Patient taking differently: Take 0.25 mg by mouth at bedtime as needed for sleep. 02/25/23   Babs Sciara, MD  amLODipine (NORVASC) 10 MG tablet Take 1 tablet (10 mg total) by mouth daily. 12/02/22   Shon Hale, MD  amoxicillin (AMOXIL) 500 MG capsule Take 500 mg by mouth See admin instructions.  Take (4) capsules by mouth 1-2 hours prior to dental appointment.    [provider]  benzonatate (TESSALON) 100 MG capsule TAKE (1) CAPSULE BY MOUTH 3 TIMES DAILY AS NEEDED FOR COUGH. Patient taking differently: Take 100 mg by mouth 3 (three) times daily as needed for cough. 11/21/22   Babs Sciara, MD  BETA CAROTENE PROVITAMIN A 84166 units capsule Take 25,000 Units by mouth daily. 01/01/23   [provider]  beta carotene w/minerals (OCUVITE) tablet Take 1 tablet by mouth at bedtime. Patient taking differently: Take 1 tablet by mouth daily. 07/03/22   Babs Sciara, MD  BISACODYL 5 MG EC tablet TAKE 1 TABLET BY MOUTH ONCE DAILY AS NEEDED FOR MODERATE CONSTIPATION. Patient taking differently: Take 5 mg by mouth daily as needed for moderate constipation. 12/20/22   Babs Sciara, MD  Carboxymethylcellulose Sodium (THERATEARS) 0.25 % SOLN Apply 1 drop to eye in the morning and at bedtime.    [provider]  COMBIVENT RESPIMAT 20-100 MCG/ACT AERS respimat INHALE (1) PUFF INTO THE LUNGS EVERY SIX HOURS AS NEEDED FOR WHEEZING OR SHORTNESS OF BREATH. Patient taking differently: Inhale 1 puff into the lungs every 6 (six) hours as needed for wheezing or shortness of breath. 11/21/22   Babs Sciara, MD  cyanocobalamin 1000 MCG tablet Take 1,000 mcg by mouth at bedtime.    [provider]  dicyclomine (BENTYL) 10 MG capsule TAKE 1 CAPSULE BY MOUTH TWICE DAILY AS  NEEDED FOR ABDOMINAL CRAMPS AND LOOSE STOOL. Patient taking differently: Take 10 mg by mouth 2 (two) times daily as needed (abdominal cramps and loose stools.). TAKE 1 CAPSULE BY MOUTH TWICE DAILY AS NEEDED FOR ABDOMINAL CRAMPS AND LOOSE STOOL. 11/21/22   Babs Sciara, MD  EASYMAX TEST test strip CHECK BLOOD SUGAR ONCE DAILY.(CALL MD IF BS BELOW 60: OR IF BS ABOVE 400) 02/03/23   Luking, [name], MD  gabapentin (NEURONTIN) 100 MG capsule 2 po bid Patient taking differently: Take 200 mg by mouth 2 (two) times  daily. 11/19/22   Babs Sciara, MD  GOODSENSE HEMORRHOIDAL 0.25-14-74.9 % rectal ointment Place 1 Application rectally 3 (three) times daily as needed for irritation. 12/26/21   [provider]  Iron, Ferrous Sulfate, 325 (65 Fe) MG TABS Take 325 mg by mouth every other day. 02/20/23   Babs Sciara, MD  loperamide (ANTI-DIARRHEAL) 2 MG tablet TAKE 1 TABLET BY MOUTH AS NEEDED FOR DIARRHEA OR LOOSE STOOL**MAX 4 TABLETS IN 24 HOURS** Patient taking differently: Take 2 mg by mouth 4 (four) times daily as needed for diarrhea or loose stools. 12/27/22   Babs Sciara, MD  loratadine (CLARITIN) 10 MG tablet Take 10 mg by mouth daily.    [provider]  losartan (COZAAR) 100 MG tablet Take 1 tablet daily Patient taking differently: Take 100 mg by mouth daily. 12/02/22   Shon Hale, MD  meclizine (ANTIVERT) 25 MG tablet Take 1 tablet (25 mg total) by mouth 3 (three) times daily as needed for dizziness or nausea. 01/24/23   Babs Sciara, MD  melatonin 3 MG TABS tablet Take 3 mg by mouth at bedtime.    [provider]  metFORMIN (GLUCOPHAGE) 500 MG tablet Take one tablet 500mg  po in the morning and 1/2 tablet 250 mg po at supper Patient taking differently: Take 750 mg by mouth daily. Take one tablet 500mg  po in the morning and 1/2 tablet 250 mg po at supper 12/02/22   Shon Hale, MD  Multiple Vitamins-Minerals (PRESERVISION AREDS 2) CAPS Take 1 capsule by mouth in the morning and at bedtime.    [provider]  Omega-3 Fatty Acids (FISH OIL) 1000 MG CAPS One capsule BID Patient taking differently: Take 1 capsule by mouth in the morning and at bedtime. 08/07/21   Babs Sciara, MD  pantoprazole (PROTONIX) 40 MG tablet Take 1 tablet (40 mg total) by mouth daily. 02/11/23   Babs Sciara, MD  Vitamin D, Cholecalciferol, 10 MCG (400 UNIT) TABS TAKE 1 TABLET BY MOUTH ONCE DAILY. 05/12/20   Babs Sciara, MD      Allergies    Hydrocortisone, Lisinopril,  Phenergan [promethazine hcl], Statins, Trazodone and nefazodone, and Prednisone    Review of Systems   Review of Systems  Physical Exam Updated Vital Signs BP (!) 147/49 (BP Location: Left Arm)   Pulse 64   Temp 98 F (36.7 C) (Oral)   Resp 18   Ht 5\' 8"  (1.727 m)   Wt 74.4 kg   SpO2 93%   BMI 24.94 kg/m  Physical Exam Vitals and nursing note reviewed.  Constitutional:      General: She is not in acute distress.    Appearance: She is well-developed.  HENT:     Head: Normocephalic and atraumatic.     Right Ear: External ear normal.     Left Ear: External ear normal.     Nose: Nose normal.  Eyes:  Extraocular Movements: Extraocular movements intact.     Conjunctiva/sclera: Conjunctivae normal.     Pupils: Pupils are equal, round, and reactive to light.  Cardiovascular:     Rate and Rhythm: Normal rate and regular rhythm.     Heart sounds: No murmur heard. Pulmonary:     Effort: Pulmonary effort is normal. No respiratory distress.     Breath sounds: Normal breath sounds.  Abdominal:     General: Abdomen is flat. There is no distension.     Palpations: Abdomen is soft. There is no mass.     Tenderness: There is no abdominal tenderness. There is no guarding.  Musculoskeletal:     Cervical back: Normal range of motion and neck supple.     Right lower leg: No edema.     Left lower leg: No edema.  Skin:    General: Skin is warm and dry.  Neurological:     Mental Status: She is alert and oriented to person, place, and time. Mental status is at baseline.     Cranial Nerves: No cranial nerve deficit.     Sensory: No sensory deficit.     Motor: No weakness.  Psychiatric:        Mood and Affect: Mood normal.     ED Results / Procedures / Treatments   Labs (all labs ordered are listed, but only abnormal results are displayed) Labs Reviewed  URINALYSIS, ROUTINE W REFLEX MICROSCOPIC - Abnormal; Notable for the following components:      Result Value   Color, Urine  STRAW (*)    Protein, ur 30 (*)    Leukocytes,Ua SMALL (*)    Bacteria, UA RARE (*)    All other components within normal limits  COMPREHENSIVE METABOLIC PANEL - Abnormal; Notable for the following components:   Sodium 133 (*)    Chloride 96 (*)    Glucose, Bld 136 (*)    All other components within normal limits  RESP PANEL BY RT-PCR (RSV, FLU A&B, COVID)  RVPGX2  CBC  BRAIN NATRIURETIC PEPTIDE  TSH  TROPONIN I (HIGH SENSITIVITY)  TROPONIN I (HIGH SENSITIVITY)    EKG EKG Interpretation Date/Time:  Sunday March 16 2023 11:52:04 EDT Ventricular Rate:  72 PR Interval:  202 QRS Duration:  84 QT Interval:  397 QTC Calculation: 435 R Axis:   -7  Text Interpretation: Sinus rhythm Ventricular premature complex Inferior infarct, old Anterior infarct, old Confirmed by Vonita Moss [phone]) on 03/16/2023 2:45:33 PM  Radiology DG Chest Portable 1 View  Result Date: 03/16/2023 CLINICAL DATA:  Pt reported urinary frequency and not fully emptying bladder, with abdominal pressure. Recently seen for UTI and given antibiotics. EXAM: PORTABLE CHEST 1 VIEW COMPARISON:  03/07/2023 and older studies.  CT, 02/25/2021. FINDINGS: Cardiac silhouette mildly enlarged.  No mediastinal or hilar masses. Lungs are hyperexpanded. Mild linear atelectasis or scarring noted in the right lung base. No lung consolidation or edema. No convincing pleural effusion and no pneumothorax. Skeletal structures are demineralized, grossly intact. IMPRESSION: No acute cardiopulmonary disease. Electronically Signed   By: Amie Portland M.D.   On: 03/16/2023 11:56    Procedures Procedures    Medications Ordered in ED Medications  lactated ringers bolus 500 mL (0 mLs Intravenous Stopped 03/16/23 1245)    ED Course/ Medical Decision Making/ A&P  Medical Decision Making Amount and/or Complexity of Data Reviewed Labs: ordered. Radiology: ordered.   AROHA BUFANO is a 89 y.o.  female with comorbidities that complicate the patient evaluation including hypertension, hyperlipidemia, diabetes, and mild cognitive dysfunction who presents emergency department with generalized weakness  Initial Ddx:  Anemia, hypothyroidism, infection, electrolyte abnormalities, stress/depression, MI  MDM/Course:  Patient presents to the emergency department with ongoing weakness that has worsened since she was hospitalized and discharged for UTI.  Not having any urinary symptoms or infectious symptoms otherwise at this time but did check for them in case they were causing her generalized weakness.  Chest x-ray and urinalysis without signs of infection.  COVID and flu negative.  No anemia or signs of electrolyte abnormalities that would be causing her symptoms.  Did also obtain EKG and troponins that did not reveal signs of MI.  Upon re-evaluation patient remained stable.  Unclear exactly what is causing her symptoms at this time if this could just be generalized deconditioning from her recent hospitalization or potentially that could be due to stress.  Will have her follow-up with her primary doctor in several days and discuss whether or not she would benefit from mental health counseling as well for her anxiety.  This patient presents to the ED for concern of complaints listed in HPI, this involves an extensive number of treatment options, and is a complaint that carries with it a high risk of complications and morbidity. Disposition including potential need for admission considered.   Dispo: DC Home. Return precautions discussed including, but not limited to, those listed in the AVS. Allowed pt time to ask questions which were answered fully prior to dc.  Additional history obtained from son Records reviewed Outpatient Clinic Notes and Admission Notes The following labs were independently interpreted: Chemistry and show no acute abnormality I independently reviewed the following imaging with  scope of interpretation limited to determining acute life threatening conditions related to emergency care: Chest x-ray and agree with the radiologist interpretation with the following exceptions: none I personally reviewed and interpreted cardiac monitoring: normal sinus rhythm  I personally reviewed and interpreted the pt's EKG: see above for interpretation  I have reviewed the patients home medications and made adjustments as needed Social Determinants of health:  Elderly         Final Clinical Impression(s) / ED Diagnoses Final diagnoses:  Generalized weakness    Rx / DC Orders ED Discharge Orders     None         Rondel Baton, MD 03/16/23 1836

## 2023-03-16 NOTE — Discharge Instructions (Signed)
You were seen for your weakness in the emergency department.   At home, please follow-up with your primary doctor in 2 to 3 days.  Talk to them to see if you need to follow-up with a counselor or psychiatrist..    Return immediately to the emergency department if you experience any of the following: Fevers, or any other concerning symptoms.    Thank you for visiting our Emergency Department. It was a pleasure taking care of you today.

## 2023-03-16 NOTE — ED Triage Notes (Signed)
Pt arrived pov reporting urinary frequency, states not fully emptying bladder. Report abdominal pressure. Recently seen for UTI and given antibiotics. No other urinary symptoms

## 2023-03-17 ENCOUNTER — Telehealth: Payer: Self-pay

## 2023-03-17 NOTE — Transitions of Care (Post Inpatient/ED Visit) (Signed)
03/17/2023  Name: [name] MRN: [id number] DOB: [birth year]  Today's TOC FU Call Status: Today's TOC FU Call Status:: Successful TOC FU Call Completed TOC FU Call Complete Date: 03/17/23 Patient's Name and Date of Birth confirmed.  Transition Care Management Follow-up Telephone Call Date of Discharge: 03/13/23 Discharge Facility: [facility]) Type of Discharge: Inpatient Admission Primary Inpatient Discharge Diagnosis:: weakness How have you been since you were released from the hospital?: Better Any questions or concerns?: No  Items Reviewed: Did you receive and understand the discharge instructions provided?: Yes Medications obtained,verified, and reconciled?: Yes (Medications Reviewed) Any new allergies since your discharge?: No Dietary orders reviewed?: Yes Do you have support at home?: Yes People in Home: facility resident  Medications Reviewed Today: Medications Reviewed Today     Reviewed by Karena Addison, LPN (Licensed Practical Nurse) on 03/17/23 at 1456  Med List Status: <None>   Medication Order Taking? Sig Documenting Provider Last Dose Status Informant  acetaminophen (PAIN RELIEF EXTRA STRENGTH) 500 MG tablet [id number] Yes TAKE (2) TABLETS BY MOUTH EVERY SIX HOURS A NEEDED FOR PAIN.  Patient taking differently: Take 1,000 mg by mouth every 6 (six) hours as needed for moderate pain.   Babs Sciara, MD Taking Active Nursing Home Medication Administration Guide (MAG), Pharmacy Records  ALPRAZolam Prudy Feeler) 0.25 MG tablet [id number] Yes TAKE (1) TABLET BY MOUTH AT BEDTIME AS NEEDED UNTIL GONE. FOR SHORT-TERM USE ONLY.  Patient taking differently: Take 0.25 mg by mouth at bedtime as needed for sleep.   Babs Sciara, MD Taking Active Nursing Home Medication Administration Guide (MAG), Pharmacy Records  amLODipine (NORVASC) 10 MG tablet [id number] Yes Take 1 tablet (10 mg total) by mouth daily. Shon Hale, MD Taking Active Nursing Home Medication  Administration Guide (MAG), Pharmacy Records  amoxicillin (AMOXIL) 500 MG capsule [id number] Yes Take 500 mg by mouth See admin instructions. Take (4) capsules by mouth 1-2 hours prior to dental appointment. [provider] Taking Active Nursing Home Medication Administration Guide (MAG), Pharmacy Records  benzonatate (TESSALON) 100 MG capsule [id number] No TAKE (1) CAPSULE BY MOUTH 3 TIMES DAILY AS NEEDED FOR COUGH.  Patient not taking: Reported on 03/17/2023   Babs Sciara, MD Not Taking Active Nursing Home Medication Administration Guide (MAG), Pharmacy Records  BETA CAROTENE PROVITAMIN A 09323 units capsule [id number] Yes Take 25,000 Units by mouth daily. [provider] Taking Active Nursing Home Medication Administration Guide (MAG), Pharmacy Records  beta carotene w/minerals (OCUVITE) tablet [id number] Yes Take 1 tablet by mouth at bedtime.  Patient taking differently: Take 1 tablet by mouth daily.   Babs Sciara, MD Taking Active Nursing Home Medication Administration Guide (MAG), Pharmacy Records  BISACODYL 5 MG EC tablet [id number] Yes TAKE 1 TABLET BY MOUTH ONCE DAILY AS NEEDED FOR MODERATE CONSTIPATION.  Patient taking differently: Take 5 mg by mouth daily as needed for moderate constipation.   Babs Sciara, MD Taking Active Nursing Home Medication Administration Guide (MAG), Pharmacy Records  Carboxymethylcellulose Sodium (THERATEARS) 0.25 % SOLN [id number] Yes Apply 1 drop to eye in the morning and at bedtime. [provider] Taking Active Nursing Home Medication Administration Guide (MAG), Pharmacy Records  COMBIVENT RESPIMAT 20-100 MCG/ACT AERS respimat [id number] Yes INHALE (1) PUFF INTO THE LUNGS EVERY SIX HOURS AS NEEDED FOR WHEEZING OR SHORTNESS OF BREATH.  Patient taking differently: Inhale 1 puff into the lungs every 6 (six) hours as needed for wheezing or shortness of breath.   Babs Sciara, MD  Taking Active Nursing Home Medication Administration  Guide (MAG), Pharmacy Records  cyanocobalamin 1000 MCG tablet [id number] Yes Take 1,000 mcg by mouth at bedtime. [provider] Taking Active Nursing Home Medication Administration Guide (MAG), Pharmacy Records  dicyclomine (BENTYL) 10 MG capsule [id number] Yes TAKE 1 CAPSULE BY MOUTH TWICE DAILY AS NEEDED FOR ABDOMINAL CRAMPS AND LOOSE STOOL.  Patient taking differently: Take 10 mg by mouth 2 (two) times daily as needed (abdominal cramps and loose stools.). TAKE 1 CAPSULE BY MOUTH TWICE DAILY AS NEEDED FOR ABDOMINAL CRAMPS AND LOOSE STOOL.   Babs Sciara, MD Taking Active Nursing Home Medication Administration Guide (MAG), Pharmacy Records  [facility] TEST test strip [id number] Yes CHECK BLOOD SUGAR ONCE DAILY.(CALL MD IF BS BELOW 60: OR IF BS ABOVE 400) Luking, [name], MD Taking Active Nursing Home Medication Administration Guide (MAG), Pharmacy Records  gabapentin (NEURONTIN) 100 MG capsule [id number] Yes 2 po bid  Patient taking differently: Take 200 mg by mouth 2 (two) times daily.   Babs Sciara, MD Taking Active Nursing Home Medication Administration Guide (MAG), Pharmacy Records  GOODSENSE HEMORRHOIDAL 0.25-14-74.9 % rectal ointment [id number] Yes Place 1 Application rectally 3 (three) times daily as needed for irritation. [provider] Taking Active Nursing Home Medication Administration Guide (MAG), Pharmacy Records  Iron, Ferrous Sulfate, 325 (65 Fe) MG TABS [id number] Yes Take 325 mg by mouth every other day. Babs Sciara, MD Taking Active Nursing Home Medication Administration Guide (MAG), Pharmacy Records  loperamide (ANTI-DIARRHEAL) 2 MG tablet [id number] Yes TAKE 1 TABLET BY MOUTH AS NEEDED FOR DIARRHEA OR LOOSE STOOL**MAX 4 TABLETS IN 24 HOURS**  Patient taking differently: Take 2 mg by mouth 4 (four) times daily as needed for diarrhea or loose stools.   Babs Sciara, MD Taking Active Nursing Home Medication Administration Guide (MAG), Pharmacy Records   loratadine (CLARITIN) 10 MG tablet [id number] Yes Take 10 mg by mouth daily. [provider] Taking Active Nursing Home Medication Administration Guide (MAG), Pharmacy Records  losartan (COZAAR) 100 MG tablet [id number] Yes Take 1 tablet daily  Patient taking differently: Take 100 mg by mouth daily.   Shon Hale, MD Taking Active Nursing Home Medication Administration Guide (MAG), Pharmacy Records  meclizine (ANTIVERT) 25 MG tablet [id number] Yes Take 1 tablet (25 mg total) by mouth 3 (three) times daily as needed for dizziness or nausea. Babs Sciara, MD Taking Active Nursing Home Medication Administration Guide (MAG), Pharmacy Records  melatonin 3 MG TABS tablet [id number] Yes Take 3 mg by mouth at bedtime. [provider] Taking Active Nursing Home Medication Administration Guide (MAG), Pharmacy Records  metFORMIN (GLUCOPHAGE) 500 MG tablet [id number] Yes Take one tablet 500mg  po in the morning and 1/2 tablet 250 mg po at supper  Patient taking differently: Take 750 mg by mouth daily. Take one tablet 500mg  po in the morning and 1/2 tablet 250 mg po at supper   Shon Hale, MD Taking Active Nursing Home Medication Administration Guide (MAG), Pharmacy Records  Multiple Vitamins-Minerals (PRESERVISION AREDS 2) CAPS [id number] Yes Take 1 capsule by mouth in the morning and at bedtime. [provider] Taking Active Nursing Home Medication Administration Guide (MAG), Pharmacy Records  Omega-3 Fatty Acids (FISH OIL) 1000 MG CAPS [id number] Yes One capsule BID  Patient taking differently: Take 1 capsule by mouth in the morning and at bedtime.   Babs Sciara, MD Taking Active Nursing Home Medication Administration Guide (MAG), Pharmacy Records  pantoprazole (PROTONIX) 40 MG tablet [id number] Yes  Take 1 tablet (40 mg total) by mouth daily. Babs Sciara, MD Taking Active Nursing Home Medication Administration Guide (MAG), Pharmacy Records  Vitamin D, Cholecalciferol, 10  MCG (400 UNIT) TABS [id number] Yes TAKE 1 TABLET BY MOUTH ONCE DAILY. Babs Sciara, MD Taking Active Nursing Home Medication Administration Guide [facility]), Pharmacy Records  Med List Note Lenoria Farrier, Carolynn Sayers 03/26/22 4098): [facility] [phone]            Home Care and Equipment/Supplies: Were Home Health Services Ordered?: NA Any new equipment or medical supplies ordered?: NA  Functional Questionnaire: Do you need assistance with bathing/showering or dressing?: Yes Do you need assistance with meal preparation?: Yes Do you need assistance with eating?: No Do you have difficulty maintaining continence: No Do you need assistance with getting out of bed/getting out of a chair/moving?: Yes Do you have difficulty managing or taking your medications?: Yes  Follow up appointments reviewed: PCP Follow-up appointment confirmed?: Yes Date of PCP follow-up appointment?: 03/25/23 Follow-up Provider: Vicente Males, LPN [facility] Nurse Health Advisor Direct Dial [phone]

## 2023-03-19 DIAGNOSIS — H18521 Epithelial (juvenile) corneal dystrophy, right eye: Secondary | ICD-10-CM | POA: Diagnosis not present

## 2023-03-20 ENCOUNTER — Other Ambulatory Visit: Payer: Self-pay | Admitting: Family Medicine

## 2023-03-22 ENCOUNTER — Emergency Department (HOSPITAL_COMMUNITY)
Admission: EM | Admit: 2023-03-22 | Discharge: 2023-03-22 | Disposition: A | Discharge status: Skilled Nursing Facility | Payer: Medicare Other | Attending: Emergency Medicine | Admitting: Emergency Medicine

## 2023-03-22 ENCOUNTER — Other Ambulatory Visit: Payer: Self-pay

## 2023-03-22 DIAGNOSIS — I4430 Unspecified atrioventricular block: Secondary | ICD-10-CM | POA: Diagnosis not present

## 2023-03-22 DIAGNOSIS — Z79899 Other long term (current) drug therapy: Secondary | ICD-10-CM | POA: Diagnosis not present

## 2023-03-22 DIAGNOSIS — I440 Atrioventricular block, first degree: Secondary | ICD-10-CM | POA: Diagnosis not present

## 2023-03-22 DIAGNOSIS — R531 Weakness: Secondary | ICD-10-CM | POA: Diagnosis not present

## 2023-03-22 DIAGNOSIS — E119 Type 2 diabetes mellitus without complications: Secondary | ICD-10-CM | POA: Insufficient documentation

## 2023-03-22 DIAGNOSIS — N390 Urinary tract infection, site not specified: Secondary | ICD-10-CM | POA: Insufficient documentation

## 2023-03-22 DIAGNOSIS — Z7984 Long term (current) use of oral hypoglycemic drugs: Secondary | ICD-10-CM | POA: Diagnosis not present

## 2023-03-22 DIAGNOSIS — I10 Essential (primary) hypertension: Secondary | ICD-10-CM | POA: Insufficient documentation

## 2023-03-22 DIAGNOSIS — I491 Atrial premature depolarization: Secondary | ICD-10-CM | POA: Diagnosis not present

## 2023-03-22 LAB — COMPREHENSIVE METABOLIC PANEL
ALT: 15 U/L (ref 0–44)
AST: 22 U/L (ref 15–41)
Albumin: 4 g/dL (ref 3.5–5.0)
Alkaline Phosphatase: 76 U/L (ref 38–126)
Anion gap: 12 (ref 5–15)
BUN: 18 mg/dL (ref 8–23)
CO2: 24 mmol/L (ref 22–32)
Calcium: 9.6 mg/dL (ref 8.9–10.3)
Chloride: 95 mmol/L — ABNORMAL LOW (ref 98–111)
Creatinine, Ser: 0.77 mg/dL (ref 0.44–1.00)
GFR, Estimated: >60 mL/min (ref >60)
Glucose, Bld: 128 mg/dL — ABNORMAL HIGH (ref 70–99)
Potassium: 4.6 mmol/L (ref 3.5–5.1)
Sodium: 131 mmol/L — ABNORMAL LOW (ref 135–145)
Total Bilirubin: 1 mg/dL (ref 0.3–1.2)
Total Protein: 6.9 g/dL (ref 6.5–8.1)

## 2023-03-22 LAB — URINALYSIS, ROUTINE W REFLEX MICROSCOPIC
Bilirubin Urine: NEGATIVE
Glucose, UA: NEGATIVE mg/dL
Hgb urine dipstick: NEGATIVE
Ketones, ur: NEGATIVE mg/dL
Nitrite: NEGATIVE
Protein, ur: 100 mg/dL — AB
Specific Gravity, Urine: 1.006 (ref 1.005–1.030)
WBC, UA: >50 WBC/hpf (ref 0–5)
pH: 7 (ref 5.0–8.0)

## 2023-03-22 LAB — LACTIC ACID, PLASMA: Lactic Acid, Venous: 1.7 mmol/L (ref 0.5–1.9)

## 2023-03-22 LAB — CBC
HCT: 40.5 % (ref 36.0–46.0)
Hemoglobin: 13.2 g/dL (ref 12.0–15.0)
MCH: 28.8 pg (ref 26.0–34.0)
MCHC: 32.6 g/dL (ref 30.0–36.0)
MCV: 88.2 fL (ref 80.0–100.0)
Platelets: 278 10*3/uL (ref 150–400)
RBC: 4.59 MIL/uL (ref 3.87–5.11)
RDW: 13.9 % (ref 11.5–15.5)
WBC: 9.5 10*3/uL (ref 4.0–10.5)
nRBC: 0 % (ref 0.0–0.2)

## 2023-03-22 MED ORDER — SODIUM CHLORIDE 0.9 % IV BOLUS
500.0000 mL | Freq: Once | INTRAVENOUS | Status: AC
  Administered 2023-03-22: 500 mL via INTRAVENOUS

## 2023-03-22 MED ORDER — CEPHALEXIN 500 MG PO CAPS: 500.0000 mg | ORAL_CAPSULE | Freq: Three times a day (TID) | ORAL | 0 refills | Status: AC

## 2023-03-22 MED ORDER — CEPHALEXIN 500 MG PO CAPS
500.0000 mg | ORAL_CAPSULE | Freq: Once | ORAL | Status: AC
  Administered 2023-03-22: 500 mg via ORAL
  Filled 2023-03-22: qty 1

## 2023-03-22 MED ORDER — ONDANSETRON HCL 4 MG/2ML IJ SOLN
4.0000 mg | Freq: Once | INTRAMUSCULAR | Status: AC
  Administered 2023-03-22: 4 mg via INTRAVENOUS
  Filled 2023-03-22: qty 2

## 2023-03-22 MED ORDER — DIPHENHYDRAMINE HCL 25 MG PO CAPS
25.0000 mg | ORAL_CAPSULE | Freq: Once | ORAL | Status: AC
  Administered 2023-03-22: 25 mg via ORAL
  Filled 2023-03-22: qty 1

## 2023-03-22 NOTE — ED Notes (Signed)
Hi

## 2023-03-22 NOTE — ED Notes (Signed)
[facility] to transport pt back to facility

## 2023-03-22 NOTE — Discharge Instructions (Addendum)
You were treated today for UTI.  Thank you for letting us treat you today. After reviewing your labs, I feel you are safe to go home. Please follow up with your PCP in the next several days and provide them with your records from this visit. Return to the Emergency Room if pain becomes severe or symptoms worsen.

## 2023-03-22 NOTE — ED Notes (Signed)
RN removed pt from all monitoring

## 2023-03-22 NOTE — ED Triage Notes (Signed)
Pt arrived [facility] for c/o prolong weakness, and loose stools that started to day. Pt uses a walker to ambulate and she said she just didn't have it in her to use it today.

## 2023-03-22 NOTE — ED Provider Notes (Signed)
[place] EMERGENCY DEPARTMENT AT [facility] Provider Note   CSN: [id number] Arrival date & time: 03/22/23  1652     History  Chief Complaint  Patient presents with   Weakness    [name] is a 89 y.o. female has medical history significant for hypertension, hyperlipidemia, diabetes and mild cognitive dysfunction presenting today with generalized weakness.  This weakness has been present for the past 2 to 3 months, she has been seen in both the [facility] and [facility] emergency departments for similar symptoms.  She was hospitalized and discharged on 03/13/2023 for suspected UTI but urine culture came back negative.  She also had hyponatremia at that time which was resolved before discharge.  She reports that he may be able to get out of her chair today and previously being able to walk quite a distance daily.  She denies sore throat, fever, headache, dysuria or frequency.  She endorses diffuse abdominal tenderness, chills, nausea and diarrhea x8 today.  She also endorses a reduced oral intake.   Weakness Associated symptoms: abdominal pain, diarrhea and nausea   Associated symptoms: no dysuria, no fever, no frequency, no headaches, no shortness of breath and no vomiting        Home Medications Prior to Admission medications   Medication Sig Start Date End Date Taking? Authorizing Provider  cephALEXin (KEFLEX) 500 MG capsule Take 1 capsule (500 mg total) by mouth 3 (three) times daily for 7 days. 03/22/23 03/29/23 Yes Dolphus Jenny, PA-C  acetaminophen (PAIN RELIEF EXTRA STRENGTH) 500 MG tablet TAKE (2) TABLETS BY MOUTH EVERY SIX HOURS A NEEDED FOR PAIN. Patient taking differently: Take 1,000 mg by mouth every 6 (six) hours as needed for moderate pain. 01/15/23   Babs Sciara, MD  ALPRAZolam (XANAX) 0.25 MG tablet TAKE (1) TABLET BY MOUTH AT BEDTIME AS NEEDED UNTIL GONE. FOR SHORT-TERM USE ONLY. Patient taking differently: Take 0.25 mg by mouth at bedtime as  needed for sleep. 02/25/23   Babs Sciara, MD  amLODipine (NORVASC) 10 MG tablet Take 1 tablet (10 mg total) by mouth daily. 12/02/22   Emokpae, Courage, MD  benzonatate (TESSALON) 100 MG capsule TAKE (1) CAPSULE BY MOUTH 3 TIMES DAILY AS NEEDED FOR COUGH. Patient not taking: Reported on 03/17/2023 11/21/22   Babs Sciara, MD  BETA CAROTENE PROVITAMIN A 63875 units capsule Take 25,000 Units by mouth daily. 01/01/23   [provider]  beta carotene w/minerals (OCUVITE) tablet Take 1 tablet by mouth at bedtime. Patient taking differently: Take 1 tablet by mouth daily. 07/03/22   Babs Sciara, MD  BISACODYL 5 MG EC tablet TAKE 1 TABLET BY MOUTH ONCE DAILY AS NEEDED FOR MODERATE CONSTIPATION. Patient taking differently: Take 5 mg by mouth daily as needed for moderate constipation. 12/20/22   Babs Sciara, MD  Carboxymethylcellulose Sodium (THERATEARS) 0.25 % SOLN Apply 1 drop to eye in the morning and at bedtime.    [provider]  COMBIVENT RESPIMAT 20-100 MCG/ACT AERS respimat INHALE (1) PUFF INTO THE LUNGS EVERY SIX HOURS AS NEEDED FOR WHEEZING OR SHORTNESS OF BREATH. Patient taking differently: Inhale 1 puff into the lungs every 6 (six) hours as needed for wheezing or shortness of breath. 11/21/22   Babs Sciara, MD  cyanocobalamin 1000 MCG tablet Take 1,000 mcg by mouth at bedtime.    [provider]  dicyclomine (BENTYL) 10 MG capsule TAKE 1 CAPSULE BY MOUTH TWICE DAILY AS NEEDED FOR ABDOMINAL CRAMPS  AND LOOSE STOOL. Patient taking differently: Take 10 mg by mouth 2 (two) times daily as needed (abdominal cramps and loose stools.). TAKE 1 CAPSULE BY MOUTH TWICE DAILY AS NEEDED FOR ABDOMINAL CRAMPS AND LOOSE STOOL. 11/21/22   Babs Sciara, MD  EASYMAX TEST test strip CHECK BLOOD SUGAR ONCE DAILY.(CALL MD IF BS BELOW 60: OR IF BS ABOVE 400) 02/03/23   Luking, [name], MD  gabapentin (NEURONTIN) 100 MG capsule 2 po bid Patient taking differently: Take 200 mg by mouth 2  (two) times daily. 11/19/22   Babs Sciara, MD  GOODSENSE HEMORRHOIDAL 0.25-14-74.9 % rectal ointment Place 1 Application rectally 3 (three) times daily as needed for irritation. 12/26/21   [provider]  Iron, Ferrous Sulfate, 325 (65 Fe) MG TABS Take 325 mg by mouth every other day. 02/20/23   Babs Sciara, MD  loperamide (ANTI-DIARRHEAL) 2 MG tablet TAKE 1 TABLET BY MOUTH AS NEEDED FOR DIARRHEA OR LOOSE STOOL**MAX 4 TABLETS IN 24 HOURS** Patient taking differently: Take 2 mg by mouth 4 (four) times daily as needed for diarrhea or loose stools. 12/27/22   Babs Sciara, MD  loratadine (CLARITIN) 10 MG tablet Take 10 mg by mouth daily.    [provider]  losartan (COZAAR) 100 MG tablet Take 1 tablet daily Patient taking differently: Take 100 mg by mouth daily. 12/02/22   Shon Hale, MD  meclizine (ANTIVERT) 25 MG tablet Take 1 tablet (25 mg total) by mouth 3 (three) times daily as needed for dizziness or nausea. 01/24/23   Babs Sciara, MD  melatonin 3 MG TABS tablet Take 3 mg by mouth at bedtime.    [provider]  metFORMIN (GLUCOPHAGE) 500 MG tablet Take one tablet 500mg  po in the morning and 1/2 tablet 250 mg po at supper Patient taking differently: Take 750 mg by mouth daily. Take one tablet 500mg  po in the morning and 1/2 tablet 250 mg po at supper 12/02/22   Shon Hale, MD  Multiple Vitamins-Minerals (PRESERVISION AREDS 2) CAPS Take 1 capsule by mouth in the morning and at bedtime.    [provider]  Omega-3 Fatty Acids (FISH OIL) 1000 MG CAPS One capsule BID Patient taking differently: Take 1 capsule by mouth in the morning and at bedtime. 08/07/21   Babs Sciara, MD  pantoprazole (PROTONIX) 40 MG tablet Take 1 tablet (40 mg total) by mouth daily. 02/11/23   Babs Sciara, MD  Vitamin D, Cholecalciferol, 10 MCG (400 UNIT) TABS TAKE 1 TABLET BY MOUTH ONCE DAILY. 05/12/20   Babs Sciara, MD      Allergies    Hydrocortisone,  Lisinopril, Phenergan [promethazine hcl], Statins, Trazodone and nefazodone, and Prednisone    Review of Systems   Review of Systems  Constitutional:  Positive for activity change, appetite change and chills. Negative for fever.  Respiratory:  Negative for shortness of breath.   Gastrointestinal:  Positive for abdominal pain, diarrhea and nausea. Negative for blood in stool and vomiting.  Genitourinary:  Negative for dysuria and frequency.  Neurological:  Positive for weakness. Negative for light-headedness and headaches.    Physical Exam Updated Vital Signs BP (!) 170/151   Pulse 76   Temp 98 F (36.7 C) (Oral)   Resp 20   Ht 5\' 8"  (1.727 m)   Wt 74.4 kg   SpO2 94%   BMI 24.94 kg/m  Physical Exam Vitals and nursing note reviewed.  Constitutional:  General: She is not in acute distress.    Appearance: She is well-developed.  HENT:     Head: Normocephalic and atraumatic.     Mouth/Throat:     Mouth: Mucous membranes are moist.     Pharynx: Oropharynx is clear.  Eyes:     Conjunctiva/sclera: Conjunctivae normal.     Pupils: Pupils are equal, round, and reactive to light.  Cardiovascular:     Rate and Rhythm: Normal rate and regular rhythm.     Heart sounds: No murmur heard. Pulmonary:     Effort: Pulmonary effort is normal. No respiratory distress.     Breath sounds: Normal breath sounds.  Abdominal:     Palpations: Abdomen is soft.     Tenderness: There is no abdominal tenderness. There is no guarding or rebound.  Musculoskeletal:        General: No swelling.     Cervical back: Neck supple.  Skin:    General: Skin is warm and dry.     Capillary Refill: Capillary refill takes less than 2 seconds.  Neurological:     General: No focal deficit present.     Mental Status: She is alert and oriented to person, place, and time. Mental status is at baseline.  Psychiatric:        Mood and Affect: Mood normal.     ED Results / Procedures / Treatments   Labs (all  labs ordered are listed, but only abnormal results are displayed) Labs Reviewed  URINALYSIS, ROUTINE W REFLEX MICROSCOPIC - Abnormal; Notable for the following components:      Result Value   APPearance CLOUDY (*)    Protein, ur 100 (*)    Leukocytes,Ua LARGE (*)    Bacteria, UA RARE (*)    All other components within normal limits  COMPREHENSIVE METABOLIC PANEL - Abnormal; Notable for the following components:   Sodium 131 (*)    Chloride 95 (*)    Glucose, Bld 128 (*)    All other components within normal limits  URINE CULTURE  CBC  LACTIC ACID, PLASMA  CBG MONITORING, ED    EKG EKG Interpretation Date/Time:  Saturday March 22 2023 17:13:20 EDT Ventricular Rate:  76 PR Interval:  188 QRS Duration:  80 QT Interval:  374 QTC Calculation: 421 R Axis:   56  Text Interpretation: Age not entered, assumed to be  89 years old for purpose of ECG interpretation Sinus rhythm Multiple premature complexes, vent & supraven Anterior infarct, old Confirmed by Eber Hong ([id number]) on 03/22/2023 5:31:28 PM  Radiology No results found.  Procedures Procedures    Medications Ordered in ED Medications  ondansetron (ZOFRAN) injection 4 mg (4 mg Intravenous Given 03/22/23 1907)  sodium chloride 0.9 % bolus 500 mL (0 mLs Intravenous Stopped 03/22/23 2025)  cephALEXin (KEFLEX) capsule 500 mg (500 mg Oral Given 03/22/23 2025)  diphenhydrAMINE (BENADRYL) capsule 25 mg (25 mg Oral Given 03/22/23 2035)    ED Course/ Medical Decision Making/ A&P                                 Medical Decision Making Amount and/or Complexity of Data Reviewed Labs: ordered.  Risk Prescription drug management.  This patient presents to the ED with chief complaint(s) of generalized weakness with pertinent past medical history of hypertension, hyperlipidemia, diabetes, and mild cognitive dysfunction which further complicates the presenting complaint. The complaint involves an extensive differential diagnosis  and also carries with it a high risk of complications and morbidity.    The differential diagnosis includes urinary tract infection, anemia, MI  Additional history obtained: Additional history obtained from family patient's son, previous UTI history. Records reviewed Primary Care Documents previous family medicine office visits  ED Course and Reassessment:  Patient was started on cephalexin.  Patient was concerned about sodium levels.  We discussed adequate oral intake and electrolyte drinks.  Independent labs interpretation:  The following labs were independently interpreted: CBC which was unremarkable showed no leukocytosis.  Lactic acid which was within normal range.  CMP which showed slight hyponatremia at 131 likely caused by decrease in oral intake.  Urinary analysis which showed leukocytes and bacteria.  Urine culture sent  Independent visualization of imaging: - I independently visualized the following imaging with scope of interpretation limited to determining acute life threatening conditions related to emergency care: EKG, which revealed mature complexes and old anterior infarct.  Consultation: - Consulted or discussed management/test interpretation w/ external professional: None  Consideration for admission or further workup: Was considered for admission however vital signs were stable throughout ER visit.  Patient recommended for further workup with urology.         Final Clinical Impression(s) / ED Diagnoses Final diagnoses:  Generalized weakness  Lower urinary tract infectious disease    Rx / DC Orders ED Discharge Orders          Ordered    cephALEXin (KEFLEX) 500 MG capsule  3 times daily        03/22/23 2032              Dolphus Jenny, PA-C 03/22/23 2041    Eber Hong, MD 03/24/23 [phone]

## 2023-03-24 LAB — CBG MONITORING, ED: Glucose-Capillary: 147 mg/dL — ABNORMAL HIGH (ref 70–99)

## 2023-03-25 ENCOUNTER — Ambulatory Visit: Payer: Medicare Other | Admitting: Family Medicine

## 2023-03-25 VITALS — BP 130/70 | HR 65 | Temp 97.3°F | Ht 68.0 in | Wt 164.0 lb

## 2023-03-25 DIAGNOSIS — E876 Hypokalemia: Secondary | ICD-10-CM | POA: Diagnosis not present

## 2023-03-25 DIAGNOSIS — F439 Reaction to severe stress, unspecified: Secondary | ICD-10-CM | POA: Diagnosis not present

## 2023-03-25 DIAGNOSIS — E871 Hypo-osmolality and hyponatremia: Secondary | ICD-10-CM | POA: Diagnosis not present

## 2023-03-25 DIAGNOSIS — N390 Urinary tract infection, site not specified: Secondary | ICD-10-CM | POA: Diagnosis not present

## 2023-03-25 DIAGNOSIS — Z23 Encounter for immunization: Secondary | ICD-10-CM

## 2023-03-25 LAB — URINE CULTURE: Culture: >=100000 — AB

## 2023-03-25 NOTE — Progress Notes (Signed)
Subjective:    Patient ID: [name], female    DOB: September 23, 1931, 89 y.o.   MRN: [id number]  HPI Follow up for UTI- re-ocurrent - fatigue and small and frequent urination Started keflex yesterday morning  Has an appt with urology for 04/21/23 Dr Janifer Adie Discuss lab results to include sodium Advise regarding cranberry pills to help with uti  We did discuss how bacteria in the urine does not necessarily mean a UTI.  We try to face that more so around true urinary symptoms or fever that would not be accounted by anything else other than a UTI Patient states she wonders why she cannot just have an antibiotic she will take whenever she thinks she has a problem.  I have encouraged that it be better if she was seen and evaluated. Review of Systems     Objective:   Physical Exam  General-in no acute distress Eyes-no discharge Lungs-respiratory rate normal, CTA CV-no murmurs,RRR Extremities skin warm dry no edema Neuro grossly normal Behavior normal, alert       Assessment & Plan:  1. Hyponatremia Recheck metabolic 7 patient is drinking water throughout the day and also under Armour electrolyte solution I have encouraged her to try to be careful not to consume more than 48 ounces per day total we will recheck the metabolic 7 within 2 weeks - Basic Metabolic Panel  2. Frequent UTI Patient has frequent UTIs She is identifying that every time she feels fatigued she has a UTI.  I tried to encourage her to look up on this more so as if she has burning or other urinary symptoms then it would be a UTI or fever.  I believe she has a lot of chronic urinary bacteria that is consistent with bacteruria - UA/M w/rflx Culture, Routine  3. Hypokalemia Recheck lab work await results - Basic Metabolic Panel  4. Immunization due Flu shot - Flu Vaccine Trivalent High Dose (Fluad)  ER hospital follow-up  Patient also having stress and worry related to getting older wondering if she is  going to do okay she would benefit from some counseling I believe that this has contributed to some of her spells which landed her in the ER but certainly at the same time and is absolutely necessary to check out every one of the spells to make sure sure something else is not happening

## 2023-03-26 ENCOUNTER — Telehealth: Payer: Self-pay

## 2023-03-26 ENCOUNTER — Telehealth (HOSPITAL_BASED_OUTPATIENT_CLINIC_OR_DEPARTMENT_OTHER): Payer: Self-pay

## 2023-03-26 NOTE — Telephone Encounter (Signed)
Please let them know that that would be fine with me for them to see her and I just simply request that any medications that they do prescribe that we are sent a message regarding that so we are aware of what medicine she is taking thank you

## 2023-03-26 NOTE — Telephone Encounter (Signed)
Post ED Visit - Positive Culture Follow-up: Successful Patient Follow-Up  Culture assessed and recommendations reviewed by:  []  Enzo Bi, Pharm.D. []  Celedonio Miyamoto, [address].D., BCPS AQ-ID [x]  Wilburn Cornelia, Pharm.D., BCPS []  Georgina Pillion, Pharm.D., BCPS []  [place], [address].D., BCPS, AAHIVP []  Estella Husk, Pharm.D., BCPS, AAHIVP []  Lysle Pearl, PharmD, BCPS []  Phillips Climes, PharmD, BCPS []  Agapito Games, PharmD, BCPS []  Verlan Friends, PharmD  Positive urine culture  []  Patient discharged without antimicrobial prescription and treatment is now indicated [x]  Organism is resistant to prescribed ED discharge antimicrobial []  Patient with positive blood cultures  Changes discussed with ED provider: Valrie Hart, PA-C New antibiotic prescription Bactrim DS 1 tab po BID x 5 days Called to [place] at [facility] facility  Contacted patient caregiver, date 03/26/2023, time 1:28 pm   Sandria Senter 03/26/2023, 1:29 PM

## 2023-03-26 NOTE — Telephone Encounter (Signed)
[name] with [facility] is calling regarding the in house psychiatrist , the patient would like to see the provider due to family concerns. The in- house provider is able to prescribe medications to the residents as well, no referral Is needed.

## 2023-03-26 NOTE — Progress Notes (Signed)
ED Antimicrobial Stewardship Positive Culture Follow Up   [name] is an 89 y.o. female who presented to [facility] on 03/22/2023 with a chief complaint of  Chief Complaint  Patient presents with   Weakness    Recent Results (from the past 720 hour(s))  SARS Coronavirus 2 by RT PCR (hospital order, performed in [facility] hospital lab) *cepheid single result test* Anterior Nasal Swab     Status: None   Collection Time: 03/07/23  9:48 AM   Specimen: Anterior Nasal Swab  Result Value Ref Range Status   SARS Coronavirus 2 by RT PCR NEGATIVE NEGATIVE Final    Comment: (NOTE) SARS-CoV-2 target nucleic acids are NOT DETECTED.  The SARS-CoV-2 RNA is generally detectable in upper and lower respiratory specimens during the acute phase of infection. The lowest concentration of SARS-CoV-2 viral copies this assay can detect is 250 copies / mL. A negative result does not preclude SARS-CoV-2 infection and should not be used as the sole basis for treatment or other patient management decisions.  A negative result may occur with improper specimen collection / handling, submission of specimen other than nasopharyngeal swab, presence of viral mutation(s) within the areas targeted by this assay, and inadequate number of viral copies (<250 copies / mL). A negative result must be combined with clinical observations, patient history, and epidemiological information.  Fact Sheet for Patients:   RoadLapTop.co.za  Fact Sheet for Healthcare Providers: http://kim-miller.com/  This test is not yet approved or  cleared by the [place] FDA and has been authorized for detection and/or diagnosis of SARS-CoV-2 by FDA under an Emergency Use Authorization (EUA).  This EUA will remain in effect (meaning this test can be used) for the duration of the COVID-19 declaration under Section 564(b)(1) of the Act, 21 U.S.C. section 360bbb-3(b)(1), unless the  authorization is terminated or revoked sooner.  Performed at [facility], [address] [place], [place] [address]   Urine Culture (for pregnant, neutropenic or urologic patients or patients with an indwelling urinary catheter)     Status: Abnormal   Collection Time: 03/10/23 11:00 AM   Specimen: Urine, Clean Catch  Result Value Ref Range Status   Specimen Description   Final    URINE, CLEAN CATCH Performed at [facility], [address] [place], [place] [address]    Special Requests   Final    NONE Performed at [facility], [address], [place], [place] [address]    Culture (A)  Final    <10,000 COLONIES/mL INSIGNIFICANT GROWTH Performed at [facility], [address] [place], [place] [address]    Report Status 03/11/2023 FINAL  Final  Resp panel by RT-PCR (RSV, Flu A&B, Covid) Anterior Nasal Swab     Status: None   Collection Time: 03/16/23 11:52 AM   Specimen: Anterior Nasal Swab  Result Value Ref Range Status   SARS Coronavirus 2 by RT PCR NEGATIVE NEGATIVE Final    Comment: (NOTE) SARS-CoV-2 target nucleic acids are NOT DETECTED.  The SARS-CoV-2 RNA is generally detectable in upper respiratory specimens during the acute phase of infection. The lowest concentration of SARS-CoV-2 viral copies this assay can detect is 138 copies/mL. A negative result does not preclude SARS-Cov-2 infection and should not be used as the sole basis for treatment or other patient management decisions. A negative result may occur with  improper specimen collection/handling, submission of specimen other than nasopharyngeal swab, presence of viral mutation(s) within the areas  targeted by this assay, and inadequate number of viral copies(<138 copies/mL). A negative result must be combined with clinical observations, patient history, and epidemiological information. The expected result is Negative.  Fact Sheet for Patients:   BloggerCourse.com  Fact Sheet for Healthcare Providers:  SeriousBroker.it  This test is no t yet approved or cleared by the [place] FDA and  has been authorized for detection and/or diagnosis of SARS-CoV-2 by FDA under an Emergency Use Authorization (EUA). This EUA will remain  in effect (meaning this test can be used) for the duration of the COVID-19 declaration under Section 564(b)(1) of the Act, 21 U.S.C.section 360bbb-3(b)(1), unless the authorization is terminated  or revoked sooner.       Influenza A by PCR NEGATIVE NEGATIVE Final   Influenza B by PCR NEGATIVE NEGATIVE Final    Comment: (NOTE) The Xpert Xpress SARS-CoV-2/FLU/RSV plus assay is intended as an aid in the diagnosis of influenza from Nasopharyngeal swab specimens and should not be used as a sole basis for treatment. Nasal washings and aspirates are unacceptable for Xpert Xpress SARS-CoV-2/FLU/RSV testing.  Fact Sheet for Patients: BloggerCourse.com  Fact Sheet for Healthcare Providers: SeriousBroker.it  This test is not yet approved or cleared by the [place] FDA and has been authorized for detection and/or diagnosis of SARS-CoV-2 by FDA under an Emergency Use Authorization (EUA). This EUA will remain in effect (meaning this test can be used) for the duration of the COVID-19 declaration under Section 564(b)(1) of the Act, 21 U.S.C. section 360bbb-3(b)(1), unless the authorization is terminated or revoked.     Resp Syncytial Virus by PCR NEGATIVE NEGATIVE Final    Comment: (NOTE) Fact Sheet for Patients: BloggerCourse.com  Fact Sheet for Healthcare Providers: SeriousBroker.it  This test is not yet approved or cleared by the [place] FDA and has been authorized for detection and/or diagnosis of SARS-CoV-2 by FDA under an Emergency Use  Authorization (EUA). This EUA will remain in effect (meaning this test can be used) for the duration of the COVID-19 declaration under Section 564(b)(1) of the Act, 21 U.S.C. section 360bbb-3(b)(1), unless the authorization is terminated or revoked.  Performed at [facility], [address] [place], [place] [address]   Urine Culture     Status: Abnormal   Collection Time: 03/22/23  5:36 PM   Specimen: Urine, Clean Catch  Result Value Ref Range Status   Specimen Description   Final    URINE, CLEAN CATCH Performed at [facility], [address], [place], [place] [address]    Special Requests   Final    NONE Performed at [facility], [address], [place], [place] [address]    Culture (A)  Final    >=100,000 COLONIES/mL MORGANELLA MORGANII 20,000 COLONIES/mL KLEBSIELLA PNEUMONIAE    Report Status 03/25/2023 FINAL  Final   Organism ID, Bacteria MORGANELLA MORGANII (A)  Final   Organism ID, Bacteria KLEBSIELLA PNEUMONIAE (A)  Final      Susceptibility   Klebsiella pneumoniae - MIC*    AMPICILLIN >=32 RESISTANT Resistant     CEFAZOLIN 16 SENSITIVE Sensitive     CEFEPIME <=0.12 SENSITIVE Sensitive     CEFTRIAXONE <=0.25 SENSITIVE Sensitive     CIPROFLOXACIN <=0.25 SENSITIVE Sensitive     GENTAMICIN <=1 SENSITIVE Sensitive     IMIPENEM <=0.25 SENSITIVE Sensitive     NITROFURANTOIN 32 SENSITIVE Sensitive     TRIMETH/SULFA <=20 SENSITIVE Sensitive     AMPICILLIN/SULBACTAM >=32 RESISTANT Resistant     PIP/TAZO  32 INTERMEDIATE Intermediate     * 20,000 COLONIES/mL KLEBSIELLA PNEUMONIAE   Morganella morganii - MIC*    AMPICILLIN >=32 RESISTANT Resistant     CIPROFLOXACIN <=0.25 SENSITIVE Sensitive     GENTAMICIN 2 SENSITIVE Sensitive     IMIPENEM 1 SENSITIVE Sensitive     NITROFURANTOIN RESISTANT Resistant     TRIMETH/SULFA <=20 SENSITIVE Sensitive     AMPICILLIN/SULBACTAM >=32 RESISTANT Resistant     PIP/TAZO <=4 SENSITIVE Sensitive     * >=100,000  COLONIES/mL MORGANELLA MORGANII    [x]  Treated with cephalexin, Moranella organism resistant to prescribed antimicrobial  New antibiotic prescription: STOP cephalexin. START Bactrim DS 1 tablet po BID x 5 days  ED Provider: Valrie Hart, PA-C   Doristine Counter, PharmD, BCPS 03/26/2023, 9:18 AM Clinical Pharmacist Monday - Friday phone -  [phone] Saturday - Sunday phone - [phone]

## 2023-03-27 NOTE — Telephone Encounter (Signed)
Spoke with Hospital doctor at [facility] to [employer]. She verbalizes understanding.

## 2023-04-03 ENCOUNTER — Telehealth: Payer: Self-pay | Admitting: Family Medicine

## 2023-04-03 NOTE — Telephone Encounter (Signed)
[name] from [facility] called to let you know patient had to stop taking Zoloft 25 mbecause it was making her sick.

## 2023-04-04 DIAGNOSIS — N390 Urinary tract infection, site not specified: Secondary | ICD-10-CM | POA: Diagnosis not present

## 2023-04-04 DIAGNOSIS — E871 Hypo-osmolality and hyponatremia: Secondary | ICD-10-CM | POA: Diagnosis not present

## 2023-04-04 DIAGNOSIS — E876 Hypokalemia: Secondary | ICD-10-CM | POA: Diagnosis not present

## 2023-04-04 NOTE — Telephone Encounter (Signed)
So noted thank you 

## 2023-04-06 ENCOUNTER — Emergency Department (EMERGENCY_DEPARTMENT_HOSPITAL)
Admission: EM | Admit: 2023-04-06 | Discharge: 2023-04-06 | Disposition: A | Discharge status: Home / Self Care | Payer: Medicare Other | Source: Home / Self Care

## 2023-04-06 ENCOUNTER — Other Ambulatory Visit: Payer: Self-pay

## 2023-04-06 ENCOUNTER — Emergency Department (HOSPITAL_COMMUNITY): Payer: Medicare Other

## 2023-04-06 DIAGNOSIS — R935 Abnormal findings on diagnostic imaging of other abdominal regions, including retroperitoneum: Secondary | ICD-10-CM | POA: Diagnosis not present

## 2023-04-06 DIAGNOSIS — I10 Essential (primary) hypertension: Secondary | ICD-10-CM | POA: Diagnosis not present

## 2023-04-06 DIAGNOSIS — K222 Esophageal obstruction: Secondary | ICD-10-CM | POA: Diagnosis not present

## 2023-04-06 DIAGNOSIS — I771 Stricture of artery: Secondary | ICD-10-CM | POA: Diagnosis present

## 2023-04-06 DIAGNOSIS — R0602 Shortness of breath: Secondary | ICD-10-CM | POA: Diagnosis not present

## 2023-04-06 DIAGNOSIS — K2970 Gastritis, unspecified, without bleeding: Secondary | ICD-10-CM | POA: Diagnosis not present

## 2023-04-06 DIAGNOSIS — R634 Abnormal weight loss: Secondary | ICD-10-CM | POA: Diagnosis not present

## 2023-04-06 DIAGNOSIS — J3089 Other allergic rhinitis: Secondary | ICD-10-CM | POA: Diagnosis not present

## 2023-04-06 DIAGNOSIS — R531 Weakness: Secondary | ICD-10-CM | POA: Insufficient documentation

## 2023-04-06 DIAGNOSIS — J9601 Acute respiratory failure with hypoxia: Secondary | ICD-10-CM | POA: Diagnosis not present

## 2023-04-06 DIAGNOSIS — E119 Type 2 diabetes mellitus without complications: Secondary | ICD-10-CM | POA: Insufficient documentation

## 2023-04-06 DIAGNOSIS — Z79899 Other long term (current) drug therapy: Secondary | ICD-10-CM | POA: Insufficient documentation

## 2023-04-06 DIAGNOSIS — K449 Diaphragmatic hernia without obstruction or gangrene: Secondary | ICD-10-CM | POA: Diagnosis not present

## 2023-04-06 DIAGNOSIS — R1030 Lower abdominal pain, unspecified: Secondary | ICD-10-CM | POA: Diagnosis not present

## 2023-04-06 DIAGNOSIS — E43 Unspecified severe protein-calorie malnutrition: Secondary | ICD-10-CM | POA: Diagnosis not present

## 2023-04-06 DIAGNOSIS — R197 Diarrhea, unspecified: Secondary | ICD-10-CM | POA: Insufficient documentation

## 2023-04-06 DIAGNOSIS — K219 Gastro-esophageal reflux disease without esophagitis: Secondary | ICD-10-CM | POA: Diagnosis not present

## 2023-04-06 DIAGNOSIS — Z20822 Contact with and (suspected) exposure to covid-19: Secondary | ICD-10-CM | POA: Diagnosis present

## 2023-04-06 DIAGNOSIS — Z1152 Encounter for screening for COVID-19: Secondary | ICD-10-CM | POA: Diagnosis not present

## 2023-04-06 DIAGNOSIS — R6883 Chills (without fever): Secondary | ICD-10-CM | POA: Insufficient documentation

## 2023-04-06 DIAGNOSIS — I774 Celiac artery compression syndrome: Secondary | ICD-10-CM | POA: Diagnosis not present

## 2023-04-06 DIAGNOSIS — F411 Generalized anxiety disorder: Secondary | ICD-10-CM | POA: Diagnosis present

## 2023-04-06 DIAGNOSIS — I700 Atherosclerosis of aorta: Secondary | ICD-10-CM | POA: Diagnosis present

## 2023-04-06 DIAGNOSIS — Z741 Need for assistance with personal care: Secondary | ICD-10-CM | POA: Diagnosis not present

## 2023-04-06 DIAGNOSIS — R918 Other nonspecific abnormal finding of lung field: Secondary | ICD-10-CM | POA: Diagnosis not present

## 2023-04-06 DIAGNOSIS — Z7189 Other specified counseling: Secondary | ICD-10-CM | POA: Diagnosis not present

## 2023-04-06 DIAGNOSIS — K2950 Unspecified chronic gastritis without bleeding: Secondary | ICD-10-CM | POA: Diagnosis not present

## 2023-04-06 DIAGNOSIS — Z7984 Long term (current) use of oral hypoglycemic drugs: Secondary | ICD-10-CM | POA: Insufficient documentation

## 2023-04-06 DIAGNOSIS — K5900 Constipation, unspecified: Secondary | ICD-10-CM | POA: Diagnosis not present

## 2023-04-06 DIAGNOSIS — R110 Nausea: Secondary | ICD-10-CM | POA: Diagnosis not present

## 2023-04-06 DIAGNOSIS — M159 Polyosteoarthritis, unspecified: Secondary | ICD-10-CM | POA: Diagnosis not present

## 2023-04-06 DIAGNOSIS — I119 Hypertensive heart disease without heart failure: Secondary | ICD-10-CM | POA: Diagnosis present

## 2023-04-06 DIAGNOSIS — Z515 Encounter for palliative care: Secondary | ICD-10-CM | POA: Diagnosis not present

## 2023-04-06 DIAGNOSIS — K2990 Gastroduodenitis, unspecified, without bleeding: Secondary | ICD-10-CM | POA: Diagnosis present

## 2023-04-06 DIAGNOSIS — I701 Atherosclerosis of renal artery: Secondary | ICD-10-CM | POA: Diagnosis not present

## 2023-04-06 DIAGNOSIS — J189 Pneumonia, unspecified organism: Secondary | ICD-10-CM | POA: Diagnosis not present

## 2023-04-06 DIAGNOSIS — K8020 Calculus of gallbladder without cholecystitis without obstruction: Secondary | ICD-10-CM | POA: Diagnosis not present

## 2023-04-06 DIAGNOSIS — E1142 Type 2 diabetes mellitus with diabetic polyneuropathy: Secondary | ICD-10-CM | POA: Diagnosis present

## 2023-04-06 DIAGNOSIS — M791 Myalgia, unspecified site: Secondary | ICD-10-CM | POA: Insufficient documentation

## 2023-04-06 DIAGNOSIS — E1159 Type 2 diabetes mellitus with other circulatory complications: Secondary | ICD-10-CM | POA: Diagnosis present

## 2023-04-06 DIAGNOSIS — R109 Unspecified abdominal pain: Secondary | ICD-10-CM | POA: Diagnosis not present

## 2023-04-06 DIAGNOSIS — E871 Hypo-osmolality and hyponatremia: Secondary | ICD-10-CM | POA: Diagnosis present

## 2023-04-06 DIAGNOSIS — I209 Angina pectoris, unspecified: Secondary | ICD-10-CM | POA: Diagnosis not present

## 2023-04-06 DIAGNOSIS — F32A Depression, unspecified: Secondary | ICD-10-CM | POA: Diagnosis not present

## 2023-04-06 DIAGNOSIS — J029 Acute pharyngitis, unspecified: Secondary | ICD-10-CM | POA: Insufficient documentation

## 2023-04-06 DIAGNOSIS — F0390 Unspecified dementia without behavioral disturbance: Secondary | ICD-10-CM | POA: Diagnosis not present

## 2023-04-06 DIAGNOSIS — J168 Pneumonia due to other specified infectious organisms: Secondary | ICD-10-CM | POA: Diagnosis not present

## 2023-04-06 DIAGNOSIS — K5792 Diverticulitis of intestine, part unspecified, without perforation or abscess without bleeding: Secondary | ICD-10-CM | POA: Diagnosis not present

## 2023-04-06 DIAGNOSIS — E785 Hyperlipidemia, unspecified: Secondary | ICD-10-CM | POA: Diagnosis present

## 2023-04-06 DIAGNOSIS — R059 Cough, unspecified: Secondary | ICD-10-CM | POA: Insufficient documentation

## 2023-04-06 DIAGNOSIS — N281 Cyst of kidney, acquired: Secondary | ICD-10-CM | POA: Diagnosis present

## 2023-04-06 DIAGNOSIS — R262 Difficulty in walking, not elsewhere classified: Secondary | ICD-10-CM | POA: Diagnosis not present

## 2023-04-06 DIAGNOSIS — Z66 Do not resuscitate: Secondary | ICD-10-CM | POA: Diagnosis present

## 2023-04-06 DIAGNOSIS — F419 Anxiety disorder, unspecified: Secondary | ICD-10-CM | POA: Diagnosis not present

## 2023-04-06 DIAGNOSIS — K55059 Acute (reversible) ischemia of intestine, part and extent unspecified: Secondary | ICD-10-CM | POA: Diagnosis not present

## 2023-04-06 DIAGNOSIS — Z933 Colostomy status: Secondary | ICD-10-CM | POA: Diagnosis not present

## 2023-04-06 DIAGNOSIS — Q272 Other congenital malformations of renal artery: Secondary | ICD-10-CM | POA: Diagnosis not present

## 2023-04-06 DIAGNOSIS — K649 Unspecified hemorrhoids: Secondary | ICD-10-CM | POA: Diagnosis not present

## 2023-04-06 DIAGNOSIS — Z433 Encounter for attention to colostomy: Secondary | ICD-10-CM | POA: Diagnosis not present

## 2023-04-06 DIAGNOSIS — U071 COVID-19: Secondary | ICD-10-CM | POA: Diagnosis not present

## 2023-04-06 DIAGNOSIS — I517 Cardiomegaly: Secondary | ICD-10-CM | POA: Diagnosis not present

## 2023-04-06 DIAGNOSIS — R0902 Hypoxemia: Secondary | ICD-10-CM | POA: Diagnosis not present

## 2023-04-06 DIAGNOSIS — I152 Hypertension secondary to endocrine disorders: Secondary | ICD-10-CM | POA: Diagnosis not present

## 2023-04-06 DIAGNOSIS — R1084 Generalized abdominal pain: Secondary | ICD-10-CM | POA: Diagnosis not present

## 2023-04-06 DIAGNOSIS — R488 Other symbolic dysfunctions: Secondary | ICD-10-CM | POA: Diagnosis not present

## 2023-04-06 DIAGNOSIS — R638 Other symptoms and signs concerning food and fluid intake: Secondary | ICD-10-CM | POA: Diagnosis not present

## 2023-04-06 DIAGNOSIS — F5101 Primary insomnia: Secondary | ICD-10-CM | POA: Diagnosis not present

## 2023-04-06 DIAGNOSIS — K551 Chronic vascular disorders of intestine: Secondary | ICD-10-CM | POA: Diagnosis not present

## 2023-04-06 DIAGNOSIS — R627 Adult failure to thrive: Secondary | ICD-10-CM | POA: Diagnosis present

## 2023-04-06 DIAGNOSIS — M6281 Muscle weakness (generalized): Secondary | ICD-10-CM | POA: Diagnosis not present

## 2023-04-06 LAB — UA/M W/RFLX CULTURE, ROUTINE
Bilirubin, UA: NEGATIVE
Glucose, UA: NEGATIVE
Ketones, UA: NEGATIVE
Nitrite, UA: NEGATIVE
RBC, UA: NEGATIVE
Specific Gravity, UA: 1.013 (ref 1.005–1.030)
Urobilinogen, Ur: 0.2 mg/dL (ref 0.2–1.0)
pH, UA: 6 (ref 5.0–7.5)

## 2023-04-06 LAB — URINE CULTURE, REFLEX

## 2023-04-06 LAB — BASIC METABOLIC PANEL
Anion gap: 8 (ref 5–15)
BUN/Creatinine Ratio: 22 (ref 12–28)
BUN: 17 mg/dL (ref 8–27)
BUN: 14 mg/dL (ref 8–23)
CO2: 25 mmol/L (ref 20–29)
CO2: 29 mmol/L (ref 22–32)
Calcium: 9.8 mg/dL (ref 8.7–10.3)
Calcium: 8.8 mg/dL — ABNORMAL LOW (ref 8.9–10.3)
Chloride: 90 mmol/L — ABNORMAL LOW (ref 96–106)
Chloride: 89 mmol/L — ABNORMAL LOW (ref 98–111)
Creatinine, Ser: 0.76 mg/dL (ref 0.57–1.00)
Creatinine, Ser: 0.71 mg/dL (ref 0.44–1.00)
GFR, Estimated: >60 mL/min (ref >60)
Glucose, Bld: 145 mg/dL — ABNORMAL HIGH (ref 70–99)
Glucose: 132 mg/dL — ABNORMAL HIGH (ref 70–99)
Potassium: 5 mmol/L (ref 3.5–5.2)
Potassium: 4.1 mmol/L (ref 3.5–5.1)
Sodium: 131 mmol/L — ABNORMAL LOW (ref 134–144)
Sodium: 126 mmol/L — ABNORMAL LOW (ref 135–145)
eGFR: 75 mL/min/{1.73_m2} (ref >59)

## 2023-04-06 LAB — URINALYSIS, ROUTINE W REFLEX MICROSCOPIC
Bacteria, UA: NONE SEEN
Bilirubin Urine: NEGATIVE
Glucose, UA: NEGATIVE mg/dL
Hgb urine dipstick: NEGATIVE
Ketones, ur: NEGATIVE mg/dL
Leukocytes,Ua: NEGATIVE
Nitrite: NEGATIVE
Protein, ur: 100 mg/dL — AB
Specific Gravity, Urine: 1.012 (ref 1.005–1.030)
pH: 6 (ref 5.0–8.0)

## 2023-04-06 LAB — CBC WITH DIFFERENTIAL/PLATELET
Abs Immature Granulocytes: 0.04 10*3/uL (ref 0.00–0.07)
Basophils Absolute: 0 10*3/uL (ref 0.0–0.1)
Basophils Relative: 0 %
Eosinophils Absolute: 0.1 10*3/uL (ref 0.0–0.5)
Eosinophils Relative: 1 %
HCT: 36.6 % (ref 36.0–46.0)
Hemoglobin: 12.4 g/dL (ref 12.0–15.0)
Immature Granulocytes: 0 %
Lymphocytes Relative: 10 %
Lymphs Abs: 0.9 10*3/uL (ref 0.7–4.0)
MCH: 29.8 pg (ref 26.0–34.0)
MCHC: 33.9 g/dL (ref 30.0–36.0)
MCV: 88 fL (ref 80.0–100.0)
Monocytes Absolute: 0.7 10*3/uL (ref 0.1–1.0)
Monocytes Relative: 7 %
Neutro Abs: 7.9 10*3/uL — ABNORMAL HIGH (ref 1.7–7.7)
Neutrophils Relative %: 82 %
Platelets: 249 10*3/uL (ref 150–400)
RBC: 4.16 MIL/uL (ref 3.87–5.11)
RDW: 13.6 % (ref 11.5–15.5)
WBC: 9.6 10*3/uL (ref 4.0–10.5)
nRBC: 0 % (ref 0.0–0.2)

## 2023-04-06 LAB — SARS CORONAVIRUS 2 BY RT PCR: SARS Coronavirus 2 by RT PCR: NEGATIVE

## 2023-04-06 LAB — MICROSCOPIC EXAMINATION
Bacteria, UA: NONE SEEN
Casts: NONE SEEN /LPF

## 2023-04-06 MED ORDER — PROCHLORPERAZINE MALEATE 10 MG PO TABS: 10.0000 mg | ORAL_TABLET | Freq: Four times a day (QID) | ORAL | 0 refills | Status: DC | PRN

## 2023-04-06 MED ORDER — ONDANSETRON HCL 4 MG/2ML IJ SOLN
4.0000 mg | Freq: Once | INTRAMUSCULAR | Status: AC
  Administered 2023-04-06: 4 mg via INTRAVENOUS
  Filled 2023-04-06: qty 2

## 2023-04-06 MED ORDER — AMLODIPINE BESYLATE 5 MG PO TABS
10.0000 mg | ORAL_TABLET | Freq: Once | ORAL | Status: AC
  Administered 2023-04-06: 10 mg via ORAL
  Filled 2023-04-06: qty 2

## 2023-04-06 MED ORDER — SODIUM CHLORIDE 0.9 % IV BOLUS
1000.0000 mL | Freq: Once | INTRAVENOUS | Status: AC
  Administered 2023-04-06: 1000 mL via INTRAVENOUS

## 2023-04-06 MED ORDER — LOSARTAN POTASSIUM 25 MG PO TABS
100.0000 mg | ORAL_TABLET | Freq: Once | ORAL | Status: AC
  Administered 2023-04-06: 100 mg via ORAL
  Filled 2023-04-06: qty 4

## 2023-04-06 NOTE — ED Notes (Signed)
Patient said she has not taken any of her morning meds today. Pt also complains of dry eyes and not having her eye drops.

## 2023-04-06 NOTE — Discharge Instructions (Signed)
1)Avoid excessive amount of plain water, okay to drink your electrolytes water/drinks as previously advised by Dr. Gerda Diss, [name], MD  2)Repeat BMP blood test around 04/09/2023 3) continue to use Imodium as needed for diarrhea

## 2023-04-06 NOTE — Consult Note (Addendum)
Patient Demographics:    [name], is a 89 y.o. female  MRN: [id number]   DOB - [birth year]  Admit Date - 04/06/2023  Outpatient Primary MD for the patient is Babs Sciara, MD   Assessment & Plan:   Assessment and Plan: 1)HypoNatremia-patient with history of chronic hyponatremia -No neuro concerns --Denies emesis, but admits to poor oral intake due to nausea -Sodium is 126 in the setting of diarrhea x 4 on 04/05/2023 -Her sodium was 128 on 03/10/2023, sodium was 131 on 04/04/2023 -Patient received IV fluids in the ED -She is tolerating oral intake well -No further BMs since taking Imodium on 04/05/2023 -Patient advised to avoid dehydration, okay to drink electrolyte drinks -Follow-up with PCP for repeat BMP around 04/09/2023  2) Hypertension---Elevated BP noted in the ED but patient had not taken any of her oral medications today -BP meds restarted including amlodipine and losartan -Follow-up PCP on 04/09/2023 for possible BP meds adjustment  3)Dry cough and possible COVID exposure---- concerns about nausea, myalgias, dry cough and query chills. = Patient stated that multiple residents at the ALF Facility with COVID Chest x-ray with cardiomegaly without frank CHF,, some irregularity along the left heart border but no definite pneumonia or consolidation --COVID is negative -CBC WNL with a white count of 9.6 -=-No hypoxia -No fevers  4)DM2-recent A1c 6.4 reflecting good diabetic control PTA -Okay to restart metformin  5)GERD--continue PPI  6)history of chronic intermittent diarrhea ---she uses Imodium as needed - -Patient had 4 BMs yesterday--this is not unusual for her as she has intermittent diarrhea, she took Imodium  Disposition-discharge back to [facility]  -Discharge instructions -1)Avoid  excessive amount of plain water, okay to drink your electrolytes water/drinks as previously advised by Dr. Gerda Diss, [name], MD  2)Repeat BMP blood test around 04/09/2023 3) continue to use Imodium as needed for diarrhea   Allergies as of 04/06/2023       Reactions   Hydrocortisone Itching   Lisinopril    Other reaction(s): Lethargy (intolerance)   Phenergan [promethazine Hcl] Other (See Comments)   hallucinations   Statins Other (See Comments)   Severe myalgias   Trazodone And Nefazodone Cough   Prednisone Rash        Medication List     TAKE these medications    ALPRAZolam 0.25 MG tablet Commonly known as: XANAX Take 1 tablet (0.25 mg total) by mouth at bedtime as needed for sleep.   amLODipine 10 MG tablet Commonly known as: NORVASC Take 1 tablet (10 mg total) by mouth daily.   Anti-Diarrheal 2 MG tablet Generic drug: loperamide TAKE 1 TABLET BY MOUTH AS NEEDED FOR DIARRHEA OR LOOSE STOOL**MAX 4 TABLETS IN 24 HOURS** What changed: See the new instructions.   benzonatate 100 MG capsule Commonly known as: TESSALON TAKE (1) CAPSULE BY MOUTH 3 TIMES DAILY AS NEEDED FOR COUGH.   Beta Carotene Provitamin A 40981 units capsule Generic drug: beta carotene Take  25,000 Units by mouth daily.   bisacodyl 5 MG EC tablet Generic drug: bisacodyl TAKE 1 TABLET BY MOUTH ONCE DAILY AS NEEDED FOR MODERATE CONSTIPATION. What changed: See the new instructions.   Combivent Respimat 20-100 MCG/ACT Aers respimat Generic drug: Ipratropium-Albuterol INHALE (1) PUFF INTO THE LUNGS EVERY SIX HOURS AS NEEDED FOR WHEEZING OR SHORTNESS OF BREATH. What changed: See the new instructions.   cyanocobalamin 1000 MCG tablet Take 1,000 mcg by mouth at bedtime.   dicyclomine 10 MG capsule Commonly known as: BENTYL TAKE 1 CAPSULE BY MOUTH TWICE DAILY AS NEEDED FOR ABDOMINAL CRAMPS AND LOOSE STOOL. What changed:  how much to take how to take this when to take this reasons to take this    EasyMax Test test strip Generic drug: glucose blood CHECK BLOOD SUGAR ONCE DAILY.(CALL MD IF BS BELOW 60: OR IF BS ABOVE 400)   Fish Oil 1000 MG Caps One capsule BID What changed:  how much to take how to take this when to take this additional instructions   gabapentin 100 MG capsule Commonly known as: NEURONTIN 2 po bid What changed:  how much to take how to take this when to take this additional instructions   GoodSense Hemorrhoidal 0.25-14-74.9 % rectal ointment Generic drug: phenylephrine-shark liver oil-mineral oil-petrolatum Place 1 Application rectally 3 (three) times daily as needed for irritation.   Iron (Ferrous Sulfate) 325 (65 Fe) MG Tabs Take 325 mg by mouth every other day.   loratadine 10 MG tablet Commonly known as: CLARITIN Take 10 mg by mouth daily.   losartan 100 MG tablet Commonly known as: COZAAR Take 1 tablet daily What changed:  how much to take how to take this when to take this additional instructions   meclizine 25 MG tablet Commonly known as: ANTIVERT Take 1 tablet (25 mg total) by mouth 3 (three) times daily as needed for dizziness or nausea.   melatonin 3 MG Tabs tablet Take 3 mg by mouth at bedtime.   metFORMIN 500 MG tablet Commonly known as: GLUCOPHAGE Take one tablet 500mg  po in the morning and 1/2 tablet 250 mg po at supper What changed:  how much to take how to take this when to take this   Pain Relief Extra Strength 500 MG tablet Generic drug: acetaminophen TAKE (2) TABLETS BY MOUTH EVERY SIX HOURS A NEEDED FOR PAIN. What changed: See the new instructions.   pantoprazole 40 MG tablet Commonly known as: PROTONIX Take 1 tablet (40 mg total) by mouth daily.   PreserVision AREDS 2 Caps Take 1 capsule by mouth in the morning and at bedtime. What changed: Another medication with the same name was changed. Make sure you understand how and when to take each.   beta carotene w/minerals tablet Take 1 tablet by mouth at  bedtime. What changed: when to take this   prochlorperazine 10 MG tablet Commonly known as: COMPAZINE Take 1 tablet (10 mg total) by mouth every 6 (six) hours as needed for nausea or vomiting.   Theratears 0.25 % Soln Generic drug: Carboxymethylcellulose Sodium Apply 1 drop to eye in the morning and at bedtime.   Vitamin D (Cholecalciferol) 10 MCG (400 UNIT) Tabs TAKE 1 TABLET BY MOUTH ONCE DAILY.       With History of - Reviewed by me  Past Medical History:  Diagnosis Date   Blood transfusion without reported diagnosis    Cognitive dysfunction 03/04/2019   Patient scores 21 out of 30 on [place] cognitive assessment September 2020  Diabetes mellitus without complication (HCC)    Diabetic peripheral neuropathy associated with type 2 diabetes mellitus (HCC) 02/25/2022   Diverticulitis    Frailty 03/04/2019   H/O bilateral breast reduction surgery    Hyperlipidemia    a. intolerant to statins.    Hypertension       Past Surgical History:  Procedure Laterality Date   ABDOMINAL HYSTERECTOMY  2003   APPENDECTOMY     age 28   BREAST REDUCTION SURGERY     age 60   COLON SURGERY Left 2011   Hemicolectomy due to diverticulitis   EYE SURGERY  03/30/2009   cataract   KNEE SURGERY Right 2003   knee cap   LAPAROSCOPIC INCISIONAL / UMBILICAL / VENTRAL HERNIA REPAIR  02/23/2007   REDUCTION MAMMAPLASTY Bilateral 2001   REFRACTIVE SURGERY     TOTAL KNEE ARTHROPLASTY Right 04/10/2022   Procedure: TOTAL KNEE ARTHROPLASTY;  Surgeon: Eugenia Mcalpine, MD;  Location: [facility];  Service: Orthopedics;  Laterality: Right;  adductor canal   Chief Complaint  Patient presents with   Cough      HPI:    [name]  is a 89 y.o. female with Past medical history significant of hyperlipidemia, Hypertension, type 2 diabetes, diabetic peripheral neuropathy, diverticulosis with prior history of diverticulitis,, history of chronic intermittent diarrhea uses Imodium as needed , history of  cholelithiasis and history of recurrent UTIs [facility] [place] [facility] with concerns about nausea, myalgias, dry cough and query chills. = Patient stated that multiple residents at the ALF Facility with COVID -Patient had 4 BMs yesterday--this is not unusual for her as she has intermittent diarrhea, she took Imodium -Denies emesis, but admits to poor oral intake due to nausea -Additional history obtained from patient's daughter-in-law Ms [name] -Patient's son [name] is also here   -No documented fevers -No dysuria or flank pain, no abdominal pain -No chest pains palpitations or dyspnea -In the ED UA today is not suggestive of UTI -Chest x-ray with cardiomegaly without frank CHF,, some irregularity along the left heart border but no definite pneumonia or consolidation -Sodium is 126 in the setting of diarrhea x 4 on 04/05/2023 -Her sodium was 128 on 03/10/2023, sodium was 131 on 04/04/2023 -COVID is negative -CBC WNL with a white count of 9.6 -Elevated BP noted in the ED but patient had not taken any of her oral medications today =-No hypoxia    Review of systems:    In addition to the HPI above,   A full Review of  Systems was done, all other systems reviewed are negative except as noted above in HPI , .    Social History:  Reviewed by me    Social History   Tobacco Use   Smoking status: Never   Smokeless tobacco: Never  Substance Use Topics   Alcohol use: No       Family History :  Reviewed by me    Family History  Problem Relation Age of Onset   Cancer Mother        ovaian   Hypertension Father        kidney   Stroke Father    Cancer Father    Hypertension Sister    Diabetes Brother     Home Medications:   Prior to Admission medications   Medication Sig Start Date End Date Taking? Authorizing Provider  prochlorperazine (COMPAZINE) 10 MG tablet Take 1 tablet (10 mg total) by mouth  every 6 (six) hours as needed for  nausea or vomiting. 04/06/23  Yes Shon Hale, MD  acetaminophen (PAIN RELIEF EXTRA STRENGTH) 500 MG tablet TAKE (2) TABLETS BY MOUTH EVERY SIX HOURS A NEEDED FOR PAIN. Patient taking differently: Take 1,000 mg by mouth every 6 (six) hours as needed for moderate pain. 01/15/23   Babs Sciara, MD  ALPRAZolam Prudy Feeler) 0.25 MG tablet Take 1 tablet (0.25 mg total) by mouth at bedtime as needed for sleep. 03/26/23   Babs Sciara, MD  amLODipine (NORVASC) 10 MG tablet Take 1 tablet (10 mg total) by mouth daily. 12/02/22   Lilith Solana, MD  benzonatate (TESSALON) 100 MG capsule TAKE (1) CAPSULE BY MOUTH 3 TIMES DAILY AS NEEDED FOR COUGH. 11/21/22   Babs Sciara, MD  BETA CAROTENE PROVITAMIN A 47829 units capsule Take 25,000 Units by mouth daily. 01/01/23   [provider]  beta carotene w/minerals (OCUVITE) tablet Take 1 tablet by mouth at bedtime. Patient taking differently: Take 1 tablet by mouth daily. 07/03/22   Babs Sciara, MD  BISACODYL 5 MG EC tablet TAKE 1 TABLET BY MOUTH ONCE DAILY AS NEEDED FOR MODERATE CONSTIPATION. Patient taking differently: Take 5 mg by mouth daily as needed for moderate constipation. 12/20/22   Babs Sciara, MD  Carboxymethylcellulose Sodium (THERATEARS) 0.25 % SOLN Apply 1 drop to eye in the morning and at bedtime.    [provider]  COMBIVENT RESPIMAT 20-100 MCG/ACT AERS respimat INHALE (1) PUFF INTO THE LUNGS EVERY SIX HOURS AS NEEDED FOR WHEEZING OR SHORTNESS OF BREATH. Patient taking differently: Inhale 1 puff into the lungs every 6 (six) hours as needed for wheezing or shortness of breath. 11/21/22   Babs Sciara, MD  cyanocobalamin 1000 MCG tablet Take 1,000 mcg by mouth at bedtime.    [provider]  dicyclomine (BENTYL) 10 MG capsule TAKE 1 CAPSULE BY MOUTH TWICE DAILY AS NEEDED FOR ABDOMINAL CRAMPS AND LOOSE STOOL. Patient taking differently: Take 10 mg by mouth 2 (two) times daily as needed (abdominal cramps and loose  stools.). TAKE 1 CAPSULE BY MOUTH TWICE DAILY AS NEEDED FOR ABDOMINAL CRAMPS AND LOOSE STOOL. 11/21/22   Babs Sciara, MD  EASYMAX TEST test strip CHECK BLOOD SUGAR ONCE DAILY.(CALL MD IF BS BELOW 60: OR IF BS ABOVE 400) 02/03/23   Luking, [name], MD  gabapentin (NEURONTIN) 100 MG capsule 2 po bid Patient taking differently: Take 200 mg by mouth 2 (two) times daily. 11/19/22   Babs Sciara, MD  GOODSENSE HEMORRHOIDAL 0.25-14-74.9 % rectal ointment Place 1 Application rectally 3 (three) times daily as needed for irritation. 12/26/21   [provider]  Iron, Ferrous Sulfate, 325 (65 Fe) MG TABS Take 325 mg by mouth every other day. 02/20/23   Babs Sciara, MD  loperamide (ANTI-DIARRHEAL) 2 MG tablet TAKE 1 TABLET BY MOUTH AS NEEDED FOR DIARRHEA OR LOOSE STOOL**MAX 4 TABLETS IN 24 HOURS** Patient taking differently: Take 2 mg by mouth 4 (four) times daily as needed for diarrhea or loose stools. 12/27/22   Babs Sciara, MD  loratadine (CLARITIN) 10 MG tablet Take 10 mg by mouth daily.    [provider]  losartan (COZAAR) 100 MG tablet Take 1 tablet daily Patient taking differently: Take 100 mg by mouth daily. 12/02/22   Shon Hale, MD  meclizine (ANTIVERT) 25 MG tablet Take 1 tablet (25 mg total) by mouth 3 (three) times daily as needed for dizziness or nausea.  01/24/23   Babs Sciara, MD  melatonin 3 MG TABS tablet Take 3 mg by mouth at bedtime.    [provider]  metFORMIN (GLUCOPHAGE) 500 MG tablet Take one tablet 500mg  po in the morning and 1/2 tablet 250 mg po at supper Patient taking differently: Take 750 mg by mouth daily. Take one tablet 500mg  po in the morning and 1/2 tablet 250 mg po at supper 12/02/22   Shon Hale, MD  Multiple Vitamins-Minerals (PRESERVISION AREDS 2) CAPS Take 1 capsule by mouth in the morning and at bedtime.    [provider]  Omega-3 Fatty Acids (FISH OIL) 1000 MG CAPS One capsule BID Patient taking differently: Take 1  capsule by mouth in the morning and at bedtime. 08/07/21   Babs Sciara, MD  pantoprazole (PROTONIX) 40 MG tablet Take 1 tablet (40 mg total) by mouth daily. 02/11/23   Babs Sciara, MD  Vitamin D, Cholecalciferol, 10 MCG (400 UNIT) TABS TAKE 1 TABLET BY MOUTH ONCE DAILY. 05/12/20   Babs Sciara, MD     Allergies:     Allergies  Allergen Reactions   Hydrocortisone Itching   Lisinopril     Other reaction(s): Lethargy (intolerance)   Phenergan [Promethazine Hcl] Other (See Comments)    hallucinations   Statins Other (See Comments)    Severe myalgias   Trazodone And Nefazodone Cough   Prednisone Rash     Physical Exam:   Vitals  Blood pressure (!) 200/75, pulse 94, temperature 98.5 F (36.9 C), temperature source Oral, resp. rate 19, height 5\' 8"  (1.727 m), weight 74.4 kg, SpO2 94%.  Physical Examination: General appearance - alert,  in no distress , no conversational dyspnea Mental status - alert, oriented to person, place, and time,  Eyes - sclera anicteric Neck - supple, no JVD elevation , Chest - clear  to auscultation bilaterally, symmetrical air movement,  Heart - S1 and S2 normal, regular  Abdomen - soft, nontender, nondistended, +BS, no CVA tenderness Neurological - screening mental status exam normal, neck supple without rigidity, cranial nerves II through XII intact, DTR's normal and symmetric Extremities - no pedal edema noted, intact peripheral pulses  Skin - warm, dry   Data Review:    CBC Recent Labs  Lab 04/06/23 0940  WBC 9.6  HGB 12.4  HCT 36.6  PLT 249  MCV 88.0  MCH 29.8  MCHC 33.9  RDW 13.6  LYMPHSABS 0.9  MONOABS 0.7  EOSABS 0.1  BASOSABS 0.0   ------------------------------------------------------------------------------------------------------------------  Chemistries  Recent Labs  Lab 04/04/23 0850 04/06/23 0940  NA 131* 126*  K 5.0 4.1  CL 90* 89*  CO2 25 29  GLUCOSE 132* 145*  BUN 17 14  CREATININE 0.76 0.71  CALCIUM  9.8 8.8*   ------------------------------------------------------------------------------------------------------------------ estimated creatinine clearance is 48.1 mL/min (by C-G formula based on SCr of 0.71 mg/dL). ------------------------------------------------------------------------------------------------------------------ ------------------------------------------------------------------------------------------------------------------    Component Value Date/Time   BNP 96.0 03/16/2023 1206   Urinalysis    Component Value Date/Time   COLORURINE YELLOW 04/06/2023 1100   APPEARANCEUR CLEAR 04/06/2023 1100   APPEARANCEUR Clear 04/04/2023 0850   LABSPEC 1.012 04/06/2023 1100   PHURINE 6.0 04/06/2023 1100   GLUCOSEU NEGATIVE 04/06/2023 1100   HGBUR NEGATIVE 04/06/2023 1100   BILIRUBINUR NEGATIVE 04/06/2023 1100   BILIRUBINUR Negative 04/04/2023 0850   KETONESUR NEGATIVE 04/06/2023 1100   PROTEINUR 100 (A) 04/06/2023 1100   UROBILINOGEN 0.2 12/15/2014 1956   NITRITE NEGATIVE 04/06/2023 1100   LEUKOCYTESUR  NEGATIVE 04/06/2023 1100    Imaging Results:    DG Chest Portable 1 View  Result Date: 04/06/2023 CLINICAL DATA:  Cough.  COVID positive. EXAM: PORTABLE CHEST 1 VIEW COMPARISON:  03/16/2023 FINDINGS: AP portable radiograph with mild apical lordotic positioning. Numerous leads and wires project over the chest. Midline trachea. Mild cardiomegaly. Atherosclerosis in the transverse aorta. No pleural effusion or pneumothorax. Medial right lung base opacity is similar and likely due to scarring. There may be increased density along the left heart border and lateral left hemidiaphragm. No congestive failure. IMPRESSION: Possible increased density along the left heart border and lateral left hemidiaphragm. Cannot exclude left base airspace disease. Consider further evaluation with PA and lateral radiographs. Cardiomegaly without congestive failure. Aortic Atherosclerosis (ICD10-I70.0).  Electronically Signed   By: Jeronimo Greaves M.D.   On: 04/06/2023 10:01    Radiological Exams on Admission: DG Chest Portable 1 View  Result Date: 04/06/2023 CLINICAL DATA:  Cough.  COVID positive. EXAM: PORTABLE CHEST 1 VIEW COMPARISON:  03/16/2023 FINDINGS: AP portable radiograph with mild apical lordotic positioning. Numerous leads and wires project over the chest. Midline trachea. Mild cardiomegaly. Atherosclerosis in the transverse aorta. No pleural effusion or pneumothorax. Medial right lung base opacity is similar and likely due to scarring. There may be increased density along the left heart border and lateral left hemidiaphragm. No congestive failure. IMPRESSION: Possible increased density along the left heart border and lateral left hemidiaphragm. Cannot exclude left base airspace disease. Consider further evaluation with PA and lateral radiographs. Cardiomegaly without congestive failure. Aortic Atherosclerosis (ICD10-I70.0). Electronically Signed   By: Jeronimo Greaves M.D.   On: 04/06/2023 10:01     Family Communication:   discussed with the patient and daughter in law [name]  (son [name] is also present) who indicate understanding and agree with the plan   Condition -stable  Shon Hale M.D on 04/06/2023 at 4:26 PM Go to [email or web address] -  for contact info  [facility] - Office  [phone]

## 2023-04-06 NOTE — ED Provider Notes (Signed)
[place] EMERGENCY DEPARTMENT AT [facility] Provider Note   CSN: [id number] Arrival date & time: 04/06/23  5621     History  Chief Complaint  Patient presents with   Cough    [name] is a 89 y.o. female.   Cough Associated symptoms: chills, myalgias and sore throat   Associated symptoms: no chest pain, no ear pain, no fever, no headaches, no rash and no shortness of breath        [name] is a 89 y.o. female past medical history includes hypertension, type 2 diabetes, generalized anxiety disorder, and cognitive dysfunction brought in by EMS from high [facility] for evaluation of cough, diarrhea, and generalized bodyaches.  Symptoms began 2 to 3 days ago.  Patient endorses more than 2 episodes of diarrhea per day.  Took Imodium yesterday.  History of on going dirreha for weeks.  No active diarrhea today.  Has nausea this morning and has not taken her morning medications.  Having chills, generalized bodyaches, sore throat and mostly nonproductive cough.  Per EMS, nursing facility has several patients with COVID.  Patient denies any known fever, vomiting, chest pain, abdominal pain or shortness of breath.  No black or bloody stools.   Home Medications Prior to Admission medications   Medication Sig Start Date End Date Taking? Authorizing Provider  acetaminophen (PAIN RELIEF EXTRA STRENGTH) 500 MG tablet TAKE (2) TABLETS BY MOUTH EVERY SIX HOURS A NEEDED FOR PAIN. Patient taking differently: Take 1,000 mg by mouth every 6 (six) hours as needed for moderate pain. 01/15/23   Babs Sciara, MD  ALPRAZolam Prudy Feeler) 0.25 MG tablet Take 1 tablet (0.25 mg total) by mouth at bedtime as needed for sleep. 03/26/23   Babs Sciara, MD  amLODipine (NORVASC) 10 MG tablet Take 1 tablet (10 mg total) by mouth daily. 12/02/22   Emokpae, Courage, MD  benzonatate (TESSALON) 100 MG capsule TAKE (1) CAPSULE BY MOUTH 3 TIMES DAILY AS NEEDED FOR COUGH. 11/21/22   Babs Sciara, MD   BETA CAROTENE PROVITAMIN A 30865 units capsule Take 25,000 Units by mouth daily. 01/01/23   [provider]  beta carotene w/minerals (OCUVITE) tablet Take 1 tablet by mouth at bedtime. Patient taking differently: Take 1 tablet by mouth daily. 07/03/22   Babs Sciara, MD  BISACODYL 5 MG EC tablet TAKE 1 TABLET BY MOUTH ONCE DAILY AS NEEDED FOR MODERATE CONSTIPATION. Patient taking differently: Take 5 mg by mouth daily as needed for moderate constipation. 12/20/22   Babs Sciara, MD  Carboxymethylcellulose Sodium (THERATEARS) 0.25 % SOLN Apply 1 drop to eye in the morning and at bedtime.    [provider]  COMBIVENT RESPIMAT 20-100 MCG/ACT AERS respimat INHALE (1) PUFF INTO THE LUNGS EVERY SIX HOURS AS NEEDED FOR WHEEZING OR SHORTNESS OF BREATH. Patient taking differently: Inhale 1 puff into the lungs every 6 (six) hours as needed for wheezing or shortness of breath. 11/21/22   Babs Sciara, MD  cyanocobalamin 1000 MCG tablet Take 1,000 mcg by mouth at bedtime.    [provider]  dicyclomine (BENTYL) 10 MG capsule TAKE 1 CAPSULE BY MOUTH TWICE DAILY AS NEEDED FOR ABDOMINAL CRAMPS AND LOOSE STOOL. Patient taking differently: Take 10 mg by mouth 2 (two) times daily as needed (abdominal cramps and loose stools.). TAKE 1 CAPSULE BY MOUTH TWICE DAILY AS NEEDED FOR ABDOMINAL CRAMPS AND LOOSE STOOL. 11/21/22   Babs Sciara, MD  EASYMAX TEST test strip  CHECK BLOOD SUGAR ONCE DAILY.(CALL MD IF BS BELOW 60: OR IF BS ABOVE 400) 02/03/23   Luking, [name], MD  gabapentin (NEURONTIN) 100 MG capsule 2 po bid Patient taking differently: Take 200 mg by mouth 2 (two) times daily. 11/19/22   Babs Sciara, MD  GOODSENSE HEMORRHOIDAL 0.25-14-74.9 % rectal ointment Place 1 Application rectally 3 (three) times daily as needed for irritation. 12/26/21   [provider]  Iron, Ferrous Sulfate, 325 (65 Fe) MG TABS Take 325 mg by mouth every other day. 02/20/23   Babs Sciara, MD   loperamide (ANTI-DIARRHEAL) 2 MG tablet TAKE 1 TABLET BY MOUTH AS NEEDED FOR DIARRHEA OR LOOSE STOOL**MAX 4 TABLETS IN 24 HOURS** Patient taking differently: Take 2 mg by mouth 4 (four) times daily as needed for diarrhea or loose stools. 12/27/22   Babs Sciara, MD  loratadine (CLARITIN) 10 MG tablet Take 10 mg by mouth daily.    [provider]  losartan (COZAAR) 100 MG tablet Take 1 tablet daily Patient taking differently: Take 100 mg by mouth daily. 12/02/22   Shon Hale, MD  meclizine (ANTIVERT) 25 MG tablet Take 1 tablet (25 mg total) by mouth 3 (three) times daily as needed for dizziness or nausea. 01/24/23   Babs Sciara, MD  melatonin 3 MG TABS tablet Take 3 mg by mouth at bedtime.    [provider]  metFORMIN (GLUCOPHAGE) 500 MG tablet Take one tablet 500mg  po in the morning and 1/2 tablet 250 mg po at supper Patient taking differently: Take 750 mg by mouth daily. Take one tablet 500mg  po in the morning and 1/2 tablet 250 mg po at supper 12/02/22   Shon Hale, MD  Multiple Vitamins-Minerals (PRESERVISION AREDS 2) CAPS Take 1 capsule by mouth in the morning and at bedtime.    [provider]  Omega-3 Fatty Acids (FISH OIL) 1000 MG CAPS One capsule BID Patient taking differently: Take 1 capsule by mouth in the morning and at bedtime. 08/07/21   Babs Sciara, MD  pantoprazole (PROTONIX) 40 MG tablet Take 1 tablet (40 mg total) by mouth daily. 02/11/23   Babs Sciara, MD  Vitamin D, Cholecalciferol, 10 MCG (400 UNIT) TABS TAKE 1 TABLET BY MOUTH ONCE DAILY. 05/12/20   Babs Sciara, MD      Allergies    Hydrocortisone, Lisinopril, Phenergan [promethazine hcl], Statins, Trazodone and nefazodone, and Prednisone    Review of Systems   Review of Systems  Constitutional:  Positive for appetite change and chills. Negative for fever.  HENT:  Positive for congestion and sore throat. Negative for ear pain and trouble swallowing.   Respiratory:   Positive for cough. Negative for shortness of breath.   Cardiovascular:  Negative for chest pain.  Gastrointestinal:  Positive for diarrhea and nausea. Negative for abdominal distention, abdominal pain and vomiting.  Genitourinary:  Negative for difficulty urinating, dysuria and flank pain.  Musculoskeletal:  Positive for myalgias. Negative for neck pain and neck stiffness.  Skin:  Negative for rash.  Neurological:  Negative for dizziness and headaches.    Physical Exam Updated Vital Signs BP (!) 180/69   Pulse 78   Temp 98 F (36.7 C) (Oral)   Resp 18   Ht 5\' 8"  (1.727 m)   Wt 74.4 kg   SpO2 95%   BMI 24.94 kg/m  Physical Exam Vitals and nursing note reviewed.  Constitutional:      General: She is not in acute  distress.    Appearance: Normal appearance. She is not toxic-appearing.  HENT:     Nose: Nose normal.     Mouth/Throat:     Mouth: Mucous membranes are moist.     Pharynx: No oropharyngeal exudate or posterior oropharyngeal erythema.  Eyes:     Conjunctiva/sclera: Conjunctivae normal.     Pupils: Pupils are equal, round, and reactive to light.  Cardiovascular:     Rate and Rhythm: Normal rate and regular rhythm.     Pulses: Normal pulses.  Pulmonary:     Effort: Pulmonary effort is normal.  Abdominal:     Palpations: Abdomen is soft.     Tenderness: There is no abdominal tenderness.  Musculoskeletal:        General: Normal range of motion.     Cervical back: Normal range of motion. No rigidity.     Right lower leg: No edema.     Left lower leg: No edema.  Lymphadenopathy:     Cervical: No cervical adenopathy.  Skin:    General: Skin is warm.     Capillary Refill: Capillary refill takes less than 2 seconds.     Findings: No rash.  Neurological:     General: No focal deficit present.     Mental Status: She is alert.     Sensory: No sensory deficit.     Motor: No weakness.     ED Results / Procedures / Treatments   Labs (all labs ordered are listed,  but only abnormal results are displayed) Labs Reviewed  CBC WITH DIFFERENTIAL/PLATELET - Abnormal; Notable for the following components:      Result Value   Neutro Abs 7.9 (*)    All other components within normal limits  BASIC METABOLIC PANEL - Abnormal; Notable for the following components:   Sodium 126 (*)    Chloride 89 (*)    Glucose, Bld 145 (*)    Calcium 8.8 (*)    All other components within normal limits  SARS CORONAVIRUS 2 BY RT PCR  URINALYSIS, ROUTINE W REFLEX MICROSCOPIC    EKG EKG Interpretation Date/Time:  Sunday April 06 2023 09:00:10 EDT Ventricular Rate:  80 PR Interval:  188 QRS Duration:  86 QT Interval:  379 QTC Calculation: 438 R Axis:   53  Text Interpretation: Sinus rhythm Atrial premature complex Anterior infarct, old Confirmed by Estanislado Pandy [phone]) on 04/06/2023 11:03:41 AM  Radiology DG Chest Portable 1 View  Result Date: 04/06/2023 CLINICAL DATA:  Cough.  COVID positive. EXAM: PORTABLE CHEST 1 VIEW COMPARISON:  03/16/2023 FINDINGS: AP portable radiograph with mild apical lordotic positioning. Numerous leads and wires project over the chest. Midline trachea. Mild cardiomegaly. Atherosclerosis in the transverse aorta. No pleural effusion or pneumothorax. Medial right lung base opacity is similar and likely due to scarring. There may be increased density along the left heart border and lateral left hemidiaphragm. No congestive failure. IMPRESSION: Possible increased density along the left heart border and lateral left hemidiaphragm. Cannot exclude left base airspace disease. Consider further evaluation with PA and lateral radiographs. Cardiomegaly without congestive failure. Aortic Atherosclerosis (ICD10-I70.0). Electronically Signed   By: Jeronimo Greaves M.D.   On: 04/06/2023 10:01    Procedures Procedures    Medications Ordered in ED Medications - No data to display  ED Course/ Medical Decision Making/ A&P  Medical Decision Making Patient here from high [facility] for evaluation of generalized weakness, body aches, and cough symptoms present for 2 to 3 days.  Cough mostly nonproductive.  She also endorses having frequent episodes of watery stools.  Nonbloody or black.  Does not take blood thinners.  Denies any abdominal pain chest pain or shortness of breath.  Differential would include but not limited to COVID, PE, ACS, pneumonia, viral process.  Per EMS facility has multiple patients that are COVID-positive.  Clinically, suspicion is high for COVID  Patient was seen by PCP last week had labs was mildly hyponatremic at that time.  Will repeat labs today along with chest x-ray and COVID test  Amount and/or Complexity of Data Reviewed Labs: ordered.    Details: Labs interpreted by me, no evidence of leukocytosis, hemoglobin unremarkable.  Chemistries show hyponatremia at 126, kidney functions unremarkable.  COVID test is negative. Radiology: ordered.    Details: Chest x-ray shows increased density along the left heart border and left lateral hemidiaphragm cannot exclude left airspace disease. Discussion of management or test interpretation with external provider(s): 1 view chest shows a density along the left heart border, I have low clinical suspicion that this is representative of a pneumonia as patient is afebrile with no leukocytosis.  She is hyponatremic here today.  Has history of same.  Diarrhea persistent for weeks.  She had sodium of 131 on Friday it is 126 today.  IV fluids administered.  I feel that she will need hospital admission for her hyponatremia.  Discussed findings with [facility] hospitalist who will see patient in the ED.  Felt that patient's diarrhea is chronic and she has chronic hyponatremia as sodium was 133 one month ago and averaging in low 130's   Patient was seen by hospitalist, felt that patient stable for d/c back to facility.  Has tolerated IVF's here and snack.  Given  antiemetic and her routine antihypertensive medications.    I spoke with patient's son who is concerned about her ongoing diarrhea and low sodium.  Cause of her chronic diarrhea is unclear, but not felt to be from infectious source.  Patient may benefit from outpatient GI consult, will provide f/u info and she will f/u with PCP as well.      Risk Prescription drug management.           Final Clinical Impression(s) / ED Diagnoses Final diagnoses:  Hyponatremia  Generalized weakness    Rx / DC Orders ED Discharge Orders     None         Pauline Aus, PA-C 04/06/23 1647    Coral Spikes, DO 04/08/23 [phone]

## 2023-04-06 NOTE — ED Notes (Signed)
Angel from [facility] called to inquire about her status.

## 2023-04-06 NOTE — ED Triage Notes (Signed)
Pt arrived [facility] from [place] with c/o chills, body aches , and cough. Her facility has coivd in the building. Pt is on RA.

## 2023-04-07 ENCOUNTER — Other Ambulatory Visit: Payer: Self-pay

## 2023-04-07 ENCOUNTER — Encounter (HOSPITAL_COMMUNITY): Payer: Self-pay

## 2023-04-07 ENCOUNTER — Telehealth: Payer: Self-pay

## 2023-04-07 ENCOUNTER — Emergency Department (HOSPITAL_COMMUNITY): Payer: Medicare Other

## 2023-04-07 ENCOUNTER — Inpatient Hospital Stay (HOSPITAL_COMMUNITY)
Admission: EM | Admit: 2023-04-07 | Discharge: 2023-04-15 | DRG: 356 | Disposition: A | Discharge status: Skilled Nursing Facility | Payer: Medicare Other | Source: Skilled Nursing Facility | Attending: Family Medicine | Admitting: Family Medicine

## 2023-04-07 DIAGNOSIS — Z9049 Acquired absence of other specified parts of digestive tract: Secondary | ICD-10-CM

## 2023-04-07 DIAGNOSIS — R0602 Shortness of breath: Secondary | ICD-10-CM | POA: Diagnosis not present

## 2023-04-07 DIAGNOSIS — R109 Unspecified abdominal pain: Secondary | ICD-10-CM | POA: Diagnosis not present

## 2023-04-07 DIAGNOSIS — E871 Hypo-osmolality and hyponatremia: Secondary | ICD-10-CM | POA: Diagnosis present

## 2023-04-07 DIAGNOSIS — R262 Difficulty in walking, not elsewhere classified: Secondary | ICD-10-CM | POA: Diagnosis not present

## 2023-04-07 DIAGNOSIS — K2990 Gastroduodenitis, unspecified, without bleeding: Secondary | ICD-10-CM | POA: Diagnosis present

## 2023-04-07 DIAGNOSIS — R634 Abnormal weight loss: Secondary | ICD-10-CM | POA: Diagnosis present

## 2023-04-07 DIAGNOSIS — G8929 Other chronic pain: Secondary | ICD-10-CM | POA: Diagnosis present

## 2023-04-07 DIAGNOSIS — K551 Chronic vascular disorders of intestine: Secondary | ICD-10-CM | POA: Diagnosis present

## 2023-04-07 DIAGNOSIS — R935 Abnormal findings on diagnostic imaging of other abdominal regions, including retroperitoneum: Secondary | ICD-10-CM

## 2023-04-07 DIAGNOSIS — Z79899 Other long term (current) drug therapy: Secondary | ICD-10-CM

## 2023-04-07 DIAGNOSIS — J3089 Other allergic rhinitis: Secondary | ICD-10-CM | POA: Diagnosis not present

## 2023-04-07 DIAGNOSIS — I700 Atherosclerosis of aorta: Secondary | ICD-10-CM | POA: Diagnosis present

## 2023-04-07 DIAGNOSIS — I771 Stricture of artery: Secondary | ICD-10-CM | POA: Diagnosis present

## 2023-04-07 DIAGNOSIS — I10 Essential (primary) hypertension: Secondary | ICD-10-CM | POA: Diagnosis not present

## 2023-04-07 DIAGNOSIS — Z1152 Encounter for screening for COVID-19: Secondary | ICD-10-CM | POA: Diagnosis not present

## 2023-04-07 DIAGNOSIS — F419 Anxiety disorder, unspecified: Secondary | ICD-10-CM | POA: Diagnosis not present

## 2023-04-07 DIAGNOSIS — E785 Hyperlipidemia, unspecified: Secondary | ICD-10-CM | POA: Diagnosis present

## 2023-04-07 DIAGNOSIS — E1142 Type 2 diabetes mellitus with diabetic polyneuropathy: Secondary | ICD-10-CM | POA: Diagnosis present

## 2023-04-07 DIAGNOSIS — R627 Adult failure to thrive: Secondary | ICD-10-CM | POA: Diagnosis present

## 2023-04-07 DIAGNOSIS — M6281 Muscle weakness (generalized): Secondary | ICD-10-CM | POA: Diagnosis not present

## 2023-04-07 DIAGNOSIS — E1159 Type 2 diabetes mellitus with other circulatory complications: Secondary | ICD-10-CM | POA: Diagnosis not present

## 2023-04-07 DIAGNOSIS — K649 Unspecified hemorrhoids: Secondary | ICD-10-CM | POA: Diagnosis not present

## 2023-04-07 DIAGNOSIS — Z833 Family history of diabetes mellitus: Secondary | ICD-10-CM

## 2023-04-07 DIAGNOSIS — R1030 Lower abdominal pain, unspecified: Secondary | ICD-10-CM | POA: Diagnosis not present

## 2023-04-07 DIAGNOSIS — K449 Diaphragmatic hernia without obstruction or gangrene: Secondary | ICD-10-CM | POA: Diagnosis present

## 2023-04-07 DIAGNOSIS — J189 Pneumonia, unspecified organism: Secondary | ICD-10-CM | POA: Diagnosis present

## 2023-04-07 DIAGNOSIS — R0902 Hypoxemia: Secondary | ICD-10-CM | POA: Diagnosis not present

## 2023-04-07 DIAGNOSIS — K5900 Constipation, unspecified: Secondary | ICD-10-CM | POA: Diagnosis not present

## 2023-04-07 DIAGNOSIS — F32A Depression, unspecified: Secondary | ICD-10-CM | POA: Diagnosis present

## 2023-04-07 DIAGNOSIS — F0390 Unspecified dementia without behavioral disturbance: Secondary | ICD-10-CM | POA: Diagnosis not present

## 2023-04-07 DIAGNOSIS — R638 Other symptoms and signs concerning food and fluid intake: Secondary | ICD-10-CM | POA: Diagnosis not present

## 2023-04-07 DIAGNOSIS — Z933 Colostomy status: Secondary | ICD-10-CM

## 2023-04-07 DIAGNOSIS — I152 Hypertension secondary to endocrine disorders: Secondary | ICD-10-CM | POA: Diagnosis not present

## 2023-04-07 DIAGNOSIS — K2970 Gastritis, unspecified, without bleeding: Secondary | ICD-10-CM | POA: Diagnosis present

## 2023-04-07 DIAGNOSIS — K219 Gastro-esophageal reflux disease without esophagitis: Secondary | ICD-10-CM | POA: Diagnosis present

## 2023-04-07 DIAGNOSIS — I701 Atherosclerosis of renal artery: Secondary | ICD-10-CM | POA: Diagnosis not present

## 2023-04-07 DIAGNOSIS — I209 Angina pectoris, unspecified: Secondary | ICD-10-CM | POA: Diagnosis not present

## 2023-04-07 DIAGNOSIS — Z7189 Other specified counseling: Secondary | ICD-10-CM | POA: Diagnosis not present

## 2023-04-07 DIAGNOSIS — K8020 Calculus of gallbladder without cholecystitis without obstruction: Secondary | ICD-10-CM | POA: Diagnosis present

## 2023-04-07 DIAGNOSIS — Z96651 Presence of right artificial knee joint: Secondary | ICD-10-CM | POA: Diagnosis present

## 2023-04-07 DIAGNOSIS — N281 Cyst of kidney, acquired: Secondary | ICD-10-CM | POA: Diagnosis present

## 2023-04-07 DIAGNOSIS — K2950 Unspecified chronic gastritis without bleeding: Secondary | ICD-10-CM | POA: Diagnosis not present

## 2023-04-07 DIAGNOSIS — R1084 Generalized abdominal pain: Secondary | ICD-10-CM | POA: Diagnosis not present

## 2023-04-07 DIAGNOSIS — E43 Unspecified severe protein-calorie malnutrition: Secondary | ICD-10-CM | POA: Diagnosis not present

## 2023-04-07 DIAGNOSIS — Z20822 Contact with and (suspected) exposure to covid-19: Secondary | ICD-10-CM | POA: Diagnosis present

## 2023-04-07 DIAGNOSIS — I119 Hypertensive heart disease without heart failure: Secondary | ICD-10-CM | POA: Diagnosis present

## 2023-04-07 DIAGNOSIS — J9601 Acute respiratory failure with hypoxia: Secondary | ICD-10-CM | POA: Diagnosis present

## 2023-04-07 DIAGNOSIS — Z515 Encounter for palliative care: Secondary | ICD-10-CM | POA: Diagnosis not present

## 2023-04-07 DIAGNOSIS — R918 Other nonspecific abnormal finding of lung field: Secondary | ICD-10-CM | POA: Diagnosis not present

## 2023-04-07 DIAGNOSIS — K5792 Diverticulitis of intestine, part unspecified, without perforation or abscess without bleeding: Secondary | ICD-10-CM | POA: Diagnosis not present

## 2023-04-07 DIAGNOSIS — Z885 Allergy status to narcotic agent status: Secondary | ICD-10-CM

## 2023-04-07 DIAGNOSIS — Z7984 Long term (current) use of oral hypoglycemic drugs: Secondary | ICD-10-CM

## 2023-04-07 DIAGNOSIS — F5101 Primary insomnia: Secondary | ICD-10-CM | POA: Diagnosis not present

## 2023-04-07 DIAGNOSIS — E119 Type 2 diabetes mellitus without complications: Secondary | ICD-10-CM | POA: Diagnosis not present

## 2023-04-07 DIAGNOSIS — Q272 Other congenital malformations of renal artery: Secondary | ICD-10-CM | POA: Diagnosis not present

## 2023-04-07 DIAGNOSIS — Z66 Do not resuscitate: Secondary | ICD-10-CM | POA: Diagnosis present

## 2023-04-07 DIAGNOSIS — R110 Nausea: Secondary | ICD-10-CM | POA: Diagnosis not present

## 2023-04-07 DIAGNOSIS — R059 Cough, unspecified: Secondary | ICD-10-CM | POA: Diagnosis not present

## 2023-04-07 DIAGNOSIS — K222 Esophageal obstruction: Secondary | ICD-10-CM | POA: Diagnosis present

## 2023-04-07 DIAGNOSIS — F411 Generalized anxiety disorder: Secondary | ICD-10-CM | POA: Diagnosis present

## 2023-04-07 DIAGNOSIS — M159 Polyosteoarthritis, unspecified: Secondary | ICD-10-CM | POA: Diagnosis not present

## 2023-04-07 DIAGNOSIS — I774 Celiac artery compression syndrome: Secondary | ICD-10-CM | POA: Diagnosis not present

## 2023-04-07 DIAGNOSIS — R488 Other symbolic dysfunctions: Secondary | ICD-10-CM | POA: Diagnosis not present

## 2023-04-07 DIAGNOSIS — Z8249 Family history of ischemic heart disease and other diseases of the circulatory system: Secondary | ICD-10-CM

## 2023-04-07 DIAGNOSIS — R531 Weakness: Secondary | ICD-10-CM | POA: Diagnosis not present

## 2023-04-07 DIAGNOSIS — K55059 Acute (reversible) ischemia of intestine, part and extent unspecified: Secondary | ICD-10-CM | POA: Diagnosis not present

## 2023-04-07 DIAGNOSIS — J168 Pneumonia due to other specified infectious organisms: Secondary | ICD-10-CM | POA: Diagnosis not present

## 2023-04-07 DIAGNOSIS — Z741 Need for assistance with personal care: Secondary | ICD-10-CM | POA: Diagnosis not present

## 2023-04-07 DIAGNOSIS — Z433 Encounter for attention to colostomy: Secondary | ICD-10-CM | POA: Diagnosis not present

## 2023-04-07 DIAGNOSIS — Z90710 Acquired absence of both cervix and uterus: Secondary | ICD-10-CM

## 2023-04-07 DIAGNOSIS — Z87440 Personal history of urinary (tract) infections: Secondary | ICD-10-CM

## 2023-04-07 DIAGNOSIS — Z888 Allergy status to other drugs, medicaments and biological substances status: Secondary | ICD-10-CM

## 2023-04-07 DIAGNOSIS — R197 Diarrhea, unspecified: Secondary | ICD-10-CM | POA: Diagnosis not present

## 2023-04-07 LAB — SARS CORONAVIRUS 2 BY RT PCR: SARS Coronavirus 2 by RT PCR: NEGATIVE

## 2023-04-07 LAB — COMPREHENSIVE METABOLIC PANEL
ALT: 15 U/L (ref 0–44)
AST: 17 U/L (ref 15–41)
Albumin: 3.5 g/dL (ref 3.5–5.0)
Alkaline Phosphatase: 68 U/L (ref 38–126)
Anion gap: 8 (ref 5–15)
BUN: 16 mg/dL (ref 8–23)
CO2: 28 mmol/L (ref 22–32)
Calcium: 8.6 mg/dL — ABNORMAL LOW (ref 8.9–10.3)
Chloride: 90 mmol/L — ABNORMAL LOW (ref 98–111)
Creatinine, Ser: 0.71 mg/dL (ref 0.44–1.00)
GFR, Estimated: >60 mL/min (ref >60)
Glucose, Bld: 107 mg/dL — ABNORMAL HIGH (ref 70–99)
Potassium: 3.9 mmol/L (ref 3.5–5.1)
Sodium: 126 mmol/L — ABNORMAL LOW (ref 135–145)
Total Bilirubin: 0.6 mg/dL (ref 0.3–1.2)
Total Protein: 6.3 g/dL — ABNORMAL LOW (ref 6.5–8.1)

## 2023-04-07 LAB — CBC WITH DIFFERENTIAL/PLATELET
Abs Immature Granulocytes: 0.02 10*3/uL (ref 0.00–0.07)
Basophils Absolute: 0 10*3/uL (ref 0.0–0.1)
Basophils Relative: 0 %
Eosinophils Absolute: 0.1 10*3/uL (ref 0.0–0.5)
Eosinophils Relative: 2 %
HCT: 35.8 % — ABNORMAL LOW (ref 36.0–46.0)
Hemoglobin: 11.7 g/dL — ABNORMAL LOW (ref 12.0–15.0)
Immature Granulocytes: 0 %
Lymphocytes Relative: 23 %
Lymphs Abs: 1.7 10*3/uL (ref 0.7–4.0)
MCH: 29 pg (ref 26.0–34.0)
MCHC: 32.7 g/dL (ref 30.0–36.0)
MCV: 88.8 fL (ref 80.0–100.0)
Monocytes Absolute: 0.7 10*3/uL (ref 0.1–1.0)
Monocytes Relative: 10 %
Neutro Abs: 4.7 10*3/uL (ref 1.7–7.7)
Neutrophils Relative %: 65 %
Platelets: 245 10*3/uL (ref 150–400)
RBC: 4.03 MIL/uL (ref 3.87–5.11)
RDW: 13.8 % (ref 11.5–15.5)
WBC: 7.2 10*3/uL (ref 4.0–10.5)
nRBC: 0 % (ref 0.0–0.2)

## 2023-04-07 LAB — URINALYSIS, ROUTINE W REFLEX MICROSCOPIC
Bacteria, UA: NONE SEEN
Bilirubin Urine: NEGATIVE
Glucose, UA: 50 mg/dL — AB
Hgb urine dipstick: NEGATIVE
Ketones, ur: NEGATIVE mg/dL
Nitrite: NEGATIVE
Protein, ur: 100 mg/dL — AB
Specific Gravity, Urine: 1.015 (ref 1.005–1.030)
pH: 5 (ref 5.0–8.0)

## 2023-04-07 LAB — BRAIN NATRIURETIC PEPTIDE: B Natriuretic Peptide: 70 pg/mL (ref 0.0–100.0)

## 2023-04-07 MED ORDER — SODIUM CHLORIDE 0.9 % IV SOLN
500.0000 mg | Freq: Once | INTRAVENOUS | Status: AC
  Administered 2023-04-07: 500 mg via INTRAVENOUS
  Filled 2023-04-07: qty 5

## 2023-04-07 MED ORDER — SODIUM CHLORIDE 0.9 % IV SOLN
1.0000 g | Freq: Once | INTRAVENOUS | Status: AC
  Administered 2023-04-07: 1 g via INTRAVENOUS
  Filled 2023-04-07: qty 10

## 2023-04-07 MED ORDER — ONDANSETRON HCL 4 MG PO TABS: 4.0000 mg | ORAL_TABLET | Freq: Four times a day (QID) | ORAL | Status: DC | PRN

## 2023-04-07 MED ORDER — MELATONIN 3 MG PO TABS
3.0000 mg | ORAL_TABLET | Freq: Every evening | ORAL | Status: DC | PRN
  Administered 2023-04-08 – 2023-04-11 (×5): 3 mg via ORAL
  Filled 2023-04-07 (×5): qty 1

## 2023-04-07 MED ORDER — SODIUM CHLORIDE 0.9 % IV SOLN
2.0000 g | INTRAVENOUS | Status: DC
  Administered 2023-04-08 – 2023-04-13 (×6): 2 g via INTRAVENOUS
  Filled 2023-04-07 (×6): qty 20

## 2023-04-07 MED ORDER — ONDANSETRON HCL 4 MG/2ML IJ SOLN
4.0000 mg | Freq: Four times a day (QID) | INTRAMUSCULAR | Status: DC | PRN
  Administered 2023-04-10: 4 mg via INTRAVENOUS
  Filled 2023-04-07: qty 2

## 2023-04-07 MED ORDER — SODIUM CHLORIDE 0.9 % IV SOLN
500.0000 mg | INTRAVENOUS | Status: DC
  Administered 2023-04-08 – 2023-04-12 (×5): 500 mg via INTRAVENOUS
  Filled 2023-04-07 (×5): qty 5

## 2023-04-07 MED ORDER — SODIUM CHLORIDE 0.9 % IV BOLUS
1000.0000 mL | Freq: Once | INTRAVENOUS | Status: AC
  Administered 2023-04-07: 1000 mL via INTRAVENOUS

## 2023-04-07 NOTE — H&P (Addendum)
History and Physical    Patient: [name]:[id number] DOB: June 08, 1932 DOA: 04/07/2023 DOS: the patient was seen and examined on 04/07/2023 PCP: Babs Sciara, MD  Patient coming from: ALF/ILF  Chief Complaint:  Chief Complaint  Patient presents with   Shortness of Breath   HPI: [name] is a vibrant 89 y.o. 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 knee replacement in 2023 who was walking a mile a day at the end of last year.  Earlier this year she was down to half a mile a day.  In the last 3 months the patient has been plagued with intermittent diarrhea, poor appetite, and progressive weakness.  She also says that she just feels awful.  She denies any specific pain.  She cannot eat because it just makes her feel worse though she denies vomiting.  She has become very weak and says she has not been able to get out of bed for the last 3 days.  No pain.  No blood in her stool.  The diarrhea is mostly just watery.  Over the course of the last 3 months she has had multiple ER visits.  Usually hyponatremia is found.  Her stool was guaiac positive on 1 occasion. She has also been treated for multiple urinary tract infections.  Despite fluids for hyponatremia and multiple rounds of antibiotics she is not feeling better.  She also feels out of it like her brain is in a fog.  This is not her baseline and she is getting worse.  In the emergency department her O2 sats were in the 80s on room air.  She has been experiencing a dry cough for at least the last week or 2.  She was seen in the emergency department yesterday and checked for COVID which was negative.  There are multiple cases of COVID at her assisted living facility.  Reviewing her records it appears she had a CT scan of her abdomen and pelvis a month ago.  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 The patient will be  admitted for further workup of her feeling awful, progressive generalized weakness and no appetite.   Review of Systems: As mentioned in the history of present illness. All other systems reviewed and are negative. Past Medical History:  Diagnosis Date   Blood transfusion without reported diagnosis    Cognitive dysfunction 03/04/2019   Patient scores 21 out of 30 on [place] cognitive assessment September 2020   Diabetes mellitus without complication (HCC)    Diabetic peripheral neuropathy associated with type 2 diabetes mellitus (HCC) 02/25/2022   Diverticulitis    Frailty 03/04/2019   H/O bilateral breast reduction surgery    Hyperlipidemia    a. intolerant to statins.    Hypertension    Past Surgical History:  Procedure Laterality Date   ABDOMINAL HYSTERECTOMY  2003   APPENDECTOMY     age 62   BREAST REDUCTION SURGERY     age 31   COLON SURGERY Left 2011   Hemicolectomy due to diverticulitis   EYE SURGERY  03/30/2009   cataract   KNEE SURGERY Right 2003   knee cap   LAPAROSCOPIC INCISIONAL / UMBILICAL / VENTRAL HERNIA REPAIR  02/23/2007   REDUCTION MAMMAPLASTY Bilateral 2001   REFRACTIVE SURGERY     TOTAL KNEE ARTHROPLASTY Right 04/10/2022   Procedure: TOTAL KNEE ARTHROPLASTY;  Surgeon: Eugenia Mcalpine, MD;  Location:  [facility];  Service: Orthopedics;  Laterality: Right;  adductor canal   Social History:  reports that she has never smoked. She has never used smokeless tobacco. She reports that she does not drink alcohol and does not use drugs.  Allergies  Allergen Reactions   Hydrocortisone Itching   Lisinopril     Other reaction(s): Lethargy (intolerance)   Phenergan [Promethazine Hcl] Other (See Comments)    hallucinations   Statins Other (See Comments)    Severe myalgias   Trazodone And Nefazodone Cough   Prednisone Rash    Family History  Problem Relation Age of Onset   Cancer Mother        ovaian   Hypertension Father        kidney   Stroke Father     Cancer Father    Hypertension Sister    Diabetes Brother     Prior to Admission medications   Medication Sig Start Date End Date Taking? Authorizing Provider  acetaminophen (PAIN RELIEF EXTRA STRENGTH) 500 MG tablet TAKE (2) TABLETS BY MOUTH EVERY SIX HOURS A NEEDED FOR PAIN. Patient taking differently: Take 1,000 mg by mouth every 6 (six) hours as needed for moderate pain. 01/15/23   Babs Sciara, MD  ALPRAZolam Prudy Feeler) 0.25 MG tablet Take 1 tablet (0.25 mg total) by mouth at bedtime as needed for sleep. 03/26/23   Babs Sciara, MD  amLODipine (NORVASC) 10 MG tablet Take 1 tablet (10 mg total) by mouth daily. 12/02/22   Emokpae, Courage, MD  benzonatate (TESSALON) 100 MG capsule TAKE (1) CAPSULE BY MOUTH 3 TIMES DAILY AS NEEDED FOR COUGH. 11/21/22   Babs Sciara, MD  BETA CAROTENE PROVITAMIN A 66063 units capsule Take 25,000 Units by mouth daily. 01/01/23   [provider]  beta carotene w/minerals (OCUVITE) tablet Take 1 tablet by mouth at bedtime. Patient taking differently: Take 1 tablet by mouth daily. 07/03/22   Babs Sciara, MD  BISACODYL 5 MG EC tablet TAKE 1 TABLET BY MOUTH ONCE DAILY AS NEEDED FOR MODERATE CONSTIPATION. Patient taking differently: Take 5 mg by mouth daily as needed for moderate constipation. 12/20/22   Babs Sciara, MD  Carboxymethylcellulose Sodium (THERATEARS) 0.25 % SOLN Apply 1 drop to eye in the morning and at bedtime.    [provider]  COMBIVENT RESPIMAT 20-100 MCG/ACT AERS respimat INHALE (1) PUFF INTO THE LUNGS EVERY SIX HOURS AS NEEDED FOR WHEEZING OR SHORTNESS OF BREATH. Patient taking differently: Inhale 1 puff into the lungs every 6 (six) hours as needed for wheezing or shortness of breath. 11/21/22   Babs Sciara, MD  cyanocobalamin 1000 MCG tablet Take 1,000 mcg by mouth at bedtime.    [provider]  dicyclomine (BENTYL) 10 MG capsule TAKE 1 CAPSULE BY MOUTH TWICE DAILY AS NEEDED FOR ABDOMINAL CRAMPS AND LOOSE  STOOL. Patient taking differently: Take 10 mg by mouth 2 (two) times daily as needed (abdominal cramps and loose stools.). TAKE 1 CAPSULE BY MOUTH TWICE DAILY AS NEEDED FOR ABDOMINAL CRAMPS AND LOOSE STOOL. 11/21/22   Babs Sciara, MD  EASYMAX TEST test strip CHECK BLOOD SUGAR ONCE DAILY.(CALL MD IF BS BELOW 60: OR IF BS ABOVE 400) 02/03/23   Luking, [name], MD  gabapentin (NEURONTIN) 100 MG capsule 2 po bid Patient taking differently: Take 200 mg by mouth 2 (two) times daily. 11/19/22   Babs Sciara, MD  GOODSENSE HEMORRHOIDAL 0.25-14-74.9 % rectal ointment Place 1 Application rectally 3 (three) times  daily as needed for irritation. 12/26/21   [provider]  Iron, Ferrous Sulfate, 325 (65 Fe) MG TABS Take 325 mg by mouth every other day. 02/20/23   Babs Sciara, MD  loperamide (ANTI-DIARRHEAL) 2 MG tablet TAKE 1 TABLET BY MOUTH AS NEEDED FOR DIARRHEA OR LOOSE STOOL**MAX 4 TABLETS IN 24 HOURS** Patient taking differently: Take 2 mg by mouth 4 (four) times daily as needed for diarrhea or loose stools. 12/27/22   Babs Sciara, MD  loratadine (CLARITIN) 10 MG tablet Take 10 mg by mouth daily.    [provider]  losartan (COZAAR) 100 MG tablet Take 1 tablet daily Patient taking differently: Take 100 mg by mouth daily. 12/02/22   Shon Hale, MD  meclizine (ANTIVERT) 25 MG tablet Take 1 tablet (25 mg total) by mouth 3 (three) times daily as needed for dizziness or nausea. 01/24/23   Babs Sciara, MD  melatonin 3 MG TABS tablet Take 3 mg by mouth at bedtime.    [provider]  metFORMIN (GLUCOPHAGE) 500 MG tablet Take one tablet 500mg  po in the morning and 1/2 tablet 250 mg po at supper Patient taking differently: Take 750 mg by mouth daily. Take one tablet 500mg  po in the morning and 1/2 tablet 250 mg po at supper 12/02/22   Shon Hale, MD  Multiple Vitamins-Minerals (PRESERVISION AREDS 2) CAPS Take 1 capsule by mouth in the morning and at bedtime.     [provider]  Omega-3 Fatty Acids (FISH OIL) 1000 MG CAPS One capsule BID Patient taking differently: Take 1 capsule by mouth in the morning and at bedtime. 08/07/21   Babs Sciara, MD  pantoprazole (PROTONIX) 40 MG tablet Take 1 tablet (40 mg total) by mouth daily. 02/11/23   Babs Sciara, MD  prochlorperazine (COMPAZINE) 10 MG tablet Take 1 tablet (10 mg total) by mouth every 6 (six) hours as needed for nausea or vomiting. 04/06/23   Shon Hale, MD  Vitamin D, Cholecalciferol, 10 MCG (400 UNIT) TABS TAKE 1 TABLET BY MOUTH ONCE DAILY. 05/12/20   Babs Sciara, MD    Physical Exam: Vitals:   04/07/23 1430 04/07/23 1537 04/07/23 1550 04/07/23 1600  BP: (!) 160/54  122/64 (!) 147/58  Pulse: 75 67 63 67  Resp:  15 14 16   Temp: 98.6 F (37 C)     TempSrc: Oral     SpO2: 95% 97% 97% 97%   Physical Exam:  General: well developed, well nourished HEENT: Normocephalic, atraumatic, PERRL Cardiovascular: Normal rate and rhythm. Distal pulses intact. Pulmonary: Normal pulmonary effort, normal breath sounds Gastrointestinal: distended abdomen, soft, non-tender, normoactive bowel sounds, Musculoskeletal:Normal ROM, no lower ext edema Lymphadenopathy: No cervical LAD. Skin: Skin is warm and dry. Neuro: No focal deficits noted, AAOx3. PSYCH: Attentive and cooperative  Data Reviewed:  Results for orders placed or performed during the hospital encounter of 04/07/23 (from the past 24 hour(s))  CBC with Differential     Status: Abnormal   Collection Time: 04/07/23  2:55 PM  Result Value Ref Range   WBC 7.2 4.0 - 10.5 K/uL   RBC 4.03 3.87 - 5.11 MIL/uL   Hemoglobin 11.7 (L) 12.0 - 15.0 g/dL   HCT 72.5 (L) 36.6 - 44.0 %   MCV 88.8 80.0 - 100.0 fL   MCH 29.0 26.0 - 34.0 pg   MCHC 32.7 30.0 - 36.0 g/dL   RDW 34.7 42.5 - 95.6 %   Platelets 245 150 - 400  K/uL   nRBC 0.0 0.0 - 0.2 %   Neutrophils Relative % 65 %   Neutro Abs 4.7 1.7 - 7.7 K/uL   Lymphocytes Relative 23 %    Lymphs Abs 1.7 0.7 - 4.0 K/uL   Monocytes Relative 10 %   Monocytes Absolute 0.7 0.1 - 1.0 K/uL   Eosinophils Relative 2 %   Eosinophils Absolute 0.1 0.0 - 0.5 K/uL   Basophils Relative 0 %   Basophils Absolute 0.0 0.0 - 0.1 K/uL   Immature Granulocytes 0 %   Abs Immature Granulocytes 0.02 0.00 - 0.07 K/uL  Comprehensive metabolic panel     Status: Abnormal   Collection Time: 04/07/23  2:55 PM  Result Value Ref Range   Sodium 126 (L) 135 - 145 mmol/L   Potassium 3.9 3.5 - 5.1 mmol/L   Chloride 90 (L) 98 - 111 mmol/L   CO2 28 22 - 32 mmol/L   Glucose, Bld 107 (H) 70 - 99 mg/dL   BUN 16 8 - 23 mg/dL   Creatinine, Ser 9.60 0.44 - 1.00 mg/dL   Calcium 8.6 (L) 8.9 - 10.3 mg/dL   Total Protein 6.3 (L) 6.5 - 8.1 g/dL   Albumin 3.5 3.5 - 5.0 g/dL   AST 17 15 - 41 U/L   ALT 15 0 - 44 U/L   Alkaline Phosphatase 68 38 - 126 U/L   Total Bilirubin 0.6 0.3 - 1.2 mg/dL   GFR, Estimated >45 >40 mL/min   Anion gap 8 5 - 15  Brain natriuretic peptide     Status: None   Collection Time: 04/07/23  2:55 PM  Result Value Ref Range   B Natriuretic Peptide 70.0 0.0 - 100.0 pg/mL      CTA 03/07/2023 IMPRESSION: 1. No acute or focal lesion to explain the patient's symptoms. 2. 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 An accessory left renal artery feeds the lower pole of the left kidney without focal stenosis. 3. 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 4. 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 5. Atherosclerotic changes at the origin of the SMA without significant stenosis relative to the more distal vessel. 6. Stable cholelithiasis without evidence of cholecystitis. 7. Simple cysts of the right kidney. No follow-up imaging is recommended. JACR 2018 Feb; 264-273, Management of the Incidental Renal Mass on CT, [employer] 2021; 814-848, Bosniak Classification of Cystic Renal Masses, Version 2019. 8. Ventral hernia  repair is intact. 9.  Aortic Atherosclerosis (ICD10-I70.0).    Assessment: High grade vascular stenoses IMA, renal arteries Recurrent diarrhea/ No appetite Failure to thrive/ weakness Recurrent hyponatremia Cough/ Hypoxia, R/O Pneumonia vs CHF (No fever or elevated wbc)   Plan: Consult surgery or vascular surgery to start IV fluids and IV antibiotics empirically NPO    Advance Care Planning:   Code Status: Prior  The patient has a yellow out of facility DNR form. Her son is her health care power of attorney.  Consults: in am  Family Communication: The patient's son was at her bedside.  Severity of Illness: The appropriate patient status for this patient is INPATIENT. Inpatient status is judged to be reasonable and necessary in order to provide the required intensity of service to ensure the patient's safety. The patient's presenting symptoms, physical exam findings, and initial radiographic and laboratory data in the context of their chronic comorbidities is felt to place them at high risk for further clinical deterioration. Furthermore, it is not anticipated  that the patient will be medically stable for discharge from the hospital within 2 midnights of admission.   * I certify that at the point of admission it is my clinical judgment that the patient will require inpatient hospital care spanning beyond 2 midnights from the point of admission due to high intensity of service, high risk for further deterioration and high frequency of surveillance required.*  Author: Buena Irish, MD 04/07/2023 5:50 PM  For on call review [email or web address].

## 2023-04-07 NOTE — Telephone Encounter (Signed)
Call received from patient and [name] from [facility], stating patient is having a lot of chest congestion and cough, no fever of any other symptoms. she has been seen in the ER very frequently lately for low sodium and frequent UTI's. She had chest xrays done in ER and would like the doctor to review and see if there is anything that can be done until she can follow up in the office. Please advise

## 2023-04-07 NOTE — ED Provider Notes (Signed)
[place] [facility] DE[facility]MENT AT [facility] Provider Note   CSN: [id number] Arrival date & time: 04/07/23  1420     History  Chief Complaint  Patient presents with   Shortness of Breath    [name] is a 89 y.o. female.   Shortness of Breath Associated symptoms: cough   Patient presents for cough, fatigue, shortness of breath.  Medical history includes HTN, DM, HLD, cognitive dysfunction, anxiety, neuropathy.  She was seen in the ED yesterday with concerns of cough, diarrhea, and generalized bodyaches.  Onset of symptoms was 4 days ago.  Chest x-ray yesterday was equivocal with concern of possible increased density along the left heart border and lateral left hemidiaphragm.  Lab work yesterday was notable for sodium of 126.  Hospitalist was consulted for admission.  Hospitalist evaluated and felt the patient was stable for discharge back to facility.  She arrives today via EMS from nursing facility for ongoing shortness of breath.  Today, EMS noted SpO2 of 88% on room air.  Patient reports worsened symptoms of cough, generalized weakness, and fatigue since yesterday.  At baseline, she does ambulate with a walker.  She has been unable to ambulate in the past 2 days.  She has had loss of appetite.  She did force yourself to eat some breakfast this morning.     Home Medications Prior to Admission medications   Medication Sig Start Date End Date Taking? Authorizing Provider  acetaminophen (PAIN RELIEF EXTRA STRENGTH) 500 MG tablet TAKE (2) TABLETS BY MOUTH EVERY SIX HOURS A NEEDED FOR PAIN. Patient taking differently: Take 1,000 mg by mouth every 6 (six) hours as needed for moderate pain. 01/15/23   Babs Sciara, MD  ALPRAZolam Prudy Feeler) 0.25 MG tablet Take 1 tablet (0.25 mg total) by mouth at bedtime as needed for sleep. 03/26/23   Babs Sciara, MD  amLODipine (NORVASC) 10 MG tablet Take 1 tablet (10 mg total) by mouth daily. 12/02/22   Emokpae, Courage, MD  benzonatate  (TESSALON) 100 MG capsule TAKE (1) CAPSULE BY MOUTH 3 TIMES DAILY AS NEEDED FOR COUGH. 11/21/22   Babs Sciara, MD  BETA CAROTENE PROVITAMIN A 86578 units capsule Take 25,000 Units by mouth daily. 01/01/23   [provider]  beta carotene w/minerals (OCUVITE) tablet Take 1 tablet by mouth at bedtime. Patient taking differently: Take 1 tablet by mouth daily. 07/03/22   Babs Sciara, MD  BISACODYL 5 MG EC tablet TAKE 1 TABLET BY MOUTH ONCE DAILY AS NEEDED FOR MODERATE CONSTIPATION. Patient taking differently: Take 5 mg by mouth daily as needed for moderate constipation. 12/20/22   Babs Sciara, MD  Carboxymethylcellulose Sodium (THERATEARS) 0.25 % SOLN Apply 1 drop to eye in the morning and at bedtime.    [provider]  COMBIVENT RESPIMAT 20-100 MCG/ACT AERS respimat INHALE (1) PUFF INTO THE LUNGS EVERY SIX HOURS AS NEEDED FOR WHEEZING OR SHORTNESS OF BREATH. Patient taking differently: Inhale 1 puff into the lungs every 6 (six) hours as needed for wheezing or shortness of breath. 11/21/22   Babs Sciara, MD  cyanocobalamin 1000 MCG tablet Take 1,000 mcg by mouth at bedtime.    [provider]  dicyclomine (BENTYL) 10 MG capsule TAKE 1 CAPSULE BY MOUTH TWICE DAILY AS NEEDED FOR ABDOMINAL CRAMPS AND LOOSE STOOL. Patient taking differently: Take 10 mg by mouth 2 (two) times daily as needed (abdominal cramps and loose stools.). TAKE 1 CAPSULE BY MOUTH TWICE DAILY AS NEEDED  FOR ABDOMINAL CRAMPS AND LOOSE STOOL. 11/21/22   Babs Sciara, MD  EASYMAX TEST test strip CHECK BLOOD SUGAR ONCE DAILY.(CALL MD IF BS BELOW 60: OR IF BS ABOVE 400) 02/03/23   Luking, [name], MD  gabapentin (NEURONTIN) 100 MG capsule 2 po bid Patient taking differently: Take 200 mg by mouth 2 (two) times daily. 11/19/22   Babs Sciara, MD  GOODSENSE HEMORRHOIDAL 0.25-14-74.9 % rectal ointment Place 1 Application rectally 3 (three) times daily as needed for irritation. 12/26/21   [provider]  Iron, Ferrous Sulfate, 325 (65 Fe) MG TABS Take 325 mg by mouth every other day. 02/20/23   Babs Sciara, MD  loperamide (ANTI-DIARRHEAL) 2 MG tablet TAKE 1 TABLET BY MOUTH AS NEEDED FOR DIARRHEA OR LOOSE STOOL**MAX 4 TABLETS IN 24 HOURS** Patient taking differently: Take 2 mg by mouth 4 (four) times daily as needed for diarrhea or loose stools. 12/27/22   Babs Sciara, MD  loratadine (CLARITIN) 10 MG tablet Take 10 mg by mouth daily.    [provider]  losartan (COZAAR) 100 MG tablet Take 1 tablet daily Patient taking differently: Take 100 mg by mouth daily. 12/02/22   Shon Hale, MD  meclizine (ANTIVERT) 25 MG tablet Take 1 tablet (25 mg total) by mouth 3 (three) times daily as needed for dizziness or nausea. 01/24/23   Babs Sciara, MD  melatonin 3 MG TABS tablet Take 3 mg by mouth at bedtime.    [provider]  metFORMIN (GLUCOPHAGE) 500 MG tablet Take one tablet 500mg  po in the morning and 1/2 tablet 250 mg po at supper Patient taking differently: Take 750 mg by mouth daily. Take one tablet 500mg  po in the morning and 1/2 tablet 250 mg po at supper 12/02/22   Shon Hale, MD  Multiple Vitamins-Minerals (PRESERVISION AREDS 2) CAPS Take 1 capsule by mouth in the morning and at bedtime.    [provider]  Omega-3 Fatty Acids (FISH OIL) 1000 MG CAPS One capsule BID Patient taking differently: Take 1 capsule by mouth in the morning and at bedtime. 08/07/21   Babs Sciara, MD  pantoprazole (PROTONIX) 40 MG tablet Take 1 tablet (40 mg total) by mouth daily. 02/11/23   Babs Sciara, MD  prochlorperazine (COMPAZINE) 10 MG tablet Take 1 tablet (10 mg total) by mouth every 6 (six) hours as needed for nausea or vomiting. 04/06/23   Shon Hale, MD  Vitamin D, Cholecalciferol, 10 MCG (400 UNIT) TABS TAKE 1 TABLET BY MOUTH ONCE DAILY. 05/12/20   Babs Sciara, MD      Allergies    Hydrocortisone, Lisinopril, Phenergan [promethazine hcl], Statins,  Trazodone and nefazodone, and Prednisone    Review of Systems   Review of Systems  Constitutional:  Positive for activity change, appetite change and fatigue.  Respiratory:  Positive for cough and shortness of breath.   Gastrointestinal:  Positive for diarrhea and nausea.  Musculoskeletal:  Positive for myalgias.  Neurological:  Positive for weakness (Generalized).  All other systems reviewed and are negative.   Physical Exam Updated Vital Signs BP (!) 147/58   Pulse 67   Temp 98.6 F (37 C) (Oral)   Resp 16   SpO2 97%  Physical Exam Vitals and nursing note reviewed.  Constitutional:      General: She is not in acute distress.    Appearance: She is well-developed. She is not ill-appearing, toxic-appearing or diaphoretic.  HENT:  Head: Normocephalic and atraumatic.     Mouth/Throat:     Mouth: Mucous membranes are moist.  Eyes:     Conjunctiva/sclera: Conjunctivae normal.  Cardiovascular:     Rate and Rhythm: Normal rate and regular rhythm.     Heart sounds: No murmur heard. Pulmonary:     Effort: Pulmonary effort is normal. Tachypnea present. No respiratory distress.     Breath sounds: Rhonchi present. No wheezing.  Chest:     Chest wall: No tenderness.  Abdominal:     Palpations: Abdomen is soft.     Tenderness: There is no abdominal tenderness.  Musculoskeletal:        General: No swelling. Normal range of motion.     Cervical back: Normal range of motion and neck supple.     Right lower leg: No edema.     Left lower leg: No edema.  Skin:    General: Skin is warm and dry.     Coloration: Skin is pale. Skin is not cyanotic.  Neurological:     General: No focal deficit present.     Mental Status: She is alert and oriented to person, place, and time.  Psychiatric:        Mood and Affect: Mood normal.        Behavior: Behavior normal.     ED Results / Procedures / Treatments   Labs (all labs ordered are listed, but only abnormal results are  displayed) Labs Reviewed  CBC WITH DIFFERENTIAL/PLATELET - Abnormal; Notable for the following components:      Result Value   Hemoglobin 11.7 (*)    HCT 35.8 (*)    All other components within normal limits  COMPREHENSIVE METABOLIC PANEL - Abnormal; Notable for the following components:   Sodium 126 (*)    Chloride 90 (*)    Glucose, Bld 107 (*)    Calcium 8.6 (*)    Total Protein 6.3 (*)    All other components within normal limits  BRAIN NATRIURETIC PEPTIDE    EKG EKG Interpretation Date/Time:  Monday April 07 2023 14:54:50 EDT Ventricular Rate:  70 PR Interval:  190 QRS Duration:  85 QT Interval:  402 QTC Calculation: 434 R Axis:   21  Text Interpretation: Sinus rhythm Anterior infarct, old Confirmed by Gloris Manchester [phone]) on 04/07/2023 3:03:43 PM  Radiology DG Chest Portable 1 View  Result Date: 04/07/2023 CLINICAL DATA:  Shortness of breath EXAM: PORTABLE CHEST 1 VIEW COMPARISON:  Chest x-ray dated April 06, 2023 FINDINGS: Patient rotation to the right somewhat limits evaluation. Cardiac and mediastinal contours are within normal limits when accounting for patient rotation. Increased right greater than left basilar opacities. No large pleural effusion or evidence of pneumothorax. IMPRESSION: Increased right greater than left basilar opacities, concerning for infection or aspiration. Recommend follow-up PA and lateral chest radiograph in 6-8 weeks to ensure resolution. Electronically Signed   By: Allegra Lai M.D.   On: 04/07/2023 15:55   DG Chest Portable 1 View  Result Date: 04/06/2023 CLINICAL DATA:  Cough.  COVID positive. EXAM: PORTABLE CHEST 1 VIEW COMPARISON:  03/16/2023 FINDINGS: AP portable radiograph with mild apical lordotic positioning. Numerous leads and wires project over the chest. Midline trachea. Mild cardiomegaly. Atherosclerosis in the transverse aorta. No pleural effusion or pneumothorax. Medial right lung base opacity is similar and likely due to  scarring. There may be increased density along the left heart border and lateral left hemidiaphragm. No congestive failure. IMPRESSION: Possible increased density along  the left heart border and lateral left hemidiaphragm. Cannot exclude left base airspace disease. Consider further evaluation with PA and lateral radiographs. Cardiomegaly without congestive failure. Aortic Atherosclerosis (ICD10-I70.0). Electronically Signed   By: Jeronimo Greaves M.D.   On: 04/06/2023 10:01    Procedures Procedures    Medications Ordered in ED Medications  azithromycin (ZITHROMAX) 500 mg in sodium chloride 0.9 % 250 mL IVPB (500 mg Intravenous New Bag/Given 04/07/23 1618)  sodium chloride 0.9 % bolus 1,000 mL (has no administration in time range)  cefTRIAXone (ROCEPHIN) 1 g in sodium chloride 0.9 % 100 mL IVPB (0 g Intravenous Stopped 04/07/23 1618)    ED Course/ Medical Decision Making/ A&P                                 Medical Decision Making  This patient presents to the ED for concern of fatigue and shortness of breath, this involves an extensive number of treatment options, and is a complaint that carries with it a high risk of complications and morbidity.  The differential diagnosis includes pneumonia, deconditioning, polypharmacy, dehydration, CHF, PE   Co morbidities that complicate the patient evaluation  HTN, DM, HLD, cognitive dysfunction, anxiety, neuropathy   Additional history obtained:  Additional history obtained from N/A External records from outside source obtained and reviewed including EMR   Lab Tests:  I Ordered, and personally interpreted labs.  The pertinent results include: Mild decrease in hemoglobin from baseline, no leukocytosis, normal BNP, normal kidney function.  Hyponatremia is present, consistent with lab work from yesterday.   Imaging Studies ordered:  I ordered imaging studies including chest x-ray I independently visualized and interpreted imaging which showed  increased bibasilar opacities, greater on the right consistent with infection and/or aspiration I agree with the radiologist interpretation   Cardiac Monitoring: / EKG:  The patient was maintained on a cardiac monitor.  I personally viewed and interpreted the cardiac monitored which showed an underlying rhythm of: Sinus rhythm  Problem List / ED Course / Critical interventions / Medication management  Patient presenting for worsening symptoms of cough, generalized weakness, fatigue, and poor appetite.  She was seen in the ED yesterday.  Today, she was noted to have new onset hypoxia.  On assessment, she is on 2.5 L of supplemental oxygen.  SpO2 is in the high 90s.  Rhonchi are present on lung auscultation.  A cough is present on exam.  Patient denies any areas of pain currently.  Chest x-ray today does show increased bibasilar opacities.  Antibiotics were ordered for treatment of pneumonia.  Lab work shows redemonstration of hyponatremia.  IV fluids were ordered.  Patient was admitted for further management. I ordered medication including IV fluids for hydration; ceftriaxone and azithromycin for pneumonia Reevaluation of the patient after these medicines showed that the patient improved I have reviewed the patients home medicines and have made adjustments as needed   Social Determinants of Health:  Resides in nursing facility  CRITICAL CARE Performed by: Gloris Manchester   Total critical care time: 32 minutes  Critical care time was exclusive of separately billable procedures and treating other patients.  Critical care was necessary to treat or prevent imminent or life-threatening deterioration.  Critical care was time spent personally by me on the following activities: development of treatment plan with patient and/or surrogate as well as nursing, discussions with consultants, evaluation of patient's response to treatment, examination of patient, obtaining  history from patient or surrogate,  ordering and performing treatments and interventions, ordering and review of laboratory studies, ordering and review of radiographic studies, pulse oximetry and re-evaluation of patient's condition.         Final Clinical Impression(s) / ED Diagnoses Final diagnoses:  Pneumonia of both lower lobes due to infectious organism  Acute respiratory failure with hypoxia (HCC)  Hyponatremia    Rx / DC Orders ED Discharge Orders     None         Gloris Manchester, MD 04/07/23 1645

## 2023-04-07 NOTE — ED Notes (Signed)
Three or four days ago, started with a scratchy throat. Has gotten worse, progressed to cough and shortness of breath.

## 2023-04-07 NOTE — ED Notes (Addendum)
Pt placed on 2 L [place] and SpO2 93-94%, pt then placed on 3 L and SpO2 95-96%

## 2023-04-07 NOTE — ED Notes (Signed)
Pt also c/o generalized weakness and "just feels really bad and hurts all over"

## 2023-04-07 NOTE — Telephone Encounter (Signed)
Spoke with a representative at [facility] and informed and scheduled for Wednesday at 1100 , pt will call back if not able to make the appt .

## 2023-04-07 NOTE — Telephone Encounter (Signed)
[name] went to the hospital again this weekend they run test said sodium dropped again give her sodium chloride and sent back. Needs blood test [name] around 10/9 follow up with Dr Lorin Picket   [facility] called

## 2023-04-07 NOTE — Telephone Encounter (Signed)
Nurses Please see previous telephone message from today which was sent to the pool She does need a follow-up office visit this week but if you see the telephone message it has the details of follow-up I do not have any additional orders to add other than to repeat the labs as stated (Obviously if she gets worse between now and her office visit she may have to go back to the ER)

## 2023-04-07 NOTE — ED Notes (Signed)
DNR bracelet applied to patients right arm. DNR paper in room.

## 2023-04-07 NOTE — ED Notes (Signed)
Pt placed on bedpan and voided approximately 200 cc of clear, amber urine.

## 2023-04-07 NOTE — ED Notes (Signed)
Pt again placed on bedpan.

## 2023-04-07 NOTE — ED Triage Notes (Signed)
Pt arrived via [facility] from [facility], c/o ongoing sob, was here yesterday and disharged with bronchitis pts SpO2 on RA was 88%, placed on 2 L [place]

## 2023-04-07 NOTE — Telephone Encounter (Signed)
Diagnosis hyponatremia Please order serum osmolality, metabolic 7, also urine sodium, urine osmolality, cortisol, TSH If possible she can do this blood work on one of the days this week  As for myself currently I am out of the office much of this week and my remaining time is unfortunately full [facility] does have some openings please see if we can set her up on Friday for [facility] but perhaps if Thelma Barge could do her blood work tomorrow morning we would have results back by Wednesday and can look at those thank you some of the test results would take a few days to get back thank you

## 2023-04-08 ENCOUNTER — Encounter (HOSPITAL_COMMUNITY): Payer: Self-pay | Admitting: Internal Medicine

## 2023-04-08 DIAGNOSIS — I774 Celiac artery compression syndrome: Secondary | ICD-10-CM

## 2023-04-08 DIAGNOSIS — Z7189 Other specified counseling: Secondary | ICD-10-CM

## 2023-04-08 DIAGNOSIS — R634 Abnormal weight loss: Secondary | ICD-10-CM

## 2023-04-08 DIAGNOSIS — R109 Unspecified abdominal pain: Secondary | ICD-10-CM

## 2023-04-08 DIAGNOSIS — K551 Chronic vascular disorders of intestine: Secondary | ICD-10-CM | POA: Diagnosis not present

## 2023-04-08 DIAGNOSIS — R197 Diarrhea, unspecified: Secondary | ICD-10-CM

## 2023-04-08 DIAGNOSIS — Z515 Encounter for palliative care: Secondary | ICD-10-CM | POA: Diagnosis not present

## 2023-04-08 DIAGNOSIS — G8929 Other chronic pain: Secondary | ICD-10-CM

## 2023-04-08 DIAGNOSIS — R638 Other symptoms and signs concerning food and fluid intake: Secondary | ICD-10-CM

## 2023-04-08 LAB — GLUCOSE, CAPILLARY
Glucose-Capillary: 129 mg/dL — ABNORMAL HIGH (ref 70–99)
Glucose-Capillary: 143 mg/dL — ABNORMAL HIGH (ref 70–99)
Glucose-Capillary: 188 mg/dL — ABNORMAL HIGH (ref 70–99)
Glucose-Capillary: 220 mg/dL — ABNORMAL HIGH (ref 70–99)

## 2023-04-08 LAB — COMPREHENSIVE METABOLIC PANEL
ALT: 14 U/L (ref 0–44)
AST: 17 U/L (ref 15–41)
Albumin: 3.3 g/dL — ABNORMAL LOW (ref 3.5–5.0)
Alkaline Phosphatase: 61 U/L (ref 38–126)
Anion gap: 10 (ref 5–15)
BUN: 12 mg/dL (ref 8–23)
CO2: 27 mmol/L (ref 22–32)
Calcium: 8.8 mg/dL — ABNORMAL LOW (ref 8.9–10.3)
Chloride: 96 mmol/L — ABNORMAL LOW (ref 98–111)
Creatinine, Ser: 0.57 mg/dL (ref 0.44–1.00)
GFR, Estimated: >60 mL/min (ref >60)
Glucose, Bld: 117 mg/dL — ABNORMAL HIGH (ref 70–99)
Potassium: 3.7 mmol/L (ref 3.5–5.1)
Sodium: 133 mmol/L — ABNORMAL LOW (ref 135–145)
Total Bilirubin: 1 mg/dL (ref 0.3–1.2)
Total Protein: 6 g/dL — ABNORMAL LOW (ref 6.5–8.1)

## 2023-04-08 LAB — SODIUM, URINE, RANDOM: Sodium, Ur: 66 mmol/L

## 2023-04-08 LAB — LEGIONELLA PNEUMOPHILA SEROGP 1 UR AG: L. pneumophila Serogp 1 Ur Ag: NEGATIVE

## 2023-04-08 LAB — TSH: TSH: 1.161 u[IU]/mL (ref 0.350–4.500)

## 2023-04-08 LAB — OSMOLALITY, URINE: Osmolality, Ur: 485 mosm/kg (ref 300–900)

## 2023-04-08 LAB — OSMOLALITY: Osmolality: 288 mosm/kg (ref 275–295)

## 2023-04-08 LAB — CBG MONITORING, ED: Glucose-Capillary: 112 mg/dL — ABNORMAL HIGH (ref 70–99)

## 2023-04-08 LAB — BRAIN NATRIURETIC PEPTIDE: B Natriuretic Peptide: 83 pg/mL (ref 0.0–100.0)

## 2023-04-08 LAB — MRSA NEXT GEN BY PCR, NASAL: MRSA by PCR Next Gen: NOT DETECTED

## 2023-04-08 MED ORDER — PANTOPRAZOLE SODIUM 40 MG PO TBEC
40.0000 mg | DELAYED_RELEASE_TABLET | Freq: Two times a day (BID) | ORAL | Status: DC
  Administered 2023-04-08 – 2023-04-15 (×13): 40 mg via ORAL
  Filled 2023-04-08 (×13): qty 1

## 2023-04-08 MED ORDER — ALPRAZOLAM 0.25 MG PO TABS
0.2500 mg | ORAL_TABLET | Freq: Three times a day (TID) | ORAL | Status: DC | PRN
  Administered 2023-04-08 – 2023-04-10 (×6): 0.25 mg via ORAL
  Filled 2023-04-08 (×6): qty 1

## 2023-04-08 MED ORDER — ENOXAPARIN SODIUM 40 MG/0.4ML IJ SOSY
40.0000 mg | PREFILLED_SYRINGE | INTRAMUSCULAR | Status: DC
  Administered 2023-04-08 – 2023-04-14 (×6): 40 mg via SUBCUTANEOUS
  Filled 2023-04-08 (×7): qty 0.4

## 2023-04-08 MED ORDER — LACTATED RINGERS IV SOLN: INTRAVENOUS | Status: DC

## 2023-04-08 NOTE — Progress Notes (Signed)
Referring Provider: [facility]  Primary Care Physician:  Babs Sciara, MD Primary Gastroenterologist: Gentry Fitz.  Previously saw [facility] during admission in 2016.  Date of Admission: 04/07/23 Date of Consultation: 04/08/23  Reason for Consultation:  Concern for mesenteric ischemia  HPI:  [name] is a 89 y.o. year old female with medical history significant for type 2 diabetes, HTN, HLD, hemicolectomy in 2011 following colonic perforation in the setting of diverticulitis, knee replacement in 2023, previously doing very well with walking a mile a day at the end of last year.  Earlier this year, she was down to half a mile a day.  Over the last 49-month, she is had progressive decline in her health overall, intermittent diarrhea, poor appetite, progressive weakness.  She has had multiple ER visits.  Usually hyponatremia is found.  She has also been treated for multiple urinary tract infections.  Despite fluids for hyponatremia and multiple rounds of antibiotics she is not feeling better. She presented to the emergency room yesterday with progressive weakness and poor appetite.   ED course: Initial vitals: BP 135/62, SpO2 88%, pulse rate 77, respiratory rate 20. She was placed on supplemental O2 via nasal cannula, 2L, with improvement in O2 saturations.  Labs remarkable for hemoglobin 11.7 with normocytic indices, sodium 126. UA with trace leukocytes, 11-20 white blood cells, but no bacteria. COVID test was negative.  CT chest with increased right greater than left basilar opacities concerning for infection or aspiration.  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 Atherosclerotic changes at the origin of SMA without significant stenosis. Cholelithiasis without cholecystitis. Multiple  small bowel loops adherent to anterior peritoneum.  She was ultimately admitted for further evaluation of generalized weakness and poor appetite, started on IV fluids and IV antibiotics.    Consult:  For the last 3 months, she has had recurrent ER visits. States she just can't function. Thought it was from medication. About 30 minutes after eating, she gets abdominal burning, nausea, and is "totally lethargic". Doesn't matter what she eats. Makes her not want to eat because she knows it will hurt. This has been progressively worsening over the last couple of weeks. No vomiting. Belches and burps a lot after eating. No heartburn. No dysphagia. Once symptoms start, they last for hours.   No similar symptoms in the past.   Tried rollaids the other day but isn't sure they helped much. Hasn't tried anything else.  Resolved daily as listed on her med list the patient states she is not taking this.   No NSAIDs. Only tylenol.   States she has lost some weight.  Reports she was 181 pounds prior to onset of symptoms and is currently weighing around 164 pounds.  Also with diarrhea for the last few months. Last Friday, had 2 hard stools, then 2 loose stools, then took 2 imodium and hans't had a BM since Friday. She is passing gas. No brbpr or melena.  States in general, she will take Imodium 1-2 times a week and often skips days without a bowel movement after taking Imodium, then stool started this hard, then become loose again.  States since July, hasn't felt herself.   States prior to all of this starting 3 months ago, she was walking 1 mile a day. Then cut back to 0.5 miles a  day per PCPs recommendations due to not feeling well.  Had been doing this up until Thursday last week.  States she has always been active her whole life.  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   *Stool studies positive for Sapovirus in June 2024.    Past Medical History:  Diagnosis Date    Blood transfusion without reported diagnosis    Cognitive dysfunction 03/04/2019   Patient scores 21 out of 30 on Montreal cognitive assessment September 2020   Diabetes mellitus without complication (HCC)    Diabetic peripheral neuropathy associated with type 2 diabetes mellitus (HCC) 02/25/2022   Diverticulitis    Frailty 03/04/2019   H/O bilateral breast reduction surgery    Hyperlipidemia    a. intolerant to statins.    Hypertension     Past Surgical History:  Procedure Laterality Date   ABDOMINAL HYSTERECTOMY  2003   APPENDECTOMY     age 65   BREAST REDUCTION SURGERY     age 49   COLON SURGERY Left 2011   Hemicolectomy due to diverticulitis   EYE SURGERY  03/30/2009   cataract   KNEE SURGERY Right 2003   knee cap   LAPAROSCOPIC INCISIONAL / UMBILICAL / VENTRAL HERNIA REPAIR  02/23/2007   REDUCTION MAMMAPLASTY Bilateral 2001   REFRACTIVE SURGERY     TOTAL KNEE ARTHROPLASTY Right 04/10/2022   Procedure: TOTAL KNEE ARTHROPLASTY;  Surgeon: Eugenia Mcalpine, MD;  Location: [facility];  Service: Orthopedics;  Laterality: Right;  adductor canal    Prior to Admission medications   Medication Sig Start Date End Date Taking? Authorizing Provider  acetaminophen (PAIN RELIEF EXTRA STRENGTH) 500 MG tablet TAKE (2) TABLETS BY MOUTH EVERY SIX HOURS A NEEDED FOR PAIN. Patient taking differently: Take 1,000 mg by mouth every 6 (six) hours as needed for moderate pain. 01/15/23   Babs Sciara, MD  ALPRAZolam Prudy Feeler) 0.25 MG tablet Take 1 tablet (0.25 mg total) by mouth at bedtime as needed for sleep. 03/26/23   Babs Sciara, MD  amLODipine (NORVASC) 10 MG tablet Take 1 tablet (10 mg total) by mouth daily. 12/02/22   Emokpae, Courage, MD  benzonatate (TESSALON) 100 MG capsule TAKE (1) CAPSULE BY MOUTH 3 TIMES DAILY AS NEEDED FOR COUGH. 11/21/22   Babs Sciara, MD  BETA CAROTENE PROVITAMIN A 24401 units capsule Take 25,000 Units by mouth daily. 01/01/23   [provider]  beta  carotene w/minerals (OCUVITE) tablet Take 1 tablet by mouth at bedtime. Patient taking differently: Take 1 tablet by mouth daily. 07/03/22   Babs Sciara, MD  BISACODYL 5 MG EC tablet TAKE 1 TABLET BY MOUTH ONCE DAILY AS NEEDED FOR MODERATE CONSTIPATION. Patient taking differently: Take 5 mg by mouth daily as needed for moderate constipation. 12/20/22   Babs Sciara, MD  Carboxymethylcellulose Sodium (THERATEARS) 0.25 % SOLN Apply 1 drop to eye in the morning and at bedtime.    [provider]  COMBIVENT RESPIMAT 20-100 MCG/ACT AERS respimat INHALE (1) PUFF INTO THE LUNGS EVERY SIX HOURS AS NEEDED FOR WHEEZING OR SHORTNESS OF BREATH. Patient taking differently: Inhale 1 puff into the lungs every 6 (six) hours as needed for wheezing or shortness of breath. 11/21/22   Babs Sciara, MD  cyanocobalamin 1000 MCG tablet Take 1,000 mcg by mouth at bedtime.    [provider]  dicyclomine (BENTYL) 10 MG capsule TAKE 1 CAPSULE BY MOUTH TWICE DAILY AS NEEDED FOR ABDOMINAL  CRAMPS AND LOOSE STOOL. Patient taking differently: Take 10 mg by mouth 2 (two) times daily as needed (abdominal cramps and loose stools.). TAKE 1 CAPSULE BY MOUTH TWICE DAILY AS NEEDED FOR ABDOMINAL CRAMPS AND LOOSE STOOL. 11/21/22   Babs Sciara, MD  EASYMAX TEST test strip CHECK BLOOD SUGAR ONCE DAILY.(CALL MD IF BS BELOW 60: OR IF BS ABOVE 400) 02/03/23   Luking, [name], MD  gabapentin (NEURONTIN) 100 MG capsule 2 po bid Patient taking differently: Take 200 mg by mouth 2 (two) times daily. 11/19/22   Babs Sciara, MD  GOODSENSE HEMORRHOIDAL 0.25-14-74.9 % rectal ointment Place 1 Application rectally 3 (three) times daily as needed for irritation. 12/26/21   [provider]  Iron, Ferrous Sulfate, 325 (65 Fe) MG TABS Take 325 mg by mouth every other day. 02/20/23   Babs Sciara, MD  loperamide (ANTI-DIARRHEAL) 2 MG tablet TAKE 1 TABLET BY MOUTH AS NEEDED FOR DIARRHEA OR LOOSE STOOL**MAX 4 TABLETS IN 24  HOURS** Patient taking differently: Take 2 mg by mouth 4 (four) times daily as needed for diarrhea or loose stools. 12/27/22   Babs Sciara, MD  loratadine (CLARITIN) 10 MG tablet Take 10 mg by mouth daily.    [provider]  losartan (COZAAR) 100 MG tablet Take 1 tablet daily Patient taking differently: Take 100 mg by mouth daily. 12/02/22   Shon Hale, MD  meclizine (ANTIVERT) 25 MG tablet Take 1 tablet (25 mg total) by mouth 3 (three) times daily as needed for dizziness or nausea. 01/24/23   Babs Sciara, MD  melatonin 3 MG TABS tablet Take 3 mg by mouth at bedtime.    [provider]  metFORMIN (GLUCOPHAGE) 500 MG tablet Take one tablet 500mg  po in the morning and 1/2 tablet 250 mg po at supper Patient taking differently: Take 750 mg by mouth daily. Take one tablet 500mg  po in the morning and 1/2 tablet 250 mg po at supper 12/02/22   Shon Hale, MD  Multiple Vitamins-Minerals (PRESERVISION AREDS 2) CAPS Take 1 capsule by mouth in the morning and at bedtime.    [provider]  Omega-3 Fatty Acids (FISH OIL) 1000 MG CAPS One capsule BID Patient taking differently: Take 1 capsule by mouth in the morning and at bedtime. 08/07/21   Babs Sciara, MD  pantoprazole (PROTONIX) 40 MG tablet Take 1 tablet (40 mg total) by mouth daily. 02/11/23   Babs Sciara, MD  prochlorperazine (COMPAZINE) 10 MG tablet Take 1 tablet (10 mg total) by mouth every 6 (six) hours as needed for nausea or vomiting. 04/06/23   Shon Hale, MD  Vitamin D, Cholecalciferol, 10 MCG (400 UNIT) TABS TAKE 1 TABLET BY MOUTH ONCE DAILY. 05/12/20   Babs Sciara, MD    Current Facility-Administered Medications  Medication Dose Route Frequency Provider Last Rate Last Admin   ALPRAZolam Prudy Feeler) tablet 0.25 mg  0.25 mg Oral TID PRN Sherryll Burger, Pratik D, DO       azithromycin (ZITHROMAX) 500 mg in sodium chloride 0.9 % 250 mL IVPB  500 mg Intravenous Q24H Buena Irish, MD       cefTRIAXone  (ROCEPHIN) 2 g in sodium chloride 0.9 % 100 mL IVPB  2 g Intravenous Q24H Buena Irish, MD 200 mL/hr at 04/08/23 0611 2 g at 04/08/23 4098   lactated ringers infusion   Intravenous Continuous Buena Irish, MD 100 mL/hr at 04/08/23 0848 New Bag at 04/08/23 0848   melatonin tablet 3  mg  3 mg Oral QHS PRN Buena Irish, MD   3 mg at 04/08/23 0239   ondansetron [facility]) tablet 4 mg  4 mg Oral Q6H PRN Buena Irish, MD       Or   ondansetron [facility]) injection 4 mg  4 mg Intravenous Q6H PRN Buena Irish, MD        Allergies as of 04/07/2023 - Review Complete 04/07/2023  Allergen Reaction Noted   Hydrocortisone Itching 04/02/2020   Lisinopril  02/24/2016   Phenergan [promethazine hcl] Other (See Comments) 08/29/2012   Statins Other (See Comments) 08/29/2012   Trazodone and nefazodone Cough 10/02/2016   Prednisone Rash 02/15/2011    Family History  Problem Relation Age of Onset   Cancer Mother        ovaian   Hypertension Father        kidney   Stroke Father    Cancer Father    Hypertension Sister    Diabetes Brother     Social History   Socioeconomic History   Marital status: Widowed    Spouse name: Not on file   Number of children: Not on file   Years of education: Not on file   Highest education level: Not on file  Occupational History   Not on file  Tobacco Use   Smoking status: Never   Smokeless tobacco: Never  Vaping Use   Vaping status: Never Used  Substance and Sexual Activity   Alcohol use: No   Drug use: No   Sexual activity: Never    Birth control/protection: Abstinence  Other Topics Concern   Not on file  Social History Narrative   Not on file   Social Determinants of Health   Financial Resource Strain: Low Risk  (09/06/2022)   Overall Financial Resource Strain (CARDIA)    Difficulty of Paying Living Expenses: Not hard at all  Food Insecurity: No Food Insecurity (04/08/2023)   Hunger Vital Sign    Worried About Running Out of  Food in the Last Year: Never true    Ran Out of Food in the Last Year: Never true  Transportation Needs: No Transportation Needs (04/08/2023)   PRAPARE - Administrator, Civil Service (Medical): No    Lack of Transportation (Non-Medical): No  Physical Activity: Sufficiently Active (09/06/2022)   Exercise Vital Sign    Days of Exercise per Week: 5 days    Minutes of Exercise per Session: 30 min  Stress: No Stress Concern Present (09/06/2022)   [employer] of Occupational Health - Occupational Stress Questionnaire    Feeling of Stress : Not at all  Social Connections: Moderately Isolated (09/06/2022)   Social Connection and Isolation Panel [NHANES]    Frequency of Communication with Friends and Family: More than three times a week    Frequency of Social Gatherings with Friends and Family: More than three times a week    Attends Religious Services: 1 to 4 times per year    Active Member of [employer] or Organizations: No    Attends Banker Meetings: Never    Marital Status: Widowed  Intimate Partner Violence: Not At Risk (04/08/2023)   Humiliation, Afraid, Rape, and Kick questionnaire    Fear of Current or Ex-Partner: No    Emotionally Abused: No    Physically Abused: No    Sexually Abused: No    Review of Systems: Gen: Denies fever, chills.  CV: Denies chest pain, heart palpitations. Resp: Reports is a little more  difficult to get a full deep breath within normal.  Also with cough. GI: See GU : Denies urinary burning, urinary frequency, urinary incontinence.  MS: Denies joint pain. Derm: Denies rash. Psych: Denies depression, anxiety. Heme: See HPI  Physical Exam: Vital signs in last 24 hours: Temp:  [97.6 F (36.4 C)-98.7 F (37.1 C)] 97.8 F (36.6 C) (10/08 0857) Pulse Rate:  [63-81] 73 (10/08 0857) Resp:  [13-26] 18 (10/08 0637) BP: (112-186)/(53-142) 179/69 (10/08 0857) SpO2:  [88 %-99 %] 97 % (10/08 0857) Weight:  [72.6 kg] 72.6 kg (10/08  0637) Last BM Date : 04/04/23 General:   Alert,  Well-developed, well-nourished, pleasant and cooperative in NAD Head:  Normocephalic and atraumatic. Eyes:  Sclera clear, no icterus.   Conjunctiva pink. Ears:  Normal auditory acuity. Nose:  No deformity, discharge,  or lesions. Mouth:  No deformity or lesions, dentition normal. Neck:  Supple; no masses or thyromegaly. Lungs:  Clear throughout to auscultation.   No wheezes, crackles, or rhonchi. No acute distress. Heart:  Regular rate and rhythm; no murmurs, clicks, rubs,  or gallops. Abdomen:  Soft, nontender and nondistended. No masses, hepatosplenomegaly or hernias noted. Normal bowel sounds, without guarding, and without rebound.   Rectal:  Deferred until time of colonoscopy.   Msk:  Symmetrical without gross deformities. Normal posture. Pulses:  Normal pulses noted. Extremities:  Without clubbing or edema. Neurologic:  Alert and  oriented x4;  grossly normal neurologically. Skin:  Intact without significant lesions or rashes. Cervical Nodes:  No significant cervical adenopathy. Psych:  Alert and cooperative. Normal mood and affect.  Intake/Output from previous day: 10/07 0701 - 10/08 0700 In: -  Out: 250 [Urine:250] Intake/Output this shift: No intake/output data recorded.  Lab Results: Recent Labs    04/06/23 0940 04/07/23 1455  WBC 9.6 7.2  HGB 12.4 11.7*  HCT 36.6 35.8*  PLT 249 245   BMET Recent Labs    04/06/23 0940 04/07/23 1455 04/08/23 0247  NA 126* 126* 133*  K 4.1 3.9 3.7  CL 89* 90* 96*  CO2 29 28 27   GLUCOSE 145* 107* 117*  BUN 14 16 12   CREATININE 0.71 0.71 0.57  CALCIUM 8.8* 8.6* 8.8*   LFT Recent Labs    04/07/23 1455 04/08/23 0247  PROT 6.3* 6.0*  ALBUMIN 3.5 3.3*  AST 17 17  ALT 15 14  ALKPHOS 68 61  BILITOT 0.6 1.0    Studies/Results: DG Chest Portable 1 View  Result Date: 04/07/2023 CLINICAL DATA:  Shortness of breath EXAM: PORTABLE CHEST 1 VIEW COMPARISON:  Chest x-ray dated  April 06, 2023 FINDINGS: Patient rotation to the right somewhat limits evaluation. Cardiac and mediastinal contours are within normal limits when accounting for patient rotation. Increased right greater than left basilar opacities. No large pleural effusion or evidence of pneumothorax. IMPRESSION: Increased right greater than left basilar opacities, concerning for infection or aspiration. Recommend follow-up PA and lateral chest radiograph in 6-8 weeks to ensure resolution. Electronically Signed   By: Allegra Lai M.D.   On: 04/07/2023 15:55    Impression: 89 y.o. female with medical history significant for type 2 diabetes, HTN, HLD, hemicolectomy in 2011 following colonic perforation in the setting of diverticulitis, knee replacement in 2023, previously doing very well walking a mile a day, but for the last 3 months has had progressive decline in the setting of intermittent diarrhea, postprandial abdominal pain, nausea, poor appetite, and progressive weakness.  She has had multiple ER visits.  Usually hyponatremia  has been found, multiple UTIs.  She return to the emergency room yesterday, 10/7, with complaint of progressive weakness and poor appetite. She was found to have pneumonia and has been started on IV antibiotics, hyponatremia started on IV fluids, and GI consulted for further evaluation of abdominal pain and poor p.o. intake.  Abdominal pain/nausea: 3 months of postprandial abdominal burning, nausea, belching leading to decreased p.o. intake and subsequent weight loss.  No significant laboratory abnormalities have been found aside from recurrent hyponatremia likely related to poor p.o. intake.  LFTs have been normal.  CT angio A/P 03/07/2023 with high-grade stenosis at the origin of IMA, 50% stenosis of celiac artery, and patent SMA.  Based on these findings, I would not think her symptoms are related to chronic mesenteric ischemia as this typically requires significant stenosis involving 2 out of  3 mesenteric arteries.  At this point, I recommend PPI BID and EGD to rule out PUD, H. pylori, gastritis, duodenitis, malignancy.  If no significant findings on EGD, can consider having IR weigh on possibility of mesenteric ischemia.   Timing of EGD will depend on improvement in respiratory status from a pneumonia standpoint.  She is currently on 1.5 L nasal cannula, not on supplemental O2 at baseline.  Would prefer patient to be off of oxygen if possible prior to EGD.  Diarrhea: Previously with Sapovirus detected on GI path panel in June 2024.  Symptoms resolved spontaneously, but she has been left with intermittent diarrhea alternating with constipation in the setting of imodium for diarrhea. I suspect imodium is too strong in this case likely creating this alternating bowel cycle. Dicyclomine also listed on her med list though it is not clear that she is taking. At this point, I recommend holding dicyclomine and Imodium and monitoring for recurrent, persistent diarrhea. I have also recommend starting benefiber daily at discharge to help with bowel regularity.  Plan: Clear liquid diet today. NPO at midnight.  Re-evaluate in the morning for upper endoscopy with propofol by Dr. Jena Gauss. The risks, benefits, and alternatives have been discussed with the patient and her son in detail. The patient/son states understanding and desires to proceed.  This will depend on pulmonary status in the setting of pneumonia.  Start pantoprazole 40 mg BID If EGD unremarkable, consider having IR weigh in about possible mesenteric ischemia.  Hold imodium and dicyclomine and monitor for recurrent, persistent diarrhea.  Recommend starting benefiber 3 teaspoons daily x 2 weeks then increase to twice daily at discharge to help with bowel regularity.    LOS: 1 day    04/08/2023, 10:33 AM   Ermalinda Memos, PA-C [facility]

## 2023-04-08 NOTE — Consult Note (Signed)
Consultation Note Date: 04/08/2023   Patient Name: [name]  DOB: 07/24/1931  MRN: [id number]  Age / Sex: 89 y.o., female  PCP: Babs Sciara, MD Referring Physician: Erick Blinks, DO  Reason for Consultation: Establishing goals of care  HPI/Patient Profile: 89 y.o. 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   Clinical Assessment and Goals of Care: I have reviewed medical records including EPIC notes, labs and imaging, received report from RN, assessed the patient.  Mrs. Vantassel is sitting up quietly in bed.  She greets me, making and somewhat keeping eye contact.  Gastrointestinal PA is present at bedside who completes her evaluation and leaves.  Mrs. Bustamante is alert and oriented, able to make her needs known.  There is no family at bedside at this time.  We meet at the bedside to discuss diagnosis prognosis, GOC, EOL wishes, disposition and options.  I introduced Palliative Medicine as specialized medical care for people living with serious illness. It focuses on providing relief from the symptoms and stress of a serious illness. The goal is to improve quality of life for both the patient and the family.  We discussed a brief life review of the patient.  Mrs. Rakestraw tells me that her husband died at age 67.  She states that they have a son Barbara Cower whose wife Alvis Lemmings is going through cancer treatments at this time.  Mrs. Spangenberg  has been at [facility] for 4 years as of August of this year.  We then focused on their current illness.  We talk about further testing and consults, time for outcomes.  I share that it is not uncommon for the first 24 to 48 hours of hospital stay to be used in information gathering and testing.  Overall, Mrs. Thomasenia Sales is agreeable to further testing and consults.  The natural disease trajectory and expectations at EOL were discussed.  I attempted to elicit values and goals of care important to the patient.  Mrs. Jasper states several times during our conversation, "this is no way to live".  I encouraged her to allow time for the medical team to do testing.  I shared that there may be some reversible or treatable cause.   Advanced directives, concepts specific to code status, artifical feeding and hydration, and rehospitalization were considered and discussed.  DNR noted.  Discussed the importance of continued conversation with family and the medical providers regarding overall plan of care and treatment options, ensuring decisions are within the context of the patient's values and GOCs. Questions and concerns were addressed.  The patient was  encouraged to call with questions or concerns.  PMT will continue to support holistically.  Conference with attending, bedside nursing staff, transition of care team related to patient condition, needs, goals of care, disposition.   HCPOA  NEXT OF KIN -son, Caisha Kufahl.      SUMMARY OF RECOMMENDATIONS   At this point continue to treat the treatable but no CPR or intubation Time for testing and consult recommendations PMT to follow   Code Status/Advance Care Planning: DNR  Symptom Management:  Per hospitalist, no additional needs at this time.  Palliative Prophylaxis:  Frequent Pain Assessment and Oral Care  Additional Recommendations (Limitations, Scope, Preferences): Continue to treat but no CPR or intubation  Psycho-social/Spiritual:   Desire for further Chaplaincy support:no Additional Recommendations: Caregiving  Support/Resources  Prognosis:  Unable to determine, based on outcomes.  Guarded at this point.  Discharge Planning: To Be Determined      Primary Diagnoses: Present on Admission:  Mesenteric artery stenosis (HCC)   I have reviewed the medical record, interviewed the patient and family, and examined the patient. The following aspects are pertinent.  Past Medical History:  Diagnosis Date   Blood transfusion without reported diagnosis    Cognitive dysfunction 03/04/2019   Patient scores 21 out of 30 on [place] cognitive assessment September 2020   Diabetes mellitus without complication (HCC)    Diabetic peripheral neuropathy associated with type 2 diabetes mellitus (HCC) 02/25/2022   Diverticulitis    Frailty 03/04/2019   H/O bilateral breast reduction surgery    Hyperlipidemia    a. intolerant to statins.    Hypertension    Social History   Socioeconomic History   Marital status: Widowed    Spouse name: Not on file   Number of children: Not on file   Years of education: Not on file   Highest education level: Not on file  Occupational History   Not on file  Tobacco Use   Smoking status: Never   Smokeless tobacco: Never  Vaping Use   Vaping status: Never Used  Substance and Sexual Activity   Alcohol use: No   Drug use: No   Sexual activity: Never    Birth control/protection: Abstinence  Other Topics Concern   Not on file  Social History Narrative   Not on file   Social Determinants of Health   Financial Resource Strain: Low Risk  (09/06/2022)   Overall Financial Resource Strain (CARDIA)    Difficulty of Paying Living Expenses: Not hard at all  Food Insecurity: No Food Insecurity (04/08/2023)   Hunger Vital Sign    Worried About Running Out of Food in the Last Year: Never true    Ran Out of Food in the Last Year: Never true  Transportation Needs: No Transportation Needs  (04/08/2023)   PRAPARE - Administrator, Civil Service (Medical): No    Lack of Transportation (Non-Medical): No  Physical Activity: Sufficiently Active (09/06/2022)   Exercise Vital Sign    Days of Exercise per Week: 5 days    Minutes of Exercise per Session: 30 min  Stress: No Stress Concern Present (09/06/2022)   [employer] of Occupational Health - Occupational Stress Questionnaire    Feeling of Stress : Not at all  Social Connections: Moderately Isolated (09/06/2022)   Social Connection and Isolation Panel [NHANES]    Frequency of Communication with Friends and Family: More than three times a week    Frequency of Social Gatherings with Friends and Family: More than  three times a week    Attends Religious Services: 1 to 4 times per year    Active Member of Clubs or Organizations: No    Attends Banker Meetings: Never    Marital Status: Widowed   Family History  Problem Relation Age of Onset   Cancer Mother        ovaian   Hypertension Father        kidney   Stroke Father    Cancer Father    Hypertension Sister    Diabetes Brother    Scheduled Meds: Continuous Infusions:  azithromycin     cefTRIAXone (ROCEPHIN)  IV 2 g (04/08/23 0611)   PRN Meds:.ALPRAZolam, melatonin, ondansetron **OR** ondansetron (ZOFRAN) IV Medications Prior to Admission:  Prior to Admission medications   Medication Sig Start Date End Date Taking? Authorizing Provider  acetaminophen (PAIN RELIEF EXTRA STRENGTH) 500 MG tablet TAKE (2) TABLETS BY MOUTH EVERY SIX HOURS A NEEDED FOR PAIN. Patient taking differently: Take 1,000 mg by mouth every 6 (six) hours as needed for moderate pain. 01/15/23   Babs Sciara, MD  ALPRAZolam Prudy Feeler) 0.25 MG tablet Take 1 tablet (0.25 mg total) by mouth at bedtime as needed for sleep. 03/26/23   Babs Sciara, MD  amLODipine (NORVASC) 10 MG tablet Take 1 tablet (10 mg total) by mouth daily. 12/02/22   Emokpae, Courage, MD  benzonatate (TESSALON)  100 MG capsule TAKE (1) CAPSULE BY MOUTH 3 TIMES DAILY AS NEEDED FOR COUGH. 11/21/22   Babs Sciara, MD  BETA CAROTENE PROVITAMIN A 95284 units capsule Take 25,000 Units by mouth daily. 01/01/23   [provider]  beta carotene w/minerals (OCUVITE) tablet Take 1 tablet by mouth at bedtime. Patient taking differently: Take 1 tablet by mouth daily. 07/03/22   Babs Sciara, MD  BISACODYL 5 MG EC tablet TAKE 1 TABLET BY MOUTH ONCE DAILY AS NEEDED FOR MODERATE CONSTIPATION. Patient taking differently: Take 5 mg by mouth daily as needed for moderate constipation. 12/20/22   Babs Sciara, MD  Carboxymethylcellulose Sodium (THERATEARS) 0.25 % SOLN Apply 1 drop to eye in the morning and at bedtime.    [provider]  COMBIVENT RESPIMAT 20-100 MCG/ACT AERS respimat INHALE (1) PUFF INTO THE LUNGS EVERY SIX HOURS AS NEEDED FOR WHEEZING OR SHORTNESS OF BREATH. Patient taking differently: Inhale 1 puff into the lungs every 6 (six) hours as needed for wheezing or shortness of breath. 11/21/22   Babs Sciara, MD  cyanocobalamin 1000 MCG tablet Take 1,000 mcg by mouth at bedtime.    [provider]  dicyclomine (BENTYL) 10 MG capsule TAKE 1 CAPSULE BY MOUTH TWICE DAILY AS NEEDED FOR ABDOMINAL CRAMPS AND LOOSE STOOL. Patient taking differently: Take 10 mg by mouth 2 (two) times daily as needed (abdominal cramps and loose stools.). TAKE 1 CAPSULE BY MOUTH TWICE DAILY AS NEEDED FOR ABDOMINAL CRAMPS AND LOOSE STOOL. 11/21/22   Babs Sciara, MD  EASYMAX TEST test strip CHECK BLOOD SUGAR ONCE DAILY.(CALL MD IF BS BELOW 60: OR IF BS ABOVE 400) 02/03/23   Luking, [name], MD  gabapentin (NEURONTIN) 100 MG capsule 2 po bid Patient taking differently: Take 200 mg by mouth 2 (two) times daily. 11/19/22   Babs Sciara, MD  GOODSENSE HEMORRHOIDAL 0.25-14-74.9 % rectal ointment Place 1 Application rectally 3 (three) times daily as needed for irritation. 12/26/21   [provider]  Iron,  Ferrous Sulfate, 325 (65 Fe) MG TABS Take 325  mg by mouth every other day. 02/20/23   Babs Sciara, MD  loperamide (ANTI-DIARRHEAL) 2 MG tablet TAKE 1 TABLET BY MOUTH AS NEEDED FOR DIARRHEA OR LOOSE STOOL**MAX 4 TABLETS IN 24 HOURS** Patient taking differently: Take 2 mg by mouth 4 (four) times daily as needed for diarrhea or loose stools. 12/27/22   Babs Sciara, MD  loratadine (CLARITIN) 10 MG tablet Take 10 mg by mouth daily.    [provider]  losartan (COZAAR) 100 MG tablet Take 1 tablet daily Patient taking differently: Take 100 mg by mouth daily. 12/02/22   Shon Hale, MD  meclizine (ANTIVERT) 25 MG tablet Take 1 tablet (25 mg total) by mouth 3 (three) times daily as needed for dizziness or nausea. 01/24/23   Babs Sciara, MD  melatonin 3 MG TABS tablet Take 3 mg by mouth at bedtime.    [provider]  metFORMIN (GLUCOPHAGE) 500 MG tablet Take one tablet 500mg  po in the morning and 1/2 tablet 250 mg po at supper Patient taking differently: Take 750 mg by mouth daily. Take one tablet 500mg  po in the morning and 1/2 tablet 250 mg po at supper 12/02/22   Shon Hale, MD  Multiple Vitamins-Minerals (PRESERVISION AREDS 2) CAPS Take 1 capsule by mouth in the morning and at bedtime.    [provider]  Omega-3 Fatty Acids (FISH OIL) 1000 MG CAPS One capsule BID Patient taking differently: Take 1 capsule by mouth in the morning and at bedtime. 08/07/21   Babs Sciara, MD  pantoprazole (PROTONIX) 40 MG tablet Take 1 tablet (40 mg total) by mouth daily. 02/11/23   Babs Sciara, MD  prochlorperazine (COMPAZINE) 10 MG tablet Take 1 tablet (10 mg total) by mouth every 6 (six) hours as needed for nausea or vomiting. 04/06/23   Shon Hale, MD  Vitamin D, Cholecalciferol, 10 MCG (400 UNIT) TABS TAKE 1 TABLET BY MOUTH ONCE DAILY. 05/12/20   Babs Sciara, MD   Allergies  Allergen Reactions   Hydrocortisone Itching   Lisinopril     Other reaction(s):  Lethargy (intolerance)   Phenergan [Promethazine Hcl] Other (See Comments)    hallucinations   Statins Other (See Comments)    Severe myalgias   Trazodone And Nefazodone Cough   Prednisone Rash   Review of Systems  Unable to perform ROS: Age    Physical Exam Vitals and nursing note reviewed.  Constitutional:      General: She is not in acute distress. Cardiovascular:     Rate and Rhythm: Normal rate.  Pulmonary:     Effort: Pulmonary effort is normal. No tachypnea.  Skin:    General: Skin is warm and dry.  Neurological:     Mental Status: She is alert and oriented to person, place, and time.  Psychiatric:        Mood and Affect: Mood normal.        Behavior: Behavior normal.     Vital Signs: BP (!) 179/69   Pulse 73   Temp 97.8 F (36.6 C) (Oral)   Resp 18   Ht 5\' 8"  (1.727 m)   Wt 72.6 kg   SpO2 97%   BMI 24.34 kg/m  Pain Scale: 0-10   Pain Score: 0-No pain   SpO2: SpO2: 97 % O2 Device:SpO2: 97 % O2 Flow Rate: .O2 Flow Rate (L/min): 2 L/min  IO: Intake/output summary:  Intake/Output Summary (Last 24 hours) at 04/08/2023 1330 Last data filed at 04/08/2023 1159  Gross per 24 hour  Intake --  Output 825 ml  Net -825 ml    LBM: Last BM Date : 04/04/23 Baseline Weight: Weight: 72.6 kg Most recent weight: Weight: 72.6 kg     Palliative Assessment/Data:     Time In: 1040 Time Out: 1155 Time Total: 75 minutes  Greater than 50%  of this time was spent counseling and coordinating care related to the above assessment and plan.  Signed by: Katheran Awe, NP   Please contact Palliative Medicine Team phone at [phone] for questions and concerns.  For individual provider: See Loretha Stapler

## 2023-04-08 NOTE — ED Notes (Signed)
Pt had a whole bed change. Pt is resting at this time with call light in reach.

## 2023-04-08 NOTE — Progress Notes (Addendum)
PROGRESS NOTE    [name]  WUJ:[id number] DOB: [birth year] DOA: 04/07/2023 PCP: Babs Sciara, MD   Brief Narrative:    [name] is a vibrant 89 y.o. 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 knee replacement in 2023 who was walking a mile a day at the end of last year.  Earlier this year she was down to half a mile a day.  In the last 3 months the patient has been plagued with intermittent diarrhea, poor appetite, and progressive weakness.  She appears to have hyponatremia that is improving as well as community-acquired pneumonia seen on chest x-ray.  She is also noted to have increasing weight loss and malaise that may be related to mesenteric ischemia.  GI asked to evaluate for this condition to see if IR intervention could be beneficial.  Palliative also consulted for further goals of care discussion.  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 -Started on PPI and clear liquid diet with n.p.o. after midnight -Noted to have some postprandial burning sensation as well as 8 pound weight loss recently -May need IR evaluation if agreeable to intervention  Recurrent diarrhea-improved -Last bowel movement was this past Friday, continue to monitor -Start fiber supplementation and hold antimotility agents  Concern for failure to thrive -Appears to have poor long-term prognosis and does not appear to want aggressive measures -Appreciate palliative consultation    DVT prophylaxis:Lovenox Code Status: DNR Family Communication: None at bedside Disposition Plan:  Status is: Inpatient Remains inpatient appropriate because: Need for IV medications and inpt procedure  Consultants:  GI Palliative  Procedures:  None  Antimicrobials:  Anti-infectives (From admission, onward)    Start     Dose/Rate Route Frequency Ordered Stop   04/08/23 1600  azithromycin (ZITHROMAX) 500 mg in sodium chloride 0.9 % 250 mL IVPB        500 mg 250 mL/hr over 60 Minutes Intravenous Every 24 hours 04/07/23 2043     04/08/23 0530  cefTRIAXone (ROCEPHIN) 2 g in sodium chloride 0.9 % 100 mL IVPB        2 g 200 mL/hr over 30 Minutes Intravenous Every 24 hours 04/07/23 2042     04/07/23 1545  cefTRIAXone (ROCEPHIN) 1 g in sodium chloride 0.9 % 100 mL IVPB        1 g 200 mL/hr over 30 Minutes Intravenous  Once 04/07/23 1535 04/07/23 1618   04/07/23 1545  azithromycin (ZITHROMAX) 500 mg in sodium chloride 0.9 % 250 mL IVPB        500 mg 250 mL/hr over 60 Minutes Intravenous  Once 04/07/23 1535 04/07/23 1724       Subjective: Patient seen and evaluated today with mild abdominal pain and no nausea this am.  She continues to have significant weakness and malaise.  No acute concerns or events noted overnight.  Objective: Vitals:   04/08/23 0619 04/08/23 0637 04/08/23 0857 04/08/23 1509  BP: (!) 162/102 (!) 176/67 (!) 179/69 117/82  Pulse:  67 73 99  Resp:  18  Temp: 98.5 F (36.9 C) 98.7 F (37.1 C) 97.8 F (36.6 C)   TempSrc:  Oral Oral   SpO2:  98% 97% 97%  Weight:  72.6 kg    Height:  5\' 8"  (1.727 m)      Intake/Output Summary (Last 24 hours) at 04/08/2023 1523 Last data filed at 04/08/2023 1159 Gross per 24 hour  Intake --  Output 825 ml  Net -825 ml   Filed Weights   04/08/23 0637  Weight: 72.6 kg    Examination:  General exam: Appears calm and comfortable  Respiratory system: Clear to auscultation. Respiratory effort normal. Cardiovascular  system: S1 & S2 heard, RRR.  Gastrointestinal system: Abdomen is soft Central nervous system: Alert and awake Extremities: No edema Skin: No significant lesions noted Psychiatry: Flat affect.    Data Reviewed: I have personally reviewed following labs and imaging studies  CBC: Recent Labs  Lab 04/06/23 0940 04/07/23 1455  WBC 9.6 7.2  NEUTROABS 7.9* 4.7  HGB 12.4 11.7*  HCT 36.6 35.8*  MCV 88.0 88.8  PLT 249 245   Basic Metabolic Panel: Recent Labs  Lab 04/04/23 0850 04/06/23 0940 04/07/23 1455 04/08/23 0247  NA 131* 126* 126* 133*  K 5.0 4.1 3.9 3.7  CL 90* 89* 90* 96*  CO2 25 29 28 27   GLUCOSE 132* 145* 107* 117*  BUN 17 14 16 12   CREATININE 0.76 0.71 0.71 0.57  CALCIUM 9.8 8.8* 8.6* 8.8*   GFR: Estimated Creatinine Clearance: 48.1 mL/min (by C-G formula based on SCr of 0.57 mg/dL). Liver Function Tests: Recent Labs  Lab 04/07/23 1455 04/08/23 0247  AST 17 17  ALT 15 14  ALKPHOS 68 61  BILITOT 0.6 1.0  PROT 6.3* 6.0*  ALBUMIN 3.5 3.3*   No results for input(s): "LIPASE", "AMYLASE" in the last 168 hours. No results for input(s): "AMMONIA" in the last 168 hours. Coagulation Profile: No results for input(s): "INR", "PROTIME" in the last 168 hours. Cardiac Enzymes: No results for input(s): "CKTOTAL", "CKMB", "CKMBINDEX", "TROPONINI" in the last 168 hours. BNP (last 3 results) No results for input(s): "PROBNP" in the last 8760 hours. HbA1C: No results for input(s): "HGBA1C" in the last 72 hours. CBG: Recent Labs  Lab 04/08/23 0148 04/08/23 0747  GLUCAP 112* 129*   Lipid Profile: No results for input(s): "CHOL", "HDL", "LDLCALC", "TRIG", "CHOLHDL", "LDLDIRECT" in the last 72 hours. Thyroid Function Tests: No results for input(s): "TSH", "T4TOTAL", "FREET4", "T3FREE", "THYROIDAB" in the last 72 hours. Anemia Panel: No results for input(s): "VITAMINB12", "FOLATE", "FERRITIN", "TIBC", "IRON", "RETICCTPCT" in the last 72 hours. Sepsis Labs: No  results for input(s): "PROCALCITON", "LATICACIDVEN" in the last 168 hours.  Recent Results (from the past 240 hour(s))  Microscopic Examination     Status: Abnormal   Collection Time: 04/04/23  8:50 AM  Result Value Ref Range Status   WBC, UA 6-10 (A) 0 - 5 /hpf Final   RBC, Urine 0-2 0 - 2 /hpf Final   Epithelial Cells (non renal) 0-10 0 - 10 /hpf Final   Casts None seen None seen /lpf Final   Bacteria, UA None seen None seen/Few Final  Urine Culture, Reflex     Status: None   Collection Time: 04/04/23  8:50 AM  Result Value Ref Range Status   Urine Culture, Routine Final report  Final   Organism ID, Bacteria Comment  Final    Comment: Mixed urogenital flora Less than 10,000 colonies/mL   SARS Coronavirus 2 by RT PCR (  hospital order, performed in [facility] hospital lab) *cepheid single result test* Anterior Nasal Swab     Status: None   Collection Time: 04/06/23  9:00 AM   Specimen: Anterior Nasal Swab  Result Value Ref Range Status   SARS Coronavirus 2 by RT PCR NEGATIVE NEGATIVE Final    Comment: (NOTE) SARS-CoV-2 target nucleic acids are NOT DETECTED.  The SARS-CoV-2 RNA is generally detectable in upper and lower respiratory specimens during the acute phase of infection. The lowest concentration of SARS-CoV-2 viral copies this assay can detect is 250 copies / mL. A negative result does not preclude SARS-CoV-2 infection and should not be used as the sole basis for treatment or other patient management decisions.  A negative result may occur with improper specimen collection / handling, submission of specimen other than nasopharyngeal swab, presence of viral mutation(s) within the areas targeted by this assay, and inadequate number of viral copies (<250 copies / mL). A negative result must be combined with clinical observations, patient history, and epidemiological information.  Fact Sheet for Patients:   RoadLapTop.co.za  Fact Sheet for  Healthcare Providers: http://kim-miller.com/  This test is not yet approved or  cleared by the [place] FDA and has been authorized for detection and/or diagnosis of SARS-CoV-2 by FDA under an Emergency Use Authorization (EUA).  This EUA will remain in effect (meaning this test can be used) for the duration of the COVID-19 declaration under Section 564(b)(1) of the Act, 21 U.S.C. section 360bbb-3(b)(1), unless the authorization is terminated or revoked sooner.  Performed at [facility], [address] [place], [place] [address]   SARS Coronavirus 2 by RT PCR (hospital order, performed in [facility] hospital lab) *cepheid single result test*     Status: None   Collection Time: 04/07/23  9:09 PM  Result Value Ref Range Status   SARS Coronavirus 2 by RT PCR NEGATIVE NEGATIVE Final    Comment: (NOTE) SARS-CoV-2 target nucleic acids are NOT DETECTED.  The SARS-CoV-2 RNA is generally detectable in upper and lower respiratory specimens during the acute phase of infection. The lowest concentration of SARS-CoV-2 viral copies this assay can detect is 250 copies / mL. A negative result does not preclude SARS-CoV-2 infection and should not be used as the sole basis for treatment or other patient management decisions.  A negative result may occur with improper specimen collection / handling, submission of specimen other than nasopharyngeal swab, presence of viral mutation(s) within the areas targeted by this assay, and inadequate number of viral copies (<250 copies / mL). A negative result must be combined with clinical observations, patient history, and epidemiological information.  Fact Sheet for Patients:   RoadLapTop.co.za  Fact Sheet for Healthcare Providers: http://kim-miller.com/  This test is not yet approved or  cleared by the [place] FDA and has been authorized for detection and/or diagnosis of SARS-CoV-2  by FDA under an Emergency Use Authorization (EUA).  This EUA will remain in effect (meaning this test can be used) for the duration of the COVID-19 declaration under Section 564(b)(1) of the Act, 21 U.S.C. section 360bbb-3(b)(1), unless the authorization is terminated or revoked sooner.  Performed at [facility], [address] [place], [place] [address]   MRSA Next Gen by PCR, Nasal     Status: None   Collection Time: 04/08/23  7:00 AM   Specimen: Nasal Mucosa; Nasal Swab  Result Value Ref Range Status   MRSA by PCR Next Gen NOT DETECTED NOT DETECTED Final  Comment: (NOTE) The GeneXpert MRSA Assay (FDA approved for NASAL specimens only), is one component of a comprehensive MRSA colonization surveillance program. It is not intended to diagnose MRSA infection nor to guide or monitor treatment for MRSA infections. Test performance is not FDA approved in patients less than 52 years old. Performed at [facility], [address] [place], [place] [address]          Radiology Studies: DG Chest Portable 1 View  Result Date: 04/07/2023 CLINICAL DATA:  Shortness of breath EXAM: PORTABLE CHEST 1 VIEW COMPARISON:  Chest x-ray dated April 06, 2023 FINDINGS: Patient rotation to the right somewhat limits evaluation. Cardiac and mediastinal contours are within normal limits when accounting for patient rotation. Increased right greater than left basilar opacities. No large pleural effusion or evidence of pneumothorax. IMPRESSION: Increased right greater than left basilar opacities, concerning for infection or aspiration. Recommend follow-up PA and lateral chest radiograph in 6-8 weeks to ensure resolution. Electronically Signed   By: Allegra Lai M.D.   On: 04/07/2023 15:55        Scheduled Meds:  enoxaparin (LOVENOX) injection  40 mg Subcutaneous Q24H   pantoprazole  40 mg Oral BID AC   Continuous Infusions:  azithromycin     cefTRIAXone (ROCEPHIN)  IV 2 g (04/08/23 2956)      LOS: 1 day    Time spent: 35 minutes    Natalee Tomkiewicz Hoover Brunette, DO [facility]  If 7PM-7AM, please contact night-coverage [email or web address] 04/08/2023, 3:23 PM

## 2023-04-08 NOTE — ED Notes (Signed)
Pt says she is from [facility]. Says she has been declining health wise over last 3 months. Usually ambulatory, alert, and oriented. She continues saying she has not been as mobile as usual and now requires assistance.   2 known sick residents at facility but no known contact.   Says she was seen yesterday for shob but discharged home. Presented again with worsening symptoms and found at 88% room air. Improvement in vitals when placed on 2L nasal cannula.

## 2023-04-08 NOTE — TOC Initial Note (Signed)
Transition of Care [facility]) - Initial/Assessment Note    Patient Details  Name: [name] MRN: [id number] Date of Birth: [birth year]  Transition of Care [facility]) CM/SW Contact:    Leitha Bleak, RN Phone Number: 04/08/2023, 2:15 PM  Clinical Narrative:         Patient is from [facility] for 4 years. Admitted with mesenteric artery stenosis.   Patient has declined, palliative consulted, Patient is guarded, continue with treat the treatable. No surgery expected. FL2 started for plan to return to ALF. TOC following.         Expected Discharge Plan: Assisted Living Barriers to Discharge: Continued Medical Work up   Patient Goals and CMS Choice Patient states their goals for this hospitalization and ongoing recovery are:: Return to ALF CMS Medicare.gov Compare Post Acute Care list provided to:: Patient Choice offered to / list presented to : Patient      Expected Discharge Plan and Services     Living arrangements for the past 2 months:  (ALF)                   Prior Living Arrangements/Services Living arrangements for the past 2 months:  (ALF) Lives with:: Facility Resident           Activities of Daily Living   ADL Screening (condition at time of admission) Independently performs ADLs?: No Does the patient have a NEW difficulty with bathing/dressing/toileting/self-feeding that is expected to last >3 days?: No Does the patient have a NEW difficulty with getting in/out of bed, walking, or climbing stairs that is expected to last >3 days?: No Does the patient have a NEW difficulty with communication that is expected to last >3 days?: No Is the patient deaf or have difficulty hearing?: No Does the patient have difficulty seeing, even when wearing glasses/contacts?: No Does the patient have difficulty concentrating, remembering, or making decisions?: No  Permission Sought/Granted   Emotional Assessment       Orientation: : Oriented to Self, Oriented to Place,  Oriented to  Time, Oriented to Situation Alcohol / Substance Use: Not Applicable Psych Involvement: No (comment)  Admission diagnosis:  Hyponatremia [E87.1] Mesenteric artery stenosis (HCC) [K55.1] Acute respiratory failure with hypoxia (HCC) [J96.01] Pneumonia of both lower lobes due to infectious organism [J18.9] Patient Active Problem List   Diagnosis Date Noted   Mesenteric artery stenosis (HCC) 04/07/2023   Type 2 diabetes mellitus (HCC) 03/10/2023   Acute lower UTI (urinary tract infection) 03/10/2023   Dehydration 12/01/2022   Generalized weakness 11/30/2022   Acute diarrhea 11/30/2022   Hypomagnesemia 11/30/2022   Hypertensive urgency 11/30/2022   Upper respiratory tract infection 11/30/2022   Diabetic neuropathy (HCC) 11/30/2022   S/P total knee arthroplasty, right 04/12/2022   Hypertension associated with type 2 diabetes mellitus (HCC) 02/25/2022   Aortic atherosclerosis (HCC) 02/25/2022   Chronic non-seasonal allergic rhinitis 02/25/2022   Diabetic peripheral neuropathy associated with type 2 diabetes mellitus (HCC) 02/25/2022   Chronic anxiety 02/25/2022   Pneumonia 02/17/2022   GERD (gastroesophageal reflux disease) 02/17/2022   Myalgia due to statin 02/15/2020   Cognitive dysfunction 03/04/2019   Generalized anxiety disorder 06/05/2018   Floaters in visual field, bilateral 03/23/2018   Living in assisted living 03/23/2018   Hyponatremia 03/08/2018   C. difficile diarrhea 03/03/2018   Osteoarthritis of right knee 12/02/2017   Closed fracture of left orbital floor with routine healing 01/17/2017   Hyperlipidemia associated with type 2 diabetes mellitus (HCC) 10/02/2016   Pedal  edema 10/02/2016   Insomnia 10/02/2016   SBO (small bowel obstruction) (HCC) 07/02/2016   Dyspnea on exertion 02/24/2016   Other fatigue    Leukocytosis 12/16/2014   Essential hypertension 12/15/2014   Type 2 diabetes mellitus with atherosclerosis of aorta (HCC) 02/15/2011   PCP:   Babs Sciara, MD Pharmacy:   [facility] [place] - [address] [place] [address] Phone: [phone] Fax: [phone]     Social Determinants of Health (SDOH) Social History: SDOH Screenings   Food Insecurity: No Food Insecurity (04/08/2023)  Housing: Patient Declined (04/08/2023)  Transportation Needs: No Transportation Needs (04/08/2023)  Utilities: Not At Risk (04/08/2023)  Alcohol Screen: Low Risk  (09/06/2022)  Depression (PHQ2-9): Low Risk  (01/08/2023)  Financial Resource Strain: Low Risk  (09/06/2022)  Physical Activity: Sufficiently Active (09/06/2022)  Social Connections: Moderately Isolated (09/06/2022)  Stress: No Stress Concern Present (09/06/2022)  Tobacco Use: Low Risk  (04/08/2023)   SDOH Interventions:    Readmission Risk Interventions    04/08/2023    2:11 PM  Readmission Risk Prevention Plan  Transportation Screening Complete  PCP or Specialist Appt within 3-5 Days Not Complete  HRI or Home Care Consult Complete  Social Work Consult for Recovery Care Planning/Counseling Complete  Palliative Care Screening Not Applicable  Medication Review Oceanographer) Complete

## 2023-04-08 NOTE — Progress Notes (Signed)
Mobility Specialist Progress Note:    04/08/23 1300  Mobility  Activity Dangled on edge of bed  Level of Assistance Standby assist, set-up cues, supervision of patient - no hands on  Assistive Device None  Range of Motion/Exercises Active;All extremities  Activity Response Tolerated well  Mobility Referral Yes  $Mobility charge 1 Mobility  Mobility Specialist Start Time (ACUTE ONLY) 1300  Mobility Specialist Stop Time (ACUTE ONLY) 1315  Mobility Specialist Time Calculation (min) (ACUTE ONLY) 15 min   Pt received in bed, agreeable to mobility. Required SBA to sit EOB with no AD. Tolerated well, c/o coughing. SpO2 97% on 2L. Pt refused ambulation d/t weakness. Returned pt supine, all needs met.   Lawerance Bach Mobility Specialist Please contact via Special educational needs teacher or  Rehab office at [phone]

## 2023-04-09 ENCOUNTER — Ambulatory Visit: Payer: Medicare Other | Admitting: Family Medicine

## 2023-04-09 DIAGNOSIS — R197 Diarrhea, unspecified: Secondary | ICD-10-CM | POA: Diagnosis not present

## 2023-04-09 DIAGNOSIS — J189 Pneumonia, unspecified organism: Secondary | ICD-10-CM

## 2023-04-09 DIAGNOSIS — R110 Nausea: Secondary | ICD-10-CM | POA: Diagnosis not present

## 2023-04-09 DIAGNOSIS — R109 Unspecified abdominal pain: Secondary | ICD-10-CM | POA: Diagnosis not present

## 2023-04-09 DIAGNOSIS — K551 Chronic vascular disorders of intestine: Secondary | ICD-10-CM | POA: Diagnosis not present

## 2023-04-09 LAB — COMPREHENSIVE METABOLIC PANEL
ALT: 14 U/L (ref 0–44)
AST: 13 U/L — ABNORMAL LOW (ref 15–41)
Albumin: 3.1 g/dL — ABNORMAL LOW (ref 3.5–5.0)
Alkaline Phosphatase: 58 U/L (ref 38–126)
Anion gap: 10 (ref 5–15)
BUN: 16 mg/dL (ref 8–23)
CO2: 29 mmol/L (ref 22–32)
Calcium: 9 mg/dL (ref 8.9–10.3)
Chloride: 94 mmol/L — ABNORMAL LOW (ref 98–111)
Creatinine, Ser: 0.8 mg/dL (ref 0.44–1.00)
GFR, Estimated: >60 mL/min (ref >60)
Glucose, Bld: 128 mg/dL — ABNORMAL HIGH (ref 70–99)
Potassium: 3.5 mmol/L (ref 3.5–5.1)
Sodium: 133 mmol/L — ABNORMAL LOW (ref 135–145)
Total Bilirubin: 0.8 mg/dL (ref 0.3–1.2)
Total Protein: 6 g/dL — ABNORMAL LOW (ref 6.5–8.1)

## 2023-04-09 LAB — CBC
HCT: 35.4 % — ABNORMAL LOW (ref 36.0–46.0)
Hemoglobin: 11.3 g/dL — ABNORMAL LOW (ref 12.0–15.0)
MCH: 28.8 pg (ref 26.0–34.0)
MCHC: 31.9 g/dL (ref 30.0–36.0)
MCV: 90.3 fL (ref 80.0–100.0)
Platelets: 275 10*3/uL (ref 150–400)
RBC: 3.92 MIL/uL (ref 3.87–5.11)
RDW: 13.9 % (ref 11.5–15.5)
WBC: 8.8 10*3/uL (ref 4.0–10.5)
nRBC: 0 % (ref 0.0–0.2)

## 2023-04-09 LAB — GLUCOSE, CAPILLARY
Glucose-Capillary: 138 mg/dL — ABNORMAL HIGH (ref 70–99)
Glucose-Capillary: 140 mg/dL — ABNORMAL HIGH (ref 70–99)
Glucose-Capillary: 149 mg/dL — ABNORMAL HIGH (ref 70–99)
Glucose-Capillary: 148 mg/dL — ABNORMAL HIGH (ref 70–99)

## 2023-04-09 LAB — MAGNESIUM: Magnesium: 1.7 mg/dL (ref 1.7–2.4)

## 2023-04-09 MED ORDER — MUSCLE RUB 10-15 % EX CREA
TOPICAL_CREAM | CUTANEOUS | Status: DC | PRN
  Administered 2023-04-09 – 2023-04-13 (×2): 1 via TOPICAL
  Filled 2023-04-09: qty 85

## 2023-04-09 MED ORDER — HYDRALAZINE HCL 20 MG/ML IJ SOLN
10.0000 mg | Freq: Four times a day (QID) | INTRAMUSCULAR | Status: DC | PRN
  Administered 2023-04-09 (×2): 10 mg via INTRAVENOUS
  Filled 2023-04-09 (×2): qty 1

## 2023-04-09 NOTE — Progress Notes (Signed)
Mobility Specialist Progress Note:    04/09/23 0930  Mobility  Activity Refused mobility   Pt refused mobility d/t cough and fatigue. All needs met.   [name] Mobility Specialist Please contact via Special educational needs teacher or  Rehab office at [phone]

## 2023-04-09 NOTE — Hospital Course (Addendum)
[name] is a vibrant 89 y.o. 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 knee replacement in 2023 who was walking a mile a day at the end of last year.  Earlier this year she was down to half a mile a day.  In the last 3 months the patient has been plagued with intermittent diarrhea, poor appetite, and progressive weakness.  She appears to have hyponatremia that is improving as well as community-acquired pneumonia seen on chest x-ray.  She is also noted to have increasing weight loss and malaise that may be related to mesenteric ischemia.  GI asked to evaluate for this condition to see if IR intervention could be beneficial.  Palliative also consulted for further goals of care discussion.

## 2023-04-09 NOTE — Progress Notes (Signed)
Subjective: Patient not feeling well today. Feeling very tired and weak. Denies nausea, vomiting or abdominal pain. Small BM last night with a hard stool ball, none this a.m. she is passing a lot of gas and having some belching but no burning in her chest.   She is notably on 2L O2 today vs. 1.5L yesterday   Objective: Vital signs in last 24 hours: Temp:  [98.4 F (36.9 C)-98.8 F (37.1 C)] 98.4 F (36.9 C) (10/09 0606) Pulse Rate:  [64-99] 85 (10/09 0731) Resp:  [18] 18 (10/09 0606) BP: (117-170)/(59-95) 153/95 (10/09 0731) SpO2:  [94 %-97 %] 94 % (10/09 0606) Last BM Date : 04/08/23 General:   Alert and oriented, pleasant Head:  Normocephalic and atraumatic. Eyes:  No icterus, sclera clear. Conjuctiva pink.  Mouth:  Without lesions, mucosa pink and moist.  Neck:  Supple, without thyromegaly or masses.  Heart:  S1, S2 present, no murmurs noted.  Lungs: rhonchi bilaterally   Abdomen:  Bowel sounds present, soft, non-tender, non-distended. No HSM or hernias noted. No rebound or guarding. No masses appreciated  Neurologic:  Alert and  oriented x4;  grossly normal neurologically. Skin:  Warm and dry, intact without significant lesions.  Psych:  Alert and cooperative. Normal mood and affect.   Lab Results: Recent Labs    04/06/23 0940 04/07/23 1455 04/09/23 0437  WBC 9.6 7.2 8.8  HGB 12.4 11.7* 11.3*  HCT 36.6 35.8* 35.4*  PLT 249 245 275   BMET Recent Labs    04/07/23 1455 04/08/23 0247 04/09/23 0437  NA 126* 133* 133*  K 3.9 3.7 3.5  CL 90* 96* 94*  CO2 28 27 29   GLUCOSE 107* 117* 128*  BUN 16 12 16   CREATININE 0.71 0.57 0.80  CALCIUM 8.6* 8.8* 9.0   LFT Recent Labs    04/07/23 1455 04/08/23 0247 04/09/23 0437  PROT 6.3* 6.0* 6.0*  ALBUMIN 3.5 3.3* 3.1*  AST 17 17 13*  ALT 15 14 14   ALKPHOS 68 61 58  BILITOT 0.6 1.0 0.8    Assessment: [name] is an 89 year old female who presented to the ED on Monday with progressive wakness and poor  appetite, found to have pneumonia, started on IV antibiotics, GI consulted for further evaluation of abdominal pain and poor PO intake.  Abdominal pain/nausea: 3 months of postprandial abdominal burning, nausea, belching much has led to decreased p.o. intake and weight loss.  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  Left suspicion for mesenteric ischemia.  She was started on PPI twice daily yesterday and recommended to have EGD for further evaluation.   Timing of EGD is pending patient's respiratory status due to pneumonia as she is now on supplemental oxygen.  Was on 1.5 L yesterday now on 2 L today, nursing staff is attempting to wean her O2 down.  Will need to hold off on EGD until respiratory status improves, will reassess in the a.m. and can hopefully proceed tomorrow, ideally off of supplemental O2.  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  Symptoms resolved spontaneously, she has had intermittent diarrhea alternating with constipation in setting of Imodium use.  Also unclear if she has been taking dicyclomine as well.  Our team recommended holding off on dicyclomine and Imodium to get an idea for what patient's baseline bowel habits are, and starting Benefiber  daily. Passed a hard stool ball last night, no BMs this morning    Plan: -Clear liquid diet, n.p.o. midnight -Reevaluate in the morning for EGD with Dr. Tasia Catchings, risk and benefits of procedure have been discussed with the patient previously -Continue PPI BID -If EGD unremarkable, consider IR weighing in about possible mesenteric ischemia. -Avoid use of Imodium and dicyclomine -Continue Benefiber 3 teaspoons daily x 2 weeks then increase to twice daily at discharge   LOS: 2 days    04/09/2023, 9:13 AM   Shakeyla Giebler L. Jeanmarie Hubert, MSN, APRN, AGNP-C Adult-Gerontology Nurse  Practitioner [facility]

## 2023-04-09 NOTE — H&P (View-Only) (Signed)
Subjective: Patient not feeling well today. Feeling very tired and weak. Denies nausea, vomiting or abdominal pain. Small BM last night with a hard stool ball, none this a.m. she is passing a lot of gas and having some belching but no burning in her chest.   She is notably on 2L O2 today vs. 1.5L yesterday   Objective: Vital signs in last 24 hours: Temp:  [98.4 F (36.9 C)-98.8 F (37.1 C)] 98.4 F (36.9 C) (10/09 0606) Pulse Rate:  [64-99] 85 (10/09 0731) Resp:  [18] 18 (10/09 0606) BP: (117-170)/(59-95) 153/95 (10/09 0731) SpO2:  [94 %-97 %] 94 % (10/09 0606) Last BM Date : 04/08/23 General:   Alert and oriented, pleasant Head:  Normocephalic and atraumatic. Eyes:  No icterus, sclera clear. Conjuctiva pink.  Mouth:  Without lesions, mucosa pink and moist.  Neck:  Supple, without thyromegaly or masses.  Heart:  S1, S2 present, no murmurs noted.  Lungs: rhonchi bilaterally   Abdomen:  Bowel sounds present, soft, non-tender, non-distended. No HSM or hernias noted. No rebound or guarding. No masses appreciated  Neurologic:  Alert and  oriented x4;  grossly normal neurologically. Skin:  Warm and dry, intact without significant lesions.  Psych:  Alert and cooperative. Normal mood and affect.   Lab Results: Recent Labs    04/06/23 0940 04/07/23 1455 04/09/23 0437  WBC 9.6 7.2 8.8  HGB 12.4 11.7* 11.3*  HCT 36.6 35.8* 35.4*  PLT 249 245 275   BMET Recent Labs    04/07/23 1455 04/08/23 0247 04/09/23 0437  NA 126* 133* 133*  K 3.9 3.7 3.5  CL 90* 96* 94*  CO2 28 27 29   GLUCOSE 107* 117* 128*  BUN 16 12 16   CREATININE 0.71 0.57 0.80  CALCIUM 8.6* 8.8* 9.0   LFT Recent Labs    04/07/23 1455 04/08/23 0247 04/09/23 0437  PROT 6.3* 6.0* 6.0*  ALBUMIN 3.5 3.3* 3.1*  AST 17 17 13*  ALT 15 14 14   ALKPHOS 68 61 58  BILITOT 0.6 1.0 0.8    Assessment: [name] is an 89 year old female who presented to the ED on Monday with progressive wakness and poor  appetite, found to have pneumonia, started on IV antibiotics, GI consulted for further evaluation of abdominal pain and poor PO intake.  Abdominal pain/nausea: 3 months of postprandial abdominal burning, nausea, belching much has led to decreased p.o. intake and weight loss.  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  Left suspicion for mesenteric ischemia.  She was started on PPI twice daily yesterday and recommended to have EGD for further evaluation.   Timing of EGD is pending patient's respiratory status due to pneumonia as she is now on supplemental oxygen.  Was on 1.5 L yesterday now on 2 L today, nursing staff is attempting to wean her O2 down.  Will need to hold off on EGD until respiratory status improves, will reassess in the a.m. and can hopefully proceed tomorrow, ideally off of supplemental O2.  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  Symptoms resolved spontaneously, she has had intermittent diarrhea alternating with constipation in setting of Imodium use.  Also unclear if she has been taking dicyclomine as well.  Our team recommended holding off on dicyclomine and Imodium to get an idea for what patient's baseline bowel habits are, and starting Benefiber  daily. Passed a hard stool ball last night, no BMs this morning    Plan: -Clear liquid diet, n.p.o. midnight -Reevaluate in the morning for EGD with Dr. Tasia Catchings, risk and benefits of procedure have been discussed with the patient previously -Continue PPI BID -If EGD unremarkable, consider IR weighing in about possible mesenteric ischemia. -Avoid use of Imodium and dicyclomine -Continue Benefiber 3 teaspoons daily x 2 weeks then increase to twice daily at discharge   LOS: 2 days    04/09/2023, 9:13 AM   Shakeyla Giebler L. Jeanmarie Hubert, MSN, APRN, AGNP-C Adult-Gerontology Nurse  Practitioner [facility]

## 2023-04-09 NOTE — Plan of Care (Signed)
  Problem: Clinical Measurements: Goal: Diagnostic test results will improve Outcome: Progressing Goal: Cardiovascular complication will be avoided Outcome: Progressing   Problem: Activity: Goal: Risk for activity intolerance will decrease Outcome: Progressing   Problem: Coping: Goal: Level of anxiety will decrease Outcome: Progressing   Problem: Pain Managment: Goal: General experience of comfort will improve Outcome: Progressing

## 2023-04-09 NOTE — Progress Notes (Signed)
PROGRESS NOTE    [name]  ZOX:[id number] DOB: 1932/01/22 DOA: 04/07/2023 PCP: Babs Sciara, MD   Brief Narrative:    [name] is a vibrant 89 y.o. 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 knee replacement in 2023 who was walking a mile a day at the end of last year.  Earlier this year she was down to half a mile a day.  In the last 3 months the patient has been plagued with intermittent diarrhea, poor appetite, and progressive weakness.  She appears to have hyponatremia that is improving as well as community-acquired pneumonia seen on chest x-ray.  She is also noted to have increasing weight loss and malaise that may be related to mesenteric ischemia.  GI asked to evaluate for this condition to see if IR intervention could be beneficial.  Palliative also consulted for further goals of care discussion.    Subjective:  The patient was seen and examined this morning, awake alert cooperative, but lethargic Complaining of significant generalized weaknesses Hemodynamically stable, hypertensive with BP of 153/95,, temp 98.4,   Assessment & Plan:   Principal Problem:   Mesenteric artery stenosis (HCC) Active Problems:   Abdominal pain, chronic, epigastric  Assessment and Plan:   Community-acquired pneumonia -No signs of SIRS /sepsis ruled out -Continue azithromycin and Rocephin -Legionella neg  Hyponatremia-improving Serum sodium level 126, 133, 133 -Continue to monitor in am; appears euvolemic -Urine Osm, Serum Osm, Urine Na, TSH ordered and pending -Follow in am  Concern for mesenteric ischemia with high-grade stenosis to IMA as well as 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 -Started on PPI  -Noted to have some postprandial burning sensation as well as 8 pound weight loss recently -May need IR evaluation if agreeable to intervention  Recurrent  diarrhea-resolved -Last bowel movement was this past Friday, continue to monitor -Start fiber supplementation and hold antimotility agents  Concern for failure to thrive -Appears to have poor long-term prognosis and does not appear to want aggressive measures -Appreciate palliative consultation   DVT prophylaxis:Lovenox Code Status: DNR Family Communication: None at bedside Disposition Plan:  Status is: Inpatient Remains inpatient appropriate because: Need for IV medications and inpt procedure  Consultants:  GI Palliative  Procedures:  None  Antimicrobials:  Anti-infectives (From admission, onward)    Start     Dose/Rate Route Frequency Ordered Stop   04/08/23 1600  azithromycin (ZITHROMAX) 500 mg in sodium chloride 0.9 % 250 mL IVPB        500 mg 250 mL/hr over 60 Minutes Intravenous Every 24 hours 04/07/23 2043     04/08/23 0530  cefTRIAXone (ROCEPHIN) 2 g in sodium chloride 0.9 % 100 mL IVPB        2 g 200 mL/hr over 30 Minutes Intravenous Every 24 hours 04/07/23 2042     04/07/23 1545  cefTRIAXone (ROCEPHIN) 1 g in sodium chloride 0.9 % 100 mL IVPB        1 g 200 mL/hr over 30 Minutes Intravenous  Once 04/07/23 1535 04/07/23 1618   04/07/23 1545  azithromycin (ZITHROMAX) 500 mg in sodium chloride 0.9 % 250 mL IVPB        500 mg 250 mL/hr over 60 Minutes Intravenous  Once 04/07/23 1535 04/07/23 1724        Objective: Vitals:   04/09/23 0606 04/09/23 0649 04/09/23 0731 04/09/23 0912  BP: (!) 170/75 (!) 170/75 Marland Kitchen)  153/95 (!) 174/58  Pulse: 64  85 83  Resp: 18   16  Temp: 98.4 F (36.9 C)     TempSrc: Oral     SpO2: 94%   97%  Weight:      Height:        Intake/Output Summary (Last 24 hours) at 04/09/2023 1400 Last data filed at 04/09/2023 0500 Gross per 24 hour  Intake 451.54 ml  Output 200 ml  Net 251.54 ml   Filed Weights   04/08/23 0637  Weight: 72.6 kg     General:  AAO x 3,  cooperative, no distress;   HEENT:  Normocephalic, PERRL, otherwise  with in Normal limits   Neuro:  CNII-XII intact. , normal motor and sensation, reflexes intact   Lungs:   Clear to auscultation BL, Respirations unlabored,  No wheezes / crackles  Cardio:    S1/S2, RRR, No murmure, No Rubs or Gallops   Abdomen:  Soft, non-tender, bowel sounds active all four quadrants, no guarding or peritoneal signs.  Muscular  skeletal:  Limited exam -global generalized weaknesses - in bed, able to move all 4 extremities,   2+ pulses,  symmetric, No pitting edema  Skin:  Dry, warm to touch, negative for any Rashes,  Wounds: Please see nursing documentation         Data Reviewed: I have personally reviewed following labs and imaging studies  CBC: Recent Labs  Lab 04/06/23 0940 04/07/23 1455 04/09/23 0437  WBC 9.6 7.2 8.8  NEUTROABS 7.9* 4.7  --   HGB 12.4 11.7* 11.3*  HCT 36.6 35.8* 35.4*  MCV 88.0 88.8 90.3  PLT 249 245 275   Basic Metabolic Panel: Recent Labs  Lab 04/04/23 0850 04/06/23 0940 04/07/23 1455 04/08/23 0247 04/09/23 0437  NA 131* 126* 126* 133* 133*  K 5.0 4.1 3.9 3.7 3.5  CL 90* 89* 90* 96* 94*  CO2 25 29 28 27 29   GLUCOSE 132* 145* 107* 117* 128*  BUN 17 14 16 12 16   CREATININE 0.76 0.71 0.71 0.57 0.80  CALCIUM 9.8 8.8* 8.6* 8.8* 9.0  MG  --   --   --   --  1.7   GFR: Estimated Creatinine Clearance: 48.1 mL/min (by C-G formula based on SCr of 0.8 mg/dL). Liver Function Tests: Recent Labs  Lab 04/07/23 1455 04/08/23 0247 04/09/23 0437  AST 17 17 13*  ALT 15 14 14   ALKPHOS 68 61 58  BILITOT 0.6 1.0 0.8  PROT 6.3* 6.0* 6.0*  ALBUMIN 3.5 3.3* 3.1*   CBG: Recent Labs  Lab 04/08/23 2020 04/08/23 2355 04/09/23 0603 04/09/23 0725 04/09/23 1101  GLUCAP 220* 143* 138* 140* 149*   Lipid Profile: No results for input(s): "CHOL", "HDL", "LDLCALC", "TRIG", "CHOLHDL", "LDLDIRECT" in the last 72 hours. Thyroid Function Tests: Recent Labs    04/08/23 1442  TSH 1.161   Anemia Panel:   Recent Results (from the past  240 hour(s))  Microscopic Examination     Status: Abnormal   Collection Time: 04/04/23  8:50 AM  Result Value Ref Range Status   WBC, UA 6-10 (A) 0 - 5 /hpf Final   RBC, Urine 0-2 0 - 2 /hpf Final   Epithelial Cells (non renal) 0-10 0 - 10 /hpf Final   Casts None seen None seen /lpf Final   Bacteria, UA None seen None seen/Few Final  Urine Culture, Reflex     Status: None   Collection Time: 04/04/23  8:50 AM  Result Value  Ref Range Status   Urine Culture, Routine Final report  Final   Organism ID, Bacteria Comment  Final    Comment: Mixed urogenital flora Less than 10,000 colonies/mL   SARS Coronavirus 2 by RT PCR (hospital order, performed in [facility] hospital lab) *cepheid single result test* Anterior Nasal Swab     Status: None   Collection Time: 04/06/23  9:00 AM   Specimen: Anterior Nasal Swab  Result Value Ref Range Status   SARS Coronavirus 2 by RT PCR NEGATIVE NEGATIVE Final    Comment: (NOTE) SARS-CoV-2 target nucleic acids are NOT DETECTED.  The SARS-CoV-2 RNA is generally detectable in upper and lower respiratory specimens during the acute phase of infection. The lowest concentration of SARS-CoV-2 viral copies this assay can detect is 250 copies / mL. A negative result does not preclude SARS-CoV-2 infection and should not be used as the sole basis for treatment or other patient management decisions.  A negative result may occur with improper specimen collection / handling, submission of specimen other than nasopharyngeal swab, presence of viral mutation(s) within the areas targeted by this assay, and inadequate number of viral copies (<250 copies / mL). A negative result must be combined with clinical observations, patient history, and epidemiological information.  Fact Sheet for Patients:   RoadLapTop.co.za  Fact Sheet for Healthcare Providers: http://kim-miller.com/  This test is not yet approved or  cleared by the  [place] FDA and has been authorized for detection and/or diagnosis of SARS-CoV-2 by FDA under an Emergency Use Authorization (EUA).  This EUA will remain in effect (meaning this test can be used) for the duration of the COVID-19 declaration under Section 564(b)(1) of the Act, 21 U.S.C. section 360bbb-3(b)(1), unless the authorization is terminated or revoked sooner.  Performed at [facility], [address] [place], [place] [address]   SARS Coronavirus 2 by RT PCR (hospital order, performed in [facility] hospital lab) *cepheid single result test*     Status: None   Collection Time: 04/07/23  9:09 PM  Result Value Ref Range Status   SARS Coronavirus 2 by RT PCR NEGATIVE NEGATIVE Final    Comment: (NOTE) SARS-CoV-2 target nucleic acids are NOT DETECTED.  The SARS-CoV-2 RNA is generally detectable in upper and lower respiratory specimens during the acute phase of infection. The lowest concentration of SARS-CoV-2 viral copies this assay can detect is 250 copies / mL. A negative result does not preclude SARS-CoV-2 infection and should not be used as the sole basis for treatment or other patient management decisions.  A negative result may occur with improper specimen collection / handling, submission of specimen other than nasopharyngeal swab, presence of viral mutation(s) within the areas targeted by this assay, and inadequate number of viral copies (<250 copies / mL). A negative result must be combined with clinical observations, patient history, and epidemiological information.  Fact Sheet for Patients:   RoadLapTop.co.za  Fact Sheet for Healthcare Providers: http://kim-miller.com/  This test is not yet approved or  cleared by the [place] FDA and has been authorized for detection and/or diagnosis of SARS-CoV-2 by FDA under an Emergency Use Authorization (EUA).  This EUA will remain in effect (meaning this test can be used)  for the duration of the COVID-19 declaration under Section 564(b)(1) of the Act, 21 U.S.C. section 360bbb-3(b)(1), unless the authorization is terminated or revoked sooner.  Performed at [facility], [address] [place], [place] [address]   MRSA Next Gen by PCR, Nasal  Status: None   Collection Time: 04/08/23  7:00 AM   Specimen: Nasal Mucosa; Nasal Swab  Result Value Ref Range Status   MRSA by PCR Next Gen NOT DETECTED NOT DETECTED Final    Comment: (NOTE) The GeneXpert MRSA Assay (FDA approved for NASAL specimens only), is one component of a comprehensive MRSA colonization surveillance program. It is not intended to diagnose MRSA infection nor to guide or monitor treatment for MRSA infections. Test performance is not FDA approved in patients less than 73 years old. Performed at [facility], [address] [place], [place] [address]          Radiology Studies: DG Chest Portable 1 View  Result Date: 04/07/2023 CLINICAL DATA:  Shortness of breath EXAM: PORTABLE CHEST 1 VIEW COMPARISON:  Chest x-ray dated April 06, 2023 FINDINGS: Patient rotation to the right somewhat limits evaluation. Cardiac and mediastinal contours are within normal limits when accounting for patient rotation. Increased right greater than left basilar opacities. No large pleural effusion or evidence of pneumothorax. IMPRESSION: Increased right greater than left basilar opacities, concerning for infection or aspiration. Recommend follow-up PA and lateral chest radiograph in 6-8 weeks to ensure resolution. Electronically Signed   By: Allegra Lai M.D.   On: 04/07/2023 15:55        Scheduled Meds:  enoxaparin (LOVENOX) injection  40 mg Subcutaneous Q24H   pantoprazole  40 mg Oral BID AC   Continuous Infusions:  azithromycin 500 mg (04/08/23 1656)   cefTRIAXone (ROCEPHIN)  IV 2 g (04/09/23 0516)     LOS: 2 days    Time spent: 55 minutes    Kendell Bane, MD [facility]  If  7PM-7AM, please contact night-coverage [email or web address] 04/09/2023, 2:00 PM

## 2023-04-10 ENCOUNTER — Other Ambulatory Visit (HOSPITAL_COMMUNITY): Payer: Self-pay

## 2023-04-10 ENCOUNTER — Inpatient Hospital Stay (HOSPITAL_COMMUNITY): Payer: Medicare Other | Admitting: Anesthesiology

## 2023-04-10 ENCOUNTER — Inpatient Hospital Stay (HOSPITAL_COMMUNITY): Payer: Medicare Other

## 2023-04-10 ENCOUNTER — Encounter (HOSPITAL_COMMUNITY)
Admission: EM | Disposition: A | Discharge status: Skilled Nursing Facility | Payer: Self-pay | Source: Home / Self Care | Attending: Family Medicine

## 2023-04-10 ENCOUNTER — Encounter (HOSPITAL_COMMUNITY): Payer: Self-pay | Admitting: Internal Medicine

## 2023-04-10 DIAGNOSIS — K449 Diaphragmatic hernia without obstruction or gangrene: Secondary | ICD-10-CM

## 2023-04-10 DIAGNOSIS — Z7189 Other specified counseling: Secondary | ICD-10-CM | POA: Diagnosis not present

## 2023-04-10 DIAGNOSIS — E119 Type 2 diabetes mellitus without complications: Secondary | ICD-10-CM

## 2023-04-10 DIAGNOSIS — K551 Chronic vascular disorders of intestine: Secondary | ICD-10-CM | POA: Diagnosis not present

## 2023-04-10 DIAGNOSIS — R1084 Generalized abdominal pain: Secondary | ICD-10-CM

## 2023-04-10 DIAGNOSIS — K2950 Unspecified chronic gastritis without bleeding: Secondary | ICD-10-CM | POA: Diagnosis not present

## 2023-04-10 DIAGNOSIS — Z515 Encounter for palliative care: Secondary | ICD-10-CM | POA: Diagnosis not present

## 2023-04-10 DIAGNOSIS — I10 Essential (primary) hypertension: Secondary | ICD-10-CM

## 2023-04-10 DIAGNOSIS — K222 Esophageal obstruction: Secondary | ICD-10-CM | POA: Diagnosis not present

## 2023-04-10 DIAGNOSIS — K2970 Gastritis, unspecified, without bleeding: Secondary | ICD-10-CM

## 2023-04-10 HISTORY — PX: BIOPSY: SHX5522

## 2023-04-10 HISTORY — PX: ESOPHAGOGASTRODUODENOSCOPY (EGD) WITH PROPOFOL: SHX5813

## 2023-04-10 LAB — GLUCOSE, CAPILLARY
Glucose-Capillary: 157 mg/dL — ABNORMAL HIGH (ref 70–99)
Glucose-Capillary: 146 mg/dL — ABNORMAL HIGH (ref 70–99)
Glucose-Capillary: 167 mg/dL — ABNORMAL HIGH (ref 70–99)

## 2023-04-10 SURGERY — ESOPHAGOGASTRODUODENOSCOPY (EGD) WITH PROPOFOL
Anesthesia: Choice

## 2023-04-10 MED ORDER — GUAIFENESIN-DM 100-10 MG/5ML PO SYRP
10.0000 mL | ORAL_SOLUTION | Freq: Three times a day (TID) | ORAL | Status: DC
  Administered 2023-04-10 – 2023-04-15 (×14): 10 mL via ORAL
  Filled 2023-04-10 (×14): qty 10

## 2023-04-10 MED ORDER — HYDRALAZINE HCL 20 MG/ML IJ SOLN
10.0000 mg | INTRAMUSCULAR | Status: DC | PRN
  Administered 2023-04-11 – 2023-04-13 (×4): 10 mg via INTRAVENOUS
  Filled 2023-04-10 (×4): qty 1

## 2023-04-10 MED ORDER — LEVALBUTEROL HCL 1.25 MG/0.5ML IN NEBU
1.2500 mg | INHALATION_SOLUTION | Freq: Four times a day (QID) | RESPIRATORY_TRACT | Status: DC
  Administered 2023-04-10 – 2023-04-11 (×4): 1.25 mg via RESPIRATORY_TRACT
  Filled 2023-04-10 (×6): qty 0.5

## 2023-04-10 MED ORDER — SERTRALINE HCL 50 MG PO TABS
50.0000 mg | ORAL_TABLET | Freq: Every day | ORAL | Status: DC
  Administered 2023-04-11 – 2023-04-15 (×5): 50 mg via ORAL
  Filled 2023-04-10 (×5): qty 1

## 2023-04-10 MED ORDER — PROPOFOL 500 MG/50ML IV EMUL
INTRAVENOUS | Status: DC | PRN
  Administered 2023-04-10: 200 ug/kg/min via INTRAVENOUS

## 2023-04-10 MED ORDER — IPRATROPIUM BROMIDE 0.02 % IN SOLN
0.5000 mg | Freq: Four times a day (QID) | RESPIRATORY_TRACT | Status: DC
  Administered 2023-04-10 – 2023-04-11 (×4): 0.5 mg via RESPIRATORY_TRACT
  Filled 2023-04-10 (×6): qty 2.5

## 2023-04-10 MED ORDER — SODIUM CHLORIDE 0.9 % IV SOLN: INTRAVENOUS | Status: DC

## 2023-04-10 MED ORDER — METHYLPREDNISOLONE SODIUM SUCC 40 MG IJ SOLR
40.0000 mg | Freq: Every day | INTRAMUSCULAR | Status: AC
  Administered 2023-04-10: 40 mg via INTRAVENOUS
  Filled 2023-04-10: qty 1

## 2023-04-10 MED ORDER — IOHEXOL 350 MG/ML SOLN
75.0000 mL | Freq: Once | INTRAVENOUS | Status: AC | PRN
  Administered 2023-04-10: 75 mL via INTRAVENOUS

## 2023-04-10 NOTE — Transfer of Care (Signed)
Immediate Anesthesia Transfer of Care Note  Patient: [name]  Procedure(s) Performed: ESOPHAGOGASTRODUODENOSCOPY (EGD) WITH PROPOFOL BIOPSY  Patient Location: PACU  Anesthesia Type:General  Level of Consciousness: awake, alert , oriented, and patient cooperative  Airway & Oxygen Therapy: Patient Spontanous Breathing  Post-op Assessment: Report given to RN, Post -op Vital signs reviewed and stable, and Patient moving all extremities X 4  Post vital signs: Reviewed and stable  Last Vitals:  Vitals Value Taken Time  BP 166/57 04/10/23 1330  Temp 98.3   Pulse 98 04/10/23 1330  Resp 26 04/10/23 1330  SpO2 96 % 04/10/23 1330  Vitals shown include unfiled device data.  Last Pain:  Vitals:   04/10/23 1311  TempSrc:   PainSc: 0-No pain         Complications: No notable events documented.

## 2023-04-10 NOTE — Progress Notes (Addendum)
Patient underwent Upper endoscopy with biopsies  under propofol sedation.  Tolerated the procedure adequately.   FINDINGS:  - Non- obstructing Schatzki ring.  - Erythematous mucosa in the antrum. Biopsied.  - 2 cm hiatal hernia.  - Normal duodenal bulb and second portion of the duodenum. -No evidence of tumor, bleeding or ulceration in the examined portions  RECOMMENDATIONS  -Follow up biopsy results -Likely cause of abdominal pain and weight loss is mesenteric ichemia , Obtain IR consult for vascular stenosis seen on CT -Spoke to the patient Criss, Bartles (Son) [phone] [facility]) who is agreeing for IR evaluation    Vista Lawman, MD Gastroenterology and Hepatology [facility]

## 2023-04-10 NOTE — Op Note (Signed)
[facility] Patient Name: [name] Procedure Date: 04/10/2023 12:58 PM MRN: [id number] Date of Birth: 1932/01/06 Attending MD: Sanjuan Dame , MD, [id number] CSN: [id number] Age: 89 Admit Type: Outpatient Procedure:                Upper GI endoscopy Indications:              Generalized abdominal pain, Weight loss Providers:                Sanjuan Dame, MD, Edrick Kins, RN, Francoise Ceo                            RN, RN, Dyann Ruddle Referring MD:              Medicines:                 Complications:            No immediate complications. Estimated Blood Loss:     Estimated blood loss: none. Procedure:                Pre-Anesthesia Assessment:                           - Prior to the procedure, a History and Physical                            was performed, and patient medications and                            allergies were reviewed. The patient's tolerance of                            previous anesthesia was also reviewed. The risks                            and benefits of the procedure and the sedation                            options and risks were discussed with the patient.                            All questions were answered, and informed consent                            was obtained. Prior Anticoagulants: The patient has                            taken no anticoagulant or antiplatelet agents                            except for aspirin. ASA Grade Assessment: III - A                            patient with severe systemic disease. After  reviewing the risks and benefits, the patient was                            deemed in satisfactory condition to undergo the                            procedure.                           After obtaining informed consent, the endoscope was                            passed under direct vision. Throughout the                            procedure, the patient's blood pressure, pulse, and                             oxygen saturations were monitored continuously. The                            [id number]-[id number] ([id number]) scope was introduced through the                            mouth, and advanced to the second part of duodenum.                            The upper GI endoscopy was accomplished without                            difficulty. The patient tolerated the procedure                            well. Scope In: 1:18:06 PM Scope Out: 1:21:21 PM Total Procedure Duration: 0 hours 3 minutes 15 seconds  Findings:      A non-obstructing Schatzki ring was found in the lower third of the       esophagus.      Mildly erythematous mucosa without bleeding was found in the gastric       antrum. Biopsies were taken with a cold forceps for histology.      A 2 cm hiatal hernia was present.      The duodenal bulb and second portion of the duodenum were normal. Impression:               - Non-obstructing Schatzki ring.                           - Erythematous mucosa in the antrum. Biopsied.                           - 2 cm hiatal hernia.                           - Normal duodenal bulb and second portion of the  duodenum.                           No evidence of tumor, bleeding or ulceration in the                            examined portions Moderate Sedation:      Per Anesthesia Care Recommendation:           Follow up biopsy                           Likely cause of abdominal pain and weight loss is                            likely mesentric ichemia , Obtain IR consult for                            vascular stenosis seen on CT Procedure Code(s):        --- Professional ---                           [phone], Esophagogastroduodenoscopy, flexible,                            transoral; with biopsy, single or multiple Diagnosis Code(s):        --- Professional ---                           K22.2, Esophageal obstruction                           K31.89, Other  diseases of stomach and duodenum                           K44.9, Diaphragmatic hernia without obstruction or                            gangrene                           R10.84, Generalized abdominal pain                           R63.4, Abnormal weight loss CPT copyright 2022 American Medical Association. All rights reserved. The codes documented in this report are preliminary and upon coder review may  be revised to meet current compliance requirements. Sanjuan Dame, MD Sanjuan Dame, MD 04/10/2023 1:25:08 PM This report has been signed electronically. Number of Addenda: 0

## 2023-04-10 NOTE — Progress Notes (Signed)
Palliative:  [name] is lying quietly in bed.  She appears chronically ill.  She greets me, making and mostly keeping eye contact.  She is alert and oriented, able to make her needs known.  There is no family at bedside at this time.  We talked about her respiratory status.  We talk about the plan for EGD today.  [name] asks appropriate questions.  She shares that she is hopeful that the medical team can find some reversible/treatable cause of her decline over the last few weeks.  She shares that if she cannot find relief and treatment, "I don't want to go on like this".  I reassure her that she will be cared for.   Conference with attending, bedside nursing staff and transition of care team, related to patient condition, needs, related to the above assessment and plan.   Plan:  Continue to treat, but no CPR or intubation.  EGD today. Goal is to return to [facility].   35 minutes  Lillia Carmel, NP Palliative Medicine Team  Team Phone [phone]

## 2023-04-10 NOTE — Progress Notes (Signed)
Patient briefly seen to assess candidacy for EGD today. Dr. Flossie Dibble at bedside. Patient does continue to have cough. Her oxygen has been stable on room air, 93% this morning. He feels she is stable for EGD today. Patient desires EGD today. She has some wheezing/rhonchi in the bases. Normal bowel sounds. Mild generalized abdominal tenderness noted on exam. Discussed with Dr. Tasia Catchings. Plan for EGD today. Will also plan to get IR consult regarding possible chronic mesenteric ischemia playing a role in her symptoms.   Leanna Battles. [facility] [phone] 10/10/202411:18 AM

## 2023-04-10 NOTE — Plan of Care (Signed)
  Problem: Clinical Measurements: Goal: Will remain free from infection Outcome: Progressing Goal: Cardiovascular complication will be avoided Outcome: Progressing   Problem: Clinical Measurements: Goal: Cardiovascular complication will be avoided Outcome: Progressing   Problem: Coping: Goal: Level of anxiety will decrease Outcome: Progressing   Problem: Pain Managment: Goal: General experience of comfort will improve Outcome: Progressing

## 2023-04-10 NOTE — Progress Notes (Signed)
   IR Note  Request for consideration of Mesenteric arteriogram with treatment for mesenteric ischemia dependent on results of EGD today  Dr Elby Showers has reviewed imaging at this point Rec:  Mesenteric duplex and see in OP clinic for full consult regarding mesenteric arteriogram with treatment.  Chat into GI docs at [facility]

## 2023-04-10 NOTE — Interval H&P Note (Signed)
History and Physical Interval Note:  04/10/2023 12:17 PM  [name]  has presented today for surgery, with the diagnosis of abdominal pain, nausea.  The various methods of treatment have been discussed with the patient and family. After consideration of risks, benefits and other options for treatment, the patient has consented to  Procedure(s): ESOPHAGOGASTRODUODENOSCOPY (EGD) WITH PROPOFOL (N/A) as a surgical intervention.  The patient's history has been reviewed, patient examined, no change in status, stable for surgery.  I have reviewed the patient's chart and labs.  Questions were answered to the patient's satisfaction.     Juanetta Beets Cheresa Siers

## 2023-04-10 NOTE — Progress Notes (Signed)
PROGRESS NOTE    [name]  VOZ:[id number] DOB: December 17, 1931 DOA: 04/07/2023 PCP: Babs Sciara, MD   Brief Narrative:    [name] is a vibrant 89 y.o. 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 knee replacement in 2023 who was walking a mile a day at the end of last year.  Earlier this year she was down to half a mile a day.  In the last 3 months the patient has been plagued with intermittent diarrhea, poor appetite, and progressive weakness.  She appears to have hyponatremia that is improving as well as community-acquired pneumonia seen on chest x-ray.  She is also noted to have increasing weight loss and malaise that may be related to mesenteric ischemia.  GI asked to evaluate for this condition to see if IR intervention could be beneficial.  Palliative also consulted for further goals of care discussion.    Subjective:  The patient was seen and examined this morning, reporting improved abdominal pain, no nausea or vomiting overnight. N.p.o. Willing to go for EGD Mildly withdrawn, depressed..  Denies of any homicidal suicidal ideation   Assessment & Plan:   Principal Problem:   Mesenteric artery stenosis (HCC) Active Problems:   Abdominal pain, chronic, epigastric  Assessment and Plan:   Community-acquired pneumonia Continue to have wheezes and rhonchi -Will add IV Solu-Medrol, breathing treatment DuoNeb bronchodilators -No signs of SIRS /sepsis ruled out -Continue azithromycin and Rocephin -Legionella neg  Hyponatremia-improving Serum sodium level 126, 133, 133 -Continue to monitor in am; appears euvolemic -Urine Osm, Serum Osm, Urine Na, TSH ordered and pending -Follow in am  Concern for mesenteric ischemia with high-grade stenosis to IMA as well as 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 in anticipation of EGD today -GI following -Anticipating EGD today 04/09/2023  -on PPI  -Noted to have  some postprandial burning sensation as well as 8 pound weight loss recently -May need IR evaluation if agreeable to intervention  Recurrent diarrhea-, resolved -Last bowel movement was this past Friday, continue to monitor -Start fiber supplementation and hold antimotility agents  Concern for failure to thrive -Appears to have poor long-term prognosis and does not appear to want aggressive measures -Appreciate palliative consultation   Depression/withdrawal -Patient Zoloft was held, will be resumed post EGD Continue as needed Xanax   DVT prophylaxis:Lovenox Code Status: DNR Family Communication: None at bedside Disposition Plan:  Status is: Inpatient Remains inpatient appropriate because: Need for IV medications and inpt procedure  Consultants:  GI Palliative  Procedures:  None  Antimicrobials:  Anti-infectives (From admission, onward)    Start     Dose/Rate Route Frequency Ordered Stop   04/08/23 1600  [MAR Hold]  azithromycin (ZITHROMAX) 500 mg in sodium chloride 0.9 % 250 mL IVPB        (MAR Hold since Thu 04/10/2023 at 1150.Hold Reason: Transfer to a Procedural area)   500 mg 250 mL/hr over 60 Minutes Intravenous Every 24 hours 04/07/23 2043     04/08/23 0530  [MAR Hold]  cefTRIAXone (ROCEPHIN) 2 g in sodium chloride 0.9 % 100 mL IVPB        (MAR Hold since Thu 04/10/2023 at 1150.Hold Reason: Transfer to a Procedural area)   2 g 200 mL/hr over 30 Minutes Intravenous Every 24 hours 04/07/23 2042     04/07/23 1545  cefTRIAXone (ROCEPHIN) 1 g in sodium chloride 0.9 % 100 mL IVPB  1 g 200 mL/hr over 30 Minutes Intravenous  Once 04/07/23 1535 04/07/23 1618   04/07/23 1545  azithromycin (ZITHROMAX) 500 mg in sodium chloride 0.9 % 250 mL IVPB        500 mg 250 mL/hr over 60 Minutes Intravenous  Once 04/07/23 1535 04/07/23 1724        Objective: Vitals:   04/09/23 1914 04/10/23 0506 04/10/23 1200 04/10/23 1208  BP: (!) 159/63 (!) 177/61 (!) 176/72   Pulse: 100  83 (!) 104 79  Resp: 16 20 (!) 26 (!) 23  Temp: 98.9 F (37.2 C) 98.9 F (37.2 C) 98.6 F (37 C)   TempSrc:      SpO2: 92% 93% 94% 98%  Weight:      Height:        Intake/Output Summary (Last 24 hours) at 04/10/2023 1225 Last data filed at 04/10/2023 0900 Gross per 24 hour  Intake 0 ml  Output 400 ml  Net -400 ml   Filed Weights   04/08/23 0637  Weight: 72.6 kg         General:  AAO x 3,  cooperative, no distress;   HEENT:  Normocephalic, PERRL, otherwise with in Normal limits   Neuro:  CNII-XII intact. , normal motor and sensation, reflexes intact   Lungs:   Clear to auscultation BL, Respirations unlabored,  ++ wheezes / crackles  Cardio:    S1/S2, RRR, No murmure, No Rubs or Gallops   Abdomen:  Soft, non-tender, bowel sounds active all four quadrants, no guarding or peritoneal signs.  Muscular  skeletal:  Limited exam -global generalized weaknesses - in bed, able to move all 4 extremities,   2+ pulses,  symmetric, No pitting edema  Skin:  Dry, warm to touch, negative for any Rashes,  Wounds: Please see nursing documentation             Data Reviewed: I have personally reviewed following labs and imaging studies  CBC: Recent Labs  Lab 04/06/23 0940 04/07/23 1455 04/09/23 0437  WBC 9.6 7.2 8.8  NEUTROABS 7.9* 4.7  --   HGB 12.4 11.7* 11.3*  HCT 36.6 35.8* 35.4*  MCV 88.0 88.8 90.3  PLT 249 245 275   Basic Metabolic Panel: Recent Labs  Lab 04/04/23 0850 04/06/23 0940 04/07/23 1455 04/08/23 0247 04/09/23 0437  NA 131* 126* 126* 133* 133*  K 5.0 4.1 3.9 3.7 3.5  CL 90* 89* 90* 96* 94*  CO2 25 29 28 27 29   GLUCOSE 132* 145* 107* 117* 128*  BUN 17 14 16 12 16   CREATININE 0.76 0.71 0.71 0.57 0.80  CALCIUM 9.8 8.8* 8.6* 8.8* 9.0  MG  --   --   --   --  1.7   GFR: Estimated Creatinine Clearance: 48.1 mL/min (by C-G formula based on SCr of 0.8 mg/dL). Liver Function Tests: Recent Labs  Lab 04/07/23 1455 04/08/23 0247 04/09/23 0437  AST  17 17 13*  ALT 15 14 14   ALKPHOS 68 61 58  BILITOT 0.6 1.0 0.8  PROT 6.3* 6.0* 6.0*  ALBUMIN 3.5 3.3* 3.1*   CBG: Recent Labs  Lab 04/09/23 0725 04/09/23 1101 04/09/23 2324 04/10/23 0506 04/10/23 1109  GLUCAP 140* 149* 148* 157* 146*   Lipid Profile: No results for input(s): "CHOL", "HDL", "LDLCALC", "TRIG", "CHOLHDL", "LDLDIRECT" in the last 72 hours. Thyroid Function Tests: Recent Labs    04/08/23 1442  TSH 1.161   Anemia Panel:   Recent Results (from the past 240 hour(s))  Microscopic Examination     Status: Abnormal   Collection Time: 04/04/23  8:50 AM  Result Value Ref Range Status   WBC, UA 6-10 (A) 0 - 5 /hpf Final   RBC, Urine 0-2 0 - 2 /hpf Final   Epithelial Cells (non renal) 0-10 0 - 10 /hpf Final   Casts None seen None seen /lpf Final   Bacteria, UA None seen None seen/Few Final  Urine Culture, Reflex     Status: None   Collection Time: 04/04/23  8:50 AM  Result Value Ref Range Status   Urine Culture, Routine Final report  Final   Organism ID, Bacteria Comment  Final    Comment: Mixed urogenital flora Less than 10,000 colonies/mL   SARS Coronavirus 2 by RT PCR (hospital order, performed in [facility] hospital lab) *cepheid single result test* Anterior Nasal Swab     Status: None   Collection Time: 04/06/23  9:00 AM   Specimen: Anterior Nasal Swab  Result Value Ref Range Status   SARS Coronavirus 2 by RT PCR NEGATIVE NEGATIVE Final    Comment: (NOTE) SARS-CoV-2 target nucleic acids are NOT DETECTED.  The SARS-CoV-2 RNA is generally detectable in upper and lower respiratory specimens during the acute phase of infection. The lowest concentration of SARS-CoV-2 viral copies this assay can detect is 250 copies / mL. A negative result does not preclude SARS-CoV-2 infection and should not be used as the sole basis for treatment or other patient management decisions.  A negative result may occur with improper specimen collection / handling, submission of  specimen other than nasopharyngeal swab, presence of viral mutation(s) within the areas targeted by this assay, and inadequate number of viral copies (<250 copies / mL). A negative result must be combined with clinical observations, patient history, and epidemiological information.  Fact Sheet for Patients:   RoadLapTop.co.za  Fact Sheet for Healthcare Providers: http://kim-miller.com/  This test is not yet approved or  cleared by the [place] FDA and has been authorized for detection and/or diagnosis of SARS-CoV-2 by FDA under an Emergency Use Authorization (EUA).  This EUA will remain in effect (meaning this test can be used) for the duration of the COVID-19 declaration under Section 564(b)(1) of the Act, 21 U.S.C. section 360bbb-3(b)(1), unless the authorization is terminated or revoked sooner.  Performed at [facility], [address] [place], [place] [address]   SARS Coronavirus 2 by RT PCR (hospital order, performed in [facility] hospital lab) *cepheid single result test*     Status: None   Collection Time: 04/07/23  9:09 PM  Result Value Ref Range Status   SARS Coronavirus 2 by RT PCR NEGATIVE NEGATIVE Final    Comment: (NOTE) SARS-CoV-2 target nucleic acids are NOT DETECTED.  The SARS-CoV-2 RNA is generally detectable in upper and lower respiratory specimens during the acute phase of infection. The lowest concentration of SARS-CoV-2 viral copies this assay can detect is 250 copies / mL. A negative result does not preclude SARS-CoV-2 infection and should not be used as the sole basis for treatment or other patient management decisions.  A negative result may occur with improper specimen collection / handling, submission of specimen other than nasopharyngeal swab, presence of viral mutation(s) within the areas targeted by this assay, and inadequate number of viral copies (<250 copies / mL). A negative result must be  combined with clinical observations, patient history, and epidemiological information.  Fact Sheet for Patients:   RoadLapTop.co.za  Fact Sheet for Healthcare Providers:  http://kim-miller.com/  This test is not yet approved or  cleared by the [place] and has been authorized for detection and/or diagnosis of SARS-CoV-2 by FDA under an Emergency Use Authorization (EUA).  This EUA will remain in effect (meaning this test can be used) for the duration of the COVID-19 declaration under Section 564(b)(1) of the Act, 21 U.S.C. section 360bbb-3(b)(1), unless the authorization is terminated or revoked sooner.  Performed at [facility], [address] [place], [place] [address]   MRSA Next Gen by PCR, Nasal     Status: None   Collection Time: 04/08/23  7:00 AM   Specimen: Nasal Mucosa; Nasal Swab  Result Value Ref Range Status   MRSA by PCR Next Gen NOT DETECTED NOT DETECTED Final    Comment: (NOTE) The GeneXpert MRSA Assay (FDA approved for NASAL specimens only), is one component of a comprehensive MRSA colonization surveillance program. It is not intended to diagnose MRSA infection nor to guide or monitor treatment for MRSA infections. Test performance is not FDA approved in patients less than 46 years old. Performed at [facility], [address], [place], [place] [address]          Radiology Studies: No results found.      Scheduled Meds:  [MAR Hold] enoxaparin (LOVENOX) injection  40 mg Subcutaneous Q24H   [MAR Hold] guaiFENesin-dextromethorphan  10 mL Oral Q8H   [MAR Hold] ipratropium  0.5 mg Nebulization Q6H   [MAR Hold] levalbuterol  1.25 mg Nebulization Q6H   [MAR Hold] pantoprazole  40 mg Oral BID AC   [MAR Hold] sertraline  50 mg Oral Daily   Continuous Infusions:  sodium chloride     [MAR Hold] azithromycin 500 mg (04/09/23 1619)   [MAR Hold] cefTRIAXone (ROCEPHIN)  IV 2 g (04/10/23 0529)     LOS: 3  days    Time spent: 55 minutes    Kendell Bane, MD [facility]  If 7PM-7AM, please contact night-coverage [email or web address] 04/10/2023, 12:25 PM

## 2023-04-10 NOTE — Anesthesia Preprocedure Evaluation (Addendum)
Anesthesia Evaluation    Airway        Dental   Pulmonary           Cardiovascular hypertension,      Neuro/Psych    GI/Hepatic   Endo/Other  diabetes    Renal/GU      Musculoskeletal   Abdominal   Peds  Hematology   Anesthesia Other Findings   Reproductive/Obstetrics                             Anesthesia Physical Anesthesia Plan  ASA: 3  Anesthesia Plan: General   Post-op Pain Management:    Induction:   PONV Risk Score and Plan: 2  Airway Management Planned:   Additional Equipment:   Intra-op Plan:   Post-operative Plan:   Informed Consent: I have reviewed the patients History and Physical, chart, labs and discussed the procedure including the risks, benefits and alternatives for the proposed anesthesia with the patient or authorized representative who has indicated his/her understanding and acceptance.       Plan Discussed with: CRNA and Anesthesiologist  Anesthesia Plan Comments:        Anesthesia Quick Evaluation

## 2023-04-11 ENCOUNTER — Ambulatory Visit (HOSPITAL_COMMUNITY)
Admission: RE | Admit: 2023-04-11 | Discharge: 2023-04-11 | Disposition: A | Discharge status: Home / Self Care | Payer: Medicare Other | Source: Ambulatory Visit | Attending: Radiology | Admitting: Radiology

## 2023-04-11 ENCOUNTER — Inpatient Hospital Stay (HOSPITAL_COMMUNITY): Payer: Medicare Other

## 2023-04-11 DIAGNOSIS — I701 Atherosclerosis of renal artery: Secondary | ICD-10-CM | POA: Insufficient documentation

## 2023-04-11 DIAGNOSIS — Z7984 Long term (current) use of oral hypoglycemic drugs: Secondary | ICD-10-CM | POA: Insufficient documentation

## 2023-04-11 DIAGNOSIS — K551 Chronic vascular disorders of intestine: Secondary | ICD-10-CM | POA: Insufficient documentation

## 2023-04-11 DIAGNOSIS — I10 Essential (primary) hypertension: Secondary | ICD-10-CM | POA: Insufficient documentation

## 2023-04-11 DIAGNOSIS — E785 Hyperlipidemia, unspecified: Secondary | ICD-10-CM | POA: Insufficient documentation

## 2023-04-11 DIAGNOSIS — E119 Type 2 diabetes mellitus without complications: Secondary | ICD-10-CM | POA: Insufficient documentation

## 2023-04-11 HISTORY — PX: IR INTRAVASCULAR ULTRASOUND NON CORONARY: IMG6085

## 2023-04-11 HISTORY — PX: IR US GUIDE VASC ACCESS RIGHT: IMG2390

## 2023-04-11 HISTORY — PX: IR ANGIOGRAM VISCERAL SELECTIVE: IMG657

## 2023-04-11 HISTORY — PX: IR TRANSCATH PLC STENT 1ST ART NOT LE CV CAR VERT CAR: IMG5443

## 2023-04-11 LAB — MRSA NEXT GEN BY PCR, NASAL: MRSA by PCR Next Gen: NOT DETECTED

## 2023-04-11 LAB — GLUCOSE, CAPILLARY
Glucose-Capillary: 130 mg/dL — ABNORMAL HIGH (ref 70–99)
Glucose-Capillary: 131 mg/dL — ABNORMAL HIGH (ref 70–99)
Glucose-Capillary: 132 mg/dL — ABNORMAL HIGH (ref 70–99)
Glucose-Capillary: 152 mg/dL — ABNORMAL HIGH (ref 70–99)
Glucose-Capillary: 182 mg/dL — ABNORMAL HIGH (ref 70–99)

## 2023-04-11 LAB — PROTIME-INR
INR: 1.1 (ref 0.8–1.2)
Prothrombin Time: 14.2 s (ref 11.4–15.2)

## 2023-04-11 LAB — CBC
HCT: 32.6 % — ABNORMAL LOW (ref 36.0–46.0)
Hemoglobin: 10.5 g/dL — ABNORMAL LOW (ref 12.0–15.0)
MCH: 29.3 pg (ref 26.0–34.0)
MCHC: 32.2 g/dL (ref 30.0–36.0)
MCV: 91.1 fL (ref 80.0–100.0)
Platelets: 277 10*3/uL (ref 150–400)
RBC: 3.58 MIL/uL — ABNORMAL LOW (ref 3.87–5.11)
RDW: 13.6 % (ref 11.5–15.5)
WBC: 8.6 10*3/uL (ref 4.0–10.5)
nRBC: 0 % (ref 0.0–0.2)

## 2023-04-11 LAB — BASIC METABOLIC PANEL
Anion gap: 9 (ref 5–15)
BUN: 15 mg/dL (ref 8–23)
CO2: 29 mmol/L (ref 22–32)
Calcium: 8.9 mg/dL (ref 8.9–10.3)
Chloride: 94 mmol/L — ABNORMAL LOW (ref 98–111)
Creatinine, Ser: 0.79 mg/dL (ref 0.44–1.00)
GFR, Estimated: >60 mL/min (ref >60)
Glucose, Bld: 128 mg/dL — ABNORMAL HIGH (ref 70–99)
Potassium: 3.7 mmol/L (ref 3.5–5.1)
Sodium: 132 mmol/L — ABNORMAL LOW (ref 135–145)

## 2023-04-11 MED ORDER — METHYLPREDNISOLONE SODIUM SUCC 40 MG IJ SOLR
40.0000 mg | Freq: Every day | INTRAMUSCULAR | Status: AC
  Filled 2023-04-11: qty 1

## 2023-04-11 MED ORDER — HEPARIN SODIUM (PORCINE) 1000 UNIT/ML IJ SOLN
INTRAMUSCULAR | Status: AC
  Filled 2023-04-11: qty 10

## 2023-04-11 MED ORDER — CHLORHEXIDINE GLUCONATE CLOTH 2 % EX PADS
6.0000 | MEDICATED_PAD | Freq: Every day | CUTANEOUS | Status: DC
  Administered 2023-04-11 – 2023-04-13 (×3): 6 via TOPICAL

## 2023-04-11 MED ORDER — ASPIRIN 81 MG PO CHEW
324.0000 mg | CHEWABLE_TABLET | Freq: Every day | ORAL | Status: DC
  Administered 2023-04-11 – 2023-04-15 (×5): 324 mg via ORAL
  Filled 2023-04-11 (×5): qty 4

## 2023-04-11 MED ORDER — HYDRALAZINE HCL 20 MG/ML IJ SOLN
INTRAMUSCULAR | Status: AC
  Filled 2023-04-11: qty 1

## 2023-04-11 MED ORDER — POVIDONE (PF) 0.5 % OP SOLN
1.0000 [drp] | Freq: Three times a day (TID) | OPHTHALMIC | Status: DC
  Administered 2023-04-11 – 2023-04-12 (×2): 1 [drp] via OPHTHALMIC

## 2023-04-11 MED ORDER — HYDRALAZINE HCL 20 MG/ML IJ SOLN
INTRAMUSCULAR | Status: AC | PRN
  Administered 2023-04-11: 10 mg via INTRAVENOUS

## 2023-04-11 MED ORDER — MIDAZOLAM HCL 2 MG/2ML IJ SOLN
INTRAMUSCULAR | Status: AC
  Filled 2023-04-11: qty 2

## 2023-04-11 MED ORDER — LIDOCAINE HCL 1 % IJ SOLN
INTRAMUSCULAR | Status: AC
  Filled 2023-04-11: qty 20

## 2023-04-11 MED ORDER — CYCLOSPORINE 0.05 % OP EMUL
1.0000 [drp] | Freq: Two times a day (BID) | OPHTHALMIC | Status: DC
  Administered 2023-04-11 – 2023-04-15 (×8): 1 [drp] via OPHTHALMIC
  Filled 2023-04-11 (×8): qty 30

## 2023-04-11 MED ORDER — FENTANYL CITRATE (PF) 100 MCG/2ML IJ SOLN
INTRAMUSCULAR | Status: AC | PRN
Start: 2023-04-11 — End: 2023-04-11
  Administered 2023-04-11: 50 ug via INTRAVENOUS
  Administered 2023-04-11: 25 ug via INTRAVENOUS

## 2023-04-11 MED ORDER — FENTANYL CITRATE (PF) 100 MCG/2ML IJ SOLN
INTRAMUSCULAR | Status: AC
  Filled 2023-04-11: qty 2

## 2023-04-11 MED ORDER — MIDAZOLAM HCL 2 MG/2ML IJ SOLN
INTRAMUSCULAR | Status: AC | PRN
Start: 2023-04-11 — End: 2023-04-11
  Administered 2023-04-11: 1 mg via INTRAVENOUS
  Administered 2023-04-11: .5 mg via INTRAVENOUS

## 2023-04-11 MED ORDER — ALPRAZOLAM 0.25 MG PO TABS
0.2500 mg | ORAL_TABLET | Freq: Three times a day (TID) | ORAL | Status: DC
  Administered 2023-04-11 – 2023-04-13 (×8): 0.25 mg via ORAL
  Filled 2023-04-11 (×9): qty 1

## 2023-04-11 MED ORDER — IOHEXOL 300 MG/ML  SOLN
150.0000 mL | Freq: Once | INTRAMUSCULAR | Status: AC | PRN
  Administered 2023-04-11: 80 mL via INTRA_ARTERIAL

## 2023-04-11 NOTE — Sedation Documentation (Signed)
Dr. Elby Showers notified about bladder retention since pt arrived to hospital. Pt catheterized for relief.

## 2023-04-11 NOTE — Sedation Documentation (Signed)
Provider notified about hypertensive BP. Hydralazine 10mg  given

## 2023-04-11 NOTE — Procedures (Signed)
Interventional Radiology Procedure Note  Procedure:  1) Mesenteric Angiogram 2) SMA stent placement  Findings: Please refer to procedural dictation for full description. Severe focal stenosis of the SMA ostium with associated fibrofatty and calcified atherosclerotic plaque.  Technically successful placement of 7 mm x 29 mm VBX with ostial flare up to 9 mm.  Right CFA access, 8 Fr Angioseal closure.  Complications: None immediate  Estimated Blood Loss: < 5 mL  Recommendations: Strict 2 hour bedrest. Transfer back to [facility]. Consider daily 81 mg ASA. IR will arrange 1 month outpatient follow up with mesenteric duplex.   Marliss Coots, MD

## 2023-04-11 NOTE — Progress Notes (Signed)
PT Cancellation Note  Patient Details Name: [name] MRN: [id number] DOB: Sep 19, 1931   Cancelled Treatment:   TReatment attempted PT is out of her room     Virgina Organ, PT CLT [phone]  04/11/2023, 4:43 PM

## 2023-04-11 NOTE — TOC Progression Note (Signed)
Transition of Care [facility]) - Progression Note    Patient Details  Name: [name] MRN: [id number] Date of Birth: 06-11-1932  [facility] CM/SW Contact  Elliot Gault, LCSW Phone Number: 04/11/2023, 2:42 PM  Clinical Narrative:     TOC following. Updated by MD that pt's son indicating that they feel pt will likely need SNF rehab at dc. CMS provider options and ratings reviewed. Awaiting PT consult. If SNF recommended, will follow up with pt to review and will refer out as she requests.   Will follow.  Expected Discharge Plan: Skilled Nursing Facility Barriers to Discharge: Continued Medical Work up  Expected Discharge Plan and Services In-house Referral: Clinical Social Work   Post Acute Care Choice: Skilled Nursing Facility Living arrangements for the past 2 months: Assisted Living Facility                                       Social Determinants of Health (SDOH) Interventions SDOH Screenings   Food Insecurity: No Food Insecurity (04/08/2023)  Housing: Patient Declined (04/08/2023)  Transportation Needs: No Transportation Needs (04/08/2023)  Utilities: Not At Risk (04/08/2023)  Alcohol Screen: Low Risk  (09/06/2022)  Depression (PHQ2-9): Low Risk  (01/08/2023)  Financial Resource Strain: Low Risk  (09/06/2022)  Physical Activity: Sufficiently Active (09/06/2022)  Social Connections: Moderately Isolated (09/06/2022)  Stress: No Stress Concern Present (09/06/2022)  Tobacco Use: Low Risk  (04/08/2023)    Readmission Risk Interventions    04/08/2023    2:11 PM  Readmission Risk Prevention Plan  Transportation Screening Complete  PCP or Specialist Appt within 3-5 Days Not Complete  HRI or Home Care Consult Complete  Social Work Consult for Recovery Care Planning/Counseling Complete  Palliative Care Screening Not Applicable  Medication Review Oceanographer) Complete

## 2023-04-11 NOTE — Progress Notes (Signed)
PROGRESS NOTE    Linda WELDE  [name]:[id number] DOB: 01-04-1932 DOA: 04/07/2023 PCP: Babs Sciara, MD   Brief Narrative:    [name] is a vibrant 89 y.o. 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 knee replacement in 2023 who was walking a mile a day at the end of last year.  Earlier this year she was down to half a mile a day.  In the last 3 months the patient has been plagued with intermittent diarrhea, poor appetite, and progressive weakness.  She appears to have hyponatremia that is improving as well as community-acquired pneumonia seen on chest x-ray.  She is also noted to have increasing weight loss and malaise that may be related to mesenteric ischemia.  GI asked to evaluate for this condition to see if IR intervention could be beneficial.  Palliative also consulted for further goals of care discussion.    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 Approximately 50% stenosis at the origin of the celiac artery trunk. No significant stenosis involving the superior mesenteric artery. At least mild stenosis involving the origin of the inferior mesenteric artery. 2. Bilateral renal artery stenosis. Multiple left renal arteries as described.  -Discussed the case with GI and IR Dr. Elby Showers  Patient is to go to [facility] for mesenteric artery duplex and  possible stenting today 04/11/2023  -on PPI  -Noted to have some postprandial burning sensation as well as 8 pound weight loss recently   Community-acquired pneumonia Continue to have cough, wheezing, rhonchi, mild shortness of breath, satting in 8% on room air -Will continue with IV Solu-Medrol, breathing treatment DuoNeb bronchodilators -No signs of SIRS /sepsis ruled out -Continue azithromycin and Rocephin -Legionella neg  Hyponatremia-improving Serum sodium level 126, 133, 133, 132  -Continue to monitor in am; appears euvolemic -Urine Osm, Serum Osm, Urine Na, TSH ordered and pending -Follow in am   Recurrent diarrhea-, resolved -Last bowel movement was this past Friday, continue to monitor -Start fiber supplementation and hold antimotility agents  Concern for failure to thrive -Appears to have poor long-term prognosis and does not appear to want aggressive measures -Appreciate palliative consultation   Depression/withdrawal -Patient Zoloft was held, will be resumed post EGD Continue as needed Xanax   DVT prophylaxis:Lovenox Code Status: DNR Family Communication: None at bedside/findings: Plan of care discussed with his son over the phone 04/11/2023  Disposition Plan:  Status is: Inpatient Remains inpatient appropriate because: Need for IV medications and inpt procedure Disposition-likely SNF family requested [facility] consulted  Consultants:  GI Palliative  Procedures:  CTA   Antimicrobials:  Anti-infectives (From admission, onward)    Start     Dose/Rate Route Frequency Ordered Stop   04/08/23 1600  azithromycin (ZITHROMAX) 500 mg in sodium chloride 0.9 % 250 mL IVPB        500 mg 250 mL/hr over 60 Minutes Intravenous Every 24 hours 04/07/23 2043  04/08/23 0530  cefTRIAXone (ROCEPHIN) 2 g in sodium chloride 0.9 % 100 mL IVPB        2 g 200 mL/hr over 30 Minutes Intravenous Every 24 hours 04/07/23 2042     04/07/23 1545  cefTRIAXone (ROCEPHIN)  1 g in sodium chloride 0.9 % 100 mL IVPB        1 g 200 mL/hr over 30 Minutes Intravenous  Once 04/07/23 1535 04/07/23 1618   04/07/23 1545  azithromycin (ZITHROMAX) 500 mg in sodium chloride 0.9 % 250 mL IVPB        500 mg 250 mL/hr over 60 Minutes Intravenous  Once 04/07/23 1535 04/07/23 1724        Objective: Vitals:   04/11/23 0024 04/11/23 0026 04/11/23 0508 04/11/23 0816  BP: (!) 135/44 (!) 140/42 (!) 146/62   Pulse:  71 70   Resp:  16 20   Temp:  99 F (37.2 C) 98.2 F (36.8 C)   TempSrc:      SpO2:  91% 97% 98%  Weight:      Height:       No intake or output data in the 24 hours ending 04/11/23 1054  Filed Weights   04/08/23 0637  Weight: 72.6 kg     General:  AAO x 3,  cooperative, no distress;   HEENT:  Normocephalic, PERRL, otherwise with in Normal limits   Neuro:  CNII-XII intact. , normal motor and sensation, reflexes intact   Lungs:   Clear to auscultation BL, Respirations unlabored,  No wheezes / crackles  Cardio:    S1/S2, RRR, No murmure, No Rubs or Gallops   Abdomen:  Soft, tender with deep palpation, bowel sounds active all four quadrants, no guarding or peritoneal signs.  Muscular  skeletal:  Limited exam -global generalized weaknesses - in bed, able to move all 4 extremities,   2+ pulses,  symmetric, No pitting edema  Skin:  Dry, warm to touch, negative for any Rashes,  Wounds: Please see nursing documentation          Data Reviewed: I have personally reviewed following labs and imaging studies  CBC: Recent Labs  Lab 04/06/23 0940 04/07/23 1455 04/09/23 0437 04/11/23 0432  WBC 9.6 7.2 8.8 8.6  NEUTROABS 7.9* 4.7  --   --   HGB 12.4 11.7* 11.3* 10.5*  HCT 36.6 35.8* 35.4* 32.6*  MCV 88.0 88.8 90.3 91.1  PLT 249 245 275 277   Basic Metabolic Panel: Recent Labs  Lab 04/06/23 0940 04/07/23 1455 04/08/23 0247 04/09/23 0437 04/11/23 0432  NA 126* 126* 133* 133* 132*  K 4.1 3.9 3.7 3.5 3.7  CL 89* 90* 96* 94* 94*  CO2 29 28  27 29 29   GLUCOSE 145* 107* 117* 128* 128*  BUN 14 16 12 16 15   CREATININE 0.71 0.71 0.57 0.80 0.79  CALCIUM 8.8* 8.6* 8.8* 9.0 8.9  MG  --   --   --  1.7  --    GFR: Estimated Creatinine Clearance: 48.1 mL/min (by C-G formula based on SCr of 0.79 mg/dL). Liver Function Tests: Recent Labs  Lab 04/07/23 1455 04/08/23 0247 04/09/23 0437  AST 17 17 13*  ALT 15 14 14   ALKPHOS 68 61 58  BILITOT 0.6 1.0 0.8  PROT 6.3* 6.0* 6.0*  ALBUMIN 3.5 3.3* 3.1*   CBG: Recent Labs  Lab 04/10/23 1109 04/10/23 1353 04/11/23 0010 04/11/23 0547 04/11/23 0719  GLUCAP 146* 167* 182* 130* 132*    Thyroid Function  Tests: Recent Labs    04/08/23 1442  TSH 1.161   Anemia Panel:   Recent Results (from the past 240 hour(s))  Microscopic Examination     Status: Abnormal   Collection Time: 04/04/23  8:50 AM  Result Value Ref Range Status   WBC, UA 6-10 (A) 0 - 5 /hpf Final   RBC, Urine 0-2 0 - 2 /hpf Final   Epithelial Cells (non renal) 0-10 0 - 10 /hpf Final   Casts None seen None seen /lpf Final   Bacteria, UA None seen None seen/Few Final  Urine Culture, Reflex     Status: None   Collection Time: 04/04/23  8:50 AM  Result Value Ref Range Status   Urine Culture, Routine Final report  Final   Organism ID, Bacteria Comment  Final    Comment: Mixed urogenital flora Less than 10,000 colonies/mL   SARS Coronavirus 2 by RT PCR (hospital order, performed in [facility] hospital lab) *cepheid single result test* Anterior Nasal Swab     Status: None   Collection Time: 04/06/23  9:00 AM   Specimen: Anterior Nasal Swab  Result Value Ref Range Status   SARS Coronavirus 2 by RT PCR NEGATIVE NEGATIVE Final    Comment: (NOTE) SARS-CoV-2 target nucleic acids are NOT DETECTED.  The SARS-CoV-2 RNA is generally detectable in upper and lower respiratory specimens during the acute phase of infection. The lowest concentration of SARS-CoV-2 viral copies this assay can detect is 250 copies / mL. A  negative result does not preclude SARS-CoV-2 infection and should not be used as the sole basis for treatment or other patient management decisions.  A negative result may occur with improper specimen collection / handling, submission of specimen other than nasopharyngeal swab, presence of viral mutation(s) within the areas targeted by this assay, and inadequate number of viral copies (<250 copies / mL). A negative result must be combined with clinical observations, patient history, and epidemiological information.  Fact Sheet for Patients:   RoadLapTop.co.za  Fact Sheet for Healthcare Providers: http://kim-miller.com/  This test is not yet approved or  cleared by the [place] FDA and has been authorized for detection and/or diagnosis of SARS-CoV-2 by FDA under an Emergency Use Authorization (EUA).  This EUA will remain in effect (meaning this test can be used) for the duration of the COVID-19 declaration under Section 564(b)(1) of the Act, 21 U.S.C. section 360bbb-3(b)(1), unless the authorization is terminated or revoked sooner.  Performed at [facility], [address], [place], [place] [address]   SARS Coronavirus 2 by RT PCR (hospital order, performed in [facility] hospital lab) *cepheid single result test*     Status: None   Collection Time: 04/07/23  9:09 PM  Result Value Ref Range Status   SARS Coronavirus 2 by RT PCR NEGATIVE NEGATIVE Final    Comment: (NOTE) SARS-CoV-2 target nucleic acids are NOT DETECTED.  The SARS-CoV-2 RNA is generally detectable in upper and lower respiratory specimens during the acute phase of infection. The lowest concentration of SARS-CoV-2 viral copies this assay can detect is 250 copies / mL. A negative result does not preclude SARS-CoV-2 infection and should not be used as the sole basis for treatment or other patient management decisions.  A negative result may occur with improper specimen  collection / handling, submission of specimen other than nasopharyngeal swab, presence of viral mutation(s) within the areas targeted by this assay, and inadequate number of viral copies (<250 copies / mL). A negative result  must be combined with clinical observations, patient history, and epidemiological information.  Fact Sheet for Patients:   RoadLapTop.co.za  Fact Sheet for Healthcare Providers: http://kim-miller.com/  This test is not yet approved or  cleared by the [place] FDA and has been authorized for detection and/or diagnosis of SARS-CoV-2 by FDA under an Emergency Use Authorization (EUA).  This EUA will remain in effect (meaning this test can be used) for the duration of the COVID-19 declaration under Section 564(b)(1) of the Act, 21 U.S.C. section 360bbb-3(b)(1), unless the authorization is terminated or revoked sooner.  Performed at [facility], [address] [place], [place] [address]   MRSA Next Gen by PCR, Nasal     Status: None   Collection Time: 04/08/23  7:00 AM   Specimen: Nasal Mucosa; Nasal Swab  Result Value Ref Range Status   MRSA by PCR Next Gen NOT DETECTED NOT DETECTED Final    Comment: (NOTE) The GeneXpert MRSA Assay (FDA approved for NASAL specimens only), is one component of a comprehensive MRSA colonization surveillance program. It is not intended to diagnose MRSA infection nor to guide or monitor treatment for MRSA infections. Test performance is not FDA approved in patients less than 26 years old. Performed at [facility], [address], [place], [place] [address]          Radiology Studies: CT Angio Abd/Pel w/ and/or w/o  Result Date: 04/11/2023 CLINICAL DATA:  Mesenteric ischemia, acute. Follow-up stenosis on previous examination. History of abdominal pain and nausea. EXAM: CTA ABDOMEN AND PELVIS WITHOUT AND WITH CONTRAST TECHNIQUE: Multidetector CT imaging of the abdomen and pelvis  was performed using the standard protocol during bolus administration of intravenous contrast. Multiplanar reconstructed images and MIPs were obtained and reviewed to evaluate the vascular anatomy. RADIATION DOSE REDUCTION: This exam was performed according to the departmental dose-optimization program which includes automated exposure control, adjustment of the mA and/or kV according to patient size and/or use of iterative reconstruction technique. CONTRAST:  75mL OMNIPAQUE IOHEXOL 350 MG/ML SOLN COMPARISON:  03/07/2023 FINDINGS: VASCULAR Aorta: Atherosclerotic disease in the abdominal aorta without aneurysm, dissection or significant aortic stenosis. Celiac: Approximately 50% stenosis at the origin of the celiac artery trunk is similar to the previous examination. Main branch vessels are patent. SMA: Atherosclerotic disease involving the origin and proximal SMA without significant stenosis. Renals: Single right renal artery is patent with at least mild stenosis at the origin. Three left renal arteries. Moderate stenosis involving the origin of the superior left renal artery. No significant stenosis involving the middle left renal artery. There may be mild stenosis involving the inferior left renal artery. IMA: Patent with at least mild stenosis at the origin. Inflow: Common, internal and external iliac arteries are patent without significant stenosis. No evidence for aneurysm or dissection involving the iliac arteries. Proximal Outflow: Proximal femoral arteries are patent bilaterally. Veins: Portal venous system is patent. IVC and renal veins are patent. Iliac veins and proximal femoral veins are patent. Review of the MIP images confirms the above findings. NON-VASCULAR Lower chest: Bronchiectasis and atelectasis at the lung bases. Hepatobiliary: Stable 8 mm hypodensity in the right hepatic lobe on image 19/15 is likely an incidental finding. No suspicious hepatic lesions. No biliary dilatation. Gallbladder is  distended with multiple stones. No inflammatory changes around the gallbladder. Pancreas: Unremarkable. No pancreatic ductal dilatation or surrounding inflammatory changes. Spleen: Normal in size without focal abnormality. Adrenals/Urinary Tract: Adrenal glands are within normal limits. Small hypodensities in both kidneys that likely  represent small cysts and do not require dedicated follow-up. Urinary bladder is distended. Stomach/Bowel: Normal appearance of the stomach. Normal appearance of the small bowel without dilatation. Normal appearance of the colon. No evidence for bowel wall thickening or inflammation. No evidence for an obstructive process. Lymphatic: No lymph node enlargement in the abdomen or pelvis. Reproductive: Status post hysterectomy. No adnexal masses. Other: Negative for free fluid. Surgical mesh material along the anterior abdominal wall and anterior aspect of the pelvis. Musculoskeletal: No acute bone abnormality. IMPRESSION: VASCULAR 1. Main mesenteric arteries are patent. Approximately 50% stenosis at the origin of the celiac artery trunk. No significant stenosis involving the superior mesenteric artery. At least mild stenosis involving the origin of the inferior mesenteric artery. 2. Bilateral renal artery stenosis. Multiple left renal arteries as described. 3.  Aortic Atherosclerosis (ICD10-I70.0). NON-VASCULAR 1. No acute abnormality in the abdomen or pelvis. 2. Gallbladder is mildly distended with gallstones. No evidence for acute inflammation. 3. Distended urinary bladder without hydronephrosis. 4. Postoperative changes along the anterior abdominal wall with surgical mesh material. 5. Normal appearance of the bowel without evidence for obstruction or inflammatory changes. Electronically Signed   By: Richarda Overlie M.D.   On: 04/11/2023 07:31   DG CHEST PORT 1 VIEW  Result Date: 04/10/2023 CLINICAL DATA:  Cough and weakness. EXAM: PORTABLE CHEST 1 VIEW COMPARISON:  Chest radiographs  04/07/2023, 04/06/2023, 03/16/2023; CT chest 02/25/2021 FINDINGS: Cardiac silhouette is again moderately enlarged. Mediastinal contours are within normal limits. Mild calcification within aortic arch. Mild left-greater-than-right horizontal linear basilar densities appear slightly decreased on the right and unchanged on the left from most recent 04/07/2023 radiographs. Improved aeration of the right lung base. No definite pleural effusion. No pneumothorax. No acute skeletal abnormality. IMPRESSION: Improved aeration of the right lung base. Bibasilar horizontal linear likely scarring. It is again difficult to exclude mild left basilar retrocardiac airspace opacification, possible aspiration versus pneumonia as on recent 04/07/2023 radiograph. Electronically Signed   By: Neita Garnet M.D.   On: 04/10/2023 13:58        Scheduled Meds:  ALPRAZolam  0.25 mg Oral TID   enoxaparin (LOVENOX) injection  40 mg Subcutaneous Q24H   guaiFENesin-dextromethorphan  10 mL Oral Q8H   ipratropium  0.5 mg Nebulization Q6H   levalbuterol  1.25 mg Nebulization Q6H   methylPREDNISolone (SOLU-MEDROL) injection  40 mg Intravenous Daily   pantoprazole  40 mg Oral BID AC   sertraline  50 mg Oral Daily   Continuous Infusions:  azithromycin 500 mg (04/10/23 1738)   cefTRIAXone (ROCEPHIN)  IV Stopped (04/11/23 0647)     LOS: 4 days    Time spent: 55 minutes    Kendell Bane, MD [facility]  If 7PM-7AM, please contact night-coverage [email or web address] 04/11/2023, 10:54 AM

## 2023-04-11 NOTE — Consult Note (Signed)
Chief Complaint: Abdominal pain. Request is for mesenteric arteriogram with possible intervention  Referring Physician(s): Dr. Marjo Bicker  Supervising Physician: Marliss Coots  Patient Status: [facility] - Inpatient  History of Present Illness: [name] is a 89 y.o. female  inpatient. History of HTN, HLD, DM, chronic hyponatremia, diverticulitis. colonic perforation s/p hemicolectomy . Found to have proximal renal artery and mesenteric artery while being worked up for ED visit for weakness in early Sept. CT Angio Ad pelvis from 9.6.24 reads High-grade stenoses of the proximal renal arteries bilaterally, right greater than left. An accessory left renal artery feeds the lower pole of the left kidney without focal stenosis.High-grade stenosis at the origin of the inferior mesenteric artery.. Patient Presented to the ED at [facility] on 10.6.73 with cough, chills, myalgia and sore throat X 2-3 days. Found to have CAP. Hospital stay complicated by abdominal pain. EGD performed on 10.10.24  notes states Likely cause of abdominal pain and weight loss is mesenteric ischemia  Follow CT durning this admission from 10.10.24 reads  Main mesenteric arteries are patent. Approximately 50% stenosis at the origin of the celiac artery trunk. No significant stenosis involving the superior mesenteric artery. At least mild stenosis involving the origin of the inferior mesenteric artery.. Team is requesting mesenteric arteriogram with possible intervention for the mesenteric ischemia.   Patient alert and laying in bed,calm. Endorses fatigue. Denies any fevers, headache, chest pain, SOB, cough, abdominal pain, nausea, vomiting or bleeding. Return precautions and treatment recommendations and follow-up discussed with the patient  who is agreeable with the plan.      Past Medical History:  Diagnosis Date   Blood transfusion without reported diagnosis    Cognitive dysfunction 03/04/2019   Patient scores 21 out of 30  on [place] cognitive assessment September 2020   Diabetes mellitus without complication (HCC)    Diabetic peripheral neuropathy associated with type 2 diabetes mellitus (HCC) 02/25/2022   Diverticulitis    Frailty 03/04/2019   H/O bilateral breast reduction surgery    Hyperlipidemia    a. intolerant to statins.    Hypertension     Past Surgical History:  Procedure Laterality Date   ABDOMINAL HYSTERECTOMY  2003   APPENDECTOMY     age 63   BREAST REDUCTION SURGERY     age 71   COLON SURGERY Left 2011   Hemicolectomy due to diverticulitis   EYE SURGERY  03/30/2009   cataract   KNEE SURGERY Right 2003   knee cap   LAPAROSCOPIC INCISIONAL / UMBILICAL / VENTRAL HERNIA REPAIR  02/23/2007   REDUCTION MAMMAPLASTY Bilateral 2001   REFRACTIVE SURGERY     TOTAL KNEE ARTHROPLASTY Right 04/10/2022   Procedure: TOTAL KNEE ARTHROPLASTY;  Surgeon: Eugenia Mcalpine, MD;  Location: [facility];  Service: Orthopedics;  Laterality: Right;  adductor canal    Allergies: Hydrocortisone, Lisinopril, Phenergan [promethazine hcl], Statins, Trazodone and nefazodone, and Prednisone  Medications: Prior to Admission medications   Medication Sig Start Date End Date Taking? Authorizing Provider  acetaminophen (PAIN RELIEF EXTRA STRENGTH) 500 MG tablet TAKE (2) TABLETS BY MOUTH EVERY SIX HOURS A NEEDED FOR PAIN. Patient taking differently: Take 1,000 mg by mouth every 6 (six) hours as needed for moderate pain. 01/15/23  Yes Babs Sciara, MD  ALPRAZolam Prudy Feeler) 0.25 MG tablet Take 1 tablet (0.25 mg total) by mouth at bedtime as needed for sleep. 03/26/23  Yes Babs Sciara, MD  amLODipine (NORVASC) 10 MG tablet Take 1 tablet (10 mg total) by  mouth daily. 12/02/22  Yes Emokpae, Courage, MD  amoxicillin (AMOXIL) 500 MG capsule Take 500 mg by mouth once as needed (prophylactic). 1-2 hours prior to dental appointment   Yes [provider]  benzonatate (TESSALON) 100 MG capsule TAKE (1) CAPSULE BY MOUTH 3  TIMES DAILY AS NEEDED FOR COUGH. 11/21/22  Yes Luking, [name], MD  BETA CAROTENE PROVITAMIN A 16109 units capsule Take 25,000 Units by mouth daily. 01/01/23  Yes [provider]  BISACODYL 5 MG EC tablet TAKE 1 TABLET BY MOUTH ONCE DAILY AS NEEDED FOR MODERATE CONSTIPATION. Patient taking differently: Take 5 mg by mouth daily as needed for moderate constipation. 12/20/22  Yes Luking, [name], MD  COMBIVENT RESPIMAT 20-100 MCG/ACT AERS respimat INHALE (1) PUFF INTO THE LUNGS EVERY SIX HOURS AS NEEDED FOR WHEEZING OR SHORTNESS OF BREATH. Patient taking differently: Inhale 1 puff into the lungs every 6 (six) hours as needed for wheezing or shortness of breath. 11/21/22  Yes Luking, [name], MD  cyanocobalamin 1000 MCG tablet Take 1,000 mcg by mouth at bedtime.   Yes [provider]  dicyclomine (BENTYL) 10 MG capsule TAKE 1 CAPSULE BY MOUTH TWICE DAILY AS NEEDED FOR ABDOMINAL CRAMPS AND LOOSE STOOL. Patient taking differently: Take 10 mg by mouth 2 (two) times daily as needed (abdominal cramps and loose stools.). TAKE 1 CAPSULE BY MOUTH TWICE DAILY AS NEEDED FOR ABDOMINAL CRAMPS AND LOOSE STOOL. 11/21/22  Yes Luking, [name], MD  gabapentin (NEURONTIN) 100 MG capsule 2 po bid Patient taking differently: Take 200 mg by mouth 2 (two) times daily. 11/19/22  Yes Luking, [name], MD  GOODSENSE HEMORRHOIDAL 0.25-14-74.9 % rectal ointment Place 1 Application rectally 3 (three) times daily as needed for irritation. 12/26/21  Yes [provider]  Iron, Ferrous Sulfate, 325 (65 Fe) MG TABS Take 325 mg by mouth every other day. 02/20/23  Yes Luking, [name], MD  IVIZIA DRY EYES 0.5 % SOLN Place 1 drop into both eyes in the morning, at noon, in the evening, and at bedtime. 03/27/23  Yes [provider]  loperamide (ANTI-DIARRHEAL) 2 MG tablet TAKE 1 TABLET BY MOUTH AS NEEDED FOR DIARRHEA OR LOOSE STOOL**MAX 4 TABLETS IN 24 HOURS** Patient taking differently: Take 2 mg by mouth 4 (four)  times daily as needed for diarrhea or loose stools. 12/27/22  Yes Babs Sciara, MD  loratadine (CLARITIN) 10 MG tablet Take 10 mg by mouth daily.   Yes [provider]  losartan (COZAAR) 100 MG tablet Take 1 tablet daily Patient taking differently: Take 100 mg by mouth daily. 12/02/22  Yes Shon Hale, MD  meclizine (ANTIVERT) 25 MG tablet Take 1 tablet (25 mg total) by mouth 3 (three) times daily as needed for dizziness or nausea. 01/24/23  Yes Luking, Scott A, MD  melatonin 3 MG TABS tablet Take 3 mg by mouth at bedtime.   Yes [provider]  metFORMIN (GLUCOPHAGE) 500 MG tablet Take one tablet 500mg  po in the morning and 1/2 tablet 250 mg po at supper Patient taking differently: Take 750 mg by mouth daily. Take one tablet 500mg  po in the morning and 1/2 tablet 250 mg po at supper 12/02/22  Yes Emokpae, Courage, MD  Multiple Vitamins-Minerals (PRESERVISION AREDS 2) CAPS Take 1 capsule by mouth in the morning and at bedtime.   Yes [provider]  Omega-3 Fatty Acids (FISH OIL) 1000 MG CAPS One capsule BID Patient taking differently: Take 1 capsule by mouth in  the morning and at bedtime. 08/07/21  Yes Luking, [name], MD  pantoprazole (PROTONIX) 40 MG tablet Take 1 tablet (40 mg total) by mouth daily. 02/11/23  Yes Babs Sciara, MD  prochlorperazine (COMPAZINE) 10 MG tablet Take 1 tablet (10 mg total) by mouth every 6 (six) hours as needed for nausea or vomiting. 04/06/23  Yes Emokpae, Courage, MD  RESTASIS 0.05 % ophthalmic emulsion Place 1 drop into both eyes 2 (two) times daily. 04/03/23  Yes [provider]  Vitamin D, Cholecalciferol, 10 MCG (400 UNIT) TABS TAKE 1 TABLET BY MOUTH ONCE DAILY. 05/12/20  Yes Luking, [name], MD  EASYMAX TEST test strip CHECK BLOOD SUGAR ONCE DAILY.(CALL MD IF BS BELOW 60: OR IF BS ABOVE 400) 02/03/23   Luking, [name], MD  sertraline (ZOLOFT) 25 MG tablet Take 25 mg by mouth daily. 03/27/23 04/03/23  [provider]      Family History  Problem Relation Age of Onset   Cancer Mother        ovaian   Hypertension Father        kidney   Stroke Father    Cancer Father    Hypertension Sister    Diabetes Brother     Social History   Socioeconomic History   Marital status: Widowed    Spouse name: Not on file   Number of children: Not on file   Years of education: Not on file   Highest education level: Not on file  Occupational History   Not on file  Tobacco Use   Smoking status: Never   Smokeless tobacco: Never  Vaping Use   Vaping status: Never Used  Substance and Sexual Activity   Alcohol use: No   Drug use: No   Sexual activity: Never    Birth control/protection: Abstinence  Other Topics Concern   Not on file  Social History Narrative   Not on file   Social Determinants of Health   Financial Resource Strain: Low Risk  (09/06/2022)   Overall Financial Resource Strain (CARDIA)    Difficulty of Paying Living Expenses: Not hard at all  Food Insecurity: No Food Insecurity (04/08/2023)   Hunger Vital Sign    Worried About Running Out of Food in the Last Year: Never true    Ran Out of Food in the Last Year: Never true  Transportation Needs: No Transportation Needs (04/08/2023)   PRAPARE - Administrator, Civil Service (Medical): No    Lack of Transportation (Non-Medical): No  Physical Activity: Sufficiently Active (09/06/2022)   Exercise Vital Sign    Days of Exercise per Week: 5 days    Minutes of Exercise per Session: 30 min  Stress: No Stress Concern Present (09/06/2022)   [employer] of Occupational Health - Occupational Stress Questionnaire    Feeling of Stress : Not at all  Social Connections: Moderately Isolated (09/06/2022)   Social Connection and Isolation Panel [NHANES]    Frequency of Communication with Friends and Family: More than three times a week    Frequency of Social Gatherings with Friends and Family: More than three times a week    Attends Religious  Services: 1 to 4 times per year    Active Member of [employer] or Organizations: No    Attends Banker Meetings: Never    Marital Status: Widowed    Review of Systems: A 12 point ROS discussed and pertinent positives are indicated in the HPI above.  All other  systems are negative.  Review of Systems  Constitutional:  Positive for fatigue. Negative for fever.  HENT:  Negative for congestion.   Respiratory:  Negative for cough and shortness of breath.   Gastrointestinal:  Negative for abdominal pain, diarrhea, nausea and vomiting.    Vital Signs: BP (!) 146/62 (BP Location: Right Arm)   Pulse 70   Temp 98.2 F (36.8 C)   Resp 20   Ht 5\' 8"  (1.727 m)   Wt 160 lb 0.9 oz (72.6 kg)   SpO2 98%   BMI 24.34 kg/m   Advance Care Plan: The advanced care plan/surrogate decision maker was discussed at the time of visit and documented in the medical record.  Son Barbara Cower    Physical Exam Vitals and nursing note reviewed.  Constitutional:      Appearance: She is well-developed.  HENT:     Head: Normocephalic and atraumatic.     Mouth/Throat:     Mouth: Mucous membranes are moist.  Eyes:     Conjunctiva/sclera: Conjunctivae normal.  Cardiovascular:     Rate and Rhythm: Normal rate and regular rhythm.  Pulmonary:     Breath sounds: Normal breath sounds.  Musculoskeletal:        General: Normal range of motion.     Cervical back: Normal range of motion.  Skin:    General: Skin is warm and dry.  Neurological:     General: No focal deficit present.     Mental Status: She is alert and oriented to person, place, and time.  Psychiatric:        Mood and Affect: Mood normal.        Behavior: Behavior normal.     Imaging: CT Angio Abd/Pel w/ and/or w/o  Result Date: 04/11/2023 CLINICAL DATA:  Mesenteric ischemia, acute. Follow-up stenosis on previous examination. History of abdominal pain and nausea. EXAM: CTA ABDOMEN AND PELVIS WITHOUT AND WITH CONTRAST TECHNIQUE:  Multidetector CT imaging of the abdomen and pelvis was performed using the standard protocol during bolus administration of intravenous contrast. Multiplanar reconstructed images and MIPs were obtained and reviewed to evaluate the vascular anatomy. RADIATION DOSE REDUCTION: This exam was performed according to the departmental dose-optimization program which includes automated exposure control, adjustment of the mA and/or kV according to patient size and/or use of iterative reconstruction technique. CONTRAST:  75mL OMNIPAQUE IOHEXOL 350 MG/ML SOLN COMPARISON:  03/07/2023 FINDINGS: VASCULAR Aorta: Atherosclerotic disease in the abdominal aorta without aneurysm, dissection or significant aortic stenosis. Celiac: Approximately 50% stenosis at the origin of the celiac artery trunk is similar to the previous examination. Main branch vessels are patent. SMA: Atherosclerotic disease involving the origin and proximal SMA without significant stenosis. Renals: Single right renal artery is patent with at least mild stenosis at the origin. Three left renal arteries. Moderate stenosis involving the origin of the superior left renal artery. No significant stenosis involving the middle left renal artery. There may be mild stenosis involving the inferior left renal artery. IMA: Patent with at least mild stenosis at the origin. Inflow: Common, internal and external iliac arteries are patent without significant stenosis. No evidence for aneurysm or dissection involving the iliac arteries. Proximal Outflow: Proximal femoral arteries are patent bilaterally. Veins: Portal venous system is patent. IVC and renal veins are patent. Iliac veins and proximal femoral veins are patent. Review of the MIP images confirms the above findings. NON-VASCULAR Lower chest: Bronchiectasis and atelectasis at the lung bases. Hepatobiliary: Stable 8 mm hypodensity in the right hepatic  lobe on image 19/15 is likely an incidental finding. No suspicious  hepatic lesions. No biliary dilatation. Gallbladder is distended with multiple stones. No inflammatory changes around the gallbladder. Pancreas: Unremarkable. No pancreatic ductal dilatation or surrounding inflammatory changes. Spleen: Normal in size without focal abnormality. Adrenals/Urinary Tract: Adrenal glands are within normal limits. Small hypodensities in both kidneys that likely represent small cysts and do not require dedicated follow-up. Urinary bladder is distended. Stomach/Bowel: Normal appearance of the stomach. Normal appearance of the small bowel without dilatation. Normal appearance of the colon. No evidence for bowel wall thickening or inflammation. No evidence for an obstructive process. Lymphatic: No lymph node enlargement in the abdomen or pelvis. Reproductive: Status post hysterectomy. No adnexal masses. Other: Negative for free fluid. Surgical mesh material along the anterior abdominal wall and anterior aspect of the pelvis. Musculoskeletal: No acute bone abnormality. IMPRESSION: VASCULAR 1. Main mesenteric arteries are patent. Approximately 50% stenosis at the origin of the celiac artery trunk. No significant stenosis involving the superior mesenteric artery. At least mild stenosis involving the origin of the inferior mesenteric artery. 2. Bilateral renal artery stenosis. Multiple left renal arteries as described. 3.  Aortic Atherosclerosis (ICD10-I70.0). NON-VASCULAR 1. No acute abnormality in the abdomen or pelvis. 2. Gallbladder is mildly distended with gallstones. No evidence for acute inflammation. 3. Distended urinary bladder without hydronephrosis. 4. Postoperative changes along the anterior abdominal wall with surgical mesh material. 5. Normal appearance of the bowel without evidence for obstruction or inflammatory changes. Electronically Signed   By: Richarda Overlie M.D.   On: 04/11/2023 07:31   DG CHEST PORT 1 VIEW  Result Date: 04/10/2023 CLINICAL DATA:  Cough and weakness. EXAM:  PORTABLE CHEST 1 VIEW COMPARISON:  Chest radiographs 04/07/2023, 04/06/2023, 03/16/2023; CT chest 02/25/2021 FINDINGS: Cardiac silhouette is again moderately enlarged. Mediastinal contours are within normal limits. Mild calcification within aortic arch. Mild left-greater-than-right horizontal linear basilar densities appear slightly decreased on the right and unchanged on the left from most recent 04/07/2023 radiographs. Improved aeration of the right lung base. No definite pleural effusion. No pneumothorax. No acute skeletal abnormality. IMPRESSION: Improved aeration of the right lung base. Bibasilar horizontal linear likely scarring. It is again difficult to exclude mild left basilar retrocardiac airspace opacification, possible aspiration versus pneumonia as on recent 04/07/2023 radiograph. Electronically Signed   By: Neita Garnet M.D.   On: 04/10/2023 13:58   DG Chest Portable 1 View  Result Date: 04/07/2023 CLINICAL DATA:  Shortness of breath EXAM: PORTABLE CHEST 1 VIEW COMPARISON:  Chest x-ray dated April 06, 2023 FINDINGS: Patient rotation to the right somewhat limits evaluation. Cardiac and mediastinal contours are within normal limits when accounting for patient rotation. Increased right greater than left basilar opacities. No large pleural effusion or evidence of pneumothorax. IMPRESSION: Increased right greater than left basilar opacities, concerning for infection or aspiration. Recommend follow-up PA and lateral chest radiograph in 6-8 weeks to ensure resolution. Electronically Signed   By: Allegra Lai M.D.   On: 04/07/2023 15:55   DG Chest Portable 1 View  Result Date: 04/06/2023 CLINICAL DATA:  Cough.  COVID positive. EXAM: PORTABLE CHEST 1 VIEW COMPARISON:  03/16/2023 FINDINGS: AP portable radiograph with mild apical lordotic positioning. Numerous leads and wires project over the chest. Midline trachea. Mild cardiomegaly. Atherosclerosis in the transverse aorta. No pleural effusion or  pneumothorax. Medial right lung base opacity is similar and likely due to scarring. There may be increased density along the left heart border and lateral left hemidiaphragm. No  congestive failure. IMPRESSION: Possible increased density along the left heart border and lateral left hemidiaphragm. Cannot exclude left base airspace disease. Consider further evaluation with PA and lateral radiographs. Cardiomegaly without congestive failure. Aortic Atherosclerosis (ICD10-I70.0). Electronically Signed   By: Jeronimo Greaves M.D.   On: 04/06/2023 10:01   DG Chest Portable 1 View  Result Date: 03/16/2023 CLINICAL DATA:  Pt reported urinary frequency and not fully emptying bladder, with abdominal pressure. Recently seen for UTI and given antibiotics. EXAM: PORTABLE CHEST 1 VIEW COMPARISON:  03/07/2023 and older studies.  CT, 02/25/2021. FINDINGS: Cardiac silhouette mildly enlarged.  No mediastinal or hilar masses. Lungs are hyperexpanded. Mild linear atelectasis or scarring noted in the right lung base. No lung consolidation or edema. No convincing pleural effusion and no pneumothorax. Skeletal structures are demineralized, grossly intact. IMPRESSION: No acute cardiopulmonary disease. Electronically Signed   By: Amie Portland M.D.   On: 03/16/2023 11:56    Labs:  CBC: Recent Labs    04/06/23 0940 04/07/23 1455 04/09/23 0437 04/11/23 0432  WBC 9.6 7.2 8.8 8.6  HGB 12.4 11.7* 11.3* 10.5*  HCT 36.6 35.8* 35.4* 32.6*  PLT 249 245 275 277    COAGS: No results for input(s): "INR", "APTT" in the last 8760 hours.  BMP: Recent Labs    04/07/23 1455 04/08/23 0247 04/09/23 0437 04/11/23 0432  NA 126* 133* 133* 132*  K 3.9 3.7 3.5 3.7  CL 90* 96* 94* 94*  CO2 28 27 29 29   GLUCOSE 107* 117* 128* 128*  BUN 16 12 16 15   CALCIUM 8.6* 8.8* 9.0 8.9  CREATININE 0.71 0.57 0.80 0.79  GFRNONAA >60 >60 >60 >60    LIVER FUNCTION TESTS: Recent Labs    03/22/23 1755 04/07/23 1455 04/08/23 0247  04/09/23 0437  BILITOT 1.0 0.6 1.0 0.8  AST 22 17 17  13*  ALT 15 15 14 14   ALKPHOS 76 68 61 58  PROT 6.9 6.3* 6.0* 6.0*  ALBUMIN 4.0 3.5 3.3* 3.1*     Assessment and Plan:  89 y.o. female inpatient. History of HTN, HLD, DM, chronic hyponatremia, diverticulitis. colonic perforation s/p hemicolectomy . Found to have proximal renal artery and mesenteric artery while being worked up for ED visit for weakness in early Sept. CT Angio Ad pelvis from 9.6.24 reads High-grade stenoses of the proximal renal arteries bilaterally, right greater than left. An accessory left renal artery feeds the lower pole of the left kidney without focal stenosis.High-grade stenosis at the origin of the inferior mesenteric artery.. Patient Presented to the ED at [facility]73 with cough, chills, myalgia and sore throat X 2-3 days. Found to have CAP. Hospital stay complicated by abdominal pain. EGD performed on 10.10.24  notes states Likely cause of abdominal pain and weight loss is mesenteric ischemia  Follow CT during this admission from 10.10.24 reads  Main mesenteric arteries are patent. Approximately 50% stenosis at the origin of the celiac artery trunk. No significant stenosis involving the superior mesenteric artery. At least mild stenosis involving the origin of the inferior mesenteric artery.. Team is requesting mesenteric arteriogram with possible intervention for the mesenteric ischemia. Procedure approved by IR Attending Dr. Andrey Campanile. Plan is for patient to be returned to [facility] post procedure. Patient will need to be moved to a bed that can accept an arterial puncture. Team made aware and is in agreement with the plan of care.   All labs and medications are within acceptable parameters. No pertinent allergies. Patient has been NPO.  Advance Care Plan: The advanced care plan/surrogate decision maker was discussed at the time of visit and documented in the medical record. Patient desires DNR  Risks and benefits of   mesenteric a arteriogram with intervention were discussed with the patient including, but not limited to bleeding, infection, vascular injury, contrast induced renal failure, stroke, reperfusion hemorrhage, or even death. This interventional procedure involves the use of X-rays and because of the nature of the planned procedure, it is possible that we will have prolonged use of X-ray fluoroscopy. Potential radiation risks to you include (but are not limited to) the following: - A slightly elevated risk for cancer  several years later in life. This risk is typically less than 0.5% percent. This risk is low in comparison to the normal incidence of human cancer, which is 33% for women and 50% for men according to the American Cancer Society. - Radiation induced injury can include skin redness, resembling a rash, tissue breakdown / ulcers and hair loss (which can be temporary or permanent).  The likelihood of either of these occurring depends on the difficulty of the procedure and whether you are sensitive to radiation due to previous procedures, disease, or genetic conditions.  IF your procedure requires a prolonged use of radiation, you will be notified and given written instructions for further action.  It is your responsibility to monitor the irradiated area for the 2 weeks following the procedure and to notify your physician if you are concerned that you have suffered a radiation induced injury.   All of the patient's questions were answered, patient is agreeable to proceed. Consent signed and in chart.     Thank you for this interesting consult.  I greatly enjoyed meeting [name] and look forward to participating in their care.  A copy of this report was sent to the requesting provider on this date.  Electronically Signed: Alene Mires, NP 04/11/2023, 8:32 AM   I spent a total of 40 Minutes    in face to face in clinical consultation, greater than 50% of which was  counseling/coordinating care for mesenteric arteriogram with possible embolization

## 2023-04-12 DIAGNOSIS — K551 Chronic vascular disorders of intestine: Secondary | ICD-10-CM | POA: Diagnosis not present

## 2023-04-12 LAB — BASIC METABOLIC PANEL
Anion gap: 10 (ref 5–15)
BUN: 14 mg/dL (ref 8–23)
CO2: 29 mmol/L (ref 22–32)
Calcium: 9 mg/dL (ref 8.9–10.3)
Chloride: 98 mmol/L (ref 98–111)
Creatinine, Ser: 0.74 mg/dL (ref 0.44–1.00)
GFR, Estimated: >60 mL/min (ref >60)
Glucose, Bld: 146 mg/dL — ABNORMAL HIGH (ref 70–99)
Potassium: 4.4 mmol/L (ref 3.5–5.1)
Sodium: 137 mmol/L (ref 135–145)

## 2023-04-12 LAB — CBC
HCT: 34 % — ABNORMAL LOW (ref 36.0–46.0)
Hemoglobin: 10.7 g/dL — ABNORMAL LOW (ref 12.0–15.0)
MCH: 29 pg (ref 26.0–34.0)
MCHC: 31.5 g/dL (ref 30.0–36.0)
MCV: 92.1 fL (ref 80.0–100.0)
Platelets: 237 10*3/uL (ref 150–400)
RBC: 3.69 MIL/uL — ABNORMAL LOW (ref 3.87–5.11)
RDW: 13.8 % (ref 11.5–15.5)
WBC: 6.1 10*3/uL (ref 4.0–10.5)
nRBC: 0 % (ref 0.0–0.2)

## 2023-04-12 LAB — GLUCOSE, CAPILLARY
Glucose-Capillary: 138 mg/dL — ABNORMAL HIGH (ref 70–99)
Glucose-Capillary: 179 mg/dL — ABNORMAL HIGH (ref 70–99)
Glucose-Capillary: 181 mg/dL — ABNORMAL HIGH (ref 70–99)
Glucose-Capillary: 127 mg/dL — ABNORMAL HIGH (ref 70–99)

## 2023-04-12 MED ORDER — IPRATROPIUM BROMIDE 0.02 % IN SOLN
0.5000 mg | Freq: Four times a day (QID) | RESPIRATORY_TRACT | Status: DC
  Administered 2023-04-12 – 2023-04-14 (×8): 0.5 mg via RESPIRATORY_TRACT
  Filled 2023-04-12 (×8): qty 2.5

## 2023-04-12 MED ORDER — MELATONIN 3 MG PO TABS
6.0000 mg | ORAL_TABLET | Freq: Every evening | ORAL | Status: DC | PRN
  Administered 2023-04-12 – 2023-04-14 (×3): 6 mg via ORAL
  Filled 2023-04-12 (×3): qty 2

## 2023-04-12 MED ORDER — AMLODIPINE BESYLATE 5 MG PO TABS
10.0000 mg | ORAL_TABLET | Freq: Every day | ORAL | Status: DC
  Administered 2023-04-13 – 2023-04-15 (×3): 10 mg via ORAL
  Filled 2023-04-12 (×3): qty 2

## 2023-04-12 MED ORDER — AMLODIPINE BESYLATE 5 MG PO TABS
5.0000 mg | ORAL_TABLET | Freq: Every day | ORAL | Status: DC
  Administered 2023-04-12: 5 mg via ORAL
  Filled 2023-04-12: qty 1

## 2023-04-12 MED ORDER — LEVALBUTEROL HCL 1.25 MG/0.5ML IN NEBU
1.2500 mg | INHALATION_SOLUTION | Freq: Four times a day (QID) | RESPIRATORY_TRACT | Status: DC
  Administered 2023-04-12 – 2023-04-14 (×8): 1.25 mg via RESPIRATORY_TRACT
  Filled 2023-04-12 (×8): qty 0.5

## 2023-04-12 MED ORDER — POLYVINYL ALCOHOL 1.4 % OP SOLN
1.0000 [drp] | Freq: Three times a day (TID) | OPHTHALMIC | Status: DC | PRN
  Administered 2023-04-13: 1 [drp] via OPHTHALMIC

## 2023-04-12 NOTE — TOC Progression Note (Signed)
Transition of Care [facility]) - Progression Note    Patient Details  Name: [name] MRN: [id number] Date of Birth: [birth year]  Transition of Care [facility]) CM/SW Contact  Catalina Gravel, LCSW Phone Number: 04/12/2023, 1:28 PM  Clinical Narrative:     CSW met with pt at bedside.  Pt agrees with SNF recommendation.  Pt prefers [facility] and has been there in the past. Pt was engaging and reflecting on her life and the Dr. who she has come to know. Patient interested in rehab at [facility].  Bed request sent.  TOC to follow.   Expected Discharge Plan: Skilled Nursing Facility Barriers to Discharge: Continued Medical Work up  Expected Discharge Plan and Services In-house Referral: Clinical Social Work   Post Acute Care Choice: Skilled Nursing Facility Living arrangements for the past 2 months: Assisted Living Facility                                       Social Determinants of Health (SDOH) Interventions SDOH Screenings   Food Insecurity: No Food Insecurity (04/08/2023)  Housing: Patient Declined (04/08/2023)  Transportation Needs: No Transportation Needs (04/08/2023)  Utilities: Not At Risk (04/08/2023)  Alcohol Screen: Low Risk  (09/06/2022)  Depression (PHQ2-9): Low Risk  (01/08/2023)  Financial Resource Strain: Low Risk  (09/06/2022)  Physical Activity: Sufficiently Active (09/06/2022)  Social Connections: Moderately Isolated (09/06/2022)  Stress: No Stress Concern Present (09/06/2022)  Tobacco Use: Low Risk  (04/08/2023)    Readmission Risk Interventions    04/08/2023    2:11 PM  Readmission Risk Prevention Plan  Transportation Screening Complete  PCP or Specialist Appt within 3-5 Days Not Complete  HRI or Home Care Consult Complete  Social Work Consult for Recovery Care Planning/Counseling Complete  Palliative Care Screening Not Applicable  Medication Review Oceanographer) Complete

## 2023-04-12 NOTE — Progress Notes (Signed)
PROGRESS NOTE    Linda STANFORTH  ZDG:[id number] DOB: 1931/10/03 DOA: 04/07/2023 PCP: Babs Sciara, MD   Brief Narrative:    [name] is a vibrant 89 y.o. 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 knee replacement in 2023 who was walking a mile a day at the end of last year.  Earlier this year she was down to half a mile a day.  In the last 3 months the patient has been plagued with intermittent diarrhea, poor appetite, and progressive weakness.  She appears to have hyponatremia that is improving as well as community-acquired pneumonia seen on chest x-ray.  She is also noted to have increasing weight loss and malaise that may be related to mesenteric ischemia.  GI asked to evaluate for this condition to see if IR intervention could be beneficial.  Palliative also consulted for further goals of care discussion.    Subjective: The patient was seen and examined this morning,  Hemodynamically stable Awake alert oriented x 3, cooperative, in no acute distress Denies any abdominal pain nausea vomiting  Was monitored postprocedure in ICU overnight  S/p treatment of SMA stenosis with angiography and stenting yesterday at [facility]  Assessment & Plan:   Principal Problem:   Mesenteric artery stenosis [facility]) Active Problems:   Abdominal pain, chronic, epigastric   Gastritis and gastroduodenitis  Assessment and Plan:  Abdominal pain Due to mesenteric ischemia with high-grade stenosis to SMA as 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 Marliss Coots   -Improved abdominal pain, advancing diet slowly today -GI following  -Status post repeat CTA, revealing. Approximately 50% stenosis at the origin of the celiac artery trunk. No significant stenosis involving  the superior mesenteric artery. At least mild stenosis involving the origin of the inferior mesenteric artery. 2. Bilateral renal artery stenosis. Multiple left renal arteries as described.  -Discussed the case with GI and IR Dr. Elby Showers on 04/11/2023  -On PPI added ASA    -POA  postprandial Pain, burning sensation, 8 pound weight loss   Community-acquired pneumonia Improving cough, shortness of breath, wheezing -On  IV Solu-Medrol -planning for taper down prednisone -  breathing treatment DuoNeb bronchodilators -No signs of SIRS /sepsis ruled out -Continue azithromycin and Rocephin >> switching to p.o. antibiotics in a.m. -Legionella neg  Hyponatremia-improving Serum sodium level 126, 133 >>> 137  -Improved with IVF     Recurrent diarrhea-, resolved -Monitoring BMs -Start fiber supplementation and hold antimotility agents  Concern for failure to thrive -Appears to have poor long-term prognosis and does not appear to want aggressive measures -Appreciate palliative consultation   Depression/withdrawal -Patient Zoloft was held, will be resumed post EGD Continue as needed Xanax     DVT prophylaxis:Lovenox Code Status: DNR Family Communication: Discussed with her son at bedside Disposition Plan:  Status is: Inpatient Remains inpatient appropriate because: Need for IV medications and inpt procedure Disposition-likely SNF family requested [facility] consulted  Consultants:  GI Palliative  Procedures:  CTA   Antimicrobials:  Anti-infectives (From admission, onward)    Start     Dose/Rate Route Frequency Ordered Stop   04/08/23 1600  azithromycin (ZITHROMAX) 500 mg in sodium chloride 0.9 % 250 mL IVPB        500 mg 250 mL/hr over 60 Minutes Intravenous Every 24  hours 04/07/23 2043     04/08/23 0530  cefTRIAXone (ROCEPHIN) 2 g in sodium chloride 0.9 % 100 mL IVPB        2 g 200 mL/hr over 30 Minutes Intravenous Every 24 hours 04/07/23 2042     04/07/23  1545  cefTRIAXone (ROCEPHIN) 1 g in sodium chloride 0.9 % 100 mL IVPB        1 g 200 mL/hr over 30 Minutes Intravenous  Once 04/07/23 1535 04/07/23 1618   04/07/23 1545  azithromycin (ZITHROMAX) 500 mg in sodium chloride 0.9 % 250 mL IVPB        500 mg 250 mL/hr over 60 Minutes Intravenous  Once 04/07/23 1535 04/07/23 1724        Objective: Vitals:   04/12/23 0745 04/12/23 0800 04/12/23 0843 04/12/23 1112  BP:      Pulse: 85 78  76  Resp: 18 16  20   Temp:  97.7 F (36.5 C)  98.3 F (36.8 C)  TempSrc:  Oral  Oral  SpO2: 98% 97% 91% (!) 89%  Weight:      Height:        Intake/Output Summary (Last 24 hours) at 04/12/2023 1217 Last data filed at 04/12/2023 0600 Gross per 24 hour  Intake 807.55 ml  Output 1100 ml  Net -292.45 ml    Filed Weights   04/08/23 0637 04/11/23 1800  Weight: 72.6 kg 74.1 kg      General:  AAO x 3,  cooperative, no distress;   HEENT:  Normocephalic, PERRL, otherwise with in Normal limits   Neuro:  CNII-XII intact. , normal motor and sensation, reflexes intact   Lungs:   Clear to auscultation BL, Respirations unlabored,  No wheezes / crackles  Cardio:    S1/S2, RRR, No murmure, No Rubs or Gallops   Abdomen:  Soft, non-tender, bowel sounds active all four quadrants, no guarding or peritoneal signs.  Muscular  skeletal:  Limited exam -global generalized weaknesses - in bed, able to move all 4 extremities,   2+ pulses,  symmetric, No pitting edema  Skin:  Dry, warm to touch, negative for any Rashes,  Wounds: Please see nursing documentation          Data Reviewed: I have personally reviewed following labs and imaging studies  CBC: Recent Labs  Lab 04/06/23 0940 04/07/23 1455 04/09/23 0437 04/11/23 0432 04/12/23 0535  WBC 9.6 7.2 8.8 8.6 6.1  NEUTROABS 7.9* 4.7  --   --   --   HGB 12.4 11.7* 11.3* 10.5* 10.7*  HCT 36.6 35.8* 35.4* 32.6* 34.0*  MCV 88.0 88.8 90.3 91.1 92.1  PLT 249 245 275 277 237   Basic Metabolic  Panel: Recent Labs  Lab 04/07/23 1455 04/08/23 0247 04/09/23 0437 04/11/23 0432 04/12/23 0535  NA 126* 133* 133* 132* 137  K 3.9 3.7 3.5 3.7 4.4  CL 90* 96* 94* 94* 98  CO2 28 27 29 29 29   GLUCOSE 107* 117* 128* 128* 146*  BUN 16 12 16 15 14   CREATININE 0.71 0.57 0.80 0.79 0.74  CALCIUM 8.6* 8.8* 9.0 8.9 9.0  MG  --   --  1.7  --   --    GFR: Estimated Creatinine Clearance: 48.1 mL/min (by C-G formula based on SCr of 0.74 mg/dL). Liver Function Tests: Recent Labs  Lab 04/07/23 1455 04/08/23 0247 04/09/23 0437  AST 17 17 13*  ALT 15 14 14   ALKPHOS 68 61 58  BILITOT 0.6 1.0 0.8  PROT 6.3* 6.0* 6.0*  ALBUMIN 3.5 3.3* 3.1*   CBG: Recent Labs  Lab 04/11/23 0547 04/11/23 0719 04/11/23 1755 04/11/23 2349 04/12/23 0518  GLUCAP 130* 132* 152* 131* 138*    Thyroid Function Tests: No results for input(s): "TSH", "T4TOTAL", "FREET4", "T3FREE", "THYROIDAB" in the last 72 hours.  Anemia Panel:   Recent Results (from the past 240 hour(s))  Microscopic Examination     Status: Abnormal   Collection Time: 04/04/23  8:50 AM  Result Value Ref Range Status   WBC, UA 6-10 (A) 0 - 5 /hpf Final   RBC, Urine 0-2 0 - 2 /hpf Final   Epithelial Cells (non renal) 0-10 0 - 10 /hpf Final   Casts None seen None seen /lpf Final   Bacteria, UA None seen None seen/Few Final  Urine Culture, Reflex     Status: None   Collection Time: 04/04/23  8:50 AM  Result Value Ref Range Status   Urine Culture, Routine Final report  Final   Organism ID, Bacteria Comment  Final    Comment: Mixed urogenital flora Less than 10,000 colonies/mL   SARS Coronavirus 2 by RT PCR (hospital order, performed in [facility] hospital lab) *cepheid single result test* Anterior Nasal Swab     Status: None   Collection Time: 04/06/23  9:00 AM   Specimen: Anterior Nasal Swab  Result Value Ref Range Status   SARS Coronavirus 2 by RT PCR NEGATIVE NEGATIVE Final    Comment: (NOTE) SARS-CoV-2 target nucleic acids  are NOT DETECTED.  The SARS-CoV-2 RNA is generally detectable in upper and lower respiratory specimens during the acute phase of infection. The lowest concentration of SARS-CoV-2 viral copies this assay can detect is 250 copies / mL. A negative result does not preclude SARS-CoV-2 infection and should not be used as the sole basis for treatment or other patient management decisions.  A negative result may occur with improper specimen collection / handling, submission of specimen other than nasopharyngeal swab, presence of viral mutation(s) within the areas targeted by this assay, and inadequate number of viral copies (<250 copies / mL). A negative result must be combined with clinical observations, patient history, and epidemiological information.  Fact Sheet for Patients:   RoadLapTop.co.za  Fact Sheet for Healthcare Providers: http://kim-miller.com/  This test is not yet approved or  cleared by the [place] FDA and has been authorized for detection and/or diagnosis of SARS-CoV-2 by FDA under an Emergency Use Authorization (EUA).  This EUA will remain in effect (meaning this test can be used) for the duration of the COVID-19 declaration under Section 564(b)(1) of the Act, 21 U.S.C. section 360bbb-3(b)(1), unless the authorization is terminated or revoked sooner.  Performed at [facility], [address], [place], [place] [address]   SARS Coronavirus 2 by RT PCR (hospital order, performed in [facility] hospital lab) *cepheid single result test*     Status: None   Collection Time: 04/07/23  9:09 PM  Result Value Ref Range Status   SARS Coronavirus 2 by RT PCR NEGATIVE NEGATIVE Final    Comment: (NOTE) SARS-CoV-2 target nucleic acids are NOT DETECTED.  The SARS-CoV-2 RNA is generally detectable in upper and lower respiratory specimens during the acute phase of infection. The lowest concentration of SARS-CoV-2 viral copies this  assay can detect is 250 copies / mL. A negative result does not preclude SARS-CoV-2 infection and should not be used as the sole basis for treatment or other patient management decisions.  A  negative result may occur with improper specimen collection / handling, submission of specimen other than nasopharyngeal swab, presence of viral mutation(s) within the areas targeted by this assay, and inadequate number of viral copies (<250 copies / mL). A negative result must be combined with clinical observations, patient history, and epidemiological information.  Fact Sheet for Patients:   RoadLapTop.co.za  Fact Sheet for Healthcare Providers: http://kim-miller.com/  This test is not yet approved or  cleared by the [place] FDA and has been authorized for detection and/or diagnosis of SARS-CoV-2 by FDA under an Emergency Use Authorization (EUA).  This EUA will remain in effect (meaning this test can be used) for the duration of the COVID-19 declaration under Section 564(b)(1) of the Act, 21 U.S.C. section 360bbb-3(b)(1), unless the authorization is terminated or revoked sooner.  Performed at [facility], [address], [place], [place] [address]   MRSA Next Gen by PCR, Nasal     Status: None   Collection Time: 04/08/23  7:00 AM   Specimen: Nasal Mucosa; Nasal Swab  Result Value Ref Range Status   MRSA by PCR Next Gen NOT DETECTED NOT DETECTED Final    Comment: (NOTE) The GeneXpert MRSA Assay (FDA approved for NASAL specimens only), is one component of a comprehensive MRSA colonization surveillance program. It is not intended to diagnose MRSA infection nor to guide or monitor treatment for MRSA infections. Test performance is not FDA approved in patients less than 25 years old. Performed at [facility], [address] [place], [place] [address]   MRSA Next Gen by PCR, Nasal     Status: None   Collection Time: 04/11/23  5:57 PM    Specimen: Nasal Mucosa; Nasal Swab  Result Value Ref Range Status   MRSA by PCR Next Gen NOT DETECTED NOT DETECTED Final    Comment: (NOTE) The GeneXpert MRSA Assay (FDA approved for NASAL specimens only), is one component of a comprehensive MRSA colonization surveillance program. It is not intended to diagnose MRSA infection nor to guide or monitor treatment for MRSA infections. Test performance is not FDA approved in patients less than 47 years old. Performed at [facility], [address] [place], [place] [address]          Radiology Studies: IR Angiogram Visceral Selective  Result Date: 04/11/2023 INDICATION: 89 year old female with extensive visceral ostial atherosclerotic disease and clinical presentation compatible with chronic mesenteric ischemia. EXAM: 1. Ultrasound-guided vascular access of the right common femoral vein. 2. Abdominal aortogram. 3. Selective catheterization and angiography of the superior mesenteric artery. 4. Intravascular ultrasound. 5. Superior mesenteric artery stent placement. MEDICATIONS: 10 mg hydralazine, intravenous ANESTHESIA/SEDATION: Moderate (conscious) sedation was employed during this procedure. A total of Versed 2 mg and Fentanyl 100 mcg was administered intravenously. Moderate Sedation Time: 47 minutes. The patient's level of consciousness and vital signs were monitored continuously by radiology nursing throughout the procedure under my direct supervision. CONTRAST:  80mL OMNIPAQUE IOHEXOL 300 MG/ML  SOLN FLUOROSCOPY: Radiation Exposure Index (as provided by the fluoroscopic device): 796 mGy Kerma COMPLICATIONS: None immediate. PROCEDURE: Informed consent was obtained from the patient following explanation of the procedure, risks, benefits and alternatives. The patient understands, agrees and consents for the procedure. All questions were addressed. A time out was performed prior to the initiation of the procedure. Maximal barrier sterile technique  utilized including caps, mask, sterile gowns, sterile gloves, large sterile drape, hand hygiene, and Betadine prep. Preprocedure ultrasound evaluation of the right common femoral artery demonstrated patency. The procedure  was planned. Subdermal Local anesthesia was administered with 1% lidocaine. A small skin nick was made. Under direct ultrasound visualization, the right common femoral artery is punctured with a 21 gauge micropuncture needle. Permanent ultrasound image was captured and stored in the record. A micropuncture sheath was inserted and limited right lower extremity angiogram was performed which demonstrated adequate puncture site for closure device use. A J wire was then inserted and directed under fluoroscopic guidance to the proximal abdominal aorta. Surgical dilation was performed ultimately allowing placement of a 7 French, 45 cm Tour guide sheath which was positioned in the proximal abdominal aorta. A pigtail catheter was then placed and abdominal aortogram was performed and frontal and lateral projections. Aortogram was significant for extensive circumferential atherosclerotic disease about the aorta and iliac vessels without flow-limiting stenosis. The celiac artery and superior mesenteric artery repeat into with severe ostial stenoses. Single bilateral renal arteries appeared severely stenosed at the ostia secondary to atherosclerotic plaque, however patent distally. The inferior mesenteric artery appeared patent. The superior mesenteric artery was then selected with a 4 French Navicross catheter and Glidewire. Dedicated superior mesenteric angiogram was performed which demonstrated distal patency. AV 18 wire was inserted into the distal main superior mesenteric artery. The catheter was removed and exchanged for an 018 IVUS catheter. Intravascular ultrasound was then performed through the proximal superior mesenteric artery which demonstrated severe, approximately 95% focal stenosis at the ostium  secondary to calcified atherosclerotic plaque. There was mixed fibrofatty and calcific atherosclerotic plaque through approximately the proximal 2 cm of the superior mesenteric artery. Patent vessel sizing was then performed. The V 18 was then exchanged for a Rosen wire. Over the wire, a 7 mm x 29 mm VBX stent was inserted and placed under fluoroscopic guidance. Post deployment balloon molding of the ostial portion was performed up to 9 mm. Completion superior mesenteric angiogram demonstrated wide patency of the indwelling stent and distal vessels. The sheath was removed and exchanged for an 8 [place] Angio-Seal device. This was deployed successfully achieving immediate hemostasis. Peripheral pulses were unchanged. The patient tolerated the procedure well was transferred to the recovery area in good condition. IMPRESSION: 1. 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 2. Technically successful proximal superior mesenteric artery balloon expandable, covered stent placement. Marliss Coots, MD [facility] Electronically Signed   By: Marliss Coots M.D.   On: 04/11/2023 21:57   IR US Guide Vasc Access Right  Result Date: 04/11/2023 INDICATION: 89 year old female with extensive visceral ostial atherosclerotic disease and clinical presentation compatible with chronic mesenteric ischemia. EXAM: 1. Ultrasound-guided vascular access of the right common femoral vein. 2. Abdominal aortogram. 3. Selective catheterization and angiography of the superior mesenteric artery. 4. Intravascular ultrasound. 5. Superior mesenteric artery stent placement. MEDICATIONS: 10 mg hydralazine, intravenous ANESTHESIA/SEDATION: Moderate (conscious) sedation was employed during this procedure. A total of Versed 2 mg and Fentanyl 100 mcg was administered intravenously. Moderate Sedation Time: 47 minutes. The patient's level of consciousness and vital signs were  monitored continuously by radiology nursing throughout the procedure under my direct supervision. CONTRAST:  80mL OMNIPAQUE IOHEXOL 300 MG/ML  SOLN FLUOROSCOPY: Radiation Exposure Index (as provided by the fluoroscopic device): 796 mGy Kerma COMPLICATIONS: None immediate. PROCEDURE: Informed consent was obtained from the patient following explanation of the procedure, risks, benefits and alternatives. The patient understands, agrees and consents for the procedure. All questions were addressed. A time out was performed prior to the initiation of the procedure. Maximal  barrier sterile technique utilized including caps, mask, sterile gowns, sterile gloves, large sterile drape, hand hygiene, and Betadine prep. Preprocedure ultrasound evaluation of the right common femoral artery demonstrated patency. The procedure was planned. Subdermal Local anesthesia was administered with 1% lidocaine. A small skin nick was made. Under direct ultrasound visualization, the right common femoral artery is punctured with a 21 gauge micropuncture needle. Permanent ultrasound image was captured and stored in the record. A micropuncture sheath was inserted and limited right lower extremity angiogram was performed which demonstrated adequate puncture site for closure device use. A J wire was then inserted and directed under fluoroscopic guidance to the proximal abdominal aorta. Surgical dilation was performed ultimately allowing placement of a 7 French, 45 cm Tour guide sheath which was positioned in the proximal abdominal aorta. A pigtail catheter was then placed and abdominal aortogram was performed and frontal and lateral projections. Aortogram was significant for extensive circumferential atherosclerotic disease about the aorta and iliac vessels without flow-limiting stenosis. The celiac artery and superior mesenteric artery repeat into with severe ostial stenoses. Single bilateral renal arteries appeared severely stenosed at the ostia  secondary to atherosclerotic plaque, however patent distally. The inferior mesenteric artery appeared patent. The superior mesenteric artery was then selected with a 4 French Navicross catheter and Glidewire. Dedicated superior mesenteric angiogram was performed which demonstrated distal patency. AV 18 wire was inserted into the distal main superior mesenteric artery. The catheter was removed and exchanged for an 018 IVUS catheter. Intravascular ultrasound was then performed through the proximal superior mesenteric artery which demonstrated severe, approximately 95% focal stenosis at the ostium secondary to calcified atherosclerotic plaque. There was mixed fibrofatty and calcific atherosclerotic plaque through approximately the proximal 2 cm of the superior mesenteric artery. Patent vessel sizing was then performed. The V 18 was then exchanged for a Rosen wire. Over the wire, a 7 mm x 29 mm VBX stent was inserted and placed under fluoroscopic guidance. Post deployment balloon molding of the ostial portion was performed up to 9 mm. Completion superior mesenteric angiogram demonstrated wide patency of the indwelling stent and distal vessels. The sheath was removed and exchanged for an 8 [place] Angio-Seal device. This was deployed successfully achieving immediate hemostasis. Peripheral pulses were unchanged. The patient tolerated the procedure well was transferred to the recovery area in good condition. IMPRESSION: 1. 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 2. Technically successful proximal superior mesenteric artery balloon expandable, covered stent placement. Marliss Coots, MD [facility] Electronically Signed   By: Marliss Coots M.D.   On: 04/11/2023 21:57   IR TRANSCATH PLC STENT 1ST ART NOT LE CV CAR VERT CAR  Result Date: 04/11/2023 INDICATION: 89 year old female with extensive visceral ostial atherosclerotic disease  and clinical presentation compatible with chronic mesenteric ischemia. EXAM: 1. Ultrasound-guided vascular access of the right common femoral vein. 2. Abdominal aortogram. 3. Selective catheterization and angiography of the superior mesenteric artery. 4. Intravascular ultrasound. 5. Superior mesenteric artery stent placement. MEDICATIONS: 10 mg hydralazine, intravenous ANESTHESIA/SEDATION: Moderate (conscious) sedation was employed during this procedure. A total of Versed 2 mg and Fentanyl 100 mcg was administered intravenously. Moderate Sedation Time: 47 minutes. The patient's level of consciousness and vital signs were monitored continuously by radiology nursing throughout the procedure under my direct supervision. CONTRAST:  80mL OMNIPAQUE IOHEXOL 300 MG/ML  SOLN FLUOROSCOPY: Radiation Exposure Index (as provided by the fluoroscopic device): 796 mGy Kerma COMPLICATIONS: None immediate. PROCEDURE: Informed consent was obtained  from the patient following explanation of the procedure, risks, benefits and alternatives. The patient understands, agrees and consents for the procedure. All questions were addressed. A time out was performed prior to the initiation of the procedure. Maximal barrier sterile technique utilized including caps, mask, sterile gowns, sterile gloves, large sterile drape, hand hygiene, and Betadine prep. Preprocedure ultrasound evaluation of the right common femoral artery demonstrated patency. The procedure was planned. Subdermal Local anesthesia was administered with 1% lidocaine. A small skin nick was made. Under direct ultrasound visualization, the right common femoral artery is punctured with a 21 gauge micropuncture needle. Permanent ultrasound image was captured and stored in the record. A micropuncture sheath was inserted and limited right lower extremity angiogram was performed which demonstrated adequate puncture site for closure device use. A J wire was then inserted and directed under  fluoroscopic guidance to the proximal abdominal aorta. Surgical dilation was performed ultimately allowing placement of a 7 French, 45 cm Tour guide sheath which was positioned in the proximal abdominal aorta. A pigtail catheter was then placed and abdominal aortogram was performed and frontal and lateral projections. Aortogram was significant for extensive circumferential atherosclerotic disease about the aorta and iliac vessels without flow-limiting stenosis. The celiac artery and superior mesenteric artery repeat into with severe ostial stenoses. Single bilateral renal arteries appeared severely stenosed at the ostia secondary to atherosclerotic plaque, however patent distally. The inferior mesenteric artery appeared patent. The superior mesenteric artery was then selected with a 4 French Navicross catheter and Glidewire. Dedicated superior mesenteric angiogram was performed which demonstrated distal patency. AV 18 wire was inserted into the distal main superior mesenteric artery. The catheter was removed and exchanged for an 018 IVUS catheter. Intravascular ultrasound was then performed through the proximal superior mesenteric artery which demonstrated severe, approximately 95% focal stenosis at the ostium secondary to calcified atherosclerotic plaque. There was mixed fibrofatty and calcific atherosclerotic plaque through approximately the proximal 2 cm of the superior mesenteric artery. Patent vessel sizing was then performed. The V 18 was then exchanged for a Rosen wire. Over the wire, a 7 mm x 29 mm VBX stent was inserted and placed under fluoroscopic guidance. Post deployment balloon molding of the ostial portion was performed up to 9 mm. Completion superior mesenteric angiogram demonstrated wide patency of the indwelling stent and distal vessels. The sheath was removed and exchanged for an 8 [place] Angio-Seal device. This was deployed successfully achieving immediate hemostasis. Peripheral pulses were  unchanged. The patient tolerated the procedure well was transferred to the recovery area in good condition. IMPRESSION: 1. 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 2. Technically successful proximal superior mesenteric artery balloon expandable, covered stent placement. Marliss Coots, MD [facility] Radiology Electronically Signed   By: Marliss Coots M.D.   On: 04/11/2023 21:57   IR INTRAVASCULAR ULTRASOUND NON CORONARY  Result Date: 04/11/2023 INDICATION: 89 year old female with extensive visceral ostial atherosclerotic disease and clinical presentation compatible with chronic mesenteric ischemia. EXAM: 1. Ultrasound-guided vascular access of the right common femoral vein. 2. Abdominal aortogram. 3. Selective catheterization and angiography of the superior mesenteric artery. 4. Intravascular ultrasound. 5. Superior mesenteric artery stent placement. MEDICATIONS: 10 mg hydralazine, intravenous ANESTHESIA/SEDATION: Moderate (conscious) sedation was employed during this procedure. A total of Versed 2 mg and Fentanyl 100 mcg was administered intravenously. Moderate Sedation Time: 47 minutes. The patient's level of consciousness and vital signs were monitored continuously by radiology nursing throughout the procedure under my direct  supervision. CONTRAST:  80mL OMNIPAQUE IOHEXOL 300 MG/ML  SOLN FLUOROSCOPY: Radiation Exposure Index (as provided by the fluoroscopic device): 796 mGy Kerma COMPLICATIONS: None immediate. PROCEDURE: Informed consent was obtained from the patient following explanation of the procedure, risks, benefits and alternatives. The patient understands, agrees and consents for the procedure. All questions were addressed. A time out was performed prior to the initiation of the procedure. Maximal barrier sterile technique utilized including caps, mask, sterile gowns, sterile gloves, large sterile drape, hand hygiene, and  Betadine prep. Preprocedure ultrasound evaluation of the right common femoral artery demonstrated patency. The procedure was planned. Subdermal Local anesthesia was administered with 1% lidocaine. A small skin nick was made. Under direct ultrasound visualization, the right common femoral artery is punctured with a 21 gauge micropuncture needle. Permanent ultrasound image was captured and stored in the record. A micropuncture sheath was inserted and limited right lower extremity angiogram was performed which demonstrated adequate puncture site for closure device use. A J wire was then inserted and directed under fluoroscopic guidance to the proximal abdominal aorta. Surgical dilation was performed ultimately allowing placement of a 7 French, 45 cm Tour guide sheath which was positioned in the proximal abdominal aorta. A pigtail catheter was then placed and abdominal aortogram was performed and frontal and lateral projections. Aortogram was significant for extensive circumferential atherosclerotic disease about the aorta and iliac vessels without flow-limiting stenosis. The celiac artery and superior mesenteric artery repeat into with severe ostial stenoses. Single bilateral renal arteries appeared severely stenosed at the ostia secondary to atherosclerotic plaque, however patent distally. The inferior mesenteric artery appeared patent. The superior mesenteric artery was then selected with a 4 French Navicross catheter and Glidewire. Dedicated superior mesenteric angiogram was performed which demonstrated distal patency. AV 18 wire was inserted into the distal main superior mesenteric artery. The catheter was removed and exchanged for an 018 IVUS catheter. Intravascular ultrasound was then performed through the proximal superior mesenteric artery which demonstrated severe, approximately 95% focal stenosis at the ostium secondary to calcified atherosclerotic plaque. There was mixed fibrofatty and calcific  atherosclerotic plaque through approximately the proximal 2 cm of the superior mesenteric artery. Patent vessel sizing was then performed. The V 18 was then exchanged for a Rosen wire. Over the wire, a 7 mm x 29 mm VBX stent was inserted and placed under fluoroscopic guidance. Post deployment balloon molding of the ostial portion was performed up to 9 mm. Completion superior mesenteric angiogram demonstrated wide patency of the indwelling stent and distal vessels. The sheath was removed and exchanged for an 8 [place] Angio-Seal device. This was deployed successfully achieving immediate hemostasis. Peripheral pulses were unchanged. The patient tolerated the procedure well was transferred to the recovery area in good condition. IMPRESSION: 1. 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 2. Technically successful proximal superior mesenteric artery balloon expandable, covered stent placement. Marliss Coots, MD [facility] Radiology Electronically Signed   By: Marliss Coots M.D.   On: 04/11/2023 21:57   [place] MESENTERIC ARTERIES  Result Date: 04/11/2023 CLINICAL DATA:  Intestinal angina, weight loss and aortic and visceral atherosclerosis by CT angiography. EXAM: [place] MESENTERIC ARTERIAL DOPPLER COMPARISON:  CTA of the abdomen and pelvis on 04/10/2023 FINDINGS: Celiac axis: 318 proximal cm/sec, 237 centimeters/second distal. Spectral broadening of celiac waveforms. Celiac axis with inspiration: 252 cm/sec Celiac axis with expiration: 176 cm/sec Splenic artery: 61 cm/sec Hepatic artery: 113 cm/sec SMA: 306 proximal, 183 mid, 128 distal cm/sec. Spectral  broadening of SMA waveforms. IMA: 247 cm/sec Aorta: 181 cm/sec Aortic size: 1.9 cm proximally, 1.4 cm in the mid segment and 1.3 cm distally IMPRESSION: Findings suggestive of probable hemodynamically significant stenoses of greater than 70% involving proximal origins of the celiac axis and superior  mesenteric artery. Electronically Signed   By: Irish Lack M.D.   On: 04/11/2023 13:36   CT Angio Abd/Pel w/ and/or w/o  Result Date: 04/11/2023 CLINICAL DATA:  Mesenteric ischemia, acute. Follow-up stenosis on previous examination. History of abdominal pain and nausea. EXAM: CTA ABDOMEN AND PELVIS WITHOUT AND WITH CONTRAST TECHNIQUE: Multidetector CT imaging of the abdomen and pelvis was performed using the standard protocol during bolus administration of intravenous contrast. Multiplanar reconstructed images and MIPs were obtained and reviewed to evaluate the vascular anatomy. RADIATION DOSE REDUCTION: This exam was performed according to the departmental dose-optimization program which includes automated exposure control, adjustment of the mA and/or kV according to patient size and/or use of iterative reconstruction technique. CONTRAST:  75mL OMNIPAQUE IOHEXOL 350 MG/ML SOLN COMPARISON:  03/07/2023 FINDINGS: VASCULAR Aorta: Atherosclerotic disease in the abdominal aorta without aneurysm, dissection or significant aortic stenosis. Celiac: Approximately 50% stenosis at the origin of the celiac artery trunk is similar to the previous examination. Main branch vessels are patent. SMA: Atherosclerotic disease involving the origin and proximal SMA without significant stenosis. Renals: Single right renal artery is patent with at least mild stenosis at the origin. Three left renal arteries. Moderate stenosis involving the origin of the superior left renal artery. No significant stenosis involving the middle left renal artery. There may be mild stenosis involving the inferior left renal artery. IMA: Patent with at least mild stenosis at the origin. Inflow: Common, internal and external iliac arteries are patent without significant stenosis. No evidence for aneurysm or dissection involving the iliac arteries. Proximal Outflow: Proximal femoral arteries are patent bilaterally. Veins: Portal venous system is patent.  IVC and renal veins are patent. Iliac veins and proximal femoral veins are patent. Review of the MIP images confirms the above findings. NON-VASCULAR Lower chest: Bronchiectasis and atelectasis at the lung bases. Hepatobiliary: Stable 8 mm hypodensity in the right hepatic lobe on image 19/15 is likely an incidental finding. No suspicious hepatic lesions. No biliary dilatation. Gallbladder is distended with multiple stones. No inflammatory changes around the gallbladder. Pancreas: Unremarkable. No pancreatic ductal dilatation or surrounding inflammatory changes. Spleen: Normal in size without focal abnormality. Adrenals/Urinary Tract: Adrenal glands are within normal limits. Small hypodensities in both kidneys that likely represent small cysts and do not require dedicated follow-up. Urinary bladder is distended. Stomach/Bowel: Normal appearance of the stomach. Normal appearance of the small bowel without dilatation. Normal appearance of the colon. No evidence for bowel wall thickening or inflammation. No evidence for an obstructive process. Lymphatic: No lymph node enlargement in the abdomen or pelvis. Reproductive: Status post hysterectomy. No adnexal masses. Other: Negative for free fluid. Surgical mesh material along the anterior abdominal wall and anterior aspect of the pelvis. Musculoskeletal: No acute bone abnormality. IMPRESSION: VASCULAR 1. Main mesenteric arteries are patent. Approximately 50% stenosis at the origin of the celiac artery trunk. No significant stenosis involving the superior mesenteric artery. At least mild stenosis involving the origin of the inferior mesenteric artery. 2. Bilateral renal artery stenosis. Multiple left renal arteries as described. 3.  Aortic Atherosclerosis (ICD10-I70.0). NON-VASCULAR 1. No acute abnormality in the abdomen or pelvis. 2. Gallbladder is mildly distended with gallstones. No evidence for acute inflammation. 3. Distended urinary bladder without  hydronephrosis. 4.  Postoperative changes along the anterior abdominal wall with surgical mesh material. 5. Normal appearance of the bowel without evidence for obstruction or inflammatory changes. Electronically Signed   By: Richarda Overlie M.D.   On: 04/11/2023 07:31        Scheduled Meds:  ALPRAZolam  0.25 mg Oral TID   amLODipine  5 mg Oral Daily   aspirin  324 mg Oral Daily   Chlorhexidine Gluconate Cloth  6 each Topical Daily   cycloSPORINE  1 drop Both Eyes BID   enoxaparin (LOVENOX) injection  40 mg Subcutaneous Q24H   guaiFENesin-dextromethorphan  10 mL Oral Q8H   ipratropium  0.5 mg Nebulization Q6H WA   levalbuterol  1.25 mg Nebulization Q6H WA   pantoprazole  40 mg Oral BID AC   Povidone (PF)  1 drop Both Eyes TID   sertraline  50 mg Oral Daily   Continuous Infusions:  azithromycin Stopped (04/11/23 1815)   cefTRIAXone (ROCEPHIN)  IV Stopped (04/12/23 0512)     LOS: 5 days    Time spent: 55 minutes    Kendell Bane, MD [facility]  If 7PM-7AM, please contact night-coverage [email or web address] 04/12/2023, 12:17 PM

## 2023-04-12 NOTE — Progress Notes (Signed)
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  Stent placed thanks to Dr. Domingo Dimes.  Patient today denies abdominal pain; states she is just "worn out" from everything going on over the past 24 hours.  On a clear liquid diet.  Vital signs in last 24 hours: Temp:  [97.7 F (36.5 C)-98.4 F (36.9 C)] 97.7 F (36.5 C) (10/12 0800) Pulse Rate:  [33-88] 78 (10/12 0800) Resp:  [12-26] 16 (10/12 0800) BP: (120-200)/(31-76) 178/44 (10/12 0700) SpO2:  [92 %-100 %] 97 % (10/12 0800) Weight:  [74.1 kg] 74.1 kg (10/11 1800) Last BM Date : 04/11/23 General:   Alert, somewhat frail.  Elderly lady resting comfortably in no acute distress.   Accompanied by nursing staff and son at the bedside Abdomen: Nondistended.  Soft and nontender     Intake/Output from previous day: 10/11 0701 - 10/12 0700 In: 807.6 [IV Piggyback:807.6] Out: 1100 [Urine:1100] Intake/Output this shift: No intake/output data recorded.  Lab Results: Recent Labs    04/11/23 0432 04/12/23 0535  WBC 8.6 6.1  HGB 10.5* 10.7*  HCT 32.6* 34.0*  PLT 277 237   Impression: 89 year old lady admitted to the hospital with progressing postprandial abdominal pain anorexia and weight loss and probable pneumonia.  EGD negative for any significant findings to account for GI symptoms.  CTA demonstrated high-grade SMA stenosis.  Angiogram yesterday confirmed high-grade SMA stenosis for which a stent was placed.  She appears to have tolerated the procedure very well.  On a clear liquids at this time.  Recommendations:  Discussed slowly advancing diet with the patient.  She is quite fearful of advancing her diet due to anticipating postprandial abdominal pain (sitophobia).   I have recommended a low residue heart healthy diet.  I have encouraged patient to start very slowly, advancing intake as tolerated.   Once daily PPI should suffice.

## 2023-04-12 NOTE — Plan of Care (Signed)
Problem: Education: Goal: Knowledge of General Education information will improve Description Including pain rating scale, medication(s)/side effects and non-pharmacologic comfort measures Outcome: Progressing

## 2023-04-13 DIAGNOSIS — K551 Chronic vascular disorders of intestine: Secondary | ICD-10-CM | POA: Diagnosis not present

## 2023-04-13 LAB — BASIC METABOLIC PANEL
Anion gap: 10 (ref 5–15)
BUN: 13 mg/dL (ref 8–23)
CO2: 26 mmol/L (ref 22–32)
Calcium: 8.5 mg/dL — ABNORMAL LOW (ref 8.9–10.3)
Chloride: 97 mmol/L — ABNORMAL LOW (ref 98–111)
Creatinine, Ser: 0.65 mg/dL (ref 0.44–1.00)
GFR, Estimated: >60 mL/min (ref >60)
Glucose, Bld: 136 mg/dL — ABNORMAL HIGH (ref 70–99)
Potassium: 3.6 mmol/L (ref 3.5–5.1)
Sodium: 133 mmol/L — ABNORMAL LOW (ref 135–145)

## 2023-04-13 LAB — CBC
HCT: 29.7 % — ABNORMAL LOW (ref 36.0–46.0)
Hemoglobin: 9.7 g/dL — ABNORMAL LOW (ref 12.0–15.0)
MCH: 29.8 pg (ref 26.0–34.0)
MCHC: 32.7 g/dL (ref 30.0–36.0)
MCV: 91.1 fL (ref 80.0–100.0)
Platelets: 285 10*3/uL (ref 150–400)
RBC: 3.26 MIL/uL — ABNORMAL LOW (ref 3.87–5.11)
RDW: 14 % (ref 11.5–15.5)
WBC: 6.9 10*3/uL (ref 4.0–10.5)
nRBC: 0 % (ref 0.0–0.2)

## 2023-04-13 LAB — GLUCOSE, CAPILLARY
Glucose-Capillary: 161 mg/dL — ABNORMAL HIGH (ref 70–99)
Glucose-Capillary: 162 mg/dL — ABNORMAL HIGH (ref 70–99)
Glucose-Capillary: 166 mg/dL — ABNORMAL HIGH (ref 70–99)

## 2023-04-13 MED ORDER — SENNOSIDES-DOCUSATE SODIUM 8.6-50 MG PO TABS
1.0000 | ORAL_TABLET | ORAL | Status: DC | PRN
  Filled 2023-04-13: qty 1

## 2023-04-13 MED ORDER — MEGESTROL ACETATE 400 MG/10ML PO SUSP
400.0000 mg | Freq: Every day | ORAL | Status: DC
  Administered 2023-04-14 – 2023-04-15 (×2): 400 mg via ORAL
  Filled 2023-04-13 (×2): qty 10

## 2023-04-13 MED ORDER — OXYCODONE HCL 5 MG PO TABS
5.0000 mg | ORAL_TABLET | Freq: Once | ORAL | Status: AC
  Administered 2023-04-13: 5 mg via ORAL
  Filled 2023-04-13: qty 1

## 2023-04-13 MED ORDER — LEVOFLOXACIN 500 MG PO TABS
500.0000 mg | ORAL_TABLET | Freq: Every day | ORAL | Status: DC
  Administered 2023-04-13 – 2023-04-14 (×2): 500 mg via ORAL
  Filled 2023-04-13 (×2): qty 1

## 2023-04-13 NOTE — Evaluation (Signed)
Physical Therapy Evaluation Patient Details Name: [name] MRN: [id number] DOB: 1931-10-19 Today's Date: 04/13/2023  History of Present Illness  Per WU:[id number] C Montez Morita is a 89 y.o. female with medical history significant of alcohol dependence, CKD, COPD, hypertension, hyperlipidemia, DVT, peripheral artery disease, tobacco use disorder, and more presents the ED with a chief complaint of altered mental status.  Patient is not able to provide much history.  She is oriented to herself and place, but not to time.  She reports that she is not sure why she came in.  She thinks that she may have had a fall.  She cannot give details of it.  Per chart review daughter was concerned because patient has been more fatigued and staying in bed all day.  She is ambulating less and interacting less.  Her decrease in ambulation seems to be related to a fear that she might fall.  Daughter reported that she had not been eating.  There was also concern about malodorous urine.  At the time of my exam patient denies any dysuria, but her history is not exactly reliable.  She denies being in pain at the time of my exam.  She does not remember feeling confused.  She does have some tenderness in her left lower quadrant and reports that she has felt constipated.  No further history could be gleaned at this time.  Clinical Impression   Pt is a 89yo female presenting to [facility] due to reason stated in the HPI.  Pt tolerated today's Physical Therapy Evaluation, showing decreased functional mobility, ambulation, and balance which is below pt's baseline. Pt typically ambulating and transferring independently. These deficits due to generalized muscle weakness and deconditioning in part to medical problems limited mobility and energy . Based upon these deficits/impairments, pt would benefit from skilled acute physical therapy services to address the above deficits and improve their functional status. PT  recommends pt discharge to short term skilled rehab facility in order to improve pt's functional status, safety and independence with functional mobility and overall QOL.             If plan is discharge home, recommend the following: A little help with walking and/or transfers   Can travel by private vehicle   No    Equipment Recommendations None recommended by PT  Recommendations for Other Services       Functional Status Assessment Patient has had a recent decline in their functional status and demonstrates the ability to make significant improvements in function in a reasonable and predictable amount of time.     Precautions / Restrictions Precautions Precautions: None Restrictions Weight Bearing Restrictions: No      Mobility  Bed Mobility Overal bed mobility: Needs Assistance Bed Mobility: Supine to Sit     Supine to sit: Min assist     General bed mobility comments: HOB elevated, CGA for 90% of mobility, last 10% requiring minimal assist into sitting Patient Response: Cooperative  Transfers Overall transfer level: Needs assistance   Transfers: Sit to/from Stand Sit to Stand: Contact guard assist           General transfer comment: CGA during sit/stand 3x from EOB once and from recliner twice with CGA for balance. Cues provided for use of arm rails when sit/stand at recliner.    Ambulation/Gait Ambulation/Gait assistance: Contact guard assist Gait Distance (Feet): 5 Feet Assistive device: Rolling walker (2 wheels) Gait Pattern/deviations: WFL(Within Functional Limits), Decreased step length - right, Decreased  step length - left, Decreased stride length, Knee flexed in stance - right, Knee flexed in stance - left       General Gait Details: Increased anterior trunk lean on RW with shuffling gait pattern. CGA for balancing and steady with cues for RW management.  Stairs            Wheelchair Mobility     Tilt Bed Tilt Bed Patient  Response: Cooperative  Modified Rankin (Stroke Patients Only)       Balance Overall balance assessment: Needs assistance Sitting-balance support: No upper extremity supported Sitting balance-Leahy Scale: Fair Sitting balance - Comments: fair/fair sitting EOB during orthostatics.   Standing balance support: Reliant on assistive device for balance, During functional activity Standing balance-Leahy Scale: Fair Standing balance comment: poor/fair during ambulation and standing balance at recliner                             Pertinent Vitals/Pain Pain Assessment Pain Assessment: No/denies pain    Home Living Family/patient expects to be discharged to:: Assisted living Living Arrangements: Group Home Available Help at Discharge: Available PRN/intermittently Type of Home: Independent living facility (assisted living) Home Access: Level entry         Home Equipment: Agricultural consultant (2 wheels)      Prior Function Prior Level of Function : Independent/Modified Independent             Mobility Comments: walks up to a mild a day with use of RW at ALF ADLs Comments: Supervised by ALF staff     Extremity/Trunk Assessment   Upper Extremity Assessment Upper Extremity Assessment: Defer to OT evaluation    Lower Extremity Assessment Lower Extremity Assessment: RLE deficits/detail;LLE deficits/detail RLE Deficits / Details: 4-/5 knee extension; Ankle DF 3+/5 RLE Sensation: WNL LLE Deficits / Details: 4/5 knee extension; Ankle DF 3/5 LLE Sensation: WNL       Communication   Communication Communication: No apparent difficulties Cueing Techniques: Verbal cues  Cognition Arousal: Alert Behavior During Therapy: WFL for tasks assessed/performed                                            General Comments      Exercises Other Exercises Other Exercises: Standing marches x 10 with RW and CGA for baalnce. cues for pacing.   Assessment/Plan     PT Assessment Patient needs continued PT services  PT Problem List Decreased strength;Decreased range of motion;Decreased activity tolerance;Decreased balance;Decreased mobility       PT Treatment Interventions DME instruction;Gait training;Functional mobility training;Therapeutic activities;Therapeutic exercise;Balance training;Neuromuscular re-education    PT Goals (Current goals can be found in the Care Plan section)  Acute Rehab PT Goals Patient Stated Goal: Go [facility] PT Goal Formulation: With patient Time For Goal Achievement: 04/27/23 Potential to Achieve Goals: Good    Frequency Min 3X/week     Co-evaluation               AM-PAC PT "6 Clicks" Mobility  Outcome Measure Help needed turning from your back to your side while in a flat bed without using bedrails?: A Little Help needed moving from lying on your back to sitting on the side of a flat bed without using bedrails?: A Little Help needed moving to and from a bed to a chair (including a wheelchair)?:  A Little Help needed standing up from a chair using your arms (e.g., wheelchair or bedside chair)?: A Little Help needed to walk in hospital room?: A Little Help needed climbing 3-5 steps with a railing? : A Lot 6 Click Score: 17    End of Session Equipment Utilized During Treatment: Gait belt Activity Tolerance: Patient tolerated treatment well Patient left: in chair;with call bell/phone within reach Nurse Communication: Mobility status PT Visit Diagnosis: Unsteadiness on feet (R26.81);Other abnormalities of gait and mobility (R26.89);Muscle weakness (generalized) (M62.81)    Time: 6433-2951 PT Time Calculation (min) (ACUTE ONLY): 41 min   Charges:   PT Evaluation $PT Eval Moderate Complexity: 1 Mod PT Treatments $Therapeutic Activity: 8-22 mins PT General Charges $$ ACUTE PT VISIT: 1 Visit        Nelida Meuse PT, DPT Physical Therapist with [facility] Outpatient  Rehabilitation [phone] office  Nelida Meuse 04/13/2023, 11:49 AM

## 2023-04-13 NOTE — Plan of Care (Signed)

## 2023-04-13 NOTE — Plan of Care (Signed)
  Problem: Acute Rehab PT Goals(only PT should resolve) Goal: Pt Will Go Sit To Supine/Side Flowsheets (Taken 04/13/2023 1151) Pt will go Sit to Supine/Side: Independently Goal: Patient Will Transfer Sit To/From Stand Flowsheets (Taken 04/13/2023 1151) Patient will transfer sit to/from stand: with modified independence Goal: Pt Will Ambulate Flowsheets (Taken 04/13/2023 1151) Pt will Ambulate:  with modified independence  50 feet  with rolling walker  Nelida Meuse PT, DPT Physical Therapist with [facility] Outpatient Rehabilitation [phone] office

## 2023-04-13 NOTE — [place]2 (Signed)
[place] MEDICAID FL2 LEVEL OF CARE FORM     IDENTIFICATION  Patient Name: [name] Birthdate: April 12, 1932 Sex: female Admission Date (Current Location): 04/07/2023  [facility] [place] Number:  [employer] and Address:  [facility],  [address], [place] [address]      Provider Number: [id number]  Attending Physician Name and Address:  Kendell Bane, MD  Relative Name and Phone Number:  [name], [name] [phone])  [phone] (Mobile    Current Level of Care: Hospital Recommended Level of Care: Skilled Nursing Facility Prior Approval Number:    Date Approved/Denied:   PASRR Number: [id number]  Discharge Plan: SNF    Current Diagnoses: Patient Active Problem List   Diagnosis Date Noted   Gastritis and gastroduodenitis 04/10/2023   Abdominal pain, chronic, epigastric 04/08/2023   Mesenteric artery stenosis (HCC) 04/07/2023   Type 2 diabetes mellitus (HCC) 03/10/2023   Acute lower UTI (urinary tract infection) 03/10/2023   Dehydration 12/01/2022   Generalized weakness 11/30/2022   Acute diarrhea 11/30/2022   Hypomagnesemia 11/30/2022   Hypertensive urgency 11/30/2022   Upper respiratory tract infection 11/30/2022   Diabetic neuropathy (HCC) 11/30/2022   S/P total knee arthroplasty, right 04/12/2022   Hypertension associated with type 2 diabetes mellitus (HCC) 02/25/2022   Aortic atherosclerosis (HCC) 02/25/2022   Chronic non-seasonal allergic rhinitis 02/25/2022   Diabetic peripheral neuropathy associated with type 2 diabetes mellitus (HCC) 02/25/2022   Chronic anxiety 02/25/2022   Pneumonia 02/17/2022   GERD (gastroesophageal reflux disease) 02/17/2022   Myalgia due to statin 02/15/2020   Cognitive dysfunction 03/04/2019   Generalized anxiety disorder 06/05/2018   Floaters in visual field, bilateral 03/23/2018   Living in assisted living 03/23/2018   Hyponatremia 03/08/2018   C. difficile diarrhea 03/03/2018   Osteoarthritis  of right knee 12/02/2017   Closed fracture of left orbital floor with routine healing 01/17/2017   Hyperlipidemia associated with type 2 diabetes mellitus (HCC) 10/02/2016   Pedal edema 10/02/2016   Insomnia 10/02/2016   SBO (small bowel obstruction) (HCC) 07/02/2016   Dyspnea on exertion 02/24/2016   Other fatigue    Leukocytosis 12/16/2014   Essential hypertension 12/15/2014   Type 2 diabetes mellitus with atherosclerosis of aorta (HCC) 02/15/2011    Orientation RESPIRATION BLADDER Height & Weight     Self, Time, Situation, Place  Normal Continent Weight: 163 lb 5.8 oz (74.1 kg) Height:  5\' 8"  (172.7 cm)  BEHAVIORAL SYMPTOMS/MOOD NEUROLOGICAL BOWEL NUTRITION STATUS      Continent Diet  AMBULATORY STATUS COMMUNICATION OF NEEDS Skin   Extensive Assist Verbally Normal                       Personal Care Assistance Level of Assistance  Bathing, Feeding, Dressing Bathing Assistance: Limited assistance Feeding assistance: Independent Dressing Assistance: Limited assistance     Functional Limitations Info  Hearing, Sight, Speech Sight Info: Impaired Hearing Info: Adequate Speech Info: Adequate    SPECIAL CARE FACTORS FREQUENCY  PT (By licensed PT), OT (By licensed OT)     PT Frequency: 5 X a week OT Frequency: 3 X a week            Contractures Contractures Info: Not present    Additional Factors Info  Code Status Code Status Info: DNR-limited Allergies Info: Hydrocortisone, Lisinopril, Phenergan (Promethazine Hcl), Statins, Trazodone And Nefazodone, Prednisone           Current Medications (04/13/2023):  This is the current hospital  active medication list Current Facility-Administered Medications  Medication Dose Route Frequency Provider Last Rate Last Admin   ALPRAZolam Prudy Feeler) tablet 0.25 mg  0.25 mg Oral TID Nevin Bloodgood A, MD   0.25 mg at 04/13/23 0856   amLODipine (NORVASC) tablet 10 mg  10 mg Oral Daily Shahmehdi, Seyed A, MD   10 mg at  04/13/23 0857   aspirin chewable tablet 324 mg  324 mg Oral Daily Shahmehdi, Seyed A, MD   324 mg at 04/13/23 0857   Chlorhexidine Gluconate Cloth 2 % PADS 6 each  6 each Topical Daily Shahmehdi, Guadalupe Maple A, MD   6 each at 04/13/23 0858   cycloSPORINE (RESTASIS) 0.05 % ophthalmic emulsion 1 drop  1 drop Both Eyes BID Zierle-Ghosh, Asia B, DO   1 drop at 04/13/23 0858   enoxaparin (LOVENOX) injection 40 mg  40 mg Subcutaneous Q24H Shahmehdi, Seyed A, MD   40 mg at 04/12/23 2133   guaiFENesin-dextromethorphan (ROBITUSSIN DM) 100-10 MG/5ML syrup 10 mL  10 mL Oral Q8H Shahmehdi, Seyed A, MD   10 mL at 04/13/23 0610   hydrALAZINE (APRESOLINE) injection 10 mg  10 mg Intravenous Q4H PRN Shahmehdi, Seyed A, MD   10 mg at 04/13/23 0616   ipratropium (ATROVENT) nebulizer solution 0.5 mg  0.5 mg Nebulization Q6H WA Shahmehdi, Seyed A, MD   0.5 mg at 04/13/23 0852   levalbuterol (XOPENEX) nebulizer solution 1.25 mg  1.25 mg Nebulization Q6H WA Shahmehdi, Seyed A, MD   1.25 mg at 04/13/23 4098   levofloxacin (LEVAQUIN) tablet 500 mg  500 mg Oral Daily Shahmehdi, Seyed A, MD       megestrol (MEGACE) 400 MG/10ML suspension 400 mg  400 mg Oral Daily Shahmehdi, Seyed A, MD       melatonin tablet 6 mg  6 mg Oral QHS PRN Nevin Bloodgood A, MD   6 mg at 04/12/23 2128   Muscle Rub CREA   Topical PRN Kendell Bane, MD   Given at 04/11/23 0647   ondansetron (ZOFRAN) tablet 4 mg  4 mg Oral Q6H PRN Shahmehdi, Seyed A, MD       Or   ondansetron (ZOFRAN) injection 4 mg  4 mg Intravenous Q6H PRN Shahmehdi, Seyed A, MD   4 mg at 04/10/23 0810   pantoprazole (PROTONIX) EC tablet 40 mg  40 mg Oral BID AC Shahmehdi, Seyed A, MD   40 mg at 04/13/23 0856   polyvinyl alcohol (LIQUIFILM TEARS) 1.4 % ophthalmic solution 1 drop  1 drop Both Eyes TID PRN Shahmehdi, Seyed A, MD       sertraline (ZOLOFT) tablet 50 mg  50 mg Oral Daily Shahmehdi, Seyed A, MD   50 mg at 04/13/23 1191     Discharge Medications: Please see discharge  summary for a list of discharge medications.  Relevant Imaging Results:  Relevant Lab Results:   Additional Information [id number]  Catalina Gravel, LCSW

## 2023-04-13 NOTE — Progress Notes (Signed)
PROGRESS NOTE    [name]  ZOX:[id number] DOB: 10-08-1931 DOA: 04/07/2023 PCP: Babs Sciara, MD   Brief Narrative:    [name] is a vibrant 89 y.o. 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 knee replacement in 2023 who was walking a mile a day at the end of last year.  Earlier this year she was down to half a mile a day.  In the last 3 months the patient has been plagued with intermittent diarrhea, poor appetite, and progressive weakness.  She appears to have hyponatremia that is improving as well as community-acquired pneumonia seen on chest x-ray.  She is also noted to have increasing weight loss and malaise that may be related to mesenteric ischemia.  GI asked to evaluate for this condition to see if IR intervention could be beneficial.  Palliative also consulted for further goals of care discussion.    Subjective: The patient was seen and examined this morning, stable no acute distress, complaining fatigue and generalized weaknesses. Per patient minimal oral intake poor appetite Denies any abdominal pain nausea or vomiting  04/11/2023 s/p treatment of SMA stenosis with angiography and stenting at 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 Marliss Coots   -Improved abdominal pain, advancing diet slowly today -GI following  -Status post repeat CTA, revealing. Approximately 50% stenosis at the  origin of the celiac artery trunk. No significant stenosis involving the superior mesenteric artery. At least mild stenosis involving the origin of the inferior mesenteric artery. 2. Bilateral renal artery stenosis. Multiple left renal arteries as described.  -Discussed the case with GI and IR Dr. Elby Showers on 04/11/2023  -On PPI added ASA  -POA  postprandial Pain, burning sensation, 8 pound weight loss   Community-acquired pneumonia Improved shortness of breath, coughing, wheezing -On  IV Solu-Medrol -discontinued IV steroids -  breathing treatment DuoNeb bronchodilators -IV azithromycin/Rocephin switched to p.o. Levaquin -Legionella neg  Hyponatremia-resolved Monitoring serum sodium -S/p  IVF     Recurrent diarrhea-, resolved -Monitoring BMs -On fiber supplementation and hold antimotility agents  Concern for failure to thrive -Appears to have poor long-term prognosis and does not appear to want aggressive measures -Appreciate palliative consultation -Adding Megace   Depression  -Very withdrawn, depressed -In scheduling Xanax     DVT prophylaxis:Lovenox Code Status: DNR Family Communication: Discussed with her son at bedside Disposition Plan:  Status is: Inpatient Remains inpatient appropriate because: Need for IV medications and inpt procedure Disposition-likely SNF family requested [facility] consulted  Consultants:  GI Palliative  Procedures:  CTA   Antimicrobials:  Anti-infectives (From admission, onward)    Start     Dose/Rate Route Frequency Ordered Stop   04/13/23 1600  levofloxacin (LEVAQUIN) tablet 500 mg        500 mg Oral Daily 04/13/23 0757     04/08/23 1600  azithromycin (ZITHROMAX) 500 mg in sodium chloride 0.9 % 250 mL IVPB  Status:  Discontinued  500 mg 250 mL/hr over 60 Minutes Intravenous Every 24 hours 04/07/23 2043 04/13/23 0757   04/08/23 0530  cefTRIAXone (ROCEPHIN) 2 g in sodium chloride 0.9 % 100 mL IVPB  Status:   Discontinued        2 g 200 mL/hr over 30 Minutes Intravenous Every 24 hours 04/07/23 2042 04/13/23 0757   04/07/23 1545  cefTRIAXone (ROCEPHIN) 1 g in sodium chloride 0.9 % 100 mL IVPB        1 g 200 mL/hr over 30 Minutes Intravenous  Once 04/07/23 1535 04/07/23 1618   04/07/23 1545  azithromycin (ZITHROMAX) 500 mg in sodium chloride 0.9 % 250 mL IVPB        500 mg 250 mL/hr over 60 Minutes Intravenous  Once 04/07/23 1535 04/07/23 1724        Objective: Vitals:   04/13/23 0400 04/13/23 0500 04/13/23 0600 04/13/23 0634  BP: (!) 161/60  (!) 207/47 (!) 166/51  Pulse: 69 82 71 81  Resp: 20 19 (!) 35 (!) 21  Temp:      TempSrc:      SpO2: 92% 97% 92% 92%  Weight:      Height:        Intake/Output Summary (Last 24 hours) at 04/13/2023 1048 Last data filed at 04/13/2023 0502 Gross per 24 hour  Intake 480 ml  Output 100 ml  Net 380 ml    Filed Weights   04/08/23 0637 04/11/23 1800  Weight: 72.6 kg 74.1 kg          General:  AAO x 3,  cooperative, no distress;   HEENT:  Normocephalic, PERRL, otherwise with in Normal limits   Neuro:  CNII-XII intact. , normal motor and sensation, reflexes intact   Lungs:   Clear to auscultation BL, Respirations unlabored,  No wheezes / crackles  Cardio:    S1/S2, RRR, No murmure, No Rubs or Gallops   Abdomen:  Soft, non-tender, bowel sounds active all four quadrants, no guarding or peritoneal signs.  Muscular  skeletal:  Limited exam -global generalized weaknesses - in bed, able to move all 4 extremities,   2+ pulses,  symmetric, No pitting edema  Skin:  Dry, warm to touch, negative for any Rashes,  Wounds: Please see nursing documentation                  Data Reviewed: I have personally reviewed following labs and imaging studies  CBC: Recent Labs  Lab 04/07/23 1455 04/09/23 0437 04/11/23 0432 04/12/23 0535 04/13/23 0422  WBC 7.2 8.8 8.6 6.1 6.9  NEUTROABS 4.7  --   --   --   --   HGB 11.7* 11.3* 10.5* 10.7*  9.7*  HCT 35.8* 35.4* 32.6* 34.0* 29.7*  MCV 88.8 90.3 91.1 92.1 91.1  PLT 245 275 277 237 285   Basic Metabolic Panel: Recent Labs  Lab 04/08/23 0247 04/09/23 0437 04/11/23 0432 04/12/23 0535 04/13/23 0422  NA 133* 133* 132* 137 133*  K 3.7 3.5 3.7 4.4 3.6  CL 96* 94* 94* 98 97*  CO2 27 29 29 29 26   GLUCOSE 117* 128* 128* 146* 136*  BUN 12 16 15 14 13   CREATININE 0.57 0.80 0.79 0.74 0.65  CALCIUM 8.8* 9.0 8.9 9.0 8.5*  MG  --  1.7  --   --   --    GFR: Estimated Creatinine Clearance: 48.1 mL/min (by C-G formula based on SCr of 0.65 mg/dL). Liver Function Tests: Recent Labs  Lab 04/07/23  1455 04/08/23 0247 04/09/23 0437  AST 17 17 13*  ALT 15 14 14   ALKPHOS 68 61 58  BILITOT 0.6 1.0 0.8  PROT 6.3* 6.0* 6.0*  ALBUMIN 3.5 3.3* 3.1*   CBG: Recent Labs  Lab 04/11/23 2349 04/12/23 0518 04/12/23 1224 04/12/23 1804 04/12/23 2017  GLUCAP 131* 138* 179* 181* 127*    Thyroid Function Tests: No results for input(s): "TSH", "T4TOTAL", "FREET4", "T3FREE", "THYROIDAB" in the last 72 hours.  Anemia Panel:   Recent Results (from the past 240 hour(s))  Microscopic Examination     Status: Abnormal   Collection Time: 04/04/23  8:50 AM  Result Value Ref Range Status   WBC, UA 6-10 (A) 0 - 5 /hpf Final   RBC, Urine 0-2 0 - 2 /hpf Final   Epithelial Cells (non renal) 0-10 0 - 10 /hpf Final   Casts None seen None seen /lpf Final   Bacteria, UA None seen None seen/Few Final  Urine Culture, Reflex     Status: None   Collection Time: 04/04/23  8:50 AM  Result Value Ref Range Status   Urine Culture, Routine Final report  Final   Organism ID, Bacteria Comment  Final    Comment: Mixed urogenital flora Less than 10,000 colonies/mL   SARS Coronavirus 2 by RT PCR (hospital order, performed in [facility] hospital lab) *cepheid single result test* Anterior Nasal Swab     Status: None   Collection Time: 04/06/23  9:00 AM   Specimen: Anterior Nasal Swab  Result Value Ref Range  Status   SARS Coronavirus 2 by RT PCR NEGATIVE NEGATIVE Final    Comment: (NOTE) SARS-CoV-2 target nucleic acids are NOT DETECTED.  The SARS-CoV-2 RNA is generally detectable in upper and lower respiratory specimens during the acute phase of infection. The lowest concentration of SARS-CoV-2 viral copies this assay can detect is 250 copies / mL. A negative result does not preclude SARS-CoV-2 infection and should not be used as the sole basis for treatment or other patient management decisions.  A negative result may occur with improper specimen collection / handling, submission of specimen other than nasopharyngeal swab, presence of viral mutation(s) within the areas targeted by this assay, and inadequate number of viral copies (<250 copies / mL). A negative result must be combined with clinical observations, patient history, and epidemiological information.  Fact Sheet for Patients:   RoadLapTop.co.za  Fact Sheet for Healthcare Providers: http://kim-miller.com/  This test is not yet approved or  cleared by the [place] FDA and has been authorized for detection and/or diagnosis of SARS-CoV-2 by FDA under an Emergency Use Authorization (EUA).  This EUA will remain in effect (meaning this test can be used) for the duration of the COVID-19 declaration under Section 564(b)(1) of the Act, 21 U.S.C. section 360bbb-3(b)(1), unless the authorization is terminated or revoked sooner.  Performed at [facility], [address], [place], [place] [address]   SARS Coronavirus 2 by RT PCR (hospital order, performed in [facility] hospital lab) *cepheid single result test*     Status: None   Collection Time: 04/07/23  9:09 PM  Result Value Ref Range Status   SARS Coronavirus 2 by RT PCR NEGATIVE NEGATIVE Final    Comment: (NOTE) SARS-CoV-2 target nucleic acids are NOT DETECTED.  The SARS-CoV-2 RNA is generally detectable in upper and  lower respiratory specimens during the acute phase of infection. The lowest concentration of SARS-CoV-2 viral copies this assay can detect is 250 copies / mL.  A negative result does not preclude SARS-CoV-2 infection and should not be used as the sole basis for treatment or other patient management decisions.  A negative result may occur with improper specimen collection / handling, submission of specimen other than nasopharyngeal swab, presence of viral mutation(s) within the areas targeted by this assay, and inadequate number of viral copies (<250 copies / mL). A negative result must be combined with clinical observations, patient history, and epidemiological information.  Fact Sheet for Patients:   RoadLapTop.co.za  Fact Sheet for Healthcare Providers: http://kim-miller.com/  This test is not yet approved or  cleared by the [place] FDA and has been authorized for detection and/or diagnosis of SARS-CoV-2 by FDA under an Emergency Use Authorization (EUA).  This EUA will remain in effect (meaning this test can be used) for the duration of the COVID-19 declaration under Section 564(b)(1) of the Act, 21 U.S.C. section 360bbb-3(b)(1), unless the authorization is terminated or revoked sooner.  Performed at [facility], [address], [place], [place] [address]   MRSA Next Gen by PCR, Nasal     Status: None   Collection Time: 04/08/23  7:00 AM   Specimen: Nasal Mucosa; Nasal Swab  Result Value Ref Range Status   MRSA by PCR Next Gen NOT DETECTED NOT DETECTED Final    Comment: (NOTE) The GeneXpert MRSA Assay (FDA approved for NASAL specimens only), is one component of a comprehensive MRSA colonization surveillance program. It is not intended to diagnose MRSA infection nor to guide or monitor treatment for MRSA infections. Test performance is not FDA approved in patients less than 22 years old. Performed at [facility], [address], [place], [place] [address]   MRSA Next Gen by PCR, Nasal     Status: None   Collection Time: 04/11/23  5:57 PM   Specimen: Nasal Mucosa; Nasal Swab  Result Value Ref Range Status   MRSA by PCR Next Gen NOT DETECTED NOT DETECTED Final    Comment: (NOTE) The GeneXpert MRSA Assay (FDA approved for NASAL specimens only), is one component of a comprehensive MRSA colonization surveillance program. It is not intended to diagnose MRSA infection nor to guide or monitor treatment for MRSA infections. Test performance is not FDA approved in patients less than 69 years old. Performed at [facility], [address] [place], [place] [address]          Radiology Studies: IR Angiogram Visceral Selective  Result Date: 04/11/2023 INDICATION: 89 year old female with extensive visceral ostial atherosclerotic disease and clinical presentation compatible with chronic mesenteric ischemia. EXAM: 1. Ultrasound-guided vascular access of the right common femoral vein. 2. Abdominal aortogram. 3. Selective catheterization and angiography of the superior mesenteric artery. 4. Intravascular ultrasound. 5. Superior mesenteric artery stent placement. MEDICATIONS: 10 mg hydralazine, intravenous ANESTHESIA/SEDATION: Moderate (conscious) sedation was employed during this procedure. A total of Versed 2 mg and Fentanyl 100 mcg was administered intravenously. Moderate Sedation Time: 47 minutes. The patient's level of consciousness and vital signs were monitored continuously by radiology nursing throughout the procedure under my direct supervision. CONTRAST:  80mL OMNIPAQUE IOHEXOL 300 MG/ML  SOLN FLUOROSCOPY: Radiation Exposure Index (as provided by the fluoroscopic device): 796 mGy Kerma COMPLICATIONS: None immediate. PROCEDURE: Informed consent was obtained from the patient following explanation of the procedure, risks, benefits and alternatives. The patient understands, agrees and consents for the procedure. All questions  were addressed. A time out was performed prior to the initiation of the procedure. Maximal barrier sterile technique utilized including caps,  mask, sterile gowns, sterile gloves, large sterile drape, hand hygiene, and Betadine prep. Preprocedure ultrasound evaluation of the right common femoral artery demonstrated patency. The procedure was planned. Subdermal Local anesthesia was administered with 1% lidocaine. A small skin nick was made. Under direct ultrasound visualization, the right common femoral artery is punctured with a 21 gauge micropuncture needle. Permanent ultrasound image was captured and stored in the record. A micropuncture sheath was inserted and limited right lower extremity angiogram was performed which demonstrated adequate puncture site for closure device use. A J wire was then inserted and directed under fluoroscopic guidance to the proximal abdominal aorta. Surgical dilation was performed ultimately allowing placement of a 7 French, 45 cm Tour guide sheath which was positioned in the proximal abdominal aorta. A pigtail catheter was then placed and abdominal aortogram was performed and frontal and lateral projections. Aortogram was significant for extensive circumferential atherosclerotic disease about the aorta and iliac vessels without flow-limiting stenosis. The celiac artery and superior mesenteric artery repeat into with severe ostial stenoses. Single bilateral renal arteries appeared severely stenosed at the ostia secondary to atherosclerotic plaque, however patent distally. The inferior mesenteric artery appeared patent. The superior mesenteric artery was then selected with a 4 French Navicross catheter and Glidewire. Dedicated superior mesenteric angiogram was performed which demonstrated distal patency. AV 18 wire was inserted into the distal main superior mesenteric artery. The catheter was removed and exchanged for an 018 IVUS catheter. Intravascular ultrasound was then performed  through the proximal superior mesenteric artery which demonstrated severe, approximately 95% focal stenosis at the ostium secondary to calcified atherosclerotic plaque. There was mixed fibrofatty and calcific atherosclerotic plaque through approximately the proximal 2 cm of the superior mesenteric artery. Patent vessel sizing was then performed. The V 18 was then exchanged for a Rosen wire. Over the wire, a 7 mm x 29 mm VBX stent was inserted and placed under fluoroscopic guidance. Post deployment balloon molding of the ostial portion was performed up to 9 mm. Completion superior mesenteric angiogram demonstrated wide patency of the indwelling stent and distal vessels. The sheath was removed and exchanged for an 8 [place] Angio-Seal device. This was deployed successfully achieving immediate hemostasis. Peripheral pulses were unchanged. The patient tolerated the procedure well was transferred to the recovery area in good condition. IMPRESSION: 1. 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 2. Technically successful proximal superior mesenteric artery balloon expandable, covered stent placement. Marliss Coots, MD [facility] Radiology Electronically Signed   By: Marliss Coots M.D.   On: 04/11/2023 21:57   IR US Guide Vasc Access Right  Result Date: 04/11/2023 INDICATION: 89 year old female with extensive visceral ostial atherosclerotic disease and clinical presentation compatible with chronic mesenteric ischemia. EXAM: 1. Ultrasound-guided vascular access of the right common femoral vein. 2. Abdominal aortogram. 3. Selective catheterization and angiography of the superior mesenteric artery. 4. Intravascular ultrasound. 5. Superior mesenteric artery stent placement. MEDICATIONS: 10 mg hydralazine, intravenous ANESTHESIA/SEDATION: Moderate (conscious) sedation was employed during this procedure. A total of Versed 2 mg and Fentanyl 100 mcg was  administered intravenously. Moderate Sedation Time: 47 minutes. The patient's level of consciousness and vital signs were monitored continuously by radiology nursing throughout the procedure under my direct supervision. CONTRAST:  80mL OMNIPAQUE IOHEXOL 300 MG/ML  SOLN FLUOROSCOPY: Radiation Exposure Index (as provided by the fluoroscopic device): 796 mGy Kerma COMPLICATIONS: None immediate. PROCEDURE: Informed consent was obtained from the patient following explanation of the procedure, risks, benefits and alternatives.  The patient understands, agrees and consents for the procedure. All questions were addressed. A time out was performed prior to the initiation of the procedure. Maximal barrier sterile technique utilized including caps, mask, sterile gowns, sterile gloves, large sterile drape, hand hygiene, and Betadine prep. Preprocedure ultrasound evaluation of the right common femoral artery demonstrated patency. The procedure was planned. Subdermal Local anesthesia was administered with 1% lidocaine. A small skin nick was made. Under direct ultrasound visualization, the right common femoral artery is punctured with a 21 gauge micropuncture needle. Permanent ultrasound image was captured and stored in the record. A micropuncture sheath was inserted and limited right lower extremity angiogram was performed which demonstrated adequate puncture site for closure device use. A J wire was then inserted and directed under fluoroscopic guidance to the proximal abdominal aorta. Surgical dilation was performed ultimately allowing placement of a 7 French, 45 cm Tour guide sheath which was positioned in the proximal abdominal aorta. A pigtail catheter was then placed and abdominal aortogram was performed and frontal and lateral projections. Aortogram was significant for extensive circumferential atherosclerotic disease about the aorta and iliac vessels without flow-limiting stenosis. The celiac artery and superior mesenteric  artery repeat into with severe ostial stenoses. Single bilateral renal arteries appeared severely stenosed at the ostia secondary to atherosclerotic plaque, however patent distally. The inferior mesenteric artery appeared patent. The superior mesenteric artery was then selected with a 4 French Navicross catheter and Glidewire. Dedicated superior mesenteric angiogram was performed which demonstrated distal patency. AV 18 wire was inserted into the distal main superior mesenteric artery. The catheter was removed and exchanged for an 018 IVUS catheter. Intravascular ultrasound was then performed through the proximal superior mesenteric artery which demonstrated severe, approximately 95% focal stenosis at the ostium secondary to calcified atherosclerotic plaque. There was mixed fibrofatty and calcific atherosclerotic plaque through approximately the proximal 2 cm of the superior mesenteric artery. Patent vessel sizing was then performed. The V 18 was then exchanged for a Rosen wire. Over the wire, a 7 mm x 29 mm VBX stent was inserted and placed under fluoroscopic guidance. Post deployment balloon molding of the ostial portion was performed up to 9 mm. Completion superior mesenteric angiogram demonstrated wide patency of the indwelling stent and distal vessels. The sheath was removed and exchanged for an 8 [place] Angio-Seal device. This was deployed successfully achieving immediate hemostasis. Peripheral pulses were unchanged. The patient tolerated the procedure well was transferred to the recovery area in good condition. IMPRESSION: 1. 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 2. Technically successful proximal superior mesenteric artery balloon expandable, covered stent placement. Marliss Coots, MD [facility] Radiology Electronically Signed   By: Marliss Coots M.D.   On: 04/11/2023 21:57   IR TRANSCATH PLC STENT 1ST ART NOT LE CV CAR VERT  CAR  Result Date: 04/11/2023 INDICATION: 89 year old female with extensive visceral ostial atherosclerotic disease and clinical presentation compatible with chronic mesenteric ischemia. EXAM: 1. Ultrasound-guided vascular access of the right common femoral vein. 2. Abdominal aortogram. 3. Selective catheterization and angiography of the superior mesenteric artery. 4. Intravascular ultrasound. 5. Superior mesenteric artery stent placement. MEDICATIONS: 10 mg hydralazine, intravenous ANESTHESIA/SEDATION: Moderate (conscious) sedation was employed during this procedure. A total of Versed 2 mg and Fentanyl 100 mcg was administered intravenously. Moderate Sedation Time: 47 minutes. The patient's level of consciousness and vital signs were monitored continuously by radiology nursing throughout the procedure under my direct supervision. CONTRAST:  80mL OMNIPAQUE  IOHEXOL 300 MG/ML  SOLN FLUOROSCOPY: Radiation Exposure Index (as provided by the fluoroscopic device): 796 mGy Kerma COMPLICATIONS: None immediate. PROCEDURE: Informed consent was obtained from the patient following explanation of the procedure, risks, benefits and alternatives. The patient understands, agrees and consents for the procedure. All questions were addressed. A time out was performed prior to the initiation of the procedure. Maximal barrier sterile technique utilized including caps, mask, sterile gowns, sterile gloves, large sterile drape, hand hygiene, and Betadine prep. Preprocedure ultrasound evaluation of the right common femoral artery demonstrated patency. The procedure was planned. Subdermal Local anesthesia was administered with 1% lidocaine. A small skin nick was made. Under direct ultrasound visualization, the right common femoral artery is punctured with a 21 gauge micropuncture needle. Permanent ultrasound image was captured and stored in the record. A micropuncture sheath was inserted and limited right lower extremity angiogram was  performed which demonstrated adequate puncture site for closure device use. A J wire was then inserted and directed under fluoroscopic guidance to the proximal abdominal aorta. Surgical dilation was performed ultimately allowing placement of a 7 French, 45 cm Tour guide sheath which was positioned in the proximal abdominal aorta. A pigtail catheter was then placed and abdominal aortogram was performed and frontal and lateral projections. Aortogram was significant for extensive circumferential atherosclerotic disease about the aorta and iliac vessels without flow-limiting stenosis. The celiac artery and superior mesenteric artery repeat into with severe ostial stenoses. Single bilateral renal arteries appeared severely stenosed at the ostia secondary to atherosclerotic plaque, however patent distally. The inferior mesenteric artery appeared patent. The superior mesenteric artery was then selected with a 4 French Navicross catheter and Glidewire. Dedicated superior mesenteric angiogram was performed which demonstrated distal patency. AV 18 wire was inserted into the distal main superior mesenteric artery. The catheter was removed and exchanged for an 018 IVUS catheter. Intravascular ultrasound was then performed through the proximal superior mesenteric artery which demonstrated severe, approximately 95% focal stenosis at the ostium secondary to calcified atherosclerotic plaque. There was mixed fibrofatty and calcific atherosclerotic plaque through approximately the proximal 2 cm of the superior mesenteric artery. Patent vessel sizing was then performed. The V 18 was then exchanged for a Rosen wire. Over the wire, a 7 mm x 29 mm VBX stent was inserted and placed under fluoroscopic guidance. Post deployment balloon molding of the ostial portion was performed up to 9 mm. Completion superior mesenteric angiogram demonstrated wide patency of the indwelling stent and distal vessels. The sheath was removed and exchanged for  an 8 [place] Angio-Seal device. This was deployed successfully achieving immediate hemostasis. Peripheral pulses were unchanged. The patient tolerated the procedure well was transferred to the recovery area in good condition. IMPRESSION: 1. 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 2. Technically successful proximal superior mesenteric artery balloon expandable, covered stent placement. Marliss Coots, MD [facility] Electronically Signed   By: Marliss Coots M.D.   On: 04/11/2023 21:57   IR INTRAVASCULAR ULTRASOUND NON CORONARY  Result Date: 04/11/2023 INDICATION: 89 year old female with extensive visceral ostial atherosclerotic disease and clinical presentation compatible with chronic mesenteric ischemia. EXAM: 1. Ultrasound-guided vascular access of the right common femoral vein. 2. Abdominal aortogram. 3. Selective catheterization and angiography of the superior mesenteric artery. 4. Intravascular ultrasound. 5. Superior mesenteric artery stent placement. MEDICATIONS: 10 mg hydralazine, intravenous ANESTHESIA/SEDATION: Moderate (conscious) sedation was employed during this procedure. A total of Versed 2 mg and Fentanyl 100 mcg was administered  intravenously. Moderate Sedation Time: 47 minutes. The patient's level of consciousness and vital signs were monitored continuously by radiology nursing throughout the procedure under my direct supervision. CONTRAST:  80mL OMNIPAQUE IOHEXOL 300 MG/ML  SOLN FLUOROSCOPY: Radiation Exposure Index (as provided by the fluoroscopic device): 796 mGy Kerma COMPLICATIONS: None immediate. PROCEDURE: Informed consent was obtained from the patient following explanation of the procedure, risks, benefits and alternatives. The patient understands, agrees and consents for the procedure. All questions were addressed. A time out was performed prior to the initiation of the procedure. Maximal barrier  sterile technique utilized including caps, mask, sterile gowns, sterile gloves, large sterile drape, hand hygiene, and Betadine prep. Preprocedure ultrasound evaluation of the right common femoral artery demonstrated patency. The procedure was planned. Subdermal Local anesthesia was administered with 1% lidocaine. A small skin nick was made. Under direct ultrasound visualization, the right common femoral artery is punctured with a 21 gauge micropuncture needle. Permanent ultrasound image was captured and stored in the record. A micropuncture sheath was inserted and limited right lower extremity angiogram was performed which demonstrated adequate puncture site for closure device use. A J wire was then inserted and directed under fluoroscopic guidance to the proximal abdominal aorta. Surgical dilation was performed ultimately allowing placement of a 7 French, 45 cm Tour guide sheath which was positioned in the proximal abdominal aorta. A pigtail catheter was then placed and abdominal aortogram was performed and frontal and lateral projections. Aortogram was significant for extensive circumferential atherosclerotic disease about the aorta and iliac vessels without flow-limiting stenosis. The celiac artery and superior mesenteric artery repeat into with severe ostial stenoses. Single bilateral renal arteries appeared severely stenosed at the ostia secondary to atherosclerotic plaque, however patent distally. The inferior mesenteric artery appeared patent. The superior mesenteric artery was then selected with a 4 French Navicross catheter and Glidewire. Dedicated superior mesenteric angiogram was performed which demonstrated distal patency. AV 18 wire was inserted into the distal main superior mesenteric artery. The catheter was removed and exchanged for an 018 IVUS catheter. Intravascular ultrasound was then performed through the proximal superior mesenteric artery which demonstrated severe, approximately 95% focal  stenosis at the ostium secondary to calcified atherosclerotic plaque. There was mixed fibrofatty and calcific atherosclerotic plaque through approximately the proximal 2 cm of the superior mesenteric artery. Patent vessel sizing was then performed. The V 18 was then exchanged for a Rosen wire. Over the wire, a 7 mm x 29 mm VBX stent was inserted and placed under fluoroscopic guidance. Post deployment balloon molding of the ostial portion was performed up to 9 mm. Completion superior mesenteric angiogram demonstrated wide patency of the indwelling stent and distal vessels. The sheath was removed and exchanged for an 8 [place] Angio-Seal device. This was deployed successfully achieving immediate hemostasis. Peripheral pulses were unchanged. The patient tolerated the procedure well was transferred to the recovery area in good condition. IMPRESSION: 1. 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 2. Technically successful proximal superior mesenteric artery balloon expandable, covered stent placement. Marliss Coots, MD [facility] Radiology Electronically Signed   By: Marliss Coots M.D.   On: 04/11/2023 21:57        Scheduled Meds:  ALPRAZolam  0.25 mg Oral TID   amLODipine  10 mg Oral Daily   aspirin  324 mg Oral Daily   Chlorhexidine Gluconate Cloth  6 each Topical Daily   cycloSPORINE  1 drop Both Eyes BID   enoxaparin (LOVENOX) injection  40 mg Subcutaneous Q24H   guaiFENesin-dextromethorphan  10 mL Oral Q8H   ipratropium  0.5 mg Nebulization Q6H WA   levalbuterol  1.25 mg Nebulization Q6H WA   levofloxacin  500 mg Oral Daily   pantoprazole  40 mg Oral BID AC   sertraline  50 mg Oral Daily   Continuous Infusions:     LOS: 6 days    Time spent: 55 minutes    Kendell Bane, MD [facility]  If 7PM-7AM, please contact night-coverage [email or web address] 04/13/2023, 10:48 AM

## 2023-04-14 DIAGNOSIS — R109 Unspecified abdominal pain: Secondary | ICD-10-CM | POA: Diagnosis not present

## 2023-04-14 DIAGNOSIS — K551 Chronic vascular disorders of intestine: Secondary | ICD-10-CM | POA: Diagnosis not present

## 2023-04-14 DIAGNOSIS — Z515 Encounter for palliative care: Secondary | ICD-10-CM | POA: Diagnosis not present

## 2023-04-14 DIAGNOSIS — K222 Esophageal obstruction: Secondary | ICD-10-CM | POA: Diagnosis not present

## 2023-04-14 DIAGNOSIS — R638 Other symptoms and signs concerning food and fluid intake: Secondary | ICD-10-CM | POA: Diagnosis not present

## 2023-04-14 DIAGNOSIS — Z7189 Other specified counseling: Secondary | ICD-10-CM | POA: Diagnosis not present

## 2023-04-14 LAB — BASIC METABOLIC PANEL
Anion gap: 7 (ref 5–15)
BUN: 10 mg/dL (ref 8–23)
CO2: 29 mmol/L (ref 22–32)
Calcium: 8.5 mg/dL — ABNORMAL LOW (ref 8.9–10.3)
Chloride: 95 mmol/L — ABNORMAL LOW (ref 98–111)
Creatinine, Ser: 0.72 mg/dL (ref 0.44–1.00)
GFR, Estimated: >60 mL/min (ref >60)
Glucose, Bld: 165 mg/dL — ABNORMAL HIGH (ref 70–99)
Potassium: 3.6 mmol/L (ref 3.5–5.1)
Sodium: 131 mmol/L — ABNORMAL LOW (ref 135–145)

## 2023-04-14 LAB — GLUCOSE, CAPILLARY
Glucose-Capillary: 176 mg/dL — ABNORMAL HIGH (ref 70–99)
Glucose-Capillary: 149 mg/dL — ABNORMAL HIGH (ref 70–99)
Glucose-Capillary: 152 mg/dL — ABNORMAL HIGH (ref 70–99)
Glucose-Capillary: 151 mg/dL — ABNORMAL HIGH (ref 70–99)

## 2023-04-14 LAB — CBC
HCT: 29.8 % — ABNORMAL LOW (ref 36.0–46.0)
Hemoglobin: 9.9 g/dL — ABNORMAL LOW (ref 12.0–15.0)
MCH: 29.7 pg (ref 26.0–34.0)
MCHC: 33.2 g/dL (ref 30.0–36.0)
MCV: 89.5 fL (ref 80.0–100.0)
Platelets: 259 10*3/uL (ref 150–400)
RBC: 3.33 MIL/uL — ABNORMAL LOW (ref 3.87–5.11)
RDW: 13.8 % (ref 11.5–15.5)
WBC: 7.1 10*3/uL (ref 4.0–10.5)
nRBC: 0 % (ref 0.0–0.2)

## 2023-04-14 MED ORDER — IPRATROPIUM BROMIDE 0.02 % IN SOLN: 0.5000 mg | Freq: Four times a day (QID) | RESPIRATORY_TRACT | Status: DC | PRN

## 2023-04-14 MED ORDER — ALPRAZOLAM 0.25 MG PO TABS
0.2500 mg | ORAL_TABLET | Freq: Two times a day (BID) | ORAL | Status: DC
  Administered 2023-04-14 – 2023-04-15 (×3): 0.25 mg via ORAL
  Filled 2023-04-14 (×3): qty 1

## 2023-04-14 MED ORDER — OXYCODONE HCL 5 MG PO TABS
5.0000 mg | ORAL_TABLET | Freq: Every evening | ORAL | Status: DC | PRN
  Administered 2023-04-14: 5 mg via ORAL
  Filled 2023-04-14: qty 1

## 2023-04-14 MED ORDER — LEVALBUTEROL HCL 1.25 MG/0.5ML IN NEBU: 1.2500 mg | INHALATION_SOLUTION | Freq: Four times a day (QID) | RESPIRATORY_TRACT | Status: DC | PRN

## 2023-04-14 NOTE — Progress Notes (Signed)
Palliative Mrs. Woo is sitting up in the [place] chair in her room.  She appears elderly.  She greets me, making and mostly keeping eye contact.  She is alert and oriented x 3, able to make her needs known.  There is no family at bedside at this time.  We talk about her acute health concerns, her revascularization and stenting.  Mrs. Sassaman states that this was very difficult as she was not able to be put to sleep.  We talk about her symptoms.  She has not complained of loose stool, nausea vomiting, GI complaints.  She does share that she has been uncomfortable in bed and is requesting medication to help her sleep in the evenings.  We talked about the plan for short-term rehab with the ultimate goal of returning home.  Mrs. Thomasenia Sales smiled several times during our conversation.  I encouraged her in her recovery.  Conference with attending, bedside nursing staff, transition of care team related to patient condition, needs, goals of care, disposition.  Plan: At this point continue to treat the treatable but no CPR or intubation.  Agreeable to short-term rehab with the ultimate goal of returning to [facility].        35 minutes  Lillia Carmel, NP Palliative medicine team Team phone [phone]

## 2023-04-14 NOTE — Progress Notes (Signed)
Mobility Specialist Progress Note:    04/14/23 1515  Mobility  Activity Ambulated with assistance in hallway  Level of Assistance Minimal assist, patient does 75% or more  Assistive Device Front wheel walker  Distance Ambulated (ft) 170 ft  Range of Motion/Exercises Active;All extremities  Activity Response Tolerated well  Mobility Referral Yes  $Mobility charge 1 Mobility  Mobility Specialist Start Time (ACUTE ONLY) 1515  Mobility Specialist Stop Time (ACUTE ONLY) 1530  Mobility Specialist Time Calculation (min) (ACUTE ONLY) 15 min   Pt received in bed, eager for mobility. Required MinA to stand and SBA to ambulate with RW. Tolerated well, asx throughout. Returned pt to room, left on [facility] with NT. All needs met.   Lawerance Bach Mobility Specialist Please contact via Special educational needs teacher or  Rehab office at [phone]

## 2023-04-14 NOTE — Progress Notes (Signed)
Mobility Specialist Progress Note:    04/14/23 1330  Mobility  Activity Ambulated with assistance in hallway  Level of Assistance Minimal assist, patient does 75% or more  Assistive Device Front wheel walker  Distance Ambulated (ft) 100 ft  Range of Motion/Exercises Active;All extremities  Activity Response Tolerated well  Mobility Referral Yes  $Mobility charge 1 Mobility  Mobility Specialist Start Time (ACUTE ONLY) 1330  Mobility Specialist Stop Time (ACUTE ONLY) 1345  Mobility Specialist Time Calculation (min) (ACUTE ONLY) 15 min   Pt received in bed, Nurse in room. Eager for mobility, required MinA to stand and CGA to ambulate with RW. Tolerated well, c/o weakness. Returned pt to room, left supine. All needs met.   Lawerance Bach Mobility Specialist Please contact via Special educational needs teacher or  Rehab office at [phone]

## 2023-04-14 NOTE — Progress Notes (Addendum)
Gastroenterology Progress Note     Patient ID: [name]; [id number]; June 05, 1932    Subjective   Ate oatmeal for breakfast. Sitting up in chair. Eating portion of sandwich and peaches for lunch. No abdominal pain. Denying food fear/aversion currently. Still feels nervous about going to rehab. Feels she needs another day to improve. No N/V.    Objective   Vital signs in last 24 hours Temp:  [98 F (36.7 C)-98.7 F (37.1 C)] 98 F (36.7 C) (10/14 1237) Pulse Rate:  [75-131] 79 (10/14 0848) Resp:  [15-16] 16 (10/14 0848) BP: (146-205)/(33-77) 165/57 (10/14 0848) SpO2:  [94 %-98 %] 98 % (10/14 1311) Last BM Date : 04/09/23  Physical Exam General:   Alert and oriented, pleasant Head:  Normocephalic and atraumatic. Abdomen:  unable to assess as sitting up in chair with lunch tray Neurologic:  Alert and  oriented x4  Intake/Output from previous day: 10/13 0701 - 10/14 0700 In: -  Out: 750 [Urine:750] Intake/Output this shift: No intake/output data recorded.  Lab Results  Recent Labs    04/12/23 0535 04/13/23 0422 04/14/23 0457  WBC 6.1 6.9 7.1  HGB 10.7* 9.7* 9.9*  HCT 34.0* 29.7* 29.8*  PLT 237 285 259   BMET Recent Labs    04/12/23 0535 04/13/23 0422 04/14/23 0457  NA 137 133* 131*  K 4.4 3.6 3.6  CL 98 97* 95*  CO2 29 26 29   GLUCOSE 146* 136* 165*  BUN 14 13 10   CREATININE 0.74 0.65 0.72  CALCIUM 9.0 8.5* 8.5*     Studies/Results IR Angiogram Visceral Selective  Result Date: 04/11/2023 INDICATION: 89 year old female with extensive visceral ostial atherosclerotic disease and clinical presentation compatible with chronic mesenteric ischemia. EXAM: 1. Ultrasound-guided vascular access of the right common femoral vein. 2. Abdominal aortogram. 3. Selective catheterization and angiography of the superior mesenteric artery. 4. Intravascular ultrasound. 5. Superior mesenteric artery stent placement. MEDICATIONS: 10 mg hydralazine, intravenous  ANESTHESIA/SEDATION: Moderate (conscious) sedation was employed during this procedure. A total of Versed 2 mg and Fentanyl 100 mcg was administered intravenously. Moderate Sedation Time: 47 minutes. The patient's level of consciousness and vital signs were monitored continuously by radiology nursing throughout the procedure under my direct supervision. CONTRAST:  80mL OMNIPAQUE IOHEXOL 300 MG/ML  SOLN FLUOROSCOPY: Radiation Exposure Index (as provided by the fluoroscopic device): 796 mGy Kerma COMPLICATIONS: None immediate. PROCEDURE: Informed consent was obtained from the patient following explanation of the procedure, risks, benefits and alternatives. The patient understands, agrees and consents for the procedure. All questions were addressed. A time out was performed prior to the initiation of the procedure. Maximal barrier sterile technique utilized including caps, mask, sterile gowns, sterile gloves, large sterile drape, hand hygiene, and Betadine prep. Preprocedure ultrasound evaluation of the right common femoral artery demonstrated patency. The procedure was planned. Subdermal Local anesthesia was administered with 1% lidocaine. A small skin nick was made. Under direct ultrasound visualization, the right common femoral artery is punctured with a 21 gauge micropuncture needle. Permanent ultrasound image was captured and stored in the record. A micropuncture sheath was inserted and limited right lower extremity angiogram was performed which demonstrated adequate puncture site for closure device use. A J wire was then inserted and directed under fluoroscopic guidance to the proximal abdominal aorta. Surgical dilation was performed ultimately allowing placement of a 7 French, 45 cm Tour guide sheath which was positioned in the proximal abdominal aorta. A pigtail catheter was then placed and abdominal aortogram was performed and  frontal and lateral projections. Aortogram was significant for extensive  circumferential atherosclerotic disease about the aorta and iliac vessels without flow-limiting stenosis. The celiac artery and superior mesenteric artery repeat into with severe ostial stenoses. Single bilateral renal arteries appeared severely stenosed at the ostia secondary to atherosclerotic plaque, however patent distally. The inferior mesenteric artery appeared patent. The superior mesenteric artery was then selected with a 4 French Navicross catheter and Glidewire. Dedicated superior mesenteric angiogram was performed which demonstrated distal patency. AV 18 wire was inserted into the distal main superior mesenteric artery. The catheter was removed and exchanged for an 018 IVUS catheter. Intravascular ultrasound was then performed through the proximal superior mesenteric artery which demonstrated severe, approximately 95% focal stenosis at the ostium secondary to calcified atherosclerotic plaque. There was mixed fibrofatty and calcific atherosclerotic plaque through approximately the proximal 2 cm of the superior mesenteric artery. Patent vessel sizing was then performed. The V 18 was then exchanged for a Rosen wire. Over the wire, a 7 mm x 29 mm VBX stent was inserted and placed under fluoroscopic guidance. Post deployment balloon molding of the ostial portion was performed up to 9 mm. Completion superior mesenteric angiogram demonstrated wide patency of the indwelling stent and distal vessels. The sheath was removed and exchanged for an 8 [place] Angio-Seal device. This was deployed successfully achieving immediate hemostasis. Peripheral pulses were unchanged. The patient tolerated the procedure well was transferred to the recovery area in good condition. IMPRESSION: 1. 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 2. Technically successful proximal superior mesenteric artery balloon expandable, covered stent placement. Marliss Coots, MD Vascular and Interventional Radiology  Specialists [facility] Radiology Electronically Signed   By: Marliss Coots M.D.   On: 04/11/2023 21:57   IR US Guide Vasc Access Right  Result Date: 04/11/2023 INDICATION: 89 year old female with extensive visceral ostial atherosclerotic disease and clinical presentation compatible with chronic mesenteric ischemia. EXAM: 1. Ultrasound-guided vascular access of the right common femoral vein. 2. Abdominal aortogram. 3. Selective catheterization and angiography of the superior mesenteric artery. 4. Intravascular ultrasound. 5. Superior mesenteric artery stent placement. MEDICATIONS: 10 mg hydralazine, intravenous ANESTHESIA/SEDATION: Moderate (conscious) sedation was employed during this procedure. A total of Versed 2 mg and Fentanyl 100 mcg was administered intravenously. Moderate Sedation Time: 47 minutes. The patient's level of consciousness and vital signs were monitored continuously by radiology nursing throughout the procedure under my direct supervision. CONTRAST:  80mL OMNIPAQUE IOHEXOL 300 MG/ML  SOLN FLUOROSCOPY: Radiation Exposure Index (as provided by the fluoroscopic device): 796 mGy Kerma COMPLICATIONS: None immediate. PROCEDURE: Informed consent was obtained from the patient following explanation of the procedure, risks, benefits and alternatives. The patient understands, agrees and consents for the procedure. All questions were addressed. A time out was performed prior to the initiation of the procedure. Maximal barrier sterile technique utilized including caps, mask, sterile gowns, sterile gloves, large sterile drape, hand hygiene, and Betadine prep. Preprocedure ultrasound evaluation of the right common femoral artery demonstrated patency. The procedure was planned. Subdermal Local anesthesia was administered with 1% lidocaine. A small skin nick was made. Under direct ultrasound visualization, the right common femoral artery is punctured with a 21 gauge micropuncture needle. Permanent ultrasound  image was captured and stored in the record. A micropuncture sheath was inserted and limited right lower extremity angiogram was performed which demonstrated adequate puncture site for closure device use. A J wire was then inserted and directed under fluoroscopic guidance to the proximal abdominal aorta. Surgical dilation was  performed ultimately allowing placement of a 7 French, 45 cm Tour guide sheath which was positioned in the proximal abdominal aorta. A pigtail catheter was then placed and abdominal aortogram was performed and frontal and lateral projections. Aortogram was significant for extensive circumferential atherosclerotic disease about the aorta and iliac vessels without flow-limiting stenosis. The celiac artery and superior mesenteric artery repeat into with severe ostial stenoses. Single bilateral renal arteries appeared severely stenosed at the ostia secondary to atherosclerotic plaque, however patent distally. The inferior mesenteric artery appeared patent. The superior mesenteric artery was then selected with a 4 French Navicross catheter and Glidewire. Dedicated superior mesenteric angiogram was performed which demonstrated distal patency. AV 18 wire was inserted into the distal main superior mesenteric artery. The catheter was removed and exchanged for an 018 IVUS catheter. Intravascular ultrasound was then performed through the proximal superior mesenteric artery which demonstrated severe, approximately 95% focal stenosis at the ostium secondary to calcified atherosclerotic plaque. There was mixed fibrofatty and calcific atherosclerotic plaque through approximately the proximal 2 cm of the superior mesenteric artery. Patent vessel sizing was then performed. The V 18 was then exchanged for a Rosen wire. Over the wire, a 7 mm x 29 mm VBX stent was inserted and placed under fluoroscopic guidance. Post deployment balloon molding of the ostial portion was performed up to 9 mm. Completion superior  mesenteric angiogram demonstrated wide patency of the indwelling stent and distal vessels. The sheath was removed and exchanged for an 8 [place] Angio-Seal device. This was deployed successfully achieving immediate hemostasis. Peripheral pulses were unchanged. The patient tolerated the procedure well was transferred to the recovery area in good condition. IMPRESSION: 1. 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 2. Technically successful proximal superior mesenteric artery balloon expandable, covered stent placement. Marliss Coots, MD Vascular and Interventional Radiology Specialists [facility] Radiology Electronically Signed   By: Marliss Coots M.D.   On: 04/11/2023 21:57   IR TRANSCATH PLC STENT 1ST ART NOT LE CV CAR VERT CAR  Result Date: 04/11/2023 INDICATION: 89 year old female with extensive visceral ostial atherosclerotic disease and clinical presentation compatible with chronic mesenteric ischemia. EXAM: 1. Ultrasound-guided vascular access of the right common femoral vein. 2. Abdominal aortogram. 3. Selective catheterization and angiography of the superior mesenteric artery. 4. Intravascular ultrasound. 5. Superior mesenteric artery stent placement. MEDICATIONS: 10 mg hydralazine, intravenous ANESTHESIA/SEDATION: Moderate (conscious) sedation was employed during this procedure. A total of Versed 2 mg and Fentanyl 100 mcg was administered intravenously. Moderate Sedation Time: 47 minutes. The patient's level of consciousness and vital signs were monitored continuously by radiology nursing throughout the procedure under my direct supervision. CONTRAST:  80mL OMNIPAQUE IOHEXOL 300 MG/ML  SOLN FLUOROSCOPY: Radiation Exposure Index (as provided by the fluoroscopic device): 796 mGy Kerma COMPLICATIONS: None immediate. PROCEDURE: Informed consent was obtained from the patient following explanation of the procedure, risks, benefits and alternatives. The patient understands, agrees and  consents for the procedure. All questions were addressed. A time out was performed prior to the initiation of the procedure. Maximal barrier sterile technique utilized including caps, mask, sterile gowns, sterile gloves, large sterile drape, hand hygiene, and Betadine prep. Preprocedure ultrasound evaluation of the right common femoral artery demonstrated patency. The procedure was planned. Subdermal Local anesthesia was administered with 1% lidocaine. A small skin nick was made. Under direct ultrasound visualization, the right common femoral artery is punctured with a 21 gauge micropuncture needle. Permanent ultrasound image was captured and stored in the record. A micropuncture sheath  was inserted and limited right lower extremity angiogram was performed which demonstrated adequate puncture site for closure device use. A J wire was then inserted and directed under fluoroscopic guidance to the proximal abdominal aorta. Surgical dilation was performed ultimately allowing placement of a 7 French, 45 cm Tour guide sheath which was positioned in the proximal abdominal aorta. A pigtail catheter was then placed and abdominal aortogram was performed and frontal and lateral projections. Aortogram was significant for extensive circumferential atherosclerotic disease about the aorta and iliac vessels without flow-limiting stenosis. The celiac artery and superior mesenteric artery repeat into with severe ostial stenoses. Single bilateral renal arteries appeared severely stenosed at the ostia secondary to atherosclerotic plaque, however patent distally. The inferior mesenteric artery appeared patent. The superior mesenteric artery was then selected with a 4 French Navicross catheter and Glidewire. Dedicated superior mesenteric angiogram was performed which demonstrated distal patency. AV 18 wire was inserted into the distal main superior mesenteric artery. The catheter was removed and exchanged for an 018 IVUS catheter.  Intravascular ultrasound was then performed through the proximal superior mesenteric artery which demonstrated severe, approximately 95% focal stenosis at the ostium secondary to calcified atherosclerotic plaque. There was mixed fibrofatty and calcific atherosclerotic plaque through approximately the proximal 2 cm of the superior mesenteric artery. Patent vessel sizing was then performed. The V 18 was then exchanged for a Rosen wire. Over the wire, a 7 mm x 29 mm VBX stent was inserted and placed under fluoroscopic guidance. Post deployment balloon molding of the ostial portion was performed up to 9 mm. Completion superior mesenteric angiogram demonstrated wide patency of the indwelling stent and distal vessels. The sheath was removed and exchanged for an 8 [place] Angio-Seal device. This was deployed successfully achieving immediate hemostasis. Peripheral pulses were unchanged. The patient tolerated the procedure well was transferred to the recovery area in good condition. IMPRESSION: 1. 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 2. Technically successful proximal superior mesenteric artery balloon expandable, covered stent placement. Marliss Coots, MD Vascular and Interventional Radiology Specialists [facility] Radiology Electronically Signed   By: Marliss Coots M.D.   On: 04/11/2023 21:57   IR INTRAVASCULAR ULTRASOUND NON CORONARY  Result Date: 04/11/2023 INDICATION: 89 year old female with extensive visceral ostial atherosclerotic disease and clinical presentation compatible with chronic mesenteric ischemia. EXAM: 1. Ultrasound-guided vascular access of the right common femoral vein. 2. Abdominal aortogram. 3. Selective catheterization and angiography of the superior mesenteric artery. 4. Intravascular ultrasound. 5. Superior mesenteric artery stent placement. MEDICATIONS: 10 mg hydralazine, intravenous ANESTHESIA/SEDATION: Moderate (conscious) sedation was employed during this  procedure. A total of Versed 2 mg and Fentanyl 100 mcg was administered intravenously. Moderate Sedation Time: 47 minutes. The patient's level of consciousness and vital signs were monitored continuously by radiology nursing throughout the procedure under my direct supervision. CONTRAST:  80mL OMNIPAQUE IOHEXOL 300 MG/ML  SOLN FLUOROSCOPY: Radiation Exposure Index (as provided by the fluoroscopic device): 796 mGy Kerma COMPLICATIONS: None immediate. PROCEDURE: Informed consent was obtained from the patient following explanation of the procedure, risks, benefits and alternatives. The patient understands, agrees and consents for the procedure. All questions were addressed. A time out was performed prior to the initiation of the procedure. Maximal barrier sterile technique utilized including caps, mask, sterile gowns, sterile gloves, large sterile drape, hand hygiene, and Betadine prep. Preprocedure ultrasound evaluation of the right common femoral artery demonstrated patency. The procedure was planned. Subdermal Local anesthesia was administered with 1% lidocaine. A small skin nick was  made. Under direct ultrasound visualization, the right common femoral artery is punctured with a 21 gauge micropuncture needle. Permanent ultrasound image was captured and stored in the record. A micropuncture sheath was inserted and limited right lower extremity angiogram was performed which demonstrated adequate puncture site for closure device use. A J wire was then inserted and directed under fluoroscopic guidance to the proximal abdominal aorta. Surgical dilation was performed ultimately allowing placement of a 7 French, 45 cm Tour guide sheath which was positioned in the proximal abdominal aorta. A pigtail catheter was then placed and abdominal aortogram was performed and frontal and lateral projections. Aortogram was significant for extensive circumferential atherosclerotic disease about the aorta and iliac vessels without  flow-limiting stenosis. The celiac artery and superior mesenteric artery repeat into with severe ostial stenoses. Single bilateral renal arteries appeared severely stenosed at the ostia secondary to atherosclerotic plaque, however patent distally. The inferior mesenteric artery appeared patent. The superior mesenteric artery was then selected with a 4 French Navicross catheter and Glidewire. Dedicated superior mesenteric angiogram was performed which demonstrated distal patency. AV 18 wire was inserted into the distal main superior mesenteric artery. The catheter was removed and exchanged for an 018 IVUS catheter. Intravascular ultrasound was then performed through the proximal superior mesenteric artery which demonstrated severe, approximately 95% focal stenosis at the ostium secondary to calcified atherosclerotic plaque. There was mixed fibrofatty and calcific atherosclerotic plaque through approximately the proximal 2 cm of the superior mesenteric artery. Patent vessel sizing was then performed. The V 18 was then exchanged for a Rosen wire. Over the wire, a 7 mm x 29 mm VBX stent was inserted and placed under fluoroscopic guidance. Post deployment balloon molding of the ostial portion was performed up to 9 mm. Completion superior mesenteric angiogram demonstrated wide patency of the indwelling stent and distal vessels. The sheath was removed and exchanged for an 8 [place] Angio-Seal device. This was deployed successfully achieving immediate hemostasis. Peripheral pulses were unchanged. The patient tolerated the procedure well was transferred to the recovery area in good condition. IMPRESSION: 1. 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 2. Technically successful proximal superior mesenteric artery balloon expandable, covered stent placement. Marliss Coots, MD Vascular and Interventional Radiology Specialists [facility] Radiology Electronically Signed   By: Marliss Coots M.D.   On:  04/11/2023 21:57   [place] MESENTERIC ARTERIES  Result Date: 04/11/2023 CLINICAL DATA:  Intestinal angina, weight loss and aortic and visceral atherosclerosis by CT angiography. EXAM: [place] MESENTERIC ARTERIAL DOPPLER COMPARISON:  CTA of the abdomen and pelvis on 04/10/2023 FINDINGS: Celiac axis: 318 proximal cm/sec, 237 centimeters/second distal. Spectral broadening of celiac waveforms. Celiac axis with inspiration: 252 cm/sec Celiac axis with expiration: 176 cm/sec Splenic artery: 61 cm/sec Hepatic artery: 113 cm/sec SMA: 306 proximal, 183 mid, 128 distal cm/sec. Spectral broadening of SMA waveforms. IMA: 247 cm/sec Aorta: 181 cm/sec Aortic size: 1.9 cm proximally, 1.4 cm in the mid segment and 1.3 cm distally IMPRESSION: Findings suggestive of probable hemodynamically significant stenoses of greater than 70% involving proximal origins of the celiac axis and superior mesenteric artery. Electronically Signed   By: Irish Lack M.D.   On: 04/11/2023 13:36   CT Angio Abd/Pel w/ and/or w/o  Result Date: 04/11/2023 CLINICAL DATA:  Mesenteric ischemia, acute. Follow-up stenosis on previous examination. History of abdominal pain and nausea. EXAM: CTA ABDOMEN AND PELVIS WITHOUT AND WITH CONTRAST TECHNIQUE: Multidetector CT imaging of the abdomen and pelvis was performed using the standard protocol during  bolus administration of intravenous contrast. Multiplanar reconstructed images and MIPs were obtained and reviewed to evaluate the vascular anatomy. RADIATION DOSE REDUCTION: This exam was performed according to the departmental dose-optimization program which includes automated exposure control, adjustment of the mA and/or kV according to patient size and/or use of iterative reconstruction technique. CONTRAST:  75mL OMNIPAQUE IOHEXOL 350 MG/ML SOLN COMPARISON:  03/07/2023 FINDINGS: VASCULAR Aorta: Atherosclerotic disease in the abdominal aorta without aneurysm, dissection or significant aortic stenosis. Celiac:  Approximately 50% stenosis at the origin of the celiac artery trunk is similar to the previous examination. Main branch vessels are patent. SMA: Atherosclerotic disease involving the origin and proximal SMA without significant stenosis. Renals: Single right renal artery is patent with at least mild stenosis at the origin. Three left renal arteries. Moderate stenosis involving the origin of the superior left renal artery. No significant stenosis involving the middle left renal artery. There may be mild stenosis involving the inferior left renal artery. IMA: Patent with at least mild stenosis at the origin. Inflow: Common, internal and external iliac arteries are patent without significant stenosis. No evidence for aneurysm or dissection involving the iliac arteries. Proximal Outflow: Proximal femoral arteries are patent bilaterally. Veins: Portal venous system is patent. IVC and renal veins are patent. Iliac veins and proximal femoral veins are patent. Review of the MIP images confirms the above findings. NON-VASCULAR Lower chest: Bronchiectasis and atelectasis at the lung bases. Hepatobiliary: Stable 8 mm hypodensity in the right hepatic lobe on image 19/15 is likely an incidental finding. No suspicious hepatic lesions. No biliary dilatation. Gallbladder is distended with multiple stones. No inflammatory changes around the gallbladder. Pancreas: Unremarkable. No pancreatic ductal dilatation or surrounding inflammatory changes. Spleen: Normal in size without focal abnormality. Adrenals/Urinary Tract: Adrenal glands are within normal limits. Small hypodensities in both kidneys that likely represent small cysts and do not require dedicated follow-up. Urinary bladder is distended. Stomach/Bowel: Normal appearance of the stomach. Normal appearance of the small bowel without dilatation. Normal appearance of the colon. No evidence for bowel wall thickening or inflammation. No evidence for an obstructive process. Lymphatic:  No lymph node enlargement in the abdomen or pelvis. Reproductive: Status post hysterectomy. No adnexal masses. Other: Negative for free fluid. Surgical mesh material along the anterior abdominal wall and anterior aspect of the pelvis. Musculoskeletal: No acute bone abnormality. IMPRESSION: VASCULAR 1. Main mesenteric arteries are patent. Approximately 50% stenosis at the origin of the celiac artery trunk. No significant stenosis involving the superior mesenteric artery. At least mild stenosis involving the origin of the inferior mesenteric artery. 2. Bilateral renal artery stenosis. Multiple left renal arteries as described. 3.  Aortic Atherosclerosis (ICD10-I70.0). NON-VASCULAR 1. No acute abnormality in the abdomen or pelvis. 2. Gallbladder is mildly distended with gallstones. No evidence for acute inflammation. 3. Distended urinary bladder without hydronephrosis. 4. Postoperative changes along the anterior abdominal wall with surgical mesh material. 5. Normal appearance of the bowel without evidence for obstruction or inflammatory changes. Electronically Signed   By: Richarda Overlie M.D.   On: 04/11/2023 07:31   DG CHEST PORT 1 VIEW  Result Date: 04/10/2023 CLINICAL DATA:  Cough and weakness. EXAM: PORTABLE CHEST 1 VIEW COMPARISON:  Chest radiographs 04/07/2023, 04/06/2023, 03/16/2023; CT chest 02/25/2021 FINDINGS: Cardiac silhouette is again moderately enlarged. Mediastinal contours are within normal limits. Mild calcification within aortic arch. Mild left-greater-than-right horizontal linear basilar densities appear slightly decreased on the right and unchanged on the left from most recent 04/07/2023 radiographs. Improved aeration of the  right lung base. No definite pleural effusion. No pneumothorax. No acute skeletal abnormality. IMPRESSION: Improved aeration of the right lung base. Bibasilar horizontal linear likely scarring. It is again difficult to exclude mild left basilar retrocardiac airspace  opacification, possible aspiration versus pneumonia as on recent 04/07/2023 radiograph. Electronically Signed   By: Neita Garnet M.D.   On: 04/10/2023 13:58   DG Chest Portable 1 View  Result Date: 04/07/2023 CLINICAL DATA:  Shortness of breath EXAM: PORTABLE CHEST 1 VIEW COMPARISON:  Chest x-ray dated April 06, 2023 FINDINGS: Patient rotation to the right somewhat limits evaluation. Cardiac and mediastinal contours are within normal limits when accounting for patient rotation. Increased right greater than left basilar opacities. No large pleural effusion or evidence of pneumothorax. IMPRESSION: Increased right greater than left basilar opacities, concerning for infection or aspiration. Recommend follow-up PA and lateral chest radiograph in 6-8 weeks to ensure resolution. Electronically Signed   By: Allegra Lai M.D.   On: 04/07/2023 15:55   DG Chest Portable 1 View  Result Date: 04/06/2023 CLINICAL DATA:  Cough.  COVID positive. EXAM: PORTABLE CHEST 1 VIEW COMPARISON:  03/16/2023 FINDINGS: AP portable radiograph with mild apical lordotic positioning. Numerous leads and wires project over the chest. Midline trachea. Mild cardiomegaly. Atherosclerosis in the transverse aorta. No pleural effusion or pneumothorax. Medial right lung base opacity is similar and likely due to scarring. There may be increased density along the left heart border and lateral left hemidiaphragm. No congestive failure. IMPRESSION: Possible increased density along the left heart border and lateral left hemidiaphragm. Cannot exclude left base airspace disease. Consider further evaluation with PA and lateral radiographs. Cardiomegaly without congestive failure. Aortic Atherosclerosis (ICD10-I70.0). Electronically Signed   By: Jeronimo Greaves M.D.   On: 04/06/2023 10:01   DG Chest Portable 1 View  Result Date: 03/16/2023 CLINICAL DATA:  Pt reported urinary frequency and not fully emptying bladder, with abdominal pressure. Recently  seen for UTI and given antibiotics. EXAM: PORTABLE CHEST 1 VIEW COMPARISON:  03/07/2023 and older studies.  CT, 02/25/2021. FINDINGS: Cardiac silhouette mildly enlarged.  No mediastinal or hilar masses. Lungs are hyperexpanded. Mild linear atelectasis or scarring noted in the right lung base. No lung consolidation or edema. No convincing pleural effusion and no pneumothorax. Skeletal structures are demineralized, grossly intact. IMPRESSION: No acute cardiopulmonary disease. Electronically Signed   By: Amie Portland M.D.   On: 03/16/2023 11:56    Assessment  89 y.o. female admitted with progressive weakness, poor appetite, pneumonia, and GI consulted for abdominal pain and poor oral intake.  EGD this admission with non-obstructing Schatzki ring, normal duodenum, erythematous mucosa of antrum biopsied. 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   Clinically improving and tolerating heart healthy diet without food fear or aversion today. Appreciate IR's assistance with this nice lady.       Plan / Recommendations  Continue heart healthy diet Can decrease PPI to once daily GI signing off. No further recommendations.  Will follow-up on surgical pathology as comes available.  Follow-up outpatient with IR   LOS: 7 days    04/14/2023, 1:14 PM  Gelene Mink, PhD, [facility] Gastroenterology

## 2023-04-14 NOTE — Progress Notes (Signed)
PROGRESS NOTE    [name]  ZOX:[id number] DOB: 1931-10-03 DOA: 04/07/2023 PCP: Babs Sciara, MD   Brief Narrative:    [name] is a vibrant 89 y.o. 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 knee replacement in 2023 who was walking a mile a day at the end of last year.  Earlier this year she was down to half a mile a day.  In the last 3 months the patient has been plagued with intermittent diarrhea, poor appetite, and progressive weakness.  She appears to have hyponatremia that is improving as well as community-acquired pneumonia seen on chest x-ray.  She is also noted to have increasing weight loss and malaise that may be related to mesenteric ischemia.  GI asked to evaluate for this condition to see if IR intervention could be beneficial.  Palliative also consulted for further goals of care discussion.    Subjective: The patient was seen and examined this morning, still complaining of severe generalized weaknesses, unable to advance her diet due to poor appetite but reports of improved abdominal pain, denies any nausea vomiting  04/11/2023 s/p treatment of SMA stenosis with angiography and stenting at [facility]  Assessment & Plan:   Principal Problem:   Mesenteric artery stenosis [facility]) Active Problems:   Abdominal pain, chronic, epigastric   Gastritis and gastroduodenitis  Assessment and Plan:  Abdominal pain /mesenteric ischemia with high-grade SMA stenosis --Improved abdominal pain  -Due to mesenteric ischemia with high-grade stenosis to SMA as well as 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 Marliss Coots   -Abdominal pain has improved, advancing diet slowly unable to advance quickly due to reduced appetite -Improved  abdominal pain, advancing diet slowly today -GI following  -Status post repeat CTA, revealing. Approximately 50% stenosis at the origin of the celiac artery trunk. No significant stenosis involving the superior mesenteric artery. At least mild stenosis involving the origin of the inferior mesenteric artery. 2. Bilateral renal artery stenosis. Multiple left renal arteries as described.  -Consult GI and IR Dr. Elby Showers on 04/11/2023  -On PPI added ASA  -POA  postprandial Pain, burning sensation, 8 pound weight loss   Community-acquired pneumonia Improved shortness of breath, wheezing -Status post treatment with IV steroids, off -Discontinued IV azithromycin/Rocephin, on p.o. Levaquin  -Legionella neg  Hyponatremia-resolved Monitoring serum sodium -S/p  IVF  Recurrent diarrhea-, resolved -Monitoring BMs -On fiber supplementation and hold antimotility agents  Concern for failure to thrive -Appears to have poor long-term prognosis and does not appear to want aggressive measures -Appreciate palliative consultation -Added  Megace   Depression  -Very withdrawn, depressed -In scheduling Xanax     DVT prophylaxis:Lovenox Code Status: DNR Family Communication: Discussed with her son at bedside Disposition Plan:  Status is: Inpatient Remains inpatient appropriate because: Need for IV medications and inpt procedure Disposition-likely SNF family requested [facility] -TOC consulted  Likely discharge in a.m. 04/15/2023  Consultants:  GI Palliative  Procedures:  CTA   Antimicrobials:  Anti-infectives (From admission, onward)    Start     Dose/Rate Route Frequency Ordered Stop   04/13/23 1600  levofloxacin (LEVAQUIN) tablet 500 mg        500 mg Oral Daily 04/13/23 0757     04/08/23 1600  azithromycin (ZITHROMAX) 500 mg in sodium  chloride 0.9 % 250 mL IVPB  Status:  Discontinued        500 mg 250 mL/hr over 60 Minutes Intravenous Every 24 hours 04/07/23 2043 04/13/23 0757    04/08/23 0530  cefTRIAXone (ROCEPHIN) 2 g in sodium chloride 0.9 % 100 mL IVPB  Status:  Discontinued        2 g 200 mL/hr over 30 Minutes Intravenous Every 24 hours 04/07/23 2042 04/13/23 0757   04/07/23 1545  cefTRIAXone (ROCEPHIN) 1 g in sodium chloride 0.9 % 100 mL IVPB        1 g 200 mL/hr over 30 Minutes Intravenous  Once 04/07/23 1535 04/07/23 1618   04/07/23 1545  azithromycin (ZITHROMAX) 500 mg in sodium chloride 0.9 % 250 mL IVPB        500 mg 250 mL/hr over 60 Minutes Intravenous  Once 04/07/23 1535 04/07/23 1724        Objective: Vitals:   04/14/23 0435 04/14/23 0436 04/14/23 0732 04/14/23 0848  BP: (!) 146/33   (!) 165/57  Pulse: 91 91  79  Resp: 15   16  Temp: 98.3 F (36.8 C)   98.2 F (36.8 C)  TempSrc: Oral   Oral  SpO2: 97% 94% 95% 97%  Weight:      Height:        Intake/Output Summary (Last 24 hours) at 04/14/2023 1143 Last data filed at 04/13/2023 1800 Gross per 24 hour  Intake --  Output 750 ml  Net -750 ml    Filed Weights   04/08/23 0637 04/11/23 1800  Weight: 72.6 kg 74.1 kg          General:  AAO x 3,  cooperative, no distress;   HEENT:  Normocephalic, PERRL, otherwise with in Normal limits   Neuro:  CNII-XII intact. , normal motor and sensation, reflexes intact   Lungs:   Clear to auscultation BL, Respirations unlabored,  No wheezes / crackles  Cardio:    S1/S2, RRR, No murmure, No Rubs or Gallops   Abdomen:  Soft, non-tender, bowel sounds active all four quadrants, no guarding or peritoneal signs.  Muscular  skeletal:  Limited exam -global generalized weaknesses - in bed, able to move all 4 extremities,   2+ pulses,  symmetric, No pitting edema  Skin:  Dry, warm to touch, negative for any Rashes,  Wounds: Please see nursing documentation           Data Reviewed: I have personally reviewed following labs and imaging studies  CBC: Recent Labs  Lab 04/07/23 1455 04/09/23 0437 04/11/23 0432 04/12/23 0535  04/13/23 0422 04/14/23 0457  WBC 7.2 8.8 8.6 6.1 6.9 7.1  NEUTROABS 4.7  --   --   --   --   --   HGB 11.7* 11.3* 10.5* 10.7* 9.7* 9.9*  HCT 35.8* 35.4* 32.6* 34.0* 29.7* 29.8*  MCV 88.8 90.3 91.1 92.1 91.1 89.5  PLT 245 275 277 237 285 259   Basic Metabolic Panel: Recent Labs  Lab 04/09/23 0437 04/11/23 0432 04/12/23 0535 04/13/23 0422 04/14/23 0457  NA 133* 132* 137 133* 131*  K 3.5 3.7 4.4 3.6 3.6  CL 94* 94* 98 97* 95*  CO2 29 29 29 26 29   GLUCOSE 128* 128* 146* 136* 165*  BUN 16 15 14 13 10   CREATININE 0.80 0.79 0.74 0.65 0.72  CALCIUM 9.0 8.9 9.0 8.5* 8.5*  MG 1.7  --   --   --   --    GFR:  Estimated Creatinine Clearance: 48.1 mL/min (by C-G formula based on SCr of 0.72 mg/dL). Liver Function Tests: Recent Labs  Lab 04/07/23 1455 04/08/23 0247 04/09/23 0437  AST 17 17 13*  ALT 15 14 14   ALKPHOS 68 61 58  BILITOT 0.6 1.0 0.8  PROT 6.3* 6.0* 6.0*  ALBUMIN 3.5 3.3* 3.1*   CBG: Recent Labs  Lab 04/12/23 2017 04/13/23 1117 04/13/23 1624 04/13/23 2144 04/14/23 0753  GLUCAP 127* 161* 162* 166* 176*    Thyroid Function Tests: No results for input(s): "TSH", "T4TOTAL", "FREET4", "T3FREE", "THYROIDAB" in the last 72 hours.  Anemia Panel:   Recent Results (from the past 240 hour(s))  SARS Coronavirus 2 by RT PCR (hospital order, performed in [facility] hospital lab) *cepheid single result test* Anterior Nasal Swab     Status: None   Collection Time: 04/06/23  9:00 AM   Specimen: Anterior Nasal Swab  Result Value Ref Range Status   SARS Coronavirus 2 by RT PCR NEGATIVE NEGATIVE Final    Comment: (NOTE) SARS-CoV-2 target nucleic acids are NOT DETECTED.  The SARS-CoV-2 RNA is generally detectable in upper and lower respiratory specimens during the acute phase of infection. The lowest concentration of SARS-CoV-2 viral copies this assay can detect is 250 copies / mL. A negative result does not preclude SARS-CoV-2 infection and should not be used as the  sole basis for treatment or other patient management decisions.  A negative result may occur with improper specimen collection / handling, submission of specimen other than nasopharyngeal swab, presence of viral mutation(s) within the areas targeted by this assay, and inadequate number of viral copies (<250 copies / mL). A negative result must be combined with clinical observations, patient history, and epidemiological information.  Fact Sheet for Patients:   RoadLapTop.co.za  Fact Sheet for Healthcare Providers: http://kim-miller.com/  This test is not yet approved or  cleared by the [place] FDA and has been authorized for detection and/or diagnosis of SARS-CoV-2 by FDA under an Emergency Use Authorization (EUA).  This EUA will remain in effect (meaning this test can be used) for the duration of the COVID-19 declaration under Section 564(b)(1) of the Act, 21 U.S.C. section 360bbb-3(b)(1), unless the authorization is terminated or revoked sooner.  Performed at [facility], [address], [place], [place] [address]   SARS Coronavirus 2 by RT PCR (hospital order, performed in [facility] hospital lab) *cepheid single result test*     Status: None   Collection Time: 04/07/23  9:09 PM  Result Value Ref Range Status   SARS Coronavirus 2 by RT PCR NEGATIVE NEGATIVE Final    Comment: (NOTE) SARS-CoV-2 target nucleic acids are NOT DETECTED.  The SARS-CoV-2 RNA is generally detectable in upper and lower respiratory specimens during the acute phase of infection. The lowest concentration of SARS-CoV-2 viral copies this assay can detect is 250 copies / mL. A negative result does not preclude SARS-CoV-2 infection and should not be used as the sole basis for treatment or other patient management decisions.  A negative result may occur with improper specimen collection / handling, submission of specimen other than nasopharyngeal swab, presence  of viral mutation(s) within the areas targeted by this assay, and inadequate number of viral copies (<250 copies / mL). A negative result must be combined with clinical observations, patient history, and epidemiological information.  Fact Sheet for Patients:   RoadLapTop.co.za  Fact Sheet for Healthcare Providers: http://kim-miller.com/  This test is not yet approved or  cleared by the  [place] Fut[employer] trader and has been authorized for detection and/or diagnosis of SARS-CoV-2 by FDA under an TEFL teacher (EUA).  This EUA will remain in effect (meaning this test can be used) for the duration of the COVID-19 declaration under Section 564(b)(1) of the Act, 21 U.S.C. section 360bbb-3(b)(1), unless the authorization is terminated or revoked sooner.  Performed at [facility], [address] [place], [place] [address]   MRSA Next Gen by PCR, Nasal     Status: None   Collection Time: 04/08/23  7:00 AM   Specimen: Nasal Mucosa; Nasal Swab  Result Value Ref Range Status   MRSA by PCR Next Gen NOT DETECTED NOT DETECTED Final    Comment: (NOTE) The GeneXpert MRSA Assay (FDA approved for NASAL specimens only), is one component of a comprehensive MRSA colonization surveillance program. It is not intended to diagnose MRSA infection nor to guide or monitor treatment for MRSA infections. Test performance is not FDA approved in patients less than 30 years old. Performed at [facility], [address] [place], [place] [address]   MRSA Next Gen by PCR, Nasal     Status: None   Collection Time: 04/11/23  5:57 PM   Specimen: Nasal Mucosa; Nasal Swab  Result Value Ref Range Status   MRSA by PCR Next Gen NOT DETECTED NOT DETECTED Final    Comment: (NOTE) The GeneXpert MRSA Assay (FDA approved for NASAL specimens only), is one component of a comprehensive MRSA colonization surveillance program. It is not intended to diagnose MRSA infection nor  to guide or monitor treatment for MRSA infections. Test performance is not FDA approved in patients less than 49 years old. Performed at [facility], [address] [place], [place] [address]          Radiology Studies: No results found.      Scheduled Meds:  ALPRAZolam  0.25 mg Oral BID   amLODipine  10 mg Oral Daily   aspirin  324 mg Oral Daily   cycloSPORINE  1 drop Both Eyes BID   enoxaparin (LOVENOX) injection  40 mg Subcutaneous Q24H   guaiFENesin-dextromethorphan  10 mL Oral Q8H   ipratropium  0.5 mg Nebulization Q6H WA   levalbuterol  1.25 mg Nebulization Q6H WA   levofloxacin  500 mg Oral Daily   megestrol  400 mg Oral Daily   pantoprazole  40 mg Oral BID AC   sertraline  50 mg Oral Daily   Continuous Infusions:     LOS: 7 days    Time spent: 55 minutes    Kendell Bane, MD [facility]  If 7PM-7AM, please contact night-coverage [email or web address] 04/14/2023, 11:43 AM

## 2023-04-15 DIAGNOSIS — E871 Hypo-osmolality and hyponatremia: Secondary | ICD-10-CM | POA: Diagnosis not present

## 2023-04-15 DIAGNOSIS — E1169 Type 2 diabetes mellitus with other specified complication: Secondary | ICD-10-CM | POA: Diagnosis not present

## 2023-04-15 DIAGNOSIS — F0390 Unspecified dementia without behavioral disturbance: Secondary | ICD-10-CM | POA: Diagnosis not present

## 2023-04-15 DIAGNOSIS — Z741 Need for assistance with personal care: Secondary | ICD-10-CM | POA: Diagnosis not present

## 2023-04-15 DIAGNOSIS — E785 Hyperlipidemia, unspecified: Secondary | ICD-10-CM | POA: Diagnosis not present

## 2023-04-15 DIAGNOSIS — R488 Other symbolic dysfunctions: Secondary | ICD-10-CM | POA: Diagnosis not present

## 2023-04-15 DIAGNOSIS — Z7189 Other specified counseling: Secondary | ICD-10-CM | POA: Diagnosis not present

## 2023-04-15 DIAGNOSIS — D62 Acute posthemorrhagic anemia: Secondary | ICD-10-CM | POA: Diagnosis not present

## 2023-04-15 DIAGNOSIS — R531 Weakness: Secondary | ICD-10-CM | POA: Diagnosis not present

## 2023-04-15 DIAGNOSIS — R1030 Lower abdominal pain, unspecified: Secondary | ICD-10-CM | POA: Diagnosis not present

## 2023-04-15 DIAGNOSIS — M6281 Muscle weakness (generalized): Secondary | ICD-10-CM | POA: Diagnosis not present

## 2023-04-15 DIAGNOSIS — E43 Unspecified severe protein-calorie malnutrition: Secondary | ICD-10-CM | POA: Diagnosis not present

## 2023-04-15 DIAGNOSIS — R262 Difficulty in walking, not elsewhere classified: Secondary | ICD-10-CM | POA: Diagnosis not present

## 2023-04-15 DIAGNOSIS — K55059 Acute (reversible) ischemia of intestine, part and extent unspecified: Secondary | ICD-10-CM | POA: Diagnosis not present

## 2023-04-15 DIAGNOSIS — K2970 Gastritis, unspecified, without bleeding: Secondary | ICD-10-CM | POA: Diagnosis not present

## 2023-04-15 DIAGNOSIS — K2990 Gastroduodenitis, unspecified, without bleeding: Secondary | ICD-10-CM | POA: Diagnosis not present

## 2023-04-15 DIAGNOSIS — E1159 Type 2 diabetes mellitus with other circulatory complications: Secondary | ICD-10-CM | POA: Diagnosis not present

## 2023-04-15 DIAGNOSIS — Z433 Encounter for attention to colostomy: Secondary | ICD-10-CM | POA: Diagnosis not present

## 2023-04-15 DIAGNOSIS — I152 Hypertension secondary to endocrine disorders: Secondary | ICD-10-CM | POA: Diagnosis not present

## 2023-04-15 DIAGNOSIS — K551 Chronic vascular disorders of intestine: Secondary | ICD-10-CM | POA: Diagnosis not present

## 2023-04-15 DIAGNOSIS — K5792 Diverticulitis of intestine, part unspecified, without perforation or abscess without bleeding: Secondary | ICD-10-CM | POA: Diagnosis not present

## 2023-04-15 DIAGNOSIS — J3089 Other allergic rhinitis: Secondary | ICD-10-CM | POA: Diagnosis not present

## 2023-04-15 DIAGNOSIS — Z515 Encounter for palliative care: Secondary | ICD-10-CM | POA: Diagnosis not present

## 2023-04-15 DIAGNOSIS — K219 Gastro-esophageal reflux disease without esophagitis: Secondary | ICD-10-CM | POA: Diagnosis not present

## 2023-04-15 DIAGNOSIS — K649 Unspecified hemorrhoids: Secondary | ICD-10-CM | POA: Diagnosis not present

## 2023-04-15 DIAGNOSIS — J189 Pneumonia, unspecified organism: Secondary | ICD-10-CM | POA: Diagnosis not present

## 2023-04-15 DIAGNOSIS — E1142 Type 2 diabetes mellitus with diabetic polyneuropathy: Secondary | ICD-10-CM | POA: Diagnosis not present

## 2023-04-15 DIAGNOSIS — M159 Polyosteoarthritis, unspecified: Secondary | ICD-10-CM | POA: Diagnosis not present

## 2023-04-15 DIAGNOSIS — F419 Anxiety disorder, unspecified: Secondary | ICD-10-CM | POA: Diagnosis not present

## 2023-04-15 DIAGNOSIS — R634 Abnormal weight loss: Secondary | ICD-10-CM | POA: Diagnosis not present

## 2023-04-15 DIAGNOSIS — F411 Generalized anxiety disorder: Secondary | ICD-10-CM | POA: Diagnosis not present

## 2023-04-15 DIAGNOSIS — I701 Atherosclerosis of renal artery: Secondary | ICD-10-CM | POA: Diagnosis not present

## 2023-04-15 DIAGNOSIS — I700 Atherosclerosis of aorta: Secondary | ICD-10-CM | POA: Diagnosis not present

## 2023-04-15 LAB — BASIC METABOLIC PANEL
Anion gap: 11 (ref 5–15)
BUN: 10 mg/dL (ref 8–23)
CO2: 27 mmol/L (ref 22–32)
Calcium: 8.7 mg/dL — ABNORMAL LOW (ref 8.9–10.3)
Chloride: 93 mmol/L — ABNORMAL LOW (ref 98–111)
Creatinine, Ser: 0.81 mg/dL (ref 0.44–1.00)
GFR, Estimated: >60 mL/min (ref >60)
Glucose, Bld: 143 mg/dL — ABNORMAL HIGH (ref 70–99)
Potassium: 3.3 mmol/L — ABNORMAL LOW (ref 3.5–5.1)
Sodium: 131 mmol/L — ABNORMAL LOW (ref 135–145)

## 2023-04-15 LAB — SURGICAL PATHOLOGY

## 2023-04-15 LAB — CBC
HCT: 31 % — ABNORMAL LOW (ref 36.0–46.0)
Hemoglobin: 10.2 g/dL — ABNORMAL LOW (ref 12.0–15.0)
MCH: 29.8 pg (ref 26.0–34.0)
MCHC: 32.9 g/dL (ref 30.0–36.0)
MCV: 90.6 fL (ref 80.0–100.0)
Platelets: 289 10*3/uL (ref 150–400)
RBC: 3.42 MIL/uL — ABNORMAL LOW (ref 3.87–5.11)
RDW: 13.8 % (ref 11.5–15.5)
WBC: 8.6 10*3/uL (ref 4.0–10.5)
nRBC: 0 % (ref 0.0–0.2)

## 2023-04-15 LAB — GLUCOSE, CAPILLARY
Glucose-Capillary: 166 mg/dL — ABNORMAL HIGH (ref 70–99)
Glucose-Capillary: 125 mg/dL — ABNORMAL HIGH (ref 70–99)

## 2023-04-15 MED ORDER — MEGESTROL ACETATE 400 MG/10ML PO SUSP: 400.0000 mg | Freq: Every day | ORAL | 0 refills | Status: DC

## 2023-04-15 MED ORDER — ASPIRIN 81 MG PO CHEW: 324.0000 mg | CHEWABLE_TABLET | Freq: Every day | ORAL | 0 refills | Status: AC

## 2023-04-15 MED ORDER — ALPRAZOLAM 0.25 MG PO TABS: 0.5000 mg | ORAL_TABLET | Freq: Every evening | ORAL | 0 refills | Status: DC | PRN

## 2023-04-15 MED ORDER — SERTRALINE HCL 50 MG PO TABS: 50.0000 mg | ORAL_TABLET | Freq: Every day | ORAL | 1 refills | Status: DC

## 2023-04-15 NOTE — Progress Notes (Signed)
Palliative: [name] is seen in her room.  She is preparing to discharge to [facility] nursing center for short-term rehab with ultimate goal of returning home.  Encouragement provided.    Face-to-face discussion with bedside nursing staff and transition of care team related to patient condition, needs.  Plan: Continue to treat the treatable but no CPR or intubation.  Short-term rehab at [facility] nursing center with the ultimate goal of returning home. DNR/goldenrod form completed and placed on chart.  25 minutes Lillia Carmel, NP Palliative medicine team Team phone [phone].

## 2023-04-15 NOTE — Plan of Care (Signed)

## 2023-04-15 NOTE — TOC Transition Note (Signed)
Transition of Care [facility]) - CM/SW Discharge Note   Patient Details  Name: [name] MRN: [id number] Date of Birth: [birth year]  Transition of Care [facility]) CM/SW Contact:  Elliot Gault, LCSW Phone Number: 04/15/2023, 10:11 AM   Clinical Narrative:     Pt medically stable for dc today per MD. Update Kerri at [facility] and they can admit pt today.  Updated pt/family and they remain in agreement with dc plan.  DC clinical sent electronically. RN to call report.  No other TOC needs for dc.  Final next level of care: Skilled Nursing Facility Barriers to Discharge: Barriers Resolved   Patient Goals and CMS Choice CMS Medicare.gov Compare Post Acute Care list provided to:: Patient Represenative (must comment) Choice offered to / list presented to : Adult Children  Discharge Placement                Patient chooses bed at: [facility] Patient to be transferred to facility by: w/c Name of family member notified: Barbara Cower Patient and family notified of of transfer: 04/15/23  Discharge Plan and Services Additional resources added to the After Visit Summary for   In-house Referral: Clinical Social Work   Post Acute Care Choice: Skilled Nursing Facility                               Social Determinants of Health (SDOH) Interventions SDOH Screenings   Food Insecurity: No Food Insecurity (04/08/2023)  Housing: Patient Declined (04/08/2023)  Transportation Needs: No Transportation Needs (04/08/2023)  Utilities: Not At Risk (04/08/2023)  Alcohol Screen: Low Risk  (09/06/2022)  Depression (PHQ2-9): Low Risk  (01/08/2023)  Financial Resource Strain: Low Risk  (09/06/2022)  Physical Activity: Sufficiently Active (09/06/2022)  Social Connections: Moderately Isolated (09/06/2022)  Stress: No Stress Concern Present (09/06/2022)  Tobacco Use: Low Risk  (04/08/2023)     Readmission Risk Interventions    04/08/2023    2:11 PM  Readmission Risk Prevention Plan  Transportation  Screening Complete  PCP or Specialist Appt within 3-5 Days Not Complete  HRI or Home Care Consult Complete  Social Work Consult for Recovery Care Planning/Counseling Complete  Palliative Care Screening Not Applicable  Medication Review Oceanographer) Complete

## 2023-04-15 NOTE — Discharge Summary (Signed)
Physician Discharge Summary   Patient: [name] MRN: [id number] DOB: 1932/06/30  Admit date:     04/07/2023  Discharge date: 04/15/23  Discharge Physician: Kendell Bane   PCP: Babs Sciara, MD   Recommendations at discharge:   Follow-up with the PCP in 1-2 weeks With a gastroenterologist in 2-4 weeks Continue current medications Continue PT/OT, fall precautions  Discharge Diagnoses: Principal Problem:   Mesenteric artery stenosis (HCC) Active Problems:   Abdominal pain, chronic, epigastric   Gastritis and gastroduodenitis  Resolved Problems:   * No resolved hospital problems. *  Hospital Course: [name] is a vibrant 89 y.o. 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 knee replacement in 2023 who was walking a mile a day at the end of last year.  Earlier this year she was down to half a mile a day.  In the last 3 months the patient has been plagued with intermittent diarrhea, poor appetite, and progressive weakness.  She appears to have hyponatremia that is improving as well as community-acquired pneumonia seen on chest x-ray.  She is also noted to have increasing weight loss and malaise that may be related to mesenteric ischemia.  GI asked to evaluate for this condition to see if IR intervention could be beneficial.  Palliative also consulted for further goals of care discussion.    During the hospitalization status post treatment for pneumonia, status post treatment for mesenteric stenosis with SMA stenting by IR.    Abdominal pain /mesenteric ischemia with high-grade SMA stenosis Due to mesenteric ischemia with high-grade stenosis to SMA as well as 50% stenosis of celiac artery -status post stenting   -Status post EGD 04/10/2023, no significant findings per GI -04/11/2023 s/p high-grade SMA stenosis on mesenteric angiogram, arterial mesenteric duplex s/p stenting by IR Dr. Marliss Coots     -Improved abdominal pain, advancing diet slowly today -GI following   -Status post repeat CTA, revealing. Approximately 50% stenosis at the origin of the celiac artery trunk. No significant stenosis involving the superior mesenteric artery. At least mild stenosis involving the origin of the inferior mesenteric artery. 2. Bilateral renal artery stenosis. Multiple left renal arteries as described.   -Discussed the case with GI and IR Dr. Elby Showers on 04/11/2023   -On PPI added ASA  -POA  postprandial Pain, burning sensation, 8 pound weight loss     Community-acquired pneumonia Improved shortness of breath, coughing, wheezing -On  IV Solu-Medrol -discontinued IV steroids -  breathing treatment DuoNeb bronchodilators -IV azithromycin/Rocephin switched to p.o. Levaquin--completed antibiotic course -Legionella neg   Hyponatremia-resolved Monitoring serum sodium -S/p  IVF    Recurrent diarrhea-, resolved -Monitoring BMs -On fiber supplementation and hold antimotility agents   Concern for failure to thrive -Appears to have poor long-term prognosis and does not appear to want aggressive measures -Appreciate palliative consultation -Adding Megace     Depression  -Very withdrawn, depressed -In scheduling Xanax      Code Status: DNR Family Communication: Discussed with her son at bedside Disposition Plan:   SNF- [facility] consulted   Consultants:  GI Palliative   Procedures:  CTA    Disposition: Skilled nursing facility Diet recommendation:  Discharge Diet Orders (From admission, onward)     Start     Ordered   04/15/23 0000  Diet - low sodium heart healthy        04/15/23 0655  Regular diet DISCHARGE MEDICATION: Allergies as of 04/15/2023       Reactions   Hydrocortisone Itching   Lisinopril    Other reaction(s): Lethargy (intolerance)   Phenergan [promethazine Hcl] Other (See Comments)   hallucinations   Statins Other (See Comments)    Severe myalgias   Trazodone And Nefazodone Cough   Prednisone Rash        Medication List     TAKE these medications    ALPRAZolam 0.25 MG tablet Commonly known as: XANAX Take 1 tablet (0.25 mg total) by mouth at bedtime as needed for sleep.   amLODipine 10 MG tablet Commonly known as: NORVASC Take 1 tablet (10 mg total) by mouth daily.   amoxicillin 500 MG capsule Commonly known as: AMOXIL Take 500 mg by mouth once as needed (prophylactic). 1-2 hours prior to dental appointment   Anti-Diarrheal 2 MG tablet Generic drug: loperamide TAKE 1 TABLET BY MOUTH AS NEEDED FOR DIARRHEA OR LOOSE STOOL**MAX 4 TABLETS IN 24 HOURS** What changed: See the new instructions.   aspirin 81 MG chewable tablet Chew 4 tablets (324 mg total) by mouth daily.   benzonatate 100 MG capsule Commonly known as: TESSALON TAKE (1) CAPSULE BY MOUTH 3 TIMES DAILY AS NEEDED FOR COUGH.   Beta Carotene Provitamin A 95621 units capsule Generic drug: beta carotene Take 25,000 Units by mouth daily.   bisacodyl 5 MG EC tablet Generic drug: bisacodyl TAKE 1 TABLET BY MOUTH ONCE DAILY AS NEEDED FOR MODERATE CONSTIPATION. What changed: See the new instructions.   Combivent Respimat 20-100 MCG/ACT Aers respimat Generic drug: Ipratropium-Albuterol INHALE (1) PUFF INTO THE LUNGS EVERY SIX HOURS AS NEEDED FOR WHEEZING OR SHORTNESS OF BREATH. What changed: See the new instructions.   cyanocobalamin 1000 MCG tablet Take 1,000 mcg by mouth at bedtime.   dicyclomine 10 MG capsule Commonly known as: BENTYL TAKE 1 CAPSULE BY MOUTH TWICE DAILY AS NEEDED FOR ABDOMINAL CRAMPS AND LOOSE STOOL. What changed:  how much to take how to take this when to take this reasons to take this   EasyMax Test test strip Generic drug: glucose blood CHECK BLOOD SUGAR ONCE DAILY.(CALL MD IF BS BELOW 60: OR IF BS ABOVE 400)   Fish Oil 1000 MG Caps One capsule BID What changed:  how much to take how to take this when to  take this additional instructions   gabapentin 100 MG capsule Commonly known as: NEURONTIN 2 po bid What changed:  how much to take how to take this when to take this additional instructions   GoodSense Hemorrhoidal 0.25-14-74.9 % rectal ointment Generic drug: phenylephrine-shark liver oil-mineral oil-petrolatum Place 1 Application rectally 3 (three) times daily as needed for irritation.   Iron (Ferrous Sulfate) 325 (65 Fe) MG Tabs Take 325 mg by mouth every other day.   iVIZIA Dry Eyes 0.5 % Soln Generic drug: Povidone (PF) Place 1 drop into both eyes in the morning, at noon, in the evening, and at bedtime.   loratadine 10 MG tablet Commonly known as: CLARITIN Take 10 mg by mouth daily.   losartan 100 MG tablet Commonly known as: COZAAR Take 1 tablet daily What changed:  how much to take how to take this when to take this additional instructions   meclizine 25 MG tablet Commonly known as: ANTIVERT Take 1 tablet (25 mg total) by mouth 3 (three) times daily as needed for dizziness or nausea.   melatonin 3 MG Tabs tablet Take 3 mg by mouth at bedtime.  metFORMIN 500 MG tablet Commonly known as: GLUCOPHAGE Take one tablet 500mg  po in the morning and 1/2 tablet 250 mg po at supper What changed:  how much to take how to take this when to take this   Pain Relief Extra Strength 500 MG tablet Generic drug: acetaminophen TAKE (2) TABLETS BY MOUTH EVERY SIX HOURS A NEEDED FOR PAIN. What changed: See the new instructions.   pantoprazole 40 MG tablet Commonly known as: PROTONIX Take 1 tablet (40 mg total) by mouth daily.   PreserVision AREDS 2 Caps Take 1 capsule by mouth in the morning and at bedtime.   prochlorperazine 10 MG tablet Commonly known as: COMPAZINE Take 1 tablet (10 mg total) by mouth every 6 (six) hours as needed for nausea or vomiting.   Restasis 0.05 % ophthalmic emulsion Generic drug: cycloSPORINE Place 1 drop into both eyes 2 (two) times  daily.   sertraline 25 MG tablet Commonly known as: ZOLOFT Take 25 mg by mouth daily.   Vitamin D (Cholecalciferol) 10 MCG (400 UNIT) Tabs TAKE 1 TABLET BY MOUTH ONCE DAILY.               Discharge Care Instructions  (From admission, onward)           Start     Ordered   04/15/23 0000  Discharge wound care:       Comments: Per RN Wound care   04/15/23 0655            Contact information for after-discharge care     Destination     [facility] Preferred SNF .   Service: Skilled Nursing Contact information: [address] [place] [address] [phone]                    Discharge Exam: Ceasar Mons Weights   04/08/23 9147 04/11/23 1800  Weight: 72.6 kg 74.1 kg        General:  AAO x 3,  cooperative, no distress;   HEENT:  Normocephalic, PERRL, otherwise with in Normal limits   Neuro:  CNII-XII intact. , normal motor and sensation, reflexes intact   Lungs:   Clear to auscultation BL, Respirations unlabored,  No wheezes / crackles  Cardio:    S1/S2, RRR, No murmure, No Rubs or Gallops   Abdomen:  Soft, non-tender, bowel sounds active all four quadrants, no guarding or peritoneal signs.  Muscular  skeletal:  Limited exam -global generalized weaknesses - in bed, able to move all 4 extremities,   2+ pulses,  symmetric, No pitting edema  Skin:  Dry, warm to touch, negative for any Rashes,  Wounds: Please see nursing documentation          Condition at discharge: good  The results of significant diagnostics from this hospitalization (including imaging, microbiology, ancillary and laboratory) are listed below for reference.   Imaging Studies: IR Angiogram Visceral Selective  Result Date: 04/11/2023 INDICATION: 89 year old female with extensive visceral ostial atherosclerotic disease and clinical presentation compatible with chronic mesenteric ischemia. EXAM: 1. Ultrasound-guided vascular access of the right  common femoral vein. 2. Abdominal aortogram. 3. Selective catheterization and angiography of the superior mesenteric artery. 4. Intravascular ultrasound. 5. Superior mesenteric artery stent placement. MEDICATIONS: 10 mg hydralazine, intravenous ANESTHESIA/SEDATION: Moderate (conscious) sedation was employed during this procedure. A total of Versed 2 mg and Fentanyl 100 mcg was administered intravenously. Moderate Sedation Time: 47 minutes. The patient's level of consciousness and vital signs were monitored continuously by  radiology nursing throughout the procedure under my direct supervision. CONTRAST:  80mL OMNIPAQUE IOHEXOL 300 MG/ML  SOLN FLUOROSCOPY: Radiation Exposure Index (as provided by the fluoroscopic device): 796 mGy Kerma COMPLICATIONS: None immediate. PROCEDURE: Informed consent was obtained from the patient following explanation of the procedure, risks, benefits and alternatives. The patient understands, agrees and consents for the procedure. All questions were addressed. A time out was performed prior to the initiation of the procedure. Maximal barrier sterile technique utilized including caps, mask, sterile gowns, sterile gloves, large sterile drape, hand hygiene, and Betadine prep. Preprocedure ultrasound evaluation of the right common femoral artery demonstrated patency. The procedure was planned. Subdermal Local anesthesia was administered with 1% lidocaine. A small skin nick was made. Under direct ultrasound visualization, the right common femoral artery is punctured with a 21 gauge micropuncture needle. Permanent ultrasound image was captured and stored in the record. A micropuncture sheath was inserted and limited right lower extremity angiogram was performed which demonstrated adequate puncture site for closure device use. A J wire was then inserted and directed under fluoroscopic guidance to the proximal abdominal aorta. Surgical dilation was performed ultimately allowing placement of a 7  French, 45 cm Tour guide sheath which was positioned in the proximal abdominal aorta. A pigtail catheter was then placed and abdominal aortogram was performed and frontal and lateral projections. Aortogram was significant for extensive circumferential atherosclerotic disease about the aorta and iliac vessels without flow-limiting stenosis. The celiac artery and superior mesenteric artery repeat into with severe ostial stenoses. Single bilateral renal arteries appeared severely stenosed at the ostia secondary to atherosclerotic plaque, however patent distally. The inferior mesenteric artery appeared patent. The superior mesenteric artery was then selected with a 4 French Navicross catheter and Glidewire. Dedicated superior mesenteric angiogram was performed which demonstrated distal patency. AV 18 wire was inserted into the distal main superior mesenteric artery. The catheter was removed and exchanged for an 018 IVUS catheter. Intravascular ultrasound was then performed through the proximal superior mesenteric artery which demonstrated severe, approximately 95% focal stenosis at the ostium secondary to calcified atherosclerotic plaque. There was mixed fibrofatty and calcific atherosclerotic plaque through approximately the proximal 2 cm of the superior mesenteric artery. Patent vessel sizing was then performed. The V 18 was then exchanged for a Rosen wire. Over the wire, a 7 mm x 29 mm VBX stent was inserted and placed under fluoroscopic guidance. Post deployment balloon molding of the ostial portion was performed up to 9 mm. Completion superior mesenteric angiogram demonstrated wide patency of the indwelling stent and distal vessels. The sheath was removed and exchanged for an 8 [place] Angio-Seal device. This was deployed successfully achieving immediate hemostasis. Peripheral pulses were unchanged. The patient tolerated the procedure well was transferred to the recovery area in good condition. IMPRESSION: 1. Severe  ostial stenosis of the celiac and superior mesenteric arteries. 2. Technically successful proximal superior mesenteric artery balloon expandable, covered stent placement. Marliss Coots, MD [facility] Radiology Electronically Signed   By: Marliss Coots M.D.   On: 04/11/2023 21:57   IR US Guide Vasc Access Right  Result Date: 04/11/2023 INDICATION: 89 year old female with extensive visceral ostial atherosclerotic disease and clinical presentation compatible with chronic mesenteric ischemia. EXAM: 1. Ultrasound-guided vascular access of the right common femoral vein. 2. Abdominal aortogram. 3. Selective catheterization and angiography of the superior mesenteric artery. 4. Intravascular ultrasound. 5. Superior mesenteric artery stent placement. MEDICATIONS: 10 mg hydralazine, intravenous ANESTHESIA/SEDATION: Moderate (conscious) sedation was employed during  this procedure. A total of Versed 2 mg and Fentanyl 100 mcg was administered intravenously. Moderate Sedation Time: 47 minutes. The patient's level of consciousness and vital signs were monitored continuously by radiology nursing throughout the procedure under my direct supervision. CONTRAST:  80mL OMNIPAQUE IOHEXOL 300 MG/ML  SOLN FLUOROSCOPY: Radiation Exposure Index (as provided by the fluoroscopic device): 796 mGy Kerma COMPLICATIONS: None immediate. PROCEDURE: Informed consent was obtained from the patient following explanation of the procedure, risks, benefits and alternatives. The patient understands, agrees and consents for the procedure. All questions were addressed. A time out was performed prior to the initiation of the procedure. Maximal barrier sterile technique utilized including caps, mask, sterile gowns, sterile gloves, large sterile drape, hand hygiene, and Betadine prep. Preprocedure ultrasound evaluation of the right common femoral artery demonstrated patency. The procedure was planned.  Subdermal Local anesthesia was administered with 1% lidocaine. A small skin nick was made. Under direct ultrasound visualization, the right common femoral artery is punctured with a 21 gauge micropuncture needle. Permanent ultrasound image was captured and stored in the record. A micropuncture sheath was inserted and limited right lower extremity angiogram was performed which demonstrated adequate puncture site for closure device use. A J wire was then inserted and directed under fluoroscopic guidance to the proximal abdominal aorta. Surgical dilation was performed ultimately allowing placement of a 7 French, 45 cm Tour guide sheath which was positioned in the proximal abdominal aorta. A pigtail catheter was then placed and abdominal aortogram was performed and frontal and lateral projections. Aortogram was significant for extensive circumferential atherosclerotic disease about the aorta and iliac vessels without flow-limiting stenosis. The celiac artery and superior mesenteric artery repeat into with severe ostial stenoses. Single bilateral renal arteries appeared severely stenosed at the ostia secondary to atherosclerotic plaque, however patent distally. The inferior mesenteric artery appeared patent. The superior mesenteric artery was then selected with a 4 French Navicross catheter and Glidewire. Dedicated superior mesenteric angiogram was performed which demonstrated distal patency. AV 18 wire was inserted into the distal main superior mesenteric artery. The catheter was removed and exchanged for an 018 IVUS catheter. Intravascular ultrasound was then performed through the proximal superior mesenteric artery which demonstrated severe, approximately 95% focal stenosis at the ostium secondary to calcified atherosclerotic plaque. There was mixed fibrofatty and calcific atherosclerotic plaque through approximately the proximal 2 cm of the superior mesenteric artery. Patent vessel sizing was then performed. The V 18  was then exchanged for a Rosen wire. Over the wire, a 7 mm x 29 mm VBX stent was inserted and placed under fluoroscopic guidance. Post deployment balloon molding of the ostial portion was performed up to 9 mm. Completion superior mesenteric angiogram demonstrated wide patency of the indwelling stent and distal vessels. The sheath was removed and exchanged for an 8 [place] Angio-Seal device. This was deployed successfully achieving immediate hemostasis. Peripheral pulses were unchanged. The patient tolerated the procedure well was transferred to the recovery area in good condition. IMPRESSION: 1. 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 2. Technically successful proximal superior mesenteric artery balloon expandable, covered stent placement. Marliss Coots, MD [facility] Radiology Electronically Signed   By: Marliss Coots M.D.   On: 04/11/2023 21:57   IR TRANSCATH PLC STENT 1ST ART NOT LE CV CAR VERT CAR  Result Date: 04/11/2023 INDICATION: 89 year old female with extensive visceral ostial atherosclerotic disease and clinical presentation compatible with chronic mesenteric ischemia. EXAM: 1. Ultrasound-guided vascular access of the  right common femoral vein. 2. Abdominal aortogram. 3. Selective catheterization and angiography of the superior mesenteric artery. 4. Intravascular ultrasound. 5. Superior mesenteric artery stent placement. MEDICATIONS: 10 mg hydralazine, intravenous ANESTHESIA/SEDATION: Moderate (conscious) sedation was employed during this procedure. A total of Versed 2 mg and Fentanyl 100 mcg was administered intravenously. Moderate Sedation Time: 47 minutes. The patient's level of consciousness and vital signs were monitored continuously by radiology nursing throughout the procedure under my direct supervision. CONTRAST:  80mL OMNIPAQUE IOHEXOL 300 MG/ML  SOLN FLUOROSCOPY: Radiation Exposure Index (as provided by the  fluoroscopic device): 796 mGy Kerma COMPLICATIONS: None immediate. PROCEDURE: Informed consent was obtained from the patient following explanation of the procedure, risks, benefits and alternatives. The patient understands, agrees and consents for the procedure. All questions were addressed. A time out was performed prior to the initiation of the procedure. Maximal barrier sterile technique utilized including caps, mask, sterile gowns, sterile gloves, large sterile drape, hand hygiene, and Betadine prep. Preprocedure ultrasound evaluation of the right common femoral artery demonstrated patency. The procedure was planned. Subdermal Local anesthesia was administered with 1% lidocaine. A small skin nick was made. Under direct ultrasound visualization, the right common femoral artery is punctured with a 21 gauge micropuncture needle. Permanent ultrasound image was captured and stored in the record. A micropuncture sheath was inserted and limited right lower extremity angiogram was performed which demonstrated adequate puncture site for closure device use. A J wire was then inserted and directed under fluoroscopic guidance to the proximal abdominal aorta. Surgical dilation was performed ultimately allowing placement of a 7 French, 45 cm Tour guide sheath which was positioned in the proximal abdominal aorta. A pigtail catheter was then placed and abdominal aortogram was performed and frontal and lateral projections. Aortogram was significant for extensive circumferential atherosclerotic disease about the aorta and iliac vessels without flow-limiting stenosis. The celiac artery and superior mesenteric artery repeat into with severe ostial stenoses. Single bilateral renal arteries appeared severely stenosed at the ostia secondary to atherosclerotic plaque, however patent distally. The inferior mesenteric artery appeared patent. The superior mesenteric artery was then selected with a 4 French Navicross catheter and  Glidewire. Dedicated superior mesenteric angiogram was performed which demonstrated distal patency. AV 18 wire was inserted into the distal main superior mesenteric artery. The catheter was removed and exchanged for an 018 IVUS catheter. Intravascular ultrasound was then performed through the proximal superior mesenteric artery which demonstrated severe, approximately 95% focal stenosis at the ostium secondary to calcified atherosclerotic plaque. There was mixed fibrofatty and calcific atherosclerotic plaque through approximately the proximal 2 cm of the superior mesenteric artery. Patent vessel sizing was then performed. The V 18 was then exchanged for a Rosen wire. Over the wire, a 7 mm x 29 mm VBX stent was inserted and placed under fluoroscopic guidance. Post deployment balloon molding of the ostial portion was performed up to 9 mm. Completion superior mesenteric angiogram demonstrated wide patency of the indwelling stent and distal vessels. The sheath was removed and exchanged for an 8 [place] Angio-Seal device. This was deployed successfully achieving immediate hemostasis. Peripheral pulses were unchanged. The patient tolerated the procedure well was transferred to the recovery area in good condition. IMPRESSION: 1. 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 2. Technically successful proximal superior mesenteric artery balloon expandable, covered stent placement. Marliss Coots, MD [facility] Radiology Electronically Signed   By: Marliss Coots M.D.   On: 04/11/2023 21:57   IR INTRAVASCULAR  ULTRASOUND NON CORONARY  Result Date: 04/11/2023 INDICATION: 89 year old female with extensive visceral ostial atherosclerotic disease and clinical presentation compatible with chronic mesenteric ischemia. EXAM: 1. Ultrasound-guided vascular access of the right common femoral vein. 2. Abdominal aortogram. 3. Selective catheterization and  angiography of the superior mesenteric artery. 4. Intravascular ultrasound. 5. Superior mesenteric artery stent placement. MEDICATIONS: 10 mg hydralazine, intravenous ANESTHESIA/SEDATION: Moderate (conscious) sedation was employed during this procedure. A total of Versed 2 mg and Fentanyl 100 mcg was administered intravenously. Moderate Sedation Time: 47 minutes. The patient's level of consciousness and vital signs were monitored continuously by radiology nursing throughout the procedure under my direct supervision. CONTRAST:  80mL OMNIPAQUE IOHEXOL 300 MG/ML  SOLN FLUOROSCOPY: Radiation Exposure Index (as provided by the fluoroscopic device): 796 mGy Kerma COMPLICATIONS: None immediate. PROCEDURE: Informed consent was obtained from the patient following explanation of the procedure, risks, benefits and alternatives. The patient understands, agrees and consents for the procedure. All questions were addressed. A time out was performed prior to the initiation of the procedure. Maximal barrier sterile technique utilized including caps, mask, sterile gowns, sterile gloves, large sterile drape, hand hygiene, and Betadine prep. Preprocedure ultrasound evaluation of the right common femoral artery demonstrated patency. The procedure was planned. Subdermal Local anesthesia was administered with 1% lidocaine. A small skin nick was made. Under direct ultrasound visualization, the right common femoral artery is punctured with a 21 gauge micropuncture needle. Permanent ultrasound image was captured and stored in the record. A micropuncture sheath was inserted and limited right lower extremity angiogram was performed which demonstrated adequate puncture site for closure device use. A J wire was then inserted and directed under fluoroscopic guidance to the proximal abdominal aorta. Surgical dilation was performed ultimately allowing placement of a 7 French, 45 cm Tour guide sheath which was positioned in the proximal abdominal  aorta. A pigtail catheter was then placed and abdominal aortogram was performed and frontal and lateral projections. Aortogram was significant for extensive circumferential atherosclerotic disease about the aorta and iliac vessels without flow-limiting stenosis. The celiac artery and superior mesenteric artery repeat into with severe ostial stenoses. Single bilateral renal arteries appeared severely stenosed at the ostia secondary to atherosclerotic plaque, however patent distally. The inferior mesenteric artery appeared patent. The superior mesenteric artery was then selected with a 4 French Navicross catheter and Glidewire. Dedicated superior mesenteric angiogram was performed which demonstrated distal patency. AV 18 wire was inserted into the distal main superior mesenteric artery. The catheter was removed and exchanged for an 018 IVUS catheter. Intravascular ultrasound was then performed through the proximal superior mesenteric artery which demonstrated severe, approximately 95% focal stenosis at the ostium secondary to calcified atherosclerotic plaque. There was mixed fibrofatty and calcific atherosclerotic plaque through approximately the proximal 2 cm of the superior mesenteric artery. Patent vessel sizing was then performed. The V 18 was then exchanged for a Rosen wire. Over the wire, a 7 mm x 29 mm VBX stent was inserted and placed under fluoroscopic guidance. Post deployment balloon molding of the ostial portion was performed up to 9 mm. Completion superior mesenteric angiogram demonstrated wide patency of the indwelling stent and distal vessels. The sheath was removed and exchanged for an 8 [place] Angio-Seal device. This was deployed successfully achieving immediate hemostasis. Peripheral pulses were unchanged. The patient tolerated the procedure well was transferred to the recovery area in good condition. IMPRESSION: 1. 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 2. Technically  successful proximal superior mesenteric artery balloon  expandable, covered stent placement. Marliss Coots, MD [facility] Electronically Signed   By: Marliss Coots M.D.   On: 04/11/2023 21:57   [place] MESENTERIC ARTERIES  Result Date: 04/11/2023 CLINICAL DATA:  Intestinal angina, weight loss and aortic and visceral atherosclerosis by CT angiography. EXAM: [place] MESENTERIC ARTERIAL DOPPLER COMPARISON:  CTA of the abdomen and pelvis on 04/10/2023 FINDINGS: Celiac axis: 318 proximal cm/sec, 237 centimeters/second distal. Spectral broadening of celiac waveforms. Celiac axis with inspiration: 252 cm/sec Celiac axis with expiration: 176 cm/sec Splenic artery: 61 cm/sec Hepatic artery: 113 cm/sec SMA: 306 proximal, 183 mid, 128 distal cm/sec. Spectral broadening of SMA waveforms. IMA: 247 cm/sec Aorta: 181 cm/sec Aortic size: 1.9 cm proximally, 1.4 cm in the mid segment and 1.3 cm distally IMPRESSION: Findings suggestive of probable hemodynamically significant stenoses of greater than 70% involving proximal origins of the celiac axis and superior mesenteric artery. Electronically Signed   By: Irish Lack M.D.   On: 04/11/2023 13:36   CT Angio Abd/Pel w/ and/or w/o  Result Date: 04/11/2023 CLINICAL DATA:  Mesenteric ischemia, acute. Follow-up stenosis on previous examination. History of abdominal pain and nausea. EXAM: CTA ABDOMEN AND PELVIS WITHOUT AND WITH CONTRAST TECHNIQUE: Multidetector CT imaging of the abdomen and pelvis was performed using the standard protocol during bolus administration of intravenous contrast. Multiplanar reconstructed images and MIPs were obtained and reviewed to evaluate the vascular anatomy. RADIATION DOSE REDUCTION: This exam was performed according to the departmental dose-optimization program which includes automated exposure control, adjustment of the mA and/or kV according to patient size and/or use of iterative  reconstruction technique. CONTRAST:  75mL OMNIPAQUE IOHEXOL 350 MG/ML SOLN COMPARISON:  03/07/2023 FINDINGS: VASCULAR Aorta: Atherosclerotic disease in the abdominal aorta without aneurysm, dissection or significant aortic stenosis. Celiac: Approximately 50% stenosis at the origin of the celiac artery trunk is similar to the previous examination. Main branch vessels are patent. SMA: Atherosclerotic disease involving the origin and proximal SMA without significant stenosis. Renals: Single right renal artery is patent with at least mild stenosis at the origin. Three left renal arteries. Moderate stenosis involving the origin of the superior left renal artery. No significant stenosis involving the middle left renal artery. There may be mild stenosis involving the inferior left renal artery. IMA: Patent with at least mild stenosis at the origin. Inflow: Common, internal and external iliac arteries are patent without significant stenosis. No evidence for aneurysm or dissection involving the iliac arteries. Proximal Outflow: Proximal femoral arteries are patent bilaterally. Veins: Portal venous system is patent. IVC and renal veins are patent. Iliac veins and proximal femoral veins are patent. Review of the MIP images confirms the above findings. NON-VASCULAR Lower chest: Bronchiectasis and atelectasis at the lung bases. Hepatobiliary: Stable 8 mm hypodensity in the right hepatic lobe on image 19/15 is likely an incidental finding. No suspicious hepatic lesions. No biliary dilatation. Gallbladder is distended with multiple stones. No inflammatory changes around the gallbladder. Pancreas: Unremarkable. No pancreatic ductal dilatation or surrounding inflammatory changes. Spleen: Normal in size without focal abnormality. Adrenals/Urinary Tract: Adrenal glands are within normal limits. Small hypodensities in both kidneys that likely represent small cysts and do not require dedicated follow-up. Urinary bladder is distended.  Stomach/Bowel: Normal appearance of the stomach. Normal appearance of the small bowel without dilatation. Normal appearance of the colon. No evidence for bowel wall thickening or inflammation. No evidence for an obstructive process. Lymphatic: No lymph node enlargement in the abdomen or pelvis. Reproductive: Status post  hysterectomy. No adnexal masses. Other: Negative for free fluid. Surgical mesh material along the anterior abdominal wall and anterior aspect of the pelvis. Musculoskeletal: No acute bone abnormality. IMPRESSION: VASCULAR 1. Main mesenteric arteries are patent. Approximately 50% stenosis at the origin of the celiac artery trunk. No significant stenosis involving the superior mesenteric artery. At least mild stenosis involving the origin of the inferior mesenteric artery. 2. Bilateral renal artery stenosis. Multiple left renal arteries as described. 3.  Aortic Atherosclerosis (ICD10-I70.0). NON-VASCULAR 1. No acute abnormality in the abdomen or pelvis. 2. Gallbladder is mildly distended with gallstones. No evidence for acute inflammation. 3. Distended urinary bladder without hydronephrosis. 4. Postoperative changes along the anterior abdominal wall with surgical mesh material. 5. Normal appearance of the bowel without evidence for obstruction or inflammatory changes. Electronically Signed   By: Richarda Overlie M.D.   On: 04/11/2023 07:31   DG CHEST PORT 1 VIEW  Result Date: 04/10/2023 CLINICAL DATA:  Cough and weakness. EXAM: PORTABLE CHEST 1 VIEW COMPARISON:  Chest radiographs 04/07/2023, 04/06/2023, 03/16/2023; CT chest 02/25/2021 FINDINGS: Cardiac silhouette is again moderately enlarged. Mediastinal contours are within normal limits. Mild calcification within aortic arch. Mild left-greater-than-right horizontal linear basilar densities appear slightly decreased on the right and unchanged on the left from most recent 04/07/2023 radiographs. Improved aeration of the right lung base. No definite  pleural effusion. No pneumothorax. No acute skeletal abnormality. IMPRESSION: Improved aeration of the right lung base. Bibasilar horizontal linear likely scarring. It is again difficult to exclude mild left basilar retrocardiac airspace opacification, possible aspiration versus pneumonia as on recent 04/07/2023 radiograph. Electronically Signed   By: Neita Garnet M.D.   On: 04/10/2023 13:58   DG Chest Portable 1 View  Result Date: 04/07/2023 CLINICAL DATA:  Shortness of breath EXAM: PORTABLE CHEST 1 VIEW COMPARISON:  Chest x-ray dated April 06, 2023 FINDINGS: Patient rotation to the right somewhat limits evaluation. Cardiac and mediastinal contours are within normal limits when accounting for patient rotation. Increased right greater than left basilar opacities. No large pleural effusion or evidence of pneumothorax. IMPRESSION: Increased right greater than left basilar opacities, concerning for infection or aspiration. Recommend follow-up PA and lateral chest radiograph in 6-8 weeks to ensure resolution. Electronically Signed   By: Allegra Lai M.D.   On: 04/07/2023 15:55   DG Chest Portable 1 View  Result Date: 04/06/2023 CLINICAL DATA:  Cough.  COVID positive. EXAM: PORTABLE CHEST 1 VIEW COMPARISON:  03/16/2023 FINDINGS: AP portable radiograph with mild apical lordotic positioning. Numerous leads and wires project over the chest. Midline trachea. Mild cardiomegaly. Atherosclerosis in the transverse aorta. No pleural effusion or pneumothorax. Medial right lung base opacity is similar and likely due to scarring. There may be increased density along the left heart border and lateral left hemidiaphragm. No congestive failure. IMPRESSION: Possible increased density along the left heart border and lateral left hemidiaphragm. Cannot exclude left base airspace disease. Consider further evaluation with PA and lateral radiographs. Cardiomegaly without congestive failure. Aortic Atherosclerosis (ICD10-I70.0).  Electronically Signed   By: Jeronimo Greaves M.D.   On: 04/06/2023 10:01   DG Chest Portable 1 View  Result Date: 03/16/2023 CLINICAL DATA:  Pt reported urinary frequency and not fully emptying bladder, with abdominal pressure. Recently seen for UTI and given antibiotics. EXAM: PORTABLE CHEST 1 VIEW COMPARISON:  03/07/2023 and older studies.  CT, 02/25/2021. FINDINGS: Cardiac silhouette mildly enlarged.  No mediastinal or hilar masses. Lungs are hyperexpanded. Mild linear atelectasis or scarring noted in the right  lung base. No lung consolidation or edema. No convincing pleural effusion and no pneumothorax. Skeletal structures are demineralized, grossly intact. IMPRESSION: No acute cardiopulmonary disease. Electronically Signed   By: Amie Portland M.D.   On: 03/16/2023 11:56    Microbiology: Results for orders placed or performed during the hospital encounter of 04/07/23  SARS Coronavirus 2 by RT PCR (hospital order, performed in [facility] hospital lab) *cepheid single result test*     Status: None   Collection Time: 04/07/23  9:09 PM  Result Value Ref Range Status   SARS Coronavirus 2 by RT PCR NEGATIVE NEGATIVE Final    Comment: (NOTE) SARS-CoV-2 target nucleic acids are NOT DETECTED.  The SARS-CoV-2 RNA is generally detectable in upper and lower respiratory specimens during the acute phase of infection. The lowest concentration of SARS-CoV-2 viral copies this assay can detect is 250 copies / mL. A negative result does not preclude SARS-CoV-2 infection and should not be used as the sole basis for treatment or other patient management decisions.  A negative result may occur with improper specimen collection / handling, submission of specimen other than nasopharyngeal swab, presence of viral mutation(s) within the areas targeted by this assay, and inadequate number of viral copies (<250 copies / mL). A negative result must be combined with clinical observations, patient history, and  epidemiological information.  Fact Sheet for Patients:   RoadLapTop.co.za  Fact Sheet for Healthcare Providers: http://kim-miller.com/  This test is not yet approved or  cleared by the [place] FDA and has been authorized for detection and/or diagnosis of SARS-CoV-2 by FDA under an Emergency Use Authorization (EUA).  This EUA will remain in effect (meaning this test can be used) for the duration of the COVID-19 declaration under Section 564(b)(1) of the Act, 21 U.S.C. section 360bbb-3(b)(1), unless the authorization is terminated or revoked sooner.  Performed at [facility], [address] [place], [place] [address]   MRSA Next Gen by PCR, Nasal     Status: None   Collection Time: 04/08/23  7:00 AM   Specimen: Nasal Mucosa; Nasal Swab  Result Value Ref Range Status   MRSA by PCR Next Gen NOT DETECTED NOT DETECTED Final    Comment: (NOTE) The GeneXpert MRSA Assay (FDA approved for NASAL specimens only), is one component of a comprehensive MRSA colonization surveillance program. It is not intended to diagnose MRSA infection nor to guide or monitor treatment for MRSA infections. Test performance is not FDA approved in patients less than 9 years old. Performed at [facility], [address] [place], [place] [address]   MRSA Next Gen by PCR, Nasal     Status: None   Collection Time: 04/11/23  5:57 PM   Specimen: Nasal Mucosa; Nasal Swab  Result Value Ref Range Status   MRSA by PCR Next Gen NOT DETECTED NOT DETECTED Final    Comment: (NOTE) The GeneXpert MRSA Assay (FDA approved for NASAL specimens only), is one component of a comprehensive MRSA colonization surveillance program. It is not intended to diagnose MRSA infection nor to guide or monitor treatment for MRSA infections. Test performance is not FDA approved in patients less than 28 years old. Performed at [facility], [address], [place], [place] [address]      Labs: CBC: Recent Labs  Lab 04/11/23 0432 04/12/23 0535 04/13/23 0422 04/14/23 0457 04/15/23 0404  WBC 8.6 6.1 6.9 7.1 8.6  HGB 10.5* 10.7* 9.7* 9.9* 10.2*  HCT 32.6* 34.0* 29.7* 29.8* 31.0*  MCV 91.1  92.1 91.1 89.5 90.6  PLT 277 237 285 259 289   Basic Metabolic Panel: Recent Labs  Lab 04/09/23 0437 04/11/23 0432 04/12/23 0535 04/13/23 0422 04/14/23 0457 04/15/23 0404  NA 133* 132* 137 133* 131* 131*  K 3.5 3.7 4.4 3.6 3.6 3.3*  CL 94* 94* 98 97* 95* 93*  CO2 29 29 29 26 29 27   GLUCOSE 128* 128* 146* 136* 165* 143*  BUN 16 15 14 13 10 10   CREATININE 0.80 0.79 0.74 0.65 0.72 0.81  CALCIUM 9.0 8.9 9.0 8.5* 8.5* 8.7*  MG 1.7  --   --   --   --   --    Liver Function Tests: Recent Labs  Lab 04/09/23 0437  AST 13*  ALT 14  ALKPHOS 58  BILITOT 0.8  PROT 6.0*  ALBUMIN 3.1*   CBG: Recent Labs  Lab 04/13/23 2144 04/14/23 0753 04/14/23 1223 04/14/23 1626 04/14/23 1950  GLUCAP 166* 176* 149* 152* 151*    Discharge time spent: greater than 30 minutes.  Signed: Kendell Bane, MD [facility] 04/15/2023

## 2023-04-15 NOTE — Progress Notes (Signed)
Mobility Specialist Progress Note:    04/15/23 0930  Mobility  Activity Ambulated with assistance in hallway;Ambulated with assistance to bathroom  Level of Assistance Minimal assist, patient does 75% or more  Assistive Device Front wheel walker  Distance Ambulated (ft) 200 ft  Range of Motion/Exercises Active;All extremities  Activity Response Tolerated well  Mobility Referral Yes  $Mobility charge 1 Mobility  Mobility Specialist Start Time (ACUTE ONLY) 0930  Mobility Specialist Stop Time (ACUTE ONLY) 0945  Mobility Specialist Time Calculation (min) (ACUTE ONLY) 15 min   Pt received in chair, agreeable to mobility. Required MinA to stand and CGA to ambulate with RW. Tolerated well, c/o fatigue. Returned pt to room, left in chair. All needs met.   Lawerance Bach Mobility Specialist Please contact via Special educational needs teacher or  Rehab office at [phone]

## 2023-04-16 ENCOUNTER — Non-Acute Institutional Stay (SKILLED_NURSING_FACILITY): Payer: Self-pay | Admitting: Adult Health

## 2023-04-16 ENCOUNTER — Encounter: Payer: Self-pay | Admitting: Adult Health

## 2023-04-16 DIAGNOSIS — I152 Hypertension secondary to endocrine disorders: Secondary | ICD-10-CM

## 2023-04-16 DIAGNOSIS — E1159 Type 2 diabetes mellitus with other circulatory complications: Secondary | ICD-10-CM | POA: Diagnosis not present

## 2023-04-16 DIAGNOSIS — K551 Chronic vascular disorders of intestine: Secondary | ICD-10-CM | POA: Diagnosis not present

## 2023-04-16 DIAGNOSIS — I700 Atherosclerosis of aorta: Secondary | ICD-10-CM | POA: Diagnosis not present

## 2023-04-16 DIAGNOSIS — E871 Hypo-osmolality and hyponatremia: Secondary | ICD-10-CM | POA: Diagnosis not present

## 2023-04-16 DIAGNOSIS — K2990 Gastroduodenitis, unspecified, without bleeding: Secondary | ICD-10-CM | POA: Diagnosis not present

## 2023-04-16 DIAGNOSIS — E1142 Type 2 diabetes mellitus with diabetic polyneuropathy: Secondary | ICD-10-CM

## 2023-04-16 DIAGNOSIS — J3089 Other allergic rhinitis: Secondary | ICD-10-CM | POA: Diagnosis not present

## 2023-04-16 DIAGNOSIS — F419 Anxiety disorder, unspecified: Secondary | ICD-10-CM | POA: Diagnosis not present

## 2023-04-16 DIAGNOSIS — D62 Acute posthemorrhagic anemia: Secondary | ICD-10-CM

## 2023-04-16 DIAGNOSIS — K2970 Gastritis, unspecified, without bleeding: Secondary | ICD-10-CM

## 2023-04-16 NOTE — Progress Notes (Signed)
Location:  [name] Room Number: [id number] Place of Service:  SNF (31)   CODE STATUS: dnr   Allergies  Allergen Reactions   Hydrocortisone Itching   Lisinopril     Other reaction(s): Lethargy (intolerance)   Phenergan [Promethazine Hcl] Other (See Comments)    hallucinations   Statins Other (See Comments)    Severe myalgias   Trazodone And Nefazodone Cough   Prednisone Rash    Chief Complaint  Patient presents with   Hospitalization Follow-up    HPI:  She is a 89 year old woman who has been hospitalized from 04-07-23 through 04-15-23. Her medical history includes status post hemicolectomy due to rupture; hypertension; diabetes mellitus. She had been walking a mile daily at the end of last year. She has dropped to 1/2 mile daily. In the past 3 months she has had intermittent diarrhea; poor appetite; and progressive weakness. She was treated for hyponatremia; as well as pneumonia. 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  She is here for short term rehab with her goal to return back home. She will continue to be followed for her chronic illnesses including:  Type 2 diabetes mellitus with atherosclerosis of aorta:      Hypertension associated with type 2 diabetes mellitus:   Aortic atherosclerosis      Past Medical History:  Diagnosis Date   Blood transfusion without reported diagnosis    Cognitive dysfunction 03/04/2019   Patient scores 21 out of 30 on [place] cognitive assessment September 2020   Diabetes mellitus without complication (HCC)    Diabetic peripheral neuropathy associated with type 2 diabetes mellitus (HCC) 02/25/2022   Diverticulitis    Frailty 03/04/2019   H/O bilateral breast reduction surgery    Hyperlipidemia    a. intolerant to statins.    Hypertension     Past Surgical History:  Procedure Laterality Date   ABDOMINAL HYSTERECTOMY  2003   APPENDECTOMY     age 39   BREAST REDUCTION SURGERY     age 25    COLON SURGERY Left 2011   Hemicolectomy due to diverticulitis   EYE SURGERY  03/30/2009   cataract   IR ANGIOGRAM VISCERAL SELECTIVE  04/11/2023   IR INTRAVASCULAR ULTRASOUND NON CORONARY  04/11/2023   IR TRANSCATH PLC STENT 1ST ART NOT LE CV CAR VERT CAR  04/11/2023   IR US GUIDE VASC ACCESS RIGHT  04/11/2023   KNEE SURGERY Right 2003   knee cap   LAPAROSCOPIC INCISIONAL / UMBILICAL / VENTRAL HERNIA REPAIR  02/23/2007   REDUCTION MAMMAPLASTY Bilateral 2001   REFRACTIVE SURGERY     TOTAL KNEE ARTHROPLASTY Right 04/10/2022   Procedure: TOTAL KNEE ARTHROPLASTY;  Surgeon: Eugenia Mcalpine, MD;  Location: [facility];  Service: Orthopedics;  Laterality: Right;  adductor canal    Social History   Socioeconomic History   Marital status: Widowed    Spouse name: Not on file   Number of children: Not on file   Years of education: Not on file   Highest education level: Not on file  Occupational History   Not on file  Tobacco Use   Smoking status: Never   Smokeless tobacco: Never  Vaping Use   Vaping status: Never Used  Substance and Sexual Activity   Alcohol use: No   Drug use: No   Sexual activity: Never    Birth control/protection: Abstinence  Other Topics Concern   Not on file  Social History Narrative  Not on file   Social Determinants of Health   Financial Resource Strain: Low Risk  (09/06/2022)   Overall Financial Resource Strain (CARDIA)    Difficulty of Paying Living Expenses: Not hard at all  Food Insecurity: No Food Insecurity (04/08/2023)   Hunger Vital Sign    Worried About Running Out of Food in the Last Year: Never true    Ran Out of Food in the Last Year: Never true  Transportation Needs: No Transportation Needs (04/08/2023)   PRAPARE - Administrator, Civil Service (Medical): No    Lack of Transportation (Non-Medical): No  Physical Activity: Sufficiently Active (09/06/2022)   Exercise Vital Sign    Days of Exercise per Week: 5 days    Minutes of  Exercise per Session: 30 min  Stress: No Stress Concern Present (09/06/2022)   [employer] of Occupational Health - Occupational Stress Questionnaire    Feeling of Stress : Not at all  Social Connections: Moderately Isolated (09/06/2022)   Social Connection and Isolation Panel [NHANES]    Frequency of Communication with Friends and Family: More than three times a week    Frequency of Social Gatherings with Friends and Family: More than three times a week    Attends Religious Services: 1 to 4 times per year    Active Member of [employer] or Organizations: No    Attends Banker Meetings: Never    Marital Status: Widowed  Intimate Partner Violence: Not At Risk (04/08/2023)   Humiliation, Afraid, Rape, and Kick questionnaire    Fear of Current or Ex-Partner: No    Emotionally Abused: No    Physically Abused: No    Sexually Abused: No   Family History  Problem Relation Age of Onset   Cancer Mother        ovaian   Hypertension Father        kidney   Stroke Father    Cancer Father    Hypertension Sister    Diabetes Brother       VITAL SIGNS BP (!) 151/60   Pulse 74   Temp 98.1 F (36.7 C)   Resp 20   Ht 5\' 8"  (1.727 m)   Wt 161 lb 12.8 oz (73.4 kg)   SpO2 99%   BMI 24.60 kg/m   Outpatient Encounter Medications as of 04/16/2023  Medication Sig   acetaminophen (PAIN RELIEF EXTRA STRENGTH) 500 MG tablet TAKE (2) TABLETS BY MOUTH EVERY SIX HOURS A NEEDED FOR PAIN. (Patient taking differently: Take 1,000 mg by mouth every 6 (six) hours as needed for moderate pain.)   ALPRAZolam (XANAX) 0.25 MG tablet Take 2 tablets (0.5 mg total) by mouth at bedtime as needed for up to 10 days for sleep.   amLODipine (NORVASC) 10 MG tablet Take 1 tablet (10 mg total) by mouth daily.   amoxicillin (AMOXIL) 500 MG capsule Take 500 mg by mouth once as needed (prophylactic). 1-2 hours prior to dental appointment   aspirin 81 MG chewable tablet Chew 4 tablets (324 mg total) by mouth daily.    benzonatate (TESSALON) 100 MG capsule TAKE (1) CAPSULE BY MOUTH 3 TIMES DAILY AS NEEDED FOR COUGH.   BETA CAROTENE PROVITAMIN A 45409 units capsule Take 25,000 Units by mouth daily.   BISACODYL 5 MG EC tablet TAKE 1 TABLET BY MOUTH ONCE DAILY AS NEEDED FOR MODERATE CONSTIPATION. (Patient taking differently: Take 5 mg by mouth daily as needed for moderate constipation.)   COMBIVENT RESPIMAT 20-100 MCG/ACT  AERS respimat INHALE (1) PUFF INTO THE LUNGS EVERY SIX HOURS AS NEEDED FOR WHEEZING OR SHORTNESS OF BREATH. (Patient taking differently: Inhale 1 puff into the lungs every 6 (six) hours as needed for wheezing or shortness of breath.)   cyanocobalamin 1000 MCG tablet Take 1,000 mcg by mouth at bedtime.   dicyclomine (BENTYL) 10 MG capsule TAKE 1 CAPSULE BY MOUTH TWICE DAILY AS NEEDED FOR ABDOMINAL CRAMPS AND LOOSE STOOL. (Patient taking differently: Take 10 mg by mouth 2 (two) times daily as needed (abdominal cramps and loose stools.). TAKE 1 CAPSULE BY MOUTH TWICE DAILY AS NEEDED FOR ABDOMINAL CRAMPS AND LOOSE STOOL.)   EASYMAX TEST test strip CHECK BLOOD SUGAR ONCE DAILY.(CALL MD IF BS BELOW 60: OR IF BS ABOVE 400)   gabapentin (NEURONTIN) 100 MG capsule 2 po bid (Patient taking differently: Take 200 mg by mouth 2 (two) times daily.)   GOODSENSE HEMORRHOIDAL 0.25-14-74.9 % rectal ointment Place 1 Application rectally 3 (three) times daily as needed for irritation.   Iron, Ferrous Sulfate, 325 (65 Fe) MG TABS Take 325 mg by mouth every other day.   IVIZIA DRY EYES 0.5 % SOLN Place 1 drop into both eyes in the morning, at noon, in the evening, and at bedtime.   loperamide (ANTI-DIARRHEAL) 2 MG tablet TAKE 1 TABLET BY MOUTH AS NEEDED FOR DIARRHEA OR LOOSE STOOL**MAX 4 TABLETS IN 24 HOURS** (Patient taking differently: Take 2 mg by mouth 4 (four) times daily as needed for diarrhea or loose stools.)   losartan (COZAAR) 100 MG tablet Take 1 tablet daily (Patient taking differently: Take 100 mg by mouth  daily.)   megestrol (MEGACE) 400 MG/10ML suspension Take 10 mLs (400 mg total) by mouth daily.   melatonin 3 MG TABS tablet Take 3 mg by mouth at bedtime.   metFORMIN (GLUCOPHAGE) 500 MG tablet Take one tablet 500mg  po in the morning and 1/2 tablet 250 mg po at supper (Patient taking differently: Take 750 mg by mouth daily. Take one tablet 500mg  po in the morning and 1/2 tablet 250 mg po at supper)   Multiple Vitamins-Minerals (PRESERVISION AREDS 2) CAPS Take 1 capsule by mouth in the morning and at bedtime.   Omega-3 Fatty Acids (FISH OIL) 1000 MG CAPS One capsule BID (Patient taking differently: Take 1 capsule by mouth in the morning and at bedtime.)   pantoprazole (PROTONIX) 40 MG tablet Take 1 tablet (40 mg total) by mouth daily.   RESTASIS 0.05 % ophthalmic emulsion Place 1 drop into both eyes 2 (two) times daily.   sertraline (ZOLOFT) 50 MG tablet Take 1 tablet (50 mg total) by mouth daily.   Vitamin D, Cholecalciferol, 10 MCG (400 UNIT) TABS TAKE 1 TABLET BY MOUTH ONCE DAILY.   No facility-administered encounter medications on file as of 04/16/2023.     SIGNIFICANT DIAGNOSTIC EXAMS  TODAY  04-07-23: wbc 7.2; hgb 11.7; hct 35.8; mcv 88.8 plt 245; glucose 107; bun 16; creat 0.71; k+ 3.9; na++ 126; ca 8.6; gfr >60; protein 6.3; albumin 3.5;  04-08-23: tsh 1.161 04-15-23: wbc 8.6; hgb 10.2; hct 31.0; mcv 90.6 plt 289; glucose 143; bun 10; creat 0.81; k+ 3.3; na++ 131; ca 8.7; gfr >60   Review of Systems  Constitutional:  Negative for malaise/fatigue.  Respiratory:  Negative for cough and shortness of breath.   Cardiovascular:  Negative for chest pain, palpitations and leg swelling.  Gastrointestinal:  Negative for abdominal pain, constipation and heartburn.  Musculoskeletal:  Negative for back pain, joint pain  and myalgias.  Skin: Negative.   Neurological:  Negative for dizziness.  Psychiatric/Behavioral:  The patient is not nervous/anxious.    Physical Exam Constitutional:       General: She is not in acute distress.    Appearance: She is well-developed. She is not diaphoretic.  Neck:     Thyroid: No thyromegaly.  Cardiovascular:     Rate and Rhythm: Normal rate and regular rhythm.     Pulses: Normal pulses.     Heart sounds: Normal heart sounds.  Pulmonary:     Effort: Pulmonary effort is normal. No respiratory distress.     Breath sounds: Normal breath sounds.  Abdominal:     General: Bowel sounds are normal. There is no distension.     Palpations: Abdomen is soft.     Tenderness: There is no abdominal tenderness.  Musculoskeletal:        General: Normal range of motion.     Cervical back: Neck supple.     Right lower leg: No edema.     Left lower leg: No edema.  Lymphadenopathy:     Cervical: No cervical adenopathy.  Skin:    General: Skin is warm and dry.  Neurological:     Mental Status: She is alert. Mental status is at baseline.  Psychiatric:        Mood and Affect: Mood normal.       ASSESSMENT/ PLAN:  TODAY  Mesenteric artery stenosis'/ gastritis and duodenitis: s/p stenting; will continue protonix 40 mg daily to monitor her status   2. Type 2 diabetes mellitus with atherosclerosis of aorta: will continue metformin 500 mg in the AM and 250 mg in the PM.   3. Hypertension associated with type 2 diabetes mellitus: b/p 151/60: will continue norvasc 10 mg daily; cozaar 100 mg daily asa 81 mg #4 tabs daily   4. Aortic atherosclerosis (ct 04-10-23) is on asa   5. Chronic non-seasonal allergic rhinitis: will continue claritin 10 mg daily   6. Diabetic neuropathy associated with type 2 diabetes mellitus: will continue gabapentin 200 mg twice daily   7. Acute blood loss anemia: is on iron every other day; hgb 8.6  8. Chronic anxiety: will continue xanax 0.25 mg nightly as needed; zoloft 50 mg daily   6.   Synthia Innocent NP [facility]  call [phone]

## 2023-04-16 NOTE — Progress Notes (Signed)
I reviewed the pathology results. Ann, can you send her a letter with the findings as described below please? Thanks,  Vista Lawman, MD Gastroenterology and Hepatology [facility]  ---------------------------------------------------------------------------------------------  [facility] [address], [place], [place] [address] Phone:  [phone]   04/16/23 [place], [place]   Dear [name],  I am writing to inform you that the biopsies taken during your recent endoscopic examination showed:  No H. Pylori bacteria in stomach   Please call us at [phone] if you have persistent problems or have questions about your condition that have not been fully answered at this time.  Sincerely,  Vista Lawman, MD Gastroenterology and Hepatology

## 2023-04-17 ENCOUNTER — Encounter: Payer: Self-pay | Admitting: Internal Medicine

## 2023-04-17 ENCOUNTER — Non-Acute Institutional Stay (SKILLED_NURSING_FACILITY): Payer: Medicare Other | Admitting: Internal Medicine

## 2023-04-17 DIAGNOSIS — K551 Chronic vascular disorders of intestine: Secondary | ICD-10-CM

## 2023-04-17 DIAGNOSIS — E1159 Type 2 diabetes mellitus with other circulatory complications: Secondary | ICD-10-CM

## 2023-04-17 DIAGNOSIS — R531 Weakness: Secondary | ICD-10-CM | POA: Diagnosis not present

## 2023-04-17 DIAGNOSIS — D62 Acute posthemorrhagic anemia: Secondary | ICD-10-CM | POA: Insufficient documentation

## 2023-04-17 DIAGNOSIS — I700 Atherosclerosis of aorta: Secondary | ICD-10-CM

## 2023-04-17 DIAGNOSIS — E871 Hypo-osmolality and hyponatremia: Secondary | ICD-10-CM

## 2023-04-17 NOTE — Assessment & Plan Note (Addendum)
While hospitalized 10/7 - 04/15/2023 sodium ranged from a nadir value of 126 @ admission to a peak of 137.  Final sodium was 131.  Continue to monitor. R/O  SIADH if hyponatremia is progressive.

## 2023-04-17 NOTE — Assessment & Plan Note (Addendum)
10/7 - 04/15/2023 glucoses ranged from a low 112 up to high of 220; both outliers.  Current A1c was 6.4% on 9/9 indicating excellent control.  No change indicated.  Continue to monitor glucoses. No anginal equivalent voiced.

## 2023-04-17 NOTE — Assessment & Plan Note (Signed)
Her postprandial abdominal pain has improved but she still expresses fear of significant oral intake.  Nutritionist to consult at SNF.

## 2023-04-17 NOTE — Patient Instructions (Signed)
See assessment and plan under each diagnosis in the problem list and acutely for this visit

## 2023-04-17 NOTE — Assessment & Plan Note (Signed)
Adult failure to thrive syndrome is multifactorial.  Limb atrophy and interosseous wasting is present.  Nutritionist to consult at SNF.  PT/OT as tolerated.

## 2023-04-17 NOTE — Progress Notes (Signed)
NURSING HOME LOCATION:  [facility] ROOM NUMBER:  129 P  CODE STATUS:  DNR  PCP:  Lilyan Punt MD  This is a comprehensive admission note to this SNFperformed on this date less than 30 days from date of admission. Included are preadmission medical/surgical history; reconciled medication list; family history; social history and comprehensive review of systems.  Corrections and additions to the records were documented. Comprehensive physical exam was also performed. Additionally a clinical summary was entered for each active diagnosis pertinent to this admission in the Problem List to enhance continuity of care.  HPI: She was hospitalized 10/7 - 04/15/2023 presenting with anorexia, intermittent diarrhea, and progressive weakness.  These were associated with weight loss of 8 pounds and malaise.  She stated that 30-45 minutes after eating she would be "totally out of commission" due to "burning and throbbing pain."  Clinically mesenteric ischemia was suspected.  GI consulted; EGD 10/10 revealed no significant findings.  On 10/11 mesenteric angiogram revealed high-grade SMA stenosis.  Bilateral renal artery stenosis was also present.  IR performed SMA stenting for mesenteric stenosis.  Following the procedure the patient stated that there had been improvement in the post prandial pain ; but she still was reluctant to increase her nutritional intake. PPI was continued and ASA added for vascular prophylaxis. While hospitalized glucoses ranged from a low of 112 up to high of 220.  The most current A1c was 6.4% on 03/10/2023.   Hyponatremia was present with a low of 126 and a peak of 137.  Final sodium was 131.   Initial H/H was 12.4/36.6; nadir values were 9.7/29.7 and final values 10.2/31.0. During hospitalization therapy for community-acquired pneumonia was completed.  IV azithromycin and Rocephin were transitioned to p.o. Levaquin.  She also received IV Solu-Medrol and DuoNeb  bronchodilators. PT/OT consulted and recommended SNF placement for adult failure to thrive syndrome.  Past medical and surgical history: Includes diabetes with vascular complications and peripheral neuropathy, history of diverticulosis/diverticulitis, essential hypertension, and dyslipidemia. Surgeries and procedures include abdominal hysterectomy, breast reduction surgery, hemicolectomy for diverticulitis, and total knee arthroplasty.  Family history: reviewed, non contributory due to advanced age.  Social history: Nondrinker, non-smoker.   Review of systems: She states that the diarrhea has resolved but she is now constipated and this has not responded to Dulcolax.  She is on iron supplementation.   She is aware that they performed a stent "of 3 blood vessels which went to my stomach."  She validates that "1-1/2 of the vessels were stopped up."   In reference to the community-acquired pneumonia her cough and mucus production have improved.   She validates significant anxiety. She is concerned that her eyedrops for Macular Degeneration are not being administered as per schedule PTA.  Constitutional: No fever, chills, sweats Eyes: No redness, discharge, pain ENT/mouth: No nasal congestion, purulent discharge, earache, change in hearing, sore throat  Cardiovascular: No chest pain, palpitations, paroxysmal nocturnal dyspnea, claudication, edema  Respiratory: No  hemoptysis, DOE, significant snoring, apnea Gastrointestinal: No heartburn, dysphagia, nausea /vomiting, rectal bleeding, melena Genitourinary: No dysuria, hematuria, pyuria, incontinence, nocturia Musculoskeletal: No joint stiffness, joint swelling, weakness, pain Dermatologic: No rash, pruritus, change in appearance of skin Neurologic: No dizziness, headache, syncope, seizures, numbness, tingling Psychiatric: No significant depression, insomnia Endocrine: No change in hair/skin/nails, excessive thirst, excessive hunger, excessive  urination  Hematologic/lymphatic: No significant bruising, lymphadenopathy, abnormal bleeding Allergy/immunology: No itchy/watery eyes, significant sneezing, urticaria, angioedema  Physical exam:  Pertinent or positive findings: She  appears her age.  She has bilateral ptosis.  The left eye tends to squint.  Dental hygiene is fair. She has low-grade expiratory rhonchi.  She has a nonproductive cough.  Rhythm is irregular.  Pedal pulses are decreased.  Limbs are atrophic and there is slight interosseous wasting.  General appearance: no acute distress, increased work of breathing is present.   Lymphatic: No lymphadenopathy about the head, neck, axilla. Eyes: No conjunctival inflammation or lid edema is present. There is no scleral icterus. Ears:  External ear exam shows no significant lesions or deformities.   Nose:  External nasal examination shows no deformity or inflammation. Nasal mucosa are pink and moist without lesions, exudates Neck:  No thyromegaly, masses, tenderness noted.    Heart:  No gallop, murmur, click, rub.  Lungs:  without wheezes, rales, rubs. Abdomen: Bowel sounds are normal.  Abdomen is soft and nontender with no organomegaly, hernias, masses. GU: Deferred  Extremities:  No cyanosis, clubbing, edema. Neurologic exam: Balance, Rhomberg, finger to nose testing could not be completed due to clinical state Skin: Warm & dry w/o tenting. No significant lesions or rash.  See clinical summary under each active problem in the Problem List with associated updated therapeutic plan

## 2023-04-18 NOTE — Anesthesia Postprocedure Evaluation (Signed)
Anesthesia Post Note  Patient: [name]  Procedure(s) Performed: ESOPHAGOGASTRODUODENOSCOPY (EGD) WITH PROPOFOL BIOPSY  Patient location during evaluation: Phase II Anesthesia Type: General Level of consciousness: awake Pain management: pain level controlled Vital Signs Assessment: post-procedure vital signs reviewed and stable Respiratory status: spontaneous breathing and respiratory function stable Cardiovascular status: blood pressure returned to baseline and stable Postop Assessment: no headache and no apparent nausea or vomiting Anesthetic complications: no Comments: Late entry   No notable events documented.   Last Vitals:  Vitals:   04/14/23 1948 04/15/23 0429  BP: (!) 151/60 (!) 155/60  Pulse: (!) 122 69  Resp: 16 16  Temp: 36.6 C 36.7 C  SpO2: 94% 97%    Last Pain:  Vitals:   04/15/23 0910  TempSrc:   PainSc: 0-No pain                 Windell Norfolk

## 2023-04-21 ENCOUNTER — Non-Acute Institutional Stay (SKILLED_NURSING_FACILITY): Payer: Self-pay | Admitting: Adult Health

## 2023-04-21 ENCOUNTER — Ambulatory Visit: Payer: Medicare Other | Admitting: Urology

## 2023-04-21 ENCOUNTER — Other Ambulatory Visit (HOSPITAL_COMMUNITY)
Admission: RE | Admit: 2023-04-21 | Discharge: 2023-04-21 | Disposition: A | Discharge status: Home / Self Care | Payer: Medicare Other | Source: Skilled Nursing Facility | Attending: Adult Health | Admitting: Adult Health

## 2023-04-21 ENCOUNTER — Ambulatory Visit: Payer: Medicare Other | Admitting: Family Medicine

## 2023-04-21 ENCOUNTER — Encounter: Payer: Self-pay | Admitting: Adult Health

## 2023-04-21 DIAGNOSIS — E43 Unspecified severe protein-calorie malnutrition: Secondary | ICD-10-CM | POA: Diagnosis not present

## 2023-04-21 DIAGNOSIS — D62 Acute posthemorrhagic anemia: Secondary | ICD-10-CM

## 2023-04-21 DIAGNOSIS — I152 Hypertension secondary to endocrine disorders: Secondary | ICD-10-CM | POA: Insufficient documentation

## 2023-04-21 DIAGNOSIS — E871 Hypo-osmolality and hyponatremia: Secondary | ICD-10-CM

## 2023-04-21 LAB — COMPREHENSIVE METABOLIC PANEL
ALT: 12 U/L (ref 0–44)
AST: 11 U/L — ABNORMAL LOW (ref 15–41)
Albumin: 2.7 g/dL — ABNORMAL LOW (ref 3.5–5.0)
Alkaline Phosphatase: 50 U/L (ref 38–126)
Anion gap: 10 (ref 5–15)
BUN: 17 mg/dL (ref 8–23)
CO2: 23 mmol/L (ref 22–32)
Calcium: 8.2 mg/dL — ABNORMAL LOW (ref 8.9–10.3)
Chloride: 96 mmol/L — ABNORMAL LOW (ref 98–111)
Creatinine, Ser: 0.76 mg/dL (ref 0.44–1.00)
GFR, Estimated: >60 mL/min (ref >60)
Glucose, Bld: 87 mg/dL (ref 70–99)
Potassium: 3.7 mmol/L (ref 3.5–5.1)
Sodium: 129 mmol/L — ABNORMAL LOW (ref 135–145)
Total Bilirubin: 0.5 mg/dL (ref 0.3–1.2)
Total Protein: 5.7 g/dL — ABNORMAL LOW (ref 6.5–8.1)

## 2023-04-21 LAB — HEMOGLOBIN AND HEMATOCRIT, BLOOD
HCT: 25.8 % — ABNORMAL LOW (ref 36.0–46.0)
Hemoglobin: 8.4 g/dL — ABNORMAL LOW (ref 12.0–15.0)

## 2023-04-21 NOTE — Progress Notes (Unsigned)
Location:  [name] Room Number: 129 Place of Service:  SNF (31)   CODE STATUS: dnr   Allergies  Allergen Reactions   Hydrocortisone Itching   Lisinopril     Other reaction(s): Lethargy (intolerance)   Phenergan [Promethazine Hcl] Other (See Comments)    hallucinations   Statins Other (See Comments)    Severe myalgias   Trazodone And Nefazodone Cough   Prednisone Rash    Chief Complaint  Patient presents with   Acute Visit    Follow up labs     HPI:  She has had routine follow up labs. Her albumin is 2.7; hgb 8.4; and na++ 129. Her weight is without significant change. She does drink 6-8 glasses of water daily. She is taking #4 81 mg asa tabs. There are no reports of bleeding present; no change in stools.   Past Medical History:  Diagnosis Date   Blood transfusion without reported diagnosis    Cognitive dysfunction 03/04/2019   Patient scores 21 out of 30 on [place] cognitive assessment September 2020   Diabetes mellitus without complication (HCC)    Diabetic peripheral neuropathy associated with type 2 diabetes mellitus (HCC) 02/25/2022   Diverticulitis    Frailty 03/04/2019   H/O bilateral breast reduction surgery    Hyperlipidemia    a. intolerant to statins.    Hypertension     Past Surgical History:  Procedure Laterality Date   ABDOMINAL HYSTERECTOMY  2003   APPENDECTOMY     age 21   BIOPSY  04/10/2023   Procedure: BIOPSY;  Surgeon: Franky Macho, MD;  Location: [facility];  Service: Endoscopy;;   BREAST REDUCTION SURGERY     age 40   COLON SURGERY Left 2011   Hemicolectomy due to diverticulitis   ESOPHAGOGASTRODUODENOSCOPY (EGD) WITH PROPOFOL N/A 04/10/2023   Procedure: ESOPHAGOGASTRODUODENOSCOPY (EGD) WITH PROPOFOL;  Surgeon: Franky Macho, MD;  Location: [facility];  Service: Endoscopy;  Laterality: N/A;   EYE SURGERY  03/30/2009   cataract   IR ANGIOGRAM VISCERAL SELECTIVE  04/11/2023   IR INTRAVASCULAR  ULTRASOUND NON CORONARY  04/11/2023   IR TRANSCATH PLC STENT 1ST ART NOT LE CV CAR VERT CAR  04/11/2023   IR US GUIDE VASC ACCESS RIGHT  04/11/2023   KNEE SURGERY Right 2003   knee cap   LAPAROSCOPIC INCISIONAL / UMBILICAL / VENTRAL HERNIA REPAIR  02/23/2007   REDUCTION MAMMAPLASTY Bilateral 2001   REFRACTIVE SURGERY     TOTAL KNEE ARTHROPLASTY Right 04/10/2022   Procedure: TOTAL KNEE ARTHROPLASTY;  Surgeon: Eugenia Mcalpine, MD;  Location: [facility];  Service: Orthopedics;  Laterality: Right;  adductor canal    Social History   Socioeconomic History   Marital status: Widowed    Spouse name: Not on file   Number of children: Not on file   Years of education: Not on file   Highest education level: Not on file  Occupational History   Not on file  Tobacco Use   Smoking status: Never   Smokeless tobacco: Never  Vaping Use   Vaping status: Never Used  Substance and Sexual Activity   Alcohol use: No   Drug use: No   Sexual activity: Never    Birth control/protection: Abstinence  Other Topics Concern   Not on file  Social History Narrative   Not on file   Social Determinants of Health   Financial Resource Strain: Low Risk  (09/06/2022)   Overall Financial Resource Strain (CARDIA)  Difficulty of Paying Living Expenses: Not hard at all  Food Insecurity: No Food Insecurity (04/08/2023)   Hunger Vital Sign    Worried About Running Out of Food in the Last Year: Never true    Ran Out of Food in the Last Year: Never true  Transportation Needs: No Transportation Needs (04/08/2023)   PRAPARE - Administrator, Civil Service (Medical): No    Lack of Transportation (Non-Medical): No  Physical Activity: Sufficiently Active (09/06/2022)   Exercise Vital Sign    Days of Exercise per Week: 5 days    Minutes of Exercise per Session: 30 min  Stress: No Stress Concern Present (09/06/2022)   [employer] of Occupational Health - Occupational Stress Questionnaire    Feeling of  Stress : Not at all  Social Connections: Moderately Isolated (09/06/2022)   Social Connection and Isolation Panel [NHANES]    Frequency of Communication with Friends and Family: More than three times a week    Frequency of Social Gatherings with Friends and Family: More than three times a week    Attends Religious Services: 1 to 4 times per year    Active Member of [employer] or Organizations: No    Attends Banker Meetings: Never    Marital Status: Widowed  Intimate Partner Violence: Not At Risk (04/08/2023)   Humiliation, Afraid, Rape, and Kick questionnaire    Fear of Current or Ex-Partner: No    Emotionally Abused: No    Physically Abused: No    Sexually Abused: No   Family History  Problem Relation Age of Onset   Cancer Mother        ovaian   Hypertension Father        kidney   Stroke Father    Cancer Father    Hypertension Sister    Diabetes Brother       VITAL SIGNS BP (!) 129/59   Pulse 64   Temp 98.4 F (36.9 C)   Resp (!) 22   Ht 5\' 8"  (1.727 m)   Wt 162 lb 9.6 oz (73.8 kg)   SpO2 98%   BMI 24.72 kg/m   Outpatient Encounter Medications as of 04/21/2023  Medication Sig   acetaminophen (TYLENOL) 325 MG tablet Take 650 mg by mouth every 6 (six) hours as needed.   ALPRAZolam (XANAX) 0.25 MG tablet Take 0.25 mg by mouth at bedtime as needed for anxiety.   amLODipine (NORVASC) 10 MG tablet Take 1 tablet (10 mg total) by mouth daily.   amoxicillin (AMOXIL) 500 MG capsule Take 500 mg by mouth once as needed (prophylactic). 1-2 hours prior to dental appointment   aspirin 81 MG chewable tablet Chew 4 tablets (324 mg total) by mouth daily.   benzonatate (TESSALON) 100 MG capsule TAKE (1) CAPSULE BY MOUTH 3 TIMES DAILY AS NEEDED FOR COUGH.   BETA CAROTENE PROVITAMIN A 16109 units capsule Take 25,000 Units by mouth daily.   BISACODYL 5 MG EC tablet TAKE 1 TABLET BY MOUTH ONCE DAILY AS NEEDED FOR MODERATE CONSTIPATION. (Patient taking differently: Take 5 mg by  mouth daily as needed for moderate constipation.)   COMBIVENT RESPIMAT 20-100 MCG/ACT AERS respimat INHALE (1) PUFF INTO THE LUNGS EVERY SIX HOURS AS NEEDED FOR WHEEZING OR SHORTNESS OF BREATH. (Patient taking differently: Inhale 1 puff into the lungs every 6 (six) hours as needed.)   cyanocobalamin 1000 MCG tablet Take 1,000 mcg by mouth at bedtime.   dicyclomine (BENTYL) 10 MG capsule TAKE 1  CAPSULE BY MOUTH TWICE DAILY AS NEEDED FOR ABDOMINAL CRAMPS AND LOOSE STOOL. (Patient taking differently: Take 10 mg by mouth 2 (two) times daily as needed (abdominal cramps and loose stools.). TAKE 1 CAPSULE BY MOUTH TWICE DAILY AS NEEDED FOR ABDOMINAL CRAMPS AND LOOSE STOOL.)   EASYMAX TEST test strip CHECK BLOOD SUGAR ONCE DAILY.(CALL MD IF BS BELOW 60: OR IF BS ABOVE 400)   gabapentin (NEURONTIN) 100 MG capsule 2 po bid (Patient taking differently: Take 200 mg by mouth 2 (two) times daily.)   GOODSENSE HEMORRHOIDAL 0.25-14-74.9 % rectal ointment Place 1 Application rectally 3 (three) times daily as needed for irritation.   Iron, Ferrous Sulfate, 325 (65 Fe) MG TABS Take 325 mg by mouth every other day.   IVIZIA DRY EYES 0.5 % SOLN Place 1 drop into both eyes in the morning, at noon, in the evening, and at bedtime.   loperamide (ANTI-DIARRHEAL) 2 MG tablet TAKE 1 TABLET BY MOUTH AS NEEDED FOR DIARRHEA OR LOOSE STOOL**MAX 4 TABLETS IN 24 HOURS** (Patient taking differently: Take 2 mg by mouth 4 (four) times daily as needed for diarrhea or loose stools.)   losartan (COZAAR) 100 MG tablet Take 1 tablet daily (Patient taking differently: Take 100 mg by mouth daily.)   melatonin 3 MG TABS tablet Take 3 mg by mouth at bedtime.   metFORMIN (GLUCOPHAGE) 500 MG tablet Take one tablet 500mg  po in the morning and 1/2 tablet 250 mg po at supper (Patient taking differently: Take 750 mg by mouth daily. Take one tablet 500mg  po in the morning and 1/2 tablet 250 mg po at supper)   Multiple Vitamins-Minerals (PRESERVISION AREDS  2) CAPS Take 1 capsule by mouth in the morning and at bedtime.   Omega-3 Fatty Acids (FISH OIL) 1000 MG CAPS One capsule BID (Patient taking differently: Take 1 capsule by mouth in the morning and at bedtime.)   pantoprazole (PROTONIX) 40 MG tablet Take 1 tablet (40 mg total) by mouth daily.   RESTASIS 0.05 % ophthalmic emulsion Place 1 drop into both eyes 2 (two) times daily.   sertraline (ZOLOFT) 50 MG tablet Take 1 tablet (50 mg total) by mouth daily.   Vitamin D, Cholecalciferol, 10 MCG (400 UNIT) TABS TAKE 1 TABLET BY MOUTH ONCE DAILY.   No facility-administered encounter medications on file as of 04/21/2023.     SIGNIFICANT DIAGNOSTIC EXAMS  PREVIOUS   04-07-23: wbc 7.2; hgb 11.7; hct 35.8; mcv 88.8 plt 245; glucose 107; bun 16; creat 0.71; k+ 3.9; na++ 126; ca 8.6; gfr >60; protein 6.3; albumin 3.5;  04-08-23: tsh 1.161 04-15-23: wbc 8.6; hgb 10.2; hct 31.0; mcv 90.6 plt 289; glucose 143; bun 10; creat 0.81; k+ 3.3; na++ 131; ca 8.7; gfr >60   TODAY  04-21-23: hgb 8.4; hct 25.8; glucose 87; bun 17; creat 0.76; k+ 3.7; na++ 129; ca 8.2 gfr >60; protein 5.7; albumin 2.7   Review of Systems  Constitutional:  Negative for malaise/fatigue.  Respiratory:  Negative for cough and shortness of breath.   Cardiovascular:  Negative for chest pain, palpitations and leg swelling.  Gastrointestinal:  Negative for abdominal pain, constipation and heartburn.  Musculoskeletal:  Negative for back pain, joint pain and myalgias.  Skin: Negative.   Neurological:  Negative for dizziness.  Psychiatric/Behavioral:  The patient is not nervous/anxious.     Physical Exam Constitutional:      General: She is not in acute distress.    Appearance: She is well-developed. She is not diaphoretic.  Neck:     Thyroid: No thyromegaly.  Cardiovascular:     Rate and Rhythm: Normal rate and regular rhythm.     Pulses: Normal pulses.     Heart sounds: Normal heart sounds.  Pulmonary:     Effort: Pulmonary  effort is normal. No respiratory distress.     Breath sounds: Normal breath sounds.  Abdominal:     General: Bowel sounds are normal. There is no distension.     Palpations: Abdomen is soft.     Tenderness: There is no abdominal tenderness.  Musculoskeletal:        General: Normal range of motion.     Cervical back: Neck supple.     Right lower leg: No edema.     Left lower leg: No edema.  Lymphadenopathy:     Cervical: No cervical adenopathy.  Skin:    General: Skin is warm and dry.  Neurological:     Mental Status: She is alert and oriented to person, place, and time.     Comments: 04-18-23: bcat 44/50  Psychiatric:        Mood and Affect: Mood normal.       ASSESSMENT/ PLAN:  TODAY  Acute blood loss anemia Severe protein calorie malnutrition Hyponatremia  Will begin prosource 30 mL three times daily Will hemoccult stool Will lower asa to 81 mg daily Will begin NACL 1 gm daily  Will repeat hgb/hct bmp    Synthia Innocent NP [facility]  call [phone]

## 2023-04-22 ENCOUNTER — Encounter (INDEPENDENT_AMBULATORY_CARE_PROVIDER_SITE_OTHER): Payer: Self-pay | Admitting: *Deleted

## 2023-04-22 ENCOUNTER — Other Ambulatory Visit: Payer: Self-pay | Admitting: Interventional Radiology

## 2023-04-22 DIAGNOSIS — K551 Chronic vascular disorders of intestine: Secondary | ICD-10-CM

## 2023-04-24 ENCOUNTER — Other Ambulatory Visit: Payer: Self-pay | Admitting: Adult Health

## 2023-04-24 MED ORDER — ALPRAZOLAM 0.25 MG PO TABS: 0.2500 mg | ORAL_TABLET | Freq: Every evening | ORAL | 0 refills | Status: DC | PRN

## 2023-04-28 ENCOUNTER — Other Ambulatory Visit (HOSPITAL_COMMUNITY)
Admission: RE | Admit: 2023-04-28 | Discharge: 2023-04-28 | Disposition: A | Discharge status: Home / Self Care | Payer: Medicare Other | Source: Skilled Nursing Facility | Attending: Adult Health | Admitting: Adult Health

## 2023-04-28 DIAGNOSIS — K55059 Acute (reversible) ischemia of intestine, part and extent unspecified: Secondary | ICD-10-CM | POA: Insufficient documentation

## 2023-04-28 LAB — BASIC METABOLIC PANEL
Anion gap: 9 (ref 5–15)
BUN: 16 mg/dL (ref 8–23)
CO2: 24 mmol/L (ref 22–32)
Calcium: 8.7 mg/dL — ABNORMAL LOW (ref 8.9–10.3)
Chloride: 100 mmol/L (ref 98–111)
Creatinine, Ser: 0.71 mg/dL (ref 0.44–1.00)
GFR, Estimated: >60 mL/min (ref >60)
Glucose, Bld: 115 mg/dL — ABNORMAL HIGH (ref 70–99)
Potassium: 4.1 mmol/L (ref 3.5–5.1)
Sodium: 133 mmol/L — ABNORMAL LOW (ref 135–145)

## 2023-04-28 LAB — HEMOGLOBIN AND HEMATOCRIT, BLOOD
HCT: 29.7 % — ABNORMAL LOW (ref 36.0–46.0)
Hemoglobin: 9.6 g/dL — ABNORMAL LOW (ref 12.0–15.0)

## 2023-04-30 ENCOUNTER — Encounter: Payer: Self-pay | Admitting: Adult Health

## 2023-04-30 ENCOUNTER — Non-Acute Institutional Stay (SKILLED_NURSING_FACILITY): Payer: Medicare Other | Admitting: Adult Health

## 2023-04-30 DIAGNOSIS — E43 Unspecified severe protein-calorie malnutrition: Secondary | ICD-10-CM

## 2023-04-30 DIAGNOSIS — I700 Atherosclerosis of aorta: Secondary | ICD-10-CM

## 2023-04-30 DIAGNOSIS — K551 Chronic vascular disorders of intestine: Secondary | ICD-10-CM

## 2023-04-30 DIAGNOSIS — E1169 Type 2 diabetes mellitus with other specified complication: Secondary | ICD-10-CM | POA: Diagnosis not present

## 2023-04-30 NOTE — Progress Notes (Unsigned)
Location:  [name] Room Number: 129 Place of Service:  SNF (31)   CODE STATUS: dnr   Allergies  Allergen Reactions   Hydrocortisone Itching   Lisinopril     Other reaction(s): Lethargy (intolerance)   Phenergan [Promethazine Hcl] Other (See Comments)    hallucinations   Statins Other (See Comments)    Severe myalgias   Trazodone And Nefazodone Cough   Prednisone Rash    Chief Complaint  Patient presents with   Discharge Note    HPI:  She is being discharged to assisted living. She will need physical therapy. She will need to follow up with her medical provider. She will not need any dme. She had been hospitalized for mesenteric artery stenosis. She was admitted to this facility for short term rehab.  She has participated in pt and ot: ambulate 300 feet with walker with mod assist. Upper body is independent; lower body with mod I; transfers mod I; brp: mod I.   Past Medical History:  Diagnosis Date   Blood transfusion without reported diagnosis    Cognitive dysfunction 03/04/2019   Patient scores 21 out of 30 on [place] cognitive assessment September 2020   Diabetes mellitus without complication (HCC)    Diabetic peripheral neuropathy associated with type 2 diabetes mellitus (HCC) 02/25/2022   Diverticulitis    Frailty 03/04/2019   H/O bilateral breast reduction surgery    Hyperlipidemia    a. intolerant to statins.    Hypertension     Past Surgical History:  Procedure Laterality Date   ABDOMINAL HYSTERECTOMY  2003   APPENDECTOMY     age 39   BIOPSY  04/10/2023   Procedure: BIOPSY;  Surgeon: Franky Macho, MD;  Location: [facility];  Service: Endoscopy;;   BREAST REDUCTION SURGERY     age 50   COLON SURGERY Left 2011   Hemicolectomy due to diverticulitis   ESOPHAGOGASTRODUODENOSCOPY (EGD) WITH PROPOFOL N/A 04/10/2023   Procedure: ESOPHAGOGASTRODUODENOSCOPY (EGD) WITH PROPOFOL;  Surgeon: Franky Macho, MD;  Location: [facility];  Service: Endoscopy;  Laterality: N/A;   EYE SURGERY  03/30/2009   cataract   IR ANGIOGRAM VISCERAL SELECTIVE  04/11/2023   IR INTRAVASCULAR ULTRASOUND NON CORONARY  04/11/2023   IR TRANSCATH PLC STENT 1ST ART NOT LE CV CAR VERT CAR  04/11/2023   IR US GUIDE VASC ACCESS RIGHT  04/11/2023   KNEE SURGERY Right 2003   knee cap   LAPAROSCOPIC INCISIONAL / UMBILICAL / VENTRAL HERNIA REPAIR  02/23/2007   REDUCTION MAMMAPLASTY Bilateral 2001   REFRACTIVE SURGERY     TOTAL KNEE ARTHROPLASTY Right 04/10/2022   Procedure: TOTAL KNEE ARTHROPLASTY;  Surgeon: Eugenia Mcalpine, MD;  Location: [facility];  Service: Orthopedics;  Laterality: Right;  adductor canal    Social History   Socioeconomic History   Marital status: Widowed    Spouse name: Not on file   Number of children: Not on file   Years of education: Not on file   Highest education level: Not on file  Occupational History   Not on file  Tobacco Use   Smoking status: Never   Smokeless tobacco: Never  Vaping Use   Vaping status: Never Used  Substance and Sexual Activity   Alcohol use: No   Drug use: No   Sexual activity: Never    Birth control/protection: Abstinence  Other Topics Concern   Not on file  Social History Narrative   Not on file  Social Determinants of Health   Financial Resource Strain: Low Risk  (09/06/2022)   Overall Financial Resource Strain (CARDIA)    Difficulty of Paying Living Expenses: Not hard at all  Food Insecurity: No Food Insecurity (04/08/2023)   Hunger Vital Sign    Worried About Running Out of Food in the Last Year: Never true    Ran Out of Food in the Last Year: Never true  Transportation Needs: No Transportation Needs (04/08/2023)   PRAPARE - Administrator, Civil Service (Medical): No    Lack of Transportation (Non-Medical): No  Physical Activity: Sufficiently Active (09/06/2022)   Exercise Vital Sign    Days of Exercise per Week: 5 days    Minutes of Exercise per  Session: 30 min  Stress: No Stress Concern Present (09/06/2022)   [employer] of Occupational Health - Occupational Stress Questionnaire    Feeling of Stress : Not at all  Social Connections: Moderately Isolated (09/06/2022)   Social Connection and Isolation Panel [NHANES]    Frequency of Communication with Friends and Family: More than three times a week    Frequency of Social Gatherings with Friends and Family: More than three times a week    Attends Religious Services: 1 to 4 times per year    Active Member of [employer] or Organizations: No    Attends Banker Meetings: Never    Marital Status: Widowed  Intimate Partner Violence: Not At Risk (04/08/2023)   Humiliation, Afraid, Rape, and Kick questionnaire    Fear of Current or Ex-Partner: No    Emotionally Abused: No    Physically Abused: No    Sexually Abused: No   Family History  Problem Relation Age of Onset   Cancer Mother        ovaian   Hypertension Father        kidney   Stroke Father    Cancer Father    Hypertension Sister    Diabetes Brother       VITAL SIGNS BP 138/88   Pulse 67   Temp (!) 97.5 F (36.4 C)   Resp 20   Ht 5\' 8"  (1.727 m)   Wt 161 lb 12.8 oz (73.4 kg)   SpO2 98%   BMI 24.60 kg/m   Outpatient Encounter Medications as of 04/30/2023  Medication Sig   acetaminophen (TYLENOL) 325 MG tablet Take 650 mg by mouth every 6 (six) hours as needed.   ALPRAZolam (XANAX) 0.25 MG tablet Take 1 tablet (0.25 mg total) by mouth at bedtime as needed for anxiety.   amLODipine (NORVASC) 10 MG tablet Take 1 tablet (10 mg total) by mouth daily.   amoxicillin (AMOXIL) 500 MG capsule Take 500 mg by mouth once as needed (prophylactic). 1-2 hours prior to dental appointment   aspirin 81 MG chewable tablet Chew 4 tablets (324 mg total) by mouth daily. (Patient taking differently: Chew 81 mg by mouth daily.)   benzonatate (TESSALON) 100 MG capsule TAKE (1) CAPSULE BY MOUTH 3 TIMES DAILY AS NEEDED FOR COUGH.    BETA CAROTENE PROVITAMIN A 32440 units capsule Take 25,000 Units by mouth daily.   BISACODYL 5 MG EC tablet TAKE 1 TABLET BY MOUTH ONCE DAILY AS NEEDED FOR MODERATE CONSTIPATION. (Patient taking differently: Take 5 mg by mouth daily as needed for moderate constipation.)   COMBIVENT RESPIMAT 20-100 MCG/ACT AERS respimat INHALE (1) PUFF INTO THE LUNGS EVERY SIX HOURS AS NEEDED FOR WHEEZING OR SHORTNESS OF BREATH. (Patient taking differently:  Inhale 1 puff into the lungs every 6 (six) hours as needed.)   cyanocobalamin 1000 MCG tablet Take 1,000 mcg by mouth at bedtime.   dicyclomine (BENTYL) 10 MG capsule TAKE 1 CAPSULE BY MOUTH TWICE DAILY AS NEEDED FOR ABDOMINAL CRAMPS AND LOOSE STOOL. (Patient taking differently: Take 10 mg by mouth 2 (two) times daily as needed (abdominal cramps and loose stools.). TAKE 1 CAPSULE BY MOUTH TWICE DAILY AS NEEDED FOR ABDOMINAL CRAMPS AND LOOSE STOOL.)   EASYMAX TEST test strip CHECK BLOOD SUGAR ONCE DAILY.(CALL MD IF BS BELOW 60: OR IF BS ABOVE 400)   gabapentin (NEURONTIN) 100 MG capsule 2 po bid (Patient taking differently: Take 200 mg by mouth 2 (two) times daily.)   GOODSENSE HEMORRHOIDAL 0.25-14-74.9 % rectal ointment Place 1 Application rectally 3 (three) times daily as needed for irritation.   Iron, Ferrous Sulfate, 325 (65 Fe) MG TABS Take 325 mg by mouth every other day.   IVIZIA DRY EYES 0.5 % SOLN Place 1 drop into both eyes in the morning, at noon, in the evening, and at bedtime.   loperamide (ANTI-DIARRHEAL) 2 MG tablet TAKE 1 TABLET BY MOUTH AS NEEDED FOR DIARRHEA OR LOOSE STOOL**MAX 4 TABLETS IN 24 HOURS** (Patient taking differently: Take 2 mg by mouth 4 (four) times daily as needed for diarrhea or loose stools.)   losartan (COZAAR) 100 MG tablet Take 1 tablet daily (Patient taking differently: Take 100 mg by mouth daily.)   melatonin 3 MG TABS tablet Take 3 mg by mouth at bedtime.   metFORMIN (GLUCOPHAGE) 500 MG tablet Take one tablet 500mg  po in the  morning and 1/2 tablet 250 mg po at supper (Patient taking differently: Take 750 mg by mouth daily. Take one tablet 500mg  po in the morning and 1/2 tablet 250 mg po at supper)   Multiple Vitamins-Minerals (PRESERVISION AREDS 2) CAPS Take 1 capsule by mouth in the morning and at bedtime.   Omega-3 Fatty Acids (FISH OIL) 1000 MG CAPS One capsule BID (Patient taking differently: Take 1 capsule by mouth in the morning and at bedtime.)   pantoprazole (PROTONIX) 40 MG tablet Take 1 tablet (40 mg total) by mouth daily.   RESTASIS 0.05 % ophthalmic emulsion Place 1 drop into both eyes 2 (two) times daily.   sertraline (ZOLOFT) 50 MG tablet Take 1 tablet (50 mg total) by mouth daily.   Vitamin D, Cholecalciferol, 10 MCG (400 UNIT) TABS TAKE 1 TABLET BY MOUTH ONCE DAILY.   No facility-administered encounter medications on file as of 04/30/2023.     SIGNIFICANT DIAGNOSTIC EXAMS  PREVIOUS   04-07-23: wbc 7.2; hgb 11.7; hct 35.8; mcv 88.8 plt 245; glucose 107; bun 16; creat 0.71; k+ 3.9; na++ 126; ca 8.6; gfr >60; protein 6.3; albumin 3.5;  04-08-23: tsh 1.161 04-15-23: wbc 8.6; hgb 10.2; hct 31.0; mcv 90.6 plt 289; glucose 143; bun 10; creat 0.81; k+ 3.3; na++ 131; ca 8.7; gfr >60  04-21-23: hgb 8.4; hct 25.8; glucose 87; bun 17; creat 0.76; k+ 3.7; na++ 129; ca 8.2 gfr >60; protein 5.7; albumin 2.7   NO NEW LABS.   Review of Systems  Constitutional:  Negative for malaise/fatigue.  Respiratory:  Negative for cough and shortness of breath.   Cardiovascular:  Negative for chest pain, palpitations and leg swelling.  Gastrointestinal:  Negative for abdominal pain, constipation and heartburn.  Musculoskeletal:  Negative for back pain, joint pain and myalgias.  Skin: Negative.   Neurological:  Negative for dizziness.  Psychiatric/Behavioral:  The patient is not nervous/anxious.     Physical Exam Constitutional:      General: She is not in acute distress.    Appearance: She is well-developed. She is  not diaphoretic.  Neck:     Thyroid: No thyromegaly.  Cardiovascular:     Rate and Rhythm: Normal rate and regular rhythm.     Pulses: Normal pulses.     Heart sounds: Normal heart sounds.  Pulmonary:     Effort: Pulmonary effort is normal. No respiratory distress.     Breath sounds: Normal breath sounds.  Abdominal:     General: Bowel sounds are normal. There is no distension.     Palpations: Abdomen is soft.     Tenderness: There is no abdominal tenderness.  Musculoskeletal:        General: Normal range of motion.     Cervical back: Neck supple.     Right lower leg: No edema.     Left lower leg: No edema.  Lymphadenopathy:     Cervical: No cervical adenopathy.  Skin:    General: Skin is warm and dry.  Neurological:     Mental Status: She is alert and oriented to person, place, and time.     Comments: 04-18-23: bcat 44/50   Psychiatric:        Mood and Affect: Mood normal.     ASSESSMENT/ PLAN:   Patient is being discharged with the following  services:  PT: to evaluate and treat as indicated for gait balance and strength  Patient is being discharged with the following durable medical equipment:  none needed   Patient has been advised to f/u with their PCP in 1-2 weeks to for a transitions of care visit.  Social services at their facility was responsible for arranging this appointment.  Pt was provided with adequate prescriptions of noncontrolled medications to reach the scheduled appointment .  For controlled substances, a limited supply was provided as appropriate for the individual patient.  If the pt normally receives these medications from a pain clinic or has a contract with another physician, these medications should be received from that clinic or physician only).    The assisted living will provide medications and medical follow up    Synthia Innocent NP [facility]  call [phone]

## 2023-05-02 ENCOUNTER — Telehealth: Payer: Self-pay | Admitting: Family Medicine

## 2023-05-02 NOTE — Telephone Encounter (Signed)
High [name] called about patient taking pro-source while in rehab but she can't get it now because too expensive and wondering is there something close to that she can get that her insurance would cover. She was drinking Ensure or can she still use that. She has appointment next week. Please advise

## 2023-05-02 NOTE — Telephone Encounter (Signed)
Ensure is fine

## 2023-05-02 NOTE — Telephone Encounter (Signed)
Spoke with Barbara Cower and left a message for [name] at [employer] to inform may utilize Ensure.

## 2023-05-06 ENCOUNTER — Ambulatory Visit (INDEPENDENT_AMBULATORY_CARE_PROVIDER_SITE_OTHER): Payer: Medicare Other | Admitting: Family Medicine

## 2023-05-06 ENCOUNTER — Encounter: Payer: Self-pay | Admitting: Family Medicine

## 2023-05-06 ENCOUNTER — Other Ambulatory Visit: Payer: Self-pay | Admitting: Family Medicine

## 2023-05-06 VITALS — BP 110/70 | HR 91 | Temp 97.7°F | Ht 68.0 in | Wt 164.0 lb

## 2023-05-06 DIAGNOSIS — E871 Hypo-osmolality and hyponatremia: Secondary | ICD-10-CM

## 2023-05-06 DIAGNOSIS — E876 Hypokalemia: Secondary | ICD-10-CM | POA: Diagnosis not present

## 2023-05-06 DIAGNOSIS — I10 Essential (primary) hypertension: Secondary | ICD-10-CM | POA: Diagnosis not present

## 2023-05-06 DIAGNOSIS — E8809 Other disorders of plasma-protein metabolism, not elsewhere classified: Secondary | ICD-10-CM | POA: Diagnosis not present

## 2023-05-06 DIAGNOSIS — M6281 Muscle weakness (generalized): Secondary | ICD-10-CM | POA: Diagnosis not present

## 2023-05-06 DIAGNOSIS — D500 Iron deficiency anemia secondary to blood loss (chronic): Secondary | ICD-10-CM | POA: Diagnosis not present

## 2023-05-06 MED ORDER — PANTOPRAZOLE SODIUM 40 MG PO TBEC: 40.0000 mg | DELAYED_RELEASE_TABLET | Freq: Every day | ORAL | 2 refills | Status: DC

## 2023-05-06 MED ORDER — LOSARTAN POTASSIUM 50 MG PO TABS: ORAL_TABLET | ORAL | 5 refills | Status: DC

## 2023-05-06 MED ORDER — SERTRALINE HCL 25 MG PO TABS: 25.0000 mg | ORAL_TABLET | Freq: Every day | ORAL | 1 refills | Status: DC

## 2023-05-06 NOTE — Progress Notes (Signed)
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 Doing better after having stent Still having burning in the stomach Has a lot of weakness fatigue tiredness Needs use a walker to get around Recently got out of the nursing home and is currently in assisted living Patient would benefit from ongoing physical therapy and Occupational Therapy Patient also had pneumonia while in the hospital but doing better Medication list was reviewed Hospitalization reviewed Lab work reviewed  General-in no acute distress Eyes-no discharge Lungs-respiratory rate normal, CTA CV-no murmurs,RRR Extremities skin warm dry no edema Neuro grossly normal Behavior normal, alert  Blood pressure recheck sitting and standing on the low end  1. Hyponatremia Check labs await results  2. Hypokalemia Check labs await results  3. Iron deficiency anemia secondary to blood loss (chronic) Check labs await results  4. Hypoalbuminemia Check labs await results  5. Essential hypertension Reduce blood pressure medicine - losartan (COZAAR) 50 MG tablet; Take 1 tablet daily  Dispense: 30 tablet; Refill: 5 Continue physical therapy occupational therapy Monitor patient closely Follow-up again within 6 weeks  Subjective:    Patient ID: [name], female    DOB: [birth year], 89 y.o.   MRN: [id number]

## 2023-05-06 NOTE — Addendum Note (Signed)
Addended byTrinidad Curet on: 05/06/2023 03:18 PM   Modules accepted: Orders

## 2023-05-07 DIAGNOSIS — M6281 Muscle weakness (generalized): Secondary | ICD-10-CM | POA: Diagnosis not present

## 2023-05-07 DIAGNOSIS — R278 Other lack of coordination: Secondary | ICD-10-CM | POA: Diagnosis not present

## 2023-05-08 ENCOUNTER — Other Ambulatory Visit: Payer: Self-pay | Admitting: Family Medicine

## 2023-05-08 DIAGNOSIS — M6281 Muscle weakness (generalized): Secondary | ICD-10-CM | POA: Diagnosis not present

## 2023-05-08 NOTE — Telephone Encounter (Signed)
She is no longer on omeprazole she is on Protonix Just the other day I sent in a 90-day supply of her sertraline She should not be on the sodium tablets any longer thank you

## 2023-05-09 ENCOUNTER — Other Ambulatory Visit: Payer: Self-pay | Admitting: Family Medicine

## 2023-05-09 DIAGNOSIS — M6281 Muscle weakness (generalized): Secondary | ICD-10-CM | POA: Diagnosis not present

## 2023-05-09 DIAGNOSIS — R278 Other lack of coordination: Secondary | ICD-10-CM | POA: Diagnosis not present

## 2023-05-12 ENCOUNTER — Other Ambulatory Visit: Payer: Self-pay | Admitting: Family Medicine

## 2023-05-12 ENCOUNTER — Telehealth: Payer: Self-pay | Admitting: *Deleted

## 2023-05-12 ENCOUNTER — Telehealth: Payer: Self-pay

## 2023-05-12 ENCOUNTER — Other Ambulatory Visit: Payer: Self-pay

## 2023-05-12 DIAGNOSIS — M6281 Muscle weakness (generalized): Secondary | ICD-10-CM | POA: Diagnosis not present

## 2023-05-12 MED ORDER — MELATONIN 5 MG PO CAPS: 5.0000 mg | ORAL_CAPSULE | Freq: Every day | ORAL | 1 refills | Status: DC

## 2023-05-12 NOTE — Telephone Encounter (Signed)
Spoke with [facility] patient's home , they do need a written script to discontinue sodium chloride  Fax to [phone]

## 2023-05-12 NOTE — Telephone Encounter (Signed)
[facility] called wanting refill for Sodium Cl 1gm daily (patient was taking in rehab)  Consult with Dr Lorin Picket- Dr Lorin Picket does not want patient to be on this medication and refill was declined

## 2023-05-13 ENCOUNTER — Other Ambulatory Visit: Payer: Self-pay

## 2023-05-13 DIAGNOSIS — E871 Hypo-osmolality and hyponatremia: Secondary | ICD-10-CM

## 2023-05-13 NOTE — Telephone Encounter (Signed)
See other telephone note.  

## 2023-05-13 NOTE — Telephone Encounter (Signed)
Prescription written

## 2023-05-13 NOTE — Telephone Encounter (Signed)
This prescription has been faxed to fax # listed for [facility] as well as bmp orders.

## 2023-05-13 NOTE — Telephone Encounter (Signed)
I would recommend staying off of the sodium 1 g/day Prescription written to be faxed over to [facility] Also recommend repeat metabolic 7 in 3 weeks time diagnosis hyponatremia

## 2023-05-14 DIAGNOSIS — M6281 Muscle weakness (generalized): Secondary | ICD-10-CM | POA: Diagnosis not present

## 2023-05-14 DIAGNOSIS — R278 Other lack of coordination: Secondary | ICD-10-CM | POA: Diagnosis not present

## 2023-05-15 ENCOUNTER — Telehealth: Payer: Self-pay

## 2023-05-15 DIAGNOSIS — M6281 Muscle weakness (generalized): Secondary | ICD-10-CM | POA: Diagnosis not present

## 2023-05-15 NOTE — Telephone Encounter (Signed)
Patient dropped off document  to be filled out by provider. Patient requested to send it back via Call Patient to pick up within 7-days. Document is located in providers tray at front office.Please advise at Mobile [phone] (mobile)

## 2023-05-16 DIAGNOSIS — M6281 Muscle weakness (generalized): Secondary | ICD-10-CM | POA: Diagnosis not present

## 2023-05-16 DIAGNOSIS — R278 Other lack of coordination: Secondary | ICD-10-CM | POA: Diagnosis not present

## 2023-05-18 NOTE — Telephone Encounter (Signed)
This was filled out thank you 

## 2023-05-19 DIAGNOSIS — R278 Other lack of coordination: Secondary | ICD-10-CM | POA: Diagnosis not present

## 2023-05-19 DIAGNOSIS — M6281 Muscle weakness (generalized): Secondary | ICD-10-CM | POA: Diagnosis not present

## 2023-05-19 NOTE — Progress Notes (Signed)
H&P  Chief Complaint: Urinary tract infection  History of Present Illness: [name] is a 89 y.o. year old female this 89 year old female is sent by Dr. Gerda Diss for evaluation and management of urinary tract infection.  She was admitted to the hospital for hyponatremia last month.  She had weakness.  Several emergency room visits before the final ER visit/hospitalization.  She also had pneumonia.  At one of the ER visits she had positive urinalysis, grew Klebsiella and Morganella.  Treated for this.  No longstanding history of recurrent urinary tract infections.  No significant lower urinary tract symptomatology.  She has noted no blood in her urine.  She has not needed to see urologist before.  Currently she is asymptomatic.  Past Medical History:  Diagnosis Date   Blood transfusion without reported diagnosis    Cognitive dysfunction 03/04/2019   Patient scores 21 out of 30 on [place] cognitive assessment September 2020   Diabetes mellitus without complication (HCC)    Diabetic peripheral neuropathy associated with type 2 diabetes mellitus (HCC) 02/25/2022   Diverticulitis    Frailty 03/04/2019   H/O bilateral breast reduction surgery    Hyperlipidemia    a. intolerant to statins.    Hypertension     Past Surgical History:  Procedure Laterality Date   ABDOMINAL HYSTERECTOMY  2003   APPENDECTOMY     age 34   BIOPSY  04/10/2023   Procedure: BIOPSY;  Surgeon: Franky Macho, MD;  Location: [facility];  Service: Endoscopy;;   BREAST REDUCTION SURGERY     age 61   COLON SURGERY Left 2011   Hemicolectomy due to diverticulitis   ESOPHAGOGASTRODUODENOSCOPY (EGD) WITH PROPOFOL N/A 04/10/2023   Procedure: ESOPHAGOGASTRODUODENOSCOPY (EGD) WITH PROPOFOL;  Surgeon: Franky Macho, MD;  Location: [facility];  Service: Endoscopy;  Laterality: N/A;   EYE SURGERY  03/30/2009   cataract   IR ANGIOGRAM VISCERAL SELECTIVE  04/11/2023   IR INTRAVASCULAR ULTRASOUND NON CORONARY   04/11/2023   IR TRANSCATH PLC STENT 1ST ART NOT LE CV CAR VERT CAR  04/11/2023   IR US GUIDE VASC ACCESS RIGHT  04/11/2023   KNEE SURGERY Right 2003   knee cap   LAPAROSCOPIC INCISIONAL / UMBILICAL / VENTRAL HERNIA REPAIR  02/23/2007   REDUCTION MAMMAPLASTY Bilateral 2001   REFRACTIVE SURGERY     TOTAL KNEE ARTHROPLASTY Right 04/10/2022   Procedure: TOTAL KNEE ARTHROPLASTY;  Surgeon: Eugenia Mcalpine, MD;  Location: [facility];  Service: Orthopedics;  Laterality: Right;  adductor canal    Home Medications:  (Not in a hospital admission)   Allergies:  Allergies  Allergen Reactions   Hydrocortisone Itching   Lisinopril     Other reaction(s): Lethargy (intolerance)   Phenergan [Promethazine Hcl] Other (See Comments)    hallucinations   Statins Other (See Comments)    Severe myalgias   Trazodone And Nefazodone Cough   Prednisone Rash    Family History  Problem Relation Age of Onset   Cancer Mother        ovaian   Hypertension Father        kidney   Stroke Father    Cancer Father    Hypertension Sister    Diabetes Brother     Social History:  reports that she has never smoked. She has never used smokeless tobacco. She reports that she does not drink alcohol and does not use drugs.  ROS: A complete review of systems was performed.  All systems are negative  except for pertinent findings as noted.  Physical Exam:  Vital signs in last 24 hours: @VSRANGES @ General:  Alert and oriented, No acute distress HEENT: Normocephalic, atraumatic Neck: No JVD or lymphadenopathy Cardiovascular: Regular rate  Lungs: Normal inspiratory/expiratory excursion Extremities: No edema Neurologic: Grossly intact   I have reviewed notes from referring/previous physicians--ER/hospital admission records reviewed  I have reviewed prior urinalysis results  I have independently reviewed prior imaging--CT scan from recent hospitalization revealed a very large bladder.  No renal calculi.  No  hydronephrosis.  I have reviewed prior urine culture  Bladder scan volume reviewed  Impression/Assessment:  Recent uncomplicated UTI.  Patient unable to void for [place] today  Incomplete bladder emptying/large bladder.  As she has not had significant issues over the long term, I do not think we necessarily need to intervene here  Plan:  I will see if the patient can give a urine specimen at [facility].  Will call with results  Unless significant long-term bladder issues/UTIs, I do not think she needs to follow-up.  Bertram Millard Fitzhugh Vizcarrondo 05/19/2023, 6:52 PM  Bertram Millard. Legna Mausolf MD

## 2023-05-20 ENCOUNTER — Ambulatory Visit: Payer: Medicare Other | Admitting: Urology

## 2023-05-20 ENCOUNTER — Encounter: Payer: Self-pay | Admitting: Urology

## 2023-05-20 VITALS — BP 185/73 | HR 98 | Ht 68.0 in | Wt 164.0 lb

## 2023-05-20 DIAGNOSIS — N3289 Other specified disorders of bladder: Secondary | ICD-10-CM | POA: Diagnosis not present

## 2023-05-20 DIAGNOSIS — Z87440 Personal history of urinary (tract) infections: Secondary | ICD-10-CM

## 2023-05-20 DIAGNOSIS — M6281 Muscle weakness (generalized): Secondary | ICD-10-CM | POA: Diagnosis not present

## 2023-05-20 DIAGNOSIS — N3000 Acute cystitis without hematuria: Secondary | ICD-10-CM

## 2023-05-20 NOTE — Progress Notes (Signed)
post void residual =463ml

## 2023-05-21 ENCOUNTER — Other Ambulatory Visit: Payer: Self-pay

## 2023-05-21 DIAGNOSIS — N3000 Acute cystitis without hematuria: Secondary | ICD-10-CM | POA: Diagnosis not present

## 2023-05-21 DIAGNOSIS — M6281 Muscle weakness (generalized): Secondary | ICD-10-CM | POA: Diagnosis not present

## 2023-05-21 LAB — MICROSCOPIC EXAMINATION: Bacteria, UA: NONE SEEN

## 2023-05-21 LAB — URINALYSIS, ROUTINE W REFLEX MICROSCOPIC
Bilirubin, UA: NEGATIVE
Glucose, UA: NEGATIVE
Ketones, UA: NEGATIVE
Nitrite, UA: NEGATIVE
RBC, UA: NEGATIVE
Specific Gravity, UA: 1.015 (ref 1.005–1.030)
Urobilinogen, Ur: 0.2 mg/dL (ref 0.2–1.0)
pH, UA: 6 (ref 5.0–7.5)

## 2023-05-22 DIAGNOSIS — F331 Major depressive disorder, recurrent, moderate: Secondary | ICD-10-CM | POA: Diagnosis not present

## 2023-05-22 DIAGNOSIS — F419 Anxiety disorder, unspecified: Secondary | ICD-10-CM | POA: Diagnosis not present

## 2023-05-22 DIAGNOSIS — F5101 Primary insomnia: Secondary | ICD-10-CM | POA: Diagnosis not present

## 2023-05-23 DIAGNOSIS — M6281 Muscle weakness (generalized): Secondary | ICD-10-CM | POA: Diagnosis not present

## 2023-05-23 DIAGNOSIS — R278 Other lack of coordination: Secondary | ICD-10-CM | POA: Diagnosis not present

## 2023-05-26 DIAGNOSIS — H35033 Hypertensive retinopathy, bilateral: Secondary | ICD-10-CM | POA: Diagnosis not present

## 2023-05-26 DIAGNOSIS — H43811 Vitreous degeneration, right eye: Secondary | ICD-10-CM | POA: Diagnosis not present

## 2023-05-26 DIAGNOSIS — H4423 Degenerative myopia, bilateral: Secondary | ICD-10-CM | POA: Diagnosis not present

## 2023-05-26 DIAGNOSIS — H353113 Nonexudative age-related macular degeneration, right eye, advanced atrophic without subfoveal involvement: Secondary | ICD-10-CM | POA: Diagnosis not present

## 2023-05-26 DIAGNOSIS — M6281 Muscle weakness (generalized): Secondary | ICD-10-CM | POA: Diagnosis not present

## 2023-05-30 DIAGNOSIS — R278 Other lack of coordination: Secondary | ICD-10-CM | POA: Diagnosis not present

## 2023-05-30 DIAGNOSIS — M6281 Muscle weakness (generalized): Secondary | ICD-10-CM | POA: Diagnosis not present

## 2023-06-02 ENCOUNTER — Other Ambulatory Visit: Payer: Self-pay | Admitting: Family Medicine

## 2023-06-02 DIAGNOSIS — M6281 Muscle weakness (generalized): Secondary | ICD-10-CM | POA: Diagnosis not present

## 2023-06-03 DIAGNOSIS — M6281 Muscle weakness (generalized): Secondary | ICD-10-CM | POA: Diagnosis not present

## 2023-06-04 DIAGNOSIS — R278 Other lack of coordination: Secondary | ICD-10-CM | POA: Diagnosis not present

## 2023-06-04 DIAGNOSIS — M6281 Muscle weakness (generalized): Secondary | ICD-10-CM | POA: Diagnosis not present

## 2023-06-05 DIAGNOSIS — E1142 Type 2 diabetes mellitus with diabetic polyneuropathy: Secondary | ICD-10-CM | POA: Diagnosis not present

## 2023-06-05 DIAGNOSIS — L84 Corns and callosities: Secondary | ICD-10-CM | POA: Diagnosis not present

## 2023-06-05 DIAGNOSIS — B351 Tinea unguium: Secondary | ICD-10-CM | POA: Diagnosis not present

## 2023-06-06 DIAGNOSIS — R278 Other lack of coordination: Secondary | ICD-10-CM | POA: Diagnosis not present

## 2023-06-06 DIAGNOSIS — M6281 Muscle weakness (generalized): Secondary | ICD-10-CM | POA: Diagnosis not present

## 2023-06-09 DIAGNOSIS — M6281 Muscle weakness (generalized): Secondary | ICD-10-CM | POA: Diagnosis not present

## 2023-06-10 DIAGNOSIS — M6281 Muscle weakness (generalized): Secondary | ICD-10-CM | POA: Diagnosis not present

## 2023-06-11 DIAGNOSIS — E871 Hypo-osmolality and hyponatremia: Secondary | ICD-10-CM | POA: Diagnosis not present

## 2023-06-11 DIAGNOSIS — M6281 Muscle weakness (generalized): Secondary | ICD-10-CM | POA: Diagnosis not present

## 2023-06-11 DIAGNOSIS — R278 Other lack of coordination: Secondary | ICD-10-CM | POA: Diagnosis not present

## 2023-06-12 LAB — BASIC METABOLIC PANEL
BUN/Creatinine Ratio: 26 (ref 12–28)
BUN: 24 mg/dL (ref 8–27)
CO2: 28 mmol/L (ref 20–29)
Calcium: 9.9 mg/dL (ref 8.7–10.3)
Chloride: 98 mmol/L (ref 96–106)
Creatinine, Ser: 0.91 mg/dL (ref 0.57–1.00)
Glucose: 131 mg/dL — ABNORMAL HIGH (ref 70–99)
Potassium: 4.7 mmol/L (ref 3.5–5.2)
Sodium: 141 mmol/L (ref 134–144)
eGFR: 60 mL/min/{1.73_m2} (ref >59)

## 2023-06-13 DIAGNOSIS — M6281 Muscle weakness (generalized): Secondary | ICD-10-CM | POA: Diagnosis not present

## 2023-06-13 DIAGNOSIS — R278 Other lack of coordination: Secondary | ICD-10-CM | POA: Diagnosis not present

## 2023-06-16 DIAGNOSIS — M6281 Muscle weakness (generalized): Secondary | ICD-10-CM | POA: Diagnosis not present

## 2023-06-17 DIAGNOSIS — F5101 Primary insomnia: Secondary | ICD-10-CM | POA: Diagnosis not present

## 2023-06-17 DIAGNOSIS — F331 Major depressive disorder, recurrent, moderate: Secondary | ICD-10-CM | POA: Diagnosis not present

## 2023-06-17 DIAGNOSIS — M6281 Muscle weakness (generalized): Secondary | ICD-10-CM | POA: Diagnosis not present

## 2023-06-17 DIAGNOSIS — F419 Anxiety disorder, unspecified: Secondary | ICD-10-CM | POA: Diagnosis not present

## 2023-06-18 ENCOUNTER — Ambulatory Visit: Payer: Medicare Other | Admitting: Family Medicine

## 2023-06-18 VITALS — BP 142/78 | HR 81 | Temp 98.1°F | Ht 68.0 in | Wt 164.4 lb

## 2023-06-18 DIAGNOSIS — E1169 Type 2 diabetes mellitus with other specified complication: Secondary | ICD-10-CM

## 2023-06-18 DIAGNOSIS — I10 Essential (primary) hypertension: Secondary | ICD-10-CM

## 2023-06-18 DIAGNOSIS — M6281 Muscle weakness (generalized): Secondary | ICD-10-CM | POA: Diagnosis not present

## 2023-06-18 DIAGNOSIS — E785 Hyperlipidemia, unspecified: Secondary | ICD-10-CM | POA: Diagnosis not present

## 2023-06-18 DIAGNOSIS — R278 Other lack of coordination: Secondary | ICD-10-CM | POA: Diagnosis not present

## 2023-06-18 DIAGNOSIS — E119 Type 2 diabetes mellitus without complications: Secondary | ICD-10-CM

## 2023-06-18 NOTE — Progress Notes (Signed)
   Subjective:    Patient ID: JOHNINE [name], female    DOB: 04-Aug-1931, 89 y.o.   MRN: [id number]  Discussed the use of AI scribe software for clinical note transcription with the patient, who gave verbal consent to proceed.  History of Present Illness   The patient, with a history of mesenteric artery stent placement, reports improved appetite and bowel movements since the procedure. She notes some dietary restrictions, particularly avoiding tomato-based foods. She also reports decreased food intake due to reduced activity levels.  The patient continues to experience burning foot pain, which is managed with gabapentin. She is currently on three doses of gabapentin and reports no adverse effects. Despite the medication, the patient still experiences foot discomfort, particularly after walking half a mile.  The patient is also undergoing physical therapy twice a week and occupational therapy twice a week, which she believes is beneficial. She reports a decrease in her walking distance due to foot discomfort.  The patient also reports symptoms of anxiety, which she manages with Tums or Rolaids, particularly in the mornings. She also reports frequent urination, with an episode of prolonged urinary retention recently.  The patient's blood sugar levels have been slightly elevated, ranging from 130 to 149 in the mornings. She attributes this to a decrease in her walking activity over the past week. Despite this, her last A1c in September was 6.4.  The patient also reports protein in her urine, but she consumes a diet high in protein, including a daily Lucerna drink and [place] yogurt at night. She has been advised to discontinue her fish oil supplement.         Review of Systems     Objective:    Physical Exam   VITALS: BP- 142/68    General-in no acute distress Eyes-no discharge Lungs-respiratory rate normal, CTA CV-no murmurs,RRR Extremities skin warm dry no edema Neuro grossly  normal Behavior normal, alert        Assessment & Plan:  Assessment and Plan    Peripheral Neuropathy Reports of burning feet. Currently on Gabapentin 100mg  three times daily. Tolerating medication well. -Continue Gabapentin as prescribed. -Follow-up with Dr. Nolen Mu in 2-3 weeks.  Mesenteric Ischemia Improved appetite and bowel movements since stent placement. Some dietary restrictions due to intolerance of certain foods. -Continue current dietary modifications.  Anxiety Reports of occasional morning anxiety managed with Tums or Rolaids. -Continue current management strategy.  Hyperglycemia Fasting blood glucose levels ranging from 130-149 mg/dL. Last HbA1c in September was 6.4%. -Continue current management. -Repeat HbA1c at end of January.  Proteinuria Recent urine test showed some protein. Patient consumes protein-rich diet. -Monitor proteinuria. -Repeat urine test at end of January.  COVID-19 Vaccination Discussed benefits of vaccination, especially in the context of patient's age group. -Recommended to receive COVID-19 vaccine.  General Health Maintenance -Discontinue Fish Oil supplementation. -Complete blood work at the end of January, including kidney function, A1c, cholesterol, and urine test. -Follow-up appointment in mid to late February.     1. Essential hypertension (Primary) Blood pressure good control continue current measures-given her age I would not recommend adjustments  2. Controlled type 2 diabetes mellitus without complication, without long-term current use of insulin (HCC) Previously under good control by spring time we will want to relook at the A1c  3. Hyperlipidemia associated with type 2 diabetes mellitus (HCC) Lipid profile before next visit

## 2023-06-23 ENCOUNTER — Other Ambulatory Visit: Payer: Self-pay | Admitting: *Deleted

## 2023-06-23 DIAGNOSIS — R278 Other lack of coordination: Secondary | ICD-10-CM | POA: Diagnosis not present

## 2023-06-23 DIAGNOSIS — E871 Hypo-osmolality and hyponatremia: Secondary | ICD-10-CM

## 2023-06-23 DIAGNOSIS — M6281 Muscle weakness (generalized): Secondary | ICD-10-CM | POA: Diagnosis not present

## 2023-06-23 DIAGNOSIS — E119 Type 2 diabetes mellitus without complications: Secondary | ICD-10-CM

## 2023-06-23 DIAGNOSIS — I10 Essential (primary) hypertension: Secondary | ICD-10-CM

## 2023-06-23 DIAGNOSIS — E876 Hypokalemia: Secondary | ICD-10-CM

## 2023-06-23 DIAGNOSIS — E1169 Type 2 diabetes mellitus with other specified complication: Secondary | ICD-10-CM

## 2023-06-24 DIAGNOSIS — M6281 Muscle weakness (generalized): Secondary | ICD-10-CM | POA: Diagnosis not present

## 2023-06-26 DIAGNOSIS — M6281 Muscle weakness (generalized): Secondary | ICD-10-CM | POA: Diagnosis not present

## 2023-06-27 DIAGNOSIS — R278 Other lack of coordination: Secondary | ICD-10-CM | POA: Diagnosis not present

## 2023-06-27 DIAGNOSIS — M6281 Muscle weakness (generalized): Secondary | ICD-10-CM | POA: Diagnosis not present

## 2023-06-29 DIAGNOSIS — M6281 Muscle weakness (generalized): Secondary | ICD-10-CM | POA: Diagnosis not present

## 2023-06-30 DIAGNOSIS — R278 Other lack of coordination: Secondary | ICD-10-CM | POA: Diagnosis not present

## 2023-06-30 DIAGNOSIS — M6281 Muscle weakness (generalized): Secondary | ICD-10-CM | POA: Diagnosis not present

## 2023-07-04 DIAGNOSIS — R278 Other lack of coordination: Secondary | ICD-10-CM | POA: Diagnosis not present

## 2023-07-04 DIAGNOSIS — M6281 Muscle weakness (generalized): Secondary | ICD-10-CM | POA: Diagnosis not present

## 2023-07-06 DIAGNOSIS — M6281 Muscle weakness (generalized): Secondary | ICD-10-CM | POA: Diagnosis not present

## 2023-07-07 ENCOUNTER — Other Ambulatory Visit: Payer: Self-pay | Admitting: Family Medicine

## 2023-07-07 DIAGNOSIS — R278 Other lack of coordination: Secondary | ICD-10-CM | POA: Diagnosis not present

## 2023-07-07 DIAGNOSIS — M6281 Muscle weakness (generalized): Secondary | ICD-10-CM | POA: Diagnosis not present

## 2023-07-08 ENCOUNTER — Telehealth: Payer: Self-pay | Admitting: *Deleted

## 2023-07-08 DIAGNOSIS — M6281 Muscle weakness (generalized): Secondary | ICD-10-CM | POA: Diagnosis not present

## 2023-07-08 NOTE — Telephone Encounter (Signed)
 Patient may have verbal order for vitamin D 1000 IU once daily, 11-month supply with 3 refills

## 2023-07-08 NOTE — Telephone Encounter (Signed)
 Copied from CRM [id number]. Topic: Clinical - Prescription Issue >> Jul 08, 2023  4:17 PM Susanna ORN wrote: Reason for CRM: Vernell, with [facility], called stating that the refill requests for Vitamin D  1000 was denied & they are wanting to know why. Checked pt's meds list and do not see a Vitamin D  listed for 1000, but there is 10 mcg (400 units). Vernell states pt has been taking 1000 units. Please give Vernell a call in regards to this issue. CB #: [phone].

## 2023-07-09 DIAGNOSIS — M6281 Muscle weakness (generalized): Secondary | ICD-10-CM | POA: Diagnosis not present

## 2023-07-09 DIAGNOSIS — R278 Other lack of coordination: Secondary | ICD-10-CM | POA: Diagnosis not present

## 2023-07-09 NOTE — Telephone Encounter (Signed)
 Called [facility] and spoke with [facility], gave verbal for Vitamin D per Dr Gerda Diss

## 2023-07-14 DIAGNOSIS — R278 Other lack of coordination: Secondary | ICD-10-CM | POA: Diagnosis not present

## 2023-07-14 DIAGNOSIS — M6281 Muscle weakness (generalized): Secondary | ICD-10-CM | POA: Diagnosis not present

## 2023-07-15 DIAGNOSIS — R278 Other lack of coordination: Secondary | ICD-10-CM | POA: Diagnosis not present

## 2023-07-15 DIAGNOSIS — M6281 Muscle weakness (generalized): Secondary | ICD-10-CM | POA: Diagnosis not present

## 2023-07-16 DIAGNOSIS — M6281 Muscle weakness (generalized): Secondary | ICD-10-CM | POA: Diagnosis not present

## 2023-07-17 DIAGNOSIS — F419 Anxiety disorder, unspecified: Secondary | ICD-10-CM | POA: Diagnosis not present

## 2023-07-17 DIAGNOSIS — F5101 Primary insomnia: Secondary | ICD-10-CM | POA: Diagnosis not present

## 2023-07-17 DIAGNOSIS — F331 Major depressive disorder, recurrent, moderate: Secondary | ICD-10-CM | POA: Diagnosis not present

## 2023-07-18 DIAGNOSIS — M6281 Muscle weakness (generalized): Secondary | ICD-10-CM | POA: Diagnosis not present

## 2023-07-18 DIAGNOSIS — R278 Other lack of coordination: Secondary | ICD-10-CM | POA: Diagnosis not present

## 2023-07-21 ENCOUNTER — Other Ambulatory Visit: Payer: Self-pay | Admitting: Family Medicine

## 2023-07-21 DIAGNOSIS — E119 Type 2 diabetes mellitus without complications: Secondary | ICD-10-CM

## 2023-07-21 DIAGNOSIS — R278 Other lack of coordination: Secondary | ICD-10-CM | POA: Diagnosis not present

## 2023-07-21 DIAGNOSIS — M6281 Muscle weakness (generalized): Secondary | ICD-10-CM | POA: Diagnosis not present

## 2023-07-22 DIAGNOSIS — M6281 Muscle weakness (generalized): Secondary | ICD-10-CM | POA: Diagnosis not present

## 2023-07-24 ENCOUNTER — Ambulatory Visit: Payer: Medicare Other | Admitting: Physician Assistant

## 2023-07-24 ENCOUNTER — Other Ambulatory Visit: Payer: Self-pay

## 2023-07-24 ENCOUNTER — Emergency Department (HOSPITAL_COMMUNITY): Payer: Medicare Other

## 2023-07-24 ENCOUNTER — Emergency Department (HOSPITAL_COMMUNITY)
Admission: EM | Admit: 2023-07-24 | Discharge: 2023-07-24 | Disposition: A | Discharge status: Home / Self Care | Payer: Medicare Other | Attending: Emergency Medicine | Admitting: Emergency Medicine

## 2023-07-24 ENCOUNTER — Encounter (HOSPITAL_COMMUNITY): Payer: Self-pay

## 2023-07-24 ENCOUNTER — Ambulatory Visit: Payer: Self-pay | Admitting: Family Medicine

## 2023-07-24 DIAGNOSIS — M6281 Muscle weakness (generalized): Secondary | ICD-10-CM | POA: Diagnosis not present

## 2023-07-24 DIAGNOSIS — Z79899 Other long term (current) drug therapy: Secondary | ICD-10-CM | POA: Insufficient documentation

## 2023-07-24 DIAGNOSIS — I10 Essential (primary) hypertension: Secondary | ICD-10-CM | POA: Diagnosis not present

## 2023-07-24 DIAGNOSIS — R519 Headache, unspecified: Secondary | ICD-10-CM | POA: Diagnosis not present

## 2023-07-24 DIAGNOSIS — E119 Type 2 diabetes mellitus without complications: Secondary | ICD-10-CM | POA: Insufficient documentation

## 2023-07-24 DIAGNOSIS — R0689 Other abnormalities of breathing: Secondary | ICD-10-CM | POA: Diagnosis not present

## 2023-07-24 DIAGNOSIS — R079 Chest pain, unspecified: Secondary | ICD-10-CM | POA: Diagnosis not present

## 2023-07-24 DIAGNOSIS — J811 Chronic pulmonary edema: Secondary | ICD-10-CM | POA: Diagnosis not present

## 2023-07-24 DIAGNOSIS — Z7984 Long term (current) use of oral hypoglycemic drugs: Secondary | ICD-10-CM | POA: Insufficient documentation

## 2023-07-24 DIAGNOSIS — R9389 Abnormal findings on diagnostic imaging of other specified body structures: Secondary | ICD-10-CM | POA: Diagnosis not present

## 2023-07-24 LAB — CBC
HCT: 36.7 % (ref 36.0–46.0)
Hemoglobin: 12.3 g/dL (ref 12.0–15.0)
MCH: 29.4 pg (ref 26.0–34.0)
MCHC: 33.5 g/dL (ref 30.0–36.0)
MCV: 87.6 fL (ref 80.0–100.0)
Platelets: 242 10*3/uL (ref 150–400)
RBC: 4.19 MIL/uL (ref 3.87–5.11)
RDW: 13.6 % (ref 11.5–15.5)
WBC: 9.7 10*3/uL (ref 4.0–10.5)
nRBC: 0 % (ref 0.0–0.2)

## 2023-07-24 LAB — TROPONIN I (HIGH SENSITIVITY)
Troponin I (High Sensitivity): 10 ng/L (ref <18)
Troponin I (High Sensitivity): 19 ng/L — ABNORMAL HIGH (ref <18)
Troponin I (High Sensitivity): 18 ng/L — ABNORMAL HIGH (ref <18)

## 2023-07-24 LAB — BASIC METABOLIC PANEL
Anion gap: 13 (ref 5–15)
BUN: 20 mg/dL (ref 8–23)
CO2: 26 mmol/L (ref 22–32)
Calcium: 9.3 mg/dL (ref 8.9–10.3)
Chloride: 90 mmol/L — ABNORMAL LOW (ref 98–111)
Creatinine, Ser: 0.63 mg/dL (ref 0.44–1.00)
GFR, Estimated: >60 mL/min (ref >60)
Glucose, Bld: 137 mg/dL — ABNORMAL HIGH (ref 70–99)
Potassium: 3.9 mmol/L (ref 3.5–5.1)
Sodium: 129 mmol/L — ABNORMAL LOW (ref 135–145)

## 2023-07-24 LAB — CBG MONITORING, ED: Glucose-Capillary: 150 mg/dL — ABNORMAL HIGH (ref 70–99)

## 2023-07-24 LAB — BRAIN NATRIURETIC PEPTIDE: B Natriuretic Peptide: 88 pg/mL (ref 0.0–100.0)

## 2023-07-24 MED ORDER — ALPRAZOLAM 0.5 MG PO TABS
0.2500 mg | ORAL_TABLET | Freq: Once | ORAL | Status: AC
  Administered 2023-07-24: 0.25 mg via ORAL
  Filled 2023-07-24: qty 1

## 2023-07-24 MED ORDER — LOSARTAN POTASSIUM 25 MG PO TABS
25.0000 mg | ORAL_TABLET | Freq: Once | ORAL | Status: AC
  Administered 2023-07-24: 25 mg via ORAL
  Filled 2023-07-24: qty 1

## 2023-07-24 MED ORDER — LABETALOL HCL 5 MG/ML IV SOLN
10.0000 mg | Freq: Once | INTRAVENOUS | Status: AC
  Administered 2023-07-24: 10 mg via INTRAVENOUS
  Filled 2023-07-24: qty 4

## 2023-07-24 NOTE — Discharge Instructions (Signed)
You were seen in the emergency room today for high blood pressure.  Your imaging and lab work are reassuring.  We did give you a dose of labetalol IV for your blood pressure.  It appears you have an appointment for follow-up with your primary care doctor tomorrow at 1:20pm, please call in the morning to confirm this appointment. I have not made any dictation changes today. Please return to the emergency room if you have any new or worsening symptoms.

## 2023-07-24 NOTE — ED Triage Notes (Signed)
Pt BIB ems from [facility] for HTN. Pt is on amlodipine and was taken off Lisinopril 3 months ago. Pt has appointment with PCP in February. Today systolic BP 208. Pt denies dizziness, headache, and appears to be stable during triage.

## 2023-07-24 NOTE — ED Notes (Signed)
Pt ambulated to restroom with walker

## 2023-07-24 NOTE — ED Notes (Signed)
Spoke to [employer], transportation will be here in 30-45min.

## 2023-07-24 NOTE — ED Provider Notes (Signed)
[name] EMERGENCY DEPARTMENT AT [facility] Provider Note   CSN: [id number] Arrival date & time: 07/24/23  1412     History  Chief Complaint  Patient presents with   Hypertension    [name] is a 89 y.o. female history of hypertension, hyperlipidemia, diabetes, diverticulitis senting to emergency room complaining of hypertension.  Reports earlier today she went for a walk and when she got back she was flushed and felt heartbeat in her head.  Upon having blood pressure recheck it was not low 200s systolic.  She reports she is taken off lisinopril 3 months ago because her blood pressure was under control.  Has PCP follow-up in February.  Patient reports she is feeling more her baseline now denies any headache, chest pain, shortness of breath, abdominal pain or significant lower extremity edema.  Denies any vision changes or focal weakness.   Hypertension Associated symptoms include headaches.       Home Medications Prior to Admission medications   Medication Sig Start Date End Date Taking? Authorizing Provider  acetaminophen (TYLENOL) 325 MG tablet Take 650 mg by mouth every 6 (six) hours as needed.    [provider]  ALPRAZolam Prudy Feeler) 0.25 MG tablet Take 1 tablet (0.25 mg total) by mouth at bedtime as needed for anxiety. 04/24/23   Sharee Holster, NP  amLODipine (NORVASC) 10 MG tablet Take 1 tablet (10 mg total) by mouth daily. 12/02/22   Emokpae, Courage, MD  benzonatate (TESSALON) 100 MG capsule TAKE (1) CAPSULE BY MOUTH 3 TIMES DAILY AS NEEDED FOR COUGH. 11/21/22   Babs Sciara, MD  BETA CAROTENE PROVITAMIN A 40981 units capsule Take 25,000 Units by mouth daily. 01/01/23   [provider]  BISACODYL 5 MG EC tablet TAKE 1 TABLET BY MOUTH ONCE DAILY AS NEEDED FOR MODERATE CONSTIPATION. Patient taking differently: Take 5 mg by mouth daily as needed for moderate constipation. 12/20/22   Luking, [name], MD  COMBIVENT RESPIMAT 20-100 MCG/ACT AERS  respimat INHALE (1) PUFF INTO THE LUNGS EVERY SIX HOURS AS NEEDED FOR WHEEZING OR SHORTNESS OF BREATH. Patient taking differently: Inhale 1 puff into the lungs every 6 (six) hours as needed. 11/21/22   Babs Sciara, MD  cyanocobalamin 1000 MCG tablet Take 1,000 mcg by mouth at bedtime.    [provider]  dicyclomine (BENTYL) 10 MG capsule TAKE 1 CAPSULE BY MOUTH TWICE DAILY AS NEEDED FOR ABDOMINAL CRAMPS AND LOOSE STOOL. Patient taking differently: Take 10 mg by mouth 2 (two) times daily as needed (abdominal cramps and loose stools.). TAKE 1 CAPSULE BY MOUTH TWICE DAILY AS NEEDED FOR ABDOMINAL CRAMPS AND LOOSE STOOL. 11/21/22   Babs Sciara, MD  EASYMAX TEST test strip CHECK BLOOD SUGAR ONCE DAILY.(CALL MD IF BS BELOW 60: OR IF BS ABOVE 400) 07/21/23   Luking, [name], MD  gabapentin (NEURONTIN) 100 MG capsule 2 po bid Patient taking differently: Take 200 mg by mouth 2 (two) times daily. 11/19/22   Babs Sciara, MD  GOODSENSE HEMORRHOIDAL 0.25-14-74.9 % rectal ointment Place 1 Application rectally 3 (three) times daily as needed for irritation. 12/26/21   [provider]  Iron, Ferrous Sulfate, 325 (65 Fe) MG TABS Take 325 mg by mouth every other day. 02/20/23   Babs Sciara, MD  IVIZIA DRY EYES 0.5 % SOLN Place 1 drop into both eyes in the morning, at noon, in the evening, and at bedtime. 03/27/23   [provider]  loperamide (  ANTI-DIARRHEAL) 2 MG tablet TAKE 1 TABLET BY MOUTH AS NEEDED FOR DIARRHEA OR LOOSE STOOL**MAX 4 TABLETS IN 24 HOURS** Patient taking differently: Take 2 mg by mouth 4 (four) times daily as needed for diarrhea or loose stools. 12/27/22   Babs Sciara, MD  losartan (COZAAR) 50 MG tablet Take 1 tablet daily 05/06/23   Babs Sciara, MD  Melatonin 5 MG CAPS Take 1 capsule (5 mg total) by mouth at bedtime. 05/12/23   Babs Sciara, MD  metFORMIN (GLUCOPHAGE) 500 MG tablet Take one tablet 500mg  po in the morning and 1/2 tablet 250 mg po at  supper Patient taking differently: Take 750 mg by mouth daily. Take one tablet 500mg  po in the morning and 1/2 tablet 250 mg po at supper 12/02/22   Shon Hale, MD  Multiple Vitamins-Minerals (PRESERVISION AREDS 2) CAPS Take 1 capsule by mouth in the morning and at bedtime.    [provider]  Omega-3 Fatty Acids (FISH OIL) 1000 MG CAPS One capsule BID Patient taking differently: Take 1 capsule by mouth in the morning and at bedtime. 08/07/21   Babs Sciara, MD  pantoprazole (PROTONIX) 40 MG tablet Take 1 tablet (40 mg total) by mouth daily. 05/06/23   Babs Sciara, MD  polyethylene glycol powder (GOODSENSE CLEARLAX) 17 GM/SCOOP powder 1 scoop in 8 ounces water once daily as needed for constipation 06/03/23   Luking, Scott A, MD  RESTASIS 0.05 % ophthalmic emulsion Place 1 drop into both eyes 2 (two) times daily. 04/03/23   [provider]  sertraline (ZOLOFT) 25 MG tablet Take 1 tablet (25 mg total) by mouth daily. 05/06/23   Babs Sciara, MD  Vitamin D, Cholecalciferol, 10 MCG (400 UNIT) TABS TAKE (1) TABLET BY MOUTH ONCE DAILY. 05/09/23   Babs Sciara, MD      Allergies    Hydrocortisone, Lisinopril, Phenergan [promethazine hcl], Statins, Trazodone and nefazodone, and Prednisone    Review of Systems   Review of Systems  Neurological:  Positive for headaches.    Physical Exam Updated Vital Signs BP (!) 189/71 (BP Location: Right Arm)   Pulse 95   Temp 98.2 F (36.8 C) (Oral)   Resp 18   Ht 5\' 8"  (1.727 m)   Wt 74.6 kg   SpO2 100%   BMI 25.01 kg/m  Physical Exam Vitals and nursing note reviewed.  Constitutional:      General: She is not in acute distress.    Appearance: She is not toxic-appearing.  HENT:     Head: Normocephalic and atraumatic.  Eyes:     General: No scleral icterus.    Conjunctiva/sclera: Conjunctivae normal.  Cardiovascular:     Rate and Rhythm: Normal rate and regular rhythm.     Pulses: Normal pulses.     Heart sounds:  Normal heart sounds.  Pulmonary:     Effort: Pulmonary effort is normal. No respiratory distress.     Breath sounds: Normal breath sounds.  Abdominal:     General: Abdomen is flat. Bowel sounds are normal.     Palpations: Abdomen is soft.     Tenderness: There is no abdominal tenderness.  Musculoskeletal:     Right lower leg: No edema.     Left lower leg: No edema.  Skin:    General: Skin is warm and dry.     Capillary Refill: Capillary refill takes less than 2 seconds.     Findings: No lesion.  Neurological:  General: No focal deficit present.     Mental Status: She is alert and oriented to person, place, and time. Mental status is at baseline.     Comments: And is alert oriented answering questions appropriately.  Pupils equal and reactive no nystagmus.  Upper extremity strength equal bilaterally sensation well bilaterally.  Strong radial pulse.  Lower extremity with strength equal bilaterally, sensation equal bilaterally.  No lower extremity edema present.     ED Results / Procedures / Treatments   Labs (all labs ordered are listed, but only abnormal results are displayed) Labs Reviewed  BASIC METABOLIC PANEL  CBC  BRAIN NATRIURETIC PEPTIDE  TROPONIN I (HIGH SENSITIVITY)    EKG EKG Interpretation Date/Time:  Thursday July 24 2023 14:30:09 EST Ventricular Rate:  87 PR Interval:  232 QRS Duration:  84 QT Interval:  392 QTC Calculation: 472 R Axis:   46  Text Interpretation: Sinus rhythm Prolonged PR interval Consider left atrial enlargement Anterior infarct, old Confirmed by Gerhard Munch [phone]) on 07/24/2023 2:55:33 PM  Radiology DG Chest 2 View Result Date: 07/24/2023 CLINICAL DATA:  Chest pain. EXAM: CHEST - 2 VIEW COMPARISON:  04/10/2023. FINDINGS: Mild diffuse pulmonary edema. There are atelectatic changes at the lung bases. Bilateral lung fields are otherwise clear. Bilateral costophrenic angles are clear. Note is made of elevated right hemidiaphragm.  Stable mildly enlarged cardio-mediastinal silhouette. No acute osseous abnormalities. The soft tissues are within normal limits. IMPRESSION: *Findings favor mild congestive heart failure/pulmonary edema. Electronically Signed   By: Jules Schick M.D.   On: 07/24/2023 15:08    Procedures Procedures    Medications Ordered in ED Medications - No data to display  ED Course/ Medical Decision Making/ A&P                                 Medical Decision Making Amount and/or Complexity of Data Reviewed Labs: ordered. Radiology: ordered.  Risk Prescription drug management.   Virginia Crews 89 y.o. presented today for HTN. Working DDx that I considered at this time includes, but not limited to, ACS, GERD, pe, pna, aortic dissection, pneumothorax, MSK path, anemia, esophageal rupture, CHF exacerbation, valvular disorder, myocarditis, pericarditis, endocarditis, pericardial effusion/cardiac tamponade, pulmonary edema, gastritis/PUD, esophagitis.  Review of prior external notes: 06/23/23  Unique Tests and My Interpretation:  EKG: Rate, rhythm, axis, intervals all examined: sinus  Troponin 10 repeat troponin 19, repeat troponin 18 Blood glucose 150 CBC without leukocytosis and no anemia.  BMP without significant electrolyte abnormality normal kidney function.  CT without acute abnormality.  Chest x-ray with mild congestion however normal BNP   Problem List / ED Course / Critical interventions / Medication management  Reporting to emergency room with complaint of hypertension.  She does report a pulsing sensation earlier today which has now resolved.  I did obtain head CT which was within normal limits.  Patient is not endorsing chest pain thus doubt this is ACS.  She does have stable troponins and reassuring EKG as well.  Basic labs are also reassuring.  Patient has normal physical exam with no sign of respiratory distress.  She is hemodynamically stable.  Did give 1 dose of labetalol which  decreased blood pressure appropriately.  I will also give first dose of losartan.  Patient does have follow-up with primary care thus I will opt to not prescribe a new medication.  Patient agrees to this plan and understands. Discussed case with  attending ER provider who reviewed labs, imaging and agrees patient is stable for discharge.  I ordered medication including labetalol  Reevaluation of the patient after these medicines showed that the patient improved Patients vitals assessed. Upon arrival patient is hemodynamically stable.  I have reviewed the patients home medicines and have made adjustments as needed     Plan:  Given dose of IV labetalol here, had appropriate decrease in BP, will give one dose of losartan.  Patient has follow-up appoint with primary care tomorrow for blood pressure recheck and to discuss blood pressure medication changes.  Stable for discharge. Given return precautions.         Final Clinical Impression(s) / ED Diagnoses Final diagnoses:  Hypertension, unspecified type    Rx / DC Orders ED Discharge Orders     None         Reinaldo Raddle 07/24/23 2240    Gerhard Munch, MD 07/25/23 [phone]

## 2023-07-24 NOTE — Telephone Encounter (Signed)
Chief Complaint: Elevated blood pressures  Symptoms: rosey cheeks  Frequency: today  Pertinent Negatives: Patient denies chest pain, numbness, weakness,  Disposition: [] ED /[] Urgent Care (no appt availability in office) / [] Appointment(In office/virtual)/ []  [facility]/ [] Home Care/ [] Refused Recommended Disposition /[] [facility]/ [x]  Follow-up with PCP Additional Notes: Patient is a resident at [facility] long term care and has had 3 elevated blood pressure readings today. The readings were 212/72, 222/77, and 221/88. Patient denies any symptoms but Artemio Aly the house supervisor stated the patient cheeks were a rose color. Patient currently taking Amlodipine 10 MG and Losartan 50 MG daily. Care advice given an [place] requesting recommendations from PCP at this time. Please advice    Reason for Disposition  [1] Systolic BP  >= 200 OR Diastolic >= 120 AND [2] having NO cardiac or neurologic symptoms  Answer Assessment - Initial Assessment Questions 1. BLOOD PRESSURE: "What is the blood pressure?" "Did you take at least two measurements 5 minutes apart?"     212/72, 2nd pressure 222/77, 3rd reading 221/88 2. ONSET: "When did you take your blood pressure?"     Today 3. HOW: "How did you take your blood pressure?" (e.g., automatic home BP monitor, visiting nurse)     By nurse at assist living with automatic Diamap machine  4. HISTORY: "Do you have a history of high blood pressure?"     Yes, hypertension  5. MEDICINES: "Are you taking any medicines for blood pressure?" "Have you missed any doses recently?"     Amlodipine 10 MG daily, Losartan 50 MG daily  6. OTHER SYMPTOMS: "Do you have any symptoms?" (e.g., blurred vision, chest pain, difficulty breathing, headache, weakness)     Rosey cheeks,  Protocols used: Blood Pressure - High-A-AH

## 2023-07-24 NOTE — ED Notes (Signed)
Pt stated she is having a "pounding headache." EDP was notified.

## 2023-07-24 NOTE — Telephone Encounter (Signed)
Patient wanted to go to ER today- Patient was scheduled for ER follow up tomorrow with Toni Amend PA

## 2023-07-24 NOTE — ED Notes (Signed)
EDP notified of increase in bp

## 2023-07-24 NOTE — Telephone Encounter (Signed)
Nurses-given that the blood pressure is that high I would recommend a follow-up office visit on Friday either with myself or with Toni Amend

## 2023-07-25 ENCOUNTER — Encounter: Payer: Self-pay | Admitting: Physician Assistant

## 2023-07-25 ENCOUNTER — Ambulatory Visit (INDEPENDENT_AMBULATORY_CARE_PROVIDER_SITE_OTHER): Payer: Medicare Other | Admitting: Physician Assistant

## 2023-07-25 VITALS — BP 194/64 | HR 81 | Temp 97.2°F | Ht 68.0 in | Wt 168.0 lb

## 2023-07-25 DIAGNOSIS — I10 Essential (primary) hypertension: Secondary | ICD-10-CM | POA: Diagnosis not present

## 2023-07-25 MED ORDER — LOSARTAN POTASSIUM 100 MG PO TABS: 100.0000 mg | ORAL_TABLET | Freq: Every day | ORAL | 2 refills | Status: DC

## 2023-07-25 NOTE — Assessment & Plan Note (Addendum)
204/65, 194/64, 180/80 in office today.  Uncontrolled. Increase losartan from 50mg  daily to 100mg  daily. Continue amlodipine 10mg  daily.  Monitor blood pressures at home. Seek care in the ER for any new headaches, visual changes, or chest pain.   Patient scheduled to follow up with Dr. Lorin Picket next month. Lab work at that time.

## 2023-07-25 NOTE — Progress Notes (Signed)
Established Patient Office Visit  Subjective   Patient ID: [name], female    DOB: 1931-12-23  Age: 89 y.o. MRN: [id number]  Chief Complaint  Patient presents with   Hypertension    ER follow up     HPI Patient presents today for hospital follow up after being seen and evaluated in the ER yesterday for elevated blood pressure readings. Blood pressure yesterday prior to ER visit was approximately 220s/70-80. EKG, chest XR, head CT, and lab work to include troponins and BNP obtained in the ER, all resulting relatively stable. Blood pressure following treatment with 10mg  labetalol, 25mg  losartan, and 0.25 mg xanax measured 175/58 prior to discharge. Patient reports today she is feeling much better. She denies headaches, chest pain, shortness of breath, or visual changes.    Review of Systems  Constitutional:  Negative for chills, fever and weight loss.  Eyes:  Negative for blurred vision and double vision.  Respiratory:  Negative for cough and shortness of breath.   Cardiovascular:  Negative for chest pain and leg swelling.  Gastrointestinal:  Negative for nausea and vomiting.  Musculoskeletal:  Negative for back pain.  Neurological:  Negative for headaches.      Objective:     BP (!) 194/64   Pulse 81   Temp (!) 97.2 F (36.2 C)   Ht 5\' 8"  (1.727 m)   Wt 168 lb (76.2 kg)   SpO2 99%   BMI 25.54 kg/m    Physical Exam Constitutional:      Appearance: Normal appearance. She is normal weight.  HENT:     Head: Normocephalic.     Mouth/Throat:     Mouth: Mucous membranes are moist.     Pharynx: Oropharynx is clear.  Eyes:     Extraocular Movements: Extraocular movements intact.     Conjunctiva/sclera: Conjunctivae normal.  Cardiovascular:     Rate and Rhythm: Normal rate and regular rhythm.     Heart sounds: No murmur heard.    No gallop.  Pulmonary:     Effort: Pulmonary effort is normal.     Breath sounds: No wheezing, rhonchi or rales.  Musculoskeletal:      Right lower leg: No edema.     Left lower leg: No edema.  Skin:    General: Skin is warm and dry.  Neurological:     General: No focal deficit present.     Mental Status: She is alert and oriented to person, place, and time.  Psychiatric:        Mood and Affect: Mood normal.        Behavior: Behavior normal.      No results found for any visits on 07/25/23.  The ASCVD Risk score (Arnett DK, et al., 2019) failed to calculate for the following reasons:   The 2019 ASCVD risk score is only valid for ages 106 to 37    Assessment & Plan:   No follow-ups on file.   Essential hypertension Assessment & Plan: 204/65, 194/64, 180/80 in office today.  Uncontrolled. Increase losartan from 50mg  daily to 100mg  daily. Continue amlodipine 10mg  daily.  Monitor blood pressures at home. Seek care in the ER for any new headaches, visual changes, or chest pain.   Patient scheduled to follow up with Dr. Lorin Picket next month. Lab work at that time.   Orders: -     Losartan Potassium; Take 1 tablet (100 mg total) by mouth daily.  Dispense: 90 tablet; Refill: 2  Toni Amend Jaccob Czaplicki, PA-C

## 2023-07-25 NOTE — Telephone Encounter (Signed)
Patient has appointment this afternoon will recheck blood pressure and potentially adjust medicine

## 2023-07-28 ENCOUNTER — Ambulatory Visit (INDEPENDENT_AMBULATORY_CARE_PROVIDER_SITE_OTHER): Payer: Medicare Other | Admitting: Family Medicine

## 2023-07-28 ENCOUNTER — Other Ambulatory Visit: Payer: Self-pay | Admitting: Internal Medicine

## 2023-07-28 VITALS — BP 160/78 | HR 87 | Temp 97.9°F | Ht 68.0 in | Wt 167.8 lb

## 2023-07-28 DIAGNOSIS — I10 Essential (primary) hypertension: Secondary | ICD-10-CM | POA: Diagnosis not present

## 2023-07-28 MED ORDER — HYDROCHLOROTHIAZIDE 12.5 MG PO CAPS: 12.5000 mg | ORAL_CAPSULE | Freq: Every day | ORAL | 6 refills | Status: DC

## 2023-07-28 NOTE — Progress Notes (Signed)
   Subjective:    Patient ID: [name], female    DOB: 1931-10-23, 89 y.o.   MRN: [id number]  HPI Patient here today because she has had several elevated blood pressures.  She was seen in the ER with elevated blood pressure she was seen by our PA Toni Amend Grooms recently she had her losartan increased earlier today her blood pressure was up she was not having any headache just felt fatigued and tired.  No chest tightness pressure pain.  History of diabetes hypertension   Review of Systems     Objective:   Physical Exam Lungs clear heart regular no crackles extremities no edema       Assessment & Plan:   HTN Patient feeling the effects of elevated blood pressure Our goal is to try to get the blood pressure in the 150s if possible She is on maximum dose of losartan and as well as amlodipine She has lab work coming up this Friday Low-dose HCTZ 12.5 added Recheck in approximately 4 weeks already has appointment Follow-up sooner if problems If possible bring blood pressure monitor from her rest home to [place] for her visit

## 2023-08-01 DIAGNOSIS — E871 Hypo-osmolality and hyponatremia: Secondary | ICD-10-CM | POA: Diagnosis not present

## 2023-08-01 DIAGNOSIS — E876 Hypokalemia: Secondary | ICD-10-CM | POA: Diagnosis not present

## 2023-08-01 DIAGNOSIS — I10 Essential (primary) hypertension: Secondary | ICD-10-CM | POA: Diagnosis not present

## 2023-08-01 DIAGNOSIS — E785 Hyperlipidemia, unspecified: Secondary | ICD-10-CM | POA: Diagnosis not present

## 2023-08-01 DIAGNOSIS — E1169 Type 2 diabetes mellitus with other specified complication: Secondary | ICD-10-CM | POA: Diagnosis not present

## 2023-08-01 DIAGNOSIS — E119 Type 2 diabetes mellitus without complications: Secondary | ICD-10-CM | POA: Diagnosis not present

## 2023-08-02 ENCOUNTER — Encounter: Payer: Self-pay | Admitting: Family Medicine

## 2023-08-02 LAB — BASIC METABOLIC PANEL
BUN/Creatinine Ratio: 23 (ref 12–28)
BUN: 21 mg/dL (ref 8–27)
CO2: 26 mmol/L (ref 20–29)
Calcium: 9.9 mg/dL (ref 8.7–10.3)
Chloride: 87 mmol/L — ABNORMAL LOW (ref 96–106)
Creatinine, Ser: 0.92 mg/dL (ref 0.57–1.00)
Glucose: 122 mg/dL — ABNORMAL HIGH (ref 70–99)
Potassium: 4.6 mmol/L (ref 3.5–5.2)
Sodium: 131 mmol/L — ABNORMAL LOW (ref 134–144)
eGFR: 60 mL/min/{1.73_m2} (ref >59)

## 2023-08-02 LAB — LIPID PANEL
Chol/HDL Ratio: 4.1 {ratio} (ref 0.0–4.4)
Cholesterol, Total: 220 mg/dL — ABNORMAL HIGH (ref 100–199)
HDL: 54 mg/dL (ref >39)
LDL Chol Calc (NIH): 121 mg/dL — ABNORMAL HIGH (ref 0–99)
Triglycerides: 257 mg/dL — ABNORMAL HIGH (ref 0–149)
VLDL Cholesterol Cal: 45 mg/dL — ABNORMAL HIGH (ref 5–40)

## 2023-08-02 LAB — HEMOGLOBIN A1C
Est. average glucose Bld gHb Est-mCnc: 143 mg/dL
Hgb A1c MFr Bld: 6.6 % — ABNORMAL HIGH (ref 4.8–5.6)

## 2023-08-04 DIAGNOSIS — M6281 Muscle weakness (generalized): Secondary | ICD-10-CM | POA: Diagnosis not present

## 2023-08-04 DIAGNOSIS — R278 Other lack of coordination: Secondary | ICD-10-CM | POA: Diagnosis not present

## 2023-08-05 DIAGNOSIS — M6281 Muscle weakness (generalized): Secondary | ICD-10-CM | POA: Diagnosis not present

## 2023-08-06 ENCOUNTER — Telehealth: Payer: Self-pay | Admitting: *Deleted

## 2023-08-06 NOTE — Telephone Encounter (Signed)
 Patient blood pressure was significantly elevated that is why we started a low-dose hydrochlorothiazide   I would recommend office visit with myself or Charmaine in the next 5 to 7 days  Please talk with the daughter if there is any alarming symptoms going on to let us  know When we see the patient we can do blood work as well as recheck blood pressure

## 2023-08-06 NOTE — Telephone Encounter (Signed)
 Copied from CRM [id number]. Topic: Clinical - Medical Advice >> Aug 06, 2023  3:44 PM Antonio H wrote: Reason for CRM: Patient's daughter called because she said every since the patient has started taking hydrochlorothiazide  (started on the 27th of last month), the patient has been feeling weak and not having any energy. Would like a call back discuss what the patient should do. [phone]

## 2023-08-07 ENCOUNTER — Other Ambulatory Visit (HOSPITAL_COMMUNITY)
Admission: RE | Admit: 2023-08-07 | Discharge: 2023-08-07 | Disposition: A | Discharge status: Home / Self Care | Payer: Medicare Other | Source: Ambulatory Visit | Attending: Family Medicine | Admitting: Family Medicine

## 2023-08-07 ENCOUNTER — Encounter: Payer: Self-pay | Admitting: Family Medicine

## 2023-08-07 ENCOUNTER — Other Ambulatory Visit: Payer: Self-pay

## 2023-08-07 DIAGNOSIS — R5383 Other fatigue: Secondary | ICD-10-CM

## 2023-08-07 DIAGNOSIS — Z79899 Other long term (current) drug therapy: Secondary | ICD-10-CM | POA: Diagnosis not present

## 2023-08-07 DIAGNOSIS — M6281 Muscle weakness (generalized): Secondary | ICD-10-CM | POA: Diagnosis not present

## 2023-08-07 LAB — CBC WITH DIFFERENTIAL/PLATELET
Abs Immature Granulocytes: 0.03 10*3/uL (ref 0.00–0.07)
Basophils Absolute: 0 10*3/uL (ref 0.0–0.1)
Basophils Relative: 0 %
Eosinophils Absolute: 0.1 10*3/uL (ref 0.0–0.5)
Eosinophils Relative: 1 %
HCT: 38 % (ref 36.0–46.0)
Hemoglobin: 13 g/dL (ref 12.0–15.0)
Immature Granulocytes: 0 %
Lymphocytes Relative: 21 %
Lymphs Abs: 1.9 10*3/uL (ref 0.7–4.0)
MCH: 29.3 pg (ref 26.0–34.0)
MCHC: 34.2 g/dL (ref 30.0–36.0)
MCV: 85.6 fL (ref 80.0–100.0)
Monocytes Absolute: 0.6 10*3/uL (ref 0.1–1.0)
Monocytes Relative: 7 %
Neutro Abs: 6.3 10*3/uL (ref 1.7–7.7)
Neutrophils Relative %: 71 %
Platelets: 310 10*3/uL (ref 150–400)
RBC: 4.44 MIL/uL (ref 3.87–5.11)
RDW: 13.3 % (ref 11.5–15.5)
WBC: 9 10*3/uL (ref 4.0–10.5)
nRBC: 0 % (ref 0.0–0.2)

## 2023-08-07 LAB — BASIC METABOLIC PANEL
Anion gap: 11 (ref 5–15)
BUN: 22 mg/dL (ref 8–23)
CO2: 30 mmol/L (ref 22–32)
Calcium: 9.9 mg/dL (ref 8.9–10.3)
Chloride: 87 mmol/L — ABNORMAL LOW (ref 98–111)
Creatinine, Ser: 0.8 mg/dL (ref 0.44–1.00)
GFR, Estimated: >60 mL/min (ref >60)
Glucose, Bld: 127 mg/dL — ABNORMAL HIGH (ref 70–99)
Potassium: 4.5 mmol/L (ref 3.5–5.1)
Sodium: 128 mmol/L — ABNORMAL LOW (ref 135–145)

## 2023-08-07 LAB — MAGNESIUM: Magnesium: 1.9 mg/dL (ref 1.7–2.4)

## 2023-08-07 NOTE — Telephone Encounter (Signed)
 Montie from [facility] called in to inform us  the pt has felt drained over the past two days and is wondering if it is being caused by the HTCZ (bp:202/83) , after discussing with Dr Alphonsa he does not think that the medication is causing the fatigue and request STAT labs be put in for pt to do. Pt also scheduled appt for tomorrow 08/08/2023 @11 :30am

## 2023-08-08 ENCOUNTER — Ambulatory Visit (INDEPENDENT_AMBULATORY_CARE_PROVIDER_SITE_OTHER): Payer: Medicare Other | Admitting: Family Medicine

## 2023-08-08 ENCOUNTER — Encounter: Payer: Self-pay | Admitting: Family Medicine

## 2023-08-08 VITALS — BP 161/75 | HR 86 | Temp 97.3°F | Ht 68.0 in | Wt 167.0 lb

## 2023-08-08 DIAGNOSIS — I10 Essential (primary) hypertension: Secondary | ICD-10-CM

## 2023-08-08 DIAGNOSIS — E871 Hypo-osmolality and hyponatremia: Secondary | ICD-10-CM
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
## 2023-08-11 DIAGNOSIS — R278 Other lack of coordination: Secondary | ICD-10-CM | POA: Diagnosis not present

## 2023-08-11 DIAGNOSIS — M6281 Muscle weakness (generalized): Secondary | ICD-10-CM | POA: Diagnosis not present

## 2023-08-12 DIAGNOSIS — M6281 Muscle weakness (generalized): Secondary | ICD-10-CM | POA: Diagnosis not present

## 2023-08-13 ENCOUNTER — Other Ambulatory Visit: Payer: Self-pay | Admitting: Family Medicine

## 2023-08-13 DIAGNOSIS — R278 Other lack of coordination: Secondary | ICD-10-CM | POA: Diagnosis not present

## 2023-08-13 DIAGNOSIS — M6281 Muscle weakness (generalized): Secondary | ICD-10-CM | POA: Diagnosis not present

## 2023-08-13 NOTE — Progress Notes (Signed)
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

## 2023-08-14 ENCOUNTER — Encounter (HOSPITAL_COMMUNITY): Payer: Self-pay

## 2023-08-14 ENCOUNTER — Other Ambulatory Visit: Payer: Self-pay

## 2023-08-14 ENCOUNTER — Emergency Department (HOSPITAL_COMMUNITY)
Admission: EM | Admit: 2023-08-14 | Discharge: 2023-08-14 | Disposition: A | Discharge status: Home / Self Care | Payer: Medicare Other | Attending: Emergency Medicine | Admitting: Emergency Medicine

## 2023-08-14 DIAGNOSIS — Z20822 Contact with and (suspected) exposure to covid-19: Secondary | ICD-10-CM | POA: Diagnosis not present

## 2023-08-14 DIAGNOSIS — Z7984 Long term (current) use of oral hypoglycemic drugs: Secondary | ICD-10-CM | POA: Diagnosis not present

## 2023-08-14 DIAGNOSIS — F331 Major depressive disorder, recurrent, moderate: Secondary | ICD-10-CM | POA: Diagnosis not present

## 2023-08-14 DIAGNOSIS — E119 Type 2 diabetes mellitus without complications: Secondary | ICD-10-CM | POA: Insufficient documentation

## 2023-08-14 DIAGNOSIS — M6281 Muscle weakness (generalized): Secondary | ICD-10-CM | POA: Diagnosis not present

## 2023-08-14 DIAGNOSIS — R531 Weakness: Secondary | ICD-10-CM | POA: Insufficient documentation

## 2023-08-14 DIAGNOSIS — N390 Urinary tract infection, site not specified: Secondary | ICD-10-CM | POA: Insufficient documentation

## 2023-08-14 DIAGNOSIS — I10 Essential (primary) hypertension: Secondary | ICD-10-CM | POA: Diagnosis not present

## 2023-08-14 DIAGNOSIS — Z79899 Other long term (current) drug therapy: Secondary | ICD-10-CM | POA: Insufficient documentation

## 2023-08-14 DIAGNOSIS — Z743 Need for continuous supervision: Secondary | ICD-10-CM | POA: Diagnosis not present

## 2023-08-14 DIAGNOSIS — F5101 Primary insomnia: Secondary | ICD-10-CM | POA: Diagnosis not present

## 2023-08-14 DIAGNOSIS — F419 Anxiety disorder, unspecified: Secondary | ICD-10-CM | POA: Diagnosis not present

## 2023-08-14 LAB — CBC
HCT: 38.3 % (ref 36.0–46.0)
Hemoglobin: 12.7 g/dL (ref 12.0–15.0)
MCH: 29 pg (ref 26.0–34.0)
MCHC: 33.2 g/dL (ref 30.0–36.0)
MCV: 87.4 fL (ref 80.0–100.0)
Platelets: 319 10*3/uL (ref 150–400)
RBC: 4.38 MIL/uL (ref 3.87–5.11)
RDW: 13.6 % (ref 11.5–15.5)
WBC: 7.8 10*3/uL (ref 4.0–10.5)
nRBC: 0 % (ref 0.0–0.2)

## 2023-08-14 LAB — RESP PANEL BY RT-PCR (RSV, FLU A&B, COVID)  RVPGX2
Influenza A by PCR: NEGATIVE
Influenza B by PCR: NEGATIVE
Resp Syncytial Virus by PCR: NEGATIVE
SARS Coronavirus 2 by RT PCR: NEGATIVE

## 2023-08-14 LAB — URINALYSIS, ROUTINE W REFLEX MICROSCOPIC
Bilirubin Urine: NEGATIVE
Glucose, UA: NEGATIVE mg/dL
Hgb urine dipstick: NEGATIVE
Ketones, ur: NEGATIVE mg/dL
Nitrite: NEGATIVE
Protein, ur: 100 mg/dL — AB
Specific Gravity, Urine: 1.006 (ref 1.005–1.030)
WBC, UA: >50 WBC/hpf (ref 0–5)
pH: 7 (ref 5.0–8.0)

## 2023-08-14 LAB — COMPREHENSIVE METABOLIC PANEL
ALT: 16 U/L (ref 0–44)
AST: 19 U/L (ref 15–41)
Albumin: 3.9 g/dL (ref 3.5–5.0)
Alkaline Phosphatase: 88 U/L (ref 38–126)
Anion gap: 9 (ref 5–15)
BUN: 17 mg/dL (ref 8–23)
CO2: 28 mmol/L (ref 22–32)
Calcium: 9.5 mg/dL (ref 8.9–10.3)
Chloride: 97 mmol/L — ABNORMAL LOW (ref 98–111)
Creatinine, Ser: 0.72 mg/dL (ref 0.44–1.00)
GFR, Estimated: >60 mL/min (ref >60)
Glucose, Bld: 115 mg/dL — ABNORMAL HIGH (ref 70–99)
Potassium: 4.4 mmol/L (ref 3.5–5.1)
Sodium: 134 mmol/L — ABNORMAL LOW (ref 135–145)
Total Bilirubin: 0.6 mg/dL (ref 0.0–1.2)
Total Protein: 6.8 g/dL (ref 6.5–8.1)

## 2023-08-14 LAB — TROPONIN I (HIGH SENSITIVITY): Troponin I (High Sensitivity): 8 ng/L (ref <18)

## 2023-08-14 MED ORDER — CEPHALEXIN 500 MG PO CAPS: 500.0000 mg | ORAL_CAPSULE | Freq: Two times a day (BID) | ORAL | 0 refills | Status: AC

## 2023-08-14 MED ORDER — SPIRONOLACTONE 25 MG PO TABS: 12.5000 mg | ORAL_TABLET | Freq: Every day | ORAL | 2 refills | Status: DC

## 2023-08-14 NOTE — Discharge Instructions (Addendum)
You were seen for your elevated blood pressure in the emergency department.   At home, please take the spironolactone for your blood pressure.  Please also take the Keflex in case you have a UTI.    Check your MyChart online for the results of any tests that had not resulted by the time you left the emergency department.   Follow-up with your primary doctor in 2-3 days regarding your visit.    Return immediately to the emergency department if you experience any of the following: Worsening fatigue, fevers, flank pain, or any other concerning symptoms.    Thank you for visiting our Emergency Department. It was a pleasure taking care of you today.

## 2023-08-14 NOTE — ED Provider Notes (Signed)
[place] EMERGENCY DEPARTMENT AT [facility] Provider Note   CSN: [id number] Arrival date & time: 08/14/23  1507     History {Add pertinent medical, surgical, social history, OB history to HPI:1} Chief Complaint  Patient presents with   Hypertension    [name] is a 89 y.o. female.  89 year old female with a history of hypertension, hyperlipidemia, and diabetes who presents to the emergency department due to elevated blood pressure.  Patient reports that this morning she was feeling generally weak.  Was concerned that her blood pressure may be high.  Checked it and it was 202 systolic.  Decided to come into the emergency department.  Says that she was feeling well prior to this.  Says that a resident at her facility does have a viral illness.  She denies any chest pain or shortness of breath.  No severe headache.  No weakness or numbness of her arms or legs.  Was on hydrochlorothiazide but was taken off 2 weeks ago because it made her feel poorly.       Home Medications Prior to Admission medications   Medication Sig Start Date End Date Taking? Authorizing Provider  acetaminophen (TYLENOL) 325 MG tablet TAKE (2) TABLETS BY MOUTH EVERY SIX HOURS AS NEEDED FOR PAIN. MAX 3GM IN 24 HOURS. 07/28/23   Babs Sciara, MD  ALPRAZolam (XANAX) 0.25 MG tablet TAKE (1) TABLET BY MOUTH AT BEDTIME AS NEEDED FOR SLEEP. 08/13/23   Babs Sciara, MD  amLODipine (NORVASC) 10 MG tablet Take 1 tablet (10 mg total) by mouth daily. 12/02/22   Emokpae, Courage, MD  benzonatate (TESSALON) 100 MG capsule TAKE (1) CAPSULE BY MOUTH 3 TIMES DAILY AS NEEDED FOR COUGH. 11/21/22   Babs Sciara, MD  BETA CAROTENE PROVITAMIN A 10272 units capsule Take 25,000 Units by mouth daily. 01/01/23   [provider]  BISACODYL 5 MG EC tablet TAKE 1 TABLET BY MOUTH ONCE DAILY AS NEEDED FOR MODERATE CONSTIPATION. Patient taking differently: Take 5 mg by mouth daily as needed for moderate constipation.  12/20/22   Babs Sciara, MD  cyanocobalamin 1000 MCG tablet Take 1,000 mcg by mouth at bedtime.    [provider]  dicyclomine (BENTYL) 10 MG capsule TAKE 1 CAPSULE BY MOUTH TWICE DAILY AS NEEDED FOR ABDOMINAL CRAMPS AND LOOSE STOOL. Patient taking differently: Take 10 mg by mouth 2 (two) times daily as needed (abdominal cramps and loose stools.). TAKE 1 CAPSULE BY MOUTH TWICE DAILY AS NEEDED FOR ABDOMINAL CRAMPS AND LOOSE STOOL. 11/21/22   Babs Sciara, MD  EASYMAX TEST test strip CHECK BLOOD SUGAR ONCE DAILY.(CALL MD IF BS BELOW 60: OR IF BS ABOVE 400) 07/21/23   Luking, [name], MD  gabapentin (NEURONTIN) 100 MG capsule 2 po bid Patient taking differently: Take 200 mg by mouth 2 (two) times daily. 11/19/22   Babs Sciara, MD  GOODSENSE HEMORRHOIDAL 0.25-14-74.9 % rectal ointment Place 1 Application rectally 3 (three) times daily as needed for irritation. 12/26/21   [provider]  hydrochlorothiazide (MICROZIDE) 12.5 MG capsule Take 1 capsule (12.5 mg total) by mouth daily. 07/28/23   Babs Sciara, MD  Iron, Ferrous Sulfate, 325 (65 Fe) MG TABS Take 325 mg by mouth every other day. 02/20/23   Babs Sciara, MD  IVIZIA DRY EYES 0.5 % SOLN Place 1 drop into both eyes in the morning, at noon, in the evening, and at bedtime. 03/27/23   [provider]  loperamide (  ANTI-DIARRHEAL) 2 MG tablet TAKE 1 TABLET BY MOUTH AS NEEDED FOR DIARRHEA OR LOOSE STOOL**MAX 4 TABLETS IN 24 HOURS** Patient taking differently: Take 2 mg by mouth 4 (four) times daily as needed for diarrhea or loose stools. 12/27/22   Babs Sciara, MD  losartan (COZAAR) 100 MG tablet Take 1 tablet (100 mg total) by mouth daily. 07/25/23   Grooms, Courtney, PA-C  Melatonin 5 MG CAPS Take 1 capsule (5 mg total) by mouth at bedtime. 05/12/23   Babs Sciara, MD  metFORMIN (GLUCOPHAGE) 500 MG tablet Take one tablet 500mg  po in the morning and 1/2 tablet 250 mg po at supper Patient taking differently: Take  750 mg by mouth daily. Take one tablet 500mg  po in the morning and 1/2 tablet 250 mg po at supper 12/02/22   Shon Hale, MD  Multiple Vitamins-Minerals (PRESERVISION AREDS 2) CAPS Take 1 capsule by mouth in the morning and at bedtime.    [provider]  Omega-3 Fatty Acids (FISH OIL) 1000 MG CAPS One capsule BID Patient taking differently: Take 1 capsule by mouth in the morning and at bedtime. 08/07/21   Babs Sciara, MD  pantoprazole (PROTONIX) 40 MG tablet Take 1 tablet (40 mg total) by mouth daily. 05/06/23   Babs Sciara, MD  polyethylene glycol powder (GOODSENSE CLEARLAX) 17 GM/SCOOP powder 1 scoop in 8 ounces water once daily as needed for constipation 06/03/23   Luking, Scott A, MD  RESTASIS 0.05 % ophthalmic emulsion Place 1 drop into both eyes 2 (two) times daily. 04/03/23   [provider]  sertraline (ZOLOFT) 25 MG tablet Take 1 tablet (25 mg total) by mouth daily. 05/06/23   Babs Sciara, MD  Vitamin D, Cholecalciferol, 10 MCG (400 UNIT) TABS TAKE (1) TABLET BY MOUTH ONCE DAILY. 05/09/23   Babs Sciara, MD      Allergies    Hydrocortisone, Lisinopril, Phenergan [promethazine hcl], Statins, Trazodone and nefazodone, and Prednisone    Review of Systems   Review of Systems  Physical Exam Updated Vital Signs BP (!) 174/62 (BP Location: Left Arm)   Pulse 80   Temp 97.8 F (36.6 C) (Oral)   Resp 18   Ht 5\' 8"  (1.727 m)   Wt 75.8 kg   SpO2 98%   BMI 25.39 kg/m  Physical Exam Vitals and nursing note reviewed.  Constitutional:      General: She is not in acute distress.    Appearance: She is well-developed.  HENT:     Head: Normocephalic and atraumatic.     Right Ear: External ear normal.     Left Ear: External ear normal.     Nose: Nose normal.  Eyes:     Extraocular Movements: Extraocular movements intact.     Conjunctiva/sclera: Conjunctivae normal.     Pupils: Pupils are equal, round, and reactive to light.  Cardiovascular:     Rate and  Rhythm: Normal rate and regular rhythm.     Heart sounds: No murmur heard. Pulmonary:     Effort: Pulmonary effort is normal. No respiratory distress.     Breath sounds: Normal breath sounds.  Musculoskeletal:     Cervical back: Normal range of motion and neck supple.     Right lower leg: No edema.     Left lower leg: No edema.  Skin:    General: Skin is warm and dry.  Neurological:     Mental Status: She is alert and oriented to person, place,  and time. Mental status is at baseline.     Cranial Nerves: No cranial nerve deficit.     Sensory: No sensory deficit.     Motor: No weakness.  Psychiatric:        Mood and Affect: Mood normal.     ED Results / Procedures / Treatments   Labs (all labs ordered are listed, but only abnormal results are displayed) Labs Reviewed - No data to display  EKG None  Radiology No results found.  Procedures Procedures  {Document cardiac monitor, telemetry assessment procedure when appropriate:1}  Medications Ordered in ED Medications - No data to display  ED Course/ Medical Decision Making/ A&P   {   Click here for ABCD2, HEART and other calculatorsREFRESH Note before signing :1}                              Medical Decision Making Amount and/or Complexity of Data Reviewed Labs: ordered.   ***  {Document critical care time when appropriate:1} {Document review of labs and clinical decision tools ie heart score, Chads2Vasc2 etc:1}  {Document your independent review of radiology images, and any outside records:1} {Document your discussion with family members, caretakers, and with consultants:1} {Document social determinants of health affecting pt's care:1} {Document your decision making why or why not admission, treatments were needed:1} Final Clinical Impression(s) / ED Diagnoses Final diagnoses:  None    Rx / DC Orders ED Discharge Orders     None

## 2023-08-14 NOTE — ED Triage Notes (Signed)
Pt reports the doctor has been "tweaking my BP meds for the past 2 months" and she had elevated BP this afternoon.

## 2023-08-16 ENCOUNTER — Telehealth: Payer: Self-pay | Admitting: Family Medicine

## 2023-08-16 NOTE — Telephone Encounter (Signed)
Staff/nurses Please touch base with [facility]  Recently patient was seen in the ER for elevated blood pressure and feeling bad.  Please remind the facility to do her metabolic 7 a few days before her follow-up visit We will see her as scheduled later this month thanks

## 2023-08-17 LAB — URINE CULTURE: Culture: 60000 — AB

## 2023-08-18 ENCOUNTER — Telehealth (HOSPITAL_BASED_OUTPATIENT_CLINIC_OR_DEPARTMENT_OTHER): Payer: Self-pay | Admitting: *Deleted

## 2023-08-18 ENCOUNTER — Other Ambulatory Visit: Payer: Self-pay

## 2023-08-18 DIAGNOSIS — R278 Other lack of coordination: Secondary | ICD-10-CM | POA: Diagnosis not present

## 2023-08-18 DIAGNOSIS — N390 Urinary tract infection, site not specified: Secondary | ICD-10-CM

## 2023-08-18 DIAGNOSIS — M6281 Muscle weakness (generalized): Secondary | ICD-10-CM | POA: Diagnosis not present

## 2023-08-18 MED ORDER — NITROFURANTOIN MONOHYD MACRO 100 MG PO CAPS: 100.0000 mg | ORAL_CAPSULE | Freq: Two times a day (BID) | ORAL | 0 refills | Status: AC

## 2023-08-18 NOTE — Telephone Encounter (Signed)
Spoke with clinical staff and informed regarding antibiotics and new prescription sent to pharmacy as prescribed per provider. They stated she used BP cuffs from the facility .

## 2023-08-18 NOTE — Telephone Encounter (Signed)
Actually her culture came back it does have some bacteria that is resistant Stop Keflex Recommend Macrobid 100 mg 1 twice daily for 5 days This is generally a very well-tolerated medicine I would encourage patient to take it She has a follow-up visit next week If possible bring bring blood pressure cuff from her rest home with her so we can double check the validity of some of the readings she is getting at the rest home (Apparently her blood pressures are sky high at rest at home but not as bad at the ER when she went, obviously we will recheck her blood pressure here but it would be nice to check her machine as well thank you)

## 2023-08-18 NOTE — Telephone Encounter (Signed)
Spoke with [name] from [facility], she states the hospital put the pt on Keflex twice a day for UTI, pt is not having any UTI symptoms and the Keflex twice a day is making her feel bad, [name] wants to know since there is no symptoms if it is okay to stop Keflex

## 2023-08-18 NOTE — Telephone Encounter (Signed)
Post ED Visit - Positive Culture Follow-up: Successful Patient Follow-Up  Culture assessed and recommendations reviewed by:  [x]  Ivery Quale, Pharm.D. []  Celedonio Miyamoto, Pharm.D., BCPS AQ-ID []  Garvin Fila, Pharm.D., BCPS []  Georgina Pillion, Pharm.D., BCPS []  [place], [address].D., BCPS, AAHIVP []  Estella Husk, Pharm.D., BCPS, AAHIVP []  Lysle Pearl, PharmD, BCPS []  Phillips Climes, PharmD, BCPS []  Agapito Games, PharmD, BCPS []  Verlan Friends, PharmD  Positive urine culture  []  Patient discharged without antimicrobial prescription and treatment is now indicated [x]  Organism is resistant to prescribed ED discharge antimicrobial []  Patient with positive blood cultures  Changes discussed with ED provider: Amjad New antibiotic prescription Macrobid 100 mg x 5 days  Faxed to [facility] facility. Called and fax over copy of cx report and recommendation to [phone]   Contacted patient, date 2/17/2/, time 1130   Bing Quarry 08/18/2023, 11:55 AM

## 2023-08-18 NOTE — Telephone Encounter (Unsigned)
Copied from CRM [id number]. Topic: Clinical - Medication Question >> Aug 18, 2023  2:40 PM Kristie Cowman wrote: Patient representative calling regarding Keflex 500 mg twice a day.  Patient feels like it is too high of a dose for her.  Facility requesting med to be switched to Macrobid 100 mg twice a day for 5 days.  Facility would like to speak to a nurse regarding this.    [phone] [name]

## 2023-08-19 DIAGNOSIS — M6281 Muscle weakness (generalized): Secondary | ICD-10-CM | POA: Diagnosis not present

## 2023-08-21 DIAGNOSIS — M6281 Muscle weakness (generalized): Secondary | ICD-10-CM | POA: Diagnosis not present

## 2023-08-22 DIAGNOSIS — E871 Hypo-osmolality and hyponatremia: Secondary | ICD-10-CM | POA: Diagnosis not present

## 2023-08-23 ENCOUNTER — Encounter: Payer: Self-pay | Admitting: Family Medicine

## 2023-08-23 LAB — BASIC METABOLIC PANEL
BUN/Creatinine Ratio: 26 (ref 12–28)
BUN: 21 mg/dL (ref 8–27)
CO2: 27 mmol/L (ref 20–29)
Calcium: 10.2 mg/dL (ref 8.7–10.3)
Chloride: 94 mmol/L — ABNORMAL LOW (ref 96–106)
Creatinine, Ser: 0.8 mg/dL (ref 0.57–1.00)
Glucose: 134 mg/dL — ABNORMAL HIGH (ref 70–99)
Potassium: 5.1 mmol/L (ref 3.5–5.2)
Sodium: 136 mmol/L (ref 134–144)
eGFR: 70 mL/min/{1.73_m2} (ref >59)

## 2023-08-25 ENCOUNTER — Ambulatory Visit (INDEPENDENT_AMBULATORY_CARE_PROVIDER_SITE_OTHER): Payer: Medicare Other | Admitting: Family Medicine

## 2023-08-25 ENCOUNTER — Other Ambulatory Visit: Payer: Self-pay | Admitting: Family Medicine

## 2023-08-25 ENCOUNTER — Telehealth: Payer: Self-pay | Admitting: Family Medicine

## 2023-08-25 ENCOUNTER — Encounter: Payer: Self-pay | Admitting: Family Medicine

## 2023-08-25 VITALS — BP 164/78 | HR 89 | Temp 97.9°F | Ht 68.0 in | Wt 166.0 lb

## 2023-08-25 DIAGNOSIS — R5383 Other fatigue: Secondary | ICD-10-CM

## 2023-08-25 DIAGNOSIS — N390 Urinary tract infection, site not specified: Secondary | ICD-10-CM

## 2023-08-25 LAB — POCT URINALYSIS DIP (CLINITEK)
Bilirubin, UA: NEGATIVE
Glucose, UA: NEGATIVE mg/dL
Ketones, POC UA: NEGATIVE mg/dL
Nitrite, UA: NEGATIVE
POC PROTEIN,UA: 30 — AB
Spec Grav, UA: 1.01 (ref 1.010–1.025)
Urobilinogen, UA: 0.2 U/dL
pH, UA: 6.5 (ref 5.0–8.0)

## 2023-08-25 MED ORDER — VALSARTAN 160 MG PO TABS: 160.0000 mg | ORAL_TABLET | Freq: Every day | ORAL | 5 refills | Status: DC

## 2023-08-25 MED ORDER — GABAPENTIN 100 MG PO CAPS: ORAL_CAPSULE | ORAL | 6 refills | Status: DC

## 2023-08-25 NOTE — Progress Notes (Signed)
   Subjective:    Patient ID: [name], female    DOB: 12/17/1931, 89 y.o.   MRN: [id number]  Discussed the use of AI scribe software for clinical note transcription with the patient, who gave verbal consent to proceed.  History of Present Illness   [name] is an 89 year old female who presents with fatigue and elevated blood pressure.  She experiences extreme fatigue, describing herself as 'wiped out completely' and unable to function, having spent the previous day in bed. There is a significant decrease in her ability to walk, feeling unstable even with a walker, and she fears falling due to weakness and lack of energy. Her mood is low, feeling 'tired of being tired,' but she enjoyed a visit from a cousin who brought birthday gifts and stayed for two hours. She discusses her sister-in-law's recent move and the loss of her sister-in-law's husband, which has affected her mood.  She has a history of elevated blood pressure, which remains high despite medication adjustments. Previously on hydrochlorothiazide, which caused her to feel 'out of herself,' she is now on spironolactone. She describes a sensation of a pulse or beat in her head when her blood pressure is elevated, with readings around 164/78 and 168/70. The current regimen includes losartan, spironolactone, and amlodipine.  She was treated for a urinary tract infection with Starlix, later switched to nitrofurantoin due to bacterial resistance to cefalexin. Her urine dipstick showed a little protein, and further analysis is pending.  Her appetite is diminished; she ate oatmeal but did not touch bacon and only had a bite of eggs for breakfast. She drank a cup of coffee and a glass of water. She sometimes feels like she is not breathing deeply enough and occasionally takes deep breaths to compensate.     Lab work from Friday was reviewed potassium looks good kidney functions look good    Review of Systems     Objective:     Physical Exam   VITALS: BP- 164/78     General-in no acute distress Eyes-no discharge Lungs-respiratory rate normal, CTA CV-no murmurs,RRR Extremities skin warm dry no edema Neuro grossly normal Behavior normal, alert       Assessment & Plan:  Assessment and Plan    Hypertension Elevated blood pressure readings. Patient reports feeling a throbbing sensation in her head. Currently on Losartan 100mg  daily and Spironolactone. Discussed changing medication regimen due to persistent hypertension. -Discontinue Losartan and Spironolactone. -Start Valsartan 160mg  daily.  Fatigue Patient reports severe fatigue, lack of energy, and decreased strength. Currently on Gabapentin, which may be contributing to symptoms. -Wean Gabapentin over the next 10 days:1 in the morning, 1 in the evening for 5 days, then 1 in the evening for 5 days, then discontinue.  Urinary Tract Infection Patient was recently treated with Cefalexin, but urine culture showed resistance. Switched to Nitrofurantoin. -Continue Nitrofurantoin as per sensitivity results.  Follow-up in 2 weeks to reassess blood pressure control and fatigue.      Given the elevated blood pressure but also her current medications will switch away from spironolactone we will increase ARB by switching from losartan to valsartan and then doing a follow-up blood pressure within 2 weeks Significant time spent with patient evaluating her situation reading over ER notes and taking into account recent lab work and her current symptoms

## 2023-08-25 NOTE — Telephone Encounter (Signed)
 Updated prescription was [facility] to [facility] regarding 1 twice daily for 5 days and 1 taken once daily for 5 days then stop

## 2023-08-25 NOTE — Telephone Encounter (Signed)
 Copied from CRM [id number]. Topic: Clinical - Prescription Issue >> Aug 25, 2023  3:20 PM Kristie Cowman wrote: Reason for CRM: Patient on Gabapentin - Pharmacy calling because Dr. Gerda Diss did not put a weaning dose on the prescription order.  Pharmacy needs to verify this information for this patient.    [facility] [phone]).

## 2023-08-27 ENCOUNTER — Ambulatory Visit: Payer: Medicare Other | Admitting: Family Medicine

## 2023-08-28 LAB — URINE CULTURE

## 2023-08-28 LAB — SPECIMEN STATUS REPORT

## 2023-08-28 NOTE — Progress Notes (Signed)
 To assess for infection

## 2023-08-29 ENCOUNTER — Telehealth: Payer: Self-pay | Admitting: Family Medicine

## 2023-08-29 ENCOUNTER — Other Ambulatory Visit: Payer: Self-pay

## 2023-08-29 DIAGNOSIS — N390 Urinary tract infection, site not specified: Secondary | ICD-10-CM

## 2023-08-29 MED ORDER — SULFAMETHOXAZOLE-TRIMETHOPRIM 800-160 MG PO TABS: 1.0000 | ORAL_TABLET | Freq: Two times a day (BID) | ORAL | 0 refills | Status: AC

## 2023-08-29 NOTE — Telephone Encounter (Signed)
 Nurses Please see result note Urine culture shows infection It would be fine to talk with [facility] medical attendant.  Plus also called in the prescription.  Thanks-Dr. Lorin Picket  (FYI this is her 89th birthday today)

## 2023-08-29 NOTE — Telephone Encounter (Signed)
 [facility] medical staff has been contacted and provided information per notes.

## 2023-09-01 DIAGNOSIS — R278 Other lack of coordination: Secondary | ICD-10-CM | POA: Diagnosis not present

## 2023-09-01 DIAGNOSIS — M6281 Muscle weakness (generalized): Secondary | ICD-10-CM | POA: Diagnosis not present

## 2023-09-02 DIAGNOSIS — M6281 Muscle weakness (generalized): Secondary | ICD-10-CM | POA: Diagnosis not present

## 2023-09-03 ENCOUNTER — Other Ambulatory Visit: Payer: Self-pay | Admitting: Family Medicine

## 2023-09-04 DIAGNOSIS — M6281 Muscle weakness (generalized): Secondary | ICD-10-CM | POA: Diagnosis not present

## 2023-09-05 ENCOUNTER — Other Ambulatory Visit: Payer: Self-pay

## 2023-09-05 ENCOUNTER — Encounter (HOSPITAL_COMMUNITY): Payer: Self-pay

## 2023-09-05 ENCOUNTER — Emergency Department (HOSPITAL_COMMUNITY)
Admission: EM | Admit: 2023-09-05 | Discharge: 2023-09-05 | Disposition: A | Discharge status: Home / Self Care | Attending: Emergency Medicine | Admitting: Emergency Medicine

## 2023-09-05 DIAGNOSIS — R000 Tachycardia, unspecified: Secondary | ICD-10-CM | POA: Diagnosis not present

## 2023-09-05 DIAGNOSIS — J101 Influenza due to other identified influenza virus with other respiratory manifestations: Secondary | ICD-10-CM

## 2023-09-05 DIAGNOSIS — Z7984 Long term (current) use of oral hypoglycemic drugs: Secondary | ICD-10-CM | POA: Diagnosis not present

## 2023-09-05 DIAGNOSIS — I10 Essential (primary) hypertension: Secondary | ICD-10-CM | POA: Insufficient documentation

## 2023-09-05 DIAGNOSIS — E119 Type 2 diabetes mellitus without complications: Secondary | ICD-10-CM | POA: Insufficient documentation

## 2023-09-05 DIAGNOSIS — Z79899 Other long term (current) drug therapy: Secondary | ICD-10-CM | POA: Diagnosis not present

## 2023-09-05 DIAGNOSIS — R070 Pain in throat: Secondary | ICD-10-CM | POA: Diagnosis not present

## 2023-09-05 DIAGNOSIS — R531 Weakness: Secondary | ICD-10-CM | POA: Diagnosis not present

## 2023-09-05 DIAGNOSIS — R509 Fever, unspecified: Secondary | ICD-10-CM | POA: Diagnosis not present

## 2023-09-05 LAB — RESP PANEL BY RT-PCR (RSV, FLU A&B, COVID)  RVPGX2
Influenza A by PCR: POSITIVE — AB
Influenza B by PCR: NEGATIVE
Resp Syncytial Virus by PCR: NEGATIVE
SARS Coronavirus 2 by RT PCR: NEGATIVE

## 2023-09-05 LAB — CBG MONITORING, ED: Glucose-Capillary: 150 mg/dL — ABNORMAL HIGH (ref 70–99)

## 2023-09-05 MED ORDER — ONDANSETRON 4 MG PO TBDP: ORAL_TABLET | ORAL | 0 refills | Status: DC

## 2023-09-05 MED ORDER — OSELTAMIVIR PHOSPHATE 75 MG PO CAPS
75.0000 mg | ORAL_CAPSULE | Freq: Once | ORAL | Status: AC
  Administered 2023-09-05: 75 mg via ORAL
  Filled 2023-09-05: qty 1

## 2023-09-05 MED ORDER — ONDANSETRON HCL 4 MG/2ML IJ SOLN: 4.0000 mg | Freq: Once | INTRAMUSCULAR | Status: DC

## 2023-09-05 MED ORDER — ACETAMINOPHEN 325 MG PO TABS
650.0000 mg | ORAL_TABLET | Freq: Once | ORAL | Status: AC
  Administered 2023-09-05: 650 mg via ORAL
  Filled 2023-09-05: qty 2

## 2023-09-05 MED ORDER — ONDANSETRON 4 MG PO TBDP
4.0000 mg | ORAL_TABLET | Freq: Once | ORAL | Status: AC
  Administered 2023-09-05: 4 mg via ORAL
  Filled 2023-09-05: qty 1

## 2023-09-05 MED ORDER — OSELTAMIVIR PHOSPHATE 75 MG PO CAPS: 75.0000 mg | ORAL_CAPSULE | Freq: Two times a day (BID) | ORAL | 0 refills | Status: DC

## 2023-09-05 NOTE — ED Triage Notes (Signed)
 Has not had any morning meds per facility.

## 2023-09-05 NOTE — ED Triage Notes (Signed)
 [facility] from [facility]. Cc of fever and weakness. Per ems there is a flu out break. Nursing home did not give anything for a fever of 100.

## 2023-09-05 NOTE — ED Provider Notes (Signed)
 [place] EMERGENCY DEPARTMENT AT [facility] Provider Note   CSN: [id number] Arrival date & time: 09/05/23  4098     History  Chief Complaint  Patient presents with   Fever    Flu outbreak at facility.     [name] is a 89 y.o. female.  Patient with hypertension and diabetes.  She stays at a nursing home and states there is been a lot of fluid she complains of a sore throat and nausea  The history is provided by the patient and medical records. No language interpreter was used.  Fever Temp source:  Oral Severity:  Mild Onset quality:  Sudden Timing:  Constant Progression:  Waxing and waning Chronicity:  New Relieved by:  None tried Worsened by:  Nothing Associated symptoms: no chest pain, no congestion, no cough, no diarrhea, no headaches and no rash        Home Medications Prior to Admission medications   Medication Sig Start Date End Date Taking? Authorizing Provider  ondansetron (ZOFRAN-ODT) 4 MG disintegrating tablet 4mg  ODT q4 hours prn nausea/vomit 09/05/23  Yes Bethann Berkshire, MD  oseltamivir (TAMIFLU) 75 MG capsule Take 1 capsule (75 mg total) by mouth every 12 (twelve) hours. 09/05/23  Yes Bethann Berkshire, MD  acetaminophen (TYLENOL) 325 MG tablet TAKE (2) TABLETS BY MOUTH EVERY SIX HOURS AS NEEDED FOR PAIN. MAX 3GM IN 24 HOURS. 07/28/23   Babs Sciara, MD  ALPRAZolam (XANAX) 0.25 MG tablet TAKE (1) TABLET BY MOUTH AT BEDTIME AS NEEDED FOR SLEEP. 08/13/23   Babs Sciara, MD  amLODipine (NORVASC) 10 MG tablet Take 1 tablet (10 mg total) by mouth daily. 12/02/22   Emokpae, Courage, MD  benzonatate (TESSALON) 100 MG capsule TAKE (1) CAPSULE BY MOUTH 3 TIMES DAILY AS NEEDED FOR COUGH. 11/21/22   Babs Sciara, MD  BETA CAROTENE PROVITAMIN A 11914 units capsule Take 25,000 Units by mouth daily. 01/01/23   [provider]  BISACODYL 5 MG EC tablet TAKE 1 TABLET BY MOUTH ONCE DAILY AS NEEDED FOR MODERATE CONSTIPATION. Patient taking differently:  Take 5 mg by mouth daily as needed for moderate constipation. 12/20/22   Babs Sciara, MD  cyanocobalamin 1000 MCG tablet Take 1,000 mcg by mouth at bedtime.    [provider]  dicyclomine (BENTYL) 10 MG capsule TAKE 1 CAPSULE BY MOUTH TWICE DAILY AS NEEDED FOR ABDOMINAL CRAMPS AND LOOSE STOOL. Patient taking differently: Take 10 mg by mouth 2 (two) times daily as needed (abdominal cramps and loose stools.). TAKE 1 CAPSULE BY MOUTH TWICE DAILY AS NEEDED FOR ABDOMINAL CRAMPS AND LOOSE STOOL. 11/21/22   Babs Sciara, MD  EASYMAX TEST test strip CHECK BLOOD SUGAR ONCE DAILY.(CALL MD IF BS BELOW 60: OR IF BS ABOVE 400) 07/21/23   Babs Sciara, MD  gabapentin (NEURONTIN) 100 MG capsule 1 twice daily for 5 days then 1 daily for 5 days then stop 08/25/23   Luking, [name], MD  GOODSENSE HEMORRHOIDAL 0.25-14-74.9 % rectal ointment Place 1 Application rectally 3 (three) times daily as needed for irritation. 12/26/21   [provider]  Iron, Ferrous Sulfate, 325 (65 Fe) MG TABS Take 325 mg by mouth every other day. 02/20/23   Babs Sciara, MD  IVIZIA DRY EYES 0.5 % SOLN Place 1 drop into both eyes in the morning, at noon, in the evening, and at bedtime. 03/27/23   [provider]  loperamide (ANTI-DIARRHEAL) 2 MG tablet TAKE 1 TABLET BY  MOUTH AS NEEDED FOR DIARRHEA OR LOOSE STOOL**MAX 4 TABLETS IN 24 HOURS** Patient taking differently: Take 2 mg by mouth 4 (four) times daily as needed for diarrhea or loose stools. 12/27/22   Babs Sciara, MD  Melatonin 5 MG CAPS Take 1 capsule (5 mg total) by mouth at bedtime. 05/12/23   Babs Sciara, MD  metFORMIN (GLUCOPHAGE) 500 MG tablet Take one tablet 500mg  po in the morning and 1/2 tablet 250 mg po at supper Patient taking differently: Take 750 mg by mouth daily. Take one tablet 500mg  po in the morning and 1/2 tablet 250 mg po at supper 12/02/22   Shon Hale, MD  Multiple Vitamins-Minerals (PRESERVISION AREDS 2) CAPS Take 1  capsule by mouth in the morning and at bedtime.    [provider]  Omega-3 Fatty Acids (FISH OIL) 1000 MG CAPS One capsule BID Patient taking differently: Take 1 capsule by mouth in the morning and at bedtime. 08/07/21   Babs Sciara, MD  pantoprazole (PROTONIX) 40 MG tablet Take 1 tablet (40 mg total) by mouth daily. 05/06/23   Babs Sciara, MD  polyethylene glycol powder (GOODSENSE CLEARLAX) 17 GM/SCOOP powder 1 scoop in 8 ounces water once daily as needed for constipation 06/03/23   Luking, Scott A, MD  RESTASIS 0.05 % ophthalmic emulsion Place 1 drop into both eyes 2 (two) times daily. 04/03/23   [provider]  sertraline (ZOLOFT) 25 MG tablet Take 1 tablet (25 mg total) by mouth daily. 05/06/23   Babs Sciara, MD  valsartan (DIOVAN) 160 MG tablet Take 1 tablet (160 mg total) by mouth daily. 08/25/23   Babs Sciara, MD  Vitamin D, Cholecalciferol, 10 MCG (400 UNIT) TABS TAKE (1) TABLET BY MOUTH ONCE DAILY. 05/09/23   Babs Sciara, MD      Allergies    Hydrocortisone, Lisinopril, Phenergan [promethazine hcl], Statins, Trazodone and nefazodone, and Prednisone    Review of Systems   Review of Systems  Constitutional:  Positive for fever. Negative for appetite change and fatigue.  HENT:  Negative for congestion, ear discharge and sinus pressure.   Eyes:  Negative for discharge.  Respiratory:  Negative for cough.   Cardiovascular:  Negative for chest pain.  Gastrointestinal:  Negative for abdominal pain and diarrhea.  Genitourinary:  Negative for frequency and hematuria.  Musculoskeletal:  Negative for back pain.  Skin:  Negative for rash.  Neurological:  Negative for seizures and headaches.  Psychiatric/Behavioral:  Negative for hallucinations.     Physical Exam Updated Vital Signs BP (!) 177/68   Pulse (!) 113   Temp 99.6 F (37.6 C) (Oral)   Resp 18   Ht 5\' 8"  (1.727 m)   Wt 76 kg   SpO2 91%   BMI 25.48 kg/m  Physical Exam Vitals and nursing  note reviewed.  Constitutional:      Appearance: She is well-developed.  HENT:     Head: Normocephalic.     Nose: Nose normal.  Eyes:     General: No scleral icterus.    Conjunctiva/sclera: Conjunctivae normal.  Neck:     Thyroid: No thyromegaly.  Cardiovascular:     Rate and Rhythm: Normal rate and regular rhythm.     Heart sounds: No murmur heard.    No friction rub. No gallop.  Pulmonary:     Breath sounds: No stridor. No wheezing or rales.  Chest:     Chest wall: No tenderness.  Abdominal:  General: There is no distension.     Tenderness: There is no abdominal tenderness. There is no rebound.  Musculoskeletal:        General: Normal range of motion.     Cervical back: Neck supple.  Lymphadenopathy:     Cervical: No cervical adenopathy.  Skin:    Findings: No erythema or rash.  Neurological:     Mental Status: She is alert and oriented to person, place, and time.     Motor: No abnormal muscle tone.     Coordination: Coordination normal.  Psychiatric:        Behavior: Behavior normal.     ED Results / Procedures / Treatments   Labs (all labs ordered are listed, but only abnormal results are displayed) Labs Reviewed  RESP PANEL BY RT-PCR (RSV, FLU A&B, COVID)  RVPGX2 - Abnormal; Notable for the following components:      Result Value   Influenza A by PCR POSITIVE (*)    All other components within normal limits  CBG MONITORING, ED - Abnormal; Notable for the following components:   Glucose-Capillary 150 (*)    All other components within normal limits    EKG None  Radiology No results found.  Procedures Procedures    Medications Ordered in ED Medications  oseltamivir (TAMIFLU) capsule 75 mg (has no administration in time range)  ondansetron (ZOFRAN-ODT) disintegrating tablet 4 mg (4 mg Oral Given 09/05/23 0806)    ED Course/ Medical Decision Making/ A&P                                 Medical Decision Making Risk OTC drugs. Prescription drug  management.   Patient with influenza.  Patient is nontoxic and not hypoxic.  Patient will be started on Tamiflu and Zofran and will follow-up as needed        Final Clinical Impression(s) / ED Diagnoses Final diagnoses:  Influenza A    Rx / DC Orders ED Discharge Orders          Ordered    oseltamivir (TAMIFLU) 75 MG capsule  Every 12 hours        09/05/23 0903    ondansetron (ZOFRAN-ODT) 4 MG disintegrating tablet        09/05/23 1610              Bethann Berkshire, MD 09/07/23 1039

## 2023-09-05 NOTE — Discharge Instructions (Signed)
 Drink plenty of's.  Take Tylenol or Motrin fever or aches.  Follow-up with your doctor if needed

## 2023-09-08 ENCOUNTER — Ambulatory Visit: Payer: 59 | Admitting: Family Medicine

## 2023-09-10 ENCOUNTER — Ambulatory Visit (HOSPITAL_COMMUNITY)
Admission: RE | Admit: 2023-09-10 | Discharge: 2023-09-10 | Disposition: A | Discharge status: Home / Self Care | Source: Ambulatory Visit | Attending: Family Medicine | Admitting: Family Medicine

## 2023-09-10 ENCOUNTER — Ambulatory Visit (INDEPENDENT_AMBULATORY_CARE_PROVIDER_SITE_OTHER): Admitting: Family Medicine

## 2023-09-10 ENCOUNTER — Encounter: Payer: Self-pay | Admitting: Family Medicine

## 2023-09-10 ENCOUNTER — Other Ambulatory Visit (HOSPITAL_COMMUNITY)
Admission: RE | Admit: 2023-09-10 | Discharge: 2023-09-10 | Disposition: A | Discharge status: Home / Self Care | Source: Ambulatory Visit | Attending: Family Medicine | Admitting: Family Medicine

## 2023-09-10 VITALS — BP 144/60 | HR 99 | Temp 98.5°F | Ht 68.0 in | Wt 167.4 lb

## 2023-09-10 DIAGNOSIS — R0602 Shortness of breath: Secondary | ICD-10-CM | POA: Diagnosis not present

## 2023-09-10 DIAGNOSIS — J189 Pneumonia, unspecified organism: Secondary | ICD-10-CM

## 2023-09-10 DIAGNOSIS — R918 Other nonspecific abnormal finding of lung field: Secondary | ICD-10-CM | POA: Diagnosis not present

## 2023-09-10 LAB — CBC WITH DIFFERENTIAL/PLATELET
Abs Immature Granulocytes: 0.03 10*3/uL (ref 0.00–0.07)
Basophils Absolute: 0 10*3/uL (ref 0.0–0.1)
Basophils Relative: 0 %
Eosinophils Absolute: 0.1 10*3/uL (ref 0.0–0.5)
Eosinophils Relative: 1 %
HCT: 39.7 % (ref 36.0–46.0)
Hemoglobin: 13.5 g/dL (ref 12.0–15.0)
Immature Granulocytes: 0 %
Lymphocytes Relative: 21 %
Lymphs Abs: 1.9 10*3/uL (ref 0.7–4.0)
MCH: 29.2 pg (ref 26.0–34.0)
MCHC: 34 g/dL (ref 30.0–36.0)
MCV: 85.9 fL (ref 80.0–100.0)
Monocytes Absolute: 0.8 10*3/uL (ref 0.1–1.0)
Monocytes Relative: 9 %
Neutro Abs: 6.2 10*3/uL (ref 1.7–7.7)
Neutrophils Relative %: 69 %
Platelets: 319 10*3/uL (ref 150–400)
RBC: 4.62 MIL/uL (ref 3.87–5.11)
RDW: 13.2 % (ref 11.5–15.5)
WBC: 8.9 10*3/uL (ref 4.0–10.5)
nRBC: 0 % (ref 0.0–0.2)

## 2023-09-10 LAB — BASIC METABOLIC PANEL
Anion gap: 12 (ref 5–15)
BUN: 12 mg/dL (ref 8–23)
CO2: 29 mmol/L (ref 22–32)
Calcium: 9.7 mg/dL (ref 8.9–10.3)
Chloride: 87 mmol/L — ABNORMAL LOW (ref 98–111)
Creatinine, Ser: 0.77 mg/dL (ref 0.44–1.00)
GFR, Estimated: >60 mL/min (ref >60)
Glucose, Bld: 152 mg/dL — ABNORMAL HIGH (ref 70–99)
Potassium: 4.6 mmol/L (ref 3.5–5.1)
Sodium: 128 mmol/L — ABNORMAL LOW (ref 135–145)

## 2023-09-10 MED ORDER — AZITHROMYCIN 250 MG PO TABS: ORAL_TABLET | ORAL | 0 refills | Status: DC

## 2023-09-10 MED ORDER — AMOXICILLIN-POT CLAVULANATE 875-125 MG PO TABS: 1.0000 | ORAL_TABLET | Freq: Two times a day (BID) | ORAL | 0 refills | Status: DC

## 2023-09-10 MED ORDER — CEFTRIAXONE SODIUM 1 G IJ SOLR
1.0000 g | Freq: Once | INTRAMUSCULAR | Status: AC
  Administered 2023-09-10: 1 g via INTRAMUSCULAR

## 2023-09-10 NOTE — Progress Notes (Signed)
   Subjective:    Patient ID: [name], female    DOB: [birth year], 89 y.o.   MRN: [id number]  Discussed the use of AI scribe software for clinical note transcription with the patient, who gave verbal consent to proceed.  History of Present Illness   The patient is a 89 year old who presents with persistent symptoms following a flu infection.  She has been experiencing symptoms consistent with the flu, including nausea, a high fever of 104F, lethargy, and a persistent cough. Despite completing a course of Tamiflu, she continues to feel 'washed out' with low energy levels that worsen with activity.  Her cough is persistent and tiring, occasionally producing phlegm. She feels a 'burning inside' since the onset of her illness and has not had significant bowel movements, which she attributes to her reduced intake over the past few days.  She has been taking Tamiflu, which she tolerated with some nausea managed by Zofran. She completed the Tamiflu course this morning but feels it has not been very effective. She is currently using over-the-counter Robitussin for her cough.  Her fluid intake is about 48 to 50 ounces daily, including orange juice, electrolytes, and water, but she reports eating very little. She mentions visiting the ER last Friday due to her symptoms, where she had to wait outside due to a busy environment. Her facility has been significantly affected by the flu, with several residents hospitalized or quarantined.         Review of Systems     Objective:    Physical Exam   VITALS: BP- 144/60 CHEST: Lung sounds abnormal with congestion on the right side.     Crackles noted in the right base not in respiratory distress heart regular      Assessment & Plan:  Assessment and Plan    Influenza with suspected secondary pneumonia Symptoms suggest influenza with possible secondary pneumonia due to persistent cough and right-sided congestion. - Administer ceftriaxone  injection. - Prescribe Augmentin twice daily for 7 days and azithromycin daily for 5 days. - Order blood work and chest x-ray to assess for pneumonia. - Advise taking antibiotics with a snack to minimize gastrointestinal symptoms. - Schedule follow-up appointment in two days with Dr. Shirlyn Goltz assistant. - Call with results of blood work and chest x-ray later today.  General Health Maintenance At 89, she needs assistance managing health during illness. - Encourage adequate fluid intake to prevent dehydration. - Advise against taking Muro due to potential for loose stools from antibiotics.     Await test Recheck in 48 hours Warning signs when to go to ER discussed in detail

## 2023-09-11 DIAGNOSIS — F5101 Primary insomnia: Secondary | ICD-10-CM | POA: Diagnosis not present

## 2023-09-11 DIAGNOSIS — F331 Major depressive disorder, recurrent, moderate: Secondary | ICD-10-CM | POA: Diagnosis not present

## 2023-09-11 DIAGNOSIS — F419 Anxiety disorder, unspecified: Secondary | ICD-10-CM | POA: Diagnosis not present

## 2023-09-12 ENCOUNTER — Ambulatory Visit (INDEPENDENT_AMBULATORY_CARE_PROVIDER_SITE_OTHER): Admitting: Family Medicine

## 2023-09-12 ENCOUNTER — Ambulatory Visit: Payer: Medicare Other

## 2023-09-12 ENCOUNTER — Encounter: Payer: Self-pay | Admitting: Family Medicine

## 2023-09-12 VITALS — BP 144/68 | HR 95 | Temp 98.2°F | Ht 68.0 in | Wt 165.0 lb

## 2023-09-12 DIAGNOSIS — J189 Pneumonia, unspecified organism: Secondary | ICD-10-CM | POA: Diagnosis not present

## 2023-09-12 MED ORDER — ALBUTEROL SULFATE HFA 108 (90 BASE) MCG/ACT IN AERS: 2.0000 | INHALATION_SPRAY | Freq: Four times a day (QID) | RESPIRATORY_TRACT | 0 refills | Status: DC | PRN

## 2023-09-12 NOTE — Patient Instructions (Signed)
 Albuterol as directed.  Imaging in 3-4 weeks.  Follow up in 3-4 weeks.

## 2023-09-14 ENCOUNTER — Emergency Department (HOSPITAL_COMMUNITY)

## 2023-09-14 ENCOUNTER — Inpatient Hospital Stay (HOSPITAL_COMMUNITY)
Admission: EM | Admit: 2023-09-14 | Discharge: 2023-09-18 | DRG: 641 | Disposition: A | Discharge status: Skilled Nursing Facility | Source: Skilled Nursing Facility | Attending: Internal Medicine | Admitting: Internal Medicine

## 2023-09-14 ENCOUNTER — Encounter (HOSPITAL_COMMUNITY): Payer: Self-pay | Admitting: Emergency Medicine

## 2023-09-14 ENCOUNTER — Other Ambulatory Visit: Payer: Self-pay

## 2023-09-14 DIAGNOSIS — Z888 Allergy status to other drugs, medicaments and biological substances status: Secondary | ICD-10-CM

## 2023-09-14 DIAGNOSIS — E1142 Type 2 diabetes mellitus with diabetic polyneuropathy: Secondary | ICD-10-CM | POA: Diagnosis present

## 2023-09-14 DIAGNOSIS — R5381 Other malaise: Secondary | ICD-10-CM | POA: Diagnosis not present

## 2023-09-14 DIAGNOSIS — R531 Weakness: Secondary | ICD-10-CM

## 2023-09-14 DIAGNOSIS — E871 Hypo-osmolality and hyponatremia: Secondary | ICD-10-CM | POA: Diagnosis present

## 2023-09-14 DIAGNOSIS — Z7984 Long term (current) use of oral hypoglycemic drugs: Secondary | ICD-10-CM | POA: Diagnosis not present

## 2023-09-14 DIAGNOSIS — I10 Essential (primary) hypertension: Secondary | ICD-10-CM | POA: Diagnosis present

## 2023-09-14 DIAGNOSIS — E1159 Type 2 diabetes mellitus with other circulatory complications: Secondary | ICD-10-CM

## 2023-09-14 DIAGNOSIS — I700 Atherosclerosis of aorta: Secondary | ICD-10-CM | POA: Diagnosis not present

## 2023-09-14 DIAGNOSIS — Z96651 Presence of right artificial knee joint: Secondary | ICD-10-CM | POA: Diagnosis present

## 2023-09-14 DIAGNOSIS — Z90710 Acquired absence of both cervix and uterus: Secondary | ICD-10-CM | POA: Diagnosis not present

## 2023-09-14 DIAGNOSIS — Z66 Do not resuscitate: Secondary | ICD-10-CM | POA: Diagnosis present

## 2023-09-14 DIAGNOSIS — Z79899 Other long term (current) drug therapy: Secondary | ICD-10-CM

## 2023-09-14 DIAGNOSIS — R54 Age-related physical debility: Secondary | ICD-10-CM | POA: Diagnosis present

## 2023-09-14 DIAGNOSIS — E869 Volume depletion, unspecified: Secondary | ICD-10-CM | POA: Diagnosis present

## 2023-09-14 DIAGNOSIS — Z8249 Family history of ischemic heart disease and other diseases of the circulatory system: Secondary | ICD-10-CM | POA: Diagnosis not present

## 2023-09-14 DIAGNOSIS — E785 Hyperlipidemia, unspecified: Secondary | ICD-10-CM | POA: Diagnosis present

## 2023-09-14 DIAGNOSIS — Z833 Family history of diabetes mellitus: Secondary | ICD-10-CM

## 2023-09-14 DIAGNOSIS — R059 Cough, unspecified: Secondary | ICD-10-CM | POA: Diagnosis not present

## 2023-09-14 DIAGNOSIS — J189 Pneumonia, unspecified organism: Secondary | ICD-10-CM | POA: Insufficient documentation

## 2023-09-14 DIAGNOSIS — R0609 Other forms of dyspnea: Secondary | ICD-10-CM | POA: Diagnosis not present

## 2023-09-14 DIAGNOSIS — E872 Acidosis, unspecified: Secondary | ICD-10-CM | POA: Diagnosis present

## 2023-09-14 DIAGNOSIS — I160 Hypertensive urgency: Principal | ICD-10-CM

## 2023-09-14 DIAGNOSIS — E860 Dehydration: Principal | ICD-10-CM | POA: Diagnosis present

## 2023-09-14 DIAGNOSIS — R0602 Shortness of breath: Secondary | ICD-10-CM | POA: Diagnosis not present

## 2023-09-14 LAB — COMPREHENSIVE METABOLIC PANEL
ALT: 17 U/L (ref 0–44)
AST: 23 U/L (ref 15–41)
Albumin: 3.9 g/dL (ref 3.5–5.0)
Alkaline Phosphatase: 76 U/L (ref 38–126)
Anion gap: 14 (ref 5–15)
BUN: 15 mg/dL (ref 8–23)
CO2: 24 mmol/L (ref 22–32)
Calcium: 10 mg/dL (ref 8.9–10.3)
Chloride: 93 mmol/L — ABNORMAL LOW (ref 98–111)
Creatinine, Ser: 0.76 mg/dL (ref 0.44–1.00)
GFR, Estimated: >60 mL/min (ref >60)
Glucose, Bld: 126 mg/dL — ABNORMAL HIGH (ref 70–99)
Potassium: 4.1 mmol/L (ref 3.5–5.1)
Sodium: 131 mmol/L — ABNORMAL LOW (ref 135–145)
Total Bilirubin: 0.5 mg/dL (ref 0.0–1.2)
Total Protein: 6.8 g/dL (ref 6.5–8.1)

## 2023-09-14 LAB — URINALYSIS, ROUTINE W REFLEX MICROSCOPIC
Bacteria, UA: NONE SEEN
Bilirubin Urine: NEGATIVE
Glucose, UA: NEGATIVE mg/dL
Hgb urine dipstick: NEGATIVE
Ketones, ur: NEGATIVE mg/dL
Leukocytes,Ua: NEGATIVE
Nitrite: NEGATIVE
Protein, ur: 100 mg/dL — AB
Specific Gravity, Urine: 1.013 (ref 1.005–1.030)
pH: 8 (ref 5.0–8.0)

## 2023-09-14 LAB — LACTIC ACID, PLASMA
Lactic Acid, Venous: 3.5 mmol/L (ref 0.5–1.9)
Lactic Acid, Venous: 2.7 mmol/L (ref 0.5–1.9)

## 2023-09-14 LAB — CBC WITH DIFFERENTIAL/PLATELET
Abs Immature Granulocytes: 0.12 10*3/uL — ABNORMAL HIGH (ref 0.00–0.07)
Basophils Absolute: 0 10*3/uL (ref 0.0–0.1)
Basophils Relative: 0 %
Eosinophils Absolute: 0.1 10*3/uL (ref 0.0–0.5)
Eosinophils Relative: 1 %
HCT: 38.6 % (ref 36.0–46.0)
Hemoglobin: 12.9 g/dL (ref 12.0–15.0)
Immature Granulocytes: 2 %
Lymphocytes Relative: 21 %
Lymphs Abs: 1.7 10*3/uL (ref 0.7–4.0)
MCH: 29.3 pg (ref 26.0–34.0)
MCHC: 33.4 g/dL (ref 30.0–36.0)
MCV: 87.7 fL (ref 80.0–100.0)
Monocytes Absolute: 0.5 10*3/uL (ref 0.1–1.0)
Monocytes Relative: 7 %
Neutro Abs: 5.5 10*3/uL (ref 1.7–7.7)
Neutrophils Relative %: 69 %
Platelets: 344 10*3/uL (ref 150–400)
RBC: 4.4 MIL/uL (ref 3.87–5.11)
RDW: 13.2 % (ref 11.5–15.5)
WBC: 8 10*3/uL (ref 4.0–10.5)
nRBC: 0 % (ref 0.0–0.2)

## 2023-09-14 LAB — GLUCOSE, CAPILLARY: Glucose-Capillary: 137 mg/dL — ABNORMAL HIGH (ref 70–99)

## 2023-09-14 LAB — MAGNESIUM: Magnesium: 1.8 mg/dL (ref 1.7–2.4)

## 2023-09-14 MED ORDER — ACETAMINOPHEN 325 MG PO TABS: 650.0000 mg | ORAL_TABLET | Freq: Four times a day (QID) | ORAL | Status: DC | PRN

## 2023-09-14 MED ORDER — ONDANSETRON HCL 4 MG PO TABS: 4.0000 mg | ORAL_TABLET | Freq: Four times a day (QID) | ORAL | Status: DC | PRN

## 2023-09-14 MED ORDER — AMLODIPINE BESYLATE 5 MG PO TABS
10.0000 mg | ORAL_TABLET | Freq: Every day | ORAL | Status: DC
Start: 2023-09-14 — End: 2023-09-18
  Administered 2023-09-14 – 2023-09-18 (×5): 10 mg via ORAL
  Filled 2023-09-14 (×5): qty 2

## 2023-09-14 MED ORDER — SODIUM CHLORIDE 0.9 % IV SOLN
1.0000 g | Freq: Once | INTRAVENOUS | Status: AC
  Administered 2023-09-14: 1 g via INTRAVENOUS
  Filled 2023-09-14: qty 10

## 2023-09-14 MED ORDER — ENOXAPARIN SODIUM 40 MG/0.4ML IJ SOSY
40.0000 mg | PREFILLED_SYRINGE | INTRAMUSCULAR | Status: DC
  Administered 2023-09-15 – 2023-09-18 (×4): 40 mg via SUBCUTANEOUS
  Filled 2023-09-14 (×4): qty 0.4

## 2023-09-14 MED ORDER — ALBUTEROL SULFATE (2.5 MG/3ML) 0.083% IN NEBU: 2.5000 mg | INHALATION_SOLUTION | Freq: Four times a day (QID) | RESPIRATORY_TRACT | Status: DC | PRN

## 2023-09-14 MED ORDER — ALBUTEROL SULFATE HFA 108 (90 BASE) MCG/ACT IN AERS: 2.0000 | INHALATION_SPRAY | Freq: Four times a day (QID) | RESPIRATORY_TRACT | Status: DC | PRN

## 2023-09-14 MED ORDER — SODIUM CHLORIDE 0.9 % IV SOLN: INTRAVENOUS | Status: DC

## 2023-09-14 MED ORDER — ALPRAZOLAM 0.5 MG PO TABS
0.5000 mg | ORAL_TABLET | Freq: Once | ORAL | Status: AC
  Administered 2023-09-15: 0.5 mg via ORAL
  Filled 2023-09-14: qty 1

## 2023-09-14 MED ORDER — IRBESARTAN 150 MG PO TABS
75.0000 mg | ORAL_TABLET | Freq: Every day | ORAL | Status: DC
  Administered 2023-09-14 – 2023-09-18 (×5): 75 mg via ORAL
  Filled 2023-09-14 (×5): qty 1

## 2023-09-14 MED ORDER — ONDANSETRON HCL 4 MG/2ML IJ SOLN
4.0000 mg | Freq: Four times a day (QID) | INTRAMUSCULAR | Status: DC | PRN
Start: 2023-09-14 — End: 2023-09-18

## 2023-09-14 MED ORDER — INSULIN ASPART 100 UNIT/ML IJ SOLN: 0.0000 [IU] | Freq: Every day | INTRAMUSCULAR | Status: DC

## 2023-09-14 MED ORDER — LABETALOL HCL 5 MG/ML IV SOLN
10.0000 mg | Freq: Once | INTRAVENOUS | Status: AC
  Administered 2023-09-14: 10 mg via INTRAVENOUS
  Filled 2023-09-14: qty 4

## 2023-09-14 MED ORDER — ALPRAZOLAM 0.25 MG PO TABS
0.2500 mg | ORAL_TABLET | Freq: Every evening | ORAL | Status: DC | PRN
  Administered 2023-09-15 – 2023-09-17 (×3): 0.25 mg via ORAL
  Filled 2023-09-14 (×3): qty 1

## 2023-09-14 MED ORDER — HYDRALAZINE HCL 20 MG/ML IJ SOLN
10.0000 mg | INTRAMUSCULAR | Status: AC
  Administered 2023-09-14: 10 mg via INTRAVENOUS
  Filled 2023-09-14: qty 1

## 2023-09-14 MED ORDER — SODIUM CHLORIDE 0.9 % IV BOLUS
500.0000 mL | Freq: Once | INTRAVENOUS | Status: AC
  Administered 2023-09-14: 500 mL via INTRAVENOUS

## 2023-09-14 MED ORDER — ACETAMINOPHEN 650 MG RE SUPP: 650.0000 mg | Freq: Four times a day (QID) | RECTAL | Status: DC | PRN

## 2023-09-14 MED ORDER — INSULIN ASPART 100 UNIT/ML IJ SOLN
0.0000 [IU] | Freq: Three times a day (TID) | INTRAMUSCULAR | Status: DC
  Administered 2023-09-15 – 2023-09-18 (×6): 1 [IU] via SUBCUTANEOUS

## 2023-09-14 MED ORDER — LABETALOL HCL 5 MG/ML IV SOLN: 10.0000 mg | INTRAVENOUS | Status: DC | PRN

## 2023-09-14 NOTE — Assessment & Plan Note (Addendum)
 Overall, doing okay. Wheezing on exam. Albuterol as directed. Advised to finish antibiotic course.  Recommend probiotic and/or Yogurt due to antibiotic associated diarrhea. Recommended follow up with Dr. Lorin Picket in 3-4 weeks for re-evaluation and repeat imaging.

## 2023-09-14 NOTE — Assessment & Plan Note (Signed)
 Controlled.  A1c 6.6. -Hold metformin - SSI- S

## 2023-09-14 NOTE — Plan of Care (Signed)

## 2023-09-14 NOTE — Assessment & Plan Note (Addendum)
 Likely secondary to recent influenza infection, possibly some dehydration and now diarrhea.  Ambulates with walker at baseline.  Currently on antibiotics for possible bacterial co-infection with influenza diagnosed 3/12.   Lactic acidosis 3.5> 2.7.  WBC 8.  Not meeting criteria for sepsis. - Chest x-ray today no acute abnormality, UA not suggestive of infection. - Stool C. Difficile - Holding antibiotics for now -500 mL bolus given, continue N/s 75cc/hr x 15hrs -PT eval when able.

## 2023-09-14 NOTE — H&P (Signed)
 History and Physical    [name]:[id number] DOB: 1931-12-07 DOA: 09/14/2023  PCP: Babs Sciara, MD   Patient coming from: [facility]  I have personally briefly reviewed patient's old medical records in [facility]  Chief Complaint: Weakness  HPI: [name] is a 89 y.o. female with medical history significant for diabetes mellitus, hypertension, anxiety, C. difficile diarrhea. Patient [facility] for reports of generalized weakness.  She was recently diagnosed with pneumonia on chest x-ray (minimal right middle lobe subsegmental atelectasis or infiltrate), and influenza infection-3/12, was started on antibiotics azithromycin and Augmentin, she has been compliant with.  She reports breathing has improved, but has persistent cough.  No fevers no chills, no vomiting.  She reports diarrhea since starting antibiotics, up to 4-5 times daily.  Reports remote history of C. difficile infection over 30 years ago.  Reports she is unable to walk due to generalized weakness, feeling drained, and cannot go back to facility. No chest pain no lower extremity swelling.  ED Course: Temperature 98.3.  Heart rate 60s to 89.  Respiratory rate 16-22.  Blood pressure elevated up to 220 with a wide pulse pressure. Normal WBC 8. Lactic acidosis of 3.5> 2.7 UA not suggestive of UTI.  Chest x-ray clear.  Review of Systems: As per HPI all other systems reviewed and negative.  Past Medical History:  Diagnosis Date   Blood transfusion without reported diagnosis    Cognitive dysfunction 03/04/2019   Patient scores 21 out of 30 on Montreal cognitive assessment September 2020   Diabetes mellitus without complication (HCC)    Diabetic peripheral neuropathy associated with type 2 diabetes mellitus (HCC) 02/25/2022   Diverticulitis    Frailty 03/04/2019   H/O bilateral breast reduction surgery    Hyperlipidemia    a. intolerant to statins.    Hypertension      Past Surgical History:  Procedure Laterality Date   ABDOMINAL HYSTERECTOMY  2003   APPENDECTOMY     age 18   BIOPSY  04/10/2023   Procedure: BIOPSY;  Surgeon: Franky Macho, MD;  Location: [facility];  Service: Endoscopy;;   BREAST REDUCTION SURGERY     age 65   COLON SURGERY Left 2011   Hemicolectomy due to diverticulitis   ESOPHAGOGASTRODUODENOSCOPY (EGD) WITH PROPOFOL N/A 04/10/2023   Procedure: ESOPHAGOGASTRODUODENOSCOPY (EGD) WITH PROPOFOL;  Surgeon: Franky Macho, MD;  Location: [facility];  Service: Endoscopy;  Laterality: N/A;   EYE SURGERY  03/30/2009   cataract   IR ANGIOGRAM VISCERAL SELECTIVE  04/11/2023   IR INTRAVASCULAR ULTRASOUND NON CORONARY  04/11/2023   IR TRANSCATH PLC STENT 1ST ART NOT LE CV CAR VERT CAR  04/11/2023   IR US GUIDE VASC ACCESS RIGHT  04/11/2023   KNEE SURGERY Right 2003   knee cap   LAPAROSCOPIC INCISIONAL / UMBILICAL / VENTRAL HERNIA REPAIR  02/23/2007   REDUCTION MAMMAPLASTY Bilateral 2001   REFRACTIVE SURGERY     TOTAL KNEE ARTHROPLASTY Right 04/10/2022   Procedure: TOTAL KNEE ARTHROPLASTY;  Surgeon: Eugenia Mcalpine, MD;  Location: [facility];  Service: Orthopedics;  Laterality: Right;  adductor canal     reports that she has never smoked. She has never used smokeless tobacco. She reports that she does not drink alcohol and does not use drugs.  Allergies  Allergen Reactions   Hydrocortisone Itching   Lisinopril     Other reaction(s): Lethargy (intolerance)   Phenergan [Promethazine Hcl]  Other (See Comments)    hallucinations   Statins Other (See Comments)    Severe myalgias   Trazodone And Nefazodone Cough   Prednisone Rash    Family History  Problem Relation Age of Onset   Cancer Mother        ovaian   Hypertension Father        kidney   Stroke Father    Cancer Father    Hypertension Sister    Diabetes Brother     Prior to Admission medications   Medication Sig Start Date End Date Taking? Authorizing  Provider  acetaminophen (TYLENOL) 325 MG tablet TAKE (2) TABLETS BY MOUTH EVERY SIX HOURS AS NEEDED FOR PAIN. MAX 3GM IN 24 HOURS. 07/28/23   Babs Sciara, MD  albuterol (VENTOLIN HFA) 108 (90 Base) MCG/ACT inhaler Inhale 2 puffs into the lungs every 6 (six) hours as needed for wheezing or shortness of breath. 09/12/23   Tommie Sams, DO  ALPRAZolam (XANAX) 0.25 MG tablet TAKE (1) TABLET BY MOUTH AT BEDTIME AS NEEDED FOR SLEEP. 08/13/23   Babs Sciara, MD  amLODipine (NORVASC) 10 MG tablet Take 1 tablet (10 mg total) by mouth daily. 12/02/22   Shon Hale, MD  amoxicillin-clavulanate (AUGMENTIN) 875-125 MG tablet Take 1 tablet by mouth 2 (two) times daily. 09/10/23   Babs Sciara, MD  aspirin (ASPIRIN LOW DOSE) 81 MG chewable tablet CHEW (1) TABLET BY MOUTH ONCE DAILY. 09/06/23   Babs Sciara, MD  azithromycin (ZITHROMAX) 250 MG tablet Take 2 tablets on day 1, then 1 tablet daily on days 2 through 5 09/10/23 09/15/23  Luking, Scott A, MD  benzonatate (TESSALON) 100 MG capsule TAKE (1) CAPSULE BY MOUTH 3 TIMES DAILY AS NEEDED FOR COUGH. 11/21/22   Babs Sciara, MD  BETA CAROTENE PROVITAMIN A 40981 units capsule Take 25,000 Units by mouth daily. 01/01/23   [provider]  BISACODYL 5 MG EC tablet TAKE 1 TABLET BY MOUTH ONCE DAILY AS NEEDED FOR MODERATE CONSTIPATION. Patient taking differently: Take 5 mg by mouth daily as needed for moderate constipation. 12/20/22   Babs Sciara, MD  cyanocobalamin 1000 MCG tablet Take 1,000 mcg by mouth at bedtime.    [provider]  dicyclomine (BENTYL) 10 MG capsule TAKE 1 CAPSULE BY MOUTH TWICE DAILY AS NEEDED FOR ABDOMINAL CRAMPS AND LOOSE STOOL. Patient taking differently: Take 10 mg by mouth 2 (two) times daily as needed (abdominal cramps and loose stools.). TAKE 1 CAPSULE BY MOUTH TWICE DAILY AS NEEDED FOR ABDOMINAL CRAMPS AND LOOSE STOOL. 11/21/22   Babs Sciara, MD  EASYMAX TEST test strip CHECK BLOOD SUGAR ONCE DAILY.(CALL MD IF  BS BELOW 60: OR IF BS ABOVE 400) 07/21/23   Babs Sciara, MD  gabapentin (NEURONTIN) 100 MG capsule 1 twice daily for 5 days then 1 daily for 5 days then stop 08/25/23   Luking, [name], MD  GOODSENSE HEMORRHOIDAL 0.25-14-74.9 % rectal ointment Place 1 Application rectally 3 (three) times daily as needed for irritation. 12/26/21   [provider]  Iron, Ferrous Sulfate, 325 (65 Fe) MG TABS Take 325 mg by mouth every other day. 02/20/23   Babs Sciara, MD  IVIZIA DRY EYES 0.5 % SOLN Place 1 drop into both eyes in the morning, at noon, in the evening, and at bedtime. 03/27/23   [provider]  loperamide (ANTI-DIARRHEAL) 2 MG tablet TAKE 1 TABLET BY MOUTH AS NEEDED FOR DIARRHEA OR LOOSE STOOL**MAX  4 TABLETS IN 24 HOURS** Patient taking differently: Take 2 mg by mouth 4 (four) times daily as needed for diarrhea or loose stools. 12/27/22   Babs Sciara, MD  Melatonin 5 MG CAPS Take 1 capsule (5 mg total) by mouth at bedtime. 05/12/23   Babs Sciara, MD  metFORMIN (GLUCOPHAGE) 500 MG tablet Take one tablet 500mg  po in the morning and 1/2 tablet 250 mg po at supper Patient taking differently: Take 750 mg by mouth daily. Take one tablet 500mg  po in the morning and 1/2 tablet 250 mg po at supper 12/02/22   Shon Hale, MD  Multiple Vitamins-Minerals (PRESERVISION AREDS 2) CAPS Take 1 capsule by mouth in the morning and at bedtime.    [provider]  Omega-3 Fatty Acids (FISH OIL) 1000 MG CAPS One capsule BID Patient taking differently: Take 1 capsule by mouth in the morning and at bedtime. 08/07/21   Babs Sciara, MD  ondansetron (ZOFRAN-ODT) 4 MG disintegrating tablet 4mg  ODT q4 hours prn nausea/vomit 09/05/23   Bethann Berkshire, MD  pantoprazole (PROTONIX) 40 MG tablet Take 1 tablet (40 mg total) by mouth daily. 05/06/23   Babs Sciara, MD  polyethylene glycol powder (GOODSENSE CLEARLAX) 17 GM/SCOOP powder 1 scoop in 8 ounces water once daily as needed for constipation  06/03/23   Luking, Scott A, MD  RESTASIS 0.05 % ophthalmic emulsion Place 1 drop into both eyes 2 (two) times daily. 04/03/23   [provider]  sertraline (ZOLOFT) 25 MG tablet Take 1 tablet (25 mg total) by mouth daily. 05/06/23   Babs Sciara, MD  valsartan (DIOVAN) 160 MG tablet Take 1 tablet (160 mg total) by mouth daily. 08/25/23   Babs Sciara, MD  Vitamin D, Cholecalciferol, 10 MCG (400 UNIT) TABS TAKE (1) TABLET BY MOUTH ONCE DAILY. 05/09/23   Babs Sciara, MD    Physical Exam: Vitals:   09/14/23 2045 09/14/23 2100 09/14/23 2149 09/14/23 2200  BP: (!) 194/64 (!) 186/63 (!) 178/58 (!) 182/73  Pulse: 63 62 63 62  Resp: 16 19 18    Temp:      TempSrc:      SpO2: 96% 96% 97% 96%  Weight:      Height:        Constitutional: NAD, calm, comfortable Vitals:   09/14/23 2045 09/14/23 2100 09/14/23 2149 09/14/23 2200  BP: (!) 194/64 (!) 186/63 (!) 178/58 (!) 182/73  Pulse: 63 62 63 62  Resp: 16 19 18    Temp:      TempSrc:      SpO2: 96% 96% 97% 96%  Weight:      Height:       Eyes: PERRL, lids and conjunctivae normal ENMT: Mucous membranes are mildly dry.   Neck: normal, supple, no masses, no thyromegaly Respiratory: clear to auscultation bilaterally, no wheezing, no crackles. Normal respiratory effort. No accessory muscle use.  Cardiovascular: Regular rate and rhythm, no murmurs / rubs / gallops. No extremity edema.  Extremities warm. Abdomen: no tenderness, no masses palpated. No hepatosplenomegaly. Bowel sounds positive.  Musculoskeletal: no clubbing / cyanosis. No joint deformity upper and lower extremities.  Skin: no rashes, lesions, ulcers. No induration Neurologic: No facial asymmetry, moving extremities spontaneously, speech fluent. Psychiatric: Normal judgment and insight. Alert and oriented x 3. Normal mood.   Labs on Admission: I have personally reviewed following labs and imaging studies  CBC: Recent Labs  Lab 09/10/23 1131 09/14/23 1638  WBC  8.9 8.0  NEUTROABS 6.2 5.5  HGB 13.5 12.9  HCT 39.7 38.6  MCV 85.9 87.7  PLT 319 344   Basic Metabolic Panel: Recent Labs  Lab 09/10/23 1131 09/14/23 1638  NA 128* 131*  K 4.6 4.1  CL 87* 93*  CO2 29 24  GLUCOSE 152* 126*  BUN 12 15  CREATININE 0.77 0.76  CALCIUM 9.7 10.0  MG  --  1.8   GFR: Estimated Creatinine Clearance: 47.1 mL/min (by C-G formula based on SCr of 0.76 mg/dL). Liver Function Tests: Recent Labs  Lab 09/14/23 1638  AST 23  ALT 17  ALKPHOS 76  BILITOT 0.5  PROT 6.8  ALBUMIN 3.9   Urine analysis:    Component Value Date/Time   COLORURINE YELLOW 09/14/2023 1720   APPEARANCEUR CLEAR 09/14/2023 1720   APPEARANCEUR Clear 05/21/2023 1333   LABSPEC 1.013 09/14/2023 1720   PHURINE 8.0 09/14/2023 1720   GLUCOSEU NEGATIVE 09/14/2023 1720   HGBUR NEGATIVE 09/14/2023 1720   BILIRUBINUR NEGATIVE 09/14/2023 1720   BILIRUBINUR negative 08/25/2023 0913   BILIRUBINUR Negative 05/21/2023 1333   KETONESUR NEGATIVE 09/14/2023 1720   PROTEINUR 100 (A) 09/14/2023 1720   UROBILINOGEN 0.2 08/25/2023 0913   UROBILINOGEN 0.2 12/15/2014 1956   NITRITE NEGATIVE 09/14/2023 1720   LEUKOCYTESUR NEGATIVE 09/14/2023 1720    Radiological Exams on Admission: DG Chest 2 View Result Date: 09/14/2023 CLINICAL DATA:  Cough.  Shortness of breath. EXAM: CHEST - 2 VIEW COMPARISON:  09/10/2023. FINDINGS: Bilateral lung fields are clear. Bilateral costophrenic angles are clear. Stable cardio-mediastinal silhouette. No acute osseous abnormalities. The soft tissues are within normal limits. IMPRESSION: No active cardiopulmonary disease. Electronically Signed   By: Jules Schick M.D.   On: 09/14/2023 16:43    EKG: Independently reviewed.,   Sinus rhythm rate- 88, QTc 460.  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  Ambulates with walker at baseline.  Currently on antibiotics for possible bacterial co-infection with influenza diagnosed 3/12.   Lactic acidosis 3.5> 2.7.  WBC 8.  Not meeting criteria for sepsis. - Chest x-ray today no acute abnormality, UA not suggestive of infection. - Stool C. Difficile - Holding antibiotics for now -500 mL bolus given, continue N/s 75cc/hr x 15hrs -PT eval when able.  Hypertensive urgency Blood pressure elevated systolic up to 220 with wide pulse pressure.  No response to hydralazine in the ED. 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  A1c 6.6. -Hold metformin - SSI- S   DVT prophylaxis: Lovenox Code Status: DNR, Universal DNR form at bedside, confirmed validity with patient at bedside Family Communication: None at bedside.  Unfortunately I am unable to reach her son Barbara Cower as she requested to update him on patient's admission and plan of care. Disposition Plan: ~ 2 days Consults called: None  Admission status: Obs tele  I certify that at the point of admission it is my clinical judgment that the patient will require inpatient hospital care spanning beyond 2 midnights from the point of admission due to high intensity of service, high risk for further deterioration and high frequency of surveillance required.    Author: Onnie Boer, MD  09/14/2023 11:44 PM  For on call review [email or web address].

## 2023-09-14 NOTE — ED Provider Notes (Signed)
 [place] EMERGENCY DEPARTMENT AT [facility] Provider Note   CSN: [id number] Arrival date & time: 09/14/23  1505     History  Chief Complaint  Patient presents with   Weakness    KAROLINE FLEER is a 89 y.o. female.   Weakness  This patient is a 89 year old female, presents with complaint of generalized weakness.  She had been diagnosed with influenza approximately a week and a half ago and then a follow-up visit with her doctor on 12 March approximately 4 days ago yielded a possible diagnosis of a right lower lobe pneumonia based on an x-ray that was performed.  She was started on Augmentin after getting Rocephin in the office as well as Zithromax and she now states that she is so weak that she can barely sit up in the bed, she is having diarrhea, she has no energy, she is currently at [facility] facility and states that she cannot do anything for herself because of her severe lethargy.  She has not been vomiting but has not wanted to eat because she is feeling so poorly    Home Medications Prior to Admission medications   Medication Sig Start Date End Date Taking? Authorizing Provider  acetaminophen (TYLENOL) 325 MG tablet TAKE (2) TABLETS BY MOUTH EVERY SIX HOURS AS NEEDED FOR PAIN. MAX 3GM IN 24 HOURS. 07/28/23   Babs Sciara, MD  albuterol (VENTOLIN HFA) 108 (90 Base) MCG/ACT inhaler Inhale 2 puffs into the lungs every 6 (six) hours as needed for wheezing or shortness of breath. 09/12/23   Tommie Sams, DO  ALPRAZolam (XANAX) 0.25 MG tablet TAKE (1) TABLET BY MOUTH AT BEDTIME AS NEEDED FOR SLEEP. 08/13/23   Babs Sciara, MD  amLODipine (NORVASC) 10 MG tablet Take 1 tablet (10 mg total) by mouth daily. 12/02/22   Shon Hale, MD  amoxicillin-clavulanate (AUGMENTIN) 875-125 MG tablet Take 1 tablet by mouth 2 (two) times daily. 09/10/23   Babs Sciara, MD  aspirin (ASPIRIN LOW DOSE) 81 MG chewable tablet CHEW (1) TABLET BY MOUTH ONCE DAILY. 09/06/23    Babs Sciara, MD  azithromycin (ZITHROMAX) 250 MG tablet Take 2 tablets on day 1, then 1 tablet daily on days 2 through 5 09/10/23 09/15/23  Luking, Scott A, MD  benzonatate (TESSALON) 100 MG capsule TAKE (1) CAPSULE BY MOUTH 3 TIMES DAILY AS NEEDED FOR COUGH. 11/21/22   Babs Sciara, MD  BETA CAROTENE PROVITAMIN A 24401 units capsule Take 25,000 Units by mouth daily. 01/01/23   [provider]  BISACODYL 5 MG EC tablet TAKE 1 TABLET BY MOUTH ONCE DAILY AS NEEDED FOR MODERATE CONSTIPATION. Patient taking differently: Take 5 mg by mouth daily as needed for moderate constipation. 12/20/22   Babs Sciara, MD  cyanocobalamin 1000 MCG tablet Take 1,000 mcg by mouth at bedtime.    [provider]  dicyclomine (BENTYL) 10 MG capsule TAKE 1 CAPSULE BY MOUTH TWICE DAILY AS NEEDED FOR ABDOMINAL CRAMPS AND LOOSE STOOL. Patient taking differently: Take 10 mg by mouth 2 (two) times daily as needed (abdominal cramps and loose stools.). TAKE 1 CAPSULE BY MOUTH TWICE DAILY AS NEEDED FOR ABDOMINAL CRAMPS AND LOOSE STOOL. 11/21/22   Babs Sciara, MD  EASYMAX TEST test strip CHECK BLOOD SUGAR ONCE DAILY.(CALL MD IF BS BELOW 60: OR IF BS ABOVE 400) 07/21/23   Luking, [name], MD  gabapentin (NEURONTIN) 100 MG capsule 1 twice daily for 5 days then 1  daily for 5 days then stop 08/25/23   Babs Sciara, MD  GOODSENSE HEMORRHOIDAL 0.25-14-74.9 % rectal ointment Place 1 Application rectally 3 (three) times daily as needed for irritation. 12/26/21   [provider]  Iron, Ferrous Sulfate, 325 (65 Fe) MG TABS Take 325 mg by mouth every other day. 02/20/23   Babs Sciara, MD  IVIZIA DRY EYES 0.5 % SOLN Place 1 drop into both eyes in the morning, at noon, in the evening, and at bedtime. 03/27/23   [provider]  loperamide (ANTI-DIARRHEAL) 2 MG tablet TAKE 1 TABLET BY MOUTH AS NEEDED FOR DIARRHEA OR LOOSE STOOL**MAX 4 TABLETS IN 24 HOURS** Patient taking differently: Take 2 mg by mouth 4  (four) times daily as needed for diarrhea or loose stools. 12/27/22   Babs Sciara, MD  Melatonin 5 MG CAPS Take 1 capsule (5 mg total) by mouth at bedtime. 05/12/23   Babs Sciara, MD  metFORMIN (GLUCOPHAGE) 500 MG tablet Take one tablet 500mg  po in the morning and 1/2 tablet 250 mg po at supper Patient taking differently: Take 750 mg by mouth daily. Take one tablet 500mg  po in the morning and 1/2 tablet 250 mg po at supper 12/02/22   Shon Hale, MD  Multiple Vitamins-Minerals (PRESERVISION AREDS 2) CAPS Take 1 capsule by mouth in the morning and at bedtime.    [provider]  Omega-3 Fatty Acids (FISH OIL) 1000 MG CAPS One capsule BID Patient taking differently: Take 1 capsule by mouth in the morning and at bedtime. 08/07/21   Babs Sciara, MD  ondansetron (ZOFRAN-ODT) 4 MG disintegrating tablet 4mg  ODT q4 hours prn nausea/vomit 09/05/23   Bethann Berkshire, MD  pantoprazole (PROTONIX) 40 MG tablet Take 1 tablet (40 mg total) by mouth daily. 05/06/23   Babs Sciara, MD  polyethylene glycol powder (GOODSENSE CLEARLAX) 17 GM/SCOOP powder 1 scoop in 8 ounces water once daily as needed for constipation 06/03/23   Luking, Scott A, MD  RESTASIS 0.05 % ophthalmic emulsion Place 1 drop into both eyes 2 (two) times daily. 04/03/23   [provider]  sertraline (ZOLOFT) 25 MG tablet Take 1 tablet (25 mg total) by mouth daily. 05/06/23   Babs Sciara, MD  valsartan (DIOVAN) 160 MG tablet Take 1 tablet (160 mg total) by mouth daily. 08/25/23   Babs Sciara, MD  Vitamin D, Cholecalciferol, 10 MCG (400 UNIT) TABS TAKE (1) TABLET BY MOUTH ONCE DAILY. 05/09/23   Babs Sciara, MD      Allergies    Hydrocortisone, Lisinopril, Phenergan [promethazine hcl], Statins, Trazodone and nefazodone, and Prednisone    Review of Systems   Review of Systems  Neurological:  Positive for weakness.  All other systems reviewed and are negative.   Physical Exam Updated Vital Signs BP (!)  216/74 (BP Location: Right Arm)   Pulse 79   Temp 98.3 F (36.8 C) (Oral)   Resp 19   Ht 1.727 m (5\' 8" )   Wt 74.8 kg   SpO2 98%   BMI 25.07 kg/m  Physical Exam Vitals and nursing note reviewed.  Constitutional:      General: She is not in acute distress.    Appearance: She is well-developed.  HENT:     Head: Normocephalic and atraumatic.     Mouth/Throat:     Mouth: Mucous membranes are moist.     Pharynx: No oropharyngeal exudate.  Eyes:     General: No scleral icterus.  Right eye: No discharge.        Left eye: No discharge.     Conjunctiva/sclera: Conjunctivae normal.     Pupils: Pupils are equal, round, and reactive to light.  Neck:     Thyroid: No thyromegaly.     Vascular: No JVD.  Cardiovascular:     Rate and Rhythm: Normal rate and regular rhythm.     Heart sounds: Normal heart sounds. No murmur heard.    No friction rub. No gallop.  Pulmonary:     Effort: Pulmonary effort is normal. No respiratory distress.     Breath sounds: Normal breath sounds. No wheezing or rales.  Abdominal:     General: Bowel sounds are normal. There is no distension.     Palpations: Abdomen is soft. There is no mass.     Tenderness: There is no abdominal tenderness.  Musculoskeletal:        General: No tenderness. Normal range of motion.     Cervical back: Normal range of motion and neck supple.     Right lower leg: No edema.     Left lower leg: No edema.  Lymphadenopathy:     Cervical: No cervical adenopathy.  Skin:    General: Skin is warm and dry.     Findings: No erythema or rash.  Neurological:     General: No focal deficit present.     Mental Status: She is alert.     Coordination: Coordination normal.  Psychiatric:        Behavior: Behavior normal.     ED Results / Procedures / Treatments   Labs (all labs ordered are listed, but only abnormal results are displayed) Labs Reviewed  CBC WITH DIFFERENTIAL/PLATELET - Abnormal; Notable for the following  components:      Result Value   Abs Immature Granulocytes 0.12 (*)    All other components within normal limits  COMPREHENSIVE METABOLIC PANEL - Abnormal; Notable for the following components:   Sodium 131 (*)    Chloride 93 (*)    Glucose, Bld 126 (*)    All other components within normal limits  LACTIC ACID, PLASMA - Abnormal; Notable for the following components:   Lactic Acid, Venous 3.5 (*)    All other components within normal limits  LACTIC ACID, PLASMA - Abnormal; Notable for the following components:   Lactic Acid, Venous 2.7 (*)    All other components within normal limits  URINALYSIS, ROUTINE W REFLEX MICROSCOPIC - Abnormal; Notable for the following components:   Protein, ur 100 (*)    All other components within normal limits  CULTURE, BLOOD (ROUTINE X 2)  CULTURE, BLOOD (ROUTINE X 2)  URINE CULTURE  MAGNESIUM    EKG EKG Interpretation Date/Time:  Sunday September 14 2023 15:15:17 EDT Ventricular Rate:  88 PR Interval:  192 QRS Duration:  78 QT Interval:  380 QTC Calculation: 460 R Axis:   63  Text Interpretation: Sinus rhythm Anterior infarct, old Confirmed by Eber Hong ([id number]) on 09/14/2023 4:28:18 PM  Radiology DG Chest 2 View Result Date: 09/14/2023 CLINICAL DATA:  Cough.  Shortness of breath. EXAM: CHEST - 2 VIEW COMPARISON:  09/10/2023. FINDINGS: Bilateral lung fields are clear. Bilateral costophrenic angles are clear. Stable cardio-mediastinal silhouette. No acute osseous abnormalities. The soft tissues are within normal limits. IMPRESSION: No active cardiopulmonary disease. Electronically Signed   By: Jules Schick M.D.   On: 09/14/2023 16:43    Procedures Procedures    Medications Ordered in ED Medications  irbesartan (AVAPRO) tablet 75 mg (75 mg Oral Given 09/14/23 1847)  amLODipine (NORVASC) tablet 10 mg (10 mg Oral Given 09/14/23 1847)  cefTRIAXone (ROCEPHIN) 1 g in sodium chloride 0.9 % 100 mL IVPB (1 g Intravenous New Bag/Given 09/14/23 1925)   labetalol (NORMODYNE) injection 10 mg (has no administration in time range)  hydrALAZINE (APRESOLINE) injection 10 mg (10 mg Intravenous Given 09/14/23 1847)  sodium chloride 0.9 % bolus 500 mL (500 mLs Intravenous New Bag/Given 09/14/23 1850)    ED Course/ Medical Decision Making/ A&P                                 Medical Decision Making Amount and/or Complexity of Data Reviewed Labs: ordered. Radiology: ordered. ECG/medicine tests: ordered.  Risk Prescription drug management. Decision regarding hospitalization.   The patient's vital signs reflect hypertension, she is not tachycardic tachypneic or febrile, she is not hypoxic, her lung sounds are unremarkable.  She is fairly severely fatigued and has a intense malaise although at this time she does not appear to be septic clinically.  Will obtain an x-ray and some labs to make sure she has not developed renal failure severe electrolyte abnormalities or dehydration.  The patient is agreeable to the plan.   Additional history obtained:  Additional history obtained from electronic medical record External records from outside source obtained and reviewed including office visits, recent testing including x-ray and labs   Lab Tests:  I Ordered, and personally interpreted labs.  The pertinent results include: Unremarkable CBC metabolic panel   Imaging Studies ordered:  I ordered imaging studies including chest x-ray I independently visualized and interpreted imaging which showed no acute findings I agree with the radiologist interpretation   Medicines ordered and prescription drug management:  I ordered medication including antihypertensives for her severe hypertension for severe hypertension Reevaluation of the patient after these medicines showed that the patient no significant improvement I have reviewed the patients home medicines and have made adjustments as needed   Problem List / ED Course:  I discussed the case  with Dr. Mariea Clonts who will admit the patient for the resistant severe hypertension, I do not see any signs of sepsis, unfortunately the lactate is severely elevated, after some IV fluids it started to come down but is still not normal   Social Determinants of Health:  None           Final Clinical Impression(s) / ED Diagnoses Final diagnoses:  Hypertensive urgency  Malaise    Rx / DC Orders ED Discharge Orders     None         Eber Hong, MD 09/14/23 1942

## 2023-09-14 NOTE — Assessment & Plan Note (Signed)
 Blood pressure elevated systolic up to 220 with wide pulse pressure.  No response to hydralazine in the ED. -Resume ARB- Irbesartan 75 and amlodipine 10 mg -Improvement in blood pressure with labetalol, continue 10 mg every 4 hours as needed

## 2023-09-14 NOTE — ED Triage Notes (Signed)
 Pt recently diagnosed with pneumonia and now c/o weakness and feeling like she is going pass out.

## 2023-09-14 NOTE — Progress Notes (Signed)
 Subjective:  Patient ID: [name], female    DOB: December 03, 1931  Age: 89 y.o. MRN: [id number]  CC:   Chief Complaint  Patient presents with   Follow-up    Flu/pneumonia, productive cough     HPI:  89 year old female presents for evaluation of the above. Recently has had influenza and CAP. Improving on antibiotic therapy. Is experiencing diarrhea from antibiotics but overall is doing okay. No fever.   Patient Active Problem List   Diagnosis Date Noted   CAP (community acquired pneumonia) 09/14/2023   Acute blood loss anemia 04/17/2023   Gastritis and gastroduodenitis 04/10/2023   Abdominal pain, chronic, epigastric 04/08/2023   Mesenteric artery stenosis (HCC) 04/07/2023   Type 2 diabetes mellitus (HCC) 03/10/2023   Acute diarrhea 11/30/2022   Hypertensive urgency 11/30/2022   Upper respiratory tract infection 11/30/2022   Diabetic neuropathy (HCC) 11/30/2022   S/P total knee arthroplasty, right 04/12/2022   Hypertension associated with type 2 diabetes mellitus (HCC) 02/25/2022   Aortic atherosclerosis (HCC) 02/25/2022   Chronic non-seasonal allergic rhinitis 02/25/2022   Protein-calorie malnutrition, severe (HCC) 02/25/2022   Chronic anxiety 02/25/2022   Pneumonia 02/17/2022   GERD (gastroesophageal reflux disease) 02/17/2022   Myalgia due to statin 02/15/2020   Cognitive dysfunction 03/04/2019   Generalized anxiety disorder 06/05/2018   Floaters in visual field, bilateral 03/23/2018   Living in assisted living 03/23/2018   C. difficile diarrhea 03/03/2018   Osteoarthritis of right knee 12/02/2017   Hyperlipidemia associated with type 2 diabetes mellitus (HCC) 10/02/2016   Pedal edema 10/02/2016   Insomnia 10/02/2016   SBO (small bowel obstruction) (HCC) 07/02/2016   Dyspnea on exertion 02/24/2016   Essential hypertension 12/15/2014   Type 2 diabetes mellitus with atherosclerosis of aorta (HCC) 02/15/2011    Social Hx   Social History   Socioeconomic History    Marital status: Widowed    Spouse name: Not on file   Number of children: Not on file   Years of education: Not on file   Highest education level: Not on file  Occupational History   Not on file  Tobacco Use   Smoking status: Never   Smokeless tobacco: Never  Vaping Use   Vaping status: Never Used  Substance and Sexual Activity   Alcohol use: No   Drug use: No   Sexual activity: Never    Birth control/protection: Abstinence  Other Topics Concern   Not on file  Social History Narrative   Not on file   Social Drivers of Health   Financial Resource Strain: Low Risk  (09/06/2022)   Overall Financial Resource Strain (CARDIA)    Difficulty of Paying Living Expenses: Not hard at all  Food Insecurity: No Food Insecurity (04/08/2023)   Hunger Vital Sign    Worried About Running Out of Food in the Last Year: Never true    Ran Out of Food in the Last Year: Never true  Transportation Needs: No Transportation Needs (04/08/2023)   PRAPARE - Administrator, Civil Service (Medical): No    Lack of Transportation (Non-Medical): No  Physical Activity: Sufficiently Active (09/06/2022)   Exercise Vital Sign    Days of Exercise per Week: 5 days    Minutes of Exercise per Session: 30 min  Stress: No Stress Concern Present (09/06/2022)   [employer] of Occupational Health - Occupational Stress Questionnaire    Feeling of Stress : Not at all  Social Connections: Moderately Isolated (09/06/2022)   Social  Connection and Isolation Panel [NHANES]    Frequency of Communication with Friends and Family: More than three times a week    Frequency of Social Gatherings with Friends and Family: More than three times a week    Attends Religious Services: 1 to 4 times per year    Active Member of [employer] or Organizations: No    Attends Banker Meetings: Never    Marital Status: Widowed    Review of Systems Per HPI  Objective:  BP (!) 144/68   Pulse 95   Temp 98.2 F (36.8 C)    Ht 5\' 8"  (1.727 m)   Wt 165 lb (74.8 kg)   SpO2 95%   BMI 25.09 kg/m      09/14/2023    9:49 PM 09/14/2023    9:00 PM 09/14/2023    8:45 PM  BP/Weight  Systolic BP 178 186 194  Diastolic BP 58 63 64    Physical Exam Vitals and nursing note reviewed.  Constitutional:      General: She is not in acute distress. HENT:     Head: Normocephalic and atraumatic.  Eyes:     General:        Right eye: No discharge.        Left eye: No discharge.     Conjunctiva/sclera: Conjunctivae normal.  Cardiovascular:     Rate and Rhythm: Normal rate and regular rhythm.  Pulmonary:     Effort: Pulmonary effort is normal.     Breath sounds: Wheezing present.  Neurological:     Mental Status: She is alert.  Psychiatric:        Mood and Affect: Mood normal.        Behavior: Behavior normal.     Lab Results  Component Value Date   WBC 8.0 09/14/2023   HGB 12.9 09/14/2023   HCT 38.6 09/14/2023   PLT 344 09/14/2023   GLUCOSE 126 (H) 09/14/2023   CHOL 220 (H) 08/01/2023   TRIG 257 (H) 08/01/2023   HDL 54 08/01/2023   LDLCALC 121 (H) 08/01/2023   ALT 17 09/14/2023   AST 23 09/14/2023   NA 131 (L) 09/14/2023   K 4.1 09/14/2023   CL 93 (L) 09/14/2023   CREATININE 0.76 09/14/2023   BUN 15 09/14/2023   CO2 24 09/14/2023   TSH 1.161 04/08/2023   INR 1.1 04/11/2023   HGBA1C 6.6 (H) 08/01/2023   MICROALBUR 1.51 11/18/2013     Assessment & Plan:  Community acquired pneumonia, unspecified laterality Assessment & Plan: Overall, doing okay. Wheezing on exam. Albuterol as directed. Advised to finish antibiotic course.  Recommend probiotic and/or Yogurt due to antibiotic associated diarrhea. Recommended follow up with Dr. Lorin Picket in 3-4 weeks for re-evaluation and repeat imaging.   Other orders -     Albuterol Sulfate HFA; Inhale 2 puffs into the lungs every 6 (six) hours as needed for wheezing or shortness of breath.  Dispense: 8 g; Refill: 0    Follow-up:  3-5 weeks.  Everlene Other  DO [facility]

## 2023-09-15 ENCOUNTER — Encounter (HOSPITAL_COMMUNITY): Payer: Self-pay | Admitting: Internal Medicine

## 2023-09-15 DIAGNOSIS — E871 Hypo-osmolality and hyponatremia: Secondary | ICD-10-CM | POA: Diagnosis present

## 2023-09-15 DIAGNOSIS — I10 Essential (primary) hypertension: Secondary | ICD-10-CM | POA: Diagnosis present

## 2023-09-15 DIAGNOSIS — R5381 Other malaise: Secondary | ICD-10-CM | POA: Diagnosis present

## 2023-09-15 DIAGNOSIS — Z90710 Acquired absence of both cervix and uterus: Secondary | ICD-10-CM | POA: Diagnosis not present

## 2023-09-15 DIAGNOSIS — R0609 Other forms of dyspnea: Secondary | ICD-10-CM | POA: Diagnosis not present

## 2023-09-15 DIAGNOSIS — Z8249 Family history of ischemic heart disease and other diseases of the circulatory system: Secondary | ICD-10-CM | POA: Diagnosis not present

## 2023-09-15 DIAGNOSIS — E1159 Type 2 diabetes mellitus with other circulatory complications: Secondary | ICD-10-CM | POA: Diagnosis not present

## 2023-09-15 DIAGNOSIS — I160 Hypertensive urgency: Secondary | ICD-10-CM | POA: Diagnosis present

## 2023-09-15 DIAGNOSIS — E785 Hyperlipidemia, unspecified: Secondary | ICD-10-CM | POA: Diagnosis present

## 2023-09-15 DIAGNOSIS — Z7984 Long term (current) use of oral hypoglycemic drugs: Secondary | ICD-10-CM | POA: Diagnosis not present

## 2023-09-15 DIAGNOSIS — Z96651 Presence of right artificial knee joint: Secondary | ICD-10-CM | POA: Diagnosis present

## 2023-09-15 DIAGNOSIS — E1142 Type 2 diabetes mellitus with diabetic polyneuropathy: Secondary | ICD-10-CM | POA: Diagnosis present

## 2023-09-15 DIAGNOSIS — E869 Volume depletion, unspecified: Secondary | ICD-10-CM | POA: Diagnosis present

## 2023-09-15 DIAGNOSIS — R54 Age-related physical debility: Secondary | ICD-10-CM | POA: Diagnosis present

## 2023-09-15 DIAGNOSIS — E872 Acidosis, unspecified: Secondary | ICD-10-CM | POA: Diagnosis present

## 2023-09-15 DIAGNOSIS — Z79899 Other long term (current) drug therapy: Secondary | ICD-10-CM | POA: Diagnosis not present

## 2023-09-15 DIAGNOSIS — R531 Weakness: Secondary | ICD-10-CM | POA: Diagnosis not present

## 2023-09-15 DIAGNOSIS — I700 Atherosclerosis of aorta: Secondary | ICD-10-CM | POA: Diagnosis present

## 2023-09-15 DIAGNOSIS — Z66 Do not resuscitate: Secondary | ICD-10-CM | POA: Diagnosis present

## 2023-09-15 DIAGNOSIS — Z888 Allergy status to other drugs, medicaments and biological substances status: Secondary | ICD-10-CM | POA: Diagnosis not present

## 2023-09-15 DIAGNOSIS — E860 Dehydration: Secondary | ICD-10-CM | POA: Diagnosis present

## 2023-09-15 DIAGNOSIS — Z833 Family history of diabetes mellitus: Secondary | ICD-10-CM | POA: Diagnosis not present

## 2023-09-15 LAB — GLUCOSE, CAPILLARY
Glucose-Capillary: 125 mg/dL — ABNORMAL HIGH (ref 70–99)
Glucose-Capillary: 136 mg/dL — ABNORMAL HIGH (ref 70–99)
Glucose-Capillary: 147 mg/dL — ABNORMAL HIGH (ref 70–99)
Glucose-Capillary: 182 mg/dL — ABNORMAL HIGH (ref 70–99)

## 2023-09-15 LAB — C DIFFICILE QUICK SCREEN W PCR REFLEX
C Diff antigen: NEGATIVE
C Diff interpretation: NOT DETECTED
C Diff toxin: NEGATIVE

## 2023-09-15 LAB — URINE CULTURE: Culture: NO GROWTH

## 2023-09-15 LAB — BASIC METABOLIC PANEL
Anion gap: 9 (ref 5–15)
BUN: 12 mg/dL (ref 8–23)
CO2: 25 mmol/L (ref 22–32)
Calcium: 9 mg/dL (ref 8.9–10.3)
Chloride: 99 mmol/L (ref 98–111)
Creatinine, Ser: 0.66 mg/dL (ref 0.44–1.00)
GFR, Estimated: >60 mL/min (ref >60)
Glucose, Bld: 136 mg/dL — ABNORMAL HIGH (ref 70–99)
Potassium: 3.7 mmol/L (ref 3.5–5.1)
Sodium: 133 mmol/L — ABNORMAL LOW (ref 135–145)

## 2023-09-15 MED ORDER — CYCLOSPORINE 0.05 % OP EMUL
1.0000 [drp] | Freq: Two times a day (BID) | OPHTHALMIC | Status: DC
  Administered 2023-09-15 – 2023-09-18 (×6): 1 [drp] via OPHTHALMIC
  Filled 2023-09-15 (×5): qty 30

## 2023-09-15 MED ORDER — SODIUM CHLORIDE 0.9 % IV SOLN: INTRAVENOUS | Status: AC

## 2023-09-15 MED ORDER — DICYCLOMINE HCL 10 MG PO CAPS: 10.0000 mg | ORAL_CAPSULE | Freq: Two times a day (BID) | ORAL | Status: DC | PRN

## 2023-09-15 MED ORDER — VITAMIN B-12 1000 MCG PO TABS
1000.0000 ug | ORAL_TABLET | Freq: Every day | ORAL | Status: DC
  Administered 2023-09-15 – 2023-09-17 (×3): 1000 ug via ORAL
  Filled 2023-09-15 (×3): qty 1

## 2023-09-15 MED ORDER — ASPIRIN 81 MG PO CHEW
81.0000 mg | CHEWABLE_TABLET | Freq: Every day | ORAL | Status: DC
  Administered 2023-09-15 – 2023-09-18 (×4): 81 mg via ORAL
  Filled 2023-09-15 (×4): qty 1

## 2023-09-15 MED ORDER — SERTRALINE HCL 50 MG PO TABS
25.0000 mg | ORAL_TABLET | Freq: Every day | ORAL | Status: DC
  Administered 2023-09-15 – 2023-09-18 (×4): 25 mg via ORAL
  Filled 2023-09-15 (×4): qty 1

## 2023-09-15 MED ORDER — FERROUS SULFATE 325 (65 FE) MG PO TABS
325.0000 mg | ORAL_TABLET | ORAL | Status: DC
  Administered 2023-09-16 – 2023-09-18 (×2): 325 mg via ORAL
  Filled 2023-09-15 (×3): qty 1

## 2023-09-15 MED ORDER — HYDRALAZINE HCL 20 MG/ML IJ SOLN
10.0000 mg | INTRAMUSCULAR | Status: DC | PRN
  Administered 2023-09-15: 10 mg via INTRAVENOUS
  Filled 2023-09-15: qty 1

## 2023-09-15 MED ORDER — LOPERAMIDE HCL 2 MG PO CAPS: 2.0000 mg | ORAL_CAPSULE | Freq: Four times a day (QID) | ORAL | Status: DC | PRN

## 2023-09-15 NOTE — Progress Notes (Signed)
 Mobility Specialist Progress Note:    09/15/23 1627  Mobility  Activity Ambulated with assistance in hallway;Dangled on edge of bed  Level of Assistance Standby assist, set-up cues, supervision of patient - no hands on  Assistive Device Front wheel walker  Distance Ambulated (ft) 75 ft  Range of Motion/Exercises Active;All extremities  Activity Response Tolerated well  Mobility Referral Yes  Mobility visit 1 Mobility  Mobility Specialist Start Time (ACUTE ONLY) 1605  Mobility Specialist Stop Time (ACUTE ONLY) 1630  Mobility Specialist Time Calculation (min) (ACUTE ONLY) 25 min   Pt received in bed, agreeable to mobility. Required supervision to stand and ambulate with RW. Tolerated well, asx throughout. Returned to room, left pt sitting EOB. Alarm on, all needs met.   Lawerance Bach Mobility Specialist Please contact via Special educational needs teacher or  Rehab office at [phone]

## 2023-09-15 NOTE — Progress Notes (Signed)
 Mobility Specialist Progress Note:    09/15/23 1140  Mobility  Activity Ambulated with assistance in hallway  Level of Assistance Contact guard assist, steadying assist  Assistive Device Front wheel walker  Distance Ambulated (ft) 60 ft  Range of Motion/Exercises Active;All extremities  Activity Response Tolerated well  Mobility Referral Yes  Mobility visit 1 Mobility  Mobility Specialist Start Time (ACUTE ONLY) 1120  Mobility Specialist Stop Time (ACUTE ONLY) 1140  Mobility Specialist Time Calculation (min) (ACUTE ONLY) 20 min   Pt received sitting EOB requesting assistance to get up. Required CGA to stand and SBA to ambulate with RW. Tolerated well, fatigued at EOS. Returned pt supine,alarm on. All needs met.   Lawerance Bach Mobility Specialist Please contact via Special educational needs teacher or  Rehab office at [phone]

## 2023-09-15 NOTE — TOC Initial Note (Signed)
 Transition of Care [facility]) - Initial/Assessment Note    Patient Details  Name: [name] MRN: [id number] Date of Birth: 05-Oct-1931  Transition of Care [facility]) CM/SW Contact:    Karn Cassis, LCSW Phone Number: 09/15/2023, 8:56 AM  Clinical Narrative: Pt admitted due to generalized weakness with recent influenza. Pt has been a resident at [facility] for almost 5 years. She has a walker and participates in outpatient PT at facility. Per Hospital doctor at [employer], okay to return. Pt's son confirms plan to return to [facility] when medically stable. TOC will follow.                    Expected Discharge Plan: Assisted Living Barriers to Discharge: Continued Medical Work up   Patient Goals and CMS Choice Patient states their goals for this hospitalization and ongoing recovery are:: return to ALF   Choice offered to / list presented to : Adult Children [employer] ownership interest in [facility].provided to::  (n/a)    Expected Discharge Plan and Services In-house Referral: Clinical Social Work   Post Acute Care Choice: Resumption of Svcs/PTA Provider Living arrangements for the past 2 months: Assisted Living Facility                                      Prior Living Arrangements/Services Living arrangements for the past 2 months: Assisted Living Facility Lives with:: Facility Resident Patient language and need for interpreter reviewed:: Yes Do you feel safe going back to the place where you live?: Yes      Need for Family Participation in Patient Care: Yes (Comment) Care giver support system in place?: Yes (comment) Current home services: DME (walker) Criminal Activity/Legal Involvement Pertinent to Current Situation/Hospitalization: No - Comment as needed  Activities of Daily Living   ADL Screening (condition at time of admission) Independently performs ADLs?: Yes (appropriate for developmental age) Is the patient deaf or have difficulty hearing?:  No Does the patient have difficulty seeing, even when wearing glasses/contacts?: No Does the patient have difficulty concentrating, remembering, or making decisions?: No  Permission Sought/Granted         Permission granted to share info w AGENCY: [facility]  Permission granted to share info w Relationship: ALF     Emotional Assessment         Alcohol / Substance Use: Not Applicable Psych Involvement: No (comment)  Admission diagnosis:  Malaise [R53.81] Hypertensive urgency [I16.0] Generalized weakness [R53.1] Patient Active Problem List   Diagnosis Date Noted   CAP (community acquired pneumonia) 09/14/2023   Generalized weakness 09/14/2023   Acute blood loss anemia 04/17/2023   Gastritis and gastroduodenitis 04/10/2023   Abdominal pain, chronic, epigastric 04/08/2023   Mesenteric artery stenosis (HCC) 04/07/2023   Type 2 diabetes mellitus (HCC) 03/10/2023   Acute diarrhea 11/30/2022   Hypertensive urgency 11/30/2022   Upper respiratory tract infection 11/30/2022   Diabetic neuropathy (HCC) 11/30/2022   S/P total knee arthroplasty, right 04/12/2022   Hypertension associated with type 2 diabetes mellitus (HCC) 02/25/2022   Aortic atherosclerosis (HCC) 02/25/2022   Chronic non-seasonal allergic rhinitis 02/25/2022   Protein-calorie malnutrition, severe (HCC) 02/25/2022   Chronic anxiety 02/25/2022   Pneumonia 02/17/2022   GERD (gastroesophageal reflux disease) 02/17/2022   Myalgia due to statin 02/15/2020   Cognitive dysfunction 03/04/2019   Generalized anxiety disorder 06/05/2018   Floaters in visual field, bilateral 03/23/2018   Living  in assisted living 03/23/2018   C. difficile diarrhea 03/03/2018   Osteoarthritis of right knee 12/02/2017   Hyperlipidemia associated with type 2 diabetes mellitus (HCC) 10/02/2016   Pedal edema 10/02/2016   Insomnia 10/02/2016   SBO (small bowel obstruction) (HCC) 07/02/2016   Dyspnea on exertion 02/24/2016   Essential  hypertension 12/15/2014   Type 2 diabetes mellitus with atherosclerosis of aorta (HCC) 02/15/2011   PCP:  Babs Sciara, MD Pharmacy:   [facility], [place] - [address] [place] [address] Phone: [phone] Fax: [phone]  [facility] [place], [place] - [address] [place] [address] Phone: [phone] Fax: [phone]     Social Drivers of Health (SDOH) Social History: SDOH Screenings   Food Insecurity: No Food Insecurity (04/08/2023)  Housing: Patient Declined (04/08/2023)  Transportation Needs: No Transportation Needs (04/08/2023)  Utilities: Not At Risk (04/08/2023)  Alcohol Screen: Low Risk  (09/06/2022)  Depression (PHQ2-9): Low Risk  (09/12/2023)  Financial Resource Strain: Low Risk  (09/06/2022)  Physical Activity: Sufficiently Active (09/06/2022)  Social Connections: Moderately Isolated (09/06/2022)  Stress: No Stress Concern Present (09/06/2022)  Tobacco Use: Low Risk  (09/15/2023)   SDOH Interventions:     Readmission Risk Interventions    04/08/2023    2:11 PM  Readmission Risk Prevention Plan  Transportation Screening Complete  PCP or Specialist Appt within 3-5 Days Not Complete  HRI or Home Care Consult Complete  Social Work Consult for Recovery Care Planning/Counseling Complete  Palliative Care Screening Not Applicable  Medication Review Oceanographer) Complete

## 2023-09-15 NOTE — Progress Notes (Signed)
 PROGRESS NOTE   [name]  ZOX:[id number] DOB: 1931-12-02 DOA: 09/14/2023 PCP: Babs Sciara, MD   Chief Complaint  Patient presents with   Weakness   Level of care: Med-Surg  Brief Admission History:  89 y.o. female with medical history significant for diabetes mellitus, hypertension, anxiety, C. difficile diarrhea.  Patient [facility] for reports of generalized weakness.  She was recently diagnosed with pneumonia on chest x-ray (minimal right middle lobe subsegmental atelectasis or infiltrate), and influenza infection-3/12, was started on antibiotics azithromycin and Augmentin, she has been compliant with.  She reports breathing has improved, but has persistent cough.  No fevers no chills, no vomiting.  She reports diarrhea since starting antibiotics, up to 4-5 times daily.  Reports remote history of C. difficile infection over 30 years ago.  Reports she is unable to walk due to generalized weakness, feeling drained, and cannot go back to facility.  No chest pain no lower extremity swelling.   ED Course: Temperature 98.3.  Heart rate 60s to 89.  Respiratory rate 16-22.  Blood pressure elevated up to 220 with a wide pulse pressure.  Normal WBC 8.   Lactic acidosis of 3.5> 2.7.  UA not suggestive of UTI.  Chest x-ray clear.   Assessment and Plan:  Generalized weakness Likely secondary to recent influenza infection, possibly some dehydration and now diarrhea.  Ambulates with walker at baseline.  Currently on antibiotics for possible bacterial co-infection with influenza diagnosed 3/12.   Lactic acidosis 3.5> 2.7.  WBC 8.  Not meeting criteria for sepsis. - Chest x-ray today no acute abnormality, UA not suggestive of infection. - Stool C. Difficile pending - agree with holding antibiotics for now - 500 mL bolus given, continue NS for now - PT eval requested for early ambulation   Hypertensive urgency Blood pressure elevated systolic up to 220 with wide  pulse pressure.  No response to hydralazine in the ED. -Resume ARB- Irbesartan 75 and amlodipine 10 mg -Improvement in blood pressure with labetalol, continue 10 mg every 4 hours as needed  Type 2 diabetes mellitus with atherosclerosis of aorta Controlled.  A1c 6.6. -Hold metformin - SSI- S CBG (last 3)  Recent Labs    09/14/23 2353 09/15/23 0730 09/15/23 1106  GLUCAP 137* 125* 136*   DVT prophylaxis: enoxaparin Code Status: DNR  Family Communication:  Disposition: anticipate return to ALF   Consultants:   Procedures:   Antimicrobials:    Subjective: Pt reports that she is weak and can't walk, she is willing to work with PT today.   Objective: Vitals:   09/14/23 2149 09/14/23 2200 09/14/23 2350 09/15/23 0426  BP: (!) 178/58 (!) 182/73 (!) 165/61 (!) 176/65  Pulse: 63 62 66 68  Resp: 18  17 17   Temp:   98.1 F (36.7 C) 98.1 F (36.7 C)  TempSrc:   Oral Oral  SpO2: 97% 96% 100% 95%  Weight:      Height:        Intake/Output Summary (Last 24 hours) at 09/15/2023 1404 Last data filed at 09/15/2023 0800 Gross per 24 hour  Intake 120 ml  Output --  Net 120 ml   Filed Weights   09/14/23 1510  Weight: 74.8 kg   Examination:  General exam: frail, elderly female, awake, alert, cooperative, NAD, Appears calm and comfortable  Respiratory system: Clear to auscultation. Respiratory effort normal. Cardiovascular system: normal S1 & S2 heard. No JVD, murmurs, rubs, gallops or clicks.  No pedal edema. Gastrointestinal system: Abdomen is nondistended, soft and nontender. No organomegaly or masses felt. Normal bowel sounds heard. Central nervous system: Alert and oriented. No focal neurological deficits. Extremities: Symmetric 5 x 5 power. Skin: No rashes, lesions or ulcers. Psychiatry: Judgement and insight appear normal. Mood & affect appropriate.   Data Reviewed: I have personally reviewed following labs and imaging studies  CBC: Recent Labs  Lab 09/10/23 1131  09/14/23 1638  WBC 8.9 8.0  NEUTROABS 6.2 5.5  HGB 13.5 12.9  HCT 39.7 38.6  MCV 85.9 87.7  PLT 319 344    Basic Metabolic Panel: Recent Labs  Lab 09/10/23 1131 09/14/23 1638 09/15/23 0358  NA 128* 131* 133*  K 4.6 4.1 3.7  CL 87* 93* 99  CO2 29 24 25   GLUCOSE 152* 126* 136*  BUN 12 15 12   CREATININE 0.77 0.76 0.66  CALCIUM 9.7 10.0 9.0  MG  --  1.8  --     CBG: Recent Labs  Lab 09/14/23 2353 09/15/23 0730 09/15/23 1106  GLUCAP 137* 125* 136*    Recent Results (from the past 240 hours)  C Difficile Quick Screen w PCR reflex     Status: None   Collection Time: 09/14/23 10:00 AM   Specimen: STOOL  Result Value Ref Range Status   C Diff antigen NEGATIVE NEGATIVE Final   C Diff toxin NEGATIVE NEGATIVE Final   C Diff interpretation No C. difficile detected.  Final    Comment: Performed at [facility], [address], [place], [place] [address]  Blood culture (routine x 2)     Status: None (Preliminary result)   Collection Time: 09/14/23  4:38 PM   Specimen: BLOOD  Result Value Ref Range Status   Specimen Description BLOOD BLOOD RIGHT ARM  Final   Special Requests NONE  Final   Culture   Final    NO GROWTH < 24 HOURS Performed at [facility], [address], [place], [place] [address]    Report Status PENDING  Incomplete  Blood culture (routine x 2)     Status: None (Preliminary result)   Collection Time: 09/14/23  5:21 PM   Specimen: BLOOD  Result Value Ref Range Status   Specimen Description BLOOD BLOOD LEFT ARM  Final   Special Requests   Final    BOTTLES DRAWN AEROBIC AND ANAEROBIC Blood Culture adequate volume   Culture   Final    NO GROWTH < 24 HOURS Performed at [facility], [address], [place], [place] [address]    Report Status PENDING  Incomplete    Radiology Studies: DG Chest 2 View Result Date: 09/14/2023 CLINICAL DATA:  Cough.  Shortness of breath. EXAM: CHEST - 2 VIEW COMPARISON:  09/10/2023. FINDINGS: Bilateral lung fields are clear.  Bilateral costophrenic angles are clear. Stable cardio-mediastinal silhouette. No acute osseous abnormalities. The soft tissues are within normal limits. IMPRESSION: No active cardiopulmonary disease. Electronically Signed   By: Jules Schick M.D.   On: 09/14/2023 16:43   Scheduled Meds:  amLODipine  10 mg Oral Daily   enoxaparin (LOVENOX) injection  40 mg Subcutaneous Q24H   insulin aspart  0-5 Units Subcutaneous QHS   insulin aspart  0-9 Units Subcutaneous TID WC   irbesartan  75 mg Oral Daily   Continuous Infusions:  sodium chloride 60 mL/hr at 09/15/23 0846    LOS: 0 days   Time spent: 44 mins  Christain Mcraney Laural Benes, MD How to contact the [facility] Attending or Consulting  provider 7A - 7P or covering provider during after hours 7P -7A, for this patient?  Check the care team in [facility] and look for a) attending/consulting TRH provider listed and b) the [facility] team listed Log into [email or web address] to find provider on call.  Locate the [facility] provider you are looking for under [facility] and page to a number that you can be directly reached. If you still have difficulty reaching the provider, please page the [facility] (Director on Call) for the Hospitalists listed on amion for assistance.  09/15/2023, 2:04 PM

## 2023-09-15 NOTE — Hospital Course (Signed)
 89 y.o. female with medical history significant for diabetes mellitus, hypertension, anxiety, C. difficile diarrhea.  Patient [facility] ED [facility] for reports of generalized weakness.  She was recently diagnosed with pneumonia on chest x-ray (minimal right middle lobe subsegmental atelectasis or infiltrate), and influenza infection-3/12, was started on antibiotics azithromycin and Augmentin, she has been compliant with.  She reports breathing has improved, but has persistent cough.  No fevers no chills, no vomiting.  She reports diarrhea since starting antibiotics, up to 4-5 times daily.  Reports remote history of C. difficile infection over 30 years ago.  Reports she is unable to walk due to generalized weakness, feeling drained, and cannot go back to facility.  No chest pain no lower extremity swelling.   ED Course: Temperature 98.3.  Heart rate 60s to 89.  Respiratory rate 16-22.  Blood pressure elevated up to 220 with a wide pulse pressure.  Normal WBC 8.   Lactic acidosis of 3.5> 2.7.  UA not suggestive of UTI.  Chest x-ray clear.

## 2023-09-15 NOTE — Plan of Care (Signed)

## 2023-09-15 NOTE — Progress Notes (Signed)
 PT Cancellation Note  Patient Details Name: [name] MRN: [id number] DOB: 12-10-1931   Cancelled Treatment:    Reason Eval/Treat Not Completed: PT screened, no needs identified, will sign off Pt ambulates w/ mobility tech without difficulty.   Placido Sou, PT, DPT 09/15/2023, 3:37 PM

## 2023-09-16 DIAGNOSIS — E1159 Type 2 diabetes mellitus with other circulatory complications: Secondary | ICD-10-CM | POA: Diagnosis not present

## 2023-09-16 DIAGNOSIS — I160 Hypertensive urgency: Secondary | ICD-10-CM | POA: Diagnosis not present

## 2023-09-16 DIAGNOSIS — I700 Atherosclerosis of aorta: Secondary | ICD-10-CM | POA: Diagnosis not present

## 2023-09-16 DIAGNOSIS — R531 Weakness: Secondary | ICD-10-CM | POA: Diagnosis not present

## 2023-09-16 LAB — GLUCOSE, CAPILLARY
Glucose-Capillary: 146 mg/dL — ABNORMAL HIGH (ref 70–99)
Glucose-Capillary: 110 mg/dL — ABNORMAL HIGH (ref 70–99)
Glucose-Capillary: 123 mg/dL — ABNORMAL HIGH (ref 70–99)
Glucose-Capillary: 115 mg/dL — ABNORMAL HIGH (ref 70–99)

## 2023-09-16 LAB — BASIC METABOLIC PANEL
Anion gap: 11 (ref 5–15)
BUN: 12 mg/dL (ref 8–23)
CO2: 23 mmol/L (ref 22–32)
Calcium: 9.3 mg/dL (ref 8.9–10.3)
Chloride: 99 mmol/L (ref 98–111)
Creatinine, Ser: 0.73 mg/dL (ref 0.44–1.00)
GFR, Estimated: >60 mL/min (ref >60)
Glucose, Bld: 141 mg/dL — ABNORMAL HIGH (ref 70–99)
Potassium: 4.1 mmol/L (ref 3.5–5.1)
Sodium: 133 mmol/L — ABNORMAL LOW (ref 135–145)

## 2023-09-16 MED ORDER — HYDRALAZINE HCL 25 MG PO TABS
25.0000 mg | ORAL_TABLET | Freq: Three times a day (TID) | ORAL | Status: DC
  Administered 2023-09-16 (×2): 25 mg via ORAL
  Filled 2023-09-16 (×2): qty 1

## 2023-09-16 MED ORDER — DOXEPIN HCL 10 MG PO CAPS
10.0000 mg | ORAL_CAPSULE | Freq: Every evening | ORAL | Status: DC | PRN
  Administered 2023-09-17 (×2): 10 mg via ORAL
  Filled 2023-09-16 (×3): qty 1

## 2023-09-16 MED ORDER — HYDRALAZINE HCL 25 MG PO TABS
50.0000 mg | ORAL_TABLET | Freq: Three times a day (TID) | ORAL | Status: DC
  Administered 2023-09-16 – 2023-09-18 (×6): 50 mg via ORAL
  Filled 2023-09-16 (×6): qty 2

## 2023-09-16 MED ORDER — ZINC OXIDE 40 % EX OINT
TOPICAL_OINTMENT | Freq: Three times a day (TID) | CUTANEOUS | Status: DC
  Filled 2023-09-16 (×2): qty 57

## 2023-09-16 NOTE — Plan of Care (Signed)

## 2023-09-16 NOTE — Plan of Care (Signed)

## 2023-09-16 NOTE — Progress Notes (Signed)
 LAC IV flushed with 10cc 0.9 NS for IV maintenance, RN aware

## 2023-09-16 NOTE — Progress Notes (Signed)
 PROGRESS NOTE   LATIVIA VELIE  OZH:[id number] DOB: [birth year] DOA: 09/14/2023 PCP: Babs Sciara, MD   Chief Complaint  Patient presents with   Weakness   Level of care: Med-Surg  Brief Admission History:  89 y.o. female with medical history significant for diabetes mellitus, hypertension, anxiety, C. difficile diarrhea.  Patient presented [facility] ED from [facility] for reports of generalized weakness.  She was recently diagnosed with pneumonia on chest x-ray (minimal right middle lobe subsegmental atelectasis or infiltrate), and influenza infection-3/12, was started on antibiotics azithromycin and Augmentin, she has been compliant with.  She reports breathing has improved, but has persistent cough.  No fevers no chills, no vomiting.  She reports diarrhea since starting antibiotics, up to 4-5 times daily.  Reports remote history of C. difficile infection over 30 years ago.  Reports she is unable to walk due to generalized weakness, feeling drained, and cannot go back to facility.  No chest pain no lower extremity swelling.   ED Course: Temperature 98.3.  Heart rate 60s to 89.  Respiratory rate 16-22.  Blood pressure elevated up to 220 with a wide pulse pressure.  Normal WBC 8.   Lactic acidosis of 3.5> 2.7.  UA not suggestive of UTI.  Chest x-ray clear.   Assessment and Plan:  Generalized weakness Likely secondary to recent influenza infection, possibly some dehydration and now diarrhea.  Ambulates with walker at baseline.  Currently on antibiotics for possible bacterial co-infection with influenza diagnosed 3/12.   Lactic acidosis 3.5> 2.7.  WBC 8.  Not meeting criteria for sepsis. - Chest x-ray today no acute abnormality, UA not suggestive of infection. - Stool C. Difficile pending - agree with holding antibiotics for now - 500 mL bolus given on arrival, completed 24 hours of IV hydration w/NS - PT eval requested for early ambulation  - cause of severe weakness has not been found,  check a TTE to rule out heart failure  Hypertensive urgency Blood pressure elevated systolic up to 220 with wide pulse pressure.  No response to hydralazine in the ED. -Resume ARB- Irbesartan 75 and amlodipine 10 mg -added hydralazine 25 mg TID, increased to 50 mg TID on 3/18  Type 2 diabetes mellitus with atherosclerosis of aorta Controlled.  A1c 6.6. -Hold metformin - SSI- S CBG (last 3)  Recent Labs    09/16/23 0735 09/16/23 1123 09/16/23 1614  GLUCAP 146* 110* 123*   DVT prophylaxis: enoxaparin Code Status: DNR  Family Communication:  Disposition: anticipate return to ALF   Consultants:   Procedures:   Antimicrobials:    Subjective: Pt says she continues to feel very weak, lack of energy, difficulty ambulating more than a few feet   Objective: Vitals:   09/16/23 1325 09/16/23 1402 09/16/23 1440 09/16/23 1504  BP: (!) 189/76 (!) 189/76 (!) 164/67 (!) 155/64  Pulse: 93  95 81  Resp:      Temp: 98.6 F (37 C)     TempSrc:      SpO2: 95%     Weight:      Height:        Intake/Output Summary (Last 24 hours) at 09/16/2023 1657 Last data filed at 09/16/2023 1230 Gross per 24 hour  Intake 1151.02 ml  Output --  Net 1151.02 ml   Filed Weights   09/14/23 1510  Weight: 74.8 kg   Examination:  General exam: frail, elderly female, awake, alert, cooperative, NAD, Appears calm and comfortable  Respiratory system: Clear to  auscultation. Respiratory effort normal. Cardiovascular system: normal S1 & S2 heard. No JVD, murmurs, rubs, gallops or clicks. No pedal edema. Gastrointestinal system: Abdomen is nondistended, soft and nontender. No organomegaly or masses felt. Normal bowel sounds heard. Central nervous system: Alert and oriented. No focal neurological deficits. Extremities: Symmetric 5 x 5 power. Skin: No rashes, lesions or ulcers. Psychiatry: Judgement and insight appear normal. Mood & affect appropriate.   Data Reviewed: I have personally reviewed  following labs and imaging studies  CBC: Recent Labs  Lab 09/10/23 1131 09/14/23 1638  WBC 8.9 8.0  NEUTROABS 6.2 5.5  HGB 13.5 12.9  HCT 39.7 38.6  MCV 85.9 87.7  PLT 319 344    Basic Metabolic Panel: Recent Labs  Lab 09/10/23 1131 09/14/23 1638 09/15/23 0358 09/16/23 0835  NA 128* 131* 133* 133*  K 4.6 4.1 3.7 4.1  CL 87* 93* 99 99  CO2 29 24 25 23   GLUCOSE 152* 126* 136* 141*  BUN 12 15 12 12   CREATININE 0.77 0.76 0.66 0.73  CALCIUM 9.7 10.0 9.0 9.3  MG  --  1.8  --   --     CBG: Recent Labs  Lab 09/15/23 1637 09/15/23 2030 09/16/23 0735 09/16/23 1123 09/16/23 1614  GLUCAP 147* 182* 146* 110* 123*    Recent Results (from the past 240 hours)  C Difficile Quick Screen w PCR reflex     Status: None   Collection Time: 09/14/23 10:00 AM   Specimen: STOOL  Result Value Ref Range Status   C Diff antigen NEGATIVE NEGATIVE Final   C Diff toxin NEGATIVE NEGATIVE Final   C Diff interpretation No C. difficile detected.  Final    Comment: Performed at [facility], [address], [place], [place] [address]  Blood culture (routine x 2)     Status: None (Preliminary result)   Collection Time: 09/14/23  4:38 PM   Specimen: BLOOD  Result Value Ref Range Status   Specimen Description BLOOD BLOOD RIGHT ARM  Final   Special Requests NONE  Final   Culture   Final    NO GROWTH 2 DAYS Performed at [facility], [address] [place], [place] [address]    Report Status PENDING  Incomplete  Urine Culture     Status: None   Collection Time: 09/14/23  5:20 PM   Specimen: Urine, Clean Catch  Result Value Ref Range Status   Specimen Description   Final    URINE, CLEAN CATCH Performed at [facility], [address], [place], [place] [address]    Special Requests   Final    NONE Performed at [facility], [address] [place], [place] [address]    Culture   Final    NO GROWTH Performed at [facility], [address] [place], [place] [address]    Report  Status 09/15/2023 FINAL  Final  Blood culture (routine x 2)     Status: None (Preliminary result)   Collection Time: 09/14/23  5:21 PM   Specimen: BLOOD  Result Value Ref Range Status   Specimen Description BLOOD BLOOD LEFT ARM  Final   Special Requests   Final    BOTTLES DRAWN AEROBIC AND ANAEROBIC Blood Culture adequate volume   Culture   Final    NO GROWTH 2 DAYS Performed at [facility], [address] [place], [place] [address]    Report Status PENDING  Incomplete    Radiology Studies: No results found.  Scheduled Meds:  amLODipine  10 mg Oral Daily   aspirin  81 mg Oral Daily   cyanocobalamin  1,000 mcg Oral QHS   cycloSPORINE  1 drop Both Eyes BID   enoxaparin (LOVENOX) injection  40 mg Subcutaneous Q24H   ferrous sulfate  325 mg Oral QODAY   hydrALAZINE  25 mg Oral Q8H   insulin aspart  0-9 Units Subcutaneous TID WC   irbesartan  75 mg Oral Daily   sertraline  25 mg Oral Daily   Continuous Infusions:   LOS: 1 day   Time spent: 47 mins  Gianah Batt Laural Benes, MD How to contact the [facility] Attending or Consulting provider 7A - 7P or covering provider during after hours 7P -7A, for this patient?  Check the care team in [facility] and look for a) attending/consulting TRH provider listed and b) the [facility] team listed Log into [email or web address] to find provider on call.  Locate the [facility] provider you are looking for under [facility] and page to a number that you can be directly reached. If you still have difficulty reaching the provider, please page the [facility] (Director on Call) for the Hospitalists listed on amion for assistance.  09/16/2023, 4:57 PM

## 2023-09-17 ENCOUNTER — Inpatient Hospital Stay (HOSPITAL_COMMUNITY)

## 2023-09-17 DIAGNOSIS — R0609 Other forms of dyspnea: Secondary | ICD-10-CM

## 2023-09-17 DIAGNOSIS — I160 Hypertensive urgency: Secondary | ICD-10-CM | POA: Diagnosis not present

## 2023-09-17 DIAGNOSIS — R531 Weakness: Secondary | ICD-10-CM | POA: Diagnosis not present

## 2023-09-17 LAB — BASIC METABOLIC PANEL
Anion gap: 10 (ref 5–15)
BUN: 20 mg/dL (ref 8–23)
CO2: 25 mmol/L (ref 22–32)
Calcium: 9.6 mg/dL (ref 8.9–10.3)
Chloride: 96 mmol/L — ABNORMAL LOW (ref 98–111)
Creatinine, Ser: 0.82 mg/dL (ref 0.44–1.00)
GFR, Estimated: >60 mL/min (ref >60)
Glucose, Bld: 117 mg/dL — ABNORMAL HIGH (ref 70–99)
Potassium: 4.2 mmol/L (ref 3.5–5.1)
Sodium: 131 mmol/L — ABNORMAL LOW (ref 135–145)

## 2023-09-17 LAB — TSH: TSH: 1.739 u[IU]/mL (ref 0.350–4.500)

## 2023-09-17 LAB — GLUCOSE, CAPILLARY
Glucose-Capillary: 129 mg/dL — ABNORMAL HIGH (ref 70–99)
Glucose-Capillary: 129 mg/dL — ABNORMAL HIGH (ref 70–99)
Glucose-Capillary: 134 mg/dL — ABNORMAL HIGH (ref 70–99)
Glucose-Capillary: 169 mg/dL — ABNORMAL HIGH (ref 70–99)

## 2023-09-17 LAB — ECHOCARDIOGRAM COMPLETE
AV Mean grad: 3 mmHg
AV Peak grad: 5.7 mmHg
Ao pk vel: 1.19 m/s
Area-P 1/2: 2.41 cm2
Height: 68 in
S' Lateral: 2.3 cm
Single Plane A4C EF: 69.6 %
Weight: 2638.47 [oz_av]

## 2023-09-17 LAB — T4, FREE: Free T4: 1.39 ng/dL — ABNORMAL HIGH (ref 0.61–1.12)

## 2023-09-17 LAB — CK: Total CK: 27 U/L — ABNORMAL LOW (ref 38–234)

## 2023-09-17 LAB — MAGNESIUM: Magnesium: 1.8 mg/dL (ref 1.7–2.4)

## 2023-09-17 LAB — FOLATE: Folate: 17.9 ng/mL (ref >5.9)

## 2023-09-17 LAB — D-DIMER, QUANTITATIVE: D-Dimer, Quant: 0.47 ug{FEU}/mL (ref 0.00–0.50)

## 2023-09-17 LAB — PROCALCITONIN: Procalcitonin: <0.1 ng/mL

## 2023-09-17 LAB — VITAMIN B12: Vitamin B-12: 1998 pg/mL — ABNORMAL HIGH (ref 180–914)

## 2023-09-17 MED ORDER — MAGNESIUM SULFATE 2 GM/50ML IV SOLN
2.0000 g | Freq: Once | INTRAVENOUS | Status: AC
Start: 2023-09-17 — End: 2023-09-17
  Administered 2023-09-17: 2 g via INTRAVENOUS
  Filled 2023-09-17: qty 50

## 2023-09-17 MED ORDER — SODIUM CHLORIDE 0.9 % IV SOLN: INTRAVENOUS | Status: AC

## 2023-09-17 NOTE — Plan of Care (Signed)
  Problem: Education: Goal: Knowledge of General Education information will improve Description: Including pain rating scale, medication(s)/side effects and non-pharmacologic comfort measures Outcome: Progressing   Problem: Nutritional: Goal: Maintenance of adequate nutrition will improve Outcome: Progressing   Problem: Skin Integrity: Goal: Risk for impaired skin integrity will decrease Outcome: Progressing   Problem: Tissue Perfusion: Goal: Adequacy of tissue perfusion will improve Outcome: Progressing

## 2023-09-17 NOTE — Progress Notes (Signed)
 Mobility Specialist Progress Note:    09/17/23 1140  Mobility  Activity Ambulated with assistance in hallway;Transferred from bed to chair  Level of Assistance Contact guard assist, steadying assist  Assistive Device Front wheel walker  Distance Ambulated (ft) 70 ft  Range of Motion/Exercises Active;All extremities  Activity Response Tolerated well  Mobility Referral Yes  Mobility visit 1 Mobility  Mobility Specialist Start Time (ACUTE ONLY) 1120  Mobility Specialist Stop Time (ACUTE ONLY) 1140  Mobility Specialist Time Calculation (min) (ACUTE ONLY) 20 min   Pt received in bed, requesting assistance to get up. Required CGA to stand and ambulate with RW. Tolerated well, pt stated they don't feel as good today. Returned to room and left in chair. NT at bedside, all needs met.   Lawerance Bach Mobility Specialist Please contact via Special educational needs teacher or  Rehab office at [phone]

## 2023-09-17 NOTE — Progress Notes (Signed)
 Mobility Specialist Progress Note:    09/17/23 1600  Mobility  Activity Ambulated with assistance in hallway;Transferred to/from [facility]  Level of Assistance Standby assist, set-up cues, supervision of patient - no hands on  Assistive Device Front wheel walker  Distance Ambulated (ft) 70 ft  Range of Motion/Exercises Active;All extremities  Activity Response Tolerated well  Mobility Referral Yes  Mobility visit 1 Mobility  Mobility Specialist Start Time (ACUTE ONLY) 1310  Mobility Specialist Stop Time (ACUTE ONLY) 1330  Mobility Specialist Time Calculation (min) (ACUTE ONLY) 20 min   Pt received in bed, eager for mobility. Required SBA to stand and ambulate with RW. Tolerated well, asx throughout. Returned to room, left pt supine with RN. All needs met.   Lawerance Bach Mobility Specialist Please contact via Special educational needs teacher or  Rehab office at [phone]

## 2023-09-17 NOTE — Progress Notes (Signed)
 PROGRESS NOTE  [name] ZOX:[id number] DOB: 09/15/1931 DOA: 09/14/2023 PCP: Babs Sciara, MD  Brief History:  89 y.o. female with medical history significant for diabetes mellitus, hypertension, anxiety, C. difficile diarrhea.  Patient [facility] ED from [facility] for reports of generalized weakness.  She was recently diagnosed with pneumonia on chest x-ray (minimal right middle lobe subsegmental atelectasis or infiltrate), and influenza infection-3/12, was started on antibiotics azithromycin and Augmentin, she has been compliant with.  She reports breathing has improved, but has persistent cough.  No fevers no chills, no vomiting.  She reports diarrhea since starting antibiotics, up to 4-5 times daily.  Reports remote history of C. difficile infection over 30 years ago.  Reports she is unable to walk due to generalized weakness, feeling drained, and cannot go back to facility.  No chest pain no lower extremity swelling.   ED Course: Temperature 98.3.  Heart rate 60s to 89.  Respiratory rate 16-22.  Blood pressure elevated up to 220 with a wide pulse pressure.  Normal WBC 8.   Lactic acidosis of 3.5> 2.7.  UA not suggestive of UTI.  Chest x-ray clear.   Assessment/Plan: Generalized weakness -secondary to recent influenza infection, possibly some dehydration   -Ambulates with walker at baseline.   -Finishedantibiotics for possible bacterial co-infection with influenza diagnosed 3/12.   Lactic acidosis 3.5> 2.7.  WBC 8.  Not meeting criteria for sepsis. - Chest x-ray today no acute abnormality, UA not suggestive of infection. - Stool C. Difficile--neg - agree with holding antibiotics for now--remains afebrile and hemodynamically stable - 500 mL bolus given on arrival, completed 24 hours of IV hydration w/NS - PT eval requested for early ambulation>>no needs -B12 -Folate -TSH -CK--27 -Mag--1.8   Hypertensive urgency Blood pressure elevated systolic up to 220  with wide pulse pressure.  -continue amlodipine and hydralazine -Resume ARB- Irbesartan 75 -added hydralazine 25 mg TID, increased to 50 mg TID on 3/18   Type 2 diabetes mellitus with atherosclerosis of aorta Controlled.  A1c 6.6. -Hold metformin - SSI- S  Hyponatremia -due to volume depletion and poor solute intake       Family Communication:   no Family at bedside  Consultants:  none  Code Status:  DNR  DVT Prophylaxis:  [place] Lovenox   Procedures: As Listed in Progress Note Above  Antibiotics: None        Subjective: Patient complains of generalized weakness.  She denies any headache, chest pain, nausea, vomiting, diarrhea, abdominal pain.  Her loose stool is improving.  There is no hematochezia or melena.  She has some shortness of breath with exertion.  Objective: Vitals:   09/16/23 2037 09/16/23 2119 09/17/23 0453 09/17/23 0526  BP: (!) 161/86 (!) 192/84 (!) 158/61 (!) 158/61  Pulse: 76 84 72   Resp: 19 19 16    Temp: 98 F (36.7 C) 98 F (36.7 C) 97.8 F (36.6 C)   TempSrc:  Oral Oral   SpO2: 97% 99% 94%   Weight:      Height:        Intake/Output Summary (Last 24 hours) at 09/17/2023 1215 Last data filed at 09/16/2023 1230 Gross per 24 hour  Intake 240 ml  Output --  Net 240 ml   Weight change:  Exam:  General:  Pt is alert, follows commands appropriately, not in acute distress HEENT: No icterus, No thrush, No neck mass, [place]/AT Cardiovascular: RRR, S1/S2,  no rubs, no gallops Respiratory: Bibasilar rales.  No wheezing.  Good air movement Abdomen: Soft/+BS, non tender, non distended, no guarding Extremities: No edema, No lymphangitis, No petechiae, No rashes, no synovitis   Data Reviewed: I have personally reviewed following labs and imaging studies Basic Metabolic Panel: Recent Labs  Lab 09/14/23 1638 09/15/23 0358 09/16/23 0835  NA 131* 133* 133*  K 4.1 3.7 4.1  CL 93* 99 99  CO2 24 25 23   GLUCOSE 126* 136* 141*  BUN 15 12 12    CREATININE 0.76 0.66 0.73  CALCIUM 10.0 9.0 9.3  MG 1.8  --   --    Liver Function Tests: Recent Labs  Lab 09/14/23 1638  AST 23  ALT 17  ALKPHOS 76  BILITOT 0.5  PROT 6.8  ALBUMIN 3.9   No results for input(s): "LIPASE", "AMYLASE" in the last 168 hours. No results for input(s): "AMMONIA" in the last 168 hours. Coagulation Profile: No results for input(s): "INR", "PROTIME" in the last 168 hours. CBC: Recent Labs  Lab 09/14/23 1638  WBC 8.0  NEUTROABS 5.5  HGB 12.9  HCT 38.6  MCV 87.7  PLT 344   Cardiac Enzymes: No results for input(s): "CKTOTAL", "CKMB", "CKMBINDEX", "TROPONINI" in the last 168 hours. BNP: Invalid input(s): "POCBNP" CBG: Recent Labs  Lab 09/16/23 1123 09/16/23 1614 09/16/23 2041 09/17/23 0734 09/17/23 1134  GLUCAP 110* 123* 115* 129* 129*   HbA1C: No results for input(s): "HGBA1C" in the last 72 hours. Urine analysis:    Component Value Date/Time   COLORURINE YELLOW 09/14/2023 1720   APPEARANCEUR CLEAR 09/14/2023 1720   APPEARANCEUR Clear 05/21/2023 1333   LABSPEC 1.013 09/14/2023 1720   PHURINE 8.0 09/14/2023 1720   GLUCOSEU NEGATIVE 09/14/2023 1720   HGBUR NEGATIVE 09/14/2023 1720   BILIRUBINUR NEGATIVE 09/14/2023 1720   BILIRUBINUR negative 08/25/2023 0913   BILIRUBINUR Negative 05/21/2023 1333   KETONESUR NEGATIVE 09/14/2023 1720   PROTEINUR 100 (A) 09/14/2023 1720   UROBILINOGEN 0.2 08/25/2023 0913   UROBILINOGEN 0.2 12/15/2014 1956   NITRITE NEGATIVE 09/14/2023 1720   LEUKOCYTESUR NEGATIVE 09/14/2023 1720   Sepsis Labs: @LABRCNTIP (procalcitonin:4,lacticidven:4) ) Recent Results (from the past 240 hours)  C Difficile Quick Screen w PCR reflex     Status: None   Collection Time: 09/14/23 10:00 AM   Specimen: STOOL  Result Value Ref Range Status   C Diff antigen NEGATIVE NEGATIVE Final   C Diff toxin NEGATIVE NEGATIVE Final   C Diff interpretation No C. difficile detected.  Final    Comment: Performed at [facility], [address] [place], [place] [address]  Blood culture (routine x 2)     Status: None (Preliminary result)   Collection Time: 09/14/23  4:38 PM   Specimen: BLOOD  Result Value Ref Range Status   Specimen Description BLOOD BLOOD RIGHT ARM  Final   Special Requests NONE  Final   Culture   Final    NO GROWTH 3 DAYS Performed at [facility], [address], [place], [place] [address]    Report Status PENDING  Incomplete  Urine Culture     Status: None   Collection Time: 09/14/23  5:20 PM   Specimen: Urine, Clean Catch  Result Value Ref Range Status   Specimen Description   Final    URINE, CLEAN CATCH Performed at [facility], [address], [place], [place] [address]    Special Requests   Final    NONE Performed at [facility], [address] [place], [place] [address]    Culture   Final    NO GROWTH Performed at [facility], [address], [place], [place] [address]    Report Status 09/15/2023 FINAL  Final  Blood culture (routine x 2)     Status: None (Preliminary result)   Collection Time: 09/14/23  5:21 PM   Specimen: BLOOD  Result Value Ref Range Status   Specimen Description BLOOD BLOOD LEFT ARM  Final   Special Requests   Final    BOTTLES DRAWN AEROBIC AND ANAEROBIC Blood Culture adequate volume   Culture   Final    NO GROWTH 3 DAYS Performed at [facility], [address] [place], [place] [address]    Report Status PENDING  Incomplete     Scheduled Meds:  amLODipine  10 mg Oral Daily   aspirin  81 mg Oral Daily   cyanocobalamin  1,000 mcg Oral QHS   cycloSPORINE  1 drop Both Eyes BID   enoxaparin (LOVENOX) injection  40 mg Subcutaneous Q24H   ferrous sulfate  325 mg Oral QODAY   hydrALAZINE  50 mg Oral Q8H   insulin aspart  0-9 Units Subcutaneous TID WC   irbesartan  75 mg Oral Daily   liver oil-zinc oxide   Topical TID   sertraline  25 mg Oral Daily   Continuous Infusions:  Procedures/Studies: DG Chest 2 View Result Date:  09/14/2023 CLINICAL DATA:  Cough.  Shortness of breath. EXAM: CHEST - 2 VIEW COMPARISON:  09/10/2023. FINDINGS: Bilateral lung fields are clear. Bilateral costophrenic angles are clear. Stable cardio-mediastinal silhouette. No acute osseous abnormalities. The soft tissues are within normal limits. IMPRESSION: No active cardiopulmonary disease. Electronically Signed   By: Jules Schick M.D.   On: 09/14/2023 16:43   DG Chest 2 View Result Date: 09/10/2023 CLINICAL DATA:  Shortness of breath. EXAM: CHEST - 2 VIEW COMPARISON:  July 24, 2023. FINDINGS: The heart size and mediastinal contours are within normal limits. Left lung is clear. Minimal right middle lobe subsegmental atelectasis or infiltrate is noted. The visualized skeletal structures are unremarkable. IMPRESSION: Minimal right middle lobe subsegmental atelectasis or infiltrate. Followup PA and lateral chest X-ray is recommended in 3-4 weeks following trial of antibiotic therapy to ensure resolution and exclude underlying malignancy. Electronically Signed   By: Lupita Raider M.D.   On: 09/10/2023 14:35    Catarina Hartshorn, DO  [facility]  If 7PM-7AM, please contact night-coverage [email or web address] Password [email or web address] 09/17/2023, 12:15 PM   LOS: 2 days

## 2023-09-17 NOTE — Progress Notes (Signed)
 PT Screen Note  Patient Details Name: [name] MRN: [id number] DOB: Oct 19, 1931   Cancelled Treatment:      PT order completed, no needs identified as pt was already screened and no acute PT needs identified.   Nelida Meuse 09/17/2023, 10:12 AM

## 2023-09-17 NOTE — Care Management Important Message (Signed)
 Important Message  Patient Details  Name: DELANCEY MORAES MRN: [id number] Date of Birth: 1931-07-09   Important Message Given:  N/A - LOS <3 / Initial given by admissions     Corey Harold 09/17/2023, 1:11 PM

## 2023-09-18 DIAGNOSIS — I160 Hypertensive urgency: Secondary | ICD-10-CM | POA: Diagnosis not present

## 2023-09-18 DIAGNOSIS — I700 Atherosclerosis of aorta: Secondary | ICD-10-CM | POA: Diagnosis not present

## 2023-09-18 DIAGNOSIS — E1159 Type 2 diabetes mellitus with other circulatory complications: Secondary | ICD-10-CM | POA: Diagnosis not present

## 2023-09-18 DIAGNOSIS — R531 Weakness: Secondary | ICD-10-CM | POA: Diagnosis not present

## 2023-09-18 LAB — BASIC METABOLIC PANEL
Anion gap: 9 (ref 5–15)
BUN: 18 mg/dL (ref 8–23)
CO2: 23 mmol/L (ref 22–32)
Calcium: 8.7 mg/dL — ABNORMAL LOW (ref 8.9–10.3)
Chloride: 100 mmol/L (ref 98–111)
Creatinine, Ser: 0.71 mg/dL (ref 0.44–1.00)
GFR, Estimated: >60 mL/min (ref >60)
Glucose, Bld: 131 mg/dL — ABNORMAL HIGH (ref 70–99)
Potassium: 3.8 mmol/L (ref 3.5–5.1)
Sodium: 132 mmol/L — ABNORMAL LOW (ref 135–145)

## 2023-09-18 LAB — GLUCOSE, CAPILLARY
Glucose-Capillary: 146 mg/dL — ABNORMAL HIGH (ref 70–99)
Glucose-Capillary: 107 mg/dL — ABNORMAL HIGH (ref 70–99)

## 2023-09-18 LAB — MAGNESIUM: Magnesium: 2.1 mg/dL (ref 1.7–2.4)

## 2023-09-18 MED ORDER — HYDRALAZINE HCL 50 MG PO TABS: 50.0000 mg | ORAL_TABLET | Freq: Three times a day (TID) | ORAL | 2 refills | Status: DC

## 2023-09-18 NOTE — Care Management Important Message (Signed)
 Important Message  Patient Details  Name: [name] MRN: [id number] Date of Birth: [birth year]   Important Message Given:  Yes - Medicare IM     Corey Harold 09/18/2023, 3:35 PM

## 2023-09-18 NOTE — Discharge Summary (Signed)
 Physician Discharge Summary   Patient: [name] MRN: [id number] DOB: 18-May-1932  Admit date:     09/14/2023  Discharge date: 09/18/23  Discharge Physician: Onalee Hua Maye Parkinson   PCP: Babs Sciara, MD   Recommendations at discharge:   Please follow up with primary care provider within 1-2 weeks  Please repeat BMP and CBC in one week     Hospital Course: 89 y.o. female with medical history significant for diabetes mellitus, hypertension, anxiety, C. difficile diarrhea.  Patient presented to the ED from [facility] for reports of generalized weakness.  She was recently diagnosed with pneumonia on chest x-ray (minimal right middle lobe subsegmental atelectasis or infiltrate), and influenza infection-3/12, was started on antibiotics azithromycin and Augmentin, she has been compliant with.  She reports breathing has improved, but has persistent cough.  No fevers no chills, no vomiting.  She reports diarrhea since starting antibiotics, up to 4-5 times daily.  Reports remote history of C. difficile infection over 30 years ago.  Reports she is unable to walk due to generalized weakness, feeling drained, and cannot go back to facility.  No chest pain no lower extremity swelling.   ED Course: Temperature 98.3.  Heart rate 60s to 89.  Respiratory rate 16-22.  Blood pressure elevated up to 220 with a wide pulse pressure.  Normal WBC 8.   Lactic acidosis of 3.5> 2.7.  UA not suggestive of UTI.  Chest x-ray clear.  Assessment and Plan:  Generalized weakness -secondary to recent influenza infection, possibly some dehydration resulting in physical deconditioning   -Ambulates with walker at baseline.   -Finishedantibiotics for possible bacterial co-infection with influenza diagnosed 3/12.   Lactic acidosis 3.5> 2.7.  WBC 8.  Not meeting criteria for sepsis. - Chest x-ray today no acute abnormality, UA not suggestive of infection. - Stool C. Difficile--neg - agree with holding antibiotics for  now--remains afebrile and hemodynamically stable - 500 mL bolus given on arrival, completed 24 hours of IV hydration w/NS - PT eval requested for early ambulation>>no needs -B12--1998 -Folate--17.9 -TSH--1.739 -CK--27 -Mag--1.8 -pt ambulated 70 feet with assistance during hospitalization   Hypertensive urgency Blood pressure elevated systolic up to 220 with wide pulse pressure.  -continue amlodipine and hydralazine -Resume ARB- Irbesartan 75 -added hydralazine 25 mg TID, increased to 50 mg TID on 3/18   Type 2 diabetes mellitus with atherosclerosis of aorta Controlled.  A1c 6.6. -Hold metformin>>restart after d/c - SSI- S   Hyponatremia -due to volume depletion and poor solute intake          Consultants: none Procedures performed: none  Disposition: Assisted living Diet recommendation:  Carb modified diet DISCHARGE MEDICATION: Allergies as of 09/18/2023       Reactions   Hydrocortisone Itching   Lisinopril    Other reaction(s): Lethargy (intolerance)   Phenergan [promethazine Hcl] Other (See Comments)   hallucinations   Statins Other (See Comments)   Severe myalgias   Trazodone And Nefazodone Cough   Prednisone Rash        Medication List     STOP taking these medications    amoxicillin-clavulanate 875-125 MG tablet Commonly known as: AUGMENTIN   azithromycin 250 MG tablet Commonly known as: ZITHROMAX   benzonatate 100 MG capsule Commonly known as: TESSALON   gabapentin 100 MG capsule Commonly known as: NEURONTIN       TAKE these medications    acetaminophen 325 MG tablet Commonly known as: TYLENOL TAKE (2) TABLETS BY MOUTH EVERY SIX HOURS  AS NEEDED FOR PAIN. MAX 3GM IN 24 HOURS.   albuterol 108 (90 Base) MCG/ACT inhaler Commonly known as: VENTOLIN HFA Inhale 2 puffs into the lungs every 6 (six) hours as needed for wheezing or shortness of breath.   Allergy Relief 10 MG tablet Generic drug: loratadine Take 10 mg by mouth daily.    ALPRAZolam 0.25 MG tablet Commonly known as: XANAX TAKE (1) TABLET BY MOUTH AT BEDTIME AS NEEDED FOR SLEEP.   amLODipine 10 MG tablet Commonly known as: NORVASC Take 1 tablet (10 mg total) by mouth daily.   Aspirin Low Dose 81 MG chewable tablet Generic drug: aspirin CHEW (1) TABLET BY MOUTH ONCE DAILY.   Beta Carotene Provitamin A 95284 units capsule Generic drug: beta carotene Take 25,000 Units by mouth daily.   bisacodyl 5 MG EC tablet Generic drug: bisacodyl TAKE 1 TABLET BY MOUTH ONCE DAILY AS NEEDED FOR MODERATE CONSTIPATION. What changed: See the new instructions.   clotrimazole 1 % cream Commonly known as: LOTRIMIN Apply topically.   cyanocobalamin 1000 MCG tablet Take 1,000 mcg by mouth at bedtime.   dicyclomine 10 MG capsule Commonly known as: BENTYL TAKE 1 CAPSULE BY MOUTH TWICE DAILY AS NEEDED FOR ABDOMINAL CRAMPS AND LOOSE STOOL.   EasyMax Test test strip Generic drug: glucose blood CHECK BLOOD SUGAR ONCE DAILY.(CALL MD IF BS BELOW 60: OR IF BS ABOVE 400)   Fish Oil 1000 MG Caps One capsule BID What changed:  how much to take how to take this when to take this additional instructions   hydrALAZINE 50 MG tablet Commonly known as: APRESOLINE Take 1 tablet (50 mg total) by mouth every 8 (eight) hours.   Iron (Ferrous Sulfate) 325 (65 Fe) MG Tabs Take 325 mg by mouth every other day.   iVIZIA Dry Eyes 0.5 % Soln Generic drug: Povidone (PF) Place 1 drop into both eyes in the morning, at noon, in the evening, and at bedtime.   Melatonin 5 MG Caps Take 1 capsule (5 mg total) by mouth at bedtime.   metFORMIN 500 MG tablet Commonly known as: GLUCOPHAGE Take one tablet 500mg  po in the morning and 1/2 tablet 250 mg po at supper What changed:  how much to take how to take this when to take this   omeprazole 40 MG capsule Commonly known as: PRILOSEC Take 40 mg by mouth daily.   ondansetron 4 MG disintegrating tablet Commonly known as:  ZOFRAN-ODT 4mg  ODT q4 hours prn nausea/vomit   pantoprazole 40 MG tablet Commonly known as: PROTONIX Take 1 tablet (40 mg total) by mouth daily.   polyethylene glycol powder 17 GM/SCOOP powder Commonly known as: GoodSense ClearLax 1 scoop in 8 ounces water once daily as needed for constipation   PreserVision AREDS 2 Caps Take 1 capsule by mouth in the morning and at bedtime.   sertraline 25 MG tablet Commonly known as: ZOLOFT Take 1 tablet (25 mg total) by mouth daily.   sodium chloride 1 g tablet Take by mouth daily.   valsartan 160 MG tablet Commonly known as: DIOVAN Take 1 tablet (160 mg total) by mouth daily.   Vitamin D (Cholecalciferol) 10 MCG (400 UNIT) Tabs TAKE (1) TABLET BY MOUTH ONCE DAILY.        Discharge Exam: Filed Weights   09/14/23 1510  Weight: 74.8 kg   HEENT:  [place]/AT, No thrush, no icterus CV:  RRR, no rub, no S3, no S4 Lung:  CTA, no wheeze, no rhonchi Abd:  soft/+BS, NT  Ext:  No edema, no lymphangitis, no synovitis, no rash   Condition at discharge: stable  The results of significant diagnostics from this hospitalization (including imaging, microbiology, ancillary and laboratory) are listed below for reference.   Imaging Studies: ECHOCARDIOGRAM COMPLETE Result Date: 09/17/2023    ECHOCARDIOGRAM REPORT   Patient Name:   [name] Date of Exam: 09/17/2023 Medical Rec #:  [id number]       Height:       68.0 in Accession #:    [id number]      Weight:       164.9 lb Date of Birth:  06/07/1932       BSA:          1.883 m Patient Age:    89 years        BP:           158/61 mmHg Patient Gender: F               HR:           85 bpm. Exam Location:  Inpatient Procedure: 2D Echo, Cardiac Doppler and Color Doppler (Both Spectral and Color            Flow Doppler were utilized during procedure). Indications:    Dyspnea  History:        Patient has prior history of Echocardiogram examinations, most                 recent 01/23/2022. Risk  Factors:Hypertension and Diabetes.  Sonographer:    Amy Chionchio Referring Phys: [id number] CLANFORD L JOHNSON IMPRESSIONS  1. Left ventricular ejection fraction, by estimation, is 65 to 70%. The left ventricle has normal function. The left ventricle has no regional wall motion abnormalities. Left ventricular diastolic parameters are consistent with Grade I diastolic dysfunction (impaired relaxation).  2. Right ventricular systolic function is normal. The right ventricular size is normal. Tricuspid regurgitation signal is inadequate for assessing PA pressure.  3. Left atrial size was moderately dilated.  4. A small pericardial effusion is present. The pericardial effusion is posterior to the left ventricle.  5. The mitral valve is grossly normal. Trivial mitral valve regurgitation.  6. The aortic valve is tricuspid. Aortic valve regurgitation is not visualized. Aortic valve sclerosis/calcification is present, without any evidence of aortic stenosis. Aortic valve mean gradient measures 3.0 mmHg.  7. The inferior vena cava is normal in size with greater than 50% respiratory variability, suggesting right atrial pressure of 3 mmHg. Comparison(s): Prior images reviewed side by side. LVEF vigorous at 65-70%. FINDINGS  Left Ventricle: Left ventricular ejection fraction, by estimation, is 65 to 70%. The left ventricle has normal function. The left ventricle has no regional wall motion abnormalities. The left ventricular internal cavity size was normal in size. There is  no left ventricular hypertrophy. Left ventricular diastolic parameters are consistent with Grade I diastolic dysfunction (impaired relaxation). Right Ventricle: The right ventricular size is normal. Right vetricular wall thickness was not well visualized. Right ventricular systolic function is normal. Tricuspid regurgitation signal is inadequate for assessing PA pressure. Left Atrium: Left atrial size was moderately dilated. Right Atrium: Right atrial size was  normal in size. Pericardium: A small pericardial effusion is present. The pericardial effusion is posterior to the left ventricle. Presence of epicardial fat layer. Mitral Valve: The mitral valve is grossly normal. Mild mitral annular calcification. Trivial mitral valve regurgitation. MV peak gradient, 5.9 mmHg. The mean mitral valve gradient is 3.0 mmHg. Tricuspid Valve:  The tricuspid valve is not well visualized. Tricuspid valve regurgitation is trivial. Aortic Valve: The aortic valve is tricuspid. There is mild aortic valve annular calcification. Aortic valve regurgitation is not visualized. Aortic valve sclerosis/calcification is present, without any evidence of aortic stenosis. Aortic valve mean gradient measures 3.0 mmHg. Aortic valve peak gradient measures 5.7 mmHg. Pulmonic Valve: The pulmonic valve was grossly normal. Pulmonic valve regurgitation is trivial. Aorta: The aortic root and ascending aorta are structurally normal, with no evidence of dilitation. Venous: The inferior vena cava is normal in size with greater than 50% respiratory variability, suggesting right atrial pressure of 3 mmHg. IAS/Shunts: The interatrial septum was not well visualized. Additional Comments: 3D was performed not requiring image post processing on an independent workstation and was indeterminate.  LEFT VENTRICLE PLAX 2D LVIDd:         3.40 cm     Diastology LVIDs:         2.30 cm     LV e' medial:    4.90 cm/s LV PW:         0.90 cm     LV E/e' medial:  12.7 LV IVS:        1.00 cm     LV e' lateral:   4.57 cm/s LVOT diam:     1.90 cm     LV E/e' lateral: 13.6 LVOT Area:     2.84 cm  LV Volumes (MOD) LV vol d, MOD A4C: 71.8 ml LV vol s, MOD A4C: 21.8 ml LV SV MOD A4C:     71.8 ml RIGHT VENTRICLE         IVC TAPSE (M-mode): 2.7 cm  IVC diam: 1.70 cm LEFT ATRIUM           Index LA Vol (A4C): 87.7 ml 46.58 ml/m  AORTIC VALVE              PULMONIC VALVE AV Vmax:      119.00 cm/s PV Vmax:       1.05 m/s AV Vmean:     78.900 cm/s  PV Peak grad:  4.4 mmHg AV VTI:       0.214 m AV Peak Grad: 5.7 mmHg AV Mean Grad: 3.0 mmHg  AORTA Ao Root diam: 2.60 cm Ao Asc diam:  2.90 cm MITRAL VALVE MV Area (PHT): 2.41 cm     SHUNTS MV Peak grad:  5.9 mmHg     Systemic Diam: 1.90 cm MV Mean grad:  3.0 mmHg MV Vmax:       1.21 m/s MV Vmean:      76.3 cm/s MV Decel Time: 315 msec MV E velocity: 62.10 cm/s MV A velocity: 102.00 cm/s MV E/A ratio:  0.61 Nona Dell MD Electronically signed by Nona Dell MD Signature Date/Time: 09/17/2023/2:27:45 PM    Final    DG Chest 2 View Result Date: 09/14/2023 CLINICAL DATA:  Cough.  Shortness of breath. EXAM: CHEST - 2 VIEW COMPARISON:  09/10/2023. FINDINGS: Bilateral lung fields are clear. Bilateral costophrenic angles are clear. Stable cardio-mediastinal silhouette. No acute osseous abnormalities. The soft tissues are within normal limits. IMPRESSION: No active cardiopulmonary disease. Electronically Signed   By: Jules Schick M.D.   On: 09/14/2023 16:43   DG Chest 2 View Result Date: 09/10/2023 CLINICAL DATA:  Shortness of breath. EXAM: CHEST - 2 VIEW COMPARISON:  July 24, 2023. FINDINGS: The heart size and mediastinal contours are within normal limits. Left lung is clear. Minimal right middle lobe subsegmental atelectasis  or infiltrate is noted. The visualized skeletal structures are unremarkable. IMPRESSION: Minimal right middle lobe subsegmental atelectasis or infiltrate. Followup PA and lateral chest X-ray is recommended in 3-4 weeks following trial of antibiotic therapy to ensure resolution and exclude underlying malignancy. Electronically Signed   By: Lupita Raider M.D.   On: 09/10/2023 14:35    Microbiology: Results for orders placed or performed during the hospital encounter of 09/14/23  C Difficile Quick Screen w PCR reflex     Status: None   Collection Time: 09/14/23 10:00 AM   Specimen: STOOL  Result Value Ref Range Status   C Diff antigen NEGATIVE NEGATIVE Final   C Diff toxin  NEGATIVE NEGATIVE Final   C Diff interpretation No C. difficile detected.  Final    Comment: Performed at [facility], [address], [place], [place] [address]  Blood culture (routine x 2)     Status: None (Preliminary result)   Collection Time: 09/14/23  4:38 PM   Specimen: BLOOD  Result Value Ref Range Status   Specimen Description BLOOD BLOOD RIGHT ARM  Final   Special Requests NONE  Final   Culture   Final    NO GROWTH 4 DAYS Performed at [facility] [address] [place], [place] [address]    Report Status PENDING  Incomplete  Urine Culture     Status: None   Collection Time: 09/14/23  5:20 PM   Specimen: Urine, Clean Catch  Result Value Ref Range Status   Specimen Description   Final    URINE, CLEAN CATCH Performed at [facility], [address], [place], [place] [address]    Special Requests   Final    NONE Performed at [facility], [address] [place], [place] [address]    Culture   Final    NO GROWTH Performed at [facility], [address] [place], [place] [address]    Report Status 09/15/2023 FINAL  Final  Blood culture (routine x 2)     Status: None (Preliminary result)   Collection Time: 09/14/23  5:21 PM   Specimen: BLOOD  Result Value Ref Range Status   Specimen Description BLOOD BLOOD LEFT ARM  Final   Special Requests   Final    BOTTLES DRAWN AEROBIC AND ANAEROBIC Blood Culture adequate volume   Culture   Final    NO GROWTH 4 DAYS Performed at [facility], [address], [place], [place] [address]    Report Status PENDING  Incomplete    Labs: CBC: Recent Labs  Lab 09/14/23 1638  WBC 8.0  NEUTROABS 5.5  HGB 12.9  HCT 38.6  MCV 87.7  PLT 344   Basic Metabolic Panel: Recent Labs  Lab 09/14/23 1638 09/15/23 0358 09/16/23 0835 09/17/23 1200 09/18/23 0455  NA 131* 133* 133* 131* 132*  K 4.1 3.7 4.1 4.2 3.8  CL 93* 99 99 96* 100  CO2 24 25 23 25 23   GLUCOSE 126* 136* 141* 117* 131*  BUN 15 12 12 20 18   CREATININE 0.76  0.66 0.73 0.82 0.71  CALCIUM 10.0 9.0 9.3 9.6 8.7*  MG 1.8  --   --  1.8 2.1   Liver Function Tests: Recent Labs  Lab 09/14/23 1638  AST 23  ALT 17  ALKPHOS 76  BILITOT 0.5  PROT 6.8  ALBUMIN 3.9   CBG: Recent Labs  Lab 09/17/23 1134 09/17/23 1638 09/17/23 2128 09/18/23 0736 09/18/23 1114  GLUCAP 129* 134* 169* 146* 107*  Discharge time spent: greater than 30 minutes.  Signed: Catarina Hartshorn, MD [facility] 09/18/2023

## 2023-09-18 NOTE — TOC Transition Note (Signed)
 Transition of Care [facility]) - Discharge Note   Patient Details  Name: [name] MRN: [id number] Date of Birth: 1931/09/11  [facility] CM/SW Contact:  Elliot Gault, LCSW Phone Number: 09/18/2023, 1:39 PM   Clinical Narrative:     Pt medically stable for dc back to [facility] today per MD. Updated pt's son who is in agreement with dc plan.  Updated ALF staff and faxed dc clinical.   ALF will transport. No other TOC needs for dc.  Final next level of care: Assisted Living Barriers to Discharge: Barriers Resolved   Patient Goals and CMS Choice Patient states their goals for this hospitalization and ongoing recovery are:: return to ALF   Choice offered to / list presented to : Adult [place] ownership interest in [facility].provided to::  (n/a)    Discharge Placement                       Discharge Plan and Services Additional resources added to the After Visit Summary for   In-house Referral: Clinical Social Work   Post Acute Care Choice: Resumption of Svcs/PTA Provider                               Social Drivers of Health (SDOH) Interventions SDOH Screenings   Food Insecurity: No Food Insecurity (04/08/2023)  Housing: Patient Declined (04/08/2023)  Transportation Needs: No Transportation Needs (04/08/2023)  Utilities: Not At Risk (04/08/2023)  Alcohol Screen: Low Risk  (09/06/2022)  Depression (PHQ2-9): Low Risk  (09/12/2023)  Financial Resource Strain: Low Risk  (09/06/2022)  Physical Activity: Sufficiently Active (09/06/2022)  Social Connections: Moderately Isolated (09/06/2022)  Stress: No Stress Concern Present (09/06/2022)  Tobacco Use: Low Risk  (09/15/2023)     Readmission Risk Interventions    09/15/2023    3:04 PM 04/08/2023    2:11 PM  Readmission Risk Prevention Plan  Transportation Screening Complete Complete  PCP or Specialist Appt within 3-5 Days  Not Complete  HRI or Home Care Consult Complete Complete   Social Work Consult for Recovery Care Planning/Counseling Complete Complete  Palliative Care Screening Not Applicable Not Applicable  Medication Review Oceanographer) Complete Complete

## 2023-09-18 NOTE — [place] (Signed)
 [place] MEDICAID [place]2 LEVEL OF CARE FORM     IDENTIFICATION  Patient Name: [name] Birthdate: 1931-07-11 Sex: female Admission Date (Current Location): 09/14/2023  [facility] [place] Number:  [employer] and Address:  [facility],  [address], [place] [address]      Provider Number: [id number]  Attending Physician Name and Address:  Catarina Hartshorn, MD  Relative Name and Phone Number:       Current Level of Care: Hospital Recommended Level of Care: Assisted Living Facility Prior Approval Number:    Date Approved/Denied:   PASRR Number:    Discharge Plan:  (ALF)    Current Diagnoses: Patient Active Problem List   Diagnosis Date Noted   CAP (community acquired pneumonia) 09/14/2023   Generalized weakness 09/14/2023   Acute blood loss anemia 04/17/2023   Gastritis and gastroduodenitis 04/10/2023   Abdominal pain, chronic, epigastric 04/08/2023   Mesenteric artery stenosis (HCC) 04/07/2023   Type 2 diabetes mellitus (HCC) 03/10/2023   Acute diarrhea 11/30/2022   Hypertensive urgency 11/30/2022   Upper respiratory tract infection 11/30/2022   Diabetic neuropathy (HCC) 11/30/2022   S/P total knee arthroplasty, right 04/12/2022   Hypertension associated with type 2 diabetes mellitus (HCC) 02/25/2022   Aortic atherosclerosis (HCC) 02/25/2022   Chronic non-seasonal allergic rhinitis 02/25/2022   Protein-calorie malnutrition, severe (HCC) 02/25/2022   Chronic anxiety 02/25/2022   Pneumonia 02/17/2022   GERD (gastroesophageal reflux disease) 02/17/2022   Myalgia due to statin 02/15/2020   Cognitive dysfunction 03/04/2019   Generalized anxiety disorder 06/05/2018   Floaters in visual field, bilateral 03/23/2018   Living in assisted living 03/23/2018   C. difficile diarrhea 03/03/2018   Osteoarthritis of right knee 12/02/2017   Hyperlipidemia associated with type 2 diabetes mellitus (HCC) 10/02/2016   Pedal edema 10/02/2016   Insomnia  10/02/2016   SBO (small bowel obstruction) (HCC) 07/02/2016   Dyspnea on exertion 02/24/2016   Essential hypertension 12/15/2014   Type 2 diabetes mellitus with atherosclerosis of aorta (HCC) 02/15/2011    Orientation RESPIRATION BLADDER Height & Weight     Self, Time, Situation, Place  Normal Continent Weight: 164 lb 14.5 oz (74.8 kg) Height:  5\' 8"  (172.7 cm)  BEHAVIORAL SYMPTOMS/MOOD NEUROLOGICAL BOWEL NUTRITION STATUS      Continent Diet (reduced concentrated sweets, no added table salt)  AMBULATORY STATUS COMMUNICATION OF NEEDS Skin   Limited Assist Verbally Normal                       Personal Care Assistance Level of Assistance  Feeding, Bathing, Dressing Bathing Assistance: Limited assistance Feeding assistance: Limited assistance Dressing Assistance: Limited assistance     Functional Limitations Info  Sight, Hearing, Speech Sight Info: Adequate Hearing Info: Adequate Speech Info: Adequate    SPECIAL CARE FACTORS FREQUENCY                       Contractures      Additional Factors Info  Code Status, Allergies, Psychotropic, Isolation Precautions Code Status Info: DNR- Limited Allergies Info: Hydrocortisone  Lisinopril  Phenergan (Promethazine Hcl)  Statins  Trazodone And Nefazodone  Prednisone Psychotropic Info: Xanax, Zoloft   Isolation Precautions Info: Enteric precautions     Current Medications (09/18/2023):  This is the current hospital active medication list Current Facility-Administered Medications  Medication Dose Route Frequency Provider Last Rate Last Admin   acetaminophen (TYLENOL) tablet 650 mg  650 mg Oral Q6H PRN  Emokpae, Ejiroghene E, MD       Or   acetaminophen (TYLENOL) suppository 650 mg  650 mg Rectal Q6H PRN Emokpae, Ejiroghene E, MD       albuterol (PROVENTIL) (2.5 MG/3ML) 0.083% nebulizer solution 2.5 mg  2.5 mg Nebulization Q6H PRN Emokpae, Ejiroghene E, MD       ALPRAZolam (XANAX) tablet 0.25 mg  0.25 mg Oral QHS PRN  Emokpae, Ejiroghene E, MD   0.25 mg at 09/17/23 2148   amLODipine (NORVASC) tablet 10 mg  10 mg Oral Daily Emokpae, Ejiroghene E, MD   10 mg at 09/18/23 8119   aspirin chewable tablet 81 mg  81 mg Oral Daily Johnson, Clanford L, MD   81 mg at 09/18/23 1478   cyanocobalamin (VITAMIN B12) tablet 1,000 mcg  1,000 mcg Oral QHS Johnson, Clanford L, MD   1,000 mcg at 09/17/23 2151   cycloSPORINE (RESTASIS) 0.05 % ophthalmic emulsion 1 drop  1 drop Both Eyes BID Laural Benes, Clanford L, MD   1 drop at 09/18/23 2956   dicyclomine (BENTYL) capsule 10 mg  10 mg Oral BID PRN Laural Benes, Clanford L, MD       doxepin (SINEQUAN) capsule 10 mg  10 mg Oral QHS PRN Laural Benes, Clanford L, MD   10 mg at 09/17/23 2151   enoxaparin (LOVENOX) injection 40 mg  40 mg Subcutaneous Q24H Emokpae, Ejiroghene E, MD   40 mg at 09/18/23 2130   ferrous sulfate tablet 325 mg  325 mg Oral QODAY Johnson, Clanford L, MD   325 mg at 09/18/23 8657   hydrALAZINE (APRESOLINE) injection 10 mg  10 mg Intravenous Q4H PRN Laural Benes, Clanford L, MD   10 mg at 09/15/23 2134   hydrALAZINE (APRESOLINE) tablet 50 mg  50 mg Oral Q8H Johnson, Clanford L, MD   50 mg at 09/18/23 1347   insulin aspart (novoLOG) injection 0-9 Units  0-9 Units Subcutaneous TID WC Emokpae, Ejiroghene E, MD   1 Units at 09/18/23 8469   irbesartan (AVAPRO) tablet 75 mg  75 mg Oral Daily Emokpae, Ejiroghene E, MD   75 mg at 09/18/23 6295   labetalol (NORMODYNE) injection 10 mg  10 mg Intravenous Q4H PRN Emokpae, Ejiroghene E, MD       liver oil-zinc oxide (DESITIN) 40 % ointment   Topical TID Cleora Fleet, MD   Given at 09/18/23 2841   loperamide (IMODIUM) capsule 2 mg  2 mg Oral QID PRN Johnson, Clanford L, MD       ondansetron (ZOFRAN) tablet 4 mg  4 mg Oral Q6H PRN Emokpae, Ejiroghene E, MD       Or   ondansetron (ZOFRAN) injection 4 mg  4 mg Intravenous Q6H PRN Emokpae, Ejiroghene E, MD       sertraline (ZOLOFT) tablet 25 mg  25 mg Oral Daily Johnson, Clanford L, MD   25  mg at 09/18/23 3244     Discharge Medications: STOP taking these medications     amoxicillin-clavulanate 875-125 MG tablet Commonly known as: AUGMENTIN    azithromycin 250 MG tablet Commonly known as: ZITHROMAX    benzonatate 100 MG capsule Commonly known as: TESSALON    gabapentin 100 MG capsule Commonly known as: NEURONTIN           TAKE these medications     acetaminophen 325 MG tablet Commonly known as: TYLENOL TAKE (2) TABLETS BY MOUTH EVERY SIX HOURS AS NEEDED FOR PAIN. MAX 3GM IN 24 HOURS.    albuterol  108 (90 Base) MCG/ACT inhaler Commonly known as: VENTOLIN HFA Inhale 2 puffs into the lungs every 6 (six) hours as needed for wheezing or shortness of breath.    Allergy Relief 10 MG tablet Generic drug: loratadine Take 10 mg by mouth daily.    ALPRAZolam 0.25 MG tablet Commonly known as: XANAX TAKE (1) TABLET BY MOUTH AT BEDTIME AS NEEDED FOR SLEEP.    amLODipine 10 MG tablet Commonly known as: NORVASC Take 1 tablet (10 mg total) by mouth daily.    Aspirin Low Dose 81 MG chewable tablet Generic drug: aspirin CHEW (1) TABLET BY MOUTH ONCE DAILY.    Beta Carotene Provitamin A [address] units capsule Generic drug: beta carotene Take 25,000 Units by mouth daily.    bisacodyl 5 MG EC tablet Generic drug: bisacodyl TAKE 1 TABLET BY MOUTH ONCE DAILY AS NEEDED FOR MODERATE CONSTIPATION. What changed: See the new instructions.    clotrimazole 1 % cream Commonly known as: LOTRIMIN Apply topically.    cyanocobalamin 1000 MCG tablet Take 1,000 mcg by mouth at bedtime.    dicyclomine 10 MG capsule Commonly known as: BENTYL TAKE 1 CAPSULE BY MOUTH TWICE DAILY AS NEEDED FOR ABDOMINAL CRAMPS AND LOOSE STOOL.    EasyMax Test test strip Generic drug: glucose blood CHECK BLOOD SUGAR ONCE DAILY.(CALL MD IF BS BELOW 60: OR IF BS ABOVE 400)    Fish Oil 1000 MG Caps One capsule BID What changed:  how much to take how to take this when to take this additional  instructions    hydrALAZINE 50 MG tablet Commonly known as: APRESOLINE Take 1 tablet (50 mg total) by mouth every 8 (eight) hours.    Iron (Ferrous Sulfate) 325 (65 Fe) MG Tabs Take 325 mg by mouth every other day.    iVIZIA Dry Eyes 0.5 % Soln Generic drug: Povidone (PF) Place 1 drop into both eyes in the morning, at noon, in the evening, and at bedtime.    Melatonin 5 MG Caps Take 1 capsule (5 mg total) by mouth at bedtime.    metFORMIN 500 MG tablet Commonly known as: GLUCOPHAGE Take one tablet 500mg  po in the morning and 1/2 tablet 250 mg po at supper What changed:  how much to take how to take this when to take this    omeprazole 40 MG capsule Commonly known as: PRILOSEC Take 40 mg by mouth daily.    ondansetron 4 MG disintegrating tablet Commonly known as: ZOFRAN-ODT 4mg  ODT q4 hours prn nausea/vomit    pantoprazole 40 MG tablet Commonly known as: PROTONIX Take 1 tablet (40 mg total) by mouth daily.    polyethylene glycol powder 17 GM/SCOOP powder Commonly known as: GoodSense ClearLax 1 scoop in 8 ounces water once daily as needed for constipation    PreserVision AREDS 2 Caps Take 1 capsule by mouth in the morning and at bedtime.    sertraline 25 MG tablet Commonly known as: ZOLOFT Take 1 tablet (25 mg total) by mouth daily.    sodium chloride 1 g tablet Take by mouth daily.    valsartan 160 MG tablet Commonly known as: DIOVAN Take 1 tablet (160 mg total) by mouth daily.    Vitamin D (Cholecalciferol) 10 MCG (400 UNIT) Tabs TAKE (1) TABLET BY MOUTH ONCE DAILY.       Relevant Imaging Results:  Relevant Lab Results:   Additional Information Outpatient PT at facility.  Elliot Gault, LCSW

## 2023-09-18 NOTE — Progress Notes (Signed)
 Mobility Specialist Progress Note:    09/18/23 1035  Mobility  Activity Ambulated with assistance in hallway;Transferred to/from [facility];Transferred from bed to chair  Level of Assistance Contact guard assist, steadying assist  Assistive Device Front wheel walker  Distance Ambulated (ft) 70 ft  Range of Motion/Exercises Active;All extremities  Activity Response Tolerated well  Mobility Referral Yes  Mobility visit 1 Mobility  Mobility Specialist Start Time (ACUTE ONLY) 1015  Mobility Specialist Stop Time (ACUTE ONLY) 1035  Mobility Specialist Time Calculation (min) (ACUTE ONLY) 20 min   Pt received in bed, agreeable to mobility. Required CGA to stand and ambulate with RW. Tolerated well, asx throughout. Returned to room, left pt in chair. Call bell in hand, all needs met.  Lawerance Bach Mobility Specialist Please contact via Special educational needs teacher or  Rehab office at [phone]

## 2023-09-18 NOTE — Progress Notes (Signed)
 Pt rested well overnight. She reports she feels better this AM than yesterday. No acute events overnight. Wardell Heath Gerrianne Scale

## 2023-09-18 NOTE — Plan of Care (Signed)
   Problem: Education: Goal: Knowledge of General Education information will improve Description Including pain rating scale, medication(s)/side effects and non-pharmacologic comfort measures Outcome: Progressing   Problem: Health Behavior/Discharge Planning: Goal: Ability to manage health-related needs will improve Outcome: Progressing

## 2023-09-19 LAB — CULTURE, BLOOD (ROUTINE X 2)
Culture: NO GROWTH
Special Requests: ADEQUATE

## 2023-09-22 ENCOUNTER — Other Ambulatory Visit: Payer: Self-pay

## 2023-09-22 ENCOUNTER — Emergency Department (HOSPITAL_COMMUNITY)

## 2023-09-22 ENCOUNTER — Emergency Department (HOSPITAL_COMMUNITY)
Admission: EM | Admit: 2023-09-22 | Discharge: 2023-09-23 | Disposition: A | Discharge status: Home / Self Care | Attending: Emergency Medicine | Admitting: Emergency Medicine

## 2023-09-22 ENCOUNTER — Encounter (HOSPITAL_COMMUNITY): Payer: Self-pay | Admitting: Emergency Medicine

## 2023-09-22 DIAGNOSIS — R531 Weakness: Secondary | ICD-10-CM | POA: Diagnosis not present

## 2023-09-22 DIAGNOSIS — R0602 Shortness of breath: Secondary | ICD-10-CM | POA: Diagnosis not present

## 2023-09-22 DIAGNOSIS — K8020 Calculus of gallbladder without cholecystitis without obstruction: Secondary | ICD-10-CM | POA: Diagnosis not present

## 2023-09-22 DIAGNOSIS — K828 Other specified diseases of gallbladder: Secondary | ICD-10-CM | POA: Diagnosis not present

## 2023-09-22 DIAGNOSIS — Z7984 Long term (current) use of oral hypoglycemic drugs: Secondary | ICD-10-CM | POA: Diagnosis not present

## 2023-09-22 DIAGNOSIS — I10 Essential (primary) hypertension: Secondary | ICD-10-CM | POA: Diagnosis not present

## 2023-09-22 DIAGNOSIS — Z79899 Other long term (current) drug therapy: Secondary | ICD-10-CM | POA: Diagnosis not present

## 2023-09-22 DIAGNOSIS — Z7982 Long term (current) use of aspirin: Secondary | ICD-10-CM | POA: Insufficient documentation

## 2023-09-22 DIAGNOSIS — R6883 Chills (without fever): Secondary | ICD-10-CM | POA: Diagnosis present

## 2023-09-22 DIAGNOSIS — K5730 Diverticulosis of large intestine without perforation or abscess without bleeding: Secondary | ICD-10-CM | POA: Diagnosis not present

## 2023-09-22 DIAGNOSIS — E119 Type 2 diabetes mellitus without complications: Secondary | ICD-10-CM | POA: Diagnosis not present

## 2023-09-22 DIAGNOSIS — R2689 Other abnormalities of gait and mobility: Secondary | ICD-10-CM | POA: Diagnosis not present

## 2023-09-22 DIAGNOSIS — H353112 Nonexudative age-related macular degeneration, right eye, intermediate dry stage: Secondary | ICD-10-CM | POA: Diagnosis not present

## 2023-09-22 DIAGNOSIS — K7689 Other specified diseases of liver: Secondary | ICD-10-CM | POA: Diagnosis not present

## 2023-09-22 DIAGNOSIS — I700 Atherosclerosis of aorta: Secondary | ICD-10-CM | POA: Diagnosis not present

## 2023-09-22 LAB — RESP PANEL BY RT-PCR (RSV, FLU A&B, COVID)  RVPGX2
Influenza A by PCR: NEGATIVE
Influenza B by PCR: NEGATIVE
Resp Syncytial Virus by PCR: NEGATIVE
SARS Coronavirus 2 by RT PCR: NEGATIVE

## 2023-09-22 LAB — URINALYSIS, W/ REFLEX TO CULTURE (INFECTION SUSPECTED)
Bacteria, UA: NONE SEEN
Bilirubin Urine: NEGATIVE
Glucose, UA: NEGATIVE mg/dL
Hgb urine dipstick: NEGATIVE
Ketones, ur: NEGATIVE mg/dL
Leukocytes,Ua: NEGATIVE
Nitrite: NEGATIVE
Protein, ur: 100 mg/dL — AB
Specific Gravity, Urine: 1.009 (ref 1.005–1.030)
pH: 7 (ref 5.0–8.0)

## 2023-09-22 LAB — COMPREHENSIVE METABOLIC PANEL
ALT: 13 U/L (ref 0–44)
AST: 24 U/L (ref 15–41)
Albumin: 3.6 g/dL (ref 3.5–5.0)
Alkaline Phosphatase: 67 U/L (ref 38–126)
Anion gap: 13 (ref 5–15)
BUN: 13 mg/dL (ref 8–23)
CO2: 22 mmol/L (ref 22–32)
Calcium: 9.5 mg/dL (ref 8.9–10.3)
Chloride: 97 mmol/L — ABNORMAL LOW (ref 98–111)
Creatinine, Ser: 0.76 mg/dL (ref 0.44–1.00)
GFR, Estimated: >60 mL/min (ref >60)
Glucose, Bld: 133 mg/dL — ABNORMAL HIGH (ref 70–99)
Potassium: 4.2 mmol/L (ref 3.5–5.1)
Sodium: 132 mmol/L — ABNORMAL LOW (ref 135–145)
Total Bilirubin: 0.8 mg/dL (ref 0.0–1.2)
Total Protein: 6.7 g/dL (ref 6.5–8.1)

## 2023-09-22 LAB — CBC WITH DIFFERENTIAL/PLATELET
Abs Immature Granulocytes: 0.03 10*3/uL (ref 0.00–0.07)
Basophils Absolute: 0 10*3/uL (ref 0.0–0.1)
Basophils Relative: 1 %
Eosinophils Absolute: 0.1 10*3/uL (ref 0.0–0.5)
Eosinophils Relative: 1 %
HCT: 36.6 % (ref 36.0–46.0)
Hemoglobin: 12.1 g/dL (ref 12.0–15.0)
Immature Granulocytes: 0 %
Lymphocytes Relative: 33 %
Lymphs Abs: 2.4 10*3/uL (ref 0.7–4.0)
MCH: 29.2 pg (ref 26.0–34.0)
MCHC: 33.1 g/dL (ref 30.0–36.0)
MCV: 88.4 fL (ref 80.0–100.0)
Monocytes Absolute: 0.6 10*3/uL (ref 0.1–1.0)
Monocytes Relative: 8 %
Neutro Abs: 4.2 10*3/uL (ref 1.7–7.7)
Neutrophils Relative %: 57 %
Platelets: 363 10*3/uL (ref 150–400)
RBC: 4.14 MIL/uL (ref 3.87–5.11)
RDW: 14 % (ref 11.5–15.5)
WBC: 7.4 10*3/uL (ref 4.0–10.5)
nRBC: 0 % (ref 0.0–0.2)

## 2023-09-22 LAB — BRAIN NATRIURETIC PEPTIDE: B Natriuretic Peptide: 88 pg/mL (ref 0.0–100.0)

## 2023-09-22 LAB — CBG MONITORING, ED: Glucose-Capillary: 143 mg/dL — ABNORMAL HIGH (ref 70–99)

## 2023-09-22 LAB — TROPONIN I (HIGH SENSITIVITY): Troponin I (High Sensitivity): 14 ng/L (ref <18)

## 2023-09-22 MED ORDER — ALBUTEROL SULFATE HFA 108 (90 BASE) MCG/ACT IN AERS: 2.0000 | INHALATION_SPRAY | Freq: Four times a day (QID) | RESPIRATORY_TRACT | Status: DC | PRN

## 2023-09-22 MED ORDER — METFORMIN HCL 500 MG PO TABS: 500.0000 mg | ORAL_TABLET | Freq: Every day | ORAL | Status: DC

## 2023-09-22 MED ORDER — IRBESARTAN 150 MG PO TABS
150.0000 mg | ORAL_TABLET | Freq: Every day | ORAL | Status: DC
  Administered 2023-09-23: 150 mg via ORAL
  Filled 2023-09-22: qty 1

## 2023-09-22 MED ORDER — ASPIRIN 81 MG PO CHEW
81.0000 mg | CHEWABLE_TABLET | Freq: Every day | ORAL | Status: DC
  Administered 2023-09-23: 81 mg via ORAL
  Filled 2023-09-22: qty 1

## 2023-09-22 MED ORDER — ALBUTEROL SULFATE (2.5 MG/3ML) 0.083% IN NEBU: 2.5000 mg | INHALATION_SOLUTION | Freq: Four times a day (QID) | RESPIRATORY_TRACT | Status: DC | PRN

## 2023-09-22 MED ORDER — AMLODIPINE BESYLATE 5 MG PO TABS
10.0000 mg | ORAL_TABLET | Freq: Every day | ORAL | Status: DC
  Administered 2023-09-23: 10 mg via ORAL
  Filled 2023-09-22: qty 2

## 2023-09-22 MED ORDER — SERTRALINE HCL 50 MG PO TABS
25.0000 mg | ORAL_TABLET | Freq: Every day | ORAL | Status: DC
  Administered 2023-09-23: 25 mg via ORAL
  Filled 2023-09-22: qty 1

## 2023-09-22 MED ORDER — ACETAMINOPHEN 325 MG PO TABS: 650.0000 mg | ORAL_TABLET | Freq: Four times a day (QID) | ORAL | Status: DC | PRN

## 2023-09-22 MED ORDER — ALPRAZOLAM 0.5 MG PO TABS
0.5000 mg | ORAL_TABLET | Freq: Once | ORAL | Status: AC
  Administered 2023-09-22: 0.5 mg via ORAL
  Filled 2023-09-22: qty 1

## 2023-09-22 MED ORDER — ALPRAZOLAM 0.5 MG PO TABS: 0.2500 mg | ORAL_TABLET | Freq: Every evening | ORAL | Status: DC | PRN

## 2023-09-22 MED ORDER — SODIUM CHLORIDE 0.9 % IV BOLUS
500.0000 mL | Freq: Once | INTRAVENOUS | Status: AC
  Administered 2023-09-22: 500 mL via INTRAVENOUS

## 2023-09-22 MED ORDER — PANTOPRAZOLE SODIUM 40 MG PO TBEC
40.0000 mg | DELAYED_RELEASE_TABLET | Freq: Every day | ORAL | Status: DC
  Administered 2023-09-23: 40 mg via ORAL
  Filled 2023-09-22: qty 1

## 2023-09-22 MED ORDER — HYDRALAZINE HCL 25 MG PO TABS
50.0000 mg | ORAL_TABLET | Freq: Three times a day (TID) | ORAL | Status: DC
  Administered 2023-09-22 – 2023-09-23 (×3): 50 mg via ORAL
  Filled 2023-09-22 (×3): qty 2

## 2023-09-22 MED ORDER — ACETAMINOPHEN 325 MG PO TABS
650.0000 mg | ORAL_TABLET | Freq: Once | ORAL | Status: AC
  Administered 2023-09-22: 650 mg via ORAL
  Filled 2023-09-22: qty 2

## 2023-09-22 MED ORDER — IOHEXOL 300 MG/ML  SOLN
100.0000 mL | Freq: Once | INTRAMUSCULAR | Status: AC | PRN
  Administered 2023-09-22: 100 mL via INTRAVENOUS

## 2023-09-22 NOTE — ED Triage Notes (Signed)
 Pt c/o chills that started today and not feeling well.

## 2023-09-22 NOTE — ED Notes (Signed)
 Pt taken to bathroom in wheelchair. Pt back in bed and blankets given. Pt given night time medication and call bell is within reach. No other complaints or needs at this time.

## 2023-09-22 NOTE — Discharge Instructions (Signed)
 Your labs and imaging are reassuring. There are no acute findings causing your symptoms.  Follow up with your PCP in the next 3 days for reevaluation.   Get help right away if: You have sudden weakness on one side of your face or body. You have chest pain. You have trouble breathing or shortness of breath. You have problems with how you see (vision). You have trouble talking or swallowing. You have trouble standing or walking. You are light-headed or faint.

## 2023-09-22 NOTE — ED Provider Notes (Signed)
 Patient with recent diagnoses of pneumonia and influenza.  Recently hospitalized for generalized weakness.  She was discharged to assisted living facility.  Since that time, she has had worsening weakness, likely secondary to deconditioning.  She is no longer able to ambulate.  She requires assistance with even getting out of the bed.  Workup today is unremarkable.  Given her physical deconditioning, patient may need higher level of care.  Patient to remain in the ED tonight for physical therapy evaluation in the morning.  Home medications ordered.   Gloris Manchester, MD 09/22/23 [phone]

## 2023-09-22 NOTE — ED Notes (Signed)
 Patient transported to CT

## 2023-09-22 NOTE — ED Provider Notes (Signed)
 [place] EMERGENCY DEPARTMENT AT [facility] Provider Note   CSN: [id number] Arrival date & time: 09/22/23  1730     History  Chief Complaint  Patient presents with   Chills    [name] is a 89 y.o. female with a history of diabetes mellitus and hypertension presents to the ED today for chills.  Patient reports chills and generalized weakness onset worsening for the past 2 days.  Patient reports that she was discharged home from the hospital 4 days ago due to generalized weakness.  She recently was treated for both flu and pneumonia.  States that she was feeling about the same until 2 days ago when she started to feel worse and has difficulty ambulating and feeling more short of breath.  Denies any fevers, vomiting, or abdominal pain.  She did have 1 episode of diarrhea yesterday.  Denies any known sick contact at her ALF.  Patient states that today she was "praying for the Lord to take her." No additional complaints or concerns at this time.    Home Medications Prior to Admission medications   Medication Sig Start Date End Date Taking? Authorizing Provider  acetaminophen (TYLENOL) 325 MG tablet TAKE (2) TABLETS BY MOUTH EVERY SIX HOURS AS NEEDED FOR PAIN. MAX 3GM IN 24 HOURS. 07/28/23   Babs Sciara, MD  albuterol (VENTOLIN HFA) 108 (90 Base) MCG/ACT inhaler Inhale 2 puffs into the lungs every 6 (six) hours as needed for wheezing or shortness of breath. 09/12/23   Tommie Sams, DO  ALLERGY RELIEF 10 MG tablet Take 10 mg by mouth daily. 09/09/23   [provider]  ALPRAZolam (XANAX) 0.25 MG tablet TAKE (1) TABLET BY MOUTH AT BEDTIME AS NEEDED FOR SLEEP. 08/13/23   Babs Sciara, MD  amLODipine (NORVASC) 10 MG tablet Take 1 tablet (10 mg total) by mouth daily. 12/02/22   Shon Hale, MD  aspirin (ASPIRIN LOW DOSE) 81 MG chewable tablet CHEW (1) TABLET BY MOUTH ONCE DAILY. 09/06/23   Babs Sciara, MD  BETA CAROTENE PROVITAMIN A 95621 units capsule Take  25,000 Units by mouth daily. 01/01/23   [provider]  BISACODYL 5 MG EC tablet TAKE 1 TABLET BY MOUTH ONCE DAILY AS NEEDED FOR MODERATE CONSTIPATION. Patient taking differently: Take 5 mg by mouth daily as needed for moderate constipation. 12/20/22   Babs Sciara, MD  clotrimazole (LOTRIMIN) 1 % cream Apply topically. 05/01/23   [provider]  cyanocobalamin 1000 MCG tablet Take 1,000 mcg by mouth at bedtime.    [provider]  dicyclomine (BENTYL) 10 MG capsule TAKE 1 CAPSULE BY MOUTH TWICE DAILY AS NEEDED FOR ABDOMINAL CRAMPS AND LOOSE STOOL. Patient not taking: Reported on 09/15/2023 11/21/22   Babs Sciara, MD  [facility] TEST test strip CHECK BLOOD SUGAR ONCE DAILY.(CALL MD IF BS BELOW 60: OR IF BS ABOVE 400) 07/21/23   Luking, [name], MD  hydrALAZINE (APRESOLINE) 50 MG tablet Take 1 tablet (50 mg total) by mouth every 8 (eight) hours. 09/18/23   Catarina Hartshorn, MD  Iron, Ferrous Sulfate, 325 (65 Fe) MG TABS Take 325 mg by mouth every other day. 02/20/23   Babs Sciara, MD  IVIZIA DRY EYES 0.5 % SOLN Place 1 drop into both eyes in the morning, at noon, in the evening, and at bedtime. 03/27/23   [provider]  Melatonin 5 MG CAPS Take 1 capsule (5 mg total) by mouth at bedtime. 05/12/23  Babs Sciara, MD  metFORMIN (GLUCOPHAGE) 500 MG tablet Take one tablet 500mg  po in the morning and 1/2 tablet 250 mg po at supper Patient taking differently: Take 500 mg by mouth daily. Take one tablet 500mg  po in the morning and 1/2 tablet 250 mg po at supper 12/02/22   Shon Hale, MD  Multiple Vitamins-Minerals (PRESERVISION AREDS 2) CAPS Take 1 capsule by mouth in the morning and at bedtime.    [provider]  Omega-3 Fatty Acids (FISH OIL) 1000 MG CAPS One capsule BID Patient taking differently: Take 1 capsule by mouth in the morning and at bedtime. 08/07/21   Babs Sciara, MD  omeprazole (PRILOSEC) 40 MG capsule Take 40 mg by mouth daily. 05/01/23    [provider]  ondansetron (ZOFRAN-ODT) 4 MG disintegrating tablet 4mg  ODT q4 hours prn nausea/vomit 09/05/23   Bethann Berkshire, MD  pantoprazole (PROTONIX) 40 MG tablet Take 1 tablet (40 mg total) by mouth daily. 05/06/23   Babs Sciara, MD  polyethylene glycol powder (GOODSENSE CLEARLAX) 17 GM/SCOOP powder 1 scoop in 8 ounces water once daily as needed for constipation 06/03/23   Luking, Scott A, MD  sertraline (ZOLOFT) 25 MG tablet Take 1 tablet (25 mg total) by mouth daily. 05/06/23   Babs Sciara, MD  sodium chloride 1 g tablet Take by mouth daily. 05/01/23   [provider]  valsartan (DIOVAN) 160 MG tablet Take 1 tablet (160 mg total) by mouth daily. 08/25/23   Babs Sciara, MD  Vitamin D, Cholecalciferol, 10 MCG (400 UNIT) TABS TAKE (1) TABLET BY MOUTH ONCE DAILY. 05/09/23   Babs Sciara, MD      Allergies    Hydrocortisone, Lisinopril, Phenergan [promethazine hcl], Statins, Trazodone and nefazodone, and Prednisone    Review of Systems   Review of Systems  Constitutional:  Positive for chills.  All other systems reviewed and are negative.   Physical Exam Updated Vital Signs BP (!) 179/65   Pulse 77   Temp 98.2 F (36.8 C)   Resp 20   Ht 5\' 8"  (1.727 m)   Wt 74.8 kg   SpO2 99%   BMI 25.07 kg/m  Physical Exam Vitals and nursing note reviewed.  Constitutional:      General: She is not in acute distress.    Appearance: Normal appearance.  HENT:     Head: Normocephalic and atraumatic.     Mouth/Throat:     Mouth: Mucous membranes are moist.  Eyes:     Conjunctiva/sclera: Conjunctivae normal.     Pupils: Pupils are equal, round, and reactive to light.  Cardiovascular:     Rate and Rhythm: Normal rate and regular rhythm.     Pulses: Normal pulses.     Heart sounds: Normal heart sounds.  Pulmonary:     Effort: Pulmonary effort is normal.     Breath sounds: Normal breath sounds.  Abdominal:     Palpations: Abdomen is soft.     Tenderness:  There is abdominal tenderness.     Comments: Tenderness to the suprapubic area to palpation  Musculoskeletal:        General: Normal range of motion.     Cervical back: Normal range of motion.     Right lower leg: No edema.     Left lower leg: No edema.  Skin:    General: Skin is warm and dry.     Findings: No rash.  Neurological:     General: No  focal deficit present.     Mental Status: She is alert.     Sensory: No sensory deficit.     Motor: No weakness.  Psychiatric:        Mood and Affect: Mood normal.        Behavior: Behavior normal.    ED Results / Procedures / Treatments   Labs (all labs ordered are listed, but only abnormal results are displayed) Labs Reviewed  URINALYSIS, W/ REFLEX TO CULTURE (INFECTION SUSPECTED) - Abnormal; Notable for the following components:      Result Value   Color, Urine STRAW (*)    Protein, ur 100 (*)    All other components within normal limits  COMPREHENSIVE METABOLIC PANEL - Abnormal; Notable for the following components:   Sodium 132 (*)    Chloride 97 (*)    Glucose, Bld 133 (*)    All other components within normal limits  CBG MONITORING, ED - Abnormal; Notable for the following components:   Glucose-Capillary 143 (*)    All other components within normal limits  RESP PANEL BY RT-PCR (RSV, FLU A&B, COVID)  RVPGX2  CBC WITH DIFFERENTIAL/PLATELET  BRAIN NATRIURETIC PEPTIDE  TROPONIN I (HIGH SENSITIVITY)    EKG EKG Interpretation Date/Time:  Monday September 22 2023 19:59:43 EDT Ventricular Rate:  78 PR Interval:  201 QRS Duration:  110 QT Interval:  409 QTC Calculation: 466 R Axis:   49  Text Interpretation: Sinus rhythm Atrial premature complex Anterior infarct, old Confirmed by Gloris Manchester [phone]) on 09/22/2023 8:07:35 PM  Radiology CT ABDOMEN PELVIS W CONTRAST Result Date: 09/22/2023 CLINICAL DATA:  Abdominal pain EXAM: CT ABDOMEN AND PELVIS WITH CONTRAST TECHNIQUE: Multidetector CT imaging of the abdomen and pelvis was  performed using the standard protocol following bolus administration of intravenous contrast. RADIATION DOSE REDUCTION: This exam was performed according to the departmental dose-optimization program which includes automated exposure control, adjustment of the mA and/or kV according to patient size and/or use of iterative reconstruction technique. CONTRAST:  OMNIPAQUE IOHEXOL 300 MG/ML  SOLN COMPARISON:  04/10/2023 FINDINGS: Lower chest: Lung bases are free of acute infiltrate. Previously seen density in the medial right lung base is predominately related to a epicardial fat pad. Hepatobiliary: Gallbladder is well distended with multiple dependent stones. Liver is within normal limits with the exception of a tiny cyst in the right lobe of the liver. No biliary ductal abnormality is noted. Pancreas: Unremarkable. No pancreatic ductal dilatation or surrounding inflammatory changes. Spleen: Normal in size without focal abnormality. Adrenals/Urinary Tract: Adrenal glands are within normal limits. Kidneys demonstrate a normal enhancement pattern bilaterally. Scattered small cysts are noted. No follow-up is recommended. No renal calculi or obstructive changes are seen. The ureters are within normal limits. Bladder is well distended. Stomach/Bowel: Scattered diverticular change of the colon is noted without evidence of diverticulitis. The appendix has been surgically removed. Small bowel and stomach are within normal limits. Vascular/Lymphatic: Aortic atherosclerosis. No enlarged abdominal or pelvic lymph nodes. Reproductive: Status post hysterectomy. No adnexal masses. Other: Changes of prior ventral hernia repair are noted with mesh in place. No free fluid is noted. Musculoskeletal: No acute or significant osseous findings. IMPRESSION: Cholelithiasis without complicating factors. Diverticulosis without diverticulitis. No other focal abnormality is noted. Electronically Signed   By: Alcide Clever M.D.   On:  09/22/2023 20:34   DG Chest Portable 1 View Result Date: 09/22/2023 CLINICAL DATA:  Shortness of breath EXAM: PORTABLE CHEST 1 VIEW COMPARISON:  09/14/2023 FINDINGS: Cardiac shadow  is mildly prominent but stable from the prior exam. Aortic calcifications are seen. The lungs are well aerated bilaterally. Stable scarring is noted in the right lung base medially. Sizable effusion is noted. No bony abnormality is seen. IMPRESSION: No acute abnormality noted. Electronically Signed   By: Alcide Clever M.D.   On: 09/22/2023 20:31    Procedures Procedures: not indicated.   Medications Ordered in ED Medications  acetaminophen (TYLENOL) tablet 650 mg (has no administration in time range)  iohexol (OMNIPAQUE) 300 MG/ML solution 100 mL (100 mLs Intravenous Contrast Given 09/22/23 2010)  sodium chloride 0.9 % bolus 500 mL (500 mLs Intravenous New Bag/Given 09/22/23 2023)    ED Course/ Medical Decision Making/ A&P                                 Medical Decision Making Amount and/or Complexity of Data Reviewed Labs: ordered. Radiology: ordered.  Risk OTC drugs. Prescription drug management.   This patient presents to the ED for concern of chills, this involves an extensive number of treatment options, and is a complaint that carries with it a high risk of complications and morbidity.   Differential diagnosis includes: Flu, COVID, RSV, other viral illness, pneumonia, UTI, intra-abdominal infection, etc.   Comorbidities  See HPI above   Additional History  Additional history obtained from prior ED and hospitalization records.   Cardiac Monitoring / EKG  The patient was maintained on a cardiac monitor.  I personally viewed and interpreted the cardiac monitored which showed: sinus rhythm with PAC, heart rate of 73 bpm.   Lab Tests  I ordered and personally interpreted labs.  The pertinent results include:   CMP and CBC within normal limits UA is reassuring Negative respiratory  panel Troponin is negative BNP is reassuring   Imaging Studies  I ordered imaging studies including CXR and CT abdomen/pelvis  I independently visualized and interpreted imaging which showed:  CT abdomen pelvis shows cholelithiasis without complicating factors.  Diverticulosis without diverticulitis.  No other focal abnormality noted. Chest x-ray shows no acute abnormalities. I agree with the radiologist interpretation   Problem List / ED Course / Critical Interventions / Medication Management  Patient endorses chills and generalized weakness for the past 2 days.  She states that she has been having worsening shortness of breath and feels like she is in a pass out as well. Denies any fevers, cough, chest pain, abdominal pain or nausea or vomiting.  Did have 1 episode of diarrhea yesterday. Was discharged from the hospital 4 days ago after being admitted for the same complaint.  Denies any worsening weakness. I ordered medications including: Normal saline for generalized weakness Reevaluation of the patient after these medicines showed that the patient stayed the same.  I discussed findings with patient.  She states that she wants.  We have no acute findings to admit her for. Patient states that she "does not want to feel like this anymore."  Ordered Tylenol to try to help with fatigue. Patient staffed with my attending, Dr. Durwin Nora, who also saw patient. Patient's family is at bedside.  Spoke with them and discussed findings.  They are agreeable with discharge.   Social Determinants of Health  Living facility   Test / Admission - Considered  Patient is stable and safe for discharge home. Return precautions given.       Final Clinical Impression(s) / ED Diagnoses Final diagnoses:  Generalized weakness  Rx / DC Orders ED Discharge Orders     None         Maxwell Marion, PA-C 09/22/23 2157    Gloris Manchester, MD 09/22/23 [phone]

## 2023-09-23 ENCOUNTER — Inpatient Hospital Stay: Admitting: Family Medicine

## 2023-09-23 DIAGNOSIS — R531 Weakness: Secondary | ICD-10-CM | POA: Diagnosis not present

## 2023-09-23 NOTE — ED Notes (Addendum)
 Transition of Care [facility]) - Emergency Department Mini Assessment   Patient Details  Name: [name] MRN: [id number] Date of Birth: Nov 20, 1931  [facility]) CM/SW Contact:    Beather Arbour Phone Number: 09/23/2023, 10:59 AM   Clinical Narrative: CSW spoke with patient son regarding SNF consult. Son stated that he wants to do what ever is recommended. Patient has been at [facility] for 4/5 years and just started using a walker 6-9 months ago .  Before seeing what PT would recommend, CSW spoke with patient son about SNF options. Son chose [facility]. PT later on recommended HHPT.   According to PT, patient walked 150 feet.   Addendum 2:33 pm .   CSW called [facility] and spoke with [name] about PT recommendation for HHPT. [name] shared that they can accept patient back and pick her up . [name] also shared that they uses [facility] for PT and that this Clinical research associate did not need to do anything since pt recently DC on 3/20 and they will have her PCP order the HHPT. MD made aware about updating AVS. Patient made aware of plan. CSW did try to call son , but had to leave a confidential VM. TOC signing off.     ED Mini Assessment: What brought you to the Emergency Department? : Weakness  Barriers to Discharge: Barriers Resolved, Other (must enter comment) (PT recommended HHPT)  Barrier interventions: PT recommended HHPT  Means of departure: Not know  Interventions which prevented an admission or readmission: SNF Placement    Patient Contact and Communications Key Contact 1: Ileene Musa   Spoke with: Son Contact Date: 09/23/23,   Contact time: 0822 Contact Phone Number: [phone] Call outcome: Consult for SNF placement before PT seen  Patient states their goals for this hospitalization and ongoing recovery are:: Get stronger CMS Medicare.gov Compare Post Acute Care list provided to:: Patient Represenative (must comment) (Son Barbara Cower) Choice offered to / list  presented to : Adult Children  Admission diagnosis:  Chills Patient Active Problem List   Diagnosis Date Noted   CAP (community acquired pneumonia) 09/14/2023   Generalized weakness 09/14/2023   Acute blood loss anemia 04/17/2023   Gastritis and gastroduodenitis 04/10/2023   Abdominal pain, chronic, epigastric 04/08/2023   Mesenteric artery stenosis (HCC) 04/07/2023   Type 2 diabetes mellitus (HCC) 03/10/2023   Acute diarrhea 11/30/2022   Hypertensive urgency 11/30/2022   Upper respiratory tract infection 11/30/2022   Diabetic neuropathy (HCC) 11/30/2022   S/P total knee arthroplasty, right 04/12/2022   Hypertension associated with type 2 diabetes mellitus (HCC) 02/25/2022   Aortic atherosclerosis (HCC) 02/25/2022   Chronic non-seasonal allergic rhinitis 02/25/2022   Protein-calorie malnutrition, severe (HCC) 02/25/2022   Chronic anxiety 02/25/2022   Pneumonia 02/17/2022   GERD (gastroesophageal reflux disease) 02/17/2022   Myalgia due to statin 02/15/2020   Cognitive dysfunction 03/04/2019   Generalized anxiety disorder 06/05/2018   Floaters in visual field, bilateral 03/23/2018   Living in assisted living 03/23/2018   C. difficile diarrhea 03/03/2018   Osteoarthritis of right knee 12/02/2017   Hyperlipidemia associated with type 2 diabetes mellitus (HCC) 10/02/2016   Pedal edema 10/02/2016   Insomnia 10/02/2016   SBO (small bowel obstruction) (HCC) 07/02/2016   Dyspnea on exertion 02/24/2016   Essential hypertension 12/15/2014   Type 2 diabetes mellitus with atherosclerosis of aorta (HCC) 02/15/2011   PCP:  Babs Sciara, MD Pharmacy:   [facility] - [place], [place] - [address] [place]  [place] [address] Phone: [phone] Fax: [phone]  [facility] - [place], [place] - [address] [place] [address] Phone: [phone] Fax: [phone]

## 2023-09-23 NOTE — [place] FL2 (Deleted)
 [place] MEDICAID FL2 LEVEL OF CARE FORM     IDENTIFICATION  Patient Name: [name] Birthdate: 1931-10-29 Sex: female Admission Date (Current Location): 09/22/2023  [facility] and [place] Number:  [employer] and Address:  [facility],  [address] [place] [address]      Provider Number: [id number]  Attending Physician Name and Address:  Derwood Kaplan MD  Relative Name and Phone Number:  Ileene Musa (son) [phone]    Current Level of Care: Hospital Recommended Level of Care: Skilled Nursing Facility Prior Approval Number:    Date Approved/Denied:   PASRR Number: Pending  Discharge Plan: SNF    Current Diagnoses: Patient Active Problem List   Diagnosis Date Noted   CAP (community acquired pneumonia) 09/14/2023   Generalized weakness 09/14/2023   Acute blood loss anemia 04/17/2023   Gastritis and gastroduodenitis 04/10/2023   Abdominal pain, chronic, epigastric 04/08/2023   Mesenteric artery stenosis (HCC) 04/07/2023   Type 2 diabetes mellitus (HCC) 03/10/2023   Acute diarrhea 11/30/2022   Hypertensive urgency 11/30/2022   Upper respiratory tract infection 11/30/2022   Diabetic neuropathy (HCC) 11/30/2022   S/P total knee arthroplasty, right 04/12/2022   Hypertension associated with type 2 diabetes mellitus (HCC) 02/25/2022   Aortic atherosclerosis (HCC) 02/25/2022   Chronic non-seasonal allergic rhinitis 02/25/2022   Protein-calorie malnutrition, severe (HCC) 02/25/2022   Chronic anxiety 02/25/2022   Pneumonia 02/17/2022   GERD (gastroesophageal reflux disease) 02/17/2022   Myalgia due to statin 02/15/2020   Cognitive dysfunction 03/04/2019   Generalized anxiety disorder 06/05/2018   Floaters in visual field, bilateral 03/23/2018   Living in assisted living 03/23/2018   C. difficile diarrhea 03/03/2018   Osteoarthritis of right knee 12/02/2017   Hyperlipidemia associated with type 2 diabetes mellitus (HCC) 10/02/2016    Pedal edema 10/02/2016   Insomnia 10/02/2016   SBO (small bowel obstruction) (HCC) 07/02/2016   Dyspnea on exertion 02/24/2016   Essential hypertension 12/15/2014   Type 2 diabetes mellitus with atherosclerosis of aorta (HCC) 02/15/2011    Orientation RESPIRATION BLADDER Height & Weight     Self, Time, Situation, Place  Normal Continent Weight: 164 lb 14.5 oz (74.8 kg) Height:  5\' 8"  (172.7 cm)  BEHAVIORAL SYMPTOMS/MOOD NEUROLOGICAL BOWEL NUTRITION STATUS      Continent Diet  AMBULATORY STATUS COMMUNICATION OF NEEDS Skin   Extensive Assist Verbally Normal                       Personal Care Assistance Level of Assistance  Bathing, Feeding, Dressing Bathing Assistance: Maximum assistance Feeding assistance: Independent Dressing Assistance: Maximum assistance     Functional Limitations Info  Sight, Hearing, Speech Sight Info: Adequate (Wears eyeglasses) Hearing Info: Adequate Speech Info: Adequate    SPECIAL CARE FACTORS FREQUENCY  PT (By licensed PT)     PT Frequency: 5 x a week              Contractures Contractures Info: Not present    Additional Factors Info  Code Status, Allergies, Psychotropic Code Status Info: DNR limited Allergies Info: Hydrocortisone Lisinopril Phenergan (Promethazine Hcl) Statins Trazodone And Nefazodone Prednisone Psychotropic Info: Xanax and Zoloft         Current Medications (09/23/2023):  This is the current hospital active medication list Current Facility-Administered Medications  Medication Dose Route Frequency Provider Last Rate Last Admin   acetaminophen (TYLENOL) tablet 650 mg  650 mg Oral Q6H PRN Gloris Manchester, MD  albuterol (PROVENTIL) (2.5 MG/3ML) 0.083% nebulizer solution 2.5 mg  2.5 mg Nebulization Q6H PRN Gloris Manchester, MD       ALPRAZolam Prudy Feeler) tablet 0.25 mg  0.25 mg Oral QHS PRN Gloris Manchester, MD       amLODipine (NORVASC) tablet 10 mg  10 mg Oral Daily Gloris Manchester, MD   10 mg at 09/23/23 1013   aspirin  chewable tablet 81 mg  81 mg Oral Daily Gloris Manchester, MD   81 mg at 09/23/23 1014   hydrALAZINE (APRESOLINE) tablet 50 mg  50 mg Oral Q8H Gloris Manchester, MD   50 mg at 09/23/23 4403   irbesartan (AVAPRO) tablet 150 mg  150 mg Oral Daily Gloris Manchester, MD   150 mg at 09/23/23 1013   [START ON 09/25/2023] metFORMIN (GLUCOPHAGE) tablet 500 mg  500 mg Oral Q breakfast Gloris Manchester, MD       pantoprazole (PROTONIX) EC tablet 40 mg  40 mg Oral Daily Gloris Manchester, MD   40 mg at 09/23/23 1013   sertraline (ZOLOFT) tablet 25 mg  25 mg Oral Daily Gloris Manchester, MD   25 mg at 09/23/23 1013   Current Outpatient Medications  Medication Sig Dispense Refill   acetaminophen (TYLENOL) 325 MG tablet TAKE (2) TABLETS BY MOUTH EVERY SIX HOURS AS NEEDED FOR PAIN. MAX 3GM IN 24 HOURS. 120 tablet 5   albuterol (VENTOLIN HFA) 108 (90 Base) MCG/ACT inhaler Inhale 2 puffs into the lungs every 6 (six) hours as needed for wheezing or shortness of breath. 8 g 0   ALLERGY RELIEF 10 MG tablet Take 10 mg by mouth daily.     ALPRAZolam (XANAX) 0.25 MG tablet TAKE (1) TABLET BY MOUTH AT BEDTIME AS NEEDED FOR SLEEP. 30 tablet 3   amLODipine (NORVASC) 10 MG tablet Take 1 tablet (10 mg total) by mouth daily. 30 tablet 6   aspirin (ASPIRIN LOW DOSE) 81 MG chewable tablet CHEW (1) TABLET BY MOUTH ONCE DAILY. 30 tablet 12   BETA CAROTENE PROVITAMIN A 47425 units capsule Take 25,000 Units by mouth daily.     BISACODYL 5 MG EC tablet TAKE 1 TABLET BY MOUTH ONCE DAILY AS NEEDED FOR MODERATE CONSTIPATION. (Patient taking differently: Take 5 mg by mouth daily as needed for moderate constipation.) 30 tablet 0   clotrimazole (LOTRIMIN) 1 % cream Apply topically.     cyanocobalamin 1000 MCG tablet Take 1,000 mcg by mouth at bedtime.     dicyclomine (BENTYL) 10 MG capsule TAKE 1 CAPSULE BY MOUTH TWICE DAILY AS NEEDED FOR ABDOMINAL CRAMPS AND LOOSE STOOL. (Patient not taking: Reported on 09/15/2023) 60 capsule 0   EASYMAX TEST test strip CHECK BLOOD SUGAR  ONCE DAILY.(CALL MD IF BS BELOW 60: OR IF BS ABOVE 400) 50 strip 0   hydrALAZINE (APRESOLINE) 50 MG tablet Take 1 tablet (50 mg total) by mouth every 8 (eight) hours. 90 tablet 2   Iron, Ferrous Sulfate, 325 (65 Fe) MG TABS Take 325 mg by mouth every other day. 45 tablet 2   IVIZIA DRY EYES 0.5 % SOLN Place 1 drop into both eyes in the morning, at noon, in the evening, and at bedtime.     Melatonin 5 MG CAPS Take 1 capsule (5 mg total) by mouth at bedtime. 90 capsule 1   metFORMIN (GLUCOPHAGE) 500 MG tablet Take one tablet 500mg  po in the morning and 1/2 tablet 250 mg po at supper (Patient taking differently: Take 500 mg by mouth daily. Take one  tablet 500mg  po in the morning and 1/2 tablet 250 mg po at supper) 45 tablet 6   Multiple Vitamins-Minerals (PRESERVISION AREDS 2) CAPS Take 1 capsule by mouth in the morning and at bedtime.     Omega-3 Fatty Acids (FISH OIL) 1000 MG CAPS One capsule BID (Patient taking differently: Take 1 capsule by mouth in the morning and at bedtime.) 60 capsule 12   omeprazole (PRILOSEC) 40 MG capsule Take 40 mg by mouth daily.     ondansetron (ZOFRAN-ODT) 4 MG disintegrating tablet 4mg  ODT q4 hours prn nausea/vomit 10 tablet 0   pantoprazole (PROTONIX) 40 MG tablet Take 1 tablet (40 mg total) by mouth daily. 30 tablet 2   polyethylene glycol powder (GOODSENSE CLEARLAX) 17 GM/SCOOP powder 1 scoop in 8 ounces water once daily as needed for constipation 510 g 1   sertraline (ZOLOFT) 25 MG tablet Take 1 tablet (25 mg total) by mouth daily. 90 tablet 1   sodium chloride 1 g tablet Take by mouth daily.     valsartan (DIOVAN) 160 MG tablet Take 1 tablet (160 mg total) by mouth daily. 30 tablet 5   Vitamin D, Cholecalciferol, 10 MCG (400 UNIT) TABS TAKE (1) TABLET BY MOUTH ONCE DAILY. 30 tablet 0     Discharge Medications: Please see discharge summary for a list of discharge medications.  Relevant Imaging Results:  Relevant Lab Results:   Additional Information SSN:  [id number]  Isabella Bowens, [place]

## 2023-09-23 NOTE — ED Provider Notes (Signed)
 Emergency Medicine Observation Re-evaluation Note  [name] is a 89 y.o. female, seen on rounds today.  Pt initially presented to the ED for complaints of Chills Currently, the patient is resting comfortably.  There are no concerns from our nursing staff.  No acute events overnight.  Physical therapy assessment is pending.  Patient is awaiting placement for TOC.  She had a recent admission for pneumonia.  Physical Exam  BP (!) 161/70   Pulse 77   Temp 98.2 F (36.8 C)   Resp 20   Ht 5\' 8"  (1.727 m)   Wt 74.8 kg   SpO2 99%   BMI 25.07 kg/m  Physical Exam General: No acute distress Cardiac: Regular rate Lungs: No respiratory distress Psych: Currently calm  ED Course / MDM  EKG:EKG Interpretation Date/Time:  Monday September 22 2023 19:59:43 EDT Ventricular Rate:  78 PR Interval:  201 QRS Duration:  110 QT Interval:  409 QTC Calculation: 466 R Axis:   49  Text Interpretation: Sinus rhythm Atrial premature complex Anterior infarct, old Confirmed by Gloris Manchester [phone]) on 09/22/2023 8:07:35 PM  I have reviewed the labs performed to date as well as medications administered while in observation.  Recent changes in the last 24 hours include -no new changes.  I reviewed patient's labs, including UA which are reassuring.  Plan  Current plan is for patient to be placed.   Reassessment: Physical therapy has been completed.  It appears that patient likely will need higher level of care.  Will continue to collaborate with TOC.    Derwood Kaplan, MD 09/23/23 [phone]

## 2023-09-23 NOTE — Evaluation (Signed)
 Physical Therapy Evaluation Patient Details Name: [name] MRN: [id number] DOB: 08/08/1931 Today's Date: 09/23/2023  History of Present Illness  Patient with recent diagnoses of pneumonia and influenza.  Recently hospitalized for generalized weakness.  She was discharged to assisted living facility.  Since that time, she has had worsening weakness, likely secondary to deconditioning.  She is no longer able to ambulate.  She requires assistance with even getting out of the bed.  Workup today is unremarkable.  Given her physical deconditioning, patient may need higher level of care.  Patient to remain in the ED tonight for physical therapy evaluation in the morning.  Home medications ordered.   Clinical Impression  Pt demonstrates good independence with bed mobility, functional transfers, and modified independence with ambulation at this time. Pt demonstrates decreased LE strength and endurance but is able to complete one lap around the ED with RW and CGA. Pt would benefit from skilled PT upon return to ALF. Patient discharged from acute physical therapy to care of nursing for ambulation daily as tolerated for length of stay.          If plan is discharge home, recommend the following: A little help with walking and/or transfers;A little help with bathing/dressing/bathroom;Assistance with cooking/housework;Assist for transportation;Help with stairs or ramp for entrance   Can travel by private vehicle        Equipment Recommendations None recommended by PT  Recommendations for Other Services       Functional Status Assessment Patient has had a recent decline in their functional status and demonstrates the ability to make significant improvements in function in a reasonable and predictable amount of time.     Precautions / Restrictions Precautions Precautions: Fall Recall of Precautions/Restrictions: Intact Restrictions Weight Bearing Restrictions Per Provider Order: No       Mobility  Bed Mobility Overal bed mobility: Independent                  Transfers Overall transfer level: Independent Equipment used: Rolling walker (2 wheels)               General transfer comment: increased time but independent    Ambulation/Gait Ambulation/Gait assistance: Modified independent (Device/Increase time) Gait Distance (Feet): 150 Feet Assistive device: Rolling walker (2 wheels) Gait Pattern/deviations: WFL(Within Functional Limits), Decreased step length - right, Decreased step length - left, Decreased stride length, Trunk flexed Gait velocity: decreased     General Gait Details: decreased speed and stride length, fatigued after about 150 feet  Stairs            Wheelchair Mobility     Tilt Bed    Modified Rankin (Stroke Patients Only)       Balance Overall balance assessment: Modified Independent                                           Pertinent Vitals/Pain Pain Assessment Pain Assessment: No/denies pain    Home Living Family/patient expects to be discharged to:: Assisted living                 Home Equipment: Agricultural consultant (2 wheels)      Prior Function Prior Level of Function : Independent/Modified Independent             Mobility Comments: states that the last two weeks she has felt too lethargic to participate in therapy at  ALF ADLs Comments: Supervised by ALF staff     Extremity/Trunk Assessment   Upper Extremity Assessment Upper Extremity Assessment: Overall WFL for tasks assessed    Lower Extremity Assessment Lower Extremity Assessment: Generalized weakness;Overall [facility] for tasks assessed    Cervical / Trunk Assessment Cervical / Trunk Assessment: Kyphotic  Communication   Communication Communication: No apparent difficulties    Cognition Arousal: Alert Behavior During Therapy: WFL for tasks assessed/performed   PT - Cognitive impairments: No apparent impairments                          Following commands: Intact       Cueing Cueing Techniques: Verbal cues, Visual cues     General Comments      Exercises     Assessment/Plan    PT Assessment All further PT needs can be met in the next venue of care  PT Problem List Decreased strength;Decreased activity tolerance;Decreased balance;Decreased mobility       PT Treatment Interventions      PT Goals (Current goals can be found in the Care Plan section)  Acute Rehab PT Goals Patient Stated Goal: to be able to walk a mile again PT Goal Formulation: With patient Time For Goal Achievement: 10/07/23 Potential to Achieve Goals: Good    Frequency       Co-evaluation               AM-PAC PT "6 Clicks" Mobility  Outcome Measure Help needed turning from your back to your side while in a flat bed without using bedrails?: None Help needed moving from lying on your back to sitting on the side of a flat bed without using bedrails?: None Help needed moving to and from a bed to a chair (including a wheelchair)?: None Help needed standing up from a chair using your arms (e.g., wheelchair or bedside chair)?: None Help needed to walk in hospital room?: None Help needed climbing 3-5 steps with a railing? : A Little 6 Click Score: 23    End of Session Equipment Utilized During Treatment: Gait belt Activity Tolerance: Patient tolerated treatment well;No increased pain Patient left: in chair;with call bell/phone within reach Nurse Communication: Mobility status PT Visit Diagnosis: Muscle weakness (generalized) (M62.81);History of falling (Z91.81);Difficulty in walking, not elsewhere classified (R26.2);Other abnormalities of gait and mobility (R26.89)    Time: 9147-8295 PT Time Calculation (min) (ACUTE ONLY): 23 min   Charges:   PT Evaluation $PT Eval Low Complexity: 1 Low PT Treatments $Therapeutic Activity: 23-37 mins PT General Charges $$ ACUTE PT VISIT: 1 Visit          Luz Lex, PT, DPT [facility] Office: [phone] 10:46 AM, 09/23/23

## 2023-09-23 NOTE — ED Notes (Signed)
 [place] at bedside to get patient

## 2023-09-24 DIAGNOSIS — R278 Other lack of coordination: Secondary | ICD-10-CM | POA: Diagnosis not present

## 2023-09-24 DIAGNOSIS — M6281 Muscle weakness (generalized): Secondary | ICD-10-CM | POA: Diagnosis not present

## 2023-09-25 ENCOUNTER — Ambulatory Visit (INDEPENDENT_AMBULATORY_CARE_PROVIDER_SITE_OTHER): Admitting: Family Medicine

## 2023-09-25 VITALS — BP 144/58 | HR 93 | Temp 97.5°F | Ht 68.0 in | Wt 166.0 lb

## 2023-09-25 DIAGNOSIS — G4709 Other insomnia: Secondary | ICD-10-CM

## 2023-09-25 DIAGNOSIS — I10 Essential (primary) hypertension: Secondary | ICD-10-CM

## 2023-09-25 MED ORDER — MELATONIN 10 MG PO TABS: 10.0000 mg | ORAL_TABLET | Freq: Every day | ORAL | 3 refills | Status: DC

## 2023-09-25 NOTE — Patient Instructions (Signed)
 Melatonin increased.  Follow up with Dr. Lorin Picket in 1 month.

## 2023-09-25 NOTE — Assessment & Plan Note (Signed)
 Discussed treatment of her insomnia with her primary care physician, Dr. Lorin Picket.  After discussion, I am increasing her melatonin.

## 2023-09-25 NOTE — Assessment & Plan Note (Signed)
 Blood pressure at goal given advanced age.  Continue current medications.

## 2023-09-25 NOTE — Progress Notes (Signed)
 Subjective:  Patient ID: [name], female    DOB: [birth year]  Age: 89 y.o. MRN: [id number]  CC:   Chief Complaint  Patient presents with   Hypertension    Hospital follow up     HPI:  89 year old female presents for hospital follow up.  Recent hospitalization for generalized weakness and hypertensive urgency.  Patient also recently seen in the ER on 3/24 with generalized weakness.  Patient states that she is slowly improving.  Cough improved.  No fever.  Still having decreased appetite.  She does note that she is having some difficulty sleeping as well.  Patient Active Problem List   Diagnosis Date Noted   Acute blood loss anemia 04/17/2023   Gastritis and gastroduodenitis 04/10/2023   Abdominal pain, chronic, epigastric 04/08/2023   Mesenteric artery stenosis (HCC) 04/07/2023   Type 2 diabetes mellitus (HCC) 03/10/2023   Diabetic neuropathy (HCC) 11/30/2022   S/P total knee arthroplasty, right 04/12/2022   Aortic atherosclerosis (HCC) 02/25/2022   Chronic non-seasonal allergic rhinitis 02/25/2022   Protein-calorie malnutrition, severe (HCC) 02/25/2022   Chronic anxiety 02/25/2022   GERD (gastroesophageal reflux disease) 02/17/2022   Myalgia due to statin 02/15/2020   Cognitive dysfunction 03/04/2019   Generalized anxiety disorder 06/05/2018   Osteoarthritis of right knee 12/02/2017   Hyperlipidemia associated with type 2 diabetes mellitus (HCC) 10/02/2016   Insomnia 10/02/2016   Essential hypertension 12/15/2014   Type 2 diabetes mellitus with atherosclerosis of aorta (HCC) 02/15/2011    Social Hx   Social History   Socioeconomic History   Marital status: Widowed    Spouse name: Not on file   Number of children: Not on file   Years of education: Not on file   Highest education level: Not on file  Occupational History   Not on file  Tobacco Use   Smoking status: Never   Smokeless tobacco: Never  Vaping Use   Vaping status: Never Used  Substance and  Sexual Activity   Alcohol use: No   Drug use: No   Sexual activity: Never    Birth control/protection: Abstinence  Other Topics Concern   Not on file  Social History Narrative   Not on file   Social Drivers of Health   Financial Resource Strain: Low Risk  (09/06/2022)   Overall Financial Resource Strain (CARDIA)    Difficulty of Paying Living Expenses: Not hard at all  Food Insecurity: No Food Insecurity (04/08/2023)   Hunger Vital Sign    Worried About Running Out of Food in the Last Year: Never true    Ran Out of Food in the Last Year: Never true  Transportation Needs: No Transportation Needs (04/08/2023)   PRAPARE - Administrator, Civil Service (Medical): No    Lack of Transportation (Non-Medical): No  Physical Activity: Sufficiently Active (09/06/2022)   Exercise Vital Sign    Days of Exercise per Week: 5 days    Minutes of Exercise per Session: 30 min  Stress: No Stress Concern Present (09/06/2022)   [employer] of Occupational Health - Occupational Stress Questionnaire    Feeling of Stress : Not at all  Social Connections: Moderately Isolated (09/06/2022)   Social Connection and Isolation Panel [NHANES]    Frequency of Communication with Friends and Family: More than three times a week    Frequency of Social Gatherings with Friends and Family: More than three times a week    Attends Religious Services: 1 to 4 times per  year    Active Member of Clubs or Organizations: No    Attends Banker Meetings: Never    Marital Status: Widowed    Review of Systems Per HPI  Objective:  BP (!) 144/58   Pulse 93   Temp (!) 97.5 F (36.4 C)   Ht 5\' 8"  (1.727 m)   Wt 166 lb (75.3 kg)   SpO2 98%   BMI 25.24 kg/m      09/25/2023   10:14 AM 09/25/2023    9:48 AM 09/23/2023    2:43 PM  BP/Weight  Systolic BP 144 166 162  Diastolic BP 58 62 68  Wt. (Lbs)  166   BMI  25.24 kg/m2     Physical Exam Vitals and nursing note reviewed.  Constitutional:       General: She is not in acute distress.    Appearance: Normal appearance.  HENT:     Head: Normocephalic and atraumatic.  Cardiovascular:     Rate and Rhythm: Normal rate and regular rhythm.  Pulmonary:     Effort: Pulmonary effort is normal.     Breath sounds: Normal breath sounds.  Neurological:     Mental Status: She is alert.  Psychiatric:        Mood and Affect: Mood normal.        Behavior: Behavior normal.     Lab Results  Component Value Date   WBC 7.4 09/22/2023   HGB 12.1 09/22/2023   HCT 36.6 09/22/2023   PLT 363 09/22/2023   GLUCOSE 133 (H) 09/22/2023   CHOL 220 (H) 08/01/2023   TRIG 257 (H) 08/01/2023   HDL 54 08/01/2023   LDLCALC 121 (H) 08/01/2023   ALT 13 09/22/2023   AST 24 09/22/2023   NA 132 (L) 09/22/2023   K 4.2 09/22/2023   CL 97 (L) 09/22/2023   CREATININE 0.76 09/22/2023   BUN 13 09/22/2023   CO2 22 09/22/2023   TSH 1.739 09/17/2023   INR 1.1 04/11/2023   HGBA1C 6.6 (H) 08/01/2023   MICROALBUR 1.51 11/18/2013     Assessment & Plan:  Essential hypertension Assessment & Plan: Blood pressure at goal given advanced age.  Continue current medications.   Other insomnia Assessment & Plan: Discussed treatment of her insomnia with her primary care physician, Dr. Lorin Picket.  After discussion, I am increasing her melatonin.   Other orders -     Melatonin; Take 10 mg by mouth at bedtime.  Dispense: 30 tablet; Refill: 3    Follow-up: Advised follow-up with PCP in 1 month.  Everlene Other DO [facility]

## 2023-09-26 ENCOUNTER — Other Ambulatory Visit: Payer: Self-pay | Admitting: Family Medicine

## 2023-09-26 DIAGNOSIS — M6281 Muscle weakness (generalized): Secondary | ICD-10-CM | POA: Diagnosis not present

## 2023-09-29 DIAGNOSIS — M6281 Muscle weakness (generalized): Secondary | ICD-10-CM | POA: Diagnosis not present

## 2023-09-29 DIAGNOSIS — R278 Other lack of coordination: Secondary | ICD-10-CM | POA: Diagnosis not present

## 2023-09-30 DIAGNOSIS — M6281 Muscle weakness (generalized): Secondary | ICD-10-CM | POA: Diagnosis not present

## 2023-10-01 DIAGNOSIS — R278 Other lack of coordination: Secondary | ICD-10-CM | POA: Diagnosis not present

## 2023-10-01 DIAGNOSIS — M6281 Muscle weakness (generalized): Secondary | ICD-10-CM | POA: Diagnosis not present

## 2023-10-02 ENCOUNTER — Ambulatory Visit (INDEPENDENT_AMBULATORY_CARE_PROVIDER_SITE_OTHER): Payer: 59 | Admitting: Family

## 2023-10-02 VITALS — BP 170/62 | HR 98 | Ht 68.0 in | Wt 166.7 lb

## 2023-10-02 DIAGNOSIS — E871 Hypo-osmolality and hyponatremia: Secondary | ICD-10-CM | POA: Diagnosis not present

## 2023-10-02 DIAGNOSIS — I10 Essential (primary) hypertension: Secondary | ICD-10-CM | POA: Diagnosis not present

## 2023-10-02 DIAGNOSIS — M6281 Muscle weakness (generalized): Secondary | ICD-10-CM | POA: Diagnosis not present

## 2023-10-02 NOTE — Patient Instructions (Addendum)
 Medication Instructions:  If you feel the Melatonin is not working, reasonable to skip 2 days of Melatonin then resume at 10mg  every evening.    Labwork: Your physician recommends that you return for lab work today- BMP & Renin- Aldosterone    Follow-Up: Follow up TBD based on BP log    Special Instructions:  Recommend checking blood pressure once per day for 2 weeks and then faxing the results to [phone].

## 2023-10-02 NOTE — Progress Notes (Signed)
 Advanced [facility] Initial Assessment:    Date:  10/02/2023   ID:  [name], DOB 05-Apr-1932, MRN [id number]  PCP:  Babs Sciara, MD  Cardiologist:  Charlton Haws, MD  Nephrologist:  Referring MD: Rondel Baton, MD   CC: Hypertension  History of Present Illness:    [name] is a 89 y.o. female with a hx of HTN, HLD, DM2 here to establish care in the [facility]. Previously evaluated by Dr. Eden Emms and Randall An, PA.   Referred by ED provider after ED visit 08/14/23 with SBP 202. Hydrochlorothiazide had been stopped 2 weeks prior as made her feel poorly. She was given Keflex for UTI. Her son noted anxiety regarding BP. She was started on Spironolactone.   Admitted 3/16-3/20/25 for generalized weakness after recent influenza, pneumonia infection. She had hypertensive urgency with BP up to 220 with wide pulse pressure. Discharged on Valsartan, Amlodipine, Hydralazine. Her hyponatremia was felt to be due to volume depletion and poor solute intake. She was discharged on sodium chloride 1g daily.   ED visit 09/22/22 with worsening weakness and unable to ambulate. She was given IVF and discharged to ILF [facility].    Discussed the use of AI scribe software for clinical note transcription with the patient, who gave verbal consent to proceed.  History of Present Illness The patient, a resident of an assisted living facility, presents with a history of high blood pressure. Pleasant lady who worked for [employer] for many years. She reports experiencing lightheadedness, dizziness, and a sensation of pressure in the head when her blood pressure is elevated. These symptoms have been less frequent since the initiation of hydralazine. The patient also has a history of knee replacement and has been less active since the surgery in October 2023. She used to walk a mile a day but has reduced her activity level due to the knee replacement. Has still  been walking hallways at her ILF, but reduced distances. She is feeling much better after recovering from PNA and influenza. The patient's blood pressure was initially high at the start of the visit but decreased to 142/62 after some time. The patient's current medications include hydralazine, valsartan, and amlodipine. She also takes a salt tablet daily due to previous instances of low sodium levels. The patient reports poor sleep quality and takes melatonin nightly.  Previous antihypertensives: Hydrochlorothiazide - felt poorly Spironolactone Lisinopril - lethargy  Past Medical History:  Diagnosis Date   Blood transfusion without reported diagnosis    Cognitive dysfunction 03/04/2019   Patient scores 21 out of 30 on Montreal cognitive assessment September 2020   Diabetes mellitus without complication (HCC)    Diabetic peripheral neuropathy associated with type 2 diabetes mellitus (HCC) 02/25/2022   Diverticulitis    Frailty 03/04/2019   H/O bilateral breast reduction surgery    Hyperlipidemia    a. intolerant to statins.    Hypertension     Past Surgical History:  Procedure Laterality Date   ABDOMINAL HYSTERECTOMY  2003   APPENDECTOMY     age 66   BIOPSY  04/10/2023   Procedure: BIOPSY;  Surgeon: Franky Macho, MD;  Location: [facility];  Service: Endoscopy;;   BREAST REDUCTION SURGERY     age 89   COLON SURGERY Left 2011   Hemicolectomy due to diverticulitis   ESOPHAGOGASTRODUODENOSCOPY (EGD) WITH PROPOFOL N/A 04/10/2023   Procedure: ESOPHAGOGASTRODUODENOSCOPY (EGD) WITH PROPOFOL;  Surgeon: Franky Macho, MD;  Location: [facility];  Service: Endoscopy;  Laterality: N/A;   EYE SURGERY  03/30/2009   cataract   IR ANGIOGRAM VISCERAL SELECTIVE  04/11/2023   IR INTRAVASCULAR ULTRASOUND NON CORONARY  04/11/2023   IR TRANSCATH PLC STENT 1ST ART NOT LE CV CAR VERT CAR  04/11/2023   IR US GUIDE VASC ACCESS RIGHT  04/11/2023   KNEE SURGERY Right 2003   knee cap    LAPAROSCOPIC INCISIONAL / UMBILICAL / VENTRAL HERNIA REPAIR  02/23/2007   REDUCTION MAMMAPLASTY Bilateral 2001   REFRACTIVE SURGERY     TOTAL KNEE ARTHROPLASTY Right 04/10/2022   Procedure: TOTAL KNEE ARTHROPLASTY;  Surgeon: Eugenia Mcalpine, MD;  Location: [facility];  Service: Orthopedics;  Laterality: Right;  adductor canal    Current Medications: Current Meds  Medication Sig   amLODipine (NORVASC) 10 MG tablet Take 1 tablet (10 mg total) by mouth daily.   aspirin (ASPIRIN LOW DOSE) 81 MG chewable tablet CHEW (1) TABLET BY MOUTH ONCE DAILY.   BETA CAROTENE PROVITAMIN A 60454 units capsule Take 25,000 Units by mouth daily.   cyanocobalamin 1000 MCG tablet Take 1,000 mcg by mouth at bedtime.   cycloSPORINE (RESTASIS) 0.05 % ophthalmic emulsion 1 drop 2 (two) times daily.   Glucerna (GLUCERNA) LIQD Take 1 Can by mouth daily.   hydrALAZINE (APRESOLINE) 50 MG tablet Take 1 tablet (50 mg total) by mouth every 8 (eight) hours.   Iron, Ferrous Sulfate, 325 (65 Fe) MG TABS Take 325 mg by mouth every other day.   IVIZIA DRY EYES 0.5 % SOLN Place 1 drop into both eyes 2 (two) times daily.   Melatonin 10 MG TABS Take 10 mg by mouth at bedtime.   metFORMIN (GLUCOPHAGE) 500 MG tablet Take one tablet 500mg  po in the morning and 1/2 tablet 250 mg po at supper   Multiple Vitamins-Minerals (PRESERVISION AREDS 2) CAPS Take 1 capsule by mouth in the morning and at bedtime.   Omega-3 Fatty Acids (FISH OIL) 1000 MG CAPS One capsule BID (Patient taking differently: Take 1 capsule by mouth in the morning and at bedtime.)   sertraline (ZOLOFT) 25 MG tablet Take 1 tablet (25 mg total) by mouth daily.   sodium chloride 1 g tablet Take by mouth daily.   valsartan (DIOVAN) 160 MG tablet Take 1 tablet (160 mg total) by mouth daily.   Vitamin D, Cholecalciferol, 10 MCG (400 UNIT) TABS TAKE (1) TABLET BY MOUTH ONCE DAILY.     Allergies:   Hydrocortisone, Lisinopril, Phenergan [promethazine hcl], Statins, Trazodone and  nefazodone, and Prednisone   Social History   Socioeconomic History   Marital status: Widowed    Spouse name: Not on file   Number of children: Not on file   Years of education: Not on file   Highest education level: Not on file  Occupational History   Not on file  Tobacco Use   Smoking status: Never   Smokeless tobacco: Never  Vaping Use   Vaping status: Never Used  Substance and Sexual Activity   Alcohol use: No   Drug use: No   Sexual activity: Never    Birth control/protection: Abstinence  Other Topics Concern   Not on file  Social History Narrative   Not on file   Social Drivers of Health   Financial Resource Strain: Low Risk  (09/06/2022)   Overall Financial Resource Strain (CARDIA)    Difficulty of Paying Living Expenses: Not hard at all  Food Insecurity: No Food Insecurity (04/08/2023)   Hunger Vital Sign  Worried About Programme researcher, broadcasting/film/video in the Last Year: Never true    Ran Out of Food in the Last Year: Never true  Transportation Needs: No Transportation Needs (04/08/2023)   PRAPARE - Administrator, Civil Service (Medical): No    Lack of Transportation (Non-Medical): No  Physical Activity: Sufficiently Active (09/06/2022)   Exercise Vital Sign    Days of Exercise per Week: 5 days    Minutes of Exercise per Session: 30 min  Stress: No Stress Concern Present (09/06/2022)   [employer] of Occupational Health - Occupational Stress Questionnaire    Feeling of Stress : Not at all  Social Connections: Moderately Isolated (09/06/2022)   Social Connection and Isolation Panel [NHANES]    Frequency of Communication with Friends and Family: More than three times a week    Frequency of Social Gatherings with Friends and Family: More than three times a week    Attends Religious Services: 1 to 4 times per year    Active Member of [employer] or Organizations: No    Attends Banker Meetings: Never    Marital Status: Widowed     Family History: The  patient's family history includes Cancer in her father and mother; Diabetes in her brother; Hypertension in her father and sister; Stroke in her father.  ROS:   Please see the history of present illness.     All other systems reviewed and are negative.  EKGs/Labs/Other Studies Reviewed:         Recent Labs: 09/17/2023: TSH 1.739 09/18/2023: Magnesium 2.1 09/22/2023: ALT 13; B Natriuretic Peptide 88.0; BUN 13; Creatinine, Ser 0.76; Hemoglobin 12.1; Platelets 363; Potassium 4.2; Sodium 132   Recent Lipid Panel    Component Value Date/Time   CHOL 220 (H) 08/01/2023 0832   TRIG 257 (H) 08/01/2023 0832   HDL 54 08/01/2023 0832   CHOLHDL 4.1 08/01/2023 0832   CHOLHDL 3.3 07/05/2014 0907   VLDL 22 07/05/2014 0907   LDLCALC 121 (H) 08/01/2023 0832    Physical Exam:   VS:  BP (!) 170/62 (BP Location: Right Arm, Patient Position: Sitting, Cuff Size: Normal)   Pulse 98   Ht 5\' 8"  (1.727 m)   Wt 166 lb 11.2 oz (75.6 kg)   SpO2 98%   BMI 25.35 kg/m  , BMI Body mass index is 25.35 kg/m. GENERAL:  Well appearing HEENT: Pupils equal round and reactive, fundi not visualized, oral mucosa unremarkable NECK:  No jugular venous distention, waveform within normal limits, carotid upstroke brisk and symmetric, no bruits, no thyromegaly LYMPHATICS:  No cervical adenopathy LUNGS:  Clear to auscultation bilaterally HEART:  RRR.  PMI not displaced or sustained,S1 and S2 within normal limits, no S3, no S4, no clicks, no rubs, no murmurs ABD:  Flat, positive bowel sounds normal in frequency in pitch, no bruits, no rebound, no guarding, no midline pulsatile mass, no hepatomegaly, no splenomegaly EXT:  2 plus pulses throughout, no edema, no cyanosis no clubbing SKIN:  No rashes no nodules NEURO:  Cranial nerves II through XII grossly intact, motor grossly intact throughout PSYCH:  Cognitively intact, oriented to person place and time   ASSESSMENT/PLAN:    Assessment and Plan Assessment &  Plan Hypertension Long-standing hypertension. Initial BP in clinic 166/64 in left arm and 170/62 in right arm with repeat after sitting and resting of 142/62. She is pleased with BP response to  Hydralazine. BP goal <140/90. Avoid over-correction of BP to prevent lightheadedness and falls.  She prefers medication management over surgical interventions due to age and personal choice.As such, will defer renal artery duplex. Normal adrenal glands by CT 08/2023. 09/17/23 normal TSH 1.739.  - Monitor blood pressure daily for two weeks at [facility] and request theyfax results. If BP at goal, could reduce BP checks to 2-3x per week. - Order renin and aldosterone blood tests to evaluate for hyperaldosteronism. - Recheck sodium levels with BMET. - Adjust hydralazine dosage from 50 to 75mg  vs 100mg  TID if blood pressure remains high after two weeks of monitoring. - Continue current antihypertensive regimen with hydralazine 50mg  TID, valsartan 160mg  daily, and amlodipine 10mg  daily.  Hyponatremia  She is presently on sodium chloride 1g daily which is likely contributory to hypertension. Reports feelig poorly with lower sodium levels.  -update BMET today.   Insomnia Experiencing difficulty with sleep onset and maintenance. Taking Melatonin 10 mg nightly and Alprazolam 0.25mg  at bedtime. Discussed considering a two-day break from melatonin to reset its effectiveness.  Follow-up Plans to monitor blood pressure and adjust treatment as needed. - Follow up with Doctor Franky Macho on April 17. - Review blood pressure readings after two weeks and adjust treatment accordingly.   Screening for Secondary Hypertension:     Relevant Labs/Studies:    Latest Ref Rng & Units 09/22/2023    6:48 PM 09/18/2023    4:55 AM 09/17/2023   12:00 PM  Basic Labs  Sodium 135 - 145 mmol/L 132  132  131   Potassium 3.5 - 5.1 mmol/L 4.2  3.8  4.2   Creatinine 0.44 - 1.00 mg/dL 1.61  0.96  0.45        Latest Ref Rng & Units 09/17/2023    12:00 PM 04/08/2023    2:42 PM  Thyroid   TSH 0.350 - 4.500 uIU/mL 1.739  1.161           Latest Ref Rng & Units 03/11/2016   11:48 AM  Metanephrines/Catecholamines   Metanephrines 0 - 62 pg/mL 11   Normetanephrines  0 - 145 pg/mL 236             Disposition:    FU with MD/APP/PharmD pending BP log in 2 weeks. If BP controlled <140/90 at ILF, anticipate can follow up with cardiology PRN.    Medication Adjustments/Labs and Tests Ordered: Current medicines are reviewed at length with the patient today.  Concerns regarding medicines are outlined above.  No orders of the defined types were placed in this encounter.  No orders of the defined types were placed in this encounter.    Signed, Alver Sorrow, NP  10/02/2023 10:41 AM    [facility]

## 2023-10-05 ENCOUNTER — Encounter (HOSPITAL_BASED_OUTPATIENT_CLINIC_OR_DEPARTMENT_OTHER): Payer: Self-pay | Admitting: Family

## 2023-10-06 ENCOUNTER — Telehealth: Payer: Self-pay | Admitting: Family Medicine

## 2023-10-06 ENCOUNTER — Other Ambulatory Visit: Payer: Self-pay | Admitting: Family Medicine

## 2023-10-06 DIAGNOSIS — M6281 Muscle weakness (generalized): Secondary | ICD-10-CM | POA: Diagnosis not present

## 2023-10-06 DIAGNOSIS — I10 Essential (primary) hypertension: Secondary | ICD-10-CM

## 2023-10-06 DIAGNOSIS — D500 Iron deficiency anemia secondary to blood loss (chronic): Secondary | ICD-10-CM

## 2023-10-06 DIAGNOSIS — R278 Other lack of coordination: Secondary | ICD-10-CM | POA: Diagnosis not present

## 2023-10-06 DIAGNOSIS — E119 Type 2 diabetes mellitus without complications: Secondary | ICD-10-CM

## 2023-10-06 NOTE — Telephone Encounter (Signed)
 Copied from CRM [id number]. Topic: General - Other >> Oct 06, 2023  2:59 PM Eunice Blase wrote: Reason for CRM: Received call for [facility] per Genella Rife ph: [phone] forms were dropped off at office two weeks ago for physician order sheet. Would like a update?

## 2023-10-06 NOTE — Telephone Encounter (Signed)
 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 Thanks

## 2023-10-06 NOTE — Telephone Encounter (Signed)
 Through the past several weeks I have signed off on multiple forms and send these upfront.  I do not have any additional forms for this patient on my desk or in my box If more forms need to be reviewed or signed please send them to me thank you

## 2023-10-07 ENCOUNTER — Other Ambulatory Visit: Payer: Self-pay | Admitting: Family Medicine

## 2023-10-07 ENCOUNTER — Encounter (HOSPITAL_BASED_OUTPATIENT_CLINIC_OR_DEPARTMENT_OTHER): Payer: Self-pay

## 2023-10-07 ENCOUNTER — Other Ambulatory Visit: Payer: Self-pay

## 2023-10-07 DIAGNOSIS — M6281 Muscle weakness (generalized): Secondary | ICD-10-CM | POA: Diagnosis not present

## 2023-10-07 LAB — BASIC METABOLIC PANEL WITH GFR
BUN/Creatinine Ratio: 23 (ref 12–28)
BUN: 19 mg/dL (ref 10–36)
CO2: 26 mmol/L (ref 20–29)
Calcium: 10.4 mg/dL — ABNORMAL HIGH (ref 8.7–10.3)
Chloride: 96 mmol/L (ref 96–106)
Creatinine, Ser: 0.81 mg/dL (ref 0.57–1.00)
Glucose: 89 mg/dL (ref 70–99)
Potassium: 4.6 mmol/L (ref 3.5–5.2)
Sodium: 136 mmol/L (ref 134–144)
eGFR: 69 mL/min/{1.73_m2} (ref >59)

## 2023-10-07 LAB — ALDOSTERONE + RENIN ACTIVITY W/ RATIO
Aldos/Renin Ratio: 5.2 (ref 0.0–30.0)
Aldosterone: 8.5 ng/dL (ref 0.0–30.0)
Renin Activity, Plasma: 1.642 ng/mL/h (ref 0.167–5.380)

## 2023-10-07 MED ORDER — SODIUM CHLORIDE 1 G PO TABS: 1.0000 g | ORAL_TABLET | Freq: Every day | ORAL | 2 refills | Status: DC

## 2023-10-08 ENCOUNTER — Ambulatory Visit: Admitting: Physician Assistant

## 2023-10-08 ENCOUNTER — Telehealth: Payer: Self-pay | Admitting: *Deleted

## 2023-10-08 ENCOUNTER — Other Ambulatory Visit: Payer: Self-pay | Admitting: Family Medicine

## 2023-10-08 ENCOUNTER — Other Ambulatory Visit: Payer: Self-pay | Admitting: Physician Assistant

## 2023-10-08 ENCOUNTER — Encounter: Payer: Self-pay | Admitting: Physician Assistant

## 2023-10-08 VITALS — BP 159/72 | Ht 68.0 in | Wt 166.0 lb

## 2023-10-08 DIAGNOSIS — M6281 Muscle weakness (generalized): Secondary | ICD-10-CM | POA: Diagnosis not present

## 2023-10-08 DIAGNOSIS — N3001 Acute cystitis with hematuria: Secondary | ICD-10-CM

## 2023-10-08 DIAGNOSIS — N3000 Acute cystitis without hematuria: Secondary | ICD-10-CM | POA: Insufficient documentation

## 2023-10-08 DIAGNOSIS — I10 Essential (primary) hypertension: Secondary | ICD-10-CM

## 2023-10-08 LAB — POCT URINALYSIS DIPSTICK
Spec Grav, UA: 1.01 (ref 1.010–1.025)
pH, UA: 7 (ref 5.0–8.0)

## 2023-10-08 MED ORDER — ALPRAZOLAM 0.25 MG PO TABS: 0.2500 mg | ORAL_TABLET | Freq: Every day | ORAL | 5 refills | Status: DC

## 2023-10-08 MED ORDER — SULFAMETHOXAZOLE-TRIMETHOPRIM 800-160 MG PO TABS: 1.0000 | ORAL_TABLET | Freq: Two times a day (BID) | ORAL | 0 refills | Status: AC

## 2023-10-08 NOTE — Assessment & Plan Note (Signed)
 Patient appears stable today.  Physical exam without abnormal findings.  No CVA tenderness or suprapubic tenderness.  Urine dipstick done in office today. Urine sent for culture, will review sensitivities and treat as indicated.  Patient started on Bactrim today, based on previous cultures.  Patient should return to office if she experiences nausea, vomiting, fevers, flank pain.

## 2023-10-08 NOTE — Progress Notes (Signed)
 Acute Office Visit  Subjective:     Patient ID: [name], female    DOB: [birth year], 89 y.o.   MRN: [id number]   Patient presents today for concerns for UTI. She reports 2 days worth of urinary frequency and discomfort with urination. She reports cloudy and malodorous urine. She denies fevers, nausea, vomiting, hematuria or flank pain. Previously treated for UTI approximately 1 month ago.      Review of Systems  Constitutional:  Negative for chills, fever and weight loss.  Gastrointestinal:  Negative for nausea and vomiting.  Genitourinary:  Positive for dysuria, frequency and urgency. Negative for flank pain and hematuria.        Objective:     BP (!) 159/72   Ht 5\' 8"  (1.727 m)   Wt 166 lb (75.3 kg)   BMI 25.24 kg/m   Physical Exam Vitals reviewed.  Constitutional:      Appearance: Normal appearance.  Cardiovascular:     Rate and Rhythm: Normal rate and regular rhythm.     Heart sounds: Normal heart sounds. No murmur heard. Pulmonary:     Effort: Pulmonary effort is normal.     Breath sounds: Normal breath sounds.  Abdominal:     General: Abdomen is flat. Bowel sounds are normal.     Palpations: Abdomen is soft.     Tenderness: There is no abdominal tenderness. There is no right CVA tenderness or left CVA tenderness.  Skin:    General: Skin is warm and dry.  Neurological:     General: No focal deficit present.     Mental Status: She is alert and oriented to person, place, and time.  Psychiatric:        Mood and Affect: Mood normal.        Behavior: Behavior normal.     Results for orders placed or performed in visit on 10/08/23  POCT urinalysis dipstick  Result Value Ref Range   Color, UA     Clarity, UA     Glucose, UA     Bilirubin, UA     Ketones, UA     Spec Grav, UA 1.010 1.010 - 1.025   Blood, UA moderate    pH, UA 7.0 5.0 - 8.0   Protein, UA     Urobilinogen, UA     Nitrite, UA     Leukocytes, UA Large (3+) (A) Negative    Appearance     Odor          Assessment & Plan:  Acute cystitis with hematuria Assessment & Plan: Patient appears stable today.  Physical exam without abnormal findings.  No CVA tenderness or suprapubic tenderness.  Urine dipstick done in office today. Urine sent for culture, will review sensitivities and treat as indicated.  Patient started on Bactrim today, based on previous cultures.  Patient should return to office if she experiences nausea, vomiting, fevers, flank pain.    Orders: -     Urine Culture -     POCT urinalysis dipstick -     Sulfamethoxazole-Trimethoprim; Take 1 tablet by mouth 2 (two) times daily for 3 days.  Dispense: 6 tablet; Refill: 0  Essential hypertension Assessment & Plan: 168/67, 159/72 Uncontrolled, but improved from recent visits. Patient scheduled to see Dr. Lorin Picket in 1 week. Will leave medications as is for the time being.  Continue current medications. No change in management. Discussed DASH diet and dietary sodium restrictions.  Continue dietary efforts and physical activity.  Return in about 8 days (around 10/16/2023) for previously scheduled appt .  Toni Amend Sue Fernicola, PA-C

## 2023-10-08 NOTE — Assessment & Plan Note (Addendum)
 168/67, 159/72 Uncontrolled, but improved from recent visits. Patient scheduled to see Dr. Lorin Picket in 1 week. Will leave medications as is for the time being.  Continue current medications. No change in management. Discussed DASH diet and dietary sodium restrictions.  Continue dietary efforts and physical activity.

## 2023-10-08 NOTE — Telephone Encounter (Signed)
 [facility] requesting order to switch xanax to one at bedtime for sleep vs PRN because patient takes every night for sleep  [facility]

## 2023-10-08 NOTE — Telephone Encounter (Signed)
 Her prescription was sent to [facility] We have changed her directions to 0.25 mg each evening for sleep We have already filled out the order from her long-term care facility

## 2023-10-09 DIAGNOSIS — F5101 Primary insomnia: Secondary | ICD-10-CM | POA: Diagnosis not present

## 2023-10-09 DIAGNOSIS — F419 Anxiety disorder, unspecified: Secondary | ICD-10-CM | POA: Diagnosis not present

## 2023-10-09 DIAGNOSIS — F331 Major depressive disorder, recurrent, moderate: Secondary | ICD-10-CM | POA: Diagnosis not present

## 2023-10-10 DIAGNOSIS — R278 Other lack of coordination: Secondary | ICD-10-CM | POA: Diagnosis not present

## 2023-10-10 DIAGNOSIS — M6281 Muscle weakness (generalized): Secondary | ICD-10-CM | POA: Diagnosis not present

## 2023-10-13 DIAGNOSIS — M6281 Muscle weakness (generalized): Secondary | ICD-10-CM | POA: Diagnosis not present

## 2023-10-13 DIAGNOSIS — R278 Other lack of coordination: Secondary | ICD-10-CM | POA: Diagnosis not present

## 2023-10-14 ENCOUNTER — Encounter: Payer: Self-pay | Admitting: Physician Assistant

## 2023-10-14 DIAGNOSIS — M6281 Muscle weakness (generalized): Secondary | ICD-10-CM | POA: Diagnosis not present

## 2023-10-14 DIAGNOSIS — R278 Other lack of coordination: Secondary | ICD-10-CM | POA: Diagnosis not present

## 2023-10-14 LAB — URINE CULTURE

## 2023-10-14 LAB — SPECIMEN STATUS REPORT

## 2023-10-16 ENCOUNTER — Encounter: Payer: Self-pay | Admitting: Family Medicine

## 2023-10-16 ENCOUNTER — Ambulatory Visit (INDEPENDENT_AMBULATORY_CARE_PROVIDER_SITE_OTHER): Admitting: Family Medicine

## 2023-10-16 VITALS — BP 144/66 | HR 81 | Temp 98.6°F | Ht 68.0 in | Wt 166.0 lb

## 2023-10-16 DIAGNOSIS — E785 Hyperlipidemia, unspecified: Secondary | ICD-10-CM | POA: Diagnosis not present

## 2023-10-16 DIAGNOSIS — E1169 Type 2 diabetes mellitus with other specified complication: Secondary | ICD-10-CM

## 2023-10-16 DIAGNOSIS — E1159 Type 2 diabetes mellitus with other circulatory complications: Secondary | ICD-10-CM | POA: Diagnosis not present

## 2023-10-16 DIAGNOSIS — Z79899 Other long term (current) drug therapy: Secondary | ICD-10-CM

## 2023-10-16 DIAGNOSIS — I10 Essential (primary) hypertension: Secondary | ICD-10-CM | POA: Diagnosis not present

## 2023-10-16 DIAGNOSIS — E119 Type 2 diabetes mellitus without complications: Secondary | ICD-10-CM

## 2023-10-16 NOTE — Progress Notes (Signed)
   Subjective:    Patient ID: [name], female    DOB: April 28, 1932, 89 y.o.   MRN: [id number]  HPI pneumonia f/u  Patient recently diagnosed with pneumonia Waynetta Hair out her antibiotics Doing better Also diagnosed with UTI was on antibiotics Not having any secondary effects Overall energy level doing okay Walking some Blood pressure under reasonable control for her age   Review of Systems     Objective:   Physical Exam General-in no acute distress Eyes-no discharge Lungs-respiratory rate normal, CTA CV-no murmurs,RRR Extremities skin warm dry no edema Neuro grossly normal Behavior normal, alert        Assessment & Plan:  1. Essential hypertension (Primary) Blood pressure under good control With her standing the diastolic drops therefore we will not increase the dose of her hydralazine currently  2. Hyperlipidemia associated with type 2 diabetes mellitus (HCC) Continue statin healthy diet check lab work before next visit   3. Controlled type 2 diabetes mellitus without complication, without long-term current use of insulin (HCC) Continue medication watch diet closely  4. High risk medication use Check labs before next visit  Patient relates that she will follow through with us  regarding her blood pressure.  Our office appreciates the input from hypertension control we will certainly get their input should her situation worsen Given her low diastolic and her risk of bottoming out we will maintain hydralazine at current dosing  Follow-up as planned within 3 to 4 months

## 2023-10-17 DIAGNOSIS — M6281 Muscle weakness (generalized): Secondary | ICD-10-CM | POA: Diagnosis not present

## 2023-10-17 DIAGNOSIS — R278 Other lack of coordination: Secondary | ICD-10-CM | POA: Diagnosis not present

## 2023-10-20 DIAGNOSIS — R278 Other lack of coordination: Secondary | ICD-10-CM | POA: Diagnosis not present

## 2023-10-20 DIAGNOSIS — M6281 Muscle weakness (generalized): Secondary | ICD-10-CM | POA: Diagnosis not present

## 2023-10-21 DIAGNOSIS — L84 Corns and callosities: Secondary | ICD-10-CM | POA: Diagnosis not present

## 2023-10-21 DIAGNOSIS — E1142 Type 2 diabetes mellitus with diabetic polyneuropathy: Secondary | ICD-10-CM | POA: Diagnosis not present

## 2023-10-21 DIAGNOSIS — M6281 Muscle weakness (generalized): Secondary | ICD-10-CM | POA: Diagnosis not present

## 2023-10-21 DIAGNOSIS — B351 Tinea unguium: Secondary | ICD-10-CM | POA: Diagnosis not present

## 2023-10-22 DIAGNOSIS — R278 Other lack of coordination: Secondary | ICD-10-CM | POA: Diagnosis not present

## 2023-10-22 DIAGNOSIS — M6281 Muscle weakness (generalized): Secondary | ICD-10-CM | POA: Diagnosis not present

## 2023-10-24 DIAGNOSIS — R278 Other lack of coordination: Secondary | ICD-10-CM | POA: Diagnosis not present

## 2023-10-24 DIAGNOSIS — M6281 Muscle weakness (generalized): Secondary | ICD-10-CM | POA: Diagnosis not present

## 2023-10-27 DIAGNOSIS — M6281 Muscle weakness (generalized): Secondary | ICD-10-CM | POA: Diagnosis not present

## 2023-10-27 DIAGNOSIS — R278 Other lack of coordination: Secondary | ICD-10-CM | POA: Diagnosis not present

## 2023-10-28 DIAGNOSIS — M6281 Muscle weakness (generalized): Secondary | ICD-10-CM | POA: Diagnosis not present

## 2023-10-29 DIAGNOSIS — R278 Other lack of coordination: Secondary | ICD-10-CM | POA: Diagnosis not present

## 2023-10-29 DIAGNOSIS — M6281 Muscle weakness (generalized): Secondary | ICD-10-CM | POA: Diagnosis not present

## 2023-10-30 DIAGNOSIS — M6281 Muscle weakness (generalized): Secondary | ICD-10-CM | POA: Diagnosis not present

## 2023-10-31 ENCOUNTER — Other Ambulatory Visit: Payer: Self-pay | Admitting: Family Medicine

## 2023-10-31 DIAGNOSIS — M6281 Muscle weakness (generalized): Secondary | ICD-10-CM | POA: Diagnosis not present

## 2023-10-31 DIAGNOSIS — R278 Other lack of coordination: Secondary | ICD-10-CM | POA: Diagnosis not present

## 2023-11-03 DIAGNOSIS — M6281 Muscle weakness (generalized): Secondary | ICD-10-CM | POA: Diagnosis not present

## 2023-11-03 DIAGNOSIS — R278 Other lack of coordination: Secondary | ICD-10-CM | POA: Diagnosis not present

## 2023-11-04 DIAGNOSIS — M6281 Muscle weakness (generalized): Secondary | ICD-10-CM | POA: Diagnosis not present

## 2023-11-05 DIAGNOSIS — M6281 Muscle weakness (generalized): Secondary | ICD-10-CM | POA: Diagnosis not present

## 2023-11-05 DIAGNOSIS — R278 Other lack of coordination: Secondary | ICD-10-CM | POA: Diagnosis not present

## 2023-11-06 DIAGNOSIS — F419 Anxiety disorder, unspecified: Secondary | ICD-10-CM | POA: Diagnosis not present

## 2023-11-06 DIAGNOSIS — F5101 Primary insomnia: Secondary | ICD-10-CM | POA: Diagnosis not present

## 2023-11-06 DIAGNOSIS — M6281 Muscle weakness (generalized): Secondary | ICD-10-CM | POA: Diagnosis not present

## 2023-11-06 DIAGNOSIS — F331 Major depressive disorder, recurrent, moderate: Secondary | ICD-10-CM | POA: Diagnosis not present

## 2023-11-10 DIAGNOSIS — R278 Other lack of coordination: Secondary | ICD-10-CM | POA: Diagnosis not present

## 2023-11-10 DIAGNOSIS — M6281 Muscle weakness (generalized): Secondary | ICD-10-CM | POA: Diagnosis not present

## 2023-11-11 DIAGNOSIS — M6281 Muscle weakness (generalized): Secondary | ICD-10-CM | POA: Diagnosis not present

## 2023-11-12 DIAGNOSIS — M6281 Muscle weakness (generalized): Secondary | ICD-10-CM | POA: Diagnosis not present

## 2023-11-12 DIAGNOSIS — R278 Other lack of coordination: Secondary | ICD-10-CM | POA: Diagnosis not present

## 2023-11-13 DIAGNOSIS — M6281 Muscle weakness (generalized): Secondary | ICD-10-CM | POA: Diagnosis not present

## 2023-11-17 DIAGNOSIS — R278 Other lack of coordination: Secondary | ICD-10-CM | POA: Diagnosis not present

## 2023-11-17 DIAGNOSIS — M6281 Muscle weakness (generalized): Secondary | ICD-10-CM | POA: Diagnosis not present

## 2023-11-18 DIAGNOSIS — M6281 Muscle weakness (generalized): Secondary | ICD-10-CM | POA: Diagnosis not present

## 2023-11-20 DIAGNOSIS — M6281 Muscle weakness (generalized): Secondary | ICD-10-CM | POA: Diagnosis not present

## 2023-11-21 DIAGNOSIS — R278 Other lack of coordination: Secondary | ICD-10-CM | POA: Diagnosis not present

## 2023-11-21 DIAGNOSIS — M6281 Muscle weakness (generalized): Secondary | ICD-10-CM | POA: Diagnosis not present

## 2023-11-24 DIAGNOSIS — M6281 Muscle weakness (generalized): Secondary | ICD-10-CM | POA: Diagnosis not present

## 2023-11-25 DIAGNOSIS — M6281 Muscle weakness (generalized): Secondary | ICD-10-CM | POA: Diagnosis not present

## 2023-11-25 DIAGNOSIS — R278 Other lack of coordination: Secondary | ICD-10-CM | POA: Diagnosis not present

## 2023-11-26 DIAGNOSIS — M6281 Muscle weakness (generalized): Secondary | ICD-10-CM | POA: Diagnosis not present

## 2023-11-26 DIAGNOSIS — R278 Other lack of coordination: Secondary | ICD-10-CM | POA: Diagnosis not present

## 2023-11-27 DIAGNOSIS — M6281 Muscle weakness (generalized): Secondary | ICD-10-CM | POA: Diagnosis not present

## 2023-12-01 DIAGNOSIS — M6281 Muscle weakness (generalized): Secondary | ICD-10-CM | POA: Diagnosis not present

## 2023-12-01 DIAGNOSIS — R278 Other lack of coordination: Secondary | ICD-10-CM | POA: Diagnosis not present

## 2023-12-02 DIAGNOSIS — M6281 Muscle weakness (generalized): Secondary | ICD-10-CM | POA: Diagnosis not present

## 2023-12-02 DIAGNOSIS — R278 Other lack of coordination: Secondary | ICD-10-CM | POA: Diagnosis not present

## 2023-12-03 DIAGNOSIS — M6281 Muscle weakness (generalized): Secondary | ICD-10-CM | POA: Diagnosis not present

## 2023-12-04 DIAGNOSIS — M6281 Muscle weakness (generalized): Secondary | ICD-10-CM | POA: Diagnosis not present

## 2023-12-04 DIAGNOSIS — F419 Anxiety disorder, unspecified: Secondary | ICD-10-CM | POA: Diagnosis not present

## 2023-12-04 DIAGNOSIS — F5101 Primary insomnia: Secondary | ICD-10-CM | POA: Diagnosis not present

## 2023-12-04 DIAGNOSIS — F331 Major depressive disorder, recurrent, moderate: Secondary | ICD-10-CM | POA: Diagnosis not present

## 2023-12-05 DIAGNOSIS — R278 Other lack of coordination: Secondary | ICD-10-CM | POA: Diagnosis not present

## 2023-12-05 DIAGNOSIS — M6281 Muscle weakness (generalized): Secondary | ICD-10-CM | POA: Diagnosis not present

## 2023-12-07 DIAGNOSIS — M6281 Muscle weakness (generalized): Secondary | ICD-10-CM | POA: Diagnosis not present

## 2023-12-08 DIAGNOSIS — M6281 Muscle weakness (generalized): Secondary | ICD-10-CM | POA: Diagnosis not present

## 2023-12-08 DIAGNOSIS — R278 Other lack of coordination: Secondary | ICD-10-CM | POA: Diagnosis not present

## 2023-12-09 DIAGNOSIS — M6281 Muscle weakness (generalized): Secondary | ICD-10-CM | POA: Diagnosis not present

## 2023-12-10 DIAGNOSIS — R278 Other lack of coordination: Secondary | ICD-10-CM | POA: Diagnosis not present

## 2023-12-10 DIAGNOSIS — M6281 Muscle weakness (generalized): Secondary | ICD-10-CM | POA: Diagnosis not present

## 2023-12-13 ENCOUNTER — Other Ambulatory Visit: Payer: Self-pay

## 2023-12-13 ENCOUNTER — Encounter (HOSPITAL_COMMUNITY): Payer: Self-pay

## 2023-12-13 ENCOUNTER — Observation Stay (HOSPITAL_COMMUNITY): Admission: EM | Admit: 2023-12-13 | Discharge: 2023-12-14 | Attending: Internal Medicine | Admitting: Internal Medicine

## 2023-12-13 ENCOUNTER — Emergency Department (HOSPITAL_COMMUNITY)

## 2023-12-13 DIAGNOSIS — E1151 Type 2 diabetes mellitus with diabetic peripheral angiopathy without gangrene: Secondary | ICD-10-CM | POA: Diagnosis not present

## 2023-12-13 DIAGNOSIS — Z7984 Long term (current) use of oral hypoglycemic drugs: Secondary | ICD-10-CM | POA: Diagnosis not present

## 2023-12-13 DIAGNOSIS — R079 Chest pain, unspecified: Secondary | ICD-10-CM | POA: Diagnosis not present

## 2023-12-13 DIAGNOSIS — K551 Chronic vascular disorders of intestine: Secondary | ICD-10-CM | POA: Insufficient documentation

## 2023-12-13 DIAGNOSIS — I700 Atherosclerosis of aorta: Secondary | ICD-10-CM | POA: Diagnosis present

## 2023-12-13 DIAGNOSIS — E1142 Type 2 diabetes mellitus with diabetic polyneuropathy: Secondary | ICD-10-CM | POA: Diagnosis not present

## 2023-12-13 DIAGNOSIS — Z7982 Long term (current) use of aspirin: Secondary | ICD-10-CM | POA: Diagnosis not present

## 2023-12-13 DIAGNOSIS — I10 Essential (primary) hypertension: Secondary | ICD-10-CM | POA: Diagnosis not present

## 2023-12-13 DIAGNOSIS — R0789 Other chest pain: Secondary | ICD-10-CM | POA: Diagnosis not present

## 2023-12-13 DIAGNOSIS — E119 Type 2 diabetes mellitus without complications: Secondary | ICD-10-CM | POA: Diagnosis not present

## 2023-12-13 DIAGNOSIS — Z79899 Other long term (current) drug therapy: Secondary | ICD-10-CM | POA: Insufficient documentation

## 2023-12-13 DIAGNOSIS — F411 Generalized anxiety disorder: Secondary | ICD-10-CM | POA: Insufficient documentation

## 2023-12-13 DIAGNOSIS — R0602 Shortness of breath: Secondary | ICD-10-CM | POA: Diagnosis not present

## 2023-12-13 DIAGNOSIS — R791 Abnormal coagulation profile: Secondary | ICD-10-CM | POA: Insufficient documentation

## 2023-12-13 DIAGNOSIS — Z96651 Presence of right artificial knee joint: Secondary | ICD-10-CM | POA: Diagnosis not present

## 2023-12-13 DIAGNOSIS — K219 Gastro-esophageal reflux disease without esophagitis: Secondary | ICD-10-CM | POA: Diagnosis not present

## 2023-12-13 DIAGNOSIS — R918 Other nonspecific abnormal finding of lung field: Secondary | ICD-10-CM | POA: Diagnosis not present

## 2023-12-13 DIAGNOSIS — E785 Hyperlipidemia, unspecified: Secondary | ICD-10-CM | POA: Insufficient documentation

## 2023-12-13 DIAGNOSIS — E1159 Type 2 diabetes mellitus with other circulatory complications: Secondary | ICD-10-CM | POA: Diagnosis present

## 2023-12-13 DIAGNOSIS — E1169 Type 2 diabetes mellitus with other specified complication: Secondary | ICD-10-CM | POA: Insufficient documentation

## 2023-12-13 DIAGNOSIS — R0689 Other abnormalities of breathing: Secondary | ICD-10-CM | POA: Diagnosis not present

## 2023-12-13 LAB — COMPREHENSIVE METABOLIC PANEL WITH GFR
ALT: 17 U/L (ref 0–44)
AST: 18 U/L (ref 15–41)
Albumin: 3.9 g/dL (ref 3.5–5.0)
Alkaline Phosphatase: 86 U/L (ref 38–126)
Anion gap: 12 (ref 5–15)
BUN: 20 mg/dL (ref 8–23)
CO2: 23 mmol/L (ref 22–32)
Calcium: 9.6 mg/dL (ref 8.9–10.3)
Chloride: 99 mmol/L (ref 98–111)
Creatinine, Ser: 0.84 mg/dL (ref 0.44–1.00)
GFR, Estimated: >60 mL/min (ref >60)
Glucose, Bld: 140 mg/dL — ABNORMAL HIGH (ref 70–99)
Potassium: 4.2 mmol/L (ref 3.5–5.1)
Sodium: 134 mmol/L — ABNORMAL LOW (ref 135–145)
Total Bilirubin: 0.8 mg/dL (ref 0.0–1.2)
Total Protein: 6.7 g/dL (ref 6.5–8.1)

## 2023-12-13 LAB — CBC WITH DIFFERENTIAL/PLATELET
Abs Immature Granulocytes: 0.02 10*3/uL (ref 0.00–0.07)
Basophils Absolute: 0 10*3/uL (ref 0.0–0.1)
Basophils Relative: 1 %
Eosinophils Absolute: 0.1 10*3/uL (ref 0.0–0.5)
Eosinophils Relative: 2 %
HCT: 33.8 % — ABNORMAL LOW (ref 36.0–46.0)
Hemoglobin: 11.4 g/dL — ABNORMAL LOW (ref 12.0–15.0)
Immature Granulocytes: 0 %
Lymphocytes Relative: 21 %
Lymphs Abs: 1.5 10*3/uL (ref 0.7–4.0)
MCH: 30 pg (ref 26.0–34.0)
MCHC: 33.7 g/dL (ref 30.0–36.0)
MCV: 88.9 fL (ref 80.0–100.0)
Monocytes Absolute: 0.6 10*3/uL (ref 0.1–1.0)
Monocytes Relative: 9 %
Neutro Abs: 4.7 10*3/uL (ref 1.7–7.7)
Neutrophils Relative %: 67 %
Platelets: 277 10*3/uL (ref 150–400)
RBC: 3.8 MIL/uL — ABNORMAL LOW (ref 3.87–5.11)
RDW: 13.7 % (ref 11.5–15.5)
WBC: 7 10*3/uL (ref 4.0–10.5)
nRBC: 0 % (ref 0.0–0.2)

## 2023-12-13 LAB — TROPONIN I (HIGH SENSITIVITY)
Troponin I (High Sensitivity): 7 ng/L (ref <18)
Troponin I (High Sensitivity): 7 ng/L (ref <18)
Troponin I (High Sensitivity): 9 ng/L (ref <18)

## 2023-12-13 LAB — D-DIMER, QUANTITATIVE: D-Dimer, Quant: 0.63 ug{FEU}/mL — ABNORMAL HIGH (ref 0.00–0.50)

## 2023-12-13 MED ORDER — MELATONIN 3 MG PO TABS
9.0000 mg | ORAL_TABLET | Freq: Every day | ORAL | Status: DC
  Filled 2023-12-13: qty 3

## 2023-12-13 MED ORDER — HYDRALAZINE HCL 25 MG PO TABS
50.0000 mg | ORAL_TABLET | Freq: Three times a day (TID) | ORAL | Status: DC
  Administered 2023-12-14: 50 mg via ORAL
  Filled 2023-12-13 (×2): qty 2

## 2023-12-13 MED ORDER — ASPIRIN 81 MG PO CHEW
81.0000 mg | CHEWABLE_TABLET | Freq: Every day | ORAL | Status: DC
  Administered 2023-12-14: 81 mg via ORAL
  Filled 2023-12-13: qty 1

## 2023-12-13 MED ORDER — POLYVINYL ALCOHOL 1.4 % OP SOLN
1.0000 [drp] | Freq: Two times a day (BID) | OPHTHALMIC | Status: DC
  Administered 2023-12-14: 1 [drp] via OPHTHALMIC
  Filled 2023-12-13: qty 15

## 2023-12-13 MED ORDER — ACETAMINOPHEN 325 MG PO TABS: 650.0000 mg | ORAL_TABLET | Freq: Four times a day (QID) | ORAL | Status: DC | PRN

## 2023-12-13 MED ORDER — LABETALOL HCL 5 MG/ML IV SOLN
20.0000 mg | Freq: Once | INTRAVENOUS | Status: AC
  Filled 2023-12-13: qty 4

## 2023-12-13 MED ORDER — ALUM & MAG HYDROXIDE-SIMETH 200-200-20 MG/5ML PO SUSP
30.0000 mL | Freq: Once | ORAL | Status: AC
  Filled 2023-12-13: qty 30

## 2023-12-13 MED ORDER — SERTRALINE HCL 50 MG PO TABS
25.0000 mg | ORAL_TABLET | Freq: Every day | ORAL | Status: DC
  Administered 2023-12-14: 25 mg via ORAL
  Filled 2023-12-13: qty 1

## 2023-12-13 MED ORDER — PANTOPRAZOLE SODIUM 40 MG PO TBEC
40.0000 mg | DELAYED_RELEASE_TABLET | Freq: Every day | ORAL | Status: DC
  Administered 2023-12-14: 40 mg via ORAL
  Filled 2023-12-13: qty 1

## 2023-12-13 MED ORDER — IRBESARTAN 150 MG PO TABS
75.0000 mg | ORAL_TABLET | Freq: Every day | ORAL | Status: DC
  Administered 2023-12-14: 75 mg via ORAL
  Filled 2023-12-13: qty 1

## 2023-12-13 MED ORDER — ALBUTEROL SULFATE (2.5 MG/3ML) 0.083% IN NEBU: 3.0000 mL | INHALATION_SOLUTION | Freq: Four times a day (QID) | RESPIRATORY_TRACT | Status: DC | PRN

## 2023-12-13 MED ORDER — PANTOPRAZOLE SODIUM 40 MG IV SOLR
40.0000 mg | Freq: Once | INTRAVENOUS | Status: AC
  Filled 2023-12-13: qty 10

## 2023-12-13 MED ORDER — ENOXAPARIN SODIUM 40 MG/0.4ML IJ SOSY
40.0000 mg | PREFILLED_SYRINGE | INTRAMUSCULAR | Status: DC
  Administered 2023-12-14: 40 mg via SUBCUTANEOUS
  Filled 2023-12-13: qty 0.4

## 2023-12-13 MED ORDER — LORATADINE 10 MG PO TABS
10.0000 mg | ORAL_TABLET | Freq: Every day | ORAL | Status: DC
  Administered 2023-12-14: 10 mg via ORAL
  Filled 2023-12-13: qty 1

## 2023-12-13 MED ORDER — MORPHINE SULFATE (PF) 2 MG/ML IV SOLN
2.0000 mg | Freq: Once | INTRAVENOUS | Status: AC
  Administered 2023-12-13: 2 mg via INTRAVENOUS
  Filled 2023-12-13: qty 1

## 2023-12-13 MED ORDER — AMLODIPINE BESYLATE 5 MG PO TABS
10.0000 mg | ORAL_TABLET | Freq: Every day | ORAL | Status: DC
  Administered 2023-12-14: 10 mg via ORAL
  Filled 2023-12-13: qty 2

## 2023-12-13 MED ORDER — HYDRALAZINE HCL 20 MG/ML IJ SOLN
5.0000 mg | Freq: Once | INTRAMUSCULAR | Status: AC
  Filled 2023-12-13: qty 1

## 2023-12-13 MED ORDER — ALPRAZOLAM 0.25 MG PO TABS
0.2500 mg | ORAL_TABLET | Freq: Every day | ORAL | Status: DC
  Filled 2023-12-13: qty 1

## 2023-12-13 MED ORDER — CYCLOSPORINE 0.05 % OP EMUL
1.0000 [drp] | Freq: Two times a day (BID) | OPHTHALMIC | Status: DC
  Administered 2023-12-14: 1 [drp] via OPHTHALMIC
  Filled 2023-12-13: qty 30

## 2023-12-13 MED ORDER — LIDOCAINE VISCOUS HCL 2 % MT SOLN
15.0000 mL | Freq: Once | OROMUCOSAL | Status: AC
  Filled 2023-12-13: qty 15

## 2023-12-13 MED ORDER — ALPRAZOLAM 0.25 MG PO TABS: 0.2500 mg | ORAL_TABLET | Freq: Every day | ORAL | Status: DC

## 2023-12-13 NOTE — ED Notes (Signed)
 [facility]  staff Updated on pt admission status

## 2023-12-13 NOTE — H&P (Signed)
 History and Physical    [name]:[id number] DOB: 1932/02/22 DOA: 12/13/2023  Referring MD/NP/PA: ED P PCP:  Patient coming from: Assisted living  Chief Complaint: Chest pain  HPI: [name] is a 89/F with history of type 2 diabetes mellitus, hypertension, dyslipidemia, GERD, mesenteric artery stenosis and stent presented to the ED today with chest pain, she reports intermittent chest pain that started 1 to 2 days ago, pain is across the middle of her chest, sometimes radiating to both her arms, she also had some belching today. - Denied abdominal pain nausea vomiting, given nitro en route per EMS, mild improvement in symptoms reported ED Course: BP elevated, peaked at 200/69, other vital signs stable, labs unremarkable, troponin negative, EKG unremarkable, mild atelectasis on x-ray  Review of Systems: As per HPI otherwise 14 point review of systems negative.   Past Medical History:  Diagnosis Date   Blood transfusion without reported diagnosis    Cognitive dysfunction 03/04/2019   Patient scores 21 out of 30 on [place] cognitive assessment September 2020   Diabetes mellitus without complication (HCC)    Diabetic peripheral neuropathy associated with type 2 diabetes mellitus (HCC) 02/25/2022   Diverticulitis    Frailty 03/04/2019   H/O bilateral breast reduction surgery    Hyperlipidemia    a. intolerant to statins.    Hypertension     Past Surgical History:  Procedure Laterality Date   ABDOMINAL HYSTERECTOMY  2003   APPENDECTOMY     age 60   BIOPSY  04/10/2023   Procedure: BIOPSY;  Surgeon: Hargis Lias, MD;  Location: [facility];  Service: Endoscopy;;   BREAST REDUCTION SURGERY     age 34   COLON SURGERY Left 2011   Hemicolectomy due to diverticulitis   ESOPHAGOGASTRODUODENOSCOPY (EGD) WITH PROPOFOL  N/A 04/10/2023   Procedure: ESOPHAGOGASTRODUODENOSCOPY (EGD) WITH PROPOFOL ;  Surgeon: Hargis Lias, MD;  Location: [facility];  Service:  Endoscopy;  Laterality: N/A;   EYE SURGERY  03/30/2009   cataract   IR ANGIOGRAM VISCERAL SELECTIVE  04/11/2023   IR INTRAVASCULAR ULTRASOUND NON CORONARY  04/11/2023   IR TRANSCATH PLC STENT 1ST ART NOT LE CV CAR VERT CAR  04/11/2023   IR US  GUIDE VASC ACCESS RIGHT  04/11/2023   KNEE SURGERY Right 2003   knee cap   LAPAROSCOPIC INCISIONAL / UMBILICAL / VENTRAL HERNIA REPAIR  02/23/2007   REDUCTION MAMMAPLASTY Bilateral 2001   REFRACTIVE SURGERY     TOTAL KNEE ARTHROPLASTY Right 04/10/2022   Procedure: TOTAL KNEE ARTHROPLASTY;  Surgeon: Genevie Kerns, MD;  Location: [facility];  Service: Orthopedics;  Laterality: Right;  adductor canal     reports that she has never smoked. She has never used smokeless tobacco. She reports that she does not drink alcohol  and does not use drugs.  Allergies  Allergen Reactions   Hydrocortisone Itching   Lisinopril     Other reaction(s): Lethargy (intolerance)   Phenergan [Promethazine Hcl] Other (See Comments)    hallucinations   Statins Other (See Comments)    Severe myalgias   Trazodone  And Nefazodone Cough   Prednisone  Rash    Family History  Problem Relation Age of Onset   Cancer Mother        ovaian   Hypertension Father        kidney   Stroke Father    Cancer Father    Hypertension Sister    Diabetes Brother      Prior to Admission medications  Medication Sig Start Date End Date Taking? Authorizing Provider  acetaminophen  (TYLENOL ) 325 MG tablet TAKE (2) TABLETS BY MOUTH EVERY SIX HOURS AS NEEDED FOR PAIN. MAX 3GM IN 24 HOURS. Patient taking differently: Take 650 mg by mouth every 6 (six) hours as needed for mild pain (pain score 1-3). 07/28/23  Yes Luking, Jackelyn Marvel, MD  albuterol  (VENTOLIN  HFA) 108 (90 Base) MCG/ACT inhaler INHALE 2 PUFFS INTO THE LUNGS EVERY SIX HOURS AS NEEDED FOR WHEEZING OR SHORTNESS OF BREATH. 09/26/23  Yes Luking, Jackelyn Marvel, MD  ALPRAZolam  (XANAX ) 0.25 MG tablet Take 1 tablet (0.25 mg total) by mouth at bedtime.  10/08/23  Yes Luking, Jackelyn Marvel, MD  amLODipine  (NORVASC ) 10 MG tablet TAKE (1) TABLET BY MOUTH ONCE DAILY. Patient taking differently: Take 10 mg by mouth daily. 10/07/23  Yes Luking, Jackelyn Marvel, MD  aspirin  (ASPIRIN  LOW DOSE) 81 MG chewable tablet CHEW (1) TABLET BY MOUTH ONCE DAILY. Patient taking differently: Chew 81 mg by mouth daily. 09/06/23  Yes Luking, Jackelyn Marvel, MD  beta carotene (BETA CAROTENE PROVITAMIN A) 25000 UNIT capsule TAKE (1) CAPSULE BY MOUTH AT BEDTIME. Patient taking differently: Take 25,000 Units by mouth daily. 10/07/23  Yes Luking, Jackelyn Marvel, MD  BISACODYL  5 MG EC tablet TAKE 1 TABLET BY MOUTH ONCE DAILY AS NEEDED FOR MODERATE CONSTIPATION. Patient taking differently: Take 5 mg by mouth daily as needed for moderate constipation. 12/20/22  Yes Bennet Brasil, MD  cyanocobalamin  1000 MCG tablet Take 1,000 mcg by mouth at bedtime.   Yes [provider]  cycloSPORINE  (RESTASIS ) 0.05 % ophthalmic emulsion Place 1 drop into both eyes 2 (two) times daily.   Yes [provider]  dicyclomine  (BENTYL ) 10 MG capsule TAKE 1 CAPSULE BY MOUTH TWICE DAILY AS NEEDED FOR ABDOMINAL CRAMPS AND LOOSE STOOL. Patient taking differently: Take 10 mg by mouth 2 (two) times daily as needed (abdominal cramps and loose stool). TAKE 1 CAPSULE BY MOUTH TWICE DAILY AS NEEDED FOR ABDOMINAL CRAMPS AND LOOSE STOOL. 11/21/22  Yes Luking, Jackelyn Marvel, MD  ferrous sulfate  (FEROSUL) 325 (65 FE) MG tablet TAKE (1) TABLET BY MOUTH EVERY OTHER DAY. Patient taking differently: Take 325 mg by mouth every other day. 10/07/23  Yes Luking, Jackelyn Marvel, MD  hydrALAZINE  (APRESOLINE ) 50 MG tablet TAKE (1) TABLET BY MOUTH EVERY EIGHT HOURS. Patient taking differently: Take 50 mg by mouth every 8 (eight) hours. 11/03/23  Yes Luking, Jackelyn Marvel, MD  IVIZIA DRY EYES 0.5 % SOLN Place 1 drop into both eyes 2 (two) times daily. 09/22/23  Yes [provider]  loratadine  (ALLERGY RELIEF) 10 MG tablet TAKE (1) TABLET BY MOUTH ONCE  DAILY. Patient taking differently: Take 10 mg by mouth daily. 10/07/23  Yes Luking, Jackelyn Marvel, MD  Melatonin 10 MG TABS Take 10 mg by mouth at bedtime. 09/25/23  Yes Cook, Jayce G, DO  metFORMIN  (GLUCOPHAGE ) 500 MG tablet TAKE (1) TABLET BY MOUTH IN THE MORNING & (1/2) TABLET (250MG ) BY MOUTH AT SUPPER Patient taking differently: Take 500 mg by mouth 2 (two) times daily with a meal. TAKE (1) TABLET BY MOUTH IN THE MORNING & (1/2) TABLET (250MG ) BY MOUTH AT SUPPER 10/07/23  Yes Luking, Jackelyn Marvel, MD  Multiple Vitamins-Minerals (PRESERVISION AREDS 2) CAPS Take 1 capsule by mouth in the morning and at bedtime.   Yes [provider]  ondansetron  (ZOFRAN -ODT) 4 MG disintegrating tablet 4mg  ODT q4 hours prn nausea/vomit Patient taking differently: Take 4 mg by mouth every 4 (  four) hours as needed for nausea or vomiting. 09/05/23  Yes Cheyenne Cotta, MD  pantoprazole  (PROTONIX ) 40 MG tablet TAKE (1) TABLET BY MOUTH DAILY. Patient taking differently: Take 40 mg by mouth daily. 10/07/23  Yes Luking, Jackelyn Marvel, MD  polyethylene glycol powder (GOODSENSE CLEARLAX) 17 GM/SCOOP powder 1 scoop in 8 ounces water  once daily as needed for constipation Patient taking differently: Take 17 g by mouth daily as needed for mild constipation. 1 scoop in 8 ounces water  once daily as needed for constipation 06/03/23  Yes Luking, Scott A, MD  sertraline  (ZOLOFT ) 25 MG tablet TAKE (1) TABLET BY MOUTH ONCE DAILY. Patient taking differently: Take 25 mg by mouth daily. 10/07/23  Yes Bennet Brasil, MD  sodium chloride  1 g tablet Take 1 tablet (1 g total) by mouth daily. 10/07/23  Yes Bennet Brasil, MD  valsartan  (DIOVAN ) 160 MG tablet Take 1 tablet (160 mg total) by mouth daily. 08/25/23  Yes Luking, Jackelyn Marvel, MD  Vitamin D , Cholecalciferol , 10 MCG (400 UNIT) TABS TAKE (1) TABLET BY MOUTH ONCE DAILY. Patient taking differently: Take 1 tablet by mouth daily. 10/07/23  Yes Luking, Scott A, MD  glucose blood (EASYMAX TEST) test strip CHECK  BLOOD SUGAR ONCE DAILY.(CALL MD IF BS BELOW 60: OR IF BS ABOVE 400) 10/07/23   Bennet Brasil, MD    Physical Exam: Vitals:   12/13/23 1745 12/13/23 1800 12/13/23 1900 12/13/23 2106  BP: (!) 189/75 (!) 194/69 (!) 178/62 (!) 200/69  Pulse: 90 85 87 86  Resp: (!) 21 18 19 16   Temp:      TempSrc:      SpO2: 95% 94% 98% 92%  Weight:      Height:          Constitutional: NAD, calm, comfortable, pleasant, AAO x 3 HEENT:no JVD Respiratory: clear to auscultation bilaterally Cardiovascular: S1S2/RRR Abdomen: soft, non tender, Bowel sounds positive.  Musculoskeletal: No joint deformity upper and lower extremities. Ext: no edema Skin: no rashes Psychiatric: Normal judgment and insight. Alert and oriented x 3. Normal mood.   Labs on Admission: I have personally reviewed following labs and imaging studies  CBC: Recent Labs  Lab 12/13/23 1646  WBC 7.0  NEUTROABS 4.7  HGB 11.4*  HCT 33.8*  MCV 88.9  PLT 277   Basic Metabolic Panel: Recent Labs  Lab 12/13/23 1646  NA 134*  K 4.2  CL 99  CO2 23  GLUCOSE 140*  BUN 20  CREATININE 0.84  CALCIUM 9.6   GFR: Estimated Creatinine Clearance: 48.5 mL/min (by C-G formula based on SCr of 0.84 mg/dL). Liver Function Tests: Recent Labs  Lab 12/13/23 1646  AST 18  ALT 17  ALKPHOS 86  BILITOT 0.8  PROT 6.7  ALBUMIN 3.9   No results for input(s): LIPASE, AMYLASE in the last 168 hours. No results for input(s): AMMONIA in the last 168 hours. Coagulation Profile: No results for input(s): INR, PROTIME in the last 168 hours. Cardiac Enzymes: No results for input(s): CKTOTAL, CKMB, CKMBINDEX, TROPONINI in the last 168 hours. BNP (last 3 results) No results for input(s): PROBNP in the last 8760 hours. HbA1C: No results for input(s): HGBA1C in the last 72 hours. CBG: No results for input(s): GLUCAP in the last 168 hours. Lipid Profile: No results for input(s): CHOL, HDL, LDLCALC, TRIG, CHOLHDL,  LDLDIRECT in the last 72 hours. Thyroid  Function Tests: No results for input(s): TSH, T4TOTAL, FREET4, T3FREE, THYROIDAB in the last 72 hours. Anemia Panel: No  results for input(s): VITAMINB12, FOLATE, FERRITIN, TIBC, IRON , RETICCTPCT in the last 72 hours. Urine analysis:    Component Value Date/Time   COLORURINE STRAW (A) 09/22/2023 1828   APPEARANCEUR CLEAR 09/22/2023 1828   APPEARANCEUR Clear 05/21/2023 1333   LABSPEC 1.009 09/22/2023 1828   PHURINE 7.0 09/22/2023 1828   GLUCOSEU NEGATIVE 09/22/2023 1828   HGBUR NEGATIVE 09/22/2023 1828   BILIRUBINUR NEGATIVE 09/22/2023 1828   BILIRUBINUR negative 08/25/2023 0913   BILIRUBINUR Negative 05/21/2023 1333   KETONESUR NEGATIVE 09/22/2023 1828   PROTEINUR 100 (A) 09/22/2023 1828   UROBILINOGEN 0.2 08/25/2023 0913   UROBILINOGEN 0.2 12/15/2014 1956   NITRITE NEGATIVE 09/22/2023 1828   LEUKOCYTESUR Large (3+) (A) 10/08/2023 1443   LEUKOCYTESUR NEGATIVE 09/22/2023 1828   Sepsis Labs: @LABRCNTIP (procalcitonin:4,lacticidven:4) )No results found for this or any previous visit (from the past 240 hours).   Radiological Exams on Admission: DG Chest Port 1 View Result Date: 12/13/2023 CLINICAL DATA:  Shortness of breath, chest pain, and hypertension. EXAM: PORTABLE CHEST 1 VIEW COMPARISON:  09/22/2023. FINDINGS: The heart size and mediastinal contours are within normal limits. There is atherosclerotic calcification of the aorta. Mild atelectasis or scarring is present at the lung bases. No effusion or pneumothorax is seen. No acute osseous abnormality. IMPRESSION: Mild atelectasis or scarring at the lung bases. No acute infiltrate is seen. Electronically Signed   By: Wyvonnia Heimlich M.D.   On: 12/13/2023 17:10    EKG: Independently reviewed.  Normal sinus rhythm, no acute ST-T wave changes  Assessment/Plan Principal Problem:    Chest pain - Symptoms are atypical, possibly related to GERD -Has been drinking propel  water  as well as boost every day, potentially contributing -EKG unremarkable, troponin negative so far, will repeat -On account of age and cardiac risk factors will check 2D echo in a.m. -Do not anticipate need for further workup if echo is unremarkable -Continue PPI     Type 2 diabetes mellitus  - CBGs stable at this time, hold metformin   Uncontrolled hypertension -Will give p.m. dose of hydralazine  now, resume amlodipine  and ARB - May need low-dose diuretic if blood pressure remains elevated tomorrow despite this    Generalized anxiety disorder -Continue SSRI, low-dose Xanax  nightly    Mesenteric artery stenosis (HCC) -History of mesenteric artery stent, continue aspirin  and statin   Minimally elevated D-dimer - Do not think this is clinically significant now  DVT prophylaxis: Lovenox  Code Status: She wishes to be DNR Family Communication: None present Disposition Plan: Home tomorrow if above workup unremarkable Admission status: Observation  Deforest Fast MD [facility]   12/13/2023, 9:39 PM

## 2023-12-13 NOTE — Plan of Care (Signed)

## 2023-12-13 NOTE — ED Triage Notes (Signed)
 Pt arrived from [facility] via [facility] with complains of chest pain and HTN. Pt complains that bp meds are not working. Chest pains started around 3pm and pt states pain was a 10. Given 1 nitroglycerin in route and now rates pain at 7.

## 2023-12-13 NOTE — ED Notes (Signed)
 Patient taken to the restroom via wheelchair. Patient stated she does not feel right.

## 2023-12-14 ENCOUNTER — Observation Stay (HOSPITAL_BASED_OUTPATIENT_CLINIC_OR_DEPARTMENT_OTHER)

## 2023-12-14 DIAGNOSIS — R079 Chest pain, unspecified: Secondary | ICD-10-CM | POA: Diagnosis not present

## 2023-12-14 DIAGNOSIS — R0789 Other chest pain: Secondary | ICD-10-CM | POA: Diagnosis not present

## 2023-12-14 LAB — COMPREHENSIVE METABOLIC PANEL WITH GFR
ALT: 15 U/L (ref 0–44)
AST: 16 U/L (ref 15–41)
Albumin: 3.4 g/dL — ABNORMAL LOW (ref 3.5–5.0)
Alkaline Phosphatase: 70 U/L (ref 38–126)
Anion gap: 11 (ref 5–15)
BUN: 21 mg/dL (ref 8–23)
CO2: 22 mmol/L (ref 22–32)
Calcium: 9 mg/dL (ref 8.9–10.3)
Chloride: 100 mmol/L (ref 98–111)
Creatinine, Ser: 1.01 mg/dL — ABNORMAL HIGH (ref 0.44–1.00)
GFR, Estimated: 53 mL/min — ABNORMAL LOW (ref >60)
Glucose, Bld: 127 mg/dL — ABNORMAL HIGH (ref 70–99)
Potassium: 3.9 mmol/L (ref 3.5–5.1)
Sodium: 133 mmol/L — ABNORMAL LOW (ref 135–145)
Total Bilirubin: 0.7 mg/dL (ref 0.0–1.2)
Total Protein: 6.1 g/dL — ABNORMAL LOW (ref 6.5–8.1)

## 2023-12-14 LAB — ECHOCARDIOGRAM COMPLETE
Area-P 1/2: 2.26 cm2
Height: 68 in
MV M vel: 3.04 m/s
MV Peak grad: 37 mmHg
S' Lateral: 2.4 cm
Weight: 2638.47 [oz_av]

## 2023-12-14 LAB — CBC
HCT: 35.5 % — ABNORMAL LOW (ref 36.0–46.0)
Hemoglobin: 11.2 g/dL — ABNORMAL LOW (ref 12.0–15.0)
MCH: 28.9 pg (ref 26.0–34.0)
MCHC: 31.5 g/dL (ref 30.0–36.0)
MCV: 91.7 fL (ref 80.0–100.0)
Platelets: 270 10*3/uL (ref 150–400)
RBC: 3.87 MIL/uL (ref 3.87–5.11)
RDW: 13.7 % (ref 11.5–15.5)
WBC: 8 10*3/uL (ref 4.0–10.5)
nRBC: 0 % (ref 0.0–0.2)

## 2023-12-14 MED ORDER — ALPRAZOLAM 0.25 MG PO TABS: 0.2500 mg | ORAL_TABLET | Freq: Every day | ORAL | 0 refills | Status: DC

## 2023-12-14 NOTE — Progress Notes (Signed)
 Patient states understanding of discharge instructions and AVS place in discharge packet for [place].

## 2023-12-14 NOTE — Progress Notes (Signed)
  Echocardiogram 2D Echocardiogram has been performed.  Farley Honer, RDCS 12/14/2023, 9:29 AM

## 2023-12-14 NOTE — TOC Transition Note (Signed)
 Transition of Care [facility]) - Discharge Note   Patient Details  Name: [name] MRN: [id number] Date of Birth: [birth year]  Transition of Care [facility]) CM/SW Contact:  Grandville Lax, LCSWA Phone Number: 12/14/2023, 1:02 PM  Clinical Narrative:    CSW notes pt is from [facility]. CSW spoke with Angle at the ALF who states she spoke with director and pt can return today. CSW sent D/C summary and updated Fl2 over to ALF via fax. Pt aware of D/C and ready to return to ALF. ALF will provide transport. TOC signing off.   Final next level of care: Assisted Living Barriers to Discharge: Barriers Resolved   Patient Goals and CMS Choice Patient states their goals for this hospitalization and ongoing recovery are:: return to ALF CMS Medicare.gov Compare Post Acute Care list provided to:: Patient Choice offered to / list presented to : Patient      Discharge Placement                    Patient and family notified of of transfer: 12/14/23  Discharge Plan and Services Additional resources added to the After Visit Summary for                                       Social Drivers of Health (SDOH) Interventions SDOH Screenings   Food Insecurity: No Food Insecurity (12/13/2023)  Housing: Low Risk  (12/13/2023)  Transportation Needs: No Transportation Needs (12/13/2023)  Utilities: Not At Risk (12/13/2023)  Alcohol  Screen: Low Risk  (09/06/2022)  Depression (PHQ2-9): Low Risk  (10/16/2023)  Recent Concern: Depression (PHQ2-9) - Medium Risk (09/25/2023)  Financial Resource Strain: Low Risk  (09/06/2022)  Physical Activity: Sufficiently Active (09/06/2022)  Social Connections: Moderately Isolated (12/13/2023)  Stress: No Stress Concern Present (09/06/2022)  Tobacco Use: Low Risk  (12/13/2023)     Readmission Risk Interventions    09/15/2023    3:04 PM 04/08/2023    2:11 PM  Readmission Risk Prevention Plan  Transportation Screening Complete Complete  PCP or Specialist Appt  within 3-5 Days  Not Complete  HRI or Home Care Consult Complete Complete  Social Work Consult for Recovery Care Planning/Counseling Complete Complete  Palliative Care Screening Not Applicable Not Applicable  Medication Review [employer]) Complete Complete

## 2023-12-14 NOTE — Care Management Obs Status (Signed)
 MEDICARE OBSERVATION STATUS NOTIFICATION   Patient Details  Name: [name] MRN: [id number] Date of Birth: 01-Feb-1932   Medicare Observation Status Notification Given:  Yes    Grandville Lax, LCSWA 12/14/2023, 12:22 PM

## 2023-12-14 NOTE — [place] FL2 (Signed)
 [place]  MEDICAID FL2 LEVEL OF CARE FORM     IDENTIFICATION  Patient Name: [name] Birthdate: 1931-10-29 Sex: female Admission Date (Current Location): 12/13/2023  [facility] and [place] Number:  [employer] and Address:  [facility],  [address], [place] [address]      Provider Number: [id number]  Attending Physician Name and Address:  Cornelius Dill, DO  Relative Name and Phone Number:       Current Level of Care: Hospital Recommended Level of Care: Assisted Living Facility Prior Approval Number:    Date Approved/Denied:   PASRR Number:    Discharge Plan: Other (Comment) (ALF)    Current Diagnoses: Patient Active Problem List   Diagnosis Date Noted   Chest pain 12/13/2023   Acute cystitis without hematuria 10/08/2023   Acute blood loss anemia 04/17/2023   Gastritis and gastroduodenitis 04/10/2023   Abdominal pain, chronic, epigastric 04/08/2023   Mesenteric artery stenosis (HCC) 04/07/2023   Type 2 diabetes mellitus (HCC) 03/10/2023   Diabetic neuropathy (HCC) 11/30/2022   S/P total knee arthroplasty, right 04/12/2022   Aortic atherosclerosis (HCC) 02/25/2022   Chronic non-seasonal allergic rhinitis 02/25/2022   Protein-calorie malnutrition, severe (HCC) 02/25/2022   Chronic anxiety 02/25/2022   GERD (gastroesophageal reflux disease) 02/17/2022   Myalgia due to statin 02/15/2020   Cognitive dysfunction 03/04/2019   Generalized anxiety disorder 06/05/2018   Osteoarthritis of right knee 12/02/2017   Hyperlipidemia associated with type 2 diabetes mellitus (HCC) 10/02/2016   Insomnia 10/02/2016   Essential hypertension 12/15/2014   Type 2 diabetes mellitus with atherosclerosis of aorta (HCC) 02/15/2011    Orientation RESPIRATION BLADDER Height & Weight     Self, Time, Situation, Place  Normal Continent Weight: 164 lb 14.5 oz (74.8 kg) Height:  5' 8 (172.7 cm)  BEHAVIORAL SYMPTOMS/MOOD NEUROLOGICAL BOWEL NUTRITION STATUS       Continent Diet (reduced concentrated sweets, no added table salt)  AMBULATORY STATUS COMMUNICATION OF NEEDS Skin   Limited Assist Verbally Normal                       Personal Care Assistance Level of Assistance  Bathing, Feeding, Dressing Bathing Assistance: Limited assistance Feeding assistance: Independent Dressing Assistance: Limited assistance     Functional Limitations Info  Hearing, Speech, Sight Sight Info: Impaired Hearing Info: Adequate Speech Info: Adequate    SPECIAL CARE FACTORS FREQUENCY                       Contractures Contractures Info: Not present    Additional Factors Info  Code Status, Allergies Code Status Info: DNR-Limited Allergies Info: Hydrocortisone, Lisinopril, Phenergan (Promethazine Hcl), Statins, Trazodone  And Nefazodone, Prednisone            Current Medications (12/14/2023):  This is the current hospital active medication list Current Facility-Administered Medications  Medication Dose Route Frequency Provider Last Rate Last Admin   acetaminophen  (TYLENOL ) tablet 650 mg  650 mg Oral Q6H PRN Deforest Fast, MD       albuterol  (PROVENTIL ) (2.5 MG/3ML) 0.083% nebulizer solution 3 mL  3 mL Inhalation Q6H PRN Deforest Fast, MD       ALPRAZolam  (XANAX ) tablet 0.25 mg  0.25 mg Oral QHS Joseph, Preetha, MD   0.25 mg at 12/13/23 2252   amLODipine  (NORVASC ) tablet 10 mg  10 mg Oral Daily Deforest Fast, MD   10 mg at 12/14/23 0824   artificial tears ophthalmic solution 1 drop  1 drop Both Eyes BID Deforest Fast, MD   1 drop at 12/14/23 1478   aspirin  chewable tablet 81 mg  81 mg Oral Daily Deforest Fast, MD   81 mg at 12/14/23 0825   cycloSPORINE  (RESTASIS ) 0.05 % ophthalmic emulsion 1 drop  1 drop Both Eyes BID Deforest Fast, MD   1 drop at 12/14/23 0826   enoxaparin  (LOVENOX ) injection 40 mg  40 mg Subcutaneous Q24H Joseph, Preetha, MD   40 mg at 12/14/23 0827   hydrALAZINE  (APRESOLINE ) tablet 50 mg  50 mg Oral TID Joseph,  Preetha, MD   50 mg at 12/14/23 2956   irbesartan  (AVAPRO ) tablet 75 mg  75 mg Oral Daily Joseph, Preetha, MD   75 mg at 12/14/23 0825   loratadine  (CLARITIN ) tablet 10 mg  10 mg Oral Daily Joseph, Preetha, MD   10 mg at 12/14/23 0825   melatonin tablet 9 mg  9 mg Oral QHS Deforest Fast, MD   9 mg at 12/13/23 2252   pantoprazole  (PROTONIX ) EC tablet 40 mg  40 mg Oral Daily Joseph, Preetha, MD   40 mg at 12/14/23 0825   sertraline  (ZOLOFT ) tablet 25 mg  25 mg Oral Daily Joseph, Preetha, MD   25 mg at 12/14/23 2130     Discharge Medications: Allergies as of 12/14/2023       Reactions   Hydrocortisone Itching   Lisinopril    Other reaction(s): Lethargy (intolerance)   Phenergan [promethazine Hcl] Other (See Comments)   hallucinations   Statins Other (See Comments)   Severe myalgias   Trazodone  And Nefazodone Cough   Prednisone  Rash        Medication List     TAKE these medications    acetaminophen  325 MG tablet Commonly known as: TYLENOL  TAKE (2) TABLETS BY MOUTH EVERY SIX HOURS AS NEEDED FOR PAIN. MAX 3GM IN 24 HOURS. What changed: See the new instructions.   albuterol  108 (90 Base) MCG/ACT inhaler Commonly known as: VENTOLIN  HFA INHALE 2 PUFFS INTO THE LUNGS EVERY SIX HOURS AS NEEDED FOR WHEEZING OR SHORTNESS OF BREATH.   Allergy Relief 10 MG tablet Generic drug: loratadine  TAKE (1) TABLET BY MOUTH ONCE DAILY. What changed: See the new instructions.   ALPRAZolam  0.25 MG tablet Commonly known as: XANAX  Take 1 tablet (0.25 mg total) by mouth at bedtime.   amLODipine  10 MG tablet Commonly known as: NORVASC  TAKE (1) TABLET BY MOUTH ONCE DAILY. What changed: See the new instructions.   Aspirin  Low Dose 81 MG chewable tablet Generic drug: aspirin  CHEW (1) TABLET BY MOUTH ONCE DAILY. What changed: See the new instructions.   Beta Carotene Provitamin A 25000 units capsule Generic drug: beta carotene TAKE (1) CAPSULE BY MOUTH AT BEDTIME. What changed: See the new  instructions.   bisacodyl  5 MG EC tablet Generic drug: bisacodyl  TAKE 1 TABLET BY MOUTH ONCE DAILY AS NEEDED FOR MODERATE CONSTIPATION. What changed: See the new instructions.   cyanocobalamin  1000 MCG tablet Take 1,000 mcg by mouth at bedtime.   dicyclomine  10 MG capsule Commonly known as: BENTYL  TAKE 1 CAPSULE BY MOUTH TWICE DAILY AS NEEDED FOR ABDOMINAL CRAMPS AND LOOSE STOOL. What changed:  how much to take how to take this when to take this reasons to take this   EasyMax Test test strip Generic drug: glucose blood CHECK BLOOD SUGAR ONCE DAILY.(CALL MD IF BS BELOW 60: OR IF BS ABOVE 400)   FeroSul 325 (65 Fe) MG tablet Generic drug: ferrous sulfate   TAKE (1) TABLET BY MOUTH EVERY OTHER DAY. What changed: See the new instructions.   hydrALAZINE  50 MG tablet Commonly known as: APRESOLINE  TAKE (1) TABLET BY MOUTH EVERY EIGHT HOURS. What changed: See the new instructions.   iVIZIA Dry Eyes 0.5 % Soln Generic drug: Povidone (PF) Place 1 drop into both eyes 2 (two) times daily.   Melatonin 10 MG Tabs Take 10 mg by mouth at bedtime.   metFORMIN  500 MG tablet Commonly known as: GLUCOPHAGE  TAKE (1) TABLET BY MOUTH IN THE MORNING & (1/2) TABLET (250MG ) BY MOUTH AT SUPPER What changed:  how much to take how to take this when to take this   ondansetron  4 MG disintegrating tablet Commonly known as: ZOFRAN -ODT 4mg  ODT q4 hours prn nausea/vomit What changed:  how much to take how to take this when to take this reasons to take this additional instructions   pantoprazole  40 MG tablet Commonly known as: PROTONIX  TAKE (1) TABLET BY MOUTH DAILY. What changed: See the new instructions.   polyethylene glycol powder 17 GM/SCOOP powder Commonly known as: GoodSense ClearLax 1 scoop in 8 ounces water  once daily as needed for constipation What changed:  how much to take how to take this when to take this reasons to take this   PreserVision AREDS 2 Caps Take 1 capsule  by mouth in the morning and at bedtime.   Restasis  0.05 % ophthalmic emulsion Generic drug: cycloSPORINE  Place 1 drop into both eyes 2 (two) times daily.   sertraline  25 MG tablet Commonly known as: ZOLOFT  TAKE (1) TABLET BY MOUTH ONCE DAILY. What changed: See the new instructions.   sodium chloride  1 g tablet Take 1 tablet (1 g total) by mouth daily.   valsartan  160 MG tablet Commonly known as: DIOVAN  Take 1 tablet (160 mg total) by mouth daily.   Vitamin D  (Cholecalciferol ) 10 MCG (400 UNIT) Tabs TAKE (1) TABLET BY MOUTH ONCE DAILY. What changed: See the new instructions.         Relevant Imaging Results:  Relevant Lab Results:   Additional Information SSN: [id number]  [place] Lax, LCSWA

## 2023-12-14 NOTE — Discharge Summary (Signed)
 Physician Discharge Summary  [name] WUJ:[id number] DOB: [birth year] DOA: 12/13/2023  PCP: Bennet Brasil, MD  Admit date: 12/13/2023  Discharge date: 12/14/2023  Admitted From:ALF  Disposition:  ALF  Recommendations for Outpatient Follow-up:  Follow up with PCP in 1-2 weeks Continue home medications as prior and encouraged to use Protonix  Use Xanax  as needed for anxiety  Home Health:None  Equipment/Devices:None  Discharge Condition:Stable  CODE STATUS: DNR  Diet recommendation: Heart Healthy/Carb Modified  Brief/Interim Summary: [name] is a 89/F with history of type 2 diabetes mellitus, hypertension, dyslipidemia, GERD, mesenteric artery stenosis and stent presented to the ED today with chest pain, she reports intermittent chest pain that started 1 to 2 days ago, pain is across the middle of her chest, sometimes radiating to both her arms, she also had some belching today. - Denied abdominal pain nausea vomiting, given nitro en route per EMS, mild improvement in symptoms reported. Patient was admitted for atypical chest pain evaluation and her pain was nonexertional in nature.  It appears that some of her drinks such as propel water  and boost may be contributing to her symptoms.  She has been having a bit of belching and has tried some Rolaids with minimal relief.  She has been encouraged to use her Protonix  at home and has undergone workup during this hospitalization with EKG and troponin within normal limits.  2D echocardiogram with no significant findings as noted below.  She has recently lost her brother of 81 years and appears to be somewhat distraught due to this loss and I have explained to her that this may potentially be contributing to her symptoms.  No other acute events or concerns noted.  Discharge Diagnoses:  Principal Problem:   Chest pain Active Problems:   Type 2 diabetes mellitus with atherosclerosis of aorta (HCC)   Essential hypertension    Hyperlipidemia associated with type 2 diabetes mellitus (HCC)   Generalized anxiety disorder   GERD (gastroesophageal reflux disease)   Mesenteric artery stenosis (HCC)  Principal discharge diagnosis: Atypical chest pain likely related to GERD/anxiety, noncardiac.  Discharge Instructions  Discharge Instructions     Diet - low sodium heart healthy   Complete by: As directed    Increase activity slowly   Complete by: As directed       Allergies as of 12/14/2023       Reactions   Hydrocortisone Itching   Lisinopril    Other reaction(s): Lethargy (intolerance)   Phenergan [promethazine Hcl] Other (See Comments)   hallucinations   Statins Other (See Comments)   Severe myalgias   Trazodone  And Nefazodone Cough   Prednisone  Rash        Medication List     TAKE these medications    acetaminophen  325 MG tablet Commonly known as: TYLENOL  TAKE (2) TABLETS BY MOUTH EVERY SIX HOURS AS NEEDED FOR PAIN. MAX 3GM IN 24 HOURS. What changed: See the new instructions.   albuterol  108 (90 Base) MCG/ACT inhaler Commonly known as: VENTOLIN  HFA INHALE 2 PUFFS INTO THE LUNGS EVERY SIX HOURS AS NEEDED FOR WHEEZING OR SHORTNESS OF BREATH.   Allergy Relief 10 MG tablet Generic drug: loratadine  TAKE (1) TABLET BY MOUTH ONCE DAILY. What changed: See the new instructions.   ALPRAZolam  0.25 MG tablet Commonly known as: XANAX  Take 1 tablet (0.25 mg total) by mouth at bedtime.   amLODipine  10 MG tablet Commonly known as: NORVASC  TAKE (1) TABLET BY MOUTH ONCE DAILY. What changed: See the new instructions.  Aspirin  Low Dose 81 MG chewable tablet Generic drug: aspirin  CHEW (1) TABLET BY MOUTH ONCE DAILY. What changed: See the new instructions.   Beta Carotene Provitamin A 25000 units capsule Generic drug: beta carotene TAKE (1) CAPSULE BY MOUTH AT BEDTIME. What changed: See the new instructions.   bisacodyl  5 MG EC tablet Generic drug: bisacodyl  TAKE 1 TABLET BY MOUTH ONCE DAILY  AS NEEDED FOR MODERATE CONSTIPATION. What changed: See the new instructions.   cyanocobalamin  1000 MCG tablet Take 1,000 mcg by mouth at bedtime.   dicyclomine  10 MG capsule Commonly known as: BENTYL  TAKE 1 CAPSULE BY MOUTH TWICE DAILY AS NEEDED FOR ABDOMINAL CRAMPS AND LOOSE STOOL. What changed:  how much to take how to take this when to take this reasons to take this   EasyMax Test test strip Generic drug: glucose blood CHECK BLOOD SUGAR ONCE DAILY.(CALL MD IF BS BELOW 60: OR IF BS ABOVE 400)   FeroSul 325 (65 Fe) MG tablet Generic drug: ferrous sulfate  TAKE (1) TABLET BY MOUTH EVERY OTHER DAY. What changed: See the new instructions.   hydrALAZINE  50 MG tablet Commonly known as: APRESOLINE  TAKE (1) TABLET BY MOUTH EVERY EIGHT HOURS. What changed: See the new instructions.   iVIZIA Dry Eyes 0.5 % Soln Generic drug: Povidone (PF) Place 1 drop into both eyes 2 (two) times daily.   Melatonin 10 MG Tabs Take 10 mg by mouth at bedtime.   metFORMIN  500 MG tablet Commonly known as: GLUCOPHAGE  TAKE (1) TABLET BY MOUTH IN THE MORNING & (1/2) TABLET (250MG ) BY MOUTH AT SUPPER What changed:  how much to take how to take this when to take this   ondansetron  4 MG disintegrating tablet Commonly known as: ZOFRAN -ODT 4mg  ODT q4 hours prn nausea/vomit What changed:  how much to take how to take this when to take this reasons to take this additional instructions   pantoprazole  40 MG tablet Commonly known as: PROTONIX  TAKE (1) TABLET BY MOUTH DAILY. What changed: See the new instructions.   polyethylene glycol powder 17 GM/SCOOP powder Commonly known as: GoodSense ClearLax 1 scoop in 8 ounces water  once daily as needed for constipation What changed:  how much to take how to take this when to take this reasons to take this   PreserVision AREDS 2 Caps Take 1 capsule by mouth in the morning and at bedtime.   Restasis  0.05 % ophthalmic emulsion Generic drug:  cycloSPORINE  Place 1 drop into both eyes 2 (two) times daily.   sertraline  25 MG tablet Commonly known as: ZOLOFT  TAKE (1) TABLET BY MOUTH ONCE DAILY. What changed: See the new instructions.   sodium chloride  1 g tablet Take 1 tablet (1 g total) by mouth daily.   valsartan  160 MG tablet Commonly known as: DIOVAN  Take 1 tablet (160 mg total) by mouth daily.   Vitamin D  (Cholecalciferol ) 10 MCG (400 UNIT) Tabs TAKE (1) TABLET BY MOUTH ONCE DAILY. What changed: See the new instructions.        Follow-up Information     Luking, Jackelyn Marvel, MD. Schedule an appointment as soon as possible for a visit in 1 week(s).   Specialty: Family Medicine Contact information: [address] [place] [address] [phone]                Allergies  Allergen Reactions   Hydrocortisone Itching   Lisinopril     Other reaction(s): Lethargy (intolerance)   Phenergan [Promethazine Hcl] Other (See Comments)  hallucinations   Statins Other (See Comments)    Severe myalgias   Trazodone  And Nefazodone Cough   Prednisone  Rash    Consultations: None   Procedures/Studies: ECHOCARDIOGRAM COMPLETE Result Date: 12/14/2023    ECHOCARDIOGRAM REPORT   Patient Name:   [name] Date of Exam: 12/14/2023 Medical Rec #:  [id number]       Height:       68.0 in Accession #:    [id number]      Weight:       164.9 lb Date of Birth:  1931-08-29       BSA:          1.883 m Patient Age:    89 years        BP:           155/43 mmHg Patient Gender: F               HR:           62 bpm. Exam Location:  [facility] Procedure: 2D Echo, Cardiac Doppler and Color Doppler (Both Spectral and Color            Flow Doppler were utilized during procedure). Indications:     Chest Pain R07.9  History:         Patient has prior history of Echocardiogram examinations, most                  recent 09/17/2023. Signs/Symptoms:Chest Pain; Risk                  Factors:Hypertension, Diabetes and Dyslipidemia.   Sonographer:     Kip Peon RDCS Referring Phys:  [id number] Deforest Fast Diagnosing Phys: Carson Clara MD IMPRESSIONS  1. Left ventricular ejection fraction, by estimation, is 70 to 75%. The left ventricle has hyperdynamic function. The left ventricle has no regional wall motion abnormalities. There is mild left ventricular hypertrophy. Left ventricular diastolic parameters are indeterminate.  2. Right ventricular systolic function is normal. The right ventricular size is normal. There is normal pulmonary artery systolic pressure. The estimated right ventricular systolic pressure is 23.7 mmHg.  3. The mitral valve is normal in structure. Trivial mitral valve regurgitation.  4. The aortic valve is tricuspid. Aortic valve regurgitation is not visualized. Aortic valve sclerosis is present, with no evidence of aortic valve stenosis.  5. The inferior vena cava is dilated in size with >50% respiratory variability, suggesting right atrial pressure of 8 mmHg. FINDINGS  Left Ventricle: Left ventricular ejection fraction, by estimation, is 70 to 75%. The left ventricle has hyperdynamic function. The left ventricle has no regional wall motion abnormalities. The left ventricular internal cavity size was normal in size. There is mild left ventricular hypertrophy. Left ventricular diastolic parameters are indeterminate. Right Ventricle: The right ventricular size is normal. No increase in right ventricular wall thickness. Right ventricular systolic function is normal. There is normal pulmonary artery systolic pressure. The tricuspid regurgitant velocity is 1.98 m/s, and  with an assumed right atrial pressure of 8 mmHg, the estimated right ventricular systolic pressure is 23.7 mmHg. Left Atrium: Left atrial size was normal in size. Right Atrium: Right atrial size was not well visualized. Pericardium: Trivial pericardial effusion is present. Mitral Valve: The mitral valve is normal in structure. Trivial mitral valve  regurgitation. Tricuspid Valve: The tricuspid valve is normal in structure. Tricuspid valve regurgitation is trivial. Aortic Valve: The aortic valve is tricuspid. Aortic valve regurgitation is not visualized. Aortic valve sclerosis  is present, with no evidence of aortic valve stenosis. Pulmonic Valve: The pulmonic valve was not well visualized. Pulmonic valve regurgitation is not visualized. Aorta: The aortic root and ascending aorta are structurally normal, with no evidence of dilitation. Venous: The inferior vena cava is dilated in size with greater than 50% respiratory variability, suggesting right atrial pressure of 8 mmHg. IAS/Shunts: The interatrial septum was not well visualized.  LEFT VENTRICLE PLAX 2D LVIDd:         4.00 cm   Diastology LVIDs:         2.40 cm   LV e' medial:    7.28 cm/s LV PW:         1.00 cm   LV E/e' medial:  13.4 LV IVS:        1.10 cm   LV e' lateral:   7.20 cm/s LVOT diam:     1.80 cm   LV E/e' lateral: 13.5 LV SV:         69 LV SV Index:   37 LVOT Area:     2.54 cm  RIGHT VENTRICLE             IVC RV S prime:     13.10 cm/s  IVC diam: 2.20 cm TAPSE (M-mode): 2.8 cm LEFT ATRIUM             Index LA diam:        4.00 cm 2.12 cm/m LA Vol (A2C):   51.2 ml 27.19 ml/m LA Vol (A4C):   46.3 ml 24.59 ml/m LA Biplane Vol: 48.9 ml 25.97 ml/m  AORTIC VALVE LVOT Vmax:   103.95 cm/s LVOT Vmean:  71.700 cm/s LVOT VTI:    0.272 m  AORTA Ao Root diam: 2.80 cm Ao Asc diam:  3.10 cm MITRAL VALVE               TRICUSPID VALVE MV Area (PHT): 2.26 cm    TR Peak grad:   15.7 mmHg MV Decel Time: 335 msec    TR Vmax:        198.00 cm/s MR Peak grad: 37.0 mmHg MR Vmax:      304.00 cm/s  SHUNTS MV E velocity: 97.20 cm/s  Systemic VTI:  0.27 m MV A velocity: 98.50 cm/s  Systemic Diam: 1.80 cm MV E/A ratio:  0.99 Carson Clara MD Electronically signed by Carson Clara MD Signature Date/Time: 12/14/2023/10:37:06 AM    Final (Updated)    DG Chest Port 1 View Result Date: 12/13/2023 CLINICAL  DATA:  Shortness of breath, chest pain, and hypertension. EXAM: PORTABLE CHEST 1 VIEW COMPARISON:  09/22/2023. FINDINGS: The heart size and mediastinal contours are within normal limits. There is atherosclerotic calcification of the aorta. Mild atelectasis or scarring is present at the lung bases. No effusion or pneumothorax is seen. No acute osseous abnormality. IMPRESSION: Mild atelectasis or scarring at the lung bases. No acute infiltrate is seen. Electronically Signed   By: Wyvonnia Heimlich M.D.   On: 12/13/2023 17:10     Discharge Exam: Vitals:   12/14/23 0557 12/14/23 0800  BP: (!) 143/43 (!) 155/43  Pulse: (!) 56   Resp: 16   Temp: 97.8 F (36.6 C)   SpO2: 98%    Vitals:   12/13/23 2252 12/14/23 0335 12/14/23 0557 12/14/23 0800  BP: (!) 151/65 (!) 142/45 (!) 143/43 (!) 155/43  Pulse:  61 (!) 56   Resp:  16 16   Temp:  97.8 F (36.6 C) 97.8 F (  36.6 C)   TempSrc:  Oral Oral   SpO2:  96% 98%   Weight:      Height:        General: Pt is alert, awake, not in acute distress Cardiovascular: RRR, S1/S2 +, no rubs, no gallops Respiratory: CTA bilaterally, no wheezing, no rhonchi Abdominal: Soft, NT, ND, bowel sounds + Extremities: no edema, no cyanosis    The results of significant diagnostics from this hospitalization (including imaging, microbiology, ancillary and laboratory) are listed below for reference.     Microbiology: No results found for this or any previous visit (from the past 240 hours).   Labs: BNP (last 3 results) Recent Labs    04/08/23 0247 07/24/23 1456 09/22/23 1848  BNP 83.0 88.0 88.0   Basic Metabolic Panel: Recent Labs  Lab 12/13/23 1646 12/14/23 0227  NA 134* 133*  K 4.2 3.9  CL 99 100  CO2 23 22  GLUCOSE 140* 127*  BUN 20 21  CREATININE 0.84 1.01*  CALCIUM 9.6 9.0   Liver Function Tests: Recent Labs  Lab 12/13/23 1646 12/14/23 0227  AST 18 16  ALT 17 15  ALKPHOS 86 70  BILITOT 0.8 0.7  PROT 6.7 6.1*  ALBUMIN 3.9 3.4*    No results for input(s): LIPASE, AMYLASE in the last 168 hours. No results for input(s): AMMONIA in the last 168 hours. CBC: Recent Labs  Lab 12/13/23 1646 12/14/23 0227  WBC 7.0 8.0  NEUTROABS 4.7  --   HGB 11.4* 11.2*  HCT 33.8* 35.5*  MCV 88.9 91.7  PLT 277 270   Cardiac Enzymes: No results for input(s): CKTOTAL, CKMB, CKMBINDEX, TROPONINI in the last 168 hours. BNP: Invalid input(s): POCBNP CBG: No results for input(s): GLUCAP in the last 168 hours. D-Dimer Recent Labs    12/13/23 1810  DDIMER 0.63*   Hgb A1c No results for input(s): HGBA1C in the last 72 hours. Lipid Profile No results for input(s): CHOL, HDL, LDLCALC, TRIG, CHOLHDL, LDLDIRECT in the last 72 hours. Thyroid  function studies No results for input(s): TSH, T4TOTAL, T3FREE, THYROIDAB in the last 72 hours.  Invalid input(s): FREET3 Anemia work up No results for input(s): VITAMINB12, FOLATE, FERRITIN, TIBC, IRON , RETICCTPCT in the last 72 hours. Urinalysis    Component Value Date/Time   COLORURINE STRAW (A) 09/22/2023 1828   APPEARANCEUR CLEAR 09/22/2023 1828   APPEARANCEUR Clear 05/21/2023 1333   LABSPEC 1.009 09/22/2023 1828   PHURINE 7.0 09/22/2023 1828   GLUCOSEU NEGATIVE 09/22/2023 1828   HGBUR NEGATIVE 09/22/2023 1828   BILIRUBINUR NEGATIVE 09/22/2023 1828   BILIRUBINUR negative 08/25/2023 0913   BILIRUBINUR Negative 05/21/2023 1333   KETONESUR NEGATIVE 09/22/2023 1828   PROTEINUR 100 (A) 09/22/2023 1828   UROBILINOGEN 0.2 08/25/2023 0913   UROBILINOGEN 0.2 12/15/2014 1956   NITRITE NEGATIVE 09/22/2023 1828   LEUKOCYTESUR Large (3+) (A) 10/08/2023 1443   LEUKOCYTESUR NEGATIVE 09/22/2023 1828   Sepsis Labs Recent Labs  Lab 12/13/23 1646 12/14/23 0227  WBC 7.0 8.0   Microbiology No results found for this or any previous visit (from the past 240 hours).   Time coordinating discharge: 35 minutes  SIGNED:   Cornelius Dill,  DO [facility] 12/14/2023, 11:30 AM  If 7PM-7AM, please contact night-coverage [email or web address]

## 2023-12-15 ENCOUNTER — Telehealth: Payer: Self-pay

## 2023-12-15 DIAGNOSIS — M6281 Muscle weakness (generalized): Secondary | ICD-10-CM | POA: Diagnosis not present

## 2023-12-15 NOTE — Transitions of Care (Post Inpatient/ED Visit) (Signed)
 12/15/2023  Name: [name] MRN: [id number] DOB: 10-15-1931  Today's TOC FU Call Status: Today's TOC FU Call Status:: Successful TOC FU Call Completed TOC FU Call Complete Date: 12/15/23 Patient's Name and Date of Birth confirmed.  Transition Care Management Follow-up Telephone Call Date of Discharge: 12/14/23 Discharge Facility: [facility] Type of Discharge: Inpatient Admission Primary Inpatient Discharge Diagnosis:: Chest Pain How have you been since you were released from the hospital?: Better Any questions or concerns?: No (Condition possibly stemming from recent lost of family members)  Items Reviewed: Did you receive and understand the discharge instructions provided?: Yes Medications obtained,verified, and reconciled?: Yes (Medications Reviewed) Any new allergies since your discharge?: No Dietary orders reviewed?: NA Do you have support at home?: Yes People in Home [RPT]: facility resident  Medications Reviewed Today: Medications Reviewed Today     Reviewed by Cathye Coca, LPN (Licensed Practical Nurse) on 12/15/23 at [phone]  Med List Status: <None>   Medication Order Taking? Sig Documenting Provider Last Dose Status Informant  acetaminophen  (TYLENOL ) 325 MG tablet [id number] Yes TAKE (2) TABLETS BY MOUTH EVERY SIX HOURS AS NEEDED FOR PAIN. MAX 3GM IN 24 HOURS.  Patient taking differently: Take 650 mg by mouth every 6 (six) hours as needed for mild pain (pain score 1-3).   Bennet Brasil, MD  Active Nursing Home Medication Administration Guide (MAG)  albuterol  (VENTOLIN  HFA) 108 [phone] Base) MCG/ACT inhaler [id number] Yes INHALE 2 PUFFS INTO THE LUNGS EVERY SIX HOURS AS NEEDED FOR WHEEZING OR SHORTNESS OF BREATH. Bennet Brasil, MD  Active Nursing Home Medication Administration Guide (MAG)  ALPRAZolam  (XANAX ) 0.25 MG tablet [id number] Yes Take 1 tablet (0.25 mg total) by mouth at bedtime. Mason Sole, Pratik D, DO  Active   amLODipine  (NORVASC ) 10 MG tablet [id number]  Yes TAKE (1) TABLET BY MOUTH ONCE DAILY.  Patient taking differently: Take 10 mg by mouth daily.   Bennet Brasil, MD  Active Nursing Home Medication Administration Guide (MAG)  aspirin  (ASPIRIN  LOW DOSE) 81 MG chewable tablet [id number] Yes CHEW (1) TABLET BY MOUTH ONCE DAILY.  Patient taking differently: Chew 81 mg by mouth daily.   Bennet Brasil, MD  Active Nursing Home Medication Administration Guide (MAG)  beta carotene (BETA CAROTENE PROVITAMIN A) 25000 UNIT capsule [id number] Yes TAKE (1) CAPSULE BY MOUTH AT BEDTIME.  Patient taking differently: Take 25,000 Units by mouth daily.   Bennet Brasil, MD  Active Nursing Home Medication Administration Guide (MAG)  BISACODYL  5 MG EC tablet [id number] Yes TAKE 1 TABLET BY MOUTH ONCE DAILY AS NEEDED FOR MODERATE CONSTIPATION.  Patient taking differently: Take 5 mg by mouth daily as needed for moderate constipation.   Bennet Brasil, MD  Active Nursing Home Medication Administration Guide (MAG)  cyanocobalamin  1000 MCG tablet [id number] Yes Take 1,000 mcg by mouth at bedtime. [provider]  Active Nursing Home Medication Administration Guide (MAG)  cycloSPORINE  (RESTASIS ) 0.05 % ophthalmic emulsion [id number] Yes Place 1 drop into both eyes 2 (two) times daily. [provider]  Active Nursing Home Medication Administration Guide (MAG)  dicyclomine  (BENTYL ) 10 MG capsule [id number] Yes TAKE 1 CAPSULE BY MOUTH TWICE DAILY AS NEEDED FOR ABDOMINAL CRAMPS AND LOOSE STOOL.  Patient taking differently: Take 10 mg by mouth 2 (two) times daily as needed (abdominal cramps and loose stool). TAKE 1 CAPSULE BY MOUTH TWICE DAILY AS NEEDED FOR ABDOMINAL CRAMPS AND LOOSE STOOL.   Bennet Brasil, MD  Active Nursing Home  Medication Administration Guide (MAG)  ferrous sulfate  (FEROSUL) 325 (65 FE) MG tablet [id number] Yes TAKE (1) TABLET BY MOUTH EVERY OTHER DAY.  Patient taking differently: Take 325 mg by mouth every other day.   Bennet Brasil, MD   Active Nursing Home Medication Administration Guide (MAG)  glucose blood (EASYMAX TEST) test strip [id number] Yes CHECK BLOOD SUGAR ONCE DAILY.(CALL MD IF BS BELOW 60: OR IF BS ABOVE 400) Luking, Jackelyn Marvel, MD  Active Nursing Home Medication Administration Guide (MAG)  hydrALAZINE  (APRESOLINE ) 50 MG tablet [id number] Yes TAKE (1) TABLET BY MOUTH EVERY EIGHT HOURS.  Patient taking differently: Take 50 mg by mouth every 8 (eight) hours.   Bennet Brasil, MD  Active Nursing Home Medication Administration Guide (MAG)  IVIZIA DRY EYES 0.5 % SOLN [id number] Yes Place 1 drop into both eyes 2 (two) times daily. [provider]  Active Nursing Home Medication Administration Guide (MAG)  loratadine  (ALLERGY RELIEF) 10 MG tablet [id number] Yes TAKE (1) TABLET BY MOUTH ONCE DAILY.  Patient taking differently: Take 10 mg by mouth daily.   Bennet Brasil, MD  Active Nursing Home Medication Administration Guide (MAG)  Melatonin 10 MG TABS [id number] Yes Take 10 mg by mouth at bedtime. Cook, Jayce G, DO  Active Nursing Home Medication Administration Guide (MAG)  metFORMIN  (GLUCOPHAGE ) 500 MG tablet [id number] Yes TAKE (1) TABLET BY MOUTH IN THE MORNING & (1/2) TABLET (250MG ) BY MOUTH AT SUPPER  Patient taking differently: Take 500 mg by mouth 2 (two) times daily with a meal. TAKE (1) TABLET BY MOUTH IN THE MORNING & (1/2) TABLET (250MG ) BY MOUTH AT SUPPER   Bennet Brasil, MD  Active Nursing Home Medication Administration Guide (MAG)  Multiple Vitamins-Minerals (PRESERVISION AREDS 2) CAPS [id number] Yes Take 1 capsule by mouth in the morning and at bedtime. [provider]  Active Nursing Home Medication Administration Guide (MAG)  ondansetron  (ZOFRAN -ODT) 4 MG disintegrating tablet [id number] Yes 4mg  ODT q4 hours prn nausea/vomit  Patient taking differently: Take 4 mg by mouth every 4 (four) hours as needed for nausea or vomiting.   Zammit, Joseph, MD  Active Nursing Home Medication Administration Guide  (MAG)  pantoprazole  (PROTONIX ) 40 MG tablet [id number] Yes TAKE (1) TABLET BY MOUTH DAILY.  Patient taking differently: Take 40 mg by mouth daily.   Bennet Brasil, MD  Active Nursing Home Medication Administration Guide (MAG)  polyethylene glycol powder (GOODSENSE CLEARLAX) 17 GM/SCOOP powder [id number] Yes 1 scoop in 8 ounces water  once daily as needed for constipation  Patient taking differently: Take 17 g by mouth daily as needed for mild constipation. 1 scoop in 8 ounces water  once daily as needed for constipation   Bennet Brasil, MD  Active Nursing Home Medication Administration Guide (MAG)  sertraline  (ZOLOFT ) 25 MG tablet [id number] Yes TAKE (1) TABLET BY MOUTH ONCE DAILY.  Patient taking differently: Take 25 mg by mouth daily.   Bennet Brasil, MD  Active Nursing Home Medication Administration Guide (MAG)  sodium chloride  1 g tablet [id number] Yes Take 1 tablet (1 g total) by mouth daily. Bennet Brasil, MD  Active Nursing Home Medication Administration Guide (MAG)  valsartan  (DIOVAN ) 160 MG tablet [id number] Yes Take 1 tablet (160 mg total) by mouth daily. Bennet Brasil, MD  Active Nursing Home Medication Administration Guide (MAG)  Vitamin D , Cholecalciferol , 10 MCG (400 UNIT) TABS [id number] Yes TAKE (1) TABLET BY MOUTH ONCE DAILY.  Patient taking differently: Take 1 tablet by mouth  daily.   Bennet Brasil, MD  Active Nursing Home Medication Administration Guide [facility])  Med List Note Lawana Pray 03/26/22 1610): [facility] [phone]            Home Care and Equipment/Supplies: Were Home Health Services Ordered?: NA Any new equipment or medical supplies ordered?: NA  Functional Questionnaire: Do you need assistance with bathing/showering or dressing?: Yes Do you need assistance with meal preparation?: Yes Do you need assistance with eating?: No Do you have difficulty maintaining continence: Yes Do you need assistance with getting out of  bed/getting out of a chair/moving?: Yes Do you have difficulty managing or taking your medications?: Yes  Follow up appointments reviewed: PCP Follow-up appointment confirmed?: Yes Date of PCP follow-up appointment?: 12/25/23 Follow-up Provider: Dr. Geralyn Knee [facility] Follow-up appointment confirmed?: NA Do you need transportation to your follow-up appointment?: No Do you understand care options if your condition(s) worsen?: Yes-patient verbalized understanding    SIGNATURE Seabron Cypress, LPN [facility] Health Advisor  l [facility] You Are. We Are. [facility] Direct Dial [phone]

## 2023-12-16 DIAGNOSIS — M6281 Muscle weakness (generalized): Secondary | ICD-10-CM | POA: Diagnosis not present

## 2023-12-17 NOTE — ED Provider Notes (Signed)
 [facility] MEDICAL SURGICAL UNIT Provider Note   CSN: [id number] Arrival date & time: 12/13/23  1621     Patient presents with: No chief complaint on file.   [name] is a 89 y.o. female.   Patient presents with weakness and chest pain.  She has a history of diabetes  The history is provided by the patient. No language interpreter was used.  Chest Pain Pain location:  L chest Pain quality: aching   Pain radiates to:  Does not radiate Pain severity:  Mild Onset quality:  Sudden Progression:  Worsening Chronicity:  New Context: not breathing   Relieved by:  Nothing Worsened by:  Nothing Ineffective treatments:  None tried Associated symptoms: no abdominal pain, no back pain, no cough, no fatigue and no headache        Prior to Admission medications   Medication Sig Start Date End Date Taking? Authorizing Provider  acetaminophen  (TYLENOL ) 325 MG tablet TAKE (2) TABLETS BY MOUTH EVERY SIX HOURS AS NEEDED FOR PAIN. MAX 3GM IN 24 HOURS. Patient taking differently: Take 650 mg by mouth every 6 (six) hours as needed for mild pain (pain score 1-3). 07/28/23  Yes Luking, Jackelyn Marvel, MD  albuterol  (VENTOLIN  HFA) 108 (90 Base) MCG/ACT inhaler INHALE 2 PUFFS INTO THE LUNGS EVERY SIX HOURS AS NEEDED FOR WHEEZING OR SHORTNESS OF BREATH. 09/26/23  Yes Luking, Jackelyn Marvel, MD  amLODipine  (NORVASC ) 10 MG tablet TAKE (1) TABLET BY MOUTH ONCE DAILY. Patient taking differently: Take 10 mg by mouth daily. 10/07/23  Yes Luking, Jackelyn Marvel, MD  aspirin  (ASPIRIN  LOW DOSE) 81 MG chewable tablet CHEW (1) TABLET BY MOUTH ONCE DAILY. Patient taking differently: Chew 81 mg by mouth daily. 09/06/23  Yes Luking, Jackelyn Marvel, MD  beta carotene (BETA CAROTENE PROVITAMIN A) 25000 UNIT capsule TAKE (1) CAPSULE BY MOUTH AT BEDTIME. Patient taking differently: Take 25,000 Units by mouth daily. 10/07/23  Yes Luking, Jackelyn Marvel, MD  BISACODYL  5 MG EC tablet TAKE 1 TABLET BY MOUTH ONCE DAILY AS NEEDED FOR MODERATE  CONSTIPATION. Patient taking differently: Take 5 mg by mouth daily as needed for moderate constipation. 12/20/22  Yes Bennet Brasil, MD  cyanocobalamin  1000 MCG tablet Take 1,000 mcg by mouth at bedtime.   Yes [provider]  cycloSPORINE  (RESTASIS ) 0.05 % ophthalmic emulsion Place 1 drop into both eyes 2 (two) times daily.   Yes [provider]  dicyclomine  (BENTYL ) 10 MG capsule TAKE 1 CAPSULE BY MOUTH TWICE DAILY AS NEEDED FOR ABDOMINAL CRAMPS AND LOOSE STOOL. Patient taking differently: Take 10 mg by mouth 2 (two) times daily as needed (abdominal cramps and loose stool). TAKE 1 CAPSULE BY MOUTH TWICE DAILY AS NEEDED FOR ABDOMINAL CRAMPS AND LOOSE STOOL. 11/21/22  Yes Luking, Jackelyn Marvel, MD  ferrous sulfate  (FEROSUL) 325 (65 FE) MG tablet TAKE (1) TABLET BY MOUTH EVERY OTHER DAY. Patient taking differently: Take 325 mg by mouth every other day. 10/07/23  Yes Luking, Jackelyn Marvel, MD  hydrALAZINE  (APRESOLINE ) 50 MG tablet TAKE (1) TABLET BY MOUTH EVERY EIGHT HOURS. Patient taking differently: Take 50 mg by mouth every 8 (eight) hours. 11/03/23  Yes Luking, Jackelyn Marvel, MD  IVIZIA DRY EYES 0.5 % SOLN Place 1 drop into both eyes 2 (two) times daily. 09/22/23  Yes [provider]  loratadine  (ALLERGY RELIEF) 10 MG tablet TAKE (1) TABLET BY MOUTH ONCE DAILY. Patient taking differently: Take 10 mg by mouth daily. 10/07/23  Yes Bennet Brasil, MD  Melatonin 10 MG TABS Take 10 mg by mouth at bedtime. 09/25/23  Yes Cook, Jayce G, DO  metFORMIN  (GLUCOPHAGE ) 500 MG tablet TAKE (1) TABLET BY MOUTH IN THE MORNING & (1/2) TABLET (250MG ) BY MOUTH AT SUPPER Patient taking differently: Take 500 mg by mouth 2 (two) times daily with a meal. TAKE (1) TABLET BY MOUTH IN THE MORNING & (1/2) TABLET (250MG ) BY MOUTH AT SUPPER 10/07/23  Yes Luking, Jackelyn Marvel, MD  Multiple Vitamins-Minerals (PRESERVISION AREDS 2) CAPS Take 1 capsule by mouth in the morning and at bedtime.   Yes [provider]  ondansetron   (ZOFRAN -ODT) 4 MG disintegrating tablet 4mg  ODT q4 hours prn nausea/vomit Patient taking differently: Take 4 mg by mouth every 4 (four) hours as needed for nausea or vomiting. 09/05/23  Yes Elyon Zoll, MD  pantoprazole  (PROTONIX ) 40 MG tablet TAKE (1) TABLET BY MOUTH DAILY. Patient taking differently: Take 40 mg by mouth daily. 10/07/23  Yes Luking, Jackelyn Marvel, MD  polyethylene glycol powder (GOODSENSE CLEARLAX) 17 GM/SCOOP powder 1 scoop in 8 ounces water  once daily as needed for constipation Patient taking differently: Take 17 g by mouth daily as needed for mild constipation. 1 scoop in 8 ounces water  once daily as needed for constipation 06/03/23  Yes Luking, Scott A, MD  sertraline  (ZOLOFT ) 25 MG tablet TAKE (1) TABLET BY MOUTH ONCE DAILY. Patient taking differently: Take 25 mg by mouth daily. 10/07/23  Yes Bennet Brasil, MD  sodium chloride  1 g tablet Take 1 tablet (1 g total) by mouth daily. 10/07/23  Yes Luking, Jackelyn Marvel, MD  valsartan  (DIOVAN ) 160 MG tablet Take 1 tablet (160 mg total) by mouth daily. 08/25/23  Yes Luking, Jackelyn Marvel, MD  Vitamin D , Cholecalciferol , 10 MCG (400 UNIT) TABS TAKE (1) TABLET BY MOUTH ONCE DAILY. Patient taking differently: Take 1 tablet by mouth daily. 10/07/23  Yes Bennet Brasil, MD  ALPRAZolam  (XANAX ) 0.25 MG tablet Take 1 tablet (0.25 mg total) by mouth at bedtime. 12/14/23   Mason Sole, Pratik D, DO  glucose blood (EASYMAX TEST) test strip CHECK BLOOD SUGAR ONCE DAILY.(CALL MD IF BS BELOW 60: OR IF BS ABOVE 400) 10/07/23   Bennet Brasil, MD    Allergies: Hydrocortisone, Lisinopril, Phenergan [promethazine hcl], Statins, Trazodone  and nefazodone, and Prednisone     Review of Systems  Constitutional:  Negative for appetite change and fatigue.  HENT:  Negative for congestion, ear discharge and sinus pressure.   Eyes:  Negative for discharge.  Respiratory:  Negative for cough.   Cardiovascular:  Positive for chest pain.  Gastrointestinal:  Negative for abdominal pain and  diarrhea.  Genitourinary:  Negative for frequency and hematuria.  Musculoskeletal:  Negative for back pain.  Skin:  Negative for rash.  Neurological:  Negative for seizures and headaches.  Psychiatric/Behavioral:  Negative for hallucinations.     Updated Vital Signs BP (!) 155/43   Pulse (!) 56   Temp 97.8 F (36.6 C) (Oral)   Resp 16   Ht 5' 8 (1.727 m)   Wt 74.8 kg   SpO2 98%   BMI 25.07 kg/m   Physical Exam Vitals and nursing note reviewed.  Constitutional:      Appearance: She is well-developed.  HENT:     Head: Normocephalic.     Nose: Nose normal.   Eyes:     General: No scleral icterus.    Conjunctiva/sclera: Conjunctivae normal.   Neck:     Thyroid : No thyromegaly.   Cardiovascular:  Rate and Rhythm: Normal rate and regular rhythm.     Heart sounds: No murmur heard.    No friction rub. No gallop.  Pulmonary:     Breath sounds: No stridor. No wheezing or rales.  Chest:     Chest wall: No tenderness.  Abdominal:     General: There is no distension.     Tenderness: There is no abdominal tenderness. There is no rebound.   Musculoskeletal:        General: Normal range of motion.     Cervical back: Neck supple.  Lymphadenopathy:     Cervical: No cervical adenopathy.   Skin:    Findings: No erythema or rash.   Neurological:     Mental Status: She is alert and oriented to person, place, and time.     Motor: No abnormal muscle tone.     Coordination: Coordination normal.   Psychiatric:        Behavior: Behavior normal.     (all labs ordered are listed, but only abnormal results are displayed) Labs Reviewed  CBC WITH DIFFERENTIAL/PLATELET - Abnormal; Notable for the following components:      Result Value   RBC 3.80 (*)    Hemoglobin 11.4 (*)    HCT 33.8 (*)    All other components within normal limits  COMPREHENSIVE METABOLIC PANEL WITH GFR - Abnormal; Notable for the following components:   Sodium 134 (*)    Glucose, Bld 140 (*)    All  other components within normal limits  D-DIMER, QUANTITATIVE - Abnormal; Notable for the following components:   D-Dimer, Quant 0.63 (*)    All other components within normal limits  CBC - Abnormal; Notable for the following components:   Hemoglobin 11.2 (*)    HCT 35.5 (*)    All other components within normal limits  COMPREHENSIVE METABOLIC PANEL WITH GFR - Abnormal; Notable for the following components:   Sodium 133 (*)    Glucose, Bld 127 (*)    Creatinine, Ser 1.01 (*)    Total Protein 6.1 (*)    Albumin 3.4 (*)    GFR, Estimated 53 (*)    All other components within normal limits  TROPONIN I (HIGH SENSITIVITY)  TROPONIN I (HIGH SENSITIVITY)  TROPONIN I (HIGH SENSITIVITY)    EKG: EKG Interpretation Date/Time:  Saturday December 13 2023 16:43:37 EDT Ventricular Rate:  78 PR Interval:  212 QRS Duration:  86 QT Interval:  388 QTC Calculation: 442 R Axis:   78  Text Interpretation: Unknown rhythm, irregular rate Borderline prolonged PR interval Confirmed by Cheyenne Cotta [phone]) on 12/13/2023 9:05:41 PM  Radiology: No results found.   Procedures   Medications Ordered in the ED  alum & mag hydroxide-simeth (MAALOX/MYLANTA) 200-200-20 MG/5ML suspension 30 mL (30 mLs Oral Given 12/13/23 1732)  pantoprazole  (PROTONIX ) injection 40 mg (40 mg Intravenous Given 12/13/23 1733)  morphine  (PF) 2 MG/ML injection 2 mg (2 mg Intravenous Given 12/13/23 1733)  lidocaine  (XYLOCAINE ) 2 % viscous mouth solution 15 mL (15 mLs Mouth/Throat Given 12/13/23 1732)  hydrALAZINE  (APRESOLINE ) injection 5 mg (5 mg Intravenous Given by Other 12/13/23 1816)  labetalol  (NORMODYNE ) injection 20 mg (20 mg Intravenous Given 12/13/23 2142)  CRITICAL CARE Performed by: Cheyenne Cotta Total critical care time: 45 minutes Critical care time was exclusive of separately billable procedures and treating other patients. Critical care was necessary to treat or prevent imminent or life-threatening  deterioration. Critical care was time spent personally by me on the following  activities: development of treatment plan with patient and/or surrogate as well as nursing, discussions with consultants, evaluation of patient's response to treatment, examination of patient, obtaining history from patient or surrogate, ordering and performing treatments and interventions, ordering and review of laboratory studies, ordering and review of radiographic studies, pulse oximetry and re-evaluation of patient's condition.    Patient complains of chest pain she also has significant hypertension that is uncontrolled                               Medical Decision Making Amount and/or Complexity of Data Reviewed Labs: ordered. Radiology: ordered.  Risk OTC drugs. Prescription drug management. Decision regarding hospitalization.   Chest pain that is most likely GERD related and poorly controlled blood pressure.  Patient will be admitted to medicine     Final diagnoses:  None    ED Discharge Orders          Ordered    ALPRAZolam  (XANAX ) 0.25 MG tablet  Daily at bedtime        12/14/23 1123    Increase activity slowly        12/14/23 1123    Diet - low sodium heart healthy        12/14/23 1123               Cheyenne Cotta, MD 12/17/23 1127

## 2023-12-19 ENCOUNTER — Emergency Department (HOSPITAL_COMMUNITY)

## 2023-12-19 ENCOUNTER — Other Ambulatory Visit: Payer: Self-pay

## 2023-12-19 ENCOUNTER — Emergency Department (HOSPITAL_COMMUNITY)
Admission: EM | Admit: 2023-12-19 | Discharge: 2023-12-19 | Disposition: A | Discharge status: Home / Self Care | Attending: Emergency Medicine | Admitting: Emergency Medicine

## 2023-12-19 DIAGNOSIS — Z7984 Long term (current) use of oral hypoglycemic drugs: Secondary | ICD-10-CM | POA: Diagnosis not present

## 2023-12-19 DIAGNOSIS — I10 Essential (primary) hypertension: Secondary | ICD-10-CM | POA: Diagnosis not present

## 2023-12-19 DIAGNOSIS — R030 Elevated blood-pressure reading, without diagnosis of hypertension: Secondary | ICD-10-CM | POA: Diagnosis present

## 2023-12-19 DIAGNOSIS — I700 Atherosclerosis of aorta: Secondary | ICD-10-CM | POA: Diagnosis not present

## 2023-12-19 DIAGNOSIS — R69 Illness, unspecified: Secondary | ICD-10-CM | POA: Diagnosis not present

## 2023-12-19 DIAGNOSIS — R079 Chest pain, unspecified: Secondary | ICD-10-CM | POA: Diagnosis not present

## 2023-12-19 DIAGNOSIS — E119 Type 2 diabetes mellitus without complications: Secondary | ICD-10-CM | POA: Diagnosis not present

## 2023-12-19 DIAGNOSIS — Z7982 Long term (current) use of aspirin: Secondary | ICD-10-CM | POA: Insufficient documentation

## 2023-12-19 LAB — CBC WITH DIFFERENTIAL/PLATELET
Abs Immature Granulocytes: 0.02 10*3/uL (ref 0.00–0.07)
Basophils Absolute: 0 10*3/uL (ref 0.0–0.1)
Basophils Relative: 0 %
Eosinophils Absolute: 0.1 10*3/uL (ref 0.0–0.5)
Eosinophils Relative: 1 %
HCT: 36 % (ref 36.0–46.0)
Hemoglobin: 12.2 g/dL (ref 12.0–15.0)
Immature Granulocytes: 0 %
Lymphocytes Relative: 27 %
Lymphs Abs: 2 10*3/uL (ref 0.7–4.0)
MCH: 30 pg (ref 26.0–34.0)
MCHC: 33.9 g/dL (ref 30.0–36.0)
MCV: 88.7 fL (ref 80.0–100.0)
Monocytes Absolute: 0.7 10*3/uL (ref 0.1–1.0)
Monocytes Relative: 10 %
Neutro Abs: 4.6 10*3/uL (ref 1.7–7.7)
Neutrophils Relative %: 62 %
Platelets: 335 10*3/uL (ref 150–400)
RBC: 4.06 MIL/uL (ref 3.87–5.11)
RDW: 13.2 % (ref 11.5–15.5)
WBC: 7.5 10*3/uL (ref 4.0–10.5)
nRBC: 0 % (ref 0.0–0.2)

## 2023-12-19 LAB — COMPREHENSIVE METABOLIC PANEL WITH GFR
ALT: 17 U/L (ref 0–44)
AST: 19 U/L (ref 15–41)
Albumin: 3.9 g/dL (ref 3.5–5.0)
Alkaline Phosphatase: 94 U/L (ref 38–126)
Anion gap: 13 (ref 5–15)
BUN: 17 mg/dL (ref 8–23)
CO2: 24 mmol/L (ref 22–32)
Calcium: 9.6 mg/dL (ref 8.9–10.3)
Chloride: 96 mmol/L — ABNORMAL LOW (ref 98–111)
Creatinine, Ser: 0.83 mg/dL (ref 0.44–1.00)
GFR, Estimated: >60 mL/min (ref >60)
Glucose, Bld: 120 mg/dL — ABNORMAL HIGH (ref 70–99)
Potassium: 3.8 mmol/L (ref 3.5–5.1)
Sodium: 133 mmol/L — ABNORMAL LOW (ref 135–145)
Total Bilirubin: 1.1 mg/dL (ref 0.0–1.2)
Total Protein: 6.9 g/dL (ref 6.5–8.1)

## 2023-12-19 LAB — TROPONIN I (HIGH SENSITIVITY)
Troponin I (High Sensitivity): 8 ng/L (ref <18)
Troponin I (High Sensitivity): 8 ng/L (ref <18)

## 2023-12-19 LAB — D-DIMER, QUANTITATIVE: D-Dimer, Quant: 0.55 ug{FEU}/mL — ABNORMAL HIGH (ref 0.00–0.50)

## 2023-12-19 MED ORDER — LORAZEPAM 2 MG/ML IJ SOLN
0.5000 mg | Freq: Once | INTRAMUSCULAR | Status: AC
  Administered 2023-12-19: 0.5 mg via INTRAVENOUS
  Filled 2023-12-19: qty 1

## 2023-12-19 MED ORDER — HYDRALAZINE HCL 25 MG PO TABS
50.0000 mg | ORAL_TABLET | Freq: Once | ORAL | Status: AC
  Administered 2023-12-19: 50 mg via ORAL
  Filled 2023-12-19: qty 2

## 2023-12-19 NOTE — ED Triage Notes (Signed)
 Pt from [facility] via EMS for hypertension, pt BP upon arrival 191/76, pt reports being seen for same last week with no home med adjustment, appt with PCP 12/24/23

## 2023-12-19 NOTE — ED Notes (Signed)
 Pt being discharged back to [facility]. Contacted [facility] and spoke with Br[facility] ([employer]) and notified of pt's discharge as well as inquired if they had anyone to pick up pt from hospital and bring back to facility. Was informed that they did not so [facility] [employer] called to arrange transport back to [facility]. Stated they would pick up pt in approximately 30-40 minutes. Pt given something to eat ([place] sandwich) at her request while she waits.

## 2023-12-19 NOTE — ED Provider Notes (Signed)
 [place] [facility]PARTMENT AT [facility] Provider Note   CSN: [id number] Arrival date & time: 12/19/23  1647     Patient presents with: Hypertension   [name] is a 89 y.o. female.  {Add pertinent medical, surgical, social history, OB history to YEP:67052} Patient complains of elevated blood pressure.  Patient has a history of hypertension diabetes and mesenteric stent.  She was recently in the hospital for chest pain.  Patient complains of hypertension but is not having any chest pain now   Hypertension       Prior to Admission medications   Medication Sig Start Date End Date Taking? Authorizing Provider  acetaminophen  (TYLENOL ) 325 MG tablet TAKE (2) TABLETS BY MOUTH EVERY SIX HOURS AS NEEDED FOR PAIN. MAX 3GM IN 24 HOURS. Patient taking differently: Take 650 mg by mouth every 6 (six) hours as needed for mild pain (pain score 1-3). 07/28/23  Yes Luking, Glendia LABOR, MD  albuterol  (VENTOLIN  HFA) 108 (90 Base) MCG/ACT inhaler INHALE 2 PUFFS INTO THE LUNGS EVERY SIX HOURS AS NEEDED FOR WHEEZING OR SHORTNESS OF BREATH. 09/26/23  Yes Luking, Glendia LABOR, MD  ALPRAZolam  (XANAX ) 0.25 MG tablet Take 1 tablet (0.25 mg total) by mouth at bedtime. 12/14/23  Yes Shah, Pratik D, DO  amLODipine  (NORVASC ) 10 MG tablet TAKE (1) TABLET BY MOUTH ONCE DAILY. Patient taking differently: Take 10 mg by mouth daily. 10/07/23  Yes Luking, Glendia LABOR, MD  aspirin  (ASPIRIN  LOW DOSE) 81 MG chewable tablet CHEW (1) TABLET BY MOUTH ONCE DAILY. Patient taking differently: Chew 81 mg by mouth daily. 09/06/23  Yes Luking, Glendia LABOR, MD  beta carotene (BETA CAROTENE PROVITAMIN A) 25000 UNIT capsule TAKE (1) CAPSULE BY MOUTH AT BEDTIME. Patient taking differently: Take 25,000 Units by mouth at bedtime. 10/07/23  Yes Luking, Glendia LABOR, MD  BISACODYL  5 MG EC tablet TAKE 1 TABLET BY MOUTH ONCE DAILY AS NEEDED FOR MODERATE CONSTIPATION. Patient taking differently: Take 5 mg by mouth daily as needed for moderate  constipation. 12/20/22  Yes [name], MD  cyanocobalamin  1000 MCG tablet Take 1,000 mcg by mouth at bedtime.   Yes [provider]  cycloSPORINE  (RESTASIS ) 0.05 % ophthalmic emulsion Place 1 drop into both eyes 2 (two) times daily.   Yes [provider]  dicyclomine  (BENTYL ) 10 MG capsule TAKE 1 CAPSULE BY MOUTH TWICE DAILY AS NEEDED FOR ABDOMINAL CRAMPS AND LOOSE STOOL. Patient taking differently: Take 10 mg by mouth 2 (two) times daily as needed (abdominal cramps and loose stool). TAKE 1 CAPSULE BY MOUTH TWICE DAILY AS NEEDED FOR ABDOMINAL CRAMPS AND LOOSE STOOL. 11/21/22  Yes Luking, Glendia LABOR, MD  ferrous sulfate  (FEROSUL) 325 (65 FE) MG tablet TAKE (1) TABLET BY MOUTH EVERY OTHER DAY. Patient taking differently: Take 325 mg by mouth every other day. 10/07/23  Yes Luking, Glendia LABOR, MD  hydrALAZINE  (APRESOLINE ) 50 MG tablet TAKE (1) TABLET BY MOUTH EVERY EIGHT HOURS. Patient taking differently: Take 50 mg by mouth every 8 (eight) hours. 11/03/23  Yes Luking, Glendia LABOR, MD  IVIZIA DRY EYES 0.5 % SOLN Place 1 drop into both eyes 2 (two) times daily. 09/22/23  Yes [provider]  loratadine  (ALLERGY RELIEF) 10 MG tablet TAKE (1) TABLET BY MOUTH ONCE DAILY. Patient taking differently: Take 10 mg by mouth daily. 10/07/23  Yes Luking, Glendia LABOR, MD  Melatonin 10 MG TABS Take 10 mg by mouth at bedtime. 09/25/23  Yes Cook, Jayce G, DO  metFORMIN  (  GLUCOPHAGE ) 500 MG tablet TAKE (1) TABLET BY MOUTH IN THE MORNING & (1/2) TABLET (250MG ) BY MOUTH AT SUPPER Patient taking differently: Take 500 mg by mouth 2 (two) times daily with a meal. TAKE (1) TABLET BY MOUTH IN THE MORNING & (1/2) TABLET (250MG ) BY MOUTH AT SUPPER 10/07/23  Yes Luking, Glendia LABOR, MD  Multiple Vitamins-Minerals (PRESERVISION AREDS 2) CAPS Take 1 capsule by mouth in the morning and at bedtime.   Yes [provider]  ondansetron  (ZOFRAN -ODT) 4 MG disintegrating tablet 4mg  ODT q4 hours prn nausea/vomit Patient taking  differently: Take 4 mg by mouth every 4 (four) hours as needed for nausea or vomiting. 09/05/23  Yes Suzette Pac, MD  pantoprazole  (PROTONIX ) 40 MG tablet TAKE (1) TABLET BY MOUTH DAILY. Patient taking differently: Take 40 mg by mouth daily. 10/07/23  Yes Luking, Glendia LABOR, MD  polyethylene glycol powder (GOODSENSE CLEARLAX) 17 GM/SCOOP powder 1 scoop in 8 ounces water  once daily as needed for constipation Patient taking differently: Take 17 g by mouth daily as needed for mild constipation. 1 scoop in 8 ounces water  once daily as needed for constipation 06/03/23  Yes Luking, Glendia LABOR, MD  sertraline  (ZOLOFT ) 25 MG tablet TAKE (1) TABLET BY MOUTH ONCE DAILY. Patient taking differently: Take 25 mg by mouth daily. 10/07/23  Yes [name], MD  sodium chloride  1 g tablet Take 1 tablet (1 g total) by mouth daily. 10/07/23  Yes [name], MD  valsartan  (DIOVAN ) 160 MG tablet Take 1 tablet (160 mg total) by mouth daily. 08/25/23  Yes Luking, Glendia LABOR, MD  Vitamin D , Cholecalciferol , 10 MCG (400 UNIT) TABS TAKE (1) TABLET BY MOUTH ONCE DAILY. Patient taking differently: Take 1 tablet by mouth daily. 10/07/23  Yes Luking, Scott A, MD  glucose blood (EASYMAX TEST) test strip CHECK BLOOD SUGAR ONCE DAILY.(CALL MD IF BS BELOW 60: OR IF BS ABOVE 400) 10/07/23   [name], MD    Allergies: Statins, Phenergan [promethazine hcl], Hydrocortisone, Lisinopril, Prednisone , and Trazodone  and nefazodone    Review of Systems  Updated Vital Signs BP (!) 147/55   Pulse 73   Temp 97.9 F (36.6 C) (Oral)   Resp 18   Ht 5' 8 (1.727 m)   Wt 74.4 kg   SpO2 94%   BMI 24.94 kg/m   Physical Exam  (all labs ordered are listed, but only abnormal results are displayed) Labs Reviewed  COMPREHENSIVE METABOLIC PANEL WITH GFR - Abnormal; Notable for the following components:      Result Value   Sodium 133 (*)    Chloride 96 (*)    Glucose, Bld 120 (*)    All other components within normal limits  D-DIMER,  QUANTITATIVE - Abnormal; Notable for the following components:   D-Dimer, Quant 0.55 (*)    All other components within normal limits  CBC WITH DIFFERENTIAL/PLATELET  TROPONIN I (HIGH SENSITIVITY)  TROPONIN I (HIGH SENSITIVITY)    EKG: None  Radiology: [facility] Chest Port 1 View Result Date: 12/19/2023 CLINICAL DATA:  pain EXAM: PORTABLE CHEST - 1 VIEW COMPARISON:  The 12/13/2023. FINDINGS: Cardiac silhouette enlarged. No evidence of pneumothorax or pleural effusion. No evidence of pulmonary edema. Aorta is calcified. No osseous abnormalities identified. IMPRESSION: Enlarged cardiac silhouette. Electronically Signed   By: Fonda Field M.D.   On: 12/19/2023 19:38    {Document cardiac monitor, telemetry assessment procedure when appropriate:32947} Procedures   Medications Ordered in the ED  hydrALAZINE  (APRESOLINE ) tablet 50  mg (has no administration in time range)  LORazepam  (ATIVAN ) injection 0.5 mg (0.5 mg Intravenous Given 12/19/23 1803)      {Click here for ABCD2, HEART and other calculators REFRESH Note before signing:1}                              Medical Decision Making Amount and/or Complexity of Data Reviewed Labs: ordered. Radiology: ordered. ECG/medicine tests: ordered.  Risk Prescription drug management.   Patient with poorly controlled blood pressure.  She will be increasing her Diovan  so she is taking 320 mg a day instead of 160  {Document critical care time when appropriate  Document review of labs and clinical decision tools ie CHADS2VASC2, etc  Document your independent review of radiology images and any outside records  Document your discussion with family members, caretakers and with consultants  Document social determinants of health affecting pt's care  Document your decision making why or why not admission, treatments were needed:32947:::1}   Final diagnoses:  Primary hypertension    ED Discharge Orders     None

## 2023-12-19 NOTE — Discharge Instructions (Addendum)
 Increase your Diovan  to 320 mg a day.   Follow up with your md next week

## 2023-12-22 ENCOUNTER — Other Ambulatory Visit: Payer: Self-pay | Admitting: Family Medicine

## 2023-12-22 ENCOUNTER — Ambulatory Visit: Payer: Self-pay

## 2023-12-22 DIAGNOSIS — M6281 Muscle weakness (generalized): Secondary | ICD-10-CM | POA: Diagnosis not present

## 2023-12-22 MED ORDER — VALSARTAN 320 MG PO TABS: 320.0000 mg | ORAL_TABLET | Freq: Every day | ORAL | 5 refills | Status: DC

## 2023-12-22 NOTE — Telephone Encounter (Signed)
 FYI Only or Action Required?: Action required by provider: medication refill request. (Increase dosage)  Patient was last seen in primary care on 10/16/2023 by [name], MD. Called Nurse Triage reporting Advice Only. Symptoms began several days ago. Interventions attempted: Other: patient sent to ED on Friday and recommended increase her valsartan . Symptoms are: hypertension (no recent BP readings available per staff) stable.  Triage Disposition: Call PCP Now  Patient/caregiver understands and will follow disposition?: Yes               Copied from CRM [id number]. Topic: Clinical - Red Word Triage >> Dec 22, 2023  9:09 AM Shelba HERO wrote: Red Word that prompted transfer to Nurse Triage: Nikki from [facility] long term care, stated that she needed to speak to a nurse, would not let me know exactly the issue at hand. Reason for Disposition  [1] Follow-up call from patient regarding patient's clinical status AND [2] information urgent  Answer Assessment - Initial Assessment Questions 1. REASON FOR CALL or QUESTION: What is your reason for calling today? or How can I best help you? or What question do you have that I can help answer?     Patient was seen in ED on Friday evening (hypertension) and discharged back to facility. On discharge paperwork ED provider recommends increase valsartan  to 320mg , nurse supervisor states they need an order from PCP to give increased dose.  2. CALLER: Document the source of call. (e.g., laboratory, patient).     [place], Merchandiser, retail at [employer]. [phone])  Protocols used: PCP Call - No Triage-A-AH

## 2023-12-23 DIAGNOSIS — M6281 Muscle weakness (generalized): Secondary | ICD-10-CM | POA: Diagnosis not present

## 2023-12-23 NOTE — Telephone Encounter (Signed)
 The 320 mg valsartan  was sent She has a follow-up visit this week please keep

## 2023-12-24 ENCOUNTER — Encounter: Payer: Self-pay | Admitting: Family Medicine

## 2023-12-24 ENCOUNTER — Ambulatory Visit (INDEPENDENT_AMBULATORY_CARE_PROVIDER_SITE_OTHER): Admitting: Family Medicine

## 2023-12-24 DIAGNOSIS — R350 Frequency of micturition: Secondary | ICD-10-CM | POA: Diagnosis not present

## 2023-12-24 DIAGNOSIS — I10 Essential (primary) hypertension: Secondary | ICD-10-CM

## 2023-12-24 DIAGNOSIS — I700 Atherosclerosis of aorta: Secondary | ICD-10-CM | POA: Diagnosis not present

## 2023-12-24 DIAGNOSIS — K219 Gastro-esophageal reflux disease without esophagitis: Secondary | ICD-10-CM

## 2023-12-24 DIAGNOSIS — R6883 Chills (without fever): Secondary | ICD-10-CM

## 2023-12-24 DIAGNOSIS — E1159 Type 2 diabetes mellitus with other circulatory complications: Secondary | ICD-10-CM

## 2023-12-24 LAB — POCT URINALYSIS DIP (CLINITEK)
Bilirubin, UA: NEGATIVE
Glucose, UA: NEGATIVE mg/dL
Ketones, POC UA: NEGATIVE mg/dL
Nitrite, UA: NEGATIVE
POC PROTEIN,UA: 30 — AB
Spec Grav, UA: 1.01 (ref 1.010–1.025)
Urobilinogen, UA: 0.2 U/dL
pH, UA: 6.5 (ref 5.0–8.0)

## 2023-12-24 MED ORDER — PANTOPRAZOLE SODIUM 40 MG PO TBEC: 40.0000 mg | DELAYED_RELEASE_TABLET | Freq: Two times a day (BID) | ORAL | 3 refills | Status: DC

## 2023-12-24 MED ORDER — CIPROFLOXACIN HCL 500 MG PO TABS
500.0000 mg | ORAL_TABLET | Freq: Two times a day (BID) | ORAL | 0 refills | Status: DC
Start: 2023-12-24 — End: 2023-12-29

## 2023-12-24 NOTE — Assessment & Plan Note (Signed)
 UA consistent with UTI.  Sending culture.  Placing empirically on Cipro  while awaiting culture.  Cipro  was used given resistance pattern from prior cultures.  Did not use Bactrim  due to interaction with metformin .

## 2023-12-24 NOTE — Patient Instructions (Signed)
 Lab on Friday.  Antibiotic as prescribed.  Follow up next week with Dr. Glendia.  Increase Protonix  to twice daily.

## 2023-12-24 NOTE — Progress Notes (Addendum)
 Subjective:  Patient ID: [name], female    DOB: 1932-01-13  Age: 89 y.o. MRN: [id number]  CC:   Chief Complaint  Patient presents with   Follow-up    Hypertension and burning chest pain  No appetite and chills     HPI:  89 year old female presents for hospital follow-up.  Recently admitted from 6/14 to 6/15.  Presented with atypical chest pain.  Workup was negative.  Additionally, was recently seen in the ER on 6/20.  Presented with elevated blood pressures.  Workup unremarkable.  Valsartan  recently increased.  Patient presents today reporting that she is not feeling well.  Patient states that she has fatigue and feels very cold.  Reports chills and decreased appetite.  Also reports urinary frequency.  Patient states that she does not feel well.  She is very concerned.   Patient Active Problem List   Diagnosis Date Noted   Frequency of urination 12/24/2023   Acute blood loss anemia 04/17/2023   Gastritis and gastroduodenitis 04/10/2023   Abdominal pain, chronic, epigastric 04/08/2023   Mesenteric artery stenosis (HCC) 04/07/2023   Diabetic neuropathy (HCC) 11/30/2022   S/P total knee arthroplasty, right 04/12/2022   Aortic atherosclerosis (HCC) 02/25/2022   Chronic non-seasonal allergic rhinitis 02/25/2022   GERD (gastroesophageal reflux disease) 02/17/2022   Myalgia due to statin 02/15/2020   Cognitive dysfunction 03/04/2019   Generalized anxiety disorder 06/05/2018   Osteoarthritis of right knee 12/02/2017   Hyperlipidemia associated with type 2 diabetes mellitus (HCC) 10/02/2016   Insomnia 10/02/2016   Essential hypertension 12/15/2014   Type 2 diabetes mellitus with atherosclerosis of aorta (HCC) 02/15/2011    Social Hx   Social History   Socioeconomic History   Marital status: Widowed    Spouse name: Not on file   Number of children: Not on file   Years of education: Not on file   Highest education level: Not on file  Occupational History   Not on  file  Tobacco Use   Smoking status: Never   Smokeless tobacco: Never  Vaping Use   Vaping status: Never Used  Substance and Sexual Activity   Alcohol  use: No   Drug use: No   Sexual activity: Never    Birth control/protection: Abstinence  Other Topics Concern   Not on file  Social History Narrative   Not on file   Social Drivers of Health   Financial Resource Strain: Low Risk  (09/06/2022)   Overall Financial Resource Strain (CARDIA)    Difficulty of Paying Living Expenses: Not hard at all  Food Insecurity: No Food Insecurity (12/13/2023)   Hunger Vital Sign    Worried About Running Out of Food in the Last Year: Never true    Ran Out of Food in the Last Year: Never true  Transportation Needs: No Transportation Needs (12/13/2023)   PRAPARE - Administrator, Civil Service (Medical): No    Lack of Transportation (Non-Medical): No  Physical Activity: Sufficiently Active (09/06/2022)   Exercise Vital Sign    Days of Exercise per Week: 5 days    Minutes of Exercise per Session: 30 min  Stress: No Stress Concern Present (09/06/2022)   [employer] of Occupational Health - Occupational Stress Questionnaire    Feeling of Stress : Not at all  Social Connections: Moderately Isolated (12/13/2023)   Social Connection and Isolation Panel    Frequency of Communication with Friends and Family: More than three times a week    Frequency of  Social Gatherings with Friends and Family: More than three times a week    Attends Religious Services: 1 to 4 times per year    Active Member of [employer] or Organizations: No    Attends Banker Meetings: Never    Marital Status: Widowed    Review of Systems Per HPI  Objective:  BP (!) 159/77   Pulse 92   Temp 98.2 F (36.8 C)   Ht 5' 8 (1.727 m)   Wt 172 lb (78 kg)   SpO2 98%   BMI 26.15 kg/m      12/24/2023    1:30 PM 12/19/2023   11:09 PM 12/19/2023   10:00 PM  BP/Weight  Systolic BP 159 144 147  Diastolic BP 77 57  55  Wt. (Lbs) 172    BMI 26.15 kg/m2      Physical Exam Constitutional:      General: She is not in acute distress. HENT:     Head: Normocephalic and atraumatic.   Cardiovascular:     Rate and Rhythm: Normal rate and regular rhythm.  Pulmonary:     Effort: Pulmonary effort is normal.     Breath sounds: Normal breath sounds.  Abdominal:     General: There is no distension.     Palpations: Abdomen is soft.   Neurological:     Mental Status: She is alert. Mental status is at baseline.     Lab Results  Component Value Date   WBC 7.5 12/19/2023   HGB 12.2 12/19/2023   HCT 36.0 12/19/2023   PLT 335 12/19/2023   GLUCOSE 120 (H) 12/19/2023   CHOL 220 (H) 08/01/2023   TRIG 257 (H) 08/01/2023   HDL 54 08/01/2023   LDLCALC 121 (H) 08/01/2023   ALT 17 12/19/2023   AST 19 12/19/2023   NA 133 (L) 12/19/2023   K 3.8 12/19/2023   CL 96 (L) 12/19/2023   CREATININE 0.83 12/19/2023   BUN 17 12/19/2023   CO2 24 12/19/2023   TSH 1.739 09/17/2023   INR 1.1 04/11/2023   HGBA1C 6.6 (H) 08/01/2023   MICROALBUR 1.51 11/18/2013     Assessment & Plan:  Chills -     POCT URINALYSIS DIP (CLINITEK) -     Urine Culture  Frequency of urination Assessment & Plan: UA consistent with UTI.  Sending culture.  Placing empirically on Cipro  while awaiting culture.  Cipro  was used given resistance pattern from prior cultures.  Did not use Bactrim  due to interaction with metformin .  Orders: -     Urine Culture  Type 2 diabetes mellitus with atherosclerosis of aorta (HCC) -     Hemoglobin A1c  Essential hypertension Assessment & Plan: BP mildly elevated here today.  I discussed this with her primary care physician.  We elected to continue her current medications.  Follow-up BMP at the end of the week.  She will follow-up with her PCP next week.  Orders: -     Basic metabolic panel with GFR  Gastroesophageal reflux disease without esophagitis Assessment & Plan: Patient experiencing  recent worsening of symptoms which manifest as burning chest pain.  Increasing Protonix  to twice daily.   Other orders -     Ciprofloxacin  HCl; Take 1 tablet (500 mg total) by mouth 2 (two) times daily.  Dispense: 14 tablet; Refill: 0 -     Pantoprazole  Sodium; Take 1 tablet (40 mg total) by mouth 2 (two) times daily before a meal.  Dispense: 60 tablet; Refill:  3    Follow-up: Next week  Jacqulyn Ahle DO [facility]

## 2023-12-24 NOTE — Assessment & Plan Note (Signed)
 BP mildly elevated here today.  I discussed this with her primary care physician.  We elected to continue her current medications.  Follow-up BMP at the end of the week.  She will follow-up with her PCP next week.

## 2023-12-24 NOTE — Assessment & Plan Note (Signed)
 Patient experiencing recent worsening of symptoms which manifest as burning chest pain.  Increasing Protonix  to twice daily.

## 2023-12-25 ENCOUNTER — Inpatient Hospital Stay: Admitting: Family Medicine

## 2023-12-26 DIAGNOSIS — E1159 Type 2 diabetes mellitus with other circulatory complications: Secondary | ICD-10-CM | POA: Diagnosis not present

## 2023-12-26 DIAGNOSIS — I700 Atherosclerosis of aorta: Secondary | ICD-10-CM | POA: Diagnosis not present

## 2023-12-26 DIAGNOSIS — I10 Essential (primary) hypertension: Secondary | ICD-10-CM | POA: Diagnosis not present

## 2023-12-27 ENCOUNTER — Other Ambulatory Visit: Payer: Self-pay

## 2023-12-27 ENCOUNTER — Encounter (HOSPITAL_COMMUNITY): Payer: Self-pay | Admitting: Emergency Medicine

## 2023-12-27 ENCOUNTER — Inpatient Hospital Stay (HOSPITAL_COMMUNITY)
Admission: EM | Admit: 2023-12-27 | Discharge: 2023-12-29 | DRG: 690 | Attending: Internal Medicine | Admitting: Internal Medicine

## 2023-12-27 DIAGNOSIS — E1165 Type 2 diabetes mellitus with hyperglycemia: Secondary | ICD-10-CM | POA: Diagnosis present

## 2023-12-27 DIAGNOSIS — E871 Hypo-osmolality and hyponatremia: Secondary | ICD-10-CM | POA: Diagnosis not present

## 2023-12-27 DIAGNOSIS — Z95828 Presence of other vascular implants and grafts: Secondary | ICD-10-CM | POA: Diagnosis not present

## 2023-12-27 DIAGNOSIS — R338 Other retention of urine: Secondary | ICD-10-CM | POA: Diagnosis not present

## 2023-12-27 DIAGNOSIS — Z823 Family history of stroke: Secondary | ICD-10-CM

## 2023-12-27 DIAGNOSIS — Z96651 Presence of right artificial knee joint: Secondary | ICD-10-CM | POA: Diagnosis present

## 2023-12-27 DIAGNOSIS — R339 Retention of urine, unspecified: Secondary | ICD-10-CM | POA: Diagnosis not present

## 2023-12-27 DIAGNOSIS — E785 Hyperlipidemia, unspecified: Secondary | ICD-10-CM | POA: Diagnosis present

## 2023-12-27 DIAGNOSIS — Z833 Family history of diabetes mellitus: Secondary | ICD-10-CM

## 2023-12-27 DIAGNOSIS — Z66 Do not resuscitate: Secondary | ICD-10-CM | POA: Diagnosis not present

## 2023-12-27 DIAGNOSIS — Z79899 Other long term (current) drug therapy: Secondary | ICD-10-CM

## 2023-12-27 DIAGNOSIS — N390 Urinary tract infection, site not specified: Secondary | ICD-10-CM | POA: Diagnosis not present

## 2023-12-27 DIAGNOSIS — Z7984 Long term (current) use of oral hypoglycemic drugs: Secondary | ICD-10-CM | POA: Diagnosis not present

## 2023-12-27 DIAGNOSIS — Z888 Allergy status to other drugs, medicaments and biological substances status: Secondary | ICD-10-CM | POA: Diagnosis not present

## 2023-12-27 DIAGNOSIS — Z8041 Family history of malignant neoplasm of ovary: Secondary | ICD-10-CM

## 2023-12-27 DIAGNOSIS — Z8249 Family history of ischemic heart disease and other diseases of the circulatory system: Secondary | ICD-10-CM

## 2023-12-27 DIAGNOSIS — E1142 Type 2 diabetes mellitus with diabetic polyneuropathy: Secondary | ICD-10-CM | POA: Diagnosis not present

## 2023-12-27 DIAGNOSIS — B965 Pseudomonas (aeruginosa) (mallei) (pseudomallei) as the cause of diseases classified elsewhere: Secondary | ICD-10-CM | POA: Diagnosis not present

## 2023-12-27 DIAGNOSIS — D649 Anemia, unspecified: Secondary | ICD-10-CM | POA: Diagnosis not present

## 2023-12-27 DIAGNOSIS — I959 Hypotension, unspecified: Secondary | ICD-10-CM | POA: Diagnosis not present

## 2023-12-27 DIAGNOSIS — F411 Generalized anxiety disorder: Secondary | ICD-10-CM | POA: Diagnosis not present

## 2023-12-27 DIAGNOSIS — N3000 Acute cystitis without hematuria: Secondary | ICD-10-CM | POA: Diagnosis not present

## 2023-12-27 DIAGNOSIS — I10 Essential (primary) hypertension: Secondary | ICD-10-CM | POA: Diagnosis present

## 2023-12-27 DIAGNOSIS — K219 Gastro-esophageal reflux disease without esophagitis: Secondary | ICD-10-CM | POA: Diagnosis not present

## 2023-12-27 DIAGNOSIS — K551 Chronic vascular disorders of intestine: Secondary | ICD-10-CM | POA: Diagnosis not present

## 2023-12-27 DIAGNOSIS — Z809 Family history of malignant neoplasm, unspecified: Secondary | ICD-10-CM | POA: Diagnosis not present

## 2023-12-27 DIAGNOSIS — Z90710 Acquired absence of both cervix and uterus: Secondary | ICD-10-CM

## 2023-12-27 LAB — CBC WITH DIFFERENTIAL/PLATELET
Abs Immature Granulocytes: 0.04 10*3/uL (ref 0.00–0.07)
Basophils Absolute: 0 10*3/uL (ref 0.0–0.1)
Basophils Relative: 0 %
Eosinophils Absolute: 0.1 10*3/uL (ref 0.0–0.5)
Eosinophils Relative: 1 %
HCT: 32.7 % — ABNORMAL LOW (ref 36.0–46.0)
Hemoglobin: 11.2 g/dL — ABNORMAL LOW (ref 12.0–15.0)
Immature Granulocytes: 1 %
Lymphocytes Relative: 18 %
Lymphs Abs: 1.4 10*3/uL (ref 0.7–4.0)
MCH: 29.7 pg (ref 26.0–34.0)
MCHC: 34.3 g/dL (ref 30.0–36.0)
MCV: 86.7 fL (ref 80.0–100.0)
Monocytes Absolute: 0.8 10*3/uL (ref 0.1–1.0)
Monocytes Relative: 11 %
Neutro Abs: 5.3 10*3/uL (ref 1.7–7.7)
Neutrophils Relative %: 69 %
Platelets: 323 10*3/uL (ref 150–400)
RBC: 3.77 MIL/uL — ABNORMAL LOW (ref 3.87–5.11)
RDW: 12.9 % (ref 11.5–15.5)
WBC: 7.7 10*3/uL (ref 4.0–10.5)
nRBC: 0 % (ref 0.0–0.2)

## 2023-12-27 LAB — URINALYSIS, W/ REFLEX TO CULTURE (INFECTION SUSPECTED)
Bacteria, UA: NONE SEEN
Bilirubin Urine: NEGATIVE
Glucose, UA: NEGATIVE mg/dL
Hgb urine dipstick: NEGATIVE
Ketones, ur: NEGATIVE mg/dL
Leukocytes,Ua: NEGATIVE
Nitrite: NEGATIVE
Protein, ur: 100 mg/dL — AB
Specific Gravity, Urine: 1.008 (ref 1.005–1.030)
pH: 6 (ref 5.0–8.0)

## 2023-12-27 LAB — BASIC METABOLIC PANEL WITH GFR
Anion gap: 12 (ref 5–15)
BUN/Creatinine Ratio: 15 (ref 12–28)
BUN: 16 mg/dL (ref 10–36)
BUN: 19 mg/dL (ref 8–23)
CO2: 21 mmol/L (ref 20–29)
CO2: 23 mmol/L (ref 22–32)
Calcium: 9.6 mg/dL (ref 8.7–10.3)
Calcium: 9 mg/dL (ref 8.9–10.3)
Chloride: 90 mmol/L — ABNORMAL LOW (ref 96–106)
Chloride: 85 mmol/L — ABNORMAL LOW (ref 98–111)
Creatinine, Ser: 1.04 mg/dL — ABNORMAL HIGH (ref 0.57–1.00)
Creatinine, Ser: 0.92 mg/dL (ref 0.44–1.00)
GFR, Estimated: 59 mL/min — ABNORMAL LOW (ref >60)
Glucose, Bld: 143 mg/dL — ABNORMAL HIGH (ref 70–99)
Glucose: 121 mg/dL — ABNORMAL HIGH (ref 70–99)
Potassium: 4.6 mmol/L (ref 3.5–5.2)
Potassium: 4.1 mmol/L (ref 3.5–5.1)
Sodium: 128 mmol/L — ABNORMAL LOW (ref 134–144)
Sodium: 120 mmol/L — ABNORMAL LOW (ref 135–145)
eGFR: 51 mL/min/{1.73_m2} — ABNORMAL LOW (ref >59)

## 2023-12-27 LAB — LACTIC ACID, PLASMA
Lactic Acid, Venous: 1.5 mmol/L (ref 0.5–1.9)
Lactic Acid, Venous: 0.8 mmol/L (ref 0.5–1.9)

## 2023-12-27 LAB — HEMOGLOBIN A1C
Est. average glucose Bld gHb Est-mCnc: 134 mg/dL
Hgb A1c MFr Bld: 6.3 % — ABNORMAL HIGH (ref 4.8–5.6)

## 2023-12-27 LAB — SODIUM: Sodium: 124 mmol/L — ABNORMAL LOW (ref 135–145)

## 2023-12-27 MED ORDER — SODIUM CHLORIDE 0.9 % IV SOLN
1.0000 g | INTRAVENOUS | Status: DC
  Administered 2023-12-28: 1 g via INTRAVENOUS
  Filled 2023-12-27: qty 10

## 2023-12-27 MED ORDER — SODIUM CHLORIDE 0.9 % IV BOLUS
1000.0000 mL | Freq: Once | INTRAVENOUS | Status: AC
  Administered 2023-12-27: 1000 mL via INTRAVENOUS

## 2023-12-27 MED ORDER — LACTATED RINGERS IV SOLN: INTRAVENOUS | Status: DC

## 2023-12-27 MED ORDER — SODIUM CHLORIDE 0.9 % IV SOLN
2.0000 g | Freq: Once | INTRAVENOUS | Status: AC
  Administered 2023-12-27: 2 g via INTRAVENOUS
  Filled 2023-12-27: qty 20

## 2023-12-27 NOTE — H&P (Incomplete)
 History and Physical    Patient: [name] FMW:[id number] DOB: 1932/04/17 DOA: 12/27/2023 DOS: the patient was seen and examined on 12/27/2023 PCP: [name], MD  Patient coming from: ALF/ILF  Chief Complaint:  Chief Complaint  Patient presents with  . Urinary Retention   HPI: [name] is a 89 y.o. female with medical history significant of hypertension, hyperlipidemia, T2DM, GERD mesenteric artery stenosis and stent placement to presents to the emergency department via EMS from long-term care facility complained of nausea and decreased appetite, she also reported lower abdominal discomfort and difficulty urinating.  She complained of chills without fever. She saw her PCP several days ago and complained of not feeling well and also complained of fatigue, decreased appetite, chills without fever and increased urinary frequency.  Urinalysis done was consistent with UTI, she was placed on Cipro  while awaiting urine culture. She was admitted from 6/14 to 6/15 due to atypical chest pain which was nonexertional in nature.  ED Course:  In the emergency department, BP was 157/55, other vital signs were within normal range.  Workup in the ED showed normocytic anemia, BMP was normal except for hyponatremia (Na 120), chloride 85, blood glucose 143.  Lactic acid was normal, urinalysis was normal.  Initial urine culture was positive for gram-negative rods, urine culture pending. She was noted to have urinary retention and Foley catheter was placed with 750 mL of urine in the Foley catheter.  Empiric IV ceftriaxone  was given.  [facility] was asked to admit patient.  Review of Systems: Review of systems as noted in the HPI. All other systems reviewed and are negative.   Past Medical History:  Diagnosis Date  . Blood transfusion without reported diagnosis   . Cognitive dysfunction 03/04/2019   Patient scores 21 out of 30 on [place] cognitive assessment September 2020  . Diabetes mellitus  without complication (HCC)   . Diabetic peripheral neuropathy associated with type 2 diabetes mellitus (HCC) 02/25/2022  . Diverticulitis   . Frailty 03/04/2019  . H/O bilateral breast reduction surgery   . Hyperlipidemia    a. intolerant to statins.   . Hypertension    Past Surgical History:  Procedure Laterality Date  . ABDOMINAL HYSTERECTOMY  2003  . APPENDECTOMY     age 16  . BIOPSY  04/10/2023   Procedure: BIOPSY;  Surgeon: Cinderella Deatrice FALCON, MD;  Location: [facility];  Service: Endoscopy;;  . BREAST REDUCTION SURGERY     age 73  . COLON SURGERY Left 2011   Hemicolectomy due to diverticulitis  . ESOPHAGOGASTRODUODENOSCOPY (EGD) WITH PROPOFOL  N/A 04/10/2023   Procedure: ESOPHAGOGASTRODUODENOSCOPY (EGD) WITH PROPOFOL ;  Surgeon: Cinderella Deatrice FALCON, MD;  Location: [facility];  Service: Endoscopy;  Laterality: N/A;  . EYE SURGERY  03/30/2009   cataract  . IR ANGIOGRAM VISCERAL SELECTIVE  04/11/2023  . IR INTRAVASCULAR ULTRASOUND NON CORONARY  04/11/2023  . IR TRANSCATH PLC STENT 1ST ART NOT LE CV CAR VERT CAR  04/11/2023  . IR US  GUIDE VASC ACCESS RIGHT  04/11/2023  . KNEE SURGERY Right 2003   knee cap  . LAPAROSCOPIC INCISIONAL / UMBILICAL / VENTRAL HERNIA REPAIR  02/23/2007  . REDUCTION MAMMAPLASTY Bilateral 2001  . REFRACTIVE SURGERY    . TOTAL KNEE ARTHROPLASTY Right 04/10/2022   Procedure: TOTAL KNEE ARTHROPLASTY;  Surgeon: Gerome Charleston, MD;  Location: [facility];  Service: Orthopedics;  Laterality: Right;  adductor canal    Social History:  reports that she has never smoked. She  has never used smokeless tobacco. She reports that she does not drink alcohol  and does not use drugs.   Allergies  Allergen Reactions  . Statins Other (See Comments)    Severe myalgias  . Phenergan [Promethazine Hcl] Other (See Comments)    Hallucinations   . Hydrocortisone Itching  . Lisinopril Other (See Comments)    Lethargy, Fatigue  . Prednisone  Rash  . Trazodone  And Nefazodone  Cough    Family History  Problem Relation Age of Onset  . Cancer Mother        ovaian  . Hypertension Father        kidney  . Stroke Father   . Cancer Father   . Hypertension Sister   . Diabetes Brother     ***  Prior to Admission medications   Medication Sig Start Date End Date Taking? Authorizing Provider  acetaminophen  (TYLENOL ) 325 MG tablet TAKE (2) TABLETS BY MOUTH EVERY SIX HOURS AS NEEDED FOR PAIN. MAX 3GM IN 24 HOURS. Patient taking differently: Take 650 mg by mouth every 6 (six) hours as needed for mild pain (pain score 1-3). 07/28/23   [name], MD  albuterol  (VENTOLIN  HFA) 108 (90 Base) MCG/ACT inhaler INHALE 2 PUFFS INTO THE LUNGS EVERY SIX HOURS AS NEEDED FOR WHEEZING OR SHORTNESS OF BREATH. 09/26/23   [name], MD  ALPRAZolam  (XANAX ) 0.25 MG tablet Take 1 tablet (0.25 mg total) by mouth at bedtime. 12/14/23   Maree, Pratik D, DO  amLODipine  (NORVASC ) 10 MG tablet TAKE (1) TABLET BY MOUTH ONCE DAILY. Patient taking differently: Take 10 mg by mouth daily. 10/07/23   [name], MD  aspirin  (ASPIRIN  LOW DOSE) 81 MG chewable tablet CHEW (1) TABLET BY MOUTH ONCE DAILY. Patient taking differently: Chew 81 mg by mouth daily. 09/06/23   [name], MD  beta carotene (BETA CAROTENE PROVITAMIN A) 25000 UNIT capsule TAKE (1) CAPSULE BY MOUTH AT BEDTIME. Patient taking differently: Take 25,000 Units by mouth at bedtime. 10/07/23   [name], MD  BISACODYL  5 MG EC tablet TAKE 1 TABLET BY MOUTH ONCE DAILY AS NEEDED FOR MODERATE CONSTIPATION. Patient taking differently: Take 5 mg by mouth daily as needed for moderate constipation. 12/20/22   [name], MD  ciprofloxacin  (CIPRO ) 500 MG tablet Take 1 tablet (500 mg total) by mouth 2 (two) times daily. 12/24/23   Cook, Jayce G, DO  cyanocobalamin  1000 MCG tablet Take 1,000 mcg by mouth at bedtime.    [provider]  cycloSPORINE  (RESTASIS ) 0.05 % ophthalmic emulsion Place 1 drop into both eyes 2 (two)  times daily.    [provider]  dicyclomine  (BENTYL ) 10 MG capsule TAKE 1 CAPSULE BY MOUTH TWICE DAILY AS NEEDED FOR ABDOMINAL CRAMPS AND LOOSE STOOL. Patient taking differently: Take 10 mg by mouth 2 (two) times daily as needed (abdominal cramps and loose stool). TAKE 1 CAPSULE BY MOUTH TWICE DAILY AS NEEDED FOR ABDOMINAL CRAMPS AND LOOSE STOOL. 11/21/22   [name], MD  ferrous sulfate  (FEROSUL) 325 (65 FE) MG tablet TAKE (1) TABLET BY MOUTH EVERY OTHER DAY. Patient taking differently: Take 325 mg by mouth every other day. 10/07/23   [name], MD  glucose blood (EASYMAX TEST) test strip CHECK BLOOD SUGAR ONCE DAILY.(CALL MD IF BS BELOW 60: OR IF BS ABOVE 400) 10/07/23   Luking, Glendia LABOR, MD  hydrALAZINE  (APRESOLINE ) 50 MG tablet TAKE (1) TABLET BY MOUTH EVERY EIGHT HOURS. Patient taking differently: Take  50 mg by mouth every 8 (eight) hours. 11/03/23   Luking, Glendia LABOR, MD  IVIZIA DRY EYES 0.5 % SOLN Place 1 drop into both eyes 2 (two) times daily. 09/22/23   [provider]  loratadine  (ALLERGY RELIEF) 10 MG tablet TAKE (1) TABLET BY MOUTH ONCE DAILY. Patient taking differently: Take 10 mg by mouth daily. 10/07/23   [name], MD  Melatonin 10 MG TABS Take 10 mg by mouth at bedtime. 09/25/23   Cook, Jayce G, DO  metFORMIN  (GLUCOPHAGE ) 500 MG tablet TAKE (1) TABLET BY MOUTH IN THE MORNING & (1/2) TABLET (250MG ) BY MOUTH AT SUPPER Patient taking differently: Take 500 mg by mouth 2 (two) times daily with a meal. TAKE (1) TABLET BY MOUTH IN THE MORNING & (1/2) TABLET (250MG ) BY MOUTH AT SUPPER 10/07/23   Luking, Glendia LABOR, MD  Multiple Vitamins-Minerals (PRESERVISION AREDS 2) CAPS Take 1 capsule by mouth in the morning and at bedtime.    [provider]  ondansetron  (ZOFRAN -ODT) 4 MG disintegrating tablet 4mg  ODT q4 hours prn nausea/vomit Patient taking differently: Take 4 mg by mouth every 4 (four) hours as needed for nausea or vomiting. 09/05/23   Suzette Pac, MD   pantoprazole  (PROTONIX ) 40 MG tablet Take 1 tablet (40 mg total) by mouth 2 (two) times daily before a meal. 12/24/23   Cook, Jayce G, DO  polyethylene glycol powder (GOODSENSE CLEARLAX) 17 GM/SCOOP powder 1 scoop in 8 ounces water  once daily as needed for constipation Patient taking differently: Take 17 g by mouth daily as needed for mild constipation. 1 scoop in 8 ounces water  once daily as needed for constipation 06/03/23   Alphonsa Glendia A, MD  sertraline  (ZOLOFT ) 25 MG tablet TAKE (1) TABLET BY MOUTH ONCE DAILY. Patient taking differently: Take 25 mg by mouth daily. 10/07/23   [name], MD  sodium chloride  1 g tablet Take 1 tablet (1 g total) by mouth daily. 10/07/23   [name], MD  valsartan  (DIOVAN ) 320 MG tablet Take 1 tablet (320 mg total) by mouth daily. 12/22/23   [name], MD  Vitamin D , Cholecalciferol , 10 MCG (400 UNIT) TABS TAKE (1) TABLET BY MOUTH ONCE DAILY. Patient taking differently: Take 1 tablet by mouth daily. 10/07/23   [name], MD    Physical Exam: BP (!) 150/55   Pulse 82   Temp 98.5 F (36.9 C) (Oral)   Resp 17   Ht 5' 8 (1.727 m)   Wt 78 kg   SpO2 93%   BMI 26.15 kg/m   General: 89 y.o. year-old female well developed well nourished in no acute distress.  Alert and oriented x3. HEENT: NCAT, EOMI Neck: Supple, trachea medial Cardiovascular: Regular rate and rhythm with no rubs or gallops.  No thyromegaly or JVD noted.  No lower extremity edema. 2/4 pulses in all 4 extremities. Respiratory: Clear to auscultation with no wheezes or rales. Good inspiratory effort. Abdomen: Soft, suprapubic tenderness without guarding.  Nondistended with normal bowel sounds x4 quadrants. Muskuloskeletal: No cyanosis, clubbing or edema noted bilaterally Neuro: CN II-XII intact, strength 5/5 x 4, sensation, reflexes intact Skin: No ulcerative lesions noted or rashes Psychiatry: Judgement and insight appear normal. Mood is appropriate for condition and setting           Labs on Admission:  Basic Metabolic Panel: Recent Labs  Lab 12/26/23 0958 12/27/23 2043 12/27/23 2224  NA 128* 120* 124*  K 4.6 4.1  --  CL 90* 85*  --   CO2 21 23  --   GLUCOSE 121* 143*  --   BUN 16 19  --   CREATININE 1.04* 0.92  --   CALCIUM 9.6 9.0  --    Liver Function Tests: No results for input(s): AST, ALT, ALKPHOS, BILITOT, PROT, ALBUMIN in the last 168 hours. No results for input(s): LIPASE, AMYLASE in the last 168 hours. No results for input(s): AMMONIA in the last 168 hours. CBC: Recent Labs  Lab 12/27/23 2043  WBC 7.7  NEUTROABS 5.3  HGB 11.2*  HCT 32.7*  MCV 86.7  PLT 323   Cardiac Enzymes: No results for input(s): CKTOTAL, CKMB, CKMBINDEX, TROPONINI in the last 168 hours.  BNP (last 3 results) Recent Labs    04/08/23 0247 07/24/23 1456 09/22/23 1848  BNP 83.0 88.0 88.0    ProBNP (last 3 results) No results for input(s): PROBNP in the last 8760 hours.  CBG: No results for input(s): GLUCAP in the last 168 hours.  Radiological Exams on Admission: No results found.  EKG: I independently viewed the EKG done and my findings are as followed: EKG was not done in the ED  Assessment/Plan Present on Admission: . UTI (urinary tract infection)  Principal Problem:   UTI (urinary tract infection)  UTI POA Urinary retention Patient was started on ciprofloxacin  as an outpatient for UTI.  Outpatient urine culture resulted was positive for gram-negative rods.  Presented to the ED due to ongoing symptoms with suprapubic tenderness. She was started on IV ceftriaxone .  Continue IV ceftriaxone  pending culture sensitivity Foley catheter was placed due to urinary retention  Hyponatremia Na 120 > 124 Continue gentle hydration Continue to monitor sodium with serial BMPs Urine osmolality, serum osmolality and urine sodium will be checked  T2DM with hyperglycemia Hemoglobin A1c on 12/26/2023 was 6.3 Continue ISS  and hypoglycemia protocol  Essential hypertension    DVT prophylaxis: ***   Code Status: ***   Family Communication: ***   Disposition Plan: ***   Consults called: ***   Admission status: ***     Posey Maier MD [facility] Pager [phone]  If 7PM-7AM, please contact night-coverage [email or web address] Password [facility]  12/27/2023, 11:34 PM       Review of Systems: {ROS_Text:26778} Past Medical History:  Diagnosis Date  . Blood transfusion without reported diagnosis   . Cognitive dysfunction 03/04/2019   Patient scores 21 out of 30 on [place] cognitive assessment September 2020  . Diabetes mellitus without complication (HCC)   . Diabetic peripheral neuropathy associated with type 2 diabetes mellitus (HCC) 02/25/2022  . Diverticulitis   . Frailty 03/04/2019  . H/O bilateral breast reduction surgery   . Hyperlipidemia    a. intolerant to statins.   . Hypertension    Past Surgical History:  Procedure Laterality Date  . ABDOMINAL HYSTERECTOMY  2003  . APPENDECTOMY     age 40  . BIOPSY  04/10/2023   Procedure: BIOPSY;  Surgeon: Cinderella Deatrice FALCON, MD;  Location: [facility];  Service: Endoscopy;;  . BREAST REDUCTION SURGERY     age 40  . COLON SURGERY Left 2011   Hemicolectomy due to diverticulitis  . ESOPHAGOGASTRODUODENOSCOPY (EGD) WITH PROPOFOL  N/A 04/10/2023   Procedure: ESOPHAGOGASTRODUODENOSCOPY (EGD) WITH PROPOFOL ;  Surgeon: Cinderella Deatrice FALCON, MD;  Location: [facility];  Service: Endoscopy;  Laterality: N/A;  . EYE SURGERY  03/30/2009   cataract  . IR ANGIOGRAM VISCERAL SELECTIVE  04/11/2023  . IR INTRAVASCULAR ULTRASOUND NON  CORONARY  04/11/2023  . IR TRANSCATH PLC STENT 1ST ART NOT LE CV CAR VERT CAR  04/11/2023  . IR US  GUIDE VASC ACCESS RIGHT  04/11/2023  . KNEE SURGERY Right 2003   knee cap  . LAPAROSCOPIC INCISIONAL / UMBILICAL / VENTRAL HERNIA REPAIR  02/23/2007  . REDUCTION MAMMAPLASTY Bilateral 2001  . REFRACTIVE SURGERY    .  TOTAL KNEE ARTHROPLASTY Right 04/10/2022   Procedure: TOTAL KNEE ARTHROPLASTY;  Surgeon: Gerome Charleston, MD;  Location: [facility];  Service: Orthopedics;  Laterality: Right;  adductor canal   Social History:  reports that she has never smoked. She has never used smokeless tobacco. She reports that she does not drink alcohol  and does not use drugs.  Allergies  Allergen Reactions  . Statins Other (See Comments)    Severe myalgias  . Phenergan [Promethazine Hcl] Other (See Comments)    Hallucinations   . Hydrocortisone Itching  . Lisinopril Other (See Comments)    Lethargy, Fatigue  . Prednisone  Rash  . Trazodone  And Nefazodone Cough    Family History  Problem Relation Age of Onset  . Cancer Mother        ovaian  . Hypertension Father        kidney  . Stroke Father   . Cancer Father   . Hypertension Sister   . Diabetes Brother     Prior to Admission medications   Medication Sig Start Date End Date Taking? Authorizing Provider  acetaminophen  (TYLENOL ) 325 MG tablet TAKE (2) TABLETS BY MOUTH EVERY SIX HOURS AS NEEDED FOR PAIN. MAX 3GM IN 24 HOURS. Patient taking differently: Take 650 mg by mouth every 6 (six) hours as needed for mild pain (pain score 1-3). 07/28/23   [name], MD  albuterol  (VENTOLIN  HFA) 108 (90 Base) MCG/ACT inhaler INHALE 2 PUFFS INTO THE LUNGS EVERY SIX HOURS AS NEEDED FOR WHEEZING OR SHORTNESS OF BREATH. 09/26/23   [name], MD  ALPRAZolam  (XANAX ) 0.25 MG tablet Take 1 tablet (0.25 mg total) by mouth at bedtime. 12/14/23   Maree, Pratik D, DO  amLODipine  (NORVASC ) 10 MG tablet TAKE (1) TABLET BY MOUTH ONCE DAILY. Patient taking differently: Take 10 mg by mouth daily. 10/07/23   [name], MD  aspirin  (ASPIRIN  LOW DOSE) 81 MG chewable tablet CHEW (1) TABLET BY MOUTH ONCE DAILY. Patient taking differently: Chew 81 mg by mouth daily. 09/06/23   [name], MD  beta carotene (BETA CAROTENE PROVITAMIN A) 25000 UNIT capsule TAKE (1) CAPSULE BY MOUTH  AT BEDTIME. Patient taking differently: Take 25,000 Units by mouth at bedtime. 10/07/23   [name], MD  BISACODYL  5 MG EC tablet TAKE 1 TABLET BY MOUTH ONCE DAILY AS NEEDED FOR MODERATE CONSTIPATION. Patient taking differently: Take 5 mg by mouth daily as needed for moderate constipation. 12/20/22   [name], MD  ciprofloxacin  (CIPRO ) 500 MG tablet Take 1 tablet (500 mg total) by mouth 2 (two) times daily. 12/24/23   Cook, Jayce G, DO  cyanocobalamin  1000 MCG tablet Take 1,000 mcg by mouth at bedtime.    [provider]  cycloSPORINE  (RESTASIS ) 0.05 % ophthalmic emulsion Place 1 drop into both eyes 2 (two) times daily.    [provider]  dicyclomine  (BENTYL ) 10 MG capsule TAKE 1 CAPSULE BY MOUTH TWICE DAILY AS NEEDED FOR ABDOMINAL CRAMPS AND LOOSE STOOL. Patient taking differently: Take 10 mg by mouth 2 (two) times daily as needed (abdominal cramps and loose stool). TAKE  1 CAPSULE BY MOUTH TWICE DAILY AS NEEDED FOR ABDOMINAL CRAMPS AND LOOSE STOOL. 11/21/22   [name], MD  ferrous sulfate  (FEROSUL) 325 (65 FE) MG tablet TAKE (1) TABLET BY MOUTH EVERY OTHER DAY. Patient taking differently: Take 325 mg by mouth every other day. 10/07/23   [name], MD  glucose blood (EASYMAX TEST) test strip CHECK BLOOD SUGAR ONCE DAILY.(CALL MD IF BS BELOW 60: OR IF BS ABOVE 400) 10/07/23   Luking, Glendia LABOR, MD  hydrALAZINE  (APRESOLINE ) 50 MG tablet TAKE (1) TABLET BY MOUTH EVERY EIGHT HOURS. Patient taking differently: Take 50 mg by mouth every 8 (eight) hours. 11/03/23   Luking, Glendia LABOR, MD  IVIZIA DRY EYES 0.5 % SOLN Place 1 drop into both eyes 2 (two) times daily. 09/22/23   [provider]  loratadine  (ALLERGY RELIEF) 10 MG tablet TAKE (1) TABLET BY MOUTH ONCE DAILY. Patient taking differently: Take 10 mg by mouth daily. 10/07/23   [name], MD  Melatonin 10 MG TABS Take 10 mg by mouth at bedtime. 09/25/23   Cook, Jayce G, DO  metFORMIN  (GLUCOPHAGE ) 500 MG tablet  TAKE (1) TABLET BY MOUTH IN THE MORNING & (1/2) TABLET (250MG ) BY MOUTH AT SUPPER Patient taking differently: Take 500 mg by mouth 2 (two) times daily with a meal. TAKE (1) TABLET BY MOUTH IN THE MORNING & (1/2) TABLET (250MG ) BY MOUTH AT SUPPER 10/07/23   Luking, Glendia LABOR, MD  Multiple Vitamins-Minerals (PRESERVISION AREDS 2) CAPS Take 1 capsule by mouth in the morning and at bedtime.    [provider]  ondansetron  (ZOFRAN -ODT) 4 MG disintegrating tablet 4mg  ODT q4 hours prn nausea/vomit Patient taking differently: Take 4 mg by mouth every 4 (four) hours as needed for nausea or vomiting. 09/05/23   Suzette Pac, MD  pantoprazole  (PROTONIX ) 40 MG tablet Take 1 tablet (40 mg total) by mouth 2 (two) times daily before a meal. 12/24/23   Cook, Jayce G, DO  polyethylene glycol powder (GOODSENSE CLEARLAX) 17 GM/SCOOP powder 1 scoop in 8 ounces water  once daily as needed for constipation Patient taking differently: Take 17 g by mouth daily as needed for mild constipation. 1 scoop in 8 ounces water  once daily as needed for constipation 06/03/23   [name], MD  sertraline  (ZOLOFT ) 25 MG tablet TAKE (1) TABLET BY MOUTH ONCE DAILY. Patient taking differently: Take 25 mg by mouth daily. 10/07/23   [name], MD  sodium chloride  1 g tablet Take 1 tablet (1 g total) by mouth daily. 10/07/23   [name], MD  valsartan  (DIOVAN ) 320 MG tablet Take 1 tablet (320 mg total) by mouth daily. 12/22/23   [name], MD  Vitamin D , Cholecalciferol , 10 MCG (400 UNIT) TABS TAKE (1) TABLET BY MOUTH ONCE DAILY. Patient taking differently: Take 1 tablet by mouth daily. 10/07/23   [name], MD    Physical Exam: Vitals:   12/27/23 2030 12/27/23 2045 12/27/23 2100 12/27/23 2115  BP: (!) 158/50 (!) 151/61 (!) 157/55 (!) 156/50  Pulse: 89 86 84 81  Resp:    17  Temp:      TempSrc:      SpO2: 97% 96% 98% 96%  Weight:      Height:       *** Data Reviewed: {Tip this will not be part of the note  when signed- Document your independent interpretation of telemetry tracing, EKG, lab, Radiology test or any other diagnostic tests.  Add any new diagnostic test ordered today. (Optional):26781} {Results:26384}  Assessment and Plan: No notes have been filed under this hospital service. Service: Hospitalist     Advance Care Planning:   Code Status: Prior ***  Consults: ***  Family Communication: ***  Severity of Illness: {Observation/Inpatient:21159}  Author: Posey Maier, DO 12/27/2023 9:41 PM  For on call review [email or web address].

## 2023-12-27 NOTE — ED Triage Notes (Signed)
 Pt bib EMS for c/o urinary retention. States that she hasn't voided since sometime this morning. Recently dx with UTI and currently on Cipro .

## 2023-12-27 NOTE — ED Provider Notes (Signed)
 [place] EMERGENCY DEPARTMENT AT [facility] ([facility]) Provider Note   CSN: [id number] Arrival date & time: 12/27/23  2005     Patient presents with: Urinary Retention   [name] is a 89 y.o. female.  She said she has been in the emergency department a couple of times over the last few weeks and just saw her primary care doctor few days ago.  He put her on Cipro  for probable UTI.  She has not heard back from the culture.  She has been nauseous and has no appetite.  Lower abdominal discomfort and difficulty urinating.  Last urinated this morning.  No fever but she has had chills.  {Add pertinent medical, surgical, social history, OB history to YEP:67052} The history is provided by the patient.  Dysuria Pain quality:  Burning Pain severity:  Moderate Onset quality:  Gradual Duration:  1 week Timing:  Constant Progression:  Unchanged Relieved by:  Nothing Urinary symptoms: discolored urine, foul-smelling urine and hesitancy   Associated symptoms: abdominal pain and nausea   Associated symptoms: no fever and no vomiting        Prior to Admission medications   Medication Sig Start Date End Date Taking? Authorizing Provider  acetaminophen  (TYLENOL ) 325 MG tablet TAKE (2) TABLETS BY MOUTH EVERY SIX HOURS AS NEEDED FOR PAIN. MAX 3GM IN 24 HOURS. Patient taking differently: Take 650 mg by mouth every 6 (six) hours as needed for mild pain (pain score 1-3). 07/28/23   [name], MD  albuterol  (VENTOLIN  HFA) 108 (90 Base) MCG/ACT inhaler INHALE 2 PUFFS INTO THE LUNGS EVERY SIX HOURS AS NEEDED FOR WHEEZING OR SHORTNESS OF BREATH. 09/26/23   [name], MD  ALPRAZolam  (XANAX ) 0.25 MG tablet Take 1 tablet (0.25 mg total) by mouth at bedtime. 12/14/23   Maree, Pratik D, DO  amLODipine  (NORVASC ) 10 MG tablet TAKE (1) TABLET BY MOUTH ONCE DAILY. Patient taking differently: Take 10 mg by mouth daily. 10/07/23   [name], MD  aspirin  (ASPIRIN  LOW DOSE) 81 MG chewable tablet  CHEW (1) TABLET BY MOUTH ONCE DAILY. Patient taking differently: Chew 81 mg by mouth daily. 09/06/23   [name], MD  beta carotene (BETA CAROTENE PROVITAMIN A) 25000 UNIT capsule TAKE (1) CAPSULE BY MOUTH AT BEDTIME. Patient taking differently: Take 25,000 Units by mouth at bedtime. 10/07/23   [name], MD  BISACODYL  5 MG EC tablet TAKE 1 TABLET BY MOUTH ONCE DAILY AS NEEDED FOR MODERATE CONSTIPATION. Patient taking differently: Take 5 mg by mouth daily as needed for moderate constipation. 12/20/22   [name], MD  ciprofloxacin  (CIPRO ) 500 MG tablet Take 1 tablet (500 mg total) by mouth 2 (two) times daily. 12/24/23   Cook, Jayce G, DO  cyanocobalamin  1000 MCG tablet Take 1,000 mcg by mouth at bedtime.    [provider]  cycloSPORINE  (RESTASIS ) 0.05 % ophthalmic emulsion Place 1 drop into both eyes 2 (two) times daily.    [provider]  dicyclomine  (BENTYL ) 10 MG capsule TAKE 1 CAPSULE BY MOUTH TWICE DAILY AS NEEDED FOR ABDOMINAL CRAMPS AND LOOSE STOOL. Patient taking differently: Take 10 mg by mouth 2 (two) times daily as needed (abdominal cramps and loose stool). TAKE 1 CAPSULE BY MOUTH TWICE DAILY AS NEEDED FOR ABDOMINAL CRAMPS AND LOOSE STOOL. 11/21/22   [name], MD  ferrous sulfate  (FEROSUL) 325 (65 FE) MG tablet TAKE (1) TABLET BY MOUTH EVERY OTHER DAY. Patient taking differently: Take 325  mg by mouth every other day. 10/07/23   [name], MD  glucose blood (EASYMAX TEST) test strip CHECK BLOOD SUGAR ONCE DAILY.(CALL MD IF BS BELOW 60: OR IF BS ABOVE 400) 10/07/23   Luking, Glendia LABOR, MD  hydrALAZINE  (APRESOLINE ) 50 MG tablet TAKE (1) TABLET BY MOUTH EVERY EIGHT HOURS. Patient taking differently: Take 50 mg by mouth every 8 (eight) hours. 11/03/23   Luking, Glendia LABOR, MD  IVIZIA DRY EYES 0.5 % SOLN Place 1 drop into both eyes 2 (two) times daily. 09/22/23   [provider]  loratadine  (ALLERGY RELIEF) 10 MG tablet TAKE (1) TABLET BY MOUTH ONCE  DAILY. Patient taking differently: Take 10 mg by mouth daily. 10/07/23   [name], MD  Melatonin 10 MG TABS Take 10 mg by mouth at bedtime. 09/25/23   Cook, Jayce G, DO  metFORMIN  (GLUCOPHAGE ) 500 MG tablet TAKE (1) TABLET BY MOUTH IN THE MORNING & (1/2) TABLET (250MG ) BY MOUTH AT SUPPER Patient taking differently: Take 500 mg by mouth 2 (two) times daily with a meal. TAKE (1) TABLET BY MOUTH IN THE MORNING & (1/2) TABLET (250MG ) BY MOUTH AT SUPPER 10/07/23   Luking, Glendia LABOR, MD  Multiple Vitamins-Minerals (PRESERVISION AREDS 2) CAPS Take 1 capsule by mouth in the morning and at bedtime.    [provider]  ondansetron  (ZOFRAN -ODT) 4 MG disintegrating tablet 4mg  ODT q4 hours prn nausea/vomit Patient taking differently: Take 4 mg by mouth every 4 (four) hours as needed for nausea or vomiting. 09/05/23   Zammit, Joseph, MD  pantoprazole  (PROTONIX ) 40 MG tablet Take 1 tablet (40 mg total) by mouth 2 (two) times daily before a meal. 12/24/23   Cook, Jayce G, DO  polyethylene glycol powder (GOODSENSE CLEARLAX) 17 GM/SCOOP powder 1 scoop in 8 ounces water  once daily as needed for constipation Patient taking differently: Take 17 g by mouth daily as needed for mild constipation. 1 scoop in 8 ounces water  once daily as needed for constipation 06/03/23   Luking, Glendia A, MD  sertraline  (ZOLOFT ) 25 MG tablet TAKE (1) TABLET BY MOUTH ONCE DAILY. Patient taking differently: Take 25 mg by mouth daily. 10/07/23   [name], MD  sodium chloride  1 g tablet Take 1 tablet (1 g total) by mouth daily. 10/07/23   [name], MD  valsartan  (DIOVAN ) 320 MG tablet Take 1 tablet (320 mg total) by mouth daily. 12/22/23   [name], MD  Vitamin D , Cholecalciferol , 10 MCG (400 UNIT) TABS TAKE (1) TABLET BY MOUTH ONCE DAILY. Patient taking differently: Take 1 tablet by mouth daily. 10/07/23   [name], MD    Allergies: Statins, Phenergan [promethazine hcl], Hydrocortisone, Lisinopril, Prednisone , and  Trazodone  and nefazodone    Review of Systems  Constitutional:  Positive for chills and fatigue. Negative for fever.  Respiratory:  Negative for cough.   Cardiovascular:  Negative for chest pain.  Gastrointestinal:  Positive for abdominal pain and nausea. Negative for vomiting.  Genitourinary:  Positive for dysuria.    Updated Vital Signs BP (!) 164/51 (BP Location: Left Arm)   Pulse 90   Temp 98.5 F (36.9 C) (Oral)   Resp 18   Ht 5' 8 (1.727 m)   Wt 78 kg   SpO2 96%   BMI 26.15 kg/m   Physical Exam Vitals and nursing note reviewed.  Constitutional:      General: She is not in acute distress.    Appearance: Normal appearance.  She is well-developed.  HENT:     Head: Normocephalic and atraumatic.   Eyes:     Conjunctiva/sclera: Conjunctivae normal.    Cardiovascular:     Rate and Rhythm: Normal rate and regular rhythm.     Heart sounds: No murmur heard. Pulmonary:     Effort: Pulmonary effort is normal. No respiratory distress.     Breath sounds: Normal breath sounds. No stridor. No wheezing.  Abdominal:     Palpations: Abdomen is soft.     Tenderness: There is abdominal tenderness (suprapubic). There is no guarding or rebound.   Musculoskeletal:        General: No tenderness or deformity. Normal range of motion.     Cervical back: Neck supple.   Skin:    General: Skin is warm and dry.   Neurological:     General: No focal deficit present.     Mental Status: She is alert.     GCS: GCS eye subscore is 4. GCS verbal subscore is 5. GCS motor subscore is 6.     (all labs ordered are listed, but only abnormal results are displayed) Labs Reviewed - No data to display  EKG: None  Radiology: No results found.  {Document cardiac monitor, telemetry assessment procedure when appropriate:32947} Procedures   Medications Ordered in the ED  sodium chloride  0.9 % bolus 1,000 mL (has no administration in time range)  cefTRIAXone  (ROCEPHIN ) 2 g in sodium chloride   0.9 % 100 mL IVPB (has no administration in time range)      {Click here for ABCD2, HEART and other calculators REFRESH Note before signing:1}                              Medical Decision Making Amount and/or Complexity of Data Reviewed Labs: ordered.   This patient complains of ***; this involves an extensive number of treatment Options and is a complaint that carries with it a high risk of complications and morbidity. The differential includes ***  I ordered, reviewed and interpreted labs, which included *** I ordered medication *** and reviewed PMP when indicated. I ordered imaging studies which included *** and I independently    visualized and interpreted imaging which showed *** Additional history obtained from *** Previous records obtained and reviewed *** I consulted *** and discussed lab and imaging findings and discussed disposition.  Cardiac monitoring reviewed, *** Social determinants considered, *** Critical Interventions: ***  After the interventions stated above, I reevaluated the patient and found *** Admission and further testing considered, ***   {Document critical care time when appropriate  Document review of labs and clinical decision tools ie CHADS2VASC2, etc  Document your independent review of radiology images and any outside records  Document your discussion with family members, caretakers and with consultants  Document social determinants of health affecting pt's care  Document your decision making why or why not admission, treatments were needed:32947:::1}   Final diagnoses:  None    ED Discharge Orders     None

## 2023-12-27 NOTE — ED Notes (Signed)
 Family updated as to patient's status.

## 2023-12-27 NOTE — H&P (Signed)
 History and Physical    Patient: [name] FMW:[id number] DOB: 08-19-1931 DOA: 12/27/2023 DOS: the patient was seen and examined on 12/28/2023 PCP: [name], MD  Patient coming from: ALF/ILF  Chief Complaint:  Chief Complaint  Patient presents with   Urinary Retention   HPI: [name] is a 89 y.o. female with medical history significant of hypertension, hyperlipidemia, T2DM, GERD mesenteric artery stenosis and stent placement to presents to the emergency department via EMS from long-term care facility complained of nausea and decreased appetite, she also reported lower abdominal discomfort and difficulty urinating.  She complained of chills without fever. She saw her PCP several days ago and complained of not feeling well and also complained of fatigue, decreased appetite, chills without fever and increased urinary frequency.  Urinalysis done was consistent with UTI, she was placed on Cipro  while awaiting urine culture. She was admitted from 6/14 to 6/15 due to atypical chest pain which was nonexertional in nature.  ED Course:  In the emergency department, BP was 157/55, other vital signs were within normal range.  Workup in the ED showed normocytic anemia, BMP was normal except for hyponatremia (Na 120), chloride 85, blood glucose 143.  Lactic acid was normal, urinalysis was normal.  Initial urine culture was positive for gram-negative rods, urine culture pending. She was noted to have urinary retention and Foley catheter was placed with 750 mL of urine in the Foley catheter.  Empiric IV ceftriaxone  was given.  TR[facility] was asked to admit patient.  Review of Systems: Review of systems as noted in the HPI. All other systems reviewed and are negative.   Past Medical History:  Diagnosis Date   Blood transfusion without reported diagnosis    Cognitive dysfunction 03/04/2019   Patient scores 21 out of 30 on [place] cognitive assessment September 2020   Diabetes mellitus  without complication (HCC)    Diabetic peripheral neuropathy associated with type 2 diabetes mellitus (HCC) 02/25/2022   Diverticulitis    Frailty 03/04/2019   H/O bilateral breast reduction surgery    Hyperlipidemia    a. intolerant to statins.    Hypertension    Past Surgical History:  Procedure Laterality Date   ABDOMINAL HYSTERECTOMY  2003   APPENDECTOMY     age 56   BIOPSY  04/10/2023   Procedure: BIOPSY;  Surgeon: Cinderella Deatrice FALCON, MD;  Location: [facility];  Service: Endoscopy;;   BREAST REDUCTION SURGERY     age 7   COLON SURGERY Left 2011   Hemicolectomy due to diverticulitis   ESOPHAGOGASTRODUODENOSCOPY (EGD) WITH PROPOFOL  N/A 04/10/2023   Procedure: ESOPHAGOGASTRODUODENOSCOPY (EGD) WITH PROPOFOL ;  Surgeon: Cinderella Deatrice FALCON, MD;  Location: [facility];  Service: Endoscopy;  Laterality: N/A;   EYE SURGERY  03/30/2009   cataract   IR ANGIOGRAM VISCERAL SELECTIVE  04/11/2023   IR INTRAVASCULAR ULTRASOUND NON CORONARY  04/11/2023   IR TRANSCATH PLC STENT 1ST ART NOT LE CV CAR VERT CAR  04/11/2023   IR US  GUIDE VASC ACCESS RIGHT  04/11/2023   KNEE SURGERY Right 2003   knee cap   LAPAROSCOPIC INCISIONAL / UMBILICAL / VENTRAL HERNIA REPAIR  02/23/2007   REDUCTION MAMMAPLASTY Bilateral 2001   REFRACTIVE SURGERY     TOTAL KNEE ARTHROPLASTY Right 04/10/2022   Procedure: TOTAL KNEE ARTHROPLASTY;  Surgeon: Gerome Charleston, MD;  Location: [facility];  Service: Orthopedics;  Laterality: Right;  adductor canal    Social History:  reports that she has never smoked. She  has never used smokeless tobacco. She reports that she does not drink alcohol  and does not use drugs.   Allergies  Allergen Reactions   Statins Other (See Comments)    Severe myalgias   Phenergan [Promethazine Hcl] Other (See Comments)    Hallucinations    Hydrocortisone Itching   Lisinopril Other (See Comments)    Lethargy, Fatigue   Prednisone  Rash   Trazodone  And Nefazodone Cough    Family History   Problem Relation Age of Onset   Cancer Mother        ovaian   Hypertension Father        kidney   Stroke Father    Cancer Father    Hypertension Sister    Diabetes Brother      Prior to Admission medications   Medication Sig Start Date End Date Taking? Authorizing Provider  acetaminophen  (TYLENOL ) 325 MG tablet TAKE (2) TABLETS BY MOUTH EVERY SIX HOURS AS NEEDED FOR PAIN. MAX 3GM IN 24 HOURS. Patient taking differently: Take 650 mg by mouth every 6 (six) hours as needed for mild pain (pain score 1-3). 07/28/23   [name], MD  albuterol  (VENTOLIN  HFA) 108 (90 Base) MCG/ACT inhaler INHALE 2 PUFFS INTO THE LUNGS EVERY SIX HOURS AS NEEDED FOR WHEEZING OR SHORTNESS OF BREATH. 09/26/23   [name], MD  ALPRAZolam  (XANAX ) 0.25 MG tablet Take 1 tablet (0.25 mg total) by mouth at bedtime. 12/14/23   Maree, Pratik D, DO  amLODipine  (NORVASC ) 10 MG tablet TAKE (1) TABLET BY MOUTH ONCE DAILY. Patient taking differently: Take 10 mg by mouth daily. 10/07/23   [name], MD  aspirin  (ASPIRIN  LOW DOSE) 81 MG chewable tablet CHEW (1) TABLET BY MOUTH ONCE DAILY. Patient taking differently: Chew 81 mg by mouth daily. 09/06/23   [name], MD  beta carotene (BETA CAROTENE PROVITAMIN A) 25000 UNIT capsule TAKE (1) CAPSULE BY MOUTH AT BEDTIME. Patient taking differently: Take 25,000 Units by mouth at bedtime. 10/07/23   [name], MD  BISACODYL  5 MG EC tablet TAKE 1 TABLET BY MOUTH ONCE DAILY AS NEEDED FOR MODERATE CONSTIPATION. Patient taking differently: Take 5 mg by mouth daily as needed for moderate constipation. 12/20/22   [name], MD  ciprofloxacin  (CIPRO ) 500 MG tablet Take 1 tablet (500 mg total) by mouth 2 (two) times daily. 12/24/23   Cook, Jayce G, DO  cyanocobalamin  1000 MCG tablet Take 1,000 mcg by mouth at bedtime.    [provider]  cycloSPORINE  (RESTASIS ) 0.05 % ophthalmic emulsion Place 1 drop into both eyes 2 (two) times daily.    [provider]   dicyclomine  (BENTYL ) 10 MG capsule TAKE 1 CAPSULE BY MOUTH TWICE DAILY AS NEEDED FOR ABDOMINAL CRAMPS AND LOOSE STOOL. Patient taking differently: Take 10 mg by mouth 2 (two) times daily as needed (abdominal cramps and loose stool). TAKE 1 CAPSULE BY MOUTH TWICE DAILY AS NEEDED FOR ABDOMINAL CRAMPS AND LOOSE STOOL. 11/21/22   [name], MD  ferrous sulfate  (FEROSUL) 325 (65 FE) MG tablet TAKE (1) TABLET BY MOUTH EVERY OTHER DAY. Patient taking differently: Take 325 mg by mouth every other day. 10/07/23   [name], MD  glucose blood (EASYMAX TEST) test strip CHECK BLOOD SUGAR ONCE DAILY.(CALL MD IF BS BELOW 60: OR IF BS ABOVE 400) 10/07/23   Luking, Glendia LABOR, MD  hydrALAZINE  (APRESOLINE ) 50 MG tablet TAKE (1) TABLET BY MOUTH EVERY EIGHT HOURS. Patient taking differently: Take 50  mg by mouth every 8 (eight) hours. 11/03/23   Luking, Glendia LABOR, MD  IVIZIA DRY EYES 0.5 % SOLN Place 1 drop into both eyes 2 (two) times daily. 09/22/23   [provider]  loratadine  (ALLERGY RELIEF) 10 MG tablet TAKE (1) TABLET BY MOUTH ONCE DAILY. Patient taking differently: Take 10 mg by mouth daily. 10/07/23   [name], MD  Melatonin 10 MG TABS Take 10 mg by mouth at bedtime. 09/25/23   Cook, Jayce G, DO  metFORMIN  (GLUCOPHAGE ) 500 MG tablet TAKE (1) TABLET BY MOUTH IN THE MORNING & (1/2) TABLET (250MG ) BY MOUTH AT SUPPER Patient taking differently: Take 500 mg by mouth 2 (two) times daily with a meal. TAKE (1) TABLET BY MOUTH IN THE MORNING & (1/2) TABLET (250MG ) BY MOUTH AT SUPPER 10/07/23   Luking, Glendia LABOR, MD  Multiple Vitamins-Minerals (PRESERVISION AREDS 2) CAPS Take 1 capsule by mouth in the morning and at bedtime.    [provider]  ondansetron  (ZOFRAN -ODT) 4 MG disintegrating tablet 4mg  ODT q4 hours prn nausea/vomit Patient taking differently: Take 4 mg by mouth every 4 (four) hours as needed for nausea or vomiting. 09/05/23   Zammit, Joseph, MD  pantoprazole  (PROTONIX ) 40 MG tablet Take 1  tablet (40 mg total) by mouth 2 (two) times daily before a meal. 12/24/23   Cook, Jayce G, DO  polyethylene glycol powder (GOODSENSE CLEARLAX) 17 GM/SCOOP powder 1 scoop in 8 ounces water  once daily as needed for constipation Patient taking differently: Take 17 g by mouth daily as needed for mild constipation. 1 scoop in 8 ounces water  once daily as needed for constipation 06/03/23   [name], MD  sertraline  (ZOLOFT ) 25 MG tablet TAKE (1) TABLET BY MOUTH ONCE DAILY. Patient taking differently: Take 25 mg by mouth daily. 10/07/23   [name], MD  sodium chloride  1 g tablet Take 1 tablet (1 g total) by mouth daily. 10/07/23   [name], MD  valsartan  (DIOVAN ) 320 MG tablet Take 1 tablet (320 mg total) by mouth daily. 12/22/23   [name], MD  Vitamin D , Cholecalciferol , 10 MCG (400 UNIT) TABS TAKE (1) TABLET BY MOUTH ONCE DAILY. Patient taking differently: Take 1 tablet by mouth daily. 10/07/23   [name], MD    Physical Exam: BP (!) 164/52 (BP Location: Left Arm)   Pulse 68   Temp 98.2 F (36.8 C) (Oral)   Resp (!) 22   Ht 5' 8 (1.727 m)   Wt 78.1 kg   SpO2 96%   BMI 26.18 kg/m   General: 89 y.o. year-old female well developed well nourished in no acute distress.  Alert and oriented x3. HEENT: NCAT, EOMI Neck: Supple, trachea medial Cardiovascular: Regular rate and rhythm with no rubs or gallops.  No thyromegaly or JVD noted.  No lower extremity edema. 2/4 pulses in all 4 extremities. Respiratory: Clear to auscultation with no wheezes or rales. Good inspiratory effort. Abdomen: Soft, suprapubic tenderness without guarding.  Nondistended with normal bowel sounds x4 quadrants. Muskuloskeletal: No cyanosis, clubbing or edema noted bilaterally Neuro: CN II-XII intact, strength 5/5 x 4, sensation, reflexes intact Skin: No ulcerative lesions noted or rashes Psychiatry: Judgement and insight appear normal. Mood is appropriate for condition and setting          Labs  on Admission:  Basic Metabolic Panel: Recent Labs  Lab 12/26/23 0958 12/27/23 2043 12/27/23 2224  NA 128* 120* 124*  K 4.6 4.1  --  CL 90* 85*  --   CO2 21 23  --   GLUCOSE 121* 143*  --   BUN 16 19  --   CREATININE 1.04* 0.92  --   CALCIUM 9.6 9.0  --    Liver Function Tests: No results for input(s): AST, ALT, ALKPHOS, BILITOT, PROT, ALBUMIN in the last 168 hours. No results for input(s): LIPASE, AMYLASE in the last 168 hours. No results for input(s): AMMONIA in the last 168 hours. CBC: Recent Labs  Lab 12/27/23 2043  WBC 7.7  NEUTROABS 5.3  HGB 11.2*  HCT 32.7*  MCV 86.7  PLT 323   Cardiac Enzymes: No results for input(s): CKTOTAL, CKMB, CKMBINDEX, TROPONINI in the last 168 hours.  BNP (last 3 results) Recent Labs    04/08/23 0247 07/24/23 1456 09/22/23 1848  BNP 83.0 88.0 88.0    ProBNP (last 3 results) No results for input(s): PROBNP in the last 8760 hours.  CBG: No results for input(s): GLUCAP in the last 168 hours.  Radiological Exams on Admission: No results found.  EKG: I independently viewed the EKG done and my findings are as followed: EKG was not done in the ED  Assessment/Plan Present on Admission:  UTI (urinary tract infection)  Hyponatremia  Essential hypertension  GERD (gastroesophageal reflux disease)  Generalized anxiety disorder  Mesenteric artery stenosis (HCC)  Principal Problem:   UTI (urinary tract infection) Active Problems:   Essential hypertension   Hyponatremia   Generalized anxiety disorder   GERD (gastroesophageal reflux disease)   Mesenteric artery stenosis (HCC)   Acute urinary retention   Type 2 diabetes mellitus with hyperglycemia (HCC)  UTI POA Urinary retention Patient was started on ciprofloxacin  as an outpatient for UTI.  Outpatient urine culture resulted was positive for gram-negative rods.  Presented to the ED due to ongoing symptoms with suprapubic tenderness. She was  started on IV ceftriaxone .  Continue IV ceftriaxone  pending culture sensitivity Foley catheter was placed due to urinary retention  Hyponatremia Na 120 > 124 Continue gentle hydration Continue to monitor sodium with serial BMPs Urine osmolality, serum osmolality and urine sodium will be checked  T2DM with hyperglycemia Hemoglobin A1c on 12/26/2023 was 6.3 Continue ISS and hypoglycemia protocol  Essential hypertension Continue amlodipine , Avapro , hydralazine   GERD Continue Protonix    Generalized anxiety disorder Continue sertraline , Xanax     Mesenteric artery stenosis (HCC) History of mesenteric artery stent, continue aspirin     DVT prophylaxis: Lovenox   Code Status: DNR  Family Communication: None at bedside  Consults: None  Severity of Illness: The appropriate patient status for this patient is INPATIENT. Inpatient status is judged to be reasonable and necessary in order to provide the required intensity of service to ensure the patient's safety. The patient's presenting symptoms, physical exam findings, and initial radiographic and laboratory data in the context of their chronic comorbidities is felt to place them at high risk for further clinical deterioration. Furthermore, it is not anticipated that the patient will be medically stable for discharge from the hospital within 2 midnights of admission.   * I certify that at the point of admission it is my clinical judgment that the patient will require inpatient hospital care spanning beyond 2 midnights from the point of admission due to high intensity of service, high risk for further deterioration and high frequency of surveillance required.*  Author: Temesgen Weightman, DO 12/28/2023 12:18 AM  For on call review [email or web address].

## 2023-12-28 DIAGNOSIS — R338 Other retention of urine: Secondary | ICD-10-CM | POA: Insufficient documentation

## 2023-12-28 DIAGNOSIS — N3000 Acute cystitis without hematuria: Secondary | ICD-10-CM | POA: Diagnosis not present

## 2023-12-28 DIAGNOSIS — E1165 Type 2 diabetes mellitus with hyperglycemia: Secondary | ICD-10-CM | POA: Insufficient documentation

## 2023-12-28 LAB — CBC
HCT: 30.4 % — ABNORMAL LOW (ref 36.0–46.0)
Hemoglobin: 9.9 g/dL — ABNORMAL LOW (ref 12.0–15.0)
MCH: 28.9 pg (ref 26.0–34.0)
MCHC: 32.6 g/dL (ref 30.0–36.0)
MCV: 88.6 fL (ref 80.0–100.0)
Platelets: 283 10*3/uL (ref 150–400)
RBC: 3.43 MIL/uL — ABNORMAL LOW (ref 3.87–5.11)
RDW: 12.9 % (ref 11.5–15.5)
WBC: 6.2 10*3/uL (ref 4.0–10.5)
nRBC: 0 % (ref 0.0–0.2)

## 2023-12-28 LAB — URINE CULTURE

## 2023-12-28 LAB — COMPREHENSIVE METABOLIC PANEL WITH GFR
ALT: 11 U/L (ref 0–44)
AST: 15 U/L (ref 15–41)
Albumin: 3 g/dL — ABNORMAL LOW (ref 3.5–5.0)
Alkaline Phosphatase: 66 U/L (ref 38–126)
Anion gap: 8 (ref 5–15)
BUN: 15 mg/dL (ref 8–23)
CO2: 23 mmol/L (ref 22–32)
Calcium: 8.3 mg/dL — ABNORMAL LOW (ref 8.9–10.3)
Chloride: 94 mmol/L — ABNORMAL LOW (ref 98–111)
Creatinine, Ser: 0.78 mg/dL (ref 0.44–1.00)
GFR, Estimated: >60 mL/min (ref >60)
Glucose, Bld: 116 mg/dL — ABNORMAL HIGH (ref 70–99)
Potassium: 3.6 mmol/L (ref 3.5–5.1)
Sodium: 125 mmol/L — ABNORMAL LOW (ref 135–145)
Total Bilirubin: 0.6 mg/dL (ref 0.0–1.2)
Total Protein: 5.5 g/dL — ABNORMAL LOW (ref 6.5–8.1)

## 2023-12-28 LAB — SODIUM, URINE, RANDOM: Sodium, Ur: 36 mmol/L

## 2023-12-28 LAB — OSMOLALITY: Osmolality: 273 mosm/kg — ABNORMAL LOW (ref 275–295)

## 2023-12-28 LAB — MAGNESIUM: Magnesium: 1.6 mg/dL — ABNORMAL LOW (ref 1.7–2.4)

## 2023-12-28 LAB — GLUCOSE, CAPILLARY
Glucose-Capillary: 129 mg/dL — ABNORMAL HIGH (ref 70–99)
Glucose-Capillary: 141 mg/dL — ABNORMAL HIGH (ref 70–99)
Glucose-Capillary: 141 mg/dL — ABNORMAL HIGH (ref 70–99)
Glucose-Capillary: 128 mg/dL — ABNORMAL HIGH (ref 70–99)

## 2023-12-28 LAB — SPECIMEN STATUS REPORT

## 2023-12-28 LAB — PHOSPHORUS: Phosphorus: 3.3 mg/dL (ref 2.5–4.6)

## 2023-12-28 LAB — OSMOLALITY, URINE: Osmolality, Ur: 140 mosm/kg — ABNORMAL LOW (ref 300–900)

## 2023-12-28 LAB — TSH: TSH: 1.313 u[IU]/mL (ref 0.350–4.500)

## 2023-12-28 MED ORDER — PANTOPRAZOLE SODIUM 40 MG PO TBEC
40.0000 mg | DELAYED_RELEASE_TABLET | Freq: Two times a day (BID) | ORAL | Status: DC
  Administered 2023-12-28 – 2023-12-29 (×3): 40 mg via ORAL
  Filled 2023-12-28 (×3): qty 1

## 2023-12-28 MED ORDER — SODIUM CHLORIDE 0.9 % IV SOLN: INTRAVENOUS | Status: AC

## 2023-12-28 MED ORDER — CYCLOSPORINE 0.05 % OP EMUL
1.0000 [drp] | Freq: Two times a day (BID) | OPHTHALMIC | Status: DC
  Administered 2023-12-28 – 2023-12-29 (×3): 1 [drp] via OPHTHALMIC
  Filled 2023-12-28 (×2): qty 30

## 2023-12-28 MED ORDER — SODIUM CHLORIDE 1 G PO TABS
1.0000 g | ORAL_TABLET | Freq: Every day | ORAL | Status: DC
  Administered 2023-12-28: 1 g via ORAL
  Filled 2023-12-28: qty 1

## 2023-12-28 MED ORDER — INSULIN ASPART 100 UNIT/ML IJ SOLN
0.0000 [IU] | Freq: Three times a day (TID) | INTRAMUSCULAR | Status: DC
  Administered 2023-12-28 – 2023-12-29 (×4): 1 [IU] via SUBCUTANEOUS

## 2023-12-28 MED ORDER — ENOXAPARIN SODIUM 40 MG/0.4ML IJ SOSY
40.0000 mg | PREFILLED_SYRINGE | INTRAMUSCULAR | Status: DC
  Administered 2023-12-28 – 2023-12-29 (×2): 40 mg via SUBCUTANEOUS
  Filled 2023-12-28 (×2): qty 0.4

## 2023-12-28 MED ORDER — HYDRALAZINE HCL 25 MG PO TABS
50.0000 mg | ORAL_TABLET | Freq: Three times a day (TID) | ORAL | Status: DC
  Administered 2023-12-28 – 2023-12-29 (×5): 50 mg via ORAL
  Filled 2023-12-28 (×5): qty 2

## 2023-12-28 MED ORDER — SERTRALINE HCL 50 MG PO TABS
25.0000 mg | ORAL_TABLET | Freq: Every day | ORAL | Status: DC
  Administered 2023-12-28 – 2023-12-29 (×2): 25 mg via ORAL
  Filled 2023-12-28 (×2): qty 1

## 2023-12-28 MED ORDER — ONDANSETRON HCL 4 MG PO TABS: 4.0000 mg | ORAL_TABLET | Freq: Four times a day (QID) | ORAL | Status: DC | PRN

## 2023-12-28 MED ORDER — ACETAMINOPHEN 650 MG RE SUPP: 650.0000 mg | Freq: Four times a day (QID) | RECTAL | Status: DC | PRN

## 2023-12-28 MED ORDER — ASPIRIN 81 MG PO CHEW
81.0000 mg | CHEWABLE_TABLET | Freq: Every day | ORAL | Status: DC
  Administered 2023-12-28 – 2023-12-29 (×2): 81 mg via ORAL
  Filled 2023-12-28 (×2): qty 1

## 2023-12-28 MED ORDER — ALPRAZOLAM 0.25 MG PO TABS
0.2500 mg | ORAL_TABLET | Freq: Every day | ORAL | Status: DC
  Administered 2023-12-28 (×2): 0.25 mg via ORAL
  Filled 2023-12-28 (×2): qty 1

## 2023-12-28 MED ORDER — CHLORHEXIDINE GLUCONATE CLOTH 2 % EX PADS
6.0000 | MEDICATED_PAD | Freq: Every day | CUTANEOUS | Status: DC
  Administered 2023-12-28: 6 via TOPICAL

## 2023-12-28 MED ORDER — MAGNESIUM SULFATE 2 GM/50ML IV SOLN
2.0000 g | Freq: Once | INTRAVENOUS | Status: AC
  Administered 2023-12-28: 2 g via INTRAVENOUS
  Filled 2023-12-28: qty 50

## 2023-12-28 MED ORDER — CIPROFLOXACIN IN D5W 400 MG/200ML IV SOLN
400.0000 mg | Freq: Two times a day (BID) | INTRAVENOUS | Status: DC
  Administered 2023-12-28 – 2023-12-29 (×2): 400 mg via INTRAVENOUS
  Filled 2023-12-28 (×2): qty 200

## 2023-12-28 MED ORDER — SODIUM CHLORIDE 1 G PO TABS
1.0000 g | ORAL_TABLET | Freq: Three times a day (TID) | ORAL | Status: DC
  Administered 2023-12-28 – 2023-12-29 (×3): 1 g via ORAL
  Filled 2023-12-28 (×3): qty 1

## 2023-12-28 MED ORDER — AMLODIPINE BESYLATE 5 MG PO TABS
10.0000 mg | ORAL_TABLET | Freq: Every day | ORAL | Status: DC
  Administered 2023-12-28 – 2023-12-29 (×2): 10 mg via ORAL
  Filled 2023-12-28 (×2): qty 2

## 2023-12-28 MED ORDER — ACETAMINOPHEN 325 MG PO TABS: 650.0000 mg | ORAL_TABLET | Freq: Four times a day (QID) | ORAL | Status: DC | PRN

## 2023-12-28 MED ORDER — IRBESARTAN 150 MG PO TABS
300.0000 mg | ORAL_TABLET | Freq: Every day | ORAL | Status: DC
  Administered 2023-12-28 – 2023-12-29 (×2): 300 mg via ORAL
  Filled 2023-12-28 (×2): qty 2

## 2023-12-28 MED ORDER — ONDANSETRON HCL 4 MG/2ML IJ SOLN: 4.0000 mg | Freq: Four times a day (QID) | INTRAMUSCULAR | Status: DC | PRN

## 2023-12-28 NOTE — Plan of Care (Signed)

## 2023-12-28 NOTE — Progress Notes (Signed)
 PROGRESS NOTE    [name]  FMW:[id number] DOB: 02-Jul-1931 DOA: 12/27/2023 PCP: [name], MD   Brief Narrative:    [name] is a 89 y.o. female with medical history significant of hypertension, hyperlipidemia, T2DM, GERD mesenteric artery stenosis and stent placement to presents to the emergency department via EMS from long-term care facility complained of nausea and decreased appetite, she also reported lower abdominal discomfort and difficulty urinating.  She complained of chills without fever. She appears to have some symptomatic UTI as well as hyponatremia.  Assessment & Plan:   Principal Problem:   UTI (urinary tract infection) Active Problems:   Essential hypertension   Hyponatremia   Generalized anxiety disorder   GERD (gastroesophageal reflux disease)   Mesenteric artery stenosis (HCC)   Acute urinary retention   Type 2 diabetes mellitus with hyperglycemia (HCC)  Assessment and Plan:   UTI POA-GNR Urinary retention Patient was started on ciprofloxacin  as an outpatient for UTI.  Outpatient urine culture resulted was positive for gram-negative rods.  Presented to the ED due to ongoing symptoms with suprapubic tenderness. Change to ciprofloxacin  based on sensitivities in the past with Klebsiella Foley catheter was placed due to urinary retention   Hyponatremia-symptomatic, improving Na 120 > 125 Continue gentle hydration Continue to monitor sodium with serial BMPs Urine osmolality, serum osmolality and urine sodium will be checked TSH 1.3   T2DM with hyperglycemia Hemoglobin A1c on 12/26/2023 was 6.3 Continue ISS and hypoglycemia protocol   Essential hypertension Continue amlodipine , Avapro , hydralazine    GERD Continue Protonix     Generalized anxiety disorder Continue sertraline , Xanax     Mesenteric artery stenosis (HCC) History of mesenteric artery stent, continue aspirin      DVT prophylaxis:Lovenox  Code Status: DNR Family  Communication: Discussed with son on the phone 6/29 Disposition Plan:  Status is: Inpatient Remains inpatient appropriate because: Need for IV medications.  Consultants:  Palliative  Procedures:  None  Antimicrobials:  Anti-infectives (From admission, onward)    Start     Dose/Rate Route Frequency Ordered Stop   12/28/23 1145  ciprofloxacin  (CIPRO ) IVPB 400 mg        400 mg 200 mL/hr over 60 Minutes Intravenous Every 12 hours 12/28/23 1050     12/28/23 1000  cefTRIAXone  (ROCEPHIN ) 1 g in sodium chloride  0.9 % 100 mL IVPB  Status:  Discontinued        1 g 200 mL/hr over 30 Minutes Intravenous Every 24 hours 12/27/23 2355 12/28/23 1050   12/27/23 2030  cefTRIAXone  (ROCEPHIN ) 2 g in sodium chloride  0.9 % 100 mL IVPB        2 g 200 mL/hr over 30 Minutes Intravenous  Once 12/27/23 2029 12/27/23 2122       Subjective: Patient seen and evaluated today with no new acute complaints or concerns. No acute concerns or events noted overnight.  Objective: Vitals:   12/28/23 0012 12/28/23 0318 12/28/23 0807 12/28/23 0942  BP:  (!) 123/53 (!) 152/48 (!) 142/60  Pulse:  65 66 79  Resp:  16 16 16   Temp:  98.5 F (36.9 C) 98.2 F (36.8 C)   TempSrc:  Oral Oral   SpO2: 96% 95% 95% 99%  Weight:      Height:        Intake/Output Summary (Last 24 hours) at 12/28/2023 1052 Last data filed at 12/28/2023 0900 Gross per 24 hour  Intake 1735.67 ml  Output 1275 ml  Net 460.67 ml   [employer]  12/27/23 2010 12/27/23 2358  Weight: 78 kg 78.1 kg    Examination:  General exam: Appears calm and comfortable  Respiratory system: Clear to auscultation. Respiratory effort normal. Cardiovascular system: S1 & S2 heard, RRR.  Gastrointestinal system: Abdomen is soft Central nervous system: Alert and awake Extremities: No edema Skin: No significant lesions noted Psychiatry: Flat affect.    Data Reviewed: I have personally reviewed following labs and imaging studies  CBC: Recent Labs   Lab 12/27/23 2043 12/28/23 0239  WBC 7.7 6.2  NEUTROABS 5.3  --   HGB 11.2* 9.9*  HCT 32.7* 30.4*  MCV 86.7 88.6  PLT 323 283   Basic Metabolic Panel: Recent Labs  Lab 12/26/23 0958 12/27/23 2043 12/27/23 2224 12/28/23 0239  NA 128* 120* 124* 125*  K 4.6 4.1  --  3.6  CL 90* 85*  --  94*  CO2 21 23  --  23  GLUCOSE 121* 143*  --  116*  BUN 16 19  --  15  CREATININE 1.04* 0.92  --  0.78  CALCIUM 9.6 9.0  --  8.3*  MG  --   --   --  1.6*  PHOS  --   --   --  3.3   GFR: Estimated Creatinine Clearance: 51.4 mL/min (by C-G formula based on SCr of 0.78 mg/dL). Liver Function Tests: Recent Labs  Lab 12/28/23 0239  AST 15  ALT 11  ALKPHOS 66  BILITOT 0.6  PROT 5.5*  ALBUMIN 3.0*   No results for input(s): LIPASE, AMYLASE in the last 168 hours. No results for input(s): AMMONIA in the last 168 hours. Coagulation Profile: No results for input(s): INR, PROTIME in the last 168 hours. Cardiac Enzymes: No results for input(s): CKTOTAL, CKMB, CKMBINDEX, TROPONINI in the last 168 hours. BNP (last 3 results) No results for input(s): PROBNP in the last 8760 hours. HbA1C: Recent Labs    12/26/23 0958  HGBA1C 6.3*   CBG: Recent Labs  Lab 12/28/23 0805  GLUCAP 129*   Lipid Profile: No results for input(s): CHOL, HDL, LDLCALC, TRIG, CHOLHDL, LDLDIRECT in the last 72 hours. Thyroid  Function Tests: Recent Labs    12/28/23 0716  TSH 1.313   Anemia Panel: No results for input(s): VITAMINB12, FOLATE, FERRITIN, TIBC, IRON , RETICCTPCT in the last 72 hours. Sepsis Labs: Recent Labs  Lab 12/27/23 2043 12/27/23 2224  LATICACIDVEN 1.5 0.8    Recent Results (from the past 240 hours)  Urine Culture     Status: Abnormal (Preliminary result)   Collection Time: 12/24/23 12:00 AM   Specimen: Urine   Urine  Result Value Ref Range Status   Urine Culture, Routine Preliminary report (A)  Preliminary   Organism ID, Bacteria Gram  negative rods (A)  Preliminary    Comment: Greater than 100,000 colony forming units per mL   ORGANISM ID, BACTERIA Gram negative rods (A)  Preliminary    Comment: 10,000-25,000 colony forming units per mL  Culture, blood (routine x 2)     Status: None (Preliminary result)   Collection Time: 12/27/23  8:40 PM   Specimen: Left Antecubital; Blood  Result Value Ref Range Status   Specimen Description LEFT ANTECUBITAL  Final   Special Requests   Final    BOTTLES DRAWN AEROBIC AND ANAEROBIC Blood Culture adequate volume   Culture   Final    NO GROWTH < 12 HOURS Performed at [facility], [address], [place], [place] [address]    Report Status PENDING  Incomplete  Culture, blood (routine x 2)     Status: None (Preliminary result)   Collection Time: 12/27/23  8:43 PM   Specimen: Right Antecubital; Blood  Result Value Ref Range Status   Specimen Description RIGHT ANTECUBITAL  Final   Special Requests   Final    BOTTLES DRAWN AEROBIC ONLY Blood Culture adequate volume   Culture   Final    NO GROWTH < 12 HOURS Performed at [facility], [address], [place], [place] [address]    Report Status PENDING  Incomplete         Radiology Studies: No results found.      Scheduled Meds:  ALPRAZolam   0.25 mg Oral QHS   amLODipine   10 mg Oral Daily   aspirin   81 mg Oral Daily   Chlorhexidine  Gluconate Cloth  6 each Topical Daily   cycloSPORINE   1 drop Both Eyes BID   enoxaparin  (LOVENOX ) injection  40 mg Subcutaneous Q24H   hydrALAZINE   50 mg Oral Q8H   insulin  aspart  0-9 Units Subcutaneous TID WC   irbesartan   300 mg Oral Daily   pantoprazole   40 mg Oral BID AC   sertraline   25 mg Oral Daily   sodium chloride   1 g Oral TID WC   Continuous Infusions:  sodium chloride  50 mL/hr at 12/28/23 0931   ciprofloxacin        LOS: 1 day    Time spent: 55 minutes    Magen Suriano JONETTA Fairly, DO [facility]  If 7PM-7AM, please contact night-coverage [email or web address] 12/28/2023,  10:52 AM

## 2023-12-28 NOTE — TOC Initial Note (Signed)
 Transition of Care [facility]) - Initial/Assessment Note    Patient Details  Name: [name] MRN: [id number] Date of Birth: [birth year]  Transition of Care [facility]) CM/SW Contact:    Nena LITTIE Coffee, RN Phone Number: 12/28/2023, 5:45 PM  Clinical Narrative:                 Pt from [facility] and plans to return their at dc. Pt states she is currently active c/PT, OT but does not know agency.   Expected Discharge Plan: Assisted Living Barriers to Discharge: Continued Medical Work up   Patient Goals and CMS Choice Patient states their goals for this hospitalization and ongoing recovery are:: Return to [facility]   Expected Discharge Plan and Services In-house Referral: Clinical Social Work     Living arrangements for the past 2 months: Assisted Living Facility                   Prior Living Arrangements/Services Living arrangements for the past 2 months: Assisted Living Facility Lives with:: Facility Resident Patient language and need for interpreter reviewed:: Yes Do you feel safe going back to the place where you live?: Yes      Need for Family Participation in Patient Care: No (Comment) Care giver support system in place?: Yes (comment) Current home services: DME (walker) Criminal Activity/Legal Involvement Pertinent to Current Situation/Hospitalization: No - Comment as needed  Activities of Daily Living   ADL Screening (condition at time of admission) Independently performs ADLs?: Yes (appropriate for developmental age) Is the patient deaf or have difficulty hearing?: No Does the patient have difficulty seeing, even when wearing glasses/contacts?: No Does the patient have difficulty concentrating, remembering, or making decisions?: No  Permission Sought/Granted   Emotional Assessment   Attitude/Demeanor/Rapport: Engaged Affect (typically observed): Appropriate, Calm Orientation: : Oriented to Self, Oriented to Place, Oriented to  Time, Oriented to  Situation Alcohol  / Substance Use: Not Applicable Psych Involvement: No (comment)  Admission diagnosis:  UTI (urinary tract infection) [N39.0] Patient Active Problem List   Diagnosis Date Noted   Acute urinary retention 12/28/2023   Type 2 diabetes mellitus with hyperglycemia (HCC) 12/28/2023   UTI (urinary tract infection) 12/27/2023   Frequency of urination 12/24/2023   Acute blood loss anemia 04/17/2023   Gastritis and gastroduodenitis 04/10/2023   Abdominal pain, chronic, epigastric 04/08/2023   Mesenteric artery stenosis (HCC) 04/07/2023   Diabetic neuropathy (HCC) 11/30/2022   S/P total knee arthroplasty, right 04/12/2022   Aortic atherosclerosis (HCC) 02/25/2022   Chronic non-seasonal allergic rhinitis 02/25/2022   GERD (gastroesophageal reflux disease) 02/17/2022   Myalgia due to statin 02/15/2020   Cognitive dysfunction 03/04/2019   Generalized anxiety disorder 06/05/2018   Hyponatremia 03/08/2018   Osteoarthritis of right knee 12/02/2017   Hyperlipidemia associated with type 2 diabetes mellitus (HCC) 10/02/2016   Insomnia 10/02/2016   Essential hypertension 12/15/2014   Type 2 diabetes mellitus with atherosclerosis of aorta (HCC) 02/15/2011   PCP:  [name], MD Pharmacy:   [facility], [place] - [address] [place] [address] Phone: [phone] Fax: [phone]  Social Drivers of Health (SDOH) Social History: SDOH Screenings   Food Insecurity: No Food Insecurity (12/27/2023)  Housing: Low Risk  (12/27/2023)  Transportation Needs: No Transportation Needs (12/27/2023)  Utilities: Not At Risk (12/27/2023)  Alcohol  Screen: Low Risk  (09/06/2022)  Depression (PHQ2-9): Low Risk  (12/24/2023)  Recent Concern: Depression (PHQ2-9) - Medium Risk (09/25/2023)  Financial Resource Strain:  Low Risk  (09/06/2022)  Physical Activity: Sufficiently Active (09/06/2022)  Social Connections: Moderately Isolated (12/27/2023)  Stress: No Stress Concern  Present (09/06/2022)  Tobacco Use: Low Risk  (12/27/2023)   SDOH Interventions:   Readmission Risk Interventions    12/28/2023    5:41 PM 09/15/2023    3:04 PM 04/08/2023    2:11 PM  Readmission Risk Prevention Plan  Transportation Screening Complete Complete Complete  PCP or Specialist Appt within 3-5 Days   Not Complete  HRI or Home Care Consult  Complete Complete  Social Work Consult for Recovery Care Planning/Counseling  Complete Complete  Palliative Care Screening  Not Applicable Not Applicable  Medication Review Oceanographer) Complete Complete Complete  PCP or Specialist appointment within 3-5 days of discharge Complete    HRI or Home Care Consult Complete    SW Recovery Care/Counseling Consult Complete    Palliative Care Screening Not Applicable    Skilled Nursing Facility Not Applicable

## 2023-12-28 NOTE — Progress Notes (Signed)
 Pt has had very good day. Has been up in recliner most of the day and has ambulated unassisted in hallway using FWW x3 today, each time logging over 400 steps per pt. Pt states that she walks everyday and she can't stand laying in the bed. Foley cath remains intact with clear yellow urine draining. Pt states still some fullness feeling in suprapubic area but no distention noted. Has eaten well today, tolerating food and fluids without n/v. Pt aware of fluid restriction due to low sodium levels. No c/o pain today. IVF infusing without issue.

## 2023-12-29 DIAGNOSIS — N3000 Acute cystitis without hematuria: Secondary | ICD-10-CM | POA: Diagnosis not present

## 2023-12-29 LAB — BASIC METABOLIC PANEL WITH GFR
Anion gap: 8 (ref 5–15)
BUN: 15 mg/dL (ref 8–23)
CO2: 26 mmol/L (ref 22–32)
Calcium: 8.6 mg/dL — ABNORMAL LOW (ref 8.9–10.3)
Chloride: 99 mmol/L (ref 98–111)
Creatinine, Ser: 0.81 mg/dL (ref 0.44–1.00)
GFR, Estimated: >60 mL/min (ref >60)
Glucose, Bld: 116 mg/dL — ABNORMAL HIGH (ref 70–99)
Potassium: 4 mmol/L (ref 3.5–5.1)
Sodium: 133 mmol/L — ABNORMAL LOW (ref 135–145)

## 2023-12-29 LAB — CBC
HCT: 32.8 % — ABNORMAL LOW (ref 36.0–46.0)
Hemoglobin: 10.9 g/dL — ABNORMAL LOW (ref 12.0–15.0)
MCH: 29.6 pg (ref 26.0–34.0)
MCHC: 33.2 g/dL (ref 30.0–36.0)
MCV: 89.1 fL (ref 80.0–100.0)
Platelets: 305 10*3/uL (ref 150–400)
RBC: 3.68 MIL/uL — ABNORMAL LOW (ref 3.87–5.11)
RDW: 13.1 % (ref 11.5–15.5)
WBC: 6.1 10*3/uL (ref 4.0–10.5)
nRBC: 0 % (ref 0.0–0.2)

## 2023-12-29 LAB — GLUCOSE, CAPILLARY
Glucose-Capillary: 141 mg/dL — ABNORMAL HIGH (ref 70–99)
Glucose-Capillary: 140 mg/dL — ABNORMAL HIGH (ref 70–99)

## 2023-12-29 LAB — MAGNESIUM: Magnesium: 1.9 mg/dL (ref 1.7–2.4)

## 2023-12-29 MED ORDER — CIPROFLOXACIN HCL 500 MG PO TABS: 500.0000 mg | ORAL_TABLET | Freq: Two times a day (BID) | ORAL | 0 refills | Status: AC

## 2023-12-29 MED ORDER — ALPRAZOLAM 0.25 MG PO TABS: 0.2500 mg | ORAL_TABLET | Freq: Every day | ORAL | 0 refills | Status: DC

## 2023-12-29 MED ORDER — SODIUM CHLORIDE 1 G PO TABS: 1.0000 g | ORAL_TABLET | Freq: Two times a day (BID) | ORAL | 1 refills | Status: DC

## 2023-12-29 MED ORDER — TAMSULOSIN HCL 0.4 MG PO CAPS: 0.4000 mg | ORAL_CAPSULE | Freq: Every day | ORAL | 2 refills | Status: DC

## 2023-12-29 MED ORDER — SULFAMETHOXAZOLE-TRIMETHOPRIM 800-160 MG PO TABS: 1.0000 | ORAL_TABLET | Freq: Every day | ORAL | 2 refills | Status: DC

## 2023-12-29 MED ORDER — TAMSULOSIN HCL 0.4 MG PO CAPS
0.4000 mg | ORAL_CAPSULE | Freq: Every day | ORAL | Status: DC
  Administered 2023-12-29: 0.4 mg via ORAL
  Filled 2023-12-29: qty 1

## 2023-12-29 NOTE — TOC Transition Note (Signed)
 Transition of Care [facility]) - Discharge Note   Patient Details  Name: [name] MRN: [id number] Date of Birth: 09-22-1931  Transition of Care [facility]) CM/SW Contact:  Lucie Lunger, LCSWA Phone Number: 12/29/2023, 11:53 AM   Clinical Narrative:    CSW updated that pt is medically stable for D/C back to [facility]. D/C clinicals sent to facility for review. CSW spoke to [place] with [facility] who states that they will review paperwork and update once ready to accept back. Facility will be able to transport pt. CSW spoke with pts son who states he is understanding and requested AVS be sent to him. CSW sent AVS securely. TOC signing off.   Final next level of care: Assisted Living Barriers to Discharge: Barriers Resolved   Patient Goals and CMS Choice Patient states their goals for this hospitalization and ongoing recovery are:: Return to [facility].gov Compare Post Acute Care list provided to:: Patient Choice offered to / list presented to : Patient      Discharge Placement                Patient to be transferred to facility by: Facility staff Name of family member notified: Son updated Patient and family notified of of transfer: 12/29/23  Discharge Plan and Services Additional resources added to the After Visit Summary for   In-house Referral: Clinical Social Work                                   Social Drivers of Health (SDOH) Interventions SDOH Screenings   Food Insecurity: No Food Insecurity (12/27/2023)  Housing: Low Risk  (12/27/2023)  Transportation Needs: No Transportation Needs (12/27/2023)  Utilities: Not At Risk (12/27/2023)  Alcohol  Screen: Low Risk  (09/06/2022)  Depression (PHQ2-9): Low Risk  (12/24/2023)  Recent Concern: Depression (PHQ2-9) - Medium Risk (09/25/2023)  Financial Resource Strain: Low Risk  (09/06/2022)  Physical Activity: Sufficiently Active (09/06/2022)  Social Connections: Moderately Isolated (12/27/2023)  Stress: No Stress  Concern Present (09/06/2022)  Tobacco Use: Low Risk  (12/27/2023)     Readmission Risk Interventions    12/28/2023    5:41 PM 09/15/2023    3:04 PM 04/08/2023    2:11 PM  Readmission Risk Prevention Plan  Transportation Screening Complete Complete Complete  PCP or Specialist Appt within 3-5 Days   Not Complete  HRI or Home Care Consult  Complete Complete  Social Work Consult for Recovery Care Planning/Counseling  Complete Complete  Palliative Care Screening  Not Applicable Not Applicable  Medication Review Oceanographer) Complete Complete Complete  PCP or Specialist appointment within 3-5 days of discharge Complete    HRI or Home Care Consult Complete    SW Recovery Care/Counseling Consult Complete    Palliative Care Screening Not Applicable    Skilled Nursing Facility Not Applicable

## 2023-12-29 NOTE — Progress Notes (Signed)
 Mobility Specialist Progress Note:    12/29/23 1000  Mobility  Activity Ambulated with assistance in hallway;Transferred from bed to chair  Level of Assistance Modified independent, requires aide device or extra time  Assistive Device Front wheel walker  Distance Ambulated (ft) 200 ft  Range of Motion/Exercises Active;All extremities  Activity Response Tolerated well  Mobility Referral Yes  Mobility visit 1 Mobility  Mobility Specialist Start Time (ACUTE ONLY) 0940  Mobility Specialist Stop Time (ACUTE ONLY) 1000  Mobility Specialist Time Calculation (min) (ACUTE ONLY) 20 min   Pt received in bed, agreeable to mobility. ModI to stand nd ambulate with RW. Tolerated well, asx throughout. Left pt in chair, all needs met.   Sherrilee Ditty Mobility Specialist Please contact via Special educational needs teacher or  Rehab office at [phone]

## 2023-12-29 NOTE — Care Management Important Message (Signed)
 Important Message  Patient Details  Name: [name] MRN: [id number] Date of Birth: Jan 15, 1932   Important Message Given:  N/A - LOS <3 / Initial given by admissions     Ankith Edmonston L Batoul Limes 12/29/2023, 11:27 AM

## 2023-12-29 NOTE — [place] FL2 (Signed)
 [place]  MEDICAID FL2 LEVEL OF CARE FORM     IDENTIFICATION  Patient Name: [name] Birthdate: March 29, 1932 Sex: female Admission Date (Current Location): 12/27/2023  [facility] and [place] Number:  [employer] and Address:  [facility],  [address] [place] [address]      Provider Number: [id number]  Attending Physician Name and Address:  Maree Adron BIRCH, DO  Relative Name and Phone Number:       Current Level of Care: Hospital Recommended Level of Care: Assisted Living Facility Prior Approval Number:    Date Approved/Denied:   PASRR Number:    Discharge Plan: Other (Comment)    Current Diagnoses: Patient Active Problem List   Diagnosis Date Noted   Acute urinary retention 12/28/2023   Type 2 diabetes mellitus with hyperglycemia (HCC) 12/28/2023   UTI (urinary tract infection) 12/27/2023   Frequency of urination 12/24/2023   Acute blood loss anemia 04/17/2023   Gastritis and gastroduodenitis 04/10/2023   Abdominal pain, chronic, epigastric 04/08/2023   Mesenteric artery stenosis (HCC) 04/07/2023   Diabetic neuropathy (HCC) 11/30/2022   S/P total knee arthroplasty, right 04/12/2022   Aortic atherosclerosis (HCC) 02/25/2022   Chronic non-seasonal allergic rhinitis 02/25/2022   GERD (gastroesophageal reflux disease) 02/17/2022   Myalgia due to statin 02/15/2020   Cognitive dysfunction 03/04/2019   Generalized anxiety disorder 06/05/2018   Hyponatremia 03/08/2018   Osteoarthritis of right knee 12/02/2017   Hyperlipidemia associated with type 2 diabetes mellitus (HCC) 10/02/2016   Insomnia 10/02/2016   Essential hypertension 12/15/2014   Type 2 diabetes mellitus with atherosclerosis of aorta (HCC) 02/15/2011    Orientation RESPIRATION BLADDER Height & Weight     Self, Time, Situation, Place  Normal Continent Weight: 172 lb 2.9 oz (78.1 kg) Height:  5' 8 (172.7 cm)  BEHAVIORAL SYMPTOMS/MOOD NEUROLOGICAL BOWEL NUTRITION STATUS       Continent Diet (reduced concentrated sweets, no added table salt)  AMBULATORY STATUS COMMUNICATION OF NEEDS Skin   Limited Assist Verbally Normal                       Personal Care Assistance Level of Assistance  Bathing, Feeding, Dressing Bathing Assistance: Limited assistance Feeding assistance: Independent Dressing Assistance: Limited assistance     Functional Limitations Info  Sight, Hearing, Speech Sight Info: Impaired Hearing Info: Adequate Speech Info: Adequate    SPECIAL CARE FACTORS FREQUENCY                       Contractures Contractures Info: Not present    Additional Factors Info  Code Status, Allergies Code Status Info: DNR- Limited Allergies Info: Hydrocortisone, Lisinopril, Phenergan (Promethazine Hcl), Statins, Trazodone  And Nefazodone, Prednisone            Current Medications (12/29/2023):  This is the current hospital active medication list Current Facility-Administered Medications  Medication Dose Route Frequency Provider Last Rate Last Admin   acetaminophen  (TYLENOL ) tablet 650 mg  650 mg Oral Q6H PRN Adefeso, Oladapo, DO       Or   acetaminophen  (TYLENOL ) suppository 650 mg  650 mg Rectal Q6H PRN Adefeso, Oladapo, DO       ALPRAZolam  (XANAX ) tablet 0.25 mg  0.25 mg Oral QHS Adefeso, Oladapo, DO   0.25 mg at 12/28/23 2113   amLODipine  (NORVASC ) tablet 10 mg  10 mg Oral Daily Adefeso, Oladapo, DO   10 mg at 12/29/23 0846   aspirin  chewable tablet 81 mg  81 mg Oral Daily Adefeso, Oladapo, DO   81 mg at 12/29/23 0847   Chlorhexidine  Gluconate Cloth 2 % PADS 6 each  6 each Topical Daily Shah, Pratik D, DO   6 each at 12/28/23 1342   ciprofloxacin  (CIPRO ) IVPB 400 mg  400 mg Intravenous Q12H Shah, Pratik D, DO 200 mL/hr at 12/29/23 0002 400 mg at 12/29/23 0002   cycloSPORINE  (RESTASIS ) 0.05 % ophthalmic emulsion 1 drop  1 drop Both Eyes BID Maree, Pratik D, DO   1 drop at 12/29/23 0848   enoxaparin  (LOVENOX ) injection 40 mg  40 mg Subcutaneous  Q24H Adefeso, Oladapo, DO   40 mg at 12/29/23 0841   hydrALAZINE  (APRESOLINE ) tablet 50 mg  50 mg Oral Q8H Adefeso, Oladapo, DO   50 mg at 12/29/23 0847   insulin  aspart (novoLOG ) injection 0-9 Units  0-9 Units Subcutaneous TID WC Adefeso, Oladapo, DO   1 Units at 12/29/23 0840   irbesartan  (AVAPRO ) tablet 300 mg  300 mg Oral Daily Adefeso, Oladapo, DO   300 mg at 12/29/23 0844   ondansetron  (ZOFRAN ) tablet 4 mg  4 mg Oral Q6H PRN Adefeso, Oladapo, DO       Or   ondansetron  (ZOFRAN ) injection 4 mg  4 mg Intravenous Q6H PRN Adefeso, Oladapo, DO       pantoprazole  (PROTONIX ) EC tablet 40 mg  40 mg Oral BID AC Adefeso, Oladapo, DO   40 mg at 12/29/23 0845   sertraline  (ZOLOFT ) tablet 25 mg  25 mg Oral Daily Adefeso, Oladapo, DO   25 mg at 12/29/23 0846   sodium chloride  tablet 1 g  1 g Oral TID WC Shah, Pratik D, DO   1 g at 12/29/23 0845   tamsulosin (FLOMAX) capsule 0.4 mg  0.4 mg Oral Daily Shah, Pratik D, DO         Discharge Medications: Allergies as of 12/29/2023       Reactions   Statins Other (See Comments)   Severe myalgias   Phenergan [promethazine Hcl] Other (See Comments)   Hallucinations    Hydrocortisone Itching   Lisinopril Other (See Comments)   Lethargy, Fatigue   Prednisone  Rash   Trazodone  And Nefazodone Cough        Medication List     TAKE these medications    acetaminophen  325 MG tablet Commonly known as: TYLENOL  TAKE (2) TABLETS BY MOUTH EVERY SIX HOURS AS NEEDED FOR PAIN. MAX 3GM IN 24 HOURS. What changed: See the new instructions.   albuterol  108 (90 Base) MCG/ACT inhaler Commonly known as: VENTOLIN  HFA INHALE 2 PUFFS INTO THE LUNGS EVERY SIX HOURS AS NEEDED FOR WHEEZING OR SHORTNESS OF BREATH.   Allergy Relief 10 MG tablet Generic drug: loratadine  TAKE (1) TABLET BY MOUTH ONCE DAILY. What changed: See the new instructions.   ALPRAZolam  0.25 MG tablet Commonly known as: XANAX  Take 1 tablet (0.25 mg total) by mouth at bedtime.   amLODipine  10  MG tablet Commonly known as: NORVASC  TAKE (1) TABLET BY MOUTH ONCE DAILY. What changed: See the new instructions.   Aspirin  Low Dose 81 MG chewable tablet Generic drug: aspirin  CHEW (1) TABLET BY MOUTH ONCE DAILY. What changed: See the new instructions.   Beta Carotene Provitamin A 25000 units capsule Generic drug: beta carotene TAKE (1) CAPSULE BY MOUTH AT BEDTIME. What changed: See the new instructions.   bisacodyl  5 MG EC tablet Generic drug: bisacodyl  TAKE 1 TABLET BY MOUTH ONCE DAILY AS NEEDED FOR MODERATE CONSTIPATION.  What changed: See the new instructions.   ciprofloxacin  500 MG tablet Commonly known as: Cipro  Take 1 tablet (500 mg total) by mouth 2 (two) times daily for 2 days. What changed: additional instructions   cyanocobalamin  1000 MCG tablet Take 1,000 mcg by mouth at bedtime.   dicyclomine  10 MG capsule Commonly known as: BENTYL  TAKE 1 CAPSULE BY MOUTH TWICE DAILY AS NEEDED FOR ABDOMINAL CRAMPS AND LOOSE STOOL. What changed:  how much to take how to take this when to take this reasons to take this   EasyMax Test test strip Generic drug: glucose blood CHECK BLOOD SUGAR ONCE DAILY.(CALL MD IF BS BELOW 60: OR IF BS ABOVE 400)   FeroSul 325 (65 Fe) MG tablet Generic drug: ferrous sulfate  TAKE (1) TABLET BY MOUTH EVERY OTHER DAY. What changed: See the new instructions.   hydrALAZINE  50 MG tablet Commonly known as: APRESOLINE  TAKE (1) TABLET BY MOUTH EVERY EIGHT HOURS. What changed: See the new instructions.   iVIZIA Dry Eyes 0.5 % Soln Generic drug: Povidone (PF) Place 1 drop into both eyes 2 (two) times daily.   Melatonin 10 MG Tabs Take 10 mg by mouth at bedtime.   metFORMIN  500 MG tablet Commonly known as: GLUCOPHAGE  TAKE (1) TABLET BY MOUTH IN THE MORNING & (1/2) TABLET (250MG ) BY MOUTH AT SUPPER What changed:  how much to take how to take this when to take this   ondansetron  4 MG disintegrating tablet Commonly known as:  ZOFRAN -ODT 4mg  ODT q4 hours prn nausea/vomit What changed:  how much to take how to take this when to take this reasons to take this additional instructions   pantoprazole  40 MG tablet Commonly known as: PROTONIX  Take 1 tablet (40 mg total) by mouth 2 (two) times daily before a meal.   polyethylene glycol powder 17 GM/SCOOP powder Commonly known as: GoodSense ClearLax 1 scoop in 8 ounces water  once daily as needed for constipation What changed:  how much to take how to take this when to take this reasons to take this   PreserVision AREDS 2 Caps Take 1 capsule by mouth in the morning and at bedtime.   Restasis  0.05 % ophthalmic emulsion Generic drug: cycloSPORINE  Place 1 drop into both eyes 2 (two) times daily.   sertraline  25 MG tablet Commonly known as: ZOLOFT  TAKE (1) TABLET BY MOUTH ONCE DAILY. What changed: See the new instructions.   sodium chloride  1 g tablet Take 1 tablet (1 g total) by mouth daily. What changed: Another medication with the same name was added. Make sure you understand how and when to take each.   sodium chloride  1 g tablet Take 1 tablet (1 g total) by mouth 2 (two) times daily with a meal. What changed: You were already taking a medication with the same name, and this prescription was added. Make sure you understand how and when to take each.   sulfamethoxazole -trimethoprim  800-160 MG tablet Commonly known as: Bactrim  DS Take 1 tablet by mouth daily. Start taking on: January 01, 2024   tamsulosin 0.4 MG Caps capsule Commonly known as: FLOMAX Take 1 capsule (0.4 mg total) by mouth daily.   valsartan  320 MG tablet Commonly known as: DIOVAN  Take 1 tablet (320 mg total) by mouth daily.   Vitamin D  (Cholecalciferol ) 10 MCG (400 UNIT) Tabs TAKE (1) TABLET BY MOUTH ONCE DAILY. What changed: See the new instructions.         Relevant Imaging Results:  Relevant Lab Results:   Additional Information  SSN: [id number]  Lucie Lunger,  LCSWA

## 2023-12-29 NOTE — Plan of Care (Signed)
  Problem: Education: Goal: Knowledge of General Education information will improve Description: Including pain rating scale, medication(s)/side effects and non-pharmacologic comfort measures Outcome: Progressing   Problem: Nutrition: Goal: Adequate nutrition will be maintained Outcome: Progressing   Problem: Coping: Goal: Level of anxiety will decrease Outcome: Progressing   Problem: Elimination: Goal: Will not experience complications related to bowel motility Outcome: Progressing Goal: Will not experience complications related to urinary retention Outcome: Progressing   Problem: Pain Managment: Goal: General experience of comfort will improve and/or be controlled Outcome: Progressing   Problem: Coping: Goal: Ability to adjust to condition or change in health will improve Outcome: Progressing

## 2023-12-29 NOTE — Discharge Summary (Addendum)
 Physician Discharge Summary  [name] FMW:[id number] DOB: Jun 22, 1932 DOA: 12/27/2023  PCP: [name], MD  Admit date: 12/27/2023  Discharge date: 12/29/2023  Admitted From:ALF  Disposition:  ALF  Recommendations for Outpatient Follow-up:  Follow up with PCP in 1-2 weeks Continue on ciprofloxacin  as prescribed for Klebsiella/Pseudomonas UTI for 2 more days to complete course of treatment and then switch to Bactrim  DS once daily starting 7/3 for UTI prophylaxis Sodium chloride  tablets increased to twice daily, recheck BMP in 1-2 weeks Flomax prescribed for urinary retention issues Continue other medications as prior  Home Health: None  Equipment/Devices: None  Discharge Condition:Stable  CODE STATUS: DNR  Diet recommendation: Heart Healthy/carb modified  Brief/Interim Summary: [name] is a 89 y.o. female with medical history significant of hypertension, hyperlipidemia, T2DM, GERD mesenteric artery stenosis and stent placement to presents to the emergency department via EMS from long-term care facility complained of nausea and decreased appetite, she also reported lower abdominal discomfort and difficulty urinating.  She complained of chills without fever.  She was admitted with symptomatic UTI as well as hyponatremia.  Her hyponatremia has improved back to her usual baseline with added sodium chloride  intake.  She is known to have Klebsiella and Pseudomonas growth in her urine cultures that is sensitive to fluoroquinolones and she will remain on ciprofloxacin  for 2 more days to complete total 3-day course of treatment after having been empirically started on IV Rocephin  which she is resistant to.  She will then remain on Bactrim  DS daily for UTI prophylaxis and has been prescribed increased doses of sodium chloride  tablets to help with her hyponatremia and will need recheck of her BMP.  Finally, she has been started on Flomax for her urinary retention issues and hopefully  this will help to prevent further frequency of her UTIs.  No other acute events or concerns noted and she is now in stable condition for discharge back to her assisted living facility.  Discharge Diagnoses:  Principal Problem:   UTI (urinary tract infection) Active Problems:   Essential hypertension   Hyponatremia   Generalized anxiety disorder   GERD (gastroesophageal reflux disease)   Mesenteric artery stenosis (HCC)   Acute urinary retention   Type 2 diabetes mellitus with hyperglycemia (HCC)  Principal discharge diagnosis: Symptomatic hyponatremia in the setting of low solute intake along with Klebsiella and Pseudomonas UTI.  Discharge Instructions  Discharge Instructions     Diet - low sodium heart healthy   Complete by: As directed    Increase activity slowly   Complete by: As directed       Allergies as of 12/29/2023       Reactions   Statins Other (See Comments)   Severe myalgias   Phenergan [promethazine Hcl] Other (See Comments)   Hallucinations    Hydrocortisone Itching   Lisinopril Other (See Comments)   Lethargy, Fatigue   Prednisone  Rash   Trazodone  And Nefazodone Cough        Medication List     TAKE these medications    acetaminophen  325 MG tablet Commonly known as: TYLENOL  TAKE (2) TABLETS BY MOUTH EVERY SIX HOURS AS NEEDED FOR PAIN. MAX 3GM IN 24 HOURS. What changed: See the new instructions.   albuterol  108 (90 Base) MCG/ACT inhaler Commonly known as: VENTOLIN  HFA INHALE 2 PUFFS INTO THE LUNGS EVERY SIX HOURS AS NEEDED FOR WHEEZING OR SHORTNESS OF BREATH.   Allergy Relief 10 MG tablet Generic drug: loratadine  TAKE (1) TABLET BY MOUTH  ONCE DAILY. What changed: See the new instructions.   ALPRAZolam  0.25 MG tablet Commonly known as: XANAX  Take 1 tablet (0.25 mg total) by mouth at bedtime.   amLODipine  10 MG tablet Commonly known as: NORVASC  TAKE (1) TABLET BY MOUTH ONCE DAILY. What changed: See the new instructions.   Aspirin  Low  Dose 81 MG chewable tablet Generic drug: aspirin  CHEW (1) TABLET BY MOUTH ONCE DAILY. What changed: See the new instructions.   Beta Carotene Provitamin A 25000 units capsule Generic drug: beta carotene TAKE (1) CAPSULE BY MOUTH AT BEDTIME. What changed: See the new instructions.   bisacodyl  5 MG EC tablet Generic drug: bisacodyl  TAKE 1 TABLET BY MOUTH ONCE DAILY AS NEEDED FOR MODERATE CONSTIPATION. What changed: See the new instructions.   ciprofloxacin  500 MG tablet Commonly known as: Cipro  Take 1 tablet (500 mg total) by mouth 2 (two) times daily for 2 days. What changed: additional instructions   cyanocobalamin  1000 MCG tablet Take 1,000 mcg by mouth at bedtime.   dicyclomine  10 MG capsule Commonly known as: BENTYL  TAKE 1 CAPSULE BY MOUTH TWICE DAILY AS NEEDED FOR ABDOMINAL CRAMPS AND LOOSE STOOL. What changed:  how much to take how to take this when to take this reasons to take this   EasyMax Test test strip Generic drug: glucose blood CHECK BLOOD SUGAR ONCE DAILY.(CALL MD IF BS BELOW 60: OR IF BS ABOVE 400)   FeroSul 325 (65 Fe) MG tablet Generic drug: ferrous sulfate  TAKE (1) TABLET BY MOUTH EVERY OTHER DAY. What changed: See the new instructions.   hydrALAZINE  50 MG tablet Commonly known as: APRESOLINE  TAKE (1) TABLET BY MOUTH EVERY EIGHT HOURS. What changed: See the new instructions.   iVIZIA Dry Eyes 0.5 % Soln Generic drug: Povidone (PF) Place 1 drop into both eyes 2 (two) times daily.   Melatonin 10 MG Tabs Take 10 mg by mouth at bedtime.   metFORMIN  500 MG tablet Commonly known as: GLUCOPHAGE  TAKE (1) TABLET BY MOUTH IN THE MORNING & (1/2) TABLET (250MG ) BY MOUTH AT SUPPER What changed:  how much to take how to take this when to take this   ondansetron  4 MG disintegrating tablet Commonly known as: ZOFRAN -ODT 4mg  ODT q4 hours prn nausea/vomit What changed:  how much to take how to take this when to take this reasons to take  this additional instructions   pantoprazole  40 MG tablet Commonly known as: PROTONIX  Take 1 tablet (40 mg total) by mouth 2 (two) times daily before a meal.   polyethylene glycol powder 17 GM/SCOOP powder Commonly known as: GoodSense ClearLax 1 scoop in 8 ounces water  once daily as needed for constipation What changed:  how much to take how to take this when to take this reasons to take this   PreserVision AREDS 2 Caps Take 1 capsule by mouth in the morning and at bedtime.   Restasis  0.05 % ophthalmic emulsion Generic drug: cycloSPORINE  Place 1 drop into both eyes 2 (two) times daily.   sertraline  25 MG tablet Commonly known as: ZOLOFT  TAKE (1) TABLET BY MOUTH ONCE DAILY. What changed: See the new instructions.   sodium chloride  1 g tablet Take 1 tablet (1 g total) by mouth 2 (two) times daily with a meal. What changed: when to take this   sulfamethoxazole -trimethoprim  800-160 MG tablet Commonly known as: Bactrim  DS Take 1 tablet by mouth daily. Start taking on: January 01, 2024   tamsulosin 0.4 MG Caps capsule Commonly known as: FLOMAX Take 1  capsule (0.4 mg total) by mouth daily.   valsartan  320 MG tablet Commonly known as: DIOVAN  Take 1 tablet (320 mg total) by mouth daily.   Vitamin D  (Cholecalciferol ) 10 MCG (400 UNIT) Tabs TAKE (1) TABLET BY MOUTH ONCE DAILY. What changed: See the new instructions.        Follow-up Information     Luking, Glendia LABOR, MD. Schedule an appointment as soon as possible for a visit in 1 week(s).   Specialty: Family Medicine Contact information: [address] [place] [address] [phone]                Allergies  Allergen Reactions   Statins Other (See Comments)    Severe myalgias   Phenergan [Promethazine Hcl] Other (See Comments)    Hallucinations    Hydrocortisone Itching   Lisinopril Other (See Comments)    Lethargy, Fatigue   Prednisone  Rash   Trazodone  And Nefazodone Cough     Consultations: None   Procedures/Studies: DG Chest Port 1 View Result Date: 12/19/2023 CLINICAL DATA:  pain EXAM: PORTABLE CHEST - 1 VIEW COMPARISON:  The 12/13/2023. FINDINGS: Cardiac silhouette enlarged. No evidence of pneumothorax or pleural effusion. No evidence of pulmonary edema. Aorta is calcified. No osseous abnormalities identified. IMPRESSION: Enlarged cardiac silhouette. Electronically Signed   By: Fonda Field M.D.   On: 12/19/2023 19:38   ECHOCARDIOGRAM COMPLETE Result Date: 12/14/2023    ECHOCARDIOGRAM REPORT   Patient Name:   KASHIA BROSSARD Date of Exam: 12/14/2023 Medical Rec #:  [id number]       Height:       68.0 in Accession #:    [id number]      Weight:       164.9 lb Date of Birth:  13-Sep-1931       BSA:          1.883 m Patient Age:    89 years        BP:           155/43 mmHg Patient Gender: F               HR:           62 bpm. Exam Location:  [facility] Procedure: 2D Echo, Cardiac Doppler and Color Doppler (Both Spectral and Color            Flow Doppler were utilized during procedure). Indications:     Chest Pain R07.9  History:         Patient has prior history of Echocardiogram examinations, most                  recent 09/17/2023. Signs/Symptoms:Chest Pain; Risk                  Factors:Hypertension, Diabetes and Dyslipidemia.  Sonographer:     Koleen Popper RDCS Referring Phys:  [id number] SIGURD PAC Diagnosing Phys: Lonni Nanas MD IMPRESSIONS  1. Left ventricular ejection fraction, by estimation, is 70 to 75%. The left ventricle has hyperdynamic function. The left ventricle has no regional wall motion abnormalities. There is mild left ventricular hypertrophy. Left ventricular diastolic parameters are indeterminate.  2. Right ventricular systolic function is normal. The right ventricular size is normal. There is normal pulmonary artery systolic pressure. The estimated right ventricular systolic pressure is 23.7 mmHg.  3. The mitral valve is normal in structure.  Trivial mitral valve regurgitation.  4. The aortic valve is tricuspid. Aortic valve regurgitation is not visualized.  Aortic valve sclerosis is present, with no evidence of aortic valve stenosis.  5. The inferior vena cava is dilated in size with >50% respiratory variability, suggesting right atrial pressure of 8 mmHg. FINDINGS  Left Ventricle: Left ventricular ejection fraction, by estimation, is 70 to 75%. The left ventricle has hyperdynamic function. The left ventricle has no regional wall motion abnormalities. The left ventricular internal cavity size was normal in size. There is mild left ventricular hypertrophy. Left ventricular diastolic parameters are indeterminate. Right Ventricle: The right ventricular size is normal. No increase in right ventricular wall thickness. Right ventricular systolic function is normal. There is normal pulmonary artery systolic pressure. The tricuspid regurgitant velocity is 1.98 m/s, and  with an assumed right atrial pressure of 8 mmHg, the estimated right ventricular systolic pressure is 23.7 mmHg. Left Atrium: Left atrial size was normal in size. Right Atrium: Right atrial size was not well visualized. Pericardium: Trivial pericardial effusion is present. Mitral Valve: The mitral valve is normal in structure. Trivial mitral valve regurgitation. Tricuspid Valve: The tricuspid valve is normal in structure. Tricuspid valve regurgitation is trivial. Aortic Valve: The aortic valve is tricuspid. Aortic valve regurgitation is not visualized. Aortic valve sclerosis is present, with no evidence of aortic valve stenosis. Pulmonic Valve: The pulmonic valve was not well visualized. Pulmonic valve regurgitation is not visualized. Aorta: The aortic root and ascending aorta are structurally normal, with no evidence of dilitation. Venous: The inferior vena cava is dilated in size with greater than 50% respiratory variability, suggesting right atrial pressure of 8 mmHg. IAS/Shunts: The  interatrial septum was not well visualized.  LEFT VENTRICLE PLAX 2D LVIDd:         4.00 cm   Diastology LVIDs:         2.40 cm   LV e' medial:    7.28 cm/s LV PW:         1.00 cm   LV E/e' medial:  13.4 LV IVS:        1.10 cm   LV e' lateral:   7.20 cm/s LVOT diam:     1.80 cm   LV E/e' lateral: 13.5 LV SV:         69 LV SV Index:   37 LVOT Area:     2.54 cm  RIGHT VENTRICLE             IVC RV S prime:     13.10 cm/s  IVC diam: 2.20 cm TAPSE (M-mode): 2.8 cm LEFT ATRIUM             Index LA diam:        4.00 cm 2.12 cm/m LA Vol (A2C):   51.2 ml 27.19 ml/m LA Vol (A4C):   46.3 ml 24.59 ml/m LA Biplane Vol: 48.9 ml 25.97 ml/m  AORTIC VALVE LVOT Vmax:   103.95 cm/s LVOT Vmean:  71.700 cm/s LVOT VTI:    0.272 m  AORTA Ao Root diam: 2.80 cm Ao Asc diam:  3.10 cm MITRAL VALVE               TRICUSPID VALVE MV Area (PHT): 2.26 cm    TR Peak grad:   15.7 mmHg MV Decel Time: 335 msec    TR Vmax:        198.00 cm/s MR Peak grad: 37.0 mmHg MR Vmax:      304.00 cm/s  SHUNTS MV E velocity: 97.20 cm/s  Systemic VTI:  0.27 m MV A velocity: 98.50 cm/s  Systemic Diam: 1.80 cm MV E/A ratio:  0.99 Lonni Nanas MD Electronically signed by Lonni Nanas MD Signature Date/Time: 12/14/2023/10:37:06 AM    Final (Updated)    DG Chest Port 1 View Result Date: 12/13/2023 CLINICAL DATA:  Shortness of breath, chest pain, and hypertension. EXAM: PORTABLE CHEST 1 VIEW COMPARISON:  09/22/2023. FINDINGS: The heart size and mediastinal contours are within normal limits. There is atherosclerotic calcification of the aorta. Mild atelectasis or scarring is present at the lung bases. No effusion or pneumothorax is seen. No acute osseous abnormality. IMPRESSION: Mild atelectasis or scarring at the lung bases. No acute infiltrate is seen. Electronically Signed   By: Leita Birmingham M.D.   On: 12/13/2023 17:10     Discharge Exam: Vitals:   12/28/23 1942 12/29/23 0510  BP: (!) 151/55 (!) 141/63  Pulse: 70 71  Resp: 20 18  Temp:  98.1 F (36.7 C) 97.9 F (36.6 C)  SpO2: 98% 98%   Vitals:   12/28/23 1239 12/28/23 1631 12/28/23 1942 12/29/23 0510  BP: (!) 149/54 (!) 178/74 (!) 151/55 (!) 141/63  Pulse: 76  70 71  Resp: 16  20 18   Temp: 98 F (36.7 C)  98.1 F (36.7 C) 97.9 F (36.6 C)  TempSrc: Oral  Oral   SpO2: 98%  98% 98%  Weight:      Height:        General: Pt is alert, awake, not in acute distress Cardiovascular: RRR, S1/S2 +, no rubs, no gallops Respiratory: CTA bilaterally, no wheezing, no rhonchi Abdominal: Soft, NT, ND, bowel sounds + Extremities: no edema, no cyanosis    The results of significant diagnostics from this hospitalization (including imaging, microbiology, ancillary and laboratory) are listed below for reference.     Microbiology: Recent Results (from the past 240 hours)  Urine Culture     Status: Abnormal   Collection Time: 12/24/23 12:00 AM   Specimen: Urine   Urine  Result Value Ref Range Status   Urine Culture, Routine Final report (A)  Final   Organism ID, Bacteria Klebsiella pneumoniae (A)  Final    Comment: Susceptibility profile is consistent with a probable ESBL. Multi-Drug Resistant Organism Greater than 100,000 colony forming units per mL    ORGANISM ID, BACTERIA Comment (A)  Final    Comment: Pseudomonas aeruginosa Ceftazidime-avibactam and ceftolozane-tazobactam may be considered for therapy ONLY when multi-drug resistance (MDR) is demonstrated to meropenem and other tested agents. 10,000-25,000 colony forming units per mL    Antimicrobial Susceptibility Comment  Final    Comment:       ** S = Susceptible; I = Intermediate; R = Resistant **                    P = Positive; N = Negative             MICS are expressed in micrograms per mL    Antibiotic                 RSLT#1    RSLT#2    RSLT#3    RSLT#4 Amikacin                                 S Amoxicillin /Clavulanic Acid    S Ampicillin                     R Cefazolin   R Cefepime                        R         S Cefoxitin                      S Cefpodoxime                    R Ceftazidime                              S Ceftazidime/avibactam                    S Ceftolozane/tazobactam                   S Ceftriaxone                     R Ciprofloxacin                   S         S Ertapenem                      S Gentamicin                     S Levofloxacin                    I         S Meropenem                      S         S Nitrofurantoin                  R Piperacillin/Tazobactam        S Tetracycline                   R Tobramycin                      S         S  Trimethoprim /Sulfa              S   Culture, blood (routine x 2)     Status: None (Preliminary result)   Collection Time: 12/27/23  8:40 PM   Specimen: Left Antecubital; Blood  Result Value Ref Range Status   Specimen Description LEFT ANTECUBITAL  Final   Special Requests   Final    BOTTLES DRAWN AEROBIC AND ANAEROBIC Blood Culture adequate volume   Culture   Final    NO GROWTH < 12 HOURS Performed at [facility], [address], [place], [place] [address]    Report Status PENDING  Incomplete  Culture, blood (routine x 2)     Status: None (Preliminary result)   Collection Time: 12/27/23  8:43 PM   Specimen: Right Antecubital; Blood  Result Value Ref Range Status   Specimen Description RIGHT ANTECUBITAL  Final   Special Requests   Final    BOTTLES DRAWN AEROBIC ONLY Blood Culture adequate volume   Culture   Final    NO GROWTH < 12 HOURS Performed at [facility], [address], [place], [place] [address]    Report Status PENDING  Incomplete     Labs: BNP (last 3 results) Recent Labs  04/08/23 0247 07/24/23 1456 09/22/23 1848  BNP 83.0 88.0 88.0   Basic Metabolic Panel: Recent Labs  Lab 12/26/23 0958 12/27/23 2043 12/27/23 2224 12/28/23 0239 12/29/23 0325  NA 128* 120* 124* 125* 133*  K 4.6 4.1  --  3.6 4.0  CL 90* 85*  --  94* 99  CO2 21 23  --  23 26   GLUCOSE 121* 143*  --  116* 116*  BUN 16 19  --  15 15  CREATININE 1.04* 0.92  --  0.78 0.81  CALCIUM 9.6 9.0  --  8.3* 8.6*  MG  --   --   --  1.6* 1.9  PHOS  --   --   --  3.3  --    Liver Function Tests: Recent Labs  Lab 12/28/23 0239  AST 15  ALT 11  ALKPHOS 66  BILITOT 0.6  PROT 5.5*  ALBUMIN 3.0*   No results for input(s): LIPASE, AMYLASE in the last 168 hours. No results for input(s): AMMONIA in the last 168 hours. CBC: Recent Labs  Lab 12/27/23 2043 12/28/23 0239 12/29/23 0325  WBC 7.7 6.2 6.1  NEUTROABS 5.3  --   --   HGB 11.2* 9.9* 10.9*  HCT 32.7* 30.4* 32.8*  MCV 86.7 88.6 89.1  PLT 323 283 305   Cardiac Enzymes: No results for input(s): CKTOTAL, CKMB, CKMBINDEX, TROPONINI in the last 168 hours. BNP: Invalid input(s): POCBNP CBG: Recent Labs  Lab 12/28/23 0805 12/28/23 1156 12/28/23 1605 12/28/23 2016 12/29/23 0741  GLUCAP 129* 141* 141* 128* 141*   D-Dimer No results for input(s): DDIMER in the last 72 hours. Hgb A1c No results for input(s): HGBA1C in the last 72 hours. Lipid Profile No results for input(s): CHOL, HDL, LDLCALC, TRIG, CHOLHDL, LDLDIRECT in the last 72 hours. Thyroid  function studies Recent Labs    12/28/23 0716  TSH 1.313   Anemia work up No results for input(s): VITAMINB12, FOLATE, FERRITIN, TIBC, IRON , RETICCTPCT in the last 72 hours. Urinalysis    Component Value Date/Time   COLORURINE STRAW (A) 12/27/2023 2052   APPEARANCEUR CLEAR 12/27/2023 2052   APPEARANCEUR Clear 05/21/2023 1333   LABSPEC 1.008 12/27/2023 2052   PHURINE 6.0 12/27/2023 2052   GLUCOSEU NEGATIVE 12/27/2023 2052   HGBUR NEGATIVE 12/27/2023 2052   BILIRUBINUR NEGATIVE 12/27/2023 2052   BILIRUBINUR negative 12/24/2023 1343   BILIRUBINUR Negative 05/21/2023 1333   KETONESUR NEGATIVE 12/27/2023 2052   PROTEINUR 100 (A) 12/27/2023 2052   UROBILINOGEN 0.2 12/24/2023 1343   UROBILINOGEN 0.2 12/15/2014  1956   NITRITE NEGATIVE 12/27/2023 2052   LEUKOCYTESUR NEGATIVE 12/27/2023 2052   Sepsis Labs Recent Labs  Lab 12/27/23 2043 12/28/23 0239 12/29/23 0325  WBC 7.7 6.2 6.1   Microbiology Recent Results (from the past 240 hours)  Urine Culture     Status: Abnormal   Collection Time: 12/24/23 12:00 AM   Specimen: Urine   Urine  Result Value Ref Range Status   Urine Culture, Routine Final report (A)  Final   Organism ID, Bacteria Klebsiella pneumoniae (A)  Final    Comment: Susceptibility profile is consistent with a probable ESBL. Multi-Drug Resistant Organism Greater than 100,000 colony forming units per mL    ORGANISM ID, BACTERIA Comment (A)  Final    Comment: Pseudomonas aeruginosa Ceftazidime-avibactam and ceftolozane-tazobactam may be considered for therapy ONLY when multi-drug resistance (MDR) is demonstrated to meropenem and other tested agents. 10,000-25,000 colony forming units per mL    Antimicrobial Susceptibility Comment  Final    Comment:       ** S = Susceptible; I = Intermediate; R = Resistant **                    P = Positive; N = Negative             MICS are expressed in micrograms per mL    Antibiotic                 RSLT#1    RSLT#2    RSLT#3    RSLT#4 Amikacin                                 S Amoxicillin /Clavulanic Acid    S Ampicillin                     R Cefazolin                       R Cefepime                        R         S Cefoxitin                      S Cefpodoxime                    R Ceftazidime                              S Ceftazidime/avibactam                    S Ceftolozane/tazobactam                   S Ceftriaxone                     R Ciprofloxacin                   S         S Ertapenem                      S Gentamicin                     S Levofloxacin                    I         S Meropenem                      S         S Nitrofurantoin                  R Piperacillin/Tazobactam        S Tetracycline                    R Tobramycin                      S         S  Trimethoprim /Sulfa              S   Culture, blood (routine x 2)     Status: None (Preliminary result)   Collection  Time: 12/27/23  8:40 PM   Specimen: Left Antecubital; Blood  Result Value Ref Range Status   Specimen Description LEFT ANTECUBITAL  Final   Special Requests   Final    BOTTLES DRAWN AEROBIC AND ANAEROBIC Blood Culture adequate volume   Culture   Final    NO GROWTH < 12 HOURS Performed at [facility], [address], [place], [place] [address]    Report Status PENDING  Incomplete  Culture, blood (routine x 2)     Status: None (Preliminary result)   Collection Time: 12/27/23  8:43 PM   Specimen: Right Antecubital; Blood  Result Value Ref Range Status   Specimen Description RIGHT ANTECUBITAL  Final   Special Requests   Final    BOTTLES DRAWN AEROBIC ONLY Blood Culture adequate volume   Culture   Final    NO GROWTH < 12 HOURS Performed at [facility], [address] [place], [place] [address]    Report Status PENDING  Incomplete     Time coordinating discharge: 35 minutes  SIGNED:   Adron JONETTA Fairly, DO [facility] 12/29/2023, 12:08 PM  If 7PM-7AM, please contact night-coverage [email or web address]

## 2023-12-29 NOTE — [place] FL2 (Signed)
 [place]  MEDICAID FL2 LEVEL OF CARE FORM     IDENTIFICATION  Patient Name: [name] Birthdate: [birth year] Sex: female Admission Date (Current Location): 12/27/2023  [facility] and [place] Number:  [employer] and Address:  [facility],  [address] [place] [address]      Provider Number: [id number]  Attending Physician Name and Address:  Maree Adron BIRCH, DO  Relative Name and Phone Number:       Current Level of Care: Hospital Recommended Level of Care: Assisted Living Facility Prior Approval Number:    Date Approved/Denied:   PASRR Number:    Discharge Plan: Other (Comment)    Current Diagnoses: Patient Active Problem List   Diagnosis Date Noted   Acute urinary retention 12/28/2023   Type 2 diabetes mellitus with hyperglycemia (HCC) 12/28/2023   UTI (urinary tract infection) 12/27/2023   Frequency of urination 12/24/2023   Acute blood loss anemia 04/17/2023   Gastritis and gastroduodenitis 04/10/2023   Abdominal pain, chronic, epigastric 04/08/2023   Mesenteric artery stenosis (HCC) 04/07/2023   Diabetic neuropathy (HCC) 11/30/2022   S/P total knee arthroplasty, right 04/12/2022   Aortic atherosclerosis (HCC) 02/25/2022   Chronic non-seasonal allergic rhinitis 02/25/2022   GERD (gastroesophageal reflux disease) 02/17/2022   Myalgia due to statin 02/15/2020   Cognitive dysfunction 03/04/2019   Generalized anxiety disorder 06/05/2018   Hyponatremia 03/08/2018   Osteoarthritis of right knee 12/02/2017   Hyperlipidemia associated with type 2 diabetes mellitus (HCC) 10/02/2016   Insomnia 10/02/2016   Essential hypertension 12/15/2014   Type 2 diabetes mellitus with atherosclerosis of aorta (HCC) 02/15/2011    Orientation RESPIRATION BLADDER Height & Weight     Self, Time, Situation, Place  Normal Continent Weight: 172 lb 2.9 oz (78.1 kg) Height:  5' 8 (172.7 cm)  BEHAVIORAL SYMPTOMS/MOOD NEUROLOGICAL BOWEL NUTRITION STATUS       Continent Diet (reduced concentrated sweets, no added table salt)  AMBULATORY STATUS COMMUNICATION OF NEEDS Skin   Limited Assist Verbally Normal                       Personal Care Assistance Level of Assistance  Bathing, Feeding, Dressing Bathing Assistance: Limited assistance Feeding assistance: Independent Dressing Assistance: Limited assistance     Functional Limitations Info  Sight, Hearing, Speech Sight Info: Impaired Hearing Info: Adequate Speech Info: Adequate    SPECIAL CARE FACTORS FREQUENCY                       Contractures Contractures Info: Not present    Additional Factors Info  Code Status, Allergies Code Status Info: DNR- Limited Allergies Info: Hydrocortisone, Lisinopril, Phenergan (Promethazine Hcl), Statins, Trazodone  And Nefazodone, Prednisone            Current Medications (12/29/2023):  This is the current hospital active medication list Current Facility-Administered Medications  Medication Dose Route Frequency Provider Last Rate Last Admin   acetaminophen  (TYLENOL ) tablet 650 mg  650 mg Oral Q6H PRN Adefeso, Oladapo, DO       Or   acetaminophen  (TYLENOL ) suppository 650 mg  650 mg Rectal Q6H PRN Adefeso, Oladapo, DO       ALPRAZolam  (XANAX ) tablet 0.25 mg  0.25 mg Oral QHS Adefeso, Oladapo, DO   0.25 mg at 12/28/23 2113   amLODipine  (NORVASC ) tablet 10 mg  10 mg Oral Daily Adefeso, Oladapo, DO   10 mg at 12/29/23 0846   aspirin  chewable tablet 81 mg  81 mg Oral Daily Adefeso, Oladapo, DO   81 mg at 12/29/23 0847   Chlorhexidine  Gluconate Cloth 2 % PADS 6 each  6 each Topical Daily Shah, Pratik D, DO   6 each at 12/28/23 1342   ciprofloxacin  (CIPRO ) IVPB 400 mg  400 mg Intravenous Q12H Shah, Pratik D, DO 200 mL/hr at 12/29/23 0002 400 mg at 12/29/23 0002   cycloSPORINE  (RESTASIS ) 0.05 % ophthalmic emulsion 1 drop  1 drop Both Eyes BID Maree, Pratik D, DO   1 drop at 12/29/23 0848   enoxaparin  (LOVENOX ) injection 40 mg  40 mg Subcutaneous  Q24H Adefeso, Oladapo, DO   40 mg at 12/29/23 0841   hydrALAZINE  (APRESOLINE ) tablet 50 mg  50 mg Oral Q8H Adefeso, Oladapo, DO   50 mg at 12/29/23 0847   insulin  aspart (novoLOG ) injection 0-9 Units  0-9 Units Subcutaneous TID WC Adefeso, Oladapo, DO   1 Units at 12/29/23 0840   irbesartan  (AVAPRO ) tablet 300 mg  300 mg Oral Daily Adefeso, Oladapo, DO   300 mg at 12/29/23 0844   ondansetron  (ZOFRAN ) tablet 4 mg  4 mg Oral Q6H PRN Adefeso, Oladapo, DO       Or   ondansetron  (ZOFRAN ) injection 4 mg  4 mg Intravenous Q6H PRN Adefeso, Oladapo, DO       pantoprazole  (PROTONIX ) EC tablet 40 mg  40 mg Oral BID AC Adefeso, Oladapo, DO   40 mg at 12/29/23 0845   sertraline  (ZOLOFT ) tablet 25 mg  25 mg Oral Daily Adefeso, Oladapo, DO   25 mg at 12/29/23 0846   sodium chloride  tablet 1 g  1 g Oral TID WC Shah, Pratik D, DO   1 g at 12/29/23 0845   tamsulosin (FLOMAX) capsule 0.4 mg  0.4 mg Oral Daily Shah, Pratik D, DO         Discharge Medications: Allergies as of 12/29/2023       Reactions   Statins Other (See Comments)   Severe myalgias   Phenergan [promethazine Hcl] Other (See Comments)   Hallucinations    Hydrocortisone Itching   Lisinopril Other (See Comments)   Lethargy, Fatigue   Prednisone  Rash   Trazodone  And Nefazodone Cough        Medication List     TAKE these medications    acetaminophen  325 MG tablet Commonly known as: TYLENOL  TAKE (2) TABLETS BY MOUTH EVERY SIX HOURS AS NEEDED FOR PAIN. MAX 3GM IN 24 HOURS. What changed: See the new instructions.   albuterol  108 (90 Base) MCG/ACT inhaler Commonly known as: VENTOLIN  HFA INHALE 2 PUFFS INTO THE LUNGS EVERY SIX HOURS AS NEEDED FOR WHEEZING OR SHORTNESS OF BREATH.   Allergy Relief 10 MG tablet Generic drug: loratadine  TAKE (1) TABLET BY MOUTH ONCE DAILY. What changed: See the new instructions.   ALPRAZolam  0.25 MG tablet Commonly known as: XANAX  Take 1 tablet (0.25 mg total) by mouth at bedtime.   amLODipine  10  MG tablet Commonly known as: NORVASC  TAKE (1) TABLET BY MOUTH ONCE DAILY. What changed: See the new instructions.   Aspirin  Low Dose 81 MG chewable tablet Generic drug: aspirin  CHEW (1) TABLET BY MOUTH ONCE DAILY. What changed: See the new instructions.   Beta Carotene Provitamin A 25000 units capsule Generic drug: beta carotene TAKE (1) CAPSULE BY MOUTH AT BEDTIME. What changed: See the new instructions.   bisacodyl  5 MG EC tablet Generic drug: bisacodyl  TAKE 1 TABLET BY MOUTH ONCE DAILY AS NEEDED FOR MODERATE CONSTIPATION.  What changed: See the new instructions.   ciprofloxacin  500 MG tablet Commonly known as: Cipro  Take 1 tablet (500 mg total) by mouth 2 (two) times daily for 2 days. What changed: additional instructions   cyanocobalamin  1000 MCG tablet Take 1,000 mcg by mouth at bedtime.   dicyclomine  10 MG capsule Commonly known as: BENTYL  TAKE 1 CAPSULE BY MOUTH TWICE DAILY AS NEEDED FOR ABDOMINAL CRAMPS AND LOOSE STOOL. What changed:  how much to take how to take this when to take this reasons to take this   EasyMax Test test strip Generic drug: glucose blood CHECK BLOOD SUGAR ONCE DAILY.(CALL MD IF BS BELOW 60: OR IF BS ABOVE 400)   FeroSul 325 (65 Fe) MG tablet Generic drug: ferrous sulfate  TAKE (1) TABLET BY MOUTH EVERY OTHER DAY. What changed: See the new instructions.   hydrALAZINE  50 MG tablet Commonly known as: APRESOLINE  TAKE (1) TABLET BY MOUTH EVERY EIGHT HOURS. What changed: See the new instructions.   iVIZIA Dry Eyes 0.5 % Soln Generic drug: Povidone (PF) Place 1 drop into both eyes 2 (two) times daily.   Melatonin 10 MG Tabs Take 10 mg by mouth at bedtime.   metFORMIN  500 MG tablet Commonly known as: GLUCOPHAGE  TAKE (1) TABLET BY MOUTH IN THE MORNING & (1/2) TABLET (250MG ) BY MOUTH AT SUPPER What changed:  how much to take how to take this when to take this   ondansetron  4 MG disintegrating tablet Commonly known as:  ZOFRAN -ODT 4mg  ODT q4 hours prn nausea/vomit What changed:  how much to take how to take this when to take this reasons to take this additional instructions   pantoprazole  40 MG tablet Commonly known as: PROTONIX  Take 1 tablet (40 mg total) by mouth 2 (two) times daily before a meal.   polyethylene glycol powder 17 GM/SCOOP powder Commonly known as: GoodSense ClearLax 1 scoop in 8 ounces water  once daily as needed for constipation What changed:  how much to take how to take this when to take this reasons to take this   PreserVision AREDS 2 Caps Take 1 capsule by mouth in the morning and at bedtime.   Restasis  0.05 % ophthalmic emulsion Generic drug: cycloSPORINE  Place 1 drop into both eyes 2 (two) times daily.   sertraline  25 MG tablet Commonly known as: ZOLOFT  TAKE (1) TABLET BY MOUTH ONCE DAILY. What changed: See the new instructions.   sodium chloride  1 g tablet Take 1 tablet (1 g total) by mouth 2 (two) times daily with a meal. What changed: when to take this   sulfamethoxazole -trimethoprim  800-160 MG tablet Commonly known as: Bactrim  DS Take 1 tablet by mouth daily. Start taking on: January 01, 2024   tamsulosin 0.4 MG Caps capsule Commonly known as: FLOMAX Take 1 capsule (0.4 mg total) by mouth daily.   valsartan  320 MG tablet Commonly known as: DIOVAN  Take 1 tablet (320 mg total) by mouth daily.   Vitamin D  (Cholecalciferol ) 10 MCG (400 UNIT) Tabs TAKE (1) TABLET BY MOUTH ONCE DAILY. What changed: See the new instructions.         Relevant Imaging Results:  Relevant Lab Results:   Additional Information SSN: [id number]  Lucie Lunger, LCSWA

## 2023-12-31 ENCOUNTER — Encounter: Payer: Self-pay | Admitting: Family Medicine

## 2023-12-31 ENCOUNTER — Ambulatory Visit (INDEPENDENT_AMBULATORY_CARE_PROVIDER_SITE_OTHER): Admitting: Family Medicine

## 2023-12-31 VITALS — BP 136/60 | HR 82 | Temp 97.3°F | Ht 68.0 in | Wt 174.0 lb

## 2023-12-31 DIAGNOSIS — I10 Essential (primary) hypertension: Secondary | ICD-10-CM

## 2023-12-31 DIAGNOSIS — E785 Hyperlipidemia, unspecified: Secondary | ICD-10-CM | POA: Diagnosis not present

## 2023-12-31 DIAGNOSIS — E119 Type 2 diabetes mellitus without complications: Secondary | ICD-10-CM

## 2023-12-31 DIAGNOSIS — N390 Urinary tract infection, site not specified: Secondary | ICD-10-CM | POA: Diagnosis not present

## 2023-12-31 DIAGNOSIS — E1169 Type 2 diabetes mellitus with other specified complication: Secondary | ICD-10-CM

## 2023-12-31 MED ORDER — HYDRALAZINE HCL 25 MG PO TABS
25.0000 mg | ORAL_TABLET | Freq: Three times a day (TID) | ORAL | 5 refills | Status: DC
Start: 2023-12-31 — End: 2024-05-05

## 2023-12-31 NOTE — Progress Notes (Signed)
 Subjective:    Patient ID: [name], female    DOB: 12/21/1931, 89 y.o.   MRN: [id number]  HPI  UTI hospital follow up follow up , Discuss preventative re-ocurring   scratchy throat since cipro  abx  Discussed the use of AI scribe software for clinical note transcription with the patient, who gave verbal consent to proceed.  History of Present Illness   [name] is a 89 year old female who presents with throat irritation and medication concerns.  She experiences throat irritation and voice changes, which she associates with recent antibiotic use. The sensation is described as an irritation rather than a burning pain, with temporary relief from cough drops. No cough is present, but she frequently uses cough drops to manage the irritation.  She has a history of recurrent urinary tract infections and was recently treated in the hospital with antibiotics for Pseudomonas and Klebsiella infections. During her hospital stay, she received three bags of antibiotics and fluids. She completed a course of Cipro  and is concerned about long-term antibiotic use due to a previous experience with C difficile infection.  There has been a recent increase in her sodium tablet dosage, currently taking one gram daily. She reports a decrease in appetite over the past week or two, attributing it to the antibiotics. She is concerned about her blood pressure, noting discrepancies between her home readings and those taken at the healthcare facility.  She experiences difficulty walking, describing a sensation of rapid heartbeats when attempting to walk or stand. Lightheadedness occurs when standing, which she associates with her blood pressure medication regimen.  Her current medications include alprazolam  for sleep, amlodipine  for blood pressure, aspirin  for cardiovascular protection, and a sodium supplement. She also takes pantoprazole  twice daily for acid reflux and cranberry pills for urinary tract  health. She wants to reduce her medication burden.      Review of Systems     Objective:   Physical Exam  General-in no acute distress Eyes-no discharge Lungs-respiratory rate normal, CTA CV-no murmurs,RRR Extremities skin warm dry no edema Neuro grossly normal Behavior normal, alert  Hospital labs, hospital task, although this was reviewed in detail     Assessment & Plan:  Assessment and Plan    Urinary tract infection (UTI) Recurrent UTIs with Pseudomonas and Klebsiella. Discussed risks of long-term antibiotics and decided against prophylaxis. Cranberry pills discussed as a preventive option. - Complete current course of ciprofloxacin . - Avoid long-term Bactrim  use. - Consider cranberry pills as a preventive measure.  Sore throat Sore throat potentially related to ciprofloxacin  use. - Complete current course of ciprofloxacin .  Hypertension Orthostatic hypotension noted. Current hydralazine  may cause reflex tachycardia and orthostatic hypotension. Plan to reduce dosage. - Reduce hydralazine  from 50 mg to 25 mg three times a day. - Monitor blood pressure and symptoms, especially when standing.  Diabetes mellitus A1c at 6.3%, indicating well-controlled diabetes. Cranberry juice might affect blood sugar, but current A1c is acceptable. - Continue current diabetes management plan.  Polypharmacy Reviewed medication list to reduce unnecessary medications. Emphasized balancing benefits and risks, especially regarding antibiotics and aspirin . - Discontinue iron  tablet, loratadine , and melatonin. - Continue alprazolam , amlodipine , aspirin , pantoprazole , sertraline , valsartan , B12, vitamin D , and PRN medications. - Reassess the need for tamsulosin after July 20th.  Follow-up Scheduled follow-up to reassess condition and medication regimen. - Follow up on August 21st or sooner if any issues arise.         1. Controlled type 2 diabetes mellitus without  complication,  without long-term current use of insulin  (HCC) (Primary) A1c most recent reading looks fairly good.  Patient has follow-up in August continue healthy eating continue current diabetic medicines  2. Essential hypertension Blood pressure actually drops too much when she stands I recommend reducing her hydralazine  instead of 50 mg 3 times daily we will reduce that to 25 mg 3 times daily  3. Hyperlipidemia associated with type 2 diabetes mellitus (HCC) Healthy diet  4. Frequent UTI I do not recommend Bactrim  she has a history of C. difficile she will finish up the Cipro  then no further antibiotics show use of tamsulosin through July 20 and after that stop it she will follow-up with us  in early August follow-up sooner if any problems warning signs were discussed in detail

## 2024-01-01 DIAGNOSIS — F5101 Primary insomnia: Secondary | ICD-10-CM | POA: Diagnosis not present

## 2024-01-01 DIAGNOSIS — F419 Anxiety disorder, unspecified: Secondary | ICD-10-CM | POA: Diagnosis not present

## 2024-01-01 DIAGNOSIS — F331 Major depressive disorder, recurrent, moderate: Secondary | ICD-10-CM | POA: Diagnosis not present

## 2024-01-02 ENCOUNTER — Emergency Department (HOSPITAL_COMMUNITY)
Admission: EM | Admit: 2024-01-02 | Discharge: 2024-01-02 | Disposition: A | Discharge status: Home / Self Care | Source: Skilled Nursing Facility | Attending: Emergency Medicine | Admitting: Emergency Medicine

## 2024-01-02 DIAGNOSIS — N3000 Acute cystitis without hematuria: Secondary | ICD-10-CM | POA: Diagnosis not present

## 2024-01-02 DIAGNOSIS — E119 Type 2 diabetes mellitus without complications: Secondary | ICD-10-CM | POA: Diagnosis not present

## 2024-01-02 DIAGNOSIS — B9689 Other specified bacterial agents as the cause of diseases classified elsewhere: Secondary | ICD-10-CM | POA: Insufficient documentation

## 2024-01-02 DIAGNOSIS — E871 Hypo-osmolality and hyponatremia: Secondary | ICD-10-CM

## 2024-01-02 DIAGNOSIS — N390 Urinary tract infection, site not specified: Secondary | ICD-10-CM | POA: Insufficient documentation

## 2024-01-02 DIAGNOSIS — R531 Weakness: Secondary | ICD-10-CM | POA: Diagnosis not present

## 2024-01-02 DIAGNOSIS — I10 Essential (primary) hypertension: Secondary | ICD-10-CM | POA: Diagnosis not present

## 2024-01-02 DIAGNOSIS — Z7982 Long term (current) use of aspirin: Secondary | ICD-10-CM | POA: Insufficient documentation

## 2024-01-02 DIAGNOSIS — Z7984 Long term (current) use of oral hypoglycemic drugs: Secondary | ICD-10-CM | POA: Diagnosis not present

## 2024-01-02 DIAGNOSIS — R350 Frequency of micturition: Secondary | ICD-10-CM | POA: Diagnosis present

## 2024-01-02 LAB — URINALYSIS, ROUTINE W REFLEX MICROSCOPIC
Bacteria, UA: NONE SEEN
Bilirubin Urine: NEGATIVE
Glucose, UA: NEGATIVE mg/dL
Hgb urine dipstick: NEGATIVE
Ketones, ur: NEGATIVE mg/dL
Nitrite: NEGATIVE
Protein, ur: 30 mg/dL — AB
Specific Gravity, Urine: 1.005 (ref 1.005–1.030)
pH: 6 (ref 5.0–8.0)

## 2024-01-02 LAB — CBC WITH DIFFERENTIAL/PLATELET
Abs Immature Granulocytes: 0.03 K/uL (ref 0.00–0.07)
Basophils Absolute: 0 K/uL (ref 0.0–0.1)
Basophils Relative: 0 %
Eosinophils Absolute: 0.1 K/uL (ref 0.0–0.5)
Eosinophils Relative: 2 %
HCT: 34.9 % — ABNORMAL LOW (ref 36.0–46.0)
Hemoglobin: 11.2 g/dL — ABNORMAL LOW (ref 12.0–15.0)
Immature Granulocytes: 0 %
Lymphocytes Relative: 22 %
Lymphs Abs: 1.5 K/uL (ref 0.7–4.0)
MCH: 28.6 pg (ref 26.0–34.0)
MCHC: 32.1 g/dL (ref 30.0–36.0)
MCV: 89.3 fL (ref 80.0–100.0)
Monocytes Absolute: 0.6 K/uL (ref 0.1–1.0)
Monocytes Relative: 9 %
Neutro Abs: 4.5 K/uL (ref 1.7–7.7)
Neutrophils Relative %: 67 %
Platelets: 309 K/uL (ref 150–400)
RBC: 3.91 MIL/uL (ref 3.87–5.11)
RDW: 13 % (ref 11.5–15.5)
WBC: 6.9 K/uL (ref 4.0–10.5)
nRBC: 0 % (ref 0.0–0.2)

## 2024-01-02 LAB — CULTURE, BLOOD (ROUTINE X 2)
Culture: NO GROWTH
Culture: NO GROWTH
Special Requests: ADEQUATE
Special Requests: ADEQUATE

## 2024-01-02 LAB — LACTIC ACID, PLASMA: Lactic Acid, Venous: 1.7 mmol/L (ref 0.5–1.9)

## 2024-01-02 LAB — COMPREHENSIVE METABOLIC PANEL WITH GFR
ALT: 17 U/L (ref 0–44)
AST: 21 U/L (ref 15–41)
Albumin: 4.1 g/dL (ref 3.5–5.0)
Alkaline Phosphatase: 82 U/L (ref 38–126)
Anion gap: 12 (ref 5–15)
BUN: 14 mg/dL (ref 8–23)
CO2: 23 mmol/L (ref 22–32)
Calcium: 9.2 mg/dL (ref 8.9–10.3)
Chloride: 92 mmol/L — ABNORMAL LOW (ref 98–111)
Creatinine, Ser: 0.81 mg/dL (ref 0.44–1.00)
GFR, Estimated: >60 mL/min (ref >60)
Glucose, Bld: 139 mg/dL — ABNORMAL HIGH (ref 70–99)
Potassium: 3.8 mmol/L (ref 3.5–5.1)
Sodium: 127 mmol/L — ABNORMAL LOW (ref 135–145)
Total Bilirubin: 0.9 mg/dL (ref 0.0–1.2)
Total Protein: 7 g/dL (ref 6.5–8.1)

## 2024-01-02 MED ORDER — PHENAZOPYRIDINE HCL 100 MG PO TABS: 100.0000 mg | ORAL_TABLET | Freq: Three times a day (TID) | ORAL | 0 refills | Status: DC

## 2024-01-02 MED ORDER — ACETAMINOPHEN 325 MG PO TABS
650.0000 mg | ORAL_TABLET | Freq: Once | ORAL | Status: AC
Start: 2024-01-02 — End: 2024-01-02
  Administered 2024-01-02: 650 mg via ORAL
  Filled 2024-01-02: qty 2

## 2024-01-02 MED ORDER — SODIUM CHLORIDE 0.9 % IV BOLUS
1000.0000 mL | Freq: Once | INTRAVENOUS | Status: AC
  Administered 2024-01-02: 1000 mL via INTRAVENOUS

## 2024-01-02 MED ORDER — SULFAMETHOXAZOLE-TRIMETHOPRIM 800-160 MG PO TABS
1.0000 | ORAL_TABLET | Freq: Once | ORAL | Status: AC
Start: 2024-01-02 — End: 2024-01-02
  Administered 2024-01-02: 1 via ORAL
  Filled 2024-01-02: qty 1

## 2024-01-02 MED ORDER — ALPRAZOLAM 0.5 MG PO TABS
0.2500 mg | ORAL_TABLET | Freq: Once | ORAL | Status: AC
Start: 2024-01-02 — End: 2024-01-02
  Administered 2024-01-02: 0.25 mg via ORAL
  Filled 2024-01-02: qty 1

## 2024-01-02 MED ORDER — SULFAMETHOXAZOLE-TRIMETHOPRIM 800-160 MG PO TABS: 1.0000 | ORAL_TABLET | Freq: Two times a day (BID) | ORAL | 0 refills | Status: AC

## 2024-01-02 MED ORDER — PHENAZOPYRIDINE HCL 100 MG PO TABS
100.0000 mg | ORAL_TABLET | Freq: Once | ORAL | Status: AC
  Administered 2024-01-02: 100 mg via ORAL
  Filled 2024-01-02: qty 1

## 2024-01-02 MED ORDER — SULFAMETHOXAZOLE-TRIMETHOPRIM 800-160 MG PO TABS: 1.0000 | ORAL_TABLET | Freq: Two times a day (BID) | ORAL | 0 refills | Status: DC

## 2024-01-02 NOTE — ED Notes (Addendum)
 [facility] contacted to be made aware of discharge. Discharge instructions given to [facility] supervisor at the facility with no further questions at this time.

## 2024-01-02 NOTE — Discharge Instructions (Addendum)
 Was a pleasure taking care of you today.  You were seen in the ER for burning with urination.  You do still have signs of UTI despite being on antibiotics recently.  We are going to switch you to Bactrim .  Follow-up with your PCP and urology.  Your sodium was still somewhat low today at 127.  We gave you IV fluids to help bring this up.  Continue taking your sodium tablets twice per day.  Is important to follow-up closely with your PCP for a recheck.  Certain medications can make your sodium low such as your sertraline . If your levels are still low, they may need to decrease or stop this.

## 2024-01-02 NOTE — ED Provider Notes (Signed)
  EMERGENCY DEPARTMENT AT [facility] Provider Note   CSN: [id number] Arrival date & time: 01/02/24  1417     Patient presents with: Urinary Frequency   [name] is a 89 y.o. female.  She has history of type 2 diabetes, high cholesterol, GERD, frequent UTIs, cognitive dysfunction.  She resides at [facility] assisted living facility.  Presents to the ER today for dysuria and urinary frequency that started yesterday.  She was admitted to the hospital on 6/28 and discharged on 6/30 and begin antibiotics.  She states she was discharged and did not continue any antibiotic.  Notes had stated she supposed to be put on Bactrim  once a day for UTI prevention but she had follow-up with her PCP who stated he did not want her to do that because of risk of C. difficile.  She has had several recent UTIs including hospitalization for this.  She denies fever or chills, no flank pain, no vomiting but she has had some nausea.  She also notes some chills, and also has decreased energy, normally walks a lot but has not had the energy to walk at all the past few days.    Urinary Frequency       Prior to Admission medications   Medication Sig Start Date End Date Taking? Authorizing Provider  acetaminophen  (TYLENOL ) 325 MG tablet TAKE (2) TABLETS BY MOUTH EVERY SIX HOURS AS NEEDED FOR PAIN. MAX 3GM IN 24 HOURS. Patient taking differently: Take 650 mg by mouth every 6 (six) hours as needed for mild pain (pain score 1-3). 07/28/23   [name], MD  albuterol  (VENTOLIN  HFA) 108 (90 Base) MCG/ACT inhaler INHALE 2 PUFFS INTO THE LUNGS EVERY SIX HOURS AS NEEDED FOR WHEEZING OR SHORTNESS OF BREATH. 09/26/23   [name], MD  ALPRAZolam  (XANAX ) 0.25 MG tablet Take 1 tablet (0.25 mg total) by mouth at bedtime. 12/29/23   Maree, Pratik D, DO  amLODipine  (NORVASC ) 10 MG tablet TAKE (1) TABLET BY MOUTH ONCE DAILY. Patient taking differently: Take 10 mg by mouth daily. 10/07/23   [name],  MD  aspirin  (ASPIRIN  LOW DOSE) 81 MG chewable tablet CHEW (1) TABLET BY MOUTH ONCE DAILY. Patient taking differently: Chew 81 mg by mouth daily. 09/06/23   [name], MD  beta carotene (BETA CAROTENE PROVITAMIN A) 25000 UNIT capsule TAKE (1) CAPSULE BY MOUTH AT BEDTIME. Patient taking differently: Take 25,000 Units by mouth at bedtime. 10/07/23   [name], MD  BISACODYL  5 MG EC tablet TAKE 1 TABLET BY MOUTH ONCE DAILY AS NEEDED FOR MODERATE CONSTIPATION. Patient taking differently: Take 5 mg by mouth daily as needed for moderate constipation. 12/20/22   [name], MD  cyanocobalamin  1000 MCG tablet Take 1,000 mcg by mouth at bedtime.    [provider]  cycloSPORINE  (RESTASIS ) 0.05 % ophthalmic emulsion Place 1 drop into both eyes 2 (two) times daily.    [provider]  dicyclomine  (BENTYL ) 10 MG capsule TAKE 1 CAPSULE BY MOUTH TWICE DAILY AS NEEDED FOR ABDOMINAL CRAMPS AND LOOSE STOOL. Patient taking differently: Take 10 mg by mouth 2 (two) times daily as needed (abdominal cramps and loose stool). TAKE 1 CAPSULE BY MOUTH TWICE DAILY AS NEEDED FOR ABDOMINAL CRAMPS AND LOOSE STOOL. 11/21/22   [name], MD  ferrous sulfate  (FEROSUL) 325 (65 FE) MG tablet TAKE (1) TABLET BY MOUTH EVERY OTHER DAY. Patient taking differently: Take 325 mg by mouth every other  day. 10/07/23   [name], MD  glucose blood (EASYMAX TEST) test strip CHECK BLOOD SUGAR ONCE DAILY.(CALL MD IF BS BELOW 60: OR IF BS ABOVE 400) 10/07/23   Luking, Glendia LABOR, MD  hydrALAZINE  (APRESOLINE ) 25 MG tablet Take 1 tablet (25 mg total) by mouth 3 (three) times daily. 12/31/23   Luking, Glendia LABOR, MD  IVIZIA DRY EYES 0.5 % SOLN Place 1 drop into both eyes 2 (two) times daily. 09/22/23   [provider]  loratadine  (ALLERGY RELIEF) 10 MG tablet TAKE (1) TABLET BY MOUTH ONCE DAILY. Patient taking differently: Take 10 mg by mouth daily. 10/07/23   [name], MD  Melatonin 10 MG TABS Take 10 mg by  mouth at bedtime. 09/25/23   Cook, Jayce G, DO  metFORMIN  (GLUCOPHAGE ) 500 MG tablet TAKE (1) TABLET BY MOUTH IN THE MORNING & (1/2) TABLET (250MG ) BY MOUTH AT SUPPER Patient taking differently: Take 500 mg by mouth 2 (two) times daily with a meal. TAKE (1) TABLET BY MOUTH IN THE MORNING & (1/2) TABLET (250MG ) BY MOUTH AT SUPPER 10/07/23   Luking, Glendia LABOR, MD  Multiple Vitamins-Minerals (PRESERVISION AREDS 2) CAPS Take 1 capsule by mouth in the morning and at bedtime.    [provider]  ondansetron  (ZOFRAN -ODT) 4 MG disintegrating tablet 4mg  ODT q4 hours prn nausea/vomit Patient taking differently: Take 4 mg by mouth every 4 (four) hours as needed for nausea or vomiting. 09/05/23   Zammit, Joseph, MD  pantoprazole  (PROTONIX ) 40 MG tablet Take 1 tablet (40 mg total) by mouth 2 (two) times daily before a meal. 12/24/23   Cook, Jayce G, DO  polyethylene glycol powder (GOODSENSE CLEARLAX) 17 GM/SCOOP powder 1 scoop in 8 ounces water  once daily as needed for constipation Patient taking differently: Take 17 g by mouth daily as needed for mild constipation. 1 scoop in 8 ounces water  once daily as needed for constipation 06/03/23   Alphonsa Glendia A, MD  sertraline  (ZOLOFT ) 25 MG tablet TAKE (1) TABLET BY MOUTH ONCE DAILY. Patient taking differently: Take 25 mg by mouth daily. 10/07/23   [name], MD  sodium chloride  1 g tablet Take 1 tablet (1 g total) by mouth 2 (two) times daily with a meal. 12/29/23   Maree, Pratik D, DO  tamsulosin  (FLOMAX ) 0.4 MG CAPS capsule Take 1 capsule (0.4 mg total) by mouth daily. 12/29/23   Maree, Pratik D, DO  valsartan  (DIOVAN ) 320 MG tablet Take 1 tablet (320 mg total) by mouth daily. 12/22/23   [name], MD  Vitamin D , Cholecalciferol , 10 MCG (400 UNIT) TABS TAKE (1) TABLET BY MOUTH ONCE DAILY. Patient taking differently: Take 1 tablet by mouth daily. 10/07/23   [name], MD    Allergies: Statins, Phenergan [promethazine hcl], Hydrocortisone, Lisinopril,  Prednisone , and Trazodone  and nefazodone    Review of Systems  Genitourinary:  Positive for frequency.    Updated Vital Signs BP (!) 162/58   Temp (!) 97.5 F (36.4 C) (Oral)   Ht 5' 8 (1.727 m)   Wt 78.9 kg   BMI 26.46 kg/m   Physical Exam Vitals and nursing note reviewed.  Constitutional:      General: She is not in acute distress.    Appearance: She is well-developed.  HENT:     Head: Normocephalic and atraumatic.     Nose: Nose normal.     Mouth/Throat:     Mouth: Mucous membranes are moist.  Eyes:  Extraocular Movements: Extraocular movements intact.     Conjunctiva/sclera: Conjunctivae normal.     Pupils: Pupils are equal, round, and reactive to light.  Cardiovascular:     Rate and Rhythm: Normal rate and regular rhythm.     Heart sounds: No murmur heard. Pulmonary:     Effort: Pulmonary effort is normal. No respiratory distress.     Breath sounds: Normal breath sounds.  Abdominal:     Palpations: Abdomen is soft.     Tenderness: There is no abdominal tenderness.  Musculoskeletal:        General: No swelling.     Cervical back: Neck supple.  Skin:    General: Skin is warm and dry.     Capillary Refill: Capillary refill takes less than 2 seconds.  Neurological:     General: No focal deficit present.     Mental Status: She is alert and oriented to person, place, and time.     Sensory: No sensory deficit.     Motor: No weakness.     Coordination: Coordination normal.  Psychiatric:        Mood and Affect: Mood normal.     (all labs ordered are listed, but only abnormal results are displayed) Labs Reviewed  COMPREHENSIVE METABOLIC PANEL WITH GFR  LACTIC ACID, PLASMA  LACTIC ACID, PLASMA  CBC WITH DIFFERENTIAL/PLATELET  URINALYSIS, ROUTINE W REFLEX MICROSCOPIC    EKG: None  Radiology: No results found.   Procedures   Medications Ordered in the ED - No data to display                                  Medical Decision Making This  patient presents to the ED for concern of dysuria, urinary frequency, decrease in energy, this involves an extensive number of treatment options, and is a complaint that carries with it a high risk of complications and morbidity.  The differential diagnosis includes UTI, sepsis, kidney stone, electrolyte derangement, dehydration, other   Co morbidities that complicate the patient evaluation :   Recurrent UTI, diabetes, GERD, aortic atherosclerosis   Additional history obtained:  Additional history obtained from EMR External records from outside source obtained and reviewed including prior notes and labs   Lab Tests:  I Ordered, and personally interpreted labs.  The pertinent results include: CBC shows no leukocytosis or significant anemia, lactic acid is normal, CMP with sodium 127, chloride 92, otherwise normal, UA shows trace leukocytes, 16 white blood cells     Problem List / ED Course / Critical interventions / Medication management  UTI-patient recently treated for UTI, which grew out as Klebsiella on culture, with ciprofloxacin .  She recently finished this course but states she never got completely better and started feeling worse again yesterday.  Having frequency and dysuria and has less energy.  She has not had a fever, no nausea or vomiting, no chest pain or shortness of breath or other symptoms, no abdominal pain.  She is not having flank pain manage no CVA tenderness.  She was supposed to start once daily Bactrim  for UTI prevention but on consultation with her PCP they decided not to due to risk of recurrent C. difficile. Patient is well-appearing, well-hydrated, she has no neurologic deficits, her exam is reassuring.  Labs ordered due to her generalized malaise and do reveal sodium of 127, patient had been hyponatremic on her recent admission on 6/28 as well as 120.  They have her on salt tablets twice daily which she has been compliant with.  She is given IV fluids for this, I  discussed with her as well as with her son via telephone this finding, advised to continue the twice daily sodium tablets and have close recheck.  We discussed that if her sodium has not normalized they may need to look at her medication list, noting that she is on sertraline  which could be because of hyponatremia in elderly patient.  She is having ongoing UTI symptoms, urine has trace leukocytes and 6-10 white blood cells, given that she just finished ciprofloxacin  is having worsening symptoms we will restart her on Bactrim  pending urine culture.  Advised on close follow-up and strict return precautions.  Patient requesting referral to urology, states she is seeing Dr. Sherrilee previously would like to go back to him.   I ordered medication including IV fluids, Tylenol , pyridium  for UTI  Reevaluation of the patient after these medicines showed that the patient improved I have reviewed the patients home medicines and have made adjustments as needed   Social Determinants of Health:  Patient lives in assisted living   Test / Admission - Considered:  Considered need for hospitalization, at this time patient does not meet criteria, she was able to ambulate with her walker well and is feeling much better.  Patient safe for discharge at this time.    Amount and/or Complexity of Data Reviewed Labs: ordered.  Risk OTC drugs. Prescription drug management.        Final diagnoses:  None    ED Discharge Orders     None          Suellen Sherran DELENA DEVONNA 01/02/24 1900    Francesca Elsie CROME, MD 01/02/24 2223

## 2024-01-02 NOTE — ED Notes (Signed)
 Staff from [facility] here to pick up patient for discharge.  Patient given discharge instruction.  Staff was called and report given to [place] prior to discharge

## 2024-01-02 NOTE — ED Triage Notes (Signed)
 Patient BIB [facility] from [facility] with complaint of painful urination and urinary frequency, patient states she was diagnosed with a UTI last week and was given antibiotics during her hospital stay but no antibiotics prescription. Afebrile alert and oriented x4.

## 2024-01-03 LAB — URINE CULTURE: Culture: <10000 — AB

## 2024-01-07 DIAGNOSIS — M6281 Muscle weakness (generalized): Secondary | ICD-10-CM | POA: Diagnosis not present

## 2024-01-09 DIAGNOSIS — R278 Other lack of coordination: Secondary | ICD-10-CM | POA: Diagnosis not present

## 2024-01-09 DIAGNOSIS — M6281 Muscle weakness (generalized): Secondary | ICD-10-CM | POA: Diagnosis not present

## 2024-01-12 DIAGNOSIS — R278 Other lack of coordination: Secondary | ICD-10-CM | POA: Diagnosis not present

## 2024-01-12 DIAGNOSIS — M6281 Muscle weakness (generalized): Secondary | ICD-10-CM | POA: Diagnosis not present

## 2024-01-13 ENCOUNTER — Ambulatory Visit: Admitting: Family Medicine

## 2024-01-13 VITALS — HR 81 | Temp 98.4°F | Ht 68.0 in

## 2024-01-13 DIAGNOSIS — N390 Urinary tract infection, site not specified: Secondary | ICD-10-CM

## 2024-01-13 DIAGNOSIS — R070 Pain in throat: Secondary | ICD-10-CM

## 2024-01-13 DIAGNOSIS — E871 Hypo-osmolality and hyponatremia: Secondary | ICD-10-CM | POA: Diagnosis not present

## 2024-01-13 DIAGNOSIS — R109 Unspecified abdominal pain: Secondary | ICD-10-CM | POA: Diagnosis not present

## 2024-01-13 DIAGNOSIS — R350 Frequency of micturition: Secondary | ICD-10-CM

## 2024-01-13 MED ORDER — SUCRALFATE 1 G PO TABS: ORAL_TABLET | ORAL | 0 refills | Status: DC

## 2024-01-13 NOTE — Progress Notes (Signed)
   Subjective:    Patient ID: ARLISSA MONTEVERDE, female    DOB: December 20, 1931, 89 y.o.   MRN: [id number]  HPI Pt comes in today for Hospital Follow up. PT states she has a weird feeling in her throat  if she doesn't keep a cough drop in it feels like she looses her voice. Pt also wanted us  to be aware she has a Urology appt on Thursday. Medication and allergies reviewed. History of Present Illness   The patient is a 89 year old who presents with abdominal burning and throat discomfort.  She experiences a burning sensation in the abdomen and chest approximately two to three times a week, lasting all day and significantly impacting her ability to function. She is on pantoprazole  twice daily for acid reflux, but additional over-the-counter remedies like Tums do not provide relief. No vomiting, but occasional nausea is noted.  Throat discomfort has been present for about a month to six weeks, affecting speech and causing coughing and watery eyes. The discomfort is localized to one side of the throat and is somewhat alleviated by using sugar-free cough drops throughout the day. No difficulty swallowing food is reported.  Blood sugar levels have been fluctuating, with a reading of 121 mg/dL yesterday and 858 mg/dL today, attributed to dietary choices such as eating a tomato sandwich before bed. She monitors her blood sugar regularly.  She has a history of urinary tract infections and was previously treated with Bactrim  during a hospital stay. She takes sodium tablets and is aware of the need for follow-up blood work to monitor sodium levels.  She has a stent and needs to take Imodium  approximately every other week to manage bowel movements. She reports eating three meals a day and enjoys tomatoes, which are provided by neighbors. She has recently acquired new diabetic shoes.       Review of Systems     Objective:   Physical Exam  General-in no acute distress Eyes-no discharge Lungs-respiratory rate  normal, CTA CV-no murmurs,RRR Extremities skin warm dry no edema Neuro grossly normal Behavior normal, alert       Assessment & Plan:  1. Frequency of urination (Primary) Patient will be seeing urology in the near future  2. Hyponatremia Salt tablets recently added recheck metabolic 7 with urine testing in approximately 1 week  3. Throat discomfort Referral to ENT for further evaluation  4. Abdominal pain, unspecified abdominal location Referral to gastroenterology for follow-up had previous stents placed  5. Frequent UTI I do not find evidence of a UTI currently but will have patient see urology as planned later this week  Follow-up in 4 to 5 weeks as planned

## 2024-01-15 ENCOUNTER — Encounter: Payer: Self-pay | Admitting: Urology

## 2024-01-15 ENCOUNTER — Ambulatory Visit: Admitting: Urology

## 2024-01-15 ENCOUNTER — Telehealth: Payer: Self-pay

## 2024-01-15 VITALS — BP 187/71 | HR 87

## 2024-01-15 DIAGNOSIS — R339 Retention of urine, unspecified: Secondary | ICD-10-CM

## 2024-01-15 DIAGNOSIS — Z1624 Resistance to multiple antibiotics: Secondary | ICD-10-CM | POA: Diagnosis not present

## 2024-01-15 DIAGNOSIS — Z8619 Personal history of other infectious and parasitic diseases: Secondary | ICD-10-CM | POA: Diagnosis not present

## 2024-01-15 DIAGNOSIS — Z87440 Personal history of urinary (tract) infections: Secondary | ICD-10-CM | POA: Diagnosis not present

## 2024-01-15 DIAGNOSIS — R350 Frequency of micturition: Secondary | ICD-10-CM | POA: Diagnosis not present

## 2024-01-15 DIAGNOSIS — B351 Tinea unguium: Secondary | ICD-10-CM | POA: Diagnosis not present

## 2024-01-15 DIAGNOSIS — N3000 Acute cystitis without hematuria: Secondary | ICD-10-CM | POA: Diagnosis not present

## 2024-01-15 DIAGNOSIS — E871 Hypo-osmolality and hyponatremia: Secondary | ICD-10-CM | POA: Diagnosis not present

## 2024-01-15 DIAGNOSIS — Z09 Encounter for follow-up examination after completed treatment for conditions other than malignant neoplasm: Secondary | ICD-10-CM

## 2024-01-15 DIAGNOSIS — E1142 Type 2 diabetes mellitus with diabetic polyneuropathy: Secondary | ICD-10-CM | POA: Diagnosis not present

## 2024-01-15 DIAGNOSIS — N390 Urinary tract infection, site not specified: Secondary | ICD-10-CM | POA: Diagnosis not present

## 2024-01-15 DIAGNOSIS — L84 Corns and callosities: Secondary | ICD-10-CM | POA: Diagnosis not present

## 2024-01-15 LAB — URINALYSIS, ROUTINE W REFLEX MICROSCOPIC
Bilirubin, UA: NEGATIVE
Glucose, UA: NEGATIVE
Ketones, UA: NEGATIVE
Nitrite, UA: NEGATIVE
RBC, UA: NEGATIVE
Specific Gravity, UA: 1.015 (ref 1.005–1.030)
Urobilinogen, Ur: 0.2 mg/dL (ref 0.2–1.0)
pH, UA: 7 (ref 5.0–7.5)

## 2024-01-15 LAB — MICROSCOPIC EXAMINATION

## 2024-01-15 LAB — BLADDER SCAN AMB NON-IMAGING
Scan Result: 140
Scan Result: 243

## 2024-01-15 MED ORDER — METHENAMINE HIPPURATE 1 G PO TABS: 1.0000 g | ORAL_TABLET | Freq: Two times a day (BID) | ORAL | 11 refills | Status: DC

## 2024-01-15 MED ORDER — ESTRADIOL 0.1 MG/GM VA CREA: TOPICAL_CREAM | VAGINAL | 3 refills | Status: DC

## 2024-01-15 NOTE — Progress Notes (Signed)
 Name: [name] DOB: Jul 16, 1931 MRN: [id number]  History of Present Illness: [name] is a 89 y.o. female who presents today for follow up visit at [facility] [place].  Relevant History includes: 1. Recurrent UTI.  Urine culture results in past 12 months: - 03/10/2023: Negative  - 03/22/2023: Positive for Morganella morganii and Klebsiella pneumoniae - 08/14/2023: Positive for Enterococcus faecalis (VRE) - 08/25/2023: Positive for Klebsiella pneumoniae (MDRO) - 09/14/2023: Negative - 10/08/2023: Positive for Klebsiella pneumoniae (MDRO) - 12/24/2023: Positive for Klebsiella pneumoniae (MDRO) - 01/02/2024: Negative   At 1st visit with Dr. Matilda on 05/20/2023: Asymptomatic. Seen for history of hyponatremia and UTI.   Recent history:  > 09/22/2023: CT abdomen/pelvis w/ contrast showed no GU stones, masses, or hydronephrosis; bladder unremarkable. Small bilateral renal cysts.   > 12/24/2023: Urine culture positive for >100k Klebsiella pneumoniae (MDRO) and 10-25k Pseudomonas aeruginosa.   > 12/27/2023 - 12/29/2023: Admitted for UTI. She is known to have Klebsiella and Pseudomonas growth in her urine cultures that is sensitive to fluoroquinolones and she will remain on ciprofloxacin  for 2 more days to complete total 3-day course of treatment after having been empirically started on IV Rocephin  which she is resistant to. She will then remain on Bactrim  DS daily for UTI prophylaxis and has been prescribed increased doses of sodium chloride  tablets to help with her hyponatremia and will need recheck of her BMP. Finally, she has been started on Flomax  for her urinary retention issues and hopefully this will help to prevent further frequency of her UTIs.  > 01/02/2024:  - Seen in ER for dysuria and urinary frequency that started yesterday.  She was admitted to the hospital on 6/28 and discharged on 6/30 and begin antibiotics.  She states she was discharged and did not  continue any antibiotic.  Notes had stated she supposed to be put on Bactrim  once a day for UTI prevention but she had follow-up with her PCP who stated he did not want her to do that because of risk of C. difficile.  She has had several recent UTIs including hospitalization for this.  She denies fever or chills, no flank pain, no vomiting but she has had some nausea.  She also notes some chills, and also has decreased energy, normally walks a lot but has not had the energy to walk at all the past few days. - CBC shows no leukocytosis or significant anemia, lactic acid is normal, CMP with sodium 127, chloride 92, otherwise normal, UA shows trace leukocytes, 16 white blood cells  - Treated for hyponatremia (advised to continue the twice daily sodium tablets and have close recheck). - Restarted on Bactrim .  Today: She reports that she has follow up blood work with her PCP next week to monitor her sodium.   She denies acute UTI symptoms today. Denies urinary urgency, frequency, dysuria, gross hematuria, hesitancy, straining to void, sensations of incomplete emptying. Denies fevers, weakness, fatigue, nausea, or vomiting.   Medications: Current Outpatient Medications  Medication Sig Dispense Refill   estradiol  (ESTRACE ) 0.1 MG/GM vaginal cream Discard plastic applicator. Insert a blueberry size amount (approximately 1 gram) of cream on fingertip inside vagina at bedtime every night for 1 week then every other night. For long term use. 30 g 3   methenamine  (HIPREX ) 1 g tablet Take 1 tablet (1 g total) by mouth 2 (two) times daily with a meal. Most effective when taken with a daily Vitamin C supplement. 60 tablet 11   acetaminophen  (  TYLENOL ) 325 MG tablet TAKE (2) TABLETS BY MOUTH EVERY SIX HOURS AS NEEDED FOR PAIN. MAX 3GM IN 24 HOURS. (Patient taking differently: Take 650 mg by mouth every 6 (six) hours as needed for mild pain (pain score 1-3).) 120 tablet 5   albuterol  (VENTOLIN  HFA) 108 (90 Base)  MCG/ACT inhaler INHALE 2 PUFFS INTO THE LUNGS EVERY SIX HOURS AS NEEDED FOR WHEEZING OR SHORTNESS OF BREATH. 8.5 g 1   ALPRAZolam  (XANAX ) 0.25 MG tablet Take 1 tablet (0.25 mg total) by mouth at bedtime. 10 tablet 0   amLODipine  (NORVASC ) 10 MG tablet TAKE (1) TABLET BY MOUTH ONCE DAILY. (Patient taking differently: Take 10 mg by mouth daily.) 30 tablet 5   aspirin  (ASPIRIN  LOW DOSE) 81 MG chewable tablet CHEW (1) TABLET BY MOUTH ONCE DAILY. (Patient taking differently: Chew 81 mg by mouth daily.) 30 tablet 12   beta carotene (BETA CAROTENE PROVITAMIN A) 25000 UNIT capsule TAKE (1) CAPSULE BY MOUTH AT BEDTIME. (Patient taking differently: Take 25,000 Units by mouth at bedtime.) 30 capsule 3   BISACODYL  5 MG EC tablet TAKE 1 TABLET BY MOUTH ONCE DAILY AS NEEDED FOR MODERATE CONSTIPATION. (Patient taking differently: Take 5 mg by mouth daily as needed for moderate constipation.) 30 tablet 0   ciprofloxacin  (CIPRO ) 500 MG tablet Take 500 mg by mouth 2 (two) times daily.     cyanocobalamin  1000 MCG tablet Take 1,000 mcg by mouth at bedtime.     cycloSPORINE  (RESTASIS ) 0.05 % ophthalmic emulsion Place 1 drop into both eyes 2 (two) times daily.     dicyclomine  (BENTYL ) 10 MG capsule TAKE 1 CAPSULE BY MOUTH TWICE DAILY AS NEEDED FOR ABDOMINAL CRAMPS AND LOOSE STOOL. (Patient taking differently: Take 10 mg by mouth 2 (two) times daily as needed (abdominal cramps and loose stool). TAKE 1 CAPSULE BY MOUTH TWICE DAILY AS NEEDED FOR ABDOMINAL CRAMPS AND LOOSE STOOL.) 60 capsule 0   ferrous sulfate  (FEROSUL) 325 (65 FE) MG tablet TAKE (1) TABLET BY MOUTH EVERY OTHER DAY. (Patient not taking: Reported on 01/13/2024) 15 tablet 5   glucose blood (EASYMAX TEST) test strip CHECK BLOOD SUGAR ONCE DAILY.(CALL MD IF BS BELOW 60: OR IF BS ABOVE 400) 50 strip 3   hydrALAZINE  (APRESOLINE ) 25 MG tablet Take 1 tablet (25 mg total) by mouth 3 (three) times daily. 90 tablet 5   IVIZIA DRY EYES 0.5 % SOLN Place 1 drop into both eyes  2 (two) times daily.     loratadine  (ALLERGY RELIEF) 10 MG tablet TAKE (1) TABLET BY MOUTH ONCE DAILY. (Patient taking differently: Take 10 mg by mouth daily.) 30 tablet 5   Melatonin 10 MG TABS Take 10 mg by mouth at bedtime. 30 tablet 3   metFORMIN  (GLUCOPHAGE ) 500 MG tablet TAKE (1) TABLET BY MOUTH IN THE MORNING & (1/2) TABLET (250MG ) BY MOUTH AT SUPPER (Patient taking differently: Take 500 mg by mouth 2 (two) times daily with a meal. TAKE (1) TABLET BY MOUTH IN THE MORNING & (1/2) TABLET (250MG ) BY MOUTH AT SUPPER) 45 tablet 5   Multiple Vitamins-Minerals (PRESERVISION AREDS 2) CAPS Take 1 capsule by mouth in the morning and at bedtime.     ondansetron  (ZOFRAN -ODT) 4 MG disintegrating tablet 4mg  ODT q4 hours prn nausea/vomit (Patient not taking: Reported on 01/13/2024) 10 tablet 0   pantoprazole  (PROTONIX ) 40 MG tablet Take 1 tablet (40 mg total) by mouth 2 (two) times daily before a meal. 60 tablet 3   phenazopyridine  (PYRIDIUM ) 100 MG  tablet Take 1 tablet (100 mg total) by mouth 3 (three) times daily. 6 tablet 0   polyethylene glycol powder (GOODSENSE CLEARLAX) 17 GM/SCOOP powder 1 scoop in 8 ounces water  once daily as needed for constipation (Patient taking differently: Take 17 g by mouth daily as needed for mild constipation. 1 scoop in 8 ounces water  once daily as needed for constipation) 510 g 1   sertraline  (ZOLOFT ) 25 MG tablet TAKE (1) TABLET BY MOUTH ONCE DAILY. (Patient taking differently: Take 25 mg by mouth daily.) 30 tablet 5   sodium chloride  1 g tablet Take 1 tablet (1 g total) by mouth 2 (two) times daily with a meal. 60 tablet 1   sucralfate  (CARAFATE ) 1 g tablet Crush and mix with 30 ml of water  three times a day for 2 weeks 42 tablet 0   tamsulosin  (FLOMAX ) 0.4 MG CAPS capsule Take 1 capsule (0.4 mg total) by mouth daily. 30 capsule 2   valsartan  (DIOVAN ) 320 MG tablet Take 1 tablet (320 mg total) by mouth daily. 30 tablet 5   Vitamin D , Cholecalciferol , 10 MCG (400 UNIT) TABS  TAKE (1) TABLET BY MOUTH ONCE DAILY. (Patient taking differently: Take 1 tablet by mouth daily.) 32 tablet 5   No current facility-administered medications for this visit.    Allergies: Allergies  Allergen Reactions   Statins Other (See Comments)    Severe myalgias   Phenergan [Promethazine Hcl] Other (See Comments)    Hallucinations    Hydrocortisone Itching   Lisinopril Other (See Comments)    Lethargy, Fatigue   Prednisone  Rash   Trazodone  And Nefazodone Cough    Past Medical History:  Diagnosis Date   Blood transfusion without reported diagnosis    Cognitive dysfunction 03/04/2019   Patient scores 21 out of 30 on [place] cognitive assessment September 2020   Diabetes mellitus without complication (HCC)    Diabetic peripheral neuropathy associated with type 2 diabetes mellitus (HCC) 02/25/2022   Diverticulitis    Frailty 03/04/2019   H/O bilateral breast reduction surgery    Hyperlipidemia    a. intolerant to statins.    Hypertension    Past Surgical History:  Procedure Laterality Date   ABDOMINAL HYSTERECTOMY  2003   APPENDECTOMY     age 76   BIOPSY  04/10/2023   Procedure: BIOPSY;  Surgeon: Cinderella Deatrice FALCON, MD;  Location: [facility];  Service: Endoscopy;;   BREAST REDUCTION SURGERY     age 62   COLON SURGERY Left 2011   Hemicolectomy due to diverticulitis   ESOPHAGOGASTRODUODENOSCOPY (EGD) WITH PROPOFOL  N/A 04/10/2023   Procedure: ESOPHAGOGASTRODUODENOSCOPY (EGD) WITH PROPOFOL ;  Surgeon: Cinderella Deatrice FALCON, MD;  Location: [facility];  Service: Endoscopy;  Laterality: N/A;   EYE SURGERY  03/30/2009   cataract   IR ANGIOGRAM VISCERAL SELECTIVE  04/11/2023   IR INTRAVASCULAR ULTRASOUND NON CORONARY  04/11/2023   IR TRANSCATH PLC STENT 1ST ART NOT LE CV CAR VERT CAR  04/11/2023   IR US  GUIDE VASC ACCESS RIGHT  04/11/2023   KNEE SURGERY Right 2003   knee cap   LAPAROSCOPIC INCISIONAL / UMBILICAL / VENTRAL HERNIA REPAIR  02/23/2007   REDUCTION MAMMAPLASTY  Bilateral 2001   REFRACTIVE SURGERY     TOTAL KNEE ARTHROPLASTY Right 04/10/2022   Procedure: TOTAL KNEE ARTHROPLASTY;  Surgeon: Gerome Charleston, MD;  Location: [facility];  Service: Orthopedics;  Laterality: Right;  adductor canal   Family History  Problem Relation Age of Onset   Cancer Mother  ovaian   Hypertension Father        kidney   Stroke Father    Cancer Father    Hypertension Sister    Diabetes Brother    Social History   Socioeconomic History   Marital status: Widowed    Spouse name: Not on file   Number of children: Not on file   Years of education: Not on file   Highest education level: Not on file  Occupational History   Not on file  Tobacco Use   Smoking status: Never   Smokeless tobacco: Never  Vaping Use   Vaping status: Never Used  Substance and Sexual Activity   Alcohol  use: No   Drug use: No   Sexual activity: Never    Birth control/protection: Abstinence  Other Topics Concern   Not on file  Social History Narrative   Not on file   Social Drivers of Health   Financial Resource Strain: Low Risk  (09/06/2022)   Overall Financial Resource Strain (CARDIA)    Difficulty of Paying Living Expenses: Not hard at all  Food Insecurity: No Food Insecurity (12/27/2023)   Hunger Vital Sign    Worried About Running Out of Food in the Last Year: Never true    Ran Out of Food in the Last Year: Never true  Transportation Needs: No Transportation Needs (12/27/2023)   PRAPARE - Administrator, Civil Service (Medical): No    Lack of Transportation (Non-Medical): No  Physical Activity: Sufficiently Active (09/06/2022)   Exercise Vital Sign    Days of Exercise per Week: 5 days    Minutes of Exercise per Session: 30 min  Stress: No Stress Concern Present (09/06/2022)   [employer] of Occupational Health - Occupational Stress Questionnaire    Feeling of Stress : Not at all  Social Connections: Moderately Isolated (12/27/2023)   Social Connection and  Isolation Panel    Frequency of Communication with Friends and Family: More than three times a week    Frequency of Social Gatherings with Friends and Family: More than three times a week    Attends Religious Services: 1 to 4 times per year    Active Member of [employer] or Organizations: No    Attends Banker Meetings: Never    Marital Status: Widowed  Intimate Partner Violence: Not At Risk (12/27/2023)   Humiliation, Afraid, Rape, and Kick questionnaire    Fear of Current or Ex-Partner: No    Emotionally Abused: No    Physically Abused: No    Sexually Abused: No    Review of Systems Constitutional: Patient denies any unintentional weight loss or change in strength lntegumentary: Patient denies any rashes or pruritus Cardiovascular: Patient denies chest pain or syncope Respiratory: Patient denies shortness of breath Gastrointestinal: Patient denies nausea, vomiting, constipation, or diarrhea  Musculoskeletal: Patient denies muscle cramps or weakness Neurologic: Patient denies convulsions or seizures Allergic/Immunologic: Patient denies recent allergic reaction(s) Hematologic/Lymphatic: Patient denies bleeding tendencies Endocrine: Patient denies heat/cold intolerance  GU: As per HPI.  OBJECTIVE Vitals:   01/15/24 1309  BP: (!) 187/71  Pulse: 87   There is no height or weight on file to calculate BMI.  Physical Examination Constitutional: No obvious distress; patient is non-toxic appearing  Cardiovascular: No visible lower extremity edema.  Respiratory: The patient does not have audible wheezing/stridor; respirations do not appear labored  Gastrointestinal: Abdomen non-distended Musculoskeletal: Normal ROM of UEs  Skin: No obvious rashes/open sores  Neurologic: CN 2-12 grossly intact  Psychiatric: Answered questions appropriately with normal affect  Hematologic/Lymphatic/Immunologic: No obvious bruises or sites of spontaneous bleeding  Urine microscopy: 6-10  WBC/hpf, 0-2 RBC/hpf, few bacteria PVR: 140 ml  ASSESSMENT Hospital discharge follow-up - Plan: methenamine  (HIPREX ) 1 g tablet  Incomplete bladder emptying - Plan: BLADDER SCAN AMB NON-IMAGING, Urinalysis, Routine w reflex microscopic, methenamine  (HIPREX ) 1 g tablet  Acute cystitis without hematuria - Plan: methenamine  (HIPREX ) 1 g tablet  Hyponatremia - Plan: methenamine  (HIPREX ) 1 g tablet  Frequency of urination - Plan: Urinalysis, Routine w reflex microscopic, methenamine  (HIPREX ) 1 g tablet  Recurrent UTI - Plan: Urinalysis, Routine w reflex microscopic, estradiol  (ESTRACE ) 0.1 MG/GM vaginal cream, methenamine  (HIPREX ) 1 g tablet  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 - Plan: methenamine  (HIPREX ) 1 g tablet  History of infection with vancomycin  resistant Enterococcus (VRE) - Plan: methenamine  (HIPREX ) 1 g tablet  We discussed the possible etiologies of recurrent UTls including ascending infection related to intercourse; vaginal atrophy; transmural infection that has been treated incompletely; urinary tract stones; incomplete bladder emptying with urinary stasis; kidney or bladder tumor; urethral diverticulum; and colonization of  vagina and urinary tract with pathologic, adherent organisms.   For UTI prevention we discussed the following options, for which detailed information has been included in Patient Information section of today's After Visit Summary. > Maintain adequate fluid intake daily to flush out the urinary tract. > Go to the bathroom to urinate every 4-6 hours while awake to minimize urinary stasis / bacterial overgrowth in the bladder. > Proanthocyanidin (PAC) supplement 36 mg daily; must be soluble (insoluble form of PAC will be ineffective). Recommend Ellura.  > Topical vaginal estrogen for vaginal atrophy.  We discussed the following: - The etiology and consequences of urogenital epithelial atrophy was explained to patient. The thinning of the  epithelium of the urethra can contribute to urinary urgency and frequency syndromes. In addition, the normal bacterial flora that colonizes the perineum may contribute to UTI risk because the thin urethral epithelium allows the bacteria to become adherent and the change in vaginal pH can disrupt the vaginal / urethral microbiome and allow for bacterial overgrowth. - The normal bacterial flora that colonizes the perineum may contribute to UTI risk because the thin urethral epithelium allows the bacteria to become adherent and the change in vaginal pH can disrupt the vaginal / urethral microbiome and allow for bacterial overgrowth.  - Topical vaginal estrogen replacement will take about 3 months to restore the vaginal pH. - Topical vaginal replacement may sting/burn initially due to severe dryness, which will improve with ongoing treatment as the tissue gets healthier.  - OK to have sex with any of the topical vaginal estrogen replacement options.  - Studies have demonstrated negligible systemic absorption of low-dose intravaginal estrogen cream therefore the risk for cancer development or recurrence with this medication is minimal. > UTI prophylaxis with a daily low dose antibiotic.   OK to continue Flomax  (Tamsulosin ) 0.4 mg daily for incomplete bladder emptying. She had no hydronephrosis on recent CT imaging.   We agreed to plan for follow up in 3 months or sooner if needed. Patient verbalized understanding of and agreement with current plan. All questions were answered.  PLAN Advised the following: 1. Continue Flomax  (Tamsulosin ) 0.4 mg daily. 2. Start Hiprex  (Methenamine  hippurate) 2x/day for UTI prophylaxis 3. Start topical vaginal estrogen cream as prescribed. 4. Maintain adequate fluid intake daily to flush out the urinary tract. 5. Urinate every 4-6 hours while awake to minimize  urinary stasis / bacterial overgrowth in the bladder. 6. Consider OTC supplements for UTI prevention (handout  provided). 7. Return in about 3 months (around 04/16/2024) for Recurrent UTI, with UA & PVR.  Orders Placed This Encounter  Procedures   Urinalysis, Routine w reflex microscopic   BLADDER SCAN AMB NON-IMAGING    It has been explained that the patient is to follow regularly with their PCP in addition to all other providers involved in their care and to follow instructions provided by these respective offices. Patient advised to contact urology clinic if any urologic-pertaining questions, concerns, new symptoms or problems arise in the interim period.  Patient Instructions  UTI prevention / management:  UTI symptoms may include:  - Pain / burning / discomfort when urinating - Recent increase in urinary urgency (how quickly you feel like you need to rush to the bathroom) - Recent increase in urinary frequency (how often you are urinating) - Fever - Acute mental status change / confusion - Fatigue / Feeling tired - Weakness - Note: Urine color, clarity, and odor are not considered to be clinically significant indicators of UTI and do not warrant urine testing unless patient is also experiencing UTI symptoms such as those listed above.  Difference between Urinalysis (urine dipstick test) and Urine culture / Why urine culture often needed to determine appropriate diagnosis and treatment of urologic symptoms: > Urinalysis (urine dipstick test): A quick office test used as an indicator to determine whether or not further testing is necessary (such as a urine culture, urine microscopy, etc.) The urinalysis cannot differentiate a true bacterial UTI or give a definitive diagnosis for the findings.  > Urine culture: May be performed based on the findings of a urinalysis to evaluate for UTI. Grows out on a petri dish for 48-72 hours. Provides important information about: whether or not bacterial growth is present and if so: what the predominant bacteria is which antibiotics will work best  against that bacteria That information is important so that we can diagnose and treat patients appropriately as there are other conditions which may mimic UTls which must not be missed (such as cancer, interstitial cystitis, stones, etc.). Assists us  with antibiotic stewardship to minimize patient's risk for developing antibiotic resistance (getting to a point where no antibiotics work anymore).  Options when UTI symptoms occur: 1. Call [facility] [place] and request to speak with triage nurse (phone # [phone], select option 3). In accordance with clinic guidelines the nurse will determine next steps based on patient-reported symptoms, which may include: same-day lab visit to provide urine specimen, recommendation to schedule Urology office visit appointment for further evaluation, recommendation to proceed to ER, etc. 2. Call your Primary Care Provider (PCP) office to request urgent / same-day visit. Be sure to request for urine culture to be ordered and have results faxed to Urology (fax # [phone]).  3. Go to urgent care. Be sure to request for urine culture to be ordered and have results faxed to Urology (fax # [phone]).   For bladder pain/ burning with urination: - Can take over-the-counter Pyridium  (phenazopyridine ; commonly known under the AZO brand) for a few days as needed. Limit use to no more than 3 days consecutively due to risk for methemoglobinemia, liver function issues, and bone health damage with long term use of Pyridium . - Alternative: Prescription urinary analgesics (such as Uribel, Urogesic blue, Urelle, Uro-MP). Often expensive / poorly covered by insurance unfortunately.  Options / recommendations for UTI prevention: - Low dose  antibiotic daily for UTI prophylaxis. - Topical vaginal estrogen for vaginal atrophy (aka Genitourinary Syndrome of Menopause (GSM)). - Adequate daily fluid intake to flush out the urinary tract. - Go to the bathroom to  urinate every 4-6 hours while awake to minimize urinary stasis / bacterial overgrowth in the bladder. - Proanthocyanidin (PAC) supplement 36 mg daily; must be soluble (insoluble form of PAC will be ineffective). Recommended brand: Ellura. This is an over-the-counter supplement (often must be found/ purchased online) supplement derived from cranberries with concentrated active component: Proanthocyanidin (PAC) 36 mg daily. Decreases bacterial adherence to bladder lining.  - D-mannose powder (2 grams daily). This is an over-the-counter supplement which decreases bacterial adherence to bladder lining (it is a sugar that inhibits bacterial adherence to urothelial cells by binding to the pili of enteric bacteria). Take as per manufacturer recommendation. Can be used as an alternative or in addition to the concentrated cranberry supplement.  - Vitamin C supplement to acidify urine to minimize bacterial growth.  - Probiotic to maintain healthy vaginal microbiome to suppress bacteria at urethral opening. Brand recommendations: Verlon (includes probiotic & D-mannose ), Feminine Balance (highest concentration of lactobacillus) or Hyperbiotic Pro 15.  Note for patients with diabetes:  - Be aware that D-mannose contains sugar.  Note for patients with interstitial cystitis (IC):  - Patients with IC should typically avoid cranberry/ PAC supplements and Vitamin C supplements due to their acidity, which may exacerbate IC-related bladder pain. - Symptoms of true bacterial UTI can overlap / mimic symptoms of an IC flare up. Antibiotic use is NOT indicated for IC flare ups. Urine culture needed prior to antibiotic treatment for IC patients. The goal is to minimize your risk for developing antibiotic-resistant bacteria.    Vaginal atrophy I Genitourinary syndrome of menopause (GSM):  What it is: Changes in the vaginal environment (including the vulva and urethra) including: Thinning of the epithelium (skin/ mucosa  surface) Can contribute to urinary urgency and frequency Can contribute to dryness, itching, irritation of the vulvar and vaginal tissue Can contribute to pain with intercourse Can contribute to physical changes of the labia, vulva, and vagina such as: Narrowing of the vaginal opening Decreased vaginal length Loss of labial architecture Labial adhesions Pale color of vulvovaginal tissue Loss of pubic hair Allows bacteria to become adherent Results in increased risk for urinary tract infection (UTI) due to bacterial overgrowth and migration up the urethra into the bladder Change in vaginal pH (acid/ base balance) Allows for alteration / disruption of the normal bacterial flora / microbiome Results in increased risk for urinary tract infection (UTI) due to bacterial overgrowth  Treatment options: Over-the-counter lubricants (see list below). Prescription vaginal estrogen replacement. Options: Topical vaginal estrogen (estradiol ) cream: (Estrace , Premarin, compounded) Apply as directed Estring  vaginal ring Exchanged every 3 months (either at home or in office by provider) Vagifem  vaginal tablet Inserted nightly for 2 weeks then twice a week (long term) lntrarosa vaginal suppository Vaginal DHEA: converts to estrogen in vaginal tissue without systemic effect Inserted nightly (long term) 3. Vaginal laser therapy (Mona Olam touch) Performed in 3 treatments each 6 weeks apart (available in our [place] office). Can feel like a sunburn for 3-4 days after each treatment until new skin heals in. Usually not covered by insurance. Estimated cost is $1500 for all 3 sessions.  FYI regarding prescription vaginal estrogen treatment options: - All topical vaginal estrogen replacement options are equivalent in terms of efficacy. - Topical vaginal estrogen replacement will take about 3 months  to be effective. - OK to have sex with any of the topical vaginal estrogen replacement options. - Topical  vaginal estrogen replacement may sting/burn initially due to severe dryness, which will improve with ongoing treatment. - Studies have demonstrated negligible systemic absorption of low-dose intravaginal estrogen cream therefore the risk for cancer development or recurrence with this medication is minimal.  Genitourinary Syndrome of Menopause: AUA/SUFU/AUGS Guideline (2025) [email or web address]  Topical vaginal estrogen cream safe to use with breast cancer history [email or web address]  Topical vaginal estrogen cream safe to use with blood clot history [email or web address]   Lubricants and Moisturizers for Treating Genitourinary Syndrome of Menopause and Vulvovaginal Atrophy Treatment Comments I Available Products   Lubricants   Water -based Ingredients: Deionized water , glycerin, propylene glycol; latex safe; rare irritation; dry out with extended sexual activity Astroglide, Good Clean Love, K-Y Jelly, Natural, Organic, Pink, Sliquid, Sylk, Yes    Oil Based Ingredients: avocado, olive, peanut, corn; latex safe; can be used with silicone products; staining; safe (unless peanut allergy); non-irritating Coconut oil, vegetable oil, vitamin E oil  Silicone-Based Ingredients: Silicone polymers; staining; typically nonirritating, long lasting; waterproof; should not be used with silicone dilators, sexual toys, or gynecologic products Astroglide X, Oceanus Ultra Pure, Pink Silicone, Pjur Eros, Replens Silky Smooth, Silicone Premium JO, SKYN, Uberlube, [employer] Based Minimize harm to sperm motility; designed for couples trying to conceive:  Astroglide TTC, Conceive Plus,  PreSeed, Yes Baby  Fertility Friendly Minimize harm to sperm motility; designed for couples trying to conceive:  Astroglide, TTC, Conceive Plus, PreSeed, Yes Baby  Vaginal Moisturizers   Vaginal Moisturizers For maintenance use 1 to 3 times weekly; can benefit women with dryness, chafing with AOL, and recurrent vaginal infections irrespective of sexual activity timing Balance Active Menopause Vaginal Moisturizing Lubricant, Canesintima Intimate Moisturizer, Replens, Rephresh, Sylk Natural Intimate Moisturizer, Yes Vaginal Moisturizer  Hybrids Properties of both water  and silicone-based products (combination of a vaginal lubricant and moisturizer); Non-irritating; good option for women with allergies and sensitivities Lubrigyn, Luvena  Suppositories Hyaluronic acid to retain moisture Revaree  Vulvar Soothing Creams/Oils    Medicated Creams Pain and burn relief; Ingredients: 4% Lidocaine , Aloe Vera gel Releveum ([place])  Non-Medicated Creams For anti-itch and moisture/maintenance; Ingredients: Coconut oil, Avocado oil, Shea Butter, Olive oil, Vitamin E Vajuvenate, Vmagic  Oils for moisture/maintenance: Coconut oil, Vitamin E oil, Emu oil     Electronically signed by:  Lauraine JAYSON Oz, FNP   01/15/24    1:35 PM

## 2024-01-15 NOTE — Telephone Encounter (Signed)
 Rx called [name] about rx for Vitamin C methenamine  (HIPREX ) 1 g tablet. Per Sarah patient can get vitamin C over the cover if needed.

## 2024-01-15 NOTE — Patient Instructions (Signed)
 UTI prevention / management:  UTI symptoms may include:  - Pain / burning / discomfort when urinating - Recent increase in urinary urgency (how quickly you feel like you need to rush to the bathroom) - Recent increase in urinary frequency (how often you are urinating) - Fever - Acute mental status change / confusion - Fatigue / Feeling tired - Weakness - Note: Urine color, clarity, and odor are not considered to be clinically significant indicators of UTI and do not warrant urine testing unless patient is also experiencing UTI symptoms such as those listed above.  Difference between Urinalysis (urine dipstick test) and Urine culture / Why urine culture often needed to determine appropriate diagnosis and treatment of urologic symptoms: > Urinalysis (urine dipstick test): A quick office test used as an indicator to determine whether or not further testing is necessary (such as a urine culture, urine microscopy, etc.) The urinalysis cannot differentiate a true bacterial UTI or give a definitive diagnosis for the findings.  > Urine culture: May be performed based on the findings of a urinalysis to evaluate for UTI. Grows out on a petri dish for 48-72 hours. Provides important information about: whether or not bacterial growth is present and if so: what the predominant bacteria is which antibiotics will work best against that bacteria That information is important so that we can diagnose and treat patients appropriately as there are other conditions which may mimic UTls which must not be missed (such as cancer, interstitial cystitis, stones, etc.). Assists us  with antibiotic stewardship to minimize patient's risk for developing antibiotic resistance (getting to a point where no antibiotics work anymore).  Options when UTI symptoms occur: 1. Call [facility] [place] and request to speak with triage nurse (phone # [phone], select option 3). In accordance with clinic guidelines  the nurse will determine next steps based on patient-reported symptoms, which may include: same-day lab visit to provide urine specimen, recommendation to schedule Urology office visit appointment for further evaluation, recommendation to proceed to ER, etc. 2. Call your Primary Care Provider (PCP) office to request urgent / same-day visit. Be sure to request for urine culture to be ordered and have results faxed to Urology (fax # [phone]).  3. Go to urgent care. Be sure to request for urine culture to be ordered and have results faxed to Urology (fax # [phone]).   For bladder pain/ burning with urination: - Can take over-the-counter Pyridium (phenazopyridine; commonly known under the AZO brand) for a few days as needed. Limit use to no more than 3 days consecutively due to risk for methemoglobinemia, liver function issues, and bone health damage with long term use of Pyridium. - Alternative: Prescription urinary analgesics (such as Uribel, Urogesic blue, Urelle, Uro-MP). Often expensive / poorly covered by insurance unfortunately.  Options / recommendations for UTI prevention: - Low dose antibiotic daily for UTI prophylaxis. - Topical vaginal estrogen for vaginal atrophy (aka Genitourinary Syndrome of Menopause (GSM)). - Adequate daily fluid intake to flush out the urinary tract. - Go to the bathroom to urinate every 4-6 hours while awake to minimize urinary stasis / bacterial overgrowth in the bladder. - Proanthocyanidin (PAC) supplement 36 mg daily; must be soluble (insoluble form of PAC will be ineffective). Recommended brand: Ellura. This is an over-the-counter supplement (often must be found/ purchased online) supplement derived from cranberries with concentrated active component: Proanthocyanidin (PAC) 36 mg daily. Decreases bacterial adherence to bladder lining.  - D-mannose powder (2 grams daily). This is an over-the-counter supplement  which decreases bacterial adherence to bladder  lining (it is a sugar that inhibits bacterial adherence to urothelial cells by binding to the pili of enteric bacteria). Take as per manufacturer recommendation. Can be used as an alternative or in addition to the concentrated cranberry supplement.  - Vitamin C supplement to acidify urine to minimize bacterial growth.  - Probiotic to maintain healthy vaginal microbiome to suppress bacteria at urethral opening. Brand recommendations: Estill Hemming (includes probiotic & D-mannose ), Feminine Balance (highest concentration of lactobacillus) or Hyperbiotic Pro 15.  Note for patients with diabetes:  - Be aware that D-mannose contains sugar.  Note for patients with interstitial cystitis (IC):  - Patients with IC should typically avoid cranberry/ PAC supplements and Vitamin C supplements due to their acidity, which may exacerbate IC-related bladder pain. - Symptoms of true bacterial UTI can overlap / mimic symptoms of an IC flare up. Antibiotic use is NOT indicated for IC flare ups. Urine culture needed prior to antibiotic treatment for IC patients. The goal is to minimize your risk for developing antibiotic-resistant bacteria.    Vaginal atrophy I Genitourinary syndrome of menopause (GSM):  What it is: Changes in the vaginal environment (including the vulva and urethra) including: Thinning of the epithelium (skin/ mucosa surface) Can contribute to urinary urgency and frequency Can contribute to dryness, itching, irritation of the vulvar and vaginal tissue Can contribute to pain with intercourse Can contribute to physical changes of the labia, vulva, and vagina such as: Narrowing of the vaginal opening Decreased vaginal length Loss of labial architecture Labial adhesions Pale color of vulvovaginal tissue Loss of pubic hair Allows bacteria to become adherent Results in increased risk for urinary tract infection (UTI) due to bacterial overgrowth and migration up the urethra into the bladder Change in  vaginal pH (acid/ base balance) Allows for alteration / disruption of the normal bacterial flora / microbiome Results in increased risk for urinary tract infection (UTI) due to bacterial overgrowth  Treatment options: Over-the-counter lubricants (see list below). Prescription vaginal estrogen replacement. Options: Topical vaginal estrogen (estradiol ) cream: (Estrace , Premarin, compounded) Apply as directed Estring  vaginal ring Exchanged every 3 months (either at home or in office by provider) Vagifem  vaginal tablet Inserted nightly for 2 weeks then twice a week (long term) lntrarosa vaginal suppository Vaginal DHEA: converts to estrogen in vaginal tissue without systemic effect Inserted nightly (long term) 3. Vaginal laser therapy (Mona Edwina Gram touch) Performed in 3 treatments each 6 weeks apart (available in our [place] office). Can feel like a sunburn for 3-4 days after each treatment until new skin heals in. Usually not covered by insurance. Estimated cost is $1500 for all 3 sessions.  FYI regarding prescription vaginal estrogen treatment options: - All topical vaginal estrogen replacement options are equivalent in terms of efficacy. - Topical vaginal estrogen replacement will take about 3 months to be effective. - OK to have sex with any of the topical vaginal estrogen replacement options. - Topical vaginal estrogen replacement may sting/burn initially due to severe dryness, which will improve with ongoing treatment. - Studies have demonstrated negligible systemic absorption of low-dose intravaginal estrogen cream therefore the risk for cancer development or recurrence with this medication is minimal.  Genitourinary Syndrome of Menopause: AUA/SUFU/AUGS Guideline (2025) [email or web address]  Topical vaginal estrogen cream safe to use with breast cancer  history [email or web address]  Topical vaginal estrogen cream safe to use with blood clot history [email or web address]   Lubricants and Moisturizers for Treating Genitourinary Syndrome of Menopause and Vulvovaginal Atrophy Treatment Comments I  Available Products   Lubricants   Water-based Ingredients: Deionized water, glycerin, propylene glycol; latex safe; rare irritation; dry out with extended sexual activity Astroglide, Good Clean Love, K-Y Jelly, Natural, Organic, Pink, Sliquid, Sylk, Yes    Oil Based Ingredients: avocado, olive, peanut, corn; latex safe; can be used with silicone products; staining; safe (unless peanut allergy); non-irritating Coconut oil, vegetable oil, vitamin E oil  Silicone-Based Ingredients: Silicone polymers; staining; typically nonirritating, long lasting; waterproof; should not be used with silicone dilators, sexual toys, or gynecologic products Astroglide X, Oceanus Ultra Pure, Pink Silicone, Pjur Eros, Replens Silky Smooth, Silicone Premium JO, SKYN, Uberlube, [employer] Based Minimize harm to sperm motility; designed for couples trying to conceive:  Astroglide TTC, Conceive Plus, PreSeed, Yes Baby  Fertility Friendly Minimize harm to sperm motility; designed for couples trying to conceive:  Astroglide, TTC, Conceive Plus, PreSeed, Yes Baby  Vaginal Moisturizers   Vaginal Moisturizers For maintenance use 1 to 3 times weekly; can benefit women with dryness, chafing with AOL, and recurrent vaginal infections irrespective of sexual activity timing Balance Active Menopause Vaginal Moisturizing Lubricant, Canesintima Intimate Moisturizer, Replens, Rephresh, Sylk Natural Intimate Moisturizer, Yes Vaginal  Moisturizer  Hybrids Properties of both water and silicone-based products (combination of a vaginal lubricant and moisturizer); Non-irritating; good option for women with allergies and sensitivities Lubrigyn, Luvena  Suppositories Hyaluronic acid to retain moisture Revaree  Vulvar Soothing Creams/Oils    Medicated Creams Pain and burn relief; Ingredients: 4% Lidocaine, Aloe Vera gel Releveum ([place])  Non-Medicated Creams For anti-itch and moisture/maintenance; Ingredients: Coconut oil, Avocado oil, Shea Butter, Olive oil, Vitamin E Vajuvenate, Vmagic  Oils for moisture/maintenance: Coconut oil, Vitamin E oil, Emu oil

## 2024-01-19 DIAGNOSIS — R278 Other lack of coordination: Secondary | ICD-10-CM | POA: Diagnosis not present

## 2024-01-19 DIAGNOSIS — M6281 Muscle weakness (generalized): Secondary | ICD-10-CM | POA: Diagnosis not present

## 2024-01-20 DIAGNOSIS — E871 Hypo-osmolality and hyponatremia: Secondary | ICD-10-CM | POA: Diagnosis not present

## 2024-01-20 DIAGNOSIS — R350 Frequency of micturition: Secondary | ICD-10-CM | POA: Diagnosis not present

## 2024-01-20 DIAGNOSIS — N390 Urinary tract infection, site not specified: Secondary | ICD-10-CM | POA: Diagnosis not present

## 2024-01-21 ENCOUNTER — Ambulatory Visit: Payer: Self-pay | Admitting: Family Medicine

## 2024-01-21 ENCOUNTER — Other Ambulatory Visit: Payer: Self-pay | Admitting: Family Medicine

## 2024-01-21 DIAGNOSIS — R278 Other lack of coordination: Secondary | ICD-10-CM | POA: Diagnosis not present

## 2024-01-21 DIAGNOSIS — M6281 Muscle weakness (generalized): Secondary | ICD-10-CM | POA: Diagnosis not present

## 2024-01-21 LAB — BASIC METABOLIC PANEL WITH GFR
BUN/Creatinine Ratio: 17 (ref 12–28)
BUN: 14 mg/dL (ref 10–36)
CO2: 24 mmol/L (ref 20–29)
Calcium: 10 mg/dL (ref 8.7–10.3)
Chloride: 93 mmol/L — ABNORMAL LOW (ref 96–106)
Creatinine, Ser: 0.83 mg/dL (ref 0.57–1.00)
Glucose: 91 mg/dL (ref 70–99)
Potassium: 4.8 mmol/L (ref 3.5–5.2)
Sodium: 135 mmol/L (ref 134–144)
eGFR: 67 mL/min/1.73 (ref >59)

## 2024-01-21 LAB — OSMOLALITY, URINE: Osmolality, Ur: 242 mosm/kg

## 2024-01-21 LAB — OSMOLALITY: Osmolality Meas: 275 mosm/kg — ABNORMAL LOW (ref 280–301)

## 2024-01-21 LAB — SODIUM, URINE, RANDOM: Sodium, Ur: 62 mmol/L

## 2024-01-22 DIAGNOSIS — M6281 Muscle weakness (generalized): Secondary | ICD-10-CM | POA: Diagnosis not present

## 2024-01-23 DIAGNOSIS — M6281 Muscle weakness (generalized): Secondary | ICD-10-CM | POA: Diagnosis not present

## 2024-01-26 ENCOUNTER — Other Ambulatory Visit: Payer: Self-pay | Admitting: Family Medicine

## 2024-01-26 DIAGNOSIS — M6281 Muscle weakness (generalized): Secondary | ICD-10-CM | POA: Diagnosis not present

## 2024-01-26 DIAGNOSIS — R278 Other lack of coordination: Secondary | ICD-10-CM | POA: Diagnosis not present

## 2024-01-28 DIAGNOSIS — M6281 Muscle weakness (generalized): Secondary | ICD-10-CM | POA: Diagnosis not present

## 2024-01-28 DIAGNOSIS — R278 Other lack of coordination: Secondary | ICD-10-CM | POA: Diagnosis not present

## 2024-01-29 DIAGNOSIS — F419 Anxiety disorder, unspecified: Secondary | ICD-10-CM | POA: Diagnosis not present

## 2024-01-29 DIAGNOSIS — M6281 Muscle weakness (generalized): Secondary | ICD-10-CM | POA: Diagnosis not present

## 2024-01-29 DIAGNOSIS — F331 Major depressive disorder, recurrent, moderate: Secondary | ICD-10-CM | POA: Diagnosis not present

## 2024-01-29 DIAGNOSIS — F5101 Primary insomnia: Secondary | ICD-10-CM | POA: Diagnosis not present

## 2024-01-30 DIAGNOSIS — M6281 Muscle weakness (generalized): Secondary | ICD-10-CM | POA: Diagnosis not present

## 2024-02-02 DIAGNOSIS — M6281 Muscle weakness (generalized): Secondary | ICD-10-CM | POA: Diagnosis not present

## 2024-02-02 DIAGNOSIS — R278 Other lack of coordination: Secondary | ICD-10-CM | POA: Diagnosis not present

## 2024-02-03 ENCOUNTER — Encounter (INDEPENDENT_AMBULATORY_CARE_PROVIDER_SITE_OTHER): Payer: Self-pay | Admitting: *Deleted

## 2024-02-03 DIAGNOSIS — M6281 Muscle weakness (generalized): Secondary | ICD-10-CM | POA: Diagnosis not present

## 2024-02-04 DIAGNOSIS — M6281 Muscle weakness (generalized): Secondary | ICD-10-CM | POA: Diagnosis not present

## 2024-02-04 DIAGNOSIS — R278 Other lack of coordination: Secondary | ICD-10-CM | POA: Diagnosis not present

## 2024-02-05 DIAGNOSIS — M6281 Muscle weakness (generalized): Secondary | ICD-10-CM | POA: Diagnosis not present

## 2024-02-09 DIAGNOSIS — R278 Other lack of coordination: Secondary | ICD-10-CM | POA: Diagnosis not present

## 2024-02-09 DIAGNOSIS — M6281 Muscle weakness (generalized): Secondary | ICD-10-CM | POA: Diagnosis not present

## 2024-02-10 ENCOUNTER — Other Ambulatory Visit: Payer: Self-pay

## 2024-02-10 ENCOUNTER — Encounter (HOSPITAL_COMMUNITY): Payer: Self-pay | Admitting: Emergency Medicine

## 2024-02-10 ENCOUNTER — Emergency Department (HOSPITAL_COMMUNITY)
Admission: EM | Admit: 2024-02-10 | Discharge: 2024-02-10 | Disposition: A | Discharge status: Home / Self Care | Source: Skilled Nursing Facility | Attending: Emergency Medicine | Admitting: Emergency Medicine

## 2024-02-10 DIAGNOSIS — Z7951 Long term (current) use of inhaled steroids: Secondary | ICD-10-CM | POA: Diagnosis not present

## 2024-02-10 DIAGNOSIS — Z79899 Other long term (current) drug therapy: Secondary | ICD-10-CM | POA: Insufficient documentation

## 2024-02-10 DIAGNOSIS — R600 Localized edema: Secondary | ICD-10-CM | POA: Diagnosis not present

## 2024-02-10 DIAGNOSIS — E119 Type 2 diabetes mellitus without complications: Secondary | ICD-10-CM | POA: Insufficient documentation

## 2024-02-10 DIAGNOSIS — Z7984 Long term (current) use of oral hypoglycemic drugs: Secondary | ICD-10-CM | POA: Insufficient documentation

## 2024-02-10 DIAGNOSIS — Z7982 Long term (current) use of aspirin: Secondary | ICD-10-CM | POA: Diagnosis not present

## 2024-02-10 DIAGNOSIS — I10 Essential (primary) hypertension: Secondary | ICD-10-CM | POA: Diagnosis not present

## 2024-02-10 DIAGNOSIS — R5383 Other fatigue: Secondary | ICD-10-CM | POA: Diagnosis not present

## 2024-02-10 DIAGNOSIS — E871 Hypo-osmolality and hyponatremia: Secondary | ICD-10-CM | POA: Insufficient documentation

## 2024-02-10 DIAGNOSIS — M6281 Muscle weakness (generalized): Secondary | ICD-10-CM | POA: Diagnosis not present

## 2024-02-10 DIAGNOSIS — D72829 Elevated white blood cell count, unspecified: Secondary | ICD-10-CM | POA: Diagnosis not present

## 2024-02-10 DIAGNOSIS — J45909 Unspecified asthma, uncomplicated: Secondary | ICD-10-CM | POA: Insufficient documentation

## 2024-02-10 DIAGNOSIS — R531 Weakness: Secondary | ICD-10-CM | POA: Diagnosis not present

## 2024-02-10 DIAGNOSIS — N3000 Acute cystitis without hematuria: Secondary | ICD-10-CM | POA: Diagnosis not present

## 2024-02-10 LAB — COMPREHENSIVE METABOLIC PANEL WITH GFR
ALT: 13 U/L (ref 0–44)
AST: 19 U/L (ref 15–41)
Albumin: 3.9 g/dL (ref 3.5–5.0)
Alkaline Phosphatase: 98 U/L (ref 38–126)
Anion gap: 13 (ref 5–15)
BUN: 13 mg/dL (ref 8–23)
CO2: 24 mmol/L (ref 22–32)
Calcium: 9.4 mg/dL (ref 8.9–10.3)
Chloride: 96 mmol/L — ABNORMAL LOW (ref 98–111)
Creatinine, Ser: 0.78 mg/dL (ref 0.44–1.00)
GFR, Estimated: >60 mL/min (ref >60)
Glucose, Bld: 144 mg/dL — ABNORMAL HIGH (ref 70–99)
Potassium: 4.1 mmol/L (ref 3.5–5.1)
Sodium: 133 mmol/L — ABNORMAL LOW (ref 135–145)
Total Bilirubin: 0.9 mg/dL (ref 0.0–1.2)
Total Protein: 7 g/dL (ref 6.5–8.1)

## 2024-02-10 LAB — URINALYSIS, ROUTINE W REFLEX MICROSCOPIC
Bilirubin Urine: NEGATIVE
Glucose, UA: NEGATIVE mg/dL
Hgb urine dipstick: NEGATIVE
Ketones, ur: NEGATIVE mg/dL
Nitrite: NEGATIVE
Protein, ur: 30 mg/dL — AB
Specific Gravity, Urine: 1.004 — ABNORMAL LOW (ref 1.005–1.030)
WBC, UA: >50 WBC/hpf (ref 0–5)
pH: 7 (ref 5.0–8.0)

## 2024-02-10 LAB — CBC
HCT: 36.8 % (ref 36.0–46.0)
Hemoglobin: 12.1 g/dL (ref 12.0–15.0)
MCH: 28.7 pg (ref 26.0–34.0)
MCHC: 32.9 g/dL (ref 30.0–36.0)
MCV: 87.4 fL (ref 80.0–100.0)
Platelets: 295 K/uL (ref 150–400)
RBC: 4.21 MIL/uL (ref 3.87–5.11)
RDW: 12.8 % (ref 11.5–15.5)
WBC: 7.1 K/uL (ref 4.0–10.5)
nRBC: 0 % (ref 0.0–0.2)

## 2024-02-10 LAB — TROPONIN I (HIGH SENSITIVITY): Troponin I (High Sensitivity): 7 ng/L (ref <18)

## 2024-02-10 LAB — TSH: TSH: 1.792 u[IU]/mL (ref 0.350–4.500)

## 2024-02-10 MED ORDER — HYDRALAZINE HCL 20 MG/ML IJ SOLN
20.0000 mg | INTRAMUSCULAR | Status: AC
  Administered 2024-02-10 (×2): 20 mg via INTRAVENOUS
  Filled 2024-02-10: qty 1

## 2024-02-10 MED ORDER — LEVOFLOXACIN 750 MG PO TABS: 750.0000 mg | ORAL_TABLET | Freq: Every day | ORAL | 0 refills | Status: AC

## 2024-02-10 MED ORDER — LEVOFLOXACIN 750 MG PO TABS
750.0000 mg | ORAL_TABLET | Freq: Once | ORAL | Status: AC
  Administered 2024-02-10 (×2): 750 mg via ORAL
  Filled 2024-02-10: qty 1

## 2024-02-10 MED ORDER — LEVOFLOXACIN 750 MG PO TABS: 750.0000 mg | ORAL_TABLET | Freq: Every day | ORAL | 0 refills | Status: DC

## 2024-02-10 NOTE — ED Triage Notes (Signed)
 Pt c/o weakness/fatigue for a few days. Facility sent her here for hypertension.

## 2024-02-10 NOTE — ED Notes (Signed)
 Attempted to call [facility], no answer.

## 2024-02-10 NOTE — ED Notes (Signed)
 AVS provided by edp was reviewed with the pt. Pt verbalized understanding with no additional questions at this time.

## 2024-02-10 NOTE — Discharge Instructions (Addendum)
 Your testing today shows that you may have a bladder infection.  I have ordered a culture of the urine, this will not come back for a couple of days but in the meantime you can start taking Levaquin  once a day for the next 6 days, the first dose will be tomorrow night.  We gave you the first dose before you left the ER.  Additionally your salt level was only slightly low and this is nothing to be concerned about in fact it looks better than usual.  Your blood pressure has been high but we gave you some education had it helped, take your nightly medicine with soon as you get to your nursing facility.  Thank you for allowing us  to treat you in the emergency department today.  After reviewing your examination and potential testing that was done it appears that you are safe to go home.  I would like for you to follow-up with your doctor within the next several days, have them obtain your records and follow-up with them to review all potential tests and results from your visit.  If you should develop severe or worsening symptoms return to the emergency department immediately

## 2024-02-10 NOTE — ED Notes (Signed)
 Pt departing with transport arranged by previously clinician. DNR form from bedside and copy of AVS summary returned from bedside

## 2024-02-10 NOTE — ED Notes (Signed)
 Patient's son notified and updated.

## 2024-02-10 NOTE — ED Provider Notes (Signed)
 [place] EMERGENCY DEPARTMENT AT [facility] Provider Note   CSN: [id number] Arrival date & time: 02/10/24  1633     Patient presents with: Fatigue   [name] is a 89 y.o. female.   HPI   This patient is a 89 year old female with a history of diabetes hypertension anxiety on Xanax  as well as some reactive airway disease.  She takes a daily preventative antibiotic for UTIs, she is currently at [facility] facility and presents after having some weakness for a couple of days, she was sent because of having high blood pressure, currently 176/55.  Review of the medical record shows that the patient had been seen in the ED about a month ago for acute cystitis as well as some hyponatremia, she had been admitted to the hospital the week prior for hyponatremia and urinary retention, she is usually seen in the emergency department approximately once or twice a month over the last few months.  She denies fevers vomiting or diarrhea, she does endorse mild swelling of her ankles, no changes in vision, no headaches, no coughing or shortness of breath at this time.  Prior to Admission medications   Medication Sig Start Date End Date Taking? Authorizing Provider  levofloxacin  (LEVAQUIN ) 750 MG tablet Take 1 tablet (750 mg total) by mouth daily for 6 days. 02/10/24 02/16/24 Yes Cleotilde Rogue, MD  acetaminophen  (TYLENOL ) 325 MG tablet TAKE (2) TABLETS BY MOUTH EVERY SIX HOURS AS NEEDED FOR PAIN. MAX 3GM IN 24 HOURS. Patient taking differently: Take 650 mg by mouth every 6 (six) hours as needed for mild pain (pain score 1-3). 07/28/23   [name], MD  albuterol  (VENTOLIN  HFA) 108 (90 Base) MCG/ACT inhaler INHALE 2 PUFFS INTO THE LUNGS EVERY SIX HOURS AS NEEDED FOR WHEEZING OR SHORTNESS OF BREATH. 09/26/23   [name], MD  ALPRAZolam  (XANAX ) 0.25 MG tablet Take 1 tablet (0.25 mg total) by mouth at bedtime. 12/29/23   Maree, Pratik D, DO  amLODipine  (NORVASC ) 10 MG tablet TAKE (1) TABLET  BY MOUTH ONCE DAILY. Patient taking differently: Take 10 mg by mouth daily. 10/07/23   [name], MD  aspirin  (ASPIRIN  LOW DOSE) 81 MG chewable tablet CHEW (1) TABLET BY MOUTH ONCE DAILY. Patient taking differently: Chew 81 mg by mouth daily. 09/06/23   [name], MD  beta carotene (BETA CAROTENE PROVITAMIN A) 25000 UNIT capsule TAKE (1) CAPSULE BY MOUTH AT BEDTIME. Patient taking differently: Take 25,000 Units by mouth at bedtime. 10/07/23   [name], MD  BISACODYL  5 MG EC tablet TAKE 1 TABLET BY MOUTH ONCE DAILY AS NEEDED FOR MODERATE CONSTIPATION. Patient taking differently: Take 5 mg by mouth daily as needed for moderate constipation. 12/20/22   [name], MD  cyanocobalamin  1000 MCG tablet Take 1,000 mcg by mouth at bedtime.    [provider]  cycloSPORINE  (RESTASIS ) 0.05 % ophthalmic emulsion Place 1 drop into both eyes 2 (two) times daily.    [provider]  dicyclomine  (BENTYL ) 10 MG capsule TAKE 1 CAPSULE BY MOUTH TWICE DAILY AS NEEDED FOR ABDOMINAL CRAMPS AND LOOSE STOOL. Patient taking differently: Take 10 mg by mouth 2 (two) times daily as needed (abdominal cramps and loose stool). TAKE 1 CAPSULE BY MOUTH TWICE DAILY AS NEEDED FOR ABDOMINAL CRAMPS AND LOOSE STOOL. 11/21/22   [name], MD  estradiol  (ESTRACE ) 0.1 MG/GM vaginal cream Discard plastic applicator. Insert a blueberry size amount (approximately 1 gram) of cream  on fingertip inside vagina at bedtime every night for 1 week then every other night. For long term use. 01/15/24   Gerldine Lauraine BROCKS, FNP  ferrous sulfate  (FEROSUL) 325 (65 FE) MG tablet TAKE (1) TABLET BY MOUTH EVERY OTHER DAY. Patient not taking: Reported on 01/13/2024 10/07/23   [name], MD  glucose blood (EASYMAX TEST) test strip CHECK BLOOD SUGAR ONCE DAILY.(CALL MD IF BS BELOW 60: OR IF BS ABOVE 400) 10/07/23   Luking, Glendia LABOR, MD  hydrALAZINE  (APRESOLINE ) 25 MG tablet Take 1 tablet (25 mg total) by mouth 3 (three) times  daily. 12/31/23   Luking, Glendia LABOR, MD  IVIZIA DRY EYES 0.5 % SOLN Place 1 drop into both eyes 2 (two) times daily. 09/22/23   [provider]  loratadine  (ALLERGY RELIEF) 10 MG tablet TAKE (1) TABLET BY MOUTH ONCE DAILY. Patient taking differently: Take 10 mg by mouth daily. 10/07/23   [name], MD  Melatonin 10 MG TABS Take 10 mg by mouth at bedtime. 09/25/23   Cook, Jayce G, DO  metFORMIN  (GLUCOPHAGE ) 500 MG tablet TAKE (1) TABLET BY MOUTH IN THE MORNING & (1/2) TABLET (250MG ) BY MOUTH AT SUPPER Patient taking differently: Take 500 mg by mouth 2 (two) times daily with a meal. TAKE (1) TABLET BY MOUTH IN THE MORNING & (1/2) TABLET (250MG ) BY MOUTH AT SUPPER 10/07/23   Luking, Glendia LABOR, MD  methenamine  (HIPREX ) 1 g tablet Take 1 tablet (1 g total) by mouth 2 (two) times daily with a meal. Most effective when taken with a daily Vitamin C supplement. 01/15/24   Gerldine Lauraine BROCKS, FNP  Multiple Vitamins-Minerals (PRESERVISION AREDS 2) CAPS Take 1 capsule by mouth in the morning and at bedtime.    [provider]  ondansetron  (ZOFRAN -ODT) 4 MG disintegrating tablet 4mg  ODT q4 hours prn nausea/vomit Patient not taking: Reported on 01/13/2024 09/05/23   Zammit, Joseph, MD  pantoprazole  (PROTONIX ) 40 MG tablet Take 1 tablet (40 mg total) by mouth 2 (two) times daily before a meal. 12/24/23   Cook, Jayce G, DO  phenazopyridine  (PYRIDIUM ) 100 MG tablet Take 1 tablet (100 mg total) by mouth 3 (three) times daily. 01/02/24   Suellen Cantor A, PA-C  polyethylene glycol powder (GOODSENSE CLEARLAX) 17 GM/SCOOP powder 1 scoop in 8 ounces water  once daily as needed for constipation Patient taking differently: Take 17 g by mouth daily as needed for mild constipation. 1 scoop in 8 ounces water  once daily as needed for constipation 06/03/23   [name], MD  Safety Lancets 28G MISC CHECK BLOOD SUGAR ONCE DAILY. 01/21/24   [name], MD  sertraline  (ZOLOFT ) 25 MG tablet TAKE (1) TABLET BY MOUTH ONCE  DAILY. Patient taking differently: Take 25 mg by mouth daily. 10/07/23   [name], MD  sodium chloride  1 g tablet TAKE (1) TABLET BY MOUTH TWICE DAILY WITH A MEAL. 01/26/24   [name], MD  sucralfate  (CARAFATE ) 1 g tablet Crush and mix with 30 ml of water  three times a day for 2 weeks 01/13/24   [name], MD  tamsulosin  (FLOMAX ) 0.4 MG CAPS capsule Take 1 capsule (0.4 mg total) by mouth daily. 12/29/23   Maree, Pratik D, DO  valsartan  (DIOVAN ) 320 MG tablet Take 1 tablet (320 mg total) by mouth daily. 12/22/23   [name], MD  Vitamin D , Cholecalciferol , 10 MCG (400 UNIT) TABS TAKE (1) TABLET BY MOUTH ONCE DAILY. Patient taking differently: Take  1 tablet by mouth daily. 10/07/23   [name], MD    Allergies: Statins, Phenergan [promethazine hcl], Hydrocortisone, Lisinopril, Prednisone , and Trazodone  and nefazodone    Review of Systems  All other systems reviewed and are negative.   Updated Vital Signs BP (!) 181/89   Pulse 87   Temp 97.9 F (36.6 C) (Oral)   Resp 20   Ht 1.727 m (5' 8)   Wt 78.9 kg   SpO2 96%   BMI 26.45 kg/m   Physical Exam Vitals and nursing note reviewed.  Constitutional:      General: She is not in acute distress.    Appearance: She is well-developed.  HENT:     Head: Normocephalic and atraumatic.     Mouth/Throat:     Pharynx: No oropharyngeal exudate.  Eyes:     General: No scleral icterus.       Right eye: No discharge.        Left eye: No discharge.     Conjunctiva/sclera: Conjunctivae normal.     Pupils: Pupils are equal, round, and reactive to light.  Neck:     Thyroid : No thyromegaly.     Vascular: No JVD.  Cardiovascular:     Rate and Rhythm: Normal rate and regular rhythm.     Heart sounds: Normal heart sounds. No murmur heard.    No friction rub. No gallop.  Pulmonary:     Effort: Pulmonary effort is normal. No respiratory distress.     Breath sounds: Normal breath sounds. No wheezing or rales.  Abdominal:      General: Bowel sounds are normal. There is no distension.     Palpations: Abdomen is soft. There is no mass.     Tenderness: There is no abdominal tenderness.  Musculoskeletal:        General: No tenderness. Normal range of motion.     Cervical back: Normal range of motion and neck supple.     Right lower leg: Edema present.     Left lower leg: Edema present.     Comments: Scant bilateral  LE edema at the ankles  Lymphadenopathy:     Cervical: No cervical adenopathy.  Skin:    General: Skin is warm and dry.     Findings: No erythema or rash.  Neurological:     Mental Status: She is alert.     Coordination: Coordination normal.     Comments: The patient is able to answer my questions, she is able to follow commands, she has no focal weakness no facial droop, speech is normal, coordination seems normal, she does have a general feeling of fatigue but does not appear to objectively weak  Psychiatric:        Behavior: Behavior normal.     (all labs ordered are listed, but only abnormal results are displayed) Labs Reviewed  COMPREHENSIVE METABOLIC PANEL WITH GFR - Abnormal; Notable for the following components:      Result Value   Sodium 133 (*)    Chloride 96 (*)    Glucose, Bld 144 (*)    All other components within normal limits  URINALYSIS, ROUTINE W REFLEX MICROSCOPIC - Abnormal; Notable for the following components:   APPearance CLOUDY (*)    Specific Gravity, Urine 1.004 (*)    Protein, ur 30 (*)    Leukocytes,Ua LARGE (*)    Bacteria, UA FEW (*)    All other components within normal limits  URINE CULTURE  CBC  TSH  TROPONIN I (HIGH SENSITIVITY)    EKG: EKG Interpretation Date/Time:  Tuesday February 10 2024 16:43:53 EDT Ventricular Rate:  87 PR Interval:  213 QRS Duration:  78 QT Interval:  377 QTC Calculation: 454 R Axis:   60  Text Interpretation: Sinus rhythm Atrial premature complexes Borderline prolonged PR interval Anterior infarct, old since last tracing  no significant change Confirmed by Cleotilde Rogue ([id number]) on 02/10/2024 4:49:41 PM  Radiology: No results found.   Procedures   Medications Ordered in the ED  hydrALAZINE  (APRESOLINE ) injection 20 mg (has no administration in time range)  levofloxacin  (LEVAQUIN ) tablet 750 mg (has no administration in time range)                                    Medical Decision Making Amount and/or Complexity of Data Reviewed Labs: ordered.  Risk Prescription drug management.    This patient presents to the ED for concern of generalized weakness and hypertension differential diagnosis includes hypertensive related disease, could be infectious with frequent UTIs, would also consider that this could be related to her chronic hyponatremia, this might also just be related to being 89 years old and having intermittent weakness.  Will check labs and urinalysis, EKG is unremarkable    Additional history obtained   Additional history obtained from Electronic Medical Record External records from outside source obtained and reviewed including medical record including prior admissions   Lab Tests:  I Ordered, and personally interpreted labs.  The pertinent results include: Minimal hyponatremia, urinalysis with lots of white blood cells, culture sent, CBC without leukocytosis   Medicines ordered and prescription drug management:  I ordered medication including hydralazine , Levaquin     I have reviewed the patients home medicines and have made adjustments as needed   Problem List / ED Course:  UTI, hypertension, patient in no distress with unremarkable vital signs except for slight hypertension, improving at the time of discharge   Social Determinants of Health:  History of frequent UTIs, last one grew Klebsiella over a month ago, will treat for that as a multidrug-resistant organism with a fluoroquinolone, culture pending, patient stable for discharge        Final diagnoses:  Acute  cystitis without hematuria  Essential hypertension  Hyponatremia    ED Discharge Orders          Ordered    levofloxacin  (LEVAQUIN ) 750 MG tablet  Daily        02/10/24 1837               Cleotilde Rogue, MD 02/10/24 1839

## 2024-02-11 NOTE — Telephone Encounter (Unsigned)
 Copied from CRM [id number]. Topic: Clinical - Medical Advice >> Feb 11, 2024  8:58 AM Everette C wrote: Reason for CRM: Kyra with [facility] has called to share that the patient was seen at [facility] last night 02/10/24 and notified that they were experiencing a potential bladder infection  The patient was prescribed levofloxacin  (LEVAQUIN ) 750 MG tablet [[id number]] for the next 6 days   Please contact Kyra at [phone] further if needed to discuss the patient's discharge from the hospital and continued care

## 2024-02-12 DIAGNOSIS — M6281 Muscle weakness (generalized): Secondary | ICD-10-CM | POA: Diagnosis not present

## 2024-02-13 LAB — URINE CULTURE: Culture: >=100000 — AB

## 2024-02-14 ENCOUNTER — Telehealth (HOSPITAL_BASED_OUTPATIENT_CLINIC_OR_DEPARTMENT_OTHER): Payer: Self-pay | Admitting: *Deleted

## 2024-02-14 NOTE — Telephone Encounter (Signed)
 Post ED Visit - Positive Culture Follow-up  Culture report reviewed by antimicrobial stewardship pharmacist: [facility] []  Rankin Dee, Pharm.D. []  Venetia Gully, Pharm.D., BCPS AQ-ID []  Garrel Crews, Pharm.D., BCPS []  Almarie Lunger, Pharm.D., BCPS []  [place], [place].D., BCPS, AAHIVP []  Rosaline Bihari, Pharm.D., BCPS, AAHIVP []  Vernell Meier, PharmD, BCPS []  Latanya Hint, PharmD, BCPS []  Donald Medley, PharmD, BCPS []  Rocky Bold, PharmD []  Dorothyann Alert, PharmD, BCPS [x]  Dorn Buttner, PharmD  [facility] []  Rosaline Edison, PharmD []  Romona Bliss, PharmD []  Dolphus Roller, PharmD []  Veva Seip, Rph []  Vernell Daunt) Leonce, PharmD []  Eva Allis, PharmD []  Rosaline Millet, PharmD []  Iantha Batch, PharmD []  Arvin Gauss, PharmD []  Wanda Hasting, PharmD []  Ronal Rav, PharmD []  Rocky Slade, PharmD []  Bard Jeans, PharmD   Positive urine culture Treated with Levofloxacin , organism sensitive to the same and no further patient follow-up is required at this time.  Albino Alan Novak 02/14/2024, 2:09 PM

## 2024-02-16 ENCOUNTER — Ambulatory Visit: Payer: Self-pay

## 2024-02-16 NOTE — Telephone Encounter (Signed)
 FYI Only or Action Required?: Action required by provider: update on patient condition.  Patient was last seen in primary care on 01/13/2024 by [name], MD.  Called Nurse Triage reporting Fatigue.  Symptoms began several days ago.  Interventions attempted: Prescription medications: Levofloxacin  (1 more dose tonight) and Rest, hydration, or home remedies.  Symptoms are: unchanged.  Triage Disposition: See Physician Within 24 Hours  Patient/caregiver understands and will follow disposition?: No, wishes to speak with PCP  Copied from CRM #[id number]. Topic: Clinical - Medical Advice >> Feb 16, 2024  2:07 PM Debby BROCKS wrote: Reason for CRM: Auston on [employer] is calling on behalf of the patient. She states the patient had a UTI and was put on antibiotics. Today is the last of the 6 days for the antibiotics but the patient states that the antibiotics didn't do much. They would like to know if there is an alternative prescription she can take.  Callback [phone] Reason for Disposition  [1] MODERATE weakness (e.g., interferes with work, school, normal activities) AND [2] persists > 3 days  Answer Assessment - Initial Assessment Questions 1. DESCRIPTION: Describe how you are feeling.     Patient reports generally not feeling well.  2. SEVERITY: How bad is it?  Can you stand and walk?     Patient is mobile and able to get around but nurse states you can tell she doesn't feel well. 3. ONSET: When did these symptoms begin? (e.g., hours, days, weeks, months)     Six days of antibiotics-generally not feeling well. Not a huge change from when she went to the ED on 02/10/2024 4. CAUSE: What do you think is causing the weakness or fatigue? (e.g., not drinking enough fluids, medical problem, trouble sleeping)     Possible medical problem 5. NEW MEDICINES:  Have you started on any new medicines recently? (e.g., opioid pain medicines, benzodiazepines, muscle relaxants,  antidepressants, antihistamines, neuroleptics, beta blockers)     Levofloxacin  6. OTHER SYMPTOMS: Do you have any other symptoms? (e.g., chest pain, fever, cough, SOB, vomiting, diarrhea, bleeding, other areas of pain)     No there symptoms per [facility].   Silvia from [facility] called on behalf of patient. Patient was sent to ED on 02/10/2024 and diagnosed with UTI. Patient was placed on antibiotics-last dose is tonight. Patient reported to Silvia that she doesn't feel any better. Asked Auston to call Dr. Steven office to make sure she is on the correct medication. Patient is scheduled for a follow up appointment with PCP on Thursday. [facility] is asking for a call back with any additional recommendations.  Protocols used: Weakness (Generalized) and Fatigue-A-AH

## 2024-02-18 ENCOUNTER — Ambulatory Visit (INDEPENDENT_AMBULATORY_CARE_PROVIDER_SITE_OTHER): Admitting: Gastroenterology

## 2024-02-18 ENCOUNTER — Encounter (INDEPENDENT_AMBULATORY_CARE_PROVIDER_SITE_OTHER): Payer: Self-pay | Admitting: Gastroenterology

## 2024-02-18 VITALS — BP 168/75 | HR 81 | Temp 97.6°F | Ht 68.0 in | Wt 169.5 lb

## 2024-02-18 DIAGNOSIS — Z8619 Personal history of other infectious and parasitic diseases: Secondary | ICD-10-CM | POA: Insufficient documentation

## 2024-02-18 DIAGNOSIS — R197 Diarrhea, unspecified: Secondary | ICD-10-CM | POA: Insufficient documentation

## 2024-02-18 DIAGNOSIS — R194 Change in bowel habit: Secondary | ICD-10-CM | POA: Diagnosis not present

## 2024-02-18 DIAGNOSIS — R9389 Abnormal findings on diagnostic imaging of other specified body structures: Secondary | ICD-10-CM | POA: Insufficient documentation

## 2024-02-18 DIAGNOSIS — K219 Gastro-esophageal reflux disease without esophagitis: Secondary | ICD-10-CM

## 2024-02-18 DIAGNOSIS — K551 Chronic vascular disorders of intestine: Secondary | ICD-10-CM

## 2024-02-18 MED ORDER — PSYLLIUM 58.6 % PO PACK: 1.0000 | PACK | Freq: Two times a day (BID) | ORAL | 2 refills | Status: DC

## 2024-02-18 MED ORDER — PANTOPRAZOLE SODIUM 40 MG PO TBEC: 40.0000 mg | DELAYED_RELEASE_TABLET | Freq: Every day | ORAL | 3 refills | Status: DC

## 2024-02-18 NOTE — Progress Notes (Signed)
 [name] , M.D. Gastroenterology & Hepatology [facility] Gastroenterology [address] [place], [place] [address] Primary Care Physician: [name], MD [address] [place] [address]  Chief Complaint:  altered bowel movement  History of Present Illness: [name] is a 89 y.o. female with DM, HTN , ischemia status post stent placement who presents for evaluation of altered bowel movement  Patient was seen last year by GI inpatient where she was admitted for abdominal pain and poor oral intake.  At that time EGD was negative.  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  Patient significantly improved after that. Recently was given levofloxacin  for UTI and since then her bowel movements have been irregular.  Reports liquid stool followed up with hard pebble-like stools.  Patient does have lower abdominal discomfort Patient reports epigastric burning which had previously improved with sucralfate   Last EGD:04/2023 - Non- obstructing Schatzki ring. - Erythematous mucosa in the antrum. Biopsied. - 2 cm hiatal hernia. - Normal duodenal bulb and second portion of the duodenum.  Past Medical History: Past Medical History:  Diagnosis Date   Blood transfusion without reported diagnosis    Cognitive dysfunction 03/04/2019   Patient scores 21 out of 30 on [place] cognitive assessment September 2020   Diabetes mellitus without complication (HCC)    Diabetic peripheral neuropathy associated with type 2 diabetes mellitus (HCC) 02/25/2022   Diverticulitis    Frailty 03/04/2019   H/O bilateral breast reduction surgery    Hyperlipidemia    a. intolerant to statins.    Hypertension     Past Surgical History: Past Surgical History:  Procedure Laterality Date   ABDOMINAL HYSTERECTOMY  2003   APPENDECTOMY     age 52   BIOPSY  04/10/2023   Procedure: BIOPSY;  Surgeon: Cinderella Deatrice FALCON, MD;   Location: [facility];  Service: Endoscopy;;   BREAST REDUCTION SURGERY     age 65   COLON SURGERY Left 2011   Hemicolectomy due to diverticulitis   ESOPHAGOGASTRODUODENOSCOPY (EGD) WITH PROPOFOL  N/A 04/10/2023   Procedure: ESOPHAGOGASTRODUODENOSCOPY (EGD) WITH PROPOFOL ;  Surgeon: Cinderella Deatrice FALCON, MD;  Location: [facility];  Service: Endoscopy;  Laterality: N/A;   EYE SURGERY  03/30/2009   cataract   IR ANGIOGRAM VISCERAL SELECTIVE  04/11/2023   IR INTRAVASCULAR ULTRASOUND NON CORONARY  04/11/2023   IR TRANSCATH PLC STENT 1ST ART NOT LE CV CAR VERT CAR  04/11/2023   IR US  GUIDE VASC ACCESS RIGHT  04/11/2023   KNEE SURGERY Right 2003   knee cap   LAPAROSCOPIC INCISIONAL / UMBILICAL / VENTRAL HERNIA REPAIR  02/23/2007   REDUCTION MAMMAPLASTY Bilateral 2001   REFRACTIVE SURGERY     TOTAL KNEE ARTHROPLASTY Right 04/10/2022   Procedure: TOTAL KNEE ARTHROPLASTY;  Surgeon: Gerome Charleston, MD;  Location: [facility];  Service: Orthopedics;  Laterality: Right;  adductor canal    Family History: Family History  Problem Relation Age of Onset   Cancer Mother        ovaian   Hypertension Father        kidney   Stroke Father    Cancer Father    Hypertension Sister    Diabetes Brother     Social History: Social History   Tobacco Use  Smoking Status Never  Smokeless Tobacco Never   Social History   Substance and Sexual Activity  Alcohol  Use No   Social History  Substance and Sexual Activity  Drug Use No    Allergies: Allergies  Allergen Reactions   Statins Other (See Comments)    Severe myalgias   Phenergan [Promethazine Hcl] Other (See Comments)    Hallucinations    Hydrocortisone Itching   Lisinopril Other (See Comments)    Lethargy, Fatigue   Prednisone  Rash   Trazodone  And Nefazodone Cough    Medications: Current Outpatient Medications  Medication Sig Dispense Refill   acetaminophen  (TYLENOL ) 325 MG tablet TAKE (2) TABLETS BY MOUTH EVERY SIX HOURS AS  NEEDED FOR PAIN. MAX 3GM IN 24 HOURS. 120 tablet 5   albuterol  (VENTOLIN  HFA) 108 (90 Base) MCG/ACT inhaler INHALE 2 PUFFS INTO THE LUNGS EVERY SIX HOURS AS NEEDED FOR WHEEZING OR SHORTNESS OF BREATH. 8.5 g 1   ALPRAZolam  (XANAX ) 0.25 MG tablet Take 1 tablet (0.25 mg total) by mouth at bedtime. 10 tablet 0   amLODipine  (NORVASC ) 10 MG tablet TAKE (1) TABLET BY MOUTH ONCE DAILY. 30 tablet 5   aspirin  (ASPIRIN  LOW DOSE) 81 MG chewable tablet CHEW (1) TABLET BY MOUTH ONCE DAILY. 30 tablet 12   beta carotene (BETA CAROTENE PROVITAMIN A) 25000 UNIT capsule TAKE (1) CAPSULE BY MOUTH AT BEDTIME. 30 capsule 3   BISACODYL  5 MG EC tablet TAKE 1 TABLET BY MOUTH ONCE DAILY AS NEEDED FOR MODERATE CONSTIPATION. 30 tablet 0   cyanocobalamin  1000 MCG tablet Take 1,000 mcg by mouth at bedtime.     cycloSPORINE  (RESTASIS ) 0.05 % ophthalmic emulsion Place 1 drop into both eyes 2 (two) times daily.     dicyclomine  (BENTYL ) 10 MG capsule TAKE 1 CAPSULE BY MOUTH TWICE DAILY AS NEEDED FOR ABDOMINAL CRAMPS AND LOOSE STOOL. 60 capsule 0   estradiol  (ESTRACE ) 0.1 MG/GM vaginal cream Discard plastic applicator. Insert a blueberry size amount (approximately 1 gram) of cream on fingertip inside vagina at bedtime every night for 1 week then every other night. For long term use. 30 g 3   glucose blood (EASYMAX TEST) test strip CHECK BLOOD SUGAR ONCE DAILY.(CALL MD IF BS BELOW 60: OR IF BS ABOVE 400) 50 strip 3   hydrALAZINE  (APRESOLINE ) 25 MG tablet Take 1 tablet (25 mg total) by mouth 3 (three) times daily. 90 tablet 5   IVIZIA DRY EYES 0.5 % SOLN Place 1 drop into both eyes 2 (two) times daily.     levofloxacin  (LEVAQUIN ) 750 MG tablet Take 750 mg by mouth daily.     loratadine  (ALLERGY RELIEF) 10 MG tablet TAKE (1) TABLET BY MOUTH ONCE DAILY. (Patient taking differently: Take 10 mg by mouth daily.) 30 tablet 5   Melatonin 10 MG TABS Take 10 mg by mouth at bedtime. 30 tablet 3   metFORMIN  (GLUCOPHAGE ) 500 MG tablet TAKE (1)  TABLET BY MOUTH IN THE MORNING & (1/2) TABLET (250MG ) BY MOUTH AT SUPPER 45 tablet 5   methenamine  (HIPREX ) 1 g tablet Take 1 tablet (1 g total) by mouth 2 (two) times daily with a meal. Most effective when taken with a daily Vitamin C supplement. 60 tablet 11   Multiple Vitamins-Minerals (PRESERVISION AREDS 2) CAPS Take 1 capsule by mouth in the morning and at bedtime.     ondansetron  (ZOFRAN -ODT) 4 MG disintegrating tablet 4mg  ODT q4 hours prn nausea/vomit 10 tablet 0   pantoprazole  (PROTONIX ) 40 MG tablet Take 1 tablet (40 mg total) by mouth 2 (two) times daily before a meal. 60 tablet 3   phenazopyridine  (PYRIDIUM ) 100 MG tablet Take 1 tablet (100  mg total) by mouth 3 (three) times daily. 6 tablet 0   polyethylene glycol powder (GOODSENSE CLEARLAX) 17 GM/SCOOP powder 1 scoop in 8 ounces water  once daily as needed for constipation (Patient taking differently: Take 17 g by mouth daily as needed for mild constipation. 1 scoop in 8 ounces water  once daily as needed for constipation) 510 g 1   Safety Lancets 28G MISC CHECK BLOOD SUGAR ONCE DAILY. 100 each 0   sertraline  (ZOLOFT ) 25 MG tablet TAKE (1) TABLET BY MOUTH ONCE DAILY. (Patient taking differently: Take 25 mg by mouth daily.) 30 tablet 5   sodium chloride  1 g tablet TAKE (1) TABLET BY MOUTH TWICE DAILY WITH A MEAL. 60 tablet 0   sucralfate  (CARAFATE ) 1 g tablet Crush and mix with 30 ml of water  three times a day for 2 weeks 42 tablet 0   tamsulosin  (FLOMAX ) 0.4 MG CAPS capsule Take 1 capsule (0.4 mg total) by mouth daily. 30 capsule 2   valsartan  (DIOVAN ) 320 MG tablet Take 1 tablet (320 mg total) by mouth daily. 30 tablet 5   Vitamin D , Cholecalciferol , 10 MCG (400 UNIT) TABS TAKE (1) TABLET BY MOUTH ONCE DAILY. (Patient taking differently: Take 1 tablet by mouth daily.) 32 tablet 5   No current facility-administered medications for this visit.    Review of Systems: GENERAL: negative for malaise, night sweats HEENT: No changes in hearing  or vision, no nose bleeds or other nasal problems. NECK: Negative for lumps, goiter, pain and significant neck swelling RESPIRATORY: Negative for cough, wheezing CARDIOVASCULAR: Negative for chest pain, leg swelling, palpitations, orthopnea GI: SEE HPI MUSCULOSKELETAL: Negative for joint pain or swelling, back pain, and muscle pain. SKIN: Negative for lesions, rash HEMATOLOGY Negative for prolonged bleeding, bruising easily, and swollen nodes. ENDOCRINE: Negative for cold or heat intolerance, polyuria, polydipsia and goiter. NEURO: negative for tremor, gait imbalance, syncope and seizures. The remainder of the review of systems is noncontributory.   Physical Exam: BP (!) 173/68   Pulse 81   Temp 97.6 F (36.4 C)   Ht 5' 8 (1.727 m)   Wt 169 lb 8 oz (76.9 kg)   BMI 25.77 kg/m  GENERAL: The patient is AO x3, in no acute distress. HEENT: Head is normocephalic and atraumatic. EOMI are intact. Mouth is well hydrated and without lesions. NECK: Supple. No masses LUNGS: Clear to auscultation. No presence of rhonchi/wheezing/rales. Adequate chest expansion HEART: RRR, normal s1 and s2. ABDOMEN: Soft, nontender, no guarding, no peritoneal signs, and nondistended. BS +. No masses.  Imaging/Labs: as above     Latest Ref Rng & Units 02/10/2024    5:00 PM 01/02/2024    3:04 PM 12/29/2023    3:25 AM  CBC  WBC 4.0 - 10.5 K/uL 7.1  6.9  6.1   Hemoglobin 12.0 - 15.0 g/dL 87.8  88.7  89.0   Hematocrit 36.0 - 46.0 % 36.8  34.9  32.8   Platelets 150 - 400 K/uL 295  309  305    Lab Results  Component Value Date   IRON  49 03/13/2023   TIBC 329 03/13/2023   FERRITIN 33 02/11/2023    I personally reviewed and interpreted the available labs, imaging and endoscopic files.  Impression and Plan: [name] is a 89 y.o. female with DM, HTN , ischemia status post stent placement who presents for evaluation of altered bowel movement  #Altered bowel movements #Abdominal discomfort   #GERD  Patient has history of C. difficile, recently  was given antibiotics and bowel movement has been altered since.  It could be antibiotic related diarrhea but need to rule out C. Difficile.  Will send stool sample  Given occasional hard stool this could be overflow diarrhea, will obtain abdominal x-ray to assess stool burden , if moderate or large burden is found we will give bowel purge.  For now we will add Metamucil to regimen  Patient had epigastric burning improved with sucralfate .  Last upper endoscopy last year.  Will give PPI fo sincer now  Placement of SMA stent for mesenteric ischemia last year , patient has not followed with interventional radiology.  I will order surveillance ultrasound to assess vascular patency.  Advised patient to follow-up after that   All questions were answered.      Lamika Connolly Faizan Loyal Holzheimer, MD Gastroenterology and Hepatology [facility]   This chart has been completed using [facility] Dictation software, and while attempts have been made to ensure accuracy , certain words and phrases may not be transcribed as intended

## 2024-02-18 NOTE — Patient Instructions (Signed)
 It was very nice to meet you today, as dicussed with will plan for the following :  1) Abdominal xray  2) Metamucil daily  3) ultrasound   4) Stool sample for C Diff

## 2024-02-19 ENCOUNTER — Ambulatory Visit (INDEPENDENT_AMBULATORY_CARE_PROVIDER_SITE_OTHER): Admitting: Family Medicine

## 2024-02-19 VITALS — BP 130/64 | HR 87 | Temp 98.6°F | Ht 68.0 in | Wt 181.2 lb

## 2024-02-19 DIAGNOSIS — E1159 Type 2 diabetes mellitus with other circulatory complications: Secondary | ICD-10-CM | POA: Diagnosis not present

## 2024-02-19 DIAGNOSIS — Z7984 Long term (current) use of oral hypoglycemic drugs: Secondary | ICD-10-CM

## 2024-02-19 DIAGNOSIS — I700 Atherosclerosis of aorta: Secondary | ICD-10-CM | POA: Diagnosis not present

## 2024-02-19 DIAGNOSIS — E871 Hypo-osmolality and hyponatremia: Secondary | ICD-10-CM

## 2024-02-19 DIAGNOSIS — R300 Dysuria: Secondary | ICD-10-CM

## 2024-02-19 DIAGNOSIS — I10 Essential (primary) hypertension: Secondary | ICD-10-CM

## 2024-02-19 LAB — POCT URINALYSIS DIP (CLINITEK)
Bilirubin, UA: NEGATIVE
Blood, UA: NEGATIVE
Glucose, UA: NEGATIVE mg/dL
Ketones, POC UA: NEGATIVE mg/dL
Nitrite, UA: NEGATIVE
POC PROTEIN,UA: 30 — AB
Spec Grav, UA: <=1.005 — AB (ref 1.010–1.025)
Urobilinogen, UA: 0.2 U/dL
pH, UA: 6 (ref 5.0–8.0)

## 2024-02-19 NOTE — Telephone Encounter (Signed)
 Spoke with Silivia, patient has appt this afternoon. Will submit urine for recheck

## 2024-02-19 NOTE — Progress Notes (Unsigned)
   Subjective:    Patient ID: [name], female    DOB: [birth year], 89 y.o.   MRN: [id number]  HPI Patient has had UTI recently Was treated with antibiotics Has had diarrhea related to antibiotics She has a stool test coming up She is also has a special ultrasound to look at the arteries An x-ray She feels like she is having regular bowel movements She is on stool softeners Metamucil and occasional MiraLAX  Her energy level is low she is weak She is able to walk about 600 steps a day Part of this are related to deconditioning heart of this related to age part of this related to multiple health issues Medication list were reviewed in detail She is trying to adhere to a low sugar diet She is compliant with her other medicines Blood pressure is a bit elevated in other places but today her blood pressure looks good   Review of Systems     Objective:   Physical Exam General-in no acute distress Eyes-no discharge Lungs-respiratory rate normal, CTA CV-no murmurs,RRR Extremities skin warm dry no edema Neuro grossly normal Behavior normal, alert        Assessment & Plan:  1. Dysuria (Primary) No sign of UTI under the microscope - POCT URINALYSIS DIP (CLINITEK)  2. Essential hypertension Blood pressure decent control continue current measures  3. 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 Hyponatremia Avoid overhydration check kidney function before follow-up  Follow-up 4 months  ER notes were reviewed in detail

## 2024-02-19 NOTE — Telephone Encounter (Signed)
 Mauro Elveria BROCKS, NP      02/16/24  5:12 PM Levaquin  is very strong and usually covers UTIs very well. Also her culture shows it is sensitive to this class of medications. See if she is having any severe symptoms. I can call in a second antibiotic.

## 2024-02-20 ENCOUNTER — Ambulatory Visit: Payer: Self-pay | Admitting: Family Medicine

## 2024-02-20 DIAGNOSIS — Z8619 Personal history of other infectious and parasitic diseases: Secondary | ICD-10-CM | POA: Diagnosis not present

## 2024-02-22 DIAGNOSIS — F5101 Primary insomnia: Secondary | ICD-10-CM | POA: Diagnosis not present

## 2024-02-22 DIAGNOSIS — F419 Anxiety disorder, unspecified: Secondary | ICD-10-CM | POA: Diagnosis not present

## 2024-02-22 DIAGNOSIS — F331 Major depressive disorder, recurrent, moderate: Secondary | ICD-10-CM | POA: Diagnosis not present

## 2024-02-24 ENCOUNTER — Ambulatory Visit (HOSPITAL_COMMUNITY)

## 2024-02-24 ENCOUNTER — Emergency Department (HOSPITAL_COMMUNITY)

## 2024-02-24 ENCOUNTER — Other Ambulatory Visit: Payer: Self-pay | Admitting: Family Medicine

## 2024-02-24 ENCOUNTER — Other Ambulatory Visit: Payer: Self-pay

## 2024-02-24 ENCOUNTER — Encounter (HOSPITAL_COMMUNITY): Payer: Self-pay | Admitting: Emergency Medicine

## 2024-02-24 ENCOUNTER — Emergency Department (HOSPITAL_COMMUNITY)
Admission: EM | Admit: 2024-02-24 | Discharge: 2024-02-25 | Disposition: A | Discharge status: Home / Self Care | Source: Skilled Nursing Facility | Attending: Emergency Medicine | Admitting: Emergency Medicine

## 2024-02-24 ENCOUNTER — Other Ambulatory Visit: Payer: Self-pay | Admitting: Physician Assistant

## 2024-02-24 DIAGNOSIS — R1030 Lower abdominal pain, unspecified: Secondary | ICD-10-CM | POA: Insufficient documentation

## 2024-02-24 DIAGNOSIS — I10 Essential (primary) hypertension: Secondary | ICD-10-CM | POA: Diagnosis not present

## 2024-02-24 DIAGNOSIS — Z7984 Long term (current) use of oral hypoglycemic drugs: Secondary | ICD-10-CM | POA: Insufficient documentation

## 2024-02-24 DIAGNOSIS — E119 Type 2 diabetes mellitus without complications: Secondary | ICD-10-CM | POA: Insufficient documentation

## 2024-02-24 DIAGNOSIS — R1084 Generalized abdominal pain: Secondary | ICD-10-CM | POA: Diagnosis not present

## 2024-02-24 DIAGNOSIS — Z7982 Long term (current) use of aspirin: Secondary | ICD-10-CM | POA: Insufficient documentation

## 2024-02-24 LAB — TYPE AND SCREEN
ABO/RH(D): O POS
Antibody Screen: NEGATIVE

## 2024-02-24 LAB — CBC WITH DIFFERENTIAL/PLATELET
Abs Immature Granulocytes: 0.04 K/uL (ref 0.00–0.07)
Basophils Absolute: 0 K/uL (ref 0.0–0.1)
Basophils Relative: 1 %
Eosinophils Absolute: 0.2 K/uL (ref 0.0–0.5)
Eosinophils Relative: 3 %
HCT: 34.1 % — ABNORMAL LOW (ref 36.0–46.0)
Hemoglobin: 10.8 g/dL — ABNORMAL LOW (ref 12.0–15.0)
Immature Granulocytes: 1 %
Lymphocytes Relative: 19 %
Lymphs Abs: 1.6 K/uL (ref 0.7–4.0)
MCH: 27.8 pg (ref 26.0–34.0)
MCHC: 31.7 g/dL (ref 30.0–36.0)
MCV: 87.9 fL (ref 80.0–100.0)
Monocytes Absolute: 0.6 K/uL (ref 0.1–1.0)
Monocytes Relative: 7 %
Neutro Abs: 6.1 K/uL (ref 1.7–7.7)
Neutrophils Relative %: 69 %
Platelets: 285 K/uL (ref 150–400)
RBC: 3.88 MIL/uL (ref 3.87–5.11)
RDW: 13.3 % (ref 11.5–15.5)
WBC: 8.6 K/uL (ref 4.0–10.5)
nRBC: 0 % (ref 0.0–0.2)

## 2024-02-24 LAB — COMPREHENSIVE METABOLIC PANEL WITH GFR
ALT: 13 U/L (ref 0–44)
AST: 19 U/L (ref 15–41)
Albumin: 3.6 g/dL (ref 3.5–5.0)
Alkaline Phosphatase: 90 U/L (ref 38–126)
Anion gap: 11 (ref 5–15)
BUN: 16 mg/dL (ref 8–23)
CO2: 26 mmol/L (ref 22–32)
Calcium: 9.3 mg/dL (ref 8.9–10.3)
Chloride: 98 mmol/L (ref 98–111)
Creatinine, Ser: 0.77 mg/dL (ref 0.44–1.00)
GFR, Estimated: >60 mL/min (ref >60)
Glucose, Bld: 138 mg/dL — ABNORMAL HIGH (ref 70–99)
Potassium: 4.3 mmol/L (ref 3.5–5.1)
Sodium: 135 mmol/L (ref 135–145)
Total Bilirubin: 0.8 mg/dL (ref 0.0–1.2)
Total Protein: 6.5 g/dL (ref 6.5–8.1)

## 2024-02-24 LAB — C DIFFICILE, CYTOTOXIN B

## 2024-02-24 LAB — LIPASE, BLOOD: Lipase: 28 U/L (ref 11–51)

## 2024-02-24 LAB — C DIFFICILE TOXINS A+B W/RFLX: C difficile Toxins A+B, EIA: NEGATIVE

## 2024-02-24 MED ORDER — HYDROXYZINE HCL 25 MG PO TABS
50.0000 mg | ORAL_TABLET | Freq: Once | ORAL | Status: AC
  Administered 2024-02-25: 50 mg via ORAL
  Filled 2024-02-24: qty 2

## 2024-02-24 MED ORDER — PANTOPRAZOLE SODIUM 40 MG IV SOLR
40.0000 mg | Freq: Once | INTRAVENOUS | Status: AC
  Administered 2024-02-24: 40 mg via INTRAVENOUS
  Filled 2024-02-24: qty 10

## 2024-02-24 MED ORDER — DICYCLOMINE HCL 20 MG PO TABS: ORAL_TABLET | ORAL | 0 refills | Status: DC

## 2024-02-24 MED ORDER — DICYCLOMINE HCL 10 MG PO CAPS
10.0000 mg | ORAL_CAPSULE | Freq: Once | ORAL | Status: AC
  Administered 2024-02-24: 10 mg via ORAL
  Filled 2024-02-24: qty 1

## 2024-02-24 MED ORDER — HYDROXYZINE HCL 25 MG PO TABS
ORAL_TABLET | ORAL | 0 refills | Status: DC
Start: 2024-02-24 — End: 2024-03-26

## 2024-02-24 MED ORDER — SODIUM CHLORIDE 0.9 % IV BOLUS
1000.0000 mL | Freq: Once | INTRAVENOUS | Status: AC
  Administered 2024-02-24: 1000 mL via INTRAVENOUS

## 2024-02-24 MED ORDER — ONDANSETRON HCL 4 MG/2ML IJ SOLN
4.0000 mg | Freq: Once | INTRAMUSCULAR | Status: AC
  Administered 2024-02-24: 4 mg via INTRAVENOUS
  Filled 2024-02-24: qty 2

## 2024-02-24 MED ORDER — IOHEXOL 300 MG/ML  SOLN
100.0000 mL | Freq: Once | INTRAMUSCULAR | Status: AC | PRN
  Administered 2024-02-24: 100 mL via INTRAVENOUS

## 2024-02-24 NOTE — Discharge Instructions (Signed)
 Follow up with your doctor next week

## 2024-02-24 NOTE — ED Triage Notes (Signed)
 Pt was evaluated at GI office Friday and tested for C-Diff with no results available. Pt c/o painful bowel movements and abd pain. She would like to be admitted for C-Diff per EMS.

## 2024-02-25 ENCOUNTER — Ambulatory Visit (INDEPENDENT_AMBULATORY_CARE_PROVIDER_SITE_OTHER): Payer: Self-pay | Admitting: Gastroenterology

## 2024-02-25 ENCOUNTER — Telehealth: Payer: Self-pay | Admitting: Family Medicine

## 2024-02-25 DIAGNOSIS — R1030 Lower abdominal pain, unspecified: Secondary | ICD-10-CM | POA: Diagnosis not present

## 2024-02-25 NOTE — Telephone Encounter (Signed)
 Patient was seen in the ER worried with abdominal pain CAT scan looked good was diagnosed with UTI sent home on antibiotics was also given hydroxyzine  to take at night Given that she is already on a low-dose Xanax  at night I would not recommend both DC hydroxyzine  Prescription written out staff will fax it to her care center patient has follow-up next week follow-up sooner if any problems

## 2024-02-25 NOTE — ED Provider Notes (Signed)
 [place] EMERGENCY DEPARTMENT AT [facility] Provider Note   CSN: [id number] Arrival date & time: 02/24/24  1805     Patient presents with: Abdominal Pain   [name] is a 89 y.o. female.   Patient complains of abdominal pain and some loose stools.  She was recently seen by GI and had a negative C. difficile.  Patient has a history of diabetes and GERD  The history is provided by the patient and medical records. No language interpreter was used.  Abdominal Pain Pain location:  Generalized Pain quality: aching   Pain radiates to:  Does not radiate Pain severity:  Mild Onset quality:  Sudden Timing:  Intermittent Progression:  Waxing and waning Chronicity:  New Context: not alcohol  use   Relieved by:  Nothing Worsened by:  Nothing Associated symptoms: diarrhea   Associated symptoms: no chest pain, no cough, no fatigue and no hematuria        Prior to Admission medications   Medication Sig Start Date End Date Taking? Authorizing Provider  dicyclomine  (BENTYL ) 20 MG tablet Take 1 every 8 hours as needed for abdominal cramping 02/24/24  Yes Suzette Pac, MD  hydrOXYzine  (ATARAX ) 25 MG tablet Take 1 at bedtime to help with sleep 02/24/24  Yes Ridgely Anastacio, MD  acetaminophen  (TYLENOL ) 325 MG tablet TAKE (2) TABLETS BY MOUTH EVERY SIX HOURS AS NEEDED FOR PAIN. MAX 3GM IN 24 HOURS. 07/28/23   [name], MD  albuterol  (VENTOLIN  HFA) 108 (90 Base) MCG/ACT inhaler INHALE 2 PUFFS INTO THE LUNGS EVERY SIX HOURS AS NEEDED FOR WHEEZING OR SHORTNESS OF BREATH. 09/26/23   [name], MD  ALPRAZolam  (XANAX ) 0.25 MG tablet TAKE (1) TABLET BY MOUTH AT BEDTIME FOR SLEEP. 02/25/24   [name], MD  amLODipine  (NORVASC ) 10 MG tablet TAKE (1) TABLET BY MOUTH ONCE DAILY. 10/07/23   [name], MD  aspirin  (ASPIRIN  LOW DOSE) 81 MG chewable tablet CHEW (1) TABLET BY MOUTH ONCE DAILY. 09/06/23   [name], MD  BETA CAROTENE PROVITAMIN A 25000 units capsule TAKE  (1) CAPSULE BY MOUTH AT BEDTIME. 02/24/24   Luking, Glendia LABOR, MD  BISACODYL  5 MG EC tablet TAKE 1 TABLET BY MOUTH ONCE DAILY AS NEEDED FOR MODERATE CONSTIPATION. 12/20/22   [name], MD  cyanocobalamin  1000 MCG tablet Take 1,000 mcg by mouth at bedtime.    [provider]  cycloSPORINE  (RESTASIS ) 0.05 % ophthalmic emulsion Place 1 drop into both eyes 2 (two) times daily.    [provider]  estradiol  (ESTRACE ) 0.1 MG/GM vaginal cream Discard plastic applicator. Insert a blueberry size amount (approximately 1 gram) of cream on fingertip inside vagina at bedtime every night for 1 week then every other night. For long term use. 01/15/24   Gerldine Lauraine BROCKS, FNP  glucose blood (EASYMAX TEST) test strip CHECK BLOOD SUGAR ONCE DAILY.(CALL MD IF BS BELOW 60: OR IF BS ABOVE 400) 10/07/23   Luking, Glendia LABOR, MD  hydrALAZINE  (APRESOLINE ) 25 MG tablet Take 1 tablet (25 mg total) by mouth 3 (three) times daily. 12/31/23   Luking, Glendia LABOR, MD  IVIZIA DRY EYES 0.5 % SOLN Place 1 drop into both eyes 2 (two) times daily. 09/22/23   [provider]  levofloxacin  (LEVAQUIN ) 750 MG tablet Take 750 mg by mouth daily.    [provider]  loratadine  (ALLERGY RELIEF) 10 MG tablet TAKE (1) TABLET BY MOUTH ONCE DAILY. Patient taking differently: Take 10 mg  by mouth daily. 10/07/23   [name], MD  Melatonin 10 MG TABS Take 10 mg by mouth at bedtime. 09/25/23   Cook, Jayce G, DO  metFORMIN  (GLUCOPHAGE ) 500 MG tablet TAKE (1) TABLET BY MOUTH IN THE MORNING & (1/2) TABLET (250MG ) BY MOUTH AT SUPPER 10/07/23   Luking, Glendia LABOR, MD  methenamine  (HIPREX ) 1 g tablet Take 1 tablet (1 g total) by mouth 2 (two) times daily with a meal. Most effective when taken with a daily Vitamin C supplement. 01/15/24   Gerldine Lauraine BROCKS, FNP  Multiple Vitamins-Minerals (PRESERVISION AREDS 2) CAPS Take 1 capsule by mouth in the morning and at bedtime.    [provider]  ondansetron  (ZOFRAN -ODT) 4 MG  disintegrating tablet 4mg  ODT q4 hours prn nausea/vomit 09/05/23   Real Cona, MD  pantoprazole  (PROTONIX ) 40 MG tablet Take 1 tablet (40 mg total) by mouth 2 (two) times daily before a meal. 12/24/23   Cook, Jayce G, DO  pantoprazole  (PROTONIX ) 40 MG tablet Take 1 tablet (40 mg total) by mouth daily. 02/18/24   Ahmed, Deatrice FALCON, MD  phenazopyridine  (PYRIDIUM ) 100 MG tablet Take 1 tablet (100 mg total) by mouth 3 (three) times daily. 01/02/24   Suellen Cantor A, PA-C  polyethylene glycol powder (GOODSENSE CLEARLAX) 17 GM/SCOOP powder 1 scoop in 8 ounces water  once daily as needed for constipation Patient taking differently: Take 17 g by mouth daily as needed for mild constipation. 1 scoop in 8 ounces water  once daily as needed for constipation 06/03/23   Luking, Scott A, MD  psyllium (METAMUCIL) 58.6 % packet Take 1 packet by mouth 2 (two) times daily. 02/18/24 05/18/24  Ahmed, Muhammad F, MD  Safety Lancets 28G MISC CHECK BLOOD SUGAR ONCE DAILY. 01/21/24   [name], MD  sertraline  (ZOLOFT ) 25 MG tablet TAKE (1) TABLET BY MOUTH ONCE DAILY. Patient taking differently: Take 25 mg by mouth daily. 10/07/23   [name], MD  sodium chloride  1 g tablet TAKE (1) TABLET BY MOUTH TWICE DAILY WITH A MEAL. 01/26/24   [name], MD  sucralfate  (CARAFATE ) 1 g tablet Crush and mix with 30 ml of water  three times a day for 2 weeks 01/13/24   [name], MD  tamsulosin  (FLOMAX ) 0.4 MG CAPS capsule Take 1 capsule (0.4 mg total) by mouth daily. 12/29/23   Maree, Pratik D, DO  valsartan  (DIOVAN ) 320 MG tablet Take 1 tablet (320 mg total) by mouth daily. 12/22/23   [name], MD  Vitamin D , Cholecalciferol , 10 MCG (400 UNIT) TABS TAKE (1) TABLET BY MOUTH ONCE DAILY. Patient taking differently: Take 1 tablet by mouth daily. 10/07/23   [name], MD    Allergies: Statins, Phenergan [promethazine hcl], Hydrocortisone, Lisinopril, Prednisone , and Trazodone  and nefazodone    Review of Systems   Constitutional:  Negative for appetite change and fatigue.  HENT:  Negative for congestion, ear discharge and sinus pressure.   Eyes:  Negative for discharge.  Respiratory:  Negative for cough.   Cardiovascular:  Negative for chest pain.  Gastrointestinal:  Positive for abdominal pain and diarrhea.  Genitourinary:  Negative for frequency and hematuria.  Musculoskeletal:  Negative for back pain.  Skin:  Negative for rash.  Neurological:  Negative for seizures and headaches.  Psychiatric/Behavioral:  Negative for hallucinations.     Updated Vital Signs BP (!) 186/62 (BP Location: Left Arm)   Pulse 97   Temp 98.7 F (37.1 C) (Oral)  Resp 15   Ht 5' 8 (1.727 m)   Wt 82.2 kg   SpO2 98%   BMI 27.55 kg/m   Physical Exam Vitals and nursing note reviewed.  Constitutional:      Appearance: She is well-developed.  HENT:     Head: Normocephalic.     Nose: Nose normal.  Eyes:     General: No scleral icterus.    Conjunctiva/sclera: Conjunctivae normal.  Neck:     Thyroid : No thyromegaly.  Cardiovascular:     Rate and Rhythm: Normal rate and regular rhythm.     Heart sounds: No murmur heard.    No friction rub. No gallop.  Pulmonary:     Breath sounds: No stridor. No wheezing or rales.  Chest:     Chest wall: No tenderness.  Abdominal:     General: There is no distension.     Tenderness: There is abdominal tenderness. There is no rebound.  Musculoskeletal:        General: Normal range of motion.     Cervical back: Neck supple.  Lymphadenopathy:     Cervical: No cervical adenopathy.  Skin:    Findings: No erythema or rash.  Neurological:     Mental Status: She is alert and oriented to person, place, and time.     Motor: No abnormal muscle tone.     Coordination: Coordination normal.  Psychiatric:        Behavior: Behavior normal.     (all labs ordered are listed, but only abnormal results are displayed) Labs Reviewed  CBC WITH DIFFERENTIAL/PLATELET - Abnormal;  Notable for the following components:      Result Value   Hemoglobin 10.8 (*)    HCT 34.1 (*)    All other components within normal limits  COMPREHENSIVE METABOLIC PANEL WITH GFR - Abnormal; Notable for the following components:   Glucose, Bld 138 (*)    All other components within normal limits  LIPASE, BLOOD  TYPE AND SCREEN    EKG: None  Radiology: CT ABDOMEN PELVIS W CONTRAST Result Date: 02/24/2024 CLINICAL DATA:  Acute abdominal pain and painful bowel movements, initial encounter EXAM: CT ABDOMEN AND PELVIS WITH CONTRAST TECHNIQUE: Multidetector CT imaging of the abdomen and pelvis was performed using the standard protocol following bolus administration of intravenous contrast. RADIATION DOSE REDUCTION: This exam was performed according to the departmental dose-optimization program which includes automated exposure control, adjustment of the mA and/or kV according to patient size and/or use of iterative reconstruction technique. CONTRAST:  OMNIPAQUE  IOHEXOL  300 MG/ML  SOLN COMPARISON:  09/22/2023 FINDINGS: Lower chest: No acute abnormality. Hepatobiliary: Gallbladder is well distended with multiple dependent gallstones. No biliary ductal dilatation is seen. The liver is within normal limits. Pancreas: Unremarkable. No pancreatic ductal dilatation or surrounding inflammatory changes. Spleen: Normal in size without focal abnormality. Adrenals/Urinary Tract: Adrenal glands are within normal limits. Kidneys demonstrate a normal enhancement pattern bilaterally. No renal calculi or obstructive changes are seen. Ureters are within normal limits. The bladder is well distended. Stomach/Bowel: Scattered diverticular change of the colon is noted. No evidence of diverticulitis is seen. The appendix has been surgically removed. Small bowel and stomach appear within normal limits. Vascular/Lymphatic: Aortic atherosclerosis. No enlarged abdominal or pelvic lymph nodes. Reproductive: Status post  hysterectomy. No adnexal masses. Other: Postsurgical changes are noted in the anterior abdominal wall with-placement consistent with hernia repair. No recurrent hernia is noted. Musculoskeletal: No acute or significant osseous findings. IMPRESSION: Cholelithiasis without complicating factors. Diverticulosis without  diverticulitis. No acute abnormality is identified to correspond with the given clinical history. Electronically Signed   By: Oneil Devonshire M.D.   On: 02/24/2024 22:04     Procedures   Medications Ordered in the ED  sodium chloride  0.9 % bolus 1,000 mL (0 mLs Intravenous Stopped 02/24/24 1939)  pantoprazole  (PROTONIX ) injection 40 mg (40 mg Intravenous Given 02/24/24 1859)  ondansetron  (ZOFRAN ) injection 4 mg (4 mg Intravenous Given 02/24/24 1857)  iohexol  (OMNIPAQUE ) 300 MG/ML solution 100 mL (100 mLs Intravenous Contrast Given 02/24/24 2135)  hydrOXYzine  (ATARAX ) tablet 50 mg (50 mg Oral Given 02/25/24 0036)  dicyclomine  (BENTYL ) capsule 10 mg (10 mg Oral Given 02/24/24 2356)   Labs unremarkable except for mild anemia.  CT scan shows some gallstones but no cholecystitis.  Patient improved with IV fluids and Protonix                                  Medical Decision Making Amount and/or Complexity of Data Reviewed Labs: ordered. Radiology: ordered.  Risk Prescription drug management.  Patient with abdominal pain and loose stools with negative C. difficile.  CT scan unremarkable.  Patient will be sent home with some Bentyl  and follow-up with either family doctor or GI    Final diagnoses:  Lower abdominal pain    ED Discharge Orders          Ordered    dicyclomine  (BENTYL ) 20 MG tablet        02/24/24 2307    hydrOXYzine  (ATARAX ) 25 MG tablet        02/24/24 2307               Suzette Pac, MD 02/25/24 1022

## 2024-02-25 NOTE — ED Notes (Signed)
 [facility] contacted to transport pt back to facility. Told it would be 30-40 minutes. Report given to Wayne at [facility].

## 2024-03-03 ENCOUNTER — Ambulatory Visit: Payer: Self-pay | Admitting: Family Medicine

## 2024-03-03 ENCOUNTER — Ambulatory Visit: Admitting: Family Medicine

## 2024-03-03 ENCOUNTER — Encounter: Payer: Self-pay | Admitting: Family Medicine

## 2024-03-03 VITALS — BP 132/54 | HR 84 | Temp 97.4°F | Ht 68.0 in | Wt 173.0 lb

## 2024-03-03 DIAGNOSIS — Z23 Encounter for immunization: Secondary | ICD-10-CM | POA: Diagnosis not present

## 2024-03-03 DIAGNOSIS — E1142 Type 2 diabetes mellitus with diabetic polyneuropathy: Secondary | ICD-10-CM | POA: Diagnosis not present

## 2024-03-03 DIAGNOSIS — R350 Frequency of micturition: Secondary | ICD-10-CM

## 2024-03-03 DIAGNOSIS — R21 Rash and other nonspecific skin eruption: Secondary | ICD-10-CM | POA: Diagnosis not present

## 2024-03-03 LAB — POCT URINE DIPSTICK
Bilirubin, UA: NEGATIVE
Blood, UA: NEGATIVE
Glucose, UA: NEGATIVE mg/dL
Ketones, POC UA: NEGATIVE mg/dL
Nitrite, UA: NEGATIVE
POC PROTEIN,UA: 30 — AB
Spec Grav, UA: 1.01 (ref 1.010–1.025)
Urobilinogen, UA: 0.2 U/dL
pH, UA: 6 (ref 5.0–8.0)

## 2024-03-03 MED ORDER — PREGABALIN 25 MG PO CAPS: ORAL_CAPSULE | ORAL | 5 refills | Status: DC

## 2024-03-03 MED ORDER — KETOCONAZOLE 2 % EX CREA: 1.0000 | TOPICAL_CREAM | Freq: Two times a day (BID) | CUTANEOUS | 4 refills | Status: DC

## 2024-03-03 NOTE — Progress Notes (Signed)
 Subjective:    Patient ID: [name], female    DOB: [birth year], 89 y.o.   MRN: [id number]  HPI ED follow up abd pain  Neuropathy Groin rash  Discussed the use of AI scribe software for clinical note transcription with the patient, who gave verbal consent to proceed.  History of Present Illness   SHERVON KERWIN is a 89 year old female who presents with difficulty urinating and foot pain.  She experiences intermittent difficulty initiating urination and frequent urination with small volumes. There is no burning sensation with urination, and no fever. She was recently in the ER on February 24, 2024, due to feeling 'totally exhausted,' where she was given fluids. Blood tests showed a hemoglobin level of 10.8, down from 12.1, but other tests including sodium, potassium, liver enzymes, creatinine, and pancreatic tests were normal. A CT scan showed gallstones but no diverticulitis or abscess. A C. diff test was negative. She takes a powder to mix with water  for bowel issues twice daily.  She describes a burning and itching rash on both sides, which she associates with the use of pull-ups. She has been using pull-ups and small pads regularly and changes them frequently. She requests treatment for the rash, which causes significant discomfort. No fever or systemic symptoms are reported.  She experiences burning and stinging pain in her feet, which affects her ability to walk. The pain is persistent, even when sitting, and sometimes she needs to remove her shoes for relief. The burning sensation is the primary issue, impacting her mobility.  She takes a blood pressure medication at 2 PM, which she associates with a burning sensation. Her blood pressure was noted to be 132/54, and her blood sugar was 124 in the morning, with a previous reading of 17. She is on a low-dose antibiotic prescribed by a urologist, although the specific name is not recalled. She also uses estrogen every other day, skipping  two days once a week. She is scheduled for an ultrasound of her stent next week to assess its function.  No fever, dizziness, or current diarrhea. She reports dark stools. She denies any recent ER visits except for one on February 24, 2024.     ER notes were reviewed Test will reveal Also labs were reviewed Urine was looked at under the microscope does have some WBCs but no bacteria Culture was sent    Review of Systems     Objective:   Physical Exam General-in no acute distress Eyes-no discharge Lungs-respiratory rate normal, CTA CV-no murmurs,RRR Extremities skin warm dry no edema Neuro grossly normal Behavior normal, alert Vaginal rash noted its mainly in the creases of the legs bilateral no sign of cellulitis  Diabetic foot exam was normal but she has symptoms of burning and pain in the feet     Assessment & Plan:  Frequency of micturition Intermittent difficulty initiating urination without infection. Urologist recommended low-dose estrogen therapy. Avoid frequent antibiotics due to C difficile risk. - Send urine for culture. Do not treat with antibiotics unless symptomatic with dysuria.  Peripheral neuropathy Burning and stinging pain in feet affecting ambulation. Medication may reduce burning but not joint discomfort. Risks include drowsiness or feeling loopy. - Start low-dose Lyrica  (pregabalin ) in the evening. Monitor for side effects and adjust as needed.  Diaper dermatitis Redness, burning, and itching in groin area likely due to pull-ups and pads. - Examine rash area with nurse assistance. - Prescribe diaper cream for topical application.  1. Urine frequency (  Primary) We will send a culture I tried to encourage her to avoid antibiotics unless there is true urinary tract infection with symptoms of a UTI otherwise it is more bacteria that I would not recommend pursuing treating unless symptoms occur she seems to understand - POCT URINE DIPSTICK - Urine  Culture  2. Immunization due Today - Flu vaccine HIGH DOSE PF(Fluzone  Trivalent)  3. Skin rash Ketoconazole  wound culture hold off on antibiotics patient prone to C. difficile - WOUND CULTURE Patient with neuropathy in the feet burning she states it makes it difficult for her to walk Therefore we will use Lyrica  at nighttime We also discussed how Lyrica  can cause side effects hopefully she will tolerate it I would not recommend Cymbalta in the past antidepressants caused side effects Patient will do a follow-up with us  in approximately 3 to 4 weeks to see how this is doing If any titration down the road I would recommend to be done slowly  Also a standard follow-up in 4 months already planned

## 2024-03-04 ENCOUNTER — Telehealth: Payer: Self-pay | Admitting: Pharmacy Technician

## 2024-03-04 ENCOUNTER — Other Ambulatory Visit (HOSPITAL_COMMUNITY): Payer: Self-pay

## 2024-03-04 NOTE — Telephone Encounter (Signed)
 Pharmacy Patient Advocate Encounter  Received notification from [facility] that Prior Authorization for Pregabalin  25MG  capsules has been APPROVED from 03/04/2024 to 03/04/2025.   PA #/Case ID/Reference #: [id number]

## 2024-03-05 ENCOUNTER — Other Ambulatory Visit: Payer: Self-pay

## 2024-03-05 DIAGNOSIS — R339 Retention of urine, unspecified: Secondary | ICD-10-CM

## 2024-03-05 LAB — URINE CULTURE

## 2024-03-05 NOTE — Telephone Encounter (Signed)
 Pharmacist called to verify refill pharmacist was advised due to NP no longer working here the request to have to go to the pt new provider pharmacist voiced her understanding

## 2024-03-07 ENCOUNTER — Other Ambulatory Visit: Payer: Self-pay | Admitting: Family Medicine

## 2024-03-07 LAB — WOUND CULTURE

## 2024-03-07 MED ORDER — MUPIROCIN 2 % EX OINT
TOPICAL_OINTMENT | CUTANEOUS | 1 refills | Status: DC
Start: 2024-03-07 — End: 2024-05-05

## 2024-03-08 ENCOUNTER — Other Ambulatory Visit: Payer: Self-pay | Admitting: Family Medicine

## 2024-03-08 ENCOUNTER — Telehealth: Payer: Self-pay

## 2024-03-08 DIAGNOSIS — R278 Other lack of coordination: Secondary | ICD-10-CM | POA: Diagnosis not present

## 2024-03-08 DIAGNOSIS — M6281 Muscle weakness (generalized): Secondary | ICD-10-CM | POA: Diagnosis not present

## 2024-03-08 MED ORDER — TAMSULOSIN HCL 0.4 MG PO CAPS
0.4000 mg | ORAL_CAPSULE | Freq: Every day | ORAL | 2 refills | Status: DC
Start: 2024-03-08 — End: 2024-05-05

## 2024-03-08 MED ORDER — GABAPENTIN 100 MG PO CAPS
ORAL_CAPSULE | ORAL | 3 refills | Status: DC
Start: 2024-03-08 — End: 2024-04-05

## 2024-03-08 NOTE — Telephone Encounter (Signed)
 Copied from CRM [id number]. Topic: Clinical - Medication Question >> Mar 08, 2024  9:03 AM Leonette SQUIBB wrote: Reason for CRM: Kyra with [facility] called saying pt was put on Lyrica  on the 3rd but it is keeping her up at night.  They are wanting to know if she can try Gabapentin  instead.  CB#  [phone]

## 2024-03-08 NOTE — Telephone Encounter (Signed)
 Order faxed to [phone]

## 2024-03-08 NOTE — Telephone Encounter (Signed)
 I wrote out on a prescription to stop Lyrica  start 7 prescription to Rx here Please fax to [facility]

## 2024-03-09 ENCOUNTER — Ambulatory Visit (HOSPITAL_COMMUNITY)
Admission: RE | Admit: 2024-03-09 | Discharge: 2024-03-09 | Disposition: A | Discharge status: Home / Self Care | Source: Ambulatory Visit | Attending: Gastroenterology

## 2024-03-09 ENCOUNTER — Other Ambulatory Visit: Payer: Self-pay

## 2024-03-09 DIAGNOSIS — K5900 Constipation, unspecified: Secondary | ICD-10-CM | POA: Diagnosis not present

## 2024-03-09 DIAGNOSIS — K551 Chronic vascular disorders of intestine: Secondary | ICD-10-CM | POA: Insufficient documentation

## 2024-03-09 DIAGNOSIS — I7090 Unspecified atherosclerosis: Secondary | ICD-10-CM | POA: Diagnosis not present

## 2024-03-09 DIAGNOSIS — R9389 Abnormal findings on diagnostic imaging of other specified body structures: Secondary | ICD-10-CM | POA: Insufficient documentation

## 2024-03-09 DIAGNOSIS — R197 Diarrhea, unspecified: Secondary | ICD-10-CM | POA: Insufficient documentation

## 2024-03-09 DIAGNOSIS — K8020 Calculus of gallbladder without cholecystitis without obstruction: Secondary | ICD-10-CM | POA: Diagnosis not present

## 2024-03-10 DIAGNOSIS — M6281 Muscle weakness (generalized): Secondary | ICD-10-CM | POA: Diagnosis not present

## 2024-03-10 DIAGNOSIS — R278 Other lack of coordination: Secondary | ICD-10-CM | POA: Diagnosis not present

## 2024-03-15 DIAGNOSIS — R278 Other lack of coordination: Secondary | ICD-10-CM | POA: Diagnosis not present

## 2024-03-15 DIAGNOSIS — M6281 Muscle weakness (generalized): Secondary | ICD-10-CM | POA: Diagnosis not present

## 2024-03-19 DIAGNOSIS — R278 Other lack of coordination: Secondary | ICD-10-CM | POA: Diagnosis not present

## 2024-03-19 DIAGNOSIS — M6281 Muscle weakness (generalized): Secondary | ICD-10-CM | POA: Diagnosis not present

## 2024-03-22 ENCOUNTER — Other Ambulatory Visit: Payer: Self-pay | Admitting: Interventional Radiology

## 2024-03-22 DIAGNOSIS — M6281 Muscle weakness (generalized): Secondary | ICD-10-CM | POA: Diagnosis not present

## 2024-03-22 DIAGNOSIS — R278 Other lack of coordination: Secondary | ICD-10-CM | POA: Diagnosis not present

## 2024-03-22 DIAGNOSIS — K551 Chronic vascular disorders of intestine: Secondary | ICD-10-CM

## 2024-03-22 NOTE — Progress Notes (Signed)
 Report faxed to [facility] - message sent to [facility] with IR schdling, Referral sent, they will contact patient with apt

## 2024-03-22 NOTE — Progress Notes (Signed)
 Hi Tanya ,  Can you please call the patients family and tell them the ultrasound shows the stent placed in the blood vessels of the abdomen is concerning for stenosis/ narrowing . Abdominal xray shows some stool  I recommend she follow-up with interventional radiologist who placed her stent Also she can continue taking Metamucil  =================  Hi Ann ,  Can you please arrange a referral/appointment for this patient to interventional radiologist  Diagnosis: SMA stent stenosis   Thanks,  Heavenly Christine Faizan Syndi Pua, MD Gastroenterology and Hepatology [facility] Gastroenterology  ============  Hi Dr Glendia, RICK .  Looks like she had a follow-up with interventional radiologist and hence I ordered a surveillance duplex

## 2024-03-23 DIAGNOSIS — H353112 Nonexudative age-related macular degeneration, right eye, intermediate dry stage: Secondary | ICD-10-CM | POA: Diagnosis not present

## 2024-03-23 LAB — OPHTHALMOLOGY REPORT-SCANNED

## 2024-03-24 ENCOUNTER — Encounter: Payer: Self-pay | Admitting: Physician Assistant

## 2024-03-24 ENCOUNTER — Ambulatory Visit (INDEPENDENT_AMBULATORY_CARE_PROVIDER_SITE_OTHER): Admitting: Physician Assistant

## 2024-03-24 VITALS — BP 132/82 | Ht 68.0 in | Wt 176.0 lb

## 2024-03-24 DIAGNOSIS — R21 Rash and other nonspecific skin eruption: Secondary | ICD-10-CM

## 2024-03-24 DIAGNOSIS — G4709 Other insomnia: Secondary | ICD-10-CM | POA: Diagnosis not present

## 2024-03-24 DIAGNOSIS — J3489 Other specified disorders of nose and nasal sinuses: Secondary | ICD-10-CM | POA: Diagnosis not present

## 2024-03-24 MED ORDER — MELATONIN 10 MG PO TABS: 10.0000 mg | ORAL_TABLET | Freq: Every day | ORAL | 3 refills | Status: DC

## 2024-03-24 NOTE — Assessment & Plan Note (Signed)
 Persistent nasal lesion for 6-8 months, patient relates to prior oxygen therapy. Differential includes mole vs skin cancer; biopsy needed for definitive diagnosis. - Refer to dermatology for evaluation and potential biopsy of nasal lesion.

## 2024-03-24 NOTE — Assessment & Plan Note (Signed)
 Resolved. Patient denies skin rash or pruritus today. Continue with previously prescribed topical creams as needed.

## 2024-03-24 NOTE — Progress Notes (Signed)
 Established Patient Office Visit  Subjective   Patient ID: [name], female    DOB: 1931-09-26  Age: 89 y.o. MRN: [id number]  Chief Complaint  Patient presents with   Rash    Follow up Gabapentin  is helping neuropathy but disturbing sleep- wants to know if she can take nyquil for sleep- melatonin not helping Continued place on nose for 6 months    Discussed the use of AI scribe software for clinical note transcription with the patient, who gave verbal consent to proceed.  History of Present Illness [name] is a 89 year old female who presents for follow-up of a resolved rash and ongoing sleep disturbances.  Her rash has resolved with the use of prescribed creams. She has a cream available for future use if needed.  She experiences sleep disturbances since starting gabapentin  100 mg at night. She has previously taken melatonin 10 mg at bedtime as needed but has not taken it recently.  A persistent spot on her nose appeared after ICU admission for pneumonia and stent placement six to eight months ago. Various creams, including antibiotic cream, have been ineffective. The spot is not painful but is bothersome.  She is diabetic and managed a recent hyperglycemic episode by walking and consuming candy. She remains active with regular walking. No new shortness of breath or chest pain during activity.   Review of Systems  Constitutional:  Negative for chills, diaphoresis and fever.  Skin:  Negative for itching and rash.  Psychiatric/Behavioral:  The patient has insomnia.       Objective:     BP 132/82   Ht 5' 8 (1.727 m)   Wt 176 lb (79.8 kg)   BMI 26.76 kg/m    Physical Exam Constitutional:      Appearance: Normal appearance. She is normal weight.  HENT:     Head: Normocephalic and atraumatic.     Mouth/Throat:     Mouth: Mucous membranes are moist.     Pharynx: Oropharynx is clear.  Eyes:     Extraocular Movements: Extraocular movements intact.      Conjunctiva/sclera: Conjunctivae normal.  Cardiovascular:     Rate and Rhythm: Normal rate and regular rhythm.     Heart sounds: Normal heart sounds. No murmur heard. Pulmonary:     Effort: Pulmonary effort is normal.     Breath sounds: Normal breath sounds. No wheezing or rales.  Musculoskeletal:     Right lower leg: No edema.     Left lower leg: No edema.  Skin:    General: Skin is warm and dry.  Neurological:     General: No focal deficit present.     Mental Status: She is alert and oriented to person, place, and time.  Psychiatric:        Mood and Affect: Mood normal.        Behavior: Behavior normal.     No results found for any visits on 03/24/24.  The ASCVD Risk score (Arnett DK, et al., 2019) failed to calculate for the following reasons:   The 2019 ASCVD risk score is only valid for ages 48 to 49    Assessment & Plan:   No follow-ups on file.   Other insomnia Assessment & Plan: Difficulty sleeping, possibly related to gabapentin  use. Melatonin 10 mg available for bedtime use. - Recommend melatonin 10 mg at bedtime as needed for sleep. - Start gabapentin  in the morning instead of at bed time. - Follow up if insomnia  persists with these changes.   Orders: -     Melatonin; Take 10 mg by mouth at bedtime.  Dispense: 30 tablet; Refill: 3  Skin rash Assessment & Plan: Resolved. Patient denies skin rash or pruritus today. Continue with previously prescribed topical creams as needed.    Lesion of nose Assessment & Plan: Persistent nasal lesion for 6-8 months, patient relates to prior oxygen therapy. Differential includes mole vs skin cancer; biopsy needed for definitive diagnosis. - Refer to dermatology for evaluation and potential biopsy of nasal lesion.   Orders: -     Ambulatory referral to Dermatology    [place] Kinaya Hilliker, PA-C

## 2024-03-24 NOTE — Assessment & Plan Note (Signed)
 Difficulty sleeping, possibly related to gabapentin  use. Melatonin 10 mg available for bedtime use. - Recommend melatonin 10 mg at bedtime as needed for sleep. - Start gabapentin  in the morning instead of at bed time. - Follow up if insomnia persists with these changes.

## 2024-03-25 DIAGNOSIS — F5101 Primary insomnia: Secondary | ICD-10-CM | POA: Diagnosis not present

## 2024-03-25 DIAGNOSIS — F331 Major depressive disorder, recurrent, moderate: Secondary | ICD-10-CM | POA: Diagnosis not present

## 2024-03-25 DIAGNOSIS — F419 Anxiety disorder, unspecified: Secondary | ICD-10-CM | POA: Diagnosis not present

## 2024-03-26 ENCOUNTER — Encounter (HOSPITAL_COMMUNITY): Payer: Self-pay | Admitting: Emergency Medicine

## 2024-03-26 ENCOUNTER — Inpatient Hospital Stay (HOSPITAL_COMMUNITY)
Admission: EM | Admit: 2024-03-26 | Discharge: 2024-04-02 | DRG: 392 | Disposition: A | Discharge status: Home Health | Source: Skilled Nursing Facility | Attending: Family Medicine | Admitting: Family Medicine

## 2024-03-26 ENCOUNTER — Other Ambulatory Visit: Payer: Self-pay

## 2024-03-26 ENCOUNTER — Emergency Department (HOSPITAL_COMMUNITY)

## 2024-03-26 DIAGNOSIS — K55059 Acute (reversible) ischemia of intestine, part and extent unspecified: Secondary | ICD-10-CM | POA: Diagnosis not present

## 2024-03-26 DIAGNOSIS — I708 Atherosclerosis of other arteries: Secondary | ICD-10-CM | POA: Diagnosis not present

## 2024-03-26 DIAGNOSIS — G8929 Other chronic pain: Secondary | ICD-10-CM | POA: Diagnosis present

## 2024-03-26 DIAGNOSIS — B9620 Unspecified Escherichia coli [E. coli] as the cause of diseases classified elsewhere: Secondary | ICD-10-CM | POA: Diagnosis present

## 2024-03-26 DIAGNOSIS — R21 Rash and other nonspecific skin eruption: Secondary | ICD-10-CM | POA: Diagnosis present

## 2024-03-26 DIAGNOSIS — K2970 Gastritis, unspecified, without bleeding: Principal | ICD-10-CM | POA: Diagnosis present

## 2024-03-26 DIAGNOSIS — Z515 Encounter for palliative care: Secondary | ICD-10-CM

## 2024-03-26 DIAGNOSIS — F411 Generalized anxiety disorder: Secondary | ICD-10-CM | POA: Diagnosis not present

## 2024-03-26 DIAGNOSIS — Z96651 Presence of right artificial knee joint: Secondary | ICD-10-CM | POA: Diagnosis not present

## 2024-03-26 DIAGNOSIS — K5732 Diverticulitis of large intestine without perforation or abscess without bleeding: Secondary | ICD-10-CM | POA: Diagnosis present

## 2024-03-26 DIAGNOSIS — E1165 Type 2 diabetes mellitus with hyperglycemia: Secondary | ICD-10-CM | POA: Diagnosis present

## 2024-03-26 DIAGNOSIS — H04123 Dry eye syndrome of bilateral lacrimal glands: Secondary | ICD-10-CM | POA: Diagnosis present

## 2024-03-26 DIAGNOSIS — Z833 Family history of diabetes mellitus: Secondary | ICD-10-CM | POA: Diagnosis not present

## 2024-03-26 DIAGNOSIS — R1013 Epigastric pain: Secondary | ICD-10-CM | POA: Diagnosis not present

## 2024-03-26 DIAGNOSIS — E1142 Type 2 diabetes mellitus with diabetic polyneuropathy: Secondary | ICD-10-CM | POA: Diagnosis present

## 2024-03-26 DIAGNOSIS — I700 Atherosclerosis of aorta: Secondary | ICD-10-CM | POA: Diagnosis present

## 2024-03-26 DIAGNOSIS — Z7982 Long term (current) use of aspirin: Secondary | ICD-10-CM | POA: Diagnosis not present

## 2024-03-26 DIAGNOSIS — Z823 Family history of stroke: Secondary | ICD-10-CM

## 2024-03-26 DIAGNOSIS — Q272 Other congenital malformations of renal artery: Secondary | ICD-10-CM | POA: Diagnosis not present

## 2024-03-26 DIAGNOSIS — E1151 Type 2 diabetes mellitus with diabetic peripheral angiopathy without gangrene: Secondary | ICD-10-CM | POA: Diagnosis present

## 2024-03-26 DIAGNOSIS — Z66 Do not resuscitate: Secondary | ICD-10-CM | POA: Diagnosis present

## 2024-03-26 DIAGNOSIS — N390 Urinary tract infection, site not specified: Principal | ICD-10-CM | POA: Diagnosis present

## 2024-03-26 DIAGNOSIS — Z7902 Long term (current) use of antithrombotics/antiplatelets: Secondary | ICD-10-CM

## 2024-03-26 DIAGNOSIS — E7849 Other hyperlipidemia: Secondary | ICD-10-CM | POA: Diagnosis present

## 2024-03-26 DIAGNOSIS — Z95828 Presence of other vascular implants and grafts: Secondary | ICD-10-CM

## 2024-03-26 DIAGNOSIS — Z888 Allergy status to other drugs, medicaments and biological substances status: Secondary | ICD-10-CM

## 2024-03-26 DIAGNOSIS — E1169 Type 2 diabetes mellitus with other specified complication: Secondary | ICD-10-CM | POA: Diagnosis present

## 2024-03-26 DIAGNOSIS — R112 Nausea with vomiting, unspecified: Secondary | ICD-10-CM | POA: Diagnosis present

## 2024-03-26 DIAGNOSIS — R531 Weakness: Secondary | ICD-10-CM | POA: Diagnosis not present

## 2024-03-26 DIAGNOSIS — I701 Atherosclerosis of renal artery: Secondary | ICD-10-CM | POA: Diagnosis not present

## 2024-03-26 DIAGNOSIS — I10 Essential (primary) hypertension: Secondary | ICD-10-CM | POA: Diagnosis present

## 2024-03-26 DIAGNOSIS — D649 Anemia, unspecified: Secondary | ICD-10-CM | POA: Diagnosis not present

## 2024-03-26 DIAGNOSIS — Z8249 Family history of ischemic heart disease and other diseases of the circulatory system: Secondary | ICD-10-CM

## 2024-03-26 DIAGNOSIS — K219 Gastro-esophageal reflux disease without esophagitis: Secondary | ICD-10-CM | POA: Diagnosis present

## 2024-03-26 DIAGNOSIS — R339 Retention of urine, unspecified: Secondary | ICD-10-CM | POA: Diagnosis present

## 2024-03-26 DIAGNOSIS — K5730 Diverticulosis of large intestine without perforation or abscess without bleeding: Secondary | ICD-10-CM | POA: Diagnosis not present

## 2024-03-26 DIAGNOSIS — Z90710 Acquired absence of both cervix and uterus: Secondary | ICD-10-CM

## 2024-03-26 DIAGNOSIS — Z743 Need for continuous supervision: Secondary | ICD-10-CM | POA: Diagnosis not present

## 2024-03-26 DIAGNOSIS — I724 Aneurysm of artery of lower extremity: Secondary | ICD-10-CM | POA: Diagnosis not present

## 2024-03-26 DIAGNOSIS — Z79899 Other long term (current) drug therapy: Secondary | ICD-10-CM

## 2024-03-26 DIAGNOSIS — R1084 Generalized abdominal pain: Secondary | ICD-10-CM | POA: Diagnosis not present

## 2024-03-26 DIAGNOSIS — K8020 Calculus of gallbladder without cholecystitis without obstruction: Secondary | ICD-10-CM | POA: Diagnosis not present

## 2024-03-26 DIAGNOSIS — K439 Ventral hernia without obstruction or gangrene: Secondary | ICD-10-CM | POA: Diagnosis not present

## 2024-03-26 DIAGNOSIS — Z7984 Long term (current) use of oral hypoglycemic drugs: Secondary | ICD-10-CM | POA: Diagnosis not present

## 2024-03-26 DIAGNOSIS — Z7189 Other specified counseling: Secondary | ICD-10-CM

## 2024-03-26 DIAGNOSIS — K551 Chronic vascular disorders of intestine: Principal | ICD-10-CM | POA: Diagnosis present

## 2024-03-26 DIAGNOSIS — R932 Abnormal findings on diagnostic imaging of liver and biliary tract: Secondary | ICD-10-CM | POA: Diagnosis not present

## 2024-03-26 LAB — CBC WITH DIFFERENTIAL/PLATELET
Abs Immature Granulocytes: 0.03 K/uL (ref 0.00–0.07)
Basophils Absolute: 0 K/uL (ref 0.0–0.1)
Basophils Relative: 1 %
Eosinophils Absolute: 0.1 K/uL (ref 0.0–0.5)
Eosinophils Relative: 2 %
HCT: 34 % — ABNORMAL LOW (ref 36.0–46.0)
Hemoglobin: 10.6 g/dL — ABNORMAL LOW (ref 12.0–15.0)
Immature Granulocytes: 0 %
Lymphocytes Relative: 26 %
Lymphs Abs: 1.8 K/uL (ref 0.7–4.0)
MCH: 27.3 pg (ref 26.0–34.0)
MCHC: 31.2 g/dL (ref 30.0–36.0)
MCV: 87.6 fL (ref 80.0–100.0)
Monocytes Absolute: 0.7 K/uL (ref 0.1–1.0)
Monocytes Relative: 10 %
Neutro Abs: 4.4 K/uL (ref 1.7–7.7)
Neutrophils Relative %: 61 %
Platelets: 251 K/uL (ref 150–400)
RBC: 3.88 MIL/uL (ref 3.87–5.11)
RDW: 14.1 % (ref 11.5–15.5)
WBC: 7.1 K/uL (ref 4.0–10.5)
nRBC: 0 % (ref 0.0–0.2)

## 2024-03-26 LAB — COMPREHENSIVE METABOLIC PANEL WITH GFR
ALT: 15 U/L (ref 0–44)
AST: 18 U/L (ref 15–41)
Albumin: 4 g/dL (ref 3.5–5.0)
Alkaline Phosphatase: 93 U/L (ref 38–126)
Anion gap: 15 (ref 5–15)
BUN: 15 mg/dL (ref 8–23)
CO2: 24 mmol/L (ref 22–32)
Calcium: 9.9 mg/dL (ref 8.9–10.3)
Chloride: 97 mmol/L — ABNORMAL LOW (ref 98–111)
Creatinine, Ser: 0.78 mg/dL (ref 0.44–1.00)
GFR, Estimated: >60 mL/min (ref >60)
Glucose, Bld: 128 mg/dL — ABNORMAL HIGH (ref 70–99)
Potassium: 3.9 mmol/L (ref 3.5–5.1)
Sodium: 136 mmol/L (ref 135–145)
Total Bilirubin: 1 mg/dL (ref 0.0–1.2)
Total Protein: 7.2 g/dL (ref 6.5–8.1)

## 2024-03-26 LAB — URINALYSIS, ROUTINE W REFLEX MICROSCOPIC
Bilirubin Urine: NEGATIVE
Glucose, UA: NEGATIVE mg/dL
Hgb urine dipstick: NEGATIVE
Ketones, ur: NEGATIVE mg/dL
Nitrite: NEGATIVE
Protein, ur: 30 mg/dL — AB
Specific Gravity, Urine: 1.005 (ref 1.005–1.030)
WBC, UA: >50 WBC/hpf (ref 0–5)
pH: 7 (ref 5.0–8.0)

## 2024-03-26 LAB — LIPASE, BLOOD: Lipase: 25 U/L (ref 11–51)

## 2024-03-26 MED ORDER — POLYETHYLENE GLYCOL 3350 17 G PO PACK
17.0000 g | PACK | Freq: Every day | ORAL | Status: DC | PRN
  Filled 2024-03-26: qty 1

## 2024-03-26 MED ORDER — PROSIGHT PO TABS
1.0000 | ORAL_TABLET | Freq: Every day | ORAL | Status: DC
  Administered 2024-03-27 – 2024-04-02 (×6): 1 via ORAL
  Filled 2024-03-26 (×7): qty 1

## 2024-03-26 MED ORDER — HYDRALAZINE HCL 20 MG/ML IJ SOLN
5.0000 mg | INTRAMUSCULAR | Status: DC | PRN
  Administered 2024-03-30: 5 mg via INTRAVENOUS
  Filled 2024-03-26: qty 1

## 2024-03-26 MED ORDER — GLUCERNA SHAKE PO LIQD
237.0000 mL | Freq: Three times a day (TID) | ORAL | Status: DC
Start: 2024-03-26 — End: 2024-04-02
  Administered 2024-03-26 – 2024-04-02 (×17): 237 mL via ORAL
  Filled 2024-03-26 (×3): qty 237

## 2024-03-26 MED ORDER — ACETAMINOPHEN 325 MG PO TABS
650.0000 mg | ORAL_TABLET | Freq: Four times a day (QID) | ORAL | Status: DC | PRN
  Administered 2024-03-27: 650 mg via ORAL
  Filled 2024-03-26: qty 2

## 2024-03-26 MED ORDER — LACTATED RINGERS IV SOLN: INTRAVENOUS | Status: AC

## 2024-03-26 MED ORDER — INSULIN ASPART 100 UNIT/ML IJ SOLN
0.0000 [IU] | Freq: Three times a day (TID) | INTRAMUSCULAR | Status: DC
  Administered 2024-03-27 (×2): 1 [IU] via SUBCUTANEOUS
  Administered 2024-03-28 – 2024-03-29 (×3): 2 [IU] via SUBCUTANEOUS
  Administered 2024-03-29: 1 [IU] via SUBCUTANEOUS
  Administered 2024-03-30: 2 [IU] via SUBCUTANEOUS
  Administered 2024-03-31: 1 [IU] via SUBCUTANEOUS
  Administered 2024-03-31 – 2024-04-01 (×2): 2 [IU] via SUBCUTANEOUS
  Administered 2024-04-01: 3 [IU] via SUBCUTANEOUS
  Administered 2024-04-02 (×2): 2 [IU] via SUBCUTANEOUS

## 2024-03-26 MED ORDER — ONDANSETRON HCL 4 MG/2ML IJ SOLN: 4.0000 mg | Freq: Four times a day (QID) | INTRAMUSCULAR | Status: DC | PRN

## 2024-03-26 MED ORDER — MORPHINE SULFATE (PF) 2 MG/ML IV SOLN
2.0000 mg | Freq: Once | INTRAVENOUS | Status: AC
  Administered 2024-03-26: 2 mg via INTRAVENOUS
  Filled 2024-03-26: qty 1

## 2024-03-26 MED ORDER — ALPRAZOLAM 0.25 MG PO TABS
0.2500 mg | ORAL_TABLET | Freq: Every evening | ORAL | Status: DC | PRN
  Administered 2024-03-26 – 2024-04-01 (×6): 0.25 mg via ORAL
  Filled 2024-03-26 (×6): qty 1

## 2024-03-26 MED ORDER — DOCUSATE SODIUM 100 MG PO CAPS
100.0000 mg | ORAL_CAPSULE | Freq: Two times a day (BID) | ORAL | Status: DC
  Administered 2024-03-26 – 2024-04-02 (×13): 100 mg via ORAL
  Filled 2024-03-26 (×13): qty 1

## 2024-03-26 MED ORDER — GABAPENTIN 100 MG PO CAPS
100.0000 mg | ORAL_CAPSULE | Freq: Every morning | ORAL | Status: DC
  Administered 2024-03-27 – 2024-04-02 (×7): 100 mg via ORAL
  Filled 2024-03-26 (×7): qty 1

## 2024-03-26 MED ORDER — ESTRADIOL 0.1 MG/GM VA CREA
1.0000 | TOPICAL_CREAM | Freq: Every day | VAGINAL | Status: DC
  Administered 2024-03-26 – 2024-03-30 (×4): 1 via VAGINAL
  Filled 2024-03-26 (×2): qty 42.5

## 2024-03-26 MED ORDER — CLOPIDOGREL BISULFATE 75 MG PO TABS
75.0000 mg | ORAL_TABLET | Freq: Every day | ORAL | Status: DC
  Administered 2024-03-26 – 2024-04-02 (×7): 75 mg via ORAL
  Filled 2024-03-26 (×7): qty 1

## 2024-03-26 MED ORDER — PANTOPRAZOLE SODIUM 40 MG PO TBEC: 40.0000 mg | DELAYED_RELEASE_TABLET | Freq: Every day | ORAL | Status: DC

## 2024-03-26 MED ORDER — HYDRALAZINE HCL 25 MG PO TABS
25.0000 mg | ORAL_TABLET | Freq: Three times a day (TID) | ORAL | Status: DC
  Administered 2024-03-26 – 2024-04-02 (×20): 25 mg via ORAL
  Filled 2024-03-26 (×20): qty 1

## 2024-03-26 MED ORDER — ONDANSETRON HCL 4 MG/2ML IJ SOLN
4.0000 mg | Freq: Once | INTRAMUSCULAR | Status: AC
  Administered 2024-03-26: 4 mg via INTRAVENOUS
  Filled 2024-03-26: qty 2

## 2024-03-26 MED ORDER — IOHEXOL 350 MG/ML SOLN
80.0000 mL | Freq: Once | INTRAVENOUS | Status: AC | PRN
  Administered 2024-03-26: 80 mL via INTRAVENOUS

## 2024-03-26 MED ORDER — MELATONIN 3 MG PO TABS
9.0000 mg | ORAL_TABLET | Freq: Every day | ORAL | Status: DC
  Administered 2024-03-26 – 2024-04-01 (×7): 9 mg via ORAL
  Filled 2024-03-26 (×7): qty 3

## 2024-03-26 MED ORDER — TAMSULOSIN HCL 0.4 MG PO CAPS
0.4000 mg | ORAL_CAPSULE | Freq: Every day | ORAL | Status: DC
  Administered 2024-03-27 – 2024-04-02 (×6): 0.4 mg via ORAL
  Filled 2024-03-26 (×6): qty 1

## 2024-03-26 MED ORDER — ASPIRIN 81 MG PO CHEW
81.0000 mg | CHEWABLE_TABLET | Freq: Every day | ORAL | Status: DC
  Administered 2024-03-27 – 2024-04-02 (×6): 81 mg via ORAL
  Filled 2024-03-26 (×6): qty 1

## 2024-03-26 MED ORDER — POLYVINYL ALCOHOL 1.4 % OP SOLN
1.0000 [drp] | Freq: Two times a day (BID) | OPHTHALMIC | Status: DC
  Administered 2024-03-26 – 2024-04-02 (×14): 1 [drp] via OPHTHALMIC
  Filled 2024-03-26: qty 15

## 2024-03-26 MED ORDER — ACETAMINOPHEN 650 MG RE SUPP: 650.0000 mg | Freq: Four times a day (QID) | RECTAL | Status: DC | PRN

## 2024-03-26 MED ORDER — ALBUTEROL SULFATE (2.5 MG/3ML) 0.083% IN NEBU: 2.5000 mg | INHALATION_SOLUTION | Freq: Four times a day (QID) | RESPIRATORY_TRACT | Status: DC | PRN

## 2024-03-26 MED ORDER — BISACODYL 10 MG RE SUPP
10.0000 mg | Freq: Every day | RECTAL | Status: DC | PRN
  Administered 2024-03-31: 10 mg via RECTAL
  Filled 2024-03-26: qty 1

## 2024-03-26 MED ORDER — ONDANSETRON HCL 4 MG PO TABS: 4.0000 mg | ORAL_TABLET | Freq: Four times a day (QID) | ORAL | Status: DC | PRN

## 2024-03-26 MED ORDER — PANTOPRAZOLE SODIUM 40 MG PO TBEC
40.0000 mg | DELAYED_RELEASE_TABLET | Freq: Two times a day (BID) | ORAL | Status: DC
  Administered 2024-03-27 – 2024-04-02 (×12): 40 mg via ORAL
  Filled 2024-03-26 (×12): qty 1

## 2024-03-26 MED ORDER — VITAMIN B-12 1000 MCG PO TABS
1000.0000 ug | ORAL_TABLET | Freq: Every day | ORAL | Status: DC
  Administered 2024-03-27 – 2024-04-01 (×7): 1000 ug via ORAL
  Filled 2024-03-26 (×6): qty 1

## 2024-03-26 MED ORDER — CYCLOSPORINE 0.05 % OP EMUL
1.0000 [drp] | Freq: Two times a day (BID) | OPHTHALMIC | Status: DC
  Administered 2024-03-26 – 2024-04-02 (×14): 1 [drp] via OPHTHALMIC
  Filled 2024-03-26 (×15): qty 30

## 2024-03-26 MED ORDER — METHENAMINE MANDELATE 0.5 G PO TABS
1.0000 g | ORAL_TABLET | Freq: Two times a day (BID) | ORAL | Status: DC
  Administered 2024-03-27 – 2024-04-02 (×12): 1 g via ORAL
  Filled 2024-03-26 (×16): qty 2

## 2024-03-26 MED ORDER — AMLODIPINE BESYLATE 10 MG PO TABS
10.0000 mg | ORAL_TABLET | Freq: Every day | ORAL | Status: DC
  Administered 2024-03-27 – 2024-03-29 (×3): 10 mg via ORAL
  Filled 2024-03-26 (×3): qty 1

## 2024-03-26 MED ORDER — MORPHINE SULFATE (PF) 2 MG/ML IV SOLN
2.0000 mg | INTRAVENOUS | Status: DC | PRN
  Administered 2024-03-28 – 2024-04-01 (×12): 2 mg via INTRAVENOUS
  Filled 2024-03-26 (×13): qty 1

## 2024-03-26 MED ORDER — ENOXAPARIN SODIUM 40 MG/0.4ML IJ SOSY
40.0000 mg | PREFILLED_SYRINGE | INTRAMUSCULAR | Status: DC
  Administered 2024-03-26 – 2024-04-01 (×7): 40 mg via SUBCUTANEOUS
  Filled 2024-03-26 (×7): qty 0.4

## 2024-03-26 NOTE — ED Notes (Signed)
 Pt care taken, Hospitalist is in to talk to her about admission.

## 2024-03-26 NOTE — ED Triage Notes (Addendum)
 Pt in by [facility] for epigastric pain that radiates to lower abd x3-4 days and it has progressively gotten worse. Also c/o nausea.  Pt states she recently had a ultrasound done which said her stent in her abdomen was narrowing.

## 2024-03-26 NOTE — ED Notes (Signed)
 Patient transported to CT

## 2024-03-26 NOTE — ED Notes (Signed)
[facility] CALLED FOR TRANSPORT °

## 2024-03-26 NOTE — ED Notes (Signed)
 Pt sats dropped to 88% on RA while sleeping after being given morphine . Pt placed on 2L o2.

## 2024-03-26 NOTE — H&P (Signed)
 [facility] H&P   Patient Demographics:    [name], is a 89 y.o. female  MRN: [id number]   DOB - August 14, 1931  Admit Date - 03/26/2024  Outpatient Primary MD for the patient is [name], MD  Referring MD/NP/PA: PA Tammy  Outpatient Specialists: GI in [place] Dr. Cinderella  Patient coming from: ALF  Chief Complaint  Patient presents with   Abdominal Pain      HPI:    [name]  is a 89 y.o. female, with medical history significant of hypertension, hyperlipidemia, T2DM, GERD mesenteric artery stenosis and stent placement, history of urinary retention with recurrent UTIs, she lives at ALF . - Patient comes to ED due to complaints of abdominal pain, nausea and poor appetite, patient reports abdominal pain, nausea over the last 4 days, mainly postprandial, 35 to 40 minutes after eating, denies diarrhea, but reports nausea, reports decreased oral intake, no coffee-ground emesis, no melena, no bright red blood per rectum, no fever, no chills, reports history of simples previous presentation prior to her SMA stent placement. 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 challenge, due to significant pain, required multiple morphine  dosing, for pain control, so [facility] hospitalist consulted to admit     Review of systems:     A full 10 point Review of Systems was done, except as stated above, all other Review of Systems were negative.   With Past History of the following :    Past Medical History:  Diagnosis Date   Blood transfusion without reported diagnosis    Cognitive dysfunction 03/04/2019   Patient scores 21 out of 30 on [place] cognitive assessment September 2020   Diabetes  mellitus without complication (HCC)    Diabetic peripheral neuropathy associated with type 2 diabetes mellitus (HCC) 02/25/2022   Diverticulitis    Frailty 03/04/2019   H/O bilateral breast reduction surgery    Hyperlipidemia    a. intolerant to statins.    Hypertension       Past Surgical History:  Procedure Laterality Date   ABDOMINAL HYSTERECTOMY  2003   APPENDECTOMY     age 38   BIOPSY  04/10/2023   Procedure: BIOPSY;  Surgeon: Cinderella Deatrice FALCON, MD;  Location: [facility];  Service: Endoscopy;;   BREAST REDUCTION SURGERY     age 82   COLON SURGERY Left 2011   Hemicolectomy due to diverticulitis   ESOPHAGOGASTRODUODENOSCOPY (EGD) WITH PROPOFOL  N/A 04/10/2023   Procedure: ESOPHAGOGASTRODUODENOSCOPY (EGD) WITH PROPOFOL ;  Surgeon: Cinderella Deatrice FALCON, MD;  Location: [facility];  Service: Endoscopy;  Laterality: N/A;   EYE SURGERY  03/30/2009   cataract   IR ANGIOGRAM VISCERAL SELECTIVE  04/11/2023  IR INTRAVASCULAR ULTRASOUND NON CORONARY  04/11/2023   IR TRANSCATH PLC STENT 1ST ART NOT LE CV CAR VERT CAR  04/11/2023   IR US  GUIDE VASC ACCESS RIGHT  04/11/2023   KNEE SURGERY Right 2003   knee cap   LAPAROSCOPIC INCISIONAL / UMBILICAL / VENTRAL HERNIA REPAIR  02/23/2007   REDUCTION MAMMAPLASTY Bilateral 2001   REFRACTIVE SURGERY     TOTAL KNEE ARTHROPLASTY Right 04/10/2022   Procedure: TOTAL KNEE ARTHROPLASTY;  Surgeon: Gerome Charleston, MD;  Location: [facility];  Service: Orthopedics;  Laterality: Right;  adductor canal      Social History:     Social History   Tobacco Use   Smoking status: Never   Smokeless tobacco: Never  Substance Use Topics   Alcohol  use: No        Family History :     Family History  Problem Relation Age of Onset   Cancer Mother        ovaian   Hypertension Father        kidney   Stroke Father    Cancer Father    Hypertension Sister    Diabetes Brother      Home Medications:   Prior to Admission medications   Medication Sig  Start Date End Date Taking? Authorizing Provider  acetaminophen  (TYLENOL ) 325 MG tablet TAKE (2) TABLETS BY MOUTH EVERY SIX HOURS AS NEEDED FOR PAIN. MAX 3GM IN 24 HOURS. 07/28/23  Yes [name], MD  albuterol  (VENTOLIN  HFA) 108 (90 Base) MCG/ACT inhaler INHALE 2 PUFFS INTO THE LUNGS EVERY SIX HOURS AS NEEDED FOR WHEEZING OR SHORTNESS OF BREATH. 09/26/23  Yes Luking, Scott A, MD  ALPRAZolam  (XANAX ) 0.25 MG tablet TAKE (1) TABLET BY MOUTH AT BEDTIME FOR SLEEP. 02/25/24  Yes Luking, Glendia LABOR, MD  amLODipine  (NORVASC ) 10 MG tablet TAKE (1) TABLET BY MOUTH ONCE DAILY. 10/07/23  Yes Luking, Glendia LABOR, MD  aspirin  (ASPIRIN  LOW DOSE) 81 MG chewable tablet CHEW (1) TABLET BY MOUTH ONCE DAILY. 09/06/23  Yes [name], MD  BETA CAROTENE PROVITAMIN A 25000 units capsule TAKE (1) CAPSULE BY MOUTH AT BEDTIME. 02/24/24  Yes Luking, Scott A, MD  BISACODYL  5 MG EC tablet TAKE 1 TABLET BY MOUTH ONCE DAILY AS NEEDED FOR MODERATE CONSTIPATION. 12/20/22  Yes Luking, Glendia LABOR, MD  cyanocobalamin  1000 MCG tablet Take 1,000 mcg by mouth at bedtime.   Yes [provider]  cycloSPORINE  (RESTASIS ) 0.05 % ophthalmic emulsion Place 1 drop into both eyes 2 (two) times daily.   Yes [provider]  estradiol  (ESTRACE ) 0.1 MG/GM vaginal cream Discard plastic applicator. Insert a blueberry size amount (approximately 1 gram) of cream on fingertip inside vagina at bedtime every night for 1 week then every other night. For long term use. 01/15/24  Yes Larocco, Lauraine BROCKS, FNP  gabapentin  (NEURONTIN ) 100 MG capsule 1 nightly for neuropathy Patient taking differently: Take 100 mg by mouth in the morning. 03/08/24  Yes Luking, Glendia LABOR, MD  hydrALAZINE  (APRESOLINE ) 25 MG tablet Take 1 tablet (25 mg total) by mouth 3 (three) times daily. 12/31/23  Yes Luking, Glendia LABOR, MD  IVIZIA DRY EYES 0.5 % SOLN Place 1 drop into both eyes 2 (two) times daily. 09/22/23  Yes [provider]  Melatonin 10 MG TABS Take 10 mg by mouth at  bedtime. 03/24/24  Yes Grooms, Courtney, PA-C  METAMUCIL 4 IN 1 FIBER 25 % PACK Take by mouth 2 (two) times daily. 03/25/24  Yes  [provider]  metFORMIN  (GLUCOPHAGE ) 500 MG tablet TAKE (1) TABLET BY MOUTH IN THE MORNING & (1/2) TABLET (250MG ) BY MOUTH AT SUPPER 10/07/23  Yes Luking, Glendia LABOR, MD  methenamine  (HIPREX ) 1 g tablet Take 1 tablet (1 g total) by mouth 2 (two) times daily with a meal. Most effective when taken with a daily Vitamin C supplement. 01/15/24  Yes Gerldine Lauraine BROCKS, FNP  Multiple Vitamins-Minerals (PRESERVISION AREDS 2) CAPS Take 1 capsule by mouth in the morning and at bedtime.   Yes [provider]  mupirocin  ointment (BACTROBAN ) 2 % Apply thin amount twice daily to rash in groin creases twice daily for 5 days at a time when rash occurs 03/07/24  Yes Luking, Glendia LABOR, MD  ondansetron  (ZOFRAN -ODT) 4 MG disintegrating tablet 4mg  ODT q4 hours prn nausea/vomit 09/05/23  Yes Zammit, Joseph, MD  pantoprazole  (PROTONIX ) 40 MG tablet Take 1 tablet (40 mg total) by mouth 2 (two) times daily before a meal. 12/24/23  Yes Cook, Jayce G, DO  polyethylene glycol powder (GOODSENSE CLEARLAX) 17 GM/SCOOP powder 1 scoop in 8 ounces water  once daily as needed for constipation 06/03/23  Yes Luking, Scott A, MD  sodium chloride  1 g tablet TAKE (1) TABLET BY MOUTH TWICE DAILY WITH A MEAL. 01/26/24  Yes Luking, Scott A, MD  tamsulosin  (FLOMAX ) 0.4 MG CAPS capsule Take 1 capsule (0.4 mg total) by mouth daily. 03/08/24  Yes Dahlstedt, Garnette, MD  valsartan  (DIOVAN ) 320 MG tablet Take 1 tablet (320 mg total) by mouth daily. 12/22/23  Yes Luking, Glendia LABOR, MD  Vitamin D , Cholecalciferol , 10 MCG (400 UNIT) TABS TAKE (1) TABLET BY MOUTH ONCE DAILY. 10/07/23  Yes Luking, Scott A, MD  glucose blood (EASYMAX TEST) test strip CHECK BLOOD SUGAR ONCE DAILY.(CALL MD IF BS BELOW 60: OR IF BS ABOVE 400) 10/07/23   Luking, Glendia LABOR, MD  ketoconazole  (NIZORAL ) 2 % cream Apply 1 Application topically 2 (two) times daily.  For 10 days to groin rash Patient not taking: Reported on 03/26/2024 03/03/24   [name], MD  Safety Lancets 28G MISC CHECK BLOOD SUGAR ONCE DAILY. 01/21/24   [name], MD     Allergies:     Allergies  Allergen Reactions   Statins Other (See Comments)    Severe myalgias   Phenergan [Promethazine Hcl] Other (See Comments)    Hallucinations    Hydrocortisone Itching   Lisinopril Other (See Comments)    Lethargy, Fatigue   Prednisone  Rash   Trazodone  And Nefazodone Cough     Physical Exam:   Vitals  Blood pressure (!) 176/55, pulse 79, temperature 97.6 F (36.4 C), temperature source Oral, resp. rate 18, height 5' 8 (1.727 m), weight 79 kg, SpO2 96%.   1. General Elderly female, appears to be in good health, well-developed  2. Normal affect and insight, Not Suicidal or Homicidal, Awake Alert, Oriented X 3.  3. No F.N deficits, ALL C.Nerves Intact, Strength 5/5 all 4 extremities, Sensation intact all 4 extremities, Plantars down going.  4. Ears and Eyes appear Normal, Conjunctivae clear, PERRLA. Moist Oral Mucosa.  5. Supple Neck, No JVD, No cervical lymphadenopathy appriciated, No Carotid Bruits.  6. Symmetrical Chest wall movement, Good air movement bilaterally, CTAB.  7. RRR, No Gallops, Rubs or Murmurs, No Parasternal Heave.  8. Positive Bowel Sounds, Abdomen Soft, mild diffuse tenderness, No organomegaly appriciated,No rebound -guarding or rigidity.  9.  No Cyanosis, Normal Skin Turgor, No Skin Rash or Bruise.  10. Good muscle tone,  joints appear normal , no effusions, Normal ROM.     Data Review:    CBC Recent Labs  Lab 03/26/24 1326  WBC 7.1  HGB 10.6*  HCT 34.0*  PLT 251  MCV 87.6  MCH 27.3  MCHC 31.2  RDW 14.1  LYMPHSABS 1.8  MONOABS 0.7  EOSABS 0.1  BASOSABS 0.0   ------------------------------------------------------------------------------------------------------------------  Chemistries  Recent Labs  Lab 03/26/24 1326   NA 136  K 3.9  CL 97*  CO2 24  GLUCOSE 128*  BUN 15  CREATININE 0.78  CALCIUM 9.9  AST 18  ALT 15  ALKPHOS 93  BILITOT 1.0   ------------------------------------------------------------------------------------------------------------------ estimated creatinine clearance is 51.6 mL/min (by C-G formula based on SCr of 0.78 mg/dL). ------------------------------------------------------------------------------------------------------------------ No results for input(s): TSH, T4TOTAL, T3FREE, THYROIDAB in the last 72 hours.  Invalid input(s): FREET3  Coagulation profile No results for input(s): INR, PROTIME in the last 168 hours. ------------------------------------------------------------------------------------------------------------------- No results for input(s): DDIMER in the last 72 hours. -------------------------------------------------------------------------------------------------------------------  Cardiac Enzymes No results for input(s): CKMB, TROPONINI, MYOGLOBIN in the last 168 hours.  Invalid input(s): CK ------------------------------------------------------------------------------------------------------------------    Component Value Date/Time   BNP 88.0 09/22/2023 1848     ---------------------------------------------------------------------------------------------------------------  Urinalysis    Component Value Date/Time   COLORURINE YELLOW 03/26/2024 1410   APPEARANCEUR TURBID (A) 03/26/2024 1410   APPEARANCEUR Clear 01/15/2024 1310   LABSPEC 1.005 03/26/2024 1410   PHURINE 7.0 03/26/2024 1410   GLUCOSEU NEGATIVE 03/26/2024 1410   HGBUR NEGATIVE 03/26/2024 1410   BILIRUBINUR NEGATIVE 03/26/2024 1410   BILIRUBINUR negative 03/03/2024 0917   BILIRUBINUR Negative 01/15/2024 1310   KETONESUR NEGATIVE 03/26/2024 1410   PROTEINUR 30 (A) 03/26/2024 1410   UROBILINOGEN 0.2 03/03/2024 0917   UROBILINOGEN 0.2 12/15/2014 1956    NITRITE NEGATIVE 03/26/2024 1410   LEUKOCYTESUR LARGE (A) 03/26/2024 1410    ----------------------------------------------------------------------------------------------------------------   Imaging Results:    CT Angio Abd/Pel W and/or Wo Contrast Result Date: 03/26/2024 CLINICAL DATA:  Epigastric pain radiating to lower abdomen for 3-4 days, progressive worsening, nausea, history of mesenteric artery stenosis EXAM: CTA ABDOMEN AND PELVIS WITHOUT AND WITH CONTRAST TECHNIQUE: Multidetector CT imaging of the abdomen and pelvis was performed using the standard protocol during bolus administration of intravenous contrast. Multiplanar reconstructed images and MIPs were obtained and reviewed to evaluate the vascular anatomy. RADIATION DOSE REDUCTION: This exam was performed according to the departmental dose-optimization program which includes automated exposure control, adjustment of the mA and/or kV according to patient size and/or use of iterative reconstruction technique. CONTRAST:  80mL OMNIPAQUE  IOHEXOL  350 MG/ML SOLN COMPARISON:  03/09/2024, 02/24/2024 FINDINGS: VASCULAR Aorta: Normal caliber aorta without aneurysm, dissection, vasculitis or significant stenosis. Diffuse atherosclerosis unchanged. 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 Distal branches of the celiac are patent, with no evidence of aneurysm, dissection, or vasculitis. SMA: There is a stent at the origin of the SMA, which appears widely patent. Distal branches of the SMA are normal, with no evidence of aneurysm, dissection, or vasculitis. Renals: There is significant stenosis at the origin of the bilateral main renal arteries, estimated 70-90% stenosis on the right and 50-70% stenosis on the left. There are 2 additional accessory left renal arteries arising from the aorta, supplying the lower pole left kidney, which appear widely patent throughout their courses. No evidence of aneurysm,  dissection, vasculitis, or fibromuscular dysplasia. IMA: The IMA is not severe stenosis at the origin of the IMA,  though the distal branches are normal caliber and widely patent, likely via collateral flow. No evidence of aneurysm, dissection, or vasculitis. Inflow: Patent without evidence of aneurysm, dissection, vasculitis or significant stenosis. Stable diffuse atherosclerosis. Proximal Outflow: Pseudoaneurysm of the right common femoral artery is again noted, measuring approximately 2.3 x 2.2 cm, previously measuring 2.2 x 3.0 cm on the 09/22/2023 exam. Significant mural thrombus has developed within the pseudoaneurysm since the prior exams, with a thin patent neck and minimal central flow identified on this study. If further evaluation is clinically indicated, ultrasound could be performed. The bilateral common femoral, superficial femoral, and profundus femoral arteries are patent. No dissection, vasculitis, or critical stenosis. Diffuse atherosclerosis unchanged. Veins: No obvious venous abnormality within the limitations of this arterial phase study. Review of the MIP images confirms the above findings. NON-VASCULAR Lower chest: No acute pleural or parenchymal lung disease. Hepatobiliary: Calcified gallstones without cholecystitis. Unremarkable appearance of the liver. No biliary duct dilation or choledocholithiasis. Pancreas: Unremarkable. No pancreatic ductal dilatation or surrounding inflammatory changes. Spleen: Normal in size without focal abnormality. Adrenals/Urinary Tract: Mild diffuse bladder wall thickening and perivesicular fat stranding concerning for cystitis. Please correlate with urinalysis. Stable bilateral renal cortical thinning. No abnormal renal enhancement. No urinary tract calculi or obstructive uropathy. The adrenals are stable. Stomach/Bowel: No bowel obstruction or ileus. The appendix is surgically absent. Scattered colonic diverticulosis, most pronounced within the ascending colon,  with no evidence of acute diverticulitis. There is no bowel wall thickening or inflammatory change. No CT findings of bowel wall ischemia. Lymphatic: No pathologic adenopathy. Reproductive: Status post hysterectomy. No adnexal masses. Other: No free fluid or free intraperitoneal gas. No abdominal wall hernia. Prior ventral hernia repair. Musculoskeletal: No acute or destructive bony abnormalities. Reconstructed images demonstrate no additional findings. IMPRESSION: VASCULAR 1. Stent within the origin of the superior mesenteric artery, which appears widely patent. 2. 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 3. 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 Two accessory left renal artery supplying the lower pole are widely patent. 4. Chronic right common femoral artery pseudoaneurysm, which has decreased in overall size and developed increased mural thrombus since prior exams. A thin patent neck and minimal central enhancement are identified. If further evaluation is clinically indicated, ultrasound could be performed. 5.  Aortic Atherosclerosis (ICD10-I70.0). NON-VASCULAR 1. Mild diffuse bladder wall thickening and perivesicular fat stranding, compatible with cystitis. Please correlate with urinalysis. 2. Cholelithiasis without cholecystitis. 3. Scattered colonic diverticulosis without diverticulitis. Electronically Signed   By: Ozell Daring M.D.   On: 03/26/2024 15:47    EKG:  Vent. 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 Luverne, interventional team will assess in a.m., if intervention is needed likely will be done on Monday. -  Will add Plavix , continue with aspirin  as discussed with IR. - Keep on full liquid diet, likely will be unable to advance as she is with significant pain even with liquids. - Will check lipid panel (but she is having significant myalgia to different statins, can discussed with patient pending her lipid panel results)  Pyuria -She does report some mild polyuria, but she had history of C. difficile in the past, so I will hold on starting any antibiotics and follow on urine cultures (she grew up Pseudomonas and Klebsiella over the last few months)  T2DM, well-controlled Hemoglobin A1c on 12/26/2023 was 6.3 Hold metformin  given she received IV contrast, an angiogram is anticipated by IR as well Will keep on insulin  sliding scale   Essential hypertension -Continue amlodipine , hydralazine , will hold Avapro  and keep on as needed hydralazine  -Try to avoid low blood pressure   GERD Continue Protonix  twice daily   Generalized anxiety disorder Continue Xanax      DVT Prophylaxis  Lovenox    AM Labs Ordered, also please review Full Orders  Family Communication: Admission, patients condition and plan of care including tests being ordered have been discussed with the patient and son by phone who indicate understanding and agree with the plan and Code Status.  Code Status DNR form present from SNF, as well this was confirmed by the patient  Likely DC to back to ALF  Consults called: IR, discussed with Dr. Rosalie  Admission status: Inpatient  Time spent in minutes  : 70 minutes   Brayton Lye M.D on 03/26/2024 at 7:39 PM   [facility] - Office  [phone]

## 2024-03-26 NOTE — ED Provider Notes (Signed)
 [facility] AT [facility] Provider Note   CSN: [id number] Arrival date & time: 03/26/24  1251     Patient presents with: Abdominal Pain   [name] is a 89 y.o. female.    Abdominal Pain Associated symptoms: nausea   Associated symptoms: no chills, no cough, no diarrhea, no dysuria, no fever, no shortness of breath and no vomiting        [name] is a 89 y.o. female with past medical history of type 2 diabetes, hypertension, mesenteric artery stenosis with stent placed in 2024 who presents to the Emergency Department complaining of recurrence of generalized abdominal pain nausea and decreased appetite.  Symptoms present for 3 to 4 days.  States she is having nausea without vomiting and diffuse pain of her abdomen.  States the pain in her abdomen is causing burning discomfort in her lower chest.  Tolerating liquids but unable to eat any solid food for several days.  She tried eating 2 hush puppies last evening and pain became much worse. She is followed by Dr. Cinderella with GI, had follow-up recently in office and ultrasound of mesenteric arteries that showed narrowing at the stent.  Referred back to IR for follow-up but patient does not have appointment till mid October.  States her current pain feels similar to her symptoms prior to placement of the stent.  She denies any vomiting.  She states she has had several semiformed stools that have been nonbloody or black.  Denies any fever or shortness of breath.  No dysuria symptoms.    Prior to Admission medications   Medication Sig Start Date End Date Taking? Authorizing Provider  acetaminophen  (TYLENOL ) 325 MG tablet TAKE (2) TABLETS BY MOUTH EVERY SIX HOURS AS NEEDED FOR PAIN. MAX 3GM IN 24 HOURS. 07/28/23   [name], MD  albuterol  (VENTOLIN  HFA) 108 (90 Base) MCG/ACT inhaler INHALE 2 PUFFS INTO THE LUNGS EVERY SIX HOURS AS NEEDED FOR WHEEZING OR SHORTNESS OF BREATH. 09/26/23   [name], MD  ALPRAZolam  (XANAX ) 0.25 MG tablet TAKE (1) TABLET BY MOUTH AT BEDTIME FOR SLEEP. 02/25/24   [name], MD  amLODipine  (NORVASC ) 10 MG tablet TAKE (1) TABLET BY MOUTH ONCE DAILY. 10/07/23   [name], MD  aspirin  (ASPIRIN  LOW DOSE) 81 MG chewable tablet CHEW (1) TABLET BY MOUTH ONCE DAILY. 09/06/23   [name], MD  BETA CAROTENE PROVITAMIN A 25000 units capsule TAKE (1) CAPSULE BY MOUTH AT BEDTIME. 02/24/24   Luking, Glendia LABOR, MD  BISACODYL  5 MG EC tablet TAKE 1 TABLET BY MOUTH ONCE DAILY AS NEEDED FOR MODERATE CONSTIPATION. 12/20/22   [name], MD  cyanocobalamin  1000 MCG tablet Take 1,000 mcg by mouth at bedtime.    [provider]  cycloSPORINE  (RESTASIS ) 0.05 % ophthalmic emulsion Place 1 drop into both eyes 2 (two) times daily.    [provider]  dicyclomine  (BENTYL ) 20 MG tablet Take 1 every 8 hours as needed for abdominal cramping 02/24/24   Suzette Pac, MD  estradiol  (ESTRACE ) 0.1 MG/GM vaginal cream Discard plastic applicator. Insert a blueberry size amount (approximately 1 gram) of cream on fingertip inside vagina at bedtime every night for 1 week then every other night. For long term use. 01/15/24   Gerldine Lauraine BROCKS, FNP  gabapentin  (NEURONTIN ) 100 MG capsule 1 nightly for neuropathy 03/08/24   [name], MD  glucose blood (EASYMAX TEST) test strip CHECK BLOOD SUGAR  ONCE DAILY.(CALL MD IF BS BELOW 60: OR IF BS ABOVE 400) 10/07/23   Luking, Glendia LABOR, MD  hydrALAZINE  (APRESOLINE ) 25 MG tablet Take 1 tablet (25 mg total) by mouth 3 (three) times daily. 12/31/23   [name], MD  hydrOXYzine  (ATARAX ) 25 MG tablet Take 1 at bedtime to help with sleep 02/24/24   Zammit, Joseph, MD  IVIZIA DRY EYES 0.5 % SOLN Place 1 drop into both eyes 2 (two) times daily. 09/22/23   [provider]  ketoconazole  (NIZORAL ) 2 % cream Apply 1 Application topically 2 (two) times daily. For 10 days to groin rash 03/03/24   [name], MD  levofloxacin   (LEVAQUIN ) 750 MG tablet Take 750 mg by mouth daily.    [provider]  loratadine  (ALLERGY RELIEF) 10 MG tablet TAKE (1) TABLET BY MOUTH ONCE DAILY. Patient taking differently: Take 10 mg by mouth daily. 10/07/23   [name], MD  Melatonin 10 MG TABS Take 10 mg by mouth at bedtime. 03/24/24   Grooms, Courtney, PA-C  metFORMIN  (GLUCOPHAGE ) 500 MG tablet TAKE (1) TABLET BY MOUTH IN THE MORNING & (1/2) TABLET (250MG ) BY MOUTH AT SUPPER 10/07/23   Luking, Glendia LABOR, MD  methenamine  (HIPREX ) 1 g tablet Take 1 tablet (1 g total) by mouth 2 (two) times daily with a meal. Most effective when taken with a daily Vitamin C supplement. 01/15/24   Gerldine Lauraine BROCKS, FNP  Multiple Vitamins-Minerals (PRESERVISION AREDS 2) CAPS Take 1 capsule by mouth in the morning and at bedtime.    [provider]  mupirocin  ointment (BACTROBAN ) 2 % Apply thin amount twice daily to rash in groin creases twice daily for 5 days at a time when rash occurs 03/07/24   [name], MD  ondansetron  (ZOFRAN -ODT) 4 MG disintegrating tablet 4mg  ODT q4 hours prn nausea/vomit 09/05/23   Zammit, Joseph, MD  pantoprazole  (PROTONIX ) 40 MG tablet Take 1 tablet (40 mg total) by mouth 2 (two) times daily before a meal. 12/24/23   Cook, Jayce G, DO  pantoprazole  (PROTONIX ) 40 MG tablet Take 1 tablet (40 mg total) by mouth daily. 02/18/24   Ahmed, Deatrice FALCON, MD  phenazopyridine  (PYRIDIUM ) 100 MG tablet Take 1 tablet (100 mg total) by mouth 3 (three) times daily. 01/02/24   Suellen Cantor A, PA-C  polyethylene glycol powder (GOODSENSE CLEARLAX) 17 GM/SCOOP powder 1 scoop in 8 ounces water  once daily as needed for constipation Patient taking differently: Take 17 g by mouth daily as needed for mild constipation. 1 scoop in 8 ounces water  once daily as needed for constipation 06/03/23   Luking, Scott A, MD  psyllium (METAMUCIL) 58.6 % packet Take 1 packet by mouth 2 (two) times daily. 02/18/24 05/18/24  Ahmed, Muhammad F, MD  Safety Lancets  28G MISC CHECK BLOOD SUGAR ONCE DAILY. 01/21/24   [name], MD  sertraline  (ZOLOFT ) 25 MG tablet TAKE (1) TABLET BY MOUTH ONCE DAILY. Patient taking differently: Take 25 mg by mouth daily. 10/07/23   [name], MD  sodium chloride  1 g tablet TAKE (1) TABLET BY MOUTH TWICE DAILY WITH A MEAL. 01/26/24   [name], MD  sucralfate  (CARAFATE ) 1 g tablet Crush and mix with 30 ml of water  three times a day for 2 weeks 01/13/24   [name], MD  tamsulosin  (FLOMAX ) 0.4 MG CAPS capsule Take 1 capsule (0.4 mg total) by mouth daily. 03/08/24   Matilda Senior, MD  valsartan  (DIOVAN ) 320  MG tablet Take 1 tablet (320 mg total) by mouth daily. 12/22/23   [name], MD  Vitamin D , Cholecalciferol , 10 MCG (400 UNIT) TABS TAKE (1) TABLET BY MOUTH ONCE DAILY. Patient taking differently: Take 1 tablet by mouth daily. 10/07/23   [name], MD    Allergies: Statins, Phenergan [promethazine hcl], Hydrocortisone, Lisinopril, Prednisone , and Trazodone  and nefazodone    Review of Systems  Constitutional:  Negative for appetite change, chills and fever.  Respiratory:  Negative for cough and shortness of breath.   Gastrointestinal:  Positive for abdominal pain and nausea. Negative for diarrhea and vomiting.  Genitourinary:  Negative for dysuria and flank pain.  Musculoskeletal:  Negative for back pain.  Neurological:  Negative for weakness and numbness.    Updated Vital Signs BP (!) 170/44   Pulse 77   Temp 98.6 F (37 C) (Oral)   Ht 5' 8 (1.727 m)   Wt 79 kg   SpO2 98%   BMI 26.48 kg/m   Physical Exam Vitals and nursing note reviewed.  Constitutional:      General: She is not in acute distress.    Appearance: She is well-developed.  HENT:     Mouth/Throat:     Mouth: Mucous membranes are moist.  Cardiovascular:     Rate and Rhythm: Normal rate and regular rhythm.     Pulses: Normal pulses.  Pulmonary:     Effort: Pulmonary effort is normal.  Abdominal:     General:  There is no distension.     Palpations: Abdomen is soft.     Tenderness: There is abdominal tenderness. There is no right CVA tenderness, left CVA tenderness or guarding.     Comments: Diffuse ttp of the mid abdomen. No guarding or rebound tenderness.   Musculoskeletal:     Right lower leg: No edema.     Left lower leg: No edema.  Skin:    General: Skin is warm.     Capillary Refill: Capillary refill takes less than 2 seconds.  Neurological:     General: No focal deficit present.     Mental Status: She is alert.     Sensory: No sensory deficit.     Motor: No weakness.     (all labs ordered are listed, but only abnormal results are displayed) Labs Reviewed  COMPREHENSIVE METABOLIC PANEL WITH GFR - Abnormal; Notable for the following components:      Result Value   Chloride 97 (*)    Glucose, Bld 128 (*)    All other components within normal limits  CBC WITH DIFFERENTIAL/PLATELET - Abnormal; Notable for the following components:   Hemoglobin 10.6 (*)    HCT 34.0 (*)    All other components within normal limits  URINALYSIS, ROUTINE W REFLEX MICROSCOPIC - Abnormal; Notable for the following components:   APPearance TURBID (*)    Protein, ur 30 (*)    Leukocytes,Ua LARGE (*)    Bacteria, UA FEW (*)    All other components within normal limits  URINE CULTURE  LIPASE, BLOOD  BASIC METABOLIC PANEL WITH GFR  CBC  HEMOGLOBIN A1C  LIPID PANEL    EKG: EKG Interpretation Date/Time:  Friday March 26 2024 14:19:28 EDT Ventricular Rate:  78 PR Interval:  196 QRS Duration:  89 QT Interval:  397 QTC Calculation: 453 R Axis:   15  Text Interpretation: Sinus arrhythmia Low voltage, precordial leads 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 Multiplanar reconstructed images and MIPs were obtained and reviewed to evaluate the vascular anatomy. RADIATION DOSE REDUCTION: This exam was performed according to the departmental dose-optimization program which includes automated exposure control, adjustment of the mA and/or kV according to patient size and/or use of iterative reconstruction technique. CONTRAST:  80mL OMNIPAQUE  IOHEXOL  350 MG/ML SOLN COMPARISON:  03/09/2024, 02/24/2024 FINDINGS: VASCULAR Aorta: Normal caliber aorta without aneurysm, dissection, vasculitis or significant stenosis. Diffuse atherosclerosis unchanged. 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 Distal branches of the celiac are patent, with no evidence of aneurysm, dissection, or vasculitis. SMA: There is a stent at the origin of the SMA, which appears widely patent. Distal branches of the SMA are normal, with no evidence of aneurysm, dissection, or vasculitis. Renals: There is significant stenosis at the origin of the bilateral main renal arteries, estimated 70-90% stenosis on the right and 50-70% stenosis on the left. There are 2 additional accessory left renal arteries arising from the aorta, supplying the lower pole left kidney, which appear widely patent throughout their courses. No evidence of aneurysm, dissection, vasculitis, or fibromuscular dysplasia. IMA: The IMA is not severe stenosis at the origin of the IMA, though the distal branches are normal caliber and widely patent, likely via collateral flow. No evidence of aneurysm, dissection, or vasculitis. Inflow: Patent without evidence of aneurysm, dissection, vasculitis or significant stenosis. Stable diffuse atherosclerosis. Proximal  Outflow: Pseudoaneurysm of the right common femoral artery is again noted, measuring approximately 2.3 x 2.2 cm, previously measuring 2.2 x 3.0 cm on the 09/22/2023 exam. Significant mural thrombus has developed within the pseudoaneurysm since the prior exams, with a thin patent neck and minimal central flow identified on this study. If further evaluation is clinically indicated, ultrasound could be performed. The bilateral common femoral, superficial femoral, and profundus femoral arteries are patent. No dissection, vasculitis, or critical stenosis. Diffuse atherosclerosis unchanged. Veins: No obvious venous abnormality within the limitations of this arterial phase study. Review of the MIP images confirms the above findings. NON-VASCULAR Lower chest: No acute pleural or parenchymal lung disease. Hepatobiliary: Calcified gallstones without cholecystitis. Unremarkable appearance of the liver. No biliary duct dilation or choledocholithiasis. Pancreas: Unremarkable. No pancreatic ductal dilatation or surrounding inflammatory changes. Spleen: Normal in size without focal abnormality. Adrenals/Urinary Tract: Mild diffuse bladder wall thickening and perivesicular fat stranding concerning for cystitis. Please correlate with urinalysis. Stable bilateral renal cortical thinning. No abnormal renal enhancement. No urinary tract calculi or obstructive uropathy. The adrenals are stable. Stomach/Bowel: No bowel obstruction or ileus. The appendix is surgically absent. Scattered colonic diverticulosis, most pronounced within the ascending colon, with no evidence of acute diverticulitis. There is no bowel wall thickening or inflammatory change. No CT findings of bowel wall ischemia. Lymphatic: No pathologic adenopathy. Reproductive: Status post hysterectomy. No adnexal masses. Other: No free fluid or free intraperitoneal gas. No abdominal wall hernia. Prior ventral hernia repair. Musculoskeletal: No acute or destructive bony  abnormalities. Reconstructed images demonstrate no additional findings. IMPRESSION: VASCULAR 1. Stent within the origin of the superior mesenteric artery, which appears widely patent. 2. 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 3. 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 Two accessory left renal artery supplying the lower pole are widely patent. 4. Chronic right common femoral artery pseudoaneurysm, which has decreased in overall size and developed increased mural thrombus since prior exams. A thin patent neck and minimal central enhancement are identified. If further evaluation is clinically indicated, ultrasound could be performed. 5.  Aortic Atherosclerosis (ICD10-I70.0). NON-VASCULAR 1. Mild diffuse bladder wall thickening and perivesicular fat stranding, compatible with cystitis. Please correlate with urinalysis. 2. Cholelithiasis without cholecystitis. 3. Scattered colonic diverticulosis without diverticulitis. Electronically Signed   By: Ozell Daring M.D.   On: 03/26/2024 15:47     Procedures   Medications Ordered in the ED - No data to display                                  Medical Decision Making Patient here with postprandial pain for 3 to 4 days.  Nausea without vomiting no fever or chills.  States her pain is similar to the abdominal pain she was experiencing prior to her SMA stent.    Abdomen is diffusely tender on exam.  No guarding or rebound and her abdomen is soft  Concern for acute mesenteric ischemia, also acute on chronic mesenteric stenosis.  No reported diarrhea, doubt C. difficile infection as she denies any recent antibiotic use.  Neoplasm, gastritis, diverticulitis, appendicitis SBO all considered  Amount and/or Complexity of Data Reviewed Labs: ordered.    Details: Labs no significant leukocytosis, chemistries are reassuring.  Lipase is unremarkable,  urinalysis shows turbid urine with large leukocytes and greater than 50 white cells with few bacteria.  Urine cultures pending Radiology: ordered.    Details: CTA abdomen and pelvis shows stent within the superior mesenteric artery appears patent.  There is high-grade stenosis at the origin of the celiac artery estimated at 70-90%.  This was at approximately 50% on previous imaging. ECG/medicine tests: ordered.    Details: Sinus arrhythmia low voltage precordial leads.  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  Discussed with patient's GI provider, Dr. Cinderella who recommends hospital admission with patient to be seen by IR  Urine shows possible cystitis, patient denies any burning with urination but does endorse some increased frequency.  She has a history of C. difficile infection, I will culture her urine and hesitant to give any antibiotics at this time given her history of C. difficile.  She may require antibiotics once her culture is back, but I do not think that this is the source of her abdominal pain.  Discussed findings with [facility] IR, Dr. Luverne.  If pt pain is controlled, felt that she would not require emergent intervention over the weekend and could possibly be discharged with close f/u with IR in clinic  I have discussed this with the patient and she continues to have some pain despite pain medication given here and she does not feel comfortable with d/c.  She was able to tolerate an Ensure here  Discussed with [facility] hospitalist, Dr. Sherlon who agrees to evaluate  patient and she will be admitted and transferred to [facility]  Risk Prescription drug management. Decision regarding hospitalization.        Final diagnoses:  Mesenteric angina  ED Discharge Orders     None          Herlinda Milling, DEVONNA 03/26/24 1949    Francesca Elsie CROME,  MD 03/27/24 1504

## 2024-03-27 DIAGNOSIS — K551 Chronic vascular disorders of intestine: Secondary | ICD-10-CM | POA: Diagnosis not present

## 2024-03-27 LAB — BASIC METABOLIC PANEL WITH GFR
Anion gap: 9 (ref 5–15)
BUN: 18 mg/dL (ref 8–23)
CO2: 25 mmol/L (ref 22–32)
Calcium: 9.2 mg/dL (ref 8.9–10.3)
Chloride: 100 mmol/L (ref 98–111)
Creatinine, Ser: 0.97 mg/dL (ref 0.44–1.00)
GFR, Estimated: 56 mL/min — ABNORMAL LOW (ref >60)
Glucose, Bld: 120 mg/dL — ABNORMAL HIGH (ref 70–99)
Potassium: 4.7 mmol/L (ref 3.5–5.1)
Sodium: 134 mmol/L — ABNORMAL LOW (ref 135–145)

## 2024-03-27 LAB — GLUCOSE, CAPILLARY
Glucose-Capillary: 121 mg/dL — ABNORMAL HIGH (ref 70–99)
Glucose-Capillary: 104 mg/dL — ABNORMAL HIGH (ref 70–99)
Glucose-Capillary: 147 mg/dL — ABNORMAL HIGH (ref 70–99)
Glucose-Capillary: 116 mg/dL — ABNORMAL HIGH (ref 70–99)

## 2024-03-27 LAB — CBC
HCT: 31.3 % — ABNORMAL LOW (ref 36.0–46.0)
Hemoglobin: 10 g/dL — ABNORMAL LOW (ref 12.0–15.0)
MCH: 27.9 pg (ref 26.0–34.0)
MCHC: 31.9 g/dL (ref 30.0–36.0)
MCV: 87.2 fL (ref 80.0–100.0)
Platelets: 258 K/uL (ref 150–400)
RBC: 3.59 MIL/uL — ABNORMAL LOW (ref 3.87–5.11)
RDW: 14.2 % (ref 11.5–15.5)
WBC: 6.1 K/uL (ref 4.0–10.5)
nRBC: 0 % (ref 0.0–0.2)

## 2024-03-27 LAB — LIPID PANEL
Cholesterol: 163 mg/dL (ref 0–200)
HDL: 43 mg/dL (ref >40)
LDL Cholesterol: 91 mg/dL (ref 0–99)
Total CHOL/HDL Ratio: 3.8 ratio
Triglycerides: 144 mg/dL (ref <150)
VLDL: 29 mg/dL (ref 0–40)

## 2024-03-27 MED ORDER — VITAMINS A & D EX OINT
TOPICAL_OINTMENT | CUTANEOUS | Status: DC | PRN
  Administered 2024-03-28: 1 via TOPICAL
  Filled 2024-03-27: qty 56.7

## 2024-03-27 NOTE — Progress Notes (Signed)
 OT Cancellation Note  Patient Details Name: [name] MRN: [id number] DOB: [birth year]   Cancelled Treatment:    Reason Eval/Treat Not Completed: OT screened, no needs identified, will sign off.  Per discussion with PT pt is close to functional baseline for ADLs and mobility and will be able to return to ALF.  No further OT needs at this time  Ivanell Deshotel OTR/L 03/27/2024, 5:09 PM

## 2024-03-27 NOTE — Progress Notes (Signed)
 PROGRESS NOTE  [name] FMW:[id number] DOB: 07-Feb-1932 DOA: 03/26/2024 PCP: [name], MD   LOS: 1 day   Brief Narrative / Interim history: 89 y.o. female, with medical history significant of hypertension, hyperlipidemia, T2DM, GERD mesenteric artery stenosis and stent placement, history of urinary retention with recurrent UTIs, she lives at ALF, came into the hospital with abdominal pain, nausea, poor appetite.  She has a history of mesenteric ischemia with prior SMA stent.  He has a history of mesenteric underwent a CT angiogram which showed patent SMA stent, however celiac artery stenosis.  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  Admitting MD discussed with IR Dr. Luverne, IR team will assess - Continue liquid diet for now, continue aspirin  and Plavix   Active problems Pyuria-no significant symptoms, due to history of C. difficile hold antibiotics still  DM2-controlled, A1c was 6.3 just recently.  Keep on sliding scale.  Hold metformin   Essential hypertension-continue amlodipine , hydralazine , hold Avapro   Generalized anxiety disorder-continue Xanax   Scheduled Meds:  amLODipine   10 mg Oral Daily   artificial tears  1 drop Both Eyes BID   aspirin   81 mg Oral Daily   clopidogrel   75 mg Oral Daily   cyanocobalamin   1,000 mcg Oral QHS   cycloSPORINE   1 drop Both Eyes BID   docusate sodium   100 mg Oral BID   enoxaparin  (LOVENOX ) injection  40 mg Subcutaneous Q24H   estradiol   1 Applicatorful Vaginal QHS   feeding supplement (GLUCERNA SHAKE)  237 mL Oral TID BM    gabapentin   100 mg Oral q AM   hydrALAZINE   25 mg Oral TID   insulin  aspart  0-9 Units Subcutaneous TID WC   melatonin  9 mg Oral QHS   methenamine   1 g Oral BID WC   multivitamin  1 tablet Oral Daily   pantoprazole   40 mg Oral BID AC   tamsulosin   0.4 mg Oral Daily   Continuous Infusions:  lactated ringers  50 mL/hr at 03/26/24 2138   PRN Meds:.acetaminophen  **OR** acetaminophen , albuterol , ALPRAZolam , bisacodyl , hydrALAZINE , morphine  injection, ondansetron  **OR** ondansetron  (ZOFRAN ) IV, polyethylene glycol  Current Outpatient Medications  Medication Instructions   acetaminophen  (TYLENOL ) 325 MG tablet TAKE (2) TABLETS BY MOUTH EVERY SIX HOURS AS NEEDED FOR PAIN. MAX 3GM IN 24 HOURS.   albuterol  (VENTOLIN  HFA) 108 (90 Base) MCG/ACT inhaler 2 puffs, Inhalation, Every 6 hours PRN   ALPRAZolam  (XANAX ) 0.25 MG tablet TAKE (1) TABLET BY MOUTH AT BEDTIME FOR SLEEP.   amLODipine  (NORVASC ) 10 MG tablet TAKE (1) TABLET BY MOUTH ONCE DAILY.   aspirin  (ASPIRIN  LOW DOSE) 81 MG chewable tablet CHEW (1) TABLET BY MOUTH ONCE DAILY.   BETA CAROTENE PROVITAMIN A 25000 units capsule TAKE (1) CAPSULE BY MOUTH AT BEDTIME.   BISACODYL  5 MG EC tablet TAKE 1 TABLET BY MOUTH ONCE DAILY AS NEEDED FOR MODERATE CONSTIPATION.   cyanocobalamin  1,000 mcg, Daily at bedtime   cycloSPORINE  (RESTASIS ) 0.05 % ophthalmic emulsion 1 drop, 2 times daily   estradiol  (ESTRACE ) 0.1 MG/GM vaginal cream Discard plastic applicator. Insert a blueberry size amount (approximately 1 gram) of cream on fingertip  inside vagina at bedtime every night for 1 week then every other night. For long term use.   gabapentin  (NEURONTIN ) 100 MG capsule 1 nightly for neuropathy   glucose blood (EASYMAX TEST) test strip CHECK BLOOD SUGAR ONCE DAILY.(CALL MD IF BS BELOW 60: OR IF BS ABOVE 400)   hydrALAZINE  (APRESOLINE ) 25 mg, Oral, 3 times daily   IVIZIA DRY EYES 0.5 % SOLN 1 drop, 2 times daily   ketoconazole  (NIZORAL ) 2 % cream 1 Application,  Topical, 2 times daily, For 10 days to groin rash   Melatonin 10 mg, Oral, Daily at bedtime   METAMUCIL 4 IN 1 FIBER 25 % PACK Oral, 2 times daily   metFORMIN  (GLUCOPHAGE ) 500 MG tablet TAKE (1) TABLET BY MOUTH IN THE MORNING & (1/2) TABLET (250MG ) BY MOUTH AT SUPPER   methenamine  (HIPREX ) 1 g, Oral, 2 times daily with meals, Most effective when taken with a daily Vitamin C supplement.   Multiple Vitamins-Minerals (PRESERVISION AREDS 2) CAPS 1 capsule, 2 times daily   mupirocin  ointment (BACTROBAN ) 2 % Apply thin amount twice daily to rash in groin creases twice daily for 5 days at a time when rash occurs   ondansetron  (ZOFRAN -ODT) 4 MG disintegrating tablet 4mg  ODT q4 hours prn nausea/vomit   pantoprazole  (PROTONIX ) 40 mg, Oral, 2 times daily before meals   polyethylene glycol powder (GOODSENSE CLEARLAX) 17 GM/SCOOP powder 1 scoop in 8 ounces water  once daily as needed for constipation   Safety Lancets 28G MISC CHECK BLOOD SUGAR ONCE DAILY.   sodium chloride  1 g tablet TAKE (1) TABLET BY MOUTH TWICE DAILY WITH A MEAL.   tamsulosin  (FLOMAX ) 0.4 mg, Oral, Daily   valsartan  (DIOVAN ) 320 mg, Oral, Daily   Vitamin D , Cholecalciferol , 10 MCG (400 UNIT) TABS TAKE (1) TABLET BY MOUTH ONCE DAILY.    Diet Orders (From admission, onward)     Start     Ordered   03/26/24 1925  Diet full liquid Room service appropriate? Yes; Fluid consistency: Thin  Diet effective now       Question Answer Comment  Room service appropriate? Yes   Fluid consistency: Thin      03/26/24 1924            DVT prophylaxis: enoxaparin  (LOVENOX ) injection 40 mg Start: 03/26/24 2200 SCDs Start: 03/26/24 1925   Lab Results  Component Value Date   PLT 251 03/26/2024      Code Status: Limited: Do not attempt resuscitation (DNR) -DNR-LIMITED -Do Not Intubate/DNI   Family Communication: No family at bedside  Status is: Inpatient Remains inpatient appropriate because: Severity of illness  Level of care:  Telemetry Medical  Consultants:  Interventional radiology  Objective: Vitals:   03/26/24 2106 03/27/24 0020 03/27/24 0440 03/27/24 0800  BP: (!) 155/51 (!) 113/48 (!) 127/45 (!) 153/49  Pulse: 63 60 (!) 59 64  Resp: 18 17 18    Temp: (!) 97.4 F (36.3 C) 98 F (36.7 C) 98 F (36.7 C) 97.7 F (36.5 C)  TempSrc: Oral Oral Oral Oral  SpO2: 93% 95% 94% 99%  Weight:      Height:        Intake/Output Summary (Last 24 hours) at 03/27/2024 1004 Last data filed at 03/27/2024 0949 Gross per 24 hour  Intake 507.99 ml  Output --  Net 507.99 ml   Wt Readings from Last 3 Encounters:  03/26/24 79 kg  03/24/24 79.8 kg  03/03/24 78.5 kg    Examination:  Constitutional: NAD Eyes:  no scleral icterus ENMT: Mucous membranes are moist.  Neck: normal, supple Respiratory: clear to auscultation bilaterally, no wheezing, no crackles. Normal respiratory effort. Cardiovascular: Regular rate and rhythm, no murmurs / rubs / gallops. No LE edema.  Abdomen: non distended, no tenderness. Bowel sounds positive.  Musculoskeletal: no clubbing / cyanosis.   Data Reviewed: I have independently reviewed following labs and imaging studies   CBC Recent Labs  Lab 03/26/24 1326  WBC 7.1  HGB 10.6*  HCT 34.0*  PLT 251  MCV 87.6  MCH 27.3  MCHC 31.2  RDW 14.1  LYMPHSABS 1.8  MONOABS 0.7  EOSABS 0.1  BASOSABS 0.0    Recent Labs  Lab 03/26/24 1326  NA 136  K 3.9  CL 97*  CO2 24  GLUCOSE 128*  BUN 15  CREATININE 0.78  CALCIUM 9.9  AST 18  ALT 15  ALKPHOS 93  BILITOT 1.0  ALBUMIN 4.0    ------------------------------------------------------------------------------------------------------------------ No results for input(s): CHOL, HDL, LDLCALC, TRIG, CHOLHDL, LDLDIRECT in the last 72 hours.  Lab Results  Component Value Date   HGBA1C 6.3 (H) 12/26/2023    ------------------------------------------------------------------------------------------------------------------ No results for input(s): TSH, T4TOTAL, T3FREE, THYROIDAB in the last 72 hours.  Invalid input(s): FREET3  Cardiac Enzymes No results for input(s): CKMB, TROPONINI, MYOGLOBIN in the last 168 hours.  Invalid input(s): CK ------------------------------------------------------------------------------------------------------------------    Component Value Date/Time   BNP 88.0 09/22/2023 1848    CBG: Recent Labs  Lab 03/27/24 0818  GLUCAP 121*    No results found for this or any previous visit (from the past 240 hours).   Radiology Studies: CT Angio Abd/Pel W and/or Wo Contrast Result Date: 03/26/2024 CLINICAL DATA:  Epigastric pain radiating to lower abdomen for 3-4 days, progressive worsening, nausea, history of mesenteric artery stenosis EXAM: CTA ABDOMEN AND PELVIS WITHOUT AND WITH CONTRAST TECHNIQUE: Multidetector CT imaging of the abdomen and pelvis was performed using the standard protocol during bolus administration of intravenous contrast. Multiplanar reconstructed images and MIPs were obtained and reviewed to evaluate the vascular anatomy. RADIATION DOSE REDUCTION: This exam was performed according to the departmental dose-optimization program which includes automated exposure control, adjustment of the mA and/or kV according to patient size and/or use of iterative reconstruction technique. CONTRAST:  80mL OMNIPAQUE  IOHEXOL  350 MG/ML SOLN COMPARISON:  03/09/2024, 02/24/2024 FINDINGS: VASCULAR Aorta: Normal caliber aorta without aneurysm, dissection, vasculitis or significant stenosis. Diffuse atherosclerosis unchanged. 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 Distal branches of the celiac are patent, with no evidence of aneurysm, dissection, or vasculitis. SMA: There is a stent at the origin of the SMA,  which appears widely patent. Distal branches of the SMA are normal, with no evidence of aneurysm, dissection, or vasculitis. Renals: There is significant stenosis at the origin of the bilateral main renal arteries, estimated 70-90% stenosis on the right and 50-70% stenosis on the left. There are 2 additional accessory left renal arteries arising from the aorta, supplying the lower pole left kidney, which appear widely patent throughout their courses. No evidence of aneurysm, dissection, vasculitis, or fibromuscular dysplasia. IMA: The IMA is not severe stenosis at the origin of the IMA, though the distal branches are normal caliber and widely patent, likely via collateral flow. No evidence of aneurysm, dissection, or vasculitis. Inflow: Patent without evidence of aneurysm, dissection, vasculitis or significant stenosis. Stable diffuse atherosclerosis. Proximal Outflow: Pseudoaneurysm of the right common femoral artery is again noted, measuring approximately 2.3 x 2.2 cm, previously  measuring 2.2 x 3.0 cm on the 09/22/2023 exam. Significant mural thrombus has developed within the pseudoaneurysm since the prior exams, with a thin patent neck and minimal central flow identified on this study. If further evaluation is clinically indicated, ultrasound could be performed. The bilateral common femoral, superficial femoral, and profundus femoral arteries are patent. No dissection, vasculitis, or critical stenosis. Diffuse atherosclerosis unchanged. Veins: No obvious venous abnormality within the limitations of this arterial phase study. Review of the MIP images confirms the above findings. NON-VASCULAR Lower chest: No acute pleural or parenchymal lung disease. Hepatobiliary: Calcified gallstones without cholecystitis. Unremarkable appearance of the liver. No biliary duct dilation or choledocholithiasis. Pancreas: Unremarkable. No pancreatic ductal dilatation or surrounding inflammatory changes. Spleen: Normal in size  without focal abnormality. Adrenals/Urinary Tract: Mild diffuse bladder wall thickening and perivesicular fat stranding concerning for cystitis. Please correlate with urinalysis. Stable bilateral renal cortical thinning. No abnormal renal enhancement. No urinary tract calculi or obstructive uropathy. The adrenals are stable. Stomach/Bowel: No bowel obstruction or ileus. The appendix is surgically absent. Scattered colonic diverticulosis, most pronounced within the ascending colon, with no evidence of acute diverticulitis. There is no bowel wall thickening or inflammatory change. No CT findings of bowel wall ischemia. Lymphatic: No pathologic adenopathy. Reproductive: Status post hysterectomy. No adnexal masses. Other: No free fluid or free intraperitoneal gas. No abdominal wall hernia. Prior ventral hernia repair. Musculoskeletal: No acute or destructive bony abnormalities. Reconstructed images demonstrate no additional findings. IMPRESSION: VASCULAR 1. Stent within the origin of the superior mesenteric artery, which appears widely patent. 2. 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 3. 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 Two accessory left renal artery supplying the lower pole are widely patent. 4. Chronic right common femoral artery pseudoaneurysm, which has decreased in overall size and developed increased mural thrombus since prior exams. A thin patent neck and minimal central enhancement are identified. If further evaluation is clinically indicated, ultrasound could be performed. 5.  Aortic Atherosclerosis (ICD10-I70.0). NON-VASCULAR 1. Mild diffuse bladder wall thickening and perivesicular fat stranding, compatible with cystitis. Please correlate with urinalysis. 2. Cholelithiasis without cholecystitis. 3. Scattered colonic diverticulosis without diverticulitis. Electronically Signed   By:  Ozell Daring M.D.   On: 03/26/2024 15:47     Nilda Fendt, MD, PhD [facility]  Between 7 am - 7 pm I am available, please contact me via Amion (for emergencies) or Securechat (non urgent messages)  Between 7 pm - 7 am I am not available, please contact night coverage MD/APP via Amion

## 2024-03-27 NOTE — Plan of Care (Signed)
  Problem: Pain Managment: Goal: General experience of comfort will improve and/or be controlled Outcome: Progressing   Problem: Safety: Goal: Ability to remain free from injury will improve Outcome: Progressing

## 2024-03-27 NOTE — Evaluation (Signed)
 Physical Therapy Evaluation Patient Details Name: [name] MRN: [id number] DOB: 1932/01/21 Today's Date: 03/27/2024  History of Present Illness  [name] is a 89 y.o. female admitted 03/26/24 for mesenteric angina. 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 PMHx: HTN, HLD, T2DM, GERD, mesenteric artery stenosis s/p SMA stent placement, and urinary retention with recurrent UTIs.   Clinical Impression  Pt admitted with above diagnosis. PTA, pt was modI for functional mobility using a RW and modI with most ADLs. She reports supervision during bathing and assist with all IADLs. Pt resides at [facility]. Pt currently with functional limitations due to the deficits listed below (see PT Problem List). She performed bed mobility independently, transfers with modI using RW, and gait with supervision using RW. Pt will benefit from acute skilled PT to increase her independence and safety with mobility to allow discharge. Recommend HHPT to increase activity tolerance, decrease pain, improve balance, decrease fall risk, and optimize safety within the home environment.       If plan is discharge home, recommend the following: A little help with walking and/or transfers;A little help with bathing/dressing/bathroom;Assistance with cooking/housework;Assist for transportation;Help with stairs or ramp for entrance   Can travel by private vehicle        Equipment Recommendations None recommended by PT  Recommendations for Other Services       Functional Status Assessment Patient has had a recent decline in their functional status and demonstrates the ability to make significant improvements in function in a reasonable and predictable amount of time.     Precautions / Restrictions Precautions Precautions: Fall Recall of Precautions/Restrictions: Intact Restrictions Weight Bearing Restrictions Per Provider Order: No      Mobility  Bed Mobility Overal bed  mobility: Independent             General bed mobility comments: Pt performed supine>sit with HOB flat, no use of bedrails, or cues for sequencing.    Transfers Overall transfer level: Modified independent Equipment used: Rolling walker (2 wheels)               General transfer comment: Pt performed sit<>stand and bed>chair transfer. She demonstrated proper hand placement using RW. Good eccentric control.    Ambulation/Gait Ambulation/Gait assistance: Supervision Gait Distance (Feet): 150 Feet Assistive device: Rolling walker (2 wheels) Gait Pattern/deviations: Step-through pattern, Decreased stride length Gait velocity: reduced Gait velocity interpretation: 1.31 - 2.62 ft/sec, indicative of limited community ambulator   General Gait Details: Pt ambulated with a reciprocal gait pattern, even weight shift, good foot clearence, and upright posture. She navigated room/hallway well without LOB.  Stairs            Wheelchair Mobility     Tilt Bed    Modified Rankin (Stroke Patients Only)       Balance Overall balance assessment: Mild deficits observed, not formally tested                                           Pertinent Vitals/Pain Pain Assessment Pain Assessment: Faces Faces Pain Scale: Hurts little more Pain Location: Abdomen Pain Descriptors / Indicators: Discomfort, Sore Pain Intervention(s): Monitored during session, Limited activity within patient's tolerance, Repositioned    Home Living Family/patient expects to be discharged to:: Assisted living [facility])  Home Equipment: Rolling Walker (2 wheels) Additional Comments: Pt reports recieveing HH PT/OT 1x/week. She states she hasn't participate in PT d/t BLE pain for the past 2 weeks.    Prior Function Prior Level of Function : Independent/Modified Independent;Needs assist       Physical Assist : ADLs (physical)   ADLs (physical):  Bathing;IADLs Mobility Comments: Ambulates using RW. Denies fall history. ADLs Comments: ModI with most ADLs. Reports supervision from staff while bathing. Staff manage medications, cleaning, and meals.     Extremity/Trunk Assessment   Upper Extremity Assessment Upper Extremity Assessment: Overall WFL for tasks assessed;Right hand dominant    Lower Extremity Assessment Lower Extremity Assessment: Overall WFL for tasks assessed    Cervical / Trunk Assessment Cervical / Trunk Assessment: Normal  Communication   Communication Communication: No apparent difficulties    Cognition Arousal: Alert Behavior During Therapy: WFL for tasks assessed/performed   PT - Cognitive impairments: No apparent impairments                       PT - Cognition Comments: Pt A,Ox4 Following commands: Intact       Cueing Cueing Techniques: Verbal cues     General Comments General comments (skin integrity, edema, etc.): VSS on RA    Exercises     Assessment/Plan    PT Assessment Patient needs continued PT services  PT Problem List Decreased activity tolerance;Decreased balance;Decreased mobility;Pain       PT Treatment Interventions DME instruction;Gait training;Functional mobility training;Therapeutic activities;Therapeutic exercise;Balance training;Patient/family education    PT Goals (Current goals can be found in the Care Plan section)  Acute Rehab PT Goals Patient Stated Goal: Return Home PT Goal Formulation: With patient Time For Goal Achievement: 04/10/24 Potential to Achieve Goals: Good    Frequency Min 2X/week     Co-evaluation               AM-PAC PT 6 Clicks Mobility  Outcome Measure Help needed turning from your back to your side while in a flat bed without using bedrails?: None Help needed moving from lying on your back to sitting on the side of a flat bed without using bedrails?: None Help needed moving to and from a bed to a chair (including a  wheelchair)?: None Help needed standing up from a chair using your arms (e.g., wheelchair or bedside chair)?: None Help needed to walk in hospital room?: A Little Help needed climbing 3-5 steps with a railing? : A Little 6 Click Score: 22    End of Session Equipment Utilized During Treatment: Gait belt Activity Tolerance: Patient tolerated treatment well Patient left: in chair;with call bell/phone within reach;with chair alarm set Nurse Communication: Mobility status;Other (comment) (pt's request for a bath) PT Visit Diagnosis: Other abnormalities of gait and mobility (R26.89)    Time: 9092-9079 PT Time Calculation (min) (ACUTE ONLY): 13 min   Charges:   PT Evaluation $PT Eval Low Complexity: 1 Low   PT General Charges $$ ACUTE PT VISIT: 1 Visit         Randall SAUNDERS, PT, DPT Acute Rehabilitation Services Office: [phone] Secure Chat Preferred  Delon CHRISTELLA Callander 03/27/2024, 9:50 AM

## 2024-03-27 NOTE — Plan of Care (Signed)
  Problem: Clinical Measurements: Goal: Ability to maintain clinical measurements within normal limits will improve Outcome: Progressing   Problem: Pain Managment: Goal: General experience of comfort will improve and/or be controlled Outcome: Progressing   Problem: Safety: Goal: Ability to remain free from injury will improve Outcome: Progressing

## 2024-03-28 ENCOUNTER — Inpatient Hospital Stay (HOSPITAL_COMMUNITY)

## 2024-03-28 DIAGNOSIS — K5730 Diverticulosis of large intestine without perforation or abscess without bleeding: Secondary | ICD-10-CM | POA: Diagnosis not present

## 2024-03-28 DIAGNOSIS — R932 Abnormal findings on diagnostic imaging of liver and biliary tract: Secondary | ICD-10-CM | POA: Diagnosis not present

## 2024-03-28 DIAGNOSIS — K439 Ventral hernia without obstruction or gangrene: Secondary | ICD-10-CM | POA: Diagnosis not present

## 2024-03-28 DIAGNOSIS — K8020 Calculus of gallbladder without cholecystitis without obstruction: Secondary | ICD-10-CM | POA: Diagnosis not present

## 2024-03-28 DIAGNOSIS — K551 Chronic vascular disorders of intestine: Secondary | ICD-10-CM | POA: Diagnosis not present

## 2024-03-28 LAB — COMPREHENSIVE METABOLIC PANEL WITH GFR
ALT: 13 U/L (ref 0–44)
AST: 14 U/L — ABNORMAL LOW (ref 15–41)
Albumin: 3 g/dL — ABNORMAL LOW (ref 3.5–5.0)
Alkaline Phosphatase: 78 U/L (ref 38–126)
Anion gap: 8 (ref 5–15)
BUN: 24 mg/dL — ABNORMAL HIGH (ref 8–23)
CO2: 28 mmol/L (ref 22–32)
Calcium: 9.1 mg/dL (ref 8.9–10.3)
Chloride: 98 mmol/L (ref 98–111)
Creatinine, Ser: 0.97 mg/dL (ref 0.44–1.00)
GFR, Estimated: 56 mL/min — ABNORMAL LOW (ref >60)
Glucose, Bld: 130 mg/dL — ABNORMAL HIGH (ref 70–99)
Potassium: 4.7 mmol/L (ref 3.5–5.1)
Sodium: 134 mmol/L — ABNORMAL LOW (ref 135–145)
Total Bilirubin: 0.6 mg/dL (ref 0.0–1.2)
Total Protein: 5.8 g/dL — ABNORMAL LOW (ref 6.5–8.1)

## 2024-03-28 LAB — URINALYSIS, ROUTINE W REFLEX MICROSCOPIC
Bilirubin Urine: NEGATIVE
Glucose, UA: NEGATIVE mg/dL
Hgb urine dipstick: NEGATIVE
Ketones, ur: NEGATIVE mg/dL
Nitrite: POSITIVE — AB
Protein, ur: 30 mg/dL — AB
RBC / HPF: >50 RBC/hpf (ref 0–5)
Specific Gravity, Urine: 1.009 (ref 1.005–1.030)
WBC, UA: >50 WBC/hpf (ref 0–5)
pH: 6 (ref 5.0–8.0)

## 2024-03-28 LAB — CBC
HCT: 30.7 % — ABNORMAL LOW (ref 36.0–46.0)
Hemoglobin: 10 g/dL — ABNORMAL LOW (ref 12.0–15.0)
MCH: 28 pg (ref 26.0–34.0)
MCHC: 32.6 g/dL (ref 30.0–36.0)
MCV: 86 fL (ref 80.0–100.0)
Platelets: 257 K/uL (ref 150–400)
RBC: 3.57 MIL/uL — ABNORMAL LOW (ref 3.87–5.11)
RDW: 14.1 % (ref 11.5–15.5)
WBC: 5.9 K/uL (ref 4.0–10.5)
nRBC: 0 % (ref 0.0–0.2)

## 2024-03-28 LAB — GLUCOSE, CAPILLARY
Glucose-Capillary: 169 mg/dL — ABNORMAL HIGH (ref 70–99)
Glucose-Capillary: 144 mg/dL — ABNORMAL HIGH (ref 70–99)
Glucose-Capillary: 159 mg/dL — ABNORMAL HIGH (ref 70–99)
Glucose-Capillary: 110 mg/dL — ABNORMAL HIGH (ref 70–99)
Glucose-Capillary: 153 mg/dL — ABNORMAL HIGH (ref 70–99)

## 2024-03-28 LAB — PROTIME-INR
INR: 1 (ref 0.8–1.2)
Prothrombin Time: 14.1 s (ref 11.4–15.2)

## 2024-03-28 LAB — MAGNESIUM: Magnesium: 1.9 mg/dL (ref 1.7–2.4)

## 2024-03-28 MED ORDER — FOSFOMYCIN TROMETHAMINE 3 G PO PACK
3.0000 g | PACK | Freq: Once | ORAL | Status: AC
  Administered 2024-03-28: 3 g via ORAL
  Filled 2024-03-28: qty 3

## 2024-03-28 NOTE — Consult Note (Signed)
 Chief Complaint:  Abdominal Pain  Procedure: Angiogram with possible angioplasty, possible stent placement  Referring Provider(s): Dr. Nilda Fendt  Supervising Physician: Luverne Aran  Patient Status: [facility] - In-pt  History of Present Illness: [name] is a pleasant 89 y.o. female with a history of HLD, T2DM, and mesenteric artery stenosis s/p stent placement in 04/2023 with Dr. JONETTA Sides. She presented to the ED on 9/26 with complaints of worsening abdominal pain for the previous 4 days. She reports being intermittently nauseous and having increased abdominal pain ~40 minutes after eating. CTA Abdomen on 9/26 revealed widely patent superior mesenteric artery and stable high-grade stenosis at the origin of the celiac artery, estimated 70-90%. IR consulted for angiogram with possible intervention. Case and images reviewed and approved by Dr. KANDICE Luverne.   Patient resting in bed. States that apart from her abdominal pain she has been feeling very fatigued. She continues to have intermittent nausea without vomiting and abdominal pain associated with during and after eating. She denies any hematemesis, fevers/chills, chest pain, or shortness of breath. All questions and concerns answered at the bedside.   Patient is to remain DNR-Limited for the procedure  Past Medical History:  Diagnosis Date   Blood transfusion without reported diagnosis    Cognitive dysfunction 03/04/2019   Patient scores 21 out of 30 on [place] cognitive assessment September 2020   Diabetes mellitus without complication (HCC)    Diabetic peripheral neuropathy associated with type 2 diabetes mellitus (HCC) 02/25/2022   Diverticulitis    Frailty 03/04/2019   H/O bilateral breast reduction surgery    Hyperlipidemia    a. intolerant to statins.    Hypertension     Past Surgical History:  Procedure Laterality Date   ABDOMINAL HYSTERECTOMY  2003   APPENDECTOMY     age 22   BIOPSY  04/10/2023    Procedure: BIOPSY;  Surgeon: Cinderella Deatrice FALCON, MD;  Location: [facility];  Service: Endoscopy;;   BREAST REDUCTION SURGERY     age 66   COLON SURGERY Left 2011   Hemicolectomy due to diverticulitis   ESOPHAGOGASTRODUODENOSCOPY (EGD) WITH PROPOFOL  N/A 04/10/2023   Procedure: ESOPHAGOGASTRODUODENOSCOPY (EGD) WITH PROPOFOL ;  Surgeon: Cinderella Deatrice FALCON, MD;  Location: [facility];  Service: Endoscopy;  Laterality: N/A;   EYE SURGERY  03/30/2009   cataract   IR ANGIOGRAM VISCERAL SELECTIVE  04/11/2023   IR INTRAVASCULAR ULTRASOUND NON CORONARY  04/11/2023   IR TRANSCATH PLC STENT 1ST ART NOT LE CV CAR VERT CAR  04/11/2023   IR US  GUIDE VASC ACCESS RIGHT  04/11/2023   KNEE SURGERY Right 2003   knee cap   LAPAROSCOPIC INCISIONAL / UMBILICAL / VENTRAL HERNIA REPAIR  02/23/2007   REDUCTION MAMMAPLASTY Bilateral 2001   REFRACTIVE SURGERY     TOTAL KNEE ARTHROPLASTY Right 04/10/2022   Procedure: TOTAL KNEE ARTHROPLASTY;  Surgeon: Gerome Charleston, MD;  Location: [facility];  Service: Orthopedics;  Laterality: Right;  adductor canal    Allergies: Statins, Phenergan [promethazine hcl], Hydrocortisone, Lisinopril, Prednisone , and Trazodone  and nefazodone  Medications: Prior to Admission medications   Medication Sig Start Date End Date Taking? Authorizing Provider  acetaminophen  (TYLENOL ) 325 MG tablet TAKE (2) TABLETS BY MOUTH EVERY SIX HOURS AS NEEDED FOR PAIN. MAX 3GM IN 24 HOURS. 07/28/23  Yes [name], MD  albuterol  (VENTOLIN  HFA) 108 (90 Base) MCG/ACT inhaler INHALE 2 PUFFS INTO THE LUNGS EVERY SIX HOURS AS NEEDED FOR WHEEZING OR SHORTNESS OF BREATH. 09/26/23  Yes Luking, Glendia LABOR, MD  ALPRAZolam  (XANAX ) 0.25 MG tablet TAKE (1) TABLET BY MOUTH AT BEDTIME FOR SLEEP. 02/25/24  Yes Luking, Glendia LABOR, MD  amLODipine  (NORVASC ) 10 MG tablet TAKE (1) TABLET BY MOUTH ONCE DAILY. 10/07/23  Yes Luking, Glendia LABOR, MD  aspirin  (ASPIRIN  LOW DOSE) 81 MG chewable tablet CHEW (1) TABLET BY MOUTH ONCE DAILY.  09/06/23  Yes [name], MD  BETA CAROTENE PROVITAMIN A 25000 units capsule TAKE (1) CAPSULE BY MOUTH AT BEDTIME. 02/24/24  Yes Luking, Scott A, MD  BISACODYL  5 MG EC tablet TAKE 1 TABLET BY MOUTH ONCE DAILY AS NEEDED FOR MODERATE CONSTIPATION. 12/20/22  Yes [name], MD  cyanocobalamin  1000 MCG tablet Take 1,000 mcg by mouth at bedtime.   Yes [provider]  cycloSPORINE  (RESTASIS ) 0.05 % ophthalmic emulsion Place 1 drop into both eyes 2 (two) times daily.   Yes [provider]  estradiol  (ESTRACE ) 0.1 MG/GM vaginal cream Discard plastic applicator. Insert a blueberry size amount (approximately 1 gram) of cream on fingertip inside vagina at bedtime every night for 1 week then every other night. For long term use. 01/15/24  Yes Larocco, Lauraine BROCKS, FNP  gabapentin  (NEURONTIN ) 100 MG capsule 1 nightly for neuropathy Patient taking differently: Take 100 mg by mouth in the morning. 03/08/24  Yes [name], MD  hydrALAZINE  (APRESOLINE ) 25 MG tablet Take 1 tablet (25 mg total) by mouth 3 (three) times daily. 12/31/23  Yes Luking, Glendia LABOR, MD  IVIZIA DRY EYES 0.5 % SOLN Place 1 drop into both eyes 2 (two) times daily. 09/22/23  Yes [provider]  Melatonin 10 MG TABS Take 10 mg by mouth at bedtime. 03/24/24  Yes Grooms, Courtney, PA-C  METAMUCIL 4 IN 1 FIBER 25 % PACK Take by mouth 2 (two) times daily. 03/25/24  Yes [provider]  metFORMIN  (GLUCOPHAGE ) 500 MG tablet TAKE (1) TABLET BY MOUTH IN THE MORNING & (1/2) TABLET (250MG ) BY MOUTH AT SUPPER 10/07/23  Yes Luking, Glendia LABOR, MD  methenamine  (HIPREX ) 1 g tablet Take 1 tablet (1 g total) by mouth 2 (two) times daily with a meal. Most effective when taken with a daily Vitamin C supplement. 01/15/24  Yes Gerldine Lauraine BROCKS, FNP  Multiple Vitamins-Minerals (PRESERVISION AREDS 2) CAPS Take 1 capsule by mouth in the morning and at bedtime.   Yes [provider]  mupirocin  ointment (BACTROBAN ) 2 % Apply thin  amount twice daily to rash in groin creases twice daily for 5 days at a time when rash occurs 03/07/24  Yes Luking, Glendia LABOR, MD  ondansetron  (ZOFRAN -ODT) 4 MG disintegrating tablet 4mg  ODT q4 hours prn nausea/vomit 09/05/23  Yes Zammit, Joseph, MD  pantoprazole  (PROTONIX ) 40 MG tablet Take 1 tablet (40 mg total) by mouth 2 (two) times daily before a meal. 12/24/23  Yes Cook, Jayce G, DO  polyethylene glycol powder (GOODSENSE CLEARLAX) 17 GM/SCOOP powder 1 scoop in 8 ounces water  once daily as needed for constipation 06/03/23  Yes Luking, Scott A, MD  sodium chloride  1 g tablet TAKE (1) TABLET BY MOUTH TWICE DAILY WITH A MEAL. 01/26/24  Yes Luking, Scott A, MD  tamsulosin  (FLOMAX ) 0.4 MG CAPS capsule Take 1 capsule (0.4 mg total) by mouth daily. 03/08/24  Yes Dahlstedt, Garnette, MD  valsartan  (DIOVAN ) 320 MG tablet Take 1 tablet (320 mg total) by mouth daily. 12/22/23  Yes Luking, Glendia LABOR, MD  Vitamin D , Cholecalciferol , 10 MCG (400 UNIT) TABS TAKE (1)  TABLET BY MOUTH ONCE DAILY. 10/07/23  Yes Luking, Scott A, MD  glucose blood (EASYMAX TEST) test strip CHECK BLOOD SUGAR ONCE DAILY.(CALL MD IF BS BELOW 60: OR IF BS ABOVE 400) 10/07/23   Luking, Glendia LABOR, MD  ketoconazole  (NIZORAL ) 2 % cream Apply 1 Application topically 2 (two) times daily. For 10 days to groin rash Patient not taking: Reported on 03/26/2024 03/03/24   [name], MD  Safety Lancets 28G MISC CHECK BLOOD SUGAR ONCE DAILY. 01/21/24   [name], MD     Family History  Problem Relation Age of Onset   Cancer Mother        ovaian   Hypertension Father        kidney   Stroke Father    Cancer Father    Hypertension Sister    Diabetes Brother     Social History   Socioeconomic History   Marital status: Widowed    Spouse name: Not on file   Number of children: Not on file   Years of education: Not on file   Highest education level: Not on file  Occupational History   Not on file  Tobacco Use   Smoking status: Never   Smokeless  tobacco: Never  Vaping Use   Vaping status: Never Used  Substance and Sexual Activity   Alcohol  use: No   Drug use: No   Sexual activity: Never    Birth control/protection: Abstinence  Other Topics Concern   Not on file  Social History Narrative   Not on file   Social Drivers of Health   Financial Resource Strain: Low Risk  (09/06/2022)   Overall Financial Resource Strain (CARDIA)    Difficulty of Paying Living Expenses: Not hard at all  Food Insecurity: No Food Insecurity (03/26/2024)   Hunger Vital Sign    Worried About Running Out of Food in the Last Year: Never true    Ran Out of Food in the Last Year: Never true  Transportation Needs: No Transportation Needs (03/26/2024)   PRAPARE - Administrator, Civil Service (Medical): No    Lack of Transportation (Non-Medical): No  Physical Activity: Sufficiently Active (09/06/2022)   Exercise Vital Sign    Days of Exercise per Week: 5 days    Minutes of Exercise per Session: 30 min  Stress: No Stress Concern Present (09/06/2022)   [employer] of Occupational Health - Occupational Stress Questionnaire    Feeling of Stress : Not at all  Social Connections: Moderately Isolated (03/26/2024)   Social Connection and Isolation Panel    Frequency of Communication with Friends and Family: More than three times a week    Frequency of Social Gatherings with Friends and Family: More than three times a week    Attends Religious Services: 1 to 4 times per year    Active Member of [employer] or Organizations: No    Attends Banker Meetings: Never    Marital Status: Widowed     Review of Systems  Gastrointestinal:  Positive for abdominal pain (worse after eating, sometimes while eating) and nausea.  Patient denies any headache, chest pain, shortness of breath, vomiting, hematemesis, blood in stool, or fever/chills. All other systems are negative.   Vital Signs: BP (!) 182/61 (BP Location: Right Arm)   Pulse 89   Temp 97.6  F (36.4 C) (Oral)   Resp 18   Ht 5' 8 (1.727 m)   Wt 174 lb 2.6 oz (79 kg)  SpO2 100%   BMI 26.48 kg/m   Advance Care Plan: The advanced care plan/surrogate decision maker was discussed at the time of visit and documented in the medical record.    Physical Exam Constitutional:      Appearance: Normal appearance.  HENT:     Mouth/Throat:     Mouth: Mucous membranes are moist.     Pharynx: Oropharynx is clear.  Cardiovascular:     Rate and Rhythm: Normal rate and regular rhythm.     Heart sounds: Normal heart sounds.  Pulmonary:     Effort: Pulmonary effort is normal.     Breath sounds: Normal breath sounds.  Abdominal:     General: Abdomen is flat.     Palpations: Abdomen is soft.     Tenderness: There is abdominal tenderness (throughout, but left greater than right). There is guarding.  Skin:    General: Skin is warm and dry.  Neurological:     General: No focal deficit present.     Mental Status: She is alert and oriented to person, place, and time.  Psychiatric:        Behavior: Behavior normal.     Imaging: CT ABDOMEN PELVIS WO CONTRAST Result Date: 03/28/2024 EXAM: CT ABDOMEN AND PELVIS WITHOUT CONTRAST 03/28/2024 10:10:16 AM TECHNIQUE: CT of the abdomen and pelvis was performed without the administration of intravenous contrast. Multiplanar reformatted images are provided for review. Automated exposure control, iterative reconstruction, and/or weight-based adjustment of the mA/kV was utilized to reduce the radiation dose to as low as reasonably achievable. COMPARISON: CT angio abdomen and pelvis with contrast 03/26/2024. CLINICAL HISTORY: Abdominal pain, acute, nonlocalized; worsening abdominal pain. FINDINGS: LOWER CHEST: No acute abnormality. LIVER: A small cyst or hemangioma near the dome of the liver is stable. GALLBLADDER AND BILE DUCTS: Multiple gallstones are again noted at the neck of the gallbladder. No inflammatory changes or associated. No biliary ductal  dilatation. SPLEEN: No acute abnormality. PANCREAS: No acute abnormality. ADRENAL GLANDS: No acute abnormality. KIDNEYS, URETERS AND BLADDER: No stones in the kidneys or ureters. No hydronephrosis. No perinephric or periureteral stranding. Mild bladder wall thickening is less prominent than on the previous study. GI AND BOWEL: Stomach demonstrates no acute abnormality. Diverticular changes are again noted throughout the descending and sigmoid colon without focal inflammation to suggest diverticulitis. There is no bowel obstruction. PERITONEUM AND RETROPERITONEUM: No ascites. No free air. VASCULATURE: Extensive atherosclerotic changes are again noted in the aorta. Stent at the origin of the SMA is noted. LYMPH NODES: No lymphadenopathy. REPRODUCTIVE ORGANS: No acute abnormality. BONES AND SOFT TISSUES: Ventral hernia repair. No acute osseous abnormality. No focal soft tissue abnormality. IMPRESSION: 1. No acute findings. 2. Multiple gallstones at the neck of the gallbladder without inflammatory changes. 3. Diverticular changes in the descending and sigmoid colon without evidence of diverticulitis. Electronically signed by: Lonni Necessary MD 03/28/2024 10:47 AM EDT RP Workstation: [id number]   CT Angio Abd/Pel W and/or Wo Contrast Result Date: 03/26/2024 CLINICAL DATA:  Epigastric pain radiating to lower abdomen for 3-4 days, progressive worsening, nausea, history of mesenteric artery stenosis EXAM: CTA ABDOMEN AND PELVIS WITHOUT AND WITH CONTRAST TECHNIQUE: Multidetector CT imaging of the abdomen and pelvis was performed using the standard protocol during bolus administration of intravenous contrast. Multiplanar reconstructed images and MIPs were obtained and reviewed to evaluate the vascular anatomy. RADIATION DOSE REDUCTION: This exam was performed according to the departmental dose-optimization program which includes automated exposure control, adjustment of the mA and/or kV according to  patient size  and/or use of iterative reconstruction technique. CONTRAST:  80mL OMNIPAQUE  IOHEXOL  350 MG/ML SOLN COMPARISON:  03/09/2024, 02/24/2024 FINDINGS: VASCULAR Aorta: Normal caliber aorta without aneurysm, dissection, vasculitis or significant stenosis. Diffuse atherosclerosis unchanged. 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 Distal branches of the celiac are patent, with no evidence of aneurysm, dissection, or vasculitis. SMA: There is a stent at the origin of the SMA, which appears widely patent. Distal branches of the SMA are normal, with no evidence of aneurysm, dissection, or vasculitis. Renals: There is significant stenosis at the origin of the bilateral main renal arteries, estimated 70-90% stenosis on the right and 50-70% stenosis on the left. There are 2 additional accessory left renal arteries arising from the aorta, supplying the lower pole left kidney, which appear widely patent throughout their courses. No evidence of aneurysm, dissection, vasculitis, or fibromuscular dysplasia. IMA: The IMA is not severe stenosis at the origin of the IMA, though the distal branches are normal caliber and widely patent, likely via collateral flow. No evidence of aneurysm, dissection, or vasculitis. Inflow: Patent without evidence of aneurysm, dissection, vasculitis or significant stenosis. Stable diffuse atherosclerosis. Proximal Outflow: Pseudoaneurysm of the right common femoral artery is again noted, measuring approximately 2.3 x 2.2 cm, previously measuring 2.2 x 3.0 cm on the 09/22/2023 exam. Significant mural thrombus has developed within the pseudoaneurysm since the prior exams, with a thin patent neck and minimal central flow identified on this study. If further evaluation is clinically indicated, ultrasound could be performed. The bilateral common femoral, superficial femoral, and profundus femoral arteries are patent. No dissection, vasculitis, or critical  stenosis. Diffuse atherosclerosis unchanged. Veins: No obvious venous abnormality within the limitations of this arterial phase study. Review of the MIP images confirms the above findings. NON-VASCULAR Lower chest: No acute pleural or parenchymal lung disease. Hepatobiliary: Calcified gallstones without cholecystitis. Unremarkable appearance of the liver. No biliary duct dilation or choledocholithiasis. Pancreas: Unremarkable. No pancreatic ductal dilatation or surrounding inflammatory changes. Spleen: Normal in size without focal abnormality. Adrenals/Urinary Tract: Mild diffuse bladder wall thickening and perivesicular fat stranding concerning for cystitis. Please correlate with urinalysis. Stable bilateral renal cortical thinning. No abnormal renal enhancement. No urinary tract calculi or obstructive uropathy. The adrenals are stable. Stomach/Bowel: No bowel obstruction or ileus. The appendix is surgically absent. Scattered colonic diverticulosis, most pronounced within the ascending colon, with no evidence of acute diverticulitis. There is no bowel wall thickening or inflammatory change. No CT findings of bowel wall ischemia. Lymphatic: No pathologic adenopathy. Reproductive: Status post hysterectomy. No adnexal masses. Other: No free fluid or free intraperitoneal gas. No abdominal wall hernia. Prior ventral hernia repair. Musculoskeletal: No acute or destructive bony abnormalities. Reconstructed images demonstrate no additional findings. IMPRESSION: VASCULAR 1. Stent within the origin of the superior mesenteric artery, which appears widely patent. 2. 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 3. 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 Two accessory left renal artery supplying the lower pole are widely patent. 4. Chronic right common femoral artery pseudoaneurysm, which has decreased in overall  size and developed increased mural thrombus since prior exams. A thin patent neck and minimal central enhancement are identified. If further evaluation is clinically indicated, ultrasound could be performed. 5.  Aortic Atherosclerosis (ICD10-I70.0). NON-VASCULAR 1. Mild diffuse bladder wall thickening and perivesicular fat stranding, compatible with cystitis. Please correlate with urinalysis. 2.  Cholelithiasis without cholecystitis. 3. Scattered colonic diverticulosis without diverticulitis. Electronically Signed   By: Ozell Daring M.D.   On: 03/26/2024 15:47   US  MESENTERIC ARTERIES Result Date: 03/17/2024 CLINICAL DATA:  Mesenteric artery stenosis. Status post stenting of superior mesenteric artery. EXAM: US  MESENTERIC ARTERIAL DOPPLER COMPARISON:  CT abdomen and pelvis 02/24/2024 and visceral angiogram 04/11/2023 FINDINGS: Celiac axis: 275 cm/sec Celiac axis with inspiration: 221 cm/sec Celiac axis with expiration: 291 cm/sec Splenic artery: 250 cm/sec Hepatic artery: 202 cm/sec SMA: Proximal 299 cm/sec; mid 359 cm/sec; distal 181 cm/sec IMA: 231 cm/sec Aorta: 128 cm/sec Aortic size: 1.9 cm proximally, 1.5 cm in the mid segment and 1.3 cm distally IMPRESSION: 1. SMA is patent. Limited evaluation of the stent. Elevated velocities in the proximal SMA raises concern for intra stent stenosis. 2. Elevated peak systolic velocity in the celiac artery is suggestive for greater than 70% stenosis. Electronically Signed   By: Juliene Balder M.D.   On: 03/17/2024 09:05   DG Abd 2 Views Result Date: 03/17/2024 CLINICAL DATA:  History of recent C difficile infection, occasional diarrhea EXAM: ABDOMEN - 2 VIEW COMPARISON:  02/24/2027 FINDINGS: Scattered large and small bowel gas is noted. Multiple gallstones are noted in the right upper quadrant. No free air is seen. Mild retained fecal material is noted within the right colon. No constipation is seen. Changes of prior anterior abdominal wall surgery are noted. IMPRESSION:  Mild retained fecal material in the right colon. Cholelithiasis. Electronically Signed   By: Oneil Devonshire M.D.   On: 03/17/2024 03:54    Labs:  CBC: Recent Labs    02/10/24 1700 02/24/24 1850 03/26/24 1326 03/28/24 0301  WBC 7.1 8.6 7.1 5.9  HGB 12.1 10.8* 10.6* 10.0*  HCT 36.8 34.1* 34.0* 30.7*  PLT 295 285 251 257    COAGS: Recent Labs    04/11/23 0850  INR 1.1    BMP: Recent Labs    02/10/24 1700 02/24/24 1850 03/26/24 1326 03/28/24 0301  NA 133* 135 136 134*  K 4.1 4.3 3.9 4.7  CL 96* 98 97* 98  CO2 24 26 24 28   GLUCOSE 144* 138* 128* 130*  BUN 13 16 15  24*  CALCIUM 9.4 9.3 9.9 9.1  CREATININE 0.78 0.77 0.78 0.97  GFRNONAA >60 >60 >60 56*    LIVER FUNCTION TESTS: Recent Labs    02/10/24 1700 02/24/24 1850 03/26/24 1326 03/28/24 0301  BILITOT 0.9 0.8 1.0 0.6  AST 19 19 18  14*  ALT 13 13 15 13   ALKPHOS 98 90 93 78  PROT 7.0 6.5 7.2 5.8*  ALBUMIN 3.9 3.6 4.0 3.0*    TUMOR MARKERS: No results for input(s): AFPTM, CEA, CA199, CHROMGRNA in the last 8760 hours.  Assessment and Plan:  Abdominal Pain w/ celiac artery stenosis: [name] is a 89 y.o. female with a history of SMA stenosis s/p stent placement in 04/2023. She recently presented to the ED with worsening abdominal pain for the past several days. Imaging concerning for possible celiac artery stenosis. IR consulted for angiogram with possible intervention including angioplasty vs stent placement. Procedure to be performed under moderate sedation.  -NPO at [facility] -INR pending -On ASA and Plavix , can remain on these for procedure -Lovenox  held -Plan discussed with daughter-in-law, Stephane, on the phone as well, per patient's request -Patient also requesting to be sent home with Hospice. Medicine team aware and has plans to set this up for patient prior to discharge -Will plan for tentative angiogram with  possible intervention on 9/29  Risks and benefits of angiogram with possible  intervention were discussed with the patient including, but not limited to bleeding, infection, vascular injury or contrast induced renal failure.  This interventional procedure involves the use of X-rays and because of the nature of the planned procedure, it is possible that we will have prolonged use of X-ray fluoroscopy.  Potential radiation risks to you include (but are not limited to) the following: - A slightly elevated risk for cancer  several years later in life. This risk is typically less than 0.5% percent. This risk is low in comparison to the normal incidence of human cancer, which is 33% for women and 50% for men according to the [employer] Society. - Radiation induced injury can include skin redness, resembling a rash, tissue breakdown / ulcers and hair loss (which can be temporary or permanent).   The likelihood of either of these occurring depends on the difficulty of the procedure and whether you are sensitive to radiation due to previous procedures, disease, or genetic conditions.   IF your procedure requires a prolonged use of radiation, you will be notified and given written instructions for further action.  It is your responsibility to monitor the irradiated area for the 2 weeks following the procedure and to notify your physician if you are concerned that you have suffered a radiation induced injury.    All of the patient's questions were answered, patient is agreeable to proceed.  Consent signed and in chart.   Thank you for allowing our service to participate in [name] 's care.    Electronically Signed: Glennon CHRISTELLA Bal, PA-C   03/28/2024, 11:54 AM     I spent a total of 40 Minutes in face to face in clinical consultation, greater than 50% of which was counseling/coordinating care for mesenteric angiogram with possible angioplasty vs stent placement.

## 2024-03-28 NOTE — Progress Notes (Addendum)
 PROGRESS NOTE  [name] FMW:[id number] DOB: [birth year] DOA: 03/26/2024 PCP: [name], MD   LOS: 2 days   Brief Narrative / Interim history: 89 y.o. female, with medical history significant of hypertension, hyperlipidemia, T2DM, GERD mesenteric artery stenosis and stent placement, history of urinary retention with recurrent UTIs, she lives at ALF, came into the hospital with abdominal pain, nausea, poor appetite.  She has a history of mesenteric ischemia with prior SMA stent.  He has a history of mesenteric underwent a CT angiogram which showed patent SMA stent, however celiac artery stenosis.  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  Admitting MD discussed with IR Dr. Luverne, discussed with IR team PA this morning, appreciate input - Continue liquid diet for now, continue aspirin  and Plavix  - With worsening pain this morning, will do a quick scan  Active problems UTI-urinary dysuria worsening today, she has a history of C. difficile, will resend a UA but given symptoms and urinalysis while in the ED would favor to treat.  Fosfomycin x 1  DM2-controlled, A1c was 6.3 just recently.  Keep on sliding scale.  Hold metformin   Essential hypertension-continue amlodipine , hydralazine , hold Avapro .  Blood pressure on the high side due to pain this morning, will receive morphine   Generalized anxiety disorder-continue Xanax   Scheduled Meds:  amLODipine   10 mg Oral Daily   artificial tears  1 drop Both Eyes BID   aspirin   81 mg Oral Daily   clopidogrel   75 mg Oral Daily   cyanocobalamin    1,000 mcg Oral QHS   cycloSPORINE   1 drop Both Eyes BID   docusate sodium   100 mg Oral BID   enoxaparin  (LOVENOX ) injection  40 mg Subcutaneous Q24H   estradiol   1 Applicatorful Vaginal QHS   feeding supplement (GLUCERNA SHAKE)  237 mL Oral TID BM   gabapentin   100 mg Oral q AM   hydrALAZINE   25 mg Oral TID   insulin  aspart  0-9 Units Subcutaneous TID WC   melatonin  9 mg Oral QHS   methenamine   1 g Oral BID WC   multivitamin  1 tablet Oral Daily   pantoprazole   40 mg Oral BID AC   tamsulosin   0.4 mg Oral Daily   Continuous Infusions:   PRN Meds:.acetaminophen  **OR** acetaminophen , albuterol , ALPRAZolam , bisacodyl , hydrALAZINE , morphine  injection, ondansetron  **OR** ondansetron  (ZOFRAN ) IV, polyethylene glycol, vitamin A & D  Current Outpatient Medications  Medication Instructions   acetaminophen  (TYLENOL ) 325 MG tablet TAKE (2) TABLETS BY MOUTH EVERY SIX HOURS AS NEEDED FOR PAIN. MAX 3GM IN 24 HOURS.   albuterol  (VENTOLIN  HFA) 108 (90 Base) MCG/ACT inhaler 2 puffs, Inhalation, Every 6 hours PRN   ALPRAZolam  (XANAX ) 0.25 MG tablet TAKE (1) TABLET BY MOUTH AT BEDTIME FOR SLEEP.   amLODipine  (NORVASC ) 10 MG tablet TAKE (1) TABLET BY MOUTH ONCE DAILY.   aspirin  (ASPIRIN  LOW DOSE) 81 MG chewable tablet CHEW (1) TABLET BY MOUTH ONCE DAILY.   BETA CAROTENE PROVITAMIN A 25000 units capsule TAKE (1) CAPSULE BY MOUTH AT BEDTIME.   BISACODYL  5 MG EC tablet TAKE 1 TABLET BY MOUTH ONCE DAILY AS NEEDED FOR MODERATE  CONSTIPATION.   cyanocobalamin  1,000 mcg, Daily at bedtime   cycloSPORINE  (RESTASIS ) 0.05 % ophthalmic emulsion 1 drop, 2 times daily   estradiol  (ESTRACE ) 0.1 MG/GM vaginal cream Discard plastic applicator. Insert a blueberry size amount (approximately 1 gram) of cream on fingertip inside vagina at bedtime every night for 1 week then every other night. For long term use.   gabapentin  (NEURONTIN ) 100 MG capsule 1 nightly for neuropathy   glucose blood (EASYMAX TEST) test strip CHECK  BLOOD SUGAR ONCE DAILY.(CALL MD IF BS BELOW 60: OR IF BS ABOVE 400)   hydrALAZINE  (APRESOLINE ) 25 mg, Oral, 3 times daily   IVIZIA DRY EYES 0.5 % SOLN 1 drop, 2 times daily   ketoconazole  (NIZORAL ) 2 % cream 1 Application, Topical, 2 times daily, For 10 days to groin rash   Melatonin 10 mg, Oral, Daily at bedtime   METAMUCIL 4 IN 1 FIBER 25 % PACK Oral, 2 times daily   metFORMIN  (GLUCOPHAGE ) 500 MG tablet TAKE (1) TABLET BY MOUTH IN THE MORNING & (1/2) TABLET (250MG ) BY MOUTH AT SUPPER   methenamine  (HIPREX ) 1 g, Oral, 2 times daily with meals, Most effective when taken with a daily Vitamin C supplement.   Multiple Vitamins-Minerals (PRESERVISION AREDS 2) CAPS 1 capsule, 2 times daily   mupirocin  ointment (BACTROBAN ) 2 % Apply thin amount twice daily to rash in groin creases twice daily for 5 days at a time when rash occurs   ondansetron  (ZOFRAN -ODT) 4 MG disintegrating tablet 4mg  ODT q4 hours prn nausea/vomit   pantoprazole  (PROTONIX ) 40 mg, Oral, 2 times daily before meals   polyethylene glycol powder (GOODSENSE CLEARLAX) 17 GM/SCOOP powder 1 scoop in 8 ounces water  once daily as needed for constipation   Safety Lancets 28G MISC CHECK BLOOD SUGAR ONCE DAILY.   sodium chloride  1 g tablet TAKE (1) TABLET BY MOUTH TWICE DAILY WITH A MEAL.   tamsulosin  (FLOMAX ) 0.4 mg, Oral, Daily   valsartan  (DIOVAN ) 320 mg, Oral, Daily   Vitamin D , Cholecalciferol , 10 MCG (400 UNIT) TABS TAKE (1) TABLET BY MOUTH ONCE DAILY.    Diet Orders (From admission, onward)     Start     Ordered   03/26/24 1925  Diet full liquid Room service appropriate? Yes; Fluid consistency: Thin  Diet effective now       Question Answer Comment  Room service appropriate? Yes   Fluid consistency: Thin      03/26/24 1924            DVT prophylaxis: enoxaparin  (LOVENOX ) injection 40 mg Start: 03/26/24 2200 SCDs Start: 03/26/24 1925   Lab Results  Component Value Date   PLT 257 03/28/2024      Code Status: Limited:  Do not attempt resuscitation (DNR) -DNR-LIMITED -Do Not Intubate/DNI   Family Communication: No family at bedside  Status is: Inpatient Remains inpatient appropriate because: Severity of illness  Level of care: Telemetry Medical  Consultants:  Interventional radiology  Objective: Vitals:   03/27/24 2059 03/27/24 2108 03/28/24 0518 03/28/24 0800  BP: (!) 145/57 (!) 145/57 (!) 156/57 (!) 182/61  Pulse: 68  66 89  Resp: 18  18   Temp: 97.7 F (36.5 C)  97.8 F (36.6 C) 97.6 F (36.4 C)  TempSrc: Oral  Oral Oral  SpO2: 96%  96% 100%  Weight:      Height:        Intake/Output Summary (Last 24 hours) at 03/28/2024 0959 Last data filed at 03/27/2024 1900 Gross per 24  hour  Intake 240 ml  Output --  Net 240 ml   Wt Readings from Last 3 Encounters:  03/26/24 79 kg  03/24/24 79.8 kg  03/03/24 78.5 kg    Examination:  Constitutional: NAD Eyes: lids and conjunctivae normal, no scleral icterus ENMT: mmm Neck: normal, supple Respiratory: clear to auscultation bilaterally, no wheezing, no crackles. Cardiovascular: Regular rate and rhythm, no murmurs / rubs / gallops. No LE edema. Abdomen: Tender mainly in the lower areas, no guarding or rebound.  Bowel sounds positive Skin: no rashes Neurologic: no focal deficits, equal strength  Data Reviewed: I have independently reviewed following labs and imaging studies   CBC Recent Labs  Lab 03/26/24 1326 03/28/24 0301  WBC 7.1 5.9  HGB 10.6* 10.0*  HCT 34.0* 30.7*  PLT 251 257  MCV 87.6 86.0  MCH 27.3 28.0  MCHC 31.2 32.6  RDW 14.1 14.1  LYMPHSABS 1.8  --   MONOABS 0.7  --   EOSABS 0.1  --   BASOSABS 0.0  --     Recent Labs  Lab 03/26/24 1326 03/28/24 0301  NA 136 134*  K 3.9 4.7  CL 97* 98  CO2 24 28  GLUCOSE 128* 130*  BUN 15 24*  CREATININE 0.78 0.97  CALCIUM 9.9 9.1  AST 18 14*  ALT 15 13  ALKPHOS 93 78  BILITOT 1.0 0.6  ALBUMIN 4.0 3.0*  MG  --  1.9     ------------------------------------------------------------------------------------------------------------------ No results for input(s): CHOL, HDL, LDLCALC, TRIG, CHOLHDL, LDLDIRECT in the last 72 hours.  Lab Results  Component Value Date   HGBA1C 6.3 (H) 12/26/2023   ------------------------------------------------------------------------------------------------------------------ No results for input(s): TSH, T4TOTAL, T3FREE, THYROIDAB in the last 72 hours.  Invalid input(s): FREET3  Cardiac Enzymes No results for input(s): CKMB, TROPONINI, MYOGLOBIN in the last 168 hours.  Invalid input(s): CK ------------------------------------------------------------------------------------------------------------------    Component Value Date/Time   BNP 88.0 09/22/2023 1848    CBG: Recent Labs  Lab 03/27/24 0818 03/27/24 1205 03/27/24 1547 03/27/24 2057 03/28/24 0731  GLUCAP 121* 104* 147* 116* 169*    No results found for this or any previous visit (from the past 240 hours).   Radiology Studies: No results found.    Nilda Fendt, MD, PhD [facility]  Between 7 am - 7 pm I am available, please contact me via Amion (for emergencies) or Securechat (non urgent messages)  Between 7 pm - 7 am I am not available, please contact night coverage MD/APP via Amion

## 2024-03-28 NOTE — Progress Notes (Signed)
 Collected urine specimen on patient for UA and sent to lab.

## 2024-03-29 DIAGNOSIS — Z66 Do not resuscitate: Secondary | ICD-10-CM | POA: Diagnosis not present

## 2024-03-29 DIAGNOSIS — Z515 Encounter for palliative care: Secondary | ICD-10-CM

## 2024-03-29 DIAGNOSIS — G8929 Other chronic pain: Secondary | ICD-10-CM

## 2024-03-29 DIAGNOSIS — R1013 Epigastric pain: Secondary | ICD-10-CM | POA: Diagnosis not present

## 2024-03-29 DIAGNOSIS — K551 Chronic vascular disorders of intestine: Secondary | ICD-10-CM | POA: Diagnosis not present

## 2024-03-29 LAB — COMPREHENSIVE METABOLIC PANEL WITH GFR
ALT: 12 U/L (ref 0–44)
AST: 15 U/L (ref 15–41)
Albumin: 3 g/dL — ABNORMAL LOW (ref 3.5–5.0)
Alkaline Phosphatase: 68 U/L (ref 38–126)
Anion gap: 10 (ref 5–15)
BUN: 23 mg/dL (ref 8–23)
CO2: 26 mmol/L (ref 22–32)
Calcium: 8.8 mg/dL — ABNORMAL LOW (ref 8.9–10.3)
Chloride: 98 mmol/L (ref 98–111)
Creatinine, Ser: 0.95 mg/dL (ref 0.44–1.00)
GFR, Estimated: 57 mL/min — ABNORMAL LOW (ref >60)
Glucose, Bld: 128 mg/dL — ABNORMAL HIGH (ref 70–99)
Potassium: 4.3 mmol/L (ref 3.5–5.1)
Sodium: 134 mmol/L — ABNORMAL LOW (ref 135–145)
Total Bilirubin: 0.7 mg/dL (ref 0.0–1.2)
Total Protein: 5.7 g/dL — ABNORMAL LOW (ref 6.5–8.1)

## 2024-03-29 LAB — GLUCOSE, CAPILLARY
Glucose-Capillary: 132 mg/dL — ABNORMAL HIGH (ref 70–99)
Glucose-Capillary: 153 mg/dL — ABNORMAL HIGH (ref 70–99)
Glucose-Capillary: 118 mg/dL — ABNORMAL HIGH (ref 70–99)
Glucose-Capillary: 182 mg/dL — ABNORMAL HIGH (ref 70–99)

## 2024-03-29 LAB — CBC
HCT: 32.1 % — ABNORMAL LOW (ref 36.0–46.0)
Hemoglobin: 10.1 g/dL — ABNORMAL LOW (ref 12.0–15.0)
MCH: 27.6 pg (ref 26.0–34.0)
MCHC: 31.5 g/dL (ref 30.0–36.0)
MCV: 87.7 fL (ref 80.0–100.0)
Platelets: 255 K/uL (ref 150–400)
RBC: 3.66 MIL/uL — ABNORMAL LOW (ref 3.87–5.11)
RDW: 14.3 % (ref 11.5–15.5)
WBC: 6.5 K/uL (ref 4.0–10.5)
nRBC: 0 % (ref 0.0–0.2)

## 2024-03-29 LAB — URINE CULTURE: Culture: >=100000 — AB

## 2024-03-29 LAB — MAGNESIUM: Magnesium: 1.8 mg/dL (ref 1.7–2.4)

## 2024-03-29 MED ORDER — SODIUM CHLORIDE 0.9 % IV SOLN
1.5000 g | Freq: Four times a day (QID) | INTRAVENOUS | Status: AC
  Administered 2024-03-29 – 2024-04-01 (×12): 1.5 g via INTRAVENOUS
  Filled 2024-03-29 (×13): qty 4

## 2024-03-29 NOTE — Progress Notes (Signed)
 Mobility Specialist Progress Note:    03/29/24 1105  Mobility  Activity Ambulated with assistance (In hallway)  Level of Assistance Standby assist, set-up cues, supervision of patient - no hands on  Assistive Device Front wheel walker  Distance Ambulated (ft) 270 ft  Activity Response Tolerated well  Mobility Referral Yes  Mobility visit 1 Mobility  Mobility Specialist Start Time (ACUTE ONLY) 1053  Mobility Specialist Stop Time (ACUTE ONLY) 1103  Mobility Specialist Time Calculation (min) (ACUTE ONLY) 10 min   Received pt EOB and agreeable to mobility. No physical assistance needed. C/o abdominal pain, otherwise tolerated well. Returned to room without fault. Left pt in bed with personal belongings and call light within reach. All needs met. PA present.  Lavanda Pollack Mobility Specialist  Please contact via [employer] or  Rehab Office [phone]

## 2024-03-29 NOTE — Consult Note (Signed)
 [name] [birth year]  [id number].    Requesting MD: Trixie, MD Chief Complaint/Reason for Consult: acute on chronic abdominal pain  HPI:  [name] is a 89 y/o F with a PMH DM2, GERD, HTN, HLD, SMA stenosis s/p stent,  anxiety, and recurrent UTI's who presents from her assisted living facility [facility]) with a cc worsening abdominal pain. She reports that, in the last 2-3 weeks, she has had worsening, burning, central abdominal pain that starts 30-40 minutes after meals and lasts for a long time. Other associated symptoms include nausea and increased belching. She tells me that in the hospital she has had a liquid diet (Glucerna, ice cream, broth) and she still has the symptoms but they are not as severe as when she was eating the solid food provided to her at [facility]. She reports passing flatus and having small, non-bloody stools. Last BM was this morning and was very small.   At baseline she walks independently with a walker. She denies any history of tobacco, alcohol , or drug use. She tells me that her last colonoscopy and upper endoscopy were around 2007, a couple of years after she retired. She denies any personal or family history of GI cancers.   ROS: Review of Systems  All other systems reviewed and are negative.   Family History  Problem Relation Age of Onset   Cancer Mother        ovaian   Hypertension Father        kidney   Stroke Father    Cancer Father    Hypertension Sister    Diabetes Brother     Past Medical History:  Diagnosis Date   Blood transfusion without reported diagnosis    Cognitive dysfunction 03/04/2019   Patient scores 21 out of 30 on [place] cognitive assessment September 2020   Diabetes mellitus without complication (HCC)    Diabetic peripheral neuropathy associated with type 2 diabetes mellitus (HCC) 02/25/2022   Diverticulitis    Frailty 03/04/2019   H/O bilateral breast reduction surgery    Hyperlipidemia    a.  intolerant to statins.    Hypertension     Past Surgical History:  Procedure Laterality Date   ABDOMINAL HYSTERECTOMY  2003   APPENDECTOMY     age 27   BIOPSY  04/10/2023   Procedure: BIOPSY;  Surgeon: Cinderella Deatrice FALCON, MD;  [facility]: [facility];  Service: Endoscopy;;   BREAST REDUCTION SURGERY     age 50   COLON SURGERY Left 2011   Hemicolectomy due to diverticulitis   ESOPHAGOGASTRODUODENOSCOPY (EGD) WITH PROPOFOL  N/A 04/10/2023   Procedure: ESOPHAGOGASTRODUODENOSCOPY (EGD) WITH PROPOFOL ;  Surgeon: Cinderella Deatrice FALCON, MD;  Location[facility];  Service: Endoscopy;  Laterality: N/A;   EYE SURGERY  03/30/2009   cataract   IR ANGIOGRAM VISCERAL SELECTIVE  04/11/2023   IR INTRAVASCULAR ULTRASOUND NON CORONARY  04/11/2023   IR TRANSCATH PLC STENT 1ST ART NOT LE CV CAR VERT CAR  04/11/2023   IR US  GUIDE VASC ACCESS RIGHT  04/11/2023   KNEE SURGERY Right 2003   knee cap   LAPAROSCOPIC INCISIONAL / UMBILICAL / VENTRAL HERNIA REPAIR  02/23/2007   REDUCTION MAMMAPLASTY Bilateral 2001   REFRACTIVE SURGERY     TOTAL KNEE ARTHROPLASTY Right 04/10/2022   Procedure: TOTAL KNEE ARTHROPLASTY;  Surgeon: Gerome Charleston, MD;  Location: [facility];  Service: Orthopedics;  Laterality: Right;  adductor canal    Social History:  reports that she has  never smoked. She has never used smokeless tobacco. She reports that she does not drink alcohol  and does not use drugs.  Allergies:  Allergies  Allergen Reactions   Statins Other (See Comments)    Severe myalgias   Phenergan [Promethazine Hcl] Other (See Comments)    Hallucinations    Hydrocortisone Itching   Lisinopril Other (See Comments)    Lethargy, Fatigue   Prednisone  Rash   Trazodone  And Nefazodone Cough    Medications Prior to Admission  Medication Sig Dispense Refill   acetaminophen  (TYLENOL ) 325 MG tablet TAKE (2) TABLETS BY MOUTH EVERY SIX HOURS AS NEEDED FOR PAIN. MAX 3GM IN 24 HOURS. 120 tablet 5   albuterol  (VENTOLIN  HFA)  108 (90 Base) MCG/ACT inhaler INHALE 2 PUFFS INTO THE LUNGS EVERY SIX HOURS AS NEEDED FOR WHEEZING OR SHORTNESS OF BREATH. 8.5 g 1   ALPRAZolam  (XANAX ) 0.25 MG tablet TAKE (1) TABLET BY MOUTH AT BEDTIME FOR SLEEP. 30 tablet 3   amLODipine  (NORVASC ) 10 MG tablet TAKE (1) TABLET BY MOUTH ONCE DAILY. 30 tablet 5   aspirin  (ASPIRIN  LOW DOSE) 81 MG chewable tablet CHEW (1) TABLET BY MOUTH ONCE DAILY. 30 tablet 12   BETA CAROTENE PROVITAMIN A 25000 units capsule TAKE (1) CAPSULE BY MOUTH AT BEDTIME. 30 capsule 0   BISACODYL  5 MG EC tablet TAKE 1 TABLET BY MOUTH ONCE DAILY AS NEEDED FOR MODERATE CONSTIPATION. 30 tablet 0   cyanocobalamin  1000 MCG tablet Take 1,000 mcg by mouth at bedtime.     cycloSPORINE  (RESTASIS ) 0.05 % ophthalmic emulsion Place 1 drop into both eyes 2 (two) times daily.     estradiol  (ESTRACE ) 0.1 MG/GM vaginal cream Discard plastic applicator. Insert a blueberry size amount (approximately 1 gram) of cream on fingertip inside vagina at bedtime every night for 1 week then every other night. For long term use. 30 g 3   gabapentin  (NEURONTIN ) 100 MG capsule 1 nightly for neuropathy (Patient taking differently: Take 100 mg by mouth in the morning.) 90 capsule 3   hydrALAZINE  (APRESOLINE ) 25 MG tablet Take 1 tablet (25 mg total) by mouth 3 (three) times daily. 90 tablet 5   IVIZIA DRY EYES 0.5 % SOLN Place 1 drop into both eyes 2 (two) times daily.     Melatonin 10 MG TABS Take 10 mg by mouth at bedtime. 30 tablet 3   METAMUCIL 4 IN 1 FIBER 25 % PACK Take by mouth 2 (two) times daily.     metFORMIN  (GLUCOPHAGE ) 500 MG tablet TAKE (1) TABLET BY MOUTH IN THE MORNING & (1/2) TABLET (250MG ) BY MOUTH AT SUPPER 45 tablet 5   methenamine  (HIPREX ) 1 g tablet Take 1 tablet (1 g total) by mouth 2 (two) times daily with a meal. Most effective when taken with a daily Vitamin C supplement. 60 tablet 11   Multiple Vitamins-Minerals (PRESERVISION AREDS 2) CAPS Take 1 capsule by mouth in the morning and at  bedtime.     mupirocin  ointment (BACTROBAN ) 2 % Apply thin amount twice daily to rash in groin creases twice daily for 5 days at a time when rash occurs 22 g 1   ondansetron  (ZOFRAN -ODT) 4 MG disintegrating tablet 4mg  ODT q4 hours prn nausea/vomit 10 tablet 0   pantoprazole  (PROTONIX ) 40 MG tablet Take 1 tablet (40 mg total) by mouth 2 (two) times daily before a meal. 60 tablet 3   polyethylene glycol powder (GOODSENSE CLEARLAX) 17 GM/SCOOP powder 1 scoop in 8 ounces water  once daily as needed  for constipation 510 g 1   sodium chloride  1 g tablet TAKE (1) TABLET BY MOUTH TWICE DAILY WITH A MEAL. 60 tablet 0   tamsulosin  (FLOMAX ) 0.4 MG CAPS capsule Take 1 capsule (0.4 mg total) by mouth daily. 30 capsule 2   valsartan  (DIOVAN ) 320 MG tablet Take 1 tablet (320 mg total) by mouth daily. 30 tablet 5   Vitamin D , Cholecalciferol , 10 MCG (400 UNIT) TABS TAKE (1) TABLET BY MOUTH ONCE DAILY. 32 tablet 5   glucose blood (EASYMAX TEST) test strip CHECK BLOOD SUGAR ONCE DAILY.(CALL MD IF BS BELOW 60: OR IF BS ABOVE 400) 50 strip 3   ketoconazole  (NIZORAL ) 2 % cream Apply 1 Application topically 2 (two) times daily. For 10 days to groin rash (Patient not taking: Reported on 03/26/2024) 60 g 4   Safety Lancets 28G MISC CHECK BLOOD SUGAR ONCE DAILY. 100 each 0     Physical Exam: Blood pressure (!) 175/67, pulse 73, temperature 97.9 F (36.6 C), temperature source Oral, resp. rate 17, height 5' 8 (1.727 m), weight 79 kg, SpO2 95%. General: Pleasant white female, laying on hospital bed, appears younger than stated age, NAD. HEENT: head -normocephalic, atraumatic; Eyes: PERRLA, no conjunctival injection Neck- Trachea is midline CV- RRR, normal S1/S2, no M/R/G, no lower extremity edema  Pulm- breathing is non-labored on room air Abd- soft, non-distended, overall non-tender, was mildly tender to deep palpation of the central abdomen, just above the umbilicus, no rebound or guarding. Previous laparotomy scar  appears well-healing. Diastasis present GU- deferred  MSK- UE/LE symmetrical, no cyanosis, clubbing, or edema. Neuro- CN II-XII grossly in tact, no paresthesias. Psych- Alert and Oriented x3 with appropriate affect Skin: warm and dry, no rashes or lesions   Results for orders placed or performed during the hospital encounter of 03/26/24 (from the past 48 hours)  Glucose, capillary     Status: Abnormal   Collection Time: 03/27/24 12:05 PM  Result Value Ref Range   Glucose-Capillary 104 (H) 70 - 99 mg/dL    Comment: Glucose reference range applies only to samples taken after fasting for at least 8 hours.  Glucose, capillary     Status: Abnormal   Collection Time: 03/27/24  3:47 PM  Result Value Ref Range   Glucose-Capillary 147 (H) 70 - 99 mg/dL    Comment: Glucose reference range applies only to samples taken after fasting for at least 8 hours.  Glucose, capillary     Status: Abnormal   Collection Time: 03/27/24  8:57 PM  Result Value Ref Range   Glucose-Capillary 116 (H) 70 - 99 mg/dL    Comment: Glucose reference range applies only to samples taken after fasting for at least 8 hours.  Comprehensive metabolic panel with GFR     Status: Abnormal   Collection Time: 03/28/24  3:01 AM  Result Value Ref Range   Sodium 134 (L) 135 - 145 mmol/L   Potassium 4.7 3.5 - 5.1 mmol/L   Chloride 98 98 - 111 mmol/L   CO2 28 22 - 32 mmol/L   Glucose, Bld 130 (H) 70 - 99 mg/dL    Comment: Glucose reference range applies only to samples taken after fasting for at least 8 hours.   BUN 24 (H) 8 - 23 mg/dL   Creatinine, Ser 9.02 0.44 - 1.00 mg/dL   Calcium 9.1 8.9 - 89.6 mg/dL   Total Protein 5.8 (L) 6.5 - 8.1 g/dL   Albumin 3.0 (L) 3.5 - 5.0 g/dL  AST 14 (L) 15 - 41 U/L   ALT 13 0 - 44 U/L   Alkaline Phosphatase 78 38 - 126 U/L   Total Bilirubin 0.6 0.0 - 1.2 mg/dL   GFR, Estimated 56 (L) >60 mL/min    Comment: (NOTE) Calculated using the CKD-EPI Creatinine Equation (2021)    Anion gap 8 5  - 15    Comment: Performed at [facility], [address], [place], [place] [address]  CBC     Status: Abnormal   Collection Time: 03/28/24  3:01 AM  Result Value Ref Range   WBC 5.9 4.0 - 10.5 K/uL   RBC 3.57 (L) 3.87 - 5.11 MIL/uL   Hemoglobin 10.0 (L) 12.0 - 15.0 g/dL   HCT 69.2 (L) 63.9 - 53.9 %   MCV 86.0 80.0 - 100.0 fL   MCH 28.0 26.0 - 34.0 pg   MCHC 32.6 30.0 - 36.0 g/dL   RDW 85.8 88.4 - 84.4 %   Platelets 257 150 - 400 K/uL   nRBC 0.0 0.0 - 0.2 %    Comment: Performed at [facility], [address], [place], [place] [address]  Magnesium      Status: None   Collection Time: 03/28/24  3:01 AM  Result Value Ref Range   Magnesium  1.9 1.7 - 2.4 mg/dL    Comment: Performed at [facility], [address], [place], [place] [address]  Glucose, capillary     Status: Abnormal   Collection Time: 03/28/24  7:31 AM  Result Value Ref Range   Glucose-Capillary 169 (H) 70 - 99 mg/dL    Comment: Glucose reference range applies only to samples taken after fasting for at least 8 hours.  Urinalysis, Routine w reflex microscopic -Urine, Clean Catch     Status: Abnormal   Collection Time: 03/28/24 10:48 AM  Result Value Ref Range   Color, Urine YELLOW YELLOW   APPearance TURBID (A) CLEAR   Specific Gravity, Urine 1.009 1.005 - 1.030   pH 6.0 5.0 - 8.0   Glucose, UA NEGATIVE NEGATIVE mg/dL   Hgb urine dipstick NEGATIVE NEGATIVE   Bilirubin Urine NEGATIVE NEGATIVE   Ketones, ur NEGATIVE NEGATIVE mg/dL   Protein, ur 30 (A) NEGATIVE mg/dL   Nitrite POSITIVE (A) NEGATIVE   Leukocytes,Ua LARGE (A) NEGATIVE   RBC / HPF >50 0 - 5 RBC/hpf   WBC, UA >50 0 - 5 WBC/hpf   Bacteria, UA MANY (A) NONE SEEN   Squamous Epithelial / HPF 0-5 0 - 5 /HPF   WBC Clumps PRESENT    Mucus PRESENT     Comment: Performed at [facility], [address] [place], [place] [address]  Glucose, capillary     Status: Abnormal   Collection Time: 03/28/24 12:04 PM  Result Value Ref Range    Glucose-Capillary 144 (H) 70 - 99 mg/dL    Comment: Glucose reference range applies only to samples taken after fasting for at least 8 hours.  Glucose, capillary     Status: Abnormal   Collection Time: 03/28/24 12:19 PM  Result Value Ref Range   Glucose-Capillary 159 (H) 70 - 99 mg/dL    Comment: Glucose reference range applies only to samples taken after fasting for at least 8 hours.  Protime-INR     Status: None   Collection Time: 03/28/24  2:34 PM  Result Value Ref Range   Prothrombin Time 14.1 11.4 - 15.2 seconds   INR 1.0 0.8 -  1.2    Comment: (NOTE) INR goal varies based on device and disease states. Performed at [facility], [address] [place], [place] [address]   Glucose, capillary     Status: Abnormal   Collection Time: 03/28/24  4:39 PM  Result Value Ref Range   Glucose-Capillary 110 (H) 70 - 99 mg/dL    Comment: Glucose reference range applies only to samples taken after fasting for at least 8 hours.  Glucose, capillary     Status: Abnormal   Collection Time: 03/28/24  8:37 PM  Result Value Ref Range   Glucose-Capillary 153 (H) 70 - 99 mg/dL    Comment: Glucose reference range applies only to samples taken after fasting for at least 8 hours.  Comprehensive metabolic panel with GFR     Status: Abnormal   Collection Time: 03/29/24  2:50 AM  Result Value Ref Range   Sodium 134 (L) 135 - 145 mmol/L   Potassium 4.3 3.5 - 5.1 mmol/L   Chloride 98 98 - 111 mmol/L   CO2 26 22 - 32 mmol/L   Glucose, Bld 128 (H) 70 - 99 mg/dL    Comment: Glucose reference range applies only to samples taken after fasting for at least 8 hours.   BUN 23 8 - 23 mg/dL   Creatinine, Ser 9.04 0.44 - 1.00 mg/dL   Calcium 8.8 (L) 8.9 - 10.3 mg/dL   Total Protein 5.7 (L) 6.5 - 8.1 g/dL   Albumin 3.0 (L) 3.5 - 5.0 g/dL   AST 15 15 - 41 U/L   ALT 12 0 - 44 U/L   Alkaline Phosphatase 68 38 - 126 U/L   Total Bilirubin 0.7 0.0 - 1.2 mg/dL   GFR, Estimated 57 (L) >60 mL/min    Comment:  (NOTE) Calculated using the CKD-EPI Creatinine Equation (2021)    Anion gap 10 5 - 15    Comment: Performed at [facility], [address], [place], [place] [address]  CBC     Status: Abnormal   Collection Time: 03/29/24  2:50 AM  Result Value Ref Range   WBC 6.5 4.0 - 10.5 K/uL   RBC 3.66 (L) 3.87 - 5.11 MIL/uL   Hemoglobin 10.1 (L) 12.0 - 15.0 g/dL   HCT 67.8 (L) 63.9 - 53.9 %   MCV 87.7 80.0 - 100.0 fL   MCH 27.6 26.0 - 34.0 pg   MCHC 31.5 30.0 - 36.0 g/dL   RDW 85.6 88.4 - 84.4 %   Platelets 255 150 - 400 K/uL   nRBC 0.0 0.0 - 0.2 %    Comment: Performed at [facility], [address] [place], [place] [address]  Magnesium      Status: None   Collection Time: 03/29/24  2:50 AM  Result Value Ref Range   Magnesium  1.8 1.7 - 2.4 mg/dL    Comment: Performed at [facility], [address] [place], [place] [address]  Glucose, capillary     Status: Abnormal   Collection Time: 03/29/24  8:03 AM  Result Value Ref Range   Glucose-Capillary 132 (H) 70 - 99 mg/dL    Comment: Glucose reference range applies only to samples taken after fasting for at least 8 hours.   CT ABDOMEN PELVIS WO CONTRAST Result Date: 03/28/2024 EXAM: CT ABDOMEN AND PELVIS WITHOUT CONTRAST 03/28/2024 10:10:16 AM TECHNIQUE: CT of the abdomen and pelvis was performed without the administration of intravenous contrast. Multiplanar reformatted images are provided for  review. Automated exposure control, iterative reconstruction, and/or weight-based adjustment of the mA/kV was utilized to reduce the radiation dose to as low as reasonably achievable. COMPARISON: CT angio abdomen and pelvis with contrast 03/26/2024. CLINICAL HISTORY: Abdominal pain, acute, nonlocalized; worsening abdominal pain. FINDINGS: LOWER CHEST: No acute abnormality. LIVER: A small cyst or hemangioma near the dome of the liver is stable. GALLBLADDER AND BILE DUCTS: Multiple gallstones are again noted at the neck of the gallbladder. No  inflammatory changes or associated. No biliary ductal dilatation. SPLEEN: No acute abnormality. PANCREAS: No acute abnormality. ADRENAL GLANDS: No acute abnormality. KIDNEYS, URETERS AND BLADDER: No stones in the kidneys or ureters. No hydronephrosis. No perinephric or periureteral stranding. Mild bladder wall thickening is less prominent than on the previous study. GI AND BOWEL: Stomach demonstrates no acute abnormality. Diverticular changes are again noted throughout the descending and sigmoid colon without focal inflammation to suggest diverticulitis. There is no bowel obstruction. PERITONEUM AND RETROPERITONEUM: No ascites. No free air. VASCULATURE: Extensive atherosclerotic changes are again noted in the aorta. Stent at the origin of the SMA is noted. LYMPH NODES: No lymphadenopathy. REPRODUCTIVE ORGANS: No acute abnormality. BONES AND SOFT TISSUES: Ventral hernia repair. No acute osseous abnormality. No focal soft tissue abnormality. IMPRESSION: 1. No acute findings. 2. Multiple gallstones at the neck of the gallbladder without inflammatory changes. 3. Diverticular changes in the descending and sigmoid colon without evidence of diverticulitis. Electronically signed by: Lonni Necessary MD 03/28/2024 10:47 AM EDT RP Workstation: [id number]      Assessment/Plan Post-prandial abdominal pain Ms. Bellefeuille is a pleasant 89 y/o F with PMH DM2 (controlled), GERD, and SMA stenosis s/p stent placement 04/2023 by IR who presents with a few weeks of worsening post-prandial abdominal pain associated with nausea and belching. Hospital workup revealed an E.Coli UTI for which she is receiving antibiotics. CTA of the abdomen and pelvis significant for celiac artery stenosis, renal artery stenosis, and a widely patent SMA stent. She has a previous history of multiple abdominal surgeries including appendectomy, hysterectomy, partial colectomy for diverticulitis, and ventral hernia repair with mesh. She has no evidence of  bowel obstruction and no evidence of a complication related to her existing mesh. I do not think her surgical history or mesh are contributing to her abdominal pain. I question if her symptoms are a result of her high grade celiac artery stenosis. While her risk factors and symptoms are not consistent with PUD or gastritis, this cannot be completely excluded, as she has not had endoscopic evaluation in many years. Consider GI evaluation for upper endoscopy. IR has been following as well but recommend ruling out all other sources of pain prior to consideration of celiac stent placement. May be worthwhile to discuss her CTA findings with vascular surgery even though all of her mesenteric disease has been managed by GI/IR up to this point.   No acute surgical needs. Patient has also stated that she does not want surgery at her age, even if it were indicated. We will sign off. Please call as needed.   I reviewed nursing notes, Consultant IR notes, hospitalist notes, last 24 h vitals and pain scores, last 48 h intake and output, last 24 h labs and trends, and last 24 h imaging results.  Almarie GORMAN Pringle, PA-C [facility] 03/29/2024, 11:45 AM Please see Amion for pager number during day hours 7:00am-4:30pm or 7:00am -11:30am on weekends

## 2024-03-29 NOTE — Care Management Important Message (Signed)
 Important Message  Patient Details  Name: [name] MRN: [id number] Date of Birth: 03-19-1932   Important Message Given:  Yes - Medicare IM     Jon Cruel 03/29/2024, 12:58 PM

## 2024-03-29 NOTE — Consult Note (Signed)
 Palliative Care Consult Note                                  Date: 03/29/2024   Patient Name: [name]  DOB:06-22-1932  FMW:[id number]  Age / Sex:89 y.o., female  PCP: [name], MD Referring Physician: Trixie Nilda HERO, MD  Reason for Consultation: Establishing goals of care  Past Medical History:  Diagnosis Date   Blood transfusion without reported diagnosis    Cognitive dysfunction 03/04/2019   Patient scores 21 out of 30 on [place] cognitive assessment September 2020   Diabetes mellitus without complication (HCC)    Diabetic peripheral neuropathy associated with type 2 diabetes mellitus (HCC) 02/25/2022   Diverticulitis    Frailty 03/04/2019   H/O bilateral breast reduction surgery    Hyperlipidemia    a. intolerant to statins.    Hypertension      Assessment & Plan:   HPI/Patient Profile: 89 y.o. 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 Palliative consulted after patient expressed interest in hospice and desire to no longer pursue invasive interventions.   SUMMARY OF RECOMMENDATIONS   DNR - limited Continue current management, with SNF and Rehab with outpatient palliative following for future deterioration   Code Status: DNR - Limited (DNR/DNI)  Prognosis:  Unable to determine  Discharge Planning:  Skilled Nursing Facility for rehab with Palliative care service follow-up   Discussed with: Trixie MD, Estelle RN, patient  Subjective:   Reviewed medical records, received report from team, assessed the patient and then meet at the patient's bedside to discuss diagnosis, prognosis, GOC, EOL wishes disposition and options.  Before meeting with the patient/family, I spent time reviewing the chart notes including H&P, radiology, surgery consult notes. I also  reviewed vital signs, nursing flowsheets, medication administrations record, labs, and imaging.  I met with the [name] (patient).   We meet to discuss diagnosis prognosis, GOC, EOL wishes, disposition and options. Concept of Palliative Care was introduced as specialized medical care for people and their families living with serious illness.  If focuses on providing relief from the symptoms and stress of a serious illness.  The goal is to improve quality of life for both the patient and the family. Values and goals of care important to patient and family were attempted to be elicited.  Created space and opportunity for patient  and family to explore thoughts and feelings regarding current medical situation   Natural trajectory and current clinical status were discussed. Questions and concerns addressed. Patient encouraged to call with questions or concerns.    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 [employer] and at [employer] near [place], [place]  Baseline Status: - Lives fairly independently at ALF and was always active and  participated in PT/OT when possible - In the last 2 weeks she reported fatigue which has hindered with her ability to remain as active as she would like that coincides with her abdominal pain - Reports weight gain about a month ago at her outpatient visit - Reports that she continues to eat well and has an appetite but has  been hesitant to eat more recently due to abdominal pain after meals  Today's Discussion: - Patient requested hospice services but after further discussions, patient is not ready to transition - She is still open to treat what she can in order to reduce her abdominal pain - She wants to continue the current IVF and antibiotics that she is on to treat her UTI - Does express that she wants to get to rehab and regain some of her function before hospitalization, but knows that she does not want to return to the hospital if she were to decline again - Educated patient on options and specialties that were consulted including general surgery and radiology - Discussed options and the path she chose is to discharge to SNF with Rehab and have outpatient palliative to follow and trigger hospice is she declines again  Goals: - To get strong enough to go back to her ALF - To pass with peace and dignity  Review of Systems  Constitutional:  Positive for appetite change and fatigue.  Gastrointestinal:  Positive for abdominal pain.  Genitourinary:  Positive for dyspareunia and urgency.    Objective:   Primary Diagnoses: Present on Admission:  Mesenteric angina  Type 2 diabetes mellitus with hyperglycemia (HCC)  Type 2 diabetes mellitus with atherosclerosis of aorta (HCC)  Hyperlipidemia associated with type 2 diabetes mellitus (HCC)  Abdominal pain, chronic, epigastric   Vital Signs:  BP (!) 166/68   Pulse 96   Temp 98.1 F (36.7 C)   Resp 17   Ht 5' 8 (1.727 m)   Wt 79 kg   SpO2 98%   BMI 26.48 kg/m   Physical Exam HENT:     Head: Normocephalic.  Eyes:     Extraocular Movements: Extraocular movements intact.  Pulmonary:     Effort: Pulmonary effort is normal. No respiratory distress.  Neurological:     General: No focal deficit present.     Mental Status: She is alert and oriented to person, place, and time.  Psychiatric:        Mood and Affect: Mood normal.    Palliative  Assessment/Data: 70%   Thank you for allowing us  to participate in the care of [name] PMT will continue to support holistically.  Time Total: 75 minutes  Detailed review of medical records (labs, imaging, vital signs), medically appropriate exam, discussed with treatment team, counseling and education to patient, family, & staff, documenting clinical information, medication management, coordination of care.  Signed by: Fairy FORBES Shan DEVONNA Palliative Medicine Team  Team Phone # [phone] (Nights/Weekends)  03/29/2024, 4:04 PM

## 2024-03-29 NOTE — Progress Notes (Signed)
 PROGRESS NOTE  [name] FMW:[id number] DOB: 05-09-1932 DOA: 03/26/2024 PCP: [name], MD   LOS: 3 days   Brief Narrative / Interim history: 89 y.o. female, with medical history significant of hypertension, hyperlipidemia, T2DM, GERD mesenteric artery stenosis and stent placement, history of urinary retention with recurrent UTIs, she lives at ALF, came into the hospital with abdominal pain, nausea, poor appetite.  She has a history of mesenteric ischemia with prior SMA stent.  He has a history of mesenteric underwent a CT angiogram which showed patent SMA stent, however celiac artery stenosis.  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  Admitting MD discussed with IR Dr. Luverne - Continue liquid diet for now, continue aspirin  and Plavix  - IR consulted, plans for stenting hopefully today  Active problems UTI-urinary dysuria worsening today, she has a history of C. difficile, will resend a UA but given symptoms and urinalysis while in the ED would favor to treat.  Fosfomycin x 1, less dysuria but still some.  If persists will dose fosfomycin again tomorrow  DM2-controlled, A1c was 6.3 just recently.  Keep on sliding scale.  Hold metformin   Essential hypertension-continue amlodipine , hydralazine , hold Avapro .  Blood pressure still on the high side in the setting of pain  Generalized anxiety disorder-continue Xanax   Scheduled Meds:  amLODipine   10 mg Oral Daily   artificial tears  1 drop Both Eyes BID   aspirin   81 mg Oral Daily   clopidogrel   75 mg Oral Daily   cyanocobalamin   1,000 mcg Oral QHS    cycloSPORINE   1 drop Both Eyes BID   docusate sodium   100 mg Oral BID   enoxaparin  (LOVENOX ) injection  40 mg Subcutaneous Q24H   estradiol   1 Applicatorful Vaginal QHS   feeding supplement (GLUCERNA SHAKE)  237 mL Oral TID BM   gabapentin   100 mg Oral q AM   hydrALAZINE   25 mg Oral TID   insulin  aspart  0-9 Units Subcutaneous TID WC   melatonin  9 mg Oral QHS   methenamine   1 g Oral BID WC   multivitamin  1 tablet Oral Daily   pantoprazole   40 mg Oral BID AC   tamsulosin   0.4 mg Oral Daily   Continuous Infusions:   PRN Meds:.acetaminophen  **OR** acetaminophen , albuterol , ALPRAZolam , bisacodyl , hydrALAZINE , morphine  injection, ondansetron  **OR** ondansetron  (ZOFRAN ) IV, polyethylene glycol, vitamin A & D  Current Outpatient Medications  Medication Instructions   acetaminophen  (TYLENOL ) 325 MG tablet TAKE (2) TABLETS BY MOUTH EVERY SIX HOURS AS NEEDED FOR PAIN. MAX 3GM IN 24 HOURS.   albuterol  (VENTOLIN  HFA) 108 (90 Base) MCG/ACT inhaler 2 puffs, Inhalation, Every 6 hours PRN   ALPRAZolam  (XANAX ) 0.25 MG tablet TAKE (1) TABLET BY MOUTH AT BEDTIME FOR SLEEP.   amLODipine  (NORVASC ) 10 MG tablet TAKE (1) TABLET BY MOUTH ONCE DAILY.   aspirin  (ASPIRIN  LOW DOSE) 81 MG chewable tablet CHEW (1) TABLET BY MOUTH ONCE DAILY.   BETA CAROTENE PROVITAMIN A 25000 units capsule TAKE (1) CAPSULE BY MOUTH AT BEDTIME.   BISACODYL  5 MG EC tablet TAKE 1 TABLET BY MOUTH ONCE DAILY AS NEEDED FOR MODERATE CONSTIPATION.   cyanocobalamin  1,000  mcg, Daily at bedtime   cycloSPORINE  (RESTASIS ) 0.05 % ophthalmic emulsion 1 drop, 2 times daily   estradiol  (ESTRACE ) 0.1 MG/GM vaginal cream Discard plastic applicator. Insert a blueberry size amount (approximately 1 gram) of cream on fingertip inside vagina at bedtime every night for 1 week then every other night. For long term use.   gabapentin  (NEURONTIN ) 100 MG capsule 1 nightly for neuropathy   glucose blood (EASYMAX TEST) test strip CHECK BLOOD SUGAR ONCE  DAILY.(CALL MD IF BS BELOW 60: OR IF BS ABOVE 400)   hydrALAZINE  (APRESOLINE ) 25 mg, Oral, 3 times daily   IVIZIA DRY EYES 0.5 % SOLN 1 drop, 2 times daily   ketoconazole  (NIZORAL ) 2 % cream 1 Application, Topical, 2 times daily, For 10 days to groin rash   Melatonin 10 mg, Oral, Daily at bedtime   METAMUCIL 4 IN 1 FIBER 25 % PACK Oral, 2 times daily   metFORMIN  (GLUCOPHAGE ) 500 MG tablet TAKE (1) TABLET BY MOUTH IN THE MORNING & (1/2) TABLET (250MG ) BY MOUTH AT SUPPER   methenamine  (HIPREX ) 1 g, Oral, 2 times daily with meals, Most effective when taken with a daily Vitamin C supplement.   Multiple Vitamins-Minerals (PRESERVISION AREDS 2) CAPS 1 capsule, 2 times daily   mupirocin  ointment (BACTROBAN ) 2 % Apply thin amount twice daily to rash in groin creases twice daily for 5 days at a time when rash occurs   ondansetron  (ZOFRAN -ODT) 4 MG disintegrating tablet 4mg  ODT q4 hours prn nausea/vomit   pantoprazole  (PROTONIX ) 40 mg, Oral, 2 times daily before meals   polyethylene glycol powder (GOODSENSE CLEARLAX) 17 GM/SCOOP powder 1 scoop in 8 ounces water  once daily as needed for constipation   Safety Lancets 28G MISC CHECK BLOOD SUGAR ONCE DAILY.   sodium chloride  1 g tablet TAKE (1) TABLET BY MOUTH TWICE DAILY WITH A MEAL.   tamsulosin  (FLOMAX ) 0.4 mg, Oral, Daily   valsartan  (DIOVAN ) 320 mg, Oral, Daily   Vitamin D , Cholecalciferol , 10 MCG (400 UNIT) TABS TAKE (1) TABLET BY MOUTH ONCE DAILY.    Diet Orders (From admission, onward)     Start     Ordered   03/29/24 0001  Diet NPO time specified Except for: Sips with Meds  Diet effective midnight       Question:  Except for  Answer:  Sips with Meds   03/28/24 1152            DVT prophylaxis: enoxaparin  (LOVENOX ) injection 40 mg Start: 03/26/24 2200 SCDs Start: 03/26/24 1925   Lab Results  Component Value Date   PLT 255 03/29/2024      Code Status: Limited: Do not attempt resuscitation (DNR) -DNR-LIMITED -Do Not Intubate/DNI    Family Communication: No family at bedside  Status is: Inpatient Remains inpatient appropriate because: Severity of illness  Level of care: Telemetry Medical  Consultants:  Interventional radiology  Objective: Vitals:   03/28/24 2045 03/29/24 0437 03/29/24 0856 03/29/24 0901  BP: (!) 133/44 (!) 177/57 (!) 175/67 (!) 175/67  Pulse:  69 73   Resp:   17   Temp:  97.9 F (36.6 C)    TempSrc:  Oral    SpO2:  100% 95%   Weight:      Height:        Intake/Output Summary (Last 24 hours) at 03/29/2024 0926 Last data filed at 03/28/2024 1100 Gross per 24 hour  Intake 120 ml  Output --  Net 120 ml   Wt Readings from Last  3 Encounters:  03/26/24 79 kg  03/24/24 79.8 kg  03/03/24 78.5 kg    Examination: Constitutional: NAD Eyes: lids and conjunctivae normal, no scleral icterus ENMT: mmm Neck: normal, supple Respiratory: clear to auscultation bilaterally, no wheezing, no crackles. Cardiovascular: Regular rate and rhythm, no murmurs / rubs / gallops. No LE edema. Abdomen: soft, no distention, no tenderness. Bowel sounds positive.   Data Reviewed: I have independently reviewed following labs and imaging studies   CBC Recent Labs  Lab 03/26/24 1326 03/27/24 0542 03/28/24 0301 03/29/24 0250  WBC 7.1 6.1 5.9 6.5  HGB 10.6* 10.0* 10.0* 10.1*  HCT 34.0* 31.3* 30.7* 32.1*  PLT 251 258 257 255  MCV 87.6 87.2 86.0 87.7  MCH 27.3 27.9 28.0 27.6  MCHC 31.2 31.9 32.6 31.5  RDW 14.1 14.2 14.1 14.3  LYMPHSABS 1.8  --   --   --   MONOABS 0.7  --   --   --   EOSABS 0.1  --   --   --   BASOSABS 0.0  --   --   --     Recent Labs  Lab 03/26/24 1326 03/27/24 0542 03/28/24 0301 03/28/24 1434 03/29/24 0250  NA 136 134* 134*  --  134*  K 3.9 4.7 4.7  --  4.3  CL 97* 100 98  --  98  CO2 24 25 28   --  26  GLUCOSE 128* 120* 130*  --  128*  BUN 15 18 24*  --  23  CREATININE 0.78 0.97 0.97  --  0.95  CALCIUM 9.9 9.2 9.1  --  8.8*  AST 18  --  14*  --  15  ALT 15  --  13   --  12  ALKPHOS 93  --  78  --  68  BILITOT 1.0  --  0.6  --  0.7  ALBUMIN 4.0  --  3.0*  --  3.0*  MG  --   --  1.9  --  1.8  INR  --   --   --  1.0  --     ------------------------------------------------------------------------------------------------------------------ Recent Labs    03/27/24 0542  CHOL 163  HDL 43  LDLCALC 91  TRIG 144  CHOLHDL 3.8    Lab Results  Component Value Date   HGBA1C 6.3 (H) 12/26/2023   ------------------------------------------------------------------------------------------------------------------ No results for input(s): TSH, T4TOTAL, T3FREE, THYROIDAB in the last 72 hours.  Invalid input(s): FREET3  Cardiac Enzymes No results for input(s): CKMB, TROPONINI, MYOGLOBIN in the last 168 hours.  Invalid input(s): CK ------------------------------------------------------------------------------------------------------------------    Component Value Date/Time   BNP 88.0 09/22/2023 1848    CBG: Recent Labs  Lab 03/28/24 1204 03/28/24 1219 03/28/24 1639 03/28/24 2037 03/29/24 0803  GLUCAP 144* 159* 110* 153* 132*    Recent Results (from the past 240 hours)  Urine Culture     Status: Abnormal   Collection Time: 03/26/24  3:56 PM   Specimen: Urine, Clean Catch  Result Value Ref Range Status   Specimen Description   Final    URINE, CLEAN CATCH Performed at [facility], [address], [place], [place] [address]    Special Requests   Final    NONE Performed at [facility], [address], [place], [place] [address]    Culture >=100,000 COLONIES/mL ESCHERICHIA COLI (A)  Final   Report Status 03/29/2024 FINAL  Final   Organism ID, Bacteria ESCHERICHIA COLI (A)  Final  Susceptibility   Escherichia coli - MIC*    AMPICILLIN <=2 SENSITIVE Sensitive     CEFAZOLIN  (URINE) Value in next row Sensitive      <=1 SENSITIVEThis is a modified FDA-approved test that has been validated and its performance characteristics  determined by the reporting laboratory.  This laboratory is certified under the Clinical Laboratory Improvement Amendments CLIA as qualified to perform high complexity clinical laboratory testing.    CEFEPIME  Value in next row Sensitive      <=1 SENSITIVEThis is a modified FDA-approved test that has been validated and its performance characteristics determined by the reporting laboratory.  This laboratory is certified under the Clinical Laboratory Improvement Amendments CLIA as qualified to perform high complexity clinical laboratory testing.    ERTAPENEM Value in next row Sensitive      <=1 SENSITIVEThis is a modified FDA-approved test that has been validated and its performance characteristics determined by the reporting laboratory.  This laboratory is certified under the Clinical Laboratory Improvement Amendments CLIA as qualified to perform high complexity clinical laboratory testing.    CEFTRIAXONE  Value in next row Sensitive      <=1 SENSITIVEThis is a modified FDA-approved test that has been validated and its performance characteristics determined by the reporting laboratory.  This laboratory is certified under the Clinical Laboratory Improvement Amendments CLIA as qualified to perform high complexity clinical laboratory testing.    CIPROFLOXACIN  Value in next row Sensitive      <=1 SENSITIVEThis is a modified FDA-approved test that has been validated and its performance characteristics determined by the reporting laboratory.  This laboratory is certified under the Clinical Laboratory Improvement Amendments CLIA as qualified to perform high complexity clinical laboratory testing.    GENTAMICIN Value in next row Sensitive      <=1 SENSITIVEThis is a modified FDA-approved test that has been validated and its performance characteristics determined by the reporting laboratory.  This laboratory is certified under the Clinical Laboratory Improvement Amendments CLIA as qualified to perform high complexity  clinical laboratory testing.    NITROFURANTOIN  Value in next row Sensitive      <=1 SENSITIVEThis is a modified FDA-approved test that has been validated and its performance characteristics determined by the reporting laboratory.  This laboratory is certified under the Clinical Laboratory Improvement Amendments CLIA as qualified to perform high complexity clinical laboratory testing.    TRIMETH /SULFA  Value in next row Sensitive      <=1 SENSITIVEThis is a modified FDA-approved test that has been validated and its performance characteristics determined by the reporting laboratory.  This laboratory is certified under the Clinical Laboratory Improvement Amendments CLIA as qualified to perform high complexity clinical laboratory testing.    AMPICILLIN/SULBACTAM Value in next row Sensitive      <=1 SENSITIVEThis is a modified FDA-approved test that has been validated and its performance characteristics determined by the reporting laboratory.  This laboratory is certified under the Clinical Laboratory Improvement Amendments CLIA as qualified to perform high complexity clinical laboratory testing.    PIP/TAZO Value in next row Sensitive      <=4 SENSITIVEThis is a modified FDA-approved test that has been validated and its performance characteristics determined by the reporting laboratory.  This laboratory is certified under the Clinical Laboratory Improvement Amendments CLIA as qualified to perform high complexity clinical laboratory testing.    MEROPENEM Value in next row Sensitive      <=4 SENSITIVEThis is a modified FDA-approved test that has been validated and its performance characteristics determined by the  reporting laboratory.  This laboratory is certified under the Clinical Laboratory Improvement Amendments CLIA as qualified to perform high complexity clinical laboratory testing.    * >=100,000 COLONIES/mL ESCHERICHIA COLI     Radiology Studies: CT ABDOMEN PELVIS WO CONTRAST Result Date:  03/28/2024 EXAM: CT ABDOMEN AND PELVIS WITHOUT CONTRAST 03/28/2024 10:10:16 AM TECHNIQUE: CT of the abdomen and pelvis was performed without the administration of intravenous contrast. Multiplanar reformatted images are provided for review. Automated exposure control, iterative reconstruction, and/or weight-based adjustment of the mA/kV was utilized to reduce the radiation dose to as low as reasonably achievable. COMPARISON: CT angio abdomen and pelvis with contrast 03/26/2024. CLINICAL HISTORY: Abdominal pain, acute, nonlocalized; worsening abdominal pain. FINDINGS: LOWER CHEST: No acute abnormality. LIVER: A small cyst or hemangioma near the dome of the liver is stable. GALLBLADDER AND BILE DUCTS: Multiple gallstones are again noted at the neck of the gallbladder. No inflammatory changes or associated. No biliary ductal dilatation. SPLEEN: No acute abnormality. PANCREAS: No acute abnormality. ADRENAL GLANDS: No acute abnormality. KIDNEYS, URETERS AND BLADDER: No stones in the kidneys or ureters. No hydronephrosis. No perinephric or periureteral stranding. Mild bladder wall thickening is less prominent than on the previous study. GI AND BOWEL: Stomach demonstrates no acute abnormality. Diverticular changes are again noted throughout the descending and sigmoid colon without focal inflammation to suggest diverticulitis. There is no bowel obstruction. PERITONEUM AND RETROPERITONEUM: No ascites. No free air. VASCULATURE: Extensive atherosclerotic changes are again noted in the aorta. Stent at the origin of the SMA is noted. LYMPH NODES: No lymphadenopathy. REPRODUCTIVE ORGANS: No acute abnormality. BONES AND SOFT TISSUES: Ventral hernia repair. No acute osseous abnormality. No focal soft tissue abnormality. IMPRESSION: 1. No acute findings. 2. Multiple gallstones at the neck of the gallbladder without inflammatory changes. 3. Diverticular changes in the descending and sigmoid colon without evidence of diverticulitis.  Electronically signed by: Lonni Necessary MD 03/28/2024 10:47 AM EDT RP Workstation: [id number]      Nilda Fendt, MD, PhD [facility]  Between 7 am - 7 pm I am available, please contact me via Amion (for emergencies) or Securechat (non urgent messages)  Between 7 pm - 7 am I am not available, please contact night coverage MD/APP via Amion

## 2024-03-29 NOTE — Plan of Care (Signed)
  Problem: Pain Managment: Goal: General experience of comfort will improve and/or be controlled Outcome: Progressing   Problem: Safety: Goal: Ability to remain free from injury will improve Outcome: Progressing

## 2024-03-29 NOTE — Progress Notes (Signed)
 Dinner tray ordered.

## 2024-03-29 NOTE — Progress Notes (Signed)
 Contact precautions initiated for VRE/ESBL

## 2024-03-29 NOTE — Progress Notes (Addendum)
 Referring Physician(s): Dr. Trixie  Supervising Physician: Philip Cornet  Patient Status:  [facility] - In-pt  Chief Complaint: Abdominal pain  Subjective: Lying in bed, fatigued.  Complains of ongoing abdominal pain not completely relieved by IV pain medication.  Last PO intake was yesterday.  Brings to light more history related to her abdominal pain.   Allergies: Statins, Phenergan [promethazine hcl], Hydrocortisone, Lisinopril, Prednisone , and Trazodone  and nefazodone  Medications: Prior to Admission medications   Medication Sig Start Date End Date Taking? Authorizing Provider  acetaminophen  (TYLENOL ) 325 MG tablet TAKE (2) TABLETS BY MOUTH EVERY SIX HOURS AS NEEDED FOR PAIN. MAX 3GM IN 24 HOURS. 07/28/23  Yes [name], MD  albuterol  (VENTOLIN  HFA) 108 (90 Base) MCG/ACT inhaler INHALE 2 PUFFS INTO THE LUNGS EVERY SIX HOURS AS NEEDED FOR WHEEZING OR SHORTNESS OF BREATH. 09/26/23  Yes Luking, Scott A, MD  ALPRAZolam  (XANAX ) 0.25 MG tablet TAKE (1) TABLET BY MOUTH AT BEDTIME FOR SLEEP. 02/25/24  Yes Luking, Glendia LABOR, MD  amLODipine  (NORVASC ) 10 MG tablet TAKE (1) TABLET BY MOUTH ONCE DAILY. 10/07/23  Yes Luking, Glendia LABOR, MD  aspirin  (ASPIRIN  LOW DOSE) 81 MG chewable tablet CHEW (1) TABLET BY MOUTH ONCE DAILY. 09/06/23  Yes Luking, Glendia LABOR, MD  BETA CAROTENE PROVITAMIN A 25000 units capsule TAKE (1) CAPSULE BY MOUTH AT BEDTIME. 02/24/24  Yes Luking, Scott A, MD  BISACODYL  5 MG EC tablet TAKE 1 TABLET BY MOUTH ONCE DAILY AS NEEDED FOR MODERATE CONSTIPATION. 12/20/22  Yes [name], MD  cyanocobalamin  1000 MCG tablet Take 1,000 mcg by mouth at bedtime.   Yes [provider]  cycloSPORINE  (RESTASIS ) 0.05 % ophthalmic emulsion Place 1 drop into both eyes 2 (two) times daily.   Yes [provider]  estradiol  (ESTRACE ) 0.1 MG/GM vaginal cream Discard plastic applicator. Insert a blueberry size amount (approximately 1 gram) of cream on fingertip inside vagina at bedtime  every night for 1 week then every other night. For long term use. 01/15/24  Yes Larocco, Lauraine BROCKS, FNP  gabapentin  (NEURONTIN ) 100 MG capsule 1 nightly for neuropathy Patient taking differently: Take 100 mg by mouth in the morning. 03/08/24  Yes [name], MD  hydrALAZINE  (APRESOLINE ) 25 MG tablet Take 1 tablet (25 mg total) by mouth 3 (three) times daily. 12/31/23  Yes Luking, Glendia LABOR, MD  IVIZIA DRY EYES 0.5 % SOLN Place 1 drop into both eyes 2 (two) times daily. 09/22/23  Yes [provider]  Melatonin 10 MG TABS Take 10 mg by mouth at bedtime. 03/24/24  Yes Grooms, Courtney, PA-C  METAMUCIL 4 IN 1 FIBER 25 % PACK Take by mouth 2 (two) times daily. 03/25/24  Yes [provider]  metFORMIN  (GLUCOPHAGE ) 500 MG tablet TAKE (1) TABLET BY MOUTH IN THE MORNING & (1/2) TABLET (250MG ) BY MOUTH AT SUPPER 10/07/23  Yes Luking, Glendia LABOR, MD  methenamine  (HIPREX ) 1 g tablet Take 1 tablet (1 g total) by mouth 2 (two) times daily with a meal. Most effective when taken with a daily Vitamin C supplement. 01/15/24  Yes Gerldine Lauraine BROCKS, FNP  Multiple Vitamins-Minerals (PRESERVISION AREDS 2) CAPS Take 1 capsule by mouth in the morning and at bedtime.   Yes [provider]  mupirocin  ointment (BACTROBAN ) 2 % Apply thin amount twice daily to rash in groin creases twice daily for 5 days at a time when rash occurs 03/07/24  Yes Luking, Glendia LABOR, MD  ondansetron  (ZOFRAN -ODT) 4 MG disintegrating  tablet 4mg  ODT q4 hours prn nausea/vomit 09/05/23  Yes Zammit, Joseph, MD  pantoprazole  (PROTONIX ) 40 MG tablet Take 1 tablet (40 mg total) by mouth 2 (two) times daily before a meal. 12/24/23  Yes Cook, Jayce G, DO  polyethylene glycol powder (GOODSENSE CLEARLAX) 17 GM/SCOOP powder 1 scoop in 8 ounces water  once daily as needed for constipation 06/03/23  Yes Luking, Scott A, MD  sodium chloride  1 g tablet TAKE (1) TABLET BY MOUTH TWICE DAILY WITH A MEAL. 01/26/24  Yes [name], MD  tamsulosin  (FLOMAX ) 0.4 MG  CAPS capsule Take 1 capsule (0.4 mg total) by mouth daily. 03/08/24  Yes Dahlstedt, Garnette, MD  valsartan  (DIOVAN ) 320 MG tablet Take 1 tablet (320 mg total) by mouth daily. 12/22/23  Yes Luking, Glendia LABOR, MD  Vitamin D , Cholecalciferol , 10 MCG (400 UNIT) TABS TAKE (1) TABLET BY MOUTH ONCE DAILY. 10/07/23  Yes Luking, Scott A, MD  glucose blood (EASYMAX TEST) test strip CHECK BLOOD SUGAR ONCE DAILY.(CALL MD IF BS BELOW 60: OR IF BS ABOVE 400) 10/07/23   Luking, Glendia LABOR, MD  ketoconazole  (NIZORAL ) 2 % cream Apply 1 Application topically 2 (two) times daily. For 10 days to groin rash Patient not taking: Reported on 03/26/2024 03/03/24   [name], MD  Safety Lancets 28G MISC CHECK BLOOD SUGAR ONCE DAILY. 01/21/24   [name], MD     Vital Signs: BP (!) 175/67   Pulse 73   Temp 97.9 F (36.6 C) (Oral)   Resp 17   Ht 5' 8 (1.727 m)   Wt 174 lb 2.6 oz (79 kg)   SpO2 95%   BMI 26.48 kg/m   Physical Exam NAD, alert, fatigued Abdomen:  soft, generalized tenderness across the lower abdomen with mild rebound tenderness and guarding with palpation.  Pain not exclusive to PO intake.    Imaging: CT ABDOMEN PELVIS WO CONTRAST Result Date: 03/28/2024 EXAM: CT ABDOMEN AND PELVIS WITHOUT CONTRAST 03/28/2024 10:10:16 AM TECHNIQUE: CT of the abdomen and pelvis was performed without the administration of intravenous contrast. Multiplanar reformatted images are provided for review. Automated exposure control, iterative reconstruction, and/or weight-based adjustment of the mA/kV was utilized to reduce the radiation dose to as low as reasonably achievable. COMPARISON: CT angio abdomen and pelvis with contrast 03/26/2024. CLINICAL HISTORY: Abdominal pain, acute, nonlocalized; worsening abdominal pain. FINDINGS: LOWER CHEST: No acute abnormality. LIVER: A small cyst or hemangioma near the dome of the liver is stable. GALLBLADDER AND BILE DUCTS: Multiple gallstones are again noted at the neck of the gallbladder.  No inflammatory changes or associated. No biliary ductal dilatation. SPLEEN: No acute abnormality. PANCREAS: No acute abnormality. ADRENAL GLANDS: No acute abnormality. KIDNEYS, URETERS AND BLADDER: No stones in the kidneys or ureters. No hydronephrosis. No perinephric or periureteral stranding. Mild bladder wall thickening is less prominent than on the previous study. GI AND BOWEL: Stomach demonstrates no acute abnormality. Diverticular changes are again noted throughout the descending and sigmoid colon without focal inflammation to suggest diverticulitis. There is no bowel obstruction. PERITONEUM AND RETROPERITONEUM: No ascites. No free air. VASCULATURE: Extensive atherosclerotic changes are again noted in the aorta. Stent at the origin of the SMA is noted. LYMPH NODES: No lymphadenopathy. REPRODUCTIVE ORGANS: No acute abnormality. BONES AND SOFT TISSUES: Ventral hernia repair. No acute osseous abnormality. No focal soft tissue abnormality. IMPRESSION: 1. No acute findings. 2. Multiple gallstones at the neck of the gallbladder without inflammatory changes. 3. Diverticular changes in the descending and  sigmoid colon without evidence of diverticulitis. Electronically signed by: Lonni Necessary MD 03/28/2024 10:47 AM EDT RP Workstation: [id number]   CT Angio Abd/Pel W and/or Wo Contrast Result Date: 03/26/2024 CLINICAL DATA:  Epigastric pain radiating to lower abdomen for 3-4 days, progressive worsening, nausea, history of mesenteric artery stenosis EXAM: CTA ABDOMEN AND PELVIS WITHOUT AND WITH CONTRAST TECHNIQUE: Multidetector CT imaging of the abdomen and pelvis was performed using the standard protocol during bolus administration of intravenous contrast. Multiplanar reconstructed images and MIPs were obtained and reviewed to evaluate the vascular anatomy. RADIATION DOSE REDUCTION: This exam was performed according to the departmental dose-optimization program which includes automated exposure control,  adjustment of the mA and/or kV according to patient size and/or use of iterative reconstruction technique. CONTRAST:  80mL OMNIPAQUE  IOHEXOL  350 MG/ML SOLN COMPARISON:  03/09/2024, 02/24/2024 FINDINGS: VASCULAR Aorta: Normal caliber aorta without aneurysm, dissection, vasculitis or significant stenosis. Diffuse atherosclerosis unchanged. 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 Distal branches of the celiac are patent, with no evidence of aneurysm, dissection, or vasculitis. SMA: There is a stent at the origin of the SMA, which appears widely patent. Distal branches of the SMA are normal, with no evidence of aneurysm, dissection, or vasculitis. Renals: There is significant stenosis at the origin of the bilateral main renal arteries, estimated 70-90% stenosis on the right and 50-70% stenosis on the left. There are 2 additional accessory left renal arteries arising from the aorta, supplying the lower pole left kidney, which appear widely patent throughout their courses. No evidence of aneurysm, dissection, vasculitis, or fibromuscular dysplasia. IMA: The IMA is not severe stenosis at the origin of the IMA, though the distal branches are normal caliber and widely patent, likely via collateral flow. No evidence of aneurysm, dissection, or vasculitis. Inflow: Patent without evidence of aneurysm, dissection, vasculitis or significant stenosis. Stable diffuse atherosclerosis. Proximal Outflow: Pseudoaneurysm of the right common femoral artery is again noted, measuring approximately 2.3 x 2.2 cm, previously measuring 2.2 x 3.0 cm on the 09/22/2023 exam. Significant mural thrombus has developed within the pseudoaneurysm since the prior exams, with a thin patent neck and minimal central flow identified on this study. If further evaluation is clinically indicated, ultrasound could be performed. The bilateral common femoral, superficial femoral, and profundus femoral arteries  are patent. No dissection, vasculitis, or critical stenosis. Diffuse atherosclerosis unchanged. Veins: No obvious venous abnormality within the limitations of this arterial phase study. Review of the MIP images confirms the above findings. NON-VASCULAR Lower chest: No acute pleural or parenchymal lung disease. Hepatobiliary: Calcified gallstones without cholecystitis. Unremarkable appearance of the liver. No biliary duct dilation or choledocholithiasis. Pancreas: Unremarkable. No pancreatic ductal dilatation or surrounding inflammatory changes. Spleen: Normal in size without focal abnormality. Adrenals/Urinary Tract: Mild diffuse bladder wall thickening and perivesicular fat stranding concerning for cystitis. Please correlate with urinalysis. Stable bilateral renal cortical thinning. No abnormal renal enhancement. No urinary tract calculi or obstructive uropathy. The adrenals are stable. Stomach/Bowel: No bowel obstruction or ileus. The appendix is surgically absent. Scattered colonic diverticulosis, most pronounced within the ascending colon, with no evidence of acute diverticulitis. There is no bowel wall thickening or inflammatory change. No CT findings of bowel wall ischemia. Lymphatic: No pathologic adenopathy. Reproductive: Status post hysterectomy. No adnexal masses. Other: No free fluid or free intraperitoneal gas. No abdominal wall hernia. Prior ventral hernia repair. Musculoskeletal: No acute or destructive bony abnormalities. Reconstructed images demonstrate no additional findings. IMPRESSION: VASCULAR 1. Stent within the  origin of the superior mesenteric artery, which appears widely patent. 2. 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 3. 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 Two accessory left renal artery supplying the lower pole are widely patent. 4. Chronic right common femoral  artery pseudoaneurysm, which has decreased in overall size and developed increased mural thrombus since prior exams. A thin patent neck and minimal central enhancement are identified. If further evaluation is clinically indicated, ultrasound could be performed. 5.  Aortic Atherosclerosis (ICD10-I70.0). NON-VASCULAR 1. Mild diffuse bladder wall thickening and perivesicular fat stranding, compatible with cystitis. Please correlate with urinalysis. 2. Cholelithiasis without cholecystitis. 3. Scattered colonic diverticulosis without diverticulitis. Electronically Signed   By: Ozell Daring M.D.   On: 03/26/2024 15:47    Labs:  CBC: Recent Labs    03/26/24 1326 03/27/24 0542 03/28/24 0301 03/29/24 0250  WBC 7.1 6.1 5.9 6.5  HGB 10.6* 10.0* 10.0* 10.1*  HCT 34.0* 31.3* 30.7* 32.1*  PLT 251 258 257 255    COAGS: Recent Labs    04/11/23 0850 03/28/24 1434  INR 1.1 1.0    BMP: Recent Labs    03/26/24 1326 03/27/24 0542 03/28/24 0301 03/29/24 0250  NA 136 134* 134* 134*  K 3.9 4.7 4.7 4.3  CL 97* 100 98 98  CO2 24 25 28 26   GLUCOSE 128* 120* 130* 128*  BUN 15 18 24* 23  CALCIUM 9.9 9.2 9.1 8.8*  CREATININE 0.78 0.97 0.97 0.95  GFRNONAA >60 56* 56* 57*    LIVER FUNCTION TESTS: Recent Labs    02/24/24 1850 03/26/24 1326 03/28/24 0301 03/29/24 0250  BILITOT 0.8 1.0 0.6 0.7  AST 19 18 14* 15  ALT 13 15 13 12   ALKPHOS 90 93 78 68  PROT 6.5 7.2 5.8* 5.7*  ALBUMIN 3.6 4.0 3.0* 3.0*    Assessment and Plan: Acute-on-chronic abdominal pain Patient known to IR from prior SMA stent placement 04/2023 by Dr. Jennefer for mesenteric ischemia.  After overall stability for the past 1 year (not completely symptom-free per her report) she returned to [facility] with acute abdominal pain 03/26/24. Preliminary plan inacted to transfer to [facility] for consideration of celiac artery stenting given imaging findings suggesting patent SMA stent with stable stenosis of the celiac artery identified on  prior CT imaging.   To bedside this AM alongside Dr. Philip to discuss plans for intervention with patient at which time she divulges more relevant information pertinent to her case.  Specifically, she reports she has waxing/waning abdominal pain at baseline and states she does not eat much regularly. She reports some improvement in her abdominal pain after SMA stent placement last year, however this did not last more than a few months. Her pain is not exclusive to PO intake, however she has noticed worsening pain with eating over the past 1 week.   She has had no weight change and, in fact, her weight is up from 160 to 174 lbs now, an improvement over the past year.  Upon physical exam today she has generalized abdominal pain worse across the lower abdomen with rebound tenderness in the RLQ and LLQ.  Of note, she does have urine culture positive for E coli.  She reports she was given a cup of medicine which she believes was supposed to be her antibiotic yesterday but didn't realize what it was and threw it out.  Primary team  made aware.   Upon further review of her imaging she is also noted to have a decreasing/improving R CFA pseudoaneurysm present since her procedure in 04/2023. This is potentially amenable to thrombin injection if desired by patient/medical team, however this further exemplifies the complexity of intervention given her age, history, and comorbidity.   In discussing procedure, goals, benefits, and risks with the patient she indicates that if no guarantee to improved outcome with further interventions, she would elect to go home with hospice.   Overall this AM patient appears fatigued and states multiple times I'm ready to go if God wants to take me.  Given the physical exam findings today, new consideration of R CFA pseudoaneurysm, chronic abdominal pain in the setting of multiple prior abdominal surgeries and mesh, as well as the patient's overall deterioration in the past year  recommend holding on procedure today.   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 Re-evaluate with patient, medical team, and general surgery to determine whether anything, if at all, can be done to improve her quality of life of what is suspected to be acute-on-chronic abdominal pain in the setting of multiple abdominal surgeries and new UTI. 2) Continue aspirin  and plavix  for now.  3) Medical team made aware patient did not take abx for UTI yesterday.  4) IR remains available for thrombin injection (local anesthetic only) if desired by patient/medical team.  5) Consider Palliative consult   She may eat/drink today from IR perspective.    Electronically Signed: Matilde Markie Sue-Ellen Arlen Legendre, PA 03/29/2024, 11:00 AM   I spent a total of 25 Minutes at the the patient's bedside AND on the patient's hospital floor or unit, greater than 50% of which was counseling/coordinating care for abdominal pain.

## 2024-03-29 NOTE — Progress Notes (Signed)
   03/29/24 0856  Vitals  BP (!) 175/67  BP Method Automatic  Patient Position (if appropriate) Lying  Pulse Rate 73  Resp 17  Level of Consciousness  Level of Consciousness Alert  MEWS COLOR  MEWS Score Color Green  Oxygen Therapy  SpO2 95 %  O2 Device Room Air  Pain Assessment  Pain Scale 0-10  Pain Score 0  MEWS Score  MEWS Temp 0  MEWS Systolic 0  MEWS Pulse 0  MEWS RR 0  MEWS LOC 0  MEWS Score 0

## 2024-03-29 NOTE — TOC Initial Note (Signed)
 Transition of Care (TOC) - Initial/Assessment Note   Patient from [facility] . PT recommending HHPT upon return.   NCM called [facility] [phone] spoke to [place] . Inocente unsure if they have in house PT or not. Inocente states someone will call NCM back in 30 minutes.   Spoke to patient . Patient states [facility] does have in house PT and she wants to use them. NCM offered Medicare.gov list of agencies.   Will await return call. Will need orders and face to face to fax to [facility] .   Patient has walker already and has assistance with showering/ bathing  Patient Details  Name: [name] MRN: [id number] Date of Birth: Mar 29, 1932  Transition of [facility]) CM/SW Contact:    Stephane Powell Jansky, RN Phone Number: 03/29/2024, 2:15 PM  Clinical Narrative:                   Expected Discharge Plan: Home w Home Health Services Barriers to Discharge: Continued Medical Work up   Patient Goals and CMS Choice Patient states their goals for this hospitalization and ongoing recovery are:: to return to home CMS Medicare.gov Compare Post Acute Care list provided to:: Patient Choice offered to / list presented to : Patient      Expected Discharge Plan and Services In-house Referral: Clinical Social Work Discharge Planning Services: CM Consult Post Acute Care Choice: Home Health Living arrangements for the past 2 months: Apartment, Assisted Living Facility                 DME Arranged: N/A         HH Arranged:  (see note)          Prior Living Arrangements/Services Living arrangements for the past 2 months: Apartment, Assisted Living Facility Lives with:: Self Patient language and need for interpreter reviewed:: Yes Do you feel safe going back to the place where you live?: Yes      Need for Family Participation in Patient Care: Yes (Comment) Care giver support system in place?: Yes (comment) Current home  services: DME Criminal Activity/Legal Involvement Pertinent to Current Situation/Hospitalization: No - Comment as needed  Activities of Daily Living   ADL Screening (condition at time of admission) Independently performs ADLs?: No Does the patient have a NEW difficulty with bathing/dressing/toileting/self-feeding that is expected to last >3 days?: No Does the patient have a NEW difficulty with getting in/out of bed, walking, or climbing stairs that is expected to last >3 days?: No Does the patient have a NEW difficulty with communication that is expected to last >3 days?: No Is the patient deaf or have difficulty hearing?: No Does the patient have difficulty seeing, even when wearing glasses/contacts?: No Does the patient have difficulty concentrating, remembering, or making decisions?: No  Permission Sought/Granted   Permission granted to share information with : Yes, Verbal Permission Granted     Permission granted to share info w AGENCY: [facility]        Emotional Assessment Appearance:: Appears stated age Attitude/Demeanor/Rapport: Engaged Affect (typically observed): Appropriate Orientation: : Oriented to Self, Oriented to Place, Oriented to  Time, Oriented to Situation Alcohol  / Substance Use: Not Applicable Psych Involvement: No (comment)  Admission diagnosis:  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] Patient Active Problem List   Diagnosis Date Noted   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   Skin rash 03/24/2024   Lesion of  nose 03/24/2024   History of Clostridioides difficile colitis 02/18/2024   Overflow diarrhea 02/18/2024   Abnormal findings on diagnostic imaging of other specified body structures 02/18/2024   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/15/2024   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 01/15/2024   Recurrent UTI 01/15/2024   Type 2 diabetes mellitus with hyperglycemia (HCC) 12/28/2023   UTI (urinary tract  infection) 12/27/2023   Frequency of urination 12/24/2023   Acute blood loss anemia 04/17/2023   Gastritis and gastroduodenitis 04/10/2023   Abdominal pain, chronic, epigastric 04/08/2023   Mesenteric artery stenosis 04/07/2023   Diabetic neuropathy (HCC) 11/30/2022   S/P total knee arthroplasty, right 04/12/2022   Aortic atherosclerosis 02/25/2022   Chronic non-seasonal allergic rhinitis 02/25/2022   GERD (gastroesophageal reflux disease) 02/17/2022   Myalgia due to statin 02/15/2020   Cognitive dysfunction 03/04/2019   Generalized anxiety disorder 06/05/2018   Hyponatremia 03/08/2018   Osteoarthritis of right knee 12/02/2017   Hyperlipidemia associated with type 2 diabetes mellitus (HCC) 10/02/2016   Insomnia 10/02/2016   Essential hypertension 12/15/2014   Type 2 diabetes mellitus with atherosclerosis of aorta (HCC) 02/15/2011   PCP:  [name], MD Pharmacy:   [facility] [place] - [address] [place] [address] Phone: [phone] Fax: [phone]     Social Drivers of Health (SDOH) Social History: SDOH Screenings   Food Insecurity: No Food Insecurity (03/26/2024)  Housing: Low Risk  (03/26/2024)  Transportation Needs: No Transportation Needs (03/26/2024)  Utilities: Not At Risk (03/26/2024)  Alcohol  Screen: Low Risk  (09/06/2022)  Depression (PHQ2-9): Low Risk  (03/03/2024)  Financial Resource Strain: Low Risk  (09/06/2022)  Physical Activity: Sufficiently Active (09/06/2022)  Social Connections: Moderately Isolated (03/26/2024)  Stress: No Stress Concern Present (09/06/2022)  Tobacco Use: Low Risk  (03/26/2024)   SDOH Interventions:     Readmission Risk Interventions    12/28/2023    5:41 PM 09/15/2023    3:04 PM 04/08/2023    2:11 PM  Readmission Risk Prevention Plan  Transportation Screening Complete Complete Complete  PCP or Specialist Appt within 3-5 Days   Not Complete  HRI or Home Care Consult  Complete Complete  Social Work  Consult for Recovery Care Planning/Counseling  Complete Complete  Palliative Care Screening  Not Applicable Not Applicable  Medication Review Oceanographer) Complete Complete Complete  PCP or Specialist appointment within 3-5 days of discharge Complete    HRI or Home Care Consult Complete    SW Recovery Care/Counseling Consult Complete    Palliative Care Screening Not Applicable    Skilled Nursing Facility Not Applicable

## 2024-03-30 DIAGNOSIS — Z66 Do not resuscitate: Secondary | ICD-10-CM | POA: Diagnosis not present

## 2024-03-30 DIAGNOSIS — R531 Weakness: Secondary | ICD-10-CM | POA: Diagnosis not present

## 2024-03-30 DIAGNOSIS — K551 Chronic vascular disorders of intestine: Secondary | ICD-10-CM | POA: Diagnosis not present

## 2024-03-30 DIAGNOSIS — Z515 Encounter for palliative care: Secondary | ICD-10-CM | POA: Diagnosis not present

## 2024-03-30 LAB — MAGNESIUM: Magnesium: 2 mg/dL (ref 1.7–2.4)

## 2024-03-30 LAB — COMPREHENSIVE METABOLIC PANEL WITH GFR
ALT: 11 U/L (ref 0–44)
AST: 21 U/L (ref 15–41)
Albumin: 3.2 g/dL — ABNORMAL LOW (ref 3.5–5.0)
Alkaline Phosphatase: 71 U/L (ref 38–126)
Anion gap: 13 (ref 5–15)
BUN: 19 mg/dL (ref 8–23)
CO2: 26 mmol/L (ref 22–32)
Calcium: 8.9 mg/dL (ref 8.9–10.3)
Chloride: 97 mmol/L — ABNORMAL LOW (ref 98–111)
Creatinine, Ser: 1.07 mg/dL — ABNORMAL HIGH (ref 0.44–1.00)
GFR, Estimated: 49 mL/min — ABNORMAL LOW (ref >60)
Glucose, Bld: 119 mg/dL — ABNORMAL HIGH (ref 70–99)
Potassium: 4.5 mmol/L (ref 3.5–5.1)
Sodium: 136 mmol/L (ref 135–145)
Total Bilirubin: 0.8 mg/dL (ref 0.0–1.2)
Total Protein: 5.9 g/dL — ABNORMAL LOW (ref 6.5–8.1)

## 2024-03-30 LAB — GLUCOSE, CAPILLARY
Glucose-Capillary: 157 mg/dL — ABNORMAL HIGH (ref 70–99)
Glucose-Capillary: 110 mg/dL — ABNORMAL HIGH (ref 70–99)
Glucose-Capillary: 117 mg/dL — ABNORMAL HIGH (ref 70–99)
Glucose-Capillary: 153 mg/dL — ABNORMAL HIGH (ref 70–99)

## 2024-03-30 LAB — CBC
HCT: 33.1 % — ABNORMAL LOW (ref 36.0–46.0)
Hemoglobin: 10 g/dL — ABNORMAL LOW (ref 12.0–15.0)
MCH: 26 pg (ref 26.0–34.0)
MCHC: 30.2 g/dL (ref 30.0–36.0)
MCV: 86 fL (ref 80.0–100.0)
Platelets: 271 K/uL (ref 150–400)
RBC: 3.85 MIL/uL — ABNORMAL LOW (ref 3.87–5.11)
RDW: 14.4 % (ref 11.5–15.5)
WBC: 6.6 K/uL (ref 4.0–10.5)
nRBC: 0 % (ref 0.0–0.2)

## 2024-03-30 MED ORDER — IRBESARTAN 150 MG PO TABS
150.0000 mg | ORAL_TABLET | Freq: Every day | ORAL | Status: DC
Start: 2024-03-30 — End: 2024-04-02
  Administered 2024-03-31 – 2024-04-02 (×3): 150 mg via ORAL
  Filled 2024-03-30 (×3): qty 1

## 2024-03-30 NOTE — Plan of Care (Signed)
  Problem: Education: Goal: Ability to describe self-care measures that may prevent or decrease complications (Diabetes Survival Skills Education) will improve Outcome: Progressing   Problem: Coping: Goal: Ability to adjust to condition or change in health will improve Outcome: Progressing   Problem: Activity: Goal: Risk for activity intolerance will decrease Outcome: Progressing   Problem: Nutrition: Goal: Adequate nutrition will be maintained Outcome: Not Progressing

## 2024-03-30 NOTE — Progress Notes (Signed)
 Referring Physician(s): Dr. Trixie  Supervising Physician: Philip Cornet  Patient Status:  [facility] - In-pt  Chief Complaint: Abdominal pain  Subjective: Lying in bed, fatigued.  Complains of ongoing abdominal pain not completely relieved by IV pain medication and constipation. She continues to be disinterested in eating.   Brief History: Patient known to IR from prior SMA stent placement 04/2023 by Dr. Jennefer for mesenteric ischemia.  After overall stability for the past 1 year (not completely symptom-free per her report) she returned to [facility] ED with acute abdominal pain 03/26/24. 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  She experienced some improvement in her abdominal pain after SMA stent placement last year, however this did not last more than a few months. IR thus far has not recommended any further recommendation regarding stenosis.  She was seen by [facility] yesterday; no concern for bowel obstruction or mesh related complication. [facility] recommendations included considering GI consult for upper endoscopy and/or vascular surgery consult. Of note, she does have urine culture positive for E coli; she is on abx.  Allergies: Statins, Phenergan [promethazine hcl], Hydrocortisone, Lisinopril, Prednisone , and Trazodone  and nefazodone  Medications: Prior to Admission medications   Medication Sig Start Date End Date Taking? Authorizing Provider  acetaminophen  (TYLENOL ) 325 MG tablet TAKE (2) TABLETS BY MOUTH EVERY SIX HOURS AS NEEDED FOR PAIN. MAX 3GM IN 24 HOURS. 07/28/23  Yes [name], MD  albuterol  (VENTOLIN  HFA) 108 (90 Base) MCG/ACT inhaler INHALE 2 PUFFS INTO THE LUNGS EVERY SIX HOURS AS NEEDED FOR WHEEZING OR SHORTNESS OF BREATH. 09/26/23  Yes Luking, Scott A, MD  ALPRAZolam  (XANAX ) 0.25 MG tablet TAKE (1) TABLET BY MOUTH  AT BEDTIME FOR SLEEP. 02/25/24  Yes Luking, Scott A, MD  amLODipine  (NORVASC ) 10 MG tablet TAKE (1) TABLET BY MOUTH ONCE DAILY. 10/07/23  Yes Luking, Glendia LABOR, MD  aspirin  (ASPIRIN  LOW DOSE) 81 MG chewable tablet CHEW (1) TABLET BY MOUTH ONCE DAILY. 09/06/23  Yes Luking, Glendia LABOR, MD  BETA CAROTENE PROVITAMIN A 25000 units capsule TAKE (1) CAPSULE BY MOUTH AT BEDTIME. 02/24/24  Yes Luking, Scott A, MD  BISACODYL  5 MG EC tablet TAKE 1 TABLET BY MOUTH ONCE DAILY AS NEEDED FOR MODERATE CONSTIPATION. 12/20/22  Yes [name], MD  cyanocobalamin  1000 MCG tablet Take 1,000 mcg by mouth at bedtime.   Yes [provider]  cycloSPORINE  (RESTASIS ) 0.05 % ophthalmic emulsion Place 1 drop into both eyes 2 (two) times daily.   Yes [provider]  estradiol  (ESTRACE ) 0.1 MG/GM vaginal cream Discard plastic applicator. Insert a blueberry size amount (approximately 1 gram) of cream on fingertip inside vagina at bedtime every night for 1 week then every other night. For long term use. 01/15/24  Yes Larocco, Lauraine BROCKS, FNP  gabapentin  (NEURONTIN ) 100 MG capsule 1 nightly for neuropathy Patient taking differently: Take 100 mg by mouth in the morning. 03/08/24  Yes [name], MD  hydrALAZINE  (APRESOLINE ) 25 MG tablet Take 1 tablet (25 mg total) by mouth 3 (three) times daily. 12/31/23  Yes Luking, Glendia LABOR, MD  IVIZIA DRY EYES 0.5 % SOLN Place 1 drop into both eyes 2 (two) times daily. 09/22/23  Yes [provider]  Melatonin 10 MG TABS Take 10 mg by mouth at bedtime. 03/24/24  Yes Grooms, [place], PA-C  METAMUCIL 4 IN 1  FIBER 25 % PACK Take by mouth 2 (two) times daily. 03/25/24  Yes [provider]  metFORMIN  (GLUCOPHAGE ) 500 MG tablet TAKE (1) TABLET BY MOUTH IN THE MORNING & (1/2) TABLET (250MG ) BY MOUTH AT SUPPER 10/07/23  Yes Luking, Glendia LABOR, MD  methenamine  (HIPREX ) 1 g tablet Take 1 tablet (1 g total) by mouth 2 (two) times daily with a meal. Most effective when taken with a daily  Vitamin C supplement. 01/15/24  Yes Gerldine Lauraine BROCKS, FNP  Multiple Vitamins-Minerals (PRESERVISION AREDS 2) CAPS Take 1 capsule by mouth in the morning and at bedtime.   Yes [provider]  mupirocin  ointment (BACTROBAN ) 2 % Apply thin amount twice daily to rash in groin creases twice daily for 5 days at a time when rash occurs 03/07/24  Yes Luking, Glendia LABOR, MD  ondansetron  (ZOFRAN -ODT) 4 MG disintegrating tablet 4mg  ODT q4 hours prn nausea/vomit 09/05/23  Yes Zammit, Joseph, MD  pantoprazole  (PROTONIX ) 40 MG tablet Take 1 tablet (40 mg total) by mouth 2 (two) times daily before a meal. 12/24/23  Yes Cook, Jayce G, DO  polyethylene glycol powder (GOODSENSE CLEARLAX) 17 GM/SCOOP powder 1 scoop in 8 ounces water  once daily as needed for constipation 06/03/23  Yes Luking, Scott A, MD  sodium chloride  1 g tablet TAKE (1) TABLET BY MOUTH TWICE DAILY WITH A MEAL. 01/26/24  Yes Luking, Scott A, MD  tamsulosin  (FLOMAX ) 0.4 MG CAPS capsule Take 1 capsule (0.4 mg total) by mouth daily. 03/08/24  Yes Dahlstedt, Garnette, MD  valsartan  (DIOVAN ) 320 MG tablet Take 1 tablet (320 mg total) by mouth daily. 12/22/23  Yes Luking, Glendia LABOR, MD  Vitamin D , Cholecalciferol , 10 MCG (400 UNIT) TABS TAKE (1) TABLET BY MOUTH ONCE DAILY. 10/07/23  Yes Luking, Scott A, MD  glucose blood (EASYMAX TEST) test strip CHECK BLOOD SUGAR ONCE DAILY.(CALL MD IF BS BELOW 60: OR IF BS ABOVE 400) 10/07/23   Luking, Glendia LABOR, MD  ketoconazole  (NIZORAL ) 2 % cream Apply 1 Application topically 2 (two) times daily. For 10 days to groin rash Patient not taking: Reported on 03/26/2024 03/03/24   [name], MD  Safety Lancets 28G MISC CHECK BLOOD SUGAR ONCE DAILY. 01/21/24   [name], MD     Vital Signs: BP (!) 188/65   Pulse 74   Temp 97.6 F (36.4 C) (Oral)   Resp 18   Ht 5' 8 (1.727 m)   Wt 174 lb 2.6 oz (79 kg)   SpO2 96%   BMI 26.48 kg/m   Physical Exam Constitutional:      Appearance: She is ill-appearing.   Cardiovascular:     Comments: R groin site is nontender, pseudoaneurysm not palpable, no ecchymosis noted Pulmonary:     Effort: Pulmonary effort is normal.  Abdominal:     Palpations: Abdomen is soft.     Tenderness: There is abdominal tenderness.     Comments: Exquisite generalized tenderness even with light palpation, appears worse RLQ and suprapubic region  Skin:    General: Skin is warm and dry.  Neurological:     Mental Status: She is alert.  Psychiatric:        Mood and Affect: Mood is depressed.      Imaging: CT ABDOMEN PELVIS WO CONTRAST Result Date: 03/28/2024 EXAM: CT ABDOMEN AND PELVIS WITHOUT CONTRAST 03/28/2024 10:10:16 AM TECHNIQUE: CT of the abdomen and pelvis was performed without the administration of intravenous contrast. Multiplanar reformatted images are provided for review.  Automated exposure control, iterative reconstruction, and/or weight-based adjustment of the mA/kV was utilized to reduce the radiation dose to as low as reasonably achievable. COMPARISON: CT angio abdomen and pelvis with contrast 03/26/2024. CLINICAL HISTORY: Abdominal pain, acute, nonlocalized; worsening abdominal pain. FINDINGS: LOWER CHEST: No acute abnormality. LIVER: A small cyst or hemangioma near the dome of the liver is stable. GALLBLADDER AND BILE DUCTS: Multiple gallstones are again noted at the neck of the gallbladder. No inflammatory changes or associated. No biliary ductal dilatation. SPLEEN: No acute abnormality. PANCREAS: No acute abnormality. ADRENAL GLANDS: No acute abnormality. KIDNEYS, URETERS AND BLADDER: No stones in the kidneys or ureters. No hydronephrosis. No perinephric or periureteral stranding. Mild bladder wall thickening is less prominent than on the previous study. GI AND BOWEL: Stomach demonstrates no acute abnormality. Diverticular changes are again noted throughout the descending and sigmoid colon without focal inflammation to suggest diverticulitis. There is no bowel  obstruction. PERITONEUM AND RETROPERITONEUM: No ascites. No free air. VASCULATURE: Extensive atherosclerotic changes are again noted in the aorta. Stent at the origin of the SMA is noted. LYMPH NODES: No lymphadenopathy. REPRODUCTIVE ORGANS: No acute abnormality. BONES AND SOFT TISSUES: Ventral hernia repair. No acute osseous abnormality. No focal soft tissue abnormality. IMPRESSION: 1. No acute findings. 2. Multiple gallstones at the neck of the gallbladder without inflammatory changes. 3. Diverticular changes in the descending and sigmoid colon without evidence of diverticulitis. Electronically signed by: Lonni Necessary MD 03/28/2024 10:47 AM EDT RP Workstation: [id number]   CT Angio Abd/Pel W and/or Wo Contrast Result Date: 03/26/2024 CLINICAL DATA:  Epigastric pain radiating to lower abdomen for 3-4 days, progressive worsening, nausea, history of mesenteric artery stenosis EXAM: CTA ABDOMEN AND PELVIS WITHOUT AND WITH CONTRAST TECHNIQUE: Multidetector CT imaging of the abdomen and pelvis was performed using the standard protocol during bolus administration of intravenous contrast. Multiplanar reconstructed images and MIPs were obtained and reviewed to evaluate the vascular anatomy. RADIATION DOSE REDUCTION: This exam was performed according to the departmental dose-optimization program which includes automated exposure control, adjustment of the mA and/or kV according to patient size and/or use of iterative reconstruction technique. CONTRAST:  80mL OMNIPAQUE  IOHEXOL  350 MG/ML SOLN COMPARISON:  03/09/2024, 02/24/2024 FINDINGS: VASCULAR Aorta: Normal caliber aorta without aneurysm, dissection, vasculitis or significant stenosis. Diffuse atherosclerosis unchanged. 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 Distal branches of the celiac are patent, with no evidence of aneurysm, dissection, or vasculitis. SMA: There is a stent at the origin of the SMA, which  appears widely patent. Distal branches of the SMA are normal, with no evidence of aneurysm, dissection, or vasculitis. Renals: There is significant stenosis at the origin of the bilateral main renal arteries, estimated 70-90% stenosis on the right and 50-70% stenosis on the left. There are 2 additional accessory left renal arteries arising from the aorta, supplying the lower pole left kidney, which appear widely patent throughout their courses. No evidence of aneurysm, dissection, vasculitis, or fibromuscular dysplasia. IMA: The IMA is not severe stenosis at the origin of the IMA, though the distal branches are normal caliber and widely patent, likely via collateral flow. No evidence of aneurysm, dissection, or vasculitis. Inflow: Patent without evidence of aneurysm, dissection, vasculitis or significant stenosis. Stable diffuse atherosclerosis. Proximal Outflow: Pseudoaneurysm of the right common femoral artery is again noted, measuring approximately 2.3 x 2.2 cm, previously measuring 2.2 x 3.0 cm on the 09/22/2023 exam. Significant mural thrombus has developed within the pseudoaneurysm since the prior exams,  with a thin patent neck and minimal central flow identified on this study. If further evaluation is clinically indicated, ultrasound could be performed. The bilateral common femoral, superficial femoral, and profundus femoral arteries are patent. No dissection, vasculitis, or critical stenosis. Diffuse atherosclerosis unchanged. Veins: No obvious venous abnormality within the limitations of this arterial phase study. Review of the MIP images confirms the above findings. NON-VASCULAR Lower chest: No acute pleural or parenchymal lung disease. Hepatobiliary: Calcified gallstones without cholecystitis. Unremarkable appearance of the liver. No biliary duct dilation or choledocholithiasis. Pancreas: Unremarkable. No pancreatic ductal dilatation or surrounding inflammatory changes. Spleen: Normal in size without  focal abnormality. Adrenals/Urinary Tract: Mild diffuse bladder wall thickening and perivesicular fat stranding concerning for cystitis. Please correlate with urinalysis. Stable bilateral renal cortical thinning. No abnormal renal enhancement. No urinary tract calculi or obstructive uropathy. The adrenals are stable. Stomach/Bowel: No bowel obstruction or ileus. The appendix is surgically absent. Scattered colonic diverticulosis, most pronounced within the ascending colon, with no evidence of acute diverticulitis. There is no bowel wall thickening or inflammatory change. No CT findings of bowel wall ischemia. Lymphatic: No pathologic adenopathy. Reproductive: Status post hysterectomy. No adnexal masses. Other: No free fluid or free intraperitoneal gas. No abdominal wall hernia. Prior ventral hernia repair. Musculoskeletal: No acute or destructive bony abnormalities. Reconstructed images demonstrate no additional findings. IMPRESSION: VASCULAR 1. Stent within the origin of the superior mesenteric artery, which appears widely patent. 2. 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 3. 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 Two accessory left renal artery supplying the lower pole are widely patent. 4. Chronic right common femoral artery pseudoaneurysm, which has decreased in overall size and developed increased mural thrombus since prior exams. A thin patent neck and minimal central enhancement are identified. If further evaluation is clinically indicated, ultrasound could be performed. 5.  Aortic Atherosclerosis (ICD10-I70.0). NON-VASCULAR 1. Mild diffuse bladder wall thickening and perivesicular fat stranding, compatible with cystitis. Please correlate with urinalysis. 2. Cholelithiasis without cholecystitis. 3. Scattered colonic diverticulosis without diverticulitis. Electronically Signed   By: Ozell Daring M.D.   On: 03/26/2024 15:47    Labs:  CBC: Recent Labs    03/27/24 0542 03/28/24 0301 03/29/24 0250 03/30/24 0216  WBC 6.1 5.9 6.5 6.6  HGB 10.0* 10.0* 10.1* 10.0*  HCT 31.3* 30.7* 32.1* 33.1*  PLT 258 257 255 271    COAGS: Recent Labs    04/11/23 0850 03/28/24 1434  INR 1.1 1.0    BMP: Recent Labs    03/27/24 0542 03/28/24 0301 03/29/24 0250 03/30/24 0216  NA 134* 134* 134* 136  K 4.7 4.7 4.3 4.5  CL 100 98 98 97*  CO2 25 28 26 26   GLUCOSE 120* 130* 128* 119*  BUN 18 24* 23 19  CALCIUM 9.2 9.1 8.8* 8.9  CREATININE 0.97 0.97 0.95 1.07*  GFRNONAA 56* 56* 57* 49*    LIVER FUNCTION TESTS: Recent Labs    03/26/24 1326 03/28/24 0301 03/29/24 0250 03/30/24 0216  BILITOT 1.0 0.6 0.7 0.8  AST 18 14* 15 21  ALT 15 13 12 11   ALKPHOS 93 78 68 71  PROT 7.2 5.8* 5.7* 5.9*  ALBUMIN 4.0 3.0* 3.0* 3.2*    Assessment and Plan: Acute-on-chronic abdominal pain Patient reevaluated at bedside personally by Dr. Philip today. Maintain recommendation not to pursue additional angioplasty/stent procedure (see IR progress note from 9/29 for previous  discussion notes). Case also discussed with other providers on IR team with consensus that patient is unlikely to benefit from additional procedure related to stenosis.   Also reassessed decreasing/improving R CFA pseudoaneurysm present since her procedure in 04/2023. Largely asymptomatic; however, Dr. Philip approves offering thrombin injection if desired by patient/medical team. The patient is apathetic about pursuing this, states that the area is not bothering her. Rather her abd pain is her primary concern. Palliative care has been consulted and has not yet seen patient. This discussion may help patient with decision making for thrombin injection.   If patient/medical team decides would like thrombin injection of R CFA pseudoaneurysm, this would be a local anesthesia case and could be done as soon as this afternoon.   She may  eat/drink today from IR perspective.    Electronically Signed: Laymon Coast, NP 03/30/2024, 12:22 PM   I spent a total of 25 Minutes at the the patient's bedside AND on the patient's hospital floor or unit, greater than 50% of which was counseling/coordinating care for abdominal pain.

## 2024-03-30 NOTE — Progress Notes (Signed)
 1 Went into patient's room to check on her and her Unasyn was not infusing. Less than half a bag had been infused (started at 2137). Restarted the infusion at 0045 and notified pharmacy. Rutha will adjust future doses.

## 2024-03-30 NOTE — Progress Notes (Signed)
 PROGRESS NOTE  [name] FMW:[id number] DOB: [birth year] DOA: 03/26/2024 PCP: [name], MD   LOS: 4 days   Brief Narrative / Interim history: 89 y.o. female, with medical history significant of hypertension, hyperlipidemia, T2DM, GERD mesenteric artery stenosis and stent placement, history of urinary retention with recurrent UTIs, she lives at ALF, came into the hospital with abdominal pain, nausea, poor appetite.  She has a history of mesenteric ischemia with prior SMA stent.  He has a history of mesenteric underwent a CT angiogram which showed patent SMA stent, however celiac artery stenosis.  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  Admitting MD discussed with IR Dr. Luverne and patient was transferred from [facility] to [facility].  Dr. Philip evaluated patient yesterday, did not think that the patient's pain is related to chronic mesenteric ischemia recommending surgical opinion - General Surgery consulted and evaluated patient 9/29, feeling that her pain could not be caused by her prior particular history/mesh presence, and recommended evaluation for chronic mesenteric ischemia -IR to see again today and weigh in, for now planning treatment of the right groin pseudoaneurysm with thrombin injection   Active problems UTI-patient with urinary symptoms, supposed to have received fosfomycin x 1 on 9/28, but I later learned that she did not.  Urine cultures with pansensitive E. coli, started Unasyn yesterday, plan for total of 3 days - Still with discomfort this morning, check bladder  scan  DM2-controlled, A1c was 6.3 just recently.  Keep on sliding scale.  Hold metformin .  CBGs reviewed and acceptable  CBG (last 3)  Recent Labs    03/29/24 1652 03/29/24 2130 03/30/24 0832  GLUCAP 118* 182* 157*   Right groin pseudoaneurysm-will have thrombin injection by IR today  Essential hypertension-continue amlodipine , hydralazine , pressure still high, resume ARB today  History of C. difficile-monitor while on diuretics  Generalized anxiety disorder-continue Xanax   Goals of care-patient is DNR/DNI, was asking me few days ago about being home with hospice, and also that she is not too keen in having too many more procedures in the future.  Palliative care consulted, appreciate input  Scheduled Meds:  amLODipine   10 mg Oral Daily   artificial tears  1 drop Both Eyes BID   aspirin   81 mg Oral Daily   clopidogrel   75 mg Oral Daily   cyanocobalamin   1,000 mcg Oral QHS   cycloSPORINE   1 drop Both Eyes BID   docusate sodium   100 mg Oral BID   enoxaparin  (LOVENOX ) injection  40 mg Subcutaneous Q24H   estradiol   1 Applicatorful Vaginal QHS   feeding supplement (GLUCERNA SHAKE)  237 mL Oral TID BM   gabapentin   100 mg Oral q AM   hydrALAZINE   25 mg Oral TID   insulin  aspart  0-9 Units Subcutaneous TID WC   melatonin  9 mg Oral QHS   methenamine   1 g Oral BID WC   multivitamin  1 tablet Oral Daily   pantoprazole   40 mg Oral BID AC   tamsulosin   0.4 mg Oral Daily   Continuous Infusions:  ampicillin-sulbactam (UNASYN) IV 1.5 g (03/30/24 0905)    PRN Meds:.acetaminophen  **OR**  acetaminophen , albuterol , ALPRAZolam , bisacodyl , hydrALAZINE , morphine  injection, ondansetron  **OR** ondansetron  (ZOFRAN ) IV, polyethylene glycol, vitamin A & D  Current Outpatient Medications  Medication Instructions   acetaminophen  (TYLENOL ) 325 MG tablet TAKE (2) TABLETS BY MOUTH EVERY SIX HOURS AS NEEDED FOR PAIN. MAX 3GM IN 24 HOURS.   albuterol  (VENTOLIN  HFA) 108 (90 Base) MCG/ACT inhaler 2  puffs, Inhalation, Every 6 hours PRN   ALPRAZolam  (XANAX ) 0.25 MG tablet TAKE (1) TABLET BY MOUTH AT BEDTIME FOR SLEEP.   amLODipine  (NORVASC ) 10 MG tablet TAKE (1) TABLET BY MOUTH ONCE DAILY.   aspirin  (ASPIRIN  LOW DOSE) 81 MG chewable tablet CHEW (1) TABLET BY MOUTH ONCE DAILY.   BETA CAROTENE PROVITAMIN A 25000 units capsule TAKE (1) CAPSULE BY MOUTH AT BEDTIME.   BISACODYL  5 MG EC tablet TAKE 1 TABLET BY MOUTH ONCE DAILY AS NEEDED FOR MODERATE CONSTIPATION.   cyanocobalamin  1,000 mcg, Daily at bedtime   cycloSPORINE  (RESTASIS ) 0.05 % ophthalmic emulsion 1 drop, 2 times daily   estradiol  (ESTRACE ) 0.1 MG/GM vaginal cream Discard plastic applicator. Insert a blueberry size amount (approximately 1 gram) of cream on fingertip inside vagina at bedtime every night for 1 week then every other night. For long term use.   gabapentin  (NEURONTIN ) 100 MG capsule 1 nightly for neuropathy   glucose blood (EASYMAX TEST) test strip CHECK BLOOD SUGAR ONCE DAILY.(CALL MD IF BS BELOW 60: OR IF BS ABOVE 400)   hydrALAZINE  (APRESOLINE ) 25 mg, Oral, 3 times daily   IVIZIA DRY EYES 0.5 % SOLN 1 drop, 2 times daily   ketoconazole  (NIZORAL ) 2 % cream 1 Application, Topical, 2 times daily, For 10 days to groin rash   Melatonin 10 mg, Oral, Daily at bedtime   METAMUCIL 4 IN 1 FIBER 25 % PACK Oral, 2 times daily   metFORMIN  (GLUCOPHAGE ) 500 MG tablet TAKE (1) TABLET BY MOUTH IN THE MORNING & (1/2) TABLET (250MG ) BY MOUTH AT SUPPER   methenamine  (HIPREX ) 1 g, Oral, 2 times daily with meals, Most effective when taken with a daily Vitamin C supplement.   Multiple Vitamins-Minerals (PRESERVISION AREDS 2) CAPS 1 capsule, 2 times daily   mupirocin  ointment (BACTROBAN ) 2 % Apply thin amount twice daily to rash in groin creases twice daily for 5 days at a time when rash occurs   ondansetron  (ZOFRAN -ODT) 4 MG disintegrating tablet 4mg  ODT q4 hours prn nausea/vomit   pantoprazole  (PROTONIX ) 40 mg, Oral, 2 times daily before  meals   polyethylene glycol powder (GOODSENSE CLEARLAX) 17 GM/SCOOP powder 1 scoop in 8 ounces water  once daily as needed for constipation   Safety Lancets 28G MISC CHECK BLOOD SUGAR ONCE DAILY.   sodium chloride  1 g tablet TAKE (1) TABLET BY MOUTH TWICE DAILY WITH A MEAL.   tamsulosin  (FLOMAX ) 0.4 mg, Oral, Daily   valsartan  (DIOVAN ) 320 mg, Oral, Daily   Vitamin D , Cholecalciferol , 10 MCG (400 UNIT) TABS TAKE (1) TABLET BY MOUTH ONCE DAILY.    Diet Orders (From admission, onward)     Start     Ordered   03/30/24 0001  Diet NPO time specified  Diet effective midnight        03/29/24 1649            DVT prophylaxis: enoxaparin  (LOVENOX ) injection 40 mg Start: 03/26/24 2200 SCDs Start: 03/26/24 1925   Lab Results  Component Value Date   PLT 271 03/30/2024      Code Status: Limited: Do not attempt resuscitation (DNR) -DNR-LIMITED -Do Not Intubate/DNI  Family Communication: No family at bedside  Status is: Inpatient Remains inpatient appropriate because: Severity of illness  Level of care: Telemetry Medical  Consultants:  Interventional radiology  Objective: Vitals:   03/29/24 2132 03/30/24 0508 03/30/24 0837 03/30/24 0907  BP: (!) 151/55 (!) 158/56 (!) 188/65 (!) 188/65  Pulse: 81 72 74   Resp: 17 19 18    Temp: 97.8 F (36.6 C) 98 F (36.7 C) 97.6 F (36.4 C)   TempSrc: Oral Oral Oral   SpO2: 99% 95% 96%   Weight:      Height:        Intake/Output Summary (Last 24 hours) at 03/30/2024 1006 Last data filed at 03/29/2024 1200 Gross per 24 hour  Intake 0 ml  Output --  Net 0 ml   Wt Readings from Last 3 Encounters:  03/26/24 79 kg  03/24/24 79.8 kg  03/03/24 78.5 kg    Examination: Constitutional: NAD Eyes: lids and conjunctivae normal, no scleral icterus ENMT: mmm Neck: normal, supple Respiratory: clear to auscultation bilaterally, no wheezing, no crackles. Normal respiratory effort.  Cardiovascular: Regular rate and rhythm, no murmurs / rubs /  gallops. No LE edema. Abdomen: soft, very mild tenderness midepigastric area, no guarding or rebound  Data Reviewed: I have independently reviewed following labs and imaging studies   CBC Recent Labs  Lab 03/26/24 1326 03/27/24 0542 03/28/24 0301 03/29/24 0250 03/30/24 0216  WBC 7.1 6.1 5.9 6.5 6.6  HGB 10.6* 10.0* 10.0* 10.1* 10.0*  HCT 34.0* 31.3* 30.7* 32.1* 33.1*  PLT 251 258 257 255 271  MCV 87.6 87.2 86.0 87.7 86.0  MCH 27.3 27.9 28.0 27.6 26.0  MCHC 31.2 31.9 32.6 31.5 30.2  RDW 14.1 14.2 14.1 14.3 14.4  LYMPHSABS 1.8  --   --   --   --   MONOABS 0.7  --   --   --   --   EOSABS 0.1  --   --   --   --   BASOSABS 0.0  --   --   --   --     Recent Labs  Lab 03/26/24 1326 03/27/24 0542 03/28/24 0301 03/28/24 1434 03/29/24 0250 03/30/24 0216  NA 136 134* 134*  --  134* 136  K 3.9 4.7 4.7  --  4.3 4.5  CL 97* 100 98  --  98 97*  CO2 24 25 28   --  26 26  GLUCOSE 128* 120* 130*  --  128* 119*  BUN 15 18 24*  --  23 19  CREATININE 0.78 0.97 0.97  --  0.95 1.07*  CALCIUM 9.9 9.2 9.1  --  8.8* 8.9  AST 18  --  14*  --  15 21  ALT 15  --  13  --  12 11  ALKPHOS 93  --  78  --  68 71  BILITOT 1.0  --  0.6  --  0.7 0.8  ALBUMIN 4.0  --  3.0*  --  3.0* 3.2*  MG  --   --  1.9  --  1.8 2.0  INR  --   --   --  1.0  --   --     ------------------------------------------------------------------------------------------------------------------ No results for input(s): CHOL, HDL, LDLCALC, TRIG, CHOLHDL, LDLDIRECT in the last 72 hours.   Lab Results  Component Value Date   HGBA1C 6.3 (H) 12/26/2023   ------------------------------------------------------------------------------------------------------------------ No results for input(s): TSH, T4TOTAL, T3FREE, THYROIDAB in the last 72 hours.  Invalid input(s):  FREET3  Cardiac Enzymes No results for input(s): CKMB, TROPONINI, MYOGLOBIN in the last 168 hours.  Invalid input(s):  CK ------------------------------------------------------------------------------------------------------------------    Component Value Date/Time   BNP 88.0 09/22/2023 1848    CBG: Recent Labs  Lab 03/29/24 0803 03/29/24 1138 03/29/24 1652 03/29/24 2130 03/30/24 0832  GLUCAP 132* 153* 118* 182* 157*    Recent Results (from the past 240 hours)  Urine Culture     Status: Abnormal   Collection Time: 03/26/24  3:56 PM   Specimen: Urine, Clean Catch  Result Value Ref Range Status   Specimen Description   Final    URINE, CLEAN CATCH Performed at [facility], [address], [place], [place] [address]    Special Requests   Final    NONE Performed at [facility], [address], [place], [place] [address]    Culture >=100,000 COLONIES/mL ESCHERICHIA COLI (A)  Final   Report Status 03/29/2024 FINAL  Final   Organism ID, Bacteria ESCHERICHIA COLI (A)  Final      Susceptibility   Escherichia coli - MIC*    AMPICILLIN <=2 SENSITIVE Sensitive     CEFAZOLIN  (URINE) Value in next row Sensitive      <=1 SENSITIVEThis is a modified FDA-approved test that has been validated and its performance characteristics determined by the reporting laboratory.  This laboratory is certified under the Clinical Laboratory Improvement Amendments CLIA as qualified to perform high complexity clinical laboratory testing.    CEFEPIME  Value in next row Sensitive      <=1 SENSITIVEThis is a modified FDA-approved test that has been validated and its performance characteristics determined by the reporting laboratory.  This laboratory is certified under the Clinical Laboratory Improvement Amendments CLIA as qualified to perform high complexity clinical laboratory testing.    ERTAPENEM Value in next row Sensitive      <=1 SENSITIVEThis is a modified FDA-approved test that has been validated and its performance characteristics determined by the reporting laboratory.  This laboratory is certified under the Clinical  Laboratory Improvement Amendments CLIA as qualified to perform high complexity clinical laboratory testing.    CEFTRIAXONE  Value in next row Sensitive      <=1 SENSITIVEThis is a modified FDA-approved test that has been validated and its performance characteristics determined by the reporting laboratory.  This laboratory is certified under the Clinical Laboratory Improvement Amendments CLIA as qualified to perform high complexity clinical laboratory testing.    CIPROFLOXACIN  Value in next row Sensitive      <=1 SENSITIVEThis is a modified FDA-approved test that has been validated and its performance characteristics determined by the reporting laboratory.  This laboratory is certified under the Clinical Laboratory Improvement Amendments CLIA as qualified to perform high complexity clinical laboratory testing.    GENTAMICIN Value in next row Sensitive      <=1 SENSITIVEThis is a modified FDA-approved test that has been validated and its performance characteristics determined by the reporting laboratory.  This laboratory is certified under the Clinical Laboratory Improvement Amendments CLIA as qualified to perform high complexity clinical laboratory testing.    NITROFURANTOIN  Value in next row Sensitive      <=1 SENSITIVEThis is a modified FDA-approved test that has been validated and its performance characteristics determined by the reporting laboratory.  This laboratory is certified under the Clinical Laboratory Improvement Amendments CLIA as qualified to perform high complexity clinical laboratory testing.    TRIMETH /SULFA  Value in next row Sensitive      <=1 SENSITIVEThis is a modified  FDA-approved test that has been validated and its performance characteristics determined by the reporting laboratory.  This laboratory is certified under the Clinical Laboratory Improvement Amendments CLIA as qualified to perform high complexity clinical laboratory testing.    AMPICILLIN/SULBACTAM Value in next row  Sensitive      <=1 SENSITIVEThis is a modified FDA-approved test that has been validated and its performance characteristics determined by the reporting laboratory.  This laboratory is certified under the Clinical Laboratory Improvement Amendments CLIA as qualified to perform high complexity clinical laboratory testing.    PIP/TAZO Value in next row Sensitive      <=4 SENSITIVEThis is a modified FDA-approved test that has been validated and its performance characteristics determined by the reporting laboratory.  This laboratory is certified under the Clinical Laboratory Improvement Amendments CLIA as qualified to perform high complexity clinical laboratory testing.    MEROPENEM Value in next row Sensitive      <=4 SENSITIVEThis is a modified FDA-approved test that has been validated and its performance characteristics determined by the reporting laboratory.  This laboratory is certified under the Clinical Laboratory Improvement Amendments CLIA as qualified to perform high complexity clinical laboratory testing.    * >=100,000 COLONIES/mL ESCHERICHIA COLI     Radiology Studies: No results found.     Nilda Fendt, MD, PhD [facility]  Between 7 am - 7 pm I am available, please contact me via Amion (for emergencies) or Securechat (non urgent messages)  Between 7 pm - 7 am I am not available, please contact night coverage MD/APP via Amion

## 2024-03-30 NOTE — Progress Notes (Signed)
 Patient ID: [name], female   DOB: February 27, 1932, 89 y.o.   MRN: [id number]    Progress Note from the Palliative Medicine Team at [facility]   Patient Name: [name]        Date: 03/30/2024 DOB: 10-Mar-1932  Age: 89 y.o. MRN#: [id number] Attending Physician: Linda Nilda HERO, MD Primary Care Physician: Linda Glendia LABOR, MD Admit Date: 03/26/2024   Reason for Consultation/Follow-up   Establishing Goals of Care   HPI/ Brief Hospital Review  89 y.o. female, with medical history significant of hypertension, hyperlipidemia, T2DM, GERD mesenteric artery stenosis and stent placement, history of urinary retention with recurrent UTIs, she lives at ALF, came into the hospital with abdominal pain, nausea, poor appetite.   She has a history of mesenteric ischemia with prior SMA stent. He has a history of mesenteric underwent a CT angiogram which showed patent SMA stent, however celiac artery stenosis.   She failed conservative management due to significant pain with eating, and was transferred from [facility] to [facility] for IR evaluation   Patient known to IR  from prior SMA stent placement 04/2023 by [name] for mesenteric ischemia.  After overall stability for the past 1 year (not completely symptom-free per her report) she returned to [facility] ED with acute abdominal pain 03/26/24. Preliminary plan inacted to transfer to [facility] for consideration of celiac artery stenting given imaging findings suggesting patent SMA stent with stable stenosis of the celiac artery identified on prior CT imaging.   She experienced some improvement in her abdominal pain after SMA stent placement last year, however this did not last more than a few months. IR thus far has not recommended any further recommendation regarding stenosis.   She was seen by [facility]; no concern for bowel obstruction or mesh related complication. [facility] recommendations included considering GI consult for upper endoscopy and/or vascular surgery  consult.  Patient recognizes overall slow physical and functional decline, she has had multiple rehospitalization's and ER visits.  Patient and her son face treatment option decisions, advanced directive decisions and anticipatory care needs.   Subjective  Extensive chart review has been completed prior to meeting with patient  including labs, vital signs, imaging, progress/consult notes, orders, medications and available advance directive documents.    This NP assessed patient at the bedside as a follow up for palliative medicine needs and emotional support.  Patient is visiting with her old boss Dr. Ethyl, she expresses great appreciation for his visit. She reports continued intermittent  abdominal pain today, ongoing lack of appetite, generalized weakness.  Created space and opportunity for patient to explore thoughts and feelings regarding her current medical situation.  She clearly and  pragmatically shares and recognizes her advanced age.  She speaks to having a good life.  She verbalizes that quality of life is of utmost importance.  Currently she is open for improvement and ability to return to her previous living situation, however, if she cannot improve to the point of what she views quality of life to be; (some sense of independence, ability to eat and drink, ability to move about her apartment) she recognizes that the time will come to forego life-prolonging measures and allow a natural death focusing on comfort and dignity.  Education offered on the difference between a full medical support path attempting to prolong life versus a palliative comfort path allowing for natural death  Education offered on the natural trajectory at end-of-life  Education offered on hospice benefit; philosophy and eligibility  Plan of Care: -DNR/DNR -Continue current management, patient is hopeful for improvement, she is open to SNF for rehab if eligible       -Recommendation per IR not to  pursue additional angioplasty/stent procedure - Symptom management per attending - PMT will continue to support holistically, include son in GOC discussions    Education offered today regarding  the importance of physical and functional decline conversation with family and their  medical providers regarding overall plan of care and treatment options,  ensuring decisions are within the context of the patients values and GOCs.  Questions and concerns addressed   Discussed with primary team and nursing staff   Time: 65   minutes  Detailed review of medical records ( labs, imaging, vital signs), medically appropriate exam ( MS, skin, cardiac,  resp)   discussed with treatment team, counseling and education to patient, family, staff, documenting clinical information, medication management, coordination of care    Linda Plants NP  Palliative Medicine Team Team Phone # [phone] Pager [phone]

## 2024-03-30 NOTE — Progress Notes (Signed)
 In and out cath performed per order with Shiann,RN. emptied from bladder. Gherghe,MD made aware.

## 2024-03-30 NOTE — [place] FL2 (Signed)
 [place]  MEDICAID FL2 LEVEL OF CARE FORM     IDENTIFICATION  Patient Name: [name] Birthdate: 12/05/1931 Sex: female Admission Date (Current Location): 03/26/2024  [facility] and [place] Number:  [employer] and Address:  [facility] [place], [address] [place], [place] [address]      Provider Number: [id number]  Attending Physician Name and Address:  Trixie Nilda HERO, MD  Relative Name and Phone Number:       Current Level of Care: Hospital Recommended Level of Care: Skilled Nursing Facility Prior Approval Number:    Date Approved/Denied:   PASRR Number: [id number]  Discharge Plan: SNF    Current Diagnoses: Patient Active Problem List   Diagnosis Date Noted   Palliative care by specialist 03/29/2024   DNR (do not resuscitate) 03/29/2024   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   Skin rash 03/24/2024   Lesion of nose 03/24/2024   History of Clostridioides difficile colitis 02/18/2024   Overflow diarrhea 02/18/2024   Abnormal findings on diagnostic imaging of other specified body structures 02/18/2024   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/15/2024   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 01/15/2024   Recurrent UTI 01/15/2024   Type 2 diabetes mellitus with hyperglycemia (HCC) 12/28/2023   UTI (urinary tract infection) 12/27/2023   Frequency of urination 12/24/2023   Acute blood loss anemia 04/17/2023   Gastritis and gastroduodenitis 04/10/2023   Abdominal pain, chronic, epigastric 04/08/2023   Mesenteric artery stenosis 04/07/2023   Diabetic neuropathy (HCC) 11/30/2022   S/P total knee arthroplasty, right 04/12/2022   Aortic atherosclerosis 02/25/2022   Chronic non-seasonal allergic rhinitis 02/25/2022   GERD (gastroesophageal reflux disease) 02/17/2022   Myalgia due to statin 02/15/2020   Cognitive dysfunction 03/04/2019   Generalized anxiety disorder 06/05/2018   Hyponatremia  03/08/2018   Osteoarthritis of right knee 12/02/2017   Hyperlipidemia associated with type 2 diabetes mellitus (HCC) 10/02/2016   Insomnia 10/02/2016   Essential hypertension 12/15/2014   Type 2 diabetes mellitus with atherosclerosis of aorta (HCC) 02/15/2011    Orientation RESPIRATION BLADDER Height & Weight     Self, Time, Situation, Place  Normal Continent Weight: 174 lb 2.6 oz (79 kg) Height:  5' 8 (172.7 cm)  BEHAVIORAL SYMPTOMS/MOOD NEUROLOGICAL BOWEL NUTRITION STATUS      Continent Diet (See DC Summary)  AMBULATORY STATUS COMMUNICATION OF NEEDS Skin   Limited Assist Verbally Normal                       Personal Care Assistance Level of Assistance  Bathing, Feeding, Dressing Bathing Assistance: Limited assistance Feeding assistance: Limited assistance Dressing Assistance: Limited assistance     Functional Limitations Info  Sight, Hearing, Speech Sight Info: Impaired Hearing Info: Adequate Speech Info: Adequate    SPECIAL CARE FACTORS FREQUENCY                       Contractures Contractures Info: Not present    Additional Factors Info  Code Status, Allergies Code Status Info: DNR-Limited Allergies Info: Statins; Phenergan (Promethazine Hcl); Hydrocortisone; :isinopril; Prednisone ; Trazodone  & Nefazodone           Current Medications (03/30/2024):  This is the current hospital active medication list Current Facility-Administered Medications  Medication Dose Route Frequency Provider Last Rate Last Admin   acetaminophen  (TYLENOL ) tablet 650 mg  650 mg Oral Q6H PRN Elgergawy, Dawood S, MD   650 mg at 03/27/24 1417  Or   acetaminophen  (TYLENOL ) suppository 650 mg  650 mg Rectal Q6H PRN Elgergawy, Dawood S, MD       albuterol  (PROVENTIL ) (2.5 MG/3ML) 0.083% nebulizer solution 2.5 mg  2.5 mg Inhalation Q6H PRN Elgergawy, Dawood S, MD       ALPRAZolam  (XANAX ) tablet 0.25 mg  0.25 mg Oral QHS PRN Elgergawy, Dawood S, MD   0.25 mg at 03/29/24 2127    amLODipine  (NORVASC ) tablet 10 mg  10 mg Oral Daily Elgergawy, Dawood S, MD   10 mg at 03/29/24 0902   ampicillin-sulbactam (UNASYN) 1.5 g in sodium chloride  0.9 % 100 mL IVPB  1.5 g Intravenous Q6H Reome, Earle J, RPH 200 mL/hr at 03/30/24 0905 1.5 g at 03/30/24 0905   artificial tears ophthalmic solution 1 drop  1 drop Both Eyes BID Elgergawy, Brayton RAMAN, MD   1 drop at 03/30/24 9087   aspirin  chewable tablet 81 mg  81 mg Oral Daily Elgergawy, Dawood S, MD   81 mg at 03/29/24 9097   bisacodyl  (DULCOLAX) suppository 10 mg  10 mg Rectal Daily PRN Elgergawy, Dawood S, MD       clopidogrel  (PLAVIX ) tablet 75 mg  75 mg Oral Daily Elgergawy, Dawood S, MD   75 mg at 03/29/24 0901   cyanocobalamin  (VITAMIN B12) tablet 1,000 mcg  1,000 mcg Oral QHS Elgergawy, Dawood S, MD   1,000 mcg at 03/29/24 2118   cycloSPORINE  (RESTASIS ) 0.05 % ophthalmic emulsion 1 drop  1 drop Both Eyes BID Elgergawy, Brayton RAMAN, MD   1 drop at 03/30/24 0911   docusate sodium  (COLACE) capsule 100 mg  100 mg Oral BID Elgergawy, Dawood S, MD   100 mg at 03/29/24 2122   enoxaparin  (LOVENOX ) injection 40 mg  40 mg Subcutaneous Q24H Elgergawy, Dawood S, MD   40 mg at 03/29/24 2119   estradiol  (ESTRACE ) vaginal cream 1 Applicatorful  1 Applicatorful Vaginal QHS Elgergawy, Brayton RAMAN, MD   1 Applicatorful at 03/28/24 2053   feeding supplement (GLUCERNA SHAKE) (GLUCERNA SHAKE) liquid 237 mL  237 mL Oral TID BM Elgergawy, Dawood S, MD   237 mL at 03/29/24 2122   gabapentin  (NEURONTIN ) capsule 100 mg  100 mg Oral q AM Elgergawy, Dawood S, MD   100 mg at 03/30/24 9373   hydrALAZINE  (APRESOLINE ) injection 5 mg  5 mg Intravenous Q4H PRN Elgergawy, Dawood S, MD   5 mg at 03/30/24 9092   hydrALAZINE  (APRESOLINE ) tablet 25 mg  25 mg Oral TID Elgergawy, Dawood S, MD   25 mg at 03/29/24 2121   insulin  aspart (novoLOG ) injection 0-9 Units  0-9 Units Subcutaneous TID WC Elgergawy, Dawood S, MD   2 Units at 03/30/24 0902   melatonin tablet 9 mg  9 mg Oral QHS  Elgergawy, Dawood S, MD   9 mg at 03/29/24 2121   methenamine  (MANDELAMINE) tablet 1 g  1 g Oral BID WC Elgergawy, Dawood S, MD   1 g at 03/29/24 1736   morphine  (PF) 2 MG/ML injection 2 mg  2 mg Intravenous Q4H PRN Elgergawy, Dawood S, MD   2 mg at 03/29/24 1919   multivitamin (PROSIGHT) tablet 1 tablet  1 tablet Oral Daily Elgergawy, Dawood S, MD   1 tablet at 03/29/24 0901   ondansetron  (ZOFRAN ) tablet 4 mg  4 mg Oral Q6H PRN Elgergawy, Dawood S, MD       Or   ondansetron  (ZOFRAN ) injection 4 mg  4 mg Intravenous Q6H PRN Elgergawy,  Brayton RAMAN, MD       pantoprazole  (PROTONIX ) EC tablet 40 mg  40 mg Oral BID AC Elgergawy, Dawood S, MD   40 mg at 03/29/24 1736   polyethylene glycol (MIRALAX  / GLYCOLAX ) packet 17 g  17 g Oral Daily PRN Elgergawy, Dawood S, MD       tamsulosin  (FLOMAX ) capsule 0.4 mg  0.4 mg Oral Daily Elgergawy, Dawood S, MD   0.4 mg at 03/29/24 0901   vitamin A & D ointment   Topical PRN Gherghe, Costin M, MD   1 Application at 03/28/24 2051     Discharge Medications: Please see discharge summary for a list of discharge medications.  Relevant Imaging Results:  Relevant Lab Results:   Additional Information SSN: [id number]  Jeoffrey LITTIE Moose, LCSWA

## 2024-03-30 NOTE — Progress Notes (Signed)
 PT Cancellation Note  Patient Details Name: [name] MRN: [id number] DOB: 1931-11-19   Cancelled Treatment:    Reason Eval/Treat Not Completed: Pain limiting ability to participate. Pt very uncomfortable and not feeling up to mobility. Will check back later as time allows.   Richerd Lipoma, PT  Acute Rehab Services Secure chat preferred Office [phone]    Richerd LITTIE Lipoma 03/30/2024, 10:27 AM

## 2024-03-30 NOTE — Progress Notes (Signed)
 Mobility Specialist Progress Note:    03/30/24 1558  Mobility  Activity Ambulated with assistance (In hallway)  Level of Assistance Standby assist, set-up cues, supervision of patient - no hands on  Assistive Device Front wheel walker  Distance Ambulated (ft) 270 ft  Activity Response Tolerated well  Mobility Referral Yes  Mobility visit 1 Mobility  Mobility Specialist Start Time (ACUTE ONLY) 1537  Mobility Specialist Stop Time (ACUTE ONLY) 1553  Mobility Specialist Time Calculation (min) (ACUTE ONLY) 16 min   Received pt in bed and agreeable to mobility. No physical assistance needed. C/o pain, unspecified where. Returned to room without fault. Pt requested to use the BR before going to bed. Pt left in bed. Personal belongings and call light within reach. All needs met.  [name] Mobility Specialist  Please contact via [employer] or  Rehab Office [phone]

## 2024-03-30 NOTE — TOC Progression Note (Signed)
 Transition of Care (TOC) - Progression Note   Spoke to Tammy at [facility] [phone] . For HHPT, MD just needs to put in DC summary PT three times a week and they will arrange it through [place] , they will need FL2    Patient Details  Name: ELAYNAH VIRGINIA MRN: [id number] Date of Birth: 1931-10-10  Transition of Care [facility]) CM/SW Contact  Ephram Kornegay, Powell Jansky, RN Phone Number: 03/30/2024, 9:31 AM  Clinical Narrative:       Expected Discharge Plan: Home w Home Health Services Barriers to Discharge: Continued Medical Work up               Expected Discharge Plan and Services In-house Referral: Clinical Social Work Discharge Planning Services: CM Consult Post Acute Care Choice: Home Health Living arrangements for the past 2 months: Apartment, Assisted Living Facility                 DME Arranged: N/A         HH Arranged:  (see note)           Social Drivers of Health (SDOH) Interventions SDOH Screenings   Food Insecurity: No Food Insecurity (03/26/2024)  Housing: Low Risk  (03/26/2024)  Transportation Needs: No Transportation Needs (03/26/2024)  Utilities: Not At Risk (03/26/2024)  Alcohol  Screen: Low Risk  (09/06/2022)  Depression (PHQ2-9): Low Risk  (03/03/2024)  Financial Resource Strain: Low Risk  (09/06/2022)  Physical Activity: Sufficiently Active (09/06/2022)  Social Connections: Moderately Isolated (03/26/2024)  Stress: No Stress Concern Present (09/06/2022)  Tobacco Use: Low Risk  (03/26/2024)    Readmission Risk Interventions    12/28/2023    5:41 PM 09/15/2023    3:04 PM 04/08/2023    2:11 PM  Readmission Risk Prevention Plan  Transportation Screening Complete Complete Complete  PCP or Specialist Appt within 3-5 Days   Not Complete  HRI or Home Care Consult  Complete Complete  Social Work Consult for Recovery Care Planning/Counseling  Complete Complete  Palliative Care Screening  Not Applicable Not Applicable  Medication Review Furniture conservator/restorer) Complete Complete Complete  PCP or Specialist appointment within 3-5 days of discharge Complete    HRI or Home Care Consult Complete    SW Recovery Care/Counseling Consult Complete    Palliative Care Screening Not Applicable    Skilled Nursing Facility Not Applicable

## 2024-03-31 DIAGNOSIS — Z515 Encounter for palliative care: Secondary | ICD-10-CM | POA: Diagnosis not present

## 2024-03-31 DIAGNOSIS — Z66 Do not resuscitate: Secondary | ICD-10-CM | POA: Diagnosis not present

## 2024-03-31 DIAGNOSIS — K55059 Acute (reversible) ischemia of intestine, part and extent unspecified: Secondary | ICD-10-CM

## 2024-03-31 DIAGNOSIS — K551 Chronic vascular disorders of intestine: Secondary | ICD-10-CM | POA: Diagnosis not present

## 2024-03-31 LAB — COMPREHENSIVE METABOLIC PANEL WITH GFR
ALT: 12 U/L (ref 0–44)
AST: 16 U/L (ref 15–41)
Albumin: 3.2 g/dL — ABNORMAL LOW (ref 3.5–5.0)
Alkaline Phosphatase: 73 U/L (ref 38–126)
Anion gap: 12 (ref 5–15)
BUN: 20 mg/dL (ref 8–23)
CO2: 24 mmol/L (ref 22–32)
Calcium: 9.1 mg/dL (ref 8.9–10.3)
Chloride: 101 mmol/L (ref 98–111)
Creatinine, Ser: 0.96 mg/dL (ref 0.44–1.00)
GFR, Estimated: 56 mL/min — ABNORMAL LOW (ref >60)
Glucose, Bld: 135 mg/dL — ABNORMAL HIGH (ref 70–99)
Potassium: 4.3 mmol/L (ref 3.5–5.1)
Sodium: 137 mmol/L (ref 135–145)
Total Bilirubin: 0.6 mg/dL (ref 0.0–1.2)
Total Protein: 6.1 g/dL — ABNORMAL LOW (ref 6.5–8.1)

## 2024-03-31 LAB — CBC
HCT: 34 % — ABNORMAL LOW (ref 36.0–46.0)
Hemoglobin: 10.8 g/dL — ABNORMAL LOW (ref 12.0–15.0)
MCH: 27.4 pg (ref 26.0–34.0)
MCHC: 31.8 g/dL (ref 30.0–36.0)
MCV: 86.3 fL (ref 80.0–100.0)
Platelets: 273 K/uL (ref 150–400)
RBC: 3.94 MIL/uL (ref 3.87–5.11)
RDW: 14.6 % (ref 11.5–15.5)
WBC: 5.9 K/uL (ref 4.0–10.5)
nRBC: 0 % (ref 0.0–0.2)

## 2024-03-31 LAB — GLUCOSE, CAPILLARY
Glucose-Capillary: 188 mg/dL — ABNORMAL HIGH (ref 70–99)
Glucose-Capillary: 116 mg/dL — ABNORMAL HIGH (ref 70–99)
Glucose-Capillary: 129 mg/dL — ABNORMAL HIGH (ref 70–99)
Glucose-Capillary: 146 mg/dL — ABNORMAL HIGH (ref 70–99)

## 2024-03-31 LAB — MAGNESIUM: Magnesium: 2.1 mg/dL (ref 1.7–2.4)

## 2024-03-31 MED ORDER — AMLODIPINE BESYLATE 10 MG PO TABS
10.0000 mg | ORAL_TABLET | Freq: Every day | ORAL | Status: DC
  Administered 2024-03-31 – 2024-04-02 (×3): 10 mg via ORAL
  Filled 2024-03-31 (×4): qty 1

## 2024-03-31 NOTE — Hospital Course (Signed)
 89 y.o. female, with medical history significant of hypertension, hyperlipidemia, T2DM, GERD mesenteric artery stenosis and stent placement, history of urinary retention with recurrent UTIs, she lives at ALF, came into the hospital with abdominal pain, nausea, poor appetite. She has a history of mesenteric ischemia with prior SMA stent. He has a history of mesenteric underwent a CT angiogram which showed patent SMA stent, however celiac artery stenosis. She failed conservative management due to significant pain with eating, and was transferred from [facility] to [facility] for IR evaluation

## 2024-03-31 NOTE — Progress Notes (Signed)
 Mobility Specialist Progress Note:   03/31/24 1618  Mobility  Activity Ambulated with assistance (In hallway)  Level of Assistance Standby assist, set-up cues, supervision of patient - no hands on  Assistive Device Front wheel walker  Distance Ambulated (ft) 510 ft  Activity Response Tolerated well  Mobility Referral Yes  Mobility visit 1 Mobility  Mobility Specialist Start Time (ACUTE ONLY) 1609  Mobility Specialist Stop Time (ACUTE ONLY) 1619  Mobility Specialist Time Calculation (min) (ACUTE ONLY) 10 min   Received pt in chair and agreeable to mobility. Pt c/o abdominal pain, otherwise tolerated well. Pt was asymptomatic throughout ambulation and returned to room w/o fault. Left in chair w/ call bell in reach and all needs met.   Lavanda Pollack Mobility Specialist  Please contact via [employer] or  Rehab Office [phone]

## 2024-03-31 NOTE — Progress Notes (Addendum)
 Physical Therapy Treatment Patient Details Name: [name] MRN: [id number] DOB: 08-Mar-1932 Today's Date: 03/31/2024   History of Present Illness [name] is a 89 y.o. female admitted 03/26/24 for mesenteric angina. 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 PMHx: HTN, HLD, T2DM, GERD, mesenteric artery stenosis s/p SMA stent placement, and urinary retention with recurrent UTIs.    PT Comments  Pt reports abdominal discomfort from constipation; RN notified. Pt pleasant and agreeable to physical therapy session. Pt ambulating 380 ft with a RW without physical assist. Performed serial sit to stands for functional strengthening and LE power. Good activity tolerance throughout. HHPT remains appropriate.    If plan is discharge home, recommend the following: A little help with bathing/dressing/bathroom;Assistance with cooking/housework;Assist for transportation;Help with stairs or ramp for entrance   Can travel by private vehicle        Equipment Recommendations  None recommended by PT    Recommendations for Other Services       Precautions / Restrictions Precautions Precautions: Fall Recall of Precautions/Restrictions: Intact Restrictions Weight Bearing Restrictions Per Provider Order: No     Mobility  Bed Mobility Overal bed mobility: Independent                  Transfers Overall transfer level: Modified independent Equipment used: Rolling walker (2 wheels)                    Ambulation/Gait Ambulation/Gait assistance: Supervision Gait Distance (Feet): 380 Feet Assistive device: Rolling walker (2 wheels) Gait Pattern/deviations: Step-through pattern, Decreased stride length       General Gait Details: Rounded shoulders, good cadence, supervision for safety   Stairs             Wheelchair Mobility     Tilt Bed    Modified Rankin (Stroke Patients Only)       Balance Overall balance assessment: Mild  deficits observed, not formally tested                                          Communication Communication Communication: No apparent difficulties  Cognition Arousal: Alert Behavior During Therapy: WFL for tasks assessed/performed   PT - Cognitive impairments: No apparent impairments                         Following commands: Intact      Cueing Cueing Techniques: Verbal cues  Exercises Other Exercises Other Exercises: x10 sit to stands    General Comments        Pertinent Vitals/Pain Pain Assessment Pain Assessment: Faces Faces Pain Scale: Hurts little more Pain Location: Abdomen Pain Descriptors / Indicators: Discomfort, Sore Pain Intervention(s): Monitored during session    Home Living                          Prior Function            PT Goals (current goals can now be found in the care plan section) Acute Rehab PT Goals Patient Stated Goal: Return Home Potential to Achieve Goals: Good Progress towards PT goals: Progressing toward goals    Frequency    Min 1X/week      PT Plan      Co-evaluation  AM-PAC PT 6 Clicks Mobility   Outcome Measure  Help needed turning from your back to your side while in a flat bed without using bedrails?: None Help needed moving from lying on your back to sitting on the side of a flat bed without using bedrails?: None Help needed moving to and from a bed to a chair (including a wheelchair)?: None Help needed standing up from a chair using your arms (e.g., wheelchair or bedside chair)?: None Help needed to walk in hospital room?: A Little Help needed climbing 3-5 steps with a railing? : A Little 6 Click Score: 22    End of Session Equipment Utilized During Treatment: Gait belt Activity Tolerance: Patient tolerated treatment well Patient left: in chair;with call bell/phone within reach;with chair alarm set Nurse Communication: Mobility status;Other  (comment) (pt requesting something to have a bowel movement) PT Visit Diagnosis: Other abnormalities of gait and mobility (R26.89)     Time: 9067-9051 PT Time Calculation (min) (ACUTE ONLY): 16 min  Charges:    $Therapeutic Activity: 8-22 mins PT General Charges $$ ACUTE PT VISIT: 1 Visit                     Aleck Daring, PT, DPT Acute Rehabilitation Services Office [phone]    Aleck ONEIDA Daring 03/31/2024, 9:54 AM

## 2024-03-31 NOTE — Plan of Care (Signed)
  Problem: Education: Goal: Ability to describe self-care measures that may prevent or decrease complications (Diabetes Survival Skills Education) will improve Outcome: Progressing   Problem: Coping: Goal: Ability to adjust to condition or change in health will improve Outcome: Progressing   Problem: Fluid Volume: Goal: Ability to maintain a balanced intake and output will improve Outcome: Progressing   Problem: Activity: Goal: Risk for activity intolerance will decrease Outcome: Progressing   Problem: Coping: Goal: Level of anxiety will decrease Outcome: Progressing

## 2024-03-31 NOTE — Progress Notes (Signed)
 Daily Progress Note   Date: 03/31/2024   Patient Name: [name]  DOB: 09-06-1931  MRN: [id number]  Age / Sex: 89 y.o., female  Attending Physician: Jonel Lonni SQUIBB, * Primary Care Physician: [name], MD Admit Date: 03/26/2024 Length of Stay: 5 days  Reason for Follow-up: Establishing goals of care  Past Medical History:  Diagnosis Date   Blood transfusion without reported diagnosis    Cognitive dysfunction 03/04/2019   Patient scores 21 out of 30 on [place] cognitive assessment September 2020   Diabetes mellitus without complication (HCC)    Diabetic peripheral neuropathy associated with type 2 diabetes mellitus (HCC) 02/25/2022   Diverticulitis    Frailty 03/04/2019   H/O bilateral breast reduction surgery    Hyperlipidemia    a. intolerant to statins.    Hypertension     Assessment & Plan:   HPI/Patient Profile:  89 y.o. 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 Palliative consulted after patient expressed interest in hospice and desire to no longer pursue invasive interventions.   - Morphine  2 mg x3 in 24 hours for abdominal pain - Patient reports feeling better today compared to yesterday and was able to walk around the unit with PT when she was not able to  yesterday - Continues to express that she is tired of returning to the hospital - Open to minor procedures and interventions if it will make her feel better in the context of her mesenteric ischemia and frequent UTIs  SUMMARY OF RECOMMENDATIONS DNR - limited Continue current management, SNF for short term rehab and outpatient palliative following for future deterioration  Code Status: DNR - Limited (DNR/DNI)  Prognosis: Unable to determine  Discharge Planning: Skilled Nursing Facility for rehab with Palliative care service follow-up  Discussed  with: Patient, Danford MD, Estelle PEAK, Cherisse NP  Subjective:   Subjective: Chart Reviewed. Updates received. Patient Assessed. Created space and opportunity for patient  and family to explore thoughts and feelings regarding current medical situation.  Today's Discussion: Today before meeting with the patient/family, I reviewed the chart notes including IR and general surgery notes. I also reviewed vital signs, nursing flowsheets, medication administrations record, labs, and imaging.  - Patient overall feeling more comfortable than yesterday after she had urinary retention that required I/O cath with 500 mL output - Reports that she has eaten more solid food in the last two days without abdominal discomfort, but it has been alleviated by the morphine  - Requesting assistance with her bowel movements reporting that she has not had one in 4 days, she is open to having a suppository - Shared with us  that her son wants her to give the medical team more time to get a hold of things before making a decision on de-escalation of care  Review of Systems  Constitutional:  Positive for appetite change and fatigue.  Genitourinary:  Positive for difficulty urinating.   Objective:   Primary Diagnoses: Present on Admission:  Mesenteric angina  Type 2 diabetes mellitus with hyperglycemia (HCC)  Type 2 diabetes mellitus with atherosclerosis of aorta (HCC)  Hyperlipidemia associated with type 2 diabetes mellitus (HCC)  Abdominal pain, chronic, epigastric   Vital Signs:  BP (!) 142/54   Pulse 77   Temp 98 F (36.7 C) (Oral)   Resp 18   Ht 5' 8 (1.727 m)   Wt 79 kg  SpO2 95%   BMI 26.48 kg/m   Physical Exam HENT:     Head: Normocephalic.  Eyes:     Extraocular Movements: Extraocular movements intact.  Neurological:     General: No focal deficit present.     Mental Status: She is alert.  Psychiatric:        Mood and Affect: Mood is anxious and depressed.    Palliative  Assessment/Data: 70%   Existing Vynca/ACP Documentation: Advanced directives on file  Thank you for allowing us  to participate in the care of ARIYANA FAW PMT will continue to support holistically.  Time Total: 50 minutes  Detailed review of medical records (labs, imaging, vital signs), medically appropriate exam, discussed with treatment team, counseling and education to patient, family, & staff, documenting clinical information, medication management, coordination of care  Fairy FORBES Shan DEVONNA  Palliative Medicine Team  Team Phone # [phone] (Nights/Weekends) 03/31/2024 9:22 AM

## 2024-03-31 NOTE — Progress Notes (Signed)
  Progress Note   Patient: [name] FMW:[id number] DOB: Jul 29, 1931 DOA: 03/26/2024     5 DOS: the patient was seen and examined on 03/31/2024 at 10:20AM      Brief hospital course: 89 y.o. female, with medical history significant of hypertension, hyperlipidemia, T2DM, GERD mesenteric artery stenosis and stent placement, history of urinary retention with recurrent UTIs, she lives at ALF, came into the hospital with abdominal pain, nausea, poor appetite. She has a history of mesenteric ischemia with prior SMA stent. He has a history of mesenteric underwent a CT angiogram which showed patent SMA stent, however celiac artery stenosis. She failed conservative management due to significant pain with eating, and was transferred from [facility] to [facility] for IR evaluation   Here she was evaluated by IR who felt celiac stenting was unlikely to provide meaningful clinical benefit.   Assessment and Plan: Abdominal pain and nausea Discussed with IR and vascular surgery.  In the setting of 10 pounds weight gain over the last 10 months, patent SMA stent, with good runoff, unlikely that her stable celiac stenosis is causing significant pain. - Advance diet as tolerated   UTI History of recurrent UTI -Continue Unasyn - Continue topical estrogen - Resume methenamine  at discharge - Follow-up with urology  Hypertension Peripheral vascular disease Blood pressure slightly elevated - Continue amlodipine , hydralazine , irbesartan  - Continue Plavix , aspirin   Urinary retention Transient, no recurrence today - Continue Flomax   Diabetes Glucose controlled - Continue sliding scale corrections  Right groin pseudoaneurysm Asymptomatic  History of C. difficile  Anxiety -Xanax  as needed  Normocytic anemia No clinical bleeding        Subjective: Patient able to eat a pancake this morning, some ice cream and an Ensure last night.  No vomiting.  No significant pain.  No fever,  confusion.     Physical Exam: BP (!) 154/62 (BP Location: Right Arm)   Pulse 79   Temp (!) 97.5 F (36.4 C) (Oral)   Resp 18   Ht 5' 8 (1.727 m)   Wt 79 kg   SpO2 98%   BMI 26.48 kg/m   General: Pt is alert, awake, not in acute distress Cardiovascular: RRR, nl S1-S2, no murmurs appreciated.   No LE edema.   Respiratory: Normal respiratory rate and rhythm.  CTAB without rales or wheezes. Abdominal: Abdomen soft and non-tender.  No distension or HSM.   Neuro/Psych: Strength symmetric in upper and lower extremities.  Judgment and insight appear normal.   Data Reviewed: Basic metabolic panel shows normal electrolytes and renal function CBC shows mild anemia      Family Communication: Son by phone    Disposition: Status is: Inpatient         Author: Lonni SHAUNNA Dalton, MD 03/31/2024 5:40 PM  For on call review [email or web address].

## 2024-03-31 NOTE — Progress Notes (Signed)
 IV team order placed for newIV

## 2024-04-01 ENCOUNTER — Institutional Professional Consult (permissible substitution) (INDEPENDENT_AMBULATORY_CARE_PROVIDER_SITE_OTHER): Admitting: Otolaryngology

## 2024-04-01 DIAGNOSIS — N390 Urinary tract infection, site not specified: Secondary | ICD-10-CM | POA: Diagnosis not present

## 2024-04-01 DIAGNOSIS — Z515 Encounter for palliative care: Secondary | ICD-10-CM | POA: Diagnosis not present

## 2024-04-01 DIAGNOSIS — Z7189 Other specified counseling: Secondary | ICD-10-CM

## 2024-04-01 DIAGNOSIS — K551 Chronic vascular disorders of intestine: Secondary | ICD-10-CM | POA: Diagnosis not present

## 2024-04-01 LAB — COMPREHENSIVE METABOLIC PANEL WITH GFR
ALT: 13 U/L (ref 0–44)
AST: 15 U/L (ref 15–41)
Albumin: 3.2 g/dL — ABNORMAL LOW (ref 3.5–5.0)
Alkaline Phosphatase: 64 U/L (ref 38–126)
Anion gap: 9 (ref 5–15)
BUN: 18 mg/dL (ref 8–23)
CO2: 25 mmol/L (ref 22–32)
Calcium: 8.9 mg/dL (ref 8.9–10.3)
Chloride: 101 mmol/L (ref 98–111)
Creatinine, Ser: 0.84 mg/dL (ref 0.44–1.00)
GFR, Estimated: >60 mL/min (ref >60)
Glucose, Bld: 114 mg/dL — ABNORMAL HIGH (ref 70–99)
Potassium: 4.2 mmol/L (ref 3.5–5.1)
Sodium: 135 mmol/L (ref 135–145)
Total Bilirubin: 0.9 mg/dL (ref 0.0–1.2)
Total Protein: 5.9 g/dL — ABNORMAL LOW (ref 6.5–8.1)

## 2024-04-01 LAB — CBC
HCT: 32.5 % — ABNORMAL LOW (ref 36.0–46.0)
Hemoglobin: 10.3 g/dL — ABNORMAL LOW (ref 12.0–15.0)
MCH: 27.3 pg (ref 26.0–34.0)
MCHC: 31.7 g/dL (ref 30.0–36.0)
MCV: 86.2 fL (ref 80.0–100.0)
Platelets: 251 K/uL (ref 150–400)
RBC: 3.77 MIL/uL — ABNORMAL LOW (ref 3.87–5.11)
RDW: 14.6 % (ref 11.5–15.5)
WBC: 6.5 K/uL (ref 4.0–10.5)
nRBC: 0 % (ref 0.0–0.2)

## 2024-04-01 LAB — GLUCOSE, CAPILLARY
Glucose-Capillary: 225 mg/dL — ABNORMAL HIGH (ref 70–99)
Glucose-Capillary: 106 mg/dL — ABNORMAL HIGH (ref 70–99)
Glucose-Capillary: 159 mg/dL — ABNORMAL HIGH (ref 70–99)
Glucose-Capillary: 112 mg/dL — ABNORMAL HIGH (ref 70–99)

## 2024-04-01 MED ORDER — OXYCODONE-ACETAMINOPHEN 5-325 MG PO TABS
1.0000 | ORAL_TABLET | ORAL | Status: DC | PRN
  Administered 2024-04-01 – 2024-04-02 (×4): 1 via ORAL
  Filled 2024-04-01 (×4): qty 1

## 2024-04-01 MED ORDER — ZINC OXIDE 40 % EX OINT
TOPICAL_OINTMENT | Freq: Four times a day (QID) | CUTANEOUS | Status: DC | PRN
  Filled 2024-04-01: qty 57

## 2024-04-01 NOTE — Progress Notes (Signed)
 Mobility Specialist Progress Note:    04/01/24 1032  Mobility  Activity Ambulated with assistance (In hallway)  Level of Assistance Standby assist, set-up cues, supervision of patient - no hands on  Assistive Device Front wheel walker  Distance Ambulated (ft) 510 ft (x2)  Activity Response Tolerated well  Mobility Referral Yes  Mobility visit 1 Mobility  Mobility Specialist Start Time (ACUTE ONLY) 1016  Mobility Specialist Stop Time (ACUTE ONLY) 1029  Mobility Specialist Time Calculation (min) (ACUTE ONLY) 13 min   Received pt in bed and eager for mobility. No physical assistance needed. No c/o. Returned to room without fault. Left pt in chair. Personal belongings and call light within reach. All needs met.  Lavanda Pollack Mobility Specialist  Please contact via [employer] or  Rehab Office [phone]

## 2024-04-01 NOTE — Progress Notes (Signed)
  Progress Note   Patient: [name] FMW:[id number] DOB: 12-Jun-1932 DOA: 03/26/2024     6 DOS: the patient was seen and examined on 04/01/2024 at 9:09AM      Brief hospital course: 89 y.o. F with HTN, HLD, DM, recurrent UTI, and hx SMA stent in 04/2023 for chronic mesenteric ischemia who presented with waxing and waning abdominal burning pain, nausea and anorexia.    In the ER, CTA abdomen showed no acute findings.  SMA stent patent but celiac artery with known stenosis.  The assumption was made that her abdominal burning was from celiac stenosis, so she was sent to [facility] for IR evaluation.     Assessment and Plan: Abdominal burning, nausea Patient was evaluated by IR, the case was discussed with vascular surgery.  She has had 10 pounds of weight gain over the last 10 months, and a patent SMA stent with good runoff.  There is very low confidence that her stable celiac stenosis is the cause of her symptoms.  Oral intake in the last 24 hours has been okay, no food provoked symptoms.  She has waxing and waning abdominal pain, which gets better with IV morphine . -Continue to advance diet as tolerated -Transition to oral analgesics   UTI History of recurrent UTI I do not believe this is the cause of her pain, as her symptoms are not improved with antibiotic therapy.  She has a longstanding history of recurrent UTI, and is already on daily methenamine  and topical estrogen. - Follow-up with urology - Continue antibiotics, day 4 of 5  Urinary retention This was transient.  Likely due to constipation vs UTI vs both.  Hypertension Peripheral vascular disease Blood pressure slightly elevated.  Not on statin at baseline. - Continue amlodipine , low-dose aspirin , Plavix  - Continue hydralazine , irbesartan   Diabetes Glucose mostly controlled - Continue sliding scale corrections - Hold home metformin   Right groin pseudoaneurysm Asymptomatic, no treatment recommended.  History of C.  difficile Monitor stool burden  Anxiety -Continue home Xanax   Normocytic anemia Hemoglobin stable, no clinical bleeding       Subjective: Patient was able to tolerate some food yesterday.  She is having waxing and waning burning abdominal pain, throughout her abdomen.  This slowly worsens over the course of an hour, until it severe and she gets some IV morphine .  Then it resolves.  It is not provoked with food, movement, or anything else that she can identify.     Physical Exam: BP (!) 144/47   Pulse 73   Temp 98.1 F (36.7 C) (Oral)   Resp 18   Ht 5' 8 (1.727 m)   Wt 79 kg   SpO2 99%   BMI 26.48 kg/m   Thin adult female, sitting up in recliner, interactive and appropriate RRR, no murmurs, no peripheral edema Respiratory normal, lungs clear without rales or wheezes Abdomen soft no tenderness to palpation or guarding, no ascites or distention Attention normal, affect normal, judgment and insight appear normal, face symmetric, speech fluent    Data Reviewed: Basic metabolic panel shows normal electrolytes and renal function CBC shows no leukocytosis, only mild anemia    Family Communication:     Disposition: Status is: Inpatient         Author: Lonni SHAUNNA Dalton, MD 04/01/2024 2:36 PM  For on call review [email or web address].

## 2024-04-01 NOTE — Progress Notes (Signed)
 Upon entering patient's room during bedside reporting, patient stated I cut my finger on accident with the knife. Upon assessment, bleeding is controlled, patient denies any pain, finger cleansed with NS and band-aid was applied. MD made aware.

## 2024-04-01 NOTE — Plan of Care (Signed)

## 2024-04-01 NOTE — Plan of Care (Signed)
  Problem: Coping: Goal: Ability to adjust to condition or change in health will improve Outcome: Progressing   Problem: Health Behavior/Discharge Planning: Goal: Ability to identify and utilize available resources and services will improve Outcome: Progressing Goal: Ability to manage health-related needs will improve Outcome: Progressing   

## 2024-04-01 NOTE — Progress Notes (Signed)
 Daily Progress Note   Date: 04/01/2024   Patient Name: [name]  DOB: 1932-04-22  MRN: [id number]  Age / Sex: 89 y.o., female  Attending Physician: Jonel Lonni SQUIBB, * Primary Care Physician: [name], MD Admit Date: 03/26/2024 Length of Stay: 6 days  Reason for Follow-up: Establishing goals of care  Past Medical History:  Diagnosis Date   Blood transfusion without reported diagnosis    Cognitive dysfunction 03/04/2019   Patient scores 21 out of 30 on [place] cognitive assessment September 2020   Diabetes mellitus without complication (HCC)    Diabetic peripheral neuropathy associated with type 2 diabetes mellitus (HCC) 02/25/2022   Diverticulitis    Frailty 03/04/2019   H/O bilateral breast reduction surgery    Hyperlipidemia    a. intolerant to statins.    Hypertension     Assessment & Plan:   HPI/Patient Profile:  89 y.o. female with past medical history of HTN, HLD, GERD, mesenteric artery stenosis s/p stent placement (04/2023), recurrent UTIs admitted on 03/26/2024 with abdominal pain and IR evaluation. Palliative consulted after patient expressed interest in hospice and desire to no longer pursue invasive interventions.   - Morphine  2 mg x 3 in 24 hours for abdominal pain not associated with meals that continues even after having a bowel movement - Continues to ambulate well with rolling walker  SUMMARY OF RECOMMENDATIONS DNR-limited Continue current management, plan for discharge back to ALF with outpatient palliative following  Code Status: DNR - Limited (DNR/DNI)  Prognosis: Unable to determine  Discharge Planning: Back to ALF with palliative following outpatient  Discussed with: Patient, Danford MD, [name] (son), Stephane (daughter in Social worker)  Subjective:   Subjective: Chart Reviewed. Updates received. Patient Assessed. Created space and opportunity for patient  and family to explore thoughts and feelings regarding current medical  situation.  Today's Discussion: Today before meeting with the patient/family, I reviewed the chart notes including radiology. I also reviewed vital signs, nursing flowsheets, medication administrations record, labs, and imaging.  - Spoke with son and discussed possible placement and patient's desire for outpatient palliative to follow along and possibility of transitioning to hospice if the patient were to decline in the future, son is unsure of that pathway and would like further discussions - Son would like more work up for the patient's frequent UTIs and urology follow up as outpatient for methods to prevent reoccurrence, did express with the son that the patient has 2 medications specifically to address that issue, but it would be reasonable to pursue outpatient management - Daughter in law also inquired about possibility of seeking medical interventions and not transitioning to hospice if patient were to decline again and they were educated that it is possible to always seek treatment at hospitals for interventions even if hospice is active   Review of Systems  Constitutional:  Positive for appetite change.  Genitourinary:  Positive for difficulty urinating.    Objective:   Primary Diagnoses: Present on Admission:  Mesenteric angina  Type 2 diabetes mellitus with hyperglycemia (HCC)  Type 2 diabetes mellitus with atherosclerosis of aorta (HCC)  Hyperlipidemia associated with type 2 diabetes mellitus (HCC)  Abdominal pain, chronic, epigastric   Vital Signs:  BP (!) 144/47   Pulse 73   Temp 98.1 F (36.7 C) (Oral)   Resp 18   Ht 5' 8 (1.727 m)   Wt 79 kg   SpO2 99%   BMI 26.48 kg/m   Physical Exam HENT:     Head:  Normocephalic.  Eyes:     Extraocular Movements: Extraocular movements intact.  Pulmonary:     Effort: Pulmonary effort is normal.  Neurological:     General: No focal deficit present.     Mental Status: She is alert and oriented to person, place, and time.   Psychiatric:        Mood and Affect: Mood is depressed.    Palliative Assessment/Data: 70%   Existing Vynca/ACP Documentation: Advanced directives  Thank you for allowing us  to participate in the care of RHIAN ASEBEDO PMT will continue to support holistically.  Time Total: 50 minutes  Detailed review of medical records (labs, imaging, vital signs), medically appropriate exam, discussed with treatment team, counseling and education to patient, family, & staff, documenting clinical information, medication management, coordination of care  Fairy FORBES Shan DEVONNA  Palliative Medicine Team  Team Phone # [phone] (Nights/Weekends) 04/01/2024 10:12 AM

## 2024-04-01 NOTE — Progress Notes (Signed)
 Rounded on patient to speak with her about previously offered thrombin injection for R CFA pseudoaneurysm. She is very pleasant, alert, NAD.   Today, she is up and moving, returning to bed with her walker from restroom without assistance when I entered. Her pain is controlled well enough that she is able to more meaningfully participate in conversation about her care.  After meeting with palliative care, she tells me that she would not like to pursue any procedures that are not expected to directly improve abd sx. IR eval order will be removed as no other intervention is being offered by IR at this time (see previous IR notes).   Deshawna Mcneece NP 04/01/2024 10:02 AM

## 2024-04-02 ENCOUNTER — Telehealth: Payer: Self-pay | Admitting: Urology

## 2024-04-02 DIAGNOSIS — K551 Chronic vascular disorders of intestine: Secondary | ICD-10-CM | POA: Diagnosis not present

## 2024-04-02 LAB — GLUCOSE, CAPILLARY
Glucose-Capillary: 164 mg/dL — ABNORMAL HIGH (ref 70–99)
Glucose-Capillary: 182 mg/dL — ABNORMAL HIGH (ref 70–99)
Glucose-Capillary: 125 mg/dL — ABNORMAL HIGH (ref 70–99)

## 2024-04-02 MED ORDER — OXYCODONE-ACETAMINOPHEN 5-325 MG PO TABS: 1.0000 | ORAL_TABLET | ORAL | 0 refills | Status: DC | PRN

## 2024-04-02 MED ORDER — ALPRAZOLAM 0.25 MG PO TABS: 0.2500 mg | ORAL_TABLET | Freq: Every day | ORAL | 0 refills | Status: DC

## 2024-04-02 MED ORDER — SENNOSIDES-DOCUSATE SODIUM 8.6-50 MG PO TABS: 1.0000 | ORAL_TABLET | Freq: Every day | ORAL | Status: DC

## 2024-04-02 NOTE — Telephone Encounter (Signed)
 Return call to Kyra making her aware pt has the first available appointment with MD. Kyra verbalized understanding

## 2024-04-02 NOTE — NC FL2 (Signed)
 [place]  MEDICAID FL2 LEVEL OF CARE FORM     IDENTIFICATION  Patient Name: [name] Birthdate: 1931/10/06 Sex: female Admission Date (Current Location): 03/26/2024  [facility] [place] Number:  [employer] and Address:  [facility] [place], [address], [place], [place] [address]      Provider Number: [id number]  Attending Physician Name and Address:  Jonel Lonni SQUIBB, *  Relative Name and Phone Number:       Current Level of Care: Hospital Recommended Level of Care: Assisted Living Facility Prior Approval Number:    Date Approved/Denied:   PASRR Number: [id number]  Discharge Plan: Other (Comment) (ALF)    Current Diagnoses: Patient Active Problem List   Diagnosis Date Noted   DNR (do not resuscitate) discussion 04/01/2024   Occlusive mesenteric ischemia 03/31/2024   Palliative care by specialist 03/29/2024   DNR (do not resuscitate) 03/29/2024   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   Skin rash 03/24/2024   Lesion of nose 03/24/2024   History of Clostridioides difficile colitis 02/18/2024   Overflow diarrhea 02/18/2024   Abnormal findings on diagnostic imaging of other specified body structures 02/18/2024   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/15/2024   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 01/15/2024   Recurrent UTI 01/15/2024   Type 2 diabetes mellitus with hyperglycemia (HCC) 12/28/2023   UTI (urinary tract infection) 12/27/2023   Frequency of urination 12/24/2023   Acute blood loss anemia 04/17/2023   Gastritis and gastroduodenitis 04/10/2023   Abdominal pain, chronic, epigastric 04/08/2023   Mesenteric artery stenosis 04/07/2023   Diabetic neuropathy (HCC) 11/30/2022   S/P total knee arthroplasty, right 04/12/2022   Aortic atherosclerosis 02/25/2022   Chronic non-seasonal allergic rhinitis 02/25/2022   GERD (gastroesophageal reflux disease) 02/17/2022   Myalgia due to  statin 02/15/2020   Cognitive dysfunction 03/04/2019   Generalized anxiety disorder 06/05/2018   Hyponatremia 03/08/2018   Osteoarthritis of right knee 12/02/2017   Hyperlipidemia associated with type 2 diabetes mellitus (HCC) 10/02/2016   Insomnia 10/02/2016   Essential hypertension 12/15/2014   Type 2 diabetes mellitus with atherosclerosis of aorta (HCC) 02/15/2011    Orientation RESPIRATION BLADDER Height & Weight     Self, Time, Situation, Place  Normal Continent Weight: 174 lb 2.6 oz (79 kg) Height:  5' 8 (172.7 cm)  BEHAVIORAL SYMPTOMS/MOOD NEUROLOGICAL BOWEL NUTRITION STATUS      Continent Diet (See DC Summary)  AMBULATORY STATUS COMMUNICATION OF NEEDS Skin   Limited Assist Verbally Normal                       Personal Care Assistance Level of Assistance  Bathing, Feeding, Dressing Bathing Assistance: Limited assistance Feeding assistance: Limited assistance Dressing Assistance: Limited assistance     Functional Limitations Info  Sight, Hearing, Speech Sight Info: Impaired Hearing Info: Adequate Speech Info: Adequate    SPECIAL CARE FACTORS FREQUENCY                       Contractures Contractures Info: Not present    Additional Factors Info  Code Status, Allergies Code Status Info: DNR-Limited Allergies Info: Statins; Phenergan (Promethazine Hcl); Hydrocortisone; :isinopril; Prednisone ; Trazodone  & Nefazodone           Current Medications (04/02/2024):  This is the current hospital active medication list Current Facility-Administered Medications  Medication Dose Route Frequency Provider Last Rate Last Admin   acetaminophen  (TYLENOL ) tablet 650 mg  650 mg Oral Q6H PRN Elgergawy, Dawood S, MD   650 mg at 03/27/24 1417   Or   acetaminophen  (TYLENOL ) suppository 650 mg  650 mg Rectal Q6H PRN Elgergawy, Dawood S, MD       albuterol  (PROVENTIL ) (2.5 MG/3ML) 0.083% nebulizer solution 2.5 mg  2.5 mg Inhalation Q6H PRN Elgergawy, Dawood S, MD        ALPRAZolam  (XANAX ) tablet 0.25 mg  0.25 mg Oral QHS PRN Elgergawy, Dawood S, MD   0.25 mg at 04/01/24 2149   amLODipine  (NORVASC ) tablet 10 mg  10 mg Oral Daily Shona Laurence N, DO   10 mg at 04/02/24 9161   artificial tears ophthalmic solution 1 drop  1 drop Both Eyes BID Elgergawy, Brayton RAMAN, MD   1 drop at 04/02/24 0815   aspirin  chewable tablet 81 mg  81 mg Oral Daily Elgergawy, Dawood S, MD   81 mg at 04/02/24 9161   bisacodyl  (DULCOLAX) suppository 10 mg  10 mg Rectal Daily PRN Elgergawy, Dawood S, MD   10 mg at 03/31/24 1307   clopidogrel  (PLAVIX ) tablet 75 mg  75 mg Oral Daily Elgergawy, Dawood S, MD   75 mg at 04/02/24 9162   cyanocobalamin  (VITAMIN B12) tablet 1,000 mcg  1,000 mcg Oral QHS Elgergawy, Dawood S, MD   1,000 mcg at 04/01/24 2146   cycloSPORINE  (RESTASIS ) 0.05 % ophthalmic emulsion 1 drop  1 drop Both Eyes BID Elgergawy, Dawood S, MD   1 drop at 04/02/24 9161   docusate sodium  (COLACE) capsule 100 mg  100 mg Oral BID Elgergawy, Dawood S, MD   100 mg at 04/02/24 9161   enoxaparin  (LOVENOX ) injection 40 mg  40 mg Subcutaneous Q24H Elgergawy, Dawood S, MD   40 mg at 04/01/24 2144   estradiol  (ESTRACE ) vaginal cream 1 Applicatorful  1 Applicatorful Vaginal QHS Elgergawy, Brayton RAMAN, MD   1 Applicatorful at 03/30/24 2200   feeding supplement (GLUCERNA SHAKE) (GLUCERNA SHAKE) liquid 237 mL  237 mL Oral TID BM Elgergawy, Dawood S, MD   237 mL at 04/02/24 0857   gabapentin  (NEURONTIN ) capsule 100 mg  100 mg Oral q AM Elgergawy, Dawood S, MD   100 mg at 04/02/24 9361   hydrALAZINE  (APRESOLINE ) injection 5 mg  5 mg Intravenous Q4H PRN Elgergawy, Dawood S, MD   5 mg at 03/30/24 9092   hydrALAZINE  (APRESOLINE ) tablet 25 mg  25 mg Oral TID Elgergawy, Dawood S, MD   25 mg at 04/02/24 9161   insulin  aspart (novoLOG ) injection 0-9 Units  0-9 Units Subcutaneous TID WC Elgergawy, Dawood S, MD   2 Units at 04/02/24 1237   irbesartan  (AVAPRO ) tablet 150 mg  150 mg Oral Daily Gherghe, Costin M, MD   150  mg at 04/02/24 9161   liver oil-zinc  oxide (DESITIN) 40 % ointment   Topical QID PRN Danford, Lonni SQUIBB, MD       melatonin tablet 9 mg  9 mg Oral QHS Elgergawy, Dawood S, MD   9 mg at 04/01/24 2144   methenamine  (MANDELAMINE) tablet 1 g  1 g Oral BID WC Elgergawy, Dawood S, MD   1 g at 04/02/24 0839   multivitamin (PROSIGHT) tablet 1 tablet  1 tablet Oral Daily Elgergawy, Dawood S, MD   1 tablet at 04/02/24 9161   ondansetron  (ZOFRAN ) tablet 4 mg  4 mg Oral Q6H PRN Elgergawy, Dawood S, MD       Or   ondansetron  (ZOFRAN ) injection 4 mg  4 mg Intravenous Q6H PRN Elgergawy, Dawood S, MD       oxyCODONE -acetaminophen  (PERCOCET/ROXICET) 5-325 MG per tablet 1 tablet  1 tablet Oral Q4H PRN Jonel Lonni SQUIBB, MD   1 tablet at 04/02/24 9361   pantoprazole  (PROTONIX ) EC tablet 40 mg  40 mg Oral BID AC Elgergawy, Dawood S, MD   40 mg at 04/02/24 9161   polyethylene glycol (MIRALAX  / GLYCOLAX ) packet 17 g  17 g Oral Daily PRN Elgergawy, Dawood S, MD       tamsulosin  (FLOMAX ) capsule 0.4 mg  0.4 mg Oral Daily Elgergawy, Dawood S, MD   0.4 mg at 04/02/24 9162   vitamin A & D ointment   Topical PRN Gherghe, Costin M, MD   1 Application at 03/28/24 2051     Discharge Medications: Please see discharge summary for a list of discharge medications.  Relevant Imaging Results:  Relevant Lab Results:   Additional Information SSN: [id number]  Jeoffrey LITTIE Moose, LCSWA

## 2024-04-02 NOTE — Plan of Care (Signed)

## 2024-04-02 NOTE — TOC Transition Note (Signed)
 Transition of Care [facility]) - Discharge Note   Patient Details  Name: [name] MRN: [id number] Date of Birth: 04-Nov-1931  [facility] CM/SW Contact:  [name] Phone Number: 04/02/2024, 1:58 PM   Clinical Narrative:    Patient will DC to: [facility]  Anticipated DC date: 04/02/24 Family notified: Yes Transport by: Son will transport to facility/ private vehicle   Per MD patient ready for DC to [facility]. RN to call report prior to discharge [phone]. RN, patient, patient's family, and facility notified of DC. Discharge Summary and FL2 sent to facility. DC packet on chart. Pt son, [name], will pick up pt and transport her to facility.  CSW will sign off for now as social work intervention is no longer needed. Please consult us  again if new needs arise.     Final next level of care: Home w Home Health Services Barriers to Discharge: Barriers Resolved   Patient Goals and CMS Choice Patient states their goals for this hospitalization and ongoing recovery are:: to return to home CMS Medicare.gov Compare Post Acute Care list provided to:: Patient Choice offered to / list presented to : Patient      Discharge Placement              Patient chooses bed at: Other - please specify in the comment section below: [facility]) Patient to be transferred to facility by: Private vehicle (Son will drive her to facility) Name of family member notified: [name] Patient and family notified of of transfer: 04/02/24  Discharge Plan and Services Additional resources added to the After Visit Summary for   In-house Referral: Clinical Social Work Discharge Planning Services: CM Consult Post Acute Care Choice: Home Health          DME Arranged: N/A         HH Arranged:  (see note)          Social Drivers of Health (SDOH) Interventions SDOH Screenings   Food Insecurity: No Food Insecurity (03/26/2024)  Housing: Low Risk  (03/26/2024)   Transportation Needs: No Transportation Needs (03/26/2024)  Utilities: Not At Risk (03/26/2024)  Alcohol  Screen: Low Risk  (09/06/2022)  Depression (PHQ2-9): Low Risk  (03/03/2024)  Financial Resource Strain: Low Risk  (09/06/2022)  Physical Activity: Sufficiently Active (09/06/2022)  Social Connections: Moderately Isolated (03/26/2024)  Stress: No Stress Concern Present (09/06/2022)  Tobacco Use: Low Risk  (03/26/2024)     Readmission Risk Interventions    12/28/2023    5:41 PM 09/15/2023    3:04 PM 04/08/2023    2:11 PM  Readmission Risk Prevention Plan  Transportation Screening Complete Complete Complete  PCP or Specialist Appt within 3-5 Days   Not Complete  HRI or Home Care Consult  Complete Complete  Social Work Consult for Recovery Care Planning/Counseling  Complete Complete  Palliative Care Screening  Not Applicable Not Applicable  Medication Review Oceanographer) Complete Complete Complete  PCP or Specialist appointment within 3-5 days of discharge Complete    HRI or Home Care Consult Complete    SW Recovery Care/Counseling Consult Complete    Palliative Care Screening Not Applicable    Skilled Nursing Facility Not Applicable

## 2024-04-02 NOTE — Care Plan (Signed)
 DC packet has been handed to pt's son with some questions asked and answered

## 2024-04-02 NOTE — Plan of Care (Signed)
   Problem: Education: Goal: Ability to describe self-care measures that may prevent or decrease complications (Diabetes Survival Skills Education) will improve Outcome: Progressing   Problem: Health Behavior/Discharge Planning: Goal: Ability to manage health-related needs will improve Outcome: Progressing   Problem: Nutritional: Goal: Maintenance of adequate nutrition will improve Outcome: Progressing

## 2024-04-02 NOTE — Discharge Summary (Signed)
 Physician Discharge Summary   Patient: [name] MRN: [id number] DOB: Apr 13, 1932  Admit date:     03/26/2024  Discharge date: 04/02/24  Discharge Physician: Lonni SHAUNNA Dalton   PCP: [name], MD     Recommendations at discharge:  Follow up with GI Dr. Cinderella for abdominal pain in 1-2 weeks Follow up with Urology Dr. Matilda in 2-4 weeks for recurrent UTI Follow up with PCP Dr. Alphonsa Recommend to consult Palliative Care to follow at [facility]     Discharge Diagnoses: Principal Problem:   Intractable nausea and vomiting Active Problems:   Recurrent UTI   Abdominal pain, chronic, epigastric   Mesenteric angina doubted   Type 2 diabetes mellitus with atherosclerosis of aorta (HCC)   Hyperlipidemia associated with type 2 diabetes mellitus (HCC)   Essential hypertension   Peripheral vascular disease   Chronic mesenteric atherosclerosis      Hospital Course: 89 y.o. F with HTN, HLD, DM, recurrent UTI, and hx SMA stent in 04/2023 for chronic mesenteric ischemia who presented with waxing and waning abdominal burning pain, nausea and anorexia.    In the ER, CTA abdomen showed no acute findings.  SMA stent patent but celiac artery with known stenosis.  The assumption was made that her abdominal burning was from celiac stenosis, so she was sent to [facility] for IR evaluation.     Abdominal burning, nausea Patient was admitted with initial concern for acute mesenteric angina.  She was evaluated by IR, and the case was discussed with vascular surgery.  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  She was able to advance her diet, and symptoms improved.  She was discharged on  oxycodone .  Would recommend GI follow up first, for re-evaluation of symptoms, which may be functional in nature, or due to gastritis.       UTI History of recurrent UTI I do not believe this is the cause of her abdominal pain, but she did report dysuria and urinary retention on admission, and urine culture grew pansensitive E coli.  Treated with 5 days Unasyn.  Family had many questions about treatment of recurrent UTIs, which appear to be managed as well as possible by Urology.   Urinary retention This was transient.  Likely due to constipation vs UTI vs both.   Right groin pseudoaneurysm Noted by IR.  Asymptomatic, so no treatment recommended.   History of C. difficile No active symptoms here.   Anxiety Stable on home Xanax    Normocytic anemia Hemoglobin stable, no clinical bleeding                  The [place]  Controlled Substances Registry was reviewed for this patient prior to discharge.  Consultants: Palliative Care Interventional radiology    Disposition: Assisted living Diet recommendation:  Regular diet  DISCHARGE MEDICATION: Allergies as of 04/02/2024       Reactions   Statins Other (See Comments)   Severe myalgias   Phenergan [promethazine Hcl] Other (See Comments)   Hallucinations    Hydrocortisone Itching   Lisinopril Other (See Comments)   Lethargy, Fatigue   Prednisone  Rash   Trazodone  And Nefazodone Cough  Medication List     TAKE these medications    acetaminophen  325 MG tablet Commonly known as: TYLENOL  TAKE (2) TABLETS BY MOUTH EVERY SIX HOURS AS NEEDED FOR PAIN. MAX 3GM IN 24 HOURS.   albuterol  108 (90 Base) MCG/ACT inhaler Commonly known as: VENTOLIN  HFA INHALE 2 PUFFS INTO THE LUNGS EVERY SIX HOURS AS NEEDED FOR WHEEZING OR SHORTNESS OF BREATH.   ALPRAZolam  0.25 MG tablet Commonly known as: XANAX  Take 1 tablet (0.25 mg total) by mouth at bedtime. What changed: See the new instructions.   amLODipine  10  MG tablet Commonly known as: NORVASC  TAKE (1) TABLET BY MOUTH ONCE DAILY.   Aspirin  Low Dose 81 MG chewable tablet Generic drug: aspirin  CHEW (1) TABLET BY MOUTH ONCE DAILY.   Beta Carotene Provitamin A 25000 units capsule Generic drug: beta carotene TAKE (1) CAPSULE BY MOUTH AT BEDTIME.   bisacodyl  5 MG EC tablet Generic drug: bisacodyl  TAKE 1 TABLET BY MOUTH ONCE DAILY AS NEEDED FOR MODERATE CONSTIPATION.   cyanocobalamin  1000 MCG tablet Take 1,000 mcg by mouth at bedtime.   EasyMax Test test strip Generic drug: glucose blood CHECK BLOOD SUGAR ONCE DAILY.(CALL MD IF BS BELOW 60: OR IF BS ABOVE 400)   estradiol  0.1 MG/GM vaginal cream Commonly known as: ESTRACE  Discard plastic applicator. Insert a blueberry size amount (approximately 1 gram) of cream on fingertip inside vagina at bedtime every night for 1 week then every other night. For long term use.   gabapentin  100 MG capsule Commonly known as: NEURONTIN  1 nightly for neuropathy What changed:  how much to take how to take this when to take this additional instructions   hydrALAZINE  25 MG tablet Commonly known as: APRESOLINE  Take 1 tablet (25 mg total) by mouth 3 (three) times daily.   iVIZIA Dry Eyes 0.5 % Soln Generic drug: Povidone (PF) Place 1 drop into both eyes 2 (two) times daily.   ketoconazole  2 % cream Commonly known as: NIZORAL  Apply 1 Application topically 2 (two) times daily. For 10 days to groin rash   Melatonin 10 MG Tabs Take 10 mg by mouth at bedtime.   Metamucil 4 in 1 Fiber 25 % Pack Generic drug: Psyllium Take by mouth 2 (two) times daily.   metFORMIN  500 MG tablet Commonly known as: GLUCOPHAGE  TAKE (1) TABLET BY MOUTH IN THE MORNING & (1/2) TABLET (250MG ) BY MOUTH AT SUPPER   methenamine  1 g tablet Commonly known as: HIPREX  Take 1 tablet (1 g total) by mouth 2 (two) times daily with a meal. Most effective when taken with a daily Vitamin C supplement.   mupirocin  ointment 2  % Commonly known as: BACTROBAN  Apply thin amount twice daily to rash in groin creases twice daily for 5 days at a time when rash occurs   ondansetron  4 MG disintegrating tablet Commonly known as: ZOFRAN -ODT 4mg  ODT q4 hours prn nausea/vomit   oxyCODONE -acetaminophen  5-325 MG tablet Commonly known as: PERCOCET/ROXICET Take 1 tablet by mouth every 4 (four) hours as needed for moderate pain (pain score 4-6) or severe pain (pain score 7-10).   pantoprazole  40 MG tablet Commonly known as: PROTONIX  Take 1 tablet (40 mg total) by mouth 2 (two) times daily before a meal.   polyethylene glycol powder 17 GM/SCOOP powder Commonly known as: GoodSense ClearLax 1 scoop in 8 ounces water  once daily as needed for constipation   PreserVision AREDS 2 Caps Take 1 capsule by mouth in the morning and at bedtime.   Restasis  0.05 %  ophthalmic emulsion Generic drug: cycloSPORINE  Place 1 drop into both eyes 2 (two) times daily.   Safety Lancets 28G Misc CHECK BLOOD SUGAR ONCE DAILY.   senna-docusate 8.6-50 MG tablet Commonly known as: Senokot-S Take 1 tablet by mouth at bedtime.   sodium chloride  1 g tablet TAKE (1) TABLET BY MOUTH TWICE DAILY WITH A MEAL.   tamsulosin  0.4 MG Caps capsule Commonly known as: FLOMAX  Take 1 capsule (0.4 mg total) by mouth daily.   valsartan  320 MG tablet Commonly known as: DIOVAN  Take 1 tablet (320 mg total) by mouth daily.   Vitamin D  (Cholecalciferol ) 10 MCG (400 UNIT) Tabs TAKE (1) TABLET BY MOUTH ONCE DAILY.        Follow-up Information     [name], MD Follow up.   Specialty: Family Medicine Contact information: [address] [place] [address] [phone]         Matilda Senior, MD Follow up.   Specialty: Urology Contact information: [address]  [place] [address] [phone]         Cinderella Deatrice FALCON, MD .   Specialty: Gastroenterology Contact information: [address] [place] [address] [phone]                 Discharge Instructions     Discharge instructions   Complete by: As directed    **IMPORTANT DISCHARGE INSTRUCTIONS**   From Dr. Jonel: You were admitted for abdominal pain  Here, your abdominal imaging showed no new findings (no new masses or tumors, no infection, no problems with the pancreas, gallbladder, or intestines). It did show that your SMA stent is open and working well and blood is passing through it well. You do have blockage in the celiac artery, but this doesn't appear significantly changed.  We reviewed this with our Interventional Radiology and vascular surgery partners, and they both think this is likely NOT the cause of pain.  In the last year, you have GAINED weight, which makes us  think blockage of arteries to the abdomen are not the issue causing your burning abdominal pain.  You have some options: - Go see your Gastroenterologist, Dr. Cinderella in [place], and ask for his help working up the abdominal pain - If you and Dr. Cinderella think, after some time, that the blockage in the artery needs to be looked at again, go see a Vascular Surgeon in [place] (ask for a referral to Vascular Surgery)   For your recurrent urinary tract infections, follow up with your Urologist.  It appears to me that you're doing everything you can to prevent them.   Increase activity slowly   Complete by: As directed        Discharge Exam: Filed Weights   03/26/24 1304  Weight: 79 kg    General: Pt is alert, awake, not in acute distress Cardiovascular: RRR, nl S1-S2, no murmurs appreciated.   No LE edema.   Respiratory: Normal respiratory rate and rhythm.  CTAB without rales or wheezes. Abdominal: Abdomen soft and non-tender.  No distension or HSM.   Neuro/Psych: Strength symmetric in upper and lower extremities.  Judgment and insight appear normal.   Condition at discharge: good  The results of significant  diagnostics from this hospitalization (including imaging, microbiology, ancillary and laboratory) are listed below for reference.   Imaging Studies: CT ABDOMEN PELVIS WO CONTRAST Result Date: 03/28/2024 EXAM: CT ABDOMEN AND PELVIS WITHOUT CONTRAST 03/28/2024 10:10:16 AM TECHNIQUE: CT of the  abdomen and pelvis was performed without the administration of intravenous contrast. Multiplanar reformatted images are provided for review. Automated exposure control, iterative reconstruction, and/or weight-based adjustment of the mA/kV was utilized to reduce the radiation dose to as low as reasonably achievable. COMPARISON: CT angio abdomen and pelvis with contrast 03/26/2024. CLINICAL HISTORY: Abdominal pain, acute, nonlocalized; worsening abdominal pain. FINDINGS: LOWER CHEST: No acute abnormality. LIVER: A small cyst or hemangioma near the dome of the liver is stable. GALLBLADDER AND BILE DUCTS: Multiple gallstones are again noted at the neck of the gallbladder. No inflammatory changes or associated. No biliary ductal dilatation. SPLEEN: No acute abnormality. PANCREAS: No acute abnormality. ADRENAL GLANDS: No acute abnormality. KIDNEYS, URETERS AND BLADDER: No stones in the kidneys or ureters. No hydronephrosis. No perinephric or periureteral stranding. Mild bladder wall thickening is less prominent than on the previous study. GI AND BOWEL: Stomach demonstrates no acute abnormality. Diverticular changes are again noted throughout the descending and sigmoid colon without focal inflammation to suggest diverticulitis. There is no bowel obstruction. PERITONEUM AND RETROPERITONEUM: No ascites. No free air. VASCULATURE: Extensive atherosclerotic changes are again noted in the aorta. Stent at the origin of the SMA is noted. LYMPH NODES: No lymphadenopathy. REPRODUCTIVE ORGANS: No acute abnormality. BONES AND SOFT TISSUES: Ventral hernia repair. No acute osseous abnormality. No focal soft tissue abnormality. IMPRESSION: 1. No  acute findings. 2. Multiple gallstones at the neck of the gallbladder without inflammatory changes. 3. Diverticular changes in the descending and sigmoid colon without evidence of diverticulitis. Electronically signed by: Lonni Necessary MD 03/28/2024 10:47 AM EDT RP Workstation: [id number]   CT Angio Abd/Pel W and/or Wo Contrast Result Date: 03/26/2024 CLINICAL DATA:  Epigastric pain radiating to lower abdomen for 3-4 days, progressive worsening, nausea, history of mesenteric artery stenosis EXAM: CTA ABDOMEN AND PELVIS WITHOUT AND WITH CONTRAST TECHNIQUE: Multidetector CT imaging of the abdomen and pelvis was performed using the standard protocol during bolus administration of intravenous contrast. Multiplanar reconstructed images and MIPs were obtained and reviewed to evaluate the vascular anatomy. RADIATION DOSE REDUCTION: This exam was performed according to the departmental dose-optimization program which includes automated exposure control, adjustment of the mA and/or kV according to patient size and/or use of iterative reconstruction technique. CONTRAST:  80mL OMNIPAQUE  IOHEXOL  350 MG/ML SOLN COMPARISON:  03/09/2024, 02/24/2024 FINDINGS: VASCULAR Aorta: Normal caliber aorta without aneurysm, dissection, vasculitis or significant stenosis. Diffuse atherosclerosis unchanged. 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 Distal branches of the celiac are patent, with no evidence of aneurysm, dissection, or vasculitis. SMA: There is a stent at the origin of the SMA, which appears widely patent. Distal branches of the SMA are normal, with no evidence of aneurysm, dissection, or vasculitis. Renals: There is significant stenosis at the origin of the bilateral main renal arteries, estimated 70-90% stenosis on the right and 50-70% stenosis on the left. There are 2 additional accessory left renal arteries arising from the aorta, supplying the lower pole left kidney,  which appear widely patent throughout their courses. No evidence of aneurysm, dissection, vasculitis, or fibromuscular dysplasia. IMA: The IMA is not severe stenosis at the origin of the IMA, though the distal branches are normal caliber and widely patent, likely via collateral flow. No evidence of aneurysm, dissection, or vasculitis. Inflow: Patent without evidence of aneurysm, dissection, vasculitis or significant stenosis. Stable diffuse atherosclerosis. Proximal Outflow: Pseudoaneurysm of the right common femoral artery is again noted, measuring approximately 2.3 x 2.2 cm, previously measuring 2.2 x  3.0 cm on the 09/22/2023 exam. Significant mural thrombus has developed within the pseudoaneurysm since the prior exams, with a thin patent neck and minimal central flow identified on this study. If further evaluation is clinically indicated, ultrasound could be performed. The bilateral common femoral, superficial femoral, and profundus femoral arteries are patent. No dissection, vasculitis, or critical stenosis. Diffuse atherosclerosis unchanged. Veins: No obvious venous abnormality within the limitations of this arterial phase study. Review of the MIP images confirms the above findings. NON-VASCULAR Lower chest: No acute pleural or parenchymal lung disease. Hepatobiliary: Calcified gallstones without cholecystitis. Unremarkable appearance of the liver. No biliary duct dilation or choledocholithiasis. Pancreas: Unremarkable. No pancreatic ductal dilatation or surrounding inflammatory changes. Spleen: Normal in size without focal abnormality. Adrenals/Urinary Tract: Mild diffuse bladder wall thickening and perivesicular fat stranding concerning for cystitis. Please correlate with urinalysis. Stable bilateral renal cortical thinning. No abnormal renal enhancement. No urinary tract calculi or obstructive uropathy. The adrenals are stable. Stomach/Bowel: No bowel obstruction or ileus. The appendix is surgically absent.  Scattered colonic diverticulosis, most pronounced within the ascending colon, with no evidence of acute diverticulitis. There is no bowel wall thickening or inflammatory change. No CT findings of bowel wall ischemia. Lymphatic: No pathologic adenopathy. Reproductive: Status post hysterectomy. No adnexal masses. Other: No free fluid or free intraperitoneal gas. No abdominal wall hernia. Prior ventral hernia repair. Musculoskeletal: No acute or destructive bony abnormalities. Reconstructed images demonstrate no additional findings. IMPRESSION: VASCULAR 1. Stent within the origin of the superior mesenteric artery, which appears widely patent. 2. 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 3. 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 Two accessory left renal artery supplying the lower pole are widely patent. 4. Chronic right common femoral artery pseudoaneurysm, which has decreased in overall size and developed increased mural thrombus since prior exams. A thin patent neck and minimal central enhancement are identified. If further evaluation is clinically indicated, ultrasound could be performed. 5.  Aortic Atherosclerosis (ICD10-I70.0). NON-VASCULAR 1. Mild diffuse bladder wall thickening and perivesicular fat stranding, compatible with cystitis. Please correlate with urinalysis. 2. Cholelithiasis without cholecystitis. 3. Scattered colonic diverticulosis without diverticulitis. Electronically Signed   By: Ozell Daring M.D.   On: 03/26/2024 15:47   US  MESENTERIC ARTERIES Result Date: 03/17/2024 CLINICAL DATA:  Mesenteric artery stenosis. Status post stenting of superior mesenteric artery. EXAM: US  MESENTERIC ARTERIAL DOPPLER COMPARISON:  CT abdomen and pelvis 02/24/2024 and visceral angiogram 04/11/2023 FINDINGS: Celiac axis: 275 cm/sec Celiac axis with inspiration: 221 cm/sec Celiac axis with  expiration: 291 cm/sec Splenic artery: 250 cm/sec Hepatic artery: 202 cm/sec SMA: Proximal 299 cm/sec; mid 359 cm/sec; distal 181 cm/sec IMA: 231 cm/sec Aorta: 128 cm/sec Aortic size: 1.9 cm proximally, 1.5 cm in the mid segment and 1.3 cm distally IMPRESSION: 1. SMA is patent. Limited evaluation of the stent. Elevated velocities in the proximal SMA raises concern for intra stent stenosis. 2. Elevated peak systolic velocity in the celiac artery is suggestive for greater than 70% stenosis. Electronically Signed   By: Juliene Balder M.D.   On: 03/17/2024 09:05   DG Abd 2 Views Result Date: 03/17/2024 CLINICAL DATA:  History of recent C difficile infection, occasional diarrhea EXAM: ABDOMEN - 2 VIEW COMPARISON:  02/24/2027 FINDINGS: Scattered large and small bowel gas is noted. Multiple gallstones are noted in the right upper quadrant. No free air is seen. Mild retained fecal material is noted  within the right colon. No constipation is seen. Changes of prior anterior abdominal wall surgery are noted. IMPRESSION: Mild retained fecal material in the right colon. Cholelithiasis. Electronically Signed   By: Oneil Devonshire M.D.   On: 03/17/2024 03:54    Microbiology: Results for orders placed or performed during the hospital encounter of 03/26/24  Urine Culture     Status: Abnormal   Collection Time: 03/26/24  3:56 PM   Specimen: Urine, Clean Catch  Result Value Ref Range Status   Specimen Description   Final    URINE, CLEAN CATCH Performed at [facility], [address] [place], [place] [address]    Special Requests   Final    NONE Performed at [facility], [address], [place], [place] [address]    Culture >=100,000 COLONIES/mL ESCHERICHIA COLI (A)  Final   Report Status 03/29/2024 FINAL  Final   Organism ID, Bacteria ESCHERICHIA COLI (A)  Final      Susceptibility   Escherichia coli - MIC*    AMPICILLIN <=2 SENSITIVE Sensitive     CEFAZOLIN  (URINE) Value in next row Sensitive      <=1  SENSITIVEThis is a modified FDA-approved test that has been validated and its performance characteristics determined by the reporting laboratory.  This laboratory is certified under the Clinical Laboratory Improvement Amendments CLIA as qualified to perform high complexity clinical laboratory testing.    CEFEPIME  Value in next row Sensitive      <=1 SENSITIVEThis is a modified FDA-approved test that has been validated and its performance characteristics determined by the reporting laboratory.  This laboratory is certified under the Clinical Laboratory Improvement Amendments CLIA as qualified to perform high complexity clinical laboratory testing.    ERTAPENEM Value in next row Sensitive      <=1 SENSITIVEThis is a modified FDA-approved test that has been validated and its performance characteristics determined by the reporting laboratory.  This laboratory is certified under the Clinical Laboratory Improvement Amendments CLIA as qualified to perform high complexity clinical laboratory testing.    CEFTRIAXONE  Value in next row Sensitive      <=1 SENSITIVEThis is a modified FDA-approved test that has been validated and its performance characteristics determined by the reporting laboratory.  This laboratory is certified under the Clinical Laboratory Improvement Amendments CLIA as qualified to perform high complexity clinical laboratory testing.    CIPROFLOXACIN  Value in next row Sensitive      <=1 SENSITIVEThis is a modified FDA-approved test that has been validated and its performance characteristics determined by the reporting laboratory.  This laboratory is certified under the Clinical Laboratory Improvement Amendments CLIA as qualified to perform high complexity clinical laboratory testing.    GENTAMICIN Value in next row Sensitive      <=1 SENSITIVEThis is a modified FDA-approved test that has been validated and its performance characteristics determined by the reporting laboratory.  This laboratory is  certified under the Clinical Laboratory Improvement Amendments CLIA as qualified to perform high complexity clinical laboratory testing.    NITROFURANTOIN  Value in next row Sensitive      <=1 SENSITIVEThis is a modified FDA-approved test that has been validated and its performance characteristics determined by the reporting laboratory.  This laboratory is certified under the Clinical Laboratory Improvement Amendments CLIA as qualified to perform high complexity clinical laboratory testing.    TRIMETH /SULFA  Value in next row Sensitive      <=1 SENSITIVEThis is a modified FDA-approved test that has been validated and its performance characteristics determined  by the reporting laboratory.  This laboratory is certified under the Clinical Laboratory Improvement Amendments CLIA as qualified to perform high complexity clinical laboratory testing.    AMPICILLIN/SULBACTAM Value in next row Sensitive      <=1 SENSITIVEThis is a modified FDA-approved test that has been validated and its performance characteristics determined by the reporting laboratory.  This laboratory is certified under the Clinical Laboratory Improvement Amendments CLIA as qualified to perform high complexity clinical laboratory testing.    PIP/TAZO Value in next row Sensitive      <=4 SENSITIVEThis is a modified FDA-approved test that has been validated and its performance characteristics determined by the reporting laboratory.  This laboratory is certified under the Clinical Laboratory Improvement Amendments CLIA as qualified to perform high complexity clinical laboratory testing.    MEROPENEM Value in next row Sensitive      <=4 SENSITIVEThis is a modified FDA-approved test that has been validated and its performance characteristics determined by the reporting laboratory.  This laboratory is certified under the Clinical Laboratory Improvement Amendments CLIA as qualified to perform high complexity clinical laboratory testing.    * >=100,000  COLONIES/mL ESCHERICHIA COLI    Labs: CBC: Recent Labs  Lab 03/26/24 1326 03/27/24 0542 03/28/24 0301 03/29/24 0250 03/30/24 0216 03/31/24 0521 04/01/24 0246  WBC 7.1   < > 5.9 6.5 6.6 5.9 6.5  NEUTROABS 4.4  --   --   --   --   --   --   HGB 10.6*   < > 10.0* 10.1* 10.0* 10.8* 10.3*  HCT 34.0*   < > 30.7* 32.1* 33.1* 34.0* 32.5*  MCV 87.6   < > 86.0 87.7 86.0 86.3 86.2  PLT 251   < > 257 255 271 273 251   < > = values in this interval not displayed.   Basic Metabolic Panel: Recent Labs  Lab 03/28/24 0301 03/29/24 0250 03/30/24 0216 03/31/24 0521 04/01/24 0246  NA 134* 134* 136 137 135  K 4.7 4.3 4.5 4.3 4.2  CL 98 98 97* 101 101  CO2 28 26 26 24 25   GLUCOSE 130* 128* 119* 135* 114*  BUN 24* 23 19 20 18   CREATININE 0.97 0.95 1.07* 0.96 0.84  CALCIUM 9.1 8.8* 8.9 9.1 8.9  MG 1.9 1.8 2.0 2.1  --    Liver Function Tests: Recent Labs  Lab 03/28/24 0301 03/29/24 0250 03/30/24 0216 03/31/24 0521 04/01/24 0246  AST 14* 15 21 16 15   ALT 13 12 11 12 13   ALKPHOS 78 68 71 73 64  BILITOT 0.6 0.7 0.8 0.6 0.9  PROT 5.8* 5.7* 5.9* 6.1* 5.9*  ALBUMIN 3.0* 3.0* 3.2* 3.2* 3.2*   CBG: Recent Labs  Lab 04/01/24 0819 04/01/24 1230 04/01/24 1726 04/01/24 2116 04/02/24 0735  GLUCAP 225* 106* 159* 112* 164*    Discharge time spent: approximately 45 minutes spent on discharge counseling, evaluation of patient on day of discharge, and coordination of discharge planning with nursing, social work, pharmacy and case management  Signed: Lonni SHAUNNA Dalton, MD [facility] 04/02/2024

## 2024-04-02 NOTE — NC FL2 (Signed)
 [place]  MEDICAID FL2 LEVEL OF CARE FORM     IDENTIFICATION  Patient Name: [name] Birthdate: Dec 10, 1931 Sex: female Admission Date (Current Location): 03/26/2024  [facility] and [place] Number:  [employer] and Address:  [facility], [address] [place], [place] [address]      Provider Number: [id number]  Attending Physician Name and Address:  Jonel Lonni SQUIBB, *  Relative Name and Phone Number:       Current Level of Care: Hospital Recommended Level of Care: Assisted Living Facility Prior Approval Number:    Date Approved/Denied:   PASRR Number: [id number]  Discharge Plan: SNF    Current Diagnoses: Patient Active Problem List   Diagnosis Date Noted   DNR (do not resuscitate) discussion 04/01/2024   Occlusive mesenteric ischemia 03/31/2024   Palliative care by specialist 03/29/2024   DNR (do not resuscitate) 03/29/2024   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   Skin rash 03/24/2024   Lesion of nose 03/24/2024   History of Clostridioides difficile colitis 02/18/2024   Overflow diarrhea 02/18/2024   Abnormal findings on diagnostic imaging of other specified body structures 02/18/2024   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/15/2024   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 01/15/2024   Recurrent UTI 01/15/2024   Type 2 diabetes mellitus with hyperglycemia (HCC) 12/28/2023   UTI (urinary tract infection) 12/27/2023   Frequency of urination 12/24/2023   Acute blood loss anemia 04/17/2023   Gastritis and gastroduodenitis 04/10/2023   Abdominal pain, chronic, epigastric 04/08/2023   Mesenteric artery stenosis 04/07/2023   Diabetic neuropathy (HCC) 11/30/2022   S/P total knee arthroplasty, right 04/12/2022   Aortic atherosclerosis 02/25/2022   Chronic non-seasonal allergic rhinitis 02/25/2022   GERD (gastroesophageal reflux disease) 02/17/2022   Myalgia due to statin 02/15/2020    Cognitive dysfunction 03/04/2019   Generalized anxiety disorder 06/05/2018   Hyponatremia 03/08/2018   Osteoarthritis of right knee 12/02/2017   Hyperlipidemia associated with type 2 diabetes mellitus (HCC) 10/02/2016   Insomnia 10/02/2016   Essential hypertension 12/15/2014   Type 2 diabetes mellitus with atherosclerosis of aorta (HCC) 02/15/2011    Orientation RESPIRATION BLADDER Height & Weight     Self, Time, Situation, Place  Normal Continent Weight: 174 lb 2.6 oz (79 kg) Height:  5' 8 (172.7 cm)  BEHAVIORAL SYMPTOMS/MOOD NEUROLOGICAL BOWEL NUTRITION STATUS      Continent Diet (See DC Summary)  AMBULATORY STATUS COMMUNICATION OF NEEDS Skin   Limited Assist Verbally Normal                       Personal Care Assistance Level of Assistance  Bathing, Feeding, Dressing Bathing Assistance: Limited assistance Feeding assistance: Limited assistance Dressing Assistance: Limited assistance     Functional Limitations Info  Sight, Hearing, Speech Sight Info: Impaired Hearing Info: Adequate Speech Info: Adequate    SPECIAL CARE FACTORS FREQUENCY                       Contractures Contractures Info: Not present    Additional Factors Info  Code Status, Allergies Code Status Info: DNR-Limited Allergies Info: Statins; Phenergan (Promethazine Hcl); Hydrocortisone; :isinopril; Prednisone ; Trazodone  & Nefazodone           Current Medications (04/02/2024):  This is the current hospital active medication list Current Facility-Administered Medications  Medication Dose Route Frequency Provider Last Rate Last Admin   acetaminophen  (TYLENOL ) tablet 650 mg  650 mg  Oral Q6H PRN Elgergawy, Dawood S, MD   650 mg at 03/27/24 1417   Or   acetaminophen  (TYLENOL ) suppository 650 mg  650 mg Rectal Q6H PRN Elgergawy, Dawood S, MD       albuterol  (PROVENTIL ) (2.5 MG/3ML) 0.083% nebulizer solution 2.5 mg  2.5 mg Inhalation Q6H PRN Elgergawy, Dawood S, MD       ALPRAZolam   (XANAX ) tablet 0.25 mg  0.25 mg Oral QHS PRN Elgergawy, Dawood S, MD   0.25 mg at 04/01/24 2149   amLODipine  (NORVASC ) tablet 10 mg  10 mg Oral Daily Shona Laurence N, DO   10 mg at 04/02/24 9161   artificial tears ophthalmic solution 1 drop  1 drop Both Eyes BID Elgergawy, Brayton RAMAN, MD   1 drop at 04/02/24 0815   aspirin  chewable tablet 81 mg  81 mg Oral Daily Elgergawy, Dawood S, MD   81 mg at 04/02/24 9161   bisacodyl  (DULCOLAX) suppository 10 mg  10 mg Rectal Daily PRN Elgergawy, Dawood S, MD   10 mg at 03/31/24 1307   clopidogrel  (PLAVIX ) tablet 75 mg  75 mg Oral Daily Elgergawy, Dawood S, MD   75 mg at 04/02/24 9162   cyanocobalamin  (VITAMIN B12) tablet 1,000 mcg  1,000 mcg Oral QHS Elgergawy, Dawood S, MD   1,000 mcg at 04/01/24 2146   cycloSPORINE  (RESTASIS ) 0.05 % ophthalmic emulsion 1 drop  1 drop Both Eyes BID Elgergawy, Dawood S, MD   1 drop at 04/02/24 9161   docusate sodium  (COLACE) capsule 100 mg  100 mg Oral BID Elgergawy, Dawood S, MD   100 mg at 04/02/24 9161   enoxaparin  (LOVENOX ) injection 40 mg  40 mg Subcutaneous Q24H Elgergawy, Dawood S, MD   40 mg at 04/01/24 2144   estradiol  (ESTRACE ) vaginal cream 1 Applicatorful  1 Applicatorful Vaginal QHS Elgergawy, Brayton RAMAN, MD   1 Applicatorful at 03/30/24 2200   feeding supplement (GLUCERNA SHAKE) (GLUCERNA SHAKE) liquid 237 mL  237 mL Oral TID BM Elgergawy, Dawood S, MD   237 mL at 04/02/24 0857   gabapentin  (NEURONTIN ) capsule 100 mg  100 mg Oral q AM Elgergawy, Dawood S, MD   100 mg at 04/02/24 9361   hydrALAZINE  (APRESOLINE ) injection 5 mg  5 mg Intravenous Q4H PRN Elgergawy, Dawood S, MD   5 mg at 03/30/24 9092   hydrALAZINE  (APRESOLINE ) tablet 25 mg  25 mg Oral TID Elgergawy, Dawood S, MD   25 mg at 04/02/24 9161   insulin  aspart (novoLOG ) injection 0-9 Units  0-9 Units Subcutaneous TID WC Elgergawy, Dawood S, MD   2 Units at 04/02/24 9160   irbesartan  (AVAPRO ) tablet 150 mg  150 mg Oral Daily Gherghe, Costin M, MD   150 mg at  04/02/24 9161   liver oil-zinc  oxide (DESITIN) 40 % ointment   Topical QID PRN Danford, Lonni SQUIBB, MD       melatonin tablet 9 mg  9 mg Oral QHS Elgergawy, Dawood S, MD   9 mg at 04/01/24 2144   methenamine  (MANDELAMINE) tablet 1 g  1 g Oral BID WC Elgergawy, Dawood S, MD   1 g at 04/02/24 0839   multivitamin (PROSIGHT) tablet 1 tablet  1 tablet Oral Daily Elgergawy, Dawood S, MD   1 tablet at 04/02/24 9161   ondansetron  (ZOFRAN ) tablet 4 mg  4 mg Oral Q6H PRN Elgergawy, Dawood S, MD       Or   ondansetron  (ZOFRAN ) injection 4 mg  4  mg Intravenous Q6H PRN Elgergawy, Dawood S, MD       oxyCODONE -acetaminophen  (PERCOCET/ROXICET) 5-325 MG per tablet 1 tablet  1 tablet Oral Q4H PRN Jonel Lonni SQUIBB, MD   1 tablet at 04/02/24 9361   pantoprazole  (PROTONIX ) EC tablet 40 mg  40 mg Oral BID AC Elgergawy, Dawood S, MD   40 mg at 04/02/24 9161   polyethylene glycol (MIRALAX  / GLYCOLAX ) packet 17 g  17 g Oral Daily PRN Elgergawy, Dawood S, MD       tamsulosin  (FLOMAX ) capsule 0.4 mg  0.4 mg Oral Daily Elgergawy, Dawood S, MD   0.4 mg at 04/02/24 9162   vitamin A & D ointment   Topical PRN Gherghe, Costin M, MD   1 Application at 03/28/24 2051     Discharge Medications: Please see discharge summary for a list of discharge medications.  Relevant Imaging Results:  Relevant Lab Results:   Additional Information SSN: [id number]  Jeoffrey LITTIE Moose, LCSWA

## 2024-04-02 NOTE — Telephone Encounter (Signed)
 Kyra from [facility] called into the office today with general questions/concerns regarding patient had stent put in and needs to be seen asap Patient may be reached at [phone] to discuss questions.

## 2024-04-02 NOTE — Progress Notes (Signed)
 Mobility Specialist Progress Note:    04/02/24 1042  Mobility  Activity Ambulated with assistance  Level of Assistance Standby assist, set-up cues, supervision of patient - no hands on  Assistive Device Front wheel walker  Distance Ambulated (ft) 510 ft (x2)  Activity Response Tolerated well  Mobility Referral Yes  Mobility visit 1 Mobility  Mobility Specialist Start Time (ACUTE ONLY) 1030  Mobility Specialist Stop Time (ACUTE ONLY) 1042  Mobility Specialist Time Calculation (min) (ACUTE ONLY) 12 min   Received pt in chair having no complaints and agreeable to mobility. Pt was asymptomatic throughout ambulation and returned to room w/o fault. Left in chair w/ call bell in reach and all needs met.   Thersia Minder Mobility Specialist  Please contact vis Secure Chat or  Rehab Office [phone]

## 2024-04-02 NOTE — Care Plan (Signed)
 Report given to Kyra Ly, RN Supervisor with questions asked and answered.

## 2024-04-05 ENCOUNTER — Encounter: Payer: Self-pay | Admitting: Family Medicine

## 2024-04-05 ENCOUNTER — Ambulatory Visit (INDEPENDENT_AMBULATORY_CARE_PROVIDER_SITE_OTHER): Payer: Self-pay | Admitting: Family Medicine

## 2024-04-05 VITALS — BP 144/78 | HR 62 | Temp 97.2°F | Ht 68.0 in | Wt 174.0 lb

## 2024-04-05 DIAGNOSIS — E1142 Type 2 diabetes mellitus with diabetic polyneuropathy: Secondary | ICD-10-CM | POA: Diagnosis not present

## 2024-04-05 DIAGNOSIS — E785 Hyperlipidemia, unspecified: Secondary | ICD-10-CM

## 2024-04-05 DIAGNOSIS — E1159 Type 2 diabetes mellitus with other circulatory complications: Secondary | ICD-10-CM

## 2024-04-05 DIAGNOSIS — I10 Essential (primary) hypertension: Secondary | ICD-10-CM

## 2024-04-05 DIAGNOSIS — J3489 Other specified disorders of nose and nasal sinuses: Secondary | ICD-10-CM | POA: Diagnosis not present

## 2024-04-05 DIAGNOSIS — E1169 Type 2 diabetes mellitus with other specified complication: Secondary | ICD-10-CM | POA: Diagnosis not present

## 2024-04-05 DIAGNOSIS — R350 Frequency of micturition: Secondary | ICD-10-CM | POA: Diagnosis not present

## 2024-04-05 DIAGNOSIS — I700 Atherosclerosis of aorta: Secondary | ICD-10-CM

## 2024-04-05 MED ORDER — GABAPENTIN 100 MG PO CAPS: ORAL_CAPSULE | ORAL | 3 refills | Status: DC

## 2024-04-05 NOTE — Progress Notes (Signed)
   Subjective:    Patient ID: [name], female    DOB: 10/28/1931, 89 y.o.   MRN: [id number]  HPI Hospital follow up for mesentery artery stenosis   Discussed the use of AI scribe software for clinical note transcription with the patient, who gave verbal consent to proceed.  History of Present Illness   [name] is a 89 year old female who presents for follow-up after hospitalization for a mild UTI and ongoing management of abdominal pain.  She was recently hospitalized for eight days for a mild urinary tract infection (UTI) and was treated with intravenous Unasyn. Since her discharge around October 3rd, her urinary symptoms have improved, with less frequent urination and better urine output.  She experiences abdominal pain, managed with oxycodone  and Tylenol , prescribed at 5 mg up to four times a day. She takes it infrequently, usually once in the morning and at night, primarily for belly pain. Her bowel movements are regular, aided by a 'little pink stuff' she takes, and she denies severe diarrhea issues.  She has been prescribed gabapentin , which she currently takes at night, but has discussed with her provider the possibility of taking it in the morning to help manage her symptoms. She takes one dose daily and finds it effective.  She walks regularly at her rest home , achieving 500-800 steps daily. She did not engage in therapy on the morning of the visit due to other commitments but maintains an active lifestyle.  She has a persistent spot on her nose, treated with antibiotic cream for three to four months. It occasionally bleeds when she washes her face but is not painful or tender.  She sleeps well, averaging six to seven hours per night, and describes her stress levels as manageable, taking things 'one day at a time.'         Review of Systems     Objective:   Physical Exam General-in no acute distress Eyes-no discharge Lungs-respiratory rate normal,  CTA CV-no murmurs,RRR Extremities skin warm dry no edema Neuro grossly normal Behavior normal, alert Blood pressure recheck overall good  She does have a lesion on her nose needs referral to dermatology     Assessment & Plan:   Abdominal pain Intermittent abdominal pain managed with oxycodone  and Tylenol , used sparingly to avoid dependency. - Continue oxycodone  and Tylenol  as needed, preferably at night. - Encourage minimal use to avoid dependency.  Constipation, risk due to opioid use Risk of constipation due to opioid use, managed with pink laxative solution. - Continue pink laxative solution.  Chronic neuropathic pain Chronic neuropathic pain managed with gabapentin , preferred in the morning to avoid drowsiness. - Adjust gabapentin  to morning administration.  Nasal skin lesion, possible skin cancer Nasal skin lesion present for 3-4 months, possible skin cancer, no prior evaluation. - Refer to dermatology for evaluation.  Hypertension Hypertension managed with hydralazine , improved blood pressure readings. - Continue hydralazine  regimen.  Diabetes decent control check A1c before next visit Hyperlipidemia decent control and check lipid profile before next visit High risk med check med 7 for next visit

## 2024-04-08 DIAGNOSIS — E1142 Type 2 diabetes mellitus with diabetic polyneuropathy: Secondary | ICD-10-CM | POA: Diagnosis not present

## 2024-04-08 DIAGNOSIS — B351 Tinea unguium: Secondary | ICD-10-CM | POA: Diagnosis not present

## 2024-04-08 DIAGNOSIS — L84 Corns and callosities: Secondary | ICD-10-CM | POA: Diagnosis not present

## 2024-04-09 NOTE — Progress Notes (Signed)
 Referring Physician(s): Established patient   Chief Complaint: The patient is seen in follow up today s/p SMA stent placement 04/11/23  History of present illness:  [name], 89 year old female, has a medical history significant for HTN, DM, diverticulitis, colonic perforation s/p hemicolectomy and chronic abdominal pain. She had numerous ED visits in 2024 for a variety of complaints including shortness of breath, intermittent diarrhea, weakness, poor appetite and unintentional weight loss. The patient reported significant abdominal discomfort and belching after eating. 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  She was hospitalized 04/07/2023-04/15/2023 and underwent further work up including an EGD. This showed no significant findings and the cause of her abdominal pain was hypothesized to be mesenteric ischemia. Interventional Radiology was consulted and on 04/11/23 she underwent a mesenteric angiogram with stent placement to the proximal SMA. She experienced some improvement in her abdominal pain post-procedure.  She was discharged from the hospital 04/15/2023 but she returned to the ED many times in 2025 for numerous issues including hypertension, generalized weakness, chest pain, the flu, UTIs, etc. On 03/26/24 she was hospitalized for abdominal pain, nausea and poor appetite. 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   Interventional Radiology was consulted and our team was not convinced her abdominal pain was related to mesenteric ischemia. IR did offer to perform a thrombin injection of the pseudoaneurysm. The patient ultimately ended up meeting with the Palliative team and she declined to pursue any procedures that were not expected to directly improve her abdominal pain.    She was discharged from the hospital 04/02/24 and she presents to the IR outpatient clinic today for follow up.  She is in severe abdominal pain which she describes as burning.  She is unable to eat without making the burning sensation worse.  The pain is hard to localize, but she reports it being diffusely throughout her abdomen.  She has had some mild nausea, no vomiting.  No blood in her stool.  She states that this pain is different than the ischemic pain she had one year ago.  She requests that if we are unable to help her she wants to go to hospice and die with dignity and be pain free.  Past Medical History:  Diagnosis Date   Blood transfusion without reported diagnosis    Cognitive dysfunction 03/04/2019   Patient scores 21 out of 30 on [place] cognitive assessment September 2020   Diabetes mellitus without complication (HCC)    Diabetic peripheral neuropathy associated with type 2 diabetes mellitus (HCC) 02/25/2022   Diverticulitis    Frailty 03/04/2019   H/O bilateral breast reduction surgery    Hyperlipidemia    a. intolerant to statins.    Hypertension     Past Surgical History:  Procedure Laterality Date   ABDOMINAL HYSTERECTOMY  2003   APPENDECTOMY     age 54   BIOPSY  04/10/2023   Procedure: BIOPSY;  Surgeon: Cinderella Deatrice FALCON, MD;  Location: [facility];  Service: Endoscopy;;   BREAST REDUCTION SURGERY     age 42   COLON SURGERY Left 2011   Hemicolectomy due to diverticulitis   ESOPHAGOGASTRODUODENOSCOPY (EGD) WITH PROPOFOL  N/A 04/10/2023   Procedure: ESOPHAGOGASTRODUODENOSCOPY (EGD) WITH PROPOFOL ;  Surgeon: Cinderella Deatrice FALCON, MD;  Location: [facility];  Service: Endoscopy;  Laterality: N/A;  EYE SURGERY  03/30/2009   cataract   IR ANGIOGRAM VISCERAL SELECTIVE  04/11/2023   IR INTRAVASCULAR ULTRASOUND NON CORONARY  04/11/2023   IR TRANSCATH PLC STENT 1ST ART NOT LE CV CAR VERT CAR  04/11/2023   IR US  GUIDE VASC ACCESS RIGHT  04/11/2023   KNEE SURGERY  Right 2003   knee cap   LAPAROSCOPIC INCISIONAL / UMBILICAL / VENTRAL HERNIA REPAIR  02/23/2007   REDUCTION MAMMAPLASTY Bilateral 2001   REFRACTIVE SURGERY     TOTAL KNEE ARTHROPLASTY Right 04/10/2022   Procedure: TOTAL KNEE ARTHROPLASTY;  Surgeon: Gerome Charleston, MD;  Location: [facility];  Service: Orthopedics;  Laterality: Right;  adductor canal    Allergies: Statins, Phenergan [promethazine hcl], Hydrocortisone, Lisinopril, Prednisone , and Trazodone  and nefazodone  Medications: Prior to Admission medications   Medication Sig Start Date End Date Taking? Authorizing Provider  acetaminophen  (TYLENOL ) 325 MG tablet TAKE (2) TABLETS BY MOUTH EVERY SIX HOURS AS NEEDED FOR PAIN. MAX 3GM IN 24 HOURS. 07/28/23   [name], MD  albuterol  (VENTOLIN  HFA) 108 (90 Base) MCG/ACT inhaler INHALE 2 PUFFS INTO THE LUNGS EVERY SIX HOURS AS NEEDED FOR WHEEZING OR SHORTNESS OF BREATH. 09/26/23   [name], MD  ALPRAZolam  (XANAX ) 0.25 MG tablet Take 1 tablet (0.25 mg total) by mouth at bedtime. 04/02/24   Danford, Lonni SQUIBB, MD  amLODipine  (NORVASC ) 10 MG tablet TAKE (1) TABLET BY MOUTH ONCE DAILY. 10/07/23   [name], MD  aspirin  (ASPIRIN  LOW DOSE) 81 MG chewable tablet CHEW (1) TABLET BY MOUTH ONCE DAILY. 09/06/23   [name], MD  BETA CAROTENE PROVITAMIN A 25000 units capsule TAKE (1) CAPSULE BY MOUTH AT BEDTIME. 02/24/24   Luking, Glendia LABOR, MD  BISACODYL  5 MG EC tablet TAKE 1 TABLET BY MOUTH ONCE DAILY AS NEEDED FOR MODERATE CONSTIPATION. 12/20/22   [name], MD  cyanocobalamin  1000 MCG tablet Take 1,000 mcg by mouth at bedtime.    [provider]  estradiol  (ESTRACE ) 0.1 MG/GM vaginal cream Discard plastic applicator. Insert a blueberry size amount (approximately 1 gram) of cream on fingertip inside vagina at bedtime every night for 1 week then every other night. For long term use. 01/15/24   Gerldine Lauraine BROCKS, FNP  gabapentin  (NEURONTIN ) 100 MG capsule 1 qam for neuropath-  new direction 04/05/24   Luking, Glendia LABOR, MD  glucose blood (EASYMAX TEST) test strip CHECK BLOOD SUGAR ONCE DAILY.(CALL MD IF BS BELOW 60: OR IF BS ABOVE 400) 10/07/23   Luking, Glendia LABOR, MD  hydrALAZINE  (APRESOLINE ) 25 MG tablet Take 1 tablet (25 mg total) by mouth 3 (three) times daily. 12/31/23   Luking, Glendia LABOR, MD  IVIZIA DRY EYES 0.5 % SOLN Place 1 drop into both eyes 2 (two) times daily. 09/22/23   [provider]  ketoconazole  (NIZORAL ) 2 % cream Apply 1 Application topically 2 (two) times daily. For 10 days to groin rash 03/03/24   [name], MD  Melatonin 10 MG TABS Take 10 mg by mouth at bedtime. 03/24/24   Grooms, Courtney, PA-C  METAMUCIL 4 IN 1 FIBER 25 % PACK Take by mouth 2 (two) times daily. 03/25/24   [provider]  metFORMIN  (GLUCOPHAGE ) 500 MG tablet TAKE (1) TABLET BY MOUTH IN THE MORNING & (1/2) TABLET (250MG ) BY MOUTH AT SUPPER 10/07/23   Luking, Glendia LABOR, MD  methenamine  (HIPREX ) 1 g tablet Take 1 tablet (1 g total) by mouth 2 (two) times daily with  a meal. Most effective when taken with a daily Vitamin C supplement. 01/15/24   Gerldine Lauraine BROCKS, FNP  Multiple Vitamins-Minerals (PRESERVISION AREDS 2) CAPS Take 1 capsule by mouth in the morning and at bedtime.    [provider]  mupirocin  ointment (BACTROBAN ) 2 % Apply thin amount twice daily to rash in groin creases twice daily for 5 days at a time when rash occurs 03/07/24   [name], MD  ondansetron  (ZOFRAN -ODT) 4 MG disintegrating tablet 4mg  ODT q4 hours prn nausea/vomit 09/05/23   Zammit, Joseph, MD  oxyCODONE -acetaminophen  (PERCOCET/ROXICET) 5-325 MG tablet Take 1 tablet by mouth every 4 (four) hours as needed for moderate pain (pain score 4-6) or severe pain (pain score 7-10). 04/02/24   Danford, Lonni SQUIBB, MD  pantoprazole  (PROTONIX ) 40 MG tablet Take 1 tablet (40 mg total) by mouth 2 (two) times daily before a meal. 12/24/23   Cook, Jayce G, DO  polyethylene glycol powder (GOODSENSE CLEARLAX) 17  GM/SCOOP powder 1 scoop in 8 ounces water  once daily as needed for constipation 06/03/23   Luking, Glendia LABOR, MD  Safety Lancets 28G MISC CHECK BLOOD SUGAR ONCE DAILY. 01/21/24   [name], MD  senna-docusate (SENOKOT-S) 8.6-50 MG tablet Take 1 tablet by mouth at bedtime. 04/02/24   Danford, Lonni SQUIBB, MD  sodium chloride  1 g tablet TAKE (1) TABLET BY MOUTH TWICE DAILY WITH A MEAL. 01/26/24   [name], MD  tamsulosin  (FLOMAX ) 0.4 MG CAPS capsule Take 1 capsule (0.4 mg total) by mouth daily. 03/08/24   Matilda Senior, MD  valsartan  (DIOVAN ) 320 MG tablet Take 1 tablet (320 mg total) by mouth daily. 12/22/23   [name], MD  Vitamin D , Cholecalciferol , 10 MCG (400 UNIT) TABS TAKE (1) TABLET BY MOUTH ONCE DAILY. 10/07/23   [name], MD     Family History  Problem Relation Age of Onset   Cancer Mother        ovaian   Hypertension Father        kidney   Stroke Father    Cancer Father    Hypertension Sister    Diabetes Brother     Social History   Socioeconomic History   Marital status: Widowed    Spouse name: Not on file   Number of children: Not on file   Years of education: Not on file   Highest education level: Not on file  Occupational History   Not on file  Tobacco Use   Smoking status: Never   Smokeless tobacco: Never  Vaping Use   Vaping status: Never Used  Substance and Sexual Activity   Alcohol  use: No   Drug use: No   Sexual activity: Never    Birth control/protection: Abstinence  Other Topics Concern   Not on file  Social History Narrative   Not on file   Social Drivers of Health   Financial Resource Strain: Low Risk  (09/06/2022)   Overall Financial Resource Strain (CARDIA)    Difficulty of Paying Living Expenses: Not hard at all  Food Insecurity: No Food Insecurity (03/26/2024)   Hunger Vital Sign    Worried About Running Out of Food in the Last Year: Never true    Ran Out of Food in the Last Year: Never true  Transportation Needs: No  Transportation Needs (03/26/2024)   PRAPARE - Administrator, Civil Service (Medical): No    Lack of Transportation (Non-Medical): No  Physical Activity: Sufficiently Active (  09/06/2022)   Exercise Vital Sign    Days of Exercise per Week: 5 days    Minutes of Exercise per Session: 30 min  Stress: No Stress Concern Present (09/06/2022)   [employer] of Occupational Health - Occupational Stress Questionnaire    Feeling of Stress : Not at all  Social Connections: Moderately Isolated (03/26/2024)   Social Connection and Isolation Panel    Frequency of Communication with Friends and Family: More than three times a week    Frequency of Social Gatherings with Friends and Family: More than three times a week    Attends Religious Services: 1 to 4 times per year    Active Member of [employer] or Organizations: No    Attends Banker Meetings: Never    Marital Status: Widowed     Vital Signs: There were no vitals taken for this visit.  Physical Exam Constitutional:      General: She is not in acute distress. HENT:     Head: Normocephalic.     Mouth/Throat:     Mouth: Mucous membranes are moist.  Eyes:     General: No scleral icterus. Cardiovascular:     Rate and Rhythm: Normal rate and regular rhythm.  Pulmonary:     Effort: No respiratory distress.  Abdominal:     General: There is no distension.     Tenderness: There is abdominal tenderness.  Musculoskeletal:     Right lower leg: No edema.     Left lower leg: No edema.  Skin:    General: Skin is warm and dry.  Neurological:     Mental Status: She is alert and oriented to person, place, and time.     Imaging: CTA [facility] 03/26/24  IMPRESSION: VASCULAR  1. Stent within the origin of the superior mesenteric artery, which appears widely patent. 2. 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 3. Stable high-grade stenosis of the bilateral main renal arteries, estimated  70-90% on the right and 50-70% on the left. Two accessory left renal artery supplying the lower pole are widely patent. 4. Chronic right common femoral artery pseudoaneurysm, which has decreased in overall size and developed increased mural thrombus since prior exams. A thin patent neck and minimal central enhancement are identified. If further evaluation is clinically indicated, ultrasound could be performed. 5. Aortic Atherosclerosis (ICD10-I70.0).   Labs:  CBC: Recent Labs    03/29/24 0250 03/30/24 0216 03/31/24 0521 04/01/24 0246  WBC 6.5 6.6 5.9 6.5  HGB 10.1* 10.0* 10.8* 10.3*  HCT 32.1* 33.1* 34.0* 32.5*  PLT 255 271 273 251    COAGS: Recent Labs    04/11/23 0850 03/28/24 1434  INR 1.1 1.0    BMP: Recent Labs    03/29/24 0250 03/30/24 0216 03/31/24 0521 04/01/24 0246  NA 134* 136 137 135  K 4.3 4.5 4.3 4.2  CL 98 97* 101 101  CO2 26 26 24 25   GLUCOSE 128* 119* 135* 114*  BUN 23 19 20 18   CALCIUM 8.8* 8.9 9.1 8.9  CREATININE 0.95 1.07* 0.96 0.84  GFRNONAA 57* 49* 56* >60    LIVER FUNCTION TESTS: Recent Labs    03/29/24 0250 03/30/24 0216 03/31/24 0521 04/01/24 0246  BILITOT 0.7 0.8 0.6 0.9  AST 15 21 16 15   ALT 12 11 12 13   ALKPHOS 68 71 73 64  PROT 5.7* 5.9* 6.1* 5.9*  ALBUMIN 3.0* 3.2* 3.2* 3.2*    Assessment and Plan: 89 year old female  with a history of chronic mesenteric ischemia status post SMA stent placement 04/11/2023. She was recently hospitalized for abdominal pain which has gotten significantly worse since discharge.  The pain is worse post-prandial, and she is unable to tolerate PO intake currently.  She is very distraught and in severe pain, stating she'd rather die that feel like this.  I believe that it is reasonable to consider celiac artery stenting due to the severity of her pain and absence of other etiologies.  She wants to go back to the hospital for pain control given her worsened status.  Therefore I recommended she  present to [facility] where I could possibly place the celiac stent on Thursday when I am there next.  We discussed that recanalization of her celiac artery may not improve her pain.  IR will be available for consultation once she presents to the hospital. Please feel free to reach out with any questions.  I spoke with her son, [name], via telephone at the patient's request to update him on her scenario.   Ester Sides, MD Pager: [phone]    I spent a total of 25 Minutes in face to face in clinical consultation, greater than 50% of which was counseling/coordinating care for abdominal pain.

## 2024-04-12 ENCOUNTER — Other Ambulatory Visit: Payer: Self-pay

## 2024-04-12 ENCOUNTER — Ambulatory Visit
Admission: RE | Admit: 2024-04-12 | Discharge: 2024-04-12 | Disposition: A | Source: Ambulatory Visit | Attending: Interventional Radiology

## 2024-04-12 ENCOUNTER — Inpatient Hospital Stay (HOSPITAL_COMMUNITY)
Admission: EM | Admit: 2024-04-12 | Discharge: 2024-04-22 | DRG: 357 | Disposition: A | Discharge status: Skilled Nursing Facility | Attending: Internal Medicine | Admitting: Internal Medicine

## 2024-04-12 DIAGNOSIS — G8929 Other chronic pain: Secondary | ICD-10-CM | POA: Diagnosis present

## 2024-04-12 DIAGNOSIS — Z7984 Long term (current) use of oral hypoglycemic drugs: Secondary | ICD-10-CM

## 2024-04-12 DIAGNOSIS — I10 Essential (primary) hypertension: Secondary | ICD-10-CM | POA: Diagnosis present

## 2024-04-12 DIAGNOSIS — F411 Generalized anxiety disorder: Secondary | ICD-10-CM | POA: Diagnosis not present

## 2024-04-12 DIAGNOSIS — K55059 Acute (reversible) ischemia of intestine, part and extent unspecified: Principal | ICD-10-CM | POA: Diagnosis present

## 2024-04-12 DIAGNOSIS — R109 Unspecified abdominal pain: Secondary | ICD-10-CM | POA: Diagnosis not present

## 2024-04-12 DIAGNOSIS — R1031 Right lower quadrant pain: Secondary | ICD-10-CM | POA: Diagnosis not present

## 2024-04-12 DIAGNOSIS — E785 Hyperlipidemia, unspecified: Secondary | ICD-10-CM | POA: Diagnosis present

## 2024-04-12 DIAGNOSIS — R1310 Dysphagia, unspecified: Secondary | ICD-10-CM | POA: Diagnosis not present

## 2024-04-12 DIAGNOSIS — K551 Chronic vascular disorders of intestine: Secondary | ICD-10-CM

## 2024-04-12 DIAGNOSIS — K559 Vascular disorder of intestine, unspecified: Secondary | ICD-10-CM | POA: Diagnosis present

## 2024-04-12 DIAGNOSIS — I743 Embolism and thrombosis of arteries of the lower extremities: Secondary | ICD-10-CM | POA: Diagnosis not present

## 2024-04-12 DIAGNOSIS — Z66 Do not resuscitate: Secondary | ICD-10-CM | POA: Diagnosis present

## 2024-04-12 DIAGNOSIS — I724 Aneurysm of artery of lower extremity: Secondary | ICD-10-CM | POA: Diagnosis not present

## 2024-04-12 DIAGNOSIS — Z79899 Other long term (current) drug therapy: Secondary | ICD-10-CM

## 2024-04-12 DIAGNOSIS — T81718A Complication of other artery following a procedure, not elsewhere classified, initial encounter: Secondary | ICD-10-CM | POA: Diagnosis not present

## 2024-04-12 DIAGNOSIS — Z833 Family history of diabetes mellitus: Secondary | ICD-10-CM | POA: Diagnosis not present

## 2024-04-12 DIAGNOSIS — M6281 Muscle weakness (generalized): Secondary | ICD-10-CM | POA: Diagnosis not present

## 2024-04-12 DIAGNOSIS — Z96651 Presence of right artificial knee joint: Secondary | ICD-10-CM | POA: Diagnosis present

## 2024-04-12 DIAGNOSIS — K5792 Diverticulitis of intestine, part unspecified, without perforation or abscess without bleeding: Secondary | ICD-10-CM | POA: Diagnosis not present

## 2024-04-12 DIAGNOSIS — K8020 Calculus of gallbladder without cholecystitis without obstruction: Secondary | ICD-10-CM | POA: Diagnosis not present

## 2024-04-12 DIAGNOSIS — K5900 Constipation, unspecified: Secondary | ICD-10-CM | POA: Diagnosis present

## 2024-04-12 DIAGNOSIS — R41841 Cognitive communication deficit: Secondary | ICD-10-CM | POA: Diagnosis not present

## 2024-04-12 DIAGNOSIS — Z7902 Long term (current) use of antithrombotics/antiplatelets: Secondary | ICD-10-CM | POA: Diagnosis not present

## 2024-04-12 DIAGNOSIS — F0390 Unspecified dementia without behavioral disturbance: Secondary | ICD-10-CM | POA: Diagnosis not present

## 2024-04-12 DIAGNOSIS — Z794 Long term (current) use of insulin: Secondary | ICD-10-CM

## 2024-04-12 DIAGNOSIS — I771 Stricture of artery: Secondary | ICD-10-CM | POA: Diagnosis present

## 2024-04-12 DIAGNOSIS — Z8249 Family history of ischemic heart disease and other diseases of the circulatory system: Secondary | ICD-10-CM

## 2024-04-12 DIAGNOSIS — Z7982 Long term (current) use of aspirin: Secondary | ICD-10-CM | POA: Diagnosis not present

## 2024-04-12 DIAGNOSIS — Z95828 Presence of other vascular implants and grafts: Secondary | ICD-10-CM | POA: Diagnosis not present

## 2024-04-12 DIAGNOSIS — E1142 Type 2 diabetes mellitus with diabetic polyneuropathy: Secondary | ICD-10-CM | POA: Diagnosis present

## 2024-04-12 DIAGNOSIS — R262 Difficulty in walking, not elsewhere classified: Secondary | ICD-10-CM | POA: Diagnosis not present

## 2024-04-12 DIAGNOSIS — E1159 Type 2 diabetes mellitus with other circulatory complications: Secondary | ICD-10-CM | POA: Diagnosis not present

## 2024-04-12 DIAGNOSIS — K219 Gastro-esophageal reflux disease without esophagitis: Secondary | ICD-10-CM | POA: Diagnosis not present

## 2024-04-12 DIAGNOSIS — I152 Hypertension secondary to endocrine disorders: Secondary | ICD-10-CM | POA: Diagnosis not present

## 2024-04-12 HISTORY — PX: IR RADIOLOGIST EVAL & MGMT: IMG5224

## 2024-04-12 LAB — CBC
HCT: 35.3 % — ABNORMAL LOW (ref 36.0–46.0)
Hemoglobin: 11.4 g/dL — ABNORMAL LOW (ref 12.0–15.0)
MCH: 27.4 pg (ref 26.0–34.0)
MCHC: 32.3 g/dL (ref 30.0–36.0)
MCV: 84.9 fL (ref 80.0–100.0)
Platelets: 322 K/uL (ref 150–400)
RBC: 4.16 MIL/uL (ref 3.87–5.11)
RDW: 14.6 % (ref 11.5–15.5)
WBC: 7.1 K/uL (ref 4.0–10.5)
nRBC: 0 % (ref 0.0–0.2)

## 2024-04-12 LAB — COMPREHENSIVE METABOLIC PANEL WITH GFR
ALT: 11 U/L (ref 0–44)
AST: 17 U/L (ref 15–41)
Albumin: 3.8 g/dL (ref 3.5–5.0)
Alkaline Phosphatase: 72 U/L (ref 38–126)
Anion gap: 13 (ref 5–15)
BUN: 15 mg/dL (ref 8–23)
CO2: 23 mmol/L (ref 22–32)
Calcium: 9.4 mg/dL (ref 8.9–10.3)
Chloride: 101 mmol/L (ref 98–111)
Creatinine, Ser: 1.02 mg/dL — ABNORMAL HIGH (ref 0.44–1.00)
GFR, Estimated: 52 mL/min — ABNORMAL LOW (ref >60)
Glucose, Bld: 133 mg/dL — ABNORMAL HIGH (ref 70–99)
Potassium: 3.8 mmol/L (ref 3.5–5.1)
Sodium: 137 mmol/L (ref 135–145)
Total Bilirubin: 1.1 mg/dL (ref 0.0–1.2)
Total Protein: 6.6 g/dL (ref 6.5–8.1)

## 2024-04-12 LAB — GLUCOSE, CAPILLARY
Glucose-Capillary: 117 mg/dL — ABNORMAL HIGH (ref 70–99)
Glucose-Capillary: 118 mg/dL — ABNORMAL HIGH (ref 70–99)
Glucose-Capillary: 133 mg/dL — ABNORMAL HIGH (ref 70–99)

## 2024-04-12 LAB — LIPASE, BLOOD: Lipase: 22 U/L (ref 11–51)

## 2024-04-12 MED ORDER — TAMSULOSIN HCL 0.4 MG PO CAPS
0.4000 mg | ORAL_CAPSULE | Freq: Every day | ORAL | Status: DC
  Administered 2024-04-12 – 2024-04-22 (×11): 0.4 mg via ORAL
  Filled 2024-04-12 (×11): qty 1

## 2024-04-12 MED ORDER — ALPRAZOLAM 0.25 MG PO TABS
0.2500 mg | ORAL_TABLET | Freq: Every day | ORAL | Status: DC
  Administered 2024-04-12 – 2024-04-21 (×9): 0.25 mg via ORAL
  Filled 2024-04-12 (×10): qty 1

## 2024-04-12 MED ORDER — ACETAMINOPHEN 325 MG PO TABS
650.0000 mg | ORAL_TABLET | Freq: Four times a day (QID) | ORAL | Status: DC | PRN
Start: 2024-04-12 — End: 2024-04-22
  Administered 2024-04-14 – 2024-04-16 (×3): 650 mg via ORAL
  Filled 2024-04-12 (×3): qty 2

## 2024-04-12 MED ORDER — ALUM & MAG HYDROXIDE-SIMETH 200-200-20 MG/5ML PO SUSP
30.0000 mL | Freq: Four times a day (QID) | ORAL | Status: DC
  Administered 2024-04-12: 30 mL via ORAL
  Filled 2024-04-12: qty 30

## 2024-04-12 MED ORDER — PANTOPRAZOLE SODIUM 40 MG PO TBEC
40.0000 mg | DELAYED_RELEASE_TABLET | Freq: Two times a day (BID) | ORAL | Status: DC
  Administered 2024-04-12 – 2024-04-22 (×19): 40 mg via ORAL
  Filled 2024-04-12 (×19): qty 1

## 2024-04-12 MED ORDER — MORPHINE SULFATE (PF) 2 MG/ML IV SOLN
2.0000 mg | INTRAVENOUS | Status: DC | PRN
  Administered 2024-04-13 – 2024-04-16 (×5): 2 mg via INTRAVENOUS
  Administered 2024-04-16: 1 mg via INTRAVENOUS
  Filled 2024-04-12 (×9): qty 1

## 2024-04-12 MED ORDER — ALBUTEROL SULFATE (2.5 MG/3ML) 0.083% IN NEBU
2.5000 mg | INHALATION_SOLUTION | Freq: Four times a day (QID) | RESPIRATORY_TRACT | Status: DC | PRN
Start: 2024-04-12 — End: 2024-04-22

## 2024-04-12 MED ORDER — HYDRALAZINE HCL 25 MG PO TABS
25.0000 mg | ORAL_TABLET | Freq: Four times a day (QID) | ORAL | Status: DC | PRN
Start: 2024-04-12 — End: 2024-04-22
  Administered 2024-04-15: 25 mg via ORAL
  Filled 2024-04-12 (×2): qty 1

## 2024-04-12 MED ORDER — ENOXAPARIN SODIUM 40 MG/0.4ML IJ SOSY
40.0000 mg | PREFILLED_SYRINGE | INTRAMUSCULAR | Status: AC
Start: 2024-04-12 — End: ?
  Administered 2024-04-12 – 2024-04-15 (×4): 40 mg via SUBCUTANEOUS
  Filled 2024-04-12 (×4): qty 0.4

## 2024-04-12 MED ORDER — AMLODIPINE BESYLATE 10 MG PO TABS
10.0000 mg | ORAL_TABLET | Freq: Every day | ORAL | Status: DC
  Administered 2024-04-12 – 2024-04-13 (×2): 10 mg via ORAL
  Filled 2024-04-12 (×3): qty 1

## 2024-04-12 MED ORDER — ALUM & MAG HYDROXIDE-SIMETH 200-200-20 MG/5ML PO SUSP
30.0000 mL | Freq: Four times a day (QID) | ORAL | Status: AC
  Administered 2024-04-12 – 2024-04-13 (×2): 30 mL via ORAL
  Filled 2024-04-12 (×4): qty 30

## 2024-04-12 MED ORDER — ONDANSETRON HCL 4 MG/2ML IJ SOLN: 4.0000 mg | Freq: Four times a day (QID) | INTRAMUSCULAR | Status: DC | PRN

## 2024-04-12 MED ORDER — GABAPENTIN 100 MG PO CAPS
100.0000 mg | ORAL_CAPSULE | Freq: Every day | ORAL | Status: DC
Start: 2024-04-13 — End: 2024-04-22
  Administered 2024-04-13 – 2024-04-22 (×10): 100 mg via ORAL
  Filled 2024-04-12 (×10): qty 1

## 2024-04-12 MED ORDER — DEXTROSE IN LACTATED RINGERS 5 % IV SOLN: INTRAVENOUS | Status: DC

## 2024-04-12 MED ORDER — ASPIRIN 81 MG PO CHEW
81.0000 mg | CHEWABLE_TABLET | Freq: Every day | ORAL | Status: DC
  Administered 2024-04-12 – 2024-04-22 (×11): 81 mg via ORAL
  Filled 2024-04-12 (×11): qty 1

## 2024-04-12 MED ORDER — MORPHINE SULFATE (PF) 2 MG/ML IV SOLN
2.0000 mg | Freq: Once | INTRAVENOUS | Status: AC
  Administered 2024-04-12: 2 mg via INTRAVENOUS
  Filled 2024-04-12: qty 1

## 2024-04-12 MED ORDER — INSULIN ASPART 100 UNIT/ML IJ SOLN
0.0000 [IU] | Freq: Three times a day (TID) | INTRAMUSCULAR | Status: DC
  Administered 2024-04-13 (×3): 1 [IU] via SUBCUTANEOUS
  Administered 2024-04-14: 2 [IU] via SUBCUTANEOUS
  Administered 2024-04-15 – 2024-04-18 (×4): 1 [IU] via SUBCUTANEOUS
  Administered 2024-04-18 (×2): 2 [IU] via SUBCUTANEOUS
  Administered 2024-04-19: 3 [IU] via SUBCUTANEOUS
  Administered 2024-04-19 – 2024-04-21 (×7): 2 [IU] via SUBCUTANEOUS
  Administered 2024-04-21: 1 [IU] via SUBCUTANEOUS
  Administered 2024-04-22: 2 [IU] via SUBCUTANEOUS
  Administered 2024-04-22: 3 [IU] via SUBCUTANEOUS

## 2024-04-12 MED ORDER — ONDANSETRON HCL 4 MG PO TABS: 4.0000 mg | ORAL_TABLET | Freq: Four times a day (QID) | ORAL | Status: DC | PRN

## 2024-04-12 NOTE — Consult Note (Signed)
 Chief Complaint: Patient was seen in consultation today for abdominal pain  Referring Physician(s): Dr. Cecily  Supervising Physician: Philip Cornet  Patient Status: [facility] - In-pt  History of Present Illness: [name] is a 89 y.o. 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  She was recently admitted for acute-on-chronic abdominal pain and acute UTI.  Consideration was made for possible celiac artery stenting during her most recent admission, however her abdominal pain was felt to be multifactoral and urgent stent placement was deferred.  She has since been discharged and was seen in consultation today with Dr. Jennefer for follow-up of her abdominal pain.  After discussion, she was expected to undergo celiac artery stenting with Dr. Jennefer.  She has presented to ED today due to poorly controlled abdominal pain.   Discussed with Dr. Jennefer who recommends proceeding with celiac artery stent at earliest availability-- Thursday of this week. WIll also plan to address her known right femoral pseudoaneurysm.   Discussed with patient who is alert, oriented, complaining of abdominal pain, and very agreeable to this plan.  Past Medical History:  Diagnosis Date   Blood transfusion without reported diagnosis    Cognitive dysfunction 03/04/2019   Patient scores 21 out of 30 on [place] cognitive assessment September 2020   Diabetes mellitus without complication (HCC)    Diabetic peripheral neuropathy associated with type 2 diabetes mellitus (HCC) 02/25/2022   Diverticulitis    Frailty 03/04/2019   H/O bilateral breast reduction surgery    Hyperlipidemia    a. intolerant to statins.    Hypertension     Past Surgical History:  Procedure Laterality Date   ABDOMINAL HYSTERECTOMY  2003   APPENDECTOMY     age 56   BIOPSY  04/10/2023   Procedure: BIOPSY;  Surgeon: Cinderella Deatrice FALCON, MD;  Location: [facility];   Service: Endoscopy;;   BREAST REDUCTION SURGERY     age 78   COLON SURGERY Left 2011   Hemicolectomy due to diverticulitis   ESOPHAGOGASTRODUODENOSCOPY (EGD) WITH PROPOFOL  N/A 04/10/2023   Procedure: ESOPHAGOGASTRODUODENOSCOPY (EGD) WITH PROPOFOL ;  Surgeon: Cinderella Deatrice FALCON, MD;  Location: [facility];  Service: Endoscopy;  Laterality: N/A;   EYE SURGERY  03/30/2009   cataract   IR ANGIOGRAM VISCERAL SELECTIVE  04/11/2023   IR INTRAVASCULAR ULTRASOUND NON CORONARY  04/11/2023   IR RADIOLOGIST EVAL & MGMT  04/12/2024   IR TRANSCATH PLC STENT 1ST ART NOT LE CV CAR VERT CAR  04/11/2023   IR US  GUIDE VASC ACCESS RIGHT  04/11/2023   KNEE SURGERY Right 2003   knee cap   LAPAROSCOPIC INCISIONAL / UMBILICAL / VENTRAL HERNIA REPAIR  02/23/2007   REDUCTION MAMMAPLASTY Bilateral 2001   REFRACTIVE SURGERY     TOTAL KNEE ARTHROPLASTY Right 04/10/2022   Procedure: TOTAL KNEE ARTHROPLASTY;  Surgeon: Gerome Charleston, MD;  Location: [facility];  Service: Orthopedics;  Laterality: Right;  adductor canal    Allergies: Statins, Phenergan [promethazine hcl], Hydrocortisone, Lisinopril, Prednisone , and Trazodone  and nefazodone  Medications: Prior to Admission medications   Medication Sig Start Date End Date Taking? Authorizing Provider  acetaminophen  (TYLENOL ) 325 MG tablet TAKE (2) TABLETS BY MOUTH EVERY SIX HOURS AS NEEDED FOR PAIN. MAX 3GM IN 24 HOURS. 07/28/23   [name], MD  albuterol  (VENTOLIN  HFA) 108 (90 Base) MCG/ACT inhaler INHALE 2 PUFFS INTO THE LUNGS EVERY SIX HOURS AS NEEDED FOR WHEEZING OR SHORTNESS OF  BREATH. 09/26/23   [name], MD  ALPRAZolam  (XANAX ) 0.25 MG tablet Take 1 tablet (0.25 mg total) by mouth at bedtime. 04/02/24   Danford, Lonni SQUIBB, MD  amLODipine  (NORVASC ) 10 MG tablet TAKE (1) TABLET BY MOUTH ONCE DAILY. 10/07/23   [name], MD  aspirin  (ASPIRIN  LOW DOSE) 81 MG chewable tablet CHEW (1) TABLET BY MOUTH ONCE DAILY. 09/06/23   [name], MD  BETA  CAROTENE PROVITAMIN A 25000 units capsule TAKE (1) CAPSULE BY MOUTH AT BEDTIME. 02/24/24   Luking, Glendia LABOR, MD  BISACODYL  5 MG EC tablet TAKE 1 TABLET BY MOUTH ONCE DAILY AS NEEDED FOR MODERATE CONSTIPATION. 12/20/22   [name], MD  cyanocobalamin  1000 MCG tablet Take 1,000 mcg by mouth at bedtime.    [provider]  estradiol  (ESTRACE ) 0.1 MG/GM vaginal cream Discard plastic applicator. Insert a blueberry size amount (approximately 1 gram) of cream on fingertip inside vagina at bedtime every night for 1 week then every other night. For long term use. 01/15/24   Gerldine Lauraine BROCKS, FNP  gabapentin  (NEURONTIN ) 100 MG capsule 1 qam for neuropath- new direction 04/05/24   Luking, Glendia LABOR, MD  glucose blood (EASYMAX TEST) test strip CHECK BLOOD SUGAR ONCE DAILY.(CALL MD IF BS BELOW 60: OR IF BS ABOVE 400) 10/07/23   Luking, Glendia LABOR, MD  hydrALAZINE  (APRESOLINE ) 25 MG tablet Take 1 tablet (25 mg total) by mouth 3 (three) times daily. 12/31/23   Luking, Glendia LABOR, MD  IVIZIA DRY EYES 0.5 % SOLN Place 1 drop into both eyes 2 (two) times daily. 09/22/23   [provider]  ketoconazole  (NIZORAL ) 2 % cream Apply 1 Application topically 2 (two) times daily. For 10 days to groin rash 03/03/24   [name], MD  Melatonin 10 MG TABS Take 10 mg by mouth at bedtime. 03/24/24   Grooms, Courtney, PA-C  METAMUCIL 4 IN 1 FIBER 25 % PACK Take by mouth 2 (two) times daily. 03/25/24   [provider]  metFORMIN  (GLUCOPHAGE ) 500 MG tablet TAKE (1) TABLET BY MOUTH IN THE MORNING & (1/2) TABLET (250MG ) BY MOUTH AT SUPPER 10/07/23   Luking, Glendia LABOR, MD  methenamine  (HIPREX ) 1 g tablet Take 1 tablet (1 g total) by mouth 2 (two) times daily with a meal. Most effective when taken with a daily Vitamin C supplement. 01/15/24   Gerldine Lauraine BROCKS, FNP  Multiple Vitamins-Minerals (PRESERVISION AREDS 2) CAPS Take 1 capsule by mouth in the morning and at bedtime.    [provider]  mupirocin  ointment  (BACTROBAN ) 2 % Apply thin amount twice daily to rash in groin creases twice daily for 5 days at a time when rash occurs 03/07/24   [name], MD  ondansetron  (ZOFRAN -ODT) 4 MG disintegrating tablet 4mg  ODT q4 hours prn nausea/vomit 09/05/23   Zammit, Joseph, MD  oxyCODONE -acetaminophen  (PERCOCET/ROXICET) 5-325 MG tablet Take 1 tablet by mouth every 4 (four) hours as needed for moderate pain (pain score 4-6) or severe pain (pain score 7-10). 04/02/24   Danford, Lonni SQUIBB, MD  pantoprazole  (PROTONIX ) 40 MG tablet Take 1 tablet (40 mg total) by mouth 2 (two) times daily before a meal. 12/24/23   Cook, Jayce G, DO  polyethylene glycol powder (GOODSENSE CLEARLAX) 17 GM/SCOOP powder 1 scoop in 8 ounces water  once daily as needed for constipation 06/03/23   Luking, Glendia LABOR, MD  Safety Lancets 28G MISC CHECK BLOOD SUGAR ONCE DAILY. 01/21/24   Luking,  Scott A, MD  senna-docusate (SENOKOT-S) 8.6-50 MG tablet Take 1 tablet by mouth at bedtime. 04/02/24   Danford, Lonni SQUIBB, MD  sodium chloride  1 g tablet TAKE (1) TABLET BY MOUTH TWICE DAILY WITH A MEAL. 01/26/24   [name], MD  tamsulosin  (FLOMAX ) 0.4 MG CAPS capsule Take 1 capsule (0.4 mg total) by mouth daily. 03/08/24   Matilda Senior, MD  valsartan  (DIOVAN ) 320 MG tablet Take 1 tablet (320 mg total) by mouth daily. 12/22/23   [name], MD  Vitamin D , Cholecalciferol , 10 MCG (400 UNIT) TABS TAKE (1) TABLET BY MOUTH ONCE DAILY. 10/07/23   [name], MD     Family History  Problem Relation Age of Onset   Cancer Mother        ovaian   Hypertension Father        kidney   Stroke Father    Cancer Father    Hypertension Sister    Diabetes Brother     Social History   Socioeconomic History   Marital status: Widowed    Spouse name: Not on file   Number of children: Not on file   Years of education: Not on file   Highest education level: Not on file  Occupational History   Not on file  Tobacco Use   Smoking status: Never    Smokeless tobacco: Never  Vaping Use   Vaping status: Never Used  Substance and Sexual Activity   Alcohol  use: No   Drug use: No   Sexual activity: Never    Birth control/protection: Abstinence  Other Topics Concern   Not on file  Social History Narrative   Not on file   Social Drivers of Health   Financial Resource Strain: Low Risk  (09/06/2022)   Overall Financial Resource Strain (CARDIA)    Difficulty of Paying Living Expenses: Not hard at all  Food Insecurity: No Food Insecurity (03/26/2024)   Hunger Vital Sign    Worried About Running Out of Food in the Last Year: Never true    Ran Out of Food in the Last Year: Never true  Transportation Needs: No Transportation Needs (03/26/2024)   PRAPARE - Administrator, Civil Service (Medical): No    Lack of Transportation (Non-Medical): No  Physical Activity: Sufficiently Active (09/06/2022)   Exercise Vital Sign    Days of Exercise per Week: 5 days    Minutes of Exercise per Session: 30 min  Stress: No Stress Concern Present (09/06/2022)   [employer] of Occupational Health - Occupational Stress Questionnaire    Feeling of Stress : Not at all  Social Connections: Moderately Isolated (03/26/2024)   Social Connection and Isolation Panel    Frequency of Communication with Friends and Family: More than three times a week    Frequency of Social Gatherings with Friends and Family: More than three times a week    Attends Religious Services: 1 to 4 times per year    Active Member of [employer] or Organizations: No    Attends Banker Meetings: Never    Marital Status: Widowed     Review of Systems: A 12 point ROS discussed and pertinent positives are indicated in the HPI above.  All other systems are negative.  Review of Systems  Constitutional:  Negative for fatigue and fever.  Respiratory:  Negative for cough and shortness of breath.   Cardiovascular:  Negative for chest pain.  Gastrointestinal:  Positive for  abdominal pain (generalized).  Musculoskeletal:  Negative for back pain.  Psychiatric/Behavioral:  Negative for behavioral problems and confusion.     Vital Signs: BP (!) 146/54 (BP Location: Left Arm)   Pulse 66   Temp 97.8 F (36.6 C) (Oral)   Resp 19   Ht 5' 8 (1.727 m)   Wt 166 lb (75.3 kg)   SpO2 96%   BMI 25.24 kg/m   Physical Exam Vitals and nursing note reviewed.  Constitutional:      General: She is not in acute distress.    Appearance: She is well-developed. She is not ill-appearing.  Cardiovascular:     Rate and Rhythm: Normal rate.  Pulmonary:     Effort: Pulmonary effort is normal.     Breath sounds: Normal breath sounds.  Abdominal:     General: Abdomen is flat.  Skin:    General: Skin is warm and dry.  Neurological:     General: No focal deficit present.     Mental Status: She is alert and oriented to person, place, and time.      MD Evaluation Airway: WNL Heart: WNL Abdomen: WNL Chest/ Lungs: WNL ASA  Classification: 3 Mallampati/Airway Score: Two   Imaging: IR Radiologist Eval & Mgmt Result Date: 04/12/2024 EXAM: ESTABLISHED PATIENT OFFICE VISIT CHIEF COMPLAINT: See Epic note. HISTORY OF PRESENT ILLNESS: See Epic note. REVIEW OF SYSTEMS: See Epic note. PHYSICAL EXAMINATION: See Epic note. ASSESSMENT AND PLAN: See Epic note. Ester Sides, MD [facility] Electronically Signed   By: Ester Sides M.D.   On: 04/12/2024 10:18   CT ABDOMEN PELVIS WO CONTRAST Result Date: 03/28/2024 EXAM: CT ABDOMEN AND PELVIS WITHOUT CONTRAST 03/28/2024 10:10:16 AM TECHNIQUE: CT of the abdomen and pelvis was performed without the administration of intravenous contrast. Multiplanar reformatted images are provided for review. Automated exposure control, iterative reconstruction, and/or weight-based adjustment of the mA/kV was utilized to reduce the radiation dose to as low as reasonably achievable. COMPARISON: CT  angio abdomen and pelvis with contrast 03/26/2024. CLINICAL HISTORY: Abdominal pain, acute, nonlocalized; worsening abdominal pain. FINDINGS: LOWER CHEST: No acute abnormality. LIVER: A small cyst or hemangioma near the dome of the liver is stable. GALLBLADDER AND BILE DUCTS: Multiple gallstones are again noted at the neck of the gallbladder. No inflammatory changes or associated. No biliary ductal dilatation. SPLEEN: No acute abnormality. PANCREAS: No acute abnormality. ADRENAL GLANDS: No acute abnormality. KIDNEYS, URETERS AND BLADDER: No stones in the kidneys or ureters. No hydronephrosis. No perinephric or periureteral stranding. Mild bladder wall thickening is less prominent than on the previous study. GI AND BOWEL: Stomach demonstrates no acute abnormality. Diverticular changes are again noted throughout the descending and sigmoid colon without focal inflammation to suggest diverticulitis. There is no bowel obstruction. PERITONEUM AND RETROPERITONEUM: No ascites. No free air. VASCULATURE: Extensive atherosclerotic changes are again noted in the aorta. Stent at the origin of the SMA is noted. LYMPH NODES: No lymphadenopathy. REPRODUCTIVE ORGANS: No acute abnormality. BONES AND SOFT TISSUES: Ventral hernia repair. No acute osseous abnormality. No focal soft tissue abnormality. IMPRESSION: 1. No acute findings. 2. Multiple gallstones at the neck of the gallbladder without inflammatory changes. 3. Diverticular changes in the descending and sigmoid colon without evidence of diverticulitis. Electronically signed by: Lonni Necessary MD 03/28/2024 10:47 AM EDT RP Workstation: [id number]   CT Angio Abd/Pel W and/or Wo Contrast Result Date: 03/26/2024 CLINICAL DATA:  Epigastric pain radiating to lower abdomen for 3-4 days, progressive worsening, nausea, history of mesenteric artery  stenosis EXAM: CTA ABDOMEN AND PELVIS WITHOUT AND WITH CONTRAST TECHNIQUE: Multidetector CT imaging of the abdomen and pelvis was  performed using the standard protocol during bolus administration of intravenous contrast. Multiplanar reconstructed images and MIPs were obtained and reviewed to evaluate the vascular anatomy. RADIATION DOSE REDUCTION: This exam was performed according to the departmental dose-optimization program which includes automated exposure control, adjustment of the mA and/or kV according to patient size and/or use of iterative reconstruction technique. CONTRAST:  80mL OMNIPAQUE  IOHEXOL  350 MG/ML SOLN COMPARISON:  03/09/2024, 02/24/2024 FINDINGS: VASCULAR Aorta: Normal caliber aorta without aneurysm, dissection, vasculitis or significant stenosis. Diffuse atherosclerosis unchanged. 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 Distal branches of the celiac are patent, with no evidence of aneurysm, dissection, or vasculitis. SMA: There is a stent at the origin of the SMA, which appears widely patent. Distal branches of the SMA are normal, with no evidence of aneurysm, dissection, or vasculitis. Renals: There is significant stenosis at the origin of the bilateral main renal arteries, estimated 70-90% stenosis on the right and 50-70% stenosis on the left. There are 2 additional accessory left renal arteries arising from the aorta, supplying the lower pole left kidney, which appear widely patent throughout their courses. No evidence of aneurysm, dissection, vasculitis, or fibromuscular dysplasia. IMA: The IMA is not severe stenosis at the origin of the IMA, though the distal branches are normal caliber and widely patent, likely via collateral flow. No evidence of aneurysm, dissection, or vasculitis. Inflow: Patent without evidence of aneurysm, dissection, vasculitis or significant stenosis. Stable diffuse atherosclerosis. Proximal Outflow: Pseudoaneurysm of the right common femoral artery is again noted, measuring approximately 2.3 x 2.2 cm, previously measuring 2.2 x 3.0 cm on the  09/22/2023 exam. Significant mural thrombus has developed within the pseudoaneurysm since the prior exams, with a thin patent neck and minimal central flow identified on this study. If further evaluation is clinically indicated, ultrasound could be performed. The bilateral common femoral, superficial femoral, and profundus femoral arteries are patent. No dissection, vasculitis, or critical stenosis. Diffuse atherosclerosis unchanged. Veins: No obvious venous abnormality within the limitations of this arterial phase study. Review of the MIP images confirms the above findings. NON-VASCULAR Lower chest: No acute pleural or parenchymal lung disease. Hepatobiliary: Calcified gallstones without cholecystitis. Unremarkable appearance of the liver. No biliary duct dilation or choledocholithiasis. Pancreas: Unremarkable. No pancreatic ductal dilatation or surrounding inflammatory changes. Spleen: Normal in size without focal abnormality. Adrenals/Urinary Tract: Mild diffuse bladder wall thickening and perivesicular fat stranding concerning for cystitis. Please correlate with urinalysis. Stable bilateral renal cortical thinning. No abnormal renal enhancement. No urinary tract calculi or obstructive uropathy. The adrenals are stable. Stomach/Bowel: No bowel obstruction or ileus. The appendix is surgically absent. Scattered colonic diverticulosis, most pronounced within the ascending colon, with no evidence of acute diverticulitis. There is no bowel wall thickening or inflammatory change. No CT findings of bowel wall ischemia. Lymphatic: No pathologic adenopathy. Reproductive: Status post hysterectomy. No adnexal masses. Other: No free fluid or free intraperitoneal gas. No abdominal wall hernia. Prior ventral hernia repair. Musculoskeletal: No acute or destructive bony abnormalities. Reconstructed images demonstrate no additional findings. IMPRESSION: VASCULAR 1. Stent within the origin of the superior mesenteric artery, which  appears widely patent. 2. 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 3. 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 Two accessory left renal artery supplying  the lower pole are widely patent. 4. Chronic right common femoral artery pseudoaneurysm, which has decreased in overall size and developed increased mural thrombus since prior exams. A thin patent neck and minimal central enhancement are identified. If further evaluation is clinically indicated, ultrasound could be performed. 5.  Aortic Atherosclerosis (ICD10-I70.0). NON-VASCULAR 1. Mild diffuse bladder wall thickening and perivesicular fat stranding, compatible with cystitis. Please correlate with urinalysis. 2. Cholelithiasis without cholecystitis. 3. Scattered colonic diverticulosis without diverticulitis. Electronically Signed   By: Ozell Daring M.D.   On: 03/26/2024 15:47    Labs:  CBC: Recent Labs    03/30/24 0216 03/31/24 0521 04/01/24 0246 04/12/24 1120  WBC 6.6 5.9 6.5 7.1  HGB 10.0* 10.8* 10.3* 11.4*  HCT 33.1* 34.0* 32.5* 35.3*  PLT 271 273 251 322    COAGS: Recent Labs    03/28/24 1434  INR 1.0    BMP: Recent Labs    03/30/24 0216 03/31/24 0521 04/01/24 0246 04/12/24 1120  NA 136 137 135 137  K 4.5 4.3 4.2 3.8  CL 97* 101 101 101  CO2 26 24 25 23   GLUCOSE 119* 135* 114* 133*  BUN 19 20 18 15   CALCIUM 8.9 9.1 8.9 9.4  CREATININE 1.07* 0.96 0.84 1.02*  GFRNONAA 49* 56* >60 52*    LIVER FUNCTION TESTS: Recent Labs    03/30/24 0216 03/31/24 0521 04/01/24 0246 04/12/24 1120  BILITOT 0.8 0.6 0.9 1.1  AST 21 16 15 17   ALT 11 12 13 11   ALKPHOS 71 73 64 72  PROT 5.9* 6.1* 5.9* 6.6  ALBUMIN 3.2* 3.2* 3.2* 3.8    TUMOR MARKERS: No results for input(s): AFPTM, CEA, CA199, CHROMGRNA in the last 8760 hours.  Assessment and Plan: Chronic abdominal pain Prior SMA stent for  mesenteric ischemia Patient seen in consultation with Dr. Jennefer today for possible celiac artery stenting. She has elected to proceed with emergent evaluation for uncontrolled abdominal pain.  IR poised for celiac artery angioplasty/stent Thursday as schedule allows.  Continue aspirin  and Plavix  without disruption daily.   Chronic right femoral pseudoaneurysm Will also plan to proceed with thrombin injection of known right femoral pseudoaneurysm.  Patient agreeable to proceed.    Thank you for this interesting consult.  I greatly enjoyed meeting [name] and look forward to participating in their care.  A copy of this report was sent to the requesting provider on this date.  Electronically Signed: Chasitty Hehl Sue-Ellen Emiliano Welshans, PA 04/12/2024, 5:44 PM   I spent a total of 20 Minutes    in face to face in clinical consultation, greater than 50% of which was counseling/coordinating care for chronic abdominal pain.

## 2024-04-12 NOTE — ED Provider Notes (Signed)
 [facility] [facility] AT [facility] Provider Note   CSN: [id number] Arrival date & time: 04/12/24  1026     Patient presents with: Abdominal Pain   [name] is a 89 y.o. female.  Patient with past history significant for type 2 diabetes, hypertension, hyperlipidemia, mesenteric artery stenosis presents emergency department concerns of abdominal pain.  Reports that she was seen earlier today by Dr. Jennefer, IR, who discussed possible invention of the patient given prior stent placement in the SMA.  Patient presents today with concerns for continued abdominal pain that is present at all times and she describes as a burning sensation.  She does state the pain typically worsens after eating.  Denies any vomiting or diarrhea.  Does report poor stool output due to very little oral intake from her persistent pain. Had SMA stent placement on 04/11/2023.   Abdominal Pain      Prior to Admission medications   Medication Sig Start Date End Date Taking? Authorizing Provider  acetaminophen  (TYLENOL ) 325 MG tablet TAKE (2) TABLETS BY MOUTH EVERY SIX HOURS AS NEEDED FOR PAIN. MAX 3GM IN 24 HOURS. 07/28/23   [name], MD  albuterol  (VENTOLIN  HFA) 108 (90 Base) MCG/ACT inhaler INHALE 2 PUFFS INTO THE LUNGS EVERY SIX HOURS AS NEEDED FOR WHEEZING OR SHORTNESS OF BREATH. 09/26/23   [name], MD  ALPRAZolam  (XANAX ) 0.25 MG tablet Take 1 tablet (0.25 mg total) by mouth at bedtime. 04/02/24   Danford, Lonni SQUIBB, MD  amLODipine  (NORVASC ) 10 MG tablet TAKE (1) TABLET BY MOUTH ONCE DAILY. 10/07/23   [name], MD  aspirin  (ASPIRIN  LOW DOSE) 81 MG chewable tablet CHEW (1) TABLET BY MOUTH ONCE DAILY. 09/06/23   [name], MD  BETA CAROTENE PROVITAMIN A 25000 units capsule TAKE (1) CAPSULE BY MOUTH AT BEDTIME. 02/24/24   Luking, Glendia LABOR, MD  BISACODYL  5 MG EC tablet TAKE 1 TABLET BY MOUTH ONCE DAILY AS NEEDED FOR MODERATE CONSTIPATION. 12/20/22   [name], MD   cyanocobalamin  1000 MCG tablet Take 1,000 mcg by mouth at bedtime.    [provider]  estradiol  (ESTRACE ) 0.1 MG/GM vaginal cream Discard plastic applicator. Insert a blueberry size amount (approximately 1 gram) of cream on fingertip inside vagina at bedtime every night for 1 week then every other night. For long term use. 01/15/24   Gerldine Lauraine BROCKS, FNP  gabapentin  (NEURONTIN ) 100 MG capsule 1 qam for neuropath- new direction 04/05/24   Luking, Glendia LABOR, MD  glucose blood (EASYMAX TEST) test strip CHECK BLOOD SUGAR ONCE DAILY.(CALL MD IF BS BELOW 60: OR IF BS ABOVE 400) 10/07/23   Luking, Glendia LABOR, MD  hydrALAZINE  (APRESOLINE ) 25 MG tablet Take 1 tablet (25 mg total) by mouth 3 (three) times daily. 12/31/23   Luking, Glendia LABOR, MD  IVIZIA DRY EYES 0.5 % SOLN Place 1 drop into both eyes 2 (two) times daily. 09/22/23   [provider]  ketoconazole  (NIZORAL ) 2 % cream Apply 1 Application topically 2 (two) times daily. For 10 days to groin rash 03/03/24   [name], MD  Melatonin 10 MG TABS Take 10 mg by mouth at bedtime. 03/24/24   Grooms, Courtney, PA-C  METAMUCIL 4 IN 1 FIBER 25 % PACK Take by mouth 2 (two) times daily. 03/25/24   [provider]  metFORMIN  (GLUCOPHAGE ) 500 MG tablet TAKE (1) TABLET BY MOUTH IN THE MORNING & (1/2) TABLET (250MG ) BY MOUTH AT SUPPER 10/07/23  [name], MD  methenamine  (HIPREX ) 1 g tablet Take 1 tablet (1 g total) by mouth 2 (two) times daily with a meal. Most effective when taken with a daily Vitamin C supplement. 01/15/24   Gerldine Lauraine BROCKS, FNP  Multiple Vitamins-Minerals (PRESERVISION AREDS 2) CAPS Take 1 capsule by mouth in the morning and at bedtime.    [provider]  mupirocin  ointment (BACTROBAN ) 2 % Apply thin amount twice daily to rash in groin creases twice daily for 5 days at a time when rash occurs 03/07/24   [name], MD  ondansetron  (ZOFRAN -ODT) 4 MG disintegrating tablet 4mg  ODT q4 hours prn nausea/vomit 09/05/23    Zammit, Joseph, MD  oxyCODONE -acetaminophen  (PERCOCET/ROXICET) 5-325 MG tablet Take 1 tablet by mouth every 4 (four) hours as needed for moderate pain (pain score 4-6) or severe pain (pain score 7-10). 04/02/24   Danford, Lonni SQUIBB, MD  pantoprazole  (PROTONIX ) 40 MG tablet Take 1 tablet (40 mg total) by mouth 2 (two) times daily before a meal. 12/24/23   Cook, Jayce G, DO  polyethylene glycol powder (GOODSENSE CLEARLAX) 17 GM/SCOOP powder 1 scoop in 8 ounces water  once daily as needed for constipation 06/03/23   Luking, Glendia LABOR, MD  Safety Lancets 28G MISC CHECK BLOOD SUGAR ONCE DAILY. 01/21/24   [name], MD  senna-docusate (SENOKOT-S) 8.6-50 MG tablet Take 1 tablet by mouth at bedtime. 04/02/24   Danford, Lonni SQUIBB, MD  sodium chloride  1 g tablet TAKE (1) TABLET BY MOUTH TWICE DAILY WITH A MEAL. 01/26/24   [name], MD  tamsulosin  (FLOMAX ) 0.4 MG CAPS capsule Take 1 capsule (0.4 mg total) by mouth daily. 03/08/24   Matilda Senior, MD  valsartan  (DIOVAN ) 320 MG tablet Take 1 tablet (320 mg total) by mouth daily. 12/22/23   [name], MD  Vitamin D , Cholecalciferol , 10 MCG (400 UNIT) TABS TAKE (1) TABLET BY MOUTH ONCE DAILY. 10/07/23   [name], MD    Allergies: Statins, Phenergan [promethazine hcl], Hydrocortisone, Lisinopril, Prednisone , and Trazodone  and nefazodone    Review of Systems  Gastrointestinal:  Positive for abdominal pain.  All other systems reviewed and are negative.   Updated Vital Signs BP (!) 143/58   Pulse 66   Temp 97.9 F (36.6 C)   Resp 18   Ht 5' 8 (1.727 m)   Wt 75.3 kg   SpO2 96%   BMI 25.24 kg/m   Physical Exam Vitals and nursing note reviewed.  Constitutional:      General: She is not in acute distress.    Appearance: She is well-developed.  HENT:     Head: Normocephalic and atraumatic.  Eyes:     Conjunctiva/sclera: Conjunctivae normal.  Cardiovascular:     Rate and Rhythm: Normal rate and regular rhythm.     Heart  sounds: No murmur heard. Pulmonary:     Effort: Pulmonary effort is normal. No respiratory distress.     Breath sounds: Normal breath sounds.  Abdominal:     Palpations: Abdomen is soft.     Tenderness: There is generalized abdominal tenderness and tenderness in the right lower quadrant, suprapubic area and left lower quadrant.  Musculoskeletal:        General: No swelling.     Cervical back: Neck supple.  Skin:    General: Skin is warm and dry.     Capillary Refill: Capillary refill takes less than 2 seconds.  Neurological:     Mental Status: She  is alert.  Psychiatric:        Mood and Affect: Mood normal.     (all labs ordered are listed, but only abnormal results are displayed) Labs Reviewed  COMPREHENSIVE METABOLIC PANEL WITH GFR - Abnormal; Notable for the following components:      Result Value   Glucose, Bld 133 (*)    Creatinine, Ser 1.02 (*)    GFR, Estimated 52 (*)    All other components within normal limits  CBC - Abnormal; Notable for the following components:   Hemoglobin 11.4 (*)    HCT 35.3 (*)    All other components within normal limits  LIPASE, BLOOD  URINALYSIS, ROUTINE W REFLEX MICROSCOPIC    EKG: None  Radiology: IR Radiologist Eval & Mgmt Result Date: 04/12/2024 EXAM: ESTABLISHED PATIENT OFFICE VISIT CHIEF COMPLAINT: See Epic note. HISTORY OF PRESENT ILLNESS: See Epic note. REVIEW OF SYSTEMS: See Epic note. PHYSICAL EXAMINATION: See Epic note. ASSESSMENT AND PLAN: See Epic note. Ester Sides, MD [facility] Electronically Signed   By: Ester Sides M.D.   On: 04/12/2024 10:18     Procedures   Medications Ordered in the ED  morphine  (PF) 2 MG/ML injection 2 mg (2 mg Intravenous Given 04/12/24 1319)    Clinical Course as of 04/12/24 1351  Mon Apr 12, 2024  1305 Spoke with Dr. Philip, IR, who advised admission for pain control and IR to round on her with planned management in a few days  with Dr. Sides. [OZ]  1351 Spoke with Dr. Drusilla, hospitalist, who will be admitting patient. [OZ]    Clinical Course User Index [OZ] Cecily Legrand LABOR, PA-C                                 Medical Decision Making Amount and/or Complexity of Data Reviewed Labs: ordered.  Risk Prescription drug management.   This patient presents to the ED for concern of abdominal pain, this involves an extensive number of treatment options, and is a complaint that carries with it a high risk of complications and morbidity.  The differential diagnosis includes bowel obstruction, mesenteric artery stenosis, mesenteric angina, gastroenteritis   Co morbidities that complicate the patient evaluation  Mesenteric artery stenosis, mesenteric angina   Lab Tests:  I Ordered, and personally interpreted labs.  The pertinent results include: CBC unremarkable, CMP with slight renal impairment at baseline, lipase normal at 22, UA pending   Cardiac Monitoring: / EKG:  The patient was maintained on a cardiac monitor.  I personally viewed and interpreted the cardiac monitored which showed an underlying rhythm of: Normal sinus rhythm with sinus arrhythmia   Consultations Obtained:  I requested consultation with the IR, hospitalist,  and discussed lab and imaging findings as well as pertinent plan - they recommend: Spoke with Dr. Philip, IR, who advised admission for pain control and IR intervention in the next several days with Dr. Sides.  Spoke with Dr. Drusilla, hospitalist, who will be admitting patient for further pain control.   Problem List / ED Course / Critical interventions / Medication management  Patient with past history significant for mesenteric stenosis presents to the emergency department with concerns of abdominal pain.  Reports abdominal pain has been ongoing for several months and not acutely improving and typically worsens with eating.  Describes the pain as a burning sensation that is present  primarily in the lower abdomen  but radiates throughout the entire abdomen.  Endorses some nausea but denies any vomiting or diarrhea.  States poor oral intake due to abdominal pain. On exam, patient has focal pain towards the lower quadrants of the abdomen.  No normal bowel sounds with slight diminished.  Otherwise well-appearing. Spoke with Dr. Philip, IR, regarding patient and possible interventions or imaging.  He advised medicine admission for pain control and IR will be available for consult. Spoke with Dr. Drusilla, hospitalist, who will be admitting patient. I ordered medication including morphine  for pain Reevaluation of the patient after these medicines showed that the patient improved I have reviewed the patients home medicines and have made adjustments as needed   Social Determinants of Health:  None   Test / Admission - Considered:  Patient requiring admission for IR management and pain control.  Final diagnoses:  Mesenteric artery stenosis  Intractable abdominal pain    ED Discharge Orders     None          Cecily Legrand LABOR, PA-C 04/12/24 1348    Kammerer, Megan L, DO 04/13/24 1136

## 2024-04-12 NOTE — ED Triage Notes (Signed)
 Pt states she feels like she burning up on the inside and cold on the outside. Pt seen recently for similar symptoms, went to MD today and was told to come to ED for admission. Pt c/o weakness, unable to eat, and nausea. Pt states her bowel movements have been very small, but has only been able to eat yogurt and boost shakes.

## 2024-04-12 NOTE — H&P (Signed)
 TRH H&P    Patient Demographics:    [name], is a 89 y.o. female  MRN: [id number]  DOB - 1931/07/10  Admit Date - 04/12/2024  Referring MD/NP/PA: Legrand Angle  Outpatient Primary MD for the patient is [name], MD  Patient coming from: IR clinic  Chief complaint-abdominal pain   HPI:    [name]  is a 89 y.o. 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  At that time she was seen by IR and vascular surgery and they had deemed that stable celiac artery stenosis is not the cause of her symptoms.  Plan was to follow-up with GI as outpatient. Today patient went for follow-up to IR clinic, she continued to complain of abdominal pain which she describes as burning.  Patient says that she has not not been able to eat for past 1 week.  The only thing she eats is broth, chicken soup or yogurt.  Denies vomiting, has been complaining of nausea.  No diarrhea she did have a small BM today.. I had sent her to the ED for admission and possible stent placement in the celiac artery for celiac artery stenosis. She denies chest pain or shortness of breath Has been having intermittent cough. Denies vomiting or diarrhea No fever or dysuria     Review of systems:    In addition to the HPI above,    All other systems reviewed and are negative.    Past History of the following :    Past Medical History:  Diagnosis Date   Blood transfusion without reported diagnosis    Cognitive dysfunction 03/04/2019   Patient scores 21 out of 30 on Montreal cognitive assessment September 2020   Diabetes mellitus without  complication (HCC)    Diabetic peripheral neuropathy associated with type 2 diabetes mellitus (HCC) 02/25/2022   Diverticulitis    Frailty 03/04/2019   H/O bilateral breast reduction surgery    Hyperlipidemia    a. intolerant to statins.    Hypertension       Past Surgical History:  Procedure Laterality Date   ABDOMINAL HYSTERECTOMY  2003   APPENDECTOMY     age 17   BIOPSY  04/10/2023   Procedure: BIOPSY;  Surgeon: Cinderella Deatrice FALCON, MD;  Location: [facility];  Service: Endoscopy;;   BREAST REDUCTION SURGERY     age 54   COLON SURGERY Left 2011   Hemicolectomy due to diverticulitis   ESOPHAGOGASTRODUODENOSCOPY (EGD) WITH PROPOFOL  N/A 04/10/2023   Procedure: ESOPHAGOGASTRODUODENOSCOPY (EGD) WITH PROPOFOL ;  Surgeon: Cinderella Deatrice FALCON, MD;  Location: [facility];  Service: Endoscopy;  Laterality: N/A;   EYE SURGERY  03/30/2009   cataract   IR ANGIOGRAM VISCERAL SELECTIVE  04/11/2023   IR INTRAVASCULAR ULTRASOUND NON CORONARY  04/11/2023   IR RADIOLOGIST  EVAL & MGMT  04/12/2024   IR TRANSCATH PLC STENT 1ST ART NOT LE CV CAR VERT CAR  04/11/2023   IR US  GUIDE VASC ACCESS RIGHT  04/11/2023   KNEE SURGERY Right 2003   knee cap   LAPAROSCOPIC INCISIONAL / UMBILICAL / VENTRAL HERNIA REPAIR  02/23/2007   REDUCTION MAMMAPLASTY Bilateral 2001   REFRACTIVE SURGERY     TOTAL KNEE ARTHROPLASTY Right 04/10/2022   Procedure: TOTAL KNEE ARTHROPLASTY;  Surgeon: Gerome Charleston, MD;  Location: [facility];  Service: Orthopedics;  Laterality: Right;  adductor canal      Social History:      Social History   Tobacco Use   Smoking status: Never   Smokeless tobacco: Never  Substance Use Topics   Alcohol  use: No       Family History :     Family History  Problem Relation Age of Onset   Cancer Mother        ovaian   Hypertension Father        kidney   Stroke Father    Cancer Father    Hypertension Sister    Diabetes Brother       Home Medications:   Prior to Admission  medications   Medication Sig Start Date End Date Taking? Authorizing Provider  acetaminophen  (TYLENOL ) 325 MG tablet TAKE (2) TABLETS BY MOUTH EVERY SIX HOURS AS NEEDED FOR PAIN. MAX 3GM IN 24 HOURS. 07/28/23   [name], MD  albuterol  (VENTOLIN  HFA) 108 (90 Base) MCG/ACT inhaler INHALE 2 PUFFS INTO THE LUNGS EVERY SIX HOURS AS NEEDED FOR WHEEZING OR SHORTNESS OF BREATH. 09/26/23   [name], MD  ALPRAZolam  (XANAX ) 0.25 MG tablet Take 1 tablet (0.25 mg total) by mouth at bedtime. 04/02/24   Danford, Lonni SQUIBB, MD  amLODipine  (NORVASC ) 10 MG tablet TAKE (1) TABLET BY MOUTH ONCE DAILY. 10/07/23   [name], MD  aspirin  (ASPIRIN  LOW DOSE) 81 MG chewable tablet CHEW (1) TABLET BY MOUTH ONCE DAILY. 09/06/23   [name], MD  BETA CAROTENE PROVITAMIN A 25000 units capsule TAKE (1) CAPSULE BY MOUTH AT BEDTIME. 02/24/24   Luking, Glendia LABOR, MD  BISACODYL  5 MG EC tablet TAKE 1 TABLET BY MOUTH ONCE DAILY AS NEEDED FOR MODERATE CONSTIPATION. 12/20/22   [name], MD  cyanocobalamin  1000 MCG tablet Take 1,000 mcg by mouth at bedtime.    [provider]  estradiol  (ESTRACE ) 0.1 MG/GM vaginal cream Discard plastic applicator. Insert a blueberry size amount (approximately 1 gram) of cream on fingertip inside vagina at bedtime every night for 1 week then every other night. For long term use. 01/15/24   Gerldine Lauraine BROCKS, FNP  gabapentin  (NEURONTIN ) 100 MG capsule 1 qam for neuropath- new direction 04/05/24   Luking, Glendia LABOR, MD  glucose blood (EASYMAX TEST) test strip CHECK BLOOD SUGAR ONCE DAILY.(CALL MD IF BS BELOW 60: OR IF BS ABOVE 400) 10/07/23   Luking, Glendia LABOR, MD  hydrALAZINE  (APRESOLINE ) 25 MG tablet Take 1 tablet (25 mg total) by mouth 3 (three) times daily. 12/31/23   Luking, Glendia LABOR, MD  IVIZIA DRY EYES 0.5 % SOLN Place 1 drop into both eyes 2 (two) times daily. 09/22/23   [provider]  ketoconazole  (NIZORAL ) 2 % cream Apply 1 Application topically 2 (two) times daily.  For 10 days to groin rash 03/03/24   [name], MD  Melatonin 10 MG TABS Take 10 mg by mouth at bedtime. 03/24/24  Grooms, Courtney, PA-C  METAMUCIL 4 IN 1 FIBER 25 % PACK Take by mouth 2 (two) times daily. 03/25/24   [provider]  metFORMIN  (GLUCOPHAGE ) 500 MG tablet TAKE (1) TABLET BY MOUTH IN THE MORNING & (1/2) TABLET (250MG ) BY MOUTH AT SUPPER 10/07/23   Luking, Glendia LABOR, MD  methenamine  (HIPREX ) 1 g tablet Take 1 tablet (1 g total) by mouth 2 (two) times daily with a meal. Most effective when taken with a daily Vitamin C supplement. 01/15/24   Gerldine Lauraine BROCKS, FNP  Multiple Vitamins-Minerals (PRESERVISION AREDS 2) CAPS Take 1 capsule by mouth in the morning and at bedtime.    [provider]  mupirocin  ointment (BACTROBAN ) 2 % Apply thin amount twice daily to rash in groin creases twice daily for 5 days at a time when rash occurs 03/07/24   [name], MD  ondansetron  (ZOFRAN -ODT) 4 MG disintegrating tablet 4mg  ODT q4 hours prn nausea/vomit 09/05/23   Zammit, Joseph, MD  oxyCODONE -acetaminophen  (PERCOCET/ROXICET) 5-325 MG tablet Take 1 tablet by mouth every 4 (four) hours as needed for moderate pain (pain score 4-6) or severe pain (pain score 7-10). 04/02/24   Danford, Lonni SQUIBB, MD  pantoprazole  (PROTONIX ) 40 MG tablet Take 1 tablet (40 mg total) by mouth 2 (two) times daily before a meal. 12/24/23   Cook, Jayce G, DO  polyethylene glycol powder (GOODSENSE CLEARLAX) 17 GM/SCOOP powder 1 scoop in 8 ounces water  once daily as needed for constipation 06/03/23   Luking, Glendia LABOR, MD  Safety Lancets 28G MISC CHECK BLOOD SUGAR ONCE DAILY. 01/21/24   [name], MD  senna-docusate (SENOKOT-S) 8.6-50 MG tablet Take 1 tablet by mouth at bedtime. 04/02/24   Danford, Lonni SQUIBB, MD  sodium chloride  1 g tablet TAKE (1) TABLET BY MOUTH TWICE DAILY WITH A MEAL. 01/26/24   [name], MD  tamsulosin  (FLOMAX ) 0.4 MG CAPS capsule Take 1 capsule (0.4 mg total) by mouth daily.  03/08/24   Matilda Senior, MD  valsartan  (DIOVAN ) 320 MG tablet Take 1 tablet (320 mg total) by mouth daily. 12/22/23   [name], MD  Vitamin D , Cholecalciferol , 10 MCG (400 UNIT) TABS TAKE (1) TABLET BY MOUTH ONCE DAILY. 10/07/23   [name], MD     Allergies:     Allergies  Allergen Reactions   Statins Other (See Comments)    Severe myalgias   Phenergan [Promethazine Hcl] Other (See Comments)    Hallucinations    Hydrocortisone Itching   Lisinopril Other (See Comments)    Lethargy, Fatigue   Prednisone  Rash   Trazodone  And Nefazodone Cough     Physical Exam:   Vitals  Blood pressure (!) 143/58, pulse 66, temperature 97.9 F (36.6 C), resp. rate 18, height 5' 8 (1.727 m), weight 75.3 kg, SpO2 96%.  1.  General: Appears in no acute distress  2. Psychiatric: Alert, oriented x 3, intact insight and judgment  3. Neurologic: Cranial nerves II through XII grossly intact, no focal deficit noted  4. HEENMT:  Atraumatic normocephalic, extraocular's are intact  5. Respiratory : Lungs are clear to auscultation bilaterally  6. Cardiovascular : S1-S2, regular, no murmur auscultated  7. Gastrointestinal:  Abdomen soft, mild tenderness in left lower quadrant, no rigidity or guarding      Data Review:    CBC Recent Labs  Lab 04/12/24 1120  WBC 7.1  HGB 11.4*  HCT 35.3*  PLT 322  MCV 84.9  MCH 27.4  MCHC  32.3  RDW 14.6   ------------------------------------------------------------------------------------------------------------------  Results for orders placed or performed during the hospital encounter of 04/12/24 (from the past 48 hours)  Lipase, blood     Status: None   Collection Time: 04/12/24 11:20 AM  Result Value Ref Range   Lipase 22 11 - 51 U/L    Comment: Performed at [facility], [address], [place], [place] [address]  Comprehensive metabolic panel     Status: Abnormal   Collection Time: 04/12/24 11:20 AM  Result Value  Ref Range   Sodium 137 135 - 145 mmol/L   Potassium 3.8 3.5 - 5.1 mmol/L   Chloride 101 98 - 111 mmol/L   CO2 23 22 - 32 mmol/L   Glucose, Bld 133 (H) 70 - 99 mg/dL    Comment: Glucose reference range applies only to samples taken after fasting for at least 8 hours.   BUN 15 8 - 23 mg/dL   Creatinine, Ser 8.97 (H) 0.44 - 1.00 mg/dL   Calcium 9.4 8.9 - 89.6 mg/dL   Total Protein 6.6 6.5 - 8.1 g/dL   Albumin 3.8 3.5 - 5.0 g/dL   AST 17 15 - 41 U/L   ALT 11 0 - 44 U/L   Alkaline Phosphatase 72 38 - 126 U/L   Total Bilirubin 1.1 0.0 - 1.2 mg/dL   GFR, Estimated 52 (L) >60 mL/min    Comment: (NOTE) Calculated using the CKD-EPI Creatinine Equation (2021)    Anion gap 13 5 - 15    Comment: Performed at [facility], [address], [place], [place] [address]  CBC     Status: Abnormal   Collection Time: 04/12/24 11:20 AM  Result Value Ref Range   WBC 7.1 4.0 - 10.5 K/uL   RBC 4.16 3.87 - 5.11 MIL/uL   Hemoglobin 11.4 (L) 12.0 - 15.0 g/dL   HCT 64.6 (L) 63.9 - 53.9 %   MCV 84.9 80.0 - 100.0 fL   MCH 27.4 26.0 - 34.0 pg   MCHC 32.3 30.0 - 36.0 g/dL   RDW 85.3 88.4 - 84.4 %   Platelets 322 150 - 400 K/uL   nRBC 0.0 0.0 - 0.2 %    Comment: Performed at [facility], [address], [place], [place] [address]    Chemistries  Recent Labs  Lab 04/12/24 1120  NA 137  K 3.8  CL 101  CO2 23  GLUCOSE 133*  BUN 15  CREATININE 1.02*  CALCIUM 9.4  AST 17  ALT 11  ALKPHOS 72  BILITOT 1.1   ------------------------------------------------------------------------------------------------------------------  ------------------------------------------------------------------------------------------------------------------ GFR: Estimated Creatinine Clearance: 37 mL/min (A) (by C-G formula based on SCr of 1.02 mg/dL (H)). Liver Function Tests: Recent Labs  Lab 04/12/24 1120  AST 17  ALT 11  ALKPHOS 72  BILITOT 1.1  PROT 6.6  ALBUMIN 3.8   Recent Labs  Lab  04/12/24 1120  LIPASE 22    --------------------------------------------------------------------------------------------------------------- Urine analysis:    Component Value Date/Time   COLORURINE YELLOW 03/28/2024 1048   APPEARANCEUR TURBID (A) 03/28/2024 1048   APPEARANCEUR Clear 01/15/2024 1310   LABSPEC 1.009 03/28/2024 1048   PHURINE 6.0 03/28/2024 1048   GLUCOSEU NEGATIVE 03/28/2024 1048   HGBUR NEGATIVE 03/28/2024 1048   BILIRUBINUR NEGATIVE 03/28/2024 1048   BILIRUBINUR negative 03/03/2024 0917   BILIRUBINUR Negative 01/15/2024 1310   KETONESUR NEGATIVE 03/28/2024 1048   PROTEINUR 30 (A) 03/28/2024 1048   UROBILINOGEN 0.2 03/03/2024 0917   UROBILINOGEN  0.2 12/15/2014 1956   NITRITE POSITIVE (A) 03/28/2024 1048   LEUKOCYTESUR LARGE (A) 03/28/2024 1048      Imaging Results:    IR Radiologist Eval & Mgmt Result Date: 04/12/2024 EXAM: ESTABLISHED PATIENT OFFICE VISIT CHIEF COMPLAINT: See Epic note. HISTORY OF PRESENT ILLNESS: See Epic note. REVIEW OF SYSTEMS: See Epic note. PHYSICAL EXAMINATION: See Epic note. ASSESSMENT AND PLAN: See Epic note. Ester Sides, MD [facility] Electronically Signed   By: Ester Sides M.D.   On: 04/12/2024 10:18       Assessment & Plan:    Principal Problem:   Mesenteric ischemia   Abdominal pain-concern for mesenteric ischemia.  Patient is status post SMA stent for chronic mesenteric ischemia on 04/11/2023.  Recently hospitalized for abdominal pain which was felt to be GI in origin.  Now coming back to the hospital with the similar complaint.  CTA abdomen/pelvis 2 weeks ago had showed celiac artery stenosis.  IR wants to perform stenting of the celiac artery to see if this may improve her symptoms.  IR will be consulted for the same.  In the meantime we will start patient on Maalox 30 cc every 4 hours x 3 doses.  Continue Protonix  40 mg p.o. twice daily.  Will keep her NPO.   Continue D5 LR at 50 mL/h.  Diabetes mellitus type 2-last A1c was 6.3 in June 2025.  Metformin  is currently on hold.  Will start sliding scale insulin  with NovoLog .  Hypertension-continue amlodipine .  Will hold hydralazine , add hydralazine  as needed  Generalized Zaidi disorder-continue Xanax     DVT Prophylaxis-   Lovenox    AM Labs Ordered, also please review Full Orders  Family Communication: Admission, patients condition and plan of care including tests being ordered have been discussed with the patient  who indicate understanding and agree with the plan and Code Status.  Code Status: DNR  Admission status: Observation     Time spent in minutes : 60 minutes   Railynn Ballo S Jana Swartzlander M.D

## 2024-04-12 NOTE — Plan of Care (Signed)

## 2024-04-13 ENCOUNTER — Telehealth: Payer: Self-pay

## 2024-04-13 DIAGNOSIS — Z7982 Long term (current) use of aspirin: Secondary | ICD-10-CM | POA: Diagnosis not present

## 2024-04-13 DIAGNOSIS — Z833 Family history of diabetes mellitus: Secondary | ICD-10-CM | POA: Diagnosis not present

## 2024-04-13 DIAGNOSIS — Z794 Long term (current) use of insulin: Secondary | ICD-10-CM | POA: Diagnosis not present

## 2024-04-13 DIAGNOSIS — T81718A Complication of other artery following a procedure, not elsewhere classified, initial encounter: Secondary | ICD-10-CM | POA: Diagnosis not present

## 2024-04-13 DIAGNOSIS — I724 Aneurysm of artery of lower extremity: Secondary | ICD-10-CM | POA: Diagnosis not present

## 2024-04-13 DIAGNOSIS — I771 Stricture of artery: Secondary | ICD-10-CM | POA: Diagnosis present

## 2024-04-13 DIAGNOSIS — Z79899 Other long term (current) drug therapy: Secondary | ICD-10-CM | POA: Diagnosis not present

## 2024-04-13 DIAGNOSIS — G8929 Other chronic pain: Secondary | ICD-10-CM | POA: Diagnosis present

## 2024-04-13 DIAGNOSIS — I10 Essential (primary) hypertension: Secondary | ICD-10-CM | POA: Diagnosis present

## 2024-04-13 DIAGNOSIS — K559 Vascular disorder of intestine, unspecified: Secondary | ICD-10-CM | POA: Diagnosis not present

## 2024-04-13 DIAGNOSIS — K55059 Acute (reversible) ischemia of intestine, part and extent unspecified: Secondary | ICD-10-CM | POA: Diagnosis present

## 2024-04-13 DIAGNOSIS — Z66 Do not resuscitate: Secondary | ICD-10-CM | POA: Diagnosis present

## 2024-04-13 DIAGNOSIS — E1142 Type 2 diabetes mellitus with diabetic polyneuropathy: Secondary | ICD-10-CM | POA: Diagnosis present

## 2024-04-13 DIAGNOSIS — K219 Gastro-esophageal reflux disease without esophagitis: Secondary | ICD-10-CM | POA: Diagnosis not present

## 2024-04-13 DIAGNOSIS — Z7902 Long term (current) use of antithrombotics/antiplatelets: Secondary | ICD-10-CM | POA: Diagnosis not present

## 2024-04-13 DIAGNOSIS — Z7984 Long term (current) use of oral hypoglycemic drugs: Secondary | ICD-10-CM | POA: Diagnosis not present

## 2024-04-13 DIAGNOSIS — F411 Generalized anxiety disorder: Secondary | ICD-10-CM | POA: Diagnosis present

## 2024-04-13 DIAGNOSIS — R109 Unspecified abdominal pain: Secondary | ICD-10-CM | POA: Diagnosis not present

## 2024-04-13 DIAGNOSIS — K551 Chronic vascular disorders of intestine: Secondary | ICD-10-CM | POA: Diagnosis present

## 2024-04-13 DIAGNOSIS — Z8249 Family history of ischemic heart disease and other diseases of the circulatory system: Secondary | ICD-10-CM | POA: Diagnosis not present

## 2024-04-13 DIAGNOSIS — K5900 Constipation, unspecified: Secondary | ICD-10-CM | POA: Diagnosis present

## 2024-04-13 DIAGNOSIS — E785 Hyperlipidemia, unspecified: Secondary | ICD-10-CM | POA: Diagnosis present

## 2024-04-13 DIAGNOSIS — Z96651 Presence of right artificial knee joint: Secondary | ICD-10-CM | POA: Diagnosis present

## 2024-04-13 LAB — COMPREHENSIVE METABOLIC PANEL WITH GFR
ALT: 8 U/L (ref 0–44)
AST: 19 U/L (ref 15–41)
Albumin: 3 g/dL — ABNORMAL LOW (ref 3.5–5.0)
Alkaline Phosphatase: 57 U/L (ref 38–126)
Anion gap: 9 (ref 5–15)
BUN: 20 mg/dL (ref 8–23)
CO2: 28 mmol/L (ref 22–32)
Calcium: 8.8 mg/dL — ABNORMAL LOW (ref 8.9–10.3)
Chloride: 100 mmol/L (ref 98–111)
Creatinine, Ser: 1.07 mg/dL — ABNORMAL HIGH (ref 0.44–1.00)
GFR, Estimated: 49 mL/min — ABNORMAL LOW (ref >60)
Glucose, Bld: 112 mg/dL — ABNORMAL HIGH (ref 70–99)
Potassium: 4.2 mmol/L (ref 3.5–5.1)
Sodium: 137 mmol/L (ref 135–145)
Total Bilirubin: 1.3 mg/dL — ABNORMAL HIGH (ref 0.0–1.2)
Total Protein: 5.5 g/dL — ABNORMAL LOW (ref 6.5–8.1)

## 2024-04-13 LAB — URINALYSIS, ROUTINE W REFLEX MICROSCOPIC
Bilirubin Urine: NEGATIVE
Glucose, UA: NEGATIVE mg/dL
Hgb urine dipstick: NEGATIVE
Ketones, ur: NEGATIVE mg/dL
Leukocytes,Ua: NEGATIVE
Nitrite: NEGATIVE
Protein, ur: NEGATIVE mg/dL
Specific Gravity, Urine: 1.005 (ref 1.005–1.030)
pH: 7 (ref 5.0–8.0)

## 2024-04-13 LAB — CBC
HCT: 32 % — ABNORMAL LOW (ref 36.0–46.0)
Hemoglobin: 10.3 g/dL — ABNORMAL LOW (ref 12.0–15.0)
MCH: 27.8 pg (ref 26.0–34.0)
MCHC: 32.2 g/dL (ref 30.0–36.0)
MCV: 86.5 fL (ref 80.0–100.0)
Platelets: 264 K/uL (ref 150–400)
RBC: 3.7 MIL/uL — ABNORMAL LOW (ref 3.87–5.11)
RDW: 14.8 % (ref 11.5–15.5)
WBC: 5.8 K/uL (ref 4.0–10.5)
nRBC: 0 % (ref 0.0–0.2)

## 2024-04-13 LAB — GLUCOSE, CAPILLARY
Glucose-Capillary: 130 mg/dL — ABNORMAL HIGH (ref 70–99)
Glucose-Capillary: 126 mg/dL — ABNORMAL HIGH (ref 70–99)
Glucose-Capillary: 131 mg/dL — ABNORMAL HIGH (ref 70–99)

## 2024-04-13 MED ORDER — CYCLOSPORINE 0.05 % OP EMUL
1.0000 [drp] | Freq: Two times a day (BID) | OPHTHALMIC | Status: DC
Start: 2024-04-13 — End: 2024-04-22
  Administered 2024-04-13 – 2024-04-22 (×17): 1 [drp] via OPHTHALMIC
  Filled 2024-04-13 (×20): qty 30

## 2024-04-13 MED ORDER — SUCRALFATE 1 GM/10ML PO SUSP
1.0000 g | Freq: Three times a day (TID) | ORAL | Status: DC
  Administered 2024-04-13: 1 g via ORAL
  Filled 2024-04-13: qty 10

## 2024-04-13 MED ORDER — ALUM & MAG HYDROXIDE-SIMETH 200-200-20 MG/5ML PO SUSP
15.0000 mL | Freq: Four times a day (QID) | ORAL | Status: DC | PRN
  Administered 2024-04-13 (×2): 15 mL via ORAL
  Filled 2024-04-13: qty 30

## 2024-04-13 MED ORDER — VITAMINS A & D EX OINT
TOPICAL_OINTMENT | CUTANEOUS | Status: DC | PRN
  Administered 2024-04-15: 113 via TOPICAL
  Filled 2024-04-13 (×2): qty 56.7

## 2024-04-13 NOTE — Progress Notes (Signed)
 [facility]  PROGRESS NOTE  [name] FMW:[id number] DOB: 1932-05-13 DOA: 04/12/2024 PCP: [name], MD   Brief HPI:    89 y.o. 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  At that time she was seen by IR and vascular surgery and they had deemed that stable celiac artery stenosis is not the cause of her symptoms.  Plan was to follow-up with GI as outpatient. Today patient went for follow-up to IR clinic, she continued to complain of abdominal pain which she describes as burning.  Patient says that she has not not been able to eat for past 1 week.  The only thing she eats is broth, chicken soup or yogurt.  Denies vomiting, has been complaining of nausea.  No diarrhea she did have a small BM today.. I had sent her to the ED for admission and possible stent placement in the celiac artery for celiac artery stenosis.    Assessment/Plan:   Heartburn/abdominal pain -Admitted for possible celiac artery stent placement per IR, consulted IR -Tentatively celiac artery stent planned for Thursday - Surprisingly patient's symptoms have significantly improved after taking Maalox/Mylanta - If patient continues to have symptoms after celiac artery stent placement, consider GI evaluation - In the past she had EGD which showed Schatzki ring and gastritis - Will continue with Maalox/Mylanta as needed - Continue Protonix  40 mg p.o. twice daily - Patient is currently on aspirin  81 mg p.o. daily  Diabetes mellitus type 2 - Metformin  on hold, continue sliding scale insulin  NovoLog  - CBG well-controlled  Hypertension - Blood pressure well-controlled - Continue amlodipine   Generalized anxiety  disorder - Continue Xanax        Medications     ALPRAZolam   0.25 mg Oral QHS   alum & mag hydroxide-simeth  30 mL Oral Q6H   amLODipine   10 mg Oral Daily   aspirin   81 mg Oral Daily   enoxaparin  (LOVENOX ) injection  40 mg Subcutaneous Q24H   gabapentin   100 mg Oral Daily   insulin  aspart  0-9 Units Subcutaneous TID WC   pantoprazole   40 mg Oral BID AC   tamsulosin   0.4 mg Oral Daily     Data Reviewed:   CBG:  Recent Labs  Lab 04/12/24 1636 04/12/24 1821 04/12/24 2348  GLUCAP 133* 118* 117*    SpO2: 95 %    Vitals:   04/12/24 1552 04/12/24 1600 04/12/24 1631 04/12/24 2349  BP:  (!) 152/52 (!) 146/54 (!) 131/43  Pulse:  68 66 60  Resp:   19 19  Temp: 97.7 F (36.5 C)  97.8 F (36.6 C) 97.9 F (36.6 C)  TempSrc: Oral  Oral Oral  SpO2:  100% 96% 95%  Weight:      Height:          Data Reviewed:  Basic Metabolic Panel: Recent Labs  Lab 04/12/24 1120 04/13/24 0416  NA 137 137  K 3.8 4.2  CL 101 100  CO2 23 28  GLUCOSE 133* 112*  BUN 15 20  CREATININE 1.02* 1.07*  CALCIUM 9.4 8.8*    CBC: Recent Labs  Lab 04/12/24 1120 04/13/24 0416  WBC 7.1 5.8  HGB 11.4* 10.3*  HCT 35.3* 32.0*  MCV 84.9 86.5  PLT 322 264    LFT Recent Labs  Lab 04/12/24 1120 04/13/24 0416  AST 17 19  ALT 11 8  ALKPHOS 72 57  BILITOT 1.1 1.3*  PROT 6.6 5.5*  ALBUMIN 3.8 3.0*     Antibiotics: Anti-infectives (From admission, onward)    None        DVT prophylaxis: Lovenox   Code Status: DNR  Family Communication: No family at bedside   CONSULTS IR   Subjective   Patient's heartburn has significantly improved after starting Maalox   Objective    Physical Examination:   General-appears in no acute distress Heart-S1-S2, regular, no murmur auscultated Lungs-clear to auscultation bilaterally, no wheezing or crackles auscultated Abdomen-soft, nontender, no organomegaly Extremities-no edema in the lower extremities Neuro-alert, oriented  x3, no focal deficit noted   Status is: Inpatient:             Sabas GORMAN Brod   [facility] If 7PM-7AM, please contact night-coverage at [email or web address], Office  [phone]   04/13/2024, 8:03 AM  LOS: 0 days

## 2024-04-13 NOTE — Plan of Care (Signed)

## 2024-04-13 NOTE — TOC Initial Note (Signed)
 Transition of Care [facility]) - Initial/Assessment Note    Patient Details  Name: [name] MRN: [id number] Date of Birth: 06-25-1932  Transition of Care [facility]) CM/SW Contact:    [name] Phone Number: 04/13/2024, 8:45 AM  Clinical Narrative:                 Pt admitted from [facility] due to weakness, unable to eat & nausea. CSW following for needs.   Expected Discharge Plan: Long Term Nursing Home Barriers to Discharge: Continued Medical Work up   Patient Goals and CMS Choice Patient states their goals for this hospitalization and ongoing recovery are:: Return to LTC          Expected Discharge Plan and Services       Living arrangements for the past 2 months: Skilled Nursing Facility                                      Prior Living Arrangements/Services Living arrangements for the past 2 months: Skilled Nursing Facility Lives with:: Facility Resident Patient language and need for interpreter reviewed:: Yes Do you feel safe going back to the place where you live?: Yes      Need for Family Participation in Patient Care: Yes (Comment) Care giver support system in place?: Yes (comment)   Criminal Activity/Legal Involvement Pertinent to Current Situation/Hospitalization: No - Comment as needed  Activities of Daily Living      Permission Sought/Granted Permission sought to share information with : Facility Medical sales representative, Family Supports    Share Information with NAME: [name]  Permission granted to share info w AGENCY: [facility]  Permission granted to share info w Relationship: Son  Permission granted to share info w Contact Information: [phone]  Emotional Assessment Appearance:: Appears stated age Attitude/Demeanor/Rapport: Engaged Affect (typically observed): Pleasant Orientation: : Oriented to Self, Oriented to Place, Oriented to  Time, Oriented to Situation Alcohol  / Substance Use: Not Applicable Psych  Involvement: No (comment)  Admission diagnosis:  Mesenteric artery stenosis [K55.1] Intractable abdominal pain [R10.9] Mesenteric ischemia [K55.9] Patient Active Problem List   Diagnosis Date Noted   Mesenteric ischemia 04/12/2024   DNR (do not resuscitate) discussion 04/01/2024   Occlusive mesenteric ischemia 03/31/2024   Palliative care by specialist 03/29/2024   DNR (do not resuscitate) 03/29/2024   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   Skin rash 03/24/2024   Lesion of nose 03/24/2024   History of Clostridioides difficile colitis 02/18/2024   Overflow diarrhea 02/18/2024   Abnormal findings on diagnostic imaging of other specified body structures 02/18/2024   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/15/2024   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 01/15/2024   Recurrent UTI 01/15/2024   Type 2 diabetes mellitus with hyperglycemia (HCC) 12/28/2023   UTI (urinary tract infection) 12/27/2023   Frequency of urination 12/24/2023   Acute blood loss anemia 04/17/2023   Gastritis and gastroduodenitis 04/10/2023   Abdominal pain, chronic, epigastric 04/08/2023   Mesenteric artery stenosis 04/07/2023   Diabetic neuropathy (HCC) 11/30/2022   S/P total knee arthroplasty, right 04/12/2022   Aortic atherosclerosis 02/25/2022   Chronic non-seasonal allergic rhinitis 02/25/2022   GERD (gastroesophageal reflux disease) 02/17/2022   Myalgia due to statin 02/15/2020   Cognitive dysfunction 03/04/2019   Generalized anxiety disorder 06/05/2018   Hyponatremia 03/08/2018   Osteoarthritis of right knee 12/02/2017   Hyperlipidemia associated with type  2 diabetes mellitus (HCC) 10/02/2016   Insomnia 10/02/2016   Essential hypertension 12/15/2014   Type 2 diabetes mellitus with atherosclerosis of aorta (HCC) 02/15/2011   PCP:  [name], MD Pharmacy:   [facility] [place] - [address] [place] [address] Phone:  [phone] Fax: [phone]     Social Drivers of Health (SDOH) Social History: SDOH Screenings   Food Insecurity: No Food Insecurity (04/12/2024)  Housing: Low Risk  (04/12/2024)  Transportation Needs: No Transportation Needs (04/12/2024)  Utilities: Not At Risk (04/12/2024)  Alcohol  Screen: Low Risk  (09/06/2022)  Depression (PHQ2-9): Low Risk  (03/03/2024)  Financial Resource Strain: Low Risk  (09/06/2022)  Physical Activity: Sufficiently Active (09/06/2022)  Social Connections: Moderately Isolated (04/12/2024)  Stress: No Stress Concern Present (09/06/2022)  Tobacco Use: Low Risk  (04/12/2024)   SDOH Interventions: Food Insecurity Interventions: Intervention Not Indicated Housing Interventions: Intervention Not Indicated Transportation Interventions: Intervention Not Indicated Utilities Interventions: Intervention Not Indicated Social Connections Interventions: Intervention Not Indicated   Readmission Risk Interventions    12/28/2023    5:41 PM 09/15/2023    3:04 PM 04/08/2023    2:11 PM  Readmission Risk Prevention Plan  Transportation Screening Complete Complete Complete  PCP or Specialist Appt within 3-5 Days   Not Complete  HRI or Home Care Consult  Complete Complete  Social Work Consult for Recovery Care Planning/Counseling  Complete Complete  Palliative Care Screening  Not Applicable Not Applicable  Medication Review Oceanographer) Complete Complete Complete  PCP or Specialist appointment within 3-5 days of discharge Complete    HRI or Home Care Consult Complete    SW Recovery Care/Counseling Consult Complete    Palliative Care Screening Not Applicable    Skilled Nursing Facility Not Applicable

## 2024-04-13 NOTE — Telephone Encounter (Signed)
 Forms dropped off from [facility] to be completed. Placed in [employer] on [employer]

## 2024-04-13 NOTE — Progress Notes (Signed)
 Interventional Radiology Brief Note  Correction to patients home meds, she is not on Plavix  at this time, only aspirin  81mg .  Continue aspirin  alone for now per Dr. Jennefer.   Shaymus Eveleth PA-C

## 2024-04-14 ENCOUNTER — Other Ambulatory Visit

## 2024-04-14 ENCOUNTER — Encounter (INDEPENDENT_AMBULATORY_CARE_PROVIDER_SITE_OTHER): Payer: Self-pay | Admitting: Gastroenterology

## 2024-04-14 DIAGNOSIS — K559 Vascular disorder of intestine, unspecified: Secondary | ICD-10-CM | POA: Diagnosis not present

## 2024-04-14 LAB — GLUCOSE, CAPILLARY
Glucose-Capillary: 118 mg/dL — ABNORMAL HIGH (ref 70–99)
Glucose-Capillary: 120 mg/dL — ABNORMAL HIGH (ref 70–99)
Glucose-Capillary: 133 mg/dL — ABNORMAL HIGH (ref 70–99)
Glucose-Capillary: 150 mg/dL — ABNORMAL HIGH (ref 70–99)
Glucose-Capillary: 171 mg/dL — ABNORMAL HIGH (ref 70–99)
Glucose-Capillary: 98 mg/dL (ref 70–99)

## 2024-04-14 MED ORDER — SENNA 8.6 MG PO TABS
2.0000 | ORAL_TABLET | Freq: Every evening | ORAL | Status: DC | PRN
  Administered 2024-04-14 – 2024-04-17 (×2): 17.2 mg via ORAL
  Filled 2024-04-14 (×2): qty 2

## 2024-04-14 MED ORDER — BISACODYL 10 MG RE SUPP
10.0000 mg | Freq: Every day | RECTAL | Status: DC | PRN
  Administered 2024-04-14 – 2024-04-19 (×2): 10 mg via RECTAL
  Filled 2024-04-14 (×2): qty 1

## 2024-04-14 MED ORDER — POLYETHYLENE GLYCOL 3350 17 G PO PACK
17.0000 g | PACK | Freq: Every day | ORAL | Status: DC
  Administered 2024-04-14 – 2024-04-20 (×5): 17 g via ORAL
  Filled 2024-04-14 (×7): qty 1

## 2024-04-14 MED ORDER — SMOG ENEMA
960.0000 mL | Freq: Once | RECTAL | Status: DC
  Filled 2024-04-14: qty 960

## 2024-04-14 NOTE — Plan of Care (Signed)
  Problem: Education: Goal: Knowledge of General Education information will improve Description: Including pain rating scale, medication(s)/side effects and non-pharmacologic comfort measures Outcome: Progressing   Problem: Activity: Goal: Risk for activity intolerance will decrease Outcome: Progressing   Problem: Elimination: Goal: Will not experience complications related to urinary retention Outcome: Progressing   Problem: Pain Managment: Goal: General experience of comfort will improve and/or be controlled Outcome: Progressing   Problem: Skin Integrity: Goal: Risk for impaired skin integrity will decrease Outcome: Progressing

## 2024-04-14 NOTE — Progress Notes (Signed)
 [facility]  PROGRESS NOTE  [name] FMW:[id number] DOB: 09/04/1931 DOA: 04/12/2024 PCP: [name], MD   Brief HPI:    89 y.o. 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  At that time she was seen by IR and vascular surgery and they had deemed that stable celiac artery stenosis is not the cause of her symptoms.  Plan was to follow-up with GI as outpatient. Today patient went for follow-up to IR clinic, she continued to complain of abdominal pain which she describes as burning.  Patient says that she has not not been able to eat for past 1 week.  The only thing she eats is broth, chicken soup or yogurt.  Denies vomiting, has been complaining of nausea.  No diarrhea she did have a small BM today.. IR sent her to the ED for admission and possible stent placement in the celiac artery for celiac artery stenosis.    Assessment/Plan:   Heartburn/abdominal pain -Admitted for possible celiac artery stent placement per IR, consulted IR:  Tentatively celiac artery stent planned for Thursday - If patient continues to have symptoms after celiac artery stent placement, consider GI evaluation - In the past she had EGD which showed Schatzki ring and gastritis - Will continue with Maalox/Mylanta as needed - Continue Protonix  40 mg p.o. twice daily - Patient is currently on aspirin  81 mg p.o. daily  Diabetes mellitus type 2 - Metformin  on hold, continue sliding scale insulin  NovoLog  - CBG well-controlled  Hypertension - Blood pressure well-controlled - Continue amlodipine   Generalized anxiety disorder - Continue Xanax        Medications     ALPRAZolam   0.25 mg Oral QHS   amLODipine    10 mg Oral Daily   aspirin   81 mg Oral Daily   cycloSPORINE   1 drop Both Eyes BID   enoxaparin  (LOVENOX ) injection  40 mg Subcutaneous Q24H   gabapentin   100 mg Oral Daily   insulin  aspart  0-9 Units Subcutaneous TID WC   pantoprazole   40 mg Oral BID AC   polyethylene glycol  17 g Oral Daily   tamsulosin   0.4 mg Oral Daily     Data Reviewed:   CBG:  Recent Labs  Lab 04/13/24 1203 04/13/24 1815 04/14/24 0043 04/14/24 0407 04/14/24 0852  GLUCAP 126* 131* 133* 118* 171*    SpO2: 99 %    Vitals:   04/13/24 1708 04/13/24 2027 04/14/24 0408 04/14/24 0925  BP: (!) 176/55 (!) 137/55 (!) 145/78 (!) 151/52  Pulse: 71 68 62 (!) 57  Resp: 18 18 17 18   Temp: 98.1 F (36.7 C) 98 F (36.7 C) 97.8 F (36.6 C) 97.7 F (36.5 C)  TempSrc: Oral Oral Oral Oral  SpO2: 94% 96% 96% 99%  Weight:      Height:          Data Reviewed:  Basic Metabolic Panel: Recent Labs  Lab 04/12/24 1120 04/13/24 0416  NA 137 137  K 3.8 4.2  CL 101 100  CO2 23 28  GLUCOSE 133* 112*  BUN 15 20  CREATININE 1.02* 1.07*  CALCIUM 9.4 8.8*    CBC: Recent Labs  Lab 04/12/24 1120 04/13/24 0416  WBC 7.1 5.8  HGB 11.4* 10.3*  HCT 35.3* 32.0*  MCV 84.9 86.5  PLT 322 264    LFT Recent Labs  Lab 04/12/24 1120 04/13/24 0416  AST 17 19  ALT 11 8  ALKPHOS 72 57  BILITOT 1.1 1.3*  PROT 6.6 5.5*  ALBUMIN 3.8 3.0*     Antibiotics: Anti-infectives (From admission, onward)    None        DVT prophylaxis: Lovenox   Code Status: DNR  Family Communication: No family at bedside   CONSULTS  -IR   Subjective   Having burning pains in her lower abdomen   Objective    Physical Examination:   General: Appearance:     Overweight female in no acute distress     Lungs:      respirations unlabored  Heart:    Bradycardic. Normal rhythm. No murmurs, rubs, or gallops.   MS:   All extremities are intact.   Neurologic:   Awake, alert    Status is: Inpatient:     [name] Bowl   [facility] If 7PM-7AM, please contact night-coverage at [email or web address], Office  [phone]   04/14/2024, 11:52 AM  LOS: 1 day

## 2024-04-14 NOTE — Progress Notes (Signed)
 Pt had concern about been constipated. Notified the MD, new orders place. Pt receive bowel regimen, no results. Notified the MD again, new orders place. Pt receive suppository and final had results. No enema needed at this time but did endorse to ongoing RN that if the pt needed anything else she does have the SMOG in the med room.

## 2024-04-14 NOTE — Progress Notes (Addendum)
 Interventional Radiology Brief Note:  Follow-up with patient today in preparation for celiac artery stenting tomorrow (10/16) with Dr. Jennefer.  She remains on aspirin .  States her belly pain is unbearable at times, then null at others.  She requires IV pain medication for the severity of her pain and is concerned that she may not be able to maintain comfort on PO pain meds.  She is hopeful for relief with procedure tomorrow and states, as she has in the past, that relief/comfort is her only goal.   Addressed DNR status during procedure and she would like to maintain the DNR at all times.    Orders have been updated for procedure tomorrow to include a pre-procedure lactic acid.   Arissa Fagin, MS RD PA-C 1:41 PM

## 2024-04-14 NOTE — Plan of Care (Signed)

## 2024-04-14 NOTE — Telephone Encounter (Signed)
 Forms were filled out and forwarded back

## 2024-04-15 ENCOUNTER — Inpatient Hospital Stay (HOSPITAL_COMMUNITY)

## 2024-04-15 DIAGNOSIS — K559 Vascular disorder of intestine, unspecified: Secondary | ICD-10-CM | POA: Diagnosis not present

## 2024-04-15 HISTORY — PX: IR US GUIDE VASC ACCESS LEFT: IMG2389

## 2024-04-15 HISTORY — PX: IR US GUIDE BX ASP/DRAIN: IMG2392

## 2024-04-15 HISTORY — PX: IR ANGIOGRAM SELECTIVE EACH ADDITIONAL VESSEL: IMG667

## 2024-04-15 HISTORY — PX: IR TRANSCATH PLC STENT 1ST ART NOT LE CV CAR VERT CAR: IMG5443

## 2024-04-15 LAB — CBC
HCT: 33.7 % — ABNORMAL LOW (ref 36.0–46.0)
Hemoglobin: 10.7 g/dL — ABNORMAL LOW (ref 12.0–15.0)
MCH: 27.4 pg (ref 26.0–34.0)
MCHC: 31.8 g/dL (ref 30.0–36.0)
MCV: 86.4 fL (ref 80.0–100.0)
Platelets: 254 K/uL (ref 150–400)
RBC: 3.9 MIL/uL (ref 3.87–5.11)
RDW: 14.6 % (ref 11.5–15.5)
WBC: 5.2 K/uL (ref 4.0–10.5)
nRBC: 0 % (ref 0.0–0.2)

## 2024-04-15 LAB — GLUCOSE, CAPILLARY
Glucose-Capillary: 130 mg/dL — ABNORMAL HIGH (ref 70–99)
Glucose-Capillary: 109 mg/dL — ABNORMAL HIGH (ref 70–99)
Glucose-Capillary: 103 mg/dL — ABNORMAL HIGH (ref 70–99)
Glucose-Capillary: 151 mg/dL — ABNORMAL HIGH (ref 70–99)

## 2024-04-15 LAB — LACTIC ACID, PLASMA: Lactic Acid, Venous: 1.2 mmol/L (ref 0.5–1.9)

## 2024-04-15 LAB — BASIC METABOLIC PANEL WITH GFR
Anion gap: 9 (ref 5–15)
BUN: 18 mg/dL (ref 8–23)
CO2: 25 mmol/L (ref 22–32)
Calcium: 8.8 mg/dL — ABNORMAL LOW (ref 8.9–10.3)
Chloride: 100 mmol/L (ref 98–111)
Creatinine, Ser: 1 mg/dL (ref 0.44–1.00)
GFR, Estimated: 54 mL/min — ABNORMAL LOW (ref >60)
Glucose, Bld: 116 mg/dL — ABNORMAL HIGH (ref 70–99)
Potassium: 3.9 mmol/L (ref 3.5–5.1)
Sodium: 134 mmol/L — ABNORMAL LOW (ref 135–145)

## 2024-04-15 MED ORDER — LIDOCAINE-EPINEPHRINE 1 %-1:100000 IJ SOLN
INTRAMUSCULAR | Status: AC
Start: 2024-04-15 — End: 2024-04-15
  Filled 2024-04-15: qty 1

## 2024-04-15 MED ORDER — FENTANYL CITRATE (PF) 100 MCG/2ML IJ SOLN
INTRAMUSCULAR | Status: AC | PRN
  Administered 2024-04-15: 50 ug via INTRAVENOUS
  Administered 2024-04-15 (×2): 25 ug via INTRAVENOUS

## 2024-04-15 MED ORDER — THROMBIN FOR PERCUTANEOUS TREATMENT OF PSEUDOANEURYSM (5000UNITS/10ML)
Freq: Once | PERCUTANEOUS | Status: AC
  Filled 2024-04-15: qty 1

## 2024-04-15 MED ORDER — IODIXANOL 320 MG/ML IV SOLN
150.0000 mL | Freq: Once | INTRAVENOUS | Status: AC | PRN
  Administered 2024-04-15: 50 mL via INTRAVENOUS

## 2024-04-15 MED ORDER — MIDAZOLAM HCL 2 MG/2ML IJ SOLN
INTRAMUSCULAR | Status: AC
  Filled 2024-04-15: qty 2

## 2024-04-15 MED ORDER — FENTANYL CITRATE (PF) 100 MCG/2ML IJ SOLN
INTRAMUSCULAR | Status: AC
  Filled 2024-04-15: qty 2

## 2024-04-15 MED ORDER — MIDAZOLAM HCL (PF) 2 MG/2ML IJ SOLN
INTRAMUSCULAR | Status: AC | PRN
  Administered 2024-04-15 (×2): 1 mg via INTRAVENOUS

## 2024-04-15 MED ORDER — LIDOCAINE-EPINEPHRINE 1 %-1:100000 IJ SOLN
20.0000 mL | Freq: Once | INTRAMUSCULAR | Status: AC
  Administered 2024-04-15: 8 mL via INTRADERMAL
  Filled 2024-04-15: qty 20

## 2024-04-15 NOTE — Progress Notes (Signed)
 [facility]  PROGRESS NOTE  [name] FMW:[id number] DOB: [birth year] DOA: 04/12/2024 PCP: [name], MD   Brief HPI:    89 y.o. 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  At that time she was seen by IR and vascular surgery and they had deemed that stable celiac artery stenosis is not the cause of her symptoms.  Plan was to follow-up with GI as outpatient. Today patient went for follow-up to IR clinic, she continued to complain of abdominal pain which she describes as burning.  Patient says that she has not not been able to eat for past 1 week.  The only thing she eats is broth, chicken soup or yogurt.  Denies vomiting, has been complaining of nausea.  No diarrhea she did have a small BM today.. IR sent her to the ED for admission and possible stent placement in the celiac artery for celiac artery stenosis.    Assessment/Plan:   Heartburn/abdominal pain -Admitted for possible celiac artery stent placement per IR, consulted IR:  Tentatively celiac artery stent planned for Thursday - If patient continues to have symptoms after celiac artery stent placement, consider GI evaluation--- In the past she had EGD which showed Schatzki ring and gastritis - Will continue with Maalox/Mylanta as needed - Continue Protonix  40 mg p.o. twice daily - Patient is currently on aspirin  81 mg p.o. daily  Diabetes mellitus type 2 - Metformin  on hold, continue sliding scale insulin  NovoLog  - CBG well-controlled  Hypertension - Resume blood pressure meds as needed  Generalized anxiety disorder - Continue Xanax        Medications     ALPRAZolam   0.25 mg Oral QHS   aspirin   81 mg Oral Daily    cycloSPORINE   1 drop Both Eyes BID   enoxaparin  (LOVENOX ) injection  40 mg Subcutaneous Q24H   gabapentin   100 mg Oral Daily   insulin  aspart  0-9 Units Subcutaneous TID WC   pantoprazole   40 mg Oral BID AC   polyethylene glycol  17 g Oral Daily   SMOG  960 mL Rectal Once   tamsulosin   0.4 mg Oral Daily     Data Reviewed:   CBG:  Recent Labs  Lab 04/14/24 1210 04/14/24 1709 04/14/24 2111 04/15/24 0744 04/15/24 1250  GLUCAP 98 120* 150* 130* 109*    SpO2: 100 % O2 Flow Rate (L/min): 2 L/min    Vitals:   04/15/24 1230 04/15/24 1245 04/15/24 1300 04/15/24 1315  BP: (!) 163/49 (!) 162/52 (!) 162/55 (!) 169/53  Pulse: 60 (!) 58 60 64  Resp:      Temp:      TempSrc:      SpO2: 92% 100% 100% 100%  Weight:      Height:          Data Reviewed:  Basic Metabolic Panel: Recent Labs  Lab 04/12/24 1120 04/13/24 0416 04/15/24 0320  NA 137 137 134*  K 3.8 4.2 3.9  CL 101 100 100  CO2 23 28 25   GLUCOSE 133* 112* 116*  BUN 15 20 18   CREATININE 1.02* 1.07* 1.00  CALCIUM 9.4 8.8* 8.8*    CBC: Recent Labs  Lab 04/12/24 1120 04/13/24 0416 04/15/24 0320  WBC 7.1 5.8  5.2  HGB 11.4* 10.3* 10.7*  HCT 35.3* 32.0* 33.7*  MCV 84.9 86.5 86.4  PLT 322 264 254    LFT Recent Labs  Lab 04/12/24 1120 04/13/24 0416  AST 17 19  ALT 11 8  ALKPHOS 72 57  BILITOT 1.1 1.3*  PROT 6.6 5.5*  ALBUMIN 3.8 3.0*     Antibiotics: Anti-infectives (From admission, onward)    None        DVT prophylaxis: Lovenox   Code Status: DNR  Family Communication: No family at bedside   CONSULTS  -IR   Subjective   Had successful bowel movements after suppository   Objective    Physical Examination:    General: Appearance:     Overweight female in no acute distress     Lungs:     respirations unlabored  Heart:    Normal heart rate.   MS:   All extremities are intact.   Neurologic:   Awake, alert     Status is: Inpatient:     [name] Bowl   [facility] If 7PM-7AM, please contact night-coverage at [email or web address], Office  [phone]   04/15/2024, 1:23 PM  LOS: 2 days

## 2024-04-15 NOTE — Procedures (Addendum)
 Interventional Radiology Procedure Note  Procedure:  1) Celiac artery stent placement 2) Right CFA pseudoaneurysm thrombin injection   Findings: Please refer to procedural dictation for full description. Left CFA 7 Fr access, closed with 8 Fr Angioseal.    Complications: None immediate  Estimated Blood Loss: < 5 mL  Recommendations: 4 hours strict bedrest. Continue Aspirin . IR will follow.   Ester Sides, MD

## 2024-04-15 NOTE — Sedation Documentation (Signed)
 Left fem sheath removed, 8 fr angioseal deployed by Dr. Jennefer.

## 2024-04-15 NOTE — Plan of Care (Signed)
  Problem: Education: Goal: Knowledge of General Education information will improve Description: Including pain rating scale, medication(s)/side effects and non-pharmacologic comfort measures Outcome: Progressing   Problem: Health Behavior/Discharge Planning: Goal: Ability to manage health-related needs will improve Outcome: Progressing   Problem: Activity: Goal: Risk for activity intolerance will decrease Outcome: Progressing   Problem: Nutrition: Goal: Adequate nutrition will be maintained Outcome: Progressing   Problem: Coping: Goal: Level of anxiety will decrease Outcome: Progressing   Problem: Elimination: Goal: Will not experience complications related to bowel motility Outcome: Progressing Goal: Will not experience complications related to urinary retention Outcome: Progressing   Problem: Pain Managment: Goal: General experience of comfort will improve and/or be controlled Outcome: Progressing

## 2024-04-16 ENCOUNTER — Ambulatory Visit: Admitting: Urology

## 2024-04-16 ENCOUNTER — Other Ambulatory Visit: Payer: Self-pay

## 2024-04-16 ENCOUNTER — Inpatient Hospital Stay (HOSPITAL_COMMUNITY)

## 2024-04-16 DIAGNOSIS — K8020 Calculus of gallbladder without cholecystitis without obstruction: Secondary | ICD-10-CM | POA: Diagnosis not present

## 2024-04-16 DIAGNOSIS — K559 Vascular disorder of intestine, unspecified: Secondary | ICD-10-CM | POA: Diagnosis not present

## 2024-04-16 DIAGNOSIS — I724 Aneurysm of artery of lower extremity: Secondary | ICD-10-CM

## 2024-04-16 DIAGNOSIS — R109 Unspecified abdominal pain: Secondary | ICD-10-CM | POA: Diagnosis not present

## 2024-04-16 LAB — HEMOGLOBIN AND HEMATOCRIT, BLOOD
HCT: 34 % — ABNORMAL LOW (ref 36.0–46.0)
HCT: 34.8 % — ABNORMAL LOW (ref 36.0–46.0)
Hemoglobin: 10.9 g/dL — ABNORMAL LOW (ref 12.0–15.0)
Hemoglobin: 11 g/dL — ABNORMAL LOW (ref 12.0–15.0)

## 2024-04-16 LAB — CBC
HCT: 32.7 % — ABNORMAL LOW (ref 36.0–46.0)
Hemoglobin: 10.5 g/dL — ABNORMAL LOW (ref 12.0–15.0)
MCH: 27.6 pg (ref 26.0–34.0)
MCHC: 32.1 g/dL (ref 30.0–36.0)
MCV: 86.1 fL (ref 80.0–100.0)
Platelets: 248 K/uL (ref 150–400)
RBC: 3.8 MIL/uL — ABNORMAL LOW (ref 3.87–5.11)
RDW: 14.9 % (ref 11.5–15.5)
WBC: 7.4 K/uL (ref 4.0–10.5)
nRBC: 0 % (ref 0.0–0.2)

## 2024-04-16 LAB — BASIC METABOLIC PANEL WITH GFR
Anion gap: 11 (ref 5–15)
BUN: 16 mg/dL (ref 8–23)
CO2: 26 mmol/L (ref 22–32)
Calcium: 8.8 mg/dL — ABNORMAL LOW (ref 8.9–10.3)
Chloride: 99 mmol/L (ref 98–111)
Creatinine, Ser: 0.97 mg/dL (ref 0.44–1.00)
GFR, Estimated: 56 mL/min — ABNORMAL LOW (ref >60)
Glucose, Bld: 141 mg/dL — ABNORMAL HIGH (ref 70–99)
Potassium: 3.6 mmol/L (ref 3.5–5.1)
Sodium: 136 mmol/L (ref 135–145)

## 2024-04-16 LAB — LACTIC ACID, PLASMA: Lactic Acid, Venous: 1.7 mmol/L (ref 0.5–1.9)

## 2024-04-16 LAB — GLUCOSE, CAPILLARY
Glucose-Capillary: 120 mg/dL — ABNORMAL HIGH (ref 70–99)
Glucose-Capillary: 142 mg/dL — ABNORMAL HIGH (ref 70–99)
Glucose-Capillary: 126 mg/dL — ABNORMAL HIGH (ref 70–99)
Glucose-Capillary: 136 mg/dL — ABNORMAL HIGH (ref 70–99)

## 2024-04-16 LAB — LIPASE, BLOOD: Lipase: 22 U/L (ref 11–51)

## 2024-04-16 MED ORDER — AMLODIPINE BESYLATE 10 MG PO TABS
10.0000 mg | ORAL_TABLET | Freq: Every day | ORAL | Status: DC
  Administered 2024-04-16 – 2024-04-22 (×7): 10 mg via ORAL
  Filled 2024-04-16 (×7): qty 1

## 2024-04-16 MED ORDER — LIDOCAINE 5 % EX PTCH
1.0000 | MEDICATED_PATCH | CUTANEOUS | Status: DC
  Administered 2024-04-16 – 2024-04-22 (×7): 1 via TRANSDERMAL
  Filled 2024-04-16 (×7): qty 1

## 2024-04-16 MED ORDER — ENSURE PLUS HIGH PROTEIN PO LIQD
237.0000 mL | Freq: Two times a day (BID) | ORAL | Status: DC
  Administered 2024-04-16 – 2024-04-22 (×11): 237 mL via ORAL

## 2024-04-16 MED ORDER — IOHEXOL 350 MG/ML SOLN
100.0000 mL | Freq: Once | INTRAVENOUS | Status: AC | PRN
  Administered 2024-04-16: 100 mL via INTRAVENOUS

## 2024-04-16 MED ORDER — MORPHINE SULFATE (PF) 2 MG/ML IV SOLN: 2.0000 mg | INTRAVENOUS | Status: DC | PRN

## 2024-04-16 MED ORDER — HYDROMORPHONE HCL 1 MG/ML IJ SOLN
0.5000 mg | INTRAMUSCULAR | Status: AC
  Administered 2024-04-16: 0.5 mg via INTRAVENOUS
  Filled 2024-04-16: qty 0.5

## 2024-04-16 MED ORDER — HYDROMORPHONE HCL 1 MG/ML IJ SOLN
0.5000 mg | INTRAMUSCULAR | Status: DC | PRN
  Administered 2024-04-16 – 2024-04-22 (×19): 0.5 mg via INTRAVENOUS
  Filled 2024-04-16 (×20): qty 0.5

## 2024-04-16 NOTE — Progress Notes (Signed)
 Mobility Specialist Progress Note:    04/16/24 1147  Mobility  Activity Ambulated with assistance  Level of Assistance Minimal assist, patient does 75% or more  Assistive Device Front wheel walker  Distance Ambulated (ft) 300 ft  Activity Response Tolerated well  Mobility Referral Yes  Mobility visit 1 Mobility  Mobility Specialist Start Time (ACUTE ONLY) 1136  Mobility Specialist Stop Time (ACUTE ONLY) 1147  Mobility Specialist Time Calculation (min) (ACUTE ONLY) 11 min   Pt received in bed, agreeable to ambulate in hallways. Tolerated well.  MinA to sit EOB, CGA during session. Returned pt to room, sitting up at EOB eating lunch with all needs met, call bell in reach.   Madason Rauls Mobility Specialist Please contact via Special educational needs teacher or  Rehab office at [phone]

## 2024-04-16 NOTE — Progress Notes (Signed)
 Referring Physician(s): Dr JINNY Bowl Dr GORMAN Fielding   Supervising Physician: Jenna Hacker  Patient Status:  [facility] - In-pt  Chief Complaint:  Abd pain  Subjective:  Known to IR from prior superior mesenteric artery stent for mesenteric ischemia  Continued abd pain- worsening  Procedure: 10/16 in IR Dr Jennefer 1) Celiac artery stent placement 2) Right CFA pseudoaneurysm thrombin injection   Allergies: Statins, Phenergan [promethazine hcl], Hydrocortisone, Lisinopril, Prednisone , and Trazodone  and nefazodone  Medications: Prior to Admission medications   Medication Sig Start Date End Date Taking? Authorizing Provider  acetaminophen  (TYLENOL ) 325 MG tablet TAKE (2) TABLETS BY MOUTH EVERY SIX HOURS AS NEEDED FOR PAIN. MAX 3GM IN 24 HOURS. 07/28/23  Yes Luking, Glendia LABOR, MD  albuterol  (VENTOLIN  HFA) 108 (90 Base) MCG/ACT inhaler INHALE 2 PUFFS INTO THE LUNGS EVERY SIX HOURS AS NEEDED FOR WHEEZING OR SHORTNESS OF BREATH. 09/26/23  Yes Luking, Glendia LABOR, MD  ALPRAZolam  (XANAX ) 0.25 MG tablet Take 1 tablet (0.25 mg total) by mouth at bedtime. 04/02/24  Yes Danford, Lonni SQUIBB, MD  amLODipine  (NORVASC ) 10 MG tablet TAKE (1) TABLET BY MOUTH ONCE DAILY. 10/07/23  Yes Luking, Glendia LABOR, MD  aspirin  (ASPIRIN  LOW DOSE) 81 MG chewable tablet CHEW (1) TABLET BY MOUTH ONCE DAILY. 09/06/23  Yes Fielding Glendia LABOR, MD  BETA CAROTENE PROVITAMIN A 25000 units capsule TAKE (1) CAPSULE BY MOUTH AT BEDTIME. 02/24/24  Yes Luking, Scott A, MD  BISACODYL  5 MG EC tablet TAKE 1 TABLET BY MOUTH ONCE DAILY AS NEEDED FOR MODERATE CONSTIPATION. 12/20/22  Yes Fielding Glendia LABOR, MD  cyanocobalamin  1000 MCG tablet Take 1,000 mcg by mouth at bedtime.   Yes [provider]  cycloSPORINE  (RESTASIS ) 0.05 % ophthalmic emulsion Place 1 drop into both eyes 2 (two) times daily.   Yes [provider]  estradiol  (ESTRACE ) 0.1 MG/GM vaginal cream Discard plastic applicator. Insert a blueberry size amount (approximately 1  gram) of cream on fingertip inside vagina at bedtime every night for 1 week then every other night. For long term use. 01/15/24  Yes Larocco, Lauraine BROCKS, FNP  gabapentin  (NEURONTIN ) 100 MG capsule 1 qam for neuropath- new direction Patient taking differently: Take 100 mg by mouth at bedtime. 04/05/24  Yes Fielding Glendia LABOR, MD  hydrALAZINE  (APRESOLINE ) 25 MG tablet Take 1 tablet (25 mg total) by mouth 3 (three) times daily. 12/31/23  Yes Luking, Glendia LABOR, MD  IVIZIA DRY EYES 0.5 % SOLN Place 1 drop into both eyes 2 (two) times daily. 09/22/23  Yes [provider]  Melatonin 10 MG TABS Take 10 mg by mouth at bedtime. 03/24/24  Yes Grooms, Courtney, PA-C  metFORMIN  (GLUCOPHAGE ) 500 MG tablet TAKE (1) TABLET BY MOUTH IN THE MORNING & (1/2) TABLET (250MG ) BY MOUTH AT SUPPER 10/07/23  Yes Luking, Glendia LABOR, MD  methenamine  (HIPREX ) 1 g tablet Take 1 tablet (1 g total) by mouth 2 (two) times daily with a meal. Most effective when taken with a daily Vitamin C supplement. 01/15/24  Yes Gerldine Lauraine BROCKS, FNP  Multiple Vitamins-Minerals (PRESERVISION AREDS 2) CAPS Take 1 capsule by mouth in the morning and at bedtime.   Yes [provider]  mupirocin  ointment (BACTROBAN ) 2 % Apply thin amount twice daily to rash in groin creases twice daily for 5 days at a time when rash occurs 03/07/24  Yes Luking, Scott A, MD  ondansetron  (ZOFRAN -ODT) 4 MG disintegrating tablet 4mg  ODT q4 hours prn nausea/vomit Patient taking differently: Take 4  mg by mouth every 4 (four) hours as needed for nausea, vomiting or refractory nausea / vomiting. 09/05/23  Yes Zammit, Joseph, MD  oxyCODONE -acetaminophen  (PERCOCET/ROXICET) 5-325 MG tablet Take 1 tablet by mouth every 4 (four) hours as needed for moderate pain (pain score 4-6) or severe pain (pain score 7-10). 04/02/24  Yes Danford, Lonni SQUIBB, MD  pantoprazole  (PROTONIX ) 40 MG tablet Take 1 tablet (40 mg total) by mouth 2 (two) times daily before a meal. 12/24/23  Yes Cook, Jayce G, DO   polyethylene glycol powder (GOODSENSE CLEARLAX) 17 GM/SCOOP powder 1 scoop in 8 ounces water  once daily as needed for constipation 06/03/23  Yes Luking, Scott A, MD  senna-docusate (SENOKOT-S) 8.6-50 MG tablet Take 1 tablet by mouth at bedtime. 04/02/24  Yes Danford, Lonni SQUIBB, MD  tamsulosin  (FLOMAX ) 0.4 MG CAPS capsule Take 1 capsule (0.4 mg total) by mouth daily. 03/08/24  Yes Dahlstedt, Garnette, MD  valsartan  (DIOVAN ) 320 MG tablet Take 1 tablet (320 mg total) by mouth daily. 12/22/23  Yes Luking, Glendia LABOR, MD  Vitamin D , Cholecalciferol , 10 MCG (400 UNIT) TABS TAKE (1) TABLET BY MOUTH ONCE DAILY. 10/07/23  Yes Luking, Scott A, MD  glucose blood (EASYMAX TEST) test strip CHECK BLOOD SUGAR ONCE DAILY.(CALL MD IF BS BELOW 60: OR IF BS ABOVE 400) 10/07/23   Luking, Glendia LABOR, MD  ketoconazole  (NIZORAL ) 2 % cream Apply 1 Application topically 2 (two) times daily. For 10 days to groin rash Patient not taking: Reported on 04/13/2024 03/03/24   [name], MD  METAMUCIL 4 IN 1 FIBER 25 % PACK Take by mouth 2 (two) times daily. Patient not taking: Reported on 04/13/2024 03/25/24   [provider]  Safety Lancets 28G MISC CHECK BLOOD SUGAR ONCE DAILY. 01/21/24   [name], MD  sodium chloride  1 g tablet TAKE (1) TABLET BY MOUTH TWICE DAILY WITH A MEAL. Patient not taking: Reported on 04/13/2024 01/26/24   [name], MD     Vital Signs: BP (!) 180/64 (BP Location: Right Arm)   Pulse 70   Temp 98.5 F (36.9 C) (Oral)   Resp 18   Ht 5' 8 (1.727 m)   Wt 166 lb (75.3 kg)   SpO2 100%   BMI 25.24 kg/m   Physical Exam Abdominal:     Palpations: Abdomen is soft.     Tenderness: There is no abdominal tenderness.  Skin:    Comments: Left groin site is clean and dry NT no bleeding No hematoma L foot good pulses  Pt was up walking in hall- without pain     Imaging: IR US  Guide Vasc Access Left Result Date: 04/15/2024 INDICATION: 89 year old female with history of chronic  mesenteric ischemia with recurrent abdominal pain a proximally 1 year after SMA stent placement. 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 EXAM: 1. Ultrasound-guided vascular access of the left common femoral artery. 2. Abdominal aortogram. 3. Selective catheterization and angiography of the celiac artery. 4. Celiac artery stent placement. 5. Ultrasound-guided percutaneous thrombin injection to right groin pseudoaneurysm. MEDICATIONS: Ancef  2 gm IV. The antibiotic was administered within 1 hour of the procedure ANESTHESIA/SEDATION: Moderate (conscious) sedation was employed during this procedure. A total of Versed  2 mg and Fentanyl  75 mcg was administered intravenously. Moderate Sedation Time: 40 minutes. The patient's level of consciousness and vital signs were monitored continuously by radiology nursing throughout the procedure under my direct supervision. CONTRAST:  50mL VISIPAQUE IODIXANOL 320 MG/ML IV  SOLN FLUOROSCOPY: Radiation Exposure Index (as provided by the fluoroscopic device): 610 mGy Kerma COMPLICATIONS: None immediate. PROCEDURE: Informed consent was obtained from the patient following explanation of the procedure, risks, benefits and alternatives. The patient understands, agrees and consents for the procedure. All questions were addressed. A time out was performed prior to the initiation of the procedure. Maximal barrier sterile technique utilized including caps, mask, sterile gowns, sterile gloves, large sterile drape, hand hygiene, and Betadine  prep. Preprocedure ultrasound evaluation of the left common femoral artery demonstrated patency. The procedure was planned. Subdermal Local anesthesia was administered with 1% lidocaine . A small skin nick was made. Under direct ultrasound visualization, a 21 gauge micropuncture needle was directed to the left common femoral artery. A permanent ultrasound image was captured and stored in the record. A  micropuncture sheath was placed and limited left lower extremity angiogram was performed which demonstrated adequate puncture site for closure device use. A Wholey wire was then inserted over which the micropuncture sheath was exchanged for a 5 [place] vascular sheath. An Omni Flush catheter was inserted and positioned at the proximal abdominal aorta. Abdominal aortogram in AP and lateral projections demonstrated patency of the abdominal aorta with scattered circumferential atherosclerotic calcifications. The SMA indwelling stent and vasculature was widely patent. Patent bilateral renal arteries. 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 The 5 French sheath was then exchanged over a wire for a 7 [place] Tourguide sheath which was positioned the celiac ostium and through which a Navicross catheter was placed which was used to select the celiac artery. Angiogram was performed which demonstrated patency of the distal celiac artery and its branches. The wire was exchanged for a Rosen wire which was positioned in the proximal splenic artery. Next, a 8 mm by 29 mm VBX stent was placed under fluoroscopic visualization. Post balloon molding about the ostial aspect of the stent was performed with a 9 mm x 4 cm mustang balloon. Completion angiogram from the abdominal aorta demonstrated a well-positioned, widely patent celiac stent and widely patent indwelling SMA stent. The sheath was removed and exchanged for an 8 French Angio-Seal device which was deployed successfully. Under direct ultrasound visualization, the right groin pseudoaneurysm was identified to be arising from the common femoral artery. 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 Under ultrasound visualization, a 25 gauge spinal needle was directed into the patent portion. The inner stylet was removed and there was arterial blood flow return. Under direct ultrasound visualization, a total of approximately  650 units thrombin were injected until color flow within the aneurysm resolved. The common femoral artery and vein patent. The needle was removed and a bandage was applied. Peripheral pulses were unchanged. The patient tolerated the procedure well was transferred back to the floor in good condition. IMPRESSION: 1. Technically successful celiac ostial stent placement. 2. Technically successful percutaneous thrombin injection to treat right common femoral artery pseudoaneurysm. Ester Sides, MD [facility] Radiology Electronically Signed   By: Ester Sides M.D.   On: 04/15/2024 16:12   IR US  Guide Bx Asp/Drain Result Date: 04/15/2024 INDICATION: 89 year old female with history of chronic mesenteric ischemia with recurrent abdominal pain a proximally 1 year after SMA stent placement. 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 EXAM: 1. Ultrasound-guided vascular access of the left common femoral artery. 2. Abdominal aortogram. 3. Selective catheterization and angiography of the celiac artery. 4. Celiac artery stent placement. 5. Ultrasound-guided percutaneous  thrombin injection to right groin pseudoaneurysm. MEDICATIONS: Ancef  2 gm IV. The antibiotic was administered within 1 hour of the procedure ANESTHESIA/SEDATION: Moderate (conscious) sedation was employed during this procedure. A total of Versed  2 mg and Fentanyl  75 mcg was administered intravenously. Moderate Sedation Time: 40 minutes. The patient's level of consciousness and vital signs were monitored continuously by radiology nursing throughout the procedure under my direct supervision. CONTRAST:  50mL VISIPAQUE IODIXANOL 320 MG/ML IV SOLN FLUOROSCOPY: Radiation Exposure Index (as provided by the fluoroscopic device): 610 mGy Kerma COMPLICATIONS: None immediate. PROCEDURE: Informed consent was obtained from the patient following explanation of the  procedure, risks, benefits and alternatives. The patient understands, agrees and consents for the procedure. All questions were addressed. A time out was performed prior to the initiation of the procedure. Maximal barrier sterile technique utilized including caps, mask, sterile gowns, sterile gloves, large sterile drape, hand hygiene, and Betadine  prep. Preprocedure ultrasound evaluation of the left common femoral artery demonstrated patency. The procedure was planned. Subdermal Local anesthesia was administered with 1% lidocaine . A small skin nick was made. Under direct ultrasound visualization, a 21 gauge micropuncture needle was directed to the left common femoral artery. A permanent ultrasound image was captured and stored in the record. A micropuncture sheath was placed and limited left lower extremity angiogram was performed which demonstrated adequate puncture site for closure device use. A Wholey wire was then inserted over which the micropuncture sheath was exchanged for a 5 [place] vascular sheath. An Omni Flush catheter was inserted and positioned at the proximal abdominal aorta. Abdominal aortogram in AP and lateral projections demonstrated patency of the abdominal aorta with scattered circumferential atherosclerotic calcifications. The SMA indwelling stent and vasculature was widely patent. Patent bilateral renal arteries. 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 The 5 French sheath was then exchanged over a wire for a 7 [place] Tourguide sheath which was positioned the celiac ostium and through which a Navicross catheter was placed which was used to select the celiac artery. Angiogram was performed which demonstrated patency of the distal celiac artery and its branches. The wire was exchanged for a Rosen wire which was positioned in the proximal splenic artery. Next, a 8 mm by 29 mm VBX stent was placed under fluoroscopic visualization. Post balloon molding about the  ostial aspect of the stent was performed with a 9 mm x 4 cm mustang balloon. Completion angiogram from the abdominal aorta demonstrated a well-positioned, widely patent celiac stent and widely patent indwelling SMA stent. The sheath was removed and exchanged for an 8 French Angio-Seal device which was deployed successfully. Under direct ultrasound visualization, the right groin pseudoaneurysm was identified to be arising from the common femoral artery. 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 Under ultrasound visualization, a 25 gauge spinal needle was directed into the patent portion. The inner stylet was removed and there was arterial blood flow return. Under direct ultrasound visualization, a total of approximately 650 units thrombin were injected until color flow within the aneurysm resolved. The common femoral artery and vein patent. The needle was removed and a bandage was applied. Peripheral pulses were unchanged. The patient tolerated the procedure well was transferred back to the floor in good condition. IMPRESSION: 1. Technically successful celiac ostial stent placement. 2. Technically successful percutaneous thrombin injection to treat right common femoral artery pseudoaneurysm. Ester Sides, MD [facility] Radiology Electronically Signed   By: Ester Sides HERO.D.  On: 04/15/2024 16:12   IR Angiogram Selective Each Additional Vessel Result Date: 04/15/2024 INDICATION: 89 year old female with history of chronic mesenteric ischemia with recurrent abdominal pain a proximally 1 year after SMA stent placement. 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 EXAM: 1. Ultrasound-guided vascular access of the left common femoral artery. 2. Abdominal aortogram. 3. Selective catheterization and angiography of the celiac artery. 4. Celiac artery stent placement. 5.  Ultrasound-guided percutaneous thrombin injection to right groin pseudoaneurysm. MEDICATIONS: Ancef  2 gm IV. The antibiotic was administered within 1 hour of the procedure ANESTHESIA/SEDATION: Moderate (conscious) sedation was employed during this procedure. A total of Versed  2 mg and Fentanyl  75 mcg was administered intravenously. Moderate Sedation Time: 40 minutes. The patient's level of consciousness and vital signs were monitored continuously by radiology nursing throughout the procedure under my direct supervision. CONTRAST:  50mL VISIPAQUE IODIXANOL 320 MG/ML IV SOLN FLUOROSCOPY: Radiation Exposure Index (as provided by the fluoroscopic device): 610 mGy Kerma COMPLICATIONS: None immediate. PROCEDURE: Informed consent was obtained from the patient following explanation of the procedure, risks, benefits and alternatives. The patient understands, agrees and consents for the procedure. All questions were addressed. A time out was performed prior to the initiation of the procedure. Maximal barrier sterile technique utilized including caps, mask, sterile gowns, sterile gloves, large sterile drape, hand hygiene, and Betadine  prep. Preprocedure ultrasound evaluation of the left common femoral artery demonstrated patency. The procedure was planned. Subdermal Local anesthesia was administered with 1% lidocaine . A small skin nick was made. Under direct ultrasound visualization, a 21 gauge micropuncture needle was directed to the left common femoral artery. A permanent ultrasound image was captured and stored in the record. A micropuncture sheath was placed and limited left lower extremity angiogram was performed which demonstrated adequate puncture site for closure device use. A Wholey wire was then inserted over which the micropuncture sheath was exchanged for a 5 [place] vascular sheath. An Omni Flush catheter was inserted and positioned at the proximal abdominal aorta. Abdominal aortogram in AP and lateral projections  demonstrated patency of the abdominal aorta with scattered circumferential atherosclerotic calcifications. The SMA indwelling stent and vasculature was widely patent. Patent bilateral renal arteries. 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 The 5 French sheath was then exchanged over a wire for a 7 [place] Tourguide sheath which was positioned the celiac ostium and through which a Navicross catheter was placed which was used to select the celiac artery. Angiogram was performed which demonstrated patency of the distal celiac artery and its branches. The wire was exchanged for a Rosen wire which was positioned in the proximal splenic artery. Next, a 8 mm by 29 mm VBX stent was placed under fluoroscopic visualization. Post balloon molding about the ostial aspect of the stent was performed with a 9 mm x 4 cm mustang balloon. Completion angiogram from the abdominal aorta demonstrated a well-positioned, widely patent celiac stent and widely patent indwelling SMA stent. The sheath was removed and exchanged for an 8 French Angio-Seal device which was deployed successfully. Under direct ultrasound visualization, the right groin pseudoaneurysm was identified to be arising from the common femoral artery. 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 Under ultrasound visualization, a 25 gauge spinal needle was directed into the patent portion. The inner stylet was removed and there was arterial blood flow return. Under direct ultrasound visualization, a total of approximately 650 units thrombin were injected until color flow within the aneurysm  resolved. The common femoral artery and vein patent. The needle was removed and a bandage was applied. Peripheral pulses were unchanged. The patient tolerated the procedure well was transferred back to the floor in good condition. IMPRESSION: 1. Technically successful celiac ostial stent placement. 2. Technically successful  percutaneous thrombin injection to treat right common femoral artery pseudoaneurysm. Ester Sides, MD [facility] Radiology Electronically Signed   By: Ester Sides M.D.   On: 04/15/2024 16:12   IR TRANSCATH PLC STENT 1ST ART NOT LE CV CAR VERT CAR Result Date: 04/15/2024 INDICATION: 89 year old female with history of chronic mesenteric ischemia with recurrent abdominal pain a proximally 1 year after SMA stent placement. 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 EXAM: 1. Ultrasound-guided vascular access of the left common femoral artery. 2. Abdominal aortogram. 3. Selective catheterization and angiography of the celiac artery. 4. Celiac artery stent placement. 5. Ultrasound-guided percutaneous thrombin injection to right groin pseudoaneurysm. MEDICATIONS: Ancef  2 gm IV. The antibiotic was administered within 1 hour of the procedure ANESTHESIA/SEDATION: Moderate (conscious) sedation was employed during this procedure. A total of Versed  2 mg and Fentanyl  75 mcg was administered intravenously. Moderate Sedation Time: 40 minutes. The patient's level of consciousness and vital signs were monitored continuously by radiology nursing throughout the procedure under my direct supervision. CONTRAST:  50mL VISIPAQUE IODIXANOL 320 MG/ML IV SOLN FLUOROSCOPY: Radiation Exposure Index (as provided by the fluoroscopic device): 610 mGy Kerma COMPLICATIONS: None immediate. PROCEDURE: Informed consent was obtained from the patient following explanation of the procedure, risks, benefits and alternatives. The patient understands, agrees and consents for the procedure. All questions were addressed. A time out was performed prior to the initiation of the procedure. Maximal barrier sterile technique utilized including caps, mask, sterile gowns, sterile gloves, large sterile drape, hand hygiene, and Betadine  prep. Preprocedure  ultrasound evaluation of the left common femoral artery demonstrated patency. The procedure was planned. Subdermal Local anesthesia was administered with 1% lidocaine . A small skin nick was made. Under direct ultrasound visualization, a 21 gauge micropuncture needle was directed to the left common femoral artery. A permanent ultrasound image was captured and stored in the record. A micropuncture sheath was placed and limited left lower extremity angiogram was performed which demonstrated adequate puncture site for closure device use. A Wholey wire was then inserted over which the micropuncture sheath was exchanged for a 5 [place] vascular sheath. An Omni Flush catheter was inserted and positioned at the proximal abdominal aorta. Abdominal aortogram in AP and lateral projections demonstrated patency of the abdominal aorta with scattered circumferential atherosclerotic calcifications. The SMA indwelling stent and vasculature was widely patent. Patent bilateral renal arteries. 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 The 5 French sheath was then exchanged over a wire for a 7 [place] Tourguide sheath which was positioned the celiac ostium and through which a Navicross catheter was placed which was used to select the celiac artery. Angiogram was performed which demonstrated patency of the distal celiac artery and its branches. The wire was exchanged for a Rosen wire which was positioned in the proximal splenic artery. Next, a 8 mm by 29 mm VBX stent was placed under fluoroscopic visualization. Post balloon molding about the ostial aspect of the stent was performed with a 9 mm x 4 cm mustang balloon. Completion angiogram from the abdominal aorta demonstrated a well-positioned, widely patent celiac stent and widely patent indwelling SMA stent. The sheath was removed and exchanged for  an 8 [place] Angio-Seal device which was deployed successfully. Under direct ultrasound visualization, the  right groin pseudoaneurysm was identified to be arising from the common femoral artery. 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 Under ultrasound visualization, a 25 gauge spinal needle was directed into the patent portion. The inner stylet was removed and there was arterial blood flow return. Under direct ultrasound visualization, a total of approximately 650 units thrombin were injected until color flow within the aneurysm resolved. The common femoral artery and vein patent. The needle was removed and a bandage was applied. Peripheral pulses were unchanged. The patient tolerated the procedure well was transferred back to the floor in good condition. IMPRESSION: 1. Technically successful celiac ostial stent placement. 2. Technically successful percutaneous thrombin injection to treat right common femoral artery pseudoaneurysm. Ester Sides, MD [facility] Radiology Electronically Signed   By: Ester Sides M.D.   On: 04/15/2024 16:12    Labs:  CBC: Recent Labs    04/12/24 1120 04/13/24 0416 04/15/24 0320 04/16/24 0305  WBC 7.1 5.8 5.2 7.4  HGB 11.4* 10.3* 10.7* 10.5*  HCT 35.3* 32.0* 33.7* 32.7*  PLT 322 264 254 248    COAGS: Recent Labs    03/28/24 1434  INR 1.0    BMP: Recent Labs    04/12/24 1120 04/13/24 0416 04/15/24 0320 04/16/24 0305  NA 137 137 134* 136  K 3.8 4.2 3.9 3.6  CL 101 100 100 99  CO2 23 28 25 26   GLUCOSE 133* 112* 116* 141*  BUN 15 20 18 16   CALCIUM 9.4 8.8* 8.8* 8.8*  CREATININE 1.02* 1.07* 1.00 0.97  GFRNONAA 52* 49* 54* 56*    LIVER FUNCTION TESTS: Recent Labs    03/31/24 0521 04/01/24 0246 04/12/24 1120 04/13/24 0416  BILITOT 0.6 0.9 1.1 1.3*  AST 16 15 17 19   ALT 12 13 11 8   ALKPHOS 73 64 72 57  PROT 6.1* 5.9* 6.6 5.5*  ALBUMIN 3.2* 3.2* 3.8 3.0*    Assessment and Plan:  Doing well post celiac artery stent placement in IR 10/16 Abd pain is less US  Rt  groin pending   Electronically Signed: Sharlet DELENA Candle, PA-C 04/16/2024, 12:04 PM   I spent a total of 15 Minutes at the the patient's bedside AND on the patient's hospital floor or unit, greater than 50% of which was counseling/coordinating care for celiac artery stent placement

## 2024-04-16 NOTE — Progress Notes (Signed)
 [facility]  PROGRESS NOTE  [name] FMW:[id number] DOB: 09/08/1931 DOA: 04/12/2024 PCP: [name], MD   Brief HPI:    89 y.o. 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  At that time she was seen by IR and vascular surgery and they had deemed that stable celiac artery stenosis is not the cause of her symptoms.  Plan was to follow-up with GI as outpatient. Today patient went for follow-up to IR clinic, she continued to complain of abdominal pain which she describes as burning.  Patient says that she has not not been able to eat for past 1 week.  The only thing she eats is broth, chicken soup or yogurt.  Denies vomiting, has been complaining of nausea.  No diarrhea she did have a small BM today.. IR sent her to the ED for admission and possible stent placement in the celiac artery for celiac artery stenosis.    Assessment/Plan:   Heartburn/abdominal pain -Admitted for possible celiac artery stent placement per IR, consulted IR:  Tentatively celiac artery stent planned for Thursday - If patient continues to have symptoms after celiac artery stent placement, consider GI evaluation--- In the past she had EGD which showed Schatzki ring and gastritis - Will continue with Maalox/Mylanta as needed - Continue Protonix  40 mg p.o. twice daily - Patient is currently on aspirin  81 mg p.o. daily  Diabetes mellitus type 2 - Metformin  on hold, continue sliding scale insulin  NovoLog  - CBG well-controlled  Hypertension - Resume blood pressure meds as needed  Generalized anxiety disorder - Continue Xanax        Medications     ALPRAZolam   0.25 mg Oral QHS   amLODipine   10 mg Oral Daily    aspirin   81 mg Oral Daily   cycloSPORINE   1 drop Both Eyes BID   enoxaparin  (LOVENOX ) injection  40 mg Subcutaneous Q24H   feeding supplement  237 mL Oral BID BM   gabapentin   100 mg Oral Daily   insulin  aspart  0-9 Units Subcutaneous TID WC   lidocaine   1 patch Transdermal Q24H   pantoprazole   40 mg Oral BID AC   polyethylene glycol  17 g Oral Daily   SMOG  960 mL Rectal Once   tamsulosin   0.4 mg Oral Daily     Data Reviewed:   CBG:  Recent Labs  Lab 04/15/24 0744 04/15/24 1250 04/15/24 1636 04/15/24 2117 04/16/24 0625  GLUCAP 130* 109* 103* 151* 120*    SpO2: 100 % O2 Flow Rate (L/min): 2 L/min    Vitals:   04/15/24 1947 04/15/24 2325 04/16/24 0347 04/16/24 0753  BP: (!) 153/50 (!) 150/38 (!) 149/45 (!) 180/64  Pulse: (!) 57 65 66 70  Resp: 20 20 20 18   Temp: 98.3 F (36.8 C) 98.1 F (36.7 C) 98 F (36.7 C) 98.5 F (36.9 C)  TempSrc: Oral Oral Oral Oral  SpO2: 99% 92% 93% 100%  Weight:      Height:          Data Reviewed:  Basic Metabolic Panel: Recent Labs  Lab 04/12/24 1120 04/13/24 0416 04/15/24 0320 04/16/24 0305  NA 137 137 134* 136  K 3.8 4.2 3.9 3.6  CL 101 100 100 99  CO2 23 28 25 26   GLUCOSE 133* 112* 116* 141*  BUN 15 20 18 16   CREATININE 1.02* 1.07* 1.00 0.97  CALCIUM 9.4 8.8* 8.8* 8.8*    CBC: Recent Labs  Lab 04/12/24 1120 04/13/24 0416 04/15/24 0320 04/16/24 0305  WBC 7.1 5.8 5.2 7.4  HGB 11.4* 10.3* 10.7* 10.5*  HCT 35.3* 32.0* 33.7* 32.7*  MCV 84.9 86.5 86.4 86.1  PLT 322 264 254 248    LFT Recent Labs  Lab 04/12/24 1120 04/13/24 0416  AST 17 19  ALT 11 8  ALKPHOS 72 57  BILITOT 1.1 1.3*  PROT 6.6 5.5*  ALBUMIN 3.8 3.0*     Antibiotics: Anti-infectives (From admission, onward)    None        DVT prophylaxis: Lovenox   Code Status: DNR  Family Communication: No family at bedside   CONSULTS  -IR   Subjective   No shortness of breath tolerating diet well   Objective    Physical  Examination:    General: Appearance:     Overweight female in no acute distress     Lungs:     respirations unlabored  Heart:    Normal heart rate.   MS:   All extremities are intact.   Neurologic:   Awake, alert     Status is: Inpatient:     [name]   [facility] If 7PM-7AM, please contact night-coverage at [email or web address], Office  [phone]   04/16/2024, 12:07 PM  LOS: 3 days

## 2024-04-16 NOTE — Progress Notes (Signed)
 VASCULAR LAB    Duplex of groin, 1 day post thrombin injection, has been performed.  See CV proc for preliminary results.   Alyxandria Wentz, RVT 04/16/2024, 11:06 AM

## 2024-04-16 NOTE — Significant Event (Signed)
 Rapid Response Event Note   Reason for Call :  Sever abdominal pain/back pain  Initial Focused Assessment:  Patient is alert complaining of 9/10 abdominal pain.  Very tender to light touch.  Generally soft.  She is also complaining of back pain.  She states she is cold.  Skin is warm and dry.   Lung sounds decreased bases Heart tones regular  BP 178/62  HR 60s  RR 20  O2 sat 100% on RA Oral temp 97.9  Dr Juvenal came to bedside.  Interventions:  Morphine  IV for pain Stat CT/CTA abdomen pelvis  Plan of Care:  Ultrasound groin pseudoaneurysm Bed Rest NPO IV fluids Groin check/vascular checks   Event Summary:   MD Notified: Dr Juvenal came to bedside Call Time:  1648 Arrival Time: 1648 End Time: 1845  Elvin Portland, RN

## 2024-04-16 NOTE — Progress Notes (Addendum)
 Came to see patient but patient in stat CT scan abd/pelvis to r/o bleeing.  STAT H/H pending.  RN to notify IR of change in status and to evaluate patient. BP: 153/46 Morphine  given x 1mg  Harlene Bowl DO  U/S groin: Pseudoaneurysm appears partially thrombosed with active neck off the  common femoral artery.   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  Discussed with IR, Dr. Jenna Needs Vas U/S--- bed rest, lovenox  held.

## 2024-04-16 NOTE — Progress Notes (Addendum)
  IR BRIEF PROGRESS NOTE:  Per IR attending, Dr. Jennefer, US  right groin pseudoaneurysm study ordered for today, and in follow-up from thrombin injection yesterday. Order also placed for 1 month follow-up as outpatient in clinic with Dr. Jennefer. IR schedulers will call patient to schedule this once outpatient.   Electronically Signed: Carlin DELENA Griffon, PA-C 04/16/2024, 9:32 AM

## 2024-04-16 NOTE — Progress Notes (Signed)
 Change in Condition: -Pt called RN stating she needed help, that she is in a lot of pain in her abdomen to her back, -Noted grimacing with light palpation, small firm area L lower abd, pt shivering, and moaning. -Attending paged, new orders received.  -IR paged, new orders received.   -RR notified & at bedside -STAT CT/CTA - this RN and RR transported.  Family notified & MD at bedside.

## 2024-04-17 ENCOUNTER — Inpatient Hospital Stay (HOSPITAL_COMMUNITY)

## 2024-04-17 ENCOUNTER — Encounter (HOSPITAL_COMMUNITY): Payer: Self-pay | Admitting: Family Medicine

## 2024-04-17 DIAGNOSIS — I724 Aneurysm of artery of lower extremity: Secondary | ICD-10-CM

## 2024-04-17 DIAGNOSIS — K559 Vascular disorder of intestine, unspecified: Secondary | ICD-10-CM | POA: Diagnosis not present

## 2024-04-17 HISTORY — PX: IR US GUIDE BX ASP/DRAIN: IMG2392

## 2024-04-17 LAB — CBC
HCT: 31.5 % — ABNORMAL LOW (ref 36.0–46.0)
Hemoglobin: 10.1 g/dL — ABNORMAL LOW (ref 12.0–15.0)
MCH: 27.7 pg (ref 26.0–34.0)
MCHC: 32.1 g/dL (ref 30.0–36.0)
MCV: 86.5 fL (ref 80.0–100.0)
Platelets: 232 K/uL (ref 150–400)
RBC: 3.64 MIL/uL — ABNORMAL LOW (ref 3.87–5.11)
RDW: 15.3 % (ref 11.5–15.5)
WBC: 8.2 K/uL (ref 4.0–10.5)
nRBC: 0 % (ref 0.0–0.2)

## 2024-04-17 LAB — BASIC METABOLIC PANEL WITH GFR
Anion gap: 9 (ref 5–15)
BUN: 17 mg/dL (ref 8–23)
CO2: 26 mmol/L (ref 22–32)
Calcium: 8.7 mg/dL — ABNORMAL LOW (ref 8.9–10.3)
Chloride: 103 mmol/L (ref 98–111)
Creatinine, Ser: 0.94 mg/dL (ref 0.44–1.00)
GFR, Estimated: 58 mL/min — ABNORMAL LOW (ref >60)
Glucose, Bld: 142 mg/dL — ABNORMAL HIGH (ref 70–99)
Potassium: 4.2 mmol/L (ref 3.5–5.1)
Sodium: 138 mmol/L (ref 135–145)

## 2024-04-17 LAB — GLUCOSE, CAPILLARY
Glucose-Capillary: 146 mg/dL — ABNORMAL HIGH (ref 70–99)
Glucose-Capillary: 130 mg/dL — ABNORMAL HIGH (ref 70–99)
Glucose-Capillary: 116 mg/dL — ABNORMAL HIGH (ref 70–99)
Glucose-Capillary: 119 mg/dL — ABNORMAL HIGH (ref 70–99)
Glucose-Capillary: 192 mg/dL — ABNORMAL HIGH (ref 70–99)

## 2024-04-17 MED ORDER — MIDAZOLAM HCL (PF) 2 MG/2ML IJ SOLN
INTRAMUSCULAR | Status: AC | PRN
  Administered 2024-04-17: 1 mg via INTRAVENOUS

## 2024-04-17 MED ORDER — FENTANYL CITRATE (PF) 100 MCG/2ML IJ SOLN
INTRAMUSCULAR | Status: AC
  Filled 2024-04-17: qty 2

## 2024-04-17 MED ORDER — THROMBIN FOR PERCUTANEOUS TREATMENT OF PSEUDOANEURYSM (5000UNITS/10ML)
Freq: Once | PERCUTANEOUS | Status: DC
  Filled 2024-04-17: qty 1

## 2024-04-17 MED ORDER — MIDAZOLAM HCL 2 MG/2ML IJ SOLN
INTRAMUSCULAR | Status: AC
  Filled 2024-04-17: qty 2

## 2024-04-17 MED ORDER — THROMBIN FOR PERCUTANEOUS TREATMENT OF PSEUDOANEURYSM (5000UNITS/10ML)
PERCUTANEOUS | Status: AC | PRN
  Administered 2024-04-17: 600 [IU] via PERCUTANEOUS

## 2024-04-17 MED ORDER — FENTANYL CITRATE (PF) 100 MCG/2ML IJ SOLN
INTRAMUSCULAR | Status: AC | PRN
  Administered 2024-04-17: 25 ug via INTRAVENOUS

## 2024-04-17 MED FILL — Lidocaine Inj 1% w/ Epinephrine-1:100000: INTRAMUSCULAR | Qty: 1 | Status: AC

## 2024-04-17 NOTE — Progress Notes (Signed)
 Patient's son, [name], called RN and update given. He asked if the IR Ultra Sound had been performed yet. RN notified [name] that it would be done this morning. He asked for RN to keep him updated.

## 2024-04-17 NOTE — Progress Notes (Signed)
 VASCULAR LAB    Left groin ultrasound to evaluate pseudoaneurysm has been performed.  See CV proc for preliminary results.   Ndrew Creason, RVT 04/17/2024, 9:53 AM

## 2024-04-17 NOTE — Progress Notes (Signed)
 Referring Physician(s): Established patient   Supervising Physician: Karalee Beat  Patient Status:  [facility] - In-pt  Chief Complaint: Mesenteric ischemia s/p SMA stent 04/12/24 (Right CFA access) and celiac artery stent 04/15/24 (Left CFA access). 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  Right pseudoaneurysm was injected with thrombin 04/15/24 during celiac artery stent placement procedure.   Brief HPI Patient had worsening abdominal pain post-procedure and a CTA was obtained. This showed a partially thrombosed right CFA pseudoaneurysm and a new left pseudoaneurysm.   Subjective: Patient resting in bed. She endorses moderate to severe pain in the left groin with palpation. She endorses moderate, intermittent abdominal pain that requires narcotic pain medicine.    Allergies: Statins, Phenergan [promethazine hcl], Hydrocortisone, Lisinopril, Prednisone , and Trazodone  and nefazodone  Medications: Prior to Admission medications   Medication Sig Start Date End Date Taking? Authorizing Provider  acetaminophen  (TYLENOL ) 325 MG tablet TAKE (2) TABLETS BY MOUTH EVERY SIX HOURS AS NEEDED FOR PAIN. MAX 3GM IN 24 HOURS. 07/28/23  Yes Luking, Glendia LABOR, MD  albuterol  (VENTOLIN  HFA) 108 (90 Base) MCG/ACT inhaler INHALE 2 PUFFS INTO THE LUNGS EVERY SIX HOURS AS NEEDED FOR WHEEZING OR SHORTNESS OF BREATH. 09/26/23  Yes Luking, Glendia LABOR, MD  ALPRAZolam  (XANAX ) 0.25 MG tablet Take 1 tablet (0.25 mg total) by mouth at bedtime. 04/02/24  Yes Danford, Lonni SQUIBB, MD  amLODipine  (NORVASC ) 10 MG tablet TAKE (1) TABLET BY MOUTH ONCE DAILY. 10/07/23  Yes Luking, Glendia LABOR, MD  aspirin  (ASPIRIN  LOW DOSE) 81 MG chewable tablet CHEW (1) TABLET BY MOUTH ONCE DAILY. 09/06/23  Yes [name], MD  BETA CAROTENE PROVITAMIN A 25000 units capsule TAKE (1) CAPSULE BY MOUTH AT BEDTIME. 02/24/24  Yes Luking, Scott A, MD  BISACODYL  5 MG EC tablet TAKE 1 TABLET BY MOUTH ONCE DAILY AS NEEDED  FOR MODERATE CONSTIPATION. 12/20/22  Yes [name], MD  cyanocobalamin  1000 MCG tablet Take 1,000 mcg by mouth at bedtime.   Yes [provider]  cycloSPORINE  (RESTASIS ) 0.05 % ophthalmic emulsion Place 1 drop into both eyes 2 (two) times daily.   Yes [provider]  estradiol  (ESTRACE ) 0.1 MG/GM vaginal cream Discard plastic applicator. Insert a blueberry size amount (approximately 1 gram) of cream on fingertip inside vagina at bedtime every night for 1 week then every other night. For long term use. 01/15/24  Yes Larocco, Lauraine BROCKS, FNP  gabapentin  (NEURONTIN ) 100 MG capsule 1 qam for neuropath- new direction Patient taking differently: Take 100 mg by mouth at bedtime. 04/05/24  Yes [name], MD  hydrALAZINE  (APRESOLINE ) 25 MG tablet Take 1 tablet (25 mg total) by mouth 3 (three) times daily. 12/31/23  Yes Luking, Glendia LABOR, MD  IVIZIA DRY EYES 0.5 % SOLN Place 1 drop into both eyes 2 (two) times daily. 09/22/23  Yes [provider]  Melatonin 10 MG TABS Take 10 mg by mouth at bedtime. 03/24/24  Yes Grooms, Courtney, PA-C  metFORMIN  (GLUCOPHAGE ) 500 MG tablet TAKE (1) TABLET BY MOUTH IN THE MORNING & (1/2) TABLET (250MG ) BY MOUTH AT SUPPER 10/07/23  Yes Luking, Glendia LABOR, MD  methenamine  (HIPREX ) 1 g tablet Take 1 tablet (1 g total) by mouth 2 (two) times daily with a meal. Most effective when taken with a daily Vitamin C supplement. 01/15/24  Yes Gerldine Lauraine BROCKS, FNP  Multiple Vitamins-Minerals (PRESERVISION AREDS 2) CAPS Take 1 capsule by mouth in the morning and at bedtime.   Yes  [provider]  mupirocin  ointment (BACTROBAN ) 2 % Apply thin amount twice daily to rash in groin creases twice daily for 5 days at a time when rash occurs 03/07/24  Yes Luking, Glendia LABOR, MD  ondansetron  (ZOFRAN -ODT) 4 MG disintegrating tablet 4mg  ODT q4 hours prn nausea/vomit Patient taking differently: Take 4 mg by mouth every 4 (four) hours as needed for nausea, vomiting or refractory  nausea / vomiting. 09/05/23  Yes Zammit, Joseph, MD  oxyCODONE -acetaminophen  (PERCOCET/ROXICET) 5-325 MG tablet Take 1 tablet by mouth every 4 (four) hours as needed for moderate pain (pain score 4-6) or severe pain (pain score 7-10). 04/02/24  Yes Danford, Lonni SQUIBB, MD  pantoprazole  (PROTONIX ) 40 MG tablet Take 1 tablet (40 mg total) by mouth 2 (two) times daily before a meal. 12/24/23  Yes Cook, Jayce G, DO  polyethylene glycol powder (GOODSENSE CLEARLAX) 17 GM/SCOOP powder 1 scoop in 8 ounces water  once daily as needed for constipation 06/03/23  Yes Luking, Scott A, MD  senna-docusate (SENOKOT-S) 8.6-50 MG tablet Take 1 tablet by mouth at bedtime. 04/02/24  Yes Danford, Lonni SQUIBB, MD  tamsulosin  (FLOMAX ) 0.4 MG CAPS capsule Take 1 capsule (0.4 mg total) by mouth daily. 03/08/24  Yes Dahlstedt, Garnette, MD  valsartan  (DIOVAN ) 320 MG tablet Take 1 tablet (320 mg total) by mouth daily. 12/22/23  Yes Luking, Glendia LABOR, MD  Vitamin D , Cholecalciferol , 10 MCG (400 UNIT) TABS TAKE (1) TABLET BY MOUTH ONCE DAILY. 10/07/23  Yes Luking, Scott A, MD  glucose blood (EASYMAX TEST) test strip CHECK BLOOD SUGAR ONCE DAILY.(CALL MD IF BS BELOW 60: OR IF BS ABOVE 400) 10/07/23   Luking, Glendia LABOR, MD  ketoconazole  (NIZORAL ) 2 % cream Apply 1 Application topically 2 (two) times daily. For 10 days to groin rash Patient not taking: Reported on 04/13/2024 03/03/24   [name], MD  METAMUCIL 4 IN 1 FIBER 25 % PACK Take by mouth 2 (two) times daily. Patient not taking: Reported on 04/13/2024 03/25/24   [provider]  Safety Lancets 28G MISC CHECK BLOOD SUGAR ONCE DAILY. 01/21/24   [name], MD  sodium chloride  1 g tablet TAKE (1) TABLET BY MOUTH TWICE DAILY WITH A MEAL. Patient not taking: Reported on 04/13/2024 01/26/24   [name], MD     Vital Signs: BP 125/70 (BP Location: Right Arm)   Pulse 88   Temp 98.4 F (36.9 C) (Oral)   Resp 18   Ht 5' 8 (1.727 m)   Wt 166 lb (75.3 kg)   SpO2 99%    BMI 25.24 kg/m   Physical Exam Constitutional:      General: She is not in acute distress.    Appearance: She is not ill-appearing.  HENT:     Mouth/Throat:     Mouth: Mucous membranes are moist.     Pharynx: Oropharynx is clear.  Cardiovascular:     Comments: Left CFA site is tender to palpation with a faint ecchymosis. No drainage observed. Right CFA site is non-tender and unremarkable.  Pulmonary:     Effort: Pulmonary effort is normal.  Abdominal:     Tenderness: There is abdominal tenderness.  Skin:    General: Skin is warm and dry.  Neurological:     Mental Status: She is alert and oriented to person, place, and time.     Imaging:   Labs:  CBC: Recent Labs    04/13/24 0416 04/15/24 0320 04/16/24 0305 04/16/24 1907 04/16/24 2112 04/17/24  0331  WBC 5.8 5.2 7.4  --   --  8.2  HGB 10.3* 10.7* 10.5* 10.9* 11.0* 10.1*  HCT 32.0* 33.7* 32.7* 34.0* 34.8* 31.5*  PLT 264 254 248  --   --  232    COAGS: Recent Labs    03/28/24 1434  INR 1.0    BMP: Recent Labs    04/13/24 0416 04/15/24 0320 04/16/24 0305 04/17/24 0331  NA 137 134* 136 138  K 4.2 3.9 3.6 4.2  CL 100 100 99 103  CO2 28 25 26 26   GLUCOSE 112* 116* 141* 142*  BUN 20 18 16 17   CALCIUM 8.8* 8.8* 8.8* 8.7*  CREATININE 1.07* 1.00 0.97 0.94  GFRNONAA 49* 54* 56* 58*    LIVER FUNCTION TESTS: Recent Labs    03/31/24 0521 04/01/24 0246 04/12/24 1120 04/13/24 0416  BILITOT 0.6 0.9 1.1 1.3*  AST 16 15 17 19   ALT 12 13 11 8   ALKPHOS 73 64 72 57  PROT 6.1* 5.9* 6.6 5.5*  ALBUMIN 3.2* 3.2* 3.8 3.0*    Assessment and Plan:  Mesenteric ischemia s/p SMA stent 04/12/24 (Right CFA access) and celiac artery stent 04/15/24 (Left CFA access). 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  Right pseudoaneurysm was injected with thrombin 04/15/24 during celiac artery stent placement procedure.   IR recommends US -guided left common femoral artery thrombin  injection and possible repeat thrombin injection in the right common femoral artery. The patient will be done with sedation due to moderate to severe left groin pain. The procedure was discussed with the patient at the bedside and she was agreeable to proceed. The procedure was also discussed over the phone with her son [name] and verbal consent was obtained from him.   Risks and benefits discussed included bleeding, infection and damage to surrounding areas. The patient has been NPO. Consent is signed and in IR.   Electronically Signed: Warren Dais, AGACNP-BC 04/17/2024, 10:17 AM   I spent a total of 15 Minutes at the the patient's bedside AND on the patient's hospital floor or unit, greater than 50% of which was counseling/coordinating care for mesenteric ischemia/pseudoaneurysms.

## 2024-04-17 NOTE — Plan of Care (Signed)

## 2024-04-17 NOTE — Procedures (Signed)
 Interventional Radiology Procedure Note  Procedure: Successful US  guided thrombin injection of BILATERAL common femoral artery pseudoaneurysms.  300 mcg into each site.   Complications: None  Estimated Blood Loss: None  Recommendations: - Strict bedrest with legs straight x 6 hrs - Will order f/u US  for tomorrow    Signed,  Wilkie LOIS Lent, MD

## 2024-04-17 NOTE — Plan of Care (Signed)

## 2024-04-17 NOTE — Progress Notes (Addendum)
   04/17/24 1241  Vitals  Temp 98.5 F (36.9 C)  Temp Source Oral  BP (!) 146/41  MAP (mmHg) 71  BP Location Right Arm  BP Method Automatic  Patient Position (if appropriate) Lying  Pulse Rate 76  Pulse Rate Source Monitor  ECG Heart Rate 75  Resp 12   Pt back on the floor from IR procedure. Patient is drowsy, but responds to voice. CCMD reconnected. Assessment performed. Pt in no apparent distress at this time.

## 2024-04-17 NOTE — Progress Notes (Signed)
 [facility]  PROGRESS NOTE  [name] FMW:[id number] DOB: Sep 02, 1931 DOA: 04/12/2024 PCP: [name], MD   Brief HPI:    89 y.o. 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  At that time she was seen by IR and vascular surgery and they had deemed that stable celiac artery stenosis is not the cause of her symptoms.  Plan was to follow-up with GI as outpatient. Today patient went for follow-up to IR clinic, she continued to complain of abdominal pain which she describes as burning.  Patient says that she has not not been able to eat for past 1 week.  The only thing she eats is broth, chicken soup or yogurt.  Denies vomiting, has been complaining of nausea.  No diarrhea she did have a small BM today.. IR sent her to the ED for admission and possible stent placement in the celiac artery for celiac artery stenosis. Hospital stay complicated by pseudoaneurysms at access site    Assessment/Plan:  Mesenteric ischemia -Superior mesenteric artery stent - Complicated by 2 pseudoaneurysms status post bilateral ultrasound-guided thrombin injection into the common femoral artery pseudoaneurysms-appreciate IR's consultation-strict bedrest x 6 hours and follow-up ultrasound in the a.m. - Morphine  as needed  Diabetes mellitus type 2 - Metformin  on hold, continue sliding scale insulin  NovoLog  - CBG well-controlled  Hypertension - Resume blood pressure meds   Generalized anxiety disorder - Continue Xanax        Medications     ALPRAZolam   0.25 mg Oral QHS   amLODipine   10 mg Oral Daily   aspirin   81 mg Oral Daily   cycloSPORINE   1 drop Both Eyes BID   feeding supplement  237 mL Oral BID  BM   gabapentin   100 mg Oral Daily   insulin  aspart  0-9 Units Subcutaneous TID WC   lidocaine   1 patch Transdermal Q24H   pantoprazole   40 mg Oral BID AC   polyethylene glycol  17 g Oral Daily   SMOG  960 mL Rectal Once   tamsulosin   0.4 mg Oral Daily   thrombin 5,000 units for percutaneous treatment of pseudoaneurysm   Percutaneous Once     Data Reviewed:   CBG:  Recent Labs  Lab 04/16/24 1220 04/16/24 1822 04/16/24 2124 04/17/24 0631 04/17/24 1239  GLUCAP 142* 126* 136* 146* 130*    SpO2: 95 % O2 Flow Rate (L/min): 2 L/min    Vitals:   04/17/24 1220 04/17/24 1225 04/17/24 1240 04/17/24 1241  BP: (!) 141/44  (!) 146/41 (!) 146/41  Pulse: 73 75  76  Resp: 17 15  12   Temp:    98.5 F (36.9 C)  TempSrc:    Oral  SpO2: 98% 95%    Weight:      Height:          Data Reviewed:  Basic Metabolic Panel: Recent Labs  Lab 04/12/24 1120 04/13/24 0416 04/15/24 0320 04/16/24 0305 04/17/24 0331  NA 137 137 134* 136 138  K 3.8 4.2 3.9 3.6 4.2  CL 101 100 100 99 103  CO2 23 28 25 26 26   GLUCOSE 133* 112* 116* 141* 142*  BUN 15 20 18 16 17   CREATININE 1.02* 1.07* 1.00 0.97 0.94  CALCIUM  9.4 8.8* 8.8* 8.8* 8.7*    CBC: Recent Labs  Lab 04/12/24 1120 04/13/24 0416 04/15/24 0320 04/16/24 0305 04/16/24 1907 04/16/24 2112 04/17/24 0331  WBC 7.1 5.8 5.2 7.4  --   --  8.2  HGB 11.4* 10.3* 10.7* 10.5* 10.9* 11.0* 10.1*  HCT 35.3* 32.0* 33.7* 32.7* 34.0* 34.8* 31.5*  MCV 84.9 86.5 86.4 86.1  --   --  86.5  PLT 322 264 254 248  --   --  232    LFT Recent Labs  Lab 04/12/24 1120 04/13/24 0416  AST 17 19  ALT 11 8  ALKPHOS 72 57  BILITOT 1.1 1.3*  PROT 6.6 5.5*  ALBUMIN 3.8 3.0*     Antibiotics: Anti-infectives (From admission, onward)    None        DVT prophylaxis: Lovenox   Code Status: DNR  Family Communication: Spoke with son on 10/17   CONSULTS  -IR   Subjective   Sleepy post procedure   Objective    Physical  Examination:    General: Appearance:     Overweight female in no acute distress     Lungs:     respirations unlabored  Heart:    Normal heart rate.   MS:   All extremities are intact.   Neurologic: Sleeping     Status is: Inpatient:     [name] Bowl   [facility] If 7PM-7AM, please contact night-coverage at [email or web address], Office  [phone]   04/17/2024, 2:02 PM  LOS: 4 days

## 2024-04-18 ENCOUNTER — Inpatient Hospital Stay (HOSPITAL_COMMUNITY)

## 2024-04-18 DIAGNOSIS — I724 Aneurysm of artery of lower extremity: Secondary | ICD-10-CM | POA: Diagnosis not present

## 2024-04-18 DIAGNOSIS — K559 Vascular disorder of intestine, unspecified: Secondary | ICD-10-CM | POA: Diagnosis not present

## 2024-04-18 LAB — BASIC METABOLIC PANEL WITH GFR
Anion gap: 10 (ref 5–15)
BUN: 19 mg/dL (ref 8–23)
CO2: 26 mmol/L (ref 22–32)
Calcium: 9.2 mg/dL (ref 8.9–10.3)
Chloride: 102 mmol/L (ref 98–111)
Creatinine, Ser: 0.88 mg/dL (ref 0.44–1.00)
GFR, Estimated: >60 mL/min (ref >60)
Glucose, Bld: 179 mg/dL — ABNORMAL HIGH (ref 70–99)
Potassium: 3.9 mmol/L (ref 3.5–5.1)
Sodium: 138 mmol/L (ref 135–145)

## 2024-04-18 LAB — CBC
HCT: 31.2 % — ABNORMAL LOW (ref 36.0–46.0)
Hemoglobin: 10 g/dL — ABNORMAL LOW (ref 12.0–15.0)
MCH: 28.2 pg (ref 26.0–34.0)
MCHC: 32.1 g/dL (ref 30.0–36.0)
MCV: 87.9 fL (ref 80.0–100.0)
Platelets: 227 K/uL (ref 150–400)
RBC: 3.55 MIL/uL — ABNORMAL LOW (ref 3.87–5.11)
RDW: 15.2 % (ref 11.5–15.5)
WBC: 7.2 K/uL (ref 4.0–10.5)
nRBC: 0 % (ref 0.0–0.2)

## 2024-04-18 LAB — GLUCOSE, CAPILLARY
Glucose-Capillary: 150 mg/dL — ABNORMAL HIGH (ref 70–99)
Glucose-Capillary: 160 mg/dL — ABNORMAL HIGH (ref 70–99)
Glucose-Capillary: 154 mg/dL — ABNORMAL HIGH (ref 70–99)
Glucose-Capillary: 165 mg/dL — ABNORMAL HIGH (ref 70–99)

## 2024-04-18 NOTE — Progress Notes (Signed)
 Referring Physician(s): Established patient   Supervising Physician: Karalee Beat  Patient Status:  [facility] - In-pt  Chief Complaint: Mesenteric ischemia s/p SMA stent 04/12/24 (Right CFA access) and celiac artery stent 04/15/24 (Left CFA access). 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 Right pseudoaneurysm was injected with thrombin 04/15/24 during celiac artery stent placement procedure.   Brief HPI Patient had worsening abdominal pain post-procedure and a CTA was obtained. This showed a partially thrombosed right CFA pseudoaneurysm and a new left pseudoaneurysm. She underwent technically successful bilateral femoral thrombin injections 04/17/24 by Dr. Karalee. Repeat vascular ultrasound ordered for today.   Subjective:  Patient resting in bed talking on the phone. She states her left groin site feels somewhat better.   Allergies: Statins, Phenergan [promethazine hcl], Hydrocortisone, Lisinopril, Prednisone , and Trazodone  and nefazodone  Medications: Prior to Admission medications   Medication Sig Start Date End Date Taking? Authorizing Provider  acetaminophen  (TYLENOL ) 325 MG tablet TAKE (2) TABLETS BY MOUTH EVERY SIX HOURS AS NEEDED FOR PAIN. MAX 3GM IN 24 HOURS. 07/28/23  Yes [name], MD  albuterol  (VENTOLIN  HFA) 108 (90 Base) MCG/ACT inhaler INHALE 2 PUFFS INTO THE LUNGS EVERY SIX HOURS AS NEEDED FOR WHEEZING OR SHORTNESS OF BREATH. 09/26/23  Yes Luking, Glendia LABOR, MD  ALPRAZolam  (XANAX ) 0.25 MG tablet Take 1 tablet (0.25 mg total) by mouth at bedtime. 04/02/24  Yes Danford, Lonni SQUIBB, MD  amLODipine  (NORVASC ) 10 MG tablet TAKE (1) TABLET BY MOUTH ONCE DAILY. 10/07/23  Yes Luking, Glendia LABOR, MD  aspirin  (ASPIRIN  LOW DOSE) 81 MG chewable tablet CHEW (1) TABLET BY MOUTH ONCE DAILY. 09/06/23  Yes [name], MD  BETA CAROTENE PROVITAMIN A 25000 units capsule TAKE (1) CAPSULE BY MOUTH AT BEDTIME. 02/24/24  Yes Luking, Scott A, MD   BISACODYL  5 MG EC tablet TAKE 1 TABLET BY MOUTH ONCE DAILY AS NEEDED FOR MODERATE CONSTIPATION. 12/20/22  Yes [name], MD  cyanocobalamin  1000 MCG tablet Take 1,000 mcg by mouth at bedtime.   Yes [provider]  cycloSPORINE  (RESTASIS ) 0.05 % ophthalmic emulsion Place 1 drop into both eyes 2 (two) times daily.   Yes [provider]  estradiol  (ESTRACE ) 0.1 MG/GM vaginal cream Discard plastic applicator. Insert a blueberry size amount (approximately 1 gram) of cream on fingertip inside vagina at bedtime every night for 1 week then every other night. For long term use. 01/15/24  Yes Larocco, Lauraine BROCKS, FNP  gabapentin  (NEURONTIN ) 100 MG capsule 1 qam for neuropath- new direction Patient taking differently: Take 100 mg by mouth at bedtime. 04/05/24  Yes [name], MD  hydrALAZINE  (APRESOLINE ) 25 MG tablet Take 1 tablet (25 mg total) by mouth 3 (three) times daily. 12/31/23  Yes Luking, Glendia LABOR, MD  IVIZIA DRY EYES 0.5 % SOLN Place 1 drop into both eyes 2 (two) times daily. 09/22/23  Yes [provider]  Melatonin 10 MG TABS Take 10 mg by mouth at bedtime. 03/24/24  Yes Grooms, Courtney, PA-C  metFORMIN  (GLUCOPHAGE ) 500 MG tablet TAKE (1) TABLET BY MOUTH IN THE MORNING & (1/2) TABLET (250MG ) BY MOUTH AT SUPPER 10/07/23  Yes Luking, Glendia LABOR, MD  methenamine  (HIPREX ) 1 g tablet Take 1 tablet (1 g total) by mouth 2 (two) times daily with a meal. Most effective when taken with a daily Vitamin C supplement. 01/15/24  Yes Gerldine Lauraine BROCKS, FNP  Multiple Vitamins-Minerals (PRESERVISION AREDS 2) CAPS Take 1 capsule by mouth in the  morning and at bedtime.   Yes [provider]  mupirocin  ointment (BACTROBAN ) 2 % Apply thin amount twice daily to rash in groin creases twice daily for 5 days at a time when rash occurs 03/07/24  Yes Luking, Glendia LABOR, MD  ondansetron  (ZOFRAN -ODT) 4 MG disintegrating tablet 4mg  ODT q4 hours prn nausea/vomit Patient taking differently: Take 4 mg by  mouth every 4 (four) hours as needed for nausea, vomiting or refractory nausea / vomiting. 09/05/23  Yes Zammit, Joseph, MD  oxyCODONE -acetaminophen  (PERCOCET/ROXICET) 5-325 MG tablet Take 1 tablet by mouth every 4 (four) hours as needed for moderate pain (pain score 4-6) or severe pain (pain score 7-10). 04/02/24  Yes Danford, Lonni SQUIBB, MD  pantoprazole  (PROTONIX ) 40 MG tablet Take 1 tablet (40 mg total) by mouth 2 (two) times daily before a meal. 12/24/23  Yes Cook, Jayce G, DO  polyethylene glycol powder (GOODSENSE CLEARLAX) 17 GM/SCOOP powder 1 scoop in 8 ounces water  once daily as needed for constipation 06/03/23  Yes Luking, Scott A, MD  senna-docusate (SENOKOT-S) 8.6-50 MG tablet Take 1 tablet by mouth at bedtime. 04/02/24  Yes Danford, Lonni SQUIBB, MD  tamsulosin  (FLOMAX ) 0.4 MG CAPS capsule Take 1 capsule (0.4 mg total) by mouth daily. 03/08/24  Yes Dahlstedt, Garnette, MD  valsartan  (DIOVAN ) 320 MG tablet Take 1 tablet (320 mg total) by mouth daily. 12/22/23  Yes Luking, Glendia LABOR, MD  Vitamin D , Cholecalciferol , 10 MCG (400 UNIT) TABS TAKE (1) TABLET BY MOUTH ONCE DAILY. 10/07/23  Yes Luking, Scott A, MD  glucose blood (EASYMAX TEST) test strip CHECK BLOOD SUGAR ONCE DAILY.(CALL MD IF BS BELOW 60: OR IF BS ABOVE 400) 10/07/23   Luking, Glendia LABOR, MD  ketoconazole  (NIZORAL ) 2 % cream Apply 1 Application topically 2 (two) times daily. For 10 days to groin rash Patient not taking: Reported on 04/13/2024 03/03/24   [name], MD  METAMUCIL 4 IN 1 FIBER 25 % PACK Take by mouth 2 (two) times daily. Patient not taking: Reported on 04/13/2024 03/25/24   [provider]  Safety Lancets 28G MISC CHECK BLOOD SUGAR ONCE DAILY. 01/21/24   [name], MD  sodium chloride  1 g tablet TAKE (1) TABLET BY MOUTH TWICE DAILY WITH A MEAL. Patient not taking: Reported on 04/13/2024 01/26/24   [name], MD     Vital Signs: BP (!) 149/45   Pulse 70   Temp 98.6 F (37 C) (Oral)   Resp 20   Ht 5'  8 (1.727 m)   Wt 166 lb (75.3 kg)   SpO2 97%   BMI 25.24 kg/m   Physical Exam Constitutional:      General: She is not in acute distress.    Appearance: She is not ill-appearing.  Cardiovascular:     Rate and Rhythm: Normal rate.     Comments: Left common femoral artery site is tender to palpation but less so than yesterday. There is a small, firm area at the access site with mild ecchymosis. Right CFA site is non-tender and unremarkable.  Abdominal:     Tenderness: There is abdominal tenderness.  Skin:    General: Skin is warm and dry.  Neurological:     Mental Status: She is alert and oriented to person, place, and time.     Imaging:   Labs:  CBC: Recent Labs    04/15/24 0320 04/16/24 0305 04/16/24 1907 04/16/24 2112 04/17/24 0331 04/18/24 0400  WBC 5.2 7.4  --   --  8.2 7.2  HGB 10.7* 10.5* 10.9* 11.0* 10.1* 10.0*  HCT 33.7* 32.7* 34.0* 34.8* 31.5* 31.2*  PLT 254 248  --   --  232 227    COAGS: Recent Labs    03/28/24 1434  INR 1.0    BMP: Recent Labs    04/15/24 0320 04/16/24 0305 04/17/24 0331 04/18/24 0400  NA 134* 136 138 138  K 3.9 3.6 4.2 3.9  CL 100 99 103 102  CO2 25 26 26 26   GLUCOSE 116* 141* 142* 179*  BUN 18 16 17 19   CALCIUM 8.8* 8.8* 8.7* 9.2  CREATININE 1.00 0.97 0.94 0.88  GFRNONAA 54* 56* 58* >60    LIVER FUNCTION TESTS: Recent Labs    03/31/24 0521 04/01/24 0246 04/12/24 1120 04/13/24 0416  BILITOT 0.6 0.9 1.1 1.3*  AST 16 15 17 19   ALT 12 13 11 8   ALKPHOS 73 64 72 57  PROT 6.1* 5.9* 6.6 5.5*  ALBUMIN 3.2* 3.2* 3.8 3.0*    Assessment and Plan:  Mesenteric ischemia s/p SMA stent 04/12/24 (Right CFA access) and celiac artery stent 04/15/24 (Left CFA access). 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 Right pseudoaneurysm was injected with thrombin 04/15/24 during celiac artery stent placement procedure. The left and right femoral arteries were injected with thrombin  04/17/24.  Dr. Karalee reviewed the images from today's vascular ultrasound. The right pseudoaneurysm is still open but is smaller. 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 This site has now been treated twice and if the thrombin injections haven't been completely effective it's likely because it is well formed and has been chronic.   The left pseudoaneurysm is fixed. The patient is stable to ambulate and work with physical therapy.   Follow up with IR as needed.   Electronically Signed: Warren Dais, AGACNP-BC 04/18/2024, 10:26 AM   I spent a total of 15 Minutes at the the patient's bedside AND on the patient's hospital floor or unit, greater than 50% of which was counseling/coordinating care for mesenteric ischemia.

## 2024-04-18 NOTE — Progress Notes (Signed)
 [facility]  PROGRESS NOTE  [name] FMW:[id number] DOB: August 05, 1931 DOA: 04/12/2024 PCP: [name], MD   Brief HPI:    89 y.o. 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  At that time she was seen by IR and vascular surgery and they had deemed that stable celiac artery stenosis is not the cause of her symptoms.  Plan was to follow-up with GI as outpatient. Today patient went for follow-up to IR clinic, she continued to complain of abdominal pain which she describes as burning.  Patient says that she has not not been able to eat for past 1 week.  The only thing she eats is broth, chicken soup or yogurt.  Denies vomiting, has been complaining of nausea.  No diarrhea she did have a small BM today.. IR sent her to the ED for admission and possible stent placement in the celiac artery for celiac artery stenosis. Hospital stay complicated by pseudoaneurysms at access site    Assessment/Plan:  Mesenteric ischemia -Superior mesenteric artery stent - Complicated by 2 pseudoaneurysms status post bilateral ultrasound-guided thrombin injection into the common femoral artery pseudoaneurysms 10/19: IR: the right is still open but even smaller.  Would not do anything more- had that for a year and its not symptomatic.  It has been injected  2x just b/c its there and it wont take, probably because its so chronic and well formed. The left is fixed. She should be fine for PT.  -Pain control  Diabetes mellitus type 2 - Metformin  on hold, continue sliding scale insulin  NovoLog  - CBG well-controlled  Hypertension - Resume blood pressure meds   Generalized anxiety disorder - Continue Xanax         Medications     ALPRAZolam   0.25 mg Oral QHS   amLODipine   10 mg Oral Daily   aspirin   81 mg Oral Daily   cycloSPORINE   1 drop Both Eyes BID   feeding supplement  237 mL Oral BID BM   gabapentin   100 mg Oral Daily   insulin  aspart  0-9 Units Subcutaneous TID WC   lidocaine   1 patch Transdermal Q24H   pantoprazole   40 mg Oral BID AC   polyethylene glycol  17 g Oral Daily   SMOG  960 mL Rectal Once   tamsulosin   0.4 mg Oral Daily   thrombin 5,000 units for percutaneous treatment of pseudoaneurysm   Percutaneous Once     Data Reviewed:   CBG:  Recent Labs  Lab 04/17/24 1627 04/17/24 1735 04/17/24 2108 04/18/24 0609 04/18/24 1231  GLUCAP 116* 119* 192* 150* 160*    SpO2: 95 % O2 Flow Rate (L/min): 2 L/min    Vitals:   04/18/24 0830 04/18/24 0831 04/18/24 1046 04/18/24 1233  BP: (!) 149/45 (!) 149/45  (!) 138/42  Pulse: 70  73 70  Resp: 20  19 19   Temp: 97.8 F (36.6 C)   98.5 F (36.9 C)  TempSrc: Axillary   Oral  SpO2: 97%  97% 95%  Weight:      Height:          Data Reviewed:  Basic Metabolic Panel: Recent Labs  Lab 04/13/24 0416 04/15/24 0320 04/16/24 0305 04/17/24 0331 04/18/24 0400  NA 137  134* 136 138 138  K 4.2 3.9 3.6 4.2 3.9  CL 100 100 99 103 102  CO2 28 25 26 26 26   GLUCOSE 112* 116* 141* 142* 179*  BUN 20 18 16 17 19   CREATININE 1.07* 1.00 0.97 0.94 0.88  CALCIUM 8.8* 8.8* 8.8* 8.7* 9.2    CBC: Recent Labs  Lab 04/13/24 0416 04/15/24 0320 04/16/24 0305 04/16/24 1907 04/16/24 2112 04/17/24 0331 04/18/24 0400  WBC 5.8 5.2 7.4  --   --  8.2 7.2  HGB 10.3* 10.7* 10.5* 10.9* 11.0* 10.1* 10.0*  HCT 32.0* 33.7* 32.7* 34.0* 34.8* 31.5* 31.2*  MCV 86.5 86.4 86.1  --   --  86.5 87.9  PLT 264 254 248  --   --  232 227    LFT Recent Labs  Lab 04/12/24 1120 04/13/24 0416  AST 17 19  ALT 11 8  ALKPHOS 72 57  BILITOT 1.1 1.3*  PROT 6.6 5.5*  ALBUMIN 3.8 3.0*     Antibiotics: Anti-infectives (From admission,  onward)    None        DVT prophylaxis: Lovenox   Code Status: DNR  Family Communication: Spoke with son on 10/17   CONSULTS  -IR   Subjective   In bed, getting ultrasound done.  Passing gas   Objective    Physical Examination:    General: Appearance:     Overweight female in no acute distress     Lungs:     respirations unlabored  Heart:    Normal heart rate.   MS:   All extremities are intact.         Status is: Inpatient:     [name]   [facility] If 7PM-7AM, please contact night-coverage at [email or web address], Office  [phone]   04/18/2024, 1:27 PM  LOS: 5 days

## 2024-04-18 NOTE — Plan of Care (Signed)
   Problem: Clinical Measurements: Goal: Ability to maintain clinical measurements within normal limits will improve Outcome: Not Progressing Goal: Will remain free from infection Outcome: Not Progressing Goal: Diagnostic test results will improve Outcome: Not Progressing Goal: Respiratory complications will improve Outcome: Not Progressing Goal: Cardiovascular complication will be avoided Outcome: Not Progressing

## 2024-04-18 NOTE — Progress Notes (Signed)
 VASCULAR LAB    Bilateral groin duplex post thrombin injection of bilateral pseudoaneurysms has been performed.  See CV proc for preliminary results.   Javona Bergevin, RVT 04/18/2024, 10:31 AM

## 2024-04-18 NOTE — Progress Notes (Signed)
 Mobility Specialist Progress Note:   04/18/24 1331  Mobility  Activity Ambulated with assistance  Level of Assistance Standby assist, set-up cues, supervision of patient - no hands on  Assistive Device Front wheel walker  Distance Ambulated (ft) 250 ft  Activity Response Tolerated well  Mobility Referral Yes  Mobility visit 1 Mobility  Mobility Specialist Start Time (ACUTE ONLY) 1331  Mobility Specialist Stop Time (ACUTE ONLY) 1352  Mobility Specialist Time Calculation (min) (ACUTE ONLY) 21 min   Pt received in bed, agreeable to mobility session. Ambulated in hallway with RW. Tolerated well, max RR 51. Returned to room, lying in bed with all needs met.   Mazikeen Hehn Mobility Specialist Please contact via Special educational needs teacher or  Rehab office at [phone]

## 2024-04-19 DIAGNOSIS — K559 Vascular disorder of intestine, unspecified: Secondary | ICD-10-CM | POA: Diagnosis not present

## 2024-04-19 LAB — CBC
HCT: 29.4 % — ABNORMAL LOW (ref 36.0–46.0)
Hemoglobin: 9.3 g/dL — ABNORMAL LOW (ref 12.0–15.0)
MCH: 27.9 pg (ref 26.0–34.0)
MCHC: 31.6 g/dL (ref 30.0–36.0)
MCV: 88.3 fL (ref 80.0–100.0)
Platelets: 221 K/uL (ref 150–400)
RBC: 3.33 MIL/uL — ABNORMAL LOW (ref 3.87–5.11)
RDW: 15.2 % (ref 11.5–15.5)
WBC: 7.9 K/uL (ref 4.0–10.5)
nRBC: 0 % (ref 0.0–0.2)

## 2024-04-19 LAB — GLUCOSE, CAPILLARY
Glucose-Capillary: 147 mg/dL — ABNORMAL HIGH (ref 70–99)
Glucose-Capillary: 223 mg/dL — ABNORMAL HIGH (ref 70–99)
Glucose-Capillary: 170 mg/dL — ABNORMAL HIGH (ref 70–99)
Glucose-Capillary: 178 mg/dL — ABNORMAL HIGH (ref 70–99)
Glucose-Capillary: 190 mg/dL — ABNORMAL HIGH (ref 70–99)

## 2024-04-19 LAB — BASIC METABOLIC PANEL WITH GFR
Anion gap: 11 (ref 5–15)
BUN: 29 mg/dL — ABNORMAL HIGH (ref 8–23)
CO2: 26 mmol/L (ref 22–32)
Calcium: 9.2 mg/dL (ref 8.9–10.3)
Chloride: 101 mmol/L (ref 98–111)
Creatinine, Ser: 1.04 mg/dL — ABNORMAL HIGH (ref 0.44–1.00)
GFR, Estimated: 51 mL/min — ABNORMAL LOW (ref >60)
Glucose, Bld: 166 mg/dL — ABNORMAL HIGH (ref 70–99)
Potassium: 4.8 mmol/L (ref 3.5–5.1)
Sodium: 138 mmol/L (ref 135–145)

## 2024-04-19 MED ORDER — FLORANEX PO PACK
1.0000 g | PACK | Freq: Three times a day (TID) | ORAL | Status: DC
  Administered 2024-04-19 – 2024-04-22 (×10): 1 g via ORAL
  Filled 2024-04-19 (×11): qty 1

## 2024-04-19 NOTE — Evaluation (Signed)
 Physical Therapy Evaluation Patient Details Name: [name] MRN: [id number] DOB: May 02, 1932 Today's Date: 04/19/2024  History of Present Illness  Pt is 89 yo presenting to [facility] on 10/13 due to abdominal pain. S/p celiac artery stent placement, R CFA pseudoaneurysm thrombin injection 10/16, thrombin injection of bil common femoral artery pseudoaneurysms on 10/18. PMH: DM II, HTN, hyperlipidemia, mesenteric artery stenosis.  Clinical Impression  Pt is presenting at mod I for bed mobility, CGA to Min A for sit to stand and CGA to supervision for gait with RW. Pt is currently at a high risk for falls. Due to pt current functional status, home set up and available assistance at home recommending skilled physical therapy services < 3 hours/day in order to address strength, balance and functional mobility to decrease risk for falls, injury, immobility, skin break down and re-hospitalization.          If plan is discharge home, recommend the following: A little help with bathing/dressing/bathroom;Assistance with cooking/housework;Assist for transportation;Help with stairs or ramp for entrance   Can travel by private vehicle   Yes    Equipment Recommendations None recommended by PT     Functional Status Assessment Patient has had a recent decline in their functional status and demonstrates the ability to make significant improvements in function in a reasonable and predictable amount of time.     Precautions / Restrictions Precautions Precautions: Fall Recall of Precautions/Restrictions: Intact Restrictions Weight Bearing Restrictions Per Provider Order: No      Mobility  Bed Mobility   General bed mobility comments: sitting on edge of bed and arrival and departure.    Transfers Overall transfer level: Needs assistance Equipment used: Rolling walker (2 wheels) Transfers: Sit to/from Stand Sit to Stand: Contact guard assist, Min assist           General transfer comment:  CGA for sit to stand with Min A stabilization upon standing    Ambulation/Gait Ambulation/Gait assistance: Contact guard assist, Supervision Gait Distance (Feet): 250 Feet Assistive device: Rolling walker (2 wheels) Gait Pattern/deviations: Step-through pattern, Decreased stride length Gait velocity: decreased Gait velocity interpretation: <1.8 ft/sec, indicate of risk for recurrent falls   General Gait Details: CGA for stabilization/safety     Balance Overall balance assessment: Needs assistance Sitting-balance support: Bilateral upper extremity supported, Feet supported Sitting balance-Leahy Scale: Fair     Standing balance support: Bilateral upper extremity supported, Reliant on assistive device for balance, During functional activity Standing balance-Leahy Scale: Poor Standing balance comment: CGA for safety       Pertinent Vitals/Pain Pain Assessment Pain Assessment: Faces Faces Pain Scale: No hurt Breathing: normal Negative Vocalization: none Facial Expression: smiling or inexpressive Body Language: relaxed Consolability: no need to console PAINAD Score: 0 Pain Intervention(s): Monitored during session    Home Living Family/patient expects to be discharged to:: Assisted living     Home Equipment: Rolling Walker (2 wheels) Additional Comments: pt reports she hasn't had PT in about a month    Prior Function Prior Level of Function : Independent/Modified Independent;Needs assist       Physical Assist : ADLs (physical)   ADLs (physical): Bathing;IADLs Mobility Comments: Ambulates using RW. Denies fall history. ADLs Comments: ModI with most ADLs. Reports supervision from staff while bathing. Staff manage medications, cleaning, and meals.     Extremity/Trunk Assessment   Upper Extremity Assessment Upper Extremity Assessment: Defer to OT evaluation    Lower Extremity Assessment Lower Extremity Assessment: Overall WFL for tasks assessed  Cervical /  Trunk Assessment Cervical / Trunk Assessment: Normal  Communication   Communication Communication: No apparent difficulties    Cognition Arousal: Alert Behavior During Therapy: WFL for tasks assessed/performed   PT - Cognitive impairments: No apparent impairments     PT - Cognition Comments: Pt A,Ox4 Following commands: Intact       Cueing Cueing Techniques: Verbal cues     General Comments General comments (skin integrity, edema, etc.): vital signs stable on room air        Assessment/Plan    PT Assessment Patient needs continued PT services  PT Problem List Decreased activity tolerance;Decreased balance;Decreased mobility       PT Treatment Interventions DME instruction;Gait training;Functional mobility training;Therapeutic activities;Therapeutic exercise;Balance training;Patient/family education    PT Goals (Current goals can be found in the Care Plan section)  Acute Rehab PT Goals Patient Stated Goal: to get stronger before returning to ALF PT Goal Formulation: With patient Time For Goal Achievement: 05/03/24 Potential to Achieve Goals: Good    Frequency Min 1X/week        AM-PAC PT 6 Clicks Mobility  Outcome Measure Help needed turning from your back to your side while in a flat bed without using bedrails?: None Help needed moving from lying on your back to sitting on the side of a flat bed without using bedrails?: None Help needed moving to and from a bed to a chair (including a wheelchair)?: A Little Help needed standing up from a chair using your arms (e.g., wheelchair or bedside chair)?: A Little Help needed to walk in hospital room?: A Little Help needed climbing 3-5 steps with a railing? : A Lot 6 Click Score: 19    End of Session Equipment Utilized During Treatment: Gait belt Activity Tolerance: Patient tolerated treatment well Patient left: in bed;with call bell/phone within reach Nurse Communication: Mobility status PT Visit Diagnosis:  Other abnormalities of gait and mobility (R26.89)    Time: 8844-8776 PT Time Calculation (min) (ACUTE ONLY): 28 min   Charges:   PT Evaluation $PT Eval Low Complexity: 1 Low PT Treatments $Therapeutic Activity: 8-22 mins PT General Charges $$ ACUTE PT VISIT: 1 Visit        Dorothyann Maier, DPT, CLT  Acute Rehabilitation Services Office: [phone] (Secure chat preferred)   Dorothyann VEAR Maier 04/19/2024, 1:20 PM

## 2024-04-19 NOTE — Plan of Care (Signed)

## 2024-04-19 NOTE — Progress Notes (Signed)
 [facility]  PROGRESS NOTE  [name] FMW:[id number] DOB: 06/13/1932 DOA: 04/12/2024 PCP: [name], MD   Brief HPI:    89 y.o. 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  At that time she was seen by IR and vascular surgery and they had deemed that stable celiac artery stenosis is not the cause of her symptoms.  Plan was to follow-up with GI as outpatient. Today patient went for follow-up to IR clinic, she continued to complain of abdominal pain which she describes as burning.  Patient says that she has not not been able to eat for past 1 week.  The only thing she eats is broth, chicken soup or yogurt.  Denies vomiting, has been complaining of nausea.  No diarrhea she did have a small BM today.. IR sent her to the ED for admission and possible stent placement in the celiac artery for celiac artery stenosis. Hospital stay complicated by pseudoaneurysms at access site    Assessment/Plan:  Mesenteric ischemia -Superior mesenteric artery stent - Complicated by 2 pseudoaneurysms status post bilateral ultrasound-guided thrombin injection into the common femoral artery pseudoaneurysms 10/19: IR: the right is still open but even smaller.  Would not do anything more- had that for a year and its not symptomatic.  It has been injected  2x just b/c its there and it wont take, probably because its so chronic and well formed. The left is fixed. She should be fine for PT.  -Pain control  Diabetes mellitus type 2 - Metformin  on hold, continue sliding scale insulin  NovoLog  - CBG well-controlled  Hypertension - Resume blood pressure meds   Generalized anxiety disorder - Continue Xanax     PT  eval-family inquiring about SNF placement, from [facility] consulted   Medications     ALPRAZolam   0.25 mg Oral QHS   amLODipine   10 mg Oral Daily   aspirin   81 mg Oral Daily   cycloSPORINE   1 drop Both Eyes BID   feeding supplement  237 mL Oral BID BM   gabapentin   100 mg Oral Daily   insulin  aspart  0-9 Units Subcutaneous TID WC   lactobacillus  1 g Oral TID WC   lidocaine   1 patch Transdermal Q24H   pantoprazole   40 mg Oral BID AC   polyethylene glycol  17 g Oral Daily   SMOG  960 mL Rectal Once   tamsulosin   0.4 mg Oral Daily   thrombin 5,000 units for percutaneous treatment of pseudoaneurysm   Percutaneous Once     Data Reviewed:   CBG:  Recent Labs  Lab 04/18/24 1625 04/18/24 2108 04/19/24 0613 04/19/24 0806 04/19/24 1117  GLUCAP 154* 165* 147* 223* 170*    SpO2: 95 % O2 Flow Rate (L/min): 2 L/min    Vitals:   04/19/24 0358 04/19/24 0748 04/19/24 0922 04/19/24 1119  BP: (!) 147/44 (!) 145/48 (!) 159/46 (!) 149/51  Pulse: 69 78 68 76  Resp: 17 20 20 20   Temp: 98.3 F (36.8 C) 97.7 F (36.5 C)  98.6 F (37 C)  TempSrc: Oral Oral  Oral  SpO2: 95% 94% 96% 95%  Weight:      Height:          Data Reviewed:  Basic Metabolic Panel: Recent Labs  Lab 04/15/24 0320 04/16/24 0305 04/17/24 0331 04/18/24 0400 04/19/24 0305  NA 134* 136 138 138 138  K 3.9 3.6 4.2 3.9 4.8  CL 100 99 103 102 101  CO2 25 26 26 26 26   GLUCOSE 116* 141* 142* 179* 166*  BUN 18 16 17 19  29*  CREATININE 1.00 0.97 0.94 0.88 1.04*  CALCIUM 8.8* 8.8* 8.7* 9.2 9.2    CBC: Recent Labs  Lab 04/15/24 0320 04/16/24 0305 04/16/24 1907 04/16/24 2112 04/17/24 0331 04/18/24 0400 04/19/24 0305  WBC 5.2 7.4  --   --  8.2 7.2 7.9  HGB 10.7* 10.5* 10.9* 11.0* 10.1* 10.0* 9.3*  HCT 33.7* 32.7* 34.0* 34.8* 31.5* 31.2* 29.4*  MCV 86.4 86.1  --   --  86.5 87.9 88.3  PLT 254 248  --   --  232 227 221    LFT Recent Labs  Lab 04/13/24 0416  AST 19  ALT 8  ALKPHOS 57  BILITOT  1.3*  PROT 5.5*  ALBUMIN 3.0*     Antibiotics: Anti-infectives (From admission, onward)    None        DVT prophylaxis: SCDs  Code Status: DNR  Family Communication: Spoke with son on 10/ 20   CONSULTS  -IR   Subjective   Some burning in her lower abdomen about 45 minutes after she eats Wanting a suppository today   Objective    Physical Examination:    General: Appearance:     Overweight female in no acute distress   Positive bowel sounds  Lungs:     respirations unlabored  Heart:    Normal heart rate.   MS:   All extremities are intact.         Status is: Inpatient:     [name] Bowl   [facility] If 7PM-7AM, please contact night-coverage at [email or web address], Office  [phone]   04/19/2024, 11:40 AM  LOS: 6 days

## 2024-04-19 NOTE — Progress Notes (Signed)
 Referring Physician(s): Established patient  Supervising Physician: Vanice Revel  Patient Status:  [facility] - In-pt  Chief Complaint:  Mesenteric ischemia s/p SMA stent 04/12/24 (Right CFA access) and celiac artery stent 04/15/24 (Left CFA access). 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 Right pseudoaneurysm was injected with thrombin 04/15/24 during celiac artery stent placement procedure.   Subjective:  Today, patient reports resolution of L abd pain and improvement of R abd pain (decreased from 8/10 to 5/10, burning in nature) and improvement in burning sensation in R groin. L groin is pain free. She continues to be constipated and experience mobility limitations due to pain though at time of exam, she expresses feeling motivated to participate in PT.   Allergies: Statins, Phenergan [promethazine hcl], Hydrocortisone, Lisinopril, Prednisone , and Trazodone  and nefazodone  Medications: Prior to Admission medications   Medication Sig Start Date End Date Taking? Authorizing Provider  acetaminophen  (TYLENOL ) 325 MG tablet TAKE (2) TABLETS BY MOUTH EVERY SIX HOURS AS NEEDED FOR PAIN. MAX 3GM IN 24 HOURS. 07/28/23  Yes Luking, Scott A, MD  albuterol  (VENTOLIN  HFA) 108 (90 Base) MCG/ACT inhaler INHALE 2 PUFFS INTO THE LUNGS EVERY SIX HOURS AS NEEDED FOR WHEEZING OR SHORTNESS OF BREATH. 09/26/23  Yes Luking, Glendia LABOR, MD  ALPRAZolam  (XANAX ) 0.25 MG tablet Take 1 tablet (0.25 mg total) by mouth at bedtime. 04/02/24  Yes Danford, Lonni SQUIBB, MD  amLODipine  (NORVASC ) 10 MG tablet TAKE (1) TABLET BY MOUTH ONCE DAILY. 10/07/23  Yes Luking, Glendia LABOR, MD  aspirin  (ASPIRIN  LOW DOSE) 81 MG chewable tablet CHEW (1) TABLET BY MOUTH ONCE DAILY. 09/06/23  Yes Luking, Glendia LABOR, MD  BETA CAROTENE PROVITAMIN A 25000 units capsule TAKE (1) CAPSULE BY MOUTH AT BEDTIME. 02/24/24  Yes Luking, Scott A, MD  BISACODYL  5 MG EC tablet TAKE 1 TABLET BY MOUTH ONCE DAILY AS NEEDED FOR  MODERATE CONSTIPATION. 12/20/22  Yes Luking, Glendia LABOR, MD  cyanocobalamin  1000 MCG tablet Take 1,000 mcg by mouth at bedtime.   Yes [provider]  cycloSPORINE  (RESTASIS ) 0.05 % ophthalmic emulsion Place 1 drop into both eyes 2 (two) times daily.   Yes [provider]  estradiol  (ESTRACE ) 0.1 MG/GM vaginal cream Discard plastic applicator. Insert a blueberry size amount (approximately 1 gram) of cream on fingertip inside vagina at bedtime every night for 1 week then every other night. For long term use. 01/15/24  Yes Larocco, Lauraine BROCKS, FNP  gabapentin  (NEURONTIN ) 100 MG capsule 1 qam for neuropath- new direction Patient taking differently: Take 100 mg by mouth at bedtime. 04/05/24  Yes [name], MD  hydrALAZINE  (APRESOLINE ) 25 MG tablet Take 1 tablet (25 mg total) by mouth 3 (three) times daily. 12/31/23  Yes Luking, Glendia LABOR, MD  IVIZIA DRY EYES 0.5 % SOLN Place 1 drop into both eyes 2 (two) times daily. 09/22/23  Yes [provider]  Melatonin 10 MG TABS Take 10 mg by mouth at bedtime. 03/24/24  Yes Grooms, Courtney, PA-C  metFORMIN  (GLUCOPHAGE ) 500 MG tablet TAKE (1) TABLET BY MOUTH IN THE MORNING & (1/2) TABLET (250MG ) BY MOUTH AT SUPPER 10/07/23  Yes Luking, Glendia LABOR, MD  methenamine  (HIPREX ) 1 g tablet Take 1 tablet (1 g total) by mouth 2 (two) times daily with a meal. Most effective when taken with a daily Vitamin C supplement. 01/15/24  Yes Gerldine Lauraine BROCKS, FNP  Multiple Vitamins-Minerals (PRESERVISION AREDS 2) CAPS Take 1 capsule by mouth in the morning and at  bedtime.   Yes [provider]  mupirocin  ointment (BACTROBAN ) 2 % Apply thin amount twice daily to rash in groin creases twice daily for 5 days at a time when rash occurs 03/07/24  Yes Luking, Glendia LABOR, MD  ondansetron  (ZOFRAN -ODT) 4 MG disintegrating tablet 4mg  ODT q4 hours prn nausea/vomit Patient taking differently: Take 4 mg by mouth every 4 (four) hours as needed for nausea, vomiting or refractory  nausea / vomiting. 09/05/23  Yes Zammit, Joseph, MD  oxyCODONE -acetaminophen  (PERCOCET/ROXICET) 5-325 MG tablet Take 1 tablet by mouth every 4 (four) hours as needed for moderate pain (pain score 4-6) or severe pain (pain score 7-10). 04/02/24  Yes Danford, Lonni SQUIBB, MD  pantoprazole  (PROTONIX ) 40 MG tablet Take 1 tablet (40 mg total) by mouth 2 (two) times daily before a meal. 12/24/23  Yes Cook, Jayce G, DO  polyethylene glycol powder (GOODSENSE CLEARLAX) 17 GM/SCOOP powder 1 scoop in 8 ounces water  once daily as needed for constipation 06/03/23  Yes Luking, Scott A, MD  senna-docusate (SENOKOT-S) 8.6-50 MG tablet Take 1 tablet by mouth at bedtime. 04/02/24  Yes Danford, Lonni SQUIBB, MD  tamsulosin  (FLOMAX ) 0.4 MG CAPS capsule Take 1 capsule (0.4 mg total) by mouth daily. 03/08/24  Yes Dahlstedt, Garnette, MD  valsartan  (DIOVAN ) 320 MG tablet Take 1 tablet (320 mg total) by mouth daily. 12/22/23  Yes Luking, Glendia LABOR, MD  Vitamin D , Cholecalciferol , 10 MCG (400 UNIT) TABS TAKE (1) TABLET BY MOUTH ONCE DAILY. 10/07/23  Yes Luking, Scott A, MD  glucose blood (EASYMAX TEST) test strip CHECK BLOOD SUGAR ONCE DAILY.(CALL MD IF BS BELOW 60: OR IF BS ABOVE 400) 10/07/23   Luking, Glendia LABOR, MD  ketoconazole  (NIZORAL ) 2 % cream Apply 1 Application topically 2 (two) times daily. For 10 days to groin rash Patient not taking: Reported on 04/13/2024 03/03/24   [name], MD  METAMUCIL 4 IN 1 FIBER 25 % PACK Take by mouth 2 (two) times daily. Patient not taking: Reported on 04/13/2024 03/25/24   [provider]  Safety Lancets 28G MISC CHECK BLOOD SUGAR ONCE DAILY. 01/21/24   [name], MD  sodium chloride  1 g tablet TAKE (1) TABLET BY MOUTH TWICE DAILY WITH A MEAL. Patient not taking: Reported on 04/13/2024 01/26/24   [name], MD     Vital Signs: BP (!) 149/51 (BP Location: Right Arm)   Pulse 94   Temp 98.8 F (37.1 C) (Oral)   Resp 18   Ht 5' 8 (1.727 m)   Wt 166 lb (75.3 kg)   SpO2  100%   BMI 25.24 kg/m   Physical Exam Pulmonary:     Effort: Pulmonary effort is normal.  Abdominal:     Palpations: Abdomen is soft.     Tenderness: There is abdominal tenderness in the right lower quadrant.  Skin:    General: Skin is warm and dry.     Comments: L groin site: unremarkable (soft, NT, no ecchymosis) R groin site: soft, minimally palpable mass non-pulsatile at puncture site, no ecchymosis, palpation is not tender though she does endorse constant burning sensation   Neurological:     General: No focal deficit present.     Mental Status: She is alert and oriented to person, place, and time.  Psychiatric:        Mood and Affect: Mood normal.        Behavior: Behavior normal.     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 Right thrombin injection x 2 and left thrombin injection x 1 by IR. Comparison Study: Prior right duplex to evaluate pseudoaneurysm done 04/16/2024.                   Prior left duplex to evaluate pseudoaneurysm done 04/17/2024. Performing Technologist: Alberta Lis RVS  Examination Guidelines: A complete evaluation includes B-mode imaging, spectral Doppler, color Doppler, and power Doppler as needed of all accessible portions of each vessel. Bilateral testing is considered an integral part of a complete examination. Limited examinations for reoccurring indications may be performed as noted.  Findings: An area with well defined  borders was visualized arising off of the CFA with ultrasound characteristics of a pseudoaneurysm. Mixed echos within the structure suggest that it is partially thrombosed with a residual diameter of 0.8 cm x 0.9 cm, status post  thrombin injection. Left pseudoaneurysm appears thrombosed status post thrombin injection.  Diagnosing physician: Lonni Gaskins MD Electronically signed by Lonni Gaskins MD on 04/18/2024 at 2:25:50 PM.   --------------------------------------------------------------------------------    Final    IR US  Guide Bx Asp/Drain Result Date: 04/17/2024 INDICATION: 89 year old female with a history of recent arterial interventions for suspected chronic mesenteric ischemia. CT arteriography demonstrates recurrence of the previously treated right common femoral artery pseudoaneurysm and a new large complex left common femoral artery pseudoaneurysm. The left side is symptomatic. EXAM: Ultrasound-guided thrombin injection right common femoral artery pseudoaneurysm Ultrasound-guided thrombin injection left common femoral pseudoaneurysm MEDICATIONS: A total of 600 mcg bovine thrombin injected intra-arterially ANESTHESIA/SEDATION: Moderate (conscious) sedation was employed during this procedure. A total of Versed  1 mg and Fentanyl  25 mcg was administered intravenously by the radiology nurse. Total intra-service moderate Sedation Time: 23 minutes. The patient's level of consciousness and vital signs were monitored continuously by radiology nursing throughout the procedure under my direct supervision. CONTRAST:  None. FLUOROSCOPY: None. COMPLICATIONS: None immediate. PROCEDURE: Informed consent was obtained from the patient following explanation of the procedure, risks, benefits and alternatives. The patient understands, agrees and consents for the procedure. All questions were addressed. A time out was performed prior to the initiation of the procedure. Maximal barrier sterile technique utilized  including caps, mask, sterile gowns, sterile gloves, large sterile drape, hand hygiene, and Betadine  prep. The right groin was interrogated with ultrasound. A partially thrombosed but still patent pseudoaneurysm is present arising hive from the common femoral artery. The pseudoaneurysm measures approximately 2.2 x 1.2 cm. A suitable skin entry site was selected and marked. Local anesthesia was attained by infiltration with 1% lidocaine . A 21 gauge micro puncture was carefully advanced into the lumen of the pseudoaneurysm. Bovine thrombin was then injected in 100 mcg aliquots in till the pseudoaneurysm was completely thrombosed. A total of 300 mcg was injected. Attention was turned to the left groin. The left groin was interrogated with ultrasound. A large complex pseudoaneurysm measuring approximately 2.5 x 1.7 cm is successfully identified. A suitable skin entry site was selected and marked. Local anesthesia was attained  by infiltration with 1% lidocaine . Under real-time ultrasound guidance, a 21 gauge micropuncture needle was carefully advanced into the pseudoaneurysm. Using similar technique, bovine thrombin was injected in 100 mcg aliquots until the pseudoaneurysm was completely thrombosed. A total of 300 mcg was injected. IMPRESSION: 1. Successful ultrasound-guided thrombin injection of right common femoral artery pseudoaneurysm. 2. Successful ultrasound-guided thrombin injection of left common femoral artery pseudoaneurysm. 3. Recommend bed rest x6 hours. Duplex ultrasound evaluation to be performed in the morning to assess for durability of treatment. Electronically Signed   By: Wilkie Lent M.D.   On: 04/17/2024 20:29   IR US  Guide Bx Asp/Drain Result Date: 04/17/2024 INDICATION: 89 year old female with a history of recent arterial interventions for suspected chronic mesenteric ischemia. CT arteriography demonstrates recurrence of the previously treated right common femoral artery pseudoaneurysm and  a new large complex left common femoral artery pseudoaneurysm. The left side is symptomatic. EXAM: Ultrasound-guided thrombin injection right common femoral artery pseudoaneurysm Ultrasound-guided thrombin injection left common femoral pseudoaneurysm MEDICATIONS: A total of 600 mcg bovine thrombin injected intra-arterially ANESTHESIA/SEDATION: Moderate (conscious) sedation was employed during this procedure. A total of Versed  1 mg and Fentanyl  25 mcg was administered intravenously by the radiology nurse. Total intra-service moderate Sedation Time: 23 minutes. The patient's level of consciousness and vital signs were monitored continuously by radiology nursing throughout the procedure under my direct supervision. CONTRAST:  None. FLUOROSCOPY: None. COMPLICATIONS: None immediate. PROCEDURE: Informed consent was obtained from the patient following explanation of the procedure, risks, benefits and alternatives. The patient understands, agrees and consents for the procedure. All questions were addressed. A time out was performed prior to the initiation of the procedure. Maximal barrier sterile technique utilized including caps, mask, sterile gowns, sterile gloves, large sterile drape, hand hygiene, and Betadine  prep. The right groin was interrogated with ultrasound. A partially thrombosed but still patent pseudoaneurysm is present arising hive from the common femoral artery. The pseudoaneurysm measures approximately 2.2 x 1.2 cm. A suitable skin entry site was selected and marked. Local anesthesia was attained by infiltration with 1% lidocaine . A 21 gauge micro puncture was carefully advanced into the lumen of the pseudoaneurysm. Bovine thrombin was then injected in 100 mcg aliquots in till the pseudoaneurysm was completely thrombosed. A total of 300 mcg was injected. Attention was turned to the left groin. The left groin was interrogated with ultrasound. A large complex pseudoaneurysm measuring approximately 2.5 x 1.7  cm is successfully identified. A suitable skin entry site was selected and marked. Local anesthesia was attained by infiltration with 1% lidocaine . Under real-time ultrasound guidance, a 21 gauge micropuncture needle was carefully advanced into the pseudoaneurysm. Using similar technique, bovine thrombin was injected in 100 mcg aliquots until the pseudoaneurysm was completely thrombosed. A total of 300 mcg was injected. IMPRESSION: 1. Successful ultrasound-guided thrombin injection of right common femoral artery pseudoaneurysm. 2. Successful ultrasound-guided thrombin injection of left common femoral artery pseudoaneurysm. 3. Recommend bed rest x6 hours. Duplex ultrasound evaluation to be performed in the morning to assess for durability of treatment. Electronically Signed   By: Wilkie Lent M.D.   On: 04/17/2024 20:29   VAS US  GROIN PSEUDOANEURYSM Result Date: 04/17/2024  ARTERIAL PSEUDOANEURYSM  Patient Name:  NATESHA HASSEY  Date of Exam:   04/17/2024 Medical Rec #: [id number]        Accession #:    [id number] Date of Birth: 04-27-1932        Patient Gender: F Patient Age:   76 years Exam Location:  [facility] Procedure:      VAS US  GROIN PSEUDOANEURYSM Referring Phys: JESSICA VANN --------------------------------------------------------------------------------  Exam: Left groin Indications: Patient complains of Pseudoaneurysm noted by CTA. History: New abdominal/back pain, stat CTA indicates large left inguinal pseudoaneurysm with adjacent hemorrhage/hematoma Celiac artery stent placement 04/15/24. Partially thrombosed pseudoaneurysm noted in right groin 04/16/24. Comparison       Prior right groin ultrasound post thrombin injection done Study:           04/16/24 Performing Technologist: Alberta Lis RVS  Examination Guidelines: A complete evaluation includes B-mode imaging, spectral Doppler, color Doppler, and power Doppler as needed of all accessible portions of each vessel. Bilateral  testing is considered an integral part of a complete examination. Limited examinations for reoccurring indications may be performed as noted.  Findings: An area with well defined borders measuring 2.2 cm x 1.8 cm was visualized arising off of the Common femoral artery with ultrasound characteristics of a pseudoaneurysm.  Summary: Pseudoaneurysm noted in left groin.  Diagnosing physician: Lonni Gaskins MD Electronically signed by Lonni Gaskins MD on 04/17/2024 at 1:18:12 PM.   --------------------------------------------------------------------------------    Final    CTA ABDOMEN AND PELVIS WITH IV CONTRAST 04/16/2024 05:48:46 PM TECHNIQUE: CTA images of the abdomen and pelvis with intravenous contrast. Three-dimensional MIP/volume rendered formations were performed. Automated exposure control, iterative reconstruction, and/or weight based adjustment of the mA/kV was utilized to reduce the radiation dose to as low as reasonably achievable. COMPARISON: CT 03/28/2024. CLINICAL HISTORY: Retroperitoneal bleed suspected. Mesenteric ischemia. Abdominal pain. FINDINGS: VASCULATURE: AORTA: Advanced aortic atherosclerotic calcification. No abdominal aortic aneurysm. No dissection. CELIAC TRUNK: Since CT 03/28/2024 there has been placement of a celiac axis stent which is patent. SUPERIOR MESENTERIC ARTERY: The SMA stent is patent. RENAL ARTERIES: Severe narrowing of both renal arteries at their origins. ILIAC ARTERIES: No hemodynamically significant stenosis in the bilateral common or external iliac arteries. OTHER VASCULATURE: The bilateral common femoral and visualized portions of the superficial femoral and profunda femoral arteries are patent. Multilobed focus of extraluminal contrast in the left inguinal region originating from the left common femoral artery. This does not substantially change between arterial and venous phases and is compatible with a large pseudoaneurysm measuring 2.6 x 1.4 x 3.3 cm. The neck  of the pseudoaneurysm measures approximately 3 mm. Adjacent high-density fluid compatible with hemorrhage / hematoma. There is an additional right common femoral artery pseudoaneurysm with peripheral crescentic thrombus; this measures 1.9 x 1.3 x 1.8 cm. LIVER: The liver is unremarkable. GALLBLADDER AND BILE DUCTS: Cholelithiasis. No biliary ductal dilatation. SPLEEN: The spleen is unremarkable. PANCREAS: The pancreas is unremarkable. ADRENAL GLANDS: Bilateral adrenal glands demonstrate no acute abnormality. KIDNEYS, URETERS AND BLADDER: No stones in the kidneys or ureters. No hydronephrosis. No perinephric or periureteral stranding. Urinary bladder is unremarkable. GI AND BOWEL: Stomach and duodenal sweep demonstrate no acute abnormality. There is no bowel obstruction. No abnormal bowel wall thickening or distension. REPRODUCTIVE: Reproductive organs are unremarkable. PERITONEUM AND RETROPERITONEUM: No ascites or free air. LUNG BASE: No acute abnormality. LYMPH NODES: No lymphadenopathy. BONES AND SOFT TISSUES: No acute abnormality of the bones. No acute soft tissue abnormality. IMPRESSION: 1. Large left inguinal pseudoaneurysm with adjacent hemorrhage/hematoma. 2. 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 Critical value/emergent results were called by telephone at the time of interpretation on date at time to provider , who verbally acknowledged these results. Electronically signed by: Norman Gatlin MD 04/16/2024 06:24 PM EDT RP Workstation: [id number]   CT ABDOMEN  PELVIS WO CONTRAST Result Date: 04/16/2024 EXAM: CT ABDOMEN AND PELVIS WITHOUT CONTRAST 04/16/2024 05:31:02 PM TECHNIQUE: CT of the abdomen and pelvis was performed without the administration of intravenous contrast. Multiplanar reformatted images are provided for review. Automated exposure control, iterative reconstruction, and/or weight-based adjustment of the mA/kV was utilized to reduce the radiation dose to as  low as reasonably achievable. COMPARISON: CT abdomen and pelvis 03/28/2024. CLINICAL HISTORY: Retroperitoneal bleed suspected. Mesenteric ischemia. Abdominal Pain. FINDINGS: LOWER CHEST: No acute abnormality. LIVER: The liver is unremarkable. GALLBLADDER AND BILE DUCTS: Cholelithiasis without evidence of acute cholecystitis. No biliary ductal dilatation. SPLEEN: No acute abnormality. PANCREAS: No acute abnormality. ADRENAL GLANDS: No acute abnormality. KIDNEYS, URETERS AND BLADDER: No stones in the kidneys or ureters. No hydronephrosis. No perinephric or periureteral stranding. Urinary bladder is unremarkable. GI AND BOWEL: Stomach demonstrates no acute abnormality. There is no bowel obstruction. PERITONEUM AND RETROPERITONEUM: No ascites. No free air. VASCULATURE: Aorta is normal in caliber. Aortic atherosclerotic calcification. Interval placement of a celiac artery stent. Re-demonstrated SMA stent. Patency is not well evaluated without IV contrast. LYMPH NODES: No lymphadenopathy. REPRODUCTIVE ORGANS: No acute abnormality. BONES AND SOFT TISSUES: Ventral abdominal wall hernia repair. High density fluid and edema in the left inguinal region better evaluated on subsequent CT abdomen and pelvis with IV contrast. See that report for details. No acute osseous abnormality. IMPRESSION: 1. High-density fluid and edema in the left inguinal region; better evaluated on the subsequent contrast-enhanced CT abdomen and pelvis; refer to that report for details. Electronically signed by: Norman Gatlin MD 04/16/2024 06:24 PM EDT RP Workstation: [id number]   VAS US  GROIN PSEUDOANEURYSM Result Date: 04/16/2024  ARTERIAL PSEUDOANEURYSM  Patient Name:  TIPHANY FAYSON  Date of Exam:   04/16/2024 Medical Rec #: [id number]        Accession #:    [id number] Date of Birth: 12/18/1931        Patient Gender: F Patient Age:   22 years Exam Location:  [facility] Procedure:      VAS US  QUILLIAN CAPES Referring Phys: CARLIN GRIFFON --------------------------------------------------------------------------------  Exam: Right groin Indications: Patient complains of 1 day post thrombin injection by IR. History: Status post celiac artery stenting with IR 04/15/2024. Comparison Study: No prior study on file Performing Technologist: Alberta Lis RVS  Examination Guidelines: A complete evaluation includes B-mode imaging, spectral Doppler, color Doppler, and power Doppler as needed of all accessible portions of each vessel. Bilateral testing is considered an integral part of a complete examination. Limited examinations for reoccurring indications may be performed as noted.  Findings: An area with well defined borders measuring 2.0 cm x 2.2 cm was visualized arising off of the CFA with ultrasound characteristics of a pseudoaneurysm.  Summary: Pseudoaneurysm appears partially thrombosed with active neck off the common femoral artery. Diagnosing physician: Lonni Gaskins MD Electronically signed by Lonni Gaskins MD on 04/16/2024 at 1:09:53 PM.   --------------------------------------------------------------------------------    Final     Labs:  CBC: Recent Labs    04/16/24 0305 04/16/24 1907 04/16/24 2112 04/17/24 0331 04/18/24 0400 04/19/24 0305  WBC 7.4  --   --  8.2 7.2 7.9  HGB 10.5*   < > 11.0* 10.1* 10.0* 9.3*  HCT 32.7*   < > 34.8* 31.5* 31.2* 29.4*  PLT 248  --   --  232 227 221   < > = values in this interval not displayed.    COAGS: Recent Labs    03/28/24 1434  INR 1.0  BMP: Recent Labs    04/16/24 0305 04/17/24 0331 04/18/24 0400 04/19/24 0305  NA 136 138 138 138  K 3.6 4.2 3.9 4.8  CL 99 103 102 101  CO2 26 26 26 26   GLUCOSE 141* 142* 179* 166*  BUN 16 17 19  29*  CALCIUM 8.8* 8.7* 9.2 9.2  CREATININE 0.97 0.94 0.88 1.04*  GFRNONAA 56* 58* >60 51*    LIVER FUNCTION TESTS: Recent Labs    03/31/24 0521 04/01/24 0246 04/12/24 1120 04/13/24 0416  BILITOT 0.6 0.9 1.1 1.3*  AST  16 15 17 19   ALT 12 13 11 8   ALKPHOS 73 64 72 57  PROT 6.1* 5.9* 6.6 5.5*  ALBUMIN 3.2* 3.2* 3.8 3.0*    Assessment and Plan:  The right pseudoaneurysm is still open but is smaller per recent US . 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 This site has now been treated twice and if the thrombin injections haven't been completely effective it's likely because it is well formed and has been chronic.   Order for 1 mo f/u in OP clinic with Dr. Jennefer already in place per previous IR note.   No additional concerns at this time with labs, vitals, exam as of time of bedside rounding at 11am.   Electronically Signed: Laymon Coast, NP 04/19/2024, 4:39 PM   I spent a total of 15 Minutes at the the patient's bedside AND on the patient's hospital floor or unit, greater than 50% of which was counseling/coordinating care for s/p celiac stent and thrombin injection.

## 2024-04-20 ENCOUNTER — Ambulatory Visit (INDEPENDENT_AMBULATORY_CARE_PROVIDER_SITE_OTHER): Admitting: Gastroenterology

## 2024-04-20 DIAGNOSIS — K559 Vascular disorder of intestine, unspecified: Secondary | ICD-10-CM | POA: Diagnosis not present

## 2024-04-20 LAB — GLUCOSE, CAPILLARY
Glucose-Capillary: 165 mg/dL — ABNORMAL HIGH (ref 70–99)
Glucose-Capillary: 161 mg/dL — ABNORMAL HIGH (ref 70–99)
Glucose-Capillary: 181 mg/dL — ABNORMAL HIGH (ref 70–99)
Glucose-Capillary: 186 mg/dL — ABNORMAL HIGH (ref 70–99)
Glucose-Capillary: 177 mg/dL — ABNORMAL HIGH (ref 70–99)
Glucose-Capillary: 203 mg/dL — ABNORMAL HIGH (ref 70–99)

## 2024-04-20 LAB — CBC
HCT: 28.4 % — ABNORMAL LOW (ref 36.0–46.0)
Hemoglobin: 9.1 g/dL — ABNORMAL LOW (ref 12.0–15.0)
MCH: 27.9 pg (ref 26.0–34.0)
MCHC: 32 g/dL (ref 30.0–36.0)
MCV: 87.1 fL (ref 80.0–100.0)
Platelets: 219 K/uL (ref 150–400)
RBC: 3.26 MIL/uL — ABNORMAL LOW (ref 3.87–5.11)
RDW: 15.2 % (ref 11.5–15.5)
WBC: 7.8 K/uL (ref 4.0–10.5)
nRBC: 0 % (ref 0.0–0.2)

## 2024-04-20 LAB — BASIC METABOLIC PANEL WITH GFR
Anion gap: 8 (ref 5–15)
BUN: 23 mg/dL (ref 8–23)
CO2: 28 mmol/L (ref 22–32)
Calcium: 8.9 mg/dL (ref 8.9–10.3)
Chloride: 100 mmol/L (ref 98–111)
Creatinine, Ser: 0.96 mg/dL (ref 0.44–1.00)
GFR, Estimated: 56 mL/min — ABNORMAL LOW (ref >60)
Glucose, Bld: 158 mg/dL — ABNORMAL HIGH (ref 70–99)
Potassium: 4.1 mmol/L (ref 3.5–5.1)
Sodium: 136 mmol/L (ref 135–145)

## 2024-04-20 NOTE — Progress Notes (Signed)
 [facility]  PROGRESS NOTE  LEVA BAINE FMW:[id number] DOB: May 28, 1932 DOA: 04/12/2024 PCP: [name], MD   Brief HPI:    89 y.o. 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  At that time she was seen by IR and vascular surgery and they had deemed that stable celiac artery stenosis is not the cause of her symptoms.  Plan was to follow-up with GI as outpatient. Today patient went for follow-up to IR clinic, she continued to complain of abdominal pain which she described as burning. IR sent her to the ED for admission and stent placement in the celiac artery for celiac artery stenosis. Hospital stay complicated by pseudoaneurysms at access site.  Slowly improving, will need skilled nursing facility placement.  TOC consulted and workup in process.    Assessment/Plan:  Mesenteric ischemia -Superior mesenteric artery stent - Complicated by 2 pseudoaneurysms status post bilateral ultrasound-guided thrombin injection into the common femoral artery pseudoaneurysms (right appears chronic, the other acute) 10/19: IR: the right is still open but even smaller.  Would not do anything more- had that for a year and its not symptomatic.  It has been injected  2x just b/c its there and it wont take, probably because its so chronic and well formed. The left is fixed. She should be fine for PT.  -Pain control -Daily labs  Diabetes mellitus type 2 - Metformin  on hold, continue sliding scale insulin  NovoLog  - CBG well-controlled  Hypertension - Resume blood pressure meds   Generalized anxiety disorder - Continue Xanax   PT evaluation-SNF placement   Medications     ALPRAZolam   0.25 mg Oral  QHS   amLODipine   10 mg Oral Daily   aspirin   81 mg Oral Daily   cycloSPORINE   1 drop Both Eyes BID   feeding supplement  237 mL Oral BID BM   gabapentin   100 mg Oral Daily   insulin  aspart  0-9 Units Subcutaneous TID WC   lactobacillus  1 g Oral TID WC   lidocaine   1 patch Transdermal Q24H   pantoprazole   40 mg Oral BID AC   polyethylene glycol  17 g Oral Daily   SMOG  960 mL Rectal Once   tamsulosin   0.4 mg Oral Daily   thrombin 5,000 units for percutaneous treatment of pseudoaneurysm   Percutaneous Once     Data Reviewed:   CBG:  Recent Labs  Lab 04/19/24 1117 04/19/24 1636 04/19/24 2100 04/20/24 0608 04/20/24 0917  GLUCAP 170* 178* 190* 165* 161*    SpO2: 91 % O2 Flow Rate (L/min): 2 L/min    Vitals:   04/19/24 2000 04/19/24 2315 04/20/24 0306 04/20/24 0817  BP:  (!) 146/45 (!) 160/57 (!) 143/47  Pulse: 69 67 74 78  Resp: (!) 21 20 19 15   Temp:  98.5 F (36.9 C) 98.7 F (37.1 C) 98.6 F (37 C)  TempSrc:  Oral Oral Oral  SpO2: 96% 94% 91%   Weight:      Height:          Data Reviewed:  Basic Metabolic Panel: Recent Labs  Lab 04/16/24 0305 04/17/24 0331 04/18/24 0400 04/19/24 0305 04/20/24 0323  NA 136 138 138 138 136  K 3.6 4.2 3.9 4.8  4.1  CL 99 103 102 101 100  CO2 26 26 26 26 28   GLUCOSE 141* 142* 179* 166* 158*  BUN 16 17 19  29* 23  CREATININE 0.97 0.94 0.88 1.04* 0.96  CALCIUM 8.8* 8.7* 9.2 9.2 8.9    CBC: Recent Labs  Lab 04/16/24 0305 04/16/24 1907 04/16/24 2112 04/17/24 0331 04/18/24 0400 04/19/24 0305 04/20/24 0323  WBC 7.4  --   --  8.2 7.2 7.9 7.8  HGB 10.5*   < > 11.0* 10.1* 10.0* 9.3* 9.1*  HCT 32.7*   < > 34.8* 31.5* 31.2* 29.4* 28.4*  MCV 86.1  --   --  86.5 87.9 88.3 87.1  PLT 248  --   --  232 227 221 219   < > = values in this interval not displayed.    LFT No results for input(s): AST, ALT, ALKPHOS, BILITOT, PROT, ALBUMIN in the last 168 hours.    Antibiotics: Anti-infectives (From  admission, onward)    None        DVT prophylaxis: SCDs  Code Status: DNR  Family Communication: Spoke with son on 10/ 20   CONSULTS  -IR   Subjective   Positive bowel movement yesterday, still has a relatively poor appetite Agreeable to rehab   Objective    Physical Examination:    General: Appearance:     Overweight female in no acute distress   Positive bowel sounds, mildly distended  Lungs:     respirations unlabored  Heart:    Normal heart rate.   MS:   All extremities are intact.         Status is: Inpatient:     [name] Bowl   [facility] If 7PM-7AM, please contact night-coverage at [email or web address], Office  [phone]   04/20/2024, 11:36 AM  LOS: 7 days

## 2024-04-20 NOTE — NC FL2 (Signed)
 [place]  MEDICAID FL2 LEVEL OF CARE FORM     IDENTIFICATION  Patient Name: [name] Birthdate: Nov 21, 1931 Sex: female Admission Date (Current Location): 04/12/2024  [facility] and [place] Number:  Producer, television/film/video and Address:  [facility] [place], [address] [place], [place] [address]      Provider Number: [id number]  Attending Physician Name and Address:  Juvenal Harlene PENNER, DO  Relative Name and Phone Number:       Current Level of Care: Hospital Recommended Level of Care: Skilled Nursing Facility Prior Approval Number:    Date Approved/Denied:   PASRR Number: [id number]  Discharge Plan: SNF    Current Diagnoses: Patient Active Problem List   Diagnosis Date Noted   Mesenteric ischemia 04/12/2024   DNR (do not resuscitate) discussion 04/01/2024   Occlusive mesenteric ischemia 03/31/2024   Palliative care by specialist 03/29/2024   DNR (do not resuscitate) 03/29/2024   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   Skin rash 03/24/2024   Lesion of nose 03/24/2024   History of Clostridioides difficile colitis 02/18/2024   Overflow diarrhea 02/18/2024   Abnormal findings on diagnostic imaging of other specified body structures 02/18/2024   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/15/2024   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 01/15/2024   Recurrent UTI 01/15/2024   Type 2 diabetes mellitus with hyperglycemia (HCC) 12/28/2023   UTI (urinary tract infection) 12/27/2023   Frequency of urination 12/24/2023   Acute blood loss anemia 04/17/2023   Gastritis and gastroduodenitis 04/10/2023   Abdominal pain, chronic, epigastric 04/08/2023   Mesenteric artery stenosis 04/07/2023   Diabetic neuropathy (HCC) 11/30/2022   S/P total knee arthroplasty, right 04/12/2022   Aortic atherosclerosis 02/25/2022   Chronic non-seasonal allergic rhinitis 02/25/2022   GERD (gastroesophageal reflux disease) 02/17/2022    Myalgia due to statin 02/15/2020   Cognitive dysfunction 03/04/2019   Generalized anxiety disorder 06/05/2018   Hyponatremia 03/08/2018   Osteoarthritis of right knee 12/02/2017   Hyperlipidemia associated with type 2 diabetes mellitus (HCC) 10/02/2016   Insomnia 10/02/2016   Essential hypertension 12/15/2014   Type 2 diabetes mellitus with atherosclerosis of aorta (HCC) 02/15/2011    Orientation RESPIRATION BLADDER Height & Weight     Self, Time, Situation, Place  Normal Continent Weight: 166 lb (75.3 kg) Height:  5' 8 (172.7 cm)  BEHAVIORAL SYMPTOMS/MOOD NEUROLOGICAL BOWEL NUTRITION STATUS      Continent Diet (see d/c summary)  AMBULATORY STATUS COMMUNICATION OF NEEDS Skin   Extensive Assist Verbally Normal                       Personal Care Assistance Level of Assistance  Bathing, Feeding, Dressing Bathing Assistance: Maximum assistance Feeding assistance: Independent Dressing Assistance: Limited assistance     Functional Limitations Info  Sight, Hearing, Speech Sight Info: Impaired (eye glasses) Hearing Info: Adequate Speech Info: Adequate    SPECIAL CARE FACTORS FREQUENCY  OT (By licensed OT), PT (By licensed PT)     PT Frequency: 5x/week OT Frequency: 5x/week            Contractures Contractures Info: Not present    Additional Factors Info  Code Status, Allergies Code Status Info: DNR-limited Allergies Info: Statins; Phenergan (Promethazine Hcl); Hydrocortisone; :isinopril; Prednisone ; Trazodone  & Nefazodone           Current Medications (04/20/2024):  This is the current hospital active medication list Current Facility-Administered Medications  Medication Dose Route Frequency Provider Last Rate  Last Admin   acetaminophen  (TYLENOL ) tablet 650 mg  650 mg Oral Q6H PRN Lama, Gagan S, MD   650 mg at 04/16/24 0836   albuterol  (PROVENTIL ) (2.5 MG/3ML) 0.083% nebulizer solution 2.5 mg  2.5 mg Inhalation Q6H PRN Drusilla Sabas RAMAN, MD       ALPRAZolam   (XANAX ) tablet 0.25 mg  0.25 mg Oral QHS Drusilla, Gagan S, MD   0.25 mg at 04/19/24 2056   alum & mag hydroxide-simeth (MAALOX/MYLANTA) 200-200-20 MG/5ML suspension 15 mL  15 mL Oral Q6H PRN Lama, Gagan S, MD   15 mL at 04/13/24 2141   amLODipine  (NORVASC ) tablet 10 mg  10 mg Oral Daily Vann, Jessica U, DO   10 mg at 04/20/24 9058   aspirin  chewable tablet 81 mg  81 mg Oral Daily Lama, Gagan S, MD   81 mg at 04/20/24 0941   bisacodyl  (DULCOLAX) suppository 10 mg  10 mg Rectal Daily PRN Vann, Jessica U, DO   10 mg at 04/19/24 9160   cycloSPORINE  (RESTASIS ) 0.05 % ophthalmic emulsion 1 drop  1 drop Both Eyes BID Drusilla Sabas RAMAN, MD   1 drop at 04/20/24 0936   feeding supplement (ENSURE PLUS HIGH PROTEIN) liquid 237 mL  237 mL Oral BID BM Vann, Jessica U, DO   237 mL at 04/20/24 0940   gabapentin  (NEURONTIN ) capsule 100 mg  100 mg Oral Daily Lama, Gagan S, MD   100 mg at 04/20/24 0940   hydrALAZINE  (APRESOLINE ) tablet 25 mg  25 mg Oral Q6H PRN Lama, Gagan S, MD   25 mg at 04/15/24 1409   HYDROmorphone  (DILAUDID ) injection 0.5 mg  0.5 mg Intravenous Q4H PRN Shona Laurence N, DO   0.5 mg at 04/20/24 0948   insulin  aspart (novoLOG ) injection 0-9 Units  0-9 Units Subcutaneous TID WC Drusilla Sabas RAMAN, MD   2 Units at 04/20/24 1346   lactobacillus (FLORANEX/LACTINEX) granules 1 g  1 g Oral TID WC Vann, Jessica U, DO   1 g at 04/20/24 1347   lidocaine  (LIDODERM ) 5 % 1 patch  1 patch Transdermal Q24H Vann, Jessica U, DO   1 patch at 04/20/24 0932   ondansetron  (ZOFRAN ) tablet 4 mg  4 mg Oral Q6H PRN Drusilla Sabas RAMAN, MD       Or   ondansetron  (ZOFRAN ) injection 4 mg  4 mg Intravenous Q6H PRN Drusilla Sabas RAMAN, MD       pantoprazole  (PROTONIX ) EC tablet 40 mg  40 mg Oral BID AC Lama, Gagan S, MD   40 mg at 04/20/24 9060   polyethylene glycol (MIRALAX  / GLYCOLAX ) packet 17 g  17 g Oral Daily Vann, Jessica U, DO   17 g at 04/20/24 0942   senna (SENOKOT) tablet 17.2 mg  2 tablet Oral QHS PRN Vann, Jessica U, DO   17.2 mg at  04/17/24 2158   sorbitol, magnesium  hydroxide, mineral oil, glycerin (SMOG) enema  960 mL Rectal Once Vann, Jessica U, DO       tamsulosin  (FLOMAX ) capsule 0.4 mg  0.4 mg Oral Daily Lama, Gagan S, MD   0.4 mg at 04/20/24 0940   thrombin 5,000 units for percutaneous treatment of pseudoaneurysm   Percutaneous Once Karalee Wilkie POUR, MD       vitamin A & D ointment   Topical PRN Lama, Gagan S, MD   113 Application at 04/15/24 2121     Discharge Medications: Please see discharge summary for a list of discharge medications.  Relevant Imaging Results:  Relevant Lab Results:   Additional Information SSN: [id number]  Luann SHAUNNA Cumming, LCSW

## 2024-04-20 NOTE — Progress Notes (Signed)
 Mobility Specialist Progress Note:   04/20/24 1040  Mobility  Activity Ambulated with assistance  Level of Assistance Standby assist, set-up cues, supervision of patient - no hands on  Assistive Device Front wheel walker  Distance Ambulated (ft) 500 ft  Activity Response Tolerated well  Mobility Referral Yes  Mobility visit 1 Mobility  Mobility Specialist Start Time (ACUTE ONLY) 1040  Mobility Specialist Stop Time (ACUTE ONLY) 1100  Mobility Specialist Time Calculation (min) (ACUTE ONLY) 20 min   Pt agreeable to mobility session. Required no physical assistance throughout ambulation with RW. C/o neck pain, better with lidocaine  patch. No other c/o. Pt left sitting in chair with all needs met.   Therisa Rana Mobility Specialist Please contact via SecureChat or  Rehab office at [phone]

## 2024-04-20 NOTE — Plan of Care (Signed)

## 2024-04-20 NOTE — Plan of Care (Signed)
 progressing

## 2024-04-21 ENCOUNTER — Encounter: Admitting: Vascular Surgery

## 2024-04-21 DIAGNOSIS — K559 Vascular disorder of intestine, unspecified: Secondary | ICD-10-CM | POA: Diagnosis not present

## 2024-04-21 LAB — BASIC METABOLIC PANEL WITH GFR
Anion gap: 8 (ref 5–15)
BUN: 25 mg/dL — ABNORMAL HIGH (ref 8–23)
CO2: 27 mmol/L (ref 22–32)
Calcium: 9 mg/dL (ref 8.9–10.3)
Chloride: 101 mmol/L (ref 98–111)
Creatinine, Ser: 0.92 mg/dL (ref 0.44–1.00)
GFR, Estimated: 59 mL/min — ABNORMAL LOW (ref >60)
Glucose, Bld: 160 mg/dL — ABNORMAL HIGH (ref 70–99)
Potassium: 4.4 mmol/L (ref 3.5–5.1)
Sodium: 136 mmol/L (ref 135–145)

## 2024-04-21 LAB — CBC
HCT: 28.6 % — ABNORMAL LOW (ref 36.0–46.0)
Hemoglobin: 9 g/dL — ABNORMAL LOW (ref 12.0–15.0)
MCH: 27.6 pg (ref 26.0–34.0)
MCHC: 31.5 g/dL (ref 30.0–36.0)
MCV: 87.7 fL (ref 80.0–100.0)
Platelets: 240 K/uL (ref 150–400)
RBC: 3.26 MIL/uL — ABNORMAL LOW (ref 3.87–5.11)
RDW: 15 % (ref 11.5–15.5)
WBC: 8.2 K/uL (ref 4.0–10.5)
nRBC: 0 % (ref 0.0–0.2)

## 2024-04-21 LAB — GLUCOSE, CAPILLARY
Glucose-Capillary: 159 mg/dL — ABNORMAL HIGH (ref 70–99)
Glucose-Capillary: 135 mg/dL — ABNORMAL HIGH (ref 70–99)
Glucose-Capillary: 168 mg/dL — ABNORMAL HIGH (ref 70–99)
Glucose-Capillary: 162 mg/dL — ABNORMAL HIGH (ref 70–99)

## 2024-04-21 MED ORDER — KETOROLAC TROMETHAMINE 15 MG/ML IJ SOLN
15.0000 mg | Freq: Once | INTRAMUSCULAR | Status: AC
  Administered 2024-04-21: 15 mg via INTRAVENOUS
  Filled 2024-04-21: qty 1

## 2024-04-21 NOTE — Plan of Care (Signed)

## 2024-04-21 NOTE — Progress Notes (Signed)
 Mobility Specialist Progress Note:    04/21/24 1432  Mobility  Activity Ambulated with assistance  Level of Assistance Minimal assist, patient does 75% or more  Assistive Device Front wheel walker  Distance Ambulated (ft) 300 ft  Activity Response Tolerated well  Mobility Referral Yes  Mobility visit 1 Mobility  Mobility Specialist Start Time (ACUTE ONLY) 1432  Mobility Specialist Stop Time (ACUTE ONLY) 1445  Mobility Specialist Time Calculation (min) (ACUTE ONLY) 13 min   Pt received in chair, agreeable to mobility session. Ambulated in hallway with RW. Tolerated well, asx throughout. Returned to chair,all needs met.   Zykera Abella Mobility Specialist Please contact via Special educational needs teacher or  Rehab office at [phone]

## 2024-04-21 NOTE — Plan of Care (Signed)

## 2024-04-21 NOTE — TOC Initial Note (Signed)
 Transition of Care [facility]) - Initial/Assessment Note    Patient Details  Name: [name] MRN: [id number] Date of Birth: March 14, 1932  [facility] CM/SW Contact:    Montie LOISE Louder, LCSW Phone Number: 04/21/2024, 2:57 PM  Clinical Narrative:                  CSW met with patient- CSW introduced self and explained role. Patient states she is from [facility] . She states her ALF has contacted [name] and the plan is to discharge for rehab prior to returning to ALF.   CSW will follow up with SNF to confirmed discharge plan.  Montie Louder, MSW, LCSW Clinical Social Worker    Expected Discharge Plan: Skilled Nursing Facility Barriers to Discharge: Continued Medical Work up   Patient Goals and CMS Choice Patient states their goals for this hospitalization and ongoing recovery are:: Return to LTC          Expected Discharge Plan and Services In-house Referral: Clinical Social Work     Living arrangements for the past 2 months: Skilled Nursing Facility                                      Prior Living Arrangements/Services Living arrangements for the past 2 months: Skilled Nursing Facility Lives with:: Self Patient language and need for interpreter reviewed:: No Do you feel safe going back to the place where you live?: Yes      Need for Family Participation in Patient Care: Yes (Comment) Care giver support system in place?: Yes (comment)   Criminal Activity/Legal Involvement Pertinent to Current Situation/Hospitalization: No - Comment as needed  Activities of Daily Living   ADL Screening (condition at time of admission) Independently performs ADLs?: No Does the patient have a NEW difficulty with bathing/dressing/toileting/self-feeding that is expected to last >3 days?: No Does the patient have a NEW difficulty with getting in/out of bed, walking, or climbing stairs that is expected to last >3 days?: No Does the patient have a NEW  difficulty with communication that is expected to last >3 days?: No Is the patient deaf or have difficulty hearing?: No Does the patient have difficulty seeing, even when wearing glasses/contacts?: No Does the patient have difficulty concentrating, remembering, or making decisions?: No  Permission Sought/Granted Permission sought to share information with : Family Supports Permission granted to share information with : Yes, Verbal Permission Granted  Share Information with NAME: Lea Baine  Permission granted to share info w AGENCY: SNF  Permission granted to share info w Relationship: son  Permission granted to share info w Contact Information: [phone]  Emotional Assessment Appearance:: Appears stated age Attitude/Demeanor/Rapport: Engaged Affect (typically observed): Appropriate, Accepting, Pleasant Orientation: : Oriented to Self, Oriented to Place, Oriented to  Time, Oriented to Situation Alcohol  / Substance Use: Not Applicable Psych Involvement: No (comment)  Admission diagnosis:  Mesenteric artery stenosis [K55.1] Intractable abdominal pain [R10.9] Mesenteric ischemia [K55.9] Patient Active Problem List   Diagnosis Date Noted   Mesenteric ischemia 04/12/2024   DNR (do not resuscitate) discussion 04/01/2024   Occlusive mesenteric ischemia 03/31/2024   Palliative care by specialist 03/29/2024   DNR (do not resuscitate) 03/29/2024   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   Skin rash 03/24/2024   Lesion of nose 03/24/2024   History of Clostridioides difficile colitis 02/18/2024   Overflow diarrhea 02/18/2024   Abnormal findings on diagnostic  imaging of other specified body structures 02/18/2024   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/15/2024   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 01/15/2024   Recurrent UTI 01/15/2024   Type 2 diabetes mellitus with hyperglycemia (HCC) 12/28/2023   UTI (urinary tract infection) 12/27/2023    Frequency of urination 12/24/2023   Acute blood loss anemia 04/17/2023   Gastritis and gastroduodenitis 04/10/2023   Abdominal pain, chronic, epigastric 04/08/2023   Mesenteric artery stenosis 04/07/2023   Diabetic neuropathy (HCC) 11/30/2022   S/P total knee arthroplasty, right 04/12/2022   Aortic atherosclerosis 02/25/2022   Chronic non-seasonal allergic rhinitis 02/25/2022   GERD (gastroesophageal reflux disease) 02/17/2022   Myalgia due to statin 02/15/2020   Cognitive dysfunction 03/04/2019   Generalized anxiety disorder 06/05/2018   Hyponatremia 03/08/2018   Osteoarthritis of right knee 12/02/2017   Hyperlipidemia associated with type 2 diabetes mellitus (HCC) 10/02/2016   Insomnia 10/02/2016   Essential hypertension 12/15/2014   Type 2 diabetes mellitus with atherosclerosis of aorta (HCC) 02/15/2011   PCP:  [name], MD Pharmacy:   [facility], [place] - [address] [place] [address] Phone: [phone] Fax: [phone]     Social Drivers of Health (SDOH) Social History: SDOH Screenings   Food Insecurity: No Food Insecurity (04/12/2024)  Housing: Low Risk  (04/12/2024)  Transportation Needs: No Transportation Needs (04/12/2024)  Utilities: Not At Risk (04/12/2024)  Alcohol  Screen: Low Risk  (09/06/2022)  Depression (PHQ2-9): Low Risk  (03/03/2024)  Financial Resource Strain: Low Risk  (09/06/2022)  Physical Activity: Sufficiently Active (09/06/2022)  Social Connections: Moderately Isolated (04/12/2024)  Stress: No Stress Concern Present (09/06/2022)  Tobacco Use: Low Risk  (04/12/2024)   SDOH Interventions: Food Insecurity Interventions: Intervention Not Indicated Housing Interventions: Intervention Not Indicated Transportation Interventions: Intervention Not Indicated Utilities Interventions: Intervention Not Indicated Social Connections Interventions: Intervention Not Indicated   Readmission Risk Interventions    12/28/2023     5:41 PM 09/15/2023    3:04 PM 04/08/2023    2:11 PM  Readmission Risk Prevention Plan  Transportation Screening Complete Complete Complete  PCP or Specialist Appt within 3-5 Days   Not Complete  HRI or Home Care Consult  Complete Complete  Social Work Consult for Recovery Care Planning/Counseling  Complete Complete  Palliative Care Screening  Not Applicable Not Applicable  Medication Review Oceanographer) Complete Complete Complete  PCP or Specialist appointment within 3-5 days of discharge Complete    HRI or Home Care Consult Complete    SW Recovery Care/Counseling Consult Complete    Palliative Care Screening Not Applicable    Skilled Nursing Facility Not Applicable

## 2024-04-21 NOTE — Progress Notes (Incomplete)
 Mobility Specialist Progress Note:    04/21/24 1100  Mobility  Activity Ambulated with assistance  Level of Assistance Standby assist, set-up cues, supervision of patient - no hands on  Assistive Device Front wheel walker  Distance Ambulated (ft) 20 ft  Activity Response Tolerated well  Mobility Referral Yes  Mobility visit 1 Mobility  Mobility Specialist Start Time (ACUTE ONLY) 1100  Mobility Specialist Stop Time (ACUTE ONLY) 1120  Mobility Specialist Time Calculation (min) (ACUTE ONLY) 20 min   Pt received in bathroom. Requesting assistance back to   [name] Mobility Specialist Please contact via SecureChat or  Rehab office at [phone]

## 2024-04-21 NOTE — Progress Notes (Signed)
 PROGRESS NOTE    [name]  FMW:[id number] DOB: 1931-10-23 DOA: 04/12/2024 PCP: [name], MD   Brief Narrative:    89 y.o. 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  At that time she was seen by IR and vascular surgery and they had deemed that stable celiac artery stenosis is not the cause of her symptoms.  Plan was to follow-up with GI as outpatient. Today patient went for follow-up to IR clinic, she continued to complain of abdominal pain which she describes as burning.  Patient says that she has not not been able to eat for past 1 week.  The only thing she eats is broth, chicken soup or yogurt.  Denies vomiting, has been complaining of nausea.  No diarrhea she did have a small BM today. IR sent her to the ED for admission and possible stent placement in the celiac artery for celiac artery stenosis. Hospital stay complicated by pseudoaneurysms at access site. She is pending placement to skilled nursing facility now  Assessment & Plan:  Principal Problem:   Mesenteric ischemia   Mesenteric ischemia -Post superior mesenteric artery stent done on 04/12/2024 via right CFA access and celiac artery stent done on 04/15/2024 via left CFA access.  Both procedures were complicated by right and left femoral artery pseudoaneurysms. - Right pseudoaneurysm was injected with thrombin on 04/15/2024 during celiac artery stent placement procedure. 10/19: IR: the right is still open but even smaller.  Would not do anything more- had that for a year and its not symptomatic.  It has been injected  2x just b/c its there and it wont take, probably because its so chronic and well formed. The left is fixed. She  should be fine for PT.  -Pain control -Outpatient follow-up with Dr. Jennefer in 1 month.   Diabetes mellitus type 2 - Metformin  on hold, continue sliding scale insulin  NovoLog  - CBG well-controlled   Hypertension - Continue with amlodipine  10 mg p.o. daily   Generalized anxiety disorder - Continue Xanax  0.25 mg at bedtime   Disposition: Pending placement to rehab     Code Status: Limited: Do not attempt resuscitation (DNR) -DNR-LIMITED -Do Not Intubate/DNI  Family Communication: None at the bedside Status is: Inpatient Remains inpatient appropriate because: Pending placement to rehab    Subjective:  She said that her neck is hurting since the procedure was done.  She said that she wants to go to a skilled nursing facility to get extensive physical therapy.  She also told me that her grandson is a physical therapist and her granddaughter is a Teacher, early years/pre.  Feeling weak and tired.  She said the lidocaine  patch has helped some but not a whole lot.  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  Examination:  General exam: Appears calm and comfortable  Respiratory system: Clear to auscultation. Respiratory effort normal. Cardiovascular system: S1 & S2 heard, RRR. No JVD, murmurs, rubs, gallops or clicks. No pedal edema. Gastrointestinal system: Abdomen is nondistended, soft and nontender. No organomegaly or masses felt. Normal bowel sounds heard. Central nervous system: Alert and oriented. No focal neurological deficits. Extremities: Symmetric 5 x 5 power. Skin: No rashes, lesions or ulcers Psychiatry: Judgement and insight appear normal.  Mood & affect appropriate.      Diet Orders (From admission, onward)     Start     Ordered   04/18/24 1327  DIET SOFT Room service appropriate? Yes; Fluid consistency: Thin  Diet effective now       Question Answer Comment  Room service appropriate? Yes   Fluid consistency: Thin      04/18/24 1326             Objective: Vitals:   04/21/24 0023 04/21/24 0400 04/21/24 0600 04/21/24 0917  BP:  (!) 135/93  (!) 141/52  Pulse: 81 71 79 64  Resp: (!) 24 (!) 21 (!) 24 18  Temp:  98.9 F (37.2 C)  98.7 F (37.1 C)  TempSrc:  Axillary  Oral  SpO2: 91% 90% 91% 91%  Weight:      Height:        Intake/Output Summary (Last 24 hours) at 04/21/2024 1025 Last data filed at 04/20/2024 1331 Gross per 24 hour  Intake 240 ml  Output --  Net 240 ml   Filed Weights   04/12/24 1118  Weight: 75.3 kg    Scheduled Meds:  ALPRAZolam   0.25 mg Oral QHS   amLODipine   10 mg Oral Daily   aspirin   81 mg Oral Daily   cycloSPORINE   1 drop Both Eyes BID   feeding supplement  237 mL Oral BID BM   gabapentin   100 mg Oral Daily   insulin  aspart  0-9 Units Subcutaneous TID WC   ketorolac  15 mg Intravenous Once   lactobacillus  1 g Oral TID WC   lidocaine   1 patch Transdermal Q24H   pantoprazole   40 mg Oral BID AC   polyethylene glycol  17 g Oral Daily   SMOG  960 mL Rectal Once   tamsulosin   0.4 mg Oral Daily   thrombin 5,000 units for percutaneous treatment of pseudoaneurysm   Percutaneous Once   Continuous Infusions:  Nutritional status     Body mass index is 25.24 kg/m.  Data Reviewed:   CBC: Recent Labs  Lab 04/17/24 0331 04/18/24 0400 04/19/24 0305 04/20/24 0323 04/21/24 0322  WBC 8.2 7.2 7.9 7.8 8.2  HGB 10.1* 10.0* 9.3* 9.1* 9.0*  HCT 31.5* 31.2* 29.4* 28.4* 28.6*  MCV 86.5 87.9 88.3 87.1 87.7  PLT 232 227 221 219 240   Basic Metabolic Panel: Recent Labs  Lab 04/17/24 0331 04/18/24 0400 04/19/24 0305 04/20/24 0323 04/21/24 0322  NA 138 138 138 136 136  K 4.2 3.9 4.8 4.1 4.4  CL 103 102 101 100 101  CO2 26 26 26 28 27   GLUCOSE 142* 179* 166* 158* 160*  BUN 17 19 29* 23 25*  CREATININE 0.94 0.88 1.04* 0.96 0.92  CALCIUM 8.7* 9.2 9.2 8.9 9.0   GFR: Estimated Creatinine Clearance: 41 mL/min (by C-G formula based on SCr of 0.92 mg/dL). Liver Function Tests: No  results for input(s): AST, ALT, ALKPHOS, BILITOT, PROT, ALBUMIN in the last 168 hours. Recent Labs  Lab 04/16/24 1907  LIPASE 22   No results for input(s): AMMONIA in the last 168 hours. Coagulation Profile: No results for input(s): INR, PROTIME in the last 168 hours. Cardiac Enzymes: No results for input(s): CKTOTAL, CKMB, CKMBINDEX, TROPONINI in the last 168 hours. BNP (last 3 results) No results for input(s): PROBNP in the last 8760 hours. HbA1C: No results for input(s): HGBA1C in the last 72 hours. CBG: Recent Labs  Lab 04/20/24 1216 04/20/24 1258 04/20/24  1632 04/20/24 2127 04/21/24 0636  GLUCAP 181* 186* 177* 203* 159*   Lipid Profile: No results for input(s): CHOL, HDL, LDLCALC, TRIG, CHOLHDL, LDLDIRECT in the last 72 hours. Thyroid  Function Tests: No results for input(s): TSH, T4TOTAL, FREET4, T3FREE, THYROIDAB in the last 72 hours. Anemia Panel: No results for input(s): VITAMINB12, FOLATE, FERRITIN, TIBC, IRON , RETICCTPCT in the last 72 hours. Sepsis Labs: Recent Labs  Lab 04/15/24 0320 04/16/24 1907  LATICACIDVEN 1.2 1.7    No results found for this or any previous visit (from the past 240 hours).       Radiology Studies: No results found.         LOS: 8 days   Time spent= 37 mins    Deliliah Room, MD [facility]  If 7PM-7AM, please contact night-coverage  04/21/2024, 10:25 AM

## 2024-04-21 NOTE — TOC Progression Note (Signed)
 Transition of Care [facility]) - Progression Note    Patient Details  Name: [name] MRN: [id number] Date of Birth: 1931-10-21  Transition of [facility]) CM/SW Contact  Montie LOISE Louder, [place] Phone Number: 04/21/2024, 3:02 PM  Clinical Narrative:     [name]/Kerri confirmed they can admit patient tomorrow if remains stable.  MD updated  Montie Louder, MSW, LCSW Clinical Social Worker    Expected Discharge Plan: Skilled Nursing Facility Barriers to Discharge: Continued Medical Work up               Expected Discharge Plan and Services In-house Referral: Clinical Social Work     Living arrangements for the past 2 months: Skilled Nursing Facility                                       Social Drivers of Health (SDOH) Interventions SDOH Screenings   Food Insecurity: No Food Insecurity (04/12/2024)  Housing: Low Risk  (04/12/2024)  Transportation Needs: No Transportation Needs (04/12/2024)  Utilities: Not At Risk (04/12/2024)  Alcohol  Screen: Low Risk  (09/06/2022)  Depression (PHQ2-9): Low Risk  (03/03/2024)  Financial Resource Strain: Low Risk  (09/06/2022)  Physical Activity: Sufficiently Active (09/06/2022)  Social Connections: Moderately Isolated (04/12/2024)  Stress: No Stress Concern Present (09/06/2022)  Tobacco Use: Low Risk  (04/12/2024)    Readmission Risk Interventions    12/28/2023    5:41 PM 09/15/2023    3:04 PM 04/08/2023    2:11 PM  Readmission Risk Prevention Plan  Transportation Screening Complete Complete Complete  PCP or Specialist Appt within 3-5 Days   Not Complete  HRI or Home Care Consult  Complete Complete  Social Work Consult for Recovery Care Planning/Counseling  Complete Complete  Palliative Care Screening  Not Applicable Not Applicable  Medication Review Ocean[facility]) Complete Complete Complete  PCP or Specialist appointment within 3-5 days of discharge Complete    HRI or Home Care Consult Complete    SW Recovery  Care/Counseling Consult Complete    Palliative Care Screening Not Applicable    Skilled Nursing Facility Not Applicable

## 2024-04-22 DIAGNOSIS — R1030 Lower abdominal pain, unspecified: Secondary | ICD-10-CM | POA: Diagnosis not present

## 2024-04-22 DIAGNOSIS — I724 Aneurysm of artery of lower extremity: Secondary | ICD-10-CM | POA: Diagnosis not present

## 2024-04-22 DIAGNOSIS — E1142 Type 2 diabetes mellitus with diabetic polyneuropathy: Secondary | ICD-10-CM | POA: Diagnosis not present

## 2024-04-22 DIAGNOSIS — I152 Hypertension secondary to endocrine disorders: Secondary | ICD-10-CM | POA: Diagnosis not present

## 2024-04-22 DIAGNOSIS — Z95828 Presence of other vascular implants and grafts: Secondary | ICD-10-CM | POA: Diagnosis not present

## 2024-04-22 DIAGNOSIS — I700 Atherosclerosis of aorta: Secondary | ICD-10-CM | POA: Diagnosis not present

## 2024-04-22 DIAGNOSIS — E1140 Type 2 diabetes mellitus with diabetic neuropathy, unspecified: Secondary | ICD-10-CM | POA: Diagnosis not present

## 2024-04-22 DIAGNOSIS — Z743 Need for continuous supervision: Secondary | ICD-10-CM | POA: Diagnosis not present

## 2024-04-22 DIAGNOSIS — I701 Atherosclerosis of renal artery: Secondary | ICD-10-CM | POA: Diagnosis not present

## 2024-04-22 DIAGNOSIS — R197 Diarrhea, unspecified: Secondary | ICD-10-CM | POA: Diagnosis not present

## 2024-04-22 DIAGNOSIS — K219 Gastro-esophageal reflux disease without esophagitis: Secondary | ICD-10-CM | POA: Diagnosis not present

## 2024-04-22 DIAGNOSIS — R238 Other skin changes: Secondary | ICD-10-CM | POA: Diagnosis not present

## 2024-04-22 DIAGNOSIS — R1310 Dysphagia, unspecified: Secondary | ICD-10-CM | POA: Diagnosis not present

## 2024-04-22 DIAGNOSIS — I10 Essential (primary) hypertension: Secondary | ICD-10-CM | POA: Diagnosis not present

## 2024-04-22 DIAGNOSIS — R109 Unspecified abdominal pain: Secondary | ICD-10-CM | POA: Diagnosis not present

## 2024-04-22 DIAGNOSIS — K55059 Acute (reversible) ischemia of intestine, part and extent unspecified: Secondary | ICD-10-CM | POA: Diagnosis not present

## 2024-04-22 DIAGNOSIS — R630 Anorexia: Secondary | ICD-10-CM | POA: Diagnosis not present

## 2024-04-22 DIAGNOSIS — K551 Chronic vascular disorders of intestine: Secondary | ICD-10-CM | POA: Diagnosis not present

## 2024-04-22 DIAGNOSIS — F0390 Unspecified dementia without behavioral disturbance: Secondary | ICD-10-CM | POA: Diagnosis not present

## 2024-04-22 DIAGNOSIS — R41841 Cognitive communication deficit: Secondary | ICD-10-CM | POA: Diagnosis not present

## 2024-04-22 DIAGNOSIS — N390 Urinary tract infection, site not specified: Secondary | ICD-10-CM | POA: Diagnosis not present

## 2024-04-22 DIAGNOSIS — F411 Generalized anxiety disorder: Secondary | ICD-10-CM | POA: Diagnosis not present

## 2024-04-22 DIAGNOSIS — Z7982 Long term (current) use of aspirin: Secondary | ICD-10-CM | POA: Diagnosis not present

## 2024-04-22 DIAGNOSIS — Z7984 Long term (current) use of oral hypoglycemic drugs: Secondary | ICD-10-CM | POA: Diagnosis not present

## 2024-04-22 DIAGNOSIS — E1159 Type 2 diabetes mellitus with other circulatory complications: Secondary | ICD-10-CM | POA: Diagnosis not present

## 2024-04-22 DIAGNOSIS — D62 Acute posthemorrhagic anemia: Secondary | ICD-10-CM | POA: Diagnosis not present

## 2024-04-22 DIAGNOSIS — R262 Difficulty in walking, not elsewhere classified: Secondary | ICD-10-CM | POA: Diagnosis not present

## 2024-04-22 DIAGNOSIS — M6281 Muscle weakness (generalized): Secondary | ICD-10-CM | POA: Diagnosis not present

## 2024-04-22 DIAGNOSIS — E1165 Type 2 diabetes mellitus with hyperglycemia: Secondary | ICD-10-CM | POA: Diagnosis not present

## 2024-04-22 DIAGNOSIS — K559 Vascular disorder of intestine, unspecified: Secondary | ICD-10-CM | POA: Diagnosis not present

## 2024-04-22 DIAGNOSIS — R1084 Generalized abdominal pain: Secondary | ICD-10-CM | POA: Diagnosis present

## 2024-04-22 DIAGNOSIS — K5792 Diverticulitis of intestine, part unspecified, without perforation or abscess without bleeding: Secondary | ICD-10-CM | POA: Diagnosis not present

## 2024-04-22 DIAGNOSIS — Z79899 Other long term (current) drug therapy: Secondary | ICD-10-CM | POA: Diagnosis not present

## 2024-04-22 LAB — GLUCOSE, CAPILLARY
Glucose-Capillary: 177 mg/dL — ABNORMAL HIGH (ref 70–99)
Glucose-Capillary: 209 mg/dL — ABNORMAL HIGH (ref 70–99)

## 2024-04-22 MED ORDER — ONDANSETRON HCL 4 MG PO TABS: 4.0000 mg | ORAL_TABLET | Freq: Four times a day (QID) | ORAL | Status: DC | PRN

## 2024-04-22 MED ORDER — GABAPENTIN 100 MG PO CAPS: 100.0000 mg | ORAL_CAPSULE | Freq: Every day | ORAL | 0 refills | Status: DC

## 2024-04-22 MED ORDER — LIDOCAINE 5 % EX PTCH: 1.0000 | MEDICATED_PATCH | CUTANEOUS | Status: DC

## 2024-04-22 MED ORDER — FLORANEX PO PACK: 1.0000 g | PACK | Freq: Three times a day (TID) | ORAL | Status: DC

## 2024-04-22 MED ORDER — OXYCODONE HCL 5 MG PO TABS: 5.0000 mg | ORAL_TABLET | Freq: Three times a day (TID) | ORAL | 0 refills | Status: DC | PRN

## 2024-04-22 MED ORDER — POLYETHYLENE GLYCOL 3350 17 G PO PACK: 17.0000 g | PACK | Freq: Every day | ORAL | Status: AC | PRN

## 2024-04-22 MED ORDER — ALBUTEROL SULFATE (2.5 MG/3ML) 0.083% IN NEBU: 2.5000 mg | INHALATION_SOLUTION | Freq: Four times a day (QID) | RESPIRATORY_TRACT | Status: DC | PRN

## 2024-04-22 MED ORDER — ALPRAZOLAM 0.25 MG PO TABS: 0.2500 mg | ORAL_TABLET | Freq: Every evening | ORAL | 0 refills | Status: DC | PRN

## 2024-04-22 MED ORDER — VITAMINS A & D EX OINT: TOPICAL_OINTMENT | CUTANEOUS | Status: DC | PRN

## 2024-04-22 MED ORDER — OXYCODONE HCL 5 MG PO TABS
5.0000 mg | ORAL_TABLET | Freq: Once | ORAL | Status: AC
  Administered 2024-04-22: 5 mg via ORAL
  Filled 2024-04-22: qty 1

## 2024-04-22 MED ORDER — ENSURE PLUS HIGH PROTEIN PO LIQD: 237.0000 mL | Freq: Two times a day (BID) | ORAL | Status: DC

## 2024-04-22 MED ORDER — CYCLOSPORINE 0.05 % OP EMUL: 1.0000 [drp] | Freq: Two times a day (BID) | OPHTHALMIC | Status: DC

## 2024-04-22 NOTE — Discharge Summary (Signed)
 Physician Discharge Summary   Patient: [name] MRN: [id number] DOB: 06/05/1932  Admit date:     04/12/2024  Discharge date: 04/22/24  Discharge Physician: Deliliah Room   PCP: [name], MD   Recommendations at discharge:    F/u with your PCP in one week F/u with Dr Jennefer in one month Continue taking meds as prescribed  Discharge Diagnoses: Principal Problem:   Mesenteric ischemia   Hospital Course:  Acute Mesenteric ischemia -Post superior mesenteric artery stent done on 04/12/2024 via right CFA access and celiac artery stent done on 04/15/2024 via left CFA access.  Both procedures were complicated by right and left femoral artery pseudoaneurysms. - Right pseudoaneurysm was injected with thrombin on 04/15/2024 during celiac artery stent placement procedure. 10/19: IR: the right is still open but even smaller.  Would not do anything more- had that for a year and its not symptomatic.  It has been injected  2x just b/c its there and it wont take, probably because its so chronic and well formed. The left is fixed.  -Pain control with meds -Outpatient follow-up with Dr. Jennefer in 1 month.   Diabetes mellitus type 2 - Continue with metformin   Hypertension - Continue with amlodipine  10 mg p.o. daily and hydralazine  25 mg po TID.   Generalized anxiety disorder - Continue Xanax  0.25 mg at bedtime prn   Disposition: [facility]      Consultants: IR Procedures performed: superior mesenteric artery stent done on 04/12/2024 via right CFA access and celiac artery stent done on 04/15/2024 via left CFA access.  Disposition: Skilled nursing facility Diet recommendation:  Cardiac diet DISCHARGE MEDICATION: Allergies as of 04/22/2024       Reactions   Statins Other (See Comments)   Severe myalgias   Phenergan [promethazine Hcl] Other (See Comments)   Hallucinations    Hydrocortisone Itching   Lisinopril Other (See Comments)   Lethargy, Fatigue   Prednisone  Rash    Trazodone  And Nefazodone Cough        Medication List     STOP taking these medications    albuterol  108 (90 Base) MCG/ACT inhaler Commonly known as: VENTOLIN  HFA Replaced by: albuterol  (2.5 MG/3ML) 0.083% nebulizer solution   bisacodyl  5 MG EC tablet Generic drug: bisacodyl    Metamucil 4 in 1 Fiber 25 % Pack Generic drug: Psyllium   ondansetron  4 MG disintegrating tablet Commonly known as: ZOFRAN -ODT   oxyCODONE -acetaminophen  5-325 MG tablet Commonly known as: PERCOCET/ROXICET   polyethylene glycol powder 17 GM/SCOOP powder Commonly known as: GoodSense ClearLax Replaced by: polyethylene glycol 17 g packet   senna-docusate 8.6-50 MG tablet Commonly known as: Senokot-S   sodium chloride  1 g tablet   valsartan  320 MG tablet Commonly known as: DIOVAN        TAKE these medications    acetaminophen  325 MG tablet Commonly known as: TYLENOL  TAKE (2) TABLETS BY MOUTH EVERY SIX HOURS AS NEEDED FOR PAIN. MAX 3GM IN 24 HOURS.   albuterol  (2.5 MG/3ML) 0.083% nebulizer solution Commonly known as: PROVENTIL  Inhale 3 mLs (2.5 mg total) into the lungs every 6 (six) hours as needed for wheezing or shortness of breath. Replaces: albuterol  108 (90 Base) MCG/ACT inhaler   ALPRAZolam  0.25 MG tablet Commonly known as: XANAX  Take 1 tablet (0.25 mg total) by mouth at bedtime as needed for anxiety. What changed:  when to take this reasons to take this   amLODipine  10 MG tablet Commonly known as: NORVASC  TAKE (1) TABLET BY MOUTH ONCE DAILY.  Aspirin  Low Dose 81 MG chewable tablet Generic drug: aspirin  CHEW (1) TABLET BY MOUTH ONCE DAILY.   Beta Carotene Provitamin A 25000 units capsule Generic drug: beta carotene TAKE (1) CAPSULE BY MOUTH AT BEDTIME.   cyanocobalamin  1000 MCG tablet Take 1,000 mcg by mouth at bedtime.   cycloSPORINE  0.05 % ophthalmic emulsion Commonly known as: RESTASIS  Place 1 drop into both eyes 2 (two) times daily.   EasyMax Test test  strip Generic drug: glucose blood CHECK BLOOD SUGAR ONCE DAILY.(CALL MD IF BS BELOW 60: OR IF BS ABOVE 400)   estradiol  0.1 MG/GM vaginal cream Commonly known as: ESTRACE  Discard plastic applicator. Insert a blueberry size amount (approximately 1 gram) of cream on fingertip inside vagina at bedtime every night for 1 week then every other night. For long term use.   feeding supplement Liqd Take 237 mLs by mouth 2 (two) times daily between meals.   gabapentin  100 MG capsule Commonly known as: NEURONTIN  Take 1 capsule (100 mg total) by mouth daily. Start taking on: April 23, 2024 What changed:  how much to take how to take this when to take this additional instructions   hydrALAZINE  25 MG tablet Commonly known as: APRESOLINE  Take 1 tablet (25 mg total) by mouth 3 (three) times daily.   iVIZIA Dry Eyes 0.5 % Soln Generic drug: Povidone (PF) Place 1 drop into both eyes 2 (two) times daily.   ketoconazole  2 % cream Commonly known as: NIZORAL  Apply 1 Application topically 2 (two) times daily. For 10 days to groin rash   lactobacillus Pack Take 1 packet (1 g total) by mouth 3 (three) times daily with meals.   lidocaine  5 % Commonly known as: LIDODERM  Place 1 patch onto the skin daily. Remove & Discard patch within 12 hours or as directed by MD   Melatonin 10 MG Tabs Take 10 mg by mouth at bedtime.   metFORMIN  500 MG tablet Commonly known as: GLUCOPHAGE  TAKE (1) TABLET BY MOUTH IN THE MORNING & (1/2) TABLET (250MG ) BY MOUTH AT SUPPER   methenamine  1 g tablet Commonly known as: HIPREX  Take 1 tablet (1 g total) by mouth 2 (two) times daily with a meal. Most effective when taken with a daily Vitamin C supplement.   mupirocin  ointment 2 % Commonly known as: BACTROBAN  Apply thin amount twice daily to rash in groin creases twice daily for 5 days at a time when rash occurs   ondansetron  4 MG tablet Commonly known as: ZOFRAN  Take 1 tablet (4 mg total) by mouth every 6 (six)  hours as needed for nausea or vomiting.   oxyCODONE  5 MG immediate release tablet Commonly known as: Roxicodone  Take 1 tablet (5 mg total) by mouth every 8 (eight) hours as needed for severe pain (pain score 7-10).   pantoprazole  40 MG tablet Commonly known as: PROTONIX  Take 1 tablet (40 mg total) by mouth 2 (two) times daily before a meal.   polyethylene glycol 17 g packet Commonly known as: MIRALAX  / GLYCOLAX  Take 17 g by mouth daily as needed for moderate constipation. Replaces: polyethylene glycol powder 17 GM/SCOOP powder   PreserVision AREDS 2 Caps Take 1 capsule by mouth in the morning and at bedtime.   Safety Lancets 28G Misc CHECK BLOOD SUGAR ONCE DAILY.   tamsulosin  0.4 MG Caps capsule Commonly known as: FLOMAX  Take 1 capsule (0.4 mg total) by mouth daily.   vitamin A & D ointment Apply topically as needed for dry skin (Leg).   Vitamin  D (Cholecalciferol ) 10 MCG (400 UNIT) Tabs TAKE (1) TABLET BY MOUTH ONCE DAILY.        Follow-up Information     Luking, Glendia LABOR, MD. Schedule an appointment as soon as possible for a visit in 1 week(s).   Specialty: Family Medicine Contact information: [address] [place] [address] [phone]         Jennefer Ester PARAS, MD. Schedule an appointment as soon as possible for a visit in 1 month(s).   Specialties: Interventional Radiology, Diagnostic Radiology, Radiology Contact information: [address] [place] [address] [phone]                Discharge Exam: Filed Weights   04/12/24 1118  Weight: 75.3 kg   Constitutional: NAD, calm, comfortable Eyes: PERRL, lids and conjunctivae normal ENMT: Mucous membranes are moist. Posterior pharynx clear of any exudate or lesions.Normal dentition.  Neck: normal, supple, no masses, no thyromegaly Respiratory: clear to auscultation bilaterally, no wheezing, no crackles. Normal respiratory effort. No accessory muscle use.   Cardiovascular: Regular rate and rhythm, no murmurs / rubs / gallops. No extremity edema. 2+ pedal pulses. No carotid bruits.  Abdomen: no tenderness, no masses palpated. No hepatosplenomegaly. Bowel sounds positive.  Musculoskeletal: no clubbing / cyanosis. No joint deformity upper and lower extremities. Good ROM, no contractures. Normal muscle tone.  Skin: no rashes, lesions, ulcers. No induration Neurologic: CN 2-12 grossly intact. Sensation intact, DTR normal. Strength 5/5 x all 4 extremities.  Psychiatric: Normal judgment and insight. Alert and oriented x 3. Normal mood.    Condition at discharge: good  The results of significant diagnostics from this hospitalization (including imaging, microbiology, ancillary and laboratory) are listed below for reference.   Imaging Studies:   Microbiology: Results for orders placed or performed during the hospital encounter of 03/26/24  Urine Culture     Status: Abnormal   Collection Time: 03/26/24  3:56 PM   Specimen: Urine, Clean Catch  Result Value Ref Range Status   Specimen Description   Final    URINE, CLEAN CATCH Performed at [facility], [address], [place], [place] [address]    Special Requests   Final    NONE Performed at [facility], [address] [place], [place] [address]    Culture >=100,000 COLONIES/mL ESCHERICHIA COLI (A)  Final   Report Status 03/29/2024 FINAL  Final   Organism ID, Bacteria ESCHERICHIA COLI (A)  Final      Susceptibility   Escherichia coli - MIC*    AMPICILLIN <=2 SENSITIVE Sensitive     CEFAZOLIN  (URINE) Value in next row Sensitive      <=1 SENSITIVEThis is a modified FDA-approved test that has been validated and its performance characteristics determined by the reporting laboratory.  This laboratory is certified under the Clinical Laboratory Improvement Amendments CLIA as qualified to perform high complexity clinical laboratory testing.    CEFEPIME  Value in next row Sensitive      <=1  SENSITIVEThis is a modified FDA-approved test that has been validated and its performance characteristics determined by the reporting laboratory.  This laboratory is certified under the Clinical Laboratory Improvement Amendments CLIA as qualified to perform high complexity clinical laboratory testing.    ERTAPENEM Value in next row Sensitive      <=1 SENSITIVEThis is a modified FDA-approved test that has been validated and its performance characteristics determined by the reporting laboratory.  This laboratory is certified under the Clinical Laboratory Improvement Amendments  CLIA as qualified to perform high complexity clinical laboratory testing.    CEFTRIAXONE  Value in next row Sensitive      <=1 SENSITIVEThis is a modified FDA-approved test that has been validated and its performance characteristics determined by the reporting laboratory.  This laboratory is certified under the Clinical Laboratory Improvement Amendments CLIA as qualified to perform high complexity clinical laboratory testing.    CIPROFLOXACIN  Value in next row Sensitive      <=1 SENSITIVEThis is a modified FDA-approved test that has been validated and its performance characteristics determined by the reporting laboratory.  This laboratory is certified under the Clinical Laboratory Improvement Amendments CLIA as qualified to perform high complexity clinical laboratory testing.    GENTAMICIN Value in next row Sensitive      <=1 SENSITIVEThis is a modified FDA-approved test that has been validated and its performance characteristics determined by the reporting laboratory.  This laboratory is certified under the Clinical Laboratory Improvement Amendments CLIA as qualified to perform high complexity clinical laboratory testing.    NITROFURANTOIN  Value in next row Sensitive      <=1 SENSITIVEThis is a modified FDA-approved test that has been validated and its performance characteristics determined by the reporting laboratory.  This laboratory  is certified under the Clinical Laboratory Improvement Amendments CLIA as qualified to perform high complexity clinical laboratory testing.    TRIMETH /SULFA  Value in next row Sensitive      <=1 SENSITIVEThis is a modified FDA-approved test that has been validated and its performance characteristics determined by the reporting laboratory.  This laboratory is certified under the Clinical Laboratory Improvement Amendments CLIA as qualified to perform high complexity clinical laboratory testing.    AMPICILLIN/SULBACTAM Value in next row Sensitive      <=1 SENSITIVEThis is a modified FDA-approved test that has been validated and its performance characteristics determined by the reporting laboratory.  This laboratory is certified under the Clinical Laboratory Improvement Amendments CLIA as qualified to perform high complexity clinical laboratory testing.    PIP/TAZO Value in next row Sensitive      <=4 SENSITIVEThis is a modified FDA-approved test that has been validated and its performance characteristics determined by the reporting laboratory.  This laboratory is certified under the Clinical Laboratory Improvement Amendments CLIA as qualified to perform high complexity clinical laboratory testing.    MEROPENEM Value in next row Sensitive      <=4 SENSITIVEThis is a modified FDA-approved test that has been validated and its performance characteristics determined by the reporting laboratory.  This laboratory is certified under the Clinical Laboratory Improvement Amendments CLIA as qualified to perform high complexity clinical laboratory testing.    * >=100,000 COLONIES/mL ESCHERICHIA COLI    Labs: CBC: Recent Labs  Lab 04/17/24 0331 04/18/24 0400 04/19/24 0305 04/20/24 0323 04/21/24 0322  WBC 8.2 7.2 7.9 7.8 8.2  HGB 10.1* 10.0* 9.3* 9.1* 9.0*  HCT 31.5* 31.2* 29.4* 28.4* 28.6*  MCV 86.5 87.9 88.3 87.1 87.7  PLT 232 227 221 219 240   Basic Metabolic Panel: Recent Labs  Lab 04/17/24 0331  04/18/24 0400 04/19/24 0305 04/20/24 0323 04/21/24 0322  NA 138 138 138 136 136  K 4.2 3.9 4.8 4.1 4.4  CL 103 102 101 100 101  CO2 26 26 26 28 27   GLUCOSE 142* 179* 166* 158* 160*  BUN 17 19 29* 23 25*  CREATININE 0.94 0.88 1.04* 0.96 0.92  CALCIUM 8.7* 9.2 9.2 8.9 9.0   Liver Function Tests: No results for input(s): AST, ALT,  ALKPHOS, BILITOT, PROT, ALBUMIN in the last 168 hours. CBG: Recent Labs  Lab 04/21/24 0636 04/21/24 1316 04/21/24 1705 04/21/24 2110 04/22/24 0611  GLUCAP 159* 135* 168* 162* 177*    Discharge time spent: 40 minutes.  Signed: Deliliah Room, MD [facility] 04/22/2024

## 2024-04-22 NOTE — Care Management Important Message (Signed)
 Important Message  Patient Details  Name: DELOIS SILVESTER MRN: [id number] Date of Birth: 04/09/1932   Important Message Given:  Yes - Medicare IM     Vonzell Arrie Sharps 04/22/2024, 11:55 AM

## 2024-04-22 NOTE — TOC Transition Note (Signed)
 Transition of Care [facility]) - Discharge Note   Patient Details  Name: [name] MRN: [id number] Date of Birth: [birth year]  Transition of Care [facility]) CM/SW Contact:  Montie LOISE Louder, LCSW Phone Number: 04/22/2024, 11:58 AM   Clinical Narrative:     Patient will Discharge to: [name]  Discharge Date: 04/22/24 Family Notified:  son Transport By: [employer] @ [phone], will need door to door to the facility. [employer] # [phone]- Zoe Dee has been signed and placed in chart.   Per MD patient is ready for discharge. RN, patient, and facility notified of discharge. Discharge Summary sent to facility. RN given number for [phone]. Ambulance transport requested for patient.   Clinical Social Worker signing off.  Montie Louder, MSW, LCSW Clinical Social Worker     Final next level of care: Skilled Nursing Facility Barriers to Discharge: Barriers Resolved   Patient Goals and CMS Choice Patient states their goals for this hospitalization and ongoing recovery are:: Return to LTC          Discharge Placement              Patient chooses bed at: [facility] Patient to be transferred to facility by: [facility] Name of family member notified: son Patient and family notified of of transfer: 04/22/24  Discharge Plan and Services Additional resources added to the After Visit Summary for   In-house Referral: Clinical Social Work                                   Social Drivers of Health (SDOH) Interventions SDOH Screenings   Food Insecurity: No Food Insecurity (04/12/2024)  Housing: Low Risk  (04/12/2024)  Transportation Needs: No Transportation Needs (04/12/2024)  Utilities: Not At Risk (04/12/2024)  Alcohol  Screen: Low Risk  (09/06/2022)  Depression (PHQ2-9): Low Risk  (03/03/2024)  Financial Resource Strain: Low Risk  (09/06/2022)  Physical Activity: Sufficiently Active (09/06/2022)  Social Connections: Moderately  Isolated (04/12/2024)  Stress: No Stress Concern Present (09/06/2022)  Tobacco Use: Low Risk  (04/12/2024)     Readmission Risk Interventions    12/28/2023    5:41 PM 09/15/2023    3:04 PM 04/08/2023    2:11 PM  Readmission Risk Prevention Plan  Transportation Screening Complete Complete Complete  PCP or Specialist Appt within 3-5 Days   Not Complete  HRI or Home Care Consult  Complete Complete  Social Work Consult for Recovery Care Planning/Counseling  Complete Complete  Palliative Care Screening  Not Applicable Not Applicable  Medication Review Oceanographer) Complete Complete Complete  PCP or Specialist appointment within 3-5 days of discharge Complete    HRI or Home Care Consult Complete    SW Recovery Care/Counseling Consult Complete    Palliative Care Screening Not Applicable    Skilled Nursing Facility Not Applicable

## 2024-04-22 NOTE — TOC Progression Note (Signed)
 Transition of Care [facility]) - Progression Note    Patient Details  Name: [name] MRN: [id number] Date of Birth: 12/04/1931  Transition of Care [facility]) CM/SW Contact  Montie LOISE Louder, [place] Phone Number: 04/22/2024, 11:52 AM  Clinical Narrative:     CSW informed patient's son of d/c today. He states he was unable to provide transportation to SNF.  CSW met with patient -she states she would not be able to pay for non-emergency ambulance, including has walked over 400 ft.  Patient signed Therapist, nutritional for transportation to [employer].   CSW updated RN- will need to call [facility] @ [phone], will need door to door to the facility.   Montie Louder, MSW, LCSW Clinical Social Worker    Expected Discharge Plan: Skilled Nursing Facility Barriers to Discharge: Barriers Resolved               Expected Discharge Plan and Services In-house Referral: Clinical Social Work     Living arrangements for the past 2 months: Skilled Nursing Facility Expected Discharge Date: 04/22/24                                     Social Drivers of Health (SDOH) Interventions SDOH Screenings   Food Insecurity: No Food Insecurity (04/12/2024)  Housing: Low Risk  (04/12/2024)  Transportation Needs: No Transportation Needs (04/12/2024)  Utilities: Not At Risk (04/12/2024)  Alcohol  Screen: Low Risk  (09/06/2022)  Depression (PHQ2-9): Low Risk  (03/03/2024)  Financial Resource Strain: Low Risk  (09/06/2022)  Physical Activity: Sufficiently Active (09/06/2022)  Social Connections: Moderately Isolated (04/12/2024)  Stress: No Stress Concern Present (09/06/2022)  Tobacco Use: Low Risk  (04/12/2024)    Readmission Risk Interventions    12/28/2023    5:41 PM 09/15/2023    3:04 PM 04/08/2023    2:11 PM  Readmission Risk Prevention Plan  Transportation Screening Complete Complete Complete  PCP or Specialist Appt within 3-5 Days   Not Complete  HRI or Home Care Consult  Complete  Complete  Social Work Consult for Recovery Care Planning/Counseling  Complete Complete  Palliative Care Screening  Not Applicable Not Applicable  Medication Review Oceanographer) Complete Complete Complete  PCP or Specialist appointment within 3-5 days of discharge Complete    HRI or Home Care Consult Complete    SW Recovery Care/Counseling Consult Complete    Palliative Care Screening Not Applicable    Skilled Nursing Facility Not Applicable

## 2024-04-23 ENCOUNTER — Non-Acute Institutional Stay (SKILLED_NURSING_FACILITY): Payer: Self-pay | Admitting: Adult Health

## 2024-04-23 ENCOUNTER — Encounter: Payer: Self-pay | Admitting: Adult Health

## 2024-04-23 DIAGNOSIS — N390 Urinary tract infection, site not specified: Secondary | ICD-10-CM | POA: Diagnosis not present

## 2024-04-23 DIAGNOSIS — I152 Hypertension secondary to endocrine disorders: Secondary | ICD-10-CM

## 2024-04-23 DIAGNOSIS — E1142 Type 2 diabetes mellitus with diabetic polyneuropathy: Secondary | ICD-10-CM | POA: Diagnosis not present

## 2024-04-23 DIAGNOSIS — K219 Gastro-esophageal reflux disease without esophagitis: Secondary | ICD-10-CM

## 2024-04-23 DIAGNOSIS — K551 Chronic vascular disorders of intestine: Secondary | ICD-10-CM | POA: Diagnosis not present

## 2024-04-23 DIAGNOSIS — E1165 Type 2 diabetes mellitus with hyperglycemia: Secondary | ICD-10-CM

## 2024-04-23 DIAGNOSIS — I700 Atherosclerosis of aorta: Secondary | ICD-10-CM

## 2024-04-23 DIAGNOSIS — E1159 Type 2 diabetes mellitus with other circulatory complications: Secondary | ICD-10-CM | POA: Diagnosis not present

## 2024-04-23 NOTE — Progress Notes (Signed)
 Location:  [name] Room Number: [id number] Place of Service:  SNF (31)   CODE STATUS: dnr   Allergies  Allergen Reactions   Statins Other (See Comments)    Severe myalgias   Phenergan [Promethazine Hcl] Other (See Comments)    Hallucinations    Hydrocortisone Itching   Lisinopril Other (See Comments)    Lethargy, Fatigue   Prednisone  Rash   Trazodone  And Nefazodone Cough    Chief Complaint  Patient presents with   Hospitalization Follow-up    HPI:  She is a 89 year old woman who has been hospitalized from 04-12-24 through 04-22-24. Her past medical history includes: type 2 diabetes mellitus; hypertension; GERD, peripheral neuropathy. 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 She had been recently hospitalized for postprandial abdominal pain. 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 She was seen by IR and vascular surgery and decided celiac artery stenosis was not the cause of her symptoms. Her plan was to follow up with GI as an outpatient. She presented to the ED after a follow up in the IR clinic. She continues to complain of abdominal pain which is burning in nature and had not been able to eat for the past week. She has not had any nausea vomiting diarrhea or constipation. She was sent to the ED for admission and possible stent placement in the celiac artery for her stenosis.  Acute mesenteric ischemia:  Is status post superior mesenteric artery stent on 04-12-24 iva right CFA and celiac artery stent done on 04-15-24 via left CFA access. Both procedures were complicated by right and left femoral artery pseudoaneurysms. The right pseudoaneurysm was injected with thrombin on 04-15-24 due the celiac artery stent placement. On 10-19-125 IR: right still open but even smaller. Would not do anything more- has had that for a year and is not symptomatic. It has  been injected twice daily just because its there and it won't take, probably due to it being chronic and well formed. Will need out patient follow up with Dr. Jennefer in one month.  She is having neck pain because she had to lay flat for 6 hours after her procedure. She is here for short term rehab with her goal to return back home. She will continue to be followed for her chronic illnesses including   Mesenteric artery stenosis:  Hypertension associated with type 2 diabetes mellitus:  Aortic atherosclerosis  Past Medical History:  Diagnosis Date   Blood transfusion without reported diagnosis    Cognitive dysfunction 03/04/2019   Patient scores 21 out of 30 on [place] cognitive assessment September 2020   Diabetes mellitus without complication (HCC)    Diabetic peripheral neuropathy associated with type 2 diabetes mellitus (HCC) 02/25/2022   Diverticulitis    Frailty 03/04/2019   H/O bilateral breast reduction surgery    Hyperlipidemia    a. intolerant to statins.    Hypertension     Past Surgical History:  Procedure Laterality Date   ABDOMINAL HYSTERECTOMY  2003   APPENDECTOMY     age 64   BIOPSY  04/10/2023   Procedure: BIOPSY;  Surgeon: Cinderella Deatrice FALCON, MD;  Location: [facility];  Service: Endoscopy;;   BREAST REDUCTION SURGERY     age 48   COLON SURGERY Left 2011   Hemicolectomy due to diverticulitis   ESOPHAGOGASTRODUODENOSCOPY (EGD) WITH PROPOFOL  N/A 04/10/2023   Procedure: ESOPHAGOGASTRODUODENOSCOPY (  EGD) WITH PROPOFOL ;  Surgeon: Cinderella Deatrice FALCON, MD;  Location: [facility];  Service: Endoscopy;  Laterality: N/A;   EYE SURGERY  03/30/2009   cataract   IR ANGIOGRAM SELECTIVE EACH ADDITIONAL VESSEL  04/15/2024   IR ANGIOGRAM VISCERAL SELECTIVE  04/11/2023   IR INTRAVASCULAR ULTRASOUND NON CORONARY  04/11/2023   IR RADIOLOGIST EVAL & MGMT  04/12/2024   IR TRANSCATH PLC STENT 1ST ART NOT LE CV CAR VERT CAR  04/11/2023   IR TRANSCATH PLC STENT 1ST ART NOT LE CV CAR  VERT CAR  04/15/2024   IR US  GUIDE BX ASP/DRAIN  04/15/2024   IR US  GUIDE BX ASP/DRAIN  04/17/2024   IR US  GUIDE BX ASP/DRAIN  04/17/2024   IR US  GUIDE VASC ACCESS LEFT  04/15/2024   IR US  GUIDE VASC ACCESS RIGHT  04/11/2023   KNEE SURGERY Right 2003   knee cap   LAPAROSCOPIC INCISIONAL / UMBILICAL / VENTRAL HERNIA REPAIR  02/23/2007   REDUCTION MAMMAPLASTY Bilateral 2001   REFRACTIVE SURGERY     TOTAL KNEE ARTHROPLASTY Right 04/10/2022   Procedure: TOTAL KNEE ARTHROPLASTY;  Surgeon: Gerome Charleston, MD;  Location: [facility];  Service: Orthopedics;  Laterality: Right;  adductor canal    Social History   Socioeconomic History   Marital status: Widowed    Spouse name: Not on file   Number of children: Not on file   Years of education: Not on file   Highest education level: Not on file  Occupational History   Not on file  Tobacco Use   Smoking status: Never   Smokeless tobacco: Never  Vaping Use   Vaping status: Never Used  Substance and Sexual Activity   Alcohol  use: No   Drug use: No   Sexual activity: Never    Birth control/protection: Abstinence  Other Topics Concern   Not on file  Social History Narrative   Not on file   Social Drivers of Health   Financial Resource Strain: Low Risk  (09/06/2022)   Overall Financial Resource Strain (CARDIA)    Difficulty of Paying Living Expenses: Not hard at all  Food Insecurity: No Food Insecurity (04/12/2024)   Hunger Vital Sign    Worried About Running Out of Food in the Last Year: Never true    Ran Out of Food in the Last Year: Never true  Transportation Needs: No Transportation Needs (04/22/2024)   PRAPARE - Administrator, Civil Service (Medical): No    Lack of Transportation (Non-Medical): No  Physical Activity: Sufficiently Active (09/06/2022)   Exercise Vital Sign    Days of Exercise per Week: 5 days    Minutes of Exercise per Session: 30 min  Stress: No Stress Concern Present (09/06/2022)   [employer] of  Occupational Health - Occupational Stress Questionnaire    Feeling of Stress : Not at all  Social Connections: Moderately Isolated (04/12/2024)   Social Connection and Isolation Panel    Frequency of Communication with Friends and Family: More than three times a week    Frequency of Social Gatherings with Friends and Family: More than three times a week    Attends Religious Services: 1 to 4 times per year    Active Member of [employer] or Organizations: No    Attends Banker Meetings: Never    Marital Status: Widowed  Intimate Partner Violence: Not At Risk (04/12/2024)   Humiliation, Afraid, Rape, and Kick questionnaire    Fear of Current or Ex-Partner: No  Emotionally Abused: No    Physically Abused: No    Sexually Abused: No   Family History  Problem Relation Age of Onset   Cancer Mother        ovaian   Hypertension Father        kidney   Stroke Father    Cancer Father    Hypertension Sister    Diabetes Brother       VITAL SIGNS BP 132/71   Pulse 79   Temp 98.1 F (36.7 C)   Resp 18   Ht 5' 8 (1.727 m)   Wt 173 lb 12.8 oz (78.8 kg)   SpO2 96%   BMI 26.43 kg/m   Outpatient Encounter Medications as of 04/23/2024  Medication Sig   acetaminophen  (TYLENOL ) 325 MG tablet TAKE (2) TABLETS BY MOUTH EVERY SIX HOURS AS NEEDED FOR PAIN. MAX 3GM IN 24 HOURS.   albuterol  (PROVENTIL ) (2.5 MG/3ML) 0.083% nebulizer solution Inhale 3 mLs (2.5 mg total) into the lungs every 6 (six) hours as needed for wheezing or shortness of breath.   ALPRAZolam  (XANAX ) 0.25 MG tablet Take 1 tablet (0.25 mg total) by mouth at bedtime as needed for anxiety.   amLODipine  (NORVASC ) 10 MG tablet TAKE (1) TABLET BY MOUTH ONCE DAILY.   aspirin  (ASPIRIN  LOW DOSE) 81 MG chewable tablet CHEW (1) TABLET BY MOUTH ONCE DAILY.   BETA CAROTENE PROVITAMIN A 25000 units capsule TAKE (1) CAPSULE BY MOUTH AT BEDTIME.   cyanocobalamin  1000 MCG tablet Take 1,000 mcg by mouth at bedtime.   cycloSPORINE   (RESTASIS ) 0.05 % ophthalmic emulsion Place 1 drop into both eyes 2 (two) times daily.   estradiol  (ESTRACE ) 0.1 MG/GM vaginal cream Discard plastic applicator. Insert a blueberry size amount (approximately 1 gram) of cream on fingertip inside vagina at bedtime every night for 1 week then every other night. For long term use.   feeding supplement (ENSURE PLUS HIGH PROTEIN) LIQD Take 237 mLs by mouth 2 (two) times daily between meals.   gabapentin  (NEURONTIN ) 100 MG capsule Take 1 capsule (100 mg total) by mouth daily.   glucose blood (EASYMAX TEST) test strip CHECK BLOOD SUGAR ONCE DAILY.(CALL MD IF BS BELOW 60: OR IF BS ABOVE 400)   hydrALAZINE  (APRESOLINE ) 25 MG tablet Take 1 tablet (25 mg total) by mouth 3 (three) times daily.   IVIZIA DRY EYES 0.5 % SOLN Place 1 drop into both eyes 2 (two) times daily.   ketoconazole  (NIZORAL ) 2 % cream Apply 1 Application topically 2 (two) times daily. For 10 days to groin rash (Patient not taking: Reported on 04/13/2024)   lactobacillus (FLORANEX/LACTINEX) PACK Take 1 packet (1 g total) by mouth 3 (three) times daily with meals.   lidocaine  (LIDODERM ) 5 % Place 1 patch onto the skin daily. Remove & Discard patch within 12 hours or as directed by MD   Melatonin 10 MG TABS Take 10 mg by mouth at bedtime.   metFORMIN  (GLUCOPHAGE ) 500 MG tablet TAKE (1) TABLET BY MOUTH IN THE MORNING & (1/2) TABLET (250MG ) BY MOUTH AT SUPPER   methenamine  (HIPREX ) 1 g tablet Take 1 tablet (1 g total) by mouth 2 (two) times daily with a meal. Most effective when taken with a daily Vitamin C supplement.   Multiple Vitamins-Minerals (PRESERVISION AREDS 2) CAPS Take 1 capsule by mouth in the morning and at bedtime.   mupirocin  ointment (BACTROBAN ) 2 % Apply thin amount twice daily to rash in groin creases twice daily for 5 days at  a time when rash occurs   ondansetron  (ZOFRAN ) 4 MG tablet Take 1 tablet (4 mg total) by mouth every 6 (six) hours as needed for nausea or vomiting.    oxyCODONE  (ROXICODONE ) 5 MG immediate release tablet Take 1 tablet (5 mg total) by mouth every 8 (eight) hours as needed for severe pain (pain score 7-10).   pantoprazole  (PROTONIX ) 40 MG tablet Take 1 tablet (40 mg total) by mouth 2 (two) times daily before a meal.   polyethylene glycol (MIRALAX  / GLYCOLAX ) 17 g packet Take 17 g by mouth daily as needed for moderate constipation.   Safety Lancets 28G MISC CHECK BLOOD SUGAR ONCE DAILY.   tamsulosin  (FLOMAX ) 0.4 MG CAPS capsule Take 1 capsule (0.4 mg total) by mouth daily.   Vitamin D , Cholecalciferol , 10 MCG (400 UNIT) TABS TAKE (1) TABLET BY MOUTH ONCE DAILY.   Vitamins A & D (VITAMIN A & D) ointment Apply topically as needed for dry skin (Leg).   No facility-administered encounter medications on file as of 04/23/2024.     SIGNIFICANT DIAGNOSTIC EXAMS  TODAY  04-12-24: Wbc 7.1; hgb 11.4; hct 35.3; mcv 85.9 plt 322; glucose 133; bun 15; creat 1.02; k+ 3.7; na++ 138; ca 9.4; gfr 52; protein 6.6 albumin 3.8 04-15-24: wbc 5.2; hgb 10.7; hct 33.7; mcv 86.4 plt 254; glucose 116; bun 18; creat 1.00; k+ 3.9; na++ 134; ca 8.8; gfr 54 04-21-24: wbc 8.2; hgb 9.0; hct 28.6; mcv 87.7 plt 240; glucose 160; bun 25; creat 0.92; k+ 4.4; na++ 136; ca 9.0; gfr 59   Review of Systems  Constitutional:  Negative for malaise/fatigue.  Respiratory:  Negative for cough and shortness of breath.   Cardiovascular:  Negative for chest pain, palpitations and leg swelling.  Gastrointestinal:  Negative for abdominal pain, constipation and heartburn.  Musculoskeletal:  Positive for joint pain. Negative for back pain and myalgias.       Neck pain:  I had to lay flat for 6 hours  Skin: Negative.   Neurological:  Negative for dizziness.  Psychiatric/Behavioral:  The patient is not nervous/anxious.    Physical Exam Constitutional:      General: She is not in acute distress.    Appearance: She is well-developed. She is not diaphoretic.  Neck:     Thyroid : No  thyromegaly.     Comments: Neck is stiff due to pain  Cardiovascular:     Rate and Rhythm: Normal rate and regular rhythm.     Heart sounds: Normal heart sounds.  Pulmonary:     Effort: Pulmonary effort is normal. No respiratory distress.     Breath sounds: Normal breath sounds.  Abdominal:     General: Bowel sounds are normal. There is no distension.     Palpations: Abdomen is soft.     Tenderness: There is no abdominal tenderness.  Musculoskeletal:        General: Normal range of motion.     Cervical back: Neck supple.     Right lower leg: No edema.     Left lower leg: No edema.  Lymphadenopathy:     Cervical: No cervical adenopathy.  Skin:    General: Skin is warm and dry.  Neurological:     Mental Status: She is alert and oriented to person, place, and time.     Comments: 04-18-23: bcat 44/50    Psychiatric:        Mood and Affect: Mood normal.     ASSESSMENT/ PLAN:  TODAY  Mesenteric  artery stenosis: is status post stent placement. She is here for short term rehab with her goal to return back home   2.  Hypertension associated with type 2 diabetes mellitus: b/p 132/71: will continue norvasc  10 mg daily and hydralazine  25 mg three times daily   3. Aortic atherosclerosis: (ct 04-10-23) is on asa; not on statin due to advanced age  30. Gastroesophageal reflux disease without esophagitis: will continue prilosec 40 mg twice daily   5.  Type 2 diabetes mellitus with hyperglycemia without long term current use of insulin : will continue metformin  500 mg in the AM and 250 mg in the pm.   6. Neck pain: is acute; has been started on oxycodone  5 mg every 8 hours as needed while int he hospital; will begin biofreeze three times daily and will monitor her status.   7. Diabetic polyneuropathy associated with type 2 diabetes mellitus: will continue gabapentin  100 mg daily   8. Recurrent UTI: will continue methenamine  1 gm twice daily is on flomax  0.4 mg daily for retention  Will  recheck hgb/hct   Barnie Seip NP [facility]   call [phone]

## 2024-04-25 ENCOUNTER — Emergency Department (HOSPITAL_COMMUNITY)
Admission: EM | Admit: 2024-04-25 | Discharge: 2024-04-25 | Disposition: A | Discharge status: Home / Self Care | Source: Skilled Nursing Facility | Attending: Emergency Medicine | Admitting: Emergency Medicine

## 2024-04-25 ENCOUNTER — Emergency Department (HOSPITAL_COMMUNITY)

## 2024-04-25 ENCOUNTER — Other Ambulatory Visit: Payer: Self-pay

## 2024-04-25 ENCOUNTER — Encounter (HOSPITAL_COMMUNITY): Payer: Self-pay | Admitting: *Deleted

## 2024-04-25 ENCOUNTER — Telehealth: Payer: Self-pay | Admitting: Family

## 2024-04-25 DIAGNOSIS — R630 Anorexia: Secondary | ICD-10-CM | POA: Insufficient documentation

## 2024-04-25 DIAGNOSIS — R1084 Generalized abdominal pain: Secondary | ICD-10-CM | POA: Diagnosis not present

## 2024-04-25 DIAGNOSIS — Z79899 Other long term (current) drug therapy: Secondary | ICD-10-CM | POA: Diagnosis not present

## 2024-04-25 DIAGNOSIS — I701 Atherosclerosis of renal artery: Secondary | ICD-10-CM | POA: Diagnosis not present

## 2024-04-25 DIAGNOSIS — Z7982 Long term (current) use of aspirin: Secondary | ICD-10-CM | POA: Insufficient documentation

## 2024-04-25 DIAGNOSIS — E1140 Type 2 diabetes mellitus with diabetic neuropathy, unspecified: Secondary | ICD-10-CM | POA: Diagnosis not present

## 2024-04-25 DIAGNOSIS — Z743 Need for continuous supervision: Secondary | ICD-10-CM | POA: Diagnosis not present

## 2024-04-25 DIAGNOSIS — I724 Aneurysm of artery of lower extremity: Secondary | ICD-10-CM | POA: Diagnosis not present

## 2024-04-25 DIAGNOSIS — Z7984 Long term (current) use of oral hypoglycemic drugs: Secondary | ICD-10-CM | POA: Insufficient documentation

## 2024-04-25 DIAGNOSIS — R109 Unspecified abdominal pain: Secondary | ICD-10-CM | POA: Diagnosis not present

## 2024-04-25 DIAGNOSIS — I10 Essential (primary) hypertension: Secondary | ICD-10-CM | POA: Diagnosis not present

## 2024-04-25 DIAGNOSIS — R1030 Lower abdominal pain, unspecified: Secondary | ICD-10-CM | POA: Diagnosis not present

## 2024-04-25 DIAGNOSIS — R197 Diarrhea, unspecified: Secondary | ICD-10-CM | POA: Insufficient documentation

## 2024-04-25 LAB — CBC WITH DIFFERENTIAL/PLATELET
Abs Immature Granulocytes: 0.04 K/uL (ref 0.00–0.07)
Basophils Absolute: 0 K/uL (ref 0.0–0.1)
Basophils Relative: 1 %
Eosinophils Absolute: 0.3 K/uL (ref 0.0–0.5)
Eosinophils Relative: 3 %
HCT: 31.7 % — ABNORMAL LOW (ref 36.0–46.0)
Hemoglobin: 10.1 g/dL — ABNORMAL LOW (ref 12.0–15.0)
Immature Granulocytes: 1 %
Lymphocytes Relative: 16 %
Lymphs Abs: 1.3 K/uL (ref 0.7–4.0)
MCH: 27.9 pg (ref 26.0–34.0)
MCHC: 31.9 g/dL (ref 30.0–36.0)
MCV: 87.6 fL (ref 80.0–100.0)
Monocytes Absolute: 0.5 K/uL (ref 0.1–1.0)
Monocytes Relative: 7 %
Neutro Abs: 6.1 K/uL (ref 1.7–7.7)
Neutrophils Relative %: 72 %
Platelets: 362 K/uL (ref 150–400)
RBC: 3.62 MIL/uL — ABNORMAL LOW (ref 3.87–5.11)
RDW: 15 % (ref 11.5–15.5)
WBC: 8.3 K/uL (ref 4.0–10.5)
nRBC: 0 % (ref 0.0–0.2)

## 2024-04-25 LAB — LIPASE, BLOOD: Lipase: 12 U/L (ref 11–51)

## 2024-04-25 LAB — COMPREHENSIVE METABOLIC PANEL WITH GFR
ALT: 9 U/L (ref 0–44)
AST: 16 U/L (ref 15–41)
Albumin: 3.8 g/dL (ref 3.5–5.0)
Alkaline Phosphatase: 94 U/L (ref 38–126)
Anion gap: 15 (ref 5–15)
BUN: 20 mg/dL (ref 8–23)
CO2: 23 mmol/L (ref 22–32)
Calcium: 9.8 mg/dL (ref 8.9–10.3)
Chloride: 99 mmol/L (ref 98–111)
Creatinine, Ser: 0.84 mg/dL (ref 0.44–1.00)
GFR, Estimated: >60 mL/min (ref >60)
Glucose, Bld: 135 mg/dL — ABNORMAL HIGH (ref 70–99)
Potassium: 4.2 mmol/L (ref 3.5–5.1)
Sodium: 137 mmol/L (ref 135–145)
Total Bilirubin: 0.5 mg/dL (ref 0.0–1.2)
Total Protein: 7.3 g/dL (ref 6.5–8.1)

## 2024-04-25 LAB — URINALYSIS, ROUTINE W REFLEX MICROSCOPIC
Bilirubin Urine: NEGATIVE
Glucose, UA: NEGATIVE mg/dL
Hgb urine dipstick: NEGATIVE
Ketones, ur: NEGATIVE mg/dL
Leukocytes,Ua: NEGATIVE
Nitrite: NEGATIVE
Protein, ur: NEGATIVE mg/dL
Specific Gravity, Urine: 1.019 (ref 1.005–1.030)
pH: 8 (ref 5.0–8.0)

## 2024-04-25 LAB — LACTIC ACID, PLASMA: Lactic Acid, Venous: 1.3 mmol/L (ref 0.5–1.9)

## 2024-04-25 LAB — MAGNESIUM: Magnesium: 2 mg/dL (ref 1.7–2.4)

## 2024-04-25 MED ORDER — LACTATED RINGERS IV BOLUS
500.0000 mL | Freq: Once | INTRAVENOUS | Status: AC
  Administered 2024-04-25: 500 mL via INTRAVENOUS

## 2024-04-25 MED ORDER — HYDROMORPHONE HCL 1 MG/ML IJ SOLN
0.5000 mg | Freq: Once | INTRAMUSCULAR | Status: AC
  Administered 2024-04-25: 0.5 mg via INTRAVENOUS
  Filled 2024-04-25: qty 0.5

## 2024-04-25 MED ORDER — OXYCODONE HCL 5 MG PO TABS: 5.0000 mg | ORAL_TABLET | ORAL | 0 refills | Status: AC | PRN

## 2024-04-25 MED ORDER — ONDANSETRON HCL 4 MG/2ML IJ SOLN
4.0000 mg | Freq: Four times a day (QID) | INTRAMUSCULAR | Status: DC | PRN
  Administered 2024-04-25: 4 mg via INTRAVENOUS
  Filled 2024-04-25: qty 2

## 2024-04-25 MED ORDER — IOHEXOL 350 MG/ML SOLN
100.0000 mL | Freq: Once | INTRAVENOUS | Status: AC | PRN
  Administered 2024-04-25: 100 mL via INTRAVENOUS

## 2024-04-25 NOTE — Discharge Instructions (Addendum)
 Your test results today are reassuring.  Previously, you were prescribed a 5 mg narcotic pain medication every 8 hours as needed.  You can take this every 4 hours as needed.  A new prescription was sent to your pharmacy.  Follow-up with your outpatient doctors as scheduled.  Return to the emergency department for any new or worsening symptoms of concern.

## 2024-04-25 NOTE — ED Triage Notes (Signed)
 Pt with continue lower abd but worse since last night. Pt is from [facility]. Pt c/o burning like before her stents were placed. +belching, two loose stools per pt this morning.

## 2024-04-25 NOTE — Telephone Encounter (Signed)
 Facility Nurse called states patient complains of right abdominal pain rating 8 of 10 on scale.Abdomen very tender to palpation.states had similar symptoms prior to her recent Mesenteric aortic stenosis surgery.B/p 161/62.Order give to send to the ED for evaluation.

## 2024-04-25 NOTE — ED Provider Notes (Signed)
 [place] [facility]ERGENC[facility] DEPARTMENT AT [facility] Provider Note   CSN: [id number] Arrival date & time: 04/25/24  1209     Patient presents with: Abdominal Pain   [name] is a 89 y.o. female.    Abdominal Pain Associated symptoms: diarrhea   Patient presenting with abdominal pain.  Medical history includes HTN, DM, anxiety, cognitive dysfunction, GERD, neuropathy.  She was admitted to the hospital 2 weeks ago for mesenteric ischemia.  She underwent SMA stenting on 10/13 and celiac artery stenting on 10/16.  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  Right pseudoaneurysm was injected with thrombin during a celiac artery stent placement procedure.  She was discharged to [facility] 3 days ago.  Last night, she had recurrence of lower abdominal pain.  She has not been able to eat today due to loss of appetite.  She had 2 loose bowel movements this morning.    Prior to Admission medications   Medication Sig Start Date End Date Taking? Authorizing Provider  acetaminophen  (TYLENOL ) 325 MG tablet TAKE (2) TABLETS BY MOUTH EVERY SIX HOURS AS NEEDED FOR PAIN. MAX 3GM IN 24 HOURS. 07/28/23   [name], MD  albuterol  (PROVENTIL ) (2.5 MG/3ML) 0.083% nebulizer solution Inhale 3 mLs (2.5 mg total) into the lungs every 6 (six) hours as needed for wheezing or shortness of breath. 04/22/24   Rashid, Farhan, MD  ALPRAZolam  (XANAX ) 0.25 MG tablet Take 1 tablet (0.25 mg total) by mouth at bedtime as needed for anxiety. 04/22/24   Rashid, Farhan, MD  amLODipine  (NORVASC ) 10 MG tablet TAKE (1) TABLET BY MOUTH ONCE DAILY. 10/07/23   [name], MD  aspirin  (ASPIRIN  LOW DOSE) 81 MG chewable tablet CHEW (1) TABLET BY MOUTH ONCE DAILY. 09/06/23   [name], MD  BETA CAROTENE PROVITAMIN A 25000 units capsule TAKE (1) CAPSULE BY MOUTH AT BEDTIME. 02/24/24   [name], MD  cyanocobalamin  1000 MCG tablet Take 1,000 mcg by mouth at bedtime.    [provider]  cycloSPORINE  (RESTASIS ) 0.05 % ophthalmic emulsion Place 1 drop into both eyes 2 (two) times daily. 04/22/24   Rashid, Farhan, MD  estradiol  (ESTRACE ) 0.1 MG/GM vaginal cream Discard plastic applicator. Insert a blueberry size amount (approximately 1 gram) of cream on fingertip inside vagina at bedtime every night for 1 week then every other night. For long term use. 01/15/24   Gerldine Lauraine BROCKS, FNP  feeding supplement (ENSURE PLUS HIGH PROTEIN) LIQD Take 237 mLs by mouth 2 (two) times daily between meals. 04/22/24   Rashid, Farhan, MD  gabapentin  (NEURONTIN ) 100 MG capsule Take 1 capsule (100 mg total) by mouth daily. 04/23/24   Dino Antu, MD  glucose blood (EASYMAX TEST) test strip CHECK BLOOD SUGAR ONCE DAILY.(CALL MD IF BS BELOW 60: OR IF BS ABOVE 400) 10/07/23   Luking, Glendia LABOR, MD  hydrALAZINE  (APRESOLINE ) 25 MG tablet Take 1 tablet (25 mg total) by mouth 3 (three) times daily. 12/31/23   Luking, Glendia LABOR, MD  IVIZIA DRY EYES 0.5 % SOLN Place 1 drop into both eyes 2 (two) times daily. 09/22/23   [provider]  ketoconazole  (NIZORAL ) 2 % cream Apply 1 Application topically 2 (two) times daily. For 10 days to groin rash Patient not taking: Reported on 04/13/2024 03/03/24   [name], MD  lactobacillus (FLORANEX/LACTINEX) PACK Take 1 packet (1 g total) by mouth 3 (three) times daily with meals. 04/22/24   Dino Antu, MD  lidocaine  (LIDODERM ) 5 % Place 1 patch onto the skin daily. Remove & Discard patch within 12 hours or as directed by MD 04/22/24   Rashid, Farhan, MD  Melatonin 10 MG TABS Take 10 mg by mouth at bedtime. 03/24/24   Grooms, Courtney, PA-C  metFORMIN  (GLUCOPHAGE ) 500 MG tablet TAKE (1) TABLET BY MOUTH IN THE MORNING & (1/2) TABLET (250MG ) BY MOUTH AT SUPPER 10/07/23   Luking, Glendia LABOR, MD  methenamine  (HIPREX ) 1 g tablet Take 1 tablet (1 g total) by mouth 2 (two) times daily with a meal. Most effective when taken with a daily Vitamin C supplement. 01/15/24   Gerldine Lauraine BROCKS, FNP  Multiple Vitamins-Minerals (PRESERVISION AREDS 2) CAPS Take 1 capsule by mouth in the morning and at bedtime.    [provider]  mupirocin  ointment (BACTROBAN ) 2 % Apply thin amount twice daily to rash in groin creases twice daily for 5 days at a time when rash occurs 03/07/24   [name], MD  ondansetron  (ZOFRAN ) 4 MG tablet Take 1 tablet (4 mg total) by mouth every 6 (six) hours as needed for nausea or vomiting. 04/22/24   Dino Antu, MD  oxyCODONE  (ROXICODONE ) 5 MG immediate release tablet Take 1 tablet (5 mg total) by mouth every 4 (four) hours as needed for up to 5 days for severe pain (pain score 7-10). 04/25/24 04/30/24  Melvenia Motto, MD  pantoprazole  (PROTONIX ) 40 MG tablet Take 1 tablet (40 mg total) by mouth 2 (two) times daily before a meal. 12/24/23   Cook, Jayce G, DO  polyethylene glycol (MIRALAX  / GLYCOLAX ) 17 g packet Take 17 g by mouth daily as needed for moderate constipation. 04/22/24   Dino Antu, MD  Safety Lancets 28G MISC CHECK BLOOD SUGAR ONCE DAILY. 01/21/24   [name], MD  tamsulosin  (FLOMAX ) 0.4 MG CAPS capsule Take 1 capsule (0.4 mg total) by mouth daily. 03/08/24   Matilda Senior, MD  Vitamin D , Cholecalciferol , 10 MCG (400 UNIT) TABS TAKE (1) TABLET BY MOUTH ONCE DAILY. 10/07/23   [name], MD  Vitamins A & D (VITAMIN A & D) ointment Apply topically as needed for dry skin (Leg). 04/22/24   Rashid, Farhan, MD    Allergies: Statins, Phenergan [promethazine hcl], Hydrocortisone, Lisinopril, Prednisone , and Trazodone  and nefazodone    Review of Systems  Constitutional:  Positive for appetite change.  Gastrointestinal:  Positive for abdominal pain and diarrhea.  All other systems reviewed and are negative.   Updated Vital Signs BP (!) 151/53 (BP Location: Right Arm)   Pulse 67   Temp 97.7 F (36.5 C) (Oral)   Resp 14   Ht 5' 8 (1.727 m)   Wt 77.6 kg   SpO2 95%   BMI 26.00 kg/m   Physical Exam Vitals and nursing  note reviewed.  Constitutional:      General: She is not in acute distress.    Appearance: She is well-developed. She is not ill-appearing, toxic-appearing or diaphoretic.  HENT:     Head: Normocephalic and atraumatic.     Mouth/Throat:     Mouth: Mucous membranes are moist.  Eyes:     Conjunctiva/sclera: Conjunctivae normal.  Cardiovascular:     Rate and Rhythm: Normal rate and regular rhythm.  Pulmonary:     Effort: Pulmonary effort is normal. No respiratory distress.  Abdominal:     Palpations: Abdomen is soft.     Tenderness: There is generalized abdominal tenderness. There is no guarding or  rebound.  Musculoskeletal:        General: No swelling.     Cervical back: Neck supple.  Skin:    General: Skin is warm and dry.     Coloration: Skin is not cyanotic or jaundiced.  Neurological:     General: No focal deficit present.     Mental Status: She is alert and oriented to person, place, and time.  Psychiatric:        Mood and Affect: Mood normal.        Behavior: Behavior normal.     (all labs ordered are listed, but only abnormal results are displayed) Labs Reviewed  COMPREHENSIVE METABOLIC PANEL WITH GFR - Abnormal; Notable for the following components:      Result Value   Glucose, Bld 135 (*)    All other components within normal limits  CBC WITH DIFFERENTIAL/PLATELET - Abnormal; Notable for the following components:   RBC 3.62 (*)    Hemoglobin 10.1 (*)    HCT 31.7 (*)    All other components within normal limits  URINALYSIS, ROUTINE W REFLEX MICROSCOPIC - Abnormal; Notable for the following components:   Color, Urine STRAW (*)    All other components within normal limits  LIPASE, BLOOD  LACTIC ACID, PLASMA  MAGNESIUM     EKG: EKG Interpretation Date/Time:  Sunday April 25 2024 13:48:21 EDT Ventricular Rate:  62 PR Interval:  197 QRS Duration:  79 QT Interval:  430 QTC Calculation: 437 R Axis:   41  Text Interpretation: Sinus rhythm Confirmed by  Melvenia Motto ([id number]) on 04/25/2024 2:21:15 PM  Radiology: CT Angio Abd/Pel W and/or Wo Contrast Result Date: 04/25/2024 CLINICAL DATA:  Mesenteric ischemia, acute. Persistent lower abdominal pain and loose stools. EXAM: CTA ABDOMEN AND PELVIS WITHOUT AND WITH CONTRAST TECHNIQUE: Multidetector CT imaging of the abdomen and pelvis was performed using the standard protocol during bolus administration of intravenous contrast. Multiplanar reconstructed images and MIPs were obtained and reviewed to evaluate the vascular anatomy. RADIATION DOSE REDUCTION: This exam was performed according to the departmental dose-optimization program which includes automated exposure control, adjustment of the mA and/or kV according to patient size and/or use of iterative reconstruction technique. CONTRAST:  OMNIPAQUE  IOHEXOL  350 MG/ML SOLN COMPARISON:  04/16/2024. FINDINGS: VASCULAR Aorta: Normal caliber aorta without aneurysm, dissection, vasculitis or significant stenosis. Celiac: Celiac artery stent in place appears patent without evidence of stenosis. The a [place] and splenic arteries enhance normally. SMA: SMA stent in place is patent. Renals: A severe stenosis at of the renal artery ostia bilaterally, unchanged from the previous exam. IMA: Patent without evidence of aneurysm, dissection, vasculitis or significant stenosis. Inflow: Patent without evidence of aneurysm, dissection, vasculitis or significant stenosis. Proximal Outflow: Bilateral common femoral and visualized portions of the superficial and profunda femoral arteries are patent without evidence of aneurysm, dissection, vasculitis or significant stenosis. A right common femoral artery pseudoaneurysm is noted measuring up to 2.3 cm, slightly decreased in size from the prior exam. Mixed attenuation hematoma in the left groin appear stable and without evidence of active contrast extravasation. Veins: No obvious venous abnormality within the limitations of this  arterial phase study. No portal venous gas is seen. Review of the MIP images confirms the above findings. NON-VASCULAR Lower chest: No portal venous gas or other acute abnormality Hepatobiliary: A stable subcentimeter hypodensity is present in the right lobe the liver, likely cyst. Multiple stones are noted in the gallbladder. No biliary ductal dilatation. Pancreas: Unremarkable. No pancreatic ductal dilatation  or surrounding inflammatory changes. Spleen: Normal in size without focal abnormality. Adrenals/Urinary Tract: The adrenal glands are within normal limits. The kidneys enhance symmetrically. Stable scattered hypodensities are present in the kidneys. No renal calculus or hydronephrosis bilaterally. The bladder is within normal limits. Stomach/Bowel: The stomach is within normal limits. No bowel obstruction, free air, or pneumatosis is seen. Scattered diverticula are present along the colon without evidence of diverticulitis. No bowel wall thickening or inflammatory changes are seen. Appendix is not seen. Lymphatic: No abdominal or pelvic lymphadenopathy by size criteria. Reproductive: Status post hysterectomy. No adnexal masses. Other: No abdominopelvic ascites. Subcutaneous fat stranding is noted about the anterior left thigh. Evidence of prior abdominal wall hernia repair is noted. Musculoskeletal: Degenerative changes are present in the thoracolumbar spine. No acute osseous abnormality. IMPRESSION: VASCULAR 1. Celiac and SMA artery stents are patent. 2. Right common femoral pseudoaneurysm, slightly smaller as compared with prior exam. 3. Left groin pseudoaneurysm is not seen in there is no evidence of active hemorrhage at this time. 4. Aortic atherosclerosis. 5. Stable bilateral severe renal artery stenosis. NON-VASCULAR 1. No evidence of mesenteric ischemia or other acute process. 2. Stable left groin hematoma with no definite evidence of contrast extravasation or pseudoaneurysm at this time. 3.  Diverticulosis without diverticulitis. 4. Cholelithiasis. Electronically Signed   By: Leita Birmingham M.D.   On: 04/25/2024 15:39     Procedures   Medications Ordered in the ED  ondansetron  (ZOFRAN ) injection 4 mg (4 mg Intravenous Given 04/25/24 1317)  HYDROmorphone  (DILAUDID ) injection 0.5 mg (has no administration in time range)  lactated ringers  bolus 500 mL (0 mLs Intravenous Stopped 04/25/24 1427)  HYDROmorphone  (DILAUDID ) injection 0.5 mg (0.5 mg Intravenous Given 04/25/24 1318)  iohexol  (OMNIPAQUE ) 350 MG/ML injection 100 mL (100 mLs Intravenous Contrast Given 04/25/24 1443)                                    Medical Decision Making Amount and/or Complexity of Data Reviewed Labs: ordered. Radiology: ordered.  Risk Prescription drug management.   This patient presents to the ED for concern of abdominal pain, this involves an extensive number of treatment options, and is a complaint that carries with it a high risk of complications and morbidity.  The differential diagnosis includes mesenteric ischemia, bowel obstruction, intra-abdominal infection, colitis   Co morbidities / Chronic conditions that complicate the patient evaluation  HTN, DM, anxiety, cognitive dysfunction, GERD, neuropathy   Additional history obtained:  Additional history obtained from EMR External records from outside source obtained and reviewed including patient's son   Lab Tests:  I Ordered, and personally interpreted labs.  The pertinent results include: Baseline hemoglobin, no leukocytosis, normal kidney function, normal electrolytes   Imaging Studies ordered:  I ordered imaging studies including CTA of abdomen and pelvis I independently visualized and interpreted imaging which showed patent celiac and SMA stents, no acute findings I agree with the radiologist interpretation   Cardiac Monitoring: / EKG:  The patient was maintained on a cardiac monitor.  I personally viewed and  interpreted the cardiac monitored which showed an underlying rhythm of: Sinus rhythm   Problem List / ED Course / Critical interventions / Medication management  Patient presenting for lower abdominal pain.  She has a recent history of celiac artery and SMA artery stenting.  She was discharged from hospital 3 days ago but had recurrence of lower abdominal pain overnight.  On arrival in the ED, patient appears uncomfortable.  Vital signs notable for moderate hypertension.  Patient describes a generalized abdominal pain that is worse in the lower aspect.  Tenderness is present.  Dilaudid  was ordered for analgesia.  Workup was initiated.  Lab work and CT imaging were negative for any acute findings.  Patient's bladder was moderately distended.  On reassessment, patient's pain has improved.  Patient was able to urinate 500 cc after CT scan.  Postvoid residual bladder scan showed 100 cc.  I do not suspect clinically significant urine retention.  Urinalysis did not show evidence of infection.  Patient and son were informed of reassuring workup results.  Patient is currently prescribed 5 mg of oxycodone  every 8 hours as needed.  Will increase to every 4 hours as needed. I ordered medication including Dilaudid  for analgesia, IV fluids for hydration, Zofran  for nausea Reevaluation of the patient after these medicines showed that the patient improved I have reviewed the patients home medicines and have made adjustments as needed   Social Determinants of Health:  Currently residing at [facility] nursing facility     Final diagnoses:  Lower abdominal pain    ED Discharge Orders          Ordered    oxyCODONE  (ROXICODONE ) 5 MG immediate release tablet  Every 4 hours PRN        04/25/24 1634               Melvenia Motto, MD 04/25/24 1636

## 2024-04-25 NOTE — ED Notes (Signed)
 Pt gone to CT

## 2024-04-26 ENCOUNTER — Other Ambulatory Visit (HOSPITAL_COMMUNITY)
Admission: RE | Admit: 2024-04-26 | Discharge: 2024-04-26 | Disposition: A | Discharge status: Home / Self Care | Source: Skilled Nursing Facility | Attending: Adult Health | Admitting: Adult Health

## 2024-04-26 DIAGNOSIS — D62 Acute posthemorrhagic anemia: Secondary | ICD-10-CM | POA: Insufficient documentation

## 2024-04-26 LAB — HEMOGLOBIN AND HEMATOCRIT, BLOOD
HCT: 32.8 % — ABNORMAL LOW (ref 36.0–46.0)
Hemoglobin: 10.2 g/dL — ABNORMAL LOW (ref 12.0–15.0)

## 2024-04-27 ENCOUNTER — Institutional Professional Consult (permissible substitution) (INDEPENDENT_AMBULATORY_CARE_PROVIDER_SITE_OTHER): Admitting: Otolaryngology

## 2024-04-27 ENCOUNTER — Non-Acute Institutional Stay (SKILLED_NURSING_FACILITY): Payer: Self-pay | Admitting: Internal Medicine

## 2024-04-27 ENCOUNTER — Encounter: Payer: Self-pay | Admitting: Internal Medicine

## 2024-04-27 DIAGNOSIS — I700 Atherosclerosis of aorta: Secondary | ICD-10-CM

## 2024-04-27 DIAGNOSIS — D62 Acute posthemorrhagic anemia: Secondary | ICD-10-CM | POA: Diagnosis not present

## 2024-04-27 DIAGNOSIS — K551 Chronic vascular disorders of intestine: Secondary | ICD-10-CM | POA: Diagnosis not present

## 2024-04-27 DIAGNOSIS — E1159 Type 2 diabetes mellitus with other circulatory complications: Secondary | ICD-10-CM

## 2024-04-27 DIAGNOSIS — I10 Essential (primary) hypertension: Secondary | ICD-10-CM | POA: Diagnosis not present

## 2024-04-27 DIAGNOSIS — R238 Other skin changes: Secondary | ICD-10-CM | POA: Insufficient documentation

## 2024-04-27 NOTE — Assessment & Plan Note (Addendum)
 Systolic blood pressure is slightly elevated but this is in the context of intermittent abdominal pain especially after OT/PT sessions.  Additionally this is in the context of renal artery stenosis.  Continued the vasodilating calcium channel blocker and escalate antihypertensive regimen if there is persistent significant systolic blood pressure elevation.

## 2024-04-27 NOTE — Assessment & Plan Note (Signed)
 I recommended Dermatology referral by her PCP post rehab completion her @ SNF . Moh's surgery may be clinically indicated.

## 2024-04-27 NOTE — Patient Instructions (Signed)
 See assessment and plan under each diagnosis in the problem list and acutely for this visit

## 2024-04-27 NOTE — Progress Notes (Signed)
 NURSING HOME LOCATION:  [facility] ROOM NUMBER: [id number]  CODE STATUS: DNR  PCP: Glendia LABOR. Luking MD  This is a comprehensive admission note to this SNFperformed on this date less than 30 days from date of admission. Included are preadmission medical/surgical history; reconciled medication list; family history; social history and comprehensive review of systems.  Corrections and additions to the records were documented. Comprehensive physical exam was also performed. Additionally a clinical summary was entered for each active diagnosis pertinent to this admission in the Problem List to enhance continuity of care.  HPI: She was hospitalized 10/13 - 04/22/2024 for acute mesenteric ischemia.  Superior mesenteric artery stent was performed 10/13 via the right CFA access and celiac artery stent performed on 10/16 via the left CFA access.  Both procedures were complicated by right and left femoral artery pseudoaneurysms.  The right pseudoaneurysm was injected with thrombin on 10/16 during the second stent procedure.  On 10/19 the right right common femoral pseudoaneurysm smaller in diameter.  It had been present for a year.  It is s/p injection x 2 without adequate reversal.  No further intervention was indicated as it was asymptomatic. While hospitalized there was progression of her normochromic, normocytic anemia with H/H dropping from 11.4/35.3 to a final value of 9/28.6.  While hospitalized glucoses ranged 112 up to a high of 209; both were outliers.  The most recent A1c was 6.3% in June. She was discharged to [facility] for rehab. She returned to the ED 10/26 with lower abdominal pain associated with anorexia and 2 loose bowel movements.  CBC revealed improvement in the anemia with values of 10.1/31.7.  White count and differential were normal.  Lipase was normal with a value of 12, down from values of 22 during prior hospitalization.  CT angiogram of the abdomen pelvis with and without  contrast was performed in the ED.  Celiac and SMA artery stents were patent.  Right common femoral pseudoaneurysm was slightly smaller compared to prior exams.  Left groin pseudoaneurysm was not visualized and there was no evidence of active hemorrhage.  Aortic atherosclerosis was present.  Also bilateral severe renal artery stenosis was stable.  Initially tentatively transfer to [facility] for further evaluation was planned; but she was discharged back to the SNF based on subjective improvement in abdominal pain and the imaging results.  Past medical and surgical history: Includes diabetes with peripheral neuropathy; history of diverticulosis/ diverticulitis; dyslipidemia; & essential hypertension. Surgeries and procedures include abdominal hysterectomy; breast reduction surgery; left colectomy; EGD; TKA; and umbilical/ventral hernia repair laparoscopically.  She has had  transcatheter stent placements prior to the most recent interventions.  Family history: reviewed, non contributory due to advanced age.  Social history: Non-smoker; nondrinker.  She worked as a geologist, engineering in the outpatient setting for 30 years.   Review of systems: She is very intelligent and an excellent historian.  She states that the lower abdominal pain is better.  It is in the infra umbilical areas bilaterally and described as burning and nonradiating.  It is worse with OT/PT.  She has also had associated occasional burning in her chest.  Stool was dark which she attributes to drinking chocolate Ensure.  She describes numbness and tingling in her feet which is chronic.  She also has cold intolerance.  Constitutional: No fever, significant weight change, fatigue  Eyes: No redness, discharge, pain, vision change ENT/mouth: No nasal congestion, purulent discharge, earache, change in hearing, sore throat  Cardiovascular: No palpitations,  paroxysmal nocturnal dyspnea, claudication, edema  Respiratory: No cough, sputum production,  hemoptysis, DOE, significant snoring, apnea  Gastrointestinal: No heartburn, dysphagia,  nausea /vomiting, rectal bleeding, melena Genitourinary: No dysuria, hematuria, pyuria, incontinence, nocturia Musculoskeletal: No joint stiffness, joint swelling, weakness, pain Dermatologic: No rash, pruritus, change in appearance of skin Neurologic: No dizziness, headache, syncope, seizures Psychiatric: No significant anxiety, depression, insomnia, anorexia Endocrine: No change in hair/skin/nails, excessive thirst, excessive hunger, excessive urination  Hematologic/lymphatic: No significant bruising, lymphadenopathy, abnormal bleeding Allergy/immunology: No itchy/watery eyes, significant sneezing, urticaria, angioedema  Physical exam:  Pertinent or positive findings: She appears younger than her stated age.  Eyebrows are decreased in density.  There is slight ptosis on the left.  Dental hygiene is excellent.  She has a grade 1/2 systolic murmur at the left sternal border.  There is a well healed midline operative scar in the infraumbilical area.  Dorsalis pedis pulses are stronger than posterior tibial pulses.  Slight clubbing of the nailbeds is suggested.  She has a slightly erythematous papule above the left nare.  She states she was told it might be a basal cell cancer.  General appearance: Adequately nourished; no acute distress, increased work of breathing is present.   Lymphatic: No lymphadenopathy about the head, neck, axilla. Eyes: No conjunctival inflammation or lid edema is present. There is no scleral icterus. Ears:  External ear exam shows no significant lesions or deformities.   Nose:  External nasal examination shows no deformity or inflammation. Nasal mucosa are pink  Neck:  No thyromegaly, masses, tenderness noted.    Heart:  Normal rate and regular rhythm. S1 and S2 normal without gallop,  click, rub.  Lungs: Chest clear to auscultation without wheezes, rhonchi, rales, rubs. Abdomen:  Bowel sounds are normal.  Abdomen is soft and nontender with no organomegaly, hernias, masses. GU: Deferred  Extremities:  No cyanosis, edema. Neurologic exam: Balance, Rhomberg, finger to nose testing could not be completed due to clinical state Skin: Warm & dry w/o tenting. No significant rash.  See clinical summary under each active problem in the Problem List with associated updated therapeutic plan :  Acute blood loss anemia During the initial admission 10/13 - 04/22/2024 for mesenteric artery ischemia; H/H dropped from 11.4/35.3 down to 9.0/28.6.  Serially this is improving.  No bleeding dyscrasias reported.  Continue to monitor.  Essential hypertension Systolic blood pressure is slightly elevated but this is in the context of intermittent abdominal pain especially after OT/PT sessions.  Additionally this is in the context of renal artery stenosis.  Continued the vasodilating calcium channel blocker and escalate antihypertensive regimen if there is persistent significant systolic blood pressure elevation.  Mesenteric artery stenosis Follow-up with Dr. Jennefer as scheduled.  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  Current LDL was 91.  With the aortic atherosclerosis and mesenteric artery stenoses; LDL goal should be less than 70 at least.  Consideration could be given to alternate daily low-dose rosuvastatin.  This can be discussed with her PCP. While hospitalized glucoses ranged from 112 up to 209; both are outliers.  Most recent A1c in June of this year was 6.3% and indicating excellent control.  Papule of skin I recommended Dermatology referral by her PCP post rehab completion her @ SNF . Moh's surgery may be clinically indicated.

## 2024-04-27 NOTE — Assessment & Plan Note (Addendum)
 Follow-up with Dr. Jennefer as scheduled.

## 2024-04-27 NOTE — Assessment & Plan Note (Addendum)
 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  Serially this is improving.  No bleeding dyscrasias reported.  Continue to monitor.

## 2024-04-27 NOTE — Assessment & Plan Note (Signed)
 By history she has intolerance to statins described as severe myalgias.  Current LDL was 91.  With the aortic atherosclerosis and mesenteric artery stenoses; LDL goal should be less than 70 at least.  Consideration could be given to alternate daily low-dose rosuvastatin.  This can be discussed with her PCP. While hospitalized glucoses ranged from 112 up to 209; both are outliers.  Most recent A1c in June of this year was 6.3% and indicating excellent control.

## 2024-05-03 ENCOUNTER — Ambulatory Visit (INDEPENDENT_AMBULATORY_CARE_PROVIDER_SITE_OTHER)

## 2024-05-03 ENCOUNTER — Encounter (INDEPENDENT_AMBULATORY_CARE_PROVIDER_SITE_OTHER): Payer: Self-pay

## 2024-05-03 ENCOUNTER — Other Ambulatory Visit (HOSPITAL_COMMUNITY)
Admission: RE | Admit: 2024-05-03 | Discharge: 2024-05-03 | Disposition: A | Discharge status: Home / Self Care | Source: Ambulatory Visit

## 2024-05-03 ENCOUNTER — Telehealth (INDEPENDENT_AMBULATORY_CARE_PROVIDER_SITE_OTHER): Payer: Self-pay

## 2024-05-03 VITALS — BP 151/73 | HR 80 | Temp 98.0°F | Wt 171.0 lb

## 2024-05-03 DIAGNOSIS — C44311 Basal cell carcinoma of skin of nose: Secondary | ICD-10-CM | POA: Diagnosis not present

## 2024-05-03 DIAGNOSIS — L989 Disorder of the skin and subcutaneous tissue, unspecified: Secondary | ICD-10-CM

## 2024-05-03 MED ORDER — BACITRACIN 500 UNIT/GM EX OINT: 1.0000 | TOPICAL_OINTMENT | Freq: Two times a day (BID) | CUTANEOUS | 0 refills | Status: DC

## 2024-05-03 NOTE — Telephone Encounter (Signed)
 Pharmacy LVM regarding patient's prescription. Patient is not currently at their facility so the prescription needs to be sent to [facility] upon her son's request when I called him. Please resend prescription.

## 2024-05-03 NOTE — Progress Notes (Unsigned)
 HPI:   Discussed the use of AI scribe software for clinical note transcription with the patient, who gave verbal consent to proceed.  History of Present Illness [name] is a 89 year old female who presents with throat discomfort and a nasal lesion. She is accompanied by her daughter-in-law. She was referred for evaluation of throat discomfort.  She experiences throat discomfort, which she attributes to the dry heat from the radiator at her current residence. She uses cough drops occasionally, about one or two a day, to alleviate a 'throat tickle'. She drinks five to six 8-ounce glasses of water  daily, along with coffee, apple juice, orange juice, and two 8-ounce Ensures. No dry mouth, swallowing difficulties, or choking on food.  She has a nasal lesion that she noticed after being on oxygen during a COVID-19 infection in 2021. The lesion has been present for about four years and has grown slightly over time. It occasionally bleeds, especially when she washes her face or scratches it. She has no personal history of skin cancer, but her brother had skin cancer.     PMH/Meds/All/SocHx/FamHx/ROS: Past Medical History:  Diagnosis Date   Blood transfusion without reported diagnosis    Cognitive dysfunction 03/04/2019   Patient scores 21 out of 30 on [place] cognitive assessment September 2020   Diabetes mellitus without complication (HCC)    Diabetic peripheral neuropathy associated with type 2 diabetes mellitus (HCC) 02/25/2022   Diverticulitis    Frailty 03/04/2019   H/O bilateral breast reduction surgery    Hyperlipidemia    a. intolerant to statins.    Hypertension    Past Surgical History:  Procedure Laterality Date   ABDOMINAL HYSTERECTOMY  2003   APPENDECTOMY     age 89   BIOPSY  04/10/2023   Procedure: BIOPSY;  Surgeon: Cinderella Deatrice FALCON, MD;  Location: [facility];  Service: Endoscopy;;   BREAST REDUCTION SURGERY     age 25   COLON SURGERY Left 2011    Hemicolectomy due to diverticulitis   ESOPHAGOGASTRODUODENOSCOPY (EGD) WITH PROPOFOL  N/A 04/10/2023   Procedure: ESOPHAGOGASTRODUODENOSCOPY (EGD) WITH PROPOFOL ;  Surgeon: Cinderella Deatrice FALCON, MD;  Location: [facility];  Service: Endoscopy;  Laterality: N/A;   EYE SURGERY  03/30/2009   cataract   IR ANGIOGRAM SELECTIVE EACH ADDITIONAL VESSEL  04/15/2024   IR ANGIOGRAM VISCERAL SELECTIVE  04/11/2023   IR INTRAVASCULAR ULTRASOUND NON CORONARY  04/11/2023   IR RADIOLOGIST EVAL & MGMT  04/12/2024   IR TRANSCATH PLC STENT 1ST ART NOT LE CV CAR VERT CAR  04/11/2023   IR TRANSCATH PLC STENT 1ST ART NOT LE CV CAR VERT CAR  04/15/2024   IR US  GUIDE BX ASP/DRAIN  04/15/2024   IR US  GUIDE BX ASP/DRAIN  04/17/2024   IR US  GUIDE BX ASP/DRAIN  04/17/2024   IR US  GUIDE VASC ACCESS LEFT  04/15/2024   IR US  GUIDE VASC ACCESS RIGHT  04/11/2023   KNEE SURGERY Right 2003   knee cap   LAPAROSCOPIC INCISIONAL / UMBILICAL / VENTRAL HERNIA REPAIR  02/23/2007   REDUCTION MAMMAPLASTY Bilateral 2001   REFRACTIVE SURGERY     TOTAL KNEE ARTHROPLASTY Right 04/10/2022   Procedure: TOTAL KNEE ARTHROPLASTY;  Surgeon: Gerome Charleston, MD;  Location: [facility];  Service: Orthopedics;  Laterality: Right;  adductor canal   No family history of bleeding disorders, wound healing problems or difficulty with anesthesia.  Social Connections: Moderately Isolated (04/12/2024)   Social Connection and Isolation Panel    Frequency of  Communication with Friends and Family: More than three times a week    Frequency of Social Gatherings with Friends and Family: More than three times a week    Attends Religious Services: 1 to 4 times per year    Active Member of [employer] or Organizations: No    Attends Banker Meetings: Never    Marital Status: Widowed    Current Outpatient Medications:    acetaminophen  (TYLENOL ) 325 MG tablet, TAKE (2) TABLETS BY MOUTH EVERY SIX HOURS AS NEEDED FOR PAIN. MAX 3GM IN 24 HOURS., Disp: 120  tablet, Rfl: 5   albuterol  (PROVENTIL ) (2.5 MG/3ML) 0.083% nebulizer solution, Inhale 3 mLs (2.5 mg total) into the lungs every 6 (six) hours as needed for wheezing or shortness of breath., Disp: , Rfl:    ALPRAZolam  (XANAX ) 0.25 MG tablet, Take 1 tablet (0.25 mg total) by mouth at bedtime as needed for anxiety., Disp: 10 tablet, Rfl: 0   amLODipine  (NORVASC ) 10 MG tablet, TAKE (1) TABLET BY MOUTH ONCE DAILY., Disp: 30 tablet, Rfl: 5   aspirin  (ASPIRIN  LOW DOSE) 81 MG chewable tablet, CHEW (1) TABLET BY MOUTH ONCE DAILY., Disp: 30 tablet, Rfl: 12   bacitracin  500 UNIT/GM ointment, Apply 1 Application topically 2 (two) times daily for 7 days., Disp: 12.6 g, Rfl: 0   BETA CAROTENE PROVITAMIN A 25000 units capsule, TAKE (1) CAPSULE BY MOUTH AT BEDTIME., Disp: 30 capsule, Rfl: 0   cyanocobalamin  1000 MCG tablet, Take 1,000 mcg by mouth at bedtime., Disp: , Rfl:    cycloSPORINE  (RESTASIS ) 0.05 % ophthalmic emulsion, Place 1 drop into both eyes 2 (two) times daily., Disp: , Rfl:    estradiol  (ESTRACE ) 0.1 MG/GM vaginal cream, Discard plastic applicator. Insert a blueberry size amount (approximately 1 gram) of cream on fingertip inside vagina at bedtime every night for 1 week then every other night. For long term use., Disp: 30 g, Rfl: 3   feeding supplement (ENSURE PLUS HIGH PROTEIN) LIQD, Take 237 mLs by mouth 2 (two) times daily between meals., Disp: , Rfl:    gabapentin  (NEURONTIN ) 100 MG capsule, Take 1 capsule (100 mg total) by mouth daily., Disp: 30 capsule, Rfl: 0   glucose blood (EASYMAX TEST) test strip, CHECK BLOOD SUGAR ONCE DAILY.(CALL MD IF BS BELOW 60: OR IF BS ABOVE 400), Disp: 50 strip, Rfl: 3   hydrALAZINE  (APRESOLINE ) 25 MG tablet, Take 1 tablet (25 mg total) by mouth 3 (three) times daily., Disp: 90 tablet, Rfl: 5   IVIZIA DRY EYES 0.5 % SOLN, Place 1 drop into both eyes 2 (two) times daily., Disp: , Rfl:    lactobacillus (FLORANEX/LACTINEX) PACK, Take 1 packet (1 g total) by mouth 3  (three) times daily with meals., Disp: , Rfl:    lidocaine  (LIDODERM ) 5 %, Place 1 patch onto the skin daily. Remove & Discard patch within 12 hours or as directed by MD, Disp: , Rfl:    Melatonin 10 MG TABS, Take 10 mg by mouth at bedtime., Disp: 30 tablet, Rfl: 3   metFORMIN  (GLUCOPHAGE ) 500 MG tablet, TAKE (1) TABLET BY MOUTH IN THE MORNING & (1/2) TABLET (250MG ) BY MOUTH AT SUPPER, Disp: 45 tablet, Rfl: 5   methenamine  (HIPREX ) 1 g tablet, Take 1 tablet (1 g total) by mouth 2 (two) times daily with a meal. Most effective when taken with a daily Vitamin C supplement., Disp: 60 tablet, Rfl: 11   Multiple Vitamins-Minerals (PRESERVISION AREDS 2) CAPS, Take 1 capsule by mouth in the  morning and at bedtime., Disp: , Rfl:    mupirocin  ointment (BACTROBAN ) 2 %, Apply thin amount twice daily to rash in groin creases twice daily for 5 days at a time when rash occurs, Disp: 22 g, Rfl: 1   omeprazole (PRILOSEC) 40 MG capsule, Take 40 mg by mouth daily., Disp: , Rfl:    ondansetron  (ZOFRAN ) 4 MG tablet, Take 1 tablet (4 mg total) by mouth every 6 (six) hours as needed for nausea or vomiting., Disp: , Rfl:    polyethylene glycol (MIRALAX  / GLYCOLAX ) 17 g packet, Take 17 g by mouth daily as needed for moderate constipation., Disp: , Rfl:    Safety Lancets 28G MISC, CHECK BLOOD SUGAR ONCE DAILY., Disp: 100 each, Rfl: 0   tamsulosin  (FLOMAX ) 0.4 MG CAPS capsule, Take 1 capsule (0.4 mg total) by mouth daily., Disp: 30 capsule, Rfl: 2   Vitamin D , Cholecalciferol , 10 MCG (400 UNIT) TABS, TAKE (1) TABLET BY MOUTH ONCE DAILY., Disp: 32 tablet, Rfl: 5   Vitamins A & D (VITAMIN A & D) ointment, Apply topically as needed for dry skin (Leg)., Disp: , Rfl:    ketoconazole  (NIZORAL ) 2 % cream, Apply 1 Application topically 2 (two) times daily. For 10 days to groin rash (Patient not taking: Reported on 05/03/2024), Disp: 60 g, Rfl: 4   pantoprazole  (PROTONIX ) 40 MG tablet, Take 1 tablet (40 mg total) by mouth 2 (two) times  daily before a meal. (Patient not taking: Reported on 05/03/2024), Disp: 60 tablet, Rfl: 3 A complete ROS was performed with pertinent positives/negatives noted in the HPI. The remainder of the ROS are negative.   Physical Exam:  BP (!) 151/73 (BP Location: Left Arm, Patient Position: Sitting, Cuff Size: Normal) Comment: pt having white coat  Pulse 80   Temp 98 F (36.7 C)   Wt 171 lb (77.6 kg)   SpO2 98%   BMI 26.00 kg/m  General: Well developed, well nourished. No acute distress. Voice *** Head/Face: Normocephalic. No sinus tenderness. Facial nerve intact and equal bilaterally. ***No facial lacerations. Eyes: PERRL, no scleral icterus or conjunctival hemorrhage. EOMI. Ears: No gross deformity. Normal external canal. Tympanic membrane *** bilaterally Hearing: Normal speech reception. *** Nose: No gross deformity or lesions. No purulent discharge. No turbinate hypertrophy. Mouth/Oropharynx: Lips without any lesions. Dentition ***. No mucosal lesions within the oropharynx. No tonsillar enlargement, exudate, or lesions. Pharyngeal walls symmetrical. Uvula midline. Tongue midline without lesions. Larynx: See TFL if applicable Nasopharynx: See TFL if applicable Neck: Trachea midline. No masses. No thyromegaly or nodules palpated. No crepitus. Lymphatic: No lymphadenopathy in the neck. Respiratory: No stridor or distress. Room air. Cardiovascular: Regular rate and rhythm. Extremities: No edema or cyanosis. Warm and well-perfused. Skin: No scars or lesions on face or neck. Neurologic: CN II-XII grossly intact. Moving all extremities without gross abnormality. Other:  Independent Review of Additional Tests or Records: None*** Procedures: None*** Impression & Plans:   Assessment and Plan Assessment & Plan Nasal lesion suspicious for basal cell carcinoma Lesion present for four years, growth and occasional bleeding noted. Appearance suspicious for basal cell carcinoma. - Perform punch  biopsy of nasal lesion. - Send biopsy sample for pathology to determine diagnosis and margins. - If biopsy confirms basal cell carcinoma, plan for excision with appropriate margins.  Throat discomfort Intermittent throat tickling, possibly due to dry heat exposure. Adequate hydration reported. - Continue sugar-free lemon cough drops as needed. - Consider Biotene lozenges if dryness or discomfort increases.   Follow-up  in 1 week.   Adah Malkin, DO [facility]

## 2024-05-04 ENCOUNTER — Telehealth (INDEPENDENT_AMBULATORY_CARE_PROVIDER_SITE_OTHER): Payer: Self-pay

## 2024-05-04 ENCOUNTER — Other Ambulatory Visit (INDEPENDENT_AMBULATORY_CARE_PROVIDER_SITE_OTHER): Payer: Self-pay

## 2024-05-04 LAB — SURGICAL PATHOLOGY

## 2024-05-04 NOTE — Telephone Encounter (Signed)
 Kyra from [facility] called and stated that the patient seen yesterday by Dr. Mila is currently in the [facility].  The Rx for ointment for the patient's nose was called in to [facility] and should have been sent to the Pharmacy [facility] uses.  Please send a new Rx.  If you have any questions, please call Kyra at [phone].

## 2024-05-05 ENCOUNTER — Encounter: Payer: Self-pay | Admitting: Vascular Surgery

## 2024-05-05 ENCOUNTER — Non-Acute Institutional Stay (SKILLED_NURSING_FACILITY): Payer: Self-pay

## 2024-05-05 ENCOUNTER — Ambulatory Visit: Attending: Vascular Surgery | Admitting: Vascular Surgery

## 2024-05-05 ENCOUNTER — Other Ambulatory Visit: Payer: Self-pay | Admitting: Adult Health

## 2024-05-05 VITALS — BP 149/75 | HR 80 | Temp 97.7°F

## 2024-05-05 DIAGNOSIS — Z8619 Personal history of other infectious and parasitic diseases: Secondary | ICD-10-CM

## 2024-05-05 DIAGNOSIS — Y842 Radiological procedure and radiotherapy as the cause of abnormal reaction of the patient, or of later complication, without mention of misadventure at the time of the procedure: Secondary | ICD-10-CM | POA: Diagnosis not present

## 2024-05-05 DIAGNOSIS — K559 Vascular disorder of intestine, unspecified: Secondary | ICD-10-CM | POA: Diagnosis not present

## 2024-05-05 DIAGNOSIS — K551 Chronic vascular disorders of intestine: Secondary | ICD-10-CM

## 2024-05-05 DIAGNOSIS — R350 Frequency of micturition: Secondary | ICD-10-CM

## 2024-05-05 DIAGNOSIS — Z1624 Resistance to multiple antibiotics: Secondary | ICD-10-CM

## 2024-05-05 DIAGNOSIS — I10 Essential (primary) hypertension: Secondary | ICD-10-CM

## 2024-05-05 DIAGNOSIS — I724 Aneurysm of artery of lower extremity: Secondary | ICD-10-CM | POA: Diagnosis not present

## 2024-05-05 DIAGNOSIS — Z09 Encounter for follow-up examination after completed treatment for conditions other than malignant neoplasm: Secondary | ICD-10-CM

## 2024-05-05 DIAGNOSIS — N3000 Acute cystitis without hematuria: Secondary | ICD-10-CM

## 2024-05-05 DIAGNOSIS — I700 Atherosclerosis of aorta: Secondary | ICD-10-CM

## 2024-05-05 DIAGNOSIS — N390 Urinary tract infection, site not specified: Secondary | ICD-10-CM

## 2024-05-05 DIAGNOSIS — E119 Type 2 diabetes mellitus without complications: Secondary | ICD-10-CM

## 2024-05-05 DIAGNOSIS — R339 Retention of urine, unspecified: Secondary | ICD-10-CM

## 2024-05-05 DIAGNOSIS — E871 Hypo-osmolality and hyponatremia: Secondary | ICD-10-CM

## 2024-05-05 MED ORDER — OMEPRAZOLE 40 MG PO CPDR: 40.0000 mg | DELAYED_RELEASE_CAPSULE | Freq: Every day | ORAL | 0 refills | Status: DC

## 2024-05-05 MED ORDER — HYDRALAZINE HCL 25 MG PO TABS: 25.0000 mg | ORAL_TABLET | Freq: Three times a day (TID) | ORAL | 0 refills | Status: DC

## 2024-05-05 MED ORDER — LIDOCAINE 5 % EX PTCH: 1.0000 | MEDICATED_PATCH | CUTANEOUS | 0 refills | Status: DC

## 2024-05-05 MED ORDER — ONDANSETRON HCL 4 MG PO TABS: 4.0000 mg | ORAL_TABLET | Freq: Four times a day (QID) | ORAL | 0 refills | Status: DC | PRN

## 2024-05-05 MED ORDER — METFORMIN HCL 500 MG PO TABS: ORAL_TABLET | ORAL | 0 refills | Status: DC

## 2024-05-05 MED ORDER — ALPRAZOLAM 0.25 MG PO TABS: 0.2500 mg | ORAL_TABLET | Freq: Every evening | ORAL | 0 refills | Status: DC | PRN

## 2024-05-05 MED ORDER — CYCLOSPORINE 0.05 % OP EMUL: 1.0000 [drp] | Freq: Two times a day (BID) | OPHTHALMIC | 0 refills | Status: AC

## 2024-05-05 MED ORDER — TAMSULOSIN HCL 0.4 MG PO CAPS
0.4000 mg | ORAL_CAPSULE | Freq: Every day | ORAL | 0 refills | Status: DC
Start: 2024-05-05 — End: 2024-05-14

## 2024-05-05 MED ORDER — GABAPENTIN 100 MG PO CAPS: 100.0000 mg | ORAL_CAPSULE | Freq: Every day | ORAL | 0 refills | Status: DC

## 2024-05-05 MED ORDER — AMLODIPINE BESYLATE 10 MG PO TABS: 10.0000 mg | ORAL_TABLET | Freq: Every day | ORAL | 0 refills | Status: DC

## 2024-05-05 MED ORDER — METHENAMINE HIPPURATE 1 G PO TABS: 1.0000 g | ORAL_TABLET | Freq: Two times a day (BID) | ORAL | 0 refills | Status: AC

## 2024-05-05 NOTE — Progress Notes (Signed)
 Location:  [name] Room Number: 136 P Place of Service:  SNF (31)   CODE STATUS: DNR  Allergies  Allergen Reactions   Statins Other (See Comments)    Severe myalgias   Phenergan [Promethazine Hcl] Other (See Comments)    Hallucinations    Hydrocortisone Itching   Lisinopril Other (See Comments)    Lethargy, Fatigue   Prednisone  Rash   Trazodone  And Nefazodone Cough    Chief Complaint  Patient presents with   Discharge Note    HPI:  She is being discharged to home with pt/ot three times weekly. She will not need any dme. She will need her prescriptions written and will need to follow up with her medical provider. She was hospitalized Acute mesenteric ischemia: Is status post superior mesenteric artery stent on 04-12-24 iva right CFA and celiac artery stent done on 04-15-24 via left CFA access. Both procedures were complicated by right and left femoral artery pseudoaneurysms. The right pseudoaneurysm was injected with thrombin on 04-15-24 due the celiac artery stent placement. On 10-19-125 IR: right still open but even smaller. She was admitted to this facility for short term rehab: therapy: she is able to ambulate 550 feet with rolling walker; independent with her adl care; brp independent.   Past Medical History:  Diagnosis Date   Blood transfusion without reported diagnosis    Cognitive dysfunction 03/04/2019   Patient scores 21 out of 30 on [place] cognitive assessment September 2020   Diabetes mellitus without complication (HCC)    Diabetic peripheral neuropathy associated with type 2 diabetes mellitus (HCC) 02/25/2022   Diverticulitis    Frailty 03/04/2019   H/O bilateral breast reduction surgery    Hyperlipidemia    a. intolerant to statins.    Hypertension     Past Surgical History:  Procedure Laterality Date   ABDOMINAL HYSTERECTOMY  2003   APPENDECTOMY     age 40   BIOPSY  04/10/2023   Procedure: BIOPSY;  Surgeon: Cinderella Deatrice FALCON, MD;  [facility]: [facility];  Service: Endoscopy;;   BREAST REDUCTION SURGERY     age 60   COLON SURGERY Left 2011   Hemicolectomy due to diverticulitis   ESOPHAGOGASTRODUODENOSCOPY (EGD) WITH PROPOFOL  N/A 04/10/2023   Procedure: ESOPHAGOGASTRODUODENOSCOPY (EGD) WITH PROPOFOL ;  Surgeon: Cinderella Deatrice FALCON, MD;  [facility]: [facility];  Service: Endoscopy;  Laterality: N/A;   EYE SURGERY  03/30/2009   cataract   IR ANGIOGRAM SELECTIVE EACH ADDITIONAL VESSEL  04/15/2024   IR ANGIOGRAM VISCERAL SELECTIVE  04/11/2023   IR INTRAVASCULAR ULTRASOUND NON CORONARY  04/11/2023   IR RADIOLOGIST EVAL & MGMT  04/12/2024   IR TRANSCATH PLC STENT 1ST ART NOT LE CV CAR VERT CAR  04/11/2023   IR TRANSCATH PLC STENT 1ST ART NOT LE CV CAR VERT CAR  04/15/2024   IR US  GUIDE BX ASP/DRAIN  04/15/2024   IR US  GUIDE BX ASP/DRAIN  04/17/2024   IR US  GUIDE BX ASP/DRAIN  04/17/2024   IR US  GUIDE VASC ACCESS LEFT  04/15/2024   IR US  GUIDE VASC ACCESS RIGHT  04/11/2023   KNEE SURGERY Right 2003   knee cap   LAPAROSCOPIC INCISIONAL / UMBILICAL / VENTRAL HERNIA REPAIR  02/23/2007   REDUCTION MAMMAPLASTY Bilateral 2001   REFRACTIVE SURGERY     TOTAL KNEE ARTHROPLASTY Right 04/10/2022   Procedure: TOTAL KNEE ARTHROPLASTY;  Surgeon: Gerome Charleston, MD;  Location: [facility];  Service: Orthopedics;  Laterality: Right;  adductor canal  Social History   Socioeconomic History   Marital status: Widowed    Spouse name: Not on file   Number of children: Not on file   Years of education: Not on file   Highest education level: Not on file  Occupational History   Not on file  Tobacco Use   Smoking status: Never   Smokeless tobacco: Never  Vaping Use   Vaping status: Never Used  Substance and Sexual Activity   Alcohol  use: No   Drug use: No   Sexual activity: Not Currently    Birth control/protection: Abstinence  Other Topics Concern   Not on file  Social History Narrative   Not on file   Social Drivers  of Health   Financial Resource Strain: Low Risk  (09/06/2022)   Overall Financial Resource Strain (CARDIA)    Difficulty of Paying Living Expenses: Not hard at all  Food Insecurity: No Food Insecurity (04/12/2024)   Hunger Vital Sign    Worried About Running Out of Food in the Last Year: Never true    Ran Out of Food in the Last Year: Never true  Transportation Needs: No Transportation Needs (04/22/2024)   PRAPARE - Administrator, Civil Service (Medical): No    Lack of Transportation (Non-Medical): No  Physical Activity: Sufficiently Active (09/06/2022)   Exercise Vital Sign    Days of Exercise per Week: 5 days    Minutes of Exercise per Session: 30 min  Stress: No Stress Concern Present (09/06/2022)   [employer] of Occupational Health - Occupational Stress Questionnaire    Feeling of Stress : Not at all  Social Connections: Moderately Isolated (04/12/2024)   Social Connection and Isolation Panel    Frequency of Communication with Friends and Family: More than three times a week    Frequency of Social Gatherings with Friends and Family: More than three times a week    Attends Religious Services: 1 to 4 times per year    Active Member of [employer] or Organizations: No    Attends Banker Meetings: Never    Marital Status: Widowed  Intimate Partner Violence: Not At Risk (04/12/2024)   Humiliation, Afraid, Rape, and Kick questionnaire    Fear of Current or Ex-Partner: No    Emotionally Abused: No    Physically Abused: No    Sexually Abused: No   Family History  Problem Relation Age of Onset   Cancer Mother        ovaian   Hypertension Father        kidney   Stroke Father    Cancer Father    Hypertension Sister    Diabetes Brother       VITAL SIGNS BP (!) 148/51   Pulse 61   Temp 97.8 F (36.6 C)   Ht 5' 8 (1.727 m)   Wt 170 lb 3.2 oz (77.2 kg)   SpO2 100%   BMI 25.88 kg/m   Outpatient Encounter Medications as of 05/05/2024  Medication Sig    acetaminophen  (TYLENOL ) 325 MG tablet TAKE (2) TABLETS BY MOUTH EVERY SIX HOURS AS NEEDED FOR PAIN. MAX 3GM IN 24 HOURS.   albuterol  (PROVENTIL ) (2.5 MG/3ML) 0.083% nebulizer solution Inhale 3 mLs (2.5 mg total) into the lungs every 6 (six) hours as needed for wheezing or shortness of breath.   ALPRAZolam  (XANAX ) 0.25 MG tablet Take 1 tablet (0.25 mg total) by mouth at bedtime as needed for anxiety.   amLODipine  (NORVASC ) 10 MG tablet  TAKE (1) TABLET BY MOUTH ONCE DAILY.   aspirin  (ASPIRIN  LOW DOSE) 81 MG chewable tablet CHEW (1) TABLET BY MOUTH ONCE DAILY.   Balsam [place]-Castor Oil (VENELEX) OINT Apply topically as directed. Special Instructions: Apply to bilateral buttocks, sacrum and coccyx qshift. Every Shift Day, Evening, Night   BETA CAROTENE PROVITAMIN A 25000 units capsule TAKE (1) CAPSULE BY MOUTH AT BEDTIME.   cyanocobalamin  1000 MCG tablet Take 1,000 mcg by mouth at bedtime.   cycloSPORINE  (RESTASIS ) 0.05 % ophthalmic emulsion Place 1 drop into both eyes 2 (two) times daily.   estradiol  (ESTRACE ) 0.1 MG/GM vaginal cream Discard plastic applicator. Insert a blueberry size amount (approximately 1 gram) of cream on fingertip inside vagina at bedtime every night for 1 week then every other night. For long term use.   gabapentin  (NEURONTIN ) 100 MG capsule Take 1 capsule (100 mg total) by mouth daily.   hydrALAZINE  (APRESOLINE ) 25 MG tablet Take 1 tablet (25 mg total) by mouth 3 (three) times daily.   IVIZIA DRY EYES 0.5 % SOLN Place 1 drop into both eyes 2 (two) times daily.   lactobacillus (FLORANEX/LACTINEX) PACK Take 1 packet (1 g total) by mouth 3 (three) times daily with meals.   lidocaine  (LIDODERM ) 5 % Place 1 patch onto the skin daily. Remove & Discard patch within 12 hours or as directed by MD   Melatonin 10 MG TABS Take 10 mg by mouth at bedtime.   Menthol , Topical Analgesic, (BIOFREEZE COOL THE PAIN) 4 % GEL Apply 1 Application topically 3 (three) times daily. 1 application;  topical Special Instructions: apply to back of neck three times daily due to pain. Three Times A Day 09:00 AM, 02:00 PM, 09:00 PM   metFORMIN  (GLUCOPHAGE ) 500 MG tablet TAKE (1) TABLET BY MOUTH IN THE MORNING & (1/2) TABLET (250MG ) BY MOUTH AT SUPPER   methenamine  (HIPREX ) 1 g tablet Take 1 tablet (1 g total) by mouth 2 (two) times daily with a meal. Most effective when taken with a daily Vitamin C supplement.   Multiple Vitamins-Minerals (PRESERVISION AREDS 2) CAPS Take 1 capsule by mouth in the morning and at bedtime.   omeprazole (PRILOSEC) 40 MG capsule Take 40 mg by mouth daily.   ondansetron  (ZOFRAN ) 4 MG tablet Take 1 tablet (4 mg total) by mouth every 6 (six) hours as needed for nausea or vomiting.   pantoprazole  (PROTONIX ) 40 MG tablet Take 1 tablet (40 mg total) by mouth 2 (two) times daily before a meal.   polyethylene glycol (MIRALAX  / GLYCOLAX ) 17 g packet Take 17 g by mouth daily as needed for moderate constipation.   Safety Lancets 28G MISC CHECK BLOOD SUGAR ONCE DAILY.   tamsulosin  (FLOMAX ) 0.4 MG CAPS capsule Take 1 capsule (0.4 mg total) by mouth daily.   vitamin D3 (CHOLECALCIFEROL ) 25 MCG tablet Take 1,000 Units by mouth daily.   bacitracin  500 UNIT/GM ointment Apply 1 Application topically 2 (two) times daily for 7 days.   feeding supplement (ENSURE PLUS HIGH PROTEIN) LIQD Take 237 mLs by mouth 2 (two) times daily between meals.   glucose blood (EASYMAX TEST) test strip CHECK BLOOD SUGAR ONCE DAILY.(CALL MD IF BS BELOW 60: OR IF BS ABOVE 400)   ketoconazole  (NIZORAL ) 2 % cream Apply 1 Application topically 2 (two) times daily. For 10 days to groin rash (Patient not taking: Reported on 05/05/2024)   mupirocin  ointment (BACTROBAN ) 2 % Apply thin amount twice daily to rash in groin creases twice daily for 5 days  at a time when rash occurs (Patient not taking: Reported on 05/05/2024)   Vitamin D , Cholecalciferol , 10 MCG (400 UNIT) TABS TAKE (1) TABLET BY MOUTH ONCE DAILY. (Patient  not taking: Reported on 05/05/2024)   Vitamins A & D (VITAMIN A & D) ointment Apply topically as needed for dry skin (Leg). (Patient not taking: Reported on 05/05/2024)   No facility-administered encounter medications on file as of 05/05/2024.     SIGNIFICANT DIAGNOSTIC EXAMS  TODAY  04-12-24: Wbc 7.1; hgb 11.4; hct 35.3; mcv 85.9 plt 322; glucose 133; bun 15; creat 1.02; k+ 3.7; na++ 138; ca 9.4; gfr 52; protein 6.6 albumin 3.8 04-15-24: wbc 5.2; hgb 10.7; hct 33.7; mcv 86.4 plt 254; glucose 116; bun 18; creat 1.00; k+ 3.9; na++ 134; ca 8.8; gfr 54 04-21-24: wbc 8.2; hgb 9.0; hct 28.6; mcv 87.7 plt 240; glucose 160; bun 25; creat 0.92; k+ 4.4; na++ 136; ca 9.0; gfr 59   Review of Systems  Constitutional:  Negative for malaise/fatigue.  Respiratory:  Negative for cough and shortness of breath.   Cardiovascular:  Negative for chest pain, palpitations and leg swelling.  Gastrointestinal:  Negative for abdominal pain, constipation and heartburn.  Musculoskeletal:  Negative for back pain, joint pain and myalgias.  Skin: Negative.   Neurological:  Negative for dizziness.  Psychiatric/Behavioral:  The patient is not nervous/anxious.     Physical Exam Constitutional:      General: She is not in acute distress.    Appearance: She is well-developed. She is not diaphoretic.  Neck:     Thyroid : No thyromegaly.  Cardiovascular:     Rate and Rhythm: Normal rate and regular rhythm.     Heart sounds: Normal heart sounds.  Pulmonary:     Effort: Pulmonary effort is normal. No respiratory distress.     Breath sounds: Normal breath sounds.  Abdominal:     General: Bowel sounds are normal. There is no distension.     Palpations: Abdomen is soft.     Tenderness: There is no abdominal tenderness.  Musculoskeletal:        General: Normal range of motion.     Cervical back: Neck supple.     Right lower leg: No edema.     Left lower leg: No edema.  Lymphadenopathy:     Cervical: No cervical  adenopathy.  Skin:    General: Skin is warm and dry.  Neurological:     Mental Status: She is alert and oriented to person, place, and time.     Comments: 04-18-23: bcat 44/50     Psychiatric:        Mood and Affect: Mood normal.       ASSESSMENT/ PLAN:   Patient is being discharged with the following services:  pt/ot three times weekly to evaluate and treat as indicated for gait balance strength adl training.   Patient is being discharged with the following durable medical equipment:    Patient has been advised to f/u with their PCP in 1-2 weeks to for a transitions of care visit.  Social services at their facility was responsible for arranging this appointment.  Pt was provided with adequate prescriptions of noncontrolled medications to reach the scheduled appointment .  For controlled substances, a limited supply was provided as appropriate for the individual patient.  If the pt normally receives these medications from a pain clinic or has a contract with another physician, these medications should be received from that clinic or physician only).  A 30 day supply of her prescription medications have been sent to [facility].   Time spent with patient: 40 minutes: medications; dme; home health.   Barnie Seip NP [facility]   call [phone]

## 2024-05-05 NOTE — Progress Notes (Signed)
 Patient ID: [name], female   DOB: [birth year], 89 y.o.   MRN: [id number]  Reason for Consult: New Patient (Initial Visit)   Referred by [name], MD  Subjective:     HPI:  [name] is a 89 y.o. female has undergone initially SMA stenting followed by celiac artery stenting complicated by bilateral common femoral artery pseudoaneurysms.  No longer feels abdominal pain has some mild recent weight loss but eating without limitation.  She does have some pain down the left medial thigh with bruising that is her only complaint today.  Past Medical History:  Diagnosis Date   Blood transfusion without reported diagnosis    Cognitive dysfunction 03/04/2019   Patient scores 21 out of 30 on [place] cognitive assessment September 2020   Diabetes mellitus without complication (HCC)    Diabetic peripheral neuropathy associated with type 2 diabetes mellitus (HCC) 02/25/2022   Diverticulitis    Frailty 03/04/2019   H/O bilateral breast reduction surgery    Hyperlipidemia    a. intolerant to statins.    Hypertension    Family History  Problem Relation Age of Onset   Cancer Mother        ovaian   Hypertension Father        kidney   Stroke Father    Cancer Father    Hypertension Sister    Diabetes Brother    Past Surgical History:  Procedure Laterality Date   ABDOMINAL HYSTERECTOMY  2003   APPENDECTOMY     age 5   BIOPSY  04/10/2023   Procedure: BIOPSY;  Surgeon: Cinderella Deatrice FALCON, MD;  [facility];  Service: Endoscopy;;   BREAST REDUCTION SURGERY     age 25   COLON SURGERY Left 2011   Hemicolectomy due to diverticulitis   ESOPHAGOGASTRODUODENOSCOPY (EGD) WITH PROPOFOL  N/A 04/10/2023   Procedure: ESOPHAGOGASTRODUODENOSCOPY (EGD) WITH PROPOFOL ;  Surgeon: Cinderella Deatrice FALCON, MD;  [facility];  Service: Endoscopy;  Laterality: N/A;   EYE SURGERY  03/30/2009   cataract   IR ANGIOGRAM SELECTIVE EACH ADDITIONAL VESSEL  04/15/2024   IR  ANGIOGRAM VISCERAL SELECTIVE  04/11/2023   IR INTRAVASCULAR ULTRASOUND NON CORONARY  04/11/2023   IR RADIOLOGIST EVAL & MGMT  04/12/2024   IR TRANSCATH PLC STENT 1ST ART NOT LE CV CAR VERT CAR  04/11/2023   IR TRANSCATH PLC STENT 1ST ART NOT LE CV CAR VERT CAR  04/15/2024   IR US  GUIDE BX ASP/DRAIN  04/15/2024   IR US  GUIDE BX ASP/DRAIN  04/17/2024   IR US  GUIDE BX ASP/DRAIN  04/17/2024   IR US  GUIDE VASC ACCESS LEFT  04/15/2024   IR US  GUIDE VASC ACCESS RIGHT  04/11/2023   KNEE SURGERY Right 2003   knee cap   LAPAROSCOPIC INCISIONAL / UMBILICAL / VENTRAL HERNIA REPAIR  02/23/2007   REDUCTION MAMMAPLASTY Bilateral 2001   REFRACTIVE SURGERY     TOTAL KNEE ARTHROPLASTY Right 04/10/2022   Procedure: TOTAL KNEE ARTHROPLASTY;  Surgeon: Gerome Charleston, MD;  Location: [facility];  Service: Orthopedics;  Laterality: Right;  adductor canal    Short Social History:  Social History   Tobacco Use   Smoking status: Never   Smokeless tobacco: Never  Substance Use Topics   Alcohol  use: No    Allergies  Allergen Reactions   Statins Other (See Comments)    Severe myalgias   Phenergan [Promethazine Hcl] Other (See Comments)    Hallucinations    Hydrocortisone Itching  Lisinopril Other (See Comments)    Lethargy, Fatigue   Prednisone  Rash   Trazodone  And Nefazodone Cough    Current Outpatient Medications  Medication Sig Dispense Refill   acetaminophen  (TYLENOL ) 325 MG tablet TAKE (2) TABLETS BY MOUTH EVERY SIX HOURS AS NEEDED FOR PAIN. MAX 3GM IN 24 HOURS. 120 tablet 5   albuterol  (PROVENTIL ) (2.5 MG/3ML) 0.083% nebulizer solution Inhale 3 mLs (2.5 mg total) into the lungs every 6 (six) hours as needed for wheezing or shortness of breath.     ALPRAZolam  (XANAX ) 0.25 MG tablet Take 1 tablet (0.25 mg total) by mouth at bedtime as needed for anxiety. 10 tablet 0   amLODipine  (NORVASC ) 10 MG tablet TAKE (1) TABLET BY MOUTH ONCE DAILY. 30 tablet 5   aspirin  (ASPIRIN  LOW DOSE) 81 MG chewable  tablet CHEW (1) TABLET BY MOUTH ONCE DAILY. 30 tablet 12   bacitracin  500 UNIT/GM ointment Apply 1 Application topically 2 (two) times daily for 7 days. 12.6 g 0   BETA CAROTENE PROVITAMIN A 25000 units capsule TAKE (1) CAPSULE BY MOUTH AT BEDTIME. 30 capsule 0   cyanocobalamin  1000 MCG tablet Take 1,000 mcg by mouth at bedtime.     cycloSPORINE  (RESTASIS ) 0.05 % ophthalmic emulsion Place 1 drop into both eyes 2 (two) times daily.     estradiol  (ESTRACE ) 0.1 MG/GM vaginal cream Discard plastic applicator. Insert a blueberry size amount (approximately 1 gram) of cream on fingertip inside vagina at bedtime every night for 1 week then every other night. For long term use. 30 g 3   feeding supplement (ENSURE PLUS HIGH PROTEIN) LIQD Take 237 mLs by mouth 2 (two) times daily between meals.     gabapentin  (NEURONTIN ) 100 MG capsule Take 1 capsule (100 mg total) by mouth daily. 30 capsule 0   glucose blood (EASYMAX TEST) test strip CHECK BLOOD SUGAR ONCE DAILY.(CALL MD IF BS BELOW 60: OR IF BS ABOVE 400) 50 strip 3   hydrALAZINE  (APRESOLINE ) 25 MG tablet Take 1 tablet (25 mg total) by mouth 3 (three) times daily. 90 tablet 5   IVIZIA DRY EYES 0.5 % SOLN Place 1 drop into both eyes 2 (two) times daily.     ketoconazole  (NIZORAL ) 2 % cream Apply 1 Application topically 2 (two) times daily. For 10 days to groin rash (Patient not taking: Reported on 05/03/2024) 60 g 4   lactobacillus (FLORANEX/LACTINEX) PACK Take 1 packet (1 g total) by mouth 3 (three) times daily with meals.     lidocaine  (LIDODERM ) 5 % Place 1 patch onto the skin daily. Remove & Discard patch within 12 hours or as directed by MD     Melatonin 10 MG TABS Take 10 mg by mouth at bedtime. 30 tablet 3   Menthol , Topical Analgesic, (BIOFREEZE COOL THE PAIN) 4 % GEL Apply 1 Application topically 3 (three) times daily. 1 application; topical Special Instructions: apply to back of neck three times daily due to pain. Three Times A Day 09:00 AM, 02:00  PM, 09:00 PM     metFORMIN  (GLUCOPHAGE ) 500 MG tablet TAKE (1) TABLET BY MOUTH IN THE MORNING & (1/2) TABLET (250MG ) BY MOUTH AT SUPPER 45 tablet 5   methenamine  (HIPREX ) 1 g tablet Take 1 tablet (1 g total) by mouth 2 (two) times daily with a meal. Most effective when taken with a daily Vitamin C supplement. 60 tablet 11   Multiple Vitamins-Minerals (PRESERVISION AREDS 2) CAPS Take 1 capsule by mouth in the morning and at bedtime.  mupirocin  ointment (BACTROBAN ) 2 % Apply thin amount twice daily to rash in groin creases twice daily for 5 days at a time when rash occurs 22 g 1   omeprazole (PRILOSEC) 40 MG capsule Take 40 mg by mouth daily.     ondansetron  (ZOFRAN ) 4 MG tablet Take 1 tablet (4 mg total) by mouth every 6 (six) hours as needed for nausea or vomiting.     pantoprazole  (PROTONIX ) 40 MG tablet Take 1 tablet (40 mg total) by mouth 2 (two) times daily before a meal. (Patient not taking: Reported on 05/03/2024) 60 tablet 3   polyethylene glycol (MIRALAX  / GLYCOLAX ) 17 g packet Take 17 g by mouth daily as needed for moderate constipation.     Safety Lancets 28G MISC CHECK BLOOD SUGAR ONCE DAILY. 100 each 0   tamsulosin  (FLOMAX ) 0.4 MG CAPS capsule Take 1 capsule (0.4 mg total) by mouth daily. 30 capsule 2   Vitamin D , Cholecalciferol , 10 MCG (400 UNIT) TABS TAKE (1) TABLET BY MOUTH ONCE DAILY. 32 tablet 5   Vitamins A & D (VITAMIN A & D) ointment Apply topically as needed for dry skin (Leg).     No current facility-administered medications for this visit.    Review of Systems  Constitutional:  Constitutional negative. HENT: HENT negative.  Eyes: Eyes negative.  Cardiovascular: Cardiovascular negative.  GI: Gastrointestinal negative.  Musculoskeletal:       Bruising left thigh Skin: Skin negative.  Neurological: Neurological negative. Hematologic: Positive for bruises/bleeds easily.  Psychiatric: Psychiatric negative.        Objective:  Objective   Vitals:   05/05/24 1035   BP: (!) 149/75  Pulse: 80  Temp: 97.7 F (36.5 C)  SpO2: 96%   There is no height or weight on file to calculate BMI.  Physical Exam HENT:     Head: Normocephalic.     Nose: Nose normal.     Mouth/Throat:     Mouth: Mucous membranes are moist.  Eyes:     Pupils: Pupils are equal, round, and reactive to light.  Cardiovascular:     Rate and Rhythm: Normal rate.     Pulses:          Femoral pulses are 2+ on the right side and 2+ on the left side. Pulmonary:     Effort: Pulmonary effort is normal.  Abdominal:     General: Abdomen is flat.     Palpations: Abdomen is soft. There is no mass.  Musculoskeletal:     Comments: Minimal bruising down medial left thigh  Skin:    Capillary Refill: Capillary refill takes less than 2 seconds.  Neurological:     General: No focal deficit present.     Mental Status: She is alert.  Psychiatric:        Mood and Affect: Mood normal.     Data: CT IMPRESSION: VASCULAR   1. Celiac and SMA artery stents are patent. 2. Right common femoral pseudoaneurysm, slightly smaller as compared with prior exam. 3. Left groin pseudoaneurysm is not seen in there is no evidence of active hemorrhage at this time. 4. Aortic atherosclerosis. 5. Stable bilateral severe renal artery stenosis.   NON-VASCULAR   1. No evidence of mesenteric ischemia or other acute process. 2. Stable left groin hematoma with no definite evidence of contrast extravasation or pseudoaneurysm at this time. 3. Diverticulosis without diverticulitis. 4. Cholelithiasis.   Exam: Bilateral groins  Indications: Bilateral pseudoaneurysms status post celiac and mesenteric  stenting. Right  thrombin injection x 2 and left thrombin injection x 1 by  IR.  Comparison Study: Prior right duplex to evaluate pseudoaneurysm done  04/16/2024.                   Prior left duplex to evaluate pseudoaneurysm done  04/17/2024.   Performing Technologist: Alberta Lis RVS     Examination  Guidelines: A complete evaluation includes B-mode imaging,  spectral  Doppler, color Doppler, and power Doppler as needed of all accessible  portions  of each vessel. Bilateral testing is considered an integral part of a  complete  examination. Limited examinations for reoccurring indications may be  performed  as noted.      Findings:   An area with well defined borders was visualized arising off of the CFA  with ultrasound characteristics of a pseudoaneurysm. Mixed echos within  the structure suggest that it is partially thrombosed with a residual  diameter of 0.8 cm x 0.9 cm, status post   thrombin injection. Left pseudoaneurysm appears thrombosed status post  thrombin injection.           Assessment/Plan:     89 year old female with above-noted history of celiac and SMA stenting and treatment of bilateral common femoral pseudoaneurysms with thrombin injection.  I do not feel any residual pseudoaneurysm on my physical exam today and there is minimal bruising of the medial left thigh which should resolve.  I recommended continued follow-up with interventional radiology and we can see her on an as-needed basis.     Penne Lonni Colorado MD [facility]

## 2024-05-05 NOTE — Progress Notes (Signed)
 Location:  [facility]   Place of Service:      CODE STATUS:   Allergies  Allergen Reactions   Statins Other (See Comments)    Severe myalgias   Phenergan [Promethazine Hcl] Other (See Comments)    Hallucinations    Hydrocortisone Itching   Lisinopril Other (See Comments)    Lethargy, Fatigue   Prednisone  Rash   Trazodone  And Nefazodone Cough    No chief complaint on file.   HPI:    Past Medical History:  Diagnosis Date   Blood transfusion without reported diagnosis    Cognitive dysfunction 03/04/2019   Patient scores 21 out of 30 on [place] cognitive assessment September 2020   Diabetes mellitus without complication (HCC)    Diabetic peripheral neuropathy associated with type 2 diabetes mellitus (HCC) 02/25/2022   Diverticulitis    Frailty 03/04/2019   H/O bilateral breast reduction surgery    Hyperlipidemia    a. intolerant to statins.    Hypertension     Past Surgical History:  Procedure Laterality Date   ABDOMINAL HYSTERECTOMY  2003   APPENDECTOMY     age 89   BIOPSY  04/10/2023   Procedure: BIOPSY;  Surgeon: Cinderella Deatrice FALCON, MD;  Location: [facility];  Service: Endoscopy;;   BREAST REDUCTION SURGERY     age 78   COLON SURGERY Left 2011   Hemicolectomy due to diverticulitis   ESOPHAGOGASTRODUODENOSCOPY (EGD) WITH PROPOFOL  N/A 04/10/2023   Procedure: ESOPHAGOGASTRODUODENOSCOPY (EGD) WITH PROPOFOL ;  Surgeon: Cinderella Deatrice FALCON, MD;  Location: [facility];  Service: Endoscopy;  Laterality: N/A;   EYE SURGERY  03/30/2009   cataract   IR ANGIOGRAM SELECTIVE EACH ADDITIONAL VESSEL  04/15/2024   IR ANGIOGRAM VISCERAL SELECTIVE  04/11/2023   IR INTRAVASCULAR ULTRASOUND NON CORONARY  04/11/2023   IR RADIOLOGIST EVAL & MGMT  04/12/2024   IR TRANSCATH PLC STENT 1ST ART NOT LE CV CAR VERT CAR  04/11/2023   IR TRANSCATH PLC STENT 1ST ART NOT LE CV CAR VERT CAR  04/15/2024   IR US  GUIDE BX ASP/DRAIN  04/15/2024   IR US  GUIDE BX ASP/DRAIN   04/17/2024   IR US  GUIDE BX ASP/DRAIN  04/17/2024   IR US  GUIDE VASC ACCESS LEFT  04/15/2024   IR US  GUIDE VASC ACCESS RIGHT  04/11/2023   KNEE SURGERY Right 2003   knee cap   LAPAROSCOPIC INCISIONAL / UMBILICAL / VENTRAL HERNIA REPAIR  02/23/2007   REDUCTION MAMMAPLASTY Bilateral 2001   REFRACTIVE SURGERY     TOTAL KNEE ARTHROPLASTY Right 04/10/2022   Procedure: TOTAL KNEE ARTHROPLASTY;  Surgeon: Gerome Charleston, MD;  Location: [facility];  Service: Orthopedics;  Laterality: Right;  adductor canal    Social History   Socioeconomic History   Marital status: Widowed    Spouse name: Not on file   Number of children: Not on file   Years of education: Not on file   Highest education level: Not on file  Occupational History   Not on file  Tobacco Use   Smoking status: Never   Smokeless tobacco: Never  Vaping Use   Vaping status: Never Used  Substance and Sexual Activity   Alcohol  use: No   Drug use: No   Sexual activity: Not Currently    Birth control/protection: Abstinence  Other Topics Concern   Not on file  Social History Narrative   Not on file   Social Drivers of Health   Financial Resource Strain: Low Risk  (09/06/2022)  Overall Financial Resource Strain (CARDIA)    Difficulty of Paying Living Expenses: Not hard at all  Food Insecurity: No Food Insecurity (04/12/2024)   Hunger Vital Sign    Worried About Running Out of Food in the Last Year: Never true    Ran Out of Food in the Last Year: Never true  Transportation Needs: No Transportation Needs (04/22/2024)   PRAPARE - Administrator, Civil Service (Medical): No    Lack of Transportation (Non-Medical): No  Physical Activity: Sufficiently Active (09/06/2022)   Exercise Vital Sign    Days of Exercise per Week: 5 days    Minutes of Exercise per Session: 30 min  Stress: No Stress Concern Present (09/06/2022)   [employer] of Occupational Health - Occupational Stress Questionnaire    Feeling of Stress :  Not at all  Social Connections: Moderately Isolated (04/12/2024)   Social Connection and Isolation Panel    Frequency of Communication with Friends and Family: More than three times a week    Frequency of Social Gatherings with Friends and Family: More than three times a week    Attends Religious Services: 1 to 4 times per year    Active Member of [employer] or Organizations: No    Attends Banker Meetings: Never    Marital Status: Widowed  Intimate Partner Violence: Not At Risk (04/12/2024)   Humiliation, Afraid, Rape, and Kick questionnaire    Fear of Current or Ex-Partner: No    Emotionally Abused: No    Physically Abused: No    Sexually Abused: No   Family History  Problem Relation Age of Onset   Cancer Mother        ovaian   Hypertension Father        kidney   Stroke Father    Cancer Father    Hypertension Sister    Diabetes Brother       VITAL SIGNS There were no vitals taken for this visit.  Outpatient Encounter Medications as of 05/05/2024  Medication Sig   acetaminophen  (TYLENOL ) 325 MG tablet TAKE (2) TABLETS BY MOUTH EVERY SIX HOURS AS NEEDED FOR PAIN. MAX 3GM IN 24 HOURS.   ALPRAZolam  (XANAX ) 0.25 MG tablet Take 1 tablet (0.25 mg total) by mouth at bedtime as needed for anxiety.   amLODipine  (NORVASC ) 10 MG tablet Take 1 tablet (10 mg total) by mouth daily.   aspirin  (ASPIRIN  LOW DOSE) 81 MG chewable tablet CHEW (1) TABLET BY MOUTH ONCE DAILY.   Balsam [place]-Castor Oil (VENELEX) OINT Apply topically as directed. Special Instructions: Apply to bilateral buttocks, sacrum and coccyx qshift. Every Shift Day, Evening, Night   BETA CAROTENE PROVITAMIN A 25000 units capsule TAKE (1) CAPSULE BY MOUTH AT BEDTIME.   cyanocobalamin  1000 MCG tablet Take 1,000 mcg by mouth at bedtime.   cycloSPORINE  (RESTASIS ) 0.05 % ophthalmic emulsion Place 1 drop into both eyes 2 (two) times daily.   estradiol  (ESTRACE ) 0.1 MG/GM vaginal cream Discard plastic applicator. Insert a  blueberry size amount (approximately 1 gram) of cream on fingertip inside vagina at bedtime every night for 1 week then every other night. For long term use.   feeding supplement (ENSURE PLUS HIGH PROTEIN) LIQD Take 237 mLs by mouth 2 (two) times daily between meals.   gabapentin  (NEURONTIN ) 100 MG capsule Take 1 capsule (100 mg total) by mouth daily.   glucose blood (EASYMAX TEST) test strip CHECK BLOOD SUGAR ONCE DAILY.(CALL MD IF BS BELOW 60: OR IF BS ABOVE  400)   hydrALAZINE  (APRESOLINE ) 25 MG tablet Take 1 tablet (25 mg total) by mouth 3 (three) times daily.   IVIZIA DRY EYES 0.5 % SOLN Place 1 drop into both eyes 2 (two) times daily.   lactobacillus (FLORANEX/LACTINEX) PACK Take 1 packet (1 g total) by mouth 3 (three) times daily with meals.   lidocaine  (LIDODERM ) 5 % Place 1 patch onto the skin daily. Remove & Discard patch within 12 hours or as directed by MD   Melatonin 10 MG TABS Take 10 mg by mouth at bedtime.   Menthol , Topical Analgesic, (BIOFREEZE COOL THE PAIN) 4 % GEL Apply 1 Application topically 3 (three) times daily. 1 application; topical Special Instructions: apply to back of neck three times daily due to pain. Three Times A Day 09:00 AM, 02:00 PM, 09:00 PM   metFORMIN  (GLUCOPHAGE ) 500 MG tablet TAKE (1) TABLET BY MOUTH IN THE MORNING & (1/2) TABLET (250MG ) BY MOUTH AT SUPPER   methenamine  (HIPREX ) 1 g tablet Take 1 tablet (1 g total) by mouth 2 (two) times daily with a meal. Most effective when taken with a daily Vitamin C supplement.   Multiple Vitamins-Minerals (PRESERVISION AREDS 2) CAPS Take 1 capsule by mouth in the morning and at bedtime.   omeprazole (PRILOSEC) 40 MG capsule Take 1 capsule (40 mg total) by mouth daily.   ondansetron  (ZOFRAN ) 4 MG tablet Take 1 tablet (4 mg total) by mouth every 6 (six) hours as needed for nausea or vomiting.   polyethylene glycol (MIRALAX  / GLYCOLAX ) 17 g packet Take 17 g by mouth daily as needed for moderate constipation.   Safety  Lancets 28G MISC CHECK BLOOD SUGAR ONCE DAILY.   tamsulosin  (FLOMAX ) 0.4 MG CAPS capsule Take 1 capsule (0.4 mg total) by mouth daily.   vitamin D3 (CHOLECALCIFEROL ) 25 MCG tablet Take 1,000 Units by mouth daily.   [DISCONTINUED] albuterol  (PROVENTIL ) (2.5 MG/3ML) 0.083% nebulizer solution Inhale 3 mLs (2.5 mg total) into the lungs every 6 (six) hours as needed for wheezing or shortness of breath.   [DISCONTINUED] ALPRAZolam  (XANAX ) 0.25 MG tablet Take 1 tablet (0.25 mg total) by mouth at bedtime as needed for anxiety.   [DISCONTINUED] amLODipine  (NORVASC ) 10 MG tablet TAKE (1) TABLET BY MOUTH ONCE DAILY.   [DISCONTINUED] bacitracin  500 UNIT/GM ointment Apply 1 Application topically 2 (two) times daily for 7 days.   [DISCONTINUED] cycloSPORINE  (RESTASIS ) 0.05 % ophthalmic emulsion Place 1 drop into both eyes 2 (two) times daily.   [DISCONTINUED] gabapentin  (NEURONTIN ) 100 MG capsule Take 1 capsule (100 mg total) by mouth daily.   [DISCONTINUED] hydrALAZINE  (APRESOLINE ) 25 MG tablet Take 1 tablet (25 mg total) by mouth 3 (three) times daily.   [DISCONTINUED] ketoconazole  (NIZORAL ) 2 % cream Apply 1 Application topically 2 (two) times daily. For 10 days to groin rash (Patient not taking: Reported on 05/05/2024)   [DISCONTINUED] lidocaine  (LIDODERM ) 5 % Place 1 patch onto the skin daily. Remove & Discard patch within 12 hours or as directed by MD   [DISCONTINUED] metFORMIN  (GLUCOPHAGE ) 500 MG tablet TAKE (1) TABLET BY MOUTH IN THE MORNING & (1/2) TABLET (250MG ) BY MOUTH AT SUPPER   [DISCONTINUED] methenamine  (HIPREX ) 1 g tablet Take 1 tablet (1 g total) by mouth 2 (two) times daily with a meal. Most effective when taken with a daily Vitamin C supplement.   [DISCONTINUED] mupirocin  ointment (BACTROBAN ) 2 % Apply thin amount twice daily to rash in groin creases twice daily for 5 days at a time when  rash occurs (Patient not taking: Reported on 05/05/2024)   [DISCONTINUED] omeprazole (PRILOSEC) 40 MG capsule  Take 40 mg by mouth daily.   [DISCONTINUED] ondansetron  (ZOFRAN ) 4 MG tablet Take 1 tablet (4 mg total) by mouth every 6 (six) hours as needed for nausea or vomiting.   [DISCONTINUED] pantoprazole  (PROTONIX ) 40 MG tablet Take 1 tablet (40 mg total) by mouth 2 (two) times daily before a meal.   [DISCONTINUED] tamsulosin  (FLOMAX ) 0.4 MG CAPS capsule Take 1 capsule (0.4 mg total) by mouth daily.   [DISCONTINUED] Vitamin D , Cholecalciferol , 10 MCG (400 UNIT) TABS TAKE (1) TABLET BY MOUTH ONCE DAILY. (Patient not taking: Reported on 05/05/2024)   [DISCONTINUED] Vitamins A & D (VITAMIN A & D) ointment Apply topically as needed for dry skin (Leg). (Patient not taking: Reported on 05/05/2024)   No facility-administered encounter medications on file as of 05/05/2024.     SIGNIFICANT DIAGNOSTIC EXAMS       ASSESSMENT/ PLAN:     Barnie Seip NP [facility]  Contact [phone] Monday through Friday 8am- 5pm  After hours call [phone]

## 2024-05-06 ENCOUNTER — Telehealth: Payer: Self-pay

## 2024-05-06 NOTE — Telephone Encounter (Signed)
 Copied from CRM #[id number]. Topic: Clinical - Medication Question >> May 06, 2024  3:40 PM Fonda T wrote: Reason for CRM: Per caller, Kyra with [facility] states [facility] released patient with orders to use solution in nebulizer but did not prescribe medication.   Per caller states per patient advised, she has never used this medication, and does not want to use it. Wants to know if that medication can be discontinued.   Requesting a follow up call to [phone], to advise further.

## 2024-05-07 ENCOUNTER — Telehealth: Payer: Self-pay

## 2024-05-07 NOTE — Telephone Encounter (Signed)
 It would be fine to discontinue this order.  May discontinue nebulizer if they need a written order please write it out on a prescription pad and have Elveria sign it then fax it-we will see her on the 14th as planned

## 2024-05-07 NOTE — Telephone Encounter (Signed)
 This was a patient at a skilled nursing facility and all calls should be directed to [facility] (see last note/discharge summary from [facility]). Call returned to [facility] and instructed to call [facility].

## 2024-05-07 NOTE — Transitions of Care (Post Inpatient/ED Visit) (Signed)
   05/07/2024  Name: [name] MRN: [id number] DOB: Feb 28, 1932  Today's TOC FU Call Status: Today's TOC FU Call Status:: Unsuccessful Call (1st Attempt) Unsuccessful Call (1st Attempt) Date: 05/07/24  Attempted to reach the patient regarding the most recent Inpatient/ED visit.  Follow Up Plan: Additional outreach attempts will be made to reach the patient to complete the Transitions of Care (Post Inpatient/ED visit) call.   Signature Julian Lemmings, LPN [facility] Nurse Health Advisor Direct Dial [phone]

## 2024-05-07 NOTE — Telephone Encounter (Signed)
 Mauro Elveria BROCKS, NP     05/06/24  4:53 PM This is difficult since we do not know what medication for nebs and rationale. However, if she can make her own medical decisions, it needs to be discontinued at least until someone can explain why she needs it. Thanks.

## 2024-05-07 NOTE — Telephone Encounter (Signed)
 Copied from CRM [id number]. Topic: Clinical - Prescription Issue >> May 07, 2024  9:31 AM Diannia H wrote: Reason for CRM: Vernell from the pharmacy called and wanted to clarify some orders for the patient. Could you assist? Callback number is [phone].

## 2024-05-10 ENCOUNTER — Telehealth: Payer: Self-pay

## 2024-05-10 NOTE — Transitions of Care (Post Inpatient/ED Visit) (Unsigned)
   05/10/2024  Name: [name] MRN: [id number] DOB: [birth year]  Today's TOC FU Call Status: Today's TOC FU Call Status:: Unsuccessful Call (2nd Attempt) Unsuccessful Call (1st Attempt) Date: 05/07/24 Unsuccessful Call (2nd Attempt) Date: 05/10/24  Attempted to reach the patient regarding the most recent Inpatient/ED visit.  Follow Up Plan: Additional outreach attempts will be made to reach the patient to complete the Transitions of Care (Post Inpatient/ED visit) call.   Signature Julian Lemmings, LPN [facility] Nurse Health Advisor Direct Dial [phone]

## 2024-05-10 NOTE — Telephone Encounter (Signed)
 [facility] Care Plan dropped off to be completed placed in red folder

## 2024-05-11 NOTE — Telephone Encounter (Signed)
 Verbal order given to [facility] - the will send over written orders to be signed

## 2024-05-11 NOTE — Transitions of Care (Post Inpatient/ED Visit) (Signed)
 05/11/2024  Name: [name] MRN: [id number] DOB: March 26, 1932  Today's TOC FU Call Status: Today's TOC FU Call Status:: Successful TOC FU Call Completed Unsuccessful Call (1st Attempt) Date: 05/07/24 Unsuccessful Call (2nd Attempt) Date: 05/10/24 [facility] FU Call Complete Date: 05/11/24  Patient's Name and Date of Birth confirmed. Name, DOB  Transition Care Management Follow-up Telephone Call Date of Discharge: 05/06/24 Discharge Facility: Other [facility]) Name of Other ([facility]) Discharge Facility: [facility] Type of Discharge: Inpatient Admission How have you been since you were released from the hospital?: Better Any questions or concerns?: No  Items Reviewed: Did you receive and understand the discharge instructions provided?: Yes Medications obtained,verified, and reconciled?: Yes (Medications Reviewed) Any new allergies since your discharge?: No Dietary orders reviewed?: Yes Do you have support at home?: Yes People in Home [RPT]: facility resident  Medications Reviewed Today: Medications Reviewed Today     Reviewed by Emmitt Pan, LPN (Licensed Practical Nurse) on 05/11/24 at 1150  Med List Status: <None>   Medication Order Taking? Sig Documenting Provider Last Dose Status Informant  acetaminophen  (TYLENOL ) 325 MG tablet [id number] Yes TAKE (2) TABLETS BY MOUTH EVERY SIX HOURS AS NEEDED FOR PAIN. MAX 3GM IN 24 HOURS. [name], MD  Active Nursing Home Medication Administration Guide (MAG)  ALPRAZolam  (XANAX ) 0.25 MG tablet [id number] Yes Take 1 tablet (0.25 mg total) by mouth at bedtime as needed for anxiety. Landy Barnie RAMAN, NP  Active   amLODipine  (NORVASC ) 10 MG tablet [id number] Yes Take 1 tablet (10 mg total) by mouth daily. Landy Barnie RAMAN, NP  Active   aspirin  (ASPIRIN  LOW DOSE) 81 MG chewable tablet [id number] Yes CHEW (1) TABLET BY MOUTH ONCE DAILY. [name], MD  Active Nursing Home Medication Administration Guide (MAG)  [address]  [place]) [place] [id number] Yes Apply topically as directed. Special Instructions: Apply to bilateral buttocks, sacrum and coccyx qshift. Every Shift Day, Evening, Night [provider]  Active   BETA CAROTENE PROVITAMIN A 25000 units capsule [id number] Yes TAKE (1) CAPSULE BY MOUTH AT BEDTIME. [name], MD  Active Nursing Home Medication Administration Guide (MAG)  cyanocobalamin  1000 MCG tablet [id number] Yes Take 1,000 mcg by mouth at bedtime. [provider]  Active Nursing Home Medication Administration Guide (MAG)  cycloSPORINE  (RESTASIS ) 0.05 % ophthalmic emulsion [id number] Yes Place 1 drop into both eyes 2 (two) times daily. Landy Barnie RAMAN, NP  Active   estradiol  (ESTRACE ) 0.1 MG/GM vaginal cream [id number] Yes Discard plastic applicator. Insert a blueberry size amount (approximately 1 gram) of cream on fingertip inside vagina at bedtime every night for 1 week then every other night. For long term use. Gerldine Lauraine BROCKS, FNP  Active Nursing Home Medication Administration Guide (MAG)  feeding supplement (ENSURE PLUS HIGH PROTEIN) LIQD [id number] Yes Take 237 mLs by mouth 2 (two) times daily between meals. Dino Antu, MD  Active   gabapentin  (NEURONTIN ) 100 MG capsule [id number] Yes Take 1 capsule (100 mg total) by mouth daily. Landy Barnie RAMAN, NP  Active   glucose blood (EASYMAX TEST) test strip [id number] Yes CHECK BLOOD SUGAR ONCE DAILY.(CALL MD IF BS BELOW 60: OR IF BS ABOVE 400) [place], Glendia LABOR, MD  Active Nursing Home Medication Administration Guide (MAG)  hydrALAZINE  (APRESOLINE ) 25 MG tablet [id number] Yes Take 1 tablet (25 mg total) by mouth 3 (three) times daily. Landy Barnie RAMAN, NP  Active   IVIZIA DRY EYES 0.5 % SOLN [id number] Yes Place 1 drop into both eyes  2 (two) times daily. [provider]  Active Nursing Home Medication Administration Guide (MAG)  lactobacillus (FLORANEX/LACTINEX) PACK [id number] Yes Take 1 packet (1 g total) by mouth 3 (three)  times daily with meals. Dino Antu, MD  Active   lidocaine  (LIDODERM ) 5 % [id number] Yes Place 1 patch onto the skin daily. Remove & Discard patch within 12 hours or as directed by MD Landy Barnie RAMAN, NP  Active   Melatonin 10 MG TABS [id number] Yes Take 10 mg by mouth at bedtime. [place], [place], [place]  Active Nursing Home Medication Administration Guide (MAG)  Menthol , Topical Analgesic, (BIOFREEZE COOL THE PAIN) 4 % GEL [id number] Yes Apply 1 Application topically 3 (three) times daily. 1 application; topical Special Instructions: apply to back of neck three times daily due to pain. Three Times A Day 09:00 AM, 02:00 PM, 09:00 PM [provider]  Active   metFORMIN  (GLUCOPHAGE ) 500 MG tablet [id number] Yes TAKE (1) TABLET BY MOUTH IN THE MORNING & (1/2) TABLET (250MG ) BY MOUTH AT SUPPER Landy Barnie RAMAN, NP  Active   methenamine  (HIPREX ) 1 g tablet [id number] Yes Take 1 tablet (1 g total) by mouth 2 (two) times daily with a meal. Most effective when taken with a daily Vitamin C supplement. Landy Barnie RAMAN, NP  Active   Multiple Vitamins-Minerals (PRESERVISION AREDS 2) CAPS [id number] Yes Take 1 capsule by mouth in the morning and at bedtime. [provider]  Active Nursing Home Medication Administration Guide (MAG)  omeprazole (PRILOSEC) 40 MG capsule [id number] Yes Take 1 capsule (40 mg total) by mouth daily. Landy Barnie RAMAN, NP  Active   ondansetron  (ZOFRAN ) 4 MG tablet [id number] Yes Take 1 tablet (4 mg total) by mouth every 6 (six) hours as needed for nausea or vomiting. Landy Barnie RAMAN, NP  Active   polyethylene glycol (MIRALAX  / GLYCOLAX ) 17 g packet [id number] Yes Take 17 g by mouth daily as needed for moderate constipation. Dino Antu, MD  Active   Safety Lancets 28G MISC [id number] Yes CHECK BLOOD SUGAR ONCE DAILY. [name], MD  Active Nursing Home Medication Administration Guide (MAG)  tamsulosin  (FLOMAX ) 0.4 MG CAPS capsule [id number] Yes Take 1 capsule (0.4 mg  total) by mouth daily. Landy Barnie RAMAN, NP  Active   vitamin D3 (CHOLECALCIFEROL ) 25 MCG tablet [id number] Yes Take 1,000 Units by mouth daily. [provider]  Active   Med List Note Christie Alyson Sola 03/26/22 [id number]): [facility] [phone]            Home Care and Equipment/Supplies: Were Home Health Services Ordered?: NA Any new equipment or medical supplies ordered?: NA  Functional Questionnaire: Do you need assistance with bathing/showering or dressing?: Yes Do you need assistance with meal preparation?: Yes Do you need assistance with eating?: No Do you have difficulty maintaining continence: No Do you need assistance with getting out of bed/getting out of a chair/moving?: No Do you have difficulty managing or taking your medications?: No  Follow up appointments reviewed: PCP Follow-up appointment confirmed?: Yes Date of PCP follow-up appointment?: 05/14/24 Follow-up Provider: [facility] Follow-up appointment confirmed?: NA Do you need transportation to your follow-up appointment?: No Do you understand care options if your condition(s) worsen?: Yes-patient verbalized understanding    SIGNATURE Julian Lemmings, LPN [facility] Nurse Health Advisor Direct Dial [phone]

## 2024-05-12 ENCOUNTER — Encounter (INDEPENDENT_AMBULATORY_CARE_PROVIDER_SITE_OTHER): Payer: Self-pay

## 2024-05-12 ENCOUNTER — Ambulatory Visit (INDEPENDENT_AMBULATORY_CARE_PROVIDER_SITE_OTHER)

## 2024-05-12 VITALS — BP 186/76 | HR 86 | Temp 98.0°F | Wt 170.0 lb

## 2024-05-12 DIAGNOSIS — C44311 Basal cell carcinoma of skin of nose: Secondary | ICD-10-CM

## 2024-05-12 NOTE — Progress Notes (Signed)
 HPI:   05/03/2024 [name] is a 89 year old female who presents with throat discomfort and a nasal lesion. She is accompanied by her daughter-in-law. She states she doesn't really have any issues with her throat though she does feel she needs to use a lozenge every now and then. She is currently at a rehab facility where she feels it is very dry so the lozenges help. She drinks five to six 8-ounce glasses of water  daily, along with coffee, apple juice, orange juice, and two 8-ounce Ensures. No dry mouth, swallowing difficulties, or choking on food.   She states her primary concern today is a lesion on the tip of her nose that has been present since 2021. The lesion has grown slightly over time. It occasionally bleeds, especially when she washes her face or scratches it. She has no personal history of skin cancer, but her brother had skin cancer.  Today (05/12/2024) Patient presents in follow-up regarding biopsy of the nasal tip lesion at her last visit. She is accompanied by a [employer] employee who is assisting in history. She denies any changes in the lesion since last visit. Denies any new complaints.   PMH/Meds/All/SocHx/FamHx/ROS: Past Medical History:  Diagnosis Date   Blood transfusion without reported diagnosis    Cognitive dysfunction 03/04/2019   Patient scores 21 out of 30 on [place] cognitive assessment September 2020   Diabetes mellitus without complication (HCC)    Diabetic peripheral neuropathy associated with type 2 diabetes mellitus (HCC) 02/25/2022   Diverticulitis    Frailty 03/04/2019   H/O bilateral breast reduction surgery    Hyperlipidemia    a. intolerant to statins.    Hypertension    Past Surgical History:  Procedure Laterality Date   ABDOMINAL HYSTERECTOMY  2003   APPENDECTOMY     age 49   BIOPSY  04/10/2023   Procedure: BIOPSY;  Surgeon: Cinderella Deatrice FALCON, MD;  [facility];  Service: Endoscopy;;   BREAST REDUCTION SURGERY     age 15    COLON SURGERY Left 2011   Hemicolectomy due to diverticulitis   ESOPHAGOGASTRODUODENOSCOPY (EGD) WITH PROPOFOL  N/A 04/10/2023   Procedure: ESOPHAGOGASTRODUODENOSCOPY (EGD) WITH PROPOFOL ;  Surgeon: Cinderella Deatrice FALCON, MD;  [facility];  Service: Endoscopy;  Laterality: N/A;   EYE SURGERY  03/30/2009   cataract   IR ANGIOGRAM SELECTIVE EACH ADDITIONAL VESSEL  04/15/2024   IR ANGIOGRAM VISCERAL SELECTIVE  04/11/2023   IR INTRAVASCULAR ULTRASOUND NON CORONARY  04/11/2023   IR RADIOLOGIST EVAL & MGMT  04/12/2024   IR TRANSCATH PLC STENT 1ST ART NOT LE CV CAR VERT CAR  04/11/2023   IR TRANSCATH PLC STENT 1ST ART NOT LE CV CAR VERT CAR  04/15/2024   IR US  GUIDE BX ASP/DRAIN  04/15/2024   IR US  GUIDE BX ASP/DRAIN  04/17/2024   IR US  GUIDE BX ASP/DRAIN  04/17/2024   IR US  GUIDE VASC ACCESS LEFT  04/15/2024   IR US  GUIDE VASC ACCESS RIGHT  04/11/2023   KNEE SURGERY Right 2003   knee cap   LAPAROSCOPIC INCISIONAL / UMBILICAL / VENTRAL HERNIA REPAIR  02/23/2007   REDUCTION MAMMAPLASTY Bilateral 2001   REFRACTIVE SURGERY     TOTAL KNEE ARTHROPLASTY Right 04/10/2022   Procedure: TOTAL KNEE ARTHROPLASTY;  Surgeon: Gerome Charleston, MD;  Location: [facility];  Service: Orthopedics;  Laterality: Right;  adductor canal   No family history of bleeding disorders, wound healing problems or difficulty with anesthesia.  Social Connections:  Moderately Isolated (04/12/2024)   Social Connection and Isolation Panel    Frequency of Communication with Friends and Family: More than three times a week    Frequency of Social Gatherings with Friends and Family: More than three times a week    Attends Religious Services: 1 to 4 times per year    Active Member of [employer] or Organizations: No    Attends Banker Meetings: Never    Marital Status: Widowed    Current Outpatient Medications:    acetaminophen  (TYLENOL ) 325 MG tablet, TAKE (2) TABLETS BY MOUTH EVERY SIX HOURS AS NEEDED FOR PAIN. MAX 3GM  IN 24 HOURS., Disp: 120 tablet, Rfl: 5   ALPRAZolam  (XANAX ) 0.25 MG tablet, Take 1 tablet (0.25 mg total) by mouth at bedtime as needed for anxiety., Disp: 10 tablet, Rfl: 0   amLODipine  (NORVASC ) 10 MG tablet, Take 1 tablet (10 mg total) by mouth daily., Disp: 30 tablet, Rfl: 0   aspirin  (ASPIRIN  LOW DOSE) 81 MG chewable tablet, CHEW (1) TABLET BY MOUTH ONCE DAILY., Disp: 30 tablet, Rfl: 12   BETA CAROTENE PROVITAMIN A 25000 units capsule, TAKE (1) CAPSULE BY MOUTH AT BEDTIME., Disp: 30 capsule, Rfl: 0   feeding supplement (ENSURE PLUS HIGH PROTEIN) LIQD, Take 237 mLs by mouth 2 (two) times daily between meals., Disp: , Rfl:    gabapentin  (NEURONTIN ) 100 MG capsule, Take 1 capsule (100 mg total) by mouth daily., Disp: 30 capsule, Rfl: 0   hydrALAZINE  (APRESOLINE ) 25 MG tablet, Take 1 tablet (25 mg total) by mouth 3 (three) times daily., Disp: 90 tablet, Rfl: 0   IVIZIA DRY EYES 0.5 % SOLN, Place 1 drop into both eyes 2 (two) times daily., Disp: , Rfl:    lactobacillus (FLORANEX/LACTINEX) PACK, Take 1 packet (1 g total) by mouth 3 (three) times daily with meals., Disp: , Rfl:    Melatonin 10 MG TABS, Take 10 mg by mouth at bedtime., Disp: 30 tablet, Rfl: 3   Menthol , Topical Analgesic, (BIOFREEZE COOL THE PAIN) 4 % GEL, Apply 1 Application topically 3 (three) times daily. 1 application; topical Special Instructions: apply to back of neck three times daily due to pain. Three Times A Day 09:00 AM, 02:00 PM, 09:00 PM, Disp: , Rfl:    metFORMIN  (GLUCOPHAGE ) 500 MG tablet, TAKE (1) TABLET BY MOUTH IN THE MORNING & (1/2) TABLET (250MG ) BY MOUTH AT SUPPER, Disp: 45 tablet, Rfl: 0   methenamine  (HIPREX ) 1 g tablet, Take 1 tablet (1 g total) by mouth 2 (two) times daily with a meal. Most effective when taken with a daily Vitamin C supplement., Disp: 60 tablet, Rfl: 0   Multiple Vitamins-Minerals (PRESERVISION AREDS 2) CAPS, Take 1 capsule by mouth in the morning and at bedtime., Disp: , Rfl:    omeprazole  (PRILOSEC) 40 MG capsule, Take 1 capsule (40 mg total) by mouth daily., Disp: 30 capsule, Rfl: 0   ondansetron  (ZOFRAN ) 4 MG tablet, Take 1 tablet (4 mg total) by mouth every 6 (six) hours as needed for nausea or vomiting., Disp: 20 tablet, Rfl: 0   polyethylene glycol (MIRALAX  / GLYCOLAX ) 17 g packet, Take 17 g by mouth daily as needed for moderate constipation., Disp: , Rfl:    sodium chloride  1 g tablet, Take 1 g by mouth 2 (two) times daily., Disp: , Rfl:    tamsulosin  (FLOMAX ) 0.4 MG CAPS capsule, Take 1 capsule (0.4 mg total) by mouth daily., Disp: 30 capsule, Rfl: 0   vitamin D3 (  CHOLECALCIFEROL ) 25 MCG tablet, Take 1,000 Units by mouth daily., Disp: , Rfl:    Balsam [place]-Castor Oil (VENELEX) OINT, Apply topically as directed. Special Instructions: Apply to bilateral buttocks, sacrum and coccyx qshift. Every Shift Day, Evening, Night (Patient not taking: Reported on 05/12/2024), Disp: , Rfl:    cyanocobalamin  1000 MCG tablet, Take 1,000 mcg by mouth at bedtime. (Patient not taking: Reported on 05/12/2024), Disp: , Rfl:    cycloSPORINE  (RESTASIS ) 0.05 % ophthalmic emulsion, Place 1 drop into both eyes 2 (two) times daily. (Patient not taking: Reported on 05/12/2024), Disp: 60 each, Rfl: 0   estradiol  (ESTRACE ) 0.1 MG/GM vaginal cream, Discard plastic applicator. Insert a blueberry size amount (approximately 1 gram) of cream on fingertip inside vagina at bedtime every night for 1 week then every other night. For long term use. (Patient not taking: Reported on 05/12/2024), Disp: 30 g, Rfl: 3   glucose blood (EASYMAX TEST) test strip, CHECK BLOOD SUGAR ONCE DAILY.(CALL MD IF BS BELOW 60: OR IF BS ABOVE 400), Disp: 50 strip, Rfl: 3   lidocaine  (LIDODERM ) 5 %, Place 1 patch onto the skin daily. Remove & Discard patch within 12 hours or as directed by MD (Patient not taking: Reported on 05/12/2024), Disp: 30 patch, Rfl: 0   Safety Lancets 28G MISC, CHECK BLOOD SUGAR ONCE DAILY. (Patient not taking:  Reported on 05/12/2024), Disp: 100 each, Rfl: 0 A complete ROS was performed with pertinent positives/negatives noted in the HPI. The remainder of the ROS are negative.   Physical Exam:  BP (!) 186/76 (BP Location: Right Arm, Patient Position: Sitting, Cuff Size: Normal) Comment: I think BP machine is not working right  Pulse 86   Temp 98 F (36.7 C)   Wt 170 lb (77.1 kg)   SpO2 97%   BMI 25.85 kg/m  General: Well developed, well nourished. No acute distress. Voice normal Head/Face: Normocephalic. No sinus tenderness. Facial nerve intact and equal bilaterally. No facial lacerations. Eyes: PERRL, no scleral icterus or conjunctival hemorrhage. EOMI. Ears: No gross deformity. Normal external canal. Tympanic membrane in tact bilaterally Hearing: Normal speech reception.  Nose: Nasal tip lesion s/p biopsy healing with rim of pearly pink tissue with telangectasia noted, no ulceration. No purulent discharge. No turbinate hypertrophy. Mouth/Oropharynx: Lips without any lesions. Dentition fair. No mucosal lesions within the oropharynx. No tonsillar enlargement, exudate, or lesions. Pharyngeal walls symmetrical. Uvula midline. Tongue midline without lesions. Larynx: See TFL if applicable Nasopharynx: See TFL if applicable Neck: Trachea midline. No masses. No thyromegaly or nodules palpated. No crepitus. Lymphatic: No lymphadenopathy in the neck. Respiratory: No stridor or distress. Room air. Cardiovascular: Regular rate and rhythm. Extremities: No edema or cyanosis. Warm and well-perfused. Skin: No scars or lesions on face or neck. Neurologic: CN II-XII grossly intact. Moving all extremities without gross abnormality. Other:  Independent Review of Additional Tests or Records: None  Impression & Plans: Assessment & Plan Basal Cell Carcinoma of the Nasal Tip Lesion present for four years, growth and occasional bleeding noted.  - Pathology results reviewed - BCC confirmed - Will schedule in  OR for excision and reconstruction - Discussed procedure at length with patient. All risks, benefits, and alternatives of procedure discussed. All questions answered to patient satisfaction   Follow-up 1 week post-op  Adah Malkin, DO [facility]

## 2024-05-13 ENCOUNTER — Other Ambulatory Visit: Payer: Self-pay | Admitting: Family Medicine

## 2024-05-13 NOTE — Telephone Encounter (Signed)
 Form was filled out

## 2024-05-14 ENCOUNTER — Other Ambulatory Visit: Payer: Self-pay | Admitting: Family Medicine

## 2024-05-14 ENCOUNTER — Encounter: Payer: Self-pay | Admitting: Family Medicine

## 2024-05-14 ENCOUNTER — Telehealth: Payer: Self-pay

## 2024-05-14 ENCOUNTER — Ambulatory Visit: Payer: Self-pay | Admitting: Family Medicine

## 2024-05-14 VITALS — BP 148/70 | HR 86 | Temp 97.2°F | Ht 68.0 in | Wt 172.0 lb

## 2024-05-14 DIAGNOSIS — C44311 Basal cell carcinoma of skin of nose: Secondary | ICD-10-CM | POA: Diagnosis not present

## 2024-05-14 DIAGNOSIS — I10 Essential (primary) hypertension: Secondary | ICD-10-CM

## 2024-05-14 DIAGNOSIS — E119 Type 2 diabetes mellitus without complications: Secondary | ICD-10-CM | POA: Diagnosis not present

## 2024-05-14 DIAGNOSIS — Z7984 Long term (current) use of oral hypoglycemic drugs: Secondary | ICD-10-CM | POA: Diagnosis not present

## 2024-05-14 DIAGNOSIS — E871 Hypo-osmolality and hyponatremia: Secondary | ICD-10-CM | POA: Diagnosis not present

## 2024-05-14 DIAGNOSIS — R339 Retention of urine, unspecified: Secondary | ICD-10-CM | POA: Diagnosis not present

## 2024-05-14 DIAGNOSIS — D500 Iron deficiency anemia secondary to blood loss (chronic): Secondary | ICD-10-CM | POA: Diagnosis not present

## 2024-05-14 MED ORDER — ALPRAZOLAM 0.25 MG PO TABS: 0.2500 mg | ORAL_TABLET | Freq: Every day | ORAL | 5 refills | Status: DC

## 2024-05-14 MED ORDER — PANTOPRAZOLE SODIUM 40 MG PO TBEC: 40.0000 mg | DELAYED_RELEASE_TABLET | Freq: Every day | ORAL | 6 refills | Status: DC

## 2024-05-14 MED ORDER — METAMUCIL SMOOTH TEXTURE 58.6 % PO POWD: ORAL | 12 refills | Status: DC

## 2024-05-14 MED ORDER — SODIUM CHLORIDE 1 G PO TABS
ORAL_TABLET | ORAL | 12 refills | Status: AC
Start: 2024-05-14 — End: ?

## 2024-05-14 MED ORDER — AMLODIPINE BESYLATE 10 MG PO TABS: 10.0000 mg | ORAL_TABLET | Freq: Every day | ORAL | 12 refills | Status: DC

## 2024-05-14 MED ORDER — METFORMIN HCL 500 MG PO TABS: ORAL_TABLET | ORAL | 12 refills | Status: AC

## 2024-05-14 MED ORDER — TAMSULOSIN HCL 0.4 MG PO CAPS: 0.4000 mg | ORAL_CAPSULE | Freq: Every day | ORAL | 12 refills | Status: DC

## 2024-05-14 MED ORDER — HYDRALAZINE HCL 25 MG PO TABS: 25.0000 mg | ORAL_TABLET | Freq: Three times a day (TID) | ORAL | 12 refills | Status: AC

## 2024-05-14 NOTE — Telephone Encounter (Signed)
 Reason for CRM: [facility] wants us  to update the medlist - either fax or call..  Remove: albuteral/neb solution.SABRA are we removing ALPRAZolam  (XANAX ) 0.25 MG tablet or keeping.SABRA and they got notice to remove lactulose but isn't on their current list.  Fax [phone] or call [phone] - they rcvd this today. So they are just confirming.    Please advise up to date med list

## 2024-05-14 NOTE — Telephone Encounter (Signed)
 Called patient and left message. Doctor discontinued omeprazole and prescribed pantoprazole . Med sent in 05/14/2024.

## 2024-05-14 NOTE — Progress Notes (Signed)
   Subjective:    Patient ID: [name], female    DOB: 12-07-1931, 89 y.o.   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 She had procedure done to help correct this She states she is actually feeling better Abdominal pain is easing up She is on multiple medicines Has history of diabetes, vascular disease, hyperlipidemia, hypertension and insomnia She states her appetite is doing okay she is able to eat well without any severe abdominal pain Denies heartburn issues but would like to be on Protonix  instead of omeprazole Using gabapentin  for diabetic neuropathy seems to be doing okay Uses alprazolam  at nighttime to help her rest she states she gets very wired at the rest home she requested a higher dose but I do not think that is a good idea She does take her blood pressure medicine regular basis Review of Systems     Objective:   Physical Exam  General-in no acute distress Eyes-no discharge Lungs-respiratory rate normal, CTA CV-no murmurs,RRR Extremities skin warm dry no edema Neuro grossly normal Behavior normal, alert       Assessment & Plan:  1. Incomplete bladder emptying She does take tamsulosin  - tamsulosin  (FLOMAX ) 0.4 MG CAPS capsule; Take 1 capsule (0.4 mg total) by mouth daily.  Dispense: 30 capsule; Refill: 12  2. Controlled type 2 diabetes mellitus without complication, without long-term current use of insulin  (HCC) Recent A1c looks good continue metformin  - metFORMIN  (GLUCOPHAGE ) 500 MG tablet; TAKE (1) TABLET BY MOUTH IN THE MORNING & (1/2) TABLET (250MG ) BY MOUTH AT SUPPER  Dispense: 45 tablet; Refill: 12  3. Essential hypertension Blood pressure borderline but acceptable for her age - amLODipine  (NORVASC ) 10 MG tablet; Take 1 tablet (10 mg total) by  mouth daily.  Dispense: 30 tablet; Refill: 12  Insomnia-patient states that she benefits from taking alprazolam  she would like to continue this GERD-she states omeprazole causes her a lot of GI side effects she wants to go back to pantoprazole  Mild constipation she would like to switch from lactulose over to Metamucil twice daily History of hyponatremia she would like to continue her sodium we will do that 1/day Basal cell cancer will be going through treatments from ENT for surgery History anemia do follow-up lab work

## 2024-05-14 NOTE — Telephone Encounter (Signed)
 duplicate

## 2024-05-14 NOTE — Telephone Encounter (Signed)
 Trying to renew the vitamin D  for 6 refills  Alprazolam  we are keeping at 0.25 mg each evening I just sent in a prescription for that and wrote it on the orders forms that they brought from [facility] Also I wrote on there to stop lactulose because that was on her med list from the nursing home thanks

## 2024-05-14 NOTE — Progress Notes (Signed)
   Subjective:    Patient ID: [name], female    DOB: [birth year], 89 y.o.   MRN: [id number]  HPI    Review of Systems     Objective:   Physical Exam        Assessment & Plan:

## 2024-05-14 NOTE — Telephone Encounter (Signed)
 Copied from CRM #[id number]. Topic: Clinical - Prescription Issue >> May 14, 2024  3:22 PM Emylou G wrote: Reason for CRM: [facility] wants us  to update the medlist - either fax or call..  Remove: albuteral/neb solution.SABRA are we removing ALPRAZolam  (XANAX ) 0.25 MG tablet or keeping.SABRA and they got notice to remove lactulose but isn't on their current list.  Fax [phone] or call [phone] - they rcvd this today. So they are just confirming.

## 2024-05-14 NOTE — Telephone Encounter (Signed)
 Copied from CRM #[id number]. Topic: Clinical - Medication Refill >> May 14, 2024 10:48 AM Zebedee SAUNDERS wrote: Medication: omeprazole 40 mg 1 daily, vitamin D3 (CHOLECALCIFEROL ) 25 MCG tablet  Has the patient contacted their pharmacy? Yes (Agent: If no, request that the patient contact the pharmacy for the refill. If patient does not wish to contact the pharmacy document the reason why and proceed with request.) (Agent: If yes, when and what did the pharmacy advise?)Pharmacy need script  This is the patient's preferred pharmacy:  [facility], [place] - [address] [place] [address] Phone: [phone] Fax: [phone]   Is this the correct pharmacy for this prescription? Yes If no, delete pharmacy and type the correct one.   Has the prescription been filled recently? Yes  Is the patient out of the medication? Yes  Has the patient been seen for an appointment in the last year OR does the patient have an upcoming appointment? Yes  Can we respond through MyChart? No  Agent: Please be advised that Rx refills may take up to 3 business days. We ask that you follow-up with your pharmacy.

## 2024-05-17 DIAGNOSIS — R278 Other lack of coordination: Secondary | ICD-10-CM | POA: Diagnosis not present

## 2024-05-17 DIAGNOSIS — M6281 Muscle weakness (generalized): Secondary | ICD-10-CM | POA: Diagnosis not present

## 2024-05-17 NOTE — Progress Notes (Unsigned)
 Impression/Assessment:  Recent uncomplicated UTI.  Patient unable to void for us  today  Incomplete bladder emptying/large bladder.  As she has not had significant issues over the long term, I do not think we necessarily need to intervene here  Plan:      History of Present Illness:  11.19.2024: Initial visit for evaluation and management of urinary tract infection.  She was admitted to the hospital for hyponatremia last month.  She had weakness.  Several emergency room visits before the final ER visit/hospitalization.  She also had pneumonia.  At one of the ER visits she had positive urinalysis, grew Klebsiella and Morganella.  Treated for this.  No longstanding history of recurrent urinary tract infections.  No significant lower urinary tract symptomatology.  She has noted no blood in her urine.  She has not needed to see urologist before.  Currently she is asymptomatic.  Past Medical History:  Diagnosis Date   Blood transfusion without reported diagnosis    Cognitive dysfunction 03/04/2019   Patient scores 21 out of 30 on [place] cognitive assessment September 2020   Diabetes mellitus without complication (HCC)    Diabetic peripheral neuropathy associated with type 2 diabetes mellitus (HCC) 02/25/2022   Diverticulitis    Frailty 03/04/2019   H/O bilateral breast reduction surgery    Hyperlipidemia    a. intolerant to statins.    Hypertension     Past Surgical History:  Procedure Laterality Date   ABDOMINAL HYSTERECTOMY  2003   APPENDECTOMY     age 82   BIOPSY  04/10/2023   Procedure: BIOPSY;  Surgeon: Cinderella Deatrice FALCON, MD;  [facility];  Service: Endoscopy;;   BREAST REDUCTION SURGERY     age 40   COLON SURGERY Left 2011   Hemicolectomy due to diverticulitis   ESOPHAGOGASTRODUODENOSCOPY (EGD) WITH PROPOFOL  N/A 04/10/2023   Procedure: ESOPHAGOGASTRODUODENOSCOPY (EGD) WITH PROPOFOL ;  Surgeon: Cinderella Deatrice FALCON, MD;  [facility];  Service:  Endoscopy;  Laterality: N/A;   EYE SURGERY  03/30/2009   cataract   IR ANGIOGRAM SELECTIVE EACH ADDITIONAL VESSEL  04/15/2024   IR ANGIOGRAM VISCERAL SELECTIVE  04/11/2023   IR INTRAVASCULAR ULTRASOUND NON CORONARY  04/11/2023   IR RADIOLOGIST EVAL & MGMT  04/12/2024   IR TRANSCATH PLC STENT 1ST ART NOT LE CV CAR VERT CAR  04/11/2023   IR TRANSCATH PLC STENT 1ST ART NOT LE CV CAR VERT CAR  04/15/2024   IR US  GUIDE BX ASP/DRAIN  04/15/2024   IR US  GUIDE BX ASP/DRAIN  04/17/2024   IR US  GUIDE BX ASP/DRAIN  04/17/2024   IR US  GUIDE VASC ACCESS LEFT  04/15/2024   IR US  GUIDE VASC ACCESS RIGHT  04/11/2023   KNEE SURGERY Right 2003   knee cap   LAPAROSCOPIC INCISIONAL / UMBILICAL / VENTRAL HERNIA REPAIR  02/23/2007   REDUCTION MAMMAPLASTY Bilateral 2001   REFRACTIVE SURGERY     TOTAL KNEE ARTHROPLASTY Right 04/10/2022   Procedure: TOTAL KNEE ARTHROPLASTY;  Surgeon: Gerome Charleston, MD;  Location: [facility];  Service: Orthopedics;  Laterality: Right;  adductor canal    Home Medications:  (Not in a hospital admission)   Allergies:  Allergies  Allergen Reactions   Statins Other (See Comments)    Severe myalgias   Phenergan [Promethazine Hcl] Other (See Comments)    Hallucinations    Hydrocortisone Itching   Lisinopril Other (See Comments)    Lethargy, Fatigue   Prednisone  Rash   Trazodone  And Nefazodone Cough  Family History  Problem Relation Age of Onset   Cancer Mother        ovaian   Hypertension Father        kidney   Stroke Father    Cancer Father    Hypertension Sister    Diabetes Brother     Social History:  reports that she has never smoked. She has never used smokeless tobacco. She reports that she does not drink alcohol  and does not use drugs.  ROS: A complete review of systems was performed.  All systems are negative except for pertinent findings as noted.  Physical Exam:  Vital signs in last 24 hours: @VSRANGES @ General:  Alert and oriented, No acute  distress HEENT: Normocephalic, atraumatic Neck: No JVD or lymphadenopathy Cardiovascular: Regular rate  Lungs: Normal inspiratory/expiratory excursion Extremities: No edema Neurologic: Grossly intact   I have reviewed notes from referring/previous physicians--ER/hospital admission records reviewed  I have reviewed prior urinalysis results  I have independently reviewed prior imaging--CT scan from recent hospitalization revealed a very large bladder.  No renal calculi.  No hydronephrosis.  I have reviewed prior urine culture  Bladder scan volume reviewed

## 2024-05-18 ENCOUNTER — Ambulatory Visit (INDEPENDENT_AMBULATORY_CARE_PROVIDER_SITE_OTHER): Admitting: Urology

## 2024-05-18 ENCOUNTER — Telehealth: Payer: Self-pay | Admitting: Family Medicine

## 2024-05-18 VITALS — BP 180/70 | HR 94

## 2024-05-18 DIAGNOSIS — N390 Urinary tract infection, site not specified: Secondary | ICD-10-CM

## 2024-05-18 DIAGNOSIS — R339 Retention of urine, unspecified: Secondary | ICD-10-CM | POA: Diagnosis not present

## 2024-05-18 DIAGNOSIS — R278 Other lack of coordination: Secondary | ICD-10-CM | POA: Diagnosis not present

## 2024-05-18 DIAGNOSIS — N3000 Acute cystitis without hematuria: Secondary | ICD-10-CM

## 2024-05-18 DIAGNOSIS — M6281 Muscle weakness (generalized): Secondary | ICD-10-CM | POA: Diagnosis not present

## 2024-05-18 DIAGNOSIS — Z87440 Personal history of urinary (tract) infections: Secondary | ICD-10-CM

## 2024-05-18 DIAGNOSIS — R350 Frequency of micturition: Secondary | ICD-10-CM

## 2024-05-18 LAB — MICROSCOPIC EXAMINATION

## 2024-05-18 LAB — URINALYSIS, ROUTINE W REFLEX MICROSCOPIC
Bilirubin, UA: NEGATIVE
Glucose, UA: NEGATIVE
Ketones, UA: NEGATIVE
Nitrite, UA: NEGATIVE
RBC, UA: NEGATIVE
Specific Gravity, UA: 1.015 (ref 1.005–1.030)
Urobilinogen, Ur: 0.2 mg/dL (ref 0.2–1.0)
pH, UA: 6 (ref 5.0–7.5)

## 2024-05-18 LAB — BLADDER SCAN AMB NON-IMAGING: Scan Result: 135

## 2024-05-18 NOTE — Telephone Encounter (Signed)
 Patient son-Jason-call me on my cell number regarding some sort of billing issue  Front staff-please call the son find out what type of concern question or issue he has either please try to handle it or point him in the direction of someone who can handle this thank you

## 2024-05-18 NOTE — Progress Notes (Signed)
 Bladder Scan completed today due to reason of incomplete bladder emptying   Patient can void prior to the bladder scan. Bladder scan result: 135  Performed By: Exie DASEN. CMA  Additional notes- Patient is scheduled to follow up with MD

## 2024-05-20 ENCOUNTER — Telehealth: Payer: Self-pay | Admitting: *Deleted

## 2024-05-20 DIAGNOSIS — F419 Anxiety disorder, unspecified: Secondary | ICD-10-CM | POA: Diagnosis not present

## 2024-05-20 DIAGNOSIS — F331 Major depressive disorder, recurrent, moderate: Secondary | ICD-10-CM | POA: Diagnosis not present

## 2024-05-20 DIAGNOSIS — F5101 Primary insomnia: Secondary | ICD-10-CM | POA: Diagnosis not present

## 2024-05-20 MED ORDER — VITAMIN D3 25 MCG PO TABS: 1000.0000 [IU] | ORAL_TABLET | Freq: Every day | ORAL | 5 refills | Status: AC

## 2024-05-20 NOTE — Telephone Encounter (Signed)
 Copied from CRM #[id number]. Topic: Clinical - Medication Question >> May 20, 2024 11:35 AM [name] wrote: Vernell from [facility] calling as they still have not received a updated med list for the pt. Please fax an updated med list to them and include the following information that Dr. Kelsie replied with. Fax; [phone]     [name], MD to [facility]    05/14/24  7:31 PM Note Trying to renew the vitamin D  for 6 refills   Alprazolam  we are keeping at 0.25 mg each evening I just sent in a prescription for that and wrote it on the orders forms that they brought from [facility] Also I wrote on there to stop lactulose because that was on her med list from the nursing home thanks

## 2024-05-21 ENCOUNTER — Telehealth: Payer: Self-pay | Admitting: Family Medicine

## 2024-05-21 NOTE — Telephone Encounter (Signed)
 Copied from CRM #[id number]. Topic: Clinical - Medication Question >> May 20, 2024 11:35 AM [name] wrote: Vernell from [facility] calling as they still have not received a updated med list for the pt. Please fax an updated med list to them and include the following information that Dr. Kelsie replied with. Fax; [phone]     [name], MD to [facility]    05/14/24  7:31 PM Note Trying to renew the vitamin D  for 6 refills   Alprazolam  we are keeping at 0.25 mg each evening I just sent in a prescription for that and wrote it on the orders forms that they brought from [facility] Also I wrote on there to stop lactulose because that was on her med list from the nursing home thanks >> May 21, 2024 11:08 AM [name] wrote: Vernell from [facility] is following up on this request

## 2024-05-21 NOTE — Progress Notes (Shared)
 Referring Physician(s): Established patient   Chief Complaint: The patient is seen in follow up today s/p SMA stent placement 04/11/23 and celiac artery stent placement 04/15/24  History of present illness:  [name], 89 year old female, has a medical history significant for HTN, DM, diverticulitis, colonic perforation s/p hemicolectomy and chronic abdominal pain. She had numerous ED visits in 2024 for a variety of complaints including shortness of breath, intermittent diarrhea, weakness, poor appetite and unintentional weight loss. 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   She was hospitalized 04/07/2023-04/15/2023 and underwent further work up including an EGD. This showed no significant findings and the cause of her abdominal pain was hypothesized to be mesenteric ischemia. Interventional Radiology was consulted and on 04/11/23 she underwent a mesenteric angiogram with stent placement to the proximal SMA. She experienced some improvement in her abdominal pain post-procedure.   She was discharged from the hospital 04/15/2023 but returned to the ED many times in 2025 for numerous issues including hypertension, generalized weakness, chest pain, the flu, UTIs, etc. On 03/26/24 she was hospitalized for abdominal pain, nausea and poor appetite. 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   Interventional Radiology was consulted and our team was not convinced her abdominal pain was related to mesenteric ischemia. IR offered to treat the pseudoaneurysm with a thrombin  injection however the patient met with the Palliative team and declined to pursue any procedures that were not expected to directly improve her abdominal pain.   She was discharged from the hospital 04/02/24 and followed up  with me 04/12/24. She was in severe abdominal pain and had been unable to eat due to the pain. I believed it was reasonable to consider celiac artery stenting due to the severity of her pain and absence of other etiologies. She was advised to go to [facility] ED for admission and on 04/15/24 I performed celiac artery stent placement with right CFA pseudoaneurysm thrombin  injection. This procedure was performed via left CFA and post-procedure was discovered to have developed a new left pseudoaneurysm. On 04/17/24 she received bilateral femoral thrombin  injections.    She presented [facility] at [facility]/25/24 with abdominal pain and diarrhea. A CTA abdomen/pelvis showed patent celiac and SMA stents. She was discharged from the ED after receiving supportive care.   In the outpatient setting she was evaluated by Vascular Surgery on 05/05/24 and she denied abdominal pain. She reported eating without limitation. Dr. Sheree was unable to palpate any residual pseudoaneurysm and he recommended she follow up with IR.    The patient presents to the IR outpatient clinic today for follow up.   Past Medical History:  Diagnosis Date   Blood transfusion without reported diagnosis    Cognitive dysfunction 03/04/2019   Patient scores 21 out of 30 on [place] cognitive assessment September 2020   Diabetes mellitus without complication (HCC)    Diabetic peripheral neuropathy associated with type 2 diabetes mellitus (HCC) 02/25/2022   Diverticulitis    Frailty 03/04/2019   H/O bilateral breast reduction surgery    Hyperlipidemia    a. intolerant to statins.    Hypertension     Past Surgical History:  Procedure Laterality Date   ABDOMINAL HYSTERECTOMY  2003   APPENDECTOMY     age 73   BIOPSY  04/10/2023  Procedure: BIOPSY;  Surgeon: Cinderella Deatrice FALCON, MD;  Location: [facility];  Service: Endoscopy;;   BREAST REDUCTION SURGERY     age 46   COLON SURGERY Left 2011   Hemicolectomy due to diverticulitis    ESOPHAGOGASTRODUODENOSCOPY (EGD) WITH PROPOFOL  N/A 04/10/2023   Procedure: ESOPHAGOGASTRODUODENOSCOPY (EGD) WITH PROPOFOL ;  Surgeon: Cinderella Deatrice FALCON, MD;  Location: [facility];  Service: Endoscopy;  Laterality: N/A;   EYE SURGERY  03/30/2009   cataract   IR ANGIOGRAM SELECTIVE EACH ADDITIONAL VESSEL  04/15/2024   IR ANGIOGRAM VISCERAL SELECTIVE  04/11/2023   IR INTRAVASCULAR ULTRASOUND NON CORONARY  04/11/2023   IR RADIOLOGIST EVAL & MGMT  04/12/2024   IR TRANSCATH PLC STENT 1ST ART NOT LE CV CAR VERT CAR  04/11/2023   IR TRANSCATH PLC STENT 1ST ART NOT LE CV CAR VERT CAR  04/15/2024   IR US  GUIDE BX ASP/DRAIN  04/15/2024   IR US  GUIDE BX ASP/DRAIN  04/17/2024   IR US  GUIDE BX ASP/DRAIN  04/17/2024   IR US  GUIDE VASC ACCESS LEFT  04/15/2024   IR US  GUIDE VASC ACCESS RIGHT  04/11/2023   KNEE SURGERY Right 2003   knee cap   LAPAROSCOPIC INCISIONAL / UMBILICAL / VENTRAL HERNIA REPAIR  02/23/2007   REDUCTION MAMMAPLASTY Bilateral 2001   REFRACTIVE SURGERY     TOTAL KNEE ARTHROPLASTY Right 04/10/2022   Procedure: TOTAL KNEE ARTHROPLASTY;  Surgeon: Gerome Charleston, MD;  Location: [facility];  Service: Orthopedics;  Laterality: Right;  adductor canal    Allergies: Statins, Phenergan [promethazine hcl], Hydrocortisone, Lisinopril, Prednisone , and Trazodone  and nefazodone  Medications: Prior to Admission medications   Medication Sig Start Date End Date Taking? Authorizing Provider  acetaminophen  (TYLENOL ) 325 MG tablet TAKE (2) TABLETS BY MOUTH EVERY SIX HOURS AS NEEDED FOR PAIN. MAX 3GM IN 24 HOURS. 07/28/23   [name], MD  ALPRAZolam  (XANAX ) 0.25 MG tablet Take 1 tablet (0.25 mg total) by mouth at bedtime. 05/14/24   [name], MD  amLODipine  (NORVASC ) 10 MG tablet Take 1 tablet (10 mg total) by mouth daily. 05/14/24   [name], MD  aspirin  (ASPIRIN  LOW DOSE) 81 MG chewable tablet CHEW (1) TABLET BY MOUTH ONCE DAILY. 09/06/23   [name], MD  BETA CAROTENE  PROVITAMIN A 25000 units capsule TAKE (1) CAPSULE BY MOUTH AT BEDTIME. 02/24/24   [name], MD  cyanocobalamin  1000 MCG tablet Take 1,000 mcg by mouth at bedtime.    [provider]  cycloSPORINE  (RESTASIS ) 0.05 % ophthalmic emulsion Place 1 drop into both eyes 2 (two) times daily. 05/05/24   Landy Barnie RAMAN, NP  feeding supplement (ENSURE PLUS HIGH PROTEIN) LIQD Take 237 mLs by mouth 2 (two) times daily between meals. 04/22/24   Rashid, Farhan, MD  gabapentin  (NEURONTIN ) 100 MG capsule Take 1 capsule (100 mg total) by mouth daily. 05/05/24   Landy Barnie RAMAN, NP  glucose blood (EASYMAX TEST) test strip CHECK BLOOD SUGAR ONCE DAILY.(CALL MD IF BS BELOW 60: OR IF BS ABOVE 400) 10/07/23   Luking, Glendia LABOR, MD  hydrALAZINE  (APRESOLINE ) 25 MG tablet Take 1 tablet (25 mg total) by mouth 3 (three) times daily. 05/14/24   Luking, Glendia LABOR, MD  IVIZIA DRY EYES 0.5 % SOLN Place 1 drop into both eyes 2 (two) times daily. 09/22/23   [provider]  lactobacillus (FLORANEX/LACTINEX) PACK Take 1 packet (1 g total) by mouth 3 (three) times daily with meals. 04/22/24  Rashid, Farhan, MD  Melatonin 10 MG TABS Take 10 mg by mouth at bedtime. 03/24/24   Grooms, Courtney, PA-C  Menthol , Topical Analgesic, (BIOFREEZE COOL THE PAIN) 4 % GEL Apply 1 Application topically 3 (three) times daily. 1 application; topical Special Instructions: apply to back of neck three times daily due to pain. Three Times A Day 09:00 AM, 02:00 PM, 09:00 PM    [provider]  metFORMIN  (GLUCOPHAGE ) 500 MG tablet TAKE (1) TABLET BY MOUTH IN THE MORNING & (1/2) TABLET (250MG ) BY MOUTH AT SUPPER 05/14/24   Luking, Glendia LABOR, MD  methenamine  (HIPREX ) 1 g tablet Take 1 tablet (1 g total) by mouth 2 (two) times daily with a meal. Most effective when taken with a daily Vitamin C supplement. 05/05/24   Landy Barnie RAMAN, NP  Multiple Vitamins-Minerals (PRESERVISION AREDS 2) CAPS Take 1 capsule by mouth in the morning and at  bedtime.    [provider]  ondansetron  (ZOFRAN ) 4 MG tablet Take 1 tablet (4 mg total) by mouth every 6 (six) hours as needed for nausea or vomiting. 05/05/24   Landy Barnie RAMAN, NP  pantoprazole  (PROTONIX ) 40 MG tablet Take 1 tablet (40 mg total) by mouth daily. 05/14/24   [name], MD  polyethylene glycol (MIRALAX  / GLYCOLAX ) 17 g packet Take 17 g by mouth daily as needed for moderate constipation. 04/22/24   Rashid, Farhan, MD  psyllium (METAMUCIL SMOOTH TEXTURE) 58.6 % powder One bid with water  stop lactulose 05/14/24   Luking, Glendia LABOR, MD  Safety Lancets 28G MISC CHECK BLOOD SUGAR ONCE DAILY. 01/21/24   [name], MD  sodium chloride  1 g tablet One qd 05/14/24   [name], MD  tamsulosin  (FLOMAX ) 0.4 MG CAPS capsule Take 1 capsule (0.4 mg total) by mouth daily. 05/14/24   [name], MD  vitamin D3 (CHOLECALCIFEROL ) 25 MCG tablet Take 1 tablet (1,000 Units total) by mouth daily. 05/20/24   [name], MD     Family History  Problem Relation Age of Onset   Cancer Mother        ovaian   Hypertension Father        kidney   Stroke Father    Cancer Father    Hypertension Sister    Diabetes Brother     Social History   Socioeconomic History   Marital status: Widowed    Spouse name: Not on file   Number of children: Not on file   Years of education: Not on file   Highest education level: Not on file  Occupational History   Not on file  Tobacco Use   Smoking status: Never   Smokeless tobacco: Never  Vaping Use   Vaping status: Never Used  Substance and Sexual Activity   Alcohol  use: No   Drug use: No   Sexual activity: Not Currently    Birth control/protection: Abstinence  Other Topics Concern   Not on file  Social History Narrative   Not on file   Social Drivers of Health   Financial Resource Strain: Low Risk  (09/06/2022)   Overall Financial Resource Strain (CARDIA)    Difficulty of Paying Living Expenses: Not hard at all  Food  Insecurity: No Food Insecurity (04/12/2024)   Hunger Vital Sign    Worried About Running Out of Food in the Last Year: Never true    Ran Out of Food in the Last Year: Never true  Transportation Needs: No Transportation Needs (  04/22/2024)   PRAPARE - Administrator, Civil Service (Medical): No    Lack of Transportation (Non-Medical): No  Physical Activity: Sufficiently Active (09/06/2022)   Exercise Vital Sign    Days of Exercise per Week: 5 days    Minutes of Exercise per Session: 30 min  Stress: No Stress Concern Present (09/06/2022)   [employer] of Occupational Health - Occupational Stress Questionnaire    Feeling of Stress : Not at all  Social Connections: Moderately Isolated (04/12/2024)   Social Connection and Isolation Panel    Frequency of Communication with Friends and Family: More than three times a week    Frequency of Social Gatherings with Friends and Family: More than three times a week    Attends Religious Services: 1 to 4 times per year    Active Member of [employer] or Organizations: No    Attends Banker Meetings: Never    Marital Status: Widowed     Vital Signs: There were no vitals taken for this visit.  Physical Exam  Imaging: No results found.  Labs:  CBC: Recent Labs    04/19/24 0305 04/20/24 0323 04/21/24 0322 04/25/24 1319 04/26/24 0800  WBC 7.9 7.8 8.2 8.3  --   HGB 9.3* 9.1* 9.0* 10.1* 10.2*  HCT 29.4* 28.4* 28.6* 31.7* 32.8*  PLT 221 219 240 362  --     COAGS: Recent Labs    03/28/24 1434  INR 1.0    BMP: Recent Labs    04/19/24 0305 04/20/24 0323 04/21/24 0322 04/25/24 1319  NA 138 136 136 137  K 4.8 4.1 4.4 4.2  CL 101 100 101 99  CO2 26 28 27 23   GLUCOSE 166* 158* 160* 135*  BUN 29* 23 25* 20  CALCIUM 9.2 8.9 9.0 9.8  CREATININE 1.04* 0.96 0.92 0.84  GFRNONAA 51* 56* 59* >60    LIVER FUNCTION TESTS: Recent Labs    04/01/24 0246 04/12/24 1120 04/13/24 0416 04/25/24 1319  BILITOT 0.9  1.1 1.3* 0.5  AST 15 17 19 16   ALT 13 11 8 9   ALKPHOS 64 72 57 94  PROT 5.9* 6.6 5.5* 7.3  ALBUMIN 3.2* 3.8 3.0* 3.8    Assessment and Plan:  89 year old female with a history of chronic mesenteric ischemia status post SMA stent placement 04/11/23 and celiac artery stent placement 04/15/24.   Electronically Signed: Warren JONELLE Dais 05/21/2024, 9:18 AM   I spent a total of 25 Minutes in face to face in clinical consultation, greater than 50% of which was counseling/coordinating care for abdominal pain.

## 2024-05-24 ENCOUNTER — Inpatient Hospital Stay: Admission: RE | Admit: 2024-05-24 | Source: Ambulatory Visit

## 2024-05-24 ENCOUNTER — Telehealth: Payer: Self-pay

## 2024-05-24 ENCOUNTER — Encounter: Payer: Self-pay | Admitting: Family Medicine

## 2024-05-24 DIAGNOSIS — R278 Other lack of coordination: Secondary | ICD-10-CM | POA: Diagnosis not present

## 2024-05-24 DIAGNOSIS — M6281 Muscle weakness (generalized): Secondary | ICD-10-CM | POA: Diagnosis not present

## 2024-05-24 NOTE — Telephone Encounter (Signed)
 Called [facility], call was disconnected , will try call again tomorrow.

## 2024-05-24 NOTE — Telephone Encounter (Signed)
 Nurses-I saw the patient on November 14.  Feel free to send a copy of the office visit from the 14th to [facility].  I also filled out forms with [facility]   That particular dictation to has several things within the assessment and plan that we did regarding her medication list.  I am not sure what else Rx care needs? If necessary [facility] can send over a current list of medications we will review it for accuracy then forwarded to [facility]

## 2024-05-25 DIAGNOSIS — M6281 Muscle weakness (generalized): Secondary | ICD-10-CM | POA: Diagnosis not present

## 2024-05-25 DIAGNOSIS — R278 Other lack of coordination: Secondary | ICD-10-CM | POA: Diagnosis not present

## 2024-05-25 NOTE — Telephone Encounter (Signed)
 Faxed over medication list and notes as indicated in doctor's notes. Confirmation fax received

## 2024-05-31 DIAGNOSIS — M6281 Muscle weakness (generalized): Secondary | ICD-10-CM | POA: Diagnosis not present

## 2024-05-31 DIAGNOSIS — R278 Other lack of coordination: Secondary | ICD-10-CM | POA: Diagnosis not present

## 2024-06-01 DIAGNOSIS — M6281 Muscle weakness (generalized): Secondary | ICD-10-CM | POA: Diagnosis not present

## 2024-06-01 DIAGNOSIS — R278 Other lack of coordination: Secondary | ICD-10-CM | POA: Diagnosis not present

## 2024-06-07 DIAGNOSIS — E1159 Type 2 diabetes mellitus with other circulatory complications: Secondary | ICD-10-CM | POA: Diagnosis not present

## 2024-06-07 DIAGNOSIS — I700 Atherosclerosis of aorta: Secondary | ICD-10-CM | POA: Diagnosis not present

## 2024-06-08 DIAGNOSIS — M6281 Muscle weakness (generalized): Secondary | ICD-10-CM | POA: Diagnosis not present

## 2024-06-08 DIAGNOSIS — R278 Other lack of coordination: Secondary | ICD-10-CM | POA: Diagnosis not present

## 2024-06-08 LAB — HEMOGLOBIN A1C
Est. average glucose Bld gHb Est-mCnc: 146 mg/dL
Hgb A1c MFr Bld: 6.7 % — ABNORMAL HIGH (ref 4.8–5.6)

## 2024-06-08 LAB — BASIC METABOLIC PANEL WITH GFR
BUN/Creatinine Ratio: 19 (ref 12–28)
BUN: 19 mg/dL (ref 10–36)
CO2: 25 mmol/L (ref 20–29)
Calcium: 9.4 mg/dL (ref 8.7–10.3)
Chloride: 101 mmol/L (ref 96–106)
Creatinine, Ser: 1.02 mg/dL — ABNORMAL HIGH (ref 0.57–1.00)
Glucose: 96 mg/dL (ref 70–99)
Potassium: 4.7 mmol/L (ref 3.5–5.2)
Sodium: 140 mmol/L (ref 134–144)
eGFR: 52 mL/min/1.73 — ABNORMAL LOW (ref >59)

## 2024-06-08 LAB — MICROALBUMIN / CREATININE URINE RATIO
Creatinine, Urine: 69.9 mg/dL
Microalb/Creat Ratio: 443 mg/g{creat} — ABNORMAL HIGH (ref 0–29)
Microalbumin, Urine: 309.9 ug/mL

## 2024-06-11 DIAGNOSIS — R278 Other lack of coordination: Secondary | ICD-10-CM | POA: Diagnosis not present

## 2024-06-11 DIAGNOSIS — M6281 Muscle weakness (generalized): Secondary | ICD-10-CM | POA: Diagnosis not present

## 2024-06-14 ENCOUNTER — Ambulatory Visit (INDEPENDENT_AMBULATORY_CARE_PROVIDER_SITE_OTHER): Admitting: Family Medicine

## 2024-06-14 DIAGNOSIS — I10 Essential (primary) hypertension: Secondary | ICD-10-CM | POA: Diagnosis not present

## 2024-06-14 MED ORDER — VALSARTAN 40 MG PO TABS: 40.0000 mg | ORAL_TABLET | Freq: Every day | ORAL | 3 refills | Status: DC

## 2024-06-14 MED ORDER — AMLODIPINE BESYLATE 5 MG PO TABS: 5.0000 mg | ORAL_TABLET | Freq: Every day | ORAL | 5 refills | Status: DC

## 2024-06-14 NOTE — Progress Notes (Signed)
° °  Subjective:    Patient ID: [name], female    DOB: [birth year], 89 y.o.   MRN: [id number]  HPI Patient here for follow-up Doing a good job taking her medicines Blood pressure significantly elevated Labs reviewed with patient History of high blood pressure History of diabetes Taking her medicines as directed No significant setbacks Lab work from 06/07/2024 shows increasing micro protein Creatinine stable for age A1c reasonable control   Review of Systems     Objective:   Physical Exam  General-in no acute distress Eyes-no discharge Lungs-respiratory rate normal, CTA CV-no murmurs,RRR Extremities skin warm dry no edema Neuro grossly normal Behavior normal, alert       Assessment & Plan:  Protein in urine Elevated blood pressure Hypertension Diabetes with A1c decent control Plan is to continue hydralazine  3 times daily Reduce amlodipine  to 5 mg a day Add valsartan  40 mg daily Follow closely Possibly will need to reduce hydralazine  if blood pressure drops significantly Check kidney function in 7 days ARB will help protect kidneys with proteinuria issue May continue metformin  for now but if creatinine significantly gets higher with age may have to back off on metformin  Follow-up blood pressure several weeks Follow-up office visit with me in 4 months

## 2024-06-15 DIAGNOSIS — R278 Other lack of coordination: Secondary | ICD-10-CM | POA: Diagnosis not present

## 2024-06-15 DIAGNOSIS — M6281 Muscle weakness (generalized): Secondary | ICD-10-CM | POA: Diagnosis not present

## 2024-06-21 ENCOUNTER — Other Ambulatory Visit: Payer: Self-pay | Admitting: Family Medicine

## 2024-06-21 DIAGNOSIS — E119 Type 2 diabetes mellitus without complications: Secondary | ICD-10-CM

## 2024-06-23 ENCOUNTER — Ambulatory Visit: Payer: Self-pay | Admitting: Family Medicine

## 2024-06-23 LAB — BASIC METABOLIC PANEL WITH GFR
BUN/Creatinine Ratio: 19 (ref 12–28)
BUN: 18 mg/dL (ref 10–36)
CO2: 24 mmol/L (ref 20–29)
Calcium: 9.8 mg/dL (ref 8.7–10.3)
Chloride: 97 mmol/L (ref 96–106)
Creatinine, Ser: 0.96 mg/dL (ref 0.57–1.00)
Glucose: 104 mg/dL — ABNORMAL HIGH (ref 70–99)
Potassium: 4.7 mmol/L (ref 3.5–5.2)
Sodium: 137 mmol/L (ref 134–144)
eGFR: 56 mL/min/1.73 — ABNORMAL LOW

## 2024-07-04 ENCOUNTER — Observation Stay (HOSPITAL_COMMUNITY)
Admission: EM | Admit: 2024-07-04 | Discharge: 2024-07-08 | Disposition: A | Discharge status: Skilled Nursing Facility | Source: Skilled Nursing Facility | Attending: Internal Medicine | Admitting: Internal Medicine

## 2024-07-04 ENCOUNTER — Emergency Department (HOSPITAL_COMMUNITY)

## 2024-07-04 ENCOUNTER — Other Ambulatory Visit: Payer: Self-pay

## 2024-07-04 ENCOUNTER — Encounter (HOSPITAL_COMMUNITY): Payer: Self-pay

## 2024-07-04 DIAGNOSIS — R531 Weakness: Secondary | ICD-10-CM | POA: Diagnosis present

## 2024-07-04 DIAGNOSIS — N390 Urinary tract infection, site not specified: Secondary | ICD-10-CM | POA: Diagnosis not present

## 2024-07-04 DIAGNOSIS — E119 Type 2 diabetes mellitus without complications: Secondary | ICD-10-CM | POA: Diagnosis not present

## 2024-07-04 DIAGNOSIS — Z79899 Other long term (current) drug therapy: Secondary | ICD-10-CM | POA: Insufficient documentation

## 2024-07-04 DIAGNOSIS — I10 Essential (primary) hypertension: Secondary | ICD-10-CM | POA: Insufficient documentation

## 2024-07-04 DIAGNOSIS — K219 Gastro-esophageal reflux disease without esophagitis: Secondary | ICD-10-CM | POA: Insufficient documentation

## 2024-07-04 DIAGNOSIS — Z96651 Presence of right artificial knee joint: Secondary | ICD-10-CM | POA: Insufficient documentation

## 2024-07-04 DIAGNOSIS — F411 Generalized anxiety disorder: Secondary | ICD-10-CM | POA: Diagnosis not present

## 2024-07-04 DIAGNOSIS — R109 Unspecified abdominal pain: Secondary | ICD-10-CM | POA: Diagnosis not present

## 2024-07-04 DIAGNOSIS — G629 Polyneuropathy, unspecified: Secondary | ICD-10-CM | POA: Diagnosis not present

## 2024-07-04 DIAGNOSIS — K921 Melena: Secondary | ICD-10-CM | POA: Diagnosis not present

## 2024-07-04 DIAGNOSIS — R1010 Upper abdominal pain, unspecified: Secondary | ICD-10-CM

## 2024-07-04 DIAGNOSIS — Z7982 Long term (current) use of aspirin: Secondary | ICD-10-CM | POA: Insufficient documentation

## 2024-07-04 DIAGNOSIS — N3000 Acute cystitis without hematuria: Principal | ICD-10-CM

## 2024-07-04 DIAGNOSIS — D649 Anemia, unspecified: Secondary | ICD-10-CM | POA: Diagnosis not present

## 2024-07-04 DIAGNOSIS — K5909 Other constipation: Secondary | ICD-10-CM

## 2024-07-04 DIAGNOSIS — N1831 Chronic kidney disease, stage 3a: Secondary | ICD-10-CM

## 2024-07-04 LAB — CBC WITH DIFFERENTIAL/PLATELET
Abs Immature Granulocytes: 0.03 K/uL (ref 0.00–0.07)
Basophils Absolute: 0 K/uL (ref 0.0–0.1)
Basophils Relative: 0 %
Eosinophils Absolute: 0.1 K/uL (ref 0.0–0.5)
Eosinophils Relative: 2 %
HCT: 32.9 % — ABNORMAL LOW (ref 36.0–46.0)
Hemoglobin: 10.4 g/dL — ABNORMAL LOW (ref 12.0–15.0)
Immature Granulocytes: 0 %
Lymphocytes Relative: 20 %
Lymphs Abs: 1.4 K/uL (ref 0.7–4.0)
MCH: 26.2 pg (ref 26.0–34.0)
MCHC: 31.6 g/dL (ref 30.0–36.0)
MCV: 82.9 fL (ref 80.0–100.0)
Monocytes Absolute: 0.6 K/uL (ref 0.1–1.0)
Monocytes Relative: 9 %
Neutro Abs: 4.8 K/uL (ref 1.7–7.7)
Neutrophils Relative %: 69 %
Platelets: 274 K/uL (ref 150–400)
RBC: 3.97 MIL/uL (ref 3.87–5.11)
RDW: 14.7 % (ref 11.5–15.5)
WBC: 7 K/uL (ref 4.0–10.5)
nRBC: 0 % (ref 0.0–0.2)

## 2024-07-04 LAB — COMPREHENSIVE METABOLIC PANEL WITH GFR
ALT: 14 U/L (ref 0–44)
AST: 26 U/L (ref 15–41)
Albumin: 4 g/dL (ref 3.5–5.0)
Alkaline Phosphatase: 94 U/L (ref 38–126)
Anion gap: 14 (ref 5–15)
BUN: 17 mg/dL (ref 8–23)
CO2: 23 mmol/L (ref 22–32)
Calcium: 9.2 mg/dL (ref 8.9–10.3)
Chloride: 99 mmol/L (ref 98–111)
Creatinine, Ser: 0.77 mg/dL (ref 0.44–1.00)
GFR, Estimated: >60 mL/min
Glucose, Bld: 116 mg/dL — ABNORMAL HIGH (ref 70–99)
Potassium: 4.2 mmol/L (ref 3.5–5.1)
Sodium: 136 mmol/L (ref 135–145)
Total Bilirubin: 0.5 mg/dL (ref 0.0–1.2)
Total Protein: 6.4 g/dL — ABNORMAL LOW (ref 6.5–8.1)

## 2024-07-04 LAB — URINALYSIS, W/ REFLEX TO CULTURE (INFECTION SUSPECTED)
Bilirubin Urine: NEGATIVE
Glucose, UA: NEGATIVE mg/dL
Hgb urine dipstick: NEGATIVE
Ketones, ur: NEGATIVE mg/dL
Nitrite: NEGATIVE
Protein, ur: NEGATIVE mg/dL
Specific Gravity, Urine: 1.004 — ABNORMAL LOW (ref 1.005–1.030)
WBC, UA: >50 WBC/hpf (ref 0–5)
pH: 7 (ref 5.0–8.0)

## 2024-07-04 LAB — POC OCCULT BLOOD, ED: Fecal Occult Bld: NEGATIVE

## 2024-07-04 LAB — TYPE AND SCREEN
ABO/RH(D): O POS
Antibody Screen: NEGATIVE

## 2024-07-04 LAB — LIPASE, BLOOD: Lipase: 14 U/L (ref 11–51)

## 2024-07-04 MED ORDER — AMLODIPINE BESYLATE 5 MG PO TABS
5.0000 mg | ORAL_TABLET | Freq: Every day | ORAL | Status: DC
  Administered 2024-07-04 – 2024-07-08 (×5): 5 mg via ORAL
  Filled 2024-07-04 (×5): qty 1

## 2024-07-04 MED ORDER — POLYETHYLENE GLYCOL 3350 17 G PO PACK
17.0000 g | PACK | Freq: Every day | ORAL | Status: DC | PRN
  Administered 2024-07-05 – 2024-07-06 (×2): 17 g via ORAL
  Filled 2024-07-04 (×2): qty 1

## 2024-07-04 MED ORDER — FLORANEX PO PACK
1.0000 g | PACK | Freq: Three times a day (TID) | ORAL | Status: DC
  Administered 2024-07-05 – 2024-07-08 (×11): 1 g via ORAL
  Filled 2024-07-04 (×11): qty 1

## 2024-07-04 MED ORDER — CYCLOSPORINE 0.05 % OP EMUL
1.0000 [drp] | Freq: Two times a day (BID) | OPHTHALMIC | Status: DC
  Administered 2024-07-04 – 2024-07-08 (×8): 1 [drp] via OPHTHALMIC
  Filled 2024-07-04 (×8): qty 30

## 2024-07-04 MED ORDER — PANTOPRAZOLE SODIUM 40 MG IV SOLR
40.0000 mg | Freq: Two times a day (BID) | INTRAVENOUS | Status: DC
  Administered 2024-07-04 – 2024-07-06 (×4): 40 mg via INTRAVENOUS
  Filled 2024-07-04 (×4): qty 10

## 2024-07-04 MED ORDER — MUSCLE RUB 10-15 % EX CREA
TOPICAL_CREAM | Freq: Three times a day (TID) | CUTANEOUS | Status: DC
  Filled 2024-07-04: qty 85

## 2024-07-04 MED ORDER — VITAMIN B-12 1000 MCG PO TABS
1000.0000 ug | ORAL_TABLET | Freq: Every day | ORAL | Status: DC
  Administered 2024-07-05 – 2024-07-07 (×3): 1000 ug via ORAL
  Filled 2024-07-04 (×4): qty 1

## 2024-07-04 MED ORDER — ONDANSETRON HCL 4 MG/2ML IJ SOLN
4.0000 mg | Freq: Once | INTRAMUSCULAR | Status: AC
  Administered 2024-07-04: 4 mg via INTRAVENOUS
  Filled 2024-07-04: qty 2

## 2024-07-04 MED ORDER — OSELTAMIVIR PHOSPHATE 30 MG PO CAPS
30.0000 mg | ORAL_CAPSULE | Freq: Two times a day (BID) | ORAL | Status: DC
  Administered 2024-07-05 – 2024-07-08 (×7): 30 mg via ORAL
  Filled 2024-07-04 (×8): qty 1

## 2024-07-04 MED ORDER — ASPIRIN 81 MG PO CHEW
81.0000 mg | CHEWABLE_TABLET | Freq: Every day | ORAL | Status: DC
  Administered 2024-07-05 – 2024-07-08 (×4): 81 mg via ORAL
  Filled 2024-07-04 (×4): qty 1

## 2024-07-04 MED ORDER — POLYVINYL ALCOHOL 1.4 % OP SOLN
1.0000 [drp] | Freq: Two times a day (BID) | OPHTHALMIC | Status: DC
  Administered 2024-07-04 – 2024-07-08 (×8): 1 [drp] via OPHTHALMIC
  Filled 2024-07-04 (×2): qty 15

## 2024-07-04 MED ORDER — IOHEXOL 350 MG/ML SOLN
100.0000 mL | Freq: Once | INTRAVENOUS | Status: AC | PRN
  Administered 2024-07-04: 100 mL via INTRAVENOUS

## 2024-07-04 MED ORDER — MELATONIN 3 MG PO TABS
6.0000 mg | ORAL_TABLET | Freq: Every evening | ORAL | Status: DC | PRN
  Administered 2024-07-06: 6 mg via ORAL
  Filled 2024-07-04: qty 2

## 2024-07-04 MED ORDER — ENSURE PLUS HIGH PROTEIN PO LIQD
237.0000 mL | Freq: Two times a day (BID) | ORAL | Status: DC
  Administered 2024-07-05 – 2024-07-08 (×7): 237 mL via ORAL

## 2024-07-04 MED ORDER — TAMSULOSIN HCL 0.4 MG PO CAPS
0.4000 mg | ORAL_CAPSULE | Freq: Every day | ORAL | Status: DC
  Administered 2024-07-05 – 2024-07-08 (×4): 0.4 mg via ORAL
  Filled 2024-07-04 (×5): qty 1

## 2024-07-04 MED ORDER — ENOXAPARIN SODIUM 40 MG/0.4ML IJ SOSY
40.0000 mg | PREFILLED_SYRINGE | INTRAMUSCULAR | Status: DC
  Administered 2024-07-04 – 2024-07-07 (×4): 40 mg via SUBCUTANEOUS
  Filled 2024-07-04 (×4): qty 0.4

## 2024-07-04 MED ORDER — MENTHOL (TOPICAL ANALGESIC) 4 % EX GEL: 1.0000 | Freq: Three times a day (TID) | CUTANEOUS | Status: DC

## 2024-07-04 MED ORDER — HYDROMORPHONE HCL 1 MG/ML IJ SOLN
0.5000 mg | Freq: Once | INTRAMUSCULAR | Status: AC
  Administered 2024-07-04: 0.5 mg via INTRAVENOUS
  Filled 2024-07-04: qty 0.5

## 2024-07-04 MED ORDER — ALPRAZOLAM 0.5 MG PO TABS
0.2500 mg | ORAL_TABLET | Freq: Every day | ORAL | Status: DC
  Administered 2024-07-04 – 2024-07-07 (×4): 0.25 mg via ORAL
  Filled 2024-07-04 (×4): qty 1

## 2024-07-04 MED ORDER — SODIUM CHLORIDE 0.9 % IV SOLN
1.0000 g | INTRAVENOUS | Status: DC
  Administered 2024-07-05 – 2024-07-06 (×2): 1 g via INTRAVENOUS
  Filled 2024-07-04 (×2): qty 10

## 2024-07-04 MED ORDER — ONDANSETRON HCL 4 MG/2ML IJ SOLN: 4.0000 mg | Freq: Four times a day (QID) | INTRAMUSCULAR | Status: DC | PRN

## 2024-07-04 MED ORDER — OSELTAMIVIR PHOSPHATE 75 MG PO CAPS
75.0000 mg | ORAL_CAPSULE | ORAL | Status: AC
  Administered 2024-07-04: 75 mg via ORAL
  Filled 2024-07-04: qty 1

## 2024-07-04 MED ORDER — PANTOPRAZOLE SODIUM 40 MG PO TBEC: 40.0000 mg | DELAYED_RELEASE_TABLET | Freq: Every day | ORAL | Status: DC

## 2024-07-04 MED ORDER — IRBESARTAN 75 MG PO TABS
37.5000 mg | ORAL_TABLET | Freq: Every day | ORAL | Status: DC
  Administered 2024-07-05 – 2024-07-08 (×4): 37.5 mg via ORAL
  Filled 2024-07-04 (×4): qty 1

## 2024-07-04 MED ORDER — GABAPENTIN 100 MG PO CAPS
100.0000 mg | ORAL_CAPSULE | Freq: Every day | ORAL | Status: DC
  Administered 2024-07-04 – 2024-07-07 (×4): 100 mg via ORAL
  Filled 2024-07-04 (×4): qty 1

## 2024-07-04 MED ORDER — POVIDONE (PF) 0.5 % OP SOLN: 1.0000 [drp] | Freq: Two times a day (BID) | OPHTHALMIC | Status: DC

## 2024-07-04 MED ORDER — LACTATED RINGERS IV SOLN: INTRAVENOUS | Status: AC

## 2024-07-04 MED ORDER — HYDRALAZINE HCL 25 MG PO TABS
25.0000 mg | ORAL_TABLET | Freq: Three times a day (TID) | ORAL | Status: DC
  Administered 2024-07-04 – 2024-07-08 (×11): 25 mg via ORAL
  Filled 2024-07-04 (×11): qty 1

## 2024-07-04 MED ORDER — ACETAMINOPHEN 500 MG PO TABS
500.0000 mg | ORAL_TABLET | Freq: Four times a day (QID) | ORAL | Status: DC | PRN
  Administered 2024-07-05 – 2024-07-06 (×2): 500 mg via ORAL
  Filled 2024-07-04 (×2): qty 1

## 2024-07-04 MED ORDER — SODIUM CHLORIDE 0.9 % IV SOLN
1.0000 g | Freq: Once | INTRAVENOUS | Status: AC
  Administered 2024-07-04: 1 g via INTRAVENOUS
  Filled 2024-07-04: qty 10

## 2024-07-04 NOTE — ED Triage Notes (Signed)
 Pt BIB [facility] for generalized weakness for 3 days. Pt has been having black stools and feeling like she was going to pass out. Hx of GI Bleed and anemia. Denies thinners.

## 2024-07-04 NOTE — ED Provider Notes (Signed)
 "  EMERGENCY DEPARTMENT AT [facility] Provider Note  CSN: [id number] Arrival date & time: 07/04/24 1530  Chief Complaint(s) Weakness  HPI [name] is a 89 y.o. female history of diabetes, hypertension, hyperlipidemia, mesenteric artery stenosis presenting with weakness.  Patient reports that she has been feeling very weak for the past 3 days.  Has had some black stools for about a week.  Today saw some blood in her stool.  Reports some nausea, no vomiting.  Reports some diffuse abdominal pain.  No falls or injury.  No fevers or chills, cough, runny nose or sore throat.  No chest pain or shortness of breath.  No urinary symptoms.  No diarrhea.   Past Medical History Past Medical History:  Diagnosis Date   Blood transfusion without reported diagnosis    Cognitive dysfunction 03/04/2019   Patient scores 21 out of 30 on [place] cognitive assessment September 2020   Diabetes mellitus without complication (HCC)    Diabetic peripheral neuropathy associated with type 2 diabetes mellitus (HCC) 02/25/2022   Diverticulitis    Frailty 03/04/2019   H/O bilateral breast reduction surgery    Hyperlipidemia    a. intolerant to statins.    Hypertension    Patient Active Problem List   Diagnosis Date Noted   Generalized weakness 07/04/2024   Papule of skin 04/27/2024   Mesenteric ischemia 04/12/2024   DNR (do not resuscitate) discussion 04/01/2024   Occlusive mesenteric ischemia 03/31/2024   Palliative care by specialist 03/29/2024   DNR (do not resuscitate) 03/29/2024   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   Skin rash 03/24/2024   Lesion of nose 03/24/2024   History of Clostridioides difficile colitis 02/18/2024   Overflow diarrhea 02/18/2024   Abnormal findings on diagnostic imaging of other specified body structures 02/18/2024   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/15/2024   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  01/15/2024   Recurrent UTI 01/15/2024   Type 2 diabetes mellitus with hyperglycemia (HCC) 12/28/2023   UTI (urinary tract infection) 12/27/2023   Frequency of urination 12/24/2023   Acute blood loss anemia 04/17/2023   Gastritis and gastroduodenitis 04/10/2023   Abdominal pain, chronic, epigastric 04/08/2023   Mesenteric artery stenosis 04/07/2023   Diabetic neuropathy (HCC) 11/30/2022   S/P total knee arthroplasty, right 04/12/2022   Hypertension associated with type 2 diabetes mellitus (HCC) 02/25/2022   Aortic atherosclerosis 02/25/2022   Chronic non-seasonal allergic rhinitis 02/25/2022   GERD (gastroesophageal reflux disease) 02/17/2022   Myalgia due to statin 02/15/2020   Cognitive dysfunction 03/04/2019   Generalized anxiety disorder 06/05/2018   Hyponatremia 03/08/2018   Osteoarthritis of right knee 12/02/2017   Hyperlipidemia associated with type 2 diabetes mellitus (HCC) 10/02/2016   Insomnia 10/02/2016   Essential hypertension 12/15/2014   Type 2 diabetes mellitus with atherosclerosis of aorta (HCC) 02/15/2011   Home Medication(s) Prior to Admission medications  Medication Sig Start Date End Date Taking? Authorizing Provider  acetaminophen  (TYLENOL ) 325 MG tablet TAKE (2) TABLETS BY MOUTH EVERY SIX HOURS AS NEEDED FOR PAIN. MAX 3GM IN 24 HOURS. 07/28/23   [name], MD  ALPRAZolam  (XANAX ) 0.25 MG tablet Take 1 tablet (0.25 mg total) by mouth at bedtime. 05/14/24   [name], MD  amLODipine  (NORVASC ) 5 MG tablet Take 1 tablet (5 mg total) by mouth daily. 06/14/24   [name], MD  aspirin  (ASPIRIN  LOW DOSE) 81 MG chewable tablet CHEW (1) TABLET BY MOUTH ONCE DAILY. 09/06/23  [name], MD  BETA CAROTENE PROVITAMIN A 25000 units capsule TAKE (1) CAPSULE BY MOUTH AT BEDTIME. 02/24/24   [name], MD  cyanocobalamin  1000 MCG tablet Take 1,000 mcg by mouth at bedtime.    [provider]  cycloSPORINE  (RESTASIS ) 0.05 % ophthalmic emulsion  Place 1 drop into both eyes 2 (two) times daily. 05/05/24   Landy Barnie RAMAN, NP  EASYMAX TEST test strip CHECK BLOOD SUGAR ONCE DAILY.(CALL MD IF BS BELOW 60: OR IF BS ABOVE 400) 06/21/24   Luking, Glendia LABOR, MD  feeding supplement (ENSURE PLUS HIGH PROTEIN) LIQD Take 237 mLs by mouth 2 (two) times daily between meals. 04/22/24   Rashid, Farhan, MD  gabapentin  (NEURONTIN ) 100 MG capsule Take 1 capsule (100 mg total) by mouth daily. 05/05/24   Landy Barnie RAMAN, NP  hydrALAZINE  (APRESOLINE ) 25 MG tablet Take 1 tablet (25 mg total) by mouth 3 (three) times daily. 05/14/24   Luking, Glendia LABOR, MD  IVIZIA DRY EYES 0.5 % SOLN Place 1 drop into both eyes 2 (two) times daily. 09/22/23   [provider]  lactobacillus (FLORANEX/LACTINEX) PACK Take 1 packet (1 g total) by mouth 3 (three) times daily with meals. 04/22/24   Rashid, Farhan, MD  Melatonin 10 MG TABS Take 10 mg by mouth at bedtime. 03/24/24   Grooms, Courtney, PA-C  Menthol , Topical Analgesic, (BIOFREEZE COOL THE PAIN) 4 % GEL Apply 1 Application topically 3 (three) times daily. 1 application; topical Special Instructions: apply to back of neck three times daily due to pain. Three Times A Day 09:00 AM, 02:00 PM, 09:00 PM    [provider]  metFORMIN  (GLUCOPHAGE ) 500 MG tablet TAKE (1) TABLET BY MOUTH IN THE MORNING & (1/2) TABLET (250MG ) BY MOUTH AT SUPPER 05/14/24   Luking, Glendia LABOR, MD  methenamine  (HIPREX ) 1 g tablet Take 1 tablet (1 g total) by mouth 2 (two) times daily with a meal. Most effective when taken with a daily Vitamin C supplement. 05/05/24   Landy Barnie RAMAN, NP  Multiple Vitamins-Minerals (PRESERVISION AREDS 2) CAPS Take 1 capsule by mouth in the morning and at bedtime.    [provider]  ondansetron  (ZOFRAN ) 4 MG tablet Take 1 tablet (4 mg total) by mouth every 6 (six) hours as needed for nausea or vomiting. 05/05/24   Landy Barnie RAMAN, NP  pantoprazole  (PROTONIX ) 40 MG tablet Take 1 tablet (40 mg total) by mouth  daily. 05/14/24   [name], MD  polyethylene glycol (MIRALAX  / GLYCOLAX ) 17 g packet Take 17 g by mouth daily as needed for moderate constipation. 04/22/24   Rashid, Farhan, MD  psyllium (METAMUCIL SMOOTH TEXTURE) 58.6 % powder One bid with water  stop lactulose 05/14/24   Luking, Glendia LABOR, MD  Safety Lancets 28G MISC CHECK BLOOD SUGAR ONCE DAILY. 01/21/24   [name], MD  sodium chloride  1 g tablet One qd 05/14/24   [name], MD  tamsulosin  (FLOMAX ) 0.4 MG CAPS capsule Take 1 capsule (0.4 mg total) by mouth daily. 05/14/24   [name], MD  valsartan  (DIOVAN ) 40 MG tablet Take 1 tablet (40 mg total) by mouth daily. 06/14/24   [name], MD  vitamin D3 (CHOLECALCIFEROL ) 25 MCG tablet Take 1 tablet (1,000 Units total) by mouth daily. 05/20/24   [name], MD  Past Surgical History Past Surgical History:  Procedure Laterality Date   ABDOMINAL HYSTERECTOMY  2003   APPENDECTOMY     age 62   BIOPSY  04/10/2023   Procedure: BIOPSY;  Surgeon: Cinderella Deatrice FALCON, MD;  Location: [facility];  Service: Endoscopy;;   BREAST REDUCTION SURGERY     age 21   COLON SURGERY Left 2011   Hemicolectomy due to diverticulitis   ESOPHAGOGASTRODUODENOSCOPY (EGD) WITH PROPOFOL  N/A 04/10/2023   Procedure: ESOPHAGOGASTRODUODENOSCOPY (EGD) WITH PROPOFOL ;  Surgeon: Cinderella Deatrice FALCON, MD;  Location: [facility];  Service: Endoscopy;  Laterality: N/A;   EYE SURGERY  03/30/2009   cataract   IR ANGIOGRAM SELECTIVE EACH ADDITIONAL VESSEL  04/15/2024   IR ANGIOGRAM VISCERAL SELECTIVE  04/11/2023   IR INTRAVASCULAR ULTRASOUND NON CORONARY  04/11/2023   IR RADIOLOGIST EVAL & MGMT  04/12/2024   IR TRANSCATH PLC STENT 1ST ART NOT LE CV CAR VERT CAR  04/11/2023   IR TRANSCATH PLC STENT 1ST ART NOT LE CV CAR VERT CAR  04/15/2024   IR US  GUIDE BX ASP/DRAIN   04/15/2024   IR US  GUIDE BX ASP/DRAIN  04/17/2024   IR US  GUIDE BX ASP/DRAIN  04/17/2024   IR US  GUIDE VASC ACCESS LEFT  04/15/2024   IR US  GUIDE VASC ACCESS RIGHT  04/11/2023   KNEE SURGERY Right 2003   knee cap   LAPAROSCOPIC INCISIONAL / UMBILICAL / VENTRAL HERNIA REPAIR  02/23/2007   REDUCTION MAMMAPLASTY Bilateral 2001   REFRACTIVE SURGERY     TOTAL KNEE ARTHROPLASTY Right 04/10/2022   Procedure: TOTAL KNEE ARTHROPLASTY;  Surgeon: Gerome Charleston, MD;  Location: [facility];  Service: Orthopedics;  Laterality: Right;  adductor canal   Family History Family History  Problem Relation Age of Onset   Cancer Mother        ovaian   Hypertension Father        kidney   Stroke Father    Cancer Father    Hypertension Sister    Diabetes Brother     Social History Social History[1] Allergies Statins, Phenergan [promethazine hcl], Hydrocortisone, Lisinopril, Prednisone , and Trazodone  and nefazodone  Review of Systems Review of Systems  All other systems reviewed and are negative.   Physical Exam Vital Signs  I have reviewed the triage vital signs BP (!) 170/64   Pulse 77   Temp 98.4 F (36.9 C) (Oral)   Resp 18   Wt 77.1 kg   SpO2 96%   BMI 25.85 kg/m  Physical Exam Vitals and nursing note reviewed.  Constitutional:      General: She is not in acute distress.    Appearance: She is well-developed.  HENT:     Head: Normocephalic and atraumatic.     Mouth/Throat:     Mouth: Mucous membranes are moist.  Eyes:     Pupils: Pupils are equal, round, and reactive to light.  Cardiovascular:     Rate and Rhythm: Normal rate and regular rhythm.     Heart sounds: No murmur heard. Pulmonary:     Effort: Pulmonary effort is normal. No respiratory distress.     Breath sounds: Normal breath sounds.  Abdominal:     General: Abdomen is flat.     Palpations: Abdomen is soft.     Tenderness: There is abdominal tenderness (generalized).  Genitourinary:    Comments: Chaperoned by  RN, really no stool present in rectal vault Musculoskeletal:        General: No tenderness.  Right lower leg: No edema.     Left lower leg: No edema.  Skin:    General: Skin is warm and dry.  Neurological:     General: No focal deficit present.     Mental Status: She is alert. Mental status is at baseline.  Psychiatric:        Mood and Affect: Mood normal.        Behavior: Behavior normal.     ED Results and Treatments Labs (all labs ordered are listed, but only abnormal results are displayed) Labs Reviewed  COMPREHENSIVE METABOLIC PANEL WITH GFR - Abnormal; Notable for the following components:      Result Value   Glucose, Bld 116 (*)    Total Protein 6.4 (*)    All other components within normal limits  CBC WITH DIFFERENTIAL/PLATELET - Abnormal; Notable for the following components:   Hemoglobin 10.4 (*)    HCT 32.9 (*)    All other components within normal limits  URINALYSIS, W/ REFLEX TO CULTURE (INFECTION SUSPECTED) - Abnormal; Notable for the following components:   APPearance HAZY (*)    Specific Gravity, Urine 1.004 (*)    Leukocytes,Ua LARGE (*)    Bacteria, UA FEW (*)    All other components within normal limits  URINE CULTURE  LIPASE, BLOOD  POC OCCULT BLOOD, ED  TYPE AND SCREEN                                                                                                                          Radiology CT Angio Abd/Pel W and/or Wo Contrast Result Date: 07/04/2024 EXAM: CTA ABDOMEN AND PELVIS WITHOUT AND WITH CONTRAST 07/04/2024 05:40:16 PM TECHNIQUE: CTA images of the abdomen and pelvis without and with intravenous contrast. 100 mL (iohexol  (OMNIPAQUE ) 350 MG/ML injection 100 mL IOHEXOL  350 MG/ML SOLN) was administered. Three-dimensional MIP/volume rendered formations were performed. Automated exposure control, iterative reconstruction, and/or weight based adjustment of the mA/kV was utilized to reduce the radiation dose to as low as reasonably achievable.  COMPARISON: 04/25/2024 CLINICAL HISTORY: Lower GI bleed; Mesenteric ischemia, chronic. Generalized weakness for 3 days. Black stools. Negative occult fecal test. FINDINGS: VASCULATURE: AORTA: Diffuse calcification of the abdominal aorta. No acute finding. No abdominal aortic aneurysm. No dissection. CELIAC TRUNK: Stent in the celiac axis origin, which is patent. No dissection or critical occlusion identified. SUPERIOR MESENTERIC ARTERY: Stent in the superior mesenteric artery origin, which is patent. No dissection or critical occlusion identified. RENAL ARTERIES: No acute finding. No occlusion or significant stenosis. ILIAC ARTERIES: Diffuse calcification of the iliac arteries. No acute finding. No occlusion or significant stenosis. LIVER: Mild diffuse fatty infiltration of the liver. Likely hepatic cirrhosis within the large lateral segment of the left lobe and nodular liver contour. No focal lesions. No bile duct dilatation. GALLBLADDER AND BILE DUCTS: Cholelithiasis. No biliary ductal dilatation. SPLEEN: The spleen is unremarkable. PANCREAS: The pancreas is unremarkable. ADRENAL GLANDS: Bilateral adrenal glands demonstrate no acute abnormality. KIDNEYS, URETERS  AND BLADDER: Renal nephrograms are symmetrical. No stones in the kidneys or ureters. No hydronephrosis. No perinephric or periureteral stranding. Urinary bladder is unremarkable. GI AND BOWEL: Stomach, small bowel, and colon are not abnormally distended. No wall thickening or inflammatory infiltration are demonstrated. No mesenteric hematoma. No intraluminal contrast extravasation is identified. No focal site of active gastrointestinal hemorrhage is identified. REPRODUCTIVE: Reproductive organs are unremarkable. PERITONEUM AND RETRPERITONEUM: No ascites or free air. LUNG BASE: Lung bases are clear. Cardiac enlargement. LYMPH NODES: No lymphadenopathy. BONES AND SOFT TISSUES: Degenerative changes in the spine and hips. Postoperative mesh hernia repair of  the anterior abdominal wall. No acute abnormality of the bones. No acute soft tissue abnormality. IMPRESSION: 1. No evidence of active gastrointestinal hemorrhage or mesenteric ischemia. 2. Patent celiac axis and superior mesenteric artery stents. No dissection or critical occlusion identified. 3. Likely hepatic cirrhosis with nodular liver contour. No focal lesions or bile duct dilatation. Electronically signed by: Elsie Gravely MD 07/04/2024 05:52 PM EST RP Workstation: [id number]    Pertinent labs & imaging results that were available during my care of the patient were reviewed by me and considered in my medical decision making (see MDM for details).  Medications Ordered in ED Medications  cefTRIAXone  (ROCEPHIN ) 1 g in sodium chloride  0.9 % 100 mL IVPB (1 g Intravenous New Bag/Given 07/04/24 1923)  HYDROmorphone  (DILAUDID ) injection 0.5 mg (0.5 mg Intravenous Given 07/04/24 1634)  ondansetron  (ZOFRAN ) injection 4 mg (4 mg Intravenous Given 07/04/24 1634)  iohexol  (OMNIPAQUE ) 350 MG/ML injection 100 mL (100 mLs Intravenous Contrast Given 07/04/24 1733)  HYDROmorphone  (DILAUDID ) injection 0.5 mg (0.5 mg Intravenous Given 07/04/24 1921)                                                                                                                                     Procedures Procedures  (including critical care time)  Medical Decision Making / ED Course   MDM:  89 year old presenting to the emergency department with weakness.  Differential includes GI bleeding, intra-abdominal process such as infection or mesenteric ischemia, toxic or metabolic abnormality, symptomatic anemia, occult infectious process.  Will obtain CT angio abdomen pelvis given history of mesenteric artery stenosis.  Occult blood is negative but really not diagnostic as there is no stool in the rectal vault.  Will check labs as well including CBC.  Will check urinalysis.  Patient denies any URI symptoms to suggest any  pneumonia or other acute upper or lower respiratory infection.  Will reassess.  Clinical Course as of 07/04/24 1932  [place] Jul 04, 2024  8068 Workup with a stable hemoglobin.  CTA abdomen without acute process.  Urinalysis does show signs of possible UTI.  Patient feels weak and lives alone and does not feel safe going home so discussed with Dr. Pearlean who has admitted patient. [WS]    Clinical Course User Index [WS] Francesca Elsie CROME, MD  Additional history obtained: -Additional history obtained from ems -External records from outside source obtained and reviewed including: Chart review including previous notes, labs, imaging, consultation notes including prior notes    Lab Tests: -I ordered, reviewed, and interpreted labs.   The pertinent results include:   Labs Reviewed  COMPREHENSIVE METABOLIC PANEL WITH GFR - Abnormal; Notable for the following components:      Result Value   Glucose, Bld 116 (*)    Total Protein 6.4 (*)    All other components within normal limits  CBC WITH DIFFERENTIAL/PLATELET - Abnormal; Notable for the following components:   Hemoglobin 10.4 (*)    HCT 32.9 (*)    All other components within normal limits  URINALYSIS, W/ REFLEX TO CULTURE (INFECTION SUSPECTED) - Abnormal; Notable for the following components:   APPearance HAZY (*)    Specific Gravity, Urine 1.004 (*)    Leukocytes,Ua LARGE (*)    Bacteria, UA FEW (*)    All other components within normal limits  URINE CULTURE  LIPASE, BLOOD  POC OCCULT BLOOD, ED  TYPE AND SCREEN    Notable for UTI   EKG   EKG Interpretation Date/Time:    Ventricular Rate:    PR Interval:    QRS Duration:    QT Interval:    QTC Calculation:   R Axis:      Text Interpretation:           Imaging Studies ordered: I ordered imaging studies including CT abdomen On my interpretation imaging demonstrates no acute process I independently visualized and interpreted imaging. I agree with the  radiologist interpretation   Medicines ordered and prescription drug management: Meds ordered this encounter  Medications   HYDROmorphone  (DILAUDID ) injection 0.5 mg   ondansetron  (ZOFRAN ) injection 4 mg   iohexol  (OMNIPAQUE ) 350 MG/ML injection 100 mL   cefTRIAXone  (ROCEPHIN ) 1 g in sodium chloride  0.9 % 100 mL IVPB    Antibiotic Indication::   UTI   HYDROmorphone  (DILAUDID ) injection 0.5 mg    -I have reviewed the patients home medicines and have made adjustments as needed   Consultations Obtained: I requested consultation with the hospitalist,  and discussed lab and imaging findings as well as pertinent plan - they recommend: admission   Cardiac Monitoring: The patient was maintained on a cardiac monitor.  I personally viewed and interpreted the cardiac monitored which showed an underlying rhythm of: NSR  Social Determinants of Health:  Diagnosis or treatment significantly limited by social determinants of health: lives alone   Reevaluation: After the interventions noted above, I reevaluated the patient and found that their symptoms have improved  Co morbidities that complicate the patient evaluation  Past Medical History:  Diagnosis Date   Blood transfusion without reported diagnosis    Cognitive dysfunction 03/04/2019   Patient scores 21 out of 30 on [place] cognitive assessment September 2020   Diabetes mellitus without complication (HCC)    Diabetic peripheral neuropathy associated with type 2 diabetes mellitus (HCC) 02/25/2022   Diverticulitis    Frailty 03/04/2019   H/O bilateral breast reduction surgery    Hyperlipidemia    a. intolerant to statins.    Hypertension       Dispostion: Disposition decision including need for hospitalization was considered, and patient admitted to the hospital.    Final Clinical Impression(s) / ED Diagnoses Final diagnoses:  Acute cystitis without hematuria  Generalized weakness     This chart was dictated using  voice recognition software.  Despite  best efforts to proofread,  errors can occur which can change the documentation meaning.     [1]  Social History Tobacco Use   Smoking status: Never   Smokeless tobacco: Never  Vaping Use   Vaping status: Never Used  Substance Use Topics   Alcohol  use: No   Drug use: No     Francesca Elsie CROME, MD 07/04/24 1932  "

## 2024-07-04 NOTE — ED Notes (Signed)
 Pt ambulated to the BR with standby assistance

## 2024-07-04 NOTE — H&P (Signed)
 " History and Physical  [name] FMW:[id number] DOB: 21-May-1932 DOA: 07/04/2024  Referring physician: Dr. Francesca, EDP  PCP: [name], MD  Outpatient Specialists:  Patient coming from: Home, lives in assisted living facility.  Chief Complaint: Generalized weakness.  HPI: [name] is a 89 y.o. 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  Also endorses black stools, some bright red blood around her stool, and lightheadedness.  Denies use of NSAIDs.  Endorses exposure to influenza A at her assisted living facility.  No reported upper respiratory symptoms.  In the ER, afebrile with no leukocytosis, hypertensive.  FOBT negative.  Hemoglobin at baseline 10.4 with MCV of 82.  UA positive for pyuria.  The patient received IV Rocephin  1 g x 1 due to concern for possible UTI contributing to generalized weakness.  In the ER, the patient is too weak, she lives alone, and is too weak to go home on oral antibiotics.  Admitted by [facility], hospitalist service.  ED Course: Temperature 98.4.  BP 180/66, pulse 72, respiratory 18, O2 saturation 99% on room air.  Review of Systems: Review of systems as noted in the HPI. All other systems reviewed and are negative.   Past Medical History:  Diagnosis Date   Blood transfusion without reported diagnosis    Cognitive dysfunction 03/04/2019   Patient scores 21 out of 30 on [place] cognitive assessment September 2020   Diabetes mellitus without complication (HCC)    Diabetic peripheral neuropathy associated with type 2 diabetes mellitus (HCC) 02/25/2022   Diverticulitis    Frailty 03/04/2019   H/O bilateral breast reduction surgery    Hyperlipidemia    a. intolerant to statins.    Hypertension    Past  Surgical History:  Procedure Laterality Date   ABDOMINAL HYSTERECTOMY  2003   APPENDECTOMY     age 45   BIOPSY  04/10/2023   Procedure: BIOPSY;  Surgeon: Cinderella Deatrice FALCON, MD;  Location: [facility];  Service: Endoscopy;;   BREAST REDUCTION SURGERY     age 48   COLON SURGERY Left 2011   Hemicolectomy due to diverticulitis   ESOPHAGOGASTRODUODENOSCOPY (EGD) WITH PROPOFOL  N/A 04/10/2023   Procedure: ESOPHAGOGASTRODUODENOSCOPY (EGD) WITH PROPOFOL ;  Surgeon: Cinderella Deatrice FALCON, MD;  Location: [facility];  Service: Endoscopy;  Laterality: N/A;   EYE SURGERY  03/30/2009   cataract   IR ANGIOGRAM SELECTIVE EACH ADDITIONAL VESSEL  04/15/2024   IR ANGIOGRAM VISCERAL SELECTIVE  04/11/2023   IR INTRAVASCULAR ULTRASOUND NON CORONARY  04/11/2023   IR RADIOLOGIST EVAL & MGMT  04/12/2024   IR TRANSCATH PLC STENT 1ST ART NOT LE CV CAR VERT CAR  04/11/2023   IR TRANSCATH PLC STENT 1ST ART NOT LE CV CAR VERT CAR  04/15/2024   IR US  GUIDE BX ASP/DRAIN  04/15/2024   IR US  GUIDE BX ASP/DRAIN  04/17/2024   IR US  GUIDE BX ASP/DRAIN  04/17/2024   IR US  GUIDE VASC ACCESS LEFT  04/15/2024   IR US  GUIDE VASC ACCESS RIGHT  04/11/2023   KNEE SURGERY Right 2003   knee cap   LAPAROSCOPIC INCISIONAL / UMBILICAL / VENTRAL HERNIA REPAIR  02/23/2007   REDUCTION MAMMAPLASTY Bilateral 2001   REFRACTIVE SURGERY     TOTAL KNEE ARTHROPLASTY Right 04/10/2022   Procedure: TOTAL KNEE ARTHROPLASTY;  Surgeon: Gerome Charleston, MD;  Location: [facility];  Service: Orthopedics;  Laterality: Right;  adductor canal    Social History:  reports that she has never smoked. She has never used smokeless tobacco. She reports that she does not drink alcohol  and does not use drugs.   Allergies[1]  Family History  Problem Relation Age of Onset   Cancer Mother        ovaian   Hypertension Father        kidney   Stroke Father    Cancer Father    Hypertension Sister    Diabetes Brother       Prior to Admission medications   Medication Sig Start Date End Date Taking? Authorizing Provider  acetaminophen  (TYLENOL ) 325 MG tablet TAKE (2) TABLETS BY MOUTH EVERY SIX HOURS AS NEEDED FOR PAIN. MAX 3GM IN 24 HOURS. 07/28/23   [name], MD  ALPRAZolam  (XANAX ) 0.25 MG tablet Take 1 tablet (0.25 mg total) by mouth at bedtime. 05/14/24   [name], MD  amLODipine  (NORVASC ) 5 MG tablet Take 1 tablet (5 mg total) by mouth daily. 06/14/24   [name], MD  aspirin  (ASPIRIN  LOW DOSE) 81 MG chewable tablet CHEW (1) TABLET BY MOUTH ONCE DAILY. 09/06/23   [name], MD  BETA CAROTENE PROVITAMIN A 25000 units capsule TAKE (1) CAPSULE BY MOUTH AT BEDTIME. 02/24/24   [name], MD  cyanocobalamin  1000 MCG tablet Take 1,000 mcg by mouth at bedtime.    [provider]  cycloSPORINE  (RESTASIS ) 0.05 % ophthalmic emulsion Place 1 drop into both eyes 2 (two) times daily. 05/05/24   Landy Barnie RAMAN, NP  EASYMAX TEST test strip CHECK BLOOD SUGAR ONCE DAILY.(CALL MD IF BS BELOW 60: OR IF BS ABOVE 400) 06/21/24   Luking, Glendia LABOR, MD  feeding supplement (ENSURE PLUS HIGH PROTEIN) LIQD Take 237 mLs by mouth 2 (two) times daily between meals. 04/22/24   Rashid, Farhan, MD  gabapentin  (NEURONTIN ) 100 MG capsule Take 1 capsule (100 mg total) by mouth daily. 05/05/24   Landy Barnie RAMAN, NP  hydrALAZINE  (APRESOLINE ) 25 MG tablet Take 1 tablet (25 mg total) by mouth 3 (three) times daily. 05/14/24   Luking, Glendia LABOR, MD  IVIZIA DRY EYES 0.5 % SOLN Place 1 drop into both eyes 2 (two) times daily. 09/22/23   [provider]  lactobacillus (FLORANEX/LACTINEX) PACK Take 1 packet (1 g total) by mouth 3 (three) times daily with meals. 04/22/24   Rashid, Farhan, MD  Melatonin 10 MG TABS Take 10 mg by mouth at bedtime. 03/24/24   Grooms, Courtney, PA-C  Menthol , Topical Analgesic, (BIOFREEZE COOL THE PAIN) 4 % GEL Apply 1 Application topically 3 (three) times daily. 1 application; topical Special Instructions: apply to back of  neck three times daily due to pain. Three Times A Day 09:00 AM, 02:00 PM, 09:00 PM    [provider]  metFORMIN  (GLUCOPHAGE ) 500 MG tablet TAKE (1) TABLET BY MOUTH IN THE MORNING & (1/2) TABLET (250MG ) BY MOUTH AT SUPPER 05/14/24   Luking, Glendia LABOR, MD  methenamine  (HIPREX ) 1 g tablet Take 1 tablet (1 g total) by mouth 2 (two) times daily with a meal. Most effective when taken with a daily Vitamin C supplement. 05/05/24   Landy Barnie RAMAN, NP  Multiple Vitamins-Minerals (PRESERVISION AREDS 2) CAPS Take 1 capsule by mouth in the morning and at bedtime.    [provider]  ondansetron  (ZOFRAN ) 4 MG tablet Take  1 tablet (4 mg total) by mouth every 6 (six) hours as needed for nausea or vomiting. 05/05/24   Landy Barnie RAMAN, NP  pantoprazole  (PROTONIX ) 40 MG tablet Take 1 tablet (40 mg total) by mouth daily. 05/14/24   [name], MD  polyethylene glycol (MIRALAX  / GLYCOLAX ) 17 g packet Take 17 g by mouth daily as needed for moderate constipation. 04/22/24   Rashid, Farhan, MD  psyllium (METAMUCIL SMOOTH TEXTURE) 58.6 % powder One bid with water  stop lactulose 05/14/24   Luking, Glendia LABOR, MD  Safety Lancets 28G MISC CHECK BLOOD SUGAR ONCE DAILY. 01/21/24   [name], MD  sodium chloride  1 g tablet One qd 05/14/24   [name], MD  tamsulosin  (FLOMAX ) 0.4 MG CAPS capsule Take 1 capsule (0.4 mg total) by mouth daily. 05/14/24   [name], MD  valsartan  (DIOVAN ) 40 MG tablet Take 1 tablet (40 mg total) by mouth daily. 06/14/24   [name], MD  vitamin D3 (CHOLECALCIFEROL ) 25 MCG tablet Take 1 tablet (1,000 Units total) by mouth daily. 05/20/24   [name], MD    Physical Exam: BP (!) 170/64   Pulse 77   Temp 98.4 F (36.9 C) (Oral)   Resp 18   Wt 77.1 kg   SpO2 96%   BMI 25.85 kg/m   General: 89 y.o. year-old female well developed well nourished in no acute distress.  Alert and oriented x3. Cardiovascular: Regular rate and rhythm with no rubs or  gallops.  No thyromegaly or JVD noted.  No lower extremity edema. 2/4 pulses in all 4 extremities. Respiratory: Clear to auscultation with no wheezes or rales. Good inspiratory effort. Abdomen: Soft, epigastric tenderness with palpation.  Nondistended with normal bowel sounds x4 quadrants. Muskuloskeletal: No cyanosis, clubbing or edema noted bilaterally Neuro: CN II-XII intact, strength, sensation, reflexes Skin: No ulcerative lesions noted or rashes Psychiatry: Judgement and insight appear normal. Mood is appropriate for condition and setting          Labs on Admission:  Basic Metabolic Panel: Recent Labs  Lab 07/04/24 1630  NA 136  K 4.2  CL 99  CO2 23  GLUCOSE 116*  BUN 17  CREATININE 0.77  CALCIUM 9.2   Liver Function Tests: Recent Labs  Lab 07/04/24 1630  AST 26  ALT 14  ALKPHOS 94  BILITOT 0.5  PROT 6.4*  ALBUMIN 4.0   Recent Labs  Lab 07/04/24 1630  LIPASE 14   No results for input(s): AMMONIA in the last 168 hours. CBC: Recent Labs  Lab 07/04/24 1630  WBC 7.0  NEUTROABS 4.8  HGB 10.4*  HCT 32.9*  MCV 82.9  PLT 274   Cardiac Enzymes: No results for input(s): CKTOTAL, CKMB, CKMBINDEX, TROPONINI in the last 168 hours.  BNP (last 3 results) Recent Labs    07/24/23 1456 09/22/23 1848  BNP 88.0 88.0    ProBNP (last 3 results) No results for input(s): PROBNP in the last 8760 hours.  CBG: No results for input(s): GLUCAP in the last 168 hours.  Radiological Exams on Admission: CT Angio Abd/Pel W and/or Wo Contrast Result Date: 07/04/2024 EXAM: CTA ABDOMEN AND PELVIS WITHOUT AND WITH CONTRAST 07/04/2024 05:40:16 PM TECHNIQUE: CTA images of the abdomen and pelvis without and with intravenous contrast. 100 mL (iohexol  (OMNIPAQUE ) 350 MG/ML injection 100 mL IOHEXOL  350 MG/ML SOLN) was administered. Three-dimensional MIP/volume rendered formations were performed. Automated exposure control, iterative reconstruction, and/or weight based  adjustment of the  mA/kV was utilized to reduce the radiation dose to as low as reasonably achievable. COMPARISON: 04/25/2024 CLINICAL HISTORY: Lower GI bleed; Mesenteric ischemia, chronic. Generalized weakness for 3 days. Black stools. Negative occult fecal test. FINDINGS: VASCULATURE: AORTA: Diffuse calcification of the abdominal aorta. No acute finding. No abdominal aortic aneurysm. No dissection. CELIAC TRUNK: Stent in the celiac axis origin, which is patent. No dissection or critical occlusion identified. SUPERIOR MESENTERIC ARTERY: Stent in the superior mesenteric artery origin, which is patent. No dissection or critical occlusion identified. RENAL ARTERIES: No acute finding. No occlusion or significant stenosis. ILIAC ARTERIES: Diffuse calcification of the iliac arteries. No acute finding. No occlusion or significant stenosis. LIVER: Mild diffuse fatty infiltration of the liver. Likely hepatic cirrhosis within the large lateral segment of the left lobe and nodular liver contour. No focal lesions. No bile duct dilatation. GALLBLADDER AND BILE DUCTS: Cholelithiasis. No biliary ductal dilatation. SPLEEN: The spleen is unremarkable. PANCREAS: The pancreas is unremarkable. ADRENAL GLANDS: Bilateral adrenal glands demonstrate no acute abnormality. KIDNEYS, URETERS AND BLADDER: Renal nephrograms are symmetrical. No stones in the kidneys or ureters. No hydronephrosis. No perinephric or periureteral stranding. Urinary bladder is unremarkable. GI AND BOWEL: Stomach, small bowel, and colon are not abnormally distended. No wall thickening or inflammatory infiltration are demonstrated. No mesenteric hematoma. No intraluminal contrast extravasation is identified. No focal site of active gastrointestinal hemorrhage is identified. REPRODUCTIVE: Reproductive organs are unremarkable. PERITONEUM AND RETRPERITONEUM: No ascites or free air. LUNG BASE: Lung bases are clear. Cardiac enlargement. LYMPH NODES: No lymphadenopathy.  BONES AND SOFT TISSUES: Degenerative changes in the spine and hips. Postoperative mesh hernia repair of the anterior abdominal wall. No acute abnormality of the bones. No acute soft tissue abnormality. IMPRESSION: 1. No evidence of active gastrointestinal hemorrhage or mesenteric ischemia. 2. Patent celiac axis and superior mesenteric artery stents. No dissection or critical occlusion identified. 3. Likely hepatic cirrhosis with nodular liver contour. No focal lesions or bile duct dilatation. Electronically signed by: Elsie Gravely MD 07/04/2024 05:52 PM EST RP Workstation: [id number]    EKG: I independently viewed the EKG done and my findings are as followed: None available at the time of this visit.  Assessment/Plan Present on Admission: **None**  Active Problems:   Generalized weakness  Generalized weakness suspect contributed by UTI versus others, POA Treat underlying conditions IV Rocephin  Tamiflu  75 mg twice daily x 5 days, for post influenza A exposure Gentle IV fluid hydration PT OT evaluations Fall precautions.  Presumptive UTI, POA UA positive for pyuria Follow urine culture for ID and sensitivities Continue Rocephin , de-escalate IV antibiotics when able Monitor fever curve and WBCs  Melena with concern for possible upper GI bleed FOBT was negative in the ER, hemoglobin stable at 10.4 IV PPI, Protonix  40 mg twice daily Consider GI evaluation prior to discharge or close GI follow-up outpatient.  Post influenza A exposure, POA Exposure to influenza A at her assisted living facility States one of her aides had to leave early due to flulike symptoms. Tamiflu  75 mg twice daily x 5 days DuoNebs as needed and antitussives as needed  Essential hypertension BP is not at goal, elevated Resume home Norvasc , hydralazine , and irbesartan   History of incomplete bladder emptying Resume home Flomax  Monitor urine output  GERD IV PPI twice daily due to concern for upper GI  bleed.  Generalized anxiety Resume home Xanax   Prediabetes On metformin  prior to admission Blood sugars stable.  Polyneuropathy Resume home gabapentin   Time: 75 minutes   DVT prophylaxis:  Subcu Lovenox  daily.  Code Status: DNR/DNI  Family Communication: None at bedside  Disposition Plan: Admitted to telemetry unit  Consults called: None.  Admission status: Observation status.   Status is: Observation    Terry LOISE Hurst MD [facility] Pager [phone]  If 7PM-7AM, please contact night-coverage [email or web address] Password [email or web address]  07/04/2024, 7:50 PM      [1]  Allergies Allergen Reactions   Statins Other (See Comments)    Severe myalgias   Phenergan [Promethazine Hcl] Other (See Comments)    Hallucinations    Hydrocortisone Itching   Lisinopril Other (See Comments)    Lethargy, Fatigue   Prednisone  Rash   Trazodone  And Nefazodone Cough   "

## 2024-07-04 NOTE — ED Notes (Signed)
 Patient transported to CT

## 2024-07-05 ENCOUNTER — Ambulatory Visit

## 2024-07-05 ENCOUNTER — Telehealth: Payer: Self-pay | Admitting: *Deleted

## 2024-07-05 DIAGNOSIS — R531 Weakness: Secondary | ICD-10-CM

## 2024-07-05 DIAGNOSIS — D649 Anemia, unspecified: Secondary | ICD-10-CM

## 2024-07-05 DIAGNOSIS — N390 Urinary tract infection, site not specified: Secondary | ICD-10-CM

## 2024-07-05 LAB — VITAMIN B12: Vitamin B-12: 1137 pg/mL — ABNORMAL HIGH (ref 180–914)

## 2024-07-05 LAB — IRON AND TIBC
Iron: 36 ug/dL (ref 28–170)
Saturation Ratios: 10 % — ABNORMAL LOW (ref 10.4–31.8)
TIBC: 370 ug/dL (ref 250–450)
UIBC: 334 ug/dL

## 2024-07-05 LAB — RETICULOCYTES
Immature Retic Fract: 17.6 % — ABNORMAL HIGH (ref 2.3–15.9)
RBC.: 3.91 MIL/uL (ref 3.87–5.11)
Retic Count, Absolute: 50.8 K/uL (ref 19.0–186.0)
Retic Ct Pct: 1.3 % (ref 0.4–3.1)

## 2024-07-05 LAB — FOLATE: Folate: 14.3 ng/mL

## 2024-07-05 LAB — FERRITIN: Ferritin: 15 ng/mL (ref 11–307)

## 2024-07-05 MED ORDER — SENNOSIDES-DOCUSATE SODIUM 8.6-50 MG PO TABS
1.0000 | ORAL_TABLET | Freq: Two times a day (BID) | ORAL | Status: DC
  Administered 2024-07-05 – 2024-07-06 (×2): 1 via ORAL
  Filled 2024-07-05 (×2): qty 1

## 2024-07-05 MED ORDER — HYDROCODONE-ACETAMINOPHEN 5-325 MG PO TABS
1.0000 | ORAL_TABLET | ORAL | Status: DC | PRN
  Administered 2024-07-05: 1 via ORAL
  Administered 2024-07-05 – 2024-07-07 (×4): 2 via ORAL
  Filled 2024-07-05 (×2): qty 2
  Filled 2024-07-05: qty 1
  Filled 2024-07-05 (×2): qty 2

## 2024-07-05 NOTE — Evaluation (Signed)
 Occupational Therapy Evaluation Patient Details Name: [name] MRN: [id number] DOB: 30-Jun-1932 Today's Date: 07/05/2024   History of Present Illness   [name] is a 89 y.o. 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  Also endorses black stools, some bright red blood around her stool, and lightheadedness.  Denies use of NSAIDs.  Endorses exposure to influenza A at her assisted living facility.  No reported upper respiratory symptoms. (per DO)     Clinical Impressions Pt agreeable to OT and PT co-evaluation. Pt appears to be near functional baseline for ADL's without need of physical assist for most ADL's. Pt was also able to ambulate in the hall with the RW without physical assist. B UE are [facility] for strength and A/ROM. Pt left in the chair with call bell within reach. Pt will benefit from continued OT in the hospital to increase strength, balance, and endurance for safe ADL's.        If plan is discharge home, recommend the following:   Assist for transportation;A little help with bathing/dressing/bathroom     Functional Status Assessment   Patient has not had a recent decline in their functional status     Equipment Recommendations   None recommended by OT             Precautions/Restrictions   Precautions Precautions: Fall Recall of Precautions/Restrictions: Intact Restrictions Weight Bearing Restrictions Per Provider Order: No     Mobility Bed Mobility Overal bed mobility: Independent                  Transfers Overall transfer level: Needs assistance   Transfers: Sit to/from Stand, Bed to chair/wheelchair/BSC Sit to Stand: Supervision     Step pivot transfers: Supervision     General transfer  comment: EOB to chair without AD      Balance Overall balance assessment: Mild deficits observed, not formally tested                                         ADL either performed or assessed with clinical judgement   ADL Overall ADL's : Needs assistance/impaired     Grooming: Supervision/safety;Standing       Lower Body Bathing: Supervison/ safety;Sitting/lateral leans;Sit to/from stand           Toilet Transfer: Supervision/safety;Stand-pivot;Rolling walker (2 wheels) Toilet Transfer Details (indicate cue type and reason): EOB to chair without AD and ambulation in hall with RW. Toileting- Clothing Manipulation and Hygiene: Supervision/safety;Modified independent       Functional mobility during ADLs: Supervision/safety;Rolling walker (2 wheels)       Vision Baseline Vision/History: 1 Wears glasses Ability to See in Adequate Light: 0 Adequate Patient Visual Report: No change from baseline Vision Assessment?: No apparent visual deficits     Perception Perception: Not tested       Praxis Praxis: Not tested       Pertinent Vitals/Pain Pain Assessment Pain Assessment: No/denies pain     Extremity/Trunk Assessment Upper Extremity Assessment Upper Extremity Assessment: Overall WFL for tasks assessed   Lower Extremity Assessment Lower Extremity Assessment: Defer to PT evaluation   Cervical / Trunk Assessment Cervical / Trunk Assessment: Normal   Communication Communication  Communication: No apparent difficulties   Cognition Arousal: Alert Behavior During Therapy: WFL for tasks assessed/performed Cognition: No apparent impairments                               Following commands: Intact       Cueing  General Comments   Cueing Techniques: Verbal cues                 Home Living Family/patient expects to be discharged to:: Assisted living                             Home Equipment: Rolling Walker  (2 wheels)          Prior Functioning/Environment Prior Level of Function : Independent/Modified Independent;Needs assist       Physical Assist : ADLs (physical)   ADLs (physical): IADLs;Bathing Mobility Comments: Ambulates using RW. Denies fall history. ADLs Comments: Physically independent for most ADL's; pt reports supervision assist by staff for safety and assist to wash her back. IADL assist.                            Co-evaluation PT/OT/SLP Co-Evaluation/Treatment: Yes Reason for Co-Treatment: To address functional/ADL transfers   OT goals addressed during session: ADL's and self-care      AM-PAC OT 6 Clicks Daily Activity     Outcome Measure Help from another person eating meals?: None Help from another person taking care of personal grooming?: None Help from another person toileting, which includes using toliet, bedpan, or urinal?: None Help from another person bathing (including washing, rinsing, drying)?: A Little Help from another person to put on and taking off regular upper body clothing?: None Help from another person to put on and taking off regular lower body clothing?: None 6 Click Score: 23   End of Session Equipment Utilized During Treatment: Rolling walker (2 wheels)  Activity Tolerance: Patient tolerated treatment well Patient left: in chair;with call bell/phone within reach  OT Visit Diagnosis: Unsteadiness on feet (R26.81);Other abnormalities of gait and mobility (R26.89)                Time: 9170-9154 OT Time Calculation (min): 16 min Charges:  OT General Charges $OT Visit: 1 Visit OT Evaluation $OT Eval Low Complexity: 1 Low  Doctor Sheahan OT, MOT  Jayson Person 07/05/2024, 9:43 AM

## 2024-07-05 NOTE — TOC Initial Note (Signed)
 Transition of Care [facility]) - Initial/Assessment Note    Patient Details  Name: [name] MRN: [id number] Date of Birth: [birth year]  Transition of Care [facility]) CM/SW Contact:    Hoy DELENA Bigness, LCSW Phone Number: 07/05/2024, 12:01 PM  Clinical Narrative:                 Pt admitted from [facility] where she is a LTC resident. Pt ambulates with RW at baseline and requires some assistance for ADL's. Pt currently receiving OPPT through [facility] who come to [facility] to provide services. CSW spoke with Kyra at [facility] who confirmed pt is able to return at discharge. ICM will continue to follow.   Expected Discharge Plan: Assisted Living Barriers to Discharge: Continued Medical Work up   Patient Goals and CMS Choice Patient states their goals for this hospitalization and ongoing recovery are:: To return to [facility] CMS Medicare.gov Compare Post Acute Care list provided to:: Patient        Expected Discharge Plan and Services In-house Referral: Clinical Social Work Discharge Planning Services: NA Post Acute Care Choice: Resumption of Svcs/PTA Provider Living arrangements for the past 2 months: Assisted Living Facility                 DME Arranged: N/A DME Agency: NA                  Prior Living Arrangements/Services Living arrangements for the past 2 months: Assisted Living Facility Lives with:: Facility Resident Patient language and need for interpreter reviewed:: Yes Do you feel safe going back to the place where you live?: Yes      Need for Family Participation in Patient Care: No (Comment) Care giver support system in place?: Yes (comment) Current home services: DME (RW; receive OPPT at ALF) Criminal Activity/Legal Involvement Pertinent to Current Situation/Hospitalization: No - Comment as needed  Activities of Daily Living   ADL Screening (condition at time of admission) Independently performs ADLs?: Yes (appropriate for developmental age) Is  the patient deaf or have difficulty hearing?: No Does the patient have difficulty seeing, even when wearing glasses/contacts?: No Does the patient have difficulty concentrating, remembering, or making decisions?: No  Permission Sought/Granted Permission sought to share information with : Facility Medical Sales Representative, Family Supports Permission granted to share information with : Yes, Verbal Permission Granted  Share Information with NAME: Dresden, Ament, Emergency Contact  [phone]  Permission granted to share info w AGENCY: [facility]        Emotional Assessment Appearance:: Appears stated age Attitude/Demeanor/Rapport: Engaged Affect (typically observed): Accepting Orientation: : Oriented to Self, Oriented to Place, Oriented to  Time, Oriented to Situation Alcohol  / Substance Use: Not Applicable Psych Involvement: No (comment)  Admission diagnosis:  Acute cystitis without hematuria [N30.00] Generalized weakness [R53.1] Patient Active Problem List   Diagnosis Date Noted   Generalized weakness 07/04/2024   Papule of skin 04/27/2024   Mesenteric ischemia 04/12/2024   DNR (do not resuscitate) discussion 04/01/2024   Occlusive mesenteric ischemia 03/31/2024   Palliative care by specialist 03/29/2024   DNR (do not resuscitate) 03/29/2024   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   Skin rash 03/24/2024   Lesion of nose 03/24/2024   History of Clostridioides difficile colitis 02/18/2024   Overflow diarrhea 02/18/2024   Abnormal findings on diagnostic imaging of other specified body structures 02/18/2024   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/15/2024   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  01/15/2024   Recurrent UTI 01/15/2024   Type 2 diabetes mellitus with hyperglycemia (HCC) 12/28/2023   UTI (urinary tract infection) 12/27/2023   Frequency of urination 12/24/2023   Acute blood loss anemia 04/17/2023   Gastritis and  gastroduodenitis 04/10/2023   Abdominal pain, chronic, epigastric 04/08/2023   Mesenteric artery stenosis 04/07/2023   Diabetic neuropathy (HCC) 11/30/2022   S/P total knee arthroplasty, right 04/12/2022   Hypertension associated with type 2 diabetes mellitus (HCC) 02/25/2022   Aortic atherosclerosis 02/25/2022   Chronic non-seasonal allergic rhinitis 02/25/2022   GERD (gastroesophageal reflux disease) 02/17/2022   Myalgia due to statin 02/15/2020   Cognitive dysfunction 03/04/2019   Generalized anxiety disorder 06/05/2018   Hyponatremia 03/08/2018   Osteoarthritis of right knee 12/02/2017   Hyperlipidemia associated with type 2 diabetes mellitus (HCC) 10/02/2016   Insomnia 10/02/2016   Essential hypertension 12/15/2014   Type 2 diabetes mellitus with atherosclerosis of aorta (HCC) 02/15/2011   PCP:  [name], MD Pharmacy:   [facility] [place] - [address] [place] [address] Phone: [phone] Fax: [phone]  [facility] - [place], [place] - [address] [place] [address] Phone: [phone] Fax: [phone]     Social Drivers of Health (SDOH) Social History: SDOH Screenings   Food Insecurity: No Food Insecurity (07/04/2024)  Housing: Low Risk (07/04/2024)  Transportation Needs: No Transportation Needs (07/04/2024)  Utilities: Not At Risk (07/04/2024)  Alcohol  Screen: Low Risk (09/06/2022)  Depression (PHQ2-9): Low Risk (03/03/2024)  Financial Resource Strain: Low Risk (09/06/2022)  Physical Activity: Sufficiently Active (09/06/2022)  Social Connections: Moderately Isolated (07/04/2024)  Stress: No Stress Concern Present (09/06/2022)  Tobacco Use: Low Risk (07/04/2024)   SDOH Interventions:     Readmission Risk Interventions    12/28/2023    5:41 PM 09/15/2023    3:04 PM 04/08/2023    2:11 PM  Readmission Risk Prevention Plan  Transportation Screening Complete Complete Complete  PCP or Specialist Appt  within 3-5 Days   Not Complete  HRI or Home Care Consult  Complete Complete  Social Work Consult for Recovery Care Planning/Counseling  Complete Complete  Palliative Care Screening  Not Applicable Not Applicable  Medication Review Oceanographer) Complete Complete Complete  PCP or Specialist appointment within 3-5 days of discharge Complete    HRI or Home Care Consult Complete    SW Recovery Care/Counseling Consult Complete    Palliative Care Screening Not Applicable    Skilled Nursing Facility Not Applicable

## 2024-07-05 NOTE — Telephone Encounter (Unsigned)
 Copied from CRM [id number]. Topic: Appointments - Scheduling Inquiry for Clinic >> Jul 05, 2024 10:12 AM Antwanette L wrote: Reason for CRM: Nikki from [facility] is calling to cancel the patient's appointment. The patient is currently at [facility].

## 2024-07-05 NOTE — Plan of Care (Signed)

## 2024-07-05 NOTE — Care Management Obs Status (Signed)
 MEDICARE OBSERVATION STATUS NOTIFICATION   Patient Details  Name: [name] MRN: [id number] Date of Birth: March 28, 1932   Medicare Observation Status Notification Given:  Yes    Duwaine LITTIE Ada 07/05/2024, 5:19 PM

## 2024-07-05 NOTE — Progress Notes (Signed)
 " PROGRESS NOTE   [name]  FMW:[id number] DOB: 10-16-1931 DOA: 07/04/2024 PCP: [name], MD   Chief Complaint  Patient presents with   Weakness   Level of care: Telemetry  Brief Admission History:  89 y.o. 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  Also endorses black stools, some bright red blood around her stool, and lightheadedness.  Denies use of NSAIDs.  Endorses exposure to influenza A at her assisted living facility.  No reported upper respiratory symptoms.   In the ER, afebrile with no leukocytosis, hypertensive.  FOBT negative.  Hemoglobin at baseline 10.4 with MCV of 82.  UA positive for pyuria.   The patient received IV Rocephin  1 g x 1 due to concern for possible UTI contributing to generalized weakness.  In the ER, the patient is too weak, she lives alone, and is too weak to go home on oral antibiotics.  Admitted by [facility], hospitalist service.   ED Course: Temperature 98.4.  BP 180/66, pulse 72, respiratory 18, O2 saturation 99% on room air.   Assessment and Plan:  Generalized weakness  Treating presumed UTI IV Rocephin  ordered pending culture and sensitivity results Tamiflu  30 mg twice daily x 5 days, for post influenza A exposure Gentle IV fluid hydration PT/OT evaluations Fall precautions.   Presumptive UTI, POA UA positive for pyuria Follow urine culture for ID and sensitivities Continue Rocephin , de-escalate IV antibiotics when able Monitor fever curve and WBCs   Possible Melenotic stool Normocytic anemia  FOBT was negative in the ER, hemoglobin stable at 10.4 IV PPI, Protonix  40 mg twice daily Hemoccult stools ordered  CBC in AM  Anemia panel pending   Post influenza A exposure, POA Exposure to influenza A  at her assisted living facility States one of her aides had to leave early due to flulike symptoms. Tamiflu  30 mg twice daily x 5 days for prophylaxis  DuoNebs as needed and antitussives as needed   Essential hypertension BP is not at goal, elevated Resume home Norvasc , hydralazine , and irbesartan    History of incomplete bladder emptying Resume home Flomax  Monitor urine output   GERD IV PPI twice daily due to concern for upper GI bleed.   Generalized anxiety Resume home Xanax    Prediabetes On metformin  prior to admission   Polyneuropathy Resume home gabapentin   DVT prophylaxis: enoxaparin  Code Status: DNR  Family Communication:  Disposition: possible DC home tomorrow if stable and improved   Consultants:   Procedures:   Antimicrobials:    Subjective: Pt says she has not been feeling well.   Objective: Vitals:   07/05/24 0107 07/05/24 0500 07/05/24 0900 07/05/24 1240  BP: 130/60 (!) 143/42 (!) 181/58 (!) 152/44  Pulse:  66 66 70  Resp:  18    Temp:  98.2 F (36.8 C) 97.6 F (36.4 C) 97.9 F (36.6 C)  TempSrc:  Oral Oral Oral  SpO2:  95% 96% 93%  Weight:      Height:        Intake/Output Summary (Last 24 hours) at 07/05/2024 1308 Last data filed at 07/04/2024 2123 Gross per 24 hour  Intake 240 ml  Output --  Net 240 ml   Filed Weights   07/04/24 1539 07/04/24 2049  Weight: 77.1 kg 77.4 kg   Examination:  General exam:  Appears calm and comfortable  Respiratory system: Clear to auscultation. Respiratory effort normal. Cardiovascular system: normal S1 & S2 heard. No JVD, murmurs, rubs, gallops or clicks. No pedal edema. Gastrointestinal system: Abdomen is nondistended, soft and nontender. No organomegaly or masses felt. Normal bowel sounds heard. Central nervous system: Alert and oriented. No focal neurological deficits. Extremities: Symmetric 5 x 5 power. Skin: No rashes, lesions or ulcers. Psychiatry: Judgement and insight appear normal. Mood &  affect appropriate.   Data Reviewed: I have personally reviewed following labs and imaging studies  CBC: Recent Labs  Lab 07/04/24 1630  WBC 7.0  NEUTROABS 4.8  HGB 10.4*  HCT 32.9*  MCV 82.9  PLT 274    Basic Metabolic Panel: Recent Labs  Lab 07/04/24 1630  NA 136  K 4.2  CL 99  CO2 23  GLUCOSE 116*  BUN 17  CREATININE 0.77  CALCIUM 9.2    CBG: No results for input(s): GLUCAP in the last 168 hours.  No results found for this or any previous visit (from the past 240 hours).   Radiology Studies: CT Angio Abd/Pel W and/or Wo Contrast Result Date: 07/04/2024 EXAM: CTA ABDOMEN AND PELVIS WITHOUT AND WITH CONTRAST 07/04/2024 05:40:16 PM TECHNIQUE: CTA images of the abdomen and pelvis without and with intravenous contrast. 100 mL (iohexol  (OMNIPAQUE ) 350 MG/ML injection 100 mL IOHEXOL  350 MG/ML SOLN) was administered. Three-dimensional MIP/volume rendered formations were performed. Automated exposure control, iterative reconstruction, and/or weight based adjustment of the mA/kV was utilized to reduce the radiation dose to as low as reasonably achievable. COMPARISON: 04/25/2024 CLINICAL HISTORY: Lower GI bleed; Mesenteric ischemia, chronic. Generalized weakness for 3 days. Black stools. Negative occult fecal test. FINDINGS: VASCULATURE: AORTA: Diffuse calcification of the abdominal aorta. No acute finding. No abdominal aortic aneurysm. No dissection. CELIAC TRUNK: Stent in the celiac axis origin, which is patent. No dissection or critical occlusion identified. SUPERIOR MESENTERIC ARTERY: Stent in the superior mesenteric artery origin, which is patent. No dissection or critical occlusion identified. RENAL ARTERIES: No acute finding. No occlusion or significant stenosis. ILIAC ARTERIES: Diffuse calcification of the iliac arteries. No acute finding. No occlusion or significant stenosis. LIVER: Mild diffuse fatty infiltration of the liver. Likely hepatic cirrhosis within the large lateral  segment of the left lobe and nodular liver contour. No focal lesions. No bile duct dilatation. GALLBLADDER AND BILE DUCTS: Cholelithiasis. No biliary ductal dilatation. SPLEEN: The spleen is unremarkable. PANCREAS: The pancreas is unremarkable. ADRENAL GLANDS: Bilateral adrenal glands demonstrate no acute abnormality. KIDNEYS, URETERS AND BLADDER: Renal nephrograms are symmetrical. No stones in the kidneys or ureters. No hydronephrosis. No perinephric or periureteral stranding. Urinary bladder is unremarkable. GI AND BOWEL: Stomach, small bowel, and colon are not abnormally distended. No wall thickening or inflammatory infiltration are demonstrated. No mesenteric hematoma. No intraluminal contrast extravasation is identified. No focal site of active gastrointestinal hemorrhage is identified. REPRODUCTIVE: Reproductive organs are unremarkable. PERITONEUM AND RETRPERITONEUM: No ascites or free air. LUNG BASE: Lung bases are clear. Cardiac enlargement. LYMPH NODES: No lymphadenopathy. BONES AND SOFT TISSUES: Degenerative changes in the spine and hips. Postoperative mesh hernia repair of the anterior abdominal wall. No acute abnormality of the bones. No acute soft tissue abnormality. IMPRESSION: 1. No evidence of active gastrointestinal hemorrhage or mesenteric ischemia. 2. Patent celiac axis and superior mesenteric artery stents. No dissection or critical occlusion identified. 3. Likely hepatic cirrhosis with nodular liver contour. No focal lesions or bile duct dilatation. Electronically signed by: Elsie Gravely MD 07/04/2024 05:52 PM  EST RP Workstation: [id number]    Scheduled Meds:  ALPRAZolam   0.25 mg Oral QHS   amLODipine   5 mg Oral Daily   artificial tears  1 drop Both Eyes BID   aspirin   81 mg Oral Daily   cyanocobalamin   1,000 mcg Oral QHS   cycloSPORINE   1 drop Both Eyes BID   enoxaparin  (LOVENOX ) injection  40 mg Subcutaneous Q24H   feeding supplement  237 mL Oral BID BM   gabapentin   100 mg Oral  QHS   hydrALAZINE   25 mg Oral TID   irbesartan   37.5 mg Oral Daily   lactobacillus  1 g Oral TID WC   Muscle Rub   Topical TID   oseltamivir   30 mg Oral BID   pantoprazole  (PROTONIX ) IV  40 mg Intravenous BID   tamsulosin   0.4 mg Oral Daily   Continuous Infusions:  cefTRIAXone  (ROCEPHIN )  IV     lactated ringers  50 mL/hr at 07/04/24 2136     LOS: 0 days   Time spent: 55 mins  Tayte Mcwherter Vicci, MD How to contact the [facility] Attending or Consulting provider 7A - 7P or covering provider during after hours 7P -7A, for this patient?  Check the care team in [facility] and look for a) attending/consulting TR[facility] provider listed and b) the TR[facility] team listed Log into [email or web address] to find provider on call.  Locate the [facility] provider you are looking for under [facility] and page to a number that you can be directly reached. If you still have difficulty reaching the provider, please page the [facility] (Director on Call) for the Hospitalists listed on amion for assistance.  07/05/2024, 1:08 PM    "

## 2024-07-05 NOTE — Hospital Course (Addendum)
 89 y.o. 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  Also endorses black stools, some bright red blood around her stool, and lightheadedness.  Denies use of NSAIDs.  Endorses exposure to influenza A at her assisted living facility.  No reported upper respiratory symptoms.   In the ER, afebrile with no leukocytosis, hypertensive.  FOBT negative.  Hemoglobin at baseline 10.4 with MCV of 82.  UA positive for pyuria.   The patient received IV Rocephin  1 g x 1 due to concern for possible UTI contributing to generalized weakness.  In the ER, the patient is too weak, she lives alone, and is too weak to go home on oral antibiotics.  Admitted by [facility], hospitalist service.   ED Course: Temperature 98.4.  BP 180/66, pulse 72, respiratory 18, O2 saturation 99% on room air.  Overall, the patient is generalized weakness improved, and she was evaluated by physical therapy.  She ambulated 100 feet with a walker with standby assist. Unfortunately, she continued to have abdominal pain, particularly postprandial.  CTA AP on 07/04/2024 was negative for active GI bleed, negative for mesenteric ischemia, with patent mesenteric and celiac artery stents.  GI was consulted to assist with management. It was felt that this may have been a combination of some GERD and constipation.  The patient was given a fleets enema, and she had a bowel movement.  This helped with her abdominal pain.  She subsequently tolerated her diet with minimal to no pain.

## 2024-07-05 NOTE — Telephone Encounter (Signed)
 Thank you-I am following along electronically

## 2024-07-05 NOTE — Plan of Care (Signed)
" °  Problem: Education: Goal: Knowledge of General Education information will improve Description: Including pain rating scale, medication(s)/side effects and non-pharmacologic comfort measures Outcome: Progressing   Problem: Clinical Measurements: Goal: Respiratory complications will improve Outcome: Progressing   Problem: Pain Managment: Goal: General experience of comfort will improve and/or be controlled Outcome: Progressing   Problem: Safety: Goal: Ability to remain free from injury will improve Outcome: Progressing   Problem: Skin Integrity: Goal: Risk for impaired skin integrity will decrease Outcome: Progressing   "

## 2024-07-05 NOTE — Evaluation (Signed)
 Physical Therapy Evaluation Patient Details Name: [name] MRN: [id number] DOB: [birth year] Today's Date: 07/05/2024  History of Present Illness  [name] is a 89 y.o. 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  Also endorses black stools, some bright red blood around her stool, and lightheadedness.  Denies use of NSAIDs.  Endorses exposure to influenza A at her assisted living facility.  No reported upper respiratory symptoms.   Clinical Impression  Patient functioning near/at baseline for functional mobility and gait demonstrating good return for ambulating in room, hallway using RW without loss of balance. Patient encouraged to ambulate daily as tolerated with nursing staff/mobility tech. Plan:  Patient discharged from physical therapy to care of nursing for ambulation daily as tolerated for length of stay.          If plan is discharge home, recommend the following: A little help with walking and/or transfers;Help with stairs or ramp for entrance;Assistance with cooking/housework;Assist for transportation   Can travel by private vehicle        Equipment Recommendations None recommended by PT  Recommendations for Other Services       Functional Status Assessment Patient has had a recent decline in their functional status and/or demonstrates limited ability to make significant improvements in function in a reasonable and predictable amount of time     Precautions / Restrictions Precautions Precautions: Fall Recall of Precautions/Restrictions: Intact Restrictions Weight Bearing Restrictions Per Provider Order: No      Mobility  Bed Mobility Overal bed mobility: Independent                  Transfers Overall transfer  level: Needs assistance   Transfers: Sit to/from Stand, Bed to chair/wheelchair/BSC Sit to Stand: Supervision   Step pivot transfers: Supervision       General transfer comment: good return for transferring to chair without AD and using RW    Ambulation/Gait Ambulation/Gait assistance: Modified independent (Device/Increase time), Supervision Gait Distance (Feet): 100 Feet Assistive device: Rolling walker (2 wheels) Gait Pattern/deviations: Decreased step length - right, Decreased step length - left, Decreased stride length Gait velocity: near normal     General Gait Details: good return for ambulating in room, hallway wtihout loss of balance using RW  Stairs            Wheelchair Mobility     Tilt Bed    Modified Rankin (Stroke Patients Only)       Balance Overall balance assessment: Needs assistance Sitting-balance support: Feet supported, No upper extremity supported Sitting balance-Leahy Scale: Good Sitting balance - Comments: seated at EOB   Standing balance support: During functional activity, No upper extremity supported Standing balance-Leahy Scale: Poor Standing balance comment: fair/poor without AD, fair/good using RW                             Pertinent Vitals/Pain Pain Assessment Pain Assessment: No/denies pain    Home Living Family/patient expects to be discharged to:: Assisted living                 Home Equipment: Agricultural Consultant (2 wheels)      Prior Function Prior Level of Function : Independent/Modified Independent;Needs assist       Physical Assist : ADLs (  physical);Mobility (physical) Mobility (physical): Bed mobility;Transfers;Gait;Stairs ADLs (physical): IADLs;Bathing Mobility Comments: Ambulates using RW. Denies fall history. ADLs Comments: Physically independent for most ADL's; pt reports supervision assist by staff for safety and assist to wash her back. IADL assist.     Extremity/Trunk Assessment    Upper Extremity Assessment Upper Extremity Assessment: Defer to OT evaluation    Lower Extremity Assessment Lower Extremity Assessment: Overall WFL for tasks assessed    Cervical / Trunk Assessment Cervical / Trunk Assessment: Normal  Communication   Communication Communication: No apparent difficulties    Cognition Arousal: Alert Behavior During Therapy: WFL for tasks assessed/performed                             Following commands: Intact       Cueing Cueing Techniques: Verbal cues     General Comments      Exercises     Assessment/Plan    PT Assessment All further PT needs can be met in the next venue of care  PT Problem List Decreased strength;Decreased activity tolerance;Decreased mobility;Decreased balance       PT Treatment Interventions      PT Goals (Current goals can be found in the Care Plan section)  Acute Rehab PT Goals Patient Stated Goal: return home with ALF staff to assist PT Goal Formulation: With patient Time For Goal Achievement: 07/05/24 Potential to Achieve Goals: Good    Frequency       Co-evaluation PT/OT/SLP Co-Evaluation/Treatment: Yes Reason for Co-Treatment: To address functional/ADL transfers PT goals addressed during session: Mobility/safety with mobility;Balance;Proper use of DME         AM-PAC PT 6 Clicks Mobility  Outcome Measure Help needed turning from your back to your side while in a flat bed without using bedrails?: None Help needed moving from lying on your back to sitting on the side of a flat bed without using bedrails?: None Help needed moving to and from a bed to a chair (including a wheelchair)?: None Help needed standing up from a chair using your arms (e.g., wheelchair or bedside chair)?: A Little Help needed to walk in hospital room?: A Little Help needed climbing 3-5 steps with a railing? : A Little 6 Click Score: 21    End of Session   Activity Tolerance: Patient tolerated treatment  well;Patient limited by fatigue Patient left: in chair;with call bell/phone within reach Nurse Communication: Mobility status PT Visit Diagnosis: Unsteadiness on feet (R26.81);Other abnormalities of gait and mobility (R26.89);Muscle weakness (generalized) (M62.81)    Time: 9176-9156 PT Time Calculation (min) (ACUTE ONLY): 20 min   Charges:   PT Evaluation $PT Eval Moderate Complexity: 1 Mod PT Treatments $Therapeutic Activity: 8-22 mins PT General Charges $$ ACUTE PT VISIT: 1 Visit         2:58 PM, 07/05/2024 Lynwood Music, MPT Physical Therapist with [facility] [phone] office [phone] mobile phone

## 2024-07-06 DIAGNOSIS — R531 Weakness: Secondary | ICD-10-CM | POA: Diagnosis not present

## 2024-07-06 DIAGNOSIS — N390 Urinary tract infection, site not specified: Secondary | ICD-10-CM | POA: Diagnosis not present

## 2024-07-06 DIAGNOSIS — D649 Anemia, unspecified: Secondary | ICD-10-CM | POA: Diagnosis not present

## 2024-07-06 LAB — CBC
HCT: 33.6 % — ABNORMAL LOW (ref 36.0–46.0)
Hemoglobin: 10.3 g/dL — ABNORMAL LOW (ref 12.0–15.0)
MCH: 26.1 pg (ref 26.0–34.0)
MCHC: 30.7 g/dL (ref 30.0–36.0)
MCV: 85.1 fL (ref 80.0–100.0)
Platelets: 288 K/uL (ref 150–400)
RBC: 3.95 MIL/uL (ref 3.87–5.11)
RDW: 14.8 % (ref 11.5–15.5)
WBC: 6.3 K/uL (ref 4.0–10.5)
nRBC: 0 % (ref 0.0–0.2)

## 2024-07-06 LAB — BASIC METABOLIC PANEL WITH GFR
Anion gap: 13 (ref 5–15)
BUN: 16 mg/dL (ref 8–23)
CO2: 26 mmol/L (ref 22–32)
Calcium: 9.4 mg/dL (ref 8.9–10.3)
Chloride: 99 mmol/L (ref 98–111)
Creatinine, Ser: 0.89 mg/dL (ref 0.44–1.00)
GFR, Estimated: >60 mL/min
Glucose, Bld: 109 mg/dL — ABNORMAL HIGH (ref 70–99)
Potassium: 4.2 mmol/L (ref 3.5–5.1)
Sodium: 138 mmol/L (ref 135–145)

## 2024-07-06 MED ORDER — SODIUM CHLORIDE 1 G PO TABS
1.0000 g | ORAL_TABLET | Freq: Every morning | ORAL | Status: DC
  Administered 2024-07-07 – 2024-07-08 (×2): 1 g via ORAL
  Filled 2024-07-06 (×2): qty 1

## 2024-07-06 MED ORDER — PANTOPRAZOLE SODIUM 40 MG PO TBEC: 40.0000 mg | DELAYED_RELEASE_TABLET | Freq: Every evening | ORAL | Status: DC

## 2024-07-06 MED ORDER — VITAMIN D 25 MCG (1000 UNIT) PO TABS
1000.0000 [IU] | ORAL_TABLET | Freq: Every day | ORAL | Status: DC
  Administered 2024-07-07 – 2024-07-08 (×2): 1000 [IU] via ORAL
  Filled 2024-07-06 (×2): qty 1

## 2024-07-06 MED ORDER — SENNOSIDES-DOCUSATE SODIUM 8.6-50 MG PO TABS
1.0000 | ORAL_TABLET | Freq: Every evening | ORAL | Status: DC | PRN
  Administered 2024-07-06: 1 via ORAL
  Filled 2024-07-06: qty 1

## 2024-07-06 MED ORDER — PSYLLIUM 95 % PO PACK
1.0000 | PACK | Freq: Two times a day (BID) | ORAL | Status: DC
  Administered 2024-07-06 – 2024-07-07 (×3): 1 via ORAL
  Filled 2024-07-06 (×4): qty 1

## 2024-07-06 NOTE — Telephone Encounter (Signed)
"  See telephone note  "

## 2024-07-06 NOTE — Plan of Care (Signed)

## 2024-07-06 NOTE — Progress Notes (Addendum)
 " PROGRESS NOTE   [name]  FMW:[id number] DOB: 06/20/1932 DOA: 07/04/2024 PCP: [name], MD   Chief Complaint  Patient presents with   Weakness   Level of care: Med-Surg  Brief Admission History:  89 y.o. 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  Also endorses black stools, some bright red blood around her stool, and lightheadedness.  Denies use of NSAIDs.  Endorses exposure to influenza A at her assisted living facility.  No reported upper respiratory symptoms.   In the ER, afebrile with no leukocytosis, hypertensive.  FOBT negative.  Hemoglobin at baseline 10.4 with MCV of 82.  UA positive for pyuria.   The patient received IV Rocephin  1 g x 1 due to concern for possible UTI contributing to generalized weakness.  In the ER, the patient is too weak, she lives alone, and is too weak to go home on oral antibiotics.  Admitted by [facility], hospitalist service.   ED Course: Temperature 98.4.  BP 180/66, pulse 72, respiratory 18, O2 saturation 99% on room air.   Assessment and Plan:  Generalized weakness -- Improving Treating UTI IV Rocephin  ordered pending culture and sensitivity results Tamiflu  30 mg twice daily x 5 days, for post influenza A exposure Gentle IV fluid hydration PT/OT evaluations pending  Fall precautions   UTI UA positive for pyuria Follow urine culture for ID and sensitivities Continue Rocephin , de-escalate IV antibiotics when able Monitor fever curve and WBCs   Melenotic appearing stool Normocytic anemia  FOBT was negative in the ER, hemoglobin stable at 10.4 IV PPI, Protonix  40 mg twice daily Hemoccult stools ordered  CBC in AM  Anemia panel reassuring    Post influenza A exposure Exposure to influenza A at her  assisted living facility Pt reported one of her aides had to leave early due to flulike symptoms. Tamiflu  30 mg twice daily x 5 days for prophylaxis  DuoNebs as needed and antitussives as needed   Essential hypertension BP is not at goal, elevated Resumed home Norvasc , hydralazine , and irbesartan    History of incomplete bladder emptying Resume home Flomax  Monitor urine output   GERD PPI ordered.   Generalized anxiety Resumed home Xanax    Prediabetes On metformin  prior to admission   Polyneuropathy Resumed home gabapentin   DVT prophylaxis: enoxaparin  Code Status: DNR  Family Communication:  Disposition: anticipate DC home tomorrow if stable and improved   Consultants:   Procedures:   Antimicrobials:    Subjective: Pt says her abdominal pain is better today.  She has not had a BM in a few days.   Objective: Vitals:   07/05/24 1240 07/05/24 2023 07/06/24 0431 07/06/24 1213  BP: (!) 152/44 (!) 162/57 (!) 148/40 (!) 166/56  Pulse: 70 73 (!) 57 67  Resp:  18 18   Temp: 97.9 F (36.6 C) 98.2 F (36.8 C) 98 F (36.7 C) 98.3 F (36.8 C)  TempSrc: Oral   Oral  SpO2: 93% 95% 96% 94%  Weight:      Height:        Intake/Output Summary (Last 24 hours) at 07/06/2024 1421 Last data filed at 07/06/2024 0500 Gross per 24 hour  Intake 480 ml  Output --  Net 480 ml   Filed Weights   07/04/24 1539 07/04/24 2049  Weight: 77.1 kg  77.4 kg   Examination:  General exam: Appears calm and comfortable  Respiratory system: Clear to auscultation. Respiratory effort normal. Cardiovascular system: normal S1 & S2 heard. No JVD, murmurs, rubs, gallops or clicks. No pedal edema. Gastrointestinal system: Abdomen is nondistended, soft and nontender. No organomegaly or masses felt. Normal bowel sounds heard. Central nervous system: Alert and oriented. No focal neurological deficits. Extremities: Symmetric 5 x 5 power. Skin: No rashes, lesions or ulcers. Psychiatry: Judgement and  insight appear normal. Mood & affect appropriate.   Data Reviewed: I have personally reviewed following labs and imaging studies  CBC: Recent Labs  Lab 07/04/24 1630 07/06/24 0420  WBC 7.0 6.3  NEUTROABS 4.8  --   HGB 10.4* 10.3*  HCT 32.9* 33.6*  MCV 82.9 85.1  PLT 274 288    Basic Metabolic Panel: Recent Labs  Lab 07/04/24 1630 07/06/24 0420  NA 136 138  K 4.2 4.2  CL 99 99  CO2 23 26  GLUCOSE 116* 109*  BUN 17 16  CREATININE 0.77 0.89  CALCIUM 9.2 9.4    CBG: No results for input(s): GLUCAP in the last 168 hours.  Recent Results (from the past 240 hours)  Urine Culture     Status: Abnormal (Preliminary result)   Collection Time: 07/04/24  4:09 PM   Specimen: Urine, Random  Result Value Ref Range Status   Specimen Description   Final    URINE, RANDOM Performed at [facility], [address], [place], [place] [address]    Special Requests   Final    NONE Reflexed from [id number] Performed at [facility], [address], [place], [place] [address]    Culture (A)  Final    >=100,000 COLONIES/mL ESCHERICHIA COLI SUSCEPTIBILITIES TO FOLLOW Performed at [facility], [address] [place], [place] [address]    Report Status PENDING  Incomplete     Radiology Studies: CT Angio Abd/Pel W and/or Wo Contrast Result Date: 07/04/2024 EXAM: CTA ABDOMEN AND PELVIS WITHOUT AND WITH CONTRAST 07/04/2024 05:40:16 PM TECHNIQUE: CTA images of the abdomen and pelvis without and with intravenous contrast. 100 mL (iohexol  (OMNIPAQUE ) 350 MG/ML injection 100 mL IOHEXOL  350 MG/ML SOLN) was administered. Three-dimensional MIP/volume rendered formations were performed. Automated exposure control, iterative reconstruction, and/or weight based adjustment of the mA/kV was utilized to reduce the radiation dose to as low as reasonably achievable. COMPARISON: 04/25/2024 CLINICAL HISTORY: Lower GI bleed; Mesenteric ischemia, chronic. Generalized weakness for 3 days. Black stools.  Negative occult fecal test. FINDINGS: VASCULATURE: AORTA: Diffuse calcification of the abdominal aorta. No acute finding. No abdominal aortic aneurysm. No dissection. CELIAC TRUNK: Stent in the celiac axis origin, which is patent. No dissection or critical occlusion identified. SUPERIOR MESENTERIC ARTERY: Stent in the superior mesenteric artery origin, which is patent. No dissection or critical occlusion identified. RENAL ARTERIES: No acute finding. No occlusion or significant stenosis. ILIAC ARTERIES: Diffuse calcification of the iliac arteries. No acute finding. No occlusion or significant stenosis. LIVER: Mild diffuse fatty infiltration of the liver. Likely hepatic cirrhosis within the large lateral segment of the left lobe and nodular liver contour. No focal lesions. No bile duct dilatation. GALLBLADDER AND BILE DUCTS: Cholelithiasis. No biliary ductal dilatation. SPLEEN: The spleen is unremarkable. PANCREAS: The pancreas is unremarkable. ADRENAL GLANDS: Bilateral adrenal glands demonstrate no acute abnormality. KIDNEYS, URETERS AND BLADDER: Renal nephrograms are symmetrical. No stones in the kidneys or ureters. No hydronephrosis. No perinephric or periureteral stranding. Urinary bladder is unremarkable. GI AND BOWEL: Stomach,  small bowel, and colon are not abnormally distended. No wall thickening or inflammatory infiltration are demonstrated. No mesenteric hematoma. No intraluminal contrast extravasation is identified. No focal site of active gastrointestinal hemorrhage is identified. REPRODUCTIVE: Reproductive organs are unremarkable. PERITONEUM AND RETRPERITONEUM: No ascites or free air. LUNG BASE: Lung bases are clear. Cardiac enlargement. LYMPH NODES: No lymphadenopathy. BONES AND SOFT TISSUES: Degenerative changes in the spine and hips. Postoperative mesh hernia repair of the anterior abdominal wall. No acute abnormality of the bones. No acute soft tissue abnormality. IMPRESSION: 1. No evidence of active  gastrointestinal hemorrhage or mesenteric ischemia. 2. Patent celiac axis and superior mesenteric artery stents. No dissection or critical occlusion identified. 3. Likely hepatic cirrhosis with nodular liver contour. No focal lesions or bile duct dilatation. Electronically signed by: Elsie Gravely MD 07/04/2024 05:52 PM EST RP Workstation: [id number]    Scheduled Meds:  ALPRAZolam   0.25 mg Oral QHS   amLODipine   5 mg Oral Daily   artificial tears  1 drop Both Eyes BID   aspirin   81 mg Oral Daily   cyanocobalamin   1,000 mcg Oral QHS   cycloSPORINE   1 drop Both Eyes BID   enoxaparin  (LOVENOX ) injection  40 mg Subcutaneous Q24H   feeding supplement  237 mL Oral BID BM   gabapentin   100 mg Oral QHS   hydrALAZINE   25 mg Oral TID   irbesartan   37.5 mg Oral Daily   lactobacillus  1 g Oral TID WC   Muscle Rub   Topical TID   oseltamivir   30 mg Oral BID   pantoprazole  (PROTONIX ) IV  40 mg Intravenous BID   senna-docusate  1 tablet Oral BID   tamsulosin   0.4 mg Oral Daily   Continuous Infusions:  cefTRIAXone  (ROCEPHIN )  IV 1 g (07/05/24 1938)     LOS: 0 days   Time spent: 55 mins  Aidee Latimore Vicci, MD How to contact the [facility] Attending or Consulting provider 7A - 7P or covering provider during after hours 7P -7A, for this patient?  Check the care team in [facility] and look for a) attending/consulting [facility] provider listed and b) the [facility] team listed Log into [email or web address] to find provider on call.  Locate the [facility] provider you are looking for under [facility] and page to a number that you can be directly reached. If you still have difficulty reaching the provider, please page the [facility] (Director on Call) for the Hospitalists listed on amion for assistance.  07/06/2024, 2:21 PM    "

## 2024-07-06 NOTE — Progress Notes (Signed)
 Mobility Specialist Progress Note:    07/06/24 1215  Mobility  Activity Ambulated with assistance  Level of Assistance Independent  Assistive Device Front wheel walker  Distance Ambulated (ft) 300 ft  Range of Motion/Exercises Active;All extremities  Activity Response Tolerated well  Mobility Referral Yes  Mobility visit 1 Mobility  Mobility Specialist Start Time (ACUTE ONLY) 1215  Mobility Specialist Stop Time (ACUTE ONLY) 1235  Mobility Specialist Time Calculation (min) (ACUTE ONLY) 20 min   Pt received in chair, agreeable to mobility. Independently able to stand and ambulate with RW. Tolerated well,asx throughout. Returned to chair, all needs met.  Zeferino Mounts Mobility Specialist Please contact via Special Educational Needs Teacher or  Rehab office at [phone]

## 2024-07-06 NOTE — Plan of Care (Signed)
  Problem: Education: Goal: Knowledge of General Education information will improve Description: Including pain rating scale, medication(s)/side effects and non-pharmacologic comfort measures Outcome: Progressing   Problem: Clinical Measurements: Goal: Ability to maintain clinical measurements within normal limits will improve Outcome: Progressing Goal: Respiratory complications will improve Outcome: Progressing Goal: Cardiovascular complication will be avoided Outcome: Progressing   Problem: Elimination: Goal: Will not experience complications related to bowel motility Outcome: Progressing   Problem: Pain Managment: Goal: General experience of comfort will improve and/or be controlled Outcome: Progressing   Problem: Safety: Goal: Ability to remain free from injury will improve Outcome: Progressing

## 2024-07-07 ENCOUNTER — Other Ambulatory Visit: Payer: Self-pay | Admitting: Family Medicine

## 2024-07-07 DIAGNOSIS — N3000 Acute cystitis without hematuria: Secondary | ICD-10-CM | POA: Diagnosis not present

## 2024-07-07 DIAGNOSIS — R531 Weakness: Secondary | ICD-10-CM | POA: Diagnosis not present

## 2024-07-07 DIAGNOSIS — N1831 Chronic kidney disease, stage 3a: Secondary | ICD-10-CM

## 2024-07-07 DIAGNOSIS — N390 Urinary tract infection, site not specified: Secondary | ICD-10-CM | POA: Diagnosis not present

## 2024-07-07 LAB — URINE CULTURE: Culture: >=100000 — AB

## 2024-07-07 MED ORDER — PANTOPRAZOLE SODIUM 40 MG PO TBEC
40.0000 mg | DELAYED_RELEASE_TABLET | Freq: Two times a day (BID) | ORAL | Status: DC
  Administered 2024-07-07 – 2024-07-08 (×3): 40 mg via ORAL
  Filled 2024-07-07 (×3): qty 1

## 2024-07-07 MED ORDER — CEFADROXIL 500 MG PO CAPS
500.0000 mg | ORAL_CAPSULE | Freq: Two times a day (BID) | ORAL | Status: DC
  Administered 2024-07-07 – 2024-07-08 (×3): 500 mg via ORAL
  Filled 2024-07-07 (×3): qty 1

## 2024-07-07 MED ORDER — SUCRALFATE 1 GM/10ML PO SUSP
1.0000 g | Freq: Three times a day (TID) | ORAL | Status: DC
  Administered 2024-07-07 – 2024-07-08 (×4): 1 g via ORAL
  Filled 2024-07-07 (×4): qty 10

## 2024-07-07 MED ORDER — FLEET ENEMA RE ENEM
1.0000 | ENEMA | Freq: Once | RECTAL | Status: AC
  Administered 2024-07-07: 1 via RECTAL

## 2024-07-07 NOTE — Plan of Care (Signed)
  Problem: Education: Goal: Knowledge of General Education information will improve Description: Including pain rating scale, medication(s)/side effects and non-pharmacologic comfort measures Outcome: Progressing   Problem: Health Behavior/Discharge Planning: Goal: Ability to manage health-related needs will improve Outcome: Progressing   Problem: Clinical Measurements: Goal: Ability to maintain clinical measurements within normal limits will improve Outcome: Progressing   Problem: Activity: Goal: Risk for activity intolerance will decrease Outcome: Progressing   Problem: Pain Managment: Goal: General experience of comfort will improve and/or be controlled Outcome: Progressing   Problem: Safety: Goal: Ability to remain free from injury will improve Outcome: Progressing

## 2024-07-07 NOTE — Plan of Care (Signed)

## 2024-07-07 NOTE — Consult Note (Addendum)
 "  Gastroenterology Consult   Referring Provider: No ref. provider found Primary Care Physician:  [name], MD Primary Gastroenterologist:  Dr. Cinderella   Patient ID: [name]; [id number]; February 07, 1932   Admit date: 07/04/2024  LOS: 0 days   Date of Consultation: 07/07/2024  Reason for Consultation:  abdominal pain/burning, dark stools, constipation   History of Present Illness   [name] is a 89 y.o. 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 Also with some complaints of abdominal pain and dark stools. GI consulted for further evaluation   Labs: afebrile with no leukocytosis FOBT negative Hemoglobin at baseline 10.3>>10.4 with MCV of 82.   Ferritin 15, iron  36 TIBC 370 sat 10 folate 14.3 B12 1137 UA positive for pyuria--started rocephin   CT angio abd pelvis w wo: No evidence of active gastrointestinal hemorrhage or mesenteric ischemia. 2. Patent celiac axis and superior mesenteric artery stents. No dissection or critical occlusion identified. 3. Likely hepatic cirrhosis with nodular liver contour. No focal lesions or bile duct dilatation  Consult: Patient reports that last week she began having burning sensation all over her abdomen after she eats lunch, even noting some burning into her chest. She reports stent placement 1 year ago and again about 6 months ago. States she feels decent in the morning and can eat a small breakfast without issue but around early afternoon around 2 hours after eating lunch she begins to have the burning sensation. Pain only subsides when she takes something. She has been given tylenol  at her SNF which helps a little, reports she got some vicodin here which seems to help but she does not want to take this. Her last BM was Sunday around lunch time. She  has been given miralax  this morning. She has a lot of belching. Denies burning in her throat. Some nausea but no vomiting. Her weight appears stable. Appetite has not been good. She denies Rectal bleeding, reports some dark stools but also endorses she drinks a lot of chocolate boost.    Last EGD;03/2024 - Non-obstructing Schatzki ring.                           - Erythematous mucosa in the antrum. Biopsied.                           - 2 cm hiatal hernia.                           - Normal duodenal bulb and second portion of the                            duodenum.                           No evidence of tumor, bleeding or ulceration in the                            examined portions  . STOMACH, BIOPSY:  -  Antral mucosa with moderate chronic inactive gastritis.  -  H pylori negative   Past Medical History:  Diagnosis Date   Blood transfusion without reported diagnosis    Cognitive dysfunction 03/04/2019   Patient scores 21 out of 30 on [place] cognitive assessment September 2020   Diabetes mellitus without complication (HCC)    Diabetic peripheral neuropathy associated with type 2 diabetes mellitus (HCC) 02/25/2022   Diverticulitis    Frailty 03/04/2019   H/O bilateral breast reduction surgery    Hyperlipidemia    a. intolerant to statins.    Hypertension     Past Surgical History:  Procedure Laterality Date   ABDOMINAL HYSTERECTOMY  2003   APPENDECTOMY     age 22   BIOPSY  04/10/2023   Procedure: BIOPSY;  Surgeon: Cinderella Deatrice FALCON, MD;  Location: [facility];  Service: Endoscopy;;   BREAST REDUCTION SURGERY     age 56   COLON SURGERY Left 2011   Hemicolectomy due to diverticulitis   ESOPHAGOGASTRODUODENOSCOPY (EGD) WITH PROPOFOL  N/A 04/10/2023   Procedure: ESOPHAGOGASTRODUODENOSCOPY (EGD) WITH PROPOFOL ;  Surgeon: Cinderella Deatrice FALCON, MD;  Location: [facility];  Service: Endoscopy;  Laterality: N/A;   EYE SURGERY  03/30/2009   cataract   IR ANGIOGRAM SELECTIVE  EACH ADDITIONAL VESSEL  04/15/2024   IR ANGIOGRAM VISCERAL SELECTIVE  04/11/2023   IR INTRAVASCULAR ULTRASOUND NON CORONARY  04/11/2023   IR RADIOLOGIST EVAL & MGMT  04/12/2024   IR TRANSCATH PLC STENT 1ST ART NOT LE CV CAR VERT CAR  04/11/2023   IR TRANSCATH PLC STENT 1ST ART NOT LE CV CAR VERT CAR  04/15/2024   IR US  GUIDE BX ASP/DRAIN  04/15/2024   IR US  GUIDE BX ASP/DRAIN  04/17/2024   IR US  GUIDE BX ASP/DRAIN  04/17/2024   IR US  GUIDE VASC ACCESS LEFT  04/15/2024   IR US  GUIDE VASC ACCESS RIGHT  04/11/2023   KNEE SURGERY Right 2003   knee cap   LAPAROSCOPIC INCISIONAL / UMBILICAL / VENTRAL HERNIA REPAIR  02/23/2007   REDUCTION MAMMAPLASTY Bilateral 2001   REFRACTIVE SURGERY     TOTAL KNEE ARTHROPLASTY Right 04/10/2022   Procedure: TOTAL KNEE ARTHROPLASTY;  Surgeon: Gerome Charleston, MD;  Location: [facility];  Service: Orthopedics;  Laterality: Right;  adductor canal    Prior to Admission medications  Medication Sig Start Date End Date Taking? Authorizing Provider  acetaminophen  (TYLENOL ) 325 MG tablet TAKE (2) TABLETS BY MOUTH EVERY SIX HOURS AS NEEDED FOR PAIN. MAX 3GM IN 24 HOURS. Patient taking differently: Take 650 mg by mouth every 6 (six) hours as needed for mild pain (pain score 1-3) or moderate pain (pain score 4-6). 07/28/23  Yes [name], MD  amLODipine  (NORVASC ) 5 MG tablet Take 1 tablet (5 mg total) by mouth daily. 06/14/24  Yes Luking, Glendia LABOR, MD  aspirin  (ASPIRIN  LOW DOSE) 81 MG chewable tablet CHEW (1) TABLET BY MOUTH ONCE DAILY. Patient taking differently: Chew 81 mg by mouth every morning. 09/06/23  Yes Luking, Glendia LABOR, MD  BETA CAROTENE PROVITAMIN A 25000 units capsule TAKE (1) CAPSULE BY MOUTH AT BEDTIME. Patient taking differently: Take 25,000 Units by mouth at bedtime. 02/24/24  Yes [name], MD  cyanocobalamin  1000 MCG tablet Take 1,000 mcg by mouth at bedtime.   Yes [provider]  cycloSPORINE  (RESTASIS ) 0.05 % ophthalmic emulsion Place 1  drop into both eyes 2 (two) times daily. 05/05/24  Yes Landy Barnie RAMAN, NP  estradiol  (ESTRACE ) 0.01 % CREA vaginal cream Place 1 g vaginally every other day. **Apply one blueberry sized amount vaginally every other  night for long term 05/20/24  Yes [provider]  gabapentin  (NEURONTIN ) 100 MG capsule Take 1 capsule (100 mg total) by mouth daily. 05/05/24  Yes Landy Barnie RAMAN, NP  hydrALAZINE  (APRESOLINE ) 25 MG tablet Take 1 tablet (25 mg total) by mouth 3 (three) times daily. 05/14/24  Yes Luking, Scott A, MD  IVIZIA DRY EYES 0.5 % SOLN Place 1 drop into both eyes 2 (two) times daily. 09/22/23  Yes [provider]  lactobacillus (FLORANEX/LACTINEX) PACK Take 1 packet (1 g total) by mouth 3 (three) times daily with meals. 04/22/24  Yes Rashid, Farhan, MD  Melatonin 10 MG TABS Take 10 mg by mouth at bedtime. 03/24/24  Yes Grooms, Courtney, PA-C  Menthol , Topical Analgesic, (BIOFREEZE COOL THE PAIN) 4 % GEL Apply 1 Application topically 3 (three) times daily as needed (for pain to neck/back). 1 application; topical Special Instructions: apply to back of neck three times daily due to pain. Three Times A Day 09:00 AM, 02:00 PM, 09:00 PM   Yes [provider]  metFORMIN  (GLUCOPHAGE ) 500 MG tablet TAKE (1) TABLET BY MOUTH IN THE MORNING & (1/2) TABLET (250MG ) BY MOUTH AT SUPPER Patient taking differently: Take 250-500 mg by mouth See admin instructions. TAKE (1) TABLET BY MOUTH IN THE MORNING & (1/2) TABLET (250MG ) BY MOUTH AT SUPPER 05/14/24  Yes Luking, Glendia LABOR, MD  methenamine  (HIPREX ) 1 g tablet Take 1 tablet (1 g total) by mouth 2 (two) times daily with a meal. Most effective when taken with a daily Vitamin C supplement. 05/05/24  Yes Landy Barnie RAMAN, NP  Multiple Vitamins-Minerals (PRESERVISION AREDS 2) CAPS Take 1 capsule by mouth in the morning and at bedtime.   Yes [provider]  ondansetron  (ZOFRAN ) 4 MG tablet Take 1 tablet (4 mg total) by mouth every 6 (six)  hours as needed for nausea or vomiting. 05/05/24  Yes Landy Barnie RAMAN, NP  pantoprazole  (PROTONIX ) 40 MG tablet Take 1 tablet (40 mg total) by mouth daily. 05/14/24  Yes [name], MD  polyethylene glycol (MIRALAX  / GLYCOLAX ) 17 g packet Take 17 g by mouth daily as needed for moderate constipation. 04/22/24  Yes Rashid, Farhan, MD  psyllium (METAMUCIL SMOOTH TEXTURE) 58.6 % powder One bid with water  stop lactulose Patient taking differently: Take 1 packet by mouth in the morning and at bedtime. *stop lactulose 05/14/24  Yes Luking, Scott A, MD  sodium chloride  1 g tablet One qd Patient taking differently: Take 1 g by mouth every morning. One qd 05/14/24  Yes Luking, Glendia LABOR, MD  tamsulosin  (FLOMAX ) 0.4 MG CAPS capsule Take 1 capsule (0.4 mg total) by mouth daily. 05/14/24  Yes [name], MD  valsartan  (DIOVAN ) 40 MG tablet Take 1 tablet (40 mg total) by mouth daily. 06/14/24  Yes [name], MD  vitamin D3 (CHOLECALCIFEROL ) 25 MCG tablet Take 1 tablet (1,000 Units total) by mouth daily. 05/20/24  Yes [name], MD  ALPRAZolam  (XANAX ) 0.25 MG tablet Take 1 tablet (0.25 mg total) by mouth at bedtime. 05/14/24   [name], MD  EASYMAX TEST test strip CHECK BLOOD SUGAR ONCE DAILY.(CALL MD IF BS BELOW 60: OR IF BS ABOVE 400) Patient not taking: Reported on 07/05/2024 06/21/24   [name], MD  feeding supplement (ENSURE PLUS HIGH PROTEIN) LIQD Take 237 mLs by mouth 2 (two) times daily between meals. 04/22/24   Dino Antu, MD  Safety Lancets 28G MISC CHECK BLOOD SUGAR ONCE DAILY.  Patient not taking: Reported on 07/05/2024 01/21/24   [name], MD    Current Facility-Administered Medications  Medication Dose Route Frequency Provider Last Rate Last Admin   acetaminophen  (TYLENOL ) tablet 500 mg  500 mg Oral Q6H PRN Hall, Carole N, DO   500 mg at 07/06/24 1616   ALPRAZolam  (XANAX ) tablet 0.25 mg  0.25 mg Oral QHS Shona Laurence N, DO   0.25 mg at 07/06/24 2139    amLODipine  (NORVASC ) tablet 5 mg  5 mg Oral Daily Shona Laurence N, DO   5 mg at 07/07/24 1017   artificial tears ophthalmic solution 1 drop  1 drop Both Eyes BID Shona Laurence N, DO   1 drop at 07/07/24 1019   aspirin  chewable tablet 81 mg  81 mg Oral Daily Shona Laurence SAILOR, DO   81 mg at 07/07/24 1017   cefTRIAXone  (ROCEPHIN ) 1 g in sodium chloride  0.9 % 100 mL IVPB  1 g Intravenous Q24H Shona Laurence N, DO 200 mL/hr at 07/06/24 2000 1 g at 07/06/24 2000   cholecalciferol  (VITAMIN D3) 25 MCG (1000 UNIT) tablet 1,000 Units  1,000 Units Oral Daily Vicci, Clanford L, MD   1,000 Units at 07/07/24 1018   cyanocobalamin  (VITAMIN B12) tablet 1,000 mcg  1,000 mcg Oral QHS Shona Laurence N, DO   1,000 mcg at 07/06/24 2139   cycloSPORINE  (RESTASIS ) 0.05 % ophthalmic emulsion 1 drop  1 drop Both Eyes BID Shona Laurence N, DO   1 drop at 07/07/24 1019   enoxaparin  (LOVENOX ) injection 40 mg  40 mg Subcutaneous Q24H Hall, Carole N, DO   40 mg at 07/06/24 2138   feeding supplement (ENSURE PLUS HIGH PROTEIN) liquid 237 mL  237 mL Oral BID BM Shona Laurence N, DO   237 mL at 07/07/24 1308   gabapentin  (NEURONTIN ) capsule 100 mg  100 mg Oral QHS Shona Laurence N, DO   100 mg at 07/06/24 2139   hydrALAZINE  (APRESOLINE ) tablet 25 mg  25 mg Oral TID Shona Laurence SAILOR, DO   25 mg at 07/07/24 1017   HYDROcodone -acetaminophen  (NORCO/VICODIN) 5-325 MG per tablet 1-2 tablet  1-2 tablet Oral Q4H PRN Johnson, Clanford L, MD   2 tablet at 07/06/24 1812   irbesartan  (AVAPRO ) tablet 37.5 mg  37.5 mg Oral Daily Shona Laurence N, DO   37.5 mg at 07/07/24 1009   lactobacillus (FLORANEX/LACTINEX) granules 1 g  1 g Oral TID WC Hall, Carole N, DO   1 g at 07/07/24 1308   melatonin tablet 6 mg  6 mg Oral QHS PRN Shona Laurence N, DO   6 mg at 07/06/24 2139   Muscle Rub CREA   Topical TID Shona Laurence SAILOR, DO   Given at 07/07/24 1018   ondansetron  (ZOFRAN ) injection 4 mg  4 mg Intravenous Q6H PRN Shona Laurence SAILOR, DO       oseltamivir  (TAMIFLU ) capsule 30 mg   30 mg Oral BID Hall, Carole N, DO   30 mg at 07/07/24 1017   pantoprazole  (PROTONIX ) EC tablet 40 mg  40 mg Oral QPM Johnson, Clanford L, MD       polyethylene glycol (MIRALAX  / GLYCOLAX ) packet 17 g  17 g Oral Daily PRN Shona Laurence N, DO   17 g at 07/06/24 1606   psyllium (HYDROCIL/METAMUCIL) 1 packet  1 packet Oral BID Vicci, Clanford L, MD   1 packet at 07/07/24 1018   senna-docusate (Senokot-S) tablet 1 tablet  1 tablet Oral QHS  PRN Vicci Afton CROME, MD   1 tablet at 07/06/24 2139   sodium chloride  tablet 1 g  1 g Oral q morning Johnson, Clanford L, MD   1 g at 07/07/24 1008   sodium phosphate  (FLEET) enema 1 enema  1 enema Rectal Once Tat, David, MD       tamsulosin  (FLOMAX ) capsule 0.4 mg  0.4 mg Oral Daily Fernley, Carole N, DO   0.4 mg at 07/07/24 1009    Allergies as of 07/04/2024 - Review Complete 07/04/2024  Allergen Reaction Noted   Statins Other (See Comments) 08/29/2012   Phenergan [promethazine hcl] Other (See Comments) 08/29/2012   Hydrocortisone Itching 04/02/2020   Lisinopril Other (See Comments) 02/24/2016   Prednisone  Rash 02/15/2011   Trazodone  and nefazodone Cough 10/02/2016    Family History  Problem Relation Age of Onset   Cancer Mother        ovaian   Hypertension Father        kidney   Stroke Father    Cancer Father    Hypertension Sister    Diabetes Brother     Social History   Socioeconomic History   Marital status: Widowed    Spouse name: Not on file   Number of children: Not on file   Years of education: Not on file   Highest education level: Not on file  Occupational History   Not on file  Tobacco Use   Smoking status: Never   Smokeless tobacco: Never  Vaping Use   Vaping status: Never Used  Substance and Sexual Activity   Alcohol  use: No   Drug use: No   Sexual activity: Not Currently    Birth control/protection: Abstinence  Other Topics Concern   Not on file  Social History Narrative   Not on file   Social Drivers of Health    Tobacco Use: Low Risk (07/04/2024)   Patient History    Smoking Tobacco Use: Never    Smokeless Tobacco Use: Never    Passive Exposure: Not on file  Financial Resource Strain: Low Risk (09/06/2022)   Overall Financial Resource Strain (CARDIA)    Difficulty of Paying Living Expenses: Not hard at all  Food Insecurity: No Food Insecurity (07/04/2024)   Epic    Worried About Radiation Protection Practitioner of Food in the Last Year: Never true    Ran Out of Food in the Last Year: Never true  Transportation Needs: No Transportation Needs (07/04/2024)   Epic    Lack of Transportation (Medical): No    Lack of Transportation (Non-Medical): No  Physical Activity: Sufficiently Active (09/06/2022)   Exercise Vital Sign    Days of Exercise per Week: 5 days    Minutes of Exercise per Session: 30 min  Stress: No Stress Concern Present (09/06/2022)   [employer] of Occupational Health - Occupational Stress Questionnaire    Feeling of Stress : Not at all  Social Connections: Moderately Isolated (07/04/2024)   Social Connection and Isolation Panel    Frequency of Communication with Friends and Family: More than three times a week    Frequency of Social Gatherings with Friends and Family: More than three times a week    Attends Religious Services: More than 4 times per year    Active Member of [employer] or Organizations: No    Attends Banker Meetings: Never    Marital Status: Widowed  Intimate Partner Violence: Not At Risk (07/04/2024)   Epic    Fear of Current or  Ex-Partner: No    Emotionally Abused: No    Physically Abused: No    Sexually Abused: No  Depression (PHQ2-9): Low Risk (03/03/2024)   Depression (PHQ2-9)    PHQ-2 Score: 0  Alcohol  Screen: Low Risk (09/06/2022)   Alcohol  Screen    Last Alcohol  Screening Score (AUDIT): 0  Housing: Low Risk (07/04/2024)   Epic    Unable to Pay for Housing in the Last Year: No    Number of Times Moved in the Last Year: 0    Homeless in the Last Year: No  Utilities:  Not At Risk (07/04/2024)   Epic    Threatened with loss of utilities: No  Health Literacy: Not on file     Review of Systems   Gen: Denies any fever, chills, loss of appetite, change in weight or weight loss CV: Denies chest pain, heart palpitations, syncope, edema  Resp: Denies shortness of breath with rest, cough, wheezing, coughing up blood, and pleurisy. GI: denies hematochezia, vomiting, diarrhea, dysphagia, odyonophagia, early satiety or weight loss. +constipation +burning in stomach +dark stools +nausea GU : Denies urinary burning, blood in urine, urinary frequency, and urinary incontinence. MS: Denies joint pain, limitation of movement, swelling, cramps, and atrophy.  Derm: Denies rash, itching, dry skin, hives. Psych: Denies depression, anxiety, memory loss, hallucinations, and confusion. Heme: Denies bruising or bleeding Neuro:  Denies any headaches, dizziness, paresthesias, shaking  Physical Exam   Vital Signs in last 24 hours: Temp:  [97.9 F (36.6 C)-98.1 F (36.7 C)] 97.9 F (36.6 C) (01/07 1002) Pulse Rate:  [62-72] 69 (01/07 1002) Resp:  [16-18] 16 (01/07 1002) BP: (148-187)/(47-63) 187/59 (01/07 1002) SpO2:  [92 %-95 %] 95 % (01/07 1002) Last BM Date : 07/04/24  General:   Alert,  Well-developed, well-nourished, pleasant and cooperative in NAD Head:  Normocephalic and atraumatic. Eyes:  Sclera clear, no icterus.   Conjunctiva pink. Ears:  Normal auditory acuity. Mouth:  No deformity or lesions, dentition normal. Neck:  Supple; no masses Lungs:  Clear throughout to auscultation.   No wheezes, crackles, or rhonchi. No acute distress. Heart:  Regular rate and rhythm; no murmurs, clicks, rubs,  or gallops. Abdomen:  Soft,  and nondistended. Upper Abdomen is diffusely tender No masses, hepatosplenomegaly or hernias noted. Normal bowel sounds, without guarding, and without rebound.   Msk:  Symmetrical without gross deformities. Normal posture. Extremities:  Without  clubbing or edema. Neurologic:  Alert and  oriented x4. Skin:  Intact without significant lesions or rashes. Psych:  Alert and cooperative. Normal mood and affect.  Intake/Output from previous day: 01/06 0701 - 01/07 0700 In: 358 [P.O.:358] Out: -  Intake/Output this shift: Total I/O In: 240 [P.O.:240] Out: -    Labs/Studies   Recent Labs Recent Labs    07/04/24 1630 07/06/24 0420  WBC 7.0 6.3  HGB 10.4* 10.3*  HCT 32.9* 33.6*  PLT 274 288   BMET Recent Labs    07/04/24 1630 07/06/24 0420  NA 136 138  K 4.2 4.2  CL 99 99  CO2 23 26  GLUCOSE 116* 109*  BUN 17 16  CREATININE 0.77 0.89  CALCIUM 9.2 9.4   LFT Recent Labs    07/04/24 1630  PROT 6.4*  ALBUMIN 4.0  AST 26  ALT 14  ALKPHOS 94  BILITOT 0.5    Assessment   [name] is a 89 y.o. year old female medical history of htn,  incomplete bladder emptying, history of bilateral pseudoaneurysm status  post iliac and mesenteric stenting in 2025, previous UTIs, who presented to the ED with generalized weakness x3 days. Also with some complaints of abdominal pain and dark stools. GI consulted for further evaluation   Burning Abdominal pain/history of mesenteric ischemia: -CT Angio with patient celiac axis and superior mesenteric artery stents. No dissection or critical occlusion identified. -reports pain onset last week, usually occurring about 2 hours after lunch, burning in nature -some burning in chest as well -nausea, without vomiting -per chart review, reported similar symptoms at last admission, prior to her EGD in October, which is outlined above.   Query if this is more reflux/acid mediated. No evidence of stent failure, as arteries are patent on imaging,  IR has also reviewed imaging and confirmed no concerns for stent failure at this time. Presentation is not consistent with typical mesenteric ischemia either. Will do PPI BID and add carafate  1g QID, bland diet for now.   Constipation: -No  BM since Sunday -on metamucil BID and miralax  PRN here -receiving opiates which is likely contributing  Recommend increasing miralax  to BID, fleet enema has been ordered, would limit opiates as able. She ultimately needs to remain on a good Bowel regimen upon discharge as well.   Dark stools: -Recent EGD as above -FOBT negative -Hgb stable at baseline 10 range -Iron  panel with mildly low iron  -drinking a lot of chocolate boost, no BRBPR  Agree with serial FOBT, trending hemoglobin. low suspicion for UGIB given stable hemoglobin, recent EGD   Of note, I did reach out to patient's son, [name], at her request and communicated my assessment and plan as outlined above with him. All questions were answered, patient's son verbalized understanding and is in agreement with plan .  Plan / Recommendations   -trend h&h -monitor for overt GI bleeding -PPI BID -Miralax  BID -agree with fleet enema -limit opiates as possible -bland, heart healthy diet -carafate  1g QID -agree with previously ordered serial FOBT to further evaluate for occult GI bleed -needs continued bowel regimen upon discharge    07/07/2024, 1:18 PM  Chelsea L. Mariette, MSN, APRN, AGNP-C Adult-Gerontology Nurse Practitioner [facility]   Attending note:  She was getting enema when I came to see her this evening.  She will be re-assessed in am.  "

## 2024-07-07 NOTE — Progress Notes (Signed)
 Mobility Specialist Progress Note:    07/07/24 1556  Mobility  Activity Ambulated with assistance  Level of Assistance Independent  Assistive Device Front wheel walker  Distance Ambulated (ft) 330 ft  Range of Motion/Exercises Active;All extremities  Activity Response Tolerated well  Mobility Referral Yes  Mobility visit 1 Mobility  Mobility Specialist Start Time (ACUTE ONLY) 1556  Mobility Specialist Stop Time (ACUTE ONLY) 1616  Mobility Specialist Time Calculation (min) (ACUTE ONLY) 20 min   Pt received in chair, agreeable to mobility. Independently able to stand and ambulate with RW. Tolerated well, asx throughout. Returned to chair, all needs met.  Jalayne Ganesh Mobility Specialist Please contact via Special Educational Needs Teacher or  Rehab office at [phone]

## 2024-07-07 NOTE — Progress Notes (Addendum)
 "          PROGRESS NOTE  [name] FMW:[id number] DOB: 1931/11/05 DOA: 07/04/2024 PCP: [name], MD  Brief History:  89 y.o. 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  Also endorses black stools, some bright red blood around her stool, and lightheadedness.  Denies use of NSAIDs.  Endorses exposure to influenza A at her assisted living facility.  No reported upper respiratory symptoms.   In the ER, afebrile with no leukocytosis, hypertensive.  FOBT negative.  Hemoglobin at baseline 10.4 with MCV of 82.  UA positive for pyuria.   The patient received IV Rocephin  1 g x 1 due to concern for possible UTI contributing to generalized weakness.  In the ER, the patient is too weak, she lives alone, and is too weak to go home on oral antibiotics.  Admitted by [facility], hospitalist service.   ED Course: Temperature 98.4.  BP 180/66, pulse 72, respiratory 18, O2 saturation 99% on room air.  Overall, the patient is generalized weakness improved, and she was evaluated by physical therapy.  She ambulated 100 feet with a walker with standby assist. Unfortunately, she continued to have abdominal pain, particularly postprandial.  CTA AP on 07/04/2024 was negative for active GI bleed, negative for mesenteric ischemia, with patent mesenteric and celiac artery stents.  GI was consulted to assist with management.   Assessment/Plan:   Active Problems:   Generalized weakness  Assessment and Plan: Generalized weakness -- Improving Treating UTI IV Rocephin  ordered  Tamiflu  30 mg twice daily x 5 days, for post influenza A exposure Given IVF PT/OT evaluations>>HHPT Fall precautions   UTI UA >50 WBC Follow urine culture>>pansensitive E coli Continue Rocephin ,  de-escalate IV antibiotics to cefadroxil   Melenotic appearing stool Normocytic anemia  FOBT was negative in the ER, hemoglobin stable at 10.4 IV PPI, Protonix  40 mg twice daily Hemoccult stools ordered  CBC in AM  Anemia panel--iron  saturation 10%, ferritin 15, B12 1137, folate 14.3  Abdominal pain -07/04/24 CTA AP--neg for mesenteric ischemia, patent mesenteric art and celiac stents -case discussed with Dr. Jennefer -start miralax  and add fleets enema -pt/son requested GI consult   Post influenza A exposure Exposure to influenza A at her assisted living facility Pt reported one of her aides had to leave early due to flulike symptoms. Tamiflu  30 mg twice daily x 5 days for prophylaxis  DuoNebs as needed and antitussives as needed   Essential hypertension Resumed home Norvasc , hydralazine , and irbesartan    History of incomplete bladder emptying Resume home Flomax  Monitor urine output   GERD PPI ordered.   Generalized anxiety Resumed home Xanax  -PDMP reviewed--xanax  0.25 mg, #30 filled 06/14/24   Prediabetes On metformin  prior to admission -06/07/24--A1C--6.7   Polyneuropathy Resumed home gabapentin        Total time spent 50 minutes.  Greater than 50% spent face to face counseling and coordinating care.      Family Communication:  son updated 1/7  Consultants:  GI  Code Status:  DNR  DVT Prophylaxis:   [place] Lovenox    Procedures: As Listed in Progress Note Above  Antibiotics: None        Subjective: She continues to have postprandial abdominal pain.  She denies any emesis.  She denies any diarrhea.  She has not had  a bowel movement in 4 days.  She denies any chest pain, shortness of breath, coughing, hemoptysis.  Objective: Vitals:   07/06/24 1213 07/06/24 2000 07/07/24 0405 07/07/24 1002  BP: (!) 166/56 (!) 162/63 (!) 148/47 (!) 187/59  Pulse: 67 72 62 69  Resp:  18 16 16   Temp: 98.3 F (36.8 C) 98 F (36.7 C) 98.1 F (36.7 C) 97.9 F (36.6  C)  TempSrc: Oral Oral Oral Oral  SpO2: 94% 95% 92% 95%  Weight:      Height:        Intake/Output Summary (Last 24 hours) at 07/07/2024 1400 Last data filed at 07/07/2024 0841 Gross per 24 hour  Intake 598 ml  Output --  Net 598 ml   Weight change:  Exam:  General:  Pt is alert, follows commands appropriately, not in acute distress HEENT: No icterus, No thrush, No neck mass, [place]/AT Cardiovascular: RRR, S1/S2, no rubs, no gallops Respiratory: CTA bilaterally, no wheezing, no crackles, no rhonchi Abdomen: Soft/+BS, mild diffuse tender, non distended, no guarding Extremities: No edema, No lymphangitis, No petechiae, No rashes, no synovitis   Data Reviewed: I have personally reviewed following labs and imaging studies Basic Metabolic Panel: Recent Labs  Lab 07/04/24 1630 07/06/24 0420  NA 136 138  K 4.2 4.2  CL 99 99  CO2 23 26  GLUCOSE 116* 109*  BUN 17 16  CREATININE 0.77 0.89  CALCIUM 9.2 9.4   Liver Function Tests: Recent Labs  Lab 07/04/24 1630  AST 26  ALT 14  ALKPHOS 94  BILITOT 0.5  PROT 6.4*  ALBUMIN 4.0   Recent Labs  Lab 07/04/24 1630  LIPASE 14   No results for input(s): AMMONIA in the last 168 hours. Coagulation Profile: No results for input(s): INR, PROTIME in the last 168 hours. CBC: Recent Labs  Lab 07/04/24 1630 07/06/24 0420  WBC 7.0 6.3  NEUTROABS 4.8  --   HGB 10.4* 10.3*  HCT 32.9* 33.6*  MCV 82.9 85.1  PLT 274 288   Cardiac Enzymes: No results for input(s): CKTOTAL, CKMB, CKMBINDEX, TROPONINI in the last 168 hours. BNP: Invalid input(s): POCBNP CBG: No results for input(s): GLUCAP in the last 168 hours. HbA1C: No results for input(s): HGBA1C in the last 72 hours. Urine analysis:    Component Value Date/Time   COLORURINE YELLOW 07/04/2024 1609   APPEARANCEUR HAZY (A) 07/04/2024 1609   APPEARANCEUR Clear 05/18/2024 1155   LABSPEC 1.004 (L) 07/04/2024 1609   PHURINE 7.0 07/04/2024 1609   GLUCOSEU  NEGATIVE 07/04/2024 1609   HGBUR NEGATIVE 07/04/2024 1609   BILIRUBINUR NEGATIVE 07/04/2024 1609   BILIRUBINUR Negative 05/18/2024 1155   KETONESUR NEGATIVE 07/04/2024 1609   PROTEINUR NEGATIVE 07/04/2024 1609   UROBILINOGEN 0.2 03/03/2024 0917   UROBILINOGEN 0.2 12/15/2014 1956   NITRITE NEGATIVE 07/04/2024 1609   LEUKOCYTESUR LARGE (A) 07/04/2024 1609   Sepsis Labs: @LABRCNTIP (procalcitonin:4,lacticidven:4) ) Recent Results (from the past 240 hours)  Urine Culture     Status: Abnormal   Collection Time: 07/04/24  4:09 PM   Specimen: Urine, Random  Result Value Ref Range Status   Specimen Description   Final    URINE, RANDOM Performed at [facility], [address], [place], [place] [address]    Special Requests   Final    NONE Reflexed from [id number] Performed at [facility], [address] [place], [place] [address]    Culture >=100,000 COLONIES/mL ESCHERICHIA COLI (A)  Final   Report Status 07/07/2024 FINAL  Final   Organism ID, Bacteria ESCHERICHIA COLI (A)  Final      Susceptibility   Escherichia coli - MIC*    AMPICILLIN  <=2 SENSITIVE Sensitive     CEFAZOLIN  (URINE) Value in next row Sensitive      <=1 SENSITIVEThis is a modified FDA-approved test that has been validated and its performance characteristics determined by the reporting laboratory.  This laboratory is certified under the Clinical Laboratory Improvement Amendments CLIA as qualified to perform high complexity clinical laboratory testing.    CEFEPIME  Value in next row Sensitive      <=1 SENSITIVEThis is a modified FDA-approved test that has been validated and its performance characteristics determined by the reporting laboratory.  This laboratory is certified under the Clinical Laboratory Improvement Amendments CLIA as qualified to perform high complexity clinical laboratory testing.    ERTAPENEM Value in next row Sensitive      <=1 SENSITIVEThis is a modified FDA-approved test that has been validated and its  performance characteristics determined by the reporting laboratory.  This laboratory is certified under the Clinical Laboratory Improvement Amendments CLIA as qualified to perform high complexity clinical laboratory testing.    CEFTRIAXONE  Value in next row Sensitive      <=1 SENSITIVEThis is a modified FDA-approved test that has been validated and its performance characteristics determined by the reporting laboratory.  This laboratory is certified under the Clinical Laboratory Improvement Amendments CLIA as qualified to perform high complexity clinical laboratory testing.    CIPROFLOXACIN  Value in next row Sensitive      <=1 SENSITIVEThis is a modified FDA-approved test that has been validated and its performance characteristics determined by the reporting laboratory.  This laboratory is certified under the Clinical Laboratory Improvement Amendments CLIA as qualified to perform high complexity clinical laboratory testing.    GENTAMICIN Value in next row Sensitive      <=1 SENSITIVEThis is a modified FDA-approved test that has been validated and its performance characteristics determined by the reporting laboratory.  This laboratory is certified under the Clinical Laboratory Improvement Amendments CLIA as qualified to perform high complexity clinical laboratory testing.    NITROFURANTOIN  Value in next row Sensitive      <=1 SENSITIVEThis is a modified FDA-approved test that has been validated and its performance characteristics determined by the reporting laboratory.  This laboratory is certified under the Clinical Laboratory Improvement Amendments CLIA as qualified to perform high complexity clinical laboratory testing.    TRIMETH /SULFA  Value in next row Sensitive      <=1 SENSITIVEThis is a modified FDA-approved test that has been validated and its performance characteristics determined by the reporting laboratory.  This laboratory is certified under the Clinical Laboratory Improvement Amendments CLIA as  qualified to perform high complexity clinical laboratory testing.    AMPICILLIN /SULBACTAM Value in next row Sensitive      <=1 SENSITIVEThis is a modified FDA-approved test that has been validated and its performance characteristics determined by the reporting laboratory.  This laboratory is certified under the Clinical Laboratory Improvement Amendments CLIA as qualified to perform high complexity clinical laboratory testing.    PIP/TAZO Value in next row Sensitive      <=4 SENSITIVEThis is a modified FDA-approved test that has been validated and its performance characteristics determined by the reporting laboratory.  This laboratory is certified under the Clinical Laboratory Improvement Amendments CLIA as qualified to perform high complexity clinical laboratory testing.    MEROPENEM Value in next row Sensitive      <=4 SENSITIVEThis  is a modified FDA-approved test that has been validated and its performance characteristics determined by the reporting laboratory.  This laboratory is certified under the Clinical Laboratory Improvement Amendments CLIA as qualified to perform high complexity clinical laboratory testing.    * >=100,000 COLONIES/mL ESCHERICHIA COLI     Scheduled Meds:  ALPRAZolam   0.25 mg Oral QHS   amLODipine   5 mg Oral Daily   artificial tears  1 drop Both Eyes BID   aspirin   81 mg Oral Daily   vitamin D3  1,000 Units Oral Daily   cyanocobalamin   1,000 mcg Oral QHS   cycloSPORINE   1 drop Both Eyes BID   enoxaparin  (LOVENOX ) injection  40 mg Subcutaneous Q24H   feeding supplement  237 mL Oral BID BM   gabapentin   100 mg Oral QHS   hydrALAZINE   25 mg Oral TID   irbesartan   37.5 mg Oral Daily   lactobacillus  1 g Oral TID WC   Muscle Rub   Topical TID   oseltamivir   30 mg Oral BID   pantoprazole   40 mg Oral QPM   psyllium  1 packet Oral BID   sodium chloride   1 g Oral q morning   sodium phosphate   1 enema Rectal Once   tamsulosin   0.4 mg Oral Daily   Continuous Infusions:   cefTRIAXone  (ROCEPHIN )  IV 1 g (07/06/24 2000)    Procedures/Studies: CT Angio Abd/Pel W and/or Wo Contrast Result Date: 07/04/2024 EXAM: CTA ABDOMEN AND PELVIS WITHOUT AND WITH CONTRAST 07/04/2024 05:40:16 PM TECHNIQUE: CTA images of the abdomen and pelvis without and with intravenous contrast. 100 mL (iohexol  (OMNIPAQUE ) 350 MG/ML injection 100 mL IOHEXOL  350 MG/ML SOLN) was administered. Three-dimensional MIP/volume rendered formations were performed. Automated exposure control, iterative reconstruction, and/or weight based adjustment of the mA/kV was utilized to reduce the radiation dose to as low as reasonably achievable. COMPARISON: 04/25/2024 CLINICAL HISTORY: Lower GI bleed; Mesenteric ischemia, chronic. Generalized weakness for 3 days. Black stools. Negative occult fecal test. FINDINGS: VASCULATURE: AORTA: Diffuse calcification of the abdominal aorta. No acute finding. No abdominal aortic aneurysm. No dissection. CELIAC TRUNK: Stent in the celiac axis origin, which is patent. No dissection or critical occlusion identified. SUPERIOR MESENTERIC ARTERY: Stent in the superior mesenteric artery origin, which is patent. No dissection or critical occlusion identified. RENAL ARTERIES: No acute finding. No occlusion or significant stenosis. ILIAC ARTERIES: Diffuse calcification of the iliac arteries. No acute finding. No occlusion or significant stenosis. LIVER: Mild diffuse fatty infiltration of the liver. Likely hepatic cirrhosis within the large lateral segment of the left lobe and nodular liver contour. No focal lesions. No bile duct dilatation. GALLBLADDER AND BILE DUCTS: Cholelithiasis. No biliary ductal dilatation. SPLEEN: The spleen is unremarkable. PANCREAS: The pancreas is unremarkable. ADRENAL GLANDS: Bilateral adrenal glands demonstrate no acute abnormality. KIDNEYS, URETERS AND BLADDER: Renal nephrograms are symmetrical. No stones in the kidneys or ureters. No hydronephrosis. No perinephric or  periureteral stranding. Urinary bladder is unremarkable. GI AND BOWEL: Stomach, small bowel, and colon are not abnormally distended. No wall thickening or inflammatory infiltration are demonstrated. No mesenteric hematoma. No intraluminal contrast extravasation is identified. No focal site of active gastrointestinal hemorrhage is identified. REPRODUCTIVE: Reproductive organs are unremarkable. PERITONEUM AND RETRPERITONEUM: No ascites or free air. LUNG BASE: Lung bases are clear. Cardiac enlargement. LYMPH NODES: No lymphadenopathy. BONES AND SOFT TISSUES: Degenerative changes in the spine and hips. Postoperative mesh hernia repair of the anterior abdominal wall. No acute abnormality of the bones. No  acute soft tissue abnormality. IMPRESSION: 1. No evidence of active gastrointestinal hemorrhage or mesenteric ischemia. 2. Patent celiac axis and superior mesenteric artery stents. No dissection or critical occlusion identified. 3. Likely hepatic cirrhosis with nodular liver contour. No focal lesions or bile duct dilatation. Electronically signed by: Elsie Gravely MD 07/04/2024 05:52 PM EST RP Workstation: [id number]    Alm Schneider, DO  [facility]  If 7PM-7AM, please contact night-coverage [email or web address] Password [email or web address] 07/07/2024, 2:00 PM   LOS: 0 days   "

## 2024-07-07 NOTE — Progress Notes (Signed)
 Mobility Specialist Progress Note:    07/07/24 1300  Mobility  Activity Ambulated with assistance  Level of Assistance Independent  Assistive Device Front wheel walker  Distance Ambulated (ft) 400 ft  Range of Motion/Exercises Active;All extremities  Activity Response Tolerated well  Mobility Referral Yes  Mobility visit 1 Mobility  Mobility Specialist Start Time (ACUTE ONLY) 1300  Mobility Specialist Stop Time (ACUTE ONLY) 1320  Mobility Specialist Time Calculation (min) (ACUTE ONLY) 20 min   Pt received in chair, agreeable to mobility. Independently able to stand and ambulate with RW. Tolerated well, c/o some abdominal pain. Returned to chair, all needs met.  Rodolfo Notaro Mobility Specialist Please contact via Special Educational Needs Teacher or  Rehab office at [phone]

## 2024-07-08 ENCOUNTER — Telehealth: Payer: Self-pay | Admitting: Gastroenterology

## 2024-07-08 DIAGNOSIS — K5900 Constipation, unspecified: Secondary | ICD-10-CM

## 2024-07-08 DIAGNOSIS — R1010 Upper abdominal pain, unspecified: Secondary | ICD-10-CM | POA: Diagnosis not present

## 2024-07-08 DIAGNOSIS — N1831 Chronic kidney disease, stage 3a: Secondary | ICD-10-CM | POA: Diagnosis not present

## 2024-07-08 DIAGNOSIS — N3000 Acute cystitis without hematuria: Secondary | ICD-10-CM | POA: Diagnosis not present

## 2024-07-08 DIAGNOSIS — K5909 Other constipation: Secondary | ICD-10-CM

## 2024-07-08 DIAGNOSIS — I10 Essential (primary) hypertension: Secondary | ICD-10-CM | POA: Diagnosis not present

## 2024-07-08 DIAGNOSIS — K921 Melena: Secondary | ICD-10-CM | POA: Diagnosis not present

## 2024-07-08 DIAGNOSIS — R110 Nausea: Secondary | ICD-10-CM

## 2024-07-08 DIAGNOSIS — N390 Urinary tract infection, site not specified: Secondary | ICD-10-CM | POA: Diagnosis not present

## 2024-07-08 LAB — CBC
HCT: 31.4 % — ABNORMAL LOW (ref 36.0–46.0)
Hemoglobin: 9.9 g/dL — ABNORMAL LOW (ref 12.0–15.0)
MCH: 26.5 pg (ref 26.0–34.0)
MCHC: 31.5 g/dL (ref 30.0–36.0)
MCV: 84 fL (ref 80.0–100.0)
Platelets: 254 K/uL (ref 150–400)
RBC: 3.74 MIL/uL — ABNORMAL LOW (ref 3.87–5.11)
RDW: 14.9 % (ref 11.5–15.5)
WBC: 6.9 K/uL (ref 4.0–10.5)
nRBC: 0 % (ref 0.0–0.2)

## 2024-07-08 LAB — BASIC METABOLIC PANEL WITH GFR
Anion gap: 7 (ref 5–15)
BUN: 17 mg/dL (ref 8–23)
CO2: 31 mmol/L (ref 22–32)
Calcium: 8.8 mg/dL — ABNORMAL LOW (ref 8.9–10.3)
Chloride: 96 mmol/L — ABNORMAL LOW (ref 98–111)
Creatinine, Ser: 0.78 mg/dL (ref 0.44–1.00)
GFR, Estimated: >60 mL/min
Glucose, Bld: 100 mg/dL — ABNORMAL HIGH (ref 70–99)
Potassium: 3.8 mmol/L (ref 3.5–5.1)
Sodium: 135 mmol/L (ref 135–145)

## 2024-07-08 LAB — MAGNESIUM: Magnesium: 2.2 mg/dL (ref 1.7–2.4)

## 2024-07-08 MED ORDER — POLYETHYLENE GLYCOL 3350 17 G PO PACK
17.0000 g | PACK | Freq: Two times a day (BID) | ORAL | Status: DC
  Administered 2024-07-08: 17 g via ORAL
  Filled 2024-07-08: qty 1

## 2024-07-08 MED ORDER — OSELTAMIVIR PHOSPHATE 30 MG PO CAPS: 30.0000 mg | ORAL_CAPSULE | Freq: Two times a day (BID) | ORAL | 0 refills | Status: DC

## 2024-07-08 MED ORDER — PANTOPRAZOLE SODIUM 40 MG PO TBEC: 40.0000 mg | DELAYED_RELEASE_TABLET | Freq: Two times a day (BID) | ORAL | 1 refills | Status: DC

## 2024-07-08 MED ORDER — SUCRALFATE 1 GM/10ML PO SUSP: 1.0000 g | Freq: Three times a day (TID) | ORAL | 2 refills | Status: DC

## 2024-07-08 MED ORDER — CEFADROXIL 500 MG PO CAPS: 500.0000 mg | ORAL_CAPSULE | Freq: Two times a day (BID) | ORAL | 0 refills | Status: DC

## 2024-07-08 MED ORDER — HYDROCODONE-ACETAMINOPHEN 5-325 MG PO TABS: 1.0000 | ORAL_TABLET | Freq: Four times a day (QID) | ORAL | 0 refills | Status: DC | PRN

## 2024-07-08 NOTE — Progress Notes (Signed)
 "  Gastroenterology Progress Note   Referring Provider: No ref. provider found Primary Care Physician:  [name], MD Primary Gastroenterologist:  Dr. Cinderella  Patient ID: [name]; [id number]; 1931/10/15   Subjective:    Feels better today. She tolerated breakfast. She had good results from the enema last night. She had a small stool with a lot of gas this morning. She thinks she may have been able to go without pain medication last night but she started hurting after dinner and didn't want to risk it. At SNF, she takes fiber supplement twice a day and generally has regular BMs. She feels like the carafate  and increasing pantoprazole  to twice daily has been helpful.  Objective:   Vital signs in last 24 hours: Temp:  [97.9 F (36.6 C)-98.2 F (36.8 C)] 98 F (36.7 C) (01/08 0455) Pulse Rate:  [56-74] 56 (01/08 0455) Resp:  [16-18] 18 (01/08 0455) BP: (130-187)/(44-67) 186/49 (01/08 0752) SpO2:  [95 %-98 %] 95 % (01/08 0455) Last BM Date : 07/07/24 General:   Alert,  Well-developed, well-nourished, pleasant and cooperative in NAD Head:  Normocephalic and atraumatic. Eyes:  Sclera clear, no icterus.  Abdomen:  Soft, nondistended. Diffuse mild tenderness mostly in upper abdomen.  Normal bowel sounds, without guarding, and without rebound.   Extremities:  Without clubbing, deformity or edema. Neurologic:  Alert and  oriented x4;  grossly normal neurologically. Skin:  Intact without significant lesions or rashes. Psych:  Alert and cooperative. Normal mood and affect.  Intake/Output from previous day: 01/07 0701 - 01/08 0700 In: 600 [P.O.:600] Out: -  Intake/Output this shift: No intake/output data recorded.  Lab Results: CBC Recent Labs    07/06/24 0420 07/08/24 0415  WBC 6.3 6.9  HGB 10.3* 9.9*  HCT 33.6* 31.4*  MCV 85.1 84.0  PLT 288 254   BMET Recent Labs    07/06/24 0420 07/08/24 0415  NA 138 135  K 4.2 3.8  CL 99 96*  CO2 26 31  GLUCOSE 109* 100*   BUN 16 17  CREATININE 0.89 0.78  CALCIUM 9.4 8.8*        Lab Results  Component Value Date   LIPASE 14 07/04/2024   Lab Results  Component Value Date   ALT 14 07/04/2024   AST 26 07/04/2024   ALKPHOS 94 07/04/2024   BILITOT 0.5 07/04/2024      Imaging Studies: CT Angio Abd/Pel W and/or Wo Contrast Result Date: 07/04/2024 EXAM: CTA ABDOMEN AND PELVIS WITHOUT AND WITH CONTRAST 07/04/2024 05:40:16 PM TECHNIQUE: CTA images of the abdomen and pelvis without and with intravenous contrast. 100 mL (iohexol  (OMNIPAQUE ) 350 MG/ML injection 100 mL IOHEXOL  350 MG/ML SOLN) was administered. Three-dimensional MIP/volume rendered formations were performed. Automated exposure control, iterative reconstruction, and/or weight based adjustment of the mA/kV was utilized to reduce the radiation dose to as low as reasonably achievable. COMPARISON: 04/25/2024 CLINICAL HISTORY: Lower GI bleed; Mesenteric ischemia, chronic. Generalized weakness for 3 days. Black stools. Negative occult fecal test. FINDINGS: VASCULATURE: AORTA: Diffuse calcification of the abdominal aorta. No acute finding. No abdominal aortic aneurysm. No dissection. CELIAC TRUNK: Stent in the celiac axis origin, which is patent. No dissection or critical occlusion identified. SUPERIOR MESENTERIC ARTERY: Stent in the superior mesenteric artery origin, which is patent. No dissection or critical occlusion identified. RENAL ARTERIES: No acute finding. No occlusion or significant stenosis. ILIAC ARTERIES: Diffuse calcification of the iliac arteries. No acute finding. No occlusion or significant stenosis. LIVER: Mild diffuse fatty infiltration  of the liver. Likely hepatic cirrhosis within the large lateral segment of the left lobe and nodular liver contour. No focal lesions. No bile duct dilatation. GALLBLADDER AND BILE DUCTS: Cholelithiasis. No biliary ductal dilatation. SPLEEN: The spleen is unremarkable. PANCREAS: The pancreas is unremarkable. ADRENAL  GLANDS: Bilateral adrenal glands demonstrate no acute abnormality. KIDNEYS, URETERS AND BLADDER: Renal nephrograms are symmetrical. No stones in the kidneys or ureters. No hydronephrosis. No perinephric or periureteral stranding. Urinary bladder is unremarkable. GI AND BOWEL: Stomach, small bowel, and colon are not abnormally distended. No wall thickening or inflammatory infiltration are demonstrated. No mesenteric hematoma. No intraluminal contrast extravasation is identified. No focal site of active gastrointestinal hemorrhage is identified. REPRODUCTIVE: Reproductive organs are unremarkable. PERITONEUM AND RETRPERITONEUM: No ascites or free air. LUNG BASE: Lung bases are clear. Cardiac enlargement. LYMPH NODES: No lymphadenopathy. BONES AND SOFT TISSUES: Degenerative changes in the spine and hips. Postoperative mesh hernia repair of the anterior abdominal wall. No acute abnormality of the bones. No acute soft tissue abnormality. IMPRESSION: 1. No evidence of active gastrointestinal hemorrhage or mesenteric ischemia. 2. Patent celiac axis and superior mesenteric artery stents. No dissection or critical occlusion identified. 3. Likely hepatic cirrhosis with nodular liver contour. No focal lesions or bile duct dilatation. Electronically signed by: Elsie Gravely MD 07/04/2024 05:52 PM EST RP Workstation: [id number]  [2 weeks]  Assessment:   [name] is a 89 y.o. 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 Also with some complaints of abdominal pain and dark stools. GI consulted for further evaluation    Burning Abdominal pain/history of mesenteric ischemia: -CT Angio with patient celiac axis and superior mesenteric artery stents. No dissection or critical occlusion identified. -reports pain onset last week, usually  occurring about 2 hours after lunch, burning in nature -some burning in chest as well -nausea, without vomiting -per chart review, reported similar symptoms at last admission, prior to her EGD in October, which is outlined above.  -reports seems to have improved with increasing PPI to BID and adding carafate . Also feels better after BM.  -she reports this pain is much different than prior to her mesenteric artery stenting.      Constipation: -No BM since Sunday -on metamucil BID at SNF and typically she finds this effective although she has been seen in the past with concern for spurious diarrhea. -recommend continue metamucil, add miralax  once to twice daily titrate to good response    Dark stools: -Recent EGD as above -FOBT negative -Hgb stable at baseline 10 range -Iron  panel with mildly low iron  -no BRBPR -low suspicion for UGIB given stable hemoglobin, recent EGD       Plan:   Monitor for overt GI bleeding Continue PPI BID Continue metamucil BID Continue miralax  once to twice daily at SNF, titrate to effective dose Carafate  1g QAC/at bedtime for 14 days Arrange for outpatient follow up.  Discussed with Dr. Evonnie, ok from GI standpoint for discharge    LOS: 0 days   Sonny RAMAN. Ezzard RIGGERS [facility] [phone] 1/8/20269:28 AM    "

## 2024-07-08 NOTE — Progress Notes (Signed)
 Mobility Specialist Progress Note:    07/08/24 1051  Mobility  Activity Ambulated with assistance  Level of Assistance Independent  Assistive Device Front wheel walker  Distance Ambulated (ft) 800 ft  Range of Motion/Exercises Active;All extremities  Activity Response Tolerated well  Mobility Referral Yes  Mobility visit 1 Mobility  Mobility Specialist Start Time (ACUTE ONLY) 1051  Mobility Specialist Stop Time (ACUTE ONLY) 1110  Mobility Specialist Time Calculation (min) (ACUTE ONLY) 19 min   Pt received in chair, agreeable to mobility. Independently able to stand and ambulate with RW. Tolerated well, asx throughout. Returned to chair, all needs met.  Wilmoth Rasnic Mobility Specialist Please contact via Special Educational Needs Teacher or  Rehab office at [phone]

## 2024-07-08 NOTE — TOC Transition Note (Signed)
 Transition of Care [facility]) - Discharge Note   Patient Details  Name: [name] MRN: [id number] Date of Birth: [birth year]  Transition [facility]) CM/SW Contact:  Lucie Lunger, LCSWA Phone Number: 07/08/2024, 12:35 PM  Clinical Narrative:    CSW updated that pt is medically stable for D/C back to [facility]F. CSW sent D/C clinicals to facility via fax for review. CSW confirmed with Kyra at facility that they were received and reviewed. Facility will send transport for pt. Pts son updated on plan for D/C back to ALF today. PT arranged through ALF in house therapy. TOC signing off.   Final next level of care: Assisted Living Barriers to Discharge: Barriers Resolved   Patient Goals and CMS Choice Patient states their goals for this hospitalization and ongoing recovery are:: return to ALF CMS Medicare.gov Compare Post Acute Care list provided to:: Patient Choice offered to / list presented to : Patient, Adult Children      Discharge Placement                Patient to be transferred to facility by: facility staff Name of family member notified: son Patient and family notified of of transfer: 07/08/24  Discharge Plan and Services Additional resources added to the After Visit Summary for   In-house Referral: Clinical Social Work Discharge Planning Services: NA Post Acute Care Choice: Resumption of Svcs/PTA Provider          DME Arranged: N/A DME Agency: NA                  Social Drivers of Health (SDOH) Interventions SDOH Screenings   Food Insecurity: No Food Insecurity (07/04/2024)  Housing: Low Risk (07/04/2024)  Transportation Needs: No Transportation Needs (07/04/2024)  Utilities: Not At Risk (07/04/2024)  Alcohol  Screen: Low Risk (09/06/2022)  Depression (PHQ2-9): Low Risk (03/03/2024)  Financial Resource Strain: Low Risk (09/06/2022)  Physical Activity: Sufficiently Active (09/06/2022)  Social Connections: Moderately Isolated (07/04/2024)  Stress: No Stress  Concern Present (09/06/2022)  Tobacco Use: Low Risk (07/04/2024)     Readmission Risk Interventions    12/28/2023    5:41 PM 09/15/2023    3:04 PM 04/08/2023    2:11 PM  Readmission Risk Prevention Plan  Transportation Screening Complete Complete Complete  PCP or Specialist Appt within 3-5 Days   Not Complete  HRI or Home Care Consult  Complete Complete  Social Work Consult for Recovery Care Planning/Counseling  Complete Complete  Palliative Care Screening  Not Applicable Not Applicable  Medication Review Oceanographer) Complete Complete Complete  PCP or Specialist appointment within 3-5 days of discharge Complete    HRI or Home Care Consult Complete    SW Recovery Care/Counseling Consult Complete    Palliative Care Screening Not Applicable    Skilled Nursing Facility Not Applicable

## 2024-07-08 NOTE — Discharge Summary (Signed)
 " Physician Discharge Summary   Patient: [name] MRN: [id number] DOB: [birth year]  Admit date:     07/04/2024  Discharge date: 07/08/2024  Discharge Physician: Alm Luvena Wentling   PCP: [name], MD   Recommendations at discharge:   Please follow up with primary care provider within 1-2 weeks  Please repeat BMP and CBC in one week     Hospital Course: 89 y.o. 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  Also endorses black stools, some bright red blood around her stool, and lightheadedness.  Denies use of NSAIDs.  Endorses exposure to influenza A at her assisted living facility.  No reported upper respiratory symptoms.   In the ER, afebrile with no leukocytosis, hypertensive.  FOBT negative.  Hemoglobin at baseline 10.4 with MCV of 82.  UA positive for pyuria.   The patient received IV Rocephin  1 g x 1 due to concern for possible UTI contributing to generalized weakness.  In the ER, the patient is too weak, she lives alone, and is too weak to go home on oral antibiotics.  Admitted by [facility], hospitalist service.   ED Course: Temperature 98.4.  BP 180/66, pulse 72, respiratory 18, O2 saturation 99% on room air.  Overall, the patient is generalized weakness improved, and she was evaluated by physical therapy.  She ambulated 100 feet with a walker with standby assist. Unfortunately, she continued to have abdominal pain, particularly postprandial.  CTA AP on 07/04/2024 was negative for active GI bleed, negative for mesenteric ischemia, with patent mesenteric and celiac artery stents.  GI was consulted to assist with management. It was felt that this may have been a combination of some GERD and constipation.  The patient was given a fleets enema, and she  had a bowel movement.  This helped with her abdominal pain.  She subsequently tolerated her diet with minimal to no pain.  Assessment and Plan:  Generalized weakness -- Improving Treating UTI IV Rocephin  ordered  Tamiflu  30 mg twice daily x 5 days, for post influenza A exposure Given IVF PT/OT evaluations>>HHPT Fall precautions   UTI UA >50 WBC Follow urine culture>>pansensitive E coli Continue Rocephin , de-escalate IV antibiotics to cefadroxil  --D/C to [facility] with 2 more days cefadroxil    Melenotic appearing stool Normocytic anemia  FOBT was negative in the ER, hemoglobin stable at 10.4 -Protonix  40 mg twice daily Hemoccult stools ordered --neg CBC in AM--Hgb remained stable in 10 range throughout the hospitalization  Anemia panel--iron  saturation 10%, ferritin 15, B12 1137, folate 14.3   Abdominal pain -07/04/24 CTA AP--neg for mesenteric ischemia, patent mesenteric art and celiac stents -case discussed with Dr. Gabrielle stent failure, no mesenteric ischemia -continue metamucil bid and add fleets enema -pt/son requested GI consult -seen by Dr. Elvan carafate , continued PPI bid -recommended bowel regimen -fleets enema given>>+BM with improvement of abd pain -tolerating diet at time of dc -norco 5/325 #6 given at time of dc   Post influenza A exposure Exposure to influenza A at her assisted living facility Pt reported one of her aides had to leave early due to flulike symptoms. Tamiflu  30 mg twice daily x 5 days for prophylaxis--2 more days after dc DuoNebs as needed and antitussives as needed   Essential hypertension Resumed home Norvasc , hydralazine , and irbesartan    History  of incomplete bladder emptying Resume home Flomax  -no urine retention issues during hospitalization   GERD PPI ordered.   Generalized anxiety Resumed home Xanax  -PDMP reviewed--xanax  0.25 mg, #30 filled 06/14/24   Prediabetes On metformin  prior to  admission -06/07/24--A1C--6.7   Polyneuropathy Resumed home gabapentin         Consultants: GI Procedures performed: none  Disposition: Home Diet recommendation:  Soft/bland DISCHARGE MEDICATION: Allergies as of 07/08/2024       Reactions   Statins Other (See Comments)   Severe myalgias   Phenergan [promethazine Hcl] Other (See Comments)   Hallucinations    Hydrocortisone Itching   Lisinopril Other (See Comments)   Lethargy, Fatigue   Prednisone  Rash   Trazodone  And Nefazodone Cough        Medication List     STOP taking these medications    EasyMax Test test strip Generic drug: glucose blood   Safety Lancets 28G Misc       TAKE these medications    acetaminophen  325 MG tablet Commonly known as: TYLENOL  TAKE (2) TABLETS BY MOUTH EVERY SIX HOURS AS NEEDED FOR PAIN. MAX 3GM IN 24 HOURS. What changed: See the new instructions.   ALPRAZolam  0.25 MG tablet Commonly known as: XANAX  Take 1 tablet (0.25 mg total) by mouth at bedtime.   amLODipine  5 MG tablet Commonly known as: NORVASC  Take 1 tablet (5 mg total) by mouth daily.   Aspirin  Low Dose 81 MG chewable tablet Generic drug: aspirin  CHEW (1) TABLET BY MOUTH ONCE DAILY. What changed: See the new instructions.   Beta Carotene Provitamin A 25000 units capsule Generic drug: beta carotene TAKE (1) CAPSULE BY MOUTH AT BEDTIME. What changed: See the new instructions.   Biofreeze Cool The Pain 4 % Gel Generic drug: Menthol  (Topical Analgesic) Apply 1 Application topically 3 (three) times daily as needed (for pain to neck/back). 1 application; topical Special Instructions: apply to back of neck three times daily due to pain. Three Times A Day 09:00 AM, 02:00 PM, 09:00 PM   cefadroxil  500 MG capsule Commonly known as: DURICEF Take 1 capsule (500 mg total) by mouth 2 (two) times daily.   cyanocobalamin  1000 MCG tablet Take 1,000 mcg by mouth at bedtime.   cycloSPORINE  0.05 % ophthalmic  emulsion Commonly known as: RESTASIS  Place 1 drop into both eyes 2 (two) times daily.   estradiol  0.01 % Crea vaginal cream Commonly known as: ESTRACE  Place 1 g vaginally every other day. **Apply one blueberry sized amount vaginally every other night for long term   feeding supplement Liqd Take 237 mLs by mouth 2 (two) times daily between meals.   gabapentin  100 MG capsule Commonly known as: NEURONTIN  Take 1 capsule (100 mg total) by mouth daily.   hydrALAZINE  25 MG tablet Commonly known as: APRESOLINE  Take 1 tablet (25 mg total) by mouth 3 (three) times daily.   HYDROcodone -acetaminophen  5-325 MG tablet Commonly known as: NORCO/VICODIN Take 1 tablet by mouth every 6 (six) hours as needed for moderate pain (pain score 4-6) or severe pain (pain score 7-10).   iVIZIA Dry Eyes 0.5 % Soln Generic drug: Povidone (PF) Place 1 drop into both eyes 2 (two) times daily.   lactobacillus Pack Take 1 packet (1 g total) by mouth 3 (three) times daily with meals.   Melatonin 10 MG Tabs Take 10 mg by mouth at bedtime.   Metamucil Smooth Texture 58.6 % powder Generic drug: psyllium One bid with water  stop lactulose What changed:  how much  to take how to take this when to take this additional instructions   metFORMIN  500 MG tablet Commonly known as: GLUCOPHAGE  TAKE (1) TABLET BY MOUTH IN THE MORNING & (1/2) TABLET (250MG ) BY MOUTH AT SUPPER What changed:  how much to take how to take this when to take this   methenamine  1 g tablet Commonly known as: HIPREX  Take 1 tablet (1 g total) by mouth 2 (two) times daily with a meal. Most effective when taken with a daily Vitamin C supplement.   ondansetron  4 MG tablet Commonly known as: ZOFRAN  Take 1 tablet (4 mg total) by mouth every 6 (six) hours as needed for nausea or vomiting.   oseltamivir  30 MG capsule Commonly known as: TAMIFLU  Take 1 capsule (30 mg total) by mouth 2 (two) times daily.   pantoprazole  40 MG tablet Commonly  known as: PROTONIX  Take 1 tablet (40 mg total) by mouth 2 (two) times daily. What changed: when to take this   polyethylene glycol 17 g packet Commonly known as: MIRALAX  / GLYCOLAX  Take 17 g by mouth daily as needed for moderate constipation.   PreserVision AREDS 2 Caps Take 1 capsule by mouth in the morning and at bedtime.   sodium chloride  1 g tablet One qd What changed:  how much to take how to take this when to take this   sucralfate  1 GM/10ML suspension Commonly known as: CARAFATE  Take 10 mLs (1 g total) by mouth 4 (four) times daily -  with meals and at bedtime.   tamsulosin  0.4 MG Caps capsule Commonly known as: FLOMAX  Take 1 capsule (0.4 mg total) by mouth daily.   valsartan  40 MG tablet Commonly known as: DIOVAN  Take 1 tablet (40 mg total) by mouth daily.   vitamin D3 25 MCG tablet Commonly known as: CHOLECALCIFEROL  Take 1 tablet (1,000 Units total) by mouth daily.        Discharge Exam: Filed Weights   07/04/24 1539 07/04/24 2049  Weight: 77.1 kg 77.4 kg   HEENT:  [place]/AT, No thrush, no icterus CV:  RRR, no rub, no S3, no S4 Lung:  CTA, no wheeze, no rhonchi Abd:  soft/+BS, NT Ext:  No edema, no lymphangitis, no synovitis, no rash   Condition at discharge: stable  The results of significant diagnostics from this hospitalization (including imaging, microbiology, ancillary and laboratory) are listed below for reference.   Imaging Studies: CT Angio Abd/Pel W and/or Wo Contrast Result Date: 07/04/2024 EXAM: CTA ABDOMEN AND PELVIS WITHOUT AND WITH CONTRAST 07/04/2024 05:40:16 PM TECHNIQUE: CTA images of the abdomen and pelvis without and with intravenous contrast. 100 mL (iohexol  (OMNIPAQUE ) 350 MG/ML injection 100 mL IOHEXOL  350 MG/ML SOLN) was administered. Three-dimensional MIP/volume rendered formations were performed. Automated exposure control, iterative reconstruction, and/or weight based adjustment of the mA/kV was utilized to reduce the radiation  dose to as low as reasonably achievable. COMPARISON: 04/25/2024 CLINICAL HISTORY: Lower GI bleed; Mesenteric ischemia, chronic. Generalized weakness for 3 days. Black stools. Negative occult fecal test. FINDINGS: VASCULATURE: AORTA: Diffuse calcification of the abdominal aorta. No acute finding. No abdominal aortic aneurysm. No dissection. CELIAC TRUNK: Stent in the celiac axis origin, which is patent. No dissection or critical occlusion identified. SUPERIOR MESENTERIC ARTERY: Stent in the superior mesenteric artery origin, which is patent. No dissection or critical occlusion identified. RENAL ARTERIES: No acute finding. No occlusion or significant stenosis. ILIAC ARTERIES: Diffuse calcification of the iliac arteries. No acute finding. No occlusion or significant stenosis. LIVER: Mild diffuse fatty infiltration  of the liver. Likely hepatic cirrhosis within the large lateral segment of the left lobe and nodular liver contour. No focal lesions. No bile duct dilatation. GALLBLADDER AND BILE DUCTS: Cholelithiasis. No biliary ductal dilatation. SPLEEN: The spleen is unremarkable. PANCREAS: The pancreas is unremarkable. ADRENAL GLANDS: Bilateral adrenal glands demonstrate no acute abnormality. KIDNEYS, URETERS AND BLADDER: Renal nephrograms are symmetrical. No stones in the kidneys or ureters. No hydronephrosis. No perinephric or periureteral stranding. Urinary bladder is unremarkable. GI AND BOWEL: Stomach, small bowel, and colon are not abnormally distended. No wall thickening or inflammatory infiltration are demonstrated. No mesenteric hematoma. No intraluminal contrast extravasation is identified. No focal site of active gastrointestinal hemorrhage is identified. REPRODUCTIVE: Reproductive organs are unremarkable. PERITONEUM AND RETRPERITONEUM: No ascites or free air. LUNG BASE: Lung bases are clear. Cardiac enlargement. LYMPH NODES: No lymphadenopathy. BONES AND SOFT TISSUES: Degenerative changes in the spine and  hips. Postoperative mesh hernia repair of the anterior abdominal wall. No acute abnormality of the bones. No acute soft tissue abnormality. IMPRESSION: 1. No evidence of active gastrointestinal hemorrhage or mesenteric ischemia. 2. Patent celiac axis and superior mesenteric artery stents. No dissection or critical occlusion identified. 3. Likely hepatic cirrhosis with nodular liver contour. No focal lesions or bile duct dilatation. Electronically signed by: Elsie Gravely MD 07/04/2024 05:52 PM EST RP Workstation: [id number]    Microbiology: Results for orders placed or performed during the hospital encounter of 07/04/24  Urine Culture     Status: Abnormal   Collection Time: 07/04/24  4:09 PM   Specimen: Urine, Random  Result Value Ref Range Status   Specimen Description   Final    URINE, RANDOM Performed at [facility], [address], [place], [place] [address]    Special Requests   Final    NONE Reflexed from [id number] Performed at [facility], [address], [place], [place] [address]    Culture >=100,000 COLONIES/mL ESCHERICHIA COLI (A)  Final   Report Status 07/07/2024 FINAL  Final   Organism ID, Bacteria ESCHERICHIA COLI (A)  Final      Susceptibility   Escherichia coli - MIC*    AMPICILLIN  <=2 SENSITIVE Sensitive     CEFAZOLIN  (URINE) Value in next row Sensitive      <=1 SENSITIVEThis is a modified FDA-approved test that has been validated and its performance characteristics determined by the reporting laboratory.  This laboratory is certified under the Clinical Laboratory Improvement Amendments CLIA as qualified to perform high complexity clinical laboratory testing.    CEFEPIME  Value in next row Sensitive      <=1 SENSITIVEThis is a modified FDA-approved test that has been validated and its performance characteristics determined by the reporting laboratory.  This laboratory is certified under the Clinical Laboratory Improvement Amendments CLIA as qualified to perform high  complexity clinical laboratory testing.    ERTAPENEM Value in next row Sensitive      <=1 SENSITIVEThis is a modified FDA-approved test that has been validated and its performance characteristics determined by the reporting laboratory.  This laboratory is certified under the Clinical Laboratory Improvement Amendments CLIA as qualified to perform high complexity clinical laboratory testing.    CEFTRIAXONE  Value in next row Sensitive      <=1 SENSITIVEThis is a modified FDA-approved test that has been validated and its performance characteristics determined by the reporting laboratory.  This laboratory is certified under the Clinical Laboratory Improvement Amendments CLIA as qualified to perform high complexity clinical laboratory testing.    CIPROFLOXACIN  Value in next  row Sensitive      <=1 SENSITIVEThis is a modified FDA-approved test that has been validated and its performance characteristics determined by the reporting laboratory.  This laboratory is certified under the Clinical Laboratory Improvement Amendments CLIA as qualified to perform high complexity clinical laboratory testing.    GENTAMICIN Value in next row Sensitive      <=1 SENSITIVEThis is a modified FDA-approved test that has been validated and its performance characteristics determined by the reporting laboratory.  This laboratory is certified under the Clinical Laboratory Improvement Amendments CLIA as qualified to perform high complexity clinical laboratory testing.    NITROFURANTOIN  Value in next row Sensitive      <=1 SENSITIVEThis is a modified FDA-approved test that has been validated and its performance characteristics determined by the reporting laboratory.  This laboratory is certified under the Clinical Laboratory Improvement Amendments CLIA as qualified to perform high complexity clinical laboratory testing.    TRIMETH /SULFA  Value in next row Sensitive      <=1 SENSITIVEThis is a modified FDA-approved test that has been  validated and its performance characteristics determined by the reporting laboratory.  This laboratory is certified under the Clinical Laboratory Improvement Amendments CLIA as qualified to perform high complexity clinical laboratory testing.    AMPICILLIN /SULBACTAM Value in next row Sensitive      <=1 SENSITIVEThis is a modified FDA-approved test that has been validated and its performance characteristics determined by the reporting laboratory.  This laboratory is certified under the Clinical Laboratory Improvement Amendments CLIA as qualified to perform high complexity clinical laboratory testing.    PIP/TAZO Value in next row Sensitive      <=4 SENSITIVEThis is a modified FDA-approved test that has been validated and its performance characteristics determined by the reporting laboratory.  This laboratory is certified under the Clinical Laboratory Improvement Amendments CLIA as qualified to perform high complexity clinical laboratory testing.    MEROPENEM Value in next row Sensitive      <=4 SENSITIVEThis is a modified FDA-approved test that has been validated and its performance characteristics determined by the reporting laboratory.  This laboratory is certified under the Clinical Laboratory Improvement Amendments CLIA as qualified to perform high complexity clinical laboratory testing.    * >=100,000 COLONIES/mL ESCHERICHIA COLI    Labs: CBC: Recent Labs  Lab 07/04/24 1630 07/06/24 0420 07/08/24 0415  WBC 7.0 6.3 6.9  NEUTROABS 4.8  --   --   HGB 10.4* 10.3* 9.9*  HCT 32.9* 33.6* 31.4*  MCV 82.9 85.1 84.0  PLT 274 288 254   Basic Metabolic Panel: Recent Labs  Lab 07/04/24 1630 07/06/24 0420 07/08/24 0415  NA 136 138 135  K 4.2 4.2 3.8  CL 99 99 96*  CO2 23 26 31   GLUCOSE 116* 109* 100*  BUN 17 16 17   CREATININE 0.77 0.89 0.78  CALCIUM 9.2 9.4 8.8*  MG  --   --  2.2   Liver Function Tests: Recent Labs  Lab 07/04/24 1630  AST 26  ALT 14  ALKPHOS 94  BILITOT 0.5   PROT 6.4*  ALBUMIN 4.0   CBG: No results for input(s): GLUCAP in the last 168 hours.  Discharge time spent: greater than 30 minutes.  Signed: Alm Schneider, MD [facility] 07/08/2024 "

## 2024-07-08 NOTE — NC FL2 (Signed)
 " [place]  MEDICAID FL2 LEVEL OF CARE FORM     IDENTIFICATION  Patient Name: [name] Birthdate: [birth year] Sex: female Admission Date (Current Location): 07/04/2024  [facility] and [place] Number:  [employer] and Address:  [facility],  [address], [place] [address]      Provider Number: [id number]  Attending Physician Name and Address:  Evonnie Lenis, MD  Relative Name and Phone Number:  Alaia, Lordi, Emergency Contact  [phone]    Current Level of Care: Hospital Recommended Level of Care: Assisted Living Facility Prior Approval Number:    Date Approved/Denied:   PASRR Number:    Discharge Plan: Domiciliary (Rest home) (ALF)    Current Diagnoses: Patient Active Problem List   Diagnosis Date Noted   Other constipation 07/08/2024   Chronic kidney disease, stage 3a (HCC) 07/07/2024   Generalized weakness 07/04/2024   Papule of skin 04/27/2024   Mesenteric ischemia 04/12/2024   DNR (do not resuscitate) discussion 04/01/2024   Occlusive mesenteric ischemia 03/31/2024   Palliative care by specialist 03/29/2024   DNR (do not resuscitate) 03/29/2024   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   Skin rash 03/24/2024   Lesion of nose 03/24/2024   History of Clostridioides difficile colitis 02/18/2024   Overflow diarrhea 02/18/2024   Abnormal findings on diagnostic imaging of other specified body structures 02/18/2024   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/15/2024   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 01/15/2024   Recurrent UTI 01/15/2024   Type 2 diabetes mellitus with hyperglycemia (HCC) 12/28/2023   UTI (urinary tract infection) 12/27/2023   Frequency of urination 12/24/2023   Acute blood loss anemia 04/17/2023   Gastritis and gastroduodenitis 04/10/2023   Abdominal pain, chronic, epigastric 04/08/2023   Mesenteric artery stenosis 04/07/2023   Diabetic neuropathy (HCC) 11/30/2022   S/P  total knee arthroplasty, right 04/12/2022   Hypertension associated with type 2 diabetes mellitus (HCC) 02/25/2022   Aortic atherosclerosis 02/25/2022   Chronic non-seasonal allergic rhinitis 02/25/2022   GERD (gastroesophageal reflux disease) 02/17/2022   Myalgia due to statin 02/15/2020   Cognitive dysfunction 03/04/2019   Generalized anxiety disorder 06/05/2018   Hyponatremia 03/08/2018   Osteoarthritis of right knee 12/02/2017   Hyperlipidemia associated with type 2 diabetes mellitus (HCC) 10/02/2016   Insomnia 10/02/2016   Essential hypertension 12/15/2014   Type 2 diabetes mellitus with atherosclerosis of aorta (HCC) 02/15/2011    Orientation RESPIRATION BLADDER Height & Weight     Self, Time, Situation, Place  Normal Continent Weight: 170 lb 9.6 oz (77.4 kg) Height:  5' 8 (172.7 cm)  BEHAVIORAL SYMPTOMS/MOOD NEUROLOGICAL BOWEL NUTRITION STATUS      Continent Diet (See DC summary)  AMBULATORY STATUS COMMUNICATION OF NEEDS Skin   Limited Assist Verbally Normal                       Personal Care Assistance Level of Assistance  Bathing, Feeding, Dressing Bathing Assistance: Limited assistance Feeding assistance: Independent Dressing Assistance: Limited assistance     Functional Limitations Info  Sight, Hearing, Speech Sight Info: Impaired (eyeglasses) Hearing Info: Adequate Speech Info: Adequate    SPECIAL CARE FACTORS FREQUENCY  PT (By licensed PT)     PT Frequency: [facility] outpatient PT              Contractures Contractures Info: Not present    Additional Factors Info  Code Status, Allergies, Psychotropic Code Status Info: DNR Allergies Info: Statins, Phenergan (  Promethazine Hcl), Hydrocortisone, Lisinopril, Prednisone , Trazodone  And Nefazodone Psychotropic Info: See MAR         Current Medications (07/08/2024):  This is the current hospital active medication list Current Facility-Administered Medications  Medication Dose Route Frequency  Provider Last Rate Last Admin   acetaminophen  (TYLENOL ) tablet 500 mg  500 mg Oral Q6H PRN Hall, Carole N, DO   500 mg at 07/06/24 1616   ALPRAZolam  (XANAX ) tablet 0.25 mg  0.25 mg Oral QHS Shona Laurence N, DO   0.25 mg at 07/07/24 2035   amLODipine  (NORVASC ) tablet 5 mg  5 mg Oral Daily Hall, Carole N, DO   5 mg at 07/08/24 0845   artificial tears ophthalmic solution 1 drop  1 drop Both Eyes BID Shona Laurence N, DO   1 drop at 07/08/24 9153   aspirin  chewable tablet 81 mg  81 mg Oral Daily Shona Laurence SAILOR, DO   81 mg at 07/08/24 0845   cefadroxil  (DURICEF) capsule 500 mg  500 mg Oral BID Evonnie Lenis, MD   500 mg at 07/08/24 0844   cholecalciferol  (VITAMIN D3) 25 MCG (1000 UNIT) tablet 1,000 Units  1,000 Units Oral Daily Vicci, Clanford L, MD   1,000 Units at 07/08/24 0845   cyanocobalamin  (VITAMIN B12) tablet 1,000 mcg  1,000 mcg Oral QHS Shona Laurence N, DO   1,000 mcg at 07/07/24 2030   cycloSPORINE  (RESTASIS ) 0.05 % ophthalmic emulsion 1 drop  1 drop Both Eyes BID Shona Laurence N, DO   1 drop at 07/08/24 0847   enoxaparin  (LOVENOX ) injection 40 mg  40 mg Subcutaneous Q24H Hall, Carole N, DO   40 mg at 07/07/24 2040   feeding supplement (ENSURE PLUS HIGH PROTEIN) liquid 237 mL  237 mL Oral BID BM Shona Laurence N, DO   237 mL at 07/08/24 0848   gabapentin  (NEURONTIN ) capsule 100 mg  100 mg Oral QHS Shona Laurence N, DO   100 mg at 07/07/24 2032   hydrALAZINE  (APRESOLINE ) tablet 25 mg  25 mg Oral TID Shona Laurence SAILOR, DO   25 mg at 07/08/24 0847   HYDROcodone -acetaminophen  (NORCO/VICODIN) 5-325 MG per tablet 1-2 tablet  1-2 tablet Oral Q4H PRN Johnson, Clanford L, MD   2 tablet at 07/07/24 2113   irbesartan  (AVAPRO ) tablet 37.5 mg  37.5 mg Oral Daily Shona Laurence N, DO   37.5 mg at 07/08/24 0844   lactobacillus (FLORANEX/LACTINEX) granules 1 g  1 g Oral TID WC Shona Laurence N, DO   1 g at 07/08/24 0845   melatonin tablet 6 mg  6 mg Oral QHS PRN Shona Laurence N, DO   6 mg at 07/06/24 2139   Muscle Rub CREA    Topical TID Shona Laurence SAILOR, DO   Given at 07/08/24 9152   ondansetron  (ZOFRAN ) injection 4 mg  4 mg Intravenous Q6H PRN Shona Laurence SAILOR, DO       oseltamivir  (TAMIFLU ) capsule 30 mg  30 mg Oral BID Shona Laurence N, DO   30 mg at 07/08/24 0844   pantoprazole  (PROTONIX ) EC tablet 40 mg  40 mg Oral BID Carlan, Chelsea L, NP   40 mg at 07/08/24 0845   polyethylene glycol (MIRALAX  / GLYCOLAX ) packet 17 g  17 g Oral Daily PRN Shona Laurence N, DO   17 g at 07/06/24 1606   polyethylene glycol (MIRALAX  / GLYCOLAX ) packet 17 g  17 g Oral BID Evonnie Lenis, MD   17 g at 07/08/24 [phone]  senna-docusate (Senokot-S) tablet 1 tablet  1 tablet Oral QHS PRN Vicci, Clanford L, MD   1 tablet at 07/06/24 2139   sodium chloride  tablet 1 g  1 g Oral q morning Johnson, Clanford L, MD   1 g at 07/08/24 0845   sucralfate  (CARAFATE ) 1 GM/10ML suspension 1 g  1 g Oral TID WC & HS Carlan, Chelsea L, NP   1 g at 07/08/24 0844   tamsulosin  (FLOMAX ) capsule 0.4 mg  0.4 mg Oral Daily Shona Laurence N, DO   0.4 mg at 07/08/24 0908     Discharge Medications: Allergies as of 07/08/2024       Reactions   Statins Other (See Comments)   Severe myalgias   Phenergan [promethazine Hcl] Other (See Comments)   Hallucinations    Hydrocortisone Itching   Lisinopril Other (See Comments)   Lethargy, Fatigue   Prednisone  Rash   Trazodone  And Nefazodone Cough        Medication List     STOP taking these medications    EasyMax Test test strip Generic drug: glucose blood   Safety Lancets 28G Misc       TAKE these medications    acetaminophen  325 MG tablet Commonly known as: TYLENOL  TAKE (2) TABLETS BY MOUTH EVERY SIX HOURS AS NEEDED FOR PAIN. MAX 3GM IN 24 HOURS. What changed: See the new instructions.   ALPRAZolam  0.25 MG tablet Commonly known as: XANAX  Take 1 tablet (0.25 mg total) by mouth at bedtime.   amLODipine  5 MG tablet Commonly known as: NORVASC  Take 1 tablet (5 mg total) by mouth daily.   Aspirin  Low Dose  81 MG chewable tablet Generic drug: aspirin  CHEW (1) TABLET BY MOUTH ONCE DAILY. What changed: See the new instructions.   Beta Carotene Provitamin A 25000 units capsule Generic drug: beta carotene TAKE (1) CAPSULE BY MOUTH AT BEDTIME. What changed: See the new instructions.   Biofreeze Cool The Pain 4 % Gel Generic drug: Menthol  (Topical Analgesic) Apply 1 Application topically 3 (three) times daily as needed (for pain to neck/back). 1 application; topical Special Instructions: apply to back of neck three times daily due to pain. Three Times A Day 09:00 AM, 02:00 PM, 09:00 PM   cefadroxil  500 MG capsule Commonly known as: DURICEF Take 1 capsule (500 mg total) by mouth 2 (two) times daily.   cyanocobalamin  1000 MCG tablet Take 1,000 mcg by mouth at bedtime.   cycloSPORINE  0.05 % ophthalmic emulsion Commonly known as: RESTASIS  Place 1 drop into both eyes 2 (two) times daily.   estradiol  0.01 % Crea vaginal cream Commonly known as: ESTRACE  Place 1 g vaginally every other day. **Apply one blueberry sized amount vaginally every other night for long term   feeding supplement Liqd Take 237 mLs by mouth 2 (two) times daily between meals.   gabapentin  100 MG capsule Commonly known as: NEURONTIN  Take 1 capsule (100 mg total) by mouth daily.   hydrALAZINE  25 MG tablet Commonly known as: APRESOLINE  Take 1 tablet (25 mg total) by mouth 3 (three) times daily.   HYDROcodone -acetaminophen  5-325 MG tablet Commonly known as: NORCO/VICODIN Take 1 tablet by mouth every 6 (six) hours as needed for moderate pain (pain score 4-6) or severe pain (pain score 7-10).   iVIZIA Dry Eyes 0.5 % Soln Generic drug: Povidone (PF) Place 1 drop into both eyes 2 (two) times daily.   lactobacillus Pack Take 1 packet (1 g total) by mouth 3 (three) times daily with meals.  Melatonin 10 MG Tabs Take 10 mg by mouth at bedtime.   Metamucil Smooth Texture 58.6 % powder Generic drug: psyllium One bid  with water  stop lactulose What changed:  how much to take how to take this when to take this additional instructions   metFORMIN  500 MG tablet Commonly known as: GLUCOPHAGE  TAKE (1) TABLET BY MOUTH IN THE MORNING & (1/2) TABLET (250MG ) BY MOUTH AT SUPPER What changed:  how much to take how to take this when to take this   methenamine  1 g tablet Commonly known as: HIPREX  Take 1 tablet (1 g total) by mouth 2 (two) times daily with a meal. Most effective when taken with a daily Vitamin C supplement.   ondansetron  4 MG tablet Commonly known as: ZOFRAN  Take 1 tablet (4 mg total) by mouth every 6 (six) hours as needed for nausea or vomiting.   oseltamivir  30 MG capsule Commonly known as: TAMIFLU  Take 1 capsule (30 mg total) by mouth 2 (two) times daily.   pantoprazole  40 MG tablet Commonly known as: PROTONIX  Take 1 tablet (40 mg total) by mouth 2 (two) times daily. What changed: when to take this   polyethylene glycol 17 g packet Commonly known as: MIRALAX  / GLYCOLAX  Take 17 g by mouth daily as needed for moderate constipation.   PreserVision AREDS 2 Caps Take 1 capsule by mouth in the morning and at bedtime.   sodium chloride  1 g tablet One qd What changed:  how much to take how to take this when to take this   sucralfate  1 GM/10ML suspension Commonly known as: CARAFATE  Take 10 mLs (1 g total) by mouth 4 (four) times daily -  with meals and at bedtime.   tamsulosin  0.4 MG Caps capsule Commonly known as: FLOMAX  Take 1 capsule (0.4 mg total) by mouth daily.   valsartan  40 MG tablet Commonly known as: DIOVAN  Take 1 tablet (40 mg total) by mouth daily.   vitamin D3 25 MCG tablet Commonly known as: CHOLECALCIFEROL  Take 1 tablet (1,000 Units total) by mouth daily.         Relevant Imaging Results:  Relevant Lab Results:   Additional Information SSN: [id number]  Lucie Lunger, LCSWA     "

## 2024-07-08 NOTE — Telephone Encounter (Signed)
 Please arrange for hospital follow up in 2-3 weeks with Dr. Cinderella (patient request).

## 2024-07-09 ENCOUNTER — Other Ambulatory Visit: Payer: Self-pay | Admitting: Family Medicine

## 2024-07-13 ENCOUNTER — Other Ambulatory Visit (HOSPITAL_COMMUNITY): Payer: Self-pay

## 2024-07-13 ENCOUNTER — Telehealth: Payer: Self-pay | Admitting: Pharmacy Technician

## 2024-07-13 ENCOUNTER — Encounter: Payer: Self-pay | Admitting: Family Medicine

## 2024-07-13 ENCOUNTER — Ambulatory Visit (INDEPENDENT_AMBULATORY_CARE_PROVIDER_SITE_OTHER): Admitting: Family Medicine

## 2024-07-13 VITALS — BP 150/60 | HR 87 | Temp 97.7°F | Ht 68.0 in | Wt 174.0 lb

## 2024-07-13 DIAGNOSIS — N3000 Acute cystitis without hematuria: Secondary | ICD-10-CM | POA: Diagnosis not present

## 2024-07-13 DIAGNOSIS — K219 Gastro-esophageal reflux disease without esophagitis: Secondary | ICD-10-CM | POA: Diagnosis not present

## 2024-07-13 MED ORDER — SUCRALFATE 1 G PO TABS: 1.0000 g | ORAL_TABLET | Freq: Three times a day (TID) | ORAL | 3 refills | Status: AC

## 2024-07-13 NOTE — Telephone Encounter (Signed)
 Faxed over directions to [facility] as indicated

## 2024-07-13 NOTE — Telephone Encounter (Signed)
 Pharmacy Patient Advocate Encounter   Received notification from [facility] KEY that prior authorization for Sucralfate  1GM/10ML susp. is required/requested.   Insurance verification completed.   The patient is insured through [facility] Medicare.   Per test claim:  Sucralfate  1GM tablets is preferred by the insurance.  If suggested medication is appropriate, Please send in a new RX and discontinue this one. If not, please advise as to why it's not appropriate so that we may request a Prior Authorization. Please note, some preferred medications may still require a PA.  If the suggested medications have not been trialed and there are no contraindications to their use, the PA will not be submitted, as it will not be approved.   CMM KEY: [id number] archived

## 2024-07-13 NOTE — Telephone Encounter (Signed)
 Dr. Bluford saw the patient today A prescription for the tablets was sent The tablet can easily be crushed and dissolved in a tablespoon or 2 tablespoons of water  and be given at mealtime Please call out to [facility] and let them know that this could be done  (I would not recommend trying to get the liquid Carafate  approved through prior approval-thank you)

## 2024-07-13 NOTE — Progress Notes (Unsigned)
 "  Subjective:  Patient ID: [name], female    DOB: 02-17-1932  Age: 89 y.o. MRN: [id number]  CC:   Chief Complaint  Patient presents with   Hospitalization Follow-up    Pt requests medication for reflux , pt is waiting on carafate  possible PA prescribed by hosp. Was also treated for flu and UTI  Needs lactobacillus D/C'd she pays out of pocket    HPI:  89 year old female presents for hospital follow-up.  No TCM call was initiated.  Hospital course, discharge summary reviewed.  Patient presented with generalized weakness.  Also endorsed black stools and some bright red blood per rectum.  Patient was found to have urinary tract infection but given her weakness she was unable to be discharged in the ER and was subsequently admitted.  Additionally, patient had been exposed to influenza so she was treated for influenza as well.  Developed significant abdominal pain during admission and was started on Carafate .  Patient presents today for follow-up.  She states that she is still not feeling her best.  Has not gotten Carafate  due to issues with prior authorization.  Patient states that she is paying out-of-pocket for a probiotic.  Will plan to discontinue today.  Patient Active Problem List   Diagnosis Date Noted   Other constipation 07/08/2024   Pain of upper abdomen 07/08/2024   Chronic kidney disease, stage 3a (HCC) 07/07/2024   Generalized weakness 07/04/2024   Papule of skin 04/27/2024   Mesenteric ischemia 04/12/2024   DNR (do not resuscitate) discussion 04/01/2024   Occlusive mesenteric ischemia 03/31/2024   Palliative care by specialist 03/29/2024   DNR (do not resuscitate) 03/29/2024   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   Skin rash 03/24/2024   Lesion of nose 03/24/2024   History of Clostridioides difficile colitis 02/18/2024   Overflow diarrhea 02/18/2024   Abnormal findings on diagnostic imaging of other specified body structures 02/18/2024   History of  infection with vancomycin  resistant Enterococcus (VRE) 01/15/2024   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 01/15/2024   Recurrent UTI 01/15/2024   Type 2 diabetes mellitus with hyperglycemia (HCC) 12/28/2023   UTI (urinary tract infection) 12/27/2023   Frequency of urination 12/24/2023   Acute blood loss anemia 04/17/2023   Gastritis and gastroduodenitis 04/10/2023   Abdominal pain, chronic, epigastric 04/08/2023   Mesenteric artery stenosis 04/07/2023   Diabetic neuropathy (HCC) 11/30/2022   S/P total knee arthroplasty, right 04/12/2022   Hypertension associated with type 2 diabetes mellitus (HCC) 02/25/2022   Aortic atherosclerosis 02/25/2022   Chronic non-seasonal allergic rhinitis 02/25/2022   GERD (gastroesophageal reflux disease) 02/17/2022   Myalgia due to statin 02/15/2020   Cognitive dysfunction 03/04/2019   Generalized anxiety disorder 06/05/2018   Hyponatremia 03/08/2018   Osteoarthritis of right knee 12/02/2017   Hyperlipidemia associated with type 2 diabetes mellitus (HCC) 10/02/2016   Insomnia 10/02/2016   Essential hypertension 12/15/2014   Type 2 diabetes mellitus with atherosclerosis of aorta (HCC) 02/15/2011    Social Hx   Social History   Socioeconomic History   Marital status: Widowed    Spouse name: Not on file   Number of children: Not on file   Years of education: Not on file   Highest education level: Not on file  Occupational History   Not on file  Tobacco Use   Smoking status: Never   Smokeless tobacco: Never  Vaping Use   Vaping status: Never Used  Substance and Sexual Activity   Alcohol  use: No  Drug use: No   Sexual activity: Not Currently    Birth control/protection: Abstinence  Other Topics Concern   Not on file  Social History Narrative   Not on file   Social Drivers of Health   Tobacco Use: Low Risk (07/13/2024)   Patient History    Smoking Tobacco Use: Never     Smokeless Tobacco Use: Never    Passive Exposure: Not on file  Financial Resource Strain: Low Risk (09/06/2022)   Overall Financial Resource Strain (CARDIA)    Difficulty of Paying Living Expenses: Not hard at all  Food Insecurity: No Food Insecurity (07/04/2024)   Epic    Worried About Radiation Protection Practitioner of Food in the Last Year: Never true    Ran Out of Food in the Last Year: Never true  Transportation Needs: No Transportation Needs (07/04/2024)   Epic    Lack of Transportation (Medical): No    Lack of Transportation (Non-Medical): No  Physical Activity: Sufficiently Active (09/06/2022)   Exercise Vital Sign    Days of Exercise per Week: 5 days    Minutes of Exercise per Session: 30 min  Stress: No Stress Concern Present (09/06/2022)   [employer] of Occupational Health - Occupational Stress Questionnaire    Feeling of Stress : Not at all  Social Connections: Moderately Isolated (07/04/2024)   Social Connection and Isolation Panel    Frequency of Communication with Friends and Family: More than three times a week    Frequency of Social Gatherings with Friends and Family: More than three times a week    Attends Religious Services: More than 4 times per year    Active Member of [employer] or Organizations: No    Attends Banker Meetings: Never    Marital Status: Widowed  Depression (PHQ2-9): Low Risk (07/13/2024)   Depression (PHQ2-9)    PHQ-2 Score: 0  Alcohol  Screen: Low Risk (09/06/2022)   Alcohol  Screen    Last Alcohol  Screening Score (AUDIT): 0  Housing: Low Risk (07/04/2024)   Epic    Unable to Pay for Housing in the Last Year: No    Number of Times Moved in the Last Year: 0    Homeless in the Last Year: No  Utilities: Not At Risk (07/04/2024)   Epic    Threatened with loss of utilities: No  Health Literacy: Not on file    Review of Systems Per HPI  Objective:  BP (!) 150/60   Pulse 87   Temp 97.7 F (36.5 C)   Ht 5' 8 (1.727 m)   Wt 174 lb (78.9  kg)   SpO2 98%   BMI 26.46 kg/m      07/13/2024   11:49 AM 07/13/2024   11:13 AM 07/08/2024    7:52 AM  BP/Weight  Systolic BP 150 177 186  Diastolic BP 60 82 49  Wt. (Lbs)  174   BMI  26.46 kg/m2     Physical Exam  Lab Results  Component Value Date   WBC 6.9 07/08/2024   HGB 9.9 (L) 07/08/2024   HCT 31.4 (L) 07/08/2024   PLT 254 07/08/2024   GLUCOSE 100 (H) 07/08/2024   CHOL 163 03/27/2024   TRIG 144 03/27/2024   HDL 43 03/27/2024   LDLCALC 91 03/27/2024   ALT 14 07/04/2024   AST 26 07/04/2024   NA 135 07/08/2024   K 3.8 07/08/2024   CL 96 (L) 07/08/2024   CREATININE 0.78 07/08/2024   BUN 17 07/08/2024  CO2 31 07/08/2024   TSH 1.792 02/10/2024   INR 1.0 03/28/2024   HGBA1C 6.7 (H) 06/07/2024   MICROALBUR 1.51 11/18/2013     Assessment & Plan:  There are no diagnoses linked to this encounter.  Follow-up:  No follow-ups on file.  Jacqulyn Ahle DO [facility] "

## 2024-07-13 NOTE — Patient Instructions (Signed)
 Carafate  sent in.  Discontinue probiotic.  Follow up with Dr. Glendia.

## 2024-07-14 NOTE — Assessment & Plan Note (Addendum)
 Recently seen by GI during hospitalization.  Uncontrolled/exacerbation.  Adding Carafate .

## 2024-07-14 NOTE — Assessment & Plan Note (Addendum)
 Improving.  Finishing antibiotic therapy.

## 2024-07-15 NOTE — Telephone Encounter (Signed)
 Devere, you could offer her appt with APP for initial post hospital follow up to get her in sooner. If she prefers to wait till March to have appt with Dr. Cinderella that is fine if she is doing ok.

## 2024-07-16 ENCOUNTER — Other Ambulatory Visit: Payer: Self-pay | Admitting: Physician Assistant

## 2024-07-16 DIAGNOSIS — G4709 Other insomnia: Secondary | ICD-10-CM

## 2024-07-21 ENCOUNTER — Ambulatory Visit: Admitting: Family Medicine

## 2024-07-21 ENCOUNTER — Ambulatory Visit: Payer: Self-pay | Admitting: Family Medicine

## 2024-07-21 VITALS — BP 158/70 | HR 92 | Temp 98.2°F | Ht 68.0 in | Wt 176.0 lb

## 2024-07-21 DIAGNOSIS — D509 Iron deficiency anemia, unspecified: Secondary | ICD-10-CM

## 2024-07-21 DIAGNOSIS — E119 Type 2 diabetes mellitus without complications: Secondary | ICD-10-CM | POA: Diagnosis not present

## 2024-07-21 DIAGNOSIS — R300 Dysuria: Secondary | ICD-10-CM

## 2024-07-21 DIAGNOSIS — I10 Essential (primary) hypertension: Secondary | ICD-10-CM

## 2024-07-21 DIAGNOSIS — C44311 Basal cell carcinoma of skin of nose: Secondary | ICD-10-CM | POA: Diagnosis not present

## 2024-07-21 DIAGNOSIS — R339 Retention of urine, unspecified: Secondary | ICD-10-CM

## 2024-07-21 LAB — POCT URINALYSIS DIPSTICK
Bilirubin, UA: NEGATIVE
Blood, UA: NEGATIVE
Glucose, UA: NEGATIVE
Ketones, UA: NEGATIVE
Nitrite, UA: NEGATIVE
Protein, UA: NEGATIVE
Spec Grav, UA: <=1.005 — AB
Urobilinogen, UA: NEGATIVE U/dL — AB
pH, UA: 5

## 2024-07-21 MED ORDER — AMLODIPINE BESYLATE 10 MG PO TABS: 10.0000 mg | ORAL_TABLET | Freq: Every day | ORAL | 2 refills | Status: AC

## 2024-07-21 MED ORDER — VALSARTAN 40 MG PO TABS: 40.0000 mg | ORAL_TABLET | Freq: Every day | ORAL | 6 refills | Status: AC

## 2024-07-21 MED ORDER — GABAPENTIN 100 MG PO CAPS: 100.0000 mg | ORAL_CAPSULE | Freq: Every day | ORAL | 5 refills | Status: AC

## 2024-07-21 MED ORDER — PANTOPRAZOLE SODIUM 40 MG PO TBEC: 40.0000 mg | DELAYED_RELEASE_TABLET | Freq: Two times a day (BID) | ORAL | 6 refills | Status: AC

## 2024-07-21 MED ORDER — CEPHALEXIN 500 MG PO CAPS: 500.0000 mg | ORAL_CAPSULE | Freq: Three times a day (TID) | ORAL | 0 refills | Status: DC

## 2024-07-21 MED ORDER — ALPRAZOLAM 0.25 MG PO TABS: 0.2500 mg | ORAL_TABLET | Freq: Every day | ORAL | 3 refills | Status: AC

## 2024-07-21 NOTE — Progress Notes (Signed)
" ° °  Subjective:    Patient ID: [name], female    DOB: May 02, 1932, 89 y.o.   MRN: [id number]  HPI Patient is here for a follow up visit Was in the hospital and had a follow up with [facility].  Patient would like to have a UA completed to see if her UTI has wetn away since her hospital visit Very nice patient Here today for hospital follow-up and recent office visit follow-up She is struggling with a lot of fatigue and tiredness and feeling rundown She also has underlying history of hypertension and frequent UTIs She relates a little bit of dysuria but denies any high fever chills sweats No nausea vomiting diarrhea Also has diabetes We went over every single one of her medicines We talked about what medicines do what for her in adjusted the dosings accordingly Patient also wants to discuss medications. Wants to know if all of these medications she is taking are necessary to be taking    Review of Systems     Objective:   Physical Exam  General-in no acute distress Eyes-no discharge Lungs-respiratory rate normal, CTA CV-no murmurs,RRR Extremities skin warm dry no edema Neuro grossly normal Behavior normal, alert  Urine under the microscope WBCs noted leukocytes on dipstick urine culture sent     Assessment & Plan:   1. Essential hypertension Blood pressure slightly higher than what I would like it to be ideally I would like to see the top number more like 150-155 we will bump up the dose of amlodipine  if that does cause significant swelling in the legs we will go back down on the dose and perhaps adjust hydralazine  We are not trying to get her blood pressure at 140/90 or less because of her age - amLODipine  (NORVASC ) 10 MG tablet; Take 1 tablet (10 mg total) by mouth daily.  Dispense: 90 tablet; Refill: 2  2. Dysuria (Primary) Urine culture sent Keflex  5 days - POCT urinalysis dipstick - Urine Culture  3. Controlled type 2 diabetes mellitus without complication,  without long-term current use of insulin  (HCC) A1c recent decent control continue metformin  kidney function look good  4. Incomplete bladder emptying See above - POCT urinalysis dipstick - Urine Culture  5. Basal cell carcinoma (BCC) of skin of nose In my opinion she should go forward with the surgery.  My concern is if she lives several more years this area on the nose could get large enough to where it could cause significant problems  6. Iron  deficiency anemia, unspecified iron  deficiency anemia type Her saturation is low her hemoglobin is low worse she is having a lot of fatigue tiredness I think she would benefit from evaluation and possible iron  infusions - Ambulatory referral to Hematology / Oncology  To follow-up in a few weeks to have blood pressure rechecked  To follow-up with myself in April "

## 2024-07-26 LAB — URINE CULTURE

## 2024-07-26 LAB — SPECIMEN STATUS REPORT

## 2024-07-27 ENCOUNTER — Other Ambulatory Visit: Payer: Self-pay | Admitting: Family Medicine

## 2024-07-27 ENCOUNTER — Telehealth: Payer: Self-pay | Admitting: Family Medicine

## 2024-07-27 MED ORDER — NITROFURANTOIN MONOHYD MACRO 100 MG PO CAPS: ORAL_CAPSULE | ORAL | 0 refills | Status: AC

## 2024-07-27 NOTE — Telephone Encounter (Signed)
 Nurses 1.  Please fax the new order for Macrobid  to [facility] the prescription was already sent Rx care the reason for this change is her urine shows a drug-resistant bacteria that requires this switch If any questions let me know  Thank you-Dr. Glendia

## 2024-07-27 NOTE — Telephone Encounter (Signed)
 Fax was sent over

## 2024-08-02 ENCOUNTER — Emergency Department (HOSPITAL_COMMUNITY): Admission: EM | Admit: 2024-08-02 | Discharge: 2024-08-03 | Disposition: A | Discharge status: Skilled Nursing Facility

## 2024-08-02 ENCOUNTER — Encounter (HOSPITAL_COMMUNITY): Payer: Self-pay | Admitting: Emergency Medicine

## 2024-08-02 ENCOUNTER — Other Ambulatory Visit: Payer: Self-pay

## 2024-08-02 ENCOUNTER — Emergency Department (HOSPITAL_COMMUNITY)

## 2024-08-02 ENCOUNTER — Ambulatory Visit: Admitting: Physician Assistant

## 2024-08-02 DIAGNOSIS — E860 Dehydration: Secondary | ICD-10-CM | POA: Insufficient documentation

## 2024-08-02 DIAGNOSIS — R531 Weakness: Secondary | ICD-10-CM

## 2024-08-02 DIAGNOSIS — Z7982 Long term (current) use of aspirin: Secondary | ICD-10-CM | POA: Insufficient documentation

## 2024-08-02 DIAGNOSIS — Z7984 Long term (current) use of oral hypoglycemic drugs: Secondary | ICD-10-CM | POA: Insufficient documentation

## 2024-08-02 DIAGNOSIS — Z79899 Other long term (current) drug therapy: Secondary | ICD-10-CM | POA: Insufficient documentation

## 2024-08-02 LAB — HEPATIC FUNCTION PANEL
ALT: 10 U/L (ref 0–44)
AST: 17 U/L (ref 15–41)
Albumin: 4 g/dL (ref 3.5–5.0)
Alkaline Phosphatase: 112 U/L (ref 38–126)
Bilirubin, Direct: 0.2 mg/dL (ref 0.0–0.2)
Indirect Bilirubin: 0.2 mg/dL — ABNORMAL LOW (ref 0.3–0.9)
Total Bilirubin: 0.4 mg/dL (ref 0.0–1.2)
Total Protein: 6.8 g/dL (ref 6.5–8.1)

## 2024-08-02 LAB — CBC WITH DIFFERENTIAL/PLATELET
Abs Immature Granulocytes: 0.09 10*3/uL — ABNORMAL HIGH (ref 0.00–0.07)
Basophils Absolute: 0 10*3/uL (ref 0.0–0.1)
Basophils Relative: 0 %
Eosinophils Absolute: 0.2 10*3/uL (ref 0.0–0.5)
Eosinophils Relative: 2 %
HCT: 35.5 % — ABNORMAL LOW (ref 36.0–46.0)
Hemoglobin: 11.2 g/dL — ABNORMAL LOW (ref 12.0–15.0)
Immature Granulocytes: 1 %
Lymphocytes Relative: 22 %
Lymphs Abs: 2.2 10*3/uL (ref 0.7–4.0)
MCH: 26.4 pg (ref 26.0–34.0)
MCHC: 31.5 g/dL (ref 30.0–36.0)
MCV: 83.7 fL (ref 80.0–100.0)
Monocytes Absolute: 0.8 10*3/uL (ref 0.1–1.0)
Monocytes Relative: 8 %
Neutro Abs: 6.5 10*3/uL (ref 1.7–7.7)
Neutrophils Relative %: 67 %
Platelets: 319 10*3/uL (ref 150–400)
RBC: 4.24 MIL/uL (ref 3.87–5.11)
RDW: 15.1 % (ref 11.5–15.5)
WBC: 9.7 10*3/uL (ref 4.0–10.5)
nRBC: 0 % (ref 0.0–0.2)

## 2024-08-02 LAB — URINALYSIS, W/ REFLEX TO CULTURE (INFECTION SUSPECTED)
Bilirubin Urine: NEGATIVE
Glucose, UA: NEGATIVE mg/dL
Hgb urine dipstick: NEGATIVE
Ketones, ur: NEGATIVE mg/dL
Leukocytes,Ua: NEGATIVE
Nitrite: NEGATIVE
Protein, ur: 30 mg/dL — AB
Specific Gravity, Urine: 1.009 (ref 1.005–1.030)
pH: 7 (ref 5.0–8.0)

## 2024-08-02 LAB — TROPONIN T, HIGH SENSITIVITY
Troponin T High Sensitivity: 26 ng/L — ABNORMAL HIGH (ref 0–19)
Troponin T High Sensitivity: 25 ng/L — ABNORMAL HIGH (ref 0–19)

## 2024-08-02 LAB — BASIC METABOLIC PANEL WITH GFR
Anion gap: 14 (ref 5–15)
BUN: 16 mg/dL (ref 8–23)
CO2: 24 mmol/L (ref 22–32)
Calcium: 9.5 mg/dL (ref 8.9–10.3)
Chloride: 95 mmol/L — ABNORMAL LOW (ref 98–111)
Creatinine, Ser: 0.86 mg/dL (ref 0.44–1.00)
GFR, Estimated: >60 mL/min
Glucose, Bld: 108 mg/dL — ABNORMAL HIGH (ref 70–99)
Potassium: 4.3 mmol/L (ref 3.5–5.1)
Sodium: 133 mmol/L — ABNORMAL LOW (ref 135–145)

## 2024-08-02 LAB — RESP PANEL BY RT-PCR (RSV, FLU A&B, COVID)  RVPGX2
Influenza A by PCR: NEGATIVE
Influenza B by PCR: NEGATIVE
Resp Syncytial Virus by PCR: NEGATIVE
SARS Coronavirus 2 by RT PCR: NEGATIVE

## 2024-08-02 LAB — CBG MONITORING, ED: Glucose-Capillary: 130 mg/dL — ABNORMAL HIGH (ref 70–99)

## 2024-08-02 LAB — LACTIC ACID, PLASMA
Lactic Acid, Venous: 1.7 mmol/L (ref 0.5–1.9)
Lactic Acid, Venous: 1.8 mmol/L (ref 0.5–1.9)

## 2024-08-02 LAB — LIPASE, BLOOD: Lipase: 17 U/L (ref 11–51)

## 2024-08-02 MED ORDER — ALPRAZOLAM 0.5 MG PO TABS
0.2500 mg | ORAL_TABLET | Freq: Once | ORAL | Status: AC
  Administered 2024-08-02: 0.25 mg via ORAL
  Filled 2024-08-02: qty 1

## 2024-08-02 MED ORDER — DICLOFENAC SODIUM 1 % EX GEL
2.0000 g | Freq: Once | CUTANEOUS | Status: AC
  Administered 2024-08-02: 2 g via TOPICAL
  Filled 2024-08-02: qty 100

## 2024-08-02 MED ORDER — ACETAMINOPHEN 325 MG PO TABS
650.0000 mg | ORAL_TABLET | ORAL | Status: DC | PRN
  Administered 2024-08-02: 650 mg via ORAL
  Filled 2024-08-02: qty 2

## 2024-08-02 MED ORDER — IOHEXOL 350 MG/ML SOLN
100.0000 mL | Freq: Once | INTRAVENOUS | Status: AC | PRN
  Administered 2024-08-02: 75 mL via INTRAVENOUS

## 2024-08-02 MED ORDER — LACTATED RINGERS IV BOLUS
500.0000 mL | Freq: Once | INTRAVENOUS | Status: AC
  Administered 2024-08-02: 500 mL via INTRAVENOUS

## 2024-08-02 NOTE — Discharge Instructions (Addendum)
 Drink Lots of fluids and eat a balanced diet.  Call and follow-up with your primary care doctor.  Return to the ER for any new or worsening symptoms.

## 2024-08-02 NOTE — ED Triage Notes (Signed)
 Pt bib [facility] from [facility] for weakness x a few weeks. Pt has recently been treated w/ 2 antibiotics for a UTI. Pt has had poor PO intake . Baseline is walking and talking but hasn't been up doing  that lately.

## 2024-08-03 ENCOUNTER — Ambulatory Visit: Admitting: Nurse Practitioner

## 2024-08-03 NOTE — ED Notes (Signed)
 [facility] aware to come and pick pt up. They will be here as soon as first shift gets settled.

## 2024-08-04 ENCOUNTER — Encounter (INDEPENDENT_AMBULATORY_CARE_PROVIDER_SITE_OTHER)

## 2024-08-05 ENCOUNTER — Inpatient Hospital Stay (HOSPITAL_COMMUNITY)
Admission: EM | Admit: 2024-08-05 | Source: Skilled Nursing Facility | Attending: Internal Medicine | Admitting: Internal Medicine

## 2024-08-05 DIAGNOSIS — N390 Urinary tract infection, site not specified: Secondary | ICD-10-CM | POA: Diagnosis not present

## 2024-08-05 DIAGNOSIS — K219 Gastro-esophageal reflux disease without esophagitis: Secondary | ICD-10-CM | POA: Diagnosis not present

## 2024-08-05 DIAGNOSIS — N3000 Acute cystitis without hematuria: Principal | ICD-10-CM

## 2024-08-05 DIAGNOSIS — E871 Hypo-osmolality and hyponatremia: Secondary | ICD-10-CM | POA: Diagnosis present

## 2024-08-05 DIAGNOSIS — I10 Essential (primary) hypertension: Secondary | ICD-10-CM

## 2024-08-05 DIAGNOSIS — E1165 Type 2 diabetes mellitus with hyperglycemia: Secondary | ICD-10-CM

## 2024-08-05 DIAGNOSIS — G4700 Insomnia, unspecified: Secondary | ICD-10-CM | POA: Diagnosis not present

## 2024-08-05 DIAGNOSIS — E1142 Type 2 diabetes mellitus with diabetic polyneuropathy: Secondary | ICD-10-CM | POA: Diagnosis not present

## 2024-08-05 LAB — BASIC METABOLIC PANEL WITH GFR
Anion gap: 17 — ABNORMAL HIGH (ref 5–15)
BUN: 15 mg/dL (ref 8–23)
CO2: 21 mmol/L — ABNORMAL LOW (ref 22–32)
Calcium: 9.3 mg/dL (ref 8.9–10.3)
Chloride: 90 mmol/L — ABNORMAL LOW (ref 98–111)
Creatinine, Ser: 0.87 mg/dL (ref 0.44–1.00)
GFR, Estimated: >60 mL/min
Glucose, Bld: 183 mg/dL — ABNORMAL HIGH (ref 70–99)
Potassium: 4.1 mmol/L (ref 3.5–5.1)
Sodium: 127 mmol/L — ABNORMAL LOW (ref 135–145)

## 2024-08-05 LAB — URINALYSIS, ROUTINE W REFLEX MICROSCOPIC
Bilirubin Urine: NEGATIVE
Glucose, UA: NEGATIVE mg/dL
Hgb urine dipstick: NEGATIVE
Ketones, ur: NEGATIVE mg/dL
Nitrite: NEGATIVE
Protein, ur: 30 mg/dL — AB
Specific Gravity, Urine: 1.004 — ABNORMAL LOW (ref 1.005–1.030)
WBC, UA: >50 WBC/hpf (ref 0–5)
pH: 6 (ref 5.0–8.0)

## 2024-08-05 LAB — CBC
HCT: 33 % — ABNORMAL LOW (ref 36.0–46.0)
Hemoglobin: 10.6 g/dL — ABNORMAL LOW (ref 12.0–15.0)
MCH: 26.2 pg (ref 26.0–34.0)
MCHC: 32.1 g/dL (ref 30.0–36.0)
MCV: 81.7 fL (ref 80.0–100.0)
Platelets: 326 10*3/uL (ref 150–400)
RBC: 4.04 MIL/uL (ref 3.87–5.11)
RDW: 15 % (ref 11.5–15.5)
WBC: 8.3 10*3/uL (ref 4.0–10.5)
nRBC: 0 % (ref 0.0–0.2)

## 2024-08-05 MED ORDER — ONDANSETRON HCL 4 MG PO TABS: 4.0000 mg | ORAL_TABLET | Freq: Four times a day (QID) | ORAL | Status: AC | PRN

## 2024-08-05 MED ORDER — ONDANSETRON HCL 4 MG/2ML IJ SOLN: 4.0000 mg | Freq: Four times a day (QID) | INTRAMUSCULAR | Status: AC | PRN

## 2024-08-05 MED ORDER — ACETAMINOPHEN 325 MG PO TABS
650.0000 mg | ORAL_TABLET | Freq: Four times a day (QID) | ORAL | Status: AC | PRN
  Administered 2024-08-06: 650 mg via ORAL
  Filled 2024-08-05: qty 2

## 2024-08-05 MED ORDER — ACETAMINOPHEN 650 MG RE SUPP: 650.0000 mg | Freq: Four times a day (QID) | RECTAL | Status: AC | PRN

## 2024-08-05 MED ORDER — MELATONIN 3 MG PO TABS
6.0000 mg | ORAL_TABLET | Freq: Once | ORAL | Status: AC
  Administered 2024-08-05: 6 mg via ORAL
  Filled 2024-08-05: qty 2

## 2024-08-05 MED ORDER — INSULIN ASPART 100 UNIT/ML IJ SOLN
0.0000 [IU] | Freq: Three times a day (TID) | INTRAMUSCULAR | Status: AC
  Administered 2024-08-06 (×2): 1 [IU] via SUBCUTANEOUS
  Filled 2024-08-05 (×2): qty 1

## 2024-08-05 MED ORDER — SODIUM CHLORIDE 0.9 % IV SOLN: INTRAVENOUS | Status: AC

## 2024-08-05 MED ORDER — ENOXAPARIN SODIUM 40 MG/0.4ML IJ SOSY
40.0000 mg | PREFILLED_SYRINGE | INTRAMUSCULAR | Status: AC
  Administered 2024-08-06: 40 mg via SUBCUTANEOUS
  Filled 2024-08-05: qty 0.4

## 2024-08-05 MED ORDER — SODIUM CHLORIDE 0.9 % IV SOLN
1.0000 g | Freq: Two times a day (BID) | INTRAVENOUS | Status: AC
  Administered 2024-08-05 – 2024-08-06 (×3): 1 g via INTRAVENOUS
  Filled 2024-08-05 (×3): qty 20

## 2024-08-05 MED ORDER — ACETAMINOPHEN 325 MG PO TABS
650.0000 mg | ORAL_TABLET | Freq: Once | ORAL | Status: AC
  Administered 2024-08-05: 650 mg via ORAL
  Filled 2024-08-05: qty 2

## 2024-08-05 MED ORDER — ONDANSETRON HCL 4 MG/2ML IJ SOLN
4.0000 mg | Freq: Once | INTRAMUSCULAR | Status: AC
  Administered 2024-08-05: 4 mg via INTRAVENOUS
  Filled 2024-08-05: qty 2

## 2024-08-05 MED ORDER — SODIUM CHLORIDE 0.9 % IV BOLUS
1000.0000 mL | Freq: Once | INTRAVENOUS | Status: AC
  Administered 2024-08-05: 1000 mL via INTRAVENOUS

## 2024-08-05 NOTE — ED Notes (Signed)
 patient unable to urinate

## 2024-08-05 NOTE — H&P (Addendum)
 " History and Physical    Patient: [name] FMW:[id number] DOB: [birth year] DOA: 08/05/2024 DOS: the patient was seen and examined on 08/05/2024 PCP: [name], MD  Patient coming from: ALF/ILF  Chief Complaint:  Chief Complaint  Patient presents with   Urinary Tract Infection   Nausea   HPI: [name] is a 89 y.o. 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  Patient states that she was having UTI symptoms about 2 weeks ago and recently completed antibiotics for this on Sunday (2/1). She was seen in the ED on Monday (2/2) due to generalized weakness and dehydration.  IV hydration was provided and patient was discharged home.  She complained of progressive weakness and lack of energy since returning home from ED visit  ED course In the emergency department, BP was elevated at 176/67, other vital signs were within normal range.  Workup in the ED showed normocytic anemia.  BMP was significant for sodium of 127, chloride 90, bicarb 21, blood glucose 183.  Urinalysis was suggestive of UTI. Urine culture done on 07/21/2024 showed multidrug-resistant E. coli which was sensitive to meropenem .  Patient was treated with meropenem .  Tylenol  was given, IV hydration was provided and Zofran  was given.    Review of Systems: As mentioned in the history of present illness. All other systems reviewed and are negative. Past Medical History:  Diagnosis Date   Blood transfusion without reported diagnosis    Cognitive dysfunction 03/04/2019   Patient scores 21 out of 30 on [place] cognitive assessment September 2020   Diabetes mellitus without complication (HCC)    Diabetic peripheral neuropathy  associated with type 2 diabetes mellitus (HCC) 02/25/2022   Diverticulitis    Frailty 03/04/2019   H/O bilateral breast reduction surgery    Hyperlipidemia    a. intolerant to statins.    Hypertension    Past Surgical History:  Procedure Laterality Date   ABDOMINAL HYSTERECTOMY  2003   APPENDECTOMY     age 37   BIOPSY  04/10/2023   Procedure: BIOPSY;  Surgeon: Cinderella Deatrice FALCON, MD;  Location: [facility];  Service: Endoscopy;;   BREAST REDUCTION SURGERY     age 35   COLON SURGERY Left 2011   Hemicolectomy due to diverticulitis   ESOPHAGOGASTRODUODENOSCOPY (EGD) WITH PROPOFOL  N/A 04/10/2023   Procedure: ESOPHAGOGASTRODUODENOSCOPY (EGD) WITH PROPOFOL ;  Surgeon: Cinderella Deatrice FALCON, MD;  Location: [facility];  Service: Endoscopy;  Laterality: N/A;   EYE SURGERY  03/30/2009   cataract   IR ANGIOGRAM SELECTIVE EACH ADDITIONAL VESSEL  04/15/2024   IR ANGIOGRAM VISCERAL SELECTIVE  04/11/2023   IR INTRAVASCULAR ULTRASOUND NON CORONARY  04/11/2023   IR RADIOLOGIST EVAL & MGMT  04/12/2024   IR TRANSCATH PLC STENT 1ST ART NOT LE CV CAR VERT CAR  04/11/2023   IR TRANSCATH PLC STENT 1ST ART NOT LE CV CAR VERT CAR  04/15/2024   IR US  GUIDE BX ASP/DRAIN  04/15/2024   IR US  GUIDE BX ASP/DRAIN  04/17/2024   IR US  GUIDE BX ASP/DRAIN  04/17/2024   IR US  GUIDE VASC ACCESS LEFT  04/15/2024   IR US  GUIDE VASC ACCESS RIGHT  04/11/2023   KNEE SURGERY Right 2003   knee cap  LAPAROSCOPIC INCISIONAL / UMBILICAL / VENTRAL HERNIA REPAIR  02/23/2007   REDUCTION MAMMAPLASTY Bilateral 2001   REFRACTIVE SURGERY     TOTAL KNEE ARTHROPLASTY Right 04/10/2022   Procedure: TOTAL KNEE ARTHROPLASTY;  Surgeon: Gerome Charleston, MD;  Location: [facility];  Service: Orthopedics;  Laterality: Right;  adductor canal   Social History:  reports that she has never smoked. She has never used smokeless tobacco. She reports that she does not drink alcohol  and does not use drugs.  Allergies[1]  Family History  Problem  Relation Age of Onset   Cancer Mother        ovaian   Hypertension Father        kidney   Stroke Father    Cancer Father    Hypertension Sister    Diabetes Brother     Prior to Admission medications  Medication Sig Start Date End Date Taking? Authorizing Provider  acetaminophen  (TYLENOL ) 325 MG tablet TAKE (2) TABLETS BY MOUTH EVERY SIX HOURS AS NEEDED FOR PAIN. MAX 3GM IN 24 HOURS. Patient taking differently: Take 650 mg by mouth every 6 (six) hours as needed for mild pain (pain score 1-3) or moderate pain (pain score 4-6). 07/28/23   [name], MD  ALPRAZolam  (XANAX ) 0.25 MG tablet Take 1 tablet (0.25 mg total) by mouth at bedtime. 07/21/24   [name], MD  amLODipine  (NORVASC ) 10 MG tablet Take 1 tablet (10 mg total) by mouth daily. 07/21/24   [name], MD  aspirin  (ASPIRIN  LOW DOSE) 81 MG chewable tablet CHEW (1) TABLET BY MOUTH ONCE DAILY. Patient taking differently: Chew 81 mg by mouth every morning. 09/06/23   [name], MD  BETA CAROTENE PROVITAMIN A 25000 units capsule TAKE (1) CAPSULE BY MOUTH AT BEDTIME. Patient taking differently: Take 25,000 Units by mouth at bedtime. 02/24/24   [name], MD  cyanocobalamin  1000 MCG tablet Take 1,000 mcg by mouth at bedtime.    [provider]  cycloSPORINE  (RESTASIS ) 0.05 % ophthalmic emulsion Place 1 drop into both eyes 2 (two) times daily. 05/05/24   Landy Barnie RAMAN, NP  estradiol  (ESTRACE ) 0.01 % CREA vaginal cream Place 1 g vaginally every other day. **Apply one blueberry sized amount vaginally every other night for long term 05/20/24   [provider]  feeding supplement (ENSURE PLUS HIGH PROTEIN) LIQD Take 237 mLs by mouth 2 (two) times daily between meals. 04/22/24   Rashid, Farhan, MD  gabapentin  (NEURONTIN ) 100 MG capsule Take 1 capsule (100 mg total) by mouth daily. 07/21/24   [name], MD  hydrALAZINE  (APRESOLINE ) 25 MG tablet Take 1 tablet (25 mg total) by mouth 3 (three) times  daily. 05/14/24   Luking, Glendia LABOR, MD  IVIZIA DRY EYES 0.5 % SOLN Place 1 drop into both eyes 2 (two) times daily. 09/22/23   [provider]  Lactobacillus PACK packet TAKE (1) PACKET BY MOUTH AS DIRECTED THREE TIMES DAILY. 07/19/24   [name], MD  Melatonin 10 MG TABS TAKE (1) TABLET BY MOUTH AT BEDTIME 07/16/24   Grooms, [place], PA-C  Menthol , Topical Analgesic, (BIOFREEZE COOL THE PAIN) 4 % GEL Apply 1 Application topically 3 (three) times daily as needed (for pain to neck/back). 1 application; topical Special Instructions: apply to back of neck three times daily due to pain. Three Times A Day 09:00 AM, 02:00 PM, 09:00 PM    [provider]  metFORMIN  (GLUCOPHAGE ) 500 MG tablet TAKE (1) TABLET BY MOUTH  IN THE MORNING & (1/2) TABLET (250MG ) BY MOUTH AT SUPPER Patient taking differently: Take 250-500 mg by mouth See admin instructions. TAKE (1) TABLET BY MOUTH IN THE MORNING & (1/2) TABLET (250MG ) BY MOUTH AT SUPPER 05/14/24   Luking, Glendia LABOR, MD  methenamine  (HIPREX ) 1 g tablet Take 1 tablet (1 g total) by mouth 2 (two) times daily with a meal. Most effective when taken with a daily Vitamin C supplement. 05/05/24   Landy Barnie RAMAN, NP  Multiple Vitamins-Minerals (PRESERVISION AREDS 2) CAPS Take 1 capsule by mouth in the morning and at bedtime.    [provider]  nitrofurantoin , macrocrystal-monohydrate, (MACROBID ) 100 MG capsule 1 taken twice daily for 5 days 07/27/24   [name], MD  ondansetron  (ZOFRAN ) 4 MG tablet Take 1 tablet (4 mg total) by mouth every 6 (six) hours as needed for nausea or vomiting. 05/05/24   Landy Barnie RAMAN, NP  pantoprazole  (PROTONIX ) 40 MG tablet Take 1 tablet (40 mg total) by mouth 2 (two) times daily. 07/21/24   [name], MD  polyethylene glycol (MIRALAX  / GLYCOLAX ) 17 g packet Take 17 g by mouth daily as needed for moderate constipation. 04/22/24   Rashid, Farhan, MD  psyllium (METAMUCIL SMOOTH TEXTURE) 58.6 % powder One bid  with water  stop lactulose Patient taking differently: Take 1 packet by mouth in the morning and at bedtime. *stop lactulose 05/14/24   Luking, Glendia LABOR, MD  sodium chloride  1 g tablet One qd Patient taking differently: Take 1 g by mouth every morning. One qd 05/14/24   [name], MD  sucralfate  (CARAFATE ) 1 g tablet Take 1 tablet (1 g total) by mouth 3 (three) times daily before meals. 07/13/24   Cook, Jayce G, DO  valsartan  (DIOVAN ) 40 MG tablet Take 1 tablet (40 mg total) by mouth daily. 07/21/24   [name], MD  vitamin D3 (CHOLECALCIFEROL ) 25 MCG tablet Take 1 tablet (1,000 Units total) by mouth daily. 05/20/24   [name], MD    Physical Exam: Vitals:   08/05/24 1810  BP: (!) 176/67  Pulse: 91  Resp: 18  Temp: 98.3 F (36.8 C)  TempSrc: Oral  SpO2: 97%   General: Elderly female. Awake and alert and oriented x3. Not in any acute distress.  HEENT: NCAT.  PERRLA. EOMI. Sclerae anicteric.  Dry mucosal membranes. Neck: Neck supple without lymphadenopathy. No carotid bruits. No masses palpated.  Cardiovascular: Regular rate with normal S1-S2 sounds. No murmurs, rubs or gallops auscultated. No JVD.  Respiratory: Clear breath sounds.  No accessory muscle use. Abdomen: Soft, tender to palpation of suprapubic area, nondistended. Active bowel sounds.  Skin: No rashes, lesions, or ulcerations.  Dry, warm to touch. Musculoskeletal:  2+ dorsalis pedis and radial pulses. Good ROM.  No contractures  Psychiatric: Intact judgment and insight.  Mood appropriate to current condition. Neurologic: No focal neurological deficits. Strength is 5/5 x 4.  CN II - XII grossly intact.  Assessment and Plan: UTI POA Urine culture done on 07/21/2024 showed multidrug-resistant E. coli which was sensitive to meropenem . She was started on IV meropenem , we will continue with same at this time Urine culture pending  Hyponatremia Na 127, corrected sodium level based on CBG of 183 = 128 This is  possibly due to dehydration Continue gentle hydration Continue to monitor sodium with serial BMPs Urine osmolality, serum osmolality and urine sodium will be checked  Type 2 diabetes mellitus with hyperglycemia A1c on 06/07/2024 was 6.7  Continue ISS and hypoglycemia protocol  Diabetic peripheral neuropathy Continue gabapentin   GERD Continue Protonix   Essential hypertension Continue amlodipine , hydralazine , Avapro   Generalized anxiety Insomnia Continuing Xanax  per home regimen   Advance Care Planning: DNR  Consults: None  Family Communication: None at bedside  Severity of Illness: The appropriate patient status for this patient is INPATIENT. Inpatient status is judged to be reasonable and necessary in order to provide the required intensity of service to ensure the patient's safety. The patient's presenting symptoms, physical exam findings, and initial radiographic and laboratory data in the context of their chronic comorbidities is felt to place them at high risk for further clinical deterioration. Furthermore, it is not anticipated that the patient will be medically stable for discharge from the hospital within 2 midnights of admission.   * I certify that at the point of admission it is my clinical judgment that the patient will require inpatient hospital care spanning beyond 2 midnights from the point of admission due to high intensity of service, high risk for further deterioration and high frequency of surveillance required.*  Author: Lilja Soland, DO 08/05/2024 9:33 PM  For on call review [email or web address].      [1]  Allergies Allergen Reactions   Statins Other (See Comments)    Severe myalgias   Phenergan [Promethazine Hcl] Other (See Comments)    Hallucinations    Hydrocortisone Itching   Lisinopril Other (See Comments)    Lethargy, Fatigue   Prednisone  Rash   Trazodone  And Nefazodone Cough   "

## 2024-08-05 NOTE — ED Triage Notes (Signed)
 P comes in for urinary retention. Pt was here on Monday, dx w/ UTI. Pt states the facility did not give any abx while she has been there.  Pt has had decreases output since Tuesday. Pt states she can only urinate 1-2 tablespoons at a time. Pt is also nauseous. A&Ox4.

## 2024-08-05 NOTE — ED Provider Notes (Signed)
 " [place] [facility] AT [facility] Provider Note   CSN: [id number] Arrival date & time: 08/05/24  1759     Patient presents with: Urinary Tract Infection and Nausea   [name] is a 89 y.o. female.  Presents the ER today for decreased urinary output x 2 days.  She is seen in the ED 3 days ago.  At that time they were concerned for possible blood loss but this was normal, she was given fluids and was feeling better she had normal urinalysis, she had finished antibiotics recently for UTI.  She was discharged back to the nursing home but is apparently only able to urinate 1 or 2 tablespoons at a time is having nausea and vomiting.    Urinary Tract Infection      Prior to Admission medications  Medication Sig Start Date End Date Taking? Authorizing Provider  acetaminophen  (TYLENOL ) 325 MG tablet TAKE (2) TABLETS BY MOUTH EVERY SIX HOURS AS NEEDED FOR PAIN. MAX 3GM IN 24 HOURS. Patient taking differently: Take 650 mg by mouth every 6 (six) hours as needed for mild pain (pain score 1-3) or moderate pain (pain score 4-6). 07/28/23   [name], MD  ALPRAZolam  (XANAX ) 0.25 MG tablet Take 1 tablet (0.25 mg total) by mouth at bedtime. 07/21/24   [name], MD  amLODipine  (NORVASC ) 10 MG tablet Take 1 tablet (10 mg total) by mouth daily. 07/21/24   [name], MD  aspirin  (ASPIRIN  LOW DOSE) 81 MG chewable tablet CHEW (1) TABLET BY MOUTH ONCE DAILY. Patient taking differently: Chew 81 mg by mouth every morning. 09/06/23   [name], MD  BETA CAROTENE PROVITAMIN A 25000 units capsule TAKE (1) CAPSULE BY MOUTH AT BEDTIME. Patient taking differently: Take 25,000 Units by mouth at bedtime. 02/24/24   [name], MD  cyanocobalamin  1000 MCG tablet Take 1,000 mcg by mouth at bedtime.    [provider]  cycloSPORINE  (RESTASIS ) 0.05 % ophthalmic emulsion Place 1 drop into both eyes 2 (two) times daily. 05/05/24   Landy Barnie RAMAN, NP  estradiol   (ESTRACE ) 0.01 % CREA vaginal cream Place 1 g vaginally every other day. **Apply one blueberry sized amount vaginally every other night for long term 05/20/24   [provider]  feeding supplement (ENSURE PLUS HIGH PROTEIN) LIQD Take 237 mLs by mouth 2 (two) times daily between meals. 04/22/24   Rashid, Farhan, MD  gabapentin  (NEURONTIN ) 100 MG capsule Take 1 capsule (100 mg total) by mouth daily. 07/21/24   [name], MD  hydrALAZINE  (APRESOLINE ) 25 MG tablet Take 1 tablet (25 mg total) by mouth 3 (three) times daily. 05/14/24   Luking, Glendia LABOR, MD  IVIZIA DRY EYES 0.5 % SOLN Place 1 drop into both eyes 2 (two) times daily. 09/22/23   [provider]  Lactobacillus PACK packet TAKE (1) PACKET BY MOUTH AS DIRECTED THREE TIMES DAILY. 07/19/24   [name], MD  Melatonin 10 MG TABS TAKE (1) TABLET BY MOUTH AT BEDTIME 07/16/24   Grooms, [place], PA-C  Menthol , Topical Analgesic, (BIOFREEZE COOL THE PAIN) 4 % GEL Apply 1 Application topically 3 (three) times daily as needed (for pain to neck/back). 1 application; topical Special Instructions: apply to back of neck three times daily due to pain. Three Times A Day 09:00 AM, 02:00 PM, 09:00 PM    [provider]  metFORMIN  (GLUCOPHAGE ) 500 MG tablet TAKE (1) TABLET BY MOUTH IN THE MORNING & (  1/2) TABLET (250MG ) BY MOUTH AT SUPPER Patient taking differently: Take 250-500 mg by mouth See admin instructions. TAKE (1) TABLET BY MOUTH IN THE MORNING & (1/2) TABLET (250MG ) BY MOUTH AT SUPPER 05/14/24   Luking, Glendia LABOR, MD  methenamine  (HIPREX ) 1 g tablet Take 1 tablet (1 g total) by mouth 2 (two) times daily with a meal. Most effective when taken with a daily Vitamin C supplement. 05/05/24   Landy Barnie RAMAN, NP  Multiple Vitamins-Minerals (PRESERVISION AREDS 2) CAPS Take 1 capsule by mouth in the morning and at bedtime.    [provider]  nitrofurantoin , macrocrystal-monohydrate, (MACROBID ) 100 MG capsule 1 taken twice  daily for 5 days 07/27/24   [name], MD  ondansetron  (ZOFRAN ) 4 MG tablet Take 1 tablet (4 mg total) by mouth every 6 (six) hours as needed for nausea or vomiting. 05/05/24   Landy Barnie RAMAN, NP  pantoprazole  (PROTONIX ) 40 MG tablet Take 1 tablet (40 mg total) by mouth 2 (two) times daily. 07/21/24   [name], MD  polyethylene glycol (MIRALAX  / GLYCOLAX ) 17 g packet Take 17 g by mouth daily as needed for moderate constipation. 04/22/24   Rashid, Farhan, MD  psyllium (METAMUCIL SMOOTH TEXTURE) 58.6 % powder One bid with water  stop lactulose Patient taking differently: Take 1 packet by mouth in the morning and at bedtime. *stop lactulose 05/14/24   Luking, Glendia LABOR, MD  sodium chloride  1 g tablet One qd Patient taking differently: Take 1 g by mouth every morning. One qd 05/14/24   [name], MD  sucralfate  (CARAFATE ) 1 g tablet Take 1 tablet (1 g total) by mouth 3 (three) times daily before meals. 07/13/24   Cook, Jayce G, DO  valsartan  (DIOVAN ) 40 MG tablet Take 1 tablet (40 mg total) by mouth daily. 07/21/24   [name], MD  vitamin D3 (CHOLECALCIFEROL ) 25 MCG tablet Take 1 tablet (1,000 Units total) by mouth daily. 05/20/24   [name], MD    Allergies: Statins, Phenergan [promethazine hcl], Hydrocortisone, Lisinopril, Prednisone , and Trazodone  and nefazodone    Review of Systems  Updated Vital Signs BP (!) 176/67 (BP Location: Left Arm)   Pulse 91   Temp 98.3 F (36.8 C) (Oral)   Resp 18   SpO2 97%   Physical Exam Vitals and nursing note reviewed.  Constitutional:      General: She is not in acute distress.    Appearance: She is well-developed.  HENT:     Head: Normocephalic and atraumatic.     Mouth/Throat:     Mouth: Mucous membranes are moist.  Eyes:     Extraocular Movements: Extraocular movements intact.     Conjunctiva/sclera: Conjunctivae normal.     Pupils: Pupils are equal, round, and reactive to light.  Cardiovascular:     Rate and  Rhythm: Normal rate and regular rhythm.     Heart sounds: No murmur heard. Pulmonary:     Effort: Pulmonary effort is normal. No respiratory distress.     Breath sounds: Normal breath sounds.  Abdominal:     Palpations: Abdomen is soft.     Tenderness: There is abdominal tenderness in the suprapubic area. There is no guarding or rebound.  Musculoskeletal:        General: No swelling.     Cervical back: Neck supple.  Skin:    General: Skin is warm and dry.     Capillary Refill: Capillary refill takes less than 2 seconds.  Neurological:     General: No focal deficit present.     Mental Status: She is alert and oriented to person, place, and time.     Cranial Nerves: No cranial nerve deficit.     Sensory: No sensory deficit.     Comments: Mild generalized weakness  Psychiatric:        Mood and Affect: Mood normal.     (all labs ordered are listed, but only abnormal results are displayed) Labs Reviewed  URINALYSIS, ROUTINE W REFLEX MICROSCOPIC  CBC  BASIC METABOLIC PANEL WITH GFR    EKG: None  Radiology: No results found.   Procedures   Medications Ordered in the ED  ondansetron  (ZOFRAN ) injection 4 mg (has no administration in time range)                                    Medical Decision Making This patient presents to the ED for concern of dysuria, frequency and urgency and generalized weakness, this involves an extensive number of treatment options, and is a complaint that carries with it a high risk of complications and morbidity.  The differential diagnosis includes UTI, dehydration, AKI, electrolyte derangement, kidney stone, other   Co morbidities that complicate the patient evaluation :   Diabetes, hypertension   Additional history obtained:  Additional history obtained from EMR External records from outside source obtained and reviewed including [facility] notes, labs, urine culture from 07/21/2024   Lab Tests:  I Ordered, and personally interpreted labs.   The pertinent results include: Urinalysis significant for UTI, BMP with sodium of 127, anion gap 17 with CO2 of 21, CBC with hemoglobin is 10.6 which is stable for patient.     Consultations Obtained:  I requested consultation with the hospitalist Dr. Pearlean,  and discussed lab and imaging findings as well as pertinent plan - they recommend: Admission for treatment of UTI with IV antibiotics based on previous culture results and dehydration   Problem List / ED Course / Critical interventions / Medication management  UTI-patient having significant UTI symptoms, had recently completed outpatient treatment and had a normal urinalysis 3 days ago.  Symptoms have worsened and she is having nausea and vomiting today, had decreased p.o. intake over the past couple of days.  Abdomen is soft with mild suprapubic tenderness she is able to ambulate but has UTI that will require IV antibiotics due to multidrug resistance, do not feel Macrobid  would be appropriate due to the nausea and vomiting.  She also needs IV fluids for her dehydration which is likely the cause of her hyponatremia. I ordered medication including Zofran  for nausea, IV fluids for dehydration Reevaluation of the patient after these medicines showed that the patient improved I have reviewed the patients home medicines and have made adjustments as needed   Social Determinants of Health:  Lives in assisted living       Amount and/or Complexity of Data Reviewed Labs: ordered.  Risk OTC drugs. Prescription drug management. Decision regarding hospitalization.        Final diagnoses:  None    ED Discharge Orders     None          Suellen Sherran DELENA DEVONNA 08/05/24 2227    Patsey Lot, MD 08/05/24 2255  "

## 2024-08-06 LAB — COMPREHENSIVE METABOLIC PANEL WITH GFR
ALT: 10 U/L (ref 0–44)
AST: 17 U/L (ref 15–41)
Albumin: 3.7 g/dL (ref 3.5–5.0)
Alkaline Phosphatase: 82 U/L (ref 38–126)
Anion gap: 12 (ref 5–15)
BUN: 11 mg/dL (ref 8–23)
CO2: 26 mmol/L (ref 22–32)
Calcium: 8.9 mg/dL (ref 8.9–10.3)
Chloride: 102 mmol/L (ref 98–111)
Creatinine, Ser: 0.71 mg/dL (ref 0.44–1.00)
GFR, Estimated: >60 mL/min
Glucose, Bld: 118 mg/dL — ABNORMAL HIGH (ref 70–99)
Potassium: 3.8 mmol/L (ref 3.5–5.1)
Sodium: 140 mmol/L (ref 135–145)
Total Bilirubin: 0.5 mg/dL (ref 0.0–1.2)
Total Protein: 5.9 g/dL — ABNORMAL LOW (ref 6.5–8.1)

## 2024-08-06 LAB — CBG MONITORING, ED
Glucose-Capillary: 146 mg/dL — ABNORMAL HIGH (ref 70–99)
Glucose-Capillary: 127 mg/dL — ABNORMAL HIGH (ref 70–99)

## 2024-08-06 LAB — CBC
HCT: 32.7 % — ABNORMAL LOW (ref 36.0–46.0)
Hemoglobin: 10.5 g/dL — ABNORMAL LOW (ref 12.0–15.0)
MCH: 26.2 pg (ref 26.0–34.0)
MCHC: 32.1 g/dL (ref 30.0–36.0)
MCV: 81.5 fL (ref 80.0–100.0)
Platelets: 258 10*3/uL (ref 150–400)
RBC: 4.01 MIL/uL (ref 3.87–5.11)
RDW: 15.1 % (ref 11.5–15.5)
WBC: 6.1 10*3/uL (ref 4.0–10.5)
nRBC: 0 % (ref 0.0–0.2)

## 2024-08-06 LAB — OSMOLALITY, URINE: Osmolality, Ur: 194 mosm/kg — ABNORMAL LOW (ref 300–900)

## 2024-08-06 LAB — BASIC METABOLIC PANEL WITH GFR
Anion gap: 11 (ref 5–15)
BUN: 13 mg/dL (ref 8–23)
CO2: 25 mmol/L (ref 22–32)
Calcium: 8.2 mg/dL — ABNORMAL LOW (ref 8.9–10.3)
Chloride: 100 mmol/L (ref 98–111)
Creatinine, Ser: 0.69 mg/dL (ref 0.44–1.00)
GFR, Estimated: >60 mL/min
Glucose, Bld: 107 mg/dL — ABNORMAL HIGH (ref 70–99)
Potassium: 3.5 mmol/L (ref 3.5–5.1)
Sodium: 136 mmol/L (ref 135–145)

## 2024-08-06 LAB — MAGNESIUM: Magnesium: 1.6 mg/dL — ABNORMAL LOW (ref 1.7–2.4)

## 2024-08-06 LAB — GLUCOSE, CAPILLARY
Glucose-Capillary: 107 mg/dL — ABNORMAL HIGH (ref 70–99)
Glucose-Capillary: 138 mg/dL — ABNORMAL HIGH (ref 70–99)

## 2024-08-06 LAB — PHOSPHORUS: Phosphorus: 3.1 mg/dL (ref 2.5–4.6)

## 2024-08-06 LAB — SODIUM, URINE, RANDOM: Sodium, Ur: 38 mmol/L

## 2024-08-06 LAB — OSMOLALITY: Osmolality: 287 mosm/kg (ref 275–295)

## 2024-08-06 MED ORDER — PANTOPRAZOLE SODIUM 40 MG PO TBEC
40.0000 mg | DELAYED_RELEASE_TABLET | Freq: Two times a day (BID) | ORAL | Status: AC
  Administered 2024-08-06 (×2): 40 mg via ORAL
  Filled 2024-08-06 (×2): qty 1

## 2024-08-06 MED ORDER — GABAPENTIN 100 MG PO CAPS
100.0000 mg | ORAL_CAPSULE | Freq: Every day | ORAL | Status: AC
  Administered 2024-08-06: 100 mg via ORAL
  Filled 2024-08-06: qty 1

## 2024-08-06 MED ORDER — IRBESARTAN 75 MG PO TABS
75.0000 mg | ORAL_TABLET | Freq: Every day | ORAL | Status: AC
  Administered 2024-08-06: 75 mg via ORAL
  Filled 2024-08-06: qty 1

## 2024-08-06 MED ORDER — POLYETHYLENE GLYCOL 3350 17 G PO PACK
17.0000 g | PACK | Freq: Two times a day (BID) | ORAL | Status: AC
  Administered 2024-08-06: 17 g via ORAL
  Filled 2024-08-06 (×2): qty 1

## 2024-08-06 MED ORDER — AMLODIPINE BESYLATE 5 MG PO TABS
10.0000 mg | ORAL_TABLET | Freq: Every day | ORAL | Status: AC
  Administered 2024-08-06: 10 mg via ORAL
  Filled 2024-08-06: qty 2

## 2024-08-06 MED ORDER — ALPRAZOLAM 0.25 MG PO TABS
0.2500 mg | ORAL_TABLET | Freq: Every day | ORAL | Status: AC
  Administered 2024-08-06: 0.25 mg via ORAL
  Filled 2024-08-06: qty 1

## 2024-08-06 MED ORDER — CYCLOSPORINE 0.05 % OP EMUL
1.0000 [drp] | Freq: Two times a day (BID) | OPHTHALMIC | Status: AC
  Administered 2024-08-06 (×2): 1 [drp] via OPHTHALMIC
  Filled 2024-08-06 (×2): qty 30

## 2024-08-06 MED ORDER — HYDRALAZINE HCL 25 MG PO TABS
25.0000 mg | ORAL_TABLET | Freq: Three times a day (TID) | ORAL | Status: AC
  Administered 2024-08-06 (×3): 25 mg via ORAL
  Filled 2024-08-06 (×3): qty 1

## 2024-08-06 MED ORDER — POLYVINYL ALCOHOL 1.4 % OP SOLN
1.0000 [drp] | OPHTHALMIC | Status: AC | PRN
  Administered 2024-08-06: 1 [drp] via OPHTHALMIC
  Filled 2024-08-06: qty 15

## 2024-08-06 MED ORDER — SUCRALFATE 1 G PO TABS
1.0000 g | ORAL_TABLET | Freq: Three times a day (TID) | ORAL | Status: AC
  Administered 2024-08-06 (×3): 1 g via ORAL
  Filled 2024-08-06 (×3): qty 1

## 2024-08-06 MED ORDER — ASPIRIN 81 MG PO CHEW
81.0000 mg | CHEWABLE_TABLET | Freq: Every morning | ORAL | Status: AC
  Administered 2024-08-06: 81 mg via ORAL
  Filled 2024-08-06: qty 1

## 2024-08-06 MED ORDER — POVIDONE (PF) 0.5 % OP SOLN: 1.0000 [drp] | Freq: Two times a day (BID) | OPHTHALMIC | Status: DC

## 2024-08-06 MED ORDER — VITAMIN D 25 MCG (1000 UNIT) PO TABS
1000.0000 [IU] | ORAL_TABLET | Freq: Every day | ORAL | Status: AC
  Administered 2024-08-06: 1000 [IU] via ORAL
  Filled 2024-08-06 (×2): qty 1

## 2024-08-06 NOTE — Plan of Care (Signed)
   Problem: Education: Goal: Ability to describe self-care measures that may prevent or decrease complications (Diabetes Survival Skills Education) will improve Outcome: Progressing

## 2024-08-06 NOTE — Evaluation (Signed)
 Physical Therapy Evaluation Patient Details Name: [name] MRN: [id number] DOB: July 21, 1931 Today's Date: 08/06/2024  History of Present Illness  [name] is a 89 y.o. 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  Patient states that she was having UTI symptoms about 2 weeks ago and recently completed antibiotics for this on Sunday (2/1). She was seen in the ED on Monday (2/2) due to generalized weakness and dehydration.  IV hydration was provided and patient was discharged home.  She complained of progressive weakness and lack of energy since returning home from ED visit    Clinical Impression  Patient lying in bed on therapist arrival.  She is pleasant and agreeable to therapist assessment.  Patient performs bed mobility with Supervision/CGA; sits on the edge of the bed with good balance when feet are on the floor.  Sit to stand to RW with CGA for safety.  Patient ambulates with RW and CGA x 100 ft.  She returns to room and bed with CGA.  Patient left in bed with call button in reach and nursing notified of mobility status. Patient will benefit from continued skilled therapy services during the remainder of her hospital stay and at the next recommended venue of care to address deficits and promote return to optimal function.             If plan is discharge home, recommend the following: A little help with walking and/or transfers;A little help with bathing/dressing/bathroom;Assist for transportation   Can travel by private vehicle        Equipment Recommendations None recommended by PT  Recommendations for Other Services       Functional Status Assessment Patient has had a  recent decline in their functional status and demonstrates the ability to make significant improvements in function in a reasonable and predictable amount of time.     Precautions / Restrictions Precautions Precautions: Fall Recall of Precautions/Restrictions: Intact Restrictions Weight Bearing Restrictions Per Provider Order: No      Mobility  Bed Mobility Overal bed mobility: Needs Assistance Bed Mobility: Supine to Sit     Supine to sit: Supervision, Contact guard          Transfers Overall transfer level: Needs assistance Equipment used: Rolling walker (2 wheels) Transfers: Sit to/from Stand Sit to Stand: Contact guard assist                Ambulation/Gait Ambulation/Gait assistance: Contact guard assist Gait Distance (Feet): 100 Feet Assistive device: Rolling walker (2 wheels) Gait Pattern/deviations: Decreased step length - right, Decreased step length - left Gait velocity: decreased        Stairs            Wheelchair Mobility     Tilt Bed    Modified Rankin (Stroke Patients Only)       Balance Overall balance assessment: Needs assistance Sitting-balance support: No upper extremity supported, Feet supported Sitting balance-Leahy Scale: Good Sitting balance - Comments: good sitting balance on the edge of the bed and on BSC   Standing balance support: Bilateral upper extremity supported, During functional activity, Reliant on assistive device for balance (RW) Standing balance-Leahy Scale: Good Standing balance comment:  (good standing balance with RW and CGA)  Pertinent Vitals/Pain Pain Assessment Pain Assessment: 0-10 Pain Score: 7  Pain Location: abdominal pain Pain Descriptors / Indicators: Cramping, Constant, Burning Pain Intervention(s): Monitored during session    Home Living Family/patient expects to be discharged to:: Assisted living                 Home Equipment: Clinical Biochemist (2 wheels) Additional Comments: lives at [facility]    Prior Function Prior Level of Function : Needs assist       Physical Assist : Mobility (physical);ADLs (physical) Mobility (physical): Gait (walks with RW) ADLs (physical): Bathing;Dressing Mobility Comments: Ambulates using RW. Denies fall history. ADLs Comments: Physically independent for most ADL's; pt reports supervision assist by staff for safety and assist to wash her back. IADL assist.     Extremity/Trunk Assessment   Upper Extremity Assessment Upper Extremity Assessment: Overall WFL for tasks assessed    Lower Extremity Assessment Lower Extremity Assessment: Generalized weakness    Cervical / Trunk Assessment Cervical / Trunk Assessment: Normal  Communication   Communication Communication: No apparent difficulties    Cognition Arousal: Alert Behavior During Therapy:  (wfl)   PT - Cognitive impairments: No apparent impairments                       PT - Cognition Comments: pleasant and cooperative Following commands: Intact       Cueing Cueing Techniques: Verbal cues     General Comments      Exercises     Assessment/Plan    PT Assessment Patient needs continued PT services  PT Problem List Decreased strength;Decreased activity tolerance;Decreased balance;Pain       PT Treatment Interventions Gait training;Functional mobility training;Therapeutic activities;Therapeutic exercise;Balance training;Neuromuscular re-education;Patient/family education    PT Goals (Current goals can be found in the Care Plan section)  Acute Rehab PT Goals Patient Stated Goal: return to ALF PT Goal Formulation: With patient Time For Goal Achievement: 08/20/24 Potential to Achieve Goals: Good    Frequency Min 2X/week     Co-evaluation               AM-PAC PT 6 Clicks Mobility  Outcome Measure Help needed turning from your back to your side while in a flat bed without  using bedrails?: A Little Help needed moving from lying on your back to sitting on the side of a flat bed without using bedrails?: A Little Help needed moving to and from a bed to a chair (including a wheelchair)?: A Little Help needed standing up from a chair using your arms (e.g., wheelchair or bedside chair)?: A Little Help needed to walk in hospital room?: A Little Help needed climbing 3-5 steps with a railing? : A Lot 6 Click Score: 17    End of Session Equipment Utilized During Treatment: Gait belt Activity Tolerance: Patient tolerated treatment well Patient left: in bed;with call bell/phone within reach Nurse Communication: Mobility status PT Visit Diagnosis: Other abnormalities of gait and mobility (R26.89);Muscle weakness (generalized) (M62.81);Unsteadiness on feet (R26.81)    Time: 1000-1030 PT Time Calculation (min) (ACUTE ONLY): 30 min   Charges:   PT Evaluation $PT Eval Moderate Complexity: 1 Mod   PT General Charges $$ ACUTE PT VISIT: 1 Visit         10:55 AM, 08/06/24 Rikita Grabert Small Sherryl Valido MPT [facility] [place] [phone] [phone]

## 2024-08-06 NOTE — ED Notes (Signed)
 Transport aware to move pt.

## 2024-08-06 NOTE — ED Notes (Signed)
"  Pt refused gown.   "

## 2024-08-06 NOTE — ED Notes (Signed)
 Pt called nurse into the room stating when she closes her eyes she sees snow and trees and feels like she is moving. Pt stated she does not have that when her eyes are open. Pt advised to try and keep her eyes open being that she needs to sleep tonight anyway . Nurse explained UTI can cause weird symptoms. Attending aware via chat.

## 2024-08-06 NOTE — Progress Notes (Signed)
 "          PROGRESS NOTE  [name] FMW:[id number] DOB: 06-13-1932 DOA: 08/05/2024 PCP: [name], MD  Brief History:  89 y.o. 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  The patient states that she has been having some trouble urinating and getting her urine out.  Notably, the patient was recently treated by her outpatient physician for UTI with cephalexin  which she finished on 08/01/2024.  She was seen in the ED on Monday (2/2) due to generalized weakness and dehydration. IV hydration was provided and patient was discharged home. She complained of progressive weakness and lack of energy since returning home from ED visit.  She denies any chest pain, shortness breath, coughing, hemoptysis, hematemesis, hematochezia, melena.  She has suprapubic pain.  She denies any headache or neck pain.  In the ED, the patient was afebrile and hemodynamically stable.  Oxygen saturation was 94-96% on room air.  Her oxygen saturation does drop to 87% on room air when she is sleeping.  Sodium 127, potassium 4.1, bicarbonate 21, serum creatinine 0.7.  WBC 8.3, hemoglobin 10.6, platelets 326.  The patient was started on IV fluids and IV Merrem .   Assessment/Plan: Generalized weakness - Secondary to UTI - PT evaluation - 07/05/2024 B12 1137 - 07/05/2024 folic acid  14.3  UTI - Continue empiric meropenem  given frequent antibiotics pressure - Follow urine culture  Iron  deficiency anemia - Plan to give a dose of IV iron  prior to discharge - 07/05/2024 iron  saturation 10%, ferritin 15  Abdominal pain - This appears to be a recurrent/chronic issue for the patient - Multifactorial including constipation and incomplete bladder  emptying and UTI - 07/04/2024 CTA AP neg for mesenteric ischemia, patent mesenteric art and celiac stents  - 08/02/2024 CTA AP widely patent mesenteric artery and celiac stents, moderately distended bladder - Continue Metamucil twice daily  - Continue pantoprazole  twice daily  Incomplete urinary bladder emptying - This has been a chronic issue for the patient - Certainly, this may be predisposing her to some UTIs  Controlled diabetes mellitus type 2 - 06/07/2024 hemoglobin A1c 6.7 - NovoLog /scale for now  Essential hypertension - Recently increased amlodipine  to 10 mg daily - Continue hydralazine  and ARB - Aim for SBP 150s  Generalized anxiety Resumed home Xanax  -PDMP reviewed--xanax  0.25 mg, #30 filled 06/14/24           Family Communication:   no Family at bedside  Consultants:  none  Code Status:  DNR  DVT Prophylaxis:   [place] Lovenox    Procedures: As Listed in Progress Note Above  Antibiotics: Merrem  1/5>>      Subjective: Patient complains of dysuria and suprapubic pain.  She denies any chest pain, short of breath, coughing or hemoptysis.  She has some nausea without emesis.  Objective: Vitals:   08/06/24 0640 08/06/24 0732 08/06/24 0732 08/06/24 0839  BP:   (!) 179/61 (!) 165/53  Pulse:  70  69  Resp:    16  Temp: 98.3 F (36.8 C)   97.8 F (36.6 C)  TempSrc: Oral   Oral  SpO2:  97%  96%   No intake or output data in the 24 hours ending 08/06/24 0912 Weight change:  Exam:  General:  Pt is alert, follows commands appropriately,  not in acute distress HEENT: No icterus, No thrush, No neck mass, [place]/AT Cardiovascular: RRR, S1/S2, no rubs, no gallops Respiratory: CTA bilaterally, no wheezing, no crackles, no rhonchi Abdomen: Soft/+BS, suprapubic tender, non distended, no guarding Extremities: No edema, No lymphangitis, No petechiae, No rashes, no synovitis   Data Reviewed: I have personally reviewed following labs and imaging studies Basic  Metabolic Panel: Recent Labs  Lab 08/02/24 1146 08/05/24 1810 08/05/24 2345 08/06/24 0520  NA 133* 127* 136 140  K 4.3 4.1 3.5 3.8  CL 95* 90* 100 102  CO2 24 21* 25 26  GLUCOSE 108* 183* 107* 118*  BUN 16 15 13 11   CREATININE 0.86 0.87 0.69 0.71  CALCIUM 9.5 9.3 8.2* 8.9  MG  --   --  1.6*  --   PHOS  --   --  3.1  --    Liver Function Tests: Recent Labs  Lab 08/02/24 1146 08/06/24 0520  AST 17 17  ALT 10 10  ALKPHOS 112 82  BILITOT 0.4 0.5  PROT 6.8 5.9*  ALBUMIN 4.0 3.7   Recent Labs  Lab 08/02/24 1146  LIPASE 17   No results for input(s): AMMONIA in the last 168 hours. Coagulation Profile: No results for input(s): INR, PROTIME in the last 168 hours. CBC: Recent Labs  Lab 08/02/24 1146 08/05/24 1810 08/06/24 0520  WBC 9.7 8.3 6.1  NEUTROABS 6.5  --   --   HGB 11.2* 10.6* 10.5*  HCT 35.5* 33.0* 32.7*  MCV 83.7 81.7 81.5  PLT 319 326 258   Cardiac Enzymes: No results for input(s): CKTOTAL, CKMB, CKMBINDEX, TROPONINI in the last 168 hours. BNP: Invalid input(s): POCBNP CBG: Recent Labs  Lab 08/02/24 1847 08/06/24 0738  GLUCAP 130* 146*   HbA1C: No results for input(s): HGBA1C in the last 72 hours. Urine analysis:    Component Value Date/Time   COLORURINE YELLOW 08/05/2024 1958   APPEARANCEUR CLOUDY (A) 08/05/2024 1958   APPEARANCEUR Clear 05/18/2024 1155   LABSPEC 1.004 (L) 08/05/2024 1958   PHURINE 6.0 08/05/2024 1958   GLUCOSEU NEGATIVE 08/05/2024 1958   HGBUR NEGATIVE 08/05/2024 1958   BILIRUBINUR NEGATIVE 08/05/2024 1958   BILIRUBINUR neg 07/21/2024 1039   BILIRUBINUR Negative 05/18/2024 1155   KETONESUR NEGATIVE 08/05/2024 1958   PROTEINUR 30 (A) 08/05/2024 1958   UROBILINOGEN negative (A) 07/21/2024 1039   UROBILINOGEN 0.2 12/15/2014 1956   NITRITE NEGATIVE 08/05/2024 1958   LEUKOCYTESUR LARGE (A) 08/05/2024 1958   Sepsis Labs: @LABRCNTIP (procalcitonin:4,lacticidven:4) ) Recent Results (from the past 240  hours)  Resp panel by RT-PCR (RSV, Flu A&B, Covid) Anterior Nasal Swab     Status: None   Collection Time: 08/02/24 11:46 AM   Specimen: Anterior Nasal Swab  Result Value Ref Range Status   SARS Coronavirus 2 by RT PCR NEGATIVE NEGATIVE Final    Comment: (NOTE) SARS-CoV-2 target nucleic acids are NOT DETECTED.  The SARS-CoV-2 RNA is generally detectable in upper respiratory specimens during the acute phase of infection. The lowest concentration of SARS-CoV-2 viral copies this assay can detect is 138 copies/mL. A negative result does not preclude SARS-Cov-2 infection and should not be used as the sole basis for treatment or other patient management decisions. A negative result may occur with  improper specimen collection/handling, submission of specimen other than nasopharyngeal swab, presence of viral mutation(s) within the areas targeted by this assay, and inadequate number of viral copies(<138 copies/mL). A negative result must be combined with clinical observations, patient history, and epidemiological  information. The expected result is Negative.  Fact Sheet for Patients:  bloggercourse.com  Fact Sheet for Healthcare Providers:  seriousbroker.it  This test is no t yet approved or cleared by the [place]  FDA and  has been authorized for detection and/or diagnosis of SARS-CoV-2 by FDA under an Emergency Use Authorization (EUA). This EUA will remain  in effect (meaning this test can be used) for the duration of the COVID-19 declaration under Section 564(b)(1) of the Act, 21 U.S.C.section 360bbb-3(b)(1), unless the authorization is terminated  or revoked sooner.       Influenza A by PCR NEGATIVE NEGATIVE Final   Influenza B by PCR NEGATIVE NEGATIVE Final    Comment: (NOTE) The Xpert Xpress SARS-CoV-2/FLU/RSV plus assay is intended as an aid in the diagnosis of influenza from Nasopharyngeal swab specimens and should not  be used as a sole basis for treatment. Nasal washings and aspirates are unacceptable for Xpert Xpress SARS-CoV-2/FLU/RSV testing.  Fact Sheet for Patients: bloggercourse.com  Fact Sheet for Healthcare Providers: seriousbroker.it  This test is not yet approved or cleared by the [place]  FDA and has been authorized for detection and/or diagnosis of SARS-CoV-2 by FDA under an Emergency Use Authorization (EUA). This EUA will remain in effect (meaning this test can be used) for the duration of the COVID-19 declaration under Section 564(b)(1) of the Act, 21 U.S.C. section 360bbb-3(b)(1), unless the authorization is terminated or revoked.     Resp Syncytial Virus by PCR NEGATIVE NEGATIVE Final    Comment: (NOTE) Fact Sheet for Patients: bloggercourse.com  Fact Sheet for Healthcare Providers: seriousbroker.it  This test is not yet approved or cleared by the [place]  FDA and has been authorized for detection and/or diagnosis of SARS-CoV-2 by FDA under an Emergency Use Authorization (EUA). This EUA will remain in effect (meaning this test can be used) for the duration of the COVID-19 declaration under Section 564(b)(1) of the Act, 21 U.S.C. section 360bbb-3(b)(1), unless the authorization is terminated or revoked.  Performed at [facility], [address] [place] [address]      Scheduled Meds:  ALPRAZolam   0.25 mg Oral QHS   amLODipine   10 mg Oral Daily   aspirin   81 mg Oral q morning   vitamin D3  1,000 Units Oral Daily   cycloSPORINE   1 drop Both Eyes BID   enoxaparin  (LOVENOX ) injection  40 mg Subcutaneous Q24H   gabapentin   100 mg Oral Daily   hydrALAZINE   25 mg Oral TID   insulin  aspart  0-9 Units Subcutaneous TID WC   irbesartan   75 mg Oral Daily   pantoprazole   40 mg Oral BID   sucralfate   1 g Oral TID AC   Continuous Infusions:  meropenem  (MERREM )  IV Stopped (08/05/24 2216)    Procedures/Studies: CT Angio Abd/Pel W and/or Wo Contrast Result Date: 08/02/2024 CLINICAL DATA:  89 year old female with a history of chronic mesenteric ischemia status post SMA and celiac artery stents as well as treatment of bilateral common femoral artery pseudoaneurysms. Abdominal pain and dark stools.  Assess patency of stent. EXAM: CTA ABDOMEN AND PELVIS WITHOUT AND WITH CONTRAST TECHNIQUE: Multidetector CT imaging of the abdomen and pelvis was performed using the standard protocol during bolus administration of intravenous contrast. Multiplanar reconstructed images and MIPs were obtained and reviewed to evaluate the vascular anatomy. RADIATION DOSE REDUCTION: This exam was performed according to the departmental dose-optimization program which includes automated exposure control, adjustment of the mA and/or kV according to patient size  and/or use of iterative reconstruction technique. CONTRAST:  75mL OMNIPAQUE  IOHEXOL  350 MG/ML SOLN COMPARISON:  Prior CT scan of the abdomen and pelvis 07/04/2024 FINDINGS: VASCULAR Aorta: Scattered calcified atherosclerotic plaque. No evidence of aneurysm or dissection. Celiac: Patent stent. No evidence of distal embolization. There may be some developing edge stenosis at the proximal landing zone, however this does not appear to be flow limiting. SMA: Patent stent.  No evidence of distal embolization. Renals: Solitary main renal arteries bilaterally. High-grade right and moderate left renal artery stenoses. Small accessory artery to the left lower pole. IMA: Patent without evidence of aneurysm, dissection, vasculitis or significant stenosis. Inflow: Patent without evidence of aneurysm, dissection, vasculitis or significant stenosis. Proximal Outflow: Bilateral common femoral and visualized portions of the superficial and profunda femoral arteries are patent without evidence of aneurysm, dissection, vasculitis or significant stenosis.  Resolved bilateral pseudoaneurysms. Veins: No focal venous abnormality. Review of the MIP images confirms the above findings. NON-VASCULAR Lower chest: Cardiomegaly.  No acute abnormality. Hepatobiliary: Stable micronodular appearance of the liver suggesting hepatic cirrhosis. Numerous calcified gallstones layer within the gallbladder lumen. Pancreas: Unremarkable. No pancreatic ductal dilatation or surrounding inflammatory changes. Spleen: Normal in size without focal abnormality. Adrenals/Urinary Tract: Normal adrenal glands. No hydronephrosis, nephrolithiasis or enhancing renal mass. Mild anterior malrotation of the left kidney. Moderate bladder distension. Stomach/Bowel: No focal bowel wall thickening or evidence of obstruction. Mild scattered colonic diverticula without evidence of active diverticulosis. Lymphatic: No suspicious lymphadenopathy. Reproductive: Status post hysterectomy. No adnexal masses. Other: Surgical changes of prior extensive laparoscopic ventral hernia repair with mesh and multiple helical clips. Musculoskeletal: No acute fracture or aggressive appearing lytic or blastic osseous lesion. IMPRESSION: VASCULAR 1. Widely patent celiac and SMA stents. Perhaps mild edge stenosis at the proximal landing zone of the celiac stent but this does not appear hemodynamically significant. 2. Bilateral renal artery stenoses again noted. 3. Extensive calcified atherosclerotic plaque. Aortic Atherosclerosis (ICD10-I70.0). NON-VASCULAR 1. Moderate bladder distension. This could represent a source for abdominal discomfort. 2. No evidence of bowel obstruction, ischemia or other acute abnormality. 3. Probable cirrhosis. 4. Cholelithiasis without evidence of acute cholecystitis. 5. Colonic diverticular disease without CT evidence of active inflammation. 6. Prior laparoscopic ventral hernia repair with mesh. Electronically Signed   By: Wilkie Lent M.D.   On: 08/02/2024 15:30   DG Chest 1 View Result Date:  08/02/2024 CLINICAL DATA:  Short of breath EXAM: CHEST  1 VIEW COMPARISON:  Prior chest x-ray 12/19/2023 FINDINGS: Stable cardiomegaly. Mediastinal contours are within normal limits and unchanged. Atherosclerotic calcifications present in the transverse aorta. Mild linear opacities in the lung bases favored to represent atelectasis versus scarring. Mild chronic bronchitic changes. No pulmonary edema, pleural effusion or pneumothorax. No acute osseous abnormality. IMPRESSION: 1. No acute cardiopulmonary process. 2. Stable chronic changes. Electronically Signed   By: Wilkie Lent M.D.   On: 08/02/2024 11:48    Alm Schneider, DO  [facility]  If 7PM-7AM, please contact night-coverage [email or web address] Password [email or web address] 08/06/2024, 9:12 AM   LOS: 1 day   "

## 2024-08-06 NOTE — Plan of Care (Signed)

## 2024-08-06 NOTE — Hospital Course (Signed)
 89 y.o. 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  The patient states that she has been having some trouble urinating and getting her urine out.  Notably, the patient was recently treated by her outpatient physician for UTI with cephalexin  which she finished on 08/01/2024.  She was seen in the ED on Monday (2/2) due to generalized weakness and dehydration. IV hydration was provided and patient was discharged home. She complained of progressive weakness and lack of energy since returning home from ED visit.  She denies any chest pain, shortness breath, coughing, hemoptysis, hematemesis, hematochezia, melena.  She has suprapubic pain.  She denies any headache or neck pain.  In the ED, the patient was afebrile and hemodynamically stable.  Oxygen saturation was 94-96% on room air.  Her oxygen saturation does drop to 87% on room air when she is sleeping.  Sodium 127, potassium 4.1, bicarbonate 21, serum creatinine 0.7.  WBC 8.3, hemoglobin 10.6, platelets 326.  The patient was started on IV fluids and IV Merrem .

## 2024-08-06 NOTE — TOC Initial Note (Addendum)
 Transition of Care [facility]) - Initial/Assessment Note    Patient Details  Name: [name] MRN: [id number] Date of Birth: October 02, 1931  Transition of Care [facility]) CM/SW Contact:    Ronnald MARLA Sil, RN Phone Number: 08/06/2024, 11:16 AM  Clinical Narrative:                 Patient LTC Resident of [facility], with 3 previous admissions since 03/26/24, was last discharged on 07/08/24 and admitted for Recurrent UTI and started on IV Meropenum, Inpatient Care Management (ICM) conducted chart review and contacted ALF Dir - Kyra by phone to complete brief assessment.   Kyra confirmed Patient is eligible to return to ALF, is fairly independent with baseline reliance on [employer] for mobilty, and currently established with OP PT & OT  at the ALF thru [facility].   PT eval completed today with recommendation for [facility]. Resumption of OP PT & OT will need to be added to FL2 on day of discharge so services can be restarted when patient returns to ALF.   Patient will need transportation arranged at discharge and [facility] team will continue to follow along and monitor patient advancement through interdisciplinary progression rounds. If additional patient transition needs arise, please place a TOC consult to prompt IPCM follow-up.   Expected Discharge Plan: Assisted Living Barriers to Discharge: Continued Medical Work up  Patient Goals and CMS Choice Patient states their goals for this hospitalization and ongoing recovery are:: Return to ALF with OPPT  Expected Discharge Plan and Services Post Acute Care Choice: Resumption of Svcs/PTA Provider (Resume OPPT at ALF thru [facility]) Living arrangements for the past 2 months: Assisted Living Facility [facility])                 DME Arranged: N/A  Prior Living Arrangements/Services Living arrangements for the past 2 months: Assisted Living Facility ([facility]) Lives with:: Facility Resident Do you feel safe going back to the place where you live?:  Yes       Emotional Assessment Appearance:: Appears stated age Attitude/Demeanor/Rapport: Engaged Affect (typically observed): Accepting, Stable Orientation: : Oriented to Self, Oriented to Place, Oriented to  Time, Oriented to Situation  Admission diagnosis:  UTI (urinary tract infection) [N39.0] Patient Active Problem List   Diagnosis Date Noted   Other constipation 07/08/2024   Pain of upper abdomen 07/08/2024   Chronic kidney disease, stage 3a (HCC) 07/07/2024   Generalized weakness 07/04/2024   Papule of skin 04/27/2024   DNR (do not resuscitate) discussion 04/01/2024   Occlusive mesenteric ischemia 03/31/2024   Palliative care by specialist 03/29/2024   DNR (do not resuscitate) 03/29/2024   Skin rash 03/24/2024   Lesion of nose 03/24/2024   History of Clostridioides difficile colitis 02/18/2024   Overflow diarrhea 02/18/2024   Abnormal findings on diagnostic imaging of other specified body structures 02/18/2024   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/15/2024   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 01/15/2024   Recurrent UTI 01/15/2024   Type 2 diabetes mellitus with hyperglycemia (HCC) 12/28/2023   UTI (urinary tract infection) 12/27/2023   Frequency of urination 12/24/2023   Acute blood loss anemia 04/17/2023   Gastritis and gastroduodenitis 04/10/2023   Abdominal pain, chronic, epigastric 04/08/2023   Mesenteric artery stenosis 04/07/2023   Diabetic neuropathy (HCC) 11/30/2022   S/P total knee arthroplasty, right 04/12/2022   Hypertension associated with type 2 diabetes mellitus (HCC) 02/25/2022   Aortic atherosclerosis 02/25/2022  Chronic non-seasonal allergic rhinitis 02/25/2022   GERD (gastroesophageal reflux disease) 02/17/2022   Myalgia due to statin 02/15/2020   Cognitive dysfunction 03/04/2019   Generalized anxiety disorder 06/05/2018   Hyponatremia 03/08/2018   Osteoarthritis of right knee 12/02/2017    Hyperlipidemia associated with type 2 diabetes mellitus (HCC) 10/02/2016   Insomnia 10/02/2016   Essential hypertension 12/15/2014   Type 2 diabetes mellitus with atherosclerosis of aorta (HCC) 02/15/2011   PCP:  [name], MD Pharmacy:   [facility], [place] - [address] [place] [address] Phone: [phone] Fax: [phone]  [facility] [place], [place] - [address] [place] [address] Phone: [phone] Fax: [phone]   Social Drivers of Health (SDOH) Social History: SDOH Screenings   Food Insecurity: No Food Insecurity (07/04/2024)  Housing: Low Risk (07/04/2024)  Transportation Needs: No Transportation Needs (07/04/2024)  Utilities: Not At Risk (07/04/2024)  Alcohol  Screen: Low Risk (09/06/2022)  Depression (PHQ2-9): Low Risk (07/21/2024)  Financial Resource Strain: Low Risk (09/06/2022)  Physical Activity: Sufficiently Active (09/06/2022)  Social Connections: Moderately Isolated (07/04/2024)  Stress: No Stress Concern Present (09/06/2022)  Tobacco Use: Low Risk (08/02/2024)    Readmission Risk Interventions    08/06/2024   11:06 AM 12/28/2023    5:41 PM 09/15/2023    3:04 PM  Readmission Risk Prevention Plan  Transportation Screening Complete Complete Complete  HRI or Home Care Consult   Complete  Social Work Consult for Recovery Care Planning/Counseling   Complete  Palliative Care Screening   Not Applicable  Medication Review [employer]) Complete Complete Complete  PCP or Specialist appointment within 3-5 days of discharge  Complete   HRI or Home Care Consult Complete Complete   SW Recovery Care/Counseling Consult Complete Complete   Palliative Care Screening Complete Not Applicable   Skilled Nursing Facility Complete Not Applicable

## 2024-08-06 NOTE — ED Notes (Signed)
 Pt requested several glasses of water  and was given to pt.

## 2024-08-09 ENCOUNTER — Ambulatory Visit: Admitting: Physician Assistant

## 2024-08-11 ENCOUNTER — Ambulatory Visit (INDEPENDENT_AMBULATORY_CARE_PROVIDER_SITE_OTHER): Admitting: Gastroenterology

## 2024-08-18 ENCOUNTER — Encounter (INDEPENDENT_AMBULATORY_CARE_PROVIDER_SITE_OTHER)

## 2024-09-06 ENCOUNTER — Ambulatory Visit (INDEPENDENT_AMBULATORY_CARE_PROVIDER_SITE_OTHER)

## 2024-09-14 ENCOUNTER — Ambulatory Visit (HOSPITAL_COMMUNITY): Admission: RE | Admit: 2024-09-14 | Source: Home / Self Care

## 2024-09-14 ENCOUNTER — Encounter (HOSPITAL_COMMUNITY): Admission: RE | Payer: Self-pay | Source: Home / Self Care

## 2024-09-20 ENCOUNTER — Encounter (INDEPENDENT_AMBULATORY_CARE_PROVIDER_SITE_OTHER)

## 2024-10-14 ENCOUNTER — Ambulatory Visit: Admitting: Family Medicine

## 2025-01-20 ENCOUNTER — Ambulatory Visit: Admitting: Family Medicine

## 2025-06-06 ENCOUNTER — Ambulatory Visit: Admitting: Urology
